# Patient Record
Sex: Female | Born: 1935
Health system: Southern US, Community
[De-identification: ages and names within clinical notes are randomized; demographics above are authoritative.]

## PROBLEM LIST (undated history)

## (undated) DIAGNOSIS — I8390 Asymptomatic varicose veins of unspecified lower extremity: Secondary | ICD-10-CM

## (undated) DIAGNOSIS — K449 Diaphragmatic hernia without obstruction or gangrene: Secondary | ICD-10-CM

## (undated) DIAGNOSIS — R0989 Other specified symptoms and signs involving the circulatory and respiratory systems: Secondary | ICD-10-CM

## (undated) DIAGNOSIS — I82812 Embolism and thrombosis of superficial veins of left lower extremities: Secondary | ICD-10-CM

## (undated) DIAGNOSIS — Z86718 Personal history of other venous thrombosis and embolism: Secondary | ICD-10-CM

## (undated) DIAGNOSIS — D689 Coagulation defect, unspecified: Secondary | ICD-10-CM

## (undated) DIAGNOSIS — R1319 Other dysphagia: Secondary | ICD-10-CM

## (undated) DIAGNOSIS — N185 Chronic kidney disease, stage 5: Secondary | ICD-10-CM

## (undated) DIAGNOSIS — E119 Type 2 diabetes mellitus without complications: Secondary | ICD-10-CM

## (undated) DIAGNOSIS — K5790 Diverticulosis of intestine, part unspecified, without perforation or abscess without bleeding: Secondary | ICD-10-CM

## (undated) DIAGNOSIS — IMO0002 Reserved for concepts with insufficient information to code with codable children: Secondary | ICD-10-CM

## (undated) DIAGNOSIS — M1990 Unspecified osteoarthritis, unspecified site: Secondary | ICD-10-CM

## (undated) DIAGNOSIS — J45909 Unspecified asthma, uncomplicated: Secondary | ICD-10-CM

## (undated) DIAGNOSIS — R55 Syncope and collapse: Secondary | ICD-10-CM

## (undated) DIAGNOSIS — R0600 Dyspnea, unspecified: Secondary | ICD-10-CM

## (undated) DIAGNOSIS — F419 Anxiety disorder, unspecified: Secondary | ICD-10-CM

## (undated) DIAGNOSIS — I10 Essential (primary) hypertension: Secondary | ICD-10-CM

## (undated) DIAGNOSIS — I83899 Varicose veins of unspecified lower extremities with other complications: Secondary | ICD-10-CM

## (undated) DIAGNOSIS — R51 Headache: Secondary | ICD-10-CM

## (undated) DIAGNOSIS — D649 Anemia, unspecified: Secondary | ICD-10-CM

## (undated) DIAGNOSIS — N189 Chronic kidney disease, unspecified: Secondary | ICD-10-CM

## (undated) DIAGNOSIS — M329 Systemic lupus erythematosus, unspecified: Secondary | ICD-10-CM

## (undated) DIAGNOSIS — E785 Hyperlipidemia, unspecified: Secondary | ICD-10-CM

## (undated) DIAGNOSIS — K219 Gastro-esophageal reflux disease without esophagitis: Secondary | ICD-10-CM

## (undated) DIAGNOSIS — H269 Unspecified cataract: Secondary | ICD-10-CM

## (undated) DIAGNOSIS — T7840XA Allergy, unspecified, initial encounter: Secondary | ICD-10-CM

## (undated) HISTORY — PX: CARPAL TUNNEL RELEASE: SHX101

## (undated) HISTORY — DX: Embolism and thrombosis of superficial veins of left lower extremity: I82.812

## (undated) HISTORY — PX: COLONOSCOPY: SHX174

## (undated) HISTORY — PX: UPPER GASTROINTESTINAL ENDOSCOPY: SHX188

## (undated) HISTORY — DX: Syncope and collapse: R55

## (undated) HISTORY — DX: Unspecified asthma, uncomplicated: J45.909

## (undated) HISTORY — DX: Gastro-esophageal reflux disease without esophagitis: K21.9

## (undated) HISTORY — DX: Varicose veins of unspecified lower extremity with other complications: I83.899

## (undated) HISTORY — DX: Unspecified cataract: H26.9

## (undated) HISTORY — DX: Essential (primary) hypertension: I10

## (undated) HISTORY — PX: SHOULDER OPEN ROTATOR CUFF REPAIR: SHX2407

## (undated) HISTORY — DX: Dyspnea, unspecified: R06.00

## (undated) HISTORY — PX: BREAST SURGERY: SHX581

## (undated) HISTORY — DX: Other specified symptoms and signs involving the circulatory and respiratory systems: R09.89

## (undated) HISTORY — DX: Chronic kidney disease, unspecified: N18.9

## (undated) HISTORY — DX: Diverticulosis of intestine, part unspecified, without perforation or abscess without bleeding: K57.90

## (undated) HISTORY — DX: Coagulation defect, unspecified: D68.9

## (undated) HISTORY — DX: Diaphragmatic hernia without obstruction or gangrene: K44.9

## (undated) HISTORY — PX: ABDOMINAL HYSTERECTOMY: SHX81

## (undated) HISTORY — DX: Other dysphagia: R13.19

## (undated) HISTORY — PX: BREAST EXCISIONAL BIOPSY: SUR124

## (undated) HISTORY — DX: Hyperlipidemia, unspecified: E78.5

## (undated) HISTORY — DX: Allergy, unspecified, initial encounter: T78.40XA

## (undated) HISTORY — DX: Personal history of other venous thrombosis and embolism: Z86.718

---

## 1997-10-14 ENCOUNTER — Ambulatory Visit (HOSPITAL_COMMUNITY): Admission: RE | Admit: 1997-10-14 | Discharge: 1997-10-14 | Payer: Self-pay | Admitting: Family Medicine

## 1997-11-02 ENCOUNTER — Ambulatory Visit (HOSPITAL_COMMUNITY): Admission: RE | Admit: 1997-11-02 | Discharge: 1997-11-02 | Payer: Self-pay | Admitting: Family Medicine

## 1997-12-30 ENCOUNTER — Ambulatory Visit (HOSPITAL_COMMUNITY): Admission: RE | Admit: 1997-12-30 | Discharge: 1997-12-30 | Payer: Self-pay | Admitting: Family Medicine

## 1998-06-24 ENCOUNTER — Ambulatory Visit (HOSPITAL_COMMUNITY): Admission: RE | Admit: 1998-06-24 | Discharge: 1998-06-24 | Payer: Self-pay | Admitting: Family Medicine

## 1998-12-22 ENCOUNTER — Ambulatory Visit (HOSPITAL_COMMUNITY): Admission: RE | Admit: 1998-12-22 | Discharge: 1998-12-22 | Payer: Self-pay | Admitting: Family Medicine

## 1999-05-27 ENCOUNTER — Ambulatory Visit (HOSPITAL_COMMUNITY): Admission: RE | Admit: 1999-05-27 | Discharge: 1999-05-27 | Payer: Self-pay | Admitting: Family Medicine

## 1999-05-27 ENCOUNTER — Encounter: Payer: Self-pay | Admitting: Family Medicine

## 2000-06-26 ENCOUNTER — Ambulatory Visit (HOSPITAL_COMMUNITY): Admission: RE | Admit: 2000-06-26 | Discharge: 2000-06-26 | Payer: Self-pay | Admitting: Family Medicine

## 2000-07-14 ENCOUNTER — Inpatient Hospital Stay (HOSPITAL_COMMUNITY): Admission: AD | Admit: 2000-07-14 | Discharge: 2000-07-14 | Payer: Self-pay | Admitting: *Deleted

## 2001-01-29 ENCOUNTER — Encounter: Payer: Self-pay | Admitting: Family Medicine

## 2001-01-29 ENCOUNTER — Ambulatory Visit (HOSPITAL_COMMUNITY): Admission: RE | Admit: 2001-01-29 | Discharge: 2001-01-29 | Payer: Self-pay | Admitting: Family Medicine

## 2001-08-08 ENCOUNTER — Encounter: Admission: RE | Admit: 2001-08-08 | Discharge: 2001-08-08 | Payer: Self-pay | Admitting: Family Medicine

## 2001-08-08 ENCOUNTER — Encounter: Payer: Self-pay | Admitting: Family Medicine

## 2001-11-23 ENCOUNTER — Emergency Department (HOSPITAL_COMMUNITY): Admission: EM | Admit: 2001-11-23 | Discharge: 2001-11-23 | Payer: Self-pay | Admitting: Emergency Medicine

## 2001-11-29 ENCOUNTER — Ambulatory Visit (HOSPITAL_COMMUNITY): Admission: RE | Admit: 2001-11-29 | Discharge: 2001-11-29 | Payer: Self-pay | Admitting: Family Medicine

## 2002-09-24 ENCOUNTER — Encounter: Admission: RE | Admit: 2002-09-24 | Discharge: 2002-09-24 | Payer: Self-pay | Admitting: Family Medicine

## 2002-09-24 ENCOUNTER — Encounter: Payer: Self-pay | Admitting: Family Medicine

## 2002-10-24 ENCOUNTER — Encounter: Payer: Self-pay | Admitting: Pulmonary Disease

## 2002-10-24 ENCOUNTER — Ambulatory Visit (HOSPITAL_COMMUNITY): Admission: RE | Admit: 2002-10-24 | Discharge: 2002-10-24 | Payer: Self-pay | Admitting: Pulmonary Disease

## 2002-12-12 ENCOUNTER — Encounter: Payer: Self-pay | Admitting: Pulmonary Disease

## 2002-12-12 ENCOUNTER — Ambulatory Visit (HOSPITAL_COMMUNITY): Admission: RE | Admit: 2002-12-12 | Discharge: 2002-12-12 | Payer: Self-pay | Admitting: Pulmonary Disease

## 2002-12-18 ENCOUNTER — Encounter: Payer: Self-pay | Admitting: Pulmonary Disease

## 2002-12-18 ENCOUNTER — Ambulatory Visit (HOSPITAL_COMMUNITY): Admission: RE | Admit: 2002-12-18 | Discharge: 2002-12-18 | Payer: Self-pay | Admitting: Pulmonary Disease

## 2003-03-20 ENCOUNTER — Ambulatory Visit (HOSPITAL_COMMUNITY): Admission: RE | Admit: 2003-03-20 | Discharge: 2003-03-20 | Payer: Self-pay | Admitting: *Deleted

## 2003-10-16 ENCOUNTER — Encounter: Admission: RE | Admit: 2003-10-16 | Discharge: 2003-10-16 | Payer: Self-pay | Admitting: Pulmonary Disease

## 2004-04-28 ENCOUNTER — Emergency Department (HOSPITAL_COMMUNITY): Admission: EM | Admit: 2004-04-28 | Discharge: 2004-04-28 | Payer: Self-pay | Admitting: Family Medicine

## 2004-07-05 ENCOUNTER — Ambulatory Visit (HOSPITAL_COMMUNITY): Admission: RE | Admit: 2004-07-05 | Discharge: 2004-07-05 | Payer: Self-pay | Admitting: Pulmonary Disease

## 2004-10-21 ENCOUNTER — Ambulatory Visit (HOSPITAL_COMMUNITY): Admission: RE | Admit: 2004-10-21 | Discharge: 2004-10-21 | Payer: Self-pay | Admitting: Pulmonary Disease

## 2004-10-27 ENCOUNTER — Encounter: Admission: RE | Admit: 2004-10-27 | Discharge: 2004-10-27 | Payer: Self-pay | Admitting: Pulmonary Disease

## 2005-10-10 ENCOUNTER — Ambulatory Visit (HOSPITAL_COMMUNITY): Admission: RE | Admit: 2005-10-10 | Discharge: 2005-10-10 | Payer: Self-pay | Admitting: Pulmonary Disease

## 2005-10-11 ENCOUNTER — Emergency Department (HOSPITAL_COMMUNITY): Admission: EM | Admit: 2005-10-11 | Discharge: 2005-10-11 | Payer: Self-pay | Admitting: Family Medicine

## 2005-10-24 ENCOUNTER — Ambulatory Visit (HOSPITAL_COMMUNITY): Admission: RE | Admit: 2005-10-24 | Discharge: 2005-10-24 | Payer: Self-pay | Admitting: Pulmonary Disease

## 2005-10-24 ENCOUNTER — Encounter: Payer: Self-pay | Admitting: Vascular Surgery

## 2005-11-03 ENCOUNTER — Encounter: Admission: RE | Admit: 2005-11-03 | Discharge: 2005-11-03 | Payer: Self-pay | Admitting: Pulmonary Disease

## 2006-10-15 ENCOUNTER — Encounter (INDEPENDENT_AMBULATORY_CARE_PROVIDER_SITE_OTHER): Payer: Self-pay | Admitting: Pulmonary Disease

## 2006-10-15 ENCOUNTER — Ambulatory Visit: Payer: Self-pay | Admitting: Vascular Surgery

## 2006-10-15 ENCOUNTER — Ambulatory Visit (HOSPITAL_COMMUNITY): Admission: RE | Admit: 2006-10-15 | Discharge: 2006-10-15 | Payer: Self-pay | Admitting: Pulmonary Disease

## 2006-12-17 ENCOUNTER — Encounter: Admission: RE | Admit: 2006-12-17 | Discharge: 2006-12-17 | Payer: Self-pay | Admitting: Pulmonary Disease

## 2007-06-13 ENCOUNTER — Encounter (INDEPENDENT_AMBULATORY_CARE_PROVIDER_SITE_OTHER): Payer: Self-pay | Admitting: Pulmonary Disease

## 2007-06-13 ENCOUNTER — Ambulatory Visit (HOSPITAL_COMMUNITY): Admission: RE | Admit: 2007-06-13 | Discharge: 2007-06-13 | Payer: Self-pay | Admitting: Pulmonary Disease

## 2007-09-02 ENCOUNTER — Emergency Department (HOSPITAL_COMMUNITY): Admission: EM | Admit: 2007-09-02 | Discharge: 2007-09-02 | Payer: Self-pay | Admitting: Emergency Medicine

## 2007-12-20 ENCOUNTER — Encounter: Admission: RE | Admit: 2007-12-20 | Discharge: 2007-12-20 | Payer: Self-pay | Admitting: Pulmonary Disease

## 2008-06-17 ENCOUNTER — Inpatient Hospital Stay (HOSPITAL_COMMUNITY): Admission: RE | Admit: 2008-06-17 | Discharge: 2008-06-18 | Payer: Self-pay | Admitting: Internal Medicine

## 2008-12-22 ENCOUNTER — Encounter: Admission: RE | Admit: 2008-12-22 | Discharge: 2008-12-22 | Payer: Self-pay | Admitting: Pulmonary Disease

## 2009-08-27 ENCOUNTER — Encounter: Admission: RE | Admit: 2009-08-27 | Discharge: 2009-08-27 | Payer: Self-pay | Admitting: Gastroenterology

## 2009-09-14 ENCOUNTER — Emergency Department (HOSPITAL_COMMUNITY): Admission: EM | Admit: 2009-09-14 | Discharge: 2009-09-14 | Payer: Self-pay | Admitting: Emergency Medicine

## 2009-09-15 ENCOUNTER — Ambulatory Visit: Payer: Self-pay | Admitting: Vascular Surgery

## 2009-09-15 ENCOUNTER — Encounter (INDEPENDENT_AMBULATORY_CARE_PROVIDER_SITE_OTHER): Payer: Self-pay | Admitting: Emergency Medicine

## 2009-09-15 ENCOUNTER — Observation Stay (HOSPITAL_COMMUNITY): Admission: EM | Admit: 2009-09-15 | Discharge: 2009-09-15 | Payer: Self-pay | Admitting: Emergency Medicine

## 2009-10-22 ENCOUNTER — Ambulatory Visit (HOSPITAL_COMMUNITY): Admission: RE | Admit: 2009-10-22 | Discharge: 2009-10-22 | Payer: Self-pay | Admitting: Pulmonary Disease

## 2009-11-22 ENCOUNTER — Encounter (INDEPENDENT_AMBULATORY_CARE_PROVIDER_SITE_OTHER): Payer: Self-pay | Admitting: Pulmonary Disease

## 2009-11-22 ENCOUNTER — Ambulatory Visit: Payer: Self-pay | Admitting: Vascular Surgery

## 2009-11-22 ENCOUNTER — Ambulatory Visit: Admission: RE | Admit: 2009-11-22 | Discharge: 2009-11-22 | Payer: Self-pay | Admitting: Pulmonary Disease

## 2009-12-28 ENCOUNTER — Inpatient Hospital Stay (HOSPITAL_COMMUNITY): Admission: RE | Admit: 2009-12-28 | Discharge: 2009-12-30 | Payer: Self-pay | Admitting: Orthopedic Surgery

## 2009-12-30 ENCOUNTER — Ambulatory Visit: Payer: Self-pay | Admitting: Vascular Surgery

## 2009-12-30 ENCOUNTER — Encounter (INDEPENDENT_AMBULATORY_CARE_PROVIDER_SITE_OTHER): Payer: Self-pay | Admitting: Family Medicine

## 2010-05-31 ENCOUNTER — Encounter
Admission: RE | Admit: 2010-05-31 | Discharge: 2010-05-31 | Payer: Self-pay | Source: Home / Self Care | Attending: Pulmonary Disease | Admitting: Pulmonary Disease

## 2010-06-05 ENCOUNTER — Encounter: Payer: Self-pay | Admitting: Pulmonary Disease

## 2010-07-29 LAB — CBC
HCT: 35.9 % — ABNORMAL LOW (ref 36.0–46.0)
Hemoglobin: 11.8 g/dL — ABNORMAL LOW (ref 12.0–15.0)
Platelets: 222 10*3/uL (ref 150–400)
RBC: 4.24 MIL/uL (ref 3.87–5.11)
RDW: 14.4 % (ref 11.5–15.5)

## 2010-07-29 LAB — PROTIME-INR: INR: 0.94 (ref 0.00–1.49)

## 2010-07-29 LAB — URINALYSIS, ROUTINE W REFLEX MICROSCOPIC
Bilirubin Urine: NEGATIVE
Glucose, UA: NEGATIVE mg/dL
Ketones, ur: NEGATIVE mg/dL
Specific Gravity, Urine: 1.009 (ref 1.005–1.030)
pH: 6 (ref 5.0–8.0)

## 2010-07-29 LAB — URINE CULTURE
Colony Count: NO GROWTH
Culture  Setup Time: 201108102108

## 2010-07-29 LAB — BASIC METABOLIC PANEL
BUN: 14 mg/dL (ref 6–23)
Chloride: 101 mEq/L (ref 96–112)

## 2010-07-29 LAB — DIFFERENTIAL
Lymphocytes Relative: 27 % (ref 12–46)
Neutro Abs: 3.4 10*3/uL (ref 1.7–7.7)

## 2010-07-29 LAB — SURGICAL PCR SCREEN: MRSA, PCR: NEGATIVE

## 2010-08-02 LAB — POCT I-STAT, CHEM 8
BUN: 17 mg/dL (ref 6–23)
Calcium, Ion: 1.24 mmol/L (ref 1.12–1.32)
Chloride: 104 mEq/L (ref 96–112)
Creatinine, Ser: 1.1 mg/dL (ref 0.4–1.2)
Glucose, Bld: 111 mg/dL — ABNORMAL HIGH (ref 70–99)
Sodium: 144 mEq/L (ref 135–145)

## 2010-08-29 LAB — CBC
HCT: 38.5 % (ref 36.0–46.0)
MCHC: 32.5 g/dL (ref 30.0–36.0)
Platelets: 204 10*3/uL (ref 150–400)
RBC: 4.35 MIL/uL (ref 3.87–5.11)
WBC: 5.1 10*3/uL (ref 4.0–10.5)

## 2010-08-29 LAB — BASIC METABOLIC PANEL: Chloride: 101 mEq/L (ref 96–112)

## 2010-09-27 NOTE — Op Note (Signed)
NAME:  [name], [name]              ACCOUNT NO.:  [id number]   MEDICAL RECORD NO.:  [id number]          PATIENT TYPE:  AMB   LOCATION:  SDS                          FACILITY:  [facility]   PHYSICIAN:  Anderson Malta, M.D.    DATE OF BIRTH:  06/03/1933   DATE OF PROCEDURE:  DATE OF DISCHARGE:                               OPERATIVE REPORT   PREOPERATIVE DIAGNOSES:  Right shoulder bursitis, rotator cuff tear,  synovitis.   POSTOPERATIVE DIAGNOSES:  Right shoulder bursitis, rotator cuff tear,  synovitis.   PROCEDURE:  Right shoulder diagnostic arthroscopy with limited  intraarticular debridement of rotator cuff tear, labral fraying, and  early synovitis within rotator cuff space and superior to the biceps  anchor with some debridement of some anterior labral degeneration and  subacromial decompression, mini open rotator cuff repair.   SURGEON:  Anderson Malta, M.D.   ASSISTANT:  None.   ANESTHESIA:  General endotracheal.   ESTIMATED BLOOD LOSS:  Minimal.   INDICATIONS:  Mr. [name] is a 77 year old female with persistent  right shoulder pain which has failed nonoperative management.  She  presents now for operative therapy of rotator cuff tear and bursitis  after explaining risks and benefits.   OPERATIVE FINDINGS:  1. Examination under anesthesia, range of motion, forward flexion 170,      external rotation 15 degrees, abduction 70, isolated glenohumeral      abduction 120.  2. Diagnostic operative arthroscopy.      a.     Frayed tissue around the anterior aspect of the biceps       anchor with stable biceps attachment to the glenoid.      b.     Early synovitis superior to the biceps anchor within rotator       interval.      c.     Intact biceps tendon.      d.     Cyst within superior aspect of the intrarticular       subscapularis tendon.      e.     Subacromial bursitis and AC inferior joint osteophytes and       type 2 acromion.      f.     A 2 cm x 1.5 cm rotator  cuff tear of the bleeding edges of       supraspinatus.   PROCEDURE IN DETAIL:  The patient was brought to the operative room  where general endotracheal anesthesia was induced.  Perioperative  antibiotics were administered.  A time-out was called.  The patient was  placed in beach chair position with head in the neutral position.  Right  shoulder was prescrubbed with alcohol and Betadine which allowed to air  dry, and then the entire arm with shoulder was prepped with DuraPrep  solution.  The topographic anatomy showed a dense line including  posterolateral, negative margins, and acromion as well as coracoid  process.  Charlie Pitter was used to cover the axilla.  A solution of saline and  epinephrine was injected in the subacromial space.  Posterior portal was  then created at  2-cm medial and inferior to the posterior margin of  acromion.  Diagnostic arthroscopy was performed.  The patient did have  some fraying and synovitis around the biceps anchor.  This was debrided  with the shaver and ArthroCare wand.  The glenohumeral articular surface  was intact, biceps anchor was intact.  The patient did have some cyst  within the superior aspect of the intraarticular subscapularis.  This  was decompressed after creation of an anterior portal.  Under direct  visualization, limited debridement was performed of the rotator cuff  tear, the supraspinatus as well as the labral degeneration and synovitis  within rotator cuff interval.  At this time, the scope was placed into  the subacromial space.  Anterior portal was then created in line with  the anticipated location of the incision for mini open rotator cuff  repair.  Bursectomy with subacromial decompression was performed with co-  planing of the clavicle.  Rotator cuff tear was again visualized.   At this time, following subacromial decompression, the instruments were  removed.  The anterior and posterior portals were closed using 3-0 nylon  suture.   Shoulder was reprepped with DuraPrep and an India was used to  cover the rest of the shoulder.  The incision was then made measuring  2.5 cm.  Deltoid was split, measured distance 3-cm from anterolateral  margin of the acromion.  Subacromial decompression was adequate to  manual palpation.  The rotator cuff tear was visualized.  The edges were  fashioned with #15 blade, footprint was fashioned with the curette.  The  watertight repair was then achieved on the rotator cuff tear using by  first closing the V with inverted 2-0 FiberWire suture, then using two  corkscrews and two PushLocks to obtain a watertight repair and pressed  the rotator cuff down to the footprint of the greater tuberosity.  Obtained good range of motion and found to have no grinding or crepitus.  At this time, the incision was thoroughly irrigated.  Deltoid split was  closed using 0 Vicryl suture followed by interrupted inverted 2-0 Vicryl  suture and running 3-0, 4-0 Prolene.  Bulky dressing was then applied to  the shoulder along with shoulder immobilizer.  The patient tolerated the  procedure well without immediate complications.  She was then extubated  and transferred to recovery room.      Anderson Malta, M.D.  Electronically Signed     GSD/MEDQ  D:  06/16/2008  T:  06/17/2008  Job:  [id number]

## 2010-09-30 NOTE — Op Note (Signed)
   NAME:  [name], [name]                        ACCOUNT NO.:  [id number]   MEDICAL RECORD NO.:  [id number]                   PATIENT TYPE:  AMB   LOCATION:  ENDO                                 [facility]ACILITY[facility]  [facility]   PHYSICIAN:  Waverly Ferrari, M.D.                 DATE OF BIRTH:  02-24-1934   DATE OF PROCEDURE:  DATE OF DISCHARGE:                                 OPERATIVE REPORT   PROCEDURE:  Colonoscopy.   INDICATIONS FOR PROCEDURE:  Question of colon polyp seen on barium enema.   ANESTHESIA:  Demerol 25, Versed 4 mg.   DESCRIPTION OF PROCEDURE:  With the patient mildly sedated in the left  lateral decubitus position subsequently rolled to her back to the her right  side and back again in a number of different positions with pressure applied  to the abdomen. The Olympus videoscopic colonoscope was inserted in the  rectum and passed under direct vision to the ascending colon just above the  ileocecal valve. The cecum could be partially visualized and it appeared  normal and after many attempts to try to actually get the endoscope to  advance into the cecum finally felt this was not going to be possible due to  adhesions and looping in the pelvis which could not be reduce. Therefore and  since the patient had already had a barium enema that showed this area to be  negative, I decided to withdraw the colonoscope taking circumferential views  of the remaining colonic mucosa stopping only in the rectum which appeared  normal on direct and showed hemorrhoids on retroflexed view. The endoscope  was straightened and withdraw. The patient's vital signs and pulse oximeter  remained stable. The patient tolerated the procedure well without apparent  complications.   FINDINGS:  At this point no polyps or filling defects were seen. There were  some diverticula seen in the sigmoid colon and distal colon which may have  been what we had seen on barium enema. There were no polyps or other  abnormalities noted on the exam as outlined above.   PLAN:  Possibly repeat examination in 5-10 years.                                               Waverly Ferrari, M.D.    GMO/MEDQ  D:  03/20/2003  T:  03/20/2003  Job:  [id number]   cc:   Leola Brazil., M.D.  [place]. [address] [place]  [place] [address]  Fax: [phone]

## 2010-09-30 NOTE — Op Note (Signed)
   NAME:  [name], [name]                        ACCOUNT NO.:  [id number]   MEDICAL RECORD NO.:  [id number]                   PATIENT TYPE:  AMB   LOCATION:  ENDO                                 [facility]ACILITY:  [facility]   PHYSICIAN:  Waverly Ferrari, M.D.                 DATE OF BIRTH:  1933-06-05   DATE OF PROCEDURE:  03/20/2003  DATE OF DISCHARGE:                                 OPERATIVE REPORT   PROCEDURE:  Upper endoscopy.   INDICATIONS:  Gastroesophageal reflux disease.   ANESTHESIA:  Demerol 50 mg, Versed 5 mg.   DESCRIPTION OF PROCEDURE:  With the patient mildly sedated in the left  lateral decubitus position, the Olympus videoscopic endoscope was inserted  in the mouth and passed under direct vision through the esophagus, which  appeared normal.  There was no evidence of Barrett's esophagus. We entered  into the stomach.  The  fundus, body, antrum, duodenal bulb, and second  portion of the duodenum all appeared normal.  From this point, the endoscope  was slowly withdrawn taking circumferential views of the duodenal mucosa  until the endoscope then pulled back into the stomach, placed in  retroflexion and viewed the stomach from below.  The endoscope was then  straightened, withdrawn, taking circumferential views of the remaining  gastric and esophageal mucosa.  The patient's vital signs and pulse oximetry  remained stable.  The patient tolerated the procedure well without apparent  complication.   FINDINGS:  Unremarkable examination.   PLAN:  Proceed to colonoscopy                                               Waverly Ferrari, M.D.    GMO/MEDQ  D:  03/20/2003  T:  03/20/2003  Job:  [id number]   cc:   Leola Brazil., M.D.  [place]. [address] [place]  [place] [address]  Fax: [phone]

## 2010-09-30 NOTE — Discharge Summary (Signed)
NAME:  CYNNAMON, KENTER              ACCOUNT NO.:  [id number]   MEDICAL RECORD NO.:  [id number]          PATIENT TYPE:  INP   LOCATION:  [id number]                         FACILITY:  [facility]   PHYSICIAN:  Anderson Malta, M.D.    DATE OF BIRTH:  Jul 13, 1933   DATE OF ADMISSION:  06/16/2008  DATE OF DISCHARGE:  06/18/2008                               DISCHARGE SUMMARY   ADMISSION DIAGNOSES:  1. Right shoulder bursitis, rotator cuff tear, and synovitis.  2. Asthma.  3. Hypertension.   DISCHARGE DIAGNOSES:  1. Right shoulder bursitis, rotator cuff tear, and synovitis.  2. Asthma.  3. Hypertension.   PROCEDURE:  On June 16, 2008, the patient underwent right shoulder  diagnostic arthroscopy with limited intra-articular debridement of  rotator cuff tear, labral fraying and early synovitis within rotator  cuff space and superior to the biceps anchor with some debridement of  anterior labral degeneration and subacromial decompression.  Many open  rotator cuff repair.  This was performed by Dr. Marlou Sa under general  anesthesia.   CONSULTATIONS:  None.   BRIEF HISTORY:  The patient is a 77 year old female with persistent  right shoulder pain failing nonoperative management.  She presents for a  rotator cuff repair via right shoulder arthroscopy.   BRIEF HOSPITAL COURSE:  The patient tolerated the procedure under  general anesthesia without complications.  Postoperatively, physical  therapy assisted in ambulation and gait training and she was independent  in ambulation.  Occupational therapy assisted with ADL and sling  education, as well as pendulum exercises, active range of motion of the  elbow, wrist, and hand, and active assisted range of motion, forward  flexion without limitations of the right upper extremity.  Sling was  removed for occupational therapy and ADL exercises but worn otherwise  full time.  Dressing changed on the second postoperative day prior to  discharge, and her wound  was without drainage.  Pain was initially  treated with IV analgesics, and she was gradually weaned to Vicodin for  her discomfort.  The patient was taking a regular diet prior to  discharge.  She was afebrile, and vital signs were stable.   PERTINENT LABORATORY VALUES ON ADMISSION:  CBC within normal limits.  Chemistry studies within normal limits.   PLAN:  The patient was discharged to her home.  Family will assist with  her care.  She was given a medication reconciliation form with  instructions to continue home medications as taken prior to admission  and given prescriptions for Vicodin 1 every 4-6 hours as needed for pain  and Robaxin 500 mg 1 every 8 hours as needed for spasm.  She will keep  her wound dry and clean, which is covered with Mepilex at the time of  discharge.  She will  wear her sling at all times with the exception of therapeutic exercises  2 hours per day.  The patient will follow up with Dr. Marlou Sa on June 22, 2008.  All questions encouraged and answered.   CONDITION ON DISCHARGE:  Stable.      Epimenio Foot, P.A.  Anderson Malta, M.D.  Electronically Signed    SMV/MEDQ  D:  09/03/2008  T:  09/03/2008  Job:  [id number]

## 2010-10-17 ENCOUNTER — Ambulatory Visit (HOSPITAL_COMMUNITY)
Admission: RE | Admit: 2010-10-17 | Discharge: 2010-10-17 | Disposition: A | Discharge status: Home / Self Care | Payer: Medicare Other | Source: Ambulatory Visit | Attending: Pulmonary Disease | Admitting: Pulmonary Disease

## 2010-10-17 ENCOUNTER — Other Ambulatory Visit: Payer: Self-pay | Admitting: Pulmonary Disease

## 2010-10-17 DIAGNOSIS — M519 Unspecified thoracic, thoracolumbar and lumbosacral intervertebral disc disorder: Secondary | ICD-10-CM | POA: Insufficient documentation

## 2010-10-17 DIAGNOSIS — M47817 Spondylosis without myelopathy or radiculopathy, lumbosacral region: Secondary | ICD-10-CM | POA: Insufficient documentation

## 2010-10-17 DIAGNOSIS — M159 Polyosteoarthritis, unspecified: Secondary | ICD-10-CM

## 2010-10-17 DIAGNOSIS — M545 Low back pain, unspecified: Secondary | ICD-10-CM | POA: Insufficient documentation

## 2010-10-18 ENCOUNTER — Ambulatory Visit (HOSPITAL_COMMUNITY): Payer: Medicare Other

## 2010-10-19 ENCOUNTER — Ambulatory Visit (HOSPITAL_COMMUNITY)
Admission: RE | Admit: 2010-10-19 | Discharge: 2010-10-19 | Disposition: A | Discharge status: Home / Self Care | Payer: Medicare Other | Source: Ambulatory Visit | Attending: Pulmonary Disease | Admitting: Pulmonary Disease

## 2010-10-19 DIAGNOSIS — M79609 Pain in unspecified limb: Secondary | ICD-10-CM | POA: Insufficient documentation

## 2010-10-19 DIAGNOSIS — M7989 Other specified soft tissue disorders: Secondary | ICD-10-CM | POA: Insufficient documentation

## 2010-10-19 DIAGNOSIS — I82890 Acute embolism and thrombosis of other specified veins: Secondary | ICD-10-CM | POA: Insufficient documentation

## 2010-12-05 ENCOUNTER — Encounter: Payer: Self-pay | Admitting: Vascular Surgery

## 2010-12-05 DIAGNOSIS — I83899 Varicose veins of unspecified lower extremities with other complications: Secondary | ICD-10-CM

## 2010-12-05 HISTORY — DX: Varicose veins of unspecified lower extremity with other complications: I83.899

## 2010-12-26 ENCOUNTER — Encounter: Payer: Self-pay | Admitting: Vascular Surgery

## 2011-01-02 ENCOUNTER — Ambulatory Visit (INDEPENDENT_AMBULATORY_CARE_PROVIDER_SITE_OTHER): Payer: Medicare Other | Admitting: Vascular Surgery

## 2011-01-02 ENCOUNTER — Encounter (INDEPENDENT_AMBULATORY_CARE_PROVIDER_SITE_OTHER): Payer: Medicare Other

## 2011-01-02 VITALS — BP 151/62 | HR 63 | Resp 20 | Ht 62.0 in | Wt 185.0 lb

## 2011-01-02 DIAGNOSIS — I83893 Varicose veins of bilateral lower extremities with other complications: Secondary | ICD-10-CM

## 2011-01-02 NOTE — Progress Notes (Deleted)
Subjective:     Patient ID: [name], female   DOB: Jan 15, 1934, 77 y.o.   MRN: [id number]  HPI   Review of Systems  Constitutional: Negative.   HENT: Negative.   Eyes: Negative.   Respiratory: Positive for shortness of breath.   Cardiovascular: Negative.   Gastrointestinal: Negative.   Genitourinary: Negative.   Musculoskeletal: Negative.   Skin: Negative.   Neurological: Negative.   Hematological: Negative.   Psychiatric/Behavioral: Negative.        Objective:   Physical Exam     Assessment:     ***    Plan:     ***

## 2011-01-02 NOTE — Progress Notes (Signed)
Subjective:     Patient ID: [name], female   DOB: [birth year], 77 y.o.   MRN: [id number]  HPI this 77 year old female was referred for venous evaluation. She has a history of thrombophlebitis in the right leg on 2 occasions. About one year ago apparently she did have what sounds like thrombophlebitis in her right calf and was treated with anticoagulants-heparin and Coumadin. She is currently not on Coumadin. She has a remote history of a DVT in one of her legs and was treated with anticoagulants. She does have swelling in both ankles as the day progresses and is not were last compression stockings nor elevate her leg. She has no history of stasis ulcers bleeding bulging varicosities.  Past Medical History  Diagnosis Date  . Asthma   . History of DVT (deep vein thrombosis)   . Allergy   . GERD (gastroesophageal reflux disease)   . Hyperlipidemia   . Hypertension     History  Substance Use Topics  . Smoking status: Never Smoker   . Smokeless tobacco: Not on file  . Alcohol Use: No    No family history on file.  Allergies  Allergen Reactions  . Aspirin   . Penicillins     Current outpatient prescriptions:albuterol (PROVENTIL) (2.5 MG/3ML) 0.083% nebulizer solution, Take 2.5 mg by nebulization every 6 (six) hours as needed. 1-2 puffs as needed , Disp: , Rfl: ;  clopidogrel (PLAVIX) 75 MG tablet, Take 75 mg by mouth daily.  , Disp: , Rfl: ;  famotidine (PEPCID) 40 MG tablet, Take 40 mg by mouth daily.  , Disp: , Rfl: ;  fenofibrate 160 MG tablet, Take 160 mg by mouth daily.  , Disp: , Rfl:  ipratropium (ATROVENT) 0.02 % nebulizer solution, Take 500 mcg by nebulization every 8 (eight) hours as needed. For spasm , Disp: , Rfl: ;  losartan-hydrochlorothiazide (HYZAAR) 100-25 MG per tablet, Take 1 tablet by mouth daily.  , Disp: , Rfl: ;  metoprolol (LOPRESSOR) 50 MG tablet, Take 50 mg by mouth 1 day or 1 dose.  , Disp: , Rfl: ;  montelukast (SINGULAIR) 10 MG tablet, Take 10 mg by mouth  1 day or 1 dose.  , Disp: , Rfl:  verapamil (COVERA HS) 240 MG (CO) 24 hr tablet, Take 240 mg by mouth at bedtime.  , Disp: , Rfl: ;  hydrochlorothiazide 25 MG tablet, Take 25 mg by mouth daily.  , Disp: , Rfl: ;  HYDROcodone-acetaminophen (NORCO) 10-325 MG per tablet, Take 1 tablet by mouth every 6 (six) hours as needed. One half to 2 tabs po q 6h prn pain , Disp: , Rfl:   Filed Vitals:   01/02/11 1449  Height: 5\' 2"  (1.575 m)  Weight: 185 lb (83.915 kg)    Body mass index is 33.84 kg/(m^2).         Review of Systems  Constitutional: Negative for fever, chills, activity change, appetite change, fatigue and unexpected weight change.  HENT: Negative for hearing loss, congestion, sore throat, trouble swallowing, neck pain and voice change.   Eyes: Negative for visual disturbance.  Respiratory: Positive for shortness of breath and wheezing. Negative for cough, chest tightness and stridor.   Cardiovascular: Negative for chest pain, palpitations and leg swelling.  Gastrointestinal: Negative for nausea, abdominal pain, diarrhea, constipation, blood in stool and abdominal distention.  Genitourinary: Negative for dysuria, urgency, frequency, hematuria and difficulty urinating.  Musculoskeletal: Negative for back pain, joint swelling and gait problem.  Skin: Negative for  color change, pallor, rash and wound.  Neurological: Negative for dizziness, seizures, syncope, facial asymmetry, speech difficulty, weakness, light-headedness, numbness and headaches.  Hematological: Negative for adenopathy. Does not bruise/bleed easily.  Psychiatric/Behavioral: Negative for confusion.       Objective:   Physical Exam  Constitutional: She is oriented to person, place, and time. She appears well-developed and well-nourished.  HENT:  Head: Normocephalic.  Nose: Nose normal.  Mouth/Throat: Oropharynx is clear and moist.  Eyes: EOM are normal. Pupils are equal, round, and reactive to light. No scleral  icterus.  Neck: Normal range of motion. Neck supple. No JVD present.  Cardiovascular: Normal rate, regular rhythm, normal heart sounds and intact distal pulses.   No murmur heard.      Both lower extremities have 1+ edema. She has 3+ femoral popliteal and dorsalis pedis pulses palpable bilaterally. She has multiple reticular and spider veins are carefully below the knees and ankle areas. There is an area of resolving thrombophlebitis in the right posterior calf just below the popliteal fossa. No bulging varicosities are noted in the thighs.  Pulmonary/Chest: Effort normal and breath sounds normal. No stridor. No respiratory distress. She has no wheezes. She has no rales.  Abdominal: Soft. She exhibits no distension and no mass. There is no tenderness. There is no rebound and no guarding.  Musculoskeletal: Normal range of motion. She exhibits no edema and no tenderness.  Lymphadenopathy:    She has no cervical adenopathy.  Neurological: She is alert and oriented to person, place, and time. Coordination normal.  Skin: Skin is warm and dry. No rash noted. No erythema.  Psychiatric: She has a normal mood and affect. Her behavior is normal.       Assessment:     Today he ordered a venous duplex exam of both lower extremities which are reviewed and interpreted. There is no DVT in either leg. There is evidence of resolving thrombophlebitis in the right posterior calf. There is some mild reflux in the left great saphenous system although the vein is quite small. The right great saphenous vein is free reflux. There is a Baker's cyst in the left popliteal fossa. The patient does have some mild diffuse venous disease and has a history of bilateral thrombophlebitis. I do not think she needs any active treatment for her venous disease at this time.    Plan:     Short-leg lower extremity stockings (20-30 mm). Elevate legs at night No further recommendations at this time

## 2011-01-10 NOTE — Procedures (Unsigned)
LOWER EXTREMITY VENOUS REFLUX EXAM  INDICATION:  Painful varicose veins with a history of thrombus.  EXAM:  Using color-flow imaging and pulse Doppler spectral analysis, the bilateral common femoral, femoral, popliteal, posterior tibial, great and small saphenous veins were evaluated.  There is no evidence suggesting deep venous insufficiency in the bilateral lower extremities.  The bilateral saphenofemoral junction is competent.  The left GSV is not competent with reflux at >500 milliseconds with the caliber as described below.  The right GSV appears competent, however, it feeds multiple varicosities.  The left proximal small saphenous vein cannot be visualized due to a large Baker's cyst.  The right small saphenous vein appears competent, however, there are noncompressible varicosities in the area.  GSV Diameter (used if found to be incompetent only)                                           Right    Left Proximal Greater Saphenous Vein           0.36 cm  0.46 cm Proximal-to-mid-thigh                     0.42 cm  0.52 cm Mid thigh                                 0.34 cm  0.20 cm Mid-distal thigh                          cm       cm Distal thigh                              0.32 cm  0.19 cm Knee                                      0.36 cm  0.19 cm  IMPRESSION: 1. Left great saphenous vein is not competent with reflux of >500     milliseconds. 2. The left great saphenous vein is not tortuous. 3. The deep venous system is competent. 4. The left small saphenous vein could not be visualized due to     Baker's cyst. 5. The right great saphenous vein appears competent with multiple     varicose branches. 6. The right small saphenous vein is competent with noncompressible     varicosities. 7. Superficial venous thrombus in the posterior calf varicosities of     the small saphenous vein.       ___________________________________________ Nelda Severe. Kellie Simmering,  M.D.  LT/MEDQ  D:  01/03/2011  T:  01/03/2011  Job:  [id number]

## 2011-02-15 ENCOUNTER — Encounter: Payer: Self-pay | Admitting: *Deleted

## 2011-02-15 ENCOUNTER — Ambulatory Visit (HOSPITAL_COMMUNITY)
Admission: RE | Admit: 2011-02-15 | Discharge: 2011-02-15 | Disposition: A | Discharge status: Home / Self Care | Payer: Medicare Other | Source: Ambulatory Visit | Attending: Pulmonary Disease | Admitting: Pulmonary Disease

## 2011-02-15 ENCOUNTER — Other Ambulatory Visit (HOSPITAL_COMMUNITY): Payer: Self-pay | Admitting: Pulmonary Disease

## 2011-02-15 DIAGNOSIS — R42 Dizziness and giddiness: Secondary | ICD-10-CM

## 2011-02-15 DIAGNOSIS — R9431 Abnormal electrocardiogram [ECG] [EKG]: Secondary | ICD-10-CM

## 2011-02-15 DIAGNOSIS — I6529 Occlusion and stenosis of unspecified carotid artery: Secondary | ICD-10-CM | POA: Insufficient documentation

## 2011-02-16 ENCOUNTER — Ambulatory Visit (HOSPITAL_COMMUNITY): Payer: Medicare Other | Attending: Pulmonary Disease

## 2011-02-16 DIAGNOSIS — R9431 Abnormal electrocardiogram [ECG] [EKG]: Secondary | ICD-10-CM | POA: Insufficient documentation

## 2011-02-16 DIAGNOSIS — I10 Essential (primary) hypertension: Secondary | ICD-10-CM | POA: Insufficient documentation

## 2011-02-17 ENCOUNTER — Encounter (HOSPITAL_COMMUNITY): Payer: Self-pay | Admitting: Pulmonary Disease

## 2011-05-02 ENCOUNTER — Other Ambulatory Visit: Payer: Self-pay | Admitting: Pulmonary Disease

## 2011-05-02 DIAGNOSIS — Z1231 Encounter for screening mammogram for malignant neoplasm of breast: Secondary | ICD-10-CM

## 2011-06-02 ENCOUNTER — Ambulatory Visit: Payer: Medicare Other

## 2011-07-04 ENCOUNTER — Ambulatory Visit
Admission: RE | Admit: 2011-07-04 | Discharge: 2011-07-04 | Disposition: A | Discharge status: Home / Self Care | Payer: Medicare Other | Source: Ambulatory Visit | Attending: Pulmonary Disease | Admitting: Pulmonary Disease

## 2011-07-04 DIAGNOSIS — Z1231 Encounter for screening mammogram for malignant neoplasm of breast: Secondary | ICD-10-CM

## 2012-07-05 ENCOUNTER — Other Ambulatory Visit: Payer: Self-pay | Admitting: Pulmonary Disease

## 2012-07-05 DIAGNOSIS — Z1231 Encounter for screening mammogram for malignant neoplasm of breast: Secondary | ICD-10-CM

## 2012-08-02 ENCOUNTER — Ambulatory Visit: Payer: Medicare Other

## 2012-08-05 ENCOUNTER — Ambulatory Visit
Admission: RE | Admit: 2012-08-05 | Discharge: 2012-08-05 | Disposition: A | Discharge status: Home / Self Care | Payer: Medicare Other | Source: Ambulatory Visit | Attending: Pulmonary Disease | Admitting: Pulmonary Disease

## 2012-08-05 DIAGNOSIS — Z1231 Encounter for screening mammogram for malignant neoplasm of breast: Secondary | ICD-10-CM

## 2012-09-08 ENCOUNTER — Observation Stay (HOSPITAL_COMMUNITY)
Admission: EM | Admit: 2012-09-08 | Discharge: 2012-09-09 | Disposition: A | Discharge status: Home / Self Care | Payer: Medicare Other | Attending: Internal Medicine | Admitting: Internal Medicine

## 2012-09-08 ENCOUNTER — Encounter (HOSPITAL_COMMUNITY): Payer: Self-pay | Admitting: *Deleted

## 2012-09-08 ENCOUNTER — Encounter (HOSPITAL_COMMUNITY): Payer: Self-pay

## 2012-09-08 ENCOUNTER — Emergency Department (HOSPITAL_COMMUNITY)
Admission: EM | Admit: 2012-09-08 | Discharge: 2012-09-08 | Disposition: A | Discharge status: Nursing Home (Includes ICF) | Payer: Medicare Other | Source: Home / Self Care | Attending: Family Medicine | Admitting: Family Medicine

## 2012-09-08 ENCOUNTER — Emergency Department (HOSPITAL_COMMUNITY): Payer: Medicare Other

## 2012-09-08 DIAGNOSIS — F329 Major depressive disorder, single episode, unspecified: Secondary | ICD-10-CM | POA: Diagnosis present

## 2012-09-08 DIAGNOSIS — R0789 Other chest pain: Secondary | ICD-10-CM

## 2012-09-08 DIAGNOSIS — J45909 Unspecified asthma, uncomplicated: Secondary | ICD-10-CM | POA: Diagnosis present

## 2012-09-08 DIAGNOSIS — K219 Gastro-esophageal reflux disease without esophagitis: Secondary | ICD-10-CM | POA: Diagnosis present

## 2012-09-08 DIAGNOSIS — Z86718 Personal history of other venous thrombosis and embolism: Secondary | ICD-10-CM | POA: Insufficient documentation

## 2012-09-08 DIAGNOSIS — I10 Essential (primary) hypertension: Secondary | ICD-10-CM

## 2012-09-08 DIAGNOSIS — Z7902 Long term (current) use of antithrombotics/antiplatelets: Secondary | ICD-10-CM | POA: Insufficient documentation

## 2012-09-08 DIAGNOSIS — E785 Hyperlipidemia, unspecified: Secondary | ICD-10-CM | POA: Diagnosis present

## 2012-09-08 DIAGNOSIS — F32A Depression, unspecified: Secondary | ICD-10-CM | POA: Diagnosis present

## 2012-09-08 DIAGNOSIS — F3289 Other specified depressive episodes: Secondary | ICD-10-CM | POA: Insufficient documentation

## 2012-09-08 DIAGNOSIS — R079 Chest pain, unspecified: Secondary | ICD-10-CM

## 2012-09-08 HISTORY — DX: Reserved for concepts with insufficient information to code with codable children: IMO0002

## 2012-09-08 HISTORY — DX: Unspecified osteoarthritis, unspecified site: M19.90

## 2012-09-08 HISTORY — DX: Systemic lupus erythematosus, unspecified: M32.9

## 2012-09-08 HISTORY — DX: Unspecified asthma, uncomplicated: J45.909

## 2012-09-08 HISTORY — DX: Essential (primary) hypertension: I10

## 2012-09-08 HISTORY — DX: Asymptomatic varicose veins of unspecified lower extremity: I83.90

## 2012-09-08 HISTORY — DX: Headache: R51

## 2012-09-08 LAB — CBC WITH DIFFERENTIAL/PLATELET
Eosinophils Relative: 4 % (ref 0–5)
HCT: 32.1 % — ABNORMAL LOW (ref 36.0–46.0)
Lymphocytes Relative: 34 % (ref 12–46)
Lymphs Abs: 1.4 10*3/uL (ref 0.7–4.0)
MCV: 81.5 fL (ref 78.0–100.0)
Monocytes Absolute: 0.3 10*3/uL (ref 0.1–1.0)
Neutro Abs: 2.3 10*3/uL (ref 1.7–7.7)
RBC: 3.94 MIL/uL (ref 3.87–5.11)
WBC: 4.2 10*3/uL (ref 4.0–10.5)

## 2012-09-08 LAB — POCT I-STAT TROPONIN I: Troponin i, poc: 0 ng/mL (ref 0.00–0.08)

## 2012-09-08 LAB — BASIC METABOLIC PANEL
CO2: 30 mEq/L (ref 19–32)
Calcium: 10 mg/dL (ref 8.4–10.5)
Chloride: 105 mEq/L (ref 96–112)
Creatinine, Ser: 1.33 mg/dL — ABNORMAL HIGH (ref 0.50–1.10)
Glucose, Bld: 105 mg/dL — ABNORMAL HIGH (ref 70–99)
Sodium: 142 mEq/L (ref 135–145)

## 2012-09-08 LAB — RETICULOCYTES: Retic Count, Absolute: 57.3 10*3/uL (ref 19.0–186.0)

## 2012-09-08 MED ORDER — SODIUM CHLORIDE 0.9 % IJ SOLN
3.0000 mL | Freq: Two times a day (BID) | INTRAMUSCULAR | Status: DC
  Administered 2012-09-09: 3 mL via INTRAVENOUS

## 2012-09-08 MED ORDER — MONTELUKAST SODIUM 10 MG PO TABS
10.0000 mg | ORAL_TABLET | Freq: Every morning | ORAL | Status: DC
  Administered 2012-09-09: 10 mg via ORAL
  Filled 2012-09-08: qty 1

## 2012-09-08 MED ORDER — SODIUM CHLORIDE 0.9 % IV SOLN: 250.0000 mL | INTRAVENOUS | Status: DC | PRN

## 2012-09-08 MED ORDER — LABETALOL HCL 5 MG/ML IV SOLN
20.0000 mg | Freq: Once | INTRAVENOUS | Status: AC
  Administered 2012-09-08: 20 mg via INTRAVENOUS
  Filled 2012-09-08: qty 4

## 2012-09-08 MED ORDER — ACETAMINOPHEN 325 MG PO TABS
650.0000 mg | ORAL_TABLET | Freq: Four times a day (QID) | ORAL | Status: DC | PRN
  Administered 2012-09-08 – 2012-09-09 (×2): 650 mg via ORAL
  Filled 2012-09-08: qty 2
  Filled 2012-09-08: qty 1

## 2012-09-08 MED ORDER — SODIUM CHLORIDE 0.9 % IJ SOLN
3.0000 mL | Freq: Two times a day (BID) | INTRAMUSCULAR | Status: DC
  Administered 2012-09-08: 3 mL via INTRAVENOUS

## 2012-09-08 MED ORDER — FAMOTIDINE 40 MG PO TABS
40.0000 mg | ORAL_TABLET | Freq: Two times a day (BID) | ORAL | Status: DC
  Administered 2012-09-09 (×2): 40 mg via ORAL
  Filled 2012-09-08 (×3): qty 1

## 2012-09-08 MED ORDER — ONDANSETRON HCL 4 MG/2ML IJ SOLN: 4.0000 mg | Freq: Four times a day (QID) | INTRAMUSCULAR | Status: DC | PRN

## 2012-09-08 MED ORDER — SODIUM CHLORIDE 0.9 % IV SOLN
Freq: Once | INTRAVENOUS | Status: AC
  Administered 2012-09-08: 15:00:00 via INTRAVENOUS

## 2012-09-08 MED ORDER — MORPHINE SULFATE 2 MG/ML IJ SOLN: 2.0000 mg | INTRAMUSCULAR | Status: DC | PRN

## 2012-09-08 MED ORDER — ALBUTEROL SULFATE HFA 108 (90 BASE) MCG/ACT IN AERS
2.0000 | INHALATION_SPRAY | Freq: Four times a day (QID) | RESPIRATORY_TRACT | Status: DC | PRN
  Filled 2012-09-08: qty 6.7

## 2012-09-08 MED ORDER — ONDANSETRON HCL 4 MG PO TABS: 4.0000 mg | ORAL_TABLET | Freq: Four times a day (QID) | ORAL | Status: DC | PRN

## 2012-09-08 MED ORDER — METOPROLOL TARTRATE 50 MG PO TABS
50.0000 mg | ORAL_TABLET | Freq: Every morning | ORAL | Status: DC
  Administered 2012-09-09: 50 mg via ORAL
  Filled 2012-09-08: qty 1

## 2012-09-08 MED ORDER — VITAMIN B-12 1000 MCG PO TABS
1000.0000 ug | ORAL_TABLET | Freq: Every day | ORAL | Status: DC
  Administered 2012-09-09: 1000 ug via ORAL
  Filled 2012-09-08: qty 1

## 2012-09-08 MED ORDER — DOCUSATE SODIUM 100 MG PO CAPS
100.0000 mg | ORAL_CAPSULE | Freq: Two times a day (BID) | ORAL | Status: DC
  Administered 2012-09-09 (×2): 100 mg via ORAL
  Filled 2012-09-08 (×3): qty 1

## 2012-09-08 MED ORDER — SODIUM CHLORIDE 0.9 % IJ SOLN: 3.0000 mL | INTRAMUSCULAR | Status: DC | PRN

## 2012-09-08 MED ORDER — ALBUTEROL SULFATE (5 MG/ML) 0.5% IN NEBU: 2.5000 mg | INHALATION_SOLUTION | RESPIRATORY_TRACT | Status: DC | PRN

## 2012-09-08 MED ORDER — LABETALOL HCL 5 MG/ML IV SOLN: 10.0000 mg | INTRAVENOUS | Status: DC | PRN

## 2012-09-08 MED ORDER — CLOPIDOGREL BISULFATE 75 MG PO TABS
75.0000 mg | ORAL_TABLET | Freq: Every day | ORAL | Status: DC
  Administered 2012-09-09: 75 mg via ORAL
  Filled 2012-09-08: qty 1

## 2012-09-08 MED ORDER — FENOFIBRATE 160 MG PO TABS
160.0000 mg | ORAL_TABLET | Freq: Every day | ORAL | Status: DC
Start: 2012-09-09 — End: 2012-09-09
  Administered 2012-09-09: 160 mg via ORAL
  Filled 2012-09-08: qty 1

## 2012-09-08 MED ORDER — HYDRALAZINE HCL 20 MG/ML IJ SOLN
10.0000 mg | Freq: Once | INTRAMUSCULAR | Status: AC
  Administered 2012-09-08: 10 mg via INTRAVENOUS
  Filled 2012-09-08: qty 1

## 2012-09-08 MED ORDER — VERAPAMIL HCL ER 240 MG PO TBCR
240.0000 mg | EXTENDED_RELEASE_TABLET | Freq: Every morning | ORAL | Status: DC
  Administered 2012-09-09: 240 mg via ORAL
  Filled 2012-09-08: qty 1

## 2012-09-08 MED ORDER — ENOXAPARIN SODIUM 40 MG/0.4ML ~~LOC~~ SOLN
40.0000 mg | SUBCUTANEOUS | Status: DC
  Administered 2012-09-09: 40 mg via SUBCUTANEOUS
  Filled 2012-09-08: qty 0.4

## 2012-09-08 NOTE — ED Notes (Signed)
Paged Dr. Truman Hayward to inform him of patient's elevated BP prior to patient being transferred upstairs. Orders received.

## 2012-09-08 NOTE — ED Notes (Signed)
Report called to [facility], ED Charge RN.

## 2012-09-08 NOTE — ED Notes (Signed)
[facility] notified of transfer.

## 2012-09-08 NOTE — ED Notes (Signed)
Pt to ED via [facility] from urgent care. Pt states that she started having chest pain 2 days ago, and this morning it became worse. Pt states that the pain is central chest and does not radiate.  Pt states that she felt better after a home neb treatment. Denies N/V. Alert and oriented. Skin warm and dry. Answers questions appropriately.

## 2012-09-08 NOTE — ED Notes (Signed)
Reports intermittent mid-upper chest tightness/cramping over past 2 days, usually associated with some SOB.  Felt slightly dizzy this morning.  This morning at church felt like her heart was racing, but did not have chest pain associated with it at that time.  Also feels her asthma has been flaring up over past couple weeks.  Sternal area tender to palpation.  Has taken Tyl for pain.

## 2012-09-08 NOTE — ED Provider Notes (Signed)
Medical screening examination/treatment/procedure(s) were conducted as a shared visit with non-physician practitioner(s) and myself.  I personally evaluated the patient during the encounter.  77yF with recurrent substernal chest pressure. Hx of GERD and asthma but not her typical symptoms of either. Hx of DVT, but my suspicion for PE is low. On plavix. Pt not greatest historian but seems to be because of this history. No diagnosed CAD, but hx hx of HTN and CAD. Pt not sure if ever had stress testing or catheterization. Her EKG and initial w/u with fairly unremarkable. HPI somewhat concerning and risk factors. Will admit for r/o.   EKG:  Rhythm: normal sinus Vent. rate 73 BPM PR interval 158 ms QRS duration 90 ms QT/QTc 398/438 ms ST segments: normal Comparison: none   Virgel Manifold, MD 09/08/12 1933

## 2012-09-08 NOTE — ED Provider Notes (Signed)
History     CSN: [id number]  Arrival date & time 09/08/12  1402   First MD Initiated Contact with Patient 09/08/12 1417      Chief Complaint  Patient presents with  . Chest Pain    (Consider location/radiation/quality/duration/timing/severity/associated sxs/prior treatment) Patient is a 79 y.o. female presenting with chest pain. The history is provided by the patient.  Chest Pain Pain location:  Substernal area Pain quality: tightness   Pain radiates to:  Does not radiate Pain radiates to the back: no   Pain severity:  Moderate Onset quality:  Sudden Duration: onset today around 1:30 while at church. Progression:  Waxing and waning Chronicity:  New Associated symptoms: heartburn, palpitations and shortness of breath   Associated symptoms: no lower extremity edema, no nausea and not vomiting   Risk factors: prior DVT/PE   Risk factors comment:  H/o asthma and gerd with medical management.   Past Medical History  Diagnosis Date  . Asthma   . History of DVT (deep vein thrombosis)   . Allergy   . GERD (gastroesophageal reflux disease)   . Hyperlipidemia   . Hypertension     Past Surgical History  Procedure Laterality Date  . Rotator cuff repair    . Abdominal hysterectomy    . Breast surgery    . Carpal tunnel release      No family history on file.  History  Substance Use Topics  . Smoking status: Never Smoker   . Smokeless tobacco: Not on file  . Alcohol Use: No    OB History   Grav Para Term Preterm Abortions TAB SAB Ect Mult Living                  Review of Systems  Constitutional: Negative.   HENT: Negative.   Respiratory: Positive for chest tightness and shortness of breath.   Cardiovascular: Positive for chest pain and palpitations. Negative for leg swelling.  Gastrointestinal: Positive for heartburn. Negative for nausea and vomiting.    Allergies  Aspirin and Penicillins  Home Medications   Current Outpatient Rx  Name  Route  Sig   Dispense  Refill  . albuterol (PROVENTIL) (2.5 MG/3ML) 0.083% nebulizer solution   Nebulization   Take 2.5 mg by nebulization every 6 (six) hours as needed. 1-2 puffs as needed          . clopidogrel (PLAVIX) 75 MG tablet   Oral   Take 75 mg by mouth daily.           . famotidine (PEPCID) 40 MG tablet   Oral   Take 40 mg by mouth daily.           . fenofibrate 160 MG tablet   Oral   Take 160 mg by mouth daily.           [name] ipratropium (ATROVENT) 0.02 % nebulizer solution   Nebulization   Take 500 mcg by nebulization every 8 (eight) hours as needed. For spasm          . losartan-hydrochlorothiazide (HYZAAR) 100-25 MG per tablet   Oral   Take 1 tablet by mouth daily.           . metoprolol (LOPRESSOR) 50 MG tablet   Oral   Take 50 mg by mouth 1 day or 1 dose.           . montelukast (SINGULAIR) 10 MG tablet   Oral   Take 10 mg by mouth 1 day or  1 dose.           . verapamil (COVERA HS) 240 MG (CO) 24 hr tablet   Oral   Take 240 mg by mouth at bedtime.           . hydrochlorothiazide 25 MG tablet   Oral   Take 25 mg by mouth daily.           [name] HYDROcodone-acetaminophen (NORCO) 10-325 MG per tablet   Oral   Take 1 tablet by mouth every 6 (six) hours as needed. One half to 2 tabs po q 6h prn pain            BP 185/61  Pulse 68  Temp(Src) 98.4 F (36.9 C) (Oral)  Resp 19  SpO2 98%  Physical Exam  Nursing note and vitals reviewed. Constitutional: She is oriented to person, place, and time. She appears well-developed and well-nourished.  HENT:  Head: Normocephalic.  Mouth/Throat: Oropharynx is clear and moist.  Neck: Normal range of motion. Neck supple.  Cardiovascular: Normal rate, regular rhythm, normal heart sounds and intact distal pulses.   Pulmonary/Chest: Effort normal and breath sounds normal. She exhibits tenderness.  Abdominal: Soft. Bowel sounds are normal.  Lymphadenopathy:    She has no cervical adenopathy.  Neurological: She  is alert and oriented to person, place, and time.  Skin: Skin is warm and dry.    ED Course  Procedures (including critical care time)  Labs Reviewed - No data to display No results found.   1. Atypical chest pain       MDM  Sent for eval of atypical chest pain, cramping /tightness off and on since fri. [id number].        Billy Fischer, MD 09/08/12 1444

## 2012-09-08 NOTE — ED Notes (Signed)
Called report to [facility], unit 3W.

## 2012-09-08 NOTE — ED Notes (Signed)
Report given to [facility]. 

## 2012-09-08 NOTE — H&P (Signed)
[facility] History and Physical  [name] [id number] DOB: Dec 24, 1933    PCP:   Leola Brazil, MD   Chief Complaint: chest pain and feeling fatigue.  HPI: [name] is an 79 y.o. female with hx of hyperlipidemia, hypertension, prior DVT on Plavix (she is allergic to ASA), asthma, GERD, presents to the ER feeling fatigue and started to the substernal chest pressure and palpitation for a few days.  She denied shortness of breath, nausea or vomiting, diaphoresis, fever, chills, exertional symptoms.  She appears depressed to me and as it turned out, she has been mourning the death of her daughter since 05/25/12 of an MI.  Evaluation in the ER included an EKG which showed NSR and no acute ST-T changes, negative initial troponin, and CXR with no acute processes.  Her WBC is normal, Hb is a little low at 10.2 G/DL, and cr is 1.33.  Hospitalist was asked to admit her for chest pain r/out.  Rewiew of Systems:  Constitutional: Negative for fever and chills. No significant weight loss or weight gain Eyes: Negative for eye pain, redness and discharge, diplopia, visual changes, or flashes of light. ENMT: Negative for ear pain, hoarseness, nasal congestion, sinus pressure and sore throat. No headaches; tinnitus, drooling, or problem swallowing. Cardiovascular: Negative for  diaphoresis, dyspnea and peripheral edema. ; No orthopnea, PND Respiratory: Negative for cough, hemoptysis, wheezing and stridor. No pleuritic chestpain. Gastrointestinal: Negative for nausea, vomiting, diarrhea, constipation, abdominal pain, melena, blood in stool, hematemesis, jaundice and rectal bleeding.    Genitourinary: Negative for frequency, dysuria, incontinence,flank pain and hematuria; Musculoskeletal: Negative for back pain and neck pain. Negative for swelling and trauma.;  Skin: . Negative for pruritus, rash, abrasions, bruising and skin lesion.; ulcerations Neuro: Negative for headache,  lightheadedness and neck stiffness. Negative for weakness, altered level of consciousness , altered mental status, extremity weakness, burning feet, involuntary movement, seizure and syncope.  Psych: negative for anxiety,  insomnia, tearfulness, panic attacks, hallucinations, paranoia, suicidal or homicidal ideation    Past Medical History  Diagnosis Date  . Asthma   . History of DVT (deep vein thrombosis)   . Allergy   . GERD (gastroesophageal reflux disease)   . Hyperlipidemia   . Hypertension     Past Surgical History  Procedure Laterality Date  . Rotator cuff repair    . Abdominal hysterectomy    . Breast surgery    . Carpal tunnel release      Medications:  HOME MEDS: Prior to Admission medications   Medication Sig Start Date End Date Taking? Authorizing Provider  albuterol (PROVENTIL HFA;VENTOLIN HFA) 108 (90 BASE) MCG/ACT inhaler Inhale 2 puffs into the lungs every 6 (six) hours as needed for wheezing. For wheezing   Yes Historical Provider, MD  albuterol (PROVENTIL) (2.5 MG/3ML) 0.083% nebulizer solution Take 2.5 mg by nebulization every 6 (six) hours as needed. For wheezing   Yes Historical Provider, MD  clopidogrel (PLAVIX) 75 MG tablet Take 75 mg by mouth daily.     Yes Historical Provider, MD  famotidine (PEPCID) 40 MG tablet Take 40 mg by mouth 2 (two) times daily.    Yes Historical Provider, MD  fenofibrate 160 MG tablet Take 160 mg by mouth daily.     Yes Historical Provider, MD  losartan-hydrochlorothiazide (HYZAAR) 100-25 MG per tablet Take 1 tablet by mouth at bedtime.    Yes Historical Provider, MD  metoprolol (LOPRESSOR) 50 MG tablet Take 50 mg by mouth every morning.  Yes Historical Provider, MD  montelukast (SINGULAIR) 10 MG tablet Take 10 mg by mouth every morning.    Yes Historical Provider, MD  verapamil (COVERA HS) 240 MG (CO) 24 hr tablet Take 240 mg by mouth every morning.    Yes Historical Provider, MD  vitamin B-12 (CYANOCOBALAMIN) 1000 MCG tablet  Take 1,000 mcg by mouth daily.   Yes Historical Provider, MD     Allergies:  Allergies  Allergen Reactions  . Aspirin Other (See Comments)    wheezing  . Penicillins Nausea And Vomiting and Other (See Comments)    Wheezing    Social History:   reports that she has never smoked. She does not have any smokeless tobacco history on file. She reports that she does not drink alcohol or use illicit drugs.  Family History: No family history on file.   Physical Exam: Filed Vitals:   09/08/12 1548  BP: 202/73  Pulse: 72  Temp: 97.9 F (36.6 C)  TempSrc: Oral  Resp: 14  SpO2: 100%   Blood pressure 202/73, pulse 72, temperature 97.9 F (36.6 C), temperature source Oral, resp. rate 14, SpO2 100.00%.  GEN:  Pleasant patient lying in the stretcher in no acute distress; cooperative with exam. PSYCH:  alert and oriented x4; does appear to be  depressed; affect is appropriate. HEENT: Mucous membranes pink and anicteric; PERRLA; EOM intact; no cervical lymphadenopathy nor thyromegaly or carotid bruit; no JVD; There were no stridor. Neck is very supple. Breasts:: Not examined CHEST WALL: slight tenderness over the costal sternal Jx. CHEST: Normal respiration, clear to auscultation bilaterally.  HEART: Regular rate and rhythm.  There are no murmur, rub, or gallops.   BACK: No kyphosis or scoliosis; no CVA tenderness ABDOMEN: soft and non-tender; no masses, no organomegaly, normal abdominal bowel sounds; no pannus; no intertriginous candida. There is no rebound and no distention. Rectal Exam: Not done EXTREMITIES: No bone or joint deformity; age-appropriate arthropathy of the hands and knees; no edema; no ulcerations.  There is no calf tenderness. Genitalia: not examined PULSES: 2+ and symmetric SKIN: Normal hydration no rash or ulceration CNS: Cranial nerves 2-12 grossly intact no focal lateralizing neurologic deficit.  Speech is fluent; uvula elevated with phonation, facial symmetry and  tongue midline. DTR are normal bilaterally, cerebella exam is intact, barbinski is negative and strengths are equaled bilaterally.  No sensory loss.   Labs on Admission:  Basic Metabolic Panel:  Recent Labs Lab 09/08/12 1640  NA 142  K 3.5  CL 105  CO2 30  GLUCOSE 105*  BUN 17  CREATININE 1.33*  CALCIUM 10.0   Liver Function Tests: No results found for this basename: AST, ALT, ALKPHOS, BILITOT, PROT, ALBUMIN,  in the last 168 hours No results found for this basename: LIPASE, AMYLASE,  in the last 168 hours No results found for this basename: AMMONIA,  in the last 168 hours CBC:  Recent Labs Lab 09/08/12 1640  WBC 4.2  NEUTROABS 2.3  HGB 10.9*  HCT 32.1*  MCV 81.5  PLT 193   Cardiac Enzymes: No results found for this basename: CKTOTAL, CKMB, CKMBINDEX, TROPONINI,  in the last 168 hours  CBG: No results found for this basename: GLUCAP,  in the last 168 hours   Radiological Exams on Admission: Dg Chest 2 View  09/08/2012  *RADIOLOGY REPORT*  Clinical Data: Chest pain.  Cough and congestion.  CHEST - 2 VIEW  Comparison: 12/22/2009  Findings: Chronic cardiomegaly noted without edema.  The lungs appear clear.  Lower thoracic spondylosis is observed.  No pleural effusion.  IMPRESSION:  1.  Cardiomegaly, without edema. 2.  Lower thoracic spondylosis.   Original Report Authenticated By: Van Clines, M.D.     EKG: Independently reviewed. NSR no acute ST-T changes.   Assessment/Plan Present on Admission:  . Chest pain, atypical . GERD (gastroesophageal reflux disease) . Depression . HTN (hypertension) . Hyperlipidemia . Asthma Anemia.  PLAN:  This nice patient presents to the ER with atypical chest pain.  She does have increased cardiac risk factors, and will be r/out.  Will obtain cardiac ECHO as well.  This could also be esophageal spasm or GERD, along with slight costochondritis as well.  She is depressed since the lost of her daughter, and I advised her to  follow up with her PCP, as antidepressant may be helpful.  She is allergic to ASA, so I will continue with her plavix.  I don't think this is ACS nor that she has any critical obtructive CAD.  She does have slight elevation of the Cr, so I will hold her ACE-I and diuretic temporarily.  For her anemia, will get anemia panel, and further work up can be done as outpatient.  She is stable, full code, and will be admitted to [facility] service.  Thank you for allowing me to partake in the care of this nice lady.  Other plans as per orders.  Code Status: FULL Haskel Khan, MD. [facility] Pager [phone] 7pm to 7am.  09/08/2012, 8:26 PM

## 2012-09-08 NOTE — ED Notes (Signed)
Pt placed on cardiac monitor (66 bpm) and O2 by [place] @ 2L/m (98% spO2)

## 2012-09-08 NOTE — ED Provider Notes (Signed)
History     CSN: [id number]  Arrival date & time 09/08/12  43   First MD Initiated Contact with Patient 09/08/12 1607      Chief Complaint  Patient presents with  . Chest Pain    (Consider location/radiation/quality/duration/timing/severity/associated sxs/prior treatment) HPI Comments: 79 year old female presents today with intermittent chest pain since Friday. When the chest pain comes it lasts about 10-15 minutes. It has come 3 times in the past 3 days. She was not seen until today because each time the chest pain has resolved. The pain came today at church and she decided that she should be seen for her. She was evaluated at an urgent care or she was sent here her more evaluation. It is not associated with diaphoresis or shortness of breath. She is having pain in her right arm. She describes the pain as a pressure. After the pain and pressure resolved she states she still feels a heaviness in her chest. No fevers, chills, abdominal pain.   The history is provided by the patient. No language interpreter was used.    Past Medical History  Diagnosis Date  . Asthma   . History of DVT (deep vein thrombosis)   . Allergy   . GERD (gastroesophageal reflux disease)   . Hyperlipidemia   . Hypertension     Past Surgical History  Procedure Laterality Date  . Rotator cuff repair    . Abdominal hysterectomy    . Breast surgery    . Carpal tunnel release      No family history on file.  History  Substance Use Topics  . Smoking status: Never Smoker   . Smokeless tobacco: Not on file  . Alcohol Use: No    OB History   Grav Para Term Preterm Abortions TAB SAB Ect Mult Living                  Review of Systems  Constitutional: Negative for fever and chills.  Respiratory: Positive for chest tightness. Negative for shortness of breath.   Cardiovascular: Positive for chest pain.  Gastrointestinal: Negative for abdominal pain.  All other systems reviewed and are  negative.    Allergies  Aspirin and Penicillins  Home Medications   Current Outpatient Rx  Name  Route  Sig  Dispense  Refill  . albuterol (PROVENTIL) (2.5 MG/3ML) 0.083% nebulizer solution   Nebulization   Take 2.5 mg by nebulization every 6 (six) hours as needed. 1-2 puffs as needed          . clopidogrel (PLAVIX) 75 MG tablet   Oral   Take 75 mg by mouth daily.           . famotidine (PEPCID) 40 MG tablet   Oral   Take 40 mg by mouth daily.           . fenofibrate 160 MG tablet   Oral   Take 160 mg by mouth daily.           . hydrochlorothiazide 25 MG tablet   Oral   Take 25 mg by mouth daily.           [name] HYDROcodone-acetaminophen (NORCO) 10-325 MG per tablet   Oral   Take 1 tablet by mouth every 6 (six) hours as needed. One half to 2 tabs po q 6h prn pain          . ipratropium (ATROVENT) 0.02 % nebulizer solution   Nebulization   Take 500 mcg  by nebulization every 8 (eight) hours as needed. For spasm          . losartan-hydrochlorothiazide (HYZAAR) 100-25 MG per tablet   Oral   Take 1 tablet by mouth daily.           . metoprolol (LOPRESSOR) 50 MG tablet   Oral   Take 50 mg by mouth 1 day or 1 dose.           . montelukast (SINGULAIR) 10 MG tablet   Oral   Take 10 mg by mouth 1 day or 1 dose.           . verapamil (COVERA HS) 240 MG (CO) 24 hr tablet   Oral   Take 240 mg by mouth at bedtime.             BP 202/73  Pulse 72  Temp(Src) 97.9 F (36.6 C) (Oral)  Resp 14  SpO2 100%  Physical Exam  Nursing note and vitals reviewed. Constitutional: She is oriented to person, place, and time. She appears well-developed and well-nourished. No distress.  HENT:  Head: Normocephalic and atraumatic.  Right Ear: External ear normal.  Left Ear: External ear normal.  Nose: Nose normal.  Mouth/Throat: Oropharynx is clear and moist.  Eyes: Conjunctivae are normal.  Neck: Normal range of motion.  Cardiovascular: Normal rate, regular  rhythm and normal heart sounds.   Pulmonary/Chest: Effort normal and breath sounds normal. No stridor. No respiratory distress. She has no wheezes. She has no rales. She exhibits no tenderness and no bony tenderness.  Abdominal: Soft. She exhibits no distension.  Musculoskeletal: Normal range of motion.  Neurological: She is alert and oriented to person, place, and time. She has normal strength.  Skin: Skin is warm and dry. She is not diaphoretic. No erythema.  Psychiatric: She has a normal mood and affect. Her behavior is normal.    ED Course  Procedures (including critical care time)  Labs Reviewed  CBC WITH DIFFERENTIAL - Abnormal; Notable for the following:    Hemoglobin 10.9 (*)    HCT 32.1 (*)    All other components within normal limits  BASIC METABOLIC PANEL - Abnormal; Notable for the following:    Glucose, Bld 105 (*)    Creatinine, Ser 1.33 (*)    GFR calc non Af Amer 38 (*)    GFR calc Af Amer 44 (*)    All other components within normal limits  RETICULOCYTES  VITAMIN B12  FOLATE  IRON AND TIBC  FERRITIN  OCCULT BLOOD X 1 CARD TO LAB, STOOL  POCT I-STAT TROPONIN I   Dg Chest 2 View  09/08/2012  *RADIOLOGY REPORT*  Clinical Data: Chest pain.  Cough and congestion.  CHEST - 2 VIEW  Comparison: 12/22/2009  Findings: Chronic cardiomegaly noted without edema.  The lungs appear clear.  Lower thoracic spondylosis is observed.  No pleural effusion.  IMPRESSION:  1.  Cardiomegaly, without edema. 2.  Lower thoracic spondylosis.   Original Report Authenticated By: Van Clines, M.D.      Date: 09/08/2012  Rate: 61  Rhythm: normal sinus rhythm  QRS Axis: normal  Intervals: normal  ST/T Wave abnormalities: nonspecific T wave changes  Conduction Disutrbances:none  Narrative Interpretation:   Old EKG Reviewed: none available    1. Chest pain   2. Asthma   3. Depression   4. GERD (gastroesophageal reflux disease)       MDM  Patient presents with symptoms of  typical chest pain  intermittently for 3 days. Labs generally negative for ACS. EKG shows nonspecific t wave changes. X-ray shows cardiomegaly without edema. History of DVT. Low suspicion for PE. On plavix. Case was discussed with Dr. Wilson Singer. Hospitalist team will to be consulted. Agree to admit for further workup. Discussed plan with patient and family.         Elwyn Lade, PA-C 09/08/12 2236

## 2012-09-09 ENCOUNTER — Encounter (HOSPITAL_COMMUNITY): Payer: Self-pay | Admitting: General Practice

## 2012-09-09 DIAGNOSIS — I10 Essential (primary) hypertension: Secondary | ICD-10-CM

## 2012-09-09 DIAGNOSIS — E785 Hyperlipidemia, unspecified: Secondary | ICD-10-CM

## 2012-09-09 LAB — FOLATE: Folate: 10.9 ng/mL

## 2012-09-09 LAB — LIPID PANEL
HDL: 47 mg/dL (ref >39)
Total CHOL/HDL Ratio: 3.6 RATIO
VLDL: 30 mg/dL (ref 0–40)

## 2012-09-09 LAB — IRON AND TIBC
Iron: 81 ug/dL (ref 42–135)
Saturation Ratios: 23 % (ref 20–55)
UIBC: 271 ug/dL (ref 125–400)

## 2012-09-09 LAB — TROPONIN I: Troponin I: <0.3 ng/mL (ref <0.30)

## 2012-09-09 LAB — TSH: TSH: 1.786 u[IU]/mL (ref 0.350–4.500)

## 2012-09-09 MED ORDER — PANTOPRAZOLE SODIUM 40 MG PO TBEC: 40.0000 mg | DELAYED_RELEASE_TABLET | Freq: Every day | ORAL | Status: DC

## 2012-09-09 MED ORDER — CITALOPRAM HYDROBROMIDE 20 MG PO TABS: 20.0000 mg | ORAL_TABLET | Freq: Every day | ORAL | Status: DC

## 2012-09-09 MED ORDER — FAMOTIDINE 40 MG PO TABS: 40.0000 mg | ORAL_TABLET | Freq: Every day | ORAL | Status: DC

## 2012-09-09 NOTE — Discharge Summary (Signed)
Physician Discharge Summary  [name] [id number] DOB: 1933-08-05 DOA: 09/08/2012  PCP: Leola Brazil, MD  Admit date: 09/08/2012 Discharge date: 09/09/2012  Time spent: >30 minutes  Recommendations for Outpatient Follow-up:  1. Basic metabolic panel to follow electrolytes and also kidney function. 2. Recheck blood pressure and adjust medication as needed  Discharge Diagnoses:  Principal Problem:   Chest pain, atypical Active Problems:   GERD (gastroesophageal reflux disease)   Depression   HTN (hypertension)   Hyperlipidemia   Asthma   Discharge Condition: Stable and improved. No CP; will follow with PCP in 2 weeks.  Diet recommendation: heart healthy diet  Filed Weights   09/08/12 2247  Weight: 83.779 kg (184 lb 11.2 oz)    History of present illness:  79 y.o. female with hx of hyperlipidemia, hypertension, prior DVT on Plavix (she is allergic to ASA), asthma, GERD, presents to the ER feeling fatigue and started to the substernal chest pressure and palpitation for a few days. She denied shortness of breath, nausea or vomiting, diaphoresis, fever, chills, exertional symptoms. She appears depressed to me and as it turned out, she has been mourning the death of her daughter since 25-May-2012 of an MI. Evaluation in the ER included an EKG which showed NSR and no acute ST-T changes, negative initial troponin, and CXR with no acute processes. Her WBC is normal, Hb is a little low at 10.2 G/DL, and cr is 1.33. Hospitalist was asked to admit her for chest pain r/out.   Hospital Course:  1-chest pain: Noncardiac in etiology. Most likely secondary to gastroesophageal reflux disease. -Cardiac enzymes negative x3, no acute abnormalities on EKG or telemetry; essentially normal 2-D echo. -Patient will be treated with with protonix and famotidine -will continue Plavix, beta blocker, ACE inhibitor and statins. -Followup with PCP in 2 weeks. -Advised to follow a  low-sodium diet.  2-depression: Patient is started on Celexa.  3-grade 1 diastolic dysfunction: Compensated. Will continue controlling blood pressure and instructed to follow a low-sodium diet.  4-hypertension: Continue current medication regimen. -advised to follow a low-sodium diet  -will continue receiving adjustment of her medication as needed by primary care doctor.  5-hyperlipidemia: Continue statins.  6-asthma: Stable. Currently no wheezing. Continue home medication regimen of montelukast and albuterol.  *Resume stream remains stable and the plan is to continue current medication regimen. Patient advised to arrange visit with PCP in 2 weeks for further medication adjustment and followup.  Procedures: 2-D echo: No wall motion abnormalities. vigorous systolic function, mild LVH and ejection fraction 65-70%.  Consultations:  none  Discharge Exam: Filed Vitals:   09/09/12 0035 09/09/12 0600 09/09/12 1018 09/09/12 1417  BP: 156/57 183/77 188/72 183/81  Pulse: 73 67 68 65  Temp:  98.3 F (36.8 C)  98.3 F (36.8 C)  TempSrc:  Oral  Oral  Resp:  18  18  Height:      Weight:      SpO2:  97%  96%    General: No acute distress. Denies chest pain, nausea, vomiting or any other acute complaints. Cardiovascular: S1 and S2, no rubs or gallops Respiratory: CTA bilaterally Abdomen: soft, NT, ND, positive BS Neuro: non focal  Discharge Instructions  Discharge Orders   Future Orders Complete By Expires     Diet - low sodium heart healthy  As directed     Discharge instructions  As directed     Comments:      Take medications as prescribed. Arrange followup with  PCP in 2 weeks. Follow a heart healthy diet. Keep yourself well hydrated.        Medication List    TAKE these medications       albuterol (2.5 MG/3ML) 0.083% nebulizer solution  Commonly known as:  PROVENTIL  Take 2.5 mg by nebulization every 6 (six) hours as needed. For wheezing     albuterol 108 (90  BASE) MCG/ACT inhaler  Commonly known as:  PROVENTIL HFA;VENTOLIN HFA  Inhale 2 puffs into the lungs every 6 (six) hours as needed for wheezing. For wheezing     citalopram 20 MG tablet  Commonly known as:  CELEXA  Take 1 tablet (20 mg total) by mouth daily.     clopidogrel 75 MG tablet  Commonly known as:  PLAVIX  Take 75 mg by mouth daily.     famotidine 40 MG tablet  Commonly known as:  PEPCID  Take 1 tablet (40 mg total) by mouth at bedtime.     fenofibrate 160 MG tablet  Take 160 mg by mouth daily.     losartan-hydrochlorothiazide 100-25 MG per tablet  Commonly known as:  HYZAAR  Take 1 tablet by mouth at bedtime.     metoprolol 50 MG tablet  Commonly known as:  LOPRESSOR  Take 50 mg by mouth every morning.     montelukast 10 MG tablet  Commonly known as:  SINGULAIR  Take 10 mg by mouth every morning.     pantoprazole 40 MG tablet  Commonly known as:  PROTONIX  Take 1 tablet (40 mg total) by mouth daily.     verapamil 240 MG (CO) 24 hr tablet  Commonly known as:  COVERA HS  Take 240 mg by mouth every morning.     vitamin B-12 1000 MCG tablet  Commonly known as:  CYANOCOBALAMIN  Take 1,000 mcg by mouth daily.           Follow-up Information   Follow up with [facility] Ms JR,GEORGE R, MD. Schedule an appointment as soon as possible for a visit in 2 weeks.   Contact information:   [place] [address] [phone]        The results of significant diagnostics from this hospitalization (including imaging, microbiology, ancillary and laboratory) are listed below for reference.    Significant Diagnostic Studies: Dg Chest 2 View  09/08/2012  *RADIOLOGY REPORT*  Clinical Data: Chest pain.  Cough and congestion.  CHEST - 2 VIEW  Comparison: 12/22/2009  Findings: Chronic cardiomegaly noted without edema.  The lungs appear clear.  Lower thoracic spondylosis is observed.  No pleural effusion.  IMPRESSION:  1.  Cardiomegaly, without edema. 2.   Lower thoracic spondylosis.   Original Report Authenticated By: Van Clines, M.D.     Labs: Basic Metabolic Panel:  Recent Labs Lab 09/08/12 1640  NA 142  K 3.5  CL 105  CO2 30  GLUCOSE 105*  BUN 17  CREATININE 1.33*  CALCIUM 10.0   CBC:  Recent Labs Lab 09/08/12 1640  WBC 4.2  NEUTROABS 2.3  HGB 10.9*  HCT 32.1*  MCV 81.5  PLT 193   Cardiac Enzymes:  Recent Labs Lab 09/09/12 0001 09/09/12 0600 09/09/12 1057  TROPONINI <0.30 <0.30 <0.30    Signed:  Indianna Boran  [facility] 09/09/2012, 2:34 PM

## 2012-09-09 NOTE — Progress Notes (Signed)
*  PRELIMINARY RESULTS* Echocardiogram 2D Echocardiogram has been performed.  [name] 09/09/2012, 9:53 AM

## 2012-09-09 NOTE — Progress Notes (Signed)
Utilization review completed.  

## 2012-09-12 NOTE — ED Provider Notes (Signed)
Medical screening examination/treatment/procedure(s) were conducted as a shared visit with non-physician practitioner(s) and myself.  I personally evaluated the patient during the encounter. Please see other note.  Virgel Manifold, MD 09/12/12 [phone]

## 2012-10-21 ENCOUNTER — Ambulatory Visit (INDEPENDENT_AMBULATORY_CARE_PROVIDER_SITE_OTHER): Payer: Medicare Other | Admitting: Internal Medicine

## 2012-10-21 ENCOUNTER — Encounter: Payer: Self-pay | Admitting: Internal Medicine

## 2012-10-21 VITALS — BP 146/66 | HR 62 | Temp 98.5°F | Ht 61.0 in | Wt 188.0 lb

## 2012-10-21 DIAGNOSIS — J45909 Unspecified asthma, uncomplicated: Secondary | ICD-10-CM

## 2012-10-21 DIAGNOSIS — D649 Anemia, unspecified: Secondary | ICD-10-CM

## 2012-10-21 DIAGNOSIS — Z801 Family history of malignant neoplasm of trachea, bronchus and lung: Secondary | ICD-10-CM | POA: Insufficient documentation

## 2012-10-21 DIAGNOSIS — Z800 Family history of malignant neoplasm of digestive organs: Secondary | ICD-10-CM | POA: Insufficient documentation

## 2012-10-21 DIAGNOSIS — D638 Anemia in other chronic diseases classified elsewhere: Secondary | ICD-10-CM | POA: Insufficient documentation

## 2012-10-21 DIAGNOSIS — N289 Disorder of kidney and ureter, unspecified: Secondary | ICD-10-CM | POA: Insufficient documentation

## 2012-10-21 DIAGNOSIS — Z803 Family history of malignant neoplasm of breast: Secondary | ICD-10-CM | POA: Insufficient documentation

## 2012-10-21 DIAGNOSIS — E785 Hyperlipidemia, unspecified: Secondary | ICD-10-CM

## 2012-10-21 DIAGNOSIS — I10 Essential (primary) hypertension: Secondary | ICD-10-CM

## 2012-10-21 MED ORDER — OLMESARTAN-AMLODIPINE-HCTZ 40-5-25 MG PO TABS: 1.0000 | ORAL_TABLET | Freq: Every day | ORAL | Status: DC

## 2012-10-21 NOTE — Patient Instructions (Addendum)
fot now stop the losartan hctz and the verapamil.  Take the  tribenzor  Once a day and continue the metoprolol.   Recheck ov in 1 months   It may take 4-6 weeks to get good effect from the medications.  Contact us if concerns.

## 2012-10-21 NOTE — Progress Notes (Addendum)
Chief Complaint  Patient presents with  . Establish Care    Says she has had trouble with her bp getting high.    HPI: Patient comes in today with her granddaughter  Redith Macia for problems with high blood pressure is a new patient transfer care. Previous primary care doctor Dr. Katherine Roan.  She has had hypertension for a number of years only records to review those in the electronic health record. She's currently on verapamil metoprolol losartan HCTZ and her blood pressures seemed to be mostly in the 160 occasionally 180 range as taken by  her employer medical family taking blood pressure with a sphygmomanometer.   Their concerns about her blood pressure control.  She was held overnight for one day admitted for atypical chest pain in April with a negative screening workup and an echocardiogram that just showed mild LVH early diastolic dysfunction with a normal ejection fraction. She has no history of documented heart attack or stroke.  She is unable to give a history of other medications tried that cause her problem or were ineffective. Although she states she was on a diuretic when she was much younger and that was the first medication. Was questioned why she was on metoprolol and verapamil when she was in the hospital. She has no documented history of arrhythmia but does state that she's had palpitations in the past.  He has a very strong family history of hypertension and cancer. Mom and father  And 9  Brothers had   HT  History of right lower extremity DVT a few years ago treated eventually with Coumadin for 4-6 months. Uncertain if this was a triggered DVT postoperatively or not.  She has had no recurrences. She has a problem with her back and back stiffness but gets around in his not attending any inflammatory at this time. Was told to only take Tylenol she takes tramadol a couple times a week at the most for her back and joints.   Asthma : Onset when she was a child or teenager  on medications  Uses  Inhaler every day has nebulizer also.    Last hosp in 19 80s .    On singulair advair once a day.   Used to be on cortisone inhaler. Is taking Advair about once a day Not seasonal at this time  .   Not as bad now. Doesn't remember the last pulmonary function tests. 3 brothers deceased   2 ht   No renal failure . Sister colon cancer     Sister with breast.   Stomach cancer .  Daughter  Esophageal cancer   Died from MI  06/06/2023  Age  65 MI.  Other daughter died from Asthma  Attack. .  7 children  5 living  Parent died  Father lung  And  Mom lung cancer spread.   ROS: See pertinent positives and negatives per HPI.Some snoring      Not all the time   . No current chest pain shortness of breath related to asthma no unusual bleeding vision seems okay has eyeglasses left ear bothers her at times but hearing has been normal as well as exam. History of esophagitis symptoms when she takes Advil Aleve-type products. Is allergic to aspirin. By report Had hysterectomy    Non cancer   Partial  Dr Marlou Sa  Checking left  Finger.    He is currently in a splint and may need surgery.  Past Medical History  Diagnosis Date  . Asthma     "  onset when young"   . History of DVT (deep vein thrombosis)     "RLE" (09/09/2012)  . Allergy   . GERD (gastroesophageal reflux disease)   . Hyperlipidemia   . Hypertension   . Varicose veins   . Shortness of breath     "when I get a cold" (09/09/2012)  . Lupus     "cured years ago" (09/09/2012)  . Headache(784.0)     "related to my high blood pressure" (09/09/2012)  . Arthritis     "shoulders" (09/09/2012)    Family History  Problem Relation Age of Onset  . Hypertension Mother   . Hypertension Father   . Cancer Sister     Colon and Breast Cancer  . Hypertension Brother   . Heart attack Daughter   . Lung cancer      both parents     History   Social History  . Marital Status: Single    Spouse Name: N/A    Number of Children: N/A  . Years of  Education: N/A   Social History Main Topics  . Smoking status: Never Smoker   . Smokeless tobacco: Never Used  . Alcohol Use: No  . Drug Use: No  . Sexually Active: No   Other Topics Concern  . None   Social History Narrative   2 people living in the home.  Grand daughter.   Up and down through the night      Had 7 children  2 deceased .   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    works 3 days per week currently .   Glasses dentures  Neg tad     Outpatient Encounter Prescriptions as of 10/21/2012  Medication Sig Dispense Refill  . ADVAIR DISKUS 250-50 MCG/DOSE AEPB       . albuterol (PROVENTIL HFA;VENTOLIN HFA) 108 (90 BASE) MCG/ACT inhaler Inhale 2 puffs into the lungs every 6 (six) hours as needed for wheezing. For wheezing      . albuterol (PROVENTIL) (2.5 MG/3ML) 0.083% nebulizer solution Take 2.5 mg by nebulization every 6 (six) hours as needed. For wheezing      . ALPRAZolam (XANAX) 0.25 MG tablet       . clopidogrel (PLAVIX) 75 MG tablet Take 75 mg by mouth daily.       . famotidine (PEPCID) 40 MG tablet Take 40 mg by mouth. In the evening      . fenofibrate 160 MG tablet Take 160 mg by mouth daily.       . metoprolol (LOPRESSOR) 50 MG tablet Take 50 mg by mouth every morning.       . montelukast (SINGULAIR) 10 MG tablet Take 10 mg by mouth every morning.       . pantoprazole (PROTONIX) 40 MG tablet Take 40 mg by mouth daily. In the morning      . traMADol (ULTRAM) 50 MG tablet       . vitamin B-12 (CYANOCOBALAMIN) 1000 MCG tablet Take 1,000 mcg by mouth daily.      . [DISCONTINUED] famotidine (PEPCID) 40 MG tablet Take 1 tablet (40 mg total) by mouth at bedtime.      . [DISCONTINUED] losartan-hydrochlorothiazide (HYZAAR) 100-25 MG per tablet Take 1 tablet by mouth at bedtime.       . [DISCONTINUED] pantoprazole (PROTONIX) 40 MG tablet Take 1 tablet (40 mg total) by mouth daily.  30 tablet  1  . [DISCONTINUED] verapamil (COVERA  HS) 240  MG (CO) 24 hr tablet Take 240 mg by mouth every morning.       . Olmesartan-Amlodipine-HCTZ (TRIBENZOR) 40-5-25 MG TABS Take 1 capsule by mouth daily.  42 tablet  0  . [DISCONTINUED] citalopram (CELEXA) 20 MG tablet Take 1 tablet (20 mg total) by mouth daily.  30 tablet  1   No facility-administered encounter medications on file as of 10/21/2012.  3 days per week.  With 31 years. Works  tues  wed thurs.   EXAM:  BP 146/66  Pulse 62  Temp(Src) 98.5 F (36.9 C) (Oral)  Ht 5\' 1"  (1.549 m)  Wt 188 lb (85.276 kg)  BMI 35.54 kg/m2  SpO2 96%  Body mass index is 35.54 kg/(m^2).  GENERAL: vitals reviewed and listed above, alert, oriented, appears well hydrated and in no acute distress she is wearing glasses and here with her granddaughter.  HEENT: atraumatic, conjunctiva  clear, no obvious abnormalities on inspection of external nose and ears OP : no lesion edema or exudate TMs intact no acute findings  NECK: no obvious masses on inspection palpation no bruits are heard  LUNGS: clear to auscultation bilaterally, no wheezes, rales or rhonchi, good air movement  CV: HRRR, no clubbing cyanosis ornl cap refill  has +1 edema legs right more than left but no acute findings feet have normal pulses and good perfusion  MS: moves all extremities without noticeable focal  abnormality is some stiffness and pain when getting up on the table lying down but otherwise nonfocal exam grossly left ring finger is in a splint  PSYCH: pleasant and cooperative, no obvious depression or anxiety cognition not tested.   `` Lab Results  Component Value Date   WBC 4.2 09/08/2012   HGB 10.9* 09/08/2012   HCT 32.1* 09/08/2012   PLT 193 09/08/2012   GLUCOSE 105* 09/08/2012   CHOL 171 09/09/2012   TRIG 148 09/09/2012   HDL 47 09/09/2012   LDLCALC 94 09/09/2012   NA 142 09/08/2012   K 3.5 09/08/2012   CL 105 09/08/2012   CREATININE 1.33* 09/08/2012   BUN 17 09/08/2012   CO2 30 09/08/2012   TSH 1.786 09/09/2012   INR 0.94  12/22/2009    ASSESSMENT AND PLAN:  Discussed the following assessment and plan:  HTN (hypertension)  Asthma, unspecified asthma severity, uncomplicated  Family hx of colon cancer - sister  Family hx of lung cancer - parents  Family hx-breast malignancy - daughter  Hyperlipidemia - On fenofibrate  Renal insufficiency creatinine 1.3  Anemia - Noted at hospitalization in April. No active bleeding a history  Is on 4 medications currently with apparent inadequate control of her hypertension systolic. But otherwise no obvious secondary cause by hx and context .   Fortunately echocardiogram only shows mild LVH and no other obvious complication.  Uncertain why she is on beta blocker and verapamil pulse is in the low normal range. We have a number of options to try but  concern about too many rapid changes. Will stop the verapamil and losartan hctz and try samples of  tribenzor to see if works Caution about potential side effects   we switched medications orthostasis dizziness et Ronney Asters.  Although this medicine is  a brand medicine would like to see if it works for blood pressure control.  Her asthma apparently is reasonably well controlled although she does use albuterol every day. Apparently not escalating uncertain pulmonary function. She is on Singulair and Advair control.  Uncertain if  the metoprolol is adding to her problem. Uncertain why she was put on metoprolol. Although she has no specific history of MI. But may have had a history of palpitations at some time. -Patient advised to return or notify health care team  ifnew concerns arise. After patient left reviewed labs that were available apparently she was anemic at her last hospitalization uncertain if this is ongoing or new. It could be from mild renal insufficiency consider other causes. We'll plan followup laboratory studies at her followup visit. HCM  ? Is due of td ? If ever had colonoscopy.   Patient Instructions  fot now  stop the losartan hctz and the verapamil.  Take the  tribenzor  Once a day and continue the metoprolol.   Recheck ov in 1 months   It may take 4-6 weeks to get good effect from the medications.  Contact us if concerns.    Standley Brooking. Raha Tennison M.D.  Patient states that her 2 daughters should have permission to discuss her medical conditions. BP taken by provider  Right 158/60 and left 160/60 sitting.

## 2012-11-25 ENCOUNTER — Ambulatory Visit (INDEPENDENT_AMBULATORY_CARE_PROVIDER_SITE_OTHER): Payer: Medicare Other | Admitting: Internal Medicine

## 2012-11-25 ENCOUNTER — Encounter: Payer: Self-pay | Admitting: Internal Medicine

## 2012-11-25 VITALS — BP 138/70 | HR 64 | Temp 98.5°F | Wt 183.0 lb

## 2012-11-25 DIAGNOSIS — I10 Essential (primary) hypertension: Secondary | ICD-10-CM

## 2012-11-25 DIAGNOSIS — N289 Disorder of kidney and ureter, unspecified: Secondary | ICD-10-CM

## 2012-11-25 DIAGNOSIS — D649 Anemia, unspecified: Secondary | ICD-10-CM

## 2012-11-25 DIAGNOSIS — M549 Dorsalgia, unspecified: Secondary | ICD-10-CM

## 2012-11-25 LAB — CBC WITH DIFFERENTIAL/PLATELET
Basophils Absolute: 0 10*3/uL (ref 0.0–0.1)
Eosinophils Absolute: 0.1 10*3/uL (ref 0.0–0.7)
Lymphocytes Relative: 43.8 % (ref 12.0–46.0)
MCHC: 32.8 g/dL (ref 30.0–36.0)
Monocytes Relative: 7.1 % (ref 3.0–12.0)
Neutrophils Relative %: 45.8 % (ref 43.0–77.0)
RDW: 13.9 % (ref 11.5–14.6)

## 2012-11-25 LAB — BASIC METABOLIC PANEL
CO2: 29 mEq/L (ref 19–32)
Calcium: 9.9 mg/dL (ref 8.4–10.5)
Creatinine, Ser: 1.4 mg/dL — ABNORMAL HIGH (ref 0.4–1.2)
GFR: 46.15 mL/min — ABNORMAL LOW (ref >60.00)
Glucose, Bld: 109 mg/dL — ABNORMAL HIGH (ref 70–99)
Sodium: 140 mEq/L (ref 135–145)

## 2012-11-25 MED ORDER — OLMESARTAN-AMLODIPINE-HCTZ 40-5-25 MG PO TABS: 1.0000 | ORAL_TABLET | Freq: Every day | ORAL | Status: DC

## 2012-11-25 NOTE — Patient Instructions (Signed)
continue same meds bp better and may still decrease with time.  If medication is too expensive ask for samples or we can  Change to individula components of medications  Will notify you  of labs when available.  [name] in  About 6- 8 weeks or as needed   bp today 138/70  142/70  146 /80

## 2012-11-25 NOTE — Progress Notes (Signed)
Chief Complaint  Patient presents with  . Follow-up    HPI:  Pt comes for fu of uncontrolled? Ht   Taking samples of tribenzor  or and continuing her other medicines no significant side effects. No unusual swelling chest pain shortness of breath or syncope she is having problems with her lower back right has had this for a while not really taking medicine for it Difficulty with her left ring finger. For quite a while  Dr Marlou Sa to sees her on  28th  .   Poss procedulre needed.  Left ring finger.   Bp still up some at home  But dont have readings    Last note in June reviewed .   Patient states that her 2 daughters should have permission to discuss her medical conditions.   BP taken by provider Right 158/60 and left 160/60 sitting. At last visit   ROS: See pertinent positives and negatives per HPI.no rashes no bleeding   Past Medical History  Diagnosis Date  . Asthma     " onset when young"   . History of DVT (deep vein thrombosis)     "RLE" (09/09/2012)  . Allergy   . GERD (gastroesophageal reflux disease)   . Hyperlipidemia   . Hypertension   . Varicose veins   . Shortness of breath     "when I get a cold" (09/09/2012)  . Lupus     "cured years ago" (09/09/2012)  . Headache(784.0)     "related to my high blood pressure" (09/09/2012)  . Arthritis     "shoulders" (09/09/2012)    Family History  Problem Relation Age of Onset  . Hypertension Mother   . Hypertension Father   . Cancer Sister     Colon and Breast Cancer  . Hypertension Brother   . Heart attack Daughter   . Lung cancer      both parents     History   Social History  . Marital Status: Single    Spouse Name: N/A    Number of Children: N/A  . Years of Education: N/A   Social History Main Topics  . Smoking status: Never Smoker   . Smokeless tobacco: Never Used  . Alcohol Use: No  . Drug Use: No  . Sexually Active: No   Other Topics Concern  . None   Social History Narrative   2 people living  in the home.  Grand daughter.   Up and down through the night      Had 7 children  2 deceased .   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    works 3 days per week currently .   Glasses dentures  Neg tad     Outpatient Encounter Prescriptions as of 11/25/2012  Medication Sig Dispense Refill  . ADVAIR DISKUS 250-50 MCG/DOSE AEPB       . albuterol (PROVENTIL HFA;VENTOLIN HFA) 108 (90 BASE) MCG/ACT inhaler Inhale 2 puffs into the lungs every 6 (six) hours as needed for wheezing. For wheezing      . albuterol (PROVENTIL) (2.5 MG/3ML) 0.083% nebulizer solution Take 2.5 mg by nebulization every 6 (six) hours as needed. For wheezing      . ALPRAZolam (XANAX) 0.25 MG tablet       . clopidogrel (PLAVIX) 75 MG tablet Take 75 mg by mouth daily.       . famotidine (PEPCID) 40 MG tablet Take 40 mg by mouth. In  the evening      . fenofibrate 160 MG tablet Take 160 mg by mouth daily.       . metoprolol (LOPRESSOR) 50 MG tablet Take 50 mg by mouth every morning.       . montelukast (SINGULAIR) 10 MG tablet Take 10 mg by mouth every morning.       . Olmesartan-Amlodipine-HCTZ (TRIBENZOR) 40-5-25 MG TABS Take 1 capsule by mouth daily.  30 tablet  3  . traMADol (ULTRAM) 50 MG tablet       . vitamin B-12 (CYANOCOBALAMIN) 1000 MCG tablet Take 1,000 mcg by mouth daily.      . [DISCONTINUED] Olmesartan-Amlodipine-HCTZ (TRIBENZOR) 40-5-25 MG TABS Take 1 capsule by mouth daily.  42 tablet  0  . [DISCONTINUED] pantoprazole (PROTONIX) 40 MG tablet Take 40 mg by mouth daily. In the morning       No facility-administered encounter medications on file as of 11/25/2012.    EXAM:  BP 138/70  Pulse 64  Temp(Src) 98.5 F (36.9 C) (Oral)  Wt 183 lb (83.008 kg)  BMI 34.6 kg/m2  SpO2 96%  Body mass index is 34.6 kg/(m^2).  GENERAL: vitals reviewed and listed above, alert, oriented, appears well hydrated and in no acute distress HEENT: atraumatic, conjunctiva  clear, no obvious  abnormalities on inspection of external nose and ears OP : no lesion edema or exudate  NECK: no obvious masses on inspection palpation  LUNGS: clear to auscultation bilaterally, no wheezes, rales or rhonchi, good air movement CV: HRRR, no clubbing cyanosis or  peripheral edema nl cap refill  MS: moves all extremities without noticeable focal  Abnormality left ring finger in a splint. Gait is mildly antalgic points to her right SI lower back area PSYCH: pleasant and cooperative, no obvious depression or anxiety Lab Results  Component Value Date   WBC 4.7 11/25/2012   HGB 11.2* 11/25/2012   HCT 34.1* 11/25/2012   PLT 215.0 11/25/2012   GLUCOSE 109* 11/25/2012   CHOL 171 09/09/2012   TRIG 148 09/09/2012   HDL 47 09/09/2012   LDLCALC 94 09/09/2012   NA 140 11/25/2012   K 3.4* 11/25/2012   CL 103 11/25/2012   CREATININE 1.4* 11/25/2012   BUN 22 11/25/2012   CO2 29 11/25/2012   TSH 1.786 09/09/2012   INR 0.94 12/22/2009    ASSESSMENT AND PLAN:  Discussed the following assessment and plan:  HTN (hypertension) - Improved albeit still up 1:30 [id number] to [id number] systolic in the office. Continue samples and/or prescription consider changing HCTZ to chlorthalidone - Plan: Basic metabolic panel, CBC with Differential  Anemia - Plan: Basic metabolic panel, CBC with Differential  Renal insufficiency creatinine 1.3 - Plan: Basic metabolic panel, CBC with Differential  Back pain - presumbe djd and flare  no alarm features othewise Labs today monitor followup in 6-8 weeks. Uncertain how much back pain on the right is contributing. Recheck anemia today. Samples given   Only have enough for 3-4 weeks but can call for more until stable -Patient advised to return or notify health care team  if symptoms worsen or persist or new concerns arise.  Patient Instructions  continue same meds bp better and may still decrease with time.  If medication is too expensive ask for samples or we can  Change to individula  components of medications  Will notify you  of labs when available.  [name] in  About 6- 8 weeks or as needed   bp today 138/70  142/70  [place] [name] M.D.

## 2012-12-05 DIAGNOSIS — M549 Dorsalgia, unspecified: Secondary | ICD-10-CM | POA: Insufficient documentation

## 2012-12-06 ENCOUNTER — Other Ambulatory Visit: Payer: Self-pay | Admitting: Family Medicine

## 2012-12-06 ENCOUNTER — Telehealth: Payer: Self-pay | Admitting: Internal Medicine

## 2012-12-06 DIAGNOSIS — E876 Hypokalemia: Secondary | ICD-10-CM

## 2012-12-06 NOTE — Telephone Encounter (Signed)
Pt is leaving at 4pm and returning your call your. Pls call

## 2012-12-17 ENCOUNTER — Other Ambulatory Visit: Payer: Self-pay | Admitting: Family Medicine

## 2012-12-17 MED ORDER — POTASSIUM CHLORIDE CRYS ER 10 MEQ PO TBCR: 20.0000 meq | EXTENDED_RELEASE_TABLET | Freq: Every day | ORAL | Status: DC

## 2012-12-17 NOTE — Telephone Encounter (Signed)
Returned phone call and spoke to the pt.  Informed her of lab results.  Have been trying to reach the pt for over 1 week.  She has recently had surgery.  Sent in new rx for potassium and she made future lab appt for 2 weeks.

## 2012-12-17 NOTE — Telephone Encounter (Signed)
Pt returning your call and will be at this number until 4pm. Pls call.

## 2013-01-01 ENCOUNTER — Other Ambulatory Visit (INDEPENDENT_AMBULATORY_CARE_PROVIDER_SITE_OTHER): Payer: Medicare Other

## 2013-01-01 DIAGNOSIS — E876 Hypokalemia: Secondary | ICD-10-CM

## 2013-01-01 LAB — MAGNESIUM: Magnesium: 1.8 mg/dL (ref 1.5–2.5)

## 2013-01-01 LAB — BASIC METABOLIC PANEL
CO2: 27 mEq/L (ref 19–32)
Chloride: 104 mEq/L (ref 96–112)
Potassium: 3.6 mEq/L (ref 3.5–5.1)
Sodium: 138 mEq/L (ref 135–145)

## 2013-01-20 ENCOUNTER — Encounter: Payer: Self-pay | Admitting: Internal Medicine

## 2013-01-20 ENCOUNTER — Ambulatory Visit (INDEPENDENT_AMBULATORY_CARE_PROVIDER_SITE_OTHER): Payer: Medicare Other | Admitting: Internal Medicine

## 2013-01-20 VITALS — BP 148/72 | HR 59 | Temp 98.2°F | Wt 185.0 lb

## 2013-01-20 DIAGNOSIS — F411 Generalized anxiety disorder: Secondary | ICD-10-CM | POA: Insufficient documentation

## 2013-01-20 DIAGNOSIS — N289 Disorder of kidney and ureter, unspecified: Secondary | ICD-10-CM

## 2013-01-20 DIAGNOSIS — D649 Anemia, unspecified: Secondary | ICD-10-CM

## 2013-01-20 DIAGNOSIS — R252 Cramp and spasm: Secondary | ICD-10-CM

## 2013-01-20 DIAGNOSIS — I10 Essential (primary) hypertension: Secondary | ICD-10-CM

## 2013-01-20 DIAGNOSIS — M549 Dorsalgia, unspecified: Secondary | ICD-10-CM

## 2013-01-20 DIAGNOSIS — R519 Headache, unspecified: Secondary | ICD-10-CM | POA: Insufficient documentation

## 2013-01-20 DIAGNOSIS — Z23 Encounter for immunization: Secondary | ICD-10-CM

## 2013-01-20 DIAGNOSIS — R51 Headache: Secondary | ICD-10-CM

## 2013-01-20 MED ORDER — ALPRAZOLAM 0.25 MG PO TABS: ORAL_TABLET | ORAL | Status: DC

## 2013-01-20 MED ORDER — OLMESARTAN-AMLODIPINE-HCTZ 40-5-25 MG PO TABS: 1.0000 | ORAL_TABLET | Freq: Every day | ORAL | Status: DC

## 2013-01-20 MED ORDER — FLUTICASONE-SALMETEROL 250-50 MCG/DOSE IN AEPB: 1.0000 | INHALATION_SPRAY | Freq: Two times a day (BID) | RESPIRATORY_TRACT | Status: DC

## 2013-01-20 NOTE — Progress Notes (Signed)
Chief Complaint  Patient presents with  . Follow-up    HPI: Patient comes in today for follow up of  multiple medical problems.   BP: Taking medication daily readings she has listed 166 one time 138/70 142/74 142/66 and 144/66 some days on Tylenol some after Xanax which helps it go down.  His been taking Xanax off and on for quite a while where she takes a half at night and sometimes a half twice a day when she gets anxious that she calls anxious in her upper chest. She states she doesn't take it every day one bottle usually lasts her 3 months but she is out of and asked for another prescription  Anemia noted April in hosp nl iron studies and  b12 no active bleeding. Due for colonoscopy?  Left back and leg pain  Worse in am had injections and legs.   A bit better.  Due for colonoscopy  ROS: See pertinent positives and negatives per HPI. Getting some leg cramps off and on down to her feet. More on the left side.  In the last 3-4 days she has had a headache that comes and goes and she takes Tylenol more on her left frontal area and over to the side without associated symptoms of photophobia nausea vomiting. Remote history of migraines might have some mild congestion but no fever or vision changes has had an eye check in the last month or 2.  Asked for refill the Advair her breathing is stable.  Past Medical History  Diagnosis Date  . Asthma     " onset when young"   . History of DVT (deep vein thrombosis)     "RLE" (09/09/2012)  . Allergy   . GERD (gastroesophageal reflux disease)   . Hyperlipidemia   . Hypertension   . Varicose veins   . Shortness of breath     "when I get a cold" (09/09/2012)  . Lupus     "cured years ago" (09/09/2012)  . Headache(784.0)     "related to my high blood pressure" (09/09/2012)  . Arthritis     "shoulders" (09/09/2012)    Family History  Problem Relation Age of Onset  . Hypertension Mother   . Hypertension Father   . Cancer Sister     Colon  and Breast Cancer  . Hypertension Brother   . Heart attack Daughter   . Lung cancer      both parents     History   Social History  . Marital Status: Single    Spouse Name: N/A    Number of Children: N/A  . Years of Education: N/A   Social History Main Topics  . Smoking status: Never Smoker   . Smokeless tobacco: Never Used  . Alcohol Use: No  . Drug Use: No  . Sexual Activity: No   Other Topics Concern  . None   Social History Narrative   2 people living in the home.  Grand daughter.   Up and down through the night      Had 7 children  2 deceased .   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    works 3 days per week currently .   Glasses dentures  Neg tad     Outpatient Encounter Prescriptions as of 01/20/2013  Medication Sig Dispense Refill  . albuterol (PROVENTIL HFA;VENTOLIN HFA) 108 (90 BASE) MCG/ACT inhaler Inhale 2 puffs into the lungs every 6 (six) hours  as needed for wheezing. For wheezing      . albuterol (PROVENTIL) (2.5 MG/3ML) 0.083% nebulizer solution Take 2.5 mg by nebulization every 6 (six) hours as needed. For wheezing      . ALPRAZolam (XANAX) 0.25 MG tablet Take half to one whole tablet bid prn  60 tablet  0  . clopidogrel (PLAVIX) 75 MG tablet Take 75 mg by mouth daily.       . famotidine (PEPCID) 40 MG tablet Take 40 mg by mouth. In the evening      . fenofibrate 160 MG tablet Take 160 mg by mouth daily.       . Fluticasone-Salmeterol (ADVAIR DISKUS) 250-50 MCG/DOSE AEPB Inhale 1 puff into the lungs 2 (two) times daily.  60 each  5  . metoprolol (LOPRESSOR) 50 MG tablet Take 50 mg by mouth every morning.       . montelukast (SINGULAIR) 10 MG tablet Take 10 mg by mouth every morning.       . Olmesartan-Amlodipine-HCTZ (TRIBENZOR) 40-5-25 MG TABS Take 1 capsule by mouth daily.  30 tablet  3  . potassium chloride (K-DUR,KLOR-CON) 10 MEQ tablet Take 2 tablets (20 mEq total) by mouth daily.  60 tablet  1  . vitamin B-12  (CYANOCOBALAMIN) 1000 MCG tablet Take 1,000 mcg by mouth daily.      . [DISCONTINUED] ADVAIR DISKUS 250-50 MCG/DOSE AEPB       . [DISCONTINUED] ALPRAZolam (XANAX) 0.25 MG tablet       . [DISCONTINUED] Olmesartan-Amlodipine-HCTZ (TRIBENZOR) 40-5-25 MG TABS Take 1 capsule by mouth daily.  30 tablet  3  . traMADol (ULTRAM) 50 MG tablet        No facility-administered encounter medications on file as of 01/20/2013.    EXAM:  BP 148/72  Pulse 59  Temp(Src) 98.2 F (36.8 C) (Oral)  Wt 185 lb (83.915 kg)  BMI 34.97 kg/m2  SpO2 98%  Body mass index is 34.97 kg/(m^2).  GENERAL: vitals reviewed and listed above, alert, oriented, appears well hydrated and in no acute distress  HEENT: atraumatic, conjunctiva  clear, no obvious abnormalities on inspection of external nose and ears TMs are clear no temporal lesions or tenderness. No frontal tenderness mild nasal congestion OP : no lesion edema or exudate   NECK: no obvious masses on inspection palpation Ruis are heard  LUNGS: clear to auscultation bilaterally, no wheezes, rales or rhonchi, good air movement  CV: HRRR, no clubbing cyanosis or +1 to +2 edema of her feet no lesions seen nl cap refill  MS: moves all extremities   PSYCH: pleasant and cooperative, no obvious depression or anxiety Lab Results  Component Value Date   WBC 4.7 11/25/2012   HGB 11.2* 11/25/2012   HCT 34.1* 11/25/2012   PLT 215.0 11/25/2012   GLUCOSE 111* 01/01/2013   CHOL 171 09/09/2012   TRIG 148 09/09/2012   HDL 47 09/09/2012   LDLCALC 94 09/09/2012   NA 138 01/01/2013   K 3.6 01/01/2013   CL 104 01/01/2013   CREATININE 1.4* 01/01/2013   BUN 28* 01/01/2013   CO2 27 01/01/2013   TSH 1.786 09/09/2012   INR 0.94 12/22/2009    ASSESSMENT AND PLAN:  Discussed the following assessment and plan:  HTN (hypertension) - Better below 150 seem to tolerate medication samples and Rx given continue at this time - Plan: Flu Vaccine QUAD 36+ mos PF IM (Fluarix), CBC with  Differential, Basic metabolic panel, C-reactive protein, Sedimentation rate, Immunofixation Electrophoresis, Serum  Need  for prophylactic vaccination and inoculation against influenza - Plan: Flu Vaccine QUAD 36+ mos PF IM (Fluarix), CBC with Differential, Basic metabolic panel, C-reactive protein, Sedimentation rate, Immunofixation Electrophoresis, Serum  Anemia - Possibly from renal insufficiency rule out other probably needs colonoscopy uncertain colon cancer screening - Plan: Flu Vaccine QUAD 36+ mos PF IM (Fluarix), CBC with Differential, Basic metabolic panel, C-reactive protein, Sedimentation rate, Immunofixation Electrophoresis, Serum  Back pain - Plan: Flu Vaccine QUAD 36+ mos PF IM (Fluarix), CBC with Differential, Basic metabolic panel, C-reactive protein, Sedimentation rate, Immunofixation Electrophoresis, Serum  Headache(784.0) - No alarm features except for new or onset remote history of migraines we'll follow check sedimentation rate CBC - Plan: Flu Vaccine QUAD 36+ mos PF IM (Fluarix), CBC with Differential, Basic metabolic panel, C-reactive protein, Sedimentation rate, Immunofixation Electrophoresis, Serum  Renal insufficiency creatinine 1.3 - Plan: Flu Vaccine QUAD 36+ mos PF IM (Fluarix), CBC with Differential, Basic metabolic panel, C-reactive protein, Sedimentation rate, Immunofixation Electrophoresis, Serum  Leg cramps - Question of medication adding to her potassium is low normal but still normal can add magnesium also.  Anxiety state, unspecified Anxiety using Xanax for a while caution dependency rebound anxiety refill 60 no refills minimize use. She has been off and on of this for a while I'm not sure what other options for treatment. Told her of risk-benefit of this medication especially in her age group. I.e. falling etc. she seems to tolerate it well.  She will check into the cost of his shingles vaccine and receive it if possible -Patient advised to return or notify  health care team  if symptoms worsen or persist or new concerns arise.  Patient Instructions   Get your  Colonoscopy .   Let us know if you need a referral.  Your anemia could be from a lot of reasons.  Sometimes  It can be from  Kidney problems  But consideration of  Hematology to see you to make sure you dont have any other  Causes of the anema.  Continue same medications   Including the potassium   Add a magnesium pill mag oxide  Once per day and seeif helps the cramps.   Repeat anemia check today .   Then may do a referral if needed to the hematologist.   [name] in 3 months or as needed    Mariann Laster K. Lekeya Rollings M.D.

## 2013-01-20 NOTE — Patient Instructions (Signed)
   Get your  Colonoscopy .   Let us know if you need a referral.  Your anemia could be from a lot of reasons.  Sometimes  It can be from  Kidney problems  But consideration of  Hematology to see you to make sure you dont have any other  Causes of the anema.  Continue same medications   Including the potassium   Add a magnesium pill mag oxide  Once per day and seeif helps the cramps.   Repeat anemia check today .   Then may do a referral if needed to the hematologist.   [name] in 3 months or as needed

## 2013-01-21 LAB — BASIC METABOLIC PANEL
BUN: 22 mg/dL (ref 6–23)
CO2: 29 mEq/L (ref 19–32)
Calcium: 9.5 mg/dL (ref 8.4–10.5)
Glucose, Bld: 119 mg/dL — ABNORMAL HIGH (ref 70–99)
Sodium: 141 mEq/L (ref 135–145)

## 2013-01-21 LAB — CBC WITH DIFFERENTIAL/PLATELET
Basophils Relative: 0.6 % (ref 0.0–3.0)
Eosinophils Relative: 3.1 % (ref 0.0–5.0)
HCT: 31.5 % — ABNORMAL LOW (ref 36.0–46.0)
Hemoglobin: 10.6 g/dL — ABNORMAL LOW (ref 12.0–15.0)
Lymphs Abs: 0.9 10*3/uL (ref 0.7–4.0)
Monocytes Relative: 6.8 % (ref 3.0–12.0)
Neutro Abs: 2.2 10*3/uL (ref 1.4–7.7)
Platelets: 207 10*3/uL (ref 150.0–400.0)
RBC: 3.69 Mil/uL — ABNORMAL LOW (ref 3.87–5.11)
WBC: 3.6 10*3/uL — ABNORMAL LOW (ref 4.5–10.5)

## 2013-01-21 NOTE — Addendum Note (Signed)
Addended by: Elmer Picker on: 01/21/2013 01:03 PM   Modules accepted: Orders

## 2013-01-23 LAB — IMMUNOFIXATION ELECTROPHORESIS
IgM, Serum: 73 mg/dL (ref 52–322)
Total Protein, Serum Electrophoresis: 6.4 g/dL (ref 6.0–8.3)

## 2013-01-28 ENCOUNTER — Encounter (HOSPITAL_COMMUNITY): Payer: Self-pay | Admitting: *Deleted

## 2013-01-28 ENCOUNTER — Emergency Department (HOSPITAL_COMMUNITY)
Admission: EM | Admit: 2013-01-28 | Discharge: 2013-01-29 | Disposition: A | Discharge status: Home / Self Care | Payer: No Typology Code available for payment source | Attending: Emergency Medicine | Admitting: Emergency Medicine

## 2013-01-28 DIAGNOSIS — S298XXA Other specified injuries of thorax, initial encounter: Secondary | ICD-10-CM | POA: Insufficient documentation

## 2013-01-28 DIAGNOSIS — M791 Myalgia, unspecified site: Secondary | ICD-10-CM

## 2013-01-28 DIAGNOSIS — Z8639 Personal history of other endocrine, nutritional and metabolic disease: Secondary | ICD-10-CM | POA: Insufficient documentation

## 2013-01-28 DIAGNOSIS — Z79899 Other long term (current) drug therapy: Secondary | ICD-10-CM | POA: Insufficient documentation

## 2013-01-28 DIAGNOSIS — Z86718 Personal history of other venous thrombosis and embolism: Secondary | ICD-10-CM | POA: Insufficient documentation

## 2013-01-28 DIAGNOSIS — Z862 Personal history of diseases of the blood and blood-forming organs and certain disorders involving the immune mechanism: Secondary | ICD-10-CM | POA: Insufficient documentation

## 2013-01-28 DIAGNOSIS — J45901 Unspecified asthma with (acute) exacerbation: Secondary | ICD-10-CM | POA: Insufficient documentation

## 2013-01-28 DIAGNOSIS — S0990XA Unspecified injury of head, initial encounter: Secondary | ICD-10-CM | POA: Insufficient documentation

## 2013-01-28 DIAGNOSIS — Z8739 Personal history of other diseases of the musculoskeletal system and connective tissue: Secondary | ICD-10-CM | POA: Insufficient documentation

## 2013-01-28 DIAGNOSIS — Y92410 Unspecified street and highway as the place of occurrence of the external cause: Secondary | ICD-10-CM | POA: Insufficient documentation

## 2013-01-28 DIAGNOSIS — Z880 Allergy status to penicillin: Secondary | ICD-10-CM | POA: Insufficient documentation

## 2013-01-28 DIAGNOSIS — Y9389 Activity, other specified: Secondary | ICD-10-CM | POA: Insufficient documentation

## 2013-01-28 DIAGNOSIS — K219 Gastro-esophageal reflux disease without esophagitis: Secondary | ICD-10-CM | POA: Insufficient documentation

## 2013-01-28 DIAGNOSIS — I10 Essential (primary) hypertension: Secondary | ICD-10-CM | POA: Insufficient documentation
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
## 2013-01-29 ENCOUNTER — Emergency Department (HOSPITAL_COMMUNITY): Payer: No Typology Code available for payment source

## 2013-01-29 MED ORDER — OXYCODONE-ACETAMINOPHEN 5-325 MG PO TABS: 1.0000 | ORAL_TABLET | Freq: Four times a day (QID) | ORAL | Status: DC | PRN

## 2013-01-29 MED ORDER — OXYCODONE-ACETAMINOPHEN 5-325 MG PO TABS
1.0000 | ORAL_TABLET | Freq: Once | ORAL | Status: AC
  Administered 2013-01-29: 1 via ORAL
  Filled 2013-01-29: qty 1

## 2013-01-29 MED ORDER — ACETAMINOPHEN 325 MG PO TABS
650.0000 mg | ORAL_TABLET | Freq: Once | ORAL | Status: AC
  Administered 2013-01-29: 650 mg via ORAL
  Filled 2013-01-29: qty 2
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
## 2013-01-31 ENCOUNTER — Telehealth: Payer: Self-pay | Admitting: Internal Medicine
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
## 2013-01-31 NOTE — Telephone Encounter (Signed)
Pt cannot come in for the 3:30 appt w/ Dr Sarajane Jews on Monday. Does it need to be Dr Sarajane Jews ? Pt states she can wait for Dr Regis Bill if you want her to. pls advise. Pt would like to know why you called her yesterday am also.

## 2013-02-03 ENCOUNTER — Ambulatory Visit: Payer: Medicare Other | Admitting: Family Medicine

## 2013-02-04 ENCOUNTER — Other Ambulatory Visit: Payer: Self-pay | Admitting: Family Medicine

## 2013-02-04 DIAGNOSIS — Z1211 Encounter for screening for malignant neoplasm of colon: Secondary | ICD-10-CM

## 2013-02-04 DIAGNOSIS — D649 Anemia, unspecified: Secondary | ICD-10-CM

## 2013-02-05 ENCOUNTER — Encounter: Payer: Self-pay | Admitting: Nurse Practitioner

## 2013-02-05 ENCOUNTER — Telehealth: Payer: Self-pay | Admitting: Internal Medicine

## 2013-02-05 NOTE — Telephone Encounter (Signed)
lvom for pt to return call in re to referral.  °

## 2013-02-06 ENCOUNTER — Telehealth: Payer: Self-pay | Admitting: Internal Medicine

## 2013-02-06 NOTE — Telephone Encounter (Signed)
S/w pt and gve np appt 10/20 @ 1:30 w/Dr. Juliann Mule Referring Dr. Regis Bill Dx- Anemia Welcome packet mailed.

## 2013-02-06 NOTE — Telephone Encounter (Signed)
Ok to wait and  See me  .

## 2013-02-06 NOTE — Telephone Encounter (Signed)
C/D 02/06/13 for appt. 10/20

## 2013-02-10 ENCOUNTER — Other Ambulatory Visit: Payer: Self-pay | Admitting: Internal Medicine

## 2013-02-11 ENCOUNTER — Other Ambulatory Visit: Payer: Self-pay | Admitting: Family Medicine

## 2013-02-11 ENCOUNTER — Ambulatory Visit (INDEPENDENT_AMBULATORY_CARE_PROVIDER_SITE_OTHER): Payer: Medicare Other | Admitting: Nurse Practitioner

## 2013-02-11 ENCOUNTER — Encounter: Payer: Self-pay | Admitting: Internal Medicine

## 2013-02-11 ENCOUNTER — Encounter: Payer: Self-pay | Admitting: Nurse Practitioner

## 2013-02-11 ENCOUNTER — Telehealth: Payer: Self-pay | Admitting: Gastroenterology

## 2013-02-11 ENCOUNTER — Telehealth: Payer: Self-pay | Admitting: Family Medicine

## 2013-02-11 VITALS — BP 170/70 | HR 65 | Ht 61.0 in | Wt 185.0 lb

## 2013-02-11 DIAGNOSIS — D649 Anemia, unspecified: Secondary | ICD-10-CM

## 2013-02-11 DIAGNOSIS — R1319 Other dysphagia: Secondary | ICD-10-CM

## 2013-02-11 DIAGNOSIS — Z800 Family history of malignant neoplasm of digestive organs: Secondary | ICD-10-CM

## 2013-02-11 DIAGNOSIS — Z1211 Encounter for screening for malignant neoplasm of colon: Secondary | ICD-10-CM

## 2013-02-11 DIAGNOSIS — R1310 Dysphagia, unspecified: Secondary | ICD-10-CM

## 2013-02-11 HISTORY — DX: Other dysphagia: R13.19

## 2013-02-11 HISTORY — DX: Encounter for screening for malignant neoplasm of colon: Z12.11

## 2013-02-11 MED ORDER — MONTELUKAST SODIUM 10 MG PO TABS: 10.0000 mg | ORAL_TABLET | Freq: Every morning | ORAL | Status: DC

## 2013-02-11 MED ORDER — MOVIPREP 100 G PO SOLR: ORAL | Status: DC

## 2013-02-11 MED ORDER — FENOFIBRATE 160 MG PO TABS: 160.0000 mg | ORAL_TABLET | Freq: Every day | ORAL | Status: DC

## 2013-02-11 NOTE — Telephone Encounter (Signed)
Ok to refill each for 6 months

## 2013-02-11 NOTE — Telephone Encounter (Signed)
Since she is not on this for stent and other active cardiac disease noted  And because she is allergic to asa  Can stop 5 days pre procedure.

## 2013-02-11 NOTE — Telephone Encounter (Signed)
Sent by e-scribe to the pharmacy.

## 2013-02-11 NOTE — Patient Instructions (Addendum)
You have been scheduled for a colonoscopy/endoscopy with propofol. Please follow written instructions given to you at your visit today.  Please pick up your prep kit at the pharmacy within the next 1-3 days. If you use inhalers (even only as needed), please bring them with you on the day of your procedure. Your physician has requested that you go to www.[email or web address].com and enter the access code given to you at your visit today. This web site gives a general overview about your procedure. However, you should still follow specific instructions given to you by our office regarding your preparation for the procedure.   We have sent the following medications to your pharmacy for you to pick up at your convenience: [email or web address]                                               We are excited to introduce MyChart, a new best-in-class service that provides you online access to important information in your electronic medical record. We want to make it easier for you to view your health information - all in one secure location - when and where you need it. We expect MyChart will enhance the quality of care and service we provide.  When you register for MyChart, you can:    View your test results.    Request appointments and receive appointment reminders via email.    Request medication renewals.    View your medical history, allergies, medications and immunizations.    Communicate with your physician's office through a password-protected site.    Conveniently print information such as your medication lists.  To find out if MyChart is right for you, please talk to a member of our clinical staff today. We will gladly answer your questions about this free health and wellness tool.  If you are age 77 or older and want a member of your family to have access to your record, you must provide written consent by completing a proxy form available at our office. Please speak to our clinical staff about guidelines  regarding accounts for patients younger than age 75.  As you activate your MyChart account and need any technical assistance, please call the MyChart technical support line at [phone]-CHART [phone]) or email your question to [email or web address]. If you email your question(s), please include your name, a return phone number and the best time to reach you.  If you have non-urgent health-related questions, you can send a message to our office through [place] at [email or web address]. If you have a medical emergency, call 911.  Thank you for using MyChart as your new health and wellness resource!   MyChart licensed from [employer],  1999-2010. Patents Pending.
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
## 2013-02-11 NOTE — Telephone Encounter (Signed)
02/11/2013    RE: [name] DOB: July 01, 1933 MRN: [id number]   Dear Dr. Regis Bill,    We have scheduled the above patient for an endoscopic procedure. Our records show that she is on anticoagulation therapy.   Please advise as to how long the patient may come off her therapy of Plavix prior to the procedure, which is scheduled for 03/19/2013.  Please fax back/ or route the completed form to Dottie/Majel Giel at [phone].   Sincerely,  Addisen Chappelle CMA-AAMA

## 2013-02-11 NOTE — Telephone Encounter (Signed)
Patient is requesting refills of both medications.  Looks like Dr. Katherine Roan was filling these medications.  She has not seen him since 02/17/2011 according to the chart.  Please advise.  Thanks!

## 2013-02-11 NOTE — Progress Notes (Signed)
Reviewed and agree.

## 2013-02-12 NOTE — Telephone Encounter (Signed)
Let pt know she is to hold Plavix 5 days prior to Procedure per Dr. Regis Bill.

## 2013-03-03 ENCOUNTER — Ambulatory Visit (HOSPITAL_BASED_OUTPATIENT_CLINIC_OR_DEPARTMENT_OTHER): Payer: Medicare Other | Admitting: Internal Medicine

## 2013-03-03 ENCOUNTER — Telehealth: Payer: Self-pay | Admitting: Internal Medicine

## 2013-03-03 ENCOUNTER — Ambulatory Visit: Payer: Medicare Other

## 2013-03-03 ENCOUNTER — Ambulatory Visit (HOSPITAL_BASED_OUTPATIENT_CLINIC_OR_DEPARTMENT_OTHER): Payer: Medicare Other | Admitting: Lab

## 2013-03-03 ENCOUNTER — Encounter: Payer: Self-pay | Admitting: Internal Medicine

## 2013-03-03 VITALS — BP 175/65 | HR 61 | Temp 97.8°F | Resp 20 | Ht 61.0 in | Wt 183.7 lb

## 2013-03-03 DIAGNOSIS — D649 Anemia, unspecified: Secondary | ICD-10-CM

## 2013-03-03 DIAGNOSIS — Z86718 Personal history of other venous thrombosis and embolism: Secondary | ICD-10-CM

## 2013-03-03 DIAGNOSIS — K219 Gastro-esophageal reflux disease without esophagitis: Secondary | ICD-10-CM

## 2013-03-03 DIAGNOSIS — N189 Chronic kidney disease, unspecified: Secondary | ICD-10-CM

## 2013-03-03 DIAGNOSIS — I10 Essential (primary) hypertension: Secondary | ICD-10-CM

## 2013-03-03 LAB — FERRITIN CHCC: Ferritin: 93 ng/mL (ref 9–269)

## 2013-03-03 LAB — CBC WITH DIFFERENTIAL/PLATELET
Basophils Absolute: 0 10*3/uL (ref 0.0–0.1)
Eosinophils Absolute: 0.1 10*3/uL (ref 0.0–0.5)
HCT: 35.5 % (ref 34.8–46.6)
HGB: 11.5 g/dL — ABNORMAL LOW (ref 11.6–15.9)
MCH: 27.8 pg (ref 25.1–34.0)
MONO#: 0.4 10*3/uL (ref 0.1–0.9)
NEUT#: 2.5 10*3/uL (ref 1.5–6.5)
NEUT%: 51.7 % (ref 38.4–76.8)
WBC: 4.7 10*3/uL (ref 3.9–10.3)
lymph#: 1.7 10*3/uL (ref 0.9–3.3)

## 2013-03-03 LAB — COMPREHENSIVE METABOLIC PANEL (CC13)
ALT: 9 U/L (ref 0–55)
AST: 24 U/L (ref 5–34)
Albumin: 3.8 g/dL (ref 3.5–5.0)
Alkaline Phosphatase: 36 U/L — ABNORMAL LOW (ref 40–150)
Anion Gap: 11 meq/L (ref 3–11)
BUN: 22.6 mg/dL (ref 7.0–26.0)
CO2: 28 meq/L (ref 22–29)
Calcium: 10.4 mg/dL (ref 8.4–10.4)
Chloride: 105 meq/L (ref 98–109)
Creatinine: 1.3 mg/dL — ABNORMAL HIGH (ref 0.6–1.1)
Glucose: 126 mg/dL (ref 70–140)
Potassium: 4 meq/L (ref 3.5–5.1)
Sodium: 144 meq/L (ref 136–145)
Total Bilirubin: 0.47 mg/dL (ref 0.20–1.20)
Total Protein: 7.4 g/dL (ref 6.4–8.3)

## 2013-03-03 LAB — IRON AND TIBC CHCC: Iron: 75 ug/dL (ref 41–142)

## 2013-03-03 NOTE — Patient Instructions (Signed)
Colonoscopy A colonoscopy is an exam to evaluate your entire colon. In this exam, your colon is cleansed. A long fiberoptic tube is inserted through your rectum and into your colon. The fiberoptic scope (endoscope) is a long bundle of enclosed and very flexible fibers. These fibers transmit light to the area examined and send images from that area to your caregiver. Discomfort is usually minimal. You may be given a drug to help you sleep (sedative) during or prior to the procedure. This exam helps to detect lumps (tumors), polyps, inflammation, and areas of bleeding. Your caregiver may also take a small piece of tissue (biopsy) that will be examined under a microscope. LET YOUR CAREGIVER KNOW ABOUT:   Allergies to food or medicine.  Medicines taken, including vitamins, herbs, eyedrops, over-the-counter medicines, and creams.  Use of steroids (by mouth or creams).  Previous problems with anesthetics or numbing medicines.  History of bleeding problems or blood clots.  Previous surgery.  Other health problems, including diabetes and kidney problems.  Possibility of pregnancy, if this applies. BEFORE THE PROCEDURE   A clear liquid diet may be required for 2 days before the exam.  Ask your caregiver about changing or stopping your regular medications.  Liquid injections (enemas) or laxatives may be required.  A large amount of electrolyte solution may be given to you to drink over a short period of time. This solution is used to clean out your colon.  You should be present 60 minutes prior to your procedure or as directed by your caregiver. AFTER THE PROCEDURE   If you received a sedative or pain relieving medication, you will need to arrange for someone to drive you home.  Occasionally, there is a little blood passed with the first bowel movement. Do not be concerned. FINDING OUT THE RESULTS OF YOUR TEST Not all test results are available during your visit. If your test results are  not back during the visit, make an appointment with your caregiver to find out the results. Do not assume everything is normal if you have not heard from your caregiver or the medical facility. It is important for you to follow up on all of your test results. HOME CARE INSTRUCTIONS   It is not unusual to pass moderate amounts of gas and experience mild abdominal cramping following the procedure. This is due to air being used to inflate your colon during the exam. Walking or a warm pack on your belly (abdomen) may help.  You may resume all normal meals and activities after sedatives and medicines have worn off.  Only take over-the-counter or prescription medicines for pain, discomfort, or fever as directed by your caregiver. Do not use aspirin or blood thinners if a biopsy was taken. Consult your caregiver for medicine usage if biopsies were taken. SEEK IMMEDIATE MEDICAL CARE IF:   You have a fever.  You pass large blood clots or fill a toilet with blood following the procedure. This may also occur 10 to 14 days following the procedure. This is more likely if a biopsy was taken.  You develop abdominal pain that keeps getting worse and cannot be relieved with medicine. Document Released: 04/28/2000 Document Revised: 07/24/2011 Document Reviewed: 12/12/2007 [facility] Patient Information 2014 [place]. Anemia, Frequently Asked Questions WHAT ARE THE SYMPTOMS OF ANEMIA?  Headache.  Difficulty thinking.  Fatigue.  Shortness of breath.  Weakness.  Rapid heartbeat. AT WHAT POINT ARE PEOPLE CONSIDERED ANEMIC?  This varies with gender and age.   Both hemoglobin (Hgb)  and hematocrit values are used to define anemia. These lab values are obtained from a complete blood count (CBC) test. This is performed at a caregiver's office.  The normal range of hemoglobin values for adult men is 14.0 g/dL to 17.4 g/dL. For nonpregnant women, values are 12.3 g/dL to 15.3 g/dL.  The World Health  Organization defines anemia as less than 12 g/dL for nonpregnant women and less than 13 g/dL for men.  For adult males, the average normal hematocrit is 46%, and the range is 40% to 52%.  For adult females, the average normal hematocrit is 41%, and the range is 35% to 47%.  Values that fall below the lower limits can be a sign of anemia and should have further checking (evaluation). GROUPS OF PEOPLE WHO ARE AT RISK FOR DEVELOPING ANEMIA INCLUDE:   Infants who are breastfed or taking a formula that is not fortified with iron.  Children going through a rapid growth spurt. The iron available can not keep up with the needs for a red cell mass which must grow with the child.  Women in childbearing years. They need iron because of blood loss during menstruation.  Pregnant women. The growing fetus creates a high demand for iron.  People with ongoing gastrointestinal blood loss are at risk of developing iron deficiency.  Individuals with leukemia or cancer who must receive chemotherapy or radiation to treat their disease. The drugs or radiation used to treat these diseases often decreases the bone marrow's ability to make cells of all classes. This includes red blood cells, white blood cells, and platelets.  Individuals with chronic inflammatory conditions such as rheumatoid arthritis or chronic infections.  The elderly. ARE SOME TYPES OF ANEMIA INHERITED?   Yes, some types of anemia are due to inherited or genetic defects.  Sickle cell anemia. This occurs most often in people of African, African American, and Mediterranean descent.  Thalassemia (or Cooley's anemia). This type is found in people of [place] and [place] descent. These types of anemia are common.  [place]. This is rare. CAN CERTAIN MEDICATIONS CAUSE A PERSON TO BECOME ANEMIC?  Yes. For example, drugs to fight cancer (chemotherapeutic agents) often cause anemia. These drugs can slow the bone marrow's ability to  make red blood cells. If there are not enough red blood cells, the body does not get enough oxygen. WHAT HEMATOCRIT LEVEL IS REQUIRED TO DONATE BLOOD?  The lower limit of an acceptable hematocrit for blood donors is 38%. If you have a low hematocrit value, you should schedule an appointment with your caregiver. ARE BLOOD TRANSFUSIONS COMMONLY USED TO CORRECT ANEMIA, AND ARE THEY DANGEROUS?  They are used to treat anemia as a last resort. Your caregiver will find the cause of the anemia and correct it if possible. Most blood transfusions are given because of excessive bleeding at the time of surgery, with trauma, or because of bone marrow suppression in patients with cancer or leukemia on chemotherapy. Blood transfusions are safer than ever before. We also know that blood transfusions affect the immune system and may increase certain risks. There is also a concern for human error. In 1/16,000 transfusions, a patient receives a transfusion of blood that is not matched with his or her blood type.  WHAT IS IRON DEFICIENCY ANEMIA AND CAN I CORRECT IT BY CHANGING MY DIET?  Iron is an essential part of hemoglobin. Without enough hemoglobin, anemia develops and the body does not get the right amount of oxygen. Iron deficiency anemia  develops after the body has had a low level of iron for a long time. This is either caused by blood loss, not taking in or absorbing enough iron, or increased demands for iron (like pregnancy or rapid growth).  Foods from animal origin such as beef, chicken, and pork, are good sources of iron. Be sure to have one of these foods at each meal. Vitamin C helps your body absorb iron. Foods rich in Vitamin C include citrus, bell pepper, strawberries, spinach and cantaloupe. In some cases, iron supplements may be needed in order to correct the iron deficiency. In the case of poor absorption, extra iron may have to be given directly into the vein through a needle (intravenously). I HAVE BEEN  DIAGNOSED WITH IRON DEFICIENCY ANEMIA AND MY CAREGIVER PRESCRIBED IRON SUPPLEMENTS. HOW LONG WILL IT TAKE FOR MY BLOOD TO BECOME NORMAL?  It depends on the degree of anemia at the beginning of treatment. Most people with mild to moderate iron deficiency, anemia will correct the anemia over a period of 2 to 3 months. But after the anemia is corrected, the iron stored by the body is still low. Caregivers often suggest an additional 6 months of oral iron therapy once the anemia has been reversed. This will help prevent the iron deficiency anemia from quickly happening again. Non-anemic adult males should take iron supplements only under the direction of a doctor, too much iron can cause liver damage.  MY HEMOGLOBIN IS 9 G/DL AND I AM SCHEDULED FOR SURGERY. SHOULD I POSTPONE THE SURGERY?  If you have Hgb of 9, you should discuss this with your caregiver right away. Many patients with similar hemoglobin levels have had surgery without problems. If minimal blood loss is expected for a minor procedure, no treatment may be necessary.  If a greater blood loss is expected for more extensive procedures, you should ask your caregiver about being treated with erythropoietin and iron. This is to accelerate the recovery of your hemoglobin to a normal level before surgery. An anemic patient who undergoes high-blood-loss surgery has a greater risk of surgical complications and need for a blood transfusion, which also carries some risk.  I HAVE BEEN TOLD THAT HEAVY MENSTRUAL PERIODS CAUSE ANEMIA. IS THERE ANYTHING I CAN DO TO PREVENT THE ANEMIA?  Anemia that results from heavy periods is usually due to iron deficiency. You can try to meet the increased demands for iron caused by the heavy monthly blood loss by increasing the intake of iron-rich foods. Iron supplements may be required. Discuss your concerns with your caregiver. WHAT CAUSES ANEMIA DURING PREGNANCY?  Pregnancy places major demands on the body. The mother must  meet the needs of both her body and her growing baby. The body needs enough iron and folate to make the right amount of red blood cells. To prevent anemia while pregnant, the mother should stay in close contact with her caregiver.  Be sure to eat a diet that has foods rich in iron and folate like liver and dark green leafy vegetables. Folate plays an important role in the normal development of a baby's spinal cord. Folate can help prevent serious disorders like spina bifida. If your diet does not provide adequate nutrients, you may want to talk with your caregiver about nutritional supplements.  WHAT IS THE RELATIONSHIP BETWEEN FIBROID TUMORS AND ANEMIA IN WOMEN?  The relationship is usually caused by the increased menstrual blood loss caused by fibroids. Good iron intake may be required to prevent iron deficiency anemia  from developing.  Document Released: 12/08/2003 Document Revised: 07/24/2011 Document Reviewed: 05/24/2010 [facility] Patient Information 2014 [place], [place]. Iron Deficiency Anemia There are many types of anemia. Iron deficiency anemia is the most common. Iron deficiency anemia is a decrease in the number of red blood cells caused by too little iron. Without enough iron, your body does not produce enough hemoglobin. Hemoglobin is a substance in red blood cells that carries oxygen to the body's tissues. Iron deficiency anemia may leave you tired and short of breath. CAUSES   Lack of iron in the diet.  This may be seen in infants and children, because there is little iron in milk.  This may be seen in adults who do not eat enough iron-rich foods.  This may be seen in pregnant or breastfeeding women who do not take iron supplements. There is a much higher need for iron intake at these times.  Poor absorption of iron, as seen with intestinal disorders.  Intestinal bleeding.  Heavy periods. SYMPTOMS  Mild anemia may not be noticeable. Symptoms may  include:  Fatigue.  Headache.  Pale skin.  Weakness.  Shortness of breath.  Dizziness.  Cold hands and feet.  Fast or irregular heartbeat. DIAGNOSIS  Diagnosis requires a thorough evaluation and physical exam by your caregiver.  Blood tests are generally used to confirm iron deficiency anemia.  Additional tests may be done to find the underlying cause of your anemia. These may include:  Testing for blood in the stool (fecal occult blood test).  A procedure to see inside the colon and rectum (colonoscopy).  A procedure to see inside the esophagus and stomach (endoscopy). TREATMENT   Correcting the cause of the iron deficiency is the first step.  Medicines, such as oral contraceptives, can make heavy menstrual flows lighter.  Antibiotics and other medicines can be used to treat peptic ulcers.  Surgery may be needed to remove a bleeding polyp, tumor, or fibroid.  Often, iron supplements (ferrous sulfate) are taken.  For the best iron absorption, take these supplements with an empty stomach.  You may need to take the supplements with food if you cannot tolerate them on an empty stomach. Vitamin C improves the absorption of iron. Your caregiver may recommend taking your iron tablets with a glass of orange juice or vitamin C supplement.  Milk and antacids should not be taken at the same time as iron supplements. They may interfere with the absorption of iron.  Iron supplements can cause constipation. A stool softener is often recommended.  Pregnant and breastfeeding women will need to take extra iron, because their normal diet usually will not provide the required amount.  Patients who cannot tolerate iron by mouth can take it through a vein (intravenously) or by an injection into the muscle. HOME CARE INSTRUCTIONS   Ask your dietitian for help with diet questions.  Take iron and vitamins as directed by your caregiver.  Eat a diet rich in iron. Eat liver, lean  beef, whole-grain bread, eggs, dried fruit, and dark green leafy vegetables. SEEK IMMEDIATE MEDICAL CARE IF:   You have a fainting episode. Do not drive yourself. Call your local emergency services (911 in [place] if no other help is available.  You have chest pain, nausea, or vomiting.  You develop severe or increased shortness of breath with activities.  You develop weakness or increased thirst.  You have a rapid heartbeat.  You develop unexplained sweating or become lightheaded when getting up from a chair or bed.  MAKE SURE YOU:   Understand these instructions.  Will watch your condition.  Will get help right away if you are not doing well or get worse. Document Released: 04/28/2000 Document Revised: 07/24/2011 Document Reviewed: 09/07/2009 [facility] Patient Information 2014 [place].

## 2013-03-03 NOTE — Telephone Encounter (Signed)
Gave pt appt for lab, chemo class and Md visit on December 2014

## 2013-03-03 NOTE — Progress Notes (Signed)
[place] Telephone:([phone]) [phone]   Fax:([phone]) [phone]  NEW PATIENT EVALUATION   Name: [name] Date: 03/03/2013 MRN: [id number] DOB: June 14, 1933  PCP: [name], MD   REFERRING PHYSICIAN: Panosh, Standley Brooking, MD  REASON FOR REFERRAL: Anemia not otherwise specified  HISTORY OF PRESENT ILLNESS:[name] is a 79 y.o. female who is has a history of gastric esophageal reflux disease and anemia presents for further evaluation and management. Today she is accompanied by her daughter [name].  He was last seen by nurse practitioner Angela Craze do to dysphagia with plans for a EGD and colonoscopy scheduled for 03/19/2013.  A primary care doctor ([name]) highway the patient for anemia. On 11/25/2012 her hemoglobin was 11.2, on 01/21/2013 it was 10.6.  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 She states that sometimes that'll sort of tired. She continues eat ice but does night ice as much as she did in the past. She lives with her granddaughter and [place]. She complains of intermittent shoulder pain but otherwise she reports doing pretty well. Of note she's had left shoulder pain which has been evaluated in the past.   PAST MEDICAL HISTORY:  has a past medical history of Asthma; History of DVT (deep vein thrombosis); Allergy; GERD (gastroesophageal reflux disease); Hyperlipidemia; Hypertension; Varicose veins; Lupus; Headache(784.0); and Arthritis.     PAST SURGICAL HISTORY: Past Surgical History  Procedure Laterality Date  . Abdominal hysterectomy      partial  . Breast surgery    . Carpal tunnel release Right 2000's  . Shoulder open rotator cuff repair Bilateral 2000's     CURRENT MEDICATIONS: has a current medication list which includes the following prescription(s): albuterol, albuterol, alprazolam, clopidogrel, famotidine, fenofibrate, fluticasone-salmeterol, metoprolol, montelukast,  olmesartan-amlodipine-hctz, potassium chloride, vitamin b-12, and moviprep.   ALLERGIES: Aspirin and Penicillins   SOCIAL HISTORY:  reports that she has never smoked. She has never used smokeless tobacco. She reports that she does not drink alcohol or use illicit drugs.   FAMILY HISTORY: family history includes Cancer in her sister; Heart attack in her daughter; Hypertension in her brother, father, and mother; Lung cancer in an other family member.   LABORATORY DATA:  CMP     Component Value Date/Time   NA 144 03/03/2013 1449   NA 141 01/21/2013 1258   K 4.0 03/03/2013 1449   K 3.7 01/21/2013 1258   CL 106 01/21/2013 1258   CO2 28 03/03/2013 1449   CO2 29 01/21/2013 1258   GLUCOSE 126 03/03/2013 1449   GLUCOSE 119* 01/21/2013 1258   BUN 22.6 03/03/2013 1449   BUN 22 01/21/2013 1258   CREATININE 1.3* 03/03/2013 1449   CREATININE 1.2 01/21/2013 1258   CALCIUM 10.4 03/03/2013 1449   CALCIUM 9.5 01/21/2013 1258   PROT 7.4 03/03/2013 1449   ALBUMIN 3.8 03/03/2013 1449   AST 24 03/03/2013 1449   ALT 9 03/03/2013 1449   ALKPHOS 36* 03/03/2013 1449   BILITOT 0.47 03/03/2013 1449   GFRNONAA 38* 09/08/2012 1640   GFRAA 44* 09/08/2012 1640   CBC    Component Value Date/Time   WBC 4.7 03/03/2013 1449   WBC 3.6* 01/21/2013 1258   RBC 4.14 03/03/2013 1449   RBC 3.69* 01/21/2013 1258   RBC 4.09 09/08/2012 2050   HGB 11.5* 03/03/2013 1449   HGB 10.6* 01/21/2013 1258   HCT 35.5 03/03/2013 1449   HCT 31.5* 01/21/2013 1258   PLT  238 03/03/2013 1449   PLT 207.0 01/21/2013 1258   MCV 85.8 03/03/2013 1449   MCV 85.5 01/21/2013 1258   MCH 27.8 03/03/2013 1449   MCH 27.7 09/08/2012 1640   MCHC 32.4 03/03/2013 1449   MCHC 33.6 01/21/2013 1258   RDW 13.6 03/03/2013 1449   RDW 14.2 01/21/2013 1258   LYMPHSABS 1.7 03/03/2013 1449   LYMPHSABS 0.9 01/21/2013 1258   MONOABS 0.4 03/03/2013 1449   MONOABS 0.2 01/21/2013 1258   EOSABS 0.1 03/03/2013 1449   EOSABS 0.1 01/21/2013 1258   BASOSABS 0.0 03/03/2013 1449    BASOSABS 0.0 01/21/2013 1258   Iron/TIBC/Ferritin    Component Value Date/Time   IRON 75 03/03/2013 1449   IRON 81 09/08/2012 2050   TIBC 375 03/03/2013 1449   TIBC 352 09/08/2012 2050   FERRITIN 93 03/03/2013 1449   FERRITIN 60 09/08/2012 2050   Results for [name] (MRN [id number]) as of 03/04/2013 08:34  Ref. Range 01/21/2013 13:03  IgG (Immunoglobin G), Serum Latest Range: [id number] mg/dL 797  IgA Latest Range: 69-380 mg/dL 221  IgM, Serum Latest Range: 52-322 mg/dL 73  Total Protein, Serum Electrophoresis Latest Range: 6.0-8.3 g/dL 6.4    Results for [name] (MRN [id number]) as of 03/04/2013 08:34  Ref. Range 01/21/2013 12:58  CRP Latest Range: 0.5-20.0 mg/dL 0.9   Results for [name] (MRN [id number]) as of 03/04/2013 08:34  Ref. Range 01/21/2013 12:58  Sed Rate Latest Range: 0-22 mm/hr 12   Results for [name] (MRN [id number]) as of 03/04/2013 08:34  Ref. Range 09/09/2012 06:00  TSH Latest Range: 0.350-4.500 uIU/mL 1.786    Results for [name] (MRN [id number]) as of 03/04/2013 08:34  Ref. Range 09/08/2012 20:50  Folate No range found 10.9   Results for [name] (MRN [id number]) as of 03/04/2013 08:34  Ref. Range 09/08/2012 20:50  Vitamin B-12 Latest Range: 211-911 pg/mL 1620 (H)   RADIOGRAPHY: No results found.   REVIEW OF SYSTEMS:  Constitutional: Denies fevers, chills or abnormal weight loss Eyes: Denies blurriness of vision Ears, nose, mouth, throat, and face: Denies mucositis or sore throat Respiratory: Denies cough, dyspnea or wheezes Cardiovascular: Denies palpitation, chest discomfort or lower extremity swelling Gastrointestinal:  Denies nausea, heartburn or change in bowel habits Skin: Denies abnormal skin rashes Lymphatics: Denies new lymphadenopathy or easy bruising Neurological:Denies numbness, tingling or new weaknesses Behavioral/Psych: Mood is stable, no new changes  All other systems were reviewed with  the patient and are negative.  PHYSICAL EXAM:  height is 5\' 1"  (1.549 m) and weight is 183 lb 11.2 oz (83.326 kg). Her oral temperature is 97.8 F (36.6 C). Her blood pressure is 175/65 and her pulse is 61. Her respiration is 20.    GENERAL:alert, no distress and comfortable; elderly female who appears her stated age SKIN: skin color, texture, turgor are normal, no rashes or significant lesions EYES: normal, Conjunctiva are pink and non-injected, sclera clear; arcus senilis  OROPHARYNX:no exudate, no erythema and lips, buccal mucosa, and tongue normal  NECK: supple, thyroid normal size, non-tender, without nodularity LYMPH:  no palpable lymphadenopathy in the cervical, axillary or inguinal LUNGS: clear to auscultation and percussion with normal breathing effort HEART: regular rate & rhythm and no murmurs and 1+ lower extremity edema with chronic venous changes ABDOMEN:abdomen soft, non-tender and normal bowel sounds Musculoskeletal:no cyanosis of digits and no clubbing  NEURO: alert & oriented x 3 with fluent speech, no focal motor/sensory deficits  IMPRESSION: [name] is a 79 y.o. female with a history of mild renal disease, GERD and history of DVT is here for further evaluation of her anemia.   PLAN: 1.  Anemia likely anemia of chronic disease/kidney disease. -- Her iron studies does not indicate iron deficiency anemia with normal ferritin and normal iron levels of 75. Her hemoglobin remains mildly decreased at 11.5 with a hematocrit of 35.5. Her hemoglobin ranged from 10.6-11.5 over the last several months. Her white blood cell count is normal with 4.7 and platelets are normal at 238. Her MCV for her complete blood count is 85.8 with a normal RDW. This is indication of likely anemia of chronic disease. Her blood smear revealed a normocytic anemia. --Her screening SPEP was within normal limits. Both her ESR and CRP was within normal limits. Her TSH screening was negative. Her folate  level in April of this year was within normal limits. She is on vitamin B12 with her last level of 1620 in April.  Her anemia is likely secondary to her moderate chronic renal disease. Given her hemoglobin is over 11, there is no indications for erythropoietin. We can continue to monitor closely and treat her chronic renal disease.  2. Chronic Renal Disease, mild. --Her creatinine is 1.3 today with the remaining of her CMP unremarkable. Her creatinine ranged from 1.2-1.4 all the last few months. Patient has been counseled to 4 nephrotoxins and to continue adequate hydration. Patient should continue with tight glycemic control and maintain a good blood pressure to decrease the chance further decline in renal function.   3. GERD/dysphagia. --Patient is scheduled for an EGD and a colonoscopy on 03/19/2013. With iron index and her ferritin  within normal limits, this is less likely to be resulting in her mild anemia. Of note she has a first degree relative with a history of colon cancer.  4. History of DVT. --Patient was informed that patients with a history of DVT or more prone to DVT recurrence. He denies any symptoms of swelling in the legs and she reports mobilization. He also denies any acute shortness of breath.  5. Hypertension -Continue low salt diet and anti-hypertensive medications per PCP.  6. Follow-up --She will require a repeat CBC and chemistries in 2 months.   All questions were answered. The patient knows to call the clinic with any problems, questions or concerns. We can certainly see the patient much sooner if necessary.  We provided a handout on symptoms of anemia and an after visit summary.   I spent 25 minutes counseling the patient face to face. The total time spent in the appointment was 45 minutes.    Angela Altice, MD 03/03/2013 4:55 PM

## 2013-03-03 NOTE — Progress Notes (Signed)
Checked in new pt with no financial concerns. °

## 2013-03-06 ENCOUNTER — Telehealth: Payer: Self-pay | Admitting: *Deleted

## 2013-03-06 NOTE — Telephone Encounter (Signed)
Pt left VM requesting results of her labwork.

## 2013-03-07 ENCOUNTER — Other Ambulatory Visit: Payer: Self-pay

## 2013-03-07 ENCOUNTER — Telehealth: Payer: Self-pay | Admitting: Internal Medicine

## 2013-03-11 ENCOUNTER — Telehealth: Payer: Self-pay | Admitting: Internal Medicine

## 2013-03-11 NOTE — Telephone Encounter (Signed)
s.w. pt and advised that 10.29.14 appt was in error per MD..per MD staff he wanted pt to come back in 2-30mths. done pt ok and aware

## 2013-03-12 ENCOUNTER — Ambulatory Visit: Payer: Medicare Other

## 2013-03-12 ENCOUNTER — Other Ambulatory Visit: Payer: Medicare Other | Admitting: Lab

## 2013-03-19 ENCOUNTER — Ambulatory Visit (AMBULATORY_SURGERY_CENTER): Payer: Medicare Other | Admitting: Internal Medicine

## 2013-03-19 ENCOUNTER — Encounter: Payer: Self-pay | Admitting: Internal Medicine

## 2013-03-19 VITALS — BP 152/74 | HR 56 | Temp 96.6°F | Resp 16 | Ht 61.0 in | Wt 185.0 lb

## 2013-03-19 DIAGNOSIS — R1310 Dysphagia, unspecified: Secondary | ICD-10-CM

## 2013-03-19 DIAGNOSIS — Z800 Family history of malignant neoplasm of digestive organs: Secondary | ICD-10-CM

## 2013-03-19 DIAGNOSIS — K222 Esophageal obstruction: Secondary | ICD-10-CM

## 2013-03-19 DIAGNOSIS — R1319 Other dysphagia: Secondary | ICD-10-CM

## 2013-03-19 DIAGNOSIS — D649 Anemia, unspecified: Secondary | ICD-10-CM

## 2013-03-19 DIAGNOSIS — D689 Coagulation defect, unspecified: Secondary | ICD-10-CM

## 2013-03-19 DIAGNOSIS — K219 Gastro-esophageal reflux disease without esophagitis: Secondary | ICD-10-CM

## 2013-03-19 MED ORDER — SODIUM CHLORIDE 0.9 % IV SOLN: 500.0000 mL | INTRAVENOUS | Status: DC

## 2013-03-19 NOTE — Progress Notes (Signed)
Lidocaine-40mg IV prior to Propofol InductionPropofol given over incremental dosages 

## 2013-03-19 NOTE — Progress Notes (Signed)
Called to room to assist during endoscopic procedure.  Patient ID and intended procedure confirmed with present staff. Received instructions for my participation in the procedure from the performing physician.  

## 2013-03-19 NOTE — Op Note (Signed)
[place]  [address]. [place], [id number]   ENDOSCOPY PROCEDURE REPORT  PATIENT: [name], [name]  MR#: [id number] BIRTHDATE: February 25, 1934 , 54  yrs. old GENDER: Female ENDOSCOPIST: Lafayette Dragon, MD REFERRED BY:  Burnis Medin, M.D. PROCEDURE DATE:  03/19/2013 PROCEDURE:  EGD, diagnostic and Savary dilation of esophagus ASA CLASS:     Class III INDICATIONS:  Dysphagia. MEDICATIONS: MAC sedation, administered by CRNA and propofol (Diprivan) 50mg  IV TOPICAL ANESTHETIC: Cetacaine Spray  DESCRIPTION OF PROCEDURE: After the risks benefits and alternatives of the procedure were thoroughly explained, informed consent was obtained.  The LB [id number] endoscope was introduced through the mouth and advanced to the second portion of the duodenum. Without limitations.  The instrument was slowly withdrawn as the mucosa was fully examined.      proximal and mid-esophageal mucosa was normal. There was a very mild esophageal stricture at the distal esophagus which allowed the endoscope to traverse it without resistance. There was no bleeding from the stricture. The stricture appeared benign. There was no significant hiatal hernia distal to the stricture Stomach: Gastric neoplasm was unremarkable with normal rugal folds rugal pattern. Gastric antrum and pyloric outlet. Retroflexion of the endoscope revealed normal fundus and cardia Duodenum: Duodenal bulb and descending duodenum was normal Guidewire was then placed into the antrum through the endoscope the endoscope was retracted and Savary dilators passed over the guidewire using 14 mm and 16 mm dilators. There was no blood on the dilator.[          The scope was then withdrawn from the patient and the procedure completed.  COMPLICATIONS: There were no complications. ENDOSCOPIC IMPRESSION: mild nonobstructing distal esophageal stricture. Status post passage of 14 and 16 mm dilators Dysphagia may be  related to combination of dysmotility as well as mild obstruction. If she is not improved with the dilatation I would suggest barium esophagram to assess the motility RECOMMENDATIONS: Anti-reflux regimen to be follow consider barium esophagram if no improvement after dilatation  REPEAT EXAM:no  eSigned:  Lafayette Dragon, MD 03/19/2013 3:17 PM   CC:  PATIENT NAME:  [name], [name] MR#: [id number]

## 2013-03-19 NOTE — Op Note (Signed)
[place]  [address]. [place], [id number]   COLONOSCOPY PROCEDURE REPORT  PATIENT: Hilery, Bussing  MR#: [id number] BIRTHDATE: 1934-04-15 , 11  yrs. old GENDER: Female ENDOSCOPIST: Lafayette Dragon, MD REFERRED [id number] Darnelle Going, M.D. PROCEDURE DATE:  03/19/2013 PROCEDURE:   Colonoscopy, screening First Screening Colonoscopy - Avg.  risk and is 50 yrs.  old or older - No.  Prior Negative Screening - Now for repeat screening. Other: See Comments  History of Adenoma - Now for follow-up colonoscopy & has been > or = to 3 yrs.  N/A  Polyps Removed Today? No.  Recommend repeat exam, <10 yrs? No. ASA CLASS:   Class III INDICATIONS:Patient's immediate family history of colon cancer and last colonoscopy 5 years ago elsewhere.  Sr.  with colon cancer. Anemia.  Hemoglobin 10.6.  Patient has been on Plavix until 5 days ago. MEDICATIONS: MAC sedation, administered by CRNA and propofol (Diprivan) 100mg  IV  DESCRIPTION OF PROCEDURE:   After the risks benefits and alternatives of the procedure were thoroughly explained, informed consent was obtained.  A digital rectal exam revealed no abnormalities of the rectum.   The LB [id number]  endoscope was introduced through the anus and advanced to the cecum, which was identified by both the appendix and ileocecal valve. No adverse events experienced.   The quality of the prep was good, using MoviPrep  The instrument was then slowly withdrawn as the colon was fully examined.      COLON FINDINGS: There was mild diverticulosis noted in the sigmoid colon with associated tortuosity and muscular hypertrophy. Retroflexed views revealed no abnormalities. The time to cecum=4 minutes 33 seconds.  Withdrawal time=6 minutes 00 seconds.  The scope was withdrawn and the procedure completed. COMPLICATIONS: There were no complications.  ENDOSCOPIC IMPRESSION: There was mild diverticulosis noted in the sigmoid colon nothing to  account for anemia  RECOMMENDATIONS: high fiber diet Follow stool Hemoccults If positive Hemoccults schedule small bowel capsule endoscopy May resume Plavix tomorrow   eSigned:  Lafayette Dragon, MD 03/19/2013 3:28 PM   cc:   PATIENT NAME:  Mariem, Crute MR#: [id number]

## 2013-03-19 NOTE — Patient Instructions (Signed)
YOU HAD AN ENDOSCOPIC PROCEDURE TODAY AT [facility]: Refer to the procedure report that was given to you for any specific questions about what was found during the examination.  If the procedure report does not answer your questions, please call your gastroenterologist to clarify.  If you requested that your care partner not be given the details of your procedure findings, then the procedure report has been included in a sealed envelope for you to review at your convenience later.  YOU SHOULD EXPECT: Some feelings of bloating in the abdomen. Passage of more gas than usual.  Walking can help get rid of the air that was put into your GI tract during the procedure and reduce the bloating. If you had a lower endoscopy (such as a colonoscopy or flexible sigmoidoscopy) you may notice spotting of blood in your stool or on the toilet paper. If you underwent a bowel prep for your procedure, then you may not have a normal bowel movement for a few days.  DIET:  Nothing to eat or drink until 4 pm. 4pm until 5 pm only clear liquids. 5 pm until morning only soft foods. Resume your regular diet in am.   ACTIVITY: Your care partner should take you home directly after the procedure.  You should plan to take it easy, moving slowly for the rest of the day.  You can resume normal activity the day after the procedure however you should NOT DRIVE or use heavy machinery for 24 hours (because of the sedation medicines used during the test).    SYMPTOMS TO REPORT IMMEDIATELY: A gastroenterologist can be reached at any hour.  During normal business hours, 8:30 AM to 5:00 PM Monday through Friday, call [phone].  After hours and on weekends, please call the GI answering service at [phone] who will take a message and have the physician on call contact you.   Following lower endoscopy (colonoscopy or flexible sigmoidoscopy):  Excessive amounts of blood in the stool  Significant tenderness or  worsening of abdominal pains  Swelling of the abdomen that is new, acute  Fever of 100F or higher  Following upper endoscopy (EGD)  Vomiting of blood or coffee ground material  New chest pain or pain under the shoulder blades  Painful or persistently difficult swallowing  New shortness of breath  Fever of 100F or higher  Black, tarry-looking stools  FOLLOW UP: If any biopsies were taken you will be contacted by phone or by letter within the next 1-3 weeks.  Call your gastroenterologist if you have not heard about the biopsies in 3 weeks.  Our staff will call the home number listed on your records the next business day following your procedure to check on you and address any questions or concerns that you may have at that time regarding the information given to you following your procedure. This is a courtesy call and so if there is no answer at the home number and we have not heard from you through the emergency physician on call, we will assume that you have returned to your regular daily activities without incident.  SIGNATURES/CONFIDENTIALITY: You and/or your care partner have signed paperwork which will be entered into your electronic medical record.  These signatures attest to the fact that that the information above on your After Visit Summary has been reviewed and is understood.  Full responsibility of the confidentiality of this discharge information lies with you and/or your care-partner.

## 2013-03-19 NOTE — Progress Notes (Signed)
Patient did not experience any of the following events: a burn prior to discharge; a fall within the facility; wrong site/side/patient/procedure/implant event; or a hospital transfer or hospital admission upon discharge from the facility. (G8907) Patient did not have preoperative order for IV antibiotic SSI prophylaxis. (G8918)  

## 2013-03-20 ENCOUNTER — Telehealth: Payer: Self-pay | Admitting: *Deleted

## 2013-03-20 NOTE — Telephone Encounter (Signed)
Left a message for patient to call me. (verifying she has stool cards per procedure from 03/19/13)

## 2013-03-20 NOTE — Telephone Encounter (Signed)
  Follow up Call-  Call back number 03/19/2013  Post procedure Call Back phone  # [phone]  Permission to leave phone message Yes     Patient questions:  Do you have a fever, pain , or abdominal swelling? no Pain Score  0 *  Have you tolerated food without any problems? yes  Have you been able to return to your normal activities? yes  Do you have any questions about your discharge instructions: Diet   no Medications  no Follow up visit  no  Do you have questions or concerns about your Care? no  Actions: * If pain score is 4 or above: No action needed, pain <4.

## 2013-03-21 ENCOUNTER — Other Ambulatory Visit: Payer: Self-pay | Admitting: *Deleted

## 2013-03-21 DIAGNOSIS — D649 Anemia, unspecified: Secondary | ICD-10-CM

## 2013-03-21 NOTE — Telephone Encounter (Signed)
Spoke with patient and she did not get stool cards. Mailed cards out for patient to complete and return to [place]. Lab in EPIC.

## 2013-03-28 ENCOUNTER — Other Ambulatory Visit: Payer: Self-pay | Admitting: Family Medicine

## 2013-03-31 ENCOUNTER — Other Ambulatory Visit: Payer: Self-pay | Admitting: Family Medicine

## 2013-03-31 MED ORDER — ALBUTEROL SULFATE HFA 108 (90 BASE) MCG/ACT IN AERS: 2.0000 | INHALATION_SPRAY | Freq: Four times a day (QID) | RESPIRATORY_TRACT | Status: DC | PRN

## 2013-04-14 ENCOUNTER — Other Ambulatory Visit: Payer: Self-pay | Admitting: Internal Medicine

## 2013-04-15 ENCOUNTER — Telehealth: Payer: Self-pay | Admitting: Family Medicine

## 2013-04-15 NOTE — Telephone Encounter (Signed)
Last seen and filled on 01/20/13 #60  Has a future appt on 05/13/13 Please advise.  Thanks!

## 2013-04-16 ENCOUNTER — Other Ambulatory Visit: Payer: Self-pay | Admitting: Family Medicine

## 2013-04-16 NOTE — Telephone Encounter (Signed)
It appears the sig was changed to tid on this medication   I dont think i ever wrote this rx for refill   . See my last note September   Please find out how this happened Was this refilled by another provider? ? Changed by a pharmacy?   Can rx 30 pills until seen. To use ocassionally   ALPRAZolam (XANAX) 0.25 MG tablet  Take half to one whole tablet bid prn  30 tablet  0 refills

## 2013-04-18 ENCOUNTER — Encounter: Payer: Self-pay | Admitting: *Deleted

## 2013-04-18 ENCOUNTER — Other Ambulatory Visit: Payer: Self-pay | Admitting: Family Medicine

## 2013-04-18 ENCOUNTER — Other Ambulatory Visit: Payer: Self-pay | Admitting: Internal Medicine

## 2013-04-18 MED ORDER — ALPRAZOLAM 0.25 MG PO TABS: ORAL_TABLET | ORAL | Status: DC

## 2013-04-18 NOTE — Telephone Encounter (Signed)
Spoke to the pt.  She did not know why the sig had been changed in the system.  She read me the directions on her bottle and they were what was originally prescribed by Dr. Regis Bill.  Called in rx to the pharmacy and deleted wrong medication off her list.

## 2013-04-19 NOTE — Telephone Encounter (Signed)
Ok to refill x 3 

## 2013-04-21 ENCOUNTER — Ambulatory Visit (INDEPENDENT_AMBULATORY_CARE_PROVIDER_SITE_OTHER): Payer: Medicare Other | Admitting: Internal Medicine

## 2013-04-21 ENCOUNTER — Encounter: Payer: Self-pay | Admitting: *Deleted

## 2013-04-21 ENCOUNTER — Encounter: Payer: Self-pay | Admitting: Internal Medicine

## 2013-04-21 VITALS — BP 160/60 | HR 75 | Temp 98.6°F | Wt 188.0 lb

## 2013-04-21 DIAGNOSIS — R252 Cramp and spasm: Secondary | ICD-10-CM

## 2013-04-21 DIAGNOSIS — Z634 Disappearance and death of family member: Secondary | ICD-10-CM

## 2013-04-21 DIAGNOSIS — F4321 Adjustment disorder with depressed mood: Secondary | ICD-10-CM

## 2013-04-21 DIAGNOSIS — I10 Essential (primary) hypertension: Secondary | ICD-10-CM

## 2013-04-21 DIAGNOSIS — N289 Disorder of kidney and ureter, unspecified: Secondary | ICD-10-CM

## 2013-04-21 DIAGNOSIS — F411 Generalized anxiety disorder: Secondary | ICD-10-CM

## 2013-04-21 DIAGNOSIS — Z733 Stress, not elsewhere classified: Secondary | ICD-10-CM

## 2013-04-21 HISTORY — DX: Disappearance and death of family member: Z63.4

## 2013-04-21 NOTE — Patient Instructions (Addendum)
BP is  Up today but could be from missing the metoprolol . Get back on this medication and  have BP readings  At least 3 x per week sent to [place] so we can decide if we need to adjust medication any more.  ( such as 40 10 25)   If ok then we can   FU visit with labs in  4 months or as needed . Lungs are clear  Today .     Lab Results  Component Value Date   WBC 4.7 03/03/2013   HGB 11.5* 03/03/2013   HCT 35.5 03/03/2013   PLT 238 03/03/2013   GLUCOSE 126 03/03/2013   CHOL 171 09/09/2012   TRIG 148 09/09/2012   HDL 47 09/09/2012   LDLCALC 94 09/09/2012   ALT 9 03/03/2013   AST 24 03/03/2013   NA 144 03/03/2013   K 4.0 03/03/2013   CL 106 01/21/2013   CREATININE 1.3* 03/03/2013   BUN 22.6 03/03/2013   CO2 28 03/03/2013   TSH 1.786 09/09/2012   INR 0.94 12/22/2009

## 2013-04-21 NOTE — Progress Notes (Addendum)
Chief Complaint  Patient presents with  . Follow-up    Complains of having cramps.    HPI: Patient comes in today for follow up of  multiple medical problems.   Since her last visit her brother has developed a GI cancer stomach versus pancreas and rest the hospital over the past week.  Metoprolol ; Out for 3 days . Blood pressure had been somewhat better.  Left hand still botherin her  Like burning . Has seen Dr. Maxie Better about this. Had surgery  anniv of her daughter's death last year H. 08-23-2055 esophageal  Cancer  And then had MI    [facility] strong history of cancer in her family.  Using Xanax only as needed when she gets anxiety. Is aware does not take it regularly. ROS: See pertinent positives and negatives per HPI. Still gets some leg cramps taking potassium. She has used a nebulizer recently and it has helped her lungs and her breathing. Has a followup visit with the cancer center in regarding to her anemia. No active bleeding fever falling.  Past Medical History  Diagnosis Date  . Asthma     " onset when young"   . History of DVT (deep vein thrombosis)     "RLE" (09/09/2012)  . Allergy   . GERD (gastroesophageal reflux disease)   . Hyperlipidemia   . Hypertension   . Varicose veins   . Lupus     "cured years ago" (09/09/2012)  . Headache(784.0)     "related to my high blood pressure" (09/09/2012)  . Arthritis     "shoulders" (09/09/2012)    Family History  Problem Relation Age of Onset  . Hypertension Mother   . Hypertension Father   . Cancer Sister     Colon and Breast Cancer  . Hypertension Brother   . Heart attack Daughter   . Lung cancer      both parents     History   Social History  . Marital Status: Single    Spouse Name: N/A    Number of Children: N/A  . Years of Education: N/A   Social History Main Topics  . Smoking status: Never Smoker   . Smokeless tobacco: Never Used  . Alcohol Use: No  . Drug Use: No  . Sexual Activity: No   Other Topics Concern   . None   Social History Narrative   2 people living in the home.  Grand daughter.   Up and down through the night      Had 7 children  2 deceased .   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    works 3 days per week currently .   Glasses dentures  Neg tad     Outpatient Encounter Prescriptions as of 04/21/2013  Medication Sig  . albuterol (PROVENTIL HFA;VENTOLIN HFA) 108 (90 BASE) MCG/ACT inhaler Inhale 2 puffs into the lungs every 6 (six) hours as needed for wheezing. For wheezing  . albuterol (PROVENTIL) (2.5 MG/3ML) 0.083% nebulizer solution Take 2.5 mg by nebulization every 6 (six) hours as needed. For wheezing  . ALPRAZolam (XANAX) 0.25 MG tablet Take half to one whole tablet bid prn  . clopidogrel (PLAVIX) 75 MG tablet Take 75 mg by mouth daily.   . famotidine (PEPCID) 40 MG tablet Take 40 mg by mouth. In the evening  . fenofibrate 160 MG tablet Take 1 tablet (160 mg total) by mouth daily.  . Fluticasone-Salmeterol (ADVAIR DISKUS)  250-50 MCG/DOSE AEPB Inhale 1 puff into the lungs 2 (two) times daily.  . metoprolol (LOPRESSOR) 50 MG tablet TAKE 1 TABLET ONCE DAILY.  . montelukast (SINGULAIR) 10 MG tablet Take 1 tablet (10 mg total) by mouth every morning.  . Olmesartan-Amlodipine-HCTZ (TRIBENZOR) 40-5-25 MG TABS Take 1 capsule by mouth daily.  . potassium chloride (K-DUR) 10 MEQ tablet   . potassium chloride (K-DUR,KLOR-CON) 10 MEQ tablet Take 2 tablets (20 mEq total) by mouth daily.  . vitamin B-12 (CYANOCOBALAMIN) 1000 MCG tablet Take 1,000 mcg by mouth daily.    EXAM:  BP 160/60  Pulse 75  Temp(Src) 98.6 F (37 C) (Oral)  Wt 188 lb (85.276 kg)  SpO2 99%  Body mass index is 35.54 kg/(m^2).  GENERAL: vitals reviewed and listed above, alert, oriented, appears well hydrated and in no acute distress normal speech and eye contact contact us sad at times regarding her daughter's death and her brother's illness of late. HEENT: atraumatic,  conjunctiva  clear, no obvious abnormalities on inspection of external nose and ears  NECK: no obvious masses on inspection palpation no adenopathy LUNGS: clear to auscultation bilaterally, no wheezes, rales or rhonchi, good air movement CV: HRRR, no clubbing cyanosis or  peripheral edema nl cap refill  Left hand good range of motion seemingly normal grip pulses are normal normal capillary refill   PSYCH: pleasant and cooperative,  mildly down and sad but normal speech and cognitively intact grossly. Lab Results  Component Value Date   WBC 4.7 03/03/2013   HGB 11.5* 03/03/2013   HCT 35.5 03/03/2013   PLT 238 03/03/2013   GLUCOSE 126 03/03/2013   CHOL 171 09/09/2012   TRIG 148 09/09/2012   HDL 47 09/09/2012   LDLCALC 94 09/09/2012   ALT 9 03/03/2013   AST 24 03/03/2013   NA 144 03/03/2013   K 4.0 03/03/2013   CL 106 01/21/2013   CREATININE 1.3* 03/03/2013   BUN 22.6 03/03/2013   CO2 28 03/03/2013   TSH 1.786 09/09/2012   INR 0.94 12/22/2009    ASSESSMENT AND PLAN:  Discussed the following assessment and plan:  HTN (hypertension) - Blood pressure is back up today possibly because she is out of metoprolol we'll restart K. readings 3 times a week send them in to make sure this is controlled   Renal insufficiency - Last labs seems stable  Leg cramps  Anxiety state, unspecified - Discussed risks benefits and limitations of medication  Bereavement due to life event - Anniversary daughter's death, brother new diagnosis of cancer Labs better BP is not.   Discussed possibility of any depression medications if she feels that she is stuck in her bereavement angry. However she appears to be functioning rather well. He to keep closer tabs on her blood pressure believe it is elevated that she is out of her medicine for few days. Will be due for laboratory tests when he returns. Would still have her followup with hematology but she can change the appointment time because of her family  condition. She is to continue on the potassium. -Patient advised to return or notify health care team  if symptoms worsen or persist or new concerns arise.  Patient Instructions   BP is  Up today but could be from missing the metoprolol . Get back on this medication and  have BP readings  At least 3 x per week sent to [place] so we can decide if we need to adjust medication any more.  ( such  as 40 10 25)   If ok then we can   FU visit with labs in  4 months or as needed . Lungs are clear  Today .     Lab Results  Component Value Date   WBC 4.7 03/03/2013   HGB 11.5* 03/03/2013   HCT 35.5 03/03/2013   PLT 238 03/03/2013   GLUCOSE 126 03/03/2013   CHOL 171 09/09/2012   TRIG 148 09/09/2012   HDL 47 09/09/2012   LDLCALC 94 09/09/2012   ALT 9 03/03/2013   AST 24 03/03/2013   NA 144 03/03/2013   K 4.0 03/03/2013   CL 106 01/21/2013   CREATININE 1.3* 03/03/2013   BUN 22.6 03/03/2013   CO2 28 03/03/2013   TSH 1.786 09/09/2012   INR 0.94 12/22/2009        Krystyna Cleckley K. Samiyah Stupka M.D.  Total visit 76mins > 50% spent counseling and coordinating care    Pre visit review using our clinic review tool, if applicable. No additional management support is needed unless otherwise documented below in the visit note.

## 2013-04-22 ENCOUNTER — Other Ambulatory Visit: Payer: Self-pay | Admitting: Internal Medicine

## 2013-04-23 ENCOUNTER — Telehealth: Payer: Self-pay | Admitting: Internal Medicine

## 2013-04-23 NOTE — Telephone Encounter (Signed)
Spoke with patient and mailed out new hemoccult cards.

## 2013-05-01 ENCOUNTER — Telehealth: Payer: Self-pay | Admitting: Internal Medicine

## 2013-05-01 NOTE — Telephone Encounter (Signed)
returned pt call and adn s.w. pt to r/s 12.19.14 appt per pt request...done

## 2013-05-02 ENCOUNTER — Other Ambulatory Visit: Payer: Medicare Other | Admitting: Lab

## 2013-05-02 ENCOUNTER — Ambulatory Visit: Payer: Medicare Other

## 2013-05-02 ENCOUNTER — Other Ambulatory Visit: Payer: Medicare Other

## 2013-05-13 ENCOUNTER — Ambulatory Visit: Payer: Medicare Other | Admitting: Internal Medicine

## 2013-05-19 ENCOUNTER — Telehealth: Payer: Self-pay | Admitting: *Deleted

## 2013-05-19 NOTE — Telephone Encounter (Signed)
Message copied by Hulan Saas on Mon May 19, 2013 11:22 AM ------      Message from: Hulan Saas      Created: Mon Apr 21, 2013  9:52 AM       Did patient send back hemoccult cards. (letter mailed) DB ------

## 2013-05-19 NOTE — Telephone Encounter (Signed)
Mailed patient a letter requesting she send in her hemoccult cards and spoke with patient about sending cards back. On 04/23/13, mailed out a new set of hemoccult cards to patient. She has not returned her cards.

## 2013-05-26 ENCOUNTER — Other Ambulatory Visit: Payer: Self-pay | Admitting: Internal Medicine

## 2013-05-28 ENCOUNTER — Telehealth: Payer: Self-pay | Admitting: Family Medicine

## 2013-05-28 NOTE — Telephone Encounter (Signed)
You scheduled this patient for April.  She should have also been scheduled for lab work before this visit.  Please call the patient and make that appointment.  Thanks!

## 2013-05-30 ENCOUNTER — Ambulatory Visit (HOSPITAL_BASED_OUTPATIENT_CLINIC_OR_DEPARTMENT_OTHER): Payer: Medicare Other | Admitting: Internal Medicine

## 2013-05-30 ENCOUNTER — Telehealth: Payer: Self-pay | Admitting: Internal Medicine

## 2013-05-30 ENCOUNTER — Other Ambulatory Visit (HOSPITAL_BASED_OUTPATIENT_CLINIC_OR_DEPARTMENT_OTHER): Payer: Medicare Other

## 2013-05-30 VITALS — BP 163/49 | HR 64 | Temp 98.5°F | Resp 18 | Ht 61.0 in | Wt 191.4 lb

## 2013-05-30 DIAGNOSIS — D649 Anemia, unspecified: Secondary | ICD-10-CM

## 2013-05-30 DIAGNOSIS — N189 Chronic kidney disease, unspecified: Secondary | ICD-10-CM

## 2013-05-30 DIAGNOSIS — I129 Hypertensive chronic kidney disease with stage 1 through stage 4 chronic kidney disease, or unspecified chronic kidney disease: Secondary | ICD-10-CM

## 2013-05-30 DIAGNOSIS — Z86718 Personal history of other venous thrombosis and embolism: Secondary | ICD-10-CM

## 2013-05-30 DIAGNOSIS — N289 Disorder of kidney and ureter, unspecified: Secondary | ICD-10-CM

## 2013-05-30 DIAGNOSIS — K219 Gastro-esophageal reflux disease without esophagitis: Secondary | ICD-10-CM

## 2013-05-30 LAB — COMPREHENSIVE METABOLIC PANEL (CC13)
ALBUMIN: 3.6 g/dL (ref 3.5–5.0)
ALT: 12 U/L (ref 0–55)
AST: 25 U/L (ref 5–34)
Alkaline Phosphatase: 41 U/L (ref 40–150)
Anion Gap: 10 mEq/L (ref 3–11)
BILIRUBIN TOTAL: 0.39 mg/dL (ref 0.20–1.20)
BUN: 21.4 mg/dL (ref 7.0–26.0)
CO2: 27 meq/L (ref 22–29)
Calcium: 9.9 mg/dL (ref 8.4–10.4)
Chloride: 105 mEq/L (ref 98–109)
Creatinine: 1.3 mg/dL — ABNORMAL HIGH (ref 0.6–1.1)
GLUCOSE: 132 mg/dL (ref 70–140)
POTASSIUM: 3.8 meq/L (ref 3.5–5.1)
SODIUM: 143 meq/L (ref 136–145)
TOTAL PROTEIN: 6.7 g/dL (ref 6.4–8.3)

## 2013-05-30 LAB — CBC WITH DIFFERENTIAL/PLATELET
BASO%: 1.2 % (ref 0.0–2.0)
Basophils Absolute: 0.1 10*3/uL (ref 0.0–0.1)
EOS ABS: 0.2 10*3/uL (ref 0.0–0.5)
EOS%: 3.3 % (ref 0.0–7.0)
HCT: 33.3 % — ABNORMAL LOW (ref 34.8–46.6)
HEMOGLOBIN: 10.8 g/dL — AB (ref 11.6–15.9)
LYMPH%: 38.1 % (ref 14.0–49.7)
MCH: 27.8 pg (ref 25.1–34.0)
MCHC: 32.5 g/dL (ref 31.5–36.0)
MCV: 85.6 fL (ref 79.5–101.0)
MONO#: 0.4 10*3/uL (ref 0.1–0.9)
MONO%: 8.3 % (ref 0.0–14.0)
NEUT%: 49.1 % (ref 38.4–76.8)
NEUTROS ABS: 2.4 10*3/uL (ref 1.5–6.5)
PLATELETS: 220 10*3/uL (ref 145–400)
RBC: 3.89 10*6/uL (ref 3.70–5.45)
RDW: 13.5 % (ref 11.2–14.5)
WBC: 4.8 10*3/uL (ref 3.9–10.3)
lymph#: 1.8 10*3/uL (ref 0.9–3.3)

## 2013-05-30 LAB — IRON AND TIBC CHCC
%SAT: 22 % (ref 21–57)
IRON: 77 ug/dL (ref 41–142)
TIBC: 355 ug/dL (ref 236–444)
UIBC: 278 ug/dL (ref 120–384)

## 2013-05-30 LAB — FERRITIN CHCC: Ferritin: 73 ng/ml (ref 9–269)

## 2013-05-30 MED ORDER — FERROUS SULFATE 325 (65 FE) MG PO TBEC: 325.0000 mg | DELAYED_RELEASE_TABLET | Freq: Every day | ORAL | Status: DC

## 2013-05-30 NOTE — Patient Instructions (Signed)
Iron tablets, capsules, extended-release tablets What is this medicine? IRON (AHY ern) replaces iron that is essential to healthy red blood cells. Iron is used to treat iron deficiency anemia. Anemia may cause problems like tiredness, shortness of breath, or slowed growth in children. Only take iron if your doctor has told you to. Do not treat yourself with iron if you are feeling tired. Most healthy people get enough iron in their diets, particularly if they eat cereals, meat, poultry, and fish. This medicine may be used for other purposes; ask your health care provider or pharmacist if you have questions. COMMON BRAND NAME(S): Bifera, Duofer, Feosol Complete, [employer], [place], [place], [place] , [place], Fero-Grad 500, Ferretts, Ferrimin , Ferro-Sequels, Hemocyte, Nephro-Fer, NovaFerrum, ProFe, Slow Fe, Slow Iron , Tandem What should I tell my health care provider before I take this medicine? They need to know if you have any of these conditions: -frequently drink alcohol -bowel disease -hemolytic anemia -iron overload (hemochromatosis, hemosiderosis) -liver disease -problems with swallowing -stomach ulcer or other stomach problems -an unusual or allergic reaction to iron, other medicines, foods, dyes, or preservatives -pregnant or trying to get pregnant -breast-feeding How should I use this medicine? Take this medicine by mouth with a glass of water or fruit juice. Follow the directions on the prescription label. Swallow whole. Do not crush or chew. Take this medicine in an upright or sitting position. Try to take any bedtime doses at least 10 minutes before lying down. You may take this medicine with food. Take your medicine at regular intervals. Do not take your medicine more often than directed. Do not stop taking except on your doctor's advice. Talk to your pediatrician regarding the use of this medicine in children. While this drug may be prescribed for selected conditions,  precautions do apply. Overdosage: If you think you have taken too much of this medicine contact a poison control center or emergency room at once. NOTE: This medicine is only for you. Do not share this medicine with others. What if I miss a dose? If you miss a dose, take it as soon as you can. If it is almost time for your next dose, take only that dose. Do not take double or extra doses. What may interact with this medicine? If you are taking this iron product, you should not take iron in any other medicine or dietary supplement. This medicine may also interact with the following medications: -alendronate -antacids -cefdinir -chloramphenicol -cholestyramine -deferoxamine -dimercaprol -etidronate -medicines for stomach ulcers or other stomach problems -pancreatic enzymes -quinolone antibiotics (examples: Cipro, Floxin, Levaquin, Tequin and others) -risedronate -tetracycline antibiotics (examples: doxycycline, tetracycline, minocycline, and others) -thyroid hormones This list may not describe all possible interactions. Give your health care provider a list of all the medicines, herbs, non-prescription drugs, or dietary supplements you use. Also tell them if you smoke, drink alcohol, or use illegal drugs. Some items may interact with your medicine. What should I watch for while using this medicine? Use iron supplements only as directed by your health care professional. Dennis Bast will need important blood work while you are taking this medicine. It may take 3 to 6 months of therapy to treat low iron levels. Pregnant women should follow the dose and length of iron treatment as directed by their doctors. Do not use iron longer than prescribed, and do not take a higher dose than recommended. Long-term use may cause excess iron to build-up in the body. Do not take iron with antacids. If you need  to take an antacid, take it 2 hours after a dose of iron. What side effects may I notice from receiving this  medicine? Side effects that you should report to your doctor or health care professional as soon as possible: -allergic reactions like skin rash, itching or hives, swelling of the face, lips, or tongue -blue lips, nails, or palms -dark colored stools (this may be due to the iron, but can indicate a more serious condition) -drowsiness -pain with or difficulty swallowing -pale or clammy skin -seizures -stomach pain -unusually weak or tired -vomiting -weak, fast, or irregular heartbeat Side effects that usually do not require medical attention (report to your doctor or health care professional if they continue or are bothersome): -constipation -indigestion -nausea or stomach upset This list may not describe all possible side effects. Call your doctor for medical advice about side effects. You may report side effects to FDA at 1-800-FDA-1088. Where should I keep my medicine? Keep out of the reach of children. Even small amounts of iron can be harmful to a child. Store at room temperature between 15 and 30 degrees C (59 and 86 degrees F). Keep container tightly closed. Throw away any unused medicine after the expiration date. NOTE: This sheet is a summary. It may not cover all possible information. If you have questions about this medicine, talk to your doctor, pharmacist, or health care provider.  2014, Elsevier/Gold Standard. (2007-09-17 17:03:41)

## 2013-05-30 NOTE — Progress Notes (Deleted)
[place], MD [place] [address]  DIAGNOSIS: Anemia  GERD (gastroesophageal reflux disease)  Renal insufficiency creatinine 1.3  No chief complaint on file.   CURRENT THERAPY:  INTERVAL HISTORY: [name] 80 y.o. female ***   MEDICAL HISTORY: Past Medical History  Diagnosis Date  . Asthma     " onset when young"   . History of DVT (deep vein thrombosis)     "RLE" (09/09/2012)  . Allergy   . GERD (gastroesophageal reflux disease)   . Hyperlipidemia   . Hypertension   . Varicose veins   . Lupus     "cured years ago" (09/09/2012)  . Headache(784.0)     "related to my high blood pressure" (09/09/2012)  . Arthritis     "shoulders" (09/09/2012)    INTERIM HISTORY: has Varicose veins of leg with complications; Chest pain, atypical; GERD (gastroesophageal reflux disease); Depression; HTN (hypertension); Hyperlipidemia; Asthma; Family hx of colon cancer; Family hx of lung cancer; Family hx-breast malignancy; Renal insufficiency creatinine 1.3; Anemia; Back pain; Headache(784.0); Anxiety state, unspecified; Leg cramps; Other dysphagia; Colon cancer screening; and Bereavement due to life event on her problem list.    ALLERGIES:  is allergic to aspirin and penicillins.  MEDICATIONS: has a current medication list which includes the following prescription(s): albuterol, albuterol, alprazolam, clopidogrel, famotidine, fenofibrate, fluticasone-salmeterol, metoprolol, montelukast, potassium chloride, potassium chloride, tribenzor, and vitamin b-12.  SURGICAL HISTORY:  Past Surgical History  Procedure Laterality Date  . Abdominal hysterectomy      partial  . Breast surgery    . Carpal tunnel release Right 2000's  . Shoulder open rotator cuff repair Bilateral 2000's    REVIEW OF SYSTEMS:   Constitutional: Denies fevers, chills or abnormal weight loss Eyes: Denies blurriness of vision Ears,  nose, mouth, throat, and face: Denies mucositis or sore throat Respiratory: Denies cough, dyspnea or wheezes Cardiovascular: Denies palpitation, chest discomfort or lower extremity swelling Gastrointestinal:  Denies nausea, heartburn or change in bowel habits Skin: Denies abnormal skin rashes Lymphatics: Denies new lymphadenopathy or easy bruising Neurological:Denies numbness, tingling or new weaknesses Behavioral/Psych: Mood is stable, no new changes  All other systems were reviewed with the patient and are negative.  PHYSICAL EXAMINATION: ECOG PERFORMANCE STATUS: {CHL ONC ECOG [id number]}  There were no vitals taken for this visit.  GENERAL:alert, no distress and comfortable SKIN: skin color, texture, turgor are normal, no rashes or significant lesions EYES: normal, Conjunctiva are pink and non-injected, sclera clear OROPHARYNX:no exudate, no erythema and lips, buccal mucosa, and tongue normal  NECK: supple, thyroid normal size, non-tender, without nodularity LYMPH:  no palpable lymphadenopathy in the cervical, axillary or supraclavicular LUNGS: clear to auscultation and percussion with normal breathing effort HEART: regular rate & rhythm and no murmurs and no lower extremity edema ABDOMEN:abdomen soft, non-tender and normal bowel sounds Musculoskeletal:no cyanosis of digits and no clubbing  NEURO: alert & oriented x 3 with fluent speech, no focal motor/sensory deficits   LABORATORY DATA: Results for orders placed in visit on 05/30/13 (from the past 48 hour(s))  CBC WITH DIFFERENTIAL     Status: Abnormal   Collection Time    05/30/13 10:23 AM      Result Value Range   WBC 4.8  3.9 - 10.3 10e3/uL   NEUT# 2.4  1.5 - 6.5 10e3/uL   HGB 10.8 (*) 11.6 - 15.9 g/dL   HCT 33.3 (*) 34.8 - 46.6 %   Platelets  220  145 - 400 10e3/uL   MCV 85.6  79.5 - 101.0 fL   MCH 27.8  25.1 - 34.0 pg   MCHC 32.5  31.5 - 36.0 g/dL   RBC 3.89  3.70 - 5.45 10e6/uL   RDW 13.5  11.2 - 14.5 %    lymph# 1.8  0.9 - 3.3 10e3/uL   MONO# 0.4  0.1 - 0.9 10e3/uL   Eosinophils Absolute 0.2  0.0 - 0.5 10e3/uL   Basophils Absolute 0.1  0.0 - 0.1 10e3/uL   NEUT% 49.1  38.4 - 76.8 %   LYMPH% 38.1  14.0 - 49.7 %   MONO% 8.3  0.0 - 14.0 %   EOS% 3.3  0.0 - 7.0 %   BASO% 1.2  0.0 - 2.0 %       Labs:  Lab Results  Component Value Date   WBC 4.8 05/30/2013   HGB 10.8* 05/30/2013   HCT 33.3* 05/30/2013   MCV 85.6 05/30/2013   PLT 220 05/30/2013   NEUTROABS 2.4 05/30/2013      Chemistry      Component Value Date/Time   NA 144 03/03/2013 1449   NA 141 01/21/2013 1258   K 4.0 03/03/2013 1449   K 3.7 01/21/2013 1258   CL 106 01/21/2013 1258   CO2 28 03/03/2013 1449   CO2 29 01/21/2013 1258   BUN 22.6 03/03/2013 1449   BUN 22 01/21/2013 1258   CREATININE 1.3* 03/03/2013 1449   CREATININE 1.2 01/21/2013 1258      Component Value Date/Time   CALCIUM 10.4 03/03/2013 1449   CALCIUM 9.5 01/21/2013 1258   ALKPHOS 36* 03/03/2013 1449   AST 24 03/03/2013 1449   ALT 9 03/03/2013 1449   BILITOT 0.47 03/03/2013 1449       Urine Studies No results found for this basename: UACOL, UAPR, USPG, UPH, UTP, UGL, UKET, UBIL, UHGB, UNIT, UROB, ULEU, UEPI, UWBC, URBC, UBAC, CAST, CRYS, UCOM, BILUA,  in the last 72 hours  Basic Metabolic Panel: No results found for this basename: NA, K, CL, CO2, GLUCOSE, BUN, CREATININE, CALCIUM, MG, PHOS,  in the last 168 hours GFR The CrCl is unknown because both a height and weight (above a minimum accepted value) are required for this calculation. Liver Function Tests: No results found for this basename: AST, ALT, ALKPHOS, BILITOT, PROT, ALBUMIN,  in the last 168 hours No results found for this basename: LIPASE, AMYLASE,  in the last 168 hours No results found for this basename: AMMONIA,  in the last 168 hours Coagulation profile No results found for this basename: INR, PROTIME,  in the last 168 hours  CBC:  Recent Labs Lab 05/30/13 1023  WBC 4.8  NEUTROABS 2.4   HGB 10.8*  HCT 33.3*  MCV 85.6  PLT 220   Cardiac Enzymes: No results found for this basename: CKTOTAL, CKMB, CKMBINDEX, TROPONINI,  in the last 168 hours BNP: No components found with this basename: POCBNP,  CBG: No results found for this basename: GLUCAP,  in the last 168 hours D-Dimer No results found for this basename: DDIMER,  in the last 72 hours Hgb A1c No results found for this basename: HGBA1C,  in the last 72 hours Lipid Profile No results found for this basename: CHOL, HDL, LDLCALC, TRIG, CHOLHDL, LDLDIRECT,  in the last 72 hours Thyroid function studies No results found for this basename: TSH, T4TOTAL, FREET3, T3FREE, THYROIDAB,  in the last 72 hours Anemia work up No results found for  this basename: VITAMINB12, FOLATE, FERRITIN, TIBC, IRON, RETICCTPCT,  in the last 72 hours Microbiology No results found for this or any previous visit (from the past 240 hour(s)).  Studies:  No results found.   RADIOGRAPHIC STUDIES: No results found.  ASSESSMENT: [name] 80 y.o. female with a history of Anemia  GERD (gastroesophageal reflux disease)  Renal insufficiency creatinine 1.3   PLAN:    All questions were answered. The patient knows to call the clinic with any problems, questions or concerns. We can certainly see the patient much sooner if necessary.  I spent {CHL ONC TIME VISIT - [id number] counseling the patient face to face. The total time spent in the appointment was {CHL ONC TIME VISIT - [id number].    Ardelia Wrede, MD 05/30/2013 10:50 AM

## 2013-05-30 NOTE — Telephone Encounter (Signed)
Talked to pt and gav her labs for February , april and July 2015 with MD visit

## 2013-06-01 NOTE — Progress Notes (Signed)
[place] OFFICE PROGRESS NOTE  Angela Dawson, MD [place] [address]  DIAGNOSIS: Anemia - Plan: ferrous sulfate 325 (65 FE) MG EC tablet, CBC with Differential, Ferritin, Iron and TIBC, CBC with Differential, Comprehensive metabolic panel (Cmet) - [facility], Ferritin, Iron and TIBC  GERD (gastroesophageal reflux disease)  Renal insufficiency creatinine 1.3  Chief Complaint  Patient presents with  . Anemia    CURRENT THERAPY: Ferrous sulfate 325 mg daily with orange juice.   INTERVAL HISTORY: [name] 80 y.o. female who is has a history of gastric esophageal reflux disease and anemia presents for further evaluation and management.  A primary care doctor (Dr. Regis Bill) referred the patient for anemia. On 11/25/2012 her hemoglobin was 11.2, on 01/21/2013 it was 10.6. She presents for follow-up with her daughter [name].   She was last seen by nurse practitioner Willia Craze due to dysphagia and scheduled for a EGD and colonoscopy on 03/19/2013 by Dr. Olevia Perches. She had the colonoscopy revealing mild diverticulosis and EGD revealing a mild distal esophageal stricture.    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 She states that sometimes that'll sort of tired. She continues eat ice but does not eat ice as much as she did in the past. She lives with her granddaughter and Sims. She complains of intermittent shoulder pain but otherwise she reports doing pretty well. Of note she's had left shoulder pain which has been evaluated in the past.   MEDICAL HISTORY: Past Medical History  Diagnosis Date  . Asthma     " onset when young"   . History of DVT (deep vein thrombosis)     "RLE" (09/09/2012)  . Allergy   . GERD (gastroesophageal reflux disease)   . Hyperlipidemia   . Hypertension   . Varicose veins   . Lupus     "cured years ago" (09/09/2012)  . Headache(784.0)     "related to my high blood  pressure" (09/09/2012)  . Arthritis     "shoulders" (09/09/2012)    INTERIM HISTORY: has Varicose veins of leg with complications; Chest pain, atypical; GERD (gastroesophageal reflux disease); Depression; HTN (hypertension); Hyperlipidemia; Asthma; Family hx of colon cancer; Family hx of lung cancer; Family hx-breast malignancy; Renal insufficiency creatinine 1.3; Anemia; Back pain; Headache(784.0); Anxiety state, unspecified; Leg cramps; Other dysphagia; Colon cancer screening; and Bereavement due to life event on her problem list.    ALLERGIES:  is allergic to aspirin and penicillins.  MEDICATIONS: has a current medication list which includes the following prescription(s): albuterol, albuterol, alprazolam, clopidogrel, famotidine, fenofibrate, fluticasone-salmeterol, metoprolol, montelukast, potassium chloride, potassium chloride, tribenzor, vitamin b-12, and ferrous sulfate.  SURGICAL HISTORY:  Past Surgical History  Procedure Laterality Date  . Abdominal hysterectomy      partial  . Breast surgery    . Carpal tunnel release Right 2000's  . Shoulder open rotator cuff repair Bilateral 2000's    REVIEW OF SYSTEMS:   Constitutional: Denies fevers, chills or abnormal weight loss Eyes: Denies blurriness of vision Ears, nose, mouth, throat, and face: Denies mucositis or sore throat Respiratory: Denies cough, dyspnea or wheezes Cardiovascular: Denies palpitation, chest discomfort or lower extremity swelling Gastrointestinal:  Denies nausea, heartburn or change in bowel habits Skin: Denies abnormal skin rashes Lymphatics: Denies new lymphadenopathy or easy bruising Neurological:Denies numbness, tingling or new weaknesses Behavioral/Psych: Mood is stable, no new changes  All other systems were reviewed with the patient and are negative.  PHYSICAL EXAMINATION: ECOG PERFORMANCE STATUS: 1 - Symptomatic but completely ambulatory  Blood pressure 163/49, pulse 64, temperature 98.5 F (36.9  C), temperature source Oral, resp. rate 18, height 5' 1"  (1.549 m), weight 191 lb 6.4 oz (86.818 kg).  GENERAL:alert, no distress and comfortable; elderly female who appears her stated age SKIN: skin color, texture, turgor are normal, no rashes or significant lesions EYES: normal, Conjunctiva are pink and non-injected, sclera clear OROPHARYNX:no exudate, no erythema and lips, buccal mucosa, and tongue normal  NECK: supple, thyroid normal size, non-tender, without nodularity LYMPH:  no palpable lymphadenopathy in the cervical, axillary or supraclavicular LUNGS: clear to auscultation with normal breathing effort HEART: regular rate & rhythm and no murmurs and 1+ lower extremity edema with chronic venous changes ABDOMEN:abdomen soft, non-tender and normal bowel sounds Musculoskeletal:no cyanosis of digits and no clubbing  NEURO: alert & oriented x 3 with fluent speech, no focal motor/sensory deficits   LABORATORY DATA: Results for orders placed in visit on 05/30/13 (from the past 48 hour(s))  CBC WITH DIFFERENTIAL     Status: Abnormal   Collection Time    05/30/13 10:23 AM      Result Value Range   WBC 4.8  3.9 - 10.3 10e3/uL   NEUT# 2.4  1.5 - 6.5 10e3/uL   HGB 10.8 (*) 11.6 - 15.9 g/dL   HCT 33.3 (*) 34.8 - 46.6 %   Platelets 220  145 - 400 10e3/uL   MCV 85.6  79.5 - 101.0 fL   MCH 27.8  25.1 - 34.0 pg   MCHC 32.5  31.5 - 36.0 g/dL   RBC 3.89  3.70 - 5.45 10e6/uL   RDW 13.5  11.2 - 14.5 %   lymph# 1.8  0.9 - 3.3 10e3/uL   MONO# 0.4  0.1 - 0.9 10e3/uL   Eosinophils Absolute 0.2  0.0 - 0.5 10e3/uL   Basophils Absolute 0.1  0.0 - 0.1 10e3/uL   NEUT% 49.1  38.4 - 76.8 %   LYMPH% 38.1  14.0 - 49.7 %   MONO% 8.3  0.0 - 14.0 %   EOS% 3.3  0.0 - 7.0 %   BASO% 1.2  0.0 - 2.0 %  IRON AND TIBC [facility]     Status: None   Collection Time    05/30/13 10:23 AM      Result Value Range   Iron 77  41 - 142 ug/dL   TIBC 355  236 - 444 ug/dL   UIBC 278  120 - 384 ug/dL   %SAT 22  21 - 57 %   FERRITIN [facility]     Status: None   Collection Time    05/30/13 10:23 AM      Result Value Range   Ferritin 73  9 - 269 ng/ml  COMPREHENSIVE METABOLIC PANEL (YY48)     Status: Abnormal   Collection Time    05/30/13 10:23 AM      Result Value Range   Sodium 143  136 - 145 mEq/L   Potassium 3.8  3.5 - 5.1 mEq/L   Chloride 105  98 - 109 mEq/L   CO2 27  22 - 29 mEq/L   Glucose 132  70 - 140 mg/dl   BUN 21.4  7.0 - 26.0 mg/dL   Creatinine 1.3 (*) 0.6 - 1.1 mg/dL   Total Bilirubin 0.39  0.20 - 1.20 mg/dL   Alkaline Phosphatase 41  40 - 150 U/L   AST 25  5 - 34 U/L  ALT 12  0 - 55 U/L   Total Protein 6.7  6.4 - 8.3 g/dL   Albumin 3.6  3.5 - 5.0 g/dL   Calcium 9.9  8.4 - 10.4 mg/dL   Anion Gap 10  3 - 11 mEq/L       Labs:  Lab Results  Component Value Date   WBC 4.8 05/30/2013   HGB 10.8* 05/30/2013   HCT 33.3* 05/30/2013   MCV 85.6 05/30/2013   PLT 220 05/30/2013   NEUTROABS 2.4 05/30/2013      Chemistry      Component Value Date/Time   NA 143 05/30/2013 1023   NA 141 01/21/2013 1258   K 3.8 05/30/2013 1023   K 3.7 01/21/2013 1258   CL 106 01/21/2013 1258   CO2 27 05/30/2013 1023   CO2 29 01/21/2013 1258   BUN 21.4 05/30/2013 1023   BUN 22 01/21/2013 1258   CREATININE 1.3* 05/30/2013 1023   CREATININE 1.2 01/21/2013 1258      Component Value Date/Time   CALCIUM 9.9 05/30/2013 1023   CALCIUM 9.5 01/21/2013 1258   ALKPHOS 41 05/30/2013 1023   AST 25 05/30/2013 1023   ALT 12 05/30/2013 1023   BILITOT 0.39 05/30/2013 1023      Basic Metabolic Panel:  Recent Labs Lab 05/30/13 1023  NA 143  K 3.8  CO2 27  GLUCOSE 132  BUN 21.4  CREATININE 1.3*  CALCIUM 9.9   GFR Estimated Creatinine Clearance: 36.3 ml/min (by C-G formula based on Cr of 1.3). Liver Function Tests:  Recent Labs Lab 05/30/13 1023  AST 25  ALT 12  ALKPHOS 41  BILITOT 0.39  PROT 6.7  ALBUMIN 3.6   CBC:  Recent Labs Lab 05/30/13 1023  WBC 4.8  NEUTROABS 2.4  HGB 10.8*  HCT 33.3*  MCV 85.6  PLT 220    Anemia work up  Recent Labs  05/30/13 1023  FERRITIN 73  TIBC 355  IRON 77   Microbiology No results found for this or any previous visit (from the past 240 hour(s)).  Studies:  No results found.   RADIOGRAPHIC STUDIES: No results found.  ASSESSMENT: [name] 80 y.o. female with a history of Anemia - Plan: ferrous sulfate 325 (65 FE) MG EC tablet, CBC with Differential, Ferritin, Iron and TIBC, CBC with Differential, Comprehensive metabolic panel (Cmet) - [facility], Ferritin, Iron and TIBC  GERD (gastroesophageal reflux disease)  Renal insufficiency creatinine 1.3   PLAN:  1. Anemia likely anemia of chronic disease/kidney disease.  -- Her iron studies does not indicate iron deficiency anemia with normal ferritin and normal iron levels of 77. Her hemoglobin remains mildly decreased at 10.8 down from 11.5 on last visit.  Her hemoglobin ranged from 10.6-11.5 over the last several months. Her white blood cell count is normal with 5.9 and platelets are normal at 220. Her MCV for her complete blood count is 85.8 with a normal RDW. This is indication of likely anemia of chronic disease. Her blood smear revealed a normocytic anemia. Given her ferritin is less than 100, we will give her a trial of ferrous sulfate 325 mg daily with breakfast.   --Her screening SPEP was within normal limits. Both her ESR and CRP was within normal limits. Her TSH screening was negative. Her folate level in April of this year was within normal limits. She is on vitamin B12 with her last level of 1620 in April. Her anemia is likely secondary to her moderate chronic  renal disease. If her hemoglobin is over 11, there is no indications for erythropoietin. We can continue to monitor closely and treat her chronic renal disease.   2. Chronic Renal Disease, mild.  --Her creatinine is 1.3 today with the remaining of her CMP unremarkable.Her creatinine clearance is 36.3 mL/min.  Her creatinine ranged from 1.2-1.4  all the last few months. Patient has been counseled to avoid nephrotoxins and to continue adequate hydration. Patient should continue with tight glycemic control and maintain a good blood pressure to decrease the chance further decline in renal function.   3. GERD/dysphagia.  --Patient had an EGD and a colonoscopy on 03/19/2013 by Dr. Olevia Perches. With iron index and her ferritin within normal limits, this is less likely to be resulting in her mild anemia. Of note she has a first degree relative with a history of colon cancer. It is as noted in HPI.   4. History of DVT.  --Patient was informed that patients with a history of DVT or more prone to DVT recurrence. He denies any symptoms of swelling in the legs and she reports mobilization. He also denies any acute shortness of breath.   5. Hypertension  -Continue low salt diet and anti-hypertensive medications per PCP.   6. Follow-up  --She will require a repeat CBC and chemistries in 6 weeks and follow up in 3 months.   All questions were answered. The patient knows to call the clinic with any problems, questions or concerns. We can certainly see the patient much sooner if necessary.  I spent 10 minutes counseling the patient face to face. The total time spent in the appointment was 15 minutes.    Thaniel Coluccio, MD 06/01/2013 8:49 AM

## 2013-06-04 ENCOUNTER — Telehealth: Payer: Self-pay | Admitting: Family Medicine

## 2013-06-04 NOTE — Telephone Encounter (Signed)
Sudie Grumbling, RN (Dr. Thornell Mule wife) called to inform us that her bp was goon in Dec.  Recently her BP has been much higher. 182/71 this morning.  She has gained 11 lbs and is complaining of edema.  Please advise.  Thanks!

## 2013-06-05 NOTE — Telephone Encounter (Signed)
Patient Information:  Caller Name: [place]  Phone: [phone]  Patient: [name], [name]  Gender: Female  DOB: 09/20/1933  Age: 80 Years  PCP: [name] (Family Practice)  Office Follow Up:  Does the office need to follow up with this patient?: Yes  Instructions For The Office: Next office visit is in April 2015. Employer Dr. Thornell Mule concerned about elevation. Patient cannot tell me blood pressure readings. Reviewed Epic.  PLEASE REVIEW.  [phone] WORK) (CELL [phone])  RN Note:  Next office visit is in April 2015. Employer Dr. Thornell Mule concerned about elevation. Patient cannot tell me blood pressure readings. Reviewed Epic.  PLEASE REVIEW.  [phone] WORK) (CELL [phone])  Symptoms  Reason For Call & Symptoms: Patient is calling in about elevated blood pressure. She states that Dr. Thornell Mule wife Juliann Pulse) called the office about her blood pressure but she has not heard anything-  Epic reviewed- note / " Sudie Grumbling, RN (Dr. Thornell Mule wife) called to inform us that her bp was goon in Dec.  Recently her BP has been much higher. 182/71 this morning.  She has gained 11 lbs and is complaining of edema.  Please advise.  Thanks!".  She states she is taking medication as directed but occasionally lightheaded. Ankles swollen but < less this morning.  Reviewed Health History In EMR: Yes  Reviewed Medications In EMR: Yes  Reviewed Allergies In EMR: Yes  Reviewed Surgeries / Procedures: Yes  Date of Onset of Symptoms: 06/04/2013  Guideline(s) Used:  High Blood Pressure  Disposition Per Guideline:   Discuss with PCP and Callback by Nurse Today  Reason For Disposition Reached:   Taking BP medications and feels is having side effects (e.g., impotence, cough, dizziness)  Advice Given:  Call Back If:  Headache, blurred vision, difficulty talking, or difficulty walking occurs  Chest pain or difficulty breathing occurs  You become worse.  RN Overrode Recommendation:  Document Patient  Next  office visit is in April 2015. Employer Dr. Thornell Mule concerned about elevation. Patient cannot tell me blood pressure readings. Reviewed Epic.  PLEASE REVIEW.  [phone] WORK) (CELL [phone])

## 2013-06-05 NOTE — Telephone Encounter (Signed)
Needs OV but I am out of office until feb 2nd  I can see  her that day  But if acute  Sudden changes and weight gain she should get OV with other provider before then .

## 2013-06-06 ENCOUNTER — Encounter: Payer: Self-pay | Admitting: Family Medicine

## 2013-06-06 ENCOUNTER — Ambulatory Visit (INDEPENDENT_AMBULATORY_CARE_PROVIDER_SITE_OTHER): Payer: Medicare Other | Admitting: Family Medicine

## 2013-06-06 VITALS — BP 164/76 | HR 76 | Temp 98.8°F | Ht 61.0 in | Wt 192.0 lb

## 2013-06-06 DIAGNOSIS — R609 Edema, unspecified: Secondary | ICD-10-CM

## 2013-06-06 DIAGNOSIS — R600 Localized edema: Secondary | ICD-10-CM

## 2013-06-06 DIAGNOSIS — I10 Essential (primary) hypertension: Secondary | ICD-10-CM

## 2013-06-06 MED ORDER — FUROSEMIDE 20 MG PO TABS: 20.0000 mg | ORAL_TABLET | Freq: Every day | ORAL | Status: DC

## 2013-06-06 NOTE — Progress Notes (Signed)
   Subjective:    Patient ID: [name], female    DOB: [birth year], 80 y.o.   MRN: [id number]  HPI Here for one week of mild swelling in both legs and both feet as well as mild increases in BP. Her BP has been in the 123456 systolic for the past several months. No HA or chest pain or SOB. Her weight is up by 4 lbs since her last visit in this office 6 weeks ago.  She has had recent labs showing stable renal function with BUN of 21.4 and creatinine of 1.3. She is anemic at Hgb of 10.8. Potassium is normal at 3.8 (she takes a total of 20 mEq daily).    Review of Systems  Constitutional: Positive for unexpected weight change. Negative for activity change, appetite change and fatigue.  Respiratory: Negative.   Cardiovascular: Positive for leg swelling. Negative for chest pain and palpitations.       Objective:   Physical Exam  Constitutional: She appears well-developed and well-nourished. No distress.  Neck: No thyromegaly present.  Cardiovascular: Normal rate, regular rhythm, normal heart sounds and intact distal pulses.   Pulmonary/Chest: Effort normal and breath sounds normal.  Musculoskeletal:  2+ edema to both feet and lower legs   Lymphadenopathy:    She has no cervical adenopathy.          Assessment & Plan:  Add Lasix 20 mg daily and follow up with Dr. Regis Bill in 2 weeks

## 2013-06-06 NOTE — Telephone Encounter (Signed)
Would you be willing to see the pt?  With the edema and weight gain, I think she should be seen sooner than 06/16/13.

## 2013-06-06 NOTE — Telephone Encounter (Signed)
Appt scheduled for 06/09/13 with Dr. Sarajane Jews.

## 2013-06-06 NOTE — Progress Notes (Signed)
Pre visit review using our clinic review tool, if applicable. No additional management support is needed unless otherwise documented below in the visit note. 

## 2013-06-06 NOTE — Telephone Encounter (Signed)
I would be happy to see her today or next week

## 2013-06-06 NOTE — Telephone Encounter (Signed)
Left message at below listed number for the pt to return my call.

## 2013-06-09 ENCOUNTER — Telehealth: Payer: Self-pay | Admitting: Internal Medicine

## 2013-06-09 ENCOUNTER — Ambulatory Visit: Payer: Medicare Other | Admitting: Family Medicine

## 2013-06-09 NOTE — Telephone Encounter (Signed)
Relevant patient education assigned to patient using Emmi. ° °

## 2013-06-20 ENCOUNTER — Ambulatory Visit (INDEPENDENT_AMBULATORY_CARE_PROVIDER_SITE_OTHER): Payer: Medicare Other | Admitting: Internal Medicine

## 2013-06-20 ENCOUNTER — Encounter: Payer: Self-pay | Admitting: Internal Medicine

## 2013-06-20 VITALS — BP 158/80 | Temp 98.0°F | Ht 61.0 in | Wt 188.0 lb

## 2013-06-20 DIAGNOSIS — F411 Generalized anxiety disorder: Secondary | ICD-10-CM

## 2013-06-20 DIAGNOSIS — I10 Essential (primary) hypertension: Secondary | ICD-10-CM

## 2013-06-20 DIAGNOSIS — R609 Edema, unspecified: Secondary | ICD-10-CM

## 2013-06-20 DIAGNOSIS — R600 Localized edema: Secondary | ICD-10-CM

## 2013-06-20 DIAGNOSIS — K219 Gastro-esophageal reflux disease without esophagitis: Secondary | ICD-10-CM

## 2013-06-20 DIAGNOSIS — R252 Cramp and spasm: Secondary | ICD-10-CM

## 2013-06-20 MED ORDER — PANTOPRAZOLE SODIUM 40 MG PO TBEC: 40.0000 mg | DELAYED_RELEASE_TABLET | Freq: Every day | ORAL | Status: DC

## 2013-06-20 MED ORDER — ALPRAZOLAM 0.25 MG PO TABS: ORAL_TABLET | ORAL | Status: DC

## 2013-06-20 MED ORDER — CLOPIDOGREL BISULFATE 75 MG PO TABS: 75.0000 mg | ORAL_TABLET | Freq: Every day | ORAL | Status: DC

## 2013-06-20 MED ORDER — METOPROLOL SUCCINATE ER 50 MG PO TB24: 50.0000 mg | ORAL_TABLET | Freq: Every day | ORAL | Status: DC

## 2013-06-20 NOTE — Patient Instructions (Addendum)
   Check potassium levels today  And for now stay on the furosemide Lasix pill once a day  We may have to adjust  potassium pills   stop the Pepcid famotidine and change to generic protonix  Can use the alprazolam as needed but not on a daily basis.  We'll refill the Plavix consider stopping if you're having bleeding.  Continue to have your blood pressure monitored like you're doing.  Followup visit  in  1 to 2 months or as needed  Change Lopressor to the extended release if not already extended release

## 2013-06-20 NOTE — Progress Notes (Signed)
Chief Complaint  Patient presents with  . Follow-up    HPI: Patient comes in today for follow up of  multiple medical problems.  Was seen last week because of increasing swelling and weight gain and placed on Lasix once a day. His help the swelling somewhat perhaps her blood pressure. Her blood pressure had been improved most recently until the swelling. She comes in with readings from [place] 146-140 over 60s someone 50s and 150 forcep occasional 138/62. 7/10 at goal   Needs a refill of the Plavix was put on this after she had recovery from a DVT and because she is allergic to aspirin was put on this by Dr. Katherine Roan but is due a refill.  She's under evaluation for her anemia supposed to do stool cards and take some iron. No active bleeding  The Pepcid given to her isn't that great for her reflux. Did better on something in the remote past.  Is taking blood pressure medicine try been sore every day as well as 20 mEq of potassium. In addition Lopresso she is unsure if this is immediate release or extended release.  She has one or 2 Xanax left uses it only as needed for anxiety.   She has problems with muscle cramps in her hands and legs this occurred before she went on the Lasix. No progression. r ROS: See pertinent positives and negatives per HPI.No fever syncope acute respiratory chest pain recently.  Past Medical History  Diagnosis Date  . Asthma     " onset when young"   . History of DVT (deep vein thrombosis)     "RLE" (09/09/2012) coumadine cant take asa so on plavix   . Allergy   . GERD (gastroesophageal reflux disease)   . Hyperlipidemia   . Hypertension   . Varicose veins   . Lupus     "cured years ago" (09/09/2012)  . Headache(784.0)     "related to my high blood pressure" (09/09/2012)  . Arthritis     "shoulders" (09/09/2012)    Family History  Problem Relation Age of Onset  . Hypertension Mother   . Hypertension Father   . Cancer Sister      Colon and Breast Cancer  . Hypertension Brother   . Heart attack Daughter   . Lung cancer      both parents     History   Social History  . Marital Status: Single    Spouse Name: N/A    Number of Children: N/A  . Years of Education: N/A   Social History Main Topics  . Smoking status: Never Smoker   . Smokeless tobacco: Never Used  . Alcohol Use: No  . Drug Use: No  . Sexual Activity: No   Other Topics Concern  . None   Social History Narrative   2 people living in the home.  Grand daughter.   Up and down through the night      Had 7 children  2 deceased . 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    works 3 days per week currently .   Glasses dentures  Neg tad     Outpatient Encounter Prescriptions as of 06/20/2013  Medication Sig  . albuterol (PROVENTIL HFA;VENTOLIN HFA) 108 (90 BASE) MCG/ACT inhaler Inhale 2 puffs into the lungs every 6 (six) hours as needed for wheezing. For wheezing  . albuterol (  PROVENTIL) (2.5 MG/3ML) 0.083% nebulizer solution Take 2.5 mg by nebulization every 6 (six) hours as needed. For wheezing  . ALPRAZolam (XANAX) 0.25 MG tablet Take half to one whole tablet bid prn  . clopidogrel (PLAVIX) 75 MG tablet Take 1 tablet (75 mg total) by mouth daily.  . fenofibrate 160 MG tablet Take 1 tablet (160 mg total) by mouth daily.  . ferrous sulfate 325 (65 FE) MG EC tablet Take 1 tablet (325 mg total) by mouth daily with breakfast.  . Fluticasone-Salmeterol (ADVAIR DISKUS) 250-50 MCG/DOSE AEPB Inhale 1 puff into the lungs 2 (two) times daily.  . furosemide (LASIX) 20 MG tablet Take 1 tablet (20 mg total) by mouth daily.  . montelukast (SINGULAIR) 10 MG tablet Take 1 tablet (10 mg total) by mouth every morning.  . potassium chloride (K-DUR,KLOR-CON) 10 MEQ tablet Take 2 tablets (20 mEq total) by mouth daily.  Jabier Gauss 40-5-25 MG TABS TAKE (1) CAPSULE DAILY.  . vitamin B-12  (CYANOCOBALAMIN) 1000 MCG tablet Take 1,000 mcg by mouth daily.  . [DISCONTINUED] ALPRAZolam (XANAX) 0.25 MG tablet Take half to one whole tablet bid prn  . [DISCONTINUED] clopidogrel (PLAVIX) 75 MG tablet Take 75 mg by mouth daily.   . [DISCONTINUED] metoprolol (LOPRESSOR) 50 MG tablet TAKE 1 TABLET ONCE DAILY.  . metoprolol succinate (TOPROL-XL) 50 MG 24 hr tablet Take 1 tablet (50 mg total) by mouth daily. Take with or immediately following a meal.  . pantoprazole (PROTONIX) 40 MG tablet Take 1 tablet (40 mg total) by mouth daily.  . [DISCONTINUED] famotidine (PEPCID) 40 MG tablet Take 40 mg by mouth. In the evening    EXAM:  BP 158/80  Temp(Src) 98 F (36.7 C) (Oral)  Ht 5\' 1"  (1.549 m)  Wt 188 lb (85.276 kg)  BMI 35.54 kg/m2  Body mass index is 35.54 kg/(m^2).  GENERAL: vitals reviewed and listed above, alert, oriented, appears well hydrated and in no acute distress HEENT: atraumatic, conjunctiva  clear, no obvious abnormalities on inspection of external nose and ears OP : no lesion edema or exudate  NECK: no obvious masses on inspection palpation No JVD LUNGS: clear to auscultation bilaterally, no wheezes, rales or rhonchi, good air movement CV: HRRR, no clubbing cyanosis  nl cap refill  1+ edema  MS: moves all extremities  oa chnages no fasciculations PSYCH: pleasant and cooperative, no obvious depression or anxiety tearfulat times thinking of lost loved ones not depressed   ASSESSMENT AND PLAN:  Discussed the following assessment and plan:  HTN (hypertension) - up and down tenuous control change to ER metoprolol - Plan: Basic metabolic panel, Magnesium  Leg cramps - predated the lasix - Plan: Basic metabolic panel, Magnesium  Leg edema - som eimpr cont lasix check K Mg  - Plan: Basic metabolic panel, Magnesium  Anxiety state, unspecified - ocass use of aprazolam  GERD (gastroesophageal reflux disease) - sub op response to pepcid change to protonox as trial  -Patient  advised to return or notify health care team  if symptoms worsen or persist or new concerns arise.  Patient Instructions   Check potassium levels today  And for now stay on the furosemide Lasix pill once a day  We may have to adjust  potassium pills   stop the Pepcid famotidine and change to generic protonix  Can use the alprazolam as needed but not on a daily basis.  We'll refill the Plavix consider stopping if you're having bleeding.  Continue to have your blood  pressure monitored like you're doing.  Followup visit  in  1 to 2 months or as needed  Change Lopressor to the extended release if not already extended release   Mariann Laster K. Leisha Trinkle M.D.  Pre visit review using our clinic review tool, if applicable. No additional management support is needed unless otherwise documented below in the visit note.

## 2013-06-23 LAB — BASIC METABOLIC PANEL
BUN: 26 mg/dL — ABNORMAL HIGH (ref 6–23)
CO2: 31 mEq/L (ref 19–32)
Calcium: 10.2 mg/dL (ref 8.4–10.5)
Chloride: 100 mEq/L (ref 96–112)
Creatinine, Ser: 1.4 mg/dL — ABNORMAL HIGH (ref 0.4–1.2)
GFR: 48.43 mL/min — AB (ref >60.00)
Glucose, Bld: 109 mg/dL — ABNORMAL HIGH (ref 70–99)
POTASSIUM: 4 meq/L (ref 3.5–5.1)
Sodium: 139 mEq/L (ref 135–145)

## 2013-06-23 LAB — MAGNESIUM: Magnesium: 2 mg/dL (ref 1.5–2.5)

## 2013-06-24 ENCOUNTER — Other Ambulatory Visit: Payer: Self-pay | Admitting: Internal Medicine

## 2013-07-08 ENCOUNTER — Telehealth: Payer: Self-pay

## 2013-07-08 NOTE — Telephone Encounter (Signed)
Dr. Thornell Mule' office called to advise that pt's blood pressure is still running high, as they have been monitoring it. Last evening it was 174/80

## 2013-07-09 NOTE — Telephone Encounter (Signed)
If she has changed over to the ER metoprolol for at least 2 weeks. If bp continues over 150 155  Change her tirbenzoar to 40/10 25 ( increase the amlodipine dosefrom 5 - 10  )  However if it makes her feel too drowsy then contact us . (Other optinos would be hydralazine . Consider seeing  cardiology or renal  for input. )  .

## 2013-07-09 NOTE — Telephone Encounter (Signed)
Left message on home phone for the pt to return my call. 

## 2013-07-11 ENCOUNTER — Other Ambulatory Visit: Payer: Self-pay | Admitting: Family Medicine

## 2013-07-11 ENCOUNTER — Other Ambulatory Visit (HOSPITAL_BASED_OUTPATIENT_CLINIC_OR_DEPARTMENT_OTHER): Payer: Medicare Other

## 2013-07-11 DIAGNOSIS — D649 Anemia, unspecified: Secondary | ICD-10-CM

## 2013-07-11 DIAGNOSIS — N189 Chronic kidney disease, unspecified: Secondary | ICD-10-CM

## 2013-07-11 LAB — CBC WITH DIFFERENTIAL/PLATELET
BASO%: 0.8 % (ref 0.0–2.0)
Basophils Absolute: 0 10*3/uL (ref 0.0–0.1)
EOS%: 4 % (ref 0.0–7.0)
Eosinophils Absolute: 0.2 10*3/uL (ref 0.0–0.5)
HEMATOCRIT: 36.8 % (ref 34.8–46.6)
HGB: 11.8 g/dL (ref 11.6–15.9)
LYMPH%: 40.2 % (ref 14.0–49.7)
MCH: 27.4 pg (ref 25.1–34.0)
MCHC: 32 g/dL (ref 31.5–36.0)
MCV: 85.5 fL (ref 79.5–101.0)
MONO#: 0.4 10*3/uL (ref 0.1–0.9)
MONO%: 7.3 % (ref 0.0–14.0)
NEUT#: 2.3 10*3/uL (ref 1.5–6.5)
NEUT%: 47.7 % (ref 38.4–76.8)
Platelets: 232 10*3/uL (ref 145–400)
RBC: 4.3 10*6/uL (ref 3.70–5.45)
RDW: 13.6 % (ref 11.2–14.5)
WBC: 4.8 10*3/uL (ref 3.9–10.3)
lymph#: 1.9 10*3/uL (ref 0.9–3.3)

## 2013-07-11 LAB — IRON AND TIBC CHCC
%SAT: 27 % (ref 21–57)
Iron: 103 ug/dL (ref 41–142)
TIBC: 379 ug/dL (ref 236–444)
UIBC: 276 ug/dL (ref 120–384)

## 2013-07-11 LAB — FERRITIN CHCC: Ferritin: 67 ng/ml (ref 9–269)

## 2013-07-11 MED ORDER — OLMESARTAN-AMLODIPINE-HCTZ 40-10-25 MG PO TABS: 1.0000 | ORAL_TABLET | Freq: Every day | ORAL | Status: DC

## 2013-07-11 NOTE — Telephone Encounter (Signed)
Spoke to the pt.  She has been taking metoprolol ER for 2 weeks.  She started it on the day that she was here.  Sent in new prescription of Tribenzor.  Pt will pick up at the pharmacy.

## 2013-07-22 ENCOUNTER — Other Ambulatory Visit: Payer: Self-pay

## 2013-07-22 DIAGNOSIS — Z1231 Encounter for screening mammogram for malignant neoplasm of breast: Secondary | ICD-10-CM

## 2013-08-07 ENCOUNTER — Other Ambulatory Visit: Payer: Self-pay | Admitting: Internal Medicine

## 2013-08-08 ENCOUNTER — Other Ambulatory Visit: Payer: Self-pay | Admitting: Family Medicine

## 2013-08-08 ENCOUNTER — Ambulatory Visit
Admission: RE | Admit: 2013-08-08 | Discharge: 2013-08-08 | Disposition: A | Discharge status: Home / Self Care | Payer: Medicare Other | Source: Ambulatory Visit

## 2013-08-08 DIAGNOSIS — I10 Essential (primary) hypertension: Secondary | ICD-10-CM

## 2013-08-08 DIAGNOSIS — Z1231 Encounter for screening mammogram for malignant neoplasm of breast: Secondary | ICD-10-CM

## 2013-08-11 ENCOUNTER — Other Ambulatory Visit (INDEPENDENT_AMBULATORY_CARE_PROVIDER_SITE_OTHER): Payer: Medicare Other

## 2013-08-11 DIAGNOSIS — I10 Essential (primary) hypertension: Secondary | ICD-10-CM

## 2013-08-11 LAB — BASIC METABOLIC PANEL
BUN: 29 mg/dL — ABNORMAL HIGH (ref 6–23)
CHLORIDE: 101 meq/L (ref 96–112)
CO2: 29 meq/L (ref 19–32)
CREATININE: 1.5 mg/dL — AB (ref 0.4–1.2)
Calcium: 9.7 mg/dL (ref 8.4–10.5)
GFR: 41.95 mL/min — ABNORMAL LOW (ref >60.00)
Glucose, Bld: 125 mg/dL — ABNORMAL HIGH (ref 70–99)
POTASSIUM: 3.5 meq/L (ref 3.5–5.1)
SODIUM: 139 meq/L (ref 135–145)

## 2013-08-18 ENCOUNTER — Ambulatory Visit (INDEPENDENT_AMBULATORY_CARE_PROVIDER_SITE_OTHER): Payer: Medicare Other | Admitting: Internal Medicine

## 2013-08-18 ENCOUNTER — Encounter: Payer: Self-pay | Admitting: Internal Medicine

## 2013-08-18 VITALS — BP 142/60 | HR 60 | Temp 98.5°F | Ht 61.0 in | Wt 190.0 lb

## 2013-08-18 DIAGNOSIS — F411 Generalized anxiety disorder: Secondary | ICD-10-CM

## 2013-08-18 DIAGNOSIS — I10 Essential (primary) hypertension: Secondary | ICD-10-CM

## 2013-08-18 DIAGNOSIS — Z634 Disappearance and death of family member: Secondary | ICD-10-CM

## 2013-08-18 DIAGNOSIS — N289 Disorder of kidney and ureter, unspecified: Secondary | ICD-10-CM

## 2013-08-18 DIAGNOSIS — D649 Anemia, unspecified: Secondary | ICD-10-CM

## 2013-08-18 MED ORDER — LORAZEPAM 0.5 MG PO TABS: 0.2500 mg | ORAL_TABLET | Freq: Three times a day (TID) | ORAL | Status: DC | PRN

## 2013-08-18 NOTE — Patient Instructions (Signed)
Blood pressure is much better . Lungs are clear today. Stay on same BP medication. Try stopping the xanax and changing to lorazapam .  Still caution with fogginess. consdier other medication if needed.  Condolences  On your brothers passing.   return office visit in 3-4 months or as needed . May do labs at the visit .

## 2013-08-18 NOTE — Progress Notes (Signed)
Chief Complaint  Patient presents with  . Follow-up    HPI: [name]  comes in today for follow up of  multiple medical problems.  BP hard to control  since last visit we have increased the amlo to 10 mg  No se noted. Has had multiple losses brother passed recenetly after illness and decline from dec  Now on xl metop no se noted  Readings per [name] et al 190 and 182 in feb  Breathing asthma"ok some sob no sig edema cahngees . Back joints still problematic  Anxiety is an issue and using xanax at times for this ?does she have to go to blood doctoro evaluating anemia  No bleeding ROS: See pertinent positives and negatives per HPI.no cp new sx   Past Medical History  Diagnosis Date  . Asthma     " onset when young"   . History of DVT (deep vein thrombosis)     "RLE" (09/09/2012) coumadine cant take asa so on plavix   . Allergy   . GERD (gastroesophageal reflux disease)   . Hyperlipidemia   . Hypertension   . Varicose veins   . Lupus     "cured years ago" (09/09/2012)  . Headache(784.0)     "related to my high blood pressure" (09/09/2012)  . Arthritis     "shoulders" (09/09/2012)    Family History  Problem Relation Age of Onset  . Hypertension Mother   . Hypertension Father   . Cancer Sister     Colon and Breast Cancer  . Hypertension Brother   . Heart attack Daughter   . Lung cancer      both parents     History   Social History  . Marital Status: Single    Spouse Name: N/A    Number of Children: N/A  . Years of Education: N/A   Social History Main Topics  . Smoking status: Never Smoker   . Smokeless tobacco: Never Used  . Alcohol Use: No  . Drug Use: No  . Sexual Activity: No   Other Topics Concern  . None   Social History Narrative   2 people living in the home.  Grand daughter.   Up and down through the night      Had 7 children  2 deceased . 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    works 3 days per week currently .   Glasses dentures  Neg tad     Outpatient Encounter Prescriptions as of 08/18/2013  Medication Sig  . albuterol (PROVENTIL HFA;VENTOLIN HFA) 108 (90 BASE) MCG/ACT inhaler Inhale 2 puffs into the lungs every 6 (six) hours as needed for wheezing. For wheezing  . albuterol (PROVENTIL) (2.5 MG/3ML) 0.083% nebulizer solution Take 2.5 mg by nebulization every 6 (six) hours as needed. For wheezing  . clopidogrel (PLAVIX) 75 MG tablet Take 1 tablet (75 mg total) by mouth daily.  . fenofibrate 160 MG tablet TAKE 1 TABLET ONCE DAILY.  . ferrous sulfate 325 (65 FE) MG EC tablet Take 1 tablet (325 mg total) by mouth daily with breakfast.  . Fluticasone-Salmeterol (ADVAIR DISKUS) 250-50 MCG/DOSE AEPB Inhale 1 puff into the lungs 2 (two) times daily.  . furosemide (LASIX) 20 MG tablet Take 1 tablet (20 mg total) by mouth daily.  . metoprolol succinate (TOPROL-XL) 50 MG 24 hr tablet Take 1 tablet (50 mg total)  by mouth daily. Take with or immediately following a meal.  . montelukast (SINGULAIR) 10 MG tablet TAKE 1 TABLET ONCE DAILY.  [name] Olmesartan-Amlodipine-HCTZ 40-10-25 MG TABS Take 1 tablet by mouth daily.  . pantoprazole (PROTONIX) 40 MG tablet Take 1 tablet (40 mg total) by mouth daily.  . potassium chloride (K-DUR,KLOR-CON) 10 MEQ tablet Take 2 tablets (20 mEq total) by mouth daily.  [name] PROAIR HFA 108 (90 BASE) MCG/ACT inhaler USE 2 PUFFS EVERY 6 HOURS AS NEEDED FOR WHEEZING.  . vitamin B-12 (CYANOCOBALAMIN) 1000 MCG tablet Take 1,000 mcg by mouth daily.  . [DISCONTINUED] ALPRAZolam (XANAX) 0.25 MG tablet Take half to one whole tablet bid prn  . LORazepam (ATIVAN) 0.5 MG tablet Take 0.5-1 tablets (0.25-0.5 mg total) by mouth every 8 (eight) hours as needed for anxiety or sleep.    EXAM:  BP 142/60  Pulse 60  Temp(Src) 98.5 F (36.9 C) (Oral)  Ht 5\' 1"  (1.549 m)  Wt 190 lb (86.183 kg)  BMI 35.92 kg/m2  SpO2 98%  Body mass index is  35.92 kg/(m^2). Repeated bp and in low 140 range today GENERAL: vitals reviewed and listed above, alert, oriented, appears well hydrated and in no acute distress HEENT: atraumatic, conjunctiva  clear, no obvious abnormalities on inspection of external nose and ears NECK: no obvious masses on inspection palpation  No jvd  LUNGS: clear to auscultation bilaterally, no wheezes, rales or rhonchi slightly hoarse  CV: HRRR, no clubbing cyanosis slight   peripheral edema nl cap refill  MS: moves all extremities without noticeable focal  abnormality PSYCH: pleasant and cooperative, no obvious depression or anxiety somewhat sad  Cognition appears intact  Lab Results  Component Value Date   WBC 4.9 08/22/2013   HGB 10.5* 08/22/2013   HCT 32.7* 08/22/2013   PLT 203 08/22/2013   GLUCOSE 144* 08/22/2013   CHOL 171 09/09/2012   TRIG 148 09/09/2012   HDL 47 09/09/2012   LDLCALC 94 09/09/2012   ALT <6 08/22/2013   AST 17 08/22/2013   NA 144 08/22/2013   K 3.8 08/22/2013   CL 101 08/11/2013   CREATININE 1.8* 08/22/2013   BUN 35.6* 08/22/2013   CO2 24 08/22/2013   TSH 1.786 09/09/2012   INR 0.94 12/22/2009    ASSESSMENT AND PLAN:  Discussed the following assessment and plan:  HTN (hypertension) - better today   acceptable   Renal insufficiency  Anxiety state, unspecified prob reactive  - change alpr tp loraz and follow   Death of family member - brother  Anemia - eval per heme poss form CKD Anemia cougl be chornic disease  Check creatinine and follow  -Patient advised to return or notify health care team  if symptoms worsen ,persist or new concerns arise.  Patient Instructions  Blood pressure is much better . Lungs are clear today. Stay on same BP medication. Try stopping the xanax and changing to lorazapam .  Still caution with fogginess. consdier other medication if needed.  Condolences  On your brothers passing.   return office visit in 3-4 months or as needed . May do labs at the visit .       [name]. Panosh M.D.  Pre visit review using our clinic review tool, if applicable. No additional management support is needed unless otherwise documented below in the visit note.

## 2013-08-22 ENCOUNTER — Other Ambulatory Visit (HOSPITAL_BASED_OUTPATIENT_CLINIC_OR_DEPARTMENT_OTHER): Payer: Medicare Other

## 2013-08-22 DIAGNOSIS — D649 Anemia, unspecified: Secondary | ICD-10-CM

## 2013-08-22 LAB — CBC WITH DIFFERENTIAL/PLATELET
BASO%: 0.6 % (ref 0.0–2.0)
BASOS ABS: 0 10*3/uL (ref 0.0–0.1)
EOS%: 2.8 % (ref 0.0–7.0)
Eosinophils Absolute: 0.1 10*3/uL (ref 0.0–0.5)
HEMATOCRIT: 32.7 % — AB (ref 34.8–46.6)
HEMOGLOBIN: 10.5 g/dL — AB (ref 11.6–15.9)
LYMPH%: 35.4 % (ref 14.0–49.7)
MCH: 27.4 pg (ref 25.1–34.0)
MCHC: 32 g/dL (ref 31.5–36.0)
MCV: 85.4 fL (ref 79.5–101.0)
MONO#: 0.5 10*3/uL (ref 0.1–0.9)
MONO%: 9.6 % (ref 0.0–14.0)
NEUT#: 2.5 10*3/uL (ref 1.5–6.5)
NEUT%: 51.6 % (ref 38.4–76.8)
PLATELETS: 203 10*3/uL (ref 145–400)
RBC: 3.83 10*6/uL (ref 3.70–5.45)
RDW: 13.9 % (ref 11.2–14.5)
WBC: 4.9 10*3/uL (ref 3.9–10.3)
lymph#: 1.7 10*3/uL (ref 0.9–3.3)

## 2013-08-22 LAB — COMPREHENSIVE METABOLIC PANEL (CC13)
ALBUMIN: 3.7 g/dL (ref 3.5–5.0)
ALK PHOS: 44 U/L (ref 40–150)
ALT: <6 U/L (ref 0–55)
ANION GAP: 12 meq/L — AB (ref 3–11)
AST: 17 U/L (ref 5–34)
BILIRUBIN TOTAL: 0.26 mg/dL (ref 0.20–1.20)
BUN: 35.6 mg/dL — AB (ref 7.0–26.0)
CO2: 24 mEq/L (ref 22–29)
Calcium: 9.9 mg/dL (ref 8.4–10.4)
Chloride: 107 mEq/L (ref 98–109)
Creatinine: 1.8 mg/dL — ABNORMAL HIGH (ref 0.6–1.1)
GLUCOSE: 144 mg/dL — AB (ref 70–140)
Potassium: 3.8 mEq/L (ref 3.5–5.1)
Sodium: 144 mEq/L (ref 136–145)
Total Protein: 6.8 g/dL (ref 6.4–8.3)

## 2013-08-22 LAB — IRON AND TIBC CHCC
%SAT: 18 % — ABNORMAL LOW (ref 21–57)
Iron: 62 ug/dL (ref 41–142)
TIBC: 348 ug/dL (ref 236–444)
UIBC: 286 ug/dL (ref 120–384)

## 2013-08-22 LAB — FERRITIN CHCC: FERRITIN: 75 ng/mL (ref 9–269)

## 2013-08-23 DIAGNOSIS — Z634 Disappearance and death of family member: Secondary | ICD-10-CM | POA: Insufficient documentation

## 2013-08-23 HISTORY — DX: Disappearance and death of family member: Z63.4

## 2013-08-23 NOTE — Assessment & Plan Note (Signed)
Would preferand change  lorazepam over alpraz for rebound reasons  Risk benefit discussed ok to use some at this time  . And fu .

## 2013-09-01 ENCOUNTER — Other Ambulatory Visit: Payer: Self-pay | Admitting: Internal Medicine

## 2013-09-02 NOTE — Telephone Encounter (Signed)
Ok to refill  X 6 months

## 2013-09-04 ENCOUNTER — Other Ambulatory Visit: Payer: Self-pay | Admitting: Family Medicine

## 2013-09-09 ENCOUNTER — Encounter: Payer: Self-pay | Admitting: Internal Medicine

## 2013-09-20 ENCOUNTER — Other Ambulatory Visit: Payer: Self-pay | Admitting: Internal Medicine

## 2013-09-29 ENCOUNTER — Telehealth: Payer: Self-pay | Admitting: Internal Medicine

## 2013-09-29 NOTE — Telephone Encounter (Signed)
Pt would like an appt tomorrow for foot pain ?gout. Can I use sda?

## 2013-09-29 NOTE — Telephone Encounter (Signed)
Left a message at the below listed number for the pt to return my call. 

## 2013-09-30 ENCOUNTER — Other Ambulatory Visit: Payer: Self-pay | Admitting: Orthopedic Surgery

## 2013-09-30 NOTE — Telephone Encounter (Signed)
Left a message for the pt to return my call.  

## 2013-09-30 NOTE — Telephone Encounter (Signed)
Pt returned your call. Pt is going out and will return about 4:30pm if you don't get her.

## 2013-10-02 NOTE — Telephone Encounter (Signed)
Left a message at the below listed number.  Unable to make contact with the pt.  Will now send a letter.

## 2013-10-09 ENCOUNTER — Telehealth: Payer: Self-pay | Admitting: Internal Medicine

## 2013-10-09 NOTE — Telephone Encounter (Signed)
Yes  please document what surgery etc.

## 2013-10-09 NOTE — Telephone Encounter (Signed)
Pt called she is having surgery on 10/21/13 and need to know if she can come off her plavix  On 10/17/13

## 2013-10-09 NOTE — Telephone Encounter (Signed)
Left a message at the below listed number for the pt to return my call. 

## 2013-10-10 NOTE — Telephone Encounter (Signed)
Spoke to the pt.  Informed her to stop ASA when instructed by surgeons.  She is having surgery on her hands but was unable to elaborate.

## 2013-10-15 ENCOUNTER — Encounter (HOSPITAL_BASED_OUTPATIENT_CLINIC_OR_DEPARTMENT_OTHER): Payer: Self-pay | Admitting: *Deleted

## 2013-10-15 NOTE — Progress Notes (Addendum)
Bring all medications and inhalers.Pt coming tomorrow for BMET , CBC and EKG - Dr. Al Corpus wants these labs and EKG even though she had labs last month and Ekg in Sept. Pt told to stop Plavix- takes for Blood clots in legs according to pt. - saw note in epic from Dr.

## 2013-10-16 ENCOUNTER — Encounter (HOSPITAL_BASED_OUTPATIENT_CLINIC_OR_DEPARTMENT_OTHER)
Admission: RE | Admit: 2013-10-16 | Discharge: 2013-10-16 | Disposition: A | Discharge status: Home / Self Care | Payer: Medicare Other | Source: Ambulatory Visit | Attending: Orthopedic Surgery | Admitting: Orthopedic Surgery

## 2013-10-16 ENCOUNTER — Other Ambulatory Visit: Payer: Self-pay

## 2013-10-16 DIAGNOSIS — Z01810 Encounter for preprocedural cardiovascular examination: Secondary | ICD-10-CM | POA: Insufficient documentation

## 2013-10-16 LAB — BASIC METABOLIC PANEL
BUN: 28 mg/dL — ABNORMAL HIGH (ref 6–23)
CO2: 28 mEq/L (ref 19–32)
Calcium: 10 mg/dL (ref 8.4–10.5)
Chloride: 106 mEq/L (ref 96–112)
Creatinine, Ser: 1.5 mg/dL — ABNORMAL HIGH (ref 0.50–1.10)
GFR, EST AFRICAN AMERICAN: 38 mL/min — AB (ref >90)
GFR, EST NON AFRICAN AMERICAN: 32 mL/min — AB (ref >90)
Glucose, Bld: 129 mg/dL — ABNORMAL HIGH (ref 70–99)
POTASSIUM: 3.9 meq/L (ref 3.7–5.3)
SODIUM: 145 meq/L (ref 137–147)

## 2013-10-16 LAB — CBC
HCT: 31.7 % — ABNORMAL LOW (ref 36.0–46.0)
HEMOGLOBIN: 10.2 g/dL — AB (ref 12.0–15.0)
MCH: 27.9 pg (ref 26.0–34.0)
MCHC: 32.2 g/dL (ref 30.0–36.0)
MCV: 86.8 fL (ref 78.0–100.0)
Platelets: 178 10*3/uL (ref 150–400)
RBC: 3.65 MIL/uL — AB (ref 3.87–5.11)
RDW: 13.6 % (ref 11.5–15.5)
WBC: 4.4 10*3/uL (ref 4.0–10.5)

## 2013-10-16 NOTE — Progress Notes (Signed)
ekg reviewed by Dr Ola Spurr,  Pt has extremely red eye rt, clear drainage,  Denies pain.  Spoke with Dr Ola Spurr pt to see eye doctor asap.  Pt will call if she has infection.

## 2013-10-17 NOTE — Progress Notes (Signed)
Pt called states saw Dr. Rosana Hoes @ Dr. Payton [facility] office. States did not have infection in eye just "broken blood vessel in eye treating with eye gtts". Dr Rosana Hoes wil recheck on Monday. Pt will bring note from opthamology DOS.

## 2013-10-21 ENCOUNTER — Encounter (HOSPITAL_BASED_OUTPATIENT_CLINIC_OR_DEPARTMENT_OTHER)
Admission: RE | Disposition: A | Discharge status: Home / Self Care | Payer: Self-pay | Source: Ambulatory Visit | Attending: Orthopedic Surgery

## 2013-10-21 ENCOUNTER — Encounter (HOSPITAL_BASED_OUTPATIENT_CLINIC_OR_DEPARTMENT_OTHER): Payer: Medicare Other | Admitting: Anesthesiology

## 2013-10-21 ENCOUNTER — Ambulatory Visit (HOSPITAL_BASED_OUTPATIENT_CLINIC_OR_DEPARTMENT_OTHER)
Admission: RE | Admit: 2013-10-21 | Discharge: 2013-10-21 | Disposition: A | Discharge status: Home / Self Care | Payer: Medicare Other | Source: Ambulatory Visit | Attending: Orthopedic Surgery | Admitting: Orthopedic Surgery

## 2013-10-21 ENCOUNTER — Ambulatory Visit (HOSPITAL_BASED_OUTPATIENT_CLINIC_OR_DEPARTMENT_OTHER): Payer: Medicare Other | Admitting: Anesthesiology

## 2013-10-21 ENCOUNTER — Encounter (HOSPITAL_BASED_OUTPATIENT_CLINIC_OR_DEPARTMENT_OTHER): Payer: Self-pay | Admitting: Orthopedic Surgery

## 2013-10-21 DIAGNOSIS — Z86718 Personal history of other venous thrombosis and embolism: Secondary | ICD-10-CM | POA: Insufficient documentation

## 2013-10-21 DIAGNOSIS — Z7902 Long term (current) use of antithrombotics/antiplatelets: Secondary | ICD-10-CM | POA: Insufficient documentation

## 2013-10-21 DIAGNOSIS — K219 Gastro-esophageal reflux disease without esophagitis: Secondary | ICD-10-CM | POA: Insufficient documentation

## 2013-10-21 DIAGNOSIS — F3289 Other specified depressive episodes: Secondary | ICD-10-CM | POA: Insufficient documentation

## 2013-10-21 DIAGNOSIS — I10 Essential (primary) hypertension: Secondary | ICD-10-CM | POA: Insufficient documentation

## 2013-10-21 DIAGNOSIS — G5600 Carpal tunnel syndrome, unspecified upper limb: Secondary | ICD-10-CM | POA: Insufficient documentation

## 2013-10-21 DIAGNOSIS — M65839 Other synovitis and tenosynovitis, unspecified forearm: Secondary | ICD-10-CM | POA: Insufficient documentation

## 2013-10-21 DIAGNOSIS — D649 Anemia, unspecified: Secondary | ICD-10-CM | POA: Insufficient documentation

## 2013-10-21 DIAGNOSIS — M65849 Other synovitis and tenosynovitis, unspecified hand: Secondary | ICD-10-CM

## 2013-10-21 DIAGNOSIS — J45909 Unspecified asthma, uncomplicated: Secondary | ICD-10-CM | POA: Insufficient documentation

## 2013-10-21 DIAGNOSIS — F329 Major depressive disorder, single episode, unspecified: Secondary | ICD-10-CM | POA: Insufficient documentation

## 2013-10-21 DIAGNOSIS — F411 Generalized anxiety disorder: Secondary | ICD-10-CM | POA: Insufficient documentation

## 2013-10-21 HISTORY — PX: CARPAL TUNNEL RELEASE: SHX101

## 2013-10-21 HISTORY — PX: TRIGGER FINGER RELEASE: SHX641

## 2013-10-21 HISTORY — DX: Anxiety disorder, unspecified: F41.9

## 2013-10-21 HISTORY — DX: Anemia, unspecified: D64.9

## 2013-10-21 SURGERY — CARPAL TUNNEL RELEASE
Anesthesia: General | Site: Hand | Laterality: Right

## 2013-10-21 MED ORDER — LACTATED RINGERS IV SOLN
INTRAVENOUS | Status: DC
  Administered 2013-10-21: 08:00:00 via INTRAVENOUS

## 2013-10-21 MED ORDER — LIDOCAINE HCL (CARDIAC) 20 MG/ML IV SOLN
INTRAVENOUS | Status: DC | PRN
  Administered 2013-10-21: 50 mg via INTRAVENOUS

## 2013-10-21 MED ORDER — BUPIVACAINE HCL (PF) 0.25 % IJ SOLN
INTRAMUSCULAR | Status: DC | PRN
  Administered 2013-10-21: 8 mL

## 2013-10-21 MED ORDER — FENTANYL CITRATE 0.05 MG/ML IJ SOLN
INTRAMUSCULAR | Status: AC
  Filled 2013-10-21: qty 4

## 2013-10-21 MED ORDER — FENTANYL CITRATE 0.05 MG/ML IJ SOLN: 50.0000 ug | INTRAMUSCULAR | Status: DC | PRN

## 2013-10-21 MED ORDER — FENTANYL CITRATE 0.05 MG/ML IJ SOLN
INTRAMUSCULAR | Status: DC | PRN
  Administered 2013-10-21: 100 ug via INTRAVENOUS

## 2013-10-21 MED ORDER — VANCOMYCIN HCL IN DEXTROSE 500-5 MG/100ML-% IV SOLN
INTRAVENOUS | Status: AC
  Filled 2013-10-21: qty 200

## 2013-10-21 MED ORDER — VANCOMYCIN HCL IN DEXTROSE 1-5 GM/200ML-% IV SOLN
1000.0000 mg | INTRAVENOUS | Status: AC
  Administered 2013-10-21: 1000 mg via INTRAVENOUS

## 2013-10-21 MED ORDER — CHLORHEXIDINE GLUCONATE 4 % EX LIQD: 60.0000 mL | Freq: Once | CUTANEOUS | Status: DC

## 2013-10-21 MED ORDER — HYDROCODONE-ACETAMINOPHEN 5-325 MG PO TABS: 1.0000 | ORAL_TABLET | Freq: Four times a day (QID) | ORAL | Status: DC | PRN

## 2013-10-21 MED ORDER — MIDAZOLAM HCL 2 MG/2ML IJ SOLN: 1.0000 mg | INTRAMUSCULAR | Status: DC | PRN

## 2013-10-21 MED ORDER — PROPOFOL 10 MG/ML IV BOLUS
INTRAVENOUS | Status: DC | PRN
  Administered 2013-10-21: 150 mg via INTRAVENOUS

## 2013-10-21 MED ORDER — CHLORHEXIDINE GLUCONATE 4 % EX LIQD
60.0000 mL | Freq: Once | CUTANEOUS | Status: DC
Start: 2013-10-21 — End: 2013-10-21

## 2013-10-21 MED ORDER — FENTANYL CITRATE 0.05 MG/ML IJ SOLN: 25.0000 ug | INTRAMUSCULAR | Status: DC | PRN

## 2013-10-21 MED ORDER — MIDAZOLAM HCL 2 MG/2ML IJ SOLN
INTRAMUSCULAR | Status: AC
  Filled 2013-10-21: qty 2

## 2013-10-21 SURGICAL SUPPLY — 50 items
ADH SKN CLS APL DERMABOND .7 (GAUZE/BANDAGES/DRESSINGS) ×4
BANDAGE COBAN STERILE 2 (GAUZE/BANDAGES/DRESSINGS) ×3 IMPLANT
BLADE MINI RND TIP GREEN BEAV (BLADE) ×3 IMPLANT
BLADE SURG 15 STRL LF DISP TIS (BLADE) ×2 IMPLANT
BLADE SURG 15 STRL SS (BLADE) ×3
BNDG CMPR 9X4 STRL LF SNTH (GAUZE/BANDAGES/DRESSINGS)
BNDG COHESIVE 3X5 TAN STRL LF (GAUZE/BANDAGES/DRESSINGS) ×6 IMPLANT
BNDG ESMARK 4X9 LF (GAUZE/BANDAGES/DRESSINGS) IMPLANT
BNDG GAUZE ELAST 4 BULKY (GAUZE/BANDAGES/DRESSINGS) ×6 IMPLANT
CHLORAPREP W/TINT 26ML (MISCELLANEOUS) ×4 IMPLANT
CORDS BIPOLAR (ELECTRODE) ×3 IMPLANT
COVER MAYO STAND STRL (DRAPES) ×6 IMPLANT
COVER TABLE BACK 60X90 (DRAPES) ×3 IMPLANT
CUFF TOURNIQUET SINGLE 18IN (TOURNIQUET CUFF) ×3 IMPLANT
DECANTER SPIKE VIAL GLASS SM (MISCELLANEOUS) IMPLANT
DERMABOND ADVANCED (GAUZE/BANDAGES/DRESSINGS) ×2
DERMABOND ADVANCED .7 DNX12 (GAUZE/BANDAGES/DRESSINGS) ×4 IMPLANT
DRAPE EXTREMITY T 121X128X90 (DRAPE) ×6 IMPLANT
DRAPE SURG 17X23 STRL (DRAPES) ×6 IMPLANT
DRSG PAD ABDOMINAL 8X10 ST (GAUZE/BANDAGES/DRESSINGS) ×6 IMPLANT
GAUZE SPONGE 4X4 12PLY STRL (GAUZE/BANDAGES/DRESSINGS) ×3 IMPLANT
GAUZE XEROFORM 1X8 LF (GAUZE/BANDAGES/DRESSINGS) ×3 IMPLANT
GLOVE BIO SURGEON STRL SZ7.5 (GLOVE) ×1 IMPLANT
GLOVE BIOGEL PI IND STRL 7.0 (GLOVE) IMPLANT
GLOVE BIOGEL PI IND STRL 8 (GLOVE) IMPLANT
GLOVE BIOGEL PI IND STRL 8.5 (GLOVE) ×2 IMPLANT
GLOVE BIOGEL PI INDICATOR 7.0 (GLOVE) ×1
GLOVE BIOGEL PI INDICATOR 8 (GLOVE) ×1
GLOVE BIOGEL PI INDICATOR 8.5 (GLOVE) ×1
GLOVE ECLIPSE 6.5 STRL STRAW (GLOVE) ×1 IMPLANT
GLOVE SURG ORTHO 8.0 STRL STRW (GLOVE) ×3 IMPLANT
GOWN STRL REUS W/ TWL LRG LVL3 (GOWN DISPOSABLE) ×2 IMPLANT
GOWN STRL REUS W/TWL LRG LVL3 (GOWN DISPOSABLE) ×3
GOWN STRL REUS W/TWL XL LVL3 (GOWN DISPOSABLE) ×4 IMPLANT
NEEDLE 27GAX1X1/2 (NEEDLE) ×3 IMPLANT
NS IRRIG 1000ML POUR BTL (IV SOLUTION) ×3 IMPLANT
PACK BASIN DAY SURGERY FS (CUSTOM PROCEDURE TRAY) ×3 IMPLANT
PAD ALCOHOL SWAB (MISCELLANEOUS) ×6 IMPLANT
PAD CAST 3X4 CTTN HI CHSV (CAST SUPPLIES) IMPLANT
PADDING CAST ABS 4INX4YD NS (CAST SUPPLIES) ×1
PADDING CAST ABS COTTON 4X4 ST (CAST SUPPLIES) ×2 IMPLANT
PADDING CAST COTTON 3X4 STRL (CAST SUPPLIES)
SPONGE GAUZE 4X4 12PLY STER LF (GAUZE/BANDAGES/DRESSINGS) ×1 IMPLANT
STOCKINETTE 4X48 STRL (DRAPES) ×6 IMPLANT
SUT VICRYL 4-0 PS2 18IN ABS (SUTURE) IMPLANT
SUT VICRYL RAPIDE 4/0 PS 2 (SUTURE) ×3 IMPLANT
SYR BULB 3OZ (MISCELLANEOUS) ×3 IMPLANT
SYR CONTROL 10ML LL (SYRINGE) ×3 IMPLANT
TOWEL OR 17X24 6PK STRL BLUE (TOWEL DISPOSABLE) ×6 IMPLANT
UNDERPAD 30X30 INCONTINENT (UNDERPADS AND DIAPERS) ×3 IMPLANT

## 2013-10-21 NOTE — Transfer of Care (Signed)
Immediate Anesthesia Transfer of Care Note  Patient: [name]  Procedure(s) Performed: Procedure(s): BILATERAL  CARPAL TUNNEL RELEASE (Bilateral) RELEASE TRIGGER FINGER/A-1 PULLEY RIGHT MIDDLE AND RIGHT RING (Right)  Patient Location: PACU  Anesthesia Type:General  Level of Consciousness: sedated  Airway & Oxygen Therapy: Patient Spontanous Breathing and Patient connected to face mask oxygen  Post-op Assessment: Report given to PACU RN and Post -op Vital signs reviewed and stable  Post vital signs: Reviewed and stable  Complications: No apparent anesthesia complications

## 2013-10-21 NOTE — Anesthesia Preprocedure Evaluation (Signed)
Anesthesia Evaluation  Patient identified by MRN, date of birth, ID band Patient awake    Reviewed: Allergy & Precautions, H&P , NPO status , Patient's Chart, lab work & pertinent test results, reviewed documented beta blocker date and time   Airway Mallampati: II TM Distance: >3 FB Neck ROM: Full    Dental no notable dental hx. (+) Upper Dentures, Lower Dentures, Dental Advisory Given   Pulmonary neg pulmonary ROS, asthma ,  breath sounds clear to auscultation  Pulmonary exam normal       Cardiovascular hypertension, Pt. on medications and Pt. on home beta blockers + Peripheral Vascular Disease Rhythm:Regular Rate:Normal     Neuro/Psych  Headaches, Anxiety Depression    GI/Hepatic negative GI ROS, Neg liver ROS, GERD-  Medicated and Controlled,  Endo/Other  negative endocrine ROS  Renal/GU Renal disease  negative genitourinary   Musculoskeletal   Abdominal   Peds  Hematology negative hematology ROS (+) anemia ,   Anesthesia Other Findings   Reproductive/Obstetrics negative OB ROS                           Anesthesia Physical Anesthesia Plan  ASA: III  Anesthesia Plan: General   Post-op Pain Management:    Induction: Intravenous  Airway Management Planned: LMA  Additional Equipment:   Intra-op Plan:   Post-operative Plan: Extubation in OR  Informed Consent: I have reviewed the patients History and Physical, chart, labs and discussed the procedure including the risks, benefits and alternatives for the proposed anesthesia with the patient or authorized representative who has indicated his/her understanding and acceptance.   Dental advisory given  Plan Discussed with: CRNA  Anesthesia Plan Comments:         Anesthesia Quick Evaluation

## 2013-10-21 NOTE — Op Note (Signed)
Dictation Number [id number]

## 2013-10-21 NOTE — Op Note (Signed)
NAME:  [name], [name] NO.:  [id number]  MEDICAL RECORD NO.:  [id number]  LOCATION:                                 FACILITY:  PHYSICIAN:  Daryll Brod, M.D.            DATE OF BIRTH:  DATE OF PROCEDURE:  10/21/2013 DATE OF DISCHARGE:                              OPERATIVE REPORT   PREOPERATIVE DIAGNOSIS:  Bilateral carpal tunnel syndrome with stenosing tenosynovitis, right middle and right ring finger.  POSTOPERATIVE DIAGNOSIS:  Bilateral carpal tunnel syndrome with stenosing tenosynovitis, right middle and right ring finger.  OPERATION: 1. Release median nerve, right wrist. 2. Release median, nerve left wrist. 3. Release A1 pulley, right middle. 4. Release A1 pulley, right ring finger.  SURGEON:  Daryll Brod, MD  ASSISTANT:  Leanora Cover, MD  ANESTHESIA:  General with local infiltration.  ANESTHESIOLOGIST:  Lorrene Reid, M.D.  HISTORY:  The patient is a 80 year old female with a history of bilateral carpal tunnel syndrome.  Nerve conduction is positive not responsive to conservative treatment.  She has developed triggering of her right middle and right ring finger.  She has elected to undergo bilateral release.  She is aware that there is no guarantee with the surgery, possibility of infection, recurrence of injury to arteries, nerves, tendons, incomplete relief of symptoms, and dystrophy.  In the preoperative area, the patient is seen, the extremity marked by both patient and surgeon.  Antibiotic given.  PROCEDURE IN DETAIL:  The patient was brought to the operating room, where a general anesthetic was carried out without difficulty under the direction of Dr. Al Corpus.  She was prepped using ChloraPrep, supine position with the right arm free first.  Prep was done with ChloraPrep. Forearm tourniquet was inflated to 250 mmHg after taking a time-out and a 3-minute dry time was allowed.  An oblique incision was made over the A1 pulley of the ring finger  carried down through subcutaneous tissue. Bleeders were electrocauterized with bipolar.  The A1 pulley was identified.  Retractor was placed protecting neurovascular bundles.  The A1 pulley was released on its radial aspect.  A small incision made centrally in A2.  The 2 tendons separated to divide any adhesions between the tenosynovial tissue.  No further triggering was noted.  The wound was irrigated.  A separate incision was then made on the middle finger carried down through subcutaneous tissue __________ metacarpophalangeal joint.  A retractor was placed protecting the radial and ulnar neurovascular bundles.  The A1 pulley was identified.  Two cysts were present on the flexor tendon sheath.  These were each excised.  The A1 pulley was then released on its radial aspect.  A small incision made centrally in A2.  A partial tenosynovectomy performed proximally with blunt dissection.  The finger placed through a full range of motion, no further triggering was noted.  The wound was irrigated.  A separate incision was then made in the right palm, carried down through subcutaneous tissue.  Bleeders again electrocauterized with bipolar.  Palmar fascia was split.  Superficial palmar arch identified. The flexor tendon to the ring and little finger identified.  To the ulnar side of the  median nerve, the carpal retinaculum was incised with sharp dissection.  Right angle and Sewall retractor were placed between the skin and forearm fascia.  The fascia was released for approximately a centimeter and half to two under direct vision.  Significant scarring was present about the nerve.  This was dissected free from the overlying retinaculum.  Compression was apparent.  No further lesions were identified.  The wound was irrigated.  Each of the wounds were then separately closed with a subcuticular 4-0 Vicryl Rapide sutures, burying the knot.  A local infiltration given with 0.25% Marcaine  without epinephrine, approximately 8 mL was used.  Dermabond was then placed after drying the area with alcohol.  This allowed healing of each of the wounds.  A sterile compressive dressing was applied with the fingers free.  On deflation of the tourniquet, all fingers immediately pinked. The drapes were then removed, it was then prepped and draped using ChloraPrep, supine position, left arm free.  A tourniquet placed on the forearm was inflated to 250 mmHg.  After taking a time-out confirming the patient and procedure, a longitudinal incision made in the left palm, carried down through subcutaneous tissue.  Bleeders were electrocauterized with bipolar.  Palmar fascia was split, superficial palmar arch identified.  The flexor tendon to the ring and little finger identified.  To the ulnar side of the median nerve, the carpal retinaculum was incised with sharp dissection.  Right angle and Sewall retractor were placed between skin and forearm fascia.  The fascia was released for approximately a centimeter and half proximal to the wrist crease under direct vision.  Canal was explored.  The motor branch noted to enter into muscle.  No further lesions were identified.  The wound was irrigated and closed with subcuticular 4-0 Vicryl Rapide sutures.  A local infiltration with 0.25% Marcaine without epinephrine was given, approximately 5 mL was used.  The wound was then Dermabond.  A nonadherent gauze placed.  A compressive dressing with the fingers free applied.  On deflation of the tourniquet, all fingers immediately pinked.  She was taken to the recovery room for observation in satisfactory condition.  She will be discharged home to return to the [facility] in 1 week on Vicodin.  She will remove the dressing tomorrow, place Band-Aids only.  She is allowed to get this wet.          ______________________________ Daryll Brod, M.D.     GK/MEDQ  D:  10/21/2013  T:  10/21/2013   Job:  [id number]

## 2013-10-21 NOTE — H&P (Signed)
[name] is a 80 year-old left-hand dominant female with a history of catching of her left ring finger. This has been treated and resolved .She recalls no history of injury. She complains of constant, extremely severe sharp, throbbing aching type pain to both hands now.  Activity makes it worse.  She has been taking Tylenol.  She has history of arthritis and gout.  No history of diabetes or thyroid problems. There is no family history of diabetes.She gas developed triggering of her right middle and right ring fingers and bilateral carpal tunnel syndrome. Her nerve conductions are positive.    ALLERGIES:   Aspirin, penicillin.  MEDICATIONS:    Metoprolol, verapamil, Montelukast, losartan, clopidogrel and famotidine.  SURGICAL HISTORY:    Bilateral rotator cuff repairs   FAMILY MEDICAL HISTORY:    Positive for high blood pressure, arthritis.  SOCIAL HISTORY:    She does not smoke or drink. She is divorced.   REVIEW OF SYSTEMS:     Positive for glasses, high blood pressure, blood clots, asthma, otherwise negative.  [name] is an 80 y.o. female.   Chief Complaint: Bilateral CTS and triggers RMF and RRF HPI: see above  Past Medical History  Diagnosis Date  . Asthma     " onset when young"   . History of DVT (deep vein thrombosis)     "RLE" (09/09/2012) coumadine cant take asa so on plavix   . Allergy   . GERD (gastroesophageal reflux disease)   . Hyperlipidemia   . Hypertension   . Varicose veins   . Lupus     "cured years ago" (09/09/2012)  . Headache(784.0)     "related to my high blood pressure" (09/09/2012)  . Arthritis     "shoulders" (09/09/2012)  . Anxiety   . Anemia     Past Surgical History  Procedure Laterality Date  . Abdominal hysterectomy      partial  . Breast surgery    . Carpal tunnel release Right 2000's  . Shoulder open rotator cuff repair Bilateral 2000's    Family History  Problem Relation Age of Onset  . Hypertension Mother   . Hypertension  Father   . Cancer Sister     Colon and Breast Cancer  . Hypertension Brother   . Heart attack Daughter   . Lung cancer      both parents    Social History:  reports that she has never smoked. She has never used smokeless tobacco. She reports that she does not drink alcohol or use illicit drugs.  Allergies:  Allergies  Allergen Reactions  . Aspirin Other (See Comments)    wheezing  . Penicillins Nausea And Vomiting and Other (See Comments)    Wheezing    No prescriptions prior to admission    No results found for this or any previous visit (from the past 48 hour(s)).  No results found.   Pertinent items are noted in HPI.  Height 5\' 1"  (1.549 m), weight 81.647 kg (180 lb).  General appearance: alert, cooperative and appears stated age Head: Normocephalic, without obvious abnormality Neck: no JVD Resp: clear to auscultation bilaterally Cardio: regular rate and rhythm, S1, S2 normal, no murmur, click, rub or gallop GI: soft, non-tender; bowel sounds normal; no masses,  no organomegaly Extremities: Trigger rmf and rrf Pulses: 2+ and symmetric Skin: Skin color, texture, turgor normal. No rashes or lesions Neurologic: Grossly normal Incision/Wound: na  Assessment/Plan She has elected to undergo bilateral carpal tunnel release along  with release of the A-1 pulley of the right ring and right middle finger. The pre, peri and post op course are discussed along with risks and complications.  She is aware there is no guarantee with surgery, possibility of infection, recurrence, injury to arteries, nerves and tendons, incomplete relief of symptoms and dystrophy.  This will be scheduled as an outpatient under regional anesthesia or general anesthesia as dictated by the anesthesia service.  [name] 10/21/2013, 5:33 AM

## 2013-10-21 NOTE — Anesthesia Procedure Notes (Signed)
Procedure Name: LMA Insertion Date/Time: 10/21/2013 8:45 AM Performed by: Lieutenant Diego Pre-anesthesia Checklist: Patient identified, Emergency Drugs available, Suction available and Patient being monitored Patient Re-evaluated:Patient Re-evaluated prior to inductionOxygen Delivery Method: Circle System Utilized Preoxygenation: Pre-oxygenation with 100% oxygen Intubation Type: IV induction Ventilation: Mask ventilation without difficulty LMA: LMA inserted LMA Size: 4.0 Number of attempts: 1 Airway Equipment and Method: bite block Placement Confirmation: positive ETCO2 and breath sounds checked- equal and bilateral Tube secured with: Tape Dental Injury: Teeth and Oropharynx as per pre-operative assessment

## 2013-10-21 NOTE — Discharge Instructions (Addendum)

## 2013-10-21 NOTE — Brief Op Note (Signed)
10/21/2013  9:51 AM  PATIENT:  [name]  80 y.o. female  PRE-OPERATIVE DIAGNOSIS:  BILATERAL CARPAL TUNNEL STENOSING TENOSYNOVITIS RIGHT RING RIGHT MIDDLE  POST-OPERATIVE DIAGNOSIS:  BILATERAL CARPAL TUNNEL STENOSING TENOSYNOVITIS  PROCEDURE:  Procedure(s): BILATERAL  CARPAL TUNNEL RELEASE (Bilateral) RELEASE TRIGGER FINGER/A-1 PULLEY RIGHT MIDDLE AND RIGHT RING (Right)  SURGEON:  Surgeon(s) and Role:    * [name], MD - Primary    * Tennis Must, MD  PHYSICIAN ASSISTANT:   ASSISTANTS: Richardo Priest, MD   ANESTHESIA:   local and general  EBL:  Total I/O In: 600 [I.V.:600] Out: -   BLOOD ADMINISTERED:none  DRAINS: none   LOCAL MEDICATIONS USED:  BUPIVICAINE   SPECIMEN:  No Specimen  DISPOSITION OF SPECIMEN:  N/A  COUNTS:  YES  TOURNIQUET:   Total Tourniquet Time Documented: Forearm (Right) - 34 minutes Total: Forearm (Right) - 34 minutes  area (Left) - 16 minutes Total: area (Left) - 16 minutes   DICTATION: .Other Dictation: Dictation Number [id number]  PLAN OF CARE: Discharge to home after PACU  PATIENT DISPOSITION:  PACU - hemodynamically stable.

## 2013-10-21 NOTE — Anesthesia Postprocedure Evaluation (Signed)
  Anesthesia Post-op Note  Patient: [name]  Procedure(s) Performed: Procedure(s): BILATERAL  CARPAL TUNNEL RELEASE (Bilateral) RELEASE TRIGGER FINGER/A-1 PULLEY RIGHT MIDDLE AND RIGHT RING (Right)  Patient Location: PACU  Anesthesia Type:General  Level of Consciousness: awake, alert  and oriented  Airway and Oxygen Therapy: Patient Spontanous Breathing  Post-op Pain: none  Post-op Assessment: Post-op Vital signs reviewed  Post-op Vital Signs: Reviewed  Last Vitals:  Filed Vitals:   10/21/13 1030  BP:   Pulse: 63  Temp:   Resp: 15    Complications: No apparent anesthesia complications

## 2013-10-22 ENCOUNTER — Encounter (HOSPITAL_BASED_OUTPATIENT_CLINIC_OR_DEPARTMENT_OTHER): Payer: Self-pay | Admitting: Orthopedic Surgery

## 2013-10-22 ENCOUNTER — Telehealth: Payer: Self-pay | Admitting: Internal Medicine

## 2013-10-22 NOTE — Telephone Encounter (Signed)
Per pt rqst r/s from 7/16 to 7/13 lab and MD

## 2013-11-04 ENCOUNTER — Other Ambulatory Visit: Payer: Self-pay | Admitting: Internal Medicine

## 2013-11-24 ENCOUNTER — Ambulatory Visit (HOSPITAL_BASED_OUTPATIENT_CLINIC_OR_DEPARTMENT_OTHER): Payer: Medicare Other | Admitting: Internal Medicine

## 2013-11-24 ENCOUNTER — Ambulatory Visit (HOSPITAL_BASED_OUTPATIENT_CLINIC_OR_DEPARTMENT_OTHER): Payer: Medicare Other

## 2013-11-24 ENCOUNTER — Telehealth: Payer: Self-pay | Admitting: Internal Medicine

## 2013-11-24 ENCOUNTER — Encounter: Payer: Self-pay | Admitting: Internal Medicine

## 2013-11-24 ENCOUNTER — Other Ambulatory Visit (HOSPITAL_BASED_OUTPATIENT_CLINIC_OR_DEPARTMENT_OTHER): Payer: Medicare Other

## 2013-11-24 ENCOUNTER — Other Ambulatory Visit: Payer: Self-pay | Admitting: Internal Medicine

## 2013-11-24 VITALS — BP 156/56 | HR 62 | Temp 98.8°F | Resp 19 | Ht 61.0 in | Wt 187.9 lb

## 2013-11-24 DIAGNOSIS — I129 Hypertensive chronic kidney disease with stage 1 through stage 4 chronic kidney disease, or unspecified chronic kidney disease: Secondary | ICD-10-CM

## 2013-11-24 DIAGNOSIS — R1310 Dysphagia, unspecified: Secondary | ICD-10-CM

## 2013-11-24 DIAGNOSIS — N189 Chronic kidney disease, unspecified: Secondary | ICD-10-CM

## 2013-11-24 DIAGNOSIS — D649 Anemia, unspecified: Secondary | ICD-10-CM

## 2013-11-24 DIAGNOSIS — K219 Gastro-esophageal reflux disease without esophagitis: Secondary | ICD-10-CM

## 2013-11-24 DIAGNOSIS — Z86718 Personal history of other venous thrombosis and embolism: Secondary | ICD-10-CM

## 2013-11-24 LAB — CBC WITH DIFFERENTIAL/PLATELET
BASO%: 0.2 % (ref 0.0–2.0)
Basophils Absolute: 0 10*3/uL (ref 0.0–0.1)
EOS%: 5 % (ref 0.0–7.0)
Eosinophils Absolute: 0.2 10*3/uL (ref 0.0–0.5)
HCT: 32 % — ABNORMAL LOW (ref 34.8–46.6)
HGB: 10.2 g/dL — ABNORMAL LOW (ref 11.6–15.9)
LYMPH%: 37.2 % (ref 14.0–49.7)
MCH: 27.6 pg (ref 25.1–34.0)
MCHC: 31.9 g/dL (ref 31.5–36.0)
MCV: 86.7 fL (ref 79.5–101.0)
MONO#: 0.3 10*3/uL (ref 0.1–0.9)
MONO%: 6.4 % (ref 0.0–14.0)
NEUT#: 2.2 10*3/uL (ref 1.5–6.5)
NEUT%: 51.2 % (ref 38.4–76.8)
Platelets: 195 10*3/uL (ref 145–400)
RBC: 3.69 10*6/uL — ABNORMAL LOW (ref 3.70–5.45)
RDW: 13.8 % (ref 11.2–14.5)
WBC: 4.2 10*3/uL (ref 3.9–10.3)
lymph#: 1.6 10*3/uL (ref 0.9–3.3)

## 2013-11-24 LAB — BASIC METABOLIC PANEL (CC13)
Anion Gap: 9 mEq/L (ref 3–11)
BUN: 31.9 mg/dL — ABNORMAL HIGH (ref 7.0–26.0)
CALCIUM: 9.9 mg/dL (ref 8.4–10.4)
CHLORIDE: 107 meq/L (ref 98–109)
CO2: 28 meq/L (ref 22–29)
Creatinine: 1.7 mg/dL — ABNORMAL HIGH (ref 0.6–1.1)
Glucose: 128 mg/dl (ref 70–140)
Potassium: 4 mEq/L (ref 3.5–5.1)
Sodium: 144 mEq/L (ref 136–145)

## 2013-11-24 LAB — IRON AND TIBC CHCC
%SAT: 19 % — ABNORMAL LOW (ref 21–57)
IRON: 64 ug/dL (ref 41–142)
TIBC: 344 ug/dL (ref 236–444)
UIBC: 280 ug/dL (ref 120–384)

## 2013-11-24 LAB — FERRITIN CHCC: FERRITIN: 93 ng/mL (ref 9–269)

## 2013-11-24 NOTE — Telephone Encounter (Signed)
gv adn rpitned aptp sched and avs for pt for OCT....sent pt to lab

## 2013-11-24 NOTE — Progress Notes (Signed)
[facility] OFFICE PROGRESS NOTE  [name], MD [place] [address]  DIAGNOSIS: Anemia, unspecified anemia type - Plan: CBC with Differential, Comprehensive metabolic panel (Cmet) - [facility], Ferritin, Iron and TIBC [facility]  Chief Complaint  Patient presents with  . Anemia    CURRENT THERAPY: Ferrous sulfate 325 mg daily with orange juice.   INTERVAL HISTORY: [name] 80 y.o. female who is has a history of gastric esophageal reflux disease and anemia presents for further evaluation and management.  A primary care doctor (Dr. Regis Bill) referred the patient for anemia. On 11/25/2012 her hemoglobin was 11.2, on 01/21/2013 it was 10.6. She presents for follow-up with her grand daughter [name].   She was last seen by nurse practitioner Willia Craze due to dysphagia and had an EGD and colonoscopy on 03/19/2013 by Dr. Olevia Perches. She had the colonoscopy revealing mild diverticulosis and EGD revealing a mild distal esophageal stricture.    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 She lives with her granddaughter.  She complains of intermittent shoulder pain but otherwise she reports doing pretty well. Of note she's had left shoulder pain which has been evaluated in the past. She had bilateral carpal tunnel syndrome  stenosing tenosynovitis s/p release of median nerve by Dr. Fredna Dow on 10/21/2013. She was last seen by Dr. Regis Bill on 08/18/2013 with changes to alpraz to lorazepam.   MEDICAL HISTORY: Past Medical History  Diagnosis Date  . Asthma     " onset when young"   . History of DVT (deep vein thrombosis)     "RLE" (09/09/2012) coumadine cant take asa so on plavix   . Allergy   . GERD (gastroesophageal reflux disease)   . Hyperlipidemia   . Hypertension   . Varicose veins   . Lupus     "cured years ago" (09/09/2012)  . Headache(784.0)     "related to my high blood pressure" (09/09/2012)  . Arthritis     "shoulders"  (09/09/2012)  . Anxiety   . Anemia     INTERIM HISTORY: has Varicose veins of leg with complications; Chest pain, atypical; GERD (gastroesophageal reflux disease); Depression; HTN (hypertension); Hyperlipidemia; Asthma; Family hx of colon cancer; Family hx of lung cancer; Family hx-breast malignancy; Renal insufficiency creatinine 1.3; Anemia; Back pain; Headache(784.0); Anxiety state, unspecified; Leg cramps; Other dysphagia; Colon cancer screening; Bereavement due to life event; Leg edema; and Death of family member on her problem list.    ALLERGIES:  is allergic to aspirin and penicillins.  MEDICATIONS: has a current medication list which includes the following prescription(s): albuterol, albuterol, clopidogrel, fenofibrate, ferrous sulfate, fluticasone-salmeterol, furosemide, hydrocodone-acetaminophen, lorazepam, metoprolol succinate, montelukast, pantoprazole, potassium chloride, proair hfa, tribenzor, and vitamin b-12.  SURGICAL HISTORY:  Past Surgical History  Procedure Laterality Date  . Abdominal hysterectomy      partial  . Breast surgery    . Carpal tunnel release Right 2000's  . Shoulder open rotator cuff repair Bilateral 2000's  . Carpal tunnel release Bilateral 10/21/2013    Procedure: BILATERAL  CARPAL TUNNEL RELEASE;  Surgeon: [name], MD;  Location: [facility];  Service: Orthopedics;  Laterality: Bilateral;  . Trigger finger release Right 10/21/2013    Procedure: RELEASE TRIGGER FINGER/A-1 PULLEY RIGHT MIDDLE AND RIGHT RING;  Surgeon: [name], MD;  Location: [facility];  Service: Orthopedics;  Laterality: Right;    REVIEW OF SYSTEMS:   Constitutional: Denies fevers, chills  or abnormal weight loss Eyes: Denies blurriness of vision Ears, nose, mouth, throat, and face: Denies mucositis or sore throat Respiratory: Denies cough, dyspnea or wheezes Cardiovascular: Denies palpitation, chest discomfort or lower extremity  swelling Gastrointestinal:  Denies nausea, heartburn or change in bowel habits Skin: Denies abnormal skin rashes Lymphatics: Denies new lymphadenopathy or easy bruising Neurological:Denies numbness, tingling or new weaknesses Behavioral/Psych: Mood is stable, no new changes  All other systems were reviewed with the patient and are negative.  PHYSICAL EXAMINATION: ECOG PERFORMANCE STATUS: 1 - Symptomatic but completely ambulatory  Blood pressure 156/56, pulse 62, temperature 98.8 F (37.1 C), temperature source Oral, resp. rate 19, height 5' 1" (1.549 m), weight 187 lb 14.4 oz (85.231 kg), SpO2 99.00%.  GENERAL:alert, no distress and comfortable; elderly female who appears her stated age SKIN: skin color, texture, turgor are normal, no rashes or significant lesions EYES: normal, Conjunctiva are pink and non-injected, sclera clear OROPHARYNX:no exudate, no erythema and lips, buccal mucosa, and tongue normal  NECK: supple, thyroid normal size, non-tender, without nodularity LYMPH:  no palpable lymphadenopathy in the cervical, axillary or supraclavicular LUNGS: clear to auscultation with normal breathing effort HEART: regular rate & rhythm and no murmurs and 1+ lower extremity edema with chronic venous changes ABDOMEN:abdomen soft, non-tender and normal bowel sounds Musculoskeletal:no cyanosis of digits and no clubbing  NEURO: alert & oriented x 3 with fluent speech, no focal motor/sensory deficits   LABORATORY DATA:  Labs:  Lab Results  Component Value Date   WBC 4.2 11/24/2013   HGB 10.2* 11/24/2013   HCT 32.0* 11/24/2013   MCV 86.7 11/24/2013   PLT 195 11/24/2013   NEUTROABS 2.2 11/24/2013      Chemistry      Component Value Date/Time   NA 145 10/16/2013 1130   NA 144 08/22/2013 1016   K 3.9 10/16/2013 1130   K 3.8 08/22/2013 1016   CL 106 10/16/2013 1130   CO2 28 10/16/2013 1130   CO2 24 08/22/2013 1016   BUN 28* 10/16/2013 1130   BUN 35.6* 08/22/2013 1016   CREATININE 1.50* 10/16/2013  1130   CREATININE 1.8* 08/22/2013 1016      Component Value Date/Time   CALCIUM 10.0 10/16/2013 1130   CALCIUM 9.9 08/22/2013 1016   ALKPHOS 44 08/22/2013 1016   AST 17 08/22/2013 1016   ALT <6 08/22/2013 1016   BILITOT 0.26 08/22/2013 1016      Basic Metabolic Panel: No results found for this basename: NA, K, CL, CO2, GLUCOSE, BUN, CREATININE, CALCIUM, MG, PHOS,  in the last 168 hours GFR Estimated Creatinine Clearance: 31.1 ml/min (by C-G formula based on Cr of 1.5). Liver Function Tests: No results found for this basename: AST, ALT, ALKPHOS, BILITOT, PROT, ALBUMIN,  in the last 168 hours CBC:  Recent Labs Lab 11/24/13 1349  WBC 4.2  NEUTROABS 2.2  HGB 10.2*  HCT 32.0*  MCV 86.7  PLT 195   Anemia work up No results found for this basename: VITAMINB12, FOLATE, FERRITIN, TIBC, IRON, RETICCTPCT,  in the last 72 hours Microbiology No results found for this or any previous visit (from the past 240 hour(s)).  Studies:  No results found.   RADIOGRAPHIC STUDIES: No results found.  ASSESSMENT: [name] 80 y.o. female with a history of Anemia, unspecified anemia type - Plan: CBC with Differential, Comprehensive metabolic panel (Cmet) - [facility], Ferritin, Iron and TIBC [facility]   PLAN:  1. Anemia likely anemia of chronic disease/kidney disease.  --  Her hemoglobin remains mildly decreased at 10.2 down from 10.8 down from 11.5 on last visit.  Her hemoglobin ranged from 10.6-11.5 over the last several months. Her white blood cell count is normal with 4.2 and platelets are normal at 2195. Her MCV for her complete blood count is 86.7 with a normal RDW. This is indication of likely anemia of chronic disease. Her blood smear revealed a normocytic anemia. We will await her repeat ferritin and iron studies.   --Her screening SPEP was within normal limits. Both her ESR and CRP was within normal limits. Her TSH screening was negative. Her folate level in April of this year was within normal  limits. She is on vitamin B12 with her last level of 1620 in April. Her anemia is likely secondary to her moderate chronic renal disease. If her hemoglobin is over 11, there is no indications for erythropoietin. We can continue to monitor closely and treat her chronic renal disease.   2. Chronic Renal Disease, mild.  --Her creatinine is 1.5 in 10/2013 with the remaining of her CMP unremarkable.Her creatinine clearance is 36.3 mL/min.  Her creatinine ranged from 1.2-1.5 all the last few months. Patient has been counseled to avoid nephrotoxins and to continue adequate hydration. Patient should continue with tight glycemic control and maintain a good blood pressure to decrease the chance further decline in renal function.   3. GERD/dysphagia.  --Patient had an EGD and a colonoscopy on 03/19/2013 by Dr. Olevia Perches. With iron index and her ferritin within normal limits, this is less likely to be resulting in her mild anemia. Of note she has a first degree relative with a history of colon cancer. It is as noted in HPI.   4. History of DVT.  --Patient was informed that patients with a history of DVT or more prone to DVT recurrence. He denies any symptoms of swelling in the legs and she reports mobilization. He also denies any acute shortness of breath.   5. Hypertension  -Continue low salt diet and anti-hypertensive medications per PCP.   6. Follow-up  --She will require a repeat CBC and chemistries in 6 weeks and follow up in 3 months.   All questions were answered. The patient knows to call the clinic with any problems, questions or concerns. We can certainly see the patient much sooner if necessary.  I spent 10 minutes counseling the patient face to face. The total time spent in the appointment was 15 minutes.    , , MD 11/24/2013 2:19 PM

## 2013-11-27 ENCOUNTER — Ambulatory Visit: Payer: Medicare Other

## 2013-11-27 ENCOUNTER — Other Ambulatory Visit: Payer: Medicare Other

## 2013-12-04 ENCOUNTER — Other Ambulatory Visit: Payer: Self-pay | Admitting: Internal Medicine

## 2013-12-05 ENCOUNTER — Other Ambulatory Visit: Payer: Self-pay | Admitting: Internal Medicine

## 2013-12-05 NOTE — Telephone Encounter (Signed)
Sent to the pharmacy by e-scribe.  BMP on 11/24/13.  Normal potassium.  Filled for 1 year.

## 2013-12-08 NOTE — Telephone Encounter (Signed)
Ok to refill x 6 months 

## 2013-12-08 NOTE — Telephone Encounter (Signed)
Sent to the pharmacy by e-scribe. 

## 2013-12-19 ENCOUNTER — Encounter: Payer: Self-pay | Admitting: Internal Medicine

## 2013-12-19 ENCOUNTER — Encounter: Payer: Medicare Other | Admitting: Internal Medicine

## 2013-12-19 DIAGNOSIS — D638 Anemia in other chronic diseases classified elsewhere: Secondary | ICD-10-CM | POA: Insufficient documentation

## 2013-12-19 NOTE — Progress Notes (Signed)
Document opened and reviewed for OV but appt  No show same day .

## 2013-12-24 ENCOUNTER — Telehealth: Payer: Self-pay | Admitting: Internal Medicine

## 2013-12-24 MED ORDER — LORAZEPAM 0.5 MG PO TABS: 0.2500 mg | ORAL_TABLET | Freq: Three times a day (TID) | ORAL | Status: DC | PRN

## 2013-12-24 NOTE — Telephone Encounter (Signed)
[facility], [address] Is requesting re-fill on LORazepam (ATIVAN) 0.5 MG tablet

## 2013-12-24 NOTE — Telephone Encounter (Signed)
Called to the pharmacy and left on voicemail. 

## 2013-12-24 NOTE — Telephone Encounter (Signed)
Last filled on 08/18/2013 #30 with 1 additional refill Last seen on 12/19/13 and has upcoming on 01/05/14 Please advise.  Thanks!

## 2013-12-24 NOTE — Telephone Encounter (Signed)
Call in #30 with no rf  

## 2014-01-05 ENCOUNTER — Encounter: Payer: Self-pay | Admitting: Internal Medicine

## 2014-01-05 ENCOUNTER — Ambulatory Visit (INDEPENDENT_AMBULATORY_CARE_PROVIDER_SITE_OTHER): Payer: Medicare Other | Admitting: Internal Medicine

## 2014-01-05 VITALS — BP 132/68 | HR 71 | Temp 98.8°F | Ht 61.0 in | Wt 181.0 lb

## 2014-01-05 DIAGNOSIS — F411 Generalized anxiety disorder: Secondary | ICD-10-CM

## 2014-01-05 DIAGNOSIS — M25572 Pain in left ankle and joints of left foot: Secondary | ICD-10-CM

## 2014-01-05 DIAGNOSIS — N289 Disorder of kidney and ureter, unspecified: Secondary | ICD-10-CM

## 2014-01-05 DIAGNOSIS — R002 Palpitations: Secondary | ICD-10-CM

## 2014-01-05 DIAGNOSIS — M25579 Pain in unspecified ankle and joints of unspecified foot: Secondary | ICD-10-CM

## 2014-01-05 DIAGNOSIS — I10 Essential (primary) hypertension: Secondary | ICD-10-CM

## 2014-01-05 HISTORY — DX: Palpitations: R00.2

## 2014-01-05 MED ORDER — ALBUTEROL SULFATE (2.5 MG/3ML) 0.083% IN NEBU: 2.5000 mg | INHALATION_SOLUTION | Freq: Four times a day (QID) | RESPIRATORY_TRACT | Status: DC | PRN

## 2014-01-05 MED ORDER — PREDNISONE 10 MG PO TABS: ORAL_TABLET | ORAL | Status: DC

## 2014-01-05 NOTE — Progress Notes (Signed)
Chief Complaint  Patient presents with  . Follow-up    HPI: [name] In for followup of a number of medical issues.  She did have her bilateral carpal tunnel surgery. It is still a bit sore. Ht: Taking medication readings have been fairly good at home. Anemia seeing the hematologist mild iron deficiency possibly secondary to chronic disease they want her to come back in the fall New problem having problems with foot pain that she feels may have been gout a while back that finally went away on its own. HoweverCame back about 2 weeks ago left ankle and foot pain At night  Hurst most  Time head ed b Covers  hurt when he touches it.   not that red.  Remote hx of gout. Tried  tylenol  has been having an occasional Skipped beat .  At times  not really says he was shortness of breath but has had some chest tightness a while back for which she used albuterol. Would like a refill on her nebulizer just in case.   ROS: See pertinent positives and negatives per HPI. No fever sycope new swelling has vv and leg cramps   Past Medical History  Diagnosis Date  . Asthma     " onset when young"   . History of DVT (deep vein thrombosis)     "RLE" (09/09/2012) coumadine cant take asa so on plavix   . Allergy   . GERD (gastroesophageal reflux disease)   . Hyperlipidemia   . Hypertension   . Varicose veins   . Lupus     "cured years ago" (09/09/2012)  . Headache(784.0)     "related to my high blood pressure" (09/09/2012)  . Arthritis     "shoulders" (09/09/2012)  . Anxiety   . Anemia     Family History  Problem Relation Age of Onset  . Hypertension Mother   . Hypertension Father   . Cancer Sister     Colon and Breast Cancer  . Hypertension Brother   . Heart attack Daughter   . Lung cancer      both parents     History   Social History  . Marital Status: Single    Spouse Name: N/A    Number of Children: N/A  . Years of Education: N/A   Social History Main Topics  . Smoking  status: Never Smoker   . Smokeless tobacco: Never Used  . Alcohol Use: No  . Drug Use: No  . Sexual Activity: No   Other Topics Concern  . None   Social History Narrative   2 people living in the home.  Grand daughter.   Up and down through the night      Had 7 children  2 deceased . 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    works 3 days per week currently .   Glasses dentures  Neg tad     Outpatient Encounter Prescriptions as of 01/05/2014  Medication Sig  . ADVAIR DISKUS 250-50 MCG/DOSE AEPB USE 1 PUFF TWICE DAILY.  [name] albuterol (PROVENTIL HFA;VENTOLIN HFA) 108 (90 BASE) MCG/ACT inhaler Inhale 2 puffs into the lungs every 6 (six) hours as needed for wheezing. For wheezing  . albuterol (PROVENTIL) (2.5 MG/3ML) 0.083% nebulizer solution Take 3 mLs (2.5 mg total) by nebulization every 6 (six) hours as needed. For wheezing  . clopidogrel (PLAVIX) 75  MG tablet Take 1 tablet (75 mg total) by mouth daily.  . fenofibrate 160 MG tablet TAKE 1 TABLET ONCE DAILY.  . ferrous sulfate 325 (65 FE) MG EC tablet Take 1 tablet (325 mg total) by mouth daily with breakfast.  . furosemide (LASIX) 20 MG tablet TAKE 1 TABLET EACH DAY.  . LORazepam (ATIVAN) 0.5 MG tablet Take 0.5-1 tablets (0.25-0.5 mg total) by mouth every 8 (eight) hours as needed for anxiety or sleep.  . metoprolol succinate (TOPROL-XL) 50 MG 24 hr tablet Take 1 tablet (50 mg total) by mouth daily. Take with or immediately following a meal.  . montelukast (SINGULAIR) 10 MG tablet TAKE 1 TABLET ONCE DAILY.  . pantoprazole (PROTONIX) 40 MG tablet TAKE 1 TABLET ONCE DAILY.  [name] potassium chloride (K-DUR) 10 MEQ tablet TAKE (2) TABLETS DAILY.  [name] PROAIR HFA 108 (90 BASE) MCG/ACT inhaler USE 2 PUFFS EVERY 6 HOURS AS NEEDED FOR WHEEZING.  Jabier Gauss 40-10-25 MG TABS TAKE 1 TABLET ONCE DAILY.  . vitamin B-12 (CYANOCOBALAMIN) 1000 MCG tablet Take 1,000 mcg by mouth  daily.  . [DISCONTINUED] albuterol (PROVENTIL) (2.5 MG/3ML) 0.083% nebulizer solution Take 2.5 mg by nebulization every 6 (six) hours as needed. For wheezing  . predniSONE (DELTASONE) 10 MG tablet Take pills per day,6,6,6,4,4,4,2,2,2,1,1,1  . [DISCONTINUED] HYDROcodone-acetaminophen (NORCO) 5-325 MG per tablet Take 1 tablet by mouth every 6 (six) hours as needed for moderate pain.    EXAM:  BP 132/68  Pulse 71  Temp(Src) 98.8 F (37.1 C) (Oral)  Ht 5\' 1"  (1.549 m)  Wt 181 lb (82.101 kg)  BMI 34.22 kg/m2  Body mass index is 34.22 kg/(m^2).  GENERAL: vitals reviewed and listed above, alert, oriented, appears well hydrated and in no acute distress HEENT: atraumatic, conjunctiva  clear, no obvious abnormalities on inspection of external nose and ears OP : no lesion edema or exudate  NECK: no obvious masses on inspection palpation  LUNGS: clear to auscultation bilaterally, no wheezes, rales or rhonchi, good air movement CV: HRRR, no clubbing cyanosis nl cap refill  MS: moves all extremities  Le left ankle 2+ swollen and bilateral VV no redness or warmth  Ir skin change  PSYCH: pleasant and cooperative, no obvious depression or anxiety Lab Results  Component Value Date   WBC 4.2 11/24/2013   HGB 10.2* 11/24/2013   HCT 32.0* 11/24/2013   PLT 195 11/24/2013   GLUCOSE 128 11/24/2013   CHOL 171 09/09/2012   TRIG 148 09/09/2012   HDL 47 09/09/2012   LDLCALC 94 09/09/2012   ALT <6 08/22/2013   AST 17 08/22/2013   NA 144 11/24/2013   K 4.0 11/24/2013   CL 106 10/16/2013   CREATININE 1.7* 11/24/2013   BUN 31.9* 11/24/2013   CO2 28 11/24/2013   TSH 1.786 09/09/2012   INR 0.94 12/22/2009    ASSESSMENT AND PLAN:  Discussed the following assessment and plan:  Essential hypertension - better  Renal insufficiency  - cr up to 1.7 over this year  from 1.3 baseline   Anxiety state, unspecified  Palpitations - sound anxiety but has many risk factors  check  tsh at next lab draw  and refer for  eval  Ankle pain, left - pt feels from gout empiric pred  woul not advise nsaids  - Plan: DG Ankle Complete Left Ok to refill albuterol with caution -Patient advised to return or notify health care team  if symptoms worsen ,persist or new concerns arise.  Patient Instructions  Get x ray of the left ankle   We can treat for gout .   With prednisone   To see if helps the pain.  Can get cardiology   To see you about the palpitations. . someone will call you about this appointment Also kidney function has slowly worsened over the last year.   This could be contributing also to your anemia.  Plan check kidney ultrasound at some point if not already done.  If progressing we may get the kidney doctor to see you for controlling her blood pressure is important and not taking any Advil Aleve-type products.  Leg cramps can be from a number of things including medications and varicose veins  Plan blood tests BMP uric acid  and urine test for urine protein creatinine in about 3-4 weeks.  Followup depending on results.    Standley [place]. Jaryah Aracena M.D.

## 2014-01-05 NOTE — Progress Notes (Signed)
Pre visit review using our clinic review tool, if applicable. No additional management support is needed unless otherwise documented below in the visit note. 

## 2014-01-05 NOTE — Patient Instructions (Addendum)
Get x ray of the left ankle   We can treat for gout .   With prednisone   To see if helps the pain.  Can get cardiology   To see you about the palpitations. . someone will call you about this appointment Also kidney function has slowly worsened over the last year.   This could be contributing also to your anemia.  Plan check kidney ultrasound at some point if not already done.  If progressing we may get the kidney doctor to see you for controlling her blood pressure is important and not taking any Advil Aleve-type products.  Leg cramps can be from a number of things including medications and varicose veins  Plan blood tests BMP uric acid  and urine test for urine protein creatinine in about 3-4 weeks.  Followup depending on results.

## 2014-01-06 ENCOUNTER — Ambulatory Visit (INDEPENDENT_AMBULATORY_CARE_PROVIDER_SITE_OTHER)
Admission: RE | Admit: 2014-01-06 | Discharge: 2014-01-06 | Disposition: A | Discharge status: Home / Self Care | Payer: Medicare Other | Source: Ambulatory Visit | Attending: Internal Medicine | Admitting: Internal Medicine

## 2014-01-06 DIAGNOSIS — M25579 Pain in unspecified ankle and joints of unspecified foot: Secondary | ICD-10-CM

## 2014-01-06 DIAGNOSIS — M25572 Pain in left ankle and joints of left foot: Secondary | ICD-10-CM

## 2014-01-21 ENCOUNTER — Other Ambulatory Visit: Payer: Self-pay | Admitting: Internal Medicine

## 2014-01-21 NOTE — Telephone Encounter (Signed)
Sent to the pharmacy by e-scribe. 

## 2014-01-22 ENCOUNTER — Other Ambulatory Visit: Payer: Self-pay | Admitting: Internal Medicine

## 2014-01-22 NOTE — Telephone Encounter (Signed)
Sent to the pharmacy by e-scribe. 

## 2014-01-26 ENCOUNTER — Other Ambulatory Visit (INDEPENDENT_AMBULATORY_CARE_PROVIDER_SITE_OTHER): Payer: Medicare Other

## 2014-01-26 DIAGNOSIS — M109 Gout, unspecified: Secondary | ICD-10-CM

## 2014-01-26 DIAGNOSIS — I10 Essential (primary) hypertension: Secondary | ICD-10-CM

## 2014-01-26 LAB — BASIC METABOLIC PANEL
BUN: 31 mg/dL — ABNORMAL HIGH (ref 6–23)
CALCIUM: 9.2 mg/dL (ref 8.4–10.5)
CO2: 27 meq/L (ref 19–32)
CREATININE: 1.6 mg/dL — AB (ref 0.4–1.2)
Chloride: 103 mEq/L (ref 96–112)
GFR: 40.38 mL/min — AB (ref >60.00)
GLUCOSE: 145 mg/dL — AB (ref 70–99)
Potassium: 3 mEq/L — ABNORMAL LOW (ref 3.5–5.1)
Sodium: 141 mEq/L (ref 135–145)

## 2014-01-26 LAB — URIC ACID: Uric Acid, Serum: 9.5 mg/dL — ABNORMAL HIGH (ref 2.4–7.0)

## 2014-01-27 LAB — PROTEIN / CREATININE RATIO, URINE: Creatinine, Urine: 21.7 mg/dL

## 2014-01-30 ENCOUNTER — Other Ambulatory Visit: Payer: Self-pay | Admitting: Family Medicine

## 2014-01-30 MED ORDER — POTASSIUM CHLORIDE CRYS ER 10 MEQ PO TBCR: EXTENDED_RELEASE_TABLET | ORAL | Status: DC

## 2014-02-02 ENCOUNTER — Other Ambulatory Visit: Payer: Self-pay | Admitting: Internal Medicine

## 2014-02-10 ENCOUNTER — Encounter: Payer: Self-pay | Admitting: Cardiovascular Disease

## 2014-02-10 ENCOUNTER — Ambulatory Visit (INDEPENDENT_AMBULATORY_CARE_PROVIDER_SITE_OTHER): Payer: Medicare Other | Admitting: Cardiovascular Disease

## 2014-02-10 VITALS — BP 160/80 | HR 67 | Ht 61.0 in | Wt 194.4 lb

## 2014-02-10 DIAGNOSIS — I10 Essential (primary) hypertension: Secondary | ICD-10-CM

## 2014-02-10 DIAGNOSIS — R609 Edema, unspecified: Secondary | ICD-10-CM

## 2014-02-10 DIAGNOSIS — R600 Localized edema: Secondary | ICD-10-CM

## 2014-02-10 MED ORDER — DOXAZOSIN MESYLATE 2 MG PO TABS: ORAL_TABLET | ORAL | Status: DC

## 2014-02-10 NOTE — Patient Instructions (Addendum)
  I recommend that you elevate your legs .   Go to [email or web address]      Your physician has recommended you make the following change in your medication:  START Cardura 2 mg once daily in the evening  Follow up with Dr. Regis Bill as needed

## 2014-02-10 NOTE — Progress Notes (Signed)
[name] Date of Birth  Jul 13, 1933       [facility] [address], [place], [place]  [address]   [place], [place]  [address] [place]   Fax  [phone]     Fax [phone]  Problem List: 1. Palpitations 2. Asthma 3. HTN   History of Present Illness:  [name] is a 80 yo who is referred for palpitations.  These seem to be associated with prednisone therapy ( which she was taking for arthritis in her feet)  She has not had any recently .  Has not had any episodes of palpitations.    She has rare episodes of DOE.  She relates the dyspnea with walking in her house.   She walks on the treadmill on occasion.  ? Occasional episodes of chest pain.   BP is a bit elevated.  Tries to avoid salt.     Current Outpatient Prescriptions on File Prior to Visit  Medication Sig Dispense Refill  . ADVAIR DISKUS 250-50 MCG/DOSE AEPB USE 1 PUFF TWICE DAILY.  60 each  5  . albuterol (PROVENTIL) (2.5 MG/3ML) 0.083% nebulizer solution Take 3 mLs (2.5 mg total) by nebulization every 6 (six) hours as needed. For wheezing  75 mL  1  . clopidogrel (PLAVIX) 75 MG tablet Take 1 tablet (75 mg total) by mouth daily.  30 tablet  5  . fenofibrate 160 MG tablet TAKE 1 TABLET ONCE DAILY.  90 tablet  0  . ferrous sulfate 325 (65 FE) MG EC tablet Take 1 tablet (325 mg total) by mouth daily with breakfast.  30 tablet  3  . ferrous sulfate 325 (65 FE) MG tablet TAKE ONE TABLET ONCE A DAY WITH BREAKFAST.  100 tablet  1  . furosemide (LASIX) 20 MG tablet TAKE 1 TABLET EACH DAY.  30 tablet  11  . LORazepam (ATIVAN) 0.5 MG tablet Take 0.5-1 tablets (0.25-0.5 mg total) by mouth every 8 (eight) hours as needed for anxiety or sleep.  30 tablet  0  . metoprolol succinate (TOPROL-XL) 50 MG 24 hr tablet TAKE 1 TABLET ONCE DAILY WITH FOOD.  30 tablet  2  . montelukast (SINGULAIR) 10 MG tablet TAKE 1 TABLET ONCE DAILY.  90 tablet   0  . pantoprazole (PROTONIX) 40 MG tablet TAKE 1 TABLET ONCE DAILY.  30 tablet  0  . potassium chloride (K-DUR) 10 MEQ tablet TAKE (2) TABLETS DAILY.  180 tablet  1  . potassium chloride (K-DUR,KLOR-CON) 10 MEQ tablet Take 4 tablets daily for 14 days.  56 tablet  0  . predniSONE (DELTASONE) 10 MG tablet Take pills per day,6,6,6,4,4,4,2,2,2,1,1,1  40 tablet  0  . TRIBENZOR 40-10-25 MG TABS TAKE 1 TABLET ONCE DAILY.  30 tablet  0  . vitamin B-12 (CYANOCOBALAMIN) 1000 MCG tablet Take 1,000 mcg by mouth daily.       No current facility-administered medications on file prior to visit.    Allergies  Allergen Reactions  . Aspirin Other (See Comments)    wheezing  . Penicillins Nausea And Vomiting and Other (See Comments)    Wheezing    Past Medical History  Diagnosis Date  . Asthma     " onset when young"   . History of DVT (deep vein thrombosis)     "RLE" (09/09/2012) coumadine cant take asa so on plavix   . Allergy   .  GERD (gastroesophageal reflux disease)   . Hyperlipidemia   . Hypertension   . Varicose veins   . Lupus     "cured years ago" (09/09/2012)  . Headache(784.0)     "related to my high blood pressure" (09/09/2012)  . Arthritis     "shoulders" (09/09/2012)  . Anxiety   . Anemia     Past Surgical History  Procedure Laterality Date  . Abdominal hysterectomy      partial  . Breast surgery    . Carpal tunnel release Right 2000's  . Shoulder open rotator cuff repair Bilateral 2000's  . Carpal tunnel release Bilateral 10/21/2013    Procedure: BILATERAL  CARPAL TUNNEL RELEASE;  Surgeon: [name], MD;  Location: [facility];  Service: Orthopedics;  Laterality: Bilateral;  . Trigger finger release Right 10/21/2013    Procedure: RELEASE TRIGGER FINGER/A-1 PULLEY RIGHT MIDDLE AND RIGHT RING;  Surgeon: [name], MD;  Location: [facility];  Service: Orthopedics;  Laterality: Right;    History  Smoking status  . Never Smoker   Smokeless  tobacco  . Never Used    History  Alcohol Use No    Family History  Problem Relation Age of Onset  . Hypertension Mother   . Hypertension Father   . Cancer Sister     Colon and Breast Cancer  . Hypertension Brother   . Heart attack Daughter   . Lung cancer      both parents     Reviw of Systems:  Reviewed in the HPI.  All other systems are negative.  Physical Exam: Blood pressure 160/80, pulse 67, height 5\' 1"  (1.549 m), weight 194 lb 6.4 oz (88.179 kg). Wt Readings from Last 3 Encounters:  02/10/14 194 lb 6.4 oz (88.179 kg)  01/05/14 181 lb (82.101 kg)  11/24/13 187 lb 14.4 oz (85.231 kg)     General: Well developed, well nourished, in no acute distress.  Head: Normocephalic, atraumatic, sclera non-icteric, mucus membranes are moist,   Neck: Supple. Carotids are 2 + without bruits. No JVD   Lungs: Clear   Heart: RR, soft systolic murmur  Abdomen: Soft, non-tender, non-distended with normal bowel sounds.  Msk:  Strength and tone are normal   Extremities: No clubbing or cyanosis. 1+ edema.  Distal pedal pulses are 2+ and equal    Neuro: CN II - XII intact.  Alert and oriented X 3.   Psych:  Normal   ECG: 10/16/13:  NSR, no St or T wave changes.   Assessment / Plan:

## 2014-02-10 NOTE — Assessment & Plan Note (Signed)
Her Bp is elevated today.  Will add Cardura 2 mg a day.  This can be titrated up to 8 mg a day if needed. Further evaluation and management per Dr. Regis Bill.

## 2014-02-10 NOTE — Assessment & Plan Note (Signed)
[name] is not having any further episodes of palpitations. I suspect these were due to her prednisone therapy. Her potassium level was noted to be low and it has already been replaced. He is in normal rhythm today. We will have her followup with Dr. Regis Bill.

## 2014-02-10 NOTE — Assessment & Plan Note (Signed)
She's on high-dose amlodipine. This is likely contributing to her leg edema. She has normal left ventricle systolic function by echo as of last year. Stressed to her that the important treatments for leg edema including leg elevation, compression hose, and ambulation. I have encouraged her to start a walking program to walk on a daily basis.  She has seen the vascular surgeons at [facility] and was not found to have any episodes of DVT.  She was previously on Coumadin.  For some reason the Coumadin was stopped and she was placed on Plavix. She's never had a stent. In my opinion, I do not think that she needs Plavix therapy if this was started in place of Coumadin.   There was no evidence of DVT by venous duplex in 2012 and she was not re-started on coumadin at that time.  I will leave the decision of whether or not to continue Plavix up to Dr. Regis Bill.

## 2014-02-12 ENCOUNTER — Other Ambulatory Visit (INDEPENDENT_AMBULATORY_CARE_PROVIDER_SITE_OTHER): Payer: Medicare Other

## 2014-02-12 DIAGNOSIS — I10 Essential (primary) hypertension: Secondary | ICD-10-CM

## 2014-02-12 LAB — BASIC METABOLIC PANEL
BUN: 27 mg/dL — AB (ref 6–23)
CALCIUM: 9.6 mg/dL (ref 8.4–10.5)
CO2: 31 mEq/L (ref 19–32)
Chloride: 103 mEq/L (ref 96–112)
Creatinine, Ser: 1.7 mg/dL — ABNORMAL HIGH (ref 0.4–1.2)
GFR: 37.12 mL/min — AB (ref >60.00)
Glucose, Bld: 144 mg/dL — ABNORMAL HIGH (ref 70–99)
Potassium: 4 mEq/L (ref 3.5–5.1)
Sodium: 140 mEq/L (ref 135–145)

## 2014-02-12 LAB — MAGNESIUM: Magnesium: 1.9 mg/dL (ref 1.5–2.5)

## 2014-02-16 ENCOUNTER — Telehealth: Payer: Self-pay | Admitting: Hematology

## 2014-02-16 ENCOUNTER — Ambulatory Visit (HOSPITAL_BASED_OUTPATIENT_CLINIC_OR_DEPARTMENT_OTHER): Payer: Medicare Other | Admitting: Hematology

## 2014-02-16 ENCOUNTER — Other Ambulatory Visit (HOSPITAL_BASED_OUTPATIENT_CLINIC_OR_DEPARTMENT_OTHER): Payer: Medicare Other

## 2014-02-16 ENCOUNTER — Encounter: Payer: Self-pay | Admitting: Hematology

## 2014-02-16 VITALS — BP 141/59 | HR 65 | Temp 98.4°F | Resp 18 | Ht 61.0 in | Wt 195.9 lb

## 2014-02-16 DIAGNOSIS — Z86718 Personal history of other venous thrombosis and embolism: Secondary | ICD-10-CM

## 2014-02-16 DIAGNOSIS — N183 Chronic kidney disease, stage 3 (moderate): Secondary | ICD-10-CM

## 2014-02-16 DIAGNOSIS — N189 Chronic kidney disease, unspecified: Secondary | ICD-10-CM

## 2014-02-16 DIAGNOSIS — D509 Iron deficiency anemia, unspecified: Secondary | ICD-10-CM

## 2014-02-16 DIAGNOSIS — D649 Anemia, unspecified: Secondary | ICD-10-CM

## 2014-02-16 DIAGNOSIS — D631 Anemia in chronic kidney disease: Secondary | ICD-10-CM

## 2014-02-16 LAB — CBC WITH DIFFERENTIAL/PLATELET
BASO%: 1.3 % (ref 0.0–2.0)
Basophils Absolute: 0.1 10*3/uL (ref 0.0–0.1)
EOS%: 2 % (ref 0.0–7.0)
Eosinophils Absolute: 0.1 10*3/uL (ref 0.0–0.5)
HCT: 32.6 % — ABNORMAL LOW (ref 34.8–46.6)
HGB: 10.5 g/dL — ABNORMAL LOW (ref 11.6–15.9)
LYMPH#: 1.7 10*3/uL (ref 0.9–3.3)
LYMPH%: 37.2 % (ref 14.0–49.7)
MCH: 27.9 pg (ref 25.1–34.0)
MCHC: 32.1 g/dL (ref 31.5–36.0)
MCV: 86.8 fL (ref 79.5–101.0)
MONO#: 0.5 10*3/uL (ref 0.1–0.9)
MONO%: 10.4 % (ref 0.0–14.0)
NEUT#: 2.3 10*3/uL (ref 1.5–6.5)
NEUT%: 49.1 % (ref 38.4–76.8)
Platelets: 230 10*3/uL (ref 145–400)
RBC: 3.76 10*6/uL (ref 3.70–5.45)
RDW: 13.6 % (ref 11.2–14.5)
WBC: 4.7 10*3/uL (ref 3.9–10.3)

## 2014-02-16 LAB — IRON AND TIBC CHCC
%SAT: 21 % (ref 21–57)
IRON: 66 ug/dL (ref 41–142)
TIBC: 320 ug/dL (ref 236–444)
UIBC: 254 ug/dL (ref 120–384)

## 2014-02-16 LAB — COMPREHENSIVE METABOLIC PANEL (CC13)
ALT: 13 U/L (ref 0–55)
AST: 18 U/L (ref 5–34)
Albumin: 3.6 g/dL (ref 3.5–5.0)
Alkaline Phosphatase: 46 U/L (ref 40–150)
Anion Gap: 9 mEq/L (ref 3–11)
BUN: 29.8 mg/dL — ABNORMAL HIGH (ref 7.0–26.0)
CALCIUM: 9.7 mg/dL (ref 8.4–10.4)
CHLORIDE: 104 meq/L (ref 98–109)
CO2: 29 mEq/L (ref 22–29)
CREATININE: 1.7 mg/dL — AB (ref 0.6–1.1)
Glucose: 113 mg/dl (ref 70–140)
Potassium: 4.1 mEq/L (ref 3.5–5.1)
Sodium: 141 mEq/L (ref 136–145)
Total Bilirubin: 0.25 mg/dL (ref 0.20–1.20)
Total Protein: 6.9 g/dL (ref 6.4–8.3)

## 2014-02-16 LAB — FERRITIN CHCC: Ferritin: 77 ng/ml (ref 9–269)

## 2014-02-16 MED ORDER — INFLUENZA VAC SPLIT QUAD 0.5 ML IM SUSY
0.5000 mL | PREFILLED_SYRINGE | Freq: Once | INTRAMUSCULAR | Status: DC
  Filled 2014-02-16: qty 0.5

## 2014-02-16 NOTE — Telephone Encounter (Signed)
gv and printed appt sched and avs for pt for Jan 2016 °

## 2014-02-16 NOTE — Progress Notes (Signed)
[facility] HEMATOLOGY OFFICE PROGRESS NOTE Date of Visit: 02/16/2014  [name], MD [place] [address]  DIAGNOSIS: Iron deficiency anemia - Plan: CBC with Differential, Ferritin, Iron and TIBC [facility], Influenza vac split quadrivalent PF (FLUARIX) injection 0.5 mL  Chief Complaint  Patient presents with  . Follow-up    CURRENT THERAPY: Ferrous sulfate 325 mg daily with orange juice.   INTERVAL HISTORY:  [name] 80 y.o. female who is has a history of gastric esophageal reflux disease and anemia presents for follow up. Dr. Regis Bill referred the patient for anemia. On 11/25/2012 her hemoglobin was 11.2, on 01/21/2013 it was 10.6. She presents for follow-up with her grand daughter [name]. Last visit was with Dr Juliann Mule on 11/24/2013.  She was last seen by nurse practitioner Willia Craze due to dysphagia and had an EGD and colonoscopy on 03/19/2013 by Dr. Olevia Perches. She had the colonoscopy revealing mild diverticulosis and EGD revealing a mild distal esophageal stricture.    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 She lives with her granddaughter.  She complains of intermittent shoulder pain but otherwise she reports doing pretty well. Of note she's had left shoulder pain which has been evaluated in the past. She had bilateral carpal tunnel syndrome  stenosing tenosynovitis s/p release of median nerve by Dr. Fredna Dow on 10/21/2013. She was last seen by Dr. Regis Bill on 08/18/2013 with changes to alpraz to lorazepam.   INTERVAL HISTORY: Overall patient feels well. She is following with a nephrologist for her renal insufficiency her creatinine is stable. Today it is 1.7. Her hemoglobin is showing an improvement to 10.5 g up from 10.2 g. She's taking oral B12 and oral iron. She likely has some physiological anemia based on her age 37. She denies any chest pain shortness of breath or bleeding symptoms.  MEDICAL  HISTORY:  Past Medical History  Diagnosis Date  . Asthma     " onset when young"   . History of DVT (deep vein thrombosis)     "RLE" (09/09/2012) coumadine cant take asa so on plavix   . Allergy   . GERD (gastroesophageal reflux disease)   . Hyperlipidemia   . Hypertension   . Varicose veins   . Lupus     "cured years ago" (09/09/2012)  . Headache(784.0)     "related to my high blood pressure" (09/09/2012)  . Arthritis     "shoulders" (09/09/2012)  . Anxiety   . Anemia     INTERIM HISTORY: has Varicose veins of leg with complications; Chest pain, atypical; GERD (gastroesophageal reflux disease); Depression; HTN (hypertension); Hyperlipidemia; Asthma; Family hx of colon cancer; Family hx of lung cancer; Family hx-breast malignancy; Renal insufficiency creatinine 1.3; Anemia; Back pain; Headache(784.0); Anxiety state, unspecified; Leg cramps; Other dysphagia; Colon cancer screening; Bereavement due to life event; Leg edema; Death of family member; Anemia of chronic disease; Palpitations; and Ankle pain on her problem list.    ALLERGIES:  is allergic to penicillins and aspirin.  MEDICATIONS: has a current medication list which includes the following prescription(s): advair diskus, albuterol, clopidogrel, doxazosin, fenofibrate, ferrous sulfate, furosemide, lorazepam, metoprolol succinate, montelukast, pantoprazole, potassium chloride, potassium chloride, tribenzor, and vitamin b-12, and the following Facility-Administered Medications: influenza vac split quadrivalent pf.  SURGICAL HISTORY:  Past Surgical History  Procedure Laterality Date  . Abdominal hysterectomy      partial  . Breast surgery    . Carpal tunnel release Right 2000's  .  Shoulder open rotator cuff repair Bilateral 2000's  . Carpal tunnel release Bilateral 10/21/2013    Procedure: BILATERAL  CARPAL TUNNEL RELEASE;  Surgeon: [name], MD;  Location: [facility];  Service: Orthopedics;  Laterality:  Bilateral;  . Trigger finger release Right 10/21/2013    Procedure: RELEASE TRIGGER FINGER/A-1 PULLEY RIGHT MIDDLE AND RIGHT RING;  Surgeon: [name], MD;  Location: [facility];  Service: Orthopedics;  Laterality: Right;    REVIEW OF SYSTEMS:   Constitutional: Denies fevers, chills or abnormal weight loss Eyes: Denies blurriness of vision Ears, nose, mouth, throat, and face: Denies mucositis or sore throat Respiratory: Denies cough, dyspnea or wheezes Cardiovascular: Denies palpitation, chest discomfort or lower extremity swelling Gastrointestinal:  Denies nausea, heartburn or change in bowel habits Skin: Denies abnormal skin rashes Lymphatics: Denies new lymphadenopathy or easy bruising Neurological:Denies numbness, tingling or new weaknesses Behavioral/Psych: Mood is stable, no new changes  All other systems were reviewed with the patient and are negative.  PHYSICAL EXAMINATION: ECOG PERFORMANCE STATUS: 1  Blood pressure 141/59, pulse 65, temperature 98.4 F (36.9 C), temperature source Oral, resp. rate 18, height _0  (1.549 m), weight 195 lb 14.4 oz (88.86 kg), SpO2 99.00%.  GENERAL:alert, no distress and comfortable; elderly female who appears her stated age SKIN: skin color, texture, turgor are normal, no rashes or significant lesions EYES: normal, Conjunctiva are pink and non-injected, sclera clear OROPHARYNX:no exudate, no erythema and lips, buccal mucosa, and tongue normal  NECK: supple, thyroid normal size, non-tender, without nodularity LYMPH:  no palpable lymphadenopathy in the cervical, axillary or supraclavicular LUNGS: clear to auscultation with normal breathing effort HEART: regular rate & rhythm and no murmurs and 1+ lower extremity edema with chronic venous changes ABDOMEN:abdomen soft, non-tender and normal bowel sounds Musculoskeletal:no cyanosis of digits and no clubbing  NEURO: alert & oriented x 3 with fluent speech, no focal motor/sensory  deficits   LABORATORY DATA:  Labs:  Lab Results  Component Value Date   WBC 4.7 02/16/2014   HGB 10.5* 02/16/2014   HCT 32.6* 02/16/2014   MCV 86.8 02/16/2014   PLT 230 02/16/2014   NEUTROABS 2.3 02/16/2014      Chemistry      Component Value Date/Time   NA 141 02/16/2014 1249   NA 140 02/12/2014 0933   K 4.1 02/16/2014 1249   K 4.0 02/12/2014 0933   CL 103 02/12/2014 0933   CO2 29 02/16/2014 1249   CO2 31 02/12/2014 0933   BUN 29.8* 02/16/2014 1249   BUN 27* 02/12/2014 0933   CREATININE 1.7* 02/16/2014 1249   CREATININE 1.7* 02/12/2014 0933      Component Value Date/Time   CALCIUM 9.7 02/16/2014 1249   CALCIUM 9.6 02/12/2014 0933   ALKPHOS 46 02/16/2014 1249   AST 18 02/16/2014 1249   ALT 13 02/16/2014 1249   BILITOT 0.25 02/16/2014 1249      Basic Metabolic Panel:  Recent Labs Lab 02/12/14 0933 02/16/14 1249  NA 140 141  K 4.0 4.1  CL 103  --   CO2 31 29  GLUCOSE 144* 113  BUN 27* 29.8*  CREATININE 1.7* 1.7*  CALCIUM 9.6 9.7  MG 1.9  --    Anemia work up  Recent Labs  02/16/14 1249  FERRITIN 77  TIBC 320  IRON 66     ASSESSMENT: [name] 80 y.o. female with a history of Anemia from CKD and possibly some Myelodysplasia.   PLAN:  1. Anemia likely anemia of chronic disease/kidney disease.  --  Her hemoglobin today is 10.5 grams up from 10.2 down from 10.8 down from 11.5 on last visit.  Her hemoglobin ranged from 10.6-11.5 over the last several months. Her white blood cell count is normal with 4.7 and platelets are normal at 230. Her MCV for her complete blood count is 86.8 with a normal RDW. This is indication of likely anemia of chronic disease. Her blood smear revealed a normocytic anemia. Her   --Her screening SPEP was within normal limits. Both her ESR and CRP was within normal limits. Her TSH screening was negative. Her folate level in April of this year was within normal limits. She is on vitamin B12 with her last level of 1620 in April. Her anemia is  likely secondary to her moderate chronic renal disease. If her hemoglobin is over 11, there is no indications for erythropoietin. We can continue to monitor closely and treat her chronic renal disease. Her iron profile is showing some improvement in serum iron and iron saturation.  2. Chronic Renal Disease, mild.  --Her creatinine is 1.7 in 10/2013 with the remaining of her CMP unremarkable. Her creatinine ranged from 1.2-1.5 all the last few months. Patient has been counseled to avoid nephrotoxins and to continue adequate hydration. Patient should continue with tight glycemic control and maintain a good blood pressure to decrease the chance further decline in renal function.   3. GERD/dysphagia.  --Patient had an EGD and a colonoscopy on 03/19/2013 by Dr. Olevia Perches. With iron index and her ferritin within normal limits, this is less likely to be resulting in her mild anemia. Of note she has a first degree relative with a history of colon cancer. It is as noted in HPI.   4. History of DVT.  --Patient was informed that patients with a history of DVT or more prone to DVT recurrence. He denies any symptoms of swelling in the legs and she reports mobilization. He also denies any acute shortness of breath.   5. Hypertension  -Continue low salt diet and anti-hypertensive medications per PCP.   6. Follow-up  --She will require a repeat CBC and chemistries in 3 months and a follow up at that time.  All questions were answered. The patient knows to call the clinic with any problems, questions or concerns. We can certainly see the patient much sooner if necessary.  I spent 15 minutes counseling the patient face to face. The total time spent in the appointment was 20 minutes.    Bernadene Bell, MD Medical Hematologist/Oncologist [facility] Pager: [phone] Office No: [phone]

## 2014-02-17 ENCOUNTER — Ambulatory Visit (INDEPENDENT_AMBULATORY_CARE_PROVIDER_SITE_OTHER): Payer: Medicare Other | Admitting: Family Medicine

## 2014-02-17 DIAGNOSIS — Z23 Encounter for immunization: Secondary | ICD-10-CM

## 2014-02-20 ENCOUNTER — Other Ambulatory Visit: Payer: Self-pay | Admitting: Internal Medicine

## 2014-02-20 NOTE — Telephone Encounter (Signed)
Ok to refill x 6 months 

## 2014-02-20 NOTE — Telephone Encounter (Signed)
Sent to the pharmacy by e-scribe. 

## 2014-03-04 ENCOUNTER — Telehealth: Payer: Self-pay | Admitting: Internal Medicine

## 2014-03-04 ENCOUNTER — Other Ambulatory Visit: Payer: Self-pay | Admitting: Internal Medicine

## 2014-03-04 NOTE — Telephone Encounter (Signed)
Ok refill x 1

## 2014-03-04 NOTE — Telephone Encounter (Signed)
[facility], [address]. Is requesting re-fill on LORazepam (ATIVAN) 0.5 MG tablet

## 2014-03-04 NOTE — Telephone Encounter (Signed)
Sent to the pharmacy by e-scribe.  Pt has upcoming appt on 04/06/14

## 2014-03-05 ENCOUNTER — Telehealth: Payer: Self-pay | Admitting: Family Medicine

## 2014-03-05 MED ORDER — LORAZEPAM 0.5 MG PO TABS: 0.2500 mg | ORAL_TABLET | Freq: Three times a day (TID) | ORAL | Status: DC | PRN

## 2014-03-05 NOTE — Telephone Encounter (Signed)
Called to the pharmacy and left on voicemail. 

## 2014-03-05 NOTE — Telephone Encounter (Signed)
See lab result from 02/12/14.  Was told to take 4 potassium for 2 wks then do this lab.  Should she continue 4 tabs daily?

## 2014-03-06 NOTE — Telephone Encounter (Signed)
She had lab 10 1 and 10 5?  Can go back down to 2 pills per day 20 meq  And then check BMP in 3-4 weeks if not done elsewhere.

## 2014-03-06 NOTE — Telephone Encounter (Signed)
Left message on machine for patient to return our call 

## 2014-03-09 ENCOUNTER — Other Ambulatory Visit: Payer: Self-pay | Admitting: Family Medicine

## 2014-03-09 DIAGNOSIS — E876 Hypokalemia: Secondary | ICD-10-CM

## 2014-03-09 NOTE — Telephone Encounter (Signed)
Patient notified to take 2 tablets per day.  She has made a future lab appt for 04/03/14 @ 9 AM for BMP

## 2014-03-16 ENCOUNTER — Encounter: Payer: Self-pay | Admitting: Hematology

## 2014-03-25 ENCOUNTER — Ambulatory Visit (HOSPITAL_COMMUNITY): Payer: Medicare Other | Attending: Internal Medicine | Admitting: Cardiology

## 2014-03-25 ENCOUNTER — Encounter: Payer: Self-pay | Admitting: Internal Medicine

## 2014-03-25 ENCOUNTER — Ambulatory Visit: Payer: Self-pay | Admitting: Family Medicine

## 2014-03-25 ENCOUNTER — Ambulatory Visit (INDEPENDENT_AMBULATORY_CARE_PROVIDER_SITE_OTHER): Payer: Medicare Other | Admitting: Internal Medicine

## 2014-03-25 ENCOUNTER — Telehealth: Payer: Self-pay | Admitting: Internal Medicine

## 2014-03-25 VITALS — BP 152/80 | Temp 98.4°F | Ht 61.0 in | Wt 192.5 lb

## 2014-03-25 DIAGNOSIS — M7989 Other specified soft tissue disorders: Secondary | ICD-10-CM | POA: Diagnosis present

## 2014-03-25 DIAGNOSIS — I10 Essential (primary) hypertension: Secondary | ICD-10-CM | POA: Diagnosis not present

## 2014-03-25 DIAGNOSIS — Z87891 Personal history of nicotine dependence: Secondary | ICD-10-CM | POA: Insufficient documentation

## 2014-03-25 DIAGNOSIS — N289 Disorder of kidney and ureter, unspecified: Secondary | ICD-10-CM

## 2014-03-25 DIAGNOSIS — I83893 Varicose veins of bilateral lower extremities with other complications: Secondary | ICD-10-CM

## 2014-03-25 DIAGNOSIS — M79605 Pain in left leg: Secondary | ICD-10-CM

## 2014-03-25 NOTE — Progress Notes (Addendum)
Left lower venous duplex performed   Results given to Dr.Panosh.

## 2014-03-25 NOTE — Assessment & Plan Note (Signed)
Hypertension is worse again unclear reason she is on multiple meds cardiology evaluations see below added an alpha-blocker I'm sure the 10 mg of amlodipine adds to some of her swelling but no alternatives presented at this time we'll increase her dose of alpha-blocker with caution to 4 mg at night and keep her appointment at the end of November.

## 2014-03-25 NOTE — Telephone Encounter (Signed)
Noted.  Pt is seeing Dr. Regis Bill

## 2014-03-25 NOTE — Telephone Encounter (Signed)
Patient Information:  Caller Name: [name]  Phone: [phone]  Patient: [name], [name]  Gender: Female  DOB: May 28, 1933  Age: 80 Years  PCP: [name] (Family Practice)  Office Follow Up:  Does the office need to follow up with this patient?: No  Instructions For The Office: N/A  RN Note:  Patient calling regarding increase swelling of left lower extremity, increase in SOB with walking, and B/P 162/70.  Symptoms  Reason For Call & Symptoms: blood pressure 162/70.   swelling in left leg  Reviewed Health History In EMR: Yes  Reviewed Medications In EMR: Yes  Reviewed Allergies In EMR: Yes  Reviewed Surgeries / Procedures: Yes  Date of Onset of Symptoms: 03/24/2014  Guideline(s) Used:  High Blood Pressure  Leg Swelling and Edema  Breathing Difficulty  Disposition Per Guideline:   Go to Office Now  Reason For Disposition Reached:   Mild difficulty breathing (e.g., minimal/no SOB at rest, SOB with walking, pulse < 100) of new onset or worse than normal  Advice Given:  Call Back If:  You become worse.  Patient Will Follow Care Advice:  YES  Appointment Scheduled:  03/25/2014 15:00:12 Appointment Scheduled Provider:  Alysia Penna [facility])

## 2014-03-25 NOTE — Progress Notes (Addendum)
Pre visit review using our clinic review tool, if applicable. No additional management support is needed unless otherwise documented below in the visit note.  Chief Complaint  Patient presents with  . Hypertension  . Swelling in both legs    HPI: Patient [name]  comes in today for SDA for  roblem evaluation. Has known HT leg edema remote hs of dvt adn recnet eval for anemia   And swelling and ht   Dr Santa Genera eval below  A little over a month ago  Sent in by can to day as emergent work in because of  3 weeks  Of increasing swelling left leg and back pof leg and   Back no different for pain  Pressure going up and down .  Also .  Some anxiety and some short winded.  also bp  Up to 162 152 range and concern  Dr Acie Fredrickson had added alpha blocker low dose about 1-2 months ago   No se noted  ROS: See pertinent positives and negatives per HPI. No cp othersise still anxious no falling no neuro sx  No numbness no bleeding using inhalers  No cough hemoptysis   She's been seen by hematology also in the recent past for her anemia. Possibly chronic disease had used Iron ton it made no difference caus of iron deficiency   Past Medical History  Diagnosis Date  . Asthma     " onset when young"   . History of DVT (deep vein thrombosis)     "RLE" (09/09/2012) coumadine cant take asa so on plavix   . Allergy   . GERD (gastroesophageal reflux disease)   . Hyperlipidemia   . Hypertension   . Varicose veins   . Lupus     "cured years ago" (09/09/2012)  . Headache(784.0)     "related to my high blood pressure" (09/09/2012)  . Arthritis     "shoulders" (09/09/2012)  . Anxiety   . Anemia     Family History  Problem Relation Age of Onset  . Hypertension Mother   . Hypertension Father   . Cancer Sister     Colon and Breast Cancer  . Hypertension Brother   . Heart attack Daughter   . Lung cancer      both parents     History   Social History  . Marital Status: Single    Spouse Name:  N/A    Number of Children: N/A  . Years of Education: N/A   Social History Main Topics  . Smoking status: Never Smoker   . Smokeless tobacco: Never Used  . Alcohol Use: No  . Drug Use: No  . Sexual Activity: No   Other Topics Concern  . None   Social History Narrative   2 people living in the home.  Grand daughter.   Up and down through the night      Had 7 children  2 deceased . 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    works 3 days per week currently .   Glasses dentures  Neg tad     Outpatient Encounter Prescriptions as of 03/25/2014  Medication Sig  . ADVAIR DISKUS 250-50 MCG/DOSE AEPB USE 1 PUFF TWICE DAILY.  [name] albuterol (PROVENTIL) (2.5 MG/3ML) 0.083% nebulizer solution Take 3 mLs (2.5 mg total) by nebulization every 6 (six) hours as needed. For wheezing  . clopidogrel (  PLAVIX) 75 MG tablet TAKE 1 TABLET ONCE DAILY.  [name] doxazosin (CARDURA) 2 MG tablet Daily in the evening  . fenofibrate 160 MG tablet TAKE 1 TABLET ONCE DAILY.  . ferrous sulfate 325 (65 FE) MG EC tablet Take 1 tablet (325 mg total) by mouth daily with breakfast.  . furosemide (LASIX) 20 MG tablet TAKE 1 TABLET EACH DAY.  . LORazepam (ATIVAN) 0.5 MG tablet Take 0.5-1 tablets (0.25-0.5 mg total) by mouth every 8 (eight) hours as needed for anxiety or sleep.  . metoprolol succinate (TOPROL-XL) 50 MG 24 hr tablet TAKE 1 TABLET ONCE DAILY WITH FOOD.  [name] montelukast (SINGULAIR) 10 MG tablet TAKE 1 TABLET ONCE DAILY.  . pantoprazole (PROTONIX) 40 MG tablet TAKE 1 TABLET ONCE DAILY.  [name] potassium chloride (K-DUR) 10 MEQ tablet TAKE (2) TABLETS DAILY.  [name] TRIBENZOR 40-10-25 MG TABS TAKE 1 TABLET ONCE DAILY.  . vitamin B-12 (CYANOCOBALAMIN) 1000 MCG tablet Take 1,000 mcg by mouth daily.    EXAM:  BP 152/80 mmHg  Temp(Src) 98.4 F (36.9 C) (Oral)  Ht 5\' 1"  (1.549 m)  Wt 192 lb 8 oz (87.317 kg)  BMI 36.39 kg/m2  Body mass index is  36.39 kg/(m^2). daughter dp alos here today after exam  GENERAL: vitals reviewed and listed above, alert, oriented, appears well hydrated and in no acute distress HEENT: atraumatic, conjunctiva  clear, no obvious abnormalities on inspection of external nose and ears OP : no lesion edema or exudate  NECK: no obvious masses on inspection palpation no jvd  LUNGS: clear to auscultation bilaterally, no wheezes, rales or rhonchi, good air movement CV: HRRR, no clubbing cyanosis  Lower extremities right lower extremity +1 edema left lower extremity +2 to +3 edema and much tighter than the right minimal tenderness in the posterior area no specific cords or ulcerations seen  MS: moves all extremities without noticeable focal  abnormalityotherwise PSYCH: pleasant and cooperative,  Cognition and memory seem intact  . ASSESSMENT AND PLAN:  Discussed the following assessment and plan:  Left leg swelling - acute on chronic check Doppler - Plan: Lower Extremity Venous Duplex Left  Essential hypertension - worsening again unclear reason multiple meds see note  Renal insufficiency creatinine 1.3  Varicose veins of leg with complications, bilateral Acute on chronic seems to be more asymmetrical in usual no specific redness. She also has osteoarthritis in her knee and and back. We'll get another repeat Doppler acutely of her left lower extremity. No obvious pulmonary decompensation she does have asthmatic symptoms on inhalers.  Hypertension is worse again unclear reason she is on multiple meds cardiology evaluations see below added an alpha-blocker I'm sure the 10 mg of amlodipine adds to some of her swelling but no alternatives presented at this time we'll increase her dose of alpha-blocker with caution to 4 mg at night and keep her appointment at the end of November. -Patient advised to return or notify health care team  if symptoms worsen ,persist or new concerns arise.  Patient Instructions  Will  repeat doppler test of left leg to be sure havent developed blood clot. Your lungs are good  On exam and oxygen content is normal .   Your blood pressure is up again . Uncertain if  The med cardiology gave you is helping  We may increase that med  Or change again.  i will contact  Dr Acie Fredrickson about this.  Also   In the meantime increase to 4 mg per day at night of  the cardura.   Caution to avoid low bp when standing up .    Standley Brooking. Panosh M.D. HTN (hypertension) - Assessment & Plan Note    Expand All Collapse All   Her Bp is elevated today. Will add Cardura 2 mg a day. This can be titrated up to 8 mg a day if needed. Further evaluation and management per Dr. Regis Bill.         Leg edema - Assessment & Plan Note    Expand All Collapse All   She's on high-dose amlodipine. This is likely contributing to her leg edema. She has normal left ventricle systolic function by echo as of last year. Stressed to her that the important treatments for leg edema including leg elevation, compression hose, and ambulation. I have encouraged her to start a walking program to walk on a daily basis.  She has seen the vascular surgeons at [facility] and was not found to have any episodes of DVT.  She was previously on Coumadin. For some reason the Coumadin was stopped and she was placed on Plavix. She's never had a stent. In my opinion, I do not think that she needs Plavix therapy if this was started in place of Coumadin. There was no evidence of DVT by venous duplex in 2012 and she was not re-started on coumadin at that time. I will leave the decision of whether or not to continue Plavix up to Dr. Regis Bill.          Verbal report from lab shows significant superficial thrombophlebitis below the knee in her left lower extremity. No DVT noted and nothing above the thigh. Discussed with daughter at this time elevate leg have to consider other options but I need the final report to review before making more  decisions.  Total visit 81mins > 50% spent counseling and coordinating care     Sometimes we use anticoagulants with extensive superficial disease.    Instructed by [facility] to call for doppler results. Called [facility] at [phone].  Was informed by Solmon Ice that the imaging has not been read by the physician.  WP notified.

## 2014-03-25 NOTE — Patient Instructions (Addendum)
Will repeat doppler test of left leg to be sure havent developed blood clot. Your lungs are good  On exam and oxygen content is normal .   Your blood pressure is up again . Uncertain if  The med cardiology gave you is helping  We may increase that med  Or change again.  i will contact  Dr Acie Fredrickson about this.  Also   In the meantime increase to 4 mg per day at night of the cardura.   Caution to avoid low bp when standing up .

## 2014-03-27 ENCOUNTER — Encounter: Payer: Self-pay | Admitting: Internal Medicine

## 2014-03-27 ENCOUNTER — Ambulatory Visit (INDEPENDENT_AMBULATORY_CARE_PROVIDER_SITE_OTHER): Payer: Medicare Other | Admitting: Internal Medicine

## 2014-03-27 VITALS — BP 142/78 | HR 71 | Temp 98.4°F | Ht 61.0 in | Wt 193.0 lb

## 2014-03-27 DIAGNOSIS — I82812 Embolism and thrombosis of superficial veins of left lower extremities: Secondary | ICD-10-CM

## 2014-03-27 DIAGNOSIS — I83893 Varicose veins of bilateral lower extremities with other complications: Secondary | ICD-10-CM

## 2014-03-27 DIAGNOSIS — Z86718 Personal history of other venous thrombosis and embolism: Secondary | ICD-10-CM | POA: Insufficient documentation

## 2014-03-27 DIAGNOSIS — I10 Essential (primary) hypertension: Secondary | ICD-10-CM | POA: Insufficient documentation

## 2014-03-27 HISTORY — DX: Embolism and thrombosis of superficial veins of left lower extremity: I82.812

## 2014-03-27 MED ORDER — ENOXAPARIN SODIUM 40 MG/0.4ML ~~LOC~~ SOLN: 40.0000 mg | SUBCUTANEOUS | Status: DC

## 2014-03-27 MED ORDER — DOXAZOSIN MESYLATE 2 MG PO TABS: 4.0000 mg | ORAL_TABLET | Freq: Every day | ORAL | Status: DC

## 2014-03-27 NOTE — Patient Instructions (Signed)
Stop the plavix and   We will begin low dose  Lovenox   elevated leg   Until pain is down .   [name] in 7   days or thereabout.    Enoxaparin, Home Use Enoxaparin (Lovenox) injection is a medication used to prevent clots from developing in your veins. Medications such as enoxaparin are called blood thinners or anticoagulants. If blood clots are untreated they could travel to your lungs. This is called a pulmonary embolus. A blood clot in your lungs can be fatal. Caregivers often use anticoagulants such as enoxaparin to prevent clots following surgery. It is also used along with aspirin when the heart is not getting enough blood. Continue the enoxaparin injections as directed by your caregiver. Your caregiver will use blood clotting test results to decide when you can safely stop using enoxaparin injections. If your caregiver prescribes any additional anticoagulant, you must take it exactly as directed. RISKS AND COMPLICATIONS  If you have received recent epidural anesthesia, spinal anesthesia, or a spinal tap while receiving anticoagulants, you are at risk for developing a blood clot in or around the spine. This condition could result in long-term or permanent paralysis.  Because anticoagulants thin your blood, severe bleeding may occur from any tissue or organ. Symptoms of the blood being too thin may include:  Bleeding from the nose or gums that does not stop quickly.  Unusual bruising or bruising easily.  Swelling or pain at an injection site.  A cut that does not stop bleeding within 10 minutes.  Continual nausea for more than 1 day or vomiting blood.  Coughing up blood.  Blood in the urine which may appear as pink, red, or brown urine.  Blood in bowel movements which may appear as red, dark or black stools.  Sudden weakness or numbness of the face, arm, or leg, especially on one side of the body.  Sudden confusion.  Trouble speaking (aphasia) or understanding.  Sudden trouble  seeing in one or both eyes.  Sudden trouble walking.  Dizziness.  Loss of balance or coordination.  Severe pain, such as a headache, joint pain, or back pain.  Fever.  Bruising around the injection sites may be expected.  Platelet drops, known as "thrombocytopenia," can occur with enoxaparin use. A condition called "heparin-induced thrombocytopenia" has been seen. If you have had this condition, you should tell your caregiver. Your caregiver may direct you to have blood tests to monitor this condition.  Do not use if you have allergies to the medication, heparin, or pork products.  Other side effects may include mild local reactions or irritation at the site of injection, pain, bruising, and redness of skin. HOME CARE INSTRUCTIONS You will be instructed by your caregiver how to give enoxaparin injections. 1. Before giving your medication you should make sure the injection is a clear and colorless or pale yellow solution. If your medication becomes discolored or has particles in the bottle, do not use and notify your caregiver. 2. When using the 30 and 40 mg pre-filled syringes, do not expel the air bubble from the syringe before the injection. This makes sure you use all the medication in the syringe. 3. The injections will be given subcutaneously. This means it is given into the fat over the belly (abdomen). It is given deep beneath the skin but not into the muscle. The shots should be injected around the abdominal wall. Change the sites of injection each time. The whole length of the needle should be introduced  into a skin fold held between the thumb and forefinger; the skin fold should be held throughout the injection. Do not rub the injection site after completion of the injection. This increases bruising. Enoxaparin injection pre-filled syringes and graduated pre-filled syringes are available with a system that shields the needle after injection. 4. Inject by pushing the plunger to the  bottom of the syringe. 5. Remove the syringe from the injection site keeping your finger on the plunger rod. Be careful not to stick yourself or others. 6. After injection and the syringe is empty, set off the safety system by firmly pushing the plunger rod. The protective sleeve will automatically cover the needle and you can hear a click. The click means your needle is safely covered. Do not try replacing the needle shield. 7. Get rid of the syringe in the nearest sharps container. 8. Keep your medication safely stored at room temperatures.  Due to the complications of anticoagulants, it is very important that you take your anticoagulant as directed by your caregiver. Anticoagulants need to be taken exactly as instructed. Be sure you understand all your anticoagulant instructions.  Changes in medicines, supplements, diet, and illness can affect your anticoagulation therapy. Be sure to inform your caregivers of any of these changes.  While on anticoagulants, you will need to have blood tests done routinely as directed by your caregivers.  Be careful not to cut yourself when using sharp objects.  Limit physical activities or sports that could result in a fall or cause injury.  It is extremely important that you tell all of your caregivers and dentist that you are taking an anticoagulant, especially if you are injured or plan to have any type of procedure or operation.  Follow up with your laboratory test and caregiver appointments as directed. It is very important to keep your appointments. Not keeping appointments could result in a chronic or permanent injury, pain, or disability. SEEK MEDICAL CARE IF:  You develop any rashes.  You have any worsening of the condition for which you are receiving anticoagulation therapy. SEEK IMMEDIATE MEDICAL CARE IF:  Bleeding from the nose or gums does not stop quickly.  You have unusual bruising or are bruising easily.  Swelling or pain occurs at an  injection site.  A cut does not stop bleeding within 10 minutes.  You have continual nausea for more than 1 day or are vomiting blood.  You are coughing up blood.  You have blood in the urine.  You have dark or black stools.  You have sudden weakness or numbness of the face, arm, or leg, especially on one side of the body.  You have sudden confusion.  You have trouble speaking (aphasia) or understanding.  You have sudden trouble seeing in one or both eyes.  You have sudden trouble walking.  You have dizziness.  You have a loss of balance or coordination.  You have severe pain, such as a headache, joint pain, or back pain.  You have a serious fall or head injury, even if you are not bleeding.  You have an oral temperature above 102 F (38.9 C), not controlled by medicine. ANY OF THESE SYMPTOMS MAY REPRESENT A SERIOUS PROBLEM THAT IS AN EMERGENCY. Do not wait to see if the symptoms will go away. Get medical help right away. Call your local emergency services (911 in [place] DO NOT drive yourself to the hospital. MAKE SURE YOU:  Understand these instructions.  Will watch your condition.  Will get help  right away if you are not doing well or get worse. Document Released: 03/02/2004 Document Revised: 07/24/2011 Document Reviewed: 05/01/2005 [facility] Patient Information 2015 [place], [place]. This information is not intended to replace advice given to you by your health care provider. Make sure you discuss any questions you have with your health care provider.

## 2014-03-27 NOTE — Progress Notes (Signed)
Pre visit review using our clinic review tool, if applicable. No additional management support is needed unless otherwise documented below in the visit note.  Chief Complaint  Patient presents with  . Follow-up    lerg pain swelling dopler    HPI: [name] 80 y.o. Comes in today as requested for a follow-up regarding her left leg swelling .  And hypertension.She had a Doppler venous ultrasound done 2 days ago preliminary superficial thrombosis final report available last evening.showing significant  Superficial venous thrombosis in the lesser saphenous mid and distal and 2 gastrocnemius veins otherwise noted DVT. She continues to have some pain has been putting her leg up pain is posterior and down to the medial ankle area. Breathing response to her inhaler no worsening fine at rest no increased cough  Past history reviewed: appears to have had a DVT in the right lower extremity and some superficial but review the chart does not show DVT in the left. She saw a vascular 1 2012 about her legs no intervention appropriate that time except for compression she saw cardiology a month or so ago before the tenderness in her left leg. She also has seen hematology in regard to anemia which is now felt to be anemia of chronic disease. At some point she was on Coumadin assumed for the DVT.this was in the right leg ROS: See pertinent positives and negatives per HPI.no history of recent bleeding  Active bleeding  Past Medical History  Diagnosis Date  . Asthma     " onset when young"   . History of DVT (deep vein thrombosis)     "RLE" (09/09/2012) coumadine cant take asa so on plavix   . Allergy   . GERD (gastroesophageal reflux disease)   . Hyperlipidemia   . Hypertension   . Varicose veins   . Lupus     "cured years ago" (09/09/2012)  . Headache(784.0)     "related to my high blood pressure" (09/09/2012)  . Arthritis     "shoulders" (09/09/2012)  . Anxiety   . Anemia     Family History    Problem Relation Age of Onset  . Hypertension Mother   . Hypertension Father   . Cancer Sister     Colon and Breast Cancer  . Hypertension Brother   . Heart attack Daughter   . Lung cancer      both parents     History   Social History  . Marital Status: Single    Spouse Name: N/A    Number of Children: N/A  . Years of Education: N/A   Social History Main Topics  . Smoking status: Never Smoker   . Smokeless tobacco: Never Used  . Alcohol Use: No  . Drug Use: No  . Sexual Activity: No   Other Topics Concern  . None   Social History Narrative   2 people living in the home.  Grand daughter.   Up and down through the night      Had 7 children  2 deceased . 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    works 3 days per week currently .   Glasses dentures  Neg tad     Outpatient Encounter Prescriptions as of 03/27/2014  Medication Sig  . ADVAIR DISKUS 250-50 MCG/DOSE AEPB USE 1 PUFF TWICE DAILY.  [name] albuterol (PROVENTIL) (2.5 MG/3ML) 0.083% nebulizer solution Take  3 mLs (2.5 mg total) by nebulization every 6 (six) hours as needed. For wheezing  . clopidogrel (PLAVIX) 75 MG tablet TAKE 1 TABLET ONCE DAILY.  [name] doxazosin (CARDURA) 2 MG tablet Take 2 tablets (4 mg total) by mouth at bedtime. Daily in the evening  . fenofibrate 160 MG tablet TAKE 1 TABLET ONCE DAILY.  . ferrous sulfate 325 (65 FE) MG EC tablet Take 1 tablet (325 mg total) by mouth daily with breakfast.  . furosemide (LASIX) 20 MG tablet TAKE 1 TABLET EACH DAY.  . LORazepam (ATIVAN) 0.5 MG tablet Take 0.5-1 tablets (0.25-0.5 mg total) by mouth every 8 (eight) hours as needed for anxiety or sleep.  . metoprolol succinate (TOPROL-XL) 50 MG 24 hr tablet TAKE 1 TABLET ONCE DAILY WITH FOOD.  [name] montelukast (SINGULAIR) 10 MG tablet TAKE 1 TABLET ONCE DAILY.  . pantoprazole (PROTONIX) 40 MG tablet TAKE 1 TABLET ONCE DAILY.  [name] potassium chloride  (K-DUR) 10 MEQ tablet TAKE (2) TABLETS DAILY.  [name] TRIBENZOR 40-10-25 MG TABS TAKE 1 TABLET ONCE DAILY.  . vitamin B-12 (CYANOCOBALAMIN) 1000 MCG tablet Take 1,000 mcg by mouth daily.  . [DISCONTINUED] doxazosin (CARDURA) 2 MG tablet Daily in the evening  . enoxaparin (LOVENOX) 40 MG/0.4ML injection Inject 0.4 mLs (40 mg total) into the skin daily.    EXAM:  BP 142/78 mmHg  Pulse 71  Temp(Src) 98.4 F (36.9 C) (Oral)  Ht 5\' 1"  (1.549 m)  Wt 193 lb (87.544 kg)  BMI 36.49 kg/m2  SpO2 97%  Body mass index is 36.49 kg/(m^2). Also here with her sister. GENERAL: vitals reviewed and listed above, alert, oriented, appears well hydrated and in no acute distressquiet respirations at rest ambulatory HEENT: atraumatic, conjunctiva  clear, no obvious abnormalities on inspection of external nose and ears  No JVD LUNGS: clear to auscultation bilaterally, no wheezes, rales or rhonchi, good air movement CV: HRRR, no clubbing cyanosis  nl cap refill F lower extremity tenderness and some cords in the posterior calf and medial leg edema about the same no streaking MS: moves all extremities  PSYCH: pleasant and cooperative, no obvious depression or anxiety Venous Doppler report shows no DVT but mid to distal lesser saphenous acute thrombosis with some chronicsubacute areas in the gastrocnemius ASSESSMENT AND PLAN:  Discussed the following assessment and plan:  Acute superficial venous thrombosis of left lower extremity - concern about hx and potential extensive on exam tenderness calf andlow dose lovenox 40 qd 2-4 weekselevetion and close  fu advised .  Varicose veins of leg with complications, bilateral  History of DVT (deep vein thrombosis) - right leg  in past treated with Coumadin dvt was in right leg  Concern about  Poss extension based on exam although cant tell from doppler how close to deep junction     Disc with pat sis and will have GD com back before 5 to show how injection done  AlsoKK who  is rn will hel p on some days she is involved .  No active bleeding and bp is coming down Chest is clear today and pulse ox  Good.  -Patient advised to return or notify health care team  if symptoms worsen ,persist or new concerns arise.  Patient Instructions  Stop the plavix and   We will begin low dose  Lovenox   elevated leg   Until pain is down .   [name] in 7   days or thereabout.    Enoxaparin, Home Use Enoxaparin (Lovenox)  injection is a medication used to prevent clots from developing in your veins. Medications such as enoxaparin are called blood thinners or anticoagulants. If blood clots are untreated they could travel to your lungs. This is called a pulmonary embolus. A blood clot in your lungs can be fatal. Caregivers often use anticoagulants such as enoxaparin to prevent clots following surgery. It is also used along with aspirin when the heart is not getting enough blood. Continue the enoxaparin injections as directed by your caregiver. Your caregiver will use blood clotting test results to decide when you can safely stop using enoxaparin injections. If your caregiver prescribes any additional anticoagulant, you must take it exactly as directed. RISKS AND COMPLICATIONS  If you have received recent epidural anesthesia, spinal anesthesia, or a spinal tap while receiving anticoagulants, you are at risk for developing a blood clot in or around the spine. This condition could result in long-term or permanent paralysis.  Because anticoagulants thin your blood, severe bleeding may occur from any tissue or organ. Symptoms of the blood being too thin may include:  Bleeding from the nose or gums that does not stop quickly.  Unusual bruising or bruising easily.  Swelling or pain at an injection site.  A cut that does not stop bleeding within 10 minutes.  Continual nausea for more than 1 day or vomiting blood.  Coughing up blood.  Blood in the urine which may appear as pink, red, or  brown urine.  Blood in bowel movements which may appear as red, dark or black stools.  Sudden weakness or numbness of the face, arm, or leg, especially on one side of the body.  Sudden confusion.  Trouble speaking (aphasia) or understanding.  Sudden trouble seeing in one or both eyes.  Sudden trouble walking.  Dizziness.  Loss of balance or coordination.  Severe pain, such as a headache, joint pain, or back pain.  Fever.  Bruising around the injection sites may be expected.  Platelet drops, known as "thrombocytopenia," can occur with enoxaparin use. A condition called "heparin-induced thrombocytopenia" has been seen. If you have had this condition, you should tell your caregiver. Your caregiver may direct you to have blood tests to monitor this condition.  Do not use if you have allergies to the medication, heparin, or pork products.  Other side effects may include mild local reactions or irritation at the site of injection, pain, bruising, and redness of skin. HOME CARE INSTRUCTIONS You will be instructed by your caregiver how to give enoxaparin injections. 1. Before giving your medication you should make sure the injection is a clear and colorless or pale yellow solution. If your medication becomes discolored or has particles in the bottle, do not use and notify your caregiver. 2. When using the 30 and 40 mg pre-filled syringes, do not expel the air bubble from the syringe before the injection. This makes sure you use all the medication in the syringe. 3. The injections will be given subcutaneously. This means it is given into the fat over the belly (abdomen). It is given deep beneath the skin but not into the muscle. The shots should be injected around the abdominal wall. Change the sites of injection each time. The whole length of the needle should be introduced into a skin fold held between the thumb and forefinger; the skin fold should be held throughout the injection. Do not  rub the injection site after completion of the injection. This increases bruising. Enoxaparin injection pre-filled syringes and graduated pre-filled syringes  are available with a system that shields the needle after injection. 4. Inject by pushing the plunger to the bottom of the syringe. 5. Remove the syringe from the injection site keeping your finger on the plunger rod. Be careful not to stick yourself or others. 6. After injection and the syringe is empty, set off the safety system by firmly pushing the plunger rod. The protective sleeve will automatically cover the needle and you can hear a click. The click means your needle is safely covered. Do not try replacing the needle shield. 7. Get rid of the syringe in the nearest sharps container. 8. Keep your medication safely stored at room temperatures.  Due to the complications of anticoagulants, it is very important that you take your anticoagulant as directed by your caregiver. Anticoagulants need to be taken exactly as instructed. Be sure you understand all your anticoagulant instructions.  Changes in medicines, supplements, diet, and illness can affect your anticoagulation therapy. Be sure to inform your caregivers of any of these changes.  While on anticoagulants, you will need to have blood tests done routinely as directed by your caregivers.  Be careful not to cut yourself when using sharp objects.  Limit physical activities or sports that could result in a fall or cause injury.  It is extremely important that you tell all of your caregivers and dentist that you are taking an anticoagulant, especially if you are injured or plan to have any type of procedure or operation.  Follow up with your laboratory test and caregiver appointments as directed. It is very important to keep your appointments. Not keeping appointments could result in a chronic or permanent injury, pain, or disability. SEEK MEDICAL CARE IF:  You develop any  rashes.  You have any worsening of the condition for which you are receiving anticoagulation therapy. SEEK IMMEDIATE MEDICAL CARE IF:  Bleeding from the nose or gums does not stop quickly.  You have unusual bruising or are bruising easily.  Swelling or pain occurs at an injection site.  A cut does not stop bleeding within 10 minutes.  You have continual nausea for more than 1 day or are vomiting blood.  You are coughing up blood.  You have blood in the urine.  You have dark or black stools.  You have sudden weakness or numbness of the face, arm, or leg, especially on one side of the body.  You have sudden confusion.  You have trouble speaking (aphasia) or understanding.  You have sudden trouble seeing in one or both eyes.  You have sudden trouble walking.  You have dizziness.  You have a loss of balance or coordination.  You have severe pain, such as a headache, joint pain, or back pain.  You have a serious fall or head injury, even if you are not bleeding.  You have an oral temperature above 102 F (38.9 C), not controlled by medicine. ANY OF THESE SYMPTOMS MAY REPRESENT A SERIOUS PROBLEM THAT IS AN EMERGENCY. Do not wait to see if the symptoms will go away. Get medical help right away. Call your local emergency services (911 in [place] DO NOT drive yourself to the hospital. MAKE SURE YOU:  Understand these instructions.  Will watch your condition.  Will get help right away if you are not doing well or get worse. Document Released: 03/02/2004 Document Revised: 07/24/2011 Document Reviewed: 05/01/2005 [facility] Patient Information 2015 [place], [place]. This information is not intended to replace advice given to you by your health care provider.  Make sure you discuss any questions you have with your health care provider.      Standley Brooking. Panosh M.D.  Lab Results  Component Value Date   WBC 4.7 02/16/2014   HGB 10.5* 02/16/2014   HCT 32.6* 02/16/2014   PLT 230  02/16/2014   GLUCOSE 113 02/16/2014   CHOL 171 09/09/2012   TRIG 148 09/09/2012   HDL 47 09/09/2012   LDLCALC 94 09/09/2012   ALT 13 02/16/2014   AST 18 02/16/2014   NA 141 02/16/2014   K 4.1 02/16/2014   CL 103 02/12/2014   CREATININE 1.7* 02/16/2014   BUN 29.8* 02/16/2014   CO2 29 02/16/2014   TSH 1.786 09/09/2012   INR 0.94 12/22/2009

## 2014-04-03 ENCOUNTER — Other Ambulatory Visit: Payer: Self-pay | Admitting: Family Medicine

## 2014-04-03 ENCOUNTER — Encounter: Payer: Self-pay | Admitting: Internal Medicine

## 2014-04-03 ENCOUNTER — Ambulatory Visit (INDEPENDENT_AMBULATORY_CARE_PROVIDER_SITE_OTHER): Payer: Medicare Other | Admitting: Internal Medicine

## 2014-04-03 ENCOUNTER — Other Ambulatory Visit: Payer: Medicare Other

## 2014-04-03 VITALS — BP 150/60 | HR 58 | Temp 98.2°F | Wt 190.0 lb

## 2014-04-03 DIAGNOSIS — Z23 Encounter for immunization: Secondary | ICD-10-CM

## 2014-04-03 DIAGNOSIS — I10 Essential (primary) hypertension: Secondary | ICD-10-CM

## 2014-04-03 DIAGNOSIS — E876 Hypokalemia: Secondary | ICD-10-CM

## 2014-04-03 DIAGNOSIS — I83893 Varicose veins of bilateral lower extremities with other complications: Secondary | ICD-10-CM

## 2014-04-03 DIAGNOSIS — N289 Disorder of kidney and ureter, unspecified: Secondary | ICD-10-CM

## 2014-04-03 DIAGNOSIS — I82812 Embolism and thrombosis of superficial veins of left lower extremities: Secondary | ICD-10-CM

## 2014-04-03 DIAGNOSIS — Z86718 Personal history of other venous thrombosis and embolism: Secondary | ICD-10-CM

## 2014-04-03 DIAGNOSIS — F411 Generalized anxiety disorder: Secondary | ICD-10-CM

## 2014-04-03 LAB — BASIC METABOLIC PANEL
BUN: 29 mg/dL — ABNORMAL HIGH (ref 6–23)
CHLORIDE: 100 meq/L (ref 96–112)
CO2: 31 mEq/L (ref 19–32)
Calcium: 10.3 mg/dL (ref 8.4–10.5)
Creatinine, Ser: 1.5 mg/dL — ABNORMAL HIGH (ref 0.4–1.2)
GFR: 42.19 mL/min — ABNORMAL LOW (ref >60.00)
GLUCOSE: 116 mg/dL — AB (ref 70–99)
POTASSIUM: 3.5 meq/L (ref 3.5–5.1)
SODIUM: 138 meq/L (ref 135–145)

## 2014-04-03 LAB — URIC ACID: URIC ACID, SERUM: 8.7 mg/dL — AB (ref 2.4–7.0)

## 2014-04-03 MED ORDER — LORAZEPAM 1 MG PO TABS: 1.0000 mg | ORAL_TABLET | Freq: Three times a day (TID) | ORAL | Status: DC | PRN

## 2014-04-03 MED ORDER — ENOXAPARIN SODIUM 40 MG/0.4ML ~~LOC~~ SOLN: 40.0000 mg | SUBCUTANEOUS | Status: DC

## 2014-04-03 NOTE — Patient Instructions (Addendum)
Increase   Dose of anxiety medication   Lorazepam and can take twice a day if needed for anxiety. Continue  lovenox  For now   Leg looks much better  Recheck in   Week of november 30 and we may end of getting a repeat doppler . At that time to decide on how long to stay on the blood thinner .  Compression stockings  .    To be fit  For this .   To wear every day and help the swelling . bp still up 150 range  We may need t consider other changes  Also .

## 2014-04-03 NOTE — Progress Notes (Signed)
Pre visit review using our clinic review tool, if applicable. No additional management support is needed unless otherwise documented below in the visit note.  Chief Complaint  Patient presents with  . Follow-up    svt ht , leg pain    HPI:  [name] 80 y.o. fu of extensive  superficial svt left le  On 1 week of low dose lovenox   Leg is much less painful and elevated   gd is giving her the injections   Ht still on 4 mg cardura at night  bp up and down.  Anxiety  Attacks at times   Out of breath . And palpitations  And then stop  The low dose ativan not as helpful as the previous med .  Describes as episodic sob and upper chest  Sx . No cough no hemoptysis   ROS: See pertinent positives and negatives per HPI. To travel t giving to beach   Past Medical History  Diagnosis Date  . Asthma     " onset when young"   . History of DVT (deep vein thrombosis)     "RLE" (09/09/2012) coumadine cant take asa so on plavix   . Allergy   . GERD (gastroesophageal reflux disease)   . Hyperlipidemia   . Hypertension   . Varicose veins   . Lupus     "cured years ago" (09/09/2012)  . Headache(784.0)     "related to my high blood pressure" (09/09/2012)  . Arthritis     "shoulders" (09/09/2012)  . Anxiety   . Anemia     Family History  Problem Relation Age of Onset  . Hypertension Mother   . Hypertension Father   . Cancer Sister     Colon and Breast Cancer  . Hypertension Brother   . Heart attack Daughter   . Lung cancer      both parents     History   Social History  . Marital Status: Single    Spouse Name: N/A    Number of Children: N/A  . Years of Education: N/A   Social History Main Topics  . Smoking status: Never Smoker   . Smokeless tobacco: Never Used  . Alcohol Use: No  . Drug Use: No  . Sexual Activity: No   Other Topics Concern  . None   Social History Narrative   2 people living in the home.  Grand daughter.   Up and down through the night      Had 7  children  2 deceased . 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    works 3 days per week currently .   Glasses dentures  Neg tad     Outpatient Encounter Prescriptions as of 04/03/2014  Medication Sig  . ADVAIR DISKUS 250-50 MCG/DOSE AEPB USE 1 PUFF TWICE DAILY.  [name] albuterol (PROVENTIL) (2.5 MG/3ML) 0.083% nebulizer solution Take 3 mLs (2.5 mg total) by nebulization every 6 (six) hours as needed. For wheezing  . clopidogrel (PLAVIX) 75 MG tablet TAKE 1 TABLET ONCE DAILY. Hold wile on lovenox  . doxazosin (CARDURA) 2 MG tablet Take 2 tablets (4 mg total) by mouth at bedtime. Daily in the evening  . enoxaparin (LOVENOX) 40 MG/0.4ML injection Inject 0.4 mLs (40 mg total) into the skin daily.  . fenofibrate 160 MG tablet TAKE 1 TABLET ONCE DAILY.  . ferrous sulfate 325 (65 FE)  MG EC tablet Take 1 tablet (325 mg total) by mouth daily with breakfast.  . furosemide (LASIX) 20 MG tablet TAKE 1 TABLET EACH DAY.  . metoprolol succinate (TOPROL-XL) 50 MG 24 hr tablet TAKE 1 TABLET ONCE DAILY WITH FOOD.  [name] montelukast (SINGULAIR) 10 MG tablet TAKE 1 TABLET ONCE DAILY.  . pantoprazole (PROTONIX) 40 MG tablet TAKE 1 TABLET ONCE DAILY.  [name] potassium chloride (K-DUR) 10 MEQ tablet TAKE (2) TABLETS DAILY.  [name] TRIBENZOR 40-10-25 MG TABS TAKE 1 TABLET ONCE DAILY.  . vitamin B-12 (CYANOCOBALAMIN) 1000 MCG tablet Take 1,000 mcg by mouth daily.  . [DISCONTINUED] enoxaparin (LOVENOX) 40 MG/0.4ML injection Inject 0.4 mLs (40 mg total) into the skin daily.  . [DISCONTINUED] LORazepam (ATIVAN) 0.5 MG tablet Take 0.5-1 tablets (0.25-0.5 mg total) by mouth every 8 (eight) hours as needed for anxiety or sleep.  [name] LORazepam (ATIVAN) 1 MG tablet Take 1 tablet (1 mg total) by mouth every 8 (eight) hours as needed for anxiety.    EXAM:  BP 150/60 mmHg  Pulse 58  Temp(Src) 98.2 F (36.8 C) (Oral)  Wt 190 lb (86.183 kg)  SpO2 98%  Body mass  index is 35.92 kg/(m^2).  GENERAL: vitals reviewed and listed above, alert, oriented, appears well hydrated and in no acute distress gait minimally antalgic  HEENT: atraumatic, conjunctiva  clear, no obvious abnormalities on inspection of external nose and ears NECK: no obvious masses on inspection palpation  LUNGS: clear to auscultation bilaterally, no wheezes, rales or rhonchi, good air movement CV: HRRR, no clubbing cyanosis   lle  Much less swollen  Less tenderness and firmness  No ulcerations or redness. PSYCH: pleasant and cooperative, no obvious depression or anxiety cognition intact here with daughter   ASSESSMENT AND PLAN:  Discussed the following assessment and plan:  Acute superficial venous thrombosis of left lower extremity - sx much improved continue daily use  and recheck nov 30 consideration of rescan   about how long to remain of lovenox . rx given for thigh high 20- 30 compressi  Varicose veins of leg with complications, bilateral  History of DVT (deep vein thrombosis)  Anxiety state - episode sob increase ativan dose as needed for now caution  Essential hypertension - problematic  on 4 mg cardura for 1 -2weeks  Low blood potassium - Plan: Basic metabolic panel  Renal insufficiency - Plan: Uric acid, Protein / Creatinine Ratio, Urine  Need for TD vaccine - Plan: Td vaccine greater than or equal to 7yo preservative free IM May not be ready to "work" ok to go to [employer] on med.  Pt eels sob issues are anxiety and not lung issues   Observe closely and evaluate further if atypical ( has some underlying hx of bronchospasm.  Neg cards opinion  Not typical of PE )  Pulse ox is good at this time.  -Patient advised to return or notify health care team  if symptoms worsen ,persist or new concerns arise.  Patient Instructions  Increase   Dose of anxiety medication   Lorazepam and can take twice a day if needed for anxiety. Continue  lovenox  For now   Leg looks  much better  Recheck in   Week of november 30 and we may end of getting a repeat doppler . At that time to decide on how long to stay on the blood thinner .  Compression stockings  .    To be fit  For this .  To wear every day and help the swelling . bp still up 150 range  We may need t consider other changes  Also .     Standley Brooking. Majel Giel M.D.

## 2014-04-04 ENCOUNTER — Other Ambulatory Visit: Payer: Self-pay | Admitting: Internal Medicine

## 2014-04-04 LAB — PROTEIN / CREATININE RATIO, URINE: CREATININE, URINE: 16.8 mg/dL

## 2014-04-06 ENCOUNTER — Ambulatory Visit: Payer: Medicare Other | Admitting: Internal Medicine

## 2014-04-06 NOTE — Telephone Encounter (Signed)
Should the pt return to regular dosing?

## 2014-04-06 NOTE — Telephone Encounter (Signed)
Pt following up on refill request potassium chloride (K-DUR) 10 MEQ tablet Pt is out of this med/ [facility]

## 2014-04-07 ENCOUNTER — Ambulatory Visit: Payer: Medicare Other | Admitting: Internal Medicine

## 2014-04-07 ENCOUNTER — Other Ambulatory Visit: Payer: Self-pay | Admitting: Internal Medicine

## 2014-04-07 ENCOUNTER — Telehealth: Payer: Self-pay | Admitting: Internal Medicine

## 2014-04-07 NOTE — Telephone Encounter (Signed)
We can increase the cardura to 6 mg at night  If bp not coming down  Below 150  In the next week on 4 mg  .

## 2014-04-07 NOTE — Telephone Encounter (Signed)
Ok to stay on 4 per day for another month.( 40 meq )   We can increase the cardura to 6 mg at night  If bp not coming down  In the next week .  better  In another week

## 2014-04-07 NOTE — Telephone Encounter (Signed)
[name] has been checking Ms Seratt bp and even with the change her bp has still be high. Bp still running around  150- 60 162-64

## 2014-04-08 MED ORDER — DOXAZOSIN MESYLATE 2 MG PO TABS: 6.0000 mg | ORAL_TABLET | Freq: Every day | ORAL | Status: DC

## 2014-04-08 NOTE — Telephone Encounter (Signed)
Pt notified to take 6 mg.  Has an appt on 04/13/14

## 2014-04-13 ENCOUNTER — Ambulatory Visit (HOSPITAL_COMMUNITY)
Admission: RE | Admit: 2014-04-13 | Discharge: 2014-04-13 | Disposition: A | Discharge status: Home / Self Care | Payer: Medicare Other | Source: Ambulatory Visit | Attending: Internal Medicine | Admitting: Internal Medicine

## 2014-04-13 ENCOUNTER — Ambulatory Visit (INDEPENDENT_AMBULATORY_CARE_PROVIDER_SITE_OTHER): Payer: Medicare Other | Admitting: Internal Medicine

## 2014-04-13 ENCOUNTER — Encounter: Payer: Self-pay | Admitting: Internal Medicine

## 2014-04-13 ENCOUNTER — Ambulatory Visit (HOSPITAL_COMMUNITY): Payer: Medicare Other

## 2014-04-13 ENCOUNTER — Other Ambulatory Visit (HOSPITAL_COMMUNITY): Payer: Self-pay | Admitting: Cardiology

## 2014-04-13 VITALS — BP 160/56 | HR 74 | Temp 98.0°F | Ht 61.0 in | Wt 198.3 lb

## 2014-04-13 DIAGNOSIS — R05 Cough: Secondary | ICD-10-CM | POA: Diagnosis not present

## 2014-04-13 DIAGNOSIS — R0602 Shortness of breath: Secondary | ICD-10-CM

## 2014-04-13 DIAGNOSIS — J45901 Unspecified asthma with (acute) exacerbation: Secondary | ICD-10-CM

## 2014-04-13 DIAGNOSIS — R7989 Other specified abnormal findings of blood chemistry: Secondary | ICD-10-CM

## 2014-04-13 DIAGNOSIS — E790 Hyperuricemia without signs of inflammatory arthritis and tophaceous disease: Secondary | ICD-10-CM

## 2014-04-13 DIAGNOSIS — M479 Spondylosis, unspecified: Secondary | ICD-10-CM | POA: Diagnosis not present

## 2014-04-13 DIAGNOSIS — I82812 Embolism and thrombosis of superficial veins of left lower extremities: Secondary | ICD-10-CM

## 2014-04-13 DIAGNOSIS — Z86718 Personal history of other venous thrombosis and embolism: Secondary | ICD-10-CM | POA: Insufficient documentation

## 2014-04-13 DIAGNOSIS — I10 Essential (primary) hypertension: Secondary | ICD-10-CM

## 2014-04-13 DIAGNOSIS — N289 Disorder of kidney and ureter, unspecified: Secondary | ICD-10-CM

## 2014-04-13 DIAGNOSIS — I809 Phlebitis and thrombophlebitis of unspecified site: Secondary | ICD-10-CM

## 2014-04-13 DIAGNOSIS — F411 Generalized anxiety disorder: Secondary | ICD-10-CM

## 2014-04-13 DIAGNOSIS — J45909 Unspecified asthma, uncomplicated: Secondary | ICD-10-CM | POA: Insufficient documentation

## 2014-04-13 HISTORY — DX: Hyperuricemia without signs of inflammatory arthritis and tophaceous disease: E79.0

## 2014-04-13 MED ORDER — PREDNISONE 20 MG PO TABS: ORAL_TABLET | ORAL | Status: DC

## 2014-04-13 MED ORDER — TECHNETIUM TO 99M ALBUMIN AGGREGATED
6.0000 | Freq: Once | INTRAVENOUS | Status: AC | PRN
  Administered 2014-04-13: 6 via INTRAVENOUS

## 2014-04-13 MED ORDER — TECHNETIUM TC 99M DIETHYLENETRIAME-PENTAACETIC ACID: 40.0000 | Freq: Once | INTRAVENOUS | Status: AC | PRN

## 2014-04-13 NOTE — Patient Instructions (Addendum)
Stay on lovenox until doppler completed. BP   Continue same for now.  Some better  Kidney function  About the same  We will get  Lung scan and  Pulmonary to see you also . To help with the shortness of breath issues. If x ray   Ok then we may call in 5 days ofd prednisone.  Oxygen level is good today .  Sty on your inhalers and can use the nebuloizer every 6 hours f needed  In the interim.

## 2014-04-13 NOTE — Progress Notes (Signed)
Pre visit review using our clinic review tool, if applicable. No additional management support is needed unless otherwise documented below in the visit note.  Chief Complaint  Patient presents with  . Follow-up    leg  bp has sob    HPI: [name] 80 y.o. with recent hx of  Sig superficial vt left leg and hx of dvt right leg on low dose lovenox   Here for fu as dicussed here with daughter and qd   Leg better  ordered compression stockings haven't gotten them yet .   also given ativan higher dose for anxiety sx  Some  Help   Having some sob    Has asthma and uses advair and prn albuterol neb about 1-2 x per day some help  Thinks she could be getting a cold  No fever   Ht never greatly controlled  Put on alpha blocker per cardiology and we have increase the dose  No se so far ROS: See pertinent positives and negatives per HPI.  Past Medical History  Diagnosis Date  . Asthma     " onset when young"   . History of DVT (deep vein thrombosis)     "RLE" (09/09/2012) coumadine cant take asa so on plavix   . Allergy   . GERD (gastroesophageal reflux disease)   . Hyperlipidemia   . Hypertension   . Varicose veins   . Lupus     "cured years ago" (09/09/2012)  . Headache(784.0)     "related to my high blood pressure" (09/09/2012)  . Arthritis     "shoulders" (09/09/2012)  . Anxiety   . Anemia     Family History  Problem Relation Age of Onset  . Hypertension Mother   . Hypertension Father   . Cancer Sister     Colon and Breast Cancer  . Hypertension Brother   . Heart attack Daughter   . Lung cancer      both parents     History   Social History  . Marital Status: Single    Spouse Name: N/A    Number of Children: N/A  . Years of Education: N/A   Social History Main Topics  . Smoking status: Never Smoker   . Smokeless tobacco: Never Used  . Alcohol Use: No  . Drug Use: No  . Sexual Activity: No   Other Topics Concern  . None   Social History Narrative   2 people living in the home.  Grand daughter.   Up and down through the night      Had 7 children  2 deceased . 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    works 3 days per week currently .   Glasses dentures  Neg tad     Outpatient Encounter Prescriptions as of 04/13/2014  Medication Sig  . ADVAIR DISKUS 250-50 MCG/DOSE AEPB USE 1 PUFF TWICE DAILY.  [name] albuterol (PROVENTIL) (2.5 MG/3ML) 0.083% nebulizer solution Take 3 mLs (2.5 mg total) by nebulization every 6 (six) hours as needed. For wheezing  . clopidogrel (PLAVIX) 75 MG tablet TAKE 1 TABLET ONCE DAILY. Hold wile on lovenox  . doxazosin (CARDURA) 2 MG tablet Take 3 tablets (6 mg total) by mouth at bedtime. Daily in the evening  . enoxaparin (LOVENOX) 40 MG/0.4ML injection Inject 0.4 mLs (40 mg total) into the skin daily.  . fenofibrate 160 MG  tablet TAKE 1 TABLET ONCE DAILY.  . ferrous sulfate 325 (65 FE) MG EC tablet Take 1 tablet (325 mg total) by mouth daily with breakfast.  . furosemide (LASIX) 20 MG tablet TAKE 1 TABLET EACH DAY.  . LORazepam (ATIVAN) 1 MG tablet Take 1 tablet (1 mg total) by mouth every 8 (eight) hours as needed for anxiety.  . metoprolol succinate (TOPROL-XL) 50 MG 24 hr tablet TAKE 1 TABLET ONCE DAILY WITH FOOD.  [name] montelukast (SINGULAIR) 10 MG tablet TAKE 1 TABLET ONCE DAILY.  . pantoprazole (PROTONIX) 40 MG tablet TAKE 1 TABLET ONCE DAILY.  [name] potassium chloride (K-DUR) 10 MEQ tablet Take 4 tablets (40 mEq total) by mouth daily. For one month  . TRIBENZOR 40-10-25 MG TABS TAKE 1 TABLET ONCE DAILY.  . vitamin B-12 (CYANOCOBALAMIN) 1000 MCG tablet Take 1,000 mcg by mouth daily.  . predniSONE (DELTASONE) 20 MG tablet Take 3 po qd for 2 days then 2 po qd for 3 days,or as directed    EXAM:  BP 160/56 mmHg  Pulse 74  Temp(Src) 98 F (36.7 C) (Oral)  Ht 5\' 1"  (1.549 m)  Wt 198 lb 4.8 oz (89.948 kg)  BMI 37.49 kg/m2  SpO2  97%  Body mass index is 37.49 kg/(m^2).  GENERAL: vitals reviewed and listed above, alert, oriented, appears well hydrated and in no acute distress ocass throat clearing  No dyspnea at rest  Points to upper mid chest when disc SOB  HEENT: atraumatic, conjunctiva  clear, no obvious abnormalities on inspection of external nose and ears OP : no lesion edema or exudate  NECK: no obvious masses on inspection palpation  LUNGS: clear to auscultation bilaterally, no wheezes, rales or rhonchi, good air movement CV: HRRR, no clubbing cyanosis or  peripheral edema nl cap refill  MS: moves all extremities without noticeable focal  abnormality PSYCH: pleasant and cooperative, no obvious depression or anxiety Wt Readings from Last 3 Encounters:  04/13/14 198 lb 4.8 oz (89.948 kg)  04/03/14 190 lb (86.183 kg)  03/27/14 193 lb (87.544 kg)   Lab Results  Component Value Date   WBC 4.7 02/16/2014   HGB 10.5* 02/16/2014   HCT 32.6* 02/16/2014   PLT 230 02/16/2014   GLUCOSE 116* 04/03/2014   CHOL 171 09/09/2012   TRIG 148 09/09/2012   HDL 47 09/09/2012   LDLCALC 94 09/09/2012   ALT 13 02/16/2014   AST 18 02/16/2014   NA 138 04/03/2014   K 3.5 04/03/2014   CL 100 04/03/2014   CREATININE 1.5* 04/03/2014   BUN 29* 04/03/2014   CO2 31 04/03/2014   TSH 1.786 09/09/2012   INR 0.94 12/22/2009  uric aCIDcid was elevated 8.5    ASSESSMENT AND PLAN:  Discussed the following assessment and plan:  Shortness of breath - poss from asthma other  get vq scan and cxray see pulm  add steroid if all neg  - Plan: DG Chest 2 View, NM Pulmonary Perf and Vent, Ambulatory referral to Pulmonology  Acute superficial venous thrombosis of left lower extremity - Plan: DG Chest 2 View, NM Pulmonary Perf and Vent, Ambulatory referral to Pulmonology, CANCELED: Lower Extremity Venous Duplex Bilateral  Asthma, chronic, unspecified asthma severity, with acute exacerbation - Plan: DG Chest 2 View, NM Pulmonary Perf and  Vent, Ambulatory referral to Pulmonology  History of DVT of lower extremity - Plan: DG Chest 2 View, NM Pulmonary Perf and Vent, CANCELED: Lower Extremity Venous Duplex Bilateral  Renal insufficiency creatinine 1.3 -  now 1.5    Essential hypertension  Anxiety state  Elevated uric acid in blood CONSDIER CHANGING TO  clorthalidone watching potassium  -Patient advised to return or notify health care team  if symptoms worsen ,persist or new concerns arise.  Patient Instructions  Stay on lovenox until doppler completed. BP   Continue same for now.  Some better  Kidney function  About the same  We will get  Lung scan and  Pulmonary to see you also . To help with the shortness of breath issues. If x ray   Ok then we may call in 5 days ofd prednisone.  Oxygen level is good today .  Sty on your inhalers and can use the nebuloizer every 6 hours f needed  In the interim.         Standley Brooking. Bambi Fehnel M.D. Of note after pt l;efe for review   Last tsh 2014 can repeat at next labs  Monitor her anemia also

## 2014-04-14 ENCOUNTER — Ambulatory Visit (HOSPITAL_COMMUNITY): Payer: Medicare Other | Attending: Cardiovascular Disease | Admitting: *Deleted

## 2014-04-14 DIAGNOSIS — I82819 Embolism and thrombosis of superficial veins of unspecified lower extremities: Secondary | ICD-10-CM | POA: Diagnosis not present

## 2014-04-14 DIAGNOSIS — I809 Phlebitis and thrombophlebitis of unspecified site: Secondary | ICD-10-CM

## 2014-04-14 NOTE — Progress Notes (Signed)
Left lower extremity venous duplex performed  

## 2014-04-17 ENCOUNTER — Other Ambulatory Visit: Payer: Self-pay | Admitting: Internal Medicine

## 2014-04-17 ENCOUNTER — Institutional Professional Consult (permissible substitution): Payer: Medicare Other | Admitting: Internal Medicine

## 2014-04-17 ENCOUNTER — Telehealth: Payer: Self-pay | Admitting: Internal Medicine

## 2014-04-17 NOTE — Telephone Encounter (Signed)
Sent to the pharmacy by e-scribe. 

## 2014-04-17 NOTE — Telephone Encounter (Signed)
Sent to the pharmacy by e-scribe.  See refill request

## 2014-04-17 NOTE — Telephone Encounter (Signed)
Pt request refill albuterol (PROVENTIL HFA;VENTOLIN HFA) 108 (90 BASE) MCG/ACT inhaler  Pt states she does not want to have to go all weekend w/out. [place]

## 2014-04-17 NOTE — Telephone Encounter (Signed)
Ok to refill x 3 monnths but thought we already did this.

## 2014-04-20 NOTE — Telephone Encounter (Signed)
Declined.  Filled on 04/08/14

## 2014-04-27 ENCOUNTER — Other Ambulatory Visit (INDEPENDENT_AMBULATORY_CARE_PROVIDER_SITE_OTHER): Payer: Medicare Other

## 2014-04-27 ENCOUNTER — Encounter: Payer: Self-pay | Admitting: Internal Medicine

## 2014-04-27 ENCOUNTER — Ambulatory Visit (INDEPENDENT_AMBULATORY_CARE_PROVIDER_SITE_OTHER): Payer: Medicare Other | Admitting: Internal Medicine

## 2014-04-27 VITALS — BP 120/56 | HR 65 | Ht 61.0 in | Wt 201.4 lb

## 2014-04-27 DIAGNOSIS — J4530 Mild persistent asthma, uncomplicated: Secondary | ICD-10-CM

## 2014-04-27 DIAGNOSIS — R0600 Dyspnea, unspecified: Secondary | ICD-10-CM | POA: Insufficient documentation

## 2014-04-27 HISTORY — DX: Dyspnea, unspecified: R06.00

## 2014-04-27 LAB — CBC WITH DIFFERENTIAL/PLATELET
BASOS ABS: 0 10*3/uL (ref 0.0–0.1)
BASOS PCT: 0.4 % (ref 0.0–3.0)
Eosinophils Absolute: 0.2 10*3/uL (ref 0.0–0.7)
Eosinophils Relative: 2.9 % (ref 0.0–5.0)
HEMATOCRIT: 32.7 % — AB (ref 36.0–46.0)
Hemoglobin: 10.4 g/dL — ABNORMAL LOW (ref 12.0–15.0)
LYMPHS ABS: 1.4 10*3/uL (ref 0.7–4.0)
Lymphocytes Relative: 25.1 % (ref 12.0–46.0)
MCHC: 31.7 g/dL (ref 30.0–36.0)
MCV: 87.1 fl (ref 78.0–100.0)
Monocytes Absolute: 0.3 10*3/uL (ref 0.1–1.0)
Monocytes Relative: 5.5 % (ref 3.0–12.0)
NEUTROS ABS: 3.6 10*3/uL (ref 1.4–7.7)
Neutrophils Relative %: 66.1 % (ref 43.0–77.0)
Platelets: 183 10*3/uL (ref 150.0–400.0)
RBC: 3.75 Mil/uL — AB (ref 3.87–5.11)
RDW: 14.4 % (ref 11.5–15.5)
WBC: 5.5 10*3/uL (ref 4.0–10.5)

## 2014-04-27 LAB — TSH: TSH: 1.99 u[IU]/mL (ref 0.35–4.50)

## 2014-04-27 LAB — BRAIN NATRIURETIC PEPTIDE: Pro B Natriuretic peptide (BNP): 62 pg/mL (ref 0.0–100.0)

## 2014-04-27 MED ORDER — FLUTICASONE FUROATE-VILANTEROL 100-25 MCG/INH IN AEPB: 1.0000 | INHALATION_SPRAY | Freq: Every morning | RESPIRATORY_TRACT | Status: DC

## 2014-04-27 MED ORDER — PREDNISONE 10 MG PO TABS: ORAL_TABLET | ORAL | Status: DC

## 2014-04-27 NOTE — Patient Instructions (Addendum)
Work on inhaler technique:  relax and gently blow all the way out then take a nice smooth deep breath back in, triggering the inhaler at same time you start breathing in.  Hold for up to 5 seconds if you can.  Rinse and gargle with water when done  BREO one click each am - take two puffs to be sure you got it all   Prednisone 10 mg take  4 each am x 2 days,   2 each am x 2 days,  1 each am x 2 days and stop   Protonix 40 mg Take 30-60 min before first meal of the day and take Pepcid 20 mg one at bedtime until return  GERD (REFLUX)  is an extremely common cause of respiratory symptoms just like yours , many times with no obvious heartburn at all.    It can be treated with medication, but also with lifestyle changes including avoidance of late meals, excessive alcohol, smoking cessation, and avoid fatty foods, chocolate, peppermint, colas, red wine, and acidic juices such as orange juice.  NO MINT OR MENTHOL PRODUCTS SO NO COUGH DROPS  USE SUGARLESS CANDY INSTEAD (Jolley ranchers or Stover's or Life Savers) or even ice chips will also do - the key is to swallow to prevent all throat clearing. NO OIL BASED VITAMINS - use powdered substitutes.    Please remember to go to the lab   department downstairs for your tests - we will call you with the results when they are available.

## 2014-04-27 NOTE — Progress Notes (Signed)
Subjective:    Patient ID: [name], female    DOB: 12/23/1933      MRN: [id number]  HPI  80 yobf with life long asthma on advair but not taking it consistently and on multiple forms of saba chronically referred by Dr Fabian Sharp to pulmonary clinic for sob on 04/27/14   04/27/2014 1st [facility] office visit/ [name]   Chief Complaint  Patient presents with  . Pulmonary Consult    Referred by Dr. Fabian Sharp. Pt c/o SOB with or without any exertion for the past 2 months. Sometimes gets SOB just walking from room to room at home.  She also c/o cough- prod with clear sputum after taking neb tx. She is using neb about twice daily.   back using advair at hs but still using saba each am in hfa and neb both up to total of 8x daily at most and never less than twice daily  Sleeping ok but does occ wake up around 4 and need another neb with some am cough p neb    No obvious day to day or daytime variabilty or assoc chronic cough or cp or chest tightness, subjective wheeze overt sinus or hb symptoms. No unusual exp hx or h/o childhood pna/ asthma or knowledge of premature birth.  Sleeping ok without nocturnal  or early am exacerbation  of respiratory  c/o's or need for noct saba. Also denies any obvious fluctuation of symptoms with weather or environmental changes or other aggravating or alleviating factors except as outlined above   Current Medications, Allergies, Complete Past Medical History, Past Surgical History, Family History, and Social History were reviewed in [employer] record.  ROS  The following are not active complaints unless bolded sore throat, dysphagia, dental problems, itching, sneezing,  nasal congestion or excess/ purulent secretions, ear ache,   fever, chills, sweats, unintended wt loss, pleuritic or exertional cp, hemoptysis,  orthopnea pnd or leg swelling, presyncope, palpitations, heartburn, abdominal pain, anorexia, nausea, vomiting, diarrhea  or  change in bowel or urinary habits, change in stools or urine, dysuria,hematuria,  rash, arthralgias, visual complaints, headache, numbness weakness or ataxia or problems with walking or coordination,  change in mood/affect or memory.        Review of Systems  Constitutional: Negative for fever, chills and unexpected weight change.  HENT: Positive for congestion. Negative for dental problem, ear pain, nosebleeds, postnasal drip, rhinorrhea, sinus pressure, sneezing, sore throat, trouble swallowing and voice change.   Eyes: Negative for visual disturbance.  Respiratory: Positive for cough and shortness of breath. Negative for choking.   Cardiovascular: Positive for leg swelling. Negative for chest pain.  Gastrointestinal: Negative for vomiting, abdominal pain and diarrhea.  Genitourinary: Negative for difficulty urinating.  Musculoskeletal: Positive for arthralgias.  Skin: Negative for rash.  Neurological: Negative for tremors, syncope and headaches.  Hematological: Does not bruise/bleed easily.       Objective:   Physical Exam  amb hoarse obese wf   Wt Readings from Last 3 Encounters:  04/27/14 201 lb 6.4 oz (91.354 kg)  04/13/14 198 lb 4.8 oz (89.948 kg)  04/03/14 190 lb (86.183 kg)    Vital signs reviewed  HEENT: nl dentition, turbinates, and orophanx. Nl external ear canals without cough reflex   NECK :  without JVD/Nodes/TM/ nl carotid upstrokes bilaterally   LUNGS: no acc muscle use, bilateral end exp wheeze/ pseudowheeze worse with fvc   CV:  RRR  no s3 or murmur or increase in  P2, no edema   ABD:  soft and nontender with nl excursion in the supine position. No bruits or organomegaly, bowel sounds nl  MS:  warm without deformities, calf tenderness, cyanosis or clubbing  SKIN: warm and dry without lesions    NEURO:  alert, approp, no deficits     cxr 04/13/14 No active cardiopulmonary disease. V/q same day matched defects only/ low prob    Chemistry        Component Value Date/Time   NA 138 04/03/2014 1028   NA 141 02/16/2014 1249   K 3.5 04/03/2014 1028   K 4.1 02/16/2014 1249   CL 100 04/03/2014 1028   CO2 31 04/03/2014 1028   CO2 29 02/16/2014 1249   BUN 29* 04/03/2014 1028   BUN 29.8* 02/16/2014 1249   CREATININE 1.5* 04/03/2014 1028   CREATININE 1.7* 02/16/2014 1249      Component Value Date/Time   CALCIUM 10.3 04/03/2014 1028   CALCIUM 9.7 02/16/2014 1249   ALKPHOS 46 02/16/2014 1249   AST 18 02/16/2014 1249   ALT 13 02/16/2014 1249   BILITOT 0.25 02/16/2014 1249      Recent Labs Lab 04/27/14 1129  HGB 10.4*  HCT 32.7*  WBC 5.5  PLT 183.0     Lab Results  Component Value Date   TSH 1.99 04/27/2014     Lab Results  Component Value Date   PROBNP 62.0 04/27/2014              Assessment & Plan:

## 2014-04-28 ENCOUNTER — Other Ambulatory Visit: Payer: Self-pay | Admitting: Internal Medicine

## 2014-04-28 DIAGNOSIS — R0600 Dyspnea, unspecified: Secondary | ICD-10-CM

## 2014-04-28 NOTE — Assessment & Plan Note (Signed)
See asthma ddx

## 2014-04-28 NOTE — Assessment & Plan Note (Addendum)
DDX of  difficult airways management all start with A and  include Adherence, Ace Inhibitors, Acid Reflux, Active Sinus Disease, Alpha 1 Antitripsin deficiency, Anxiety masquerading as Airways dz,  ABPA,  allergy(esp in young), Aspiration (esp in elderly), Adverse effects of DPI,  Active smokers, plus two Bs  = Bronchiectasis and Beta blocker use..and one C= CHF  Adherence is always the initial "prime suspect" and is a multilayered concern that requires a "trust but verify" approach in every patient - starting with knowing how to use medications, especially inhalers, correctly, keeping up with refills and understanding the fundamental difference between maintenance and prns vs those medications only taken for a very short course and then stopped and not refilled.  - not compliant with advair - Try breo each am x 4 weeks then regroup - The proper method of use, as well as anticipated side effects, of a metered-dose inhaler are discussed and demonstrated to the patient. Improved effectiveness after extensive coaching during this visit to a level of approximately  90% with DPI only  ? Acid (or non-acid) GERD > always difficult to exclude as up to 75% of pts in some series report no assoc GI/ Heartburn symptoms> rec max (24h)  acid suppression and diet restrictions/ reviewed and instructions given in writing.   ? Allergy > Prednisone 10 mg take  4 each am x 2 days,   2 each am x 2 days,  1 each am x 2 days and stop   ? Anxiety > usually dx of exclusion but higher on her list  ? A PE > very high risk with sup phlebitis noted and morbid obesity so low threshold to repeat v/q if symptoms change but no evidence of this now  ? chf > excluded by bnp <<100

## 2014-04-29 ENCOUNTER — Telehealth: Payer: Self-pay | Admitting: Hematology

## 2014-04-29 NOTE — Telephone Encounter (Signed)
, °

## 2014-05-04 ENCOUNTER — Other Ambulatory Visit (INDEPENDENT_AMBULATORY_CARE_PROVIDER_SITE_OTHER): Payer: Medicare Other

## 2014-05-04 ENCOUNTER — Other Ambulatory Visit: Payer: Self-pay | Admitting: Internal Medicine

## 2014-05-04 DIAGNOSIS — I10 Essential (primary) hypertension: Secondary | ICD-10-CM

## 2014-05-04 LAB — BASIC METABOLIC PANEL
BUN: 42 mg/dL — AB (ref 6–23)
CALCIUM: 9.2 mg/dL (ref 8.4–10.5)
CO2: 28 meq/L (ref 19–32)
Chloride: 100 mEq/L (ref 96–112)
Creatinine, Ser: 1.8 mg/dL — ABNORMAL HIGH (ref 0.4–1.2)
GFR: 36.12 mL/min — ABNORMAL LOW (ref >60.00)
GLUCOSE: 124 mg/dL — AB (ref 70–99)
Potassium: 4 mEq/L (ref 3.5–5.1)
Sodium: 137 mEq/L (ref 135–145)

## 2014-05-04 NOTE — Telephone Encounter (Signed)
Ok x 6 months 

## 2014-05-05 ENCOUNTER — Other Ambulatory Visit: Payer: Self-pay | Admitting: Internal Medicine

## 2014-05-05 NOTE — Telephone Encounter (Signed)
Sent to the pharmacy by e-scribe. 

## 2014-05-05 NOTE — Telephone Encounter (Signed)
Should patient continue current dose?

## 2014-05-11 ENCOUNTER — Ambulatory Visit: Payer: Medicare Other | Admitting: Internal Medicine

## 2014-05-11 ENCOUNTER — Ambulatory Visit (INDEPENDENT_AMBULATORY_CARE_PROVIDER_SITE_OTHER): Payer: Medicare Other | Admitting: Internal Medicine

## 2014-05-11 ENCOUNTER — Encounter: Payer: Self-pay | Admitting: Internal Medicine

## 2014-05-11 VITALS — BP 140/60 | Temp 98.6°F | Ht 61.0 in | Wt 196.3 lb

## 2014-05-11 DIAGNOSIS — I10 Essential (primary) hypertension: Secondary | ICD-10-CM

## 2014-05-11 DIAGNOSIS — D638 Anemia in other chronic diseases classified elsewhere: Secondary | ICD-10-CM

## 2014-05-11 DIAGNOSIS — I82812 Embolism and thrombosis of superficial veins of left lower extremities: Secondary | ICD-10-CM

## 2014-05-11 DIAGNOSIS — N289 Disorder of kidney and ureter, unspecified: Secondary | ICD-10-CM

## 2014-05-11 DIAGNOSIS — R0602 Shortness of breath: Secondary | ICD-10-CM

## 2014-05-11 DIAGNOSIS — R0600 Dyspnea, unspecified: Secondary | ICD-10-CM

## 2014-05-11 DIAGNOSIS — J45901 Unspecified asthma with (acute) exacerbation: Secondary | ICD-10-CM

## 2014-05-11 DIAGNOSIS — F411 Generalized anxiety disorder: Secondary | ICD-10-CM

## 2014-05-11 NOTE — Patient Instructions (Signed)
Blood pressures is better   So saty on med for now and potassium   No advil aleve  Ibuprofen and  naproxyn .    (This can negatively affect  Your kidney function.) Would use compression stockings and stay on plavix.  Check BMP  And hga1c   again in  1 month and depending on results plan [name].

## 2014-05-11 NOTE — Progress Notes (Signed)
Pre visit review using our clinic review tool, if applicable. No additional management support is needed unless otherwise documented below in the visit note.  Chief Complaint  Patient presents with  . Follow-up    svt, ht labs sob     HPI: [name] 80 y.o. with  Extensive superficial vt  On 3-4 weeks of  lovenox  Comes today for fu   HT up some now on 6 mg cardura at night  Anxiety  Med helps at higher ose  Had sob  Felt to be  Partly form anxiety  Also chornic asthma  Seen by dr Melvyn Novas  And advised change to breo and had rped course   Still has some breathing issues after prednisone . Now off the lovenox   Leg about the same   No increase pain  ROS: See pertinent positives and negatives per HPI.  Past Medical History  Diagnosis Date  . Asthma     " onset when young"   . History of DVT (deep vein thrombosis)     "RLE" (09/09/2012) coumadine cant take asa so on plavix   . Allergy   . GERD (gastroesophageal reflux disease)   . Hyperlipidemia   . Hypertension   . Varicose veins   . Lupus     "cured years ago" (09/09/2012)  . Headache(784.0)     "related to my high blood pressure" (09/09/2012)  . Arthritis     "shoulders" (09/09/2012)  . Anxiety   . Anemia     Family History  Problem Relation Age of Onset  . Hypertension Mother   . Hypertension Father   . Cancer Sister     Colon and Breast Cancer  . Hypertension Brother   . Heart attack Daughter   . Lung cancer      both parents     History   Social History  . Marital Status: Single    Spouse Name: N/A    Number of Children: N/A  . Years of Education: N/A   Social History Main Topics  . Smoking status: Never Smoker   . Smokeless tobacco: Never Used  . Alcohol Use: No  . Drug Use: No  . Sexual Activity: No   Other Topics Concern  . None   Social History Narrative   2 people living in the home.  Grand daughter.   Up and down through the night      Had 7 children  2 deceased . 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    works 3 days per week currently .   Glasses dentures  Neg tad     Outpatient Encounter Prescriptions as of 05/11/2014  Medication Sig  . ADVAIR DISKUS 250-50 MCG/DOSE AEPB USE 1 PUFF TWICE DAILY.  [name] albuterol (PROVENTIL) (2.5 MG/3ML) 0.083% nebulizer solution Take 3 mLs (2.5 mg total) by nebulization every 6 (six) hours as needed. For wheezing  . clopidogrel (PLAVIX) 75 MG tablet TAKE 1 TABLET ONCE DAILY. Hold wile on lovenox  . doxazosin (CARDURA) 2 MG tablet Take 3 tablets (6 mg total) by mouth at bedtime. Daily in the evening  . ferrous sulfate 325 (65 FE) MG EC tablet Take 1 tablet (325 mg total) by mouth daily with breakfast.  . Fluticasone Furoate-Vilanterol (BREO ELLIPTA) 100-25 MCG/INH AEPB Inhale 1 puff into the lungs every morning.  . furosemide (LASIX) 20 MG tablet TAKE  1 TABLET EACH DAY.  . LORazepam (ATIVAN) 1 MG tablet Take 1 tablet (1 mg total) by mouth every 8 (eight) hours as needed for anxiety.  . metoprolol succinate (TOPROL-XL) 50 MG 24 hr tablet TAKE 1 TABLET ONCE DAILY WITH FOOD.  [name] montelukast (SINGULAIR) 10 MG tablet TAKE 1 TABLET ONCE DAILY.  . pantoprazole (PROTONIX) 40 MG tablet TAKE 1 TABLET ONCE DAILY.  [name] PROAIR HFA 108 (90 BASE) MCG/ACT inhaler USE 2 PUFFS EVERY 6 HOURS AS NEEDED FOR WHEEZING.  Jabier Gauss 40-10-25 MG TABS TAKE 1 TABLET ONCE DAILY.  . vitamin B-12 (CYANOCOBALAMIN) 1000 MCG tablet Take 1,000 mcg by mouth daily.  . [DISCONTINUED] fenofibrate 160 MG tablet TAKE 1 TABLET ONCE DAILY.  . [DISCONTINUED] potassium chloride (K-DUR) 10 MEQ tablet Take 4 tablets (40 mEq total) by mouth daily. For one month  . [DISCONTINUED] predniSONE (DELTASONE) 10 MG tablet Take  4 each am x 2 days,   2 each am x 2 days,  1 each am x 2 days and stop    EXAM:  BP 140/60 mmHg  Temp(Src) 98.6 F (37 C) (Oral)  Ht 5\' 1"  (1.549 m)  Wt 196 lb 4.8 oz (89.041 kg)  BMI  37.11 kg/m2  Body mass index is 37.11 kg/(m^2).  GENERAL: vitals reviewed and listed above, alert, oriented, appears well hydrated and in no acute distress HEENT: atraumatic, conjunctiva  clear, no obvious abnormalities on inspection of external nose and ears OP : no lesion edema or exudate  NECK: no obvious masses on inspection palpation  LUNGS: clear to auscultation bilaterally, no wheezes, rales or rhonchi, good air movement CV: HRRR, no clubbing cyanosis or  peripheral edema nl cap refill  MS: moves all extremities without noticeable focal  abnormality PSYCH: pleasant and cooperative, no obvious depression or anxiety Lab Results  Component Value Date   WBC 5.5 04/27/2014   HGB 10.4* 04/27/2014   HCT 32.7* 04/27/2014   PLT 183.0 04/27/2014   GLUCOSE 124* 05/04/2014   CHOL 171 09/09/2012   TRIG 148 09/09/2012   HDL 47 09/09/2012   LDLCALC 94 09/09/2012   ALT 13 02/16/2014   AST 18 02/16/2014   NA 137 05/04/2014   K 4.0 05/04/2014   CL 100 05/04/2014   CREATININE 1.8* 05/04/2014   BUN 42* 05/04/2014   CO2 28 05/04/2014   TSH 1.99 04/27/2014   INR 0.94 12/22/2009   BP Readings from Last 3 Encounters:  05/11/14 140/60  04/27/14 120/56  04/13/14 160/56    .lastcr ASSESSMENT AND PLAN:  Discussed the following assessment and plan:  Essential hypertension -  better  on second read today   Renal insufficiency creatinine 1.3 - up this time to 1.8 follow up in next month  Asthma, chronic, unspecified asthma severity, with acute exacerbation  Anemia of chronic disease  Dyspnea  Shortness of breath  Acute superficial venous thrombosis of left lower extremity - improved   see last doppler   no acute findings   follow   back on plavix  consider other meds when travel and prolonged inactiivty can get advise from heme als  Anxiety state - taking loraz as needed  beneot more than risk at present Consider renal US  If creatinine   And issue  Follow  bp better  First  reading always up.  -Patient advised to return or notify health care team  if symptoms worsen ,persist or new concerns arise.  Patient Instructions  Blood pressures is better  So saty on med for now and potassium   No advil aleve  Ibuprofen and  naproxyn .    (This can negatively affect  Your kidney function.) Would use compression stockings and stay on plavix.  Check BMP  And hga1c   again in  1 month and depending on results plan [name].     Standley Brooking. Beatryce Colombo M.D.  Total visit 56mins > 50% spent counseling and coordinating care

## 2014-05-14 ENCOUNTER — Telehealth: Payer: Self-pay | Admitting: Internal Medicine

## 2014-05-14 MED ORDER — POTASSIUM CHLORIDE ER 10 MEQ PO TBCR: 40.0000 meq | EXTENDED_RELEASE_TABLET | Freq: Every day | ORAL | Status: DC

## 2014-05-14 MED ORDER — FENOFIBRATE 160 MG PO TABS: 160.0000 mg | ORAL_TABLET | Freq: Every day | ORAL | Status: DC

## 2014-05-14 NOTE — Telephone Encounter (Signed)
Pt request refill fenofibrate 160 MG tablet and  potassium chloride (K-DUR) 10 MEQ tablet Pt thought you were going to do this w/ the others  Floyd

## 2014-05-14 NOTE — Telephone Encounter (Signed)
Sent to the pharmacy by e-scribe. 

## 2014-05-18 ENCOUNTER — Ambulatory Visit: Payer: Medicare Other

## 2014-05-18 ENCOUNTER — Other Ambulatory Visit: Payer: Medicare Other

## 2014-05-20 ENCOUNTER — Other Ambulatory Visit: Payer: Self-pay | Admitting: Internal Medicine

## 2014-05-20 NOTE — Telephone Encounter (Signed)
Sent to the pharmacy by e-scribe. 

## 2014-05-25 ENCOUNTER — Encounter: Payer: Self-pay | Admitting: Hematology

## 2014-05-25 ENCOUNTER — Ambulatory Visit (HOSPITAL_BASED_OUTPATIENT_CLINIC_OR_DEPARTMENT_OTHER): Payer: Medicare Other | Admitting: Hematology

## 2014-05-25 ENCOUNTER — Telehealth: Payer: Self-pay | Admitting: Hematology

## 2014-05-25 ENCOUNTER — Other Ambulatory Visit (HOSPITAL_BASED_OUTPATIENT_CLINIC_OR_DEPARTMENT_OTHER): Payer: Medicare Other

## 2014-05-25 VITALS — BP 156/61 | HR 77 | Temp 98.1°F | Resp 18 | Ht 61.0 in | Wt 189.9 lb

## 2014-05-25 DIAGNOSIS — D509 Iron deficiency anemia, unspecified: Secondary | ICD-10-CM

## 2014-05-25 DIAGNOSIS — I10 Essential (primary) hypertension: Secondary | ICD-10-CM | POA: Diagnosis not present

## 2014-05-25 DIAGNOSIS — Z86718 Personal history of other venous thrombosis and embolism: Secondary | ICD-10-CM | POA: Diagnosis not present

## 2014-05-25 DIAGNOSIS — D649 Anemia, unspecified: Secondary | ICD-10-CM

## 2014-05-25 DIAGNOSIS — N183 Chronic kidney disease, stage 3 (moderate): Secondary | ICD-10-CM

## 2014-05-25 LAB — CBC WITH DIFFERENTIAL/PLATELET
BASO%: 0.9 % (ref 0.0–2.0)
Basophils Absolute: 0 10*3/uL (ref 0.0–0.1)
EOS%: 2.7 % (ref 0.0–7.0)
Eosinophils Absolute: 0.1 10*3/uL (ref 0.0–0.5)
HEMATOCRIT: 33.4 % — AB (ref 34.8–46.6)
HGB: 10.5 g/dL — ABNORMAL LOW (ref 11.6–15.9)
LYMPH%: 30.9 % (ref 14.0–49.7)
MCH: 27.6 pg (ref 25.1–34.0)
MCHC: 31.4 g/dL — ABNORMAL LOW (ref 31.5–36.0)
MCV: 87.9 fL (ref 79.5–101.0)
MONO#: 0.4 10*3/uL (ref 0.1–0.9)
MONO%: 9.8 % (ref 0.0–14.0)
NEUT%: 55.7 % (ref 38.4–76.8)
NEUTROS ABS: 2.5 10*3/uL (ref 1.5–6.5)
Platelets: 216 10*3/uL (ref 145–400)
RBC: 3.8 10*6/uL (ref 3.70–5.45)
RDW: 14.1 % (ref 11.2–14.5)
WBC: 4.5 10*3/uL (ref 3.9–10.3)
lymph#: 1.4 10*3/uL (ref 0.9–3.3)

## 2014-05-25 LAB — IRON AND TIBC CHCC
%SAT: 18 % — ABNORMAL LOW (ref 21–57)
IRON: 62 ug/dL (ref 41–142)
TIBC: 336 ug/dL (ref 236–444)
UIBC: 275 ug/dL (ref 120–384)

## 2014-05-25 LAB — FERRITIN CHCC: Ferritin: 75 ng/ml (ref 9–269)

## 2014-05-25 NOTE — Progress Notes (Signed)
[facility] PROGRESS NOTE Date of Visit: 02/16/2014  Angela Dawson, MD [place] [address]  DIAGNOSIS: Anemia, unspecified anemia type - Plan: CBC & Diff and Retic, Comprehensive metabolic panel (Cmet) - [facility], Ferritin, Iron and TIBC [facility]  CHIEF COMPLAIN: follow up anemia   CURRENT THERAPY: Ferrous sulfate 325 mg daily with orange juice and B12 once daily .   INTERVAL HISTORY: She returns for follow up. She feels well overall. She was last seen by Dr. Lona Kettle 3 month ago. She has mild fatigue, which is stable overall, and she is able to function well at home. No chest pain, palpitation, dyspnea or other new symptoms.  She denied any bleeding episodes including hematochezia, melana, hemoptysis, hematuria or epitaxis. No mucosal bleeding or easy bruising.    MEDICAL HISTORY:  Past Medical History  Diagnosis Date  . Asthma     " onset when young"   . History of DVT (deep vein thrombosis)     "RLE" (09/09/2012) coumadine cant take asa so on plavix   . Allergy   . GERD (gastroesophageal reflux disease)   . Hyperlipidemia   . Hypertension   . Varicose veins   . Lupus     "cured years ago" (09/09/2012)  . Headache(784.0)     "related to my high blood pressure" (09/09/2012)  . Arthritis     "shoulders" (09/09/2012)  . Anxiety   . Anemia      ALLERGIES:  is allergic to penicillins and aspirin.  MEDICATIONS:   Medication List       This list is accurate as of: 05/25/14 10:10 AM.  Always use your most recent med list.               ADVAIR DISKUS 250-50 MCG/DOSE Aepb  Generic drug:  Fluticasone-Salmeterol  USE 1 PUFF TWICE DAILY.     clopidogrel 75 MG tablet  Commonly known as:  PLAVIX  - TAKE 1 TABLET ONCE DAILY.  - Hold wile on lovenox     doxazosin 2 MG tablet  Commonly known as:  CARDURA  Take 3 tablets (6 mg total) by mouth at bedtime. Daily in the evening     fenofibrate 160 MG tablet  Take 1 tablet  (160 mg total) by mouth daily.     ferrous sulfate 325 (65 FE) MG EC tablet  Take 1 tablet (325 mg total) by mouth daily with breakfast.     Fluticasone Furoate-Vilanterol 100-25 MCG/INH Aepb  Commonly known as:  BREO ELLIPTA  Inhale 1 puff into the lungs every morning.     furosemide 20 MG tablet  Commonly known as:  LASIX  TAKE 1 TABLET EACH DAY.     LORazepam 1 MG tablet  Commonly known as:  ATIVAN  Take 1 tablet (1 mg total) by mouth every 8 (eight) hours as needed for anxiety.     metoprolol succinate 50 MG 24 hr tablet  Commonly known as:  TOPROL-XL  TAKE 1 TABLET ONCE DAILY WITH FOOD.     montelukast 10 MG tablet  Commonly known as:  SINGULAIR  TAKE 1 TABLET ONCE DAILY.     pantoprazole 40 MG tablet  Commonly known as:  PROTONIX  TAKE 1 TABLET ONCE DAILY.     potassium chloride 10 MEQ tablet  Commonly known as:  K-DUR  Take 4 tablets (40 mEq total) by mouth daily. For one month     PROAIR HFA 108 (90 BASE) MCG/ACT inhaler  Generic drug:  albuterol  USE 2 PUFFS EVERY 6 HOURS AS NEEDED FOR WHEEZING.     albuterol (2.5 MG/3ML) 0.083% nebulizer solution  Commonly known as:  PROVENTIL  USE 1 AMPULE EVERY 6 HOURS AS NEEDED FOR WHEEZE     TRIBENZOR 40-10-25 MG Tabs  Generic drug:  Olmesartan-Amlodipine-HCTZ  TAKE 1 TABLET ONCE DAILY.     vitamin B-12 1000 MCG tablet  Commonly known as:  CYANOCOBALAMIN  Take 1,000 mcg by mouth daily.        SURGICAL HISTORY:  Past Surgical History  Procedure Laterality Date  . Abdominal hysterectomy      partial  . Breast surgery    . Carpal tunnel release Right 2000's  . Shoulder open rotator cuff repair Bilateral 2000's  . Carpal tunnel release Bilateral 10/21/2013    Procedure: BILATERAL  CARPAL TUNNEL RELEASE;  Surgeon: [name], MD;  Location: [facility];  Service: Orthopedics;  Laterality: Bilateral;  . Trigger finger release Right 10/21/2013    Procedure: RELEASE TRIGGER FINGER/A-1 PULLEY RIGHT  MIDDLE AND RIGHT RING;  Surgeon: [name], MD;  Location: [facility];  Service: Orthopedics;  Laterality: Right;    REVIEW OF SYSTEMS:   Constitutional: Denies fevers, chills or abnormal weight loss Eyes: Denies blurriness of vision Ears, nose, mouth, throat, and face: Denies mucositis or sore throat Respiratory: Denies cough, dyspnea or wheezes Cardiovascular: Denies palpitation, chest discomfort or lower extremity swelling Gastrointestinal:  Denies nausea, heartburn or change in bowel habits Skin: Denies abnormal skin rashes Lymphatics: Denies new lymphadenopathy or easy bruising Neurological:Denies numbness, tingling or new weaknesses Behavioral/Psych: Mood is stable, no new changes  All other systems were reviewed with the patient and are negative.  PHYSICAL EXAMINATION: ECOG PERFORMANCE STATUS: 1  Blood pressure 156/61, pulse 77, temperature 98.1 F (36.7 C), temperature source Oral, resp. rate 18, height 5' 1"  (1.549 m), weight 189 lb 14.4 oz (86.138 kg), SpO2 99 %.  GENERAL:alert, no distress and comfortable; elderly female who appears her stated age SKIN: skin color, texture, turgor are normal, no rashes or significant lesions EYES: normal, Conjunctiva are pink and non-injected, sclera clear OROPHARYNX:no exudate, no erythema and lips, buccal mucosa, and tongue normal  NECK: supple, thyroid normal size, non-tender, without nodularity LYMPH:  no palpable lymphadenopathy in the cervical, axillary or supraclavicular LUNGS: clear to auscultation with normal breathing effort HEART: regular rate & rhythm and no murmurs and 1+ lower extremity edema with chronic venous changes ABDOMEN:abdomen soft, non-tender and normal bowel sounds Musculoskeletal:no cyanosis of digits and no clubbing  NEURO: alert & oriented x 3 with fluent speech, no focal motor/sensory deficits EXT: (+) b/l leg pitting edema up to knee    LABORATORY DATA:  Labs:  CBC Latest Ref Rng  05/25/2014 04/27/2014 02/16/2014  WBC 3.9 - 10.3 10e3/uL 4.5 5.5 4.7  Hemoglobin 11.6 - 15.9 g/dL 10.5(L) 10.4(L) 10.5(L)  Hematocrit 34.8 - 46.6 % 33.4(L) 32.7(L) 32.6(L)  Platelets 145 - 400 10e3/uL 216 183.0 230    CMP Latest Ref Rng 05/04/2014 04/03/2014 02/16/2014  Glucose 70 - 99 mg/dL 124(H) 116(H) 113  BUN 6 - 23 mg/dL 42(H) 29(H) 29.8(H)  Creatinine 0.4 - 1.2 mg/dL 1.8(H) 1.5(H) 1.7(H)  Sodium 135 - 145 mEq/L 137 138 141  Potassium 3.5 - 5.1 mEq/L 4.0 3.5 4.1  Chloride 96 - 112 mEq/L 100 100 -  CO2 19 - 32 mEq/L 28 31 29   Calcium 8.4 - 10.5 mg/dL 9.2 10.3 9.7  Total Protein 6.4 - 8.3 g/dL - - 6.9  Total Bilirubin 0.20 - 1.20 mg/dL - - 0.25  Alkaline Phos 40 - 150 U/L - - 46  AST 5 - 34 U/L - - 18  ALT 0 - 55 U/L - - 13      ASSESSMENT: [name] 81 y.o. female with a history of Anemia from CKD and possibly some Myelodysplasia.   PLAN:  1. Anemia likely anemia of chronic disease/kidney disease.  --Her initial   iron study, ferritin,screening SPEP was within normal limits. Both her ESR and CRP was within normal limits. Her TSH screening was negative. Her folate level in April of this year was within normal limits. She is on vitamin B12 with her last level of 1620 in April. Her anemia is likely secondary to her moderate chronic renal disease. -Her anemia is mild, she is asymptomatic, I did not recommend Aranesp at this point. If her hemoglobin drops below 10 or if she develops anemia related symptoms, we would consider Aranesp injection. -Follow-up CBC regularly.  2. Chronic Renal Disease, stage 3 -Stable with creatinine around 1.5-1.8. Patient has been counseled to avoid nephrotoxins and to continue adequate hydration. Patient should continue with tight glycemic control and maintain a good blood pressure to decrease the chance further decline in renal function.   3. GERD/dysphagia.  --Patient had an EGD and a colonoscopy on 03/19/2013 by Dr. Olevia Perches. With iron index and  her ferritin within normal limits, this is less likely to be resulting in her mild anemia. Of note she has a first degree relative with a history of colon cancer. It is as noted in HPI.   4. History of DVT.  --Patient was informed that patients with a history of DVT or more prone to DVT recurrence. she denies any symptoms of swelling in the legs and she reports mobilization. She also denies any acute shortness of breath.   5. Hypertension  -Continue low salt diet and anti-hypertensive medications per PCP.   6. Follow-up  --She will require a repeat CBC and chemistries in 3 months and a follow up at that time.  All questions were answered. The patient knows to call the clinic with any problems, questions or concerns. We can certainly see the patient much sooner if necessary.  I spent 15 minutes counseling the patient face to face. The total time spent in the appointment was 20 minutes.  Truitt Merle  05/25/2014

## 2014-05-25 NOTE — Telephone Encounter (Signed)
gv and printed appt sched and avs fo rpt for July °

## 2014-06-01 ENCOUNTER — Ambulatory Visit: Payer: Medicare Other | Admitting: Internal Medicine

## 2014-06-05 ENCOUNTER — Encounter (HOSPITAL_COMMUNITY): Payer: Self-pay

## 2014-06-05 ENCOUNTER — Emergency Department (HOSPITAL_COMMUNITY)
Admission: EM | Admit: 2014-06-05 | Discharge: 2014-06-05 | Disposition: A | Discharge status: Home / Self Care | Payer: Medicare Other | Attending: Emergency Medicine | Admitting: Emergency Medicine

## 2014-06-05 DIAGNOSIS — F419 Anxiety disorder, unspecified: Secondary | ICD-10-CM | POA: Diagnosis not present

## 2014-06-05 DIAGNOSIS — Y9289 Other specified places as the place of occurrence of the external cause: Secondary | ICD-10-CM | POA: Insufficient documentation

## 2014-06-05 DIAGNOSIS — Z880 Allergy status to penicillin: Secondary | ICD-10-CM | POA: Insufficient documentation

## 2014-06-05 DIAGNOSIS — Z8739 Personal history of other diseases of the musculoskeletal system and connective tissue: Secondary | ICD-10-CM | POA: Insufficient documentation

## 2014-06-05 DIAGNOSIS — J45909 Unspecified asthma, uncomplicated: Secondary | ICD-10-CM | POA: Insufficient documentation

## 2014-06-05 DIAGNOSIS — Z7951 Long term (current) use of inhaled steroids: Secondary | ICD-10-CM | POA: Insufficient documentation

## 2014-06-05 DIAGNOSIS — Z7902 Long term (current) use of antithrombotics/antiplatelets: Secondary | ICD-10-CM | POA: Insufficient documentation

## 2014-06-05 DIAGNOSIS — E785 Hyperlipidemia, unspecified: Secondary | ICD-10-CM | POA: Insufficient documentation

## 2014-06-05 DIAGNOSIS — Z79899 Other long term (current) drug therapy: Secondary | ICD-10-CM | POA: Diagnosis not present

## 2014-06-05 DIAGNOSIS — Y998 Other external cause status: Secondary | ICD-10-CM | POA: Insufficient documentation

## 2014-06-05 DIAGNOSIS — I10 Essential (primary) hypertension: Secondary | ICD-10-CM | POA: Diagnosis not present

## 2014-06-05 DIAGNOSIS — W260XXA Contact with knife, initial encounter: Secondary | ICD-10-CM | POA: Insufficient documentation

## 2014-06-05 DIAGNOSIS — S61011A Laceration without foreign body of right thumb without damage to nail, initial encounter: Secondary | ICD-10-CM | POA: Insufficient documentation

## 2014-06-05 DIAGNOSIS — Z86718 Personal history of other venous thrombosis and embolism: Secondary | ICD-10-CM | POA: Insufficient documentation

## 2014-06-05 DIAGNOSIS — K219 Gastro-esophageal reflux disease without esophagitis: Secondary | ICD-10-CM | POA: Diagnosis not present

## 2014-06-05 DIAGNOSIS — D649 Anemia, unspecified: Secondary | ICD-10-CM | POA: Insufficient documentation

## 2014-06-05 DIAGNOSIS — Y9389 Activity, other specified: Secondary | ICD-10-CM | POA: Insufficient documentation

## 2014-06-05 MED ORDER — OXYCODONE-ACETAMINOPHEN 5-325 MG PO TABS
1.0000 | ORAL_TABLET | Freq: Once | ORAL | Status: AC
  Administered 2014-06-05: 1 via ORAL
  Filled 2014-06-05: qty 1

## 2014-06-05 MED ORDER — LIDOCAINE HCL (PF) 1 % IJ SOLN
5.0000 mL | Freq: Once | INTRAMUSCULAR | Status: AC
  Administered 2014-06-05: 5 mL via INTRADERMAL
  Filled 2014-06-05: qty 5

## 2014-06-05 NOTE — Discharge Instructions (Signed)
Follow-up with PCP in 1 week for suture removal. Return to the ED for new concerns.

## 2014-06-05 NOTE — ED Provider Notes (Signed)
CSN: [id number]     Arrival date & time 06/05/14  1824 History   First MD Initiated Contact with Patient 06/05/14 1827     Chief Complaint  Patient presents with  . Extremity Laceration     (Consider location/radiation/quality/duration/timing/severity/associated sxs/prior Treatment) The history is provided by the patient and medical records.    This is a 81 year old female with past medical history significant for hyperlipidemia, hypertension, GERD, arthritis, presenting to the ED for right thumb laceration. Patient states she was trying to open a can of beans with a knife when it slipped and cut her thumb.  Bleeding has been well controlled but has started developing a hematoma at the base of her thumb. Patient is on daily plavix. Denies numbness/paresthesias of affected finger. Tetanus UTD (was given 2 months ago).    Past Medical History  Diagnosis Date  . Asthma     " onset when young"   . History of DVT (deep vein thrombosis)     "RLE" (09/09/2012) coumadine cant take asa so on plavix   . Allergy   . GERD (gastroesophageal reflux disease)   . Hyperlipidemia   . Hypertension   . Varicose veins   . Lupus     "cured years ago" (09/09/2012)  . Headache(784.0)     "related to my high blood pressure" (09/09/2012)  . Arthritis     "shoulders" (09/09/2012)  . Anxiety   . Anemia    Past Surgical History  Procedure Laterality Date  . Abdominal hysterectomy      partial  . Breast surgery    . Carpal tunnel release Right 2000's  . Shoulder open rotator cuff repair Bilateral 2000's  . Carpal tunnel release Bilateral 10/21/2013    Procedure: BILATERAL  CARPAL TUNNEL RELEASE;  Surgeon: [name], MD;  Location: [facility];  Service: Orthopedics;  Laterality: Bilateral;  . Trigger finger release Right 10/21/2013    Procedure: RELEASE TRIGGER FINGER/A-1 PULLEY RIGHT MIDDLE AND RIGHT RING;  Surgeon: [name], MD;  Location: [facility];  Service:  Orthopedics;  Laterality: Right;   Family History  Problem Relation Age of Onset  . Hypertension Mother   . Hypertension Father   . Cancer Sister     Colon and Breast Cancer  . Hypertension Brother   . Heart attack Daughter   . Lung cancer      both parents    History  Substance Use Topics  . Smoking status: Never Smoker   . Smokeless tobacco: Never Used  . Alcohol Use: No   OB History    Gravida Para Term Preterm AB TAB SAB Ectopic Multiple Living   7 7              Obstetric Comments   Lost 2 children soon after birth     Review of Systems  Skin: Positive for wound.  All other systems reviewed and are negative.     Allergies  Penicillins and Aspirin  Home Medications   Prior to Admission medications   Medication Sig Start Date End Date Taking? Authorizing Provider  ADVAIR DISKUS 250-50 MCG/DOSE AEPB USE 1 PUFF TWICE DAILY.    Burnis Medin, MD  albuterol (PROVENTIL) (2.5 MG/3ML) 0.083% nebulizer solution USE 1 AMPULE EVERY 6 HOURS AS NEEDED FOR WHEEZE 05/20/14   Burnis Medin, MD  clopidogrel (PLAVIX) 75 MG tablet TAKE 1 TABLET ONCE DAILY. Hold wile on lovenox 03/27/14   Burnis Medin, MD  doxazosin (CARDURA) 2 MG tablet Take 3 tablets (6 mg total) by mouth at bedtime. Daily in the evening 04/08/14   Burnis Medin, MD  fenofibrate 160 MG tablet Take 1 tablet (160 mg total) by mouth daily. 05/14/14   Burnis Medin, MD  ferrous sulfate 325 (65 FE) MG EC tablet Take 1 tablet (325 mg total) by mouth daily with breakfast. 05/30/13   Concha Norway, MD  Fluticasone Furoate-Vilanterol (BREO ELLIPTA) 100-25 MCG/INH AEPB Inhale 1 puff into the lungs every morning. 04/27/14   Tanda Rockers, MD  furosemide (LASIX) 20 MG tablet TAKE 1 TABLET EACH DAY.    Burnis Medin, MD  LORazepam (ATIVAN) 1 MG tablet Take 1 tablet (1 mg total) by mouth every 8 (eight) hours as needed for anxiety. 04/03/14   Burnis Medin, MD  metoprolol succinate (TOPROL-XL) 50 MG 24 hr tablet TAKE 1  TABLET ONCE DAILY WITH FOOD. 04/17/14   Burnis Medin, MD  montelukast (SINGULAIR) 10 MG tablet TAKE 1 TABLET ONCE DAILY. 05/05/14   Burnis Medin, MD  pantoprazole (PROTONIX) 40 MG tablet TAKE 1 TABLET ONCE DAILY. 05/05/14   Burnis Medin, MD  potassium chloride (K-DUR) 10 MEQ tablet Take 4 tablets (40 mEq total) by mouth daily. For one month 05/14/14   Burnis Medin, MD  PROAIR HFA 108 (90 BASE) MCG/ACT inhaler USE 2 PUFFS EVERY 6 HOURS AS NEEDED FOR WHEEZING. 04/17/14   Burnis Medin, MD  TRIBENZOR 40-10-25 MG TABS TAKE 1 TABLET ONCE DAILY. 05/05/14   Burnis Medin, MD  vitamin B-12 (CYANOCOBALAMIN) 1000 MCG tablet Take 1,000 mcg by mouth daily.    Historical Provider, MD   BP 149/64 mmHg  Pulse 57  Temp(Src) 98.3 F (36.8 C) (Oral)  Resp 16  SpO2 100%   Physical Exam  Constitutional: She is oriented to person, place, and time. She appears well-developed and well-nourished. No distress.  HENT:  Head: Normocephalic and atraumatic.  Mouth/Throat: Oropharynx is clear and moist.  Eyes: Conjunctivae and EOM are normal. Pupils are equal, round, and reactive to light.  Neck: Normal range of motion. Neck supple.  Cardiovascular: Normal rate, regular rhythm and normal heart sounds.   Pulmonary/Chest: Effort normal and breath sounds normal. No respiratory distress. She has no wheezes.  Abdominal: Soft. Bowel sounds are normal.  Musculoskeletal: Normal range of motion.       Right hand: She exhibits laceration and swelling.       Hands: 1 cm laceration to dorsal aspect of base of right thumb, hematoma noted, wound with continued bleeding but mild at this time; no FB noted; hand remains NVI  Neurological: She is alert and oriented to person, place, and time.  Skin: Skin is warm and dry. She is not diaphoretic.  Psychiatric: She has a normal mood and affect.  Nursing note and vitals reviewed.   ED Course  Procedures (including critical care time)  LACERATION REPAIR Performed by:  Larene Pickett Authorized by: Larene Pickett Consent: Verbal consent obtained. Risks and benefits: risks, benefits and alternatives were discussed Consent given by: patient Patient identity confirmed: provided demographic data Prepped and Draped in normal sterile fashion Wound explored  Laceration Location: base of right thumb  Laceration Length: 1cm  No Foreign Bodies seen or palpated  Anesthesia: local infiltration  Local anesthetic: lidocaine 1% without epinephrine  Anesthetic total: 3 ml  Irrigation method: syringe Amount of cleaning: standard  Skin closure: 4-0 prolene  Number of  sutures: 2  Technique: simple interrupted  Patient tolerance: Patient tolerated the procedure well with no immediate complications.  Labs Review Labs Reviewed - No data to display  Imaging Review No results found.   EKG Interpretation None      MDM   Final diagnoses:  Thumb laceration, right, initial encounter   81 year old female with once a meter laceration to base of right thumb while trying to open a can with a knife. She is on Plavix, blood continues oozing from wound. Tetanus is up-to-date. Laceration repaired as above, patient tolerated well. Pressure dressing applied. Patient follow-up with PCP in one week for suture removal.  Larene Pickett, PA-C 06/05/14 1928  Virgel Manifold, MD 06/05/14 [phone]

## 2014-06-05 NOTE — ED Notes (Signed)
Pt states she was warming some food, and nicked hand with a knife. Pt states she takes Plavix. Small lac to R thumb nuckle. Large hematoma forming at base of finger.

## 2014-06-08 ENCOUNTER — Ambulatory Visit (INDEPENDENT_AMBULATORY_CARE_PROVIDER_SITE_OTHER): Payer: Medicare Other | Admitting: Internal Medicine

## 2014-06-08 ENCOUNTER — Encounter: Payer: Self-pay | Admitting: Internal Medicine

## 2014-06-08 VITALS — BP 138/88 | HR 81 | Ht 61.5 in | Wt 192.0 lb

## 2014-06-08 DIAGNOSIS — J4520 Mild intermittent asthma, uncomplicated: Secondary | ICD-10-CM

## 2014-06-08 DIAGNOSIS — R0600 Dyspnea, unspecified: Secondary | ICD-10-CM | POA: Diagnosis not present

## 2014-06-08 LAB — PULMONARY FUNCTION TEST
DL/VA % PRED: 119 %
DL/VA: 5.35 ml/min/mmHg/L
DLCO unc % pred: 73 %
DLCO unc: 15.27 ml/min/mmHg
FEF 25-75 Post: 0.63 L/sec
FEF 25-75 Pre: 0.69 L/sec
FEF2575-%Change-Post: -8 %
FEF2575-%Pred-Post: 52 %
FEF2575-%Pred-Pre: 57 %
FEV1-%Change-Post: -2 %
FEV1-%Pred-Post: 71 %
FEV1-%Pred-Pre: 73 %
FEV1-Post: 1 L
FEV1-Pre: 1.02 L
FEV1FVC-%Change-Post: -1 %
FEV1FVC-%Pred-Pre: 95 %
FEV6-%Change-Post: -1 %
FEV6-%PRED-POST: 81 %
FEV6-%Pred-Pre: 81 %
FEV6-POST: 1.39 L
FEV6-Pre: 1.41 L
FEV6FVC-%Change-Post: 0 %
FEV6FVC-%PRED-PRE: 105 %
FEV6FVC-%Pred-Post: 105 %
FVC-%Change-Post: 0 %
FVC-%Pred-Post: 77 %
FVC-%Pred-Pre: 77 %
FVC-PRE: 1.41 L
FVC-Post: 1.4 L
POST FEV6/FVC RATIO: 100 %
PRE FEV1/FVC RATIO: 72 %
Post FEV1/FVC ratio: 71 %
Pre FEV6/FVC Ratio: 100 %

## 2014-06-08 NOTE — Patient Instructions (Addendum)
Advair 250 one puff first thing in am and then again about 12 hours later   Only use your albuterol (proair)  as a rescue medication to be used if you can't catch your breath by resting or doing a relaxed purse lip breathing pattern.  - The less you use it, the better it will work when you need it. - Ok to use up to 2 puffs  every 4 hours if you must but call for immediate appointment if use goes up over your usual need - Don't leave home without it !!  (think of it like the spare tire for your car)   Work on inhaler technique:  relax and gently blow all the way out then take a nice smooth deep breath back in, triggering the inhaler at same time you start breathing in.  Hold for up to 5 seconds if you can.  Rinse and gargle with water when done   If not satisfied return asap with all medications in hand but don't take your inhalers that am

## 2014-06-08 NOTE — Progress Notes (Signed)
Subjective:    Patient ID: [name], female    DOB: November 12, 1933      MRN: [id number]    Brief patient profile:  89 yobf with life long asthma on advair but not taking it consistently and on multiple forms of saba chronically referred by Dr Regis Bill to pulmonary clinic for sob on 04/27/14    History of Present Illness  04/27/2014 1st [facility] office visit/ [facility]   Chief Complaint  Patient presents with  . Pulmonary Consult    Referred by Dr. Regis Bill. Pt c/o SOB with or without any exertion for the past 2 months. Sometimes gets SOB just walking from room to room at home.  She also c/o cough- prod with clear sputum after taking neb tx. She is using neb about twice daily.  back using advair at hs but still using saba each am in hfa and neb both up to total of 8x daily at most and never less than twice daily  Sleeping ok but does occ wake up around 4 and need another neb with some am cough p neb   rec Work on inhaler technique BREO one click each am - take two puffs to be sure you got it all  Prednisone 10 mg take  4 each am x 2 days,   2 each am x 2 days,  1 each am x 2 days and stop  Protonix 40 mg Take 30-60 min before first meal of the day and take Pepcid 20 mg one at bedtime until return GERD  Diet      06/08/2014 f/u ov/Wert re: ? Asthma (pfts nl today after am advair and saba)  Chief Complaint  Patient presents with  . Follow-up    PFT done today. Pt states that her breathing is slightly better since the last visit. No new co's today. She has used proair about 2 x per day.   describes variable doe not directly proportional to activity and could not really tell me what activies can/ can't do on best vs worst days "it just depends" ? Not sure but thinks prednisone helped but not doing as well since stopped it.  hfa very poor and not clear she understood it was prn with goal of eliminated dependence but clearly not achieving this on breo aor advair and couldn't even be sure  prednisone reduced her use.  Not having as many noct spells of sob as baseline  / attributes to elevating hob / not clear she's using gerd rx as rec Very easily confused with details of care     No obvious patters in day to day or daytime variabilty or assoc chronic cough or cp or chest tightness, subjective wheeze overt sinus or hb symptoms. No unusual exp hx or h/o childhood pna/ asthma or knowledge of premature birth.  Sleeping ok without nocturnal  or early am exacerbation  of respiratory  c/o's or need for noct saba. Also denies any obvious fluctuation of symptoms with weather or environmental changes or other aggravating or alleviating factors except as outlined above   Current Medications, Allergies, Complete Past Medical History, Past Surgical History, Family History, and Social History were reviewed in [facility] record.  ROS  The following are not active complaints unless bolded sore throat, dysphagia, dental problems, itching, sneezing,  nasal congestion or excess/ purulent secretions, ear ache,   fever, chills, sweats, unintended wt loss, pleuritic or exertional cp, hemoptysis,  orthopnea pnd or leg swelling, presyncope, palpitations,  heartburn, abdominal pain, anorexia, nausea, vomiting, diarrhea  or change in bowel or urinary habits, change in stools or urine, dysuria,hematuria,  rash, arthralgias, visual complaints, headache, numbness weakness or ataxia or problems with walking or coordination,  change in mood/affect or memory.            Objective:   Physical Exam  amb hoarse obese wf  Very depressed affect   Wt Readings from Last 3 Encounters:  04/27/14 201 lb 6.4 oz (91.354 kg)  04/13/14 198 lb 4.8 oz (89.948 kg)  04/03/14 190 lb (86.183 kg)    Vital signs reviewed  HEENT: nl dentition, turbinates, and orophanx. Nl external ear canals without cough reflex   NECK :  without JVD/Nodes/TM/ nl carotid upstrokes bilaterally   LUNGS: no acc  muscle use - lungs clear bilaterally   CV:  RRR  no s3 or murmur or increase in P2, no edema   ABD:  soft and nontender with nl excursion in the supine position. No bruits or organomegaly, bowel sounds nl  MS:  warm without deformities, calf tenderness, cyanosis or clubbing  SKIN: warm and dry without lesions    NEURO:  alert, approp, no deficits     cxr 04/13/14 No active cardiopulmonary disease. V/q same day matched defects only/ low prob    Chemistry      Component Value Date/Time   NA 138 04/03/2014 1028   NA 141 02/16/2014 1249   K 3.5 04/03/2014 1028   K 4.1 02/16/2014 1249   CL 100 04/03/2014 1028   CO2 31 04/03/2014 1028   CO2 29 02/16/2014 1249   BUN 29* 04/03/2014 1028   BUN 29.8* 02/16/2014 1249   CREATININE 1.5* 04/03/2014 1028   CREATININE 1.7* 02/16/2014 1249      Component Value Date/Time   CALCIUM 10.3 04/03/2014 1028   CALCIUM 9.7 02/16/2014 1249   ALKPHOS 46 02/16/2014 1249   AST 18 02/16/2014 1249   ALT 13 02/16/2014 1249   BILITOT 0.25 02/16/2014 1249      Recent Labs Lab 04/27/14 1129  HGB 10.4*  HCT 32.7*  WBC 5.5  PLT 183.0     Lab Results  Component Value Date   TSH 1.99 04/27/2014     Lab Results  Component Value Date   PROBNP 62.0 04/27/2014              Assessment & Plan:

## 2014-06-08 NOTE — Progress Notes (Signed)
PFT done today. 

## 2014-06-08 NOTE — Assessment & Plan Note (Signed)
pfts wnl 06/08/2014 p am advair and saba   The proper method of use, as well as anticipated side effects, of a metered-dose inhaler are discussed and demonstrated to the patient. Improved effectiveness after extensive coaching during this visit to a level of approximately  25% at best and unable to verify she knows how to use dpi either.  Carries dx of asthma and clearly using more saba than intended but I had a great deal of difficulty communicating this to her today and frankly she did not appear at all interested in learning a better approach to treat her symptoms so not clear we can do more here in this clinic than what's been done so far in primary care    Each maintenance medication was reviewed in detail including most importantly the difference between maintenance and as needed and under what circumstances the prns are to be used.  Please see instructions for details which were reviewed in writing and the patient given a copy.    If not satisfied offered to see her back early in am prior to am meds but she'll need to bring all active meds back with her first to verify exactly what she is using.

## 2014-06-11 ENCOUNTER — Other Ambulatory Visit (INDEPENDENT_AMBULATORY_CARE_PROVIDER_SITE_OTHER): Payer: Medicare Other

## 2014-06-11 DIAGNOSIS — R739 Hyperglycemia, unspecified: Secondary | ICD-10-CM

## 2014-06-11 DIAGNOSIS — Z79899 Other long term (current) drug therapy: Secondary | ICD-10-CM | POA: Diagnosis not present

## 2014-06-11 DIAGNOSIS — I10 Essential (primary) hypertension: Secondary | ICD-10-CM

## 2014-06-11 LAB — BASIC METABOLIC PANEL
BUN: 28 mg/dL — ABNORMAL HIGH (ref 6–23)
CALCIUM: 10.1 mg/dL (ref 8.4–10.5)
CO2: 28 mEq/L (ref 19–32)
CREATININE: 1.61 mg/dL — AB (ref 0.40–1.20)
Chloride: 96 mEq/L (ref 96–112)
GFR: 39.76 mL/min — ABNORMAL LOW (ref >60.00)
GLUCOSE: 146 mg/dL — AB (ref 70–99)
Potassium: 4.1 mEq/L (ref 3.5–5.1)
Sodium: 132 mEq/L — ABNORMAL LOW (ref 135–145)

## 2014-06-11 LAB — HEMOGLOBIN A1C: HEMOGLOBIN A1C: 6.8 % — AB (ref 4.6–6.5)

## 2014-06-15 ENCOUNTER — Ambulatory Visit (INDEPENDENT_AMBULATORY_CARE_PROVIDER_SITE_OTHER): Payer: Medicare Other | Admitting: Internal Medicine

## 2014-06-15 ENCOUNTER — Encounter: Payer: Self-pay | Admitting: Internal Medicine

## 2014-06-15 VITALS — BP 154/50 | Temp 99.0°F | Ht 61.5 in | Wt 183.5 lb

## 2014-06-15 DIAGNOSIS — Z4802 Encounter for removal of sutures: Secondary | ICD-10-CM | POA: Diagnosis not present

## 2014-06-15 DIAGNOSIS — R7303 Prediabetes: Secondary | ICD-10-CM

## 2014-06-15 DIAGNOSIS — R0602 Shortness of breath: Secondary | ICD-10-CM | POA: Diagnosis not present

## 2014-06-15 DIAGNOSIS — N289 Disorder of kidney and ureter, unspecified: Secondary | ICD-10-CM

## 2014-06-15 DIAGNOSIS — R7309 Other abnormal glucose: Secondary | ICD-10-CM | POA: Diagnosis not present

## 2014-06-15 DIAGNOSIS — I10 Essential (primary) hypertension: Secondary | ICD-10-CM | POA: Diagnosis not present

## 2014-06-15 DIAGNOSIS — M545 Low back pain, unspecified: Secondary | ICD-10-CM | POA: Insufficient documentation

## 2014-06-15 MED ORDER — DICLOFENAC SODIUM 1 % TD GEL: 4.0000 g | Freq: Four times a day (QID) | TRANSDERMAL | Status: DC

## 2014-06-15 NOTE — Patient Instructions (Addendum)
Antibiotic ointment and local care the swelling should go down with time  Get back with [place] if  Redness or fever.  wil reviewe record about the  shortness of breath but could be  Conditioning asthma  Poss heart issues. You sugar is now early diabetes. Will do a referral to diabetes nutrition.  Avoid  Sweets sugars , breads biscuits  For now.   Will rx voltaren gel for the pain is helps .   Diabetes Mellitus and Food It is important for you to manage your blood sugar (glucose) level. Your blood glucose level can be greatly affected by what you eat. Eating healthier foods in the appropriate amounts throughout the day at about the same time each day will help you control your blood glucose level. It can also help slow or prevent worsening of your diabetes mellitus. Healthy eating may even help you improve the level of your blood pressure and reach or maintain a healthy weight.  HOW CAN FOOD AFFECT ME? Carbohydrates Carbohydrates affect your blood glucose level more than any other type of food. Your dietitian will help you determine how many carbohydrates to eat at each meal and teach you how to count carbohydrates. Counting carbohydrates is important to keep your blood glucose at a healthy level, especially if you are using insulin or taking certain medicines for diabetes mellitus. Alcohol Alcohol can cause sudden decreases in blood glucose (hypoglycemia), especially if you use insulin or take certain medicines for diabetes mellitus. Hypoglycemia can be a life-threatening condition. Symptoms of hypoglycemia (sleepiness, dizziness, and disorientation) are similar to symptoms of having too much alcohol.  If your health care provider has given you approval to drink alcohol, do so in moderation and use the following guidelines:  Women should not have more than one drink per day, and men should not have more than two drinks per day. One drink is equal to:  12 oz of beer.  5 oz of wine.  1 oz of hard  liquor.  Do not drink on an empty stomach.  Keep yourself hydrated. Have water, diet soda, or unsweetened iced tea.  Regular soda, juice, and other mixers might contain a lot of carbohydrates and should be counted. WHAT FOODS ARE NOT RECOMMENDED? As you make food choices, it is important to remember that all foods are not the same. Some foods have fewer nutrients per serving than other foods, even though they might have the same number of calories or carbohydrates. It is difficult to get your body what it needs when you eat foods with fewer nutrients. Examples of foods that you should avoid that are high in calories and carbohydrates but low in nutrients include:  Trans fats (most processed foods list trans fats on the Nutrition Facts label).  Regular soda.  Juice.  Candy.  Sweets, such as cake, pie, doughnuts, and cookies.  Fried foods. WHAT FOODS CAN I EAT? Have nutrient-rich foods, which will nourish your body and keep you healthy. The food you should eat also will depend on several factors, including:  The calories you need.  The medicines you take.  Your weight.  Your blood glucose level.  Your blood pressure level.  Your cholesterol level. You also should eat a variety of foods, including:  Protein, such as meat, poultry, fish, tofu, nuts, and seeds (lean animal proteins are best).  Fruits.  Vegetables.  Dairy products, such as milk, cheese, and yogurt (low fat is best).  Breads, grains, pasta, cereal, rice, and beans.  Fats  such as olive oil, trans fat-free margarine, canola oil, avocado, and olives. DOES EVERYONE WITH DIABETES MELLITUS HAVE THE SAME MEAL PLAN? Because every person with diabetes mellitus is different, there is not one meal plan that works for everyone. It is very important that you meet with a dietitian who will help you create a meal plan that is just right for you. Document Released: 01/26/2005 Document Revised: 05/06/2013 Document Reviewed:  03/28/2013 [facility] Patient Information 2015 [place], [place]. This information is not intended to replace advice given to you by your health care provider. Make sure you discuss any questions you have with your health care provider.

## 2014-06-15 NOTE — Progress Notes (Signed)
Pre visit review using our clinic review tool, if applicable. No additional management support is needed unless otherwise documented below in the visit note.  Chief Complaint  Patient presents with  . Follow-up    HPI: [name] 81 y.o. comesin for fu from ed sp laceration hand .  And other issues  she was trying to open a can of beans with a knife when it slipped and cut her thumb 2 sutores 4 0 prolene no bleeding now but bled a lot at initial no redness or warmth but swollen   Also   Left back pain used tyelnol and voltaren gel in past .  Fu labs renal function and bg  ?still unclear why she is so SOB has seen cards and pulm working dx  underlyung asthma ? Other is anemia also  No bleeding now  ROS: See pertinent positives and negatives per HPI. No cough no syncope   Past Medical History  Diagnosis Date  . Asthma     " onset when young"   . History of DVT (deep vein thrombosis)     "RLE" (09/09/2012) coumadine cant take asa so on plavix   . Allergy   . GERD (gastroesophageal reflux disease)   . Hyperlipidemia   . Hypertension   . Varicose veins   . Lupus     "cured years ago" (09/09/2012)  . Headache(784.0)     "related to my high blood pressure" (09/09/2012)  . Arthritis     "shoulders" (09/09/2012)  . Anxiety   . Anemia     Family History  Problem Relation Age of Onset  . Hypertension Mother   . Hypertension Father   . Cancer Sister     Colon and Breast Cancer  . Hypertension Brother   . Heart attack Daughter   . Lung cancer      both parents     History   Social History  . Marital Status: Single    Spouse Name: N/A    Number of Children: N/A  . Years of Education: N/A   Social History Main Topics  . Smoking status: Never Smoker   . Smokeless tobacco: Never Used  . Alcohol Use: No  . Drug Use: No  . Sexual Activity: No   Other Topics Concern  . None   Social History Narrative   2 people living in the home.  Grand daughter.   Up and down  through the night      Had 7 children  2 deceased . 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    works 3 days per week currently .   Glasses dentures  Neg tad     Outpatient Encounter Prescriptions as of 06/15/2014  Medication Sig  . acetaminophen (TYLENOL) 500 MG tablet Take 1,000 mg by mouth every 6 (six) hours as needed for moderate pain.  [name] ADVAIR DISKUS 250-50 MCG/DOSE AEPB USE 1 PUFF TWICE DAILY.  [name] albuterol (PROVENTIL) (2.5 MG/3ML) 0.083% nebulizer solution USE 1 AMPULE EVERY 6 HOURS AS NEEDED FOR WHEEZE  . clopidogrel (PLAVIX) 75 MG tablet TAKE 1 TABLET ONCE DAILY. Hold wile on lovenox  . doxazosin (CARDURA) 2 MG tablet Take 3 tablets (6 mg total) by mouth at bedtime. Daily in the evening  . fenofibrate 160 MG tablet Take 1 tablet (160 mg total) by mouth daily.  . ferrous sulfate 325 (65 FE) MG  EC tablet Take 1 tablet (325 mg total) by mouth daily with breakfast.  . furosemide (LASIX) 20 MG tablet TAKE 1 TABLET EACH DAY.  [name] guaiFENesin (MUCINEX) 600 MG 12 hr tablet Take 600 mg by mouth 2 (two) times daily as needed for cough or to loosen phlegm.  [name] LORazepam (ATIVAN) 1 MG tablet Take 1 tablet (1 mg total) by mouth every 8 (eight) hours as needed for anxiety.  . metoprolol succinate (TOPROL-XL) 50 MG 24 hr tablet TAKE 1 TABLET ONCE DAILY WITH FOOD.  [name] montelukast (SINGULAIR) 10 MG tablet TAKE 1 TABLET ONCE DAILY.  . pantoprazole (PROTONIX) 40 MG tablet TAKE 1 TABLET ONCE DAILY.  [name] potassium chloride (K-DUR) 10 MEQ tablet Take 4 tablets (40 mEq total) by mouth daily. For one month  . PROAIR HFA 108 (90 BASE) MCG/ACT inhaler USE 2 PUFFS EVERY 6 HOURS AS NEEDED FOR WHEEZING.  Jabier Gauss 40-10-25 MG TABS TAKE 1 TABLET ONCE DAILY.  . vitamin B-12 (CYANOCOBALAMIN) 1000 MCG tablet Take 1,000 mcg by mouth daily.  . diclofenac sodium (VOLTAREN) 1 % GEL Apply 4 g topically 4 (four) times daily.    EXAM:  BP  154/50 mmHg  Temp(Src) 99 F (37.2 C) (Oral)  Ht 5' 1.5" (1.562 m)  Wt 183 lb 8 oz (83.235 kg)  BMI 34.11 kg/m2  Body mass index is 34.11 kg/(m^2).  GENERAL: vitals reviewed and listed above, alert, oriented, appears well hydrated and in no acute distress HEENT: atraumatic, conjunctiva  clear, no obvious abnormalities on inspection of external nose and ears  NECK: no obvious masses on inspection palpation  CV: HRRR, no clubbing cyanosisnl cap refill  MS: moves all extremities  Right nad with 2 sutures removed after soaking scab   Localized swelling likd a fluid collectino under neath the wound but nl rom finger and thumb  No redness warmth.  PSYCH: pleasant and cooperative, no obvious depression or anxiety BP Readings from Last 3 Encounters:  06/15/14 154/50  06/08/14 138/88  06/05/14 141/72   Wt Readings from Last 3 Encounters:  06/15/14 183 lb 8 oz (83.235 kg)  06/08/14 192 lb (87.091 kg)  05/25/14 189 lb 14.4 oz (86.138 kg)   Lab Results  Component Value Date   WBC 4.5 05/25/2014   HGB 10.5* 05/25/2014   HCT 33.4* 05/25/2014   PLT 216 05/25/2014   GLUCOSE 146* 06/11/2014   CHOL 171 09/09/2012   TRIG 148 09/09/2012   HDL 47 09/09/2012   LDLCALC 94 09/09/2012   ALT 13 02/16/2014   AST 18 02/16/2014   NA 132* 06/11/2014   K 4.1 06/11/2014   CL 96 06/11/2014   CREATININE 1.61* 06/11/2014   BUN 28* 06/11/2014   CO2 28 06/11/2014   TSH 1.99 04/27/2014   INR 0.94 12/22/2009   HGBA1C 6.8* 06/11/2014      ASSESSMENT AND PLAN:  Discussed the following assessment and plan:  Pre-diabetes vs early DM  - reviewed diet  refer for dietary counseling the fu  - Plan: Amb ref to Medical Nutrition Therapy-MNT  Visit for suture removal done elsewhere  Essential hypertension - Plan: Amb ref to Medical Nutrition Therapy-MNT  Shortness of breath - on going poss multifactorial   Renal insufficiency creatinine 1.3  Left low back pain, with sciatica presence unspecified -  refill voltaren  has been used in past  -Patient advised to return or notify health care team  if symptoms worsen ,persist or new concerns arise.  Patient Instructions  Antibiotic  ointment and local care the swelling should go down with time  Get back with [place] if  Redness or fever.  wil reviewe record about the  shortness of breath but could be  Conditioning asthma  Poss heart issues. You sugar is now early diabetes. Will do a referral to diabetes nutrition.  Avoid  Sweets sugars , breads biscuits  For now.   Will rx voltaren gel for the pain is helps .   Diabetes Mellitus and Food It is important for you to manage your blood sugar (glucose) level. Your blood glucose level can be greatly affected by what you eat. Eating healthier foods in the appropriate amounts throughout the day at about the same time each day will help you control your blood glucose level. It can also help slow or prevent worsening of your diabetes mellitus. Healthy eating may even help you improve the level of your blood pressure and reach or maintain a healthy weight.  HOW CAN FOOD AFFECT ME? Carbohydrates Carbohydrates affect your blood glucose level more than any other type of food. Your dietitian will help you determine how many carbohydrates to eat at each meal and teach you how to count carbohydrates. Counting carbohydrates is important to keep your blood glucose at a healthy level, especially if you are using insulin or taking certain medicines for diabetes mellitus. Alcohol Alcohol can cause sudden decreases in blood glucose (hypoglycemia), especially if you use insulin or take certain medicines for diabetes mellitus. Hypoglycemia can be a life-threatening condition. Symptoms of hypoglycemia (sleepiness, dizziness, and disorientation) are similar to symptoms of having too much alcohol.  If your health care provider has given you approval to drink alcohol, do so in moderation and use the following guidelines:  Women  should not have more than one drink per day, and men should not have more than two drinks per day. One drink is equal to:  12 oz of beer.  5 oz of wine.  1 oz of hard liquor.  Do not drink on an empty stomach.  Keep yourself hydrated. Have water, diet soda, or unsweetened iced tea.  Regular soda, juice, and other mixers might contain a lot of carbohydrates and should be counted. WHAT FOODS ARE NOT RECOMMENDED? As you make food choices, it is important to remember that all foods are not the same. Some foods have fewer nutrients per serving than other foods, even though they might have the same number of calories or carbohydrates. It is difficult to get your body what it needs when you eat foods with fewer nutrients. Examples of foods that you should avoid that are high in calories and carbohydrates but low in nutrients include:  Trans fats (most processed foods list trans fats on the Nutrition Facts label).  Regular soda.  Juice.  Candy.  Sweets, such as cake, pie, doughnuts, and cookies.  Fried foods. WHAT FOODS CAN I EAT? Have nutrient-rich foods, which will nourish your body and keep you healthy. The food you should eat also will depend on several factors, including:  The calories you need.  The medicines you take.  Your weight.  Your blood glucose level.  Your blood pressure level.  Your cholesterol level. You also should eat a variety of foods, including:  Protein, such as meat, poultry, fish, tofu, nuts, and seeds (lean animal proteins are best).  Fruits.  Vegetables.  Dairy products, such as milk, cheese, and yogurt (low fat is best).  Breads, grains, pasta, cereal, rice, and beans.  Fats such  as olive oil, trans fat-free margarine, canola oil, avocado, and olives. DOES EVERYONE WITH DIABETES MELLITUS HAVE THE SAME MEAL PLAN? Because every person with diabetes mellitus is different, there is not one meal plan that works for everyone. It is very important  that you meet with a dietitian who will help you create a meal plan that is just right for you. Document Released: 01/26/2005 Document Revised: 05/06/2013 Document Reviewed: 03/28/2013 [facility] Patient Information 2015 [place], [place]. This information is not intended to replace advice given to you by your health care provider. Make sure you discuss any questions you have with your health care provider.      Standley Brooking. Panosh M.D.

## 2014-06-16 ENCOUNTER — Telehealth: Payer: Self-pay | Admitting: Internal Medicine

## 2014-06-16 NOTE — Telephone Encounter (Signed)
emmi emailed °

## 2014-06-18 ENCOUNTER — Other Ambulatory Visit: Payer: Self-pay

## 2014-06-18 NOTE — Telephone Encounter (Signed)
Rx request for Lorazepam 1 mg tablet-Take 1 tablet every 8 hours as needed for anxiety #24  Pharm:  [facility].

## 2014-06-22 ENCOUNTER — Telehealth: Payer: Self-pay | Admitting: Family Medicine

## 2014-06-22 DIAGNOSIS — E099 Drug or chemical induced diabetes mellitus without complications: Secondary | ICD-10-CM

## 2014-06-22 MED ORDER — LORAZEPAM 1 MG PO TABS: 1.0000 mg | ORAL_TABLET | Freq: Three times a day (TID) | ORAL | Status: DC | PRN

## 2014-06-22 NOTE — Telephone Encounter (Signed)
Melissa from nutrition is calling to say that medicare will not pay for counseling unless she has a dx code of diabetes.  Nutritionist has reviewed the lab results and thinks she qualifies as diabetic.  Okay to change dx code?

## 2014-06-22 NOTE — Telephone Encounter (Signed)
Called to the pharmacy and left on machine. 

## 2014-06-22 NOTE — Telephone Encounter (Signed)
Ok to change to new dx DM. Based on lab  Lab Results  Component Value Date   HGBA1C 6.8* 06/11/2014

## 2014-06-22 NOTE — Telephone Encounter (Signed)
Ok to refill x 1  

## 2014-06-23 NOTE — Telephone Encounter (Signed)
New referral placed in the system with diagnosis of diabetes.

## 2014-06-26 ENCOUNTER — Encounter: Payer: Self-pay | Admitting: Internal Medicine

## 2014-06-29 ENCOUNTER — Encounter: Payer: Self-pay | Admitting: Internal Medicine

## 2014-06-29 ENCOUNTER — Ambulatory Visit (INDEPENDENT_AMBULATORY_CARE_PROVIDER_SITE_OTHER): Payer: Medicare Other | Admitting: Internal Medicine

## 2014-06-29 VITALS — BP 154/60 | HR 75 | Temp 98.3°F | Ht 61.5 in | Wt 182.2 lb

## 2014-06-29 DIAGNOSIS — N289 Disorder of kidney and ureter, unspecified: Secondary | ICD-10-CM | POA: Diagnosis not present

## 2014-06-29 DIAGNOSIS — I10 Essential (primary) hypertension: Secondary | ICD-10-CM | POA: Diagnosis not present

## 2014-06-29 DIAGNOSIS — R7309 Other abnormal glucose: Secondary | ICD-10-CM | POA: Diagnosis not present

## 2014-06-29 DIAGNOSIS — H905 Unspecified sensorineural hearing loss: Secondary | ICD-10-CM

## 2014-06-29 DIAGNOSIS — R7303 Prediabetes: Secondary | ICD-10-CM

## 2014-06-29 DIAGNOSIS — R42 Dizziness and giddiness: Secondary | ICD-10-CM

## 2014-06-29 DIAGNOSIS — I1A0 Resistant hypertension: Secondary | ICD-10-CM | POA: Insufficient documentation

## 2014-06-29 DIAGNOSIS — H9190 Unspecified hearing loss, unspecified ear: Secondary | ICD-10-CM | POA: Insufficient documentation

## 2014-06-29 LAB — GLUCOSE, POCT (MANUAL RESULT ENTRY): POC Glucose: 123 mg/dl — AB (ref 70–99)

## 2014-06-29 NOTE — Progress Notes (Signed)
Pre visit review using our clinic review tool, if applicable. No additional management support is needed unless otherwise documented below in the visit note.   Chief Complaint  Patient presents with  . Dizziness    HPI: Patient [name]  comes in today for SDA for  new problem evaluation.Here with daughters . C/O   Dizzy and ears get stopped  Off an on but lasted longer today ,? if postional  Not syncope but says the word lightheaded  Prev comes and goes was worse  today .  daughter with her  Onset  When in kitchen after shower and when got up  Had gone into room and then started . Feeling like off balance .   Feels like hearing stopped up but not tinnitus or roaring . Novision other assoc sx but had some nausea today Got  these  About  1 week or so .  No fever.  Hs of similar in the remote past  rx for infection and got better  (Dr Thornell Mule)  No falling trauma head feels tight  ROS: See pertinent positives and negatives per HPI.denies racing heart and sob is better  took one lorazepam this weekend when was anxious.,  No vomiting diarrhea fever chills   Past Medical History  Diagnosis Date  . Asthma     " onset when young"   . History of DVT (deep vein thrombosis)     "RLE" (09/09/2012) coumadine cant take asa so on plavix   . Allergy   . GERD (gastroesophageal reflux disease)   . Hyperlipidemia   . Hypertension   . Varicose veins   . Lupus     "cured years ago" (09/09/2012)  . Headache(784.0)     "related to my high blood pressure" (09/09/2012)  . Arthritis     "shoulders" (09/09/2012)  . Anxiety   . Anemia     Family History  Problem Relation Age of Onset  . Hypertension Mother   . Hypertension Father   . Cancer Sister     Colon and Breast Cancer  . Hypertension Brother   . Heart attack Daughter   . Lung cancer      both parents     History   Social History  . Marital Status: Single    Spouse Name: N/A  . Number of Children: N/A  . Years of Education: N/A    Social History Main Topics  . Smoking status: Never Smoker   . Smokeless tobacco: Never Used  . Alcohol Use: No  . Drug Use: No  . Sexual Activity: No   Other Topics Concern  . None   Social History Narrative   2 people living in the home.  Grand daughter.   Up and down through the night      Had 7 children  2 deceased . 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    works 3 days per week currently .   Glasses dentures  Neg tad     Outpatient Encounter Prescriptions as of 06/29/2014  Medication Sig  . acetaminophen (TYLENOL) 500 MG tablet Take 1,000 mg by mouth every 6 (six) hours as needed for moderate pain.  [name] ADVAIR DISKUS 250-50 MCG/DOSE AEPB USE 1 PUFF TWICE DAILY.  [name] albuterol (PROVENTIL) (2.5 MG/3ML) 0.083% nebulizer solution USE 1 AMPULE EVERY 6 HOURS AS NEEDED FOR WHEEZE  . clopidogrel (PLAVIX) 75  MG tablet TAKE 1 TABLET ONCE DAILY. Hold wile on lovenox  . diclofenac sodium (VOLTAREN) 1 % GEL Apply 4 g topically 4 (four) times daily.  [name] doxazosin (CARDURA) 2 MG tablet Take 3 tablets (6 mg total) by mouth at bedtime. Daily in the evening  . fenofibrate 160 MG tablet Take 1 tablet (160 mg total) by mouth daily.  . ferrous sulfate 325 (65 FE) MG EC tablet Take 1 tablet (325 mg total) by mouth daily with breakfast.  . furosemide (LASIX) 20 MG tablet TAKE 1 TABLET EACH DAY.  [name] guaiFENesin (MUCINEX) 600 MG 12 hr tablet Take 600 mg by mouth 2 (two) times daily as needed for cough or to loosen phlegm.  [name] LORazepam (ATIVAN) 1 MG tablet Take 1 tablet (1 mg total) by mouth every 8 (eight) hours as needed for anxiety.  . metoprolol succinate (TOPROL-XL) 50 MG 24 hr tablet TAKE 1 TABLET ONCE DAILY WITH FOOD.  [name] montelukast (SINGULAIR) 10 MG tablet TAKE 1 TABLET ONCE DAILY.  . pantoprazole (PROTONIX) 40 MG tablet TAKE 1 TABLET ONCE DAILY.  [name] potassium chloride (K-DUR) 10 MEQ tablet Take 4 tablets (40 mEq  total) by mouth daily. For one month  . PROAIR HFA 108 (90 BASE) MCG/ACT inhaler USE 2 PUFFS EVERY 6 HOURS AS NEEDED FOR WHEEZING.  Jabier Gauss 40-10-25 MG TABS TAKE 1 TABLET ONCE DAILY.  . vitamin B-12 (CYANOCOBALAMIN) 1000 MCG tablet Take 1,000 mcg by mouth daily.    EXAM:  BP 154/60 mmHg  Pulse 75  Temp(Src) 98.3 F (36.8 C) (Oral)  Ht 5' 1.5" (1.562 m)  Wt 182 lb 3.2 oz (82.645 kg)  BMI 33.87 kg/m2  SpO2 98%  Body mass index is 33.87 kg/(m^2).  GENERAL: vitals reviewed and listed above, alert, oriented, appears well hydrated and in no acute distress gets up from cahir independent but cautious gait cautious btu stead  Not wide based  Non focal exam  HEENT: atraumatic, conjunctiva  clear, no obvious abnormalities on inspection of external nose and ears  Mild congestion no face tenderness   tms left nl right nl bony lm ? Clear fluid inferiorly OP : no lesion edema or exudate tongue midline  eoms intact  NECK: no obvious masses on inspection palpation  LUNGS: clear to auscultation bilaterally, no wheezes, rales or rhonchi, good air movement CV: HRRR, no clubbing cyanosisnl cap refill  MS: moves all extremities without noticeable focal  Abnormality DJD   Changes  NEURO: oriented x 3 CN 3-12 appear intact. Hearing not tested  No focal muscle weakness or atrophy.  Grossly non focal. No tremor or abnormal movement. PSYCH: pleasant and cooperative,  Hesitant speech normal  154/62 bp no orthostatic changes  ASSESSMENT AND PLAN:  Discussed the following assessment and plan:  Vertigo - Plan: Ambulatory referral to ENT, POC Glucose (CBG)  Perceived hearing changes - Plan: Ambulatory referral to ENT  Pre-diabetes vs early DM   Renal insufficiency creatinine 1.3  Resistant hypertension - consideration of rearranging meds  of causing sx  Uncertain cause possvestibular  With hx of hearing changes  . No cv sx toady nor palpitations and althgouth she is on much medication is not orthostatic   Caution with the alpha blocker that was rxed initally  By cardiology  Consideration of changing off   To other med ? Hydralazine instead?  She has anxiety but doesn't really seem lik anxiety attack .  -Patient advised to return or notify health care team  if symptoms  worsen ,persist or new concerns arise.  Patient Instructions  Begin claritin 10 mg  Generic  every day  You blood pressure is in the low 152  And doesnt change much  When you stand  this acts like a vertigo   Type of dizziness also .   Doesn't seem like  From  blood sugar .  Send in ent referral to assess  Because of the hearing and vertigo feeling. At rthe same time     Vertigo Vertigo means you feel like you or your surroundings are moving when they are not. Vertigo can be dangerous if it occurs when you are at work, driving, or performing difficult activities.  CAUSES  Vertigo occurs when there is a conflict of signals sent to your brain from the visual and sensory systems in your body. There are many different causes of vertigo, including:  Infections, especially in the inner ear.  A bad reaction to a drug or misuse of alcohol and medicines.  Withdrawal from drugs or alcohol.  Rapidly changing positions, such as lying down or rolling over in bed.  A migraine headache.  Decreased blood flow to the brain.  Increased pressure in the brain from a head injury, infection, tumor, or bleeding. SYMPTOMS  You may feel as though the world is spinning around or you are falling to the ground. Because your balance is upset, vertigo can cause nausea and vomiting. You may have involuntary eye movements (nystagmus). DIAGNOSIS  Vertigo is usually diagnosed by physical exam. If the cause of your vertigo is unknown, your caregiver may perform imaging tests, such as an MRI scan (magnetic resonance imaging). TREATMENT  Most cases of vertigo resolve on their own, without treatment. Depending on the cause, your caregiver may prescribe  certain medicines. If your vertigo is related to body position issues, your caregiver may recommend movements or procedures to correct the problem. In rare cases, if your vertigo is caused by certain inner ear problems, you may need surgery. HOME CARE INSTRUCTIONS   Follow your caregiver's instructions.  Avoid driving.  Avoid operating heavy machinery.  Avoid performing any tasks that would be dangerous to you or others during a vertigo episode.  Tell your caregiver if you notice that certain medicines seem to be causing your vertigo. Some of the medicines used to treat vertigo episodes can actually make them worse in some people. SEEK IMMEDIATE MEDICAL CARE IF:   Your medicines do not relieve your vertigo or are making it worse.  You develop problems with talking, walking, weakness, or using your arms, hands, or legs.  You develop severe headaches.  Your nausea or vomiting continues or gets worse.  You develop visual changes.  A family member notices behavioral changes.  Your condition gets worse. MAKE SURE YOU:  Understand these instructions.  Will watch your condition.  Will get help right away if you are not doing well or get worse. Document Released: 02/08/2005 Document Revised: 07/24/2011 Document Reviewed: 11/17/2010 [facility] Patient Information 2015 [place], [place]. This information is not intended to replace advice given to you by your health care provider. Make sure you discuss any questions you have with your health care provider.      Standley Brooking. Panosh M.D.

## 2014-06-29 NOTE — Patient Instructions (Addendum)
Begin claritin 10 mg  Generic  every day  You blood pressure is in the low 152  And doesnt change much  When you stand  this acts like a vertigo   Type of dizziness also .   Doesn't seem like  From  blood sugar .  Send in ent referral to assess  Because of the hearing and vertigo feeling. At rthe same time     Vertigo Vertigo means you feel like you or your surroundings are moving when they are not. Vertigo can be dangerous if it occurs when you are at work, driving, or performing difficult activities.  CAUSES  Vertigo occurs when there is a conflict of signals sent to your brain from the visual and sensory systems in your body. There are many different causes of vertigo, including:  Infections, especially in the inner ear.  A bad reaction to a drug or misuse of alcohol and medicines.  Withdrawal from drugs or alcohol.  Rapidly changing positions, such as lying down or rolling over in bed.  A migraine headache.  Decreased blood flow to the brain.  Increased pressure in the brain from a head injury, infection, tumor, or bleeding. SYMPTOMS  You may feel as though the world is spinning around or you are falling to the ground. Because your balance is upset, vertigo can cause nausea and vomiting. You may have involuntary eye movements (nystagmus). DIAGNOSIS  Vertigo is usually diagnosed by physical exam. If the cause of your vertigo is unknown, your caregiver may perform imaging tests, such as an MRI scan (magnetic resonance imaging). TREATMENT  Most cases of vertigo resolve on their own, without treatment. Depending on the cause, your caregiver may prescribe certain medicines. If your vertigo is related to body position issues, your caregiver may recommend movements or procedures to correct the problem. In rare cases, if your vertigo is caused by certain inner ear problems, you may need surgery. HOME CARE INSTRUCTIONS   Follow your caregiver's instructions.  Avoid driving.  Avoid  operating heavy machinery.  Avoid performing any tasks that would be dangerous to you or others during a vertigo episode.  Tell your caregiver if you notice that certain medicines seem to be causing your vertigo. Some of the medicines used to treat vertigo episodes can actually make them worse in some people. SEEK IMMEDIATE MEDICAL CARE IF:   Your medicines do not relieve your vertigo or are making it worse.  You develop problems with talking, walking, weakness, or using your arms, hands, or legs.  You develop severe headaches.  Your nausea or vomiting continues or gets worse.  You develop visual changes.  A family member notices behavioral changes.  Your condition gets worse. MAKE SURE YOU:  Understand these instructions.  Will watch your condition.  Will get help right away if you are not doing well or get worse. Document Released: 02/08/2005 Document Revised: 07/24/2011 Document Reviewed: 11/17/2010 [facility] Patient Information 2015 [place], [place]. This information is not intended to replace advice given to you by your health care provider. Make sure you discuss any questions you have with your health care provider.

## 2014-07-10 ENCOUNTER — Encounter: Payer: Self-pay | Admitting: Dietician

## 2014-07-10 ENCOUNTER — Encounter: Payer: Medicare Other | Attending: Internal Medicine | Admitting: Dietician

## 2014-07-10 VITALS — Ht 61.0 in | Wt 185.0 lb

## 2014-07-10 DIAGNOSIS — E119 Type 2 diabetes mellitus without complications: Secondary | ICD-10-CM | POA: Insufficient documentation

## 2014-07-10 DIAGNOSIS — Z713 Dietary counseling and surveillance: Secondary | ICD-10-CM | POA: Diagnosis not present

## 2014-07-10 DIAGNOSIS — I10 Essential (primary) hypertension: Secondary | ICD-10-CM | POA: Insufficient documentation

## 2014-07-10 NOTE — Progress Notes (Signed)
  Medical Nutrition Therapy:  Appt start time: 1030 end time:  1130.  Assessment:  Primary concerns today: Patient is here with her daughter.  She is newly diagnosed with Diabetes with HgbA1C of 6.8%.  Patient wants to know how to eat better to control her blood sugar.  Labs noted with BUN 28, Creat 1.61.  Hx also includes HTN.  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  Patient lives with her granddaughter.  Patient does her own shopping and cooking.  Preferred Learning Style:   No preference indicated   Learning Readiness:   Ready  MEDICATIONS: see list   DIETARY INTAKE: Patient states that she does not use salt.  Used to skip breakfast. Skips lunch at times. Rare dessert  24-hr recall:  B (9-10AM):  1-2 eggs and 2 biscuits or 1-2 slices of toast, coffee with swt and low and powdered creamer, occasional OJ Snk ( AM):   L (2PM): Sometimes skips, stew or other chicken, vegetables and bread Snk ( PM): rare D ( PM): pintos, green beans, chicken, roll or biscuit Snk ( PM):  Beverages: coffee, regular coke, juice(occasionally), water  Usual physical activity: walks at the [employer] 3 times per week but not recently because of the weather.  Likes to exercise outside.  Has silver sneaker membership but does not go.  Estimated energy needs: 1200 calories 135 g carbohydrates 75 g protein 40 g fat  Progress Towards Goal(s):  In progress.   Nutritional Diagnosis:  NB-1.1 Food and nutrition-related knowledge deficit As related to balance of carbohydrate, protein, and fat.  As evidenced by diet hx..    Intervention:  Nutrition counseling and diabetes education initiated. Discussed Carb Counting by food group as method of portion control, reading food labels, and benefits of increased activity  Bake, broil, or grill rather than fry. Get the leanest meat and remove the skin from the chicken. Be thoughtful about portion size. Aim for 2-3  Carbohydrate choices per meal Eat more non starchy vegetables. Walk for 30 minutes every day as able.  Try the silver sneaker program at the [facility]. Remember to eat regularly.  Breakfast, lunch and dinner.  Teaching Method Utilized:  Visual Auditory  Handouts given during visit include:  My Plate placemat  Meal plan card  Snack list  Barriers to learning/adherence to lifestyle change: ability to understand  Demonstrated degree of understanding via:  Teach Back   Monitoring/Evaluation:  Dietary intake, exercise, and body weight in 1 month(s).

## 2014-07-10 NOTE — Patient Instructions (Signed)
Bake, broil, or grill rather than fry. Get the leanest meat and remove the skin from the chicken. Be thoughtful about portion size. Aim for 2-3 Carbohydrate choices per meal Eat more non starchy vegetables. Walk for 30 minutes every day as able.  Try the silver sneaker program at the [facility]. Remember to eat regularly.  Breakfast, lunch and dinner.

## 2014-07-12 ENCOUNTER — Emergency Department (HOSPITAL_COMMUNITY)
Admission: EM | Admit: 2014-07-12 | Discharge: 2014-07-12 | Disposition: A | Discharge status: Home / Self Care | Payer: Medicare Other | Source: Home / Self Care | Attending: Emergency Medicine | Admitting: Emergency Medicine

## 2014-07-12 ENCOUNTER — Encounter (HOSPITAL_COMMUNITY): Payer: Self-pay | Admitting: Emergency Medicine

## 2014-07-12 ENCOUNTER — Emergency Department (INDEPENDENT_AMBULATORY_CARE_PROVIDER_SITE_OTHER): Payer: Medicare Other

## 2014-07-12 DIAGNOSIS — R112 Nausea with vomiting, unspecified: Secondary | ICD-10-CM

## 2014-07-12 DIAGNOSIS — J069 Acute upper respiratory infection, unspecified: Secondary | ICD-10-CM

## 2014-07-12 DIAGNOSIS — R109 Unspecified abdominal pain: Secondary | ICD-10-CM | POA: Diagnosis not present

## 2014-07-12 LAB — POCT URINALYSIS DIP (DEVICE)
BILIRUBIN URINE: NEGATIVE
Glucose, UA: NEGATIVE mg/dL
Ketones, ur: NEGATIVE mg/dL
LEUKOCYTES UA: NEGATIVE
NITRITE: NEGATIVE
PH: 5 (ref 5.0–8.0)
PROTEIN: NEGATIVE mg/dL
Urobilinogen, UA: 0.2 mg/dL (ref 0.0–1.0)

## 2014-07-12 MED ORDER — ONDANSETRON HCL 4 MG PO TABS: 4.0000 mg | ORAL_TABLET | Freq: Three times a day (TID) | ORAL | Status: DC | PRN

## 2014-07-12 MED ORDER — ONDANSETRON 4 MG PO TBDP
ORAL_TABLET | ORAL | Status: AC
  Filled 2014-07-12: qty 1

## 2014-07-12 MED ORDER — ONDANSETRON 4 MG PO TBDP
4.0000 mg | ORAL_TABLET | Freq: Once | ORAL | Status: AC
  Administered 2014-07-12: 4 mg via ORAL

## 2014-07-12 MED ORDER — BENZONATATE 100 MG PO CAPS: 100.0000 mg | ORAL_CAPSULE | Freq: Three times a day (TID) | ORAL | Status: DC | PRN

## 2014-07-12 NOTE — ED Notes (Signed)
Patient transported to X-ray 

## 2014-07-12 NOTE — ED Provider Notes (Signed)
CSN: [id number]     Arrival date & time 07/12/14  1759 History   First MD Initiated Contact with Patient 07/12/14 1916     Chief Complaint  Patient presents with  . Emesis   (Consider location/radiation/quality/duration/timing/severity/associated sxs/prior Treatment) HPI Comments: Patient reports nausea, vomiting, nasal congestion and cough since 07/09/2014. No diarrhea. Normal BMs. No fever. No GU symptoms.  S/P remote partial hysterectomy. No abdominal pain.   Patient is a 81 y.o. female presenting with vomiting. The history is provided by the patient.  Emesis Severity:  Moderate Duration:  4 days Timing:  Intermittent Associated symptoms: no abdominal pain, no chills, no diarrhea, no headaches, no myalgias and no sore throat     Past Medical History  Diagnosis Date  . Asthma     " onset when young"   . History of DVT (deep vein thrombosis)     "RLE" (09/09/2012) coumadine cant take asa so on plavix   . Allergy   . GERD (gastroesophageal reflux disease)   . Hyperlipidemia   . Hypertension   . Varicose veins   . Lupus     "cured years ago" (09/09/2012)  . Headache(784.0)     "related to my high blood pressure" (09/09/2012)  . Arthritis     "shoulders" (09/09/2012)  . Anxiety   . Anemia    Past Surgical History  Procedure Laterality Date  . Abdominal hysterectomy      partial  . Breast surgery    . Carpal tunnel release Right 2000's  . Shoulder open rotator cuff repair Bilateral 2000's  . Carpal tunnel release Bilateral 10/21/2013    Procedure: BILATERAL  CARPAL TUNNEL RELEASE;  Surgeon: [name], MD;  Location: [place];  Service: Orthopedics;  Laterality: Bilateral;  . Trigger finger release Right 10/21/2013    Procedure: RELEASE TRIGGER FINGER/A-1 PULLEY RIGHT MIDDLE AND RIGHT RING;  Surgeon: [name], MD;  Location: [place];  Service: Orthopedics;  Laterality: Right;   Family History  Problem Relation Age of Onset  .  Hypertension Mother   . Hypertension Father   . Cancer Sister     Colon and Breast Cancer  . Hypertension Brother   . Heart attack Daughter   . Lung cancer      both parents    History  Substance Use Topics  . Smoking status: Never Smoker   . Smokeless tobacco: Never Used  . Alcohol Use: No   OB History    Gravida Para Term Preterm AB TAB SAB Ectopic Multiple Living   7 7              Obstetric Comments   Lost 2 children soon after birth     Review of Systems  Constitutional: Positive for appetite change. Negative for fever, chills and fatigue.  HENT: Positive for congestion. Negative for postnasal drip, rhinorrhea and sore throat.   Eyes: Negative.   Respiratory: Positive for cough and wheezing. Negative for chest tightness and shortness of breath.   Cardiovascular: Negative.  Negative for chest pain, palpitations and leg swelling.  Gastrointestinal: Positive for nausea and vomiting. Negative for abdominal pain, diarrhea, constipation and blood in stool.  Genitourinary: Negative for dysuria, urgency, frequency, hematuria, flank pain, vaginal bleeding, vaginal discharge, difficulty urinating, vaginal pain and pelvic pain.  Musculoskeletal: Negative for myalgias and back pain.  Skin: Negative.   Neurological: Negative for dizziness, syncope, weakness, light-headedness and headaches.    Allergies  Penicillins and Aspirin  Home Medications   Prior to Admission medications   Medication Sig Start Date End Date Taking? Authorizing Provider  acetaminophen (TYLENOL) 500 MG tablet Take 1,000 mg by mouth every 6 (six) hours as needed for moderate pain.    Historical Provider, MD  ADVAIR DISKUS 250-50 MCG/DOSE AEPB USE 1 PUFF TWICE DAILY.    Burnis Medin, MD  albuterol (PROVENTIL) (2.5 MG/3ML) 0.083% nebulizer solution USE 1 AMPULE EVERY 6 HOURS AS NEEDED FOR WHEEZE 05/20/14   Burnis Medin, MD  benzonatate (TESSALON) 100 MG capsule Take 1 capsule (100 mg total) by mouth 3 (three)  times daily as needed for cough. 07/12/14   Audelia Hives Presson, PA  clopidogrel (PLAVIX) 75 MG tablet TAKE 1 TABLET ONCE DAILY. Hold wile on lovenox 03/27/14   Burnis Medin, MD  diclofenac sodium (VOLTAREN) 1 % GEL Apply 4 g topically 4 (four) times daily. 06/15/14   Burnis Medin, MD  doxazosin (CARDURA) 2 MG tablet Take 3 tablets (6 mg total) by mouth at bedtime. Daily in the evening 04/08/14   Burnis Medin, MD  fenofibrate 160 MG tablet Take 1 tablet (160 mg total) by mouth daily. 05/14/14   Burnis Medin, MD  ferrous sulfate 325 (65 FE) MG EC tablet Take 1 tablet (325 mg total) by mouth daily with breakfast. 05/30/13   Concha Norway, MD  furosemide (LASIX) 20 MG tablet TAKE 1 TABLET EACH DAY.    Burnis Medin, MD  guaiFENesin (MUCINEX) 600 MG 12 hr tablet Take 600 mg by mouth 2 (two) times daily as needed for cough or to loosen phlegm.    Historical Provider, MD  LORazepam (ATIVAN) 1 MG tablet Take 1 tablet (1 mg total) by mouth every 8 (eight) hours as needed for anxiety. 06/22/14   Burnis Medin, MD  metoprolol succinate (TOPROL-XL) 50 MG 24 hr tablet TAKE 1 TABLET ONCE DAILY WITH FOOD. 04/17/14   Burnis Medin, MD  montelukast (SINGULAIR) 10 MG tablet TAKE 1 TABLET ONCE DAILY. 05/05/14   Burnis Medin, MD  ondansetron (ZOFRAN) 4 MG tablet Take 1 tablet (4 mg total) by mouth every 8 (eight) hours as needed for nausea or vomiting. 07/12/14   Audelia Hives Presson, PA  pantoprazole (PROTONIX) 40 MG tablet TAKE 1 TABLET ONCE DAILY. 05/05/14   Burnis Medin, MD  potassium chloride (K-DUR) 10 MEQ tablet Take 4 tablets (40 mEq total) by mouth daily. For one month 05/14/14   Burnis Medin, MD  PROAIR HFA 108 (90 BASE) MCG/ACT inhaler USE 2 PUFFS EVERY 6 HOURS AS NEEDED FOR WHEEZING. 04/17/14   Burnis Medin, MD  TRIBENZOR 40-10-25 MG TABS TAKE 1 TABLET ONCE DAILY. 05/05/14   Burnis Medin, MD  vitamin B-12 (CYANOCOBALAMIN) 1000 MCG tablet Take 1,000 mcg by mouth daily.    Historical Provider,  MD   BP 160/64 mmHg  Pulse 65  Temp(Src) 98.2 F (36.8 C) (Oral)  Resp 20  SpO2 97% Physical Exam  Constitutional: She is oriented to person, place, and time. She appears well-developed and well-nourished. No distress.  HENT:  Head: Normocephalic and atraumatic.  Mouth/Throat: Oropharynx is clear and moist.  Eyes: Conjunctivae are normal. No scleral icterus.  Neck: Normal range of motion. Neck supple.  Cardiovascular: Normal rate, regular rhythm and normal heart sounds.   Pulmonary/Chest: Effort normal and breath sounds normal. No respiratory distress. She has no wheezes.  Abdominal: Normal appearance. She exhibits no distension and no  mass. Bowel sounds are decreased. There is no tenderness. There is no rigidity, no rebound, no guarding and no CVA tenderness.  Musculoskeletal: Normal range of motion.  Lymphadenopathy:    She has no cervical adenopathy.  Neurological: She is alert and oriented to person, place, and time.  Skin: Skin is warm and dry.  Psychiatric: She has a normal mood and affect. Her behavior is normal.  Nursing note and vitals reviewed.   ED Course  Procedures (including critical care time) Labs Review Labs Reviewed  POCT URINALYSIS DIP (DEVICE) - Abnormal; Notable for the following:    Hgb urine dipstick TRACE (*)    All other components within normal limits  URINE CULTURE    Imaging Review Dg Abd Acute W/chest  07/12/2014   CLINICAL DATA:  Abdominal pain for 2 days with nausea and vomiting  EXAM: ACUTE ABDOMEN SERIES (ABDOMEN 2 VIEW & CHEST 1 VIEW)  COMPARISON:  04/13/2014  FINDINGS: Cardiac shadow remains enlarged. The lungs are clear bilaterally. Scattered large and small bowel gas is noted. No free intraperitoneal air is seen. No acute bony abnormality is noted. No abnormal mass or abnormal calcifications are seen. Degenerative changes of the lumbar spine are noted.  IMPRESSION: No acute abnormality is seen.   Electronically Signed   By: Inez Catalina M.D.    On: 07/12/2014 20:03     MDM   1. URI (upper respiratory infection)   2. Non-intractable vomiting with nausea, vomiting of unspecified type   Exam unremarkable. Vital signs normal with exception of mildly elevated blood pressure. UA unremarkable Chest and abdominal films also without acute abnormality.  ECG: NSR at 63 bpm. Normal intervals. No ectopy. Normal ST/waves. The films of your chest and abdomen were normal. Your urine studies were also normal. Please use tessalon as directed for cough and zofran as directed for nausea and vomiting. Please call your primary care doctor tomorrow to arrange a follow up appointment. If symptoms become suddenly worse or severe, please seek immediate re-evaluation at your nearest emergency room.    Lutricia Feil, [place] 07/12/14 2038

## 2014-07-12 NOTE — Discharge Instructions (Signed)
The films of your chest and abdomen were normal. Your urine studies were also normal. Please use tessalon as directed for cough and zofran as directed for nausea and vomiting. Please call your primary care doctor tomorrow to arrange a follow up appointment. If symptoms become suddenly worse or severe, please seek immediate re-evaluation at your nearest emergency room.  Nausea and Vomiting Nausea is a sick feeling that often comes before throwing up (vomiting). Vomiting is a reflex where stomach contents come out of your mouth. Vomiting can cause severe loss of body fluids (dehydration). Children and elderly adults can become dehydrated quickly, especially if they also have diarrhea. Nausea and vomiting are symptoms of a condition or disease. It is important to find the cause of your symptoms. CAUSES   Direct irritation of the stomach lining. This irritation can result from increased acid production (gastroesophageal reflux disease), infection, food poisoning, taking certain medicines (such as nonsteroidal anti-inflammatory drugs), alcohol use, or tobacco use.  Signals from the brain.These signals could be caused by a headache, heat exposure, an inner ear disturbance, increased pressure in the brain from injury, infection, a tumor, or a concussion, pain, emotional stimulus, or metabolic problems.  An obstruction in the gastrointestinal tract (bowel obstruction).  Illnesses such as diabetes, hepatitis, gallbladder problems, appendicitis, kidney problems, cancer, sepsis, atypical symptoms of a heart attack, or eating disorders.  Medical treatments such as chemotherapy and radiation.  Receiving medicine that makes you sleep (general anesthetic) during surgery. DIAGNOSIS Your caregiver may ask for tests to be done if the problems do not improve after a few days. Tests may also be done if symptoms are severe or if the reason for the nausea and vomiting is not clear. Tests may include:  Urine  tests.  Blood tests.  Stool tests.  Cultures (to look for evidence of infection).  X-rays or other imaging studies. Test results can help your caregiver make decisions about treatment or the need for additional tests. TREATMENT You need to stay well hydrated. Drink frequently but in small amounts.You may wish to drink water, sports drinks, clear broth, or eat frozen ice pops or gelatin dessert to help stay hydrated.When you eat, eating slowly may help prevent nausea.There are also some antinausea medicines that may help prevent nausea. HOME CARE INSTRUCTIONS   Take all medicine as directed by your caregiver.  If you do not have an appetite, do not force yourself to eat. However, you must continue to drink fluids.  If you have an appetite, eat a normal diet unless your caregiver tells you differently.  Eat a variety of complex carbohydrates (rice, wheat, potatoes, bread), lean meats, yogurt, fruits, and vegetables.  Avoid high-fat foods because they are more difficult to digest.  Drink enough water and fluids to keep your urine clear or pale yellow.  If you are dehydrated, ask your caregiver for specific rehydration instructions. Signs of dehydration may include:  Severe thirst.  Dry lips and mouth.  Dizziness.  Dark urine.  Decreasing urine frequency and amount.  Confusion.  Rapid breathing or pulse. SEEK IMMEDIATE MEDICAL CARE IF:   You have blood or brown flecks (like coffee grounds) in your vomit.  You have black or bloody stools.  You have a severe headache or stiff neck.  You are confused.  You have severe abdominal pain.  You have chest pain or trouble breathing.  You do not urinate at least once every 8 hours.  You develop cold or clammy skin.  You continue to vomit  for longer than 24 to 48 hours.  You have a fever. MAKE SURE YOU:   Understand these instructions.  Will watch your condition.  Will get help right away if you are not doing  well or get worse. Document Released: 05/01/2005 Document Revised: 07/24/2011 Document Reviewed: 09/28/2010 [facility] Patient Information 2015 [place], [place]. This information is not intended to replace advice given to you by your health care provider. Make sure you discuss any questions you have with your health care provider.  Upper Respiratory Infection, Adult An upper respiratory infection (URI) is also sometimes known as the common cold. The upper respiratory tract includes the nose, sinuses, throat, trachea, and bronchi. Bronchi are the airways leading to the lungs. Most people improve within 1 week, but symptoms can last up to 2 weeks. A residual cough may last even longer.  CAUSES Many different viruses can infect the tissues lining the upper respiratory tract. The tissues become irritated and inflamed and often become very moist. Mucus production is also common. A cold is contagious. You can easily spread the virus to others by oral contact. This includes kissing, sharing a glass, coughing, or sneezing. Touching your mouth or nose and then touching a surface, which is then touched by another person, can also spread the virus. SYMPTOMS  Symptoms typically develop 1 to 3 days after you come in contact with a cold virus. Symptoms vary from person to person. They may include:  Runny nose.  Sneezing.  Nasal congestion.  Sinus irritation.  Sore throat.  Loss of voice (laryngitis).  Cough.  Fatigue.  Muscle aches.  Loss of appetite.  Headache.  Low-grade fever. DIAGNOSIS  You might diagnose your own cold based on familiar symptoms, since most people get a cold 2 to 3 times a year. Your caregiver can confirm this based on your exam. Most importantly, your caregiver can check that your symptoms are not due to another disease such as strep throat, sinusitis, pneumonia, asthma, or epiglottitis. Blood tests, throat tests, and X-rays are not necessary to diagnose a common cold, but  they may sometimes be helpful in excluding other more serious diseases. Your caregiver will decide if any further tests are required. RISKS AND COMPLICATIONS  You may be at risk for a more severe case of the common cold if you smoke cigarettes, have chronic heart disease (such as heart failure) or lung disease (such as asthma), or if you have a weakened immune system. The very young and very old are also at risk for more serious infections. Bacterial sinusitis, middle ear infections, and bacterial pneumonia can complicate the common cold. The common cold can worsen asthma and chronic obstructive pulmonary disease (COPD). Sometimes, these complications can require emergency medical care and may be life-threatening. PREVENTION  The best way to protect against getting a cold is to practice good hygiene. Avoid oral or hand contact with people with cold symptoms. Wash your hands often if contact occurs. There is no clear evidence that vitamin C, vitamin E, echinacea, or exercise reduces the chance of developing a cold. However, it is always recommended to get plenty of rest and practice good nutrition. TREATMENT  Treatment is directed at relieving symptoms. There is no cure. Antibiotics are not effective, because the infection is caused by a virus, not by bacteria. Treatment may include:  Increased fluid intake. Sports drinks offer valuable electrolytes, sugars, and fluids.  Breathing heated mist or steam (vaporizer or shower).  Eating chicken soup or other clear broths, and maintaining good  nutrition.  Getting plenty of rest.  Using gargles or lozenges for comfort.  Controlling fevers with ibuprofen or acetaminophen as directed by your caregiver.  Increasing usage of your inhaler if you have asthma. Zinc gel and zinc lozenges, taken in the first 24 hours of the common cold, can shorten the duration and lessen the severity of symptoms. Pain medicines may help with fever, muscle aches, and throat  pain. A variety of non-prescription medicines are available to treat congestion and runny nose. Your caregiver can make recommendations and may suggest nasal or lung inhalers for other symptoms.  HOME CARE INSTRUCTIONS   Only take over-the-counter or prescription medicines for pain, discomfort, or fever as directed by your caregiver.  Use a warm mist humidifier or inhale steam from a shower to increase air moisture. This may keep secretions moist and make it easier to breathe.  Drink enough water and fluids to keep your urine clear or pale yellow.  Rest as needed.  Return to work when your temperature has returned to normal or as your caregiver advises. You may need to stay home longer to avoid infecting others. You can also use a face mask and careful hand washing to prevent spread of the virus. SEEK MEDICAL CARE IF:   After the first few days, you feel you are getting worse rather than better.  You need your caregiver's advice about medicines to control symptoms.  You develop chills, worsening shortness of breath, or brown or red sputum. These may be signs of pneumonia.  You develop yellow or brown nasal discharge or pain in the face, especially when you bend forward. These may be signs of sinusitis.  You develop a fever, swollen neck glands, pain with swallowing, or white areas in the back of your throat. These may be signs of strep throat. SEEK IMMEDIATE MEDICAL CARE IF:   You have a fever.  You develop severe or persistent headache, ear pain, sinus pain, or chest pain.  You develop wheezing, a prolonged cough, cough up blood, or have a change in your usual mucus (if you have chronic lung disease).  You develop sore muscles or a stiff neck. Document Released: 10/25/2000 Document Revised: 07/24/2011 Document Reviewed: 08/06/2013 [facility] Patient Information 2015 [place], [place]. This information is not intended to replace advice given to you by your health care provider. Make sure  you discuss any questions you have with your health care provider.

## 2014-07-12 NOTE — ED Notes (Signed)
Vomiting started Friday evening.  No vomiting during the day yesterday, but last night vomiting started again.  Today has chest congestion and has been vomiting.  No diarrhea.

## 2014-07-13 ENCOUNTER — Ambulatory Visit (INDEPENDENT_AMBULATORY_CARE_PROVIDER_SITE_OTHER): Payer: Medicare Other | Admitting: Internal Medicine

## 2014-07-13 ENCOUNTER — Ambulatory Visit (HOSPITAL_COMMUNITY): Payer: Medicare Other | Attending: Cardiovascular Disease | Admitting: Cardiology

## 2014-07-13 ENCOUNTER — Encounter: Payer: Self-pay | Admitting: Internal Medicine

## 2014-07-13 ENCOUNTER — Other Ambulatory Visit (HOSPITAL_COMMUNITY): Payer: Self-pay | Admitting: Cardiology

## 2014-07-13 ENCOUNTER — Other Ambulatory Visit: Payer: Self-pay | Admitting: Internal Medicine

## 2014-07-13 VITALS — BP 152/50 | HR 66 | Temp 98.2°F | Ht 61.0 in | Wt 184.1 lb

## 2014-07-13 DIAGNOSIS — F411 Generalized anxiety disorder: Secondary | ICD-10-CM

## 2014-07-13 DIAGNOSIS — Z8672 Personal history of thrombophlebitis: Secondary | ICD-10-CM

## 2014-07-13 DIAGNOSIS — J988 Other specified respiratory disorders: Secondary | ICD-10-CM | POA: Diagnosis not present

## 2014-07-13 DIAGNOSIS — I809 Phlebitis and thrombophlebitis of unspecified site: Secondary | ICD-10-CM | POA: Insufficient documentation

## 2014-07-13 DIAGNOSIS — M79605 Pain in left leg: Secondary | ICD-10-CM

## 2014-07-13 DIAGNOSIS — J22 Unspecified acute lower respiratory infection: Secondary | ICD-10-CM | POA: Insufficient documentation

## 2014-07-13 MED ORDER — LORAZEPAM 1 MG PO TABS: 1.0000 mg | ORAL_TABLET | Freq: Three times a day (TID) | ORAL | Status: DC | PRN

## 2014-07-13 NOTE — Progress Notes (Signed)
Pre visit review using our clinic review tool, if applicable. No additional management support is needed unless otherwise documented below in the visit note.  Chief Complaint  Patient presents with  . Leg Pain    Left leg behind the knee and extends down to her foot.    HPI: Patient [name]  comes in today for SDA for  new problem evaluation. Here with oldest daughter today .   See last visit a few weeks agof ro dizziness    Seen dr Thornell Mule and not felt top be vertigo or ear apparatus related She was seen in ed last pm     For  Uri and abd pain vomiting  Neg eval and felt to be viral and to do sx rx and observe  Had plan x ray  No fever ? waht to do for cough didn't take the tessalon perles currently  Now has increasing  left leg pain  Over the past weeks . Comes in today  Describes stabbing pain and points to upper calf and down to foot   No fever falling   Asks for refill ativan as needed for anxiety  Not taking every day   ROS: See pertinent positives and negatives per HPI. No fver cough no change in sob  No syncope   Past Medical History  Diagnosis Date  . Asthma     " onset when young"   . History of DVT (deep vein thrombosis)     "RLE" (09/09/2012) coumadine cant take asa so on plavix   . Allergy   . GERD (gastroesophageal reflux disease)   . Hyperlipidemia   . Hypertension   . Varicose veins   . Lupus     "cured years ago" (09/09/2012)  . Headache(784.0)     "related to my high blood pressure" (09/09/2012)  . Arthritis     "shoulders" (09/09/2012)  . Anxiety   . Anemia     Family History  Problem Relation Age of Onset  . Hypertension Mother   . Hypertension Father   . Cancer Sister     Colon and Breast Cancer  . Hypertension Brother   . Heart attack Daughter   . Lung cancer      both parents     History   Social History  . Marital Status: Single    Spouse Name: N/A  . Number of Children: N/A  . Years of Education: N/A   Social History Main  Topics  . Smoking status: Never Smoker   . Smokeless tobacco: Never Used  . Alcohol Use: No  . Drug Use: No  . Sexual Activity: No   Other Topics Concern  . None   Social History Narrative   2 people living in the home.  Grand daughter.   Up and down through the night      Had 7 children  2 deceased . 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    works 3 days per week currently .   Glasses dentures  Neg tad     Outpatient Encounter Prescriptions as of 07/13/2014  Medication Sig  . acetaminophen (TYLENOL) 500 MG tablet Take 1,000 mg by mouth every 6 (six) hours as needed for moderate pain.  [name] ADVAIR DISKUS 250-50 MCG/DOSE AEPB USE 1 PUFF TWICE DAILY.  [name] albuterol (PROVENTIL) (2.5 MG/3ML) 0.083% nebulizer solution USE 1 AMPULE EVERY 6 HOURS  AS NEEDED FOR WHEEZE  . clopidogrel (PLAVIX) 75 MG tablet TAKE 1 TABLET ONCE DAILY. Hold wile on lovenox  . diclofenac sodium (VOLTAREN) 1 % GEL Apply 4 g topically 4 (four) times daily.  [name] doxazosin (CARDURA) 2 MG tablet Take 3 tablets (6 mg total) by mouth at bedtime. Daily in the evening  . fenofibrate 160 MG tablet Take 1 tablet (160 mg total) by mouth daily.  . ferrous sulfate 325 (65 FE) MG EC tablet Take 1 tablet (325 mg total) by mouth daily with breakfast.  . furosemide (LASIX) 20 MG tablet TAKE 1 TABLET EACH DAY.  [name] guaiFENesin (MUCINEX) 600 MG 12 hr tablet Take 600 mg by mouth 2 (two) times daily as needed for cough or to loosen phlegm.  [name] LORazepam (ATIVAN) 1 MG tablet Take 1 tablet (1 mg total) by mouth every 8 (eight) hours as needed for anxiety.  . metoprolol succinate (TOPROL-XL) 50 MG 24 hr tablet TAKE 1 TABLET ONCE DAILY WITH FOOD.  [name] montelukast (SINGULAIR) 10 MG tablet TAKE 1 TABLET ONCE DAILY.  [name] ondansetron (ZOFRAN) 4 MG tablet Take 1 tablet (4 mg total) by mouth every 8 (eight) hours as needed for nausea or vomiting.  . pantoprazole (PROTONIX) 40 MG  tablet TAKE 1 TABLET ONCE DAILY.  [name] potassium chloride (K-DUR) 10 MEQ tablet Take 4 tablets (40 mEq total) by mouth daily. For one month  . TRIBENZOR 40-10-25 MG TABS TAKE 1 TABLET ONCE DAILY.  . vitamin B-12 (CYANOCOBALAMIN) 1000 MCG tablet Take 1,000 mcg by mouth daily.  . [DISCONTINUED] LORazepam (ATIVAN) 1 MG tablet Take 1 tablet (1 mg total) by mouth every 8 (eight) hours as needed for anxiety.  . [DISCONTINUED] PROAIR HFA 108 (90 BASE) MCG/ACT inhaler USE 2 PUFFS EVERY 6 HOURS AS NEEDED FOR WHEEZING.  . benzonatate (TESSALON) 100 MG capsule Take 1 capsule (100 mg total) by mouth 3 (three) times daily as needed for cough. (Patient not taking: Reported on 07/13/2014)    EXAM:  BP 152/50 mmHg  Pulse 66  Temp(Src) 98.2 F (36.8 C) (Oral)  Ht 5\' 1"  (1.549 m)  Wt 184 lb 1.6 oz (83.507 kg)  BMI 34.80 kg/m2  SpO2 98%  Body mass index is 34.8 kg/(m^2).  GENERAL: vitals reviewed and listed above, alert, oriented, appears well hydrated and in no acute distress mild congestion ocass cough non toxic  Some right eye watering  Unlabored respirations  HEENT: atraumatic, conjunctiva  clear, no obvious abnormalities on inspection of external nose and ears OP : no lesion edema or exudate  NECK: no obvious masses on inspection palpation  LUNGS: clear to auscultation bilaterally, no wheezes, rales or rhonchi,  Mild bronchila cough CV: HRRR, no clubbing cyanosis or   Legs 1+ edema r 2+ left  No redness  Sock removed some palpable vv in calf non tender  No knee effusion noted Nl  cap refill  MS: moves all extremities  Independent gait   pleasant and cooperative, a bit anxious   ASSESSMENT AND PLAN:  Discussed the following assessment and plan:  Leg pain, left - Plan: CANCELED: Lower Extremity Venous Duplex Left  Hx of phlebitis - Plan: CANCELED: Lower Extremity Venous Duplex Left  Acute respiratory infection - with cough and gi seen in ed [facility] last pm acute x ray neg acute changes   Anxiety state  - ativan consider adding low dose zoloft if continued need  caution withbenzos   -Patient advised to return or notify health care team  if symptoms worsen ,persist or new concerns arise.  Patient Instructions  Tests on left leg  For venous blood clots . Pain could be from  a number or causes and may need to get specialist to  see you again . It appears you have a chest and  Head cold .  Continue liquids as tolerated.   Do not take the lorazepam   a regular basis    If  Needing cause of anxiety we may  Try again a medication such as sertraline that can help with chronic anxiety in low doses  .  [name] in  About  weeks depending on how  You are doing        Standley Brooking. Frankee Gritz M.D.

## 2014-07-13 NOTE — Progress Notes (Signed)
Left lower venous duplex performed   Results given to Dr.Panosh.

## 2014-07-13 NOTE — Telephone Encounter (Signed)
Sent to the pharmacy by e-scribe. 

## 2014-07-13 NOTE — Patient Instructions (Signed)
Tests on left leg  For venous blood clots . Pain could be from  a number or causes and may need to get specialist to  see you again . It appears you have a chest and  Head cold .  Continue liquids as tolerated.   Do not take the lorazepam   a regular basis    If  Needing cause of anxiety we may  Try again a medication such as sertraline that can help with chronic anxiety in low doses  .  [name] in  About  weeks depending on how  You are doing

## 2014-07-14 ENCOUNTER — Telehealth: Payer: Self-pay | Admitting: Family Medicine

## 2014-07-14 ENCOUNTER — Other Ambulatory Visit: Payer: Self-pay | Admitting: Internal Medicine

## 2014-07-14 DIAGNOSIS — M25561 Pain in right knee: Secondary | ICD-10-CM

## 2014-07-14 DIAGNOSIS — M79605 Pain in left leg: Secondary | ICD-10-CM

## 2014-07-14 DIAGNOSIS — M79604 Pain in right leg: Secondary | ICD-10-CM

## 2014-07-14 DIAGNOSIS — M25562 Pain in left knee: Secondary | ICD-10-CM

## 2014-07-14 LAB — URINE CULTURE
Colony Count: 75000
Special Requests: NORMAL

## 2014-07-14 NOTE — Telephone Encounter (Signed)
Preliminary report is negative for clotting .  As usual I dont see a final report . In the med record  Lets get her appt  With ortho about leg pain And knee pain ( im not sure who she has seen before)  Is this arthritis or Other cause of pain ?  WP

## 2014-07-15 ENCOUNTER — Telehealth (HOSPITAL_COMMUNITY): Payer: Self-pay | Admitting: Cardiology

## 2014-07-15 NOTE — Telephone Encounter (Signed)
LMOM for the pt to return my call.

## 2014-07-15 NOTE — Telephone Encounter (Signed)
Preliminary results of venous duplex negative for DVT---final interpretation to follow.

## 2014-07-16 NOTE — Telephone Encounter (Signed)
Pt notified of preliminary results.  Order for referral placed in the system.

## 2014-07-17 NOTE — Telephone Encounter (Signed)
Sent to the pharmacy by e-scribe. 

## 2014-07-17 NOTE — Telephone Encounter (Signed)
Ok to refill x 6 months 

## 2014-07-20 DIAGNOSIS — M1712 Unilateral primary osteoarthritis, left knee: Secondary | ICD-10-CM | POA: Diagnosis not present

## 2014-07-20 DIAGNOSIS — M4726 Other spondylosis with radiculopathy, lumbar region: Secondary | ICD-10-CM | POA: Diagnosis not present

## 2014-07-27 DIAGNOSIS — M1712 Unilateral primary osteoarthritis, left knee: Secondary | ICD-10-CM | POA: Diagnosis not present

## 2014-07-31 ENCOUNTER — Other Ambulatory Visit: Payer: Self-pay

## 2014-07-31 DIAGNOSIS — Z1231 Encounter for screening mammogram for malignant neoplasm of breast: Secondary | ICD-10-CM

## 2014-08-04 ENCOUNTER — Encounter: Payer: Self-pay | Admitting: Internal Medicine

## 2014-08-04 ENCOUNTER — Ambulatory Visit (INDEPENDENT_AMBULATORY_CARE_PROVIDER_SITE_OTHER): Payer: Medicare Other | Admitting: Internal Medicine

## 2014-08-04 VITALS — BP 134/70 | Temp 98.2°F | Ht 61.0 in | Wt 177.1 lb

## 2014-08-04 DIAGNOSIS — Z79899 Other long term (current) drug therapy: Secondary | ICD-10-CM | POA: Diagnosis not present

## 2014-08-04 DIAGNOSIS — N289 Disorder of kidney and ureter, unspecified: Secondary | ICD-10-CM | POA: Diagnosis not present

## 2014-08-04 DIAGNOSIS — R7309 Other abnormal glucose: Secondary | ICD-10-CM | POA: Diagnosis not present

## 2014-08-04 DIAGNOSIS — I10 Essential (primary) hypertension: Secondary | ICD-10-CM | POA: Diagnosis not present

## 2014-08-04 DIAGNOSIS — M545 Low back pain: Secondary | ICD-10-CM | POA: Diagnosis not present

## 2014-08-04 DIAGNOSIS — R7303 Prediabetes: Secondary | ICD-10-CM

## 2014-08-04 LAB — GLUCOSE, POCT (MANUAL RESULT ENTRY): POC GLUCOSE: 137 mg/dL — AB (ref 70–99)

## 2014-08-04 NOTE — Progress Notes (Signed)
Pre visit review using our clinic review tool, if applicable. No additional management support is needed unless otherwise documented below in the visit note.  Chief Complaint  Patient presents with  . Follow-up    HPI: [name]  comes in today for follow up of  multiple medical problems.  Has seen ortho since last visit and felt ot have leg pain from back spinal disease pred helpd some but help leg hurts to the bone . ? Supposed to go back maybe  Dx lumbar radicular  With stenosis  Breathing better taking inhalers. Working with nutritionist to help bg to go back  Some weight lsoo had 2 eggs this  Am would like sugar checked ,.  BP was lower at home recently.  Using ativan as needed  Concern that a zoloft could cause se also ROS: See pertinent positives and negatives per HPI. No cough some alelrgy   Past Medical History  Diagnosis Date  . Asthma     " onset when young"   . History of DVT (deep vein thrombosis)     "RLE" (09/09/2012) coumadine cant take asa so on plavix   . Allergy   . GERD (gastroesophageal reflux disease)   . Hyperlipidemia   . Hypertension   . Varicose veins   . Lupus     "cured years ago" (09/09/2012)  . Headache(784.0)     "related to my high blood pressure" (09/09/2012)  . Arthritis     "shoulders" (09/09/2012)  . Anxiety   . Anemia     Family History  Problem Relation Age of Onset  . Hypertension Mother   . Hypertension Father   . Cancer Sister     Colon and Breast Cancer  . Hypertension Brother   . Heart attack Daughter   . Lung cancer      both parents     History   Social History  . Marital Status: Single    Spouse Name: N/A  . Number of Children: N/A  . Years of Education: N/A   Social History Main Topics  . Smoking status: Never Smoker   . Smokeless tobacco: Never Used  . Alcohol Use: No  . Drug Use: No  . Sexual Activity: No   Other Topics Concern  . None   Social History Narrative   2 people living in the home.   Grand daughter.   Up and down through the night      Had 7 children  2 deceased . 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    works 3 days per week currently .   Glasses dentures  Neg tad     Outpatient Encounter Prescriptions as of 08/04/2014  Medication Sig  . acetaminophen (TYLENOL) 500 MG tablet Take 1,000 mg by mouth every 6 (six) hours as needed for moderate pain.  [name] ADVAIR DISKUS 250-50 MCG/DOSE AEPB USE 1 PUFF TWICE DAILY.  [name] albuterol (PROVENTIL) (2.5 MG/3ML) 0.083% nebulizer solution USE 1 AMPULE EVERY 6 HOURS AS NEEDED FOR WHEEZE  . benzonatate (TESSALON) 100 MG capsule Take 1 capsule (100 mg total) by mouth 3 (three) times daily as needed for cough.  . clopidogrel (PLAVIX) 75 MG tablet TAKE 1 TABLET ONCE DAILY. Hold wile on lovenox  . diclofenac sodium (VOLTAREN) 1 % GEL Apply 4 g topically 4 (four) times daily.  [name] doxazosin (CARDURA) 2 MG tablet TAKE 3  TABLETS AT BEDTIME.  . fenofibrate 160 MG tablet Take 1 tablet (160 mg total) by mouth daily.  . ferrous sulfate 325 (65 FE) MG EC tablet Take 1 tablet (325 mg total) by mouth daily with breakfast.  . furosemide (LASIX) 20 MG tablet TAKE 1 TABLET EACH DAY.  [name] guaiFENesin (MUCINEX) 600 MG 12 hr tablet Take 600 mg by mouth 2 (two) times daily as needed for cough or to loosen phlegm.  [name] LORazepam (ATIVAN) 1 MG tablet Take 1 tablet (1 mg total) by mouth every 8 (eight) hours as needed for anxiety.  . metoprolol succinate (TOPROL-XL) 50 MG 24 hr tablet TAKE 1 TABLET ONCE DAILY WITH FOOD.  [name] montelukast (SINGULAIR) 10 MG tablet TAKE 1 TABLET ONCE DAILY.  [name] ondansetron (ZOFRAN) 4 MG tablet Take 1 tablet (4 mg total) by mouth every 8 (eight) hours as needed for nausea or vomiting.  . pantoprazole (PROTONIX) 40 MG tablet TAKE 1 TABLET ONCE DAILY.  [name] potassium chloride (K-DUR) 10 MEQ tablet Take 4 tablets (40 mEq total) by mouth daily. For one month  . PROAIR HFA  108 (90 BASE) MCG/ACT inhaler USE 2 PUFFS EVERY 6 HOURS AS NEEDED FOR WHEEZING.  Jabier Gauss 40-10-25 MG TABS TAKE 1 TABLET ONCE DAILY.  . vitamin B-12 (CYANOCOBALAMIN) 1000 MCG tablet Take 1,000 mcg by mouth daily.    EXAM:  BP 134/70 mmHg  Temp(Src) 98.2 F (36.8 C) (Oral)  Ht 5\' 1"  (1.549 m)  Wt 177 lb 1.6 oz (80.332 kg)  BMI 33.48 kg/m2  Body mass index is 33.48 kg/(m^2).  GENERAL: vitals reviewed and listed above, alert, oriented, appears well hydrated and in no acute distress HEENT: atraumatic, conjunctiva  clear, no obvious abnormalities on inspection of external nose and ears NECK: no obvious masses on inspection palpation  LUNGS: clear to auscultation bilaterally, no wheezes, rales or rhonchi, good air movement CV: HRRR, no clubbing cyanosis or  Legs edema left more than right  Compression knee socks  nl cap refill  MS: moves all extremities without noticeable PSYCH: pleasant and cooperative,  More relaxed today  Lab Results  Component Value Date   WBC 4.5 05/25/2014   HGB 10.5* 05/25/2014   HCT 33.4* 05/25/2014   PLT 216 05/25/2014   GLUCOSE 146* 06/11/2014   CHOL 171 09/09/2012   TRIG 148 09/09/2012   HDL 47 09/09/2012   LDLCALC 94 09/09/2012   ALT 13 02/16/2014   AST 18 02/16/2014   NA 132* 06/11/2014   K 4.1 06/11/2014   CL 96 06/11/2014   CREATININE 1.61* 06/11/2014   BUN 28* 06/11/2014   CO2 28 06/11/2014   TSH 1.99 04/27/2014   INR 0.94 12/22/2009   HGBA1C 6.8* 06/11/2014   BP Readings from Last 3 Encounters:  08/04/14 134/70  07/13/14 152/50  07/12/14 160/64   Wt Readings from Last 3 Encounters:  08/04/14 177 lb 1.6 oz (80.332 kg)  07/13/14 184 lb 1.6 oz (83.507 kg)  07/10/14 185 lb (83.915 kg)    134/80 ASSESSMENT AND PLAN:  Discussed the following assessment and plan:  Left low back pain, with sciatica presence unspecified - radiculopathy with stenosis see dr Lynann Bologna note. advise they manage meds not a candidate for nsaids  Pre-diabetes  vs early DM  - losing weight and better bg follow up labs as planned  - Plan: POC Glucose (CBG)  Essential hypertension - best on repeat  readings  poss pain better   Medication management  Renal insufficiency creatinine 1.3 -  last 1.7   Sob   Doing better  With inhalers.  Anxiety  Seems some better not interested in  Ssri at this time  Cautioned that should not take regular pt daughter aware  Total visit 42mins > 50% spent counseling and coordinating care     -Patient advised to return or notify health care team  if symptoms worsen ,persist or new concerns arise.  Patient Instructions   BP is much better   Keep up with the healthy diet . Follow up with dr Baker Janus office about the back  the back and leg pain seems to be mostly from your back pinched nerve and spine arthritis . I would like them to help with the pain control .   Wt Readings from Last 3 Encounters:  08/04/14 177 lb 1.6 oz (80.332 kg)  07/13/14 184 lb 1.6 oz (83.507 kg)  07/10/14 185 lb (83.915 kg)    Plan recheck labs and Ov in about 3 months   Lab pre visit  hga1c bmp lipid  Please  Avoid taking lorazepam every day  This can cause risk  Of  Falling and mental fogginess    Standley Brooking. Jahan Friedlander M.D.

## 2014-08-04 NOTE — Patient Instructions (Addendum)
BP is much better   Keep up with the healthy diet . Follow up with dr Baker Janus office about the back  the back and leg pain seems to be mostly from your back pinched nerve and spine arthritis . I would like them to help with the pain control .   Wt Readings from Last 3 Encounters:  08/04/14 177 lb 1.6 oz (80.332 kg)  07/13/14 184 lb 1.6 oz (83.507 kg)  07/10/14 185 lb (83.915 kg)    Plan recheck labs and Ov in about 3 months   Lab pre visit  hga1c bmp lipid  Please  Avoid taking lorazepam every day  This can cause risk  Of  Falling and mental fogginess

## 2014-08-07 ENCOUNTER — Ambulatory Visit
Admission: RE | Admit: 2014-08-07 | Discharge: 2014-08-07 | Disposition: A | Discharge status: Home / Self Care | Payer: Medicare Other | Source: Ambulatory Visit

## 2014-08-07 DIAGNOSIS — Z1231 Encounter for screening mammogram for malignant neoplasm of breast: Secondary | ICD-10-CM

## 2014-08-14 ENCOUNTER — Other Ambulatory Visit: Payer: Self-pay | Admitting: Internal Medicine

## 2014-08-14 ENCOUNTER — Encounter: Payer: Medicare Other | Attending: Internal Medicine | Admitting: Dietician

## 2014-08-14 VITALS — Wt 176.0 lb

## 2014-08-14 DIAGNOSIS — Z713 Dietary counseling and surveillance: Secondary | ICD-10-CM | POA: Insufficient documentation

## 2014-08-14 DIAGNOSIS — I10 Essential (primary) hypertension: Secondary | ICD-10-CM | POA: Diagnosis not present

## 2014-08-14 DIAGNOSIS — E119 Type 2 diabetes mellitus without complications: Secondary | ICD-10-CM | POA: Insufficient documentation

## 2014-08-14 DIAGNOSIS — R7303 Prediabetes: Secondary | ICD-10-CM

## 2014-08-14 NOTE — Telephone Encounter (Signed)
Called to the pharmacy and left on machine. 

## 2014-08-14 NOTE — Telephone Encounter (Signed)
Ok to refill x 1  

## 2014-08-14 NOTE — Patient Instructions (Signed)
Keep up the good work with the changes that you have made! Bake, broil, or grill rather than fry. Get the leanest meat and remove the skin from the chicken. Be thoughtful about portion size. Aim for 2-3 Carbohydrate choices per meal Eat more non starchy vegetables. Walk for 30 minutes every day as able.  Try the silver sneaker program at the [facility]. Remember to eat regularly.  Breakfast, lunch and dinner.  Breakfast ideas: 1/2 cup oatmeal, splenda, 1 slice toast with 1 tsp butter, boiled egg or peanut butter, 1 orange 1 slice of toast with 1 tsp butter, 1 egg made with onions and tomatoes, 1 piece of fruit  Lunch ideas:   1 cup Low sodium vegetable soup, 6 crackers, 1/2 [place] sandwich Banana sandwich (1/2 banana 1 T peanut butter, 2 slices wheat bread) [place] sandwich with tomato on wheat bread with 1 tablespoon lite mayo, fruit 1/2 cup cottage cheese, 1 cup melon, 6 crackers  Dinner ideas: Skinless chicken or lean meat the size of a deck of card, 1 baked sweet potato with 1 tsp margarine, green beans. Pinto beans, brown rice, greens, fresh fruit

## 2014-08-14 NOTE — Progress Notes (Signed)
Medical Nutrition Therapy:  Appt start time: 11:00 end time:  1130.  Assessment:  07/10/14 Primary concerns today: Patient is here with her daughter.  She is newly diagnosed with Diabetes with HgbA1C of 6.8%.  Patient wants to know how to eat better to control her blood sugar.  Labs noted with BUN 28, Creat 1.61.  Hx also includes HTN.  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  Patient lives with her granddaughter.  Patient does her own shopping and cooking.  08/14/14: Patient is here with her daughter.  She has lost from 185 lbs to 176 lbs in one month.  She complains that she is hungry.  Patient has reduced her sugar soda and juice intake, biscuits less often, drinking more water and having more regular meals.  She is walking more frequently until her back and legs started to hurt.  Patient verbalized need to call about participating in the silver sneaker program.  MEDICATIONS: see list   DIETARY INTAKE: Patient states that she does not use salt.  Used to skip breakfast. Skips lunch at times. Rare dessert  24-hr recall:  B (9-10AM):  Oatmeal, deli [place] or egg 1/2 slice of [employer] toast or occasional 1/2 biscuit, 1/3 cup OJ Snk ( AM):   L (2PM): cornflakes with milk and applesauce or [place] sandwich Snk ( PM): rare D ( PM): pintos, green beans, skinless chicken, 1/2 biscuit or 1/2 slice of [employer] bread Snk ( PM):  Beverages: water, rare regular soda 4-10 oz, less juice,  Usual physical activity: walks at the [employer] 3 times per week but not recently because of the weather.  Likes to exercise outside.  Has silver sneaker membership but does not go.  Estimated energy needs: 1200 calories 135 g carbohydrates 75 g protein 40 g fat  Progress Towards Goal(s):  In progress.   Nutritional Diagnosis:  NB-1.1 Food and nutrition-related knowledge deficit As related to balance of carbohydrate, protein, and fat.  As evidenced by diet hx..     Intervention:  Nutrition counseling and further diabetes education.    Keep up the good work with the changes that you have made! Bake, broil, or grill rather than fry. Get the leanest meat and remove the skin from the chicken. Be thoughtful about portion size. Aim for 2-3 Carbohydrate choices per meal Eat more non starchy vegetables. Walk for 30 minutes every day as able.  Try the silver sneaker program at the [facility]. Remember to eat regularly.  Breakfast, lunch and dinner.  Breakfast ideas: 1/2 cup oatmeal, splenda, 1 slice toast with 1 tsp butter, boiled egg or peanut butter, 1 orange 1 slice of toast with 1 tsp butter, 1 egg made with onions and tomatoes, 1 piece of fruit  Lunch ideas:   1 cup Low sodium vegetable soup, 6 crackers, 1/2 [place] sandwich Banana sandwich (1/2 banana 1 T peanut butter, 2 slices wheat bread) [place] sandwich with tomato on wheat bread with 1 tablespoon lite mayo, fruit 1/2 cup cottage cheese, 1 cup melon, 6 crackers  Dinner ideas: Skinless chicken or lean meat the size of a deck of card, 1 baked sweet potato with 1 tsp margarine, green beans. Pinto beans, brown rice, greens, fresh fruit  Teaching Method Utilized:  Visual Auditory  Handouts given during visit include:  My Plate placemat  Meal plan card  Snack list  Barriers to learning/adherence to lifestyle change: ability to understand  Demonstrated degree of understanding  via:  Teach Back   Monitoring/Evaluation:  Dietary intake, exercise, and body weight in 6 weeks.

## 2014-08-21 DIAGNOSIS — H04203 Unspecified epiphora, bilateral lacrimal glands: Secondary | ICD-10-CM | POA: Diagnosis not present

## 2014-08-21 DIAGNOSIS — H40023 Open angle with borderline findings, high risk, bilateral: Secondary | ICD-10-CM | POA: Diagnosis not present

## 2014-08-25 ENCOUNTER — Other Ambulatory Visit: Payer: Self-pay | Admitting: Internal Medicine

## 2014-08-25 NOTE — Telephone Encounter (Signed)
Denied.  Filled by Dr. Boyce Medici office

## 2014-08-28 ENCOUNTER — Other Ambulatory Visit: Payer: Self-pay | Admitting: Internal Medicine

## 2014-08-28 ENCOUNTER — Other Ambulatory Visit: Payer: Self-pay | Admitting: *Deleted

## 2014-08-28 NOTE — Telephone Encounter (Signed)
Sent to the pharmacy by e-scribe. 

## 2014-08-28 NOTE — Telephone Encounter (Signed)
Opened in error

## 2014-09-04 ENCOUNTER — Ambulatory Visit (INDEPENDENT_AMBULATORY_CARE_PROVIDER_SITE_OTHER): Payer: Medicare Other | Admitting: Internal Medicine

## 2014-09-04 ENCOUNTER — Encounter: Payer: Self-pay | Admitting: Internal Medicine

## 2014-09-04 VITALS — BP 154/60 | HR 68 | Temp 98.4°F | Ht 61.0 in | Wt 175.0 lb

## 2014-09-04 DIAGNOSIS — J988 Other specified respiratory disorders: Secondary | ICD-10-CM

## 2014-09-04 DIAGNOSIS — J45901 Unspecified asthma with (acute) exacerbation: Secondary | ICD-10-CM

## 2014-09-04 DIAGNOSIS — J22 Unspecified acute lower respiratory infection: Secondary | ICD-10-CM

## 2014-09-04 DIAGNOSIS — R7309 Other abnormal glucose: Secondary | ICD-10-CM | POA: Diagnosis not present

## 2014-09-04 DIAGNOSIS — R7303 Prediabetes: Secondary | ICD-10-CM

## 2014-09-04 MED ORDER — HYDROCODONE-HOMATROPINE 5-1.5 MG/5ML PO SYRP: ORAL_SOLUTION | ORAL | Status: DC

## 2014-09-04 MED ORDER — IPRATROPIUM-ALBUTEROL 0.5-2.5 (3) MG/3ML IN SOLN
3.0000 mL | Freq: Once | RESPIRATORY_TRACT | Status: AC
  Administered 2014-09-04: 3 mL via RESPIRATORY_TRACT

## 2014-09-04 MED ORDER — METHYLPREDNISOLONE ACETATE 80 MG/ML IJ SUSP
120.0000 mg | Freq: Once | INTRAMUSCULAR | Status: AC
  Administered 2014-09-04: 120 mg via INTRAMUSCULAR

## 2014-09-04 MED ORDER — AZITHROMYCIN 250 MG PO TABS: 250.0000 mg | ORAL_TABLET | ORAL | Status: DC

## 2014-09-04 NOTE — Progress Notes (Signed)
Pre visit review using our clinic review tool, if applicable. No additional management support is needed unless otherwise documented below in the visit note.  Chief Complaint  Patient presents with  . Cough    HPI: Patient [name]  comes in today for SDA for  new problem evaluation. Here with daughter  On her reg inhaler and has had a 8 days of uri and chest congestion  With cough sweats at tnigh t>/ if changing doesn't know if has had fever  Had yellow phlegm and then stopped with chest tightness  Used albuterol neb bid w/temp some help .     None this am . Cough is bad   No one else sick  Has some sneezing at times  No itching  ROS: See pertinent positives and negatives per HPI. No hemoptysis new rash  New edema  pnd  Past Medical History  Diagnosis Date  . Asthma     " onset when young"   . History of DVT (deep vein thrombosis)     "RLE" (09/09/2012) coumadine cant take asa so on plavix   . Allergy   . GERD (gastroesophageal reflux disease)   . Hyperlipidemia   . Hypertension   . Varicose veins   . Lupus     "cured years ago" (09/09/2012)  . Headache(784.0)     "related to my high blood pressure" (09/09/2012)  . Arthritis     "shoulders" (09/09/2012)  . Anxiety   . Anemia     Family History  Problem Relation Age of Onset  . Hypertension Mother   . Hypertension Father   . Cancer Sister     Colon and Breast Cancer  . Hypertension Brother   . Heart attack Daughter   . Lung cancer      both parents     History   Social History  . Marital Status: Single    Spouse Name: N/A  . Number of Children: N/A  . Years of Education: N/A   Social History Main Topics  . Smoking status: Never Smoker   . Smokeless tobacco: Never Used  . Alcohol Use: No  . Drug Use: No  . Sexual Activity: No   Other Topics Concern  . None   Social History Narrative   2 people living in the home.  Grand daughter.   Up and down through the night      Had 7 children  2 deceased  . 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    works 3 days per week currently .   Glasses dentures  Neg tad     Outpatient Encounter Prescriptions as of 09/04/2014  Medication Sig  . acetaminophen (TYLENOL) 500 MG tablet Take 1,000 mg by mouth every 6 (six) hours as needed for moderate pain.  [name] ADVAIR DISKUS 250-50 MCG/DOSE AEPB USE 1 PUFF TWICE DAILY.  [name] albuterol (PROVENTIL) (2.5 MG/3ML) 0.083% nebulizer solution USE 1 AMPULE EVERY 6 HOURS AS NEEDED FOR WHEEZE  . benzonatate (TESSALON) 100 MG capsule Take 1 capsule (100 mg total) by mouth 3 (three) times daily as needed for cough.  . clopidogrel (PLAVIX) 75 MG tablet TAKE 1 TABLET ONCE DAILY. Hold wile on lovenox  . diclofenac sodium (VOLTAREN) 1 % GEL Apply 4 g topically 4 (four) times daily.  [name] doxazosin (CARDURA) 2 MG tablet TAKE 3 TABLETS AT BEDTIME.  . fenofibrate 160  MG tablet Take 1 tablet (160 mg total) by mouth daily.  . ferrous sulfate 325 (65 FE) MG EC tablet Take 1 tablet (325 mg total) by mouth daily with breakfast.  . ferrous sulfate 325 (65 FE) MG tablet TAKE ONE TABLET ONCE A DAY WITH BREAKFAST.  . furosemide (LASIX) 20 MG tablet TAKE 1 TABLET EACH DAY.  [name] guaiFENesin (MUCINEX) 600 MG 12 hr tablet Take 600 mg by mouth 2 (two) times daily as needed for cough or to loosen phlegm.  [name] LORazepam (ATIVAN) 1 MG tablet TAKE 1 TABLET EVERY 8 HOURS AS NEEDED FOR ANXIETY.  . metoprolol succinate (TOPROL-XL) 50 MG 24 hr tablet TAKE 1 TABLET ONCE DAILY WITH FOOD.  [name] montelukast (SINGULAIR) 10 MG tablet TAKE 1 TABLET ONCE DAILY.  [name] ondansetron (ZOFRAN) 4 MG tablet Take 1 tablet (4 mg total) by mouth every 8 (eight) hours as needed for nausea or vomiting.  . pantoprazole (PROTONIX) 40 MG tablet TAKE 1 TABLET ONCE DAILY.  [name] potassium chloride (K-DUR) 10 MEQ tablet Take 4 tablets (40 mEq total) by mouth daily. For one month  . PROAIR HFA 108 (90 BASE) MCG/ACT  inhaler USE 2 PUFFS EVERY 6 HOURS AS NEEDED FOR WHEEZING.  Jabier Gauss 40-10-25 MG TABS TAKE 1 TABLET ONCE DAILY.  . vitamin B-12 (CYANOCOBALAMIN) 1000 MCG tablet Take 1,000 mcg by mouth daily.  [name] azithromycin (ZITHROMAX Z-PAK) 250 MG tablet Take 1 tablet (250 mg total) by mouth as directed. Take 2 po first day, then 1 po qd  . HYDROcodone-homatropine (HYCODAN) 5-1.5 MG/5ML syrup 1 tsp at night or every 4-6 hours if needed for cough  . [EXPIRED] ipratropium-albuterol (DUONEB) 0.5-2.5 (3) MG/3ML nebulizer solution 3 mL   . [EXPIRED] methylPREDNISolone acetate (DEPO-MEDROL) injection 120 mg     EXAM:  BP 154/60 mmHg  Pulse 68  Temp(Src) 98.4 F (36.9 C) (Oral)  Ht 5\' 1"  (1.549 m)  Wt 175 lb (79.379 kg)  BMI 33.08 kg/m2  SpO2 98%  Body mass index is 33.08 kg/(m^2). WDWN in NAD  quiet respirations; mildly congested  somewhat hoarse. Non toxic . But uncomfortable  With deep wheezy cough bronchial sounding  HEENT: Normocephalic ;atraumatic , Eyes;  PERRL, EOMs  Full, lids and conjunctiva clear weepy ,,Ears: no deformities, canals nl, TM landmarks normal, Nose: no deformity or discharge but congested;face non  tender Mouth : OP clear without lesion or edema . Neck: Supple without adenopathy or masses or bruits Chest:   bs dec  Exp wheezing throughout. After neb inc bs  Looser wheeze ? If rhonchi  no rales  CV:  S1-S2 no gallops or murmurs peripheral perfusion is normal  Skin :nl perfusion and no acute rashes  PSYCH: pleasant and cooperative, no obvious depression or anxiety Neb given and observed reassessed after neb ASSESSMENT AND PLAN:  Discussed the following assessment and plan:  Pre-diabetes vs early DM  - warned sgteroid elevation of bgtemporoary  Asthmatic bronchitis with acute exacerbation - underlying rad  on advair  - Plan: ipratropium-albuterol (DUONEB) 0.5-2.5 (3) MG/3ML nebulizer solution 3 mL, methylPREDNISolone acetate (DEPO-MEDROL) injection 120 mg  Acute respiratory  infection clos observation  Advised if worsening over weekend  Or as needed   -Patient advised to return or notify health care team  if symptoms worsen ,persist or new concerns arise.  Patient Instructions  Can use nebulizer or albuterol inhaler every 6 hours if needed for wheezing  antibiotic and  Steroid  depomedrol 120 treatment to calm down the  Wheezing   Expect improvement in the next 3 days    If worsening fever  shortness of breath seek urgent care over weekend.  Steroids can increase blood sugars temporarily    Mariann Laster K. Jaidyn Kuhl M.D.

## 2014-09-04 NOTE — Patient Instructions (Addendum)
Can use nebulizer or albuterol inhaler every 6 hours if needed for wheezing  antibiotic and  Steroid  depomedrol 120 treatment to calm down the  Wheezing   Expect improvement in the next 3 days    If worsening fever  shortness of breath seek urgent care over weekend.  Steroids can increase blood sugars temporarily

## 2014-09-09 ENCOUNTER — Other Ambulatory Visit: Payer: Self-pay | Admitting: Internal Medicine

## 2014-09-09 NOTE — Telephone Encounter (Signed)
Sent to the pharmacy by e-scribe. 

## 2014-09-14 ENCOUNTER — Other Ambulatory Visit: Payer: Self-pay | Admitting: Internal Medicine

## 2014-09-14 ENCOUNTER — Ambulatory Visit: Payer: Medicare Other | Admitting: Internal Medicine

## 2014-09-14 ENCOUNTER — Other Ambulatory Visit (INDEPENDENT_AMBULATORY_CARE_PROVIDER_SITE_OTHER): Payer: Medicare Other

## 2014-09-14 DIAGNOSIS — R739 Hyperglycemia, unspecified: Secondary | ICD-10-CM

## 2014-09-14 DIAGNOSIS — I10 Essential (primary) hypertension: Secondary | ICD-10-CM | POA: Diagnosis not present

## 2014-09-14 DIAGNOSIS — E785 Hyperlipidemia, unspecified: Secondary | ICD-10-CM

## 2014-09-14 LAB — LIPID PANEL
CHOLESTEROL: 133 mg/dL (ref 0–200)
HDL: 46.8 mg/dL (ref >39.00)
LDL CALC: 69 mg/dL (ref 0–99)
NonHDL: 86.2
TRIGLYCERIDES: 84 mg/dL (ref 0.0–149.0)
Total CHOL/HDL Ratio: 3
VLDL: 16.8 mg/dL (ref 0.0–40.0)

## 2014-09-14 LAB — BASIC METABOLIC PANEL
BUN: 28 mg/dL — AB (ref 6–23)
CALCIUM: 9.8 mg/dL (ref 8.4–10.5)
CHLORIDE: 100 meq/L (ref 96–112)
CO2: 29 meq/L (ref 19–32)
CREATININE: 1.6 mg/dL — AB (ref 0.40–1.20)
GFR: 40.02 mL/min — AB (ref >60.00)
Glucose, Bld: 119 mg/dL — ABNORMAL HIGH (ref 70–99)
POTASSIUM: 3.8 meq/L (ref 3.5–5.1)
Sodium: 137 mEq/L (ref 135–145)

## 2014-09-14 LAB — HEMOGLOBIN A1C: Hgb A1c MFr Bld: 6.6 % — ABNORMAL HIGH (ref 4.6–6.5)

## 2014-09-14 NOTE — Telephone Encounter (Signed)
Ok to refill x 3 months   BP Readings from Last 3 Encounters:  09/04/14 154/60  08/04/14 134/70  07/13/14 152/50

## 2014-09-15 NOTE — Telephone Encounter (Signed)
Sent to the pharmacy by e-scribe. 

## 2014-09-17 ENCOUNTER — Encounter (HOSPITAL_COMMUNITY): Payer: Self-pay | Admitting: *Deleted

## 2014-09-17 ENCOUNTER — Emergency Department (HOSPITAL_COMMUNITY)
Admission: EM | Admit: 2014-09-17 | Discharge: 2014-09-17 | Disposition: A | Discharge status: Home / Self Care | Payer: Medicare Other | Source: Home / Self Care | Attending: Family Medicine | Admitting: Family Medicine

## 2014-09-17 DIAGNOSIS — E785 Hyperlipidemia, unspecified: Secondary | ICD-10-CM | POA: Diagnosis not present

## 2014-09-17 DIAGNOSIS — J45901 Unspecified asthma with (acute) exacerbation: Secondary | ICD-10-CM

## 2014-09-17 HISTORY — DX: Type 2 diabetes mellitus without complications: E11.9

## 2014-09-17 LAB — GLUCOSE, CAPILLARY: Glucose-Capillary: 108 mg/dL — ABNORMAL HIGH (ref 70–99)

## 2014-09-17 MED ORDER — ALBUTEROL SULFATE (2.5 MG/3ML) 0.083% IN NEBU
INHALATION_SOLUTION | RESPIRATORY_TRACT | Status: AC
  Filled 2014-09-17: qty 6

## 2014-09-17 MED ORDER — ALBUTEROL SULFATE (2.5 MG/3ML) 0.083% IN NEBU
5.0000 mg | INHALATION_SOLUTION | Freq: Once | RESPIRATORY_TRACT | Status: AC
  Administered 2014-09-17: 5 mg via RESPIRATORY_TRACT

## 2014-09-17 MED ORDER — IPRATROPIUM BROMIDE 0.02 % IN SOLN
0.5000 mg | Freq: Once | RESPIRATORY_TRACT | Status: AC
  Administered 2014-09-17: 0.5 mg via RESPIRATORY_TRACT

## 2014-09-17 MED ORDER — METHYLPREDNISOLONE SODIUM SUCC 125 MG IJ SOLR
125.0000 mg | Freq: Once | INTRAMUSCULAR | Status: AC
  Administered 2014-09-17: 125 mg via INTRAMUSCULAR

## 2014-09-17 MED ORDER — METHYLPREDNISOLONE SODIUM SUCC 125 MG IJ SOLR
INTRAMUSCULAR | Status: AC
  Filled 2014-09-17: qty 2

## 2014-09-17 MED ORDER — IPRATROPIUM BROMIDE 0.02 % IN SOLN
RESPIRATORY_TRACT | Status: AC
  Filled 2014-09-17: qty 2.5

## 2014-09-17 MED ORDER — PREDNISONE 50 MG PO TABS: ORAL_TABLET | ORAL | Status: DC

## 2014-09-17 NOTE — ED Notes (Signed)
C/O cough x 3 wks; has seen PCP twice.  Finished Z-pak last week with slight improvement.  C/O soreness due to coughing.  Has been taking albuterol MDI & nebs regularly, along with Mucinex.  Denies any fevers.

## 2014-09-17 NOTE — ED Provider Notes (Signed)
CSN: [id number]     Arrival date & time 09/17/14  1937 History   First MD Initiated Contact with Patient 09/17/14 1959     Chief Complaint  Patient presents with  . Cough   (Consider location/radiation/quality/duration/timing/severity/associated sxs/prior Treatment) Patient is a 81 y.o. female presenting with cough. The history is provided by the patient and a relative.  Cough Cough characteristics:  Non-productive and dry Severity:  Mild Onset quality:  Gradual Duration:  3 weeks Progression:  Unchanged Chronicity:  Chronic Smoker: no   Context comment:  Given z-pack without relief. Relieved by:  None tried Worsened by:  Nothing tried Ineffective treatments:  None tried Associated symptoms: rhinorrhea, shortness of breath and wheezing   Associated symptoms: no chest pain and no fever     Past Medical History  Diagnosis Date  . Asthma     " onset when young"   . History of DVT (deep vein thrombosis)     "RLE" (09/09/2012) coumadine cant take asa so on plavix   . Allergy   . GERD (gastroesophageal reflux disease)   . Hyperlipidemia   . Hypertension   . Varicose veins   . Lupus     "cured years ago" (09/09/2012)  . Headache(784.0)     "related to my high blood pressure" (09/09/2012)  . Arthritis     "shoulders" (09/09/2012)  . Anxiety   . Anemia   . Diabetes mellitus without complication     "Borderline" per pt; being monitored   Past Surgical History  Procedure Laterality Date  . Abdominal hysterectomy      partial  . Breast surgery    . Carpal tunnel release Right 2000's  . Shoulder open rotator cuff repair Bilateral 2000's  . Carpal tunnel release Bilateral 10/21/2013    Procedure: BILATERAL  CARPAL TUNNEL RELEASE;  Surgeon: [name], MD;  Location: [facility];  Service: Orthopedics;  Laterality: Bilateral;  . Trigger finger release Right 10/21/2013    Procedure: RELEASE TRIGGER FINGER/A-1 PULLEY RIGHT MIDDLE AND RIGHT RING;  Surgeon: [name],  MD;  Location: [facility];  Service: Orthopedics;  Laterality: Right;   Family History  Problem Relation Age of Onset  . Hypertension Mother   . Hypertension Father   . Cancer Sister     Colon and Breast Cancer  . Hypertension Brother   . Heart attack Daughter   . Lung cancer      both parents    History  Substance Use Topics  . Smoking status: Never Smoker   . Smokeless tobacco: Never Used  . Alcohol Use: No   OB History    Gravida Para Term Preterm AB TAB SAB Ectopic Multiple Living   7 7              Obstetric Comments   Lost 2 children soon after birth     Review of Systems  Constitutional: Negative.  Negative for fever.  HENT: Positive for congestion, postnasal drip and rhinorrhea.   Respiratory: Positive for cough, shortness of breath and wheezing.   Cardiovascular: Negative for chest pain, palpitations and leg swelling.  Gastrointestinal: Negative.     Allergies  Penicillins and Aspirin  Home Medications   Prior to Admission medications   Medication Sig Start Date End Date Taking? Authorizing Provider  ADVAIR DISKUS 250-50 MCG/DOSE AEPB USE 1 PUFF TWICE DAILY.   Yes Burnis Medin, MD  albuterol (PROVENTIL) (2.5 MG/3ML) 0.083% nebulizer solution USE 1 AMPULE  EVERY 6 HOURS AS NEEDED FOR WHEEZE 09/09/14  Yes Burnis Medin, MD  clopidogrel (PLAVIX) 75 MG tablet TAKE 1 TABLET ONCE DAILY. Hold wile on lovenox 03/27/14  Yes Burnis Medin, MD  doxazosin (CARDURA) 2 MG tablet TAKE 3 TABLETS AT BEDTIME. 09/15/14  Yes Burnis Medin, MD  ferrous sulfate 325 (65 FE) MG EC tablet Take 1 tablet (325 mg total) by mouth daily with breakfast. 05/30/13  Yes Concha Norway, MD  furosemide (LASIX) 20 MG tablet TAKE 1 TABLET EACH DAY.   Yes Burnis Medin, MD  guaiFENesin (MUCINEX) 600 MG 12 hr tablet Take 600 mg by mouth 2 (two) times daily as needed for cough or to loosen phlegm.   Yes Historical Provider, MD  HYDROcodone-homatropine [facility]) 5-1.5 MG/5ML syrup 1 tsp  at night or every 4-6 hours if needed for cough 09/04/14  Yes Burnis Medin, MD  LORazepam (ATIVAN) 1 MG tablet TAKE 1 TABLET EVERY 8 HOURS AS NEEDED FOR ANXIETY. 08/14/14  Yes Burnis Medin, MD  metoprolol succinate (TOPROL-XL) 50 MG 24 hr tablet TAKE 1 TABLET ONCE DAILY WITH FOOD. 04/17/14  Yes Burnis Medin, MD  pantoprazole (PROTONIX) 40 MG tablet TAKE 1 TABLET ONCE DAILY. 05/05/14  Yes Burnis Medin, MD  potassium chloride (K-DUR) 10 MEQ tablet Take 4 tablets (40 mEq total) by mouth daily. For one month 05/14/14  Yes Burnis Medin, MD  PROAIR HFA 108 (90 BASE) MCG/ACT inhaler USE 2 PUFFS EVERY 6 HOURS AS NEEDED FOR WHEEZING. 07/13/14  Yes Burnis Medin, MD  TRIBENZOR 40-10-25 MG TABS TAKE 1 TABLET ONCE DAILY. 05/05/14  Yes Burnis Medin, MD  vitamin B-12 (CYANOCOBALAMIN) 1000 MCG tablet Take 1,000 mcg by mouth daily.   Yes Historical Provider, MD  acetaminophen (TYLENOL) 500 MG tablet Take 1,000 mg by mouth every 6 (six) hours as needed for moderate pain.    Historical Provider, MD  azithromycin (ZITHROMAX Z-PAK) 250 MG tablet Take 1 tablet (250 mg total) by mouth as directed. Take 2 po first day, then 1 po qd 09/04/14   Burnis Medin, MD  benzonatate (TESSALON) 100 MG capsule Take 1 capsule (100 mg total) by mouth 3 (three) times daily as needed for cough. 07/12/14   Audelia Hives Presson, PA  diclofenac sodium (VOLTAREN) 1 % GEL Apply 4 g topically 4 (four) times daily. 06/15/14   Burnis Medin, MD  fenofibrate 160 MG tablet Take 1 tablet (160 mg total) by mouth daily. 05/14/14   Burnis Medin, MD  ferrous sulfate 325 (65 FE) MG tablet TAKE ONE TABLET ONCE A DAY WITH BREAKFAST. 08/28/14   Burnis Medin, MD  montelukast (SINGULAIR) 10 MG tablet TAKE 1 TABLET ONCE DAILY. 05/05/14   Burnis Medin, MD  ondansetron (ZOFRAN) 4 MG tablet Take 1 tablet (4 mg total) by mouth every 8 (eight) hours as needed for nausea or vomiting. 07/12/14   Lutricia Feil, PA  predniSONE (DELTASONE) 50 MG  tablet 1 tab daily for 2 days then 1/2 tab daily for 2 days. Start on sat. 09/19/14   Billy Fischer, MD   BP 166/64 mmHg  Pulse 67  Temp(Src) 98.1 F (36.7 C) (Oral)  Resp 20  SpO2 98% Physical Exam  Constitutional: She is oriented to person, place, and time. She appears well-developed and well-nourished. No distress.  HENT:  Right Ear: External ear normal.  Left Ear: External ear normal.  Mouth/Throat: Oropharynx is clear  and moist.  Neck: Normal range of motion. Neck supple.  Cardiovascular: Normal rate, regular rhythm, normal heart sounds and intact distal pulses.   Pulmonary/Chest: She has wheezes. She exhibits no tenderness.  Musculoskeletal: She exhibits no edema.  Lymphadenopathy:    She has no cervical adenopathy.  Neurological: She is alert and oriented to person, place, and time.  Skin: Skin is warm and dry.  Nursing note and vitals reviewed.   ED Course  Procedures (including critical care time) Labs Review Labs Reviewed  GLUCOSE, CAPILLARY - Abnormal; Notable for the following:    Glucose-Capillary 108 (*)    All other components within normal limits    Imaging Review No results found.   MDM   1. Allergic asthma with acute exacerbation    Sx improved and lungs clear after neb and inj.     Billy Fischer, MD 09/17/14 [phone]

## 2014-09-18 DIAGNOSIS — M5442 Lumbago with sciatica, left side: Secondary | ICD-10-CM | POA: Diagnosis not present

## 2014-09-25 ENCOUNTER — Ambulatory Visit: Payer: Medicare Other | Admitting: Dietician

## 2014-10-05 ENCOUNTER — Other Ambulatory Visit: Payer: Self-pay | Admitting: Internal Medicine

## 2014-10-05 DIAGNOSIS — H04201 Unspecified epiphora, right lacrimal gland: Secondary | ICD-10-CM | POA: Diagnosis not present

## 2014-10-05 DIAGNOSIS — H04203 Unspecified epiphora, bilateral lacrimal glands: Secondary | ICD-10-CM | POA: Diagnosis not present

## 2014-10-05 DIAGNOSIS — H04202 Unspecified epiphora, left lacrimal gland: Secondary | ICD-10-CM | POA: Diagnosis not present

## 2014-10-05 NOTE — Telephone Encounter (Signed)
Call in #30 with no rf  

## 2014-10-06 NOTE — Telephone Encounter (Signed)
Called to the pharmacy and left on machine. 

## 2014-10-09 ENCOUNTER — Other Ambulatory Visit: Payer: Self-pay | Admitting: Internal Medicine

## 2014-10-11 NOTE — Telephone Encounter (Signed)
This was refilled per dr Sarajane Jews as i was out of the office this week.

## 2014-10-13 NOTE — Telephone Encounter (Signed)
Denied.  Called in on 10/06/14 for #24.

## 2014-10-14 ENCOUNTER — Telehealth: Payer: Self-pay

## 2014-10-14 ENCOUNTER — Other Ambulatory Visit: Payer: Self-pay | Admitting: Internal Medicine

## 2014-10-14 NOTE — Telephone Encounter (Signed)
Called to the pharmacy on 10/06/14.

## 2014-10-14 NOTE — Telephone Encounter (Signed)
[place], [place] - [address]: LORazepam (ATIVAN) 1 MG tablet

## 2014-10-15 NOTE — Telephone Encounter (Signed)
Ok x 5  Refill for both

## 2014-10-15 NOTE — Telephone Encounter (Signed)
Sent to the pharmacy by e-scribe. 

## 2014-10-30 ENCOUNTER — Other Ambulatory Visit: Payer: Self-pay | Admitting: Internal Medicine

## 2014-10-30 NOTE — Telephone Encounter (Signed)
Sent to the pharmacy by e-scribe. 

## 2014-11-02 ENCOUNTER — Ambulatory Visit (INDEPENDENT_AMBULATORY_CARE_PROVIDER_SITE_OTHER): Payer: Medicare Other | Admitting: Internal Medicine

## 2014-11-02 ENCOUNTER — Encounter: Payer: Self-pay | Admitting: Internal Medicine

## 2014-11-02 VITALS — BP 148/60 | Temp 98.2°F | Ht 61.0 in | Wt 171.1 lb

## 2014-11-02 DIAGNOSIS — N289 Disorder of kidney and ureter, unspecified: Secondary | ICD-10-CM

## 2014-11-02 DIAGNOSIS — R7309 Other abnormal glucose: Secondary | ICD-10-CM

## 2014-11-02 DIAGNOSIS — D638 Anemia in other chronic diseases classified elsewhere: Secondary | ICD-10-CM

## 2014-11-02 DIAGNOSIS — I10 Essential (primary) hypertension: Secondary | ICD-10-CM

## 2014-11-02 DIAGNOSIS — R7303 Prediabetes: Secondary | ICD-10-CM

## 2014-11-02 DIAGNOSIS — F411 Generalized anxiety disorder: Secondary | ICD-10-CM

## 2014-11-02 LAB — BASIC METABOLIC PANEL
BUN: 36 mg/dL — ABNORMAL HIGH (ref 6–23)
CHLORIDE: 96 meq/L (ref 96–112)
CO2: 33 mEq/L — ABNORMAL HIGH (ref 19–32)
Calcium: 10.2 mg/dL (ref 8.4–10.5)
Creatinine, Ser: 1.45 mg/dL — ABNORMAL HIGH (ref 0.40–1.20)
GFR: 44.82 mL/min — ABNORMAL LOW (ref >60.00)
Glucose, Bld: 116 mg/dL — ABNORMAL HIGH (ref 70–99)
POTASSIUM: 4.4 meq/L (ref 3.5–5.1)
Sodium: 135 mEq/L (ref 135–145)

## 2014-11-02 NOTE — Progress Notes (Signed)
Pre visit review using our clinic review tool, if applicable. No additional management support is needed unless otherwise documented below in the visit note.  Chief Complaint  Patient presents with  . Follow-up    HPI: [name] 81 y.o.  comes in for chronic disease/ medication management  Here with family member  Bp:   Up and down .  Resp: to ed in may   Given predshot of  Steroid and pred  .   ibhaler helsp . s loy better now Anxiety:  took ativan during illness  Now 4 months .  Anemia : TAKING IRON PILLS   Seeing hematology  Back radiation :  Had injection and helped a good bit  Steroid  ROS: See pertinent positives and negatives per HPI. No syncop stocking are hot this summer  Past Medical History  Diagnosis Date  . Asthma     " onset when young"   . History of DVT (deep vein thrombosis)     "RLE" (09/09/2012) coumadine cant take asa so on plavix   . Allergy   . GERD (gastroesophageal reflux disease)   . Hyperlipidemia   . Hypertension   . Varicose veins   . Lupus     "cured years ago" (09/09/2012)  . Headache(784.0)     "related to my high blood pressure" (09/09/2012)  . Arthritis     "shoulders" (09/09/2012)  . Anxiety   . Anemia   . Diabetes mellitus without complication     "Borderline" per pt; being monitored    Family History  Problem Relation Age of Onset  . Hypertension Mother   . Hypertension Father   . Cancer Sister     Colon and Breast Cancer  . Hypertension Brother   . Heart attack Daughter   . Lung cancer      both parents     History   Social History  . Marital Status: Single    Spouse Name: N/A  . Number of Children: N/A  . Years of Education: N/A   Social History Main Topics  . Smoking status: Never Smoker   . Smokeless tobacco: Never Used  . Alcohol Use: No  . Drug Use: No  . Sexual Activity: Not on file   Other Topics Concern  . None   Social History Narrative   2 people living in the home.  Grand daughter.   Up and  down through the night      Had 7 children  2 deceased . 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    works 3 days per week currently .   Glasses dentures  Neg tad     Outpatient Prescriptions Prior to Visit  Medication Sig Dispense Refill  . acetaminophen (TYLENOL) 500 MG tablet Take 1,000 mg by mouth every 6 (six) hours as needed for moderate pain.    [name] ADVAIR DISKUS 250-50 MCG/DOSE AEPB USE 1 PUFF TWICE DAILY. 60 each 5  . albuterol (PROVENTIL) (2.5 MG/3ML) 0.083% nebulizer solution USE 1 AMPULE EVERY 6 HOURS AS NEEDED FOR WHEEZE 75 mL 1  . clopidogrel (PLAVIX) 75 MG tablet TAKE 1 TABLET ONCE DAILY. 30 tablet 4  . diclofenac sodium (VOLTAREN) 1 % GEL Apply 4 g topically 4 (four) times daily. 100 g 0  . doxazosin (CARDURA) 2 MG tablet TAKE 3 TABLETS AT BEDTIME. 90 tablet 2  . fenofibrate 160 MG  tablet Take 1 tablet (160 mg total) by mouth daily. 90 tablet 1  . ferrous sulfate 325 (65 FE) MG EC tablet Take 1 tablet (325 mg total) by mouth daily with breakfast. 30 tablet 3  . ferrous sulfate 325 (65 FE) MG tablet TAKE ONE TABLET ONCE A DAY WITH BREAKFAST. 100 tablet 0  . furosemide (LASIX) 20 MG tablet TAKE 1 TABLET EACH DAY. 30 tablet 11  . guaiFENesin (MUCINEX) 600 MG 12 hr tablet Take 600 mg by mouth 2 (two) times daily as needed for cough or to loosen phlegm.    [name] LORazepam (ATIVAN) 1 MG tablet TAKE 1 TABLET EVERY 8 HOURS AS NEEDED FOR ANXIETY. 24 tablet 0  . metoprolol succinate (TOPROL-XL) 50 MG 24 hr tablet TAKE 1 TABLET ONCE DAILY WITH FOOD. 30 tablet 4  . montelukast (SINGULAIR) 10 MG tablet TAKE 1 TABLET ONCE DAILY. 90 tablet 1  . ondansetron (ZOFRAN) 4 MG tablet Take 1 tablet (4 mg total) by mouth every 8 (eight) hours as needed for nausea or vomiting. 12 tablet 0  . pantoprazole (PROTONIX) 40 MG tablet TAKE 1 TABLET ONCE DAILY. 30 tablet 5  . potassium chloride (K-DUR) 10 MEQ tablet Take 4 tablets (40 mEq  total) by mouth daily. For one month 120 tablet 5  . PROAIR HFA 108 (90 BASE) MCG/ACT inhaler USE 2 PUFFS EVERY 6 HOURS AS NEEDED FOR WHEEZING. 8.5 g 2  . TRIBENZOR 40-10-25 MG TABS TAKE 1 TABLET ONCE DAILY. 30 tablet 5  . vitamin B-12 (CYANOCOBALAMIN) 1000 MCG tablet Take 1,000 mcg by mouth daily.    [name] azithromycin (ZITHROMAX Z-PAK) 250 MG tablet Take 1 tablet (250 mg total) by mouth as directed. Take 2 po first day, then 1 po qd 6 each 0  . benzonatate (TESSALON) 100 MG capsule Take 1 capsule (100 mg total) by mouth 3 (three) times daily as needed for cough. 21 capsule 0  . HYDROcodone-homatropine (HYCODAN) 5-1.5 MG/5ML syrup 1 tsp at night or every 4-6 hours if needed for cough 180 mL 0  . predniSONE (DELTASONE) 50 MG tablet 1 tab daily for 2 days then 1/2 tab daily for 2 days. Start on sat. 3 tablet 0   No facility-administered medications prior to visit.     EXAM:  BP 148/60 mmHg  Temp(Src) 98.2 F (36.8 C) (Oral)  Ht 5\' 1"  (1.549 m)  Wt 171 lb 1.6 oz (77.61 kg)  BMI 32.35 kg/m2  Body mass index is 32.35 kg/(m^2).  GENERAL: vitals reviewed and listed above, alert, oriented, appears well hydrated and in no acute distress HEENT: atraumatic, conjunctiva  clear, no obvious abnormalities on inspection of external nose and ears   NECK: no obvious masses on inspection palpation  LUNGS: clear to auscultation bilaterally, no wheezes, rales or rhonchi, good air movement CV: HRRR, no clubbing cyanosis or  peripheral edema nl cap refill  MS: moves all extremities without noticeable focal  Abnormality waling a lot better less pain  PSYCH: pleasant and cooperative, no obvious depression or anxiety BP Readings from Last 3 Encounters:  11/02/14 148/60  09/17/14 166/64  09/04/14 154/60   Wt Readings from Last 3 Encounters:  11/02/14 171 lb 1.6 oz (77.61 kg)  09/04/14 175 lb (79.379 kg)  08/14/14 176 lb (79.833 kg)   Lab Results  Component Value Date   WBC 4.5 05/25/2014   HGB 10.5*  05/25/2014   HCT 33.4* 05/25/2014   PLT 216 05/25/2014   GLUCOSE 116* 11/02/2014   CHOL  133 09/14/2014   TRIG 84.0 09/14/2014   HDL 46.80 09/14/2014   LDLCALC 69 09/14/2014   ALT 13 02/16/2014   AST 18 02/16/2014   NA 135 11/02/2014   K 4.4 11/02/2014   CL 96 11/02/2014   CREATININE 1.45* 11/02/2014   BUN 36* 11/02/2014   CO2 33* 11/02/2014   TSH 1.99 04/27/2014   INR 0.94 12/22/2009   HGBA1C 6.6* 09/14/2014   Wt Readings from Last 3 Encounters:  11/02/14 171 lb 1.6 oz (77.61 kg)  09/04/14 175 lb (79.379 kg)  08/14/14 176 lb (79.833 kg)   BP Readings from Last 3 Encounters:  11/02/14 148/60  09/17/14 166/64  09/04/14 154/60     ASSESSMENT AND PLAN:  Discussed the following assessment and plan:  Essential hypertension - hard to control not optimal but better after resting - Plan: Basic metabolic panel  early DM  - a1c now 6   much better   Renal insufficiency creatinine 1.3 - cr up to 1.6  ? bp control very hard to control at this time .   if not improving consider [place] orther renal opinion - Plan: Basic metabolic panel  Anemia of chronic disease  Anxiety state - better now  occ use of benzo -Patient advised to return or notify health care team  if symptoms worsen ,persist or new concerns arise.  Lab today ate at about 730 this am.   Patient Instructions  Glad you are doing .  Better .  Goal bp below 150 best. Repeat bp readings was better today .  Will notify you  of labs when available.  Will  Plan fu depenidng on labs   [place] K. Kadin Canipe M.D.

## 2014-11-02 NOTE — Patient Instructions (Signed)
Glad you are doing .  Better .  Goal bp below 150 best. Repeat bp readings was better today .  Will notify you  of labs when available.  Will  Plan fu depenidng on labs

## 2014-11-03 ENCOUNTER — Telehealth: Payer: Self-pay | Admitting: Family Medicine

## 2014-11-03 ENCOUNTER — Other Ambulatory Visit: Payer: Self-pay | Admitting: Family Medicine

## 2014-11-03 DIAGNOSIS — N289 Disorder of kidney and ureter, unspecified: Secondary | ICD-10-CM

## 2014-11-03 DIAGNOSIS — R7303 Prediabetes: Secondary | ICD-10-CM

## 2014-11-03 NOTE — Telephone Encounter (Signed)
Please schedule for rov for 4 months and lab work (Bmp and hga1c) before her visit.  I have placed the lab orders.

## 2014-11-05 NOTE — Telephone Encounter (Signed)
lmom for pt to callback and sch °

## 2014-11-06 NOTE — Telephone Encounter (Signed)
Pt has been sch

## 2014-11-09 ENCOUNTER — Other Ambulatory Visit: Payer: Self-pay

## 2014-11-11 ENCOUNTER — Other Ambulatory Visit: Payer: Self-pay | Admitting: Internal Medicine

## 2014-11-11 ENCOUNTER — Other Ambulatory Visit: Payer: Self-pay | Admitting: Family Medicine

## 2014-11-11 NOTE — Telephone Encounter (Signed)
Sent to the pharmacy by e-scribe. 

## 2014-11-12 ENCOUNTER — Other Ambulatory Visit: Payer: Self-pay | Admitting: *Deleted

## 2014-11-12 NOTE — Telephone Encounter (Signed)
This patient has never been seen. Could the rx be for another patient?

## 2014-11-13 ENCOUNTER — Other Ambulatory Visit: Payer: Self-pay | Admitting: Internal Medicine

## 2014-11-13 MED ORDER — LORAZEPAM 1 MG PO TABS: ORAL_TABLET | ORAL | Status: DC

## 2014-11-13 NOTE — Telephone Encounter (Signed)
Called to the pharmacy and left on machine. 

## 2014-11-13 NOTE — Telephone Encounter (Signed)
Sent to the pharmacy by e-scribe.  Last filled 06/2014

## 2014-11-13 NOTE — Telephone Encounter (Signed)
Ok to refill x 1  

## 2014-11-18 ENCOUNTER — Other Ambulatory Visit: Payer: Self-pay | Admitting: Internal Medicine

## 2014-11-19 NOTE — Telephone Encounter (Signed)
Sent to the pharmacy by e-scribe. 

## 2014-11-23 ENCOUNTER — Telehealth: Payer: Self-pay | Admitting: Hematology

## 2014-11-23 ENCOUNTER — Ambulatory Visit (HOSPITAL_BASED_OUTPATIENT_CLINIC_OR_DEPARTMENT_OTHER): Payer: Medicare Other | Admitting: Hematology

## 2014-11-23 ENCOUNTER — Other Ambulatory Visit (HOSPITAL_BASED_OUTPATIENT_CLINIC_OR_DEPARTMENT_OTHER): Payer: Medicare Other

## 2014-11-23 ENCOUNTER — Encounter: Payer: Self-pay | Admitting: Hematology

## 2014-11-23 VITALS — BP 146/47 | HR 65 | Temp 98.8°F | Resp 18 | Ht 61.0 in | Wt 173.4 lb

## 2014-11-23 DIAGNOSIS — Z86718 Personal history of other venous thrombosis and embolism: Secondary | ICD-10-CM | POA: Diagnosis not present

## 2014-11-23 DIAGNOSIS — D631 Anemia in chronic kidney disease: Secondary | ICD-10-CM | POA: Diagnosis not present

## 2014-11-23 DIAGNOSIS — N183 Chronic kidney disease, stage 3 (moderate): Secondary | ICD-10-CM | POA: Diagnosis not present

## 2014-11-23 DIAGNOSIS — J4520 Mild intermittent asthma, uncomplicated: Secondary | ICD-10-CM

## 2014-11-23 DIAGNOSIS — D649 Anemia, unspecified: Secondary | ICD-10-CM

## 2014-11-23 LAB — CBC & DIFF AND RETIC
BASO%: 0.6 % (ref 0.0–2.0)
Basophils Absolute: 0 10*3/uL (ref 0.0–0.1)
EOS%: 2.2 % (ref 0.0–7.0)
Eosinophils Absolute: 0.1 10*3/uL (ref 0.0–0.5)
HCT: 29.4 % — ABNORMAL LOW (ref 34.8–46.6)
HGB: 9.9 g/dL — ABNORMAL LOW (ref 11.6–15.9)
IMMATURE RETIC FRACT: 0.3 % — AB (ref 1.60–10.00)
LYMPH%: 42.9 % (ref 14.0–49.7)
MCH: 28.4 pg (ref 25.1–34.0)
MCHC: 33.7 g/dL (ref 31.5–36.0)
MCV: 84.2 fL (ref 79.5–101.0)
MONO#: 0.3 10*3/uL (ref 0.1–0.9)
MONO%: 8.3 % (ref 0.0–14.0)
NEUT#: 1.5 10*3/uL (ref 1.5–6.5)
NEUT%: 46 % (ref 38.4–76.8)
PLATELETS: 229 10*3/uL (ref 145–400)
RBC: 3.49 10*6/uL — AB (ref 3.70–5.45)
RDW: 13.4 % (ref 11.2–14.5)
RETIC %: 1.09 % (ref 0.70–2.10)
RETIC CT ABS: 38.04 10*3/uL (ref 33.70–90.70)
WBC: 3.2 10*3/uL — ABNORMAL LOW (ref 3.9–10.3)
lymph#: 1.4 10*3/uL (ref 0.9–3.3)

## 2014-11-23 LAB — COMPREHENSIVE METABOLIC PANEL (CC13)
ALK PHOS: 35 U/L — AB (ref 40–150)
ALT: 14 U/L (ref 0–55)
ANION GAP: 9 meq/L (ref 3–11)
AST: 23 U/L (ref 5–34)
Albumin: 3.6 g/dL (ref 3.5–5.0)
BUN: 29.4 mg/dL — AB (ref 7.0–26.0)
CHLORIDE: 95 meq/L — AB (ref 98–109)
CO2: 28 meq/L (ref 22–29)
CREATININE: 1.4 mg/dL — AB (ref 0.6–1.1)
Calcium: 9.4 mg/dL (ref 8.4–10.4)
EGFR: 41 mL/min/{1.73_m2} — ABNORMAL LOW (ref >90)
GLUCOSE: 123 mg/dL (ref 70–140)
Potassium: 4.2 mEq/L (ref 3.5–5.1)
SODIUM: 131 meq/L — AB (ref 136–145)
Total Bilirubin: 0.32 mg/dL (ref 0.20–1.20)
Total Protein: 6.2 g/dL — ABNORMAL LOW (ref 6.4–8.3)

## 2014-11-23 LAB — IRON AND TIBC CHCC
%SAT: 18 % — AB (ref 21–57)
Iron: 60 ug/dL (ref 41–142)
TIBC: 325 ug/dL (ref 236–444)
UIBC: 265 ug/dL (ref 120–384)

## 2014-11-23 LAB — FERRITIN CHCC: Ferritin: 112 ng/ml (ref 9–269)

## 2014-11-23 NOTE — Progress Notes (Signed)
[facility] HEMATOLOGY OFFICE PROGRESS NOTE Date of Visit: 02/16/2014  Angela Dawson, MD [place] [id number]  DIAGNOSIS: No diagnosis found.  CHIEF COMPLAIN: follow up anemia   CURRENT THERAPY: Ferrous sulfate 325 mg daily with orange juice and B12 once daily .   INTERVAL HISTORY: She returns for follow up. She complains about leg cramps, she is on iron and potasium supplement, not on calcium. She also reports worsening fatigue lately, no chest pain or other new complains. 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    MEDICAL HISTORY:  Past Medical History  Diagnosis Date  . Asthma     " onset when young"   . History of DVT (deep vein thrombosis)     "RLE" (09/09/2012) coumadine cant take asa so on plavix   . Allergy   . GERD (gastroesophageal reflux disease)   . Hyperlipidemia   . Hypertension   . Varicose veins   . Lupus     "cured years ago" (09/09/2012)  . Headache(784.0)     "related to my high blood pressure" (09/09/2012)  . Arthritis     "shoulders" (09/09/2012)  . Anxiety   . Anemia   . Diabetes mellitus without complication     "Borderline" per pt; being monitored     ALLERGIES:  is allergic to penicillins and aspirin.  MEDICATIONS:   Medication List       This list is accurate as of: 11/23/14 10:18 AM.  Always use your most recent med list.               acetaminophen 500 MG tablet  Commonly known as:  TYLENOL  Take 1,000 mg by mouth every 6 (six) hours as needed for moderate pain.     ADVAIR DISKUS 250-50 MCG/DOSE Aepb  Generic drug:  Fluticasone-Salmeterol  USE 1 PUFF TWICE DAILY.     albuterol (2.5 MG/3ML) 0.083% nebulizer solution  Commonly known as:  PROVENTIL  USE 1 AMPULE EVERY 6 HOURS AS NEEDED FOR WHEEZE     PROAIR HFA 108 (90 BASE) MCG/ACT inhaler  Generic drug:  albuterol  USE 2 PUFFS EVERY 6 HOURS AS NEEDED FOR WHEEZING.     clopidogrel 75 MG tablet  Commonly known as:  PLAVIX  TAKE 1 TABLET ONCE DAILY.     doxazosin 2 MG tablet  Commonly known as:  CARDURA  TAKE 3 TABLETS AT BEDTIME.     fenofibrate 160 MG tablet  TAKE 1 TABLET ONCE DAILY.     ferrous sulfate 325 (65 FE) MG EC tablet  Take 1 tablet (325 mg total) by mouth daily with breakfast.     furosemide 20 MG tablet  Commonly known as:  LASIX  TAKE 1 TABLET EACH DAY.     guaiFENesin 600 MG 12 hr tablet  Commonly known as:  MUCINEX  Take 600 mg by mouth 2 (two) times daily as needed for cough or to loosen phlegm.     LORazepam 1 MG tablet  Commonly known as:  ATIVAN  TAKE 1 TABLET EVERY 8 HOURS AS NEEDED FOR ANXIETY.     metoprolol succinate 50 MG 24 hr tablet  Commonly known as:  TOPROL-XL  TAKE 1 TABLET ONCE DAILY WITH FOOD.     montelukast 10 MG tablet  Commonly known as:  SINGULAIR  TAKE 1 TABLET ONCE DAILY.     ondansetron 4 MG  tablet  Commonly known as:  ZOFRAN  Take 1 tablet (4 mg total) by mouth every 8 (eight) hours as needed for nausea or vomiting.     pantoprazole 40 MG tablet  Commonly known as:  PROTONIX  TAKE 1 TABLET ONCE DAILY.     potassium chloride 10 MEQ tablet  Commonly known as:  K-DUR  Take 4 tablets (40 mEq total) by mouth daily. For one month     TRIBENZOR 40-10-25 MG Tabs  Generic drug:  Olmesartan-Amlodipine-HCTZ  TAKE 1 TABLET ONCE DAILY.     vitamin B-12 1000 MCG tablet  Commonly known as:  CYANOCOBALAMIN  Take 1,000 mcg by mouth daily.     VOLTAREN 1 % Gel  Generic drug:  diclofenac sodium  APPLY 4GM TO AFFECTED AREA(S) 4 TIMES A DAY.        SURGICAL HISTORY:  Past Surgical History  Procedure Laterality Date  . Abdominal hysterectomy      partial  . Breast surgery    . Carpal tunnel release Right 2000's  . Shoulder open rotator cuff repair Bilateral 2000's  . Carpal tunnel release Bilateral 10/21/2013    Procedure: BILATERAL  CARPAL TUNNEL RELEASE;  Surgeon: [name], MD;  Location:  [facility];  Service: Orthopedics;  Laterality: Bilateral;  . Trigger finger release Right 10/21/2013    Procedure: RELEASE TRIGGER FINGER/A-1 PULLEY RIGHT MIDDLE AND RIGHT RING;  Surgeon: [name], MD;  Location: [facility];  Service: Orthopedics;  Laterality: Right;    REVIEW OF SYSTEMS:   Constitutional: Denies fevers, chills or abnormal weight loss Eyes: Denies blurriness of vision Ears, nose, mouth, throat, and face: Denies mucositis or sore throat Respiratory: Denies cough, dyspnea or wheezes Cardiovascular: Denies palpitation, chest discomfort or lower extremity swelling Gastrointestinal:  Denies nausea, heartburn or change in bowel habits Skin: Denies abnormal skin rashes Lymphatics: Denies new lymphadenopathy or easy bruising Neurological:Denies numbness, tingling or new weaknesses Behavioral/Psych: Mood is stable, no new changes  All other systems were reviewed with the patient and are negative.  PHYSICAL EXAMINATION: ECOG PERFORMANCE STATUS: 1  Blood pressure 146/47, pulse 65, temperature 98.8 F (37.1 C), temperature source Oral, resp. rate 18, height 5' 1"  (1.549 m), weight 173 lb 6.4 oz (78.654 kg), SpO2 100 %.  GENERAL:alert, no distress and comfortable; elderly female who appears her stated age SKIN: skin color, texture, turgor are normal, no rashes or significant lesions EYES: normal, Conjunctiva are pink and non-injected, sclera clear OROPHARYNX:no exudate, no erythema and lips, buccal mucosa, and tongue normal  NECK: supple, thyroid normal size, non-tender, without nodularity LYMPH:  no palpable lymphadenopathy in the cervical, axillary or supraclavicular LUNGS: clear to auscultation with normal breathing effort HEART: regular rate & rhythm and no murmurs and 1+ lower extremity edema with chronic venous changes ABDOMEN:abdomen soft, non-tender and normal bowel sounds Musculoskeletal:no cyanosis of digits and no clubbing  NEURO:  alert & oriented x 3 with fluent speech, no focal motor/sensory deficits EXT: (+) b/l leg pitting edema up to knee    LABORATORY DATA:  Labs:  CBC Latest Ref Rng 11/23/2014 05/25/2014 04/27/2014  WBC 3.9 - 10.3 10e3/uL 3.2(L) 4.5 5.5  Hemoglobin 11.6 - 15.9 g/dL 9.9(L) 10.5(L) 10.4(L)  Hematocrit 34.8 - 46.6 % 29.4(L) 33.4(L) 32.7(L)  Platelets 145 - 400 10e3/uL 229 216 183.0    CMP Latest Ref Rng 11/02/2014 09/14/2014 06/11/2014  Glucose 70 - 99 mg/dL 116(H) 119(H) 146(H)  BUN 6 - 23 mg/dL 36(H) 28(H)  28(H)  Creatinine 0.40 - 1.20 mg/dL 1.45(H) 1.60(H) 1.61(H)  Sodium 135 - 145 mEq/L 135 137 132(L)  Potassium 3.5 - 5.1 mEq/L 4.4 3.8 4.1  Chloride 96 - 112 mEq/L 96 100 96  CO2 19 - 32 mEq/L 33(H) 29 28  Calcium 8.4 - 10.5 mg/dL 10.2 9.8 10.1  Total Protein 6.4 - 8.3 g/dL - - -  Total Bilirubin 0.20 - 1.20 mg/dL - - -  Alkaline Phos 40 - 150 U/L - - -  AST 5 - 34 U/L - - -  ALT 0 - 55 U/L - - -      ASSESSMENT: [name] 81 y.o. female with a history of Anemia from CKD and possibly some Myelodysplasia.   PLAN:  1. Anemia likely anemia of chronic disease/kidney disease.  --Her initial   iron study, ferritin,screening SPEP was within normal limits. Both her ESR and CRP was within normal limits. Her TSH screening was negative. Her folate level in April of this year was within normal limits. She is on vitamin B12 with her last level of 1620 in April. Her anemia is likely secondary to her moderate chronic renal disease. - Her hemoglobin is 9.9, she is slightly more symptomatic with fatigue, but still able to function very well. We discussed the benefit on the risks of Epo injection, especially the risk of thrombosis, including heart attack and stroke. She had a history of DVT in the past. I would consider starting Aranesp injection if her hemoglobin persistently below 10. -I have repeated her ferritin and iron level today, if low, I would consider IV Feraheme. She will continue oral  iron pill. -Follow-up CBC regularly.  2. Chronic Renal Disease, stage 3 -Stable with creatinine around 1.5-1.8. Patient has been counseled to avoid nephrotoxins and to continue adequate hydration. Patient should continue with tight glycemic control and maintain a good blood pressure to decrease the chance further decline in renal function.   3. GERD/dysphagia.  --Patient had an EGD and a colonoscopy on 03/19/2013 by Dr. Olevia Perches. With iron index and her ferritin within normal limits, this is less likely to be resulting in her mild anemia. Of note she has a first degree relative with a history of colon cancer. It is as noted in HPI.   4. History of DVT.  --Patient was informed that patients with a history of DVT or more prone to DVT recurrence. she denies any symptoms of swelling in the legs and she reports mobilization. She also denies any acute shortness of breath.   5. Hypertension  -Continue low salt diet and anti-hypertensive medications per PCP.   6. Follow-up  -Return to clinic in 3 months with repeated CBC and CMP -I'll call her with ferritin and iron study results, to see if she needs IV Feraheme  All questions were answered. The patient knows to call the clinic with any problems, questions or concerns. We can certainly see the patient much sooner if necessary.  I spent 15 minutes counseling the patient face to face. The total time spent in the appointment was 20 minutes.  Truitt Merle  11/23/2014

## 2014-11-23 NOTE — Telephone Encounter (Signed)
per pof to sch pt appt-gave pt copy of avs-sent Dr Burr Medico email to avs to put order in for mamma-will call & sch once reply and call [pt witha ppt time & date

## 2014-11-24 ENCOUNTER — Other Ambulatory Visit: Payer: Self-pay | Admitting: Internal Medicine

## 2014-11-24 NOTE — Telephone Encounter (Signed)
Sent to the pharmacy by e-scribe. 

## 2014-11-30 ENCOUNTER — Other Ambulatory Visit: Payer: Self-pay | Admitting: Internal Medicine

## 2014-11-30 NOTE — Telephone Encounter (Signed)
Sent to the pharmacy by e-scribe. 

## 2014-12-07 ENCOUNTER — Telehealth: Payer: Self-pay | Admitting: Hematology

## 2014-12-07 NOTE — Telephone Encounter (Signed)
I called her and left a message regarding her iron study from 7/11. Her iron level and ferritin are normal, no need for iv Feraheme, I recommend her to continue oral iron pill.  Truitt Merle  12/07/2014

## 2014-12-11 ENCOUNTER — Other Ambulatory Visit: Payer: Self-pay | Admitting: Internal Medicine

## 2014-12-11 NOTE — Telephone Encounter (Signed)
Sent to the pharmacy by e-scribe. 

## 2014-12-14 ENCOUNTER — Other Ambulatory Visit: Payer: Self-pay | Admitting: Family Medicine

## 2014-12-15 ENCOUNTER — Other Ambulatory Visit: Payer: Self-pay | Admitting: *Deleted

## 2014-12-15 MED ORDER — FERROUS SULFATE 325 (65 FE) MG PO TBEC: 325.0000 mg | DELAYED_RELEASE_TABLET | Freq: Every day | ORAL | Status: DC

## 2014-12-25 DIAGNOSIS — H04203 Unspecified epiphora, bilateral lacrimal glands: Secondary | ICD-10-CM | POA: Diagnosis not present

## 2014-12-25 DIAGNOSIS — H01003 Unspecified blepharitis right eye, unspecified eyelid: Secondary | ICD-10-CM | POA: Diagnosis not present

## 2014-12-29 ENCOUNTER — Other Ambulatory Visit: Payer: Self-pay | Admitting: Internal Medicine

## 2014-12-29 NOTE — Telephone Encounter (Signed)
Can refill x 1 

## 2014-12-30 NOTE — Telephone Encounter (Signed)
Called to the pharmacy and left on machine. 

## 2015-01-08 DIAGNOSIS — M5442 Lumbago with sciatica, left side: Secondary | ICD-10-CM | POA: Diagnosis not present

## 2015-01-11 ENCOUNTER — Encounter: Payer: Medicare Other | Attending: Internal Medicine | Admitting: Dietician

## 2015-01-11 ENCOUNTER — Encounter: Payer: Self-pay | Admitting: Dietician

## 2015-01-11 VITALS — Wt 179.0 lb

## 2015-01-11 DIAGNOSIS — Z6833 Body mass index (BMI) 33.0-33.9, adult: Secondary | ICD-10-CM | POA: Diagnosis not present

## 2015-01-11 DIAGNOSIS — E669 Obesity, unspecified: Secondary | ICD-10-CM

## 2015-01-11 DIAGNOSIS — Z713 Dietary counseling and surveillance: Secondary | ICD-10-CM | POA: Insufficient documentation

## 2015-01-11 DIAGNOSIS — E118 Type 2 diabetes mellitus with unspecified complications: Secondary | ICD-10-CM | POA: Insufficient documentation

## 2015-01-11 NOTE — Progress Notes (Signed)
Medical Nutrition Therapy:  Appt start time: [id number] end time:  1215.  Assessment:  07/10/14 Primary concerns today: Patient is here with her daughter.  She is newly diagnosed with Diabetes with HgbA1C of 6.8%.  Patient wants to know how to eat better to control her blood sugar.  Labs noted with BUN 28, Creat 1.61.  Hx also includes HTN.  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  Patient lives with her granddaughter.  Patient does her own shopping and cooking.  08/14/14: Patient is here with her daughter.  She has lost from 185 lbs to 176 lbs in one month.  She complains that she is hungry.  Patient has reduced her sugar soda and juice intake, biscuits less often, drinking more water and having more regular meals.  She is walking more frequently until her back and legs started to hurt.  Patient verbalized need to call about participating in the silver sneaker program.  01/11/15: Patient is here today with her daughter.  She states that she needs to get back on track.  She has had 2 family reunions this summer and reports increasing her biscuit intake.  Weight has increased to 179 lbs.  HgbA1C 09/14/14:  6.6%.  She has not been able to walk as much due to back pain.  MEDICATIONS: see list   DIETARY INTAKE: Patient states that she does not use salt.  Used to skip breakfast. Skips lunch at times. Rare dessert  24-hr recall:  B (9-10AM):  Cornflakes and milk Snk ( AM):   L (2PM): banana mayo or peanut butter sandwich  Snk ( PM): rare D ( PM): pintos, green beans, skinless chicken or pork chop, biscuit or cornbread Snk ( PM):  Beverages: water, coffee with creamer and splenda, occasional regular soda  Usual physical activity: walks at the [employer] 3 times per week but not recently because of the weather.  Likes to exercise outside.  Has silver sneaker membership but does not go.  Estimated energy needs: 1200 calories 135 g carbohydrates 75 g  protein 40 g fat  Progress Towards Goal(s):  In progress.   Nutritional Diagnosis:  NB-1.1 Food and nutrition-related knowledge deficit As related to balance of carbohydrate, protein, and fat.  As evidenced by diet hx..    Intervention:  Nutrition counseling and further diabetes education.  Patient reports that previous written materials have gotten lost in daughters moving.  She asked about the Glucerna shake.  Provided coupon and samples.  Wheat bread rather than biscuits more often. Bake, broil, or grill rather than fry. Get the leanest meat and remove the skin from the chicken. Be thoughtful about portion size. Aim for 2-3 Carbohydrate choices per meal Eat more non starchy vegetables. Walk for 30 minutes every day as able.  Try the silver sneaker program at the [facility].  OR armchair exercises. Remember to eat regularly.  Breakfast, lunch and dinner. Choose Gucerna or Boost Glucose Control as an option for a snack or breakfast if you are unable to eat regular food.   Breakfast ideas: 1/2 cup oatmeal, splenda, 1 slice toast with 1 tsp butter, boiled egg or peanut butter, 1 orange 1 slice of toast with 1 tsp butter, 1 egg made with onions and tomatoes, 1 piece of fruit  Lunch ideas:   1 cup Low sodium vegetable soup, 6 crackers, 1/2 [place] sandwich Banana sandwich (1/2 banana 1 T peanut butter, 2 slices wheat bread) [place] sandwich with  tomato on wheat bread with 1 tablespoon lite mayo, fruit 1/2 cup cottage cheese, 1 cup melon, 6 crackers  Dinner ideas: Skinless chicken or lean meat the size of a deck of card, 1 baked sweet potato with 1 tsp margarine, green beans. Pinto beans, brown rice, greens, fresh fruit  Teaching Method Utilized:  Visual Auditory  Handouts given during visit include:  My Plate  Meal plan card  Breakfast ideas  Barriers to learning/adherence to lifestyle change: ability to understand  Demonstrated degree of understanding via:  Teach Back    Monitoring/Evaluation:  Dietary intake, exercise, and body weight in 2 months.  Patient wishes a follow up to keep her on track.

## 2015-01-11 NOTE — Patient Instructions (Signed)
Wheat bread rather than biscuits more often. Bake, broil, or grill rather than fry. Get the leanest meat and remove the skin from the chicken. Be thoughtful about portion size. Aim for 2-3 Carbohydrate choices per meal Eat more non starchy vegetables. Walk for 30 minutes every day as able.  Try the silver sneaker program at the [facility].  OR armchair exercises. Remember to eat regularly.  Breakfast, lunch and dinner. Choose Gucerna or Boost Glucose Control as an option for a snack or breakfast if you are unable to eat regular food.

## 2015-01-31 ENCOUNTER — Other Ambulatory Visit: Payer: Self-pay | Admitting: Internal Medicine

## 2015-02-01 DIAGNOSIS — M545 Low back pain: Secondary | ICD-10-CM | POA: Diagnosis not present

## 2015-02-01 DIAGNOSIS — M47816 Spondylosis without myelopathy or radiculopathy, lumbar region: Secondary | ICD-10-CM | POA: Diagnosis not present

## 2015-02-12 ENCOUNTER — Other Ambulatory Visit: Payer: Self-pay | Admitting: Internal Medicine

## 2015-02-22 ENCOUNTER — Telehealth: Payer: Self-pay | Admitting: Hematology

## 2015-02-22 ENCOUNTER — Ambulatory Visit (HOSPITAL_BASED_OUTPATIENT_CLINIC_OR_DEPARTMENT_OTHER): Payer: Medicare Other | Admitting: Hematology

## 2015-02-22 ENCOUNTER — Encounter: Payer: Self-pay | Admitting: Hematology

## 2015-02-22 ENCOUNTER — Other Ambulatory Visit (HOSPITAL_BASED_OUTPATIENT_CLINIC_OR_DEPARTMENT_OTHER): Payer: Medicare Other

## 2015-02-22 VITALS — BP 143/55 | HR 64 | Temp 98.0°F | Resp 18 | Ht 61.0 in | Wt 175.9 lb

## 2015-02-22 DIAGNOSIS — D631 Anemia in chronic kidney disease: Secondary | ICD-10-CM | POA: Diagnosis not present

## 2015-02-22 DIAGNOSIS — Z86718 Personal history of other venous thrombosis and embolism: Secondary | ICD-10-CM | POA: Diagnosis not present

## 2015-02-22 DIAGNOSIS — D649 Anemia, unspecified: Secondary | ICD-10-CM

## 2015-02-22 DIAGNOSIS — D638 Anemia in other chronic diseases classified elsewhere: Secondary | ICD-10-CM

## 2015-02-22 DIAGNOSIS — N183 Chronic kidney disease, stage 3 (moderate): Secondary | ICD-10-CM | POA: Diagnosis not present

## 2015-02-22 LAB — COMPREHENSIVE METABOLIC PANEL (CC13)
ALT: 13 U/L (ref 0–55)
AST: 22 U/L (ref 5–34)
Albumin: 3.9 g/dL (ref 3.5–5.0)
Alkaline Phosphatase: 29 U/L — ABNORMAL LOW (ref 40–150)
Anion Gap: 9 mEq/L (ref 3–11)
BILIRUBIN TOTAL: 0.46 mg/dL (ref 0.20–1.20)
BUN: 25.4 mg/dL (ref 7.0–26.0)
CHLORIDE: 99 meq/L (ref 98–109)
CO2: 28 meq/L (ref 22–29)
Calcium: 9.7 mg/dL (ref 8.4–10.4)
Creatinine: 1.5 mg/dL — ABNORMAL HIGH (ref 0.6–1.1)
EGFR: 38 mL/min/{1.73_m2} — ABNORMAL LOW (ref >90)
GLUCOSE: 121 mg/dL (ref 70–140)
Potassium: 3.7 mEq/L (ref 3.5–5.1)
Sodium: 136 mEq/L (ref 136–145)
TOTAL PROTEIN: 6.8 g/dL (ref 6.4–8.3)

## 2015-02-22 LAB — IRON AND TIBC CHCC
%SAT: 26 % (ref 21–57)
Iron: 93 ug/dL (ref 41–142)
TIBC: 359 ug/dL (ref 236–444)
UIBC: 266 ug/dL (ref 120–384)

## 2015-02-22 LAB — CBC & DIFF AND RETIC
BASO%: 0.3 % (ref 0.0–2.0)
Basophils Absolute: 0 10e3/uL (ref 0.0–0.1)
EOS%: 2.2 % (ref 0.0–7.0)
Eosinophils Absolute: 0.1 10e3/uL (ref 0.0–0.5)
HCT: 32.2 % — ABNORMAL LOW (ref 34.8–46.6)
HGB: 10.5 g/dL — ABNORMAL LOW (ref 11.6–15.9)
Immature Retic Fract: 3 % (ref 1.60–10.00)
LYMPH%: 35.2 % (ref 14.0–49.7)
MCH: 28.3 pg (ref 25.1–34.0)
MCHC: 32.6 g/dL (ref 31.5–36.0)
MCV: 86.8 fL (ref 79.5–101.0)
MONO#: 0.3 10e3/uL (ref 0.1–0.9)
MONO%: 6.8 % (ref 0.0–14.0)
NEUT#: 2.1 10e3/uL (ref 1.5–6.5)
NEUT%: 55.5 % (ref 38.4–76.8)
Platelets: 197 10e3/uL (ref 145–400)
RBC: 3.71 10e6/uL (ref 3.70–5.45)
RDW: 13.1 % (ref 11.2–14.5)
Retic %: 1.53 % (ref 0.70–2.10)
Retic Ct Abs: 56.76 10e3/uL (ref 33.70–90.70)
WBC: 3.7 10e3/uL — ABNORMAL LOW (ref 3.9–10.3)
lymph#: 1.3 10e3/uL (ref 0.9–3.3)

## 2015-02-22 LAB — FERRITIN CHCC: Ferritin: 104 ng/ml (ref 9–269)

## 2015-02-22 NOTE — Telephone Encounter (Signed)
Gave patient avs report and appointments for April 2017.  °

## 2015-02-22 NOTE — Progress Notes (Signed)
[facility] HEMATOLOGY OFFICE PROGRESS NOTE Date of Visit: 02/16/2014  Angela Dawson, MD [place] [address]  DIAGNOSIS: Anemia of chronic disease  CHIEF COMPLAIN: follow up anemia   CURRENT THERAPY: Ferrous sulfate 325 mg daily with orange juice and B12 once daily .   INTERVAL HISTORY: [name] returns for follow up. She is accompanied by her daughter to the clinic today. She is clinically doing very well. Her main complaints still leg and hand cramps. Her mild fatigue is stable, she is able tolerate protein activity without much difficulty. She lives independently, still quite active. She denies any chest pain, dyspnea on exertion, or other new symptoms. She denies any sign of bleeding.   MEDICAL HISTORY:  Past Medical History  Diagnosis Date  . Asthma     " onset when young"   . History of DVT (deep vein thrombosis)     "RLE" (09/09/2012) coumadine cant take asa so on plavix   . Allergy   . GERD (gastroesophageal reflux disease)   . Hyperlipidemia   . Hypertension   . Varicose veins   . Lupus ([place])     "cured years ago" (09/09/2012)  . Headache(784.0)     "related to my high blood pressure" (09/09/2012)  . Arthritis     "shoulders" (09/09/2012)  . Anxiety   . Anemia   . Diabetes mellitus without complication ([place])     "Borderline" per pt; being monitored     ALLERGIES:  is allergic to penicillins and aspirin.  MEDICATIONS:   Medication List       This list is accurate as of: 02/22/15  4:41 PM.  Always use your most recent med list.               acetaminophen 500 MG tablet  Commonly known as:  TYLENOL  Take 1,000 mg by mouth every 6 (six) hours as needed for moderate pain.     ADVAIR DISKUS 250-50 MCG/DOSE Aepb  Generic drug:  Fluticasone-Salmeterol  USE 1 PUFF TWICE DAILY.     albuterol (2.5 MG/3ML) 0.083% nebulizer solution  Commonly known as:  PROVENTIL  USE 1 AMPULE EVERY 6 HOURS AS NEEDED FOR WHEEZE      PROAIR HFA 108 (90 BASE) MCG/ACT inhaler  Generic drug:  albuterol  USE 2 PUFFS EVERY 6 HOURS AS NEEDED FOR WHEEZING.     clopidogrel 75 MG tablet  Commonly known as:  PLAVIX  TAKE 1 TABLET ONCE DAILY.     diclofenac sodium 1 % Gel  Commonly known as:  VOLTAREN  APPLY 4GM TO AFFECTED AREA(S) 4 TIMES A DAY.     doxazosin 2 MG tablet  Commonly known as:  CARDURA  TAKE 3 TABLETS AT BEDTIME.     fenofibrate 160 MG tablet  TAKE 1 TABLET ONCE DAILY.     ferrous sulfate 325 (65 FE) MG EC tablet  Take 1 tablet (325 mg total) by mouth daily with breakfast.     furosemide 20 MG tablet  Commonly known as:  LASIX  TAKE 1 TABLET EACH DAY.     guaiFENesin 600 MG 12 hr tablet  Commonly known as:  MUCINEX  Take 600 mg by mouth 2 (two) times daily as needed for cough or to loosen phlegm.     HYDROcodone-acetaminophen 5-325 MG tablet  Commonly known as:  NORCO/VICODIN     LORazepam 1 MG tablet  Commonly known as:  ATIVAN  TAKE 1 TABLET EVERY 8 HOURS  AS NEEDED FOR ANXIETY.     metoprolol succinate 50 MG 24 hr tablet  Commonly known as:  TOPROL-XL  TAKE 1 TABLET ONCE DAILY WITH FOOD.     montelukast 10 MG tablet  Commonly known as:  SINGULAIR  TAKE 1 TABLET ONCE DAILY.     ondansetron 4 MG tablet  Commonly known as:  ZOFRAN  Take 1 tablet (4 mg total) by mouth every 8 (eight) hours as needed for nausea or vomiting.     pantoprazole 40 MG tablet  Commonly known as:  PROTONIX  TAKE 1 TABLET ONCE DAILY.     PAZEO 0.7 % Soln  Generic drug:  Olopatadine HCl     potassium chloride 10 MEQ tablet  Commonly known as:  K-DUR  TAKE 4 TABLETS DAILY.     RESTASIS 0.05 % ophthalmic emulsion  Generic drug:  cycloSPORINE     TRIBENZOR 40-10-25 MG Tabs  Generic drug:  Olmesartan-Amlodipine-HCTZ  TAKE 1 TABLET ONCE DAILY.     vitamin B-12 1000 MCG tablet  Commonly known as:  CYANOCOBALAMIN  Take 1,000 mcg by mouth daily.        SURGICAL HISTORY:  Past Surgical History   Procedure Laterality Date  . Abdominal hysterectomy      partial  . Breast surgery    . Carpal tunnel release Right 2000's  . Shoulder open rotator cuff repair Bilateral 2000's  . Carpal tunnel release Bilateral 10/21/2013    Procedure: BILATERAL  CARPAL TUNNEL RELEASE;  Surgeon: [name], MD;  Location: [facility];  Service: Orthopedics;  Laterality: Bilateral;  . Trigger finger release Right 10/21/2013    Procedure: RELEASE TRIGGER FINGER/A-1 PULLEY RIGHT MIDDLE AND RIGHT RING;  Surgeon: [name], MD;  Location: [facility];  Service: Orthopedics;  Laterality: Right;    REVIEW OF SYSTEMS:   Constitutional: Denies fevers, chills or abnormal weight loss Eyes: Denies blurriness of vision Ears, nose, mouth, throat, and face: Denies mucositis or sore throat Respiratory: Denies cough, dyspnea or wheezes Cardiovascular: Denies palpitation, chest discomfort or lower extremity swelling Gastrointestinal:  Denies nausea, heartburn or change in bowel habits Skin: Denies abnormal skin rashes Lymphatics: Denies new lymphadenopathy or easy bruising Neurological:Denies numbness, tingling or new weaknesses Behavioral/Psych: Mood is stable, no new changes  All other systems were reviewed with the patient and are negative.  PHYSICAL EXAMINATION: ECOG PERFORMANCE STATUS: 1  Blood pressure 143/55, pulse 64, temperature 98 F (36.7 C), temperature source Oral, resp. rate 18, height 5' 1"  (1.549 m), weight 175 lb 14.4 oz (79.788 kg), SpO2 100 %.  GENERAL:alert, no distress and comfortable; elderly female who appears her stated age SKIN: skin color, texture, turgor are normal, no rashes or significant lesions EYES: normal, Conjunctiva are pink and non-injected, sclera clear OROPHARYNX:no exudate, no erythema and lips, buccal mucosa, and tongue normal  NECK: supple, thyroid normal size, non-tender, without nodularity LYMPH:  no palpable lymphadenopathy in the cervical,  axillary or supraclavicular LUNGS: clear to auscultation with normal breathing effort HEART: regular rate & rhythm and no murmurs and 1+ lower extremity edema with chronic venous changes ABDOMEN:abdomen soft, non-tender and normal bowel sounds Musculoskeletal:no cyanosis of digits and no clubbing  NEURO: alert & oriented x 3 with fluent speech, no focal motor/sensory deficits EXT: (+) b/l leg pitting edema up to knee    LABORATORY DATA:  Labs:  CBC Latest Ref Rng 02/22/2015 11/23/2014 05/25/2014  WBC 3.9 - 10.3 10e3/uL 3.7(L) 3.2(L) 4.5  Hemoglobin  11.6 - 15.9 g/dL 10.5(L) 9.9(L) 10.5(L)  Hematocrit 34.8 - 46.6 % 32.2(L) 29.4(L) 33.4(L)  Platelets 145 - 400 10e3/uL 197 229 216    CMP Latest Ref Rng 02/22/2015 11/23/2014 11/02/2014  Glucose 70 - 140 mg/dl 121 123 116(H)  BUN 7.0 - 26.0 mg/dL 25.4 29.4(H) 36(H)  Creatinine 0.6 - 1.1 mg/dL 1.5(H) 1.4(H) 1.45(H)  Sodium 136 - 145 mEq/L 136 131(L) 135  Potassium 3.5 - 5.1 mEq/L 3.7 4.2 4.4  Chloride 96 - 112 mEq/L - - 96  CO2 22 - 29 mEq/L 28 28 33(H)  Calcium 8.4 - 10.4 mg/dL 9.7 9.4 10.2  Total Protein 6.4 - 8.3 g/dL 6.8 6.2(L) -  Total Bilirubin 0.20 - 1.20 mg/dL 0.46 0.32 -  Alkaline Phos 40 - 150 U/L 29(L) 35(L) -  AST 5 - 34 U/L 22 23 -  ALT 0 - 55 U/L 13 14 -   Results for [name], [name] (MRN [id number]) as of 02/22/2015 16:44  Ref. Range 11/23/2014 09:20 02/22/2015 10:13  Iron Latest Ref Range: 41-142 ug/dL 60 93  UIBC Latest Ref Range: 120-384 ug/dL 265 266  TIBC Latest Ref Range: 236-444 ug/dL 325 359  %SAT Latest Ref Range: 21-57 % 18 (L) 26  Ferritin Latest Ref Range: 9-269 ng/ml 112 104     ASSESSMENT: [name] 81 y.o. female with a history of Anemia from CKD and possibly some Myelodysplasia.   PLAN:  1. Anemia likely anemia of chronic disease/kidney disease.  --Her initial   iron study, ferritin,screening SPEP was within normal limits. Both her ESR and CRP was within normal limits. Her TSH screening was  negative. Her folate level in April of this year was within normal limits. She is on vitamin B12 with her last level of 1620 in April. Her anemia is likely secondary to her moderate chronic renal disease. - Her hemoglobin is 10.5 today. she is not symptomatic, and functions well.. We discussed the benefit on the risks of Epo injection, especially the risk of thrombosis, including heart attack and stroke. She had a history of DVT in the past. I would consider starting Aranesp injection if her hemoglobin persistently below 10. -Her repeated ferritin and iron level today are adequate. She will continue oral iron pill. -Follow-up CBC. Given her anemia has been very stable over the past few years, I'll see her back every 6 months.  2. Chronic Renal Disease, stage 3 -Stable with creatinine around 1.5-1.8. Patient has been counseled to avoid nephrotoxins and to continue adequate hydration. Patient should continue with tight glycemic control and maintain a good blood pressure to decrease the chance further decline in renal function.   3. GERD/dysphagia.  --Patient had an EGD and a colonoscopy on 03/19/2013 by Dr. Olevia Perches. With iron index and her ferritin within normal limits, this is less likely to be resulting in her mild anemia.   4. History of DVT.  --Patient was informed that patients with a history of DVT or more prone to DVT recurrence. she denies any symptoms of swelling in the legs and she reports mobilization. She also denies any acute shortness of breath.   5. Hypertension  -Continue low salt diet and anti-hypertensive medications per PCP.   6. Follow-up  -Return to clinic in 6 months with repeated CBC and CMP   All questions were answered. The patient knows to call the clinic with any problems, questions or concerns. We can certainly see the patient much sooner if necessary.  I spent 15 minutes  counseling the patient face to face. The total time spent in the appointment was 20  minutes.  Truitt Merle  02/22/2015

## 2015-02-23 ENCOUNTER — Telehealth: Payer: Self-pay | Admitting: Internal Medicine

## 2015-02-23 NOTE — Telephone Encounter (Signed)
Ok refill x 1  #24

## 2015-02-23 NOTE — Telephone Encounter (Signed)
[name], [address]  Requesting refill of LORazepam (ATIVAN) 1 MG tablet

## 2015-02-24 MED ORDER — LORAZEPAM 1 MG PO TABS: ORAL_TABLET | ORAL | Status: DC

## 2015-02-24 NOTE — Telephone Encounter (Signed)
Called to the pharmacy and left on machine. 

## 2015-03-01 ENCOUNTER — Ambulatory Visit (INDEPENDENT_AMBULATORY_CARE_PROVIDER_SITE_OTHER): Payer: Medicare Other

## 2015-03-01 ENCOUNTER — Other Ambulatory Visit: Payer: Self-pay | Admitting: Internal Medicine

## 2015-03-01 ENCOUNTER — Other Ambulatory Visit (INDEPENDENT_AMBULATORY_CARE_PROVIDER_SITE_OTHER): Payer: Medicare Other

## 2015-03-01 DIAGNOSIS — N289 Disorder of kidney and ureter, unspecified: Secondary | ICD-10-CM

## 2015-03-01 DIAGNOSIS — R7303 Prediabetes: Secondary | ICD-10-CM

## 2015-03-01 DIAGNOSIS — Z23 Encounter for immunization: Secondary | ICD-10-CM | POA: Diagnosis not present

## 2015-03-01 LAB — BASIC METABOLIC PANEL
BUN: 25 mg/dL — AB (ref 6–23)
CALCIUM: 9.7 mg/dL (ref 8.4–10.5)
CO2: 30 meq/L (ref 19–32)
CREATININE: 1.42 mg/dL — AB (ref 0.40–1.20)
Chloride: 95 mEq/L — ABNORMAL LOW (ref 96–112)
GFR: 45.88 mL/min — ABNORMAL LOW (ref >60.00)
GLUCOSE: 112 mg/dL — AB (ref 70–99)
Potassium: 3.9 mEq/L (ref 3.5–5.1)
Sodium: 134 mEq/L — ABNORMAL LOW (ref 135–145)

## 2015-03-01 LAB — HEMOGLOBIN A1C: Hgb A1c MFr Bld: 5.9 % (ref 4.6–6.5)

## 2015-03-02 NOTE — Telephone Encounter (Signed)
Ok x 2 sent in

## 2015-03-08 ENCOUNTER — Ambulatory Visit (INDEPENDENT_AMBULATORY_CARE_PROVIDER_SITE_OTHER): Payer: Medicare Other | Admitting: Internal Medicine

## 2015-03-08 ENCOUNTER — Encounter: Payer: Self-pay | Admitting: Internal Medicine

## 2015-03-08 VITALS — BP 140/54 | HR 67 | Temp 97.9°F | Ht 61.0 in | Wt 178.3 lb

## 2015-03-08 DIAGNOSIS — Z79899 Other long term (current) drug therapy: Secondary | ICD-10-CM

## 2015-03-08 DIAGNOSIS — D638 Anemia in other chronic diseases classified elsewhere: Secondary | ICD-10-CM

## 2015-03-08 DIAGNOSIS — I10 Essential (primary) hypertension: Secondary | ICD-10-CM | POA: Diagnosis not present

## 2015-03-08 DIAGNOSIS — J45901 Unspecified asthma with (acute) exacerbation: Secondary | ICD-10-CM | POA: Diagnosis not present

## 2015-03-08 DIAGNOSIS — N289 Disorder of kidney and ureter, unspecified: Secondary | ICD-10-CM

## 2015-03-08 DIAGNOSIS — R7301 Impaired fasting glucose: Secondary | ICD-10-CM

## 2015-03-08 MED ORDER — AZITHROMYCIN 250 MG PO TABS: ORAL_TABLET | ORAL | Status: DC

## 2015-03-08 MED ORDER — PREDNISONE 20 MG PO TABS
ORAL_TABLET | ORAL | Status: DC
Start: 2015-03-08 — End: 2015-04-30

## 2015-03-08 MED ORDER — DOXAZOSIN MESYLATE 2 MG PO TABS: ORAL_TABLET | ORAL | Status: DC

## 2015-03-08 NOTE — Progress Notes (Signed)
Chief Complaint  Patient presents with  . Follow-up    HPI: [name] 81 y.o. comes in for Chronic disease management  Here with family member   BP   150 /70  Hematologist  Needs refill med  Has check cold  On inhalers  hoarse  Some thick yellow phlegm recently  No fever  Ongoing sine a week  .   No other meds   Had back check  Dr Marlou Sa  Medication  rx at this time mod Lumbar stenosis and  Facet arthritis   Using lorazepam about 2 x per week .  Has been to nutritionist about he elevatetd bg   ROS: See pertinent positives and negatives per HPI. See a boveno syncope hemoptysis   Past Medical History  Diagnosis Date  . Asthma     " onset when young"   . History of DVT (deep vein thrombosis)     "RLE" (09/09/2012) coumadine cant take asa so on plavix   . Allergy   . GERD (gastroesophageal reflux disease)   . Hyperlipidemia   . Hypertension   . Varicose veins   . Lupus ([place])     "cured years ago" (09/09/2012)  . Headache(784.0)     "related to my high blood pressure" (09/09/2012)  . Arthritis     "shoulders" (09/09/2012)  . Anxiety   . Anemia   . Diabetes mellitus without complication ([place])     "Borderline" per pt; being monitored    Family History  Problem Relation Age of Onset  . Hypertension Mother   . Hypertension Father   . Cancer Sister     Colon and Breast Cancer  . Hypertension Brother   . Heart attack Daughter   . Lung cancer      both parents     Social History   Social History  . Marital Status: Single    Spouse Name: N/A  . Number of Children: N/A  . Years of Education: N/A   Social History Main Topics  . Smoking status: Never Smoker   . Smokeless tobacco: Never Used  . Alcohol Use: No  . Drug Use: No  . Sexual Activity: Not Asked   Other Topics Concern  . None   Social History Narrative   2 people living in the home.  Grand daughter.   Up and down through the night      Had 7 children  2 deceased . 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    works 3 days per week currently .   Glasses dentures  Neg tad     Outpatient Prescriptions Prior to Visit  Medication Sig Dispense Refill  . acetaminophen (TYLENOL) 500 MG tablet Take 1,000 mg by mouth every 6 (six) hours as needed for moderate pain.    [name] ADVAIR DISKUS 250-50 MCG/DOSE AEPB USE 1 PUFF TWICE DAILY. 60 each 5  . albuterol (PROVENTIL) (2.5 MG/3ML) 0.083% nebulizer solution USE 1 AMPULE EVERY 6 HOURS AS NEEDED FOR WHEEZE 75 mL 1  . clopidogrel (PLAVIX) 75 MG tablet TAKE 1 TABLET ONCE DAILY. 30 tablet 4  . diclofenac sodium (VOLTAREN) 1 % GEL APPLY 4GM TO AFFECTED AREA(S) 4 TIMES A DAY. 100 g 0  . doxazosin (CARDURA) 2 MG tablet TAKE 3 TABLETS AT BEDTIME. 90 tablet 2  . fenofibrate 160 MG tablet TAKE 1 TABLET ONCE DAILY. 90 tablet  0  . ferrous sulfate 325 (65 FE) MG EC tablet Take 1 tablet (325 mg total) by mouth daily with breakfast. 30 tablet 3  . furosemide (LASIX) 20 MG tablet TAKE 1 TABLET EACH DAY. 30 tablet 3  . guaiFENesin (MUCINEX) 600 MG 12 hr tablet Take 600 mg by mouth 2 (two) times daily as needed for cough or to loosen phlegm.    [name] HYDROcodone-acetaminophen (NORCO/VICODIN) 5-325 MG tablet     . LORazepam (ATIVAN) 1 MG tablet TAKE 1 TABLET EVERY 8 HOURS AS NEEDED FOR ANXIETY. 24 tablet 0  . metoprolol succinate (TOPROL-XL) 50 MG 24 hr tablet TAKE 1 TABLET ONCE DAILY WITH FOOD. 30 tablet 4  . montelukast (SINGULAIR) 10 MG tablet TAKE 1 TABLET ONCE DAILY. 90 tablet 1  . ondansetron (ZOFRAN) 4 MG tablet Take 1 tablet (4 mg total) by mouth every 8 (eight) hours as needed for nausea or vomiting. 12 tablet 0  . pantoprazole (PROTONIX) 40 MG tablet TAKE 1 TABLET ONCE DAILY. 30 tablet 5  . PAZEO 0.7 % SOLN     . potassium chloride (K-DUR) 10 MEQ tablet TAKE 4 TABLETS DAILY. 120 tablet 3  . PROAIR HFA 108 (90 BASE) MCG/ACT inhaler USE 2 PUFFS EVERY 6 HOURS AS NEEDED FOR WHEEZING. 8.5  g 1  . RESTASIS 0.05 % ophthalmic emulsion     . TRIBENZOR 40-10-25 MG TABS TAKE 1 TABLET ONCE DAILY. 30 tablet 5  . vitamin B-12 (CYANOCOBALAMIN) 1000 MCG tablet Take 1,000 mcg by mouth daily.     No facility-administered medications prior to visit.     EXAM:  BP 140/54 mmHg  Pulse 67  Temp(Src) 97.9 F (36.6 C) (Oral)  Ht 5\' 1"  (1.549 m)  Wt 178 lb 4.8 oz (80.876 kg)  BMI 33.71 kg/m2  SpO2 98%  Body mass index is 33.71 kg/(m^2).  GENERAL: vitals reviewed and listed above, alert, oriented, appears well hydrated and in no acute distress hoarse with runny nose and cough   HEENT: atraumatic, conjunctiva  clear, no obvious abnormalities on inspection of external nose and ears tms nl nares congstionOP : no lesion edema or exudate  NECK: no obvious masses on inspection palpation  LUNGS:  De bs some wheezing   No retractions  CV: HRRR, no clubbing cyanosis or   nl cap refill  MS: moves all extremities without noticeable focal  abnormality PSYCH: pleasant and cooperative, no obvious depression or anxiety Lab Results  Component Value Date   WBC 3.7* 02/22/2015   HGB 10.5* 02/22/2015   HCT 32.2* 02/22/2015   PLT 197 02/22/2015   GLUCOSE 112* 03/01/2015   CHOL 133 09/14/2014   TRIG 84.0 09/14/2014   HDL 46.80 09/14/2014   LDLCALC 69 09/14/2014   ALT 13 02/22/2015   AST 22 02/22/2015   NA 134* 03/01/2015   K 3.9 03/01/2015   CL 95* 03/01/2015   CREATININE 1.42* 03/01/2015   BUN 25* 03/01/2015   CO2 30 03/01/2015   TSH 1.99 04/27/2014   INR 0.94 12/22/2009   HGBA1C 5.9 03/01/2015   BP Readings from Last 3 Encounters:  03/08/15 140/54  02/22/15 143/55  11/23/14 146/47    ASSESSMENT AND PLAN:  Discussed the following assessment and plan:  Asthmatic bronchitis with acute exacerbation - posss seondary abact infection  empiric rx cont inhalers  expectant managment  Renal insufficiency creatinine 1.3  Essential hypertension - accetable control at present considering   prev  uncontrolled .    Anemia of chronic disease  Fasting hyperglycemia - better controlled a1c 5.9  Medication management  Asthma, chronic, unspecified asthma severity, with acute exacerbation  Risk benefit of medication discussed. Pat aware of pred effects  -Patient advised to return or notify health care team  if symptoms worsen ,persist or new concerns arise.  Patient Instructions  Take antibiotic and prednisone    Fu i f    persistent or progressive   BP acceptable readings .     [name] in 6 months or as needed      Standley Brooking. Panosh M.D.

## 2015-03-08 NOTE — Patient Instructions (Addendum)
Take antibiotic and prednisone    Fu i f    persistent or progressive   BP acceptable readings .     [name] in 6 months or as needed

## 2015-03-08 NOTE — Addendum Note (Signed)
Addended by: Miles Costain T on: 03/08/2015 05:15 PM   Modules accepted: Orders

## 2015-03-09 ENCOUNTER — Other Ambulatory Visit: Payer: Self-pay | Admitting: Internal Medicine

## 2015-03-15 ENCOUNTER — Ambulatory Visit: Payer: Self-pay | Admitting: Dietician

## 2015-03-29 DIAGNOSIS — H01003 Unspecified blepharitis right eye, unspecified eyelid: Secondary | ICD-10-CM | POA: Diagnosis not present

## 2015-03-29 DIAGNOSIS — H04123 Dry eye syndrome of bilateral lacrimal glands: Secondary | ICD-10-CM | POA: Diagnosis not present

## 2015-03-29 DIAGNOSIS — H04203 Unspecified epiphora, bilateral lacrimal glands: Secondary | ICD-10-CM | POA: Diagnosis not present

## 2015-03-30 ENCOUNTER — Other Ambulatory Visit: Payer: Self-pay | Admitting: Internal Medicine

## 2015-04-01 ENCOUNTER — Other Ambulatory Visit: Payer: Self-pay | Admitting: Internal Medicine

## 2015-04-01 NOTE — Telephone Encounter (Signed)
Sent to the pharmacy by e-scribe.  Has upcoming appt 08/2015

## 2015-04-02 NOTE — Telephone Encounter (Signed)
Sent to the pharmacy by e-scribe. 

## 2015-04-06 ENCOUNTER — Other Ambulatory Visit: Payer: Self-pay | Admitting: Internal Medicine

## 2015-04-07 ENCOUNTER — Other Ambulatory Visit: Payer: Self-pay | Admitting: Internal Medicine

## 2015-04-07 ENCOUNTER — Telehealth: Payer: Self-pay | Admitting: Internal Medicine

## 2015-04-07 NOTE — Telephone Encounter (Signed)
Pt request refill of the following: LORazepam (ATIVAN) 1 MG tablet   albuterol (PROVENTIL) (2.5 MG/3ML) 0.083% nebulizer solution   Pt said pharmacy told her they did not received this rx       Phamacy:  [facility]

## 2015-04-07 NOTE — Telephone Encounter (Signed)
Duplicate Request.

## 2015-04-11 NOTE — Telephone Encounter (Signed)
Ok refill each x 1

## 2015-04-12 ENCOUNTER — Other Ambulatory Visit: Payer: Self-pay | Admitting: Internal Medicine

## 2015-04-12 MED ORDER — ALBUTEROL SULFATE HFA 108 (90 BASE) MCG/ACT IN AERS: INHALATION_SPRAY | RESPIRATORY_TRACT | Status: DC

## 2015-04-12 NOTE — Telephone Encounter (Signed)
Albuterol sent to the pharmacy by e-scribe.  Lorazepam called and left on machine.

## 2015-04-14 NOTE — Telephone Encounter (Signed)
Sent to the pharmacy by e-scribe. 

## 2015-04-23 ENCOUNTER — Telehealth: Payer: Self-pay | Admitting: Internal Medicine

## 2015-04-23 NOTE — Telephone Encounter (Signed)
Called the pt and transferred her to triage.

## 2015-04-23 NOTE — Telephone Encounter (Signed)
Pt to go to ED.  See team health note.

## 2015-04-23 NOTE — Telephone Encounter (Signed)
Spoke to the pt.  She stated the call was disconnected.  Transferred again to [facility].

## 2015-04-23 NOTE — Telephone Encounter (Signed)
[name] called concerned about the pain she's experiencing in her left leg. In addition to this her left foot is swollen. She's wondering if she can be worked in on Monday. Please call the pt.   Pt's ph# [phone] Thank you.

## 2015-04-23 NOTE — Telephone Encounter (Signed)
[facility] - Client [place] [facility]  Patient Name: [name]  DOB: 05/29/1933    Initial Comment Caller states left leg is hurting and weak. Swollen. No numbness   Nurse Assessment  Nurse: Wynetta Emery, RN, Baker Janus Date/Time Eilene Ghazi Time): 04/23/2015 4:31:05 PM  Confirm and document reason for call. If symptomatic, describe symptoms. ---[name] is having problems with left leg swollen from knee to ankle and it is hurting and so is the right leg. leg is worse wants appt  Has the patient traveled out of the country within the last 30 days? ---No  Does the patient have any new or worsening symptoms? ---Yes  Will a triage be completed? ---Yes  Related visit to physician within the last 2 weeks? ---No  Does the PT have any chronic conditions? (i.e. diabetes, asthma, etc.) ---No  Is this a behavioral health or substance abuse call? ---No     Guidelines    Guideline Title Affirmed Question Affirmed Notes  Leg Swelling and Edema [1] Thigh or calf pain AND [2] only 1 side AND [3] present > 1 hour    Final Disposition User   See Physician within 4 Hours (or PCP triage) Wynetta Emery, RN, Baker Janus    Comments  NOTE; Nurse advised her to call her daughter to take her to ED -- once there tell them that we are sending her in for pain in lower leg with swelling   Referrals  [facility] - ED   Disagree/Comply: Comply

## 2015-04-23 NOTE — Telephone Encounter (Signed)
Noted  i agree with plan advice

## 2015-04-26 NOTE — Telephone Encounter (Signed)
Pt did not visit one of our hospitals.  Tried to reach her to see if she has received care.  Left a message on home number for a return call.

## 2015-04-27 NOTE — Telephone Encounter (Signed)
Patient did not go to ED but she made an appt for Fri 12/16 at 10:00 w/Dr. Yong Channel.

## 2015-04-27 NOTE — Telephone Encounter (Signed)
Left a message for a return call.

## 2015-04-27 NOTE — Telephone Encounter (Signed)
Discussed with WP.  Will attempt to reach the pt to find out why she did not go to the ED and get more clarification on the problem.

## 2015-04-27 NOTE — Telephone Encounter (Signed)
Not clear why I am being involved here. PCP is Dr. Regis Bill.

## 2015-04-27 NOTE — Telephone Encounter (Signed)
Pt has appt with you Friday.  Dr. Regis Bill is out of the office for the rest of the week starting tomorrow.

## 2015-04-27 NOTE — Telephone Encounter (Signed)
[name] is to discuss this with Dr. Regis Bill before she leaves today. I am happy to see patient on Friday but I have no reference point before this visit to manage patient.

## 2015-04-27 NOTE — Telephone Encounter (Signed)
Will inform Dr. Yong Channel that pt did not go to ED as directed.  Pt has hx of leg pain.

## 2015-04-28 NOTE — Telephone Encounter (Signed)
Left a message for a return call.

## 2015-04-30 ENCOUNTER — Other Ambulatory Visit: Payer: Self-pay | Admitting: Internal Medicine

## 2015-04-30 ENCOUNTER — Ambulatory Visit (HOSPITAL_COMMUNITY)
Admission: RE | Admit: 2015-04-30 | Discharge: 2015-04-30 | Disposition: A | Discharge status: Home / Self Care | Payer: Medicare Other | Source: Ambulatory Visit | Attending: Family Medicine | Admitting: Family Medicine

## 2015-04-30 ENCOUNTER — Encounter: Payer: Self-pay | Admitting: Family Medicine

## 2015-04-30 ENCOUNTER — Ambulatory Visit (INDEPENDENT_AMBULATORY_CARE_PROVIDER_SITE_OTHER): Payer: Medicare Other | Admitting: Family Medicine

## 2015-04-30 VITALS — BP 150/64 | HR 74 | Temp 98.6°F | Wt 183.0 lb

## 2015-04-30 DIAGNOSIS — M79662 Pain in left lower leg: Secondary | ICD-10-CM

## 2015-04-30 DIAGNOSIS — Z86718 Personal history of other venous thrombosis and embolism: Secondary | ICD-10-CM

## 2015-04-30 DIAGNOSIS — I5033 Acute on chronic diastolic (congestive) heart failure: Secondary | ICD-10-CM

## 2015-04-30 DIAGNOSIS — I10 Essential (primary) hypertension: Secondary | ICD-10-CM | POA: Insufficient documentation

## 2015-04-30 DIAGNOSIS — M79661 Pain in right lower leg: Secondary | ICD-10-CM

## 2015-04-30 DIAGNOSIS — E785 Hyperlipidemia, unspecified: Secondary | ICD-10-CM | POA: Insufficient documentation

## 2015-04-30 DIAGNOSIS — E119 Type 2 diabetes mellitus without complications: Secondary | ICD-10-CM | POA: Insufficient documentation

## 2015-04-30 NOTE — Progress Notes (Signed)
Garret Reddish, MD  Subjective:  [name] is a 81 y.o. year old very pleasant female patient who presents for/with See problem oriented charting ROS- She always sleeps on 4 pillows with no recent increase. No acute shortness of breath. No chest pain.   Past Medical History-  Patient Active Problem List   Diagnosis Date Noted  . Acute respiratory infection 07/13/2014  . Perceived hearing changes 06/29/2014  . Vertigo 06/29/2014  . Resistant hypertension 06/29/2014  . Lumbago 06/15/2014  . Pre-diabetes vs early DM  06/15/2014  . Dyspnea 04/27/2014  . Asthma, chronic 04/13/2014  . Elevated uric acid in blood 04/13/2014  . Acute superficial venous thrombosis of left lower extremity 03/27/2014  . Essential hypertension 03/27/2014  . History of DVT (deep vein thrombosis)   . Palpitations 01/05/2014  . Ankle pain 01/05/2014  . Anemia of chronic disease 01-11-14  . Death of family member 09/15/2013  . Leg edema 06/20/2013  . Bereavement due to life event 04/21/2013  . Other dysphagia 02/11/2013  . Colon cancer screening 02/11/2013  . Headache(784.0) 01/20/2013  . Anxiety state 01/20/2013  . Leg cramps 01/20/2013  . Back pain 12/05/2012  . Family hx of colon cancer 10/21/2012  . Family hx of lung cancer 10/21/2012  . Family hx-breast malignancy 10/21/2012  . Renal insufficiency creatinine 1.3 10/21/2012  . Anemia 10/21/2012  . Chest pain, atypical 09/08/2012  . GERD (gastroesophageal reflux disease) 09/08/2012  . Depression 09/08/2012  . HTN (hypertension) 09/08/2012  . Hyperlipidemia 09/08/2012  . Asthma 09/08/2012  . Varicose veins of leg with complications [id number]    Medications- reviewed and updated Current Outpatient Prescriptions  Medication Sig Dispense Refill  . clopidogrel (PLAVIX) 75 MG tablet TAKE 1 TABLET ONCE DAILY. 30 tablet 5  . diclofenac sodium (VOLTAREN) 1 % GEL APPLY 4GM TO AFFECTED AREA(S) 4 TIMES A DAY. 100 g 0  . doxazosin (CARDURA) 2 MG  tablet TAKE 3 TABLETS AT BEDTIME. 90 tablet 5  . fenofibrate 160 MG tablet TAKE 1 TABLET ONCE DAILY. 90 tablet 0  . ferrous sulfate 325 (65 FE) MG EC tablet Take 1 tablet (325 mg total) by mouth daily with breakfast. 30 tablet 3  . furosemide (LASIX) 20 MG tablet TAKE 1 TABLET ONCE DAILY. 30 tablet 5  . metoprolol succinate (TOPROL-XL) 50 MG 24 hr tablet TAKE 1 TABLET ONCE DAILY WITH FOOD. 30 tablet 4  . montelukast (SINGULAIR) 10 MG tablet TAKE 1 TABLET ONCE DAILY. 90 tablet 1  . pantoprazole (PROTONIX) 40 MG tablet TAKE 1 TABLET ONCE DAILY. 30 tablet 5  . PAZEO 0.7 % SOLN     . potassium chloride (K-DUR) 10 MEQ tablet TAKE 4 TABLETS DAILY. 120 tablet 5  . RESTASIS 0.05 % ophthalmic emulsion     . TRIBENZOR 40-10-25 MG TABS TAKE 1 TABLET ONCE DAILY. 30 tablet 5  . vitamin B-12 (CYANOCOBALAMIN) 1000 MCG tablet Take 1,000 mcg by mouth daily.    [name] acetaminophen (TYLENOL) 500 MG tablet Take 1,000 mg by mouth every 6 (six) hours as needed for moderate pain. Reported on 04/30/2015    . ADVAIR DISKUS 250-50 MCG/DOSE AEPB USE 1 PUFF TWICE DAILY. (Patient not taking: Reported on 04/30/2015) 60 each 5  . albuterol (PROAIR HFA) 108 (90 BASE) MCG/ACT inhaler USE 2 PUFFS EVERY 6 HOURS AS NEEDED FOR WHEEZING. (Patient not taking: Reported on 04/30/2015) 8.5 g 0  . albuterol (PROVENTIL) (2.5 MG/3ML) 0.083% nebulizer solution USE 1 AMPULE EVERY 6 HOURS AS NEEDED FOR  WHEEZE (Patient not taking: Reported on 04/30/2015) 75 mL 1  . guaiFENesin (MUCINEX) 600 MG 12 hr tablet Take 600 mg by mouth 2 (two) times daily as needed for cough or to loosen phlegm. Reported on 04/30/2015    . HYDROcodone-acetaminophen (NORCO/VICODIN) 5-325 MG tablet Reported on 04/30/2015    . LORazepam (ATIVAN) 1 MG tablet TAKE 1 TABLET EVERY 8 HOURS AS NEEDED FOR ANXIETY. (Patient not taking: Reported on 04/30/2015) 24 tablet 0  . ondansetron (ZOFRAN) 4 MG tablet Take 1 tablet (4 mg total) by mouth every 8 (eight) hours as needed for nausea  or vomiting. (Patient not taking: Reported on 04/30/2015) 12 tablet 0   No current facility-administered medications for this visit.    Objective: BP 150/64 mmHg  Pulse 74  Temp(Src) 98.6 F (37 C)  Wt 183 lb (83.008 kg) Gen: NAD, resting comfortably CV: RRR no murmurs rubs or gallops Lungs: CTAB no crackles, wheeze, rhonchi Abdomen: soft/nontender/nondistended/normal bowel sounds. No rebound or guarding.  Ext: 1+ edema 10 cm below tibial plateau measures 39 cm L , 40 cm R.  Pain in bilateral calves L >R Skin: warm, dry Neuro: grossly normal, moves all extremities  Assessment/Plan:  Bilateral calf pain L >R in patient with DVT history Suspicious for: Acute on chronic diastolic CHF S:For 1 week has noted increased swelling in both calves. Left calf has been particularly painful rated as moderate aching. Has not increased in intensity over last week since it started. Swelling not improving. Compliant with lasix. Weight noted up 5 lbs form last visit.  A/P: rule out DVT with bilateral venous duplex. Dr. Regis Bill overview states under DVT history ""RLE" (09/09/2012) coumadine cant take asa so on plavix ". Patient is on plavix and appears indication is prior DVT. This will be continued. If no DVT- I am suspicious that this may be from acute on chronic diastolic CHF considering chronic lasix and still with 5 lb weight gain as well as edema. Plan would be 5 days of lasix BID and reassess if not improved or worsens.   Per orders VAS [place] LOWER EXTREMITY VENOUS (DVT)  Emergent Return precautions advised.

## 2015-04-30 NOTE — Patient Instructions (Signed)
Due to your history of blood clots, I want to rule out blood clot in the legs as source of swelling and pain.   Do not leave today until you have an appointment for the ultrasound  If ultrasound does not show clot, I want you to take lasix in the morning and then repeat in 6 hours for the next 5 days. If symptoms do not improve, may return to see [place]

## 2015-04-30 NOTE — Telephone Encounter (Signed)
Sent to the pharmacy by e-scribe for 6 months.  Pt has upcoming appt on 09/06/15

## 2015-05-01 ENCOUNTER — Other Ambulatory Visit: Payer: Self-pay | Admitting: Internal Medicine

## 2015-05-03 NOTE — Telephone Encounter (Signed)
Sent to the pharmacy by e-scribe. 

## 2015-05-07 DIAGNOSIS — M5416 Radiculopathy, lumbar region: Secondary | ICD-10-CM | POA: Diagnosis not present

## 2015-05-07 DIAGNOSIS — M5115 Intervertebral disc disorders with radiculopathy, thoracolumbar region: Secondary | ICD-10-CM | POA: Diagnosis not present

## 2015-05-07 DIAGNOSIS — M47816 Spondylosis without myelopathy or radiculopathy, lumbar region: Secondary | ICD-10-CM | POA: Diagnosis not present

## 2015-05-10 ENCOUNTER — Other Ambulatory Visit: Payer: Self-pay | Admitting: Internal Medicine

## 2015-05-12 NOTE — Telephone Encounter (Signed)
Sent to the pharmacy by e-scribe. 

## 2015-06-01 ENCOUNTER — Other Ambulatory Visit: Payer: Self-pay | Admitting: Family Medicine

## 2015-06-02 ENCOUNTER — Other Ambulatory Visit: Payer: Self-pay | Admitting: Internal Medicine

## 2015-06-03 MED ORDER — LORAZEPAM 1 MG PO TABS: ORAL_TABLET | ORAL | Status: DC

## 2015-06-03 NOTE — Telephone Encounter (Signed)
May refill x1 

## 2015-06-03 NOTE — Telephone Encounter (Signed)
Called to the pharmacy and left on machine. 

## 2015-06-03 NOTE — Telephone Encounter (Signed)
Ok x 1

## 2015-06-07 ENCOUNTER — Other Ambulatory Visit: Payer: Self-pay | Admitting: Internal Medicine

## 2015-06-11 DIAGNOSIS — H04203 Unspecified epiphora, bilateral lacrimal glands: Secondary | ICD-10-CM | POA: Diagnosis not present

## 2015-06-11 DIAGNOSIS — H40023 Open angle with borderline findings, high risk, bilateral: Secondary | ICD-10-CM | POA: Diagnosis not present

## 2015-06-11 DIAGNOSIS — H25013 Cortical age-related cataract, bilateral: Secondary | ICD-10-CM | POA: Diagnosis not present

## 2015-06-11 DIAGNOSIS — H2513 Age-related nuclear cataract, bilateral: Secondary | ICD-10-CM | POA: Diagnosis not present

## 2015-06-28 ENCOUNTER — Telehealth: Payer: Self-pay | Admitting: Internal Medicine

## 2015-06-28 NOTE — Telephone Encounter (Signed)
FYI

## 2015-06-28 NOTE — Telephone Encounter (Signed)
Pt has scheduled an appt on 06/29/15

## 2015-06-28 NOTE — Telephone Encounter (Signed)
[facility] - Client [place] [facility] Patient Name: MYSTIC RODD DOB: [birth year] Initial Comment Caller states she is having a cough- hoarse voice- Nurse Assessment Nurse: Markus Daft, RN, [place] Date/Time (Eastern Time): 06/28/2015 11:14:17 AM Confirm and document reason for call. If symptomatic, describe symptoms. You must click the next button to save text entered. ---Caller states she is having dry cough with hoarseness worse on Saturday night. She has had scratchy throat - no pain, cough started Friday. Tickle in her throat makes her cough. Taking Mucinex. Also started with wheezing yesterday. Nauseated. No fever. Has the patient traveled out of the country within the last 30 days? ---No Does the patient have any new or worsening symptoms? ---Yes Will a triage be completed? ---Yes Related visit to physician within the last 2 weeks? ---No Does the PT have any chronic conditions? (i.e. diabetes, asthma, etc.) ---Yes List chronic conditions. ---Asthma, h/o bronchitis 2016, HTN - tribenzor (olmesartan, amlodipine, HCTZ) and metoprolol Is this a behavioral health or substance abuse call? ---No Guidelines Guideline Title Affirmed Question Affirmed Notes Asthma Attack Sinus pain (around cheekbone or eye) Hoarseness Mild hoarseness (all triage questions negative) Final Disposition [place], RN, [place] Comments Pt last used her nebulizer at 10 am. NO wheezing now, and very seldom with coughing. c/o sinus pain. Appt made with Dr. Regis Bill tomorrow at 9:45 am. Referrals REFERRED TO PCP OFFICE Disagree/Comply: Comply Disagree/Comply: Comply

## 2015-06-29 ENCOUNTER — Encounter: Payer: Self-pay | Admitting: Internal Medicine

## 2015-06-29 ENCOUNTER — Ambulatory Visit (INDEPENDENT_AMBULATORY_CARE_PROVIDER_SITE_OTHER): Payer: Medicare Other | Admitting: Internal Medicine

## 2015-06-29 VITALS — BP 146/60 | HR 63 | Temp 98.4°F | Ht 61.0 in | Wt 187.0 lb

## 2015-06-29 DIAGNOSIS — J45901 Unspecified asthma with (acute) exacerbation: Secondary | ICD-10-CM

## 2015-06-29 DIAGNOSIS — J018 Other acute sinusitis: Secondary | ICD-10-CM | POA: Diagnosis not present

## 2015-06-29 DIAGNOSIS — J069 Acute upper respiratory infection, unspecified: Secondary | ICD-10-CM

## 2015-06-29 MED ORDER — METHYLPREDNISOLONE ACETATE 80 MG/ML IJ SUSP
120.0000 mg | Freq: Once | INTRAMUSCULAR | Status: AC
Start: 2015-06-29 — End: 2015-06-29
  Administered 2015-06-29: 120 mg via INTRAMUSCULAR

## 2015-06-29 MED ORDER — DOXYCYCLINE HYCLATE 100 MG PO TABS: 100.0000 mg | ORAL_TABLET | Freq: Two times a day (BID) | ORAL | Status: DC

## 2015-06-29 NOTE — Progress Notes (Signed)
Pre visit review using our clinic review tool, if applicable. No additional management support is needed unless otherwise documented below in the visit note.  Chief Complaint  Patient presents with  . Cough  . Nasal Congestion  . Headache  . Hoarse    HPI: Patient [name]  comes in today for SDA for  new problem evaluation. Sent in by team health for 3 -4days of scratchy throat and cough  Using neb  But sinus pain . Left face    No fever  Had sick to stomach from mucous  But no diarrhea. Using neb   Helps some  Hoarse and cough   ROS: See pertinent positives and negatives per HPI. Sore lump under lr breast bra line  No pleurisy  Hemoptysis  Fever chilles  Past Medical History  Diagnosis Date  . Asthma     " onset when young"   . History of DVT (deep vein thrombosis)     "RLE" (09/09/2012) coumadine cant take asa so on plavix   . Allergy   . GERD (gastroesophageal reflux disease)   . Hyperlipidemia   . Hypertension   . Varicose veins   . Lupus ([place])     "cured years ago" (09/09/2012)  . Headache(784.0)     "related to my high blood pressure" (09/09/2012)  . Arthritis     "shoulders" (09/09/2012)  . Anxiety   . Anemia   . Diabetes mellitus without complication ([place])     "Borderline" per pt; being monitored    Family History  Problem Relation Age of Onset  . Hypertension Mother   . Hypertension Father   . Cancer Sister     Colon and Breast Cancer  . Hypertension Brother   . Heart attack Daughter   . Lung cancer      both parents     Social History   Social History  . Marital Status: Single    Spouse Name: N/A  . Number of Children: N/A  . Years of Education: N/A   Social History Main Topics  . Smoking status: Never Smoker   . Smokeless tobacco: Never Used  . Alcohol Use: No  . Drug Use: No  . Sexual Activity: Not Asked   Other Topics Concern  . None   Social History Narrative   2 people living in the home.  Grand daughter.   Up and down  through the night      Had 7 children  2 deceased . 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    works 3 days per week currently .   Glasses dentures  Neg tad     Outpatient Prescriptions Prior to Visit  Medication Sig Dispense Refill  . acetaminophen (TYLENOL) 500 MG tablet Take 1,000 mg by mouth every 6 (six) hours as needed for moderate pain. Reported on 04/30/2015    . ADVAIR DISKUS 250-50 MCG/DOSE AEPB USE 1 PUFF TWICE DAILY. 60 each 0  . albuterol (PROVENTIL) (2.5 MG/3ML) 0.083% nebulizer solution USE 1 AMPULE EVERY 6 HOURS AS NEEDED FOR WHEEZE 75 mL 1  . clopidogrel (PLAVIX) 75 MG tablet TAKE 1 TABLET ONCE DAILY. 30 tablet 5  . diclofenac sodium (VOLTAREN) 1 % GEL APPLY 4GM TO AFFECTED AREA(S) 4 TIMES A DAY. 100 g 1  . doxazosin (CARDURA) 2 MG tablet TAKE 3 TABLETS AT BEDTIME. 90 tablet 5  . fenofibrate  160 MG tablet TAKE 1 TABLET ONCE DAILY. 90 tablet 1  . ferrous sulfate 325 (65 FE) MG EC tablet Take 1 tablet (325 mg total) by mouth daily with breakfast. 30 tablet 3  . furosemide (LASIX) 20 MG tablet TAKE 1 TABLET ONCE DAILY. 30 tablet 5  . guaiFENesin (MUCINEX) 600 MG 12 hr tablet Take 600 mg by mouth 2 (two) times daily as needed for cough or to loosen phlegm. Reported on 04/30/2015    . LORazepam (ATIVAN) 1 MG tablet TAKE 1 TABLET EVERY 8 HOURS AS NEEDED FOR ANXIETY. 24 tablet 0  . metoprolol succinate (TOPROL-XL) 50 MG 24 hr tablet TAKE 1 TABLET ONCE DAILY WITH FOOD. 30 tablet 4  . montelukast (SINGULAIR) 10 MG tablet TAKE 1 TABLET ONCE DAILY. 90 tablet 1  . ondansetron (ZOFRAN) 4 MG tablet Take 1 tablet (4 mg total) by mouth every 8 (eight) hours as needed for nausea or vomiting. 12 tablet 0  . pantoprazole (PROTONIX) 40 MG tablet TAKE 1 TABLET ONCE DAILY. 30 tablet 5  . PAZEO 0.7 % SOLN     . potassium chloride (K-DUR) 10 MEQ tablet TAKE 4 TABLETS DAILY. 120 tablet 5  . PROAIR HFA 108 (90 Base)  MCG/ACT inhaler USE 2 PUFFS EVERY 6 HOURS AS NEEDED FOR WHEEZING. 8.5 g 0  . RESTASIS 0.05 % ophthalmic emulsion     . TRIBENZOR 40-10-25 MG TABS TAKE 1 TABLET ONCE DAILY. 30 tablet 5  . vitamin B-12 (CYANOCOBALAMIN) 1000 MCG tablet Take 1,000 mcg by mouth daily.    [name] HYDROcodone-acetaminophen (NORCO/VICODIN) 5-325 MG tablet Reported on 06/29/2015     No facility-administered medications prior to visit.     EXAM:  BP 146/60 mmHg  Pulse 63  Temp(Src) 98.4 F (36.9 C) (Oral)  Ht 5\' 1"  (1.549 m)  Wt 187 lb (84.823 kg)  BMI 35.35 kg/m2  SpO2 98%  Body mass index is 35.35 kg/(m^2). WDWN in NAD  quiet respirations; very congested  Nares  somewhat hoarse. Non toxic . HEENT: Normocephalic ;atraumatic , Eyes;  Eye lids seem puffy  No redness eyeball ,,Ears: no deformities, canals nl, TM landmarks normal, Nose: no deformity or mucoid  but congested;face left maxilla frontal area  tender Mouth : OP clear without lesion or edema . Neck: Supple without adenopathy or masses or bruits Chest:  Clear to A without wheezes rales or rhonchi but seems tight  Air movement  CV:  S1-S2 no gallops or murmurs peripheral perfusion is normal Skin :nl perfusion and no acute rashes  Sore a lump right abd tiny Perry seed like area  ? If breast related under lbra line    ASSESSMENT AND PLAN:  Discussed the following assessment and plan:  Other acute sinusitis - Plan: methylPREDNISolone acetate (DEPO-MEDROL) injection 120 mg  Asthma, chronic, unspecified asthma severity, with acute exacerbation - Plan: methylPREDNISolone acetate (DEPO-MEDROL) injection 120 mg  URI, acute Looks very uncomfortable  And facial  Sx   Sinusitis    Antibiotic  Cause of severity of sx  And  Steroid for the asthmatic component  If cannot tolerate med  Contact us    Expectant management.  consider x ray  other -Patient advised to return or notify health care team  if symptoms worsen ,persist or new concerns arise.  Patient  Instructions  Antibiotic for  Left sinusitis  Take  Something in stomach  Increase fluids as tolerated    .  Continue nebulizer . Steroid shot today   To  help with wheezing .  Expect  Improvement in the next  3-4 days  Ut cough can remain for another 10 days   Fu if not improving as expected or fever  Etc.  Can try  afrin nose spray also for 3 days for sinus congestion.  Saline nose spray also    Sinusitis, Adult Sinusitis is redness, soreness, and inflammation of the paranasal sinuses. Paranasal sinuses are air pockets within the bones of your face. They are located beneath your eyes, in the middle of your forehead, and above your eyes. In healthy paranasal sinuses, mucus is able to drain out, and air is able to circulate through them by way of your nose. However, when your paranasal sinuses are inflamed, mucus and air can become trapped. This can allow bacteria and other germs to grow and cause infection. Sinusitis can develop quickly and last only a short time (acute) or continue over a long period (chronic). Sinusitis that lasts for more than 12 weeks is considered chronic. CAUSES Causes of sinusitis include:  Allergies.  Structural abnormalities, such as displacement of the cartilage that separates your nostrils (deviated septum), which can decrease the air flow through your nose and sinuses and affect sinus drainage.  Functional abnormalities, such as when the small hairs (cilia) that line your sinuses and help remove mucus do not work properly or are not present. SIGNS AND SYMPTOMS Symptoms of acute and chronic sinusitis are the same. The primary symptoms are pain and pressure around the affected sinuses. Other symptoms include:  Upper toothache.  Earache.  Headache.  Bad breath.  Decreased sense of smell and taste.  A cough, which worsens when you are lying flat.  Fatigue.  Fever.  Thick drainage from your nose, which often is green and may contain pus  (purulent).  Swelling and warmth over the affected sinuses. DIAGNOSIS Your health care provider will perform a physical exam. During your exam, your health care provider may perform any of the following to help determine if you have acute sinusitis or chronic sinusitis:  Look in your nose for signs of abnormal growths in your nostrils (nasal polyps).  Tap over the affected sinus to check for signs of infection.  View the inside of your sinuses using an imaging device that has a light attached (endoscope). If your health care provider suspects that you have chronic sinusitis, one or more of the following tests may be recommended:  Allergy tests.  Nasal culture. A sample of mucus is taken from your nose, sent to a lab, and screened for bacteria.  Nasal cytology. A sample of mucus is taken from your nose and examined by your health care provider to determine if your sinusitis is related to an allergy. TREATMENT Most cases of acute sinusitis are related to a viral infection and will resolve on their own within 10 days. Sometimes, medicines are prescribed to help relieve symptoms of both acute and chronic sinusitis. These may include pain medicines, decongestants, nasal steroid sprays, or saline sprays. However, for sinusitis related to a bacterial infection, your health care provider will prescribe antibiotic medicines. These are medicines that will help kill the bacteria causing the infection. Rarely, sinusitis is caused by a fungal infection. In these cases, your health care provider will prescribe antifungal medicine. For some cases of chronic sinusitis, surgery is needed. Generally, these are cases in which sinusitis recurs more than 3 times per year, despite other treatments. HOME CARE INSTRUCTIONS  Drink plenty of water. Water helps thin the  mucus so your sinuses can drain more easily.  Use a humidifier.  Inhale steam 3-4 times a day (for example, sit in the bathroom with the shower  running).  Apply a warm, moist washcloth to your face 3-4 times a day, or as directed by your health care provider.  Use saline nasal sprays to help moisten and clean your sinuses.  Take medicines only as directed by your health care provider.  If you were prescribed either an antibiotic or antifungal medicine, finish it all even if you start to feel better. SEEK IMMEDIATE MEDICAL CARE IF:  You have increasing pain or severe headaches.  You have nausea, vomiting, or drowsiness.  You have swelling around your face.  You have vision problems.  You have a stiff neck.  You have difficulty breathing.   This information is not intended to replace advice given to you by your health care provider. Make sure you discuss any questions you have with your health care provider.   Document Released: 05/01/2005 Document Revised: 05/22/2014 Document Reviewed: 05/16/2011 Elsevier Interactive Patient Education 2016 [place] K. Gabriele Zwilling M.D.  ---Caller states she is having dry cough with hoarseness worse on Saturday night. She has had scratchy throat - no pain, cough started Friday. Tickle in her throat makes her cough. Taking Mucinex. Also started with wheezing yesterday. Nauseated. No fever. Has the patient traveled out of the country within the last 30 days? ---No Does the patient have any new or worsening symptoms? ---Yes Will a triage be completed? ---Yes Related visit to physician within the last 2 weeks? ---No Does the PT have any chronic conditions? (i.e. diabetes, asthma, etc.) ---Yes List chronic conditions. ---Asthma, h/o bronchitis 2016, HTN - tribenzor (olmesartan, amlodipine, HCTZ) and metoprolol Is this a behavioral health or substance abuse call? ---No Guidelines Guideline Title Affirmed Question Affirmed Notes Asthma Attack Sinus pain (around cheekbone or eye) Hoarseness Mild hoarseness (all triage questions negative) Final Disposition [place], RN, [place] Comments Pt last used her nebulizer at 10 am. NO wheezing now, and very seldom with coughing. c/o sinus pain. Appt made with Dr. Regis Bill tomorrow at 9:45 am. Referrals REFERRED TO PCP OFFICE Disagree/Comply: Comply Disagree/Comply: Comply

## 2015-06-29 NOTE — Patient Instructions (Signed)
Antibiotic for  Left sinusitis  Take  Something in stomach  Increase fluids as tolerated    .  Continue nebulizer . Steroid shot today   To help with wheezing .  Expect  Improvement in the next  3-4 days  Ut cough can remain for another 10 days   Fu if not improving as expected or fever  Etc.  Can try  afrin nose spray also for 3 days for sinus congestion.  Saline nose spray also    Sinusitis, Adult Sinusitis is redness, soreness, and inflammation of the paranasal sinuses. Paranasal sinuses are air pockets within the bones of your face. They are located beneath your eyes, in the middle of your forehead, and above your eyes. In healthy paranasal sinuses, mucus is able to drain out, and air is able to circulate through them by way of your nose. However, when your paranasal sinuses are inflamed, mucus and air can become trapped. This can allow bacteria and other germs to grow and cause infection. Sinusitis can develop quickly and last only a short time (acute) or continue over a long period (chronic). Sinusitis that lasts for more than 12 weeks is considered chronic. CAUSES Causes of sinusitis include:  Allergies.  Structural abnormalities, such as displacement of the cartilage that separates your nostrils (deviated septum), which can decrease the air flow through your nose and sinuses and affect sinus drainage.  Functional abnormalities, such as when the small hairs (cilia) that line your sinuses and help remove mucus do not work properly or are not present. SIGNS AND SYMPTOMS Symptoms of acute and chronic sinusitis are the same. The primary symptoms are pain and pressure around the affected sinuses. Other symptoms include:  Upper toothache.  Earache.  Headache.  Bad breath.  Decreased sense of smell and taste.  A cough, which worsens when you are lying flat.  Fatigue.  Fever.  Thick drainage from your nose, which often is green and may contain pus (purulent).  Swelling and  warmth over the affected sinuses. DIAGNOSIS Your health care provider will perform a physical exam. During your exam, your health care provider may perform any of the following to help determine if you have acute sinusitis or chronic sinusitis:  Look in your nose for signs of abnormal growths in your nostrils (nasal polyps).  Tap over the affected sinus to check for signs of infection.  View the inside of your sinuses using an imaging device that has a light attached (endoscope). If your health care provider suspects that you have chronic sinusitis, one or more of the following tests may be recommended:  Allergy tests.  Nasal culture. A sample of mucus is taken from your nose, sent to a lab, and screened for bacteria.  Nasal cytology. A sample of mucus is taken from your nose and examined by your health care provider to determine if your sinusitis is related to an allergy. TREATMENT Most cases of acute sinusitis are related to a viral infection and will resolve on their own within 10 days. Sometimes, medicines are prescribed to help relieve symptoms of both acute and chronic sinusitis. These may include pain medicines, decongestants, nasal steroid sprays, or saline sprays. However, for sinusitis related to a bacterial infection, your health care provider will prescribe antibiotic medicines. These are medicines that will help kill the bacteria causing the infection. Rarely, sinusitis is caused by a fungal infection. In these cases, your health care provider will prescribe antifungal medicine. For some cases of chronic sinusitis, surgery is  needed. Generally, these are cases in which sinusitis recurs more than 3 times per year, despite other treatments. HOME CARE INSTRUCTIONS  Drink plenty of water. Water helps thin the mucus so your sinuses can drain more easily.  Use a humidifier.  Inhale steam 3-4 times a day (for example, sit in the bathroom with the shower running).  Apply a warm,  moist washcloth to your face 3-4 times a day, or as directed by your health care provider.  Use saline nasal sprays to help moisten and clean your sinuses.  Take medicines only as directed by your health care provider.  If you were prescribed either an antibiotic or antifungal medicine, finish it all even if you start to feel better. SEEK IMMEDIATE MEDICAL CARE IF:  You have increasing pain or severe headaches.  You have nausea, vomiting, or drowsiness.  You have swelling around your face.  You have vision problems.  You have a stiff neck.  You have difficulty breathing.   This information is not intended to replace advice given to you by your health care provider. Make sure you discuss any questions you have with your health care provider.   Document Released: 05/01/2005 Document Revised: 05/22/2014 Document Reviewed: 05/16/2011 Elsevier Interactive Patient Education [employer].

## 2015-07-02 ENCOUNTER — Other Ambulatory Visit: Payer: Self-pay

## 2015-07-02 DIAGNOSIS — Z1231 Encounter for screening mammogram for malignant neoplasm of breast: Secondary | ICD-10-CM

## 2015-07-06 ENCOUNTER — Other Ambulatory Visit: Payer: Self-pay | Admitting: Internal Medicine

## 2015-07-06 NOTE — Telephone Encounter (Signed)
Sent to the pharmacy by e-scribe. 

## 2015-07-18 ENCOUNTER — Other Ambulatory Visit: Payer: Self-pay | Admitting: Internal Medicine

## 2015-07-19 NOTE — Telephone Encounter (Signed)
Ok x 1

## 2015-07-19 NOTE — Telephone Encounter (Signed)
Left a message on machine

## 2015-07-26 DIAGNOSIS — M47816 Spondylosis without myelopathy or radiculopathy, lumbar region: Secondary | ICD-10-CM | POA: Diagnosis not present

## 2015-08-03 ENCOUNTER — Other Ambulatory Visit: Payer: Self-pay | Admitting: Internal Medicine

## 2015-08-03 NOTE — Telephone Encounter (Signed)
Ok x 2  

## 2015-08-04 NOTE — Telephone Encounter (Signed)
Sent to the pharmacy by e-scribe. 

## 2015-08-09 ENCOUNTER — Ambulatory Visit
Admission: RE | Admit: 2015-08-09 | Discharge: 2015-08-09 | Disposition: A | Discharge status: Home / Self Care | Payer: Medicare Other | Source: Ambulatory Visit

## 2015-08-09 DIAGNOSIS — Z1231 Encounter for screening mammogram for malignant neoplasm of breast: Secondary | ICD-10-CM

## 2015-08-16 ENCOUNTER — Other Ambulatory Visit (HOSPITAL_BASED_OUTPATIENT_CLINIC_OR_DEPARTMENT_OTHER): Payer: Medicare Other

## 2015-08-16 DIAGNOSIS — D649 Anemia, unspecified: Secondary | ICD-10-CM

## 2015-08-16 LAB — COMPREHENSIVE METABOLIC PANEL
ALBUMIN: 3.7 g/dL (ref 3.5–5.0)
ALK PHOS: 30 U/L — AB (ref 40–150)
ALT: 19 U/L (ref 0–55)
ANION GAP: 9 meq/L (ref 3–11)
AST: 28 U/L (ref 5–34)
BILIRUBIN TOTAL: 0.36 mg/dL (ref 0.20–1.20)
BUN: 37 mg/dL — ABNORMAL HIGH (ref 7.0–26.0)
CALCIUM: 9.7 mg/dL (ref 8.4–10.4)
CO2: 28 mEq/L (ref 22–29)
Chloride: 103 mEq/L (ref 98–109)
Creatinine: 1.6 mg/dL — ABNORMAL HIGH (ref 0.6–1.1)
EGFR: 36 mL/min/{1.73_m2} — AB (ref >90)
GLUCOSE: 118 mg/dL (ref 70–140)
Potassium: 3.7 mEq/L (ref 3.5–5.1)
SODIUM: 139 meq/L (ref 136–145)
TOTAL PROTEIN: 6.8 g/dL (ref 6.4–8.3)

## 2015-08-16 LAB — FERRITIN: Ferritin: 105 ng/ml (ref 9–269)

## 2015-08-16 LAB — CBC & DIFF AND RETIC
BASO%: 0.2 % (ref 0.0–2.0)
BASOS ABS: 0 10*3/uL (ref 0.0–0.1)
EOS ABS: 0.5 10*3/uL (ref 0.0–0.5)
EOS%: 10 % — AB (ref 0.0–7.0)
HEMATOCRIT: 31.3 % — AB (ref 34.8–46.6)
HEMOGLOBIN: 10.4 g/dL — AB (ref 11.6–15.9)
Immature Retic Fract: 1.7 % (ref 1.60–10.00)
LYMPH%: 35.2 % (ref 14.0–49.7)
MCH: 28.2 pg (ref 25.1–34.0)
MCHC: 33.2 g/dL (ref 31.5–36.0)
MCV: 84.8 fL (ref 79.5–101.0)
MONO#: 0.4 10*3/uL (ref 0.1–0.9)
MONO%: 6.7 % (ref 0.0–14.0)
NEUT#: 2.6 10*3/uL (ref 1.5–6.5)
NEUT%: 47.9 % (ref 38.4–76.8)
PLATELETS: 194 10*3/uL (ref 145–400)
RBC: 3.69 10*6/uL — ABNORMAL LOW (ref 3.70–5.45)
RDW: 13.8 % (ref 11.2–14.5)
Retic %: 1.61 % (ref 0.70–2.10)
Retic Ct Abs: 59.41 10*3/uL (ref 33.70–90.70)
WBC: 5.4 10*3/uL (ref 3.9–10.3)
lymph#: 1.9 10*3/uL (ref 0.9–3.3)

## 2015-08-16 LAB — IRON AND TIBC
%SAT: 21 % (ref 21–57)
IRON: 76 ug/dL (ref 41–142)
TIBC: 358 ug/dL (ref 236–444)
UIBC: 281 ug/dL (ref 120–384)

## 2015-08-23 ENCOUNTER — Ambulatory Visit (HOSPITAL_BASED_OUTPATIENT_CLINIC_OR_DEPARTMENT_OTHER): Payer: Medicare Other | Admitting: Hematology

## 2015-08-23 ENCOUNTER — Encounter: Payer: Self-pay | Admitting: Hematology

## 2015-08-23 ENCOUNTER — Telehealth: Payer: Self-pay | Admitting: Hematology

## 2015-08-23 VITALS — BP 159/52 | HR 69 | Temp 98.0°F | Resp 18 | Wt 185.3 lb

## 2015-08-23 DIAGNOSIS — Z86718 Personal history of other venous thrombosis and embolism: Secondary | ICD-10-CM

## 2015-08-23 DIAGNOSIS — D638 Anemia in other chronic diseases classified elsewhere: Secondary | ICD-10-CM | POA: Diagnosis not present

## 2015-08-23 DIAGNOSIS — N183 Chronic kidney disease, stage 3 unspecified: Secondary | ICD-10-CM | POA: Insufficient documentation

## 2015-08-23 DIAGNOSIS — I10 Essential (primary) hypertension: Secondary | ICD-10-CM

## 2015-08-23 NOTE — Telephone Encounter (Signed)
Gave pt appt for jan & avs

## 2015-08-23 NOTE — Progress Notes (Signed)
[facility] PROGRESS NOTE Date of Visit: 02/16/2014  Angela Dawson, MD [place] [address]  DIAGNOSIS: CKD (chronic kidney disease) stage 3, GFR 30-59 ml/min  Anemia of chronic disease  Essential hypertension  History of DVT (deep vein thrombosis)  CHIEF COMPLAIN: follow up anemia   CURRENT THERAPY: Ferrous sulfate 325 mg daily with orange juice and B12 once daily .   INTERVAL HISTORY: [name] returns for follow up. She is accompanied by her granddaughter to the clinic today. Doing well overall, her main complaint is fatigue, which is mild to moderate, she is able to function well at home, she remains to be physically active. She denies any dyspnea, chest pain, or other symptoms.  MEDICAL HISTORY:  Past Medical History  Diagnosis Date  . Asthma     " onset when young"   . History of DVT (deep vein thrombosis)     "RLE" (09/09/2012) coumadine cant take asa so on plavix   . Allergy   . GERD (gastroesophageal reflux disease)   . Hyperlipidemia   . Hypertension   . Varicose veins   . Lupus ([place])     "cured years ago" (09/09/2012)  . Headache(784.0)     "related to my high blood pressure" (09/09/2012)  . Arthritis     "shoulders" (09/09/2012)  . Anxiety   . Anemia   . Diabetes mellitus without complication ([place])     "Borderline" per pt; being monitored     ALLERGIES:  is allergic to penicillins and aspirin.  MEDICATIONS:   Medication List       This list is accurate as of: 08/23/15 11:31 AM.  Always use your most recent med list.               acetaminophen 500 MG tablet  Commonly known as:  TYLENOL  Take 1,000 mg by mouth every 6 (six) hours as needed for moderate pain. Reported on 04/30/2015     ADVAIR DISKUS 250-50 MCG/DOSE Aepb  Generic drug:  Fluticasone-Salmeterol  USE 1 PUFF TWICE DAILY.     albuterol (2.5 MG/3ML) 0.083% nebulizer solution  Commonly known as:  PROVENTIL  USE 1 AMPULE  EVERY 6 HOURS AS NEEDED FOR WHEEZE     PROAIR HFA 108 (90 Base) MCG/ACT inhaler  Generic drug:  albuterol  USE 2 PUFFS EVERY 6 HOURS AS NEEDED FOR WHEEZING.     clopidogrel 75 MG tablet  Commonly known as:  PLAVIX  TAKE 1 TABLET ONCE DAILY.     diclofenac sodium 1 % Gel  Commonly known as:  VOLTAREN  APPLY 4GM TO AFFECTED AREA(S) 4 TIMES A DAY.     doxazosin 2 MG tablet  Commonly known as:  CARDURA  TAKE 3 TABLETS AT BEDTIME.     fenofibrate 160 MG tablet  TAKE 1 TABLET ONCE DAILY.     ferrous sulfate 325 (65 FE) MG EC tablet  Take 1 tablet (325 mg total) by mouth daily with breakfast.     furosemide 20 MG tablet  Commonly known as:  LASIX  TAKE 1 TABLET ONCE DAILY.     guaiFENesin 600 MG 12 hr tablet  Commonly known as:  MUCINEX  Take 600 mg by mouth 2 (two) times daily as needed for cough or to loosen phlegm. Reported on 04/30/2015     HYDROcodone-acetaminophen 5-325 MG tablet  Commonly known as:  NORCO/VICODIN  Reported on 06/29/2015     LORazepam 1 MG tablet  Commonly known as:  ATIVAN  TAKE 1 TABLET EVERY 8 HOURS AS NEEDED FOR ANXIETY.     metoprolol succinate 50 MG 24 hr tablet  Commonly known as:  TOPROL-XL  TAKE 1 TABLET ONCE DAILY WITH FOOD.     montelukast 10 MG tablet  Commonly known as:  SINGULAIR  TAKE 1 TABLET ONCE DAILY.     ondansetron 4 MG tablet  Commonly known as:  ZOFRAN  Take 1 tablet (4 mg total) by mouth every 8 (eight) hours as needed for nausea or vomiting.     pantoprazole 40 MG tablet  Commonly known as:  PROTONIX  TAKE 1 TABLET ONCE DAILY.     PAZEO 0.7 % Soln  Generic drug:  Olopatadine HCl     potassium chloride 10 MEQ tablet  Commonly known as:  K-DUR  TAKE 4 TABLETS DAILY.     RESTASIS 0.05 % ophthalmic emulsion  Generic drug:  cycloSPORINE     TRIBENZOR 40-10-25 MG Tabs  Generic drug:  Olmesartan-Amlodipine-HCTZ  TAKE 1 TABLET ONCE DAILY.     vitamin B-12 1000 MCG tablet  Commonly known as:  CYANOCOBALAMIN  Take  1,000 mcg by mouth daily.        SURGICAL HISTORY:  Past Surgical History  Procedure Laterality Date  . Abdominal hysterectomy      partial  . Breast surgery    . Carpal tunnel release Right 2000's  . Shoulder open rotator cuff repair Bilateral 2000's  . Carpal tunnel release Bilateral 10/21/2013    Procedure: BILATERAL  CARPAL TUNNEL RELEASE;  Surgeon: [name], MD;  Location: [facility];  Service: Orthopedics;  Laterality: Bilateral;  . Trigger finger release Right 10/21/2013    Procedure: RELEASE TRIGGER FINGER/A-1 PULLEY RIGHT MIDDLE AND RIGHT RING;  Surgeon: [name], MD;  Location: [facility];  Service: Orthopedics;  Laterality: Right;    REVIEW OF SYSTEMS:   Constitutional: Denies fevers, chills or abnormal weight loss Eyes: Denies blurriness of vision Ears, nose, mouth, throat, and face: Denies mucositis or sore throat Respiratory: Denies cough, dyspnea or wheezes Cardiovascular: Denies palpitation, chest discomfort or lower extremity swelling Gastrointestinal:  Denies nausea, heartburn or change in bowel habits Skin: Denies abnormal skin rashes Lymphatics: Denies new lymphadenopathy or easy bruising Neurological:Denies numbness, tingling or new weaknesses Behavioral/Psych: Mood is stable, no new changes  All other systems were reviewed with the patient and are negative.  PHYSICAL EXAMINATION: ECOG PERFORMANCE STATUS: 1  Blood pressure 159/52, pulse 69, temperature 98 F (36.7 C), temperature source Oral, resp. rate 18, weight 185 lb 4.8 oz (84.052 kg), SpO2 99 %.  GENERAL:alert, no distress and comfortable; elderly female who appears her stated age SKIN: skin color, texture, turgor are normal, no rashes or significant lesions EYES: normal, Conjunctiva are pink and non-injected, sclera clear OROPHARYNX:no exudate, no erythema and lips, buccal mucosa, and tongue normal  NECK: supple, thyroid normal size, non-tender, without  nodularity LYMPH:  no palpable lymphadenopathy in the cervical, axillary or supraclavicular LUNGS: clear to auscultation with normal breathing effort HEART: regular rate & rhythm and no murmurs and 1+ lower extremity edema with chronic venous changes ABDOMEN:abdomen soft, non-tender and normal bowel sounds Musculoskeletal:no cyanosis of digits and no clubbing  NEURO: alert & oriented x 3 with fluent speech, no focal motor/sensory deficits EXT: (+) b/l leg pitting edema up to knee    LABORATORY DATA:  Labs:  CBC Latest Ref Rng 08/16/2015 02/22/2015 11/23/2014  WBC 3.9 -  10.3 10e3/uL 5.4 3.7(L) 3.2(L)  Hemoglobin 11.6 - 15.9 g/dL 10.4(L) 10.5(L) 9.9(L)  Hematocrit 34.8 - 46.6 % 31.3(L) 32.2(L) 29.4(L)  Platelets 145 - 400 10e3/uL 194 197 229    CMP Latest Ref Rng 08/16/2015 03/01/2015 02/22/2015  Glucose 70 - 140 mg/dl 118 112(H) 121  BUN 7.0 - 26.0 mg/dL 37.0(H) 25(H) 25.4  Creatinine 0.6 - 1.1 mg/dL 1.6(H) 1.42(H) 1.5(H)  Sodium 136 - 145 mEq/L 139 134(L) 136  Potassium 3.5 - 5.1 mEq/L 3.7 3.9 3.7  Chloride 96 - 112 mEq/L - 95(L) -  CO2 22 - 29 mEq/L 28 30 28   Calcium 8.4 - 10.4 mg/dL 9.7 9.7 9.7  Total Protein 6.4 - 8.3 g/dL 6.8 - 6.8  Total Bilirubin 0.20 - 1.20 mg/dL 0.36 - 0.46  Alkaline Phos 40 - 150 U/L 30(L) - 29(L)  AST 5 - 34 U/L 28 - 22  ALT 0 - 55 U/L 19 - 13   Results for [name], [name] (MRN [id number]) as of 08/23/2015 11:01  Ref. Range 11/23/2014 09:20 02/22/2015 10:13 08/16/2015 11:35  Iron Latest Ref Range: 41-142 ug/dL 60 93 76  UIBC Latest Ref Range: 120-384 ug/dL 265 266 281  TIBC Latest Ref Range: 236-444 ug/dL 325 359 358  %SAT Latest Ref Range: 21-57 % 18 (L) 26 21  Ferritin Latest Ref Range: 9-269 ng/ml 112 104 105     ASSESSMENT: [name] 82 y.o. female with a history of Anemia from CKD    PLAN:  1. Anemia likely anemia of chronic disease/kidney disease.  --Her initial   iron study, ferritin,screening SPEP was within normal limits. Both her  ESR and CRP was within normal limits. Her TSH screening was negative. Her folate level in April of this year was within normal limits. She is on vitamin B12 with her last level of 1620 in April. Her anemia is likely secondary to her moderate chronic renal disease. - Her hemoglobin is 10.4 today. she is not symptomatic, and functions well.. We discussed the benefit on the risks of Epo injection, especially the risk of thrombosis, including heart attack and stroke. She had a history of DVT in the past. I would consider starting Aranesp injection if her hemoglobin persistently below 10. -Her repeated ferritin and iron level today are adequate. She will continue oral iron pill. -Follow-up CBC.   2. Chronic Renal Disease, stage 3 -Stable with creatinine around 1.5-1.8. Patient has been counseled to avoid nephrotoxins and to continue adequate hydration. Patient should continue with tight glycemic control and maintain a good blood pressure to decrease the chance further decline in renal function.   3. GERD/dysphagia.  --Patient had an EGD and a colonoscopy on 03/19/2013 by Dr. Olevia Perches. With iron index and her ferritin within normal limits, this is less likely to be resulting in her mild anemia.   4. History of DVT.  --Patient was informed that patients with a history of DVT or more prone to DVT recurrence. she denies any symptoms of swelling in the legs and she reports mobilization. She also denies any acute shortness of breath.   5. Hypertension  -Continue low salt diet and anti-hypertensive medications per PCP.   6. Follow-up  -Return to clinic in 9 months with repeated CBC and CMP, she is going to see her PCP Dr. Regis Bill late this month and follow with her every 6 months    All questions were answered. The patient knows to call the clinic with any problems, questions or concerns. We can  certainly see the patient much sooner if necessary.  I spent 15 minutes counseling the patient face to face. The  total time spent in the appointment was 20 minutes.  Truitt Merle  08/23/2015

## 2015-09-02 ENCOUNTER — Other Ambulatory Visit: Payer: Self-pay | Admitting: Internal Medicine

## 2015-09-03 NOTE — Telephone Encounter (Signed)
Lorazepam called to the pharmacy and left on machine.

## 2015-09-03 NOTE — Telephone Encounter (Signed)
Ok to refill meto[prolol for 6 months   Lorazepam x 1

## 2015-09-06 ENCOUNTER — Ambulatory Visit: Payer: Medicare Other | Admitting: Internal Medicine

## 2015-09-12 NOTE — Progress Notes (Signed)
Pre visit review using our clinic review tool, if applicable. No additional management support is needed unless otherwise documented below in the visit note.  Chief Complaint  Patient presents with  . Follow-up    HPI: [name] 82 y.o.  Chronic disease management   Anemia felt to be anemia of CD with renal insufficieny   Swelling  More than usualluy like in past  For a week.  No excess salt  .   breathign this week    Taking med.   Ocass lorazepam use     facet bracn ablation  Per dr Marlou Sa Dr Ernestina Patches seemsed not to  to help much  Asks fopr refill med cardura  ROS: See pertinent positives and negatives per HPI. No  bleeding  Past Medical History  Diagnosis Date  . Asthma     " onset when young"   . History of DVT (deep vein thrombosis)     "RLE" (09/09/2012) coumadine cant take asa so on plavix   . Allergy   . GERD (gastroesophageal reflux disease)   . Hyperlipidemia   . Hypertension   . Varicose veins   . Lupus ([place])     "cured years ago" (09/09/2012)  . Headache(784.0)     "related to my high blood pressure" (09/09/2012)  . Arthritis     "shoulders" (09/09/2012)  . Anxiety   . Anemia   . Diabetes mellitus without complication ([place])     "Borderline" per pt; being monitored    Family History  Problem Relation Age of Onset  . Hypertension Mother   . Hypertension Father   . Cancer Sister     Colon and Breast Cancer  . Hypertension Brother   . Heart attack Daughter   . Lung cancer      both parents     Social History   Social History  . Marital Status: Single    Spouse Name: N/A  . Number of Children: N/A  . Years of Education: N/A   Social History Main Topics  . Smoking status: Never Smoker   . Smokeless tobacco: Never Used  . Alcohol Use: No  . Drug Use: No  . Sexual Activity: Not Asked   Other Topics Concern  . None   Social History Narrative   2 people living in the home.  Grand daughter.   Up and down through the night      Had 7  children  2 deceased . 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    works 3 days per week currently .   Glasses dentures  Neg tad     Outpatient Prescriptions Prior to Visit  Medication Sig Dispense Refill  . acetaminophen (TYLENOL) 500 MG tablet Take 1,000 mg by mouth every 6 (six) hours as needed for moderate pain. Reported on 04/30/2015    . ADVAIR DISKUS 250-50 MCG/DOSE AEPB USE 1 PUFF TWICE DAILY. 60 each 2  . albuterol (PROVENTIL) (2.5 MG/3ML) 0.083% nebulizer solution USE 1 AMPULE EVERY 6 HOURS AS NEEDED FOR WHEEZE 75 mL 1  . fenofibrate 160 MG tablet TAKE 1 TABLET ONCE DAILY. 90 tablet 1  . ferrous sulfate 325 (65 FE) MG EC tablet Take 1 tablet (325 mg total) by mouth daily with breakfast. 30 tablet 3  . guaiFENesin (MUCINEX) 600 MG 12 hr tablet Take 600 mg by mouth 2 (two) times daily as needed  for cough or to loosen phlegm. Reported on 04/30/2015    . HYDROcodone-acetaminophen (NORCO/VICODIN) 5-325 MG tablet Reported on 06/29/2015    . metoprolol succinate (TOPROL-XL) 50 MG 24 hr tablet TAKE 1 TABLET ONCE DAILY WITH FOOD. 30 tablet 5  . montelukast (SINGULAIR) 10 MG tablet TAKE 1 TABLET ONCE DAILY. 90 tablet 1  . ondansetron (ZOFRAN) 4 MG tablet Take 1 tablet (4 mg total) by mouth every 8 (eight) hours as needed for nausea or vomiting. 12 tablet 0  . pantoprazole (PROTONIX) 40 MG tablet TAKE 1 TABLET ONCE DAILY. 30 tablet 5  . PAZEO 0.7 % SOLN     . PROAIR HFA 108 (90 Base) MCG/ACT inhaler USE 2 PUFFS EVERY 6 HOURS AS NEEDED FOR WHEEZING. 8.5 g 1  . RESTASIS 0.05 % ophthalmic emulsion     . TRIBENZOR 40-10-25 MG TABS TAKE 1 TABLET ONCE DAILY. 30 tablet 5  . vitamin B-12 (CYANOCOBALAMIN) 1000 MCG tablet Take 1,000 mcg by mouth daily.    . clopidogrel (PLAVIX) 75 MG tablet TAKE 1 TABLET ONCE DAILY. 30 tablet 5  . diclofenac sodium (VOLTAREN) 1 % GEL APPLY 4GM TO AFFECTED AREA(S) 4 TIMES A DAY. 100 g 1  .  doxazosin (CARDURA) 2 MG tablet TAKE 3 TABLETS AT BEDTIME. 90 tablet 5  . furosemide (LASIX) 20 MG tablet TAKE 1 TABLET ONCE DAILY. 30 tablet 5  . LORazepam (ATIVAN) 1 MG tablet TAKE 1 TABLET EVERY 8 HOURS AS NEEDED FOR ANXIETY. 24 tablet 0  . potassium chloride (K-DUR) 10 MEQ tablet TAKE 4 TABLETS DAILY. 120 tablet 5   No facility-administered medications prior to visit.     EXAM:  BP 156/60 mmHg  Pulse 61  Temp(Src) 98.2 F (36.8 C) (Oral)  Ht 5' 1"  (1.549 m)  Wt 187 lb 4.8 oz (84.959 kg)  BMI 35.41 kg/m2  SpO2 98%  Body mass index is 35.41 kg/(m^2).  GENERAL: vitals reviewed and listed above, alert, oriented, appears well hydrated and in no acute distress HEENT: atraumatic, conjunctiva  clear, no obvious abnormalities on inspection of external nose and ears OP : no lesion edema or exudate  NECK: no obvious masses on inspection palpation  LUNGS: clear to auscultation bilaterally, no wheezes, rales or rhonchi, good air movement CV: HRRR, no clubbing cyanosis 1+ peripheral edema nl cap refill  MS: moves all extremities without noticeable focal  abnormality PSYCH: pleasant and cooperative, no obvious depression or anxiety  BP Readings from Last 3 Encounters:  09/13/15 156/60  08/23/15 159/52  06/29/15 146/60    ASSESSMENT AND PLAN:  Discussed the following assessment and plan:  Essential hypertension  Renal insufficiency creatinine 1.3  Leg swelling  Medication management  Left low back pain, with sciatica presence unspecified  -Patient advised to return or notify health care team  if symptoms worsen ,persist or new concerns arise.  Patient Instructions  Increase the lasix to  One twice a day      Plan labs in 3-4 weeks  Potassium  Creatnine. I will put in the orders .  mway get renal nephrology to see you  Consider change in bp meds  To something like  Coreg  Instead of the cardura .  But this med can ocassionally make th asthma worse.  Sounds like you anemia  may be from  decrease  lidney function.      Standley Brooking. Norris Brumbach M.D.  See consults  PLAN:  1. Anemia likely anemia of chronic disease/kidney disease.  --Her initial  iron study, ferritin,screening SPEP was within normal limits. Both her ESR and CRP was within normal limits. Her TSH screening was negative. Her folate level in April of this year was within normal limits. She is on vitamin B12 with her last level of 1620 in April. Her anemia is likely secondary to her moderate chronic renal disease. - Her hemoglobin is 10.4 today. she is not symptomatic, and functions well.. We discussed the benefit on the risks of Epo injection, especially the risk of thrombosis, including heart attack and stroke. She had a history of DVT in the past. I would consider starting Aranesp injection if her hemoglobin persistently below 10. -Her repeated ferritin and iron level today are adequate. She will continue oral iron pill. -Follow-up CBC.   2. Chronic Renal Disease, stage 3 -Stable with creatinine around 1.5-1.8. Patient has been counseled to avoid nephrotoxins and to continue adequate hydration. Patient should continue with tight glycemic control and maintain a good blood pressure to decrease the chance further decline in renal function.   3. GERD/dysphagia.  --Patient had an EGD and a colonoscopy on 03/19/2013 by Dr. Olevia Perches. With iron index and her ferritin within normal limits, this is less likely to be resulting in her mild anemia.   4. History of DVT.  --Patient was informed that patients with a history of DVT or more prone to DVT recurrence. she denies any symptoms of swelling in the legs and she reports mobilization. She also denies any acute shortness of breath.  5. Hypertension  -Continue low salt diet and anti-hypertensive medications per PCP.   6. Follow-up  -Return to clinic in 9 months with repeated CBC and CMP, she is going to see her PCP Dr. Regis Bill late this month and follow with  her every 6 months    All questions were answered. The patient knows to call the clinic with any problems, questions or concerns. We can certainly see the patient much sooner if necessary.  I spent 15 minutes counseling the patient face to face. The total time spent in the appointment was 20 minutes.

## 2015-09-13 ENCOUNTER — Encounter: Payer: Self-pay | Admitting: Internal Medicine

## 2015-09-13 ENCOUNTER — Ambulatory Visit (INDEPENDENT_AMBULATORY_CARE_PROVIDER_SITE_OTHER): Payer: Medicare Other | Admitting: Internal Medicine

## 2015-09-13 ENCOUNTER — Other Ambulatory Visit: Payer: Self-pay | Admitting: Internal Medicine

## 2015-09-13 VITALS — BP 156/60 | HR 61 | Temp 98.2°F | Ht 61.0 in | Wt 187.3 lb

## 2015-09-13 DIAGNOSIS — M545 Low back pain: Secondary | ICD-10-CM

## 2015-09-13 DIAGNOSIS — I10 Essential (primary) hypertension: Secondary | ICD-10-CM | POA: Diagnosis not present

## 2015-09-13 DIAGNOSIS — Z79899 Other long term (current) drug therapy: Secondary | ICD-10-CM | POA: Diagnosis not present

## 2015-09-13 DIAGNOSIS — M7989 Other specified soft tissue disorders: Secondary | ICD-10-CM

## 2015-09-13 DIAGNOSIS — N289 Disorder of kidney and ureter, unspecified: Secondary | ICD-10-CM | POA: Diagnosis not present

## 2015-09-13 MED ORDER — DOXAZOSIN MESYLATE 2 MG PO TABS: ORAL_TABLET | ORAL | Status: DC

## 2015-09-13 MED ORDER — FUROSEMIDE 20 MG PO TABS: 20.0000 mg | ORAL_TABLET | Freq: Two times a day (BID) | ORAL | Status: DC

## 2015-09-13 NOTE — Patient Instructions (Signed)
Increase the lasix to  One twice a day      Plan labs in 3-4 weeks  Potassium  Creatnine. I will put in the orders .  mway get renal nephrology to see you  Consider change in bp meds  To something like  Coreg  Instead of the cardura .  But this med can ocassionally make th asthma worse.  Sounds like you anemia may be from  decrease  lidney function.

## 2015-09-14 NOTE — Telephone Encounter (Signed)
Sent to the pharmacy on 09/13/15 for 6 months while in ov

## 2015-09-23 ENCOUNTER — Other Ambulatory Visit: Payer: Self-pay | Admitting: Internal Medicine

## 2015-09-23 NOTE — Telephone Encounter (Signed)
Sent to the pharmacy by e-scribe. 

## 2015-09-27 DIAGNOSIS — M545 Low back pain: Secondary | ICD-10-CM | POA: Diagnosis not present

## 2015-09-27 DIAGNOSIS — M25562 Pain in left knee: Secondary | ICD-10-CM | POA: Diagnosis not present

## 2015-09-27 DIAGNOSIS — M25561 Pain in right knee: Secondary | ICD-10-CM | POA: Diagnosis not present

## 2015-10-01 DIAGNOSIS — M545 Low back pain: Secondary | ICD-10-CM | POA: Diagnosis not present

## 2015-10-04 ENCOUNTER — Other Ambulatory Visit (INDEPENDENT_AMBULATORY_CARE_PROVIDER_SITE_OTHER): Payer: Medicare Other

## 2015-10-04 ENCOUNTER — Other Ambulatory Visit: Payer: Self-pay | Admitting: Internal Medicine

## 2015-10-04 DIAGNOSIS — I10 Essential (primary) hypertension: Secondary | ICD-10-CM

## 2015-10-04 LAB — BASIC METABOLIC PANEL
BUN: 20 mg/dL (ref 6–23)
CHLORIDE: 95 meq/L — AB (ref 96–112)
CO2: 28 meq/L (ref 19–32)
Calcium: 9.4 mg/dL (ref 8.4–10.5)
Creatinine, Ser: 1.21 mg/dL — ABNORMAL HIGH (ref 0.40–1.20)
GFR: 55.1 mL/min — AB (ref >60.00)
GLUCOSE: 102 mg/dL — AB (ref 70–99)
POTASSIUM: 4.1 meq/L (ref 3.5–5.1)
SODIUM: 129 meq/L — AB (ref 135–145)

## 2015-10-04 NOTE — Telephone Encounter (Signed)
Sent to the pharmacy by e-scribe. 

## 2015-10-05 DIAGNOSIS — M545 Low back pain: Secondary | ICD-10-CM | POA: Diagnosis not present

## 2015-10-06 ENCOUNTER — Other Ambulatory Visit: Payer: Self-pay | Admitting: Internal Medicine

## 2015-10-14 ENCOUNTER — Other Ambulatory Visit: Payer: Self-pay | Admitting: Family Medicine

## 2015-10-14 NOTE — Telephone Encounter (Signed)
Refill request for Lorazepam 1 mg and send to [facility].

## 2015-10-15 DIAGNOSIS — M545 Low back pain: Secondary | ICD-10-CM | POA: Diagnosis not present

## 2015-10-15 MED ORDER — LORAZEPAM 1 MG PO TABS: ORAL_TABLET | ORAL | Status: DC

## 2015-10-15 NOTE — Telephone Encounter (Signed)
Ok to refill x 1  

## 2015-10-15 NOTE — Telephone Encounter (Signed)
Pt is requesting a refill. Last filled on 09/03/15 #24. Last OV 09/13/15. Okay to refill?

## 2015-10-15 NOTE — Telephone Encounter (Signed)
CALLED IN TO [facility].

## 2015-10-22 DIAGNOSIS — M545 Low back pain: Secondary | ICD-10-CM | POA: Diagnosis not present

## 2015-10-27 ENCOUNTER — Other Ambulatory Visit: Payer: Self-pay | Admitting: Internal Medicine

## 2015-10-27 NOTE — Telephone Encounter (Signed)
Sent to the pharmacy by e-scribe. 

## 2015-11-03 ENCOUNTER — Other Ambulatory Visit: Payer: Self-pay | Admitting: Internal Medicine

## 2015-11-11 ENCOUNTER — Other Ambulatory Visit: Payer: Self-pay | Admitting: Internal Medicine

## 2015-11-11 NOTE — Telephone Encounter (Signed)
Sent to the pharmacy by e-scribe. 

## 2015-11-26 DIAGNOSIS — M5442 Lumbago with sciatica, left side: Secondary | ICD-10-CM | POA: Diagnosis not present

## 2015-11-29 ENCOUNTER — Other Ambulatory Visit: Payer: Self-pay | Admitting: Orthopedic Surgery

## 2015-11-29 DIAGNOSIS — M545 Low back pain: Secondary | ICD-10-CM

## 2015-12-09 NOTE — Progress Notes (Signed)
Pre visit review using our clinic review tool, if applicable. No additional management support is needed unless otherwise documented below in the visit note.  Chief Complaint  Patient presents with  . Nasal Congestion  . Headache  . Cough    HPI: [name] 82 y.o.  sda appt with relative daugh 10 days ago onset .  mucinex      Onset  likea  Sinus  problens  No fever continue and down inchest . And head paoin  Persisting  Still some head pain .   Musous"Wont come out."  Nebulizer helps some and th4en not. Wheezing tight .    thick yellow  Feels wheezy  Usually better after a week or so but this is not.   Says ativan not strong enough  For anxiety at times .  Having back leg issuer  rx specialsit  Mostly left leg sx  ROS: See pertinent positives and negatives per HPI.  Past Medical History:  Diagnosis Date  . Allergy   . Anemia   . Anxiety   . Arthritis    "shoulders" (09/09/2012)  . Asthma    " onset when young"   . Diabetes mellitus without complication ([place])    "Borderline" per pt; being monitored  . GERD (gastroesophageal reflux disease)   . Headache(784.0)    "related to my high blood pressure" (09/09/2012)  . History of DVT (deep vein thrombosis)    "RLE" (09/09/2012) coumadine cant take asa so on plavix   . Hyperlipidemia   . Hypertension   . Lupus ([place])    "cured years ago" (09/09/2012)  . Varicose veins     Family History  Problem Relation Age of Onset  . Hypertension Mother   . Hypertension Father   . Cancer Sister     Colon and Breast Cancer  . Hypertension Brother   . Heart attack Daughter   . Lung cancer      both parents     Social History   Social History  . Marital status: Single    Spouse name: N/A  . Number of children: N/A  . Years of education: N/A   Social History Main Topics  . Smoking status: Never Smoker  . Smokeless tobacco: Never Used  . Alcohol use No  . Drug use: No  . Sexual activity: Not Asked   Other Topics Concern   . None   Social History Narrative   2 people living in the home.  Grand daughter.   Up and down through the night      Had 7 children  2 deceased . 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    works 3 days per week currently .   Glasses dentures  Neg tad     Outpatient Medications Prior to Visit  Medication Sig Dispense Refill  . acetaminophen (TYLENOL) 500 MG tablet Take 1,000 mg by mouth every 6 (six) hours as needed for moderate pain. Reported on 04/30/2015    . ADVAIR DISKUS 250-50 MCG/DOSE AEPB USE 1 PUFF TWICE DAILY. 60 each 2  . albuterol (PROVENTIL) (2.5 MG/3ML) 0.083% nebulizer solution USE 1 AMPULE EVERY 6 HOURS AS NEEDED FOR WHEEZE 75 mL 1  . clopidogrel (PLAVIX) 75 MG tablet TAKE 1 TABLET ONCE DAILY. 30 tablet 5  . diclofenac sodium (VOLTAREN) 1 % GEL APPLY 4GM TO AFFECTED AREA(S) 4 TIMES A DAY. 100  g 1  . doxazosin (CARDURA) 2 MG tablet TAKE 3 TABLETS AT BEDTIME. 90 tablet 5  . fenofibrate 160 MG tablet TAKE 1 TABLET ONCE DAILY. 90 tablet 0  . ferrous sulfate 325 (65 FE) MG EC tablet Take 1 tablet (325 mg total) by mouth daily with breakfast. 30 tablet 3  . furosemide (LASIX) 20 MG tablet Take 1 tablet (20 mg total) by mouth 2 (two) times daily. 60 tablet 3  . guaiFENesin (MUCINEX) 600 MG 12 hr tablet Take 600 mg by mouth 2 (two) times daily as needed for cough or to loosen phlegm. Reported on 04/30/2015    . HYDROcodone-acetaminophen (NORCO/VICODIN) 5-325 MG tablet Reported on 06/29/2015    . LORazepam (ATIVAN) 1 MG tablet TAKE 1 TABLET EVERY 8 HOURS AS NEEDED FOR ANXIETY. 24 tablet 1  . metoprolol succinate (TOPROL-XL) 50 MG 24 hr tablet TAKE 1 TABLET ONCE DAILY WITH FOOD. 30 tablet 5  . montelukast (SINGULAIR) 10 MG tablet TAKE 1 TABLET ONCE DAILY. 90 tablet 3  . pantoprazole (PROTONIX) 40 MG tablet TAKE 1 TABLET ONCE DAILY. 30 tablet 1  . potassium chloride (K-DUR) 10 MEQ tablet TAKE 4 TABLETS  DAILY. 120 tablet 2  . PROAIR HFA 108 (90 Base) MCG/ACT inhaler USE 2 PUFFS EVERY 6 HOURS AS NEEDED FOR WHEEZING. 8.5 g 1  . RESTASIS 0.05 % ophthalmic emulsion     . TRIBENZOR 40-10-25 MG TABS TAKE 1 TABLET ONCE DAILY. 30 tablet 1  . vitamin B-12 (CYANOCOBALAMIN) 1000 MCG tablet Take 1,000 mcg by mouth daily.    . ondansetron (ZOFRAN) 4 MG tablet Take 1 tablet (4 mg total) by mouth every 8 (eight) hours as needed for nausea or vomiting. 12 tablet 0  . PAZEO 0.7 % SOLN      No facility-administered medications prior to visit.      EXAM:  BP (!) 142/62 (BP Location: Right Arm, Patient Position: Sitting, Cuff Size: Normal)   Pulse 69   Temp 98.1 F (36.7 C) (Oral)   Ht 5\' 1"  (1.549 m)   Wt 183 lb 6.4 oz (83.2 kg)   SpO2 98%   BMI 34.65 kg/m   Body mass index is 34.65 kg/m.  GENERAL: vitals reviewed and listed above, alert, oriented, appears well hydrated and in no acute distress very congested and coughing   HEENT: atraumatic, conjunctiva  clear, no obvious abnormalities on inspection of external nose and ears tms clear frontal tenderness  OP : no lesion edema or exudate  NECK: no obvious masses on inspection palpation  LUNGS:  bs = end expiratory wheezing   No rales   CV: HRRR, no clubbing cyanosis 1-  2+  peripheral edema nl cap refill  MS: moves all extremities without noticeable focal  Abnormality left leg tender ( not new)  PSYCH: pleasant and cooperative,  Cognition seems intact  Wt Readings from Last 3 Encounters:  12/10/15 183 lb 6.4 oz (83.2 kg)  09/13/15 187 lb 4.8 oz (85 kg)  08/23/15 185 lb 4.8 oz (84.1 kg)   BP Readings from Last 3 Encounters:  12/10/15 (!) 142/62  09/13/15 (!) 156/60  08/23/15 (!) 159/52      ASSESSMENT AND PLAN:  Discussed the following assessment and plan:  Asthmatic bronchitis with acute exacerbation  Acute sinusitis with symptoms greater than 10 days  Renal insufficiency creatinine 1.3  Anxiety state - sertraline in past ? not  agree with her ? if ativan not helping would not go to other benzios looka  t controllers  Asthma mild internittent   Hx of chf sx  Echo mild lvh 2014 -Patient advised to return or notify health care team  if symptoms worsen ,persist or new concerns arise.  Patient Instructions  Take antibiotic and prednisone  For asthmatic bornchitis with infection. Can use the nebulizer  Every 6 hours until better. Expect improvement in the next 3 days and I fworse seek fu with medical team .  Advise anxiety controller med if the ativan not helpniog and having to use more it is dangerous to take these medication if you are on a narcotic pain pill also .  consdier trying low dose celexa   In the future   Plan fu to discuss more and poss begin after  Better from this respiratlry issue .     Standley Brooking. Wyatt Thorstenson M.D.

## 2015-12-10 ENCOUNTER — Ambulatory Visit (INDEPENDENT_AMBULATORY_CARE_PROVIDER_SITE_OTHER): Payer: Medicare Other | Admitting: Internal Medicine

## 2015-12-10 ENCOUNTER — Encounter: Payer: Self-pay | Admitting: Internal Medicine

## 2015-12-10 VITALS — BP 142/62 | HR 69 | Temp 98.1°F | Ht 61.0 in | Wt 183.4 lb

## 2015-12-10 DIAGNOSIS — N289 Disorder of kidney and ureter, unspecified: Secondary | ICD-10-CM | POA: Diagnosis not present

## 2015-12-10 DIAGNOSIS — F411 Generalized anxiety disorder: Secondary | ICD-10-CM

## 2015-12-10 DIAGNOSIS — J019 Acute sinusitis, unspecified: Secondary | ICD-10-CM

## 2015-12-10 DIAGNOSIS — J45901 Unspecified asthma with (acute) exacerbation: Secondary | ICD-10-CM

## 2015-12-10 MED ORDER — DOXYCYCLINE HYCLATE 100 MG PO TABS: 100.0000 mg | ORAL_TABLET | Freq: Two times a day (BID) | ORAL | 0 refills | Status: DC

## 2015-12-10 MED ORDER — CITALOPRAM HYDROBROMIDE 10 MG PO TABS: 10.0000 mg | ORAL_TABLET | Freq: Every day | ORAL | 1 refills | Status: DC

## 2015-12-10 MED ORDER — PREDNISONE 20 MG PO TABS: ORAL_TABLET | ORAL | 0 refills | Status: DC

## 2015-12-10 NOTE — Patient Instructions (Signed)
Take antibiotic and prednisone  For asthmatic bornchitis with infection. Can use the nebulizer  Every 6 hours until better. Expect improvement in the next 3 days and I fworse seek fu with medical team .  Advise anxiety controller med if the ativan not helpniog and having to use more it is dangerous to take these medication if you are on a narcotic pain pill also .  consdier trying low dose celexa   In the future   Plan fu to discuss more and poss begin after  Better from this respiratlry issue .

## 2015-12-13 ENCOUNTER — Ambulatory Visit
Admission: RE | Admit: 2015-12-13 | Discharge: 2015-12-13 | Disposition: A | Discharge status: Home / Self Care | Payer: Medicare Other | Source: Ambulatory Visit | Attending: Orthopedic Surgery | Admitting: Orthopedic Surgery

## 2015-12-13 DIAGNOSIS — M5136 Other intervertebral disc degeneration, lumbar region: Secondary | ICD-10-CM | POA: Diagnosis not present

## 2015-12-13 DIAGNOSIS — M545 Low back pain: Secondary | ICD-10-CM

## 2015-12-17 ENCOUNTER — Ambulatory Visit: Payer: Medicare Other | Admitting: Internal Medicine

## 2015-12-17 DIAGNOSIS — M5442 Lumbago with sciatica, left side: Secondary | ICD-10-CM | POA: Diagnosis not present

## 2015-12-24 ENCOUNTER — Ambulatory Visit: Payer: Medicare Other | Admitting: Internal Medicine

## 2015-12-27 ENCOUNTER — Other Ambulatory Visit: Payer: Self-pay | Admitting: Internal Medicine

## 2015-12-27 ENCOUNTER — Encounter: Payer: Self-pay | Admitting: Internal Medicine

## 2015-12-27 ENCOUNTER — Ambulatory Visit (INDEPENDENT_AMBULATORY_CARE_PROVIDER_SITE_OTHER): Payer: Medicare Other | Admitting: Internal Medicine

## 2015-12-27 VITALS — BP 138/62 | HR 66 | Temp 98.3°F | Ht 61.0 in | Wt 185.8 lb

## 2015-12-27 DIAGNOSIS — I10 Essential (primary) hypertension: Secondary | ICD-10-CM

## 2015-12-27 DIAGNOSIS — J45901 Unspecified asthma with (acute) exacerbation: Secondary | ICD-10-CM | POA: Diagnosis not present

## 2015-12-27 DIAGNOSIS — D638 Anemia in other chronic diseases classified elsewhere: Secondary | ICD-10-CM

## 2015-12-27 DIAGNOSIS — Z79899 Other long term (current) drug therapy: Secondary | ICD-10-CM | POA: Diagnosis not present

## 2015-12-27 DIAGNOSIS — N289 Disorder of kidney and ureter, unspecified: Secondary | ICD-10-CM

## 2015-12-27 DIAGNOSIS — R7301 Impaired fasting glucose: Secondary | ICD-10-CM

## 2015-12-27 DIAGNOSIS — R0600 Dyspnea, unspecified: Secondary | ICD-10-CM | POA: Diagnosis not present

## 2015-12-27 DIAGNOSIS — F411 Generalized anxiety disorder: Secondary | ICD-10-CM

## 2015-12-27 LAB — CBC WITH DIFFERENTIAL/PLATELET
BASOS ABS: 0 10*3/uL (ref 0.0–0.1)
Basophils Relative: 0.3 % (ref 0.0–3.0)
EOS ABS: 0.1 10*3/uL (ref 0.0–0.7)
Eosinophils Relative: 1.6 % (ref 0.0–5.0)
HEMATOCRIT: 31.9 % — AB (ref 36.0–46.0)
Hemoglobin: 10.6 g/dL — ABNORMAL LOW (ref 12.0–15.0)
LYMPHS PCT: 26.4 % (ref 12.0–46.0)
Lymphs Abs: 1 10*3/uL (ref 0.7–4.0)
MCHC: 33.4 g/dL (ref 30.0–36.0)
MCV: 85.3 fl (ref 78.0–100.0)
MONOS PCT: 8.7 % (ref 3.0–12.0)
Monocytes Absolute: 0.3 10*3/uL (ref 0.1–1.0)
NEUTROS ABS: 2.5 10*3/uL (ref 1.4–7.7)
Neutrophils Relative %: 63 % (ref 43.0–77.0)
PLATELETS: 180 10*3/uL (ref 150.0–400.0)
RBC: 3.74 Mil/uL — AB (ref 3.87–5.11)
RDW: 13.8 % (ref 11.5–15.5)
WBC: 3.9 10*3/uL — AB (ref 4.0–10.5)

## 2015-12-27 LAB — TSH: TSH: 2.29 u[IU]/mL (ref 0.35–4.50)

## 2015-12-27 LAB — BASIC METABOLIC PANEL
BUN: 29 mg/dL — AB (ref 6–23)
CO2: 28 meq/L (ref 19–32)
CREATININE: 1.56 mg/dL — AB (ref 0.40–1.20)
Calcium: 9.8 mg/dL (ref 8.4–10.5)
Chloride: 101 mEq/L (ref 96–112)
GFR: 41.07 mL/min — ABNORMAL LOW (ref >60.00)
GLUCOSE: 111 mg/dL — AB (ref 70–99)
Potassium: 3.9 mEq/L (ref 3.5–5.1)
Sodium: 138 mEq/L (ref 135–145)

## 2015-12-27 LAB — HEMOGLOBIN A1C: HEMOGLOBIN A1C: 6.3 % (ref 4.6–6.5)

## 2015-12-27 LAB — C-REACTIVE PROTEIN: CRP: 0.7 mg/dL (ref 0.5–20.0)

## 2015-12-27 LAB — SEDIMENTATION RATE: Sed Rate: 11 mm/hr (ref 0–30)

## 2015-12-27 MED ORDER — METHYLPREDNISOLONE ACETATE 80 MG/ML IJ SUSP
120.0000 mg | Freq: Once | INTRAMUSCULAR | Status: AC
  Administered 2015-12-27: 120 mg via INTRAMUSCULAR

## 2015-12-27 NOTE — Patient Instructions (Addendum)
Will notify you  of labs when available.   Cortisone shot today  For tight chest  .   Begin  celexa medication daily and then see what the bp is doing  And if we can decrease your medication     bp today on repeat was 140/62   Urine screen today also   [name] in 4-8 weeks regarding medication

## 2015-12-27 NOTE — Progress Notes (Signed)
Pre visit review using our clinic review tool, if applicable. No additional management support is needed unless otherwise documented below in the visit note.  Chief Complaint  Patient presents with  . Follow-up    HPI: [name] 82 y.o.  Comes in today for fu [name]  comes in today for follow up of  multiple medical problems.    Since last time  resp some better but still tight and coughing  At times  No fever   usin albuterol thinks maty be "needs a shot" pred helps some and the joint pains    bp   Ok some up . ?can she go off some of it.?   Anxiety didt start the celexa yet   Takes benzo 1/2 pill at a time  Unless more anxious .   No bleeding   ROS: See pertinent positives and negatives per HPI. Left leg ;pain back and swelling as before  Past Medical History:  Diagnosis Date  . Allergy   . Anemia   . Anxiety   . Arthritis    "shoulders" (09/09/2012)  . Asthma    " onset when young"   . Diabetes mellitus without complication ([place])    "Borderline" per pt; being monitored  . GERD (gastroesophageal reflux disease)   . Headache(784.0)    "related to my high blood pressure" (09/09/2012)  . History of DVT (deep vein thrombosis)    "RLE" (09/09/2012) coumadine cant take asa so on plavix   . Hyperlipidemia   . Hypertension   . Lupus ([place])    "cured years ago" (09/09/2012)  . Varicose veins     Family History  Problem Relation Age of Onset  . Hypertension Mother   . Hypertension Father   . Cancer Sister     Colon and Breast Cancer  . Hypertension Brother   . Heart attack Daughter   . Lung cancer      both parents     Social History   Social History  . Marital status: Single    Spouse name: N/A  . Number of children: N/A  . Years of education: N/A   Social History Main Topics  . Smoking status: Never Smoker  . Smokeless tobacco: Never Used  . Alcohol use No  . Drug use: No  . Sexual activity: Not Asked   Other Topics Concern  . None    Social History Narrative   2 people living in the home.  Grand daughter.   Up and down through the night      Had 7 children  2 deceased . 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    works 3 days per week currently .   Glasses dentures  Neg tad     Outpatient Medications Prior to Visit  Medication Sig Dispense Refill  . acetaminophen (TYLENOL) 500 MG tablet Take 1,000 mg by mouth every 6 (six) hours as needed for moderate pain. Reported on 04/30/2015    . ADVAIR DISKUS 250-50 MCG/DOSE AEPB USE 1 PUFF TWICE DAILY. 60 each 2  . albuterol (PROVENTIL) (2.5 MG/3ML) 0.083% nebulizer solution USE 1 AMPULE EVERY 6 HOURS AS NEEDED FOR WHEEZE 75 mL 1  . citalopram (CELEXA) 10 MG tablet Take 1 tablet (10 mg total) by mouth daily. For anxiety panic 30 tablet 1  . clopidogrel (PLAVIX) 75 MG tablet TAKE 1 TABLET ONCE DAILY.  30 tablet 5  . diclofenac sodium (VOLTAREN) 1 % GEL APPLY 4GM TO AFFECTED AREA(S) 4 TIMES A DAY. 100 g 1  . doxazosin (CARDURA) 2 MG tablet TAKE 3 TABLETS AT BEDTIME. 90 tablet 5  . fenofibrate 160 MG tablet TAKE 1 TABLET ONCE DAILY. 90 tablet 0  . ferrous sulfate 325 (65 FE) MG EC tablet Take 1 tablet (325 mg total) by mouth daily with breakfast. 30 tablet 3  . furosemide (LASIX) 20 MG tablet Take 1 tablet (20 mg total) by mouth 2 (two) times daily. 60 tablet 3  . guaiFENesin (MUCINEX) 600 MG 12 hr tablet Take 600 mg by mouth 2 (two) times daily as needed for cough or to loosen phlegm. Reported on 04/30/2015    . HYDROcodone-acetaminophen (NORCO/VICODIN) 5-325 MG tablet Reported on 06/29/2015    . LORazepam (ATIVAN) 1 MG tablet TAKE 1 TABLET EVERY 8 HOURS AS NEEDED FOR ANXIETY. 24 tablet 1  . metoprolol succinate (TOPROL-XL) 50 MG 24 hr tablet TAKE 1 TABLET ONCE DAILY WITH FOOD. 30 tablet 5  . montelukast (SINGULAIR) 10 MG tablet TAKE 1 TABLET ONCE DAILY. 90 tablet 3  . pantoprazole (PROTONIX) 40 MG  tablet TAKE 1 TABLET ONCE DAILY. 30 tablet 1  . potassium chloride (K-DUR) 10 MEQ tablet TAKE 4 TABLETS DAILY. 120 tablet 2  . PROAIR HFA 108 (90 Base) MCG/ACT inhaler USE 2 PUFFS EVERY 6 HOURS AS NEEDED FOR WHEEZING. 8.5 g 1  . RESTASIS 0.05 % ophthalmic emulsion     . TRIBENZOR 40-10-25 MG TABS TAKE 1 TABLET ONCE DAILY. 30 tablet 1  . vitamin B-12 (CYANOCOBALAMIN) 1000 MCG tablet Take 1,000 mcg by mouth daily.    [name] doxycycline (VIBRA-TABS) 100 MG tablet Take 1 tablet (100 mg total) by mouth 2 (two) times daily. 14 tablet 0  . predniSONE (DELTASONE) 20 MG tablet Take 3 po qd for 2 days then 2 po qd for 3 days,or as directed 12 tablet 0   No facility-administered medications prior to visit.      EXAM:  BP 138/62   Pulse 66   Temp 98.3 F (36.8 C) (Oral)   Ht 5\' 1"  (1.549 m)   Wt 185 lb 12.8 oz (84.3 kg)   SpO2 98%   BMI 35.11 kg/m   Body mass index is 35.11 kg/m.  GENERAL: vitals reviewed and listed above, alert, oriented, appears well hydrated and in no acute distress mildly hoarse  No stridor   HEENT: atraumatic, conjunctiva  clear, no obvious abnormalities on inspection of external nose and ears OP : no lesion edema or exudate  NECK: no obvious masses on inspection palpation  LUNGS:  Dec air movement   Exp ? Wheeze no rales or rhonchi   CV: HRRR, no clubbing cyanosis 1+  peripheral edema nl cap refill  MS: moves all extremities without noticeable focal  Abnormality stiffness  PSYCH: pleasant and cooperative,  Wt Readings from Last 3 Encounters:  12/27/15 185 lb 12.8 oz (84.3 kg)  12/10/15 183 lb 6.4 oz (83.2 kg)  09/13/15 187 lb 4.8 oz (85 kg)   BP Readings from Last 3 Encounters:  12/27/15 138/62  12/10/15 (!) 142/62  09/13/15 (!) 156/60   Lab Results  Component Value Date   WBC 5.4 08/16/2015   HGB 10.4 (L) 08/16/2015   HCT 31.3 (L) 08/16/2015   PLT 194 08/16/2015   GLUCOSE 102 (H) 10/04/2015   CHOL 133 09/14/2014   TRIG 84.0 09/14/2014   HDL 46.80  09/14/2014  LDLCALC 69 09/14/2014   ALT 19 08/16/2015   AST 28 08/16/2015   NA 129 (L) 10/04/2015   K 4.1 10/04/2015   CL 95 (L) 10/04/2015   CREATININE 1.21 (H) 10/04/2015   BUN 20 10/04/2015   CO2 28 10/04/2015   TSH 1.99 04/27/2014   INR 0.94 12/22/2009   HGBA1C 5.9 03/01/2015    ASSESSMENT AND PLAN:  Discussed the following assessment and plan:  Dyspnea - Plan: CBC with Differential/Platelet, Basic metabolic panel, Hemoglobin A1c, C-reactive protein, Sedimentation rate, TSH, methylPREDNISolone acetate (DEPO-MEDROL) injection 120 mg  Medication management - Plan: CBC with Differential/Platelet, Basic metabolic panel, Hemoglobin A1c, C-reactive protein, Sedimentation rate, TSH  Essential hypertension - Plan: CBC with Differential/Platelet, Basic metabolic panel, Hemoglobin A1c, C-reactive protein, Sedimentation rate, TSH  Renal insufficiency creatinine 1.3 - Plan: CBC with Differential/Platelet, Basic metabolic panel, Hemoglobin A1c, C-reactive protein, Sedimentation rate, TSH  Fasting hyperglycemia - Plan: CBC with Differential/Platelet, Basic metabolic panel, Hemoglobin A1c, C-reactive protein, Sedimentation rate, TSH  Anemia of chronic disease - Plan: CBC with Differential/Platelet, Basic metabolic panel, Hemoglobin A1c, C-reactive protein, Sedimentation rate, TSH  Asthma, chronic, unspecified asthma severity, with acute exacerbation - depromedrol today - Plan: CBC with Differential/Platelet, Basic metabolic panel, Hemoglobin A1c, C-reactive protein, Sedimentation rate, TSH, methylPREDNISolone acetate (DEPO-MEDROL) injection 120 mg  Anxiety state - hast started the celexa yet  - Plan: CBC with Differential/Platelet, Basic metabolic panel, Hemoglobin A1c, C-reactive protein, Sedimentation rate, TSH Total visit 77mins > 50% spent counseling and coordinating care as indicated in above note and in instructions to patient .     -Patient advised to return or notify health care  team  if symptoms worsen ,persist or new concerns arise.  Patient Instructions  Will notify you  of labs when available.   Cortisone shot today  For tight chest  .   Begin  celexa medication daily and then see what the bp is doing  And if we can decrease your medication     bp today on repeat was 140/62   Urine screen today also   [name] in 4-8 weeks regarding medication

## 2016-01-04 ENCOUNTER — Other Ambulatory Visit: Payer: Self-pay | Admitting: Internal Medicine

## 2016-01-05 ENCOUNTER — Other Ambulatory Visit: Payer: Self-pay | Admitting: Internal Medicine

## 2016-01-05 NOTE — Telephone Encounter (Signed)
Sent to the pharmacy by e-scribe for 4 months per [facility].

## 2016-01-06 ENCOUNTER — Other Ambulatory Visit: Payer: Self-pay | Admitting: Internal Medicine

## 2016-01-06 NOTE — Telephone Encounter (Signed)
I sent in refills.

## 2016-01-07 ENCOUNTER — Telehealth: Payer: Self-pay

## 2016-01-07 DIAGNOSIS — H01003 Unspecified blepharitis right eye, unspecified eyelid: Secondary | ICD-10-CM | POA: Diagnosis not present

## 2016-01-07 DIAGNOSIS — H04123 Dry eye syndrome of bilateral lacrimal glands: Secondary | ICD-10-CM | POA: Diagnosis not present

## 2016-01-07 NOTE — Telephone Encounter (Signed)
Ok to refill x 1  

## 2016-01-07 NOTE — Telephone Encounter (Signed)
Spoke to patient. Gave lab results and instructions. Patient verbalized understanding. Patient c/o lightheadedness, and N&V x2 days. No diarrhea. Advised would notify Dr. Regis Bill. Patient agreed.

## 2016-01-07 NOTE — Telephone Encounter (Signed)
Called patient to give lab results. No answer.  Will try later.

## 2016-01-07 NOTE — Telephone Encounter (Signed)
Called to the pharmacy and left on machine. 

## 2016-01-07 NOTE — Telephone Encounter (Signed)
-----   Message from Burnis Medin, MD sent at 01/05/2016  5:38 PM EDT ----- Blood work still mildly anemia  Thyroid normal  Renal function   A little worse but within same  Range   [name] in   As planned  In about 6-8 weeks  Plan repeat bmp in 4-6 months or as needed

## 2016-01-07 NOTE — Telephone Encounter (Signed)
Called patient.  No answer.

## 2016-01-07 NOTE — Telephone Encounter (Signed)
Advise clinical triage  For the    NV   And or ov when appropriate.

## 2016-01-10 NOTE — Telephone Encounter (Signed)
Left a message for a return call.

## 2016-01-11 NOTE — Telephone Encounter (Signed)
Left a message for a return call.

## 2016-01-12 NOTE — Telephone Encounter (Signed)
Left a message for a return call.

## 2016-01-18 ENCOUNTER — Encounter: Payer: Self-pay | Admitting: Family Medicine

## 2016-01-18 NOTE — Telephone Encounter (Signed)
Left a message for a return call.  Have made multiple attempts to reach the pt.  Will now close the note. 

## 2016-02-03 DIAGNOSIS — M545 Low back pain: Secondary | ICD-10-CM | POA: Diagnosis not present

## 2016-02-03 DIAGNOSIS — M4316 Spondylolisthesis, lumbar region: Secondary | ICD-10-CM | POA: Diagnosis not present

## 2016-02-03 DIAGNOSIS — M5415 Radiculopathy, thoracolumbar region: Secondary | ICD-10-CM | POA: Diagnosis not present

## 2016-02-08 ENCOUNTER — Ambulatory Visit: Payer: Medicare Other | Admitting: Internal Medicine

## 2016-02-11 ENCOUNTER — Ambulatory Visit: Payer: Medicare Other | Admitting: Internal Medicine

## 2016-02-13 ENCOUNTER — Other Ambulatory Visit: Payer: Self-pay | Admitting: Internal Medicine

## 2016-02-18 DIAGNOSIS — H40023 Open angle with borderline findings, high risk, bilateral: Secondary | ICD-10-CM | POA: Diagnosis not present

## 2016-02-18 DIAGNOSIS — H04203 Unspecified epiphora, bilateral lacrimal glands: Secondary | ICD-10-CM | POA: Diagnosis not present

## 2016-02-18 DIAGNOSIS — H04123 Dry eye syndrome of bilateral lacrimal glands: Secondary | ICD-10-CM | POA: Diagnosis not present

## 2016-02-21 ENCOUNTER — Encounter: Payer: Self-pay | Admitting: Internal Medicine

## 2016-02-21 ENCOUNTER — Ambulatory Visit (INDEPENDENT_AMBULATORY_CARE_PROVIDER_SITE_OTHER): Payer: Medicare Other | Admitting: Internal Medicine

## 2016-02-21 VITALS — BP 148/70 | HR 63 | Temp 98.6°F | Ht 61.0 in | Wt 179.2 lb

## 2016-02-21 DIAGNOSIS — T887XXA Unspecified adverse effect of drug or medicament, initial encounter: Secondary | ICD-10-CM

## 2016-02-21 DIAGNOSIS — I10 Essential (primary) hypertension: Secondary | ICD-10-CM | POA: Diagnosis not present

## 2016-02-21 DIAGNOSIS — Z79899 Other long term (current) drug therapy: Secondary | ICD-10-CM | POA: Diagnosis not present

## 2016-02-21 DIAGNOSIS — Z23 Encounter for immunization: Secondary | ICD-10-CM

## 2016-02-21 DIAGNOSIS — F411 Generalized anxiety disorder: Secondary | ICD-10-CM | POA: Diagnosis not present

## 2016-02-21 DIAGNOSIS — T50905A Adverse effect of unspecified drugs, medicaments and biological substances, initial encounter: Secondary | ICD-10-CM

## 2016-02-21 DIAGNOSIS — N289 Disorder of kidney and ureter, unspecified: Secondary | ICD-10-CM

## 2016-02-21 MED ORDER — CITALOPRAM HYDROBROMIDE 10 MG PO TABS: 15.0000 mg | ORAL_TABLET | Freq: Every day | ORAL | 1 refills | Status: DC

## 2016-02-21 NOTE — Progress Notes (Signed)
Chief Complaint  Patient presents with  . Follow-up    HPI: [name] 82 y.o.  Comes in for [name]  comes in today for follow up of  multiple medical problems.  Since last visit we  Added celexa for control of anxiety at 10 mg  Says helps some in day ut in evening feels anxious.    Breathing ok if not getting a cold infection. Still would  ike to get off or dec bp meds . Can she take magnesium for leg cramps ?  Swelling ok  Topical nsaid helping pain but   Getting itch   With it  ? Locally and may be coming allergic to it.   Seeing hematology  ? Other  Would really like to get off of some bp medication( some rx per cards)  ROS: See pertinent positives and negatives per HPI. No fever hemoptysis falling  Here with family member today   Past Medical History:  Diagnosis Date  . Allergy   . Anemia   . Anxiety   . Arthritis    "shoulders" (09/09/2012)  . Asthma    " onset when young"   . Diabetes mellitus without complication ([place])    "Borderline" per pt; being monitored  . GERD (gastroesophageal reflux disease)   . Headache(784.0)    "related to my high blood pressure" (09/09/2012)  . History of DVT (deep vein thrombosis)    "RLE" (09/09/2012) coumadine cant take asa so on plavix   . Hyperlipidemia   . Hypertension   . Lupus    "cured years ago" (09/09/2012)  . Varicose veins     Family History  Problem Relation Age of Onset  . Hypertension Mother   . Hypertension Father   . Cancer Sister     Colon and Breast Cancer  . Hypertension Brother   . Heart attack Daughter   . Lung cancer      both parents     Social History   Social History  . Marital status: Single    Spouse name: N/A  . Number of children: N/A  . Years of education: N/A   Social History Main Topics  . Smoking status: Never Smoker  . Smokeless tobacco: Never Used  . Alcohol use No  . Drug use: No  . Sexual activity: Not Asked   Other Topics Concern  . None   Social History  Narrative   2 people living in the home.  Grand daughter.   Up and down through the night      Had 7 children  2 deceased . 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    works 3 days per week currently .   Glasses dentures  Neg tad     Outpatient Medications Prior to Visit  Medication Sig Dispense Refill  . acetaminophen (TYLENOL) 500 MG tablet Take 1,000 mg by mouth every 6 (six) hours as needed for moderate pain. Reported on 04/30/2015    . ADVAIR DISKUS 250-50 MCG/DOSE AEPB USE 1 PUFF TWICE DAILY. 60 each 3  . albuterol (PROVENTIL) (2.5 MG/3ML) 0.083% nebulizer solution USE 1 AMPULE EVERY 6 HOURS AS NEEDED FOR WHEEZE 75 mL 1  . clopidogrel (PLAVIX) 75 MG tablet TAKE 1 TABLET ONCE DAILY. 30 tablet 5  . diclofenac sodium (VOLTAREN) 1 % GEL APPLY 4GM TO AFFECTED AREA(S) 4 TIMES A DAY. 100 g  1  . doxazosin (CARDURA) 2 MG tablet TAKE 3 TABLETS AT BEDTIME. 90 tablet 5  . ferrous sulfate 325 (65 FE) MG EC tablet Take 1 tablet (325 mg total) by mouth daily with breakfast. 30 tablet 3  . furosemide (LASIX) 20 MG tablet Take 1 tablet (20 mg total) by mouth 2 (two) times daily. 60 tablet 3  . guaiFENesin (MUCINEX) 600 MG 12 hr tablet Take 600 mg by mouth 2 (two) times daily as needed for cough or to loosen phlegm. Reported on 04/30/2015    . HYDROcodone-acetaminophen (NORCO/VICODIN) 5-325 MG tablet Reported on 06/29/2015    . metoprolol succinate (TOPROL-XL) 50 MG 24 hr tablet TAKE 1 TABLET ONCE DAILY WITH FOOD. 30 tablet 5  . montelukast (SINGULAIR) 10 MG tablet TAKE 1 TABLET ONCE DAILY. 90 tablet 3  . pantoprazole (PROTONIX) 40 MG tablet TAKE 1 TABLET ONCE DAILY. 30 tablet 1  . potassium chloride (K-DUR) 10 MEQ tablet TAKE 4 TABLETS DAILY. 120 tablet 3  . PROAIR HFA 108 (90 Base) MCG/ACT inhaler USE 2 PUFFS EVERY 6 HOURS AS NEEDED FOR WHEEZING. 8.5 g 2  . RESTASIS 0.05 % ophthalmic emulsion     . TRIBENZOR 40-10-25 MG TABS  TAKE 1 TABLET ONCE DAILY. 30 tablet 1  . vitamin B-12 (CYANOCOBALAMIN) 1000 MCG tablet Take 1,000 mcg by mouth daily.    . citalopram (CELEXA) 10 MG tablet Take 1 tablet (10 mg total) by mouth daily. For anxiety panic 30 tablet 1  . fenofibrate 160 MG tablet TAKE 1 TABLET ONCE DAILY. 90 tablet 0  . LORazepam (ATIVAN) 1 MG tablet TAKE 1 TABLET EVERY 8 HOURS AS NEEDED FOR ANXIETY. 24 tablet 0   No facility-administered medications prior to visit.      EXAM:  BP (!) 148/70 (BP Location: Left Arm, Patient Position: Sitting, Cuff Size: Normal)   Pulse 63   Temp 98.6 F (37 C) (Oral)   Ht 5\' 1"  (1.549 m)   Wt 179 lb 3.2 oz (81.3 kg)   SpO2 99%   BMI 33.86 kg/m   Body mass index is 33.86 kg/m.  GENERAL: vitals reviewed and listed above, alert, oriented, appears well hydrated and in no acute distress min hhoarse  No cough  HEENT: atraumatic, conjunctiva  clear, no obvious abnormalities on inspection of external nose and ears  NECK: no obvious masses on inspection palpation  LUNGS: clear to auscultation bilaterally, no wheezes, rales or rhonchi,  CV: HRRR, no clubbing cyanosis or 2+ peripheral edema nl cap refill  MS: moves all extremities without noticeable focal  abnormality PSYCH: pleasant and cooperative, no obvious depression seems less anxious  anxiety Lab Results  Component Value Date   WBC 3.9 (L) 12/27/2015   HGB 10.6 (L) 12/27/2015   HCT 31.9 (L) 12/27/2015   PLT 180.0 12/27/2015   GLUCOSE 111 (H) 12/27/2015   CHOL 133 09/14/2014   TRIG 84.0 09/14/2014   HDL 46.80 09/14/2014   LDLCALC 69 09/14/2014   ALT 19 08/16/2015   AST 28 08/16/2015   NA 138 12/27/2015   K 3.9 12/27/2015   CL 101 12/27/2015   CREATININE 1.56 (H) 12/27/2015   BUN 29 (H) 12/27/2015   CO2 28 12/27/2015   TSH 2.29 12/27/2015   INR 0.94 12/22/2009   HGBA1C 6.3 12/27/2015   Wt Readings from Last 3 Encounters:  02/21/16 179 lb 3.2 oz (81.3 kg)  12/27/15 185 lb 12.8 oz (84.3 kg)  12/10/15 183  lb 6.4 oz (83.2 kg)  ASSESSMENT AND PLAN:  Discussed the following assessment and plan:  Anxiety state - some response try inc to 15 mg   plan bmp in 2 month or as needed fu 2 months  Need for prophylactic vaccination and inoculation against influenza - Plan: Flu vaccine HIGH DOSE PF (Fluzone High dose)  Essential hypertension  Medication management  Renal insufficiency creatinine 1.3 1.5 - consdieration of renal evalconsult to look for optimization of meds etc. will reveiw record  Need for vaccination with 13-polyvalent pneumococcal conjugate vaccine - Plan: Pneumococcal conjugate vaccine 13-valent  Adverse effect of drug, initial encounter - ? local itch with topical voltaren that helps her pain  but seems to be related to local itching . consider trial solution Review record  About   bp renal etc consideration of nephrology opinion   Management  -Patient advised to return or notify health care team  if symptoms worsen ,persist or new concerns arise. prevnar today  Patient Instructions  Increase the citalopram to 15 mg a day or 1-1/2 pills. Prevnar 13 pneumonia shots today. Not sure if there are other other alternatives to the topical pain medicine but will look into that. Your blood pressures acceptable could be better may come down with better anxiety treatment.  Would like you to come back in another 6-8 weeks or 2 months. Contact us in the meantime if things aren't working out. Your lungs are clear today. Okay to take a low-dose magnesium over-the-counter to see if it helps her leg cramps. We should check a chemistry panel I and a1c and magnesium before next visit    Wanda K. Panosh M.D. BP Readings from Last 3 Encounters:  02/21/16 (!) 148/70  12/27/15 138/62  12/10/15 (!) 142/62  revewed  or record  gfr   isa bout the same for the last 3 years .  No recent  imaging of kidneys( ok ct 2004)  and no  Urine eval for years.  Will plan to add urine protein creatinine  ratio with Urinalysis  At  Her lab in a month  And  Check renal ultrasound.

## 2016-02-21 NOTE — Progress Notes (Signed)
Pre visit review using our clinic review tool, if applicable. No additional management support is needed unless otherwise documented below in the visit note. 

## 2016-02-21 NOTE — Patient Instructions (Addendum)
Increase the citalopram to 15 mg a day or 1-1/2 pills. Prevnar 13 pneumonia shots today. Not sure if there are other other alternatives to the topical pain medicine but will look into that. Your blood pressures acceptable could be better may come down with better anxiety treatment.  Would like you to come back in another 6-8 weeks or 2 months. Contact us in the meantime if things aren't working out. Your lungs are clear today. Okay to take a low-dose magnesium over-the-counter to see if it helps her leg cramps. We should check a chemistry panel I and a1c and magnesium before next visit

## 2016-02-23 ENCOUNTER — Other Ambulatory Visit: Payer: Self-pay | Admitting: Family Medicine

## 2016-02-23 DIAGNOSIS — R252 Cramp and spasm: Secondary | ICD-10-CM

## 2016-02-23 DIAGNOSIS — O24919 Unspecified diabetes mellitus in pregnancy, unspecified trimester: Secondary | ICD-10-CM

## 2016-02-23 DIAGNOSIS — I10 Essential (primary) hypertension: Secondary | ICD-10-CM

## 2016-02-23 DIAGNOSIS — N289 Disorder of kidney and ureter, unspecified: Secondary | ICD-10-CM

## 2016-02-23 DIAGNOSIS — E785 Hyperlipidemia, unspecified: Secondary | ICD-10-CM

## 2016-02-23 DIAGNOSIS — R7303 Prediabetes: Secondary | ICD-10-CM

## 2016-02-23 NOTE — Telephone Encounter (Signed)
Lorazepam called to the pharmacy.  Fenofibrate sent electronically.  Lab orders placed.

## 2016-02-23 NOTE — Telephone Encounter (Signed)
Ok to refill ativan x 1 and fenfibrate   X 6 months   Misty pleas add on  Lipid panel to her next labs because ot dont for a year  thanks

## 2016-02-24 ENCOUNTER — Other Ambulatory Visit: Payer: Self-pay | Admitting: Internal Medicine

## 2016-02-28 ENCOUNTER — Telehealth: Payer: Self-pay | Admitting: Family Medicine

## 2016-02-28 NOTE — Telephone Encounter (Signed)
Angela Medin, MD sent to [name] [place] please add on urine microalb creatinine ratio and UA to next months labs dx renal CKD .  Also inform pt and please schedule Renal ultrasound to look at kidney size  As we talked about looking at kidney function status.  Ask her if she wants to try solution of topical diclofenac instead of gel To see if she still itches. But helps her arthritis pain .  Thanks

## 2016-02-29 ENCOUNTER — Other Ambulatory Visit: Payer: Self-pay | Admitting: Family Medicine

## 2016-02-29 DIAGNOSIS — R7989 Other specified abnormal findings of blood chemistry: Secondary | ICD-10-CM

## 2016-02-29 DIAGNOSIS — N289 Disorder of kidney and ureter, unspecified: Secondary | ICD-10-CM

## 2016-02-29 NOTE — Telephone Encounter (Signed)
Orders for lab and ultrasound placed in the system.  Left a message for the pt to return my call.

## 2016-02-29 NOTE — Telephone Encounter (Signed)
Sent to the pharmacy by e-scribe for 3 months. Pt has follow up scheduled 05/05/16

## 2016-03-02 NOTE — Telephone Encounter (Signed)
Left a message for a return call.

## 2016-03-02 NOTE — Telephone Encounter (Signed)
Misty pt returning your call would like to have a call back this morning if possible.

## 2016-03-07 ENCOUNTER — Other Ambulatory Visit: Payer: Self-pay | Admitting: Internal Medicine

## 2016-03-08 NOTE — Telephone Encounter (Signed)
Left a message for a return call.

## 2016-03-08 NOTE — Telephone Encounter (Signed)
Sent to the pharmacy by e-scribe for 3 months.

## 2016-03-09 NOTE — Telephone Encounter (Signed)
Left a message for a return call.  Have tried multiple times to reach the pt.  Will now close the note.

## 2016-03-13 NOTE — Telephone Encounter (Signed)
Pt is return misty call °

## 2016-03-15 ENCOUNTER — Other Ambulatory Visit: Payer: Self-pay | Admitting: Internal Medicine

## 2016-03-16 ENCOUNTER — Telehealth: Payer: Self-pay | Admitting: Internal Medicine

## 2016-03-16 NOTE — Telephone Encounter (Signed)
Pt request refill  doxazosin (CARDURA) 2 MG tablet  Pt does want to try the topical diclofenac diclofenac sodium (VOLTAREN)  [place] aware Rojelio Brenner will return call in the am if possible.

## 2016-03-17 NOTE — Telephone Encounter (Signed)
Sent to the pharmacy by e-scribe. 

## 2016-03-20 ENCOUNTER — Ambulatory Visit
Admission: RE | Admit: 2016-03-20 | Discharge: 2016-03-20 | Disposition: A | Discharge status: Home / Self Care | Payer: Medicare Other | Source: Ambulatory Visit | Attending: Internal Medicine | Admitting: Internal Medicine

## 2016-03-20 DIAGNOSIS — N289 Disorder of kidney and ureter, unspecified: Secondary | ICD-10-CM

## 2016-03-20 DIAGNOSIS — R7989 Other specified abnormal findings of blood chemistry: Secondary | ICD-10-CM

## 2016-03-20 DIAGNOSIS — N189 Chronic kidney disease, unspecified: Secondary | ICD-10-CM | POA: Diagnosis not present

## 2016-03-27 ENCOUNTER — Ambulatory Visit (INDEPENDENT_AMBULATORY_CARE_PROVIDER_SITE_OTHER): Payer: Medicare Other | Admitting: Internal Medicine

## 2016-03-27 ENCOUNTER — Telehealth: Payer: Self-pay | Admitting: Internal Medicine

## 2016-03-27 ENCOUNTER — Other Ambulatory Visit: Payer: Self-pay | Admitting: Internal Medicine

## 2016-03-27 ENCOUNTER — Encounter: Payer: Self-pay | Admitting: Internal Medicine

## 2016-03-27 ENCOUNTER — Other Ambulatory Visit: Payer: Self-pay | Admitting: Family Medicine

## 2016-03-27 VITALS — BP 168/52 | HR 76 | Temp 98.4°F | Ht 61.0 in | Wt 185.4 lb

## 2016-03-27 DIAGNOSIS — Z880 Allergy status to penicillin: Secondary | ICD-10-CM

## 2016-03-27 DIAGNOSIS — N289 Disorder of kidney and ureter, unspecified: Secondary | ICD-10-CM

## 2016-03-27 DIAGNOSIS — J45901 Unspecified asthma with (acute) exacerbation: Secondary | ICD-10-CM

## 2016-03-27 DIAGNOSIS — J019 Acute sinusitis, unspecified: Secondary | ICD-10-CM | POA: Diagnosis not present

## 2016-03-27 DIAGNOSIS — N281 Cyst of kidney, acquired: Secondary | ICD-10-CM

## 2016-03-27 MED ORDER — AZITHROMYCIN 250 MG PO TABS: ORAL_TABLET | ORAL | 0 refills | Status: DC

## 2016-03-27 MED ORDER — METHYLPREDNISOLONE ACETATE 80 MG/ML IJ SUSP
80.0000 mg | Freq: Once | INTRAMUSCULAR | Status: AC
  Administered 2016-03-27: 80 mg via INTRAMUSCULAR

## 2016-03-27 MED ORDER — IPRATROPIUM-ALBUTEROL 0.5-2.5 (3) MG/3ML IN SOLN
3.0000 mL | Freq: Once | RESPIRATORY_TRACT | Status: AC
  Administered 2016-03-27: 3 mL via RESPIRATORY_TRACT

## 2016-03-27 NOTE — Patient Instructions (Addendum)
Year wheezing may be aggravated by a sinus infection. We'll try antibiotic and cortisone  Injection again. If you are not improving with may get your long doctor to see you.   Will be contracted about  referral opinion to nephrology .    I fnot betting better may need you to see your pulmonary specialist.

## 2016-03-27 NOTE — Telephone Encounter (Signed)
Sent to the pharmacy by e-scribe for 6 months per [facility].  Pt seen today in [place].

## 2016-03-27 NOTE — Telephone Encounter (Signed)
Error/ltd ° °

## 2016-03-27 NOTE — Progress Notes (Signed)
Chief Complaint  Patient presents with  . Cough    x1WK. Productive.    HPI: [name] 82 y.o. comes in today for increasing cough and yellow phlegm over the last week with some sinus pressure and discomfort. No associated fever but some shortness of breath. She is to use albuterol with some nebulizer last 20 4 AM because she was short of breath. She also would like a refill of her Lasix. No hemoptysis. She was last treated with Depo-Medrol doxycycline 3 months ago for prolonged acute exacerbation of her respiratory wheezing. ROS: See pertinent positives and negatives per HPI. No cp  Had [place] for renal insufficiency   Past Medical History:  Diagnosis Date  . Allergy   . Anemia   . Anxiety   . Arthritis    "shoulders" (09/09/2012)  . Asthma    " onset when young"   . Diabetes mellitus without complication ([place])    "Borderline" per pt; being monitored  . GERD (gastroesophageal reflux disease)   . Headache(784.0)    "related to my high blood pressure" (09/09/2012)  . History of DVT (deep vein thrombosis)    "RLE" (09/09/2012) coumadine cant take asa so on plavix   . Hyperlipidemia   . Hypertension   . Lupus    "cured years ago" (09/09/2012)  . Varicose veins     Family History  Problem Relation Age of Onset  . Hypertension Mother   . Hypertension Father   . Cancer Sister     Colon and Breast Cancer  . Hypertension Brother   . Heart attack Daughter   . Lung cancer      both parents     Social History   Social History  . Marital status: Single    Spouse name: N/A  . Number of children: N/A  . Years of education: N/A   Social History Main Topics  . Smoking status: Never Smoker  . Smokeless tobacco: Never Used  . Alcohol use No  . Drug use: No  . Sexual activity: Not Asked   Other Topics Concern  . None   Social History Narrative   2 people living in the home.  Grand daughter.   Up and down through the night      Had 7 children  2 deceased . 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    works 3 days per week currently .   Glasses dentures  Neg tad     Outpatient Medications Prior to Visit  Medication Sig Dispense Refill  . acetaminophen (TYLENOL) 500 MG tablet Take 1,000 mg by mouth every 6 (six) hours as needed for moderate pain. Reported on 04/30/2015    . ADVAIR DISKUS 250-50 MCG/DOSE AEPB USE 1 PUFF TWICE DAILY. 60 each 3  . albuterol (PROVENTIL) (2.5 MG/3ML) 0.083% nebulizer solution USE 1 AMPULE EVERY 6 HOURS AS NEEDED FOR WHEEZE 75 mL 1  . citalopram (CELEXA) 10 MG tablet Take 1.5 tablets (15 mg total) by mouth daily. For anxiety panic 45 tablet 1  . clopidogrel (PLAVIX) 75 MG tablet TAKE 1 TABLET ONCE DAILY. 30 tablet 5  . diclofenac sodium (VOLTAREN) 1 % GEL APPLY 4GM TO AFFECTED AREA(S) 4 TIMES A DAY. 100 g 1  . doxazosin (CARDURA) 2 MG tablet TAKE 3 TABLETS AT BEDTIME. 90 tablet 5  . fenofibrate 160 MG tablet TAKE 1 TABLET ONCE DAILY.  90 tablet 1  . ferrous sulfate 325 (65 FE) MG EC tablet Take 1 tablet (325 mg total) by mouth daily with breakfast. 30 tablet 3  . furosemide (LASIX) 20 MG tablet Take 1 tablet (20 mg total) by mouth 2 (two) times daily. 60 tablet 3  . guaiFENesin (MUCINEX) 600 MG 12 hr tablet Take 600 mg by mouth 2 (two) times daily as needed for cough or to loosen phlegm. Reported on 04/30/2015    . LORazepam (ATIVAN) 1 MG tablet TAKE 1 TABLET EVERY 8 HOURS AS NEEDED FOR ANXIETY. 24 tablet 0  . metoprolol succinate (TOPROL-XL) 50 MG 24 hr tablet TAKE 1 TABLET ONCE DAILY WITH FOOD. 30 tablet 2  . montelukast (SINGULAIR) 10 MG tablet TAKE 1 TABLET ONCE DAILY. 90 tablet 3  . pantoprazole (PROTONIX) 40 MG tablet TAKE 1 TABLET ONCE DAILY. 30 tablet 2  . potassium chloride (K-DUR) 10 MEQ tablet TAKE 4 TABLETS DAILY. 120 tablet 3  . PROAIR HFA 108 (90 Base) MCG/ACT inhaler USE 2 PUFFS EVERY 6 HOURS AS NEEDED FOR WHEEZING. 8.5 g 2  . RESTASIS  0.05 % ophthalmic emulsion     . TRIBENZOR 40-10-25 MG TABS TAKE 1 TABLET ONCE DAILY. 30 tablet 2  . vitamin B-12 (CYANOCOBALAMIN) 1000 MCG tablet Take 1,000 mcg by mouth daily.    [name] HYDROcodone-acetaminophen (NORCO/VICODIN) 5-325 MG tablet Reported on 06/29/2015     No facility-administered medications prior to visit.      EXAM:  BP (!) 168/52 (BP Location: Right Arm, Patient Position: Sitting, Cuff Size: Normal)   Pulse 76   Temp 98.4 F (36.9 C) (Oral)   Ht 5\' 1"  (1.549 m)   Wt 185 lb 6.4 oz (84.1 kg)   SpO2 99%   BMI 35.03 kg/m   Body mass index is 35.03 kg/m.  GENERAL: vitals reviewed and listed above, alert, oriented, appears well hydrated and in no acute distress she is mildly hoarse nontoxic at rest unlabored slightly breathless. HEENT: atraumatic, conjunctiva  clear, no obvious abnormalities on inspection of external nose and ears TMs are clear face is mildly tender left more than right. OP : no lesion edema or exudate  NECK: no obvious masses on inspection palpation  LUNGS: Decreased breath sounds bilaterally with expiratory wheezing. No rales are heard. After nebulizer of DuoNeb increased breath sounds in 10 you mild wheezing. Better air movement. Cough is looser.CV: HRRR, no clubbing cyanosis 1+ peripheral edema nl cap refill  MS: moves all extremities without noticeable focal  abnormality PSYCH: pleasant and cooperative, no obvious depression or anxiety  ASSESSMENT AND PLAN:  Discussed the following assessment and plan:  Asthma, chronic, unspecified asthma severity, with acute exacerbation - Plan: methylPREDNISolone acetate (DEPO-MEDROL) injection 80 mg, ipratropium-albuterol (DUONEB) 0.5-2.5 (3) MG/3ML nebulizer solution 3 mL  Acute sinusitis, recurrence not specified, unspecified location - Plan: methylPREDNISolone acetate (DEPO-MEDROL) injection 80 mg, ipratropium-albuterol (DUONEB) 0.5-2.5 (3) MG/3ML nebulizer solution 3 mL  H/O allergy to penicillin  Renal  insufficiency creatinine 1.3  Renal cyst Acute wheezing with history of same acute exacerbation possibly infection triggered she is allergic to penicillin thus limiting options last was on Doxy will give azithromycin plus Depo-Medrol today expectant management. In the office it appears that she got some relief with nebulizer today and her exam improved. We'll proceed with a referral to nephrology in regard to renal insufficiency and her ultrasound which we reviewed today. -Patient advised to return or notify health care team  if symptoms worsen ,persist or new  concerns arise. Lab Results  Component Value Date   WBC 3.9 (L) 12/27/2015   HGB 10.6 (L) 12/27/2015   HCT 31.9 (L) 12/27/2015   PLT 180.0 12/27/2015   GLUCOSE 111 (H) 12/27/2015   CHOL 133 09/14/2014   TRIG 84.0 09/14/2014   HDL 46.80 09/14/2014   LDLCALC 69 09/14/2014   ALT 19 08/16/2015   AST 28 08/16/2015   NA 138 12/27/2015   K 3.9 12/27/2015   CL 101 12/27/2015   CREATININE 1.56 (H) 12/27/2015   BUN 29 (H) 12/27/2015   CO2 28 12/27/2015   TSH 2.29 12/27/2015   INR 0.94 12/22/2009   HGBA1C 6.3 12/27/2015   Refill potassium   Patient Instructions  Year wheezing may be aggravated by a sinus infection. We'll try antibiotic and cortisone  Injection again. If you are not improving with may get your long doctor to see you.   Will be contracted about  referral opinion to nephrology .    I fnot betting better may need you to see your pulmonary specialist.     Standley Brooking. Panosh M.D.

## 2016-03-27 NOTE — Progress Notes (Signed)
Pre visit review using our clinic review tool, if applicable. No additional management support is needed unless otherwise documented below in the visit note. 

## 2016-04-14 DIAGNOSIS — Z8739 Personal history of other diseases of the musculoskeletal system and connective tissue: Secondary | ICD-10-CM | POA: Diagnosis not present

## 2016-04-14 DIAGNOSIS — I129 Hypertensive chronic kidney disease with stage 1 through stage 4 chronic kidney disease, or unspecified chronic kidney disease: Secondary | ICD-10-CM | POA: Diagnosis not present

## 2016-04-14 DIAGNOSIS — D631 Anemia in chronic kidney disease: Secondary | ICD-10-CM | POA: Diagnosis not present

## 2016-04-14 DIAGNOSIS — N183 Chronic kidney disease, stage 3 (moderate): Secondary | ICD-10-CM | POA: Diagnosis not present

## 2016-04-20 ENCOUNTER — Other Ambulatory Visit: Payer: Self-pay | Admitting: Internal Medicine

## 2016-04-24 ENCOUNTER — Other Ambulatory Visit: Payer: Medicare Other

## 2016-04-24 NOTE — Telephone Encounter (Signed)
Sent to the pharmacy by e-scribe for 4 months.  Pt seen for anxiety on 02/21/2016.

## 2016-04-25 ENCOUNTER — Other Ambulatory Visit (INDEPENDENT_AMBULATORY_CARE_PROVIDER_SITE_OTHER): Payer: Medicare Other

## 2016-04-25 DIAGNOSIS — R252 Cramp and spasm: Secondary | ICD-10-CM | POA: Diagnosis not present

## 2016-04-25 DIAGNOSIS — N289 Disorder of kidney and ureter, unspecified: Secondary | ICD-10-CM

## 2016-04-25 DIAGNOSIS — R7303 Prediabetes: Secondary | ICD-10-CM

## 2016-04-25 DIAGNOSIS — E785 Hyperlipidemia, unspecified: Secondary | ICD-10-CM | POA: Diagnosis not present

## 2016-04-25 DIAGNOSIS — I10 Essential (primary) hypertension: Secondary | ICD-10-CM | POA: Diagnosis not present

## 2016-04-25 LAB — BASIC METABOLIC PANEL
BUN: 25 mg/dL — AB (ref 6–23)
CHLORIDE: 99 meq/L (ref 96–112)
CO2: 29 mEq/L (ref 19–32)
Calcium: 9.9 mg/dL (ref 8.4–10.5)
Creatinine, Ser: 1.34 mg/dL — ABNORMAL HIGH (ref 0.40–1.20)
GFR: 48.91 mL/min — AB (ref >60.00)
Glucose, Bld: 132 mg/dL — ABNORMAL HIGH (ref 70–99)
POTASSIUM: 3.8 meq/L (ref 3.5–5.1)
SODIUM: 137 meq/L (ref 135–145)

## 2016-04-25 LAB — URINALYSIS, ROUTINE W REFLEX MICROSCOPIC
BILIRUBIN URINE: NEGATIVE
KETONES UR: NEGATIVE
Leukocytes, UA: NEGATIVE
NITRITE: NEGATIVE
Specific Gravity, Urine: <=1.005 — AB (ref 1.000–1.030)
TOTAL PROTEIN, URINE-UPE24: NEGATIVE
Urine Glucose: NEGATIVE
Urobilinogen, UA: 0.2 (ref 0.0–1.0)
pH: 5 (ref 5.0–8.0)

## 2016-04-25 LAB — MICROALBUMIN / CREATININE URINE RATIO
CREATININE, U: 35.8 mg/dL
MICROALB/CREAT RATIO: 29.9 mg/g (ref 0.0–30.0)
Microalb, Ur: 10.7 mg/dL — ABNORMAL HIGH (ref 0.0–1.9)

## 2016-04-25 LAB — HEMOGLOBIN A1C: HEMOGLOBIN A1C: 6 % (ref 4.6–6.5)

## 2016-04-25 LAB — LIPID PANEL
CHOLESTEROL: 182 mg/dL (ref 0–200)
HDL: 69.7 mg/dL (ref >39.00)
LDL Cholesterol: 96 mg/dL (ref 0–99)
NONHDL: 112.61
TRIGLYCERIDES: 83 mg/dL (ref 0.0–149.0)
Total CHOL/HDL Ratio: 3
VLDL: 16.6 mg/dL (ref 0.0–40.0)

## 2016-04-25 LAB — MAGNESIUM: Magnesium: 1.8 mg/dL (ref 1.5–2.5)

## 2016-04-28 ENCOUNTER — Other Ambulatory Visit: Payer: Self-pay | Admitting: Internal Medicine

## 2016-05-02 NOTE — Telephone Encounter (Signed)
Sent to the pharmacy by e-scribe for 6 months.  Recent lab work on 04/25/16.

## 2016-05-04 ENCOUNTER — Other Ambulatory Visit: Payer: Self-pay | Admitting: Internal Medicine

## 2016-05-05 ENCOUNTER — Ambulatory Visit (INDEPENDENT_AMBULATORY_CARE_PROVIDER_SITE_OTHER): Payer: Medicare Other | Admitting: Internal Medicine

## 2016-05-05 ENCOUNTER — Encounter: Payer: Self-pay | Admitting: Internal Medicine

## 2016-05-05 VITALS — BP 148/60 | Temp 98.2°F | Ht 61.0 in | Wt 182.0 lb

## 2016-05-05 DIAGNOSIS — I10 Essential (primary) hypertension: Secondary | ICD-10-CM

## 2016-05-05 DIAGNOSIS — N183 Chronic kidney disease, stage 3 unspecified: Secondary | ICD-10-CM

## 2016-05-05 DIAGNOSIS — R7303 Prediabetes: Secondary | ICD-10-CM

## 2016-05-05 DIAGNOSIS — R7301 Impaired fasting glucose: Secondary | ICD-10-CM

## 2016-05-05 DIAGNOSIS — N289 Disorder of kidney and ureter, unspecified: Secondary | ICD-10-CM | POA: Diagnosis not present

## 2016-05-05 DIAGNOSIS — F411 Generalized anxiety disorder: Secondary | ICD-10-CM

## 2016-05-05 MED ORDER — ALBUTEROL SULFATE HFA 108 (90 BASE) MCG/ACT IN AERS: INHALATION_SPRAY | RESPIRATORY_TRACT | 4 refills | Status: DC

## 2016-05-05 NOTE — Patient Instructions (Signed)
Consider seeing allergist if   Flonase antihistamine and Singulair .   Not belong allergy  Nasal symptoms . Let us know if you want to  Proceed /..  Your blood    Sugar is stable today   Repeat bp  148/60.  Can try 10 mg per day  Citalopram.. Instead of 15 mg to see if less drowsy .   While send  In refill albuterol.  As we discussed .   Get [place] the    Information about BP medication and see if we need to change formulation . Labs are stable and  Good today.

## 2016-05-05 NOTE — Progress Notes (Signed)
Chief Complaint  Patient presents with  . Follow-up    HPI: [name] 82 y.o.  Fu [name]  comes in today for follow up of  multiple medical problems.   RESP: "ashtma"Needs a refill on her albuterol metered-dose inhaler.  Keeps having what she calls allergy symptoms which his eyes itching irritated nose drainage comes and goes. She is taking Flonase and antihistamine and Singulair. Still has symptoms coming and going in her eye doctor said take allergy medicine. No pets new exposures. Symptoms are better today.  Anxiety  Citalopram and  lorazepam as needed 15 mg of citalopram seems to help some but makes her drowsy and tired. In the pills are hard to touch.  CKD has seen a nephrologist who felt that things were stable and going to continue same medicine and see her in 6-12 months.  Anemain related follow-up hematology.  ht blood pressure readings at home per [facility] this past week 154/60-160/64 158/68. ROS: See pertinent positives and negatives per HPI. No fever hemoptysis increased breathing issues today. Asks if medication can cause hair thinning. Has some hair thinning on top of her scalp without a rash.  Past Medical History:  Diagnosis Date  . Allergy   . Anemia   . Anxiety   . Arthritis    "shoulders" (09/09/2012)  . Asthma    " onset when young"   . Diabetes mellitus without complication ([place])    "Borderline" per pt; being monitored  . GERD (gastroesophageal reflux disease)   . Headache(784.0)    "related to my high blood pressure" (09/09/2012)  . History of DVT (deep vein thrombosis)    "RLE" (09/09/2012) coumadine cant take asa so on plavix   . Hyperlipidemia   . Hypertension   . Lupus    "cured years ago" (09/09/2012)  . Varicose veins     Family History  Problem Relation Age of Onset  . Hypertension Mother   . Hypertension Father   . Cancer Sister     Colon and Breast Cancer  . Hypertension Brother   . Heart attack Daughter   . Lung cancer       both parents     Social History   Social History  . Marital status: Single    Spouse name: N/A  . Number of children: N/A  . Years of education: N/A   Social History Main Topics  . Smoking status: Never Smoker  . Smokeless tobacco: Never Used  . Alcohol use No  . Drug use: No  . Sexual activity: Not Asked   Other Topics Concern  . None   Social History Narrative   2 people living in the home.  Grand daughter.   Up and down through the night      Had 7 children  2 deceased . 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    works 3 days per week currently .   Glasses dentures  Neg tad     Outpatient Medications Prior to Visit  Medication Sig Dispense Refill  . acetaminophen (TYLENOL) 500 MG tablet Take 1,000 mg by mouth every 6 (six) hours as needed for moderate pain. Reported on 04/30/2015    . ADVAIR DISKUS 250-50 MCG/DOSE AEPB USE 1 PUFF TWICE DAILY. 60 each 3  . albuterol (PROVENTIL) (2.5 MG/3ML) 0.083% nebulizer solution USE 1 AMPULE EVERY 6 HOURS AS NEEDED  FOR WHEEZE 75 mL 1  . azithromycin (ZITHROMAX Z-PAK) 250 MG tablet Take 2 po first day, then 1 po qd 6 tablet 0  . citalopram (CELEXA) 10 MG tablet TAKE 1&1/2 TABLETS DAILY FOR ANXIETY/PANIC. 45 tablet 3  . clopidogrel (PLAVIX) 75 MG tablet TAKE 1 TABLET ONCE DAILY. 30 tablet 5  . diclofenac sodium (VOLTAREN) 1 % GEL APPLY 4GM TO AFFECTED AREA(S) 4 TIMES A DAY. 100 g 1  . doxazosin (CARDURA) 2 MG tablet TAKE 3 TABLETS AT BEDTIME. 90 tablet 5  . fenofibrate 160 MG tablet TAKE 1 TABLET ONCE DAILY. 90 tablet 1  . ferrous sulfate 325 (65 FE) MG EC tablet Take 1 tablet (325 mg total) by mouth daily with breakfast. 30 tablet 3  . furosemide (LASIX) 20 MG tablet TAKE 1 TABLET TWICE DAILY. 60 tablet 5  . guaiFENesin (MUCINEX) 600 MG 12 hr tablet Take 600 mg by mouth 2 (two) times daily as needed for cough or to loosen phlegm. Reported on 04/30/2015     . HYDROcodone-acetaminophen (NORCO/VICODIN) 5-325 MG tablet Reported on 06/29/2015    . LORazepam (ATIVAN) 1 MG tablet TAKE 1 TABLET EVERY 8 HOURS AS NEEDED FOR ANXIETY. 24 tablet 0  . metoprolol succinate (TOPROL-XL) 50 MG 24 hr tablet TAKE 1 TABLET ONCE DAILY WITH FOOD. 30 tablet 2  . montelukast (SINGULAIR) 10 MG tablet TAKE 1 TABLET ONCE DAILY. 90 tablet 3  . pantoprazole (PROTONIX) 40 MG tablet TAKE 1 TABLET ONCE DAILY. 30 tablet 2  . potassium chloride (K-DUR) 10 MEQ tablet TAKE 4 TABLETS DAILY. 120 tablet 3  . RESTASIS 0.05 % ophthalmic emulsion     . TRIBENZOR 40-10-25 MG TABS TAKE 1 TABLET ONCE DAILY. 30 tablet 2  . vitamin B-12 (CYANOCOBALAMIN) 1000 MCG tablet Take 1,000 mcg by mouth daily.    [name] PROAIR HFA 108 (90 Base) MCG/ACT inhaler USE 2 PUFFS EVERY 6 HOURS AS NEEDED FOR WHEEZING. 8.5 g 0   No facility-administered medications prior to visit.      EXAM:  BP (!) 148/60 (BP Location: Right Arm, Patient Position: Sitting)   Temp 98.2 F (36.8 C) (Oral)   Ht 5\' 1"  (1.549 m)   Wt 182 lb (82.6 kg)   BMI 34.39 kg/m   Body mass index is 34.39 kg/m.  GENERAL: vitals reviewed and listed above, alert, oriented, appears well hydrated and in no acute distress HEENT: atraumatic, conjunctiva  clear, no obvious abnormalities on inspection of external nose and ears mild nasal congestion boggy turbinates left greater than right face nontender. OP : no lesion edema or exudate  NECK: no obvious masses on inspection palpation  LUNGS: clear to auscultation bilaterally, no wheezes, rales or rhonchi, g CV: HRRR, no clubbing cyanosis 1+ peripheral edema nl cap refill  MS: moves all extremities without noticeable focal  abnormality arthritis changes gait independent. PSYCH: pleasant and cooperative, no obvious depression or anxiety Lab work reviewed. Lab Results  Component Value Date   WBC 3.9 (L) 12/27/2015   HGB 10.6 (L) 12/27/2015   HCT 31.9 (L) 12/27/2015   PLT 180.0 12/27/2015    GLUCOSE 132 (H) 04/25/2016   CHOL 182 04/25/2016   TRIG 83.0 04/25/2016   HDL 69.70 04/25/2016   LDLCALC 96 04/25/2016   ALT 19 08/16/2015   AST 28 08/16/2015   NA 137 04/25/2016   K 3.8 04/25/2016   CL 99 04/25/2016   CREATININE 1.34 (H) 04/25/2016   BUN 25 (H) 04/25/2016   CO2 29 04/25/2016  TSH 2.29 12/27/2015   INR 0.94 12/22/2009   HGBA1C 6.0 04/25/2016   MICROALBUR 10.7 (H) 04/25/2016    BP Readings from Last 3 Encounters:  05/05/16 (!) 148/60  03/27/16 (!) 168/52  02/21/16 (!) 148/70   Wt Readings from Last 3 Encounters:  05/05/16 182 lb (82.6 kg)  03/27/16 185 lb 6.4 oz (84.1 kg)  02/21/16 179 lb 3.2 oz (81.3 kg)      ASSESSMENT AND PLAN:  Discussed the following assessment and plan:  Essential hypertension - Has been difficult to control up and down reviewed by nephrology get this information from insurance about formulary.  Anxiety state - Reviewed medication can try decreasing to 10 mg a citalopram to avoid side effects.  Renal insufficiency creatinine 1.3  Pre-diabetes vs early DM   CKD (chronic kidney disease) stage 3, GFR 30-59 ml/min - Nephrology consult stable.  Fasting hyperglycemia - Hemoglobin A1c is 6.0 no proteinuria. Blood work stable  a1c in [place] range  Total visit 73mins > 50% spent counseling and coordinating care as indicated in above note and in instructions to patient .  -Patient advised to return or notify health care team  if symptoms worsen ,persist or new concerns arise.  Patient Instructions  Consider seeing allergist if   Flonase antihistamine and Singulair .   Not belong allergy  Nasal symptoms . Let us know if you want to  Proceed /..  Your blood    Sugar is stable today   Repeat bp  148/60.  Can try 10 mg per day  Citalopram.. Instead of 15 mg to see if less drowsy .   While send  In refill albuterol.  As we discussed .   Get [place] the    Information about BP medication and see if we need to change formulation  . Labs are stable and  Good today.       Standley Brooking. Lakyn Mantione M.D.

## 2016-05-05 NOTE — Progress Notes (Signed)
Pre visit review using our clinic review tool, if applicable. No additional management support is needed unless otherwise documented below in the visit note. 

## 2016-05-18 ENCOUNTER — Other Ambulatory Visit: Payer: Self-pay | Admitting: Family Medicine

## 2016-05-19 ENCOUNTER — Other Ambulatory Visit: Payer: Self-pay | Admitting: Internal Medicine

## 2016-05-19 ENCOUNTER — Telehealth: Payer: Self-pay

## 2016-05-19 MED ORDER — DICLOFENAC SODIUM 1 % TD GEL: TRANSDERMAL | 1 refills | Status: DC

## 2016-05-19 NOTE — Telephone Encounter (Signed)
Received PA request from [facility] for Diclofenac sodium 1% gel. PA submitted & approved. Form faxed back to pharmacy.

## 2016-05-19 NOTE — Telephone Encounter (Signed)
Sent to the pharmacy by e-scribe.  Last bmp on 04/25/16 and potassium normal.

## 2016-05-23 ENCOUNTER — Other Ambulatory Visit: Payer: Self-pay | Admitting: Internal Medicine

## 2016-05-24 ENCOUNTER — Ambulatory Visit: Payer: Medicare Other | Admitting: Hematology

## 2016-05-24 ENCOUNTER — Other Ambulatory Visit: Payer: Medicare Other

## 2016-05-24 NOTE — Telephone Encounter (Signed)
Sent to the pharmacy by e-scribe for 5 months.  Pt has upcoming follow up 09/2016.

## 2016-05-26 ENCOUNTER — Encounter (HOSPITAL_COMMUNITY): Payer: Self-pay | Admitting: Emergency Medicine

## 2016-05-26 ENCOUNTER — Ambulatory Visit (HOSPITAL_COMMUNITY)
Admission: EM | Admit: 2016-05-26 | Discharge: 2016-05-26 | Disposition: A | Discharge status: Home / Self Care | Payer: Medicare Other | Attending: Emergency Medicine | Admitting: Emergency Medicine

## 2016-05-26 ENCOUNTER — Telehealth: Payer: Self-pay | Admitting: Internal Medicine

## 2016-05-26 DIAGNOSIS — J069 Acute upper respiratory infection, unspecified: Secondary | ICD-10-CM | POA: Diagnosis not present

## 2016-05-26 DIAGNOSIS — J45901 Unspecified asthma with (acute) exacerbation: Secondary | ICD-10-CM | POA: Diagnosis not present

## 2016-05-26 MED ORDER — AEROCHAMBER PLUS MISC: 2 refills | Status: DC

## 2016-05-26 MED ORDER — METHYLPREDNISOLONE ACETATE 80 MG/ML IJ SUSP
INTRAMUSCULAR | Status: AC
  Filled 2016-05-26: qty 1

## 2016-05-26 MED ORDER — IPRATROPIUM-ALBUTEROL 0.5-2.5 (3) MG/3ML IN SOLN
RESPIRATORY_TRACT | Status: AC
  Filled 2016-05-26: qty 3

## 2016-05-26 MED ORDER — PREDNISONE 20 MG PO TABS: 40.0000 mg | ORAL_TABLET | Freq: Every day | ORAL | 0 refills | Status: AC

## 2016-05-26 MED ORDER — METHYLPREDNISOLONE ACETATE 80 MG/ML IJ SUSP
80.0000 mg | Freq: Once | INTRAMUSCULAR | Status: AC
  Administered 2016-05-26: 80 mg via INTRAMUSCULAR

## 2016-05-26 MED ORDER — IPRATROPIUM-ALBUTEROL 0.5-2.5 (3) MG/3ML IN SOLN
3.0000 mL | Freq: Once | RESPIRATORY_TRACT | Status: AC
  Administered 2016-05-26: 3 mL via RESPIRATORY_TRACT

## 2016-05-26 NOTE — ED Triage Notes (Addendum)
Here for cold sx onset 4 days associated w/prod cough, wheezing, fever blister on upper lip  Hx of asthma   Denies fevers, chills  Taking OTC Mucinex w/no relief, and using her nebulizer.   A&O x4... NAD

## 2016-05-26 NOTE — ED Provider Notes (Signed)
HPI  SUBJECTIVE:  [name] is a 83 y.o. female who presents with yellow nasal congestion, rhinorrhea, postnasal drip, chest tightness, cough productive of yellowish sputum, sinus and nasal congestion for the past 2-3 days. She reports sinus pressure. She tried a nebulizer treatment, Flonase and Advair which helped her symptoms. She does not take the Flonase to use Advair on a regular basis. No aggravating factors. No fevers, nausea, vomiting, chest pain, shortness of breath, posttussive emesis. No sore throat. No upper dental pain, facial swelling. No antipyretic in the past 6-8 hours, no antibiotics in the past month. She has a past medical history of right lower extremity DVT on Plavix, asthma, hypertension, lupus, GERD, chronic kidney disease. She has a history of prediabetes.  She was seen by her primary care physician will 12/22 had her albuterol inhaler refilled. O2 sats on 03/27/2016 99%. Thought to have an asthma exacerbation, treated with Depo-Medrol 80 mg and a DuoNeb. PCP: [name], MD   Past Medical History:  Diagnosis Date  . Allergy   . Anemia   . Anxiety   . Arthritis    "shoulders" (09/09/2012)  . Asthma    " onset when young"   . Diabetes mellitus without complication ([place])    "Borderline" per pt; being monitored  . GERD (gastroesophageal reflux disease)   . Headache(784.0)    "related to my high blood pressure" (09/09/2012)  . History of DVT (deep vein thrombosis)    "RLE" (09/09/2012) coumadine cant take asa so on plavix   . Hyperlipidemia   . Hypertension   . Lupus    "cured years ago" (09/09/2012)  . Varicose veins     Past Surgical History:  Procedure Laterality Date  . ABDOMINAL HYSTERECTOMY     partial  . BREAST SURGERY    . CARPAL TUNNEL RELEASE Right 2000's  . CARPAL TUNNEL RELEASE Bilateral 10/21/2013   Procedure: BILATERAL  CARPAL TUNNEL RELEASE;  Surgeon: [name], MD;  Location: [place];  Service: Orthopedics;   Laterality: Bilateral;  . SHOULDER OPEN ROTATOR CUFF REPAIR Bilateral 2000's  . TRIGGER FINGER RELEASE Right 10/21/2013   Procedure: RELEASE TRIGGER FINGER/A-1 PULLEY RIGHT MIDDLE AND RIGHT RING;  Surgeon: [name], MD;  Location: [place];  Service: Orthopedics;  Laterality: Right;    Family History  Problem Relation Age of Onset  . Hypertension Mother   . Hypertension Father   . Cancer Sister     Colon and Breast Cancer  . Hypertension Brother   . Heart attack Daughter   . Lung cancer      both parents     Social History  Substance Use Topics  . Smoking status: Never Smoker  . Smokeless tobacco: Never Used  . Alcohol use No    No current facility-administered medications for this encounter.   Current Outpatient Prescriptions:  .  ADVAIR DISKUS 250-50 MCG/DOSE AEPB, USE 1 PUFF TWICE DAILY., Disp: 60 each, Rfl: 3 .  albuterol (PROAIR HFA) 108 (90 Base) MCG/ACT inhaler, USE 2 PUFFS EVERY 6 HOURS AS NEEDED FOR WHEEZING., Disp: 8.5 g, Rfl: 4 .  albuterol (PROVENTIL) (2.5 MG/3ML) 0.083% nebulizer solution, USE 1 AMPULE EVERY 6 HOURS AS NEEDED FOR WHEEZE, Disp: 75 mL, Rfl: 1 .  citalopram (CELEXA) 10 MG tablet, TAKE 1&1/2 TABLETS DAILY FOR ANXIETY/PANIC., Disp: 45 tablet, Rfl: 3 .  clopidogrel (PLAVIX) 75 MG tablet, TAKE 1 TABLET ONCE DAILY., Disp: 30 tablet, Rfl: 5 .  diclofenac sodium (  VOLTAREN) 1 % GEL, APPLY 4GM TO AFFECTED AREA(S) 4 TIMES A DAY., Disp: 100 g, Rfl: 1 .  doxazosin (CARDURA) 2 MG tablet, TAKE 3 TABLETS AT BEDTIME., Disp: 90 tablet, Rfl: 5 .  fenofibrate 160 MG tablet, TAKE 1 TABLET ONCE DAILY., Disp: 90 tablet, Rfl: 1 .  ferrous sulfate 325 (65 FE) MG EC tablet, Take 1 tablet (325 mg total) by mouth daily with breakfast., Disp: 30 tablet, Rfl: 3 .  furosemide (LASIX) 20 MG tablet, TAKE 1 TABLET TWICE DAILY., Disp: 60 tablet, Rfl: 5 .  guaiFENesin (MUCINEX) 600 MG 12 hr tablet, Take 600 mg by mouth 2 (two) times daily as needed for cough or to  loosen phlegm. Reported on 04/30/2015, Disp: , Rfl:  .  LORazepam (ATIVAN) 1 MG tablet, TAKE 1 TABLET EVERY 8 HOURS AS NEEDED FOR ANXIETY., Disp: 24 tablet, Rfl: 0 .  metoprolol succinate (TOPROL-XL) 50 MG 24 hr tablet, TAKE 1 TABLET ONCE DAILY WITH FOOD., Disp: 30 tablet, Rfl: 4 .  montelukast (SINGULAIR) 10 MG tablet, TAKE 1 TABLET ONCE DAILY., Disp: 90 tablet, Rfl: 3 .  pantoprazole (PROTONIX) 40 MG tablet, TAKE 1 TABLET ONCE DAILY., Disp: 30 tablet, Rfl: 4 .  potassium chloride (K-DUR) 10 MEQ tablet, TAKE 4 TABLETS DAILY., Disp: 120 tablet, Rfl: 4 .  RESTASIS 0.05 % ophthalmic emulsion, , Disp: , Rfl:  .  TRIBENZOR 40-10-25 MG TABS, TAKE 1 TABLET ONCE DAILY., Disp: 30 tablet, Rfl: 4 .  vitamin B-12 (CYANOCOBALAMIN) 1000 MCG tablet, Take 1,000 mcg by mouth daily., Disp: , Rfl:  .  acetaminophen (TYLENOL) 500 MG tablet, Take 1,000 mg by mouth every 6 (six) hours as needed for moderate pain. Reported on 04/30/2015, Disp: , Rfl:  .  predniSONE (DELTASONE) 20 MG tablet, Take 2 tablets (40 mg total) by mouth daily with breakfast., Disp: 10 tablet, Rfl: 0 .  Spacer/Aero-Holding Chambers (AEROCHAMBER PLUS) inhaler, Use as instructed, Disp: 1 each, Rfl: 2  Allergies  Allergen Reactions  . Penicillins Nausea And Vomiting and Other (See Comments)    Wheezing  . Aspirin Other (See Comments)    wheezing     ROS  As noted in HPI.   Physical Exam  BP 180/56 (BP Location: Left Arm)   Pulse 80   Temp 99.8 F (37.7 C) (Oral)   Resp 17   SpO2 95%    BP Readings from Last 3 Encounters:  05/26/16 180/56  05/05/16 (!) 148/60  03/27/16 (!) 168/52    Constitutional: Well developed, well nourished, no acute distress Eyes:  EOMI, conjunctiva normal bilaterally HENT: Normocephalic, atraumatic,mucus membranes moist. Clear nasal congestion, red, swollen turbinates, mild maxillary sinus tenderness. Positive cobblestoning, postnasal drip. Respiratory: Normal inspiratory effort, fair air movement,  wheezing throughout all lung fields. No rales, rhonchi Cardiovascular: Normal rate regular rhythm no murmurs rubs or gallops  GI: nondistended skin: No rash, skin intact Musculoskeletal: no deformities. Calves Symmetric, 1-2+ edema bilateral lower extremities, patient states this is not new. Neurologic: Alert & oriented x 3, no focal neuro deficits Psychiatric: Speech and behavior appropriate   ED Course   Medications  methylPREDNISolone acetate (DEPO-MEDROL) injection 80 mg (80 mg Intramuscular Given 05/26/16 2052)  ipratropium-albuterol (DUONEB) 0.5-2.5 (3) MG/3ML nebulizer solution 3 mL (3 mLs Nebulization Given 05/26/16 2055)    No orders of the defined types were placed in this encounter.   No results found for this or any previous visit (from the past 24 hour(s)). No results found.  ED Clinical Impression  Upper respiratory tract infection, unspecified type  Mild asthma with exacerbation, unspecified whether persistent   ED Assessment/Plan  previous records reviewed. As noted in history of present illness.  Patient has diffuse wheezing throughout all lung fields., presentation c/w upper respiratory infection  triggering her asthma, and a mild maxillary sinusitis. Giving her a DuoNeb and Depo-Medrol 80 mg IM and she has tolerated this well previously and she is satting 95% on room air today, which is decreased from her previous oxygen saturation 99%. Deferring XR as she has no focal lung findings. Will reevaluate.   On reevaluation, patient states she feels significantly better. She has loud wheezing throughout all lung fields, no rales, rhonchi. Repeat oxygen saturation 100%. Plan to send home with regular bronchodilators 2 puffs from her albuterol or nebulizer treatment every 4-6 hours as needed for coughing, wheezing, shortness of breath, she will start using her Flonase, and Advair on aalbuter regular basis and start some saline nasal irrigation. rx'ing spacer for her to  use with her albuterol inhaler. We'll send her home on prednisone 40 mg for 5 days, she may start this tomorrow. She will follow-up with her primary care physician in 2-3 days for reevaluation. Withholding antibiotics at this point in time, thinking this is most likely viral.  Discussed  MDM, plan and followup with patient. Discussed sn/sx that should prompt return to the ED. Patient agrees with plan.   Meds ordered this encounter  Medications  . methylPREDNISolone acetate (DEPO-MEDROL) injection 80 mg  . ipratropium-albuterol (DUONEB) 0.5-2.5 (3) MG/3ML nebulizer solution 3 mL  . predniSONE (DELTASONE) 20 MG tablet    Sig: Take 2 tablets (40 mg total) by mouth daily with breakfast.    Dispense:  10 tablet    Refill:  0  . Spacer/Aero-Holding Chambers (AEROCHAMBER PLUS) inhaler    Sig: Use as instructed    Dispense:  1 each    Refill:  2    *This clinic note was created using Lobbyist. Therefore, there may be occasional mistakes despite careful proofreading.  ?   Melynda Ripple, MD 05/26/16 2123

## 2016-05-26 NOTE — Discharge Instructions (Signed)
Take the medication as written.  Use her Flonase and Advair on a regular basis. Use the albuterol inhaler with the spacer that I am prescribing to you. This will deliver more medicine to your lungs. Use a neti pot or the NeilMed sinus rinse as often as you want to to reduce nasal congestion. Follow the directions on the box.   Go to www.goodrx.com to look up your medications. This will give you a list of where you can find your prescriptions at the most affordable prices.

## 2016-05-26 NOTE — Telephone Encounter (Signed)
° ° °  Pt asked if you would give her a call Misty. I tried to fine out the reason she would not give it to me.

## 2016-05-27 ENCOUNTER — Ambulatory Visit: Payer: Medicare Other | Admitting: Family

## 2016-05-27 ENCOUNTER — Ambulatory Visit: Payer: Medicare Other | Admitting: Family Medicine

## 2016-05-29 ENCOUNTER — Other Ambulatory Visit: Payer: Medicare Other

## 2016-05-29 ENCOUNTER — Ambulatory Visit: Payer: Medicare Other | Admitting: Hematology

## 2016-05-29 NOTE — Telephone Encounter (Signed)
Spoke to the pt and she was seen at an [facility].  Pt states she was given an injection and prednisone.  Advised pt to come in and be seen if not getting better.

## 2016-05-29 NOTE — Telephone Encounter (Signed)
Spoke to the pt on 05/26/16.  Pt stated she was sick and congested.  Due to history, WP advised [facility].  Transferred to scheduling.

## 2016-06-08 ENCOUNTER — Telehealth: Payer: Self-pay

## 2016-06-08 NOTE — Telephone Encounter (Signed)
Received PA request from insurance for Tribenzor 40-10-25 tablets. PA submitted & is pending. Key: [id number]

## 2016-06-12 NOTE — Telephone Encounter (Signed)
PA denied. Formulary alternatives: Amlodipine-valsartan-hydrochlorothiazide Candesartan-hydrochlorothiazide Combion of angiotensin receptor blocker ( candesartan, eprosartan, irbesartan, losartan, telmisartan, valsartan, olmesartan, Edarbi), calcium channel blocker (amlodipine, felodipine ER, nifedipine ER), and hydrochlorothiazide as separate products. Edarbyclor. Irbesartan-hydrochlorothiazide, losartan-hydrochlorothiazide, olmesartan-amlodipine-hydrochlorothiazide, olmesartan-hydrochlorothiazide, telmisartan-hydrochlorothiazide, valsartan-hydrochlorothiazide.

## 2016-06-16 NOTE — Progress Notes (Signed)
[facility] PROGRESS NOTE Date of Visit: 02/16/2014  Angela Dawson, MD [place] [address]  DIAGNOSIS: Anemia of chronic disease - Plan: CBC with Differential  CKD (chronic kidney disease) stage 3, GFR 30-59 ml/min - Plan: Comprehensive metabolic panel  History of DVT (deep vein thrombosis)  Essential hypertension  CHIEF COMPLAIN: follow up anemia   CURRENT THERAPY: Ferrous sulfate 325 mg daily with orange juice and B12 once daily .   INTERVAL HISTORY: [name] returns for follow up. She is doing well overall. She admits she gets a little tired but her energy level is alright - some days it is worse than others. 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 She continues to drive. She is taking an iron pill and Vitamin B supplement. She tries to stay active.  MEDICAL HISTORY:  Past Medical History:  Diagnosis Date  . Allergy   . Anemia   . Anxiety   . Arthritis    "shoulders" (09/09/2012)  . Asthma    " onset when young"   . Diabetes mellitus without complication ([place])    "Borderline" per pt; being monitored  . GERD (gastroesophageal reflux disease)   . Headache(784.0)    "related to my high blood pressure" (09/09/2012)  . History of DVT (deep vein thrombosis)    "RLE" (09/09/2012) coumadine cant take asa so on plavix   . Hyperlipidemia   . Hypertension   . Lupus    "cured years ago" (09/09/2012)  . Varicose veins      ALLERGIES:  is allergic to penicillins and aspirin.  MEDICATIONS: Allergies as of 06/19/2016      Reactions   Penicillins Nausea And Vomiting, Other (See Comments)   Wheezing   Aspirin Other (See Comments)   wheezing      Medication List       Accurate as of 06/19/16  2:18 PM. Always use your most recent med list.          acetaminophen 500 MG tablet Commonly known as:  TYLENOL Take 1,000 mg by mouth every 6 (six) hours as needed for moderate pain.  Reported on 04/30/2015   ADVAIR DISKUS 250-50 MCG/DOSE Aepb Generic drug:  Fluticasone-Salmeterol USE 1 PUFF TWICE DAILY.   AEROCHAMBER PLUS inhaler Use as instructed   albuterol (2.5 MG/3ML) 0.083% nebulizer solution Commonly known as:  PROVENTIL USE 1 AMPULE EVERY 6 HOURS AS NEEDED FOR WHEEZE   albuterol 108 (90 Base) MCG/ACT inhaler Commonly known as:  PROAIR HFA USE 2 PUFFS EVERY 6 HOURS AS NEEDED FOR WHEEZING.   citalopram 10 MG tablet Commonly known as:  CELEXA TAKE 1&1/2 TABLETS DAILY FOR ANXIETY/PANIC.   clopidogrel 75 MG tablet Commonly known as:  PLAVIX TAKE 1 TABLET ONCE DAILY.   diclofenac sodium 1 % Gel Commonly known as:  VOLTAREN APPLY 4GM TO AFFECTED AREA(S) 4 TIMES A DAY.   doxazosin 2 MG tablet Commonly known as:  CARDURA TAKE 3 TABLETS AT BEDTIME.   fenofibrate 160 MG tablet TAKE 1 TABLET ONCE DAILY.   ferrous sulfate 325 (65 FE) MG EC tablet Take 1 tablet (325 mg total) by mouth daily with breakfast.   furosemide 20 MG tablet Commonly known as:  LASIX TAKE 1 TABLET TWICE DAILY.   guaiFENesin 600 MG 12 hr tablet Commonly known as:  MUCINEX Take 600 mg by mouth 2 (two) times daily as needed for cough or to loosen phlegm. Reported on 04/30/2015  LORazepam 1 MG tablet Commonly known as:  ATIVAN TAKE 1 TABLET EVERY 8 HOURS AS NEEDED FOR ANXIETY.   metoprolol succinate 50 MG 24 hr tablet Commonly known as:  TOPROL-XL TAKE 1 TABLET ONCE DAILY WITH FOOD.   montelukast 10 MG tablet Commonly known as:  SINGULAIR TAKE 1 TABLET ONCE DAILY.   pantoprazole 40 MG tablet Commonly known as:  PROTONIX TAKE 1 TABLET ONCE DAILY.   potassium chloride 10 MEQ tablet Commonly known as:  K-DUR TAKE 4 TABLETS DAILY.   RESTASIS 0.05 % ophthalmic emulsion Generic drug:  cycloSPORINE   TRIBENZOR 40-10-25 MG Tabs Generic drug:  Olmesartan-Amlodipine-HCTZ TAKE 1 TABLET ONCE DAILY.   vitamin B-12 1000 MCG tablet Commonly known as:  CYANOCOBALAMIN Take  1,000 mcg by mouth daily.       SURGICAL HISTORY:  Past Surgical History:  Procedure Laterality Date  . ABDOMINAL HYSTERECTOMY     partial  . BREAST SURGERY    . CARPAL TUNNEL RELEASE Right 2000's  . CARPAL TUNNEL RELEASE Bilateral 10/21/2013   Procedure: BILATERAL  CARPAL TUNNEL RELEASE;  Surgeon: [name], MD;  Location: [facility];  Service: Orthopedics;  Laterality: Bilateral;  . SHOULDER OPEN ROTATOR CUFF REPAIR Bilateral 2000's  . TRIGGER FINGER RELEASE Right 10/21/2013   Procedure: RELEASE TRIGGER FINGER/A-1 PULLEY RIGHT MIDDLE AND RIGHT RING;  Surgeon: [name], MD;  Location: [facility];  Service: Orthopedics;  Laterality: Right;    REVIEW OF SYSTEMS:   Constitutional: Denies fevers, chills or abnormal weight loss (+) mild fatigue Eyes: Denies blurriness of vision Ears, nose, mouth, throat, and face: Denies mucositis or sore throat Respiratory: Denies cough, dyspnea or wheezes Cardiovascular: Denies palpitation, chest discomfort or lower extremity swelling Gastrointestinal:  Denies nausea, heartburn or change in bowel habits Skin: Denies abnormal skin rashes Lymphatics: Denies new lymphadenopathy or easy bruising Neurological:Denies numbness, tingling or new weaknesses Behavioral/Psych: Mood is stable, no new changes  All other systems were reviewed with the patient and are negative.  PHYSICAL EXAMINATION: ECOG PERFORMANCE STATUS: 1  Blood pressure (!) 156/63, pulse 60, temperature 98.6 F (37 C), temperature source Oral, resp. rate 18, height _0  (1.549 m), weight 183 lb 9.6 oz (83.3 kg), SpO2 98 %.  GENERAL:alert, no distress and comfortable; elderly female who appears her stated age SKIN: skin color, texture, turgor are normal, no rashes or significant lesions EYES: normal, Conjunctiva are pink and non-injected, sclera clear OROPHARYNX:no exudate, no erythema and lips, buccal mucosa, and tongue normal  NECK: supple, thyroid  normal size, non-tender, without nodularity LYMPH:  no palpable lymphadenopathy in the cervical, axillary or supraclavicular LUNGS: clear to auscultation with normal breathing effort HEART: regular rate & rhythm and no murmurs and 1+ lower extremity edema with chronic venous changes ABDOMEN:abdomen soft, non-tender and normal bowel sounds Musculoskeletal:no cyanosis of digits and no clubbing  NEURO: alert & oriented x 3 with fluent speech, no focal motor/sensory deficits EXT: (+) b/l leg pitting edema up to knee   LABORATORY DATA:  Labs:  CBC Latest Ref Rng & Units 06/19/2016 12/27/2015 08/16/2015  WBC 3.9 - 10.3 10e3/uL 4.4 3.9(L) 5.4  Hemoglobin 11.6 - 15.9 g/dL 10.6(L) 10.6(L) 10.4(L)  Hematocrit 34.8 - 46.6 % 32.9(L) 31.9(L) 31.3(L)  Platelets 145 - 400 10e3/uL 194 180.0 194    CMP Latest Ref Rng & Units 06/19/2016 04/25/2016 12/27/2015  Glucose 70 - 140 mg/dl 105 132(H) 111(H)  BUN 7.0 - 26.0 mg/dL 26.6(H) 25(H) 29(H)  Creatinine 0.6 -  1.1 mg/dL 1.4(H) 1.34(H) 1.56(H)  Sodium 136 - 145 mEq/L 136 137 138  Potassium 3.5 - 5.1 mEq/L 3.8 3.8 3.9  Chloride 96 - 112 mEq/L - 99 101  CO2 22 - 29 mEq/L 31(H) 29 28  Calcium 8.4 - 10.4 mg/dL 9.8 9.9 9.8  Total Protein 6.4 - 8.3 g/dL 6.6 - -  Total Bilirubin 0.20 - 1.20 mg/dL 0.32 - -  Alkaline Phos 40 - 150 U/L 37(L) - -  AST 5 - 34 U/L 22 - -  ALT 0 - 55 U/L 14 - -   Results for [name], [name] (MRN [id number])   Ref. Range 11/23/2014 09:20 02/22/2015 10:13 08/16/2015 11:35  Iron Latest Ref Range: 41 - 142 ug/dL 60 93 76  UIBC Latest Ref Range: 120 - 384 ug/dL 265 266 281  TIBC Latest Ref Range: 236 - 444 ug/dL 325 359 358  %SAT Latest Ref Range: 21 - 57 % 18 (L) 26 21  Ferritin Latest Ref Range: 9 - 269 ng/ml 112 104 105    ASSESSMENT: [name] 83 y.o. female with a history of Anemia from CKD    PLAN:  1. Anemia likely anemia of chronic disease/kidney disease.  --Her initial  iron study, ferritin,screening SPEP was within  normal limits. Both her ESR and CRP was within normal limits. Her TSH screening was negative. Her folate level in April of this year was within normal limits. She is on vitamin B12 with her last level of 1620 in April. Her anemia is likely secondary to her moderate chronic renal disease. - Her hemoglobin is 10.4 today. she is not symptomatic, and functions well.. We discussed the benefit on the risks of Epo injection, especially the risk of thrombosis, including heart attack and stroke. She had a history of DVT in the past. I would consider starting Aranesp injection if her hemoglobin persistently below 10. -Her repeated ferritin and iron level has been normal. She will continue oral iron pill. -Follow-up CBC.   2. Chronic Renal Disease, stage 3 -Stable with creatinine around 1.3-1.6. Patient has been counseled to avoid nephrotoxins and to continue adequate hydration. Patient should continue with tight glycemic control and maintain a good blood pressure to decrease the chance further decline in renal function.   3. GERD/dysphagia.  --Patient had an EGD and a colonoscopy on 03/19/2013 by Dr. Olevia Perches. With iron index and her ferritin within normal limits, this is less likely to be resulting in her mild anemia.   4. History of DVT.  --Patient was informed that patients with a history of DVT or more prone to DVT recurrence. she denies any symptoms of swelling in the legs and she reports mobilization. She also denies any acute shortness of breath.   5. Hypertension  -Continue low salt diet and anti-hypertensive medications per PCP.   6. Follow-up  -Return to clinic in 1 year with repeated CBC and CMP, she is going to see her PCP Dr. Regis Bill in May 2018 and follows with her regularly    All questions were answered. The patient knows to call the clinic with any problems, questions or concerns. We can certainly see the patient much sooner if necessary.  I spent 15 minutes counseling the patient face to  face. The total time spent in the appointment was 20 minutes.  This document serves as a record of services personally performed by Truitt Merle, MD. It was created on her behalf by Arlyce Harman, a trained medical scribe. The creation of  this record is based on the scribe's personal observations and the provider's statements to them. This document has been checked and approved by the attending provider.  Truitt Merle  06/19/2016

## 2016-06-19 ENCOUNTER — Ambulatory Visit (HOSPITAL_BASED_OUTPATIENT_CLINIC_OR_DEPARTMENT_OTHER): Payer: Medicare Other | Admitting: Hematology

## 2016-06-19 ENCOUNTER — Telehealth: Payer: Self-pay | Admitting: Hematology

## 2016-06-19 ENCOUNTER — Other Ambulatory Visit: Payer: Self-pay | Admitting: Hematology

## 2016-06-19 ENCOUNTER — Other Ambulatory Visit (HOSPITAL_BASED_OUTPATIENT_CLINIC_OR_DEPARTMENT_OTHER): Payer: Medicare Other

## 2016-06-19 ENCOUNTER — Encounter: Payer: Self-pay | Admitting: Hematology

## 2016-06-19 VITALS — BP 156/63 | HR 60 | Temp 98.6°F | Resp 18 | Ht 61.0 in | Wt 183.6 lb

## 2016-06-19 DIAGNOSIS — N183 Chronic kidney disease, stage 3 unspecified: Secondary | ICD-10-CM

## 2016-06-19 DIAGNOSIS — I10 Essential (primary) hypertension: Secondary | ICD-10-CM | POA: Diagnosis not present

## 2016-06-19 DIAGNOSIS — D631 Anemia in chronic kidney disease: Secondary | ICD-10-CM

## 2016-06-19 DIAGNOSIS — K219 Gastro-esophageal reflux disease without esophagitis: Secondary | ICD-10-CM

## 2016-06-19 DIAGNOSIS — D638 Anemia in other chronic diseases classified elsewhere: Secondary | ICD-10-CM

## 2016-06-19 DIAGNOSIS — Z86718 Personal history of other venous thrombosis and embolism: Secondary | ICD-10-CM

## 2016-06-19 DIAGNOSIS — R1310 Dysphagia, unspecified: Secondary | ICD-10-CM

## 2016-06-19 LAB — CBC WITH DIFFERENTIAL/PLATELET
BASO%: 0.2 % (ref 0.0–2.0)
BASOS ABS: 0 10*3/uL (ref 0.0–0.1)
EOS ABS: 0.1 10*3/uL (ref 0.0–0.5)
EOS%: 1.6 % (ref 0.0–7.0)
HEMATOCRIT: 32.9 % — AB (ref 34.8–46.6)
HEMOGLOBIN: 10.6 g/dL — AB (ref 11.6–15.9)
LYMPH#: 0.9 10*3/uL (ref 0.9–3.3)
LYMPH%: 20.4 % (ref 14.0–49.7)
MCH: 27.9 pg (ref 25.1–34.0)
MCHC: 32.2 g/dL (ref 31.5–36.0)
MCV: 86.6 fL (ref 79.5–101.0)
MONO#: 0.4 10*3/uL (ref 0.1–0.9)
MONO%: 7.9 % (ref 0.0–14.0)
NEUT#: 3.1 10*3/uL (ref 1.5–6.5)
NEUT%: 69.9 % (ref 38.4–76.8)
Platelets: 194 10*3/uL (ref 145–400)
RBC: 3.8 10*6/uL (ref 3.70–5.45)
RDW: 13.6 % (ref 11.2–14.5)
WBC: 4.4 10*3/uL (ref 3.9–10.3)

## 2016-06-19 LAB — COMPREHENSIVE METABOLIC PANEL
ALK PHOS: 37 U/L — AB (ref 40–150)
ALT: 14 U/L (ref 0–55)
AST: 22 U/L (ref 5–34)
Albumin: 3.5 g/dL (ref 3.5–5.0)
Anion Gap: 9 mEq/L (ref 3–11)
BILIRUBIN TOTAL: 0.32 mg/dL (ref 0.20–1.20)
BUN: 26.6 mg/dL — AB (ref 7.0–26.0)
CALCIUM: 9.8 mg/dL (ref 8.4–10.4)
CO2: 31 mEq/L — ABNORMAL HIGH (ref 22–29)
Chloride: 96 mEq/L — ABNORMAL LOW (ref 98–109)
Creatinine: 1.4 mg/dL — ABNORMAL HIGH (ref 0.6–1.1)
EGFR: 43 mL/min/{1.73_m2} — ABNORMAL LOW (ref >90)
Glucose: 105 mg/dl (ref 70–140)
POTASSIUM: 3.8 meq/L (ref 3.5–5.1)
Sodium: 136 mEq/L (ref 136–145)
Total Protein: 6.6 g/dL (ref 6.4–8.3)

## 2016-06-19 NOTE — Telephone Encounter (Signed)
Appointments scheduled per 06/19/16 los. Patient was given a copy of the AVS report and appointment schedule per 06/19/16 los.

## 2016-06-19 NOTE — Telephone Encounter (Addendum)
I am not certain I understand   Generic tribenzoar  Is omestartan amlodipine and hctz   Ok to change to the generic verson of the same med  To get approved   olmesartan-amlodipine-hydrochlorothiazide, Let me know if that doesn't work

## 2016-06-20 MED ORDER — OLMESARTAN-AMLODIPINE-HCTZ 40-10-25 MG PO TABS: 1.0000 | ORAL_TABLET | Freq: Every day | ORAL | 3 refills | Status: DC

## 2016-06-20 NOTE — Addendum Note (Signed)
Addended by: Miles Costain T on: 06/20/2016 11:20 AM   Modules accepted: Orders

## 2016-06-20 NOTE — Telephone Encounter (Signed)
Generic medication sent to the pharmacy.

## 2016-06-21 ENCOUNTER — Other Ambulatory Visit: Payer: Self-pay | Admitting: Internal Medicine

## 2016-06-21 NOTE — Telephone Encounter (Signed)
Ok to refill x 1  

## 2016-06-22 ENCOUNTER — Encounter: Payer: Self-pay | Admitting: Internal Medicine

## 2016-06-22 NOTE — Telephone Encounter (Signed)
Called to the pharmacy and left on machine. 

## 2016-07-18 ENCOUNTER — Other Ambulatory Visit: Payer: Self-pay | Admitting: Internal Medicine

## 2016-07-18 DIAGNOSIS — Z1231 Encounter for screening mammogram for malignant neoplasm of breast: Secondary | ICD-10-CM

## 2016-07-26 ENCOUNTER — Telehealth (INDEPENDENT_AMBULATORY_CARE_PROVIDER_SITE_OTHER): Payer: Self-pay | Admitting: Physical Medicine and Rehabilitation

## 2016-07-27 NOTE — Telephone Encounter (Signed)
Message sent through [facility] to Dr. [name] asking if patient can hold Plavix for 7 days before the injection.

## 2016-07-27 NOTE — Telephone Encounter (Signed)
If last injection more than 50% relief then ok to repeat, approval of plavix and insurance needed

## 2016-07-31 NOTE — Telephone Encounter (Signed)
Scheduled for 08/08/16 at 1500.

## 2016-07-31 NOTE — Telephone Encounter (Signed)
Received ok from Dr. Regis Bill for patient to hold Plavix x7 days. Called patient and left a message for her to call back to schedule.

## 2016-08-04 ENCOUNTER — Other Ambulatory Visit: Payer: Self-pay | Admitting: Internal Medicine

## 2016-08-08 ENCOUNTER — Encounter (INDEPENDENT_AMBULATORY_CARE_PROVIDER_SITE_OTHER): Payer: Self-pay | Admitting: Physical Medicine and Rehabilitation

## 2016-08-10 ENCOUNTER — Encounter (INDEPENDENT_AMBULATORY_CARE_PROVIDER_SITE_OTHER): Payer: Self-pay | Admitting: Physical Medicine and Rehabilitation

## 2016-08-10 ENCOUNTER — Ambulatory Visit (INDEPENDENT_AMBULATORY_CARE_PROVIDER_SITE_OTHER): Payer: Self-pay

## 2016-08-10 ENCOUNTER — Ambulatory Visit (INDEPENDENT_AMBULATORY_CARE_PROVIDER_SITE_OTHER): Payer: Medicare Other | Admitting: Physical Medicine and Rehabilitation

## 2016-08-10 ENCOUNTER — Telehealth (INDEPENDENT_AMBULATORY_CARE_PROVIDER_SITE_OTHER): Payer: Self-pay | Admitting: Physical Medicine and Rehabilitation

## 2016-08-10 VITALS — BP 155/64 | HR 72 | Temp 99.0°F

## 2016-08-10 DIAGNOSIS — M5416 Radiculopathy, lumbar region: Secondary | ICD-10-CM | POA: Diagnosis not present

## 2016-08-10 MED ORDER — LIDOCAINE HCL (PF) 1 % IJ SOLN
0.3300 mL | Freq: Once | INTRAMUSCULAR | Status: AC
  Administered 2016-08-10: 0.3 mL

## 2016-08-10 MED ORDER — METHYLPREDNISOLONE ACETATE 80 MG/ML IJ SUSP
80.0000 mg | Freq: Once | INTRAMUSCULAR | Status: AC
  Administered 2016-08-10: 80 mg

## 2016-08-10 NOTE — Telephone Encounter (Signed)
-----   Message from Burnis Medin, MD sent at 07/28/2016 12:59 PM EDT ----- Regarding: RE: Plavix Yes WP ----- Message ----- From: Sherre Scarlet, RT Sent: 07/27/2016   9:08 AM To: Burnis Medin, MD Subject: Plavix                                         Patient is needing to schedule an ESI with Dr. Ernestina Patches at [facility]. She will need to hold Plavix for 7 days prior to the injection. Will this be ok?

## 2016-08-10 NOTE — Patient Instructions (Signed)

## 2016-08-10 NOTE — Progress Notes (Signed)
LACOYA WILBANKS - 83 y.o. female MRN [id number]  Date of birth: 1933-12-04  Office Visit Note: Visit Date: 08/10/2016 PCP: [name], MD Referred by: Burnis Medin, MD  Subjective: Chief Complaint  Patient presents with  . Lower Back - Pain   HPI: Mrs. Nierman is very pleasant 83 year old female with worsening chronic low back pain worsening for around 1 month. Worse on left side. Pain and numbness radiating down left leg to foot.  Pain into right thigh as well. She comes in today for planned repeat of epidural injection at L5-S1. Last injection seemed to help quite a bit. MRI evidence of mostly facet arthropathy with some listhesis but no central canal stenosis.  Stopped taking Coumadin 08/03/16    ROS Otherwise per HPI.  Assessment & Plan: Visit Diagnoses:  1. Lumbar radiculopathy     Plan: Findings:  Left L5-S1 interlaminar epidural steroid injection.    Meds & Orders:  Meds ordered this encounter  Medications  . lidocaine (PF) (XYLOCAINE) 1 % injection 0.3 mL  . methylPREDNISolone acetate (DEPO-MEDROL) injection 80 mg    Orders Placed This Encounter  Procedures  . XR C-ARM NO REPORT  . Epidural Steroid injection    Follow-up: Return if symptoms worsen or fail to improve.   Procedures: No procedures performed  Lumbar Epidural Steroid Injection - Interlaminar Approach with Fluoroscopic Guidance  Patient: CAMI DELAWDER      Date of Birth: 17-Dec-1933 MRN: [id number] PCP: [name], MD      Visit Date: 08/10/2016   Universal Protocol:    Date/Time: 03/30/187:11 AM  Consent Given By: the patient  Position: PRONE  Additional Comments: Vital signs were monitored before and after the procedure. Patient was prepped and draped in the usual sterile fashion. The correct patient, procedure, and site was verified.   Injection Procedure Details:  Procedure Site One Meds Administered:  Meds ordered this encounter  Medications  . lidocaine  (PF) (XYLOCAINE) 1 % injection 0.3 mL  . methylPREDNISolone acetate (DEPO-MEDROL) injection 80 mg     Laterality: Left  Location/Site:  L4-L5  Needle size: 20 G  Needle type: Tuohy  Needle Placement: Paramedian epidural  Findings:  -Contrast Used: 1 mL iohexol 180 mg iodine/mL   -Comments: Excellent flow of contrast into the epidural space.  Procedure Details: Using a paramedian approach from the side mentioned above, the region overlying the inferior lamina was localized under fluoroscopic visualization and the soft tissues overlying this structure were infiltrated with 4 ml. of 1% Lidocaine without Epinephrine. The Tuohy needle was inserted into the epidural space using a paramedian approach.   The epidural space was localized using loss of resistance along with lateral and bi-planar fluoroscopic views.  After negative aspirate for air, blood, and CSF, a 2 ml. volume of Isovue-250 was injected into the epidural space and the flow of contrast was observed. Radiographs were obtained for documentation purposes.    The injectate was administered into the level noted above.   Additional Comments:  The patient tolerated the procedure well Dressing: Band-Aid    Post-procedure details: Patient was observed during the procedure. Post-procedure instructions were reviewed.  Patient left the clinic in stable condition.    Clinical History: No specialty comments available.  She reports that she has never smoked. She has never used smokeless tobacco.   Recent Labs  12/27/15 1202 04/25/16 1028  HGBA1C 6.3 6.0    Objective:  VS:  HT:    WT:   BMI:  BP:(!) 155/64  HR:72bpm  TEMP:99 F (37.2 C)(Oral)  RESP:98 % Physical Exam  Musculoskeletal:  Patient ambulates without aid she is slow to rise from a seated position. She has good distal strength of the lower extremities. She has no hip pain with rotation.    Ortho Exam Imaging: Xr C-arm No Report  Result Date:  08/10/2016 Please see Notes or Procedures tab for imaging impression.   Past Medical/Family/Surgical/Social History: Medications & Allergies reviewed per EMR Patient Active Problem List   Diagnosis Date Noted  . Renal cyst 03/27/2016  . CKD (chronic kidney disease) stage 3, GFR 30-59 ml/min 08/23/2015  . Acute respiratory infection 07/13/2014  . Perceived hearing changes 06/29/2014  . Vertigo 06/29/2014  . Resistant hypertension 06/29/2014  . Lumbago 06/15/2014  . Pre-diabetes vs early DM  06/15/2014  . Dyspnea 04/27/2014  . Asthma, chronic 04/13/2014  . Elevated uric acid in blood 04/13/2014  . Acute superficial venous thrombosis of left lower extremity 03/27/2014  . Essential hypertension 03/27/2014  . History of DVT (deep vein thrombosis)   . Palpitations 01/05/2014  . Ankle pain 01/05/2014  . Anemia of chronic disease 26-Dec-2013  . Death of family member 2013/08/30  . Leg edema 06/20/2013  . Bereavement due to life event 04/21/2013  . Other dysphagia 02/11/2013  . Colon cancer screening 02/11/2013  . Headache(784.0) 01/20/2013  . Anxiety state 01/20/2013  . Leg cramps 01/20/2013  . Back pain 12/05/2012  . Family hx of colon cancer 10/21/2012  . Family hx of lung cancer 10/21/2012  . Family hx-breast malignancy 10/21/2012  . Renal insufficiency creatinine 1.3 10/21/2012  . Anemia, chronic disease 10/21/2012  . Chest pain, atypical 09/08/2012  . GERD (gastroesophageal reflux disease) 09/08/2012  . Depression 09/08/2012  . HTN (hypertension) 09/08/2012  . Hyperlipidemia 09/08/2012  . Asthma 09/08/2012  . Varicose veins of leg with complications 59/56/3875   Past Medical History:  Diagnosis Date  . Allergy   . Anemia   . Anxiety   . Arthritis    "shoulders" (09/09/2012)  . Asthma    " onset when young"   . Diabetes mellitus without complication ([place])    "Borderline" per pt; being monitored  . GERD (gastroesophageal reflux disease)   . Headache(784.0)     "related to my high blood pressure" (09/09/2012)  . History of DVT (deep vein thrombosis)    "RLE" (09/09/2012) coumadine cant take asa so on plavix   . Hyperlipidemia   . Hypertension   . Lupus    "cured years ago" (09/09/2012)  . Varicose veins    Family History  Problem Relation Age of Onset  . Hypertension Mother   . Hypertension Father   . Cancer Sister     Colon and Breast Cancer  . Hypertension Brother   . Heart attack Daughter   . Lung cancer      both parents    Past Surgical History:  Procedure Laterality Date  . ABDOMINAL HYSTERECTOMY     partial  . BREAST SURGERY    . CARPAL TUNNEL RELEASE Right 2000's  . CARPAL TUNNEL RELEASE Bilateral 10/21/2013   Procedure: BILATERAL  CARPAL TUNNEL RELEASE;  Surgeon: [name], MD;  Location: [facility];  Service: Orthopedics;  Laterality: Bilateral;  . SHOULDER OPEN ROTATOR CUFF REPAIR Bilateral 2000's  . TRIGGER FINGER RELEASE Right 10/21/2013   Procedure: RELEASE TRIGGER FINGER/A-1 PULLEY RIGHT MIDDLE AND RIGHT RING;  Surgeon: [name], MD;  Location: [facility];  Service: Orthopedics;  Laterality: Right;   Social History   Occupational History  . Not on file.   Social History Main Topics  . Smoking status: Never Smoker  . Smokeless tobacco: Never Used  . Alcohol use No  . Drug use: No  . Sexual activity: Not on file

## 2016-08-11 ENCOUNTER — Ambulatory Visit
Admission: RE | Admit: 2016-08-11 | Discharge: 2016-08-11 | Disposition: A | Discharge status: Home / Self Care | Payer: Medicare Other | Source: Ambulatory Visit | Attending: Internal Medicine | Admitting: Internal Medicine

## 2016-08-11 DIAGNOSIS — Z1231 Encounter for screening mammogram for malignant neoplasm of breast: Secondary | ICD-10-CM | POA: Diagnosis not present

## 2016-08-11 NOTE — Procedures (Signed)
Lumbar Epidural Steroid Injection - Interlaminar Approach with Fluoroscopic Guidance  Patient: [name]      Date of Birth: December 09, 1933 MRN: [id number] PCP: [name], MD      Visit Date: 08/10/2016   Universal Protocol:    Date/Time: 03/30/187:11 AM  Consent Given By: the patient  Position: PRONE  Additional Comments: Vital signs were monitored before and after the procedure. Patient was prepped and draped in the usual sterile fashion. The correct patient, procedure, and site was verified.   Injection Procedure Details:  Procedure Site One Meds Administered:  Meds ordered this encounter  Medications  . lidocaine (PF) (XYLOCAINE) 1 % injection 0.3 mL  . methylPREDNISolone acetate (DEPO-MEDROL) injection 80 mg     Laterality: Left  Location/Site:  L4-L5  Needle size: 20 G  Needle type: Tuohy  Needle Placement: Paramedian epidural  Findings:  -Contrast Used: 1 mL iohexol 180 mg iodine/mL   -Comments: Excellent flow of contrast into the epidural space.  Procedure Details: Using a paramedian approach from the side mentioned above, the region overlying the inferior lamina was localized under fluoroscopic visualization and the soft tissues overlying this structure were infiltrated with 4 ml. of 1% Lidocaine without Epinephrine. The Tuohy needle was inserted into the epidural space using a paramedian approach.   The epidural space was localized using loss of resistance along with lateral and bi-planar fluoroscopic views.  After negative aspirate for air, blood, and CSF, a 2 ml. volume of Isovue-250 was injected into the epidural space and the flow of contrast was observed. Radiographs were obtained for documentation purposes.    The injectate was administered into the level noted above.   Additional Comments:  The patient tolerated the procedure well Dressing: Band-Aid    Post-procedure details: Patient was observed during the  procedure. Post-procedure instructions were reviewed.  Patient left the clinic in stable condition.

## 2016-08-18 DIAGNOSIS — H2512 Age-related nuclear cataract, left eye: Secondary | ICD-10-CM | POA: Diagnosis not present

## 2016-08-18 DIAGNOSIS — H2513 Age-related nuclear cataract, bilateral: Secondary | ICD-10-CM | POA: Diagnosis not present

## 2016-08-18 DIAGNOSIS — H40023 Open angle with borderline findings, high risk, bilateral: Secondary | ICD-10-CM | POA: Diagnosis not present

## 2016-08-18 DIAGNOSIS — H25013 Cortical age-related cataract, bilateral: Secondary | ICD-10-CM | POA: Diagnosis not present

## 2016-08-18 DIAGNOSIS — H35373 Puckering of macula, bilateral: Secondary | ICD-10-CM | POA: Diagnosis not present

## 2016-08-22 ENCOUNTER — Other Ambulatory Visit: Payer: Self-pay | Admitting: Internal Medicine

## 2016-08-24 ENCOUNTER — Other Ambulatory Visit: Payer: Self-pay

## 2016-08-24 MED ORDER — LORAZEPAM 1 MG PO TABS: ORAL_TABLET | ORAL | 1 refills | Status: DC

## 2016-08-24 NOTE — Telephone Encounter (Signed)
Ok to refill lorazepam x 1

## 2016-08-24 NOTE — Telephone Encounter (Signed)
Rx called in as directed.   

## 2016-09-14 ENCOUNTER — Other Ambulatory Visit: Payer: Self-pay | Admitting: Internal Medicine

## 2016-09-20 ENCOUNTER — Other Ambulatory Visit: Payer: Self-pay | Admitting: Internal Medicine

## 2016-09-22 ENCOUNTER — Other Ambulatory Visit: Payer: Self-pay | Admitting: Internal Medicine

## 2016-09-26 ENCOUNTER — Other Ambulatory Visit: Payer: Self-pay | Admitting: Internal Medicine

## 2016-09-30 ENCOUNTER — Other Ambulatory Visit: Payer: Self-pay | Admitting: Internal Medicine

## 2016-10-01 ENCOUNTER — Ambulatory Visit (HOSPITAL_COMMUNITY)
Admission: EM | Admit: 2016-10-01 | Discharge: 2016-10-01 | Disposition: A | Discharge status: Home / Self Care | Payer: Medicare Other | Attending: Internal Medicine | Admitting: Internal Medicine

## 2016-10-01 ENCOUNTER — Encounter (HOSPITAL_COMMUNITY): Payer: Self-pay | Admitting: *Deleted

## 2016-10-01 DIAGNOSIS — L03119 Cellulitis of unspecified part of limb: Secondary | ICD-10-CM

## 2016-10-01 MED ORDER — DOXYCYCLINE HYCLATE 100 MG PO CAPS: 100.0000 mg | ORAL_CAPSULE | Freq: Two times a day (BID) | ORAL | 0 refills | Status: DC

## 2016-10-01 NOTE — Discharge Instructions (Signed)
Treating you for cellulitis tonight, I have prescribed a medicine called doxycycline, take one tablet twice a day. I also recommend wearing compression stockings to help reduce the swelling, or alternatively wrapped your legs with a product called Coban. I would like you to schedule an appointment with your primary care provider in one week for reevaluation to ensure you are recovering well.

## 2016-10-01 NOTE — ED Provider Notes (Signed)
CSN: [id number]     Arrival date & time 10/01/16  1657 History   First MD Initiated Contact with Patient 10/01/16 1830     Chief Complaint  Patient presents with  . Leg Pain   (Consider location/radiation/quality/duration/timing/severity/associated sxs/prior Treatment) 83 year old female with a past medical history of diabetes, reflux, DVT, along with hypertension presents to clinic with a 4 day history of bilateral leg swelling, and pain, along with warmth and intense pain to the right leg. She denies any fever or chills, but has said she is having a loss of appetite, and hasn't felt well. She is on an "fluid pill" prescribed by her primary care provider, however it has not appeared to help over the last few days. She is also prescribed compression stockings as well, but has not been using them.   The history is provided by the patient.  Leg Pain  Associated symptoms: fatigue   Associated symptoms: no fever     Past Medical History:  Diagnosis Date  . Allergy   . Anemia   . Anxiety   . Arthritis    "shoulders" (09/09/2012)  . Asthma    " onset when young"   . Diabetes mellitus without complication ([place])    "Borderline" per pt; being monitored  . GERD (gastroesophageal reflux disease)   . Headache(784.0)    "related to my high blood pressure" (09/09/2012)  . History of DVT (deep vein thrombosis)    "RLE" (09/09/2012) coumadine cant take asa so on plavix   . Hyperlipidemia   . Hypertension   . Lupus    "cured years ago" (09/09/2012)  . Varicose veins    Past Surgical History:  Procedure Laterality Date  . ABDOMINAL HYSTERECTOMY     partial  . BREAST EXCISIONAL BIOPSY Right   . BREAST SURGERY    . CARPAL TUNNEL RELEASE Right 2000's  . CARPAL TUNNEL RELEASE Bilateral 10/21/2013   Procedure: BILATERAL  CARPAL TUNNEL RELEASE;  Surgeon: [name], MD;  Location: [facility];  Service: Orthopedics;  Laterality: Bilateral;  . SHOULDER OPEN ROTATOR CUFF REPAIR  Bilateral 2000's  . TRIGGER FINGER RELEASE Right 10/21/2013   Procedure: RELEASE TRIGGER FINGER/A-1 PULLEY RIGHT MIDDLE AND RIGHT RING;  Surgeon: [name], MD;  Location: [facility];  Service: Orthopedics;  Laterality: Right;   Family History  Problem Relation Age of Onset  . Hypertension Mother   . Breast cancer Mother   . Hypertension Father   . Cancer Sister        Colon and Breast Cancer  . Breast cancer Sister   . Hypertension Brother   . Heart attack Daughter   . Lung cancer Unknown        both parents    Social History  Substance Use Topics  . Smoking status: Never Smoker  . Smokeless tobacco: Never Used  . Alcohol use No   OB History    Gravida Para Term Preterm AB Living   7 7           SAB TAB Ectopic Multiple Live Births                  Obstetric Comments   Lost 2 children soon after birth     Review of Systems  Constitutional: Positive for appetite change and fatigue. Negative for chills and fever.  HENT: Negative.   Respiratory: Negative.   Cardiovascular: Negative.   Gastrointestinal: Negative.   Musculoskeletal: Negative.   Skin:  Negative.   Neurological: Negative.     Allergies  Penicillins and Aspirin  Home Medications   Prior to Admission medications   Medication Sig Start Date End Date Taking? Authorizing Provider  ADVAIR DISKUS 250-50 MCG/DOSE AEPB USE 1 PUFF TWICE DAILY. 09/26/16  Yes Panosh, Standley Brooking, MD  albuterol (PROAIR HFA) 108 (90 Base) MCG/ACT inhaler USE 2 PUFFS EVERY 6 HOURS AS NEEDED FOR WHEEZING. 05/05/16  Yes Panosh, Standley Brooking, MD  albuterol (PROVENTIL) (2.5 MG/3ML) 0.083% nebulizer solution USE 1 AMPULE EVERY 6 HOURS AS NEEDED FOR WHEEZE 09/20/16  Yes Panosh, Standley Brooking, MD  citalopram (CELEXA) 10 MG tablet TAKE 1&1/2 TABLETS DAILY FOR ANXIETY/PANIC. 04/24/16  Yes Panosh, Standley Brooking, MD  clopidogrel (PLAVIX) 75 MG tablet TAKE 1 TABLET ONCE DAILY. 05/02/16  Yes Panosh, Standley Brooking, MD  diclofenac sodium (VOLTAREN) 1 % GEL  APPLY 4GM TO AFFECTED AREA(S) 4 TIMES A DAY. 05/19/16  Yes Panosh, Standley Brooking, MD  doxazosin (CARDURA) 2 MG tablet TAKE 3 TABLETS AT BEDTIME. 09/14/16  Yes Panosh, Standley Brooking, MD  fenofibrate 160 MG tablet TAKE 1 TABLET ONCE DAILY. 08/22/16  Yes Panosh, Standley Brooking, MD  ferrous sulfate 325 (65 FE) MG EC tablet Take 1 tablet (325 mg total) by mouth daily with breakfast. 12/15/14  Yes Panosh, Standley Brooking, MD  furosemide (LASIX) 20 MG tablet TAKE 1 TABLET TWICE DAILY. 03/27/16  Yes Panosh, Standley Brooking, MD  metoprolol succinate (TOPROL-XL) 50 MG 24 hr tablet TAKE 1 TABLET ONCE DAILY WITH FOOD. 05/24/16  Yes Panosh, Standley Brooking, MD  montelukast (SINGULAIR) 10 MG tablet TAKE 1 TABLET ONCE DAILY. 11/03/15  Yes Panosh, Standley Brooking, MD  Olmesartan-Amlodipine-HCTZ 40-10-25 MG TABS Take 1 tablet by mouth daily. 06/20/16  Yes Panosh, Standley Brooking, MD  pantoprazole (PROTONIX) 40 MG tablet TAKE 1 TABLET ONCE DAILY. 05/24/16  Yes Panosh, Standley Brooking, MD  potassium chloride (K-DUR) 10 MEQ tablet TAKE 4 TABLETS DAILY. 05/19/16  Yes Panosh, Standley Brooking, MD  RESTASIS 0.05 % ophthalmic emulsion  01/01/15  Yes [provider]  Spacer/Aero-Holding Chambers (AEROCHAMBER PLUS) inhaler Use as instructed 05/26/16  Yes Melynda Ripple, MD  vitamin B-12 (CYANOCOBALAMIN) 1000 MCG tablet Take 1,000 mcg by mouth daily.   Yes [provider]  acetaminophen (TYLENOL) 500 MG tablet Take 1,000 mg by mouth every 6 (six) hours as needed for moderate pain. Reported on 04/30/2015    [provider]  doxycycline (VIBRAMYCIN) 100 MG capsule Take 1 capsule (100 mg total) by mouth 2 (two) times daily. 10/01/16   Barnet Glasgow, NP   Meds Ordered and Administered this Visit  Medications - No data to display  BP (!) 165/47   Pulse 66   Temp 98.9 F (37.2 C) (Oral)   Resp 18   SpO2 99%  No data found.   Physical Exam  Constitutional: She appears well-developed and well-nourished.  HENT:  Head: Normocephalic.  Right Ear: External ear normal.  Left Ear:  External ear normal.  Eyes: Conjunctivae are normal.  Neck: Normal range of motion.  Cardiovascular: Normal rate and regular rhythm.   Pulmonary/Chest: Effort normal and breath sounds normal.  Musculoskeletal: She exhibits edema.  Bilateral notable +3 edema both legs, right leg erythemic, and notable warmth to touch.  Neurological: She is alert.  Skin: Skin is warm. Capillary refill takes less than 2 seconds. There is erythema.  Nursing note and vitals reviewed.   Urgent Care Course     Procedures (including critical care time)  Labs  Review Labs Reviewed - No data to display  Imaging Review No results found.     MDM   1. Cellulitis of lower extremity, unspecified laterality    Treating for cellulitis, recommend compression stockings or wrapping with [place]. Recommend patient follow up with PCP in one week for recheck.    Barnet Glasgow, NP 10/01/16 1851

## 2016-10-01 NOTE — ED Triage Notes (Signed)
C/O right distal lower leg pain 4 days ago, which improved, then started returning and getting worse.  Redness and swelling noted to area.  Denies fevers.

## 2016-10-02 ENCOUNTER — Telehealth: Payer: Self-pay | Admitting: Internal Medicine

## 2016-10-02 NOTE — Telephone Encounter (Signed)
Pt need new Rx for ADVAIR DISKUS 250-50  Pharm:  [facility].

## 2016-10-03 ENCOUNTER — Other Ambulatory Visit: Payer: Self-pay | Admitting: Internal Medicine

## 2016-10-03 ENCOUNTER — Other Ambulatory Visit: Payer: Self-pay | Admitting: Emergency Medicine

## 2016-10-03 MED ORDER — FLUTICASONE-SALMETEROL 250-50 MCG/DOSE IN AEPB: INHALATION_SPRAY | RESPIRATORY_TRACT | 0 refills | Status: DC

## 2016-10-03 NOTE — Telephone Encounter (Signed)
Medication has been sent to the pharmacy. 

## 2016-10-06 ENCOUNTER — Ambulatory Visit (INDEPENDENT_AMBULATORY_CARE_PROVIDER_SITE_OTHER): Payer: Medicare Other | Admitting: Internal Medicine

## 2016-10-06 VITALS — BP 140/70 | HR 69 | Temp 98.2°F | Ht 61.0 in | Wt 183.2 lb

## 2016-10-06 DIAGNOSIS — D638 Anemia in other chronic diseases classified elsewhere: Secondary | ICD-10-CM

## 2016-10-06 DIAGNOSIS — N183 Chronic kidney disease, stage 3 unspecified: Secondary | ICD-10-CM

## 2016-10-06 DIAGNOSIS — J45909 Unspecified asthma, uncomplicated: Secondary | ICD-10-CM | POA: Diagnosis not present

## 2016-10-06 DIAGNOSIS — R7301 Impaired fasting glucose: Secondary | ICD-10-CM | POA: Diagnosis not present

## 2016-10-06 DIAGNOSIS — R609 Edema, unspecified: Secondary | ICD-10-CM | POA: Diagnosis not present

## 2016-10-06 DIAGNOSIS — N289 Disorder of kidney and ureter, unspecified: Secondary | ICD-10-CM

## 2016-10-06 DIAGNOSIS — I10 Essential (primary) hypertension: Secondary | ICD-10-CM

## 2016-10-06 LAB — BASIC METABOLIC PANEL
BUN: 33 mg/dL — ABNORMAL HIGH (ref 6–23)
CALCIUM: 9.7 mg/dL (ref 8.4–10.5)
CHLORIDE: 101 meq/L (ref 96–112)
CO2: 32 mEq/L (ref 19–32)
CREATININE: 1.66 mg/dL — AB (ref 0.40–1.20)
GFR: 38.16 mL/min — ABNORMAL LOW (ref >60.00)
GLUCOSE: 107 mg/dL — AB (ref 70–99)
Potassium: 3.7 mEq/L (ref 3.5–5.1)
Sodium: 140 mEq/L (ref 135–145)

## 2016-10-06 LAB — CBC WITH DIFFERENTIAL/PLATELET
BASOS ABS: 0 10*3/uL (ref 0.0–0.1)
Basophils Relative: 0.8 % (ref 0.0–3.0)
EOS PCT: 2.2 % (ref 0.0–5.0)
Eosinophils Absolute: 0.1 10*3/uL (ref 0.0–0.7)
HEMATOCRIT: 31 % — AB (ref 36.0–46.0)
HEMOGLOBIN: 10.3 g/dL — AB (ref 12.0–15.0)
LYMPHS PCT: 29.4 % (ref 12.0–46.0)
Lymphs Abs: 1.2 10*3/uL (ref 0.7–4.0)
MCHC: 33.2 g/dL (ref 30.0–36.0)
MCV: 87.1 fl (ref 78.0–100.0)
MONOS PCT: 7.9 % (ref 3.0–12.0)
Monocytes Absolute: 0.3 10*3/uL (ref 0.1–1.0)
Neutro Abs: 2.5 10*3/uL (ref 1.4–7.7)
Neutrophils Relative %: 59.7 % (ref 43.0–77.0)
Platelets: 240 10*3/uL (ref 150.0–400.0)
RBC: 3.56 Mil/uL — AB (ref 3.87–5.11)
RDW: 13.2 % (ref 11.5–15.5)
WBC: 4.2 10*3/uL (ref 4.0–10.5)

## 2016-10-06 LAB — HEMOGLOBIN A1C: Hgb A1c MFr Bld: 6.2 % (ref 4.6–6.5)

## 2016-10-06 MED ORDER — FUROSEMIDE 20 MG PO TABS: 20.0000 mg | ORAL_TABLET | Freq: Two times a day (BID) | ORAL | 5 refills | Status: DC

## 2016-10-06 NOTE — Progress Notes (Signed)
Chief Complaint  Patient presents with  . Follow-up    HPI: [name] 83 y.o. come in for Chronic disease management   anemia of CD sees heme   And of hg drops considering  rx   Back pain with radiculopathy   Ed visit for cellulitis  4 days ago bilateral? Redness and pina getting better mostly r more than left   Swelling compression stocking and bid lasix 20  ? If getting better   resp stable on inhalers   Bp not check recnetly at home  Last reaml dr Hollie Salk December  adfvise change lopressor to carvedilol if not controlled .  NO recent falling   ROS: See pertinent positives and negatives per HPI.  Past Medical History:  Diagnosis Date  . Allergy   . Anemia   . Anxiety   . Arthritis    "shoulders" (09/09/2012)  . Asthma    " onset when young"   . Diabetes mellitus without complication ([place])    "Borderline" per pt; being monitored  . GERD (gastroesophageal reflux disease)   . Headache(784.0)    "related to my high blood pressure" (09/09/2012)  . History of DVT (deep vein thrombosis)    "RLE" (09/09/2012) coumadine cant take asa so on plavix   . Hyperlipidemia   . Hypertension   . Lupus    "cured years ago" (09/09/2012)  . Varicose veins     Family History  Problem Relation Age of Onset  . Hypertension Mother   . Breast cancer Mother   . Hypertension Father   . Cancer Sister        Colon and Breast Cancer  . Breast cancer Sister   . Hypertension Brother   . Heart attack Daughter   . Lung cancer Unknown        both parents     Social History   Social History  . Marital status: Single    Spouse name: N/A  . Number of children: N/A  . Years of education: N/A   Social History Main Topics  . Smoking status: Never Smoker  . Smokeless tobacco: Never Used  . Alcohol use No  . Drug use: No  . Sexual activity: Not on file   Other Topics Concern  . Not on file   Social History Narrative   2 people living in the home.  Grand daughter.   Up and down  through the night      Had 7 children  2 deceased . 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    works 3 days per week currently .   Glasses dentures  Neg tad     Outpatient Medications Prior to Visit  Medication Sig Dispense Refill  . acetaminophen (TYLENOL) 500 MG tablet Take 1,000 mg by mouth every 6 (six) hours as needed for moderate pain. Reported on 04/30/2015    . albuterol (PROAIR HFA) 108 (90 Base) MCG/ACT inhaler USE 2 PUFFS EVERY 6 HOURS AS NEEDED FOR WHEEZING. 8.5 g 4  . albuterol (PROVENTIL) (2.5 MG/3ML) 0.083% nebulizer solution USE 1 AMPULE EVERY 6 HOURS AS NEEDED FOR WHEEZE 75 mL 1  . citalopram (CELEXA) 10 MG tablet TAKE 1&1/2 TABLETS DAILY FOR ANXIETY/PANIC. 45 tablet 3  . clopidogrel (PLAVIX) 75 MG tablet TAKE 1 TABLET ONCE DAILY. 30 tablet 5  . diclofenac sodium (VOLTAREN) 1 % GEL APPLY 4GM TO AFFECTED  AREA(S) 4 TIMES A DAY. 100 g 1  . doxazosin (CARDURA) 2 MG tablet TAKE 3 TABLETS AT BEDTIME. 90 tablet 2  . doxycycline (VIBRAMYCIN) 100 MG capsule Take 1 capsule (100 mg total) by mouth 2 (two) times daily. 20 capsule 0  . fenofibrate 160 MG tablet TAKE 1 TABLET ONCE DAILY. 90 tablet 0  . ferrous sulfate 325 (65 FE) MG EC tablet Take 1 tablet (325 mg total) by mouth daily with breakfast. 30 tablet 3  . Fluticasone-Salmeterol (ADVAIR DISKUS) 250-50 MCG/DOSE AEPB USE 1 PUFF TWICE DAILY. 60 each 0  . metoprolol succinate (TOPROL-XL) 50 MG 24 hr tablet TAKE 1 TABLET ONCE DAILY WITH FOOD. 30 tablet 4  . montelukast (SINGULAIR) 10 MG tablet TAKE 1 TABLET ONCE DAILY. 90 tablet 3  . Olmesartan-Amlodipine-HCTZ 40-10-25 MG TABS Take 1 tablet by mouth daily. 30 tablet 3  . pantoprazole (PROTONIX) 40 MG tablet TAKE 1 TABLET ONCE DAILY. 30 tablet 4  . potassium chloride (K-DUR) 10 MEQ tablet TAKE 4 TABLETS DAILY. 120 tablet 4  . RESTASIS 0.05 % ophthalmic emulsion     . Spacer/Aero-Holding Chambers  (AEROCHAMBER PLUS) inhaler Use as instructed 1 each 2  . vitamin B-12 (CYANOCOBALAMIN) 1000 MCG tablet Take 1,000 mcg by mouth daily.    . furosemide (LASIX) 20 MG tablet TAKE 1 TABLET TWICE DAILY. 60 tablet 5   No facility-administered medications prior to visit.      EXAM:  BP 140/70 (BP Location: Right Arm, Patient Position: Sitting, Cuff Size: Normal)   Pulse 69   Temp 98.2 F (36.8 C) (Oral)   Ht 5\' 1"  (1.549 m)   Wt 183 lb 3.2 oz (83.1 kg)   BMI 34.62 kg/m   Body mass index is 34.62 kg/m. Recheck bp large cuff  138/70 right  GENERAL: vitals reviewed and listed above, alert, oriented, appears well hydrated and in no acute distress mild hoarseness no cough  HEENT: atraumatic, conjunctiva  clear, no obvious abnormalities on inspection of external nose and earse  NECK: no obvious masses on inspection palpation  LUNGS: clear to auscultation bilaterally, no wheezes, rales or rhonchi, good air movement CV: HRRR, no clubbing cyanosis 2-3 peripheral edema nl cap refill  MS: moves all extremities without noticeable focal  Abnormality  rle msome wramth and fading redness  Right no ulcer   Or lesion  PSYCH: pleasant and cooperative, no obvious depression or anxiety Lab Results  Component Value Date   WBC 4.2 10/06/2016   HGB 10.3 (L) 10/06/2016   HCT 31.0 (L) 10/06/2016   PLT 240.0 10/06/2016   GLUCOSE 107 (H) 10/06/2016   CHOL 182 04/25/2016   TRIG 83.0 04/25/2016   HDL 69.70 04/25/2016   LDLCALC 96 04/25/2016   ALT 14 06/19/2016   AST 22 06/19/2016   NA 140 10/06/2016   K 3.7 10/06/2016   CL 101 10/06/2016   CREATININE 1.66 (H) 10/06/2016   BUN 33 (H) 10/06/2016   CO2 32 10/06/2016   TSH 2.29 12/27/2015   INR 0.94 12/22/2009   HGBA1C 6.2 10/06/2016   MICROALBUR 10.7 (H) 04/25/2016   BP Readings from Last 3 Encounters:  10/06/16 140/70  10/01/16 (!) 165/47  08/10/16 (!) 155/64   Wt Readings from Last 3 Encounters:  10/06/16 183 lb 3.2 oz (83.1 kg)  06/19/16 183  lb 9.6 oz (83.3 kg)  05/05/16 182 lb (82.6 kg)    ASSESSMENT AND PLAN:  Discussed the following assessment and plan:  Essential hypertension - Plan: CBC  with Differential/Platelet, Basic metabolic panel, Hemoglobin A1c  Renal insufficiency creatinine 1.3 - Plan: CBC with Differential/Platelet, Basic metabolic panel, Hemoglobin A1c  Chronic asthma without complication, unspecified asthma severity, unspecified whether persistent  CKD (chronic kidney disease) stage 3, GFR 30-59 ml/min  Fasting hyperglycemia - Plan: CBC with Differential/Platelet, Basic metabolic panel, Hemoglobin A1c  Edema, unspecified type - Plan: CBC with Differential/Platelet, Basic metabolic panel, Hemoglobin A1c  Anemia, chronic disease - Plan: CBC with Differential/Platelet, Basic metabolic panel, Hemoglobin A1c   Follow-up treatment for cellulitis on baseline edema changes. Apparently getting better but may need to optimize her diuretic status will try 40 mg a.m. 20 p.m. for 5 days to see if it makes a difference in her edema. She states that it does make her diurese. Continue her compression stockings. Lab work today follow-up in 5-6 months or as needed Expectant management. -Patient advised to return or notify health care team  if  new concerns arise.  Patient Instructions  Blood work today to include an anemia check and kidney function. I will send a copy of these labs to your specialists. Check your blood pressure occasionally to make sure it is at goal. Repeat blood pressure in the office today after resting was 138/70.  Your kidney doctor advised we switch the metoprolol to carvedilol her blood pressure wasn't controlled.  For 5 days take double dose of Lasix in the morning which is 40 mg 2 pills at a time. See if that helps decrease swelling in your legs.  If it helps and then reoccurs when you both go back to your regular dose contact us for advice.  Plan [name] in 5- 6 months or as needed       Mariann Laster K. Raad Clayson M.D.

## 2016-10-06 NOTE — Patient Instructions (Addendum)
Blood work today to include an anemia check and kidney function. I will send a copy of these labs to your specialists. Check your blood pressure occasionally to make sure it is at goal. Repeat blood pressure in the office today after resting was 138/70.  Your kidney doctor advised we switch the metoprolol to carvedilol her blood pressure wasn't controlled.  For 5 days take double dose of Lasix in the morning which is 40 mg 2 pills at a time. See if that helps decrease swelling in your legs.  If it helps and then reoccurs when you both go back to your regular dose contact us for advice.  Plan [name] in 5- 6 months or as needed

## 2016-10-08 ENCOUNTER — Other Ambulatory Visit: Payer: Self-pay | Admitting: Internal Medicine

## 2016-10-10 NOTE — Telephone Encounter (Signed)
Sent to the pharmacy by e-scribe for e-scribe.

## 2016-10-12 ENCOUNTER — Other Ambulatory Visit: Payer: Self-pay | Admitting: Internal Medicine

## 2016-10-16 ENCOUNTER — Other Ambulatory Visit: Payer: Self-pay | Admitting: Emergency Medicine

## 2016-10-16 DIAGNOSIS — N289 Disorder of kidney and ureter, unspecified: Secondary | ICD-10-CM

## 2016-10-23 ENCOUNTER — Other Ambulatory Visit: Payer: Self-pay | Admitting: Internal Medicine

## 2016-10-25 ENCOUNTER — Other Ambulatory Visit: Payer: Self-pay | Admitting: Internal Medicine

## 2016-10-28 ENCOUNTER — Other Ambulatory Visit: Payer: Self-pay | Admitting: Internal Medicine

## 2016-11-01 DIAGNOSIS — H2512 Age-related nuclear cataract, left eye: Secondary | ICD-10-CM | POA: Diagnosis not present

## 2016-11-01 DIAGNOSIS — H25812 Combined forms of age-related cataract, left eye: Secondary | ICD-10-CM | POA: Diagnosis not present

## 2016-11-03 ENCOUNTER — Other Ambulatory Visit: Payer: Self-pay | Admitting: Internal Medicine

## 2016-11-07 DIAGNOSIS — H25011 Cortical age-related cataract, right eye: Secondary | ICD-10-CM | POA: Diagnosis not present

## 2016-11-07 DIAGNOSIS — H2511 Age-related nuclear cataract, right eye: Secondary | ICD-10-CM | POA: Diagnosis not present

## 2016-11-08 ENCOUNTER — Telehealth: Payer: Self-pay | Admitting: Internal Medicine

## 2016-11-08 NOTE — Telephone Encounter (Signed)
° ° °  Pt would like a call back concerning a med that she is taking. She her left leg and ankle is swollen .   [phone]

## 2016-11-09 NOTE — Progress Notes (Signed)
Chief Complaint  Patient presents with  . Foot Swelling    swelling started monday     HPI: [name] 83 y.o. sda  cocner about  Pain and  Ankle swelling on the left more than right   Had pill for  Back issues  Bid .  Per ortho  ? The name  Last week she was walking better and now for 4 days ankles are swollen and tender around the area  Without fever fall or new injury .  No  Other change in meds  ? Hx of gout or now.  Remote hs of ankle swelling pain  And had x ray 2016 Not recnetly  Supposed to have repeat  Lab kidney funcntion  ROS: See pertinent positives and negatives per HPI. No cp sob today    Past Medical History:  Diagnosis Date  . Allergy   . Anemia   . Anxiety   . Arthritis    "shoulders" (09/09/2012)  . Asthma    " onset when young"   . Diabetes mellitus without complication ([place])    "Borderline" per pt; being monitored  . GERD (gastroesophageal reflux disease)   . Headache(784.0)    "related to my high blood pressure" (09/09/2012)  . History of DVT (deep vein thrombosis)    "RLE" (09/09/2012) coumadine cant take asa so on plavix   . Hyperlipidemia   . Hypertension   . Lupus    "cured years ago" (09/09/2012)  . Varicose veins     Family History  Problem Relation Age of Onset  . Hypertension Mother   . Breast cancer Mother   . Hypertension Father   . Cancer Sister        Colon and Breast Cancer  . Breast cancer Sister   . Hypertension Brother   . Heart attack Daughter   . Lung cancer Unknown        both parents     Social History   Social History  . Marital status: Single    Spouse name: N/A  . Number of children: N/A  . Years of education: N/A   Social History Main Topics  . Smoking status: Never Smoker  . Smokeless tobacco: Never Used  . Alcohol use No  . Drug use: No  . Sexual activity: Not Asked   Other Topics Concern  . None   Social History Narrative   2 people living in the home.  Grand daughter.   Up and down through  the night      Had 7 children  2 deceased . 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    works 3 days per week currently .   Glasses dentures  Neg tad     Outpatient Medications Prior to Visit  Medication Sig Dispense Refill  . acetaminophen (TYLENOL) 500 MG tablet Take 1,000 mg by mouth every 6 (six) hours as needed for moderate pain. Reported on 04/30/2015    . ADVAIR DISKUS 250-50 MCG/DOSE AEPB USE 1 PUFF TWICE DAILY. 60 each 5  . albuterol (PROAIR HFA) 108 (90 Base) MCG/ACT inhaler USE 2 PUFFS EVERY 6 HOURS AS NEEDED FOR WHEEZING. 8.5 g 4  . albuterol (PROVENTIL) (2.5 MG/3ML) 0.083% nebulizer solution USE 1 AMPULE EVERY 6 HOURS AS NEEDED FOR WHEEZE 75 mL 1  . citalopram (CELEXA) 10 MG tablet TAKE 1&1/2 TABLETS DAILY FOR ANXIETY/PANIC. 45 tablet  0  . clopidogrel (PLAVIX) 75 MG tablet TAKE 1 TABLET ONCE DAILY. 30 tablet 5  . diclofenac sodium (VOLTAREN) 1 % GEL APPLY 4GM TO AFFECTED AREA(S) 4 TIMES A DAY. 100 g 1  . doxazosin (CARDURA) 2 MG tablet TAKE 3 TABLETS AT BEDTIME. 90 tablet 2  . fenofibrate 160 MG tablet TAKE 1 TABLET ONCE DAILY. 90 tablet 0  . ferrous sulfate 325 (65 FE) MG EC tablet Take 1 tablet (325 mg total) by mouth daily with breakfast. 30 tablet 3  . furosemide (LASIX) 20 MG tablet Take 1 tablet (20 mg total) by mouth 2 (two) times daily. 65 tablet 5  . metoprolol succinate (TOPROL-XL) 50 MG 24 hr tablet TAKE 1 TABLET ONCE DAILY WITH FOOD. 30 tablet 3  . montelukast (SINGULAIR) 10 MG tablet TAKE 1 TABLET ONCE DAILY. 90 tablet 0  . Olmesartan-Amlodipine-HCTZ 40-10-25 MG TABS TAKE 1 TABLET ONCE DAILY. 30 tablet 3  . pantoprazole (PROTONIX) 40 MG tablet TAKE 1 TABLET ONCE DAILY. 30 tablet 3  . potassium chloride (K-DUR) 10 MEQ tablet TAKE 4 TABLETS DAILY. 120 tablet 0  . RESTASIS 0.05 % ophthalmic emulsion     . Spacer/Aero-Holding Chambers (AEROCHAMBER PLUS) inhaler Use as instructed 1 each 2  .  vitamin B-12 (CYANOCOBALAMIN) 1000 MCG tablet Take 1,000 mcg by mouth daily.    [name] doxycycline (VIBRAMYCIN) 100 MG capsule Take 1 capsule (100 mg total) by mouth 2 (two) times daily. 20 capsule 0   No facility-administered medications prior to visit.      EXAM:  BP (!) 160/70 (BP Location: Left Arm, Cuff Size: Normal)   Pulse 63   Temp 98.6 F (37 C) (Oral)   Wt 184 lb 3.2 oz (83.6 kg)   BMI 34.80 kg/m   Body mass index is 34.8 kg/m.  GENERAL: vitals reviewed and listed above, alert, oriented, appears well hydrated and in no acute distress HEENT: atraumatic, conjunctiva  clear, no obvious abnormalities on inspection of external nose and ears  NECK: no obvious masses on inspection palpation  LUNGS: clear to auscultation bilaterally, no wheezes, rales or rhonchi, CV: HRRR, no clubbing cyanosis      Legs 2+   with compression stockings  Nl cap   refill  MS: moves all extremities  Bilateral ankle swelling but right more than left and tenderness around medial and lateral ankle   No redness or warmth  No  Bruising  PSYCH: pleasant and cooperative, no obvious depression or anxiety   Wt Readings from Last 3 Encounters:  11/10/16 184 lb 3.2 oz (83.6 kg)  10/06/16 183 lb 3.2 oz (83.1 kg)  06/19/16 183 lb 9.6 oz (83.3 kg)     ASSESSMENT AND PLAN:  Discussed the following assessment and plan:  Arthralgia of ankle, unspecified laterality - Plan: Uric Acid, Basic metabolic panel  Edema, unspecified type  Essential hypertension - Plan: Uric Acid, Basic metabolic panel  Renal insufficiency creatinine 1.3 - Plan: Uric Acid, Basic metabolic panel  CKD (chronic kidney disease) stage 3, GFR 30-59 ml/min Suspect arthritic plus edema     Intensify lasix double dose for a week and add 5 day s pred   colchicine and NSAID at this time may be to deleterious  For this situation  Close fu advised   consider seeing her ortho for the ankle pain per se  -Patient advised to return or notify health  care team  if symptoms worsen ,persist or new concerns arise.  Patient Instructions  This may  be ankle arthritis or strain in addition to fluid retention. Your x-ray in 2015 just showed some swelling in the area. We are checking your chemistry and kidney function today at follow-up. We can try a different anti-inflammatory prednisone  for your ankle and also Increase the lasix to 40 mg twice a day for  A week .  To see if helps the swelling .    If not imporved n a week may get the orthopedist  You see to see  You for the ankle pain .   [name] in  3-4 weeks or as needed .  continue compression stockings          Standley Brooking. Kyira Volkert M.D.

## 2016-11-09 NOTE — Telephone Encounter (Signed)
Pt has an appointment with dr Regis Bill tomorrow at 11:00am ...advise patient to keep leg elevated until then

## 2016-11-10 ENCOUNTER — Ambulatory Visit (INDEPENDENT_AMBULATORY_CARE_PROVIDER_SITE_OTHER): Payer: Medicare Other | Admitting: Internal Medicine

## 2016-11-10 ENCOUNTER — Encounter: Payer: Self-pay | Admitting: Internal Medicine

## 2016-11-10 VITALS — BP 160/70 | HR 63 | Temp 98.6°F | Wt 184.2 lb

## 2016-11-10 DIAGNOSIS — N289 Disorder of kidney and ureter, unspecified: Secondary | ICD-10-CM | POA: Diagnosis not present

## 2016-11-10 DIAGNOSIS — N183 Chronic kidney disease, stage 3 unspecified: Secondary | ICD-10-CM

## 2016-11-10 DIAGNOSIS — R609 Edema, unspecified: Secondary | ICD-10-CM | POA: Diagnosis not present

## 2016-11-10 DIAGNOSIS — M25579 Pain in unspecified ankle and joints of unspecified foot: Secondary | ICD-10-CM | POA: Diagnosis not present

## 2016-11-10 DIAGNOSIS — I10 Essential (primary) hypertension: Secondary | ICD-10-CM | POA: Diagnosis not present

## 2016-11-10 LAB — BASIC METABOLIC PANEL
BUN: 27 mg/dL — ABNORMAL HIGH (ref 6–23)
CHLORIDE: 100 meq/L (ref 96–112)
CO2: 32 mEq/L (ref 19–32)
Calcium: 9.7 mg/dL (ref 8.4–10.5)
Creatinine, Ser: 1.63 mg/dL — ABNORMAL HIGH (ref 0.40–1.20)
GFR: 38.96 mL/min — AB (ref >60.00)
Glucose, Bld: 110 mg/dL — ABNORMAL HIGH (ref 70–99)
POTASSIUM: 3.9 meq/L (ref 3.5–5.1)
SODIUM: 139 meq/L (ref 135–145)

## 2016-11-10 LAB — URIC ACID: URIC ACID, SERUM: 9.4 mg/dL — AB (ref 2.4–7.0)

## 2016-11-10 MED ORDER — PREDNISONE 20 MG PO TABS: 20.0000 mg | ORAL_TABLET | Freq: Two times a day (BID) | ORAL | 0 refills | Status: DC

## 2016-11-10 NOTE — Patient Instructions (Addendum)
This may be ankle arthritis or strain in addition to fluid retention. Your x-ray in 2015 just showed some swelling in the area. We are checking your chemistry and kidney function today at follow-up. We can try a different anti-inflammatory prednisone  for your ankle and also Increase the lasix to 40 mg twice a day for  A week .  To see if helps the swelling .    If not imporved n a week may get the orthopedist  You see to see  You for the ankle pain .   [name] in  3-4 weeks or as needed .  continue compression stockings

## 2016-11-17 ENCOUNTER — Other Ambulatory Visit: Payer: Medicare Other

## 2016-11-22 DIAGNOSIS — H25811 Combined forms of age-related cataract, right eye: Secondary | ICD-10-CM | POA: Diagnosis not present

## 2016-11-22 DIAGNOSIS — H2511 Age-related nuclear cataract, right eye: Secondary | ICD-10-CM | POA: Diagnosis not present

## 2016-11-24 ENCOUNTER — Other Ambulatory Visit: Payer: Self-pay | Admitting: Internal Medicine

## 2016-11-24 NOTE — Telephone Encounter (Signed)
Ok to refill x 6 months 

## 2016-12-02 ENCOUNTER — Other Ambulatory Visit: Payer: Self-pay | Admitting: Internal Medicine

## 2016-12-13 ENCOUNTER — Other Ambulatory Visit: Payer: Self-pay | Admitting: Internal Medicine

## 2016-12-25 ENCOUNTER — Ambulatory Visit (INDEPENDENT_AMBULATORY_CARE_PROVIDER_SITE_OTHER): Payer: Medicare Other | Admitting: Family Medicine

## 2016-12-25 ENCOUNTER — Encounter: Payer: Self-pay | Admitting: Family Medicine

## 2016-12-25 VITALS — BP 148/78 | HR 60 | Temp 98.9°F | Wt 181.0 lb

## 2016-12-25 DIAGNOSIS — J45901 Unspecified asthma with (acute) exacerbation: Secondary | ICD-10-CM

## 2016-12-25 MED ORDER — METHYLPREDNISOLONE ACETATE 40 MG/ML IJ SUSP
40.0000 mg | Freq: Once | INTRAMUSCULAR | Status: AC
  Administered 2016-12-25: 40 mg via INTRAMUSCULAR

## 2016-12-25 MED ORDER — PREDNISONE 20 MG PO TABS: ORAL_TABLET | ORAL | 0 refills | Status: DC

## 2016-12-25 MED ORDER — DOXYCYCLINE HYCLATE 100 MG PO TABS: 100.0000 mg | ORAL_TABLET | Freq: Two times a day (BID) | ORAL | 0 refills | Status: DC

## 2016-12-25 NOTE — Progress Notes (Signed)
Patient ID: [name], female   DOB: 05-29-1933, 83 y.o.   MRN: [id number]  PCP: Burnis Medin, MD  Subjective:  [name] is a 83 y.o. year old very pleasant female patient who presents with symptoms including nasal congestion and cough that is productive of yellow sputum. -started: greater than one week ago, symptoms are not improving -previous treatments: Mucinex has provided limited benefit. Albuterol nebulizer today has provided benefit -sick contacts/travel/risks: denies flu exposure. No recent sick contact exposure. -Hx of: allergies Similar prior episodes have been reported and patient was treated with Depo-Medrol and oral prednisone with great benefit.  ROS-denies fever, SOB, NVD, tooth pain, hemoptysis  Pertinent Past Medical History- Asthma  Medications- reviewed  Current Outpatient Prescriptions  Medication Sig Dispense Refill  . acetaminophen (TYLENOL) 500 MG tablet Take 1,000 mg by mouth every 6 (six) hours as needed for moderate pain. Reported on 04/30/2015    . ADVAIR DISKUS 250-50 MCG/DOSE AEPB USE 1 PUFF TWICE DAILY. 60 each 5  . albuterol (PROAIR HFA) 108 (90 Base) MCG/ACT inhaler USE 2 PUFFS EVERY 6 HOURS AS NEEDED FOR WHEEZING. 8.5 g 4  . albuterol (PROVENTIL) (2.5 MG/3ML) 0.083% nebulizer solution USE 1 AMPULE EVERY 6 HOURS AS NEEDED FOR WHEEZE 75 mL 1  . citalopram (CELEXA) 10 MG tablet TAKE 1&1/2 TABLETS DAILY FOR ANXIETY/PANIC. 45 tablet 0  . clopidogrel (PLAVIX) 75 MG tablet TAKE 1 TABLET ONCE DAILY. 30 tablet 5  . diclofenac sodium (VOLTAREN) 1 % GEL APPLY 4GM TO AFFECTED AREA(S) 4 TIMES A DAY. 100 g 1  . doxazosin (CARDURA) 2 MG tablet TAKE 3 TABLETS AT BEDTIME. 90 tablet 0  . fenofibrate 160 MG tablet TAKE 1 TABLET ONCE DAILY. 90 tablet 1  . ferrous sulfate 325 (65 FE) MG EC tablet Take 1 tablet (325 mg total) by mouth daily with breakfast. 30 tablet 3  . furosemide (LASIX) 20 MG tablet Take 1 tablet (20 mg total) by mouth 2 (two) times daily. 65  tablet 5  . metoprolol succinate (TOPROL-XL) 50 MG 24 hr tablet TAKE 1 TABLET ONCE DAILY WITH FOOD. 30 tablet 3  . montelukast (SINGULAIR) 10 MG tablet TAKE 1 TABLET ONCE DAILY. 90 tablet 0  . Olmesartan-Amlodipine-HCTZ 40-10-25 MG TABS TAKE 1 TABLET ONCE DAILY. 30 tablet 3  . pantoprazole (PROTONIX) 40 MG tablet TAKE 1 TABLET ONCE DAILY. 30 tablet 3  . potassium chloride (K-DUR) 10 MEQ tablet TAKE 4 TABLETS DAILY. 120 tablet 0  . predniSONE (DELTASONE) 20 MG tablet Take 1 tablet (20 mg total) by mouth 2 (two) times daily with a meal. 10 tablet 0  . RESTASIS 0.05 % ophthalmic emulsion     . Spacer/Aero-Holding Chambers (AEROCHAMBER PLUS) inhaler Use as instructed 1 each 2  . vitamin B-12 (CYANOCOBALAMIN) 1000 MCG tablet Take 1,000 mcg by mouth daily.     No current facility-administered medications for this visit.    Past Medical History:  Diagnosis Date  . Allergy   . Anemia   . Anxiety   . Arthritis    "shoulders" (09/09/2012)  . Asthma    " onset when young"   . Diabetes mellitus without complication ([facility])    "Borderline" per pt; being monitored  . GERD (gastroesophageal reflux disease)   . Headache(784.0)    "related to my high blood pressure" (09/09/2012)  . History of DVT (deep vein thrombosis)    "RLE" (09/09/2012) coumadine cant take asa so on plavix   . Hyperlipidemia   .  Hypertension   . Lupus    "cured years ago" (09/09/2012)  . Varicose veins      Social History   Social History  . Marital status: Single    Spouse name: N/A  . Number of children: N/A  . Years of education: N/A   Occupational History  . Not on file.   Social History Main Topics  . Smoking status: Never Smoker  . Smokeless tobacco: Never Used  . Alcohol use No  . Drug use: No  . Sexual activity: Not on file   Other Topics Concern  . Not on file   Social History Narrative   2 people living in the home.  Grand daughter.   Up and down through the night      Had 7 children  2 deceased .  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    works 3 days per week currently .   Glasses dentures  Neg tad     Past Surgical History:  Procedure Laterality Date  . ABDOMINAL HYSTERECTOMY     partial  . BREAST EXCISIONAL BIOPSY Right   . BREAST SURGERY    . CARPAL TUNNEL RELEASE Right 2000's  . CARPAL TUNNEL RELEASE Bilateral 10/21/2013   Procedure: BILATERAL  CARPAL TUNNEL RELEASE;  Surgeon: [name], MD;  Location: [facility];  Service: Orthopedics;  Laterality: Bilateral;  . SHOULDER OPEN ROTATOR CUFF REPAIR Bilateral 2000's  . TRIGGER FINGER RELEASE Right 10/21/2013   Procedure: RELEASE TRIGGER FINGER/A-1 PULLEY RIGHT MIDDLE AND RIGHT RING;  Surgeon: [name], MD;  Location: [facility];  Service: Orthopedics;  Laterality: Right;    Family History  Problem Relation Age of Onset  . Hypertension Mother   . Breast cancer Mother   . Hypertension Father   . Cancer Sister        Colon and Breast Cancer  . Breast cancer Sister   . Hypertension Brother   . Heart attack Daughter   . Lung cancer Unknown        both parents     Allergies  Allergen Reactions  . Penicillins Nausea And Vomiting and Other (See Comments)    Wheezing  . Aspirin Other (See Comments)    Wheezing Acetaminophen is OK    Current Outpatient Prescriptions on File Prior to Visit  Medication Sig Dispense Refill  . acetaminophen (TYLENOL) 500 MG tablet Take 1,000 mg by mouth every 6 (six) hours as needed for moderate pain. Reported on 04/30/2015    . ADVAIR DISKUS 250-50 MCG/DOSE AEPB USE 1 PUFF TWICE DAILY. 60 each 5  . albuterol (PROAIR HFA) 108 (90 Base) MCG/ACT inhaler USE 2 PUFFS EVERY 6 HOURS AS NEEDED FOR WHEEZING. 8.5 g 4  . albuterol (PROVENTIL) (2.5 MG/3ML) 0.083% nebulizer solution USE 1 AMPULE EVERY 6 HOURS AS NEEDED FOR WHEEZE 75 mL 1  . citalopram (CELEXA) 10 MG tablet TAKE 1&1/2 TABLETS  DAILY FOR ANXIETY/PANIC. 45 tablet 0  . clopidogrel (PLAVIX) 75 MG tablet TAKE 1 TABLET ONCE DAILY. 30 tablet 5  . diclofenac sodium (VOLTAREN) 1 % GEL APPLY 4GM TO AFFECTED AREA(S) 4 TIMES A DAY. 100 g 1  . doxazosin (CARDURA) 2 MG tablet TAKE 3 TABLETS AT BEDTIME. 90 tablet 0  . fenofibrate 160 MG tablet TAKE 1 TABLET ONCE DAILY. 90 tablet 1  . ferrous sulfate 325 (65 FE) MG  EC tablet Take 1 tablet (325 mg total) by mouth daily with breakfast. 30 tablet 3  . furosemide (LASIX) 20 MG tablet Take 1 tablet (20 mg total) by mouth 2 (two) times daily. 65 tablet 5  . metoprolol succinate (TOPROL-XL) 50 MG 24 hr tablet TAKE 1 TABLET ONCE DAILY WITH FOOD. 30 tablet 3  . montelukast (SINGULAIR) 10 MG tablet TAKE 1 TABLET ONCE DAILY. 90 tablet 0  . Olmesartan-Amlodipine-HCTZ 40-10-25 MG TABS TAKE 1 TABLET ONCE DAILY. 30 tablet 3  . pantoprazole (PROTONIX) 40 MG tablet TAKE 1 TABLET ONCE DAILY. 30 tablet 3  . potassium chloride (K-DUR) 10 MEQ tablet TAKE 4 TABLETS DAILY. 120 tablet 0  . RESTASIS 0.05 % ophthalmic emulsion     . Spacer/Aero-Holding Chambers (AEROCHAMBER PLUS) inhaler Use as instructed 1 each 2  . vitamin B-12 (CYANOCOBALAMIN) 1000 MCG tablet Take 1,000 mcg by mouth daily.    . predniSONE (DELTASONE) 20 MG tablet Take 1 tablet (20 mg total) by mouth 2 (two) times daily with a meal. (Patient not taking: Reported on 12/25/2016) 10 tablet 0   No current facility-administered medications on file prior to visit.     BP (!) 148/78 (BP Location: Left Arm, Patient Position: Sitting, Cuff Size: Normal)   Pulse 60   Temp 98.9 F (37.2 C) (Oral)   Wt 181 lb (82.1 kg)   SpO2 98%   BMI 34.20 kg/m     Objective: Gen: NAD, resting comfortably HEENT: Turbinates erythematous, TM normal, pharynx with no tonsilar exudate, erythema, or edema, no sinus tenderness. Rhinitis present with clear drainage CV: RRR no murmurs rubs or gallops Lungs: CTAB no crackles or rhonchi. Bilaterally mild wheezing  present Abdomen: soft/nontender/nondistended/normal bowel sounds. No rebound or guarding.  Ext: no edema Skin: warm, dry, no rash Neuro: grossly normal, moves all extremities  Assessment/Plan: 1. Asthma, chronic, unspecified asthma severity, with acute exacerbation Wheezing with prior history of similar symptoms that were improved with steroids noted; PCN allergy present and renal insufficiency limits antibiotic options. Pulse oximeter 98% with RR of 15 noted. With prior history, we discussed options for treatment and agreed to proceed with Depo-Medrol 40 mg in office and burst of oral prednisone for 5 days for symptoms.  With symptoms lasting greater than one week and not improving, will provide doxycycline today as symptoms may have triggered by infection. She agreed to close follow up if symptoms do not improve with treatment. - predniSONE (DELTASONE) 20 MG tablet; Take two tablets once daily with food  Dispense: 10 tablet; Refill: 0 - doxycycline (VIBRA-TABS) 100 MG tablet; Take 1 tablet (100 mg total) by mouth 2 (two) times daily.  Dispense: 20 tablet; Refill: 0 - methylPREDNISolone acetate (DEPO-MEDROL) injection 40 mg; Inject 1 mL (40 mg total) into the muscle once.  Finally, we reviewed reasons to return to care including if symptoms worsen or persist or new concerns arise- once again particularly shortness of breath or fever.    Laurita Quint, FNP

## 2016-12-25 NOTE — Patient Instructions (Addendum)
Please take medication as directed with food and follow up in 2 to 3 days with your provider or lung doctor  if symptoms do not improve with treatment, worsen, or you develop shortness of breath or fever. Your symptoms of upper respiratory such as nasal congestion may have triggered symptoms. We will treat with an injection and I have provided prednisone for you to take at home with an antibiotic.    Asthma, Adult Asthma is a condition of the lungs in which the airways tighten and narrow. Asthma can make it hard to breathe. Asthma cannot be cured, but medicine and lifestyle changes can help control it. Asthma may be started (triggered) by:  Animal skin flakes (dander).  Dust.  Cockroaches.  Pollen.  Mold.  Smoke.  Cleaning products.  Hair sprays or aerosol sprays.  Paint fumes or strong smells.  Cold air, weather changes, and winds.  Crying or laughing hard.  Stress.  Certain medicines or drugs.  Foods, such as dried fruit, potato chips, and sparkling grape juice.  Infections or conditions (colds, flu).  Exercise.  Certain medical conditions or diseases.  Exercise or tiring activities.  Follow these instructions at home:  Take medicine as told by your doctor.  Use a peak flow meter as told by your doctor. A peak flow meter is a tool that measures how well the lungs are working.  Record and keep track of the peak flow meter's readings.  Understand and use the asthma action plan. An asthma action plan is a written plan for taking care of your asthma and treating your attacks.  To help prevent asthma attacks: ? Do not smoke. Stay away from secondhand smoke. ? Change your heating and air conditioning filter often. ? Limit your use of fireplaces and wood stoves. ? Get rid of pests (such as roaches and mice) and their droppings. ? Throw away plants if you see mold on them. ? Clean your floors. Dust regularly. Use cleaning products that do not smell. ? Have  someone vacuum when you are not home. Use a vacuum cleaner with a HEPA filter if possible. ? Replace carpet with wood, tile, or vinyl flooring. Carpet can trap animal skin flakes and dust. ? Use allergy-proof pillows, mattress covers, and box spring covers. ? Wash bed sheets and blankets every week in hot water and dry them in a dryer. ? Use blankets that are made of polyester or cotton. ? Clean bathrooms and kitchens with bleach. If possible, have someone repaint the walls in these rooms with mold-resistant paint. Keep out of the rooms that are being cleaned and painted. ? Wash hands often. Contact a doctor if:  You have make a whistling sound when breaking (wheeze), have shortness of breath, or have a cough even if taking medicine to prevent attacks.  The colored mucus you cough up (sputum) is thicker than usual.  The colored mucus you cough up changes from clear or white to yellow, green, gray, or bloody.  You have problems from the medicine you are taking such as: ? A rash. ? Itching. ? Swelling. ? Trouble breathing.  You need reliever medicines more than 2-3 times a week.  Your peak flow measurement is still at 50-79% of your personal best after following the action plan for 1 hour.  You have a fever. Get help right away if:  You seem to be worse and are not responding to medicine during an asthma attack.  You are short of breath even at rest.  You get short of breath when doing very little activity.  You have trouble eating, drinking, or talking.  You have chest pain.  You have a fast heartbeat.  Your lips or fingernails start to turn blue.  You are light-headed, dizzy, or faint.  Your peak flow is less than 50% of your personal best. This information is not intended to replace advice given to you by your health care provider. Make sure you discuss any questions you have with your health care provider. Document Released: 10/18/2007 Document Revised: 10/07/2015  Document Reviewed: 11/28/2012 Elsevier Interactive Patient Education  2017 [employer].

## 2016-12-27 ENCOUNTER — Other Ambulatory Visit: Payer: Self-pay | Admitting: Internal Medicine

## 2017-01-01 ENCOUNTER — Other Ambulatory Visit: Payer: Self-pay | Admitting: Internal Medicine

## 2017-01-04 ENCOUNTER — Encounter (HOSPITAL_COMMUNITY): Payer: Self-pay | Admitting: Nurse Practitioner

## 2017-01-04 ENCOUNTER — Ambulatory Visit (INDEPENDENT_AMBULATORY_CARE_PROVIDER_SITE_OTHER): Payer: Self-pay | Admitting: Orthopedic Surgery

## 2017-01-04 ENCOUNTER — Ambulatory Visit (HOSPITAL_COMMUNITY)
Admission: EM | Admit: 2017-01-04 | Discharge: 2017-01-04 | Disposition: A | Discharge status: Home / Self Care | Payer: Medicare Other | Attending: Emergency Medicine | Admitting: Emergency Medicine

## 2017-01-04 DIAGNOSIS — M5432 Sciatica, left side: Secondary | ICD-10-CM | POA: Diagnosis not present

## 2017-01-04 DIAGNOSIS — J45901 Unspecified asthma with (acute) exacerbation: Secondary | ICD-10-CM

## 2017-01-04 MED ORDER — LIDOCAINE 5 % EX PTCH: 1.0000 | MEDICATED_PATCH | CUTANEOUS | 0 refills | Status: DC

## 2017-01-04 MED ORDER — TRAMADOL HCL 50 MG PO TABS: ORAL_TABLET | ORAL | 0 refills | Status: DC

## 2017-01-04 MED ORDER — PREDNISONE 10 MG (21) PO TBPK: ORAL_TABLET | ORAL | 0 refills | Status: DC

## 2017-01-04 NOTE — ED Provider Notes (Signed)
[facility]   [id number] 01/04/17 Arrival Time: 9211  ASSESSMENT & PLAN:  1. Sciatica of left side   2. Asthma, chronic, unspecified asthma severity, with acute exacerbation     Meds ordered this encounter  Medications  . predniSONE (STERAPRED UNI-PAK 21 TAB) 10 MG (21) TBPK tablet    Sig: Take 6-5-4-3-2-1 po qd    Dispense:  21 tablet    Refill:  0    Order Specific Question:   Supervising Provider    Answer:   Melynda Ripple [[id number]]  . traMADol (ULTRAM) 50 MG tablet    Sig: Take one to two po q 6 hours prn pain    Dispense:  24 tablet    Refill:  0    Order Specific Question:   Supervising Provider    Answer:   Melynda Ripple [[id number]]    Reviewed expectations re: course of current medical issues. Questions answered. Outlined signs and symptoms indicating need for more acute intervention. Patient verbalized understanding. After Visit Summary given.   SUBJECTIVE:  ANAYA BOVEE is a 83 y.o. female who presents with complaint of Left back pain radiating down her left buttock to her left knee.  She is having moderate pain.  ROS: As per HPI.   OBJECTIVE:  Vitals:   01/04/17 1744  BP: (!) 156/68  Pulse: 67  Resp: 16  Temp: 98.6 F (37 C)  TempSrc: Oral  SpO2: 98%     General appearance: alert; no distress Eyes: PERRLA; EOMI; conjunctiva normal Lungs: clear to auscultation bilaterally Heart: regular rate and rhythm Abdomen: soft, non-tender; bowel sounds normal; no masses or organomegaly; no guarding or rebound tenderness Back: no CVA tenderness TTP left lumbar paraspinous muscles and left sciatic notch. Neurologic: normal gait; normal symmetric reflexes Psychological: alert and cooperative; normal mood and affect  Past Medical History:  Diagnosis Date  . Allergy   . Anemia   . Anxiety   . Arthritis    "shoulders" (09/09/2012)  . Asthma    " onset when young"   . Diabetes mellitus without complication ([place])    "Borderline" per pt;  being monitored  . GERD (gastroesophageal reflux disease)   . Headache(784.0)    "related to my high blood pressure" (09/09/2012)  . History of DVT (deep vein thrombosis)    "RLE" (09/09/2012) coumadine cant take asa so on plavix   . Hyperlipidemia   . Hypertension   . Lupus    "cured years ago" (09/09/2012)  . Varicose veins      has a past medical history of Allergy; Anemia; Anxiety; Arthritis; Asthma; Diabetes mellitus without complication ([place]); GERD (gastroesophageal reflux disease); Headache(784.0); History of DVT (deep vein thrombosis); Hyperlipidemia; Hypertension; Lupus; and Varicose veins.    Labs Reviewed - No data to display  Imaging: No results found.  Allergies  Allergen Reactions  . Penicillins Nausea And Vomiting and Other (See Comments)    Wheezing  . Aspirin Other (See Comments)    Wheezing Acetaminophen is OK    Family History  Problem Relation Age of Onset  . Hypertension Mother   . Breast cancer Mother   . Hypertension Father   . Cancer Sister        Colon and Breast Cancer  . Breast cancer Sister   . Hypertension Brother   . Heart attack Daughter   . Lung cancer Unknown        both parents    Past Surgical History:  Procedure Laterality Date  .  ABDOMINAL HYSTERECTOMY     partial  . BREAST EXCISIONAL BIOPSY Right   . BREAST SURGERY    . CARPAL TUNNEL RELEASE Right 2000's  . CARPAL TUNNEL RELEASE Bilateral 10/21/2013   Procedure: BILATERAL  CARPAL TUNNEL RELEASE;  Surgeon: [name], MD;  Location: [facility];  Service: Orthopedics;  Laterality: Bilateral;  . SHOULDER OPEN ROTATOR CUFF REPAIR Bilateral 2000's  . TRIGGER FINGER RELEASE Right 10/21/2013   Procedure: RELEASE TRIGGER FINGER/A-1 PULLEY RIGHT MIDDLE AND RIGHT RING;  Surgeon: [name], MD;  Location: [facility];  Service: Orthopedics;  Laterality: Right;         Lysbeth Penner, [place] 01/04/17 1810

## 2017-01-04 NOTE — ED Triage Notes (Signed)
Pt presents with c/o lower back pain. The pain began this week and has been a sharp pain in her left hip radiating down to her left leg. shes had a past history of back pain that has been well controlled since she had pain injections last year. She denies recent heavy lifting or exertion

## 2017-01-09 NOTE — Telephone Encounter (Signed)
Pt need new Rx for potassium chloride and albuterol (PROAIR HFA)    Pharm:  [facility]    Pt state that she is out.

## 2017-01-11 ENCOUNTER — Other Ambulatory Visit: Payer: Self-pay | Admitting: Internal Medicine

## 2017-01-19 ENCOUNTER — Ambulatory Visit (INDEPENDENT_AMBULATORY_CARE_PROVIDER_SITE_OTHER): Payer: Medicare Other | Admitting: Orthopedic Surgery

## 2017-01-19 ENCOUNTER — Encounter (INDEPENDENT_AMBULATORY_CARE_PROVIDER_SITE_OTHER): Payer: Self-pay | Admitting: Orthopedic Surgery

## 2017-01-19 DIAGNOSIS — M5416 Radiculopathy, lumbar region: Secondary | ICD-10-CM

## 2017-01-19 MED ORDER — ACETAMINOPHEN-CODEINE #3 300-30 MG PO TABS: 1.0000 | ORAL_TABLET | Freq: Two times a day (BID) | ORAL | 0 refills | Status: DC | PRN

## 2017-01-19 NOTE — Progress Notes (Signed)
Office Visit Note   Patient: [name]           Date of Birth: 05-23-1933           MRN: [id number] Visit Date: 01/19/2017 Requested by: Burnis Medin, MD [place], [place] [address] PCP: Burnis Medin, MD  Subjective: Chief Complaint  Patient presents with  . Left Leg - Pain    HPI: [name] is a patient with left leg low back and ankle pain.  Reports pain ongoing for a while.  Since been worse over the last few weeks.  Patient describes some swelling.  Pain radiates up the leg.  It is painful for her to weight-bear.  She denies any frank groin pain or knee pain.  She takes [place] for the symptoms.  She uses no assistive devices for walking.  It is hard for her to sleep.              ROS: All systems reviewed are negative as they relate to the chief complaint within the history of present illness.  Patient denies  fevers or chills.   Assessment & Plan: Visit Diagnoses:  1. Radiculopathy, lumbar region     Plan: Impression is radiculopathy left lower extremity with no weakness.  Plan is to try Tylenol No. 3 short-term for pain relief until she can get an injection with Dr. Hervey Ard.  This is helped her in the past.  She wants to avoid surgery if at all possible.  Follow-Up Instructions: No Follow-up on file.   Orders:  Orders Placed This Encounter  Procedures  . Ambulatory referral to Physical Medicine Rehab   Meds ordered this encounter  Medications  . acetaminophen-codeine (TYLENOL #3) 300-30 MG tablet    Sig: Take 1 tablet by mouth every 12 (twelve) hours as needed for moderate pain.    Dispense:  30 tablet    Refill:  0      Procedures: No procedures performed   Clinical Data: No additional findings.  Objective: Vital Signs: There were no vitals taken for this visit.  Physical Exam:   Constitutional: Patient appears well-developed HEENT:  Head: Normocephalic Eyes:EOM are normal Neck: Normal range of motion Cardiovascular:  Normal rate Pulmonary/chest: Effort normal Neurologic: Patient is alert Skin: Skin is warm Psychiatric: Patient has normal mood and affect    Ortho Exam: Orthopedic exam demonstrates palpable pedal pulses good ankle dorsiflexion and plantarflexion strength no paresthesias L1-S1 bilaterally.  This is on the right-hand side.  On the left-hand side the patient does have some L5 paresthesias.  No groin pain with internal/external rotation of the leg and no knee effusion.  Specialty Comments:  No specialty comments available.  Imaging: No results found.   PMFS History: Patient Active Problem List   Diagnosis Date Noted  . Renal cyst 03/27/2016  . CKD (chronic kidney disease) stage 3, GFR 30-59 ml/min 08/23/2015  . Acute respiratory infection 07/13/2014  . Perceived hearing changes 06/29/2014  . Vertigo 06/29/2014  . Resistant hypertension 06/29/2014  . Lumbago 06/15/2014  . Pre-diabetes vs early DM  06/15/2014  . Dyspnea 04/27/2014  . Asthma, chronic 04/13/2014  . Elevated uric acid in blood 04/13/2014  . Acute superficial venous thrombosis of left lower extremity 03/27/2014  . Essential hypertension 03/27/2014  . History of DVT (deep vein thrombosis)   . Palpitations 01/05/2014  . Ankle pain 01/05/2014  . Anemia of chronic disease 01/05/2014  . Death of family member 09-09-2013  . Leg  edema 06/20/2013  . Bereavement due to life event 04/21/2013  . Other dysphagia 02/11/2013  . Colon cancer screening 02/11/2013  . Headache(784.0) 01/20/2013  . Anxiety state 01/20/2013  . Leg cramps 01/20/2013  . Back pain 12/05/2012  . Family hx of colon cancer 10/21/2012  . Family hx of lung cancer 10/21/2012  . Family hx-breast malignancy 10/21/2012  . Renal insufficiency creatinine 1.3 10/21/2012  . Anemia, chronic disease 10/21/2012  . Chest pain, atypical 09/08/2012  . GERD (gastroesophageal reflux disease) 09/08/2012  . Depression 09/08/2012  . HTN (hypertension) 09/08/2012  .  Hyperlipidemia 09/08/2012  . Asthma 09/08/2012  . Varicose veins of leg with complications 34/74/2595   Past Medical History:  Diagnosis Date  . Allergy   . Anemia   . Anxiety   . Arthritis    "shoulders" (09/09/2012)  . Asthma    " onset when young"   . Diabetes mellitus without complication ([place])    "Borderline" per pt; being monitored  . GERD (gastroesophageal reflux disease)   . Headache(784.0)    "related to my high blood pressure" (09/09/2012)  . History of DVT (deep vein thrombosis)    "RLE" (09/09/2012) coumadine cant take asa so on plavix   . Hyperlipidemia   . Hypertension   . Lupus    "cured years ago" (09/09/2012)  . Varicose veins     Family History  Problem Relation Age of Onset  . Hypertension Mother   . Breast cancer Mother   . Hypertension Father   . Cancer Sister        Colon and Breast Cancer  . Breast cancer Sister   . Hypertension Brother   . Heart attack Daughter   . Lung cancer Unknown        both parents     Past Surgical History:  Procedure Laterality Date  . ABDOMINAL HYSTERECTOMY     partial  . BREAST EXCISIONAL BIOPSY Right   . BREAST SURGERY    . CARPAL TUNNEL RELEASE Right 2000's  . CARPAL TUNNEL RELEASE Bilateral 10/21/2013   Procedure: BILATERAL  CARPAL TUNNEL RELEASE;  Surgeon: [name], MD;  Location: [facility];  Service: Orthopedics;  Laterality: Bilateral;  . SHOULDER OPEN ROTATOR CUFF REPAIR Bilateral 2000's  . TRIGGER FINGER RELEASE Right 10/21/2013   Procedure: RELEASE TRIGGER FINGER/A-1 PULLEY RIGHT MIDDLE AND RIGHT RING;  Surgeon: [name], MD;  Location: [facility];  Service: Orthopedics;  Laterality: Right;   Social History   Occupational History  . Not on file.   Social History Main Topics  . Smoking status: Never Smoker  . Smokeless tobacco: Never Used  . Alcohol use No  . Drug use: No  . Sexual activity: Not on file

## 2017-01-22 ENCOUNTER — Telehealth (INDEPENDENT_AMBULATORY_CARE_PROVIDER_SITE_OTHER): Payer: Self-pay | Admitting: Orthopedic Surgery

## 2017-01-22 NOTE — Telephone Encounter (Signed)
Please advise. Thanks.  

## 2017-01-22 NOTE — Telephone Encounter (Signed)
Patient called asking for a refill on hydrocodone. She was given a RX for tylenol and she wanted to hydrocodone instead. CB # [phone]

## 2017-01-22 NOTE — Telephone Encounter (Signed)
Ok for norco 1 po q d prn # 40

## 2017-01-23 MED ORDER — HYDROCODONE-ACETAMINOPHEN 5-325 MG PO TABS: ORAL_TABLET | ORAL | 0 refills | Status: DC

## 2017-01-23 NOTE — Telephone Encounter (Signed)
IC s/w patient and advised her would be able to pick this up at front desk in the morning.

## 2017-01-23 NOTE — Addendum Note (Signed)
Addended byLaurann Montana on: 01/23/2017 11:54 AM   Modules accepted: Orders

## 2017-01-24 ENCOUNTER — Other Ambulatory Visit: Payer: Self-pay | Admitting: Internal Medicine

## 2017-02-02 ENCOUNTER — Encounter: Payer: Self-pay | Admitting: Internal Medicine

## 2017-02-07 ENCOUNTER — Ambulatory Visit (INDEPENDENT_AMBULATORY_CARE_PROVIDER_SITE_OTHER): Payer: Medicare Other

## 2017-02-07 ENCOUNTER — Ambulatory Visit (INDEPENDENT_AMBULATORY_CARE_PROVIDER_SITE_OTHER): Payer: Medicare Other | Admitting: Physical Medicine and Rehabilitation

## 2017-02-07 ENCOUNTER — Encounter (INDEPENDENT_AMBULATORY_CARE_PROVIDER_SITE_OTHER): Payer: Self-pay | Admitting: Physical Medicine and Rehabilitation

## 2017-02-07 ENCOUNTER — Other Ambulatory Visit: Payer: Self-pay | Admitting: Internal Medicine

## 2017-02-07 VITALS — BP 156/69 | HR 62

## 2017-02-07 DIAGNOSIS — M5416 Radiculopathy, lumbar region: Secondary | ICD-10-CM | POA: Diagnosis not present

## 2017-02-07 MED ORDER — BETAMETHASONE SOD PHOS & ACET 6 (3-3) MG/ML IJ SUSP
12.0000 mg | Freq: Once | INTRAMUSCULAR | Status: AC
  Administered 2017-02-07: 12 mg

## 2017-02-07 MED ORDER — LIDOCAINE HCL (PF) 1 % IJ SOLN
2.0000 mL | Freq: Once | INTRAMUSCULAR | Status: AC
  Administered 2017-02-07: 2 mL

## 2017-02-07 NOTE — Progress Notes (Deleted)
Pain across back. Worse on left side and worse the past few weeks. Radiates down right leg to foot. Difficulty sleeping due to pain. Off plavix 7 days takes in am

## 2017-02-07 NOTE — Patient Instructions (Signed)

## 2017-02-11 NOTE — Procedures (Signed)
[name] is reveals an 83 year old female followed by Dr. Marlou Sa in our office for her orthopedic care. I've seen her on numerous occasions over the years off and on for more low back and radicular type pain at times. She does get pain down the foot at times worse with standing and walking. She has difficulty sleeping. She's been worsening over the last few weeks without any relief with current medications are rest. She has a history of MRI showing lumbar spondylolisthesis of L4 on L5 with severe facet arthropathy as well as lateral recess narrowing at L5 on S1 with degenerative disc height loss. Doesn't have any degree of real focal central stenosis. She's done well with epidural injections and we'll repeat this today. Would consider is always facet joint blocks but she's just done well with intermittent intralaminar epidural steroid injections.The injection  will be diagnostic and hopefully therapeutic. The patient has failed conservative care including time, medications and activity modification.  Lumbar Epidural Steroid Injection - Interlaminar Approach with Fluoroscopic Guidance  Patient: [name]      Date of Birth: 09-02-1933 MRN: [id number] PCP: Burnis Medin, MD      Visit Date: 02/07/2017   Universal Protocol:     Consent Given By: the patient  Position: PRONE  Additional Comments: Vital signs were monitored before and after the procedure. Patient was prepped and draped in the usual sterile fashion. The correct patient, procedure, and site was verified.   Injection Procedure Details:  Procedure Site One Meds Administered:  Meds ordered this encounter  Medications  . lidocaine (PF) (XYLOCAINE) 1 % injection 2 mL  . betamethasone acetate-betamethasone sodium phosphate (CELESTONE) injection 12 mg     Laterality: Left  Location/Site:  L5-S1  Needle size: 20 G  Needle type: Tuohy  Needle Placement: Paramedian epidural  Findings:  -Contrast Used: 1 mL iohexol  180 mg iodine/mL   -Comments: Excellent flow of contrast into the epidural space.  Procedure Details: Using a paramedian approach from the side mentioned above, the region overlying the inferior lamina was localized under fluoroscopic visualization and the soft tissues overlying this structure were infiltrated with 4 ml. of 1% Lidocaine without Epinephrine. The Tuohy needle was inserted into the epidural space using a paramedian approach.   The epidural space was localized using loss of resistance along with lateral and bi-planar fluoroscopic views.  After negative aspirate for air, blood, and CSF, a 2 ml. volume of Isovue-250 was injected into the epidural space and the flow of contrast was observed. Radiographs were obtained for documentation purposes.    The injectate was administered into the level noted above.   Additional Comments:  The patient tolerated the procedure well Dressing: Band-Aid    Post-procedure details: Patient was observed during the procedure. Post-procedure instructions were reviewed.  Patient left the clinic in stable condition.

## 2017-02-12 ENCOUNTER — Other Ambulatory Visit: Payer: Self-pay | Admitting: Internal Medicine

## 2017-02-12 NOTE — Telephone Encounter (Signed)
Medication filled to pharmacy as requested.   

## 2017-02-19 ENCOUNTER — Other Ambulatory Visit: Payer: Self-pay | Admitting: Internal Medicine

## 2017-02-20 NOTE — Telephone Encounter (Signed)
Celexa 10mg  last filled 12/31/16 #45 x 0 refills.  Upcoming appt 03/09/17  Please advise Dr Regis Bill, thanks.

## 2017-02-24 ENCOUNTER — Other Ambulatory Visit: Payer: Self-pay | Admitting: Internal Medicine

## 2017-03-07 ENCOUNTER — Other Ambulatory Visit: Payer: Self-pay | Admitting: Internal Medicine

## 2017-03-07 NOTE — Progress Notes (Deleted)
No chief complaint on file.   HPI: [name] 83 y.o. come in for Chronic disease management  ROS: See pertinent positives and negatives per HPI.  Past Medical History:  Diagnosis Date  . Allergy   . Anemia   . Anxiety   . Arthritis    "shoulders" (09/09/2012)  . Asthma    " onset when young"   . Diabetes mellitus without complication ([place])    "Borderline" per pt; being monitored  . GERD (gastroesophageal reflux disease)   . Headache(784.0)    "related to my high blood pressure" (09/09/2012)  . History of DVT (deep vein thrombosis)    "RLE" (09/09/2012) coumadine cant take asa so on plavix   . Hyperlipidemia   . Hypertension   . Lupus    "cured years ago" (09/09/2012)  . Varicose veins     Family History  Problem Relation Age of Onset  . Hypertension Mother   . Breast cancer Mother   . Hypertension Father   . Cancer Sister        Colon and Breast Cancer  . Breast cancer Sister   . Hypertension Brother   . Heart attack Daughter   . Lung cancer Unknown        both parents     Social History   Social History  . Marital status: Single    Spouse name: N/A  . Number of children: N/A  . Years of education: N/A   Social History Main Topics  . Smoking status: Never Smoker  . Smokeless tobacco: Never Used  . Alcohol use No  . Drug use: No  . Sexual activity: Not on file   Other Topics Concern  . Not on file   Social History Narrative   2 people living in the home.  Grand daughter.   Up and down through the night      Had 7 children  2 deceased . 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    works 3 days per week currently .   Glasses dentures  Neg tad     Outpatient Medications Prior to Visit  Medication Sig Dispense Refill  . acetaminophen (TYLENOL) 500 MG tablet Take 1,000 mg by mouth every 6 (six) hours as needed for moderate pain. Reported on 04/30/2015    .  acetaminophen-codeine (TYLENOL #3) 300-30 MG tablet Take 1 tablet by mouth every 12 (twelve) hours as needed for moderate pain. 30 tablet 0  . ADVAIR DISKUS 250-50 MCG/DOSE AEPB USE 1 PUFF TWICE DAILY. 60 each 5  . albuterol (PROVENTIL) (2.5 MG/3ML) 0.083% nebulizer solution USE 1 AMPULE EVERY 6 HOURS AS NEEDED FOR WHEEZE 75 mL 0  . citalopram (CELEXA) 10 MG tablet TAKE 1&1/2 TABLETS DAILY FOR ANXIETY/PANIC. 45 tablet 0  . clopidogrel (PLAVIX) 75 MG tablet TAKE 1 TABLET ONCE DAILY. 30 tablet 5  . diclofenac sodium (VOLTAREN) 1 % GEL APPLY 4GM TO AFFECTED AREA(S) 4 TIMES A DAY. 100 g 1  . doxazosin (CARDURA) 2 MG tablet TAKE 3 TABLETS AT BEDTIME. 90 tablet 0  . doxycycline (VIBRA-TABS) 100 MG tablet Take 1 tablet (100 mg total) by mouth 2 (two) times daily. 20 tablet 0  . fenofibrate 160 MG tablet TAKE 1 TABLET ONCE DAILY. 90 tablet 1  . ferrous sulfate 325 (65 FE) MG EC tablet Take 1 tablet (325 mg total) by mouth daily with breakfast.  30 tablet 3  . furosemide (LASIX) 20 MG tablet Take 1 tablet (20 mg total) by mouth 2 (two) times daily. 65 tablet 5  . HYDROcodone-acetaminophen (NORCO/VICODIN) 5-325 MG tablet 1 po q d prn pain 40 tablet 0  . lidocaine (LIDODERM) 5 % Place 1 patch onto the skin daily. Remove & Discard patch within 12 hours or as directed by MD 30 patch 0  . metoprolol succinate (TOPROL-XL) 50 MG 24 hr tablet TAKE 1 TABLET ONCE DAILY WITH FOOD. 30 tablet 0  . montelukast (SINGULAIR) 10 MG tablet TAKE 1 TABLET ONCE DAILY. 90 tablet 0  . Olmesartan-Amlodipine-HCTZ 40-10-25 MG TABS TAKE 1 TABLET ONCE DAILY. 30 tablet 5  . pantoprazole (PROTONIX) 40 MG tablet TAKE 1 TABLET ONCE DAILY. 30 tablet 0  . potassium chloride (K-DUR) 10 MEQ tablet TAKE 4 TABLETS DAILY. 120 tablet 0  . predniSONE (DELTASONE) 20 MG tablet Take two tablets once daily with food 10 tablet 0  . predniSONE (STERAPRED UNI-PAK 21 TAB) 10 MG (21) TBPK tablet Take 6-5-4-3-2-1 po qd 21 tablet 0  . PROAIR HFA 108 (90  Base) MCG/ACT inhaler USE 2 PUFFS EVERY 6 HOURS AS NEEDED FOR WHEEZING. 8.5 g 2  . RESTASIS 0.05 % ophthalmic emulsion     . Spacer/Aero-Holding Chambers (AEROCHAMBER PLUS) inhaler Use as instructed 1 each 2  . traMADol (ULTRAM) 50 MG tablet Take one to two po q 6 hours prn pain 24 tablet 0  . vitamin B-12 (CYANOCOBALAMIN) 1000 MCG tablet Take 1,000 mcg by mouth daily.     No facility-administered medications prior to visit.      EXAM:  There were no vitals taken for this visit.  There is no height or weight on file to calculate BMI.  GENERAL: vitals reviewed and listed above, alert, oriented, appears well hydrated and in no acute distress HEENT: atraumatic, conjunctiva  clear, no obvious abnormalities on inspection of external nose and ears OP : no lesion edema or exudate  NECK: no obvious masses on inspection palpation  LUNGS: clear to auscultation bilaterally, no wheezes, rales or rhonchi, good air movement CV: HRRR, no clubbing cyanosis or  peripheral edema nl cap refill  MS: moves all extremities without noticeable focal  abnormality PSYCH: pleasant and cooperative, no obvious depression or anxiety Lab Results  Component Value Date   WBC 4.2 10/06/2016   HGB 10.3 (L) 10/06/2016   HCT 31.0 (L) 10/06/2016   PLT 240.0 10/06/2016   GLUCOSE 110 (H) 11/10/2016   CHOL 182 04/25/2016   TRIG 83.0 04/25/2016   HDL 69.70 04/25/2016   LDLCALC 96 04/25/2016   ALT 14 06/19/2016   AST 22 06/19/2016   NA 139 11/10/2016   K 3.9 11/10/2016   CL 100 11/10/2016   CREATININE 1.63 (H) 11/10/2016   BUN 27 (H) 11/10/2016   CO2 32 11/10/2016   TSH 2.29 12/27/2015   INR 0.94 12/22/2009   HGBA1C 6.2 10/06/2016   MICROALBUR 10.7 (H) 04/25/2016   BP Readings from Last 3 Encounters:  02/07/17 (!) 156/69  01/04/17 (!) 156/68  12/25/16 (!) 148/78    ASSESSMENT AND PLAN:  Discussed the following assessment and plan:  No diagnosis found.  -Patient advised to return or notify health care  team  if  new concerns arise.  There are no Patient Instructions on file for this visit.   Standley Brooking. Panosh M.D.

## 2017-03-09 ENCOUNTER — Ambulatory Visit: Payer: Medicare Other | Admitting: Internal Medicine

## 2017-03-09 NOTE — Progress Notes (Signed)
Chief Complaint  Patient presents with  . Follow-up    pt having stomach issues - burning in stomach after eating. Pt states that sometimes the burning is so bad that she cannot finish eating. Pt notes feeling full    HPI: [name] 83 y.o. come in for Chronic disease management but particularly battling abdominal symptoms. She is running out of Protonix but also notes that she is having postprandial epigastric burning and also lower midline chest and feels like food is getting caught in her high epigastrium at times and can only eat a few bites of food.  No vomiting. No major changes in bowel or UTI symptoms. She will be traveling to [facility] and been using her nebulizer with some success in the mornings.  Remote history of esophageal stricture with dilatation under Dr. Ricky Stabs care a number of years ago.   This will be her first time traveling on a plane asks about increasing the anxiety medicine from 15 mg to the next dose 20 mg. ROS: See pertinent positives and negatives per HPI.  No fever urinary infection affection symptoms or unintended weight loss.  Past Medical History:  Diagnosis Date  . Allergy   . Anemia   . Anxiety   . Arthritis    "shoulders" (09/09/2012)  . Asthma    " onset when young"   . Diabetes mellitus without complication ([place])    "Borderline" per pt; being monitored  . GERD (gastroesophageal reflux disease)   . Headache(784.0)    "related to my high blood pressure" (09/09/2012)  . History of DVT (deep vein thrombosis)    "RLE" (09/09/2012) coumadine cant take asa so on plavix   . Hyperlipidemia   . Hypertension   . Lupus    "cured years ago" (09/09/2012)  . Varicose veins     Family History  Problem Relation Age of Onset  . Hypertension Mother   . Breast cancer Mother   . Hypertension Father   . Cancer Sister        Colon and Breast Cancer  . Breast cancer Sister   . Hypertension Brother   . Heart attack Daughter   . Lung cancer  Unknown        both parents     Social History   Social History  . Marital status: Single    Spouse name: N/A  . Number of children: N/A  . Years of education: N/A   Social History Main Topics  . Smoking status: Never Smoker  . Smokeless tobacco: Never Used  . Alcohol use No  . Drug use: No  . Sexual activity: Not Asked   Other Topics Concern  . None   Social History Narrative   2 people living in the home.  Grand daughter.   Up and down through the night      Had 7 children  2 deceased . 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    works 3 days per week currently .   Glasses dentures  Neg tad     Outpatient Medications Prior to Visit  Medication Sig Dispense Refill  . acetaminophen (TYLENOL) 500 MG tablet Take 1,000 mg by mouth every 6 (six) hours as needed for moderate pain. Reported on 04/30/2015    . acetaminophen-codeine (TYLENOL #3) 300-30 MG tablet Take 1 tablet by mouth every 12 (twelve) hours as needed for moderate pain.  30 tablet 0  . ADVAIR DISKUS 250-50 MCG/DOSE AEPB USE 1 PUFF TWICE DAILY. 60 each 5  . albuterol (PROVENTIL) (2.5 MG/3ML) 0.083% nebulizer solution USE 1 AMPULE EVERY 6 HOURS AS NEEDED FOR WHEEZE 75 mL 0  . clopidogrel (PLAVIX) 75 MG tablet TAKE 1 TABLET ONCE DAILY. 30 tablet 5  . diclofenac sodium (VOLTAREN) 1 % GEL APPLY 4GM TO AFFECTED AREA(S) 4 TIMES A DAY. 100 g 1  . doxazosin (CARDURA) 2 MG tablet TAKE 3 TABLETS AT BEDTIME. 90 tablet 0  . fenofibrate 160 MG tablet TAKE 1 TABLET ONCE DAILY. 90 tablet 1  . ferrous sulfate 325 (65 FE) MG EC tablet Take 1 tablet (325 mg total) by mouth daily with breakfast. 30 tablet 3  . furosemide (LASIX) 20 MG tablet Take 1 tablet (20 mg total) by mouth 2 (two) times daily. 65 tablet 5  . HYDROcodone-acetaminophen (NORCO/VICODIN) 5-325 MG tablet 1 po q d prn pain 40 tablet 0  . lidocaine (LIDODERM) 5 % Place 1 patch onto the skin  daily. Remove & Discard patch within 12 hours or as directed by MD 30 patch 0  . metoprolol succinate (TOPROL-XL) 50 MG 24 hr tablet TAKE 1 TABLET ONCE DAILY WITH FOOD. 30 tablet 0  . montelukast (SINGULAIR) 10 MG tablet TAKE 1 TABLET ONCE DAILY. 90 tablet 0  . Olmesartan-Amlodipine-HCTZ 40-10-25 MG TABS TAKE 1 TABLET ONCE DAILY. 30 tablet 5  . potassium chloride (K-DUR) 10 MEQ tablet TAKE 4 TABLETS DAILY. 120 tablet 0  . PROAIR HFA 108 (90 Base) MCG/ACT inhaler USE 2 PUFFS EVERY 6 HOURS AS NEEDED FOR WHEEZING. 8.5 g 2  . RESTASIS 0.05 % ophthalmic emulsion     . Spacer/Aero-Holding Chambers (AEROCHAMBER PLUS) inhaler Use as instructed 1 each 2  . traMADol (ULTRAM) 50 MG tablet Take one to two po q 6 hours prn pain 24 tablet 0  . vitamin B-12 (CYANOCOBALAMIN) 1000 MCG tablet Take 1,000 mcg by mouth daily.    . citalopram (CELEXA) 10 MG tablet TAKE 1&1/2 TABLETS DAILY FOR ANXIETY/PANIC. 45 tablet 0  . pantoprazole (PROTONIX) 40 MG tablet TAKE 1 TABLET ONCE DAILY. 30 tablet 0  . predniSONE (STERAPRED UNI-PAK 21 TAB) 10 MG (21) TBPK tablet Take 6-5-4-3-2-1 po qd 21 tablet 0  . doxycycline (VIBRA-TABS) 100 MG tablet Take 1 tablet (100 mg total) by mouth 2 (two) times daily. (Patient not taking: Reported on 03/12/2017) 20 tablet 0  . predniSONE (DELTASONE) 20 MG tablet Take two tablets once daily with food (Patient not taking: Reported on 03/12/2017) 10 tablet 0   No facility-administered medications prior to visit.      EXAM:  BP (!) 142/76 (BP Location: Left Arm, Patient Position: Sitting, Cuff Size: Normal)   Pulse 96   Temp 98 F (36.7 C) (Oral)   Ht 5' (1.524 m)   Wt 181 lb 9.6 oz (82.4 kg)   BMI 35.47 kg/m   Body mass index is 35.47 kg/m.  GENERAL: vitals reviewed and listed above, alert, oriented, appears well hydrated and in no acute distress HEENT: atraumatic, conjunctiva  clear, no obvious abnormalities on inspection of external nose and ears  NECK: no obvious masses on  inspection palpation  LUNGS: clear to auscultation bilaterally, no wheezes, rales or rhonchi, good air movement Abdomen:  Sof,t normal bowel sounds without hepatosplenomegaly, no guarding rebound or masses no CVA tenderness points to the high epigastric area as the area of concern. CV: HRRR, no clubbing cyanosis 1+ chronic peripheral edema  nl cap refill  MS: moves all extremities without noticeable focal  abnormality PSYCH: pleasant and cooperative, no obvious depression or anxiety Lab Results  Component Value Date   WBC 4.2 10/06/2016   HGB 10.3 (L) 10/06/2016   HCT 31.0 (L) 10/06/2016   PLT 240.0 10/06/2016   GLUCOSE 110 (H) 11/10/2016   CHOL 182 04/25/2016   TRIG 83.0 04/25/2016   HDL 69.70 04/25/2016   LDLCALC 96 04/25/2016   ALT 14 06/19/2016   AST 22 06/19/2016   NA 139 11/10/2016   K 3.9 11/10/2016   CL 100 11/10/2016   CREATININE 1.63 (H) 11/10/2016   BUN 27 (H) 11/10/2016   CO2 32 11/10/2016   TSH 2.29 12/27/2015   INR 0.94 12/22/2009   HGBA1C 6.2 10/06/2016   MICROALBUR 10.7 (H) 04/25/2016   BP Readings from Last 3 Encounters:  03/12/17 (!) 142/76  02/07/17 (!) 156/69  01/04/17 (!) 156/68    ASSESSMENT AND PLAN:  Discussed the following assessment and plan:  epigastric postprandial abdominal pain - burning  - Plan: Ambulatory referral to Gastroenterology  Essential hypertension - Plan: CBC with Differential/Platelet, Basic metabolic panel, Hemoglobin A1c  Medication management - Plan: CBC with Differential/Platelet, Basic metabolic panel, Hemoglobin A1c  Renal insufficiency creatinine 1.3 - Plan: CBC with Differential/Platelet, Basic metabolic panel, Hemoglobin A1c  Anemia of chronic disease - Plan: CBC with Differential/Platelet, Basic metabolic panel, Hemoglobin A1c  Abdominal pain, unspecified abdominal location - Plan: CBC with Differential/Platelet, Basic metabolic panel, Hemoglobin A1c, Ambulatory referral to Gastroenterology  Need for influenza  vaccination - Plan: Flu vaccine HIGH DOSE PF (Fluzone High dose)  Gastroesophageal reflux disease, esophagitis presence not specified - Plan: CBC with Differential/Platelet, Basic metabolic panel, Hemoglobin A1c, Ambulatory referral to Gastroenterology  History of esophageal stricture - Plan: Ambulatory referral to Gastroenterology  Early satiety  Anxiety state - Increase citalopram to 20 mg a day typical dosing sent to pharmacy. Hx of stricture  esophageal although her symptoms are a bit lower possibly at the GE junction.  No other alarm symptoms such as weight loss and vomiting however she is on daily PPI.  At this point would be best to get GI consult for further evaluation question endoscopy question upper GI or other imaging. We will increase Protonix to twice daily for now contact us if worsening in the meantime.    Prev patient of dr Olevia Perches   Plan preventive visit CPX in 3-4 months or as appropriate. -Patient advised to return or notify health care team  if  new concerns arise.  Patient Instructions  Increase the Protonix to twice a day temporarily to see if it helps your burning stomach. Will refer to gastroenterology for further evaluation since Dr. Maurene Capes is retired you will see a new physician.  I will send in citalopram 20 mg to take 1 a day as an increased dose for the antianxiety.  Checking blood work today blood sugar kidney function anemia check for stability. You should be able to travel with your nebulizer machine but need to contact the airlines about specifics.  Make sure you take your medicine and pills with water to avoid them irritating her esophagus.  Plan follow-up visit depending on how you were doing   CPX in 3-4 months       Tyyne Cliett K. Ardith Lewman M.D.

## 2017-03-12 ENCOUNTER — Ambulatory Visit (INDEPENDENT_AMBULATORY_CARE_PROVIDER_SITE_OTHER): Payer: Medicare Other | Admitting: Internal Medicine

## 2017-03-12 ENCOUNTER — Encounter: Payer: Self-pay | Admitting: Internal Medicine

## 2017-03-12 ENCOUNTER — Ambulatory Visit: Payer: Medicare Other | Admitting: Internal Medicine

## 2017-03-12 VITALS — BP 142/76 | HR 96 | Temp 98.0°F | Ht 60.0 in | Wt 181.6 lb

## 2017-03-12 DIAGNOSIS — Z79899 Other long term (current) drug therapy: Secondary | ICD-10-CM | POA: Diagnosis not present

## 2017-03-12 DIAGNOSIS — K219 Gastro-esophageal reflux disease without esophagitis: Secondary | ICD-10-CM

## 2017-03-12 DIAGNOSIS — I10 Essential (primary) hypertension: Secondary | ICD-10-CM

## 2017-03-12 DIAGNOSIS — F411 Generalized anxiety disorder: Secondary | ICD-10-CM | POA: Diagnosis not present

## 2017-03-12 DIAGNOSIS — M329 Systemic lupus erythematosus, unspecified: Secondary | ICD-10-CM | POA: Insufficient documentation

## 2017-03-12 DIAGNOSIS — R109 Unspecified abdominal pain: Secondary | ICD-10-CM

## 2017-03-12 DIAGNOSIS — L930 Discoid lupus erythematosus: Secondary | ICD-10-CM

## 2017-03-12 DIAGNOSIS — N289 Disorder of kidney and ureter, unspecified: Secondary | ICD-10-CM | POA: Diagnosis not present

## 2017-03-12 DIAGNOSIS — R1084 Generalized abdominal pain: Secondary | ICD-10-CM

## 2017-03-12 DIAGNOSIS — D638 Anemia in other chronic diseases classified elsewhere: Secondary | ICD-10-CM | POA: Diagnosis not present

## 2017-03-12 DIAGNOSIS — Z23 Encounter for immunization: Secondary | ICD-10-CM | POA: Diagnosis not present

## 2017-03-12 DIAGNOSIS — R6881 Early satiety: Secondary | ICD-10-CM

## 2017-03-12 DIAGNOSIS — Z8719 Personal history of other diseases of the digestive system: Secondary | ICD-10-CM

## 2017-03-12 LAB — CBC WITH DIFFERENTIAL/PLATELET
Basophils Absolute: 0 10*3/uL (ref 0.0–0.1)
Basophils Relative: 0.9 % (ref 0.0–3.0)
EOS ABS: 0.1 10*3/uL (ref 0.0–0.7)
Eosinophils Relative: 3 % (ref 0.0–5.0)
HEMATOCRIT: 29.4 % — AB (ref 36.0–46.0)
Hemoglobin: 9.7 g/dL — ABNORMAL LOW (ref 12.0–15.0)
LYMPHS ABS: 1.2 10*3/uL (ref 0.7–4.0)
Lymphocytes Relative: 32.7 % (ref 12.0–46.0)
MCHC: 32.8 g/dL (ref 30.0–36.0)
MCV: 87.8 fl (ref 78.0–100.0)
Monocytes Absolute: 0.3 10*3/uL (ref 0.1–1.0)
Monocytes Relative: 9 % (ref 3.0–12.0)
NEUTROS ABS: 1.9 10*3/uL (ref 1.4–7.7)
NEUTROS PCT: 54.4 % (ref 43.0–77.0)
PLATELETS: 235 10*3/uL (ref 150.0–400.0)
RBC: 3.35 Mil/uL — ABNORMAL LOW (ref 3.87–5.11)
RDW: 13.5 % (ref 11.5–15.5)
WBC: 3.6 10*3/uL — ABNORMAL LOW (ref 4.0–10.5)

## 2017-03-12 LAB — BASIC METABOLIC PANEL
BUN: 26 mg/dL — ABNORMAL HIGH (ref 6–23)
CHLORIDE: 99 meq/L (ref 96–112)
CO2: 30 mEq/L (ref 19–32)
Calcium: 9.6 mg/dL (ref 8.4–10.5)
Creatinine, Ser: 1.51 mg/dL — ABNORMAL HIGH (ref 0.40–1.20)
GFR: 42.52 mL/min — ABNORMAL LOW (ref >60.00)
Glucose, Bld: 109 mg/dL — ABNORMAL HIGH (ref 70–99)
POTASSIUM: 4.1 meq/L (ref 3.5–5.1)
SODIUM: 138 meq/L (ref 135–145)

## 2017-03-12 LAB — HEMOGLOBIN A1C: HEMOGLOBIN A1C: 6 % (ref 4.6–6.5)

## 2017-03-12 MED ORDER — CITALOPRAM HYDROBROMIDE 20 MG PO TABS: 20.0000 mg | ORAL_TABLET | Freq: Every day | ORAL | 5 refills | Status: DC

## 2017-03-12 MED ORDER — PANTOPRAZOLE SODIUM 40 MG PO TBEC: 40.0000 mg | DELAYED_RELEASE_TABLET | Freq: Two times a day (BID) | ORAL | 1 refills | Status: DC

## 2017-03-12 NOTE — Patient Instructions (Addendum)
Increase the Protonix to twice a day temporarily to see if it helps your burning stomach. Will refer to gastroenterology for further evaluation since Dr. Maurene Capes is retired you will see a new physician.  I will send in citalopram 20 mg to take 1 a day as an increased dose for the antianxiety.  Checking blood work today blood sugar kidney function anemia check for stability. You should be able to travel with your nebulizer machine but need to contact the airlines about specifics.  Make sure you take your medicine and pills with water to avoid them irritating her esophagus.  Plan follow-up visit depending on how you were doing   CPX in 3-4 months

## 2017-03-15 ENCOUNTER — Other Ambulatory Visit: Payer: Self-pay | Admitting: Internal Medicine

## 2017-03-30 DIAGNOSIS — H04123 Dry eye syndrome of bilateral lacrimal glands: Secondary | ICD-10-CM | POA: Diagnosis not present

## 2017-03-30 DIAGNOSIS — H40023 Open angle with borderline findings, high risk, bilateral: Secondary | ICD-10-CM | POA: Diagnosis not present

## 2017-04-09 ENCOUNTER — Other Ambulatory Visit: Payer: Self-pay | Admitting: Internal Medicine

## 2017-04-12 ENCOUNTER — Encounter: Payer: Self-pay | Admitting: Gastroenterology

## 2017-04-17 ENCOUNTER — Ambulatory Visit: Payer: Medicare Other | Admitting: Gastroenterology

## 2017-04-17 ENCOUNTER — Encounter: Payer: Self-pay | Admitting: Gastroenterology

## 2017-04-17 VITALS — BP 158/70 | HR 60 | Ht 60.0 in | Wt 171.4 lb

## 2017-04-17 DIAGNOSIS — R634 Abnormal weight loss: Secondary | ICD-10-CM | POA: Diagnosis not present

## 2017-04-17 DIAGNOSIS — R110 Nausea: Secondary | ICD-10-CM | POA: Diagnosis not present

## 2017-04-17 DIAGNOSIS — G8929 Other chronic pain: Secondary | ICD-10-CM

## 2017-04-17 DIAGNOSIS — R1013 Epigastric pain: Secondary | ICD-10-CM

## 2017-04-17 NOTE — Progress Notes (Signed)
[facility] Consult Note:  History: [name] 04/17/2017  Referring physician: Burnis Medin, MD  Reason for consult/chief complaint: Abdominal Pain (x 2 months, epigastric pain when she eats, nausea and loss of appetite)   Subjective  HPI:  This is an 83 year old woman referred by primary care noted above for recent onset upper abdominal pain.  It is been going on for between 2 and 3 months.  She has a dull, gnawing and sometimes burning epigastric pain that is nonradiating with associated nausea but no vomiting.  There is occasional regurgitation.  Her appetite is decreased.  She had not noticed weight loss, and records indicate that her weight was stable at office visits with her primary care provider up until late October.  Weight is down 10 pounds in that visit to today, but they are on different scales. She has occasional constipation without rectal bleeding.  [place] had an upper endoscopy with Dr. Maurene Capes for dysphagia in November 2014.  A distal stricture was dilated, exam was otherwise unremarkable.  ROS:  Review of Systems  Constitutional: Negative for appetite change and unexpected weight change.  HENT: Negative for mouth sores and voice change.   Eyes: Negative for pain and redness.  Respiratory: Negative for cough and shortness of breath.   Cardiovascular: Negative for chest pain and palpitations.  Genitourinary: Negative for dysuria and hematuria.  Musculoskeletal: Positive for arthralgias. Negative for myalgias.  Skin: Negative for pallor and rash.  Neurological: Negative for weakness and headaches.  Hematological: Negative for adenopathy.    Wt stable until 10/29 PCP visit, then down 10# now (not same scale) Past Medical History: Past Medical History:  Diagnosis Date  . Allergy   . Anemia   . Anxiety   . Arthritis    "shoulders" (09/09/2012)  . Asthma    " onset when young"   . Diabetes mellitus without complication ([place])    "Borderline" per pt; being monitored  . GERD (gastroesophageal reflux disease)   . Headache(784.0)    "related to my high blood pressure" (09/09/2012)  . History of DVT (deep vein thrombosis)    "RLE" (09/09/2012) coumadine cant take asa so on plavix   . Hyperlipidemia   . Hypertension   . Lupus    "cured years ago" (09/09/2012)  . Varicose veins    It sounds that she may have been on Coumadin at some point for DVT, then was switched to Plavix.  I reviewed the last 2 primary care office notes.   Past Surgical History: Past Surgical History:  Procedure Laterality Date  . ABDOMINAL HYSTERECTOMY     partial  . BREAST EXCISIONAL BIOPSY Right   . BREAST SURGERY    . CARPAL TUNNEL RELEASE Right 2000's  . CARPAL TUNNEL RELEASE Bilateral 10/21/2013   Procedure: BILATERAL  CARPAL TUNNEL RELEASE;  Surgeon: [name], MD;  Location: [facility];  Service: Orthopedics;  Laterality: Bilateral;  . SHOULDER OPEN ROTATOR CUFF REPAIR Bilateral 2000's  . TRIGGER FINGER RELEASE Right 10/21/2013   Procedure: RELEASE TRIGGER FINGER/A-1 PULLEY RIGHT MIDDLE AND RIGHT RING;  Surgeon: [name], MD;  Location: [facility];  Service: Orthopedics;  Laterality: Right;     Family History: Family History  Problem Relation Age of Onset  . Hypertension Mother   . Breast cancer Mother   . Hypertension Father   . Cancer Sister        Colon and Breast Cancer  . Breast cancer Sister   .  Hypertension Brother   . Heart attack Daughter   . Lung cancer Unknown        both parents     Social History: Social History   Socioeconomic History  . Marital status: Single    Spouse name: None  . Number of children: 6  . Years of education: None  . Highest education level: None  Social Needs  . Financial resource strain: None  . Food insecurity - worry: None  . Food insecurity - inability: None  . Transportation needs - medical: None  . Transportation needs - non-medical: None    Occupational History  . None  Tobacco Use  . Smoking status: Never Smoker  . Smokeless tobacco: Never Used  Substance and Sexual Activity  . Alcohol use: No  . Drug use: No  . Sexual activity: None  Other Topics Concern  . None  Social History Narrative   2 people living in the home.  Grand daughter.   Up and down through the night      Had 7 children  2 deceased . 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    works 3 days per week currently .   Glasses dentures  Neg tad     Allergies: Allergies  Allergen Reactions  . Penicillins Nausea And Vomiting and Other (See Comments)    Wheezing  . Aspirin Other (See Comments)    Wheezing Acetaminophen is OK    Outpatient Meds: Current Outpatient Medications  Medication Sig Dispense Refill  . acetaminophen (TYLENOL) 500 MG tablet Take 1,000 mg by mouth every 6 (six) hours as needed for moderate pain. Reported on 04/30/2015    . ADVAIR DISKUS 250-50 MCG/DOSE AEPB USE 1 PUFF TWICE DAILY. 60 each 5  . albuterol (PROVENTIL) (2.5 MG/3ML) 0.083% nebulizer solution USE 1 AMPULE EVERY 6 HOURS AS NEEDED FOR WHEEZE 75 mL 0  . citalopram (CELEXA) 20 MG tablet Take 1 tablet (20 mg total) by mouth daily. 30 tablet 5  . clopidogrel (PLAVIX) 75 MG tablet TAKE 1 TABLET ONCE DAILY. 30 tablet 5  . diclofenac sodium (VOLTAREN) 1 % GEL APPLY 4GM TO AFFECTED AREA(S) 4 TIMES A DAY. 100 g 1  . doxazosin (CARDURA) 2 MG tablet TAKE 3 TABLETS AT BEDTIME. 90 tablet 1  . fenofibrate 160 MG tablet TAKE 1 TABLET ONCE DAILY. 90 tablet 1  . ferrous sulfate 325 (65 FE) MG EC tablet Take 1 tablet (325 mg total) by mouth daily with breakfast. 30 tablet 3  . furosemide (LASIX) 20 MG tablet TAKE 2 TABLETS IN THE MORNING AND 1 TAB IN EVENING X5DAYS, THEN GO BACK TO 1 TAB 2XDAY. 60 tablet 0  . lidocaine (LIDODERM) 5 % Place 1 patch onto the skin daily. Remove & Discard patch within 12 hours or as  directed by MD 30 patch 0  . metoprolol succinate (TOPROL-XL) 50 MG 24 hr tablet TAKE 1 TABLET ONCE DAILY WITH FOOD. 30 tablet 0  . montelukast (SINGULAIR) 10 MG tablet TAKE 1 TABLET ONCE DAILY. 90 tablet 0  . Olmesartan-Amlodipine-HCTZ 40-10-25 MG TABS TAKE 1 TABLET ONCE DAILY. 30 tablet 5  . pantoprazole (PROTONIX) 40 MG tablet Take 1 tablet (40 mg total) by mouth 2 (two) times daily before a meal. 60 tablet 1  . potassium chloride (K-DUR) 10 MEQ tablet TAKE 4 TABLETS DAILY. 120 tablet 0  . PROAIR HFA 108 (90 Base)  MCG/ACT inhaler USE 2 PUFFS EVERY 6 HOURS AS NEEDED FOR WHEEZING. 8.5 g 2  . RESTASIS 0.05 % ophthalmic emulsion     . Spacer/Aero-Holding Chambers (AEROCHAMBER PLUS) inhaler Use as instructed 1 each 2  . traMADol (ULTRAM) 50 MG tablet Take one to two po q 6 hours prn pain 24 tablet 0  . vitamin B-12 (CYANOCOBALAMIN) 1000 MCG tablet Take 1,000 mcg by mouth daily.     No current facility-administered medications for this visit.       ___________________________________________________________________ Objective   Exam:  BP (!) 158/70   Pulse 60   Ht 5' (1.524 m)   Wt 171 lb 6.4 oz (77.7 kg)   BMI 33.47 kg/m    General: this is a(n) elderly woman who is not acutely ill appearing.  She has a somewhat decreased affect, her daughter is present for the entire encounter.  Eyes: sclera anicteric, no redness  ENT: oral mucosa moist without lesions, no cervical or supraclavicular lymphadenopathy, good dentition  CV: RRR without murmur, S1/S2, no JVD, 3+ peripheral edema  Resp: clear to auscultation bilaterally, normal RR and effort noted  GI: soft, moderate epigastric tenderness, with active bowel sounds. No guarding or palpable organomegaly noted.  There is a large rectus diastasis.  Patient felt quite nauseated for several minutes after palpating her abdomen.  Skin; warm and dry, no rash or jaundice noted  Neuro: awake, alert and oriented x 3. Normal gross motor  function and fluent speech  Labs:  CBC Latest Ref Rng & Units 03/12/2017 10/06/2016 06/19/2016  WBC 4.0 - 10.5 K/uL 3.6(L) 4.2 4.4  Hemoglobin 12.0 - 15.0 g/dL 9.7(L) 10.3(L) 10.6(L)  Hematocrit 36.0 - 46.0 % 29.4(L) 31.0(L) 32.9(L)  Platelets 150.0 - 400.0 K/uL 235.0 240.0 194   CMP Latest Ref Rng & Units 03/12/2017 11/10/2016 10/06/2016  Glucose 70 - 99 mg/dL 109(H) 110(H) 107(H)  BUN 6 - 23 mg/dL 26(H) 27(H) 33(H)  Creatinine 0.40 - 1.20 mg/dL 1.51(H) 1.63(H) 1.66(H)  Sodium 135 - 145 mEq/L 138 139 140  Potassium 3.5 - 5.1 mEq/L 4.1 3.9 3.7  Chloride 96 - 112 mEq/L 99 100 101  CO2 19 - 32 mEq/L 30 32 32  Calcium 8.4 - 10.5 mg/dL 9.6 9.7 9.7  Total Protein 6.4 - 8.3 g/dL - - -  Total Bilirubin 0.20 - 1.20 mg/dL - - -  Alkaline Phos 40 - 150 U/L - - -  AST 5 - 34 U/L - - -  ALT 0 - 55 U/L - - -   Last echocardiogram from 2014 shows normal EF, grade 1 diastolic dysfunction  Radiologic Studies:  No recent abdominal imaging  Assessment: Encounter Diagnoses  Name Primary?  . Abdominal pain, chronic, epigastric Yes  . Nausea without vomiting   . Abnormal loss of weight     Petra Kuba of the symptoms is unclear, but this is a definite change for her, she is fairly tender on exam and this weight loss could be accurate.  Must rule out H. pylori gastritis, peptic ulcer, malignancy, obstruction  Plan:  Upper endoscopy next week.  She will be off Plavix 5 days prior, I think the risk of DVT while off antiplatelet therapy during that time is low.  The benefits and risks of the planned procedure were described in detail with the patient or (when appropriate) their health care proxy.  Risks were outlined as including, but not limited to, bleeding, infection, perforation, adverse medication reaction leading to cardiac or pulmonary decompensation, or  pancreatitis (if ERCP).  The limitation of incomplete mucosal visualization was also discussed.  No guarantees or warranties were given.  Thank  you for the courtesy of this consult.  Please call me with any questions or concerns.  Nelida Meuse III  CC: Panosh, Standley Brooking, MD

## 2017-04-17 NOTE — Patient Instructions (Signed)
If you are age 81 or older, your body mass index should be between 23-30. Your Body mass index is 33.47 kg/m. If this is out of the aforementioned range listed, please consider follow up with your Primary Care Provider.  If you are age 54 or younger, your body mass index should be between 19-25. Your Body mass index is 33.47 kg/m. If this is out of the aformentioned range listed, please consider follow up with your Primary Care Provider.   You have been scheduled for an endoscopy. Please follow written instructions given to you at your visit today. If you use inhalers (even only as needed), please bring them with you on the day of your procedure. Your physician has requested that you go to www.startemmi.com and enter the access code given to you at your visit today. This web site gives a general overview about your procedure. However, you should still follow specific instructions given to you by our office regarding your preparation for the procedure.  Thank you for choosing [facility]  Dr Wilfrid Lund III

## 2017-04-19 ENCOUNTER — Other Ambulatory Visit: Payer: Self-pay | Admitting: Internal Medicine

## 2017-04-26 ENCOUNTER — Other Ambulatory Visit: Payer: Self-pay

## 2017-04-26 ENCOUNTER — Encounter: Payer: Self-pay | Admitting: Gastroenterology

## 2017-04-26 ENCOUNTER — Ambulatory Visit (AMBULATORY_SURGERY_CENTER): Payer: Medicare Other | Admitting: Gastroenterology

## 2017-04-26 ENCOUNTER — Telehealth: Payer: Self-pay | Admitting: Gastroenterology

## 2017-04-26 VITALS — BP 140/54 | HR 56 | Temp 98.7°F | Resp 12 | Ht 60.0 in | Wt 171.0 lb

## 2017-04-26 DIAGNOSIS — K2950 Unspecified chronic gastritis without bleeding: Secondary | ICD-10-CM | POA: Diagnosis not present

## 2017-04-26 DIAGNOSIS — R1013 Epigastric pain: Secondary | ICD-10-CM | POA: Diagnosis present

## 2017-04-26 DIAGNOSIS — R1084 Generalized abdominal pain: Secondary | ICD-10-CM

## 2017-04-26 DIAGNOSIS — R634 Abnormal weight loss: Secondary | ICD-10-CM | POA: Diagnosis not present

## 2017-04-26 DIAGNOSIS — K2970 Gastritis, unspecified, without bleeding: Secondary | ICD-10-CM | POA: Diagnosis not present

## 2017-04-26 MED ORDER — SODIUM CHLORIDE 0.9 % IV SOLN: 500.0000 mL | INTRAVENOUS | Status: DC

## 2017-04-26 NOTE — Progress Notes (Signed)
Report given to PACU, vss 

## 2017-04-26 NOTE — Telephone Encounter (Signed)
Spoke to patient and her daughter. Instructions have been given.

## 2017-04-26 NOTE — Progress Notes (Signed)
Pt's states no medical or surgical changes since previsit or office visit. 

## 2017-04-26 NOTE — Patient Instructions (Signed)
YOU HAD AN ENDOSCOPIC PROCEDURE TODAY AT [facility]:   Refer to the procedure report that was given to you for any specific questions about what was found during the examination.  If the procedure report does not answer your questions, please call your gastroenterologist to clarify.  If you requested that your care partner not be given the details of your procedure findings, then the procedure report has been included in a sealed envelope for you to review at your convenience later.  YOU SHOULD EXPECT: Some feelings of bloating in the abdomen. Passage of more gas than usual.  Walking can help get rid of the air that was put into your GI tract during the procedure and reduce the bloating. If you had a lower endoscopy (such as a colonoscopy or flexible sigmoidoscopy) you may notice spotting of blood in your stool or on the toilet paper. If you underwent a bowel prep for your procedure, you may not have a normal bowel movement for a few days.  Please Note:  You might notice some irritation and congestion in your nose or some drainage.  This is from the oxygen used during your procedure.  There is no need for concern and it should clear up in a day or so.  SYMPTOMS TO REPORT IMMEDIATELY:     Following upper endoscopy (EGD)  Vomiting of blood or coffee ground material  New chest pain or pain under the shoulder blades  Painful or persistently difficult swallowing  New shortness of breath  Fever of 100F or higher  Black, tarry-looking stools  For urgent or emergent issues, a gastroenterologist can be reached at any hour by calling [phone].   DIET:  We do recommend a small meal at first, but then you may proceed to your regular diet.  Drink plenty of fluids but you should avoid alcoholic beverages for 24 hours.  ACTIVITY:  You should plan to take it easy for the rest of today and you should NOT DRIVE or use heavy machinery until tomorrow (because of the sedation medicines  used during the test).    FOLLOW UP: Our staff will call the number listed on your records the next business day following your procedure to check on you and address any questions or concerns that you may have regarding the information given to you following your procedure. If we do not reach you, we will leave a message.  However, if you are feeling well and you are not experiencing any problems, there is no need to return our call.  We will assume that you have returned to your regular daily activities without incident.  If any biopsies were taken you will be contacted by phone or by letter within the next 1-3 weeks.  Please call us at [phone] if you have not heard about the biopsies in 3 weeks.    SIGNATURES/CONFIDENTIALITY: You and/or your care partner have signed paperwork which will be entered into your electronic medical record.  These signatures attest to the fact that that the information above on your After Visit Summary has been reviewed and is understood.  Full responsibility of the confidentiality of this discharge information lies with you and/or your care-partner.   Resume medications. Office will schedule CT SCAN and contact you. Contrast given.

## 2017-04-26 NOTE — Telephone Encounter (Signed)
Please schedule CT scan abdomen and pelvis with oral contrast only (due to elevated creatinine) for Dx: generalized abdominal pain and weight loss.  Patient should have been given bottles of oral contrast at her EGD appointment today.

## 2017-04-26 NOTE — Telephone Encounter (Signed)
Pt has been scheduled for 05-03-2017 @ 230 pm. Left a voicemail to return call. Instruction sheet mailed to pts home address on file.

## 2017-04-26 NOTE — Progress Notes (Signed)
Called to room to assist during endoscopic procedure.  Patient ID and intended procedure confirmed with present staff. Received instructions for my participation in the procedure from the performing physician.  

## 2017-04-26 NOTE — Op Note (Signed)
[name] Patient Name: [name] Procedure Date: 04/26/2017 7:44 AM MRN: [id number] Endoscopist: [place]. Loletha Carrow , MD Age: 83 Referring MD:  Date of Birth: February 24, 1934 Gender: Female Account #: [id number] Procedure:                Upper GI endoscopy Indications:              Epigastric abdominal pain, Weight loss Medicines:                Monitored Anesthesia Care Procedure:                Pre-Anesthesia Assessment:                           - Prior to the procedure, a History and Physical                            was performed, and patient medications and                            allergies were reviewed. The patient's tolerance of                            previous anesthesia was also reviewed. The risks                            and benefits of the procedure and the sedation                            options and risks were discussed with the patient.                            All questions were answered, and informed consent                            was obtained. Prior Anticoagulants: The patient has                            taken Plavix (clopidogrel), last dose was 5 days                            prior to procedure. ASA Grade Assessment: II - A                            patient with mild systemic disease. After reviewing                            the risks and benefits, the patient was deemed in                            satisfactory condition to undergo the procedure.                           After obtaining informed consent, the endoscope was  passed under direct vision. Throughout the                            procedure, the patient's blood pressure, pulse, and                            oxygen saturations were monitored continuously. The                            Endoscope was introduced through the mouth, and                            advanced to the second part of duodenum. The upper                            GI  endoscopy was accomplished without difficulty.                            The patient tolerated the procedure well. Scope In: Scope Out: Findings:                 The esophagus was normal.                           Patchy mild inflammation characterized by                            congestion (edema) and erythema was found in the                            entire examined stomach. Biopsies were taken with a                            cold forceps for histology ([facility] protocol).                           The cardia and gastric fundus were normal on                            retroflexion.                           The examined duodenum was normal. Complications:            No immediate complications. Estimated Blood Loss:     Estimated blood loss was minimal. Impression:               - Normal esophagus.                           - Gastritis. Biopsied.                           - Normal examined duodenum. Recommendation:           - Patient has a contact number available for  emergencies. The signs and symptoms of potential                            delayed complications were discussed with the                            patient. Return to normal activities tomorrow.                            Written discharge instructions were provided to the                            patient.                           - Resume previous diet.                           - Resume Plavix (clopidogrel) at prior dose today.                           - Await pathology results.                           - Perform CT scan (computed tomography) of the                            abdomen and pelvis with oral contrast only at                            appointment to be scheduled. Henry L. Loletha Carrow, MD 04/26/2017 7:58:50 AM This report has been signed electronically.

## 2017-04-27 ENCOUNTER — Other Ambulatory Visit: Payer: Self-pay | Admitting: Internal Medicine

## 2017-04-27 ENCOUNTER — Telehealth: Payer: Self-pay | Admitting: *Deleted

## 2017-04-27 NOTE — Telephone Encounter (Signed)
  Follow up Call-  Call back number 04/26/2017  Post procedure Call Back phone  # [phone] hm  Permission to leave phone message Yes  Some recent data might be hidden     Patient questions:  Do you have a fever, pain , or abdominal swelling? No. Pain Score  0 *  Have you tolerated food without any problems? Yes.    Have you been able to return to your normal activities? Yes.    Do you have any questions about your discharge instructions: Diet   No. Medications  No. Follow up visit  No.  Do you have questions or concerns about your Care? No.  Actions: * If pain score is 4 or above: No action needed, pain <4.

## 2017-04-27 NOTE — Telephone Encounter (Signed)
Left message on f/u call 

## 2017-05-03 ENCOUNTER — Ambulatory Visit (INDEPENDENT_AMBULATORY_CARE_PROVIDER_SITE_OTHER)
Admission: RE | Admit: 2017-05-03 | Discharge: 2017-05-03 | Disposition: A | Discharge status: Home / Self Care | Payer: Medicare Other | Source: Ambulatory Visit | Attending: Gastroenterology | Admitting: Gastroenterology

## 2017-05-03 DIAGNOSIS — R1084 Generalized abdominal pain: Secondary | ICD-10-CM

## 2017-05-03 DIAGNOSIS — N281 Cyst of kidney, acquired: Secondary | ICD-10-CM | POA: Diagnosis not present

## 2017-05-03 DIAGNOSIS — R634 Abnormal weight loss: Secondary | ICD-10-CM

## 2017-05-03 DIAGNOSIS — K5730 Diverticulosis of large intestine without perforation or abscess without bleeding: Secondary | ICD-10-CM | POA: Diagnosis not present

## 2017-05-10 ENCOUNTER — Other Ambulatory Visit: Payer: Self-pay | Admitting: Internal Medicine

## 2017-05-14 ENCOUNTER — Other Ambulatory Visit: Payer: Self-pay | Admitting: Internal Medicine

## 2017-05-16 ENCOUNTER — Other Ambulatory Visit: Payer: Self-pay | Admitting: Internal Medicine

## 2017-05-18 NOTE — Telephone Encounter (Signed)
Last filled 09/02/16, #24 x 1 rf by Dorothyann Peng, NP Last seen 03/12/17 Please advise Dr Regis Bill, thanks.

## 2017-05-21 ENCOUNTER — Other Ambulatory Visit: Payer: Self-pay | Admitting: Internal Medicine

## 2017-05-22 NOTE — Telephone Encounter (Signed)
Ok to refill x 1 no refills

## 2017-05-22 NOTE — Telephone Encounter (Signed)
Rx phoned in.  Nothing further needed.

## 2017-05-23 NOTE — Telephone Encounter (Signed)
Need to know what dose of Pantoprazole she is on currently. Dose was increased to BID last visit d/t abdominal pain. Pt has since been seen by GI but there is no note if the dose was dropped back down to Daily or kept as BID.   LM for patient to get clarification of dose.

## 2017-05-25 NOTE — Telephone Encounter (Signed)
Lm for patient for clarification of dose

## 2017-05-28 NOTE — Telephone Encounter (Signed)
Copied from [facility] #[id number]. Topic: Quick Communication - See Telephone Encounter >> May 28, 2017  9:21 AM Robina Ade, Leone Payor wrote: Earnest Bailey from [facility] called and need refill on patient Pantoprazole medication. Patient is taking 40 mg 2X a day.

## 2017-05-29 NOTE — Telephone Encounter (Signed)
Medication refilled. Nothing further needed  

## 2017-05-31 ENCOUNTER — Other Ambulatory Visit: Payer: Self-pay | Admitting: Internal Medicine

## 2017-06-01 ENCOUNTER — Telehealth: Payer: Self-pay | Admitting: Hematology

## 2017-06-01 NOTE — Telephone Encounter (Signed)
Lvm advising appt chgd from 2/4 to 2/28 @ 10.15.

## 2017-06-12 ENCOUNTER — Ambulatory Visit: Payer: Medicare Other | Admitting: Gastroenterology

## 2017-06-15 ENCOUNTER — Other Ambulatory Visit: Payer: Self-pay | Admitting: Internal Medicine

## 2017-06-16 ENCOUNTER — Other Ambulatory Visit: Payer: Self-pay | Admitting: Internal Medicine

## 2017-06-18 ENCOUNTER — Other Ambulatory Visit: Payer: Medicare Other

## 2017-06-18 ENCOUNTER — Ambulatory Visit: Payer: Medicare Other | Admitting: Hematology

## 2017-06-22 ENCOUNTER — Ambulatory Visit: Payer: Medicare Other | Admitting: Internal Medicine

## 2017-07-06 ENCOUNTER — Encounter: Payer: Self-pay | Admitting: Internal Medicine

## 2017-07-06 DIAGNOSIS — H5310 Unspecified subjective visual disturbances: Secondary | ICD-10-CM | POA: Diagnosis not present

## 2017-07-06 DIAGNOSIS — H3563 Retinal hemorrhage, bilateral: Secondary | ICD-10-CM | POA: Diagnosis not present

## 2017-07-06 DIAGNOSIS — H40023 Open angle with borderline findings, high risk, bilateral: Secondary | ICD-10-CM | POA: Diagnosis not present

## 2017-07-06 DIAGNOSIS — H35033 Hypertensive retinopathy, bilateral: Secondary | ICD-10-CM | POA: Diagnosis not present

## 2017-07-06 NOTE — Progress Notes (Signed)
Chief Complaint  Patient presents with  . Follow-up    No new complaints. Pt states that she has had good days and bad days. Low grade temp today -- little head congestion today. Pt is concerned of two small knots on her R wrist.     HPI: [name] 84 y.o. comes in today for   Chronic disease management Bp has been uips some  Eye dr  Seen last week told to come and check and notes would be sent to [place]. Has appt t w dr Hollie Salk this week. Feels tired all tine but no new sob Cp swelling  Ms  Right hand for the last month  Pain and now nodules  At wrist and base of thumb and hard to open jars sqeeze  ?  What can she take  No fall   Health Maintenance  Topic Date Due  . FOOT EXAM  01/16/1944  . OPHTHALMOLOGY EXAM  01/16/1944  . DEXA SCAN  01/16/2001  . HEMOGLOBIN A1C  09/10/2017  . TETANUS/TDAP  04/03/2024  . INFLUENZA VACCINE  Completed  . PNA vac Low Risk Adult  Completed    ROS:  No bleeding  GEN/ HEENT: No fever, significant weight changes sweats headaches vision problems hearing changes, CV/ PULM; No chest pain  New shortness of breath cough, syncope,edema  change in exercise tolerance. GI /GU: No adominal pain, vomiting, change in bowel habits. No blood in the stool. No significant GU symptoms. SKIN/HEME: ,no acute skin rashes suspicious lesions or bleeding. No lymphadenopathy, , masses.  NEURO/ PSYCH:  No neurologic signs such as weakness numbness. No  New depression anxiety. IMM/ Allergy: No unusual infections.  Allergy .   REST of 12 system review negative except as per HPI   Past Medical History:  Diagnosis Date  . Allergy   . Anemia   . Anxiety   . Arthritis    "shoulders" (09/09/2012)  . Asthma    " onset when young"   . Diabetes mellitus without complication ([place])    "Borderline" per pt; being monitored.  no meds per pt  . GERD (gastroesophageal reflux disease)   . Headache(784.0)    "related to my high blood pressure" (09/09/2012)  . History of DVT  (deep vein thrombosis)    "RLE" (09/09/2012) coumadine cant take asa so on plavix   . Hyperlipidemia   . Hypertension   . Lupus    "cured years ago" (09/09/2012)  . Varicose veins     Family History  Problem Relation Age of Onset  . Hypertension Mother   . Breast cancer Mother   . Hypertension Father   . Cancer Sister        Colon and Breast Cancer  . Breast cancer Sister   . Colon cancer Sister        ? 49' s dx - died in 29's  . Hypertension Brother   . Rectal cancer Brother   . Stomach cancer Brother   . Heart attack Daughter   . Esophageal cancer Daughter   . Lung cancer Unknown        both parents     Social History   Socioeconomic History  . Marital status: Single    Spouse name: None  . Number of children: 6  . Years of education: None  . Highest education level: None  Social Needs  . Financial resource strain: None  . Food insecurity - worry: None  . Food insecurity - inability: None  .  Transportation needs - medical: None  . Transportation needs - non-medical: None  Occupational History  . None  Tobacco Use  . Smoking status: Never Smoker  . Smokeless tobacco: Never Used  Substance and Sexual Activity  . Alcohol use: No  . Drug use: No  . Sexual activity: None  Other Topics Concern  . None  Social History Narrative   2 people living in the home.  Grand daughter.   Up and down through the night      Had 7 children  2 deceased . 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    works 3 days per week currently .   Glasses dentures  Neg tad     Outpatient Encounter Medications as of 07/09/2017  Medication Sig  . acetaminophen (TYLENOL) 500 MG tablet Take 1,000 mg by mouth every 6 (six) hours as needed for moderate pain. Reported on 04/30/2015  . ADVAIR DISKUS 250-50 MCG/DOSE AEPB USE 1 PUFF TWICE DAILY.  [name] albuterol (PROVENTIL) (2.5 MG/3ML) 0.083% nebulizer solution USE 1 AMPULE  EVERY 6 HOURS AS NEEDED FOR WHEEZE  . citalopram (CELEXA) 20 MG tablet Take 1 tablet (20 mg total) by mouth daily.  . clopidogrel (PLAVIX) 75 MG tablet TAKE 1 TABLET ONCE DAILY.  [name] diclofenac sodium (VOLTAREN) 1 % GEL APPLY 4GM TO AFFECTED AREA(S) 4 TIMES A DAY.  [name] doxazosin (CARDURA) 2 MG tablet TAKE 3 TABLETS AT BEDTIME.  . fenofibrate 160 MG tablet TAKE 1 TABLET ONCE DAILY.  . ferrous sulfate 325 (65 FE) MG EC tablet Take 1 tablet (325 mg total) by mouth daily with breakfast.  . furosemide (LASIX) 20 MG tablet Take 1 tablet (20 mg total) by mouth 2 (two) times daily.  [name] lidocaine (LIDODERM) 5 % Place 1 patch onto the skin daily. Remove & Discard patch within 12 hours or as directed by MD  . LORazepam (ATIVAN) 1 MG tablet TAKE 1 TABLET EVERY 8 HOURS AS NEEDED FOR ANXIETY.  . metoprolol succinate (TOPROL-XL) 50 MG 24 hr tablet TAKE 1 TABLET ONCE DAILY WITH FOOD.  [name] montelukast (SINGULAIR) 10 MG tablet TAKE 1 TABLET ONCE DAILY.  [name] Olmesartan-Amlodipine-HCTZ 40-10-25 MG TABS TAKE 1 TABLET ONCE DAILY.  . pantoprazole (PROTONIX) 40 MG tablet TAKE (1) TABLET TWICE A DAY BEFORE MEALS.  [name] potassium chloride (K-DUR) 10 MEQ tablet TAKE 4 TABLETS DAILY.  [name] PROAIR HFA 108 (90 Base) MCG/ACT inhaler USE 2 PUFFS EVERY 6 HOURS AS NEEDED FOR WHEEZING.  . RESTASIS 0.05 % ophthalmic emulsion   . Spacer/Aero-Holding Chambers (AEROCHAMBER PLUS) inhaler Use as instructed  . vitamin B-12 (CYANOCOBALAMIN) 1000 MCG tablet Take 1,000 mcg by mouth daily.  . [DISCONTINUED] traMADol (ULTRAM) 50 MG tablet Take one to two po q 6 hours prn pain (Patient not taking: Reported on 07/09/2017)   No facility-administered encounter medications on file as of 07/09/2017.     EXAM:  BP (!) 160/70 (BP Location: Right Arm, Patient Position: Sitting, Cuff Size: Large)   Pulse 63   Temp 99 F (37.2 C) (Oral)   Wt 169 lb 9.6 oz (76.9 kg)   BMI 33.12 kg/m   Body mass index is 33.12 kg/m.  Physical Exam: Vital signs  reviewed ONG:EXBM is a well-developed well-nourished alert cooperative   who appears stated age in no acute distress.  Able to get on table   Antalgic gait  HEENT: normocephalic atraumatic ,  NECK: supple without masses, thyromegaly or bruits. CHEST/PULM:  Clear to auscultation and percussion breath sounds equal no wheeze , rales or rhonchi. CV: PMI is nondisplaced, S1 S2 no gallops, murmurs, rubs. ABDOMEN: Bowel sounds normal  Mild tender ruq no rebound no masses  no hepato splenomegal no CVA tenderness.   Extremtities:  No clubbing cyanosis o copmpression  Stockings  Right hand with  Swelling at  Wrist with painfulnodules  Pinpoint x 2 and thumb  NEURO:  Oriented x3, cranial nerves 3-12 appear to be intact, no obvious focal weakness,gait within nantalgic but independent  SKIN: No acute rashes normal turgor, color, no bruising or petechiae. PSYCH: Oriented, good eye contact, no obvious depression anxiety, cognition and judgment appear normal. LN: no cervical aadenopathy   Lab Results  Component Value Date   WBC 3.9 (L) 07/09/2017   HGB 9.7 (L) 07/09/2017   HCT 28.4 (L) 07/09/2017   PLT 209.0 07/09/2017   GLUCOSE 94 07/09/2017   CHOL 170 07/09/2017   TRIG 118.0 07/09/2017   HDL 57.40 07/09/2017   LDLCALC 89 07/09/2017   ALT 12 07/09/2017   AST 25 07/09/2017   NA 139 07/09/2017   K 4.0 07/09/2017   CL 101 07/09/2017   CREATININE 1.85 (H) 07/09/2017   BUN 36 (H) 07/09/2017   CO2 30 07/09/2017   TSH 1.31 07/09/2017   INR 0.94 12/22/2009   HGBA1C 5.9 07/09/2017   MICROALBUR 10.7 (H) 04/25/2016    ASSESSMENT AND PLAN:  Discussed the following assessment and plan:  Essential hypertension - Plan: CBC with Differential/Platelet, Basic metabolic panel, Hemoglobin A1c, Hepatic function panel, Lipid panel, TSH, T4, free, Uric Acid  Medication management - Plan: CBC with Differential/Platelet, Basic metabolic panel, Hemoglobin A1c, Hepatic function panel, Lipid panel, TSH, T4, free,  Uric Acid  Renal insufficiency creatinine 1.3 - Plan: CBC with Differential/Platelet, Basic metabolic panel, Hemoglobin A1c, Hepatic function panel, Lipid panel, TSH, T4, free, Uric Acid, VITAMIN D 25 Hydroxy (Vit-D Deficiency, Fractures)  Anemia of chronic disease - Plan: CBC with Differential/Platelet, Basic metabolic panel, Hemoglobin A1c, Hepatic function panel, Lipid panel, TSH, T4, free, Uric Acid  CKD (chronic kidney disease) stage 3, GFR 30-59 ml/min (HCC) - Plan: CBC with Differential/Platelet, Basic metabolic panel, Hemoglobin A1c, Hepatic function panel, Lipid panel, TSH, T4, free, Uric Acid, VITAMIN D 25 Hydroxy (Vit-D Deficiency, Fractures)  Fasting hyperglycemia - Plan: CBC with Differential/Platelet, Basic metabolic panel, Hemoglobin A1c, Hepatic function panel, Lipid panel, TSH, T4, free, Uric Acid  Asthma, chronic, unspecified asthma severity, with acute exacerbation  Swelling of hand joint, right - Plan: CBC with Differential/Platelet, Basic metabolic panel, Hemoglobin A1c, Hepatic function panel, Lipid panel, TSH, T4, free, Uric Acid, Ambulatory referral to Hand Surgery, Cyclic citrul peptide antibody, IgG  Other fatigue  Vitamin D deficiency - Plan: VITAMIN D 25 Hydroxy (Vit-D Deficiency, Fractures) Basically her hypertension is now back out of control she has right hand dysfunction new possibly arthritic overuse but uncertain cause of the nodules.  We will get a uric acid.   We are Avoiding anti-inflammatories NSAIDs because of her renal disease. She states she feels tired but could have many reasons including a number of medicine she is on.  I do not see any acute findings on her exam today except as above. We will be waiting for the note not available to me today about concerns from the ophthalmologist to be seen by the PCP. May need help from nephrology for better med management of her hypertension.  Could also consist her can consider seeing in the hypertension  clinic. She was placed on a number of these meds by cardiology such as the Minipress.  Uncertain how effective it is and if it is giving her side effects. We will do lab monitoring today and then make a plan for follow-up and medication changes. Total visit 40   Review and arrangement of plan smins > 50% spent counseling and coordinating care as indicated in above note and in instructions to patient .  Will look for the  Eye notes when they arrive     Patient Care Team: Venus Gilles, Standley Brooking, MD as PCP - General (Internal Medicine) Marlou Sa, Tonna Corner, MD (Orthopedic Surgery) Truitt Merle, MD as Consulting Physician (Hematology) Tanda Rockers, MD as Consulting Physician (Pulmonary Disease) Madelon Lips, MD as Consulting Physician (Nephrology) Monna Fam, MD as Consulting Physician (Ophthalmology)  Patient Instructions   I want  You to see  The  orthopedist or hand ortho  About the right hand nodules and pain.  Dfr Kuzma   Some of your meds can make your tired   But so can anemia  And other  Problems.  I dont have the eye doctors   Notes to read yet but will look ointo that and hae someone communicate with you  About  A plan .   Your BP is hight today  We may need  Dr Hollie Salk to help [place] with  A different plan for blood pressure control .   Plan fU visit after blood work back   And  Record review .  And plan for  Medication      Standley Brooking. Olanda Boughner M.D.

## 2017-07-09 ENCOUNTER — Encounter: Payer: Self-pay | Admitting: Internal Medicine

## 2017-07-09 ENCOUNTER — Ambulatory Visit (INDEPENDENT_AMBULATORY_CARE_PROVIDER_SITE_OTHER): Payer: Medicare Other | Admitting: Internal Medicine

## 2017-07-09 VITALS — BP 160/70 | HR 63 | Temp 99.0°F | Wt 169.6 lb

## 2017-07-09 DIAGNOSIS — R5383 Other fatigue: Secondary | ICD-10-CM

## 2017-07-09 DIAGNOSIS — M25441 Effusion, right hand: Secondary | ICD-10-CM | POA: Diagnosis not present

## 2017-07-09 DIAGNOSIS — N289 Disorder of kidney and ureter, unspecified: Secondary | ICD-10-CM

## 2017-07-09 DIAGNOSIS — N183 Chronic kidney disease, stage 3 unspecified: Secondary | ICD-10-CM

## 2017-07-09 DIAGNOSIS — Z79899 Other long term (current) drug therapy: Secondary | ICD-10-CM | POA: Diagnosis not present

## 2017-07-09 DIAGNOSIS — E559 Vitamin D deficiency, unspecified: Secondary | ICD-10-CM

## 2017-07-09 DIAGNOSIS — D638 Anemia in other chronic diseases classified elsewhere: Secondary | ICD-10-CM | POA: Diagnosis not present

## 2017-07-09 DIAGNOSIS — I10 Essential (primary) hypertension: Secondary | ICD-10-CM | POA: Diagnosis not present

## 2017-07-09 DIAGNOSIS — R7301 Impaired fasting glucose: Secondary | ICD-10-CM | POA: Diagnosis not present

## 2017-07-09 DIAGNOSIS — J45901 Unspecified asthma with (acute) exacerbation: Secondary | ICD-10-CM | POA: Diagnosis not present

## 2017-07-09 NOTE — Patient Instructions (Addendum)
  I want  You to see  The  orthopedist or hand ortho  About the right hand nodules and pain.  Dfr Kuzma   Some of your meds can make your tired   But so can anemia  And other  Problems.  I dont have the eye doctors   Notes to read yet but will look ointo that and hae someone communicate with you  About  A plan .   Your BP is hight today  We may need  Dr Hollie Salk to help [place] with  A different plan for blood pressure control .   Plan fU visit after blood work back   And  Record review .  And plan for  Medication

## 2017-07-10 ENCOUNTER — Telehealth: Payer: Self-pay

## 2017-07-10 ENCOUNTER — Other Ambulatory Visit: Payer: Self-pay | Admitting: Internal Medicine

## 2017-07-10 LAB — CBC WITH DIFFERENTIAL/PLATELET
BASOS PCT: 1.6 % (ref 0.0–3.0)
Basophils Absolute: 0.1 10*3/uL (ref 0.0–0.1)
EOS ABS: 0.1 10*3/uL (ref 0.0–0.7)
Eosinophils Relative: 1.4 % (ref 0.0–5.0)
HCT: 28.4 % — ABNORMAL LOW (ref 36.0–46.0)
Hemoglobin: 9.7 g/dL — ABNORMAL LOW (ref 12.0–15.0)
LYMPHS ABS: 1.3 10*3/uL (ref 0.7–4.0)
Lymphocytes Relative: 32.5 % (ref 12.0–46.0)
MCHC: 34.2 g/dL (ref 30.0–36.0)
MCV: 85.6 fl (ref 78.0–100.0)
MONO ABS: 0.4 10*3/uL (ref 0.1–1.0)
Monocytes Relative: 9.1 % (ref 3.0–12.0)
NEUTROS ABS: 2.2 10*3/uL (ref 1.4–7.7)
Neutrophils Relative %: 55.4 % (ref 43.0–77.0)
Platelets: 209 10*3/uL (ref 150.0–400.0)
RBC: 3.32 Mil/uL — ABNORMAL LOW (ref 3.87–5.11)
RDW: 13.5 % (ref 11.5–15.5)
WBC: 3.9 10*3/uL — ABNORMAL LOW (ref 4.0–10.5)

## 2017-07-10 LAB — BASIC METABOLIC PANEL
BUN: 36 mg/dL — ABNORMAL HIGH (ref 6–23)
CO2: 30 mEq/L (ref 19–32)
Calcium: 9.6 mg/dL (ref 8.4–10.5)
Chloride: 101 mEq/L (ref 96–112)
Creatinine, Ser: 1.85 mg/dL — ABNORMAL HIGH (ref 0.40–1.20)
GFR: 33.61 mL/min — AB (ref >60.00)
Glucose, Bld: 94 mg/dL (ref 70–99)
POTASSIUM: 4 meq/L (ref 3.5–5.1)
SODIUM: 139 meq/L (ref 135–145)

## 2017-07-10 LAB — HEMOGLOBIN A1C: HEMOGLOBIN A1C: 5.9 % (ref 4.6–6.5)

## 2017-07-10 LAB — LIPID PANEL
Cholesterol: 170 mg/dL (ref 0–200)
HDL: 57.4 mg/dL (ref >39.00)
LDL Cholesterol: 89 mg/dL (ref 0–99)
NONHDL: 112.39
TRIGLYCERIDES: 118 mg/dL (ref 0.0–149.0)
Total CHOL/HDL Ratio: 3
VLDL: 23.6 mg/dL (ref 0.0–40.0)

## 2017-07-10 LAB — T4, FREE: Free T4: 0.74 ng/dL (ref 0.60–1.60)

## 2017-07-10 LAB — URIC ACID: Uric Acid, Serum: 7.6 mg/dL — ABNORMAL HIGH (ref 2.4–7.0)

## 2017-07-10 LAB — CYCLIC CITRUL PEPTIDE ANTIBODY, IGG: Cyclic Citrullin Peptide Ab: <16 UNITS

## 2017-07-10 LAB — HEPATIC FUNCTION PANEL
ALBUMIN: 3.7 g/dL (ref 3.5–5.2)
ALK PHOS: 35 U/L — AB (ref 39–117)
ALT: 12 U/L (ref 0–35)
AST: 25 U/L (ref 0–37)
Bilirubin, Direct: 0.1 mg/dL (ref 0.0–0.3)
Total Bilirubin: 0.3 mg/dL (ref 0.2–1.2)
Total Protein: 6.2 g/dL (ref 6.0–8.3)

## 2017-07-10 LAB — TSH: TSH: 1.31 u[IU]/mL (ref 0.35–4.50)

## 2017-07-10 LAB — VITAMIN D 25 HYDROXY (VIT D DEFICIENCY, FRACTURES): VITD: 32.77 ng/mL (ref 30.00–100.00)

## 2017-07-10 NOTE — Telephone Encounter (Signed)
Called patient to inform them of their appointment day. Patient called and asked to cancel for tomorrow, but her appointment is not until Thursday 28th. Left a detailed message of this. And stated int the message to call back if she still wish to cancel this appointment.per 2/26 in basket

## 2017-07-11 ENCOUNTER — Other Ambulatory Visit: Payer: Self-pay | Admitting: Internal Medicine

## 2017-07-11 DIAGNOSIS — M329 Systemic lupus erythematosus, unspecified: Secondary | ICD-10-CM | POA: Diagnosis not present

## 2017-07-11 DIAGNOSIS — N183 Chronic kidney disease, stage 3 (moderate): Secondary | ICD-10-CM | POA: Diagnosis not present

## 2017-07-11 DIAGNOSIS — I129 Hypertensive chronic kidney disease with stage 1 through stage 4 chronic kidney disease, or unspecified chronic kidney disease: Secondary | ICD-10-CM | POA: Diagnosis not present

## 2017-07-12 ENCOUNTER — Encounter: Payer: Medicare Other | Admitting: Hematology

## 2017-07-12 ENCOUNTER — Other Ambulatory Visit: Payer: Medicare Other

## 2017-07-12 ENCOUNTER — Other Ambulatory Visit: Payer: Self-pay | Admitting: Internal Medicine

## 2017-07-12 NOTE — Progress Notes (Signed)
No show  This encounter was created in error - please disregard.

## 2017-07-13 ENCOUNTER — Telehealth: Payer: Self-pay | Admitting: Hematology

## 2017-07-13 NOTE — Telephone Encounter (Signed)
Left message for patient to call and reschedule her missed appointment from 2/28 per sch msg 2/28

## 2017-07-20 DIAGNOSIS — M1811 Unilateral primary osteoarthritis of first carpometacarpal joint, right hand: Secondary | ICD-10-CM | POA: Diagnosis not present

## 2017-07-20 DIAGNOSIS — M79641 Pain in right hand: Secondary | ICD-10-CM | POA: Diagnosis not present

## 2017-07-20 DIAGNOSIS — M65332 Trigger finger, left middle finger: Secondary | ICD-10-CM | POA: Diagnosis not present

## 2017-07-20 DIAGNOSIS — M79642 Pain in left hand: Secondary | ICD-10-CM | POA: Diagnosis not present

## 2017-07-23 ENCOUNTER — Ambulatory Visit: Payer: Medicare Other | Admitting: Gastroenterology

## 2017-07-25 ENCOUNTER — Other Ambulatory Visit: Payer: Self-pay | Admitting: Internal Medicine

## 2017-07-25 ENCOUNTER — Telehealth: Payer: Self-pay | Admitting: *Deleted

## 2017-07-25 NOTE — Telephone Encounter (Signed)
DICLOFENAC GEL 1% is approved through 05/14/2018.

## 2017-07-25 NOTE — Telephone Encounter (Signed)
PA for diclofenac 1% gel initiated via CoverMyMeds, key [id number]. Awaiting determination.

## 2017-07-27 ENCOUNTER — Other Ambulatory Visit: Payer: Self-pay | Admitting: Internal Medicine

## 2017-07-27 DIAGNOSIS — M79644 Pain in right finger(s): Secondary | ICD-10-CM | POA: Diagnosis not present

## 2017-07-27 DIAGNOSIS — N183 Chronic kidney disease, stage 3 (moderate): Secondary | ICD-10-CM | POA: Diagnosis not present

## 2017-07-31 DIAGNOSIS — R0989 Other specified symptoms and signs involving the circulatory and respiratory systems: Secondary | ICD-10-CM | POA: Diagnosis not present

## 2017-07-31 DIAGNOSIS — I129 Hypertensive chronic kidney disease with stage 1 through stage 4 chronic kidney disease, or unspecified chronic kidney disease: Secondary | ICD-10-CM | POA: Diagnosis not present

## 2017-07-31 DIAGNOSIS — N183 Chronic kidney disease, stage 3 (moderate): Secondary | ICD-10-CM | POA: Diagnosis not present

## 2017-08-03 DIAGNOSIS — H04123 Dry eye syndrome of bilateral lacrimal glands: Secondary | ICD-10-CM | POA: Diagnosis not present

## 2017-08-03 DIAGNOSIS — H5310 Unspecified subjective visual disturbances: Secondary | ICD-10-CM | POA: Diagnosis not present

## 2017-08-03 DIAGNOSIS — H40023 Open angle with borderline findings, high risk, bilateral: Secondary | ICD-10-CM | POA: Diagnosis not present

## 2017-08-07 NOTE — Progress Notes (Signed)
Chief Complaint  Patient presents with  . Leg Swelling  . Medication Management  . Hypertension    HPI: [name] 84 y.o. come in for   Fu   Many issues   Most recently  Has seen dr Dorna Mai  In feb and  Noted to have edema and poss fluid overload and adjusted  bp meds   Adding lasizx 80 qd and stopping hctc and changes  metoprolol to carvedilol  But  Left leg still smore swollen than right and some pain jumping at times .  alos  Sound in neck to get checked   BP  To go back in June .   resp   yestserdaybreathing not as good  Feels like cold and  rx not as helpful but no fever .   Noted to have  Ht retinopathy recently   anxiety pill not working that well .  ROS: See pertinent positives and negatives per HPI. No bleeding falling   New cough fever   Past Medical History:  Diagnosis Date  . Allergy   . Anemia   . Anxiety   . Arthritis    "shoulders" (09/09/2012)  . Asthma    " onset when young"   . Diabetes mellitus without complication ([place])    "Borderline" per pt; being monitored.  no meds per pt  . GERD (gastroesophageal reflux disease)   . Headache(784.0)    "related to my high blood pressure" (09/09/2012)  . History of DVT (deep vein thrombosis)    "RLE" (09/09/2012) coumadine cant take asa so on plavix   . Hyperlipidemia   . Hypertension   . Lupus    "cured years ago" (09/09/2012)  . Varicose veins     Family History  Problem Relation Age of Onset  . Hypertension Mother   . Breast cancer Mother   . Hypertension Father   . Cancer Sister        Colon and Breast Cancer  . Breast cancer Sister   . Colon cancer Sister        ? 1' s dx - died in 31's  . Hypertension Brother   . Rectal cancer Brother   . Stomach cancer Brother   . Heart attack Daughter   . Esophageal cancer Daughter   . Lung cancer Unknown        both parents     Social History   Socioeconomic History  . Marital status: Single    Spouse name: Not on file  . Number of children:  6  . Years of education: Not on file  . Highest education level: Not on file  Occupational History  . Not on file  Social Needs  . Financial resource strain: Not on file  . Food insecurity:    Worry: Not on file    Inability: Not on file  . Transportation needs:    Medical: Not on file    Non-medical: Not on file  Tobacco Use  . Smoking status: Never Smoker  . Smokeless tobacco: Never Used  Substance and Sexual Activity  . Alcohol use: No  . Drug use: No  . Sexual activity: Not on file  Lifestyle  . Physical activity:    Days per week: Not on file    Minutes per session: Not on file  . Stress: Not on file  Relationships  . Social connections:    Talks on phone: Not on file    Gets together: Not on file  Attends religious service: Not on file    Active member of club or organization: Not on file    Attends meetings of clubs or organizations: Not on file    Relationship status: Not on file  Other Topics Concern  . Not on file  Social History Narrative   2 people living in the home.  Grand daughter.   Up and down through the night      Had 7 children  2 deceased . 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    works 3 days per week currently .   Glasses dentures  Neg tad     Outpatient Medications Prior to Visit  Medication Sig Dispense Refill  . acetaminophen (TYLENOL) 500 MG tablet Take 1,000 mg by mouth every 6 (six) hours as needed for moderate pain. Reported on 04/30/2015    . ADVAIR DISKUS 250-50 MCG/DOSE AEPB USE 1 PUFF TWICE DAILY. 60 each 5  . albuterol (PROVENTIL) (2.5 MG/3ML) 0.083% nebulizer solution USE 1 AMPULE EVERY 6 HOURS AS NEEDED FOR WHEEZE 75 mL 0  . citalopram (CELEXA) 20 MG tablet Take 1 tablet (20 mg total) by mouth daily. 30 tablet 5  . clopidogrel (PLAVIX) 75 MG tablet TAKE 1 TABLET ONCE DAILY. 30 tablet 3  . diclofenac sodium (VOLTAREN) 1 % GEL APPLY 4GM TO AFFECTED  AREA(S) 4 TIMES A DAY. 100 g 0  . doxazosin (CARDURA) 2 MG tablet TAKE 3 TABLETS AT BEDTIME. 90 tablet 0  . fenofibrate 160 MG tablet TAKE 1 TABLET ONCE DAILY. 90 tablet 0  . ferrous sulfate 325 (65 FE) MG EC tablet Take 1 tablet (325 mg total) by mouth daily with breakfast. 30 tablet 3  . furosemide (LASIX) 20 MG tablet Take 1 tablet (20 mg total) by mouth 2 (two) times daily. (Patient taking differently: Take 80 mg by mouth daily. ) 60 tablet 2  . lidocaine (LIDODERM) 5 % Place 1 patch onto the skin daily. Remove & Discard patch within 12 hours or as directed by MD 30 patch 0  . LORazepam (ATIVAN) 1 MG tablet TAKE 1 TABLET EVERY 8 HOURS AS NEEDED FOR ANXIETY. 24 tablet 0  . montelukast (SINGULAIR) 10 MG tablet TAKE 1 TABLET ONCE DAILY. 90 tablet 0  . Olmesartan-Amlodipine-HCTZ 40-10-25 MG TABS TAKE 1 TABLET ONCE DAILY. 30 tablet 5  . pantoprazole (PROTONIX) 40 MG tablet TAKE (1) TABLET TWICE A DAY BEFORE MEALS. 60 tablet 2  . potassium chloride (K-DUR) 10 MEQ tablet TAKE 4 TABLETS DAILY. 120 tablet 2  . PROAIR HFA 108 (90 Base) MCG/ACT inhaler USE 2 PUFFS EVERY 6 HOURS AS NEEDED FOR WHEEZING. 8.5 g 0  . RESTASIS 0.05 % ophthalmic emulsion     . Spacer/Aero-Holding Chambers (AEROCHAMBER PLUS) inhaler Use as instructed 1 each 2  . vitamin B-12 (CYANOCOBALAMIN) 1000 MCG tablet Take 1,000 mcg by mouth daily.    . metoprolol succinate (TOPROL-XL) 50 MG 24 hr tablet TAKE 1 TABLET ONCE DAILY WITH FOOD. (Patient not taking: Reported on 08/08/2017) 30 tablet 2   No facility-administered medications prior to visit.      EXAM:  BP (!) 148/60 (BP Location: Left Arm, Patient Position: Sitting, Cuff Size: Normal)   Pulse (!) 54   Temp 97.8 F (36.6 C) (Oral)   Wt 175 lb (79.4 kg)   SpO2 97%   BMI 34.18 kg/m   Body mass index is  34.18 kg/m.  GENERAL: vitals reviewed and listed above, alert, oriented, appears well hydrated and in no acute distress HEENT: atraumatic, conjunctiva  clear, no  obvious abnormalities on inspection of external nose and ears mild hoarseness  Not breathless   NECK: no obvious masses on inspection palpation   Short faint bruit  ? Radiated ?    LUNGS: clear to auscultation bilaterally, no wheezes, rales or rhonchi, but dec air movement  bilaterally CV: HRRR, no clubbing cyanosis r leg 2+  Left leg 3+   peripheral edema nl cap refill  MS: moves all extremities left leg compression with large edema  To mid calf mild tenderness   But no cords redness or focal tendereness PSYCH: pleasant and cooperative, no obvious depression or anxiety Lab Results  Component Value Date   WBC 3.7 (L) 08/08/2017   HGB 9.3 (L) 08/08/2017   HCT 27.6 (L) 08/08/2017   PLT 169.0 08/08/2017   GLUCOSE 107 (H) 08/08/2017   CHOL 170 07/09/2017   TRIG 118.0 07/09/2017   HDL 57.40 07/09/2017   LDLCALC 89 07/09/2017   ALT 12 07/09/2017   AST 25 07/09/2017   NA 133 (L) 08/08/2017   K 4.0 08/08/2017   CL 96 08/08/2017   CREATININE 1.86 (H) 08/08/2017   BUN 36 (H) 08/08/2017   CO2 30 08/08/2017   TSH 1.31 07/09/2017   INR 0.94 12/22/2009   HGBA1C 5.9 07/09/2017   MICROALBUR 10.7 (H) 04/25/2016   BP Readings from Last 3 Encounters:  08/08/17 (!) 148/60  07/09/17 (!) 160/70  04/26/17 (!) 140/54   Wt Readings from Last 3 Encounters:  08/08/17 175 lb (79.4 kg)  07/09/17 169 lb 9.6 oz (76.9 kg)  04/26/17 171 lb (77.6 kg)    ASSESSMENT AND PLAN:  Discussed the following assessment and plan:  Pain and swelling of left lower leg - Plan: DOPPLER VENOUS LEGS BILATERAL, Basic metabolic panel, CBC with Differential/Platelet, Brain natriuretic peptide, VAS [place] LOWER EXTREMITY VENOUS (DVT)  Essential hypertension - Plan: Basic metabolic panel, CBC with Differential/Platelet, Brain natriuretic peptide  Renal insufficiency creatinine 1.3 - Plan: Basic metabolic panel, CBC with Differential/Platelet, Brain natriuretic peptide  Edema, unspecified type - Plan: DOPPLER VENOUS LEGS  BILATERAL, DG Chest 2 View, Basic metabolic panel, CBC with Differential/Platelet, Brain natriuretic peptide  Dyspnea, unspecified type - Plan: DG Chest 2 View, Basic metabolic panel, CBC with Differential/Platelet, Brain natriuretic peptide  History of DVT of lower extremity - Plan: DOPPLER VENOUS LEGS BILATERAL, VAS [place] LOWER EXTREMITY VENOUS (DVT)  Hypertensive retinopathy, unspecified laterality  Chronic kidney disease, stage III (moderate) (HCC)  Bilateral carotid bruits - Plan: VAS US CAROTID R/o dvt  Edema could be cw fluid retention   Hx of dvt   prob cause of  asymmetry anot obstruction  By hx.  Checking c xray   Also if appropriate plan  Inc diuretic for a week to see if  Edema further subsides.    Can get non urgent doppler or carotids but dont think any relations to her current sx issues.  Plan fu after acute problems addressed for the other issues .   Concern about fluid   Causing sx .    -Patient advised to return or notify health care team  if  new concerns arise.  Patient Instructions  Getting   Leg   Doppler to make sure no clot in the left leg  And chest x ray .    May need  To take  Lasix twice  a day for  A week if we need to get fluid off.   Also will plan.    Doppler artery .    Of the carotid in the neck  To check for  Clogging of the artery  s  But  mayu not be important at this time  Someone will contact you about that test in future.    Plan FU visit and med evaluation in a  month or earlier  As needed .     Standley Brooking. Dulcie Gammon M.D.

## 2017-08-08 ENCOUNTER — Encounter: Payer: Self-pay | Admitting: Internal Medicine

## 2017-08-08 ENCOUNTER — Ambulatory Visit (INDEPENDENT_AMBULATORY_CARE_PROVIDER_SITE_OTHER): Payer: Medicare Other | Admitting: Internal Medicine

## 2017-08-08 ENCOUNTER — Ambulatory Visit (HOSPITAL_COMMUNITY)
Admission: RE | Admit: 2017-08-08 | Discharge: 2017-08-08 | Disposition: A | Discharge status: Home / Self Care | Payer: Medicare Other | Source: Ambulatory Visit | Attending: Internal Medicine | Admitting: Internal Medicine

## 2017-08-08 ENCOUNTER — Ambulatory Visit (INDEPENDENT_AMBULATORY_CARE_PROVIDER_SITE_OTHER)
Admission: RE | Admit: 2017-08-08 | Discharge: 2017-08-08 | Disposition: A | Discharge status: Home / Self Care | Payer: Medicare Other | Source: Ambulatory Visit | Attending: Internal Medicine | Admitting: Internal Medicine

## 2017-08-08 VITALS — BP 148/60 | HR 54 | Temp 97.8°F | Wt 175.0 lb

## 2017-08-08 DIAGNOSIS — H35039 Hypertensive retinopathy, unspecified eye: Secondary | ICD-10-CM

## 2017-08-08 DIAGNOSIS — M79662 Pain in left lower leg: Secondary | ICD-10-CM | POA: Diagnosis not present

## 2017-08-08 DIAGNOSIS — M7989 Other specified soft tissue disorders: Secondary | ICD-10-CM | POA: Diagnosis not present

## 2017-08-08 DIAGNOSIS — Z86718 Personal history of other venous thrombosis and embolism: Secondary | ICD-10-CM | POA: Diagnosis not present

## 2017-08-08 DIAGNOSIS — I10 Essential (primary) hypertension: Secondary | ICD-10-CM | POA: Diagnosis not present

## 2017-08-08 DIAGNOSIS — N183 Chronic kidney disease, stage 3 unspecified: Secondary | ICD-10-CM

## 2017-08-08 DIAGNOSIS — N289 Disorder of kidney and ureter, unspecified: Secondary | ICD-10-CM

## 2017-08-08 DIAGNOSIS — R0989 Other specified symptoms and signs involving the circulatory and respiratory systems: Secondary | ICD-10-CM

## 2017-08-08 DIAGNOSIS — R0600 Dyspnea, unspecified: Secondary | ICD-10-CM

## 2017-08-08 DIAGNOSIS — R609 Edema, unspecified: Secondary | ICD-10-CM

## 2017-08-08 LAB — BASIC METABOLIC PANEL
BUN: 36 mg/dL — ABNORMAL HIGH (ref 6–23)
CHLORIDE: 96 meq/L (ref 96–112)
CO2: 30 meq/L (ref 19–32)
Calcium: 9 mg/dL (ref 8.4–10.5)
Creatinine, Ser: 1.86 mg/dL — ABNORMAL HIGH (ref 0.40–1.20)
GFR: 33.39 mL/min — ABNORMAL LOW (ref >60.00)
Glucose, Bld: 107 mg/dL — ABNORMAL HIGH (ref 70–99)
POTASSIUM: 4 meq/L (ref 3.5–5.1)
SODIUM: 133 meq/L — AB (ref 135–145)

## 2017-08-08 LAB — CBC WITH DIFFERENTIAL/PLATELET
BASOS PCT: 0.7 % (ref 0.0–3.0)
Basophils Absolute: 0 10*3/uL (ref 0.0–0.1)
Eosinophils Absolute: 0.1 10*3/uL (ref 0.0–0.7)
Eosinophils Relative: 3.2 % (ref 0.0–5.0)
HEMATOCRIT: 27.6 % — AB (ref 36.0–46.0)
HEMOGLOBIN: 9.3 g/dL — AB (ref 12.0–15.0)
LYMPHS PCT: 35.5 % (ref 12.0–46.0)
Lymphs Abs: 1.3 10*3/uL (ref 0.7–4.0)
MCHC: 33.8 g/dL (ref 30.0–36.0)
MCV: 86.5 fl (ref 78.0–100.0)
MONOS PCT: 6.9 % (ref 3.0–12.0)
Monocytes Absolute: 0.3 10*3/uL (ref 0.1–1.0)
NEUTROS ABS: 2 10*3/uL (ref 1.4–7.7)
Neutrophils Relative %: 53.7 % (ref 43.0–77.0)
PLATELETS: 169 10*3/uL (ref 150.0–400.0)
RBC: 3.19 Mil/uL — ABNORMAL LOW (ref 3.87–5.11)
RDW: 13.5 % (ref 11.5–15.5)
WBC: 3.7 10*3/uL — ABNORMAL LOW (ref 4.0–10.5)

## 2017-08-08 LAB — BRAIN NATRIURETIC PEPTIDE: Pro B Natriuretic peptide (BNP): 197 pg/mL — ABNORMAL HIGH (ref 0.0–100.0)

## 2017-08-08 NOTE — Patient Instructions (Addendum)
Getting   Leg   Doppler to make sure no clot in the left leg  And chest x ray .    May need  To take  Lasix twice a day for  A week if we need to get fluid off.   Also will plan.    Doppler artery .    Of the carotid in the neck  To check for  Clogging of the artery  s  But  mayu not be important at this time  Someone will contact you about that test in future.    Plan FU visit and med evaluation in a  month or earlier  As needed .

## 2017-08-08 NOTE — Progress Notes (Signed)
LLE venous duplex prelim: negative for DVT. Landry Mellow, RDMS, RVT Called results to [facility]

## 2017-08-09 ENCOUNTER — Telehealth: Payer: Self-pay | Admitting: Internal Medicine

## 2017-08-09 NOTE — Telephone Encounter (Signed)
Copied from [place] [id number]. Topic: Inquiry >> Aug 09, 2017  1:42 PM Margot Ables wrote: Reason for CRM: pt daughter said that someone called last night with lab results. Please call her back. The primary contact number is [phone]. Can call either of the other 2 numbers if she doesn't answer (the other numbers are her daughter and granddaughter)

## 2017-08-10 ENCOUNTER — Other Ambulatory Visit: Payer: Self-pay | Admitting: Internal Medicine

## 2017-08-10 NOTE — Telephone Encounter (Signed)
Spoke with patient, she states that her daughter was just returning my call but got the PEC the second time she called. Results were given and patient was scheduled for OV with Dr Regis Bill.  Pt requested that her appt be rescheduled to a morning appt so that there are no issues with transportation.  appt scheduled for 08/24/17 at 10:30a Nothing further needed.

## 2017-08-20 DIAGNOSIS — M65332 Trigger finger, left middle finger: Secondary | ICD-10-CM | POA: Diagnosis not present

## 2017-08-20 DIAGNOSIS — M1811 Unilateral primary osteoarthritis of first carpometacarpal joint, right hand: Secondary | ICD-10-CM | POA: Diagnosis not present

## 2017-08-22 ENCOUNTER — Other Ambulatory Visit: Payer: Self-pay | Admitting: Internal Medicine

## 2017-08-24 ENCOUNTER — Encounter: Payer: Self-pay | Admitting: Internal Medicine

## 2017-08-24 ENCOUNTER — Ambulatory Visit (INDEPENDENT_AMBULATORY_CARE_PROVIDER_SITE_OTHER): Payer: Medicare Other | Admitting: Internal Medicine

## 2017-08-24 VITALS — BP 132/60 | HR 53 | Temp 98.3°F | Wt 169.8 lb

## 2017-08-24 DIAGNOSIS — M79642 Pain in left hand: Secondary | ICD-10-CM | POA: Diagnosis not present

## 2017-08-24 DIAGNOSIS — R609 Edema, unspecified: Secondary | ICD-10-CM | POA: Diagnosis not present

## 2017-08-24 DIAGNOSIS — I10 Essential (primary) hypertension: Secondary | ICD-10-CM

## 2017-08-24 DIAGNOSIS — N289 Disorder of kidney and ureter, unspecified: Secondary | ICD-10-CM | POA: Diagnosis not present

## 2017-08-24 DIAGNOSIS — D638 Anemia in other chronic diseases classified elsewhere: Secondary | ICD-10-CM

## 2017-08-24 DIAGNOSIS — M79641 Pain in right hand: Secondary | ICD-10-CM | POA: Diagnosis not present

## 2017-08-24 DIAGNOSIS — Z79899 Other long term (current) drug therapy: Secondary | ICD-10-CM

## 2017-08-24 DIAGNOSIS — I1310 Hypertensive heart and chronic kidney disease without heart failure, with stage 1 through stage 4 chronic kidney disease, or unspecified chronic kidney disease: Secondary | ICD-10-CM | POA: Diagnosis not present

## 2017-08-24 LAB — BASIC METABOLIC PANEL
BUN: 45 mg/dL — ABNORMAL HIGH (ref 6–23)
CHLORIDE: 102 meq/L (ref 96–112)
CO2: 34 meq/L — AB (ref 19–32)
Calcium: 9.2 mg/dL (ref 8.4–10.5)
Creatinine, Ser: 2.2 mg/dL — ABNORMAL HIGH (ref 0.40–1.20)
GFR: 27.51 mL/min — ABNORMAL LOW (ref >60.00)
GLUCOSE: 97 mg/dL (ref 70–99)
POTASSIUM: 4.4 meq/L (ref 3.5–5.1)
Sodium: 141 mEq/L (ref 135–145)

## 2017-08-24 LAB — CBC WITH DIFFERENTIAL/PLATELET
BASOS PCT: 0.9 % (ref 0.0–3.0)
Basophils Absolute: 0 10*3/uL (ref 0.0–0.1)
EOS PCT: 3.6 % (ref 0.0–5.0)
Eosinophils Absolute: 0.1 10*3/uL (ref 0.0–0.7)
HCT: 29.5 % — ABNORMAL LOW (ref 36.0–46.0)
HEMOGLOBIN: 9.7 g/dL — AB (ref 12.0–15.0)
LYMPHS ABS: 1.3 10*3/uL (ref 0.7–4.0)
Lymphocytes Relative: 36.9 % (ref 12.0–46.0)
MCHC: 33 g/dL (ref 30.0–36.0)
MCV: 87.5 fl (ref 78.0–100.0)
Monocytes Absolute: 0.4 10*3/uL (ref 0.1–1.0)
Monocytes Relative: 10.7 % (ref 3.0–12.0)
NEUTROS ABS: 1.6 10*3/uL (ref 1.4–7.7)
NEUTROS PCT: 47.9 % (ref 43.0–77.0)
PLATELETS: 198 10*3/uL (ref 150.0–400.0)
RBC: 3.37 Mil/uL — ABNORMAL LOW (ref 3.87–5.11)
RDW: 13.3 % (ref 11.5–15.5)
WBC: 3.4 10*3/uL — ABNORMAL LOW (ref 4.0–10.5)

## 2017-08-24 LAB — SEDIMENTATION RATE: SED RATE: 13 mm/h (ref 0–30)

## 2017-08-24 LAB — C-REACTIVE PROTEIN: CRP: 0.2 mg/dL — ABNORMAL LOW (ref 0.5–20.0)

## 2017-08-24 NOTE — Telephone Encounter (Signed)
Pt seen today Please advise Dr Regis Bill, thanks.

## 2017-08-24 NOTE — Patient Instructions (Addendum)
Will arrange  Echocardiogram.  If you have increase of the swelling   Go back on  Twice a day lasix .   After your trip we may consider more evaluation for your arthritis   [name] in a month

## 2017-08-24 NOTE — Progress Notes (Signed)
Chief Complaint  Patient presents with  . Leg Swelling    Pt states that her left leg swelling has improved some but is still having quite a bit at times. Pain at times with increased swelling and some stiffness.     HPI: [name] 84 y.o. come in for Chronic disease management   Fu swelling and    Legs  Neg dvt   And given   Increase diuretic  For a week  80 bid and seems to help some still has  Radiating leg nerve pain from back the same    No falling .  No bleeding not sure why  Going back to hematology  r wrist  In spint to fy and had injec in trigger finger?  Some  Hand wrist difficulties   Breathing  Using  Neb to day  allergies some falr .   To go n cruise 5 d to [place]  from [place] no flying ? Ok to go?  No fever  Syncope.   Saw dr Hollie Salk last month and   No changes    ROS: See pertinent positives and negatives per HPI.  Past Medical History:  Diagnosis Date  . Allergy   . Anemia   . Anxiety   . Arthritis    "shoulders" (09/09/2012)  . Asthma    " onset when young"   . Diabetes mellitus without complication ([place])    "Borderline" per pt; being monitored.  no meds per pt  . GERD (gastroesophageal reflux disease)   . Headache(784.0)    "related to my high blood pressure" (09/09/2012)  . History of DVT (deep vein thrombosis)    "RLE" (09/09/2012) coumadine cant take asa so on plavix   . Hyperlipidemia   . Hypertension   . Lupus ([place])    "cured years ago" (09/09/2012)  . Varicose veins     Family History  Problem Relation Age of Onset  . Hypertension Mother   . Breast cancer Mother   . Hypertension Father   . Cancer Sister        Colon and Breast Cancer  . Breast cancer Sister   . Colon cancer Sister        ? 37' s dx - died in 93's  . Hypertension Brother   . Rectal cancer Brother   . Stomach cancer Brother   . Heart attack Daughter   . Esophageal cancer Daughter   . Lung cancer Unknown        both parents     Social History    Socioeconomic History  . Marital status: Single    Spouse name: Not on file  . Number of children: 6  . Years of education: Not on file  . Highest education level: Not on file  Occupational History  . Not on file  Social Needs  . Financial resource strain: Not on file  . Food insecurity:    Worry: Not on file    Inability: Not on file  . Transportation needs:    Medical: Not on file    Non-medical: Not on file  Tobacco Use  . Smoking status: Never Smoker  . Smokeless tobacco: Never Used  Substance and Sexual Activity  . Alcohol use: No  . Drug use: No  . Sexual activity: Not on file  Lifestyle  . Physical activity:    Days per week: Not on file    Minutes per session: Not on file  . Stress: Not on  file  Relationships  . Social connections:    Talks on phone: Not on file    Gets together: Not on file    Attends religious service: Not on file    Active member of club or organization: Not on file    Attends meetings of clubs or organizations: Not on file    Relationship status: Not on file  Other Topics Concern  . Not on file  Social History Narrative   2 people living in the home.  Grand daughter.   Up and down through the night      Had 7 children  2 deceased . 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    works 3 days per week currently .   Glasses dentures  Neg tad     Allergies as of 08/24/2017      Reactions   Penicillins Nausea And Vomiting, Other (See Comments)   Wheezing   Aspirin Other (See Comments)   Wheezing Acetaminophen is OK      Medication List        Accurate as of 08/24/17  6:08 PM. Always use your most recent med list.          acetaminophen 500 MG tablet Commonly known as:  TYLENOL Take 1,000 mg by mouth every 6 (six) hours as needed for moderate pain. Reported on 04/30/2015   ADVAIR DISKUS 250-50 MCG/DOSE Aepb Generic drug:  Fluticasone-Salmeterol USE 1  PUFF TWICE DAILY.   AEROCHAMBER PLUS inhaler Use as instructed   amLODipine-olmesartan 10-40 MG tablet Commonly known as:  AZOR Take 1 tablet by mouth daily.   carvedilol 12.5 MG tablet Commonly known as:  COREG Take 12.5 mg by mouth 2 (two) times daily with a meal.   citalopram 20 MG tablet Commonly known as:  CELEXA Take 1 tablet (20 mg total) by mouth daily.   clopidogrel 75 MG tablet Commonly known as:  PLAVIX TAKE 1 TABLET ONCE DAILY.   diclofenac sodium 1 % Gel Commonly known as:  VOLTAREN APPLY 4GM TO AFFECTED AREA(S) 4 TIMES A DAY.   doxazosin 2 MG tablet Commonly known as:  CARDURA TAKE 3 TABLETS AT BEDTIME.   fenofibrate 160 MG tablet TAKE 1 TABLET ONCE DAILY.   ferrous sulfate 325 (65 FE) MG EC tablet Take 1 tablet (325 mg total) by mouth daily with breakfast.   furosemide 80 MG tablet Commonly known as:  LASIX Take 80 mg by mouth daily.   lidocaine 5 % Commonly known as:  LIDODERM Place 1 patch onto the skin daily. Remove & Discard patch within 12 hours or as directed by MD   LORazepam 1 MG tablet Commonly known as:  ATIVAN TAKE 1 TABLET EVERY 8 HOURS AS NEEDED FOR ANXIETY.   metoprolol succinate 50 MG 24 hr tablet Commonly known as:  TOPROL-XL TAKE 1 TABLET ONCE DAILY WITH FOOD.   montelukast 10 MG tablet Commonly known as:  SINGULAIR TAKE 1 TABLET ONCE DAILY.   pantoprazole 40 MG tablet Commonly known as:  PROTONIX TAKE (1) TABLET TWICE A DAY BEFORE MEALS.   potassium chloride 10 MEQ tablet Commonly known as:  K-DUR TAKE 4 TABLETS DAILY.   PROAIR HFA 108 (90 Base) MCG/ACT inhaler Generic drug:  albuterol USE 2 PUFFS EVERY 6 HOURS AS NEEDED FOR WHEEZING.   albuterol (2.5 MG/3ML) 0.083% nebulizer solution Commonly known as:  PROVENTIL USE 1 AMPULE EVERY  6 HOURS AS NEEDED FOR WHEEZE   RESTASIS 0.05 % ophthalmic emulsion Generic drug:  cycloSPORINE   vitamin B-12 1000 MCG tablet Commonly known as:  CYANOCOBALAMIN Take 1,000 mcg by  mouth daily.         EXAM:  BP 132/60 (BP Location: Right Arm, Patient Position: Sitting, Cuff Size: Normal)   Pulse (!) 53   Temp 98.3 F (36.8 C) (Oral)   Wt 169 lb 12.8 oz (77 kg)   BMI 33.16 kg/m   Body mass index is 33.16 kg/m. Repeat bp 140/60 and 144/60  GENERAL: vitals reviewed and listed above, alert, oriented, appears well hydrated and in no acute distress HEENT: atraumatic, conjunctiva  clear, no obvious abnormalities on inspection of external nose and ears OP : no lesion edema or exudate  NECK: no obvious masses on inspection palpation  LUNGS: clear to auscultation bilaterally, no wheezes, rales or rhonchi, good air movement CV: HRRR, no clubbing cyanosis improved    peripheral edema l more than right  nl cap refill  MS: moves all extremities right wrist in point some swelling left wrist   PSYCH: pleasant and cooperative, no obvious depression or anxiety  BP Readings from Last 3 Encounters:  08/24/17 132/60  08/08/17 (!) 148/60  07/09/17 (!) 160/70   Wt Readings from Last 3 Encounters:  08/24/17 169 lb 12.8 oz (77 kg)  08/08/17 175 lb (79.4 kg)  07/09/17 169 lb 9.6 oz (76.9 kg)    x ray showed chronic vasc congestion   No acute findings   ASSESSMENT AND PLAN:  Discussed the following assessment and plan:  Edema, unspecified type - Plan: ECHOCARDIOGRAM COMPLETE  Renal insufficiency creatinine - orig 1.3 incresing to 1.5 dec gfr  - Plan: Basic metabolic panel, CBC with Differential/Platelet, ANA, Sedimentation rate, C-reactive protein  Anemia, chronic disease - Plan: Basic metabolic panel, CBC with Differential/Platelet, ANA, Sedimentation rate, C-reactive protein  Pain in both hands - Plan: ANA, Sedimentation rate, C-reactive protein  Essential hypertension - Plan: ECHOCARDIOGRAM COMPLETE  Hypertensive heart disease with hypertensive chronic kidney disease, unspecified CKD stage, unspecified whether heart failure present - Plan: ECHOCARDIOGRAM  COMPLETE  Medication management Some of problem may have been ffluid overload    Clinically goo response to med but  Needs close fu  See mild levation of bnp and  X ray   In setting of renal insufficieny .   Suggest further  cv eval  Echo and may want to get Cards input depending  ....  Will send info to the hematologist   Also  To see if you would get help being seen this year  For any treatments .   Ok to go on trip pending issues  -Patient advised to return or notify health care team  if  new concerns arise.  Patient Instructions  Will arrange  Echocardiogram.  If you have increase of the swelling   Go back on  Twice a day lasix .   After your trip we may consider more evaluation for your arthritis   [name] in a month     Kayliee Atienza K. Zilda No M.D.

## 2017-08-27 ENCOUNTER — Other Ambulatory Visit: Payer: Self-pay | Admitting: Internal Medicine

## 2017-08-27 ENCOUNTER — Ambulatory Visit: Payer: Medicare Other | Admitting: Internal Medicine

## 2017-08-27 NOTE — Telephone Encounter (Signed)
Pharmacy is calling to check on the status of this. Please advise.  [place], [place] [address]  Phone: [phone] Fax: [phone]

## 2017-08-28 LAB — ANA: ANA: NEGATIVE

## 2017-08-28 NOTE — Telephone Encounter (Signed)
DR. Regis Bill please advise.

## 2017-08-28 NOTE — Telephone Encounter (Signed)
Sent in

## 2017-08-29 ENCOUNTER — Other Ambulatory Visit: Payer: Self-pay | Admitting: Internal Medicine

## 2017-08-29 DIAGNOSIS — Z1231 Encounter for screening mammogram for malignant neoplasm of breast: Secondary | ICD-10-CM

## 2017-08-30 NOTE — Telephone Encounter (Signed)
Pt notified via mychart

## 2017-08-31 ENCOUNTER — Other Ambulatory Visit (HOSPITAL_COMMUNITY): Payer: Medicare Other

## 2017-08-31 ENCOUNTER — Ambulatory Visit
Admission: RE | Admit: 2017-08-31 | Discharge: 2017-08-31 | Disposition: A | Discharge status: Home / Self Care | Payer: Medicare Other | Source: Ambulatory Visit | Attending: Internal Medicine | Admitting: Internal Medicine

## 2017-08-31 ENCOUNTER — Other Ambulatory Visit: Payer: Self-pay | Admitting: Internal Medicine

## 2017-08-31 DIAGNOSIS — Z1231 Encounter for screening mammogram for malignant neoplasm of breast: Secondary | ICD-10-CM

## 2017-08-31 DIAGNOSIS — I6523 Occlusion and stenosis of bilateral carotid arteries: Secondary | ICD-10-CM

## 2017-08-31 DIAGNOSIS — R0989 Other specified symptoms and signs involving the circulatory and respiratory systems: Secondary | ICD-10-CM

## 2017-09-07 ENCOUNTER — Telehealth: Payer: Self-pay | Admitting: Internal Medicine

## 2017-09-07 ENCOUNTER — Other Ambulatory Visit: Payer: Self-pay | Admitting: Internal Medicine

## 2017-09-07 ENCOUNTER — Ambulatory Visit (HOSPITAL_COMMUNITY)
Admission: RE | Admit: 2017-09-07 | Discharge: 2017-09-07 | Disposition: A | Discharge status: Home / Self Care | Payer: Medicare Other | Source: Ambulatory Visit | Attending: Internal Medicine | Admitting: Internal Medicine

## 2017-09-07 DIAGNOSIS — I10 Essential (primary) hypertension: Secondary | ICD-10-CM | POA: Diagnosis not present

## 2017-09-07 DIAGNOSIS — R7989 Other specified abnormal findings of blood chemistry: Secondary | ICD-10-CM

## 2017-09-07 DIAGNOSIS — R0989 Other specified symptoms and signs involving the circulatory and respiratory systems: Secondary | ICD-10-CM

## 2017-09-07 DIAGNOSIS — E785 Hyperlipidemia, unspecified: Secondary | ICD-10-CM | POA: Insufficient documentation

## 2017-09-07 DIAGNOSIS — R42 Dizziness and giddiness: Secondary | ICD-10-CM | POA: Insufficient documentation

## 2017-09-07 DIAGNOSIS — E119 Type 2 diabetes mellitus without complications: Secondary | ICD-10-CM | POA: Diagnosis not present

## 2017-09-07 DIAGNOSIS — R51 Headache: Secondary | ICD-10-CM | POA: Insufficient documentation

## 2017-09-07 DIAGNOSIS — I6523 Occlusion and stenosis of bilateral carotid arteries: Secondary | ICD-10-CM

## 2017-09-07 NOTE — Telephone Encounter (Signed)
Mammogram   Is normal  No concern  . You should have been notified  By the [facility] center  Of results or get a letter .

## 2017-09-07 NOTE — Telephone Encounter (Signed)
LM for results

## 2017-09-07 NOTE — Telephone Encounter (Signed)
Pt is requesting Mammogram results from 08/31/17.  Please advise Dr Regis Bill, thanks.

## 2017-09-10 ENCOUNTER — Other Ambulatory Visit: Payer: Self-pay

## 2017-09-10 ENCOUNTER — Ambulatory Visit: Payer: Medicare Other | Admitting: Gastroenterology

## 2017-09-12 ENCOUNTER — Other Ambulatory Visit: Payer: Self-pay | Admitting: Internal Medicine

## 2017-09-12 ENCOUNTER — Other Ambulatory Visit (INDEPENDENT_AMBULATORY_CARE_PROVIDER_SITE_OTHER): Payer: Medicare Other

## 2017-09-12 DIAGNOSIS — R7989 Other specified abnormal findings of blood chemistry: Secondary | ICD-10-CM

## 2017-09-12 LAB — BASIC METABOLIC PANEL
BUN: 46 mg/dL — AB (ref 6–23)
CHLORIDE: 100 meq/L (ref 96–112)
CO2: 31 meq/L (ref 19–32)
CREATININE: 2.23 mg/dL — AB (ref 0.40–1.20)
Calcium: 9.5 mg/dL (ref 8.4–10.5)
GFR: 27.08 mL/min — ABNORMAL LOW (ref >60.00)
Glucose, Bld: 119 mg/dL — ABNORMAL HIGH (ref 70–99)
Potassium: 4 mEq/L (ref 3.5–5.1)
Sodium: 140 mEq/L (ref 135–145)

## 2017-09-14 ENCOUNTER — Ambulatory Visit (HOSPITAL_COMMUNITY): Payer: Medicare Other | Attending: Internal Medicine

## 2017-09-14 ENCOUNTER — Other Ambulatory Visit: Payer: Self-pay | Admitting: Internal Medicine

## 2017-09-14 ENCOUNTER — Other Ambulatory Visit: Payer: Self-pay

## 2017-09-14 DIAGNOSIS — I1310 Hypertensive heart and chronic kidney disease without heart failure, with stage 1 through stage 4 chronic kidney disease, or unspecified chronic kidney disease: Secondary | ICD-10-CM | POA: Diagnosis not present

## 2017-09-14 DIAGNOSIS — I10 Essential (primary) hypertension: Secondary | ICD-10-CM

## 2017-09-14 DIAGNOSIS — R609 Edema, unspecified: Secondary | ICD-10-CM | POA: Diagnosis not present

## 2017-09-14 DIAGNOSIS — E785 Hyperlipidemia, unspecified: Secondary | ICD-10-CM | POA: Insufficient documentation

## 2017-09-14 DIAGNOSIS — N189 Chronic kidney disease, unspecified: Secondary | ICD-10-CM | POA: Diagnosis not present

## 2017-09-14 DIAGNOSIS — I253 Aneurysm of heart: Secondary | ICD-10-CM | POA: Diagnosis not present

## 2017-09-14 DIAGNOSIS — E1122 Type 2 diabetes mellitus with diabetic chronic kidney disease: Secondary | ICD-10-CM | POA: Diagnosis not present

## 2017-09-18 ENCOUNTER — Other Ambulatory Visit: Payer: Self-pay | Admitting: Internal Medicine

## 2017-09-18 NOTE — Telephone Encounter (Signed)
Left detailed message - pt aware that I am sending results to mychart as well.  Advised to call back if any questions regarding results.

## 2017-09-21 DIAGNOSIS — M1811 Unilateral primary osteoarthritis of first carpometacarpal joint, right hand: Secondary | ICD-10-CM | POA: Diagnosis not present

## 2017-09-21 DIAGNOSIS — M65332 Trigger finger, left middle finger: Secondary | ICD-10-CM | POA: Diagnosis not present

## 2017-09-27 ENCOUNTER — Emergency Department (HOSPITAL_COMMUNITY)
Admission: EM | Admit: 2017-09-27 | Discharge: 2017-09-27 | Disposition: A | Discharge status: Home / Self Care | Payer: No Typology Code available for payment source | Attending: Emergency Medicine | Admitting: Emergency Medicine

## 2017-09-27 ENCOUNTER — Encounter (HOSPITAL_COMMUNITY): Payer: Self-pay | Admitting: Nurse Practitioner

## 2017-09-27 ENCOUNTER — Emergency Department (HOSPITAL_COMMUNITY): Payer: No Typology Code available for payment source

## 2017-09-27 ENCOUNTER — Other Ambulatory Visit: Payer: Self-pay

## 2017-09-27 DIAGNOSIS — N183 Chronic kidney disease, stage 3 (moderate): Secondary | ICD-10-CM | POA: Diagnosis not present

## 2017-09-27 DIAGNOSIS — I129 Hypertensive chronic kidney disease with stage 1 through stage 4 chronic kidney disease, or unspecified chronic kidney disease: Secondary | ICD-10-CM | POA: Diagnosis not present

## 2017-09-27 DIAGNOSIS — E119 Type 2 diabetes mellitus without complications: Secondary | ICD-10-CM | POA: Diagnosis not present

## 2017-09-27 DIAGNOSIS — Z79899 Other long term (current) drug therapy: Secondary | ICD-10-CM | POA: Insufficient documentation

## 2017-09-27 DIAGNOSIS — R42 Dizziness and giddiness: Secondary | ICD-10-CM | POA: Diagnosis not present

## 2017-09-27 DIAGNOSIS — Z041 Encounter for examination and observation following transport accident: Secondary | ICD-10-CM | POA: Diagnosis not present

## 2017-09-27 DIAGNOSIS — R55 Syncope and collapse: Secondary | ICD-10-CM | POA: Insufficient documentation

## 2017-09-27 DIAGNOSIS — M542 Cervicalgia: Secondary | ICD-10-CM | POA: Diagnosis not present

## 2017-09-27 DIAGNOSIS — J45909 Unspecified asthma, uncomplicated: Secondary | ICD-10-CM | POA: Diagnosis not present

## 2017-09-27 DIAGNOSIS — M791 Myalgia, unspecified site: Secondary | ICD-10-CM | POA: Diagnosis not present

## 2017-09-27 LAB — BASIC METABOLIC PANEL
ANION GAP: 13 (ref 5–15)
BUN: 41 mg/dL — AB (ref 6–20)
CHLORIDE: 98 mmol/L — AB (ref 101–111)
CO2: 27 mmol/L (ref 22–32)
Calcium: 9.7 mg/dL (ref 8.9–10.3)
Creatinine, Ser: 1.88 mg/dL — ABNORMAL HIGH (ref 0.44–1.00)
GFR calc Af Amer: 28 mL/min — ABNORMAL LOW (ref >60)
GFR calc non Af Amer: 24 mL/min — ABNORMAL LOW (ref >60)
Glucose, Bld: 104 mg/dL — ABNORMAL HIGH (ref 65–99)
POTASSIUM: 3.7 mmol/L (ref 3.5–5.1)
SODIUM: 138 mmol/L (ref 135–145)

## 2017-09-27 LAB — URINALYSIS, ROUTINE W REFLEX MICROSCOPIC
BACTERIA UA: NONE SEEN
Bilirubin Urine: NEGATIVE
GLUCOSE, UA: NEGATIVE mg/dL
Ketones, ur: NEGATIVE mg/dL
LEUKOCYTES UA: NEGATIVE
Nitrite: NEGATIVE
PH: 7 (ref 5.0–8.0)
Protein, ur: 100 mg/dL — AB
SPECIFIC GRAVITY, URINE: 1.004 — AB (ref 1.005–1.030)

## 2017-09-27 LAB — CBC WITH DIFFERENTIAL/PLATELET
BASOS ABS: 0 10*3/uL (ref 0.0–0.1)
Basophils Relative: 1 %
EOS PCT: 3 %
Eosinophils Absolute: 0.1 10*3/uL (ref 0.0–0.7)
HEMATOCRIT: 33.4 % — AB (ref 36.0–46.0)
HEMOGLOBIN: 10.7 g/dL — AB (ref 12.0–15.0)
LYMPHS ABS: 1.4 10*3/uL (ref 0.7–4.0)
LYMPHS PCT: 36 %
MCH: 28.1 pg (ref 26.0–34.0)
MCHC: 32 g/dL (ref 30.0–36.0)
MCV: 87.7 fL (ref 78.0–100.0)
Monocytes Absolute: 0.2 10*3/uL (ref 0.1–1.0)
Monocytes Relative: 5 %
NEUTROS ABS: 2.1 10*3/uL (ref 1.7–7.7)
Neutrophils Relative %: 55 %
PLATELETS: 224 10*3/uL (ref 150–400)
RBC: 3.81 MIL/uL — AB (ref 3.87–5.11)
RDW: 13.1 % (ref 11.5–15.5)
WBC: 3.9 10*3/uL — AB (ref 4.0–10.5)

## 2017-09-27 NOTE — Progress Notes (Signed)
Chief Complaint  Patient presents with  . Follow-up    HPI: [name] 84 y.o. come in for Chronic disease management  Fu    bp swelling  Etc  'however yesterday  Seen in ED   After episode of passing out  When driving   See ed note  Members ome dizziness and trying to get over [place]?  And then remembers  Police tapping on window  Of her care  Taken to ed by ems. Neg head neck imaging and labs and ekg bu bp was elevated .   Since then  Feels ok physically .  No remembered  pre syncope sx otherwise but can get heart racing or palpitation at times .  Last med given was per dr  Hollie Salk carvedilol   ROS: See pertinent positives and negatives per HPI. No bleeding  Left leg feels stiff at times  No change in back pain or swelling otherwise   Did go to  Trip to [place]   Recent .  No signifcant hx   Past Medical History:  Diagnosis Date  . Allergy   . Anemia   . Anxiety   . Arthritis    "shoulders" (09/09/2012)  . Asthma    " onset when young"   . Diabetes mellitus without complication ([place])    "Borderline" per pt; being monitored.  no meds per pt  . GERD (gastroesophageal reflux disease)   . Headache(784.0)    "related to my high blood pressure" (09/09/2012)  . History of DVT (deep vein thrombosis)    "RLE" (09/09/2012) coumadine cant take asa so on plavix   . Hyperlipidemia   . Hypertension   . Lupus ([place])    "cured years ago" (09/09/2012)  . Varicose veins     Family History  Problem Relation Age of Onset  . Hypertension Mother   . Breast cancer Mother   . Hypertension Father   . Cancer Sister        Colon and Breast Cancer  . Breast cancer Sister   . Colon cancer Sister        ? 13' s dx - died in 22's  . Hypertension Brother   . Rectal cancer Brother   . Stomach cancer Brother   . Breast cancer Brother   . Heart attack Daughter   . Esophageal cancer Daughter   . Lung cancer Unknown        both parents   . Breast cancer Paternal Aunt     Social History    Socioeconomic History  . Marital status: Single    Spouse name: Not on file  . Number of children: 6  . Years of education: Not on file  . Highest education level: Not on file  Occupational History  . Not on file  Social Needs  . Financial resource strain: Not on file  . Food insecurity:    Worry: Not on file    Inability: Not on file  . Transportation needs:    Medical: Not on file    Non-medical: Not on file  Tobacco Use  . Smoking status: Never Smoker  . Smokeless tobacco: Never Used  Substance and Sexual Activity  . Alcohol use: No  . Drug use: No  . Sexual activity: Not on file  Lifestyle  . Physical activity:    Days per week: Not on file    Minutes per session: Not on file  . Stress: Not on file  Relationships  .  Social connections:    Talks on phone: Not on file    Gets together: Not on file    Attends religious service: Not on file    Active member of club or organization: Not on file    Attends meetings of clubs or organizations: Not on file    Relationship status: Not on file  Other Topics Concern  . Not on file  Social History Narrative   2 people living in the home.  Grand daughter.   Up and down through the night      Had 7 children  2 deceased . 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    works 3 days per week currently .   Glasses dentures  Neg tad     Outpatient Medications Prior to Visit  Medication Sig Dispense Refill  . acetaminophen (TYLENOL) 500 MG tablet Take 1,000 mg by mouth every 6 (six) hours as needed for moderate pain. Reported on 04/30/2015    . ADVAIR DISKUS 250-50 MCG/DOSE AEPB USE 1 PUFF TWICE DAILY. 60 each 5  . albuterol (PROVENTIL) (2.5 MG/3ML) 0.083% nebulizer solution USE 1 AMPULE EVERY 6 HOURS AS NEEDED FOR WHEEZE 75 mL 0  . amLODipine-olmesartan (AZOR) 10-40 MG tablet Take 1 tablet by mouth daily.    . carvedilol (COREG) 12.5 MG tablet Take 12.5  mg by mouth 2 (two) times daily with a meal.    . citalopram (CELEXA) 20 MG tablet Take 1 tablet (20 mg total) by mouth daily. (Patient taking differently: Take 20 mg by mouth daily as needed (depression). ) 30 tablet 5  . clopidogrel (PLAVIX) 75 MG tablet TAKE 1 TABLET ONCE DAILY. 30 tablet 3  . diclofenac sodium (VOLTAREN) 1 % GEL APPLY 4GM TO AFFECTED AREA(S) 4 TIMES A DAY. (Patient taking differently: APPLY 4GM TO AFFECTED AREA(S) 4 TIMES A DAY FOR PAIN) 100 g 0  . doxazosin (CARDURA) 2 MG tablet TAKE 3 TABLETS AT BEDTIME. 90 tablet 1  . fenofibrate 160 MG tablet TAKE 1 TABLET ONCE DAILY. 90 tablet 0  . ferrous sulfate 325 (65 FE) MG EC tablet Take 1 tablet (325 mg total) by mouth daily with breakfast. 30 tablet 3  . fluorometholone (FML) 0.1 % ophthalmic suspension Place 1 drop into both eyes 2 (two) times daily.     . furosemide (LASIX) 80 MG tablet Take 80 mg by mouth daily.    [name] lidocaine (LIDODERM) 5 % Place 1 patch onto the skin daily. Remove & Discard patch within 12 hours or as directed by MD (Patient taking differently: Place 1 patch onto the skin daily as needed (pain). Remove & Discard patch within 12 hours or as directed by MD) 30 patch 0  . LORazepam (ATIVAN) 1 MG tablet Take 1 tablet (1 mg total) by mouth every 8 (eight) hours as needed for anxiety. Avoid regular use 24 tablet 0  . montelukast (SINGULAIR) 10 MG tablet TAKE 1 TABLET ONCE DAILY. 90 tablet 0  . pantoprazole (PROTONIX) 40 MG tablet TAKE (1) TABLET TWICE A DAY BEFORE MEALS. 60 tablet 0  . potassium chloride (K-DUR) 10 MEQ tablet TAKE 4 TABLETS DAILY. 120 tablet 1  . PROAIR HFA 108 (90 Base) MCG/ACT inhaler USE 2 PUFFS EVERY 6 HOURS AS NEEDED FOR WHEEZING. 8.5 g 0  . RESTASIS 0.05 % ophthalmic emulsion Place 1 drop into both eyes 2 (two) times daily.     [name]  Spacer/Aero-Holding Chambers (AEROCHAMBER PLUS) inhaler Use as instructed 1 each 2  . vitamin B-12 (CYANOCOBALAMIN) 1000 MCG tablet Take 1,000 mcg by mouth daily.      . metoprolol succinate (TOPROL-XL) 50 MG 24 hr tablet TAKE 1 TABLET ONCE DAILY WITH FOOD. (Patient not taking: Reported on 09/27/2017) 30 tablet 2   No facility-administered medications prior to visit.      EXAM:  BP (!) 142/60 (BP Location: Left Arm)   Pulse 60   Temp 97.9 F (36.6 C) (Oral)   Ht 5' (1.524 m)   Wt 166 lb 9.6 oz (75.6 kg)   SpO2 96%   BMI 32.54 kg/m   Body mass index is 32.54 kg/m.  GENERAL: vitals reviewed and listed above, alert, oriented, appears well hydrated and in no acute distress ambulatory with right wrist in support  HEENT: atraumatic, conjunctiva  Clear r eye tears some, no obvious abnormalities on inspection of external nose and ears  NECK: no obvious masses on inspection palpation  LUNGS: clear to auscultation bilaterally, no wheezes, rales or rhonchi,  CV: HRRR, no clubbing cyanosis og m   Stable  perripheral edema left more than right nl cap refill  Abdomen:  Sof,t normal bowel sounds without hepatosplenomegaly, no guarding rebound or masses no CVA tenderness ambulatory  Neuro frossly stable  Non focal PSYCH: pleasant and cooperative, no obvious depression or anxiety Lab Results  Component Value Date   WBC 3.9 (L) 09/27/2017   HGB 10.7 (L) 09/27/2017   HCT 33.4 (L) 09/27/2017   PLT 224 09/27/2017   GLUCOSE 104 (H) 09/27/2017   CHOL 170 07/09/2017   TRIG 118.0 07/09/2017   HDL 57.40 07/09/2017   LDLCALC 89 07/09/2017   ALT 12 07/09/2017   AST 25 07/09/2017   NA 138 09/27/2017   K 3.7 09/27/2017   CL 98 (L) 09/27/2017   CREATININE 1.88 (H) 09/27/2017   BUN 41 (H) 09/27/2017   CO2 27 09/27/2017   TSH 1.31 07/09/2017   INR 0.94 12/22/2009   HGBA1C 5.9 07/09/2017   MICROALBUR 10.7 (H) 04/25/2016   BP Readings from Last 3 Encounters:  09/28/17 (!) 142/60  09/27/17 (!) 188/57  08/24/17 132/60    Study Conclusions  - Left ventricle: The cavity size was normal. There was moderate   concentric hypertrophy. Systolic function was  normal. The   estimated ejection fraction was in the range of 60% to 65%. Wall   motion was normal; there were no regional wall motion   abnormalities. Left ventricular diastolic function parameters   were normal. - Aortic valve: Moderate focal calcification involving the right   coronary cusp. - Mitral valve: Mild focal calcification of the anterior leaflet   (medial segment(s)). There was trivial regurgitation. - Left atrium: The atrium was mildly dilated. - Atrial septum: There was a small atrial septal aneurysm,   predominantly within the right atrial cavity. - Tricuspid valve: There was mild regurgitation. - Pulmonary arteries: PA peak pressure: 45 mm Hg (S).  Impressions:  - The right ventricular systolic pressure was increased consistent   with moderate pulmonary hypertension. ASSESSMENT AND PLAN:  Discussed the following assessment and plan:  Syncope, unspecified syncope type - Plan: Ambulatory referral to Cardiology, Ambulatory referral to Pulmonology  Hypertensive heart disease with hypertensive chronic kidney disease, unspecified CKD stage, unspecified whether heart failure present - Plan: Ambulatory referral to Cardiology  Pulmonary hypertension ([place]) pap 45 on echo - Plan: Ambulatory referral to Cardiology, Ambulatory referral to Pulmonology  Medication  management  Asthma, chronic, unspecified asthma severity, with acute exacerbation - Plan: Ambulatory referral to Pulmonology  Anemia of chronic disease Plan referral  To cardiology and pulmonary   For syncope unexpected and PAH?   By echo   She had some brady and ? If need to dec the  carvediol but doubt that is the main issue   -Patient advised to return or notify health care team  if  new concerns arise.  Patient Instructions  Plan referral to cardiology   .    And pulmonary  For the  Passing out  As discussed .   If getting  Low bp or continuing seek care immediately   consideration of   Changes in  medication Stay hydrated .    [name] after  2 mos    Syncope Syncope is when you temporarily lose consciousness. Syncope may also be called fainting or passing out. It is caused by a sudden decrease in blood flow to the brain. Even though most causes of syncope are not dangerous, syncope can be a sign of a serious medical problem. Signs that you may be about to faint include:  Feeling dizzy or light-headed.  Feeling nauseous.  Seeing all white or all black in your field of vision.  Having cold, clammy skin.  If you fainted, get medical help right away.Call your local emergency services (911 in the [place] Do not drive yourself to the hospital. Follow these instructions at home: Pay attention to any changes in your symptoms. Take these actions to help with your condition:  Have someone stay with you until you feel stable.  Do not drive, use machinery, or play sports until your health care provider says it is okay.  Keep all follow-up visits as told by your health care provider. This is important.  If you start to feel like you might faint, lie down right away and raise (elevate) your feet above the level of your heart. Breathe deeply and steadily. Wait until all of the symptoms have passed.  Drink enough fluid to keep your urine clear or pale yellow.  If you are taking blood pressure or heart medicine, get up slowly and take several minutes to sit and then stand. This can reduce dizziness.  Take over-the-counter and prescription medicines only as told by your health care provider.  Get help right away if:  You have a severe headache.  You have unusual pain in your chest, abdomen, or back.  You are bleeding from your mouth or rectum, or you have black or tarry stool.  You have a very fast or irregular heartbeat (palpitations).  You have pain with breathing.  You faint once or repeatedly.  You have a seizure.  You are confused.  You have trouble walking.  You have  severe weakness.  You have vision problems. These symptoms may represent a serious problem that is an emergency. Do not wait to see if your symptoms will go away. Get medical help right away. Call your local emergency services (911 in the [place] Do not drive yourself to the hospital. This information is not intended to replace advice given to you by your health care provider. Make sure you discuss any questions you have with your health care provider. Document Released: 05/01/2005 Document Revised: 10/07/2015 Document Reviewed: 01/13/2015 Elsevier Interactive Patient Education  2018 [place]. Panosh M.D.

## 2017-09-27 NOTE — Discharge Instructions (Addendum)
Please read instructions below. Apply ice to your areas of pain for 20 minutes at a time. You can take tylenol every 4-6 hours as needed for pain. Attend your appointment with your primary care provider to follow up on your visit today and recheck your blood pressure. Return to the ER for chest pain, severe headache, vision changes, if new numbness or tingling in your arms or legs, inability to urinate, inability to hold your bowels, or weakness in your extremities.

## 2017-09-27 NOTE — ED Notes (Addendum)
Pt ambulated without assistance. Pt denies dizziness but endorses episodes of "lightheadedness." Pt ambulated to restroom. Pt requesting to sit up on the side of the bed. Pts family remains at bedside.
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
## 2017-09-27 NOTE — ED Notes (Addendum)
Patient transported to CT 
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
## 2017-09-28 ENCOUNTER — Ambulatory Visit (INDEPENDENT_AMBULATORY_CARE_PROVIDER_SITE_OTHER): Payer: Medicare Other | Admitting: Internal Medicine

## 2017-09-28 ENCOUNTER — Encounter: Payer: Self-pay | Admitting: Internal Medicine

## 2017-09-28 VITALS — BP 142/60 | HR 60 | Temp 97.9°F | Ht 60.0 in | Wt 166.6 lb

## 2017-09-28 DIAGNOSIS — Z79899 Other long term (current) drug therapy: Secondary | ICD-10-CM

## 2017-09-28 DIAGNOSIS — I1310 Hypertensive heart and chronic kidney disease without heart failure, with stage 1 through stage 4 chronic kidney disease, or unspecified chronic kidney disease: Secondary | ICD-10-CM

## 2017-09-28 DIAGNOSIS — I272 Pulmonary hypertension, unspecified: Secondary | ICD-10-CM

## 2017-09-28 DIAGNOSIS — D638 Anemia in other chronic diseases classified elsewhere: Secondary | ICD-10-CM

## 2017-09-28 DIAGNOSIS — J45901 Unspecified asthma with (acute) exacerbation: Secondary | ICD-10-CM

## 2017-09-28 DIAGNOSIS — R55 Syncope and collapse: Secondary | ICD-10-CM | POA: Diagnosis not present

## 2017-09-28 NOTE — Patient Instructions (Signed)
Plan referral to cardiology   .    And pulmonary  For the  Passing out  As discussed .   If getting  Low bp or continuing seek care immediately   consideration of   Changes in medication Stay hydrated .    [name] after  2 mos    Syncope Syncope is when you temporarily lose consciousness. Syncope may also be called fainting or passing out. It is caused by a sudden decrease in blood flow to the brain. Even though most causes of syncope are not dangerous, syncope can be a sign of a serious medical problem. Signs that you may be about to faint include:  Feeling dizzy or light-headed.  Feeling nauseous.  Seeing all white or all black in your field of vision.  Having cold, clammy skin.  If you fainted, get medical help right away.Call your local emergency services (911 in the [place] Do not drive yourself to the hospital. Follow these instructions at home: Pay attention to any changes in your symptoms. Take these actions to help with your condition:  Have someone stay with you until you feel stable.  Do not drive, use machinery, or play sports until your health care provider says it is okay.  Keep all follow-up visits as told by your health care provider. This is important.  If you start to feel like you might faint, lie down right away and raise (elevate) your feet above the level of your heart. Breathe deeply and steadily. Wait until all of the symptoms have passed.  Drink enough fluid to keep your urine clear or pale yellow.  If you are taking blood pressure or heart medicine, get up slowly and take several minutes to sit and then stand. This can reduce dizziness.  Take over-the-counter and prescription medicines only as told by your health care provider.  Get help right away if:  You have a severe headache.  You have unusual pain in your chest, abdomen, or back.  You are bleeding from your mouth or rectum, or you have black or tarry stool.  You have a very fast or irregular  heartbeat (palpitations).  You have pain with breathing.  You faint once or repeatedly.  You have a seizure.  You are confused.  You have trouble walking.  You have severe weakness.  You have vision problems. These symptoms may represent a serious problem that is an emergency. Do not wait to see if your symptoms will go away. Get medical help right away. Call your local emergency services (911 in the [place] Do not drive yourself to the hospital. This information is not intended to replace advice given to you by your health care provider. Make sure you discuss any questions you have with your health care provider. Document Released: 05/01/2005 Document Revised: 10/07/2015 Document Reviewed: 01/13/2015 Elsevier Interactive Patient Education  [employer].

## 2017-10-05 ENCOUNTER — Ambulatory Visit: Payer: Medicare Other | Admitting: Pulmonary Disease

## 2017-10-05 ENCOUNTER — Encounter: Payer: Self-pay | Admitting: Pulmonary Disease

## 2017-10-05 VITALS — BP 110/58 | HR 56 | Ht 60.0 in | Wt 167.0 lb

## 2017-10-05 DIAGNOSIS — I2729 Other secondary pulmonary hypertension: Secondary | ICD-10-CM | POA: Diagnosis not present

## 2017-10-05 DIAGNOSIS — J45909 Unspecified asthma, uncomplicated: Secondary | ICD-10-CM

## 2017-10-05 DIAGNOSIS — R0600 Dyspnea, unspecified: Secondary | ICD-10-CM

## 2017-10-05 DIAGNOSIS — R400 Somnolence: Secondary | ICD-10-CM

## 2017-10-05 DIAGNOSIS — Z86718 Personal history of other venous thrombosis and embolism: Secondary | ICD-10-CM | POA: Diagnosis not present

## 2017-10-05 NOTE — Patient Instructions (Signed)
Pulmonary hypertension: This term means that the blood pressure in your lungs is elevated, I worry that that may be due to your asthma.  I also worry about the possibility of chronic blood clots or obstructive sleep apnea so we are going to investigate both of those. We will arrange for home sleep study We will arrange for a test called a VQ scan to look to see if the blood clots you have experienced in the past may have gone to your lungs  Asthma: Continue Advair as you are doing For now continue albuterol as you are doing We will check a lung function test  Daytime sleepiness: Because you have this as well as pulmonary hypertension we are going to check a home sleep study to see if you have a condition called obstructive sleep apnea.  Follow-up with me in 2 to 4 weeks or sooner if needed

## 2017-10-05 NOTE — Addendum Note (Signed)
Addended by: Dolores Lory on: 10/05/2017 10:59 AM   Modules accepted: Orders
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
## 2017-10-15 ENCOUNTER — Ambulatory Visit (HOSPITAL_COMMUNITY)
Admission: RE | Admit: 2017-10-15 | Discharge: 2017-10-15 | Disposition: A | Discharge status: Home / Self Care | Payer: Medicare Other | Source: Ambulatory Visit | Attending: Pulmonary Disease | Admitting: Pulmonary Disease

## 2017-10-15 DIAGNOSIS — R0602 Shortness of breath: Secondary | ICD-10-CM | POA: Diagnosis not present

## 2017-10-15 DIAGNOSIS — I2729 Other secondary pulmonary hypertension: Secondary | ICD-10-CM

## 2017-10-15 DIAGNOSIS — I517 Cardiomegaly: Secondary | ICD-10-CM | POA: Diagnosis not present

## 2017-10-15 DIAGNOSIS — R42 Dizziness and giddiness: Secondary | ICD-10-CM | POA: Diagnosis not present

## 2017-10-15 MED ORDER — TECHNETIUM TO 99M ALBUMIN AGGREGATED
5.0000 | Freq: Once | INTRAVENOUS | Status: AC | PRN
  Administered 2017-10-15: 5 via INTRAVENOUS

## 2017-10-15 MED ORDER — TECHNETIUM TC 99M DIETHYLENETRIAME-PENTAACETIC ACID
31.0000 | Freq: Once | INTRAVENOUS | Status: AC | PRN
  Administered 2017-10-15: 31 via INTRAVENOUS

## 2017-10-16 ENCOUNTER — Telehealth: Payer: Self-pay | Admitting: Pulmonary Disease

## 2017-10-16 NOTE — Telephone Encounter (Signed)
Notes recorded by Juanito Doom, MD on 10/15/2017 at 1:55 PM EDT A, Please let the patient know this was OK Thanks, B ---------------------- Spoke with pt. She is aware of her VQ scan results. Nothing further was needed.

## 2017-10-18 ENCOUNTER — Other Ambulatory Visit: Payer: Self-pay | Admitting: Internal Medicine

## 2017-10-23 ENCOUNTER — Other Ambulatory Visit: Payer: Self-pay | Admitting: Internal Medicine

## 2017-10-26 ENCOUNTER — Telehealth: Payer: Self-pay | Admitting: *Deleted

## 2017-10-26 ENCOUNTER — Telehealth: Payer: Self-pay | Admitting: Internal Medicine

## 2017-10-26 DIAGNOSIS — Z0279 Encounter for issue of other medical certificate: Secondary | ICD-10-CM
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
## 2017-10-26 NOTE — Telephone Encounter (Signed)
Copied from [place] [id number]. Topic: Inquiry >> Oct 25, 2017  5:06 PM Neva Seat wrote: Pt has papers from [facility] [employer] that need to be filled out by Dr. Regis Bill. Please let know when she can come by.

## 2017-10-29 ENCOUNTER — Other Ambulatory Visit: Payer: Self-pay | Admitting: Internal Medicine

## 2017-10-29 NOTE — Telephone Encounter (Signed)
Duplicate, please see previous note.

## 2017-10-30 NOTE — Telephone Encounter (Signed)
Form is in Dr Regis Bill red folder to be filled out  Will also send to Sunday Spillers to follow up

## 2017-10-31 ENCOUNTER — Encounter: Payer: Self-pay | Admitting: Gastroenterology

## 2017-10-31 ENCOUNTER — Ambulatory Visit: Payer: Medicare Other | Admitting: Gastroenterology

## 2017-10-31 VITALS — BP 158/60 | HR 60 | Ht 60.0 in | Wt 170.0 lb

## 2017-10-31 DIAGNOSIS — R1013 Epigastric pain: Secondary | ICD-10-CM

## 2017-10-31 DIAGNOSIS — M6208 Separation of muscle (nontraumatic), other site: Secondary | ICD-10-CM | POA: Diagnosis not present

## 2017-10-31 DIAGNOSIS — G8929 Other chronic pain: Secondary | ICD-10-CM

## 2017-10-31 DIAGNOSIS — D638 Anemia in other chronic diseases classified elsewhere: Secondary | ICD-10-CM

## 2017-10-31 NOTE — Patient Instructions (Addendum)
Stop iron tablet for the next 2 weeks.  I will send my note to Dr. Regis Bill and Dr. Hollie Salk (your kidney doctor)  Try Miralax powder, one half to one whole capful in a glass of any liquid once daily.  If you are age 82 or older, your body mass index should be between 23-30. Your Body mass index is 33.2 kg/m. If this is out of the aforementioned range listed, please consider follow up with your Primary Care Provider.  If you are age 32 or younger, your body mass index should be between 19-25. Your Body mass index is 33.2 kg/m. If this is out of the aformentioned range listed, please consider follow up with your Primary Care Provider.   It was a pleasure to meet you today!  Dr. Loletha Carrow

## 2017-10-31 NOTE — Progress Notes (Signed)
[facility] Progress Note  Chief Complaint: Epigastric pain  Subjective  History:  This is a pleasant 84 year old woman last seen in clinic December 2018 for upper abdominal pain.  She had a large rectus diastasis at that time that was tender on palpation. Upper endoscopy on 04/26/2017 revealed mild patchy gastric erythema, biopsy negative for H. Pylori.  Tony is generally feeling better since her last visit with me.  Her appetite is come back, her weight stabilized.  She is troubled by constipation, and needs to take suppositories to maintain regularity.  She has apparently been doing this for some time but had not sure that the last visit.  She denies nausea or dysphagia. I also asked her more about her history of anemia.  She does have a nephrologist, believe she saw him a couple of months ago and has another appointment coming up in the next month or 2.  I also discovered that she had previously seen Dr. Burr Medico  from hematology as recently as February 2018 for iron deficiency anemia, maintained on oral iron.  ROS: Cardiovascular:  no chest pain Respiratory: no dyspnea Anxiety The patient's Past Medical, Family and Social History were reviewed and are on file in the EMR.  Objective:  Med list reviewed  Current Outpatient Medications:  .  acetaminophen (TYLENOL) 500 MG tablet, Take 1,000 mg by mouth every 6 (six) hours as needed for moderate pain. Reported on 04/30/2015, Disp: , Rfl:  .  ADVAIR DISKUS 250-50 MCG/DOSE AEPB, USE 1 PUFF TWICE DAILY., Disp: 60 each, Rfl: 5 .  albuterol (PROVENTIL) (2.5 MG/3ML) 0.083% nebulizer solution, USE 1 AMPULE EVERY 6 HOURS AS NEEDED FOR WHEEZE, Disp: 75 mL, Rfl: 0 .  amLODipine-olmesartan (AZOR) 10-40 MG tablet, Take 1 tablet by mouth daily., Disp: , Rfl:  .  carvedilol (COREG) 12.5 MG tablet, Take 12.5 mg by mouth 2 (two) times daily with a meal., Disp: , Rfl:  .  citalopram (CELEXA) 20 MG tablet, Take 1 tablet (20 mg total) by mouth  daily. (Patient taking differently: Take 20 mg by mouth daily as needed (depression). ), Disp: 30 tablet, Rfl: 5 .  clopidogrel (PLAVIX) 75 MG tablet, TAKE 1 TABLET ONCE DAILY., Disp: 30 tablet, Rfl: 2 .  diclofenac sodium (VOLTAREN) 1 % GEL, APPLY 4GM TO AFFECTED AREA(S) 4 TIMES A DAY. (Patient taking differently: APPLY 4GM TO AFFECTED AREA(S) 4 TIMES A DAY FOR PAIN), Disp: 100 g, Rfl: 0 .  doxazosin (CARDURA) 2 MG tablet, TAKE 3 TABLETS AT BEDTIME., Disp: 90 tablet, Rfl: 1 .  fenofibrate 160 MG tablet, TAKE 1 TABLET ONCE DAILY., Disp: 90 tablet, Rfl: 0 .  ferrous sulfate 325 (65 FE) MG EC tablet, Take 1 tablet (325 mg total) by mouth daily with breakfast., Disp: 30 tablet, Rfl: 3 .  fluorometholone (FML) 0.1 % ophthalmic suspension, Place 1 drop into both eyes 2 (two) times daily. , Disp: , Rfl:  .  furosemide (LASIX) 80 MG tablet, Take 80 mg by mouth daily., Disp: , Rfl:  .  lidocaine (LIDODERM) 5 %, Place 1 patch onto the skin daily. Remove & Discard patch within 12 hours or as directed by MD (Patient taking differently: Place 1 patch onto the skin daily as needed (pain). Remove & Discard patch within 12 hours or as directed by MD), Disp: 30 patch, Rfl: 0 .  LORazepam (ATIVAN) 1 MG tablet, Take 1 tablet (1 mg total) by mouth every 8 (eight) hours as needed for anxiety. Avoid regular  use, Disp: 24 tablet, Rfl: 0 .  montelukast (SINGULAIR) 10 MG tablet, TAKE 1 TABLET ONCE DAILY., Disp: 90 tablet, Rfl: 0 .  pantoprazole (PROTONIX) 40 MG tablet, TAKE (1) TABLET TWICE A DAY BEFORE MEALS., Disp: 60 tablet, Rfl: 0 .  potassium chloride (K-DUR) 10 MEQ tablet, TAKE 4 TABLETS DAILY., Disp: 120 tablet, Rfl: 1 .  PROAIR HFA 108 (90 Base) MCG/ACT inhaler, USE 2 PUFFS EVERY 6 HOURS AS NEEDED FOR WHEEZING., Disp: 8.5 g, Rfl: 0 .  RESTASIS 0.05 % ophthalmic emulsion, Place 1 drop into both eyes 2 (two) times daily. , Disp: , Rfl:  .  Spacer/Aero-Holding Chambers (AEROCHAMBER PLUS) inhaler, Use as instructed,  Disp: 1 each, Rfl: 2 .  vitamin B-12 (CYANOCOBALAMIN) 1000 MCG tablet, Take 1,000 mcg by mouth daily., Disp: , Rfl:    Vital signs in last 24 hrs: Vitals:   10/31/17 1414  BP: (!) 158/60  Pulse: 60    Physical Exam  Well-appearing, accompanied by granddaughter  HEENT: sclera anicteric, oral mucosa moist without lesions  Neck: supple, no thyromegaly, JVD or lymphadenopathy  Cardiac: RRR without murmurs, S1S2 heard, no peripheral edema  Pulm: clear to auscultation bilaterally, normal RR and effort noted  Abdomen: soft, + epigastric tenderness over her rectus diastasis, with active bowel sounds. No guarding or palpable hepatosplenomegaly.  Skin; warm and dry, no jaundice or rash  Recent Labs:  CBC Latest Ref Rng & Units 09/27/2017 08/24/2017 08/08/2017  WBC 4.0 - 10.5 K/uL 3.9(L) 3.4(L) 3.7(L)  Hemoglobin 12.0 - 15.0 g/dL 10.7(L) 9.7(L) 9.3(L)  Hematocrit 36.0 - 46.0 % 33.4(L) 29.5(L) 27.6(L)  Platelets 150 - 400 K/uL 224 198.0 169.0   CMP Latest Ref Rng & Units 09/27/2017 09/12/2017 08/24/2017  Glucose 65 - 99 mg/dL 104(H) 119(H) 97  BUN 6 - 20 mg/dL 41(H) 46(H) 45(H)  Creatinine 0.44 - 1.00 mg/dL 1.88(H) 2.23(H) 2.20(H)  Sodium 135 - 145 mmol/L 138 140 141  Potassium 3.5 - 5.1 mmol/L 3.7 4.0 4.4  Chloride 101 - 111 mmol/L 98(L) 100 102  CO2 22 - 32 mmol/L 27 31 34(H)  Calcium 8.9 - 10.3 mg/dL 9.7 9.5 9.2  Total Protein 6.0 - 8.3 g/dL - - -  Total Bilirubin 0.2 - 1.2 mg/dL - - -  Alkaline Phos 39 - 117 U/L - - -  AST 0 - 37 U/L - - -  ALT 0 - 35 U/L - - -   Weight stable since last OV in Dec  Radiologic studies:  CT abdomen and pelvis 05/03/2017: Mild cardiomegaly, small hiatal hernia incompletely distended gallbladder, no gallstones seen.  Normal pancreas.  No thickening, distention or inflammation of the colon or small bowel.  Sigmoid diverticulosis noted. Incidental renal cysts.  @ASSESSMENTPLANBEGIN @ Assessment: Encounter Diagnoses  Name Primary?  . Abdominal  pain, chronic, epigastric Yes  . Rectus diastasis   . Anemia of chronic disease     At least part of her epigastric discomfort seems to be from the rectus diastasis.  She also is troubled by constipation that may be contributing as well.  To that end, I think she should be off oral iron for the next 2 weeks to see if there is any improvement in bowel habits and/or abdominal pain.  Really, it does not seem that once daily iron therapy has been very helpful to improve her anemia.  She is not able to tolerate more frequent dosing due to the GI upset.  It seems to me it needs reevaluation by hematology and  consideration of IV iron therapy and then perhaps Epogen by hematology or nephrology once her iron stores are replete.  She will take 1/2-1 whole capful a day of MiraLAX to relieve constipation, stop the iron temporarily as noted above, and I will copy my notes to primary care, hematology, and fax a copy to Dr. Hollie Salk at [facility] as well.  Dorsal come to see me as needed.   Total time 20 minutes, over half spent face-to-face with patient in counseling and coordination of care.   Nelida Meuse III

## 2017-11-01 ENCOUNTER — Telehealth: Payer: Self-pay

## 2017-11-01 NOTE — Telephone Encounter (Signed)
Per DR Loletha Carrow' request records form office visit 11-01-2017 have been faxed to Dr Hollie Salk at [facility].

## 2017-11-02 ENCOUNTER — Ambulatory Visit: Payer: Medicare Other | Admitting: Gastroenterology

## 2017-11-05 DIAGNOSIS — R55 Syncope and collapse: Secondary | ICD-10-CM | POA: Diagnosis not present

## 2017-11-05 DIAGNOSIS — Z86718 Personal history of other venous thrombosis and embolism: Secondary | ICD-10-CM | POA: Diagnosis not present

## 2017-11-05 DIAGNOSIS — N183 Chronic kidney disease, stage 3 (moderate): Secondary | ICD-10-CM | POA: Diagnosis not present

## 2017-11-05 DIAGNOSIS — D631 Anemia in chronic kidney disease: Secondary | ICD-10-CM | POA: Diagnosis not present

## 2017-11-05 DIAGNOSIS — I129 Hypertensive chronic kidney disease with stage 1 through stage 4 chronic kidney disease, or unspecified chronic kidney disease: Secondary | ICD-10-CM | POA: Diagnosis not present

## 2017-11-06 ENCOUNTER — Other Ambulatory Visit: Payer: Self-pay | Admitting: Nephrology

## 2017-11-06 DIAGNOSIS — D631 Anemia in chronic kidney disease: Secondary | ICD-10-CM

## 2017-11-06 DIAGNOSIS — R0683 Snoring: Secondary | ICD-10-CM | POA: Diagnosis not present

## 2017-11-06 DIAGNOSIS — R55 Syncope and collapse: Secondary | ICD-10-CM

## 2017-11-06 DIAGNOSIS — N183 Chronic kidney disease, stage 3 (moderate): Principal | ICD-10-CM

## 2017-11-06 DIAGNOSIS — Z86718 Personal history of other venous thrombosis and embolism: Secondary | ICD-10-CM

## 2017-11-06 DIAGNOSIS — N1831 Chronic kidney disease, stage 3a: Secondary | ICD-10-CM

## 2017-11-06 DIAGNOSIS — I129 Hypertensive chronic kidney disease with stage 1 through stage 4 chronic kidney disease, or unspecified chronic kidney disease: Secondary | ICD-10-CM

## 2017-11-06 DIAGNOSIS — N189 Chronic kidney disease, unspecified: Secondary | ICD-10-CM

## 2017-11-07 ENCOUNTER — Other Ambulatory Visit: Payer: Self-pay | Admitting: *Deleted

## 2017-11-07 ENCOUNTER — Other Ambulatory Visit: Payer: Self-pay | Admitting: Nephrology

## 2017-11-07 DIAGNOSIS — R400 Somnolence: Secondary | ICD-10-CM

## 2017-11-07 DIAGNOSIS — N281 Cyst of kidney, acquired: Secondary | ICD-10-CM | POA: Diagnosis not present

## 2017-11-07 DIAGNOSIS — R0683 Snoring: Secondary | ICD-10-CM | POA: Diagnosis not present

## 2017-11-07 DIAGNOSIS — D631 Anemia in chronic kidney disease: Secondary | ICD-10-CM

## 2017-11-07 DIAGNOSIS — R55 Syncope and collapse: Secondary | ICD-10-CM

## 2017-11-07 DIAGNOSIS — N189 Chronic kidney disease, unspecified: Secondary | ICD-10-CM

## 2017-11-07 DIAGNOSIS — N1831 Chronic kidney disease, stage 3a: Secondary | ICD-10-CM

## 2017-11-07 DIAGNOSIS — I150 Renovascular hypertension: Secondary | ICD-10-CM | POA: Diagnosis not present

## 2017-11-07 DIAGNOSIS — I129 Hypertensive chronic kidney disease with stage 1 through stage 4 chronic kidney disease, or unspecified chronic kidney disease: Secondary | ICD-10-CM

## 2017-11-07 DIAGNOSIS — Z86718 Personal history of other venous thrombosis and embolism: Secondary | ICD-10-CM

## 2017-11-07 DIAGNOSIS — N183 Chronic kidney disease, stage 3 (moderate): Principal | ICD-10-CM

## 2017-11-07 NOTE — Telephone Encounter (Signed)
Have received it  And will fill out  within the next week  Or so

## 2017-11-07 NOTE — Telephone Encounter (Signed)
Pt called to follow up on dmv papers. Please advise

## 2017-11-08 ENCOUNTER — Other Ambulatory Visit: Payer: Self-pay | Admitting: Internal Medicine

## 2017-11-12 ENCOUNTER — Other Ambulatory Visit: Payer: Self-pay | Admitting: Internal Medicine

## 2017-11-12 ENCOUNTER — Telehealth: Payer: Self-pay | Admitting: Pulmonary Disease

## 2017-11-12 ENCOUNTER — Telehealth: Payer: Self-pay | Admitting: Pharmacy Technician

## 2017-11-12 NOTE — Telephone Encounter (Signed)
Called and spoke to patient. Let her know that we don't have any results in but will notify her when we do. Patient verbalized understanding. Nothing further is needed at this time.

## 2017-11-13 ENCOUNTER — Other Ambulatory Visit: Payer: Self-pay | Admitting: Internal Medicine

## 2017-11-13 NOTE — Telephone Encounter (Signed)
Can you refill this?

## 2017-11-14 NOTE — Telephone Encounter (Signed)
Message duplicated. Nothing further needed.

## 2017-11-14 NOTE — Telephone Encounter (Signed)
This is a duplicate, see previous phone note.

## 2017-11-14 NOTE — Telephone Encounter (Signed)
The provider has the forms, will ask about this.

## 2017-11-14 NOTE — Telephone Encounter (Signed)
Form is done but she needs to fill put her part before sending in    apologize for tardiness of forms but was out of office for some days . I have advised no driving for 6 months OR  until  Cardiology   oks driving and evaluation complete  Earlier .

## 2017-11-14 NOTE — Telephone Encounter (Signed)
Copied from [place] [id number] [id number]. Topic: General - Other >> Nov 14, 2017 11:17 AM Yvette Rack wrote: Reason for CRM: Pt asking for the status of the [facility] paperwork. Pt requests a return call. Cb# [phone]

## 2017-11-16 NOTE — Telephone Encounter (Signed)
Rx/forms faxed. Fax confirmation received.  

## 2017-11-16 NOTE — Telephone Encounter (Signed)
Pt notified of results/instructions and verbalized understanding. She will come to office today and sign her portion and we will fax forms for her. There are a few additional signatures needed from Dr. Regis Bill, then forms will be filed at front desk for pick up.

## 2017-11-19 ENCOUNTER — Encounter: Payer: Self-pay | Admitting: Physician Assistant

## 2017-11-19 ENCOUNTER — Ambulatory Visit: Payer: Medicare Other | Admitting: Physician Assistant

## 2017-11-19 VITALS — BP 166/62 | HR 52 | Ht 60.0 in | Wt 169.0 lb

## 2017-11-19 DIAGNOSIS — R55 Syncope and collapse: Secondary | ICD-10-CM

## 2017-11-19 DIAGNOSIS — R001 Bradycardia, unspecified: Secondary | ICD-10-CM

## 2017-11-19 DIAGNOSIS — I272 Pulmonary hypertension, unspecified: Secondary | ICD-10-CM

## 2017-11-19 DIAGNOSIS — N184 Chronic kidney disease, stage 4 (severe): Secondary | ICD-10-CM | POA: Diagnosis not present

## 2017-11-19 DIAGNOSIS — I10 Essential (primary) hypertension: Secondary | ICD-10-CM

## 2017-11-19 NOTE — Patient Instructions (Signed)
Medication Instructions:  Your physician recommends that you continue on your current medications as directed. Please refer to the Current Medication list given to you today.   Labwork: NONE ORDERED TODAY  Testing/Procedures: Your physician has recommended that you wear an event monitor. Event monitors are medical devices that record the heart's electrical activity. Doctors most often [place] these monitors to diagnose arrhythmias. Arrhythmias are problems with the speed or rhythm of the heartbeat. The monitor is a small, portable device. You can wear one while you do your normal daily activities. This is usually used to diagnose what is causing palpitations/syncope (passing out).    Follow-Up: DR. Acie Fredrickson IN ABOUT 8 WEEKS;   Any Other Special Instructions Will Be Listed Below (If Applicable).     If you need a refill on your cardiac medications before your next appointment, please call your pharmacy.
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
## 2017-11-21 ENCOUNTER — Telehealth: Payer: Self-pay | Admitting: Pulmonary Disease

## 2017-11-21 NOTE — Telephone Encounter (Signed)
Called and spoke to pt.  Pt is requesting HST results from 11/07/17.  BQ please advise on results. Thanks

## 2017-11-22 NOTE — Telephone Encounter (Signed)
Called and spoke with patient, advised patient that it could take a few weeks to get these results back. Patient verbalized understanding. Will call patient once we have results. Nothing further needed.

## 2017-11-22 NOTE — Telephone Encounter (Signed)
I also eagerly await these results.  Not complete yet.

## 2017-11-23 ENCOUNTER — Ambulatory Visit
Admission: RE | Admit: 2017-11-23 | Discharge: 2017-11-23 | Disposition: A | Discharge status: Home / Self Care | Payer: Medicare Other | Source: Ambulatory Visit | Attending: Nephrology | Admitting: Nephrology

## 2017-11-23 DIAGNOSIS — I150 Renovascular hypertension: Secondary | ICD-10-CM | POA: Diagnosis not present

## 2017-11-23 DIAGNOSIS — N189 Chronic kidney disease, unspecified: Secondary | ICD-10-CM

## 2017-11-23 DIAGNOSIS — N1831 Chronic kidney disease, stage 3a: Secondary | ICD-10-CM

## 2017-11-23 DIAGNOSIS — N183 Chronic kidney disease, stage 3 (moderate): Principal | ICD-10-CM

## 2017-11-23 DIAGNOSIS — D631 Anemia in chronic kidney disease: Secondary | ICD-10-CM

## 2017-11-23 DIAGNOSIS — R55 Syncope and collapse: Secondary | ICD-10-CM

## 2017-11-23 DIAGNOSIS — I129 Hypertensive chronic kidney disease with stage 1 through stage 4 chronic kidney disease, or unspecified chronic kidney disease: Secondary | ICD-10-CM

## 2017-11-23 DIAGNOSIS — Z86718 Personal history of other venous thrombosis and embolism: Secondary | ICD-10-CM

## 2017-11-23 DIAGNOSIS — N281 Cyst of kidney, acquired: Secondary | ICD-10-CM | POA: Diagnosis not present

## 2017-11-26 ENCOUNTER — Ambulatory Visit (INDEPENDENT_AMBULATORY_CARE_PROVIDER_SITE_OTHER): Payer: Medicare Other | Admitting: Pulmonary Disease

## 2017-11-26 ENCOUNTER — Encounter: Payer: Self-pay | Admitting: Pulmonary Disease

## 2017-11-26 ENCOUNTER — Ambulatory Visit: Payer: Medicare Other | Admitting: Internal Medicine

## 2017-11-26 ENCOUNTER — Ambulatory Visit (INDEPENDENT_AMBULATORY_CARE_PROVIDER_SITE_OTHER): Payer: Medicare Other | Admitting: *Deleted

## 2017-11-26 VITALS — BP 160/70 | HR 58 | Ht 61.5 in | Wt 168.0 lb

## 2017-11-26 DIAGNOSIS — R0600 Dyspnea, unspecified: Secondary | ICD-10-CM | POA: Diagnosis not present

## 2017-11-26 DIAGNOSIS — I2729 Other secondary pulmonary hypertension: Secondary | ICD-10-CM

## 2017-11-26 DIAGNOSIS — J45909 Unspecified asthma, uncomplicated: Secondary | ICD-10-CM

## 2017-11-26 DIAGNOSIS — Z86718 Personal history of other venous thrombosis and embolism: Secondary | ICD-10-CM

## 2017-11-26 DIAGNOSIS — I272 Pulmonary hypertension, unspecified: Secondary | ICD-10-CM

## 2017-11-26 LAB — PULMONARY FUNCTION TEST
DL/VA % pred: 93 %
DL/VA: 4.17 ml/min/mmHg/L
DLCO COR % PRED: 64 %
DLCO COR: 13.55 ml/min/mmHg
DLCO UNC % PRED: 58 %
DLCO UNC: 12.27 ml/min/mmHg
FEF 25-75 Post: 0.66 L/sec
FEF 25-75 Pre: 0.46 L/sec
FEF2575-%Change-Post: 44 %
FEF2575-%Pred-Post: 61 %
FEF2575-%Pred-Pre: 42 %
FEV1-%CHANGE-POST: 11 %
FEV1-%PRED-POST: 73 %
FEV1-%Pred-Pre: 66 %
FEV1-POST: 0.95 L
FEV1-Pre: 0.85 L
FEV1FVC-%Change-Post: 7 %
FEV1FVC-%Pred-Pre: 86 %
FEV6-%Change-Post: 4 %
FEV6-%PRED-POST: 85 %
FEV6-%PRED-PRE: 82 %
FEV6-Post: 1.36 L
FEV6-Pre: 1.31 L
FEV6FVC-%CHANGE-POST: 0 %
FEV6FVC-%PRED-POST: 105 %
FEV6FVC-%PRED-PRE: 104 %
FVC-%Change-Post: 3 %
FVC-%PRED-PRE: 78 %
FVC-%Pred-Post: 80 %
FVC-POST: 1.36 L
FVC-PRE: 1.31 L
POST FEV6/FVC RATIO: 100 %
PRE FEV6/FVC RATIO: 100 %
Post FEV1/FVC ratio: 70 %
Pre FEV1/FVC ratio: 65 %

## 2017-11-26 NOTE — Progress Notes (Signed)
PFT done today. 

## 2017-11-26 NOTE — Progress Notes (Signed)
SIX MIN WALK 11/26/2017  Medications BP medication taken at 8:00 this morning  Supplimental Oxygen during Test? (L/min) No  Laps 5  Baseline BP (sitting) 120/82  Baseline Heartrate 60  Baseline Dyspnea (Borg Scale) 0  Baseline Fatigue (Borg Scale) 0  Baseline SPO2 98  BP (sitting) 138/80  Heartrate 55  Dyspnea (Borg Scale) 6  Fatigue (Borg Scale) 7  SPO2 100  BP (sitting) 134/78  Heartrate 52  SPO2 99  Stopped or Paused before Six Minutes No  Tech Comments: patient completed all laps with no complaints.

## 2017-11-26 NOTE — Patient Instructions (Signed)
Daytime sleepiness: I am waiting the results of your sleep study, I will call you when I hear report  Moderate persistent asthma: Today's lung function test confirmed that you have airflow obstruction from asthma Keep taking Advair twice a day as you are doing Use albuterol every 4-6 hours as needed for chest tightness wheezing or shortness of breath If you are having to use your albuterol more frequently because of shortness of breath or other respiratory symptoms these call us to let us know so that we can evaluate you here in our clinic Get a flu shot in the fall Stay active Practice good hand hygiene  Pulmonary hypertension: This term means that you have high blood pressure in the lungs.  At this time it seems that this is due to your asthma and there is nothing different for [place] to do.  If you report new leg swelling or worsening shortness of breath please let us know  We will see you back in 3 months or sooner if needed

## 2017-11-26 NOTE — Progress Notes (Signed)
Synopsis: Referred in May 2019 for Pulmonary Hypertension; she has a history of DVT  Subjective:   PATIENT ID: [name] GENDER: female DOB: 11/30/1933, MRN: [id number]   HPI  Chief Complaint  Patient presents with  . Follow-up    PFT today   [name] reports that she is feeling better.  She says that she uses albuterol about once a day, she is typically done this for many years.  It helps relieve the sensation of chest tightness and shortness of breath.  It definitely helps her breathe better.  She continues to take Advair twice a day.  She had her lung function test today.  She is here today to go over the results of her lung function test and all the other testing she has had recently.   Past Medical History:  Diagnosis Date  . Allergy   . Anemia   . Anxiety   . Arthritis    "shoulders" (09/09/2012)  . Asthma 09/08/2012  . Diabetes mellitus without complication ([place])    "Borderline" per pt; being monitored.  no meds per pt  . Dyspnea 04/27/2014  . GERD (gastroesophageal reflux disease)   . Headache(784.0)    "related to my high blood pressure" (09/09/2012)  . History of DVT (deep vein thrombosis)    "RLE" (09/09/2012) coumadine cant take asa so on plavix   . HTN (hypertension) 09/08/2012  . Hyperlipidemia   . Lupus ([place])    "cured years ago" (09/09/2012)  . Other dysphagia 02/11/2013  . Varicose veins   . Varicose veins of leg with complications 2/48/2500      Review of Systems  Constitutional: Positive for malaise/fatigue. Negative for chills, fever and weight loss.  HENT: Negative for congestion, nosebleeds, sinus pain and sore throat.   Eyes: Negative for photophobia, pain and discharge.  Respiratory: Positive for cough, shortness of breath and wheezing. Negative for hemoptysis and sputum production.   Cardiovascular: Negative for chest pain, palpitations, orthopnea and leg swelling.  Gastrointestinal: Negative for abdominal pain, constipation, diarrhea,  nausea and vomiting.  Genitourinary: Negative for dysuria, frequency, hematuria and urgency.  Musculoskeletal: Negative for back pain, joint pain, myalgias and neck pain.  Skin: Negative for itching and rash.  Neurological: Negative for tingling, tremors, sensory change, speech change, focal weakness, seizures, weakness and headaches.  Psychiatric/Behavioral: Negative for memory loss, substance abuse and suicidal ideas. The patient is not nervous/anxious.       Objective:  Physical Exam   Vitals:   11/26/17 1110  BP: (!) 160/70  Pulse: (!) 58  SpO2: 96%  Weight: 168 lb (76.2 kg)  Height: 5' 1.5" (1.562 m)   RA  Gen: well appearing HENT: OP clear, TM's clear, neck supple PULM: CTA B, normal percussion CV: RRR, no mgr, trace edema GI: BS+, soft, nontender Derm: no cyanosis or rash Psyche: normal mood and affect    CBC    Component Value Date/Time   WBC 3.9 (L) 09/27/2017 1727   RBC 3.81 (L) 09/27/2017 1727   HGB 10.7 (L) 09/27/2017 1727   HGB 10.6 (L) 06/19/2016 1301   HCT 33.4 (L) 09/27/2017 1727   HCT 32.9 (L) 06/19/2016 1301   PLT 224 09/27/2017 1727   PLT 194 06/19/2016 1301   MCV 87.7 09/27/2017 1727   MCV 86.6 06/19/2016 1301   MCH 28.1 09/27/2017 1727   MCHC 32.0 09/27/2017 1727   RDW 13.1 09/27/2017 1727   RDW 13.6 06/19/2016 1301   LYMPHSABS 1.4 09/27/2017 1727  LYMPHSABS 0.9 06/19/2016 1301   MONOABS 0.2 09/27/2017 1727   MONOABS 0.4 06/19/2016 1301   EOSABS 0.1 09/27/2017 1727   EOSABS 0.1 06/19/2016 1301   BASOSABS 0.0 09/27/2017 1727   BASOSABS 0.0 06/19/2016 1301     Chest imaging: March 2019 chest x-ray images independently reviewed showing cardiomegaly, normal pulmonary parenchyma June 2019 VQ scan low probability for pulmonary embolism  PFT: July 2019 pulmonary function testing ratio 65%, FEV1 improved from 0.5 L 66% predicted to 0.95 L 73% predicted postbronchodilator, small airway obstruction noted, total lung capacity 3.1 L 65%  predicted, ERV 0.09, DLCO 12.3 mL 58% predicted  Labs: February 2019 TSH within normal limits  Path:  Echo: 09/2017 Echo: LVEF 60-65%, LAE noted, RVSP 45 mmHg  Heart Catheterization:       Assessment & Plan:   Pulmonary hypertension, low resistance (HCC) - Plan: 6 minute walk  Dyspnea, unspecified type  Other secondary pulmonary hypertension (HCC)  Chronic asthma without complication, unspecified asthma severity, unspecified whether persistent  History of DVT (deep vein thrombosis)  Discussion: This has been a stable interval for [name].  She has not had any exacerbations of her asthma.  Further, VQ scanning did not show evidence of chronic thromboembolic pulmonary hypertension.  I am still awaiting the results of her sleep study.  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  Plan: Daytime sleepiness: I am waiting the results of your sleep study, I will call you when I hear report  Moderate persistent asthma: Today's lung function test confirmed that you have airflow obstruction from asthma Keep taking Advair twice a day as you are doing Use albuterol every 4-6 hours as needed for chest tightness wheezing or shortness of breath If you are having to use your albuterol more frequently because of shortness of breath or other respiratory symptoms these call us to let us know so that we can evaluate you here in our clinic Get a flu shot in the fall Stay active Practice good hand hygiene  Pulmonary hypertension: This term means that you have high blood pressure in the lungs.  At this time it seems that this is due to your asthma and there is nothing different for [place] to do.  If you report new leg swelling or worsening shortness of breath please let us know  We will see you back in 3 months or  sooner if needed  > 50% of this 25 min visit spent with the patient.   Current Outpatient Medications:  .  acetaminophen (TYLENOL) 500 MG tablet, Take 1,000 mg by mouth every 6 (six) hours as needed for moderate pain. Reported on 04/30/2015, Disp: , Rfl:  .  albuterol (PROVENTIL) (2.5 MG/3ML) 0.083% nebulizer solution, USE 1 AMPULE EVERY 6 HOURS AS NEEDED FOR WHEEZE, Disp: 75 mL, Rfl: 0 .  amLODipine-olmesartan (AZOR) 10-40 MG tablet, Take 1 tablet by mouth daily., Disp: , Rfl:  .  carvedilol (COREG) 12.5 MG tablet, Take 12.5 mg by mouth 2 (two) times daily with a meal., Disp: , Rfl:  .  citalopram (CELEXA) 20 MG tablet, Take 1 tablet (20 mg total) by mouth daily., Disp: 30 tablet, Rfl: 5 .  clopidogrel (PLAVIX) 75 MG tablet, TAKE 1 TABLET ONCE DAILY., Disp: 30 tablet, Rfl: 2 .  diclofenac sodium (VOLTAREN) 1 % GEL, APPLY 4GM TO AFFECTED AREA(S) 4  TIMES A DAY., Disp: 100 g, Rfl: 0 .  doxazosin (CARDURA) 2 MG tablet, TAKE 3 TABLETS AT BEDTIME., Disp: 90 tablet, Rfl: 0 .  fenofibrate 160 MG tablet, TAKE 1 TABLET ONCE DAILY., Disp: 90 tablet, Rfl: 0 .  ferrous sulfate 325 (65 FE) MG EC tablet, Take 1 tablet (325 mg total) by mouth daily with breakfast., Disp: 30 tablet, Rfl: 3 .  fluorometholone (FML) 0.1 % ophthalmic suspension, Place 1 drop into both eyes 2 (two) times daily. , Disp: , Rfl:  .  Fluticasone-Salmeterol (ADVAIR) 250-50 MCG/DOSE AEPB, USE 1 PUFF TWICE DAILY., Disp: 60 each, Rfl: 3 .  furosemide (LASIX) 80 MG tablet, Take 80 mg by mouth daily., Disp: , Rfl:  .  lidocaine (LIDODERM) 5 %, Place 1 patch onto the skin daily. Remove & Discard patch within 12 hours or as directed by MD (Patient taking differently: Place 1 patch onto the skin daily as needed (pain). Remove & Discard patch within 12 hours or as directed by MD), Disp: 30 patch, Rfl: 0 .  LORazepam (ATIVAN) 1 MG tablet, Take 1 tablet (1 mg total) by mouth every 8 (eight) hours as needed for anxiety. Avoid regular use, Disp: 24  tablet, Rfl: 0 .  montelukast (SINGULAIR) 10 MG tablet, TAKE 1 TABLET ONCE DAILY., Disp: 90 tablet, Rfl: 0 .  pantoprazole (PROTONIX) 40 MG tablet, TAKE (1) TABLET TWICE A DAY BEFORE MEALS., Disp: 60 tablet, Rfl: 0 .  potassium chloride (K-DUR,KLOR-CON) 10 MEQ tablet, Take 10 mEq by mouth 4 (four) times daily., Disp: , Rfl:  .  PROAIR HFA 108 (90 Base) MCG/ACT inhaler, USE 2 PUFFS EVERY 6 HOURS AS NEEDED FOR WHEEZING., Disp: 8.5 g, Rfl: 0 .  RESTASIS 0.05 % ophthalmic emulsion, Place 1 drop into both eyes 2 (two) times daily. , Disp: , Rfl:  .  Spacer/Aero-Holding Chambers (AEROCHAMBER PLUS) inhaler, Use as instructed, Disp: 1 each, Rfl: 2 .  vitamin B-12 (CYANOCOBALAMIN) 1000 MCG tablet, Take 1,000 mcg by mouth daily., Disp: , Rfl:

## 2017-11-27 ENCOUNTER — Ambulatory Visit (INDEPENDENT_AMBULATORY_CARE_PROVIDER_SITE_OTHER): Payer: Medicare Other

## 2017-11-27 DIAGNOSIS — R55 Syncope and collapse: Secondary | ICD-10-CM | POA: Diagnosis not present

## 2017-11-27 NOTE — Telephone Encounter (Signed)
Patient received a letter in the mail that her claim was denied because medical forms were not received. The letter was dated for July 5, same day as forms were faxed. I called [facility] and they confirmed that they did receive the paperwork and her case is under review. The review process can take up to 6 weeks.   Pt notified of the above and verbalized understanding. Paperwork filed at front desk for patient to pick up at her appointment on Friday. Copies sent for scanning.

## 2017-11-30 ENCOUNTER — Encounter: Payer: Self-pay | Admitting: Internal Medicine

## 2017-11-30 ENCOUNTER — Ambulatory Visit (INDEPENDENT_AMBULATORY_CARE_PROVIDER_SITE_OTHER): Payer: Medicare Other | Admitting: Internal Medicine

## 2017-11-30 VITALS — BP 157/64 | HR 53 | Temp 98.5°F | Wt 169.0 lb

## 2017-11-30 DIAGNOSIS — R7301 Impaired fasting glucose: Secondary | ICD-10-CM | POA: Diagnosis not present

## 2017-11-30 DIAGNOSIS — I1310 Hypertensive heart and chronic kidney disease without heart failure, with stage 1 through stage 4 chronic kidney disease, or unspecified chronic kidney disease: Secondary | ICD-10-CM | POA: Diagnosis not present

## 2017-11-30 DIAGNOSIS — Z79899 Other long term (current) drug therapy: Secondary | ICD-10-CM

## 2017-11-30 DIAGNOSIS — J45909 Unspecified asthma, uncomplicated: Secondary | ICD-10-CM | POA: Diagnosis not present

## 2017-11-30 DIAGNOSIS — D638 Anemia in other chronic diseases classified elsewhere: Secondary | ICD-10-CM | POA: Diagnosis not present

## 2017-11-30 DIAGNOSIS — R55 Syncope and collapse: Secondary | ICD-10-CM

## 2017-11-30 DIAGNOSIS — I10 Essential (primary) hypertension: Secondary | ICD-10-CM | POA: Diagnosis not present
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
## 2017-11-30 NOTE — Patient Instructions (Addendum)
Try adding albuterol before bed    And if progressing   Contact the pulmonary  department   Plan asking dr Hollie Salk  About help with BP medications. ( and cardiology )  Also the anemia may be anemia of chronic disease related to kidney.  Plan  FU [name] in 2-3  Months o ras needed.   No driving for full 6 months  From event and until cleared .  But consider other optinos

## 2017-12-02 ENCOUNTER — Encounter: Payer: Self-pay | Admitting: Internal Medicine

## 2017-12-03 DIAGNOSIS — N183 Chronic kidney disease, stage 3 (moderate): Secondary | ICD-10-CM | POA: Diagnosis not present

## 2017-12-03 DIAGNOSIS — R55 Syncope and collapse: Secondary | ICD-10-CM | POA: Diagnosis not present

## 2017-12-03 DIAGNOSIS — I129 Hypertensive chronic kidney disease with stage 1 through stage 4 chronic kidney disease, or unspecified chronic kidney disease: Secondary | ICD-10-CM | POA: Diagnosis not present

## 2017-12-03 LAB — BASIC METABOLIC PANEL
BUN: 47 — AB (ref 4–21)
CREATININE: 2.1 — AB (ref <=1.1)
Glucose: 115
SODIUM: 138 (ref 137–147)

## 2017-12-07 ENCOUNTER — Encounter: Payer: Self-pay | Admitting: Internal Medicine

## 2017-12-07 ENCOUNTER — Ambulatory Visit (INDEPENDENT_AMBULATORY_CARE_PROVIDER_SITE_OTHER): Payer: Medicare Other | Admitting: Internal Medicine

## 2017-12-07 VITALS — BP 172/62 | HR 58 | Temp 98.2°F | Wt 166.7 lb

## 2017-12-07 DIAGNOSIS — I10 Essential (primary) hypertension: Secondary | ICD-10-CM

## 2017-12-07 DIAGNOSIS — Z79899 Other long term (current) drug therapy: Secondary | ICD-10-CM | POA: Diagnosis not present

## 2017-12-07 DIAGNOSIS — N183 Chronic kidney disease, stage 3 unspecified: Secondary | ICD-10-CM
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
## 2017-12-07 NOTE — Patient Instructions (Signed)
Will write letter as discussed  I agree that  OT evaluation  not necessary at this time and the problems defined.   Hydralazine   seems like a good idea to transition to .      Some medication can cause  Orthostatic changes  And some can lower pulse .

## 2017-12-13 ENCOUNTER — Other Ambulatory Visit: Payer: Self-pay | Admitting: Internal Medicine

## 2017-12-17 ENCOUNTER — Telehealth: Payer: Self-pay

## 2017-12-17 NOTE — Telephone Encounter (Signed)
I agree with continued monitoring.  No further intervention recommended at this time.  Nelva Bush, MD [facility] Pager: [phone]

## 2017-12-17 NOTE — Telephone Encounter (Signed)
Left Message for return call for abnormal monitor strip.

## 2017-12-17 NOTE — Telephone Encounter (Signed)
Pt had a run of VT (5 beats) around 3:30 am. Pt states she was sleeping and had no symptoms. Per Dr Rayann Heman, no new orders and let Dr Burt Knack know of the incident.

## 2017-12-25 ENCOUNTER — Other Ambulatory Visit: Payer: Self-pay | Admitting: Internal Medicine

## 2017-12-27 ENCOUNTER — Encounter: Payer: Self-pay | Admitting: Cardiovascular Disease

## 2018-01-01 ENCOUNTER — Encounter: Payer: Self-pay | Admitting: Internal Medicine

## 2018-01-09 DIAGNOSIS — M109 Gout, unspecified: Secondary | ICD-10-CM | POA: Diagnosis not present

## 2018-01-09 DIAGNOSIS — N183 Chronic kidney disease, stage 3 (moderate): Secondary | ICD-10-CM | POA: Diagnosis not present

## 2018-01-09 DIAGNOSIS — M329 Systemic lupus erythematosus, unspecified: Secondary | ICD-10-CM | POA: Diagnosis not present

## 2018-01-09 DIAGNOSIS — D631 Anemia in chronic kidney disease: Secondary | ICD-10-CM | POA: Diagnosis not present

## 2018-01-09 DIAGNOSIS — I129 Hypertensive chronic kidney disease with stage 1 through stage 4 chronic kidney disease, or unspecified chronic kidney disease: Secondary | ICD-10-CM | POA: Diagnosis not present

## 2018-01-21 ENCOUNTER — Encounter: Payer: Self-pay | Admitting: Cardiovascular Disease

## 2018-01-21 ENCOUNTER — Ambulatory Visit: Payer: Medicare Other | Admitting: Cardiovascular Disease

## 2018-01-21 VITALS — BP 160/70 | HR 57 | Ht 61.5 in | Wt 172.4 lb

## 2018-01-21 DIAGNOSIS — I10 Essential (primary) hypertension: Secondary | ICD-10-CM

## 2018-01-21 DIAGNOSIS — R55 Syncope and collapse: Secondary | ICD-10-CM

## 2018-01-21 HISTORY — DX: Syncope and collapse: R55

## 2018-01-21 MED ORDER — HYDRALAZINE HCL 25 MG PO TABS: 25.0000 mg | ORAL_TABLET | Freq: Three times a day (TID) | ORAL | 3 refills | Status: DC

## 2018-01-21 NOTE — Patient Instructions (Signed)
Medication Instructions:  Your physician has recommended you make the following change in your medication:   INCREASE Hydralazine to 25 mg 3 times per day   Labwork: None Ordered   Testing/Procedures: None Ordered   Follow-Up: Your physician wants you to follow-up in: 6 months with Richardson Dopp, PA.  You will receive a reminder letter in the mail two months in advance. If you don't receive a letter, please call our office to schedule the follow-up appointment.   If you need a refill on your cardiac medications before your next appointment, please call your pharmacy.   Thank you for choosing [facility]! Christen Bame, RN [phone]

## 2018-01-21 NOTE — Progress Notes (Signed)
Cardiology    Patient ID: [name] MRN: [id number]; DOB: Nov 17, 1933    Primary Care Provider: Burnis Medin, MD Primary Cardiologist: Mertie Moores, MD  Primary Electrophysiologist:  None   Problem list 1.  Hypertension 2.  Hyperlipidemia 3.  Prior DVT 4.  Lupus 5.  Chronic kidney disease 6.  Syncope 7.  Leg edema   Chief Complaint:  Syncope   Patient Profile:   [name] is a 84 y.o. female with history of hypertension and hyperlipidemia.  She was recently seen by Richardson Dopp, PA for an episode of syncope.  Seen in the past for an episode of leg swelling in 2015.  History of Present Illness:   [name] is seen for further evaluation following her episode of syncope in July.  During that time, her metoprolol had just been changed to carvedilol.   Echocardiogram revealed normal left ventricular systolic function.  She has moderate pulmonary hypertension with an estimated PA pressure of 45. V Q scan was low risk for pulmonary embolus. She had a 30 day monitor that revealed normal sinus rhythm and sinus bradycardia.  She had one 5 beat run of ventricular ectopy which was not significant.  No further episodes of syncope  BP is still elevated.   Still eats some salty foods.    Past Medical History:  Diagnosis Date  . Acute superficial venous thrombosis of left lower extremity 03/27/2014   concern about hx and potential extensive on exam tenderness calf and low dose lovenox 40 qd 2-4 weekselevetion and close  fu advised .   [name] Allergy   . Anemia   . Anxiety   . Arthritis    "shoulders" (09/09/2012)  . Asthma 09/08/2012  . Diabetes mellitus without complication ([place])    "Borderline" per pt; being monitored.  no meds per pt  . Dyspnea 04/27/2014  . GERD (gastroesophageal reflux disease)   . Headache(784.0)    "related to my high blood pressure" (09/09/2012)  . History of DVT (deep vein thrombosis)    "RLE" (09/09/2012) coumadine cant take asa so on  plavix   . HTN (hypertension) 09/08/2012  . Hyperlipidemia   . Lupus ([place])    "cured years ago" (09/09/2012)  . Other dysphagia 02/11/2013  . Varicose veins   . Varicose veins of leg with complications 7/98/9211    Past Surgical History:  Procedure Laterality Date  . ABDOMINAL HYSTERECTOMY     partial  . BREAST EXCISIONAL BIOPSY Right   . BREAST SURGERY    . CARPAL TUNNEL RELEASE Right 2000's  . CARPAL TUNNEL RELEASE Bilateral 10/21/2013   Procedure: BILATERAL  CARPAL TUNNEL RELEASE;  Surgeon: [name], MD;  Location: [place];  Service: Orthopedics;  Laterality: Bilateral;  . SHOULDER OPEN ROTATOR CUFF REPAIR Bilateral 2000's  . TRIGGER FINGER RELEASE Right 10/21/2013   Procedure: RELEASE TRIGGER FINGER/A-1 PULLEY RIGHT MIDDLE AND RIGHT RING;  Surgeon: [name], MD;  Location: [place];  Service: Orthopedics;  Laterality: Right;     Medications Prior to Admission: Prior to Admission medications   Medication Sig Start Date End Date Taking? Authorizing Provider  acetaminophen (TYLENOL) 500 MG tablet Take 1,000 mg by mouth every 6 (six) hours as needed for moderate pain. Reported on 04/30/2015   Yes [provider]  albuterol (PROVENTIL) (2.5 MG/3ML) 0.083% nebulizer solution USE 1 AMPULE EVERY 6 HOURS AS NEEDED FOR WHEEZE 10/23/17  Yes Panosh, Standley Brooking, MD  amLODipine-olmesartan (AZOR) 10-40  MG tablet Take 1 tablet by mouth daily.   Yes Madelon Lips, MD  carvedilol (COREG) 12.5 MG tablet Take 6.25 mg by mouth daily. 1/2 tablet daily   Yes Madelon Lips, MD  citalopram (CELEXA) 20 MG tablet TAKE 1 TABLET ONCE DAILY. 12/14/17  Yes Panosh, Standley Brooking, MD  clopidogrel (PLAVIX) 75 MG tablet TAKE 1 TABLET ONCE DAILY. 10/19/17  Yes Panosh, Standley Brooking, MD  diclofenac sodium (VOLTAREN) 1 % GEL APPLY 4GM TO AFFECTED AREA(S) 4 TIMES A DAY. 07/13/17  Yes Panosh, Standley Brooking, MD  doxazosin (CARDURA) 2 MG tablet TAKE 3 TABLETS AT BEDTIME. 12/13/17  Yes Panosh, Standley Brooking, MD  fenofibrate 160 MG tablet TAKE 1 TABLET ONCE DAILY. 12/13/17  Yes Panosh, Standley Brooking, MD  ferrous sulfate 325 (65 FE) MG EC tablet Take 1 tablet (325 mg total) by mouth daily with breakfast. 12/15/14  Yes Panosh, Standley Brooking, MD  fluorometholone (FML) 0.1 % ophthalmic suspension Place 1 drop into both eyes 2 (two) times daily.  09/13/17  Yes [provider]  Fluticasone-Salmeterol (ADVAIR) 250-50 MCG/DOSE AEPB USE 1 PUFF TWICE DAILY. 11/09/17  Yes Panosh, Standley Brooking, MD  furosemide (LASIX) 80 MG tablet Take 80 mg by mouth daily.   Yes Madelon Lips, MD  hydrALAZINE (APRESOLINE) 25 MG tablet Take 1 tablet by mouth daily. 12/03/17  Yes [provider]  lidocaine (LIDODERM) 5 % Place 1 patch onto the skin daily. Remove & Discard patch within 12 hours or as directed by MD Patient taking differently: Place 1 patch onto the skin daily as needed (pain). Remove & Discard patch within 12 hours or as directed by MD 01/04/17  Yes [place], Orson Ape, FNP  LORazepam (ATIVAN) 1 MG tablet Take 1 tablet (1 mg total) by mouth every 8 (eight) hours as needed for anxiety. Avoid regular use 08/28/17  Yes Panosh, Standley Brooking, MD  montelukast (SINGULAIR) 10 MG tablet TAKE 1 TABLET ONCE DAILY. 10/31/17  Yes Panosh, Standley Brooking, MD  pantoprazole (PROTONIX) 40 MG tablet TAKE (1) TABLET TWICE A DAY BEFORE MEALS. 10/23/17  Yes Panosh, Standley Brooking, MD  potassium chloride (K-DUR,KLOR-CON) 10 MEQ tablet Take 10 mEq by mouth 4 (four) times daily.   Yes [provider]  PROAIR HFA 108 (90 Base) MCG/ACT inhaler USE 2 PUFFS EVERY 6 HOURS AS NEEDED FOR WHEEZING. 12/13/17  Yes Panosh, Standley Brooking, MD  RESTASIS 0.05 % ophthalmic emulsion Place 1 drop into both eyes 2 (two) times daily.  01/01/15  Yes [provider]  Spacer/Aero-Holding Chambers (AEROCHAMBER PLUS) inhaler Use as instructed 05/26/16  Yes Melynda Ripple, MD  vitamin B-12 (CYANOCOBALAMIN) 1000 MCG tablet Take 1,000 mcg by mouth daily.   Yes [provider]       Allergies:    Allergies  Allergen Reactions  . Penicillins Nausea And Vomiting and Other (See Comments)    Has patient had a PCN reaction causing immediate rash, facial/tongue/throat swelling, SOB or lightheadedness with hypotension: Y Has patient had a PCN reaction causing severe rash involving mucus membranes or skin necrosis: Y Has patient had a PCN reaction that required hospitalization: N Has patient had a PCN reaction occurring within the last 10 years: N If all of the above answers are "NO", then may proceed with Cephalosporin use.   . Aspirin Other (See Comments)    Wheezing Acetaminophen is OK    Social History:   Social History   Socioeconomic History  . Marital status: Single    Spouse name:  Not on file  . Number of children: 6  . Years of education: Not on file  . Highest education level: Not on file  Occupational History  . Not on file  Social Needs  . Financial resource strain: Not on file  . Food insecurity:    Worry: Not on file    Inability: Not on file  . Transportation needs:    Medical: Not on file    Non-medical: Not on file  Tobacco Use  . Smoking status: Never Smoker  . Smokeless tobacco: Never Used  Substance and Sexual Activity  . Alcohol use: No  . Drug use: No  . Sexual activity: Not on file  Lifestyle  . Physical activity:    Days per week: Not on file    Minutes per session: Not on file  . Stress: Not on file  Relationships  . Social connections:    Talks on phone: Not on file    Gets together: Not on file    Attends religious service: Not on file    Active member of club or organization: Not on file    Attends meetings of clubs or organizations: Not on file    Relationship status: Not on file  . Intimate partner violence:    Fear of current or ex partner: Not on file    Emotionally abused: Not on file    Physically abused: Not on file    Forced sexual activity: Not on file  Other Topics Concern  . Not on file  Social  History Narrative   2 people living in the home.  Grand daughter.   Up and down through the night      Had 7 children  2 deceased . 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    works 3 days per week currently .   Glasses dentures  Neg tad     Family History:   The patient's family history includes Breast cancer in her brother, mother, paternal aunt, and sister; Cancer in her sister; Colon cancer in her sister; Esophageal cancer in her daughter; Heart attack in her daughter; Hypertension in her brother, father, and mother; Lung cancer in her unknown relative; Rectal cancer in her brother; Stomach cancer in her brother.    ROS:  Please see the history of present illness.  All other ROS reviewed and negative.     Physical Exam/Data:   Vitals:   01/21/18 0904  BP: (!) 160/70  Pulse: (!) 57  SpO2: 99%  Weight: 172 lb 6.4 oz (78.2 kg)  Height: 5' 1.5" (1.562 m)   @IOBRIEF @ [employer]   01/21/18 0904  Weight: 172 lb 6.4 oz (78.2 kg)   Body mass index is 32.05 kg/m.  General:   Elderly female ,  Chronically ill e HEENT: normal Lymph: no adenopathy Neck: no JVD Endocrine:  No thryomegaly Vascular: No carotid bruits; FA pulses 2+ bilaterally without bruits  Cardiac:  normal S1, S2; RRR; no murmur  Lungs:  clear to auscultation bilaterally, no wheezing, rhonchi or rales  Abd: soft, nontender, no hepatomegaly  Ext: no edema Musculoskeletal:  No deformities, BUE and BLE strength normal and equal Skin: warm and dry  Neuro:  CNs 2-12 intact, no focal abnormalities noted Psych:  Normal affect    EKG:    Relevant CV Studies:   Laboratory Data:  ChemistryNo results for input(s): NA, K,  CL, CO2, GLUCOSE, BUN, CREATININE, CALCIUM, GFRNONAA, GFRAA, ANIONGAP in the last 168 hours.  No results for input(s): PROT, ALBUMIN, AST, ALT, ALKPHOS, BILITOT in the last 168 hours. HematologyNo results for input(s):  WBC, RBC, HGB, HCT, MCV, MCH, MCHC, RDW, PLT in the last 168 hours. Cardiac EnzymesNo results for input(s): TROPONINI in the last 168 hours. No results for input(s): TROPIPOC in the last 168 hours.  BNPNo results for input(s): BNP, PROBNP in the last 168 hours.  DDimer No results for input(s): DDIMER in the last 168 hours.  Radiology/Studies:  No results found.  Assessment and Plan:   1.  Syncope:   Had a syncopal episode in May .   Should not drive until Nov.    33-IDH event monitor did not reveal any abnormalities that would cause syncope.  She had normal sinus rhythm and sinus bradycardia.  She had one nonsignificant 5 beat run of nonsustained ventricular tachycardia.    2.  Hypertension: She is on numerous medications.  She has stage III chronic kidney disease.  We will increase the hydralazine to 25 mg 3 times a day.  I advised her to watch out for hidden salts.  I have advised her not to eat out at restaurants.      Signed, Mertie Moores, MD  01/21/2018 9:20 AM

## 2018-01-23 DIAGNOSIS — I129 Hypertensive chronic kidney disease with stage 1 through stage 4 chronic kidney disease, or unspecified chronic kidney disease: Secondary | ICD-10-CM | POA: Diagnosis not present

## 2018-01-25 ENCOUNTER — Other Ambulatory Visit: Payer: Self-pay | Admitting: Internal Medicine

## 2018-01-30 NOTE — Progress Notes (Signed)
Chief Complaint  Patient presents with  . Medication Management    HPI: [name] 84 y.o. come in for Chronic disease management  No Has had recent evaluation for syncope loss of consciousness it was felt to be most likely from medication carvedilol and bradycardia. Since that time her carvedilol has been reduced to minimal 6.25 once a day with the hope of discontinuing altogether.  She is still taking Cardura 3 pills at night 6 mg. She has had a cardiovascular evaluation including a monitoring with no significant arrhythmias or bradycardia. She has had no recurrence of dizziness syncope. Respiratory stable does need refill of Advair and albuterol.  Has been seeing Dr. Hollie Salk she is now on maternity leave was given hydralazine to start in the interim clinician advised increasing to 100 mg 3 times daily which the pharmacist questioned at such a high dose to begin with.  Since that time she has been taking 25 3 times daily without side effects.  She states that her blood pressure readings at home are more in the 150 range but does not have the readings to hand in today. She needs a letter for [facility] to reinstitute her license when her 73-month.  Is up.  Discussed fenofibrate versus statin medicine.  She has been on medication for a while no side effects. Left leg still has soreness.  Felt to be possibly from pinched nerve plus her swelling remote history of DVT. ROS: See pertinent positives and negatives per HPI.  Past Medical History:  Diagnosis Date  . Acute superficial venous thrombosis of left lower extremity 03/27/2014   concern about hx and potential extensive on exam tenderness calf and low dose lovenox 40 qd 2-4 weekselevetion and close  fu advised .   [name] Allergy   . Anemia   . Anxiety   . Arthritis    "shoulders" (09/09/2012)  . Asthma 09/08/2012  . Diabetes mellitus without complication ([place])    "Borderline" per pt; being monitored.  no meds per pt  . Dyspnea 04/27/2014    . GERD (gastroesophageal reflux disease)   . Headache(784.0)    "related to my high blood pressure" (09/09/2012)  . History of DVT (deep vein thrombosis)    "RLE" (09/09/2012) coumadine cant take asa so on plavix   . HTN (hypertension) 09/08/2012  . Hyperlipidemia   . Lupus ([place])    "cured years ago" (09/09/2012)  . Other dysphagia 02/11/2013  . Varicose veins   . Varicose veins of leg with complications 3/97/6734    Family History  Problem Relation Age of Onset  . Hypertension Mother   . Breast cancer Mother   . Hypertension Father   . Cancer Sister        Colon and Breast Cancer  . Breast cancer Sister   . Colon cancer Sister        ? 31' s dx - died in 88's  . Hypertension Brother   . Rectal cancer Brother   . Stomach cancer Brother   . Breast cancer Brother   . Heart attack Daughter   . Esophageal cancer Daughter   . Lung cancer Unknown        both parents   . Breast cancer Paternal Aunt     Social History   Socioeconomic History  . Marital status: Single    Spouse name: Not on file  . Number of children: 6  . Years of education: Not on file  . Highest education level: Not on  file  Occupational History  . Not on file  Social Needs  . Financial resource strain: Not on file  . Food insecurity:    Worry: Not on file    Inability: Not on file  . Transportation needs:    Medical: Not on file    Non-medical: Not on file  Tobacco Use  . Smoking status: Never Smoker  . Smokeless tobacco: Never Used  Substance and Sexual Activity  . Alcohol use: No  . Drug use: No  . Sexual activity: Not on file  Lifestyle  . Physical activity:    Days per week: Not on file    Minutes per session: Not on file  . Stress: Not on file  Relationships  . Social connections:    Talks on phone: Not on file    Gets together: Not on file    Attends religious service: Not on file    Active member of club or organization: Not on file    Attends meetings of clubs or organizations:  Not on file    Relationship status: Not on file  Other Topics Concern  . Not on file  Social History Narrative   2 people living in the home.  Grand daughter.   Up and down through the night      Had 7 children  2 deceased . 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    works 3 days per week currently .   Glasses dentures  Neg tad     Outpatient Medications Prior to Visit  Medication Sig Dispense Refill  . acetaminophen (TYLENOL) 500 MG tablet Take 1,000 mg by mouth every 6 (six) hours as needed for moderate pain. Reported on 04/30/2015    . albuterol (PROVENTIL) (2.5 MG/3ML) 0.083% nebulizer solution USE 1 AMPULE EVERY 6 HOURS AS NEEDED FOR WHEEZE 75 mL 0  . amLODipine-olmesartan (AZOR) 10-40 MG tablet Take 1 tablet by mouth daily.    . carvedilol (COREG) 12.5 MG tablet Take 6.25 mg by mouth daily. 1/2 tablet daily    . citalopram (CELEXA) 20 MG tablet TAKE 1 TABLET ONCE DAILY. 30 tablet 5  . clopidogrel (PLAVIX) 75 MG tablet TAKE 1 TABLET ONCE DAILY. 30 tablet 2  . diclofenac sodium (VOLTAREN) 1 % GEL APPLY 4GM TO AFFECTED AREA(S) 4 TIMES A DAY. 100 g 0  . doxazosin (CARDURA) 2 MG tablet TAKE 3 TABLETS AT BEDTIME. 90 tablet 1  . fenofibrate 160 MG tablet TAKE 1 TABLET ONCE DAILY. 90 tablet 1  . ferrous sulfate 325 (65 FE) MG EC tablet Take 1 tablet (325 mg total) by mouth daily with breakfast. 30 tablet 3  . fluorometholone (FML) 0.1 % ophthalmic suspension Place 1 drop into both eyes 2 (two) times daily.     . furosemide (LASIX) 80 MG tablet Take 80 mg by mouth daily.    . hydrALAZINE (APRESOLINE) 25 MG tablet Take 1 tablet (25 mg total) by mouth 3 (three) times daily. 270 tablet 3  . lidocaine (LIDODERM) 5 % Place 1 patch onto the skin daily. Remove & Discard patch within 12 hours or as directed by MD (Patient taking differently: Place 1 patch onto the skin daily as needed (pain). Remove & Discard patch within  12 hours or as directed by MD) 30 patch 0  . LORazepam (ATIVAN) 1 MG tablet Take 1 tablet (1 mg total) by mouth every 8 (  eight) hours as needed for anxiety. Avoid regular use 24 tablet 0  . montelukast (SINGULAIR) 10 MG tablet TAKE 1 TABLET ONCE DAILY. 90 tablet 0  . pantoprazole (PROTONIX) 40 MG tablet TAKE (1) TABLET TWICE A DAY BEFORE MEALS. 60 tablet 0  . potassium chloride (K-DUR,KLOR-CON) 10 MEQ tablet Take 10 mEq by mouth 4 (four) times daily.    . RESTASIS 0.05 % ophthalmic emulsion Place 1 drop into both eyes 2 (two) times daily.     [name] Spacer/Aero-Holding Chambers (AEROCHAMBER PLUS) inhaler Use as instructed 1 each 2  . vitamin B-12 (CYANOCOBALAMIN) 1000 MCG tablet Take 1,000 mcg by mouth daily.    . Fluticasone-Salmeterol (ADVAIR) 250-50 MCG/DOSE AEPB USE 1 PUFF TWICE DAILY. 60 each 3  . PROAIR HFA 108 (90 Base) MCG/ACT inhaler USE 2 PUFFS EVERY 6 HOURS AS NEEDED FOR WHEEZING. 8.5 g 1  . potassium chloride (K-DUR) 10 MEQ tablet TAKE 4 TABLETS DAILY. (Patient not taking: Reported on 01/31/2018) 120 tablet 0   No facility-administered medications prior to visit.      EXAM:  BP (!) 150/60   Pulse (!) 53   Temp 97.7 F (36.5 C) (Oral)   Wt 171 lb 3.2 oz (77.7 kg)   BMI 31.82 kg/m   Body mass index is 31.82 kg/m.  GENERAL: vitals reviewed and listed above, alert, oriented, appears well hydrated and in no acute distress looks well today repeat blood pressure more elevated of note she states is higher in office than at home. HEENT: atraumatic, conjunctiva  clear, no obvious abnormalities on inspection of external nose and ears NECK: no obvious masses on inspection palpation  LUNGS: clear to auscultation bilaterally, no wheezes, rales or rhonchi,  CV: HRRR, no clubbing cyanosis no gallops or murmurs chronic left lower extremity edema more than right but at baseline.   MS: moves all extremities ambulatory without assistance. PSYCH: pleasant and cooperative, no obvious depression or  anxiety Lab Results  Component Value Date   WBC 3.9 (L) 09/27/2017   HGB 10.7 (L) 09/27/2017   HCT 33.4 (L) 09/27/2017   PLT 224 09/27/2017   GLUCOSE 104 (H) 09/27/2017   CHOL 170 07/09/2017   TRIG 118.0 07/09/2017   HDL 57.40 07/09/2017   LDLCALC 89 07/09/2017   ALT 12 07/09/2017   AST 25 07/09/2017   NA 138 12/03/2017   K 3.7 09/27/2017   CL 98 (L) 09/27/2017   CREATININE 2.1 (A) 12/03/2017   BUN 47 (A) 12/03/2017   CO2 27 09/27/2017   TSH 1.31 07/09/2017   INR 0.94 12/22/2009   HGBA1C 5.9 07/09/2017   MICROALBUR 10.7 (H) 04/25/2016   BP Readings from Last 3 Encounters:  01/31/18 (!) 150/60  01/21/18 (!) 160/70  12/07/17 (!) 172/62   Wt Readings from Last 3 Encounters:  01/31/18 171 lb 3.2 oz (77.7 kg)  01/21/18 172 lb 6.4 oz (78.2 kg)  12/07/17 166 lb 11.2 oz (75.6 kg)    ASSESSMENT AND PLAN:  Discussed the following assessment and plan:  Hypertensive heart disease with hypertensive chronic kidney disease, unspecified CKD stage, unspecified whether heart failure present  Need for influenza vaccination - Plan: Flu vaccine HIGH DOSE PF (Fluzone High dose)  CKD (chronic kidney disease) stage 3, GFR 30-59 ml/min (HCC)  Medication management  Hyperlipidemia, unspecified hyperlipidemia type Left leg  Pinched nerver an hx of dvt not active  resp needs refill inhalers stable  cv bp better at home  Below 150 range  No syncope dizziness  Dr Dorna Mai on maternity but  Advised ot increase hydralazinet o 100 tid but pharmacy advised that was to aggressive at one time so taking 25 tid nose  Still on hs cardura and dec carvvediolo to just 1.2 12.5 qd  Plan to eventually go off.  Card monitoring was neg  And advised cleared to go back to driving from [place] end of November ( 6 mos since doing well and no dv condition of concernt to Him)  Updated letter requested today from patient and son to  Get to [place] has sent to review about her cholesterol medicines.   Advising that by guidelines would be better to be on a statin then a fenofibrate.     As per updated prevention guidelines  shje has beenon on fenfibrate for years  ( perdating my care with her)  Numbers loiok pretty good  Will get input from cards  Changing med at this t ime  Flu vaccine today  rov in 3-4 month unless needed to come in earlier  -Patient advised to return or notify health care team  if  new concerns arise. Total visit 35 mins > 50% spent counseling and coordinating care as indicated in above note and in instructions to patient .      Patient Instructions   Glad you are doing better . Need to define BP goal which is usually below 140/90  As tolerated.   I suggest  Increase hydralazine slowly   Ex  50 25 25  For   A week  Then 50 25 50  For a week  Then 50 50 50  For a week  And  Monitor bp readings .   If you have not side effrets you can increase dose  Quicker   100 mg 3 x per day is a goal dose if needed  Would go slowly. If all else is well.   Will get input from Dr Acie Fredrickson if he thinks best to change fenfibrate which you have taken for years  And change to a statin medication  Your last  Check was very good.    Lab Results  Component Value Date   CHOL 170 07/09/2017   HDL 57.40 07/09/2017   LDLCALC 89 07/09/2017   TRIG 118.0 07/09/2017   CHOLHDL 3 07/09/2017    Will do a letter  As discussed for dmv about cardiology clearance  Etc   And contact you to pick up.      Standley Brooking. Tamaiya Bump M.D.  1.  Syncope:   Had a syncopal episode in May .   Should not drive until Nov.    13-YQM event monitor did not reveal any abnormalities that would cause syncope.  She had normal sinus rhythm and sinus bradycardia.  She had one nonsignificant 5 beat run of nonsustained ventricular tachycardia.    2.  Hypertension: She is on numerous medications.  She has stage III chronic kidney disease.  We will increase the hydralazine to 25 mg 3 times a day.  I advised her to watch out  for hidden salts.  I have advised her not to eat out at restaurants.      Signed, Mertie Moores, MD  01/21/2018 9:20 AM

## 2018-01-31 ENCOUNTER — Encounter: Payer: Self-pay | Admitting: Internal Medicine

## 2018-01-31 ENCOUNTER — Ambulatory Visit (INDEPENDENT_AMBULATORY_CARE_PROVIDER_SITE_OTHER): Payer: Medicare Other | Admitting: Internal Medicine

## 2018-01-31 VITALS — BP 150/60 | HR 53 | Temp 97.7°F | Wt 171.2 lb

## 2018-01-31 DIAGNOSIS — E785 Hyperlipidemia, unspecified: Secondary | ICD-10-CM | POA: Diagnosis not present

## 2018-01-31 DIAGNOSIS — N183 Chronic kidney disease, stage 3 unspecified: Secondary | ICD-10-CM

## 2018-01-31 DIAGNOSIS — Z79899 Other long term (current) drug therapy: Secondary | ICD-10-CM

## 2018-01-31 DIAGNOSIS — I1310 Hypertensive heart and chronic kidney disease without heart failure, with stage 1 through stage 4 chronic kidney disease, or unspecified chronic kidney disease: Secondary | ICD-10-CM | POA: Diagnosis not present

## 2018-01-31 DIAGNOSIS — Z23 Encounter for immunization: Secondary | ICD-10-CM | POA: Diagnosis not present

## 2018-01-31 MED ORDER — FLUTICASONE-SALMETEROL 250-50 MCG/DOSE IN AEPB: INHALATION_SPRAY | RESPIRATORY_TRACT | 6 refills | Status: DC

## 2018-01-31 MED ORDER — ALBUTEROL SULFATE HFA 108 (90 BASE) MCG/ACT IN AERS: INHALATION_SPRAY | RESPIRATORY_TRACT | 1 refills | Status: DC

## 2018-01-31 NOTE — Patient Instructions (Signed)
Glad you are doing better . Need to define BP goal which is usually below 140/90  As tolerated.   I suggest  Increase hydralazine slowly   Ex  50 25 25  For   A week  Then 50 25 50  For a week  Then 50 50 50  For a week  And  Monitor bp readings .   If you have not side effrets you can increase dose  Quicker   100 mg 3 x per day is a goal dose if needed  Would go slowly. If all else is well.   Will get input from Dr Acie Fredrickson if he thinks best to change fenfibrate which you have taken for years  And change to a statin medication  Your last  Check was very good.    Lab Results  Component Value Date   CHOL 170 07/09/2017   HDL 57.40 07/09/2017   LDLCALC 89 07/09/2017   TRIG 118.0 07/09/2017   CHOLHDL 3 07/09/2017    Will do a letter  As discussed for dmv about cardiology clearance  Etc   And contact you to pick up.

## 2018-02-04 ENCOUNTER — Ambulatory Visit: Payer: Medicare Other | Admitting: Internal Medicine

## 2018-02-06 ENCOUNTER — Telehealth: Payer: Self-pay | Admitting: *Deleted

## 2018-02-06 NOTE — Telephone Encounter (Signed)
-----   Message from Burnis Medin, MD sent at 02/05/2018  1:09 PM EDT ----- Regarding: lipid meds  pn thanks  For your input. Vernell Back Inform patient that touched base with her cardiologist and advise that  She  stop the fenofibrate and begin atorvastatin 20 mg per day disp 90 refill x 1  Check lipid  Panel in 3 months Thanks [facility] ----- Message ----- From: Thayer Headings, MD Sent: 02/01/2018   8:52 AM EDT To: Burnis Medin, MD  She has   minimal carotid disease and DM. I think treatment with Atorvastatin 20 mg might get her LDL < 70.  Would check lipids in 3 months after the med change  I do not see any elevated Triglyceride levels - perhaps they were higher in the past.  I would agree that she may not need the fenofibrate.    Phil    ----- Message ----- From: Burnis Medin, MD Sent: 01/31/2018  12:30 PM EDT To: Thayer Headings, MD  FYI thanks for your note . What is your advice about stopping the fenfibrate and adding a statin medication instead .   Her numbers are pretty good at this time  Thanks  [facility]

## 2018-02-07 MED ORDER — ATORVASTATIN CALCIUM 20 MG PO TABS: 20.0000 mg | ORAL_TABLET | Freq: Every day | ORAL | 1 refills | Status: DC

## 2018-02-07 NOTE — Telephone Encounter (Signed)
Pt aware of rec's Rx sent to [facility] scheduled 05/13/18 for lab visit to recheck lipid panel at 1030a  Nothing further needed.

## 2018-02-11 ENCOUNTER — Other Ambulatory Visit: Payer: Self-pay | Admitting: Internal Medicine

## 2018-02-13 ENCOUNTER — Telehealth: Payer: Self-pay | Admitting: Internal Medicine

## 2018-02-13 DIAGNOSIS — I129 Hypertensive chronic kidney disease with stage 1 through stage 4 chronic kidney disease, or unspecified chronic kidney disease: Secondary | ICD-10-CM | POA: Diagnosis not present

## 2018-02-13 DIAGNOSIS — D631 Anemia in chronic kidney disease: Secondary | ICD-10-CM | POA: Diagnosis not present

## 2018-02-13 DIAGNOSIS — N189 Chronic kidney disease, unspecified: Secondary | ICD-10-CM | POA: Diagnosis not present

## 2018-02-13 DIAGNOSIS — N2581 Secondary hyperparathyroidism of renal origin: Secondary | ICD-10-CM | POA: Diagnosis not present

## 2018-02-13 DIAGNOSIS — M329 Systemic lupus erythematosus, unspecified: Secondary | ICD-10-CM | POA: Diagnosis not present

## 2018-02-13 DIAGNOSIS — N183 Chronic kidney disease, stage 3 (moderate): Secondary | ICD-10-CM | POA: Diagnosis not present

## 2018-02-13 NOTE — Telephone Encounter (Signed)
Copied from [place] [id number]. Topic: Quick Communication - See Telephone Encounter >> Feb 13, 2018 11:19 AM Burchel, Abbi R wrote: CRM for notification. See Telephone encounter for: 02/13/18.  Per Juanell Fairly, Dr Hollie Salk at [facility]-  Please note that Dr Hollie Salk did not increase to  hydrALAZINE (APRESOLINE) 100 MG tablet TID. Please see note 01/09/18.  Pt is currently taking 50mg  TID of hydrALAZINE (APRESOLINE).   Increased Lasix to: 80 in AM and 40 in afternoon due to persistent lower extremity edema.   Velva Harman: [phone]

## 2018-02-13 NOTE — Telephone Encounter (Signed)
Information has been updated in Epic.

## 2018-02-22 ENCOUNTER — Encounter: Payer: Self-pay | Admitting: Internal Medicine

## 2018-02-22 DIAGNOSIS — H40023 Open angle with borderline findings, high risk, bilateral: Secondary | ICD-10-CM | POA: Diagnosis not present

## 2018-02-22 DIAGNOSIS — H35033 Hypertensive retinopathy, bilateral: Secondary | ICD-10-CM | POA: Diagnosis not present

## 2018-02-22 DIAGNOSIS — Z961 Presence of intraocular lens: Secondary | ICD-10-CM | POA: Diagnosis not present

## 2018-02-22 DIAGNOSIS — H04123 Dry eye syndrome of bilateral lacrimal glands: Secondary | ICD-10-CM | POA: Diagnosis not present

## 2018-02-27 ENCOUNTER — Other Ambulatory Visit: Payer: Self-pay | Admitting: Internal Medicine

## 2018-03-07 ENCOUNTER — Other Ambulatory Visit: Payer: Self-pay | Admitting: Internal Medicine

## 2018-03-14 DIAGNOSIS — N183 Chronic kidney disease, stage 3 (moderate): Secondary | ICD-10-CM | POA: Diagnosis not present

## 2018-03-14 DIAGNOSIS — I129 Hypertensive chronic kidney disease with stage 1 through stage 4 chronic kidney disease, or unspecified chronic kidney disease: Secondary | ICD-10-CM | POA: Diagnosis not present

## 2018-03-19 ENCOUNTER — Other Ambulatory Visit: Payer: Self-pay | Admitting: Internal Medicine

## 2018-03-25 ENCOUNTER — Encounter: Payer: Self-pay | Admitting: Internal Medicine

## 2018-03-25 ENCOUNTER — Ambulatory Visit (INDEPENDENT_AMBULATORY_CARE_PROVIDER_SITE_OTHER): Payer: Medicare Other | Admitting: Internal Medicine

## 2018-03-25 VITALS — BP 122/54 | HR 62 | Temp 98.0°F | Wt 179.4 lb

## 2018-03-25 DIAGNOSIS — I10 Essential (primary) hypertension: Secondary | ICD-10-CM

## 2018-03-25 DIAGNOSIS — N289 Disorder of kidney and ureter, unspecified: Secondary | ICD-10-CM | POA: Diagnosis not present

## 2018-03-25 DIAGNOSIS — E785 Hyperlipidemia, unspecified: Secondary | ICD-10-CM

## 2018-03-25 DIAGNOSIS — I1310 Hypertensive heart and chronic kidney disease without heart failure, with stage 1 through stage 4 chronic kidney disease, or unspecified chronic kidney disease: Secondary | ICD-10-CM

## 2018-03-25 DIAGNOSIS — R7303 Prediabetes: Secondary | ICD-10-CM | POA: Diagnosis not present

## 2018-03-25 DIAGNOSIS — D638 Anemia in other chronic diseases classified elsewhere: Secondary | ICD-10-CM

## 2018-03-25 LAB — CBC WITH DIFFERENTIAL/PLATELET
BASOS PCT: 0.9 % (ref 0.0–3.0)
Basophils Absolute: 0 10*3/uL (ref 0.0–0.1)
EOS ABS: 0.1 10*3/uL (ref 0.0–0.7)
Eosinophils Relative: 2.8 % (ref 0.0–5.0)
HCT: 28.4 % — ABNORMAL LOW (ref 36.0–46.0)
HEMOGLOBIN: 9.5 g/dL — AB (ref 12.0–15.0)
Lymphocytes Relative: 24 % (ref 12.0–46.0)
Lymphs Abs: 1 10*3/uL (ref 0.7–4.0)
MCHC: 33.5 g/dL (ref 30.0–36.0)
MCV: 86.9 fl (ref 78.0–100.0)
MONO ABS: 0.4 10*3/uL (ref 0.1–1.0)
Monocytes Relative: 9.3 % (ref 3.0–12.0)
Neutro Abs: 2.5 10*3/uL (ref 1.4–7.7)
Neutrophils Relative %: 63 % (ref 43.0–77.0)
PLATELETS: 186 10*3/uL (ref 150.0–400.0)
RBC: 3.27 Mil/uL — ABNORMAL LOW (ref 3.87–5.11)
RDW: 13.3 % (ref 11.5–15.5)
WBC: 4 10*3/uL (ref 4.0–10.5)

## 2018-03-25 LAB — BASIC METABOLIC PANEL
BUN: 39 mg/dL — AB (ref 6–23)
CHLORIDE: 97 meq/L (ref 96–112)
CO2: 29 mEq/L (ref 19–32)
CREATININE: 2.16 mg/dL — AB (ref 0.40–1.20)
Calcium: 9.5 mg/dL (ref 8.4–10.5)
GFR: 28.06 mL/min — ABNORMAL LOW (ref >60.00)
Glucose, Bld: 116 mg/dL — ABNORMAL HIGH (ref 70–99)
Potassium: 4.1 mEq/L (ref 3.5–5.1)
Sodium: 135 mEq/L (ref 135–145)

## 2018-03-25 LAB — LIPID PANEL
CHOLESTEROL: 177 mg/dL (ref 0–200)
HDL: 73.6 mg/dL (ref >39.00)
LDL CALC: 89 mg/dL (ref 0–99)
NonHDL: 103.42
TRIGLYCERIDES: 72 mg/dL (ref 0.0–149.0)
Total CHOL/HDL Ratio: 2
VLDL: 14.4 mg/dL (ref 0.0–40.0)

## 2018-03-25 LAB — HEPATIC FUNCTION PANEL
ALT: 6 U/L (ref 0–35)
AST: 18 U/L (ref 0–37)
Albumin: 4 g/dL (ref 3.5–5.2)
Alkaline Phosphatase: 37 U/L — ABNORMAL LOW (ref 39–117)
BILIRUBIN TOTAL: 0.5 mg/dL (ref 0.2–1.2)
Bilirubin, Direct: 0.1 mg/dL (ref 0.0–0.3)
Total Protein: 6.3 g/dL (ref 6.0–8.3)

## 2018-03-25 LAB — HEMOGLOBIN A1C: HEMOGLOBIN A1C: 5.8 % (ref 4.6–6.5)

## 2018-03-25 NOTE — Patient Instructions (Addendum)
Your blood pressure is excellent today    Guess the hydralazine seems to be helping you the most.  And no need to increase dosing at this time   Will notify you  of labs when available.   Will write letter for [facility]  For reinstating licensing .   If bp is too low   Below 110 and feeling  lightheaded contact the team for  Advice  possible to decrease  Medications.    [name] depending on labs and how doing  Or 4 months .

## 2018-03-25 NOTE — Telephone Encounter (Signed)
Patient came in for an office visit 03/25/18

## 2018-03-25 NOTE — Progress Notes (Signed)
Chief Complaint  Patient presents with  . Follow-up  . Medication Management  . Hypertension    HPI: [name] 84 y.o. come in for Chronic disease management    Here with family member  Fu  For  Med conditions and letter for restoring  driving privileges    Changed    Lipid med   Dc fenfibrate and change ot lipitor     bp med reviewed  Azore, low dose carvedilol, cardura  And  Hydralazine tid most days .  lasix 80 am 40 pm with 40 kcl per day    resp stable recently  Swelling no worsening  changes   ROS: See pertinent positives and negatives per HPI. No cp sob syncope memory issues or cognitive changes  Gest ocass anxiety palpitations and uses  Ativan sparingly   Past Medical History:  Diagnosis Date  . Acute superficial venous thrombosis of left lower extremity 03/27/2014   concern about hx and potential extensive on exam tenderness calf and low dose lovenox 40 qd 2-4 weekselevetion and close  fu advised .   [name] Allergy   . Anemia   . Anxiety   . Arthritis    "shoulders" (09/09/2012)  . Asthma 09/08/2012  . Carotid bruit    [place] 2018  low risk 1 - 39%  . Diabetes mellitus without complication ([place])    "Borderline" per pt; being monitored.  no meds per pt  . Dyspnea 04/27/2014  . GERD (gastroesophageal reflux disease)   . Headache(784.0)    "related to my high blood pressure" (09/09/2012)  . History of DVT (deep vein thrombosis)    "RLE" (09/09/2012) coumadine cant take asa so on plavix   . HTN (hypertension) 09/08/2012  . Hyperlipidemia   . Lupus ([place])    "cured years ago" (09/09/2012)  . Other dysphagia 02/11/2013  . Syncope and collapse 01/21/2018  . Varicose veins   . Varicose veins of leg with complications 3/87/5643    Family History  Problem Relation Age of Onset  . Hypertension Mother   . Breast cancer Mother   . Hypertension Father   . Cancer Sister        Colon and Breast Cancer  . Breast cancer Sister   . Colon cancer Sister        ? 73' s dx -  died in 48's  . Hypertension Brother   . Rectal cancer Brother   . Stomach cancer Brother   . Breast cancer Brother   . Heart attack Daughter   . Esophageal cancer Daughter   . Lung cancer Unknown        both parents   . Breast cancer Paternal Aunt     Social History   Socioeconomic History  . Marital status: Single    Spouse name: Not on file  . Number of children: 6  . Years of education: Not on file  . Highest education level: Not on file  Occupational History  . Not on file  Social Needs  . Financial resource strain: Not on file  . Food insecurity:    Worry: Not on file    Inability: Not on file  . Transportation needs:    Medical: Not on file    Non-medical: Not on file  Tobacco Use  . Smoking status: Never Smoker  . Smokeless tobacco: Never Used  Substance and Sexual Activity  . Alcohol use: No  . Drug use: No  . Sexual activity: Not on file  Lifestyle  . Physical activity:    Days per week: Not on file    Minutes per session: Not on file  . Stress: Not on file  Relationships  . Social connections:    Talks on phone: Not on file    Gets together: Not on file    Attends religious service: Not on file    Active member of club or organization: Not on file    Attends meetings of clubs or organizations: Not on file    Relationship status: Not on file  Other Topics Concern  . Not on file  Social History Narrative   2 people living in the home.  Grand daughter.   Up and down through the night      Had 7 children  2 deceased . 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    works 3 days per week currently .   Glasses dentures  Neg tad     Outpatient Medications Prior to Visit  Medication Sig Dispense Refill  . acetaminophen (TYLENOL) 500 MG tablet Take 1,000 mg by mouth every 6 (six) hours as needed for moderate pain. Reported on 04/30/2015    . albuterol (PROAIR HFA) 108 (90 Base)  MCG/ACT inhaler USE 2 PUFFS EVERY 6 HOURS AS NEEDED FOR WHEEZING. 8.5 g 1  . albuterol (PROVENTIL) (2.5 MG/3ML) 0.083% nebulizer solution USE 1 AMPULE EVERY 6 HOURS AS NEEDED FOR WHEEZE 75 mL 0  . amLODipine-olmesartan (AZOR) 10-40 MG tablet Take 1 tablet by mouth daily.    [name] atorvastatin (LIPITOR) 20 MG tablet Take 1 tablet (20 mg total) by mouth daily. 90 tablet 1  . carvedilol (COREG) 12.5 MG tablet Take 6.25 mg by mouth daily. 1/2 tablet daily    . citalopram (CELEXA) 20 MG tablet TAKE 1 TABLET ONCE DAILY. 30 tablet 5  . clopidogrel (PLAVIX) 75 MG tablet TAKE 1 TABLET ONCE DAILY. 30 tablet 0  . diclofenac sodium (VOLTAREN) 1 % GEL APPLY 4GM TO AFFECTED AREA(S) 4 TIMES A DAY. 100 g 0  . doxazosin (CARDURA) 2 MG tablet TAKE 3 TABLETS AT BEDTIME. 90 tablet 0  . fenofibrate 160 MG tablet TAKE 1 TABLET ONCE DAILY. 90 tablet 1  . ferrous sulfate 325 (65 FE) MG EC tablet Take 1 tablet (325 mg total) by mouth daily with breakfast. 30 tablet 3  . fluorometholone (FML) 0.1 % ophthalmic suspension Place 1 drop into both eyes 2 (two) times daily.     . Fluticasone-Salmeterol (ADVAIR) 250-50 MCG/DOSE AEPB USE 1 PUFF TWICE DAILY. 60 each 6  . furosemide (LASIX) 80 MG tablet Take 80 mg in the morning and 40 mg in the evening due to persistent lower extremity edema.    . hydrALAZINE (APRESOLINE) 50 MG tablet Take 50 mg by mouth 3 (three) times daily.    [name] lidocaine (LIDODERM) 5 % Place 1 patch onto the skin daily. Remove & Discard patch within 12 hours or as directed by MD (Patient taking differently: Place 1 patch onto the skin daily as needed (pain). Remove & Discard patch within 12 hours or as directed by MD) 30 patch 0  . LORazepam (ATIVAN) 1 MG tablet Take 1 tablet (1 mg total) by mouth every 8 (eight) hours as needed for anxiety. Avoid regular use 24 tablet 0  . montelukast (SINGULAIR) 10 MG tablet TAKE 1 TABLET ONCE DAILY. 90 tablet 0  . pantoprazole (  PROTONIX) 40 MG tablet TAKE (1) TABLET TWICE A DAY  BEFORE MEALS. 60 tablet 0  . potassium chloride (K-DUR,KLOR-CON) 10 MEQ tablet Take 10 mEq by mouth 4 (four) times daily.    . RESTASIS 0.05 % ophthalmic emulsion Place 1 drop into both eyes 2 (two) times daily.     [name] Spacer/Aero-Holding Chambers (AEROCHAMBER PLUS) inhaler Use as instructed 1 each 2  . vitamin B-12 (CYANOCOBALAMIN) 1000 MCG tablet Take 1,000 mcg by mouth daily.    . potassium chloride (K-DUR) 10 MEQ tablet TAKE 4 TABLETS DAILY. 120 tablet 0   No facility-administered medications prior to visit.      EXAM:  BP (!) 122/54 (BP Location: Left Arm, Patient Position: Sitting, Cuff Size: Large)   Pulse 62   Temp 98 F (36.7 C) (Oral)   Wt 179 lb 6.4 oz (81.4 kg)   SpO2 97%   BMI 33.35 kg/m   Body mass index is 33.35 kg/m.  GENERAL: vitals reviewed and listed above, alert, oriented, appears well hydrated and in no acute distress HEENT: atraumatic, conjunctiva  clear, no obvious abnormalities on inspection of external nose and ears  NECK: no obvious masses on inspection palpation  LUNGS: clear to auscultation bilaterally, no wheezes, rales or rhonchi, CV: HRRR, no clubbing cyanosisleft ankle 1+2 edema  right  Minimal  In compression stockings nl cap refill  MS: moves all extremities without noticeable focal  Abnormality djd ambulatory  PSYCH: pleasant and cooperative, no obvious depression or anxiety Lab Results  Component Value Date   WBC 4.0 03/25/2018   HGB 9.5 (L) 03/25/2018   HCT 28.4 (L) 03/25/2018   PLT 186.0 03/25/2018   GLUCOSE 116 (H) 03/25/2018   CHOL 177 03/25/2018   TRIG 72.0 03/25/2018   HDL 73.60 03/25/2018   LDLCALC 89 03/25/2018   ALT 6 03/25/2018   AST 18 03/25/2018   NA 135 03/25/2018   K 4.1 03/25/2018   CL 97 03/25/2018   CREATININE 2.16 (H) 03/25/2018   BUN 39 (H) 03/25/2018   CO2 29 03/25/2018   TSH 1.31 07/09/2017   INR 0.94 12/22/2009   HGBA1C 5.8 03/25/2018   MICROALBUR 10.7 (H) 04/25/2016   BP Readings from Last 3 Encounters:    03/25/18 (!) 122/54  01/31/18 (!) 150/60  01/21/18 (!) 160/70   Wt Readings from Last 3 Encounters:  03/25/18 179 lb 6.4 oz (81.4 kg)  01/31/18 171 lb 3.2 oz (77.7 kg)  01/21/18 172 lb 6.4 oz (78.2 kg)     ASSESSMENT AND PLAN:  Discussed the following assessment and plan:  Essential hypertension - improved control  see text  response to hyudralazine.  - Plan: Basic metabolic panel, CBC with Differential/Platelet, Hemoglobin A1c, Hepatic function panel, Lipid panel  Renal insufficiency creatinine 2 - Plan: Basic metabolic panel, CBC with Differential/Platelet, Hemoglobin A1c, Hepatic function panel, Lipid panel  Hyperlipidemia, unspecified hyperlipidemia type - Plan: Basic metabolic panel, CBC with Differential/Platelet, Hemoglobin A1c, Hepatic function panel, Lipid panel  Hypertensive heart disease with hypertensive chronic kidney disease, unspecified CKD stage, unspecified whether heart failure present - Plan: Basic metabolic panel, CBC with Differential/Platelet, Hemoglobin A1c, Hepatic function panel, Lipid panel  Pre-diabetes vs early DM   Anemia, chronic disease Taking 50 tid hydralazine.. the most recent change and seems to be working the best . ?   Would not inc to 100 as planned because of response .  And want to avoid hypotension Change to atorva from fenofibrate for vascular risk   Reduction.  Will write letter to [facility]  As planned . Ok to drive  As needed . -Patient advised to return or notify health care team  if  new concerns arise. In interim   Patient Instructions  Your blood pressure is excellent today    Guess the hydralazine seems to be helping you the most.  And no need to increase dosing at this time   Will notify you  of labs when available.   Will write letter for [facility]  For reinstating licensing .   If bp is too low   Below 110 and feeling  lightheaded contact the team for  Advice  possible to decrease  Medications.    [name] depending on labs and how doing   Or 4 months . Standley Brooking. Marthe Dant M.D.

## 2018-03-29 ENCOUNTER — Encounter: Payer: Self-pay | Admitting: Internal Medicine

## 2018-03-29 ENCOUNTER — Telehealth: Payer: Self-pay | Admitting: *Deleted

## 2018-03-29 NOTE — Telephone Encounter (Signed)
Will send to Dr Regis Bill

## 2018-03-29 NOTE — Telephone Encounter (Signed)
Copied from [place] [id number]. Topic: Quick Communication - See Telephone Encounter >> Mar 29, 2018  3:12 PM Hewitt Shorts wrote: Pt is calling to state the letter that  the [facility]  is requesting that the medical review from today needs to be faxed  Monday  to [phone]   Customer number [id number]  Best number  [phone]

## 2018-03-29 NOTE — Telephone Encounter (Signed)
Letter printed   Please send

## 2018-03-29 NOTE — Telephone Encounter (Signed)
See patient email. Letter being faxed Nothing further needed.

## 2018-04-01 ENCOUNTER — Other Ambulatory Visit: Payer: Self-pay | Admitting: Internal Medicine

## 2018-04-19 NOTE — Telephone Encounter (Signed)
Pt states the DMV never received the form and would like the letter to be refaxed.

## 2018-04-20 ENCOUNTER — Other Ambulatory Visit: Payer: Self-pay | Admitting: Internal Medicine

## 2018-04-22 ENCOUNTER — Telehealth: Payer: Self-pay

## 2018-04-22 NOTE — Patient Instructions (Addendum)
Glad you are doing well will  Get an updated letter for you .   Since BP is up today and again   Increase hydralazine to 100 mg - 50 - 50  After  1-2 weeks  Can increaese to 100mg  - 50 - 100 mg per day  IF  If bp   140 and above.  Will refill lorazepam  Use sparingly with caution.   Your kidney function was off last time  Keep appt with dr Hollie Salk.

## 2018-04-22 NOTE — Telephone Encounter (Signed)
I have called and LM for call back  From what I am understanding the patient is needing an OV and a letter dated AFTER 03/31/18 not the letter that was previously written 03/25/18. The letter written 03/25/18 cannot be used bc it is dated before 03/31/18.   Patient will need to be worked in asap.   Please advise Dr Regis Bill, thanks.

## 2018-04-22 NOTE — Telephone Encounter (Signed)
See 04/22/18 TE There is a problem with the letter date  Will close this encounter

## 2018-04-22 NOTE — Telephone Encounter (Signed)
Spoke with patient - Pt states that when she was seen 03/25/18 it was too soon and appt should have been done after 03/31/18. Pt was still considered as being under observation by DMV on 11/11. Letter and OV will not stand for the DMV. Pt needs another letter and OV. Observation period ended on 03/31/18.     Per Dr Regis Bill , can schedule 04/23/18 at 3:30 -- arrive at 3:15.   Nothing further needed.

## 2018-04-22 NOTE — Progress Notes (Signed)
Chief Complaint  Patient presents with  . Follow-up    Here for [facility] follow - needs letter for release. previous letter written does not count as it was written during her observation period which ended on 03/31/18    HPI: [name] 84 y.o. come in forfu  dmv  Letter   Since last visit 3 weeks ago  No recurrance of sx of concern    Needs refill  loraz in case rare use.  Leg still swelling  Off fenofibrate   Now on atorva  No syncope cp falling   On hydralazine 50 tid  bp seems to be running  About 150  No inc sob  ROS: See pertinent positives and negatives per HPI.  Past Medical History:  Diagnosis Date  . Acute superficial venous thrombosis of left lower extremity 03/27/2014   concern about hx and potential extensive on exam tenderness calf and low dose lovenox 40 qd 2-4 weekselevetion and close  fu advised .   [name] Allergy   . Anemia   . Anxiety   . Arthritis    "shoulders" (09/09/2012)  . Asthma 09/08/2012  . Carotid bruit    [place] 2018  low risk 1 - 39%  . Diabetes mellitus without complication ([place])    "Borderline" per pt; being monitored.  no meds per pt  . Dyspnea 04/27/2014  . GERD (gastroesophageal reflux disease)   . Headache(784.0)    "related to my high blood pressure" (09/09/2012)  . History of DVT (deep vein thrombosis)    "RLE" (09/09/2012) coumadine cant take asa so on plavix   . HTN (hypertension) 09/08/2012  . Hyperlipidemia   . Lupus ([place])    "cured years ago" (09/09/2012)  . Other dysphagia 02/11/2013  . Syncope and collapse 01/21/2018  . Varicose veins   . Varicose veins of leg with complications 7/78/2423    Family History  Problem Relation Age of Onset  . Hypertension Mother   . Breast cancer Mother   . Hypertension Father   . Cancer Sister        Colon and Breast Cancer  . Breast cancer Sister   . Colon cancer Sister        ? 49' s dx - died in 2's  . Hypertension Brother   . Rectal cancer Brother   . Stomach cancer Brother   .  Breast cancer Brother   . Heart attack Daughter   . Esophageal cancer Daughter   . Lung cancer Unknown        both parents   . Breast cancer Paternal Aunt     Social History   Socioeconomic History  . Marital status: Single    Spouse name: Not on file  . Number of children: 6  . Years of education: Not on file  . Highest education level: Not on file  Occupational History  . Not on file  Social Needs  . Financial resource strain: Not on file  . Food insecurity:    Worry: Not on file    Inability: Not on file  . Transportation needs:    Medical: Not on file    Non-medical: Not on file  Tobacco Use  . Smoking status: Never Smoker  . Smokeless tobacco: Never Used  Substance and Sexual Activity  . Alcohol use: No  . Drug use: No  . Sexual activity: Not on file  Lifestyle  . Physical activity:    Days per week: Not on file  Minutes per session: Not on file  . Stress: Not on file  Relationships  . Social connections:    Talks on phone: Not on file    Gets together: Not on file    Attends religious service: Not on file    Active member of club or organization: Not on file    Attends meetings of clubs or organizations: Not on file    Relationship status: Not on file  Other Topics Concern  . Not on file  Social History Narrative   2 people living in the home.  Grand daughter.   Up and down through the night      Had 7 children  2 deceased . 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    works 3 days per week currently .   Glasses dentures  Neg tad     Allergies as of 04/23/2018      Reactions   Penicillins Nausea And Vomiting, Other (See Comments)   Has patient had a PCN reaction causing immediate rash, facial/tongue/throat swelling, SOB or lightheadedness with hypotension: Y Has patient had a PCN reaction causing severe rash involving mucus membranes or skin necrosis: Y Has patient had a  PCN reaction that required hospitalization: N Has patient had a PCN reaction occurring within the last 10 years: N If all of the above answers are "NO", then may proceed with Cephalosporin use.   Aspirin Other (See Comments)   Wheezing Acetaminophen is OK      Medication List        Accurate as of 04/23/18  7:04 PM. Always use your most recent med list.          acetaminophen 500 MG tablet Commonly known as:  TYLENOL Take 1,000 mg by mouth every 6 (six) hours as needed for moderate pain. Reported on 04/30/2015   AEROCHAMBER PLUS inhaler Use as instructed   albuterol (2.5 MG/3ML) 0.083% nebulizer solution Commonly known as:  PROVENTIL USE 1 AMPULE EVERY 6 HOURS AS NEEDED FOR WHEEZE   albuterol 108 (90 Base) MCG/ACT inhaler Commonly known as:  PROVENTIL HFA;VENTOLIN HFA USE 2 PUFFS EVERY 6 HOURS AS NEEDED FOR WHEEZING.   amLODipine-olmesartan 10-40 MG tablet Commonly known as:  AZOR Take 1 tablet by mouth daily.   atorvastatin 20 MG tablet Commonly known as:  LIPITOR Take 1 tablet (20 mg total) by mouth daily.   carvedilol 12.5 MG tablet Commonly known as:  COREG Take 6.25 mg by mouth daily. 1/2 tablet daily   citalopram 20 MG tablet Commonly known as:  CELEXA TAKE 1 TABLET ONCE DAILY.   clopidogrel 75 MG tablet Commonly known as:  PLAVIX TAKE 1 TABLET ONCE DAILY.   diclofenac sodium 1 % Gel Commonly known as:  VOLTAREN APPLY 4GM TO AFFECTED AREA(S) 4 TIMES A DAY.   doxazosin 2 MG tablet Commonly known as:  CARDURA TAKE 3 TABLETS AT BEDTIME.   ferrous sulfate 325 (65 FE) MG EC tablet Take 1 tablet (325 mg total) by mouth daily with breakfast.   fluorometholone 0.1 % ophthalmic suspension Commonly known as:  FML Place 1 drop into both eyes 2 (two) times daily.   Fluticasone-Salmeterol 250-50 MCG/DOSE Aepb Commonly known as:  ADVAIR USE 1 PUFF TWICE DAILY.   furosemide 80 MG tablet Commonly known as:  LASIX Take 80 mg in the morning and 40 mg in the  evening due to persistent  lower extremity edema.   hydrALAZINE 50 MG tablet Commonly known as:  APRESOLINE Take 50 mg by mouth 3 (three) times daily.   lidocaine 5 % Commonly known as:  LIDODERM Place 1 patch onto the skin daily. Remove & Discard patch within 12 hours or as directed by MD   LORazepam 1 MG tablet Commonly known as:  ATIVAN Take 1 tablet (1 mg total) by mouth every 8 (eight) hours as needed for anxiety. Avoid regular use   montelukast 10 MG tablet Commonly known as:  SINGULAIR TAKE 1 TABLET ONCE DAILY.   pantoprazole 40 MG tablet Commonly known as:  PROTONIX TAKE (1) TABLET TWICE A DAY BEFORE MEALS.   potassium chloride 10 MEQ tablet Commonly known as:  K-DUR,KLOR-CON Take 10 mEq by mouth 4 (four) times daily.   RESTASIS 0.05 % ophthalmic emulsion Generic drug:  cycloSPORINE Place 1 drop into both eyes 2 (two) times daily.   vitamin B-12 1000 MCG tablet Commonly known as:  CYANOCOBALAMIN Take 1,000 mcg by mouth daily.         EXAM:  BP (!) 152/52 (BP Location: Left Arm, Patient Position: Sitting, Cuff Size: Normal)   Pulse 63   Wt 179 lb 6.4 oz (81.4 kg)   BMI 33.35 kg/m   Body mass index is 33.35 kg/m.  GENERAL: vitals reviewed and listed above, alert, oriented, appears well hydrated and in no acute distress HEENT: atraumatic, conjunctiva  clear, no obvious abnormalities on inspection of external nose and ears NECK: no obvious masses on inspection palpation  LUNGS: clear to auscultation bilaterally, no wheezes, rales or rhonchi,  CV: HRRR, no clubbing cyanosis 2 + edema peripheral edema nl cap refill  MS: moves all extremities without noticeable focal  abnormality PSYCH: pleasant and cooperative, no obvious depression or anxiety  BP Readings from Last 3 Encounters:  04/23/18 (!) 152/52  03/25/18 (!) 122/54  01/31/18 (!) 150/60   Wt Readings from Last 3 Encounters:  04/23/18 179 lb 6.4 oz (81.4 kg)  03/25/18 179 lb 6.4 oz (81.4 kg)    01/31/18 171 lb 3.2 oz (77.7 kg)     ASSESSMENT AND PLAN:  Discussed the following assessment and plan:  Essential hypertension  Medication management  CKD (chronic kidney disease) stage 3, GFR 30-59 ml/min (HCC)  Renal insufficiency creatinine 2  Edema, unspecified type  History of syncope - no recurrence  Updated letter   Given  And to inc hydralazine  Slowly for bp control   She is to see dr Ponciano Ort in January  -Patient advised to return or notify health care team  if  new concerns arise.  Patient Instructions  Glad you are doing well will  Get an updated letter for you .   Since BP is up today and again   Increase hydralazine to 100 mg - 50 - 50  After  1-2 weeks  Can increaese to 100mg  - 50 - 100 mg per day  IF  If bp   140 and above.  Will refill lorazepam  Use sparingly with caution.   Your kidney function was off last time  Keep appt with dr Hollie Salk.         Standley Brooking. Panosh M.D.

## 2018-04-22 NOTE — Telephone Encounter (Signed)
Copied from [place] [phone]. Topic: General - Other >> Apr 19, 2018  5:09 PM Yvette Rack wrote: Reason for CRM: Patient stated she was advised by [facility] that the letter is no good because it had to be dated November 17,2019 or after. Patients requests a call back. Cb# [phone]

## 2018-04-22 NOTE — Telephone Encounter (Signed)
Pt was seen on 03/25/2018 Letter was typed on 03/29/2018 Not understanding why letter needed to be post dated to 03/31/2018??  LM at number listed below for a call back

## 2018-04-23 ENCOUNTER — Encounter: Payer: Self-pay | Admitting: Internal Medicine

## 2018-04-23 ENCOUNTER — Other Ambulatory Visit: Payer: Self-pay | Admitting: Internal Medicine

## 2018-04-23 ENCOUNTER — Ambulatory Visit (INDEPENDENT_AMBULATORY_CARE_PROVIDER_SITE_OTHER): Payer: Medicare Other | Admitting: Internal Medicine

## 2018-04-23 VITALS — BP 152/52 | HR 63 | Wt 179.4 lb

## 2018-04-23 DIAGNOSIS — I10 Essential (primary) hypertension: Secondary | ICD-10-CM | POA: Diagnosis not present

## 2018-04-23 DIAGNOSIS — Z79899 Other long term (current) drug therapy: Secondary | ICD-10-CM | POA: Diagnosis not present

## 2018-04-23 DIAGNOSIS — Z87898 Personal history of other specified conditions: Secondary | ICD-10-CM

## 2018-04-23 DIAGNOSIS — N289 Disorder of kidney and ureter, unspecified: Secondary | ICD-10-CM | POA: Diagnosis not present

## 2018-04-23 DIAGNOSIS — N183 Chronic kidney disease, stage 3 unspecified: Secondary | ICD-10-CM

## 2018-04-23 DIAGNOSIS — R609 Edema, unspecified: Secondary | ICD-10-CM | POA: Diagnosis not present

## 2018-04-23 MED ORDER — LORAZEPAM 1 MG PO TABS: 1.0000 mg | ORAL_TABLET | Freq: Three times a day (TID) | ORAL | 0 refills | Status: DC | PRN

## 2018-04-23 NOTE — Telephone Encounter (Signed)
Done at  [facility] today

## 2018-04-23 NOTE — Telephone Encounter (Signed)
Pt seen today, please advise if okay to refill.

## 2018-04-24 ENCOUNTER — Telehealth: Payer: Self-pay | Admitting: *Deleted

## 2018-04-24 ENCOUNTER — Ambulatory Visit (INDEPENDENT_AMBULATORY_CARE_PROVIDER_SITE_OTHER): Payer: Self-pay | Admitting: Family Medicine

## 2018-04-24 NOTE — Telephone Encounter (Signed)
Copied from [place] [phone]. Topic: General - Other >> Apr 24, 2018  9:05 AM Burchel, Abbi R wrote: Reason for CRM:   Pt son states that the letter recently mailed re: pt's driver's license needs to have the following corrections made and be re faxed to [phone],   Please remove Ms Gating's name and phone number in both places it is listed and replace it with:  "ATTN: DMV Medical Review Board"  Please replace the phrase "re-instituted " with "reinstated"

## 2018-04-24 NOTE — Telephone Encounter (Signed)
Letter updated as requested, stamped and faxed back.  Pt aware via detailed voicemail that this has been taken care of.  Nothing further needed.

## 2018-04-26 NOTE — Telephone Encounter (Signed)
Patient is requesting a copy of letter sent to her personal address, if possible.

## 2018-05-02 ENCOUNTER — Ambulatory Visit: Payer: Medicare Other | Admitting: Internal Medicine

## 2018-05-04 ENCOUNTER — Other Ambulatory Visit: Payer: Self-pay | Admitting: Internal Medicine

## 2018-05-13 ENCOUNTER — Ambulatory Visit (INDEPENDENT_AMBULATORY_CARE_PROVIDER_SITE_OTHER): Payer: Medicare Other | Admitting: Internal Medicine

## 2018-05-13 ENCOUNTER — Ambulatory Visit (INDEPENDENT_AMBULATORY_CARE_PROVIDER_SITE_OTHER): Payer: Medicare Other

## 2018-05-13 ENCOUNTER — Other Ambulatory Visit: Payer: Medicare Other

## 2018-05-13 VITALS — BP 160/84 | HR 65 | Temp 98.1°F | Wt 181.4 lb

## 2018-05-13 DIAGNOSIS — I1310 Hypertensive heart and chronic kidney disease without heart failure, with stage 1 through stage 4 chronic kidney disease, or unspecified chronic kidney disease: Secondary | ICD-10-CM | POA: Diagnosis not present

## 2018-05-13 DIAGNOSIS — J45901 Unspecified asthma with (acute) exacerbation: Secondary | ICD-10-CM

## 2018-05-13 DIAGNOSIS — N183 Chronic kidney disease, stage 3 unspecified: Secondary | ICD-10-CM

## 2018-05-13 DIAGNOSIS — J45909 Unspecified asthma, uncomplicated: Secondary | ICD-10-CM

## 2018-05-13 DIAGNOSIS — R0989 Other specified symptoms and signs involving the circulatory and respiratory systems: Secondary | ICD-10-CM | POA: Diagnosis not present

## 2018-05-13 DIAGNOSIS — R600 Localized edema: Secondary | ICD-10-CM | POA: Diagnosis not present

## 2018-05-13 MED ORDER — AZITHROMYCIN 250 MG PO TABS: 250.0000 mg | ORAL_TABLET | ORAL | 0 refills | Status: DC

## 2018-05-13 MED ORDER — METHYLPREDNISOLONE ACETATE 40 MG/ML IJ SUSP
40.0000 mg | Freq: Once | INTRAMUSCULAR | Status: AC
  Administered 2018-05-13: 40 mg via INTRAMUSCULAR

## 2018-05-13 MED ORDER — IPRATROPIUM-ALBUTEROL 0.5-2.5 (3) MG/3ML IN SOLN
3.0000 mL | Freq: Once | RESPIRATORY_TRACT | Status: AC
  Administered 2018-05-13: 3 mL via RESPIRATORY_TRACT

## 2018-05-13 MED ORDER — METHYLPREDNISOLONE ACETATE 80 MG/ML IJ SUSP
80.0000 mg | Freq: Once | INTRAMUSCULAR | Status: AC
  Administered 2018-05-13: 80 mg via INTRAMUSCULAR

## 2018-05-13 NOTE — Progress Notes (Signed)
Chief Complaint  Patient presents with  . chest congestion    x 1 week. productive cough light green color. Pt has been having chills     HPI: [name] 84 y.o. come in for   Cough  As above No fever but some chills   No cp new  But   She has underlying asthmatic sx and  pulm HT  Followed pulmonary  For her dyspnea   Last c xray 6 2019   Fever   Using neb  But not today   no. Hemoptysis  ROS: See pertinent positives and negatives per HPI. No vomiting diarrhea no syncope dizziness  feet that chonric edema left left more than right is  Worse  decin am but inc in pm   bp has been good at home  In the 130 range   Past Medical History:  Diagnosis Date  . Acute superficial venous thrombosis of left lower extremity 03/27/2014   concern about hx and potential extensive on exam tenderness calf and low dose lovenox 40 qd 2-4 weekselevetion and close  fu advised .   [name] Allergy   . Anemia   . Anxiety   . Arthritis    "shoulders" (09/09/2012)  . Asthma 09/08/2012  . Carotid bruit    [place] 2018  low risk 1 - 39%  . Diabetes mellitus without complication ([place])    "Borderline" per pt; being monitored.  no meds per pt  . Dyspnea 04/27/2014  . GERD (gastroesophageal reflux disease)   . Headache(784.0)    "related to my high blood pressure" (09/09/2012)  . History of DVT (deep vein thrombosis)    "RLE" (09/09/2012) coumadine cant take asa so on plavix   . HTN (hypertension) 09/08/2012  . Hyperlipidemia   . Lupus ([place])    "cured years ago" (09/09/2012)  . Other dysphagia 02/11/2013  . Syncope and collapse 01/21/2018  . Varicose veins   . Varicose veins of leg with complications 9/51/8841    Family History  Problem Relation Age of Onset  . Hypertension Mother   . Breast cancer Mother   . Hypertension Father   . Cancer Sister        Colon and Breast Cancer  . Breast cancer Sister   . Colon cancer Sister        ? 64' s dx - died in 25's  . Hypertension Brother   . Rectal cancer  Brother   . Stomach cancer Brother   . Breast cancer Brother   . Heart attack Daughter   . Esophageal cancer Daughter   . Lung cancer Unknown        both parents   . Breast cancer Paternal Aunt     Social History   Socioeconomic History  . Marital status: Single    Spouse name: Not on file  . Number of children: 6  . Years of education: Not on file  . Highest education level: Not on file  Occupational History  . Not on file  Social Needs  . Financial resource strain: Not on file  . Food insecurity:    Worry: Not on file    Inability: Not on file  . Transportation needs:    Medical: Not on file    Non-medical: Not on file  Tobacco Use  . Smoking status: Never Smoker  . Smokeless tobacco: Never Used  Substance and Sexual Activity  . Alcohol use: No  . Drug use: No  . Sexual activity:  Not on file  Lifestyle  . Physical activity:    Days per week: Not on file    Minutes per session: Not on file  . Stress: Not on file  Relationships  . Social connections:    Talks on phone: Not on file    Gets together: Not on file    Attends religious service: Not on file    Active member of club or organization: Not on file    Attends meetings of clubs or organizations: Not on file    Relationship status: Not on file  Other Topics Concern  . Not on file  Social History Narrative   2 people living in the home.  Grand daughter.   Up and down through the night      Had 7 children  2 deceased . 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    works 3 days per week currently .   Glasses dentures  Neg tad     Outpatient Medications Prior to Visit  Medication Sig Dispense Refill  . acetaminophen (TYLENOL) 500 MG tablet Take 1,000 mg by mouth every 6 (six) hours as needed for moderate pain. Reported on 04/30/2015    . albuterol (PROAIR HFA) 108 (90 Base) MCG/ACT inhaler USE 2 PUFFS EVERY 6 HOURS AS NEEDED FOR  WHEEZING. 8.5 g 1  . albuterol (PROVENTIL) (2.5 MG/3ML) 0.083% nebulizer solution USE 1 AMPULE EVERY 6 HOURS AS NEEDED FOR WHEEZE 75 mL 0  . amLODipine-olmesartan (AZOR) 10-40 MG tablet Take 1 tablet by mouth daily.    [name] atorvastatin (LIPITOR) 20 MG tablet Take 1 tablet (20 mg total) by mouth daily. 90 tablet 1  . carvedilol (COREG) 12.5 MG tablet Take 6.25 mg by mouth daily. 1/2 tablet daily    . citalopram (CELEXA) 20 MG tablet TAKE 1 TABLET ONCE DAILY. 30 tablet 5  . clopidogrel (PLAVIX) 75 MG tablet TAKE 1 TABLET ONCE DAILY. 30 tablet 2  . diclofenac sodium (VOLTAREN) 1 % GEL APPLY 4GM TO AFFECTED AREA(S) 4 TIMES A DAY. 100 g 0  . ferrous sulfate 325 (65 FE) MG EC tablet Take 1 tablet (325 mg total) by mouth daily with breakfast. 30 tablet 3  . fluorometholone (FML) 0.1 % ophthalmic suspension Place 1 drop into both eyes 2 (two) times daily.     . Fluticasone-Salmeterol (ADVAIR) 250-50 MCG/DOSE AEPB USE 1 PUFF TWICE DAILY. 60 each 6  . furosemide (LASIX) 80 MG tablet Take 80 mg in the morning and 40 mg in the evening due to persistent lower extremity edema.    . hydrALAZINE (APRESOLINE) 50 MG tablet Take 50 mg by mouth 3 (three) times daily.    [name] lidocaine (LIDODERM) 5 % Place 1 patch onto the skin daily. Remove & Discard patch within 12 hours or as directed by MD (Patient taking differently: Place 1 patch onto the skin daily as needed (pain). Remove & Discard patch within 12 hours or as directed by MD) 30 patch 0  . LORazepam (ATIVAN) 1 MG tablet Take 1 tablet (1 mg total) by mouth every 8 (eight) hours as needed for anxiety. Avoid regular use 24 tablet 0  . montelukast (SINGULAIR) 10 MG tablet TAKE 1 TABLET ONCE DAILY. 90 tablet 1  . pantoprazole (PROTONIX) 40 MG tablet TAKE (1) TABLET TWICE A DAY BEFORE MEALS. 60 tablet 0  . potassium chloride (K-DUR) 10 MEQ tablet TAKE 4  TABLETS DAILY. 120 tablet 0  . potassium chloride (K-DUR,KLOR-CON) 10 MEQ tablet Take 10 mEq by mouth 4 (four) times  daily.    . RESTASIS 0.05 % ophthalmic emulsion Place 1 drop into both eyes 2 (two) times daily.     [name] Spacer/Aero-Holding Chambers (AEROCHAMBER PLUS) inhaler Use as instructed 1 each 2  . vitamin B-12 (CYANOCOBALAMIN) 1000 MCG tablet Take 1,000 mcg by mouth daily.    [name] doxazosin (CARDURA) 2 MG tablet TAKE 3 TABLETS AT BEDTIME. 90 tablet 0   No facility-administered medications prior to visit.      EXAM:  BP (!) 160/84 (BP Location: Left Arm, Patient Position: Sitting, Cuff Size: Normal)   Pulse 65   Temp 98.1 F (36.7 C) (Oral)   Wt 181 lb 6.4 oz (82.3 kg)   SpO2 95%   BMI 33.72 kg/m   Body mass index is 33.72 kg/m.  GENERAL: vitals reviewed and listed above, alert, oriented, appears well hydrated and in no acute distress non toxic  ocass cough HEENT: atraumatic, conjunctiva  clear, no obvious abnormalities on inspection of external nose and ears OP : no lesion edema or exudate  Tm clear NECK: no obvious masses on inspection palpation  LUNGS: dec air movement    ocass wheezing   no rales  After duoneb  Inc bs and no rales  CV: HRRR, no clubbing cyanosis  nl cap refill  lle 2-3+ edema  Right  1-2 +  Has compression stocking  MS: moves all extremities without noticeable focal  abnormality PSYCH: pleasant and cooperative, no obvious depression or anxiety Lab Results  Component Value Date   WBC 4.0 03/25/2018   HGB 9.5 (L) 03/25/2018   HCT 28.4 (L) 03/25/2018   PLT 186.0 03/25/2018   GLUCOSE 116 (H) 03/25/2018   CHOL 177 03/25/2018   TRIG 72.0 03/25/2018   HDL 73.60 03/25/2018   LDLCALC 89 03/25/2018   ALT 6 03/25/2018   AST 18 03/25/2018   NA 135 03/25/2018   K 4.1 03/25/2018   CL 97 03/25/2018   CREATININE 2.16 (H) 03/25/2018   BUN 39 (H) 03/25/2018   CO2 29 03/25/2018   TSH 1.31 07/09/2017   INR 0.94 12/22/2009   HGBA1C 5.8 03/25/2018   MICROALBUR 10.7 (H) 04/25/2016   BP Readings from Last 3 Encounters:  05/13/18 (!) 160/84  04/23/18 (!) 152/52  03/25/18 (!)  122/54    ASSESSMENT AND PLAN:  Discussed the following assessment and plan:  Exacerbation of asthma, unspecified asthma severity, unspecified whether persistent - Plan: DG Chest 2 View  Chronic asthma without complication, unspecified asthma severity, unspecified whether persistent - Plan: DG Chest 2 View, ipratropium-albuterol (DUONEB) 0.5-2.5 (3) MG/3ML nebulizer solution 3 mL, methylPREDNISolone acetate (DEPO-MEDROL) injection 80 mg, methylPREDNISolone acetate (DEPO-MEDROL) injection 40 mg  Leg edema  CKD (chronic kidney disease) stage 3, GFR 30-59 ml/min (HCC)  Erroneous encounter - disregard  Hypertensive heart disease with hypertensive chronic kidney disease, unspecified CKD stage, unspecified whether heart failure present   Hx of same remote hx of dvt left  ? Has seen vascular in past      Has renal insuff and  pulm ht on echo but I dont think cough sob from cv decpensation  Try  Inc lasex 80 bid for a week to assess.  High risk    Secondary infection  depomedrol and antibiotic and cont nebs   Infection triggered  -Patient advised to return or notify health care team  if  new  concerns arise.  Patient Instructions   Asthma flare  Aggravated most likely by infection.   depomedrol  Today   Use nebulizer   Every 6 hours as needed.  Antibiotic   If not improving see dr Lake Bells or his team  For next step   Swelling     Related to the past   Blood clot and then fluid retention .  ?   Try  Taking Lasix 80 mg twice a day for a week  .    Standley Brooking. Panosh M.D.

## 2018-05-13 NOTE — Patient Instructions (Addendum)
  Asthma flare  Aggravated most likely by infection.   depomedrol  Today   Use nebulizer   Every 6 hours as needed.  Antibiotic   If not improving see dr Lake Bells or his team  For next step   Swelling     Related to the past   Blood clot and then fluid retention .  ?   Try  Taking Lasix 80 mg twice a day for a week  .

## 2018-05-16 ENCOUNTER — Other Ambulatory Visit: Payer: Self-pay | Admitting: Internal Medicine

## 2018-05-16 NOTE — Telephone Encounter (Signed)
Letter printed and mailed.  Nothing further needed.

## 2018-05-19 ENCOUNTER — Encounter: Payer: Self-pay | Admitting: Internal Medicine

## 2018-05-29 DIAGNOSIS — I739 Peripheral vascular disease, unspecified: Secondary | ICD-10-CM | POA: Diagnosis not present

## 2018-05-29 DIAGNOSIS — I129 Hypertensive chronic kidney disease with stage 1 through stage 4 chronic kidney disease, or unspecified chronic kidney disease: Secondary | ICD-10-CM | POA: Diagnosis not present

## 2018-05-29 DIAGNOSIS — N183 Chronic kidney disease, stage 3 (moderate): Secondary | ICD-10-CM | POA: Diagnosis not present

## 2018-05-29 DIAGNOSIS — J45909 Unspecified asthma, uncomplicated: Secondary | ICD-10-CM | POA: Diagnosis not present

## 2018-05-29 DIAGNOSIS — D631 Anemia in chronic kidney disease: Secondary | ICD-10-CM | POA: Diagnosis not present

## 2018-05-29 LAB — BASIC METABOLIC PANEL
BUN: 20 (ref 4–21)
Creatinine: 1.9 — AB (ref 0.5–1.1)
Glucose: 127
Potassium: 4.2 (ref 3.4–5.3)
Sodium: 136 — AB (ref 137–147)

## 2018-06-05 ENCOUNTER — Other Ambulatory Visit: Payer: Self-pay | Admitting: Internal Medicine

## 2018-06-06 ENCOUNTER — Other Ambulatory Visit: Payer: Self-pay | Admitting: Internal Medicine

## 2018-06-17 ENCOUNTER — Other Ambulatory Visit: Payer: Self-pay | Admitting: Internal Medicine

## 2018-07-10 ENCOUNTER — Other Ambulatory Visit: Payer: Self-pay | Admitting: Internal Medicine

## 2018-07-12 ENCOUNTER — Other Ambulatory Visit: Payer: Self-pay | Admitting: Internal Medicine

## 2018-07-15 NOTE — Telephone Encounter (Signed)
Last filled:12/14/17 Last OV:05/13/18 Upcoming appt:07/22/2018

## 2018-07-18 ENCOUNTER — Other Ambulatory Visit: Payer: Self-pay | Admitting: Internal Medicine

## 2018-07-22 ENCOUNTER — Ambulatory Visit: Payer: Medicare Other | Admitting: Internal Medicine

## 2018-07-22 NOTE — Progress Notes (Deleted)
No chief complaint on file.   HPI: [name] 85 y.o. come in for Chronic disease management   Last  Check fall 19  atorva  Given 9 26  Last check [place]  BP  Edema  Pulm ht  pulm status  Last a1c was good  ROS: See pertinent positives and negatives per HPI.  Past Medical History:  Diagnosis Date  . Acute superficial venous thrombosis of left lower extremity 03/27/2014   concern about hx and potential extensive on exam tenderness calf and low dose lovenox 40 qd 2-4 weekselevetion and close  fu advised .   [name] Allergy   . Anemia   . Anxiety   . Arthritis    "shoulders" (09/09/2012)  . Asthma 09/08/2012  . Carotid bruit    [place] 2018  low risk 1 - 39%  . Diabetes mellitus without complication ([place])    "Borderline" per pt; being monitored.  no meds per pt  . Dyspnea 04/27/2014  . GERD (gastroesophageal reflux disease)   . Headache(784.0)    "related to my high blood pressure" (09/09/2012)  . History of DVT (deep vein thrombosis)    "RLE" (09/09/2012) coumadine cant take asa so on plavix   . HTN (hypertension) 09/08/2012  . Hyperlipidemia   . Lupus ([place])    "cured years ago" (09/09/2012)  . Other dysphagia 02/11/2013  . Syncope and collapse 01/21/2018  . Varicose veins   . Varicose veins of leg with complications 9/37/9024    Family History  Problem Relation Age of Onset  . Hypertension Mother   . Breast cancer Mother   . Hypertension Father   . Cancer Sister        Colon and Breast Cancer  . Breast cancer Sister   . Colon cancer Sister        ? 29' s dx - died in 55's  . Hypertension Brother   . Rectal cancer Brother   . Stomach cancer Brother   . Breast cancer Brother   . Heart attack Daughter   . Esophageal cancer Daughter   . Lung cancer Unknown        both parents   . Breast cancer Paternal Aunt     Social History   Socioeconomic History  . Marital status: Single    Spouse name: Not on file  . Number of children: 6  . Years of education:  Not on file  . Highest education level: Not on file  Occupational History  . Not on file  Social Needs  . Financial resource strain: Not on file  . Food insecurity:    Worry: Not on file    Inability: Not on file  . Transportation needs:    Medical: Not on file    Non-medical: Not on file  Tobacco Use  . Smoking status: Never Smoker  . Smokeless tobacco: Never Used  Substance and Sexual Activity  . Alcohol use: No  . Drug use: No  . Sexual activity: Not on file  Lifestyle  . Physical activity:    Days per week: Not on file    Minutes per session: Not on file  . Stress: Not on file  Relationships  . Social connections:    Talks on phone: Not on file    Gets together: Not on file    Attends religious service: Not on file    Active member of club or organization: Not on file    Attends meetings of clubs or  organizations: Not on file    Relationship status: Not on file  Other Topics Concern  . Not on file  Social History Narrative   2 people living in the home.  Grand daughter.   Up and down through the night      Had 7 children  2 deceased . 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    works 3 days per week currently .   Glasses dentures  Neg tad     Outpatient Medications Prior to Visit  Medication Sig Dispense Refill  . acetaminophen (TYLENOL) 500 MG tablet Take 1,000 mg by mouth every 6 (six) hours as needed for moderate pain. Reported on 04/30/2015    . albuterol (PROAIR HFA) 108 (90 Base) MCG/ACT inhaler USE 2 PUFFS EVERY 6 HOURS AS NEEDED FOR WHEEZING. 8.5 g 2  . albuterol (PROVENTIL) (2.5 MG/3ML) 0.083% nebulizer solution USE 1 AMPULE EVERY 6 HOURS AS NEEDED FOR WHEEZE 75 mL 0  . amLODipine-olmesartan (AZOR) 10-40 MG tablet Take 1 tablet by mouth daily.    [name] atorvastatin (LIPITOR) 20 MG tablet Take 1 tablet (20 mg total) by mouth daily. 90 tablet 1  . azithromycin (ZITHROMAX Z-PAK) 250 MG  tablet Take 1 tablet (250 mg total) by mouth as directed. Take 2 po first day, then 1 po qd 6 each 0  . carvedilol (COREG) 12.5 MG tablet Take 6.25 mg by mouth daily. 1/2 tablet daily    . citalopram (CELEXA) 20 MG tablet TAKE 1 TABLET ONCE DAILY. 90 tablet 1  . clopidogrel (PLAVIX) 75 MG tablet TAKE 1 TABLET ONCE DAILY. 30 tablet 2  . diclofenac sodium (VOLTAREN) 1 % GEL APPLY 4GM TO AFFECTED AREA(S) 4 TIMES A DAY. 100 g 0  . doxazosin (CARDURA) 2 MG tablet TAKE 3 TABLETS AT BEDTIME. 90 tablet 0  . ferrous sulfate 325 (65 FE) MG EC tablet Take 1 tablet (325 mg total) by mouth daily with breakfast. 30 tablet 3  . fluorometholone (FML) 0.1 % ophthalmic suspension Place 1 drop into both eyes 2 (two) times daily.     . Fluticasone-Salmeterol (ADVAIR) 250-50 MCG/DOSE AEPB USE 1 PUFF TWICE DAILY. 60 each 6  . furosemide (LASIX) 80 MG tablet Take 80 mg in the morning and 40 mg in the evening due to persistent lower extremity edema.    . hydrALAZINE (APRESOLINE) 50 MG tablet Take 50 mg by mouth 3 (three) times daily.    [name] lidocaine (LIDODERM) 5 % Place 1 patch onto the skin daily. Remove & Discard patch within 12 hours or as directed by MD (Patient taking differently: Place 1 patch onto the skin daily as needed (pain). Remove & Discard patch within 12 hours or as directed by MD) 30 patch 0  . LORazepam (ATIVAN) 1 MG tablet Take 1 tablet (1 mg total) by mouth every 8 (eight) hours as needed for anxiety. Avoid regular use 24 tablet 0  . montelukast (SINGULAIR) 10 MG tablet TAKE 1 TABLET ONCE DAILY. 90 tablet 1  . pantoprazole (PROTONIX) 40 MG tablet TAKE (1) TABLET TWICE A DAY BEFORE MEALS. 180 tablet 0  . potassium chloride (K-DUR) 10 MEQ tablet TAKE 4 TABLETS DAILY. 120 tablet 0  . potassium chloride (K-DUR,KLOR-CON) 10 MEQ tablet Take 10 mEq by mouth 4 (four) times daily.    . RESTASIS 0.05 % ophthalmic emulsion Place 1 drop into both eyes 2 (two) times  daily.     . Spacer/Aero-Holding Chambers  (AEROCHAMBER PLUS) inhaler Use as instructed 1 each 2  . vitamin B-12 (CYANOCOBALAMIN) 1000 MCG tablet Take 1,000 mcg by mouth daily.     No facility-administered medications prior to visit.      EXAM:  There were no vitals taken for this visit.  There is no height or weight on file to calculate BMI.  GENERAL: vitals reviewed and listed above, alert, oriented, appears well hydrated and in no acute distress HEENT: atraumatic, conjunctiva  clear, no obvious abnormalities on inspection of external nose and ears OP : no lesion edema or exudate  NECK: no obvious masses on inspection palpation  LUNGS: clear to auscultation bilaterally, no wheezes, rales or rhonchi, good air movement CV: HRRR, no clubbing cyanosis or  peripheral edema nl cap refill  MS: moves all extremities without noticeable focal  abnormality PSYCH: pleasant and cooperative, no obvious depression or anxiety Lab Results  Component Value Date   WBC 4.0 03/25/2018   HGB 9.5 (L) 03/25/2018   HCT 28.4 (L) 03/25/2018   PLT 186.0 03/25/2018   GLUCOSE 116 (H) 03/25/2018   CHOL 177 03/25/2018   TRIG 72.0 03/25/2018   HDL 73.60 03/25/2018   LDLCALC 89 03/25/2018   ALT 6 03/25/2018   AST 18 03/25/2018   NA 135 03/25/2018   K 4.1 03/25/2018   CL 97 03/25/2018   CREATININE 2.16 (H) 03/25/2018   BUN 39 (H) 03/25/2018   CO2 29 03/25/2018   TSH 1.31 07/09/2017   INR 0.94 12/22/2009   HGBA1C 5.8 03/25/2018   MICROALBUR 10.7 (H) 04/25/2016   BP Readings from Last 3 Encounters:  05/13/18 (!) 160/84  04/23/18 (!) 152/52  03/25/18 (!) 122/54   Wt Readings from Last 3 Encounters:  05/13/18 181 lb 6.4 oz (82.3 kg)  04/23/18 179 lb 6.4 oz (81.4 kg)  03/25/18 179 lb 6.4 oz (81.4 kg)     ASSESSMENT AND PLAN:  Discussed the following assessment and plan:  Medication management  Renal insufficiency creatinine 1.3  Essential hypertension  Hypertensive heart disease with hypertensive chronic kidney disease,  unspecified CKD stage, unspecified whether heart failure present  Hyperlipidemia, unspecified hyperlipidemia type  Anemia, chronic disease  Pre-diabetes vs early DM  bg renal anemia ? -Patient advised to return or notify health care team  if  new concerns arise.  There are no Patient Instructions on file for this visit.   Standley Brooking. Raea Magallon M.D.

## 2018-08-02 ENCOUNTER — Ambulatory Visit (HOSPITAL_COMMUNITY)
Admission: EM | Admit: 2018-08-02 | Discharge: 2018-08-02 | Disposition: A | Discharge status: Home / Self Care | Payer: Medicare Other | Attending: Internal Medicine | Admitting: Internal Medicine

## 2018-08-02 ENCOUNTER — Other Ambulatory Visit: Payer: Self-pay

## 2018-08-02 DIAGNOSIS — J4521 Mild intermittent asthma with (acute) exacerbation: Secondary | ICD-10-CM | POA: Diagnosis not present

## 2018-08-02 MED ORDER — METHYLPREDNISOLONE SODIUM SUCC 125 MG IJ SOLR
INTRAMUSCULAR | Status: AC
  Filled 2018-08-02: qty 2

## 2018-08-02 MED ORDER — PREDNISONE 10 MG (21) PO TBPK: ORAL_TABLET | Freq: Every day | ORAL | 0 refills | Status: DC

## 2018-08-02 MED ORDER — METHYLPREDNISOLONE SODIUM SUCC 125 MG IJ SOLR
125.0000 mg | Freq: Once | INTRAMUSCULAR | Status: AC
  Administered 2018-08-02: 125 mg via INTRAMUSCULAR

## 2018-08-02 MED ORDER — AZITHROMYCIN 250 MG PO TABS: 250.0000 mg | ORAL_TABLET | Freq: Every day | ORAL | 0 refills | Status: DC

## 2018-08-02 NOTE — ED Triage Notes (Signed)
Pt has history of asthma and since Monday has had a cough with nasal drainage. No fevers nor chills. Pt says sometimes she has sob when her her asthma flare up.

## 2018-08-02 NOTE — ED Provider Notes (Signed)
[place]    CSN: [id number] Arrival date & time: 08/02/18  1916     History   Chief Complaint Chief Complaint  Patient presents with  . Cough  . Nasal Congestion    HPI [name] is a 85 y.o. female.   Patient with past medical history of asthma, hypertension, chronic kidney disease and metabolic syndrome presents to urgent care complaining of cough and a tickle in her throat.  She reports that her cough keeps her up at night and that occasionally she has a fit of coughing that makes her short of breath.  She denies dyspnea at this time.  She also denies orthopnea, swelling in her feet or paroxysmal nocturnal dyspnea.     Past Medical History:  Diagnosis Date  . Acute superficial venous thrombosis of left lower extremity 03/27/2014   concern about hx and potential extensive on exam tenderness calf and low dose lovenox 40 qd 2-4 weekselevetion and close  fu advised .   [name] Allergy   . Anemia   . Anxiety   . Arthritis    "shoulders" (09/09/2012)  . Asthma 09/08/2012  . Carotid bruit    [place] 2018  low risk 1 - 39%  . Diabetes mellitus without complication ([place])    "Borderline" per pt; being monitored.  no meds per pt  . Dyspnea 04/27/2014  . GERD (gastroesophageal reflux disease)   . Headache(784.0)    "related to my high blood pressure" (09/09/2012)  . History of DVT (deep vein thrombosis)    "RLE" (09/09/2012) coumadine cant take asa so on plavix   . HTN (hypertension) 09/08/2012  . Hyperlipidemia   . Lupus ([place])    "cured years ago" (09/09/2012)  . Other dysphagia 02/11/2013  . Syncope and collapse 01/21/2018  . Varicose veins   . Varicose veins of leg with complications 8/58/8502    Patient Active Problem List   Diagnosis Date Noted  . Hypertensive heart disease with hypertensive chronic kidney disease 11/30/2017  . Fasting hyperglycemia 11/30/2017  . Renal cyst 03/27/2016  . CKD (chronic kidney disease) stage 3, GFR 30-59 ml/min (HCC) 08/23/2015   . Perceived hearing changes 06/29/2014  . Resistant hypertension 06/29/2014  . Lumbago 06/15/2014  . Pre-diabetes vs early DM  06/15/2014  . Dyspnea 04/27/2014  . Asthma, chronic 04/13/2014  . Elevated uric acid in blood 04/13/2014  . Essential hypertension 03/27/2014  . History of DVT (deep vein thrombosis)   . Palpitations 01/05/2014  . Anemia of chronic disease 12/27/2013  . Death of family member 2013/08/31  . Leg edema 06/20/2013  . Bereavement due to life event 04/21/2013  . Other dysphagia 02/11/2013  . Colon cancer screening 02/11/2013  . Anxiety state 01/20/2013  . Leg cramps 01/20/2013  . Back pain 12/05/2012  . Family hx of colon cancer 10/21/2012  . Family hx of lung cancer 10/21/2012  . Family hx-breast malignancy 10/21/2012  . Renal insufficiency creatinine 1.3 10/21/2012  . Anemia, chronic disease 10/21/2012  . GERD (gastroesophageal reflux disease) 09/08/2012  . HTN (hypertension) 09/08/2012  . Hyperlipidemia 09/08/2012  . Asthma 09/08/2012  . Varicose veins of leg with complications 77/41/2878    Past Surgical History:  Procedure Laterality Date  . ABDOMINAL HYSTERECTOMY     partial  . BREAST EXCISIONAL BIOPSY Right   . BREAST SURGERY    . CARPAL TUNNEL RELEASE Right 2000's  . CARPAL TUNNEL RELEASE Bilateral 10/21/2013   Procedure: BILATERAL  CARPAL TUNNEL RELEASE;  Surgeon: Dominica Severin  Beola Cord, MD;  Location: [facility];  Service: Orthopedics;  Laterality: Bilateral;  . SHOULDER OPEN ROTATOR CUFF REPAIR Bilateral 2000's  . TRIGGER FINGER RELEASE Right 10/21/2013   Procedure: RELEASE TRIGGER FINGER/A-1 PULLEY RIGHT MIDDLE AND RIGHT RING;  Surgeon: [name], MD;  Location: [facility];  Service: Orthopedics;  Laterality: Right;    OB History    Gravida  7   Para  7   Term      Preterm      AB      Living        SAB      TAB      Ectopic      Multiple      Live Births           Obstetric Comments  Lost 2  children soon after birth         Home Medications    Prior to Admission medications   Medication Sig Start Date End Date Taking? Authorizing Provider  acetaminophen (TYLENOL) 500 MG tablet Take 1,000 mg by mouth every 6 (six) hours as needed for moderate pain. Reported on 04/30/2015    [provider]  albuterol (PROAIR HFA) 108 (90 Base) MCG/ACT inhaler USE 2 PUFFS EVERY 6 HOURS AS NEEDED FOR WHEEZING. 06/06/18   Panosh, Standley Brooking, MD  albuterol (PROVENTIL) (2.5 MG/3ML) 0.083% nebulizer solution USE 1 AMPULE EVERY 6 HOURS AS NEEDED FOR WHEEZE 10/23/17   Panosh, Standley Brooking, MD  amLODipine-olmesartan (AZOR) 10-40 MG tablet Take 1 tablet by mouth daily.    Madelon Lips, MD  atorvastatin (LIPITOR) 20 MG tablet Take 1 tablet (20 mg total) by mouth daily. 02/07/18   Panosh, Standley Brooking, MD  azithromycin (ZITHROMAX) 250 MG tablet Take 1 tablet (250 mg total) by mouth daily. Take first 2 tablets together, then 1 every day until finished. 08/02/18   Harrie Foreman, MD  carvedilol (COREG) 12.5 MG tablet Take 6.25 mg by mouth daily. 1/2 tablet daily    Madelon Lips, MD  citalopram (CELEXA) 20 MG tablet TAKE 1 TABLET ONCE DAILY. 07/15/18   Panosh, Standley Brooking, MD  clopidogrel (PLAVIX) 75 MG tablet TAKE 1 TABLET ONCE DAILY. 04/23/18   Panosh, Standley Brooking, MD  diclofenac sodium (VOLTAREN) 1 % GEL APPLY 4GM TO AFFECTED AREA(S) 4 TIMES A DAY. 07/13/17   Panosh, Standley Brooking, MD  doxazosin (CARDURA) 2 MG tablet TAKE 3 TABLETS AT BEDTIME. 07/19/18   Panosh, Standley Brooking, MD  ferrous sulfate 325 (65 FE) MG EC tablet Take 1 tablet (325 mg total) by mouth daily with breakfast. 12/15/14   Panosh, Standley Brooking, MD  fluorometholone (FML) 0.1 % ophthalmic suspension Place 1 drop into both eyes 2 (two) times daily.  09/13/17   [provider]  Fluticasone-Salmeterol (ADVAIR) 250-50 MCG/DOSE AEPB USE 1 PUFF TWICE DAILY. 01/31/18   Panosh, Standley Brooking, MD  furosemide (LASIX) 80 MG tablet Take 80 mg in the morning and 40 mg in the evening  due to persistent lower extremity edema.    Madelon Lips, MD  hydrALAZINE (APRESOLINE) 50 MG tablet Take 50 mg by mouth 3 (three) times daily.    Madelon Lips, MD  lidocaine (LIDODERM) 5 % Place 1 patch onto the skin daily. Remove & Discard patch within 12 hours or as directed by MD Patient taking differently: Place 1 patch onto the skin daily as needed (pain). Remove & Discard patch within 12 hours or as directed by MD  01/04/17   Lysbeth Penner, FNP  LORazepam (ATIVAN) 1 MG tablet Take 1 tablet (1 mg total) by mouth every 8 (eight) hours as needed for anxiety. Avoid regular use 04/23/18   Panosh, Standley Brooking, MD  montelukast (SINGULAIR) 10 MG tablet TAKE 1 TABLET ONCE DAILY. 04/23/18   Panosh, Standley Brooking, MD  pantoprazole (PROTONIX) 40 MG tablet TAKE (1) TABLET TWICE A DAY BEFORE MEALS. 06/06/18   Panosh, Standley Brooking, MD  potassium chloride (K-DUR) 10 MEQ tablet TAKE 4 TABLETS DAILY. 07/10/18   Panosh, Standley Brooking, MD  potassium chloride (K-DUR,KLOR-CON) 10 MEQ tablet Take 10 mEq by mouth 4 (four) times daily.    [provider]  predniSONE (STERAPRED UNI-PAK 21 TAB) 10 MG (21) TBPK tablet Take by mouth daily. Take 6 tabs by mouth daily  for 2 days, then 5 tabs for 2 days, then 4 tabs for 2 days, and so on until done 08/02/18   Harrie Foreman, MD  RESTASIS 0.05 % ophthalmic emulsion Place 1 drop into both eyes 2 (two) times daily.  01/01/15   [provider]  Spacer/Aero-Holding Chambers (AEROCHAMBER PLUS) inhaler Use as instructed 05/26/16   Melynda Ripple, MD  vitamin B-12 (CYANOCOBALAMIN) 1000 MCG tablet Take 1,000 mcg by mouth daily.    [provider]    Family History Family History  Problem Relation Age of Onset  . Hypertension Mother   . Breast cancer Mother   . Hypertension Father   . Cancer Sister        Colon and Breast Cancer  . Breast cancer Sister   . Colon cancer Sister        ? 56' s dx - died in 54's  . Hypertension Brother   . Rectal cancer  Brother   . Stomach cancer Brother   . Breast cancer Brother   . Heart attack Daughter   . Esophageal cancer Daughter   . Lung cancer Unknown        both parents   . Breast cancer Paternal Aunt     Social History Social History   Tobacco Use  . Smoking status: Never Smoker  . Smokeless tobacco: Never Used  Substance Use Topics  . Alcohol use: No  . Drug use: No     Allergies   Penicillins and Aspirin   Review of Systems Review of Systems  Constitutional: Negative for chills and fever.  HENT: Negative for sore throat and tinnitus.   Eyes: Negative for redness.  Respiratory: Positive for cough and wheezing (+/- (subjective)). Negative for shortness of breath.   Cardiovascular: Negative for chest pain and palpitations.  Gastrointestinal: Negative for abdominal pain, diarrhea, nausea and vomiting.  Genitourinary: Negative for dysuria, frequency and urgency.  Musculoskeletal: Negative for myalgias.  Skin: Negative for rash.       No lesions  Neurological: Negative for weakness.  Hematological: Does not bruise/bleed easily.  Psychiatric/Behavioral: Negative for suicidal ideas.     Physical Exam Triage Vital Signs ED Triage Vitals  Enc Vitals Group     BP 08/02/18 1937 (!) 148/70     Pulse Rate 08/02/18 1937 67     Resp 08/02/18 1937 16     Temp 08/02/18 1937 99.3 F (37.4 C)     Temp Source 08/02/18 1937 Oral     SpO2 08/02/18 1937 98 %     Weight --      Height --      Head Circumference --  Peak Flow --      Pain Score 08/02/18 1936 0     Pain Loc --      Pain Edu? --      Excl. in [place]? --    No data found.  Updated Vital Signs BP (!) 148/70 (BP Location: Right Arm)   Pulse 67   Temp 99.3 F (37.4 C) (Oral)   Resp 16   SpO2 98%   Visual Acuity Right Eye Distance:   Left Eye Distance:   Bilateral Distance:    Right Eye Near:   Left Eye Near:    Bilateral Near:     Physical Exam Vitals signs and nursing note reviewed.  Constitutional:       General: She is not in acute distress.    Appearance: She is well-developed.  HENT:     Head: Normocephalic and atraumatic.  Eyes:     General: No scleral icterus.    Conjunctiva/sclera: Conjunctivae normal.     Pupils: Pupils are equal, round, and reactive to light.  Neck:     Musculoskeletal: Normal range of motion and neck supple.     Thyroid: No thyromegaly.     Vascular: No JVD.     Trachea: No tracheal deviation.  Cardiovascular:     Rate and Rhythm: Normal rate and regular rhythm.     Heart sounds: Normal heart sounds. No murmur. No friction rub. No gallop.   Pulmonary:     Effort: Pulmonary effort is normal.     Breath sounds: Normal breath sounds.  Abdominal:     General: Bowel sounds are normal. There is no distension.     Palpations: Abdomen is soft.     Tenderness: There is no abdominal tenderness.  Musculoskeletal: Normal range of motion.  Lymphadenopathy:     Cervical: No cervical adenopathy.  Skin:    General: Skin is warm and dry.  Neurological:     Mental Status: She is alert and oriented to person, place, and time.     Cranial Nerves: No cranial nerve deficit.  Psychiatric:        Behavior: Behavior normal.        Thought Content: Thought content normal.        Judgment: Judgment normal.      UC Treatments / Results  Labs (all labs ordered are listed, but only abnormal results are displayed) Labs Reviewed - No data to display  EKG None  Radiology No results found.  Procedures Procedures (including critical care time)  Medications Ordered in UC Medications  methylPREDNISolone sodium succinate (SOLU-MEDROL) 125 mg/2 mL injection 125 mg (125 mg Intramuscular Given 08/02/18 2010)    Initial Impression / Assessment and Plan / UC Course  I have reviewed the triage vital signs and the nursing notes.  Pertinent labs & imaging results that were available during my care of the patient were reviewed by me and considered in my medical decision  making (see chart for details).     Cough consistent with mild intermittent asthma exacerbation secondary to seasonal allergies.  The patient takes Zyrtec at home but usually responds well to a steroid taper.  Also prescribe azithromycin due to scratchiness in her throat which could represent atypical infection also flaring asthma.  Final Clinical Impressions(s) / UC Diagnoses   Final diagnoses:  Mild intermittent asthma with acute exacerbation   Discharge Instructions   None    ED Prescriptions    Medication Sig Dispense Auth. Provider   azithromycin [facility])  250 MG tablet Take 1 tablet (250 mg total) by mouth daily. Take first 2 tablets together, then 1 every day until finished. 6 tablet Harrie Foreman, MD   predniSONE (STERAPRED UNI-PAK 21 TAB) 10 MG (21) TBPK tablet Take by mouth daily. Take 6 tabs by mouth daily  for 2 days, then 5 tabs for 2 days, then 4 tabs for 2 days, and so on until done 42 tablet Harrie Foreman, MD     Controlled Substance Prescriptions [place] Controlled Substance Registry consulted? Not Applicable   Harrie Foreman, MD 08/02/18 2017

## 2018-08-06 ENCOUNTER — Other Ambulatory Visit: Payer: Self-pay | Admitting: Internal Medicine

## 2018-08-07 DIAGNOSIS — J45909 Unspecified asthma, uncomplicated: Secondary | ICD-10-CM | POA: Diagnosis not present

## 2018-08-07 DIAGNOSIS — I129 Hypertensive chronic kidney disease with stage 1 through stage 4 chronic kidney disease, or unspecified chronic kidney disease: Secondary | ICD-10-CM | POA: Diagnosis not present

## 2018-08-07 DIAGNOSIS — N184 Chronic kidney disease, stage 4 (severe): Secondary | ICD-10-CM | POA: Diagnosis not present

## 2018-08-09 ENCOUNTER — Other Ambulatory Visit: Payer: Self-pay | Admitting: Internal Medicine

## 2018-08-20 ENCOUNTER — Other Ambulatory Visit: Payer: Self-pay | Admitting: Internal Medicine

## 2018-08-23 ENCOUNTER — Other Ambulatory Visit: Payer: Self-pay | Admitting: Internal Medicine

## 2018-09-05 DIAGNOSIS — N183 Chronic kidney disease, stage 3 unspecified: Secondary | ICD-10-CM

## 2018-09-05 DIAGNOSIS — I1310 Hypertensive heart and chronic kidney disease without heart failure, with stage 1 through stage 4 chronic kidney disease, or unspecified chronic kidney disease: Secondary | ICD-10-CM

## 2018-09-05 DIAGNOSIS — N289 Disorder of kidney and ureter, unspecified: Secondary | ICD-10-CM

## 2018-09-05 DIAGNOSIS — R7303 Prediabetes: Secondary | ICD-10-CM

## 2018-09-05 DIAGNOSIS — D638 Anemia in other chronic diseases classified elsewhere: Secondary | ICD-10-CM

## 2018-09-06 ENCOUNTER — Other Ambulatory Visit: Payer: Self-pay | Admitting: Internal Medicine

## 2018-09-06 NOTE — Telephone Encounter (Signed)
I have put in orders for lab work and she can  Come in if no sx and screening for lab.   Then we can do a video visit with results   ( get [place] a weight and Bp reading  For the visit )  thanks

## 2018-09-14 ENCOUNTER — Other Ambulatory Visit: Payer: Self-pay | Admitting: Internal Medicine

## 2018-09-21 ENCOUNTER — Other Ambulatory Visit: Payer: Self-pay | Admitting: Internal Medicine

## 2018-09-23 NOTE — Telephone Encounter (Signed)
Please advise 

## 2018-09-24 NOTE — Telephone Encounter (Signed)
Sent in electronically .  

## 2018-10-02 DIAGNOSIS — J45909 Unspecified asthma, uncomplicated: Secondary | ICD-10-CM | POA: Diagnosis not present

## 2018-10-02 DIAGNOSIS — N184 Chronic kidney disease, stage 4 (severe): Secondary | ICD-10-CM | POA: Diagnosis not present

## 2018-10-02 DIAGNOSIS — I129 Hypertensive chronic kidney disease with stage 1 through stage 4 chronic kidney disease, or unspecified chronic kidney disease: Secondary | ICD-10-CM | POA: Diagnosis not present

## 2018-10-02 LAB — BASIC METABOLIC PANEL
BUN: 32 — AB (ref 4–21)
Creatinine: 1.9 — AB (ref 0.5–1.1)
Glucose: 128
Potassium: 4.2 (ref 3.4–5.3)
Sodium: 128 — AB (ref 137–147)

## 2018-10-03 ENCOUNTER — Encounter: Payer: Self-pay | Admitting: Internal Medicine

## 2018-10-08 ENCOUNTER — Other Ambulatory Visit: Payer: Self-pay | Admitting: Internal Medicine

## 2018-10-09 ENCOUNTER — Other Ambulatory Visit: Payer: Self-pay | Admitting: Internal Medicine

## 2018-10-09 DIAGNOSIS — Z9289 Personal history of other medical treatment: Secondary | ICD-10-CM

## 2018-10-10 ENCOUNTER — Encounter: Payer: Self-pay | Admitting: Internal Medicine

## 2018-10-17 DIAGNOSIS — N184 Chronic kidney disease, stage 4 (severe): Secondary | ICD-10-CM | POA: Diagnosis not present

## 2018-10-24 ENCOUNTER — Other Ambulatory Visit: Payer: Self-pay | Admitting: Internal Medicine

## 2018-10-30 ENCOUNTER — Other Ambulatory Visit: Payer: Self-pay

## 2018-10-30 ENCOUNTER — Ambulatory Visit (HOSPITAL_COMMUNITY)
Admission: EM | Admit: 2018-10-30 | Discharge: 2018-10-30 | Disposition: A | Discharge status: Home / Self Care | Payer: Medicare Other | Attending: Internal Medicine | Admitting: Internal Medicine

## 2018-10-30 ENCOUNTER — Encounter (HOSPITAL_COMMUNITY): Payer: Self-pay

## 2018-10-30 DIAGNOSIS — K521 Toxic gastroenteritis and colitis: Secondary | ICD-10-CM | POA: Diagnosis not present

## 2018-10-30 MED ORDER — ONDANSETRON 4 MG PO TBDP: 4.0000 mg | ORAL_TABLET | Freq: Three times a day (TID) | ORAL | 0 refills | Status: DC | PRN

## 2018-10-30 MED ORDER — LACTINEX PO CHEW: 1.0000 | CHEWABLE_TABLET | Freq: Three times a day (TID) | ORAL | 0 refills | Status: DC

## 2018-10-30 NOTE — ED Provider Notes (Signed)
[place]    CSN: [id number] Arrival date & time: 10/30/18  1417      History   Chief Complaint Chief Complaint  Patient presents with  . Nausea  . Vomiting  . Diarrhea    HPI [name] is a 85 y.o. female with a history of diabetes mellitus type 2, asthma comes to urgent care with complaints of nausea, vomiting and some abdominal bloating as well as diarrhea of 3 days duration.  Patient symptoms started after she ate some bologna.  Vomiting is nonbloody or nonbilious.  Patient has nausea associated with vomiting.  No known relieving factors.  She has had several bouts of diarrhea which is not melanotic or bloody.  Patient has had 3 small bowel movements today.  She has some abdominal bloating but no pain.  She was able to keep some oral intake down today.  No shortness of breath, generalized weakness, fever or chills.  Cough or sputum production.  HPI  Past Medical History:  Diagnosis Date  . Acute superficial venous thrombosis of left lower extremity 03/27/2014   concern about hx and potential extensive on exam tenderness calf and low dose lovenox 40 qd 2-4 weekselevetion and close  fu advised .   [name] Allergy   . Anemia   . Anxiety   . Arthritis    "shoulders" (09/09/2012)  . Asthma 09/08/2012  . Carotid bruit    [place] 2018  low risk 1 - 39%  . Diabetes mellitus without complication ([place])    "Borderline" per pt; being monitored.  no meds per pt  . Dyspnea 04/27/2014  . GERD (gastroesophageal reflux disease)   . Headache(784.0)    "related to my high blood pressure" (09/09/2012)  . History of DVT (deep vein thrombosis)    "RLE" (09/09/2012) coumadine cant take asa so on plavix   . HTN (hypertension) 09/08/2012  . Hyperlipidemia   . Lupus ([employer])    "cured years ago" (09/09/2012)  . Other dysphagia 02/11/2013  . Syncope and collapse 01/21/2018  . Varicose veins   . Varicose veins of leg with complications 2/67/1245    Patient Active Problem List   Diagnosis  Date Noted  . Hypertensive heart disease with hypertensive chronic kidney disease 11/30/2017  . Fasting hyperglycemia 11/30/2017  . Renal cyst 03/27/2016  . CKD (chronic kidney disease) stage 3, GFR 30-59 ml/min (HCC) 08/23/2015  . Perceived hearing changes 06/29/2014  . Resistant hypertension 06/29/2014  . Lumbago 06/15/2014  . Pre-diabetes vs early DM  06/15/2014  . Dyspnea 04/27/2014  . Asthma, chronic 04/13/2014  . Elevated uric acid in blood 04/13/2014  . Essential hypertension 03/27/2014  . History of DVT (deep vein thrombosis)   . Palpitations 01/05/2014  . Anemia of chronic disease 18-Jan-2014  . Death of family member 2013-09-22  . Leg edema 06/20/2013  . Bereavement due to life event 04/21/2013  . Other dysphagia 02/11/2013  . Colon cancer screening 02/11/2013  . Anxiety state 01/20/2013  . Leg cramps 01/20/2013  . Back pain 12/05/2012  . Family hx of colon cancer 10/21/2012  . Family hx of lung cancer 10/21/2012  . Family hx-breast malignancy 10/21/2012  . Renal insufficiency creatinine 1.3 10/21/2012  . Anemia, chronic disease 10/21/2012  . GERD (gastroesophageal reflux disease) 09/08/2012  . HTN (hypertension) 09/08/2012  . Hyperlipidemia 09/08/2012  . Asthma 09/08/2012  . Varicose veins of leg with complications 80/99/8338    Past Surgical History:  Procedure Laterality Date  . ABDOMINAL HYSTERECTOMY  partial  . BREAST EXCISIONAL BIOPSY Right   . BREAST SURGERY    . CARPAL TUNNEL RELEASE Right 2000's  . CARPAL TUNNEL RELEASE Bilateral 10/21/2013   Procedure: BILATERAL  CARPAL TUNNEL RELEASE;  Surgeon: [name], MD;  Location: [facility];  Service: Orthopedics;  Laterality: Bilateral;  . SHOULDER OPEN ROTATOR CUFF REPAIR Bilateral 2000's  . TRIGGER FINGER RELEASE Right 10/21/2013   Procedure: RELEASE TRIGGER FINGER/A-1 PULLEY RIGHT MIDDLE AND RIGHT RING;  Surgeon: [name], MD;  Location: [facility];  Service:  Orthopedics;  Laterality: Right;    OB History    Gravida  7   Para  7   Term      Preterm      AB      Living        SAB      TAB      Ectopic      Multiple      Live Births           Obstetric Comments  Lost 2 children soon after birth         Home Medications    Prior to Admission medications   Medication Sig Start Date End Date Taking? Authorizing Provider  acetaminophen (TYLENOL) 500 MG tablet Take 1,000 mg by mouth every 6 (six) hours as needed for moderate pain. Reported on 04/30/2015    [provider]  albuterol (PROAIR HFA) 108 (90 Base) MCG/ACT inhaler USE 2 PUFFS EVERY 6 HOURS AS NEEDED FOR WHEEZING. 10/28/18   Panosh, Standley Brooking, MD  albuterol (PROVENTIL) (2.5 MG/3ML) 0.083% nebulizer solution USE 1 AMPULE EVERY 6 HOURS AS NEEDED FOR WHEEZE 08/21/18   Panosh, Standley Brooking, MD  amLODipine-olmesartan (AZOR) 10-40 MG tablet Take 1 tablet by mouth daily.    Madelon Lips, MD  atorvastatin (LIPITOR) 20 MG tablet TAKE 1 TABLET ONCE DAILY. 08/26/18   Panosh, Standley Brooking, MD  carvedilol (COREG) 12.5 MG tablet Take 6.25 mg by mouth daily. 1/2 tablet daily    Madelon Lips, MD  citalopram (CELEXA) 20 MG tablet TAKE 1 TABLET ONCE DAILY. 07/15/18   Panosh, Standley Brooking, MD  clopidogrel (PLAVIX) 75 MG tablet TAKE 1 TABLET ONCE DAILY. 10/09/18   Panosh, Standley Brooking, MD  diclofenac sodium (VOLTAREN) 1 % GEL APPLY 4GM TO AFFECTED AREA(S) 4 TIMES A DAY. 07/13/17   Panosh, Standley Brooking, MD  doxazosin (CARDURA) 2 MG tablet TAKE 3 TABLETS AT BEDTIME. 10/28/18   Panosh, Standley Brooking, MD  ferrous sulfate 325 (65 FE) MG EC tablet Take 1 tablet (325 mg total) by mouth daily with breakfast. 12/15/14   Panosh, Standley Brooking, MD  fluorometholone (FML) 0.1 % ophthalmic suspension Place 1 drop into both eyes 2 (two) times daily.  09/13/17   [provider]  Fluticasone-Salmeterol (ADVAIR) 250-50 MCG/DOSE AEPB USE 1 PUFF TWICE DAILY. 01/31/18   Panosh, Standley Brooking, MD  furosemide (LASIX) 80 MG tablet Take 80 mg  in the morning and 40 mg in the evening due to persistent lower extremity edema.    Madelon Lips, MD  hydrALAZINE (APRESOLINE) 50 MG tablet Take 50 mg by mouth 3 (three) times daily.    Madelon Lips, MD  lactobacillus acidophilus & bulgar (LACTINEX) chewable tablet Chew 1 tablet by mouth 3 (three) times daily with meals. 10/30/18   Maysel Mccolm, Myrene Galas, MD  lidocaine (LIDODERM) 5 % Place 1 patch onto the skin daily. Remove & Discard patch within 12 hours or as  directed by MD Patient taking differently: Place 1 patch onto the skin daily as needed (pain). Remove & Discard patch within 12 hours or as directed by MD 01/04/17   Lysbeth Penner, FNP  LORazepam (ATIVAN) 1 MG tablet TAKE 1 TABLET EVERY 8 HOURS AS NEEDED FOR ANXIETY. AVOID REGULAR USE. 09/24/18   Panosh, Standley Brooking, MD  montelukast (SINGULAIR) 10 MG tablet TAKE 1 TABLET ONCE DAILY. 04/23/18   Panosh, Standley Brooking, MD  ondansetron (ZOFRAN ODT) 4 MG disintegrating tablet Take 1 tablet (4 mg total) by mouth every 8 (eight) hours as needed for nausea or vomiting. 10/30/18   Meris Reede, Myrene Galas, MD  pantoprazole (PROTONIX) 40 MG tablet TAKE (1) TABLET TWICE A DAY BEFORE MEALS. 10/09/18   Panosh, Standley Brooking, MD  potassium chloride (K-DUR) 10 MEQ tablet TAKE 4 TABLETS DAILY. 10/09/18   Panosh, Standley Brooking, MD  potassium chloride (K-DUR,KLOR-CON) 10 MEQ tablet Take 10 mEq by mouth 4 (four) times daily.    [provider]  predniSONE (STERAPRED UNI-PAK 21 TAB) 10 MG (21) TBPK tablet Take by mouth daily. Take 6 tabs by mouth daily  for 2 days, then 5 tabs for 2 days, then 4 tabs for 2 days, and so on until done 08/02/18   Harrie Foreman, MD  RESTASIS 0.05 % ophthalmic emulsion Place 1 drop into both eyes 2 (two) times daily.  01/01/15   [provider]  Spacer/Aero-Holding Chambers (AEROCHAMBER PLUS) inhaler Use as instructed 05/26/16   Melynda Ripple, MD  vitamin B-12 (CYANOCOBALAMIN) 1000 MCG tablet Take 1,000 mcg by mouth daily.    [provider]    Family History Family History  Problem Relation Age of Onset  . Hypertension Mother   . Breast cancer Mother   . Hypertension Father   . Cancer Sister        Colon and Breast Cancer  . Breast cancer Sister   . Colon cancer Sister        ? 63' s dx - died in 53's  . Hypertension Brother   . Rectal cancer Brother   . Stomach cancer Brother   . Breast cancer Brother   . Heart attack Daughter   . Esophageal cancer Daughter   . Lung cancer Other        both parents   . Breast cancer Paternal Aunt     Social History Social History   Tobacco Use  . Smoking status: Never Smoker  . Smokeless tobacco: Never Used  Substance Use Topics  . Alcohol use: No  . Drug use: No     Allergies   Penicillins and Aspirin   Review of Systems Review of Systems  Constitutional: Negative for activity change, appetite change, fatigue and fever.  HENT: Negative.   Respiratory: Negative.   Gastrointestinal: Positive for abdominal distention, diarrhea, nausea and vomiting. Negative for abdominal pain and constipation.  Genitourinary: Negative for dysuria, frequency and urgency.  Musculoskeletal: Negative.   Neurological: Negative.   Psychiatric/Behavioral: Negative for agitation, confusion and decreased concentration.     Physical Exam Triage Vital Signs ED Triage Vitals  Enc Vitals Group     BP 10/30/18 1442 (!) 161/63     Pulse Rate 10/30/18 1442 65     Resp 10/30/18 1442 16     Temp 10/30/18 1442 98.3 F (36.8 C)     Temp Source 10/30/18 1442 Oral     SpO2 10/30/18 1442 97 %     Weight --  Height --      Head Circumference --      Peak Flow --      Pain Score 10/30/18 1443 2     Pain Loc --      Pain Edu? --      Excl. in [place]? --    No data found.  Updated Vital Signs BP (!) 161/63 (BP Location: Left Arm)   Pulse 65   Temp 98.3 F (36.8 C) (Oral)   Resp 16   SpO2 97%   Visual Acuity Right Eye Distance:   Left Eye Distance:   Bilateral  Distance:    Right Eye Near:   Left Eye Near:    Bilateral Near:     Physical Exam Constitutional:      General: She is not in acute distress.    Appearance: Normal appearance. She is ill-appearing. She is not toxic-appearing.  HENT:     Nose: Nose normal.     Mouth/Throat:     Mouth: Mucous membranes are moist.     Pharynx: Oropharynx is clear.  Eyes:     Conjunctiva/sclera: Conjunctivae normal.  Cardiovascular:     Rate and Rhythm: Normal rate and regular rhythm.     Pulses: Normal pulses.     Heart sounds: Normal heart sounds.  Pulmonary:     Effort: Pulmonary effort is normal. No respiratory distress.     Breath sounds: Normal breath sounds. No stridor. No wheezing, rhonchi or rales.  Chest:     Chest wall: No tenderness.  Abdominal:     General: Bowel sounds are normal. There is no distension.     Palpations: Abdomen is soft. There is no mass.     Tenderness: There is no abdominal tenderness. There is no guarding or rebound.     Hernia: No hernia is present.  Musculoskeletal: Normal range of motion.  Skin:    General: Skin is warm and dry.     Capillary Refill: Capillary refill takes less than 2 seconds.     Coloration: Skin is not jaundiced.     Findings: No bruising or lesion.  Neurological:     Mental Status: She is alert.      UC Treatments / Results  Labs (all labs ordered are listed, but only abnormal results are displayed) Labs Reviewed - No data to display  EKG None  Radiology No results found.  Procedures Procedures (including critical care time)  Medications Ordered in UC Medications - No data to display  Initial Impression / Assessment and Plan / UC Course  I have reviewed the triage vital signs and the nursing notes.  Pertinent labs & imaging results that were available during my care of the patient were reviewed by me and considered in my medical decision making (see chart for details).     1.  Acute gastroenteritis secondary to  food poisoning likely oxygen neck: Probiotics Encourage oral fluid intake Zofran as needed for nausea/vomiting Continue Protonix If no improvement, patient will need reevaluation with possible lab draws at that time.  2.  Diabetes mellitus type 2: Controlled  Final Clinical Impressions(s) / UC Diagnoses   Final diagnoses:  Gastroenteritis due to food toxin   Discharge Instructions   None    ED Prescriptions    Medication Sig Dispense Auth. Provider   lactobacillus acidophilus & bulgar (LACTINEX) chewable tablet Chew 1 tablet by mouth 3 (three) times daily with meals. 30 tablet Takuya Lariccia, Myrene Galas, MD   ondansetron (ZOFRAN ODT)  4 MG disintegrating tablet Take 1 tablet (4 mg total) by mouth every 8 (eight) hours as needed for nausea or vomiting. 20 tablet Danyla Wattley, Myrene Galas, MD     Controlled Substance Prescriptions [place] Controlled Substance Registry consulted? No   Chase Picket, MD 10/30/18 [phone]

## 2018-10-30 NOTE — ED Triage Notes (Signed)
Pt presents with nausea, vomiting, and diarrhea since Sunday from unknown source.

## 2018-11-02 ENCOUNTER — Encounter (HOSPITAL_COMMUNITY): Payer: Self-pay | Admitting: Emergency Medicine

## 2018-11-02 ENCOUNTER — Emergency Department (HOSPITAL_COMMUNITY): Payer: Medicare Other

## 2018-11-02 ENCOUNTER — Observation Stay (HOSPITAL_COMMUNITY)
Admission: EM | Admit: 2018-11-02 | Discharge: 2018-11-04 | Disposition: A | Discharge status: Home / Self Care | Payer: Medicare Other | Attending: Emergency Medicine | Admitting: Emergency Medicine

## 2018-11-02 DIAGNOSIS — I129 Hypertensive chronic kidney disease with stage 1 through stage 4 chronic kidney disease, or unspecified chronic kidney disease: Secondary | ICD-10-CM | POA: Diagnosis not present

## 2018-11-02 DIAGNOSIS — Z7902 Long term (current) use of antithrombotics/antiplatelets: Secondary | ICD-10-CM | POA: Insufficient documentation

## 2018-11-02 DIAGNOSIS — R112 Nausea with vomiting, unspecified: Principal | ICD-10-CM

## 2018-11-02 DIAGNOSIS — R778 Other specified abnormalities of plasma proteins: Secondary | ICD-10-CM

## 2018-11-02 DIAGNOSIS — Z79899 Other long term (current) drug therapy: Secondary | ICD-10-CM | POA: Diagnosis not present

## 2018-11-02 DIAGNOSIS — R1013 Epigastric pain: Secondary | ICD-10-CM | POA: Diagnosis not present

## 2018-11-02 DIAGNOSIS — R110 Nausea: Secondary | ICD-10-CM

## 2018-11-02 DIAGNOSIS — R7989 Other specified abnormal findings of blood chemistry: Secondary | ICD-10-CM

## 2018-11-02 DIAGNOSIS — J45909 Unspecified asthma, uncomplicated: Secondary | ICD-10-CM | POA: Diagnosis present

## 2018-11-02 DIAGNOSIS — E1122 Type 2 diabetes mellitus with diabetic chronic kidney disease: Secondary | ICD-10-CM | POA: Insufficient documentation

## 2018-11-02 DIAGNOSIS — N183 Chronic kidney disease, stage 3 unspecified: Secondary | ICD-10-CM | POA: Diagnosis present

## 2018-11-02 DIAGNOSIS — R1110 Vomiting, unspecified: Secondary | ICD-10-CM | POA: Diagnosis not present

## 2018-11-02 DIAGNOSIS — Z20828 Contact with and (suspected) exposure to other viral communicable diseases: Secondary | ICD-10-CM | POA: Insufficient documentation

## 2018-11-02 DIAGNOSIS — E871 Hypo-osmolality and hyponatremia: Secondary | ICD-10-CM

## 2018-11-02 LAB — COMPREHENSIVE METABOLIC PANEL
ALT: 7 U/L (ref 0–44)
AST: 18 U/L (ref 15–41)
Albumin: 3.3 g/dL — ABNORMAL LOW (ref 3.5–5.0)
Alkaline Phosphatase: 40 U/L (ref 38–126)
Anion gap: 12 (ref 5–15)
BUN: 22 mg/dL (ref 8–23)
CO2: 23 mmol/L (ref 22–32)
Calcium: 8.8 mg/dL — ABNORMAL LOW (ref 8.9–10.3)
Chloride: 90 mmol/L — ABNORMAL LOW (ref 98–111)
Creatinine, Ser: 1.73 mg/dL — ABNORMAL HIGH (ref 0.44–1.00)
GFR calc Af Amer: 31 mL/min — ABNORMAL LOW (ref >60)
GFR calc non Af Amer: 27 mL/min — ABNORMAL LOW (ref >60)
Glucose, Bld: 116 mg/dL — ABNORMAL HIGH (ref 70–99)
Potassium: 4.1 mmol/L (ref 3.5–5.1)
Sodium: 125 mmol/L — ABNORMAL LOW (ref 135–145)
Total Bilirubin: 0.5 mg/dL (ref 0.3–1.2)
Total Protein: 5.9 g/dL — ABNORMAL LOW (ref 6.5–8.1)

## 2018-11-02 LAB — CBC
HCT: 28 % — ABNORMAL LOW (ref 36.0–46.0)
Hemoglobin: 9.1 g/dL — ABNORMAL LOW (ref 12.0–15.0)
MCH: 28.6 pg (ref 26.0–34.0)
MCHC: 32.5 g/dL (ref 30.0–36.0)
MCV: 88.1 fL (ref 80.0–100.0)
Platelets: 201 10*3/uL (ref 150–400)
RBC: 3.18 MIL/uL — ABNORMAL LOW (ref 3.87–5.11)
RDW: 11.9 % (ref 11.5–15.5)
WBC: 4 10*3/uL (ref 4.0–10.5)
nRBC: 0 % (ref 0.0–0.2)

## 2018-11-02 LAB — URINALYSIS, ROUTINE W REFLEX MICROSCOPIC
Bacteria, UA: NONE SEEN
Bilirubin Urine: NEGATIVE
Glucose, UA: NEGATIVE mg/dL
Ketones, ur: NEGATIVE mg/dL
Nitrite: NEGATIVE
Protein, ur: 30 mg/dL — AB
Specific Gravity, Urine: 1.003 — ABNORMAL LOW (ref 1.005–1.030)
pH: 7 (ref 5.0–8.0)

## 2018-11-02 LAB — LIPASE, BLOOD: Lipase: 36 U/L (ref 11–51)

## 2018-11-02 LAB — TROPONIN I: Troponin I: 0.09 ng/mL (ref <0.03)

## 2018-11-02 MED ORDER — SODIUM CHLORIDE 0.9% FLUSH
3.0000 mL | Freq: Once | INTRAVENOUS | Status: AC
  Administered 2018-11-02: 3 mL via INTRAVENOUS

## 2018-11-02 MED ORDER — ONDANSETRON HCL 4 MG/2ML IJ SOLN
4.0000 mg | Freq: Once | INTRAMUSCULAR | Status: AC
  Administered 2018-11-02: 4 mg via INTRAVENOUS
  Filled 2018-11-02: qty 2

## 2018-11-02 MED ORDER — IOHEXOL 300 MG/ML  SOLN
75.0000 mL | Freq: Once | INTRAMUSCULAR | Status: AC | PRN
  Administered 2018-11-02: 75 mL via INTRAVENOUS

## 2018-11-02 NOTE — ED Provider Notes (Signed)
[facility] EMERGENCY DEPARTMENT Provider Note   CSN: [id number] Arrival date & time: 11/02/18  2033     History   Chief Complaint Chief Complaint  Patient presents with  . Emesis    HPI [name] is a 85 y.o. female.     Patient with history of diabetes, HTN, high cholesterol (followed by Dr. Acie Fredrickson), GERD, pulmonary hypertension, lupus, history of DVT, chronic kidney disease -- presents to the emergency department with intermittent nausea, vomiting, and diarrhea.  Patient was seen at urgent care on 6/17 with development of abdominal pain, vomiting, diarrhea after eating bologna.  She states that her symptoms then improved and she was feeling much better this morning.  Today she had bullion soup and symptoms started again.  She did not have any vomiting but states severe nausea and upper abdominal pain.  She denies any diarrhea today.  No fevers, chest pain, shortness of breath or cough.  No urinary symptoms.  She feels generally weak.  Onset of symptoms acute.  Course is constant.     Past Medical History:  Diagnosis Date  . Acute superficial venous thrombosis of left lower extremity 03/27/2014   concern about hx and potential extensive on exam tenderness calf and low dose lovenox 40 qd 2-4 weekselevetion and close  fu advised .   [name] Allergy   . Anemia   . Anxiety   . Arthritis    "shoulders" (09/09/2012)  . Asthma 09/08/2012  . Carotid bruit    [place] 2018  low risk 1 - 39%  . Diabetes mellitus without complication ([place])    "Borderline" per pt; being monitored.  no meds per pt  . Dyspnea 04/27/2014  . GERD (gastroesophageal reflux disease)   . Headache(784.0)    "related to my high blood pressure" (09/09/2012)  . History of DVT (deep vein thrombosis)    "RLE" (09/09/2012) coumadine cant take asa so on plavix   . HTN (hypertension) 09/08/2012  . Hyperlipidemia   . Lupus ([place])    "cured years ago" (09/09/2012)  . Other dysphagia 02/11/2013  . Syncope  and collapse 01/21/2018  . Varicose veins   . Varicose veins of leg with complications 3/53/6144    Patient Active Problem List   Diagnosis Date Noted  . Hypertensive heart disease with hypertensive chronic kidney disease 11/30/2017  . Fasting hyperglycemia 11/30/2017  . Renal cyst 03/27/2016  . CKD (chronic kidney disease) stage 3, GFR 30-59 ml/min (HCC) 08/23/2015  . Perceived hearing changes 06/29/2014  . Resistant hypertension 06/29/2014  . Lumbago 06/15/2014  . Pre-diabetes vs early DM  06/15/2014  . Dyspnea 04/27/2014  . Asthma, chronic 04/13/2014  . Elevated uric acid in blood 04/13/2014  . Essential hypertension 03/27/2014  . History of DVT (deep vein thrombosis)   . Palpitations 01/05/2014  . Anemia of chronic disease Dec 23, 2013  . Death of family member 2013/08/27  . Leg edema 06/20/2013  . Bereavement due to life event 04/21/2013  . Other dysphagia 02/11/2013  . Colon cancer screening 02/11/2013  . Anxiety state 01/20/2013  . Leg cramps 01/20/2013  . Back pain 12/05/2012  . Family hx of colon cancer 10/21/2012  . Family hx of lung cancer 10/21/2012  . Family hx-breast malignancy 10/21/2012  . Renal insufficiency creatinine 1.3 10/21/2012  . Anemia, chronic disease 10/21/2012  . GERD (gastroesophageal reflux disease) 09/08/2012  . HTN (hypertension) 09/08/2012  . Hyperlipidemia 09/08/2012  . Asthma 09/08/2012  . Varicose veins of leg with complications 31/54/0086  Past Surgical History:  Procedure Laterality Date  . ABDOMINAL HYSTERECTOMY     partial  . BREAST EXCISIONAL BIOPSY Right   . BREAST SURGERY    . CARPAL TUNNEL RELEASE Right 2000's  . CARPAL TUNNEL RELEASE Bilateral 10/21/2013   Procedure: BILATERAL  CARPAL TUNNEL RELEASE;  Surgeon: [name], MD;  Location: [place];  Service: Orthopedics;  Laterality: Bilateral;  . SHOULDER OPEN ROTATOR CUFF REPAIR Bilateral 2000's  . TRIGGER FINGER RELEASE Right 10/21/2013   Procedure:  RELEASE TRIGGER FINGER/A-1 PULLEY RIGHT MIDDLE AND RIGHT RING;  Surgeon: [name], MD;  Location: [place];  Service: Orthopedics;  Laterality: Right;     OB History    Gravida  7   Para  7   Term      Preterm      AB      Living        SAB      TAB      Ectopic      Multiple      Live Births           Obstetric Comments  Lost 2 children soon after birth         Home Medications    Prior to Admission medications   Medication Sig Start Date End Date Taking? Authorizing Provider  acetaminophen (TYLENOL) 500 MG tablet Take 1,000 mg by mouth every 6 (six) hours as needed for moderate pain. Reported on 04/30/2015    [provider]  albuterol (PROAIR HFA) 108 (90 Base) MCG/ACT inhaler USE 2 PUFFS EVERY 6 HOURS AS NEEDED FOR WHEEZING. 10/28/18   Panosh, Standley Brooking, MD  albuterol (PROVENTIL) (2.5 MG/3ML) 0.083% nebulizer solution USE 1 AMPULE EVERY 6 HOURS AS NEEDED FOR WHEEZE 08/21/18   Panosh, Standley Brooking, MD  amLODipine-olmesartan (AZOR) 10-40 MG tablet Take 1 tablet by mouth daily.    Madelon Lips, MD  atorvastatin (LIPITOR) 20 MG tablet TAKE 1 TABLET ONCE DAILY. 08/26/18   Panosh, Standley Brooking, MD  carvedilol (COREG) 12.5 MG tablet Take 6.25 mg by mouth daily. 1/2 tablet daily    Madelon Lips, MD  citalopram (CELEXA) 20 MG tablet TAKE 1 TABLET ONCE DAILY. 07/15/18   Panosh, Standley Brooking, MD  clopidogrel (PLAVIX) 75 MG tablet TAKE 1 TABLET ONCE DAILY. 10/09/18   Panosh, Standley Brooking, MD  diclofenac sodium (VOLTAREN) 1 % GEL APPLY 4GM TO AFFECTED AREA(S) 4 TIMES A DAY. 07/13/17   Panosh, Standley Brooking, MD  doxazosin (CARDURA) 2 MG tablet TAKE 3 TABLETS AT BEDTIME. 10/28/18   Panosh, Standley Brooking, MD  ferrous sulfate 325 (65 FE) MG EC tablet Take 1 tablet (325 mg total) by mouth daily with breakfast. 12/15/14   Panosh, Standley Brooking, MD  fluorometholone (FML) 0.1 % ophthalmic suspension Place 1 drop into both eyes 2 (two) times daily.  09/13/17   [provider]   Fluticasone-Salmeterol (ADVAIR) 250-50 MCG/DOSE AEPB USE 1 PUFF TWICE DAILY. 01/31/18   Panosh, Standley Brooking, MD  furosemide (LASIX) 80 MG tablet Take 80 mg in the morning and 40 mg in the evening due to persistent lower extremity edema.    Madelon Lips, MD  hydrALAZINE (APRESOLINE) 50 MG tablet Take 50 mg by mouth 3 (three) times daily.    Madelon Lips, MD  lactobacillus acidophilus & bulgar (LACTINEX) chewable tablet Chew 1 tablet by mouth 3 (three) times daily with meals. 10/30/18   LampteyMyrene Galas, MD  lidocaine (LIDODERM) 5 %  Place 1 patch onto the skin daily. Remove & Discard patch within 12 hours or as directed by MD Patient taking differently: Place 1 patch onto the skin daily as needed (pain). Remove & Discard patch within 12 hours or as directed by MD 01/04/17   Lysbeth Penner, FNP  LORazepam (ATIVAN) 1 MG tablet TAKE 1 TABLET EVERY 8 HOURS AS NEEDED FOR ANXIETY. AVOID REGULAR USE. 09/24/18   Panosh, Standley Brooking, MD  montelukast (SINGULAIR) 10 MG tablet TAKE 1 TABLET ONCE DAILY. 04/23/18   Panosh, Standley Brooking, MD  ondansetron (ZOFRAN ODT) 4 MG disintegrating tablet Take 1 tablet (4 mg total) by mouth every 8 (eight) hours as needed for nausea or vomiting. 10/30/18   Lamptey, Myrene Galas, MD  pantoprazole (PROTONIX) 40 MG tablet TAKE (1) TABLET TWICE A DAY BEFORE MEALS. 10/09/18   Panosh, Standley Brooking, MD  potassium chloride (K-DUR) 10 MEQ tablet TAKE 4 TABLETS DAILY. 10/09/18   Panosh, Standley Brooking, MD  potassium chloride (K-DUR,KLOR-CON) 10 MEQ tablet Take 10 mEq by mouth 4 (four) times daily.    [provider]  predniSONE (STERAPRED UNI-PAK 21 TAB) 10 MG (21) TBPK tablet Take by mouth daily. Take 6 tabs by mouth daily  for 2 days, then 5 tabs for 2 days, then 4 tabs for 2 days, and so on until done 08/02/18   Harrie Foreman, MD  RESTASIS 0.05 % ophthalmic emulsion Place 1 drop into both eyes 2 (two) times daily.  01/01/15   [provider]  Spacer/Aero-Holding Chambers (AEROCHAMBER PLUS)  inhaler Use as instructed 05/26/16   Melynda Ripple, MD  vitamin B-12 (CYANOCOBALAMIN) 1000 MCG tablet Take 1,000 mcg by mouth daily.    [provider]    Family History Family History  Problem Relation Age of Onset  . Hypertension Mother   . Breast cancer Mother   . Hypertension Father   . Cancer Sister        Colon and Breast Cancer  . Breast cancer Sister   . Colon cancer Sister        ? 44' s dx - died in 60's  . Hypertension Brother   . Rectal cancer Brother   . Stomach cancer Brother   . Breast cancer Brother   . Heart attack Daughter   . Esophageal cancer Daughter   . Lung cancer Other        both parents   . Breast cancer Paternal Aunt     Social History Social History   Tobacco Use  . Smoking status: Never Smoker  . Smokeless tobacco: Never Used  Substance Use Topics  . Alcohol use: No  . Drug use: No     Allergies   Penicillins and Aspirin   Review of Systems Review of Systems  Constitutional: Negative for fever.  HENT: Negative for rhinorrhea and sore throat.   Eyes: Negative for redness.  Respiratory: Negative for cough.   Cardiovascular: Negative for chest pain.  Gastrointestinal: Positive for abdominal pain, diarrhea, nausea and vomiting.  Genitourinary: Negative for dysuria.  Musculoskeletal: Negative for myalgias.  Skin: Negative for rash.  Neurological: Negative for headaches.     Physical Exam Updated Vital Signs BP (!) 172/52 (BP Location: Right Arm)   Pulse 74   Temp 98.7 F (37.1 C) (Oral)   Resp 16   Ht 5\' 1"  (1.549 m)   Wt 82 kg   SpO2 98%   BMI 34.16 kg/m   Physical Exam Vitals signs and nursing  note reviewed.  Constitutional:      Appearance: She is well-developed.  HENT:     Head: Normocephalic and atraumatic.  Eyes:     General:        Right eye: No discharge.        Left eye: No discharge.     Conjunctiva/sclera: Conjunctivae normal.  Neck:     Musculoskeletal: Normal range of motion and neck  supple.  Cardiovascular:     Rate and Rhythm: Normal rate and regular rhythm.     Heart sounds: Normal heart sounds.  Pulmonary:     Effort: Pulmonary effort is normal.     Breath sounds: Normal breath sounds.  Abdominal:     Palpations: Abdomen is soft.     Tenderness: There is abdominal tenderness in the right upper quadrant, epigastric area and left upper quadrant. There is no guarding or rebound.     Comments: Mild to moderate epigastric pain.  Skin:    General: Skin is warm and dry.  Neurological:     Mental Status: She is alert.      ED Treatments / Results  Labs (all labs ordered are listed, but only abnormal results are displayed) Labs Reviewed  COMPREHENSIVE METABOLIC PANEL - Abnormal; Notable for the following components:      Result Value   Sodium 125 (*)    Chloride 90 (*)    Glucose, Bld 116 (*)    Creatinine, Ser 1.73 (*)    Calcium 8.8 (*)    Total Protein 5.9 (*)    Albumin 3.3 (*)    GFR calc non Af Amer 27 (*)    GFR calc Af Amer 31 (*)    All other components within normal limits  CBC - Abnormal; Notable for the following components:   RBC 3.18 (*)    Hemoglobin 9.1 (*)    HCT 28.0 (*)    All other components within normal limits  URINALYSIS, ROUTINE W REFLEX MICROSCOPIC - Abnormal; Notable for the following components:   Color, Urine COLORLESS (*)    Specific Gravity, Urine 1.003 (*)    Hgb urine dipstick SMALL (*)    Protein, ur 30 (*)    Leukocytes,Ua TRACE (*)    All other components within normal limits  TROPONIN I - Abnormal; Notable for the following components:   Troponin I 0.09 (*)    All other components within normal limits  URINE CULTURE  SARS CORONAVIRUS 2 (HOSPITAL ORDER, [place] [facility])  LIPASE, BLOOD    EKG EKG Interpretation  Date/Time:  Saturday November 02 2018 21:23:28 EDT Ventricular Rate:  74 PR Interval:    QRS Duration: 94 QT Interval:  401 QTC Calculation: 445 R Axis:   94 Text  Interpretation:  Sinus rhythm Right axis deviation Confirmed by Ripley Fraise [phone]) on 11/02/2018 11:31:24 PM   Radiology Ct Abdomen Pelvis W Contrast  Result Date: 11/02/2018 CLINICAL DATA:  Epigastric pain and vomiting. EXAM: CT ABDOMEN AND PELVIS WITH CONTRAST TECHNIQUE: Multidetector CT imaging of the abdomen and pelvis was performed using the standard protocol following bolus administration of intravenous contrast. CONTRAST:  76mL OMNIPAQUE IOHEXOL 300 MG/ML  SOLN COMPARISON:  CT 05/03/2017 FINDINGS: Lower chest: Mild nonspecific bilateral subpleural reticulation. Multi chamber cardiomegaly. Hepatobiliary: No focal liver abnormality is seen. No gallstones, gallbladder wall thickening, or biliary dilatation. Pancreas: No ductal dilatation or inflammation. Spleen: Normal in size without focal abnormality. Adrenals/Urinary Tract: No adrenal nodule. No hydronephrosis. Mild left perinephric  edema. Simple cyst in the upper left kidney measures 3.7 cm. Additional small low-density lesions in both kidneys are too small to accurately characterize. Urinary bladder is physiologically distended. No definite bladder wall thickening. Stomach/Bowel: Small hiatal hernia. Stomach is nondistended. No bowel obstruction or wall thickening. No bowel inflammation. Appendix not confidently visualized, no evidence of appendicitis. Colonic diverticulosis without diverticulitis. Moderate diffuse colonic stool burden. No colonic wall thickening. Vascular/Lymphatic: Aorto bi-iliac atherosclerosis. No aneurysm. Small left central mesenteric lymph nodes with hazy mesentery chronic and unchanged from prior. No suspicious adenopathy in the abdomen or pelvis. Reproductive: Post hysterectomy. Ovaries are quiescent. Other: No free air, free fluid, or intra-abdominal fluid collection. Musculoskeletal: Multilevel degenerative change throughout the lumbar spine. Pubic symphyseal degenerative change. There are no acute or suspicious  osseous abnormalities. IMPRESSION: 1. Mild left perinephric edema, can be seen with urinary tract infection. 2. Colonic diverticulosis without diverticulitis. Small hiatal hernia. 3.  Aortic Atherosclerosis (ICD10-I70.0). Electronically Signed   By: Keith Rake M.D.   On: 11/02/2018 23:32    Procedures Procedures (including critical care time)  Medications Ordered in ED Medications  sodium chloride flush (NS) 0.9 % injection 3 mL (3 mLs Intravenous Given 11/02/18 2114)  ondansetron (ZOFRAN) injection 4 mg (4 mg Intravenous Given 11/02/18 2217)  iohexol (OMNIPAQUE) 300 MG/ML solution 75 mL (75 mLs Intravenous Contrast Given 11/02/18 2301)  sodium chloride 0.9 % bolus 500 mL (500 mLs Intravenous New Bag/Given 11/03/18 0049)  ondansetron (ZOFRAN) injection 4 mg (4 mg Intravenous Given 11/03/18 0049)     Initial Impression / Assessment and Plan / ED Course  I have reviewed the triage vital signs and the nursing notes.  Pertinent labs & imaging results that were available during my care of the patient were reviewed by me and considered in my medical decision making (see chart for details).        Patient seen and examined. Work-up initiated. Will obtain labs and imaging of the abdomen.   Vital signs reviewed and are as follows: BP (!) 151/70   Pulse 75   Temp 98.7 F (37.1 C) (Oral)   Resp 16   Ht 5\' 1"  (1.549 m)   Wt 82 kg   SpO2 97%   BMI 34.16 kg/m   12:35 AM CT images personally reviewed and interpreted.    Pt continues to be very nauseous. Her troponin is elevated to 0.09. This will need to be trended. She does not look well enough to go home even if her second marker was flat.   Patient discussed with and seen by Dr. Christy Gentles.  Will ask hospitalist for admit, to trend troponins, treat symptoms, r/o atypical ACS.   1:04 AM Updated granddaughter by telephone. They will call in AM for update.   Signout to [employer] awaiting callback for medicine admission.    Final  Clinical Impressions(s) / ED Diagnoses   Final diagnoses:  Epigastric pain  Elevated troponin  Non-intractable vomiting with nausea, unspecified vomiting type   Admit.   ED Discharge Orders    None       Carlisle Cater, Hershal Coria 11/03/18 2022    Ripley Fraise, MD 11/04/18 [phone]

## 2018-11-02 NOTE — ED Triage Notes (Signed)
Pt reports n/v/d x 6 days. Pt was seen at UC earlier in the week but has begun vomiting again in last 24 hours.  Increased weakness.

## 2018-11-03 ENCOUNTER — Other Ambulatory Visit: Payer: Self-pay

## 2018-11-03 DIAGNOSIS — R112 Nausea with vomiting, unspecified: Secondary | ICD-10-CM | POA: Diagnosis not present

## 2018-11-03 DIAGNOSIS — R110 Nausea: Secondary | ICD-10-CM

## 2018-11-03 DIAGNOSIS — N183 Chronic kidney disease, stage 3 (moderate): Secondary | ICD-10-CM | POA: Diagnosis not present

## 2018-11-03 DIAGNOSIS — R7989 Other specified abnormal findings of blood chemistry: Secondary | ICD-10-CM | POA: Diagnosis not present

## 2018-11-03 DIAGNOSIS — E871 Hypo-osmolality and hyponatremia: Secondary | ICD-10-CM

## 2018-11-03 DIAGNOSIS — R778 Other specified abnormalities of plasma proteins: Secondary | ICD-10-CM

## 2018-11-03 DIAGNOSIS — J45909 Unspecified asthma, uncomplicated: Secondary | ICD-10-CM | POA: Diagnosis not present

## 2018-11-03 LAB — GLUCOSE, CAPILLARY
Glucose-Capillary: 110 mg/dL — ABNORMAL HIGH (ref 70–99)
Glucose-Capillary: 107 mg/dL — ABNORMAL HIGH (ref 70–99)
Glucose-Capillary: 105 mg/dL — ABNORMAL HIGH (ref 70–99)
Glucose-Capillary: 103 mg/dL — ABNORMAL HIGH (ref 70–99)

## 2018-11-03 LAB — BASIC METABOLIC PANEL
Anion gap: 10 (ref 5–15)
BUN: 17 mg/dL (ref 8–23)
CO2: 24 mmol/L (ref 22–32)
Calcium: 8.8 mg/dL — ABNORMAL LOW (ref 8.9–10.3)
Chloride: 97 mmol/L — ABNORMAL LOW (ref 98–111)
Creatinine, Ser: 1.56 mg/dL — ABNORMAL HIGH (ref 0.44–1.00)
GFR calc Af Amer: 35 mL/min — ABNORMAL LOW (ref >60)
GFR calc non Af Amer: 31 mL/min — ABNORMAL LOW (ref >60)
Glucose, Bld: 124 mg/dL — ABNORMAL HIGH (ref 70–99)
Potassium: 3.8 mmol/L (ref 3.5–5.1)
Sodium: 131 mmol/L — ABNORMAL LOW (ref 135–145)

## 2018-11-03 LAB — OSMOLALITY: Osmolality: 279 mOsm/kg (ref 275–295)

## 2018-11-03 LAB — SARS CORONAVIRUS 2 BY RT PCR (HOSPITAL ORDER, PERFORMED IN ~~LOC~~ HOSPITAL LAB): SARS Coronavirus 2: NEGATIVE

## 2018-11-03 LAB — TROPONIN I
Troponin I: 0.11 ng/mL (ref <0.03)
Troponin I: 0.13 ng/mL (ref <0.03)
Troponin I: 0.13 ng/mL (ref <0.03)

## 2018-11-03 MED ORDER — INSULIN ASPART 100 UNIT/ML ~~LOC~~ SOLN: 0.0000 [IU] | Freq: Three times a day (TID) | SUBCUTANEOUS | Status: DC

## 2018-11-03 MED ORDER — ACETAMINOPHEN 650 MG RE SUPP: 650.0000 mg | Freq: Four times a day (QID) | RECTAL | Status: DC | PRN

## 2018-11-03 MED ORDER — HYDRALAZINE HCL 20 MG/ML IJ SOLN
5.0000 mg | INTRAMUSCULAR | Status: DC | PRN
  Administered 2018-11-03: 5 mg via INTRAVENOUS
  Filled 2018-11-03: qty 1

## 2018-11-03 MED ORDER — ALBUTEROL SULFATE (2.5 MG/3ML) 0.083% IN NEBU: 2.5000 mg | INHALATION_SOLUTION | Freq: Four times a day (QID) | RESPIRATORY_TRACT | Status: DC | PRN

## 2018-11-03 MED ORDER — ONDANSETRON HCL 4 MG/2ML IJ SOLN
4.0000 mg | Freq: Once | INTRAMUSCULAR | Status: AC
  Administered 2018-11-03: 4 mg via INTRAVENOUS
  Filled 2018-11-03: qty 2

## 2018-11-03 MED ORDER — ACETAMINOPHEN 325 MG PO TABS
650.0000 mg | ORAL_TABLET | Freq: Four times a day (QID) | ORAL | Status: DC | PRN
  Administered 2018-11-03 (×2): 650 mg via ORAL
  Filled 2018-11-03 (×2): qty 2

## 2018-11-03 MED ORDER — ALUM & MAG HYDROXIDE-SIMETH 200-200-20 MG/5ML PO SUSP: 30.0000 mL | Freq: Four times a day (QID) | ORAL | Status: DC | PRN

## 2018-11-03 MED ORDER — SODIUM CHLORIDE 0.9 % IV SOLN
INTRAVENOUS | Status: AC
  Administered 2018-11-03: 21:00:00 via INTRAVENOUS

## 2018-11-03 MED ORDER — ONDANSETRON HCL 4 MG/2ML IJ SOLN: 4.0000 mg | Freq: Four times a day (QID) | INTRAMUSCULAR | Status: DC | PRN

## 2018-11-03 MED ORDER — HEPARIN SODIUM (PORCINE) 5000 UNIT/ML IJ SOLN
5000.0000 [IU] | Freq: Three times a day (TID) | INTRAMUSCULAR | Status: DC
  Administered 2018-11-03 – 2018-11-04 (×3): 5000 [IU] via SUBCUTANEOUS
  Filled 2018-11-03 (×3): qty 1

## 2018-11-03 MED ORDER — PANTOPRAZOLE SODIUM 40 MG PO TBEC
40.0000 mg | DELAYED_RELEASE_TABLET | Freq: Every day | ORAL | Status: DC
  Administered 2018-11-03 – 2018-11-04 (×2): 40 mg via ORAL
  Filled 2018-11-03 (×2): qty 1

## 2018-11-03 MED ORDER — SODIUM CHLORIDE 0.9 % IV BOLUS
500.0000 mL | Freq: Once | INTRAVENOUS | Status: AC
  Administered 2018-11-03: 500 mL via INTRAVENOUS

## 2018-11-03 MED ORDER — SODIUM CHLORIDE 0.9 % IV SOLN
INTRAVENOUS | Status: DC
  Administered 2018-11-03: 04:00:00 via INTRAVENOUS

## 2018-11-03 NOTE — ED Provider Notes (Signed)
Patient seen/examined in the Emergency Department in conjunction with Advanced Practice Provider [place] Patient reports nausea/vomiting and abdominal pain Exam : awake/alert, appears uncomfortable, mild epigastric tenderness Plan: plan for admission due to persistent symptoms, pt is also dehydrated would benefit from rehydration    Ripley Fraise, MD 11/03/18 0031

## 2018-11-03 NOTE — ED Notes (Signed)
ED TO INPATIENT HANDOFF REPORT  ED Nurse Name and Phone #: [phone]  S Name/Age/Gender [name] 85 y.o. female Room/Bed: 025C/025C  Code Status   Code Status: Prior  Home/SNF/Other Home Patient oriented to: self, place, time and situation Is this baseline? Yes   Triage Complete: Triage complete  Chief Complaint Nausea, Weakness  Triage Note Pt reports n/v/d x 6 days. Pt was seen at UC earlier in the week but has begun vomiting again in last 24 hours.  Increased weakness.    Allergies Allergies  Allergen Reactions  . Penicillins Nausea And Vomiting and Other (See Comments)    Has patient had a PCN reaction causing immediate rash, facial/tongue/throat swelling, SOB or lightheadedness with hypotension: Y Has patient had a PCN reaction causing severe rash involving mucus membranes or skin necrosis: Y Has patient had a PCN reaction that required hospitalization: N Has patient had a PCN reaction occurring within the last 10 years: N If all of the above answers are "NO", then may proceed with Cephalosporin use.   . Aspirin Other (See Comments)    Wheezing Acetaminophen is OK    Level of Care/Admitting Diagnosis ED Disposition    ED Disposition Condition Comment   Admit  Hospital Area: [facility] [[id number]]  Level of Care: Telemetry Cardiac [103]  I expect the patient will be discharged within 24 hours: Yes  LOW acuity---Tx typically complete <24 hrs---ACUTE conditions typically can be evaluated <24 hours---LABS likely to return to acceptable levels <24 hours---IS near functional baseline---EXPECTED to return to current living arrangement---NOT newly hypoxic: Meets criteria for 5C-Observation unit  Covid Evaluation: Person Under Investigation (PUI)  Isolation Risk Level: Low Risk/Droplet (Less than 4L [place] supplementation)  Diagnosis: Vomiting [193790]  Admitting Physician: Shela Leff [[id number]]  Attending Physician: Shela Leff  [[id number]]  PT Class (Do Not Modify): Observation [104]  PT Acc Code (Do Not Modify): Observation [10022]       B Medical/Surgery History Past Medical History:  Diagnosis Date  . Acute superficial venous thrombosis of left lower extremity 03/27/2014   concern about hx and potential extensive on exam tenderness calf and low dose lovenox 40 qd 2-4 weekselevetion and close  fu advised .   [name] Allergy   . Anemia   . Anxiety   . Arthritis    "shoulders" (09/09/2012)  . Asthma 09/08/2012  . Carotid bruit    [place] 2018  low risk 1 - 39%  . Diabetes mellitus without complication ([place])    "Borderline" per pt; being monitored.  no meds per pt  . Dyspnea 04/27/2014  . GERD (gastroesophageal reflux disease)   . Headache(784.0)    "related to my high blood pressure" (09/09/2012)  . History of DVT (deep vein thrombosis)    "RLE" (09/09/2012) coumadine cant take asa so on plavix   . HTN (hypertension) 09/08/2012  . Hyperlipidemia   . Lupus ([place])    "cured years ago" (09/09/2012)  . Other dysphagia 02/11/2013  . Syncope and collapse 01/21/2018  . Varicose veins   . Varicose veins of leg with complications 6/83/4196   Past Surgical History:  Procedure Laterality Date  . ABDOMINAL HYSTERECTOMY     partial  . BREAST EXCISIONAL BIOPSY Right   . BREAST SURGERY    . CARPAL TUNNEL RELEASE Right 2000's  . CARPAL TUNNEL RELEASE Bilateral 10/21/2013   Procedure: BILATERAL  CARPAL TUNNEL RELEASE;  Surgeon: [name], MD;  Location: [facility];  Service: Orthopedics;  Laterality: Bilateral;  . SHOULDER OPEN ROTATOR CUFF REPAIR Bilateral 2000's  . TRIGGER FINGER RELEASE Right 10/21/2013   Procedure: RELEASE TRIGGER FINGER/A-1 PULLEY RIGHT MIDDLE AND RIGHT RING;  Surgeon: [name], MD;  Location: [facility];  Service: Orthopedics;  Laterality: Right;     A IV Location/Drains/Wounds Patient Lines/Drains/Airways Status   Active Line/Drains/Airways    Name:   Placement date:    Placement time:   Site:   Days:   Peripheral IV 11/02/18 Right Forearm   11/02/18    2113    Forearm   1          Intake/Output Last 24 hours No intake or output data in the 24 hours ending 11/03/18 0129  Labs/Imaging Results for orders placed or performed during the hospital encounter of 11/02/18 (from the past 48 hour(s))  Urinalysis, Routine w reflex microscopic     Status: Abnormal   Collection Time: 11/02/18  8:41 PM  Result Value Ref Range   Color, Urine COLORLESS (A) YELLOW   APPearance CLEAR CLEAR   Specific Gravity, Urine 1.003 (L) 1.005 - 1.030   pH 7.0 5.0 - 8.0   Glucose, UA NEGATIVE NEGATIVE mg/dL   Hgb urine dipstick SMALL (A) NEGATIVE   Bilirubin Urine NEGATIVE NEGATIVE   Ketones, ur NEGATIVE NEGATIVE mg/dL   Protein, ur 30 (A) NEGATIVE mg/dL   Nitrite NEGATIVE NEGATIVE   Leukocytes,Ua TRACE (A) NEGATIVE   RBC / HPF 0-5 0 - 5 RBC/hpf   WBC, UA 0-5 0 - 5 WBC/hpf   Bacteria, UA NONE SEEN NONE SEEN    Comment: Performed at [facility], [address], [place], [place] [address]  Lipase, blood     Status: None   Collection Time: 11/02/18  8:48 PM  Result Value Ref Range   Lipase 36 11 - 51 U/L    Comment: Performed at [facility] [address], [place], [place] [address]  Comprehensive metabolic panel     Status: Abnormal   Collection Time: 11/02/18  8:48 PM  Result Value Ref Range   Sodium 125 (L) 135 - 145 mmol/L   Potassium 4.1 3.5 - 5.1 mmol/L   Chloride 90 (L) 98 - 111 mmol/L   CO2 23 22 - 32 mmol/L   Glucose, Bld 116 (H) 70 - 99 mg/dL   BUN 22 8 - 23 mg/dL   Creatinine, Ser 1.73 (H) 0.44 - 1.00 mg/dL   Calcium 8.8 (L) 8.9 - 10.3 mg/dL   Total Protein 5.9 (L) 6.5 - 8.1 g/dL   Albumin 3.3 (L) 3.5 - 5.0 g/dL   AST 18 15 - 41 U/L   ALT 7 0 - 44 U/L   Alkaline Phosphatase 40 38 - 126 U/L   Total Bilirubin 0.5 0.3 - 1.2 mg/dL   GFR calc non Af Amer 27 (L) >60 mL/min   GFR calc Af Amer 31 (L) >60 mL/min   Anion gap 12 5 - 15     Comment: Performed at [facility], [place] [address] [place], [place] [address]  CBC     Status: Abnormal   Collection Time: 11/02/18  8:48 PM  Result Value Ref Range   WBC 4.0 4.0 - 10.5 K/uL   RBC 3.18 (L) 3.87 - 5.11 MIL/uL   Hemoglobin 9.1 (L) 12.0 - 15.0 g/dL   HCT 28.0 (L) 36.0 - 46.0 %   MCV 88.1 80.0 - 100.0 fL   MCH 28.6  26.0 - 34.0 pg   MCHC 32.5 30.0 - 36.0 g/dL   RDW 11.9 11.5 - 15.5 %   Platelets 201 150 - 400 K/uL   nRBC 0.0 0.0 - 0.2 %    Comment: Performed at [facility], [place] [address], [place], [place] [address]  Troponin I - ONCE - STAT     Status: Abnormal   Collection Time: 11/02/18  8:48 PM  Result Value Ref Range   Troponin I 0.09 (HH) <0.03 ng/mL    Comment: CRITICAL RESULT CALLED TO, READ BACK BY AND VERIFIED WITH: Pearlie Lafosse B,RN 11/02/18 2158 WAYK Performed at [facility], [place] [address], [place],  [address]    Ct Abdomen Pelvis W Contrast  Result Date: 11/02/2018 CLINICAL DATA:  Epigastric pain and vomiting. EXAM: CT ABDOMEN AND PELVIS WITH CONTRAST TECHNIQUE: Multidetector CT imaging of the abdomen and pelvis was performed using the standard protocol following bolus administration of intravenous contrast. CONTRAST:  53mL OMNIPAQUE IOHEXOL 300 MG/ML  SOLN COMPARISON:  CT 05/03/2017 FINDINGS: Lower chest: Mild nonspecific bilateral subpleural reticulation. Multi chamber cardiomegaly. Hepatobiliary: No focal liver abnormality is seen. No gallstones, gallbladder wall thickening, or biliary dilatation. Pancreas: No ductal dilatation or inflammation. Spleen: Normal in size without focal abnormality. Adrenals/Urinary Tract: No adrenal nodule. No hydronephrosis. Mild left perinephric edema. Simple cyst in the upper left kidney measures 3.7 cm. Additional small low-density lesions in both kidneys are too small to accurately characterize. Urinary bladder is physiologically distended. No definite bladder wall thickening. Stomach/Bowel: Small hiatal  hernia. Stomach is nondistended. No bowel obstruction or wall thickening. No bowel inflammation. Appendix not confidently visualized, no evidence of appendicitis. Colonic diverticulosis without diverticulitis. Moderate diffuse colonic stool burden. No colonic wall thickening. Vascular/Lymphatic: Aorto bi-iliac atherosclerosis. No aneurysm. Small left central mesenteric lymph nodes with hazy mesentery chronic and unchanged from prior. No suspicious adenopathy in the abdomen or pelvis. Reproductive: Post hysterectomy. Ovaries are quiescent. Other: No free air, free fluid, or intra-abdominal fluid collection. Musculoskeletal: Multilevel degenerative change throughout the lumbar spine. Pubic symphyseal degenerative change. There are no acute or suspicious osseous abnormalities. IMPRESSION: 1. Mild left perinephric edema, can be seen with urinary tract infection. 2. Colonic diverticulosis without diverticulitis. Small hiatal hernia. 3.  Aortic Atherosclerosis (ICD10-I70.0). Electronically Signed   By: Keith Rake M.D.   On: 11/02/2018 23:32    Pending Labs Unresulted Labs (From admission, onward)    Start     Ordered   11/03/18 0029  SARS Coronavirus 2 (CEPHEID - Performed in [facility] hospital lab), Hosp Order  (Asymptomatic Patients Labs)  Once,   STAT    Question:  Rule Out  Answer:  Yes   11/03/18 0028   11/03/18 0028  Urine Culture  Add-on,   AD     11/03/18 0027          Vitals/Pain Today's Vitals   11/02/18 2145 11/02/18 2200 11/02/18 2215 11/03/18 0049  BP: (!) 173/59 (!) 189/67 (!) 182/51 (!) 183/154  Pulse: 68 73 69 83  Resp: 17 18 19 17   Temp:      TempSrc:      SpO2: 98% 98% 96% 92%  Weight:      Height:      PainSc:        Isolation Precautions No active isolations  Medications Medications  sodium chloride flush (NS) 0.9 % injection 3 mL (3 mLs Intravenous Given 11/02/18 2114)  ondansetron (ZOFRAN) injection 4 mg (4 mg Intravenous Given  11/02/18 2217)  iohexol  (OMNIPAQUE) 300 MG/ML solution 75 mL (75 mLs Intravenous Contrast Given 11/02/18 2301)  sodium chloride 0.9 % bolus 500 mL (500 mLs Intravenous New Bag/Given 11/03/18 0049)  ondansetron (ZOFRAN) injection 4 mg (4 mg Intravenous Given 11/03/18 0049)    Mobility walks     Focused Assessments UTI, Dehydration    R Recommendations: See Admitting Provider Note  Report given to:   Additional Notes: N/A

## 2018-11-03 NOTE — H&P (Signed)
History and Physical    [name] GMW:[id number] DOB: [birth year] DOA: 11/02/2018  PCP: Burnis Medin, MD Patient coming from: Home  Chief Complaint: Nausea  HPI: [name] is a 85 y.o. female with medical history significant of anemia, anxiety, arthritis, asthma, type 2 diabetes, GERD, DVT, hypertension, hyperlipidemia, lupus, CKD stage III-IV, and conditions listed below presenting to the hospital for evaluation of nausea.  Patient states a week ago she had a bologna sandwich and started having vomiting and diarrhea soon after.  She then went to an urgent care center 5 days ago and was given a medicine for nausea.  States her diarrhea resolved a few days ago and nausea improved.  Today after eating soup she started feeling nauseous again but did not vomit.  She was later able to eat applesauce and keep it down.  Reports having some epigastric discomfort but no abdominal pain otherwise.  States she has acid reflux for which she takes a medicine.  Denies any fevers or chills.  Denies any chest pain at rest or with exertion.  Denies any dysuria.  ED Course: Afebrile, not tachycardic.  Blood pressure elevated with systolic in the 440H to 474Q.  No leukocytosis.  Hemoglobin 9.1, was 9.5 seven months ago.  Sodium 125, chloride 90.  Blood glucose 116.  BUN 22.  Creatinine 1.7, at baseline.  Lipase and LFTs normal.  UA not suggestive of infection.  Urine culture pending.  COVID-19 rapid test pending.  Troponin 0.09.  EKG not suggestive of ACS.  CT showing colonic diverticulosis without diverticulitis.  Patient received a 500 cc fluid bolus and Zofran in the ED.  Review of Systems:  All systems reviewed and apart from history of presenting illness, are negative.  Past Medical History:  Diagnosis Date  . Acute superficial venous thrombosis of left lower extremity 03/27/2014   concern about hx and potential extensive on exam tenderness calf and low dose lovenox 40 qd 2-4 weekselevetion and  close  fu advised .   [name] Allergy   . Anemia   . Anxiety   . Arthritis    "shoulders" (09/09/2012)  . Asthma 09/08/2012  . Carotid bruit    [place] 2018  low risk 1 - 39%  . Diabetes mellitus without complication ([place])    "Borderline" per pt; being monitored.  no meds per pt  . Dyspnea 04/27/2014  . GERD (gastroesophageal reflux disease)   . Headache(784.0)    "related to my high blood pressure" (09/09/2012)  . History of DVT (deep vein thrombosis)    "RLE" (09/09/2012) coumadine cant take asa so on plavix   . HTN (hypertension) 09/08/2012  . Hyperlipidemia   . Lupus ([place])    "cured years ago" (09/09/2012)  . Other dysphagia 02/11/2013  . Syncope and collapse 01/21/2018  . Varicose veins   . Varicose veins of leg with complications 5/95/6387    Past Surgical History:  Procedure Laterality Date  . ABDOMINAL HYSTERECTOMY     partial  . BREAST EXCISIONAL BIOPSY Right   . BREAST SURGERY    . CARPAL TUNNEL RELEASE Right 2000's  . CARPAL TUNNEL RELEASE Bilateral 10/21/2013   Procedure: BILATERAL  CARPAL TUNNEL RELEASE;  Surgeon: [name], MD;  Location: [place];  Service: Orthopedics;  Laterality: Bilateral;  . SHOULDER OPEN ROTATOR CUFF REPAIR Bilateral 2000's  . TRIGGER FINGER RELEASE Right 10/21/2013   Procedure: RELEASE TRIGGER FINGER/A-1 PULLEY RIGHT MIDDLE AND RIGHT RING;  Surgeon: [name],  MD;  Location: [place];  Service: Orthopedics;  Laterality: Right;     reports that she has never smoked. She has never used smokeless tobacco. She reports that she does not drink alcohol or use drugs.  Allergies  Allergen Reactions  . Penicillins Nausea And Vomiting and Other (See Comments)    Has patient had a PCN reaction causing immediate rash, facial/tongue/throat swelling, SOB or lightheadedness with hypotension: Y Has patient had a PCN reaction causing severe rash involving mucus membranes or skin necrosis: Y Has patient had a PCN reaction that  required hospitalization: N Has patient had a PCN reaction occurring within the last 10 years: N If all of the above answers are "NO", then may proceed with Cephalosporin use.   . Aspirin Other (See Comments)    Wheezing Acetaminophen is OK    Family History  Problem Relation Age of Onset  . Hypertension Mother   . Breast cancer Mother   . Hypertension Father   . Cancer Sister        Colon and Breast Cancer  . Breast cancer Sister   . Colon cancer Sister        ? 25' s dx - died in 45's  . Hypertension Brother   . Rectal cancer Brother   . Stomach cancer Brother   . Breast cancer Brother   . Heart attack Daughter   . Esophageal cancer Daughter   . Lung cancer Other        both parents   . Breast cancer Paternal Aunt     Prior to Admission medications   Medication Sig Start Date End Date Taking? Authorizing Provider  acetaminophen (TYLENOL) 500 MG tablet Take 1,000 mg by mouth every 6 (six) hours as needed for moderate pain. Reported on 04/30/2015    [provider]  albuterol (PROAIR HFA) 108 (90 Base) MCG/ACT inhaler USE 2 PUFFS EVERY 6 HOURS AS NEEDED FOR WHEEZING. 10/28/18   Panosh, Standley Brooking, MD  albuterol (PROVENTIL) (2.5 MG/3ML) 0.083% nebulizer solution USE 1 AMPULE EVERY 6 HOURS AS NEEDED FOR WHEEZE 08/21/18   Panosh, Standley Brooking, MD  amLODipine-olmesartan (AZOR) 10-40 MG tablet Take 1 tablet by mouth daily.    Madelon Lips, MD  atorvastatin (LIPITOR) 20 MG tablet TAKE 1 TABLET ONCE DAILY. 08/26/18   Panosh, Standley Brooking, MD  carvedilol (COREG) 12.5 MG tablet Take 6.25 mg by mouth daily. 1/2 tablet daily    Madelon Lips, MD  citalopram (CELEXA) 20 MG tablet TAKE 1 TABLET ONCE DAILY. 07/15/18   Panosh, Standley Brooking, MD  clopidogrel (PLAVIX) 75 MG tablet TAKE 1 TABLET ONCE DAILY. 10/09/18   Panosh, Standley Brooking, MD  diclofenac sodium (VOLTAREN) 1 % GEL APPLY 4GM TO AFFECTED AREA(S) 4 TIMES A DAY. 07/13/17   Panosh, Standley Brooking, MD  doxazosin (CARDURA) 2 MG tablet TAKE 3 TABLETS AT  BEDTIME. 10/28/18   Panosh, Standley Brooking, MD  ferrous sulfate 325 (65 FE) MG EC tablet Take 1 tablet (325 mg total) by mouth daily with breakfast. 12/15/14   Panosh, Standley Brooking, MD  fluorometholone (FML) 0.1 % ophthalmic suspension Place 1 drop into both eyes 2 (two) times daily.  09/13/17   [provider]  Fluticasone-Salmeterol (ADVAIR) 250-50 MCG/DOSE AEPB USE 1 PUFF TWICE DAILY. 01/31/18   Panosh, Standley Brooking, MD  furosemide (LASIX) 80 MG tablet Take 80 mg in the morning and 40 mg in the evening due to persistent lower extremity edema.    Madelon Lips, MD  hydrALAZINE (APRESOLINE) 50 MG tablet Take 50 mg by mouth 3 (three) times daily.    Madelon Lips, MD  lactobacillus acidophilus & bulgar (LACTINEX) chewable tablet Chew 1 tablet by mouth 3 (three) times daily with meals. 10/30/18   Lamptey, Myrene Galas, MD  lidocaine (LIDODERM) 5 % Place 1 patch onto the skin daily. Remove & Discard patch within 12 hours or as directed by MD Patient taking differently: Place 1 patch onto the skin daily as needed (pain). Remove & Discard patch within 12 hours or as directed by MD 01/04/17   Lysbeth Penner, FNP  LORazepam (ATIVAN) 1 MG tablet TAKE 1 TABLET EVERY 8 HOURS AS NEEDED FOR ANXIETY. AVOID REGULAR USE. 09/24/18   Panosh, Standley Brooking, MD  montelukast (SINGULAIR) 10 MG tablet TAKE 1 TABLET ONCE DAILY. 04/23/18   Panosh, Standley Brooking, MD  ondansetron (ZOFRAN ODT) 4 MG disintegrating tablet Take 1 tablet (4 mg total) by mouth every 8 (eight) hours as needed for nausea or vomiting. 10/30/18   Lamptey, Myrene Galas, MD  pantoprazole (PROTONIX) 40 MG tablet TAKE (1) TABLET TWICE A DAY BEFORE MEALS. 10/09/18   Panosh, Standley Brooking, MD  potassium chloride (K-DUR) 10 MEQ tablet TAKE 4 TABLETS DAILY. 10/09/18   Panosh, Standley Brooking, MD  potassium chloride (K-DUR,KLOR-CON) 10 MEQ tablet Take 10 mEq by mouth 4 (four) times daily.    [provider]  predniSONE (STERAPRED UNI-PAK 21 TAB) 10 MG (21) TBPK tablet Take by mouth daily. Take 6  tabs by mouth daily  for 2 days, then 5 tabs for 2 days, then 4 tabs for 2 days, and so on until done 08/02/18   Harrie Foreman, MD  RESTASIS 0.05 % ophthalmic emulsion Place 1 drop into both eyes 2 (two) times daily.  01/01/15   [provider]  Spacer/Aero-Holding Chambers (AEROCHAMBER PLUS) inhaler Use as instructed 05/26/16   Melynda Ripple, MD  vitamin B-12 (CYANOCOBALAMIN) 1000 MCG tablet Take 1,000 mcg by mouth daily.    [provider]    Physical Exam: Vitals:   11/03/18 0221 11/03/18 0331 11/03/18 0456 11/03/18 0457  BP: (!) 190/61 (!) 192/65 (!) 158/58   Pulse: 86 81 75   Resp: 19 18    Temp:  99.1 F (37.3 C)    TempSrc:  Oral    SpO2: 90% 97%    Weight:    82.2 kg  Height:        Physical Exam  Constitutional: She is oriented to person, place, and time. She appears well-developed and well-nourished. No distress.  HENT:  Head: Normocephalic.  Slightly dry mucous membranes  Eyes: Right eye exhibits no discharge. Left eye exhibits no discharge.  Neck: Neck supple.  Cardiovascular: Normal rate, regular rhythm and intact distal pulses.  Pulmonary/Chest: Effort normal and breath sounds normal. No respiratory distress. She has no wheezes. She has no rales.  Abdominal: Soft. Bowel sounds are normal. She exhibits no distension. There is abdominal tenderness. There is no rebound and no guarding.  Epigastrium mildly tender to deep palpation  Musculoskeletal:        General: Edema present.     Comments: +3 pitting edema of bilateral lower extremities (chronic per patient)  Neurological: She is alert and oriented to person, place, and time.  Skin: Skin is warm and dry. She is not diaphoretic.     Labs on Admission: I have personally reviewed following labs and imaging studies  CBC: Recent Labs  Lab 11/02/18 2048  WBC  4.0  HGB 9.1*  HCT 28.0*  MCV 88.1  PLT 349   Basic Metabolic Panel: Recent Labs  Lab 11/02/18 2048  NA 125*  K 4.1  CL 90*   CO2 23  GLUCOSE 116*  BUN 22  CREATININE 1.73*  CALCIUM 8.8*   GFR: Estimated Creatinine Clearance: 24.4 mL/min (A) (by C-G formula based on SCr of 1.73 mg/dL (H)). Liver Function Tests: Recent Labs  Lab 11/02/18 2048  AST 18  ALT 7  ALKPHOS 40  BILITOT 0.5  PROT 5.9*  ALBUMIN 3.3*   Recent Labs  Lab 11/02/18 2048  LIPASE 36   No results for input(s): AMMONIA in the last 168 hours. Coagulation Profile: No results for input(s): INR, PROTIME in the last 168 hours. Cardiac Enzymes: Recent Labs  Lab 11/02/18 2048 11/03/18 0209  TROPONINI 0.09* 0.11*   BNP (last 3 results) No results for input(s): PROBNP in the last 8760 hours. HbA1C: No results for input(s): HGBA1C in the last 72 hours. CBG: No results for input(s): GLUCAP in the last 168 hours. Lipid Profile: No results for input(s): CHOL, HDL, LDLCALC, TRIG, CHOLHDL, LDLDIRECT in the last 72 hours. Thyroid Function Tests: No results for input(s): TSH, T4TOTAL, FREET4, T3FREE, THYROIDAB in the last 72 hours. Anemia Panel: No results for input(s): VITAMINB12, FOLATE, FERRITIN, TIBC, IRON, RETICCTPCT in the last 72 hours. Urine analysis:    Component Value Date/Time   COLORURINE COLORLESS (A) 11/02/2018 2041   APPEARANCEUR CLEAR 11/02/2018 2041   LABSPEC 1.003 (L) 11/02/2018 2041   PHURINE 7.0 11/02/2018 2041   GLUCOSEU NEGATIVE 11/02/2018 2041   GLUCOSEU NEGATIVE 04/25/2016 1028   HGBUR SMALL (A) 11/02/2018 2041   BILIRUBINUR NEGATIVE 11/02/2018 2041   [place] 11/02/2018 2041   PROTEINUR 30 (A) 11/02/2018 2041   UROBILINOGEN 0.2 04/25/2016 1028   NITRITE NEGATIVE 11/02/2018 2041   LEUKOCYTESUR TRACE (A) 11/02/2018 2041    Radiological Exams on Admission: Ct Abdomen Pelvis W Contrast  Result Date: 11/02/2018 CLINICAL DATA:  Epigastric pain and vomiting. EXAM: CT ABDOMEN AND PELVIS WITH CONTRAST TECHNIQUE: Multidetector CT imaging of the abdomen and pelvis was performed using the standard  protocol following bolus administration of intravenous contrast. CONTRAST:  73mL OMNIPAQUE IOHEXOL 300 MG/ML  SOLN COMPARISON:  CT 05/03/2017 FINDINGS: Lower chest: Mild nonspecific bilateral subpleural reticulation. Multi chamber cardiomegaly. Hepatobiliary: No focal liver abnormality is seen. No gallstones, gallbladder wall thickening, or biliary dilatation. Pancreas: No ductal dilatation or inflammation. Spleen: Normal in size without focal abnormality. Adrenals/Urinary Tract: No adrenal nodule. No hydronephrosis. Mild left perinephric edema. Simple cyst in the upper left kidney measures 3.7 cm. Additional small low-density lesions in both kidneys are too small to accurately characterize. Urinary bladder is physiologically distended. No definite bladder wall thickening. Stomach/Bowel: Small hiatal hernia. Stomach is nondistended. No bowel obstruction or wall thickening. No bowel inflammation. Appendix not confidently visualized, no evidence of appendicitis. Colonic diverticulosis without diverticulitis. Moderate diffuse colonic stool burden. No colonic wall thickening. Vascular/Lymphatic: Aorto bi-iliac atherosclerosis. No aneurysm. Small left central mesenteric lymph nodes with hazy mesentery chronic and unchanged from prior. No suspicious adenopathy in the abdomen or pelvis. Reproductive: Post hysterectomy. Ovaries are quiescent. Other: No free air, free fluid, or intra-abdominal fluid collection. Musculoskeletal: Multilevel degenerative change throughout the lumbar spine. Pubic symphyseal degenerative change. There are no acute or suspicious osseous abnormalities. IMPRESSION: 1. Mild left perinephric edema, can be seen with urinary tract infection. 2. Colonic diverticulosis without diverticulitis. Small hiatal hernia. 3.  Aortic Atherosclerosis (ICD10-I70.0).  Electronically Signed   By: Keith Rake M.D.   On: 11/02/2018 23:32    EKG: Independently reviewed.  Sinus rhythm (heart rate  74).  Assessment/Plan Principal Problem:   Nausea Active Problems:   Asthma   CKD (chronic kidney disease) stage 3, GFR 30-59 ml/min (HCC)   Elevated troponin   Hyponatremia   Nausea Recently had symptoms consistent with viral gastroenteritis.  Currently only feeling nauseous but not vomiting.  Does endorse GERD symptoms.  Has mild epigastric tenderness on deep palpation on exam.  No guarding or rigidity.  Afebrile and no leukocytosis.  Lipase and LFTs normal.  Abdominal CT negative for acute finding.  Nausea likely due to GERD versus dyspepsia. -Zofran PRN -Protonix -GI cocktail PRN -Clear liquid diet at this time; advance as tolerated  Elevated troponin Troponin 0.09 >0.11 in the setting of CKD stage III-IV.  Although nausea could be an atypical presentation of ACS, EKG not suggestive of ACS.  Patient denies any chest pain and appears comfortable on exam.  Although she has risk factors, no documented history of CAD. -Cardiac monitoring -Continue to trend troponin -Obtain echocardiogram and cardiology consultation if significant troponin elevation or change in clinical status  Moderate hyponatremia Sodium 125.  Likely secondary to decreased p.o. intake and recent vomiting.  -IV fluid hydration -BMP every 4 hours -Check serum osmolarity  Hypertension Blood pressure elevated with systolic in the 919T to 660A. -Hydralazine PRN  CKD stage III-IV -Creatinine 1.7, at baseline.  Well-controlled type 2 diabetes Last A1c 5.8 in November 2019. -Repeat A1c.  Sliding scale insulin sensitive and CBG checks.  Asthma -Stable.  No bronchospasm.  Albuterol PRN.  DVT prophylaxis: Subcutaneous heparin Code Status: Patient wishes to be full code. Family Communication: No family available. Disposition Plan: Anticipate discharge after clinical improvement. Consults called: None Admission status: It is my clinical opinion that referral for OBSERVATION is reasonable and necessary in this  patient based on the above information provided. The aforementioned taken together are felt to place the patient at high risk for further clinical deterioration. However it is anticipated that the patient may be medically stable for discharge from the hospital within 24 to 48 hours.  The medical decision making on this patient was of high complexity and the patient is at high risk for clinical deterioration, therefore this is a level 3 visit.  Shela Leff MD [facility] Pager [phone]  If 7PM-7AM, please contact night-coverage [email or web address] Password [facility]  11/03/2018, 6:14 AM

## 2018-11-03 NOTE — Care Management Obs Status (Signed)
[facility] NOTIFICATION   Patient Details  Name: [name] MRN: [id number] Date of Birth: 02-27-1934   Medicare Observation Status Notification Given:  Yes    Claudie Leach, RN 11/03/2018, 3:46 PM

## 2018-11-03 NOTE — Plan of Care (Addendum)
This 85 year old female comes from home with a history of CKD stage III to stage IV anxiety type 2 diabetes hypertension hyperlipidemia has had some vomiting and diarrhea a week ago prior to admission from after each eating a sandwich from outside.  This had been resolved and now she started again with nausea after eating soup yesterday at home.  She lives with her granddaughter at home.  Work-up shows coag negative sodium 125 creatinine 1.7 UA with some proteinuria urine culture pending CT of the abdomen shows mild left perinephric edema which can be seen with UTI.  Her temp is 99.1.  Her white count is normal.  She has not had anything to eat or drink since admission.  I have ordered clear liquid diet and will advance as tolerated.  She needs to continue IV fluids for hydration as well as for hyponatremia.  She needs to be monitored overnight for hyponatremia and persistent nausea inability to eat and keep food down.   continue IV fluids.  Obtain PT consult.  TROPONIN trending up no symptoms check ekg

## 2018-11-03 NOTE — Evaluation (Addendum)
Physical Therapy Evaluation Patient Details Name: [name] MRN: [id number] DOB: 06-Aug-1933 Today's Date: 11/03/2018   History of Present Illness  Patient is a 85 y/o female who presents with N/V. Chest CT-colonic diverticulosis. Also noted to have hyponatremia. PMH includes lupus, HTN, HLD, DVT, DM.  Clinical Impression  Patient presents with generalized weakness and deconditioning s/p above. Pt lives with granddaughter and is independent PTA. Reports her granddaughter will provide support at home. Today, pt tolerated transfers and gait training with Min guard for balance/safety. Fatigues easily as pt reports she has not been as active the last few days due to feeling bad. Encouraged walking with nursing while in the hospital to improve overall strength and mobility. Will plan for stair training tomorrow as pt lives in 2nd floor apt. Will follow acutely to maximize independence and mobility prior to return home.    Follow Up Recommendations No PT follow up;Supervision - Intermittent    Equipment Recommendations  None recommended by PT    Recommendations for Other Services       Precautions / Restrictions Precautions Precautions: Fall Precaution Comments: Per MD, verbal clearance to work with pt regardless of elevated troponins. EKG unremarkable, pt asymptomatic (Dr. Rodena Piety) Restrictions Weight Bearing Restrictions: No      Mobility  Bed Mobility Overal bed mobility: Modified Independent             General bed mobility comments: HOB elevated, use of rail to get to EOB  Transfers Overall transfer level: Needs assistance Equipment used: None Transfers: Sit to/from Stand Sit to Stand: Min guard         General transfer comment: Min guard for safety. Stood from [employer], from toilet x1; transferred to chair post ambulation.  Ambulation/Gait Ambulation/Gait assistance: Min guard Gait Distance (Feet): 300 Feet Assistive device: IV Pole;None Gait  Pattern/deviations: Step-through pattern;Decreased stride length Gait velocity: decreased   General Gait Details: Slow, steady gait initially needing IV pole for support progressing to no support. VSS throughout. Fatigues.  Stairs            Wheelchair Mobility    Modified Rankin (Stroke Patients Only)       Balance Overall balance assessment: Mild deficits observed, not formally tested                                           Pertinent Vitals/Pain Pain Assessment: No/denies pain    Home Living Family/patient expects to be discharged to:: Private residence Living Arrangements: Other (Comment)(granddaughter) Available Help at Discharge: Family;Available 24 hours/day Type of Home: Apartment Home Access: Stairs to enter   Entrance Stairs-Number of Steps: 1 flight Home Layout: One level Home Equipment: None      Prior Function Level of Independence: Independent         Comments: Drives. Recently stopped working caring for children.     Hand Dominance        Extremity/Trunk Assessment   Upper Extremity Assessment Upper Extremity Assessment: Defer to OT evaluation    Lower Extremity Assessment Lower Extremity Assessment: Generalized weakness(however strength functional)    Cervical / Trunk Assessment Cervical / Trunk Assessment: Kyphotic  Communication   Communication: No difficulties  Cognition Arousal/Alertness: Awake/alert Behavior During Therapy: WFL for tasks assessed/performed Overall Cognitive Status: Within Functional Limits for tasks assessed  General Comments      Exercises     Assessment/Plan    PT Assessment Patient needs continued PT services  PT Problem List Decreased strength;Decreased mobility;Decreased activity tolerance       PT Treatment Interventions Therapeutic activities;Gait training;Stair training;Functional mobility training;Patient/family  education    PT Goals (Current goals can be found in the Care Plan section)  Acute Rehab PT Goals Patient Stated Goal: to be able to eat and feel better PT Goal Formulation: With patient Time For Goal Achievement: 11/17/18 Potential to Achieve Goals: Good    Frequency Min 3X/week   Barriers to discharge Inaccessible home environment 1 flight to get to 2nd floor apt    Co-evaluation               AM-PAC PT "6 Clicks" Mobility  Outcome Measure Help needed turning from your back to your side while in a flat bed without using bedrails?: A Little Help needed moving from lying on your back to sitting on the side of a flat bed without using bedrails?: A Little Help needed moving to and from a bed to a chair (including a wheelchair)?: None Help needed standing up from a chair using your arms (e.g., wheelchair or bedside chair)?: None Help needed to walk in hospital room?: A Little Help needed climbing 3-5 steps with a railing? : A Little 6 Click Score: 20    End of Session Equipment Utilized During Treatment: Gait belt Activity Tolerance: Patient tolerated treatment well Patient left: in chair;with call bell/phone within reach Nurse Communication: Mobility status PT Visit Diagnosis: Unsteadiness on feet (R26.81);Muscle weakness (generalized) (M62.81)    Time: 5621-3086 PT Time Calculation (min) (ACUTE ONLY): 20 min   Charges:   PT Evaluation $PT Eval Low Complexity: 1 Low          Wray Kearns, PT, DPT Acute Rehabilitation Services Pager [phone] Office [phone]      Marguarite Arbour A Sabra Heck 11/03/2018, 2:39 PM

## 2018-11-03 NOTE — Progress Notes (Signed)
CRITICAL VALUE ALERT  Critical Value:  Troponin 0.13  Date & Time Notied:  11/03/2018 at 1217  Provider Notified: Rodena Piety, MD   Orders Received/Actions taken: continue to monitor patient

## 2018-11-03 NOTE — ED Notes (Signed)
[name]- [phone] would like an update

## 2018-11-04 ENCOUNTER — Telehealth: Payer: Self-pay | Admitting: *Deleted

## 2018-11-04 ENCOUNTER — Telehealth: Payer: Self-pay

## 2018-11-04 DIAGNOSIS — R110 Nausea: Secondary | ICD-10-CM | POA: Diagnosis not present

## 2018-11-04 LAB — BASIC METABOLIC PANEL
Anion gap: 7 (ref 5–15)
BUN: 18 mg/dL (ref 8–23)
CO2: 26 mmol/L (ref 22–32)
Calcium: 8.7 mg/dL — ABNORMAL LOW (ref 8.9–10.3)
Chloride: 101 mmol/L (ref 98–111)
Creatinine, Ser: 1.67 mg/dL — ABNORMAL HIGH (ref 0.44–1.00)
GFR calc Af Amer: 33 mL/min — ABNORMAL LOW (ref >60)
GFR calc non Af Amer: 28 mL/min — ABNORMAL LOW (ref >60)
Glucose, Bld: 102 mg/dL — ABNORMAL HIGH (ref 70–99)
Potassium: 3.7 mmol/L (ref 3.5–5.1)
Sodium: 134 mmol/L — ABNORMAL LOW (ref 135–145)

## 2018-11-04 LAB — URINE CULTURE: Culture: <10000 — AB

## 2018-11-04 LAB — HEMOGLOBIN A1C
Hgb A1c MFr Bld: 5.7 % — ABNORMAL HIGH (ref 4.8–5.6)
Mean Plasma Glucose: 117 mg/dL

## 2018-11-04 LAB — GLUCOSE, CAPILLARY
Glucose-Capillary: 102 mg/dL — ABNORMAL HIGH (ref 70–99)
Glucose-Capillary: 127 mg/dL — ABNORMAL HIGH (ref 70–99)

## 2018-11-04 LAB — TROPONIN I: Troponin I: 0.12 ng/mL (ref <0.03)

## 2018-11-04 MED ORDER — POTASSIUM CHLORIDE ER 10 MEQ PO TBCR: 10.0000 meq | EXTENDED_RELEASE_TABLET | Freq: Every day | ORAL | 0 refills | Status: DC

## 2018-11-04 MED ORDER — FUROSEMIDE 80 MG PO TABS: 40.0000 mg | ORAL_TABLET | Freq: Every day | ORAL | 1 refills | Status: DC

## 2018-11-04 MED ORDER — HYDRALAZINE HCL 50 MG PO TABS: 25.0000 mg | ORAL_TABLET | Freq: Two times a day (BID) | ORAL | 1 refills | Status: DC

## 2018-11-04 NOTE — Telephone Encounter (Signed)
Transition Care Management Follow-up Telephone Call   Date discharged? 11/04/2018  How have you been since you were released from the hospital? " Better"  Do you understand why you were in the hospital? yes   Do you understand the discharge instructions? yes   Where were you discharged to? Home    Items Reviewed:  Medications reviewed: yes  Allergies reviewed: yes  Dietary changes reviewed: yes Heart Healthy and low sodium   Referrals reviewed: N/A    Functional Questionnaire:   Activities of Daily Living (ADLs):   She states they are independent in the following: ambulation, bathing and hygiene, feeding, continence, grooming, toileting and dressing States they require assistance with the following: N/A    Any transportation issues/concerns?: no   Any patient concerns? no   Confirmed importance and date/time of follow-up visits scheduled yes  Provider Appointment booked with  Dr. Regis Bill 11/06/2018 at 3:00 PM (virtually)  Confirmed with patient if condition begins to worsen call PCP or go to the ER.  Patient was given the office number and encouraged to call back with question or concerns.  : yes

## 2018-11-04 NOTE — Discharge Instructions (Signed)

## 2018-11-04 NOTE — Telephone Encounter (Signed)

## 2018-11-04 NOTE — Discharge Summary (Signed)
Physician Discharge Summary  [name] PPI:[id number] DOB: 05-07-1934 DOA: 11/02/2018  PCP: Burnis Medin, MD  Admit date: 11/02/2018 Discharge date: 11/04/2018  Admitted From: Home Disposition: Home Recommendations for Outpatient Follow-up:  1. Follow up with PCP in 1-2 weeks 2. Please obtain BMP/CBC in one week 3. Follow-up with Dr. Acie Fredrickson  Home Health none Equipment/Devices: None  Discharge Condition: Stable and improved CODE STATUS: Full code Diet recommendation cardiac Brief/Interim Summary:85 y.o. female with medical history significant of anemia, anxiety, arthritis, asthma, type 2 diabetes, GERD, DVT, hypertension, hyperlipidemia, lupus, CKD stage III-IV, and conditions listed below presenting to the hospital for evaluation of nausea.  Patient states a week ago she had a bologna sandwich and started having vomiting and diarrhea soon after.  She then went to an urgent care center 5 days ago and was given a medicine for nausea.  States her diarrhea resolved a few days ago and nausea improved.  Today after eating soup she started feeling nauseous again but did not vomit.  She was later able to eat applesauce and keep it down.  Reports having some epigastric discomfort but no abdominal pain otherwise.  States she has acid reflux for which she takes a medicine.  Denies any fevers or chills.  Denies any chest pain at rest or with exertion.  Denies any dysuria.  ED Course: Afebrile, not tachycardic.  Blood pressure elevated with systolic in the 063K to 160F.  No leukocytosis.  Hemoglobin 9.1, was 9.5 seven months ago.  Sodium 125, chloride 90.  Blood glucose 116.  BUN 22.  Creatinine 1.7, at baseline.  Lipase and LFTs normal.  UA not suggestive of infection.  Urine culture pending.  COVID-19 rapid test pending.  Troponin 0.09.  EKG not suggestive of ACS.  CT showing colonic diverticulosis without diverticulitis.  Patient received a 500 cc fluid bolus and Zofran in the ED.  Discharge  Diagnoses:  Principal Problem:   Nausea Active Problems:   Asthma   CKD (chronic kidney disease) stage 3, GFR 30-59 ml/min (HCC)   Elevated troponin   Hyponatremia  Nausea-patient was admitted to telemetry floor.  Her nausea improved within 24 hours with symptomatic treatment.  She was treated with IV fluids and Zofran.  Was able to tolerate a diet prior to discharge.  Her labs including lipase LFTs and white count were normal.  Abdominal CT showed no acute findings.  Elevated troponin her troponin was elevated at 0.09 on admission and up to 0.13 at the time of discharge.  This was thought to be secondary to her CKD.  She did not have any symptoms of chest pain shortness of breath.  Discussed with cardiology Dr. Marlou Porch will plan discharge home today and follow-up with them as an outpatient.  Continue all her cardiac medications prior to admission.  Moderate hyponatremia Sodium 125.  Likely secondary to decreased p.o. intake and recent vomiting.   This improved with normal saline sodium at 134 on the day of discharge.  Hypertension continue antihypertensives.  I have decreased the dose of hydralazine 25 mg twice a day.  CKD stage III renal function stable creatinine 1.67 on discharge. -Creatinine 1.7, at baseline.  Well-controlled type 2 diabetes Last A1c 5.8 in November 2019.  Asthma -Stable.  No bronchospasm.  Albuterol PRN.  Estimated body mass index is 34.8 kg/m as calculated from the following:   Height as of this encounter: 5\' 1"  (1.549 m).   Weight as of this encounter: 83.6 kg.  Discharge Instructions  Discharge Instructions    Call MD for:  difficulty breathing, headache or visual disturbances   Complete by: As directed    Call MD for:  persistant dizziness or light-headedness   Complete by: As directed    Call MD for:  persistant nausea and vomiting   Complete by: As directed    Diet - low sodium heart healthy   Complete by: As directed    Increase activity  slowly   Complete by: As directed      Allergies as of 11/04/2018      Reactions   Penicillins Nausea And Vomiting, Other (See Comments)   Has patient had a PCN reaction causing immediate rash, facial/tongue/throat swelling, SOB or lightheadedness with hypotension: Y Has patient had a PCN reaction causing severe rash involving mucus membranes or skin necrosis: Y Has patient had a PCN reaction that required hospitalization: N Has patient had a PCN reaction occurring within the last 10 years: N If all of the above answers are "NO", then may proceed with Cephalosporin use.   Aspirin Other (See Comments)   Wheezing Acetaminophen is OK      Medication List    STOP taking these medications   potassium chloride 10 MEQ tablet Commonly known as: K-DUR   predniSONE 10 MG (21) Tbpk tablet Commonly known as: STERAPRED UNI-PAK 21 TAB     TAKE these medications   acetaminophen 500 MG tablet Commonly known as: TYLENOL Take 1,000 mg by mouth every 6 (six) hours as needed for moderate pain. Reported on 04/30/2015   AeroChamber Plus inhaler Use as instructed   albuterol (2.5 MG/3ML) 0.083% nebulizer solution Commonly known as: PROVENTIL USE 1 AMPULE EVERY 6 HOURS AS NEEDED FOR WHEEZE   albuterol 108 (90 Base) MCG/ACT inhaler Commonly known as: ProAir HFA USE 2 PUFFS EVERY 6 HOURS AS NEEDED FOR WHEEZING.   amLODipine-olmesartan 10-40 MG tablet Commonly known as: AZOR Take 1 tablet by mouth daily.   atorvastatin 20 MG tablet Commonly known as: LIPITOR TAKE 1 TABLET ONCE DAILY.   carvedilol 12.5 MG tablet Commonly known as: COREG Take 6.25 mg by mouth daily. 1/2 tablet daily   citalopram 20 MG tablet Commonly known as: CELEXA TAKE 1 TABLET ONCE DAILY.   clopidogrel 75 MG tablet Commonly known as: PLAVIX TAKE 1 TABLET ONCE DAILY.   diclofenac sodium 1 % Gel Commonly known as: VOLTAREN APPLY 4GM TO AFFECTED AREA(S) 4 TIMES A DAY.   doxazosin 2 MG tablet Commonly known as:  CARDURA TAKE 3 TABLETS AT BEDTIME.   ferrous sulfate 325 (65 FE) MG EC tablet Take 1 tablet (325 mg total) by mouth daily with breakfast.   fluorometholone 0.1 % ophthalmic suspension Commonly known as: FML Place 1 drop into both eyes 2 (two) times daily.   Fluticasone-Salmeterol 250-50 MCG/DOSE Aepb Commonly known as: ADVAIR USE 1 PUFF TWICE DAILY.   furosemide 80 MG tablet Commonly known as: LASIX Take 0.5 tablets (40 mg total) by mouth daily. Take 80 mg in the morning and 40 mg in the evening due to persistent lower extremity edema. What changed:   how much to take  how to take this  when to take this   hydrALAZINE 50 MG tablet Commonly known as: APRESOLINE Take 0.5 tablets (25 mg total) by mouth 2 (two) times a day. What changed:   how much to take  when to take this   lactobacillus acidophilus & bulgar chewable tablet Chew 1 tablet by mouth 3 (three) times daily with  meals.   lidocaine 5 % Commonly known as: Lidoderm Place 1 patch onto the skin daily. Remove & Discard patch within 12 hours or as directed by MD What changed:   when to take this  reasons to take this   LORazepam 1 MG tablet Commonly known as: ATIVAN TAKE 1 TABLET EVERY 8 HOURS AS NEEDED FOR ANXIETY. AVOID REGULAR USE.   montelukast 10 MG tablet Commonly known as: SINGULAIR TAKE 1 TABLET ONCE DAILY.   ondansetron 4 MG disintegrating tablet Commonly known as: Zofran ODT Take 1 tablet (4 mg total) by mouth every 8 (eight) hours as needed for nausea or vomiting.   pantoprazole 40 MG tablet Commonly known as: PROTONIX TAKE (1) TABLET TWICE A DAY BEFORE MEALS.   potassium chloride 10 MEQ tablet Commonly known as: K-DUR Take 1 tablet (10 mEq total) by mouth daily. What changed: how much to take   Restasis 0.05 % ophthalmic emulsion Generic drug: cycloSPORINE Place 1 drop into both eyes 2 (two) times daily.   vitamin B-12 1000 MCG tablet Commonly known as: CYANOCOBALAMIN Take 1,000  mcg by mouth daily.      Follow-up Information    Nahser, Wonda Cheng, MD Follow up.   Specialty: Cardiology Contact information: [address] [place] [address] [phone]        Burnis Medin, MD Follow up.   Specialties: Internal Medicine, Pediatrics Contact information: [address] [place] [address] [phone]          Allergies  Allergen Reactions  . Penicillins Nausea And Vomiting and Other (See Comments)    Has patient had a PCN reaction causing immediate rash, facial/tongue/throat swelling, SOB or lightheadedness with hypotension: Y Has patient had a PCN reaction causing severe rash involving mucus membranes or skin necrosis: Y Has patient had a PCN reaction that required hospitalization: N Has patient had a PCN reaction occurring within the last 10 years: N If all of the above answers are "NO", then may proceed with Cephalosporin use.   . Aspirin Other (See Comments)    Wheezing Acetaminophen is OK    Consultations: None  Procedures/Studies: Ct Abdomen Pelvis W Contrast  Result Date: 11/02/2018 CLINICAL DATA:  Epigastric pain and vomiting. EXAM: CT ABDOMEN AND PELVIS WITH CONTRAST TECHNIQUE: Multidetector CT imaging of the abdomen and pelvis was performed using the standard protocol following bolus administration of intravenous contrast. CONTRAST:  37mL OMNIPAQUE IOHEXOL 300 MG/ML  SOLN COMPARISON:  CT 05/03/2017 FINDINGS: Lower chest: Mild nonspecific bilateral subpleural reticulation. Multi chamber cardiomegaly. Hepatobiliary: No focal liver abnormality is seen. No gallstones, gallbladder wall thickening, or biliary dilatation. Pancreas: No ductal dilatation or inflammation. Spleen: Normal in size without focal abnormality. Adrenals/Urinary Tract: No adrenal nodule. No hydronephrosis. Mild left perinephric edema. Simple cyst in the upper left kidney measures 3.7 cm. Additional small low-density lesions in both kidneys are  too small to accurately characterize. Urinary bladder is physiologically distended. No definite bladder wall thickening. Stomach/Bowel: Small hiatal hernia. Stomach is nondistended. No bowel obstruction or wall thickening. No bowel inflammation. Appendix not confidently visualized, no evidence of appendicitis. Colonic diverticulosis without diverticulitis. Moderate diffuse colonic stool burden. No colonic wall thickening. Vascular/Lymphatic: Aorto bi-iliac atherosclerosis. No aneurysm. Small left central mesenteric lymph nodes with hazy mesentery chronic and unchanged from prior. No suspicious adenopathy in the abdomen or pelvis. Reproductive: Post hysterectomy. Ovaries are quiescent. Other: No free air, free fluid, or intra-abdominal fluid collection. Musculoskeletal: Multilevel degenerative change throughout the lumbar spine.  Pubic symphyseal degenerative change. There are no acute or suspicious osseous abnormalities. IMPRESSION: 1. Mild left perinephric edema, can be seen with urinary tract infection. 2. Colonic diverticulosis without diverticulitis. Small hiatal hernia. 3.  Aortic Atherosclerosis (ICD10-I70.0). Electronically Signed   By: Keith Rake M.D.   On: 11/02/2018 23:32    (Echo, Carotid, EGD, Colonoscopy, ERCP)    Subjective: Patient resting in bed anxious to go home feels better able to tolerate diet no nausea no chest pain no vomiting.  Discharge Exam: Vitals:   11/03/18 2119 11/04/18 0404  BP: (!) 172/62 (!) 138/58  Pulse: 67 67  Resp:    Temp: 98.3 F (36.8 C) 98.8 F (37.1 C)  SpO2: 96% 97%   Vitals:   11/03/18 0457 11/03/18 1437 11/03/18 2119 11/04/18 0404  BP:  (!) 157/56 (!) 172/62 (!) 138/58  Pulse:  78 67 67  Resp:      Temp:  98.2 F (36.8 C) 98.3 F (36.8 C) 98.8 F (37.1 C)  TempSrc:  Oral Oral Oral  SpO2:  96% 96% 97%  Weight: 82.2 kg   83.6 kg  Height:        General: Pt is alert, awake, not in acute distress Cardiovascular: RRR, S1/S2 +, no rubs,  no gallops Respiratory: CTA bilaterally, no wheezing, no rhonchi Abdominal: Soft, NT, ND, bowel sounds + Extremities: no edema, no cyanosis    The results of significant diagnostics from this hospitalization (including imaging, microbiology, ancillary and laboratory) are listed below for reference.     Microbiology: Recent Results (from the past 240 hour(s))  Urine Culture     Status: Abnormal   Collection Time: 11/03/18 12:28 AM   Specimen: Urine, Catheterized  Result Value Ref Range Status   Specimen Description URINE, CATHETERIZED  Final   Special Requests NONE  Final   Culture (A)  Final    <10,000 COLONIES/mL INSIGNIFICANT GROWTH Performed at [facility], [place] [address] [place], [place] [address]    Report Status 11/04/2018 FINAL  Final  SARS Coronavirus 2 (CEPHEID - Performed in [place] hospital lab), Hosp Order     Status: None   Collection Time: 11/03/18 12:50 AM   Specimen: Nasopharyngeal Swab  Result Value Ref Range Status   SARS Coronavirus 2 NEGATIVE NEGATIVE Final    Comment: (NOTE) If result is NEGATIVE SARS-CoV-2 target nucleic acids are NOT DETECTED. The SARS-CoV-2 RNA is generally detectable in upper and lower  respiratory specimens during the acute phase of infection. The lowest  concentration of SARS-CoV-2 viral copies this assay can detect is 250  copies / mL. A negative result does not preclude SARS-CoV-2 infection  and should not be used as the sole basis for treatment or other  patient management decisions.  A negative result may occur with  improper specimen collection / handling, submission of specimen other  than nasopharyngeal swab, presence of viral mutation(s) within the  areas targeted by this assay, and inadequate number of viral copies  (<250 copies / mL). A negative result must be combined with clinical  observations, patient history, and epidemiological information. If result is POSITIVE SARS-CoV-2 target nucleic acids are  DETECTED. The SARS-CoV-2 RNA is generally detectable in upper and lower  respiratory specimens dur ing the acute phase of infection.  Positive  results are indicative of active infection with SARS-CoV-2.  Clinical  correlation with patient history and other diagnostic information is  necessary to determine patient infection status.  Positive results do  not  rule out bacterial infection or co-infection with other viruses. If result is PRESUMPTIVE POSTIVE SARS-CoV-2 nucleic acids MAY BE PRESENT.   A presumptive positive result was obtained on the submitted specimen  and confirmed on repeat testing.  While 2019 novel coronavirus  (SARS-CoV-2) nucleic acids may be present in the submitted sample  additional confirmatory testing may be necessary for epidemiological  and / or clinical management purposes  to differentiate between  SARS-CoV-2 and other Sarbecovirus currently known to infect humans.  If clinically indicated additional testing with an alternate test  methodology [phone]) is advised. The SARS-CoV-2 RNA is generally  detectable in upper and lower respiratory sp ecimens during the acute  phase of infection. The expected result is Negative. Fact Sheet for Patients:  StrictlyIdeas.no Fact Sheet for Healthcare Providers: BankingDealers.co.za This test is not yet approved or cleared by the [place] FDA and has been authorized for detection and/or diagnosis of SARS-CoV-2 by FDA under an Emergency Use Authorization (EUA).  This EUA will remain in effect (meaning this test can be used) for the duration of the COVID-19 declaration under Section 564(b)(1) of the Act, 21 U.S.C. section 360bbb-3(b)(1), unless the authorization is terminated or revoked sooner. Performed at [facility], [place] [address], [place], [place] [address]      Labs: BNP (last 3 results) No results for input(s): BNP in the last 8760 hours. Basic  Metabolic Panel: Recent Labs  Lab 11/02/18 2048 11/03/18 1048 11/04/18 0519  NA 125* 131* 134*  K 4.1 3.8 3.7  CL 90* 97* 101  CO2 23 24 26   GLUCOSE 116* 124* 102*  BUN 22 17 18   CREATININE 1.73* 1.56* 1.67*  CALCIUM 8.8* 8.8* 8.7*   Liver Function Tests: Recent Labs  Lab 11/02/18 2048  AST 18  ALT 7  ALKPHOS 40  BILITOT 0.5  PROT 5.9*  ALBUMIN 3.3*   Recent Labs  Lab 11/02/18 2048  LIPASE 36   No results for input(s): AMMONIA in the last 168 hours. CBC: Recent Labs  Lab 11/02/18 2048  WBC 4.0  HGB 9.1*  HCT 28.0*  MCV 88.1  PLT 201   Cardiac Enzymes: Recent Labs  Lab 11/02/18 2048 11/03/18 0209 11/03/18 1048 11/03/18 1252  TROPONINI 0.09* 0.11* 0.13* 0.13*   BNP: Invalid input(s): POCBNP CBG: Recent Labs  Lab 11/03/18 0800 11/03/18 1153 11/03/18 1651 11/03/18 2123 11/04/18 0835  GLUCAP 110* 107* 105* 103* 102*   D-Dimer No results for input(s): DDIMER in the last 72 hours. Hgb A1c Recent Labs    11/03/18 1048  HGBA1C 5.7*   Lipid Profile No results for input(s): CHOL, HDL, LDLCALC, TRIG, CHOLHDL, LDLDIRECT in the last 72 hours. Thyroid function studies No results for input(s): TSH, T4TOTAL, T3FREE, THYROIDAB in the last 72 hours.  Invalid input(s): FREET3 Anemia work up No results for input(s): VITAMINB12, FOLATE, FERRITIN, TIBC, IRON, RETICCTPCT in the last 72 hours. Urinalysis    Component Value Date/Time   COLORURINE COLORLESS (A) 11/02/2018 2041   APPEARANCEUR CLEAR 11/02/2018 2041   LABSPEC 1.003 (L) 11/02/2018 2041   PHURINE 7.0 11/02/2018 2041   GLUCOSEU NEGATIVE 11/02/2018 2041   GLUCOSEU NEGATIVE 04/25/2016 1028   HGBUR SMALL (A) 11/02/2018 2041   BILIRUBINUR NEGATIVE 11/02/2018 2041   [place] 11/02/2018 2041   PROTEINUR 30 (A) 11/02/2018 2041   UROBILINOGEN 0.2 04/25/2016 1028   NITRITE NEGATIVE 11/02/2018 2041   LEUKOCYTESUR TRACE (A) 11/02/2018 2041   Sepsis Labs Invalid input(s): PROCALCITONIN,   WBC,  [place] Microbiology Recent Results (from the past 240 hour(s))  Urine Culture     Status: Abnormal   Collection Time: 11/03/18 12:28 AM   Specimen: Urine, Catheterized  Result Value Ref Range Status   Specimen Description URINE, CATHETERIZED  Final   Special Requests NONE  Final   Culture (A)  Final    <10,000 COLONIES/mL INSIGNIFICANT GROWTH Performed at [facility], [address] [place], [place] [address]    Report Status 11/04/2018 FINAL  Final  SARS Coronavirus 2 (CEPHEID - Performed in [place] hospital lab), Hosp Order     Status: None   Collection Time: 11/03/18 12:50 AM   Specimen: Nasopharyngeal Swab  Result Value Ref Range Status   SARS Coronavirus 2 NEGATIVE NEGATIVE Final    Comment: (NOTE) If result is NEGATIVE SARS-CoV-2 target nucleic acids are NOT DETECTED. The SARS-CoV-2 RNA is generally detectable in upper and lower  respiratory specimens during the acute phase of infection. The lowest  concentration of SARS-CoV-2 viral copies this assay can detect is 250  copies / mL. A negative result does not preclude SARS-CoV-2 infection  and should not be used as the sole basis for treatment or other  patient management decisions.  A negative result may occur with  improper specimen collection / handling, submission of specimen other  than nasopharyngeal swab, presence of viral mutation(s) within the  areas targeted by this assay, and inadequate number of viral copies  (<250 copies / mL). A negative result must be combined with clinical  observations, patient history, and epidemiological information. If result is POSITIVE SARS-CoV-2 target nucleic acids are DETECTED. The SARS-CoV-2 RNA is generally detectable in upper and lower  respiratory specimens dur ing the acute phase of infection.  Positive  results are indicative of active infection with SARS-CoV-2.  Clinical  correlation with patient history and other diagnostic information is  necessary  to determine patient infection status.  Positive results do  not rule out bacterial infection or co-infection with other viruses. If result is PRESUMPTIVE POSTIVE SARS-CoV-2 nucleic acids MAY BE PRESENT.   A presumptive positive result was obtained on the submitted specimen  and confirmed on repeat testing.  While 2019 novel coronavirus  (SARS-CoV-2) nucleic acids may be present in the submitted sample  additional confirmatory testing may be necessary for epidemiological  and / or clinical management purposes  to differentiate between  SARS-CoV-2 and other Sarbecovirus currently known to infect humans.  If clinically indicated additional testing with an alternate test  methodology [phone]) is advised. The SARS-CoV-2 RNA is generally  detectable in upper and lower respiratory sp ecimens during the acute  phase of infection. The expected result is Negative. Fact Sheet for Patients:  StrictlyIdeas.no Fact Sheet for Healthcare Providers: BankingDealers.co.za This test is not yet approved or cleared by the [place] FDA and has been authorized for detection and/or diagnosis of SARS-CoV-2 by FDA under an Emergency Use Authorization (EUA).  This EUA will remain in effect (meaning this test can be used) for the duration of the COVID-19 declaration under Section 564(b)(1) of the Act, 21 U.S.C. section 360bbb-3(b)(1), unless the authorization is terminated or revoked sooner. Performed at [facility], [place] [address] [place], [place] [address]      Time coordinating discharge:  34 minutes  SIGNED:   Georgette Shell, MD  [facility] 11/04/2018, 10:09 AM Pager   If 7PM-7AM, please contact night-coverage [email or web address] Password [email or web address]

## 2018-11-05 ENCOUNTER — Ambulatory Visit (INDEPENDENT_AMBULATORY_CARE_PROVIDER_SITE_OTHER): Payer: Medicare Other | Admitting: Cardiovascular Disease

## 2018-11-05 ENCOUNTER — Telehealth: Payer: Self-pay | Admitting: Cardiovascular Disease

## 2018-11-05 ENCOUNTER — Encounter: Payer: Self-pay | Admitting: Cardiovascular Disease

## 2018-11-05 ENCOUNTER — Other Ambulatory Visit: Payer: Self-pay

## 2018-11-05 VITALS — BP 156/62 | HR 67 | Ht 61.0 in | Wt 180.4 lb

## 2018-11-05 DIAGNOSIS — R778 Other specified abnormalities of plasma proteins: Secondary | ICD-10-CM

## 2018-11-05 DIAGNOSIS — I272 Pulmonary hypertension, unspecified: Secondary | ICD-10-CM

## 2018-11-05 DIAGNOSIS — R7989 Other specified abnormal findings of blood chemistry: Secondary | ICD-10-CM | POA: Diagnosis not present

## 2018-11-05 DIAGNOSIS — I10 Essential (primary) hypertension: Secondary | ICD-10-CM

## 2018-11-05 NOTE — Patient Instructions (Signed)
Medication Instructions:  Your provider recommends that you continue on your current medications as directed. Please refer to the Current Medication list given to you today.    Testing/Procedures: Your provider has requested that you have a lexiscan myoview. For further information please visit [email or web address]. Please follow instruction sheet, as given.  Your provider has requested that you have an echocardiogram. Echocardiography is a painless test that uses sound waves to create images of your heart. It provides your doctor with information about the size and shape of your heart and how well your heart's chambers and valves are working. This procedure takes approximately one hour. There are no restrictions for this procedure.  Follow-Up: Your provider recommends that you schedule a follow-up appointment in 3 months with Richardson Dopp, PA.

## 2018-11-05 NOTE — Progress Notes (Signed)
Cardiology    Patient ID: EVERLINE Lopez MRN: [id number]; DOB: [birth year]    Primary Care Provider: Burnis Medin, MD Primary Cardiologist: Mertie Moores, MD  Primary Electrophysiologist:  None   Problem list 1.  Hypertension 2.  Hyperlipidemia 3.  Prior DVT 4.  Lupus 5.  Chronic kidney disease 6.  Syncope 7.  Leg edema   Chief Complaint:  Syncope   Patient Profile:   [name] is a 85 y.o. female with history of hypertension and hyperlipidemia.  She was recently seen by Richardson Dopp, PA for an episode of syncope.  Seen in the past for an episode of leg swelling in 2015.      [name] is seen for further evaluation following her episode of syncope in July.  During that time, her metoprolol had just been changed to carvedilol.   Echocardiogram revealed normal left ventricular systolic function.  She has moderate pulmonary hypertension with an estimated PA pressure of 45. V Q scan was low risk for pulmonary embolus. She had a 30 day monitor that revealed normal sinus rhythm and sinus bradycardia.  She had one 5 beat run of ventricular ectopy which was not significant.  No further episodes of syncope  BP is still elevated.   Still eats some salty foods.   July, 23, 2020 :  [name] is seen for follow up visit BP is still mildy elevated.  Was hospitalized this past weekend.   Has vomitting and diarrhea.   These have resolved.  Troponin was 0.12   Still eating a very salty diet  Legs remains swollen .   Denies any chest pain .  Breathing is better.  Last echo was 5/19 which revealed normal LV function. Moderate pulmonary HTN - PA pressure of 45 .    Past Medical History:  Diagnosis Date  . Acute superficial venous thrombosis of left lower extremity 03/27/2014   concern about hx and potential extensive on exam tenderness calf and low dose lovenox 40 qd 2-4 weekselevetion and close  fu advised .   [name] Allergy   . Anemia   . Anxiety   . Arthritis    "shoulders" (09/09/2012)  . Asthma 09/08/2012  . Carotid bruit    [place] 2018  low risk 1 - 39%  . Diabetes mellitus without complication ([place])    "Borderline" per pt; being monitored.  no meds per pt  . Dyspnea 04/27/2014  . GERD (gastroesophageal reflux disease)   . Headache(784.0)    "related to my high blood pressure" (09/09/2012)  . History of DVT (deep vein thrombosis)    "RLE" (09/09/2012) coumadine cant take asa so on plavix   . HTN (hypertension) 09/08/2012  . Hyperlipidemia   . Lupus ([place])    "cured years ago" (09/09/2012)  . Other dysphagia 02/11/2013  . Syncope and collapse 01/21/2018  . Varicose veins   . Varicose veins of leg with complications 08/05/5571    Past Surgical History:  Procedure Laterality Date  . ABDOMINAL HYSTERECTOMY     partial  . BREAST EXCISIONAL BIOPSY Right   . BREAST SURGERY    . CARPAL TUNNEL RELEASE Right 2000's  . CARPAL TUNNEL RELEASE Bilateral 10/21/2013   Procedure: BILATERAL  CARPAL TUNNEL RELEASE;  Surgeon: [name], MD;  Location: [facility];  Service: Orthopedics;  Laterality: Bilateral;  . SHOULDER OPEN ROTATOR CUFF REPAIR Bilateral 2000's  . TRIGGER FINGER RELEASE Right 10/21/2013   Procedure: RELEASE TRIGGER FINGER/A-1 PULLEY RIGHT MIDDLE AND  RIGHT RING;  Surgeon: [name], MD;  Location: [facility];  Service: Orthopedics;  Laterality: Right;     Medications Prior to Admission: Prior to Admission medications   Medication Sig Start Date End Date Taking? Authorizing Provider  acetaminophen (TYLENOL) 500 MG tablet Take 1,000 mg by mouth every 6 (six) hours as needed for moderate pain. Reported on 04/30/2015   Yes [provider]  albuterol (PROVENTIL) (2.5 MG/3ML) 0.083% nebulizer solution USE 1 AMPULE EVERY 6 HOURS AS NEEDED FOR WHEEZE 10/23/17  Yes Panosh, Standley Brooking, MD  amLODipine-olmesartan (AZOR) 10-40 MG tablet Take 1 tablet by mouth daily.   Yes Madelon Lips, MD  carvedilol (COREG) 12.5 MG  tablet Take 6.25 mg by mouth daily. 1/2 tablet daily   Yes Madelon Lips, MD  citalopram (CELEXA) 20 MG tablet TAKE 1 TABLET ONCE DAILY. 12/14/17  Yes Panosh, Standley Brooking, MD  clopidogrel (PLAVIX) 75 MG tablet TAKE 1 TABLET ONCE DAILY. 10/19/17  Yes Panosh, Standley Brooking, MD  diclofenac sodium (VOLTAREN) 1 % GEL APPLY 4GM TO AFFECTED AREA(S) 4 TIMES A DAY. 07/13/17  Yes Panosh, Standley Brooking, MD  doxazosin (CARDURA) 2 MG tablet TAKE 3 TABLETS AT BEDTIME. 12/13/17  Yes Panosh, Standley Brooking, MD  fenofibrate 160 MG tablet TAKE 1 TABLET ONCE DAILY. 12/13/17  Yes Panosh, Standley Brooking, MD  ferrous sulfate 325 (65 FE) MG EC tablet Take 1 tablet (325 mg total) by mouth daily with breakfast. 12/15/14  Yes Panosh, Standley Brooking, MD  fluorometholone (FML) 0.1 % ophthalmic suspension Place 1 drop into both eyes 2 (two) times daily.  09/13/17  Yes [provider]  Fluticasone-Salmeterol (ADVAIR) 250-50 MCG/DOSE AEPB USE 1 PUFF TWICE DAILY. 11/09/17  Yes Panosh, Standley Brooking, MD  furosemide (LASIX) 80 MG tablet Take 80 mg by mouth daily.   Yes Madelon Lips, MD  hydrALAZINE (APRESOLINE) 25 MG tablet Take 1 tablet by mouth daily. 12/03/17  Yes [provider]  lidocaine (LIDODERM) 5 % Place 1 patch onto the skin daily. Remove & Discard patch within 12 hours or as directed by MD Patient taking differently: Place 1 patch onto the skin daily as needed (pain). Remove & Discard patch within 12 hours or as directed by MD 01/04/17  Yes Oxford, Orson Ape, FNP  LORazepam (ATIVAN) 1 MG tablet Take 1 tablet (1 mg total) by mouth every 8 (eight) hours as needed for anxiety. Avoid regular use 08/28/17  Yes Panosh, Standley Brooking, MD  montelukast (SINGULAIR) 10 MG tablet TAKE 1 TABLET ONCE DAILY. 10/31/17  Yes Panosh, Standley Brooking, MD  pantoprazole (PROTONIX) 40 MG tablet TAKE (1) TABLET TWICE A DAY BEFORE MEALS. 10/23/17  Yes Panosh, Standley Brooking, MD  potassium chloride (K-DUR,KLOR-CON) 10 MEQ tablet Take 10 mEq by mouth 4 (four) times daily.   Yes [provider]   PROAIR HFA 108 (90 Base) MCG/ACT inhaler USE 2 PUFFS EVERY 6 HOURS AS NEEDED FOR WHEEZING. 12/13/17  Yes Panosh, Standley Brooking, MD  RESTASIS 0.05 % ophthalmic emulsion Place 1 drop into both eyes 2 (two) times daily.  01/01/15  Yes [provider]  Spacer/Aero-Holding Chambers (AEROCHAMBER PLUS) inhaler Use as instructed 05/26/16  Yes Melynda Ripple, MD  vitamin B-12 (CYANOCOBALAMIN) 1000 MCG tablet Take 1,000 mcg by mouth daily.   Yes [provider]     Allergies:    Allergies  Allergen Reactions  . Penicillins Nausea And Vomiting and Other (See Comments)    Has patient had a PCN reaction causing  immediate rash, facial/tongue/throat swelling, SOB or lightheadedness with hypotension: Y Has patient had a PCN reaction causing severe rash involving mucus membranes or skin necrosis: Y Has patient had a PCN reaction that required hospitalization: N Has patient had a PCN reaction occurring within the last 10 years: N If all of the above answers are "NO", then may proceed with Cephalosporin use.   . Aspirin Other (See Comments)    Wheezing Acetaminophen is OK    Social History:   Social History   Socioeconomic History  . Marital status: Single    Spouse name: Not on file  . Number of children: 6  . Years of education: Not on file  . Highest education level: Not on file  Occupational History  . Not on file  Social Needs  . Financial resource strain: Not on file  . Food insecurity    Worry: Not on file    Inability: Not on file  . Transportation needs    Medical: Not on file    Non-medical: Not on file  Tobacco Use  . Smoking status: Never Smoker  . Smokeless tobacco: Never Used  Substance and Sexual Activity  . Alcohol use: No  . Drug use: No  . Sexual activity: Not on file  Lifestyle  . Physical activity    Days per week: Not on file    Minutes per session: Not on file  . Stress: Not on file  Relationships  . Social Herbalist on phone: Not on  file    Gets together: Not on file    Attends religious service: Not on file    Active member of club or organization: Not on file    Attends meetings of clubs or organizations: Not on file    Relationship status: Not on file  . Intimate partner violence    Fear of current or ex partner: Not on file    Emotionally abused: Not on file    Physically abused: Not on file    Forced sexual activity: Not on file  Other Topics Concern  . Not on file  Social History Narrative   2 people living in the home.  Grand daughter.   Up and down through the night      Had 7 children  2 deceased . 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    works 3 days per week currently .   Glasses dentures  Neg tad     Family History:   The patient's family history includes Breast cancer in her brother, mother, paternal aunt, and sister; Cancer in her sister; Colon cancer in her sister; Esophageal cancer in her daughter; Heart attack in her daughter; Hypertension in her brother, father, and mother; Lung cancer in an other family member; Rectal cancer in her brother; Stomach cancer in her brother.    ROS:  Please see the history of present illness.  All other ROS reviewed and negative.     Physical Exam/Data:   Vitals:   11/05/18 1036  BP: (!) 156/62  Pulse: 67  SpO2: 98%  Weight: 180 lb 6.4 oz (81.8 kg)  Height: 5\' 1"  (1.549 m)   @IOBRIEF @ [employer]   11/05/18 1036  Weight: 180 lb 6.4 oz (81.8 kg)   Physical Exam: Blood pressure (!) 156/62, pulse 67, height 5\' 1"  (1.549 m), weight 180 lb 6.4 oz (81.8 kg),  SpO2 98 %.  GEN:  Well nourished, well developed in no acute distress HEENT: Normal NECK: No JVD; No carotid bruits LYMPHATICS: No lymphadenopathy CARDIAC: RRR , no murmurs, rubs, gallops RESPIRATORY:  Clear to auscultation without rales, wheezing or rhonchi  ABDOMEN: Soft, non-tender, non-distended MUSCULOSKELETAL:   1-2 + pitting edema SKIN: Warm and dry NEUROLOGIC:  Alert and oriented x 3    EKG:    Relevant CV Studies:   Laboratory Data:  Chemistry Recent Labs  Lab 11/03/18 1048 11/04/18 0519  NA 131* 134*  K 3.8 3.7  CL 97* 101  CO2 24 26  GLUCOSE 124* 102*  BUN 17 18  CREATININE 1.56* 1.67*  CALCIUM 8.8* 8.7*  GFRNONAA 31* 28*  GFRAA 35* 33*  ANIONGAP 10 7    Recent Labs  Lab 11/02/18 2048  PROT 5.9*  ALBUMIN 3.3*  AST 18  ALT 7  ALKPHOS 40  BILITOT 0.5   Hematology Recent Labs  Lab 11/02/18 2048  WBC 4.0  RBC 3.18*  HGB 9.1*  HCT 28.0*  MCV 88.1  MCH 28.6  MCHC 32.5  RDW 11.9  PLT 201   Cardiac Enzymes Recent Labs  Lab 11/03/18 1048 11/03/18 1252 11/04/18 0820  TROPONINI 0.13* 0.13* 0.12*   No results for input(s): TROPIPOC in the last 168 hours.  BNPNo results for input(s): BNP, PROBNP in the last 168 hours.  DDimer No results for input(s): DDIMER in the last 168 hours.  Radiology/Studies:  No results found.  Assessment and Plan:   1.  Troponin elevation:   Likely was demand ischemia.   Symptoms were atypical but not normal.   Will get a lexiscan myoview       2.  Hypertension:  Still eating lots of salty foods  She and daughter are unclear about her actual med doses.  They will call back later today   3.   Pulmonary HTN:  Repeat echo,  Advised salt restriction .      Signed, Mertie Moores, MD  11/05/2018 10:44 AM

## 2018-11-05 NOTE — Telephone Encounter (Signed)
  Daughter was calling as requested to give information

## 2018-11-06 ENCOUNTER — Ambulatory Visit (INDEPENDENT_AMBULATORY_CARE_PROVIDER_SITE_OTHER): Payer: Medicare Other | Admitting: Internal Medicine

## 2018-11-06 ENCOUNTER — Encounter: Payer: Self-pay | Admitting: Internal Medicine

## 2018-11-06 DIAGNOSIS — N289 Disorder of kidney and ureter, unspecified: Secondary | ICD-10-CM | POA: Diagnosis not present

## 2018-11-06 DIAGNOSIS — R110 Nausea: Secondary | ICD-10-CM

## 2018-11-06 DIAGNOSIS — E871 Hypo-osmolality and hyponatremia: Secondary | ICD-10-CM

## 2018-11-06 DIAGNOSIS — N183 Chronic kidney disease, stage 3 unspecified: Secondary | ICD-10-CM

## 2018-11-06 DIAGNOSIS — D638 Anemia in other chronic diseases classified elsewhere: Secondary | ICD-10-CM | POA: Diagnosis not present

## 2018-11-06 DIAGNOSIS — Z09 Encounter for follow-up examination after completed treatment for conditions other than malignant neoplasm: Secondary | ICD-10-CM

## 2018-11-06 DIAGNOSIS — R7301 Impaired fasting glucose: Secondary | ICD-10-CM

## 2018-11-06 MED ORDER — MONTELUKAST SODIUM 10 MG PO TABS: 10.0000 mg | ORAL_TABLET | Freq: Every day | ORAL | 1 refills | Status: DC

## 2018-11-06 MED ORDER — FLUTICASONE-SALMETEROL 250-50 MCG/DOSE IN AEPB: INHALATION_SPRAY | RESPIRATORY_TRACT | 6 refills | Status: DC

## 2018-11-06 NOTE — Progress Notes (Signed)
Virtual Visit via Video Note  I connected with@ on 11/06/18 at  3:00 PM EDT by a video enabled telemedicine application and verified that I am speaking with the correct person using two identifiers. Location patient: home Location provider:work  office Persons participating in the virtual visit: patient, provider  grandaughter   [place] national recommendations  regarding COVID 19 pandemic   video visit is advised over in office visit for this patient.  Patient aware  of the limitations of evaluation and management by telemedicine and  availability of in person appointments. and agreed to proceed.   HPI: [name] presents for video visit  recent hospt for illness nausea vomiting   Low sodium  She describes  Onset nausea and vomiting after eating  And then to UC and then hospital after 2-3 days for weakness and vomiting   No resp   Sx had neg covid screen .   Her bro passed with covid19  And other had cabg around the same time  .   She was observe d and noted to have  Slight elevated troponin  Felt nonspecific   And her meds were dec to lasix 40 1.2 and hydralazine 1/50 bid and potassium 10 bid   On discharge    Has fu July with cardiology  For tresting  And has fu with  Nephrology soon.  ROS: See pertinent positives and negatives per HPI. No current cp sob syncope  Lots of stress and anxiety  Needs refill of   advair and singular  No uti but ct showed some perinephric edema  Neg U cx  Neg bleeding  Past Medical History:  Diagnosis Date  . Acute superficial venous thrombosis of left lower extremity 03/27/2014   concern about hx and potential extensive on exam tenderness calf and low dose lovenox 40 qd 2-4 weekselevetion and close  fu advised .   [name] Allergy   . Anemia   . Anxiety   . Arthritis    "shoulders" (09/09/2012)  . Asthma 09/08/2012  . Carotid bruit    [place] 2018  low risk 1 - 39%  . Diabetes mellitus without complication ([place])    "Borderline" per pt; being monitored.  no  meds per pt  . Dyspnea 04/27/2014  . GERD (gastroesophageal reflux disease)   . Headache(784.0)    "related to my high blood pressure" (09/09/2012)  . History of DVT (deep vein thrombosis)    "RLE" (09/09/2012) coumadine cant take asa so on plavix   . HTN (hypertension) 09/08/2012  . Hyperlipidemia   . Lupus ([place])    "cured years ago" (09/09/2012)  . Other dysphagia 02/11/2013  . Syncope and collapse 01/21/2018  . Varicose veins   . Varicose veins of leg with complications 3/81/8299    Past Surgical History:  Procedure Laterality Date  . ABDOMINAL HYSTERECTOMY     partial  . BREAST EXCISIONAL BIOPSY Right   . BREAST SURGERY    . CARPAL TUNNEL RELEASE Right 2000's  . CARPAL TUNNEL RELEASE Bilateral 10/21/2013   Procedure: BILATERAL  CARPAL TUNNEL RELEASE;  Surgeon: [name], MD;  Location: [facility];  Service: Orthopedics;  Laterality: Bilateral;  . SHOULDER OPEN ROTATOR CUFF REPAIR Bilateral 2000's  . TRIGGER FINGER RELEASE Right 10/21/2013   Procedure: RELEASE TRIGGER FINGER/A-1 PULLEY RIGHT MIDDLE AND RIGHT RING;  Surgeon: [name], MD;  Location: [facility];  Service: Orthopedics;  Laterality: Right;    Family History  Problem Relation  Age of Onset  . Hypertension Mother   . Breast cancer Mother   . Hypertension Father   . Cancer Sister        Colon and Breast Cancer  . Breast cancer Sister   . Colon cancer Sister        ? 36' s dx - died in 62's  . Hypertension Brother   . Rectal cancer Brother   . Stomach cancer Brother   . Breast cancer Brother   . Heart attack Daughter   . Esophageal cancer Daughter   . Lung cancer Other        both parents   . Breast cancer Paternal Aunt     Social History   Tobacco Use  . Smoking status: Never Smoker  . Smokeless tobacco: Never Used  Substance Use Topics  . Alcohol use: No  . Drug use: No      Current Outpatient Medications:  .  acetaminophen (TYLENOL) 500 MG tablet, Take 1,000 mg  by mouth every 6 (six) hours as needed for moderate pain. Reported on 04/30/2015, Disp: , Rfl:  .  albuterol (PROAIR HFA) 108 (90 Base) MCG/ACT inhaler, USE 2 PUFFS EVERY 6 HOURS AS NEEDED FOR WHEEZING., Disp: 8.5 g, Rfl: 0 .  albuterol (PROVENTIL) (2.5 MG/3ML) 0.083% nebulizer solution, USE 1 AMPULE EVERY 6 HOURS AS NEEDED FOR WHEEZE, Disp: 75 mL, Rfl: 0 .  amLODipine-olmesartan (AZOR) 10-40 MG tablet, Take 1 tablet by mouth daily., Disp: , Rfl:  .  atorvastatin (LIPITOR) 20 MG tablet, TAKE 1 TABLET ONCE DAILY., Disp: 90 tablet, Rfl: 0 .  carvedilol (COREG) 6.25 MG tablet, Take 6.25 mg by mouth daily., Disp: , Rfl:  .  citalopram (CELEXA) 20 MG tablet, TAKE 1 TABLET ONCE DAILY., Disp: 90 tablet, Rfl: 1 .  clopidogrel (PLAVIX) 75 MG tablet, TAKE 1 TABLET ONCE DAILY., Disp: 30 tablet, Rfl: 0 .  diclofenac sodium (VOLTAREN) 1 % GEL, APPLY 4GM TO AFFECTED AREA(S) 4 TIMES A DAY., Disp: 100 g, Rfl: 0 .  doxazosin (CARDURA) 2 MG tablet, TAKE 3 TABLETS AT BEDTIME., Disp: 90 tablet, Rfl: 0 .  ferrous sulfate 325 (65 FE) MG EC tablet, Take 1 tablet (325 mg total) by mouth daily with breakfast., Disp: 30 tablet, Rfl: 3 .  fluorometholone (FML) 0.1 % ophthalmic suspension, Place 1 drop into both eyes 2 (two) times daily. , Disp: , Rfl:  .  Fluticasone-Salmeterol (ADVAIR) 250-50 MCG/DOSE AEPB, USE 1 PUFF TWICE DAILY., Disp: 60 each, Rfl: 6 .  furosemide (LASIX) 40 MG tablet, Take 120 mg by mouth 2 (two) times daily., Disp: , Rfl:  .  hydrALAZINE (APRESOLINE) 100 MG tablet, Take 100 mg by mouth 3 (three) times daily., Disp: , Rfl:  .  lactobacillus acidophilus & bulgar (LACTINEX) chewable tablet, Chew 1 tablet by mouth 3 (three) times daily with meals., Disp: 30 tablet, Rfl: 0 .  lidocaine (LIDODERM) 5 %, Place 1 patch onto the skin daily. Remove & Discard patch within 12 hours or as directed by MD, Disp: 30 patch, Rfl: 0 .  LORazepam (ATIVAN) 1 MG tablet, TAKE 1 TABLET EVERY 8 HOURS AS NEEDED FOR ANXIETY. AVOID  REGULAR USE., Disp: 24 tablet, Rfl: 0 .  montelukast (SINGULAIR) 10 MG tablet, Take 1 tablet (10 mg total) by mouth daily., Disp: 90 tablet, Rfl: 1 .  ondansetron (ZOFRAN ODT) 4 MG disintegrating tablet, Take 1 tablet (4 mg total) by mouth every 8 (eight) hours as needed for nausea or vomiting., Disp: 20  tablet, Rfl: 0 .  pantoprazole (PROTONIX) 40 MG tablet, TAKE (1) TABLET TWICE A DAY BEFORE MEALS., Disp: 180 tablet, Rfl: 0 .  potassium chloride (K-DUR) 10 MEQ tablet, Take 1 tablet (10 mEq total) by mouth daily., Disp: 30 tablet, Rfl: 0 .  RESTASIS 0.05 % ophthalmic emulsion, Place 1 drop into both eyes 2 (two) times daily. , Disp: , Rfl:  .  Spacer/Aero-Holding Chambers (AEROCHAMBER PLUS) inhaler, Use as instructed, Disp: 1 each, Rfl: 2 .  vitamin B-12 (CYANOCOBALAMIN) 1000 MCG tablet, Take 1,000 mcg by mouth daily., Disp: , Rfl:   EXAM: BP Readings from Last 3 Encounters:  11/05/18 (!) 156/62  11/04/18 (!) 138/58  10/30/18 (!) 161/63   Wt Readings from Last 3 Encounters:  11/05/18 180 lb 6.4 oz (81.8 kg)  11/04/18 184 lb 3.2 oz (83.6 kg)  05/13/18 181 lb 6.4 oz (82.3 kg)     VITALS per patient if applicable:  GENERAL: alert, oriented, appears well and in no acute distress looks alert nl speech and nad   HEENT: atraumatic, conjunttiva clear, no obvious abnormalities on inspection of external nose and ears NECK: normal movements of the head and neck LUNGS: on inspection no signs of respiratory distress, breathing rate appears normal, no obvious gross SOB, gasping or wheezing CV: no obvious cyanosis MS: moves all visible extremities without noticeable abnormality  PSYCH/NEURO: pleasant and cooperative, no obvious depression or anxiety, speech and thought processing  intact Lab Results  Component Value Date   WBC 4.0 11/02/2018   HGB 9.1 (L) 11/02/2018   HCT 28.0 (L) 11/02/2018   PLT 201 11/02/2018   GLUCOSE 102 (H) 11/04/2018   CHOL 177 03/25/2018   TRIG 72.0 03/25/2018    HDL 73.60 03/25/2018   LDLCALC 89 03/25/2018   ALT 7 11/02/2018   AST 18 11/02/2018   NA 134 (L) 11/04/2018   K 3.7 11/04/2018   CL 101 11/04/2018   CREATININE 1.67 (H) 11/04/2018   BUN 18 11/04/2018   CO2 26 11/04/2018   TSH 1.31 07/09/2017   INR 0.94 12/22/2009   HGBA1C 5.7 (H) 11/03/2018   MICROALBUR 10.7 (H) 04/25/2016   Wt Readings from Last 3 Encounters:  11/05/18 180 lb 6.4 oz (81.8 kg)  11/04/18 184 lb 3.2 oz (83.6 kg)  05/13/18 181 lb 6.4 oz (82.3 kg)    ASSESSMENT AND PLAN:  Discussed the following assessment and plan:    ICD-10-CM   1. Hyponatremia  A83.4 Basic metabolic panel    CBC with Differential/Platelet    Magnesium  2. Hospital discharge follow-up  H96 Basic metabolic panel    CBC with Differential/Platelet    Magnesium  3. Renal insufficiency creatinine 1.3  Q22.9 Basic metabolic panel    CBC with Differential/Platelet    Magnesium  4. Nausea  N98.9 Basic metabolic panel    CBC with Differential/Platelet    Magnesium  5. Anemia, chronic disease  Q11.9 Basic metabolic panel    CBC with Differential/Platelet    Magnesium  6. CKD (chronic kidney disease) stage 3, GFR 30-59 ml/min (HCC)  N18.3   7. Fasting hyperglycemia  R73.01    Suspect acute Gi illness that   Triggered secondary  Illness  And hyponatremia and  Sx of such .  Aggressive meds were decreased  Adjusted in dosing at this time Counseled.  blood sugars are good    Condolences on losses  .   Would plan lab next week to ensure    Hyponatremia not return  However if  Dr Hollie Salk does  Cbc and bmp then we can not repeat    pulmo status appears stable  Refill Advair and singular  Plan fu     In 3-4 weeks depending on status and labs and specialty care .   Expectant management and discussion of plan and treatment with opportunity to ask questions and all were answered. The patient agreed with the plan and demonstrated an understanding of the instructions.   Advised to call back or seek an  in-person evaluation if worsening  or having  further concerns . In interim   I provided 48 minutes of non-face-to-face time during this encounter.  [name], MD   following is hosp scans and dc information IMPRESSION:  Ct  1. Mild left perinephric edema, can be seen with urinary tract infection. 2. Colonic diverticulosis without diverticulitis. Small hiatal hernia. 3.  Aortic Atherosclerosis (ICD10-I70.0).   Electronically Signed   By: Keith Rake M.D.   On: 11/02/2018 23:32  Admit date: 11/02/2018 Discharge date: 11/04/2018  Admitted From: Home Disposition: Home Recommendations for Outpatient Follow-up:  1. Follow up with PCP in 1-2 weeks 2. Please obtain BMP/CBC in one week 3. Follow-up with Dr. Acie Fredrickson  Home Health none Equipment/Devices: None  Discharge Condition: Stable and improved CODE STATUS: Full code Diet recommendation cardiac Brief/Interim Summary:84 y.o.femalewith medical history significant ofanemia, anxiety, arthritis, asthma, type 2 diabetes, GERD, DVT, hypertension, hyperlipidemia, lupus, CKDstage III-IV, and conditions listed below presenting to the hospital for evaluation ofnausea.Patient states a week ago she had a bologna sandwich and started having vomiting and diarrhea soon after. She then went to an urgent care center 5 days ago and was given a medicine for nausea. States her diarrhea resolved a few days ago and nausea improved. Today after eating soup she started feeling nauseous again but did not vomit. She was later able to eat applesauce and keep it down. Reports having some epigastric discomfort but no abdominal pain otherwise. States she has acid reflux for which she takes a medicine. Denies any fevers or chills. Denies any chest pain at rest or with exertion. Denies any dysuria.  ED Course:Afebrile, not tachycardic.Blood pressure elevated with systolic in the 045W to 098J. No leukocytosis. Hemoglobin 9.1, was 9.5  sevenmonths ago. Sodium 125, chloride 90. Blood glucose 116. BUN 22. Creatinine 1.7, at baseline. Lipase and LFTs normal. UA not suggestive of infection. Urine culture pending. COVID-19 rapid test pending. Troponin 0.09. EKG not suggestive of ACS. CT showing colonic diverticulosis without diverticulitis. Patient received a 500 cc fluid bolus and Zofran in the ED.  Discharge Diagnoses:  Principal Problem:   Nausea Active Problems:   Asthma   CKD (chronic kidney disease) stage 3, GFR 30-59 ml/min (HCC)   Elevated troponin   Hyponatremia  Nausea-patient was admitted to telemetry floor.  Her nausea improved within 24 hours with symptomatic treatment.  She was treated with IV fluids and Zofran.  Was able to tolerate a diet prior to discharge.  Her labs including lipase LFTs and white count were normal.  Abdominal CT showed no acute findings.  Elevated troponin her troponin was elevated at 0.09 on admission and up to 0.13 at the time of discharge.  This was thought to be secondary to her CKD.  She did not have any symptoms of chest pain shortness of breath.  Discussed with cardiology Dr. Marlou Porch will plan discharge home today and follow-up with them as an outpatient.  Continue all her cardiac medications prior to  admission.  Moderate hyponatremia Sodium 125. Likely secondary to decreased p.o. intake and recent vomiting.  This improved with normal saline sodium at 134 on the day of discharge.  Hypertension continue antihypertensives.  I have decreased the dose of hydralazine 25 mg twice a day.  CKD stage III renal function stable creatinine 1.67 on discharge. -Creatinine 1.7, at baseline.  Well-controlled type 2 diabetes Last A1c 5.8 in November 2019.  Asthma -Stable. No bronchospasm. Albuterol PRN.  Estimated body mass index is 34.8 kg/m as calculated from the following:   Height as of this encounter: 5\' 1"  (1.549 m).   Weight as of this encounter: 83.6 kg.

## 2018-11-07 DIAGNOSIS — D631 Anemia in chronic kidney disease: Secondary | ICD-10-CM | POA: Diagnosis not present

## 2018-11-07 DIAGNOSIS — N184 Chronic kidney disease, stage 4 (severe): Secondary | ICD-10-CM | POA: Diagnosis not present

## 2018-11-07 DIAGNOSIS — I129 Hypertensive chronic kidney disease with stage 1 through stage 4 chronic kidney disease, or unspecified chronic kidney disease: Secondary | ICD-10-CM | POA: Diagnosis not present

## 2018-11-07 DIAGNOSIS — E871 Hypo-osmolality and hyponatremia: Secondary | ICD-10-CM | POA: Diagnosis not present

## 2018-11-09 ENCOUNTER — Other Ambulatory Visit: Payer: Self-pay | Admitting: Internal Medicine

## 2018-11-12 ENCOUNTER — Other Ambulatory Visit (INDEPENDENT_AMBULATORY_CARE_PROVIDER_SITE_OTHER): Payer: Medicare Other

## 2018-11-12 ENCOUNTER — Other Ambulatory Visit: Payer: Self-pay

## 2018-11-12 DIAGNOSIS — D638 Anemia in other chronic diseases classified elsewhere: Secondary | ICD-10-CM

## 2018-11-12 DIAGNOSIS — E871 Hypo-osmolality and hyponatremia: Secondary | ICD-10-CM | POA: Diagnosis not present

## 2018-11-12 DIAGNOSIS — N289 Disorder of kidney and ureter, unspecified: Secondary | ICD-10-CM | POA: Diagnosis not present

## 2018-11-12 DIAGNOSIS — R110 Nausea: Secondary | ICD-10-CM

## 2018-11-12 DIAGNOSIS — Z09 Encounter for follow-up examination after completed treatment for conditions other than malignant neoplasm: Secondary | ICD-10-CM

## 2018-11-12 LAB — MAGNESIUM: Magnesium: 2.1 mg/dL (ref 1.5–2.5)

## 2018-11-12 LAB — BASIC METABOLIC PANEL
BUN: 20 mg/dL (ref 6–23)
CO2: 28 mEq/L (ref 19–32)
Calcium: 8.9 mg/dL (ref 8.4–10.5)
Chloride: 93 mEq/L — ABNORMAL LOW (ref 96–112)
Creatinine, Ser: 1.56 mg/dL — ABNORMAL HIGH (ref 0.40–1.20)
GFR: 38.37 mL/min — ABNORMAL LOW (ref >60.00)
Glucose, Bld: 110 mg/dL — ABNORMAL HIGH (ref 70–99)
Potassium: 4 mEq/L (ref 3.5–5.1)
Sodium: 130 mEq/L — ABNORMAL LOW (ref 135–145)

## 2018-11-12 LAB — CBC WITH DIFFERENTIAL/PLATELET
Basophils Absolute: 0 10*3/uL (ref 0.0–0.1)
Basophils Relative: 0.7 % (ref 0.0–3.0)
Eosinophils Absolute: 0.1 10*3/uL (ref 0.0–0.7)
Eosinophils Relative: 1.4 % (ref 0.0–5.0)
HCT: 29.4 % — ABNORMAL LOW (ref 36.0–46.0)
Hemoglobin: 9.7 g/dL — ABNORMAL LOW (ref 12.0–15.0)
Lymphocytes Relative: 25 % (ref 12.0–46.0)
Lymphs Abs: 1 10*3/uL (ref 0.7–4.0)
MCHC: 33.1 g/dL (ref 30.0–36.0)
MCV: 87.7 fl (ref 78.0–100.0)
Monocytes Absolute: 0.4 10*3/uL (ref 0.1–1.0)
Monocytes Relative: 9.2 % (ref 3.0–12.0)
Neutro Abs: 2.6 10*3/uL (ref 1.4–7.7)
Neutrophils Relative %: 63.7 % (ref 43.0–77.0)
Platelets: 221 10*3/uL (ref 150.0–400.0)
RBC: 3.35 Mil/uL — ABNORMAL LOW (ref 3.87–5.11)
RDW: 13 % (ref 11.5–15.5)
WBC: 4.1 10*3/uL (ref 4.0–10.5)

## 2018-11-14 ENCOUNTER — Other Ambulatory Visit: Payer: Self-pay

## 2018-11-14 ENCOUNTER — Ambulatory Visit (HOSPITAL_COMMUNITY): Payer: Medicare Other | Attending: Cardiovascular Disease

## 2018-11-14 ENCOUNTER — Ambulatory Visit (HOSPITAL_BASED_OUTPATIENT_CLINIC_OR_DEPARTMENT_OTHER): Payer: Medicare Other

## 2018-11-14 DIAGNOSIS — R7989 Other specified abnormal findings of blood chemistry: Secondary | ICD-10-CM

## 2018-11-14 DIAGNOSIS — I272 Pulmonary hypertension, unspecified: Secondary | ICD-10-CM | POA: Diagnosis not present

## 2018-11-14 DIAGNOSIS — R778 Other specified abnormalities of plasma proteins: Secondary | ICD-10-CM

## 2018-11-14 DIAGNOSIS — I2510 Atherosclerotic heart disease of native coronary artery without angina pectoris: Secondary | ICD-10-CM | POA: Diagnosis not present

## 2018-11-14 LAB — MYOCARDIAL PERFUSION IMAGING
LV dias vol: 82 mL (ref 46–106)
LV sys vol: 22 mL
Peak HR: 86 {beats}/min
Rest HR: 66 {beats}/min
SDS: 2
SRS: 0
SSS: 2
TID: 0.92

## 2018-11-14 LAB — ECHOCARDIOGRAM COMPLETE
Height: 61 in
Weight: 2880 oz

## 2018-11-14 MED ORDER — TECHNETIUM TC 99M TETROFOSMIN IV KIT
10.7000 | PACK | Freq: Once | INTRAVENOUS | Status: AC | PRN
  Administered 2018-11-14: 10.7 via INTRAVENOUS
  Filled 2018-11-14: qty 11

## 2018-11-14 MED ORDER — TECHNETIUM TC 99M TETROFOSMIN IV KIT
31.5000 | PACK | Freq: Once | INTRAVENOUS | Status: AC | PRN
  Administered 2018-11-14: 31.5 via INTRAVENOUS
  Filled 2018-11-14: qty 32

## 2018-11-14 MED ORDER — REGADENOSON 0.4 MG/5ML IV SOLN
0.4000 mg | Freq: Once | INTRAVENOUS | Status: AC
  Administered 2018-11-14: 0.4 mg via INTRAVENOUS

## 2018-11-22 DIAGNOSIS — E871 Hypo-osmolality and hyponatremia: Secondary | ICD-10-CM | POA: Diagnosis not present

## 2018-11-22 DIAGNOSIS — N184 Chronic kidney disease, stage 4 (severe): Secondary | ICD-10-CM | POA: Diagnosis not present

## 2018-11-25 ENCOUNTER — Other Ambulatory Visit: Payer: Self-pay

## 2018-11-25 ENCOUNTER — Ambulatory Visit
Admission: RE | Admit: 2018-11-25 | Discharge: 2018-11-25 | Disposition: A | Discharge status: Home / Self Care | Payer: Medicare Other | Source: Ambulatory Visit | Attending: Internal Medicine | Admitting: Internal Medicine

## 2018-11-25 DIAGNOSIS — Z1231 Encounter for screening mammogram for malignant neoplasm of breast: Secondary | ICD-10-CM | POA: Diagnosis not present

## 2018-11-25 DIAGNOSIS — Z9289 Personal history of other medical treatment: Secondary | ICD-10-CM

## 2018-11-25 DIAGNOSIS — N184 Chronic kidney disease, stage 4 (severe): Secondary | ICD-10-CM | POA: Diagnosis not present

## 2018-11-26 ENCOUNTER — Other Ambulatory Visit: Payer: Self-pay | Admitting: Internal Medicine

## 2018-11-26 ENCOUNTER — Other Ambulatory Visit: Payer: Self-pay

## 2018-11-28 ENCOUNTER — Other Ambulatory Visit: Payer: Self-pay | Admitting: Internal Medicine

## 2018-11-28 DIAGNOSIS — I129 Hypertensive chronic kidney disease with stage 1 through stage 4 chronic kidney disease, or unspecified chronic kidney disease: Secondary | ICD-10-CM | POA: Diagnosis not present

## 2018-11-28 DIAGNOSIS — M109 Gout, unspecified: Secondary | ICD-10-CM | POA: Diagnosis not present

## 2018-11-28 DIAGNOSIS — E871 Hypo-osmolality and hyponatremia: Secondary | ICD-10-CM | POA: Diagnosis not present

## 2018-11-28 DIAGNOSIS — D631 Anemia in chronic kidney disease: Secondary | ICD-10-CM | POA: Diagnosis not present

## 2018-11-28 DIAGNOSIS — N184 Chronic kidney disease, stage 4 (severe): Secondary | ICD-10-CM | POA: Diagnosis not present

## 2018-11-29 ENCOUNTER — Other Ambulatory Visit: Payer: Self-pay | Admitting: Internal Medicine

## 2018-11-29 ENCOUNTER — Other Ambulatory Visit: Payer: Self-pay

## 2018-11-29 NOTE — Telephone Encounter (Signed)
Last visit 11/06/2018

## 2018-11-29 NOTE — Patient Outreach (Signed)
[facility]) Care Management  11/29/2018  Davonne Baby [facility] 11-Sep-1933 [id number]   Medication Adherence call to Mrs. [place] Compliant Voice message left with a call back number. Mrs. Battey is showing past due on Amlodepine/Olmesartan 10/40 mg under [place].   [place] Management Direct Dial [phone]  Fax [phone] [email or web address]

## 2018-11-29 NOTE — Telephone Encounter (Signed)
Sent in electronically .  

## 2018-12-05 ENCOUNTER — Other Ambulatory Visit: Payer: Self-pay | Admitting: Internal Medicine

## 2018-12-06 ENCOUNTER — Other Ambulatory Visit: Payer: Self-pay | Admitting: Internal Medicine

## 2018-12-19 ENCOUNTER — Other Ambulatory Visit: Payer: Self-pay

## 2018-12-19 NOTE — Patient Outreach (Signed)
[facility]) Care Management  12/19/2018  Orlena Othman [facility] [birth year] [id number]   Medication Adherence call to Mrs. [place] Compliant Voice message left with a call back number. Mrs. Varon is showing past due on Amlodipine/Olmesartan10/40 mg under [place].   [place] Management Direct Dial [phone]  Fax [phone] [email or web address]

## 2018-12-23 ENCOUNTER — Other Ambulatory Visit: Payer: Self-pay

## 2018-12-23 DIAGNOSIS — I83899 Varicose veins of unspecified lower extremities with other complications: Secondary | ICD-10-CM

## 2018-12-23 DIAGNOSIS — I872 Venous insufficiency (chronic) (peripheral): Secondary | ICD-10-CM

## 2018-12-24 ENCOUNTER — Telehealth (HOSPITAL_COMMUNITY): Payer: Self-pay | Admitting: Rehabilitation

## 2018-12-24 NOTE — Telephone Encounter (Signed)

## 2018-12-25 ENCOUNTER — Other Ambulatory Visit: Payer: Self-pay

## 2018-12-25 ENCOUNTER — Ambulatory Visit (INDEPENDENT_AMBULATORY_CARE_PROVIDER_SITE_OTHER): Payer: Medicare Other | Admitting: Vascular Surgery

## 2018-12-25 ENCOUNTER — Ambulatory Visit (HOSPITAL_COMMUNITY)
Admission: RE | Admit: 2018-12-25 | Discharge: 2018-12-25 | Disposition: A | Discharge status: Home / Self Care | Payer: Medicare Other | Source: Ambulatory Visit | Attending: Family | Admitting: Family

## 2018-12-25 ENCOUNTER — Encounter: Payer: Self-pay | Admitting: Vascular Surgery

## 2018-12-25 VITALS — BP 113/58 | HR 70 | Temp 98.3°F | Resp 18 | Ht 60.5 in | Wt 180.0 lb

## 2018-12-25 DIAGNOSIS — I83813 Varicose veins of bilateral lower extremities with pain: Secondary | ICD-10-CM

## 2018-12-25 DIAGNOSIS — I83899 Varicose veins of unspecified lower extremities with other complications: Secondary | ICD-10-CM | POA: Diagnosis not present

## 2018-12-25 DIAGNOSIS — I872 Venous insufficiency (chronic) (peripheral): Secondary | ICD-10-CM | POA: Diagnosis not present

## 2018-12-25 NOTE — Progress Notes (Signed)
Referring Physician: Dr Hollie Salk  Patient name: [name] MRN: [id number] DOB: [birth year] Sex: female  REASON FOR CONSULT: Chronic bilateral leg swelling  HPI: [name] is a 85 y.o. female, who has had leg swelling for the last several years.  She states usually her symptoms are controlled with diuretic.  However they have gotten worse recently.  She has had a fairly extensive work-up involving nephrology as well as an echocardiogram.  These did not really explain her lower extremity leg swelling symptoms.  She has worn some knee-high compression stockings in the past.  She states these helped a little bit but did not completely alleviate her symptoms.  She does not really notice a huge difference when she elevates her legs although she does not do this very often.  She has no prior history of DVT.  She denies family history of varicose veins.  Other medical problems include anemia, asthma, borderline diabetes, hypertension, hyperlipidemia all of which are currently stable.  She is on aspirin Plavix and Lasix.  She does state that her left leg has more aches and pains and swelling than the right.  Past Medical History:  Diagnosis Date  . Acute superficial venous thrombosis of left lower extremity 03/27/2014   concern about hx and potential extensive on exam tenderness calf and low dose lovenox 40 qd 2-4 weekselevetion and close  fu advised .   [name] Allergy   . Anemia   . Anxiety   . Arthritis    "shoulders" (09/09/2012)  . Asthma 09/08/2012  . Carotid bruit    [place] 2018  low risk 1 - 39%  . Diabetes mellitus without complication ([place])    "Borderline" per pt; being monitored.  no meds per pt  . Dyspnea 04/27/2014  . GERD (gastroesophageal reflux disease)   . Headache(784.0)    "related to my high blood pressure" (09/09/2012)  . History of DVT (deep vein thrombosis)    "RLE" (09/09/2012) coumadine cant take asa so on plavix   . HTN (hypertension) 09/08/2012  . Hyperlipidemia   .  Lupus ([place])    "cured years ago" (09/09/2012)  . Other dysphagia 02/11/2013  . Syncope and collapse 01/21/2018  . Varicose veins   . Varicose veins of leg with complications [id number]   Past Surgical History:  Procedure Laterality Date  . ABDOMINAL HYSTERECTOMY     partial  . BREAST EXCISIONAL BIOPSY Right   . BREAST SURGERY    . CARPAL TUNNEL RELEASE Right 2000's  . CARPAL TUNNEL RELEASE Bilateral 10/21/2013   Procedure: BILATERAL  CARPAL TUNNEL RELEASE;  Surgeon: [name], MD;  Location: [facility];  Service: Orthopedics;  Laterality: Bilateral;  . SHOULDER OPEN ROTATOR CUFF REPAIR Bilateral 2000's  . TRIGGER FINGER RELEASE Right 10/21/2013   Procedure: RELEASE TRIGGER FINGER/A-1 PULLEY RIGHT MIDDLE AND RIGHT RING;  Surgeon: [name], MD;  Location: [facility];  Service: Orthopedics;  Laterality: Right;    Family History  Problem Relation Age of Onset  . Hypertension Mother   . Breast cancer Mother   . Hypertension Father   . Cancer Sister        Colon and Breast Cancer  . Breast cancer Sister   . Colon cancer Sister        ? 7' s dx - died in 44's  . Hypertension Brother   . Rectal cancer Brother   . Stomach cancer Brother   . Breast cancer Brother   .  Heart attack Daughter   . Esophageal cancer Daughter   . Lung cancer Other        both parents   . Breast cancer Paternal Aunt     SOCIAL HISTORY: Social History   Socioeconomic History  . Marital status: Single    Spouse name: Not on file  . Number of children: 6  . Years of education: Not on file  . Highest education level: Not on file  Occupational History  . Not on file  Social Needs  . Financial resource strain: Not on file  . Food insecurity    Worry: Not on file    Inability: Not on file  . Transportation needs    Medical: Not on file    Non-medical: Not on file  Tobacco Use  . Smoking status: Never Smoker  . Smokeless tobacco: Never Used  Substance and Sexual  Activity  . Alcohol use: No  . Drug use: No  . Sexual activity: Not on file  Lifestyle  . Physical activity    Days per week: Not on file    Minutes per session: Not on file  . Stress: Not on file  Relationships  . Social Herbalist on phone: Not on file    Gets together: Not on file    Attends religious service: Not on file    Active member of club or organization: Not on file    Attends meetings of clubs or organizations: Not on file    Relationship status: Not on file  . Intimate partner violence    Fear of current or ex partner: Not on file    Emotionally abused: Not on file    Physically abused: Not on file    Forced sexual activity: Not on file  Other Topics Concern  . Not on file  Social History Narrative   2 people living in the home.  Grand daughter.   Up and down through the night      Had 7 children  2 deceased . 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    works 3 days per week currently .   Glasses dentures  Neg tad     Allergies  Allergen Reactions  . Penicillins Nausea And Vomiting and Other (See Comments)    Has patient had a PCN reaction causing immediate rash, facial/tongue/throat swelling, SOB or lightheadedness with hypotension: Y Has patient had a PCN reaction causing severe rash involving mucus membranes or skin necrosis: Y Has patient had a PCN reaction that required hospitalization: N Has patient had a PCN reaction occurring within the last 10 years: N If all of the above answers are "NO", then may proceed with Cephalosporin use.   . Aspirin Other (See Comments)    Wheezing Acetaminophen is OK    Current Outpatient Medications  Medication Sig Dispense Refill  . acetaminophen (TYLENOL) 500 MG tablet Take 1,000 mg by mouth every 6 (six) hours as needed for moderate pain. Reported on 04/30/2015    . albuterol (PROVENTIL) (2.5 MG/3ML) 0.083% nebulizer solution  USE 1 AMPULE EVERY 6 HOURS AS NEEDED FOR WHEEZE 75 mL 0  . albuterol (VENTOLIN HFA) 108 (90 Base) MCG/ACT inhaler USE 2 PUFFS EVERY 6 HOURS AS NEEDED FOR WHEEZING. 8.5 g 0  . amLODipine-olmesartan (AZOR) 10-40 MG tablet Take 1 tablet by mouth daily.    [name] atorvastatin (LIPITOR) 20  MG tablet TAKE 1 TABLET ONCE DAILY. 90 tablet 0  . carvedilol (COREG) 6.25 MG tablet Take 6.25 mg by mouth daily.    . citalopram (CELEXA) 20 MG tablet TAKE 1 TABLET ONCE DAILY. 30 tablet 0  . clopidogrel (PLAVIX) 75 MG tablet TAKE 1 TABLET ONCE DAILY. 30 tablet 0  . diclofenac sodium (VOLTAREN) 1 % GEL APPLY 4GM TO AFFECTED AREA(S) 4 TIMES A DAY. 100 g 0  . doxazosin (CARDURA) 2 MG tablet TAKE 3 TABLETS AT BEDTIME. 90 tablet 0  . ferrous sulfate 325 (65 FE) MG EC tablet Take 1 tablet (325 mg total) by mouth daily with breakfast. 30 tablet 3  . fluorometholone (FML) 0.1 % ophthalmic suspension Place 1 drop into both eyes 2 (two) times daily.     . Fluticasone-Salmeterol (ADVAIR) 250-50 MCG/DOSE AEPB USE 1 PUFF TWICE DAILY. 60 each 6  . furosemide (LASIX) 40 MG tablet Take 120 mg by mouth 2 (two) times daily.    . hydrALAZINE (APRESOLINE) 100 MG tablet Take 100 mg by mouth 3 (three) times daily.    [name] lactobacillus acidophilus & bulgar (LACTINEX) chewable tablet Chew 1 tablet by mouth 3 (three) times daily with meals. 30 tablet 0  . lidocaine (LIDODERM) 5 % Place 1 patch onto the skin daily. Remove & Discard patch within 12 hours or as directed by MD 30 patch 0  . LORazepam (ATIVAN) 1 MG tablet TAKE 1 TABLET EVERY 8 HOURS AS NEEDED FOR ANXIETY. AVOID REGULAR USE. 24 tablet 0  . montelukast (SINGULAIR) 10 MG tablet Take 1 tablet (10 mg total) by mouth daily. 90 tablet 1  . ondansetron (ZOFRAN ODT) 4 MG disintegrating tablet Take 1 tablet (4 mg total) by mouth every 8 (eight) hours as needed for nausea or vomiting. 20 tablet 0  . pantoprazole (PROTONIX) 40 MG tablet TAKE (1) TABLET TWICE A DAY BEFORE MEALS. 180 tablet 0  .  potassium chloride (K-DUR) 10 MEQ tablet TAKE 4 TABLETS DAILY. 120 tablet 0  . RESTASIS 0.05 % ophthalmic emulsion Place 1 drop into both eyes 2 (two) times daily.     [name] Spacer/Aero-Holding Chambers (AEROCHAMBER PLUS) inhaler Use as instructed 1 each 2  . vitamin B-12 (CYANOCOBALAMIN) 1000 MCG tablet Take 1,000 mcg by mouth daily.     No current facility-administered medications for this visit.     ROS:   General:  No weight loss, Fever, chills  HEENT: No recent headaches, no nasal bleeding, no visual changes, no sore throat  Neurologic: No dizziness, blackouts, seizures. No recent symptoms of stroke or mini- stroke. No recent episodes of slurred speech, or temporary blindness.  Cardiac: No recent episodes of chest pain/pressure, no shortness of breath at rest.  No shortness of breath with exertion.  Denies history of atrial fibrillation or irregular heartbeat  Vascular: No history of rest pain in feet.  No history of claudication.  No history of non-healing ulcer, No history of DVT   Pulmonary: No home oxygen, no productive cough, no hemoptysis,  No asthma or wheezing  Musculoskeletal:  [X]  Arthritis, [ ]  Low back pain,  [X]  Joint pain  Hematologic:No history of hypercoagulable state.  No history of easy bleeding.  No history of anemia  Gastrointestinal: No hematochezia or melena,  No gastroesophageal reflux, no trouble swallowing  Urinary: [ ]  chronic Kidney disease, [ ]  on HD - [ ]  MWF or [ ]  TTHS, [ ]  Burning with urination, [ ]  Frequent urination, [ ]  Difficulty urinating;  Skin: No rashes  Psychological: No history of anxiety,  No history of depression   Physical Examination  Vitals:   12/25/18 1300  BP: (!) 113/58  Pulse: 70  Resp: 18  Temp: 98.3 F (36.8 C)  TempSrc: Temporal  SpO2: 100%  Weight: 180 lb (81.6 kg)  Height: 5' 0.5" (1.537 m)    Body mass index is 34.58 kg/m.  General:  Alert and oriented, no acute distress HEENT: Normal Cardiac: Regular  Rate and Rhythm  Skin: No rash Extremity Pulses:  2+ radial, brachial, femoral, dorsalis pedis pulses bilaterally Musculoskeletal: No deformity trace pretibial and pedal bilaterally edema  Neurologic: Upper and lower extremity motor 5/5 and symmetric  DATA:  Recent serum creatinine November 12 2018  1.6  Echocardiogram November 18, 2018 normal essentially  Patient had a venous duplex today for reflux.  Left greater saphenous vein had diffuse reflux.  Vein diameter was 4 to 5 mm.  No reflux was noted in the right greater saphenous vein.   ASSESSMENT: Symptomatic varicose veins with pain and swelling no reflux noted on the right side.  However she does have reflux on the left which is her more symptomatic leg.   PLAN: Patient was given a prescription today for thigh-high and lower extremity compression stockings 20 to 30 mmHg.  She will follow-up in 3 months time to see if she gets symptomatic relief from this.  Otherwise we will consider laser ablation of the left greater saphenous vein.  If she continues to have symptoms on the right leg I would consider repeating the duplex on the right side to see if she has reflux on that side as well.   Ruta Hinds, MD [facility] Office: [phone] Pager: [phone]

## 2018-12-27 ENCOUNTER — Other Ambulatory Visit: Payer: Self-pay | Admitting: Internal Medicine

## 2019-01-03 ENCOUNTER — Other Ambulatory Visit: Payer: Self-pay | Admitting: Internal Medicine

## 2019-01-07 NOTE — Progress Notes (Signed)
Chief Complaint  Patient presents with  . Follow-up    medication followup   . Neck Pain    Pt states that her neck has been cramping and that sometimes she has numbeness in her face from it     HPI: [name] 85 y.o. come in for Chronic disease management     Left  Post neck sx feels like  spasm and to tmj area sometimes numb>  Not related to activity assoc sx    No trauma   HT checking readings.  bp dec on standing but no falling syncope.  Has fu cards   And renal.  On high dose lasix   Not weighting legs swell r more than left  Has seen  Vascular poss use of  intervention but not felt from heart of kidneys  No bleeding    2 brothers passed from [place]  Last on august 5th  Asks about  If need colon  Pos fam hx no obv bleeding  Last was ?2015  Pulmonary stable   May need new dmv form before end of year in December   ROS: See pertinent positives and negatives per HPI. To get eye exam in near future   Past Medical History:  Diagnosis Date  . Acute superficial venous thrombosis of left lower extremity 03/27/2014   concern about hx and potential extensive on exam tenderness calf and low dose lovenox 40 qd 2-4 weekselevetion and close  fu advised .   [name] Allergy   . Anemia   . Anxiety   . Arthritis    "shoulders" (09/09/2012)  . Asthma 09/08/2012  . Carotid bruit    [place] 2018  low risk 1 - 39%  . Diabetes mellitus without complication ([place])    "Borderline" per pt; being monitored.  no meds per pt  . Dyspnea 04/27/2014  . GERD (gastroesophageal reflux disease)   . Headache(784.0)    "related to my high blood pressure" (09/09/2012)  . History of DVT (deep vein thrombosis)    "RLE" (09/09/2012) coumadine cant take asa so on plavix   . HTN (hypertension) 09/08/2012  . Hyperlipidemia   . Lupus ([place])    "cured years ago" (09/09/2012)  . Other dysphagia 02/11/2013  . Syncope and collapse 01/21/2018  . Varicose veins   . Varicose veins of leg with complications  [id number]    Family History  Problem Relation Age of Onset  . Hypertension Mother   . Breast cancer Mother   . Hypertension Father   . Cancer Sister        Colon and Breast Cancer  . Breast cancer Sister   . Colon cancer Sister        ? 6' s dx - died in 34's  . Hypertension Brother   . Rectal cancer Brother   . Stomach cancer Brother   . Breast cancer Brother   . Heart attack Daughter   . Esophageal cancer Daughter   . Lung cancer Other        both parents   . Breast cancer Paternal Aunt     Social History   Socioeconomic History  . Marital status: Single    Spouse name: Not on file  . Number of children: 6  . Years of education: Not on file  . Highest education level: Not on file  Occupational History  . Not on file  Social Needs  . Financial resource strain: Not on file  . Food insecurity  Worry: Not on file    Inability: Not on file  . Transportation needs    Medical: Not on file    Non-medical: Not on file  Tobacco Use  . Smoking status: Never Smoker  . Smokeless tobacco: Never Used  Substance and Sexual Activity  . Alcohol use: No  . Drug use: No  . Sexual activity: Not on file  Lifestyle  . Physical activity    Days per week: Not on file    Minutes per session: Not on file  . Stress: Not on file  Relationships  . Social Herbalist on phone: Not on file    Gets together: Not on file    Attends religious service: Not on file    Active member of club or organization: Not on file    Attends meetings of clubs or organizations: Not on file    Relationship status: Not on file  Other Topics Concern  . Not on file  Social History Narrative   2 people living in the home.  Grand daughter.   Up and down through the night      Had 7 children  2 deceased . 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    works 3 days per week currently .   Glasses dentures  Neg tad      Outpatient Medications Prior to Visit  Medication Sig Dispense Refill  . acetaminophen (TYLENOL) 500 MG tablet Take 1,000 mg by mouth every 6 (six) hours as needed for moderate pain. Reported on 04/30/2015    . albuterol (PROVENTIL) (2.5 MG/3ML) 0.083% nebulizer solution USE 1 AMPULE EVERY 6 HOURS AS NEEDED FOR WHEEZE 75 mL 0  . albuterol (VENTOLIN HFA) 108 (90 Base) MCG/ACT inhaler USE 2 PUFFS EVERY 6 HOURS AS NEEDED FOR WHEEZING. 8.5 g 0  . amLODipine-olmesartan (AZOR) 10-40 MG tablet Take 1 tablet by mouth daily.    [name] atorvastatin (LIPITOR) 20 MG tablet TAKE 1 TABLET ONCE DAILY. 90 tablet 0  . carvedilol (COREG) 6.25 MG tablet Take 6.25 mg by mouth daily.    . citalopram (CELEXA) 20 MG tablet TAKE 1 TABLET ONCE DAILY. 30 tablet 0  . clopidogrel (PLAVIX) 75 MG tablet TAKE 1 TABLET ONCE DAILY. 30 tablet 0  . diclofenac sodium (VOLTAREN) 1 % GEL APPLY 4GM TO AFFECTED AREA(S) 4 TIMES A DAY. 100 g 0  . doxazosin (CARDURA) 2 MG tablet TAKE 3 TABLETS AT BEDTIME. 90 tablet 0  . ferrous sulfate 325 (65 FE) MG EC tablet Take 1 tablet (325 mg total) by mouth daily with breakfast. 30 tablet 3  . fluorometholone (FML) 0.1 % ophthalmic suspension Place 1 drop into both eyes 2 (two) times daily.     . Fluticasone-Salmeterol (ADVAIR) 250-50 MCG/DOSE AEPB USE 1 PUFF TWICE DAILY. 60 each 6  . furosemide (LASIX) 40 MG tablet Take 120 mg by mouth 2 (two) times daily.    . hydrALAZINE (APRESOLINE) 100 MG tablet Take 100 mg by mouth 3 (three) times daily.    [name] lactobacillus acidophilus & bulgar (LACTINEX) chewable tablet Chew 1 tablet by mouth 3 (three) times daily with meals. 30 tablet 0  . lidocaine (LIDODERM) 5 % Place 1 patch onto the skin daily. Remove & Discard patch within 12 hours or as directed by MD 30 patch 0  . LORazepam (ATIVAN) 1 MG tablet TAKE 1 TABLET EVERY 8  HOURS AS NEEDED FOR ANXIETY. AVOID REGULAR USE. 24 tablet 0  . montelukast (SINGULAIR) 10 MG tablet Take 1 tablet (10 mg total) by mouth  daily. 90 tablet 1  . ondansetron (ZOFRAN ODT) 4 MG disintegrating tablet Take 1 tablet (4 mg total) by mouth every 8 (eight) hours as needed for nausea or vomiting. 20 tablet 0  . pantoprazole (PROTONIX) 40 MG tablet TAKE (1) TABLET TWICE A DAY BEFORE MEALS. 180 tablet 0  . potassium chloride (K-DUR) 10 MEQ tablet TAKE 4 TABLETS DAILY. 120 tablet 0  . RESTASIS 0.05 % ophthalmic emulsion Place 1 drop into both eyes 2 (two) times daily.     [name] Spacer/Aero-Holding Chambers (AEROCHAMBER PLUS) inhaler Use as instructed 1 each 2  . vitamin B-12 (CYANOCOBALAMIN) 1000 MCG tablet Take 1,000 mcg by mouth daily.     No facility-administered medications prior to visit.      EXAM:  BP 134/60 (BP Location: Left Arm)   Pulse 89   Temp 98.7 F (37.1 C) (Temporal)   Wt 175 lb 9.6 oz (79.7 kg)   SpO2 95%   BMI 33.73 kg/m   Body mass index is 33.73 kg/m.  GENERAL: vitals reviewed and listed above, alert, oriented, appears well hydrated and in no acute distress HEENT: atraumatic, conjunctiva  clear, no obvious abnormalities on inspection of external nose and ears masked   NECK: no obvious masses on inspection palpation   Tight left trap no mass  tmj is cleare and no focal neuro sx  LUNGS: clear to auscultation bilaterally, no wheezes, rales or rhonchi, good air movement CV: HRRR, no clubbing cyanosis or  Legs  32=3 + chronic  Edema  Bilateral  MS: moves all extremities without noticeable focal  abnormality PSYCH: pleasant and cooperative, no obvious depression or anxiety Lab Results  Component Value Date   WBC 4.1 11/12/2018   HGB 9.7 (L) 11/12/2018   HCT 29.4 (L) 11/12/2018   PLT 221.0 11/12/2018   GLUCOSE 110 (H) 11/12/2018   CHOL 177 03/25/2018   TRIG 72.0 03/25/2018   HDL 73.60 03/25/2018   LDLCALC 89 03/25/2018   ALT 7 11/02/2018   AST 18 11/02/2018   NA 130 (L) 11/12/2018   K 4.0 11/12/2018   CL 93 (L) 11/12/2018   CREATININE 1.56 (H) 11/12/2018   BUN 20 11/12/2018   CO2 28  11/12/2018   TSH 1.31 07/09/2017   INR 0.94 12/22/2009   HGBA1C 5.7 (H) 11/03/2018   MICROALBUR 10.7 (H) 04/25/2016   BP Readings from Last 3 Encounters:  01/08/19 134/60  12/25/18 (!) 113/58  11/05/18 (!) 156/62   Wt Readings from Last 3 Encounters:  01/08/19 175 lb 9.6 oz (79.7 kg)  12/25/18 180 lb (81.6 kg)  11/14/18 180 lb (81.6 kg)    ASSESSMENT AND PLAN:  Discussed the following assessment and plan:  Hypertensive heart disease with hypertensive chronic kidney disease, unspecified CKD stage, unspecified whether heart failure present - Plan: Basic metabolic panel, CBC with Differential/Platelet, Magnesium  Medication management - Plan: Basic metabolic panel, CBC with Differential/Platelet, Magnesium  Renal insufficiency creatinine 1.3 - Plan: Basic metabolic panel, CBC with Differential/Platelet, Magnesium  Anemia of chronic disease - Plan: Basic metabolic panel, CBC with Differential/Platelet, Magnesium  Neck pain - Plan: Basic metabolic panel, CBC with Differential/Platelet, Magnesium  Not sure cause of neck tightness suspect MS cause  And no other alarm sx  riskj of pt is more than benefit so self care topical for now and follow  Labs today  Will send info to specialist.  On a very high dose of furosemide   Weight every day   Suggest  Cards and renal be involved with managing her lasix dose  Over time  Advise she do dialy weights and notify team if big jump in weight  Will have license form .  But she is aware and cautious of circumstance safer driving  rov 3-4 mos -Patient advised to return or notify health care team  if  new concerns arise.  Patient Instructions   think  Your neck sx may be from muscle spasm and pos sibley arthritis. Suggest  Topical  Therapy  Stretching  otc     Patches voltaren maybe if needed .  consdier physical therapy but not in the covid shut down  If getting worse let us know .   Ask GI about  Whether need colonscopy usually stop at  your age but you have a family hx of  Cancer .    Do daily weights and record  And if have  increase weight over days  4+ pounds contact the medical team in case it is fluid retention.   Plan Follow  Up depending on  Lab results and how doing  Or with dmv form  .    Standley Brooking. Kashari Chalmers M.D.

## 2019-01-08 ENCOUNTER — Other Ambulatory Visit: Payer: Self-pay

## 2019-01-08 ENCOUNTER — Encounter: Payer: Self-pay | Admitting: Internal Medicine

## 2019-01-08 ENCOUNTER — Ambulatory Visit (INDEPENDENT_AMBULATORY_CARE_PROVIDER_SITE_OTHER): Payer: Medicare Other | Admitting: Internal Medicine

## 2019-01-08 VITALS — BP 134/60 | HR 89 | Temp 98.7°F | Wt 175.6 lb

## 2019-01-08 DIAGNOSIS — I1310 Hypertensive heart and chronic kidney disease without heart failure, with stage 1 through stage 4 chronic kidney disease, or unspecified chronic kidney disease: Secondary | ICD-10-CM | POA: Diagnosis not present

## 2019-01-08 DIAGNOSIS — M542 Cervicalgia: Secondary | ICD-10-CM

## 2019-01-08 DIAGNOSIS — Z79899 Other long term (current) drug therapy: Secondary | ICD-10-CM | POA: Diagnosis not present

## 2019-01-08 DIAGNOSIS — D638 Anemia in other chronic diseases classified elsewhere: Secondary | ICD-10-CM

## 2019-01-08 DIAGNOSIS — N289 Disorder of kidney and ureter, unspecified: Secondary | ICD-10-CM

## 2019-01-08 NOTE — Patient Instructions (Signed)
think  Your neck sx may be from muscle spasm and pos sibley arthritis. Suggest  Topical  Therapy  Stretching  otc     Patches voltaren maybe if needed .  consdier physical therapy but not in the covid shut down  If getting worse let us know .   Ask GI about  Whether need colonscopy usually stop at your age but you have a family hx of  Cancer .    Do daily weights and record  And if have  increase weight over days  4+ pounds contact the medical team in case it is fluid retention.   Plan Follow  Up depending on  Lab results and how doing  Or with dmv form  .

## 2019-01-09 LAB — BASIC METABOLIC PANEL
BUN: 54 mg/dL — ABNORMAL HIGH (ref 6–23)
CO2: 28 mEq/L (ref 19–32)
Calcium: 9.4 mg/dL (ref 8.4–10.5)
Chloride: 103 mEq/L (ref 96–112)
Creatinine, Ser: 3.06 mg/dL — ABNORMAL HIGH (ref 0.40–1.20)
GFR: 17.63 mL/min — ABNORMAL LOW (ref >60.00)
Glucose, Bld: 107 mg/dL — ABNORMAL HIGH (ref 70–99)
Potassium: 4 mEq/L (ref 3.5–5.1)
Sodium: 143 mEq/L (ref 135–145)

## 2019-01-09 LAB — CBC WITH DIFFERENTIAL/PLATELET
Basophils Absolute: 0.1 10*3/uL (ref 0.0–0.1)
Basophils Relative: 1 % (ref 0.0–3.0)
Eosinophils Absolute: 0.1 10*3/uL (ref 0.0–0.7)
Eosinophils Relative: 1.9 % (ref 0.0–5.0)
HCT: 31.3 % — ABNORMAL LOW (ref 36.0–46.0)
Hemoglobin: 10.3 g/dL — ABNORMAL LOW (ref 12.0–15.0)
Lymphocytes Relative: 27.4 % (ref 12.0–46.0)
Lymphs Abs: 1.5 10*3/uL (ref 0.7–4.0)
MCHC: 32.9 g/dL (ref 30.0–36.0)
MCV: 87.5 fl (ref 78.0–100.0)
Monocytes Absolute: 0.4 10*3/uL (ref 0.1–1.0)
Monocytes Relative: 6.5 % (ref 3.0–12.0)
Neutro Abs: 3.5 10*3/uL (ref 1.4–7.7)
Neutrophils Relative %: 63.2 % (ref 43.0–77.0)
Platelets: 178 10*3/uL (ref 150.0–400.0)
RBC: 3.58 Mil/uL — ABNORMAL LOW (ref 3.87–5.11)
RDW: 13.3 % (ref 11.5–15.5)
WBC: 5.6 10*3/uL (ref 4.0–10.5)

## 2019-01-09 LAB — MAGNESIUM: Magnesium: 2.3 mg/dL (ref 1.5–2.5)

## 2019-01-16 ENCOUNTER — Other Ambulatory Visit: Payer: Self-pay

## 2019-01-16 DIAGNOSIS — N289 Disorder of kidney and ureter, unspecified: Secondary | ICD-10-CM

## 2019-01-17 ENCOUNTER — Other Ambulatory Visit: Payer: Self-pay

## 2019-01-17 ENCOUNTER — Other Ambulatory Visit (INDEPENDENT_AMBULATORY_CARE_PROVIDER_SITE_OTHER): Payer: Medicare Other

## 2019-01-17 DIAGNOSIS — N289 Disorder of kidney and ureter, unspecified: Secondary | ICD-10-CM

## 2019-01-17 DIAGNOSIS — Z23 Encounter for immunization: Secondary | ICD-10-CM

## 2019-01-17 DIAGNOSIS — D638 Anemia in other chronic diseases classified elsewhere: Secondary | ICD-10-CM | POA: Diagnosis not present

## 2019-01-17 DIAGNOSIS — R7303 Prediabetes: Secondary | ICD-10-CM | POA: Diagnosis not present

## 2019-01-17 DIAGNOSIS — N183 Chronic kidney disease, stage 3 unspecified: Secondary | ICD-10-CM

## 2019-01-17 DIAGNOSIS — I1310 Hypertensive heart and chronic kidney disease without heart failure, with stage 1 through stage 4 chronic kidney disease, or unspecified chronic kidney disease: Secondary | ICD-10-CM

## 2019-01-17 LAB — BASIC METABOLIC PANEL
BUN: 52 mg/dL — ABNORMAL HIGH (ref 6–23)
CO2: 30 mEq/L (ref 19–32)
Calcium: 9 mg/dL (ref 8.4–10.5)
Chloride: 102 mEq/L (ref 96–112)
Creatinine, Ser: 2.97 mg/dL — ABNORMAL HIGH (ref 0.40–1.20)
GFR: 18.24 mL/min — ABNORMAL LOW (ref >60.00)
Glucose, Bld: 130 mg/dL — ABNORMAL HIGH (ref 70–99)
Potassium: 4.2 mEq/L (ref 3.5–5.1)
Sodium: 141 mEq/L (ref 135–145)

## 2019-01-17 LAB — CBC WITH DIFFERENTIAL/PLATELET
Basophils Absolute: 0 10*3/uL (ref 0.0–0.1)
Basophils Relative: 0.6 % (ref 0.0–3.0)
Eosinophils Absolute: 0.1 10*3/uL (ref 0.0–0.7)
Eosinophils Relative: 2.1 % (ref 0.0–5.0)
HCT: 28.6 % — ABNORMAL LOW (ref 36.0–46.0)
Hemoglobin: 9.4 g/dL — ABNORMAL LOW (ref 12.0–15.0)
Lymphocytes Relative: 31.7 % (ref 12.0–46.0)
Lymphs Abs: 1.5 10*3/uL (ref 0.7–4.0)
MCHC: 32.8 g/dL (ref 30.0–36.0)
MCV: 87.3 fl (ref 78.0–100.0)
Monocytes Absolute: 0.3 10*3/uL (ref 0.1–1.0)
Monocytes Relative: 6.5 % (ref 3.0–12.0)
Neutro Abs: 2.8 10*3/uL (ref 1.4–7.7)
Neutrophils Relative %: 59.1 % (ref 43.0–77.0)
Platelets: 175 10*3/uL (ref 150.0–400.0)
RBC: 3.28 Mil/uL — ABNORMAL LOW (ref 3.87–5.11)
RDW: 13.3 % (ref 11.5–15.5)
WBC: 4.8 10*3/uL (ref 4.0–10.5)

## 2019-01-17 LAB — HEMOGLOBIN A1C: Hgb A1c MFr Bld: 5.8 % (ref 4.6–6.5)

## 2019-01-23 ENCOUNTER — Other Ambulatory Visit: Payer: Self-pay | Admitting: Internal Medicine

## 2019-01-27 ENCOUNTER — Other Ambulatory Visit: Payer: Self-pay | Admitting: Internal Medicine

## 2019-01-27 DIAGNOSIS — D631 Anemia in chronic kidney disease: Secondary | ICD-10-CM | POA: Diagnosis not present

## 2019-01-27 DIAGNOSIS — I129 Hypertensive chronic kidney disease with stage 1 through stage 4 chronic kidney disease, or unspecified chronic kidney disease: Secondary | ICD-10-CM | POA: Diagnosis not present

## 2019-01-27 DIAGNOSIS — N184 Chronic kidney disease, stage 4 (severe): Secondary | ICD-10-CM | POA: Diagnosis not present

## 2019-01-27 DIAGNOSIS — N189 Chronic kidney disease, unspecified: Secondary | ICD-10-CM | POA: Diagnosis not present

## 2019-01-27 DIAGNOSIS — E871 Hypo-osmolality and hyponatremia: Secondary | ICD-10-CM | POA: Diagnosis not present

## 2019-01-27 DIAGNOSIS — I739 Peripheral vascular disease, unspecified: Secondary | ICD-10-CM | POA: Diagnosis not present

## 2019-01-31 ENCOUNTER — Other Ambulatory Visit: Payer: Self-pay | Admitting: Nephrology

## 2019-01-31 DIAGNOSIS — H35033 Hypertensive retinopathy, bilateral: Secondary | ICD-10-CM | POA: Diagnosis not present

## 2019-01-31 DIAGNOSIS — H35373 Puckering of macula, bilateral: Secondary | ICD-10-CM | POA: Diagnosis not present

## 2019-01-31 DIAGNOSIS — H524 Presbyopia: Secondary | ICD-10-CM | POA: Diagnosis not present

## 2019-01-31 DIAGNOSIS — H1131 Conjunctival hemorrhage, right eye: Secondary | ICD-10-CM | POA: Diagnosis not present

## 2019-01-31 DIAGNOSIS — H40023 Open angle with borderline findings, high risk, bilateral: Secondary | ICD-10-CM | POA: Diagnosis not present

## 2019-01-31 DIAGNOSIS — N184 Chronic kidney disease, stage 4 (severe): Secondary | ICD-10-CM

## 2019-02-03 ENCOUNTER — Other Ambulatory Visit: Payer: Self-pay | Admitting: Nephrology

## 2019-02-03 DIAGNOSIS — N184 Chronic kidney disease, stage 4 (severe): Secondary | ICD-10-CM
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
## 2019-02-04 ENCOUNTER — Encounter: Payer: Self-pay | Admitting: Physician Assistant

## 2019-02-04 ENCOUNTER — Other Ambulatory Visit: Payer: Self-pay

## 2019-02-04 ENCOUNTER — Ambulatory Visit (INDEPENDENT_AMBULATORY_CARE_PROVIDER_SITE_OTHER): Payer: Medicare Other | Admitting: Physician Assistant

## 2019-02-04 VITALS — BP 130/46 | HR 68 | Ht 61.0 in | Wt 174.0 lb

## 2019-02-04 DIAGNOSIS — I10 Essential (primary) hypertension: Secondary | ICD-10-CM

## 2019-02-04 DIAGNOSIS — I5032 Chronic diastolic (congestive) heart failure: Secondary | ICD-10-CM | POA: Diagnosis not present

## 2019-02-04 DIAGNOSIS — I272 Pulmonary hypertension, unspecified: Secondary | ICD-10-CM

## 2019-02-04 DIAGNOSIS — N184 Chronic kidney disease, stage 4 (severe): Secondary | ICD-10-CM

## 2019-02-04 NOTE — Patient Instructions (Signed)
Medication Instructions:   Your physician recommends that you continue on your current medications as directed. Please refer to the Current Medication list given to you today.  If you need a refill on your cardiac medications before your next appointment, please call your pharmacy.   Lab work:  None ordered today  Testing/Procedures:  None ordered today  Follow-Up: At [facility], you and your health needs are our priority.  As part of our continuing mission to provide you with exceptional heart care, we have created designated Provider Care Teams.  These Care Teams include your primary Cardiologist (physician) and Advanced Practice Providers (APPs -  Physician Assistants and Nurse Practitioners) who all work together to provide you with the care you need, when you need it. You will need a follow up appointment in:  6 months.  Please call our office 2 months in advance to schedule this appointment.  You may see Mertie Moores, MD or one of the following Advanced Practice Providers on your designated Care Team: Richardson Dopp, PA-C [place], [place] . Daune Perch, NP

## 2019-02-07 ENCOUNTER — Ambulatory Visit
Admission: RE | Admit: 2019-02-07 | Discharge: 2019-02-07 | Disposition: A | Discharge status: Home / Self Care | Payer: Medicare Other | Source: Ambulatory Visit | Attending: Nephrology | Admitting: Nephrology

## 2019-02-07 ENCOUNTER — Other Ambulatory Visit: Payer: Self-pay | Admitting: Internal Medicine

## 2019-02-07 DIAGNOSIS — N184 Chronic kidney disease, stage 4 (severe): Secondary | ICD-10-CM | POA: Diagnosis not present

## 2019-02-12 ENCOUNTER — Other Ambulatory Visit: Payer: Self-pay

## 2019-02-12 ENCOUNTER — Ambulatory Visit (INDEPENDENT_AMBULATORY_CARE_PROVIDER_SITE_OTHER): Payer: Medicare Other | Admitting: Orthopedic Surgery

## 2019-02-12 ENCOUNTER — Encounter: Payer: Self-pay | Admitting: Orthopedic Surgery

## 2019-02-12 ENCOUNTER — Ambulatory Visit: Payer: Self-pay

## 2019-02-12 DIAGNOSIS — M79605 Pain in left leg: Secondary | ICD-10-CM

## 2019-02-12 MED ORDER — PREDNISONE 5 MG (21) PO TBPK: ORAL_TABLET | ORAL | 0 refills | Status: DC

## 2019-02-12 MED ORDER — HYDROCODONE-ACETAMINOPHEN 5-325 MG PO TABS: 1.0000 | ORAL_TABLET | Freq: Two times a day (BID) | ORAL | 0 refills | Status: DC | PRN

## 2019-02-12 NOTE — Progress Notes (Signed)
Office Visit Note   Patient: [name]           Date of Birth: [birth year]           MRN: [id number] Visit Date: 02/12/2019 Requested by: Burnis Medin, MD [place],  [place] [address] PCP: Burnis Medin, MD  Subjective: Chief Complaint  Patient presents with  . Left Leg - Pain    HPI: [name] is an 85 year old patient with left leg and back pain.'s been going on for a month.  Reports radicular pain radiating down into the left leg.  MRI scan in 2017 showed L4-5 disc bulge as well as L5-S1 disc degeneration and facet degeneration.  No relief from Tylenol.  Patient does wake from sleep at night.  She does report mildly decreased walking endurance.  No prior history of back surgery.              ROS: All systems reviewed are negative as they relate to the chief complaint within the history of present illness.  Patient denies  fevers or chills.   Assessment & Plan: Visit Diagnoses:  1. Pain in left leg     Plan: Impression is left leg and back pain.  Plan Medrol Dosepak plus Norco No. 25 1 to 2/day.  Refer to Dr. Ernestina Patches for L spine ESI.  Patient is on Plavix.  Follow-up with me as needed.  Follow-Up Instructions: No follow-ups on file.   Orders:  Orders Placed This Encounter  Procedures  . XR Lumbar Spine 2-3 Views  . Ambulatory referral to Physical Medicine Rehab   Meds ordered this encounter  Medications  . HYDROcodone-acetaminophen (NORCO/VICODIN) 5-325 MG tablet    Sig: Take 1 tablet by mouth every 12 (twelve) hours as needed for moderate pain.    Dispense:  14 tablet    Refill:  0  . predniSONE (STERAPRED UNI-PAK 21 TAB) 5 MG (21) TBPK tablet    Sig: Take dosepack as directed    Dispense:  21 tablet    Refill:  0      Procedures: No procedures performed   Clinical Data: No additional findings.  Objective: Vital Signs: There were no vitals taken for this visit.  Physical Exam:   Constitutional: Patient appears well-developed  HEENT:  Head: Normocephalic Eyes:EOM are normal Neck: Normal range of motion Cardiovascular: Normal rate Pulmonary/chest: Effort normal Neurologic: Patient is alert Skin: Skin is warm Psychiatric: Patient has normal mood and affect    Ortho Exam: Ortho exam demonstrates left ankle swelling and mild arthritis.  She does have pretty reasonable ankle dorsiflexion plantarflexion quad hamstring strength bilaterally with no definite paresthesias.  Does have pain with forward lateral bending.  Not much of a groin pain with internal X rotation of the leg.  No other masses lymphadenopathy or skin changes noted in that back region.  Specialty Comments:  No specialty comments available.  Imaging: Xr Lumbar Spine 2-3 Views  Result Date: 02/12/2019 AP lateral lumbar spine reviewed.  L5-S1 degenerative arthritis is present with the space narrowing and significant facet arthritis.  No acute compression fracture.  Visualized hips appear to be mildly arthritic.    PMFS History: Patient Active Problem List   Diagnosis Date Noted  . Nausea 11/03/2018  . Elevated troponin 11/03/2018  . Hyponatremia 11/03/2018  . Hypertensive heart disease with hypertensive chronic kidney disease 11/30/2017  . Fasting hyperglycemia 11/30/2017  . Renal cyst 03/27/2016  . CKD (chronic kidney disease) stage 3, GFR  30-59 ml/min ([place]) 08/23/2015  . Perceived hearing changes 06/29/2014  . Resistant hypertension 06/29/2014  . Lumbago 06/15/2014  . Pre-diabetes vs early DM  06/15/2014  . Dyspnea 04/27/2014  . Asthma, chronic 04/13/2014  . Elevated uric acid in blood 04/13/2014  . Essential hypertension 03/27/2014  . History of DVT (deep vein thrombosis)   . Palpitations 01/05/2014  . Anemia of chronic disease Jan 16, 2014  . Death of family member 09/20/13  . Leg edema 06/20/2013  . Bereavement due to life event 04/21/2013  . Other dysphagia 02/11/2013  . Colon cancer screening 02/11/2013  . Anxiety state  01/20/2013  . Leg cramps 01/20/2013  . Back pain 12/05/2012  . Family hx of colon cancer 10/21/2012  . Family hx of lung cancer 10/21/2012  . Family hx-breast malignancy 10/21/2012  . Renal insufficiency creatinine 1.3 10/21/2012  . Anemia, chronic disease 10/21/2012  . GERD (gastroesophageal reflux disease) 09/08/2012  . HTN (hypertension) 09/08/2012  . Hyperlipidemia 09/08/2012  . Asthma 09/08/2012  . Varicose veins of leg with complications Q000111Q   Past Medical History:  Diagnosis Date  . Acute superficial venous thrombosis of left lower extremity 03/27/2014   concern about hx and potential extensive on exam tenderness calf and low dose lovenox 40 qd 2-4 weekselevetion and close  fu advised .   [name] Allergy   . Anemia   . Anxiety   . Arthritis    "shoulders" (09/09/2012)  . Asthma 09/08/2012  . Carotid bruit    [place] 2018  low risk 1 - 39%  . Diabetes mellitus without complication ([place])    "Borderline" per pt; being monitored.  no meds per pt  . Dyspnea 04/27/2014  . GERD (gastroesophageal reflux disease)   . Headache(784.0)    "related to my high blood pressure" (09/09/2012)  . History of DVT (deep vein thrombosis)    "RLE" (09/09/2012) coumadine cant take asa so on plavix   . HTN (hypertension) 09/08/2012  . Hyperlipidemia   . Lupus ([place])    "cured years ago" (09/09/2012)  . Other dysphagia 02/11/2013  . Syncope and collapse 01/21/2018  . Varicose veins   . Varicose veins of leg with complications [id number]    Family History  Problem Relation Age of Onset  . Hypertension Mother   . Breast cancer Mother   . Hypertension Father   . Cancer Sister        Colon and Breast Cancer  . Breast cancer Sister   . Colon cancer Sister        ? 29' s dx - died in 25's  . Hypertension Brother   . Rectal cancer Brother   . Stomach cancer Brother   . Breast cancer Brother   . Heart attack Daughter   . Esophageal cancer Daughter   . Lung cancer Other        both parents   .  Breast cancer Paternal Aunt     Past Surgical History:  Procedure Laterality Date  . ABDOMINAL HYSTERECTOMY     partial  . BREAST EXCISIONAL BIOPSY Right   . BREAST SURGERY    . CARPAL TUNNEL RELEASE Right 2000's  . CARPAL TUNNEL RELEASE Bilateral 10/21/2013   Procedure: BILATERAL  CARPAL TUNNEL RELEASE;  Surgeon: [name], MD;  Location: [facility];  Service: Orthopedics;  Laterality: Bilateral;  . SHOULDER OPEN ROTATOR CUFF REPAIR Bilateral 2000's  . TRIGGER FINGER RELEASE Right 10/21/2013   Procedure: RELEASE TRIGGER FINGER/A-1 PULLEY RIGHT MIDDLE AND RIGHT  RING;  Surgeon: [name], MD;  Location: [facility];  Service: Orthopedics;  Laterality: Right;   Social History   Occupational History  . Not on file  Tobacco Use  . Smoking status: Never Smoker  . Smokeless tobacco: Never Used  Substance and Sexual Activity  . Alcohol use: No  . Drug use: No  . Sexual activity: Not on file

## 2019-02-18 ENCOUNTER — Other Ambulatory Visit: Payer: Self-pay | Admitting: Internal Medicine

## 2019-02-28 ENCOUNTER — Other Ambulatory Visit: Payer: Self-pay | Admitting: Internal Medicine

## 2019-03-05 DIAGNOSIS — M109 Gout, unspecified: Secondary | ICD-10-CM | POA: Diagnosis not present

## 2019-03-05 DIAGNOSIS — Z8739 Personal history of other diseases of the musculoskeletal system and connective tissue: Secondary | ICD-10-CM | POA: Diagnosis not present

## 2019-03-05 DIAGNOSIS — E871 Hypo-osmolality and hyponatremia: Secondary | ICD-10-CM | POA: Diagnosis not present

## 2019-03-05 DIAGNOSIS — N184 Chronic kidney disease, stage 4 (severe): Secondary | ICD-10-CM | POA: Diagnosis not present

## 2019-03-05 DIAGNOSIS — I129 Hypertensive chronic kidney disease with stage 1 through stage 4 chronic kidney disease, or unspecified chronic kidney disease: Secondary | ICD-10-CM | POA: Diagnosis not present

## 2019-03-11 ENCOUNTER — Other Ambulatory Visit: Payer: Self-pay | Admitting: Internal Medicine

## 2019-03-18 ENCOUNTER — Other Ambulatory Visit: Payer: Self-pay | Admitting: Internal Medicine

## 2019-03-22 ENCOUNTER — Other Ambulatory Visit: Payer: Self-pay | Admitting: Internal Medicine

## 2019-03-30 ENCOUNTER — Other Ambulatory Visit: Payer: Self-pay | Admitting: Internal Medicine

## 2019-04-02 ENCOUNTER — Ambulatory Visit: Payer: Medicare Other | Admitting: Vascular Surgery

## 2019-04-03 ENCOUNTER — Encounter: Payer: Self-pay | Admitting: Family

## 2019-04-09 ENCOUNTER — Other Ambulatory Visit: Payer: Self-pay

## 2019-04-13 ENCOUNTER — Other Ambulatory Visit: Payer: Self-pay | Admitting: Internal Medicine

## 2019-04-14 NOTE — Telephone Encounter (Signed)
Sent in electronically .  

## 2019-04-14 NOTE — Telephone Encounter (Signed)
Last ov:01/08/2019 Last filled:11/29/2018

## 2019-04-16 DIAGNOSIS — N184 Chronic kidney disease, stage 4 (severe): Secondary | ICD-10-CM | POA: Diagnosis not present

## 2019-04-16 DIAGNOSIS — M109 Gout, unspecified: Secondary | ICD-10-CM | POA: Diagnosis not present

## 2019-04-16 DIAGNOSIS — I739 Peripheral vascular disease, unspecified: Secondary | ICD-10-CM | POA: Diagnosis not present

## 2019-04-16 DIAGNOSIS — I129 Hypertensive chronic kidney disease with stage 1 through stage 4 chronic kidney disease, or unspecified chronic kidney disease: Secondary | ICD-10-CM | POA: Diagnosis not present

## 2019-04-23 ENCOUNTER — Ambulatory Visit: Payer: Medicare Other | Admitting: Vascular Surgery

## 2019-04-23 ENCOUNTER — Other Ambulatory Visit: Payer: Self-pay

## 2019-04-23 ENCOUNTER — Encounter: Payer: Self-pay | Admitting: Vascular Surgery

## 2019-04-23 VITALS — BP 137/66 | HR 59 | Temp 97.9°F | Resp 14 | Ht 61.0 in | Wt 170.0 lb

## 2019-04-23 DIAGNOSIS — I83812 Varicose veins of left lower extremities with pain: Secondary | ICD-10-CM

## 2019-04-23 NOTE — Progress Notes (Signed)
Patient is an 85 year old female who returns for follow-up today.  She was last seen August 2020.  At that time she was complaining of leg swelling.  She also had some occasional pain in her left calf and front of her left leg.  Duplex ultrasound at that time showed reflux in the left greater saphenous vein.  She was given a prescription for lower extremity compression stockings.  She has been wearing compression stockings fairly reliably since that time.  She states that they helped a little bit but she still also still has aching and pain and swelling in the left leg.  She does occasionally have some swelling in the right leg but states the left leg is worse.  Review of systems: She has shortness of breath with exertion.  She does not have chest pain.  Physical exam:  Vitals:   04/23/19 1137  BP: 137/66  Pulse: (!) 59  Resp: 14  Temp: 97.9 F (36.6 C)  TempSrc: Temporal  Weight: 170 lb (77.1 kg)  Height: 5\' 1"  (1.549 m)    Extremities: 1+ dorsalis pedis pulses bilaterally no obvious significant surface varicosities  Data: I reviewed the patient's prior duplex exam which showed diffuse reflux in the left greater saphenous vein.  I examined the patient at the bedside today with the SonoSite which shows a diffusely uniform 4 mm left greater saphenous vein with no significant tortuosity.  Assessment: Symptomatic varicose veins left leg.  Patient is going to discuss with her family further whether or not she wishes to consider laser ablation for improvement of her left leg pain and swelling symptoms or continue with conservative measures with compression therapy alone.  I did discuss with the patient and her daughter today that this is a nuisance type problem and is not a limb threatening situation.  They understand.  I also discussed with him the risk benefits possible complications of procedure details of laser ablation including not limited to DVT risk postoperative pain versus benefits of  improved symptoms of swelling in left leg left leg pain.  We did also discussed that the vein procedure may not completely alleviate all of her aches and pains in her left leg.  Some of this may be due to degenerative arthritis.  Plan: Patient will follow-up on an as-needed basis if she wishes to pursue left leg laser ablation in the near future.  Ruta Hinds, MD [facility] Office: [phone]

## 2019-05-01 ENCOUNTER — Other Ambulatory Visit: Payer: Self-pay | Admitting: Internal Medicine

## 2019-05-11 ENCOUNTER — Other Ambulatory Visit: Payer: Self-pay | Admitting: Internal Medicine

## 2019-05-12 ENCOUNTER — Telehealth: Payer: Self-pay

## 2019-05-12 NOTE — Telephone Encounter (Signed)
Copied from [place] [id number]. Topic: General - Other >> May 12, 2019  3:02 PM Leward Quan A wrote: Reason for CRM: Patient granddaughter [name] called to say that patient would like to be seen in the office physically but next available was not till 06/04/19 and she would like something before that date please. Please call back at Ph# [phone]

## 2019-05-12 NOTE — Telephone Encounter (Signed)
Pt has been scheduled.  °

## 2019-05-13 ENCOUNTER — Ambulatory Visit: Payer: Medicare Other | Admitting: Internal Medicine

## 2019-05-15 ENCOUNTER — Ambulatory Visit: Payer: Medicare Other | Attending: Internal Medicine

## 2019-05-15 DIAGNOSIS — Z20822 Contact with and (suspected) exposure to covid-19: Secondary | ICD-10-CM

## 2019-05-17 LAB — NOVEL CORONAVIRUS, NAA: SARS-CoV-2, NAA: NOT DETECTED

## 2019-05-21 ENCOUNTER — Encounter: Payer: Self-pay | Admitting: Internal Medicine

## 2019-05-21 ENCOUNTER — Other Ambulatory Visit: Payer: Self-pay

## 2019-05-21 ENCOUNTER — Ambulatory Visit (INDEPENDENT_AMBULATORY_CARE_PROVIDER_SITE_OTHER): Payer: Medicare Other | Admitting: Internal Medicine

## 2019-05-21 VITALS — BP 138/70 | HR 65 | Temp 97.6°F | Ht 61.0 in | Wt 177.2 lb

## 2019-05-21 DIAGNOSIS — D638 Anemia in other chronic diseases classified elsewhere: Secondary | ICD-10-CM | POA: Diagnosis not present

## 2019-05-21 DIAGNOSIS — N289 Disorder of kidney and ureter, unspecified: Secondary | ICD-10-CM | POA: Diagnosis not present

## 2019-05-21 DIAGNOSIS — J45909 Unspecified asthma, uncomplicated: Secondary | ICD-10-CM

## 2019-05-21 DIAGNOSIS — R7303 Prediabetes: Secondary | ICD-10-CM

## 2019-05-21 DIAGNOSIS — Z79899 Other long term (current) drug therapy: Secondary | ICD-10-CM

## 2019-05-21 DIAGNOSIS — N184 Chronic kidney disease, stage 4 (severe): Secondary | ICD-10-CM

## 2019-05-21 DIAGNOSIS — F411 Generalized anxiety disorder: Secondary | ICD-10-CM

## 2019-05-21 LAB — BASIC METABOLIC PANEL
BUN: 49 mg/dL — ABNORMAL HIGH (ref 6–23)
CO2: 29 mEq/L (ref 19–32)
Calcium: 9.3 mg/dL (ref 8.4–10.5)
Chloride: 102 mEq/L (ref 96–112)
Creatinine, Ser: 2.57 mg/dL — ABNORMAL HIGH (ref 0.40–1.20)
GFR: 21.54 mL/min — ABNORMAL LOW (ref >60.00)
Glucose, Bld: 122 mg/dL — ABNORMAL HIGH (ref 70–99)
Potassium: 4 mEq/L (ref 3.5–5.1)
Sodium: 141 mEq/L (ref 135–145)

## 2019-05-21 LAB — IBC + FERRITIN
Ferritin: 93.5 ng/mL (ref 10.0–291.0)
Iron: 78 ug/dL (ref 42–145)
Saturation Ratios: 33.2 % (ref 20.0–50.0)
Transferrin: 168 mg/dL — ABNORMAL LOW (ref 212.0–360.0)

## 2019-05-21 LAB — CBC WITH DIFFERENTIAL/PLATELET
Basophils Absolute: 0 10*3/uL (ref 0.0–0.1)
Basophils Relative: 0.7 % (ref 0.0–3.0)
Eosinophils Absolute: 0.1 10*3/uL (ref 0.0–0.7)
Eosinophils Relative: 3.4 % (ref 0.0–5.0)
HCT: 30.2 % — ABNORMAL LOW (ref 36.0–46.0)
Hemoglobin: 9.9 g/dL — ABNORMAL LOW (ref 12.0–15.0)
Lymphocytes Relative: 25.6 % (ref 12.0–46.0)
Lymphs Abs: 1.1 10*3/uL (ref 0.7–4.0)
MCHC: 32.9 g/dL (ref 30.0–36.0)
MCV: 86.6 fl (ref 78.0–100.0)
Monocytes Absolute: 0.3 10*3/uL (ref 0.1–1.0)
Monocytes Relative: 8.1 % (ref 3.0–12.0)
Neutro Abs: 2.6 10*3/uL (ref 1.4–7.7)
Neutrophils Relative %: 62.2 % (ref 43.0–77.0)
Platelets: 177 10*3/uL (ref 150.0–400.0)
RBC: 3.48 Mil/uL — ABNORMAL LOW (ref 3.87–5.11)
RDW: 13.6 % (ref 11.5–15.5)
WBC: 4.1 10*3/uL (ref 4.0–10.5)

## 2019-05-21 LAB — HEMOGLOBIN A1C: Hgb A1c MFr Bld: 5.9 % (ref 4.6–6.5)

## 2019-05-21 MED ORDER — ALBUTEROL SULFATE HFA 108 (90 BASE) MCG/ACT IN AERS: INHALATION_SPRAY | RESPIRATORY_TRACT | 3 refills | Status: DC

## 2019-05-21 MED ORDER — LORAZEPAM 1 MG PO TABS: ORAL_TABLET | ORAL | 0 refills | Status: DC

## 2019-05-21 NOTE — Patient Instructions (Signed)
Glad you are doing ok  Plan to send  Results to your specialists   Will refill lorazepam  And albuterol.   Get the covid 19 vaccine when available.    Plan rov in 4-6 months depending on how doing etc.

## 2019-05-21 NOTE — Progress Notes (Signed)
This visit occurred during the SARS-CoV-2 public health emergency.  Safety protocols were in place, including screening questions prior to the visit, additional usage of staff PPE, and extensive cleaning of exam room while observing appropriate contact time as indicated for disinfecting solutions.    Chief Complaint  Patient presents with  . Annual Exam    Pt states she is here for her physical and labs no other concerns     HPI: [name] 86 y.o. come in for Chronic disease management  Here with    Family member    Had recent  copvid screen   For travel but decided not to  No exposures    resp : Breathing ok   rx bid.     A;buterol  Needs refill  pulm  Provider moving to hospital   .  But ding  Ok on advair asks refill  Anxiety on meds and ocasss ativan use but  Not very much  Staying at home most of the time with covid shut down   Renal disease Last lab per dr Hollie Salk? A few month ago  Has fu next month .   Hydralazine down to 50 tid  But no lasix 120 bid  Had episode of low sodium  But now better    BG  Pre diabetesto follow   Up again  Had been stable   Left leg swelling  Some venous insufficiency    May go for procedure  ROS: See pertinent positives and negatives per HPI. No falling bleeding  Change vision hearing Lost family members in past summer fall  Past Medical History:  Diagnosis Date  . Acute superficial venous thrombosis of left lower extremity 03/27/2014   concern about hx and potential extensive on exam tenderness calf and low dose lovenox 40 qd 2-4 weekselevetion and close  fu advised .   [name] Allergy   . Anemia   . Anxiety   . Arthritis    "shoulders" (09/09/2012)  . Asthma 09/08/2012  . Carotid bruit    [place] 2018  low risk 1 - 39%  . Diabetes mellitus without complication ([place])    "Borderline" per pt; being monitored.  no meds per pt  . Dyspnea 04/27/2014  . GERD (gastroesophageal reflux disease)   . Headache(784.0)    "related to my high blood  pressure" (09/09/2012)  . History of DVT (deep vein thrombosis)    "RLE" (09/09/2012) coumadine cant take asa so on plavix   . HTN (hypertension) 09/08/2012  . Hyperlipidemia   . Lupus ([place])    "cured years ago" (09/09/2012)  . Other dysphagia 02/11/2013  . Syncope and collapse 01/21/2018  . Varicose veins   . Varicose veins of leg with complications [id number]    Family History  Problem Relation Age of Onset  . Hypertension Mother   . Breast cancer Mother   . Hypertension Father   . Cancer Sister        Colon and Breast Cancer  . Breast cancer Sister   . Colon cancer Sister        ? 39' s dx - died in 28's  . Hypertension Brother   . Rectal cancer Brother   . Stomach cancer Brother   . Breast cancer Brother   . Heart attack Daughter   . Esophageal cancer Daughter   . Lung cancer Other        both parents   . Breast cancer Paternal Aunt  Outpatient Medications Prior to Visit  Medication Sig Dispense Refill  . acetaminophen (TYLENOL) 500 MG tablet Take 1,000 mg by mouth every 6 (six) hours as needed for moderate pain. Reported on 04/30/2015    . albuterol (PROVENTIL) (2.5 MG/3ML) 0.083% nebulizer solution USE 1 AMPULE EVERY 6 HOURS AS NEEDED FOR WHEEZE 75 mL 0  . amLODipine-olmesartan (AZOR) 10-40 MG tablet Take 1 tablet by mouth daily.    [name] atorvastatin (LIPITOR) 20 MG tablet TAKE 1 TABLET ONCE DAILY. 90 tablet 0  . carvedilol (COREG) 6.25 MG tablet Take 6.25 mg by mouth daily.    . citalopram (CELEXA) 20 MG tablet TAKE 1 TABLET ONCE DAILY. 30 tablet 0  . clopidogrel (PLAVIX) 75 MG tablet TAKE 1 TABLET ONCE DAILY. 30 tablet 0  . diclofenac sodium (VOLTAREN) 1 % GEL APPLY 4GM TO AFFECTED AREA(S) 4 TIMES A DAY. 100 g 0  . doxazosin (CARDURA) 2 MG tablet TAKE 3 TABLETS AT BEDTIME. 90 tablet 0  . ferrous sulfate 325 (65 FE) MG EC tablet Take 1 tablet (325 mg total) by mouth daily with breakfast. 30 tablet 3  . fluorometholone (FML) 0.1 % ophthalmic suspension Place 1 drop  into both eyes 2 (two) times daily.     . Fluticasone-Salmeterol (ADVAIR) 250-50 MCG/DOSE AEPB USE 1 PUFF TWICE DAILY. 60 each 6  . furosemide (LASIX) 40 MG tablet Take 80 mg by mouth 3 (three) times daily.     . hydrALAZINE (APRESOLINE) 100 MG tablet Take 50 mg by mouth 3 (three) times daily.     [name] HYDROcodone-acetaminophen (NORCO/VICODIN) 5-325 MG tablet Take 1 tablet by mouth every 12 (twelve) hours as needed for moderate pain. 14 tablet 0  . lactobacillus acidophilus & bulgar (LACTINEX) chewable tablet Chew 1 tablet by mouth 3 (three) times daily with meals. 30 tablet 0  . lidocaine (LIDODERM) 5 % Place 1 patch onto the skin daily. Remove & Discard patch within 12 hours or as directed by MD 30 patch 0  . montelukast (SINGULAIR) 10 MG tablet TAKE 1 TABLET ONCE DAILY. 90 tablet 0  . ondansetron (ZOFRAN ODT) 4 MG disintegrating tablet Take 1 tablet (4 mg total) by mouth every 8 (eight) hours as needed for nausea or vomiting. 20 tablet 0  . pantoprazole (PROTONIX) 40 MG tablet TAKE (1) TABLET TWICE A DAY BEFORE MEALS. 180 tablet 0  . potassium chloride (KLOR-CON) 10 MEQ tablet TAKE 4 TABLETS DAILY. 120 tablet 0  . predniSONE (STERAPRED UNI-PAK 21 TAB) 5 MG (21) TBPK tablet Take dosepack as directed 21 tablet 0  . RESTASIS 0.05 % ophthalmic emulsion Place 1 drop into both eyes 2 (two) times daily.     [name] Spacer/Aero-Holding Chambers (AEROCHAMBER PLUS) inhaler Use as instructed 1 each 2  . vitamin B-12 (CYANOCOBALAMIN) 1000 MCG tablet Take 1,000 mcg by mouth daily.    [name] albuterol (VENTOLIN HFA) 108 (90 Base) MCG/ACT inhaler USE 2 PUFFS EVERY 6 HOURS AS NEEDED FOR WHEEZING. 8.5 g 0  . LORazepam (ATIVAN) 1 MG tablet TAKE 1 TABLET EVERY 8 HOURS AS NEEDED FOR ANXIETY. AVOID REGULAR USE. 24 tablet 0   No facility-administered medications prior to visit.     EXAM:  BP 138/70 (BP Location: Right Arm, Patient Position: Sitting, Cuff Size: Normal)   Pulse 65   Temp 97.6 F (36.4 C) (Temporal)   Ht 5'  1" (1.549 m)   Wt 177 lb 3.2 oz (80.4 kg)   SpO2 95%   BMI 33.48 kg/m  Body mass index is 33.48 kg/m.  GENERAL: vitals reviewed and listed above, alert, oriented, appears well hydrated and in no acute distress HEENT: atraumatic, conjunctiva  clear, no obvious abnormalities on inspection of external nose and ears OP : masked  NECK: no obvious masses on inspection palpation  LUNGS: clear to auscultation bilaterally, no wheezes, rales or rhonchi,  No resp distress CV: HRRR, no clubbing cyanosis o legs left leg 23+ 3dema and right  2 +  peripheral edema nl cap refill  MS: moves all extremities without noticeable focal  Abnormality  Ambulation independent and stead but  Careful  Skin no acute bruising  PSYCH: pleasant and cooperative, no obvious depression or anxiety Lab Results  Component Value Date   WBC 4.1 05/21/2019   HGB 9.9 (L) 05/21/2019   HCT 30.2 (L) 05/21/2019   PLT 177.0 05/21/2019   GLUCOSE 122 (H) 05/21/2019   CHOL 177 03/25/2018   TRIG 72.0 03/25/2018   HDL 73.60 03/25/2018   LDLCALC 89 03/25/2018   ALT 7 11/02/2018   AST 18 11/02/2018   NA 141 05/21/2019   K 4.0 05/21/2019   CL 102 05/21/2019   CREATININE 2.57 (H) 05/21/2019   BUN 49 (H) 05/21/2019   CO2 29 05/21/2019   TSH 1.31 07/09/2017   INR 0.94 12/22/2009   HGBA1C 5.9 05/21/2019   MICROALBUR 10.7 (H) 04/25/2016   BP Readings from Last 3 Encounters:  05/21/19 138/70  04/23/19 137/66  02/04/19 (!) 130/46   Wt Readings from Last 3 Encounters:  05/21/19 177 lb 3.2 oz (80.4 kg)  04/23/19 170 lb (77.1 kg)  02/04/19 174 lb (78.9 kg)    ASSESSMENT AND PLAN:  Discussed the following assessment and plan:  Chronic asthma without complication, unspecified asthma severity, unspecified whether persistent - Plan: Basic metabolic panel, CBC with Differential, Hemoglobin A1c, IBC + Ferritin [place]  Medication management - Plan: Basic metabolic panel, CBC with Differential, Hemoglobin A1c, IBC + Ferritin  [place]  Anemia of chronic disease - Plan: Basic metabolic panel, CBC with Differential, Hemoglobin A1c, IBC + Ferritin [place]  Anemia, chronic disease - Plan: Basic metabolic panel, CBC with Differential, Hemoglobin A1c, IBC + Ferritin [place]  Pre-diabetes - Plan: Basic metabolic panel, CBC with Differential, Hemoglobin A1c, IBC + Ferritin [place]  Renal insufficiency creatinine 1.3 - Plan: Basic metabolic panel, CBC with Differential, Hemoglobin A1c, IBC + Ferritin [place]  Chronic kidney disease (CKD), stage IV (severe) (HCC)  Anxiety state - stable on medication   at this time  refill rescue med .  Options to do lab moniotring today since in office anyway and send results to neph and  Heme   resp seem stable and refill meds  On high dose lasix   And lower dose hydralazine cause of hx of low bp at hight er doses  anxiety stable and limited use of benzo  Good family support  Get cofid vaccine when possible  -Patient advised to return or notify health care team  if  new concerns arise.  Patient Instructions  Glad you are doing ok  Plan to send  Results to your specialists   Will refill lorazepam  And albuterol.   Get the covid 19 vaccine when available.    Plan rov in 4-6 months depending on how doing etc.     Standley Brooking. Ayren Zumbro M.D.

## 2019-05-23 NOTE — Progress Notes (Signed)
Blood sugar is good  Kidney function about the same or slightly better   sodium level is fine  andemia slightly better  witll forward to  you specialists  for their review and input

## 2019-06-02 ENCOUNTER — Telehealth: Payer: Self-pay

## 2019-06-02 ENCOUNTER — Other Ambulatory Visit: Payer: Self-pay | Admitting: Internal Medicine

## 2019-06-02 NOTE — Telephone Encounter (Signed)
dmv form placed in red folder to be filled out

## 2019-06-03 ENCOUNTER — Other Ambulatory Visit: Payer: Self-pay | Admitting: Internal Medicine

## 2019-06-03 NOTE — Telephone Encounter (Signed)
Completed  the best possibble .  they may need  Eye doc to do the eye part.

## 2019-06-03 NOTE — Telephone Encounter (Signed)
This has been faxed back.  

## 2019-06-20 DIAGNOSIS — N184 Chronic kidney disease, stage 4 (severe): Secondary | ICD-10-CM | POA: Diagnosis not present

## 2019-06-20 DIAGNOSIS — I129 Hypertensive chronic kidney disease with stage 1 through stage 4 chronic kidney disease, or unspecified chronic kidney disease: Secondary | ICD-10-CM | POA: Diagnosis not present

## 2019-06-20 DIAGNOSIS — N2581 Secondary hyperparathyroidism of renal origin: Secondary | ICD-10-CM | POA: Diagnosis not present

## 2019-06-20 DIAGNOSIS — D631 Anemia in chronic kidney disease: Secondary | ICD-10-CM | POA: Diagnosis not present

## 2019-06-25 ENCOUNTER — Other Ambulatory Visit: Payer: Self-pay | Admitting: Internal Medicine

## 2019-07-01 ENCOUNTER — Other Ambulatory Visit: Payer: Self-pay | Admitting: Internal Medicine

## 2019-07-21 ENCOUNTER — Other Ambulatory Visit: Payer: Self-pay | Admitting: Internal Medicine

## 2019-07-25 ENCOUNTER — Other Ambulatory Visit: Payer: Self-pay | Admitting: Internal Medicine

## 2019-08-01 ENCOUNTER — Other Ambulatory Visit: Payer: Self-pay | Admitting: Internal Medicine

## 2019-08-05 ENCOUNTER — Encounter: Payer: Self-pay | Admitting: Cardiovascular Disease

## 2019-08-05 NOTE — Progress Notes (Signed)
Cardiology    Patient ID: [name] MRN: [id number]; DOB: 10/12/1933    Primary Care Provider: Burnis Medin, MD Primary Cardiologist: Mertie Moores, MD  Primary Electrophysiologist:  None   Problem list 1.  Hypertension 2.  Hyperlipidemia 3.  Prior DVT 4.  Lupus 5.  Chronic kidney disease 6.  Syncope 7.  Leg edema   Chief Complaint:  Syncope   Patient Profile:   [name] is a 86 y.o. female with history of hypertension and hyperlipidemia.  She was recently seen by Richardson Dopp, PA for an episode of syncope.  Seen in the past for an episode of leg swelling in 2015.      [name] is seen for further evaluation following her episode of syncope in July.  During that time, her metoprolol had just been changed to carvedilol.   Echocardiogram revealed normal left ventricular systolic function.  She has moderate pulmonary hypertension with an estimated PA pressure of 45. V Q scan was low risk for pulmonary embolus. She had a 30 day monitor that revealed normal sinus rhythm and sinus bradycardia.  She had one 5 beat run of ventricular ectopy which was not significant.  No further episodes of syncope  BP is still elevated.   Still eats some salty foods.   July, 23, 2020 :  [name] is seen for follow up visit BP is still mildy elevated.  Was hospitalized this past weekend.   Has vomitting and diarrhea.   These have resolved.  Troponin was 0.12   Still eating a very salty diet  Legs remains swollen .   Denies any chest pain .  Breathing is better.  Last echo was 5/19 which revealed normal LV function. Moderate pulmonary HTN - PA pressure of 45 .   August 06, 2019:  [name] is seen today for follow-up visit.  She has a history of hypertension.  She has a history of moderate pulmonary hypertension with an estimated PA pressure of 45. In the past she has been eating a very high salt diet.  She is doing well.  She is not had any further episodes of syncope  or presyncope.  She is not had any episodes of chest pain or shortness of breath.  She is been trying to avoid eating excessive salt.  Her blood pressure and heart rate are well controlled in the office visit today.  Past Medical History:  Diagnosis Date  . Acute superficial venous thrombosis of left lower extremity 03/27/2014   concern about hx and potential extensive on exam tenderness calf and low dose lovenox 40 qd 2-4 weekselevetion and close  fu advised .   [name] Allergy   . Anemia   . Anxiety   . Arthritis    "shoulders" (09/09/2012)  . Asthma 09/08/2012  . Carotid bruit    [place] 2018  low risk 1 - 39%  . Diabetes mellitus without complication ([place])    "Borderline" per pt; being monitored.  no meds per pt  . Dyspnea 04/27/2014  . GERD (gastroesophageal reflux disease)   . Headache(784.0)    "related to my high blood pressure" (09/09/2012)  . History of DVT (deep vein thrombosis)    "RLE" (09/09/2012) coumadine cant take asa so on plavix   . HTN (hypertension) 09/08/2012  . Hyperlipidemia   . Lupus ([place])    "cured years ago" (09/09/2012)  . Other dysphagia 02/11/2013  . Syncope and collapse 01/21/2018  . Varicose veins   . Varicose veins  of leg with complications 10/17/5407    Past Surgical History:  Procedure Laterality Date  . ABDOMINAL HYSTERECTOMY     partial  . BREAST EXCISIONAL BIOPSY Right   . BREAST SURGERY    . CARPAL TUNNEL RELEASE Right 2000's  . CARPAL TUNNEL RELEASE Bilateral 10/21/2013   Procedure: BILATERAL  CARPAL TUNNEL RELEASE;  Surgeon: [name], MD;  Location: [facility];  Service: Orthopedics;  Laterality: Bilateral;  . SHOULDER OPEN ROTATOR CUFF REPAIR Bilateral 2000's  . TRIGGER FINGER RELEASE Right 10/21/2013   Procedure: RELEASE TRIGGER FINGER/A-1 PULLEY RIGHT MIDDLE AND RIGHT RING;  Surgeon: [name], MD;  Location: [facility];  Service: Orthopedics;  Laterality: Right;     Medications Prior to Admission: Prior to  Admission medications   Medication Sig Start Date End Date Taking? Authorizing Provider  acetaminophen (TYLENOL) 500 MG tablet Take 1,000 mg by mouth every 6 (six) hours as needed for moderate pain. Reported on 04/30/2015   Yes [provider]  albuterol (PROVENTIL) (2.5 MG/3ML) 0.083% nebulizer solution USE 1 AMPULE EVERY 6 HOURS AS NEEDED FOR WHEEZE 10/23/17  Yes Panosh, Standley Brooking, MD  amLODipine-olmesartan (AZOR) 10-40 MG tablet Take 1 tablet by mouth daily.   Yes Madelon Lips, MD  carvedilol (COREG) 12.5 MG tablet Take 6.25 mg by mouth daily. 1/2 tablet daily   Yes Madelon Lips, MD  citalopram (CELEXA) 20 MG tablet TAKE 1 TABLET ONCE DAILY. 12/14/17  Yes Panosh, Standley Brooking, MD  clopidogrel (PLAVIX) 75 MG tablet TAKE 1 TABLET ONCE DAILY. 10/19/17  Yes Panosh, Standley Brooking, MD  diclofenac sodium (VOLTAREN) 1 % GEL APPLY 4GM TO AFFECTED AREA(S) 4 TIMES A DAY. 07/13/17  Yes Panosh, Standley Brooking, MD  doxazosin (CARDURA) 2 MG tablet TAKE 3 TABLETS AT BEDTIME. 12/13/17  Yes Panosh, Standley Brooking, MD  fenofibrate 160 MG tablet TAKE 1 TABLET ONCE DAILY. 12/13/17  Yes Panosh, Standley Brooking, MD  ferrous sulfate 325 (65 FE) MG EC tablet Take 1 tablet (325 mg total) by mouth daily with breakfast. 12/15/14  Yes Panosh, Standley Brooking, MD  fluorometholone (FML) 0.1 % ophthalmic suspension Place 1 drop into both eyes 2 (two) times daily.  09/13/17  Yes [provider]  Fluticasone-Salmeterol (ADVAIR) 250-50 MCG/DOSE AEPB USE 1 PUFF TWICE DAILY. 11/09/17  Yes Panosh, Standley Brooking, MD  furosemide (LASIX) 80 MG tablet Take 80 mg by mouth daily.   Yes Madelon Lips, MD  hydrALAZINE (APRESOLINE) 25 MG tablet Take 1 tablet by mouth daily. 12/03/17  Yes [provider]  lidocaine (LIDODERM) 5 % Place 1 patch onto the skin daily. Remove & Discard patch within 12 hours or as directed by MD Patient taking differently: Place 1 patch onto the skin daily as needed (pain). Remove & Discard patch within 12 hours or as directed by MD 01/04/17   Yes [place], Orson Ape, FNP  LORazepam (ATIVAN) 1 MG tablet Take 1 tablet (1 mg total) by mouth every 8 (eight) hours as needed for anxiety. Avoid regular use 08/28/17  Yes Panosh, Standley Brooking, MD  montelukast (SINGULAIR) 10 MG tablet TAKE 1 TABLET ONCE DAILY. 10/31/17  Yes Panosh, Standley Brooking, MD  pantoprazole (PROTONIX) 40 MG tablet TAKE (1) TABLET TWICE A DAY BEFORE MEALS. 10/23/17  Yes Panosh, Standley Brooking, MD  potassium chloride (K-DUR,KLOR-CON) 10 MEQ tablet Take 10 mEq by mouth 4 (four) times daily.   Yes [provider]  PROAIR HFA 108 [phone] Base) MCG/ACT inhaler USE 2 PUFFS  EVERY 6 HOURS AS NEEDED FOR WHEEZING. 12/13/17  Yes Panosh, Standley Brooking, MD  RESTASIS 0.05 % ophthalmic emulsion Place 1 drop into both eyes 2 (two) times daily.  01/01/15  Yes [provider]  Spacer/Aero-Holding Chambers (AEROCHAMBER PLUS) inhaler Use as instructed 05/26/16  Yes Melynda Ripple, MD  vitamin B-12 (CYANOCOBALAMIN) 1000 MCG tablet Take 1,000 mcg by mouth daily.   Yes [provider]     Allergies:    Allergies  Allergen Reactions  . Penicillins Nausea And Vomiting and Other (See Comments)    Has patient had a PCN reaction causing immediate rash, facial/tongue/throat swelling, SOB or lightheadedness with hypotension: Y Has patient had a PCN reaction causing severe rash involving mucus membranes or skin necrosis: Y Has patient had a PCN reaction that required hospitalization: N Has patient had a PCN reaction occurring within the last 10 years: N If all of the above answers are "NO", then may proceed with Cephalosporin use.   . Aspirin Other (See Comments)    Wheezing Acetaminophen is OK    Social History:   Social History   Socioeconomic History  . Marital status: Single    Spouse name: Not on file  . Number of children: 6  . Years of education: Not on file  . Highest education level: Not on file  Occupational History  . Not on file  Tobacco Use  . Smoking status: Never Smoker  .  Smokeless tobacco: Never Used  Substance and Sexual Activity  . Alcohol use: No  . Drug use: No  . Sexual activity: Not on file  Other Topics Concern  . Not on file  Social History Narrative   2 people living in the home.  Grand daughter.   Up and down through the night      Had 7 children  2 deceased . 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    works 3 days per week currently .   Glasses dentures  Neg tad    Social Determinants of Health   Financial Resource Strain:   . Difficulty of Paying Living Expenses:   Food Insecurity:   . Worried About Charity fundraiser in the Last Year:   . Arboriculturist in the Last Year:   Transportation Needs:   . Film/video editor (Medical):   [name] Lack of Transportation (Non-Medical):   Physical Activity:   . Days of Exercise per Week:   . Minutes of Exercise per Session:   Stress:   . Feeling of Stress :   Social Connections:   . Frequency of Communication with Friends and Family:   . Frequency of Social Gatherings with Friends and Family:   . Attends Religious Services:   . Active Member of Clubs or Organizations:   . Attends Archivist Meetings:   [name] Marital Status:   Intimate Partner Violence:   . Fear of Current or Ex-Partner:   . Emotionally Abused:   [name] Physically Abused:   . Sexually Abused:     Family History:   The patient's family history includes Breast cancer in her brother, mother, paternal aunt, and sister; Cancer in her sister; Colon cancer in her sister; Esophageal cancer in her daughter; Heart attack in her daughter; Hypertension in her brother, father, and mother; Lung cancer in an other family member; Rectal cancer in her brother; Stomach cancer in  her brother.    ROS:  Please see the history of present illness.  All other ROS reviewed and negative.     Physical Exam/Data:   Physical Exam: Blood pressure 122/60, pulse 74,  height 5\' 1"  (1.549 m), weight 172 lb 8 oz (78.2 kg), SpO2 96 %.  GEN:   eldely female,   NAD ,  Slightly frail  HEENT: Normal NECK: No JVD; No carotid bruits LYMPHATICS: No lymphadenopathy CARDIAC: RRR , no murmurs, rubs, gallops RESPIRATORY:  Clear to auscultation without rales, wheezing or rhonchi  ABDOMEN: Soft, non-tender, non-distended MUSCULOSKELETAL:  1+ leg edema   SKIN: Warm and dry NEUROLOGIC:  Alert and oriented x 3     EKG:    August 06, 2019: Normal sinus rhythm at 73.  No ST or T wave changes.  There is artifact affecting the chest leads.  Relevant CV Studies:   Laboratory Data:  Chemistry No results for input(s): NA, K, CL, CO2, GLUCOSE, BUN, CREATININE, CALCIUM, GFRNONAA, GFRAA, ANIONGAP in the last 168 hours.  No results for input(s): PROT, ALBUMIN, AST, ALT, ALKPHOS, BILITOT in the last 168 hours. Hematology No results for input(s): WBC, RBC, HGB, HCT, MCV, MCH, MCHC, RDW, PLT in the last 168 hours. Cardiac Enzymes No results for input(s): TROPONINI in the last 168 hours. No results for input(s): TROPIPOC in the last 168 hours.  BNPNo results for input(s): BNP, PROBNP in the last 168 hours.  DDimer No results for input(s): DDIMER in the last 168 hours.  Radiology/Studies:  No results found.  Assessment and Plan:   1.   Chronic diastolic CHF :  Mild leg edema.   Continue with current meds.  On lasix 80 bid .  She achieves a good diuresis with the Lasix.  We will continue with current medications.  2.  Hypertension: Blood pressure seems to be well controlled.  In an effort to try to improve her leg edema, will reduce the amlodipine to 5 mg a day.  She will continue to watch her blood pressure.  We may need to increase the amlodipine back up to 10 mg if her blood pressure goes up.  3.   Pulmonary HTN: She has moderate pulmonary hypertension.  We will continue treatment of her diastolic CHF.   4. Leg edema :  Has chronic diastolic congestive heart failure  and also has pulmonary hypertension.  She is also on amlodipine 10 mg a day which will tend to worsen leg edema.  5.  CKD :  Managed by primary    Signed, Mertie Moores, MD  08/06/2019 3:08 PM

## 2019-08-06 ENCOUNTER — Other Ambulatory Visit: Payer: Self-pay

## 2019-08-06 ENCOUNTER — Ambulatory Visit: Payer: Medicare Other | Admitting: Cardiovascular Disease

## 2019-08-06 ENCOUNTER — Encounter: Payer: Self-pay | Admitting: Cardiovascular Disease

## 2019-08-06 VITALS — BP 122/60 | HR 74 | Ht 61.0 in | Wt 172.5 lb

## 2019-08-06 DIAGNOSIS — I1310 Hypertensive heart and chronic kidney disease without heart failure, with stage 1 through stage 4 chronic kidney disease, or unspecified chronic kidney disease: Secondary | ICD-10-CM | POA: Diagnosis not present

## 2019-08-06 MED ORDER — AMLODIPINE BESYLATE 5 MG PO TABS: 5.0000 mg | ORAL_TABLET | Freq: Every day | ORAL | 3 refills | Status: DC

## 2019-08-06 NOTE — Patient Instructions (Signed)
Medication Instructions:  Your physician has recommended you make the following change in your medication:  1.) decrease amlodipine to 5 mg daily (see if this helps with leg swelling)  *If you need a refill on your cardiac medications before your next appointment, please call your pharmacy*   Lab Work: none If you have labs (blood work) drawn today and your tests are completely normal, you will receive your results only by: [name] MyChart Message (if you have MyChart) OR . A paper copy in the mail If you have any lab test that is abnormal or we need to change your treatment, we will call you to review the results.   Testing/Procedures: none   Follow-Up: At [facility], you and your health needs are our priority.  As part of our continuing mission to provide you with exceptional heart care, we have created designated Provider Care Teams.  These Care Teams include your primary Cardiologist (physician) and Advanced Practice Providers (APPs -  Physician Assistants and Nurse Practitioners) who all work together to provide you with the care you need, when you need it. Your next appointment:   6 month(s)  The format for your next appointment:   In Person  Provider:   You may see one of the following Advanced Practice Providers on your designated Care Team:    Richardson Dopp, PA-C  Robbie Lis, [place]     Other Instructions

## 2019-08-14 ENCOUNTER — Other Ambulatory Visit: Payer: Self-pay | Admitting: Internal Medicine

## 2019-08-20 DIAGNOSIS — I129 Hypertensive chronic kidney disease with stage 1 through stage 4 chronic kidney disease, or unspecified chronic kidney disease: Secondary | ICD-10-CM | POA: Diagnosis not present

## 2019-08-20 DIAGNOSIS — N2581 Secondary hyperparathyroidism of renal origin: Secondary | ICD-10-CM | POA: Diagnosis not present

## 2019-08-20 DIAGNOSIS — N184 Chronic kidney disease, stage 4 (severe): Secondary | ICD-10-CM | POA: Diagnosis not present

## 2019-08-20 DIAGNOSIS — N189 Chronic kidney disease, unspecified: Secondary | ICD-10-CM | POA: Diagnosis not present

## 2019-08-20 DIAGNOSIS — D631 Anemia in chronic kidney disease: Secondary | ICD-10-CM | POA: Diagnosis not present

## 2019-08-26 ENCOUNTER — Other Ambulatory Visit: Payer: Self-pay | Admitting: Internal Medicine

## 2019-08-27 ENCOUNTER — Ambulatory Visit: Payer: Medicare Other | Admitting: Physician Assistant

## 2019-09-10 ENCOUNTER — Encounter: Payer: Self-pay | Admitting: Orthopedic Surgery

## 2019-09-11 ENCOUNTER — Other Ambulatory Visit: Payer: Self-pay | Admitting: Internal Medicine

## 2019-09-12 NOTE — Telephone Encounter (Signed)
Last OV 05/21/2019, [name] scheduled on 09/19/2019  Last filled 05/21/2019, # 24 with 0 refills

## 2019-09-18 NOTE — Progress Notes (Signed)
Chief Complaint  Patient presents with  . Follow-up    Doing okay    HPI: [name] 86 y.o. comes in today for Chronic disease management med evlauation  .Since last visit.  No major changes    Asthma  Seems stable ojn advair and bid albuterol and singlulair  Her pulm specialist  No longer doing reg op  Assigned to covid team.  ? If need new practitioner .  BP cv Seems stable  bp good   Mobility no falling no need ro help  Not driving much but has abillty  No symcope  Edema no change option to do vein procedure  MSK>  Sees dr Garret Reddish has trigger finger  Middle right  Anxiety  slepp not sure citalopram helps much   Taking  Lora sosomtimes to help sleep  Not every day 1/2   Had covid vaccine and family immunized  hasnt seen hematologist   For a wheile and not sure needs to   Health Maintenance  Topic Date Due  . FOOT EXAM  Never done  . OPHTHALMOLOGY EXAM  Never done  . COVID-19 Vaccine (1) Never done  . DEXA SCAN  Never done  . HEMOGLOBIN A1C  11/18/2019  . INFLUENZA VACCINE  12/14/2019  . TETANUS/TDAP  04/03/2024  . PNA vac Low Risk Adult  Completed    ROS:  GEN/ HEENT: No fever, significant weight changes sweats headaches vision problems hearing changes, CV/ PULM; No chest pain shortness of breath cough, syncope,  change in exercise tolerance. GI /GU: No adominal pain, vomiting, change in bowel habits. No blood in the stool. No significant GU symptoms. SKIN/HEME: ,no acute skin rashes suspicious lesions or bleeding. No lymphadenopathy, nodules, masses.  NEURO/ PSYCH:  No neurologic signs such as weakness numbness. Nonew  depression anxiety. IMM/ Allergy: No unusual infections.  Allergy .   REST of 12 system review negative except as per HPI   Past Medical History:  Diagnosis Date  . Acute superficial venous thrombosis of left lower extremity 03/27/2014   concern about hx and potential extensive on exam tenderness calf and low dose lovenox 40 qd 2-4  weekselevetion and close  fu advised .   [name] Allergy   . Anemia   . Anxiety   . Arthritis    "shoulders" (09/09/2012)  . Asthma 09/08/2012  . Carotid bruit    [place] 2018  low risk 1 - 39%  . Diabetes mellitus without complication ([place])    "Borderline" per pt; being monitored.  no meds per pt  . Dyspnea 04/27/2014  . GERD (gastroesophageal reflux disease)   . Headache(784.0)    "related to my high blood pressure" (09/09/2012)  . History of DVT (deep vein thrombosis)    "RLE" (09/09/2012) coumadine cant take asa so on plavix   . HTN (hypertension) 09/08/2012  . Hyperlipidemia   . Lupus ([place])    "cured years ago" (09/09/2012)  . Other dysphagia 02/11/2013  . Syncope and collapse 01/21/2018  . Varicose veins   . Varicose veins of leg with complications 01/11/9406    Family History  Problem Relation Age of Onset  . Hypertension Mother   . Breast cancer Mother   . Hypertension Father   . Cancer Sister        Colon and Breast Cancer  . Breast cancer Sister   . Colon cancer Sister        ? 49' s dx - died in 91's  . Hypertension Brother   .  Rectal cancer Brother   . Stomach cancer Brother   . Breast cancer Brother   . Heart attack Daughter   . Esophageal cancer Daughter   . Lung cancer Other        both parents   . Breast cancer Paternal Aunt     Social History   Socioeconomic History  . Marital status: Single    Spouse name: Not on file  . Number of children: 6  . Years of education: Not on file  . Highest education level: Not on file  Occupational History  . Not on file  Tobacco Use  . Smoking status: Never Smoker  . Smokeless tobacco: Never Used  Substance and Sexual Activity  . Alcohol use: No  . Drug use: No  . Sexual activity: Not on file  Other Topics Concern  . Not on file  Social History Narrative   2 people living in the home.  Grand daughter.   Up and down through the night      Had 7 children  2 deceased . 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    works 3 days per week currently .   Glasses dentures  Neg tad    Social Determinants of Health   Financial Resource Strain:   . Difficulty of Paying Living Expenses:   Food Insecurity:   . Worried About Charity fundraiser in the Last Year:   . Arboriculturist in the Last Year:   Transportation Needs:   . Film/video editor (Medical):   [name] Lack of Transportation (Non-Medical):   Physical Activity:   . Days of Exercise per Week:   . Minutes of Exercise per Session:   Stress:   . Feeling of Stress :   Social Connections:   . Frequency of Communication with Friends and Family:   . Frequency of Social Gatherings with Friends and Family:   . Attends Religious Services:   . Active Member of Clubs or Organizations:   . Attends Archivist Meetings:   [name] Marital Status:     Outpatient Encounter Medications as of 09/19/2019  Medication Sig  . acetaminophen (TYLENOL) 500 MG tablet Take 1,000 mg by mouth every 6 (six) hours as needed for moderate pain. Reported on 04/30/2015  . albuterol (PROVENTIL) (2.5 MG/3ML) 0.083% nebulizer solution USE 1 AMPULE EVERY 6 HOURS AS NEEDED FOR WHEEZE  . albuterol (VENTOLIN HFA) 108 (90 Base) MCG/ACT inhaler USE 2 PUFFS EVERY 6 HOURS AS NEEDED FOR WHEEZING.  [name] amLODipine (NORVASC) 5 MG tablet Take 1 tablet (5 mg total) by mouth daily.  [name] atorvastatin (LIPITOR) 20 MG tablet TAKE 1 TABLET ONCE DAILY.  . carvedilol (COREG) 6.25 MG tablet Take 6.25 mg by mouth daily.  . citalopram (CELEXA) 20 MG tablet Take 0.5 tablets (10 mg total) by mouth daily. Change in dose  . clopidogrel (PLAVIX) 75 MG tablet TAKE 1 TABLET ONCE DAILY.  [name] diclofenac sodium (VOLTAREN) 1 % GEL APPLY 4GM TO AFFECTED AREA(S) 4 TIMES A DAY.  [name] doxazosin (CARDURA) 2 MG tablet TAKE 3 TABLETS AT BEDTIME.  . ferrous sulfate 325 (65 FE) MG EC tablet Take 1 tablet (325 mg total) by mouth daily with breakfast.  .  fluorometholone (FML) 0.1 % ophthalmic suspension Place 1 drop into both eyes 2 (two) times daily.   . Fluticasone-Salmeterol (ADVAIR) 250-50 MCG/DOSE AEPB USE  1 PUFF TWICE DAILY.  . furosemide (LASIX) 40 MG tablet Take 80 mg by mouth 3 (three) times daily.   . hydrALAZINE (APRESOLINE) 50 MG tablet Take 50 mg by mouth 3 (three) times daily.  [name] HYDROcodone-acetaminophen (NORCO/VICODIN) 5-325 MG tablet Take 1 tablet by mouth every 12 (twelve) hours as needed for moderate pain.  [name] lactobacillus acidophilus & bulgar (LACTINEX) chewable tablet Chew 1 tablet by mouth 3 (three) times daily with meals.  . lidocaine (LIDODERM) 5 % Place 1 patch onto the skin daily. Remove & Discard patch within 12 hours or as directed by MD  . LORazepam (ATIVAN) 1 MG tablet TAKE 1 TABLET EVERY 8 HOURS AS NEEDED FOR ANXIETY. AVOID REGULAR USE.  . montelukast (SINGULAIR) 10 MG tablet TAKE 1 TABLET ONCE DAILY.  [name] ondansetron (ZOFRAN ODT) 4 MG disintegrating tablet Take 1 tablet (4 mg total) by mouth every 8 (eight) hours as needed for nausea or vomiting.  . pantoprazole (PROTONIX) 40 MG tablet TAKE (1) TABLET TWICE A DAY BEFORE MEALS.  [name] potassium chloride (KLOR-CON) 10 MEQ tablet TAKE 4 TABLETS DAILY.  [name] RESTASIS 0.05 % ophthalmic emulsion Place 1 drop into both eyes 2 (two) times daily.   [name] Spacer/Aero-Holding Chambers (AEROCHAMBER PLUS) inhaler Use as instructed  . vitamin B-12 (CYANOCOBALAMIN) 1000 MCG tablet Take 1,000 mcg by mouth daily.  . [DISCONTINUED] citalopram (CELEXA) 20 MG tablet TAKE 1 TABLET ONCE DAILY.  . [DISCONTINUED] hydrALAZINE (APRESOLINE) 100 MG tablet Take 50 mg by mouth 3 (three) times daily.    No facility-administered encounter medications on file as of 09/19/2019.    EXAM:  BP 138/68   Pulse 62   Temp 97.7 F (36.5 C) (Temporal)   Ht 5\' 1"  (1.549 m)   Wt 173 lb 9.6 oz (78.7 kg)   SpO2 98%   BMI 32.80 kg/m   Body mass index is 32.8 kg/m.  Physical Exam: Vital signs reviewed VOH:YWVP  is a well-developed well-nourished alert cooperative   who appears stated age in no acute distress.  HEENT: normocephalic atraumatic ,,., . Mouth: clear OP,masked NECK: supple without masses, thyromegaly or bruits. CHEST/PULM:  Clear to auscultation and percussion breath sounds equal no wheeze , rales or rhonchi. No chest wall deformities or tenderness. CV: PMI is nondisplaced, S1 S2 no gallops, murmurs, rubs. Peripheral pulses are full without delay.No JVD .  ABDOMEN: Bowel sounds normal nontender  No guard or rebound, no hepato splenomegal no CVA tenderness.  Mild tender ruq no masses  Extremtities:  No clubbing cyanosis in compression , no acute joint swelling or redness no focal atrophy r r middle trigger finger  NEURO:  Oriented x3, cranial nerves 3-12 appear to be intact, no obvious focal weakness,gait within normal limits steady able to get up on exam talebe and down without help  SKIN: No acute rashes normal turgor, color, no bruising or petechiae. PSYCH: Oriented, good eye contact, no obvious depression anxiety, cognition and judgment appear normal. LN: no cervical adenopathy No noted deficits in memory, attention, and speech.   Lab Results  Component Value Date   WBC 4.1 05/21/2019   HGB 9.9 (L) 05/21/2019   HCT 30.2 (L) 05/21/2019   PLT 177.0 05/21/2019   GLUCOSE 122 (H) 05/21/2019   CHOL 177 03/25/2018   TRIG 72.0 03/25/2018   HDL 73.60 03/25/2018   LDLCALC 89 03/25/2018   ALT 7 11/02/2018   AST 18 11/02/2018   NA 141 05/21/2019   K 4.0 05/21/2019   CL 102  05/21/2019   CREATININE 2.57 (H) 05/21/2019   BUN 49 (H) 05/21/2019   CO2 29 05/21/2019   TSH 1.31 07/09/2017   INR 0.94 12/22/2009   HGBA1C 5.9 05/21/2019   MICROALBUR 10.7 (H) 04/25/2016   Cereal this am  Cheerios .     ASSESSMENT AND PLAN:  Discussed the following assessment and plan:  Medication management - Plan: Basic metabolic panel, CBC with Differential/Platelet, Hemoglobin A1c, Lipid panel, Hepatic  function panel, TSH  Anemia of chronic disease - Plan: Basic metabolic panel, CBC with Differential/Platelet, Hemoglobin A1c, Lipid panel, Hepatic function panel, TSH  Chronic kidney disease (CKD), stage IV (severe) (HCC) - Plan: Basic metabolic panel, CBC with Differential/Platelet, Hemoglobin A1c, Lipid panel, Hepatic function panel, TSH  Fasting hyperglycemia - Plan: Basic metabolic panel, CBC with Differential/Platelet, Hemoglobin A1c, Lipid panel, Hepatic function panel, TSH  Essential hypertension - Plan: Basic metabolic panel, CBC with Differential/Platelet, Hemoglobin A1c, Lipid panel, Hepatic function panel, TSH  Asthma, chronic, unspecified asthma severity, with acute exacerbation - Plan: Ambulatory referral to Pulmonology  Pulmonary hypertension ([place]) - Plan: Ambulatory referral to Pulmonologyhyperglycemia but no dm  Based on current situation pulm status appears to currently be stable on meds   Work on  New pulm because of her complex conditions  Says not sure citalopram helps" anything" and not taking that reg for now dec to 10 mg per day and call for 10 mg tabs   If needed refill  I suspect helping some   As she is doing fairly well.  Limiting benzos  Patient Care Team: Khrystal Jeanmarie, Standley Brooking, MD as PCP - General (Internal Medicine) Nahser, Wonda Cheng, MD as PCP - Cardiology (Cardiology) Marlou Sa Tonna Corner, MD (Orthopedic Surgery) Truitt Merle, MD as Consulting Physician (Hematology) Madelon Lips, MD as Consulting Physician (Nephrology) Monna Fam, MD as Consulting Physician (Ophthalmology)  Patient Instructions  Will notify you  of labs when available.  Will share results with  Dr Hollie Salk and other team .   Try decrease the citalopram to 10 mg per day works best if taken every day . For anxiety suppression.   Will review  And try to help getting you a new pulmonary specialist  But no changes at this time .    Standley Brooking. Sheretta Grumbine M.D.

## 2019-09-19 ENCOUNTER — Ambulatory Visit (INDEPENDENT_AMBULATORY_CARE_PROVIDER_SITE_OTHER): Payer: Medicare Other | Admitting: Internal Medicine

## 2019-09-19 ENCOUNTER — Other Ambulatory Visit: Payer: Self-pay

## 2019-09-19 ENCOUNTER — Encounter: Payer: Self-pay | Admitting: Internal Medicine

## 2019-09-19 VITALS — BP 138/68 | HR 62 | Temp 97.7°F | Ht 61.0 in | Wt 173.6 lb

## 2019-09-19 DIAGNOSIS — N184 Chronic kidney disease, stage 4 (severe): Secondary | ICD-10-CM | POA: Diagnosis not present

## 2019-09-19 DIAGNOSIS — D638 Anemia in other chronic diseases classified elsewhere: Secondary | ICD-10-CM | POA: Diagnosis not present

## 2019-09-19 DIAGNOSIS — I10 Essential (primary) hypertension: Secondary | ICD-10-CM | POA: Diagnosis not present

## 2019-09-19 DIAGNOSIS — R7301 Impaired fasting glucose: Secondary | ICD-10-CM | POA: Diagnosis not present

## 2019-09-19 DIAGNOSIS — Z79899 Other long term (current) drug therapy: Secondary | ICD-10-CM

## 2019-09-19 DIAGNOSIS — I272 Pulmonary hypertension, unspecified: Secondary | ICD-10-CM

## 2019-09-19 DIAGNOSIS — J45901 Unspecified asthma with (acute) exacerbation: Secondary | ICD-10-CM

## 2019-09-19 LAB — LIPID PANEL
Cholesterol: 174 mg/dL (ref 0–200)
HDL: 57.5 mg/dL (ref >39.00)
LDL Cholesterol: 97 mg/dL (ref 0–99)
NonHDL: 116.98
Total CHOL/HDL Ratio: 3
Triglycerides: 100 mg/dL (ref 0.0–149.0)
VLDL: 20 mg/dL (ref 0.0–40.0)

## 2019-09-19 LAB — BASIC METABOLIC PANEL
BUN: 44 mg/dL — ABNORMAL HIGH (ref 6–23)
CO2: 33 mEq/L — ABNORMAL HIGH (ref 19–32)
Calcium: 9.4 mg/dL (ref 8.4–10.5)
Chloride: 100 mEq/L (ref 96–112)
Creatinine, Ser: 2.34 mg/dL — ABNORMAL HIGH (ref 0.40–1.20)
GFR: 23.98 mL/min — ABNORMAL LOW (ref >60.00)
Glucose, Bld: 93 mg/dL (ref 70–99)
Potassium: 4.1 mEq/L (ref 3.5–5.1)
Sodium: 140 mEq/L (ref 135–145)

## 2019-09-19 LAB — CBC WITH DIFFERENTIAL/PLATELET
Basophils Absolute: 0 10*3/uL (ref 0.0–0.1)
Basophils Relative: 0.8 % (ref 0.0–3.0)
Eosinophils Absolute: 0.1 10*3/uL (ref 0.0–0.7)
Eosinophils Relative: 1.8 % (ref 0.0–5.0)
HCT: 30.1 % — ABNORMAL LOW (ref 36.0–46.0)
Hemoglobin: 9.8 g/dL — ABNORMAL LOW (ref 12.0–15.0)
Lymphocytes Relative: 21.8 % (ref 12.0–46.0)
Lymphs Abs: 1 10*3/uL (ref 0.7–4.0)
MCHC: 32.7 g/dL (ref 30.0–36.0)
MCV: 87.5 fl (ref 78.0–100.0)
Monocytes Absolute: 0.3 10*3/uL (ref 0.1–1.0)
Monocytes Relative: 7.3 % (ref 3.0–12.0)
Neutro Abs: 3.2 10*3/uL (ref 1.4–7.7)
Neutrophils Relative %: 68.3 % (ref 43.0–77.0)
Platelets: 182 10*3/uL (ref 150.0–400.0)
RBC: 3.44 Mil/uL — ABNORMAL LOW (ref 3.87–5.11)
RDW: 13.3 % (ref 11.5–15.5)
WBC: 4.7 10*3/uL (ref 4.0–10.5)

## 2019-09-19 LAB — HEPATIC FUNCTION PANEL
ALT: 6 U/L (ref 0–35)
AST: 16 U/L (ref 0–37)
Albumin: 4 g/dL (ref 3.5–5.2)
Alkaline Phosphatase: 51 U/L (ref 39–117)
Bilirubin, Direct: 0.1 mg/dL (ref 0.0–0.3)
Total Bilirubin: 0.4 mg/dL (ref 0.2–1.2)
Total Protein: 6.8 g/dL (ref 6.0–8.3)

## 2019-09-19 LAB — HEMOGLOBIN A1C: Hgb A1c MFr Bld: 5.8 % (ref 4.6–6.5)

## 2019-09-19 LAB — TSH: TSH: 2.34 u[IU]/mL (ref 0.35–4.50)

## 2019-09-19 NOTE — Patient Instructions (Signed)
Will notify you  of labs when available.  Will share results with  Dr Hollie Salk and other team .   Try decrease the citalopram to 10 mg per day works best if taken every day . For anxiety suppression.   Will review  And try to help getting you a new pulmonary specialist  But no changes at this time .

## 2019-09-22 ENCOUNTER — Other Ambulatory Visit: Payer: Self-pay

## 2019-09-22 ENCOUNTER — Ambulatory Visit: Payer: Medicare Other | Admitting: Surgical

## 2019-09-22 ENCOUNTER — Encounter: Payer: Self-pay | Admitting: Surgical

## 2019-09-22 DIAGNOSIS — M65331 Trigger finger, right middle finger: Secondary | ICD-10-CM

## 2019-09-22 DIAGNOSIS — M79605 Pain in left leg: Secondary | ICD-10-CM

## 2019-09-22 DIAGNOSIS — M5410 Radiculopathy, site unspecified: Secondary | ICD-10-CM | POA: Diagnosis not present

## 2019-09-22 MED ORDER — ACETAMINOPHEN-CODEINE #3 300-30 MG PO TABS: 1.0000 | ORAL_TABLET | Freq: Every day | ORAL | 0 refills | Status: DC | PRN

## 2019-09-22 MED ORDER — PREDNISONE 10 MG (21) PO TBPK
ORAL_TABLET | ORAL | 0 refills | Status: DC
Start: 2019-09-22 — End: 2019-10-20

## 2019-09-22 NOTE — Progress Notes (Signed)
Office Visit Note   Patient: [name]           Date of Birth: [birth year]           MRN: [id number] Visit Date: 09/22/2019 Requested by: Burnis Medin, MD [place],  [place] [address] PCP: Burnis Medin, MD  Subjective: Chief Complaint  Patient presents with  . Right Hand - Pain  . Left Leg - Pain    HPI: [name] is a 86 y.o. female who presents to the office complaining of right hand middle finger pain.  Patient complains of several months of her middle finger locking in a flexed position.  She states that she has had to reduce the lock middle finger with the other hand at times.  She is left-hand dominant.  She does have a history of 2 previous injections in the same finger for trigger finger.  She denies any surgery on this finger.  Patient also reports low back pain with left leg pain that travels down the lateral aspect of her left leg.  She notes occasional numbness and tingling in the leg.  She has some subjective weakness in the left leg.  She denies any red flag symptoms such as bowel/bladder incontinence or saddle anesthesia.  She has no history of back surgery.  She does have a history of ESI's based on her MRI scan of her lumbar spine in July 2017.  These ESI's have provided good relief in the past.  This feels similar to the past symptoms she is experienced prior to receiving ESI's.  She is on Plavix.  She is taking Tylenol with some relief.              ROS:  All systems reviewed are negative as they relate to the chief complaint within the history of present illness.  Patient denies fevers or chills.  Assessment & Plan: Visit Diagnoses:  1. Trigger finger, right middle finger   2. Pain in left leg   3. Radicular syndrome of left leg     Plan: Patient is a 86 year old female who presents complaining of right middle finger pain and locking as well as low back pain with radicular left leg pain.  Impression is right middle finger  trigger finger.  She has had 2 previous injections in the right middle finger for trigger finger.  Due to this, with increased chance of tendon attenuation and rupture, no injection will be done today.  Discussed options available to patient.  She may either live with the pain and symptoms and try some anti-inflammatory medication topically or consider surgery.  Patient will try topical Voltaren gel.  Follow-up in 4 weeks for clinical recheck.  Regarding her low back pain and left leg radicular symptoms.  These feel similar to the previous symptoms she experienced prior to receiving her last ESI's.  Is causing her significant distress.  She has no frank weakness on exam.  She is on Plavix.  Plan to refer patient to Dr. Ernestina Patches for L spine ESI's.  In the meantime prescribed short course of Tylenol 3 as well as a steroid Dosepak to help with symptom.  Follow-Up Instructions: No follow-ups on file.   Orders:  Orders Placed This Encounter  Procedures  . Ambulatory referral to Physical Medicine Rehab   Meds ordered this encounter  Medications  . acetaminophen-codeine (TYLENOL #3) 300-30 MG tablet    Sig: Take 1 tablet by mouth daily as needed for moderate  pain.    Dispense:  20 tablet    Refill:  0  . predniSONE (STERAPRED UNI-PAK 21 TAB) 10 MG (21) TBPK tablet    Sig: Take medication as directed on package    Dispense:  21 tablet    Refill:  0      Procedures: No procedures performed   Clinical Data: No additional findings.  Objective: Vital Signs: There were no vitals taken for this visit.  Physical Exam:  Constitutional: Patient appears well-developed HEENT:  Head: Normocephalic Eyes:EOM are normal Neck: Normal range of motion Cardiovascular: Normal rate Pulmonary/chest: Effort normal Neurologic: Patient is alert Skin: Skin is warm Psychiatric: Patient has normal mood and affect  Ortho Exam:  Tenderness to palpation over the A1 pulley of the right middle finger.  No  tenderness to palpation over the other A1 pulleys of the finger.  A full range of motion of the right middle finger with pain elicited at the base of the right middle finger.  No pain with wrist flexion or extension.  No significant tenderness to palpation over the first compartment, anatomic snuffbox, first CMC joint, scaphoid tubercle.  5/5 motor strength of the bilateral hip flexors, quadriceps, hamstring, dorsiflexion, plantarflexion.  Sensation intact but diminished throughout all dermatomes of the left lower extremity compared to the right lower extremity.  Tender to palpation throughout the axial lumbar spine and the left-sided paraspinal musculature.  Specialty Comments:  No specialty comments available.  Imaging: No results found.   PMFS History: Patient Active Problem List   Diagnosis Date Noted  . Nausea 11/03/2018  . Elevated troponin 11/03/2018  . Hyponatremia 11/03/2018  . Hypertensive heart disease with hypertensive chronic kidney disease 11/30/2017  . Fasting hyperglycemia 11/30/2017  . Renal cyst 03/27/2016  . CKD (chronic kidney disease) stage 3, GFR 30-59 ml/min (HCC) 08/23/2015  . Perceived hearing changes 06/29/2014  . Resistant hypertension 06/29/2014  . Lumbago 06/15/2014  . Pre-diabetes vs early DM  06/15/2014  . Dyspnea 04/27/2014  . Asthma, chronic 04/13/2014  . Elevated uric acid in blood 04/13/2014  . Essential hypertension 03/27/2014  . History of DVT (deep vein thrombosis)   . Palpitations 01/05/2014  . Anemia of chronic disease January 14, 2014  . Death of family member 09-18-13  . Leg edema 06/20/2013  . Bereavement due to life event 04/21/2013  . Other dysphagia 02/11/2013  . Colon cancer screening 02/11/2013  . Anxiety state 01/20/2013  . Leg cramps 01/20/2013  . Back pain 12/05/2012  . Family hx of colon cancer 10/21/2012  . Family hx of lung cancer 10/21/2012  . Family hx-breast malignancy 10/21/2012  . Renal insufficiency creatinine 1.3  10/21/2012  . Anemia, chronic disease 10/21/2012  . GERD (gastroesophageal reflux disease) 09/08/2012  . HTN (hypertension) 09/08/2012  . Hyperlipidemia 09/08/2012  . Asthma 09/08/2012  . Varicose veins of leg with complications 85/88/5027   Past Medical History:  Diagnosis Date  . Acute superficial venous thrombosis of left lower extremity 03/27/2014   concern about hx and potential extensive on exam tenderness calf and low dose lovenox 40 qd 2-4 weekselevetion and close  fu advised .   [name] Allergy   . Anemia   . Anxiety   . Arthritis    "shoulders" (09/09/2012)  . Asthma 09/08/2012  . Carotid bruit    [place] 2018  low risk 1 - 39%  . Diabetes mellitus without complication ([place])    "Borderline" per pt; being monitored.  no meds per pt  . Dyspnea 04/27/2014  .  GERD (gastroesophageal reflux disease)   . Headache(784.0)    "related to my high blood pressure" (09/09/2012)  . History of DVT (deep vein thrombosis)    "RLE" (09/09/2012) coumadine cant take asa so on plavix   . HTN (hypertension) 09/08/2012  . Hyperlipidemia   . Lupus ([place])    "cured years ago" (09/09/2012)  . Other dysphagia 02/11/2013  . Syncope and collapse 01/21/2018  . Varicose veins   . Varicose veins of leg with complications 0/01/3817    Family History  Problem Relation Age of Onset  . Hypertension Mother   . Breast cancer Mother   . Hypertension Father   . Cancer Sister        Colon and Breast Cancer  . Breast cancer Sister   . Colon cancer Sister        ? 76' s dx - died in 27's  . Hypertension Brother   . Rectal cancer Brother   . Stomach cancer Brother   . Breast cancer Brother   . Heart attack Daughter   . Esophageal cancer Daughter   . Lung cancer Other        both parents   . Breast cancer Paternal Aunt     Past Surgical History:  Procedure Laterality Date  . ABDOMINAL HYSTERECTOMY     partial  . BREAST EXCISIONAL BIOPSY Right   . BREAST SURGERY    . CARPAL TUNNEL RELEASE Right 2000's  .  CARPAL TUNNEL RELEASE Bilateral 10/21/2013   Procedure: BILATERAL  CARPAL TUNNEL RELEASE;  Surgeon: [name], MD;  Location: [facility];  Service: Orthopedics;  Laterality: Bilateral;  . SHOULDER OPEN ROTATOR CUFF REPAIR Bilateral 2000's  . TRIGGER FINGER RELEASE Right 10/21/2013   Procedure: RELEASE TRIGGER FINGER/A-1 PULLEY RIGHT MIDDLE AND RIGHT RING;  Surgeon: [name], MD;  Location: [facility];  Service: Orthopedics;  Laterality: Right;   Social History   Occupational History  . Not on file  Tobacco Use  . Smoking status: Never Smoker  . Smokeless tobacco: Never Used  Substance and Sexual Activity  . Alcohol use: No  . Drug use: No  . Sexual activity: Not on file

## 2019-09-23 ENCOUNTER — Telehealth: Payer: Self-pay | Admitting: *Deleted

## 2019-09-23 NOTE — Telephone Encounter (Signed)
I think should be ok  But also sending to cardiologist for confirmation approval

## 2019-09-23 NOTE — Progress Notes (Signed)
Creatinine still in the 2 range  , cholesterol and liver is good , anemia about the same   blood sugar and thyroid are good . ( no diabetes)

## 2019-09-24 NOTE — Telephone Encounter (Signed)
Pt is scheduled for 10/22/19, pt granddaughter agreed and understood to hold BT for pt 7 days prior to appt. Pt will have granddaughter to drive her to appt.

## 2019-09-24 NOTE — Telephone Encounter (Signed)
OK to hold Plavix for 7 days prior to surgery

## 2019-09-29 ENCOUNTER — Other Ambulatory Visit: Payer: Self-pay | Admitting: Internal Medicine

## 2019-10-01 ENCOUNTER — Other Ambulatory Visit: Payer: Self-pay

## 2019-10-01 MED ORDER — CITALOPRAM HYDROBROMIDE 20 MG PO TABS: 10.0000 mg | ORAL_TABLET | Freq: Every day | ORAL | 0 refills | Status: DC

## 2019-10-15 ENCOUNTER — Telehealth: Payer: Self-pay | Admitting: Internal Medicine

## 2019-10-15 NOTE — Progress Notes (Signed)
  Chronic Care Management   Outreach Note  10/15/2019 Name: Roda Lauture MRN: [id number] DOB: 11/07/1933  Referred by: Burnis Medin, MD Reason for referral : No chief complaint on file.   An unsuccessful telephone outreach was attempted today. The patient was referred to the pharmacist for assistance with care management and care coordination.   Follow Up Plan:   Prathima Ghanta Upstream Scheduler

## 2019-10-20 ENCOUNTER — Encounter: Payer: Self-pay | Admitting: Emergency Medicine

## 2019-10-20 ENCOUNTER — Ambulatory Visit: Payer: Medicare Other | Admitting: Emergency Medicine

## 2019-10-20 ENCOUNTER — Other Ambulatory Visit: Payer: Self-pay

## 2019-10-20 DIAGNOSIS — J45909 Unspecified asthma, uncomplicated: Secondary | ICD-10-CM

## 2019-10-20 MED ORDER — FLUTICASONE PROPIONATE 50 MCG/ACT NA SUSP
2.0000 | Freq: Every day | NASAL | 2 refills | Status: DC
Start: 2019-10-20 — End: 2022-12-08

## 2019-10-20 MED ORDER — LORATADINE 10 MG PO TABS
10.0000 mg | ORAL_TABLET | Freq: Every day | ORAL | 11 refills | Status: DC
Start: 2019-10-20 — End: 2021-05-17

## 2019-10-20 MED ORDER — BREZTRI AEROSPHERE 160-9-4.8 MCG/ACT IN AERO
2.0000 | INHALATION_SPRAY | Freq: Two times a day (BID) | RESPIRATORY_TRACT | 0 refills | Status: DC
Start: 2019-10-20 — End: 2019-11-10

## 2019-10-20 NOTE — Patient Instructions (Addendum)
Temporarily stop Advair We will start Breztri 2 puffs twice a day.  Rinse and gargle after use this medication. You can continue to use your albuterol, either 1 nebulizer treatment or 2 puffs, up to every 4 hours if needed for shortness of breath, chest tightness, wheezing. Please start fluticasone 2 sprays each nostril once daily. Please start loratadine (Claritin) 10 mg once daily until next visit. Continue your Singulair 10 mg each evening Continue pantoprazole 40 mg twice a day.  Take this medication 1 hour around Follow with Dr Lamonte Sakai in 1 month or next available to review your status on the new medications.

## 2019-10-20 NOTE — Progress Notes (Signed)
Subjective:    Patient ID: [name], female    DOB: Jun 27, 1933, 86 y.o.   MRN: [id number]  HPI 86 year old woman, never smoker with history of diabetes, right lower extremity DVT, hypertension, question SLE (?  Resolved), allergic rhinitis, followed previously in our office for moderate persistent asthma and secondary pulmonary hypertension. She had childhood asthma, has been on medication much of her life. She used to be on daily prednisone many years ago. She flares and requires pred occasionally - not in the last year.   She has been noticing intermittent wheeze, sometimes associated with exertion but not always. Can be worse in the am. Infrequent cough. She is using albuterol most mornings and evenings. Uses albuterol HFA most days 1-2x a day.    Echocardiogram 11/14/2018 reviewed by me, shows normal LV function with diastolic dysfunction, normal RV function with moderately elevated RVSP  VQ scan 10/15/2017 low probability for PE  Pulmonary function testing 11/26/2017 reviewed by me, shows moderate severe obstruction with a borderline bronchodilator response, coexisting restriction on volumes and a decreased diffusion capacity that corrects to the normal range when adjusted for alveolar volume  Home sleep study 6-25/19 showed an AHI of 0.8/h  Review of Systems As per Hpi  Past Medical History:  Diagnosis Date  . Acute superficial venous thrombosis of left lower extremity 03/27/2014   concern about hx and potential extensive on exam tenderness calf and low dose lovenox 40 qd 2-4 weekselevetion and close  fu advised .   [name] Allergy   . Anemia   . Anxiety   . Arthritis    "shoulders" (09/09/2012)  . Asthma 09/08/2012  . Carotid bruit    [place] 2018  low risk 1 - 39%  . Diabetes mellitus without complication ([place])    "Borderline" per pt; being monitored.  no meds per pt  . Dyspnea 04/27/2014  . GERD (gastroesophageal reflux disease)   . Headache(784.0)    "related to my high blood  pressure" (09/09/2012)  . History of DVT (deep vein thrombosis)    "RLE" (09/09/2012) coumadine cant take asa so on plavix   . HTN (hypertension) 09/08/2012  . Hyperlipidemia   . Lupus ([place])    "cured years ago" (09/09/2012)  . Other dysphagia 02/11/2013  . Syncope and collapse 01/21/2018  . Varicose veins   . Varicose veins of leg with complications 08/30/4079     Family History  Problem Relation Age of Onset  . Hypertension Mother   . Breast cancer Mother   . Hypertension Father   . Cancer Sister        Colon and Breast Cancer  . Breast cancer Sister   . Colon cancer Sister        ? 21' s dx - died in 5's  . Hypertension Brother   . Rectal cancer Brother   . Stomach cancer Brother   . Breast cancer Brother   . Heart attack Daughter   . Esophageal cancer Daughter   . Lung cancer Other        both parents   . Breast cancer Paternal Aunt      Social History   Socioeconomic History  . Marital status: Single    Spouse name: Not on file  . Number of children: 6  . Years of education: Not on file  . Highest education level: Not on file  Occupational History  . Not on file  Tobacco Use  . Smoking status: Never Smoker  . Smokeless  tobacco: Never Used  Substance and Sexual Activity  . Alcohol use: No  . Drug use: No  . Sexual activity: Not on file  Other Topics Concern  . Not on file  Social History Narrative   2 people living in the home.  Grand daughter.   Up and down through the night      Had 7 children  2 deceased . 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    works 3 days per week currently .   Glasses dentures  Neg tad    Social Determinants of Health   Financial Resource Strain:   . Difficulty of Paying Living Expenses:   Food Insecurity:   . Worried About Charity fundraiser in the Last Year:   . Arboriculturist in the Last Year:   Transportation Needs:   . Film/video editor  (Medical):   [name] Lack of Transportation (Non-Medical):   Physical Activity:   . Days of Exercise per Week:   . Minutes of Exercise per Session:   Stress:   . Feeling of Stress :   Social Connections:   . Frequency of Communication with Friends and Family:   . Frequency of Social Gatherings with Friends and Family:   . Attends Religious Services:   . Active Member of Clubs or Organizations:   . Attends Archivist Meetings:   [name] Marital Status:   Intimate Partner Violence:   . Fear of Current or Ex-Partner:   . Emotionally Abused:   [name] Physically Abused:   . Sexually Abused:      Allergies  Allergen Reactions  . Penicillins Nausea And Vomiting and Other (See Comments)    Has patient had a PCN reaction causing immediate rash, facial/tongue/throat swelling, SOB or lightheadedness with hypotension: Y Has patient had a PCN reaction causing severe rash involving mucus membranes or skin necrosis: Y Has patient had a PCN reaction that required hospitalization: N Has patient had a PCN reaction occurring within the last 10 years: N If all of the above answers are "NO", then may proceed with Cephalosporin use.   . Aspirin Other (See Comments)    Wheezing Acetaminophen is OK     Outpatient Medications Prior to Visit  Medication Sig Dispense Refill  . acetaminophen (TYLENOL) 500 MG tablet Take 1,000 mg by mouth every 6 (six) hours as needed for moderate pain. Reported on 04/30/2015    . albuterol (PROVENTIL) (2.5 MG/3ML) 0.083% nebulizer solution USE 1 AMPULE EVERY 6 HOURS AS NEEDED FOR WHEEZE 75 mL 0  . albuterol (VENTOLIN HFA) 108 (90 Base) MCG/ACT inhaler USE 2 PUFFS EVERY 6 HOURS AS NEEDED FOR WHEEZING. 8.5 g 3  . amLODipine (NORVASC) 5 MG tablet Take 1 tablet (5 mg total) by mouth daily. 90 tablet 3  . atorvastatin (LIPITOR) 20 MG tablet TAKE 1 TABLET ONCE DAILY. 90 tablet 0  . carvedilol (COREG) 6.25 MG tablet Take 6.25 mg by mouth daily.    . citalopram (CELEXA) 20 MG tablet  Take 0.5 tablets (10 mg total) by mouth daily. Change in dose 15 tablet 0  . clopidogrel (PLAVIX) 75 MG tablet TAKE 1 TABLET ONCE DAILY. 30 tablet 0  . diclofenac sodium (VOLTAREN) 1 % GEL APPLY 4GM TO AFFECTED AREA(S) 4 TIMES A DAY. 100 g 0  . doxazosin (CARDURA) 2 MG tablet TAKE 3 TABLETS AT BEDTIME. 90 tablet  0  . ferrous sulfate 325 (65 FE) MG EC tablet Take 1 tablet (325 mg total) by mouth daily with breakfast. 30 tablet 3  . fluorometholone (FML) 0.1 % ophthalmic suspension Place 1 drop into both eyes 2 (two) times daily.     . Fluticasone-Salmeterol (ADVAIR) 250-50 MCG/DOSE AEPB USE 1 PUFF TWICE DAILY. 60 each 6  . furosemide (LASIX) 40 MG tablet Take 80 mg by mouth 3 (three) times daily.     . hydrALAZINE (APRESOLINE) 50 MG tablet Take 50 mg by mouth 3 (three) times daily.    [name] HYDROcodone-acetaminophen (NORCO/VICODIN) 5-325 MG tablet Take 1 tablet by mouth every 12 (twelve) hours as needed for moderate pain. 14 tablet 0  . lactobacillus acidophilus & bulgar (LACTINEX) chewable tablet Chew 1 tablet by mouth 3 (three) times daily with meals. 30 tablet 0  . lidocaine (LIDODERM) 5 % Place 1 patch onto the skin daily. Remove & Discard patch within 12 hours or as directed by MD 30 patch 0  . LORazepam (ATIVAN) 1 MG tablet TAKE 1 TABLET EVERY 8 HOURS AS NEEDED FOR ANXIETY. AVOID REGULAR USE. 24 tablet 0  . montelukast (SINGULAIR) 10 MG tablet TAKE 1 TABLET ONCE DAILY. 90 tablet 0  . ondansetron (ZOFRAN ODT) 4 MG disintegrating tablet Take 1 tablet (4 mg total) by mouth every 8 (eight) hours as needed for nausea or vomiting. 20 tablet 0  . pantoprazole (PROTONIX) 40 MG tablet TAKE (1) TABLET TWICE A DAY BEFORE MEALS. 180 tablet 3  . potassium chloride (KLOR-CON) 10 MEQ tablet TAKE 4 TABLETS DAILY. 120 tablet 0  . RESTASIS 0.05 % ophthalmic emulsion Place 1 drop into both eyes 2 (two) times daily.     . vitamin B-12 (CYANOCOBALAMIN) 1000 MCG tablet Take 1,000 mcg by mouth daily.    [name]  acetaminophen-codeine (TYLENOL #3) 300-30 MG tablet Take 1 tablet by mouth daily as needed for moderate pain. (Patient not taking: Reported on 10/20/2019) 20 tablet 0  . Spacer/Aero-Holding Chambers (AEROCHAMBER PLUS) inhaler Use as instructed (Patient not taking: Reported on 10/20/2019) 1 each 2  . predniSONE (STERAPRED UNI-PAK 21 TAB) 10 MG (21) TBPK tablet Take medication as directed on package 21 tablet 0   No facility-administered medications prior to visit.        Objective:   Physical Exam Vitals:   10/20/19 1156  BP: 124/60  Pulse: 71  Temp: 98.4 F (36.9 C)  TempSrc: Oral  SpO2: 100%  Weight: 171 lb 12.8 oz (77.9 kg)  Height: 5\' 1"  (1.549 m)   Gen: Pleasant, overweight, in no distress,  normal affect  ENT: No lesions,  mouth clear,  oropharynx clear, no postnasal drip  Neck: No JVD, exp stridor noted  Lungs: No use of accessory muscles, small breaths, some referred UA noise, no crackles or wheezing on normal respiration  Cardiovascular: RRR, heart sounds normal, no murmur or gallops, no peripheral edema  Musculoskeletal: No deformities, no cyanosis or clubbing  Neuro: alert, awake, non focal  Skin: Warm, no lesions or rash       Assessment & Plan:  Asthma, chronic Persistent symptoms, frequent albuterol use.  Based on exam some of her wheezing and symptoms may be upper airway in nature.  She probably benefit from getting off a powdered inhaler.  She also benefit from treatment of exacerbating factors including allergic rhinitis which she is having.  We will add to her allergy therapy as below.  I will change her Advair to [employer].  Continue  GERD therapy.  Temporarily stop Advair We will start Breztri 2 puffs twice a day.  Rinse and gargle after use this medication. You can continue to use your albuterol, either 1 nebulizer treatment or 2 puffs, up to every 4 hours if needed for shortness of breath, chest tightness, wheezing. Please start fluticasone 2 sprays each  nostril once daily. Please start loratadine (Claritin) 10 mg once daily until next visit. Continue your Singulair 10 mg each evening Continue pantoprazole 40 mg twice a day.  Take this medication 1 hour around Follow with Dr Lamonte Sakai in 1 month or next available to review your status on the new medications.   Baltazar Apo, MD, PhD 10/20/2019, 12:31 PM [facility] [phone] or if no answer [phone]

## 2019-10-20 NOTE — Assessment & Plan Note (Signed)
Persistent symptoms, frequent albuterol use.  Based on exam some of her wheezing and symptoms may be upper airway in nature.  She probably benefit from getting off a powdered inhaler.  She also benefit from treatment of exacerbating factors including allergic rhinitis which she is having.  We will add to her allergy therapy as below.  I will change her Advair to [employer].  Continue GERD therapy.  Temporarily stop Advair We will start Breztri 2 puffs twice a day.  Rinse and gargle after use this medication. You can continue to use your albuterol, either 1 nebulizer treatment or 2 puffs, up to every 4 hours if needed for shortness of breath, chest tightness, wheezing. Please start fluticasone 2 sprays each nostril once daily. Please start loratadine (Claritin) 10 mg once daily until next visit. Continue your Singulair 10 mg each evening Continue pantoprazole 40 mg twice a day.  Take this medication 1 hour around Follow with Dr Lamonte Sakai in 1 month or next available to review your status on the new medications.

## 2019-10-20 NOTE — Addendum Note (Signed)
Addended by: Gavin Potters R on: 10/20/2019 12:50 PM   Modules accepted: Orders

## 2019-10-22 ENCOUNTER — Other Ambulatory Visit: Payer: Self-pay

## 2019-10-22 ENCOUNTER — Ambulatory Visit: Payer: Medicare Other | Admitting: Physical Medicine and Rehabilitation

## 2019-10-22 ENCOUNTER — Ambulatory Visit: Payer: Medicare Other | Admitting: Orthopedic Surgery

## 2019-10-22 ENCOUNTER — Encounter: Payer: Self-pay | Admitting: Physical Medicine and Rehabilitation

## 2019-10-22 ENCOUNTER — Ambulatory Visit: Payer: Self-pay

## 2019-10-22 VITALS — BP 140/73 | HR 82

## 2019-10-22 DIAGNOSIS — M5416 Radiculopathy, lumbar region: Secondary | ICD-10-CM | POA: Diagnosis not present

## 2019-10-22 MED ORDER — METHYLPREDNISOLONE ACETATE 80 MG/ML IJ SUSP
40.0000 mg | Freq: Once | INTRAMUSCULAR | Status: AC
  Administered 2019-10-22: 40 mg

## 2019-10-22 NOTE — Progress Notes (Signed)
Pt states pain in the left side of the lower back that radiates into the left leg all the way down. Pt states pain started years ago and has gotten worse in the past few months. Pt states walking and standing makes pain worse. Tylenol helps with pain.   .Numeric Pain Rating Scale and Functional Assessment Average Pain 9   In the last MONTH (on 0-10 scale) has pain interfered with the following?  1. General activity like being  able to carry out your everyday physical activities such as walking, climbing stairs, carrying groceries, or moving a chair?  Rating(9)   +Driver, +BT(plavix stopped 10/13/19) , -Dye Allergies.

## 2019-10-27 ENCOUNTER — Other Ambulatory Visit: Payer: Self-pay | Admitting: Internal Medicine

## 2019-10-27 DIAGNOSIS — Z1231 Encounter for screening mammogram for malignant neoplasm of breast: Secondary | ICD-10-CM

## 2019-10-28 NOTE — Procedures (Signed)
Lumbar Epidural Steroid Injection - Interlaminar Approach with Fluoroscopic Guidance  Patient: [name]      Date of Birth: Jan 24, 1934 MRN: [id number] PCP: Burnis Medin, MD      Visit Date: 10/22/2019   Universal Protocol:     Consent Given By: the patient  Position: PRONE  Additional Comments: Vital signs were monitored before and after the procedure. Patient was prepped and draped in the usual sterile fashion. The correct patient, procedure, and site was verified.   Injection Procedure Details:  Procedure Site One Meds Administered:  Meds ordered this encounter  Medications  . methylPREDNISolone acetate (DEPO-MEDROL) injection 40 mg     Laterality: Left  Location/Site:  L5-S1  Needle size: 20 G  Needle type: Tuohy  Needle Placement: Paramedian epidural  Findings:   -Comments: Excellent flow of contrast into the epidural space.  Procedure Details: Using a paramedian approach from the side mentioned above, the region overlying the inferior lamina was localized under fluoroscopic visualization and the soft tissues overlying this structure were infiltrated with 4 ml. of 1% Lidocaine without Epinephrine. The Tuohy needle was inserted into the epidural space using a paramedian approach.   The epidural space was localized using loss of resistance along with lateral and bi-planar fluoroscopic views.  After negative aspirate for air, blood, and CSF, a 2 ml. volume of Isovue-250 was injected into the epidural space and the flow of contrast was observed. Radiographs were obtained for documentation purposes.    The injectate was administered into the level noted above.   Additional Comments:  The patient tolerated the procedure well Dressing: 2 x 2 sterile gauze and Band-Aid    Post-procedure details: Patient was observed during the procedure. Post-procedure instructions were reviewed.  Patient left the clinic in stable condition.

## 2019-10-28 NOTE — Progress Notes (Signed)
[name] [place] - 86 y.o. female MRN [id number]  Date of birth: 04/11/1934  Office Visit Note: Visit Date: 10/22/2019 PCP: Burnis Medin, MD Referred by: Burnis Medin, MD  Subjective: Chief Complaint  Patient presents with  . Lower Back - Pain  . Left Leg - Pain   HPI:  [name] is a 86 y.o. female who comes in today for planned Left L5-S1 Lumbar epidural steroid injection with fluoroscopic guidance.  The patient has failed conservative care including home exercise, medications, time and activity modification.  This injection will be diagnostic and hopefully therapeutic.  Please see requesting physician notes for further details and justification.   ROS Otherwise per HPI.  Assessment & Plan: Visit Diagnoses:  1. Lumbar radiculopathy     Plan: No additional findings.   Meds & Orders:  Meds ordered this encounter  Medications  . methylPREDNISolone acetate (DEPO-MEDROL) injection 40 mg    Orders Placed This Encounter  Procedures  . XR C-ARM NO REPORT  . Epidural Steroid injection    Follow-up: Return if symptoms worsen or fail to improve.   Procedures: No procedures performed  Lumbar Epidural Steroid Injection - Interlaminar Approach with Fluoroscopic Guidance  Patient: [name]      Date of Birth: 11-25-1933 MRN: [id number] PCP: Burnis Medin, MD      Visit Date: 10/22/2019   Universal Protocol:     Consent Given By: the patient  Position: PRONE  Additional Comments: Vital signs were monitored before and after the procedure. Patient was prepped and draped in the usual sterile fashion. The correct patient, procedure, and site was verified.   Injection Procedure Details:  Procedure Site One Meds Administered:  Meds ordered this encounter  Medications  . methylPREDNISolone acetate (DEPO-MEDROL) injection 40 mg     Laterality: Left  Location/Site:  L5-S1  Needle size: 20 G  Needle type: Tuohy  Needle Placement:  Paramedian epidural  Findings:   -Comments: Excellent flow of contrast into the epidural space.  Procedure Details: Using a paramedian approach from the side mentioned above, the region overlying the inferior lamina was localized under fluoroscopic visualization and the soft tissues overlying this structure were infiltrated with 4 ml. of 1% Lidocaine without Epinephrine. The Tuohy needle was inserted into the epidural space using a paramedian approach.   The epidural space was localized using loss of resistance along with lateral and bi-planar fluoroscopic views.  After negative aspirate for air, blood, and CSF, a 2 ml. volume of Isovue-250 was injected into the epidural space and the flow of contrast was observed. Radiographs were obtained for documentation purposes.    The injectate was administered into the level noted above.   Additional Comments:  The patient tolerated the procedure well Dressing: 2 x 2 sterile gauze and Band-Aid    Post-procedure details: Patient was observed during the procedure. Post-procedure instructions were reviewed.  Patient left the clinic in stable condition.    Clinical History: MRI LUMBAR SPINE WITHOUT CONTRAST  TECHNIQUE: Multiplanar, multisequence MR imaging of the lumbar spine was performed. No intravenous contrast was administered.  COMPARISON:  07/14/2011  FINDINGS: Segmentation:  5 lumbar type vertebral bodies  Alignment: No scoliosis. Degenerative anterolisthesis at L4-5 of 6 mm.  Vertebrae: No fracture or primary bone lesion. Chronic discogenic marrow changes at L5-S1.  Conus medullaris: Extends to the L1 level and appears normal.  Paraspinal and other soft tissues: Simple cysts of the left kidney measuring 3 cm. Enlarged from 2  cm on the study of 2013.  Disc levels:  Mild noncompressive disc bulges at L3-4 and above. No disc herniation, stenosis or neural compression.  L4-5: Advanced bilateral facet arthropathy  with anterolisthesis of 6 mm. Bulging of the disc. Mild narrowing of the lateral recesses and intervertebral foramina but without visible neural compression.  L5-S1: Chronic disc degeneration with loss of disc height, endplate osteophytes and bulging of the disc. Discogenic changes of the L5 and S1 endplates. Facet degeneration bilaterally. Mild narrowing of the lateral recesses and foramina but without visible neural compression.  Since the previous study, the degenerative changes at L5-S1 have worsened somewhat.  IMPRESSION: L4-5: Chronic bilateral facet arthropathy allowing 6 mm of anterolisthesis. Bulging of the disc. Mild narrowing of the lateral recesses without visible neural compression. The findings could relate to back pain.  L5-S1: Chronic disc degeneration with loss of disc height, endplate osteophytes and bulging of the disc. Bilateral facet degeneration. Mild narrowing of the lateral recesses and foramina. The findings could contribute to back pain. There would be some potential for L5 nerve root irritation in the foramina. Degenerative changes at this level have worsened since 2013.   Electronically Signed   By: Nelson Chimes M.D.   On: 12/13/2015 14:20     Objective:  VS:  HT:    WT:   BMI:     BP:140/73  HR:82bpm  TEMP: ( )  RESP:  Physical Exam   Imaging: No results found.

## 2019-10-29 ENCOUNTER — Telehealth: Payer: Self-pay | Admitting: Internal Medicine

## 2019-10-29 NOTE — Progress Notes (Signed)
  Chronic Care Management   Outreach Note  10/29/2019 Name: [name] MRN: [id number] DOB: April 13, 1934  Referred by: Burnis Medin, MD Reason for referral : No chief complaint on file.   A second unsuccessful telephone outreach was attempted today. The patient was referred to pharmacist for assistance with care management and care coordination.  Follow Up Plan:   [place]

## 2019-10-31 ENCOUNTER — Other Ambulatory Visit: Payer: Self-pay | Admitting: Internal Medicine

## 2019-11-05 ENCOUNTER — Telehealth: Payer: Self-pay | Admitting: Internal Medicine

## 2019-11-05 ENCOUNTER — Ambulatory Visit: Payer: Medicare Other | Admitting: Orthopedic Surgery

## 2019-11-05 DIAGNOSIS — M5410 Radiculopathy, site unspecified: Secondary | ICD-10-CM | POA: Diagnosis not present

## 2019-11-05 MED ORDER — HYDROCODONE-ACETAMINOPHEN 5-325 MG PO TABS: 1.0000 | ORAL_TABLET | Freq: Two times a day (BID) | ORAL | 0 refills | Status: DC | PRN

## 2019-11-05 NOTE — Progress Notes (Signed)
  Chronic Care Management   Note  11/05/2019 Name: [name] MRN: [id number] DOB: [birth year]  [name] is a 86 y.o. year old female who is a primary care patient of Panosh, Standley Brooking, MD. I reached out to [facility] by phone today in response to a referral sent by [name]'s PCP, Panosh, Standley Brooking, MD.   [name] was given information about Chronic Care Management services today including:  1. CCM service includes personalized support from designated clinical staff supervised by her physician, including individualized plan of care and coordination with other care providers 2. 24/7 contact phone numbers for assistance for urgent and routine care needs. 3. Service will only be billed when office clinical staff spend 20 minutes or more in a month to coordinate care. 4. Only one practitioner may furnish and bill the service in a calendar month. 5. The patient may stop CCM services at any time (effective at the end of the month) by phone call to the office staff.   Patient agreed to services and verbal consent obtained.   Follow up plan:   [place]

## 2019-11-07 ENCOUNTER — Other Ambulatory Visit: Payer: Self-pay | Admitting: Internal Medicine

## 2019-11-08 ENCOUNTER — Encounter: Payer: Self-pay | Admitting: Orthopedic Surgery

## 2019-11-08 NOTE — Progress Notes (Signed)
Office Visit Note   Patient: [name]           Date of Birth: 1933-12-29           MRN: [id number] Visit Date: 11/05/2019 Requested by: Burnis Medin, MD [place],  [place] [address] PCP: Burnis Medin, MD  Subjective: Chief Complaint  Patient presents with  . Lower Back - Follow-up    HPI: [name] is a 86 y.o. female who presents to the office complaining of radicular leg pain.  Patient notes pain in her left leg that travels from her low back down the posterior aspect of her left lower extremity into the bottom of her left foot.  She had ESI with Dr. Ernestina Patches that was done on 10/22/2019 that provided excellent relief for 1.5 weeks but now she is starting to have more pain again.  She denies any right side symptoms.  Denies any red flag symptoms such as bowel/bladder incontinence.  She denies any groin pain.  She is taking Tylenol for pain.  She takes Plavix..                ROS:  All systems reviewed are negative as they relate to the chief complaint within the history of present illness.  Patient denies fevers or chills.  Assessment & Plan: Visit Diagnoses:  1. Radicular syndrome of left leg     Plan: Patient is an 86 year old female presents complaining of left leg radicular pain.  She had ESI by Dr. Ernestina Patches done earlier this month that provided great relief for 1.5 weeks but now pain is returned.  She has very mild weakness on exam.  No red flag symptoms associated with back pathology.  Prescribed Norco to take twice a day as needed and if pain does not improve recommended that she follow-up with Dr. Ernestina Patches again for another [facility] later this year.  She agreed with plan.  Follow-up as needed.  In general Chamaine has significant back pathology which is barely manageable with epidural steroid injections but she does not want surgery which is understandable.  She does take pain medicine but one prescription does tend to last her for many months.   Cautioned her against becoming addicted to the pain medicine.  Follow-up as needed  Follow-Up Instructions: No follow-ups on file.   Orders:  No orders of the defined types were placed in this encounter.  Meds ordered this encounter  Medications  . HYDROcodone-acetaminophen (NORCO/VICODIN) 5-325 MG tablet    Sig: Take 1 tablet by mouth every 12 (twelve) hours as needed for moderate pain.    Dispense:  30 tablet    Refill:  0      Procedures: No procedures performed   Clinical Data: No additional findings.  Objective: Vital Signs: There were no vitals taken for this visit.  Physical Exam:  Constitutional: Patient appears well-developed HEENT:  Head: Normocephalic Eyes:EOM are normal Neck: Normal range of motion Cardiovascular: Normal rate Pulmonary/chest: Effort normal Neurologic: Patient is alert Skin: Skin is warm Psychiatric: Patient has normal mood and affect  Ortho Exam:  Ortho exam demonstrates tenderness to palpation throughout the axial lumbar spine with a positive straight leg raise on the left leg.  Negative straight leg raise in the right leg.  No pain with internal rotation/external rotation of the left hip.  5/5 motor strength of the bilateral hip flexors, quadriceps, plantarflexion.  Mild weakness of the hamstring and dorsiflexion of the left leg compared with  the contralateral side.  Decreased sensation of the left lower extremity.  Specialty Comments:  No specialty comments available.  Imaging: No results found.   PMFS History: Patient Active Problem List   Diagnosis Date Noted  . Nausea 11/03/2018  . Elevated troponin 11/03/2018  . Hyponatremia 11/03/2018  . Hypertensive heart disease with hypertensive chronic kidney disease 11/30/2017  . Fasting hyperglycemia 11/30/2017  . Renal cyst 03/27/2016  . CKD (chronic kidney disease) stage 3, GFR 30-59 ml/min (HCC) 08/23/2015  . Perceived hearing changes 06/29/2014  . Resistant hypertension  06/29/2014  . Lumbago 06/15/2014  . Pre-diabetes vs early DM  06/15/2014  . Dyspnea 04/27/2014  . Asthma, chronic 04/13/2014  . Elevated uric acid in blood 04/13/2014  . Essential hypertension 03/27/2014  . History of DVT (deep vein thrombosis)   . Palpitations 01/05/2014  . Anemia of chronic disease 01/06/2014  . Death of family member 09/10/13  . Leg edema 06/20/2013  . Bereavement due to life event 04/21/2013  . Other dysphagia 02/11/2013  . Colon cancer screening 02/11/2013  . Anxiety state 01/20/2013  . Leg cramps 01/20/2013  . Back pain 12/05/2012  . Family hx of colon cancer 10/21/2012  . Family hx of lung cancer 10/21/2012  . Family hx-breast malignancy 10/21/2012  . Renal insufficiency creatinine 1.3 10/21/2012  . Anemia, chronic disease 10/21/2012  . GERD (gastroesophageal reflux disease) 09/08/2012  . HTN (hypertension) 09/08/2012  . Hyperlipidemia 09/08/2012  . Asthma 09/08/2012  . Varicose veins of leg with complications 76/54/6503   Past Medical History:  Diagnosis Date  . Acute superficial venous thrombosis of left lower extremity 03/27/2014   concern about hx and potential extensive on exam tenderness calf and low dose lovenox 40 qd 2-4 weekselevetion and close  fu advised .   [name] Allergy   . Anemia   . Anxiety   . Arthritis    "shoulders" (09/09/2012)  . Asthma 09/08/2012  . Carotid bruit    [place] 2018  low risk 1 - 39%  . Diabetes mellitus without complication ([place])    "Borderline" per pt; being monitored.  no meds per pt  . Dyspnea 04/27/2014  . GERD (gastroesophageal reflux disease)   . Headache(784.0)    "related to my high blood pressure" (09/09/2012)  . History of DVT (deep vein thrombosis)    "RLE" (09/09/2012) coumadine cant take asa so on plavix   . HTN (hypertension) 09/08/2012  . Hyperlipidemia   . Lupus ([place])    "cured years ago" (09/09/2012)  . Other dysphagia 02/11/2013  . Syncope and collapse 01/21/2018  . Varicose veins   . Varicose veins  of leg with complications 5/46/5681    Family History  Problem Relation Age of Onset  . Hypertension Mother   . Breast cancer Mother   . Hypertension Father   . Cancer Sister        Colon and Breast Cancer  . Breast cancer Sister   . Colon cancer Sister        ? 50' s dx - died in 40's  . Hypertension Brother   . Rectal cancer Brother   . Stomach cancer Brother   . Breast cancer Brother   . Heart attack Daughter   . Esophageal cancer Daughter   . Lung cancer Other        both parents   . Breast cancer Paternal Aunt     Past Surgical History:  Procedure Laterality Date  . ABDOMINAL HYSTERECTOMY     partial  .  BREAST EXCISIONAL BIOPSY Right   . BREAST SURGERY    . CARPAL TUNNEL RELEASE Right 2000's  . CARPAL TUNNEL RELEASE Bilateral 10/21/2013   Procedure: BILATERAL  CARPAL TUNNEL RELEASE;  Surgeon: [name], MD;  Location: [facility];  Service: Orthopedics;  Laterality: Bilateral;  . SHOULDER OPEN ROTATOR CUFF REPAIR Bilateral 2000's  . TRIGGER FINGER RELEASE Right 10/21/2013   Procedure: RELEASE TRIGGER FINGER/A-1 PULLEY RIGHT MIDDLE AND RIGHT RING;  Surgeon: [name], MD;  Location: [facility];  Service: Orthopedics;  Laterality: Right;   Social History   Occupational History  . Not on file  Tobacco Use  . Smoking status: Never Smoker  . Smokeless tobacco: Never Used  Vaping Use  . Vaping Use: Never used  Substance and Sexual Activity  . Alcohol use: No  . Drug use: No  . Sexual activity: Not on file

## 2019-11-10 ENCOUNTER — Telehealth: Payer: Self-pay | Admitting: Emergency Medicine

## 2019-11-10 MED ORDER — BREZTRI AEROSPHERE 160-9-4.8 MCG/ACT IN AERO: 2.0000 | INHALATION_SPRAY | Freq: Two times a day (BID) | RESPIRATORY_TRACT | 5 refills | Status: DC

## 2019-11-10 NOTE — Telephone Encounter (Signed)
Please let her know that we have to conserve the samples to use them for new starts, etc. It sounds like the breztri cost is about as low as she can get.

## 2019-11-10 NOTE — Telephone Encounter (Signed)
Spoke with [facility] and advised her that I could send in the [place] to the pharmacy. She agreed and nothing further is needed.

## 2019-11-10 NOTE — Telephone Encounter (Signed)
Patient requesting prescription for [facility]. Sample provided on 10/20/2019. Cost to patient is $9.20. Please advise.

## 2019-11-12 ENCOUNTER — Other Ambulatory Visit: Payer: Self-pay | Admitting: Internal Medicine

## 2019-11-13 ENCOUNTER — Other Ambulatory Visit: Payer: Self-pay | Admitting: Internal Medicine

## 2019-11-13 DIAGNOSIS — I129 Hypertensive chronic kidney disease with stage 1 through stage 4 chronic kidney disease, or unspecified chronic kidney disease: Secondary | ICD-10-CM | POA: Diagnosis not present

## 2019-11-13 DIAGNOSIS — N184 Chronic kidney disease, stage 4 (severe): Secondary | ICD-10-CM | POA: Diagnosis not present

## 2019-11-13 DIAGNOSIS — N2581 Secondary hyperparathyroidism of renal origin: Secondary | ICD-10-CM | POA: Diagnosis not present

## 2019-11-13 DIAGNOSIS — N189 Chronic kidney disease, unspecified: Secondary | ICD-10-CM | POA: Diagnosis not present

## 2019-11-13 DIAGNOSIS — D631 Anemia in chronic kidney disease: Secondary | ICD-10-CM | POA: Diagnosis not present

## 2019-11-13 DIAGNOSIS — Z8739 Personal history of other diseases of the musculoskeletal system and connective tissue: Secondary | ICD-10-CM | POA: Diagnosis not present

## 2019-11-13 LAB — COMPREHENSIVE METABOLIC PANEL
Albumin: 3.8 (ref 3.5–5.0)
Calcium: 9.5 (ref 8.7–10.7)
GFR calc Af Amer: 20
GFR calc non Af Amer: 18

## 2019-11-13 LAB — CBC AND DIFFERENTIAL
HCT: 29 — AB (ref 36–46)
Hemoglobin: 9.5 — AB (ref 12.0–16.0)
Neutrophils Absolute: 5
Platelets: 210 (ref 150–399)
WBC: 6.9

## 2019-11-13 LAB — IRON,TIBC AND FERRITIN PANEL
Ferritin: 139
Iron: 27

## 2019-11-13 LAB — BASIC METABOLIC PANEL
BUN: 42 — AB (ref 4–21)
CO2: 26 — AB (ref 13–22)
Chloride: 103 (ref 99–108)
Creatinine: 2.5 — AB (ref <=1.1)
Glucose: 121
Potassium: 3.9 (ref 3.4–5.3)
Sodium: 141 (ref 137–147)

## 2019-11-13 LAB — CBC: RBC: 3.38 — AB (ref 3.87–5.11)

## 2019-11-24 ENCOUNTER — Ambulatory Visit: Payer: Medicare Other | Admitting: Emergency Medicine

## 2019-11-24 ENCOUNTER — Encounter: Payer: Self-pay | Admitting: Emergency Medicine

## 2019-11-24 ENCOUNTER — Other Ambulatory Visit: Payer: Self-pay

## 2019-11-24 DIAGNOSIS — J45909 Unspecified asthma, uncomplicated: Secondary | ICD-10-CM | POA: Diagnosis not present

## 2019-11-24 DIAGNOSIS — J310 Chronic rhinitis: Secondary | ICD-10-CM

## 2019-11-24 MED ORDER — AZELASTINE-FLUTICASONE 137-50 MCG/ACT NA SUSP: 1.0000 | Freq: Two times a day (BID) | NASAL | 6 refills | Status: DC

## 2019-11-24 MED ORDER — ALBUTEROL SULFATE (2.5 MG/3ML) 0.083% IN NEBU: INHALATION_SOLUTION | RESPIRATORY_TRACT | 6 refills | Status: DC

## 2019-11-24 NOTE — Assessment & Plan Note (Signed)
She has breakthrough symptoms even on Protonix twice daily.  Unclear whether there is anything to add.  Discussed GERD precautions and diet with her.  May need to discuss with gastroenterology going forward if persistent

## 2019-11-24 NOTE — Assessment & Plan Note (Signed)
Did not really change with the addition of loratadine and fluticasone nasal spray.  I will change the fluticasone to Dymista to see if she gets more benefit.  Continue the Singulair as ordered.

## 2019-11-24 NOTE — Progress Notes (Signed)
Subjective:    Patient ID: [name], female    DOB: 1933-10-07, 86 y.o.   MRN: [id number]  HPI 86 year old woman, never smoker with history of diabetes, right lower extremity DVT, hypertension, question SLE (?  Resolved), allergic rhinitis, followed previously in our office for moderate persistent asthma and secondary pulmonary hypertension. She had childhood asthma, has been on medication much of her life. She used to be on daily prednisone many years ago. She flares and requires pred occasionally - not in the last year.   She has been noticing intermittent wheeze, sometimes associated with exertion but not always. Can be worse in the am. Infrequent cough. She is using albuterol most mornings and evenings. Uses albuterol HFA most days 1-2x a day.    Echocardiogram 11/14/2018 reviewed by me, shows normal LV function with diastolic dysfunction, normal RV function with moderately elevated RVSP  VQ scan 10/15/2017 low probability for PE  Pulmonary function testing 11/26/2017 reviewed by me, shows moderate severe obstruction with a borderline bronchodilator response, coexisting restriction on volumes and a decreased diffusion capacity that corrects to the normal range when adjusted for alveolar volume  Home sleep study 6-25/19 showed an AHI of 0.8/h  [name] 11/24/19 --85 year old never smoker with diabetes, history of right lower extremity DVT, hypertension, questionable lupus.  Has been followed in our office for moderate persistent asthma and secondary pulmonary hypertension.  PFT 2019 with moderately severe obstruction as above.  I saw her about 1 month ago and she was having persistent shortness of breath, upper airway noise.  I changed her Advair to [facility] in order to avoid powdered formulation.  Added fluticasone nasal spray and loratadine, continue Singulair and pantoprazole.  She returns today reporting that her breathing is a bit better. Still some SOB w stairs but not needing any albuterol  at those times.  She is still having hoarseness, globus sensation and a lot of throat clearing. She has albuterol, uses a neb most mornings, and HFA in the evening. She does still have occasional breakthrough GERD sx.,    Review of Systems As per Hpi     Objective:   Physical Exam Vitals:   11/24/19 1155  BP: 130/62  Pulse: 72  Temp: 98.9 F (37.2 C)  TempSrc: Oral  SpO2: 100%  Weight: 174 lb (78.9 kg)  Height: 5\' 1"  (1.549 m)   Gen: Pleasant, overweight, in no distress,  normal affect  ENT: No lesions,  mouth clear,  oropharynx clear, no postnasal drip  Neck: No JVD, less UA noise today, still some throat clearing  Lungs: No use of accessory muscles, no crackles or wheezing on normal respiration  Cardiovascular: RRR, heart sounds normal, no murmur or gallops, no peripheral edema  Musculoskeletal: No deformities, no cyanosis or clubbing  Neuro: alert, awake, non focal  Skin: Warm, no lesions or rash       Assessment & Plan:  Asthma, chronic She has both asthma and also upper airway symptoms impacted by GERD and allergic rhinitis.  Both are only marginally controlled on good regimen.  Will try to adjust as able.  She benefited from the change to [place] and we will continue.  Discussed decreasing her albuterol frequency with her today.  GERD (gastroesophageal reflux disease) She has breakthrough symptoms even on Protonix twice daily.  Unclear whether there is anything to add.  Discussed GERD precautions and diet with her.  May need to discuss with gastroenterology going forward if persistent  Chronic rhinitis Did not really  change with the addition of loratadine and fluticasone nasal spray.  I will change the fluticasone to Dymista to see if she gets more benefit.  Continue the Singulair as ordered.  Baltazar Apo, MD, PhD 11/24/2019, 12:21 PM [facility] [phone] or if no answer [phone]

## 2019-11-24 NOTE — Patient Instructions (Addendum)
We will plan to continue Breztri 2 puffs twice a day.  Rinse and gargle after using. Keep albuterol available to use either 1 nebulizer treatment or 2 puffs if you need it for shortness of breath, chest tightness, wheezing. Please continue Singulair 10 mg each evening as you have been taking it. Please continue pantoprazole (Protonix) as you have been using it. Stop fluticasone nasal spray.  We will start Dymista 1 spray each nostril twice a day. Continue loratadine 10 mg once daily. Follow with Dr. Lamonte Sakai in 6 months or sooner if you have any problems.

## 2019-11-24 NOTE — Assessment & Plan Note (Signed)
She has both asthma and also upper airway symptoms impacted by GERD and allergic rhinitis.  Both are only marginally controlled on good regimen.  Will try to adjust as able.  She benefited from the change to [place] and we will continue.  Discussed decreasing her albuterol frequency with her today.

## 2019-11-28 ENCOUNTER — Ambulatory Visit
Admission: RE | Admit: 2019-11-28 | Discharge: 2019-11-28 | Disposition: A | Discharge status: Home / Self Care | Payer: Medicare Other | Source: Ambulatory Visit | Attending: Internal Medicine | Admitting: Internal Medicine

## 2019-11-28 ENCOUNTER — Other Ambulatory Visit: Payer: Self-pay

## 2019-11-28 DIAGNOSIS — Z1231 Encounter for screening mammogram for malignant neoplasm of breast: Secondary | ICD-10-CM | POA: Diagnosis not present

## 2019-12-03 ENCOUNTER — Other Ambulatory Visit: Payer: Self-pay | Admitting: Internal Medicine

## 2019-12-04 ENCOUNTER — Other Ambulatory Visit: Payer: Self-pay | Admitting: Internal Medicine

## 2019-12-05 ENCOUNTER — Other Ambulatory Visit: Payer: Self-pay

## 2019-12-05 MED ORDER — CITALOPRAM HYDROBROMIDE 10 MG PO TABS
10.0000 mg | ORAL_TABLET | Freq: Every day | ORAL | 0 refills | Status: DC
Start: 2019-12-05 — End: 2020-01-14

## 2019-12-10 ENCOUNTER — Other Ambulatory Visit: Payer: Self-pay | Admitting: Internal Medicine

## 2019-12-12 ENCOUNTER — Other Ambulatory Visit: Payer: Self-pay | Admitting: Internal Medicine

## 2019-12-15 ENCOUNTER — Other Ambulatory Visit: Payer: Self-pay | Admitting: Internal Medicine

## 2019-12-26 ENCOUNTER — Other Ambulatory Visit: Payer: Self-pay

## 2019-12-26 DIAGNOSIS — N184 Chronic kidney disease, stage 4 (severe): Secondary | ICD-10-CM

## 2019-12-26 DIAGNOSIS — I10 Essential (primary) hypertension: Secondary | ICD-10-CM

## 2019-12-30 ENCOUNTER — Telehealth: Payer: Self-pay

## 2019-12-30 NOTE — Progress Notes (Signed)
Chronic Care Management Pharmacy Assistant   Name: [name]  MRN: [id number] DOB: 09-06-1933  Reason for Encounter: Medication Review/Initial question for initial visit with Clinical Pharmacist.   PCP : Burnis Medin, MD  Allergies:   Allergies  Allergen Reactions  . Penicillins Nausea And Vomiting and Other (See Comments)    Has patient had a PCN reaction causing immediate rash, facial/tongue/throat swelling, SOB or lightheadedness with hypotension: Y Has patient had a PCN reaction causing severe rash involving mucus membranes or skin necrosis: Y Has patient had a PCN reaction that required hospitalization: N Has patient had a PCN reaction occurring within the last 10 years: N If all of the above answers are "NO", then may proceed with Cephalosporin use.   . Aspirin Other (See Comments)    Wheezing Acetaminophen is OK    Medications: Outpatient Encounter Medications as of 12/30/2019  Medication Sig  . clopidogrel (PLAVIX) 75 MG tablet TAKE 1 TABLET ONCE DAILY.  [name] acetaminophen (TYLENOL) 500 MG tablet Take 1,000 mg by mouth every 6 (six) hours as needed for moderate pain. Reported on 04/30/2015  . acetaminophen-codeine (TYLENOL #3) 300-30 MG tablet Take 1 tablet by mouth daily as needed for moderate pain.  [name] albuterol (PROVENTIL) (2.5 MG/3ML) 0.083% nebulizer solution USE 1 AMPULE EVERY 6 HOURS AS NEEDED FOR WHEEZE  . albuterol (VENTOLIN HFA) 108 (90 Base) MCG/ACT inhaler USE 2 PUFFS EVERY 6 HOURS AS NEEDED FOR WHEEZING.  [name] amLODipine (NORVASC) 5 MG tablet Take 1 tablet (5 mg total) by mouth daily.  [name] atorvastatin (LIPITOR) 20 MG tablet TAKE 1 TABLET ONCE DAILY.  [name] Azelastine-Fluticasone (DYMISTA) 137-50 MCG/ACT SUSP Place 1 spray into the nose in the morning and at bedtime.  . Budeson-Glycopyrrol-Formoterol (BREZTRI AEROSPHERE) 160-9-4.8 MCG/ACT AERO Inhale 2 puffs into the lungs in the morning and at bedtime.  . carvedilol (COREG) 6.25 MG tablet Take 6.25 mg by  mouth daily.  . citalopram (CELEXA) 10 MG tablet Take 1 tablet (10 mg total) by mouth daily.  . diclofenac sodium (VOLTAREN) 1 % GEL APPLY 4GM TO AFFECTED AREA(S) 4 TIMES A DAY.  [name] doxazosin (CARDURA) 2 MG tablet TAKE 3 TABLETS AT BEDTIME.  . ferrous sulfate 325 (65 FE) MG EC tablet Take 1 tablet (325 mg total) by mouth daily with breakfast.  . fluorometholone (FML) 0.1 % ophthalmic suspension Place 1 drop into both eyes 2 (two) times daily.   . fluticasone (FLONASE) 50 MCG/ACT nasal spray Place 2 sprays into both nostrils daily.  . Fluticasone-Salmeterol (ADVAIR) 250-50 MCG/DOSE AEPB USE 1 PUFF TWICE DAILY.  . furosemide (LASIX) 40 MG tablet Take 80 mg by mouth 3 (three) times daily.   . hydrALAZINE (APRESOLINE) 50 MG tablet Take 50 mg by mouth 3 (three) times daily.  [name] HYDROcodone-acetaminophen (NORCO/VICODIN) 5-325 MG tablet Take 1 tablet by mouth every 12 (twelve) hours as needed for moderate pain.  [name] lactobacillus acidophilus & bulgar (LACTINEX) chewable tablet Chew 1 tablet by mouth 3 (three) times daily with meals.  . lidocaine (LIDODERM) 5 % Place 1 patch onto the skin daily. Remove & Discard patch within 12 hours or as directed by MD  . loratadine (CLARITIN) 10 MG tablet Take 1 tablet (10 mg total) by mouth daily.  [name] LORazepam (ATIVAN) 1 MG tablet TAKE 1 TABLET EVERY 8 HOURS AS NEEDED FOR ANXIETY. AVOID REGULAR USE.  . montelukast (SINGULAIR) 10 MG tablet TAKE 1 TABLET ONCE DAILY.  [name] ondansetron (ZOFRAN ODT) 4 MG disintegrating tablet  Take 1 tablet (4 mg total) by mouth every 8 (eight) hours as needed for nausea or vomiting.  . pantoprazole (PROTONIX) 40 MG tablet TAKE (1) TABLET TWICE A DAY BEFORE MEALS.  [name] potassium chloride (KLOR-CON) 10 MEQ tablet TAKE 4 TABLETS DAILY.  [name] RESTASIS 0.05 % ophthalmic emulsion Place 1 drop into both eyes 2 (two) times daily.   [name] Spacer/Aero-Holding Chambers (AEROCHAMBER PLUS) inhaler Use as instructed  . vitamin B-12 (CYANOCOBALAMIN) 1000 MCG tablet  Take 1,000 mcg by mouth daily.   No facility-administered encounter medications on file as of 12/30/2019.    Current Diagnosis: Patient Active Problem List   Diagnosis Date Noted  . Chronic rhinitis 11/24/2019  . Nausea 11/03/2018  . Elevated troponin 11/03/2018  . Hyponatremia 11/03/2018  . Hypertensive heart disease with hypertensive chronic kidney disease 11/30/2017  . Fasting hyperglycemia 11/30/2017  . Renal cyst 03/27/2016  . CKD (chronic kidney disease) stage 3, GFR 30-59 ml/min (HCC) 08/23/2015  . Perceived hearing changes 06/29/2014  . Resistant hypertension 06/29/2014  . Lumbago 06/15/2014  . Pre-diabetes vs early DM  06/15/2014  . Dyspnea 04/27/2014  . Asthma, chronic 04/13/2014  . Elevated uric acid in blood 04/13/2014  . Essential hypertension 03/27/2014  . History of DVT (deep vein thrombosis)   . Palpitations 01/05/2014  . Anemia of chronic disease 01/14/2014  . Death of family member 09/18/2013  . Leg edema 06/20/2013  . Bereavement due to life event 04/21/2013  . Other dysphagia 02/11/2013  . Colon cancer screening 02/11/2013  . Anxiety state 01/20/2013  . Leg cramps 01/20/2013  . Back pain 12/05/2012  . Family hx of colon cancer 10/21/2012  . Family hx of lung cancer 10/21/2012  . Family hx-breast malignancy 10/21/2012  . Renal insufficiency creatinine 1.3 10/21/2012  . Anemia, chronic disease 10/21/2012  . GERD (gastroesophageal reflux disease) 09/08/2012  . HTN (hypertension) 09/08/2012  . Hyperlipidemia 09/08/2012  . Varicose veins of leg with complications 01/75/1025    Goals Addressed   None     Follow-Up:  Pharmacist Review   Have you seen any other providers since your last visit? no Any changes in your medications or health? no Any side effects from any medications? no Do you have an symptoms or problems not managed by your medications? no Any concerns about your health right now? Yes  Patient complains of her legs swelling ,left leg  is worst  then right leg.Patient stated she has pain and discomfort in her legs due to the swelling.   Has your provider asked that you check blood pressure, blood sugar, or follow special diet at home? Yes  Patient reported checking her blood pressure on 12/29/2019 blood pressure was 140/58 at 2:00 PM. Do you get any type of exercise on a regular basis? No  Patient stated  she has not exercise due to leg swelling and bad nerve pain  in her back. Can you think of a goal you would like to reach for your health?   Patient stated to have 100% of good over all health  Do you have any problems getting your medications? no Is there anything that you would like to discuss during the appointment?    Patient stated she would like to discuss leg swelling and anxiety  Please bring medications and supplements to appointment  Patient verbalized understanding   [place] Pharmacist Assistant [phone]

## 2019-12-31 ENCOUNTER — Ambulatory Visit: Payer: Medicare Other

## 2019-12-31 ENCOUNTER — Other Ambulatory Visit: Payer: Self-pay

## 2019-12-31 DIAGNOSIS — I10 Essential (primary) hypertension: Secondary | ICD-10-CM

## 2019-12-31 DIAGNOSIS — J45909 Unspecified asthma, uncomplicated: Secondary | ICD-10-CM

## 2019-12-31 DIAGNOSIS — I1A0 Resistant hypertension: Secondary | ICD-10-CM

## 2019-12-31 NOTE — Chronic Care Management (AMB) (Signed)
Chronic Care Management Pharmacy  Name: [name]  MRN: [id number] DOB: 1933/11/06   Chief Complaint/ HPI  [place],  86 y.o. , female presents for their Initial CCM visit with the clinical pharmacist In office.  PCP : Burnis Medin, MD Patient Care Team: Burnis Medin, MD as PCP - General (Internal Medicine) Nahser, Wonda Cheng, MD as PCP - Cardiology (Cardiology) Marlou Sa Tonna Corner, MD (Orthopedic Surgery) Truitt Merle, MD as Consulting Physician (Hematology) Madelon Lips, MD as Consulting Physician (Nephrology) Monna Fam, MD as Consulting Physician (Ophthalmology) Germaine Pomfret, [facility] as Pharmacist (Pharmacist)  Their chronic conditions include: Hypertension, Hyperlipidemia, GERD, Asthma, Chronic Kidney Disease and Anxiety   Office Visits: 09/19/19: Patient presented to Dr. Regis Bill for follow-up. Citalopram decreased to 10 mg daily. Referral placed to pulmonology.   Consult Visit: 11/24/19: Patient presented to Dr. Lamonte Sakai (Pulmonology) for follow-up. No benefit to rhinitis with addition of loratadine and Flonase. Patient started on Dymista.  11/05/19: Patient presented to Dr. Marlou Sa (Ortho) for follow-up. Hydrocodone-APAP refilled.  10/20/19: Patient referred to Dr. Lamonte Sakai (Pulmonology) for asthma. Patient started on loratidine, flonase for rhinitis. Patient changed to Breztri 2 puff twice daily.  09/22/19: Patient presented to Gloriann Loan, PA-C for trigger finger. Patient prescribed Tynelol #3 and prednisone.   Allergies  Allergen Reactions  . Penicillins Nausea And Vomiting and Other (See Comments)    Has patient had a PCN reaction causing immediate rash, facial/tongue/throat swelling, SOB or lightheadedness with hypotension: Y Has patient had a PCN reaction causing severe rash involving mucus membranes or skin necrosis: Y Has patient had a PCN reaction that required hospitalization: N Has patient had a PCN reaction occurring within the last 10  years: N If all of the above answers are "NO", then may proceed with Cephalosporin use.   . Aspirin Other (See Comments)    Wheezing Acetaminophen is OK    Medications: Outpatient Encounter Medications as of 12/31/2019  Medication Sig  . acetaminophen (TYLENOL) 500 MG tablet Take 1,000 mg by mouth every 6 (six) hours as needed for moderate pain. Reported on 04/30/2015  . albuterol (PROVENTIL) (2.5 MG/3ML) 0.083% nebulizer solution USE 1 AMPULE EVERY 6 HOURS AS NEEDED FOR WHEEZE  . albuterol (VENTOLIN HFA) 108 (90 Base) MCG/ACT inhaler USE 2 PUFFS EVERY 6 HOURS AS NEEDED FOR WHEEZING.  [name] amLODipine (NORVASC) 5 MG tablet Take 1 tablet (5 mg total) by mouth daily.  [name] atorvastatin (LIPITOR) 20 MG tablet TAKE 1 TABLET ONCE DAILY.  [name] Azelastine-Fluticasone (DYMISTA) 137-50 MCG/ACT SUSP Place 1 spray into the nose in the morning and at bedtime.  . Budeson-Glycopyrrol-Formoterol (BREZTRI AEROSPHERE) 160-9-4.8 MCG/ACT AERO Inhale 2 puffs into the lungs in the morning and at bedtime.  . carvedilol (COREG) 6.25 MG tablet Take 6.25 mg by mouth daily.  . citalopram (CELEXA) 10 MG tablet Take 1 tablet (10 mg total) by mouth daily.  . clopidogrel (PLAVIX) 75 MG tablet TAKE 1 TABLET ONCE DAILY.  [name] doxazosin (CARDURA) 2 MG tablet TAKE 3 TABLETS AT BEDTIME.  . ferrous sulfate 325 (65 FE) MG EC tablet Take 1 tablet (325 mg total) by mouth daily with breakfast.  . fluorometholone (FML) 0.1 % ophthalmic suspension Place 1 drop into both eyes 2 (two) times daily.   . furosemide (LASIX) 40 MG tablet Take 80 mg by mouth 3 (three) times daily.   . hydrALAZINE (APRESOLINE) 50 MG tablet Take 50 mg by mouth 3 (three) times daily.  [name] HYDROcodone-acetaminophen (NORCO/VICODIN) 5-325 MG  tablet Take 1 tablet by mouth every 12 (twelve) hours as needed for moderate pain.  [name] lidocaine (LIDODERM) 5 % Place 1 patch onto the skin daily. Remove & Discard patch within 12 hours or as directed by MD  . loratadine (CLARITIN) 10 MG  tablet Take 1 tablet (10 mg total) by mouth daily. (Patient taking differently: Take 10 mg by mouth daily as needed. )  . montelukast (SINGULAIR) 10 MG tablet TAKE 1 TABLET ONCE DAILY.  . pantoprazole (PROTONIX) 40 MG tablet TAKE (1) TABLET TWICE A DAY BEFORE MEALS.  [name] potassium chloride (KLOR-CON) 10 MEQ tablet TAKE 4 TABLETS DAILY.  [name] RESTASIS 0.05 % ophthalmic emulsion Place 1 drop into both eyes 2 (two) times daily.   . vitamin B-12 (CYANOCOBALAMIN) 1000 MCG tablet Take 1,000 mcg by mouth daily.  . Vitamin D, Cholecalciferol, 25 MCG (1000 UT) TABS Take 1,000 Units by mouth daily.  [name] acetaminophen-codeine (TYLENOL #3) 300-30 MG tablet Take 1 tablet by mouth daily as needed for moderate pain. (Patient not taking: Reported on 01/01/2020)  . diclofenac sodium (VOLTAREN) 1 % GEL APPLY 4GM TO AFFECTED AREA(S) 4 TIMES A DAY. (Patient not taking: Reported on 12/31/2019)  . fluticasone (FLONASE) 50 MCG/ACT nasal spray Place 2 sprays into both nostrils daily. (Patient not taking: Reported on 12/31/2019)  . Fluticasone-Salmeterol (ADVAIR) 250-50 MCG/DOSE AEPB USE 1 PUFF TWICE DAILY. (Patient not taking: Reported on 12/31/2019)  . lactobacillus acidophilus & bulgar (LACTINEX) chewable tablet Chew 1 tablet by mouth 3 (three) times daily with meals. (Patient not taking: Reported on 12/31/2019)  . LORazepam (ATIVAN) 1 MG tablet TAKE 1 TABLET EVERY 8 HOURS AS NEEDED FOR ANXIETY. AVOID REGULAR USE. (Patient not taking: Reported on 12/31/2019)  . ondansetron (ZOFRAN ODT) 4 MG disintegrating tablet Take 1 tablet (4 mg total) by mouth every 8 (eight) hours as needed for nausea or vomiting. (Patient not taking: Reported on 12/31/2019)  . Spacer/Aero-Holding Chambers (AEROCHAMBER PLUS) inhaler Use as instructed   No facility-administered encounter medications on file as of 12/31/2019.   Current Diagnosis/Assessment:  SDOH Interventions     Most Recent Value  SDOH Interventions  Financial Strain Interventions  Intervention Not Indicated  Transportation Interventions Intervention Not Indicated      Goals Addressed            This Visit's Progress   . Chronic Care Management       CARE PLAN ENTRY (see longitudinal plan of care for additional care plan information)  Current Barriers:  . Chronic Disease Management support, education, and care coordination needs related to Hypertension, Hyperlipidemia, GERD, Asthma, Chronic Kidney Disease and Anxiety    Hypertension BP Readings from Last 3 Encounters:  11/24/19 130/62  10/22/19 140/73  10/20/19 124/60   . Pharmacist Clinical Goal(s): o Over the next 90 days, patient will work with PharmD and providers to achieve BP goal <140/90 . Current regimen:  . Amlodipine 5 mg daily  . Carvedilol 6.25 mg twice daily   . Doxazosin 2 mg 3 tablets nightly  . Furosemide 40 mg 2 tablets three times daily  . Hydralazine 50 mg three times daily  . Interventions: o Discussed low salt diet and exercising as tolerated extensively . Patient self care activities - Over the next 90 days, patient will: o Check blood pressure weekly, document, and provide at future appointments o Ensure daily salt intake < 2300 mg/day  Hyperlipidemia Lab Results  Component Value Date/Time   LDLCALC 97 09/19/2019 11:45 AM   .  Pharmacist Clinical Goal(s): o Over the next 90 days, patient will work with PharmD and providers to maintain LDL goal < 100 . Current regimen:  o Atorvastatin 20 mg daily  . Interventions: o Discussed low cholesterol diet and exercising as tolerated extensively  Asthma . Pharmacist Clinical Goal(s) o Over the next 90 days, patient will work with PharmD and providers to maintain stable breathing and prevent acute worsening  . Current regimen:  . Proventil 0.083% nebulizer solution . Ventolin HFA 2 puff q6hr PRN  . Breztri 2 puffs twice daily  . Montelukast 10 mg daily  . Interventions: o Counseled on proper inhaler technique  Medication  management . Pharmacist Clinical Goal(s): o Over the next 90 days, patient will work with PharmD and providers to maintain optimal medication adherence . Current pharmacy: [facility] . Interventions o Comprehensive medication review performed. o Continue current medication management strategy . Patient self care activities - Over the next 90 days, patient will: o Take medications as prescribed o Report any questions or concerns to PharmD and/or provider(s)       Asthma   Last spirometry score: FEV1 66% (11/26/17)  Eosinophil count:   Lab Results  Component Value Date/Time   EOSPCT 1.8 09/19/2019 11:45 AM   EOSPCT 1.6 06/19/2016 01:01 PM  %                               Eos (Absolute):  Lab Results  Component Value Date/Time   EOSABS 0.1 09/19/2019 11:45 AM   EOSABS 0.1 06/19/2016 01:01 PM    Tobacco Status:  Social History   Tobacco Use  Smoking Status Never Smoker  Smokeless Tobacco Never Used    Patient has failed these meds in past: Advair  Patient is currently controlled on the following medications:  . Proventil 0.083% nebulizer solution . Ventolin HFA 2 puff q6hr PRN  . Breztri 2 puffs twice daily  . Montelukast 10 mg daily   Using maintenance inhaler regularly? Yes Frequency of rescue inhaler use:  multiple times per day    We discussed:  proper inhaler technique  Plan  Continue current medications  Hypertension   BP goal is:  <140/90  Office blood pressures are  BP Readings from Last 3 Encounters:  11/24/19 130/62  10/22/19 140/73  10/20/19 124/60   Kidney Function Lab Results  Component Value Date/Time   CREATININE 2.34 (H) 09/19/2019 11:45 AM   CREATININE 2.57 (H) 05/21/2019 11:33 AM   CREATININE 1.4 (H) 06/19/2016 01:01 PM   CREATININE 1.6 (H) 08/16/2015 11:36 AM   GFR 23.98 (L) 09/19/2019 11:45 AM   GFRNONAA 28 (L) 11/04/2018 05:19 AM   GFRAA 33 (L) 11/04/2018 05:19 AM   K 4.1 09/19/2019 11:45 AM   K 4.0 05/21/2019 11:33  AM   K 3.8 06/19/2016 01:01 PM   K 3.7 08/16/2015 11:36 AM   Patient checks BP at home several times per month Patient home BP readings are ranging: 140/59,   Patient has failed these meds in the past: n/a Patient is currently controlled on the following medications:  . Amlodipine 5 mg daily  . Carvedilol 6.25 mg BID  . Doxazosin 2 mg 3 tabs QHS  . Furosemide 40 mg 2 tabs TID  . Hydralazine 50 mg TID   We discussed diet and exercise extensively  Plan  Continue current medications   Hyperlipidemia   LDL goal < 100  Lipid Panel  Component Value Date/Time   CHOL 174 09/19/2019 1145   TRIG 100.0 09/19/2019 1145   HDL 57.50 09/19/2019 1145   LDLCALC 97 09/19/2019 1145    Hepatic Function Latest Ref Rng & Units 09/19/2019 11/02/2018 03/25/2018  Total Protein 6.0 - 8.3 g/dL 6.8 5.9(L) 6.3  Albumin 3.5 - 5.2 g/dL 4.0 3.3(L) 4.0  AST 0 - 37 U/L 16 18 18   ALT 0 - 35 U/L 6 7 6   Alk Phosphatase 39 - 117 U/L 51 40 37(L)  Total Bilirubin 0.2 - 1.2 mg/dL 0.4 0.5 0.5  Bilirubin, Direct 0.0 - 0.3 mg/dL 0.1 - 0.1     The ASCVD Risk score Mikey Bussing DC Jr., et al., 2013) failed to calculate for the following reasons:   The 2013 ASCVD risk score is only valid for ages 82 to 15   Patient has failed these meds in past: Fenofibrate  Patient is currently controlled on the following medications:  . Atorvastatin 20 mg daily   We discussed:  diet and exercise extensively  Plan  Continue current medications  Anxiety   PHQ9 Score:  PHQ9 SCORE ONLY 05/21/2019 03/25/2018 10/06/2016  PHQ-9 Total Score 0 0 0   GAD7 Score: No flowsheet data found.  Patient has failed these meds in past: n/a Patient is currently controlled on the following medications:  . Citalopram 10 mg daily  . Lorazepam 1 mg q8hr PRN   We discussed:  Mood symptoms well controlled since decreased dose of citalopram.   Plan  Continue current medications   Allergic Rhinitis   Patient has failed these meds in  past: n/a Patient is currently controlled on the following medications:  [name] Dymista 1 spray twice daily  . Loratidine 10 mg daily    We discussed:  n/a  Plan  Continue current medications  Chronic Pain   Patient has failed these meds in past: n/a Patient is currently controlled on the following medications:  . Acetaminophen 500 mg 2 tabs q6hr PRN  . Acetaminophen-Codeine 300-30 mg 1 tab daily PRN ( made her sick)  . Hydrocodone-Acetaminophen 5-325 q12hr PRN  . Lidocaine 5% patch daily  . Voltaren 1% gel   We discussed:  Advised not to exceed 3000 mg acetaminophen daily   Plan  Continue current medications  GERD   Patient has failed these meds in past: n/a Patient is currently controlled on the following medications:  . Pantoprazole 40 mg twice daily   We discussed:  Still has breakthrough symptoms on occasion despite PPI twice daily. Discussed trigger avoidance.   Plan  Continue current medications  History of DVT    DVT RLE 2014   Patient has failed these meds in past: Aspirin (Wheezing)  Patient is currently controlled on the following medications:  . Clopidogrel 75 mg daily   We discussed:  Denies unusual bruising/bleeding   Plan  Continue current medications  Misc / OTC   . Ferrous Sulfate 325 mg daily  . Fluorometholone 0.1% ophth . Lactinex 1 chew TID  . Ondansetron ODT 4 mg q8hr PRN  . Potassium 10 meQ 4 tabs daily  . Restasis ophthalmic 1 drop BID  . Vitamin B12 1000 mcg daily  . Vitamin D 1000 units daily   Plan  Continue current medications  Medication Management   Pt uses [facility] for all medications Uses pill box? Yes  Plan  Continue current medication management strategy  Follow up: 12 month phone visit  [facility]  at [place]

## 2020-01-01 NOTE — Patient Instructions (Addendum)
Visit Information It was great speaking with you today!  Please let me know if you have any questions about our visit.  Goals Addressed            This Visit's Progress   . Chronic Care Management       CARE PLAN ENTRY (see longitudinal plan of care for additional care plan information)  Current Barriers:  . Chronic Disease Management support, education, and care coordination needs related to Hypertension, Hyperlipidemia, GERD, Asthma, Chronic Kidney Disease and Anxiety    Hypertension BP Readings from Last 3 Encounters:  11/24/19 130/62  10/22/19 140/73  10/20/19 124/60   . Pharmacist Clinical Goal(s): o Over the next 90 days, patient will work with PharmD and providers to achieve BP goal <140/90 . Current regimen:  . Amlodipine 5 mg daily  . Carvedilol 6.25 mg twice daily   . Doxazosin 2 mg 3 tablets nightly  . Furosemide 40 mg 2 tablets three times daily  . Hydralazine 50 mg three times daily  . Interventions: o Discussed low salt diet and exercising as tolerated extensively . Patient self care activities - Over the next 90 days, patient will: o Check blood pressure weekly, document, and provide at future appointments o Ensure daily salt intake < 2300 mg/day  Hyperlipidemia Lab Results  Component Value Date/Time   LDLCALC 97 09/19/2019 11:45 AM   . Pharmacist Clinical Goal(s): o Over the next 90 days, patient will work with PharmD and providers to maintain LDL goal < 100 . Current regimen:  o Atorvastatin 20 mg daily  . Interventions: o Discussed low cholesterol diet and exercising as tolerated extensively  Asthma . Pharmacist Clinical Goal(s) o Over the next 90 days, patient will work with PharmD and providers to maintain stable breathing and prevent acute worsening  . Current regimen:  . Proventil 0.083% nebulizer solution . Ventolin HFA 2 puff q6hr PRN  . Breztri 2 puffs twice daily  . Montelukast 10 mg daily  . Interventions: o Counseled on proper  inhaler technique  Medication management . Pharmacist Clinical Goal(s): o Over the next 90 days, patient will work with PharmD and providers to maintain optimal medication adherence . Current pharmacy: [facility] . Interventions o Comprehensive medication review performed. o Continue current medication management strategy . Patient self care activities - Over the next 90 days, patient will: o Take medications as prescribed o Report any questions or concerns to PharmD and/or provider(s)       Ms. Boeh was given information about Chronic Care Management services today including:  1. CCM service includes personalized support from designated clinical staff supervised by her physician, including individualized plan of care and coordination with other care providers 2. 24/7 contact phone numbers for assistance for urgent and routine care needs. 3. Standard insurance, coinsurance, copays and deductibles apply for chronic care management only during months in which we provide at least 20 minutes of these services. Most insurances cover these services at 100%, however patients may be responsible for any copay, coinsurance and/or deductible if applicable. This service may help you avoid the need for more expensive face-to-face services. 4. Only one practitioner may furnish and bill the service in a calendar month. 5. The patient may stop CCM services at any time (effective at the end of the month) by phone call to the office staff.  Patient agreed to services and verbal consent obtained.    The patient verbalized understanding of instructions provided today and agreed to receive a  mailed copy of patient instruction and/or educational materials. Face to Face appointment with pharmacist scheduled for:  12/28/20 at 2:00 PM  [facility] [place]  Asthma, Adult  Asthma is a long-term (chronic) condition in which the  airways get tight and narrow. The airways are the breathing passages that lead from the nose and mouth down into the lungs. A person with asthma will have times when symptoms get worse. These are called asthma attacks. They can cause coughing, whistling sounds when you breathe (wheezing), shortness of breath, and chest pain. They can make it hard to breathe. There is no cure for asthma, but medicines and lifestyle changes can help control it. There are many things that can bring on an asthma attack or make asthma symptoms worse (triggers). Common triggers include:  Mold.  Dust.  Cigarette smoke.  Cockroaches.  Things that can cause allergy symptoms (allergens). These include animal skin flakes (dander) and pollen from trees or grass.  Things that pollute the air. These may include household cleaners, wood smoke, smog, or chemical odors.  Cold air, weather changes, and wind.  Crying or laughing hard.  Stress.  Certain medicines or drugs.  Certain foods such as dried fruit, potato chips, and grape juice.  Infections, such as a cold or the flu.  Certain medical conditions or diseases.  Exercise or tiring activities. Asthma may be treated with medicines and by staying away from the things that cause asthma attacks. Types of medicines may include:  Controller medicines. These help prevent asthma symptoms. They are usually taken every day.  Fast-acting reliever or rescue medicines. These quickly relieve asthma symptoms. They are used as needed and provide short-term relief.  Allergy medicines if your attacks are brought on by allergens.  Medicines to help control the body's defense (immune) system. Follow these instructions at home: Avoiding triggers in your home  Change your heating and air conditioning filter often.  Limit your use of fireplaces and wood stoves.  Get rid of pests (such as roaches and mice) and their droppings.  Throw away plants if you see mold on  them.  Clean your floors. Dust regularly. Use cleaning products that do not smell.  Have someone vacuum when you are not home. Use a vacuum cleaner with a HEPA filter if possible.  Replace carpet with wood, tile, or vinyl flooring. Carpet can trap animal skin flakes and dust.  Use allergy-proof pillows, mattress covers, and box spring covers.  Wash bed sheets and blankets every week in hot water. Dry them in a dryer.  Keep your bedroom free of any triggers.  Avoid pets and keep windows closed when things that cause allergy symptoms are in the air.  Use blankets that are made of polyester or cotton.  Clean bathrooms and kitchens with bleach. If possible, have someone repaint the walls in these rooms with mold-resistant paint. Keep out of the rooms that are being cleaned and painted.  Wash your hands often with soap and water. If soap and water are not available, use hand sanitizer.  Do not allow anyone to smoke in your home. General instructions  Take over-the-counter and prescription medicines only as told by your doctor. ? Talk with your doctor if you have questions about how or when to take your medicines. ? Make note if you need to use your medicines more often than usual.  Do not use any products that contain nicotine or tobacco, such as cigarettes and e-cigarettes. If  you need help quitting, ask your doctor.  Stay away from secondhand smoke.  Avoid doing things outdoors when allergen counts are high and when air quality is low.  Wear a ski mask when doing outdoor activities in the winter. The mask should cover your nose and mouth. Exercise indoors on cold days if you can.  Warm up before you exercise. Take time to cool down after exercise.  Use a peak flow meter as told by your doctor. A peak flow meter is a tool that measures how well the lungs are working.  Keep track of the peak flow meter's readings. Write them down.  Follow your asthma action plan. This is a  written plan for taking care of your asthma and treating your attacks.  Make sure you get all the shots (vaccines) that your doctor recommends. Ask your doctor about a flu shot and a pneumonia shot.  Keep all follow-up visits as told by your doctor. This is important. Contact a doctor if:  You have wheezing, shortness of breath, or a cough even while taking medicine to prevent attacks.  The mucus you cough up (sputum) is thicker than usual.  The mucus you cough up changes from clear or white to yellow, green, gray, or bloody.  You have problems from the medicine you are taking, such as: ? A rash. ? Itching. ? Swelling. ? Trouble breathing.  You need reliever medicines more than 2-3 times a week.  Your peak flow reading is still at 50-79% of your personal best after following the action plan for 1 hour.  You have a fever. Get help right away if:  You seem to be worse and are not responding to medicine during an asthma attack.  You are short of breath even at rest.  You get short of breath when doing very little activity.  You have trouble eating, drinking, or talking.  You have chest pain or tightness.  You have a fast heartbeat.  Your lips or fingernails start to turn blue.  You are light-headed or dizzy, or you faint.  Your peak flow is less than 50% of your personal best.  You feel too tired to breathe normally. Summary  Asthma is a long-term (chronic) condition in which the airways get tight and narrow. An asthma attack can make it hard to breathe.  Asthma cannot be cured, but medicines and lifestyle changes can help control it.  Make sure you understand how to avoid triggers and how and when to use your medicines. This information is not intended to replace advice given to you by your health care provider. Make sure you discuss any questions you have with your health care provider. Document Revised: 07/04/2018 Document Reviewed: 06/05/2016 Elsevier Patient  Education  2020 [employer].

## 2020-01-02 ENCOUNTER — Other Ambulatory Visit: Payer: Self-pay

## 2020-01-02 DIAGNOSIS — H43813 Vitreous degeneration, bilateral: Secondary | ICD-10-CM | POA: Diagnosis not present

## 2020-01-02 DIAGNOSIS — H35033 Hypertensive retinopathy, bilateral: Secondary | ICD-10-CM | POA: Diagnosis not present

## 2020-01-02 DIAGNOSIS — H40023 Open angle with borderline findings, high risk, bilateral: Secondary | ICD-10-CM | POA: Diagnosis not present

## 2020-01-02 DIAGNOSIS — H35373 Puckering of macula, bilateral: Secondary | ICD-10-CM | POA: Diagnosis not present

## 2020-01-02 NOTE — Telephone Encounter (Signed)
Pls advise.  

## 2020-01-05 ENCOUNTER — Other Ambulatory Visit: Payer: Self-pay | Admitting: Optometry

## 2020-01-05 DIAGNOSIS — H4710 Unspecified papilledema: Secondary | ICD-10-CM

## 2020-01-14 ENCOUNTER — Other Ambulatory Visit: Payer: Self-pay | Admitting: Internal Medicine

## 2020-01-14 ENCOUNTER — Encounter (HOSPITAL_COMMUNITY): Payer: Self-pay

## 2020-01-14 ENCOUNTER — Ambulatory Visit (HOSPITAL_COMMUNITY)
Admission: EM | Admit: 2020-01-14 | Discharge: 2020-01-14 | Disposition: A | Discharge status: Home / Self Care | Payer: Medicare Other | Attending: Family Medicine | Admitting: Family Medicine

## 2020-01-14 ENCOUNTER — Other Ambulatory Visit: Payer: Self-pay

## 2020-01-14 DIAGNOSIS — Z1152 Encounter for screening for COVID-19: Secondary | ICD-10-CM

## 2020-01-14 DIAGNOSIS — Z20822 Contact with and (suspected) exposure to covid-19: Secondary | ICD-10-CM | POA: Insufficient documentation

## 2020-01-14 NOTE — ED Triage Notes (Signed)
Pt is here wanting a COVID test, pt is denying ALL sxs today.

## 2020-01-15 LAB — SARS CORONAVIRUS 2 (TAT 6-24 HRS): SARS Coronavirus 2: NEGATIVE

## 2020-01-22 ENCOUNTER — Ambulatory Visit (HOSPITAL_COMMUNITY)
Admission: EM | Admit: 2020-01-22 | Discharge: 2020-01-22 | Disposition: A | Discharge status: Home / Self Care | Payer: Medicare Other | Attending: Emergency Medicine | Admitting: Emergency Medicine

## 2020-01-22 ENCOUNTER — Other Ambulatory Visit: Payer: Self-pay

## 2020-01-22 DIAGNOSIS — Z20822 Contact with and (suspected) exposure to covid-19: Secondary | ICD-10-CM

## 2020-01-22 NOTE — ED Triage Notes (Signed)
Pt presents to have covid test done. Denies any symptoms. Reports she has someone in her home that is positive.

## 2020-01-22 NOTE — Discharge Instructions (Signed)

## 2020-01-23 ENCOUNTER — Telehealth: Payer: Self-pay | Admitting: Internal Medicine

## 2020-01-23 LAB — SARS CORONAVIRUS 2 (TAT 6-24 HRS): SARS Coronavirus 2: POSITIVE — AB

## 2020-01-23 NOTE — Telephone Encounter (Signed)
Called patient and her granddaughter stated that patient is having trouble breathing and coughing up mucous, sounds very nasal and hoarse, and sounds like she has a head cold. Having diarrhea and might be from drinking milk. Taking mucinex DM and not working.  Granddaughter is wanting to know if she needs anything else as she is going to the pharmacy and they are all sick.  Please advise.

## 2020-01-23 NOTE — Telephone Encounter (Signed)
Pt granddaughter call and stated her grandmother was tested positive for covid  And want to know can dr.Pansoh call her something in and want a call back.

## 2020-01-23 NOTE — Telephone Encounter (Signed)
Talked with gd and  I will refer to  Infusion clinic for [facility]  rx advised as she is  Very high risk( 8)  even though vaccinated

## 2020-01-24 ENCOUNTER — Telehealth (HOSPITAL_COMMUNITY): Payer: Self-pay | Admitting: Adult Health

## 2020-01-24 NOTE — Telephone Encounter (Signed)
Called and LMOM regarding monoclonal antibody treatment for COVID 19 given to those who are at risk for complications and/or hospitalization of the virus.  Patient meets criteria based on: age, chronic lung disease, HTN, CHF, diabetes, and bmi greater than 60.    Call back number given: [phone]  My chart message: sent Text: sent to granddaughter phone  Wilber Bihari, NP

## 2020-01-24 NOTE — Telephone Encounter (Signed)
Patient Granddaughter sent this message to you this morning.  Patient was tested for Covid Thursday 9/9, and positive results received 01/23/20.  Hello I am messaging in regard to my grandmother [name]. She tested positive for COVID. We wanted to know was there any medicine that she needed to take or what are her next steps after finding out she tested positive .. if you could give [place] a call we would greatly appreciate it!   Message routed to Dr Lamonte Sakai to advise

## 2020-01-25 ENCOUNTER — Other Ambulatory Visit (HOSPITAL_COMMUNITY): Payer: Self-pay | Admitting: Nurse Practitioner

## 2020-01-25 DIAGNOSIS — U071 COVID-19: Secondary | ICD-10-CM

## 2020-01-25 DIAGNOSIS — I10 Essential (primary) hypertension: Secondary | ICD-10-CM

## 2020-01-25 NOTE — Progress Notes (Signed)
I connected by phone with [name] on 01/25/2020 at 9:50 AM to discuss the potential use of a new treatment for mild to moderate COVID-19 viral infection in non-hospitalized patients.  This patient is a 86 y.o. female that meets the FDA criteria for Emergency Use Authorization of COVID monoclonal antibody casirivimab/imdevimab.  Has a (+) direct SARS-CoV-2 viral test result (In Epic)   Has mild or moderate COVID-19   Is NOT hospitalized due to COVID-19  Is within 10 days of symptom onset (01/19/20)   Has at least one of the high risk factor(s) for progression to severe COVID-19 and/or hospitalization as defined in EUA.  Specific high risk criteria : Older age (>/= 84 yo), BMI > 25 and Cardiovascular disease or hypertension   I have spoken and communicated the following to the patient or parent/caregiver regarding COVID monoclonal antibody treatment:  1. FDA has authorized the emergency use for the treatment of mild to moderate COVID-19 in adults and pediatric patients with positive results of direct SARS-CoV-2 viral testing who are 65 years of age and older weighing at least 40 kg, and who are at high risk for progressing to severe COVID-19 and/or hospitalization.  2. The significant known and potential risks and benefits of COVID monoclonal antibody, and the extent to which such potential risks and benefits are unknown.  3. Information on available alternative treatments and the risks and benefits of those alternatives, including clinical trials.  4. Patients treated with COVID monoclonal antibody should continue to self-isolate and use infection control measures (e.g., wear mask, isolate, social distance, avoid sharing personal items, clean and disinfect "high touch" surfaces, and frequent handwashing) according to CDC guidelines.   5. The patient or parent/caregiver has the option to accept or refuse COVID monoclonal antibody treatment.  After reviewing this information with  the patient, The patient agreed to proceed with receiving casirivimab\imdevimab infusion and will be provided a copy of the Fact sheet prior to receiving the infusion. [name] 01/25/2020 9:50 AM

## 2020-01-26 ENCOUNTER — Ambulatory Visit (HOSPITAL_COMMUNITY)
Admission: RE | Admit: 2020-01-26 | Discharge: 2020-01-26 | Disposition: A | Discharge status: Home / Self Care | Payer: Medicare Other | Source: Ambulatory Visit | Attending: Pulmonary Disease | Admitting: Pulmonary Disease

## 2020-01-26 DIAGNOSIS — U071 COVID-19: Secondary | ICD-10-CM | POA: Diagnosis present

## 2020-01-26 DIAGNOSIS — Z23 Encounter for immunization: Secondary | ICD-10-CM | POA: Insufficient documentation

## 2020-01-26 DIAGNOSIS — I10 Essential (primary) hypertension: Secondary | ICD-10-CM | POA: Diagnosis not present

## 2020-01-26 MED ORDER — METHYLPREDNISOLONE SODIUM SUCC 125 MG IJ SOLR: 125.0000 mg | Freq: Once | INTRAMUSCULAR | Status: DC | PRN

## 2020-01-26 MED ORDER — ALBUTEROL SULFATE HFA 108 (90 BASE) MCG/ACT IN AERS: 2.0000 | INHALATION_SPRAY | Freq: Once | RESPIRATORY_TRACT | Status: DC | PRN

## 2020-01-26 MED ORDER — FAMOTIDINE IN NACL 20-0.9 MG/50ML-% IV SOLN: 20.0000 mg | Freq: Once | INTRAVENOUS | Status: DC | PRN

## 2020-01-26 MED ORDER — SODIUM CHLORIDE 0.9 % IV SOLN: INTRAVENOUS | Status: DC | PRN

## 2020-01-26 MED ORDER — EPINEPHRINE 0.3 MG/0.3ML IJ SOAJ: 0.3000 mg | Freq: Once | INTRAMUSCULAR | Status: DC | PRN

## 2020-01-26 MED ORDER — SODIUM CHLORIDE 0.9 % IV SOLN
1200.0000 mg | Freq: Once | INTRAVENOUS | Status: AC
  Administered 2020-01-26: 1200 mg via INTRAVENOUS
  Filled 2020-01-26: qty 10

## 2020-01-26 MED ORDER — DIPHENHYDRAMINE HCL 50 MG/ML IJ SOLN: 50.0000 mg | Freq: Once | INTRAMUSCULAR | Status: DC | PRN

## 2020-01-26 NOTE — Progress Notes (Signed)
  Diagnosis: COVID-19  Physician:Dr Joya Gaskins  Procedure: [facility] Med: casirivimab\imdevimab infusion - Provided patient with casirivimab\imdevimab fact sheet for patients, parents and caregivers prior to infusion.  Complications: No immediate complications noted.  Discharge: Discharged home   Robert Lee, [place] 01/26/2020

## 2020-01-26 NOTE — Discharge Instructions (Signed)

## 2020-01-26 NOTE — Telephone Encounter (Signed)
Please let them know that depending on the timing of onset of symptoms and when she tested positive she may be a candidate for monoclonal antibody treatment. If they are interested in will you please discuss with Tammy Parrett to see if she is a candidate??

## 2020-01-28 NOTE — Telephone Encounter (Signed)
I would like to refer this request to dr BYrum  Pulmonary  Since  just had MCABY infusion  May need a virtual visit or such to decide

## 2020-01-29 ENCOUNTER — Other Ambulatory Visit: Payer: Self-pay | Admitting: Emergency Medicine

## 2020-01-29 ENCOUNTER — Other Ambulatory Visit: Payer: Self-pay | Admitting: Internal Medicine

## 2020-01-29 ENCOUNTER — Encounter (HOSPITAL_BASED_OUTPATIENT_CLINIC_OR_DEPARTMENT_OTHER): Payer: Medicare Other | Admitting: Internal Medicine

## 2020-01-29 NOTE — Telephone Encounter (Signed)
I think we should treat her with a prednisone taper for flaring upper airway irritation and possible asthma >> Take 40mg  daily for 3 days, then 30mg  daily for 3 days, then 20mg  daily for 3 days, then 10mg  daily for 3 days, then stop

## 2020-01-30 ENCOUNTER — Telehealth: Payer: Self-pay | Admitting: Emergency Medicine

## 2020-01-30 MED ORDER — PREDNISONE 10 MG PO TABS: ORAL_TABLET | ORAL | 0 refills | Status: DC

## 2020-01-30 NOTE — Telephone Encounter (Signed)
Last OV 11/05/2019  Last filled 09/12/2019, # 24 with 0 refills

## 2020-01-30 NOTE — Telephone Encounter (Signed)
Prednisone prescription has been sent to pharmacy for pt. Nothing further needed.

## 2020-01-30 NOTE — Telephone Encounter (Signed)
pred was sent to pharm 4:45 Pt aware

## 2020-01-30 NOTE — Telephone Encounter (Signed)
Patient cell phone is [phone].

## 2020-02-10 ENCOUNTER — Telehealth: Payer: Self-pay | Admitting: Emergency Medicine

## 2020-02-10 MED ORDER — PREDNISONE 10 MG PO TABS: ORAL_TABLET | ORAL | 0 refills | Status: DC

## 2020-02-10 MED ORDER — AZITHROMYCIN 250 MG PO TABS: ORAL_TABLET | ORAL | 0 refills | Status: DC

## 2020-02-10 NOTE — Telephone Encounter (Signed)
Spoke to patient's granddaughter, [name](DPR),. [name] stated that patient was dx with covid on 01/23/2020. Since being dx she has had lingering symptoms.  Patient is experiencing productive cough with yellow mucus, wheezing and increased sob with exertion.  patient was prescribed prednisone on 01/30/2020. Sx improved with prednisone but have since worsened.  Patient also developed nausea and diarrhea yesterday. These symptoms are new.  [name] feels that patient is having an asthma flare and is requesting Rx for prednisone and antibiotic. Patient is taking mucinex, albuterol solution and Breztri BID.  Dr. Lamonte Sakai, please advise. Thanks

## 2020-02-10 NOTE — Telephone Encounter (Signed)
Please let them know that much of what she is experiencing is likely related to [place].  OK to treaty her with pred taper >> Take 40mg  daily for 3 days, then 30mg  daily for 3 days, then 20mg  daily for 3 days, then 10mg  daily for 3 days, then stop Also give her azithro >> Z pack.  Have them call us to let us know how she responds to the above

## 2020-02-10 NOTE — Telephone Encounter (Signed)
Lm for patient's granddaughter, [name](DPR)

## 2020-02-10 NOTE — Telephone Encounter (Signed)
[name] returning missed call.Can be reached at [phone]

## 2020-02-10 NOTE — Telephone Encounter (Signed)
Spoke with granddaughter [name], aware of recs.  rx's sent to preferred pharmacy.  Nothing further needed at this time- will close encounter.

## 2020-02-13 ENCOUNTER — Telehealth: Payer: Self-pay | Admitting: Emergency Medicine

## 2020-02-13 NOTE — Telephone Encounter (Signed)
Called and spoke with patient's granddaughter [name]. She stated that she had sent the message last week but they have received medication and were advised to call back once she finishes the medication. She appreciated the call.   Nothing further needed at time of call.

## 2020-02-16 ENCOUNTER — Telehealth: Payer: Self-pay

## 2020-02-16 NOTE — Progress Notes (Signed)
  I have attempted without success to contact this patient by phone three times to do her Hypertension Disease [place] call. I left a Voice message for patient to return my call.  [place] Pharmacist Assistant [phone]

## 2020-02-19 ENCOUNTER — Other Ambulatory Visit: Payer: Self-pay | Admitting: Internal Medicine

## 2020-02-19 NOTE — Telephone Encounter (Signed)
Pt is over due for an appointment.   Can you call pt to schedule follow up with PCP?

## 2020-02-20 NOTE — Telephone Encounter (Signed)
Was not able to contact by phone. Sent a MyChart message to have the patient contact the office to schedule an appointment.

## 2020-02-23 ENCOUNTER — Encounter (HOSPITAL_BASED_OUTPATIENT_CLINIC_OR_DEPARTMENT_OTHER): Payer: Medicare Other | Attending: Internal Medicine | Admitting: Internal Medicine

## 2020-02-23 DIAGNOSIS — I890 Lymphedema, not elsewhere classified: Secondary | ICD-10-CM | POA: Insufficient documentation

## 2020-02-23 DIAGNOSIS — Z86718 Personal history of other venous thrombosis and embolism: Secondary | ICD-10-CM | POA: Insufficient documentation

## 2020-02-23 DIAGNOSIS — I87303 Chronic venous hypertension (idiopathic) without complications of bilateral lower extremity: Secondary | ICD-10-CM | POA: Diagnosis not present

## 2020-02-23 DIAGNOSIS — E1122 Type 2 diabetes mellitus with diabetic chronic kidney disease: Secondary | ICD-10-CM | POA: Diagnosis not present

## 2020-02-23 DIAGNOSIS — I130 Hypertensive heart and chronic kidney disease with heart failure and stage 1 through stage 4 chronic kidney disease, or unspecified chronic kidney disease: Secondary | ICD-10-CM | POA: Diagnosis not present

## 2020-02-23 DIAGNOSIS — N184 Chronic kidney disease, stage 4 (severe): Secondary | ICD-10-CM | POA: Diagnosis not present

## 2020-02-23 DIAGNOSIS — Z880 Allergy status to penicillin: Secondary | ICD-10-CM | POA: Insufficient documentation

## 2020-02-23 DIAGNOSIS — I872 Venous insufficiency (chronic) (peripheral): Secondary | ICD-10-CM | POA: Diagnosis not present

## 2020-02-24 NOTE — Progress Notes (Signed)
[name] ([id number]) , Visit Report for 02/23/2020 Chief Complaint Document Details Patient Name: Date of Service: [name] RIS M. 02/23/2020 9:00 A M Medical Record Number: [id number] Patient Account Number: [id number] Date of Birth/Sex: Treating RN: 11-13-1933 (86 y.o. Female) Levan Hurst Primary Care Provider: [name] Other Clinician: Referring Provider: Treating Provider/Extender: Linton Ham UPTO Tretha Sciara BETH Weeks in Treatment: 0 Information Obtained from: Patient Chief Complaint 02/23/2020; patient is here for review of swelling and soreness in her legs however she has no open wounds. Electronic Signature(s) Signed: 02/23/2020 5:03:49 PM By: Linton Ham MD Entered By: Linton Ham on 02/23/2020 10:23:59 -------------------------------------------------------------------------------- HPI Details Patient Name: Date of Service: [name], [name] RIS M. 02/23/2020 9:00 A M Medical Record Number: [id number] Patient Account Number: [id number] Date of Birth/Sex: Treating RN: 11-04-1933 (86 y.o. Female) Levan Hurst Primary Care Provider: [name] Other Clinician: Referring Provider: Treating Provider/Extender: Linton Ham UPTO Tretha Sciara BETH Weeks in Treatment: 0 History of Present Illness HPI Description: ADMISSION 02/23/2020 This is an 86 year old woman who I think was referred from her nephrologist for review of lower extremity swelling. She has a history of a DVT and saw Dr. Oneida Alar at the end of 2020. Her venous reflux study showed left greater saphenous vein reflux. At the time she also had a chronic thrombus in one of the gastrocnemius veins but no evidence of a DVT On the right side she did not show any reflux and no evidence of superficial or deep vein thrombosis. She is . supposed to be wearing compression stockings although she came in today with what looks like support stockings. She does not have an open wound Past medical history  includes hypertension, stage IV chronic kidney disease, asthma, she is listed as a prediabetic, history of a DVT varicose veins, Covid, , lumbar radiculopathy, diastolic heart failure, ABIs were not calculated today Electronic Signature(s) Signed: 02/23/2020 5:03:49 PM By: Linton Ham MD Entered By: Linton Ham on 02/23/2020 10:26:47 -------------------------------------------------------------------------------- Physical Exam Details Patient Name: Date of Service: [name], [name] RIS M. 02/23/2020 9:00 A M Medical Record Number: [id number] Patient Account Number: [id number] Date of Birth/Sex: Treating RN: 06-16-1933 (86 y.o. Female) Levan Hurst Primary Care Provider: [name] Other Clinician: Referring Provider: Treating Provider/Extender: Linton Ham UPTO Tretha Sciara BETH Weeks in Treatment: 0 Constitutional Patient is hypertensive.. Pulse regular and within target range for patient.[name] Respirations regular, non-labored and within target range.. Temperature is normal and within the target range for the patient.[name] Appears in no distress. Respiratory work of breathing is normal. Bilateral breath sounds are clear and equal in all lobes with no wheezes, rales or rhonchi.. Cardiovascular Heart rhythm and rate regular, without murmur or gallop. No evidence of CHF. Pedal pulses were palpable.. 3+ nonpitting edema involving her dorsal feet extending to her knees. It does not go above her knees.. Neurological Monofilament testing was normal. Psychiatric appears at normal baseline. Notes Wound exam; the patient does not have an open wound on her bilateral lower extremity Electronic Signature(s) Signed: 02/23/2020 5:03:49 PM By: Linton Ham MD Entered By: Linton Ham on 02/23/2020 10:27:57 -------------------------------------------------------------------------------- Physician Orders Details Patient Name: Date of Service: [name], [name] RIS M. 02/23/2020 9:00 A  M Medical Record Number: [id number] Patient Account Number: [id number] Date of Birth/Sex: Treating RN: December 13, 1933 (86 y.o. Female) Levan Hurst Primary Care Provider: [name] Other Clinician: Referring Provider: Treating Provider/Extender: Linton Ham UPTO Tretha Sciara BETH Weeks in Treatment: 0 Verbal / Phone Orders: No Diagnosis Coding Discharge From [facility] Services Discharge from  [place] - no open wounds Skin Barriers/Peri-Wound Care Moisturizing lotion - both legs daily Edema Control Avoid standing for long periods of time Elevate legs to the level of the heart or above for 30 minutes daily and/or when sitting, a frequency of: - throughout the day Support Garment 20-30 mm/Hg pressure to: - both legs daily, apply first thing in the morning, remove at night before bed Electronic Signature(s) Signed: 02/23/2020 5:03:49 PM By: Linton Ham MD Signed: 02/23/2020 5:10:40 PM By: Levan Hurst RN, BSN Entered By: Levan Hurst on 02/23/2020 10:17:52 -------------------------------------------------------------------------------- Problem List Details Patient Name: Date of Service: [name], [name] RIS M. 02/23/2020 9:00 A M Medical Record Number: [id number] Patient Account Number: [id number] Date of Birth/Sex: Treating RN: Sep 13, 1933 (86 y.o. Female) Levan Hurst Primary Care Provider: [name] Other Clinician: Referring Provider: Treating Provider/Extender: Linton Ham UPTO Tretha Sciara BETH Weeks in Treatment: 0 Active Problems ICD-10 Encounter Code Description Active Date MDM Diagnosis I89.0 Lymphedema, not elsewhere classified 02/23/2020 No Yes I87.303 Chronic venous hypertension (idiopathic) without complications of bilateral 02/23/2020 No Yes lower extremity Inactive Problems Resolved Problems Electronic Signature(s) Signed: 02/23/2020 5:03:49 PM By: Linton Ham MD Entered By: Linton Ham on 02/23/2020  10:23:09 -------------------------------------------------------------------------------- Progress Note Details Patient Name: Date of Service: [name], [name] RIS M. 02/23/2020 9:00 A M Medical Record Number: [id number] Patient Account Number: [id number] Date of Birth/Sex: Treating RN: 04/23/1934 (86 y.o. Female) Levan Hurst Primary Care Provider: [name] Other Clinician: Referring Provider: Treating Provider/Extender: Linton Ham UPTO Tretha Sciara BETH Weeks in Treatment: 0 Subjective Chief Complaint Information obtained from Patient 02/23/2020; patient is here for review of swelling and soreness in her legs however she has no open wounds. History of Present Illness (HPI) ADMISSION 02/23/2020 This is an 86 year old woman who I think was referred from her nephrologist for review of lower extremity swelling. She has a history of a DVT and saw Dr. Oneida Alar at the end of 2020. Her venous reflux study showed left greater saphenous vein reflux. At the time she also had a chronic thrombus in one of the gastrocnemius veins but no evidence of a DVT On the right side she did not show any reflux and no evidence of superficial or deep vein thrombosis. She is . supposed to be wearing compression stockings although she came in today with what looks like support stockings. She does not have an open wound Past medical history includes hypertension, stage IV chronic kidney disease, asthma, she is listed as a prediabetic, history of a DVT varicose veins, Covid, , lumbar radiculopathy, diastolic heart failure, ABIs were not calculated today Patient History Information obtained from Patient. Allergies penicillin Family History Cancer - Mother,Father,Siblings, Hypertension - Mother,Father,Siblings, Lung Disease - Mother,Father,Siblings, No family history of Diabetes, Heart Disease, Hereditary Spherocytosis, Kidney Disease, Seizures, Stroke, Thyroid Problems, Tuberculosis. Social History Never  smoker, Marital Status - Single, Alcohol Use - Never, Drug Use - No History, Caffeine Use - Rarely. Medical History Eyes Denies history of Cataracts, Glaucoma, Optic Neuritis Ear/Nose/Mouth/Throat Denies history of Chronic sinus problems/congestion, Middle ear problems Hematologic/Lymphatic Denies history of Anemia, Hemophilia, Human Immunodeficiency Virus, Lymphedema, Sickle Cell Disease Respiratory Patient has history of Asthma Denies history of Aspiration, Chronic Obstructive Pulmonary Disease (COPD), Pneumothorax, Sleep Apnea, Tuberculosis Cardiovascular Patient has history of Hypertension Denies history of Angina, Arrhythmia, Congestive Heart Failure, Coronary Artery Disease, Deep Vein Thrombosis, Hypotension, Myocardial Infarction, Peripheral Arterial Disease, Peripheral Venous Disease, Phlebitis, Vasculitis Gastrointestinal Denies history of Cirrhosis , Colitis, Crohnoos, Hepatitis A, Hepatitis B, Hepatitis  C Endocrine Patient has history of Type II Diabetes - 4 Denies history of Type I Diabetes Genitourinary Denies history of End Stage Renal Disease Immunological Denies history of Lupus Erythematosus, Raynaudoos, Scleroderma Integumentary (Skin) Denies history of History of Burn Musculoskeletal Denies history of Gout, Rheumatoid Arthritis, Osteoarthritis, Osteomyelitis Neurologic Denies history of Dementia, Neuropathy, Quadriplegia, Paraplegia, Seizure Disorder Oncologic Denies history of Received Chemotherapy, Received Radiation Psychiatric Denies history of Anorexia/bulimia, Confinement Anxiety Patient is treated with Controlled Diet. Blood sugar is not tested. Review of Systems (ROS) Constitutional Symptoms (General Health) Denies complaints or symptoms of Fatigue, Fever, Chills, Marked Weight Change. Eyes Denies complaints or symptoms of Dry Eyes, Vision Changes, Glasses / Contacts. Ear/Nose/Mouth/Throat Denies complaints or symptoms of Chronic sinus problems or  rhinitis. Respiratory Denies complaints or symptoms of Chronic or frequent coughs, Shortness of Breath. Cardiovascular Denies complaints or symptoms of Chest pain. Gastrointestinal Denies complaints or symptoms of Frequent diarrhea, Nausea, Vomiting. Endocrine Denies complaints or symptoms of Heat/cold intolerance. Genitourinary Denies complaints or symptoms of Frequent urination. Integumentary (Skin) Complains or has symptoms of Wounds. Musculoskeletal Denies complaints or symptoms of Muscle Pain, Muscle Weakness. Neurologic Denies complaints or symptoms of Numbness/parasthesias. Psychiatric Denies complaints or symptoms of Claustrophobia, Suicidal. Objective Constitutional Patient is hypertensive.. Pulse regular and within target range for patient.[name] Respirations regular, non-labored and within target range.. Temperature is normal and within the target range for the patient.[name] Appears in no distress. Vitals Time Taken: 9:27 AM, Height: 61 in, Source: Stated, Weight: 170 lbs, Source: Stated, BMI: 32.1, Temperature: 98.7 F, Pulse: 85 bpm, Respiratory Rate: 18 breaths/min, Blood Pressure: 175/70 mmHg. Respiratory work of breathing is normal. Bilateral breath sounds are clear and equal in all lobes with no wheezes, rales or rhonchi.. Cardiovascular Heart rhythm and rate regular, without murmur or gallop. No evidence of CHF. Pedal pulses were palpable.. 3+ nonpitting edema involving her dorsal feet extending to her knees. It does not go above her knees.. Neurological Monofilament testing was normal. Psychiatric appears at normal baseline. General Notes: Wound exam; the patient does not have an open wound on her bilateral lower extremity Assessment Active Problems ICD-10 Lymphedema, not elsewhere classified Chronic venous hypertension (idiopathic) without complications of bilateral lower extremity Plan Discharge From [facility] Services: Discharge from [place] - no  open wounds Skin Barriers/Peri-Wound Care: Moisturizing lotion - both legs daily Edema Control: Avoid standing for long periods of time Elevate legs to the level of the heart or above for 30 minutes daily and/or when sitting, a frequency of: - throughout the day Support Garment 20-30 mm/Hg pressure to: - both legs daily, apply first thing in the morning, remove at night before bed 1. This patient swelling looks like lymphedema to me. I am not able to get a history out of this woman who set her edema been there for a week even when I pointed out that she was evaluated for this late last year by Dr. Oneida Alar there was no more history coming. 2. She has been prescribed medical grade compression before although she is not worn them. 3. I have given her her measurements for her legs and the phone number to order medical grade compression stockings 20/30 below-knee from [facility] in [place] 4. I have explained to her that she is certainly at risk for skin breakdown. She does not need to be followed here until this actually happens. 5. I have also told her to keep her legs elevated when she is sitting. The patient does not  need to be followed here. I spent 35 minutes in review of her past medical history face-to-face evaluation and preparation of this record Electronic Signature(s) Signed: 02/23/2020 5:03:49 PM By: Linton Ham MD Entered By: Linton Ham on 02/23/2020 10:30:53 -------------------------------------------------------------------------------- HxROS Details Patient Name: Date of Service: [name], [name] RIS M. 02/23/2020 9:00 A M Medical Record Number: [id number] Patient Account Number: [id number] Date of Birth/Sex: Treating RN: 1933-11-05 (86 y.o. Female) Carlene Coria Primary Care Provider: [name] Other Clinician: Referring Provider: Treating Provider/Extender: Linton Ham UPTO Tretha Sciara BETH Weeks in Treatment: 0 Information Obtained From Patient Constitutional  Symptoms (General Health) Complaints and Symptoms: Negative for: Fatigue; Fever; Chills; Marked Weight Change Eyes Complaints and Symptoms: Negative for: Dry Eyes; Vision Changes; Glasses / Contacts Medical History: Negative for: Cataracts; Glaucoma; Optic Neuritis Ear/Nose/Mouth/Throat Complaints and Symptoms: Negative for: Chronic sinus problems or rhinitis Medical History: Negative for: Chronic sinus problems/congestion; Middle ear problems Respiratory Complaints and Symptoms: Negative for: Chronic or frequent coughs; Shortness of Breath Medical History: Positive for: Asthma Negative for: Aspiration; Chronic Obstructive Pulmonary Disease (COPD); Pneumothorax; Sleep Apnea; Tuberculosis Cardiovascular Complaints and Symptoms: Negative for: Chest pain Medical History: Positive for: Hypertension Negative for: Angina; Arrhythmia; Congestive Heart Failure; Coronary Artery Disease; Deep Vein Thrombosis; Hypotension; Myocardial Infarction; Peripheral Arterial Disease; Peripheral Venous Disease; Phlebitis; Vasculitis Gastrointestinal Complaints and Symptoms: Negative for: Frequent diarrhea; Nausea; Vomiting Medical History: Negative for: Cirrhosis ; Colitis; Crohns; Hepatitis A; Hepatitis B; Hepatitis C Endocrine Complaints and Symptoms: Negative for: Heat/cold intolerance Medical History: Positive for: Type II Diabetes - 4 Negative for: Type I Diabetes Time with diabetes: 4 years Treated with: Diet Blood sugar tested every day: No Genitourinary Complaints and Symptoms: Negative for: Frequent urination Medical History: Negative for: End Stage Renal Disease Integumentary (Skin) Complaints and Symptoms: Positive for: Wounds Medical History: Negative for: History of Burn Musculoskeletal Complaints and Symptoms: Negative for: Muscle Pain; Muscle Weakness Medical History: Negative for: Gout; Rheumatoid Arthritis; Osteoarthritis; Osteomyelitis Neurologic Complaints and  Symptoms: Negative for: Numbness/parasthesias Medical History: Negative for: Dementia; Neuropathy; Quadriplegia; Paraplegia; Seizure Disorder Psychiatric Complaints and Symptoms: Negative for: Claustrophobia; Suicidal Medical History: Negative for: Anorexia/bulimia; Confinement Anxiety Hematologic/Lymphatic Medical History: Negative for: Anemia; Hemophilia; Human Immunodeficiency Virus; Lymphedema; Sickle Cell Disease Immunological Medical History: Negative for: Lupus Erythematosus; Raynauds; Scleroderma Oncologic Medical History: Negative for: Received Chemotherapy; Received Radiation Immunizations Pneumococcal Vaccine: Received Pneumococcal Vaccination: No Implantable Devices None Family and Social History Cancer: Yes - Mother,Father,Siblings; Diabetes: No; Heart Disease: No; Hereditary Spherocytosis: No; Hypertension: Yes - Mother,Father,Siblings; Kidney Disease: No; Lung Disease: Yes - Mother,Father,Siblings; Seizures: No; Stroke: No; Thyroid Problems: No; Tuberculosis: No; Never smoker; Marital Status - Single; Alcohol Use: Never; Drug Use: No History; Caffeine Use: Rarely; Financial Concerns: No; Food, Clothing or Shelter Needs: No; Support System Lacking: No; Transportation Concerns: No Electronic Signature(s) Signed: 02/23/2020 5:03:49 PM By: Linton Ham MD Signed: 02/24/2020 5:49:00 PM By: Carlene Coria RN Entered By: Carlene Coria on 02/23/2020 09:40:33 -------------------------------------------------------------------------------- SuperBill Details Patient Name: Date of Service: [name], [name] RIS M. 02/23/2020 Medical Record Number: [id number] Patient Account Number: [id number] Date of Birth/Sex: Treating RN: 1934/04/11 (86 y.o. Female) Levan Hurst Primary Care Provider: [name] Other Clinician: Referring Provider: Treating Provider/Extender: Linton Ham UPTO Tretha Sciara BETH Weeks in Treatment: 0 Diagnosis Coding ICD-10 Codes Code  Description I89.0 Lymphedema, not elsewhere classified I87.303 Chronic venous hypertension (idiopathic) without complications of bilateral lower extremity Facility Procedures CPT4 Code: [id number] Description: 99213 - WOUND CARE VISIT-LEV 3 EST PT Modifier: Quantity: 1 Physician Procedures :  CPT4 Code Description Modifier [id number] WC PHYS LEVEL 3 NEW PT ICD-10 Diagnosis Description I89.0 Lymphedema, not elsewhere classified I87.303 Chronic venous hypertension (idiopathic) without complications of bilateral lower extremity Quantity: 1 Electronic Signature(s) Signed: 02/23/2020 5:03:49 PM By: Linton Ham MD Entered By: Linton Ham on 02/23/2020 10:31:21

## 2020-02-24 NOTE — Progress Notes (Signed)
[name] ([id number]) , Visit Report for 02/23/2020 Allergy List Details Patient Name: Date of Service: [name], DO RIS M. 02/23/2020 9:00 A M Medical Record Number: [id number] Patient Account Number: [id number] Date of Birth/Sex: Treating RN: 22-Jul-1933 (86 y.o. Female) Yevonne Pax Primary Care Kendrew Paci: Berniece Andreas Other Clinician: Referring Sanjuanita Condrey: Treating Lizbeth Feijoo/Extender: Baltazar Najjar [facility] [place] B[place] Weeks in Treatment: 0 Allergies Active Allergies penicillin Allergy Notes Electronic Signature(s) Signed: 02/24/2020 5:49:00 PM By: Yevonne Pax RN Entered By: Yevonne Pax on 02/23/2020 09:29:18 -------------------------------------------------------------------------------- Arrival Information Details Patient Name: Date of Service: Angela Sheen, DO RIS M. 02/23/2020 9:00 A M Medical Record Number: [id number] Patient Account Number: [id number] Date of Birth/Sex: Treating RN: 03-11-1934 (86 y.o. Female) Yevonne Pax Primary Care Eugenia Eldredge: Berniece Andreas Other Clinician: Referring Carol Theys: Treating Anja Neuzil/Extender: Baltazar Najjar [facility] BETH Weeks in Treatment: 0 Visit Information Patient Arrived: Ambulatory Arrival Time: 09:23 Accompanied By: daughter Transfer Assistance: None Patient Identification Verified: Yes Secondary Verification Process Completed: Yes Patient Has Alerts: Yes Patient Alerts: Patient on Blood Thinner Electronic Signature(s) Signed: 02/24/2020 5:49:00 PM By: Yevonne Pax RN Entered By: Yevonne Pax on 02/23/2020 09:27:05 -------------------------------------------------------------------------------- Clinic Level of Care Assessment Details Patient Name: Date of Service: [name], DO RIS M. 02/23/2020 9:00 A M Medical Record Number: [id number] Patient Account Number: [id number] Date of Birth/Sex: Treating RN: 05-Sep-1933 (86 y.o. Female) Zandra Abts Primary Care Darius Fillingim: Berniece Andreas Other Clinician: Referring  Sumiko Ceasar: Treating Senai Kingsley/Extender: Baltazar Najjar [facility] [place] BETH Weeks in Treatment: 0 Clinic Level of Care Assessment Items TOOL 2 Quantity Score X- 1 0 Use when only an EandM is performed on the INITIAL visit ASSESSMENTS - Nursing Assessment / Reassessment X- 1 20 General Physical Exam (combine w/ comprehensive assessment (listed just below) when performed on new pt. evals) X- 1 25 Comprehensive Assessment (HX, ROS, Risk Assessments, Wounds Hx, etc.) ASSESSMENTS - Wound and Skin A ssessment / Reassessment []  - 0 Simple Wound Assessment / Reassessment - one wound []  - 0 Complex Wound Assessment / Reassessment - multiple wounds []  - 0 Dermatologic / Skin Assessment (not related to wound area) ASSESSMENTS - Ostomy and/or Continence Assessment and Care []  - 0 Incontinence Assessment and Management []  - 0 Ostomy Care Assessment and Management (repouching, etc.) PROCESS - Coordination of Care X - Simple Patient / Family Education for ongoing care 1 15 []  - 0 Complex (extensive) Patient / Family Education for ongoing care X- 1 10 Staff obtains Chiropractor, Records, T Results / Process Orders est []  - 0 Staff telephones HHA, Nursing Homes / Clarify orders / etc []  - 0 Routine Transfer to another Facility (non-emergent condition) []  - 0 Routine Hospital Admission (non-emergent condition) X- 1 15 New Admissions / Manufacturing engineer / Ordering NPWT Apligraf, etc. , []  - 0 Emergency Hospital Admission (emergent condition) X- 1 10 Simple Discharge Coordination []  - 0 Complex (extensive) Discharge Coordination PROCESS - Special Needs []  - 0 Pediatric / Minor Patient Management []  - 0 Isolation Patient Management []  - 0 Hearing / Language / Visual special needs []  - 0 Assessment of Community assistance (transportation, D/C planning, etc.) []  - 0 Additional assistance / Altered mentation []  - 0 Support Surface(s) Assessment (bed, cushion, seat,  etc.) INTERVENTIONS - Wound Cleansing / Measurement []  - 0 Wound Imaging (photographs - any number of wounds) []  - 0 Wound Tracing (instead of photographs) []  - 0 Simple Wound Measurement - one wound []  - 0 Complex Wound Measurement - multiple wounds []  - 0 Simple Wound Cleansing -  one wound []  - 0 Complex Wound Cleansing - multiple wounds INTERVENTIONS - Wound Dressings []  - 0 Small Wound Dressing one or multiple wounds []  - 0 Medium Wound Dressing one or multiple wounds []  - 0 Large Wound Dressing one or multiple wounds []  - 0 Application of Medications - injection INTERVENTIONS - Miscellaneous []  - 0 External ear exam []  - 0 Specimen Collection (cultures, biopsies, blood, body fluids, etc.) []  - 0 Specimen(s) / Culture(s) sent or taken to Lab for analysis []  - 0 Patient Transfer (multiple staff / Michiel Sites Lift / Similar devices) []  - 0 Simple Staple / Suture removal (25 or less) []  - 0 Complex Staple / Suture removal (26 or more) []  - 0 Hypo / Hyperglycemic Management (close monitor of Blood Glucose) []  - 0 Ankle / Brachial Index (ABI) - do not check if billed separately Has the patient been seen at the hospital within the last three years: Yes Total Score: 95 Level Of Care: New/Established - Level 3 Electronic Signature(s) Signed: 02/23/2020 5:10:40 PM By: Zandra Abts RN, BSN Entered By: Zandra Abts on 02/23/2020 10:20:04 -------------------------------------------------------------------------------- Lower Extremity Assessment Details Patient Name: Date of Service: [name], DO RIS M. 02/23/2020 9:00 A M Medical Record Number: [id number] Patient Account Number: [id number] Date of Birth/Sex: Treating RN: [birth year] (86 y.o. Female) Yevonne Pax Primary Care Audryana Hockenberry: Berniece Andreas Other Clinician: Referring Nysha Koplin: Treating Johara Lodwick/Extender: Baltazar Najjar [facility] [place] BETH Weeks in Treatment: 0 Edema Assessment Assessed: [Left: No] [Right:  No] [Left: Edema] [Right: :] Calf Left: Right: Point of Measurement: 43 cm From Medial Instep 39.5 cm 39 cm Ankle Left: Right: Point of Measurement: 10 cm From Medial Instep 30 cm 29 cm Electronic Signature(s) Signed: 02/24/2020 5:49:00 PM By: Yevonne Pax RN Entered By: Yevonne Pax on 02/23/2020 09:47:03 -------------------------------------------------------------------------------- Patient/Caregiver Education Details Patient Name: Date of Service: Angela Sheen DO RIS M. 10/11/2021andnbsp9:00 A M Medical Record Number: [id number] Patient Account Number: [id number] Date of Birth/Gender: Treating RN: [birth year] (86 y.o. Female) Zandra Abts Primary Care Physician: Berniece Andreas Other Clinician: Referring Physician: Treating Physician/Extender: Baltazar Najjar [facility] [place] BETH Weeks in Treatment: 0 Education Assessment Education Provided To: Patient Education Topics Provided Venous: Handouts: Controlling Swelling with Compression Stockings Methods: Explain/Verbal, Printed Responses: State content correctly Wound/Skin Impairment: Methods: Explain/Verbal Responses: State content correctly Electronic Signature(s) Signed: 02/23/2020 5:10:40 PM By: Zandra Abts RN, BSN Entered By: Zandra Abts on 02/23/2020 10:16:28 -------------------------------------------------------------------------------- Vitals Details Patient Name: Date of Service: Angela Sheen, DO RIS M. 02/23/2020 9:00 A M Medical Record Number: [id number] Patient Account Number: [id number] Date of Birth/Sex: Treating RN: 12-Mar-1934 (86 y.o. Female) Yevonne Pax Primary Care Adalin Vanderploeg: Berniece Andreas Other Clinician: Referring Korinne Greenstein: Treating Catha Ontko/Extender: Baltazar Najjar [facility] [place] BETH Weeks in Treatment: 0 Vital Signs Time Taken: 09:27 Temperature (F): 98.7 Height (in): 61 Pulse (bpm): 85 Source: Stated Respiratory Rate (breaths/min): 18 Weight (lbs): 170 Blood Pressure (mmHg):  175/70 Source: Stated Reference Range: 80 - 120 mg / dl Body Mass Index (BMI): 32.1 Electronic Signature(s) Signed: 02/24/2020 5:49:00 PM By: Yevonne Pax RN Entered By: Yevonne Pax on 02/23/2020 53:66:44

## 2020-02-24 NOTE — Progress Notes (Signed)
ANUHEA GASSNER ([id number]) , Visit Report for 02/23/2020 Abuse/Suicide Risk Screen Details Patient Name: Date of Service: [place], DO RIS M. 02/23/2020 9:00 A M Medical Record Number: [id number] Patient Account Number: [id number] Date of Birth/Sex: Treating RN: 1933/06/05 (86 y.o. Female) Carlene Coria Primary Care Shaden Higley: [name] Other Clinician: Referring Ishaaq Penna: Treating Kairen Hallinan/Extender: Linton Ham UPTO Tretha Sciara BETH Weeks in Treatment: 0 Abuse/Suicide Risk Screen Items Answer ABUSE RISK SCREEN: Has anyone close to you tried to hurt or harm you recentlyo No Do you feel uncomfortable with anyone in your familyo No Has anyone forced you do things that you didnt want to doo No Electronic Signature(s) Signed: 02/24/2020 5:49:00 PM By: Carlene Coria RN Entered By: Carlene Coria on 02/23/2020 09:40:44 -------------------------------------------------------------------------------- Activities of Daily Living Details Patient Name: Date of Service: [place], DO RIS M. 02/23/2020 9:00 A M Medical Record Number: [id number] Patient Account Number: [id number] Date of Birth/Sex: Treating RN: 1933-07-07 (86 y.o. Female) Carlene Coria Primary Care Gwendola Hornaday: [name] Other Clinician: Referring Nuala Chiles: Treating Gerell Fortson/Extender: Linton Ham UPTO Tretha Sciara BETH Weeks in Treatment: 0 Activities of Daily Living Items Answer Activities of Daily Living (Please select one for each item) Drive Automobile Completely Able T Medications ake Completely Able Use T elephone Completely Able Care for Appearance Completely Able Use T oilet Completely Able Bath / Shower Completely Able Dress Self Completely Able Feed Self Completely Able Walk Completely Able Get In / Out Bed Completely Able Housework Completely Able Prepare Meals Completely [place] Completely Able Shop for Self Completely Able Electronic Signature(s) Signed: 02/24/2020 5:49:00 PM By: Carlene Coria  RN Entered By: Carlene Coria on 02/23/2020 09:41:12 -------------------------------------------------------------------------------- Education Screening Details Patient Name: Date of Service: Altha Harm, DO RIS M. 02/23/2020 9:00 A M Medical Record Number: [id number] Patient Account Number: [id number] Date of Birth/Sex: Treating RN: [birth year] (86 y.o. Female) Carlene Coria Primary Care Avaiyah Strubel: [name] Other Clinician: Referring Zeb Rawl: Treating Banesa Tristan/Extender: Linton Ham UPTO Tretha Sciara BETH Weeks in Treatment: 0 Learning Preferences/Education Level/Primary Language Learning Preference: Explanation Highest Education Level: High School Preferred Language: English Cognitive Barrier Language Barrier: No Translator Needed: No Memory Deficit: No Emotional Barrier: No Cultural/Religious Beliefs Affecting Medical Care: No Physical Barrier Impaired Vision: No Impaired Hearing: No Decreased Hand dexterity: No Knowledge/Comprehension Knowledge Level: Medium Comprehension Level: High Ability to understand written instructions: High Ability to understand verbal instructions: High Motivation Anxiety Level: Anxious Cooperation: Cooperative Education Importance: Acknowledges Need Interest in Health Problems: Asks Questions Perception: Coherent Willingness to Engage in Self-Management High Activities: Readiness to Engage in Self-Management High Activities: Electronic Signature(s) Signed: 02/24/2020 5:49:00 PM By: Carlene Coria RN Entered By: Carlene Coria on 02/23/2020 09:42:30 -------------------------------------------------------------------------------- Fall Risk Assessment Details Patient Name: Date of Service: [place], DO RIS M. 02/23/2020 9:00 A M Medical Record Number: [id number] Patient Account Number: [id number] Date of Birth/Sex: Treating RN: [birth year] (86 y.o. Female) Carlene Coria Primary Care Naziah Weckerly: [name] Other Clinician: Referring  Gila Lauf: Treating Jamare Vanatta/Extender: Linton Ham UPTO Tretha Sciara BETH Weeks in Treatment: 0 Fall Risk Assessment Items Have you had 2 or more falls in the last 12 monthso 0 No Have you had any fall that resulted in injury in the last 12 monthso 0 No FALLS RISK SCREEN History of falling - immediate or within 3 months 0 No Secondary diagnosis (Do you have 2 or more medical diagnoseso) 0 No Ambulatory aid None/bed rest/wheelchair/nurse 0 No Crutches/cane/walker 0 No Furniture 0 No Intravenous therapy Access/Saline/Heparin Lock 0 No Gait/Transferring Normal/ bed rest/ wheelchair 0 No Weak (  short steps with or without shuffle, stooped but able to lift head while walking, may seek 0 No support from furniture) Impaired (short steps with shuffle, may have difficulty arising from chair, head down, impaired 0 No balance) Mental Status Oriented to own ability 0 No Electronic Signature(s) Signed: 02/24/2020 5:49:00 PM By: Carlene Coria RN Entered By: Carlene Coria on 02/23/2020 09:42:52 -------------------------------------------------------------------------------- Foot Assessment Details Patient Name: Date of Service: Altha Harm, DO RIS M. 02/23/2020 9:00 A M Medical Record Number: [id number] Patient Account Number: [id number] Date of Birth/Sex: Treating RN: 1933/10/14 (86 y.o. Female) Carlene Coria Primary Care Natalina Wieting: [name] Other Clinician: Referring Denzel Etienne: Treating Korianna Washer/Extender: Linton Ham UPTO Tretha Sciara BETH Weeks in Treatment: 0 Foot Assessment Items Site Locations + = Sensation present, - = Sensation absent, C = Callus, U = Ulcer R = Redness, W = Warmth, M = Maceration, PU = Pre-ulcerative lesion F = Fissure, S = Swelling, D = Dryness Assessment Right: Left: Other Deformity: No No Prior Foot Ulcer: No No Prior Amputation: No No Charcot Joint: No No Ambulatory Status: Ambulatory Without Help Gait: Steady Electronic Signature(s) Signed: 02/24/2020  5:49:00 PM By: Carlene Coria RN Entered By: Carlene Coria on 02/23/2020 09:46:19 -------------------------------------------------------------------------------- Nutrition Risk Screening Details Patient Name: Date of Service: Raffel, DO RIS M. 02/23/2020 9:00 A M Medical Record Number: [id number] Patient Account Number: [id number] Date of Birth/Sex: Treating RN: April 19, 1934 (86 y.o. Female) Carlene Coria Primary Care Bryston Colocho: [name] Other Clinician: Referring Betti Goodenow: Treating Mallissa Lorenzen/Extender: Linton Ham UPTO Tretha Sciara BETH Weeks in Treatment: 0 Height (in): 61 Weight (lbs): 170 Body Mass Index (BMI): 32.1 Nutrition Risk Screening Items Score Screening NUTRITION RISK SCREEN: I have an illness or condition that made me change the kind and/or amount of food I eat 0 No I eat fewer than two meals per day 0 No I eat few fruits and vegetables, or milk products 0 No I have three or more drinks of beer, liquor or wine almost every day 0 No I have tooth or mouth problems that make it hard for me to eat 0 No I don't always have enough money to buy the food I need 0 No I eat alone most of the time 0 No I take three or more different prescribed or over-the-counter drugs a day 1 Yes Without wanting to, I have lost or gained 10 pounds in the last six months 0 No I am not always physically able to shop, cook and/or feed myself 0 No Nutrition Protocols Good Risk Protocol 0 No interventions needed Moderate Risk Protocol High Risk Proctocol Risk Level: Good Risk Score: 1 Electronic Signature(s) Signed: 02/24/2020 5:49:00 PM By: Carlene Coria RN Entered By: Carlene Coria on 02/23/2020 09:43:18

## 2020-02-27 DIAGNOSIS — N2581 Secondary hyperparathyroidism of renal origin: Secondary | ICD-10-CM | POA: Diagnosis not present

## 2020-02-27 DIAGNOSIS — N189 Chronic kidney disease, unspecified: Secondary | ICD-10-CM | POA: Diagnosis not present

## 2020-02-27 DIAGNOSIS — I8393 Asymptomatic varicose veins of bilateral lower extremities: Secondary | ICD-10-CM | POA: Diagnosis not present

## 2020-02-27 DIAGNOSIS — N184 Chronic kidney disease, stage 4 (severe): Secondary | ICD-10-CM | POA: Diagnosis not present

## 2020-02-27 DIAGNOSIS — D631 Anemia in chronic kidney disease: Secondary | ICD-10-CM | POA: Diagnosis not present

## 2020-02-27 DIAGNOSIS — I129 Hypertensive chronic kidney disease with stage 1 through stage 4 chronic kidney disease, or unspecified chronic kidney disease: Secondary | ICD-10-CM | POA: Diagnosis not present

## 2020-02-27 NOTE — Telephone Encounter (Signed)
Dr. Regis Bill verbally okayed the pharmacy to refill   potassium chloride (KLOR-CON) 10 MEQ tablet -- For 90 days  montelukast (SINGULAIR) 10 MG tablet -- For 6 months   Nothing further needed

## 2020-03-01 ENCOUNTER — Other Ambulatory Visit: Payer: Self-pay | Admitting: Internal Medicine

## 2020-03-03 NOTE — Telephone Encounter (Signed)
Called pt lmom for pt to call the office to get an appointment scheduled.

## 2020-03-09 NOTE — Progress Notes (Signed)
Chief Complaint  Patient presents with  . Medication Management    needs med refills, and labs per patient. wants to dicuss pnuemonia vaccine    HPI: [name] 86 y.o. come in for Chronic disease management  Doing ok  Got over covid  Aft [facility] infusion  Antibiotic and prednisone   Ok now. Edema a bit better after dec dose of amlodipine  Cards  And to stay on cardura  Needs refill of dec dose of citalopram  For anxiety stable. Would like sugar checked.  Followed dr Hollie Salk Has ongoing anemia   Heme  ? Whether  Infusion would be risky vs  Helpful   Who is to follow ?  ROS: See pertinent positives and negatives per HPI.  Past Medical History:  Diagnosis Date  . Acute superficial venous thrombosis of left lower extremity 03/27/2014   concern about hx and potential extensive on exam tenderness calf and low dose lovenox 40 qd 2-4 weekselevetion and close  fu advised .   [name] Allergy   . Anemia   . Anxiety   . Arthritis    "shoulders" (09/09/2012)  . Asthma 09/08/2012  . Carotid bruit    [place] 2018  low risk 1 - 39%  . Diabetes mellitus without complication ([place])    "Borderline" per pt; being monitored.  no meds per pt  . Dyspnea 04/27/2014  . GERD (gastroesophageal reflux disease)   . Headache(784.0)    "related to my high blood pressure" (09/09/2012)  . History of DVT (deep vein thrombosis)    "RLE" (09/09/2012) coumadine cant take asa so on plavix   . HTN (hypertension) 09/08/2012  . Hyperlipidemia   . Lupus ([place])    "cured years ago" (09/09/2012)  . Other dysphagia 02/11/2013  . Syncope and collapse 01/21/2018  . Varicose veins   . Varicose veins of leg with complications 7/51/0258    Family History  Problem Relation Age of Onset  . Hypertension Mother   . Breast cancer Mother   . Hypertension Father   . Cancer Sister        Colon and Breast Cancer  . Breast cancer Sister   . Colon cancer Sister        ? 4' s dx - died in 62's  . Hypertension Brother   . Rectal  cancer Brother   . Stomach cancer Brother   . Breast cancer Brother   . Heart attack Daughter   . Esophageal cancer Daughter   . Lung cancer Other        both parents   . Breast cancer Paternal Aunt     Social History   Socioeconomic History  . Marital status: Single    Spouse name: Not on file  . Number of children: 6  . Years of education: Not on file  . Highest education level: Not on file  Occupational History  . Not on file  Tobacco Use  . Smoking status: Never Smoker  . Smokeless tobacco: Never Used  Vaping Use  . Vaping Use: Never used  Substance and Sexual Activity  . Alcohol use: No  . Drug use: No  . Sexual activity: Not Currently    Birth control/protection: Surgical  Other Topics Concern  . Not on file  Social History Narrative   2 people living in the home.  Grand daughter.   Up and down through the night      Had 7 children  2 deceased . 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    works 3 days per week currently .   Glasses dentures  Neg tad    Social Determinants of Health   Financial Resource Strain: Low Risk   . Difficulty of Paying Living Expenses: Not hard at all  Food Insecurity:   . Worried About Charity fundraiser in the Last Year: Not on file  . Ran Out of Food in the Last Year: Not on file  Transportation Needs: No Transportation Needs  . Lack of Transportation (Medical): No  . Lack of Transportation (Non-Medical): No  Physical Activity:   . Days of Exercise per Week: Not on file  . Minutes of Exercise per Session: Not on file  Stress:   . Feeling of Stress : Not on file  Social Connections:   . Frequency of Communication with Friends and Family: Not on file  . Frequency of Social Gatherings with Friends and Family: Not on file  . Attends Religious Services: Not on file  . Active Member of Clubs or Organizations: Not on file  . Attends Archivist  Meetings: Not on file  . Marital Status: Not on file    Outpatient Medications Prior to Visit  Medication Sig Dispense Refill  . acetaminophen (TYLENOL) 500 MG tablet Take 1,000 mg by mouth every 6 (six) hours as needed for moderate pain. Reported on 04/30/2015    . albuterol (PROVENTIL) (2.5 MG/3ML) 0.083% nebulizer solution USE 1 AMPULE EVERY 6 HOURS AS NEEDED FOR WHEEZE 75 mL 6  . albuterol (VENTOLIN HFA) 108 (90 Base) MCG/ACT inhaler USE 2 PUFFS EVERY 6 HOURS AS NEEDED FOR WHEEZING. 8.5 g 0  . amLODipine (NORVASC) 5 MG tablet Take 1 tablet (5 mg total) by mouth daily. 90 tablet 3  . atorvastatin (LIPITOR) 20 MG tablet TAKE 1 TABLET ONCE DAILY. 90 tablet 1  . Azelastine-Fluticasone (DYMISTA) 137-50 MCG/ACT SUSP Place 1 spray into the nose in the morning and at bedtime. 23 g 6  . Budeson-Glycopyrrol-Formoterol (BREZTRI AEROSPHERE) 160-9-4.8 MCG/ACT AERO Inhale 2 puffs into the lungs in the morning and at bedtime. 5.9 g 5  . carvedilol (COREG) 6.25 MG tablet Take 6.25 mg by mouth daily.    . clopidogrel (PLAVIX) 75 MG tablet TAKE 1 TABLET ONCE DAILY. 30 tablet 2  . diclofenac sodium (VOLTAREN) 1 % GEL APPLY 4GM TO AFFECTED AREA(S) 4 TIMES A DAY. 100 g 0  . doxazosin (CARDURA) 2 MG tablet TAKE 3 TABLETS AT BEDTIME. 90 tablet 0  . ferrous sulfate 325 (65 FE) MG EC tablet Take 1 tablet (325 mg total) by mouth daily with breakfast. 30 tablet 3  . fluorometholone (FML) 0.1 % ophthalmic suspension Place 1 drop into both eyes 2 (two) times daily.     . fluticasone (FLONASE) 50 MCG/ACT nasal spray Place 2 sprays into both nostrils daily. 16 g 2  . Fluticasone-Salmeterol (ADVAIR) 250-50 MCG/DOSE AEPB USE 1 PUFF TWICE DAILY. 60 each 6  . furosemide (LASIX) 40 MG tablet Take 80 mg by mouth 3 (three) times daily.     . hydrALAZINE (APRESOLINE) 50 MG tablet Take 50 mg by mouth 3 (three) times daily.    [name] HYDROcodone-acetaminophen (NORCO/VICODIN) 5-325 MG tablet Take 1 tablet by mouth every 12 (twelve)  hours as needed for moderate pain. 30 tablet 0  . lactobacillus acidophilus & bulgar (LACTINEX) chewable tablet Chew 1 tablet by mouth  3 (three) times daily with meals. 30 tablet 0  . lidocaine (LIDODERM) 5 % Place 1 patch onto the skin daily. Remove & Discard patch within 12 hours or as directed by MD 30 patch 0  . loratadine (CLARITIN) 10 MG tablet Take 1 tablet (10 mg total) by mouth daily. (Patient taking differently: Take 10 mg by mouth daily as needed. ) 30 tablet 11  . LORazepam (ATIVAN) 1 MG tablet TAKE 1 TABLET EVERY 8 HOURS AS NEEDED FOR ANXIETY. AVOID REGULAR USE. 24 tablet 0  . montelukast (SINGULAIR) 10 MG tablet TAKE 1 TABLET ONCE DAILY. 90 tablet 0  . ondansetron (ZOFRAN ODT) 4 MG disintegrating tablet Take 1 tablet (4 mg total) by mouth every 8 (eight) hours as needed for nausea or vomiting. 20 tablet 0  . pantoprazole (PROTONIX) 40 MG tablet TAKE (1) TABLET TWICE A DAY BEFORE MEALS. 180 tablet 3  . potassium chloride (KLOR-CON) 10 MEQ tablet TAKE 4 TABLETS DAILY. 120 tablet 0  . RESTASIS 0.05 % ophthalmic emulsion Place 1 drop into both eyes 2 (two) times daily.     [name] Spacer/Aero-Holding Chambers (AEROCHAMBER PLUS) inhaler Use as instructed 1 each 2  . vitamin B-12 (CYANOCOBALAMIN) 1000 MCG tablet Take 1,000 mcg by mouth daily.    . Vitamin D, Cholecalciferol, 25 MCG (1000 UT) TABS Take 1,000 Units by mouth daily.    [name] azithromycin (ZITHROMAX) 250 MG tablet Take 2 tablets today, then 1 daily until gone. 6 tablet 0  . citalopram (CELEXA) 10 MG tablet TAKE 1 TABLET ONCE DAILY. 30 tablet 0  . predniSONE (DELTASONE) 10 MG tablet 40mg X3 days, 30mg  X3 days, 20mg  X3 days, 10mg X3 days, then stop. 30 tablet 0  . acetaminophen-codeine (TYLENOL #3) 300-30 MG tablet Take 1 tablet by mouth daily as needed for moderate pain. (Patient not taking: Reported on 03/10/2020) 20 tablet 0   No facility-administered medications prior to visit.     EXAM:  BP (!) 152/78 (BP Location: Right Arm,  Patient Position: Sitting, Cuff Size: Normal)   Pulse 60   Temp 98.1 F (36.7 C) (Oral)   Ht 5\' 1"  (1.549 m)   Wt 174 lb (78.9 kg)   SpO2 99%   BMI 32.88 kg/m   Body mass index is 32.88 kg/m.  GENERAL: vitals reviewed and listed above, alert, oriented, appears well hydrated and in no acute distress HEENT: atraumatic, conjunctiva  clear, no obvious abnormalities on inspection of external nose and ears OP :masked  NECK: no obvious masses on inspection palpation  LUNGS: clear to auscultation bilaterally, no wheezes, rales or rhonchi, good air movement CV: HRRR, no clubbing cyanosis or 2+ edema compression stockings  nl cap refill  MS: moves all extremities without noticeable focal  Abnormality mildly antalgic gait   Skin no bruising or acute changes  PSYCH: pleasant and cooperative, no obvious depression or anxiety Lab Results  Component Value Date   WBC 4.8 03/10/2020   HGB 9.4 (L) 03/10/2020   HCT 28.7 (L) 03/10/2020   PLT 269 03/10/2020   GLUCOSE 112 (H) 03/10/2020   CHOL 174 09/19/2019   TRIG 100.0 09/19/2019   HDL 57.50 09/19/2019   LDLCALC 97 09/19/2019   ALT 6 09/19/2019   AST 16 09/19/2019   NA 143 03/10/2020   K 4.2 03/10/2020   CL 104 03/10/2020   CREATININE 2.31 (H) 03/10/2020   BUN 33 (H) 03/10/2020   CO2 25 03/10/2020   TSH 2.34 09/19/2019   INR 0.94 12/22/2009  HGBA1C 5.8 (H) 03/10/2020   MICROALBUR 10.7 (H) 04/25/2016   BP Readings from Last 3 Encounters:  03/10/20 (!) 152/78  01/26/20 128/70  11/24/19 130/62   Lab Results  Component Value Date   VITAMINB12 1,620 (H) 09/08/2012    ASSESSMENT AND PLAN:  Discussed the following assessment and plan:  Medication management - Plan: BASIC METABOLIC PANEL WITH GFR, CBC with Differential/Platelet, Hemoglobin A1c, Hemoglobin A1c, CBC with Differential/Platelet, BASIC METABOLIC PANEL WITH GFR, CANCELED: IBC + Ferritin, CANCELED: IBC + Ferritin  Chronic kidney disease (CKD), stage IV (severe) ([place]) -  Plan: BASIC METABOLIC PANEL WITH GFR, CBC with Differential/Platelet, CBC with Differential/Platelet, BASIC METABOLIC PANEL WITH GFR, CANCELED: IBC + Ferritin, CANCELED: IBC + Ferritin  Essential hypertension - Plan: BASIC METABOLIC PANEL WITH GFR, CBC with Differential/Platelet, CBC with Differential/Platelet, BASIC METABOLIC PANEL WITH GFR, CANCELED: IBC + Ferritin, CANCELED: IBC + Ferritin  Fasting hyperglycemia - Plan: BASIC METABOLIC PANEL WITH GFR, CBC with Differential/Platelet, Hemoglobin A1c, Hemoglobin A1c, CBC with Differential/Platelet, BASIC METABOLIC PANEL WITH GFR, CANCELED: IBC + Ferritin, CANCELED: IBC + Ferritin  Anemia, chronic disease - Plan: BASIC METABOLIC PANEL WITH GFR, CBC with Differential/Platelet, CBC with Differential/Platelet, BASIC METABOLIC PANEL WITH GFR, Iron, TIBC and Ferritin Panel, Iron, TIBC and Ferritin Panel, CANCELED: IBC + Ferritin, CANCELED: IBC + Ferritin  Anxiety state  Need for 23-polyvalent pneumococcal polysaccharide vaccine - Plan: Pneumococcal polysaccharide vaccine 23-valent greater than or equal to 2yo subcutaneous/IM  History of 2019 novel coronavirus disease (COVID-19) - hsa done well  so far   Asked for another pneumovax 23   Disc not usually indicated but  Should be safe and may help not tested this way  Looks well today   And no flare of pulmonary  At this time (-Patient advised to return or notify health care team  if  new concerns arise.  Patient Instructions  Getting another pneumovax 23 . Since you had covid and antibody infusion would wait on a booster  For 6 months from the disease unless newer   Updated information  Available.   Will refill citalopram 10 mg .  Will get results to dr Hollie Salk about the blood count depending  We may need to get you back to  Hematology  For help with the anemia.       Standley Brooking. Ernestina Joe M.D.

## 2020-03-10 ENCOUNTER — Encounter: Payer: Self-pay | Admitting: Internal Medicine

## 2020-03-10 ENCOUNTER — Other Ambulatory Visit: Payer: Self-pay

## 2020-03-10 ENCOUNTER — Ambulatory Visit (INDEPENDENT_AMBULATORY_CARE_PROVIDER_SITE_OTHER): Payer: Medicare Other | Admitting: Internal Medicine

## 2020-03-10 VITALS — BP 152/78 | HR 60 | Temp 98.1°F | Ht 61.0 in | Wt 174.0 lb

## 2020-03-10 DIAGNOSIS — R7301 Impaired fasting glucose: Secondary | ICD-10-CM

## 2020-03-10 DIAGNOSIS — F411 Generalized anxiety disorder: Secondary | ICD-10-CM

## 2020-03-10 DIAGNOSIS — Z23 Encounter for immunization: Secondary | ICD-10-CM

## 2020-03-10 DIAGNOSIS — I10 Essential (primary) hypertension: Secondary | ICD-10-CM

## 2020-03-10 DIAGNOSIS — D638 Anemia in other chronic diseases classified elsewhere: Secondary | ICD-10-CM | POA: Diagnosis not present

## 2020-03-10 DIAGNOSIS — Z79899 Other long term (current) drug therapy: Secondary | ICD-10-CM | POA: Diagnosis not present

## 2020-03-10 DIAGNOSIS — Z8616 Personal history of COVID-19: Secondary | ICD-10-CM

## 2020-03-10 DIAGNOSIS — N184 Chronic kidney disease, stage 4 (severe): Secondary | ICD-10-CM | POA: Diagnosis not present

## 2020-03-10 MED ORDER — CITALOPRAM HYDROBROMIDE 10 MG PO TABS
10.0000 mg | ORAL_TABLET | Freq: Every day | ORAL | 1 refills | Status: DC
Start: 2020-03-10 — End: 2020-09-21

## 2020-03-10 NOTE — Patient Instructions (Signed)
Getting another pneumovax 23 . Since you had covid and antibody infusion would wait on a booster  For 6 months from the disease unless newer   Updated information  Available.   Will refill citalopram 10 mg .  Will get results to dr Hollie Salk about the blood count depending  We may need to get you back to  Hematology  For help with the anemia.

## 2020-03-11 LAB — BASIC METABOLIC PANEL WITH GFR
BUN/Creatinine Ratio: 14 (calc) (ref 6–22)
BUN: 33 mg/dL — ABNORMAL HIGH (ref 7–25)
CO2: 25 mmol/L (ref 20–32)
Calcium: 9.6 mg/dL (ref 8.6–10.4)
Chloride: 104 mmol/L (ref 98–110)
Creat: 2.31 mg/dL — ABNORMAL HIGH (ref 0.60–0.88)
GFR, Est African American: 22 mL/min/{1.73_m2} — ABNORMAL LOW (ref >=60)
GFR, Est Non African American: 19 mL/min/{1.73_m2} — ABNORMAL LOW (ref >=60)
Glucose, Bld: 112 mg/dL — ABNORMAL HIGH (ref 65–99)
Potassium: 4.2 mmol/L (ref 3.5–5.3)
Sodium: 143 mmol/L (ref 135–146)

## 2020-03-11 LAB — CBC WITH DIFFERENTIAL/PLATELET
Absolute Monocytes: 456 cells/uL (ref 200–950)
Basophils Absolute: 29 cells/uL (ref 0–200)
Basophils Relative: 0.6 %
Eosinophils Absolute: 101 cells/uL (ref 15–500)
Eosinophils Relative: 2.1 %
HCT: 28.7 % — ABNORMAL LOW (ref 35.0–45.0)
Hemoglobin: 9.4 g/dL — ABNORMAL LOW (ref 11.7–15.5)
Lymphs Abs: 1162 cells/uL (ref 850–3900)
MCH: 29.2 pg (ref 27.0–33.0)
MCHC: 32.8 g/dL (ref 32.0–36.0)
MCV: 89.1 fL (ref 80.0–100.0)
MPV: 10.9 fL (ref 7.5–12.5)
Monocytes Relative: 9.5 %
Neutro Abs: 3053 cells/uL (ref 1500–7800)
Neutrophils Relative %: 63.6 %
Platelets: 269 10*3/uL (ref 140–400)
RBC: 3.22 10*6/uL — ABNORMAL LOW (ref 3.80–5.10)
RDW: 13.8 % (ref 11.0–15.0)
Total Lymphocyte: 24.2 %
WBC: 4.8 10*3/uL (ref 3.8–10.8)

## 2020-03-11 LAB — IRON,TIBC AND FERRITIN PANEL
%SAT: 30 % (calc) (ref 16–45)
Ferritin: 116 ng/mL (ref 16–288)
Iron: 77 ug/dL (ref 45–160)
TIBC: 257 mcg/dL (calc) (ref 250–450)

## 2020-03-11 LAB — HEMOGLOBIN A1C
Hgb A1c MFr Bld: 5.8 % of total Hgb — ABNORMAL HIGH (ref <5.7)
Mean Plasma Glucose: 120 (calc)
eAG (mmol/L): 6.6 (calc)

## 2020-03-12 ENCOUNTER — Encounter: Payer: Self-pay | Admitting: Internal Medicine

## 2020-03-12 NOTE — Progress Notes (Signed)
Blood result stable ,blood sugar   no diabetes  Anemia the same but nl iron levels .  Poss from the  decrease kidney function  Will send results to  dr Hollie Salk and  hematology

## 2020-03-29 ENCOUNTER — Other Ambulatory Visit: Payer: Self-pay | Admitting: Internal Medicine

## 2020-03-31 ENCOUNTER — Other Ambulatory Visit: Payer: Self-pay | Admitting: Emergency Medicine

## 2020-05-09 ENCOUNTER — Encounter: Payer: Self-pay | Admitting: Orthopedic Surgery

## 2020-05-10 ENCOUNTER — Telehealth: Payer: Self-pay

## 2020-05-10 NOTE — Telephone Encounter (Signed)
I called and left a message on [facility] voice mail (the patient's contact): appointment has been made with [facility] on 05/13/20 at 2:30. Advised her to call us back if she needs to change this or has any concerns/questions.

## 2020-05-13 ENCOUNTER — Ambulatory Visit (INDEPENDENT_AMBULATORY_CARE_PROVIDER_SITE_OTHER): Payer: Medicare Other | Admitting: Surgical

## 2020-05-13 ENCOUNTER — Other Ambulatory Visit: Payer: Self-pay

## 2020-05-13 DIAGNOSIS — M5410 Radiculopathy, site unspecified: Secondary | ICD-10-CM

## 2020-05-14 DIAGNOSIS — H4710 Unspecified papilledema: Secondary | ICD-10-CM | POA: Diagnosis not present

## 2020-05-14 DIAGNOSIS — H40023 Open angle with borderline findings, high risk, bilateral: Secondary | ICD-10-CM | POA: Diagnosis not present

## 2020-05-14 DIAGNOSIS — H2000 Unspecified acute and subacute iridocyclitis: Secondary | ICD-10-CM | POA: Diagnosis not present

## 2020-05-15 ENCOUNTER — Encounter: Payer: Self-pay | Admitting: Surgical

## 2020-05-15 MED ORDER — HYDROCODONE-ACETAMINOPHEN 5-325 MG PO TABS
1.0000 | ORAL_TABLET | Freq: Every day | ORAL | 0 refills | Status: DC | PRN
Start: 2020-05-15 — End: 2021-04-25

## 2020-05-15 NOTE — Progress Notes (Signed)
Office Visit Note   Patient: [name]           Date of Birth: Apr 24, 1934           MRN: [id number] Visit Date: 05/13/2020 Requested by: Burnis Medin, MD [place],  [place] [id number] PCP: Burnis Medin, MD  Subjective: Chief Complaint  Patient presents with  . Lower Back - Pain    HPI: [name] is a 87 y.o. female who presents to the office complaining of low back pain with left leg pain.  Patient describes pain over the last 1 to 2 months that came on without injury.  She has a history of similar pain that was treated successfully with epidural steroid injections in the past.  Patient describes left-sided low back pain with radicular pain down her lateral/posterior left leg to her foot.  She notes subjective weakness of the left leg.  This pain wakes her up at night.  Denies any numbness/tingling.  Makes it difficult to walk at times due to her pain.  Denies any bowel/bladder incontinence or saddle anesthesia.  Denies any groin pain.  She does take Plavix.  She had ESI by Dr. Ernestina Patches on 10/22/2019 that provided 100% relief at that time and she states that she was doing well in regards to her pain until the last month or 2.  She had left paramedian epidural steroid injection at L5-S1 by Dr. Ernestina Patches..                ROS: All systems reviewed are negative as they relate to the chief complaint within the history of present illness.  Patient denies fevers or chills.  Assessment & Plan: Visit Diagnoses:  1. Radicular syndrome of left leg     Plan: Patient is an 87 year old female who presents complaining of left lumbar back pain and left leg radiculopathy.  She states that this feels exactly the same as the previous pain she had earlier this year.  She has history of chronic bilateral facet arthropathy at L4-L5 with disc bulging as well as chronic disc degeneration with narrowing of the lateral recesses and foramina at L5-S1.  Last MRI detailing these  changes was in July 2017.  She had epidural steroid injection by Dr. Ernestina Patches that provided on percent relief for several weeks and then fairly good relief up until 1 to 2 months ago.  His note states that he did a left paramedian epidural steroid injection at L5-S1.  She is on Plavix.  Referred to Dr. Ernestina Patches for repeat ESI.  Follow-up as needed.  Follow-Up Instructions: No follow-ups on file.   Orders:  Orders Placed This Encounter  Procedures  . Ambulatory referral to Physical Medicine Rehab   No orders of the defined types were placed in this encounter.     Procedures: No procedures performed   Clinical Data: No additional findings.  Objective: Vital Signs: There were no vitals taken for this visit.  Physical Exam:  Constitutional: Patient appears well-developed HEENT:  Head: Normocephalic Eyes:EOM are normal Neck: Normal range of motion Cardiovascular: Normal rate Pulmonary/chest: Effort normal Neurologic: Patient is alert Skin: Skin is warm Psychiatric: Patient has normal mood and affect  Ortho Exam: Ortho exam demonstrates low back with tenderness throughout the axial lumbar spine and left-sided paraspinal musculature.  5/5 motor strength of the bilateral hip flexors, quadricep, hamstring, dorsiflexion, plantarflexion.  Decreased sensation through medial and lateral calf and dorsal foot of left lower extremity but normal  sensation throughout the rest of the left leg compared to the contralateral side.  No clonus bilaterally.  No pain with hip range of motion.  Negative Stinchfield exam.  Specialty Comments:  No specialty comments available.  Imaging: No results found.   PMFS History: Patient Active Problem List   Diagnosis Date Noted  . Chronic rhinitis 11/24/2019  . Nausea 11/03/2018  . Elevated troponin 11/03/2018  . Hyponatremia 11/03/2018  . Hypertensive heart disease with hypertensive chronic kidney disease 11/30/2017  . Fasting hyperglycemia 11/30/2017   . Renal cyst 03/27/2016  . CKD (chronic kidney disease) stage 3, GFR 30-59 ml/min ([facility]) 08/23/2015  . Perceived hearing changes 06/29/2014  . Resistant hypertension 06/29/2014  . Lumbago 06/15/2014  . Pre-diabetes vs early DM  06/15/2014  . Dyspnea 04/27/2014  . Asthma, chronic 04/13/2014  . Elevated uric acid in blood 04/13/2014  . Essential hypertension 03/27/2014  . History of DVT (deep vein thrombosis)   . Palpitations 01/05/2014  . Anemia of chronic disease January 04, 2014  . Death of family member 2013/09/08  . Leg edema 06/20/2013  . Bereavement due to life event 04/21/2013  . Other dysphagia 02/11/2013  . Colon cancer screening 02/11/2013  . Anxiety state 01/20/2013  . Leg cramps 01/20/2013  . Back pain 12/05/2012  . Family hx of colon cancer 10/21/2012  . Family hx of lung cancer 10/21/2012  . Family hx-breast malignancy 10/21/2012  . Renal insufficiency creatinine 1.3 10/21/2012  . Anemia, chronic disease 10/21/2012  . GERD (gastroesophageal reflux disease) 09/08/2012  . HTN (hypertension) 09/08/2012  . Hyperlipidemia 09/08/2012  . Varicose veins of leg with complications 05/03/7587   Past Medical History:  Diagnosis Date  . Acute superficial venous thrombosis of left lower extremity 03/27/2014   concern about hx and potential extensive on exam tenderness calf and low dose lovenox 40 qd 2-4 weekselevetion and close  fu advised .   [name] Allergy   . Anemia   . Anxiety   . Arthritis    "shoulders" (09/09/2012)  . Asthma 09/08/2012  . Carotid bruit    [place] 2018  low risk 1 - 39%  . Diabetes mellitus without complication ([place])    "Borderline" per pt; being monitored.  no meds per pt  . Dyspnea 04/27/2014  . GERD (gastroesophageal reflux disease)   . Headache(784.0)    "related to my high blood pressure" (09/09/2012)  . History of DVT (deep vein thrombosis)    "RLE" (09/09/2012) coumadine cant take asa so on plavix   . HTN (hypertension) 09/08/2012  . Hyperlipidemia   .  Lupus ([place])    "cured years ago" (09/09/2012)  . Other dysphagia 02/11/2013  . Syncope and collapse 01/21/2018  . Varicose veins   . Varicose veins of leg with complications 08/06/4980    Family History  Problem Relation Age of Onset  . Hypertension Mother   . Breast cancer Mother   . Hypertension Father   . Cancer Sister        Colon and Breast Cancer  . Breast cancer Sister   . Colon cancer Sister        ? 57' s dx - died in 41's  . Hypertension Brother   . Rectal cancer Brother   . Stomach cancer Brother   . Breast cancer Brother   . Heart attack Daughter   . Esophageal cancer Daughter   . Lung cancer Other        both parents   . Breast cancer Paternal Aunt  Past Surgical History:  Procedure Laterality Date  . ABDOMINAL HYSTERECTOMY     partial  . BREAST EXCISIONAL BIOPSY Right   . BREAST SURGERY    . CARPAL TUNNEL RELEASE Right 2000's  . CARPAL TUNNEL RELEASE Bilateral 10/21/2013   Procedure: BILATERAL  CARPAL TUNNEL RELEASE;  Surgeon: [name], MD;  Location: [place] [facility];  Service: Orthopedics;  Laterality: Bilateral;  . SHOULDER OPEN ROTATOR CUFF REPAIR Bilateral 2000's  . TRIGGER FINGER RELEASE Right 10/21/2013   Procedure: RELEASE TRIGGER FINGER/A-1 PULLEY RIGHT MIDDLE AND RIGHT RING;  Surgeon: [name], MD;  Location: [place];  Service: Orthopedics;  Laterality: Right;   Social History   Occupational History  . Not on file  Tobacco Use  . Smoking status: Never Smoker  . Smokeless tobacco: Never Used  Vaping Use  . Vaping Use: Never used  Substance and Sexual Activity  . Alcohol use: No  . Drug use: No  . Sexual activity: Not Currently    Birth control/protection: Surgical

## 2020-05-17 ENCOUNTER — Telehealth: Payer: Self-pay | Admitting: Internal Medicine

## 2020-05-17 NOTE — Telephone Encounter (Signed)
Left message for patient to call back and schedule Medicare Annual Wellness Visit (AWV) either virtually or in office.   Last AWV no information please schedule at anytime with [facility] Nurse Health Advisor 1 or 2   This should be a 45 minute visit. 

## 2020-05-21 ENCOUNTER — Telehealth: Payer: Self-pay | Admitting: Physical Medicine and Rehabilitation

## 2020-05-21 NOTE — Telephone Encounter (Signed)
Patient's daughter [name] returned call to make an appointment for patient. The number to contact [name] is [phone]

## 2020-05-24 ENCOUNTER — Telehealth: Payer: Self-pay

## 2020-05-24 NOTE — Telephone Encounter (Signed)
Needing to sch pt for inj. Pt would need to be off her BT for seven days before inj. Would that be Okay?

## 2020-05-24 NOTE — Telephone Encounter (Signed)
Yes okay to stop 7 days before procedure

## 2020-05-24 NOTE — Telephone Encounter (Signed)
Pending for Okay to stop BT for seven days request.

## 2020-05-25 ENCOUNTER — Telehealth: Payer: Self-pay | Admitting: Orthopedic Surgery

## 2020-05-25 NOTE — Telephone Encounter (Signed)
[name] with [facility] called stating the pt was cleared to be off her blood thinners 7 days prior to surgery.   [name] CB# [phone]

## 2020-05-25 NOTE — Telephone Encounter (Signed)
See below

## 2020-05-25 NOTE — Telephone Encounter (Signed)
Spoke with the patients grand-daughter and informed her of the message below.  She stated the pt needed this info for an injection she will receive by Dr Ernestina Patches.  I called Dr Romona Curls office at [phone] and informed Mia of the approval below.

## 2020-05-25 NOTE — Telephone Encounter (Signed)
Good to know, thanks!  I think this is more in reference to the ESIs I referred her for just FYI, no planned surgery on our end

## 2020-05-26 NOTE — Telephone Encounter (Signed)
Joann with [facility] called stating the pt was cleared to be off her blood thinners 7 days prior to surgery.    Joann CB# [phone]

## 2020-05-26 NOTE — Telephone Encounter (Signed)
Called pt and sch 2/2, also informing grandaugther the stop pt BT on 1/26.

## 2020-06-01 ENCOUNTER — Other Ambulatory Visit: Payer: Self-pay | Admitting: Internal Medicine

## 2020-06-01 NOTE — Telephone Encounter (Signed)
Patient has an appointment scheduled on 06/02/2020

## 2020-06-02 ENCOUNTER — Ambulatory Visit: Payer: Medicare Other | Admitting: Internal Medicine

## 2020-06-10 DIAGNOSIS — H4710 Unspecified papilledema: Secondary | ICD-10-CM | POA: Diagnosis not present

## 2020-06-10 DIAGNOSIS — H40023 Open angle with borderline findings, high risk, bilateral: Secondary | ICD-10-CM | POA: Diagnosis not present

## 2020-06-10 DIAGNOSIS — H2000 Unspecified acute and subacute iridocyclitis: Secondary | ICD-10-CM | POA: Diagnosis not present

## 2020-06-10 LAB — HM DIABETES EYE EXAM

## 2020-06-15 ENCOUNTER — Other Ambulatory Visit: Payer: Self-pay | Admitting: Emergency Medicine

## 2020-06-16 ENCOUNTER — Ambulatory Visit (INDEPENDENT_AMBULATORY_CARE_PROVIDER_SITE_OTHER): Payer: Medicare Other | Admitting: Physical Medicine and Rehabilitation

## 2020-06-16 ENCOUNTER — Encounter: Payer: Self-pay | Admitting: Physical Medicine and Rehabilitation

## 2020-06-16 ENCOUNTER — Ambulatory Visit: Payer: Self-pay

## 2020-06-16 ENCOUNTER — Other Ambulatory Visit: Payer: Self-pay

## 2020-06-16 VITALS — BP 163/65 | HR 65

## 2020-06-16 DIAGNOSIS — M5416 Radiculopathy, lumbar region: Secondary | ICD-10-CM | POA: Diagnosis not present

## 2020-06-16 MED ORDER — BETAMETHASONE SOD PHOS & ACET 6 (3-3) MG/ML IJ SUSP
12.0000 mg | Freq: Once | INTRAMUSCULAR | Status: AC
  Administered 2020-06-16: 12 mg

## 2020-06-16 NOTE — Patient Instructions (Signed)

## 2020-06-16 NOTE — Progress Notes (Signed)
Pt state lower back pain that travels down her right leg and foot. Pt state walking makes the pai worse. Pt state she take pain meds and use heating pads to help ease the pain. Pt has hx of inj on 10/22/19 Pt state it worked for two months.  Numeric Pain Rating Scale and Functional Assessment Average Pain 10   In the last MONTH (on 0-10 scale) has pain interfered with the following?  1. General activity like being  able to carry out your everyday physical activities such as walking, climbing stairs, carrying groceries, or moving a chair?  Rating(10)   +Driver, +BT Pt state she been off her BT for seven days., -Dye Allergies.

## 2020-06-21 ENCOUNTER — Telehealth: Payer: Self-pay | Admitting: Pharmacist

## 2020-06-21 NOTE — Procedures (Signed)
Lumbar Epidural Steroid Injection - Interlaminar Approach with Fluoroscopic Guidance  Patient: [name]      Date of Birth: 1934/05/14 MRN: [id number] PCP: Burnis Medin, MD      Visit Date: 06/16/2020   Universal Protocol:     Consent Given By: the patient  Position: PRONE  Additional Comments: Vital signs were monitored before and after the procedure. Patient was prepped and draped in the usual sterile fashion. The correct patient, procedure, and site was verified.   Injection Procedure Details:   Procedure diagnoses: Lumbar radiculopathy [M54.16]   Meds Administered:  Meds ordered this encounter  Medications  . betamethasone acetate-betamethasone sodium phosphate (CELESTONE) injection 12 mg     Laterality: Left  Location/Site:  L5-S1  Needle: 3.5 in., 20 ga. Tuohy  Needle Placement: Paramedian epidural  Findings:   -Comments: Excellent flow of contrast into the epidural space.  Initial flow of contrast on counter oblique seem to be more of a dural flow.  There was no aspiration of spinal fluid.  On AP view this showed spread in the lateral recess even from a left-sided injection to the right.  Repositioning the needle with good end feel of loss of resistance and good flow of contrast epidural but still with spread of contrast in the right lateral recess  Procedure Details: Using a paramedian approach from the side mentioned above, the region overlying the inferior lamina was localized under fluoroscopic visualization and the soft tissues overlying this structure were infiltrated with 4 ml. of 1% Lidocaine without Epinephrine. The Tuohy needle was inserted into the epidural space using a paramedian approach.   The epidural space was localized using loss of resistance along with counter oblique bi-planar fluoroscopic views.  After negative aspirate for air, blood, and CSF, a 2 ml. volume of Isovue-250 was injected into the epidural space and the flow of contrast  was observed. Radiographs were obtained for documentation purposes.    The injectate was administered into the level noted above.   Additional Comments:  The patient tolerated the procedure well Dressing: 2 x 2 sterile gauze and Band-Aid    Post-procedure details: Patient was observed during the procedure. Post-procedure instructions were reviewed.  Patient left the clinic in stable condition.

## 2020-06-21 NOTE — Progress Notes (Signed)
[name] - 87 y.o. female MRN [id number]  Date of birth: Dec 12, 1933  Office Visit Note: Visit Date: 06/16/2020 PCP: Burnis Medin, MD Referred by: Burnis Medin, MD  Subjective: Chief Complaint  Patient presents with  . Lower Back - Pain  . Right Foot - Pain  . Right Leg - Pain   HPI:  [name] is a 87 y.o. female who comes in today at the request of Dr. Anderson Malta for planned Left L5-S1 Lumbar epidural steroid injection with fluoroscopic guidance.  The patient has failed conservative care including home exercise, medications, time and activity modification.  This injection will be diagnostic and hopefully therapeutic.  Please see requesting physician notes for further details and justification.   ROS Otherwise per HPI.  Assessment & Plan: Visit Diagnoses:    ICD-10-CM   1. Lumbar radiculopathy  M54.16 XR C-ARM NO REPORT    Epidural Steroid injection    betamethasone acetate-betamethasone sodium phosphate (CELESTONE) injection 12 mg    Plan: No additional findings.   Meds & Orders:  Meds ordered this encounter  Medications  . betamethasone acetate-betamethasone sodium phosphate (CELESTONE) injection 12 mg    Orders Placed This Encounter  Procedures  . XR C-ARM NO REPORT  . Epidural Steroid injection    Follow-up: Return if symptoms worsen or fail to improve.   Procedures: No procedures performed  Lumbar Epidural Steroid Injection - Interlaminar Approach with Fluoroscopic Guidance  Patient: [name]      Date of Birth: 06-Nov-1933 MRN: [id number] PCP: Burnis Medin, MD      Visit Date: 06/16/2020   Universal Protocol:     Consent Given By: the patient  Position: PRONE  Additional Comments: Vital signs were monitored before and after the procedure. Patient was prepped and draped in the usual sterile fashion. The correct patient, procedure, and site was verified.   Injection Procedure Details:   Procedure diagnoses:  Lumbar radiculopathy [M54.16]   Meds Administered:  Meds ordered this encounter  Medications  . betamethasone acetate-betamethasone sodium phosphate (CELESTONE) injection 12 mg     Laterality: Left  Location/Site:  L5-S1  Needle: 3.5 in., 20 ga. Tuohy  Needle Placement: Paramedian epidural  Findings:   -Comments: Excellent flow of contrast into the epidural space.  Initial flow of contrast on counter oblique seem to be more of a dural flow.  There was no aspiration of spinal fluid.  On AP view this showed spread in the lateral recess even from a left-sided injection to the right.  Repositioning the needle with good end feel of loss of resistance and good flow of contrast epidural but still with spread of contrast in the right lateral recess  Procedure Details: Using a paramedian approach from the side mentioned above, the region overlying the inferior lamina was localized under fluoroscopic visualization and the soft tissues overlying this structure were infiltrated with 4 ml. of 1% Lidocaine without Epinephrine. The Tuohy needle was inserted into the epidural space using a paramedian approach.   The epidural space was localized using loss of resistance along with counter oblique bi-planar fluoroscopic views.  After negative aspirate for air, blood, and CSF, a 2 ml. volume of Isovue-250 was injected into the epidural space and the flow of contrast was observed. Radiographs were obtained for documentation purposes.    The injectate was administered into the level noted above.   Additional Comments:  The patient tolerated the procedure well Dressing: 2 x 2 sterile  gauze and Band-Aid    Post-procedure details: Patient was observed during the procedure. Post-procedure instructions were reviewed.  Patient left the clinic in stable condition.    Clinical History: MRI LUMBAR SPINE WITHOUT CONTRAST  TECHNIQUE: Multiplanar, multisequence MR imaging of the lumbar spine  was performed. No intravenous contrast was administered.  COMPARISON:  07/14/2011  FINDINGS: Segmentation:  5 lumbar type vertebral bodies  Alignment: No scoliosis. Degenerative anterolisthesis at L4-5 of 6 mm.  Vertebrae: No fracture or primary bone lesion. Chronic discogenic marrow changes at L5-S1.  Conus medullaris: Extends to the L1 level and appears normal.  Paraspinal and other soft tissues: Simple cysts of the left kidney measuring 3 cm. Enlarged from 2 cm on the study of 2013.  Disc levels:  Mild noncompressive disc bulges at L3-4 and above. No disc herniation, stenosis or neural compression.  L4-5: Advanced bilateral facet arthropathy with anterolisthesis of 6 mm. Bulging of the disc. Mild narrowing of the lateral recesses and intervertebral foramina but without visible neural compression.  L5-S1: Chronic disc degeneration with loss of disc height, endplate osteophytes and bulging of the disc. Discogenic changes of the L5 and S1 endplates. Facet degeneration bilaterally. Mild narrowing of the lateral recesses and foramina but without visible neural compression.  Since the previous study, the degenerative changes at L5-S1 have worsened somewhat.  IMPRESSION: L4-5: Chronic bilateral facet arthropathy allowing 6 mm of anterolisthesis. Bulging of the disc. Mild narrowing of the lateral recesses without visible neural compression. The findings could relate to back pain.  L5-S1: Chronic disc degeneration with loss of disc height, endplate osteophytes and bulging of the disc. Bilateral facet degeneration. Mild narrowing of the lateral recesses and foramina. The findings could contribute to back pain. There would be some potential for L5 nerve root irritation in the foramina. Degenerative changes at this level have worsened since 2013.   Electronically Signed   By: Nelson Chimes M.D.   On: 12/13/2015 14:20     Objective:  VS:  HT:    WT:    BMI:     BP:(!) 163/65  HR:65bpm  TEMP: ( )  RESP:  Physical Exam Vitals and nursing note reviewed.  Constitutional:      General: She is not in acute distress.    Appearance: Normal appearance. She is not ill-appearing.  HENT:     Head: Normocephalic and atraumatic.     Right Ear: External ear normal.     Left Ear: External ear normal.  Eyes:     Extraocular Movements: Extraocular movements intact.  Cardiovascular:     Rate and Rhythm: Normal rate.     Pulses: Normal pulses.  Pulmonary:     Effort: Pulmonary effort is normal. No respiratory distress.  Abdominal:     General: There is no distension.     Palpations: Abdomen is soft.  Musculoskeletal:        General: Tenderness present.     Cervical back: Neck supple.     Right lower leg: No edema.     Left lower leg: No edema.     Comments: Patient has good distal strength with no pain over the greater trochanters.  No clonus or focal weakness.  Skin:    Findings: No erythema, lesion or rash.  Neurological:     General: No focal deficit present.     Mental Status: She is alert and oriented to person, place, and time.     Sensory: No sensory deficit.     Motor:  No weakness or abnormal muscle tone.     Coordination: Coordination normal.  Psychiatric:        Mood and Affect: Mood normal.        Behavior: Behavior normal.      Imaging: No results found.

## 2020-06-21 NOTE — Chronic Care Management (AMB) (Signed)
Chronic Care Management Pharmacy Assistant   Name: [name]  MRN: [id number] DOB: 11-22-1933  Reason for Encounter: General Adherence call  PCP : Burnis Medin, MD  Allergies:   Allergies  Allergen Reactions  . Penicillins Nausea And Vomiting and Other (See Comments)    Has patient had a PCN reaction causing immediate rash, facial/tongue/throat swelling, SOB or lightheadedness with hypotension: Y Has patient had a PCN reaction causing severe rash involving mucus membranes or skin necrosis: Y Has patient had a PCN reaction that required hospitalization: N Has patient had a PCN reaction occurring within the last 10 years: N If all of the above answers are "NO", then may proceed with Cephalosporin use.   . Aspirin Other (See Comments)    Wheezing Acetaminophen is OK    Medications: Outpatient Encounter Medications as of 06/21/2020  Medication Sig  . albuterol (PROVENTIL) (2.5 MG/3ML) 0.083% nebulizer solution USE 1 AMPULE EVERY 6 HOURS AS NEEDED FOR WHEEZE  . albuterol (VENTOLIN HFA) 108 (90 Base) MCG/ACT inhaler USE 2 PUFFS EVERY 6 HOURS AS NEEDED FOR WHEEZING.  [name] amLODipine (NORVASC) 5 MG tablet Take 1 tablet (5 mg total) by mouth daily.  [name] atorvastatin (LIPITOR) 20 MG tablet TAKE 1 TABLET ONCE DAILY.  [name] Azelastine-Fluticasone (DYMISTA) 137-50 MCG/ACT SUSP Place 1 spray into the nose in the morning and at bedtime.  [name] BREZTRI AEROSPHERE 160-9-4.8 MCG/ACT AERO INHALE 2 PUFFS INTO THE LUNGS TWICE DAILY, IN THE MORNING & AT BEDTIME.  . carvedilol (COREG) 6.25 MG tablet Take 6.25 mg by mouth daily.  . citalopram (CELEXA) 10 MG tablet Take 1 tablet (10 mg total) by mouth daily.  . clopidogrel (PLAVIX) 75 MG tablet TAKE 1 TABLET ONCE DAILY.  [name] diclofenac sodium (VOLTAREN) 1 % GEL APPLY 4GM TO AFFECTED AREA(S) 4 TIMES A DAY.  [name] doxazosin (CARDURA) 2 MG tablet TAKE 3 TABLETS AT BEDTIME.  . ferrous sulfate 325 (65 FE) MG EC tablet Take 1 tablet (325 mg total) by mouth daily  with breakfast.  . fluorometholone (FML) 0.1 % ophthalmic suspension Place 1 drop into both eyes 2 (two) times daily.   . fluticasone (FLONASE) 50 MCG/ACT nasal spray Place 2 sprays into both nostrils daily.  . Fluticasone-Salmeterol (ADVAIR) 250-50 MCG/DOSE AEPB USE 1 PUFF TWICE DAILY.  . furosemide (LASIX) 40 MG tablet Take 80 mg by mouth 3 (three) times daily.   . hydrALAZINE (APRESOLINE) 50 MG tablet Take 50 mg by mouth 3 (three) times daily.  [name] HYDROcodone-acetaminophen (NORCO/VICODIN) 5-325 MG tablet Take 1 tablet by mouth daily as needed for moderate pain.  [name] lactobacillus acidophilus & bulgar (LACTINEX) chewable tablet Chew 1 tablet by mouth 3 (three) times daily with meals.  . lidocaine (LIDODERM) 5 % Place 1 patch onto the skin daily. Remove & Discard patch within 12 hours or as directed by MD  . loratadine (CLARITIN) 10 MG tablet Take 1 tablet (10 mg total) by mouth daily. (Patient taking differently: Take 10 mg by mouth daily as needed.)  . LORazepam (ATIVAN) 1 MG tablet TAKE 1 TABLET EVERY 8 HOURS AS NEEDED FOR ANXIETY. AVOID REGULAR USE.  . montelukast (SINGULAIR) 10 MG tablet TAKE 1 TABLET ONCE DAILY.  [name] ondansetron (ZOFRAN ODT) 4 MG disintegrating tablet Take 1 tablet (4 mg total) by mouth every 8 (eight) hours as needed for nausea or vomiting.  . pantoprazole (PROTONIX) 40 MG tablet TAKE (1) TABLET TWICE A DAY BEFORE MEALS.  [name] potassium chloride (KLOR-CON) 10 MEQ tablet  TAKE 4 TABLETS DAILY.  [name] RESTASIS 0.05 % ophthalmic emulsion Place 1 drop into both eyes 2 (two) times daily.   [name] Spacer/Aero-Holding Chambers (AEROCHAMBER PLUS) inhaler Use as instructed  . vitamin B-12 (CYANOCOBALAMIN) 1000 MCG tablet Take 1,000 mcg by mouth daily.  . Vitamin D, Cholecalciferol, 25 MCG (1000 UT) TABS Take 1,000 Units by mouth daily.   No facility-administered encounter medications on file as of 06/21/2020.    Current Diagnosis: Patient Active Problem List   Diagnosis Date Noted  . Chronic  rhinitis 11/24/2019  . Nausea 11/03/2018  . Elevated troponin 11/03/2018  . Hyponatremia 11/03/2018  . Hypertensive heart disease with hypertensive chronic kidney disease 11/30/2017  . Fasting hyperglycemia 11/30/2017  . Renal cyst 03/27/2016  . CKD (chronic kidney disease) stage 3, GFR 30-59 ml/min (HCC) 08/23/2015  . Perceived hearing changes 06/29/2014  . Resistant hypertension 06/29/2014  . Lumbago 06/15/2014  . Pre-diabetes vs early DM  06/15/2014  . Dyspnea 04/27/2014  . Asthma, chronic 04/13/2014  . Elevated uric acid in blood 04/13/2014  . Essential hypertension 03/27/2014  . History of DVT (deep vein thrombosis)   . Palpitations 01/05/2014  . Anemia of chronic disease 2013-12-25  . Death of family member 08-29-2013  . Leg edema 06/20/2013  . Bereavement due to life event 04/21/2013  . Other dysphagia 02/11/2013  . Colon cancer screening 02/11/2013  . Anxiety state 01/20/2013  . Leg cramps 01/20/2013  . Back pain 12/05/2012  . Family hx of colon cancer 10/21/2012  . Family hx of lung cancer 10/21/2012  . Family hx-breast malignancy 10/21/2012  . Renal insufficiency creatinine 1.3 10/21/2012  . Anemia, chronic disease 10/21/2012  . GERD (gastroesophageal reflux disease) 09/08/2012  . HTN (hypertension) 09/08/2012  . Hyperlipidemia 09/08/2012  . Varicose veins of leg with complications [id number]    Goals Addressed   None     Follow-Up:  Pharmacist Review and Scheduled Follow-Up With Clinical Pharmacist   I spoke with the patient and discussed medication adherence with the patient, no issues at this time with current medication. She states that she has been feeling better for the past month. She also stated that she got her booster COVID shot in December 2021. She stated that she tested positive back in September, and she had some breathing issues that are much better now. She has a follow-up appointment with her PCP next month. A new CPP appointment was made for  April 2022.  She denies ED visits since his last CPP follow-up. The patient denies any side effects with his medication. Also, denies any problems with his current pharmacy.  Maia Breslow, [place] Assistant [phone]

## 2020-06-29 IMAGING — CT CT ABDOMEN AND PELVIS WITH CONTRAST
2 of 5 series · 16 of 46 positions shown, 18 images · IV contrast (APPLIED)
Comparison: CT 05/03/2017

CLINICAL DATA: Epigastric pain and vomiting.

EXAM:
CT ABDOMEN AND PELVIS WITH CONTRAST
TECHNIQUE: Multidetector CT imaging of the abdomen and pelvis was performed
using the standard protocol following bolus administration of
intravenous contrast.
CONTRAST:  75mL OMNIPAQUE IOHEXOL 300 MG/ML  SOLN

[Series 3: abdomen 5.0 · axial · 0.82mm/px · z∈[+843,+1203]mm · 13 of 84 slices shown, 15 images]
[im 6/84  soft-tissue]
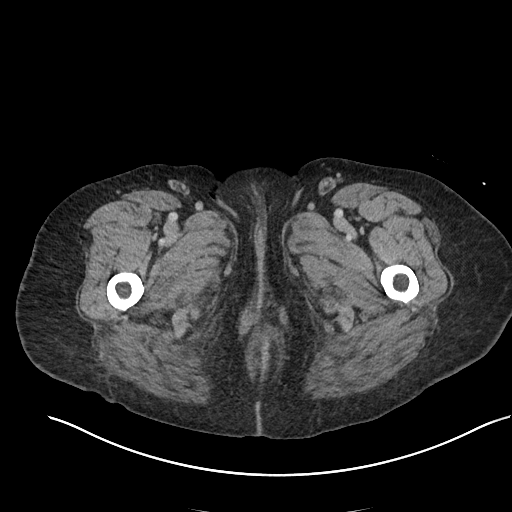
[im 6/84  bone]
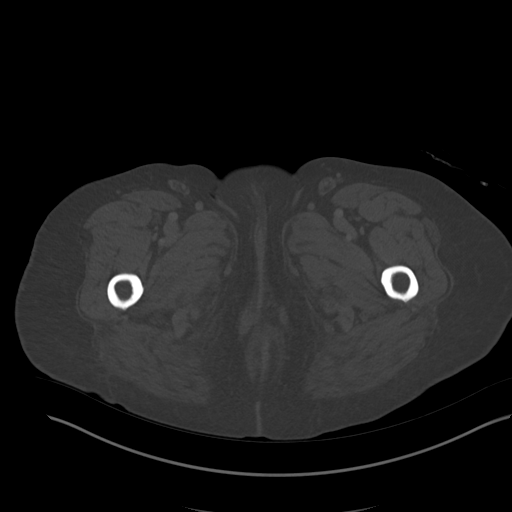
[im 12/84  soft-tissue]
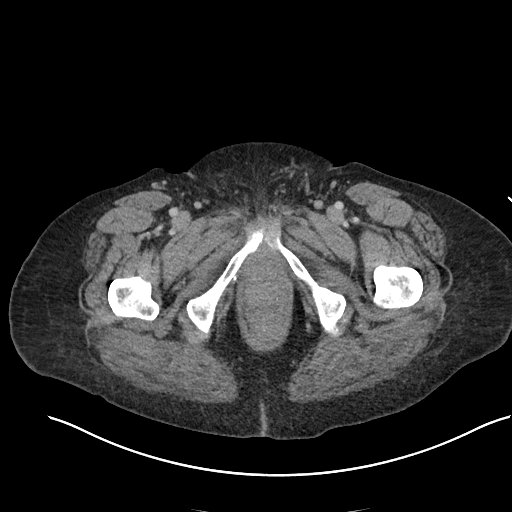
[im 18/84  soft-tissue]
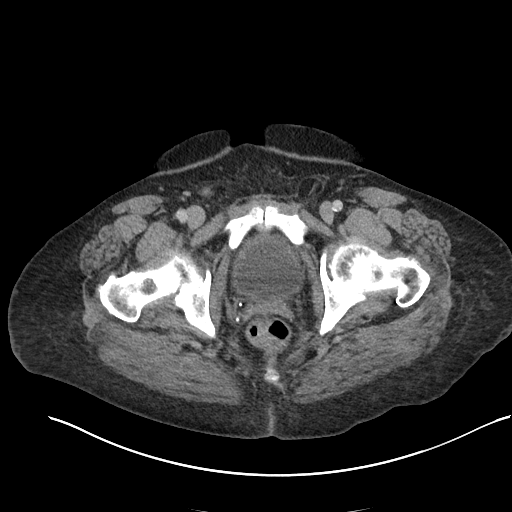
[im 24/84  soft-tissue]
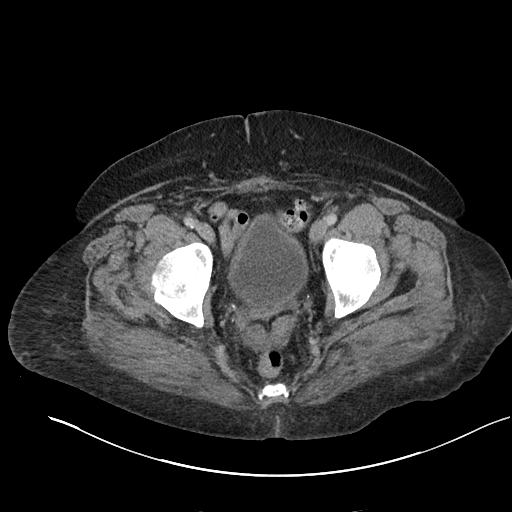
[im 30/84  soft-tissue]
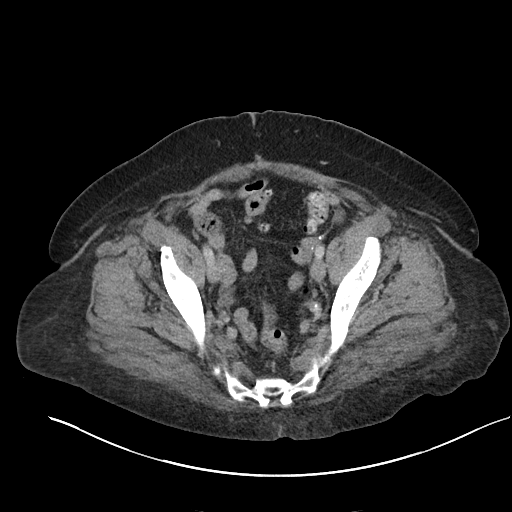
[im 36/84  soft-tissue]
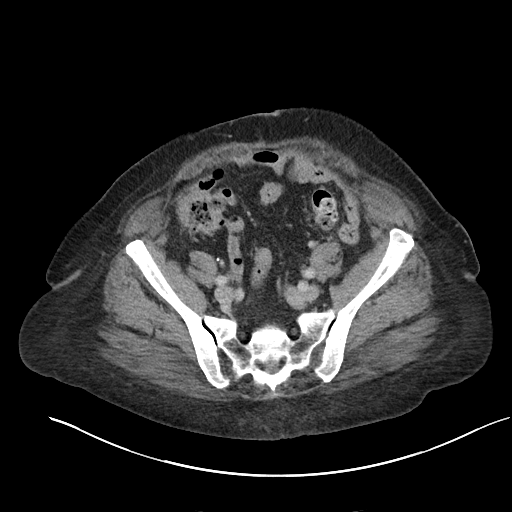
[im 42/84  soft-tissue]
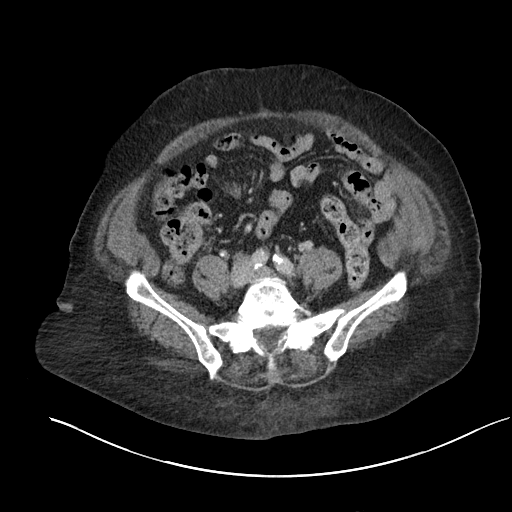
[im 48/84  soft-tissue]
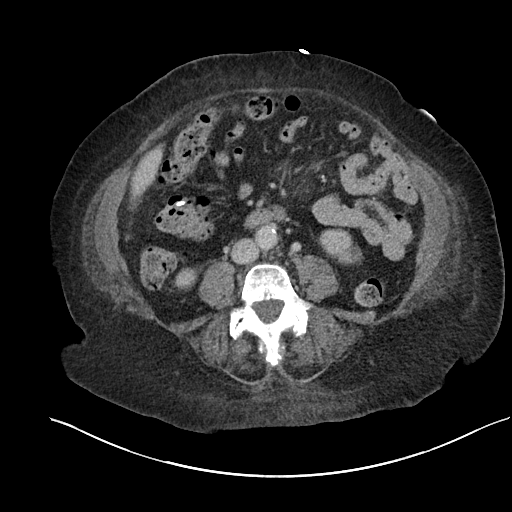
[im 54/84  soft-tissue]
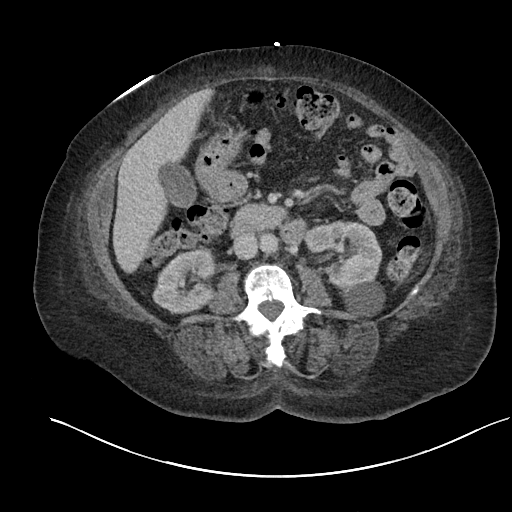
[im 54/84  bone]
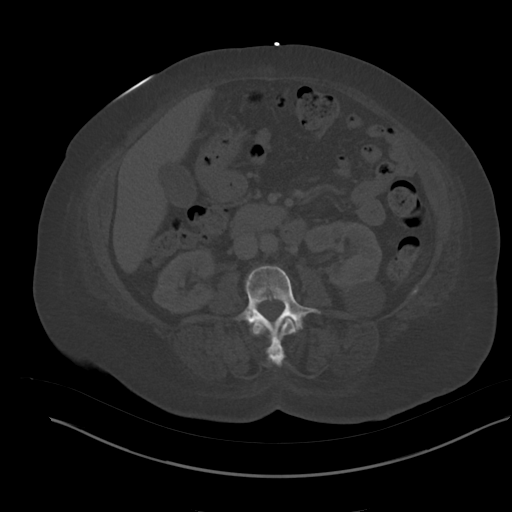
[im 60/84  soft-tissue]
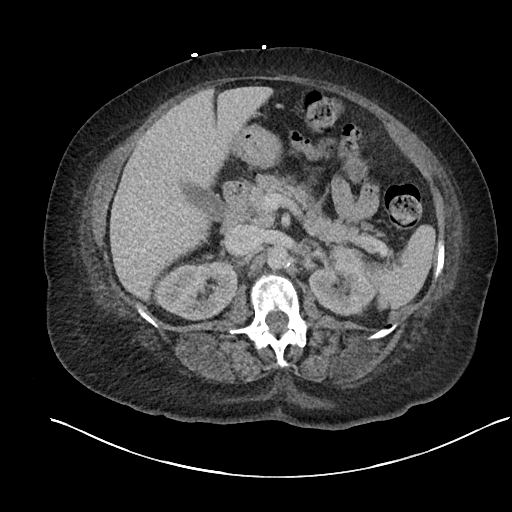
[im 66/84  soft-tissue]
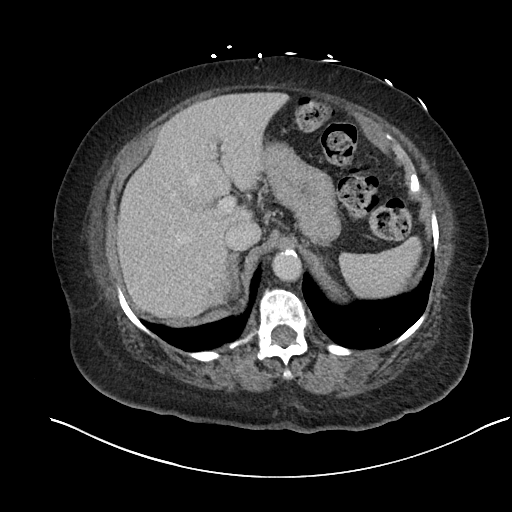
[im 72/84  soft-tissue]
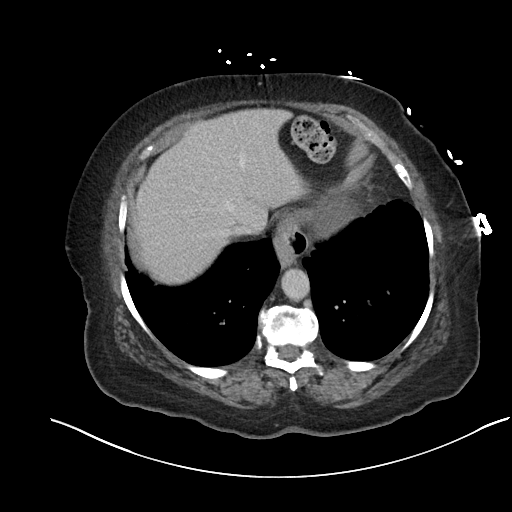
[im 78/84  soft-tissue]
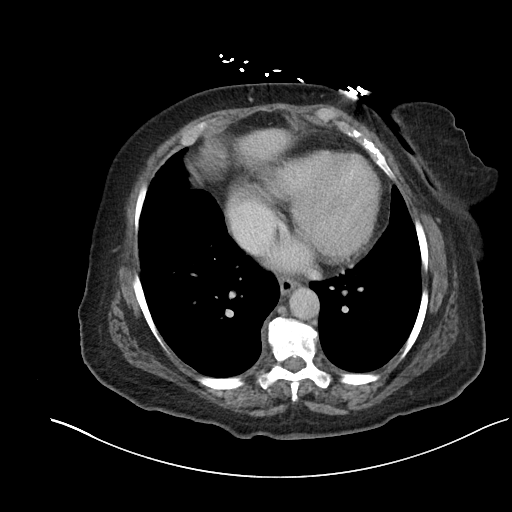

[Series 6: abdomen 3.0 mpr cor · coronal · 0.75mm/px · 3 of 105 slices shown]
[im 35/105  soft-tissue]
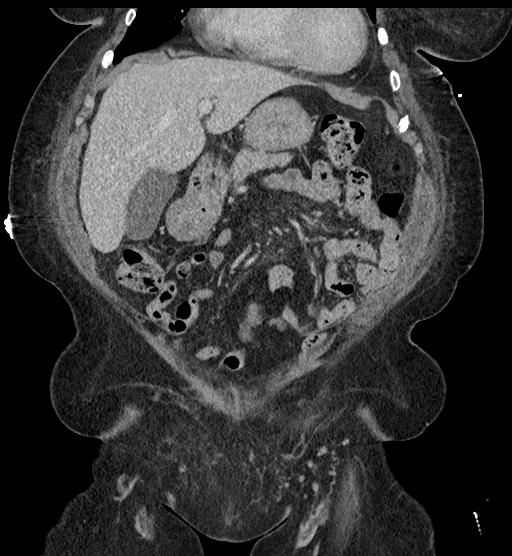
[im 47/105  soft-tissue]
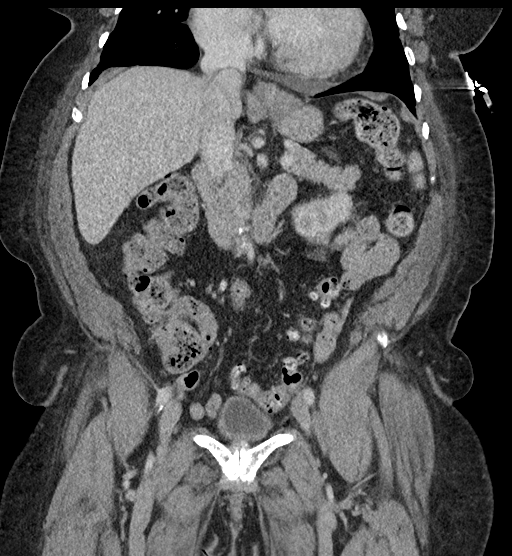
[im 58/105  soft-tissue]
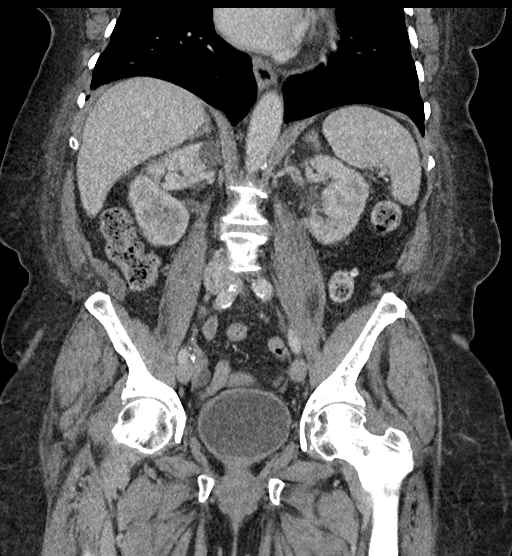

[16 of 46 positions shown; findings below may reference images not displayed]

FINDINGS: Lower chest: Mild nonspecific bilateral subpleural reticulation.
Multi chamber cardiomegaly.

Hepatobiliary: No focal liver abnormality is seen. No gallstones,
gallbladder wall thickening, or biliary dilatation.

Pancreas: No ductal dilatation or inflammation.

Spleen: Normal in size without focal abnormality.

Adrenals/Urinary Tract: No adrenal nodule. No hydronephrosis. Mild
left perinephric edema. Simple cyst in the upper left kidney
measures 3.7 cm. Additional small low-density lesions in both
kidneys are too small to accurately characterize. Urinary bladder is
physiologically distended. No definite bladder wall thickening.

Stomach/Bowel: Small hiatal hernia. Stomach is nondistended. No
bowel obstruction or wall thickening. No bowel inflammation.
Appendix not confidently visualized, no evidence of appendicitis.
Colonic diverticulosis without diverticulitis. Moderate diffuse
colonic stool burden. No colonic wall thickening.

Vascular/Lymphatic: Aorto bi-iliac atherosclerosis. No aneurysm.
Small left central mesenteric lymph nodes with hazy mesentery
chronic and unchanged from prior. No suspicious adenopathy in the
abdomen or pelvis.

Reproductive: Post hysterectomy. Ovaries are quiescent.

Other: No free air, free fluid, or intra-abdominal fluid collection.

Musculoskeletal: Multilevel degenerative change throughout the
lumbar spine. Pubic symphyseal degenerative change. There are no
acute or suspicious osseous abnormalities.
IMPRESSION: 1. Mild left perinephric edema, can be seen with urinary tract
infection.
2. Colonic diverticulosis without diverticulitis. Small hiatal
hernia.
3.  Aortic Atherosclerosis ([id number]).

## 2020-07-01 ENCOUNTER — Other Ambulatory Visit: Payer: Self-pay | Admitting: Internal Medicine

## 2020-07-01 DIAGNOSIS — N189 Chronic kidney disease, unspecified: Secondary | ICD-10-CM | POA: Diagnosis not present

## 2020-07-01 DIAGNOSIS — I129 Hypertensive chronic kidney disease with stage 1 through stage 4 chronic kidney disease, or unspecified chronic kidney disease: Secondary | ICD-10-CM | POA: Diagnosis not present

## 2020-07-01 DIAGNOSIS — D631 Anemia in chronic kidney disease: Secondary | ICD-10-CM | POA: Diagnosis not present

## 2020-07-01 DIAGNOSIS — N2581 Secondary hyperparathyroidism of renal origin: Secondary | ICD-10-CM | POA: Diagnosis not present

## 2020-07-01 DIAGNOSIS — N390 Urinary tract infection, site not specified: Secondary | ICD-10-CM | POA: Diagnosis not present

## 2020-07-01 DIAGNOSIS — N184 Chronic kidney disease, stage 4 (severe): Secondary | ICD-10-CM | POA: Diagnosis not present

## 2020-07-01 DIAGNOSIS — I8393 Asymptomatic varicose veins of bilateral lower extremities: Secondary | ICD-10-CM | POA: Diagnosis not present

## 2020-07-01 LAB — CBC: RBC: 3.58 — AB (ref 3.87–5.11)

## 2020-07-01 LAB — CBC AND DIFFERENTIAL
HCT: 31 — AB (ref 36–46)
Hemoglobin: 10 — AB (ref 12.0–16.0)
Platelets: 178 (ref 150–399)
WBC: 4.7

## 2020-07-01 LAB — BASIC METABOLIC PANEL
CO2: 27 — AB (ref 13–22)
Chloride: 105 (ref 99–108)
Creatinine: 2.3 — AB (ref <=1.1)
Glucose: 126
Potassium: 4.1 (ref 3.4–5.3)
Sodium: 143 (ref 137–147)

## 2020-07-01 LAB — IRON,TIBC AND FERRITIN PANEL
Ferritin: 130
Iron: 70
TIBC: 211
UIBC: 141

## 2020-07-01 LAB — COMPREHENSIVE METABOLIC PANEL
GFR calc Af Amer: 22
GFR calc non Af Amer: 19

## 2020-07-20 NOTE — Progress Notes (Signed)
Chief Complaint  Patient presents with  . Chronic Kidney Disease  . Gastroesophageal Reflux    Started 2 weeks ago. Having some stomach upset. Taking the Protonix bid as directed.     HPI: [name] 87 y.o. come in for Chronic disease management    See last visit she has been seen   For radicular  Issues   esi    And dr Lorayne Marek ckd   On bid  protonox  Seems to help reflux.  May have been the iron?  Leg edema stable   On compression stockings  No change in meds   Wants to make sure BG is  In range .    No unusual bleeding   Iron som gi se   resp  Stable at this time   New providers   Inhalers  nebulizetrs help.  bp has been pretty controlled  By rpeort  Dec amlodipine  To avoid swelling   Form for  Handicapped  Tag renewal.   Doesn't  drive that much  Short and knowledgeable distances . ROS: See pertinent positives and negatives per HPI.  Past Medical History:  Diagnosis Date  . Acute superficial venous thrombosis of left lower extremity 03/27/2014   concern about hx and potential extensive on exam tenderness calf and low dose lovenox 40 qd 2-4 weekselevetion and close  fu advised .   [name] Allergy   . Anemia   . Anxiety   . Arthritis    "shoulders" (09/09/2012)  . Asthma 09/08/2012  . Carotid bruit    [place] 2018  low risk 1 - 39%  . Diabetes mellitus without complication ([place])    "Borderline" per pt; being monitored.  no meds per pt  . Dyspnea 04/27/2014  . GERD (gastroesophageal reflux disease)   . Headache(784.0)    "related to my high blood pressure" (09/09/2012)  . History of DVT (deep vein thrombosis)    "RLE" (09/09/2012) coumadine cant take asa so on plavix   . HTN (hypertension) 09/08/2012  . Hyperlipidemia   . Lupus ([place])    "cured years ago" (09/09/2012)  . Other dysphagia 02/11/2013  . Syncope and collapse 01/21/2018  . Varicose veins   . Varicose veins of leg with complications [id number]    Family History  Problem Relation Age of Onset  .  Hypertension Mother   . Breast cancer Mother   . Hypertension Father   . Cancer Sister        Colon and Breast Cancer  . Breast cancer Sister   . Colon cancer Sister        ? 5' s dx - died in 16's  . Hypertension Brother   . Rectal cancer Brother   . Stomach cancer Brother   . Breast cancer Brother   . Heart attack Daughter   . Esophageal cancer Daughter   . Lung cancer Other        both parents   . Breast cancer Paternal Aunt     Social History   Socioeconomic History  . Marital status: Single    Spouse name: Not on file  . Number of children: 6  . Years of education: Not on file  . Highest education level: Not on file  Occupational History  . Not on file  Tobacco Use  . Smoking status: Never Smoker  . Smokeless tobacco: Never Used  Vaping Use  . Vaping Use: Never used  Substance and Sexual Activity  . Alcohol use: No  .  Drug use: No  . Sexual activity: Not Currently    Birth control/protection: Surgical  Other Topics Concern  . Not on file  Social History Narrative   2 people living in the home.  Grand daughter.   Up and down through the night      Had 7 children  2 deceased . 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    works 3 days per week currently .   Glasses dentures  Neg tad    Social Determinants of Health   Financial Resource Strain: Low Risk   . Difficulty of Paying Living Expenses: Not hard at all  Food Insecurity: Not on file  Transportation Needs: No Transportation Needs  . Lack of Transportation (Medical): No  . Lack of Transportation (Non-Medical): No  Physical Activity: Not on file  Stress: Not on file  Social Connections: Not on file    Outpatient Medications Prior to Visit  Medication Sig Dispense Refill  . albuterol (PROVENTIL) (2.5 MG/3ML) 0.083% nebulizer solution USE 1 AMPULE EVERY 6 HOURS AS NEEDED FOR WHEEZE 75 mL 6  . albuterol (VENTOLIN HFA) 108  (90 Base) MCG/ACT inhaler USE 2 PUFFS EVERY 6 HOURS AS NEEDED FOR WHEEZING. 8.5 g 3  . amLODipine (NORVASC) 5 MG tablet Take 1 tablet (5 mg total) by mouth daily. 90 tablet 3  . atorvastatin (LIPITOR) 20 MG tablet TAKE 1 TABLET ONCE DAILY. 90 tablet 1  . Azelastine-Fluticasone (DYMISTA) 137-50 MCG/ACT SUSP Place 1 spray into the nose in the morning and at bedtime. 23 g 6  . BREZTRI AEROSPHERE 160-9-4.8 MCG/ACT AERO INHALE 2 PUFFS INTO THE LUNGS TWICE DAILY, IN THE MORNING & AT BEDTIME. 10.7 g 1  . carvedilol (COREG) 6.25 MG tablet Take 6.25 mg by mouth daily.    . citalopram (CELEXA) 10 MG tablet Take 1 tablet (10 mg total) by mouth daily. 90 tablet 1  . diclofenac sodium (VOLTAREN) 1 % GEL APPLY 4GM TO AFFECTED AREA(S) 4 TIMES A DAY. 100 g 0  . ferrous sulfate 325 (65 FE) MG EC tablet Take 1 tablet (325 mg total) by mouth daily with breakfast. 30 tablet 3  . fluorometholone (FML) 0.1 % ophthalmic suspension Place 1 drop into both eyes 2 (two) times daily.     . fluticasone (FLONASE) 50 MCG/ACT nasal spray Place 2 sprays into both nostrils daily. 16 g 2  . Fluticasone-Salmeterol (ADVAIR) 250-50 MCG/DOSE AEPB USE 1 PUFF TWICE DAILY. 60 each 6  . furosemide (LASIX) 40 MG tablet Take 80 mg by mouth 3 (three) times daily.     . hydrALAZINE (APRESOLINE) 50 MG tablet Take 50 mg by mouth 3 (three) times daily.    [name] HYDROcodone-acetaminophen (NORCO/VICODIN) 5-325 MG tablet Take 1 tablet by mouth daily as needed for moderate pain. 15 tablet 0  . lactobacillus acidophilus & bulgar (LACTINEX) chewable tablet Chew 1 tablet by mouth 3 (three) times daily with meals. 30 tablet 0  . lidocaine (LIDODERM) 5 % Place 1 patch onto the skin daily. Remove & Discard patch within 12 hours or as directed by MD 30 patch 0  . loratadine (CLARITIN) 10 MG tablet Take 1 tablet (10 mg total) by mouth daily. (Patient taking differently: Take 10 mg by mouth daily as needed.) 30 tablet 11  . LORazepam (ATIVAN) 1 MG tablet TAKE 1  TABLET EVERY 8 HOURS AS NEEDED FOR ANXIETY. AVOID REGULAR USE.  24 tablet 0  . montelukast (SINGULAIR) 10 MG tablet TAKE 1 TABLET ONCE DAILY. 90 tablet 0  . ondansetron (ZOFRAN ODT) 4 MG disintegrating tablet Take 1 tablet (4 mg total) by mouth every 8 (eight) hours as needed for nausea or vomiting. 20 tablet 0  . pantoprazole (PROTONIX) 40 MG tablet TAKE (1) TABLET TWICE A DAY BEFORE MEALS. 180 tablet 3  . potassium chloride (KLOR-CON) 10 MEQ tablet TAKE 4 TABLETS DAILY. 120 tablet 1  . RESTASIS 0.05 % ophthalmic emulsion Place 1 drop into both eyes 2 (two) times daily.     [name] Spacer/Aero-Holding Chambers (AEROCHAMBER PLUS) inhaler Use as instructed 1 each 2  . vitamin B-12 (CYANOCOBALAMIN) 1000 MCG tablet Take 1,000 mcg by mouth daily.    . Vitamin D, Cholecalciferol, 25 MCG (1000 UT) TABS Take 1,000 Units by mouth daily.    . clopidogrel (PLAVIX) 75 MG tablet TAKE 1 TABLET ONCE DAILY. 30 tablet 3  . doxazosin (CARDURA) 2 MG tablet TAKE 3 TABLETS AT BEDTIME. 90 tablet 3   No facility-administered medications prior to visit.     EXAM:  BP (!) 142/62   Pulse 69   Temp 98.4 F (36.9 C) (Oral)   Resp 14   Ht '5\' 1"'$  (1.549 m)   Wt 171 lb (77.6 kg)   SpO2 98%   BMI 32.31 kg/m   Body mass index is 32.31 kg/m.  GENERAL: vitals reviewed and listed above, alert, oriented, appears well hydrated and in no acute distress HEENT: atraumatic, conjunctiva  clear, no obvious abnormalities on inspection of external nose and ears OP : masked NECK: no obvious masses on inspection palpation  LUNGS: clear to auscultation bilaterally, no wheezes, rales or rhonchi, good air movement CV: HRRR, no clubbing cyanosis or    Legs in stockings  2+ edemal  MS: moves all extremities without noticeable focal  Abnormality ambulatory without assistance  PSYCH: pleasant and cooperative, no obvious depression or anxiety Lab Results  Component Value Date   WBC 4.7 07/01/2020   HGB 10.0 (A) 07/01/2020   HCT 31 (A)  07/01/2020   PLT 178 07/01/2020   GLUCOSE 111 (H) 07/21/2020   CHOL 198 07/21/2020   TRIG 132.0 07/21/2020   HDL 66.40 07/21/2020   LDLCALC 106 (H) 07/21/2020   ALT 6 07/21/2020   AST 16 07/21/2020   NA 143 07/21/2020   K 3.8 07/21/2020   CL 103 07/21/2020   CREATININE 2.45 (H) 07/21/2020   BUN 42 (H) 07/21/2020   CO2 28 07/21/2020   TSH 2.34 09/19/2019   INR 0.94 12/22/2009   HGBA1C 6.0 07/21/2020   MICROALBUR 10.7 (H) 04/25/2016   BP Readings from Last 3 Encounters:  07/21/20 (!) 142/62  06/16/20 (!) 163/65  03/10/20 (!) 152/78   machine error  For poct a1c   Cancel order and  Do venipuncture   Results  ASSESSMENT AND PLAN:  Discussed the following assessment and plan:  Medication management - Plan: Hemoglobin A1c, IBC + Ferritin, Lipid panel, Comprehensive metabolic panel, Comprehensive metabolic panel, Lipid panel, Hemoglobin A1c  Fasting hyperglycemia - Plan: Hemoglobin A1c, IBC + Ferritin, Lipid panel, Comprehensive metabolic panel, Comprehensive metabolic panel, Lipid panel, Hemoglobin A1c, CANCELED: POCT HgB A1C  Anemia, chronic disease - Plan: Hemoglobin A1c, IBC + Ferritin, Lipid panel, Comprehensive metabolic panel, Comprehensive metabolic panel, Lipid panel, Hemoglobin A1c  Essential hypertension - Plan: Hemoglobin A1c, IBC + Ferritin, Lipid panel, Comprehensive metabolic panel, Comprehensive metabolic panel, Lipid panel, Hemoglobin A1c  Chronic  kidney disease (CKD), stage IV (severe) (HCC) - Plan: Hemoglobin A1c, IBC + Ferritin, Lipid panel, Comprehensive metabolic panel, Comprehensive metabolic panel, Lipid panel, Hemoglobin A1c  Gastroesophageal reflux disease, unspecified whether esophagitis present - ? iron se   doing better on bid ppi?  Will notify you  of labs when available.   Fu 4-6 mos   Handicap[pped form completed and signed .   -Patient advised to return or notify health care team  if  new concerns arise.  Patient Instructions  No change in  medications at this time .  Will notify you  of labs when available.   Plan  rov in 6 months  Or as needed       Mariann Laster K. Zykee Avakian M.D.

## 2020-07-21 ENCOUNTER — Other Ambulatory Visit: Payer: Self-pay

## 2020-07-21 ENCOUNTER — Other Ambulatory Visit: Payer: Self-pay | Admitting: Internal Medicine

## 2020-07-21 ENCOUNTER — Ambulatory Visit (INDEPENDENT_AMBULATORY_CARE_PROVIDER_SITE_OTHER): Payer: Medicare Other | Admitting: Internal Medicine

## 2020-07-21 ENCOUNTER — Encounter: Payer: Self-pay | Admitting: Internal Medicine

## 2020-07-21 VITALS — BP 142/62 | HR 69 | Temp 98.4°F | Resp 14 | Ht 61.0 in | Wt 171.0 lb

## 2020-07-21 DIAGNOSIS — N184 Chronic kidney disease, stage 4 (severe): Secondary | ICD-10-CM

## 2020-07-21 DIAGNOSIS — Z79899 Other long term (current) drug therapy: Secondary | ICD-10-CM | POA: Diagnosis not present

## 2020-07-21 DIAGNOSIS — I10 Essential (primary) hypertension: Secondary | ICD-10-CM | POA: Diagnosis not present

## 2020-07-21 DIAGNOSIS — D638 Anemia in other chronic diseases classified elsewhere: Secondary | ICD-10-CM

## 2020-07-21 DIAGNOSIS — K219 Gastro-esophageal reflux disease without esophagitis: Secondary | ICD-10-CM

## 2020-07-21 DIAGNOSIS — R7301 Impaired fasting glucose: Secondary | ICD-10-CM | POA: Diagnosis not present

## 2020-07-21 LAB — COMPREHENSIVE METABOLIC PANEL
ALT: 6 U/L (ref 0–35)
AST: 16 U/L (ref 0–37)
Albumin: 3.8 g/dL (ref 3.5–5.2)
Alkaline Phosphatase: 46 U/L (ref 39–117)
BUN: 42 mg/dL — ABNORMAL HIGH (ref 6–23)
CO2: 28 mEq/L (ref 19–32)
Calcium: 9.6 mg/dL (ref 8.4–10.5)
Chloride: 103 mEq/L (ref 96–112)
Creatinine, Ser: 2.45 mg/dL — ABNORMAL HIGH (ref 0.40–1.20)
GFR: 17.65 mL/min — ABNORMAL LOW (ref >60.00)
Glucose, Bld: 111 mg/dL — ABNORMAL HIGH (ref 70–99)
Potassium: 3.8 mEq/L (ref 3.5–5.1)
Sodium: 143 mEq/L (ref 135–145)
Total Bilirubin: 0.6 mg/dL (ref 0.2–1.2)
Total Protein: 6.6 g/dL (ref 6.0–8.3)

## 2020-07-21 LAB — HEMOGLOBIN A1C: Hgb A1c MFr Bld: 6 % (ref 4.6–6.5)

## 2020-07-21 LAB — LIPID PANEL
Cholesterol: 198 mg/dL (ref 0–200)
HDL: 66.4 mg/dL (ref >39.00)
LDL Cholesterol: 106 mg/dL — ABNORMAL HIGH (ref 0–99)
NonHDL: 132.09
Total CHOL/HDL Ratio: 3
Triglycerides: 132 mg/dL (ref 0.0–149.0)
VLDL: 26.4 mg/dL (ref 0.0–40.0)

## 2020-07-21 LAB — IBC + FERRITIN
Ferritin: 87.8 ng/mL (ref 10.0–291.0)
Iron: 105 ug/dL (ref 42–145)
Saturation Ratios: 43.9 % (ref 20.0–50.0)
Transferrin: 171 mg/dL — ABNORMAL LOW (ref 212.0–360.0)

## 2020-07-21 NOTE — Patient Instructions (Addendum)
No change in medications at this time .  Will notify you  of labs when available.   Plan  rov in 6 months  Or as needed

## 2020-07-21 NOTE — Progress Notes (Signed)
A1c is 6  slight up  but still acceptable and Not in  diabetes range .  Iron levels ok  this time  Creatinine 2.45 about the same range in past year

## 2020-07-22 ENCOUNTER — Encounter: Payer: Self-pay | Admitting: Internal Medicine

## 2020-07-22 DIAGNOSIS — H2000 Unspecified acute and subacute iridocyclitis: Secondary | ICD-10-CM | POA: Diagnosis not present

## 2020-07-22 DIAGNOSIS — H4710 Unspecified papilledema: Secondary | ICD-10-CM | POA: Diagnosis not present

## 2020-07-22 DIAGNOSIS — H04123 Dry eye syndrome of bilateral lacrimal glands: Secondary | ICD-10-CM | POA: Diagnosis not present

## 2020-08-11 ENCOUNTER — Other Ambulatory Visit: Payer: Self-pay | Admitting: Internal Medicine

## 2020-08-11 ENCOUNTER — Other Ambulatory Visit: Payer: Self-pay | Admitting: Emergency Medicine

## 2020-08-24 ENCOUNTER — Ambulatory Visit: Payer: Medicare Other

## 2020-08-31 ENCOUNTER — Other Ambulatory Visit: Payer: Self-pay | Admitting: Internal Medicine

## 2020-09-01 ENCOUNTER — Other Ambulatory Visit: Payer: Self-pay | Admitting: Internal Medicine

## 2020-09-06 ENCOUNTER — Telehealth: Payer: Self-pay | Admitting: Pharmacist

## 2020-09-06 NOTE — Chronic Care Management (AMB) (Signed)
I left the patient a message about her upcoming appointment on 09/08/2020 @ 10:00 am with the clinical pharmacist. She was asked to please have all medication on hand to review with the pharmacist.   [name]) [name], [place] [phone]

## 2020-09-07 ENCOUNTER — Ambulatory Visit: Payer: Medicare Other

## 2020-09-07 NOTE — Progress Notes (Deleted)
Chronic Care Management Pharmacy Note  09/07/2020 Name:  [name] MRN:  [id number] DOB:  [birth year]  Subjective: [name] is an 87 y.o. year old female who is a primary patient of Panosh, Standley Brooking, MD.  The CCM team was consulted for assistance with disease management and care coordination needs.    Engaged with patient face to face for follow up visit in response to provider referral for pharmacy case management and/or care coordination services.   Consent to Services:  The patient was given information about Chronic Care Management services, agreed to services, and gave verbal consent prior to initiation of services.  Please see initial visit note for detailed documentation.   Patient Care Team: Panosh, Standley Brooking, MD as PCP - General (Internal Medicine) Nahser, Wonda Cheng, MD as PCP - Cardiology (Cardiology) Marlou Sa Tonna Corner, MD (Orthopedic Surgery) Truitt Merle, MD as Consulting Physician (Hematology) Madelon Lips, MD as Consulting Physician (Nephrology) Monna Fam, MD as Consulting Physician (Ophthalmology) Germaine Pomfret, [facility] as Pharmacist (Pharmacist)  Recent office visits: 07/21/20 [name], MD: Patient presented for chronic conditions follow up. No medication changes.  03/10/20 [name], MD: Patient presented for chronic conditions follow up. Administered Pneumovax 23.  Recent consult visits: 06/16/20 Vonzella Nipple, MD (phys med & rehab): Patient presented for Lumbar radiculopathy. Steroid injection administered.  05/14/20 Marshall Cork (ophthalmology): Unable to access notes.  05/13/20 Gloriann Loan, PA (ortho): Patient presented for back pain. Referral placed for physical medicine & rehab.   Hospital visits: None in previous 6 months  Objective:  Lab Results  Component Value Date   CREATININE 2.45 (H) 07/21/2020   BUN 42 (H) 07/21/2020   GFR 17.65 (L) 07/21/2020   GFRNONAA 19 07/01/2020   GFRAA 22 07/01/2020   NA 143  07/21/2020   K 3.8 07/21/2020   CALCIUM 9.6 07/21/2020   CO2 28 07/21/2020   GLUCOSE 111 (H) 07/21/2020    Lab Results  Component Value Date/Time   HGBA1C 6.0 07/21/2020 12:30 PM   HGBA1C 5.8 (H) 03/10/2020 11:49 AM   GFR 17.65 (L) 07/21/2020 12:30 PM   GFR 23.98 (L) 09/19/2019 11:45 AM   MICROALBUR 10.7 (H) 04/25/2016 10:28 AM    Last diabetic Eye exam:  Lab Results  Component Value Date/Time   HMDIABEYEEXA Retinopathy (A) 06/10/2020 12:00 AM    Last diabetic Foot exam: No results found for: HMDIABFOOTEX   Lab Results  Component Value Date   CHOL 198 07/21/2020   HDL 66.40 07/21/2020   LDLCALC 106 (H) 07/21/2020   TRIG 132.0 07/21/2020   CHOLHDL 3 07/21/2020    Hepatic Function Latest Ref Rng & Units 07/21/2020 11/13/2019 09/19/2019  Total Protein 6.0 - 8.3 g/dL 6.6 - 6.8  Albumin 3.5 - 5.2 g/dL 3.8 3.8 4.0  AST 0 - 37 U/L 16 - 16  ALT 0 - 35 U/L 6 - 6  Alk Phosphatase 39 - 117 U/L 46 - 51  Total Bilirubin 0.2 - 1.2 mg/dL 0.6 - 0.4  Bilirubin, Direct 0.0 - 0.3 mg/dL - - 0.1    Lab Results  Component Value Date/Time   TSH 2.34 09/19/2019 11:45 AM   TSH 1.31 07/09/2017 03:57 PM   FREET4 0.74 07/09/2017 03:57 PM    CBC Latest Ref Rng & Units 07/01/2020 03/10/2020 11/13/2019  WBC - 4.7 4.8 6.9  Hemoglobin 12.0 - 16.0 10.0(A) 9.4(L) 9.5(A)  Hematocrit 36 - 46 31(A) 28.7(L) 29(A)  Platelets 150 - 399 178 269 210  Lab Results  Component Value Date/Time   VD25OH 32.77 07/09/2017 03:57 PM    Clinical ASCVD: {YES/NO:21197} The ASCVD Risk score Mikey Bussing DC Jr., et al., 2013) failed to calculate for the following reasons:   The 2013 ASCVD risk score is only valid for ages 70 to 39    Depression screen PHQ 2/9 07/21/2020 05/21/2019 03/25/2018  Decreased Interest 0 0 0  Down, Depressed, Hopeless 0 0 0  PHQ - 2 Score 0 0 0  Altered sleeping 0 - -  Tired, decreased energy 1 - -  Change in appetite 0 - -  Feeling bad or failure about yourself  0 - -  Trouble concentrating 0  - -  Moving slowly or fidgety/restless 0 - -  Suicidal thoughts 0 - -  PHQ-9 Score 1 - -  Difficult doing work/chores Not difficult at all - -  Some recent data might be hidden     ***Other: (CHADS2VASc if Afib, MMRC or CAT for COPD, ACT, DEXA)  Social History   Tobacco Use  Smoking Status Never Smoker  Smokeless Tobacco Never Used   BP Readings from Last 3 Encounters:  07/21/20 (!) 142/62  06/16/20 (!) 163/65  03/10/20 (!) 152/78   Pulse Readings from Last 3 Encounters:  07/21/20 69  06/16/20 65  03/10/20 60   Wt Readings from Last 3 Encounters:  07/21/20 171 lb (77.6 kg)  03/10/20 174 lb (78.9 kg)  11/24/19 174 lb (78.9 kg)   BMI Readings from Last 3 Encounters:  07/21/20 32.31 kg/m  03/10/20 32.88 kg/m  11/24/19 32.88 kg/m    Assessment/Interventions: Review of patient past medical history, allergies, medications, health status, including review of consultants reports, laboratory and other test data, was performed as part of comprehensive evaluation and provision of chronic care management services.   SDOH:  (Social Determinants of Health) assessments and interventions performed: {yes/no:20286}  SDOH Screenings   Alcohol Screen: Not on file  Depression (PHQ2-9): Low Risk   . PHQ-2 Score: 1  Financial Resource Strain: Low Risk   . Difficulty of Paying Living Expenses: Not hard at all  Food Insecurity: Not on file  Housing: Not on file  Physical Activity: Not on file  Social Connections: Not on file  Stress: Not on file  Tobacco Use: Low Risk   . Smoking Tobacco Use: Never Smoker  . Smokeless Tobacco Use: Never Used  Transportation Needs: No Transportation Needs  . Lack of Transportation (Medical): No  . Lack of Transportation (Non-Medical): No    CCM Care Plan  Allergies  Allergen Reactions  . Penicillins Nausea And Vomiting and Other (See Comments)    Has patient had a PCN reaction causing immediate rash, facial/tongue/throat swelling, SOB or  lightheadedness with hypotension: Y Has patient had a PCN reaction causing severe rash involving mucus membranes or skin necrosis: Y Has patient had a PCN reaction that required hospitalization: N Has patient had a PCN reaction occurring within the last 10 years: N If all of the above answers are "NO", then may proceed with Cephalosporin use.   . Aspirin Other (See Comments)    Wheezing Acetaminophen is OK    Medications Reviewed Today    Reviewed by Burnis Medin, MD (Physician) on 07/22/20 at 1206  Med List Status: <None>  Medication Order Taking? Sig Documenting Provider Last Dose Status Informant  albuterol (PROVENTIL) (2.5 MG/3ML) 0.083% nebulizer solution [id number] Yes USE 1 AMPULE EVERY 6 HOURS AS NEEDED FOR WHEEZE Byrum, Rose Fillers, MD Taking  Active   albuterol (VENTOLIN HFA) 108 (90 Base) MCG/ACT inhaler [id number] Yes USE 2 PUFFS EVERY 6 HOURS AS NEEDED FOR WHEEZING. Collene Gobble, MD Taking Active   amLODipine (NORVASC) 5 MG tablet [id number] Yes Take 1 tablet (5 mg total) by mouth daily. Nahser, Wonda Cheng, MD Taking Active   atorvastatin (LIPITOR) 20 MG tablet [id number] Yes TAKE 1 TABLET ONCE DAILY. Panosh, Standley Brooking, MD Taking Active   Azelastine-Fluticasone [facility]) 137-50 MCG/ACT Elizbeth Squires [id number] Yes Place 1 spray into the nose in the morning and at bedtime. Collene Gobble, MD Taking Active   BREZTRI AEROSPHERE 160-9-4.8 MCG/ACT Hollie Salk [id number] Yes INHALE 2 PUFFS INTO THE LUNGS TWICE DAILY, IN THE MORNING & AT BEDTIME. Collene Gobble, MD Taking Active   carvedilol (COREG) 6.25 MG tablet [id number] Yes Take 6.25 mg by mouth daily. [provider] Taking Active   citalopram (CELEXA) 10 MG tablet [id number] Yes Take 1 tablet (10 mg total) by mouth daily. Panosh, Standley Brooking, MD Taking Active   clopidogrel (PLAVIX) 75 MG tablet [id number]  TAKE 1 TABLET ONCE DAILY. Panosh, Standley Brooking, MD  Active   diclofenac sodium (VOLTAREN) 1 % GEL [id number] Yes APPLY 4GM TO AFFECTED AREA(S) 4 TIMES A  DAY. Panosh, Standley Brooking, MD Taking Active Child  doxazosin (CARDURA) 2 MG tablet [id number]  TAKE 3 TABLETS AT BEDTIME. Panosh, Standley Brooking, MD  Active   ferrous sulfate 325 (65 FE) MG EC tablet [id number] Yes Take 1 tablet (325 mg total) by mouth daily with breakfast. Panosh, Standley Brooking, MD Taking Active Child  fluorometholone (FML) 0.1 % ophthalmic suspension [id number] Yes Place 1 drop into both eyes 2 (two) times daily.  [provider] Taking Active Child  fluticasone (FLONASE) 50 MCG/ACT nasal spray [id number] Yes Place 2 sprays into both nostrils daily. Collene Gobble, MD Taking Active   Fluticasone-Salmeterol (ADVAIR) 250-50 MCG/DOSE AEPB [id number] Yes USE 1 PUFF TWICE DAILY. Panosh, Standley Brooking, MD Taking Active   furosemide (LASIX) 40 MG tablet [id number] Yes Take 80 mg by mouth 3 (three) times daily.  [provider] Taking Active   hydrALAZINE (APRESOLINE) 50 MG tablet [id number] Yes Take 50 mg by mouth 3 (three) times daily. [provider] Taking Active   HYDROcodone-acetaminophen (NORCO/VICODIN) 5-325 MG tablet [id number] Yes Take 1 tablet by mouth daily as needed for moderate pain. Magnant, Gerrianne Scale, PA-C Taking Active   lactobacillus acidophilus & bulgar (LACTINEX) chewable tablet [id number] Yes Chew 1 tablet by mouth 3 (three) times daily with meals. Chase Picket, MD Taking Active Child  lidocaine (LIDODERM) 5 % [id number] Yes Place 1 patch onto the skin daily. Remove & Discard patch within 12 hours or as directed by MD Lysbeth Penner, FNP Taking Active Child  loratadine (CLARITIN) 10 MG tablet [id number] Yes Take 1 tablet (10 mg total) by mouth daily.  Patient taking differently: Take 10 mg by mouth daily as needed.   Collene Gobble, MD Taking Active   LORazepam (ATIVAN) 1 MG tablet [id number] Yes TAKE 1 TABLET EVERY 8 HOURS AS NEEDED FOR ANXIETY. AVOID REGULAR USE. Panosh, Standley Brooking, MD Taking Active   montelukast (SINGULAIR) 10 MG tablet [id number] Yes TAKE 1 TABLET ONCE  DAILY. Panosh, Standley Brooking, MD Taking Active   ondansetron (ZOFRAN ODT) 4 MG disintegrating tablet [id number] Yes Take 1 tablet (4 mg total) by mouth every 8 (eight) hours as needed for nausea or vomiting. Chase Picket, MD Taking Active Child  pantoprazole (PROTONIX) 40 MG tablet  [id number] Yes TAKE (1) TABLET TWICE A DAY BEFORE MEALS. Panosh, Standley Brooking, MD Taking Active   potassium chloride (KLOR-CON) 10 MEQ tablet [id number] Yes TAKE 4 TABLETS DAILY. Panosh, Standley Brooking, MD Taking Active   RESTASIS 0.05 % ophthalmic emulsion [id number] Yes Place 1 drop into both eyes 2 (two) times daily.  [provider] Taking Active Child           Med Note Gershon Cull Nov 04, 2018 10:27 AM)    Spacer/Aero-Holding Chambers (AEROCHAMBER PLUS) inhaler [id number] Yes Use as instructed Melynda Ripple, MD Taking Active Child  vitamin B-12 (CYANOCOBALAMIN) 1000 MCG tablet [id number] Yes Take 1,000 mcg by mouth daily. [provider] Taking Active Child  Vitamin D, Cholecalciferol, 25 MCG (1000 UT) TABS [id number] Yes Take 1,000 Units by mouth daily. [provider] Taking Active           Patient Active Problem List   Diagnosis Date Noted  . Chronic rhinitis 11/24/2019  . Nausea 11/03/2018  . Elevated troponin 11/03/2018  . Hyponatremia 11/03/2018  . Hypertensive heart disease with hypertensive chronic kidney disease 11/30/2017  . Fasting hyperglycemia 11/30/2017  . Renal cyst 03/27/2016  . CKD (chronic kidney disease) stage 3, GFR 30-59 ml/min (HCC) 08/23/2015  . Perceived hearing changes 06/29/2014  . Resistant hypertension 06/29/2014  . Lumbago 06/15/2014  . Pre-diabetes vs early DM  06/15/2014  . Dyspnea 04/27/2014  . Asthma, chronic 04/13/2014  . Elevated uric acid in blood 04/13/2014  . Essential hypertension 03/27/2014  . History of DVT (deep vein thrombosis)   . Palpitations 01/05/2014  . Anemia of chronic disease 2014/01/03  . Death of family member Sep 07, 2013   . Leg edema 06/20/2013  . Bereavement due to life event 04/21/2013  . Other dysphagia 02/11/2013  . Colon cancer screening 02/11/2013  . Anxiety state 01/20/2013  . Leg cramps 01/20/2013  . Back pain 12/05/2012  . Family hx of colon cancer 10/21/2012  . Family hx of lung cancer 10/21/2012  . Family hx-breast malignancy 10/21/2012  . Renal insufficiency creatinine 1.3 10/21/2012  . Anemia, chronic disease 10/21/2012  . GERD (gastroesophageal reflux disease) 09/08/2012  . HTN (hypertension) 09/08/2012  . Hyperlipidemia 09/08/2012  . Varicose veins of leg with complications 60/63/0160    Immunization History  Administered Date(s) Administered  . Fluad Quad(high Dose 65+) 01/17/2019, 02/16/2020  . Influenza, High Dose Seasonal PF 02/17/2014, 03/01/2015, 02/21/2016, 03/12/2017, 01/31/2018  . Influenza,inj,Quad PF,6+ Mos 01/20/2013  . Moderna Sars-Covid-2 Vaccination 04/29/2020  . PFIZER(Purple Top)SARS-COV-2 Vaccination 06/07/2019, 06/28/2019  . Pneumococcal Conjugate-13 02/21/2016  . Pneumococcal Polysaccharide-23 03/10/2020  . Pneumococcal-Unspecified 01/13/2009  . Td 04/03/2014  . Zoster Recombinat (Shingrix) 02/12/2019, 04/18/2019    Conditions to be addressed/monitored:  Hypertension, Hyperlipidemia, Coronary Artery Disease, GERD, Asthma, Anxiety and Anemia  There are no care plans that you recently modified to display for this patient.   Current Barriers:  . {pharmacybarriers:24917}  Pharmacist Clinical Goal(s):  [name] Patient will {PHARMACYGOALCHOICES:24921} through collaboration with PharmD and provider.   Interventions: . 1:1 collaboration with Panosh, Standley Brooking, MD regarding development and update of comprehensive plan of care as evidenced by provider attestation and co-signature . Inter-disciplinary care team collaboration (see longitudinal plan of care) . Comprehensive medication review performed; medication list updated in electronic medical record  BP Readings  from Last 3 Encounters:  07/21/20 (!) 142/62  06/16/20 (!) 163/65  03/10/20 (!) 152/78     Hypertension (BP goal <140/90) -Uncontrolled -Current treatment:  Amlodipine  5 mg daily   Carvedilol 6.25 mg twice daily  Doxazosin 2 mg 3 tabs at bedtime  Furosemide 40 mg 2 tabs three times daily  Hydralazine 50 mg three times daily -Medications previously tried: ***  -Current home readings: *** -Current dietary habits: *** -Current exercise habits: *** -{ACTIONS;DENIES/REPORTS:21021675::"Denies"} hypotensive/hypertensive symptoms -Educated on {CCM BP Counseling:25124} -Counseled to monitor BP at home ***, document, and provide log at future appointments -{CCMPHARMDINTERVENTION:25122}  Lab Results  Component Value Date   CHOL 198 07/21/2020   HDL 66.40 07/21/2020   LDLCALC 106 (H) 07/21/2020   TRIG 132.0 07/21/2020   CHOLHDL 3 07/21/2020    Hyperlipidemia: (LDL goal < 100) -Uncontrolled -Current treatment:  Atorvastatin 20 mg daily  -Medications previously tried: fenofibrate  -Current dietary patterns: *** -Current exercise habits: *** -Educated on {CCM HLD Counseling:25126} -{CCMPHARMDINTERVENTION:25122}  Asthma (Goal: control symptoms) -{US controlled/uncontrolled:25276} -Current treatment   Proventil 0.083% nebulizer solution  Ventolin HFA 2 puff q6hr PRN   Breztri 2 puffs twice daily   Montelukast 10 mg daily  -Medications previously tried: Advair -Pulmonary function testing: 66% (11/26/17) -Patient {Actions; denies-reports:120008} consistent use of maintenance inhaler -Frequency of rescue inhaler use: *** -Counseled on {CCMINHALERCOUNSELING:25121} -{CCMPHARMDINTERVENTION:25122}  Anxiety (Goal: ***) -{US controlled/uncontrolled:25276} -Current treatment:  Citalopram 10 mg daily   Lorazepam 1 mg 1 tablet every 8 hours as needed -Medications previously tried/failed: *** -PHQ9: *** -GAD7: *** -Connected with *** for mental health support -Educated  on {CCM mental health counseling:25127} -{CCMPHARMDINTERVENTION:25122}  Allergic rhinitis (Goal: ***) -{US controlled/uncontrolled:25276} -Current treatment   Dymista 1 spray twice daily   Loratidine 10 mg daily -Medications previously tried: ***  -{CCMPHARMDINTERVENTION:25122}  GERD (Goal: ***) -{US controlled/uncontrolled:25276} -Current treatment   Pantoprazole 40 mg twice daily  -Medications previously tried: ***  -{CCMPHARMDINTERVENTION:25122}  -still having breakthrough symptoms?  Chronic pain (Goal: ***) -{US controlled/uncontrolled:25276} -Current treatment   Acetaminophen 500 mg 2 tabs every 6 hours as needed   Acetaminophen-Codeine 300-30 mg 1 tab daily as needed ( made her sick)   Hydrocodone-Acetaminophen 5-325 every 12 hours as needed  Lidocaine 5% patch daily   Voltaren 1% gel  -Medications previously tried: ***  -{CCMPHARMDINTERVENTION:25122}   History of DVT (Goal: ***) -{US controlled/uncontrolled:25276} -Current treatment   Clopidogrel 75 mg daily  -Medications previously tried: Aspirin (Wheezing)  -{CCMPHARMDINTERVENTION:25122}  Anemia (Goal: ***) -{US controlled/uncontrolled:25276} -Current treatment   Ferrous Sulfate 325 mg daily  -Medications previously tried: ***  -{CCMPHARMDINTERVENTION:25122}   Health Maintenance -Vaccine gaps: none -Current therapy:   Lactinex 1 chew three times daily  Ondansetron ODT 4 mg every 8 hours as needed   Potassium 10 meQ 4 tabs daily   Restasis ophthalmic 1 drop twice daily   Vitamin B12 1000 mcg daily   Vitamin D 1000 units daily  -Educated on {ccm supplement counseling:25128} -{CCM Patient satisfied:25129} -{CCMPHARMDINTERVENTION:25122}   Patient Goals/Self-Care Activities . Patient will:  - {pharmacypatientgoals:24919}  Follow Up Plan: {CM FOLLOW UP AJGO:11572}   Medication Assistance: {MEDASSISTANCEINFO:25044}  Patient's preferred pharmacy is:  [place], [place] [address] Phone: [phone] Fax: [phone]  [id number] [place] [id number]: [phone]: [phone] Uses pill box? {Yes or If no, why not?:20788} Pt endorses ***% compliance  We discussed: {Pharmacy options:24294} [facility] Up  Patient Decision:  {FOLLOWUP:24991}  Plan: {CM FOLLOW UP RKYB:53391}  [name], PharmD [place] Pharmacist [place] at [place] [phone]

## 2020-09-09 ENCOUNTER — Telehealth: Payer: Self-pay | Admitting: Internal Medicine

## 2020-09-09 NOTE — Telephone Encounter (Signed)
Tried calling patient to  schedule Medicare Annual Wellness Visit (AWV) either virtually or in office.  No answer could not leave message  AWV-I per [place] 05/15/09  please schedule at anytime with [facility] Nurse Health Advisor 1 or 2   This should be a 45 minute visit.

## 2020-09-19 ENCOUNTER — Other Ambulatory Visit: Payer: Self-pay | Admitting: Internal Medicine
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
## 2020-09-22 ENCOUNTER — Ambulatory Visit: Payer: Medicare Other | Admitting: Physician Assistant

## 2020-09-22 ENCOUNTER — Other Ambulatory Visit: Payer: Self-pay

## 2020-09-22 ENCOUNTER — Encounter: Payer: Self-pay | Admitting: Physician Assistant

## 2020-09-22 VITALS — BP 162/70 | HR 58 | Ht 61.0 in | Wt 177.4 lb

## 2020-09-22 DIAGNOSIS — I5033 Acute on chronic diastolic (congestive) heart failure: Secondary | ICD-10-CM | POA: Diagnosis not present

## 2020-09-22 DIAGNOSIS — I5032 Chronic diastolic (congestive) heart failure: Secondary | ICD-10-CM

## 2020-09-22 DIAGNOSIS — I1310 Hypertensive heart and chronic kidney disease without heart failure, with stage 1 through stage 4 chronic kidney disease, or unspecified chronic kidney disease: Secondary | ICD-10-CM

## 2020-09-22 DIAGNOSIS — I272 Pulmonary hypertension, unspecified: Secondary | ICD-10-CM

## 2020-09-22 DIAGNOSIS — E782 Mixed hyperlipidemia: Secondary | ICD-10-CM

## 2020-09-22 MED ORDER — HYDRALAZINE HCL 50 MG PO TABS: 75.0000 mg | ORAL_TABLET | Freq: Three times a day (TID) | ORAL | 3 refills | Status: DC

## 2020-09-22 NOTE — Patient Instructions (Addendum)
  Medication Instructions:  Your physician has recommended you make the following change in your medication:  1. INCREASE LASIX TO 80 MG TWICE DAILY FOR 2-3 DAYS, THEN DECREASE BACK TO 80 MG IN THE AM AND 40 MG IN THE PM.  2. INCREASE HYDRALAZINE TO 75 MG TWICE DAILY.  *If you need a refill on your cardiac medications before your next appointment, please call your pharmacy*   Lab Work: NONE If you have labs (blood work) drawn today and your tests are completely normal, you will receive your results only by: [name] MyChart Message (if you have MyChart) OR . A paper copy in the mail If you have any lab test that is abnormal or we need to change your treatment, we will call you to review the results.   Testing/Procedures: NONE   Follow-Up: At [facility], you and your health needs are our priority.  As part of our continuing mission to provide you with exceptional heart care, we have created designated Provider Care Teams.  These Care Teams include your primary Cardiologist (physician) and Advanced Practice Providers (APPs -  Physician Assistants and Nurse Practitioners) who all work together to provide you with the care you need, when you need it.  We recommend signing up for the patient portal called "MyChart".  Sign up information is provided on this After Visit Summary.  MyChart is used to connect with patients for Virtual Visits (Telemedicine).  Patients are able to view lab/test results, encounter notes, upcoming appointments, etc.  Non-urgent messages can be sent to your provider as well.   To learn more about what you can do with MyChart, go to [email or web address].    Your next appointment:   6 month(s)  The format for your next appointment:   In Person  Provider:   You may see Mertie Moores, MD or one of the following Advanced Practice Providers on your designated Care Team:    Richardson Dopp, PA-C  [place], [place]   Please check your blood pressure 1 timeper day and  call if blood pressure is greater than 130/80

## 2020-09-25 ENCOUNTER — Other Ambulatory Visit: Payer: Self-pay | Admitting: Emergency Medicine

## 2020-09-28 ENCOUNTER — Other Ambulatory Visit: Payer: Self-pay | Admitting: Internal Medicine

## 2020-10-01 ENCOUNTER — Other Ambulatory Visit: Payer: Self-pay | Admitting: Internal Medicine

## 2020-10-01 ENCOUNTER — Other Ambulatory Visit: Payer: Self-pay | Admitting: Cardiovascular Disease

## 2020-10-04 ENCOUNTER — Other Ambulatory Visit: Payer: Self-pay

## 2020-10-04 MED ORDER — HYDRALAZINE HCL 50 MG PO TABS: 75.0000 mg | ORAL_TABLET | Freq: Three times a day (TID) | ORAL | 3 refills | Status: DC

## 2020-10-04 NOTE — Telephone Encounter (Signed)
Pt's medication was sent to pt's pharmacy as requested. Confirmation received.  °

## 2020-10-12 ENCOUNTER — Other Ambulatory Visit: Payer: Self-pay | Admitting: Emergency Medicine

## 2020-10-14 ENCOUNTER — Telehealth: Payer: Self-pay | Admitting: Pharmacist

## 2020-10-14 NOTE — Chronic Care Management (AMB) (Signed)
Chronic Care Management Pharmacy Assistant   Name: [name]  MRN: [id number] DOB: [birth year]   Reason for Encounter: Disease State   Conditions to be addressed/monitored: Asthma  Recent office visits:  None  Recent consult visits:  o 05.11.2022 Liliane Shi, PA-C Cardiology patient present for follow-upon hypertension and CHF. Medication changes-Hydralazine (APRESOLINE) 50 mg increased to 75 mg daily. Medication that was discontinued was Fluticasone-Salmeterol (ADVAIR) 250-50 MCG/DOSE AEPB, Fluticasone-Salmeterol (ADVAIR) 250-50 MCG/DOSE AEPB, and Ondansetron (ZOFRAN ODT) 4 MG.  Hospital visits:  None in previous 6 months  Medications: Outpatient Encounter Medications as of 10/14/2020  Medication Sig  . albuterol (PROVENTIL) (2.5 MG/3ML) 0.083% nebulizer solution USE 1 AMPULE EVERY 6 HOURS AS NEEDED FOR WHEEZE  . albuterol (VENTOLIN HFA) 108 (90 Base) MCG/ACT inhaler USE 2 PUFFS EVERY 6 HOURS AS NEEDED FOR WHEEZING.  [name] amLODipine (NORVASC) 5 MG tablet TAKE 1 TABLET BY MOUTH DAILY.  [name] atorvastatin (LIPITOR) 20 MG tablet TAKE 1 TABLET ONCE DAILY.  [name] Azelastine-Fluticasone (DYMISTA) 137-50 MCG/ACT SUSP Place 1 spray into the nose in the morning and at bedtime. (Patient not taking: Reported on 09/22/2020)  . BREZTRI AEROSPHERE 160-9-4.8 MCG/ACT AERO INHALE 2 PUFFS INTO THE LUNGS TWICE DAILY, IN THE MORNING & AT BEDTIME.  . carvedilol (COREG) 6.25 MG tablet Take 6.25 mg by mouth daily.  . citalopram (CELEXA) 10 MG tablet TAKE 1 TABLET ONCE DAILY.  [name] clopidogrel (PLAVIX) 75 MG tablet TAKE 1 TABLET ONCE DAILY.  [name] diclofenac sodium (VOLTAREN) 1 % GEL APPLY 4GM TO AFFECTED AREA(S) 4 TIMES A DAY.  [name] doxazosin (CARDURA) 2 MG tablet TAKE 3 TABLETS AT BEDTIME.  . ferrous sulfate 325 (65 FE) MG EC tablet Take 1 tablet (325 mg total) by mouth daily with breakfast.  . fluorometholone (FML) 0.1 % ophthalmic suspension Place 1 drop into both eyes 2 (two) times daily.   . fluticasone  (FLONASE) 50 MCG/ACT nasal spray Place 2 sprays into both nostrils daily.  . furosemide (LASIX) 40 MG tablet Take by mouth. Take 80 mg twice for 2-3 days, then decrease to 80 mg in the am and 40 mg in the pm  . hydrALAZINE (APRESOLINE) 50 MG tablet Take 1.5 tablets (75 mg total) by mouth 3 (three) times daily.  [name] HYDROcodone-acetaminophen (NORCO/VICODIN) 5-325 MG tablet Take 1 tablet by mouth daily as needed for moderate pain.  [name] lidocaine (LIDODERM) 5 % Place 1 patch onto the skin daily. Remove & Discard patch within 12 hours or as directed by MD  . loratadine (CLARITIN) 10 MG tablet Take 1 tablet (10 mg total) by mouth daily. (Patient taking differently: Take 10 mg by mouth daily as needed.)  . LORazepam (ATIVAN) 1 MG tablet TAKE 1 TABLET EVERY 8 HOURS AS NEEDED FOR ANXIETY. AVOID REGULAR USE.  . montelukast (SINGULAIR) 10 MG tablet TAKE 1 TABLET ONCE DAILY.  . pantoprazole (PROTONIX) 40 MG tablet TAKE (1) TABLET TWICE A DAY BEFORE MEALS.  [name] potassium chloride (KLOR-CON) 10 MEQ tablet TAKE FOUR TABLETS BY MOUTH DAILY.  [name] RESTASIS 0.05 % ophthalmic emulsion Place 1 drop into both eyes 2 (two) times daily.   [name] Spacer/Aero-Holding Chambers (AEROCHAMBER PLUS) inhaler Use as instructed  . vitamin B-12 (CYANOCOBALAMIN) 1000 MCG tablet Take 1,000 mcg by mouth daily.  . Vitamin D, Cholecalciferol, 25 MCG (1000 UT) TABS Take 1,000 Units by mouth daily.   No facility-administered encounter medications on file as of 10/14/2020.   . Current Asthma regimen:  o Proventil 0.083%  nebulizer solution o Ventolin HFA 2 puff q6hr PRN  o Breztri 2 puffs twice daily  o Montelukast 10 mg daily . Any recent hospitalizations or ED visits since last visit with CPP? No . Reports Asthma symptoms, including Increased shortness of breath  and Symptoms worse at night . What recent interventions/DTPs have been made by any provider to improve breathing since last visit: . Have you had exacerbation/flare-up since last visit?  Yes . What do you do when you are short of breath?  Rescue medication  Respiratory Devices/Equipment . Do you have a nebulizer? Yes . Do you use a Peak Flow Meter? No . Do you use a maintenance inhaler? Yes . How often do you forget to use your daily inhaler? None . Do you use a rescue inhaler? Yes . How often do you use your rescue inhaler?  1-2x per week . Do you use a spacer with your inhaler? Yes  Adherence Review: . Does the patient have >5 day gap between last estimated fill date for maintenance inhaler medications? No I spoke with the patient about medication adherence. She stated that she has been doing OK. She states that spring and summer are difficult times for her asthma due to the pollen count. She continues to take her medication as prescribed. She has last seen in cardiology on 09/22/2020 and her Hydralazine 50 mg was increased to 75 mg daily. She states that she is not experiencing any side effects from her current medication. No other changes to her current medications. She stated that she will have a follow-up appointment with pulmonary in the next couple of months. She denies any emergency department visits or urgent care visits since her last CPP or PCP appointment. She denies any problems with her current pharmacy.  Star Rating Drugs: Medication Dispensed Quantity Pharmacy  Atorvastatin 20 mg 02.17.2022 [place] [facility] [name], [place] Pharmacist Assistant [phone]

## 2020-10-18 DIAGNOSIS — H4710 Unspecified papilledema: Secondary | ICD-10-CM | POA: Diagnosis not present

## 2020-10-18 DIAGNOSIS — H2000 Unspecified acute and subacute iridocyclitis: Secondary | ICD-10-CM | POA: Diagnosis not present

## 2020-10-27 ENCOUNTER — Other Ambulatory Visit: Payer: Self-pay | Admitting: Internal Medicine

## 2020-10-27 DIAGNOSIS — Z1231 Encounter for screening mammogram for malignant neoplasm of breast: Secondary | ICD-10-CM

## 2020-10-28 ENCOUNTER — Telehealth: Payer: Self-pay | Admitting: Internal Medicine

## 2020-10-28 NOTE — Telephone Encounter (Signed)
Left message for patient to call back and schedule Medicare Annual Wellness Visit (AWV) either virtually or in office.   AWV-I per [place] 05/15/09 please schedule at anytime with [facility] Nurse Health Advisor 1 or 2   This should be a 45 minute visit. 

## 2020-11-03 ENCOUNTER — Other Ambulatory Visit: Payer: Self-pay | Admitting: Internal Medicine

## 2020-11-04 DIAGNOSIS — N390 Urinary tract infection, site not specified: Secondary | ICD-10-CM | POA: Diagnosis not present

## 2020-11-04 DIAGNOSIS — I8393 Asymptomatic varicose veins of bilateral lower extremities: Secondary | ICD-10-CM | POA: Diagnosis not present

## 2020-11-04 DIAGNOSIS — N184 Chronic kidney disease, stage 4 (severe): Secondary | ICD-10-CM | POA: Diagnosis not present

## 2020-11-04 DIAGNOSIS — N189 Chronic kidney disease, unspecified: Secondary | ICD-10-CM | POA: Diagnosis not present

## 2020-11-04 DIAGNOSIS — D631 Anemia in chronic kidney disease: Secondary | ICD-10-CM | POA: Diagnosis not present

## 2020-11-04 DIAGNOSIS — I129 Hypertensive chronic kidney disease with stage 1 through stage 4 chronic kidney disease, or unspecified chronic kidney disease: Secondary | ICD-10-CM | POA: Diagnosis not present

## 2020-11-04 DIAGNOSIS — I739 Peripheral vascular disease, unspecified: Secondary | ICD-10-CM | POA: Diagnosis not present

## 2020-11-04 DIAGNOSIS — N2581 Secondary hyperparathyroidism of renal origin: Secondary | ICD-10-CM | POA: Diagnosis not present

## 2020-11-04 LAB — COMPREHENSIVE METABOLIC PANEL
Albumin: 3.8 (ref 3.5–5.0)
Calcium: 8.8 (ref 8.7–10.7)
GFR calc Af Amer: 19

## 2020-11-04 LAB — BASIC METABOLIC PANEL
BUN: 43 — AB (ref 4–21)
CO2: 27 — AB (ref 13–22)
Chloride: 106 (ref 99–108)
Creatinine: 2.4 — AB (ref 0.5–1.1)
Glucose: 108
Potassium: 3.7 (ref 3.4–5.3)
Sodium: 142 (ref 137–147)

## 2020-11-04 LAB — FECAL OCCULT BLOOD, GUAIAC: Fecal Occult Blood: NEGATIVE

## 2020-11-04 LAB — MICROALBUMIN, URINE: Microalb, Ur: 103.8

## 2020-11-10 ENCOUNTER — Other Ambulatory Visit: Payer: Self-pay | Admitting: Internal Medicine

## 2020-11-16 ENCOUNTER — Other Ambulatory Visit: Payer: Self-pay | Admitting: Internal Medicine

## 2020-11-18 ENCOUNTER — Encounter: Payer: Self-pay | Admitting: Internal Medicine

## 2020-11-29 ENCOUNTER — Telehealth: Payer: Self-pay | Admitting: Pharmacist

## 2020-11-29 NOTE — Chronic Care Management (AMB) (Signed)
    Chronic Care Management Pharmacy Assistant   Name: Tiler Forgy  MRN: [id number] DOB: 1934/01/14  11/29/2020- Patient called to remind of appointment with 11/30/2020 at 11:30 am with [name], CPP  No answer, left message of appointment date, time and type of appointment (either telephone or in person). Left message to have all medications, supplements, blood pressure and/or blood sugar logs available during appointment and to return call if need to reschedule.  SIG:  Pattricia Boss, [place] Pharmacist Assistant [phone]

## 2020-11-30 ENCOUNTER — Other Ambulatory Visit: Payer: Self-pay

## 2020-11-30 ENCOUNTER — Ambulatory Visit (INDEPENDENT_AMBULATORY_CARE_PROVIDER_SITE_OTHER): Payer: Medicare Other | Admitting: Pharmacist

## 2020-11-30 DIAGNOSIS — J45909 Unspecified asthma, uncomplicated: Secondary | ICD-10-CM

## 2020-11-30 DIAGNOSIS — I10 Essential (primary) hypertension: Secondary | ICD-10-CM | POA: Diagnosis not present

## 2020-11-30 DIAGNOSIS — N184 Chronic kidney disease, stage 4 (severe): Secondary | ICD-10-CM

## 2020-11-30 NOTE — Progress Notes (Signed)
Chronic Care Management Pharmacy Note  12/15/2020 Name:  Shivonne Schwartzman MRN:  [id number] DOB:  1933-06-25  Summary: BP not at goal < 140/90 per office readings  Recommendations/Changes made from today's visit: -Recommended checking BP at home twice weekly -Recommended switching carvedilol 6.25 mg daily to metoprolol succinate 25 mg daily due to asthma and current SOB   Plan: Follow up BP assessment in 3-4 weeks   Subjective: Baker Kogler is an 87 y.o. year old female who is a primary patient of Panosh, Standley Brooking, MD.  The CCM team was consulted for assistance with disease management and care coordination needs.    Engaged with patient face to face for follow up visit in response to provider referral for pharmacy case management and/or care coordination services.   Consent to Services:  The patient was given information about Chronic Care Management services, agreed to services, and gave verbal consent prior to initiation of services.  Please see initial visit note for detailed documentation.   Patient Care Team: Panosh, Standley Brooking, MD as PCP - General (Internal Medicine) Nahser, Wonda Cheng, MD as PCP - Cardiology (Cardiology) Marlou Sa Tonna Corner, MD (Orthopedic Surgery) Truitt Merle, MD as Consulting Physician (Hematology) Madelon Lips, MD as Consulting Physician (Nephrology) Monna Fam, MD as Consulting Physician (Ophthalmology) Viona Gilmore, [facility] as Pharmacist (Pharmacist)  Recent office visits: 07/22/31 [name], MD: Patient presented for CKD and GERD follow up.   Recent consult visits: 09/22/20 Sharmon Revere (Cardiology): patient presented for follow-up on hypertension and CHF. Increased hydralazine to 75 mg daily. Increased furosemide to 80 mg for 2-3 days. Removed Advair and Zofran from medication list.   07/22/20 Chirstopher Waver (ophthalmology): Unable to access notes.  07/01/20 Madelon Lips (nephrology): Unable to access notes.  06/16/20  Vonzella Nipple, MD (Physical med & rehab): Patient presented for steroid injection.     Hospital visits: None in previous 6 months   Objective:  Lab Results  Component Value Date   CREATININE 2.4 (A) 11/04/2020   BUN 43 (A) 11/04/2020   GFR 17.65 (L) 07/21/2020   GFRNONAA 19 07/01/2020   GFRAA 19 11/04/2020   NA 142 11/04/2020   K 3.7 11/04/2020   CALCIUM 8.8 11/04/2020   CO2 27 (A) 11/04/2020   GLUCOSE 111 (H) 07/21/2020    Lab Results  Component Value Date/Time   HGBA1C 6.0 07/21/2020 12:30 PM   HGBA1C 5.8 (H) 03/10/2020 11:49 AM   GFR 17.65 (L) 07/21/2020 12:30 PM   GFR 23.98 (L) 09/19/2019 11:45 AM   MICROALBUR 103.8 11/04/2020 12:00 AM   MICROALBUR 10.7 (H) 04/25/2016 10:28 AM    Last diabetic Eye exam:  Lab Results  Component Value Date/Time   HMDIABEYEEXA Retinopathy (A) 06/10/2020 12:00 AM    Last diabetic Foot exam: No results found for: HMDIABFOOTEX   Lab Results  Component Value Date   CHOL 198 07/21/2020   HDL 66.40 07/21/2020   LDLCALC 106 (H) 07/21/2020   TRIG 132.0 07/21/2020   CHOLHDL 3 07/21/2020    Hepatic Function Latest Ref Rng & Units 11/04/2020 07/21/2020 11/13/2019  Total Protein 6.0 - 8.3 g/dL - 6.6 -  Albumin 3.5 - 5.0 3.8 3.8 3.8  AST 0 - 37 U/L - 16 -  ALT 0 - 35 U/L - 6 -  Alk Phosphatase 39 - 117 U/L - 46 -  Total Bilirubin 0.2 - 1.2 mg/dL - 0.6 -  Bilirubin, Direct 0.0 - 0.3 mg/dL - - -  Lab Results  Component Value Date/Time   TSH 2.34 09/19/2019 11:45 AM   TSH 1.31 07/09/2017 03:57 PM   FREET4 0.74 07/09/2017 03:57 PM    CBC Latest Ref Rng & Units 07/01/2020 03/10/2020 11/13/2019  WBC - 4.7 4.8 6.9  Hemoglobin 12.0 - 16.0 10.0(A) 9.4(L) 9.5(A)  Hematocrit 36 - 46 31(A) 28.7(L) 29(A)  Platelets 150 - 399 178 269 210    Lab Results  Component Value Date/Time   VD25OH 32.77 07/09/2017 03:57 PM    Clinical ASCVD: No  The ASCVD Risk score Mikey Bussing DC Jr., et al., 2013) failed to calculate for the following reasons:    The 2013 ASCVD risk score is only valid for ages 26 to 73    Depression screen PHQ 2/9 07/21/2020 05/21/2019 03/25/2018  Decreased Interest 0 0 0  Down, Depressed, Hopeless 0 0 0  PHQ - 2 Score 0 0 0  Altered sleeping 0 - -  Tired, decreased energy 1 - -  Change in appetite 0 - -  Feeling bad or failure about yourself  0 - -  Trouble concentrating 0 - -  Moving slowly or fidgety/restless 0 - -  Suicidal thoughts 0 - -  PHQ-9 Score 1 - -  Difficult doing work/chores Not difficult at all - -  Some recent data might be hidden     Social History   Tobacco Use  Smoking Status Never  Smokeless Tobacco Never   BP Readings from Last 3 Encounters:  09/22/20 (!) 162/70  07/21/20 (!) 142/62  06/16/20 (!) 163/65   Pulse Readings from Last 3 Encounters:  09/22/20 (!) 58  07/21/20 69  06/16/20 65   Wt Readings from Last 3 Encounters:  09/22/20 177 lb 6.4 oz (80.5 kg)  07/21/20 171 lb (77.6 kg)  03/10/20 174 lb (78.9 kg)   BMI Readings from Last 3 Encounters:  09/22/20 33.52 kg/m  07/21/20 32.31 kg/m  03/10/20 32.88 kg/m    Assessment/Interventions: Review of patient past medical history, allergies, medications, health status, including review of consultants reports, laboratory and other test data, was performed as part of comprehensive evaluation and provision of chronic care management services.   SDOH:  (Social Determinants of Health) assessments and interventions performed: No  SDOH Screenings   Alcohol Screen: Not on file  Depression (PHQ2-9): Low Risk    PHQ-2 Score: 1  Financial Resource Strain: Low Risk    Difficulty of Paying Living Expenses: Not hard at all  Food Insecurity: Not on file  Housing: Not on file  Physical Activity: Not on file  Social Connections: Not on file  Stress: Not on file  Tobacco Use: Low Risk    Smoking Tobacco Use: Never   Smokeless Tobacco Use: Never  Transportation Needs: No Transportation Needs   Lack of Transportation (Medical):  No   Lack of Transportation (Non-Medical): No    CCM Care Plan  Allergies  Allergen Reactions   Penicillins Nausea And Vomiting and Other (See Comments)    Has patient had a PCN reaction causing immediate rash, facial/tongue/throat swelling, SOB or lightheadedness with hypotension: Y Has patient had a PCN reaction causing severe rash involving mucus membranes or skin necrosis: Y Has patient had a PCN reaction that required hospitalization: N Has patient had a PCN reaction occurring within the last 10 years: N If all of the above answers are "NO", then may proceed with Cephalosporin use.    Aspirin Other (See Comments)    Wheezing Acetaminophen is OK  Medications Reviewed Today     Reviewed by Jacinta Shoe, [place] (Certified Medical Assistant) on 09/22/20 at [place] List Status: <None>   Medication Order Taking? Sig Documenting Provider Last Dose Status Informant  albuterol (PROVENTIL) (2.5 MG/3ML) 0.083% nebulizer solution [id number]  USE 1 AMPULE EVERY 6 HOURS AS NEEDED FOR WHEEZE Byrum, Rose Fillers, MD  Active   albuterol (VENTOLIN HFA) 108 (90 Base) MCG/ACT inhaler [id number]  USE 2 PUFFS EVERY 6 HOURS AS NEEDED FOR WHEEZING. Collene Gobble, MD  Active   amLODipine (NORVASC) 5 MG tablet [id number]  Take 1 tablet (5 mg total) by mouth daily. Nahser, Wonda Cheng, MD  Active   atorvastatin (LIPITOR) 20 MG tablet [id number]  TAKE 1 TABLET ONCE DAILY. Panosh, Standley Brooking, MD  Active   Azelastine-Fluticasone [facility]) 137-50 MCG/ACT SUSP [id number] No Place 1 spray into the nose in the morning and at bedtime.  Patient not taking: Reported on 09/22/2020   Collene Gobble, MD Not Taking Active   BREZTRI AEROSPHERE 160-9-4.8 MCG/ACT AERO [id number]  INHALE 2 PUFFS INTO THE LUNGS TWICE DAILY, IN THE MORNING & AT BEDTIME. Collene Gobble, MD  Active   carvedilol (COREG) 6.25 MG tablet [id number]  Take 6.25 mg by mouth daily. [provider]  Active   citalopram (CELEXA) 10 MG tablet [id number]   TAKE 1 TABLET ONCE DAILY. Panosh, Standley Brooking, MD  Active   clopidogrel (PLAVIX) 75 MG tablet [id number]  TAKE 1 TABLET ONCE DAILY. Panosh, Standley Brooking, MD  Active   diclofenac sodium (VOLTAREN) 1 % GEL [id number]  APPLY 4GM TO AFFECTED AREA(S) 4 TIMES A DAY. Panosh, Standley Brooking, MD  Active Child  doxazosin (CARDURA) 2 MG tablet [id number]  TAKE 3 TABLETS AT BEDTIME. Panosh, Standley Brooking, MD  Active   ferrous sulfate 325 (65 FE) MG EC tablet [id number]  Take 1 tablet (325 mg total) by mouth daily with breakfast. Panosh, Standley Brooking, MD  Active Child  fluorometholone (FML) 0.1 % ophthalmic suspension [id number]  Place 1 drop into both eyes 2 (two) times daily.  [provider]  Active Child  fluticasone (FLONASE) 50 MCG/ACT nasal spray [id number]  Place 2 sprays into both nostrils daily. Collene Gobble, MD  Active   furosemide (LASIX) 40 MG tablet [id number]  Take by mouth. Take 80 mg twice for 2-3 days, then decrease to 80 mg in the am and 40 mg in the pm [provider]  Active   hydrALAZINE (APRESOLINE) 50 MG tablet [id number] Yes Take 1.5 tablets (75 mg total) by mouth 3 (three) times daily. Richardson Dopp T, PA-C  Active   HYDROcodone-acetaminophen (NORCO/VICODIN) 5-325 MG tablet [id number]  Take 1 tablet by mouth daily as needed for moderate pain. Magnant, Gerrianne Scale, PA-C  Active   lidocaine (LIDODERM) 5 % [id number]  Place 1 patch onto the skin daily. Remove & Discard patch within 12 hours or as directed by MD Lysbeth Penner, FNP  Active Child  loratadine (CLARITIN) 10 MG tablet [id number]  Take 1 tablet (10 mg total) by mouth daily.  Patient taking differently: Take 10 mg by mouth daily as needed.   Collene Gobble, MD  Active   LORazepam (ATIVAN) 1 MG tablet [id number]  TAKE 1 TABLET EVERY 8 HOURS AS NEEDED FOR ANXIETY. AVOID REGULAR USE. Panosh, Standley Brooking, MD  Active   montelukast (SINGULAIR) 10 MG tablet [id number]  TAKE 1 TABLET ONCE DAILY. Panosh, Standley Brooking, MD  Active   pantoprazole (PROTONIX) 40 MG  tablet [id number]  TAKE (1) TABLET TWICE A DAY BEFORE MEALS. Panosh, Standley Brooking, MD  Active   potassium chloride (KLOR-CON) 10 MEQ tablet [id number]  TAKE FOUR TABLETS BY MOUTH DAILY. Panosh, Standley Brooking, MD  Active   RESTASIS 0.05 % ophthalmic emulsion [id number]  Place 1 drop into both eyes 2 (two) times daily.  [provider]  Active Child           Med Note Gershon Cull Nov 04, 2018 10:27 AM)    Spacer/Aero-Holding Chambers (AEROCHAMBER PLUS) inhaler [id number]  Use as instructed Melynda Ripple, MD  Active Child  vitamin B-12 (CYANOCOBALAMIN) 1000 MCG tablet [id number]  Take 1,000 mcg by mouth daily. [provider]  Active Child  Vitamin D, Cholecalciferol, 25 MCG (1000 UT) TABS [id number]  Take 1,000 Units by mouth daily. [provider]  Active             Patient Active Problem List   Diagnosis Date Noted   Chronic rhinitis 11/24/2019   Nausea 11/03/2018   Elevated troponin 11/03/2018   Hyponatremia 11/03/2018   Hypertensive heart disease with hypertensive chronic kidney disease 11/30/2017   Fasting hyperglycemia 11/30/2017   Renal cyst 03/27/2016   CKD (chronic kidney disease) stage 3, GFR 30-59 ml/min (HCC) 08/23/2015   Perceived hearing changes 06/29/2014   Resistant hypertension 06/29/2014   Lumbago 06/15/2014   Pre-diabetes vs early DM  06/15/2014   Dyspnea 04/27/2014   Asthma, chronic 04/13/2014   Elevated uric acid in blood 04/13/2014   Essential hypertension 03/27/2014   History of DVT (deep vein thrombosis)    Palpitations 01/05/2014   Anemia of chronic disease December 31, 2013   Death of family member 04-Sep-2013   Leg edema 06/20/2013   Bereavement due to life event 04/21/2013   Other dysphagia 02/11/2013   Colon cancer screening 02/11/2013   Anxiety state 01/20/2013   Leg cramps 01/20/2013   Back pain 12/05/2012   Family hx of colon cancer 10/21/2012   Family hx of lung cancer 10/21/2012   Family hx-breast malignancy 10/21/2012    Renal insufficiency creatinine 1.3 10/21/2012   Anemia, chronic disease 10/21/2012   GERD (gastroesophageal reflux disease) 09/08/2012   HTN (hypertension) 09/08/2012   Hyperlipidemia 09/08/2012   Varicose veins of leg with complications 74/94/4967    Immunization History  Administered Date(s) Administered   Fluad Quad(high Dose 65+) 01/17/2019, 02/16/2020   Influenza, High Dose Seasonal PF 02/17/2014, 03/01/2015, 02/21/2016, 03/12/2017, 01/31/2018   Influenza,inj,Quad PF,6+ Mos 01/20/2013   Moderna Sars-Covid-2 Vaccination 04/29/2020   PFIZER(Purple Top)SARS-COV-2 Vaccination 06/07/2019, 06/28/2019   Pneumococcal Conjugate-13 02/21/2016   Pneumococcal Polysaccharide-23 03/10/2020   Pneumococcal-Unspecified 01/13/2009   Td 04/03/2014   Zoster Recombinat (Shingrix) 02/12/2019, 04/18/2019    Conditions to be addressed/monitored:  Hypertension, Hyperlipidemia, GERD, Asthma, Chronic Kidney Disease, Anxiety, Allergic Rhinitis, and Pre-diabetes  Care Plan : [place]  Updates made by Viona Gilmore, [place] since 12/15/2020 12:00 AM     Problem: Problem: Hypertension, Hyperlipidemia, GERD, Asthma, Chronic Kidney Disease, Anxiety, Allergic Rhinitis, and Pre-diabetes      Long-Range Goal: Patient-Specific Goal   Start Date: 11/30/2020  Expected End Date: 11/30/2021  This Visit's Progress: On track  Priority: High  Note:   Current Barriers:  Unable to independently monitor therapeutic efficacy Unable to achieve control of blood pressure   Pharmacist Clinical Goal(s):  Patient will achieve adherence to monitoring guidelines and medication adherence to achieve therapeutic efficacy achieve control of blood pressure as  evidenced by home readings and office readings  through collaboration with PharmD and provider.   Interventions: 1:1 collaboration with Panosh, Standley Brooking, MD regarding development and update of comprehensive plan of care as evidenced by provider attestation  and co-signature Inter-disciplinary care team collaboration (see longitudinal plan of care) Comprehensive medication review performed; medication list updated in electronic medical record  Hypertension  (Status:Goal on track: NO.)   Med Management Intervention:  Recommended medication changes  (BP goal <140/90) -Uncontrolled -Current treatment: Amlodipine 5 mg 1 tablet daily -in AM Doxazosin 2 mg 3 tablets at bedtime - in PM Carvedilol 6.25 mg daily - in AM Hydralazine 50 mg 1.5 tablets three times daily Furosemide 40 mg 2 tablets in AM and 1 tablet in PM -Medications previously tried: unknown  -Current home readings: up to 148-156/56-67 (in AM and PM) - about the same -Current dietary habits: puts a little bit of salt in food - used when cooking  -Current exercise habits: limited -Denies hypotensive/hypertensive symptoms -Educated on BP goals and benefits of medications for prevention of heart attack, stroke and kidney damage; Exercise goal of 150 minutes per week; Importance of home blood pressure monitoring; Proper BP monitoring technique; -Counseled to monitor BP at home twice a week, document, and provide log at future appointments -Counseled on diet and exercise extensively Recommended to continue current medication Recommended switching carvedilol to metoprolol due to asthma.  Hyperlipidemia: (LDL goal < 100) -Not ideally controlled -Current treatment: Atorvastatin 20 mg 1 tablet daily -Medications previously tried: none  -Current dietary patterns: did not discuss -Current exercise habits: limited with SOB -Educated on Cholesterol goals;  -Recommended to continue current medication  Diabetes (A1c goal <6.5%) -Controlled -Current medications: No medications -Medications previously tried: none  -Current home glucose readings fasting glucose: does not need to check post prandial glucose: does not need to check -Denies hypoglycemic/hyperglycemic symptoms -Current  meal patterns:  breakfast: did not discuss  lunch: did not discuss   dinner: did not discuss  snacks: did not discuss  drinks: did not discuss  -Current exercise: limited due to SOB -Educated on A1c and blood sugar goals; Carbohydrate counting and/or plate method -Counseled to check feet daily and get yearly eye exams -Counseled on diet and exercise extensively  Asthma (Goal: control symptoms) -Uncontrolled -Current treatment  Breztri 160-9-4.8 mcg/act 2 puffs twice daily Albuterol HFA as needed Albuterol nebulizer as needed Montelukast 10 mg 1 tablet daily -Medications previously tried: n/a  -Pulmonary function testing: FEV1 66% (11/26/17) -Exacerbations requiring treatment in last 6 months: none -Patient reports consistent use of maintenance inhaler -Frequency of rescue inhaler use: more frequently; every morning -Counseled on Proper inhaler technique; Benefits of consistent maintenance inhaler use When to use rescue inhaler -Recommended to continue current medication  Depression/Anxiety (Goal: minimize symptoms) -Not ideally controlled -Current treatment: Citalopram 10 mg 1 tablet daily Lorazepam 1 mg 1 tablet every 8 hours as needed -Medications previously tried/failed: none -PHQ9: 1 -GAD7: 3 -Educated on Benefits of medication for symptom control -Recommended to continue current medication Counseled on long term risks of taking benzodiazepines and limiting use.  Allergic rhinitis (Goal: minimize symptoms) -Controlled -Current treatment  Loratadine 10 mg 1 tablet daily as needed Fluticasone 50 mcg/act 2 sprays in nostrils daily -Medications previously tried: Dymista  -Recommended to continue current medication  GERD (Goal: minimize symptoms) -Controlled -Current treatment  Pantoprazole 40 mg 1 tablet twice daily before meals -Medications previously tried: none  -Recommended to continue current medication  History of DVT (Goal: prevent  blood  clots) -Controlled -Current treatment  Clopidogrel 75 mg 1 tablet daily -Medications previously tried: none  - Reassess the need at follow up.   Health Maintenance -Vaccine gaps: COVID booster -Current therapy:  Potassium chloride 10 mEq 4 tablets daily  Diclofenac gel 1% as needed Ferrous sulfate 325 mg 1 tablet daily with breakfast Vitamin B12 1000 mcg daily Vitamin D 1000 units daily -Educated on Cost vs benefit of each product must be carefully weighed by individual consumer -Patient is satisfied with current therapy and denies issues -Recommended to continue current medication  Patient Goals/Self-Care Activities Patient will:  - take medications as prescribed check blood pressure twice weekly, document, and provide at future appointments target a minimum of 150 minutes of moderate intensity exercise weekly  Follow Up Plan: Face to Face appointment with care management team member scheduled for:       Medication Assistance: None required.  Patient affirms current coverage meets needs.  Compliance/Adherence/Medication fill history: Care Gaps: DEXA, foot exam, COVID booster  Star-Rating Drugs: Atorvastatin 20 mg - last filled 11/17/20 for 90 ds at [facility]  Patient's preferred pharmacy is:  [place], [place] [address] Phone: [phone] Fax: [phone]  [id number] [place] [id number]: [phone]: [phone] Uses pill box? Yes Pt endorses 95% compliance  We discussed: Benefits of medication synchronization, packaging and delivery as well as enhanced pharmacist oversight with [employer]. Patient decided to: Continue current medication management strategy  Care Plan and Follow Up Patient Decision:  Patient agrees to Care Plan and  Follow-up.  Plan: Face to Face appointment with care management team member scheduled for: 4 months  MJeni Salles PharmD, [place]aybrookPharmacist [phone]

## 2020-12-06 NOTE — Progress Notes (Deleted)
Subjective:   [name] is a 87 y.o. female who presents for Medicare Annual (Subsequent) preventive examination.  Review of Systems    N/a       Objective:    There were no vitals filed for this visit. There is no height or weight on file to calculate BMI.  Advanced Directives 11/03/2018 06/19/2016 08/23/2015 02/22/2015 07/10/2014 06/05/2014 05/25/2014  Does Patient Have a Medical Advance Directive? No No No No No No No  Would patient like information on creating a medical advance directive? No - Patient declined - - Yes - Scientist, clinical (histocompatibility and immunogenetics) given Yes - Scientist, clinical (histocompatibility and immunogenetics) given No - patient declined information Yes Higher education careers adviser given    Current Medications (verified) Outpatient Encounter Medications as of 12/06/2020  Medication Sig   albuterol (PROVENTIL) (2.5 MG/3ML) 0.083% nebulizer solution USE 1 AMPULE EVERY 6 HOURS AS NEEDED FOR WHEEZE   albuterol (VENTOLIN HFA) 108 (90 Base) MCG/ACT inhaler USE 2 PUFFS EVERY 6 HOURS AS NEEDED FOR WHEEZING.   amLODipine (NORVASC) 5 MG tablet TAKE 1 TABLET BY MOUTH DAILY.   atorvastatin (LIPITOR) 20 MG tablet TAKE 1 TABLET ONCE DAILY.   Azelastine-Fluticasone (DYMISTA) 137-50 MCG/ACT SUSP Place 1 spray into the nose in the morning and at bedtime. (Patient not taking: Reported on 09/22/2020)   BREZTRI AEROSPHERE 160-9-4.8 MCG/ACT AERO INHALE 2 PUFFS INTO THE LUNGS TWICE DAILY, IN THE MORNING & AT BEDTIME.   carvedilol (COREG) 6.25 MG tablet Take 6.25 mg by mouth daily.   citalopram (CELEXA) 10 MG tablet TAKE 1 TABLET ONCE DAILY.   clopidogrel (PLAVIX) 75 MG tablet TAKE 1 TABLET ONCE DAILY.   doxazosin (CARDURA) 2 MG tablet TAKE 3 TABLETS AT BEDTIME.   ferrous sulfate 325 (65 FE) MG EC tablet Take 1 tablet (325 mg total) by mouth daily with breakfast.   fluorometholone (FML) 0.1 % ophthalmic suspension Place 1 drop into both eyes 2 (two) times daily.    fluticasone (FLONASE) 50 MCG/ACT nasal spray Place 2 sprays into both  nostrils daily.   furosemide (LASIX) 40 MG tablet Take by mouth. Take 80 mg twice for 2-3 days, then decrease to 80 mg in the am and 40 mg in the pm   hydrALAZINE (APRESOLINE) 50 MG tablet Take 1.5 tablets (75 mg total) by mouth 3 (three) times daily.   HYDROcodone-acetaminophen (NORCO/VICODIN) 5-325 MG tablet Take 1 tablet by mouth daily as needed for moderate pain. (Patient not taking: Reported on 11/30/2020)   loratadine (CLARITIN) 10 MG tablet Take 1 tablet (10 mg total) by mouth daily. (Patient taking differently: Take 10 mg by mouth daily as needed.)   LORazepam (ATIVAN) 1 MG tablet TAKE 1 TABLET EVERY 8 HOURS AS NEEDED FOR ANXIETY. AVOID REGULAR USE. (Patient not taking: Reported on 11/30/2020)   montelukast (SINGULAIR) 10 MG tablet TAKE 1 TABLET ONCE DAILY.   pantoprazole (PROTONIX) 40 MG tablet TAKE (1) TABLET TWICE A DAY BEFORE MEALS.   potassium chloride (KLOR-CON) 10 MEQ tablet TAKE FOUR TABLETS BY MOUTH DAILY.   prednisoLONE acetate (PRED FORTE) 1 % ophthalmic suspension 1 drop 2 (two) times daily.   Spacer/Aero-Holding Chambers (AEROCHAMBER PLUS) inhaler Use as instructed   vitamin B-12 (CYANOCOBALAMIN) 1000 MCG tablet Take 1,000 mcg by mouth daily.   Vitamin D, Cholecalciferol, 25 MCG (1000 UT) TABS Take 1,000 Units by mouth daily.   No facility-administered encounter medications on file as of 12/06/2020.    Allergies (verified) Penicillins and Aspirin   History: Past Medical History:  Diagnosis  Date   Acute superficial venous thrombosis of left lower extremity 03/27/2014   concern about hx and potential extensive on exam tenderness calf and low dose lovenox 40 qd 2-4 weekselevetion and close  fu advised .    Allergy    Anemia    Anxiety    Arthritis    "shoulders" (09/09/2012)   Asthma 09/08/2012   Carotid bruit    [place] 2018  low risk 1 - 39%   Diabetes mellitus without complication ([place])    "Borderline" per pt; being monitored.  no meds per pt   Dyspnea 04/27/2014   GERD  (gastroesophageal reflux disease)    Headache(784.0)    "related to my high blood pressure" (09/09/2012)   History of DVT (deep vein thrombosis)    "RLE" (09/09/2012) coumadine cant take asa so on plavix    HTN (hypertension) 09/08/2012   Hyperlipidemia    Lupus ([place])    "cured years ago" (09/09/2012)   Other dysphagia 02/11/2013   Syncope and collapse 01/21/2018   Varicose veins    Varicose veins of leg with complications [id number]   Past Surgical History:  Procedure Laterality Date   ABDOMINAL HYSTERECTOMY     partial   BREAST EXCISIONAL BIOPSY Right    BREAST SURGERY     CARPAL TUNNEL RELEASE Right 2000's   CARPAL TUNNEL RELEASE Bilateral 10/21/2013   Procedure: BILATERAL  CARPAL TUNNEL RELEASE;  Surgeon: [name], MD;  Location: [facility];  Service: Orthopedics;  Laterality: Bilateral;   SHOULDER OPEN ROTATOR CUFF REPAIR Bilateral 2000's   TRIGGER FINGER RELEASE Right 10/21/2013   Procedure: RELEASE TRIGGER FINGER/A-1 PULLEY RIGHT MIDDLE AND RIGHT RING;  Surgeon: [name], MD;  Location: [facility];  Service: Orthopedics;  Laterality: Right;   Family History  Problem Relation Age of Onset   Hypertension Mother    Breast cancer Mother    Hypertension Father    Cancer Sister        Colon and Breast Cancer   Breast cancer Sister    Colon cancer Sister        ? 33' s dx - died in 45's   Hypertension Brother    Rectal cancer Brother    Stomach cancer Brother    Breast cancer Brother    Heart attack Daughter    Esophageal cancer Daughter    Lung cancer Other        both parents    Breast cancer Paternal Aunt    Social History   Socioeconomic History   Marital status: Single    Spouse name: Not on file   Number of children: 6   Years of education: Not on file   Highest education level: Not on file  Occupational History   Not on file  Tobacco Use   Smoking status: Never   Smokeless tobacco: Never  Vaping Use   Vaping Use: Never used   Substance and Sexual Activity   Alcohol use: No   Drug use: No   Sexual activity: Not Currently    Birth control/protection: Surgical  Other Topics Concern   Not on file  Social History Narrative   2 people living in the home.  Grand daughter.   Up and down through the night      Had 7 children  2 deceased . 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  works 3 days per week currently .   Glasses dentures  Neg tad    Social Determinants of Health   Financial Resource Strain: Low Risk    Difficulty of Paying Living Expenses: Not hard at all  Food Insecurity: Not on file  Transportation Needs: No Transportation Needs   Lack of Transportation (Medical): No   Lack of Transportation (Non-Medical): No  Physical Activity: Not on file  Stress: Not on file  Social Connections: Not on file    Tobacco Counseling Counseling given: Not Answered   Clinical Intake:                 Diabetic?***         Activities of Daily Living In your present state of health, do you have any difficulty performing the following activities: 03/10/2020  Hearing? N  Vision? N  Difficulty concentrating or making decisions? N  Walking or climbing stairs? N  Dressing or bathing? N  Doing errands, shopping? N  Some recent data might be hidden    Patient Care Team: Panosh, Standley Brooking, MD as PCP - General (Internal Medicine) Nahser, Wonda Cheng, MD as PCP - Cardiology (Cardiology) Marlou Sa, Tonna Corner, MD (Orthopedic Surgery) Truitt Merle, MD as Consulting Physician (Hematology) Madelon Lips, MD as Consulting Physician (Nephrology) Monna Fam, MD as Consulting Physician (Ophthalmology) Germaine Pomfret, [facility] as Pharmacist (Pharmacist)  Indicate any recent Medical Services you may have received from other than [facility] providers in the past year (date may be approximate).     Assessment:   This is a routine  wellness examination for Laquandra.  Hearing/Vision screen No results found.  Dietary issues and exercise activities discussed:     Goals Addressed   None    Depression Screen PHQ 2/9 Scores 07/21/2020 05/21/2019 03/25/2018 10/06/2016 07/10/2014 11/24/2013  PHQ - 2 Score 0 0 0 0 1 0  PHQ- 9 Score 1 - - - - -    Fall Risk Fall Risk  03/10/2020 03/25/2018 10/06/2016 07/10/2014 11/24/2013  Falls in the past year? 0 0 No No No  Number falls in past yr: 0 0 - - -  Injury with Fall? 0 0 - - -    FALL RISK PREVENTION PERTAINING TO THE HOME:  Any stairs in or around the home? {YES/NO:21197} If so, are there any without handrails? {YES/NO:21197} Home free of loose throw rugs in walkways, pet beds, electrical cords, etc? {YES/NO:21197} Adequate lighting in your home to reduce risk of falls? {YES/NO:21197}  ASSISTIVE DEVICES UTILIZED TO PREVENT FALLS:  Life alert? {YES/NO:21197} Use of a cane, walker or w/c? {YES/NO:21197} Grab bars in the bathroom? {YES/NO:21197} Shower chair or bench in shower? {YES/NO:21197} Elevated toilet seat or a handicapped toilet? {YES/NO:21197}  TIMED UP AND GO:  Was the test performed? {YES/NO:21197}.  Length of time to ambulate 10 feet: *** sec.   {Appearance of YN:[id number]  Cognitive Function:        Immunizations Immunization History  Administered Date(s) Administered   Fluad Quad(high Dose 65+) 01/17/2019, 02/16/2020   Influenza, High Dose Seasonal PF 02/17/2014, 03/01/2015, 02/21/2016, 03/12/2017, 01/31/2018   Influenza,inj,Quad PF,6+ Mos 01/20/2013   Moderna Sars-Covid-2 Vaccination 04/29/2020   PFIZER(Purple Top)SARS-COV-2 Vaccination 06/07/2019, 06/28/2019   Pneumococcal Conjugate-13 02/21/2016   Pneumococcal Polysaccharide-23 03/10/2020   Pneumococcal-Unspecified 01/13/2009   Td 04/03/2014   Zoster Recombinat (Shingrix) 02/12/2019, 04/18/2019    {TDAP status:2101805}  {Flu Vaccine status:2101806}  {Pneumococcal vaccine  status:2101807}  {Covid-19 vaccine status:2101808}  Qualifies for Shingles Vaccine? {YES/NO:21197}  Zostavax completed {YES/NO:21197}  {Shingrix Completed?:2101804}  Screening Tests Health Maintenance  Topic Date Due   FOOT EXAM  Never done   DEXA SCAN  Never done   COVID-19 Vaccine (4 - Booster) 07/28/2020   INFLUENZA VACCINE  12/13/2020   HEMOGLOBIN A1C  01/21/2021   OPHTHALMOLOGY EXAM  06/10/2021   TETANUS/TDAP  04/03/2024   PNA vac Low Risk Adult  Completed   Zoster Vaccines- Shingrix  Completed   HPV VACCINES  Aged Out    Health Maintenance  Health Maintenance Due  Topic Date Due   FOOT EXAM  Never done   DEXA SCAN  Never done   COVID-19 Vaccine (4 - Booster) 07/28/2020    {Colorectal cancer screening:2101809}  {Mammogram status:21018020}  {Bone Density status:21018021}  Lung Cancer Screening: (Low Dose CT Chest recommended if Age 58-80 years, 30 pack-year currently smoking OR have quit w/in 15years.) {DOES NOT does:27190::"does not"} qualify.   Lung Cancer Screening Referral: ***  Additional Screening:  Hepatitis C Screening: {DOES NOT does:27190::"does not"} qualify; Completed ***  Vision Screening: Recommended annual ophthalmology exams for early detection of glaucoma and other disorders of the eye. Is the patient up to date with their annual eye exam?  {YES/NO:21197} Who is the provider or what is the name of the office in which the patient attends annual eye exams? *** If pt is not established with a provider, would they like to be referred to a provider to establish care? {YES/NO:21197}.   Dental Screening: Recommended annual dental exams for proper oral hygiene  Community Resource Referral / Chronic Care Management: CRR required this visit?  {YES/NO:21197}  CCM required this visit?  {YES/NO:21197}     Plan:     I have personally reviewed and noted the following in the patient's chart:   Medical and social history Use of alcohol, tobacco or  illicit drugs  Current medications and supplements including opioid prescriptions.  Functional ability and status Nutritional status Physical activity Advanced directives List of other physicians Hospitalizations, surgeries, and ER visits in previous 12 months Vitals Screenings to include cognitive, depression, and falls Referrals and appointments  In addition, I have reviewed and discussed with patient certain preventive protocols, quality metrics, and best practice recommendations. A written personalized care plan for preventive services as well as general preventive health recommendations were provided to patient.     Randel Pigg, LPN   [id number]   Nurse Notes: ***

## 2020-12-15 ENCOUNTER — Other Ambulatory Visit: Payer: Self-pay | Admitting: Emergency Medicine

## 2020-12-21 ENCOUNTER — Other Ambulatory Visit: Payer: Self-pay

## 2020-12-21 ENCOUNTER — Ambulatory Visit
Admission: RE | Admit: 2020-12-21 | Discharge: 2020-12-21 | Disposition: A | Discharge status: Home / Self Care | Payer: Medicare Other | Source: Ambulatory Visit | Attending: Internal Medicine | Admitting: Internal Medicine

## 2020-12-21 DIAGNOSIS — Z1231 Encounter for screening mammogram for malignant neoplasm of breast: Secondary | ICD-10-CM | POA: Diagnosis not present

## 2020-12-27 ENCOUNTER — Telehealth: Payer: Self-pay | Admitting: Internal Medicine

## 2020-12-27 ENCOUNTER — Ambulatory Visit: Payer: Medicare Other

## 2020-12-27 NOTE — Telephone Encounter (Signed)
Left message asking pt to call  I gave both office number and my jabber number [phone]   Please r/s awv appointment laura will be out of office

## 2020-12-27 NOTE — Telephone Encounter (Signed)
Tried calling patient again.

## 2020-12-28 ENCOUNTER — Ambulatory Visit: Payer: Medicare Other

## 2021-01-03 ENCOUNTER — Other Ambulatory Visit: Payer: Self-pay | Admitting: Internal Medicine

## 2021-01-07 ENCOUNTER — Telehealth: Payer: Self-pay | Admitting: Internal Medicine

## 2021-01-07 NOTE — Telephone Encounter (Signed)
Left message for patient to call back and schedule Medicare Annual Wellness Visit (AWV) either virtually or in office. Left  my jabber number [phone] ° ° °AWV-I per [place] 05/15/09 °please schedule at anytime with [facility] Nurse Health Advisor 1 or 2 ° ° °This should be a 45 minute visit.  °

## 2021-01-12 ENCOUNTER — Ambulatory Visit: Payer: Medicare Other | Admitting: Emergency Medicine

## 2021-01-12 ENCOUNTER — Other Ambulatory Visit: Payer: Self-pay

## 2021-01-12 ENCOUNTER — Encounter: Payer: Self-pay | Admitting: Emergency Medicine

## 2021-01-12 DIAGNOSIS — K219 Gastro-esophageal reflux disease without esophagitis: Secondary | ICD-10-CM | POA: Diagnosis not present

## 2021-01-12 DIAGNOSIS — J310 Chronic rhinitis: Secondary | ICD-10-CM

## 2021-01-12 DIAGNOSIS — J45909 Unspecified asthma, uncomplicated: Secondary | ICD-10-CM

## 2021-01-12 MED ORDER — AZELASTINE-FLUTICASONE 137-50 MCG/ACT NA SUSP: 1.0000 | Freq: Two times a day (BID) | NASAL | 6 refills | Status: DC

## 2021-01-12 NOTE — Assessment & Plan Note (Signed)
Continue your Dymista nasal spray, 2 sprays each nostril twice a day.  We will refill this for you today.

## 2021-01-12 NOTE — Progress Notes (Signed)
   Subjective:    Patient ID: [name], female    DOB: 03-May-1934, 87 y.o.   MRN: [id number]  HPI  [name] 11/24/19 --87 year old never smoker with diabetes, history of right lower extremity DVT, hypertension, questionable lupus.  Has been followed in our office for moderate persistent asthma and secondary pulmonary hypertension.  PFT 2019 with moderately severe obstruction as above.  I saw her about 1 month ago and she was having persistent shortness of breath, upper airway noise.  I changed her Advair to [facility] in order to avoid powdered formulation.  Added fluticasone nasal spray and loratadine, continue Singulair and pantoprazole.  She returns today reporting that her breathing is a bit better. Still some SOB w stairs but not needing any albuterol at those times.  She is still having hoarseness, globus sensation and a lot of throat clearing. She has albuterol, uses a neb most mornings, and HFA in the evening. She does still have occasional breakthrough GERD sx.,   [name] 01/12/21 --follow-up visit for 87 year old woman with history of diabetes, lower extremity DVT, hypertension, question SLE, moderate persistent asthma with secondary pulmonary hypertension.  She has allergic rhinitis and GERD that do exacerbate cough and her asthma.  Currently managed on Breztri.  She has albuterol, uses nebs about once a week. Uses HFA about daily.  On Protonix twice daily, Singulair, Dymista started last time, ran out 1 week ago, does miss it.  Loratadine as needed, not recently.  She was treated with Pred, unsure whether it was in the last month.  She is able to walk on flat ground, sometimes SOB with longer distances and stairs.   Review of Systems As per Hpi     Objective:   Physical Exam Vitals:   01/12/21 1146  BP: (!) 156/68  Pulse: 66  Temp: (!) 97.5 F (36.4 C)  TempSrc: Oral  SpO2: 98%  Weight: 161 lb 12.8 oz (73.4 kg)  Height: 5' (1.524 m)   Gen: Pleasant, overweight, in no distress,   normal affect  ENT: No lesions,  mouth clear,  oropharynx clear, no postnasal drip  Neck: No JVD, stronger voice.  No real upper airway noise (improved)  Lungs: No use of accessory muscles, no crackles or wheezing on normal respiration  Cardiovascular: RRR, heart sounds normal, no murmur or gallops, no peripheral edema  Musculoskeletal: No deformities, no cyanosis or clubbing  Neuro: alert, awake, non focal  Skin: Warm, no lesions or rash       Assessment & Plan:  Asthma, chronic With associated upper airway instability.  Both are better.  She does intermittently use albuterol especially with exertion.  Does believe she is benefiting from her respiratory.  I will plan to continue.  Please continue Breztri 2 puffs twice a day.  Rinse and gargle after using. Keep your albuterol available to use either 1 nebulizer treatment or 2 puffs up to every 4 hours if needed for shortness of breath, chest tightness, wheezing. Continue your Singulair 10 mg each evening. Follow with Dr. Lamonte Sakai in 12 months or sooner if you have any problems.   GERD (gastroesophageal reflux disease) Please continue your Protonix 40 mg twice a day.  Take this medication 1 hour around food.  Chronic rhinitis Continue your Dymista nasal spray, 2 sprays each nostril twice a day.  We will refill this for you today.  Baltazar Apo, MD, PhD 01/12/2021, 12:09 PM [facility] [phone] or if no answer [phone]

## 2021-01-12 NOTE — Patient Instructions (Signed)
Please continue Breztri 2 puffs twice a day.  Rinse and gargle after using. Keep your albuterol available to use either 1 nebulizer treatment or 2 puffs up to every 4 hours if needed for shortness of breath, chest tightness, wheezing. Continue your Singulair 10 mg each evening. Continue your Dymista nasal spray, 2 sprays each nostril twice a day.  We will refill this for you today. Please continue your Protonix 40 mg twice a day.  Take this medication 1 hour around food. Follow with Dr. Lamonte Sakai in 12 months or sooner if you have any problems.

## 2021-01-12 NOTE — Assessment & Plan Note (Signed)
With associated upper airway instability.  Both are better.  She does intermittently use albuterol especially with exertion.  Does believe she is benefiting from her respiratory.  I will plan to continue.  Please continue Breztri 2 puffs twice a day.  Rinse and gargle after using. Keep your albuterol available to use either 1 nebulizer treatment or 2 puffs up to every 4 hours if needed for shortness of breath, chest tightness, wheezing. Continue your Singulair 10 mg each evening. Follow with Dr. Lamonte Sakai in 12 months or sooner if you have any problems.

## 2021-01-12 NOTE — Addendum Note (Signed)
Addended by: Gavin Potters R on: 01/12/2021 12:33 PM   Modules accepted: Orders

## 2021-01-12 NOTE — Assessment & Plan Note (Signed)
Please continue your Protonix 40 mg twice a day.  Take this medication 1 hour around food.

## 2021-01-14 DIAGNOSIS — J4541 Moderate persistent asthma with (acute) exacerbation: Secondary | ICD-10-CM | POA: Diagnosis not present

## 2021-01-24 ENCOUNTER — Ambulatory Visit: Payer: Medicare Other | Admitting: Internal Medicine

## 2021-01-24 DIAGNOSIS — H4710 Unspecified papilledema: Secondary | ICD-10-CM | POA: Diagnosis not present

## 2021-01-24 DIAGNOSIS — H2000 Unspecified acute and subacute iridocyclitis: Secondary | ICD-10-CM | POA: Diagnosis not present

## 2021-01-24 DIAGNOSIS — H1131 Conjunctival hemorrhage, right eye: Secondary | ICD-10-CM | POA: Diagnosis not present

## 2021-01-24 NOTE — Progress Notes (Deleted)
No chief complaint on file.   HPI: [name] 87 y.o. come in for Chronic disease management   HBP Renal insufficiency RESP asthma and PHT singuliar  brextri Rhinitis  dymista  Anxiety  Atorva Plavix  PPI ROS: See pertinent positives and negatives per HPI.  Past Medical History:  Diagnosis Date   Acute superficial venous thrombosis of left lower extremity 03/27/2014   concern about hx and potential extensive on exam tenderness calf and low dose lovenox 40 qd 2-4 weekselevetion and close  fu advised .    Allergy    Anemia    Anxiety    Arthritis    "shoulders" (09/09/2012)   Asthma 09/08/2012   Carotid bruit    [place] 2018  low risk 1 - 39%   Diabetes mellitus without complication ([place])    "Borderline" per pt; being monitored.  no meds per pt   Dyspnea 04/27/2014   GERD (gastroesophageal reflux disease)    Headache(784.0)    "related to my high blood pressure" (09/09/2012)   History of DVT (deep vein thrombosis)    "RLE" (09/09/2012) coumadine cant take asa so on plavix    HTN (hypertension) 09/08/2012   Hyperlipidemia    Lupus ([place])    "cured years ago" (09/09/2012)   Other dysphagia 02/11/2013   Syncope and collapse 01/21/2018   Varicose veins    Varicose veins of leg with complications [id number]    Family History  Problem Relation Age of Onset   Hypertension Mother    Breast cancer Mother    Hypertension Father    Cancer Sister        Colon and Breast Cancer   Breast cancer Sister    Colon cancer Sister        ? 37' s dx - died in 76's   Hypertension Brother    Rectal cancer Brother    Stomach cancer Brother    Breast cancer Brother    Heart attack Daughter    Esophageal cancer Daughter    Lung cancer Other        both parents    Breast cancer Paternal Aunt     Social History   Socioeconomic History   Marital status: Single    Spouse name: Not on file   Number of children: 6   Years of education: Not on file   Highest education level: Not on  file  Occupational History   Not on file  Tobacco Use   Smoking status: Never   Smokeless tobacco: Never  Vaping Use   Vaping Use: Never used  Substance and Sexual Activity   Alcohol use: No   Drug use: No   Sexual activity: Not Currently    Birth control/protection: Surgical  Other Topics Concern   Not on file  Social History Narrative   2 people living in the home.  Grand daughter.   Up and down through the night      Had 7 children  2 deceased . 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    works 3 days per week currently .   Glasses dentures  Neg tad    Social Determinants of Health   Financial Resource Strain: Not on file  Food Insecurity: Not on file  Transportation Needs: Not on file  Physical Activity: Not on file  Stress: Not on file  Social Connections: Not on file  Outpatient Medications Prior to Visit  Medication Sig Dispense Refill   albuterol (PROVENTIL) (2.5 MG/3ML) 0.083% nebulizer solution USE 1 AMPULE EVERY 6 HOURS AS NEEDED FOR WHEEZE 75 mL 1   albuterol (VENTOLIN HFA) 108 (90 Base) MCG/ACT inhaler USE 2 PUFFS EVERY 6 HOURS AS NEEDED FOR WHEEZING. 8.5 g 3   amLODipine (NORVASC) 5 MG tablet TAKE 1 TABLET BY MOUTH DAILY. 90 tablet 3   atorvastatin (LIPITOR) 20 MG tablet TAKE 1 TABLET ONCE DAILY. 90 tablet 1   Azelastine-Fluticasone (DYMISTA) 137-50 MCG/ACT SUSP Place 1 spray into the nose in the morning and at bedtime. 23 g 6   BREZTRI AEROSPHERE 160-9-4.8 MCG/ACT AERO INHALE 2 PUFFS INTO THE LUNGS TWICE DAILY, IN THE MORNING & AT BEDTIME. 10.7 g 1   carvedilol (COREG) 6.25 MG tablet Take 6.25 mg by mouth daily.     citalopram (CELEXA) 10 MG tablet TAKE 1 TABLET ONCE DAILY. 90 tablet 1   clopidogrel (PLAVIX) 75 MG tablet TAKE 1 TABLET ONCE DAILY. 30 tablet 1   doxazosin (CARDURA) 2 MG tablet TAKE THREE TABLETS BY MOUTH AT BEDTIME 270 tablet 0   ferrous sulfate 325 (65 FE) MG EC  tablet Take 1 tablet (325 mg total) by mouth daily with breakfast. 30 tablet 3   fluorometholone (FML) 0.1 % ophthalmic suspension Place 1 drop into both eyes 2 (two) times daily.      fluticasone (FLONASE) 50 MCG/ACT nasal spray Place 2 sprays into both nostrils daily. (Patient not taking: Reported on 01/12/2021) 16 g 2   furosemide (LASIX) 40 MG tablet Take by mouth. Take 80 mg twice for 2-3 days, then decrease to 80 mg in the am and 40 mg in the pm     hydrALAZINE (APRESOLINE) 50 MG tablet Take 1.5 tablets (75 mg total) by mouth 3 (three) times daily. 405 tablet 3   HYDROcodone-acetaminophen (NORCO/VICODIN) 5-325 MG tablet Take 1 tablet by mouth daily as needed for moderate pain. 15 tablet 0   loratadine (CLARITIN) 10 MG tablet Take 1 tablet (10 mg total) by mouth daily. (Patient taking differently: Take 10 mg by mouth daily as needed.) 30 tablet 11   LORazepam (ATIVAN) 1 MG tablet TAKE 1 TABLET EVERY 8 HOURS AS NEEDED FOR ANXIETY. AVOID REGULAR USE. 24 tablet 0   montelukast (SINGULAIR) 10 MG tablet TAKE 1 TABLET ONCE DAILY. 90 tablet 1   pantoprazole (PROTONIX) 40 MG tablet TAKE (1) TABLET TWICE A DAY BEFORE MEALS. 180 tablet 1   potassium chloride (KLOR-CON) 10 MEQ tablet TAKE FOUR TABLETS BY MOUTH DAILY. 120 tablet 3   prednisoLONE acetate (PRED FORTE) 1 % ophthalmic suspension 1 drop 2 (two) times daily.     Spacer/Aero-Holding Chambers (AEROCHAMBER PLUS) inhaler Use as instructed 1 each 2   vitamin B-12 (CYANOCOBALAMIN) 1000 MCG tablet Take 1,000 mcg by mouth daily.     Vitamin D, Cholecalciferol, 25 MCG (1000 UT) TABS Take 1,000 Units by mouth daily.     No facility-administered medications prior to visit.     EXAM:  There were no vitals taken for this visit.  There is no height or weight on file to calculate BMI.  GENERAL: vitals reviewed and listed above, alert, oriented, appears well hydrated and in no acute distress HEENT: atraumatic, conjunctiva  clear, no obvious  abnormalities on inspection of external nose and ears OP : no lesion edema or exudate  NECK: no obvious masses on inspection palpation  LUNGS: clear to auscultation bilaterally, no wheezes, rales or  rhonchi, good air movement CV: HRRR, no clubbing cyanosis or  peripheral edema nl cap refill  MS: moves all extremities without noticeable focal  abnormality PSYCH: pleasant and cooperative, no obvious depression or anxiety Lab Results  Component Value Date   WBC 4.7 07/01/2020   HGB 10.0 (A) 07/01/2020   HCT 31 (A) 07/01/2020   PLT 178 07/01/2020   GLUCOSE 111 (H) 07/21/2020   CHOL 198 07/21/2020   TRIG 132.0 07/21/2020   HDL 66.40 07/21/2020   LDLCALC 106 (H) 07/21/2020   ALT 6 07/21/2020   AST 16 07/21/2020   NA 142 11/04/2020   K 3.7 11/04/2020   CL 106 11/04/2020   CREATININE 2.4 (A) 11/04/2020   BUN 43 (A) 11/04/2020   CO2 27 (A) 11/04/2020   TSH 2.34 09/19/2019   INR 0.94 12/22/2009   HGBA1C 6.0 07/21/2020   MICROALBUR 103.8 11/04/2020   BP Readings from Last 3 Encounters:  01/12/21 (!) 156/68  09/22/20 (!) 162/70  07/21/20 (!) 142/62    ASSESSMENT AND PLAN:  Discussed the following assessment and plan:  No diagnosis found. ? Flu vaccine? -Patient advised to return or notify health care team  if  new concerns arise.  There are no Patient Instructions on file for this visit.   Standley Brooking. Alper Guilmette M.D.

## 2021-01-27 ENCOUNTER — Ambulatory Visit (INDEPENDENT_AMBULATORY_CARE_PROVIDER_SITE_OTHER): Payer: Medicare Other | Admitting: Internal Medicine

## 2021-01-27 ENCOUNTER — Encounter: Payer: Self-pay | Admitting: Internal Medicine

## 2021-01-27 ENCOUNTER — Other Ambulatory Visit: Payer: Self-pay

## 2021-01-27 VITALS — BP 170/60 | HR 56 | Temp 98.2°F | Ht 60.0 in | Wt 162.4 lb

## 2021-01-27 DIAGNOSIS — M67432 Ganglion, left wrist: Secondary | ICD-10-CM

## 2021-01-27 DIAGNOSIS — D638 Anemia in other chronic diseases classified elsewhere: Secondary | ICD-10-CM | POA: Diagnosis not present

## 2021-01-27 DIAGNOSIS — M25511 Pain in right shoulder: Secondary | ICD-10-CM

## 2021-01-27 DIAGNOSIS — D619 Aplastic anemia, unspecified: Secondary | ICD-10-CM | POA: Diagnosis not present

## 2021-01-27 DIAGNOSIS — Z23 Encounter for immunization: Secondary | ICD-10-CM

## 2021-01-27 DIAGNOSIS — D649 Anemia, unspecified: Secondary | ICD-10-CM

## 2021-01-27 DIAGNOSIS — Z79899 Other long term (current) drug therapy: Secondary | ICD-10-CM | POA: Diagnosis not present

## 2021-01-27 DIAGNOSIS — R7301 Impaired fasting glucose: Secondary | ICD-10-CM

## 2021-01-27 DIAGNOSIS — N184 Chronic kidney disease, stage 4 (severe): Secondary | ICD-10-CM

## 2021-01-27 LAB — IBC PANEL
Iron: 69 ug/dL (ref 42–145)
Saturation Ratios: 31.4 % (ref 20.0–50.0)
TIBC: 219.8 ug/dL — ABNORMAL LOW (ref 250.0–450.0)
Transferrin: 157 mg/dL — ABNORMAL LOW (ref 212.0–360.0)

## 2021-01-27 LAB — CBC WITH DIFFERENTIAL/PLATELET
Basophils Absolute: 0 10*3/uL (ref 0.0–0.1)
Basophils Relative: 0.6 % (ref 0.0–3.0)
Eosinophils Absolute: 0.1 10*3/uL (ref 0.0–0.7)
Eosinophils Relative: 1.5 % (ref 0.0–5.0)
HCT: 27.9 % — ABNORMAL LOW (ref 36.0–46.0)
Hemoglobin: 9.1 g/dL — ABNORMAL LOW (ref 12.0–15.0)
Lymphocytes Relative: 26.2 % (ref 12.0–46.0)
Lymphs Abs: 1.2 10*3/uL (ref 0.7–4.0)
MCHC: 32.6 g/dL (ref 30.0–36.0)
MCV: 88.9 fl (ref 78.0–100.0)
Monocytes Absolute: 0.3 10*3/uL (ref 0.1–1.0)
Monocytes Relative: 7.4 % (ref 3.0–12.0)
Neutro Abs: 2.8 10*3/uL (ref 1.4–7.7)
Neutrophils Relative %: 64.3 % (ref 43.0–77.0)
Platelets: 170 10*3/uL (ref 150.0–400.0)
RBC: 3.13 Mil/uL — ABNORMAL LOW (ref 3.87–5.11)
RDW: 13 % (ref 11.5–15.5)
WBC: 4.4 10*3/uL (ref 4.0–10.5)

## 2021-01-27 LAB — HEMOGLOBIN A1C: Hgb A1c MFr Bld: 5.7 % (ref 4.6–6.5)

## 2021-01-27 LAB — BASIC METABOLIC PANEL
BUN: 40 mg/dL — ABNORMAL HIGH (ref 6–23)
CO2: 27 mEq/L (ref 19–32)
Calcium: 9.3 mg/dL (ref 8.4–10.5)
Chloride: 105 mEq/L (ref 96–112)
Creatinine, Ser: 2.64 mg/dL — ABNORMAL HIGH (ref 0.40–1.20)
GFR: 16.08 mL/min — ABNORMAL LOW (ref >60.00)
Glucose, Bld: 108 mg/dL — ABNORMAL HIGH (ref 70–99)
Potassium: 3.6 mEq/L (ref 3.5–5.1)
Sodium: 143 mEq/L (ref 135–145)

## 2021-01-27 MED ORDER — DICLOFENAC SODIUM 1 % EX GEL: 4.0000 g | Freq: Four times a day (QID) | CUTANEOUS | 6 refills | Status: DC

## 2021-01-27 NOTE — Progress Notes (Signed)
Chief Complaint  Patient presents with   Follow-up    HPI: [name] 87 y.o. come in for Chronic disease management  Respiratory no change has seen pulmonary Needs follow-up blood work for her anemia and her blood sugar She now has a new problem a lump on the dorsal area of her left wrist she is left-handed it is somewhat tender but no numbness it is been there for weeks. Right shoulder is bothersome with arm is hard to lift now no injury. Blood pressures been up some cardiology has adjusted her hydralazine up to 75 mg dosing Flu shot. ROS: See pertinent positives and negatives per HPI.  Past Medical History:  Diagnosis Date   Acute superficial venous thrombosis of left lower extremity 03/27/2014   concern about hx and potential extensive on exam tenderness calf and low dose lovenox 40 qd 2-4 weekselevetion and close  fu advised .    Allergy    Anemia    Anxiety    Arthritis    "shoulders" (09/09/2012)   Asthma 09/08/2012   Carotid bruit    [place] 2018  low risk 1 - 39%   Diabetes mellitus without complication ([place])    "Borderline" per pt; being monitored.  no meds per pt   Dyspnea 04/27/2014   GERD (gastroesophageal reflux disease)    Headache(784.0)    "related to my high blood pressure" (09/09/2012)   History of DVT (deep vein thrombosis)    "RLE" (09/09/2012) coumadine cant take asa so on plavix    HTN (hypertension) 09/08/2012   Hyperlipidemia    Lupus ([place])    "cured years ago" (09/09/2012)   Other dysphagia 02/11/2013   Syncope and collapse 01/21/2018   Varicose veins    Varicose veins of leg with complications [id number]    Family History  Problem Relation Age of Onset   Hypertension Mother    Breast cancer Mother    Hypertension Father    Cancer Sister        Colon and Breast Cancer   Breast cancer Sister    Colon cancer Sister        ? 65' s dx - died in 68's   Hypertension Brother    Rectal cancer Brother    Stomach cancer Brother    Breast cancer  Brother    Heart attack Daughter    Esophageal cancer Daughter    Lung cancer Other        both parents    Breast cancer Paternal Aunt     Social History   Socioeconomic History   Marital status: Single    Spouse name: Not on file   Number of children: 6   Years of education: Not on file   Highest education level: Not on file  Occupational History   Not on file  Tobacco Use   Smoking status: Never   Smokeless tobacco: Never  Vaping Use   Vaping Use: Never used  Substance and Sexual Activity   Alcohol use: No   Drug use: No   Sexual activity: Not Currently    Birth control/protection: Surgical  Other Topics Concern   Not on file  Social History Narrative   2 people living in the home.  Grand daughter.   Up and down through the night      Had 7 children  2 deceased . 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    works 3 days per week currently .   Glasses dentures  Neg tad    Social Determinants of Health   Financial Resource Strain: Not on file  Food Insecurity: Not on file  Transportation Needs: Not on file  Physical Activity: Not on file  Stress: Not on file  Social Connections: Not on file    Outpatient Medications Prior to Visit  Medication Sig Dispense Refill   albuterol (PROVENTIL) (2.5 MG/3ML) 0.083% nebulizer solution USE 1 AMPULE EVERY 6 HOURS AS NEEDED FOR WHEEZE 75 mL 1   albuterol (VENTOLIN HFA) 108 (90 Base) MCG/ACT inhaler USE 2 PUFFS EVERY 6 HOURS AS NEEDED FOR WHEEZING. 8.5 g 3   amLODipine (NORVASC) 5 MG tablet TAKE 1 TABLET BY MOUTH DAILY. 90 tablet 3   atorvastatin (LIPITOR) 20 MG tablet TAKE 1 TABLET ONCE DAILY. 90 tablet 1   Azelastine-Fluticasone (DYMISTA) 137-50 MCG/ACT SUSP Place 1 spray into the nose in the morning and at bedtime. 23 g 6   BREZTRI AEROSPHERE 160-9-4.8 MCG/ACT AERO INHALE 2 PUFFS INTO THE LUNGS TWICE DAILY, IN THE MORNING & AT BEDTIME. 10.7 g 1    carvedilol (COREG) 6.25 MG tablet Take 6.25 mg by mouth daily.     citalopram (CELEXA) 10 MG tablet TAKE 1 TABLET ONCE DAILY. 90 tablet 1   clopidogrel (PLAVIX) 75 MG tablet TAKE 1 TABLET ONCE DAILY. 30 tablet 1   doxazosin (CARDURA) 2 MG tablet TAKE THREE TABLETS BY MOUTH AT BEDTIME 270 tablet 0   ferrous sulfate 325 (65 FE) MG EC tablet Take 1 tablet (325 mg total) by mouth daily with breakfast. 30 tablet 3   fluorometholone (FML) 0.1 % ophthalmic suspension Place 1 drop into both eyes 2 (two) times daily.      fluticasone (FLONASE) 50 MCG/ACT nasal spray Place 2 sprays into both nostrils daily. 16 g 2   furosemide (LASIX) 40 MG tablet Take by mouth. Take 80 mg twice for 2-3 days, then decrease to 80 mg in the am and 40 mg in the pm     hydrALAZINE (APRESOLINE) 50 MG tablet Take 1.5 tablets (75 mg total) by mouth 3 (three) times daily. 405 tablet 3   HYDROcodone-acetaminophen (NORCO/VICODIN) 5-325 MG tablet Take 1 tablet by mouth daily as needed for moderate pain. 15 tablet 0   loratadine (CLARITIN) 10 MG tablet Take 1 tablet (10 mg total) by mouth daily. (Patient taking differently: Take 10 mg by mouth daily as needed.) 30 tablet 11   LORazepam (ATIVAN) 1 MG tablet TAKE 1 TABLET EVERY 8 HOURS AS NEEDED FOR ANXIETY. AVOID REGULAR USE. 24 tablet 0   montelukast (SINGULAIR) 10 MG tablet TAKE 1 TABLET ONCE DAILY. 90 tablet 1   pantoprazole (PROTONIX) 40 MG tablet TAKE (1) TABLET TWICE A DAY BEFORE MEALS. 180 tablet 1   potassium chloride (KLOR-CON) 10 MEQ tablet TAKE FOUR TABLETS BY MOUTH DAILY. 120 tablet 3   prednisoLONE acetate (PRED FORTE) 1 % ophthalmic suspension 1 drop 2 (two) times daily.     Spacer/Aero-Holding Chambers (AEROCHAMBER PLUS) inhaler Use as instructed 1 each 2   vitamin B-12 (CYANOCOBALAMIN) 1000 MCG tablet Take 1,000 mcg by mouth daily.     Vitamin D, Cholecalciferol, 25 MCG (1000 UT) TABS Take 1,000 Units by mouth daily.     No facility-administered medications prior to  visit.     EXAM:  BP (!) 170/60 (BP Location: Left Arm, Patient Position: Sitting, Cuff Size: Normal)   Pulse [name])  56   Temp 98.2 F (36.8 C) (Oral)   Ht 5' (1.524 m)   Wt 162 lb 6.4 oz (73.7 kg)   SpO2 97%   BMI 31.72 kg/m   Body mass index is 31.72 kg/m.  GENERAL: vitals reviewed and listed above, alert, oriented, appears well hydrated and in no acute distress HEENT: atraumatic, conjunctiva  clear, no obvious abnormalities on inspection of external nose and ears OP : Masked NECK: no obvious masses on inspection palpation  LUNGS: clear to auscultation bilaterally, no wheezes, rales or rhonchi,  CV: HRRR, no clubbing cyanosis or 2-3+ edema stable compression stockings MS: moves all extremities right shoulder tender anterior Tatar cuff no deformity range of motion decreased by pain 180 degrees.  No swelling left wrist with firm cystic 1-1/2 cm nodule over the radial gutter not pulsating no redness range of motion of hands and fingers appears to be normal. PSYCH: pleasant and cooperative,   Skin no obvious bruising or petechiae Lab Results  Component Value Date   WBC 4.4 01/27/2021   HGB 9.1 (L) 01/27/2021   HCT 27.9 (L) 01/27/2021   PLT 170.0 01/27/2021   GLUCOSE 108 (H) 01/27/2021   CHOL 198 07/21/2020   TRIG 132.0 07/21/2020   HDL 66.40 07/21/2020   LDLCALC 106 (H) 07/21/2020   ALT 6 07/21/2020   AST 16 07/21/2020   NA 143 01/27/2021   K 3.6 01/27/2021   CL 105 01/27/2021   CREATININE 2.64 (H) 01/27/2021   BUN 40 (H) 01/27/2021   CO2 27 01/27/2021   TSH 2.34 09/19/2019   INR 0.94 12/22/2009   HGBA1C 5.7 01/27/2021   MICROALBUR 103.8 11/04/2020   BP Readings from Last 3 Encounters:  01/27/21 (!) 170/60  01/12/21 (!) 156/68  09/22/20 (!) 162/70    ASSESSMENT AND PLAN:  Discussed the following assessment and plan:  Anemia, chronic disease - Plan: CBC with Differential/Platelet, Basic metabolic panel, Hemoglobin A1c, IBC Panel([facility]), IBC Panel([facility]),  Hemoglobin 123456, Basic metabolic panel, CBC with Differential/Platelet  Right shoulder pain, unspecified chronicity  Fasting hyperglycemia - Plan: CBC with Differential/Platelet, Basic metabolic panel, Hemoglobin A1c, IBC Panel([facility]), IBC Panel([facility]), Hemoglobin 123456, Basic metabolic panel, CBC with Differential/Platelet  Ganglion cyst of volar aspect of left wrist - prob  with sx is left handed   Chronic kidney disease (CKD), stage IV (severe) ([facility]) - Plan: CBC with Differential/Platelet, Basic metabolic panel, Hemoglobin A1c, IBC Panel([facility]), IBC Panel([facility]), Hemoglobin 123456, Basic metabolic panel, CBC with Differential/Platelet  Medication management - Plan: CBC with Differential/Platelet, Basic metabolic panel, Hemoglobin A1c, IBC Panel([facility]), IBC Panel([facility]), Hemoglobin 123456, Basic metabolic panel, CBC with Differential/Platelet  Need for influenza vaccination - Plan: Flu Vaccine QUAD High Dose(Fluad) We will send in topical Voltaren even over-the-counter may be helpful I would have her see her orthopedic team Dr. Randel Pigg group for her shoulder may be a candidate for imaging question injection left wrist probably is a ganglion but is symptomatic and if continues with time would have that also assessed. Lab today blood pressure manipulation per cardiology. Respiratory seems to be stable. Follow-up 4 to 6 months depending on lab as typical. -Patient advised to return or notify health care team  if  new concerns arise.  Patient Instructions  Good to see you today .  Right shoulder  arthritis bursitis   can use topical  antiinflammatories   but  I want you to see  Dr Marlou Sa and his ortho group.   Bum may be a ganglion cyst  that  if continues and causes pain or numbness should be seen by ortho hand surgeon team .    Lab today .  Then plan fu4- 6 mos or thereabouts.    [name]. [name] M.D.

## 2021-01-27 NOTE — Patient Instructions (Addendum)
Good to see you today .  Right shoulder  arthritis bursitis   can use topical  antiinflammatories   but  I want you to see  Dr Marlou Sa and his ortho group.   Bum may be a ganglion cyst  that if continues and causes pain or numbness should be seen by ortho hand surgeon team .    Lab today .  Then plan fu4- 6 mos or thereabouts.

## 2021-01-30 NOTE — Progress Notes (Signed)
Anemia is worse  ( hg down to 9.1)  creatinine up slightly 2.64    blood sugar is excellent  ( a1c is 5.7)   forwarding info to  nephrology .Dr Hollie Salk.  I want you to see hematology again  the anemia is probably related to the kidney function and sometimes adding a medication will help.  Mykal please do a new referral to hematology.  Dx anemia

## 2021-01-31 NOTE — Addendum Note (Signed)
Addended by: Nilda Riggs on: 01/31/2021 03:52 PM   Modules accepted: Orders

## 2021-02-01 ENCOUNTER — Other Ambulatory Visit: Payer: Self-pay | Admitting: Internal Medicine

## 2021-02-03 ENCOUNTER — Other Ambulatory Visit: Payer: Self-pay | Admitting: Internal Medicine

## 2021-02-08 DIAGNOSIS — H3581 Retinal edema: Secondary | ICD-10-CM | POA: Diagnosis not present

## 2021-02-08 DIAGNOSIS — H44113 Panuveitis, bilateral: Secondary | ICD-10-CM | POA: Diagnosis not present

## 2021-02-08 DIAGNOSIS — H35033 Hypertensive retinopathy, bilateral: Secondary | ICD-10-CM | POA: Diagnosis not present

## 2021-02-10 ENCOUNTER — Telehealth: Payer: Self-pay | Admitting: Hematology

## 2021-02-10 NOTE — Telephone Encounter (Signed)
Scheduled appt per 9/19 referral. Pt's granddaughter, [name], is aware.

## 2021-02-12 ENCOUNTER — Other Ambulatory Visit: Payer: Self-pay | Admitting: Emergency Medicine

## 2021-02-15 DIAGNOSIS — H44113 Panuveitis, bilateral: Secondary | ICD-10-CM | POA: Diagnosis not present

## 2021-02-18 ENCOUNTER — Other Ambulatory Visit: Payer: Self-pay | Admitting: Internal Medicine

## 2021-02-22 MED ORDER — BREZTRI AEROSPHERE 160-9-4.8 MCG/ACT IN AERO: INHALATION_SPRAY | RESPIRATORY_TRACT | 6 refills | Status: DC

## 2021-02-23 ENCOUNTER — Telehealth: Payer: Self-pay

## 2021-02-23 ENCOUNTER — Telehealth: Payer: Self-pay | Admitting: Pharmacist

## 2021-02-23 NOTE — Telephone Encounter (Signed)
Unable to LVM or reach patient. Will try again later.

## 2021-02-23 NOTE — Chronic Care Management (AMB) (Signed)
Chronic Care Management Pharmacy Assistant   Name: [name]  MRN: [id number] DOB: July 28, 1933   Reason for Encounter: Disease State/ General Assessment Call.    Conditions to be addressed/monitored: HTN and Asthma.   Recent office visits:  01/27/21 [name] MD (PCP) - seen for anemia and other chronic conditions. Referral placed for hematology/oncology. Patient started on diclofenac sodium 4g 4 times daily. Follow up in 4-6 months depending on results.  Recent consult visits:  02/08/21 Dwana Melena MD [facility]) - seen for initial consult for panuveitis of both eyes, hypertensive retinopathy and retinal edema. No medication changes or follow up noted.   01/12/21 Baltazar Apo MD (Pulmonary Disease) - seen for chronic ashtma without complication. referral placed for DME. No medication changes. Follow up in 12 months.  Hospital visits:  None in previous 6 months  Medications: Outpatient Encounter Medications as of 02/23/2021  Medication Sig   albuterol (PROVENTIL) (2.5 MG/3ML) 0.083% nebulizer solution USE 1 AMPULE EVERY 6 HOURS AS NEEDED FOR WHEEZE   albuterol (VENTOLIN HFA) 108 (90 Base) MCG/ACT inhaler USE 2 PUFFS EVERY 6 HOURS AS NEEDED FOR WHEEZING.   amLODipine (NORVASC) 5 MG tablet TAKE 1 TABLET BY MOUTH DAILY.   atorvastatin (LIPITOR) 20 MG tablet TAKE 1 TABLET ONCE DAILY.   Azelastine-Fluticasone (DYMISTA) 137-50 MCG/ACT SUSP Place 1 spray into the nose in the morning and at bedtime.   Budeson-Glycopyrrol-Formoterol (BREZTRI AEROSPHERE) 160-9-4.8 MCG/ACT AERO INHALE 2 PUFFS INTO THE LUNGS TWICE DAILY, IN THE MORNING & AT BEDTIME.   carvedilol (COREG) 6.25 MG tablet Take 6.25 mg by mouth daily.   citalopram (CELEXA) 10 MG tablet TAKE 1 TABLET ONCE DAILY.   clopidogrel (PLAVIX) 75 MG tablet TAKE 1 TABLET ONCE DAILY.   diclofenac Sodium (VOLTAREN) 1 % GEL Apply 4 g topically 4 (four) times daily.   doxazosin (CARDURA) 2 MG tablet TAKE THREE TABLETS BY MOUTH AT  BEDTIME   ferrous sulfate 325 (65 FE) MG EC tablet Take 1 tablet (325 mg total) by mouth daily with breakfast.   fluorometholone (FML) 0.1 % ophthalmic suspension Place 1 drop into both eyes 2 (two) times daily.    fluticasone (FLONASE) 50 MCG/ACT nasal spray Place 2 sprays into both nostrils daily.   furosemide (LASIX) 40 MG tablet Take by mouth. Take 80 mg twice for 2-3 days, then decrease to 80 mg in the am and 40 mg in the pm   hydrALAZINE (APRESOLINE) 50 MG tablet Take 1.5 tablets (75 mg total) by mouth 3 (three) times daily.   HYDROcodone-acetaminophen (NORCO/VICODIN) 5-325 MG tablet Take 1 tablet by mouth daily as needed for moderate pain.   loratadine (CLARITIN) 10 MG tablet Take 1 tablet (10 mg total) by mouth daily. (Patient taking differently: Take 10 mg by mouth daily as needed.)   LORazepam (ATIVAN) 1 MG tablet TAKE 1 TABLET EVERY 8 HOURS AS NEEDED FOR ANXIETY. AVOID REGULAR USE.   montelukast (SINGULAIR) 10 MG tablet TAKE 1 TABLET ONCE DAILY.   pantoprazole (PROTONIX) 40 MG tablet Take 1 tablet (40 mg total) by mouth 2 (two) times daily.   potassium chloride (KLOR-CON) 10 MEQ tablet TAKE FOUR TABLETS BY MOUTH DAILY.   prednisoLONE acetate (PRED FORTE) 1 % ophthalmic suspension 1 drop 2 (two) times daily.   Spacer/Aero-Holding Chambers (AEROCHAMBER PLUS) inhaler Use as instructed   vitamin B-12 (CYANOCOBALAMIN) 1000 MCG tablet Take 1,000 mcg by mouth daily.   Vitamin D, Cholecalciferol, 25 MCG (1000 UT) TABS Take 1,000 Units by  mouth daily.   No facility-administered encounter medications on file as of 02/23/2021.   Fill history: albuterol sulfate HFA 90 mcg/actuation aerosol inhaler 02/09/2021 25   atorvastatin 20 mg tablet 11/17/2020 90   azelastine-fluticasone 137 mcg-50 mcg/spray nasal spray 02/18/2021 30   Breztri Aerosphere 160 mcg-7mg-4.8mcg/actuation HFA aerosol inhaler 02/22/2021 30   carvedilol 6.25 mg tablet 01/05/2021 90   citalopram 10 mg tablet 02/09/2021 90    clopidogrel 75 mg tablet 02/09/2021 30   diclofenac 1 % topical gel 01/27/2021 7   doxazosin 2 mg tablet 02/01/2021 30   furosemide 40 mg tablet 02/09/2021 90   montelukast 10 mg tablet 02/04/2021 90   pantoprazole 40 mg tablet,delayed release 02/18/2021 90   potassium chloride ER 10 mEq tablet,extended release 01/05/2021 30   amlodipine 5 mg tablet 01/05/2021 90   azathioprine 50 mg tablet 02/08/2021 60   hydralazine 50 mg tablet 02/09/2021 30   prednisolone acetate 1 % eye drops,suspension 02/12/2021 13   Reviewed chart prior to disease state call. Spoke with patient regarding BP  Recent Office Vitals: BP Readings from Last 3 Encounters:  01/27/21 (!) 170/60  01/12/21 (!) 156/68  09/22/20 (!) 162/70   Pulse Readings from Last 3 Encounters:  01/27/21 (!) 56  01/12/21 66  09/22/20 (!) 58    Wt Readings from Last 3 Encounters:  01/27/21 162 lb 6.4 oz (73.7 kg)  01/12/21 161 lb 12.8 oz (73.4 kg)  09/22/20 177 lb 6.4 oz (80.5 kg)     Kidney Function Lab Results  Component Value Date/Time   CREATININE 2.64 (H) 01/27/2021 11:57 AM   CREATININE 2.4 (A) 11/04/2020 12:00 AM   CREATININE 2.45 (H) 07/21/2020 12:30 PM   CREATININE 2.31 (H) 03/10/2020 11:49 AM   CREATININE 1.4 (H) 06/19/2016 01:01 PM   CREATININE 1.6 (H) 08/16/2015 11:36 AM   GFR 16.08 (L) 01/27/2021 11:57 AM   GFRNONAA 19 07/01/2020 12:00 AM   GFRNONAA 19 (L) 03/10/2020 11:49 AM   GFRAA 19 11/04/2020 12:00 AM   GFRAA 22 (L) 03/10/2020 11:49 AM    BMP Latest Ref Rng & Units 01/27/2021 11/04/2020 07/21/2020  Glucose 70 - 99 mg/dL 108(H) - 111(H)  BUN 6 - 23 mg/dL 40(H) 43(A) 42(H)  Creatinine 0.40 - 1.20 mg/dL 2.64(H) 2.4(A) 2.45(H)  BUN/Creat Ratio 6 - 22 (calc) - - -  Sodium 135 - 145 mEq/L 143 142 143  Potassium 3.5 - 5.1 mEq/L 3.6 3.7 3.8  Chloride 96 - 112 mEq/L 105 106 103  CO2 19 - 32 mEq/L 27 27(A) 28  Calcium 8.4 - 10.5 mg/dL 9.3 8.8 9.6    Current antihypertensive regimen:   Amlodipine '5mg'$  - take 1 tablet by mouth daily.  Carvedilol 6.'25mg'$  - take 1 tablet by mouth daily. Hydralazine '50mg'$  - take 1.5 tablets by mouth 3 times daily. Doxazosin 2 mg 3 tablets at bedtime - in PM Furosemide 40 mg 2 tablets in AM and 1 tablet in PM How often are you checking your Blood Pressure? infrequently Current home BP readings: none to report. Yesterday it was 167/48 at the oncologists office. What recent interventions/DTPs have been made by any provider to improve Blood Pressure control since last CPP Visit: None. Any recent hospitalizations or ED visits since last visit with CPP? No  Adherence Review: Is the patient currently on ACE/ARB medication? No Does the patient have >5 day gap between last estimated fill dates? No   Current Asthma regimen:  Breztri 160-9-4.8 mcg/act 2 puffs twice daily Albuterol  HFA as needed Albuterol nebulizer as needed Montelukast 10 mg 1 tablet daily No flowsheet data found. Any recent hospitalizations or ED visits since last visit with CPP? No Denies Asthma symptoms unless she has to go up stairs. What recent interventions/DTPs have been made by any provider to improve breathing since last visit: Have you had exacerbation/flare-up since last visit? No What do you do when you are short of breath?  Rescue medication and Rest  Respiratory Devices/Equipment Do you have a nebulizer? Yes Do you use a Peak Flow Meter? No Do you use a maintenance inhaler? Yes How often do you forget to use your daily inhaler? Never.  Do you use a rescue inhaler? Yes How often do you use your rescue inhaler?  prn Do you use a spacer with your inhaler? No  Adherence Review: Does the patient have >5 day gap between last estimated fill date for maintenance inhaler medications? No  Notes: Spoke with patient and reviewed all medications as prescribed and patient reports as taking all medications and no issues at this time. Patients blood pressure cuff is broken  currently. She plans on getting a new cuff soon. Patient had no numbers to report. Patient will get a new cuff and start keeping a log. Patient tends to have breakfast and will have cereal or oatmeal. Patient eats lunch at home. Patient does not really eat red meat and she does not add salt to her food. Patient drinks plenty of water. Patient stated her asthma has been a lot better since the breztri. Patient walks and does stuff around her house for activity. Patients granddaughter lives with her and patient stated to call her daughter for her rescheduled appointment with [name] in February. Patient thanked me for my call.   Care Gaps:  AWV - no show on 12/27/20. To be rescheduled. Last PCP BP: 170/60 P: 56 Foot exam - never done Dexa scan - never done Covid-19 vaccine booster 5 - overdue since 01/10/21  Star Rating Drugs:  Atorvastatin '20mg'$  - last filled on 11/17/20 90DS at [facility] Pharmacist Assistant [phone]

## 2021-02-24 ENCOUNTER — Inpatient Hospital Stay: Payer: Medicare Other | Attending: Hematology | Admitting: Hematology

## 2021-02-24 ENCOUNTER — Other Ambulatory Visit: Payer: Self-pay

## 2021-02-24 VITALS — BP 167/48 | HR 68 | Temp 98.7°F | Resp 18 | Ht 60.0 in | Wt 161.4 lb

## 2021-02-24 DIAGNOSIS — N183 Chronic kidney disease, stage 3 unspecified: Secondary | ICD-10-CM

## 2021-02-24 DIAGNOSIS — D631 Anemia in chronic kidney disease: Secondary | ICD-10-CM

## 2021-02-24 DIAGNOSIS — D638 Anemia in other chronic diseases classified elsewhere: Secondary | ICD-10-CM

## 2021-02-24 DIAGNOSIS — R634 Abnormal weight loss: Secondary | ICD-10-CM

## 2021-02-24 DIAGNOSIS — K219 Gastro-esophageal reflux disease without esophagitis: Secondary | ICD-10-CM

## 2021-02-24 DIAGNOSIS — Z800 Family history of malignant neoplasm of digestive organs: Secondary | ICD-10-CM | POA: Diagnosis not present

## 2021-02-24 DIAGNOSIS — R1013 Epigastric pain: Secondary | ICD-10-CM

## 2021-02-24 DIAGNOSIS — Z803 Family history of malignant neoplasm of breast: Secondary | ICD-10-CM | POA: Diagnosis not present

## 2021-02-24 DIAGNOSIS — Z86718 Personal history of other venous thrombosis and embolism: Secondary | ICD-10-CM | POA: Diagnosis not present

## 2021-02-24 DIAGNOSIS — R109 Unspecified abdominal pain: Secondary | ICD-10-CM

## 2021-02-24 DIAGNOSIS — Z801 Family history of malignant neoplasm of trachea, bronchus and lung: Secondary | ICD-10-CM | POA: Diagnosis not present

## 2021-02-24 DIAGNOSIS — Z808 Family history of malignant neoplasm of other organs or systems: Secondary | ICD-10-CM | POA: Diagnosis not present

## 2021-02-24 DIAGNOSIS — Z8249 Family history of ischemic heart disease and other diseases of the circulatory system: Secondary | ICD-10-CM | POA: Diagnosis not present

## 2021-02-24 DIAGNOSIS — I129 Hypertensive chronic kidney disease with stage 1 through stage 4 chronic kidney disease, or unspecified chronic kidney disease: Secondary | ICD-10-CM

## 2021-02-24 NOTE — Progress Notes (Addendum)
[name]   [phone] Fax:([phone]) [place] consult Note   Patient Care Team: Panosh, Standley Brooking, MD as PCP - General (Internal Medicine) Nahser, Wonda Cheng, MD as PCP - Cardiology (Cardiology) Marlou Sa, Tonna Corner, MD (Orthopedic Surgery) Truitt Merle, MD as Consulting Physician (Hematology) Madelon Lips, MD as Consulting Physician (Nephrology) Monna Fam, MD as Consulting Physician (Ophthalmology) Viona Gilmore, [facility] as Pharmacist (Pharmacist) 02/24/2021  CHIEF COMPLAINTS/PURPOSE OF CONSULTATION:  Anemia of Chronic Kidney Disease  ASSESSMENT & PLAN:  [name] is a 87 y.o.  female with a history of CKD  1. Anemia, likely anemia of chronic disease secondary to CKD.  -I last saw her in 06/2016, at which time her hgb was up to 10.4 and her ferritin and iron levels were normal. -Since her last visit, her hgb has been trending down, now in the 9 range. -she is taking oral iron and vit B12. -no high suspicion for MDS  - We discussed the benefit on the risks of Epo injection, especially the risk of thrombosis, including heart attack and stroke. She had a history of DVT in the past. I would consider starting Aranesp injection if her hemoglobin persistently below 9. -We will schedule Aranesp injection in 4 weeks if Hg is in low 9 or below 9   2. Chronic Renal Disease, stage 3 -her creatinine has been in the 2 range over the last year, most recently 2.55 on 02/08/21.   3. GERD/dysphagia, Gastric pain -her most recent endoscopy from 04/2017 with Dr. Loletha Carrow showed only gastritis. -given her recent flare of abdominal/gastric pain, I will order CT and refer her back to Dr. Loletha Carrow.   4. History of DVT.  --Patient was informed that patients with a history of DVT or more prone to DVT recurrence. she denies any symptoms of swelling in the legs and she reports mobilization. She also denies any acute shortness of breath.   5. Hypertension   -Continue low salt diet and anti-hypertensive medications per PCP.    6. Weight loss and abdominal pain -on going for a few month -She has mild epigastric tenderness on exam -Stool OB was negative in June 2022 -We will obtain CT abdomen pelvis without contrast to rule out malignancy given her advanced age and symptom -We will also refer her back to GI Dr. Simona Huh to see if she needs upper endoscopy  PLAN: -CT AP to be done in next few weeks -referral back to Dr. Loletha Carrow -lab, f/u, and aranesp injection in 4 weeks   HISTORY OF PRESENTING ILLNESS:  [name] 87 y.o. female is here because of anemia of chronic disease. She was last seen by me on 06/19/16 and subsequently lost to follow up. She presents to the clinic accompanied by her granddaughter, whom she lives with. She reports she is doing generally well overall, but she notes sciatic pain and received steroid shots.  She also reports some occasional stomach pain; she put her hand over the center of her abdomen. She denies constipation or blood in the stool. She notes the pain mostly occurs prior to eating, so she has eaten less and subsequently lost weight. She reports she gets tired a lot and notes her energy level has dropped. Her granddaughter notes she continues to care for herself (cook, clean, drive, etc.), but she does doze off and goes out less often.   REVIEW OF SYSTEMS:   Constitutional: Denies fevers, chills or abnormal night sweats Eyes: Denies blurriness  of vision, double vision or watery eyes Ears, nose, mouth, throat, and face: Denies mucositis or sore throat Respiratory: Denies cough, dyspnea or wheezes Cardiovascular: Denies palpitation, chest discomfort or lower extremity swelling Gastrointestinal:  Denies nausea, heartburn or change in bowel habits Skin: Denies abnormal skin rashes Lymphatics: Denies new lymphadenopathy or easy bruising Neurological:Denies numbness, tingling or new  weaknesses Behavioral/Psych: Mood is stable, no new changes   All other systems were reviewed with the patient and are negative.   MEDICAL HISTORY:  Past Medical History:  Diagnosis Date   Acute superficial venous thrombosis of left lower extremity 03/27/2014   concern about hx and potential extensive on exam tenderness calf and low dose lovenox 40 qd 2-4 weekselevetion and close  fu advised .    Allergy    Anemia    Anxiety    Arthritis    "shoulders" (09/09/2012)   Asthma 09/08/2012   Carotid bruit    [place] 2018  low risk 1 - 39%   Diabetes mellitus without complication ([place])    "Borderline" per pt; being monitored.  no meds per pt   Dyspnea 04/27/2014   GERD (gastroesophageal reflux disease)    Headache(784.0)    "related to my high blood pressure" (09/09/2012)   History of DVT (deep vein thrombosis)    "RLE" (09/09/2012) coumadine cant take asa so on plavix    HTN (hypertension) 09/08/2012   Hyperlipidemia    Lupus ([place])    "cured years ago" (09/09/2012)   Other dysphagia 02/11/2013   Syncope and collapse 01/21/2018   Varicose veins    Varicose veins of leg with complications [id number]    SURGICAL HISTORY: Past Surgical History:  Procedure Laterality Date   ABDOMINAL HYSTERECTOMY     partial   BREAST EXCISIONAL BIOPSY Right    BREAST SURGERY     CARPAL TUNNEL RELEASE Right 2000's   CARPAL TUNNEL RELEASE Bilateral 10/21/2013   Procedure: BILATERAL  CARPAL TUNNEL RELEASE;  Surgeon: [name], MD;  Location: [facility];  Service: Orthopedics;  Laterality: Bilateral;   SHOULDER OPEN ROTATOR CUFF REPAIR Bilateral 2000's   TRIGGER FINGER RELEASE Right 10/21/2013   Procedure: RELEASE TRIGGER FINGER/A-1 PULLEY RIGHT MIDDLE AND RIGHT RING;  Surgeon: [name], MD;  Location: [facility];  Service: Orthopedics;  Laterality: Right;    SOCIAL HISTORY: Social History   Socioeconomic History   Marital status: Single    Spouse name: Not on file    Number of children: 6   Years of education: Not on file   Highest education level: Not on file  Occupational History   Not on file  Tobacco Use   Smoking status: Never   Smokeless tobacco: Never  Vaping Use   Vaping Use: Never used  Substance and Sexual Activity   Alcohol use: No   Drug use: No   Sexual activity: Not Currently    Birth control/protection: Surgical  Other Topics Concern   Not on file  Social History Narrative   2 people living in the home.  Grand daughter.   Up and down through the night      Had 7 children  2 deceased . 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    works 3 days per week currently .   Glasses dentures  Neg tad    Social Determinants of Radio broadcast assistant  Strain: Not on file  Food Insecurity: Not on file  Transportation Needs: Not on file  Physical Activity: Not on file  Stress: Not on file  Social Connections: Not on file  Intimate Partner Violence: Not on file    FAMILY HISTORY: Family History  Problem Relation Age of Onset   Hypertension Mother    Breast cancer Mother    Hypertension Father    Cancer Sister        Colon and Breast Cancer   Breast cancer Sister    Colon cancer Sister        ? 53' s dx - died in 51's   Hypertension Brother    Rectal cancer Brother    Stomach cancer Brother    Breast cancer Brother    Heart attack Daughter    Esophageal cancer Daughter    Lung cancer Other        both parents    Breast cancer Paternal Aunt     ALLERGIES:  is allergic to penicillins and aspirin.  MEDICATIONS:  Current Outpatient Medications  Medication Sig Dispense Refill   albuterol (PROVENTIL) (2.5 MG/3ML) 0.083% nebulizer solution USE 1 AMPULE EVERY 6 HOURS AS NEEDED FOR WHEEZE 75 mL 1   albuterol (VENTOLIN HFA) 108 (90 Base) MCG/ACT inhaler USE 2 PUFFS EVERY 6 HOURS AS NEEDED FOR WHEEZING. 8.5 g 3   amLODipine (NORVASC) 5 MG tablet  TAKE 1 TABLET BY MOUTH DAILY. 90 tablet 3   atorvastatin (LIPITOR) 20 MG tablet TAKE 1 TABLET ONCE DAILY. 90 tablet 1   Azelastine-Fluticasone (DYMISTA) 137-50 MCG/ACT SUSP Place 1 spray into the nose in the morning and at bedtime. 23 g 6   Budeson-Glycopyrrol-Formoterol (BREZTRI AEROSPHERE) 160-9-4.8 MCG/ACT AERO INHALE 2 PUFFS INTO THE LUNGS TWICE DAILY, IN THE MORNING & AT BEDTIME. 10.7 g 6   carvedilol (COREG) 6.25 MG tablet Take 6.25 mg by mouth daily.     citalopram (CELEXA) 10 MG tablet TAKE 1 TABLET ONCE DAILY. 90 tablet 1   clopidogrel (PLAVIX) 75 MG tablet TAKE 1 TABLET ONCE DAILY. 30 tablet 1   diclofenac Sodium (VOLTAREN) 1 % GEL Apply 4 g topically 4 (four) times daily. 100 g 6   doxazosin (CARDURA) 2 MG tablet TAKE THREE TABLETS BY MOUTH AT BEDTIME 270 tablet 1   ferrous sulfate 325 (65 FE) MG EC tablet Take 1 tablet (325 mg total) by mouth daily with breakfast. 30 tablet 3   fluorometholone (FML) 0.1 % ophthalmic suspension Place 1 drop into both eyes 2 (two) times daily.      fluticasone (FLONASE) 50 MCG/ACT nasal spray Place 2 sprays into both nostrils daily. 16 g 2   furosemide (LASIX) 40 MG tablet Take by mouth. Take 80 mg twice for 2-3 days, then decrease to 80 mg in the am and 40 mg in the pm     hydrALAZINE (APRESOLINE) 50 MG tablet Take 1.5 tablets (75 mg total) by mouth 3 (three) times daily. 405 tablet 3   HYDROcodone-acetaminophen (NORCO/VICODIN) 5-325 MG tablet Take 1 tablet by mouth daily as needed for moderate pain. 15 tablet 0   loratadine (CLARITIN) 10 MG tablet Take 1 tablet (10 mg total) by mouth daily. (Patient taking differently: Take 10 mg by mouth daily as needed.) 30 tablet 11   LORazepam (ATIVAN) 1 MG tablet TAKE 1 TABLET EVERY 8 HOURS AS NEEDED FOR ANXIETY. AVOID REGULAR USE. 24 tablet 0   montelukast (SINGULAIR) 10 MG tablet TAKE 1 TABLET ONCE DAILY. 90 tablet  1   pantoprazole (PROTONIX) 40 MG tablet Take 1 tablet (40 mg total) by mouth 2 (two) times daily.  180 tablet 1   potassium chloride (KLOR-CON) 10 MEQ tablet TAKE FOUR TABLETS BY MOUTH DAILY. 120 tablet 3   prednisoLONE acetate (PRED FORTE) 1 % ophthalmic suspension 1 drop 2 (two) times daily.     Spacer/Aero-Holding Chambers (AEROCHAMBER PLUS) inhaler Use as instructed 1 each 2   vitamin B-12 (CYANOCOBALAMIN) 1000 MCG tablet Take 1,000 mcg by mouth daily.     Vitamin D, Cholecalciferol, 25 MCG (1000 UT) TABS Take 1,000 Units by mouth daily.     No current facility-administered medications for this visit.    PHYSICAL EXAMINATION: ECOG PERFORMANCE STATUS: 1 - Symptomatic but completely ambulatory  Vitals:   02/24/21 1354  BP: (!) 167/48  Pulse: 68  Resp: 18  Temp: 98.7 F (37.1 C)  SpO2: 99%   Filed Weights   02/24/21 1354  Weight: 161 lb 6.4 oz (73.2 kg)    GENERAL:alert, no distress and comfortable SKIN: skin color, texture, turgor are normal, no rashes or significant lesions EYES: normal, conjunctiva are pink and non-injected, sclera clear OROPHARYNX:no exudate, no erythema and lips, buccal mucosa, and tongue normal  NECK: supple, thyroid normal size, non-tender, without nodularity LYMPH:  no palpable lymphadenopathy in the cervical, axillary or inguinal LUNGS: clear to auscultation and percussion with normal breathing effort HEART: regular rate & rhythm, (+) murmurs present, (+) lower extremity edema ABDOMEN:abdomen soft, and normal bowel sounds, (+) tenderness to stomach area Musculoskeletal:no cyanosis of digits and no clubbing  PSYCH: alert & oriented x 3 with fluent speech NEURO: no focal motor/sensory deficits  LABORATORY DATA:  I have reviewed the data as listed CBC Latest Ref Rng & Units 01/27/2021 07/01/2020 03/10/2020  WBC 4.0 - 10.5 K/uL 4.4 4.7 4.8  Hemoglobin 12.0 - 15.0 g/dL 9.1(L) 10.0(A) 9.4(L)  Hematocrit 36.0 - 46.0 % 27.9(L) 31(A) 28.7(L)  Platelets 150.0 - 400.0 K/uL 170.0 178 269    CMP Latest Ref Rng & Units 01/27/2021 11/04/2020 07/21/2020   Glucose 70 - 99 mg/dL 108(H) - 111(H)  BUN 6 - 23 mg/dL 40(H) 43(A) 42(H)  Creatinine 0.40 - 1.20 mg/dL 2.64(H) 2.4(A) 2.45(H)  Sodium 135 - 145 mEq/L 143 142 143  Potassium 3.5 - 5.1 mEq/L 3.6 3.7 3.8  Chloride 96 - 112 mEq/L 105 106 103  CO2 19 - 32 mEq/L 27 27(A) 28  Calcium 8.4 - 10.5 mg/dL 9.3 8.8 9.6  Total Protein 6.0 - 8.3 g/dL - - 6.6  Total Bilirubin 0.2 - 1.2 mg/dL - - 0.6  Alkaline Phos 39 - 117 U/L - - 46  AST 0 - 37 U/L - - 16  ALT 0 - 35 U/L - - 6     RADIOGRAPHIC STUDIES: I have personally reviewed the radiological images as listed and agreed with the findings in the report. No results found.  No orders of the defined types were placed in this encounter.   All questions were answered. The patient knows to call the clinic with any problems, questions or concerns. The total time spent in the appointment was 45 minutes.     Truitt Merle, MD 02/24/2021 1:51 PM  I, Wilburn Mylar, am acting as scribe for Truitt Merle, MD.   I have reviewed the above documentation for accuracy and completeness, and I agree with the above.

## 2021-02-25 ENCOUNTER — Encounter: Payer: Self-pay | Admitting: Hematology

## 2021-02-28 ENCOUNTER — Telehealth: Payer: Self-pay

## 2021-02-28 ENCOUNTER — Telehealth: Payer: Self-pay | Admitting: Gastroenterology

## 2021-02-28 ENCOUNTER — Other Ambulatory Visit: Payer: Self-pay

## 2021-02-28 NOTE — Telephone Encounter (Signed)
This nurse left a message for patient's granddaughter related to appointment date and time for labs and CT Scant.  Request a return call to facility to verify that date and time is acceptable.  No further concerns at this time.

## 2021-02-28 NOTE — Telephone Encounter (Signed)
Called to offer an appointment with Dr Buddy Duty  by Dr Burr Medico. No answer. Left a voicemail to call back.

## 2021-03-02 DIAGNOSIS — N2581 Secondary hyperparathyroidism of renal origin: Secondary | ICD-10-CM | POA: Diagnosis not present

## 2021-03-02 DIAGNOSIS — D631 Anemia in chronic kidney disease: Secondary | ICD-10-CM | POA: Diagnosis not present

## 2021-03-02 DIAGNOSIS — I129 Hypertensive chronic kidney disease with stage 1 through stage 4 chronic kidney disease, or unspecified chronic kidney disease: Secondary | ICD-10-CM | POA: Diagnosis not present

## 2021-03-02 DIAGNOSIS — N184 Chronic kidney disease, stage 4 (severe): Secondary | ICD-10-CM | POA: Diagnosis not present

## 2021-03-02 DIAGNOSIS — R1013 Epigastric pain: Secondary | ICD-10-CM | POA: Diagnosis not present

## 2021-03-02 DIAGNOSIS — I739 Peripheral vascular disease, unspecified: Secondary | ICD-10-CM | POA: Diagnosis not present

## 2021-03-02 LAB — IRON,TIBC AND FERRITIN PANEL
Ferritin: 143
Iron: 55

## 2021-03-02 LAB — MICROALBUMIN, URINE: Microalb, Ur: 119

## 2021-03-09 ENCOUNTER — Inpatient Hospital Stay: Payer: Medicare Other

## 2021-03-09 ENCOUNTER — Ambulatory Visit (HOSPITAL_COMMUNITY): Payer: Medicare Other

## 2021-03-11 ENCOUNTER — Telehealth: Payer: Self-pay | Admitting: Internal Medicine

## 2021-03-11 ENCOUNTER — Other Ambulatory Visit: Payer: Self-pay | Admitting: Internal Medicine

## 2021-03-11 NOTE — Telephone Encounter (Signed)
Left message for patient to call back and schedule Medicare Annual Wellness Visit (AWV) either virtually or in office. Left  my jabber number [phone] ° ° °AWV-I per [place] 05/15/09 °please schedule at anytime with [facility] Nurse Health Advisor 1 or 2 ° ° °This should be a 45 minute visit.  °

## 2021-03-16 ENCOUNTER — Inpatient Hospital Stay: Payer: Medicare Other | Attending: Hematology

## 2021-03-16 ENCOUNTER — Other Ambulatory Visit: Payer: Self-pay

## 2021-03-16 ENCOUNTER — Encounter: Payer: Self-pay | Admitting: Internal Medicine

## 2021-03-16 ENCOUNTER — Ambulatory Visit (HOSPITAL_COMMUNITY)
Admission: RE | Admit: 2021-03-16 | Discharge: 2021-03-16 | Disposition: A | Discharge status: Home / Self Care | Payer: Medicare Other | Source: Ambulatory Visit | Attending: Hematology | Admitting: Hematology

## 2021-03-16 DIAGNOSIS — K5730 Diverticulosis of large intestine without perforation or abscess without bleeding: Secondary | ICD-10-CM | POA: Diagnosis not present

## 2021-03-16 DIAGNOSIS — D638 Anemia in other chronic diseases classified elsewhere: Secondary | ICD-10-CM

## 2021-03-16 DIAGNOSIS — K449 Diaphragmatic hernia without obstruction or gangrene: Secondary | ICD-10-CM | POA: Diagnosis not present

## 2021-03-16 DIAGNOSIS — D631 Anemia in chronic kidney disease: Secondary | ICD-10-CM | POA: Insufficient documentation

## 2021-03-16 DIAGNOSIS — K219 Gastro-esophageal reflux disease without esophagitis: Secondary | ICD-10-CM | POA: Insufficient documentation

## 2021-03-16 DIAGNOSIS — Z886 Allergy status to analgesic agent status: Secondary | ICD-10-CM | POA: Insufficient documentation

## 2021-03-16 DIAGNOSIS — Z880 Allergy status to penicillin: Secondary | ICD-10-CM | POA: Insufficient documentation

## 2021-03-16 DIAGNOSIS — Z7902 Long term (current) use of antithrombotics/antiplatelets: Secondary | ICD-10-CM | POA: Insufficient documentation

## 2021-03-16 DIAGNOSIS — Z86718 Personal history of other venous thrombosis and embolism: Secondary | ICD-10-CM | POA: Insufficient documentation

## 2021-03-16 DIAGNOSIS — N183 Chronic kidney disease, stage 3 unspecified: Secondary | ICD-10-CM | POA: Insufficient documentation

## 2021-03-16 DIAGNOSIS — I700 Atherosclerosis of aorta: Secondary | ICD-10-CM | POA: Diagnosis not present

## 2021-03-16 DIAGNOSIS — R1013 Epigastric pain: Secondary | ICD-10-CM | POA: Insufficient documentation

## 2021-03-16 DIAGNOSIS — R1310 Dysphagia, unspecified: Secondary | ICD-10-CM | POA: Insufficient documentation

## 2021-03-16 DIAGNOSIS — I129 Hypertensive chronic kidney disease with stage 1 through stage 4 chronic kidney disease, or unspecified chronic kidney disease: Secondary | ICD-10-CM | POA: Insufficient documentation

## 2021-03-16 DIAGNOSIS — J45909 Unspecified asthma, uncomplicated: Secondary | ICD-10-CM | POA: Insufficient documentation

## 2021-03-16 DIAGNOSIS — R109 Unspecified abdominal pain: Secondary | ICD-10-CM | POA: Insufficient documentation

## 2021-03-16 DIAGNOSIS — Z8719 Personal history of other diseases of the digestive system: Secondary | ICD-10-CM | POA: Insufficient documentation

## 2021-03-16 DIAGNOSIS — D649 Anemia, unspecified: Secondary | ICD-10-CM | POA: Diagnosis not present

## 2021-03-16 DIAGNOSIS — R634 Abnormal weight loss: Secondary | ICD-10-CM | POA: Insufficient documentation

## 2021-03-16 LAB — COMPREHENSIVE METABOLIC PANEL
ALT: 7 U/L (ref 0–44)
AST: 18 U/L (ref 15–41)
Albumin: 3.7 g/dL (ref 3.5–5.0)
Alkaline Phosphatase: 44 U/L (ref 38–126)
Anion gap: 13 (ref 5–15)
BUN: 41 mg/dL — ABNORMAL HIGH (ref 8–23)
CO2: 25 mmol/L (ref 22–32)
Calcium: 9.3 mg/dL (ref 8.9–10.3)
Chloride: 105 mmol/L (ref 98–111)
Creatinine, Ser: 2.26 mg/dL — ABNORMAL HIGH (ref 0.44–1.00)
GFR, Estimated: 21 mL/min — ABNORMAL LOW (ref >60)
Glucose, Bld: 116 mg/dL — ABNORMAL HIGH (ref 70–99)
Potassium: 3.9 mmol/L (ref 3.5–5.1)
Sodium: 143 mmol/L (ref 135–145)
Total Bilirubin: 0.4 mg/dL (ref 0.3–1.2)
Total Protein: 6.9 g/dL (ref 6.5–8.1)

## 2021-03-16 LAB — FERRITIN: Ferritin: 124 ng/mL (ref 11–307)

## 2021-03-16 LAB — CBC WITH DIFFERENTIAL/PLATELET
Abs Immature Granulocytes: 0.03 10*3/uL (ref 0.00–0.07)
Basophils Absolute: 0 10*3/uL (ref 0.0–0.1)
Basophils Relative: 1 %
Eosinophils Absolute: 0.1 10*3/uL (ref 0.0–0.5)
Eosinophils Relative: 2 %
HCT: 30.2 % — ABNORMAL LOW (ref 36.0–46.0)
Hemoglobin: 9.6 g/dL — ABNORMAL LOW (ref 12.0–15.0)
Immature Granulocytes: 1 %
Lymphocytes Relative: 31 %
Lymphs Abs: 1.3 10*3/uL (ref 0.7–4.0)
MCH: 28.5 pg (ref 26.0–34.0)
MCHC: 31.8 g/dL (ref 30.0–36.0)
MCV: 89.6 fL (ref 80.0–100.0)
Monocytes Absolute: 0.3 10*3/uL (ref 0.1–1.0)
Monocytes Relative: 7 %
Neutro Abs: 2.6 10*3/uL (ref 1.7–7.7)
Neutrophils Relative %: 58 %
Platelets: 203 10*3/uL (ref 150–400)
RBC: 3.37 MIL/uL — ABNORMAL LOW (ref 3.87–5.11)
RDW: 12.8 % (ref 11.5–15.5)
WBC: 4.3 10*3/uL (ref 4.0–10.5)
nRBC: 0 % (ref 0.0–0.2)

## 2021-03-21 ENCOUNTER — Ambulatory Visit: Payer: Medicare Other | Admitting: Cardiovascular Disease

## 2021-03-21 ENCOUNTER — Encounter: Payer: Self-pay | Admitting: Cardiovascular Disease

## 2021-03-21 ENCOUNTER — Other Ambulatory Visit: Payer: Self-pay

## 2021-03-21 VITALS — BP 162/62 | HR 71 | Ht 60.0 in | Wt 158.4 lb

## 2021-03-21 DIAGNOSIS — I10 Essential (primary) hypertension: Secondary | ICD-10-CM | POA: Diagnosis not present

## 2021-03-21 DIAGNOSIS — I130 Hypertensive heart and chronic kidney disease with heart failure and stage 1 through stage 4 chronic kidney disease, or unspecified chronic kidney disease: Secondary | ICD-10-CM

## 2021-03-21 NOTE — Progress Notes (Signed)
Cardiology    Patient ID: [name] MRN: [id number]; DOB: 07/13/1933    Primary Care Provider: Burnis Medin, MD Primary Cardiologist: Mertie Moores, MD  Primary Electrophysiologist:  None   Problem list 1.  Hypertension 2.  Hyperlipidemia 3.  Prior DVT 4.  Lupus 5.  Chronic kidney disease 6.  Syncope 7.  Leg edema   Chief Complaint:  Syncope   Patient Profile:   [name] is a 87 y.o. female with history of hypertension and hyperlipidemia.  She was recently seen by Angela Dopp, PA for an episode of syncope.  Seen in the past for an episode of leg swelling in 2015.      [name] is seen for further evaluation following her episode of syncope in July.  During that time, her metoprolol had just been changed to carvedilol.   Echocardiogram revealed normal left ventricular systolic function.  She has moderate pulmonary hypertension with an estimated PA pressure of 45. V Q scan was low risk for pulmonary embolus. She had a 30 day monitor that revealed normal sinus rhythm and sinus bradycardia.  She had one 5 beat run of ventricular ectopy which was not significant.  No further episodes of syncope  BP is still elevated.   Still eats some salty foods.   July, 23, 2020 :  [name] is seen for follow up visit BP is still mildy elevated.  Was hospitalized this past weekend.   Has vomitting and diarrhea.   These have resolved.  Troponin was 0.12   Still eating a very salty diet  Legs remains swollen .   Denies any chest pain .  Breathing is better.  Last echo was 5/19 which revealed normal LV function. Moderate pulmonary HTN - PA pressure of 45 .   August 06, 2019:  [name] is seen today for follow-up visit.  She has a history of hypertension.  She has a history of moderate pulmonary hypertension with an estimated PA pressure of 45. In the past she has been eating a very high salt diet.  She is doing well.  She is not had any further episodes of syncope  or presyncope.  She is not had any episodes of chest pain or shortness of breath.  She is been trying to avoid eating excessive salt.  Her blood pressure and heart rate are well controlled in the office visit today.  March 21, 2021: [name] is seen today for follow-up of her hypertension, chronic diastolic congestive heart failure, moderate pulmonary hypertension with an estimated PA pressure of 45 mm of broccoli.  She has been eating a very high salt diet in the past. She days she is eating better now. No CP or dyspnea  Her leg swelling has improved.  Has not taken her BP meds this am.   Past Medical History:  Diagnosis Date   Acute superficial venous thrombosis of left lower extremity 03/27/2014   concern about hx and potential extensive on exam tenderness calf and low dose lovenox 40 qd 2-4 weekselevetion and close  fu advised .    Allergy    Anemia    Anxiety    Arthritis    "shoulders" (09/09/2012)   Asthma 09/08/2012   Carotid bruit    [place] 2018  low risk 1 - 39%   Diabetes mellitus without complication ([place])    "Borderline" per pt; being monitored.  no meds per pt   Dyspnea 04/27/2014   GERD (gastroesophageal reflux disease)    Headache(784.0)    "  related to my high blood pressure" (09/09/2012)   History of DVT (deep vein thrombosis)    "RLE" (09/09/2012) coumadine cant take asa so on plavix    HTN (hypertension) 09/08/2012   Hyperlipidemia    Lupus ([place])    "cured years ago" (09/09/2012)   Other dysphagia 02/11/2013   Syncope and collapse 01/21/2018   Varicose veins    Varicose veins of leg with complications 8/33/8250    Past Surgical History:  Procedure Laterality Date   ABDOMINAL HYSTERECTOMY     partial   BREAST EXCISIONAL BIOPSY Right    BREAST SURGERY     CARPAL TUNNEL RELEASE Right 2000's   CARPAL TUNNEL RELEASE Bilateral 10/21/2013   Procedure: BILATERAL  CARPAL TUNNEL RELEASE;  Surgeon: [name], MD;  Location: [facility];  Service: Orthopedics;   Laterality: Bilateral;   SHOULDER OPEN ROTATOR CUFF REPAIR Bilateral 2000's   TRIGGER FINGER RELEASE Right 10/21/2013   Procedure: RELEASE TRIGGER FINGER/A-1 PULLEY RIGHT MIDDLE AND RIGHT RING;  Surgeon: [name], MD;  Location: [facility];  Service: Orthopedics;  Laterality: Right;     Medications Prior to Admission: Prior to Admission medications   Medication Sig Start Date End Date Taking? Authorizing Provider  acetaminophen (TYLENOL) 500 MG tablet Take 1,000 mg by mouth every 6 (six) hours as needed for moderate pain. Reported on 04/30/2015   Yes [provider]  albuterol (PROVENTIL) (2.5 MG/3ML) 0.083% nebulizer solution USE 1 AMPULE EVERY 6 HOURS AS NEEDED FOR WHEEZE 10/23/17  Yes Panosh, Standley Brooking, MD  amLODipine-olmesartan (AZOR) 10-40 MG tablet Take 1 tablet by mouth daily.   Yes Madelon Lips, MD  carvedilol (COREG) 12.5 MG tablet Take 6.25 mg by mouth daily. 1/2 tablet daily   Yes Madelon Lips, MD  citalopram (CELEXA) 20 MG tablet TAKE 1 TABLET ONCE DAILY. 12/14/17  Yes Panosh, Standley Brooking, MD  clopidogrel (PLAVIX) 75 MG tablet TAKE 1 TABLET ONCE DAILY. 10/19/17  Yes Panosh, Standley Brooking, MD  diclofenac sodium (VOLTAREN) 1 % GEL APPLY 4GM TO AFFECTED AREA(S) 4 TIMES A DAY. 07/13/17  Yes Panosh, Standley Brooking, MD  doxazosin (CARDURA) 2 MG tablet TAKE 3 TABLETS AT BEDTIME. 12/13/17  Yes Panosh, Standley Brooking, MD  fenofibrate 160 MG tablet TAKE 1 TABLET ONCE DAILY. 12/13/17  Yes Panosh, Standley Brooking, MD  ferrous sulfate 325 (65 FE) MG EC tablet Take 1 tablet (325 mg total) by mouth daily with breakfast. 12/15/14  Yes Panosh, Standley Brooking, MD  fluorometholone (FML) 0.1 % ophthalmic suspension Place 1 drop into both eyes 2 (two) times daily.  09/13/17  Yes [provider]  Fluticasone-Salmeterol (ADVAIR) 250-50 MCG/DOSE AEPB USE 1 PUFF TWICE DAILY. 11/09/17  Yes Panosh, Standley Brooking, MD  furosemide (LASIX) 80 MG tablet Take 80 mg by mouth daily.   Yes Madelon Lips, MD  hydrALAZINE (APRESOLINE)  25 MG tablet Take 1 tablet by mouth daily. 12/03/17  Yes [provider]  lidocaine (LIDODERM) 5 % Place 1 patch onto the skin daily. Remove & Discard patch within 12 hours or as directed by MD Patient taking differently: Place 1 patch onto the skin daily as needed (pain). Remove & Discard patch within 12 hours or as directed by MD 01/04/17  Yes Oxford, Orson Ape, FNP  LORazepam (ATIVAN) 1 MG tablet Take 1 tablet (1 mg total) by mouth every 8 (eight) hours as needed for anxiety. Avoid regular use 08/28/17  Yes Panosh, Standley Brooking, MD  montelukast (SINGULAIR) 10  MG tablet TAKE 1 TABLET ONCE DAILY. 10/31/17  Yes Panosh, Standley Brooking, MD  pantoprazole (PROTONIX) 40 MG tablet TAKE (1) TABLET TWICE A DAY BEFORE MEALS. 10/23/17  Yes Panosh, Standley Brooking, MD  potassium chloride (K-DUR,KLOR-CON) 10 MEQ tablet Take 10 mEq by mouth 4 (four) times daily.   Yes [provider]  PROAIR HFA 108 (90 Base) MCG/ACT inhaler USE 2 PUFFS EVERY 6 HOURS AS NEEDED FOR WHEEZING. 12/13/17  Yes Panosh, Standley Brooking, MD  RESTASIS 0.05 % ophthalmic emulsion Place 1 drop into both eyes 2 (two) times daily.  01/01/15  Yes [provider]  Spacer/Aero-Holding Chambers (AEROCHAMBER PLUS) inhaler Use as instructed 05/26/16  Yes Melynda Ripple, MD  vitamin B-12 (CYANOCOBALAMIN) 1000 MCG tablet Take 1,000 mcg by mouth daily.   Yes [provider]     Allergies:    Allergies  Allergen Reactions   Penicillins Nausea And Vomiting and Other (See Comments)    Has patient had a PCN reaction causing immediate rash, facial/tongue/throat swelling, SOB or lightheadedness with hypotension: Y Has patient had a PCN reaction causing severe rash involving mucus membranes or skin necrosis: Y Has patient had a PCN reaction that required hospitalization: N Has patient had a PCN reaction occurring within the last 10 years: N If all of the above answers are "NO", then may proceed with Cephalosporin use.    Aspirin Other (See Comments)     Wheezing Acetaminophen is OK    Social History:   Social History   Socioeconomic History   Marital status: Single    Spouse name: Not on file   Number of children: 6   Years of education: Not on file   Highest education level: Not on file  Occupational History   Not on file  Tobacco Use   Smoking status: Never   Smokeless tobacco: Never  Vaping Use   Vaping Use: Never used  Substance and Sexual Activity   Alcohol use: No   Drug use: No   Sexual activity: Not Currently    Birth control/protection: Surgical  Other Topics Concern   Not on file  Social History Narrative   2 people living in the home.  Grand daughter.   Up and down through the night      Had 7 children  2 deceased . 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    works 3 days per week currently .   Glasses dentures  Neg tad    Social Determinants of Health   Financial Resource Strain: Not on file  Food Insecurity: Not on file  Transportation Needs: Not on file  Physical Activity: Not on file  Stress: Not on file  Social Connections: Not on file  Intimate Partner Violence: Not on file    Family History:   The patient's family history includes Breast cancer in her brother, mother, paternal aunt, and sister; Cancer in her sister; Colon cancer in her sister; Esophageal cancer in her daughter; Heart attack in her daughter; Hypertension in her brother, father, and mother; Lung cancer in an other family member; Rectal cancer in her brother; Stomach cancer in her brother.    ROS:  Please see the history of present illness.  All other ROS reviewed and negative.     Physical Exam/Data:   Physical Exam: Pulse 71, height 5' (1.524 m), weight 158 lb 6.4 oz (71.8 kg), SpO2 96 %.  GEN:  elderly, frail female,  NAD  HEENT: Normal NECK: No JVD; No carotid bruits LYMPHATICS: No lymphadenopathy CARDIAC: RRR  , no murmur  RESPIRATORY:   Clear to auscultation without rales, wheezing or rhonchi  ABDOMEN: Soft, non-tender, non-distended MUSCULOSKELETAL:  1+ bilateral edema No deformity  SKIN: Warm and dry NEUROLOGIC:  Alert and oriented x 3     EKG:       Relevant CV Studies:   Laboratory Data:  Chemistry Recent Labs  Lab 03/16/21 0847  NA 143  K 3.9  CL 105  CO2 25  GLUCOSE 116*  BUN 41*  CREATININE 2.26*  CALCIUM 9.3  GFRNONAA 21*  ANIONGAP 13    Recent Labs  Lab 03/16/21 0847  PROT 6.9  ALBUMIN 3.7  AST 18  ALT 7  ALKPHOS 44  BILITOT 0.4   Hematology Recent Labs  Lab 03/16/21 0847  WBC 4.3  RBC 3.37*  HGB 9.6*  HCT 30.2*  MCV 89.6  MCH 28.5  MCHC 31.8  RDW 12.8  PLT 203   Cardiac Enzymes No results for input(s): TROPONINI in the last 168 hours. No results for input(s): TROPIPOC in the last 168 hours.  BNPNo results for input(s): BNP, PROBNP in the last 168 hours.  DDimer No results for input(s): DDIMER in the last 168 hours.  Radiology/Studies:  No results found.  Assessment and Plan:   1.   Chronic diastolic CHF :   Symptoms are stable   2.  Hypertension:   she is better with her salt now. BP is elevated this am because she has not taken her meds.  Will continue current meds.    3.   Pulmonary HTN:  breathing is stable   4. Leg edema :  stable    5.  CKD :  followed by her primary MD     Signed, Mertie Moores, MD  03/21/2021 11:27 AM

## 2021-03-21 NOTE — Patient Instructions (Signed)
Medication Instructions:  Your physician recommends that you continue on your current medications as directed. Please refer to the Current Medication list given to you today.  *If you need a refill on your cardiac medications before your next appointment, please call your pharmacy*   Lab Work: NONE If you have labs (blood work) drawn today and your tests are completely normal, you will receive your results only by: [place] (if you have MyChart) OR A paper copy in the mail If you have any lab test that is abnormal or we need to change your treatment, we will call you to review the results.   Testing/Procedures: NONE   Follow-Up: At [facility], you and your health needs are our priority.  As part of our continuing mission to provide you with exceptional heart care, we have created designated Provider Care Teams.  These Care Teams include your primary Cardiologist (physician) and Advanced Practice Providers (APPs -  Physician Assistants and Nurse Practitioners) who all work together to provide you with the care you need, when you need it.   Your next appointment:   1 year(s)  The format for your next appointment:   In Person  Provider:   Robbie Lis, PA-C  or Richardson Dopp, PA-C  :1}    Other Instructions NONE

## 2021-03-22 ENCOUNTER — Other Ambulatory Visit: Payer: Self-pay | Admitting: Internal Medicine

## 2021-03-22 ENCOUNTER — Telehealth: Payer: Self-pay | Admitting: Pharmacist

## 2021-03-22 NOTE — Chronic Care Management (AMB) (Signed)
Chronic Care Management Pharmacy Assistant   Name: [name]  MRN: [id number] DOB: 02/28/1934  Reason for Encounter: General Adherence Call.    Primary concerns for visit include: Medication Adherence for Atorvastatin.  Recent office visits:  None.  Recent consult visits:  03/21/21 Angela Moores MD (Cardiology) - seen for Hypertensive heart disease with congestive heart failure and chronic kidney disease, unspecified CKD stage and unspecified heart failure type. No medication changes. Follow up in 1 year.   Hospital visits:  None in previous 6 months  Medications: Outpatient Encounter Medications as of 03/22/2021  Medication Sig   albuterol (PROVENTIL) (2.5 MG/3ML) 0.083% nebulizer solution USE 1 AMPULE EVERY 6 HOURS AS NEEDED FOR WHEEZE   albuterol (VENTOLIN HFA) 108 (90 Base) MCG/ACT inhaler USE 2 PUFFS EVERY 6 HOURS AS NEEDED FOR WHEEZING.   amLODipine (NORVASC) 5 MG tablet TAKE 1 TABLET BY MOUTH DAILY.   atorvastatin (LIPITOR) 20 MG tablet TAKE 1 TABLET ONCE DAILY.   Azelastine-Fluticasone (DYMISTA) 137-50 MCG/ACT SUSP Place 1 spray into the nose in the morning and at bedtime.   Budeson-Glycopyrrol-Formoterol (BREZTRI AEROSPHERE) 160-9-4.8 MCG/ACT AERO INHALE 2 PUFFS INTO THE LUNGS TWICE DAILY, IN THE MORNING & AT BEDTIME.   carvedilol (COREG) 6.25 MG tablet Take 6.25 mg by mouth daily.   citalopram (CELEXA) 10 MG tablet TAKE 1 TABLET ONCE DAILY.   clopidogrel (PLAVIX) 75 MG tablet TAKE 1 TABLET ONCE DAILY.   diclofenac Sodium (VOLTAREN) 1 % GEL Apply 4 g topically 4 (four) times daily.   doxazosin (CARDURA) 2 MG tablet TAKE THREE TABLETS BY MOUTH AT BEDTIME   ferrous sulfate 325 (65 FE) MG EC tablet Take 1 tablet (325 mg total) by mouth daily with breakfast.   fluorometholone (FML) 0.1 % ophthalmic suspension Place 1 drop into both eyes 2 (two) times daily.    fluticasone (FLONASE) 50 MCG/ACT nasal spray Place 2 sprays into both nostrils daily.   furosemide (LASIX)  40 MG tablet Take by mouth. Take 80 mg twice for 2-3 days, then decrease to 80 mg in the am and 40 mg in the pm   hydrALAZINE (APRESOLINE) 50 MG tablet Take 1.5 tablets (75 mg total) by mouth 3 (three) times daily.   HYDROcodone-acetaminophen (NORCO/VICODIN) 5-325 MG tablet Take 1 tablet by mouth daily as needed for moderate pain. (Patient not taking: Reported on 03/21/2021)   loratadine (CLARITIN) 10 MG tablet Take 1 tablet (10 mg total) by mouth daily.   LORazepam (ATIVAN) 1 MG tablet TAKE 1 TABLET EVERY 8 HOURS AS NEEDED FOR ANXIETY. AVOID REGULAR USE. (Patient not taking: Reported on 03/21/2021)   montelukast (SINGULAIR) 10 MG tablet TAKE 1 TABLET ONCE DAILY.   pantoprazole (PROTONIX) 40 MG tablet Take 1 tablet (40 mg total) by mouth 2 (two) times daily.   potassium chloride (KLOR-CON) 10 MEQ tablet TAKE FOUR TABLETS BY MOUTH DAILY.   prednisoLONE acetate (PRED FORTE) 1 % ophthalmic suspension 1 drop 2 (two) times daily.   Spacer/Aero-Holding Chambers (AEROCHAMBER PLUS) inhaler Use as instructed   vitamin B-12 (CYANOCOBALAMIN) 1000 MCG tablet Take 1,000 mcg by mouth daily.   Vitamin D, Cholecalciferol, 25 MCG (1000 UT) TABS Take 1,000 Units by mouth daily.   No facility-administered encounter medications on file as of 03/22/2021.   Fill History: albuterol sulfate HFA 90 mcg/actuation aerosol inhaler 02/09/2021 25   atorvastatin 20 mg tablet 11/17/2020 90   azelastine-fluticasone 137 mcg-50 mcg/spray nasal spray 02/18/2021 30   Breztri Aerosphere 160 mcg-58mcg-4.8mcg/actuation HFA aerosol inhaler  02/22/2021 30   carvedilol 6.25 mg tablet 01/05/2021 90   citalopram 10 mg tablet 02/09/2021 90   clopidogrel 75 mg tablet 02/09/2021 30   doxazosin 2 mg tablet 03/11/2021 30   furosemide 40 mg tablet 02/09/2021 90   montelukast 10 mg tablet 02/04/2021 90   pantoprazole 40 mg tablet,delayed release 02/18/2021 90   potassium chloride ER 10 mEq tablet,extended release 03/02/2021 30    amlodipine 5 mg tablet 01/05/2021 90   azathioprine 50 mg tablet 02/08/2021 60   hydralazine 50 mg tablet 03/11/2021 30   prednisolone acetate 1 % eye drops,suspension 02/12/2021 13   Notes: Spoke with [name] at [facility] who verified patients last fill date for Atorvastatin. Patient stated she just ran out due to having some atorvastatin left over last time she filled it that she completed before taking her last supply in July. Patient stated she was going to call the pharmacy when we hung up to refill her Atorvastatin.   Care Gaps:  AWV - no show on 12/27/20. To be rescheduled. Last PCP BP: 170/60 P: 56 Foot exam - never done Dexa scan - never done Covid-19 vaccine booster 5 - overdue since 01/10/21    Star Rating Drugs:  Atorvastatin 20mg  - last filled on 11/17/20 90DS at [facility] Pharmacist Assistant [phone]

## 2021-03-24 ENCOUNTER — Inpatient Hospital Stay: Payer: Medicare Other | Admitting: Hematology

## 2021-03-24 ENCOUNTER — Inpatient Hospital Stay: Payer: Medicare Other

## 2021-03-24 ENCOUNTER — Encounter: Payer: Self-pay | Admitting: Hematology

## 2021-03-24 ENCOUNTER — Other Ambulatory Visit: Payer: Self-pay

## 2021-03-24 VITALS — BP 161/53 | HR 65 | Temp 97.9°F | Resp 18 | Ht 60.0 in | Wt 158.6 lb

## 2021-03-24 DIAGNOSIS — K5730 Diverticulosis of large intestine without perforation or abscess without bleeding: Secondary | ICD-10-CM | POA: Diagnosis not present

## 2021-03-24 DIAGNOSIS — R1310 Dysphagia, unspecified: Secondary | ICD-10-CM | POA: Diagnosis not present

## 2021-03-24 DIAGNOSIS — R109 Unspecified abdominal pain: Secondary | ICD-10-CM | POA: Diagnosis not present

## 2021-03-24 DIAGNOSIS — Z886 Allergy status to analgesic agent status: Secondary | ICD-10-CM | POA: Diagnosis not present

## 2021-03-24 DIAGNOSIS — D631 Anemia in chronic kidney disease: Secondary | ICD-10-CM | POA: Diagnosis not present

## 2021-03-24 DIAGNOSIS — R634 Abnormal weight loss: Secondary | ICD-10-CM | POA: Diagnosis not present

## 2021-03-24 DIAGNOSIS — Z8719 Personal history of other diseases of the digestive system: Secondary | ICD-10-CM | POA: Diagnosis not present

## 2021-03-24 DIAGNOSIS — Z86718 Personal history of other venous thrombosis and embolism: Secondary | ICD-10-CM | POA: Diagnosis not present

## 2021-03-24 DIAGNOSIS — D638 Anemia in other chronic diseases classified elsewhere: Secondary | ICD-10-CM

## 2021-03-24 DIAGNOSIS — N183 Chronic kidney disease, stage 3 unspecified: Secondary | ICD-10-CM

## 2021-03-24 DIAGNOSIS — J45909 Unspecified asthma, uncomplicated: Secondary | ICD-10-CM | POA: Diagnosis not present

## 2021-03-24 DIAGNOSIS — Z7902 Long term (current) use of antithrombotics/antiplatelets: Secondary | ICD-10-CM | POA: Diagnosis not present

## 2021-03-24 DIAGNOSIS — I10 Essential (primary) hypertension: Secondary | ICD-10-CM

## 2021-03-24 DIAGNOSIS — K219 Gastro-esophageal reflux disease without esophagitis: Secondary | ICD-10-CM

## 2021-03-24 DIAGNOSIS — Z880 Allergy status to penicillin: Secondary | ICD-10-CM | POA: Diagnosis not present

## 2021-03-24 DIAGNOSIS — I129 Hypertensive chronic kidney disease with stage 1 through stage 4 chronic kidney disease, or unspecified chronic kidney disease: Secondary | ICD-10-CM | POA: Diagnosis not present

## 2021-03-24 LAB — CBC WITH DIFFERENTIAL/PLATELET
Abs Immature Granulocytes: 0.02 10*3/uL (ref 0.00–0.07)
Basophils Absolute: 0 10*3/uL (ref 0.0–0.1)
Basophils Relative: 1 %
Eosinophils Absolute: 0.1 10*3/uL (ref 0.0–0.5)
Eosinophils Relative: 3 %
HCT: 27.9 % — ABNORMAL LOW (ref 36.0–46.0)
Hemoglobin: 8.9 g/dL — ABNORMAL LOW (ref 12.0–15.0)
Immature Granulocytes: 1 %
Lymphocytes Relative: 23 %
Lymphs Abs: 1 10*3/uL (ref 0.7–4.0)
MCH: 28.8 pg (ref 26.0–34.0)
MCHC: 31.9 g/dL (ref 30.0–36.0)
MCV: 90.3 fL (ref 80.0–100.0)
Monocytes Absolute: 0.3 10*3/uL (ref 0.1–1.0)
Monocytes Relative: 8 %
Neutro Abs: 2.7 10*3/uL (ref 1.7–7.7)
Neutrophils Relative %: 64 %
Platelets: 191 10*3/uL (ref 150–400)
RBC: 3.09 MIL/uL — ABNORMAL LOW (ref 3.87–5.11)
RDW: 13.3 % (ref 11.5–15.5)
WBC: 4.2 10*3/uL (ref 4.0–10.5)
nRBC: 0 % (ref 0.0–0.2)

## 2021-03-24 LAB — COMPREHENSIVE METABOLIC PANEL
ALT: 6 U/L (ref 0–44)
AST: 15 U/L (ref 15–41)
Albumin: 3.4 g/dL — ABNORMAL LOW (ref 3.5–5.0)
Alkaline Phosphatase: 41 U/L (ref 38–126)
Anion gap: 10 (ref 5–15)
BUN: 31 mg/dL — ABNORMAL HIGH (ref 8–23)
CO2: 26 mmol/L (ref 22–32)
Calcium: 9 mg/dL (ref 8.9–10.3)
Chloride: 106 mmol/L (ref 98–111)
Creatinine, Ser: 2.35 mg/dL — ABNORMAL HIGH (ref 0.44–1.00)
GFR, Estimated: 20 mL/min — ABNORMAL LOW (ref >60)
Glucose, Bld: 105 mg/dL — ABNORMAL HIGH (ref 70–99)
Potassium: 4.3 mmol/L (ref 3.5–5.1)
Sodium: 142 mmol/L (ref 135–145)
Total Bilirubin: 0.5 mg/dL (ref 0.3–1.2)
Total Protein: 6.2 g/dL — ABNORMAL LOW (ref 6.5–8.1)

## 2021-03-24 MED ORDER — DARBEPOETIN ALFA 100 MCG/0.5ML IJ SOSY
100.0000 ug | PREFILLED_SYRINGE | Freq: Once | INTRAMUSCULAR | Status: AC
  Administered 2021-03-24: 100 ug via SUBCUTANEOUS
  Filled 2021-03-24: qty 0.5

## 2021-03-24 NOTE — Progress Notes (Signed)
Per Dr. Burr Medico, okay to proceed with Aranesp injection with blood pressure of 161/53.

## 2021-03-24 NOTE — Patient Instructions (Signed)
Darbepoetin Alfa injection ?What is this medication? ?DARBEPOETIN ALFA (dar be POE e tin  AL fa) helps your body make more red blood cells. It is used to treat anemia caused by chronic kidney failure and chemotherapy. ?This medicine may be used for other purposes; ask your health care provider or pharmacist if you have questions. ?COMMON BRAND NAME(S): Aranesp ?What should I tell my care team before I take this medication? ?They need to know if you have any of these conditions: ?blood clotting disorders or history of blood clots ?cancer patient not on chemotherapy ?cystic fibrosis ?heart disease, such as angina, heart failure, or a history of a heart attack ?hemoglobin level of 12 g/dL or greater ?high blood pressure ?low levels of folate, iron, or vitamin B12 ?seizures ?an unusual or allergic reaction to darbepoetin, erythropoietin, albumin, hamster proteins, latex, other medicines, foods, dyes, or preservatives ?pregnant or trying to get pregnant ?breast-feeding ?How should I use this medication? ?This medicine is for injection into a vein or under the skin. It is usually given by a health care professional in a hospital or clinic setting. ?If you get this medicine at home, you will be taught how to prepare and give this medicine. Use exactly as directed. Take your medicine at regular intervals. Do not take your medicine more often than directed. ?It is important that you put your used needles and syringes in a special sharps container. Do not put them in a trash can. If you do not have a sharps container, call your pharmacist or healthcare provider to get one. ?A special MedGuide will be given to you by the pharmacist with each prescription and refill. Be sure to read this information carefully each time. ?Talk to your pediatrician regarding the use of this medicine in children. While this medicine may be used in children as young as 1 month of age for selected conditions, precautions do apply. ?Overdosage: If  you think you have taken too much of this medicine contact a poison control center or emergency room at once. ?NOTE: This medicine is only for you. Do not share this medicine with others. ?What if I miss a dose? ?If you miss a dose, take it as soon as you can. If it is almost time for your next dose, take only that dose. Do not take double or extra doses. ?What may interact with this medication? ?Do not take this medicine with any of the following medications: ?epoetin alfa ?This list may not describe all possible interactions. Give your health care provider a list of all the medicines, herbs, non-prescription drugs, or dietary supplements you use. Also tell them if you smoke, drink alcohol, or use illegal drugs. Some items may interact with your medicine. ?What should I watch for while using this medication? ?Your condition will be monitored carefully while you are receiving this medicine. ?You may need blood work done while you are taking this medicine. ?This medicine may cause a decrease in vitamin B6. You should make sure that you get enough vitamin B6 while you are taking this medicine. Discuss the foods you eat and the vitamins you take with your health care professional. ?What side effects may I notice from receiving this medication? ?Side effects that you should report to your doctor or health care professional as soon as possible: ?allergic reactions like skin rash, itching or hives, swelling of the face, lips, or tongue ?breathing problems ?changes in vision ?chest pain ?confusion, trouble speaking or understanding ?feeling faint or lightheaded, falls ?high blood   pressure ?muscle aches or pains ?pain, swelling, warmth in the leg ?rapid weight gain ?severe headaches ?sudden numbness or weakness of the face, arm or leg ?trouble walking, dizziness, loss of balance or coordination ?seizures (convulsions) ?swelling of the ankles, feet, hands ?unusually weak or tired ?Side effects that usually do not require  medical attention (report to your doctor or health care professional if they continue or are bothersome): ?diarrhea ?fever, chills (flu-like symptoms) ?headaches ?nausea, vomiting ?redness, stinging, or swelling at site where injected ?This list may not describe all possible side effects. Call your doctor for medical advice about side effects. You may report side effects to FDA at 1-800-FDA-1088. ?Where should I keep my medication? ?Keep out of the reach of children. ?Store in a refrigerator between 2 and 8 degrees C (36 and 46 degrees F). Do not freeze. Do not shake. Throw away any unused portion if using a single-dose vial. Throw away any unused medicine after the expiration date. ?NOTE: This sheet is a summary. It may not cover all possible information. If you have questions about this medicine, talk to your doctor, pharmacist, or health care provider. ?? 2022 Elsevier/Gold Standard (2017-05-21 00:00:00) ? ?

## 2021-03-24 NOTE — Progress Notes (Signed)
[name]   [phone] [phone]   Clinic Follow up Note   Patient Care Team: Panosh, Standley Brooking, MD as PCP - General (Internal Medicine) Nahser, Wonda Cheng, MD as PCP - Cardiology (Cardiology) Marlou Sa, Tonna Corner, MD (Orthopedic Surgery) Truitt Merle, MD as Consulting Physician (Hematology) Madelon Lips, MD as Consulting Physician (Nephrology) Monna Fam, MD as Consulting Physician (Ophthalmology) Viona Gilmore, [facility] as Pharmacist (Pharmacist)  Date of Service:  03/24/2021  CHIEF COMPLAINT: f/u of anemia of CKD  CURRENT THERAPY:  Aranesp injections, starting 03/24/21  ASSESSMENT & PLAN:  [name] is a 87 y.o. female with   1. Anemia of chronic disease secondary to CKD.  -I last saw her in 06/2016, at which time her hgb was up to 10.4 and her ferritin and iron levels were normal. -Since her last visit, her hgb has been trending down, now in the 9 range. Repeated iron studies were all normal  -she is taking oral iron and vit B12. -no high suspicion for MDS  -Her anemia is likely secondary to CKD  - We discussed the benefit on the risks of Epo injection, especially the risk of thrombosis, including heart attack and stroke. She had a history of DVT in the past. I would consider starting Aranesp injection if her hemoglobin persistently below 9. -labs reviewed, hg 8.9 today (03/24/21). We will plan to start Aranesp today at 121mcg every 2 weeks.   2. Chronic Renal Disease, stage 3 -her creatinine has been in the 2 range over the last year. -Cr 3.25 today (03/24/21)   3. GERD/dysphagia, Gastric pain, weight loss -her most recent endoscopy from 04/2017 with Dr. Loletha Carrow showed only gastritis. -She has mild epigastric tenderness on exam -Stool OB was negative in June 2022 -CT AP 03/16/21 showed only colonic diverticulosis. -I referred her back to Dr. Loletha Carrow, but she did not answer when they reached out to her.   4. History of DVT.   -Patient was informed that patients with a history of DVT or more prone to DVT recurrence. she denies any symptoms of swelling in the legs and she reports mobilization. She also denies any acute shortness of breath.   5. Hypertension  -Continue low salt diet and anti-hypertensive medications per PCP.    PLAN: -proceed with aranesp today at 160mcg every 2 weeks  -referral back to Dr. Loletha Carrow -lab and aranesp injection in 2 and 4 weeks -f/u in 4 weeks   No problem-specific Assessment & Plan notes found for this encounter.   INTERVAL HISTORY:  [name] is here for a follow up of anemia. She was last seen by me on 02/24/21 in consultation. She presents to the clinic accompanied by her granddaughter. She reports doing well aside from being tired a lot. She states "I get real tired a lot." I asked how much energy she feels she have, such as out of 100%, and she just repeated this.    All other systems were reviewed with the patient and are negative.  MEDICAL HISTORY:  Past Medical History:  Diagnosis Date   Acute superficial venous thrombosis of left lower extremity 03/27/2014   concern about hx and potential extensive on exam tenderness calf and low dose lovenox 40 qd 2-4 weekselevetion and close  fu advised .    Allergy    Anemia    Anxiety    Arthritis    "shoulders" (09/09/2012)   Asthma 09/08/2012   Carotid bruit    [place]  2018  low risk 1 - 39%   Diabetes mellitus without complication ([place])    "Borderline" per pt; being monitored.  no meds per pt   Dyspnea 04/27/2014   GERD (gastroesophageal reflux disease)    Headache(784.0)    "related to my high blood pressure" (09/09/2012)   History of DVT (deep vein thrombosis)    "RLE" (09/09/2012) coumadine cant take asa so on plavix    HTN (hypertension) 09/08/2012   Hyperlipidemia    Lupus ([place])    "cured years ago" (09/09/2012)   Other dysphagia 02/11/2013   Syncope and collapse 01/21/2018   Varicose veins    Varicose veins of  leg with complications 0/93/2671    SURGICAL HISTORY: Past Surgical History:  Procedure Laterality Date   ABDOMINAL HYSTERECTOMY     partial   BREAST EXCISIONAL BIOPSY Right    BREAST SURGERY     CARPAL TUNNEL RELEASE Right 2000's   CARPAL TUNNEL RELEASE Bilateral 10/21/2013   Procedure: BILATERAL  CARPAL TUNNEL RELEASE;  Surgeon: [name], MD;  Location: [place];  Service: Orthopedics;  Laterality: Bilateral;   SHOULDER OPEN ROTATOR CUFF REPAIR Bilateral 2000's   TRIGGER FINGER RELEASE Right 10/21/2013   Procedure: RELEASE TRIGGER FINGER/A-1 PULLEY RIGHT MIDDLE AND RIGHT RING;  Surgeon: [name], MD;  Location: [place];  Service: Orthopedics;  Laterality: Right;    I have reviewed the social history and family history with the patient and they are unchanged from previous note.  ALLERGIES:  is allergic to penicillins and aspirin.  MEDICATIONS:  Current Outpatient Medications  Medication Sig Dispense Refill   albuterol (PROVENTIL) (2.5 MG/3ML) 0.083% nebulizer solution USE 1 AMPULE EVERY 6 HOURS AS NEEDED FOR WHEEZE 75 mL 1   albuterol (VENTOLIN HFA) 108 (90 Base) MCG/ACT inhaler USE 2 PUFFS EVERY 6 HOURS AS NEEDED FOR WHEEZING. 8.5 g 3   amLODipine (NORVASC) 5 MG tablet TAKE 1 TABLET BY MOUTH DAILY. 90 tablet 3   atorvastatin (LIPITOR) 20 MG tablet TAKE ONE TABLET BY MOUTH DAILY. 90 tablet 1   Azelastine-Fluticasone (DYMISTA) 137-50 MCG/ACT SUSP Place 1 spray into the nose in the morning and at bedtime. 23 g 6   Budeson-Glycopyrrol-Formoterol (BREZTRI AEROSPHERE) 160-9-4.8 MCG/ACT AERO INHALE 2 PUFFS INTO THE LUNGS TWICE DAILY, IN THE MORNING & AT BEDTIME. 10.7 g 6   carvedilol (COREG) 6.25 MG tablet Take 6.25 mg by mouth daily.     citalopram (CELEXA) 10 MG tablet TAKE 1 TABLET ONCE DAILY. 90 tablet 1   clopidogrel (PLAVIX) 75 MG tablet TAKE 1 TABLET ONCE DAILY. 30 tablet 1   diclofenac Sodium (VOLTAREN) 1 % GEL Apply 4 g topically 4 (four)  times daily. 100 g 6   doxazosin (CARDURA) 2 MG tablet TAKE THREE TABLETS BY MOUTH AT BEDTIME 270 tablet 1   ferrous sulfate 325 (65 FE) MG EC tablet Take 1 tablet (325 mg total) by mouth daily with breakfast. 30 tablet 3   fluorometholone (FML) 0.1 % ophthalmic suspension Place 1 drop into both eyes 2 (two) times daily.      fluticasone (FLONASE) 50 MCG/ACT nasal spray Place 2 sprays into both nostrils daily. 16 g 2   furosemide (LASIX) 40 MG tablet Take by mouth. Take 80 mg twice for 2-3 days, then decrease to 80 mg in the am and 40 mg in the pm     hydrALAZINE (APRESOLINE) 50 MG tablet Take 1.5 tablets (75 mg total) by mouth 3 (three) times  daily. 405 tablet 3   HYDROcodone-acetaminophen (NORCO/VICODIN) 5-325 MG tablet Take 1 tablet by mouth daily as needed for moderate pain. (Patient not taking: Reported on 03/21/2021) 15 tablet 0   loratadine (CLARITIN) 10 MG tablet Take 1 tablet (10 mg total) by mouth daily. 30 tablet 11   LORazepam (ATIVAN) 1 MG tablet TAKE 1 TABLET EVERY 8 HOURS AS NEEDED FOR ANXIETY. AVOID REGULAR USE. (Patient not taking: Reported on 03/21/2021) 24 tablet 0   montelukast (SINGULAIR) 10 MG tablet TAKE 1 TABLET ONCE DAILY. 90 tablet 1   pantoprazole (PROTONIX) 40 MG tablet Take 1 tablet (40 mg total) by mouth 2 (two) times daily. 180 tablet 1   potassium chloride (KLOR-CON) 10 MEQ tablet TAKE FOUR TABLETS BY MOUTH DAILY. 120 tablet 3   prednisoLONE acetate (PRED FORTE) 1 % ophthalmic suspension 1 drop 2 (two) times daily.     Spacer/Aero-Holding Chambers (AEROCHAMBER PLUS) inhaler Use as instructed 1 each 2   vitamin B-12 (CYANOCOBALAMIN) 1000 MCG tablet Take 1,000 mcg by mouth daily.     Vitamin D, Cholecalciferol, 25 MCG (1000 UT) TABS Take 1,000 Units by mouth daily.     No current facility-administered medications for this visit.    PHYSICAL EXAMINATION: ECOG PERFORMANCE STATUS: 1 - Symptomatic but completely ambulatory  Vitals:   03/24/21 1341  BP: (!) 161/53   Pulse: 65  Resp: 18  Temp: 97.9 F (36.6 C)  SpO2: 99%   Wt Readings from Last 3 Encounters:  03/24/21 158 lb 9.6 oz (71.9 kg)  03/21/21 158 lb 6.4 oz (71.8 kg)  02/24/21 161 lb 6.4 oz (73.2 kg)     GENERAL:alert, no distress and comfortable SKIN: skin color normal, no rashes or significant lesions EYES: normal, Conjunctiva are pink and non-injected, sclera clear  NEURO: alert & oriented x 3 with fluent speech  LABORATORY DATA:  I have reviewed the data as listed CBC Latest Ref Rng & Units 03/24/2021 03/16/2021 01/27/2021  WBC 4.0 - 10.5 K/uL 4.2 4.3 4.4  Hemoglobin 12.0 - 15.0 g/dL 8.9(L) 9.6(L) 9.1(L)  Hematocrit 36.0 - 46.0 % 27.9(L) 30.2(L) 27.9(L)  Platelets 150 - 400 K/uL 191 203 170.0     CMP Latest Ref Rng & Units 03/24/2021 03/16/2021 01/27/2021  Glucose 70 - 99 mg/dL 105(H) 116(H) 108(H)  BUN 8 - 23 mg/dL 31(H) 41(H) 40(H)  Creatinine 0.44 - 1.00 mg/dL 2.35(H) 2.26(H) 2.64(H)  Sodium 135 - 145 mmol/L 142 143 143  Potassium 3.5 - 5.1 mmol/L 4.3 3.9 3.6  Chloride 98 - 111 mmol/L 106 105 105  CO2 22 - 32 mmol/L 26 25 27   Calcium 8.9 - 10.3 mg/dL 9.0 9.3 9.3  Total Protein 6.5 - 8.1 g/dL 6.2(L) 6.9 -  Total Bilirubin 0.3 - 1.2 mg/dL 0.5 0.4 -  Alkaline Phos 38 - 126 U/L 41 44 -  AST 15 - 41 U/L 15 18 -  ALT 0 - 44 U/L 6 7 -      RADIOGRAPHIC STUDIES: I have personally reviewed the radiological images as listed and agreed with the findings in the report. No results found.    No orders of the defined types were placed in this encounter.  All questions were answered. The patient knows to call the clinic with any problems, questions or concerns. No barriers to learning was detected. The total time spent in the appointment was 30 minutes.     Truitt Merle, MD 03/24/2021   I, Wilburn Mylar, am acting as scribe for Truitt Merle,  MD.   I have reviewed the above documentation for accuracy and completeness, and I agree with the above.

## 2021-03-29 DIAGNOSIS — H35033 Hypertensive retinopathy, bilateral: Secondary | ICD-10-CM | POA: Diagnosis not present

## 2021-03-29 DIAGNOSIS — H44113 Panuveitis, bilateral: Secondary | ICD-10-CM | POA: Diagnosis not present

## 2021-03-29 DIAGNOSIS — Z79899 Other long term (current) drug therapy: Secondary | ICD-10-CM | POA: Diagnosis not present

## 2021-04-05 ENCOUNTER — Encounter: Payer: Self-pay | Admitting: Gastroenterology

## 2021-04-05 ENCOUNTER — Telehealth: Payer: Self-pay | Admitting: Hematology

## 2021-04-05 ENCOUNTER — Telehealth: Payer: Self-pay | Admitting: Internal Medicine

## 2021-04-05 NOTE — Telephone Encounter (Signed)
Scheduled per sch msg. Called and left msg  

## 2021-04-05 NOTE — Telephone Encounter (Signed)
Tried calling patient to schedule Medicare Annual Wellness Visit (AWV) either virtually or in office.   No answer   AWV-I per [place] 05/15/09  please schedule at anytime with [facility] Nurse Health Advisor 1 or 2   This should be a 45 minute visit.  

## 2021-04-08 ENCOUNTER — Inpatient Hospital Stay: Payer: Medicare Other

## 2021-04-08 ENCOUNTER — Other Ambulatory Visit: Payer: Self-pay

## 2021-04-08 VITALS — BP 154/60 | HR 61 | Temp 98.4°F | Resp 17

## 2021-04-08 DIAGNOSIS — K219 Gastro-esophageal reflux disease without esophagitis: Secondary | ICD-10-CM | POA: Diagnosis not present

## 2021-04-08 DIAGNOSIS — D638 Anemia in other chronic diseases classified elsewhere: Secondary | ICD-10-CM

## 2021-04-08 DIAGNOSIS — N183 Chronic kidney disease, stage 3 unspecified: Secondary | ICD-10-CM

## 2021-04-08 DIAGNOSIS — I129 Hypertensive chronic kidney disease with stage 1 through stage 4 chronic kidney disease, or unspecified chronic kidney disease: Secondary | ICD-10-CM | POA: Diagnosis not present

## 2021-04-08 DIAGNOSIS — D631 Anemia in chronic kidney disease: Secondary | ICD-10-CM | POA: Diagnosis not present

## 2021-04-08 DIAGNOSIS — Z86718 Personal history of other venous thrombosis and embolism: Secondary | ICD-10-CM | POA: Diagnosis not present

## 2021-04-08 DIAGNOSIS — J45909 Unspecified asthma, uncomplicated: Secondary | ICD-10-CM | POA: Diagnosis not present

## 2021-04-08 DIAGNOSIS — Z8719 Personal history of other diseases of the digestive system: Secondary | ICD-10-CM | POA: Diagnosis not present

## 2021-04-08 DIAGNOSIS — R634 Abnormal weight loss: Secondary | ICD-10-CM | POA: Diagnosis not present

## 2021-04-08 DIAGNOSIS — R1310 Dysphagia, unspecified: Secondary | ICD-10-CM | POA: Diagnosis not present

## 2021-04-08 DIAGNOSIS — K5730 Diverticulosis of large intestine without perforation or abscess without bleeding: Secondary | ICD-10-CM | POA: Diagnosis not present

## 2021-04-08 DIAGNOSIS — Z7902 Long term (current) use of antithrombotics/antiplatelets: Secondary | ICD-10-CM | POA: Diagnosis not present

## 2021-04-08 DIAGNOSIS — Z880 Allergy status to penicillin: Secondary | ICD-10-CM | POA: Diagnosis not present

## 2021-04-08 DIAGNOSIS — Z886 Allergy status to analgesic agent status: Secondary | ICD-10-CM | POA: Diagnosis not present

## 2021-04-08 DIAGNOSIS — R109 Unspecified abdominal pain: Secondary | ICD-10-CM | POA: Diagnosis not present

## 2021-04-08 LAB — COMPREHENSIVE METABOLIC PANEL
ALT: 8 U/L (ref 0–44)
AST: 19 U/L (ref 15–41)
Albumin: 3.4 g/dL — ABNORMAL LOW (ref 3.5–5.0)
Alkaline Phosphatase: 47 U/L (ref 38–126)
Anion gap: 12 (ref 5–15)
BUN: 30 mg/dL — ABNORMAL HIGH (ref 8–23)
CO2: 25 mmol/L (ref 22–32)
Calcium: 8.9 mg/dL (ref 8.9–10.3)
Chloride: 105 mmol/L (ref 98–111)
Creatinine, Ser: 2.29 mg/dL — ABNORMAL HIGH (ref 0.44–1.00)
GFR, Estimated: 20 mL/min — ABNORMAL LOW (ref >60)
Glucose, Bld: 106 mg/dL — ABNORMAL HIGH (ref 70–99)
Potassium: 4.2 mmol/L (ref 3.5–5.1)
Sodium: 142 mmol/L (ref 135–145)
Total Bilirubin: 0.5 mg/dL (ref 0.3–1.2)
Total Protein: 6.5 g/dL (ref 6.5–8.1)

## 2021-04-08 LAB — CBC WITH DIFFERENTIAL/PLATELET
Abs Immature Granulocytes: 0.03 10*3/uL (ref 0.00–0.07)
Basophils Absolute: 0 10*3/uL (ref 0.0–0.1)
Basophils Relative: 0 %
Eosinophils Absolute: 0.2 10*3/uL (ref 0.0–0.5)
Eosinophils Relative: 3 %
HCT: 31.6 % — ABNORMAL LOW (ref 36.0–46.0)
Hemoglobin: 9.8 g/dL — ABNORMAL LOW (ref 12.0–15.0)
Immature Granulocytes: 1 %
Lymphocytes Relative: 29 %
Lymphs Abs: 1.4 10*3/uL (ref 0.7–4.0)
MCH: 28.7 pg (ref 26.0–34.0)
MCHC: 31 g/dL (ref 30.0–36.0)
MCV: 92.4 fL (ref 80.0–100.0)
Monocytes Absolute: 0.3 10*3/uL (ref 0.1–1.0)
Monocytes Relative: 7 %
Neutro Abs: 2.8 10*3/uL (ref 1.7–7.7)
Neutrophils Relative %: 60 %
Platelets: 186 10*3/uL (ref 150–400)
RBC: 3.42 MIL/uL — ABNORMAL LOW (ref 3.87–5.11)
RDW: 14.7 % (ref 11.5–15.5)
WBC: 4.7 10*3/uL (ref 4.0–10.5)
nRBC: 0 % (ref 0.0–0.2)

## 2021-04-08 MED ORDER — DARBEPOETIN ALFA 100 MCG/0.5ML IJ SOSY
100.0000 ug | PREFILLED_SYRINGE | Freq: Once | INTRAMUSCULAR | Status: AC
  Administered 2021-04-08: 100 ug via SUBCUTANEOUS
  Filled 2021-04-08: qty 0.5

## 2021-04-18 ENCOUNTER — Other Ambulatory Visit: Payer: Self-pay | Admitting: Internal Medicine

## 2021-04-19 ENCOUNTER — Telehealth: Payer: Self-pay | Admitting: Pharmacist

## 2021-04-19 NOTE — Chronic Care Management (AMB) (Signed)
Chronic Care Management Pharmacy Assistant   Name: [name]  MRN: [id number] DOB: [birth year]  Reason for Encounter: Disease State / Hypertension Assessment Call   Conditions to be addressed/monitored: HTN  Recent office visits:  None  Recent consult visits:  03/29/2021 Dwana Melena MD (opthamology) - Patient was seen for retina evaluation. Discontinued Imuran 50mg . No follow up noted.  03/24/2021 Truitt Merle MD (oncology) - Patient was seen for anemia of chronic disease. No medication changes. Follow up in 4 weeks.  Hospital visits:  None  Medications: Outpatient Encounter Medications as of 04/19/2021  Medication Sig   albuterol (PROVENTIL) (2.5 MG/3ML) 0.083% nebulizer solution USE 1 AMPULE EVERY 6 HOURS AS NEEDED FOR WHEEZE   albuterol (VENTOLIN HFA) 108 (90 Base) MCG/ACT inhaler USE 2 PUFFS EVERY 6 HOURS AS NEEDED FOR WHEEZING.   amLODipine (NORVASC) 5 MG tablet TAKE 1 TABLET BY MOUTH DAILY.   atorvastatin (LIPITOR) 20 MG tablet TAKE ONE TABLET BY MOUTH DAILY.   Azelastine-Fluticasone (DYMISTA) 137-50 MCG/ACT SUSP Place 1 spray into the nose in the morning and at bedtime.   Budeson-Glycopyrrol-Formoterol (BREZTRI AEROSPHERE) 160-9-4.8 MCG/ACT AERO INHALE 2 PUFFS INTO THE LUNGS TWICE DAILY, IN THE MORNING & AT BEDTIME.   carvedilol (COREG) 6.25 MG tablet Take 6.25 mg by mouth daily.   citalopram (CELEXA) 10 MG tablet TAKE 1 TABLET ONCE DAILY.   clopidogrel (PLAVIX) 75 MG tablet TAKE 1 TABLET ONCE DAILY.   diclofenac Sodium (VOLTAREN) 1 % GEL Apply 4 g topically 4 (four) times daily.   doxazosin (CARDURA) 2 MG tablet TAKE THREE TABLETS BY MOUTH AT BEDTIME   ferrous sulfate 325 (65 FE) MG EC tablet Take 1 tablet (325 mg total) by mouth daily with breakfast.   fluorometholone (FML) 0.1 % ophthalmic suspension Place 1 drop into both eyes 2 (two) times daily.    fluticasone (FLONASE) 50 MCG/ACT nasal spray Place 2 sprays into both nostrils daily.   furosemide (LASIX) 40 MG  tablet Take by mouth. Take 80 mg twice for 2-3 days, then decrease to 80 mg in the am and 40 mg in the pm   hydrALAZINE (APRESOLINE) 50 MG tablet Take 1.5 tablets (75 mg total) by mouth 3 (three) times daily.   HYDROcodone-acetaminophen (NORCO/VICODIN) 5-325 MG tablet Take 1 tablet by mouth daily as needed for moderate pain. (Patient not taking: Reported on 03/21/2021)   loratadine (CLARITIN) 10 MG tablet Take 1 tablet (10 mg total) by mouth daily.   LORazepam (ATIVAN) 1 MG tablet TAKE 1 TABLET EVERY 8 HOURS AS NEEDED FOR ANXIETY. AVOID REGULAR USE. (Patient not taking: Reported on 03/21/2021)   montelukast (SINGULAIR) 10 MG tablet TAKE 1 TABLET ONCE DAILY.   pantoprazole (PROTONIX) 40 MG tablet Take 1 tablet (40 mg total) by mouth 2 (two) times daily.   potassium chloride (KLOR-CON) 10 MEQ tablet TAKE FOUR TABLETS BY MOUTH DAILY.   prednisoLONE acetate (PRED FORTE) 1 % ophthalmic suspension 1 drop 2 (two) times daily.   Spacer/Aero-Holding Chambers (AEROCHAMBER PLUS) inhaler Use as instructed   vitamin B-12 (CYANOCOBALAMIN) 1000 MCG tablet Take 1,000 mcg by mouth daily.   Vitamin D, Cholecalciferol, 25 MCG (1000 UT) TABS Take 1,000 Units by mouth daily.   No facility-administered encounter medications on file as of 04/19/2021.  Fill History: albuterol sulfate HFA 90 mcg/actuation aerosol inhaler 04/09/2021 25   atorvastatin 20 mg tablet 03/22/2021 90   Breztri Aerosphere 160 mcg-28mcg-4.8mcg/actuation HFA aerosol inhaler 04/11/2021 30   carvedilol 6.25 mg tablet 04/11/2021 90  citalopram 10 mg tablet 02/09/2021 90   clopidogrel 75 mg tablet 03/14/2021 30   doxazosin 2 mg tablet 03/11/2021 30   furosemide 40 mg tablet 02/09/2021 90   montelukast 10 mg tablet 02/04/2021 90   pantoprazole 40 mg tablet,delayed release 02/18/2021 90   potassium chloride ER 10 mEq tablet,extended release 04/09/2021 30   amlodipine 5 mg tablet 04/09/2021 90   azathioprine 50 mg tablet 04/09/2021 60    hydralazine 50 mg tablet 04/11/2021 30   Reviewed chart prior to disease state call. Spoke with patient regarding BP  Recent Office Vitals: BP Readings from Last 3 Encounters:  04/08/21 (!) 154/60  03/24/21 (!) 161/53  03/21/21 (!) 162/62   Pulse Readings from Last 3 Encounters:  04/08/21 61  03/24/21 65  03/21/21 71    Wt Readings from Last 3 Encounters:  03/24/21 158 lb 9.6 oz (71.9 kg)  03/21/21 158 lb 6.4 oz (71.8 kg)  02/24/21 161 lb 6.4 oz (73.2 kg)     Kidney Function Lab Results  Component Value Date/Time   CREATININE 2.29 (H) 04/08/2021 01:53 PM   CREATININE 2.35 (H) 03/24/2021 01:18 PM   CREATININE 2.31 (H) 03/10/2020 11:49 AM   CREATININE 1.4 (H) 06/19/2016 01:01 PM   CREATININE 1.6 (H) 08/16/2015 11:36 AM   GFR 16.08 (L) 01/27/2021 11:57 AM   GFRNONAA 20 (L) 04/08/2021 01:53 PM   GFRNONAA 19 (L) 03/10/2020 11:49 AM   GFRAA 19 11/04/2020 12:00 AM   GFRAA 22 (L) 03/10/2020 11:49 AM    BMP Latest Ref Rng & Units 04/08/2021 03/24/2021 03/16/2021  Glucose 70 - 99 mg/dL 106(H) 105(H) 116(H)  BUN 8 - 23 mg/dL 30(H) 31(H) 41(H)  Creatinine 0.44 - 1.00 mg/dL 2.29(H) 2.35(H) 2.26(H)  BUN/Creat Ratio 6 - 22 (calc) - - -  Sodium 135 - 145 mmol/L 142 142 143  Potassium 3.5 - 5.1 mmol/L 4.2 4.3 3.9  Chloride 98 - 111 mmol/L 105 106 105  CO2 22 - 32 mmol/L 25 26 25   Calcium 8.9 - 10.3 mg/dL 8.9 9.0 9.3    Current antihypertensive regimen:  Amlodipine 5 mg daily Carvedilol 6.25 mg daily Doxazosin 2 mg 3 tablets daily   How often are you checking your Blood Pressure?   Current home BP readings:   What recent interventions/DTPs have been made by any provider to improve Blood Pressure control since last CPP Visit: None  Any recent hospitalizations or ED visits since last visit with CPP? No  What diet changes have been made to improve Blood Pressure Control?    What exercise is being done to improve your Blood Pressure Control?    Adherence Review: Is  the patient currently on ACE/ARB medication? No Does the patient have >5 day gap between last estimated fill dates? No  Unable to reach after several attempts.  Care Gaps: AWV - no show on 12/27/20. To be rescheduled. Last BP - 161/53 on 03/24/2021 Foot exam - never done Dexa scan - never done Covid-19 vaccine - overdue   Star Rating Drugs: Atorvastatin 20mg  - last filled on 03/22/2021 90DS at [facility] Pharmacist Assistant [phone]

## 2021-04-20 ENCOUNTER — Telehealth: Payer: Self-pay | Admitting: Internal Medicine

## 2021-04-20 NOTE — Telephone Encounter (Signed)
Left message for patient to call back and schedule Medicare Annual Wellness Visit (AWV) either virtually or in office. Left  my jabber number [phone] ° ° °AWV-I per [place] 05/15/09 °please schedule at anytime with [facility] Nurse Health Advisor 1 or 2 ° ° °This should be a 45 minute visit.  °

## 2021-04-20 NOTE — Telephone Encounter (Signed)
[name] called because patient told them that she was taking two of the citalopram (CELEXA) 10 MG tablet per day instead of the original directions of one. Pharmacy states she ran out faster and they want doctors clarification on whether it is okay for her to continue two a day (they will need a new prescription sent) or if she should go back to original instructions of one a day (they will still need refill sent in)  I did let them know that Dr.Panosh was out until next Thursday   Please send to  [facility], [place] Phone:  [phone]  Fax:  [phone]      Good callback number is [phone]     Please advise

## 2021-04-21 MED ORDER — CITALOPRAM HYDROBROMIDE 20 MG PO TABS: 20.0000 mg | ORAL_TABLET | Freq: Every day | ORAL | 0 refills | Status: DC

## 2021-04-21 NOTE — Telephone Encounter (Signed)
Rx sent 

## 2021-04-21 NOTE — Telephone Encounter (Signed)
Please change the RX to Celexa 20 mg to take one tablet per day. Call in #[phone] with no rf

## 2021-04-25 ENCOUNTER — Other Ambulatory Visit: Payer: Self-pay

## 2021-04-25 ENCOUNTER — Inpatient Hospital Stay: Payer: Medicare Other | Attending: Hematology | Admitting: Hematology

## 2021-04-25 ENCOUNTER — Inpatient Hospital Stay: Payer: Medicare Other

## 2021-04-25 VITALS — BP 148/59 | HR 62 | Temp 98.0°F | Resp 18 | Ht 60.0 in | Wt 156.3 lb

## 2021-04-25 DIAGNOSIS — K5730 Diverticulosis of large intestine without perforation or abscess without bleeding: Secondary | ICD-10-CM | POA: Insufficient documentation

## 2021-04-25 DIAGNOSIS — M3214 Glomerular disease in systemic lupus erythematosus: Secondary | ICD-10-CM | POA: Diagnosis not present

## 2021-04-25 DIAGNOSIS — D631 Anemia in chronic kidney disease: Secondary | ICD-10-CM | POA: Diagnosis not present

## 2021-04-25 DIAGNOSIS — E1122 Type 2 diabetes mellitus with diabetic chronic kidney disease: Secondary | ICD-10-CM | POA: Insufficient documentation

## 2021-04-25 DIAGNOSIS — N183 Chronic kidney disease, stage 3 unspecified: Secondary | ICD-10-CM

## 2021-04-25 DIAGNOSIS — Z86718 Personal history of other venous thrombosis and embolism: Secondary | ICD-10-CM | POA: Diagnosis not present

## 2021-04-25 DIAGNOSIS — D638 Anemia in other chronic diseases classified elsewhere: Secondary | ICD-10-CM

## 2021-04-25 DIAGNOSIS — Z880 Allergy status to penicillin: Secondary | ICD-10-CM | POA: Diagnosis not present

## 2021-04-25 DIAGNOSIS — Z7902 Long term (current) use of antithrombotics/antiplatelets: Secondary | ICD-10-CM | POA: Insufficient documentation

## 2021-04-25 DIAGNOSIS — R1310 Dysphagia, unspecified: Secondary | ICD-10-CM | POA: Insufficient documentation

## 2021-04-25 DIAGNOSIS — Z79899 Other long term (current) drug therapy: Secondary | ICD-10-CM | POA: Diagnosis not present

## 2021-04-25 DIAGNOSIS — Z886 Allergy status to analgesic agent status: Secondary | ICD-10-CM | POA: Diagnosis not present

## 2021-04-25 DIAGNOSIS — I129 Hypertensive chronic kidney disease with stage 1 through stage 4 chronic kidney disease, or unspecified chronic kidney disease: Secondary | ICD-10-CM | POA: Diagnosis not present

## 2021-04-25 DIAGNOSIS — K219 Gastro-esophageal reflux disease without esophagitis: Secondary | ICD-10-CM | POA: Insufficient documentation

## 2021-04-25 DIAGNOSIS — R634 Abnormal weight loss: Secondary | ICD-10-CM | POA: Insufficient documentation

## 2021-04-25 DIAGNOSIS — Z8719 Personal history of other diseases of the digestive system: Secondary | ICD-10-CM | POA: Diagnosis not present

## 2021-04-25 LAB — COMPREHENSIVE METABOLIC PANEL
ALT: 6 U/L (ref 0–44)
AST: 17 U/L (ref 15–41)
Albumin: 3.4 g/dL — ABNORMAL LOW (ref 3.5–5.0)
Alkaline Phosphatase: 40 U/L (ref 38–126)
Anion gap: 10 (ref 5–15)
BUN: 31 mg/dL — ABNORMAL HIGH (ref 8–23)
CO2: 24 mmol/L (ref 22–32)
Calcium: 8.8 mg/dL — ABNORMAL LOW (ref 8.9–10.3)
Chloride: 107 mmol/L (ref 98–111)
Creatinine, Ser: 2.37 mg/dL — ABNORMAL HIGH (ref 0.44–1.00)
GFR, Estimated: 20 mL/min — ABNORMAL LOW (ref >60)
Glucose, Bld: 109 mg/dL — ABNORMAL HIGH (ref 70–99)
Potassium: 4 mmol/L (ref 3.5–5.1)
Sodium: 141 mmol/L (ref 135–145)
Total Bilirubin: 0.6 mg/dL (ref 0.3–1.2)
Total Protein: 6.3 g/dL — ABNORMAL LOW (ref 6.5–8.1)

## 2021-04-25 LAB — CBC WITH DIFFERENTIAL/PLATELET
Abs Immature Granulocytes: 0.03 10*3/uL (ref 0.00–0.07)
Basophils Absolute: 0 10*3/uL (ref 0.0–0.1)
Basophils Relative: 1 %
Eosinophils Absolute: 0.1 10*3/uL (ref 0.0–0.5)
Eosinophils Relative: 5 %
HCT: 33.6 % — ABNORMAL LOW (ref 36.0–46.0)
Hemoglobin: 10.8 g/dL — ABNORMAL LOW (ref 12.0–15.0)
Immature Granulocytes: 1 %
Lymphocytes Relative: 28 %
Lymphs Abs: 0.8 10*3/uL (ref 0.7–4.0)
MCH: 29.3 pg (ref 26.0–34.0)
MCHC: 32.1 g/dL (ref 30.0–36.0)
MCV: 91.1 fL (ref 80.0–100.0)
Monocytes Absolute: 0.3 10*3/uL (ref 0.1–1.0)
Monocytes Relative: 9 %
Neutro Abs: 1.7 10*3/uL (ref 1.7–7.7)
Neutrophils Relative %: 56 %
Platelets: 190 10*3/uL (ref 150–400)
RBC: 3.69 MIL/uL — ABNORMAL LOW (ref 3.87–5.11)
RDW: 14.3 % (ref 11.5–15.5)
WBC: 3 10*3/uL — ABNORMAL LOW (ref 4.0–10.5)
nRBC: 0 % (ref 0.0–0.2)

## 2021-04-25 MED ORDER — DARBEPOETIN ALFA 100 MCG/0.5ML IJ SOSY
100.0000 ug | PREFILLED_SYRINGE | Freq: Once | INTRAMUSCULAR | Status: AC
  Administered 2021-04-25: 100 ug via SUBCUTANEOUS
  Filled 2021-04-25: qty 0.5

## 2021-04-25 NOTE — Progress Notes (Signed)
[facility]   [phone] [phone]   Clinic Follow up Note   Patient Care Team: Panosh, Standley Brooking, MD as PCP - General (Internal Medicine) Nahser, Wonda Cheng, MD as PCP - Cardiology (Cardiology) Marlou Sa, Tonna Corner, MD (Orthopedic Surgery) Truitt Merle, MD as Consulting Physician (Hematology) Madelon Lips, MD as Consulting Physician (Nephrology) Monna Fam, MD as Consulting Physician (Ophthalmology) Viona Gilmore, [facility] as Pharmacist (Pharmacist)  Date of Service:  04/25/2021  CHIEF COMPLAINT: f/u of anemia of CKD  CURRENT THERAPY:  Aranesp injections, 17mcg q2weeks, starting 03/24/21, changed to q3weeks 04/25/21  ASSESSMENT & PLAN:  [name] is a 87 y.o. female with   1. Anemia of chronic disease secondary to CKD.  -I last saw her in 06/2016, at which time her hgb was up to 10.4 and her ferritin and iron levels were normal. -Since her last visit, her hgb has been trending down, now in the 9 range. Repeated iron studies were all normal  -she is taking oral iron and vit B12. -no high suspicion for MDS  -Her anemia is likely secondary to CKD  -We discussed the benefit on the risks of Epo injection, especially the risk of thrombosis, including heart attack and stroke. She had a history of DVT in the past. I started her on Aranesp q2weeks injections on 03/24/21. -labs reviewed, her hgb is up to 10.8 today,will proceed injection today. Given her good response, we will spread out her injections to every 3 weeks.   2. Chronic Renal Disease, stage 3 -her creatinine has been in the 2 range over the last year. -Cr stable at 2.37 today (04/25/21)   3. GERD/dysphagia, Gastric pain, weight loss -her most recent endoscopy from 04/2017 with Dr. Loletha Carrow showed only gastritis. -She has mild epigastric tenderness on exam -Stool OB was negative in June 2022 -CT AP 03/16/21 showed only colonic diverticulosis. -I previously referred her back to  Dr. Loletha Carrow, but she did not answer when they reached out to her.   4. H/o DVT -Patient was informed that patients with a history of DVT or more prone to DVT recurrence. she denies any symptoms of swelling in the legs and she reports mobilization. She also denies any acute shortness of breath.   5. Hypertension  -Continue low salt diet and anti-hypertensive medications per PCP.      PLAN: -proceed with aranesp today at 13mcg and change to every 3 weeks  -lab and aranesp injection every 3 weeks x4 -f/u in 12 weeks   No problem-specific Assessment & Plan notes found for this encounter.   INTERVAL HISTORY:  [name] is here for a follow up of anemia. She was last seen by me on 03/24/21. She presents to the clinic accompanied by her daughter.   All other systems were reviewed with the patient and are negative.  MEDICAL HISTORY:  Past Medical History:  Diagnosis Date   Acute superficial venous thrombosis of left lower extremity 03/27/2014   concern about hx and potential extensive on exam tenderness calf and low dose lovenox 40 qd 2-4 weekselevetion and close  fu advised .    Allergy    Anemia    Anxiety    Arthritis    "shoulders" (09/09/2012)   Asthma 09/08/2012   Carotid bruit    [place] 2018  low risk 1 - 39%   Diabetes mellitus without complication ([place])    "Borderline" per pt; being monitored.  no meds per pt   Dyspnea  04/27/2014   GERD (gastroesophageal reflux disease)    Headache(784.0)    "related to my high blood pressure" (09/09/2012)   History of DVT (deep vein thrombosis)    "RLE" (09/09/2012) coumadine cant take asa so on plavix    HTN (hypertension) 09/08/2012   Hyperlipidemia    Lupus ([place])    "cured years ago" (09/09/2012)   Other dysphagia 02/11/2013   Syncope and collapse 01/21/2018   Varicose veins    Varicose veins of leg with complications 0/93/8182    SURGICAL HISTORY: Past Surgical History:  Procedure Laterality Date   ABDOMINAL HYSTERECTOMY      partial   BREAST EXCISIONAL BIOPSY Right    BREAST SURGERY     CARPAL TUNNEL RELEASE Right 2000's   CARPAL TUNNEL RELEASE Bilateral 10/21/2013   Procedure: BILATERAL  CARPAL TUNNEL RELEASE;  Surgeon: [name], MD;  Location: [facility];  Service: Orthopedics;  Laterality: Bilateral;   SHOULDER OPEN ROTATOR CUFF REPAIR Bilateral 2000's   TRIGGER FINGER RELEASE Right 10/21/2013   Procedure: RELEASE TRIGGER FINGER/A-1 PULLEY RIGHT MIDDLE AND RIGHT RING;  Surgeon: [name], MD;  Location: [facility];  Service: Orthopedics;  Laterality: Right;    I have reviewed the social history and family history with the patient and they are unchanged from previous note.  ALLERGIES:  is allergic to penicillins and aspirin.  MEDICATIONS:  Current Outpatient Medications  Medication Sig Dispense Refill   albuterol (PROVENTIL) (2.5 MG/3ML) 0.083% nebulizer solution USE 1 AMPULE EVERY 6 HOURS AS NEEDED FOR WHEEZE 75 mL 1   albuterol (VENTOLIN HFA) 108 (90 Base) MCG/ACT inhaler USE 2 PUFFS EVERY 6 HOURS AS NEEDED FOR WHEEZING. 8.5 g 3   amLODipine (NORVASC) 5 MG tablet TAKE 1 TABLET BY MOUTH DAILY. 90 tablet 3   atorvastatin (LIPITOR) 20 MG tablet TAKE ONE TABLET BY MOUTH DAILY. 90 tablet 1   Azelastine-Fluticasone (DYMISTA) 137-50 MCG/ACT SUSP Place 1 spray into the nose in the morning and at bedtime. 23 g 6   Budeson-Glycopyrrol-Formoterol (BREZTRI AEROSPHERE) 160-9-4.8 MCG/ACT AERO INHALE 2 PUFFS INTO THE LUNGS TWICE DAILY, IN THE MORNING & AT BEDTIME. 10.7 g 6   carvedilol (COREG) 6.25 MG tablet Take 6.25 mg by mouth daily.     citalopram (CELEXA) 20 MG tablet Take 1 tablet (20 mg total) by mouth daily. 30 tablet 0   clopidogrel (PLAVIX) 75 MG tablet TAKE 1 TABLET ONCE DAILY. 30 tablet 1   diclofenac Sodium (VOLTAREN) 1 % GEL Apply 4 g topically 4 (four) times daily. 100 g 6   doxazosin (CARDURA) 2 MG tablet TAKE THREE TABLETS BY MOUTH AT BEDTIME 270 tablet 1   ferrous  sulfate 325 (65 FE) MG EC tablet Take 1 tablet (325 mg total) by mouth daily with breakfast. 30 tablet 3   fluorometholone (FML) 0.1 % ophthalmic suspension Place 1 drop into both eyes 2 (two) times daily.      fluticasone (FLONASE) 50 MCG/ACT nasal spray Place 2 sprays into both nostrils daily. 16 g 2   furosemide (LASIX) 40 MG tablet Take by mouth. Take 80 mg twice for 2-3 days, then decrease to 80 mg in the am and 40 mg in the pm     hydrALAZINE (APRESOLINE) 50 MG tablet Take 1.5 tablets (75 mg total) by mouth 3 (three) times daily. 405 tablet 3   loratadine (CLARITIN) 10 MG tablet Take 1 tablet (10 mg total) by mouth daily. 30 tablet 11   montelukast (  SINGULAIR) 10 MG tablet TAKE 1 TABLET ONCE DAILY. 90 tablet 1   pantoprazole (PROTONIX) 40 MG tablet Take 1 tablet (40 mg total) by mouth 2 (two) times daily. 180 tablet 1   potassium chloride (KLOR-CON) 10 MEQ tablet TAKE FOUR TABLETS BY MOUTH DAILY. 120 tablet 3   prednisoLONE acetate (PRED FORTE) 1 % ophthalmic suspension 1 drop 2 (two) times daily.     Spacer/Aero-Holding Chambers (AEROCHAMBER PLUS) inhaler Use as instructed 1 each 2   vitamin B-12 (CYANOCOBALAMIN) 1000 MCG tablet Take 1,000 mcg by mouth daily.     Vitamin D, Cholecalciferol, 25 MCG (1000 UT) TABS Take 1,000 Units by mouth daily.     No current facility-administered medications for this visit.    PHYSICAL EXAMINATION: ECOG PERFORMANCE STATUS: 1 - Symptomatic but completely ambulatory  Vitals:   04/25/21 1357  BP: (!) 148/59  Pulse: 62  Resp: 18  Temp: 98 F (36.7 C)  SpO2: 98%   Wt Readings from Last 3 Encounters:  04/25/21 156 lb 4.8 oz (70.9 kg)  03/24/21 158 lb 9.6 oz (71.9 kg)  03/21/21 158 lb 6.4 oz (71.8 kg)     GENERAL:alert, no distress and comfortable SKIN: skin color normal, no rashes or significant lesions EYES: normal, Conjunctiva are pink and non-injected, sclera clear  NEURO: alert & oriented x 3 with fluent speech  LABORATORY DATA:  I have  reviewed the data as listed CBC Latest Ref Rng & Units 04/25/2021 04/08/2021 03/24/2021  WBC 4.0 - 10.5 K/uL 3.0(L) 4.7 4.2  Hemoglobin 12.0 - 15.0 g/dL 10.8(L) 9.8(L) 8.9(L)  Hematocrit 36.0 - 46.0 % 33.6(L) 31.6(L) 27.9(L)  Platelets 150 - 400 K/uL 190 186 191     CMP Latest Ref Rng & Units 04/25/2021 04/08/2021 03/24/2021  Glucose 70 - 99 mg/dL 109(H) 106(H) 105(H)  BUN 8 - 23 mg/dL 31(H) 30(H) 31(H)  Creatinine 0.44 - 1.00 mg/dL 2.37(H) 2.29(H) 2.35(H)  Sodium 135 - 145 mmol/L 141 142 142  Potassium 3.5 - 5.1 mmol/L 4.0 4.2 4.3  Chloride 98 - 111 mmol/L 107 105 106  CO2 22 - 32 mmol/L 24 25 26   Calcium 8.9 - 10.3 mg/dL 8.8(L) 8.9 9.0  Total Protein 6.5 - 8.1 g/dL 6.3(L) 6.5 6.2(L)  Total Bilirubin 0.3 - 1.2 mg/dL 0.6 0.5 0.5  Alkaline Phos 38 - 126 U/L 40 47 41  AST 15 - 41 U/L 17 19 15   ALT 0 - 44 U/L 6 8 6       RADIOGRAPHIC STUDIES: I have personally reviewed the radiological images as listed and agreed with the findings in the report. No results found.    No orders of the defined types were placed in this encounter.  All questions were answered. The patient knows to call the clinic with any problems, questions or concerns. No barriers to learning was detected. The total time spent in the appointment was 20 minutes.     Truitt Merle, MD 04/25/2021   I, Wilburn Mylar, am acting as scribe for Truitt Merle, MD.   I have reviewed the above documentation for accuracy and completeness, and I agree with the above.

## 2021-04-26 ENCOUNTER — Telehealth: Payer: Self-pay | Admitting: Hematology

## 2021-04-26 NOTE — Telephone Encounter (Signed)
Left message with follow-up appointments per 12/12 los.

## 2021-05-17 ENCOUNTER — Other Ambulatory Visit: Payer: Self-pay | Admitting: Internal Medicine

## 2021-05-17 ENCOUNTER — Ambulatory Visit: Payer: Medicare Other | Admitting: Gastroenterology

## 2021-05-17 ENCOUNTER — Inpatient Hospital Stay: Payer: Medicare Other | Attending: Hematology

## 2021-05-17 ENCOUNTER — Encounter: Payer: Self-pay | Admitting: Gastroenterology

## 2021-05-17 ENCOUNTER — Inpatient Hospital Stay: Payer: Medicare Other

## 2021-05-17 ENCOUNTER — Other Ambulatory Visit: Payer: Self-pay

## 2021-05-17 ENCOUNTER — Telehealth: Payer: Self-pay

## 2021-05-17 VITALS — BP 144/60 | HR 60 | Ht 59.0 in | Wt 153.2 lb

## 2021-05-17 DIAGNOSIS — N183 Chronic kidney disease, stage 3 unspecified: Secondary | ICD-10-CM | POA: Diagnosis not present

## 2021-05-17 DIAGNOSIS — R634 Abnormal weight loss: Secondary | ICD-10-CM | POA: Insufficient documentation

## 2021-05-17 DIAGNOSIS — K219 Gastro-esophageal reflux disease without esophagitis: Secondary | ICD-10-CM | POA: Diagnosis not present

## 2021-05-17 DIAGNOSIS — D631 Anemia in chronic kidney disease: Secondary | ICD-10-CM | POA: Insufficient documentation

## 2021-05-17 DIAGNOSIS — R1013 Epigastric pain: Secondary | ICD-10-CM | POA: Diagnosis not present

## 2021-05-17 DIAGNOSIS — D638 Anemia in other chronic diseases classified elsewhere: Secondary | ICD-10-CM

## 2021-05-17 DIAGNOSIS — R109 Unspecified abdominal pain: Secondary | ICD-10-CM | POA: Diagnosis not present

## 2021-05-17 DIAGNOSIS — I129 Hypertensive chronic kidney disease with stage 1 through stage 4 chronic kidney disease, or unspecified chronic kidney disease: Secondary | ICD-10-CM | POA: Diagnosis not present

## 2021-05-17 DIAGNOSIS — Z79899 Other long term (current) drug therapy: Secondary | ICD-10-CM | POA: Insufficient documentation

## 2021-05-17 DIAGNOSIS — Z7902 Long term (current) use of antithrombotics/antiplatelets: Secondary | ICD-10-CM

## 2021-05-17 DIAGNOSIS — R1310 Dysphagia, unspecified: Secondary | ICD-10-CM | POA: Insufficient documentation

## 2021-05-17 LAB — CBC WITH DIFFERENTIAL/PLATELET
Abs Immature Granulocytes: 0.01 10*3/uL (ref 0.00–0.07)
Basophils Absolute: 0 10*3/uL (ref 0.0–0.1)
Basophils Relative: 1 %
Eosinophils Absolute: 0.1 10*3/uL (ref 0.0–0.5)
Eosinophils Relative: 4 %
HCT: 37.2 % (ref 36.0–46.0)
Hemoglobin: 11.6 g/dL — ABNORMAL LOW (ref 12.0–15.0)
Immature Granulocytes: 0 %
Lymphocytes Relative: 30 %
Lymphs Abs: 1 10*3/uL (ref 0.7–4.0)
MCH: 28.5 pg (ref 26.0–34.0)
MCHC: 31.2 g/dL (ref 30.0–36.0)
MCV: 91.4 fL (ref 80.0–100.0)
Monocytes Absolute: 0.3 10*3/uL (ref 0.1–1.0)
Monocytes Relative: 8 %
Neutro Abs: 1.8 10*3/uL (ref 1.7–7.7)
Neutrophils Relative %: 57 %
Platelets: 149 10*3/uL — ABNORMAL LOW (ref 150–400)
RBC: 4.07 MIL/uL (ref 3.87–5.11)
RDW: 13.7 % (ref 11.5–15.5)
WBC: 3.2 10*3/uL — ABNORMAL LOW (ref 4.0–10.5)
nRBC: 0 % (ref 0.0–0.2)

## 2021-05-17 LAB — COMPREHENSIVE METABOLIC PANEL
ALT: 6 U/L (ref 0–44)
AST: 18 U/L (ref 15–41)
Albumin: 3.6 g/dL (ref 3.5–5.0)
Alkaline Phosphatase: 38 U/L (ref 38–126)
Anion gap: 9 (ref 5–15)
BUN: 41 mg/dL — ABNORMAL HIGH (ref 8–23)
CO2: 26 mmol/L (ref 22–32)
Calcium: 9 mg/dL (ref 8.9–10.3)
Chloride: 105 mmol/L (ref 98–111)
Creatinine, Ser: 2.3 mg/dL — ABNORMAL HIGH (ref 0.44–1.00)
GFR, Estimated: 20 mL/min — ABNORMAL LOW (ref >60)
Glucose, Bld: 130 mg/dL — ABNORMAL HIGH (ref 70–99)
Potassium: 3.5 mmol/L (ref 3.5–5.1)
Sodium: 140 mmol/L (ref 135–145)
Total Bilirubin: 0.5 mg/dL (ref 0.3–1.2)
Total Protein: 6.3 g/dL — ABNORMAL LOW (ref 6.5–8.1)

## 2021-05-17 NOTE — Telephone Encounter (Signed)
[facility] Pre-operative Risk Assessment     Request for surgical clearance:     Endoscopy Procedure  What type of surgery is being performed?     EGD  When is this surgery scheduled?     05-27-2021  What type of clearance is required ?   Pharmacy  Are there any medications that need to be held prior to surgery and how long? Yes, Plavix 5 days   Practice name and name of physician performing surgery?      [facility]  What is your office phone and fax number?      Phone- [phone]  [phone]  Anesthesia type (None, local, MAC, general) ?       MAC

## 2021-05-17 NOTE — Progress Notes (Addendum)
[facility] Consult Note:  History: [name] 05/17/2021  Referring provider: Burnis Medin, MD  Reason for consult/chief complaint: Abdominal Pain (Epigastric/upper abd pains and burning) and Bloated (Get full in epigastric area when eating)   Subjective  HPI: Referred by Dr. Burr Lopez for epigastric pain and dysphagia.  She has been seeing patient for anemia of chronic kidney disease, treating with iron and Aranesp. I saw her in 2018 and 2019 for abdominal pain and tenderness from a large rectus diastasis.   EGD with mild patchy gastric erythema, biopsies negative for H. pylori.  CTAP done at that time as well.  Dr. Burr Lopez ordered another CT in the last few months.  [name] describes symptoms similar to when I saw her in the past, with upper abdominal pain and tenderness to palpation, describes it as a burning discomfort and has early satiety.  She felt she had lost some weight, and looks like she is down 5 pounds from encounter 2 months ago. Denies dysphagia or odynophagia.  Bowel habits generally regular, no rectal bleeding.   ROS:  Review of Systems  Constitutional:  Negative for appetite change and unexpected weight change.  HENT:  Negative for mouth sores and voice change.   Eyes:  Negative for pain and redness.  Respiratory:  Negative for cough and shortness of breath.   Cardiovascular:  Negative for chest pain and palpitations.  Genitourinary:  Negative for dysuria and hematuria.  Musculoskeletal:  Positive for back pain. Negative for arthralgias and myalgias.  Skin:  Negative for pallor and rash.  Neurological:  Negative for weakness and headaches.  Hematological:  Negative for adenopathy.   Mostly troubled by back pain last several months.  Past Medical History: Past Medical History:  Diagnosis Date   Acute superficial venous thrombosis of left lower extremity 03/27/2014   concern about hx and potential extensive on exam tenderness calf and  low dose lovenox 40 qd 2-4 weekselevetion and close  fu advised .    Allergy    Anemia    Anxiety    Arthritis    "shoulders" (09/09/2012)   Asthma 09/08/2012   Carotid bruit    [place] 2018  low risk 1 - 39%   Chronic kidney disease    Diabetes mellitus without complication ([place])    "Borderline" per pt; being monitored.  no meds per pt   Diverticulosis    Dyspnea 04/27/2014   GERD (gastroesophageal reflux disease)    Headache(784.0)    "related to my high blood pressure" (09/09/2012)   Hiatal hernia    History of DVT (deep vein thrombosis)    "RLE" (09/09/2012) coumadine cant take asa so on plavix    HTN (hypertension) 09/08/2012   Hyperlipidemia    Lupus ([place])    "cured years ago" (09/09/2012)   Other dysphagia 02/11/2013   Syncope and collapse 01/21/2018   Varicose veins    Varicose veins of leg with complications 38/18/2993   Most recent hematology office note reviewed.  Most recent pulmonary office note from 01/12/2021 reviewed, history of asthma/obstructive lung disease with significant dyspnea.  Last cardiology note form 03/21/21 reviewed. (Can't take coumadin for Hx RLE DVT 2014 - so on plavix   Past Surgical History: Past Surgical History:  Procedure Laterality Date   ABDOMINAL HYSTERECTOMY     partial   BREAST EXCISIONAL BIOPSY Right    BREAST SURGERY     CARPAL TUNNEL RELEASE Right 2000's   CARPAL TUNNEL RELEASE Bilateral 10/21/2013  Procedure: BILATERAL  CARPAL TUNNEL RELEASE;  Surgeon: [name], MD;  Location: [facility];  Service: Orthopedics;  Laterality: Bilateral;   SHOULDER OPEN ROTATOR CUFF REPAIR Bilateral 2000's   TRIGGER FINGER RELEASE Right 10/21/2013   Procedure: RELEASE TRIGGER FINGER/A-1 PULLEY RIGHT MIDDLE AND RIGHT RING;  Surgeon: [name], MD;  Location: [facility];  Service: Orthopedics;  Laterality: Right;     Family History: Family History  Problem Relation Age of Onset   Hypertension Mother    Lung  cancer Mother    Hypertension Father    Lung cancer Father    Breast cancer Sister    Colon cancer Sister        ? 35' s dx - died in 81's   Breast cancer Sister    Hypertension Brother    Rectal cancer Brother    Stomach cancer Brother    Breast cancer Brother    Heart attack Daughter    Esophageal cancer Daughter    Hypertension Daughter    Diabetes Daughter    Breast cancer Paternal Aunt    Lung cancer Other        both parents    Pancreatic cancer Neg Hx     Social History: Social History   Socioeconomic History   Marital status: Single    Spouse name: Not on file   Number of children: 6   Years of education: Not on file   Highest education level: Not on file  Occupational History   Occupation: retired  Tobacco Use   Smoking status: Never   Smokeless tobacco: Never  Vaping Use   Vaping Use: Never used  Substance and Sexual Activity   Alcohol use: No   Drug use: No   Sexual activity: Not Currently    Birth control/protection: Surgical  Other Topics Concern   Not on file  Social History Narrative   2 people living in the home.  Grand daughter.   Up and down through the night      Had 7 children  2 deceased . 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    works 3 days per week currently .   Glasses dentures  Neg tad    Social Determinants of Health   Financial Resource Strain: Not on file  Food Insecurity: Not on file  Transportation Needs: Not on file  Physical Activity: Not on file  Stress: Not on file  Social Connections: Not on file    Allergies: Allergies  Allergen Reactions   Penicillins Nausea And Vomiting and Other (See Comments)    Has patient had a PCN reaction causing immediate rash, facial/tongue/throat swelling, SOB or lightheadedness with hypotension: Y Has patient had a PCN reaction causing severe rash involving mucus membranes or skin necrosis: Y Has patient  had a PCN reaction that required hospitalization: N Has patient had a PCN reaction occurring within the last 10 years: N If all of the above answers are "NO", then may proceed with Cephalosporin use.    Aspirin Other (See Comments)    Wheezing Acetaminophen is OK    Outpatient Meds: Current Outpatient Medications  Medication Sig Dispense Refill   albuterol (PROVENTIL) (2.5 MG/3ML) 0.083% nebulizer solution USE 1 AMPULE EVERY 6 HOURS AS NEEDED FOR WHEEZE 75 mL 1   albuterol (VENTOLIN HFA) 108 (90 Base) MCG/ACT inhaler USE 2 PUFFS EVERY 6  HOURS AS NEEDED FOR WHEEZING. 8.5 g 3   amLODipine (NORVASC) 5 MG tablet TAKE 1 TABLET BY MOUTH DAILY. 90 tablet 3   atorvastatin (LIPITOR) 20 MG tablet TAKE ONE TABLET BY MOUTH DAILY. 90 tablet 1   azaTHIOprine (IMURAN) 50 MG tablet Take 25 mg by mouth daily.     Azelastine-Fluticasone (DYMISTA) 137-50 MCG/ACT SUSP Place 1 spray into the nose in the morning and at bedtime. 23 g 6   B Complex Vitamins (VITAMIN B COMPLEX) TABS Take 1 tablet by mouth daily.     Budeson-Glycopyrrol-Formoterol (BREZTRI AEROSPHERE) 160-9-4.8 MCG/ACT AERO INHALE 2 PUFFS INTO THE LUNGS TWICE DAILY, IN THE MORNING & AT BEDTIME. 10.7 g 6   carvedilol (COREG) 6.25 MG tablet Take 6.25 mg by mouth daily.     citalopram (CELEXA) 20 MG tablet Take 1 tablet (20 mg total) by mouth daily. 30 tablet 0   clopidogrel (PLAVIX) 75 MG tablet TAKE 1 TABLET ONCE DAILY. 30 tablet 1   diclofenac Sodium (VOLTAREN) 1 % GEL Apply 4 g topically 4 (four) times daily. 100 g 6   doxazosin (CARDURA) 2 MG tablet TAKE THREE TABLETS BY MOUTH AT BEDTIME 270 tablet 1   ferrous sulfate 325 (65 FE) MG EC tablet Take 1 tablet (325 mg total) by mouth daily with breakfast. 30 tablet 3   fluorometholone (FML) 0.1 % ophthalmic suspension Place 1 drop into both eyes 2 (two) times daily.      fluticasone (FLONASE) 50 MCG/ACT nasal spray Place 2 sprays into both nostrils daily. 16 g 2   furosemide (LASIX) 40 MG tablet  Take by mouth. Take 80 mg twice for 2-3 days, then decrease to 80 mg in the am and 40 mg in the pm     hydrALAZINE (APRESOLINE) 50 MG tablet Take 1.5 tablets (75 mg total) by mouth 3 (three) times daily. 405 tablet 3   montelukast (SINGULAIR) 10 MG tablet TAKE 1 TABLET ONCE DAILY. 90 tablet 1   pantoprazole (PROTONIX) 40 MG tablet Take 1 tablet (40 mg total) by mouth 2 (two) times daily. 180 tablet 1   potassium chloride (KLOR-CON) 10 MEQ tablet TAKE FOUR TABLETS BY MOUTH DAILY. 120 tablet 3   prednisoLONE acetate (PRED FORTE) 1 % ophthalmic suspension 1 drop 2 (two) times daily.     Spacer/Aero-Holding Chambers (AEROCHAMBER PLUS) inhaler Use as instructed 1 each 2   vitamin B-12 (CYANOCOBALAMIN) 1000 MCG tablet Take 1,000 mcg by mouth daily.     Vitamin D, Cholecalciferol, 25 MCG (1000 UT) TABS Take 1,000 Units by mouth daily.     No current facility-administered medications for this visit.      ___________________________________________________________________ Objective   Exam:  BP (!) 144/60 (BP Location: Left Arm, Patient Position: Sitting, Cuff Size: Normal)    Pulse 60    Ht 4\' 11"  (1.499 m) Comment: height measured without shoes   Wt 153 lb 4 oz (69.5 kg)    BMI 30.95 kg/m  Wt Readings from Last 3 Encounters:  05/17/21 153 lb 4 oz (69.5 kg)  04/25/21 156 lb 4.8 oz (70.9 kg)  03/24/21 158 lb 9.6 oz (71.9 kg)    General: Well-appearing, pleasant and conversational.  Antalgic gait, gets on exam table with minimal assistance. Eyes: sclera anicteric, no redness ENT: oral mucosa moist without lesions, no cervical or supraclavicular lymphadenopathy CV: RRR without murmur, S1/S2, no JVD, marked bilateral peripheral edema Resp: clear to auscultation bilaterally, normal RR and effort noted GI: soft, large rectus diastasis before  that is tender to light palpation of abdominal wall, with active bowel sounds. No guarding or palpable organomegaly noted. Skin; warm and dry, no rash or  jaundice noted Neuro: awake, alert and oriented x 3. Normal gross motor function and fluent speech  Labs:  CBC Latest Ref Rng & Units 04/25/2021 04/08/2021 03/24/2021  WBC 4.0 - 10.5 K/uL 3.0(L) 4.7 4.2  Hemoglobin 12.0 - 15.0 g/dL 10.8(L) 9.8(L) 8.9(L)  Hematocrit 36.0 - 46.0 % 33.6(L) 31.6(L) 27.9(L)  Platelets 150 - 400 K/uL 190 186 191     Radiologic Studies:  CLINICAL DATA:  Abdominal pain.  Low hemoglobin.   EXAM: CT ABDOMEN AND PELVIS WITHOUT CONTRAST   TECHNIQUE: Multidetector CT imaging of the abdomen and pelvis was performed following the standard protocol without IV contrast.   COMPARISON:  11/02/2018   FINDINGS: Lower chest: No acute findings.   Hepatobiliary: No mass visualized on this unenhanced exam. Gallbladder is unremarkable. No evidence of biliary ductal dilatation.   Pancreas: No mass or inflammatory process visualized on this unenhanced exam.   Spleen:  Within normal limits in size.   Adrenals/Urinary tract: No evidence of urolithiasis or hydronephrosis. A few small fluid attenuation cysts are again noted. Unremarkable unopacified urinary bladder.   Stomach/Bowel: Stable small hiatal hernia. No evidence of obstruction, inflammatory process, or abnormal fluid collections. Diverticulosis is seen mainly involving the sigmoid colon, however there is no evidence of diverticulitis.   Vascular/Lymphatic: No pathologically enlarged lymph nodes identified. No evidence of abdominal aortic aneurysm. Aortic atherosclerotic calcification noted.   Reproductive: Prior hysterectomy noted. Adnexal regions are unremarkable in appearance.   Other: None. No evidence of retroperitoneal hemorrhage or hemoperitoneum.   Musculoskeletal:  No suspicious bone lesions identified.   IMPRESSION: No evidence of urolithiasis, hydronephrosis, or other acute findings.   Colonic diverticulosis, without radiographic evidence of diverticulitis.   Stable small hiatal  hernia.     Electronically Signed   By: Marlaine Hind M.D.   On: 03/16/2021 15:01 ________________________________  1. The left ventricle has normal systolic function with an ejection  fraction of 60-65%. The cavity size was mildly dilated. There is mildly  increased left ventricular wall thickness. Left ventricular diastolic  Doppler parameters are consistent with  impaired relaxation. Elevated mean left atrial pressure.   2. The right ventricle has normal systolic function. The cavity was  normal. Right ventricular systolic pressure is moderately elevated with an  estimated pressure of 47.9 mmHg.   3. Left atrial size was moderately dilated.   4. The mitral valve is abnormal. Mild calcification of the mitral valve  leaflet.   5. The aortic valve is tricuspid. Mild calcification of the aortic valve.  No stenosis of the aortic valve.   6. Normal LV systolic function; mild LVH; mild LVE; mild diastolic  dysfunction with elevated LV filling pressure; moderate LAE; estimated  RVSP moderately elevated    Assessment: Encounter Diagnoses  Name Primary?   Epigastric pain Yes   Weight loss    Long term (current) use of antithrombotics/antiplatelets     It is difficult to tell from talking with her if these are the same symptoms occurring since her work-up in 2018 and if so whether they have been consistent or intermittent, though sounds more like the latter.  It may very well be related to her large rectus diastasis as before, though she does have early satiety and modest recent weight loss, so I recommended some additional testing to be sure there is no harmful cause.  We discussed an upper endoscopy, she was agreeable after discussion of procedure and risks.  The benefits and risks of the planned procedure were described in detail with the patient or (when appropriate) their health care proxy.  Risks were outlined as including, but not limited to, bleeding, infection, perforation, adverse  medication reaction leading to cardiac or pulmonary decompensation, pancreatitis (if ERCP).  The limitation of incomplete mucosal visualization was also discussed.  No guarantees or warranties were given.  Patient at increased risk for cardiopulmonary complications of procedure due to medical comorbidities.  (Asthma, pulmonary hypertension)  She wanted to discuss it with her granddaughter, so we got her on the phone and I explained everything. All questions were answered and they were agreeable to proceed.  Maesyn needs to be off Plavix 5 days before procedure.  Her PCP prescribes that, so we will send a note to them.  I expect they will be agreeable since it has been so many years since the DVT.  Thank you for the courtesy of this consult.  Please call me with any questions or concerns.  Nelida Meuse III  CC: Referring provider noted above

## 2021-05-17 NOTE — Patient Instructions (Signed)
If you are age 86 or older, your body mass index should be between 23-30. Your Body mass index is 30.95 kg/m. If this is out of the aforementioned range listed, please consider follow up with your Primary Care Provider.  If you are age 41 or younger, your body mass index should be between 19-25. Your Body mass index is 30.95 kg/m. If this is out of the aformentioned range listed, please consider follow up with your Primary Care Provider.   ________________________________________________________  The [facility] providers would like to encourage you to use [facility] to communicate with providers for non-urgent requests or questions.  Due to long hold times on the telephone, sending your provider a message by [facility] may be a faster and more efficient way to get a response.  Please allow 48 business hours for a response.  Please remember that this is for non-urgent requests.  _______________________________________________________  [name] have been scheduled for an endoscopy. Please follow written instructions given to you at your visit today. If you use inhalers (even only as needed), please bring them with you on the day of your procedure.  You will be contacted by our office prior to your procedure for directions on holding your Plavix.  If you do not hear from our office 1 week prior to your scheduled procedure, please call [phone] to discuss.    It was a pleasure to see you today!  Thank you for trusting me with your gastrointestinal care!

## 2021-05-17 NOTE — Progress Notes (Signed)
Pt's Hgb is 11.6 today therefore she does not meet the parameters to receive an Aranesp injection.

## 2021-05-18 NOTE — Telephone Encounter (Signed)
Patient is not on plavix due to cardiac reasons. Plavix is prescribed by PCP Dr. Regis Bill, will defer the decision to hold plavix to her PCP. Also note, unclear why she is on plavix. Oldest record that showed she was on plavix is from 2012, but she likely has been on plavix even prior to that. Will remove from cardiac clearance pool

## 2021-05-19 NOTE — Telephone Encounter (Signed)
Pharmacy called to get refill on potassium chloride (KLOR-CON) 10 MEQ tablet     Please send to   [facility] [place] Phone:  [phone]  Fax:  [phone]         Please advise

## 2021-05-19 NOTE — Telephone Encounter (Signed)
Please advise on the request to hold patients Plavix prior to scheduled colonoscopy. Thank you.

## 2021-05-20 NOTE — Telephone Encounter (Signed)
Urgent response required. Patient will have to cancel scheduled procedures if no response today.  Thank you,  Kayvan Hoefling,CMA

## 2021-05-20 NOTE — Telephone Encounter (Signed)
Several attempts to reach the patient have been made to reach this patient and give instructions to hold Plavix 5 days prior to the procedure on 05-27-2021.

## 2021-05-20 NOTE — Telephone Encounter (Signed)
Left message to return call on cell number provided in Epic. I also called the 2 other numbers provided, no answer and no voice mail boxes set up.

## 2021-05-20 NOTE — Telephone Encounter (Signed)
OK to stop the   Plavix  ( I am not in office today )

## 2021-05-23 NOTE — Telephone Encounter (Signed)
No contact has been made with this patient.

## 2021-05-23 NOTE — Telephone Encounter (Signed)
Called all 3 numbers on file. Cannot make contact with this patient. The alternate contact is the same number listed on file for the cell phone number.

## 2021-05-24 NOTE — Telephone Encounter (Signed)
Please mail the patient a letter with these instructions, and ask her to call the office and confirm receipt of those instructions.  If the patient shows up for her upper endoscopy and has not held her Plavix 5 days before, we will cancel the procedure that day.  HD

## 2021-05-24 NOTE — Telephone Encounter (Signed)
I have not been able to reach this patient or her granddaughter

## 2021-05-24 NOTE — Telephone Encounter (Signed)
Letter mailed

## 2021-05-25 NOTE — Telephone Encounter (Signed)
Patient has moved her procedure date to 06-15-2021. I have still not spoken to this patient to clarify the instructions for holding the Plavix

## 2021-05-27 ENCOUNTER — Encounter: Payer: Medicare Other | Admitting: Gastroenterology

## 2021-06-03 ENCOUNTER — Other Ambulatory Visit: Payer: Self-pay | Admitting: Internal Medicine

## 2021-06-03 ENCOUNTER — Other Ambulatory Visit: Payer: Self-pay | Admitting: Family Medicine

## 2021-06-03 DIAGNOSIS — Z79899 Other long term (current) drug therapy: Secondary | ICD-10-CM | POA: Diagnosis not present

## 2021-06-03 DIAGNOSIS — H44113 Panuveitis, bilateral: Secondary | ICD-10-CM | POA: Diagnosis not present

## 2021-06-03 NOTE — Telephone Encounter (Signed)
Spoke with patient she is aware of holding Plavix for 5 days prior to her procedure. Patient voiced understanding.

## 2021-06-06 ENCOUNTER — Inpatient Hospital Stay: Payer: Medicare Other

## 2021-06-06 ENCOUNTER — Other Ambulatory Visit: Payer: Self-pay

## 2021-06-06 VITALS — BP 174/60 | HR 76 | Temp 98.6°F | Resp 18

## 2021-06-06 DIAGNOSIS — D638 Anemia in other chronic diseases classified elsewhere: Secondary | ICD-10-CM

## 2021-06-06 DIAGNOSIS — D631 Anemia in chronic kidney disease: Secondary | ICD-10-CM | POA: Diagnosis not present

## 2021-06-06 DIAGNOSIS — N183 Chronic kidney disease, stage 3 unspecified: Secondary | ICD-10-CM | POA: Diagnosis not present

## 2021-06-06 DIAGNOSIS — R1310 Dysphagia, unspecified: Secondary | ICD-10-CM | POA: Diagnosis not present

## 2021-06-06 DIAGNOSIS — I129 Hypertensive chronic kidney disease with stage 1 through stage 4 chronic kidney disease, or unspecified chronic kidney disease: Secondary | ICD-10-CM | POA: Diagnosis not present

## 2021-06-06 DIAGNOSIS — Z79899 Other long term (current) drug therapy: Secondary | ICD-10-CM | POA: Diagnosis not present

## 2021-06-06 DIAGNOSIS — K219 Gastro-esophageal reflux disease without esophagitis: Secondary | ICD-10-CM | POA: Diagnosis not present

## 2021-06-06 DIAGNOSIS — R109 Unspecified abdominal pain: Secondary | ICD-10-CM | POA: Diagnosis not present

## 2021-06-06 DIAGNOSIS — R634 Abnormal weight loss: Secondary | ICD-10-CM | POA: Diagnosis not present

## 2021-06-06 LAB — CBC WITH DIFFERENTIAL/PLATELET
Abs Immature Granulocytes: 0.01 10*3/uL (ref 0.00–0.07)
Basophils Absolute: 0 10*3/uL (ref 0.0–0.1)
Basophils Relative: 1 %
Eosinophils Absolute: 0.1 10*3/uL (ref 0.0–0.5)
Eosinophils Relative: 3 %
HCT: 33.7 % — ABNORMAL LOW (ref 36.0–46.0)
Hemoglobin: 10.6 g/dL — ABNORMAL LOW (ref 12.0–15.0)
Immature Granulocytes: 0 %
Lymphocytes Relative: 28 %
Lymphs Abs: 0.9 10*3/uL (ref 0.7–4.0)
MCH: 28.5 pg (ref 26.0–34.0)
MCHC: 31.5 g/dL (ref 30.0–36.0)
MCV: 90.6 fL (ref 80.0–100.0)
Monocytes Absolute: 0.3 10*3/uL (ref 0.1–1.0)
Monocytes Relative: 9 %
Neutro Abs: 2 10*3/uL (ref 1.7–7.7)
Neutrophils Relative %: 59 %
Platelets: 160 10*3/uL (ref 150–400)
RBC: 3.72 MIL/uL — ABNORMAL LOW (ref 3.87–5.11)
RDW: 13.7 % (ref 11.5–15.5)
WBC: 3.3 10*3/uL — ABNORMAL LOW (ref 4.0–10.5)
nRBC: 0 % (ref 0.0–0.2)

## 2021-06-06 LAB — COMPREHENSIVE METABOLIC PANEL
ALT: 5 U/L (ref 0–44)
AST: 17 U/L (ref 15–41)
Albumin: 3.6 g/dL (ref 3.5–5.0)
Alkaline Phosphatase: 33 U/L — ABNORMAL LOW (ref 38–126)
Anion gap: 7 (ref 5–15)
BUN: 38 mg/dL — ABNORMAL HIGH (ref 8–23)
CO2: 31 mmol/L (ref 22–32)
Calcium: 9.3 mg/dL (ref 8.9–10.3)
Chloride: 105 mmol/L (ref 98–111)
Creatinine, Ser: 2.28 mg/dL — ABNORMAL HIGH (ref 0.44–1.00)
GFR, Estimated: 21 mL/min — ABNORMAL LOW (ref >60)
Glucose, Bld: 98 mg/dL (ref 70–99)
Potassium: 3.5 mmol/L (ref 3.5–5.1)
Sodium: 143 mmol/L (ref 135–145)
Total Bilirubin: 0.4 mg/dL (ref 0.3–1.2)
Total Protein: 6 g/dL — ABNORMAL LOW (ref 6.5–8.1)

## 2021-06-06 MED ORDER — DARBEPOETIN ALFA 100 MCG/0.5ML IJ SOSY
100.0000 ug | PREFILLED_SYRINGE | Freq: Once | INTRAMUSCULAR | Status: AC
  Administered 2021-06-06: 100 ug via SUBCUTANEOUS
  Filled 2021-06-06: qty 0.5

## 2021-06-06 NOTE — Patient Instructions (Signed)
Darbepoetin Alfa injection ?What is this medication? ?DARBEPOETIN ALFA (dar be POE e tin  AL fa) helps your body make more red blood cells. It is used to treat anemia caused by chronic kidney failure and chemotherapy. ?This medicine may be used for other purposes; ask your health care provider or pharmacist if you have questions. ?COMMON BRAND NAME(S): Aranesp ?What should I tell my care team before I take this medication? ?They need to know if you have any of these conditions: ?blood clotting disorders or history of blood clots ?cancer patient not on chemotherapy ?cystic fibrosis ?heart disease, such as angina, heart failure, or a history of a heart attack ?hemoglobin level of 12 g/dL or greater ?high blood pressure ?low levels of folate, iron, or vitamin B12 ?seizures ?an unusual or allergic reaction to darbepoetin, erythropoietin, albumin, hamster proteins, latex, other medicines, foods, dyes, or preservatives ?pregnant or trying to get pregnant ?breast-feeding ?How should I use this medication? ?This medicine is for injection into a vein or under the skin. It is usually given by a health care professional in a hospital or clinic setting. ?If you get this medicine at home, you will be taught how to prepare and give this medicine. Use exactly as directed. Take your medicine at regular intervals. Do not take your medicine more often than directed. ?It is important that you put your used needles and syringes in a special sharps container. Do not put them in a trash can. If you do not have a sharps container, call your pharmacist or healthcare provider to get one. ?A special MedGuide will be given to you by the pharmacist with each prescription and refill. Be sure to read this information carefully each time. ?Talk to your pediatrician regarding the use of this medicine in children. While this medicine may be used in children as young as 1 month of age for selected conditions, precautions do apply. ?Overdosage: If  you think you have taken too much of this medicine contact a poison control center or emergency room at once. ?NOTE: This medicine is only for you. Do not share this medicine with others. ?What if I miss a dose? ?If you miss a dose, take it as soon as you can. If it is almost time for your next dose, take only that dose. Do not take double or extra doses. ?What may interact with this medication? ?Do not take this medicine with any of the following medications: ?epoetin alfa ?This list may not describe all possible interactions. Give your health care provider a list of all the medicines, herbs, non-prescription drugs, or dietary supplements you use. Also tell them if you smoke, drink alcohol, or use illegal drugs. Some items may interact with your medicine. ?What should I watch for while using this medication? ?Your condition will be monitored carefully while you are receiving this medicine. ?You may need blood work done while you are taking this medicine. ?This medicine may cause a decrease in vitamin B6. You should make sure that you get enough vitamin B6 while you are taking this medicine. Discuss the foods you eat and the vitamins you take with your health care professional. ?What side effects may I notice from receiving this medication? ?Side effects that you should report to your doctor or health care professional as soon as possible: ?allergic reactions like skin rash, itching or hives, swelling of the face, lips, or tongue ?breathing problems ?changes in vision ?chest pain ?confusion, trouble speaking or understanding ?feeling faint or lightheaded, falls ?high blood   pressure ?muscle aches or pains ?pain, swelling, warmth in the leg ?rapid weight gain ?severe headaches ?sudden numbness or weakness of the face, arm or leg ?trouble walking, dizziness, loss of balance or coordination ?seizures (convulsions) ?swelling of the ankles, feet, hands ?unusually weak or tired ?Side effects that usually do not require  medical attention (report to your doctor or health care professional if they continue or are bothersome): ?diarrhea ?fever, chills (flu-like symptoms) ?headaches ?nausea, vomiting ?redness, stinging, or swelling at site where injected ?This list may not describe all possible side effects. Call your doctor for medical advice about side effects. You may report side effects to FDA at 1-800-FDA-1088. ?Where should I keep my medication? ?Keep out of the reach of children. ?Store in a refrigerator between 2 and 8 degrees C (36 and 46 degrees F). Do not freeze. Do not shake. Throw away any unused portion if using a single-dose vial. Throw away any unused medicine after the expiration date. ?NOTE: This sheet is a summary. It may not cover all possible information. If you have questions about this medicine, talk to your doctor, pharmacist, or health care provider. ?? 2022 Elsevier/Gold Standard (2017-05-21 00:00:00) ? ?

## 2021-06-06 NOTE — Progress Notes (Signed)
Pt is here for Aranesp. Blood pressure is at 174/60, and asymptomatic. Pt has taken her blood pressure medication at 1330 today as well. Dr. Burr Medico is aware and gave the okay to proceed with injection.

## 2021-06-07 DIAGNOSIS — H35033 Hypertensive retinopathy, bilateral: Secondary | ICD-10-CM | POA: Diagnosis not present

## 2021-06-07 DIAGNOSIS — Z79899 Other long term (current) drug therapy: Secondary | ICD-10-CM | POA: Diagnosis not present

## 2021-06-07 DIAGNOSIS — H44113 Panuveitis, bilateral: Secondary | ICD-10-CM | POA: Diagnosis not present

## 2021-06-14 ENCOUNTER — Encounter: Payer: Self-pay | Admitting: Certified Registered Nurse Anesthetist

## 2021-06-15 ENCOUNTER — Encounter: Payer: Self-pay | Admitting: Gastroenterology

## 2021-06-15 ENCOUNTER — Ambulatory Visit (AMBULATORY_SURGERY_CENTER): Payer: Medicare Other | Admitting: Gastroenterology

## 2021-06-15 VITALS — BP 164/48 | HR 46 | Temp 98.0°F | Resp 18 | Ht 59.0 in | Wt 153.0 lb

## 2021-06-15 DIAGNOSIS — R6881 Early satiety: Secondary | ICD-10-CM | POA: Diagnosis not present

## 2021-06-15 DIAGNOSIS — R634 Abnormal weight loss: Secondary | ICD-10-CM | POA: Diagnosis not present

## 2021-06-15 DIAGNOSIS — K222 Esophageal obstruction: Secondary | ICD-10-CM | POA: Diagnosis not present

## 2021-06-15 DIAGNOSIS — R1013 Epigastric pain: Secondary | ICD-10-CM

## 2021-06-15 MED ORDER — SODIUM CHLORIDE 0.9 % IV SOLN: 500.0000 mL | INTRAVENOUS | Status: DC

## 2021-06-15 NOTE — Progress Notes (Signed)
Report given to PACU, vss 

## 2021-06-15 NOTE — Op Note (Signed)
[facility] Patient Name: Tyliyah Waits Procedure Date: 06/15/2021 10:13 AM MRN: [id number] Endoscopist: Sherilyn Cooter L. Myrtie Neither , MD Age: 88 Referring MD:  Date of Birth: March 28, 1934 Gender: Female Account #: [id number] Procedure:                Upper GI endoscopy Indications:              Epigastric abdominal pain, Early satiety, Weight                            loss                           no source on [place] CTAP Nov '22                           years of upper abdominal pain from rectus                            diastasis, other syptoms more recent Medicines:                Monitored Anesthesia Care Procedure:                Pre-Anesthesia Assessment:                           - Prior to the procedure, a History and Physical                            was performed, and patient medications and                            allergies were reviewed. The patient's tolerance of                            previous anesthesia was also reviewed. The risks                            and benefits of the procedure and the sedation                            options and risks were discussed with the patient.                            All questions were answered, and informed consent                            was obtained. Prior Anticoagulants: The patient has                            taken Plavix (clopidogrel), last dose was 5 days                            prior to procedure. ASA Grade Assessment: II - A  patient with mild systemic disease. After reviewing                            the risks and benefits, the patient was deemed in                            satisfactory condition to undergo the procedure.                           After obtaining informed consent, the endoscope was                            passed under direct vision. Throughout the                            procedure, the patient's blood pressure, pulse, and                            oxygen  saturations were monitored continuously. The                            Endoscope was introduced through the mouth, and                            advanced to the second part of duodenum. The upper                            GI endoscopy was accomplished without difficulty.                            The patient tolerated the procedure well. Scope In: Scope Out: Findings:                 The larynx was normal.                           A 1-2 cm sliding hiatal hernia was present.                           A mild Schatzki ring was found in the lower third                            of the esophagus.                           Diffuse mildly erythematous and cobblestoned mucosa                            was found in the gastric fundus and in the gastric                            body. Multiple biopsies were obtained on the  greater curvature of the gastric body, on the                            lesser curvature of the gastric body and in the                            gastric antrum with cold forceps for histology.                            (gastric Bx in past neg for H pylori)                           The exam of the stomach was otherwise normal,                            including on retroflexion. The stomach distended                            well with air.                           Mucosal changes characterized by melanosis were                            found in the entire duodenum. Complications:            No immediate complications. Estimated Blood Loss:     Estimated blood loss was minimal. Impression:               - Normal larynx.                           - 1 cm hiatal hernia.                           - Mild Schatzki ring.                           - Erythematous mucosa in the gastric fundus and                            gastric body.                           - Mucosal changes in the duodenum.                           - Multiple biopsies  were obtained on the greater                            curvature of the gastric body, on the lesser                            curvature of the gastric body and in the gastric  antrum.                           Gastric mucosal findings of doubtful clinical                            significance. Recommendation:           - Patient has a contact number available for                            emergencies. The signs and symptoms of potential                            delayed complications were discussed with the                            patient. Return to normal activities tomorrow.                            Written discharge instructions were provided to the                            patient.                           - Resume previous diet.                           - Resume Plavix (clopidogrel) at prior dose                            tomorrow.                           - Await pathology results. Khayman Kirsch L. Myrtie Neither, MD 06/15/2021 10:41:14 AM This report has been signed electronically.

## 2021-06-15 NOTE — Progress Notes (Signed)
1015 Robinul 0.1 mg IV given due large amount of secretions upon assessment.  MD made aware, vss 

## 2021-06-15 NOTE — Progress Notes (Signed)
DT - VS  Last dose of PLAVIX was 06/08/21.  Sign hanging on IV pole.     Pt has inhaler with label at the bedside.

## 2021-06-15 NOTE — Progress Notes (Signed)
No changes to clinical history since GI office visit on 05/17/21.  The patient is appropriate for an endoscopic procedure in the ambulatory setting.

## 2021-06-15 NOTE — Patient Instructions (Signed)
Thank you for allowing [place] to care for you today! Biopsy results will be available in approximately 1-2 weeks. Resume previous diet and medications today, except PLAVIX, which you may re-start tomorrow 06/16/21. Return to normal daily activities tomorrow, 06/16/21.  YOU HAD AN ENDOSCOPIC PROCEDURE TODAY AT [facility]:   Refer to the procedure report that was given to you for any specific questions about what was found during the examination.  If the procedure report does not answer your questions, please call your gastroenterologist to clarify.  If you requested that your care partner not be given the details of your procedure findings, then the procedure report has been included in a sealed envelope for you to review at your convenience later.  YOU SHOULD EXPECT: Some feelings of bloating in the abdomen. Passage of more gas than usual.  Walking can help get rid of the air that was put into your GI tract during the procedure and reduce the bloating. If you had a lower endoscopy (such as a colonoscopy or flexible sigmoidoscopy) you may notice spotting of blood in your stool or on the toilet paper. If you underwent a bowel prep for your procedure, you may not have a normal bowel movement for a few days.  Please Note:  You might notice some irritation and congestion in your nose or some drainage.  This is from the oxygen used during your procedure.  There is no need for concern and it should clear up in a day or so.  SYMPTOMS TO REPORT IMMEDIATELY:    Following upper endoscopy (EGD)  Vomiting of blood or coffee ground material  New chest pain or pain under the shoulder blades  Painful or persistently difficult swallowing  New shortness of breath  Fever of 100F or higher  Black, tarry-looking stools  For urgent or emergent issues, a gastroenterologist can be reached at any hour by calling [phone]. Do not use MyChart messaging for urgent concerns.    DIET:  We do recommend  a small meal at first, but then you may proceed to your regular diet.  Drink plenty of fluids but you should avoid alcoholic beverages for 24 hours.  ACTIVITY:  You should plan to take it easy for the rest of today and you should NOT DRIVE or use heavy machinery until tomorrow (because of the sedation medicines used during the test).    FOLLOW UP: Our staff will call the number listed on your records 48-72 hours following your procedure to check on you and address any questions or concerns that you may have regarding the information given to you following your procedure. If we do not reach you, we will leave a message.  We will attempt to reach you two times.  During this call, we will ask if you have developed any symptoms of COVID 19. If you develop any symptoms (ie: fever, flu-like symptoms, shortness of breath, cough etc.) before then, please call [phone].  If you test positive for Covid 19 in the 2 weeks post procedure, please call and report this information to [place].    If any biopsies were taken you will be contacted by phone or by letter within the next 1-3 weeks.  Please call us at [phone] if you have not heard about the biopsies in 3 weeks.    SIGNATURES/CONFIDENTIALITY: You and/or your care partner have signed paperwork which will be entered into your electronic medical record.  These signatures attest to the fact that that the information  above on your After Visit Summary has been reviewed and is understood.  Full responsibility of the confidentiality of this discharge information lies with you and/or your care-partner.

## 2021-06-15 NOTE — Progress Notes (Signed)
Called to room to assist during endoscopic procedure.  Patient ID and intended procedure confirmed with present staff. Received instructions for my participation in the procedure from the performing physician.  

## 2021-06-17 ENCOUNTER — Telehealth: Payer: Self-pay

## 2021-06-17 NOTE — Telephone Encounter (Signed)
°  Follow up Call-  Call back number 06/15/2021  Post procedure Call Back phone  # [phone] Harold Barban - Granddaughter's cell  Permission to leave phone message Yes  Some recent data might be hidden     Patient questions:  Do you have a fever, pain , or abdominal swelling? No. Pain Score  0 *  Have you tolerated food without any problems? Yes.    Have you been able to return to your normal activities? Yes.    Do you have any questions about your discharge instructions: Diet   No. Medications  No. Follow up visit  No.  Do you have questions or concerns about your Care? No.  Actions: * If pain score is 4 or above: No action needed, pain <4.

## 2021-06-20 ENCOUNTER — Encounter: Payer: Self-pay | Admitting: Gastroenterology

## 2021-06-27 ENCOUNTER — Other Ambulatory Visit: Payer: Self-pay

## 2021-06-27 ENCOUNTER — Inpatient Hospital Stay: Payer: Medicare Other

## 2021-06-27 ENCOUNTER — Inpatient Hospital Stay: Payer: Medicare Other | Attending: Hematology

## 2021-06-27 DIAGNOSIS — D638 Anemia in other chronic diseases classified elsewhere: Secondary | ICD-10-CM

## 2021-06-27 LAB — CBC WITH DIFFERENTIAL (CANCER CENTER ONLY)
Abs Immature Granulocytes: 0.02 10*3/uL (ref 0.00–0.07)
Basophils Absolute: 0 10*3/uL (ref 0.0–0.1)
Basophils Relative: 1 %
Eosinophils Absolute: 0.1 10*3/uL (ref 0.0–0.5)
Eosinophils Relative: 3 %
HCT: 34.9 % — ABNORMAL LOW (ref 36.0–46.0)
Hemoglobin: 11.6 g/dL — ABNORMAL LOW (ref 12.0–15.0)
Immature Granulocytes: 1 %
Lymphocytes Relative: 30 %
Lymphs Abs: 1.1 10*3/uL (ref 0.7–4.0)
MCH: 29.5 pg (ref 26.0–34.0)
MCHC: 33.2 g/dL (ref 30.0–36.0)
MCV: 88.8 fL (ref 80.0–100.0)
Monocytes Absolute: 0.3 10*3/uL (ref 0.1–1.0)
Monocytes Relative: 9 %
Neutro Abs: 2 10*3/uL (ref 1.7–7.7)
Neutrophils Relative %: 56 %
Platelet Count: 162 10*3/uL (ref 150–400)
RBC: 3.93 MIL/uL (ref 3.87–5.11)
RDW: 15 % (ref 11.5–15.5)
WBC Count: 3.5 10*3/uL — ABNORMAL LOW (ref 4.0–10.5)
nRBC: 0 % (ref 0.0–0.2)

## 2021-06-27 LAB — FERRITIN: Ferritin: 70 ng/mL (ref 11–307)

## 2021-06-27 NOTE — Progress Notes (Signed)
Patient lab values for HGB were at 11.6 which are above the hold values and patient was informed and injection was held

## 2021-06-29 ENCOUNTER — Ambulatory Visit (INDEPENDENT_AMBULATORY_CARE_PROVIDER_SITE_OTHER): Payer: Medicare Other | Admitting: Internal Medicine

## 2021-06-29 ENCOUNTER — Encounter: Payer: Self-pay | Admitting: Internal Medicine

## 2021-06-29 ENCOUNTER — Other Ambulatory Visit (INDEPENDENT_AMBULATORY_CARE_PROVIDER_SITE_OTHER): Payer: Medicare Other

## 2021-06-29 VITALS — BP 160/60 | HR 57 | Temp 98.8°F | Ht 59.0 in | Wt 150.2 lb

## 2021-06-29 DIAGNOSIS — D638 Anemia in other chronic diseases classified elsewhere: Secondary | ICD-10-CM | POA: Diagnosis not present

## 2021-06-29 DIAGNOSIS — Z79899 Other long term (current) drug therapy: Secondary | ICD-10-CM | POA: Diagnosis not present

## 2021-06-29 DIAGNOSIS — N184 Chronic kidney disease, stage 4 (severe): Secondary | ICD-10-CM

## 2021-06-29 DIAGNOSIS — R7301 Impaired fasting glucose: Secondary | ICD-10-CM

## 2021-06-29 DIAGNOSIS — F411 Generalized anxiety disorder: Secondary | ICD-10-CM

## 2021-06-29 DIAGNOSIS — R0789 Other chest pain: Secondary | ICD-10-CM | POA: Diagnosis not present

## 2021-06-29 DIAGNOSIS — I130 Hypertensive heart and chronic kidney disease with heart failure and stage 1 through stage 4 chronic kidney disease, or unspecified chronic kidney disease: Secondary | ICD-10-CM | POA: Diagnosis not present

## 2021-06-29 DIAGNOSIS — I10 Essential (primary) hypertension: Secondary | ICD-10-CM | POA: Diagnosis not present

## 2021-06-29 LAB — TSH: TSH: 1.6 u[IU]/mL (ref 0.35–5.50)

## 2021-06-29 LAB — T4, FREE: Free T4: 1.01 ng/dL (ref 0.60–1.60)

## 2021-06-29 LAB — HEMOGLOBIN A1C: Hgb A1c MFr Bld: 5.7 % (ref 4.6–6.5)

## 2021-06-29 MED ORDER — LORAZEPAM 0.5 MG PO TABS: 0.5000 mg | ORAL_TABLET | Freq: Two times a day (BID) | ORAL | 0 refills | Status: DC | PRN

## 2021-06-29 NOTE — Progress Notes (Signed)
Chief Complaint  Patient presents with   Follow-up    HPI: [name] 88 y.o. come in for Chronic disease management here with baby daughter. Multiple medical conditions and multiple specialists.  Pulmonary sees Dr. Malvin Johns inhalers and medication seems to be controlling it with no flares recently Anemia of chronic disease is getting infusions again per hematology. Anxiety is taking citalopram 20 mg a day does not think it is controlling anxiety she will get a most daily jitteriness and feels like her heart is beating with anxiety.  Not a full panic attack.  No chest pain shortness of breath otherwise. Renal sees nephrology upcoming appointment medicine may have been changed Cardiology may have change medication to maintain on Cardura.  Overall she is on Cardura hydralazine amlodipine and carvedilol once a day.  No recent blood pressure readings at home needs a new monitor.  Leg edema no significant change Musculoskeletal of course has back and knee a number of musculoskeletal but hand has a new pain in the right chest as for breast exam feels like she got this after the injection.  Tender to touch worries about cancer family history.  May be due for mammogram she states. Needs follow-up of blood sugar to make sure that is not out of range. GI had an endoscopy with biopsy had some slight narrowing of her esophagus did not have H. pylori infection. 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  ROS: See pertinent positives and negatives per HPI.  Past Medical History:  Diagnosis Date   Acute superficial venous thrombosis of left lower extremity 03/27/2014   concern about hx and potential extensive on exam tenderness calf and low dose lovenox 40 qd 2-4 weekselevetion and close  fu advised .    Allergy    Anemia    Anxiety    Arthritis    "shoulders" (09/09/2012)   Asthma 09/08/2012   Carotid bruit    [place] 2018  low risk 1 - 39%   Cataract    bil cateracts removed    Chronic kidney disease    Clotting disorder (HCC)    blood clots in legs   Diabetes mellitus without complication ([place])    "Borderline" per pt; being monitored.  no meds per pt   Diverticulosis    Dyspnea 04/27/2014   GERD (gastroesophageal reflux disease)    Headache(784.0)    "related to my high blood pressure" (09/09/2012)   Hiatal hernia    History of DVT (deep vein thrombosis)    "RLE" (09/09/2012) coumadine cant take asa so on plavix    HTN (hypertension) 09/08/2012   Hyperlipidemia    Lupus ([place])    "cured years ago" (09/09/2012)   Other dysphagia 02/11/2013   Syncope and collapse 01/21/2018   Varicose veins    Varicose veins of leg with complications 09/38/1829    Family History  Problem Relation Age of Onset   Hypertension Mother    Lung cancer Mother    Hypertension Father    Lung cancer Father    Breast cancer Sister    Colon cancer Sister        ? 22' s dx - died in 33's   Breast cancer Sister    Hypertension Brother    Rectal cancer Brother    Stomach cancer Brother    Breast cancer Brother    Heart attack Daughter    Esophageal cancer Daughter    Hypertension Daughter    Diabetes Daughter  Breast cancer Paternal Aunt    Lung cancer Other        both parents    Pancreatic cancer Neg Hx     Social History   Socioeconomic History   Marital status: Single    Spouse name: Not on file   Number of children: 6   Years of education: Not on file   Highest education level: Not on file  Occupational History   Occupation: retired  Tobacco Use   Smoking status: Never   Smokeless tobacco: Never  Vaping Use   Vaping Use: Never used  Substance and Sexual Activity   Alcohol use: No   Drug use: No   Sexual activity: Not Currently    Birth control/protection: Surgical  Other Topics Concern   Not on file  Social History Narrative   2 people living in the home.  Grand daughter.   Up and down through the night      Had 7 children  2 deceased . 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    works 3 days per week currently .   Glasses dentures  Neg tad    Social Determinants of Health   Financial Resource Strain: Not on file  Food Insecurity: Not on file  Transportation Needs: Not on file  Physical Activity: Not on file  Stress: Not on file  Social Connections: Not on file    Outpatient Medications Prior to Visit  Medication Sig Dispense Refill   albuterol (PROVENTIL) (2.5 MG/3ML) 0.083% nebulizer solution USE 1 AMPULE EVERY 6 HOURS AS NEEDED FOR WHEEZE 75 mL 1   albuterol (VENTOLIN HFA) 108 (90 Base) MCG/ACT inhaler USE 2 PUFFS EVERY 6 HOURS AS NEEDED FOR WHEEZING. 8.5 g 3   amLODipine (NORVASC) 5 MG tablet TAKE 1 TABLET BY MOUTH DAILY. 90 tablet 3   atorvastatin (LIPITOR) 20 MG tablet TAKE ONE TABLET BY MOUTH DAILY. 90 tablet 1   azaTHIOprine (IMURAN) 50 MG tablet Take 25 mg by mouth daily.     Azelastine-Fluticasone (DYMISTA) 137-50 MCG/ACT SUSP Place 1 spray into the nose in the morning and at bedtime. 23 g 6   B Complex Vitamins (VITAMIN B COMPLEX) TABS Take 1 tablet by mouth daily.     Budeson-Glycopyrrol-Formoterol (BREZTRI AEROSPHERE) 160-9-4.8 MCG/ACT AERO INHALE 2 PUFFS INTO THE LUNGS TWICE DAILY, IN THE MORNING & AT BEDTIME. 10.7 g 6   carvedilol (COREG) 6.25 MG tablet Take 6.25 mg by mouth daily.     citalopram (CELEXA) 20 MG tablet Take 1 tablet (20 mg total) by mouth daily. 30 tablet 0   clopidogrel (PLAVIX) 75 MG tablet TAKE ONE TABLET BY MOUTH ONCE DAILY. 30 tablet 1   diclofenac Sodium (VOLTAREN) 1 % GEL Apply 4 g topically 4 (four) times daily. 100 g 6   doxazosin (CARDURA) 2 MG tablet TAKE THREE TABLETS BY MOUTH AT BEDTIME 270 tablet 1   ferrous sulfate 325 (65 FE) MG EC tablet Take 1 tablet (325 mg total) by mouth daily with breakfast. 30 tablet 3   fluorometholone (FML) 0.1 % ophthalmic suspension Place 1 drop into both eyes 2 (two) times  daily.      fluticasone (FLONASE) 50 MCG/ACT nasal spray Place 2 sprays into both nostrils daily. 16 g 2   furosemide (LASIX) 40 MG tablet Take by mouth. Take 80 mg twice for 2-3 days, then decrease to 80 mg in  the am and 40 mg in the pm     hydrALAZINE (APRESOLINE) 50 MG tablet Take 1.5 tablets (75 mg total) by mouth 3 (three) times daily. 405 tablet 3   montelukast (SINGULAIR) 10 MG tablet TAKE 1 TABLET ONCE DAILY. 90 tablet 1   pantoprazole (PROTONIX) 40 MG tablet Take 1 tablet (40 mg total) by mouth 2 (two) times daily. 180 tablet 1   potassium chloride (KLOR-CON) 10 MEQ tablet TAKE FOUR TABLETS BY MOUTH DAILY. 120 tablet 3   prednisoLONE acetate (PRED FORTE) 1 % ophthalmic suspension 1 drop 2 (two) times daily.     Spacer/Aero-Holding Chambers (AEROCHAMBER PLUS) inhaler Use as instructed 1 each 2   vitamin B-12 (CYANOCOBALAMIN) 1000 MCG tablet Take 1,000 mcg by mouth daily.     Vitamin D, Cholecalciferol, 25 MCG (1000 UT) TABS Take 1,000 Units by mouth daily.     No facility-administered medications prior to visit.     EXAM:  BP (!) 160/60 (BP Location: Left Arm)    Pulse (!) 57    Temp 98.8 F (37.1 C) (Oral)    Ht 4\' 11"  (1.499 m)    Wt 150 lb 3.2 oz (68.1 kg)    SpO2 98%    BMI 30.34 kg/m   Body mass index is 30.34 kg/m.  GENERAL: vitals reviewed and listed above, alert, oriented, appears well hydrated and in no acute distress HEENT: atraumatic, conjunctiva mildly muddy clear, no obvious abnormalities on inspection of external nose and ears OP : Masked NECK: no obvious masses on inspection palpation  Breast exam sitting no nodules or discharge area of concern which may be tenderness appears to be chest wall right upper rib cage but no masses noted negative squeeze test.  No skin changes. LUNGS: clear to auscultation bilaterally, no wheezes, rales or rhonchi, good air movement CV: HRRR, no clubbing cyanosis edema +2 appears to be stable in compression stockings. MS: moves all  extremities without noticeable focal  abnormality PSYCH: pleasant and cooperative, no obvious depression or anxiety Lab Results  Component Value Date   WBC 3.5 (L) 06/27/2021   HGB 11.6 (L) 06/27/2021   HCT 34.9 (L) 06/27/2021   PLT 162 06/27/2021   GLUCOSE 98 06/06/2021   CHOL 198 07/21/2020   TRIG 132.0 07/21/2020   HDL 66.40 07/21/2020   LDLCALC 106 (H) 07/21/2020   ALT 5 06/06/2021   AST 17 06/06/2021   NA 143 06/06/2021   K 3.5 06/06/2021   CL 105 06/06/2021   CREATININE 2.28 (H) 06/06/2021   BUN 38 (H) 06/06/2021   CO2 31 06/06/2021   TSH 2.34 09/19/2019   INR 0.94 12/22/2009   HGBA1C 5.7 01/27/2021   MICROALBUR 119 03/02/2021   BP Readings from Last 3 Encounters:  06/29/21 (!) 160/60  06/15/21 (!) 164/48  06/06/21 (!) 174/60    ASSESSMENT AND PLAN:  Discussed the following assessment and plan:  Anxiety state - Plan: TSH, T4, free, Lipid panel, Hemoglobin Z6X, Basic metabolic panel  Medication management - Plan: TSH, T4, free, Lipid panel, Hemoglobin W9U, Basic metabolic panel  Fasting hyperglycemia - Plan: TSH, T4, free, Lipid panel, Hemoglobin E4V, Basic metabolic panel  Right-sided chest wall pain  Essential hypertension - Plan: TSH, T4, free, Lipid panel, Hemoglobin W0J, Basic metabolic panel  Chronic kidney disease (CKD), stage IV (severe) (HCC) - Plan: TSH, T4, free, Lipid panel, Hemoglobin W1X, Basic metabolic panel  Anemia, chronic disease  Hypertensive heart disease with congestive heart failure and chronic kidney disease,  unspecified CKD stage, unspecified heart failure type ([place]) Discussion about jitteriness anxiety symptoms consideration of under lying arrhythmia but doubt on as she states it feels like anxiety to her.  Check thyroid tests today updated lipids blood sugar nonfasting continue citalopram at this time we will add a small amount of a benzo diazepam with reluctance but perhaps we will control her symptoms today as opposed to changing  the SSRI. Other options would be hydroxyzine which may cause drowsiness and other side effects or adding BuSpar which may be too high of a serotonin boost. Uncertain blood pressure control will opine to cardiology and nephrology about blood pressure medicines and control. Plan follow-up in 2 to 3 months or as indicated by status or lab. -Patient advised to return or notify health care team  if  new concerns arise. Record review's individual problem assessment medicationcounseling plan 40 minutes Patient Instructions  Good to see you today . Stay on the citalopram  and we can add as needed  anxiety medication with caution.  Will opine to cardiology and  nephrology about the BP meds and control . Glad the asthma is stable .   Le [place] know how  medication doing   and rov in 2-3 months    Izell Labat K. Lennell Shanks M.D.

## 2021-06-29 NOTE — Patient Instructions (Signed)
Good to see you today . Stay on the citalopram  and we can add as needed  anxiety medication with caution.  Will opine to cardiology and  nephrology about the BP meds and control . Glad the asthma is stable .   [place] know how  medication doing   and rov in 2-3 months

## 2021-06-30 ENCOUNTER — Other Ambulatory Visit: Payer: Medicare Other

## 2021-06-30 ENCOUNTER — Telehealth: Payer: Self-pay | Admitting: Pharmacist

## 2021-06-30 LAB — BASIC METABOLIC PANEL
BUN: 50 mg/dL — ABNORMAL HIGH (ref 6–23)
CO2: 25 mEq/L (ref 19–32)
Calcium: 9.4 mg/dL (ref 8.4–10.5)
Chloride: 106 mEq/L (ref 96–112)
Creatinine, Ser: 2.27 mg/dL — ABNORMAL HIGH (ref 0.40–1.20)
GFR: 19.22 mL/min — ABNORMAL LOW (ref >60.00)
Glucose, Bld: 120 mg/dL — ABNORMAL HIGH (ref 70–99)
Potassium: 3.6 mEq/L (ref 3.5–5.1)
Sodium: 141 mEq/L (ref 135–145)

## 2021-06-30 LAB — LIPID PANEL
Cholesterol: 231 mg/dL — ABNORMAL HIGH (ref 0–200)
HDL: 78.9 mg/dL (ref >39.00)
LDL Cholesterol: 135 mg/dL — ABNORMAL HIGH (ref 0–99)
NonHDL: 151.84
Total CHOL/HDL Ratio: 3
Triglycerides: 82 mg/dL (ref 0.0–149.0)
VLDL: 16.4 mg/dL (ref 0.0–40.0)

## 2021-06-30 NOTE — Chronic Care Management (AMB) (Signed)
° ° °  Chronic Care Management Pharmacy Assistant   Name: [name]  MRN: [id number] DOB: 02/01/1934  07/04/2021 APPOINTMENT REMINDER   Called [name], No answer, left message with patients granddaughter [name] of appointment on 07/04/2021 at 1:00 via office visit with [name], Pharm D. Notified to have all medications, supplements, blood pressure and/or blood sugar logs available during appointment and to return call if need to reschedule.  Care Gaps: AWV - message sent to Ramond Craver Last BP - 160/60 on 06/29/2021 Last A1C - 5.7 on 06/29/2021  Star Rating Drug: Atorvastatin 20mg  - last filled on 03/22/2021 90DS at [facility] verified with Ronalee Belts  Any gaps in medications fill history? Yes  [place] Pharmacist Assistant [phone]

## 2021-07-04 ENCOUNTER — Ambulatory Visit (INDEPENDENT_AMBULATORY_CARE_PROVIDER_SITE_OTHER): Payer: Medicare Other | Admitting: Pharmacist

## 2021-07-04 VITALS — BP 160/78

## 2021-07-04 DIAGNOSIS — J45909 Unspecified asthma, uncomplicated: Secondary | ICD-10-CM

## 2021-07-04 DIAGNOSIS — I130 Hypertensive heart and chronic kidney disease with heart failure and stage 1 through stage 4 chronic kidney disease, or unspecified chronic kidney disease: Secondary | ICD-10-CM

## 2021-07-04 NOTE — Progress Notes (Signed)
Chronic Care Management Pharmacy Note  07/06/2021 Name:  [name] MRN:  [id number] DOB:  06-Apr-1934  Summary: BP not at goal < 140/90 per office readings Pt reports anxiety is not improving  Recommendations/Changes made from today's visit: -Recommended checking BP at home twice weekly -Recommended switching carvedilol 6.25 mg daily to metoprolol succinate 25 mg daily due to asthma and current SOB -Applied for PAP for Breztri and patient was approved -Recommend switching citalopram to sertraline -Recommended bringing BP cuff to next office visit  Plan: Follow up BP assessment in 4 weeks  Subjective: [name] is an 88 y.o. year old female who is a primary patient of Panosh, Standley Brooking, MD.  The CCM team was consulted for assistance with disease management and care coordination needs.    Engaged with patient face to face for follow up visit in response to provider referral for pharmacy case management and/or care coordination services.   Consent to Services:  The patient was given information about Chronic Care Management services, agreed to services, and gave verbal consent prior to initiation of services.  Please see initial visit note for detailed documentation.   Patient Care Team: Panosh, Standley Brooking, MD as PCP - General (Internal Medicine) Nahser, Wonda Cheng, MD as PCP - Cardiology (Cardiology) Angela Sa Tonna Corner, MD (Orthopedic Surgery) Angela Merle, MD as Consulting Physician (Hematology) Angela Lips, MD as Consulting Physician (Nephrology) Angela Fam, MD as Consulting Physician (Ophthalmology) [name], [facility] as Pharmacist (Pharmacist)  Recent office visits: 06/29/21 Angela Ace, MD: Patient presented for anxiety follow up. BP elevated in office. Prescribed lorazepam 0.5 mg BID as needed.  01/27/21 Angela Ace MD (PCP) - seen for anemia and other chronic conditions. Referral placed for hematology/oncology. Patient started on diclofenac sodium 4g  4 times daily. Follow up in 4-6 months depending on results.  Recent consult visits: 06/07/21 Angela Melena, MD (ophthalmology): Patient presented for follow up eye exam. Prescribed azathioprine 50 mg 1/2 tablet daily.  06/06/21 Cancer center: Patient presented for Aranesp injection.  05/17/21 Wilfrid Lund, MD (gastroenterology): Patient presented for abdominal pain. Plan for endoscopy.  04/25/21 Angela Merle, MD (oncology): Patient presented for anemia follow up and Aranesp infusion. Plan to spread her injections to every 2 weeks.  03/29/2021 Angela Melena MD (opthamology) - Patient was seen for retina evaluation. Discontinued Imuran 3m. No follow up noted.   03/24/2021 YTruitt MerleMD (oncology) - Patient was seen for anemia of chronic disease. No medication changes. Follow up in 4 weeks.  03/21/21 PMertie MooresMD (Cardiology) - seen for Hypertensive heart disease with congestive heart failure and chronic kidney disease, unspecified CKD stage and unspecified heart failure type. No medication changes. Follow up in 1 year.   02/24/2021 YTruitt MerleMD (oncology) - Patient was seen for anemia of chronic disease. Plan for Aranesp in 4 weeks.  02/08/21 RDwana MelenaMD ([facility] - seen for initial consult for panuveitis of both eyes, hypertensive retinopathy and retinal edema. No medication changes or follow up noted.    01/12/21 RBaltazar ApoMD (Pulmonary Disease) - seen for chronic ashtma without complication. referral placed for DME. No medication changes. Follow up in 12 months.  07/01/20 EMadelon Lopez(nephrology): Unable to access notes.  Hospital visits: None in previous 6 months   Objective:  Lab Results  Component Value Date   CREATININE 2.27 (H) 06/29/2021   BUN 50 (H) 06/29/2021   GFR 19.22 (L) 06/29/2021   GFRNONAA 21 (L) 06/06/2021   GFRAA  19 11/04/2020   NA 141 06/29/2021   K 3.6 06/29/2021   CALCIUM 9.4 06/29/2021   CO2 25 06/29/2021   GLUCOSE 120 (H) 06/29/2021    Lab Results   Component Value Date/Time   HGBA1C 5.7 06/29/2021 02:15 PM   HGBA1C 5.7 01/27/2021 11:57 AM   GFR 19.22 (L) 06/29/2021 02:15 PM   GFR 16.08 (L) 01/27/2021 11:57 AM   MICROALBUR 119 03/02/2021 12:00 AM   MICROALBUR 103.8 11/04/2020 12:00 AM    Last diabetic Eye exam:  Lab Results  Component Value Date/Time   HMDIABEYEEXA Retinopathy (A) 06/10/2020 12:00 AM    Last diabetic Foot exam: No results found for: HMDIABFOOTEX   Lab Results  Component Value Date   CHOL 231 (H) 06/29/2021   HDL 78.90 06/29/2021   LDLCALC 135 (H) 06/29/2021   TRIG 82.0 06/29/2021   CHOLHDL 3 06/29/2021    Hepatic Function Latest Ref Rng & Units 06/06/2021 05/17/2021 04/25/2021  Total Protein 6.5 - 8.1 g/dL 6.0(L) 6.3(L) 6.3(L)  Albumin 3.5 - 5.0 g/dL 3.6 3.6 3.4(L)  AST 15 - 41 U/L 17 18 17   ALT 0 - 44 U/L 5 6 6   Alk Phosphatase 38 - 126 U/L 33(L) 38 40  Total Bilirubin 0.3 - 1.2 mg/dL 0.4 0.5 0.6  Bilirubin, Direct 0.0 - 0.3 mg/dL - - -    Lab Results  Component Value Date/Time   TSH 1.60 06/29/2021 02:15 PM   TSH 2.34 09/19/2019 11:45 AM   FREET4 1.01 06/29/2021 02:15 PM   FREET4 0.74 07/09/2017 03:57 PM    CBC Latest Ref Rng & Units 06/27/2021 06/06/2021 05/17/2021  WBC 4.0 - 10.5 K/uL 3.5(L) 3.3(L) 3.2(L)  Hemoglobin 12.0 - 15.0 g/dL 11.6(L) 10.6(L) 11.6(L)  Hematocrit 36.0 - 46.0 % 34.9(L) 33.7(L) 37.2  Platelets 150 - 400 K/uL 162 160 149(L)    Lab Results  Component Value Date/Time   VD25OH 32.77 07/09/2017 03:57 PM    Clinical ASCVD: No  The ASCVD Risk score (Arnett DK, et al., 2019) failed to calculate for the following reasons:   The 2019 ASCVD risk score is only valid for ages 43 to 59    Depression screen PHQ 2/9 07/21/2020 05/21/2019 03/25/2018  Decreased Interest 0 0 0  Down, Depressed, Hopeless 0 0 0  PHQ - 2 Score 0 0 0  Altered sleeping 0 - -  Tired, decreased energy 1 - -  Change in appetite 0 - -  Feeling bad or failure about yourself  0 - -  Trouble concentrating 0 - -   Moving slowly or fidgety/restless 0 - -  Suicidal thoughts 0 - -  PHQ-9 Score 1 - -  Difficult doing work/chores Not difficult at all - -  Some recent data might be hidden     Social History   Tobacco Use  Smoking Status Never  Smokeless Tobacco Never   BP Readings from Last 3 Encounters:  06/29/21 (!) 160/60  06/15/21 (!) 164/48  06/06/21 (!) 174/60   Pulse Readings from Last 3 Encounters:  06/29/21 (!) 57  06/15/21 (!) 46  06/06/21 76   Wt Readings from Last 3 Encounters:  06/29/21 150 lb 3.2 oz (68.1 kg)  06/15/21 153 lb (69.4 kg)  05/17/21 153 lb 4 oz (69.5 kg)   BMI Readings from Last 3 Encounters:  06/29/21 30.34 kg/m  06/15/21 30.90 kg/m  05/17/21 30.95 kg/m    Assessment/Interventions: Review of patient past medical history, allergies, medications, health status, including review of  consultants reports, laboratory and other test data, was performed as part of comprehensive evaluation and provision of chronic care management services.   SDOH:  (Social Determinants of Health) assessments and interventions performed: No  SDOH Screenings   Alcohol Screen: Not on file  Depression (PHQ2-9): Low Risk    PHQ-2 Score: 1  Financial Resource Strain: Not on file  Food Insecurity: Not on file  Housing: Not on file  Physical Activity: Not on file  Social Connections: Not on file  Stress: Not on file  Tobacco Use: Low Risk    Smoking Tobacco Use: Never   Smokeless Tobacco Use: Never   Passive Exposure: Not on file  Transportation Needs: Not on file    CCM Care Plan  Allergies  Allergen Reactions   Penicillins Nausea And Vomiting and Other (See Comments)    Has patient had a PCN reaction causing immediate rash, facial/tongue/throat swelling, SOB or lightheadedness with hypotension: Y Has patient had a PCN reaction causing severe rash involving mucus membranes or skin necrosis: Y Has patient had a PCN reaction that required hospitalization: N Has patient  had a PCN reaction occurring within the last 10 years: N If all of the above answers are "NO", then may proceed with Cephalosporin use.    Aspirin Other (See Comments)    Wheezing Acetaminophen is OK    Medications Reviewed Today     Reviewed by Burnis Medin, MD (Physician) on 06/29/21 at [place] List Status: <None>   Medication Order Taking? Sig Documenting Provider Last Dose Status Informant  albuterol (PROVENTIL) (2.5 MG/3ML) 0.083% nebulizer solution [id number] Yes USE 1 AMPULE EVERY 6 HOURS AS NEEDED FOR WHEEZE Byrum, Rose Fillers, MD Taking Active   albuterol (VENTOLIN HFA) 108 (90 Base) MCG/ACT inhaler [id number] Yes USE 2 PUFFS EVERY 6 HOURS AS NEEDED FOR WHEEZING. Collene Gobble, MD Taking Active   amLODipine (NORVASC) 5 MG tablet [id number] Yes TAKE 1 TABLET BY MOUTH DAILY. Nahser, Wonda Cheng, MD Taking Active   atorvastatin (LIPITOR) 20 MG tablet [id number] Yes TAKE ONE TABLET BY MOUTH DAILY. Panosh, Standley Brooking, MD Taking Active   azaTHIOprine (IMURAN) 50 MG tablet [id number] Yes Take 25 mg by mouth daily. [provider] Taking Active   Azelastine-Fluticasone [facility]) 137-50 MCG/ACT SUSP [id number] Yes Place 1 spray into the nose in the morning and at bedtime. Collene Gobble, MD Taking Active   B Complex Vitamins (VITAMIN B COMPLEX) TABS [id number] Yes Take 1 tablet by mouth daily. [provider] Taking Active   Budeson-Glycopyrrol-Formoterol (BREZTRI AEROSPHERE) 160-9-4.8 MCG/ACT AERO [id number] Yes INHALE 2 PUFFS INTO THE LUNGS TWICE DAILY, IN THE MORNING & AT BEDTIME. Collene Gobble, MD Taking Active   carvedilol (COREG) 6.25 MG tablet [id number] Yes Take 6.25 mg by mouth daily. [provider] Taking Active   citalopram (CELEXA) 20 MG tablet [id number] Yes Take 1 tablet (20 mg total) by mouth daily. Panosh, Standley Brooking, MD Taking Active   clopidogrel (PLAVIX) 75 MG tablet [id number] Yes TAKE ONE TABLET BY MOUTH ONCE DAILY. Panosh, Standley Brooking, MD Taking Active    diclofenac Sodium (VOLTAREN) 1 % GEL [id number] Yes Apply 4 g topically 4 (four) times daily. Panosh, Standley Brooking, MD Taking Active   doxazosin (CARDURA) 2 MG tablet [id number] Yes TAKE THREE TABLETS BY MOUTH AT BEDTIME Panosh, Standley Brooking, MD Taking Active   ferrous sulfate 325 (65 FE) MG EC tablet [id number] Yes Take 1 tablet (325 mg total) by mouth daily with breakfast. Panosh,  Standley Brooking, MD Taking Active Child  fluorometholone (FML) 0.1 % ophthalmic suspension [id number] Yes Place 1 drop into both eyes 2 (two) times daily.  [provider] Taking Active Child  fluticasone (FLONASE) 50 MCG/ACT nasal spray [id number] Yes Place 2 sprays into both nostrils daily. Collene Gobble, MD Taking Active   furosemide (LASIX) 40 MG tablet [id number] Yes Take by mouth. Take 80 mg twice for 2-3 days, then decrease to 80 mg in the am and 40 mg in the pm [provider] Taking Active   hydrALAZINE (APRESOLINE) 50 MG tablet [id number] Yes Take 1.5 tablets (75 mg total) by mouth 3 (three) times daily. Nahser, Wonda Cheng, MD Taking Active   LORazepam (ATIVAN) 0.5 MG tablet [id number] Yes Take 1 tablet (0.5 mg total) by mouth 2 (two) times daily as needed for anxiety. Panosh, Standley Brooking, MD  Active   montelukast (SINGULAIR) 10 MG tablet [id number] Yes TAKE 1 TABLET ONCE DAILY. Eulas Post, MD Taking Active   pantoprazole (PROTONIX) 40 MG tablet [id number] Yes Take 1 tablet (40 mg total) by mouth 2 (two) times daily. Panosh, Standley Brooking, MD Taking Active   potassium chloride (KLOR-CON) 10 MEQ tablet [id number] Yes TAKE FOUR TABLETS BY MOUTH DAILY. Panosh, Standley Brooking, MD Taking Active   prednisoLONE acetate (PRED FORTE) 1 % ophthalmic suspension [id number] Yes 1 drop 2 (two) times daily. [provider] Taking Active   Spacer/Aero-Holding Chambers (AEROCHAMBER PLUS) inhaler [id number] Yes Use as instructed Melynda Ripple, MD Taking Active Child  vitamin B-12 (CYANOCOBALAMIN) 1000 MCG tablet [id number] Yes Take 1,000 mcg  by mouth daily. [provider] Taking Active Child  Vitamin D, Cholecalciferol, 25 MCG (1000 UT) TABS [id number] Yes Take 1,000 Units by mouth daily. [provider] Taking Active             Patient Active Problem List   Diagnosis Date Noted   Chronic rhinitis 11/24/2019   Nausea 11/03/2018   Elevated troponin 11/03/2018   Hyponatremia 11/03/2018   Hypertensive heart disease with hypertensive chronic kidney disease 11/30/2017   Fasting hyperglycemia 11/30/2017   Renal cyst 03/27/2016   CKD (chronic kidney disease) stage 3, GFR 30-59 ml/min (HCC) 08/23/2015   Perceived hearing changes 06/29/2014   Resistant hypertension 06/29/2014   Lumbago 06/15/2014   Pre-diabetes vs early DM  06/15/2014   Dyspnea 04/27/2014   Asthma, chronic 04/13/2014   Elevated uric acid in blood 04/13/2014   Essential hypertension 03/27/2014   History of DVT (deep vein thrombosis)    Palpitations 01/05/2014   Anemia of chronic disease 12-20-13   Death of family member 2013-08-24   Leg edema 06/20/2013   Bereavement due to life event 04/21/2013   Other dysphagia 02/11/2013   Colon cancer screening 02/11/2013   Anxiety state 01/20/2013   Leg cramps 01/20/2013   Back pain 12/05/2012   Family hx of colon cancer 10/21/2012   Family hx of lung cancer 10/21/2012   Family hx-breast malignancy 10/21/2012   Renal insufficiency creatinine 1.3 10/21/2012   Anemia, chronic disease 10/21/2012   GERD (gastroesophageal reflux disease) 09/08/2012   HTN (hypertension) 09/08/2012   Hyperlipidemia 09/08/2012   Varicose veins of leg with complications 01/77/9390    Immunization History  Administered Date(s) Administered   Fluad Quad(high Dose 65+) 01/17/2019, 02/16/2020, 01/27/2021   Influenza, High Dose Seasonal PF 02/17/2014, 03/01/2015, 02/21/2016, 03/12/2017, 01/31/2018   Influenza,inj,Quad PF,6+ Mos 01/20/2013   Moderna Sars-Covid-2 Vaccination 04/29/2020   PFIZER(Purple Top)SARS-COV-2  Vaccination 06/07/2019, 06/28/2019, 09/10/2020  Pneumococcal Conjugate-13 02/21/2016   Pneumococcal Polysaccharide-23 03/10/2020   Pneumococcal-Unspecified 01/13/2009   Td 04/03/2014   Unspecified SARS-COV-2 Vaccination 04/01/2021   Zoster Recombinat (Shingrix) 02/12/2019, 04/18/2019   Patient reports she doesn't feel like the citalopram is helping anymore. She hasn't tried anything else as far as anxiety goes. She doesn't report any issues with sleep other than following an abnormal sleep schedule as she stays up pretty late. She still feels jittery some and is trying not to use the as needed medication every day.  Conditions to be addressed/monitored:  Hypertension, Hyperlipidemia, GERD, Asthma, Chronic Kidney Disease, Anxiety, Allergic Rhinitis, and Pre-diabetes  Conditions addressed this visit: Anxiety, hypertension, asthma  Care Plan : CCM Pharmacy Care Plan  Updates made by [name], [place] since 07/06/2021 12:00 AM     Problem: Problem: Hypertension, Hyperlipidemia, GERD, Asthma, Chronic Kidney Disease, Anxiety, Allergic Rhinitis, and Pre-diabetes      Long-Range Goal: Patient-Specific Goal   Start Date: 11/30/2020  Expected End Date: 11/30/2021  Recent Progress: On track  Priority: High  Note:   Current Barriers:  Unable to independently monitor therapeutic efficacy Unable to achieve control of blood pressure   Pharmacist Clinical Goal(s):  Patient will achieve adherence to monitoring guidelines and medication adherence to achieve therapeutic efficacy achieve control of blood pressure as evidenced by home readings and office readings  through collaboration with PharmD and provider.   Interventions: 1:1 collaboration with Panosh, Standley Brooking, MD regarding development and update of comprehensive plan of care as evidenced by provider attestation and co-signature Inter-disciplinary care team collaboration (see longitudinal plan of care) Comprehensive medication review  performed; medication list updated in electronic medical record  Hypertension (BP goal <140/90) -Uncontrolled -Current treatment: Amlodipine 5 mg 1 tablet daily - in AM - Appropriate, Query effective, Safe, Accessible Doxazosin 2 mg 3 tablets at bedtime - in PM - Appropriate, Query effective, Query Safe, Accessible Carvedilol 6.25 mg daily - in AM - Query Appropriate, Query effective, Query Safe, Accessible Hydralazine 50 mg 1.5 tablets three times daily - Appropriate, Query effective, Safe, Accessible Furosemide 40 mg 2 tablets in AM and 1 tablet in PM - Appropriate, Query effective, Safe, Accessible -Medications previously tried: unknown  -Current home readings: 180s (in AM and PM) - pain is pretty constant; might need a new cuff, arm cuff -Current dietary habits: puts a little bit of salt in food - used when cooking  -Current exercise habits: limited -Denies hypotensive/hypertensive symptoms -Educated on BP goals and benefits of medications for prevention of heart attack, stroke and kidney damage; Exercise goal of 150 minutes per week; Importance of home blood pressure monitoring; Proper BP monitoring technique; -Counseled to monitor BP at home twice a week, document, and provide log at future appointments -Counseled on diet and exercise extensively Recommended to continue current medication Recommended switching carvedilol to metoprolol due to asthma.  Hyperlipidemia: (LDL goal < 100) -Uncontrolled -Current treatment: Atorvastatin 20 mg 1 tablet daily - Appropriate, Query effective, Safe, Accessible -Medications previously tried: none  -Current dietary patterns: did not discuss -Current exercise habits: limited with SOB -Educated on Cholesterol goals;  -Recommended to continue current medication  Diabetes (A1c goal <6.5%) -Controlled -Current medications: No medications -Medications previously tried: none  -Current home glucose readings fasting glucose: does not need to  check post prandial glucose: does not need to check -Denies hypoglycemic/hyperglycemic symptoms -Current meal patterns:  breakfast: did not discuss  lunch: did not discuss   dinner: did not discuss  snacks: did not  discuss  drinks: did not discuss  -Current exercise: limited due to SOB -Educated on A1c and blood sugar goals; Carbohydrate counting and/or plate method -Counseled to check feet daily and get yearly eye exams -Counseled on diet and exercise extensively  Asthma (Goal: control symptoms) -Uncontrolled -Current treatment  Breztri 160-9-4.8 mcg/act 2 puffs twice daily - Appropriate, Effective, Safe, Accessible Albuterol HFA as needed - Appropriate, Effective, Safe, Accessible Albuterol nebulizer as needed - Appropriate, Effective, Safe, Accessible Montelukast 10 mg 1 tablet daily - Appropriate, Effective, Safe, Accessible -Medications previously tried: n/a  -Pulmonary function testing: FEV1 66% (11/26/17) -Exacerbations requiring treatment in last 6 months: none -Patient reports consistent use of maintenance inhaler -Frequency of rescue inhaler use: more frequently; every morning -Counseled on Proper inhaler technique; Benefits of consistent maintenance inhaler use When to use rescue inhaler -Recommended to continue current medication Assessed patient finances. Applied for [employer] patient assistance and was approved.  Depression/Anxiety (Goal: minimize symptoms) -Not ideally controlled -Current treatment: Citalopram 20 mg 1 tablet daily - Appropriate, Query effective, Safe, Accessible Lorazepam 1 mg 1 tablet every 8 hours as needed - Appropriate, Effective, Query Safe, Accessible -Medications previously tried/failed: none -PHQ9: 1 -GAD7: 3 -Educated on Benefits of medication for symptom control -Recommended to continue current medication Counseled on long term risks of taking benzodiazepines and limiting use. Recommended switching citalopram to alternative  SSRI.  Allergic rhinitis (Goal: minimize symptoms) -Controlled -Current treatment  Loratadine 10 mg 1 tablet daily as needed - Appropriate, Effective, Safe, Accessible Fluticasone 50 mcg/act 2 sprays in nostrils daily - Appropriate, Effective, Safe, Accessible -Medications previously tried: Dymista  -Recommended to continue current medication  GERD (Goal: minimize symptoms) -Controlled -Current treatment  Pantoprazole 40 mg 1 tablet twice daily before meals - Appropriate, Effective, Query Safe, Accessible -Medications previously tried: none  -Recommended to continue current medication  History of DVT (Goal: prevent blood clots) -Controlled -Current treatment  Clopidogrel 75 mg 1 tablet daily - Query Appropriate, Effective, Safe, Accessible -Medications previously tried: none  - Reassess the need at follow up.   Health Maintenance -Vaccine gaps: COVID booster -Current therapy:  Potassium chloride 10 mEq 4 tablets daily  Diclofenac gel 1% as needed Ferrous sulfate 325 mg 1 tablet daily with breakfast Vitamin B12 1000 mcg daily Vitamin D 1000 units daily -Educated on Cost vs benefit of each product must be carefully weighed by individual consumer -Patient is satisfied with current therapy and denies issues -Recommended to continue current medication  Patient Goals/Self-Care Activities Patient will:  - take medications as prescribed check blood pressure twice weekly, document, and provide at future appointments target a minimum of 150 minutes of moderate intensity exercise weekly  Follow Up Plan: Face to Face appointment with care management team member scheduled for:  6 months     Medication Assistance: None required.  Patient affirms current coverage meets needs.  Compliance/Adherence/Medication fill history: Care Gaps: DEXA, foot exam, COVID booster Last BP - 160/60 on 06/29/2021 Last A1C - 5.7 on 06/29/2021  Star-Rating Drugs: Atorvastatin 31m - [facility]  [id number]: [phone]: [phone]  Uses pill box? Yes Pt endorses 95% compliance  We discussed: Benefits of medication synchronization, packaging and delivery as well as enhanced pharmacist oversight with [employer]. Patient decided to: Continue current medication management strategy  Care Plan and Follow Up Patient Decision:  Patient agrees to Care Plan and Follow-up.  Plan: The care management team will reach out to the patient again over the next 30 days.  [name], PharmD, [place] Pharmacist [place] at [place]

## 2021-07-06 DIAGNOSIS — J45909 Unspecified asthma, uncomplicated: Secondary | ICD-10-CM | POA: Diagnosis not present

## 2021-07-06 DIAGNOSIS — N184 Chronic kidney disease, stage 4 (severe): Secondary | ICD-10-CM | POA: Diagnosis not present

## 2021-07-06 DIAGNOSIS — N2581 Secondary hyperparathyroidism of renal origin: Secondary | ICD-10-CM | POA: Diagnosis not present

## 2021-07-06 DIAGNOSIS — Z86718 Personal history of other venous thrombosis and embolism: Secondary | ICD-10-CM | POA: Diagnosis not present

## 2021-07-06 DIAGNOSIS — R1013 Epigastric pain: Secondary | ICD-10-CM | POA: Diagnosis not present

## 2021-07-06 DIAGNOSIS — I129 Hypertensive chronic kidney disease with stage 1 through stage 4 chronic kidney disease, or unspecified chronic kidney disease: Secondary | ICD-10-CM | POA: Diagnosis not present

## 2021-07-06 NOTE — Patient Instructions (Signed)
Hi [place] and [place],  It was great to see you again! I will call you on Wednesday to go through your medications.  Please reach out to me if you have any questions or need anything!  Best, Maddie  [name], PharmD, [facility] [place]   Visit Information   Goals Addressed   None    Patient Care Plan: CCM Pharmacy Care Plan     Problem Identified: Problem: Hypertension, Hyperlipidemia, GERD, Asthma, Chronic Kidney Disease, Anxiety, Allergic Rhinitis, and Pre-diabetes      Long-Range Goal: Patient-Specific Goal   Start Date: 11/30/2020  Expected End Date: 11/30/2021  Recent Progress: On track  Priority: High  Note:   Current Barriers:  Unable to independently monitor therapeutic efficacy Unable to achieve control of blood pressure   Pharmacist Clinical Goal(s):  Patient will achieve adherence to monitoring guidelines and medication adherence to achieve therapeutic efficacy achieve control of blood pressure as evidenced by home readings and office readings  through collaboration with PharmD and provider.   Interventions: 1:1 collaboration with Panosh, Standley Brooking, MD regarding development and update of comprehensive plan of care as evidenced by provider attestation and co-signature Inter-disciplinary care team collaboration (see longitudinal plan of care) Comprehensive medication review performed; medication list updated in electronic medical record  Hypertension (BP goal <140/90) -Uncontrolled -Current treatment: Amlodipine 5 mg 1 tablet daily - in AM - Appropriate, Query effective, Safe, Accessible Doxazosin 2 mg 3 tablets at bedtime - in PM - Appropriate, Query effective, Query Safe, Accessible Carvedilol 6.25 mg daily - in AM - Query Appropriate, Query effective, Query Safe, Accessible Hydralazine 50 mg 1.5 tablets three times daily - Appropriate, Query effective, Safe, Accessible Furosemide 40 mg 2 tablets in  AM and 1 tablet in PM - Appropriate, Query effective, Safe, Accessible -Medications previously tried: unknown  -Current home readings: 180s (in AM and PM) - pain is pretty constant; might need a new cuff, arm cuff -Current dietary habits: puts a little bit of salt in food - used when cooking  -Current exercise habits: limited -Denies hypotensive/hypertensive symptoms -Educated on BP goals and benefits of medications for prevention of heart attack, stroke and kidney damage; Exercise goal of 150 minutes per week; Importance of home blood pressure monitoring; Proper BP monitoring technique; -Counseled to monitor BP at home twice a week, document, and provide log at future appointments -Counseled on diet and exercise extensively Recommended to continue current medication Recommended switching carvedilol to metoprolol due to asthma.  Hyperlipidemia: (LDL goal < 100) -Uncontrolled -Current treatment: Atorvastatin 20 mg 1 tablet daily - Appropriate, Query effective, Safe, Accessible -Medications previously tried: none  -Current dietary patterns: did not discuss -Current exercise habits: limited with SOB -Educated on Cholesterol goals;  -Recommended to continue current medication  Diabetes (A1c goal <6.5%) -Controlled -Current medications: No medications -Medications previously tried: none  -Current home glucose readings fasting glucose: does not need to check post prandial glucose: does not need to check -Denies hypoglycemic/hyperglycemic symptoms -Current meal patterns:  breakfast: did not discuss  lunch: did not discuss   dinner: did not discuss  snacks: did not discuss  drinks: did not discuss  -Current exercise: limited due to SOB -Educated on A1c and blood sugar goals; Carbohydrate counting and/or plate method -Counseled to check feet daily and get yearly eye exams -Counseled on diet and exercise extensively  Asthma (Goal: control symptoms) -Uncontrolled -Current  treatment  Breztri 160-9-4.8 mcg/act 2 puffs twice daily - Appropriate, Effective, Safe,  Accessible Albuterol HFA as needed - Appropriate, Effective, Safe, Accessible Albuterol nebulizer as needed - Appropriate, Effective, Safe, Accessible Montelukast 10 mg 1 tablet daily - Appropriate, Effective, Safe, Accessible -Medications previously tried: n/a  -Pulmonary function testing: FEV1 66% (11/26/17) -Exacerbations requiring treatment in last 6 months: none -Patient reports consistent use of maintenance inhaler -Frequency of rescue inhaler use: more frequently; every morning -Counseled on Proper inhaler technique; Benefits of consistent maintenance inhaler use When to use rescue inhaler -Recommended to continue current medication Assessed patient finances. Applied for [employer] patient assistance and was approved.  Depression/Anxiety (Goal: minimize symptoms) -Not ideally controlled -Current treatment: Citalopram 20 mg 1 tablet daily - Appropriate, Query effective, Safe, Accessible Lorazepam 1 mg 1 tablet every 8 hours as needed - Appropriate, Effective, Query Safe, Accessible -Medications previously tried/failed: none -PHQ9: 1 -GAD7: 3 -Educated on Benefits of medication for symptom control -Recommended to continue current medication Counseled on long term risks of taking benzodiazepines and limiting use. Recommended switching citalopram to alternative SSRI.  Allergic rhinitis (Goal: minimize symptoms) -Controlled -Current treatment  Loratadine 10 mg 1 tablet daily as needed - Appropriate, Effective, Safe, Accessible Fluticasone 50 mcg/act 2 sprays in nostrils daily - Appropriate, Effective, Safe, Accessible -Medications previously tried: Dymista  -Recommended to continue current medication  GERD (Goal: minimize symptoms) -Controlled -Current treatment  Pantoprazole 40 mg 1 tablet twice daily before meals - Appropriate, Effective, Query Safe, Accessible -Medications previously  tried: none  -Recommended to continue current medication  History of DVT (Goal: prevent blood clots) -Controlled -Current treatment  Clopidogrel 75 mg 1 tablet daily - Query Appropriate, Effective, Safe, Accessible -Medications previously tried: none  - Reassess the need at follow up.   Health Maintenance -Vaccine gaps: COVID booster -Current therapy:  Potassium chloride 10 mEq 4 tablets daily  Diclofenac gel 1% as needed Ferrous sulfate 325 mg 1 tablet daily with breakfast Vitamin B12 1000 mcg daily Vitamin D 1000 units daily -Educated on Cost vs benefit of each product must be carefully weighed by individual consumer -Patient is satisfied with current therapy and denies issues -Recommended to continue current medication  Patient Goals/Self-Care Activities Patient will:  - take medications as prescribed check blood pressure twice weekly, document, and provide at future appointments target a minimum of 150 minutes of moderate intensity exercise weekly  Follow Up Plan: Face to Face appointment with care management team member scheduled for:  6 months       Patient verbalizes understanding of instructions and care plan provided today and agrees to view in [place]. Active MyChart status confirmed with patient.   The pharmacy team will reach out to the patient again over the next 30 days.   Viona Gilmore, [facility]

## 2021-07-08 ENCOUNTER — Other Ambulatory Visit: Payer: Self-pay

## 2021-07-08 MED ORDER — BREZTRI AEROSPHERE 160-9-4.8 MCG/ACT IN AERO: INHALATION_SPRAY | RESPIRATORY_TRACT | 11 refills | Status: DC

## 2021-07-10 ENCOUNTER — Other Ambulatory Visit: Payer: Self-pay | Admitting: Internal Medicine

## 2021-07-10 NOTE — Progress Notes (Signed)
Blood sugar  stable good control , kidney function about the same , Thyroid normal range  . Cholesterol  slightly up stom from last year . No other change advised . Forwarding info to  dr Hollie Salk and Dr Acie Fredrickson

## 2021-07-11 ENCOUNTER — Telehealth: Payer: Self-pay | Admitting: Internal Medicine

## 2021-07-11 NOTE — Telephone Encounter (Signed)
Left message for patient to call back and schedule Medicare Annual Wellness Visit (AWV) either virtually or in office. Left  my jabber number [phone] ° ° °AWV-I per [place] 05/15/09 °please schedule at anytime with [facility] Nurse Health Advisor 1 or 2 ° ° °This should be a 45 minute visit.  °

## 2021-07-12 DIAGNOSIS — J45909 Unspecified asthma, uncomplicated: Secondary | ICD-10-CM | POA: Diagnosis not present

## 2021-07-12 DIAGNOSIS — I130 Hypertensive heart and chronic kidney disease with heart failure and stage 1 through stage 4 chronic kidney disease, or unspecified chronic kidney disease: Secondary | ICD-10-CM | POA: Diagnosis not present

## 2021-07-14 ENCOUNTER — Encounter: Payer: Self-pay | Admitting: Internal Medicine

## 2021-07-18 ENCOUNTER — Inpatient Hospital Stay: Payer: Medicare Other | Attending: Hematology

## 2021-07-18 ENCOUNTER — Other Ambulatory Visit: Payer: Self-pay

## 2021-07-18 ENCOUNTER — Inpatient Hospital Stay: Payer: Medicare Other | Admitting: Hematology

## 2021-07-18 ENCOUNTER — Encounter: Payer: Self-pay | Admitting: Hematology

## 2021-07-18 ENCOUNTER — Inpatient Hospital Stay: Payer: Medicare Other

## 2021-07-18 VITALS — BP 150/62

## 2021-07-18 VITALS — BP 160/57 | HR 54 | Temp 98.6°F | Resp 18 | Ht 59.0 in | Wt 149.3 lb

## 2021-07-18 DIAGNOSIS — K449 Diaphragmatic hernia without obstruction or gangrene: Secondary | ICD-10-CM | POA: Insufficient documentation

## 2021-07-18 DIAGNOSIS — I129 Hypertensive chronic kidney disease with stage 1 through stage 4 chronic kidney disease, or unspecified chronic kidney disease: Secondary | ICD-10-CM | POA: Insufficient documentation

## 2021-07-18 DIAGNOSIS — Z86718 Personal history of other venous thrombosis and embolism: Secondary | ICD-10-CM | POA: Insufficient documentation

## 2021-07-18 DIAGNOSIS — Z79631 Long term (current) use of antimetabolite agent: Secondary | ICD-10-CM | POA: Diagnosis not present

## 2021-07-18 DIAGNOSIS — F419 Anxiety disorder, unspecified: Secondary | ICD-10-CM | POA: Diagnosis not present

## 2021-07-18 DIAGNOSIS — D631 Anemia in chronic kidney disease: Secondary | ICD-10-CM | POA: Diagnosis not present

## 2021-07-18 DIAGNOSIS — R109 Unspecified abdominal pain: Secondary | ICD-10-CM | POA: Insufficient documentation

## 2021-07-18 DIAGNOSIS — R634 Abnormal weight loss: Secondary | ICD-10-CM | POA: Insufficient documentation

## 2021-07-18 DIAGNOSIS — J45909 Unspecified asthma, uncomplicated: Secondary | ICD-10-CM | POA: Insufficient documentation

## 2021-07-18 DIAGNOSIS — Z886 Allergy status to analgesic agent status: Secondary | ICD-10-CM | POA: Diagnosis not present

## 2021-07-18 DIAGNOSIS — Z79899 Other long term (current) drug therapy: Secondary | ICD-10-CM | POA: Insufficient documentation

## 2021-07-18 DIAGNOSIS — Z79624 Long term (current) use of inhibitors of nucleotide synthesis: Secondary | ICD-10-CM | POA: Diagnosis not present

## 2021-07-18 DIAGNOSIS — K219 Gastro-esophageal reflux disease without esophagitis: Secondary | ICD-10-CM | POA: Insufficient documentation

## 2021-07-18 DIAGNOSIS — K5730 Diverticulosis of large intestine without perforation or abscess without bleeding: Secondary | ICD-10-CM | POA: Diagnosis not present

## 2021-07-18 DIAGNOSIS — R1310 Dysphagia, unspecified: Secondary | ICD-10-CM | POA: Diagnosis not present

## 2021-07-18 DIAGNOSIS — D638 Anemia in other chronic diseases classified elsewhere: Secondary | ICD-10-CM

## 2021-07-18 DIAGNOSIS — Z880 Allergy status to penicillin: Secondary | ICD-10-CM | POA: Insufficient documentation

## 2021-07-18 DIAGNOSIS — N184 Chronic kidney disease, stage 4 (severe): Secondary | ICD-10-CM | POA: Insufficient documentation

## 2021-07-18 LAB — CBC WITH DIFFERENTIAL (CANCER CENTER ONLY)
Abs Immature Granulocytes: 0.02 10*3/uL (ref 0.00–0.07)
Basophils Absolute: 0 10*3/uL (ref 0.0–0.1)
Basophils Relative: 1 %
Eosinophils Absolute: 0.1 10*3/uL (ref 0.0–0.5)
Eosinophils Relative: 3 %
HCT: 32.2 % — ABNORMAL LOW (ref 36.0–46.0)
Hemoglobin: 10.6 g/dL — ABNORMAL LOW (ref 12.0–15.0)
Immature Granulocytes: 1 %
Lymphocytes Relative: 34 %
Lymphs Abs: 1.3 10*3/uL (ref 0.7–4.0)
MCH: 28.9 pg (ref 26.0–34.0)
MCHC: 32.9 g/dL (ref 30.0–36.0)
MCV: 87.7 fL (ref 80.0–100.0)
Monocytes Absolute: 0.4 10*3/uL (ref 0.1–1.0)
Monocytes Relative: 9 %
Neutro Abs: 2 10*3/uL (ref 1.7–7.7)
Neutrophils Relative %: 52 %
Platelet Count: 165 10*3/uL (ref 150–400)
RBC: 3.67 MIL/uL — ABNORMAL LOW (ref 3.87–5.11)
RDW: 14.8 % (ref 11.5–15.5)
WBC Count: 3.7 10*3/uL — ABNORMAL LOW (ref 4.0–10.5)
nRBC: 0 % (ref 0.0–0.2)

## 2021-07-18 MED ORDER — DARBEPOETIN ALFA 100 MCG/0.5ML IJ SOSY
100.0000 ug | PREFILLED_SYRINGE | Freq: Once | INTRAMUSCULAR | Status: AC
  Administered 2021-07-18: 100 ug via SUBCUTANEOUS
  Filled 2021-07-18: qty 0.5

## 2021-07-18 NOTE — Progress Notes (Signed)
[facility]   [phone] [phone]   Clinic Follow up Note   Patient Care Team: Panosh, Standley Brooking, MD as PCP - General (Internal Medicine) Nahser, Wonda Cheng, MD as PCP - Cardiology (Cardiology) Marlou Sa, Tonna Corner, MD (Orthopedic Surgery) Truitt Merle, MD as Consulting Physician (Hematology) Madelon Lips, MD as Consulting Physician (Nephrology) Monna Fam, MD as Consulting Physician (Ophthalmology) Viona Lopez, [facility] as Pharmacist (Pharmacist)  Date of Service:  07/18/2021  CHIEF COMPLAINT: f/u of anemia of CKD  CURRENT THERAPY:  Aranesp injections, 152mcg q2weeks, starting 03/24/21, will change to q6weeks starting 07/18/21, hold if Hg>=11.0  ASSESSMENT & PLAN:  [name] is a 88 y.o. female with   1. Anemia of chronic disease secondary to CKD.  -I previously saw her in 06/2016, at which time her hgb was up to 10.4 and her ferritin and iron levels were normal. Since that time, her hgb has been trending down, now in the 9 range. Repeated iron studies were all normal. No high suspicion for MDS  -she is taking oral iron and vit B12. -Her anemia is likely secondary to CKD  -We discussed the benefit on the risks of Epo injection, especially the risk of thrombosis, including heart attack and stroke. She had a history of DVT in the past. I started her on Aranesp q2weeks injections on 03/24/21. -labs reviewed, her hgb is up to 10.6 today, will proceed injection today. Given her good response, we will spread out her injections to every 6 weeks.   2. Chronic Renal Disease, stage IV -her creatinine has been in the 2 range lately  -most recent Cr stable at 2.27 on 06/29/21 with GFR 21    3. GERD/dysphagia, Gastric pain, weight loss -Stool OB was negative in June 2022 -CT AP 03/16/21 showed only colonic diverticulosis. -she had EGD on 06/15/21 by Dr. Loletha Carrow, showed a 1 cm hiatal hernia. Pathology from stomach was negative.    4. Hypertension   -Continue low salt diet and anti-hypertensive medications per PCP.      PLAN: -proceed with aranesp today at 177mcg and change to every 6 weeks  -lab and aranesp injection every 6 weeks x4 -f/u in 24 weeks   No problem-specific Assessment & Plan notes found for this encounter.   INTERVAL HISTORY:  [name] is here for a follow up of anemia. She was last seen by me on 04/25/21. She presents to the clinic accompanied by her daughter. She reports doing well overall. She denies any issues from the aranesp injections.   All other systems were reviewed with the patient and are negative.  MEDICAL HISTORY:  Past Medical History:  Diagnosis Date   Acute superficial venous thrombosis of left lower extremity 03/27/2014   concern about hx and potential extensive on exam tenderness calf and low dose lovenox 40 qd 2-4 weekselevetion and close  fu advised .    Allergy    Anemia    Anxiety    Arthritis    "shoulders" (09/09/2012)   Asthma 09/08/2012   Carotid bruit    [place] 2018  low risk 1 - 39%   Cataract    bil cateracts removed   Chronic kidney disease    Clotting disorder (HCC)    blood clots in legs   Diabetes mellitus without complication ([place])    "Borderline" per pt; being monitored.  no meds per pt   Diverticulosis    Dyspnea 04/27/2014   GERD (gastroesophageal reflux disease)  Headache(784.0)    "related to my high blood pressure" (09/09/2012)   Hiatal hernia    History of DVT (deep vein thrombosis)    "RLE" (09/09/2012) coumadine cant take asa so on plavix    HTN (hypertension) 09/08/2012   Hyperlipidemia    Lupus ([place])    "cured years ago" (09/09/2012)   Other dysphagia 02/11/2013   Syncope and collapse 01/21/2018   Varicose veins    Varicose veins of leg with complications 54/00/8676    SURGICAL HISTORY: Past Surgical History:  Procedure Laterality Date   ABDOMINAL HYSTERECTOMY     partial   BREAST EXCISIONAL BIOPSY Right    BREAST SURGERY     CARPAL  TUNNEL RELEASE Right 2000's   CARPAL TUNNEL RELEASE Bilateral 10/21/2013   Procedure: BILATERAL  CARPAL TUNNEL RELEASE;  Surgeon: [name], MD;  Location: [facility];  Service: Orthopedics;  Laterality: Bilateral;   COLONOSCOPY     SHOULDER OPEN ROTATOR CUFF REPAIR Bilateral 2000's   TRIGGER FINGER RELEASE Right 10/21/2013   Procedure: RELEASE TRIGGER FINGER/A-1 PULLEY RIGHT MIDDLE AND RIGHT RING;  Surgeon: [name], MD;  Location: [facility];  Service: Orthopedics;  Laterality: Right;   UPPER GASTROINTESTINAL ENDOSCOPY      I have reviewed the social history and family history with the patient and they are unchanged from previous note.  ALLERGIES:  is allergic to penicillins and aspirin.  MEDICATIONS:  Current Outpatient Medications  Medication Sig Dispense Refill   albuterol (PROVENTIL) (2.5 MG/3ML) 0.083% nebulizer solution USE 1 AMPULE EVERY 6 HOURS AS NEEDED FOR WHEEZE 75 mL 1   albuterol (VENTOLIN HFA) 108 (90 Base) MCG/ACT inhaler USE 2 PUFFS EVERY 6 HOURS AS NEEDED FOR WHEEZING. 8.5 g 3   amLODipine (NORVASC) 5 MG tablet TAKE 1 TABLET BY MOUTH DAILY. 90 tablet 3   atorvastatin (LIPITOR) 20 MG tablet TAKE ONE TABLET BY MOUTH DAILY. 90 tablet 1   azaTHIOprine (IMURAN) 50 MG tablet Take 25 mg by mouth daily.     Azelastine-Fluticasone (DYMISTA) 137-50 MCG/ACT SUSP Place 1 spray into the nose in the morning and at bedtime. 23 g 6   B Complex Vitamins (VITAMIN B COMPLEX) TABS Take 1 tablet by mouth daily.     Budeson-Glycopyrrol-Formoterol (BREZTRI AEROSPHERE) 160-9-4.8 MCG/ACT AERO INHALE 2 PUFFS INTO THE LUNGS TWICE DAILY, IN THE MORNING & AT BEDTIME. 10.7 g 11   carvedilol (COREG) 6.25 MG tablet Take 6.25 mg by mouth daily.     citalopram (CELEXA) 20 MG tablet Take 1 tablet (20 mg total) by mouth daily. 90 tablet 0   clopidogrel (PLAVIX) 75 MG tablet TAKE ONE TABLET BY MOUTH ONCE DAILY. 30 tablet 1   diclofenac Sodium (VOLTAREN) 1 % GEL Apply 4  g topically 4 (four) times daily. 100 g 6   doxazosin (CARDURA) 2 MG tablet TAKE THREE TABLETS BY MOUTH AT BEDTIME 270 tablet 1   ferrous sulfate 325 (65 FE) MG EC tablet Take 1 tablet (325 mg total) by mouth daily with breakfast. 30 tablet 3   fluorometholone (FML) 0.1 % ophthalmic suspension Place 1 drop into both eyes 2 (two) times daily.      fluticasone (FLONASE) 50 MCG/ACT nasal spray Place 2 sprays into both nostrils daily. 16 g 2   furosemide (LASIX) 40 MG tablet Take by mouth. Take 80 mg twice for 2-3 days, then decrease to 80 mg in the am and 40 mg in the pm     hydrALAZINE (  APRESOLINE) 50 MG tablet Take 1.5 tablets (75 mg total) by mouth 3 (three) times daily. 405 tablet 3   LORazepam (ATIVAN) 0.5 MG tablet Take 1 tablet (0.5 mg total) by mouth 2 (two) times daily as needed for anxiety. 30 tablet 0   montelukast (SINGULAIR) 10 MG tablet TAKE 1 TABLET ONCE DAILY. 90 tablet 1   pantoprazole (PROTONIX) 40 MG tablet Take 1 tablet (40 mg total) by mouth 2 (two) times daily. 180 tablet 1   potassium chloride (KLOR-CON) 10 MEQ tablet TAKE FOUR TABLETS BY MOUTH DAILY. 120 tablet 3   prednisoLONE acetate (PRED FORTE) 1 % ophthalmic suspension 1 drop 2 (two) times daily.     Spacer/Aero-Holding Chambers (AEROCHAMBER PLUS) inhaler Use as instructed 1 each 2   vitamin B-12 (CYANOCOBALAMIN) 1000 MCG tablet Take 1,000 mcg by mouth daily.     Vitamin D, Cholecalciferol, 25 MCG (1000 UT) TABS Take 1,000 Units by mouth daily.     No current facility-administered medications for this visit.    PHYSICAL EXAMINATION: ECOG PERFORMANCE STATUS: 1 - Symptomatic but completely ambulatory  Vitals:   07/18/21 1051  BP: (!) 160/57  Pulse: (!) 54  Resp: 18  Temp: 98.6 F (37 C)  SpO2: 100%   Wt Readings from Last 3 Encounters:  07/18/21 149 lb 4.8 oz (67.7 kg)  06/29/21 150 lb 3.2 oz (68.1 kg)  06/15/21 153 lb (69.4 kg)     GENERAL:alert, no distress and comfortable SKIN: skin color normal, no  rashes or significant lesions EYES: normal, Conjunctiva are pink and non-injected, sclera clear  NEURO: alert & oriented x 3 with fluent speech  LABORATORY DATA:  I have reviewed the data as listed CBC Latest Ref Rng & Units 07/18/2021 06/27/2021 06/06/2021  WBC 4.0 - 10.5 K/uL 3.7(L) 3.5(L) 3.3(L)  Hemoglobin 12.0 - 15.0 g/dL 10.6(L) 11.6(L) 10.6(L)  Hematocrit 36.0 - 46.0 % 32.2(L) 34.9(L) 33.7(L)  Platelets 150 - 400 K/uL 165 162 160     CMP Latest Ref Rng & Units 06/29/2021 06/06/2021 05/17/2021  Glucose 70 - 99 mg/dL 120(H) 98 130(H)  BUN 6 - 23 mg/dL 50(H) 38(H) 41(H)  Creatinine 0.40 - 1.20 mg/dL 2.27(H) 2.28(H) 2.30(H)  Sodium 135 - 145 mEq/L 141 143 140  Potassium 3.5 - 5.1 mEq/L 3.6 3.5 3.5  Chloride 96 - 112 mEq/L 106 105 105  CO2 19 - 32 mEq/L 25 31 26   Calcium 8.4 - 10.5 mg/dL 9.4 9.3 9.0  Total Protein 6.5 - 8.1 g/dL - 6.0(L) 6.3(L)  Total Bilirubin 0.3 - 1.2 mg/dL - 0.4 0.5  Alkaline Phos 38 - 126 U/L - 33(L) 38  AST 15 - 41 U/L - 17 18  ALT 0 - 44 U/L - 5 6      RADIOGRAPHIC STUDIES: I have personally reviewed the radiological images as listed and agreed with the findings in the report. No results found.    No orders of the defined types were placed in this encounter.  All questions were answered. The patient knows to call the clinic with any problems, questions or concerns. No barriers to learning was detected. The total time spent in the appointment was 20 minutes.     Truitt Merle, MD 07/18/2021   I, Wilburn Mylar, am acting as scribe for Truitt Merle, MD.   I have reviewed the above documentation for accuracy and completeness, and I agree with the above.

## 2021-07-18 NOTE — Patient Instructions (Signed)
Darbepoetin Alfa injection ?What is this medication? ?DARBEPOETIN ALFA (dar be POE e tin  AL fa) helps your body make more red blood cells. It is used to treat anemia caused by chronic kidney failure and chemotherapy. ?This medicine may be used for other purposes; ask your health care provider or pharmacist if you have questions. ?COMMON BRAND NAME(S): Aranesp ?What should I tell my care team before I take this medication? ?They need to know if you have any of these conditions: ?blood clotting disorders or history of blood clots ?cancer patient not on chemotherapy ?cystic fibrosis ?heart disease, such as angina, heart failure, or a history of a heart attack ?hemoglobin level of 12 g/dL or greater ?high blood pressure ?low levels of folate, iron, or vitamin B12 ?seizures ?an unusual or allergic reaction to darbepoetin, erythropoietin, albumin, hamster proteins, latex, other medicines, foods, dyes, or preservatives ?pregnant or trying to get pregnant ?breast-feeding ?How should I use this medication? ?This medicine is for injection into a vein or under the skin. It is usually given by a health care professional in a hospital or clinic setting. ?If you get this medicine at home, you will be taught how to prepare and give this medicine. Use exactly as directed. Take your medicine at regular intervals. Do not take your medicine more often than directed. ?It is important that you put your used needles and syringes in a special sharps container. Do not put them in a trash can. If you do not have a sharps container, call your pharmacist or healthcare provider to get one. ?A special MedGuide will be given to you by the pharmacist with each prescription and refill. Be sure to read this information carefully each time. ?Talk to your pediatrician regarding the use of this medicine in children. While this medicine may be used in children as young as 1 month of age for selected conditions, precautions do apply. ?Overdosage: If  you think you have taken too much of this medicine contact a poison control center or emergency room at once. ?NOTE: This medicine is only for you. Do not share this medicine with others. ?What if I miss a dose? ?If you miss a dose, take it as soon as you can. If it is almost time for your next dose, take only that dose. Do not take double or extra doses. ?What may interact with this medication? ?Do not take this medicine with any of the following medications: ?epoetin alfa ?This list may not describe all possible interactions. Give your health care provider a list of all the medicines, herbs, non-prescription drugs, or dietary supplements you use. Also tell them if you smoke, drink alcohol, or use illegal drugs. Some items may interact with your medicine. ?What should I watch for while using this medication? ?Your condition will be monitored carefully while you are receiving this medicine. ?You may need blood work done while you are taking this medicine. ?This medicine may cause a decrease in vitamin B6. You should make sure that you get enough vitamin B6 while you are taking this medicine. Discuss the foods you eat and the vitamins you take with your health care professional. ?What side effects may I notice from receiving this medication? ?Side effects that you should report to your doctor or health care professional as soon as possible: ?allergic reactions like skin rash, itching or hives, swelling of the face, lips, or tongue ?breathing problems ?changes in vision ?chest pain ?confusion, trouble speaking or understanding ?feeling faint or lightheaded, falls ?high blood   pressure ?muscle aches or pains ?pain, swelling, warmth in the leg ?rapid weight gain ?severe headaches ?sudden numbness or weakness of the face, arm or leg ?trouble walking, dizziness, loss of balance or coordination ?seizures (convulsions) ?swelling of the ankles, feet, hands ?unusually weak or tired ?Side effects that usually do not require  medical attention (report to your doctor or health care professional if they continue or are bothersome): ?diarrhea ?fever, chills (flu-like symptoms) ?headaches ?nausea, vomiting ?redness, stinging, or swelling at site where injected ?This list may not describe all possible side effects. Call your doctor for medical advice about side effects. You may report side effects to FDA at 1-800-FDA-1088. ?Where should I keep my medication? ?Keep out of the reach of children. ?Store in a refrigerator between 2 and 8 degrees C (36 and 46 degrees F). Do not freeze. Do not shake. Throw away any unused portion if using a single-dose vial. Throw away any unused medicine after the expiration date. ?NOTE: This sheet is a summary. It may not cover all possible information. If you have questions about this medicine, talk to your doctor, pharmacist, or health care provider. ?? 2022 Elsevier/Gold Standard (2017-05-21 00:00:00) ? ?

## 2021-08-02 NOTE — Telephone Encounter (Signed)
Received Mychart notification that pt still has not read message regarding cholesterol labs and Dr Elmarie Shiley recommendations, although pt has since accessed her MyChart acct. Called pt at all numbers on file and got no answer. Left message with granddaughter [name] (on DPR) to please return call to office or send myChart message so that we can update her medication and schedule labs.  ?

## 2021-08-03 ENCOUNTER — Telehealth: Payer: Self-pay | Admitting: Pharmacist

## 2021-08-03 NOTE — Chronic Care Management (AMB) (Signed)
? ? ?Chronic Care Management ?Pharmacy Assistant  ? ?Name: [name]  MRN: [id number] DOB: [birth year] ? ?Reason for Encounter: Disease State / Hypertension Assessment Call ?  ?Conditions to be addressed/monitored: ?HTN ? ?Recent office visits:  ?None ? ?Recent consult visits:  ?07/18/2021 Truitt Merle MD (oncology) - Patient was seen for Anemia of chronic disease. No medication changes. No follow up noted. ? ?Hospital visits:  ?None ? ?Medications: ?Outpatient Encounter Medications as of 08/03/2021  ?Medication Sig  ? albuterol (PROVENTIL) (2.5 MG/3ML) 0.083% nebulizer solution USE 1 AMPULE EVERY 6 HOURS AS NEEDED FOR WHEEZE  ? albuterol (VENTOLIN HFA) 108 (90 Base) MCG/ACT inhaler USE 2 PUFFS EVERY 6 HOURS AS NEEDED FOR WHEEZING.  ? amLODipine (NORVASC) 5 MG tablet TAKE 1 TABLET BY MOUTH DAILY.  ? atorvastatin (LIPITOR) 20 MG tablet TAKE ONE TABLET BY MOUTH DAILY.  ? azaTHIOprine (IMURAN) 50 MG tablet Take 25 mg by mouth daily.  ? Azelastine-Fluticasone (DYMISTA) 137-50 MCG/ACT SUSP Place 1 spray into the nose in the morning and at bedtime.  ? B Complex Vitamins (VITAMIN B COMPLEX) TABS Take 1 tablet by mouth daily.  ? Budeson-Glycopyrrol-Formoterol (BREZTRI AEROSPHERE) 160-9-4.8 MCG/ACT AERO INHALE 2 PUFFS INTO THE LUNGS TWICE DAILY, IN THE MORNING & AT BEDTIME.  ? carvedilol (COREG) 6.25 MG tablet Take 6.25 mg by mouth daily.  ? citalopram (CELEXA) 20 MG tablet Take 1 tablet (20 mg total) by mouth daily.  ? clopidogrel (PLAVIX) 75 MG tablet TAKE ONE TABLET BY MOUTH ONCE DAILY.  ? diclofenac Sodium (VOLTAREN) 1 % GEL Apply 4 g topically 4 (four) times daily.  ? doxazosin (CARDURA) 2 MG tablet TAKE THREE TABLETS BY MOUTH AT BEDTIME  ? ferrous sulfate 325 (65 FE) MG EC tablet Take 1 tablet (325 mg total) by mouth daily with breakfast.  ? fluorometholone (FML) 0.1 % ophthalmic suspension Place 1 drop into both eyes 2 (two) times daily.   ? fluticasone (FLONASE) 50 MCG/ACT nasal spray Place 2 sprays into both  nostrils daily.  ? furosemide (LASIX) 40 MG tablet Take by mouth. Take 80 mg twice for 2-3 days, then decrease to 80 mg in the am and 40 mg in the pm  ? hydrALAZINE (APRESOLINE) 50 MG tablet Take 1.5 tablets (75 mg total) by mouth 3 (three) times daily.  ? LORazepam (ATIVAN) 0.5 MG tablet Take 1 tablet (0.5 mg total) by mouth 2 (two) times daily as needed for anxiety.  ? montelukast (SINGULAIR) 10 MG tablet TAKE 1 TABLET ONCE DAILY.  ? pantoprazole (PROTONIX) 40 MG tablet Take 1 tablet (40 mg total) by mouth 2 (two) times daily.  ? potassium chloride (KLOR-CON) 10 MEQ tablet TAKE FOUR TABLETS BY MOUTH DAILY.  ? prednisoLONE acetate (PRED FORTE) 1 % ophthalmic suspension 1 drop 2 (two) times daily.  ? Spacer/Aero-Holding Chambers (AEROCHAMBER PLUS) inhaler Use as instructed  ? vitamin B-12 (CYANOCOBALAMIN) 1000 MCG tablet Take 1,000 mcg by mouth daily.  ? Vitamin D, Cholecalciferol, 25 MCG (1000 UT) TABS Take 1,000 Units by mouth daily.  ? ?No facility-administered encounter medications on file as of 08/03/2021.  ?Fill History: ?albuterol sulfate 2.5 mg/3 mL (0.083 %) solution for nebulization 05/13/2021 6  ? ?atorvastatin 20 mg tablet 07/07/2021 90  ? ?azelastine-fluticasone 137 mcg-50 mcg/spray nasal spray 05/26/2021 30  ? ?Breztri Aerosphere 160 mcg-84mcg-4.8mcg/actuation HFA aerosol inhaler 06/17/2021 30  ? ?carvedilol 6.25 mg tablet 06/29/2021 90  ? ?citalopram 20 mg tablet 07/11/2021 30  ? ?clopidogrel 75 mg tablet 06/08/2021 30  ? ?  doxazosin 2 mg tablet 06/17/2021 30  ? ?furosemide 40 mg tablet 05/17/2021 90  ? ?lorazepam 0.5 mg tablet 06/29/2021 30  ? ?montelukast 10 mg tablet 05/17/2021 90  ? ?pantoprazole 40 mg tablet,delayed release 05/20/2021 90  ? ?potassium chloride ER 10 mEq tablet,extended release 07/07/2021 30  ? ?amlodipine 5 mg tablet 07/07/2021 90  ? ?azathioprine 50 mg tablet 06/03/2021 60  ? ?hydralazine 50 mg tablet 06/17/2021 30  ? ?Reviewed chart prior to disease state call. Spoke with patient  regarding BP ? ?Recent Office Vitals: ?BP Readings from Last 3 Encounters:  ?07/18/21 (!) 150/62  ?07/18/21 (!) 160/57  ?07/04/21 (!) 160/78  ? ?Pulse Readings from Last 3 Encounters:  ?07/18/21 (!) 54  ?06/29/21 (!) 57  ?06/15/21 (!) 46  ?  ?Wt Readings from Last 3 Encounters:  ?07/18/21 149 lb 4.8 oz (67.7 kg)  ?06/29/21 150 lb 3.2 oz (68.1 kg)  ?06/15/21 153 lb (69.4 kg)  ?  ? ?Kidney Function ?Lab Results  ?Component Value Date/Time  ? CREATININE 2.27 (H) 06/29/2021 02:15 PM  ? CREATININE 2.28 (H) 06/06/2021 02:10 PM  ? CREATININE 2.31 (H) 03/10/2020 11:49 AM  ? CREATININE 1.4 (H) 06/19/2016 01:01 PM  ? CREATININE 1.6 (H) 08/16/2015 11:36 AM  ? GFR 19.22 (L) 06/29/2021 02:15 PM  ? GFRNONAA 21 (L) 06/06/2021 02:10 PM  ? GFRNONAA 19 (L) 03/10/2020 11:49 AM  ? GFRAA 19 11/04/2020 12:00 AM  ? GFRAA 22 (L) 03/10/2020 11:49 AM  ? ? ? ?  Latest Ref Rng & Units 06/29/2021  ?  2:15 PM 06/06/2021  ?  2:10 PM 05/17/2021  ?  1:00 PM  ?BMP  ?Glucose 70 - 99 mg/dL 120   98   130    ?BUN 6 - 23 mg/dL 50   38   41    ?Creatinine 0.40 - 1.20 mg/dL 2.27   2.28   2.30    ?Sodium 135 - 145 mEq/L 141   143   140    ?Potassium 3.5 - 5.1 mEq/L 3.6   3.5   3.5    ?Chloride 96 - 112 mEq/L 106   105   105    ?CO2 19 - 32 mEq/L 25   31   26     ?Calcium 8.4 - 10.5 mg/dL 9.4   9.3   9.0    ? ? ?Current antihypertensive regimen:  ?Amlodipine 5 mg 1 tablet daily  ?Doxazosin 2 mg 3 tablets at bedtime ?Carvedilol 6.25 mg daily ?Hydralazine 50 mg 1.5 tablets three times daily ? ?How often are you checking your Blood Pressure?  ? ?Current home BP readings:  ? ?What recent interventions/DTPs have been made by any provider to improve Blood Pressure control since last CPP Visit: No recent interventions ? ?Any recent hospitalizations or ED visits since last visit with CPP? No recent hospital visits.  ? ?What diet changes have been made to improve Blood Pressure Control?  ?Patient follows ?Breakfast - patient will have ?Lunch - patient will have ?Dinner -  patient will have ? ?What exercise is being done to improve your Blood Pressure Control?  ? ? ?Adherence Review: ?Is the patient currently on ACE/ARB medication? No ?Does the patient have >5 day gap between last estimated fill dates? No ? ?Unable to reach patients granddaughter after multiple attempts.  ? ?Care Gaps: ?AWV - message sent to Ramond Craver ?Last BP - 160/60 on 06/29/2021 ?Last A1C - 5.7 on 06/29/2021 ?  ?Star Rating Drug: ?Atorvastatin 20mg  -  last filled on 07/07/2021 90DS at [facility]  ? ?[name] CMA  ?Clinical Pharmacist Assistant ?[phone] ? ?

## 2021-08-18 ENCOUNTER — Telehealth: Payer: Self-pay

## 2021-08-18 ENCOUNTER — Telehealth: Payer: Self-pay | Admitting: Internal Medicine

## 2021-08-18 NOTE — Telephone Encounter (Signed)
Received Mychart notification of unread message to patient regarding follow-up of lab work and recommendation of Dr Acie Fredrickson to switch from Lipitor to [employer]. Attempted to call patient again. Her primary number is actually her granddaughter's number. Granddaughter sent last reply via Mychart on 3/21 stating that she would check with patient to see what she would like to do, but still not heard anything back and no current appt scheduled. Left message on voicemail to return call to office or reply via MyChart with what she would like to do regarding this issue. ?

## 2021-08-18 NOTE — Telephone Encounter (Signed)
Left message for patient to call back and schedule Medicare Annual Wellness Visit (AWV) either virtually or in office. Left  my jabber number [phone] ° ° °AWV-I per [place] 05/15/09 °please schedule at anytime with [facility] Nurse Health Advisor 1 or 2 ° ° °This should be a 45 minute visit.  °

## 2021-08-19 ENCOUNTER — Other Ambulatory Visit: Payer: Self-pay | Admitting: Internal Medicine

## 2021-08-22 ENCOUNTER — Other Ambulatory Visit: Payer: Self-pay | Admitting: Family Medicine

## 2021-08-22 ENCOUNTER — Other Ambulatory Visit: Payer: Self-pay | Admitting: Internal Medicine

## 2021-08-22 MED ORDER — ROSUVASTATIN CALCIUM 20 MG PO TABS: 20.0000 mg | ORAL_TABLET | Freq: Every day | ORAL | 3 refills | Status: DC

## 2021-08-22 NOTE — Addendum Note (Signed)
Addended by: Ma Hillock on: 08/22/2021 05:29 PM ? ? Modules accepted: Orders ? ?

## 2021-08-23 DIAGNOSIS — H40023 Open angle with borderline findings, high risk, bilateral: Secondary | ICD-10-CM | POA: Diagnosis not present

## 2021-08-23 DIAGNOSIS — H524 Presbyopia: Secondary | ICD-10-CM | POA: Diagnosis not present

## 2021-08-23 DIAGNOSIS — H4710 Unspecified papilledema: Secondary | ICD-10-CM | POA: Diagnosis not present

## 2021-08-23 DIAGNOSIS — H04123 Dry eye syndrome of bilateral lacrimal glands: Secondary | ICD-10-CM | POA: Diagnosis not present

## 2021-08-26 ENCOUNTER — Telehealth: Payer: Self-pay | Admitting: Pharmacist

## 2021-08-26 NOTE — Chronic Care Management (AMB) (Signed)
? ? ?Chronic Care Management ?Pharmacy Assistant  ? ?Name: [name]  MRN: [id number] DOB: 1933/09/09 ? ?Reason for Encounter: Disease State / Hypertension Assessment Call ?  ?Conditions to be addressed/monitored: ?HTN ? ?Recent office visits:  ?None ? ?Recent consult visits:  ?None ? ?Hospital visits:  ?None ? ?Medications: ?Outpatient Encounter Medications as of 08/26/2021  ?Medication Sig  ? albuterol (PROVENTIL) (2.5 MG/3ML) 0.083% nebulizer solution USE 1 AMPULE EVERY 6 HOURS AS NEEDED FOR WHEEZE  ? albuterol (VENTOLIN HFA) 108 (90 Base) MCG/ACT inhaler USE 2 PUFFS EVERY 6 HOURS AS NEEDED FOR WHEEZING.  ? amLODipine (NORVASC) 5 MG tablet TAKE 1 TABLET BY MOUTH DAILY.  ? azaTHIOprine (IMURAN) 50 MG tablet Take 25 mg by mouth daily.  ? Azelastine-Fluticasone (DYMISTA) 137-50 MCG/ACT SUSP Place 1 spray into the nose in the morning and at bedtime.  ? B Complex Vitamins (VITAMIN B COMPLEX) TABS Take 1 tablet by mouth daily.  ? Budeson-Glycopyrrol-Formoterol (BREZTRI AEROSPHERE) 160-9-4.8 MCG/ACT AERO INHALE 2 PUFFS INTO THE LUNGS TWICE DAILY, IN THE MORNING & AT BEDTIME.  ? carvedilol (COREG) 6.25 MG tablet Take 6.25 mg by mouth daily.  ? citalopram (CELEXA) 20 MG tablet Take 1 tablet (20 mg total) by mouth daily.  ? clopidogrel (PLAVIX) 75 MG tablet TAKE ONE TABLET BY MOUTH ONCE DAILY.  ? diclofenac Sodium (VOLTAREN) 1 % GEL Apply 4 g topically 4 (four) times daily.  ? doxazosin (CARDURA) 2 MG tablet Take 3 tablets (6 mg total) by mouth at bedtime.  ? ferrous sulfate 325 (65 FE) MG EC tablet Take 1 tablet (325 mg total) by mouth daily with breakfast.  ? fluorometholone (FML) 0.1 % ophthalmic suspension Place 1 drop into both eyes 2 (two) times daily.   ? fluticasone (FLONASE) 50 MCG/ACT nasal spray Place 2 sprays into both nostrils daily.  ? furosemide (LASIX) 40 MG tablet Take by mouth. Take 80 mg twice for 2-3 days, then decrease to 80 mg in the am and 40 mg in the pm  ? hydrALAZINE (APRESOLINE) 50 MG  tablet Take 1.5 tablets (75 mg total) by mouth 3 (three) times daily.  ? LORazepam (ATIVAN) 0.5 MG tablet Take 1 tablet (0.5 mg total) by mouth 2 (two) times daily as needed for anxiety.  ? montelukast (SINGULAIR) 10 MG tablet TAKE ONE TABLET BY MOUTH ONCE DAILY  ? pantoprazole (PROTONIX) 40 MG tablet Take 1 tablet (40 mg total) by mouth 2 (two) times daily.  ? potassium chloride (KLOR-CON) 10 MEQ tablet TAKE FOUR TABLETS BY MOUTH DAILY.  ? prednisoLONE acetate (PRED FORTE) 1 % ophthalmic suspension 1 drop 2 (two) times daily.  ? rosuvastatin (CRESTOR) 20 MG tablet Take 1 tablet (20 mg total) by mouth daily.  ? Spacer/Aero-Holding Chambers (AEROCHAMBER PLUS) inhaler Use as instructed  ? vitamin B-12 (CYANOCOBALAMIN) 1000 MCG tablet Take 1,000 mcg by mouth daily.  ? Vitamin D, Cholecalciferol, 25 MCG (1000 UT) TABS Take 1,000 Units by mouth daily.  ? ?No facility-administered encounter medications on file as of 08/26/2021.  ?Fill History: ?albuterol sulfate HFA 90 mcg/actuation aerosol inhaler 07/20/2021 25  ? ?atorvastatin 20 mg tablet 07/07/2021 90  ? ?azelastine-fluticasone 137 mcg-50 mcg/spray nasal spray 05/26/2021 30  ? ?Breztri Aerosphere 160 mcg-8mcg-4.8mcg/actuation HFA aerosol inhaler 06/17/2021 30  ? ?carvedilol 6.25 mg tablet 06/29/2021 90  ? ?citalopram 20 mg tablet 07/11/2021 30  ? ?clopidogrel 75 mg tablet 07/20/2021 30  ? ?doxazosin 2 mg tablet 07/20/2021 30  ? ?furosemide 40 mg tablet 05/17/2021 90  ? ?  lorazepam 0.5 mg tablet 06/29/2021 30  ? ?montelukast 10 mg tablet 05/17/2021 90  ? ?pantoprazole 40 mg tablet,delayed release 05/20/2021 90  ? ?potassium chloride ER 10 mEq tablet,extended release 07/07/2021 30  ? ?amlodipine 5 mg tablet 07/07/2021 90  ? ?azathioprine 50 mg tablet 08/02/2021 60  ? ?hydralazine 50 mg tablet 08/22/2021 30  ? ?Reviewed chart prior to disease state call. Spoke with patient regarding BP ? ?Recent Office Vitals: ?BP Readings from Last 3 Encounters:  ?07/18/21 (!) 150/62   ?07/18/21 (!) 160/57  ?07/04/21 (!) 160/78  ? ?Pulse Readings from Last 3 Encounters:  ?07/18/21 (!) 54  ?06/29/21 (!) 57  ?06/15/21 (!) 46  ?  ?Wt Readings from Last 3 Encounters:  ?07/18/21 149 lb 4.8 oz (67.7 kg)  ?06/29/21 150 lb 3.2 oz (68.1 kg)  ?06/15/21 153 lb (69.4 kg)  ?  ? ?Kidney Function ?Lab Results  ?Component Value Date/Time  ? CREATININE 2.27 (H) 06/29/2021 02:15 PM  ? CREATININE 2.28 (H) 06/06/2021 02:10 PM  ? CREATININE 2.31 (H) 03/10/2020 11:49 AM  ? CREATININE 1.4 (H) 06/19/2016 01:01 PM  ? CREATININE 1.6 (H) 08/16/2015 11:36 AM  ? GFR 19.22 (L) 06/29/2021 02:15 PM  ? GFRNONAA 21 (L) 06/06/2021 02:10 PM  ? GFRNONAA 19 (L) 03/10/2020 11:49 AM  ? GFRAA 19 11/04/2020 12:00 AM  ? GFRAA 22 (L) 03/10/2020 11:49 AM  ? ? ? ?  Latest Ref Rng & Units 06/29/2021  ?  2:15 PM 06/06/2021  ?  2:10 PM 05/17/2021  ?  1:00 PM  ?BMP  ?Glucose 70 - 99 mg/dL 120   98   130    ?BUN 6 - 23 mg/dL 50   38   41    ?Creatinine 0.40 - 1.20 mg/dL 2.27   2.28   2.30    ?Sodium 135 - 145 mEq/L 141   143   140    ?Potassium 3.5 - 5.1 mEq/L 3.6   3.5   3.5    ?Chloride 96 - 112 mEq/L 106   105   105    ?CO2 19 - 32 mEq/L 25   31   26     ?Calcium 8.4 - 10.5 mg/dL 9.4   9.3   9.0    ? ?Current antihypertensive regimen:  ?Amlodipine 5 mg 1 tablet daily  ?Doxazosin 2 mg 3 tablets at bedtime ?Carvedilol 6.25 mg daily ?Hydralazine 50 mg 1.5 tablets three times daily ? ?How often are you checking your Blood Pressure?  ? ?Current home BP readings:  ? ?What recent interventions/DTPs have been made by any provider to improve Blood Pressure control since last CPP Visit: No recent interventions noted. ? ?Any recent hospitalizations or ED visits since last visit with CPP? No recent hospital visits.  ? ?What diet changes have been made to improve Blood Pressure Control?  ?Patient follows ?Breakfast - patient will have ?Lunch - patient will have ?Dinner - patient will have ? ?What exercise is being done to improve your Blood Pressure Control?   ? ? ?Adherence Review: ?Is the patient currently on ACE/ARB medication? No ?Does the patient have >5 day gap between last estimated fill dates? No ? ?Unable to reach patients granddaughter [name] after several attempts.  ?  ?Care Gaps: ?AWV - message sent to [name] ?Last BP - 160/60 on 06/29/2021 ?Last A1C - 5.7 on 06/29/2021 ?  ?Star Rating Drug: ?Atorvastatin 20mg  - last filled on 07/07/2021 90DS at [facility]  ?  ?[name]  CMA  ?Clinical Pharmacist Assistant ?[phone] ? ? ?

## 2021-08-29 ENCOUNTER — Inpatient Hospital Stay: Payer: Medicare Other | Attending: Hematology

## 2021-08-29 ENCOUNTER — Other Ambulatory Visit: Payer: Self-pay

## 2021-08-29 ENCOUNTER — Inpatient Hospital Stay: Payer: Medicare Other

## 2021-08-29 VITALS — BP 172/77 | HR 56 | Temp 98.3°F | Resp 17

## 2021-08-29 DIAGNOSIS — Z79899 Other long term (current) drug therapy: Secondary | ICD-10-CM | POA: Insufficient documentation

## 2021-08-29 DIAGNOSIS — R109 Unspecified abdominal pain: Secondary | ICD-10-CM | POA: Insufficient documentation

## 2021-08-29 DIAGNOSIS — N184 Chronic kidney disease, stage 4 (severe): Secondary | ICD-10-CM | POA: Insufficient documentation

## 2021-08-29 DIAGNOSIS — D631 Anemia in chronic kidney disease: Secondary | ICD-10-CM | POA: Diagnosis not present

## 2021-08-29 DIAGNOSIS — R634 Abnormal weight loss: Secondary | ICD-10-CM | POA: Diagnosis not present

## 2021-08-29 DIAGNOSIS — I129 Hypertensive chronic kidney disease with stage 1 through stage 4 chronic kidney disease, or unspecified chronic kidney disease: Secondary | ICD-10-CM | POA: Diagnosis not present

## 2021-08-29 DIAGNOSIS — K219 Gastro-esophageal reflux disease without esophagitis: Secondary | ICD-10-CM | POA: Diagnosis not present

## 2021-08-29 DIAGNOSIS — D638 Anemia in other chronic diseases classified elsewhere: Secondary | ICD-10-CM

## 2021-08-29 LAB — CBC WITH DIFFERENTIAL (CANCER CENTER ONLY)
Abs Immature Granulocytes: 0.02 10*3/uL (ref 0.00–0.07)
Basophils Absolute: 0 10*3/uL (ref 0.0–0.1)
Basophils Relative: 1 %
Eosinophils Absolute: 0.2 10*3/uL (ref 0.0–0.5)
Eosinophils Relative: 4 %
HCT: 30.9 % — ABNORMAL LOW (ref 36.0–46.0)
Hemoglobin: 9.8 g/dL — ABNORMAL LOW (ref 12.0–15.0)
Immature Granulocytes: 1 %
Lymphocytes Relative: 36 %
Lymphs Abs: 1.6 10*3/uL (ref 0.7–4.0)
MCH: 28.8 pg (ref 26.0–34.0)
MCHC: 31.7 g/dL (ref 30.0–36.0)
MCV: 90.9 fL (ref 80.0–100.0)
Monocytes Absolute: 0.4 10*3/uL (ref 0.1–1.0)
Monocytes Relative: 9 %
Neutro Abs: 2.1 10*3/uL (ref 1.7–7.7)
Neutrophils Relative %: 49 %
Platelet Count: 165 10*3/uL (ref 150–400)
RBC: 3.4 MIL/uL — ABNORMAL LOW (ref 3.87–5.11)
RDW: 14.5 % (ref 11.5–15.5)
WBC Count: 4.3 10*3/uL (ref 4.0–10.5)
nRBC: 0 % (ref 0.0–0.2)

## 2021-08-29 MED ORDER — DARBEPOETIN ALFA 100 MCG/0.5ML IJ SOSY
100.0000 ug | PREFILLED_SYRINGE | Freq: Once | INTRAMUSCULAR | Status: AC
  Administered 2021-08-29: 100 ug via SUBCUTANEOUS
  Filled 2021-08-29: qty 0.5

## 2021-08-29 NOTE — Patient Instructions (Signed)
Darbepoetin Alfa injection ?What is this medication? ?DARBEPOETIN ALFA (dar be POE e tin  AL fa) helps your body make more red blood cells. It is used to treat anemia caused by chronic kidney failure and chemotherapy. ?This medicine may be used for other purposes; ask your health care provider or pharmacist if you have questions. ?COMMON BRAND NAME(S): Aranesp ?What should I tell my care team before I take this medication? ?They need to know if you have any of these conditions: ?blood clotting disorders or history of blood clots ?cancer patient not on chemotherapy ?cystic fibrosis ?heart disease, such as angina, heart failure, or a history of a heart attack ?hemoglobin level of 12 g/dL or greater ?high blood pressure ?low levels of folate, iron, or vitamin B12 ?seizures ?an unusual or allergic reaction to darbepoetin, erythropoietin, albumin, hamster proteins, latex, other medicines, foods, dyes, or preservatives ?pregnant or trying to get pregnant ?breast-feeding ?How should I use this medication? ?This medicine is for injection into a vein or under the skin. It is usually given by a health care professional in a hospital or clinic setting. ?If you get this medicine at home, you will be taught how to prepare and give this medicine. Use exactly as directed. Take your medicine at regular intervals. Do not take your medicine more often than directed. ?It is important that you put your used needles and syringes in a special sharps container. Do not put them in a trash can. If you do not have a sharps container, call your pharmacist or healthcare provider to get one. ?A special MedGuide will be given to you by the pharmacist with each prescription and refill. Be sure to read this information carefully each time. ?Talk to your pediatrician regarding the use of this medicine in children. While this medicine may be used in children as young as 1 month of age for selected conditions, precautions do apply. ?Overdosage: If  you think you have taken too much of this medicine contact a poison control center or emergency room at once. ?NOTE: This medicine is only for you. Do not share this medicine with others. ?What if I miss a dose? ?If you miss a dose, take it as soon as you can. If it is almost time for your next dose, take only that dose. Do not take double or extra doses. ?What may interact with this medication? ?Do not take this medicine with any of the following medications: ?epoetin alfa ?This list may not describe all possible interactions. Give your health care provider a list of all the medicines, herbs, non-prescription drugs, or dietary supplements you use. Also tell them if you smoke, drink alcohol, or use illegal drugs. Some items may interact with your medicine. ?What should I watch for while using this medication? ?Your condition will be monitored carefully while you are receiving this medicine. ?You may need blood work done while you are taking this medicine. ?This medicine may cause a decrease in vitamin B6. You should make sure that you get enough vitamin B6 while you are taking this medicine. Discuss the foods you eat and the vitamins you take with your health care professional. ?What side effects may I notice from receiving this medication? ?Side effects that you should report to your doctor or health care professional as soon as possible: ?allergic reactions like skin rash, itching or hives, swelling of the face, lips, or tongue ?breathing problems ?changes in vision ?chest pain ?confusion, trouble speaking or understanding ?feeling faint or lightheaded, falls ?high blood   pressure ?muscle aches or pains ?pain, swelling, warmth in the leg ?rapid weight gain ?severe headaches ?sudden numbness or weakness of the face, arm or leg ?trouble walking, dizziness, loss of balance or coordination ?seizures (convulsions) ?swelling of the ankles, feet, hands ?unusually weak or tired ?Side effects that usually do not require  medical attention (report to your doctor or health care professional if they continue or are bothersome): ?diarrhea ?fever, chills (flu-like symptoms) ?headaches ?nausea, vomiting ?redness, stinging, or swelling at site where injected ?This list may not describe all possible side effects. Call your doctor for medical advice about side effects. You may report side effects to FDA at 1-800-FDA-1088. ?Where should I keep my medication? ?Keep out of the reach of children and pets. ?Store in a refrigerator. Do not freeze. Do not shake. Throw away any unused portion if using a single-dose vial. Multi-dose vials can be kept in the refrigerator for up to 21 days after the initial dose. Throw away unused medicine. ?To get rid of medications that are no longer needed or have expired: ?Take the medication to a medication take-back program. Check with your pharmacy or law enforcement to find a location. ?If you cannot return the medication, ask your pharmacist or care team how to get rid of the medication safely. ?NOTE: This sheet is a summary. It may not cover all possible information. If you have questions about this medicine, talk to your doctor, pharmacist, or health care provider. ?? 2023 Elsevier/Gold Standard (2021-05-02 00:00:00) ? ?

## 2021-08-29 NOTE — Progress Notes (Signed)
Patients vital signs  for B/p were 177/77 ?

## 2021-09-09 ENCOUNTER — Emergency Department (HOSPITAL_COMMUNITY): Payer: Medicare Other

## 2021-09-09 ENCOUNTER — Ambulatory Visit (HOSPITAL_COMMUNITY)
Admission: EM | Admit: 2021-09-09 | Discharge: 2021-09-09 | Disposition: A | Discharge status: Home / Self Care | Payer: Medicare Other | Attending: Internal Medicine | Admitting: Internal Medicine

## 2021-09-09 ENCOUNTER — Other Ambulatory Visit: Payer: Self-pay

## 2021-09-09 ENCOUNTER — Emergency Department (HOSPITAL_COMMUNITY)
Admission: EM | Admit: 2021-09-09 | Discharge: 2021-09-10 | Disposition: A | Discharge status: Home / Self Care | Payer: Medicare Other | Attending: Emergency Medicine | Admitting: Emergency Medicine

## 2021-09-09 ENCOUNTER — Encounter (HOSPITAL_COMMUNITY): Payer: Self-pay | Admitting: *Deleted

## 2021-09-09 DIAGNOSIS — R7989 Other specified abnormal findings of blood chemistry: Secondary | ICD-10-CM | POA: Diagnosis not present

## 2021-09-09 DIAGNOSIS — I129 Hypertensive chronic kidney disease with stage 1 through stage 4 chronic kidney disease, or unspecified chronic kidney disease: Secondary | ICD-10-CM | POA: Diagnosis not present

## 2021-09-09 DIAGNOSIS — R634 Abnormal weight loss: Secondary | ICD-10-CM | POA: Diagnosis not present

## 2021-09-09 DIAGNOSIS — D631 Anemia in chronic kidney disease: Secondary | ICD-10-CM | POA: Diagnosis not present

## 2021-09-09 DIAGNOSIS — K219 Gastro-esophageal reflux disease without esophagitis: Secondary | ICD-10-CM | POA: Diagnosis not present

## 2021-09-09 DIAGNOSIS — R519 Headache, unspecified: Secondary | ICD-10-CM | POA: Diagnosis not present

## 2021-09-09 DIAGNOSIS — N184 Chronic kidney disease, stage 4 (severe): Secondary | ICD-10-CM | POA: Diagnosis not present

## 2021-09-09 DIAGNOSIS — I160 Hypertensive urgency: Secondary | ICD-10-CM

## 2021-09-09 DIAGNOSIS — R42 Dizziness and giddiness: Secondary | ICD-10-CM | POA: Diagnosis not present

## 2021-09-09 DIAGNOSIS — Z79899 Other long term (current) drug therapy: Secondary | ICD-10-CM | POA: Insufficient documentation

## 2021-09-09 DIAGNOSIS — I10 Essential (primary) hypertension: Secondary | ICD-10-CM | POA: Diagnosis not present

## 2021-09-09 DIAGNOSIS — R531 Weakness: Secondary | ICD-10-CM | POA: Diagnosis not present

## 2021-09-09 DIAGNOSIS — Z7902 Long term (current) use of antithrombotics/antiplatelets: Secondary | ICD-10-CM | POA: Insufficient documentation

## 2021-09-09 DIAGNOSIS — R109 Unspecified abdominal pain: Secondary | ICD-10-CM | POA: Diagnosis not present

## 2021-09-09 LAB — BASIC METABOLIC PANEL
Anion gap: 5 (ref 5–15)
BUN: 32 mg/dL — ABNORMAL HIGH (ref 8–23)
CO2: 25 mmol/L (ref 22–32)
Calcium: 8.8 mg/dL — ABNORMAL LOW (ref 8.9–10.3)
Chloride: 108 mmol/L (ref 98–111)
Creatinine, Ser: 2.38 mg/dL — ABNORMAL HIGH (ref 0.44–1.00)
GFR, Estimated: 20 mL/min — ABNORMAL LOW (ref >60)
Glucose, Bld: 113 mg/dL — ABNORMAL HIGH (ref 70–99)
Potassium: 3.6 mmol/L (ref 3.5–5.1)
Sodium: 138 mmol/L (ref 135–145)

## 2021-09-09 LAB — CBC WITH DIFFERENTIAL/PLATELET
Abs Immature Granulocytes: 0.03 10*3/uL (ref 0.00–0.07)
Basophils Absolute: 0 10*3/uL (ref 0.0–0.1)
Basophils Relative: 1 %
Eosinophils Absolute: 0.1 10*3/uL (ref 0.0–0.5)
Eosinophils Relative: 1 %
HCT: 34.3 % — ABNORMAL LOW (ref 36.0–46.0)
Hemoglobin: 10.5 g/dL — ABNORMAL LOW (ref 12.0–15.0)
Immature Granulocytes: 1 %
Lymphocytes Relative: 32 %
Lymphs Abs: 1.2 10*3/uL (ref 0.7–4.0)
MCH: 29.2 pg (ref 26.0–34.0)
MCHC: 30.6 g/dL (ref 30.0–36.0)
MCV: 95.3 fL (ref 80.0–100.0)
Monocytes Absolute: 0.4 10*3/uL (ref 0.1–1.0)
Monocytes Relative: 10 %
Neutro Abs: 2 10*3/uL (ref 1.7–7.7)
Neutrophils Relative %: 55 %
Platelets: 167 10*3/uL (ref 150–400)
RBC: 3.6 MIL/uL — ABNORMAL LOW (ref 3.87–5.11)
RDW: 15.9 % — ABNORMAL HIGH (ref 11.5–15.5)
WBC: 3.7 10*3/uL — ABNORMAL LOW (ref 4.0–10.5)
nRBC: 0 % (ref 0.0–0.2)

## 2021-09-09 LAB — TROPONIN I (HIGH SENSITIVITY): Troponin I (High Sensitivity): 98 ng/L — ABNORMAL HIGH (ref <18)

## 2021-09-09 NOTE — ED Triage Notes (Signed)
The pt is c/o a headache and dizziness since 1200n today  she has not taken anyhting for the headache  hx of dizziness ?

## 2021-09-09 NOTE — ED Provider Triage Note (Signed)
Emergency Medicine Provider Triage Evaluation Note ? ?[name] , a 88 y.o. female  was evaluated in triage.  Pt complains of headache.  Patient states headache started around noon today.  It was a gradual onset and is gradually worsened.  Headache is in bilateral temporal regions.  She has associated dizziness that it started right after she noticed a headache.  She does not remember having a fall or hitting her head recently.  She is on Plavix and no other blood thinners.  She was seen by urgent care today who sent her to the emergency department because she was found to be very hypertensive.  Patient denies any chest pain or shortness of breath.  She has no nausea or vomiting.  No visual disturbance, speech changes, numbness, or weakness.  Denies any gait abnormalities or balance issues. ? ?Review of Systems  ?Positive:  ?Negative:  ? ?Physical Exam  ?BP (!) 217/63 (BP Location: Left Arm)   Pulse (!) 55   Temp 98.6 ?F (37 ?C) (Oral)   Resp 15   Ht 4\' 11"  (1.499 m)   Wt 67.7 kg   SpO2 98%   BMI 30.15 kg/m?  ?Gen:   Awake, no distress   ?Resp:  Normal effort  ?MSK:   Moves extremities without difficulty  ?Other:  Alert and oriented x3.  Pupils are equal round and reactive.  EOMs are intact.  No facial droop noted on exam.  She is moving all extremities without difficulty.  Strength seems 5 out of 5 in all extremities. ?Jaw claudication or temporal artery tenderness to palpation. ? ?Medical Decision Making  ?Medically screening exam initiated at 6:34 PM.  Appropriate orders placed.  [name] was informed that the remainder of the evaluation will be completed by another provider, this initial triage assessment does not replace that evaluation, and the importance of remaining in the ED until their evaluation is complete. ? ? ?  ?Angela Birchwood, PA-C ?09/09/21 1835 ? ?

## 2021-09-09 NOTE — Discharge Instructions (Signed)
Patient is being discharged from the Urgent Care and sent to the Emergency Department via car . Per Dr.Banister, patient is in need of higher level of care due to hypertensive concerns. Patient is aware and verbalizes understanding of plan of care.  ?Vitals:  ? 09/09/21 1750  ?BP: (!) 199/62  ?Pulse: (!) 59  ?Resp: 18  ?Temp: 98.1 ?F (36.7 ?C)  ?SpO2: 96%  ?  ?

## 2021-09-09 NOTE — ED Provider Notes (Signed)
Pt seen in triage--dizziness, swelling, h/a. BP elevated. We asked her to proceed to the ER for urgent evaluation ?  ?Barrett Henle, MD ?09/09/21 1754 ? ?

## 2021-09-10 LAB — TROPONIN I (HIGH SENSITIVITY): Troponin I (High Sensitivity): 120 ng/L (ref <18)

## 2021-09-10 MED ORDER — CLONIDINE HCL 0.2 MG PO TABS
0.2000 mg | ORAL_TABLET | Freq: Once | ORAL | Status: DC
Start: 2021-09-10 — End: 2021-09-10

## 2021-09-10 MED ORDER — CLONIDINE HCL 0.2 MG PO TABS
0.2000 mg | ORAL_TABLET | Freq: Once | ORAL | Status: AC
  Administered 2021-09-10: 0.2 mg via ORAL
  Filled 2021-09-10: qty 1

## 2021-09-10 MED ORDER — CLONIDINE HCL 0.1 MG PO TABS: ORAL_TABLET | ORAL | 0 refills | Status: DC

## 2021-09-10 NOTE — Discharge Instructions (Addendum)
Begin taking clonidine as prescribed as needed for blood pressure is elevated over 979 systolic. ? ?Keep a record of your blood pressures at home and if you need to take the above medication and take this with you to your next doctor's appointment.  Ideally this should be within the next 1 to 2 weeks. ? ?Return to the emergency department if symptoms significantly worsen or change. ?

## 2021-09-10 NOTE — ED Notes (Signed)
ED provider informed of pt's troponin 120 ?

## 2021-09-10 NOTE — ED Provider Notes (Signed)
?[place] ?Provider Note ? ? ?CSN: [id number] ?Arrival date & time: 09/09/21  1759 ? ?  ? ?History ? ?Chief Complaint  ?Patient presents with  ? Headache  ? Dizziness  ? ? ?[name] is a 88 y.o. female. ? ?Patient is an 88 year old female with past medical history of hypertension, hyperlipidemia, chronic renal insufficiency.  Patient presenting today with complaints of headache, weakness, and elevated blood pressure.  She denies any injury or trauma.  She denies any visual disturbances.  Patient was seen in urgent care, then sent here because her blood pressure was significantly elevated.  She denies any extremity weakness or numbness. ? ?The history is provided by the patient.  ? ?  ? ?Home Medications ?Prior to Admission medications   ?Medication Sig Start Date End Date Taking? Authorizing Provider  ?acetaminophen (TYLENOL) 500 MG tablet Take 500 mg by mouth in the morning and at bedtime.   Yes [provider]  ?albuterol (PROVENTIL) (2.5 MG/3ML) 0.083% nebulizer solution USE 1 AMPULE EVERY 6 HOURS AS NEEDED FOR WHEEZE ?Patient taking differently: Take 2.5 mg by nebulization every 6 (six) hours as needed for shortness of breath or wheezing. 12/20/20  Yes Collene Gobble, MD  ?albuterol (VENTOLIN HFA) 108 (90 Base) MCG/ACT inhaler USE 2 PUFFS EVERY 6 HOURS AS NEEDED FOR WHEEZING. ?Patient taking differently: Inhale 2 puffs into the lungs every 6 (six) hours as needed for shortness of breath or wheezing. 09/26/20  Yes Collene Gobble, MD  ?amLODipine (NORVASC) 5 MG tablet TAKE 1 TABLET BY MOUTH DAILY. ?Patient taking differently: Take 5 mg by mouth every evening. 10/01/20  Yes Nahser, Wonda Cheng, MD  ?azaTHIOprine (IMURAN) 50 MG tablet Take 25 mg by mouth at bedtime. 04/09/21  Yes [provider]  ?B Complex Vitamins (VITAMIN B COMPLEX) TABS Take 1 tablet by mouth daily.   Yes [provider]  ?Budeson-Glycopyrrol-Formoterol (BREZTRI AEROSPHERE)  160-9-4.8 MCG/ACT AERO INHALE 2 PUFFS INTO THE LUNGS TWICE DAILY, IN THE MORNING & AT BEDTIME. ?Patient taking differently: Inhale 2 puffs into the lungs in the morning and at bedtime. 07/08/21  Yes Collene Gobble, MD  ?carvedilol (COREG) 6.25 MG tablet Take 6.25 mg by mouth daily.   Yes [provider]  ?cetirizine (ZYRTEC) 10 MG tablet Take 10 mg by mouth daily.   Yes [provider]  ?citalopram (CELEXA) 20 MG tablet Take 1 tablet (20 mg total) by mouth daily. ?Patient taking differently: Take 20 mg by mouth every evening. 07/11/21  Yes Panosh, Standley Brooking, MD  ?diclofenac Sodium (VOLTAREN) 1 % GEL Apply 4 g topically 4 (four) times daily. ?Patient taking differently: Apply 4 g topically in the morning and at bedtime. 01/27/21  Yes Panosh, Standley Brooking, MD  ?doxazosin (CARDURA) 2 MG tablet Take 3 tablets (6 mg total) by mouth at bedtime. 08/22/21  Yes Panosh, Standley Brooking, MD  ?ferrous sulfate 325 (65 FE) MG EC tablet Take 1 tablet (325 mg total) by mouth daily with breakfast. 12/15/14  Yes Panosh, Standley Brooking, MD  ?fluticasone (FLONASE) 50 MCG/ACT nasal spray Place 2 sprays into both nostrils daily. 10/20/19  Yes Collene Gobble, MD  ?furosemide (LASIX) 40 MG tablet Take 40-80 mg by mouth See admin instructions. 80 mg in the am and 40 mg in the pm   Yes [provider]  ?hydrALAZINE (APRESOLINE) 50 MG tablet Take 1.5 tablets (75 mg total) by mouth 3 (three) times daily. 10/04/20  Yes Nahser, Arnette Norris  J, MD  ?LORazepam (ATIVAN) 0.5 MG tablet Take 1 tablet (0.5 mg total) by mouth 2 (two) times daily as needed for anxiety. 06/29/21  Yes Panosh, Standley Brooking, MD  ?montelukast (SINGULAIR) 10 MG tablet TAKE ONE TABLET BY MOUTH ONCE DAILY ?Patient taking differently: Take 10 mg by mouth at bedtime. 08/23/21  Yes Panosh, Standley Brooking, MD  ?pantoprazole (PROTONIX) 40 MG tablet Take 1 tablet (40 mg total) by mouth 2 (two) times daily. 08/23/21  Yes Panosh, Standley Brooking, MD  ?potassium chloride (KLOR-CON) 10 MEQ tablet TAKE FOUR TABLETS BY  MOUTH DAILY. ?Patient taking differently: Take 20 mEq by mouth 2 (two) times daily. 05/19/21  Yes Panosh, Standley Brooking, MD  ?rosuvastatin (CRESTOR) 20 MG tablet Take 1 tablet (20 mg total) by mouth daily. 08/22/21  Yes Nahser, Wonda Cheng, MD  ?vitamin B-12 (CYANOCOBALAMIN) 1000 MCG tablet Take 1,000 mcg by mouth daily.   Yes [provider]  ?Vitamin D, Cholecalciferol, 25 MCG (1000 UT) TABS Take 1,000 Units by mouth daily.   Yes [provider]  ?Azelastine-Fluticasone (DYMISTA) 137-50 MCG/ACT SUSP Place 1 spray into the nose in the morning and at bedtime. ?Patient not taking: Reported on 09/10/2021 01/12/21   Collene Gobble, MD  ?clopidogrel (PLAVIX) 75 MG tablet TAKE ONE TABLET BY MOUTH ONCE DAILY. ?Patient taking differently: Take 75 mg by mouth every evening. 08/23/21   Panosh, Standley Brooking, MD  ?Spacer/Aero-Holding Chambers (AEROCHAMBER PLUS) inhaler Use as instructed 05/26/16   Melynda Ripple, MD  ?   ? ?Allergies    ?Penicillins and Aspirin   ? ?Review of Systems   ?Review of Systems  ?All other systems reviewed and are negative. ? ?Physical Exam ?Updated Vital Signs ?BP (!) 124/53   Pulse (!) 50   Temp 98.2 ?F (36.8 ?C) (Oral)   Resp 13   Ht 4\' 11"  (1.499 m)   Wt 67.7 kg   SpO2 94%   BMI 30.15 kg/m?  ?Physical Exam ?Vitals and nursing note reviewed.  ?Constitutional:   ?   General: She is not in acute distress. ?   Appearance: She is well-developed. She is not diaphoretic.  ?HENT:  ?   Head: Normocephalic and atraumatic.  ?Cardiovascular:  ?   Rate and Rhythm: Normal rate and regular rhythm.  ?   Heart sounds: No murmur heard. ?  No friction rub. No gallop.  ?Pulmonary:  ?   Effort: Pulmonary effort is normal. No respiratory distress.  ?   Breath sounds: Normal breath sounds. No wheezing.  ?Abdominal:  ?   General: Bowel sounds are normal. There is no distension.  ?   Palpations: Abdomen is soft.  ?   Tenderness: There is no abdominal tenderness.  ?Musculoskeletal:     ?   General: Normal range of  motion.  ?   Cervical back: Normal range of motion and neck supple.  ?Skin: ?   General: Skin is warm and dry.  ?Neurological:  ?   General: No focal deficit present.  ?   Mental Status: She is alert and oriented to person, place, and time.  ?   Cranial Nerves: No cranial nerve deficit.  ? ? ?ED Results / Procedures / Treatments   ?Labs ?(all labs ordered are listed, but only abnormal results are displayed) ?Labs Reviewed  ?BASIC METABOLIC PANEL - Abnormal; Notable for the following components:  ?    Result Value  ? Glucose, Bld 113 (*)   ? BUN 32 (*)   ?  Creatinine, Ser 2.38 (*)   ? Calcium 8.8 (*)   ? GFR, Estimated 20 (*)   ? All other components within normal limits  ?CBC WITH DIFFERENTIAL/PLATELET - Abnormal; Notable for the following components:  ? WBC 3.7 (*)   ? RBC 3.60 (*)   ? Hemoglobin 10.5 (*)   ? HCT 34.3 (*)   ? RDW 15.9 (*)   ? All other components within normal limits  ?TROPONIN I (HIGH SENSITIVITY) - Abnormal; Notable for the following components:  ? Troponin I (High Sensitivity) 98 (*)   ? All other components within normal limits  ?TROPONIN I (HIGH SENSITIVITY) - Abnormal; Notable for the following components:  ? Troponin I (High Sensitivity) 120 (*)   ? All other components within normal limits  ? ? ?EKG ?None ? ?Radiology ?CT Head Wo Contrast ? ?Result Date: 09/09/2021 ?CLINICAL DATA:  Headache, new or worsening (Age >= 50y) EXAM: CT HEAD WITHOUT CONTRAST TECHNIQUE: Contiguous axial images were obtained from the base of the skull through the vertex without intravenous contrast. RADIATION DOSE REDUCTION: This exam was performed according to the departmental dose-optimization program which includes automated exposure control, adjustment of the mA and/or kV according to patient size and/or use of iterative reconstruction technique. COMPARISON:  09/27/2017 FINDINGS: Brain: Pain Is normal for age. No intracranial hemorrhage, mass effect, or midline shift. No hydrocephalus. The basilar cisterns are  patent. No evidence of territorial infarct or acute ischemia. No extra-axial or intracranial fluid collection. Vascular: Atherosclerosis of skullbase vasculature without hyperdense vessel or abnormal calcifica

## 2021-09-10 NOTE — ED Notes (Signed)
E-signature pad unavailable at time of pt discharge. This RN discussed discharge materials with pt and answered all pt questions. Pt stated understanding of discharge material. ? ?

## 2021-09-11 ENCOUNTER — Other Ambulatory Visit: Payer: Self-pay

## 2021-09-11 DIAGNOSIS — R1011 Right upper quadrant pain: Secondary | ICD-10-CM | POA: Diagnosis not present

## 2021-09-11 DIAGNOSIS — Z7951 Long term (current) use of inhaled steroids: Secondary | ICD-10-CM | POA: Diagnosis not present

## 2021-09-11 DIAGNOSIS — Z7901 Long term (current) use of anticoagulants: Secondary | ICD-10-CM | POA: Insufficient documentation

## 2021-09-11 DIAGNOSIS — R112 Nausea with vomiting, unspecified: Secondary | ICD-10-CM | POA: Insufficient documentation

## 2021-09-11 DIAGNOSIS — I700 Atherosclerosis of aorta: Secondary | ICD-10-CM | POA: Diagnosis not present

## 2021-09-11 DIAGNOSIS — R1010 Upper abdominal pain, unspecified: Secondary | ICD-10-CM | POA: Diagnosis not present

## 2021-09-11 DIAGNOSIS — E119 Type 2 diabetes mellitus without complications: Secondary | ICD-10-CM | POA: Diagnosis not present

## 2021-09-11 DIAGNOSIS — I10 Essential (primary) hypertension: Secondary | ICD-10-CM | POA: Insufficient documentation

## 2021-09-11 DIAGNOSIS — Z79899 Other long term (current) drug therapy: Secondary | ICD-10-CM | POA: Insufficient documentation

## 2021-09-11 DIAGNOSIS — R1013 Epigastric pain: Secondary | ICD-10-CM | POA: Diagnosis not present

## 2021-09-11 DIAGNOSIS — J45909 Unspecified asthma, uncomplicated: Secondary | ICD-10-CM | POA: Diagnosis not present

## 2021-09-11 DIAGNOSIS — R109 Unspecified abdominal pain: Secondary | ICD-10-CM | POA: Diagnosis not present

## 2021-09-12 ENCOUNTER — Emergency Department (HOSPITAL_COMMUNITY): Payer: Medicare Other

## 2021-09-12 ENCOUNTER — Emergency Department (HOSPITAL_COMMUNITY)
Admission: EM | Admit: 2021-09-12 | Discharge: 2021-09-12 | Disposition: A | Discharge status: Home / Self Care | Payer: Medicare Other | Attending: Emergency Medicine | Admitting: Emergency Medicine

## 2021-09-12 ENCOUNTER — Other Ambulatory Visit: Payer: Self-pay

## 2021-09-12 ENCOUNTER — Encounter (HOSPITAL_COMMUNITY): Payer: Self-pay | Admitting: Emergency Medicine

## 2021-09-12 DIAGNOSIS — R109 Unspecified abdominal pain: Secondary | ICD-10-CM | POA: Diagnosis not present

## 2021-09-12 DIAGNOSIS — R112 Nausea with vomiting, unspecified: Secondary | ICD-10-CM

## 2021-09-12 DIAGNOSIS — R1011 Right upper quadrant pain: Secondary | ICD-10-CM | POA: Diagnosis not present

## 2021-09-12 DIAGNOSIS — R1010 Upper abdominal pain, unspecified: Secondary | ICD-10-CM

## 2021-09-12 DIAGNOSIS — I700 Atherosclerosis of aorta: Secondary | ICD-10-CM | POA: Diagnosis not present

## 2021-09-12 LAB — CBC WITH DIFFERENTIAL/PLATELET
Abs Immature Granulocytes: 0.03 10*3/uL (ref 0.00–0.07)
Basophils Absolute: 0 10*3/uL (ref 0.0–0.1)
Basophils Relative: 0 %
Eosinophils Absolute: 0.1 10*3/uL (ref 0.0–0.5)
Eosinophils Relative: 2 %
HCT: 40.7 % (ref 36.0–46.0)
Hemoglobin: 12.5 g/dL (ref 12.0–15.0)
Immature Granulocytes: 1 %
Lymphocytes Relative: 3 %
Lymphs Abs: 0.2 10*3/uL — ABNORMAL LOW (ref 0.7–4.0)
MCH: 29.3 pg (ref 26.0–34.0)
MCHC: 30.7 g/dL (ref 30.0–36.0)
MCV: 95.3 fL (ref 80.0–100.0)
Monocytes Absolute: 0.2 10*3/uL (ref 0.1–1.0)
Monocytes Relative: 4 %
Neutro Abs: 4.3 10*3/uL (ref 1.7–7.7)
Neutrophils Relative %: 90 %
Platelets: 161 10*3/uL (ref 150–400)
RBC: 4.27 MIL/uL (ref 3.87–5.11)
RDW: 15.7 % — ABNORMAL HIGH (ref 11.5–15.5)
WBC: 4.7 10*3/uL (ref 4.0–10.5)
nRBC: 0 % (ref 0.0–0.2)

## 2021-09-12 LAB — COMPREHENSIVE METABOLIC PANEL
ALT: 7 U/L (ref 0–44)
AST: 21 U/L (ref 15–41)
Albumin: 3.5 g/dL (ref 3.5–5.0)
Alkaline Phosphatase: 34 U/L — ABNORMAL LOW (ref 38–126)
Anion gap: 11 (ref 5–15)
BUN: 35 mg/dL — ABNORMAL HIGH (ref 8–23)
CO2: 23 mmol/L (ref 22–32)
Calcium: 9.1 mg/dL (ref 8.9–10.3)
Chloride: 104 mmol/L (ref 98–111)
Creatinine, Ser: 2.34 mg/dL — ABNORMAL HIGH (ref 0.44–1.00)
GFR, Estimated: 20 mL/min — ABNORMAL LOW (ref >60)
Glucose, Bld: 128 mg/dL — ABNORMAL HIGH (ref 70–99)
Potassium: 4 mmol/L (ref 3.5–5.1)
Sodium: 138 mmol/L (ref 135–145)
Total Bilirubin: 0.7 mg/dL (ref 0.3–1.2)
Total Protein: 6.3 g/dL — ABNORMAL LOW (ref 6.5–8.1)

## 2021-09-12 LAB — URINALYSIS, ROUTINE W REFLEX MICROSCOPIC
Bilirubin Urine: NEGATIVE
Glucose, UA: NEGATIVE mg/dL
Ketones, ur: NEGATIVE mg/dL
Nitrite: NEGATIVE
Protein, ur: 100 mg/dL — AB
Specific Gravity, Urine: 1.013 (ref 1.005–1.030)
pH: 5 (ref 5.0–8.0)

## 2021-09-12 LAB — LIPASE, BLOOD: Lipase: 30 U/L (ref 11–51)

## 2021-09-12 MED ORDER — ONDANSETRON HCL 4 MG/2ML IJ SOLN
4.0000 mg | Freq: Once | INTRAMUSCULAR | Status: AC
  Administered 2021-09-12: 4 mg via INTRAVENOUS
  Filled 2021-09-12: qty 2

## 2021-09-12 MED ORDER — ONDANSETRON 4 MG PO TBDP: 4.0000 mg | ORAL_TABLET | Freq: Once | ORAL | Status: DC

## 2021-09-12 MED ORDER — FENTANYL CITRATE PF 50 MCG/ML IJ SOSY
50.0000 ug | PREFILLED_SYRINGE | Freq: Once | INTRAMUSCULAR | Status: AC
  Administered 2021-09-12: 50 ug via INTRAVENOUS
  Filled 2021-09-12: qty 1

## 2021-09-12 MED ORDER — ONDANSETRON 8 MG PO TBDP: ORAL_TABLET | ORAL | 0 refills | Status: DC

## 2021-09-12 MED ORDER — LACTATED RINGERS IV BOLUS
1000.0000 mL | Freq: Once | INTRAVENOUS | Status: AC
  Administered 2021-09-12: 1000 mL via INTRAVENOUS

## 2021-09-12 NOTE — Discharge Instructions (Signed)
Return if symptoms are getting worse. °

## 2021-09-12 NOTE — ED Provider Notes (Signed)
?[facility] ?Provider Note ? ? ?CSN: [id number] ?Arrival date & time: 09/11/21  2347 ? ?  ? ?History ? ?Chief Complaint  ?Patient presents with  ? Abdominal Pain  ? ? ?[name] is a 88 y.o. female. ? ?The history is provided by the patient.  ?Abdominal Pain ?She has history of hypertension, diabetes, hyperlipidemia, asthma, lupus and comes in complaining of upper abdominal pain and vomiting which started this evening.  She denies fever or chills or sweats.  She thought she might be constipated and took a suppository but did not get any relief. ?  ?Home Medications ?Prior to Admission medications   ?Medication Sig Start Date End Date Taking? Authorizing Provider  ?acetaminophen (TYLENOL) 500 MG tablet Take 500 mg by mouth in the morning and at bedtime.    [provider]  ?albuterol (PROVENTIL) (2.5 MG/3ML) 0.083% nebulizer solution USE 1 AMPULE EVERY 6 HOURS AS NEEDED FOR WHEEZE ?Patient taking differently: Take 2.5 mg by nebulization every 6 (six) hours as needed for shortness of breath or wheezing. 12/20/20   Collene Gobble, MD  ?albuterol (VENTOLIN HFA) 108 (90 Base) MCG/ACT inhaler USE 2 PUFFS EVERY 6 HOURS AS NEEDED FOR WHEEZING. ?Patient taking differently: Inhale 2 puffs into the lungs every 6 (six) hours as needed for shortness of breath or wheezing. 09/26/20   Collene Gobble, MD  ?amLODipine (NORVASC) 5 MG tablet TAKE 1 TABLET BY MOUTH DAILY. ?Patient taking differently: Take 5 mg by mouth every evening. 10/01/20   Nahser, Wonda Cheng, MD  ?azaTHIOprine (IMURAN) 50 MG tablet Take 25 mg by mouth at bedtime. 04/09/21   [provider]  ?Azelastine-Fluticasone (DYMISTA) 137-50 MCG/ACT SUSP Place 1 spray into the nose in the morning and at bedtime. ?Patient not taking: Reported on 09/10/2021 01/12/21   Collene Gobble, MD  ?B Complex Vitamins (VITAMIN B COMPLEX) TABS Take 1 tablet by mouth daily.    [provider]   ?Budeson-Glycopyrrol-Formoterol (BREZTRI AEROSPHERE) 160-9-4.8 MCG/ACT AERO INHALE 2 PUFFS INTO THE LUNGS TWICE DAILY, IN THE MORNING & AT BEDTIME. ?Patient taking differently: Inhale 2 puffs into the lungs in the morning and at bedtime. 07/08/21   Collene Gobble, MD  ?carvedilol (COREG) 6.25 MG tablet Take 6.25 mg by mouth daily.    [provider]  ?cetirizine (ZYRTEC) 10 MG tablet Take 10 mg by mouth daily.    [provider]  ?citalopram (CELEXA) 20 MG tablet Take 1 tablet (20 mg total) by mouth daily. ?Patient taking differently: Take 20 mg by mouth every evening. 07/11/21   Panosh, Standley Brooking, MD  ?cloNIDine (CATAPRES) 0.1 MG tablet Take 1 tablet every 12 hours as needed for blood pressures greater than 409 systolic 12/23/89   Veryl Speak, MD  ?clopidogrel (PLAVIX) 75 MG tablet TAKE ONE TABLET BY MOUTH ONCE DAILY. ?Patient taking differently: Take 75 mg by mouth every evening. 08/23/21   Panosh, Standley Brooking, MD  ?diclofenac Sodium (VOLTAREN) 1 % GEL Apply 4 g topically 4 (four) times daily. ?Patient taking differently: Apply 4 g topically in the morning and at bedtime. 01/27/21   Panosh, Standley Brooking, MD  ?doxazosin (CARDURA) 2 MG tablet Take 3 tablets (6 mg total) by mouth at bedtime. 08/22/21   Panosh, Standley Brooking, MD  ?ferrous sulfate 325 (65 FE) MG EC tablet Take 1 tablet (325 mg total) by mouth daily with breakfast. 12/15/14   Panosh, Standley Brooking, MD  ?fluticasone (FLONASE) 50 MCG/ACT nasal spray Place  2 sprays into both nostrils daily. 10/20/19   Collene Gobble, MD  ?furosemide (LASIX) 40 MG tablet Take 40-80 mg by mouth See admin instructions. 80 mg in the am and 40 mg in the pm    [provider]  ?hydrALAZINE (APRESOLINE) 50 MG tablet Take 1.5 tablets (75 mg total) by mouth 3 (three) times daily. 10/04/20   Nahser, Wonda Cheng, MD  ?LORazepam (ATIVAN) 0.5 MG tablet Take 1 tablet (0.5 mg total) by mouth 2 (two) times daily as needed for anxiety. 06/29/21   Panosh, Standley Brooking, MD  ?montelukast (SINGULAIR) 10  MG tablet TAKE ONE TABLET BY MOUTH ONCE DAILY ?Patient taking differently: Take 10 mg by mouth at bedtime. 08/23/21   Panosh, Standley Brooking, MD  ?pantoprazole (PROTONIX) 40 MG tablet Take 1 tablet (40 mg total) by mouth 2 (two) times daily. 08/23/21   Panosh, Standley Brooking, MD  ?potassium chloride (KLOR-CON) 10 MEQ tablet TAKE FOUR TABLETS BY MOUTH DAILY. ?Patient taking differently: Take 20 mEq by mouth 2 (two) times daily. 05/19/21   Panosh, Standley Brooking, MD  ?rosuvastatin (CRESTOR) 20 MG tablet Take 1 tablet (20 mg total) by mouth daily. 08/22/21   Nahser, Wonda Cheng, MD  ?Spacer/Aero-Holding Chambers (AEROCHAMBER PLUS) inhaler Use as instructed 05/26/16   Melynda Ripple, MD  ?vitamin B-12 (CYANOCOBALAMIN) 1000 MCG tablet Take 1,000 mcg by mouth daily.    [provider]  ?Vitamin D, Cholecalciferol, 25 MCG (1000 UT) TABS Take 1,000 Units by mouth daily.    [provider]  ?   ? ?Allergies    ?Penicillins and Aspirin   ? ?Review of Systems   ?Review of Systems  ?Gastrointestinal:  Positive for abdominal pain.  ?All other systems reviewed and are negative. ? ?Physical Exam ?Updated Vital Signs ?BP (!) 173/60   Pulse 66   Temp 100.2 ?F (37.9 ?C) (Oral)   Resp 16   Ht 4\' 11"  (1.499 m)   Wt 67.6 kg   SpO2 98%   BMI 30.09 kg/m?  ?Physical Exam ?Vitals and nursing note reviewed.  ?88 year old female, resting comfortably and in no acute distress. Vital signs are significant for elevated blood pressure. Oxygen saturation is 98%, which is normal. ?Head is normocephalic and atraumatic. PERRLA, EOMI. Oropharynx is clear. ?Neck is nontender and supple without adenopathy or JVD. ?Back is nontender and there is no CVA tenderness. ?Lungs are clear without rales, wheezes, or rhonchi. ?Chest is nontender. ?Heart has regular rate and rhythm without murmur. ?Abdomen is soft, flat, with moderate epigastric and right upper quadrant tenderness with +/- Murphy sign.  There is no rebound or guarding. ?Extremities have no cyanosis or  edema, full range of motion is present. ?Skin is warm and dry without rash. ?Neurologic: Mental status is normal, cranial nerves are intact, moves all extremities equally. ? ?ED Results / Procedures / Treatments   ?Labs ?(all labs ordered are listed, but only abnormal results are displayed) ?Labs Reviewed  ?COMPREHENSIVE METABOLIC PANEL - Abnormal; Notable for the following components:  ?    Result Value  ? Glucose, Bld 128 (*)   ? BUN 35 (*)   ? Creatinine, Ser 2.34 (*)   ? Total Protein 6.3 (*)   ? Alkaline Phosphatase 34 (*)   ? GFR, Estimated 20 (*)   ? All other components within normal limits  ?CBC WITH DIFFERENTIAL/PLATELET - Abnormal; Notable for the following components:  ? RDW 15.7 (*)   ? Lymphs Abs 0.2 (*)   ?  All other components within normal limits  ?LIPASE, BLOOD  ?URINALYSIS, ROUTINE W REFLEX MICROSCOPIC  ? ? ?EKG ?None ? ?Radiology ?CT ABDOMEN PELVIS WO CONTRAST ? ?Result Date: 09/12/2021 ?CLINICAL DATA:  Acute abdominal pain EXAM: CT ABDOMEN AND PELVIS WITHOUT CONTRAST TECHNIQUE: Multidetector CT imaging of the abdomen and pelvis was performed following the standard protocol without IV contrast. RADIATION DOSE REDUCTION: This exam was performed according to the departmental dose-optimization program which includes automated exposure control, adjustment of the mA and/or kV according to patient size and/or use of iterative reconstruction technique. COMPARISON:  05/16/2020 FINDINGS: Lower chest: No acute abnormality. Hepatobiliary: Liver is within normal limits. Gallbladder is well distended. Some dependent density is noted which may be related to gallbladder sludge. No discrete calcified stones are seen. Pancreas: Unremarkable. No pancreatic ductal dilatation or surrounding inflammatory changes. Spleen: Normal in size without focal abnormality. Adrenals/Urinary Tract: Adrenal glands are within normal limits. Kidneys show no renal calculi or obstructive changes. A 3.5 cm cyst is noted in the left  kidney stable from the prior study. No further follow-up is recommended. Bladder is partially distended. Stomach/Bowel: Diverticular change of the colon is noted without evidence of diverticulitis. No obstructive changes are seen. The appen

## 2021-09-12 NOTE — ED Triage Notes (Signed)
Pt c/o abd pain with n/v/d; denies urinary symptoms   ?

## 2021-09-12 NOTE — ED Notes (Signed)
Ultrasound at bedside

## 2021-09-12 NOTE — ED Provider Triage Note (Signed)
Emergency Medicine Provider Triage Evaluation Note ? [name] , a 88 y.o. female  was evaluated in triage.  Pt complains of abdominal pain today. Waxing/waning, generalized abdomen, worse in the center, associated nausea & vomiting. Took a suppository to see if this would help without relief. No blood noted in emesis/stool. ? ?Review of Systems  ?Positive: Abdominal pain, n/v ?Negative: Chest pain, dyspnea, melena, dysuria ? ?Physical Exam  ?BP (!) 175/66 (BP Location: Right Arm)   Pulse 74   Temp 100.2 ?F (37.9 ?C) (Oral)   Resp 16   Ht 4\' 11"  (1.499 m)   Wt 67.6 kg   SpO2 98%   BMI 30.09 kg/m?  ?Gen:   Awake, no distress   ?Resp:  Normal effort  ?MSK:   Moves extremities without difficulty  ?Other:  Generalized abdominal tenderness increased to epigastrium & periumbilical area ? ?Medical Decision Making  ?Medically screening exam initiated at 12:28 AM.  Appropriate orders placed.  [name] was informed that the remainder of the evaluation will be completed by another provider, this initial triage assessment does not replace that evaluation, and the importance of remaining in the ED until their evaluation is complete. ? ?Abdominal pain.  ?Last creatinine > 2.0 consistently therefore CT A/P wo contrast ordered.  ?  ?Amaryllis Dyke, PA-C ?09/12/21 0030 ? ?

## 2021-09-13 ENCOUNTER — Telehealth: Payer: Self-pay | Admitting: Internal Medicine

## 2021-09-13 DIAGNOSIS — H3581 Retinal edema: Secondary | ICD-10-CM | POA: Diagnosis not present

## 2021-09-13 DIAGNOSIS — Z79899 Other long term (current) drug therapy: Secondary | ICD-10-CM | POA: Diagnosis not present

## 2021-09-13 DIAGNOSIS — H44113 Panuveitis, bilateral: Secondary | ICD-10-CM | POA: Diagnosis not present

## 2021-09-13 DIAGNOSIS — H35033 Hypertensive retinopathy, bilateral: Secondary | ICD-10-CM | POA: Diagnosis not present

## 2021-09-13 NOTE — Telephone Encounter (Signed)
Left message for patient to call back and schedule Medicare Annual Wellness Visit (AWV) either virtually or in office. Left  my jabber number [phone] ° ° °AWV-I per [place] 05/15/09 °please schedule at anytime with [facility] Nurse Health Advisor 1 or 2 ° ° °This should be a 45 minute visit.  °

## 2021-09-25 NOTE — Progress Notes (Signed)
? ?Chief Complaint  ?Patient presents with  ? Follow-up  ? ? ?HPI: ?Gurleen Larrivee 88 y.o. come in for Chronic disease management  last visit  9 22  ?Has had  ed visits for abd pain  and bp elevation  in April May   ; rx for panuveitis  per opthal ? ?Gi feels better   some cramping.  Eating  back baked  chicken . [name] Fruit punch and goat milk .  ? ?Blood pressure evaluation in the ED was given medication and then clonidine to use if blood pressures 180 and above.  Uncertain cause was noted that she had elevated troponin went up and down.  No current chest pain shortness of breath above baseline no increased swelling. ?No syncope ? ?At last visit is addressed anxiety where she gets jitteriness in her chest and told her in addition to her citalopram she can take some lorazepam she notes that it helps her quite a bit when she feels extra anxious no other side effects reported.  Is not taking it very frequently    Now taking  as needed  about 3 days per week.  ? ?ROS: See pertinent positives and negatives per HPI. ? ?Past Medical History:  ?Diagnosis Date  ? Acute superficial venous thrombosis of left lower extremity 03/27/2014  ? concern about hx and potential extensive on exam tenderness calf and low dose lovenox 40 qd 2-4 weekselevetion and close  fu advised .   ? Allergy   ? Anemia   ? Anxiety   ? Arthritis   ? "shoulders" (09/09/2012)  ? Asthma 09/08/2012  ? Carotid bruit   ? [place] 2018  low risk 1 - 39%  ? Cataract   ? bil cateracts removed  ? Chronic kidney disease   ? Clotting disorder ([place])   ? blood clots in legs  ? Diabetes mellitus without complication ([place])   ? "Borderline" per pt; being monitored.  no meds per pt  ? Diverticulosis   ? Dyspnea 04/27/2014  ? GERD (gastroesophageal reflux disease)   ? Headache(784.0)   ? "related to my high blood pressure" (09/09/2012)  ? Hiatal hernia   ? History of DVT (deep vein thrombosis)   ? "RLE" (09/09/2012) coumadine cant take asa so on plavix   ? HTN (hypertension)  09/08/2012  ? Hyperlipidemia   ? Lupus ([place])   ? "cured years ago" (09/09/2012)  ? Other dysphagia 02/11/2013  ? Syncope and collapse 01/21/2018  ? Varicose veins   ? Varicose veins of leg with complications 26/83/4196  ? ? ?Family History  ?Problem Relation Age of Onset  ? Hypertension Mother   ? Lung cancer Mother   ? Hypertension Father   ? Lung cancer Father   ? Breast cancer Sister   ? Colon cancer Sister   ?     ? 44' s dx - died in 89's  ? Breast cancer Sister   ? Hypertension Brother   ? Rectal cancer Brother   ? Stomach cancer Brother   ? Breast cancer Brother   ? Heart attack Daughter   ? Esophageal cancer Daughter   ? Hypertension Daughter   ? Diabetes Daughter   ? Breast cancer Paternal Aunt   ? Lung cancer Other   ?     both parents   ? Pancreatic cancer Neg Hx   ? ? ?Social History  ? ?Socioeconomic History  ? Marital status: Single  ?  Spouse name: Not on file  ?  Number of children: 6  ? Years of education: Not on file  ? Highest education level: Not on file  ?Occupational History  ? Occupation: retired  ?Tobacco Use  ? Smoking status: Never  ? Smokeless tobacco: Never  ?Vaping Use  ? Vaping Use: Never used  ?Substance and Sexual Activity  ? Alcohol use: No  ? Drug use: No  ? Sexual activity: Not Currently  ?  Birth control/protection: Surgical  ?Other Topics Concern  ? Not on file  ?Social History Narrative  ? 2 people living in the home.  Grand daughter.  ? Up and down through the night  ?   ? Had 7 children  2 deceased . Bereaved parent died last year in 40s  ? 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   ? works 3 days per week currently .  ? Glasses dentures  Neg tad   ? ?Social Determinants of Health  ? ?Financial Resource Strain: Not on file  ?Food Insecurity: Not on file  ?Transportation Needs: Not on file  ?Physical Activity: Not on file  ?Stress: Not on file  ?Social Connections: Not on file  ? ? ?Outpatient Medications Prior to Visit  ?Medication Sig  Dispense Refill  ? acetaminophen (TYLENOL) 500 MG tablet Take 500 mg by mouth in the morning and at bedtime.    ? albuterol (PROVENTIL) (2.5 MG/3ML) 0.083% nebulizer solution USE 1 AMPULE EVERY 6 HOURS AS NEEDED FOR WHEEZE (Patient taking differently: Take 2.5 mg by nebulization every 6 (six) hours as needed for shortness of breath or wheezing.) 75 mL 1  ? albuterol (VENTOLIN HFA) 108 (90 Base) MCG/ACT inhaler USE 2 PUFFS EVERY 6 HOURS AS NEEDED FOR WHEEZING. 8.5 g 3  ? amLODipine (NORVASC) 5 MG tablet TAKE 1 TABLET BY MOUTH DAILY. (Patient taking differently: Take 5 mg by mouth every evening.) 90 tablet 3  ? azaTHIOprine (IMURAN) 50 MG tablet Take 25 mg by mouth at bedtime.    ? Azelastine-Fluticasone (DYMISTA) 137-50 MCG/ACT SUSP Place 1 spray into the nose in the morning and at bedtime. 23 g 6  ? B Complex Vitamins (VITAMIN B COMPLEX) TABS Take 1 tablet by mouth daily.    ? Budeson-Glycopyrrol-Formoterol (BREZTRI AEROSPHERE) 160-9-4.8 MCG/ACT AERO INHALE 2 PUFFS INTO THE LUNGS TWICE DAILY, IN THE MORNING & AT BEDTIME. (Patient taking differently: Inhale 2 puffs into the lungs in the morning and at bedtime.) 10.7 g 11  ? carvedilol (COREG) 6.25 MG tablet Take 6.25 mg by mouth daily.    ? cetirizine (ZYRTEC) 10 MG tablet Take 10 mg by mouth daily.    ? cloNIDine (CATAPRES) 0.1 MG tablet Take 1 tablet every 12 hours as needed for blood pressures greater than 973 systolic (Patient taking differently: Take 0.1 mg by mouth daily.) 15 tablet 0  ? clopidogrel (PLAVIX) 75 MG tablet TAKE ONE TABLET BY MOUTH ONCE DAILY. (Patient taking differently: Take 75 mg by mouth every evening.) 30 tablet 1  ? diclofenac Sodium (VOLTAREN) 1 % GEL Apply 4 g topically 4 (four) times daily. (Patient taking differently: Apply 4 g topically 2 (two) times daily as needed (painful areas).) 100 g 6  ? doxazosin (CARDURA) 2 MG tablet Take 3 tablets (6 mg total) by mouth at bedtime. 270 tablet 1  ? ferrous sulfate 325 (65 FE) MG EC tablet Take 1  tablet (325 mg total) by mouth daily with breakfast. 30 tablet 3  ? fluticasone (FLONASE) 50  MCG/ACT nasal spray Place 2 sprays into both nostrils daily. (Patient taking differently: Place 1 spray into both nostrils daily as needed for allergies.) 16 g 2  ? furosemide (LASIX) 40 MG tablet Take 40-80 mg by mouth See admin instructions. 80 mg in the am and 40 mg in the pm    ? hydrALAZINE (APRESOLINE) 50 MG tablet Take 1.5 tablets (75 mg total) by mouth 3 (three) times daily. 405 tablet 3  ? montelukast (SINGULAIR) 10 MG tablet TAKE ONE TABLET BY MOUTH ONCE DAILY (Patient taking differently: Take 10 mg by mouth at bedtime.) 90 tablet 1  ? ondansetron (ZOFRAN-ODT) 8 MG disintegrating tablet 8mg  ODT q4 hours prn nausea 20 tablet 0  ? pantoprazole (PROTONIX) 40 MG tablet Take 1 tablet (40 mg total) by mouth 2 (two) times daily. 180 tablet 1  ? potassium chloride (KLOR-CON) 10 MEQ tablet TAKE FOUR TABLETS BY MOUTH DAILY. (Patient taking differently: Take 20 mEq by mouth 2 (two) times daily.) 120 tablet 3  ? rosuvastatin (CRESTOR) 20 MG tablet Take 1 tablet (20 mg total) by mouth daily. (Patient taking differently: Take 20 mg by mouth at bedtime.) 90 tablet 3  ? Spacer/Aero-Holding Chambers (AEROCHAMBER PLUS) inhaler Use as instructed 1 each 2  ? vitamin B-12 (CYANOCOBALAMIN) 1000 MCG tablet Take 1,000 mcg by mouth daily.    ? Vitamin D, Cholecalciferol, 25 MCG (1000 UT) TABS Take 1,000 Units by mouth daily.    ? citalopram (CELEXA) 20 MG tablet Take 1 tablet (20 mg total) by mouth daily. (Patient taking differently: Take 20 mg by mouth every evening.) 90 tablet 0  ? LORazepam (ATIVAN) 0.5 MG tablet Take 1 tablet (0.5 mg total) by mouth 2 (two) times daily as needed for anxiety. 30 tablet 0  ? ?No facility-administered medications prior to visit.  ? ? ? ?EXAM: ? ?BP 110/60 (BP Location: Left Arm, Patient Position: Sitting, Cuff Size: Normal)   Pulse 61   Temp 98.6 ?F (37 ?C) (Oral)   Ht 4\' 11"  (1.499 m)   Wt 148 lb  (67.1 kg)   SpO2 99%   BMI 29.89 kg/m?  ? ?Body mass index is 29.89 kg/m?. ? ?GENERAL: vitals reviewed and listed above, alert, oriented, appears well hydrated and in no acute distress ?HEENT: atraumatic,  no obvious

## 2021-09-26 ENCOUNTER — Other Ambulatory Visit: Payer: Self-pay | Admitting: Emergency Medicine

## 2021-09-26 ENCOUNTER — Ambulatory Visit (INDEPENDENT_AMBULATORY_CARE_PROVIDER_SITE_OTHER): Payer: Medicare Other | Admitting: Internal Medicine

## 2021-09-26 ENCOUNTER — Other Ambulatory Visit: Payer: Self-pay | Admitting: Cardiovascular Disease

## 2021-09-26 ENCOUNTER — Encounter: Payer: Self-pay | Admitting: Internal Medicine

## 2021-09-26 VITALS — BP 110/60 | HR 61 | Temp 98.6°F | Ht 59.0 in | Wt 148.0 lb

## 2021-09-26 DIAGNOSIS — F411 Generalized anxiety disorder: Secondary | ICD-10-CM | POA: Diagnosis not present

## 2021-09-26 DIAGNOSIS — I130 Hypertensive heart and chronic kidney disease with heart failure and stage 1 through stage 4 chronic kidney disease, or unspecified chronic kidney disease: Secondary | ICD-10-CM | POA: Diagnosis not present

## 2021-09-26 DIAGNOSIS — N183 Chronic kidney disease, stage 3 unspecified: Secondary | ICD-10-CM

## 2021-09-26 DIAGNOSIS — R778 Other specified abnormalities of plasma proteins: Secondary | ICD-10-CM

## 2021-09-26 DIAGNOSIS — Z79899 Other long term (current) drug therapy: Secondary | ICD-10-CM

## 2021-09-26 MED ORDER — LORAZEPAM 0.5 MG PO TABS
0.5000 mg | ORAL_TABLET | Freq: Two times a day (BID) | ORAL | 0 refills | Status: DC | PRN
Start: 2021-09-26 — End: 2022-01-25

## 2021-09-26 MED ORDER — CITALOPRAM HYDROBROMIDE 20 MG PO TABS: 20.0000 mg | ORAL_TABLET | Freq: Every evening | ORAL | 1 refills | Status: DC

## 2021-09-26 NOTE — Patient Instructions (Signed)
Good to see  you today . ?Will make sure cardiology has shared information.  ? ?Kidney function ios stable. ?Will refill lorazepam  and can use as needed since it seems to be  quite helpful.    in addition to citalopram . ? ?Plan Follow up in about 4 months  ?

## 2021-09-26 NOTE — Assessment & Plan Note (Signed)
Continue citalopram and cautious use of lorazepam as needed see text. ?

## 2021-10-11 ENCOUNTER — Inpatient Hospital Stay: Payer: Medicare Other | Attending: Hematology

## 2021-10-11 ENCOUNTER — Inpatient Hospital Stay: Payer: Medicare Other

## 2021-10-11 ENCOUNTER — Other Ambulatory Visit: Payer: Self-pay

## 2021-10-11 VITALS — BP 158/42 | HR 62 | Temp 98.6°F

## 2021-10-11 DIAGNOSIS — D638 Anemia in other chronic diseases classified elsewhere: Secondary | ICD-10-CM

## 2021-10-11 DIAGNOSIS — N184 Chronic kidney disease, stage 4 (severe): Secondary | ICD-10-CM | POA: Insufficient documentation

## 2021-10-11 DIAGNOSIS — Z79899 Other long term (current) drug therapy: Secondary | ICD-10-CM | POA: Insufficient documentation

## 2021-10-11 DIAGNOSIS — R634 Abnormal weight loss: Secondary | ICD-10-CM | POA: Insufficient documentation

## 2021-10-11 DIAGNOSIS — R1310 Dysphagia, unspecified: Secondary | ICD-10-CM | POA: Diagnosis not present

## 2021-10-11 DIAGNOSIS — K219 Gastro-esophageal reflux disease without esophagitis: Secondary | ICD-10-CM | POA: Insufficient documentation

## 2021-10-11 DIAGNOSIS — R109 Unspecified abdominal pain: Secondary | ICD-10-CM | POA: Diagnosis not present

## 2021-10-11 DIAGNOSIS — I129 Hypertensive chronic kidney disease with stage 1 through stage 4 chronic kidney disease, or unspecified chronic kidney disease: Secondary | ICD-10-CM | POA: Diagnosis not present

## 2021-10-11 DIAGNOSIS — D631 Anemia in chronic kidney disease: Secondary | ICD-10-CM | POA: Insufficient documentation

## 2021-10-11 LAB — CBC WITH DIFFERENTIAL (CANCER CENTER ONLY)
Abs Immature Granulocytes: 0.02 10*3/uL (ref 0.00–0.07)
Basophils Absolute: 0 10*3/uL (ref 0.0–0.1)
Basophils Relative: 0 %
Eosinophils Absolute: 0.1 10*3/uL (ref 0.0–0.5)
Eosinophils Relative: 2 %
HCT: 29.9 % — ABNORMAL LOW (ref 36.0–46.0)
Hemoglobin: 9.6 g/dL — ABNORMAL LOW (ref 12.0–15.0)
Immature Granulocytes: 0 %
Lymphocytes Relative: 25 %
Lymphs Abs: 1.2 10*3/uL (ref 0.7–4.0)
MCH: 29.6 pg (ref 26.0–34.0)
MCHC: 32.1 g/dL (ref 30.0–36.0)
MCV: 92.3 fL (ref 80.0–100.0)
Monocytes Absolute: 0.4 10*3/uL (ref 0.1–1.0)
Monocytes Relative: 8 %
Neutro Abs: 3 10*3/uL (ref 1.7–7.7)
Neutrophils Relative %: 65 %
Platelet Count: 140 10*3/uL — ABNORMAL LOW (ref 150–400)
RBC: 3.24 MIL/uL — ABNORMAL LOW (ref 3.87–5.11)
RDW: 13.8 % (ref 11.5–15.5)
WBC Count: 4.7 10*3/uL (ref 4.0–10.5)
nRBC: 0 % (ref 0.0–0.2)

## 2021-10-11 MED ORDER — DARBEPOETIN ALFA 100 MCG/0.5ML IJ SOSY
100.0000 ug | PREFILLED_SYRINGE | Freq: Once | INTRAMUSCULAR | Status: AC
  Administered 2021-10-11: 100 ug via SUBCUTANEOUS
  Filled 2021-10-11: qty 0.5

## 2021-10-11 NOTE — Patient Instructions (Signed)
Darbepoetin Alfa injection ?What is this medication? ?DARBEPOETIN ALFA (dar be POE e tin  AL fa) helps your body make more red blood cells. It is used to treat anemia caused by chronic kidney failure and chemotherapy. ?This medicine may be used for other purposes; ask your health care provider or pharmacist if you have questions. ?COMMON BRAND NAME(S): Aranesp ?What should I tell my care team before I take this medication? ?They need to know if you have any of these conditions: ?blood clotting disorders or history of blood clots ?cancer patient not on chemotherapy ?cystic fibrosis ?heart disease, such as angina, heart failure, or a history of a heart attack ?hemoglobin level of 12 g/dL or greater ?high blood pressure ?low levels of folate, iron, or vitamin B12 ?seizures ?an unusual or allergic reaction to darbepoetin, erythropoietin, albumin, hamster proteins, latex, other medicines, foods, dyes, or preservatives ?pregnant or trying to get pregnant ?breast-feeding ?How should I use this medication? ?This medicine is for injection into a vein or under the skin. It is usually given by a health care professional in a hospital or clinic setting. ?If you get this medicine at home, you will be taught how to prepare and give this medicine. Use exactly as directed. Take your medicine at regular intervals. Do not take your medicine more often than directed. ?It is important that you put your used needles and syringes in a special sharps container. Do not put them in a trash can. If you do not have a sharps container, call your pharmacist or healthcare provider to get one. ?A special MedGuide will be given to you by the pharmacist with each prescription and refill. Be sure to read this information carefully each time. ?Talk to your pediatrician regarding the use of this medicine in children. While this medicine may be used in children as young as 1 month of age for selected conditions, precautions do apply. ?Overdosage: If  you think you have taken too much of this medicine contact a poison control center or emergency room at once. ?NOTE: This medicine is only for you. Do not share this medicine with others. ?What if I miss a dose? ?If you miss a dose, take it as soon as you can. If it is almost time for your next dose, take only that dose. Do not take double or extra doses. ?What may interact with this medication? ?Do not take this medicine with any of the following medications: ?epoetin alfa ?This list may not describe all possible interactions. Give your health care provider a list of all the medicines, herbs, non-prescription drugs, or dietary supplements you use. Also tell them if you smoke, drink alcohol, or use illegal drugs. Some items may interact with your medicine. ?What should I watch for while using this medication? ?Your condition will be monitored carefully while you are receiving this medicine. ?You may need blood work done while you are taking this medicine. ?This medicine may cause a decrease in vitamin B6. You should make sure that you get enough vitamin B6 while you are taking this medicine. Discuss the foods you eat and the vitamins you take with your health care professional. ?What side effects may I notice from receiving this medication? ?Side effects that you should report to your doctor or health care professional as soon as possible: ?allergic reactions like skin rash, itching or hives, swelling of the face, lips, or tongue ?breathing problems ?changes in vision ?chest pain ?confusion, trouble speaking or understanding ?feeling faint or lightheaded, falls ?high blood   pressure ?muscle aches or pains ?pain, swelling, warmth in the leg ?rapid weight gain ?severe headaches ?sudden numbness or weakness of the face, arm or leg ?trouble walking, dizziness, loss of balance or coordination ?seizures (convulsions) ?swelling of the ankles, feet, hands ?unusually weak or tired ?Side effects that usually do not require  medical attention (report to your doctor or health care professional if they continue or are bothersome): ?diarrhea ?fever, chills (flu-like symptoms) ?headaches ?nausea, vomiting ?redness, stinging, or swelling at site where injected ?This list may not describe all possible side effects. Call your doctor for medical advice about side effects. You may report side effects to FDA at 1-800-FDA-1088. ?Where should I keep my medication? ?Keep out of the reach of children and pets. ?Store in a refrigerator. Do not freeze. Do not shake. Throw away any unused portion if using a single-dose vial. Multi-dose vials can be kept in the refrigerator for up to 21 days after the initial dose. Throw away unused medicine. ?To get rid of medications that are no longer needed or have expired: ?Take the medication to a medication take-back program. Check with your pharmacy or law enforcement to find a location. ?If you cannot return the medication, ask your pharmacist or care team how to get rid of the medication safely. ?NOTE: This sheet is a summary. It may not cover all possible information. If you have questions about this medicine, talk to your doctor, pharmacist, or health care provider. ?? 2023 Elsevier/Gold Standard (2021-05-02 00:00:00) ? ?

## 2021-10-12 ENCOUNTER — Other Ambulatory Visit: Payer: Self-pay | Admitting: Emergency Medicine

## 2021-10-18 ENCOUNTER — Ambulatory Visit: Payer: Medicare Other | Admitting: Emergency Medicine

## 2021-10-18 ENCOUNTER — Telehealth: Payer: Self-pay | Admitting: Pharmacist

## 2021-10-18 ENCOUNTER — Encounter: Payer: Self-pay | Admitting: Emergency Medicine

## 2021-10-18 DIAGNOSIS — J310 Chronic rhinitis: Secondary | ICD-10-CM

## 2021-10-18 DIAGNOSIS — K219 Gastro-esophageal reflux disease without esophagitis: Secondary | ICD-10-CM

## 2021-10-18 DIAGNOSIS — R053 Chronic cough: Secondary | ICD-10-CM | POA: Insufficient documentation

## 2021-10-18 DIAGNOSIS — J45909 Unspecified asthma, uncomplicated: Secondary | ICD-10-CM | POA: Diagnosis not present

## 2021-10-18 MED ORDER — ALBUTEROL SULFATE (2.5 MG/3ML) 0.083% IN NEBU: 2.5000 mg | INHALATION_SOLUTION | Freq: Four times a day (QID) | RESPIRATORY_TRACT | 6 refills | Status: DC | PRN

## 2021-10-18 NOTE — Assessment & Plan Note (Signed)
Continue pantoprazole (Protonix) 40 mg twice a day.

## 2021-10-18 NOTE — Patient Instructions (Addendum)
Please continue Breztri 2 puffs twice a day.  Rinse and gargle after using. Keep albuterol available to use 2 puffs or 1 nebulizer treatment up to every 4 hours if needed for shortness of breath, chest tightness, wheezing.  Continue Singulair 10 mg each evening. Continue loratadine 10 mg daily Continue Dymista 2 sprays each nostril twice a day. Continue pantoprazole (Protonix) 40 mg twice a day. Follow with Dr. Lamonte Sakai in 12 months or sooner if you have any problems.

## 2021-10-18 NOTE — Addendum Note (Signed)
Addended by: Gavin Potters R on: 10/18/2021 10:37 AM   Modules accepted: Orders

## 2021-10-18 NOTE — Assessment & Plan Note (Signed)
Continue loratadine 10 mg daily Continue Dymista 2 sprays each nostril twice a day.

## 2021-10-18 NOTE — Progress Notes (Signed)
Subjective:    Patient ID: [name], female    DOB: 1933-07-13, 88 y.o.   MRN: [id number]  HPI  [name] 01/12/21 --follow-up visit for 88 year old woman with history of diabetes, lower extremity DVT, hypertension, question SLE, moderate persistent asthma with secondary pulmonary hypertension.  She has allergic rhinitis and GERD that do exacerbate cough and her asthma.  Currently managed on Breztri.  She has albuterol, uses nebs about once a week. Uses HFA about daily.  On Protonix twice daily, Singulair, Dymista started last time, ran out 1 week ago, does miss it.  Loratadine as needed, not recently.  She was treated with Pred, unsure whether it was in the last month.  She is able to walk on flat ground, sometimes SOB with longer distances and stairs.    [name] 10/18/21 --[name] is 89.  She has a history of diabetes, lower extremity DVT, hypertension, question SLE, GERD, allergic rhinitis.  We have followed her for moderate persistent asthma, chronic cough and upper airway instability, secondary pulmonary hypertension.   She was in the emergency department in April for poorly controlled hypertension. She has been managed on Breztri, uses albuterol a few times a week. Does get benefit from both.  She hears some wheeze, has some increased sinus drainage, HA especially when it is humid or rainy. She has occasional globus sensation and cough. Has intermittent hoarseness. She has not required any pred since last time. She is up to date  on flu and covid vaccines.   On Protonix twice daily, Singulair, Dymista, loratadine.   Review of Systems As per Hpi     Objective:   Physical Exam Vitals:   10/18/21 0941  BP: 128/68  Pulse: (!) 58  Temp: 98 F (36.7 C)  TempSrc: Oral  SpO2: 99%  Weight: 151 lb 6.4 oz (68.7 kg)  Height: 5' (1.524 m)   Gen: Pleasant, overweight, in no distress,  normal affect  ENT: No lesions,  mouth clear,  oropharynx clear, no postnasal drip  Neck: No JVD,  stronger voice.  No stridor or UA noise  Lungs: No use of accessory muscles, no crackles or wheezing on normal respiration  Cardiovascular: RRR, heart sounds normal, no murmur or gallops, no peripheral edema  Musculoskeletal: No deformities, no cyanosis or clubbing  Neuro: alert, awake, non focal  Skin: Warm, no lesions or rash       Assessment & Plan:  Asthma, chronic Doing quite well.  Intermittently using albuterol.  No flares since last visit in August 2022.  Her COVID-19 and flu vaccine are up-to-date and she plans to continue to get on the appropriate schedule.  Did advise her to do so this fall.  Plan to continue same regimen.  She will call if she has any flaring symptoms.  Please continue Breztri 2 puffs twice a day.  Rinse and gargle after using. Keep albuterol available to use 2 puffs or 1 nebulizer treatment up to every 4 hours if needed for shortness of breath, chest tightness, wheezing.  Continue Singulair 10 mg each evening. Follow with Dr. Lamonte Sakai in 12 months or sooner if you have any problems.   Chronic rhinitis Continue loratadine 10 mg daily Continue Dymista 2 sprays each nostril twice a day.  GERD (gastroesophageal reflux disease) Continue pantoprazole (Protonix) 40 mg twice a day.  Chronic cough Continue to manage GERD, chronic rhinitis.  She does have intermittent cough but is fairly well controlled.  Angela Apo, MD, PhD 10/18/2021, 10:03 AM [facility] [phone] or if no answer [phone]

## 2021-10-18 NOTE — Assessment & Plan Note (Signed)
Doing quite well.  Intermittently using albuterol.  No flares since last visit in August 2022.  Her COVID-19 and flu vaccine are up-to-date and she plans to continue to get on the appropriate schedule.  Did advise her to do so this fall.  Plan to continue same regimen.  She will call if she has any flaring symptoms.  Please continue Breztri 2 puffs twice a day.  Rinse and gargle after using. Keep albuterol available to use 2 puffs or 1 nebulizer treatment up to every 4 hours if needed for shortness of breath, chest tightness, wheezing.  Continue Singulair 10 mg each evening. Follow with Dr. Lamonte Sakai in 12 months or sooner if you have any problems.

## 2021-10-18 NOTE — Assessment & Plan Note (Signed)
Continue to manage GERD, chronic rhinitis.  She does have intermittent cough but is fairly well controlled.

## 2021-10-18 NOTE — Chronic Care Management (AMB) (Signed)
Chronic Care Management Pharmacy Assistant   Name: [name]  MRN: [id number] DOB: [birth year]  Reason for Encounter: Disease State / Hypertension Assessment Call   Conditions to be addressed/monitored: HLD  Recent office visits:  09/26/2021 [name] MD - Patient was seen for anxiety state and additional issues. No medication changes. Follow up in 4 months.  Recent consult visits:  10/18/2021 Baltazar Apo MD (pulmonary) - Patient was seen for Chronic asthma without complication, unspecified asthma severity, unspecified whether persistent and additional issues. Albuterol 2.5 mg nebulization every 6 hours as needed. No follow up noted.   10/11/2021  Hillery Jacks LPN - Patient was seen at [facility] for anemia of chronic disease. Medication Administered Darbepoetin.  09/13/2021 Dwana Melena MD (eye center) - Patient was seen for follow up exam. No medication changes. No follow up noted.   Hospital visits:  Patient was seen at [facility] ED on 09/12/2021(16 hours) due to Upper abdominal pain.   New?Medications Started at [facility] Discharge:?? Ondansetron 8 mg every 4 hours as needed Medication Changes at Hospital Discharge: No medication changes Medications Discontinued at Hospital Discharge: No medications discontinues Medications that remain the same after Hospital Discharge:??  -All other medications will remain the same.     Patient was seen at [facility] ED on 09/09/2021 (12 hours)  due to Hypertensive urgency. Discharge date was 09/10/2021.   New?Medications Started at [facility] Discharge:?? Clonidine 0.1 mg Take 1 tablet every 12 hours as needed for blood pressures greater than 294 systolic. Medication Changes at Hospital Discharge: No medication changes Medications Discontinued at Hospital Discharge: No medications discontinued Medications that remain the same after Hospital Discharge:??  -All other medications will remain the  same.    Patient was seen at [facility] on 09/09/2021 (16 min) due to dizziness, swelling, h/a. BP elevated.    New?Medications Started at [facility] Discharge:?? No medications started Medication Changes at Hospital Discharge: No medication changes Medications Discontinued at Hospital Discharge: No medications discontinued Medications that remain the same after Hospital Discharge:??  -All other medications will remain the same.   Medications: Outpatient Encounter Medications as of 10/18/2021  Medication Sig   acetaminophen (TYLENOL) 500 MG tablet Take 500 mg by mouth in the morning and at bedtime.   albuterol (PROVENTIL) (2.5 MG/3ML) 0.083% nebulizer solution Take 3 mLs (2.5 mg total) by nebulization every 6 (six) hours as needed for shortness of breath or wheezing.   albuterol (VENTOLIN HFA) 108 (90 Base) MCG/ACT inhaler USE 2 PUFFS EVERY 6 HOURS AS NEEDED FOR WHEEZING.   amLODipine (NORVASC) 5 MG tablet Take 1 tablet (5 mg total) by mouth daily.   azaTHIOprine (IMURAN) 50 MG tablet Take 25 mg by mouth at bedtime.   Azelastine-Fluticasone (DYMISTA) 137-50 MCG/ACT SUSP Place 1 spray into the nose in the morning and at bedtime.   B Complex Vitamins (VITAMIN B COMPLEX) TABS Take 1 tablet by mouth daily.   Budeson-Glycopyrrol-Formoterol (BREZTRI AEROSPHERE) 160-9-4.8 MCG/ACT AERO INHALE 2 PUFFS INTO THE LUNGS TWICE DAILY, IN THE MORNING & AT BEDTIME. (Patient taking differently: Inhale 2 puffs into the lungs in the morning and at bedtime.)   carvedilol (COREG) 6.25 MG tablet Take 6.25 mg by mouth daily.   cetirizine (ZYRTEC) 10 MG tablet Take 10 mg by mouth daily.   citalopram (CELEXA) 20 MG tablet Take 1 tablet (20 mg total) by mouth every evening.   cloNIDine (CATAPRES) 0.1 MG tablet Take 1 tablet every 12  hours as needed for blood pressures greater than 364 systolic (Patient taking differently: Take 0.1 mg by mouth daily.)   clopidogrel (PLAVIX) 75 MG tablet TAKE ONE TABLET BY  MOUTH ONCE DAILY. (Patient taking differently: Take 75 mg by mouth every evening.)   diclofenac Sodium (VOLTAREN) 1 % GEL Apply 4 g topically 4 (four) times daily. (Patient taking differently: Apply 4 g topically 2 (two) times daily as needed (painful areas).)   doxazosin (CARDURA) 2 MG tablet Take 3 tablets (6 mg total) by mouth at bedtime.   ferrous sulfate 325 (65 FE) MG EC tablet Take 1 tablet (325 mg total) by mouth daily with breakfast.   fluticasone (FLONASE) 50 MCG/ACT nasal spray Place 2 sprays into both nostrils daily. (Patient taking differently: Place 1 spray into both nostrils daily as needed for allergies.)   furosemide (LASIX) 40 MG tablet Take 40-80 mg by mouth See admin instructions. 80 mg in the am and 40 mg in the pm   hydrALAZINE (APRESOLINE) 50 MG tablet Take 1.5 tablets (75 mg total) by mouth 3 (three) times daily.   LORazepam (ATIVAN) 0.5 MG tablet Take 1 tablet (0.5 mg total) by mouth 2 (two) times daily as needed for anxiety.   montelukast (SINGULAIR) 10 MG tablet TAKE ONE TABLET BY MOUTH ONCE DAILY (Patient not taking: Reported on 10/18/2021)   ondansetron (ZOFRAN-ODT) 8 MG disintegrating tablet 8mg  ODT q4 hours prn nausea   pantoprazole (PROTONIX) 40 MG tablet Take 1 tablet (40 mg total) by mouth 2 (two) times daily.   potassium chloride (KLOR-CON) 10 MEQ tablet TAKE FOUR TABLETS BY MOUTH DAILY. (Patient taking differently: Take 20 mEq by mouth 2 (two) times daily.)   rosuvastatin (CRESTOR) 20 MG tablet Take 1 tablet (20 mg total) by mouth daily. (Patient taking differently: Take 20 mg by mouth at bedtime.)   Spacer/Aero-Holding Chambers (AEROCHAMBER PLUS) inhaler Use as instructed   vitamin B-12 (CYANOCOBALAMIN) 1000 MCG tablet Take 1,000 mcg by mouth daily.   Vitamin D, Cholecalciferol, 25 MCG (1000 UT) TABS Take 1,000 Units by mouth daily.   No facility-administered encounter medications on file as of 10/18/2021.  Fill History: albuterol sulfate HFA 90 mcg/actuation  aerosol inhaler 09/26/2021 25   atorvastatin 20 mg tablet 07/07/2021 90   azelastine-fluticasone 137 mcg-50 mcg/spray nasal spray 09/30/2021 30   Breztri Aerosphere 160 mcg-61mcg-4.8mcg/actuation HFA aerosol inhaler 06/17/2021 30   carvedilol 6.25 mg tablet 09/26/2021 90   citalopram 20 mg tablet 09/26/2021 90   clopidogrel 75 mg tablet 09/21/2021 30   doxazosin 2 mg tablet 09/21/2021 30   furosemide 40 mg tablet 05/17/2021 90   lorazepam 0.5 mg tablet 09/26/2021 30   montelukast 10 mg tablet 08/23/2021 90   ondansetron 8 mg disintegrating tablet 09/12/2021 8   pantoprazole 40 mg tablet,delayed release 08/23/2021 90   potassium chloride ER 10 mEq tablet,extended release 10/05/2021 30   rosuvastatin 20 mg tablet 08/22/2021 90   amlodipine 5 mg tablet 09/27/2021 90   azathioprine 50 mg tablet 09/26/2021 60   clonidine HCl 0.1 mg tablet 09/10/2021 7   cyclosporine (Restasis) 0.05% dropperette 10/13/2021 90   hydralazine 50 mg tablet 09/21/2021 30   Reviewed chart prior to disease state call. Spoke with patient regarding BP  Recent Office Vitals: BP Readings from Last 3 Encounters:  10/18/21 128/68  10/11/21 (!) 158/42  09/26/21 110/60   Pulse Readings from Last 3 Encounters:  10/18/21 (!) 58  10/11/21 62  09/26/21 61    Wt Readings from  Last 3 Encounters:  10/18/21 151 lb 6.4 oz (68.7 kg)  09/26/21 148 lb (67.1 kg)  09/12/21 149 lb (67.6 kg)     Kidney Function Lab Results  Component Value Date/Time   CREATININE 2.34 (H) 09/12/2021 12:27 AM   CREATININE 2.38 (H) 09/09/2021 06:39 PM   CREATININE 2.31 (H) 03/10/2020 11:49 AM   CREATININE 1.4 (H) 06/19/2016 01:01 PM   CREATININE 1.6 (H) 08/16/2015 11:36 AM   GFR 19.22 (L) 06/29/2021 02:15 PM   GFRNONAA 20 (L) 09/12/2021 12:27 AM   GFRNONAA 19 (L) 03/10/2020 11:49 AM   GFRAA 19 11/04/2020 12:00 AM   GFRAA 22 (L) 03/10/2020 11:49 AM       Latest Ref Rng & Units 09/12/2021   12:27 AM 09/09/2021    6:39  PM 06/29/2021    2:15 PM  BMP  Glucose 70 - 99 mg/dL 128   113   120    BUN 8 - 23 mg/dL 35   32   50    Creatinine 0.44 - 1.00 mg/dL 2.34   2.38   2.27    Sodium 135 - 145 mmol/L 138   138   141    Potassium 3.5 - 5.1 mmol/L 4.0   3.6   3.6    Chloride 98 - 111 mmol/L 104   108   106    CO2 22 - 32 mmol/L 23   25   25     Calcium 8.9 - 10.3 mg/dL 9.1   8.8   9.4      Current antihypertensive regimen:  Amlodipine 5 mg daily Doxazosin 2 mg 3 tablets at bedtime Carvedilol 6.25 mg daily Hydralazine 50 mg 1.5 tablets three times daily Furosemide 40 mg 2 tablets in AM and 1 tablet in PM   How often are you checking your Blood Pressure? Patients granddaughter states she check her blood pressure twice daily  Current home BP readings: Patients granddaughter states her blood pressures have recently been in good range, she doesn't recall any actual readings.   What recent interventions/DTPs have been made by any provider to improve Blood Pressure control since last CPP Visit: No recent interventions  Any recent hospitalizations or ED visits since last visit with CPP? Yes, see notes above. On 09/09/21 and 09/12/21  What diet changes have been made to improve Blood Pressure Control?  Patient follows no specific diet, lower salt Breakfast - patient will have boiled egg,and toast or oatmeal Lunch - patient will have a variety Dinner - patient will have a meal that contains a meat and vegetables Snack - patient will have occasional snack of fruit or crackers  What exercise is being done to improve your Blood Pressure Control?  Patients granddaughter states will go for a walk with a friend, she will do housework and their apartment is on the upper level so she does have to climb stairs.   Adherence Review: Is the patient currently on ACE/ARB medication? No Does the patient have >5 day gap between last estimated fill dates? No  Care Gaps: AWV - previous message to Ramond Craver Last BP -  128/68 on 10/18/2021 Last HGA1C - 5.7 on 06/29/2021  Star Rating Drugs: Rosuvastatin 20 mg - last filled 08/22/2021 90 DS at [facility] Pharmacist Assistant [phone]

## 2021-10-19 ENCOUNTER — Other Ambulatory Visit: Payer: Self-pay | Admitting: Cardiovascular Disease

## 2021-10-21 ENCOUNTER — Telehealth: Payer: Self-pay | Admitting: Internal Medicine

## 2021-10-21 NOTE — Telephone Encounter (Signed)
Left message for patient to call back and schedule Medicare Annual Wellness Visit (AWV) either virtually or in office. Left  my jabber number [phone] ° ° °AWV-I per [place] 05/15/09 °please schedule at anytime with [facility] Nurse Health Advisor 1 or 2 ° ° °This should be a 45 minute visit.  °

## 2021-10-25 ENCOUNTER — Other Ambulatory Visit: Payer: Self-pay | Admitting: Internal Medicine

## 2021-10-27 ENCOUNTER — Other Ambulatory Visit: Payer: Self-pay | Admitting: Internal Medicine

## 2021-11-01 ENCOUNTER — Other Ambulatory Visit: Payer: Self-pay | Admitting: Internal Medicine

## 2021-11-09 DIAGNOSIS — N184 Chronic kidney disease, stage 4 (severe): Secondary | ICD-10-CM | POA: Diagnosis not present

## 2021-11-09 DIAGNOSIS — N189 Chronic kidney disease, unspecified: Secondary | ICD-10-CM | POA: Diagnosis not present

## 2021-11-09 DIAGNOSIS — D631 Anemia in chronic kidney disease: Secondary | ICD-10-CM | POA: Diagnosis not present

## 2021-11-09 DIAGNOSIS — I129 Hypertensive chronic kidney disease with stage 1 through stage 4 chronic kidney disease, or unspecified chronic kidney disease: Secondary | ICD-10-CM | POA: Diagnosis not present

## 2021-11-09 DIAGNOSIS — Z86718 Personal history of other venous thrombosis and embolism: Secondary | ICD-10-CM | POA: Diagnosis not present

## 2021-11-09 DIAGNOSIS — N2581 Secondary hyperparathyroidism of renal origin: Secondary | ICD-10-CM | POA: Diagnosis not present

## 2021-11-16 LAB — BASIC METABOLIC PANEL
BUN: 41 — AB (ref 4–21)
Chloride: 103 (ref 99–108)
Creatinine: 2.8 — AB (ref 0.5–1.1)
Glucose: 110
Potassium: 3.7 mEq/L (ref 3.5–5.1)
Sodium: 138 (ref 137–147)

## 2021-11-16 LAB — IRON,TIBC AND FERRITIN PANEL
Ferritin: 172
Iron: 70
TIBC: 188
UIBC: 118

## 2021-11-16 LAB — CBC AND DIFFERENTIAL
HCT: 32 — AB (ref 36–46)
Hemoglobin: 10.8 — AB (ref 12.0–16.0)
Neutrophils Absolute: 1.9
Platelets: 177 10*3/uL (ref 150–400)
WBC: 3.2

## 2021-11-16 LAB — COMPREHENSIVE METABOLIC PANEL
Albumin: 3.9 (ref 3.5–5.0)
Calcium: 9.3 (ref 8.7–10.7)
eGFR: 16

## 2021-11-16 LAB — PROTEIN / CREATININE RATIO, URINE
Albumin, U: 320.5
Creatinine, Urine: 25.9

## 2021-11-16 LAB — CBC: RBC: 3.74 — AB (ref 3.87–5.11)

## 2021-11-18 ENCOUNTER — Encounter: Payer: Self-pay | Admitting: Internal Medicine

## 2021-11-21 ENCOUNTER — Other Ambulatory Visit: Payer: Self-pay | Admitting: Hematology

## 2021-11-21 ENCOUNTER — Inpatient Hospital Stay: Payer: Medicare Other

## 2021-11-21 ENCOUNTER — Other Ambulatory Visit: Payer: Self-pay

## 2021-11-21 ENCOUNTER — Inpatient Hospital Stay: Payer: Medicare Other | Attending: Hematology

## 2021-11-21 ENCOUNTER — Other Ambulatory Visit: Payer: Medicare Other

## 2021-11-21 VITALS — BP 135/46 | HR 58 | Temp 98.6°F | Resp 18

## 2021-11-21 DIAGNOSIS — I129 Hypertensive chronic kidney disease with stage 1 through stage 4 chronic kidney disease, or unspecified chronic kidney disease: Secondary | ICD-10-CM | POA: Diagnosis not present

## 2021-11-21 DIAGNOSIS — K219 Gastro-esophageal reflux disease without esophagitis: Secondary | ICD-10-CM | POA: Insufficient documentation

## 2021-11-21 DIAGNOSIS — D631 Anemia in chronic kidney disease: Secondary | ICD-10-CM | POA: Insufficient documentation

## 2021-11-21 DIAGNOSIS — D638 Anemia in other chronic diseases classified elsewhere: Secondary | ICD-10-CM

## 2021-11-21 DIAGNOSIS — Z79899 Other long term (current) drug therapy: Secondary | ICD-10-CM | POA: Insufficient documentation

## 2021-11-21 DIAGNOSIS — N184 Chronic kidney disease, stage 4 (severe): Secondary | ICD-10-CM | POA: Insufficient documentation

## 2021-11-21 LAB — CBC WITH DIFFERENTIAL (CANCER CENTER ONLY)
Abs Immature Granulocytes: 0.01 10*3/uL (ref 0.00–0.07)
Basophils Absolute: 0 10*3/uL (ref 0.0–0.1)
Basophils Relative: 1 %
Eosinophils Absolute: 0.1 10*3/uL (ref 0.0–0.5)
Eosinophils Relative: 3 %
HCT: 31.4 % — ABNORMAL LOW (ref 36.0–46.0)
Hemoglobin: 10.3 g/dL — ABNORMAL LOW (ref 12.0–15.0)
Immature Granulocytes: 0 %
Lymphocytes Relative: 30 %
Lymphs Abs: 1.1 10*3/uL (ref 0.7–4.0)
MCH: 29.7 pg (ref 26.0–34.0)
MCHC: 32.8 g/dL (ref 30.0–36.0)
MCV: 90.5 fL (ref 80.0–100.0)
Monocytes Absolute: 0.3 10*3/uL (ref 0.1–1.0)
Monocytes Relative: 8 %
Neutro Abs: 2.1 10*3/uL (ref 1.7–7.7)
Neutrophils Relative %: 58 %
Platelet Count: 164 10*3/uL (ref 150–400)
RBC: 3.47 MIL/uL — ABNORMAL LOW (ref 3.87–5.11)
RDW: 13.8 % (ref 11.5–15.5)
WBC Count: 3.6 10*3/uL — ABNORMAL LOW (ref 4.0–10.5)
nRBC: 0 % (ref 0.0–0.2)

## 2021-11-21 MED ORDER — DARBEPOETIN ALFA 100 MCG/0.5ML IJ SOSY
100.0000 ug | PREFILLED_SYRINGE | Freq: Once | INTRAMUSCULAR | Status: AC
  Administered 2021-11-21: 100 ug via SUBCUTANEOUS
  Filled 2021-11-21: qty 0.5

## 2021-11-21 NOTE — Patient Instructions (Signed)
Darbepoetin Alfa (Aranesp)injection What is this medication? DARBEPOETIN ALFA (dar be POE e tin  AL fa) helps your body make more red blood cells. It is used to treat anemia caused by chronic kidney failure and chemotherapy. This medicine may be used for other purposes; ask your health care provider or pharmacist if you have questions. COMMON BRAND NAME(S): Aranesp What should I tell my care team before I take this medication? They need to know if you have any of these conditions: blood clotting disorders or history of blood clots cancer patient not on chemotherapy cystic fibrosis heart disease, such as angina, heart failure, or a history of a heart attack hemoglobin level of 12 g/dL or greater high blood pressure low levels of folate, iron, or vitamin B12 seizures an unusual or allergic reaction to darbepoetin, erythropoietin, albumin, hamster proteins, latex, other medicines, foods, dyes, or preservatives pregnant or trying to get pregnant breast-feeding How should I use this medication? This medicine is for injection into a vein or under the skin. It is usually given by a health care professional in a hospital or clinic setting. If you get this medicine at home, you will be taught how to prepare and give this medicine. Use exactly as directed. Take your medicine at regular intervals. Do not take your medicine more often than directed. It is important that you put your used needles and syringes in a special sharps container. Do not put them in a trash can. If you do not have a sharps container, call your pharmacist or healthcare provider to get one. A special MedGuide will be given to you by the pharmacist with each prescription and refill. Be sure to read this information carefully each time. Talk to your pediatrician regarding the use of this medicine in children. While this medicine may be used in children as young as 56 month of age for selected conditions, precautions do  apply. Overdosage: If you think you have taken too much of this medicine contact a poison control center or emergency room at once. NOTE: This medicine is only for you. Do not share this medicine with others. What if I miss a dose? If you miss a dose, take it as soon as you can. If it is almost time for your next dose, take only that dose. Do not take double or extra doses. What may interact with this medication? Do not take this medicine with any of the following medications: epoetin alfa This list may not describe all possible interactions. Give your health care provider a list of all the medicines, herbs, non-prescription drugs, or dietary supplements you use. Also tell them if you smoke, drink alcohol, or use illegal drugs. Some items may interact with your medicine. What should I watch for while using this medication? Your condition will be monitored carefully while you are receiving this medicine. You may need blood work done while you are taking this medicine. This medicine may cause a decrease in vitamin B6. You should make sure that you get enough vitamin B6 while you are taking this medicine. Discuss the foods you eat and the vitamins you take with your health care professional. What side effects may I notice from receiving this medication? Side effects that you should report to your doctor or health care professional as soon as possible: allergic reactions like skin rash, itching or hives, swelling of the face, lips, or tongue breathing problems changes in vision chest pain confusion, trouble speaking or understanding feeling faint or lightheaded, falls high blood  pressure muscle aches or pains pain, swelling, warmth in the leg rapid weight gain severe headaches sudden numbness or weakness of the face, arm or leg trouble walking, dizziness, loss of balance or coordination seizures (convulsions) swelling of the ankles, feet, hands unusually weak or tired Side effects that  usually do not require medical attention (report to your doctor or health care professional if they continue or are bothersome): diarrhea fever, chills (flu-like symptoms) headaches nausea, vomiting redness, stinging, or swelling at site where injected This list may not describe all possible side effects. Call your doctor for medical advice about side effects. You may report side effects to FDA at 1-800-FDA-1088. Where should I keep my medication? Keep out of the reach of children and pets. Store in a refrigerator. Do not freeze. Do not shake. Throw away any unused portion if using a single-dose vial. Multi-dose vials can be kept in the refrigerator for up to 21 days after the initial dose. Throw away unused medicine. To get rid of medications that are no longer needed or have expired: Take the medication to a medication take-back program. Check with your pharmacy or law enforcement to find a location. If you cannot return the medication, ask your pharmacist or care team how to get rid of the medication safely. NOTE: This sheet is a summary. It may not cover all possible information. If you have questions about this medicine, talk to your doctor, pharmacist, or health care provider.  2023 Elsevier/Gold Standard (2021-05-02 00:00:00)

## 2021-11-22 ENCOUNTER — Other Ambulatory Visit: Payer: Medicare Other

## 2021-11-22 DIAGNOSIS — Z79899 Other long term (current) drug therapy: Secondary | ICD-10-CM

## 2021-11-23 LAB — LIPID PANEL
Chol/HDL Ratio: 2.2 ratio (ref 0.0–4.4)
Cholesterol, Total: 174 mg/dL (ref 100–199)
HDL: 79 mg/dL (ref >39)
LDL Chol Calc (NIH): 79 mg/dL (ref 0–99)
Triglycerides: 87 mg/dL (ref 0–149)
VLDL Cholesterol Cal: 16 mg/dL (ref 5–40)

## 2021-11-23 LAB — ALT: ALT: 5 IU/L (ref 0–32)

## 2021-11-24 ENCOUNTER — Telehealth: Payer: Self-pay | Admitting: Internal Medicine

## 2021-11-24 NOTE — Telephone Encounter (Signed)
Left message for patient to call back and schedule Medicare Annual Wellness Visit (AWV) either virtually or in office. Left  my jabber number [phone]   ; AWV-I per [place] please schedule at anytime with [facility] Nurse Health Advisor 1 or 2   This should be a 45 minute visit.

## 2021-11-28 ENCOUNTER — Other Ambulatory Visit: Payer: Self-pay | Admitting: Internal Medicine

## 2021-11-28 DIAGNOSIS — Z1231 Encounter for screening mammogram for malignant neoplasm of breast: Secondary | ICD-10-CM

## 2021-12-05 ENCOUNTER — Encounter: Payer: Self-pay | Admitting: Cardiovascular Disease

## 2021-12-05 NOTE — Progress Notes (Unsigned)
Cardiology    Patient ID: [name] MRN: [id number]; DOB: 1934-02-04    Primary Care Provider: Burnis Medin, MD Primary Cardiologist: Mertie Moores, MD  Primary Electrophysiologist:  None   Problem list 1.  Hypertension 2.  Hyperlipidemia 3.  Prior DVT 4.  Lupus 5.  Chronic kidney disease 6.  Syncope 7.  Leg edema   Chief Complaint:  Syncope   Patient Profile:   [name] is a 88 y.o. female with history of hypertension and hyperlipidemia.  She was recently seen by Richardson Dopp, PA for an episode of syncope.  Seen in the past for an episode of leg swelling in 2015.      [name] is seen for further evaluation following her episode of syncope in July.  During that time, her metoprolol had just been changed to carvedilol.   Echocardiogram revealed normal left ventricular systolic function.  She has moderate pulmonary hypertension with an estimated PA pressure of 45. V Q scan was low risk for pulmonary embolus. She had a 30 day monitor that revealed normal sinus rhythm and sinus bradycardia.  She had one 5 beat run of ventricular ectopy which was not significant.  No further episodes of syncope  BP is still elevated.   Still eats some salty foods.   July, 23, 2020 :  [name] is seen for follow up visit BP is still mildy elevated.  Was hospitalized this past weekend.   Has vomitting and diarrhea.   These have resolved.  Troponin was 0.12   Still eating a very salty diet  Legs remains swollen .   Denies any chest pain .  Breathing is better.  Last echo was 5/19 which revealed normal LV function. Moderate pulmonary HTN - PA pressure of 45 .   August 06, 2019:  [name] is seen today for follow-up visit.  She has a history of hypertension.  She has a history of moderate pulmonary hypertension with an estimated PA pressure of 45. In the past she has been eating a very high salt diet.  She is doing well.  She is not had any further episodes of syncope  or presyncope.  She is not had any episodes of chest pain or shortness of breath.  She is been trying to avoid eating excessive salt.  Her blood pressure and heart rate are well controlled in the office visit today.  March 21, 2021: [name] is seen today for follow-up of her hypertension, chronic diastolic congestive heart failure, moderate pulmonary hypertension with an estimated PA pressure of 45 mm of mercury.  She has been eating a very high salt diet in the past. She days she is eating better now. No CP or dyspnea  Her leg swelling has improved.  Has not taken her BP meds this am.   December 06, 2021 Seen with daugher , [name] is seen for HTN, chronic diastolic chf, moderate pulmonary HTN She has had some heart racing symptoms   Overall breathing is ok  Has occasional ches pain ( when she lies back ) Has palpitations ( HR faster than normal )  Possibly associated with 3 recent deaths in her family .     Past Medical History:  Diagnosis Date   Acute superficial venous thrombosis of left lower extremity 03/27/2014   concern about hx and potential extensive on exam tenderness calf and low dose lovenox 40 qd 2-4 weekselevetion and close  fu advised .    Allergy    Anemia  Anxiety    Arthritis    "shoulders" (09/09/2012)   Asthma 09/08/2012   Carotid bruit    [place] 2018  low risk 1 - 39%   Cataract    bil cateracts removed   Chronic kidney disease    Clotting disorder ([place])    blood clots in legs   Diabetes mellitus without complication ([place])    "Borderline" per pt; being monitored.  no meds per pt   Diverticulosis    Dyspnea 04/27/2014   GERD (gastroesophageal reflux disease)    Headache(784.0)    "related to my high blood pressure" (09/09/2012)   Hiatal hernia    History of DVT (deep vein thrombosis)    "RLE" (09/09/2012) coumadine cant take asa so on plavix    HTN (hypertension) 09/08/2012   Hyperlipidemia    Lupus ([place])    "cured years ago" (09/09/2012)   Other  dysphagia 02/11/2013   Syncope and collapse 01/21/2018   Varicose veins    Varicose veins of leg with complications 49/20/1007    Past Surgical History:  Procedure Laterality Date   ABDOMINAL HYSTERECTOMY     partial   BREAST EXCISIONAL BIOPSY Right    BREAST SURGERY     CARPAL TUNNEL RELEASE Right 2000's   CARPAL TUNNEL RELEASE Bilateral 10/21/2013   Procedure: BILATERAL  CARPAL TUNNEL RELEASE;  Surgeon: [name], MD;  Location: [facility];  Service: Orthopedics;  Laterality: Bilateral;   COLONOSCOPY     SHOULDER OPEN ROTATOR CUFF REPAIR Bilateral 2000's   TRIGGER FINGER RELEASE Right 10/21/2013   Procedure: RELEASE TRIGGER FINGER/A-1 PULLEY RIGHT MIDDLE AND RIGHT RING;  Surgeon: [name], MD;  Location: [facility];  Service: Orthopedics;  Laterality: Right;   UPPER GASTROINTESTINAL ENDOSCOPY       Medications Prior to Admission: Prior to Admission medications   Medication Sig Start Date End Date Taking? Authorizing Provider  acetaminophen (TYLENOL) 500 MG tablet Take 1,000 mg by mouth every 6 (six) hours as needed for moderate pain. Reported on 04/30/2015   Yes [provider]  albuterol (PROVENTIL) (2.5 MG/3ML) 0.083% nebulizer solution USE 1 AMPULE EVERY 6 HOURS AS NEEDED FOR WHEEZE 10/23/17  Yes Panosh, Standley Brooking, MD  amLODipine-olmesartan (AZOR) 10-40 MG tablet Take 1 tablet by mouth daily.   Yes Madelon Lips, MD  carvedilol (COREG) 12.5 MG tablet Take 6.25 mg by mouth daily. 1/2 tablet daily   Yes Madelon Lips, MD  citalopram (CELEXA) 20 MG tablet TAKE 1 TABLET ONCE DAILY. 12/14/17  Yes Panosh, Standley Brooking, MD  clopidogrel (PLAVIX) 75 MG tablet TAKE 1 TABLET ONCE DAILY. 10/19/17  Yes Panosh, Standley Brooking, MD  diclofenac sodium (VOLTAREN) 1 % GEL APPLY 4GM TO AFFECTED AREA(S) 4 TIMES A DAY. 07/13/17  Yes Panosh, Standley Brooking, MD  doxazosin (CARDURA) 2 MG tablet TAKE 3 TABLETS AT BEDTIME. 12/13/17  Yes Panosh, Standley Brooking, MD  fenofibrate 160 MG tablet  TAKE 1 TABLET ONCE DAILY. 12/13/17  Yes Panosh, Standley Brooking, MD  ferrous sulfate 325 (65 FE) MG EC tablet Take 1 tablet (325 mg total) by mouth daily with breakfast. 12/15/14  Yes Panosh, Standley Brooking, MD  fluorometholone (FML) 0.1 % ophthalmic suspension Place 1 drop into both eyes 2 (two) times daily.  09/13/17  Yes [provider]  Fluticasone-Salmeterol (ADVAIR) 250-50 MCG/DOSE AEPB USE 1 PUFF TWICE DAILY. 11/09/17  Yes Panosh, Standley Brooking, MD  furosemide (LASIX) 80 MG tablet Take 80 mg by mouth daily.  Yes Madelon Lips, MD  hydrALAZINE (APRESOLINE) 25 MG tablet Take 1 tablet by mouth daily. 12/03/17  Yes [provider]  lidocaine (LIDODERM) 5 % Place 1 patch onto the skin daily. Remove & Discard patch within 12 hours or as directed by MD Patient taking differently: Place 1 patch onto the skin daily as needed (pain). Remove & Discard patch within 12 hours or as directed by MD 01/04/17  Yes [place], Orson Ape, FNP  LORazepam (ATIVAN) 1 MG tablet Take 1 tablet (1 mg total) by mouth every 8 (eight) hours as needed for anxiety. Avoid regular use 08/28/17  Yes Panosh, Standley Brooking, MD  montelukast (SINGULAIR) 10 MG tablet TAKE 1 TABLET ONCE DAILY. 10/31/17  Yes Panosh, Standley Brooking, MD  pantoprazole (PROTONIX) 40 MG tablet TAKE (1) TABLET TWICE A DAY BEFORE MEALS. 10/23/17  Yes Panosh, Standley Brooking, MD  potassium chloride (K-DUR,KLOR-CON) 10 MEQ tablet Take 10 mEq by mouth 4 (four) times daily.   Yes [provider]  PROAIR HFA 108 (90 Base) MCG/ACT inhaler USE 2 PUFFS EVERY 6 HOURS AS NEEDED FOR WHEEZING. 12/13/17  Yes Panosh, Standley Brooking, MD  RESTASIS 0.05 % ophthalmic emulsion Place 1 drop into both eyes 2 (two) times daily.  01/01/15  Yes [provider]  Spacer/Aero-Holding Chambers (AEROCHAMBER PLUS) inhaler Use as instructed 05/26/16  Yes Melynda Ripple, MD  vitamin B-12 (CYANOCOBALAMIN) 1000 MCG tablet Take 1,000 mcg by mouth daily.   Yes [provider]     Allergies:    Allergies   Allergen Reactions   Penicillins Nausea And Vomiting and Other (See Comments)    Has patient had a PCN reaction causing immediate rash, facial/tongue/throat swelling, SOB or lightheadedness with hypotension: Y Has patient had a PCN reaction causing severe rash involving mucus membranes or skin necrosis: Y Has patient had a PCN reaction that required hospitalization: N Has patient had a PCN reaction occurring within the last 10 years: N If all of the above answers are "NO", then may proceed with Cephalosporin use.    Aspirin Other (See Comments)    Wheezing Acetaminophen is OK    Social History:   Social History   Socioeconomic History   Marital status: Single    Spouse name: Not on file   Number of children: 6   Years of education: Not on file   Highest education level: Not on file  Occupational History   Occupation: retired  Tobacco Use   Smoking status: Never   Smokeless tobacco: Never  Vaping Use   Vaping Use: Never used  Substance and Sexual Activity   Alcohol use: No   Drug use: No   Sexual activity: Not Currently    Birth control/protection: Surgical  Other Topics Concern   Not on file  Social History Narrative   2 people living in the home.  Grand daughter.   Up and down through the night      Had 7 children  2 deceased . 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    works 3 days per week currently .   Glasses dentures  Neg tad    Social Determinants of Health   Financial Resource Strain: Low Risk  (01/01/2020)   Overall Financial Resource Strain (CARDIA)    Difficulty of Paying Living Expenses: Not hard at all  Food Insecurity: Not on file  Transportation Needs: No Transportation Needs (01/01/2020)  PRAPARE - Hydrologist (Medical): No    Lack of Transportation (Non-Medical): No  Physical Activity: Not on file  Stress: Not on file  Social Connections:  Not on file  Intimate Partner Violence: Not on file    Family History:   The patient's family history includes Breast cancer in her brother, paternal aunt, sister, and sister; Colon cancer in her sister; Diabetes in her daughter; Esophageal cancer in her daughter; Heart attack in her daughter; Hypertension in her brother, daughter, father, and mother; Lung cancer in her father, mother, and another family member; Rectal cancer in her brother; Stomach cancer in her brother. There is no history of Pancreatic cancer.    ROS:  Please see the history of present illness.  All other ROS reviewed and negative.     Physical Exam/Data:   Physical Exam: Blood pressure (!) 130/52, pulse 60, height 5' (1.524 m), weight 146 lb 6.4 oz (66.4 kg), SpO2 96 %.  GEN:  elderly frail female,  NAD  HEENT: Normal NECK: No JVD;  bilat carotid bruits ( left > right )  LYMPHATICS: No lymphadenopathy CARDIAC: RRR , soft systolic murmur  RESPIRATORY:  Clear to auscultation without rales, wheezing or rhonchi  ABDOMEN: Soft, non-tender, non-distended MUSCULOSKELETAL:  2-3+  pitting edema, compression hose on ; No deformity  SKIN: Warm and dry NEUROLOGIC:  Alert and oriented x 3     EKG:       Relevant CV Studies:   Laboratory Data:  Chemistry No results for input(s): "NA", "K", "CL", "CO2", "GLUCOSE", "BUN", "CREATININE", "CALCIUM", "GFRNONAA", "GFRAA", "ANIONGAP" in the last 168 hours.   No results for input(s): "PROT", "ALBUMIN", "AST", "ALT", "ALKPHOS", "BILITOT" in the last 168 hours.  Hematology No results for input(s): "WBC", "RBC", "HGB", "HCT", "MCV", "MCH", "MCHC", "RDW", "PLT" in the last 168 hours.  Cardiac Enzymes No results for input(s): "TROPONINI" in the last 168 hours. No results for input(s): "TROPIPOC" in the last 168 hours.  BNPNo results for input(s): "BNP", "PROBNP" in the last 168 hours.  DDimer No results for input(s): "DDIMER" in the last 168 hours.  Radiology/Studies:  No  results found.  Assessment and Plan:   1.   Chronic diastolic CHF :  conti lasix , coreg 6. 25 BID . She overall appears to be fairly stable     2.  Hypertension:    . Cont clonidine, coreg, lasix , cardura    3.   Pulmonary HTN:   multifactorial.  Has chronic diastolic chf,  CKD   4. Leg edema :      5.  CKD :   creatinine is 2.8     Signed, Mertie Moores, MD  12/06/2021 11:58 AM

## 2021-12-06 ENCOUNTER — Encounter: Payer: Self-pay | Admitting: Cardiovascular Disease

## 2021-12-06 ENCOUNTER — Ambulatory Visit: Payer: Medicare Other | Admitting: Cardiovascular Disease

## 2021-12-06 VITALS — BP 130/52 | HR 60 | Ht 60.0 in | Wt 146.4 lb

## 2021-12-06 DIAGNOSIS — R0989 Other specified symptoms and signs involving the circulatory and respiratory systems: Secondary | ICD-10-CM | POA: Diagnosis not present

## 2021-12-06 DIAGNOSIS — N183 Chronic kidney disease, stage 3 unspecified: Secondary | ICD-10-CM | POA: Diagnosis not present

## 2021-12-06 DIAGNOSIS — I272 Pulmonary hypertension, unspecified: Secondary | ICD-10-CM | POA: Diagnosis not present

## 2021-12-06 DIAGNOSIS — I10 Essential (primary) hypertension: Secondary | ICD-10-CM

## 2021-12-06 NOTE — Patient Instructions (Signed)
Medication Instructions:  Your physician recommends that you continue on your current medications as directed. Please refer to the Current Medication list given to you today.  *If you need a refill on your cardiac medications before your next appointment, please call your pharmacy*   Testing/Procedures: Your physician has requested that you have a carotid duplex. This test is an ultrasound of the carotid arteries in your neck. It looks at blood flow through these arteries that supply the brain with blood. Allow one hour for this exam. There are no restrictions or special instructions.    Follow-Up: At [facility], you and your health needs are our priority.  As part of our continuing mission to provide you with exceptional heart care, we have created designated Provider Care Teams.  These Care Teams include your primary Cardiologist (physician) and Advanced Practice Providers (APPs -  Physician Assistants and Nurse Practitioners) who all work together to provide you with the care you need, when you need it.  We recommend signing up for the patient portal called "MyChart".  Sign up information is provided on this After Visit Summary.  MyChart is used to connect with patients for Virtual Visits (Telemedicine).  Patients are able to view lab/test results, encounter notes, upcoming appointments, etc.  Non-urgent messages can be sent to your provider as well.   To learn more about what you can do with MyChart, go to [email or web address].    Your next appointment:   6 month(s)  The format for your next appointment:   In Person  Provider:   Richardson Dopp, PA-C     {   Other Instructions

## 2021-12-16 ENCOUNTER — Other Ambulatory Visit: Payer: Self-pay | Admitting: *Deleted

## 2021-12-16 NOTE — Patient Outreach (Signed)
[facility]) Care Management  12/16/2021  [name] 11/21/1933 [id number]    Care Management   Outreach Note  12/16/2021 Name: [name] MRN: [id number] DOB: Feb 16, 1934  Unsuccessful Outreach #1  Follow Up Plan:  The care management team will reach out to the patient again over the next 7 days.   Raina Mina, RN Care Management Coordinator [place] Office [phone]

## 2021-12-21 ENCOUNTER — Telehealth: Payer: Self-pay | Admitting: Hematology

## 2021-12-21 ENCOUNTER — Ambulatory Visit (HOSPITAL_COMMUNITY)
Admission: RE | Admit: 2021-12-21 | Discharge: 2021-12-21 | Disposition: A | Discharge status: Home / Self Care | Payer: Medicare Other | Source: Ambulatory Visit | Attending: Cardiovascular Disease | Admitting: Cardiovascular Disease

## 2021-12-21 DIAGNOSIS — R0989 Other specified symptoms and signs involving the circulatory and respiratory systems: Secondary | ICD-10-CM

## 2021-12-21 NOTE — Telephone Encounter (Signed)
Unable to leave message with rescheduled upcoming appointment due to provider's PAL. Mailed calendar. 

## 2021-12-22 ENCOUNTER — Ambulatory Visit: Payer: Medicare Other

## 2021-12-27 ENCOUNTER — Telehealth: Payer: Self-pay | Admitting: Internal Medicine

## 2021-12-27 NOTE — Telephone Encounter (Signed)
Left message for patient to call back and schedule Medicare Annual Wellness Visit (AWV) either virtually or in office. Left  my jabber number [phone] ° ° °AWV-I per [place] 05/15/09 °please schedule at anytime with [facility] Nurse Health Advisor 1 or 2 ° ° °This should be a 45 minute visit.  °

## 2021-12-29 ENCOUNTER — Encounter: Payer: Self-pay | Admitting: Cardiovascular Disease

## 2022-01-02 ENCOUNTER — Other Ambulatory Visit: Payer: Medicare Other

## 2022-01-02 ENCOUNTER — Ambulatory Visit: Payer: Medicare Other | Admitting: Hematology

## 2022-01-02 ENCOUNTER — Ambulatory Visit: Payer: Medicare Other

## 2022-01-03 ENCOUNTER — Inpatient Hospital Stay: Payer: Medicare Other | Admitting: Hematology

## 2022-01-03 ENCOUNTER — Inpatient Hospital Stay: Payer: Medicare Other

## 2022-01-03 DIAGNOSIS — D638 Anemia in other chronic diseases classified elsewhere: Secondary | ICD-10-CM

## 2022-01-11 DIAGNOSIS — H2000 Unspecified acute and subacute iridocyclitis: Secondary | ICD-10-CM | POA: Diagnosis not present

## 2022-01-11 DIAGNOSIS — H1131 Conjunctival hemorrhage, right eye: Secondary | ICD-10-CM | POA: Diagnosis not present

## 2022-01-16 NOTE — Progress Notes (Deleted)
[place]   Telephone:(336) [phone] Fax:(336) [phone]   Clinic Follow up Note   Patient Care Team: Panosh, Standley Brooking, MD as PCP - General (Internal Medicine) Nahser, Wonda Cheng, MD as PCP - Cardiology (Cardiology) Marlou Sa, Tonna Corner, MD (Orthopedic Surgery) Truitt Merle, MD as Consulting Physician (Hematology) Madelon Lips, MD as Consulting Physician (Nephrology) Monna Fam, MD as Consulting Physician (Ophthalmology) Viona Gilmore, [facility] as Pharmacist (Pharmacist) Tobi Bastos, RN as [facility] 01/16/2022  CHIEF COMPLAINT: Follow up anemia of chronic disease/CKD  CURRENT THERAPY: Aranesp injections, 132mcg q2weeks, starting 03/24/21, change to q6weeks starting 07/18/21, hold if Hg>=11.0  INTERVAL HISTORY: Ms. Stiner returns for follow up as scheduled. Last seen by Dr. Burr Medico 07/18/21. She continues lab/aranesp q6 weeks.    REVIEW OF SYSTEMS:   Constitutional: Denies fevers, chills or abnormal weight loss Eyes: Denies blurriness of vision Ears, nose, mouth, throat, and face: Denies mucositis or sore throat Respiratory: Denies cough, dyspnea or wheezes Cardiovascular: Denies palpitation, chest discomfort or lower extremity swelling Gastrointestinal:  Denies nausea, heartburn or change in bowel habits Skin: Denies abnormal skin rashes Lymphatics: Denies new lymphadenopathy or easy bruising Neurological:Denies numbness, tingling or new weaknesses Behavioral/Psych: Mood is stable, no new changes  All other systems were reviewed with the patient and are negative.  MEDICAL HISTORY:  Past Medical History:  Diagnosis Date   Acute superficial venous thrombosis of left lower extremity 03/27/2014   concern about hx and potential extensive on exam tenderness calf and low dose lovenox 40 qd 2-4 weekselevetion and close  fu advised .    Allergy    Anemia    Anxiety    Arthritis    "shoulders" (09/09/2012)   Asthma 09/08/2012   Carotid  bruit    [place] 2018  low risk 1 - 39%   Cataract    bil cateracts removed   Chronic kidney disease    Clotting disorder (H[facility])    blood clots in legs   Diabetes mellitus without complication ([place])    "Borderline" per pt; being monitored.  no meds per pt   Diverticulosis    Dyspnea 04/27/2014   GERD (gastroesophageal reflux disease)    Headache(784.0)    "related to my high blood pressure" (09/09/2012)   Hiatal hernia    History of DVT (deep vein thrombosis)    "RLE" (09/09/2012) coumadine cant take asa so on plavix    HTN (hypertension) 09/08/2012   Hyperlipidemia    Lupus ([place])    "cured years ago" (09/09/2012)   Other dysphagia 02/11/2013   Syncope and collapse 01/21/2018   Varicose veins    Varicose veins of leg with complications 24/01/7352    SURGICAL HISTORY: Past Surgical History:  Procedure Laterality Date   ABDOMINAL HYSTERECTOMY     partial   BREAST EXCISIONAL BIOPSY Right    BREAST SURGERY     CARPAL TUNNEL RELEASE Right 2000's   CARPAL TUNNEL RELEASE Bilateral 10/21/2013   Procedure: BILATERAL  CARPAL TUNNEL RELEASE;  Surgeon: [name], MD;  Location: [facility];  Service: Orthopedics;  Laterality: Bilateral;   COLONOSCOPY     SHOULDER OPEN ROTATOR CUFF REPAIR Bilateral 2000's   TRIGGER FINGER RELEASE Right 10/21/2013   Procedure: RELEASE TRIGGER FINGER/A-1 PULLEY RIGHT MIDDLE AND RIGHT RING;  Surgeon: [name], MD;  Location: [facility];  Service: Orthopedics;  Laterality: Right;   UPPER GASTROINTESTINAL ENDOSCOPY      I have reviewed the  social history and family history with the patient and they are unchanged from previous note.  ALLERGIES:  is allergic to penicillins and aspirin.  MEDICATIONS:  Current Outpatient Medications  Medication Sig Dispense Refill   acetaminophen (TYLENOL) 500 MG tablet Take 500 mg by mouth in the morning and at bedtime.     albuterol (PROVENTIL) (2.5 MG/3ML) 0.083% nebulizer solution Take 3  mLs (2.5 mg total) by nebulization every 6 (six) hours as needed for shortness of breath or wheezing. 360 mL 6   albuterol (VENTOLIN HFA) 108 (90 Base) MCG/ACT inhaler USE 2 PUFFS EVERY 6 HOURS AS NEEDED FOR WHEEZING. 8.5 g 3   amLODipine (NORVASC) 5 MG tablet Take 1 tablet (5 mg total) by mouth daily. 90 tablet 1   azaTHIOprine (IMURAN) 50 MG tablet Take 25 mg by mouth at bedtime.     Azelastine-Fluticasone (DYMISTA) 137-50 MCG/ACT SUSP Place 1 spray into the nose in the morning and at bedtime. 23 g 6   B Complex Vitamins (VITAMIN B COMPLEX) TABS Take 1 tablet by mouth daily.     Budeson-Glycopyrrol-Formoterol (BREZTRI AEROSPHERE) 160-9-4.8 MCG/ACT AERO INHALE 2 PUFFS INTO THE LUNGS TWICE DAILY, IN THE MORNING & AT BEDTIME. (Patient taking differently: Inhale 2 puffs into the lungs in the morning and at bedtime.) 10.7 g 11   carvedilol (COREG) 6.25 MG tablet Take 6.25 mg by mouth daily.     cetirizine (ZYRTEC) 10 MG tablet Take 10 mg by mouth daily.     citalopram (CELEXA) 20 MG tablet Take 1 tablet (20 mg total) by mouth every evening. 90 tablet 1   cloNIDine (CATAPRES) 0.1 MG tablet Take 1 tablet every 12 hours as needed for blood pressures greater than 397 systolic (Patient taking differently: Take 0.1 mg by mouth daily.) 15 tablet 0   clopidogrel (PLAVIX) 75 MG tablet TAKE ONE TABLET BY MOUTH ONCE DAILY. 30 tablet 1   diclofenac Sodium (VOLTAREN) 1 % GEL Apply 4 g topically 4 (four) times daily. (Patient taking differently: Apply 4 g topically 2 (two) times daily as needed (painful areas).) 100 g 6   doxazosin (CARDURA) 2 MG tablet Take 3 tablets (6 mg total) by mouth at bedtime. 270 tablet 1   ferrous sulfate 325 (65 FE) MG EC tablet Take 1 tablet (325 mg total) by mouth daily with breakfast. 30 tablet 3   fluticasone (FLONASE) 50 MCG/ACT nasal spray Place 2 sprays into both nostrils daily. (Patient taking differently: Place 1 spray into both nostrils daily as needed for allergies.) 16 g 2    furosemide (LASIX) 40 MG tablet Take 40-80 mg by mouth See admin instructions. 80 mg in the am and 40 mg in the pm     hydrALAZINE (APRESOLINE) 50 MG tablet Take 1.5 tablets (75 mg total) by mouth 3 (three) times daily. 405 tablet 1   LORazepam (ATIVAN) 0.5 MG tablet Take 1 tablet (0.5 mg total) by mouth 2 (two) times daily as needed for anxiety. 30 tablet 0   montelukast (SINGULAIR) 10 MG tablet TAKE ONE TABLET BY MOUTH ONCE DAILY 90 tablet 1   ondansetron (ZOFRAN-ODT) 8 MG disintegrating tablet 8mg  ODT q4 hours prn nausea 20 tablet 0   pantoprazole (PROTONIX) 40 MG tablet Take 1 tablet (40 mg total) by mouth 2 (two) times daily. 180 tablet 1   potassium chloride (KLOR-CON) 10 MEQ tablet TAKE FOUR TABLETS BY MOUTH DAILY. 120 tablet 3   prednisoLONE acetate (PRED FORTE) 1 % ophthalmic suspension Place 1 drop into  the right eye 4 (four) times daily.     RESTASIS 0.05 % ophthalmic emulsion 1 drop 2 (two) times daily.     rosuvastatin (CRESTOR) 20 MG tablet Take 1 tablet (20 mg total) by mouth daily. (Patient taking differently: Take 20 mg by mouth at bedtime.) 90 tablet 3   Spacer/Aero-Holding Chambers (AEROCHAMBER PLUS) inhaler Use as instructed 1 each 2   vitamin B-12 (CYANOCOBALAMIN) 1000 MCG tablet Take 1,000 mcg by mouth daily.     Vitamin D, Cholecalciferol, 25 MCG (1000 UT) TABS Take 1,000 Units by mouth daily.     No current facility-administered medications for this visit.    PHYSICAL EXAMINATION: ECOG PERFORMANCE STATUS: {CHL ONC ECOG PS:[id number]}  There were no vitals filed for this visit. There were no vitals filed for this visit.  GENERAL:alert, no distress and comfortable SKIN: skin color, texture, turgor are normal, no rashes or significant lesions EYES: normal, Conjunctiva are pink and non-injected, sclera clear OROPHARYNX:no exudate, no erythema and lips, buccal mucosa, and tongue normal  NECK: supple, thyroid normal size, non-tender, without nodularity LYMPH:  no  palpable lymphadenopathy in the cervical, axillary or inguinal LUNGS: clear to auscultation and percussion with normal breathing effort HEART: regular rate & rhythm and no murmurs and no lower extremity edema ABDOMEN:abdomen soft, non-tender and normal bowel sounds Musculoskeletal:no cyanosis of digits and no clubbing  NEURO: alert & oriented x 3 with fluent speech, no focal motor/sensory deficits  LABORATORY DATA:  I have reviewed the data as listed    Latest Ref Rng & Units 11/21/2021   11:23 AM 11/16/2021   12:00 AM 10/11/2021   11:26 AM  CBC  WBC 4.0 - 10.5 K/uL 3.6  3.2     4.7   Hemoglobin 12.0 - 15.0 g/dL 10.3  10.8     9.6   Hematocrit 36.0 - 46.0 % 31.4  32     29.9   Platelets 150 - 400 K/uL 164  177     140      This result is from an external source.        Latest Ref Rng & Units 11/22/2021   10:13 AM 11/16/2021   12:00 AM 09/12/2021   12:27 AM  CMP  Glucose 70 - 99 mg/dL   128   BUN 4 - 21  41     35   Creatinine 0.5 - 1.1  2.8     2.34   Sodium 137 - 147  138     138   Potassium 3.5 - 5.1 mEq/L  3.7     4.0   Chloride 99 - 108  103     104   CO2 22 - 32 mmol/L   23   Calcium 8.7 - 10.7  9.3     9.1   Total Protein 6.5 - 8.1 g/dL   6.3   Total Bilirubin 0.3 - 1.2 mg/dL   0.7   Alkaline Phos 38 - 126 U/L   34   AST 15 - 41 U/L   21   ALT 0 - 32 IU/L 5   7      This result is from an external source.      RADIOGRAPHIC STUDIES: I have personally reviewed the radiological images as listed and agreed with the findings in the report. No results found.   ASSESSMENT & PLAN:  No problem-specific Assessment & Plan notes found for this encounter.   No orders of the defined types were  placed in this encounter.  All questions were answered. The patient knows to call the clinic with any problems, questions or concerns. No barriers to learning was detected. I spent {CHL ONC TIME VISIT - [id number]} counseling the patient face to face. The total time spent in the  appointment was {CHL ONC TIME VISIT - [id number]} and more than 50% was on counseling and review of test results     Alla Feeling, NP 01/16/22

## 2022-01-17 ENCOUNTER — Inpatient Hospital Stay: Payer: Medicare Other

## 2022-01-17 ENCOUNTER — Inpatient Hospital Stay: Payer: Medicare Other | Admitting: Nurse Practitioner

## 2022-01-17 NOTE — Progress Notes (Signed)
[place]   [phone] [phone]   Clinic Follow up Note   Patient Care Team: Panosh, Standley Brooking, MD as PCP - General (Internal Medicine) Nahser, Wonda Cheng, MD as PCP - Cardiology (Cardiology) Marlou Sa, Tonna Corner, MD (Orthopedic Surgery) Truitt Merle, MD as Consulting Physician (Hematology) Madelon Lips, MD as Consulting Physician (Nephrology) Monna Fam, MD as Consulting Physician (Ophthalmology) Viona Gilmore, [facility] as Pharmacist (Pharmacist) Tobi Bastos, RN as [place] [facility] Feliz Beam, MD as Referring Physician (Ophthalmology) Collene Gobble, MD as Consulting Physician (Pulmonary Disease) 01/19/2022  CHIEF COMPLAINT: Follow-up anemia of chronic disease  CURRENT THERAPY: Aranesp injections, 157mcg q2weeks, starting 03/24/21, will change to q6weeks starting 07/18/21, hold if Hg>=11.0  *will change to q4 weeks from 01/19/22 due to worsening anemia  INTERVAL HISTORY: [name] returns for follow-up as scheduled, here with her daughter.  Last seen by Dr. Burr Medico 07/18/2021 and Aranesp changed to q6 weeks (last given 11/21/2021).  She was hospitalized earlier this year for GI illness.  She continues follow-up with cardiology, PCP, and care team.  She denies changes in her health, feeling well in general except left sciatic pain flare.  Energy is adequate.  Denies bleeding, cough, chest pain, dyspnea.  Leg edema slightly worse, wearing compression stockings.  All other systems were reviewed with the patient and are negative.  MEDICAL HISTORY:  Past Medical History:  Diagnosis Date   Acute superficial venous thrombosis of left lower extremity 03/27/2014   concern about hx and potential extensive on exam tenderness calf and low dose lovenox 40 qd 2-4 weekselevetion and close  fu advised .    Allergy    Anemia    Anxiety    Arthritis    "shoulders" (09/09/2012)   Asthma 09/08/2012   Carotid bruit    [place] 2018  low  risk 1 - 39%   Cataract    bil cateracts removed   Chronic kidney disease    Clotting disorder (HCC)    blood clots in legs   Diabetes mellitus without complication ([place])    "Borderline" per pt; being monitored.  no meds per pt   Diverticulosis    Dyspnea 04/27/2014   GERD (gastroesophageal reflux disease)    Headache(784.0)    "related to my high blood pressure" (09/09/2012)   Hiatal hernia    History of DVT (deep vein thrombosis)    "RLE" (09/09/2012) coumadine cant take asa so on plavix    HTN (hypertension) 09/08/2012   Hyperlipidemia    Lupus ([place])    "cured years ago" (09/09/2012)   Other dysphagia 02/11/2013   Syncope and collapse 01/21/2018   Varicose veins    Varicose veins of leg with complications 16/96/7893    SURGICAL HISTORY: Past Surgical History:  Procedure Laterality Date   ABDOMINAL HYSTERECTOMY     partial   BREAST EXCISIONAL BIOPSY Right    BREAST SURGERY     CARPAL TUNNEL RELEASE Right 2000's   CARPAL TUNNEL RELEASE Bilateral 10/21/2013   Procedure: BILATERAL  CARPAL TUNNEL RELEASE;  Surgeon: [name], MD;  Location: [facility];  Service: Orthopedics;  Laterality: Bilateral;   COLONOSCOPY     SHOULDER OPEN ROTATOR CUFF REPAIR Bilateral 2000's   TRIGGER FINGER RELEASE Right 10/21/2013   Procedure: RELEASE TRIGGER FINGER/A-1 PULLEY RIGHT MIDDLE AND RIGHT RING;  Surgeon: [name], MD;  Location: [facility];  Service: Orthopedics;  Laterality: Right;   UPPER GASTROINTESTINAL  ENDOSCOPY      I have reviewed the social history and family history with the patient and they are unchanged from previous note.  ALLERGIES:  is allergic to penicillins and aspirin.  MEDICATIONS:  Current Outpatient Medications  Medication Sig Dispense Refill   acetaminophen (TYLENOL) 500 MG tablet Take 500 mg by mouth in the morning and at bedtime.     albuterol (PROVENTIL) (2.5 MG/3ML) 0.083% nebulizer solution Take 3 mLs (2.5 mg total) by  nebulization every 6 (six) hours as needed for shortness of breath or wheezing. 360 mL 6   albuterol (VENTOLIN HFA) 108 (90 Base) MCG/ACT inhaler USE 2 PUFFS EVERY 6 HOURS AS NEEDED FOR WHEEZING. 8.5 g 3   amLODipine (NORVASC) 5 MG tablet Take 1 tablet (5 mg total) by mouth daily. 90 tablet 1   azaTHIOprine (IMURAN) 50 MG tablet Take 25 mg by mouth at bedtime.     Azelastine-Fluticasone (DYMISTA) 137-50 MCG/ACT SUSP Place 1 spray into the nose in the morning and at bedtime. 23 g 6   B Complex Vitamins (VITAMIN B COMPLEX) TABS Take 1 tablet by mouth daily.     Budeson-Glycopyrrol-Formoterol (BREZTRI AEROSPHERE) 160-9-4.8 MCG/ACT AERO INHALE 2 PUFFS INTO THE LUNGS TWICE DAILY, IN THE MORNING & AT BEDTIME. (Patient taking differently: Inhale 2 puffs into the lungs in the morning and at bedtime.) 10.7 g 11   carvedilol (COREG) 6.25 MG tablet Take 6.25 mg by mouth daily.     cetirizine (ZYRTEC) 10 MG tablet Take 10 mg by mouth daily.     citalopram (CELEXA) 20 MG tablet Take 1 tablet (20 mg total) by mouth every evening. 90 tablet 1   cloNIDine (CATAPRES) 0.1 MG tablet Take 1 tablet every 12 hours as needed for blood pressures greater than 270 systolic (Patient taking differently: Take 0.1 mg by mouth daily.) 15 tablet 0   clopidogrel (PLAVIX) 75 MG tablet TAKE ONE TABLET BY MOUTH ONCE DAILY. 30 tablet 1   diclofenac Sodium (VOLTAREN) 1 % GEL Apply 4 g topically 4 (four) times daily. (Patient taking differently: Apply 4 g topically 2 (two) times daily as needed (painful areas).) 100 g 6   doxazosin (CARDURA) 2 MG tablet Take 3 tablets (6 mg total) by mouth at bedtime. 270 tablet 1   ferrous sulfate 325 (65 FE) MG EC tablet Take 1 tablet (325 mg total) by mouth daily with breakfast. 30 tablet 3   fluticasone (FLONASE) 50 MCG/ACT nasal spray Place 2 sprays into both nostrils daily. (Patient taking differently: Place 1 spray into both nostrils daily as needed for allergies.) 16 g 2   furosemide (LASIX) 40 MG  tablet Take 40-80 mg by mouth See admin instructions. 80 mg in the am and 40 mg in the pm     hydrALAZINE (APRESOLINE) 50 MG tablet Take 1.5 tablets (75 mg total) by mouth 3 (three) times daily. 405 tablet 1   LORazepam (ATIVAN) 0.5 MG tablet Take 1 tablet (0.5 mg total) by mouth 2 (two) times daily as needed for anxiety. 30 tablet 0   montelukast (SINGULAIR) 10 MG tablet TAKE ONE TABLET BY MOUTH ONCE DAILY 90 tablet 1   ondansetron (ZOFRAN-ODT) 8 MG disintegrating tablet 8mg  ODT q4 hours prn nausea 20 tablet 0   pantoprazole (PROTONIX) 40 MG tablet Take 1 tablet (40 mg total) by mouth 2 (two) times daily. 180 tablet 1   potassium chloride (KLOR-CON) 10 MEQ tablet TAKE FOUR TABLETS BY MOUTH DAILY. 120 tablet 3   prednisoLONE acetate (  PRED FORTE) 1 % ophthalmic suspension Place 1 drop into the right eye 4 (four) times daily.     RESTASIS 0.05 % ophthalmic emulsion 1 drop 2 (two) times daily.     rosuvastatin (CRESTOR) 20 MG tablet Take 1 tablet (20 mg total) by mouth daily. (Patient taking differently: Take 20 mg by mouth at bedtime.) 90 tablet 3   Spacer/Aero-Holding Chambers (AEROCHAMBER PLUS) inhaler Use as instructed 1 each 2   vitamin B-12 (CYANOCOBALAMIN) 1000 MCG tablet Take 1,000 mcg by mouth daily.     Vitamin D, Cholecalciferol, 25 MCG (1000 UT) TABS Take 1,000 Units by mouth daily.     No current facility-administered medications for this visit.    PHYSICAL EXAMINATION: ECOG PERFORMANCE STATUS: 1 - Symptomatic but completely ambulatory  Vitals:   01/19/22 1138  BP: (!) 173/58  Pulse: 64  Resp: 15  Temp: 98.2 F (36.8 C)  SpO2: 99%   Filed Weights   01/19/22 1138  Weight: 147 lb (66.7 kg)    GENERAL:alert, no distress and comfortable SKIN: no rash  EYES: sclera clear LUNGS: clear with normal breathing effort HEART: regular rate & rhythm, moderate bilateral lower extremity edema NEURO: alert & oriented x 3 with fluent speech, no focal motor/sensory  deficits  LABORATORY DATA:  I have reviewed the data as listed    Latest Ref Rng & Units 01/19/2022   11:15 AM 11/21/2021   11:23 AM 11/16/2021   12:00 AM  CBC  WBC 4.0 - 10.5 K/uL 3.3  3.6  3.2      Hemoglobin 12.0 - 15.0 g/dL 8.9  10.3  10.8      Hematocrit 36.0 - 46.0 % 28.4  31.4  32      Platelets 150 - 400 K/uL 161  164  177         This result is from an external source.        Latest Ref Rng & Units 11/22/2021   10:13 AM 11/16/2021   12:00 AM 09/12/2021   12:27 AM  CMP  Glucose 70 - 99 mg/dL   128   BUN 4 - 21  41     35   Creatinine 0.5 - 1.1  2.8     2.34   Sodium 137 - 147  138     138   Potassium 3.5 - 5.1 mEq/L  3.7     4.0   Chloride 99 - 108  103     104   CO2 22 - 32 mmol/L   23   Calcium 8.7 - 10.7  9.3     9.1   Total Protein 6.5 - 8.1 g/dL   6.3   Total Bilirubin 0.3 - 1.2 mg/dL   0.7   Alkaline Phos 38 - 126 U/L   34   AST 15 - 41 U/L   21   ALT 0 - 32 IU/L 5   7      This result is from an external source.      RADIOGRAPHIC STUDIES: I have personally reviewed the radiological images as listed and agreed with the findings in the report. No results found.   ASSESSMENT & PLAN: [name] is a 88 y.o. female with    1. Anemia of chronic disease secondary to CKD.  -Previously seen 06/2016, at which time her hgb was up to 10.4 and her ferritin and iron levels were normal. Since then her hemoglobin trended down to 9 range. Repeated  iron studies were all normal. No high suspicion for MDS  -she is taking oral iron and vit B12. -Her anemia is likely secondary to CKD  -She began Aranesp injections 03/24/2021, initially q2 weeks, changed to q6 weeks in 07/2021 due to good response  -[name] appears stable.  She responded well to Aranesp and injections changed to q6 weeks with stable hemoglobin until today, Hgb down to 8.9.  -I recommend to increase injections to monthly for now, patient agrees  -Lab monthly with injections, hold for hgb >  100 -Follow-up in 4 months or sooner if needed   2. Chronic Renal Disease, stage IV -SCr > 2 in the past year   3. Hypertension, pulmonary HTN, chronic diastolic CHF, history of DVT -Continue low salt diet and anti-hypertensive medications per PCP and cardiology Dr. Acie Fredrickson.     PLAN: -Labs reviewed, Hgb 8.9 -Proceed with Aranesp today and change to q4 weeks -Lab monthly with inj, hold for hgb >11 -F/up in 4 months   All questions were answered. The patient knows to call the clinic with any problems, questions or concerns. No barriers to learning were detected.     Alla Feeling, NP 01/19/22

## 2022-01-18 ENCOUNTER — Other Ambulatory Visit: Payer: Self-pay | Admitting: Emergency Medicine

## 2022-01-18 ENCOUNTER — Other Ambulatory Visit: Payer: Self-pay

## 2022-01-18 DIAGNOSIS — D638 Anemia in other chronic diseases classified elsewhere: Secondary | ICD-10-CM

## 2022-01-19 ENCOUNTER — Inpatient Hospital Stay: Payer: Medicare Other

## 2022-01-19 ENCOUNTER — Inpatient Hospital Stay: Payer: Medicare Other | Attending: Hematology

## 2022-01-19 ENCOUNTER — Inpatient Hospital Stay (HOSPITAL_BASED_OUTPATIENT_CLINIC_OR_DEPARTMENT_OTHER): Payer: Medicare Other | Admitting: Nurse Practitioner

## 2022-01-19 ENCOUNTER — Other Ambulatory Visit: Payer: Self-pay

## 2022-01-19 ENCOUNTER — Encounter: Payer: Self-pay | Admitting: Nurse Practitioner

## 2022-01-19 VITALS — BP 146/50

## 2022-01-19 VITALS — BP 173/58 | HR 64 | Temp 98.2°F | Resp 15 | Ht 60.0 in | Wt 147.0 lb

## 2022-01-19 DIAGNOSIS — D638 Anemia in other chronic diseases classified elsewhere: Secondary | ICD-10-CM

## 2022-01-19 DIAGNOSIS — I5032 Chronic diastolic (congestive) heart failure: Secondary | ICD-10-CM | POA: Insufficient documentation

## 2022-01-19 DIAGNOSIS — J45909 Unspecified asthma, uncomplicated: Secondary | ICD-10-CM | POA: Diagnosis not present

## 2022-01-19 DIAGNOSIS — Z86718 Personal history of other venous thrombosis and embolism: Secondary | ICD-10-CM | POA: Diagnosis not present

## 2022-01-19 DIAGNOSIS — Z886 Allergy status to analgesic agent status: Secondary | ICD-10-CM | POA: Insufficient documentation

## 2022-01-19 DIAGNOSIS — K219 Gastro-esophageal reflux disease without esophagitis: Secondary | ICD-10-CM | POA: Insufficient documentation

## 2022-01-19 DIAGNOSIS — Z880 Allergy status to penicillin: Secondary | ICD-10-CM | POA: Diagnosis not present

## 2022-01-19 DIAGNOSIS — R600 Localized edema: Secondary | ICD-10-CM | POA: Diagnosis not present

## 2022-01-19 DIAGNOSIS — N183 Chronic kidney disease, stage 3 unspecified: Secondary | ICD-10-CM | POA: Insufficient documentation

## 2022-01-19 DIAGNOSIS — Z7902 Long term (current) use of antithrombotics/antiplatelets: Secondary | ICD-10-CM | POA: Diagnosis not present

## 2022-01-19 DIAGNOSIS — F419 Anxiety disorder, unspecified: Secondary | ICD-10-CM | POA: Diagnosis not present

## 2022-01-19 DIAGNOSIS — Z79899 Other long term (current) drug therapy: Secondary | ICD-10-CM | POA: Diagnosis not present

## 2022-01-19 DIAGNOSIS — I272 Pulmonary hypertension, unspecified: Secondary | ICD-10-CM | POA: Diagnosis not present

## 2022-01-19 DIAGNOSIS — D631 Anemia in chronic kidney disease: Secondary | ICD-10-CM | POA: Insufficient documentation

## 2022-01-19 DIAGNOSIS — I130 Hypertensive heart and chronic kidney disease with heart failure and stage 1 through stage 4 chronic kidney disease, or unspecified chronic kidney disease: Secondary | ICD-10-CM | POA: Diagnosis not present

## 2022-01-19 LAB — CBC WITH DIFFERENTIAL/PLATELET
Abs Immature Granulocytes: 0.01 10*3/uL (ref 0.00–0.07)
Basophils Absolute: 0 10*3/uL (ref 0.0–0.1)
Basophils Relative: 1 %
Eosinophils Absolute: 0.1 10*3/uL (ref 0.0–0.5)
Eosinophils Relative: 3 %
HCT: 28.4 % — ABNORMAL LOW (ref 36.0–46.0)
Hemoglobin: 8.9 g/dL — ABNORMAL LOW (ref 12.0–15.0)
Immature Granulocytes: 0 %
Lymphocytes Relative: 37 %
Lymphs Abs: 1.2 10*3/uL (ref 0.7–4.0)
MCH: 28.2 pg (ref 26.0–34.0)
MCHC: 31.3 g/dL (ref 30.0–36.0)
MCV: 89.9 fL (ref 80.0–100.0)
Monocytes Absolute: 0.3 10*3/uL (ref 0.1–1.0)
Monocytes Relative: 9 %
Neutro Abs: 1.7 10*3/uL (ref 1.7–7.7)
Neutrophils Relative %: 50 %
Platelets: 161 10*3/uL (ref 150–400)
RBC: 3.16 MIL/uL — ABNORMAL LOW (ref 3.87–5.11)
RDW: 14 % (ref 11.5–15.5)
WBC: 3.3 10*3/uL — ABNORMAL LOW (ref 4.0–10.5)
nRBC: 0 % (ref 0.0–0.2)

## 2022-01-19 LAB — FERRITIN: Ferritin: 139 ng/mL (ref 11–307)

## 2022-01-19 MED ORDER — DARBEPOETIN ALFA 100 MCG/0.5ML IJ SOSY
100.0000 ug | PREFILLED_SYRINGE | Freq: Once | INTRAMUSCULAR | Status: AC
  Administered 2022-01-19: 100 ug via SUBCUTANEOUS
  Filled 2022-01-19: qty 0.5

## 2022-01-19 NOTE — Patient Instructions (Signed)

## 2022-01-24 ENCOUNTER — Telehealth: Payer: Medicare Other

## 2022-01-25 ENCOUNTER — Ambulatory Visit (INDEPENDENT_AMBULATORY_CARE_PROVIDER_SITE_OTHER): Payer: Medicare Other | Admitting: Internal Medicine

## 2022-01-25 ENCOUNTER — Encounter: Payer: Self-pay | Admitting: Internal Medicine

## 2022-01-25 VITALS — BP 170/64 | HR 54 | Temp 98.0°F | Wt 144.4 lb

## 2022-01-25 DIAGNOSIS — F411 Generalized anxiety disorder: Secondary | ICD-10-CM

## 2022-01-25 DIAGNOSIS — I10 Essential (primary) hypertension: Secondary | ICD-10-CM | POA: Diagnosis not present

## 2022-01-25 DIAGNOSIS — I130 Hypertensive heart and chronic kidney disease with heart failure and stage 1 through stage 4 chronic kidney disease, or unspecified chronic kidney disease: Secondary | ICD-10-CM

## 2022-01-25 DIAGNOSIS — Z23 Encounter for immunization: Secondary | ICD-10-CM

## 2022-01-25 DIAGNOSIS — M545 Low back pain, unspecified: Secondary | ICD-10-CM

## 2022-01-25 DIAGNOSIS — R7301 Impaired fasting glucose: Secondary | ICD-10-CM

## 2022-01-25 DIAGNOSIS — N183 Chronic kidney disease, stage 3 unspecified: Secondary | ICD-10-CM | POA: Diagnosis not present

## 2022-01-25 DIAGNOSIS — D638 Anemia in other chronic diseases classified elsewhere: Secondary | ICD-10-CM | POA: Diagnosis not present

## 2022-01-25 DIAGNOSIS — I1A0 Resistant hypertension: Secondary | ICD-10-CM

## 2022-01-25 DIAGNOSIS — Z79899 Other long term (current) drug therapy: Secondary | ICD-10-CM

## 2022-01-25 LAB — POCT GLYCOSYLATED HEMOGLOBIN (HGB A1C): Hemoglobin A1C: 5.4 % (ref 4.0–5.6)

## 2022-01-25 MED ORDER — LORAZEPAM 0.5 MG PO TABS
0.5000 mg | ORAL_TABLET | Freq: Two times a day (BID) | ORAL | 0 refills | Status: DC | PRN
Start: 2022-01-25 — End: 2022-05-18

## 2022-01-25 NOTE — Progress Notes (Signed)
Chief Complaint  Patient presents with   Follow-up   Hip Pain    Pt c/o of left hip pain. Thinks it is from arthritis .     HPI: [name] 88 y.o. come in for Chronic disease management  4 mos check  Left hip back hurting  hx of sciatica  has seen dr Deans office in past  . No fall or weakness  . [facility]?  Given as needed clonidine Has multiple specialties  PCP: Lorazepam only ocass use ,  citalopram   Singulair Plavix , potassium  Protonix   taking  for heart burn  reflux   Pulm Dr Lamonte Sakai inhalers  stable  no flares  Cards Dr Acie Fredrickson ha adjusted  bp meds  hx of low and then high bp  had carotid  US done because of bruits and being followed.  Renal: Sees Dr Hollie Salk  last cr 2.8?  Potassium 3.7  Heme:  for   was 8.9 last week  to inc injec arescept  goal 11   ROS: See pertinent positives and negatives per HPI.  Past Medical History:  Diagnosis Date   Acute superficial venous thrombosis of left lower extremity 03/27/2014   concern about hx and potential extensive on exam tenderness calf and low dose lovenox 40 qd 2-4 weekselevetion and close  fu advised .    Allergy    Anemia    Anxiety    Arthritis    "shoulders" (09/09/2012)   Asthma 09/08/2012   Carotid bruit    [place] 2018  low risk 1 - 39%   Cataract    bil cateracts removed   Chronic kidney disease    Clotting disorder (HCC)    blood clots in legs   Diabetes mellitus without complication ([place])    "Borderline" per pt; being monitored.  no meds per pt   Diverticulosis    Dyspnea 04/27/2014   GERD (gastroesophageal reflux disease)    Headache(784.0)    "related to my high blood pressure" (09/09/2012)   Hiatal hernia    History of DVT (deep vein thrombosis)    "RLE" (09/09/2012) coumadine cant take asa so on plavix    HTN (hypertension) 09/08/2012   Hyperlipidemia    Lupus ([place])    "cured years ago" (09/09/2012)   Other dysphagia 02/11/2013   Syncope and collapse 01/21/2018    Varicose veins    Varicose veins of leg with complications 81/27/5170    Family History  Problem Relation Age of Onset   Hypertension Mother    Lung cancer Mother    Hypertension Father    Lung cancer Father    Breast cancer Sister    Colon cancer Sister        ? 51' s dx - died in 28's   Breast cancer Sister    Hypertension Brother    Rectal cancer Brother    Stomach cancer Brother    Breast cancer Brother    Heart attack Daughter    Esophageal cancer Daughter    Hypertension Daughter    Diabetes Daughter    Breast cancer Paternal Aunt    Lung cancer Other        both parents    Pancreatic cancer Neg Hx     Social History   Socioeconomic History   Marital status: Single    Spouse name: Not on file   Number of children: 6   Years of  education: Not on file   Highest education level: Not on file  Occupational History   Occupation: retired  Tobacco Use   Smoking status: Never   Smokeless tobacco: Never  Vaping Use   Vaping Use: Never used  Substance and Sexual Activity   Alcohol use: No   Drug use: No   Sexual activity: Not Currently    Birth control/protection: Surgical  Other Topics Concern   Not on file  Social History Narrative   2 people living in the home.  Grand daughter.   Up and down through the night      Had 7 children  2 deceased . 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    works 3 days per week currently .   Glasses dentures  Neg tad    Social Determinants of Health   Financial Resource Strain: Low Risk  (01/01/2020)   Overall Financial Resource Strain (CARDIA)    Difficulty of Paying Living Expenses: Not hard at all  Food Insecurity: Not on file  Transportation Needs: No Transportation Needs (01/01/2020)   PRAPARE - Hydrologist (Medical): No    Lack of Transportation (Non-Medical): No  Physical Activity: Not on file  Stress: Not on  file  Social Connections: Not on file    Outpatient Medications Prior to Visit  Medication Sig Dispense Refill   acetaminophen (TYLENOL) 500 MG tablet Take 500 mg by mouth in the morning and at bedtime.     albuterol (PROVENTIL) (2.5 MG/3ML) 0.083% nebulizer solution Take 3 mLs (2.5 mg total) by nebulization every 6 (six) hours as needed for shortness of breath or wheezing. 360 mL 6   albuterol (VENTOLIN HFA) 108 (90 Base) MCG/ACT inhaler USE 2 PUFFS EVERY 6 HOURS AS NEEDED FOR WHEEZING. 8.5 g 3   amLODipine (NORVASC) 5 MG tablet Take 1 tablet (5 mg total) by mouth daily. 90 tablet 1   azaTHIOprine (IMURAN) 50 MG tablet Take 25 mg by mouth at bedtime.     Azelastine-Fluticasone 137-50 MCG/ACT SUSP PLACE ONE SPRAY IN EACH NOSTRIL TWICE DAILY (IN THE MORNING AND AT BEDTIME) 23 g 6   Budeson-Glycopyrrol-Formoterol (BREZTRI AEROSPHERE) 160-9-4.8 MCG/ACT AERO INHALE 2 PUFFS INTO THE LUNGS TWICE DAILY, IN THE MORNING & AT BEDTIME. (Patient taking differently: Inhale 2 puffs into the lungs in the morning and at bedtime.) 10.7 g 11   carvedilol (COREG) 6.25 MG tablet Take 6.25 mg by mouth daily.     cetirizine (ZYRTEC) 10 MG tablet Take 10 mg by mouth daily.     citalopram (CELEXA) 20 MG tablet Take 1 tablet (20 mg total) by mouth every evening. 90 tablet 1   cloNIDine (CATAPRES) 0.1 MG tablet Take 1 tablet every 12 hours as needed for blood pressures greater than 378 systolic (Patient taking differently: Take 0.1 mg by mouth daily.) 15 tablet 0   clopidogrel (PLAVIX) 75 MG tablet TAKE ONE TABLET BY MOUTH ONCE DAILY. 30 tablet 1   diclofenac Sodium (VOLTAREN) 1 % GEL Apply 4 g topically 4 (four) times daily. (Patient taking differently: Apply 4 g topically 2 (two) times daily as needed (painful areas).) 100 g 6   doxazosin (CARDURA) 2 MG tablet Take 3 tablets (6 mg total) by mouth at bedtime. 270 tablet 1   ferrous sulfate 325 (65 FE) MG EC tablet Take 1 tablet (325 mg total) by mouth daily  with  breakfast. 30 tablet 3   furosemide (LASIX) 40 MG tablet Take 40-80 mg by mouth See admin instructions. 80 mg in the am and 40 mg in the pm     hydrALAZINE (APRESOLINE) 50 MG tablet Take 1.5 tablets (75 mg total) by mouth 3 (three) times daily. 405 tablet 1   montelukast (SINGULAIR) 10 MG tablet TAKE ONE TABLET BY MOUTH ONCE DAILY 90 tablet 1   pantoprazole (PROTONIX) 40 MG tablet Take 1 tablet (40 mg total) by mouth 2 (two) times daily. 180 tablet 1   potassium chloride (KLOR-CON) 10 MEQ tablet TAKE FOUR TABLETS BY MOUTH DAILY. 120 tablet 3   prednisoLONE acetate (PRED FORTE) 1 % ophthalmic suspension Place 1 drop into the right eye 4 (four) times daily.     RESTASIS 0.05 % ophthalmic emulsion 1 drop 2 (two) times daily.     rosuvastatin (CRESTOR) 20 MG tablet Take 1 tablet (20 mg total) by mouth daily. (Patient taking differently: Take 20 mg by mouth at bedtime.) 90 tablet 3   Spacer/Aero-Holding Chambers (AEROCHAMBER PLUS) inhaler Use as instructed 1 each 2   vitamin B-12 (CYANOCOBALAMIN) 1000 MCG tablet Take 1,000 mcg by mouth daily.     Vitamin D, Cholecalciferol, 25 MCG (1000 UT) TABS Take 1,000 Units by mouth daily.     LORazepam (ATIVAN) 0.5 MG tablet Take 1 tablet (0.5 mg total) by mouth 2 (two) times daily as needed for anxiety. 30 tablet 0   fluticasone (FLONASE) 50 MCG/ACT nasal spray Place 2 sprays into both nostrils daily. (Patient not taking: Reported on 01/25/2022) 16 g 2   ondansetron (ZOFRAN-ODT) 8 MG disintegrating tablet 8mg  ODT q4 hours prn nausea (Patient not taking: Reported on 01/25/2022) 20 tablet 0   B Complex Vitamins (VITAMIN B COMPLEX) TABS Take 1 tablet by mouth daily. (Patient not taking: Reported on 01/25/2022)     No facility-administered medications prior to visit.     EXAM:  BP (!) 170/64 (BP Location: Left Arm)   Pulse (!) 54   Temp 98 F (36.7 C) (Oral)   Wt 144 lb 6.4 oz (65.5 kg)   SpO2 98%   BMI 28.20 kg/m   Body mass index is 28.2  kg/m.  GENERAL: vitals reviewed and listed above, alert, oriented, appears well hydrated and in no acute distress HEENT: atraumatic, conjunctiva  clear, no obvious abnormalities on inspection of external nose and ears masked  NECK: no obvious masses on inspection palpation  LUNGS: clear to auscultation bilaterally, no wheezes, rales or rhonchi, good air movement CV: HRRR, no clubbing cyanosis or compression stockings nl cap refill  Area of discomfort llback si area  to left iliac area  independent gait  MS: moves all extremities without noticeable acute focal  abnormality PSYCH: pleasant and cooperative, no obvious depression or anxiety nl speech  memory  Lab Results  Component Value Date   WBC 3.3 (L) 01/19/2022   HGB 8.9 (L) 01/19/2022   HCT 28.4 (L) 01/19/2022   PLT 161 01/19/2022   GLUCOSE 128 (H) 09/12/2021   CHOL 174 11/22/2021   TRIG 87 11/22/2021   HDL 79 11/22/2021   LDLCALC 79 11/22/2021   ALT 5 11/22/2021   AST 21 09/12/2021   NA 138 11/16/2021   K 3.7 11/16/2021   CL 103 11/16/2021   CREATININE 2.8 (A) 11/16/2021   BUN 41 (A) 11/16/2021   CO2 23 09/12/2021   TSH 1.60 06/29/2021   INR 0.94 12/22/2009   HGBA1C 5.4  01/25/2022   MICROALBUR 119 03/02/2021   BP Readings from Last 3 Encounters:  01/25/22 (!) 170/64  01/19/22 (!) 146/50  01/19/22 (!) 173/58  See carotid studies less than 50 %  right   ASSESSMENT AND PLAN:  Discussed the following assessment and plan:  Medication management  Anxiety state - stable rare [place] lorazepam  Left low back pain, unspecified chronicity, unspecified whether sciatica present  Stage 3 chronic kidney disease, unspecified whether stage 3a or 3b CKD (HCC)  Fasting hyperglycemia - a1c better  5.4  - Plan: POC HgB A1c  Anemia, chronic disease - hg down to 8 range  rstrted aranesp  Influenza vaccine needed - Plan: Flu Vaccine QUAD High Dose(Fluad)  Resistant hypertension  Hypertensive heart disease with congestive heart  failure and chronic kidney disease, unspecified CKD stage, unspecified heart failure type ([place])  -Patient advised to return or notify health care team  if  new concerns arise.in interim  Patient Instructions  Will refill the lorazepam to use as needed. The pain seems more to be back  ( or hip) try the local patches  and if  persistent or progressive see Dr Forbes Cellar office  next step.  Bp up today  see if can get some readings and will send info to dr Kristopher Oppenheim  to see if wants to adjust med dosing again. .   Get a  monitor   bp to get some readings   Advise getting  RSV vaccine in October  to decreases risk forgetting RSV infection. Blood sugar .  Hg a1c is 5.4 today  better   now normal        Mariann Laster K. Jen Eppinger M.D.

## 2022-01-25 NOTE — Patient Instructions (Addendum)
Will refill the lorazepam to use as needed. The pain seems more to be back  ( or hip) try the local patches  and if  persistent or progressive see Dr Forbes Cellar office  next step.  Bp up today  see if can get some readings and will send info to dr Kristopher Oppenheim  to see if wants to adjust med dosing again. .   Get a  monitor   bp to get some readings   Advise getting  RSV vaccine in October  to decreases risk forgetting RSV infection. Blood sugar .  Hg a1c is 5.4 today  better   now normal

## 2022-01-25 NOTE — Assessment & Plan Note (Signed)
Readings up today could be from pain has been adjusted recently We will send information to Dr. Acie Fredrickson on often. Advise she get a updated monitor at home.  Send in some readings.

## 2022-01-26 ENCOUNTER — Other Ambulatory Visit: Payer: Self-pay

## 2022-01-26 ENCOUNTER — Telehealth: Payer: Self-pay | Admitting: Hematology

## 2022-01-26 MED ORDER — BREZTRI AEROSPHERE 160-9-4.8 MCG/ACT IN AERO: INHALATION_SPRAY | RESPIRATORY_TRACT | 11 refills | Status: DC

## 2022-01-26 NOTE — Telephone Encounter (Signed)
Left message with follow-up appointments per 9/7 los. 

## 2022-01-27 ENCOUNTER — Telehealth: Payer: Self-pay

## 2022-01-27 NOTE — Telephone Encounter (Signed)
-----   Message from Thayer Headings, MD sent at 01/26/2022  7:03 PM EDT ----- Her BP was normal when I saw her several months ago  Will get her in for an office visit in the next several weeks  to reassess  Phil    ----- Message ----- From: Burnis Medin, MD Sent: 01/25/2022   6:37 PM EDT To: Madelon Lips, MD; Thayer Headings, MD; #  ? Advice to patient about antihypertensive dosing, thanks

## 2022-01-27 NOTE — Telephone Encounter (Signed)
Left detailed message for patient asking that she call our office or send a MyChart message so that we can schedule an appointment to reassess her BP and titrate meds if needed. Awaiting call back.

## 2022-01-30 ENCOUNTER — Telehealth: Payer: Self-pay | Admitting: Internal Medicine

## 2022-01-30 NOTE — Telephone Encounter (Signed)
Left message for patient to call back and schedule Medicare Annual Wellness Visit (AWV) either virtually or in office. Left  my jabber number [phone] ° ° °AWV-I per [place] 05/15/09 °please schedule at anytime with [facility] Nurse Health Advisor 1 or 2 ° ° °This should be a 45 minute visit.  °

## 2022-02-01 ENCOUNTER — Other Ambulatory Visit: Payer: Self-pay | Admitting: Internal Medicine

## 2022-02-07 DIAGNOSIS — H35033 Hypertensive retinopathy, bilateral: Secondary | ICD-10-CM | POA: Diagnosis not present

## 2022-02-07 DIAGNOSIS — H44113 Panuveitis, bilateral: Secondary | ICD-10-CM | POA: Diagnosis not present

## 2022-02-07 DIAGNOSIS — Z79899 Other long term (current) drug therapy: Secondary | ICD-10-CM | POA: Diagnosis not present

## 2022-02-07 DIAGNOSIS — H3581 Retinal edema: Secondary | ICD-10-CM | POA: Diagnosis not present

## 2022-02-15 ENCOUNTER — Ambulatory Visit
Admission: RE | Admit: 2022-02-15 | Discharge: 2022-02-15 | Disposition: A | Discharge status: Home / Self Care | Payer: Medicare Other | Source: Ambulatory Visit | Attending: Internal Medicine | Admitting: Internal Medicine

## 2022-02-15 ENCOUNTER — Telehealth: Payer: Self-pay | Admitting: *Deleted

## 2022-02-15 DIAGNOSIS — Z1231 Encounter for screening mammogram for malignant neoplasm of breast: Secondary | ICD-10-CM | POA: Diagnosis not present

## 2022-02-15 NOTE — Patient Outreach (Signed)
  Care Coordination   02/15/2022 Name: [name] MRN: [id number] DOB: April 03, 1934   Care Coordination Outreach Attempts:  A second unsuccessful outreach was attempted today to offer the patient with information about available care coordination services as a benefit of their health plan.     Follow Up Plan:  Additional outreach attempts will be made to offer the patient care coordination information and services.   Encounter Outcome:  No Answer  Care Coordination Interventions Activated:  No   Care Coordination Interventions:  No, not indicated    Raina Mina, RN Care Management Coordinator [place] Office [phone]

## 2022-02-16 ENCOUNTER — Other Ambulatory Visit: Payer: Self-pay | Admitting: Internal Medicine

## 2022-02-20 ENCOUNTER — Other Ambulatory Visit: Payer: Self-pay

## 2022-02-20 ENCOUNTER — Inpatient Hospital Stay: Payer: Medicare Other

## 2022-02-20 ENCOUNTER — Inpatient Hospital Stay: Payer: Medicare Other | Attending: Hematology

## 2022-02-20 VITALS — BP 168/50 | HR 62 | Temp 98.6°F | Resp 16

## 2022-02-20 DIAGNOSIS — I129 Hypertensive chronic kidney disease with stage 1 through stage 4 chronic kidney disease, or unspecified chronic kidney disease: Secondary | ICD-10-CM | POA: Insufficient documentation

## 2022-02-20 DIAGNOSIS — D638 Anemia in other chronic diseases classified elsewhere: Secondary | ICD-10-CM

## 2022-02-20 DIAGNOSIS — Z79899 Other long term (current) drug therapy: Secondary | ICD-10-CM | POA: Diagnosis not present

## 2022-02-20 DIAGNOSIS — N183 Chronic kidney disease, stage 3 unspecified: Secondary | ICD-10-CM | POA: Insufficient documentation

## 2022-02-20 DIAGNOSIS — D631 Anemia in chronic kidney disease: Secondary | ICD-10-CM | POA: Diagnosis not present

## 2022-02-20 LAB — CBC WITH DIFFERENTIAL (CANCER CENTER ONLY)
Abs Immature Granulocytes: 0.01 10*3/uL (ref 0.00–0.07)
Basophils Absolute: 0 10*3/uL (ref 0.0–0.1)
Basophils Relative: 0 %
Eosinophils Absolute: 0.1 10*3/uL (ref 0.0–0.5)
Eosinophils Relative: 2 %
HCT: 30.6 % — ABNORMAL LOW (ref 36.0–46.0)
Hemoglobin: 10.1 g/dL — ABNORMAL LOW (ref 12.0–15.0)
Immature Granulocytes: 0 %
Lymphocytes Relative: 28 %
Lymphs Abs: 1.1 10*3/uL (ref 0.7–4.0)
MCH: 30.3 pg (ref 26.0–34.0)
MCHC: 33 g/dL (ref 30.0–36.0)
MCV: 91.9 fL (ref 80.0–100.0)
Monocytes Absolute: 0.3 10*3/uL (ref 0.1–1.0)
Monocytes Relative: 8 %
Neutro Abs: 2.3 10*3/uL (ref 1.7–7.7)
Neutrophils Relative %: 62 %
Platelet Count: 160 10*3/uL (ref 150–400)
RBC: 3.33 MIL/uL — ABNORMAL LOW (ref 3.87–5.11)
RDW: 14.2 % (ref 11.5–15.5)
WBC Count: 3.8 10*3/uL — ABNORMAL LOW (ref 4.0–10.5)
nRBC: 0 % (ref 0.0–0.2)

## 2022-02-20 MED ORDER — DARBEPOETIN ALFA 100 MCG/0.5ML IJ SOSY
100.0000 ug | PREFILLED_SYRINGE | Freq: Once | INTRAMUSCULAR | Status: AC
  Administered 2022-02-20: 100 ug via SUBCUTANEOUS
  Filled 2022-02-20: qty 0.5

## 2022-02-20 NOTE — Patient Instructions (Signed)

## 2022-02-22 ENCOUNTER — Telehealth: Payer: Self-pay | Admitting: *Deleted

## 2022-02-22 NOTE — Patient Outreach (Signed)
  Care Coordination   02/22/2022 Name: [name] MRN: [id number] DOB: Jun 24, 1933   Care Coordination Outreach Attempts:  A third unsuccessful outreach was attempted today to offer the patient with information about available care coordination services as a benefit of their health plan.   Follow Up Plan:  No further outreach attempts will be made at this time. We have been unable to contact the patient to offer or enroll patient in care coordination services  Encounter Outcome:  No Answer  Care Coordination Interventions Activated:  No   Care Coordination Interventions:  No, not indicated    Raina Mina, RN Care Management Coordinator [place] Office [phone]

## 2022-02-23 DIAGNOSIS — N2581 Secondary hyperparathyroidism of renal origin: Secondary | ICD-10-CM | POA: Diagnosis not present

## 2022-02-23 DIAGNOSIS — N189 Chronic kidney disease, unspecified: Secondary | ICD-10-CM | POA: Diagnosis not present

## 2022-02-23 DIAGNOSIS — I129 Hypertensive chronic kidney disease with stage 1 through stage 4 chronic kidney disease, or unspecified chronic kidney disease: Secondary | ICD-10-CM | POA: Diagnosis not present

## 2022-02-23 DIAGNOSIS — D631 Anemia in chronic kidney disease: Secondary | ICD-10-CM | POA: Diagnosis not present

## 2022-02-23 DIAGNOSIS — N184 Chronic kidney disease, stage 4 (severe): Secondary | ICD-10-CM | POA: Diagnosis not present

## 2022-03-06 ENCOUNTER — Other Ambulatory Visit: Payer: Self-pay | Admitting: Cardiovascular Disease

## 2022-03-21 ENCOUNTER — Telehealth: Payer: Self-pay | Admitting: Internal Medicine

## 2022-03-21 ENCOUNTER — Other Ambulatory Visit: Payer: Self-pay | Admitting: Internal Medicine

## 2022-03-21 NOTE — Telephone Encounter (Signed)
Left message for patient to call back and schedule Medicare Annual Wellness Visit (AWV) either virtually or in office. Left  my jabber number [phone]  AWV-I per [place] 05/15/09  please schedule with Nurse Health Adviser   45 min for awv-i and in office appointments 30 min for awv-s  phone/virtual appointments  

## 2022-03-23 ENCOUNTER — Other Ambulatory Visit: Payer: Self-pay

## 2022-03-23 ENCOUNTER — Inpatient Hospital Stay: Payer: Medicare Other

## 2022-03-23 ENCOUNTER — Inpatient Hospital Stay: Payer: Medicare Other | Attending: Hematology

## 2022-03-23 VITALS — BP 152/55 | HR 62 | Temp 99.0°F | Resp 16

## 2022-03-23 DIAGNOSIS — D631 Anemia in chronic kidney disease: Secondary | ICD-10-CM | POA: Diagnosis not present

## 2022-03-23 DIAGNOSIS — Z79899 Other long term (current) drug therapy: Secondary | ICD-10-CM | POA: Insufficient documentation

## 2022-03-23 DIAGNOSIS — N183 Chronic kidney disease, stage 3 unspecified: Secondary | ICD-10-CM | POA: Diagnosis not present

## 2022-03-23 DIAGNOSIS — D638 Anemia in other chronic diseases classified elsewhere: Secondary | ICD-10-CM

## 2022-03-23 DIAGNOSIS — I129 Hypertensive chronic kidney disease with stage 1 through stage 4 chronic kidney disease, or unspecified chronic kidney disease: Secondary | ICD-10-CM | POA: Diagnosis not present

## 2022-03-23 LAB — CBC WITH DIFFERENTIAL (CANCER CENTER ONLY)
Abs Immature Granulocytes: 0.02 10*3/uL (ref 0.00–0.07)
Basophils Absolute: 0 10*3/uL (ref 0.0–0.1)
Basophils Relative: 1 %
Eosinophils Absolute: 0.2 10*3/uL (ref 0.0–0.5)
Eosinophils Relative: 5 %
HCT: 30.9 % — ABNORMAL LOW (ref 36.0–46.0)
Hemoglobin: 9.9 g/dL — ABNORMAL LOW (ref 12.0–15.0)
Immature Granulocytes: 1 %
Lymphocytes Relative: 26 %
Lymphs Abs: 0.8 10*3/uL (ref 0.7–4.0)
MCH: 29.9 pg (ref 26.0–34.0)
MCHC: 32 g/dL (ref 30.0–36.0)
MCV: 93.4 fL (ref 80.0–100.0)
Monocytes Absolute: 0.3 10*3/uL (ref 0.1–1.0)
Monocytes Relative: 9 %
Neutro Abs: 1.9 10*3/uL (ref 1.7–7.7)
Neutrophils Relative %: 58 %
Platelet Count: 186 10*3/uL (ref 150–400)
RBC: 3.31 MIL/uL — ABNORMAL LOW (ref 3.87–5.11)
RDW: 13.6 % (ref 11.5–15.5)
WBC Count: 3.2 10*3/uL — ABNORMAL LOW (ref 4.0–10.5)
nRBC: 0 % (ref 0.0–0.2)

## 2022-03-23 MED ORDER — DARBEPOETIN ALFA 100 MCG/0.5ML IJ SOSY
100.0000 ug | PREFILLED_SYRINGE | Freq: Once | INTRAMUSCULAR | Status: AC
  Administered 2022-03-23: 100 ug via SUBCUTANEOUS
  Filled 2022-03-23: qty 0.5

## 2022-03-23 NOTE — Patient Instructions (Signed)

## 2022-03-28 ENCOUNTER — Telehealth: Payer: Self-pay | Admitting: Pharmacist

## 2022-03-28 NOTE — Chronic Care Management (AMB) (Signed)
    Chronic Care Management Pharmacy Assistant   Name: [name]  MRN: [id number] DOB: Oct 26, 1933  03/29/2022 APPOINTMENT REMINDER  Called [name], No answer,unable to leave message of appointment on 03/29/2022 at 11:00 via telephone visit with [name], Pharm D.  Spoke with granddaughter, she is aware of appointment however, states to call her grandmother on her home phone.  Care Gaps: AWV - 03/28/22 message to [name] Last BP - 170/64 on 01/25/2022 Last HGA1C - 5.4 on 01/25/2022 AWV - never done Foot exam - never done Dexa scan - never done Covid  - overdue Eye exam - overdue  Star Rating Drug: Rosuvastatin 20 mg - last filled 03/03/2022 90 DS at [facility]   Any gaps in medications fill history? No  [name] [facility]  Catering manager [phone]

## 2022-03-29 ENCOUNTER — Telehealth: Payer: Self-pay | Admitting: Pharmacist

## 2022-03-29 ENCOUNTER — Telehealth: Payer: Medicare Other

## 2022-03-29 NOTE — Progress Notes (Deleted)
Chronic Care Management Pharmacy Note  03/29/2022 Name:  [name] MRN:  [id number] DOB:  February 12, 1934  Summary: BP not at goal < 140/90 per office readings Pt reports anxiety is not improving  Recommendations/Changes made from today's visit: -Recommended checking BP at home twice weekly -Recommended switching carvedilol 6.25 mg daily to metoprolol succinate 25 mg daily due to asthma and current SOB -Applied for PAP for Breztri and patient was approved -Recommend switching citalopram to sertraline -Recommended bringing BP cuff to next office visit  Plan: Follow up BP assessment in 4 weeks  Subjective: [name] is an 88 y.o. year old female who is a primary patient of Panosh, Standley Brooking, MD.  The CCM team was consulted for assistance with disease management and care coordination needs.    Engaged with patient face to face for follow up visit in response to provider referral for pharmacy case management and/or care coordination services.   Consent to Services:  The patient was given information about Chronic Care Management services, agreed to services, and gave verbal consent prior to initiation of services.  Please see initial visit note for detailed documentation.   Patient Care Team: Panosh, Standley Brooking, MD as PCP - General (Internal Medicine) Nahser, Wonda Cheng, MD as PCP - Cardiology (Cardiology) Marlou Sa Tonna Corner, MD (Orthopedic Surgery) Truitt Merle, MD as Consulting Physician (Hematology) Madelon Lips, MD as Consulting Physician (Nephrology) Monna Fam, MD as Consulting Physician (Ophthalmology) Viona Gilmore, [facility] as Pharmacist (Pharmacist) Tobi Bastos, RN as [facility] Feliz Beam, MD as Referring Physician (Ophthalmology) Collene Gobble, MD as Consulting Physician (Pulmonary Disease)  Recent office visits: 01/25/22 [name], MD: Patient presented for anxiety follow up. BP elevated in office.   09/26/2021  [name] MD - Patient was seen for anxiety state and additional issues. No medication changes. Follow up in 4 months.   Recent consult visits: 03/23/22 Cancer center: Patient presented for Aranesp injection.  02/07/22 Dwana Melena, MD (ophthalmology): Patient presented for follow up eye exam. Continue with azathioprine 50 mg 1/2 tablet daily.  01/19/22 Cira Rue, NP (hem/onc): Patient presented for anemia follow up.  Continue monthly injections and follow up in 4 months.  12/06/21 Mertie Moores, MD (cardiology): Patient presented for HTN follow up. Follow up in 6 months.  11/09/21 Madelon Lips, MD (nephrology): Patient presented for CKD follow up. Unable to access notes.  10/18/2021 Baltazar Apo MD (pulmonary) - Patient was seen for Chronic asthma without complication, unspecified asthma severity, unspecified whether persistent and additional issues. Albuterol 2.5 mg nebulization every 6 hours as needed. No follow up noted.    10/11/2021  Hillery Jacks LPN - Patient was seen at [facility] for anemia of chronic disease. Medication Administered Darbepoetin.  Hospital visits: None in previous 6 months   Objective:  Lab Results  Component Value Date   CREATININE 2.8 (A) 11/16/2021   BUN 41 (A) 11/16/2021   GFR 19.22 (L) 06/29/2021   GFRNONAA 20 (L) 09/12/2021   GFRAA 19 11/04/2020   NA 138 11/16/2021   K 3.7 11/16/2021   CALCIUM 9.3 11/16/2021   CO2 23 09/12/2021   GLUCOSE 128 (H) 09/12/2021    Lab Results  Component Value Date/Time   HGBA1C 5.4 01/25/2022 11:56 AM   HGBA1C 5.7 06/29/2021 02:15 PM   HGBA1C 5.7 01/27/2021 11:57 AM   GFR 19.22 (L) 06/29/2021 02:15 PM   GFR 16.08 (L) 01/27/2021 11:57 AM   MICROALBUR 119  03/02/2021 12:00 AM   MICROALBUR 103.8 11/04/2020 12:00 AM    Last diabetic Eye exam:  Lab Results  Component Value Date/Time   HMDIABEYEEXA Retinopathy (A) 06/10/2020 12:00 AM    Last diabetic Foot exam: No results found for: "HMDIABFOOTEX"    Lab Results  Component Value Date   CHOL 174 11/22/2021   HDL 79 11/22/2021   LDLCALC 79 11/22/2021   TRIG 87 11/22/2021   CHOLHDL 2.2 11/22/2021       Latest Ref Rng & Units 11/22/2021   10:13 AM 11/16/2021   12:00 AM 09/12/2021   12:27 AM  Hepatic Function  Total Protein 6.5 - 8.1 g/dL   6.3   Albumin 3.5 - 5.0  3.9     3.5   AST 15 - 41 U/L   21   ALT 0 - 32 IU/L 5   7   Alk Phosphatase 38 - 126 U/L   34   Total Bilirubin 0.3 - 1.2 mg/dL   0.7      This result is from an external source.    Lab Results  Component Value Date/Time   TSH 1.60 06/29/2021 02:15 PM   TSH 2.34 09/19/2019 11:45 AM   FREET4 1.01 06/29/2021 02:15 PM   FREET4 0.74 07/09/2017 03:57 PM       Latest Ref Rng & Units 03/23/2022    2:32 PM 02/20/2022    2:11 PM 01/19/2022   11:15 AM  CBC  WBC 4.0 - 10.5 K/uL 3.2  3.8  3.3   Hemoglobin 12.0 - 15.0 g/dL 9.9  10.1  8.9   Hematocrit 36.0 - 46.0 % 30.9  30.6  28.4   Platelets 150 - 400 K/uL 186  160  161     Lab Results  Component Value Date/Time   VD25OH 32.77 07/09/2017 03:57 PM    Clinical ASCVD: No  The ASCVD Risk score (Arnett DK, et al., 2019) failed to calculate for the following reasons:   The 2019 ASCVD risk score is only valid for ages 78 to 42       09/26/2021   10:42 AM 07/21/2020   11:13 AM 05/21/2019   10:45 AM  Depression screen PHQ 2/9  Decreased Interest 1 0 0  Down, Depressed, Hopeless 0 0 0  PHQ - 2 Score 1 0 0  Altered sleeping 0 0   Tired, decreased energy 0 1   Change in appetite 0 0   Feeling bad or failure about yourself  0 0   Trouble concentrating 0 0   Moving slowly or fidgety/restless 0 0   Suicidal thoughts 0 0   PHQ-9 Score 1 1   Difficult doing work/chores Not difficult at all Not difficult at all      Social History   Tobacco Use  Smoking Status Never  Smokeless Tobacco Never   BP Readings from Last 3 Encounters:  03/23/22 (!) 152/55  02/20/22 (!) 168/50  01/25/22 (!) 170/64   Pulse Readings from  Last 3 Encounters:  03/23/22 62  02/20/22 62  01/25/22 (!) 54   Wt Readings from Last 3 Encounters:  01/25/22 144 lb 6.4 oz (65.5 kg)  01/19/22 147 lb (66.7 kg)  12/06/21 146 lb 6.4 oz (66.4 kg)   BMI Readings from Last 3 Encounters:  01/25/22 28.20 kg/m  01/19/22 28.71 kg/m  12/06/21 28.59 kg/m    Assessment/Interventions: Review of patient past medical history, allergies, medications, health status, including review of consultants reports, laboratory and other  test data, was performed as part of comprehensive evaluation and provision of chronic care management services.   SDOH:  (Social Determinants of Health) assessments and interventions performed: Yes *** SDOH Interventions    Flowsheet Row Office Visit from 07/21/2020 in [place] [facility] from 12/31/2019 in [place] at [place]  SDOH Interventions    Transportation Interventions -- Intervention Not Indicated  Depression Interventions/Treatment  PHQ2-9 Score <4 Follow-up Not Indicated --  Financial Strain Interventions -- Intervention Not Indicated      SDOH Screenings   Transportation Needs: No Transportation Needs (01/01/2020)  Depression (PHQ2-9): Low Risk  (09/26/2021)  Financial Resource Strain: Low Risk  (01/01/2020)  Tobacco Use: Low Risk  (02/15/2022)    CCM Care Plan  Allergies  Allergen Reactions   Penicillins Nausea And Vomiting and Other (See Comments)    Has patient had a PCN reaction causing immediate rash, facial/tongue/throat swelling, SOB or lightheadedness with hypotension: Y Has patient had a PCN reaction causing severe rash involving mucus membranes or skin necrosis: Y Has patient had a PCN reaction that required hospitalization: N Has patient had a PCN reaction occurring within the last 10 years: N If all of the above answers are "NO", then may proceed with Cephalosporin use.    Aspirin Other (See Comments)    Wheezing Acetaminophen is OK     Medications Reviewed Today     Reviewed by Burnis Medin, MD (Physician) on 01/25/22 at 1351  Med List Status: <None>   Medication Order Taking? Sig Documenting Provider Last Dose Status Informant  acetaminophen (TYLENOL) 500 MG tablet [id number] Yes Take 500 mg by mouth in the morning and at bedtime. [provider] Taking Active Self  albuterol (PROVENTIL) (2.5 MG/3ML) 0.083% nebulizer solution [id number] Yes Take 3 mLs (2.5 mg total) by nebulization every 6 (six) hours as needed for shortness of breath or wheezing. Collene Gobble, MD Taking Active   albuterol (VENTOLIN HFA) 108 (90 Base) MCG/ACT inhaler [id number] Yes USE 2 PUFFS EVERY 6 HOURS AS NEEDED FOR WHEEZING. Collene Gobble, MD Taking Active   amLODipine (NORVASC) 5 MG tablet [id number] Yes Take 1 tablet (5 mg total) by mouth daily. Nahser, Wonda Cheng, MD Taking Active   azaTHIOprine (IMURAN) 50 MG tablet [id number] Yes Take 25 mg by mouth at bedtime. [provider] Taking Active Self  Azelastine-Fluticasone 137-50 MCG/ACT SUSP [id number] Yes PLACE ONE SPRAY IN EACH NOSTRIL TWICE DAILY (IN THE MORNING AND AT BEDTIME) Collene Gobble, MD Taking Active   Budeson-Glycopyrrol-Formoterol (BREZTRI AEROSPHERE) 160-9-4.8 MCG/ACT AERO [id number] Yes INHALE 2 PUFFS INTO THE LUNGS TWICE DAILY, IN THE MORNING & AT BEDTIME.  Patient taking differently: Inhale 2 puffs into the lungs in the morning and at bedtime.   Collene Gobble, MD Taking Active Self  carvedilol (COREG) 6.25 MG tablet [id number] Yes Take 6.25 mg by mouth daily. [provider] Taking Active Self  cetirizine (ZYRTEC) 10 MG tablet [id number] Yes Take 10 mg by mouth daily. [provider] Taking Active Self  citalopram (CELEXA) 20 MG tablet [id number] Yes Take 1 tablet (20 mg total) by mouth every evening. Panosh, Standley Brooking, MD Taking Active   cloNIDine (CATAPRES) 0.1 MG tablet [id number] Yes Take 1 tablet every 12 hours as needed for blood pressures  greater than 468 systolic  Patient taking differently: Take 0.1 mg by mouth daily.   Veryl Speak, MD Taking Active Self  clopidogrel (PLAVIX) 75 MG tablet [id number] Yes TAKE ONE TABLET  BY MOUTH ONCE DAILY. Panosh, Standley Brooking, MD Taking Active   diclofenac Sodium (VOLTAREN) 1 % GEL [id number] Yes Apply 4 g topically 4 (four) times daily.  Patient taking differently: Apply 4 g topically 2 (two) times daily as needed (painful areas).   Panosh, Standley Brooking, MD Taking Active Self  doxazosin (CARDURA) 2 MG tablet [id number] Yes Take 3 tablets (6 mg total) by mouth at bedtime. Panosh, Standley Brooking, MD Taking Active Self  ferrous sulfate 325 (65 FE) MG EC tablet [id number] Yes Take 1 tablet (325 mg total) by mouth daily with breakfast. Panosh, Standley Brooking, MD Taking Active Child, Self  fluticasone (FLONASE) 50 MCG/ACT nasal spray [id number] No Place 2 sprays into both nostrils daily.  Patient not taking: Reported on 01/25/2022   Collene Gobble, MD Not Taking Active Self  furosemide (LASIX) 40 MG tablet [id number] Yes Take 40-80 mg by mouth See admin instructions. 80 mg in the am and 40 mg in the pm [provider] Taking Active Self  hydrALAZINE (APRESOLINE) 50 MG tablet [id number] Yes Take 1.5 tablets (75 mg total) by mouth 3 (three) times daily. Nahser, Wonda Cheng, MD Taking Active   LORazepam (ATIVAN) 0.5 MG tablet [id number] Yes Take 1 tablet (0.5 mg total) by mouth 2 (two) times daily as needed for anxiety. Panosh, Standley Brooking, MD Taking Active   montelukast (SINGULAIR) 10 MG tablet [id number] Yes TAKE ONE TABLET BY MOUTH ONCE DAILY Panosh, Standley Brooking, MD Taking Active Self  ondansetron (ZOFRAN-ODT) 8 MG disintegrating tablet [id number] No 52m ODT q4 hours prn nausea  Patient not taking: Reported on 92/35/3614  GDelora Fuel MD Not Taking Active   pantoprazole (PROTONIX) 40 MG tablet [id number] Take 1 tablet (40 mg total) by mouth 2 (two) times daily. Panosh, WStandley Brooking MD Taking Active Self  potassium chloride  (KLOR-CON) 10 MEQ tablet [id number] TAKE FOUR TABLETS BY MOUTH DAILY. Panosh, WStandley Brooking MD Taking Active   prednisoLONE acetate (PRED FORTE) 1 % ophthalmic suspension [id number] Place 1 drop into the right eye 4 (four) times daily. [provider] Taking Active   RESTASIS 0.05 % ophthalmic emulsion [id number] 1 drop 2 (two) times daily. [provider] Taking Active   rosuvastatin (CRESTOR) 20 MG tablet [id number] Take 1 tablet (20 mg total) by mouth daily.  Patient taking differently: Take 20 mg by mouth at bedtime.   Nahser, PWonda Cheng MD Taking Active Self  Spacer/Aero-Holding Chambers (AEROCHAMBER PLUS) inhaler [id number] Use as instructed MMelynda Ripple MD Taking Active Child, Self  vitamin B-12 (CYANOCOBALAMIN) 1000 MCG tablet [id number] Take 1,000 mcg by mouth daily. [provider] Taking Active Child, Self  Vitamin D, Cholecalciferol, 25 MCG (1000 UT) TABS [id number] Take 1,000 Units by mouth daily. [provider] Taking Active Self            Patient Active Problem List   Diagnosis Date Noted   Pulmonary hypertension, unspecified (HQulin 12/06/2021   Chronic cough 10/18/2021   Chronic rhinitis 11/24/2019   Nausea 11/03/2018   Elevated troponin 11/03/2018   Hyponatremia 11/03/2018   Hypertensive heart disease with hypertensive chronic kidney disease 11/30/2017   Fasting hyperglycemia 11/30/2017   Renal cyst 03/27/2016   CKD (chronic kidney disease) stage 3, GFR 30-59 ml/min (HCC) 08/23/2015   Perceived hearing changes 06/29/2014   Resistant hypertension 06/29/2014   Lumbago 06/15/2014   Pre-diabetes vs early DM  06/15/2014   Dyspnea 04/27/2014   Asthma, chronic 04/13/2014  Elevated uric acid in blood 04/13/2014   Essential hypertension 03/27/2014   History of DVT (deep vein thrombosis)    Palpitations 01/05/2014   Anemia of chronic disease 12/27/2013   Death of family member Aug 31, 2013   Leg edema 06/20/2013    Bereavement due to life event 04/21/2013   Other dysphagia 02/11/2013   Colon cancer screening 02/11/2013   Anxiety state 01/20/2013   Leg cramps 01/20/2013   Back pain 12/05/2012   Family hx of colon cancer 10/21/2012   Family hx of lung cancer 10/21/2012   Family hx-breast malignancy 10/21/2012   Renal insufficiency creatinine 1.3 10/21/2012   Anemia, chronic disease 10/21/2012   GERD (gastroesophageal reflux disease) 09/08/2012   HTN (hypertension) 09/08/2012   Hyperlipidemia 09/08/2012   Varicose veins of leg with complications 31/59/4585    Immunization History  Administered Date(s) Administered   Fluad Quad(high Dose 65+) 01/17/2019, 02/16/2020, 01/27/2021, 01/25/2022   Influenza, High Dose Seasonal PF 02/17/2014, 03/01/2015, 02/21/2016, 03/12/2017, 01/31/2018   Influenza,inj,Quad PF,6+ Mos 01/20/2013   Moderna Sars-Covid-2 Vaccination 04/29/2020   PFIZER(Purple Top)SARS-COV-2 Vaccination 06/07/2019, 06/28/2019, 09/10/2020   Pneumococcal Conjugate-13 02/21/2016   Pneumococcal Polysaccharide-23 03/10/2020   Pneumococcal-Unspecified 01/13/2009   Td 04/03/2014   Unspecified SARS-COV-2 Vaccination 04/01/2021   Zoster Recombinat (Shingrix) 02/12/2019, 04/18/2019   Patient reports she doesn't feel like the citalopram is helping anymore. She hasn't tried anything else as far as anxiety goes. She doesn't report any issues with sleep other than following an abnormal sleep schedule as she stays up pretty late. She still feels jittery some and is trying not to use the as needed medication every day.  -switch carvedilol to bisoprolol for asthma?  -switch rosuvastatin back to atorvastatin? Max is 10 daily with her CrCl  -checking BP?  -Recommended switching carvedilol 6.25 mg daily to metoprolol succinate 25 mg daily due to asthma and current SOB ?  Conditions to be addressed/monitored:  Hypertension, Hyperlipidemia, GERD, Asthma, Chronic Kidney Disease, Anxiety, Allergic Rhinitis,  and Pre-diabetes  Conditions addressed this visit: Anxiety, hypertension, asthma  There are no care plans that you recently modified to display for this patient.   Medication Assistance: None required.  Patient affirms current coverage meets needs.  Compliance/Adherence/Medication fill history: Care Gaps: DEXA, foot exam, COVID booster, eye exam, AWV Last BP - 170/64 on 01/25/2022 Last HGA1C - 5.4 on 01/25/2022  Star-Rating Drugs: Rosuvastatin 20 mg - last filled 03/03/2022 90 DS at [facility]   Patient's preferred pharmacy is:  [place], [place] [address] Phone: [phone] Fax: [phone]   Uses pill box? Yes Pt endorses 95% compliance  We discussed: Benefits of medication synchronization, packaging and delivery as well as enhanced pharmacist oversight with [employer]. Patient decided to: Continue current medication management strategy  Care Plan and Follow Up Patient Decision:  Patient agrees to Care Plan and Follow-up.  Plan: The care management team will reach out to the patient again over the next 30 days.  [name], PharmD, [place] Pharmacist [place] [facility] [place]

## 2022-03-29 NOTE — Telephone Encounter (Signed)
  Chronic Care Management   Outreach Note  03/29/2022 Name: [name] MRN: [id number] DOB: 25-Sep-1933  Referred by: Burnis Medin, MD  Patient had a phone appointment scheduled with clinical pharmacist today.  An unsuccessful telephone outreach was attempted today. The patient was referred to the pharmacist for assistance with care management and care coordination.   If possible, a message was left to return call to: [phone] or to [facility] at [facility]: [place], PharmD, [place] Pharmacist [place] [facility] [place]

## 2022-04-04 ENCOUNTER — Telehealth: Payer: Self-pay | Admitting: Pharmacist

## 2022-04-04 NOTE — Chronic Care Management (AMB) (Signed)
    Chronic Care Management Pharmacy Assistant   Name: [name]  MRN: [id number] DOB: Jun 19, 1933  Reason for Encounter: Reschedule appointment   Rescheduled with patient, appointment with [name] Clinical Pharmacist for 04/14/22.   [place] Pharmacist Assistant [phone]

## 2022-04-05 NOTE — Telephone Encounter (Signed)
Contact patient to ask question regarding to the Rx request. Left a voicemail to call us back.

## 2022-04-05 NOTE — Telephone Encounter (Signed)
This appears to be a 88 years old request and not sure what tjhois means . Please advise  as seems  mpt meeded?

## 2022-04-08 ENCOUNTER — Other Ambulatory Visit: Payer: Self-pay | Admitting: Internal Medicine

## 2022-04-12 ENCOUNTER — Other Ambulatory Visit: Payer: Self-pay | Admitting: Cardiovascular Disease

## 2022-04-12 NOTE — Telephone Encounter (Signed)
[facility] patient last picked up prescription for Montelukast 10mg  on 11/14/21.   Patient last picked up prescription for Potassium on 03/23/22.

## 2022-04-14 ENCOUNTER — Ambulatory Visit (INDEPENDENT_AMBULATORY_CARE_PROVIDER_SITE_OTHER): Payer: Medicare Other | Admitting: Pharmacist

## 2022-04-14 DIAGNOSIS — J45909 Unspecified asthma, uncomplicated: Secondary | ICD-10-CM

## 2022-04-14 DIAGNOSIS — I10 Essential (primary) hypertension: Secondary | ICD-10-CM

## 2022-04-14 NOTE — Patient Instructions (Signed)
Hi [name],  It was great to get to catch up again! Keep on checking your blood pressures at home like we talked about after your medications and bring it to your next appointment to make sure it's pretty close to the office readings.  Please reach out to me if you have any questions or need anything before I reach back out!  Best, Maddie  [name], PharmD, [facility]   Visit Information   Goals Addressed   None    Patient Care Plan: [facility]CM Pharmacy Care Plan     Problem Identified: Problem: Hypertension, Hyperlipidemia, GERD, Asthma, Chronic Kidney Disease, Anxiety, Allergic Rhinitis, and Pre-diabetes      Long-Range Goal: Patient-Specific Goal   Start Date: 11/30/2020  Expected End Date: 11/30/2021  Recent Progress: On track  Priority: High  Note:   Current Barriers:  Unable to independently monitor therapeutic efficacy Unable to achieve control of blood pressure  Suboptimal therapeutic regimen for blood pressure  Pharmacist Clinical Goal(s):  Patient will achieve adherence to monitoring guidelines and medication adherence to achieve therapeutic efficacy achieve control of blood pressure as evidenced by home readings and office readings  through collaboration with PharmD and provider.   Interventions: 1:1 collaboration with Panosh, Standley Brooking, MD regarding development and update of comprehensive plan of care as evidenced by provider attestation and co-signature Inter-disciplinary care team collaboration (see longitudinal plan of care) Comprehensive medication review performed; medication list updated in electronic medical record  Hypertension (BP goal <140/90) -Uncontrolled -Current treatment: Amlodipine 5 mg 1 tablet daily - in AM - Appropriate, Query effective, Safe, Accessible Doxazosin 2 mg 3 tablets at bedtime - in PM - Query Appropriate, Query effective, Query Safe, Accessible Carvedilol 6.25 mg daily  - in AM - Query Appropriate, Query effective, Query Safe, Accessible Hydralazine 50 mg 1.5 tablets three times daily - Appropriate, Query effective, Safe, Accessible Furosemide 40 mg 2 tablets in AM and 1 tablet in PM - Appropriate, Query effective, Safe, Accessible -Medications previously tried: unknown  -Current home readings: 160/65 (average of the last 3), 148/57, 173/70; patient just got a new BP cuff -Current dietary habits: puts a little bit of salt in food - used when cooking  -Current exercise habits: limited -Denies hypotensive/hypertensive symptoms -Educated on BP goals and benefits of medications for prevention of heart attack, stroke and kidney damage; Exercise goal of 150 minutes per week; Importance of home blood pressure monitoring; Proper BP monitoring technique; -Counseled to monitor BP at home twice a week, document, and provide log at future appointments -Counseled on diet and exercise extensively Recommended to continue current medication Recommended switching carvedilol to metoprolol or bisoprolol due to asthma.  Hyperlipidemia: (LDL goal < 70) -Uncontrolled -Current treatment: Rosvuastatin 20 mg 1 tablet daily - Appropriate, Effective, Query Safe, Accessible -Medications previously tried: none  -Current dietary patterns: did not discuss -Current exercise habits: limited with SOB -Educated on Cholesterol goals;  -Recommended switching rosuvastatin to atorvastatin 40 mg given her kidney function.  Diabetes (A1c goal <6.5%) -Controlled -Current medications: No medications -Medications previously tried: none  -Current home glucose readings fasting glucose: does not need to check post prandial glucose: does not need to check -Denies hypoglycemic/hyperglycemic symptoms -Current meal patterns:  breakfast: did not discuss  lunch: did not discuss   dinner: did not discuss  snacks: did not discuss  drinks: did not discuss  -Current exercise: limited due to  SOB -Educated on A1c and blood sugar goals; Carbohydrate counting  and/or plate method -Counseled to check feet daily and get yearly eye exams -Counseled on diet and exercise extensively  Asthma (Goal: control symptoms) -Uncontrolled -Current treatment  Breztri 160-9-4.8 mcg/act 2 puffs twice daily - Appropriate, Effective, Safe, Accessible Albuterol HFA as needed - Appropriate, Effective, Safe, Accessible Albuterol nebulizer as needed - Appropriate, Effective, Safe, Accessible Montelukast 10 mg 1 tablet daily - Appropriate, Effective, Safe, Accessible -Medications previously tried: n/a  -Pulmonary function testing: FEV1 66% (11/26/17) -Exacerbations requiring treatment in last 6 months: none -Patient reports consistent use of maintenance inhaler -Frequency of rescue inhaler use: more frequently; every morning -Counseled on Proper inhaler technique; Benefits of consistent maintenance inhaler use When to use rescue inhaler -Recommended to continue current medication Assessed patient finances. Applied for [employer] patient assistance and was approved.  Depression/Anxiety (Goal: minimize symptoms) -Not ideally controlled -Current treatment: Citalopram 20 mg 1 tablet daily - Appropriate, Query effective, Safe, Accessible Lorazepam 1 mg 1 tablet every 8 hours as needed - Appropriate, Effective, Query Safe, Accessible -Medications previously tried/failed: none -PHQ9: 1 -GAD7: 3 -Educated on Benefits of medication for symptom control -Recommended to continue current medication Counseled on long term risks of taking benzodiazepines and limiting use. Recommended switching citalopram to alternative SSRI.  Allergic rhinitis (Goal: minimize symptoms) -Controlled -Current treatment  Loratadine 10 mg 1 tablet daily as needed - Appropriate, Effective, Safe, Accessible Fluticasone 50 mcg/act 2 sprays in nostrils daily - Appropriate, Effective, Safe, Accessible -Medications previously tried:  Dymista  -Recommended to continue current medication  GERD (Goal: minimize symptoms) -Controlled -Current treatment  Pantoprazole 40 mg 1 tablet twice daily before meals - Appropriate, Effective, Query Safe, Accessible -Medications previously tried: none  -Recommended to continue current medication  History of DVT (Goal: prevent blood clots) -Controlled -Current treatment  Clopidogrel 75 mg 1 tablet daily - Query Appropriate, Effective, Safe, Accessible -Medications previously tried: none  - Reassess the need at follow up.   Health Maintenance -Vaccine gaps: COVID booster -Current therapy:  Potassium chloride 10 mEq 4 tablets daily  Diclofenac gel 1% as needed Ferrous sulfate 325 mg 1 tablet daily with breakfast Vitamin B12 1000 mcg daily Vitamin D 1000 units daily -Educated on Cost vs benefit of each product must be carefully weighed by individual consumer -Patient is satisfied with current therapy and denies issues -Recommended to continue current medication  Patient Goals/Self-Care Activities Patient will:  - take medications as prescribed check blood pressure twice weekly, document, and provide at future appointments target a minimum of 150 minutes of moderate intensity exercise weekly  Follow Up Plan: The care management team will reach out to the patient again over the next 21 days.         Patient verbalizes understanding of instructions and care plan provided today and agrees to view in [place]. Active MyChart status and patient understanding of how to access instructions and care plan via MyChart confirmed with patient.    The pharmacy team will reach out to the patient again over the next 14 days.   Viona Gilmore, [facility]

## 2022-04-14 NOTE — Progress Notes (Signed)
Chronic Care Management Pharmacy Note  04/14/2022 Name:  [name] MRN:  [id number] DOB:  02-08-1934  Summary: BP not at goal < 140/90 per home or office readings Pt inquired about stopping any medications  Recommendations/Changes made from today's visit: -Recommended bringing BP cuff to next office visit -Recommended switching carvedilol 6.25 mg daily to metoprolol succinate 25 mg daily due to asthma and current SOB -Consider stopping doxazosin to maximize on beta blocker and decrease pill burden -Recommend switching rosuvastatin to atorvastatin 40 mg due to kidney function   Plan: Follow up in 1 month with PCP for BP med adjustment   Subjective: [name] is an 88 y.o. year old female who is a primary patient of Panosh, Standley Brooking, MD.  The CCM team was consulted for assistance with disease management and care coordination needs.    Engaged with patient face to face for follow up visit in response to provider referral for pharmacy case management and/or care coordination services.   Consent to Services:  The patient was given information about Chronic Care Management services, agreed to services, and gave verbal consent prior to initiation of services.  Please see initial visit note for detailed documentation.   Patient Care Team: Panosh, Standley Brooking, MD as PCP - General (Internal Medicine) Nahser, Wonda Cheng, MD as PCP - Cardiology (Cardiology) Marlou Sa Tonna Corner, MD (Orthopedic Surgery) Truitt Merle, MD as Consulting Physician (Hematology) Madelon Lips, MD as Consulting Physician (Nephrology) Monna Fam, MD as Consulting Physician (Ophthalmology) Viona Gilmore, [facility] as Pharmacist (Pharmacist) Tobi Bastos, RN as [facility] Feliz Beam, MD as Referring Physician (Ophthalmology) Collene Gobble, MD as Consulting Physician (Pulmonary Disease)  Recent office visits: 01/25/22 Angela Ace, MD: Patient presented for anxiety  follow up. BP elevated in office.   09/26/2021 Angela Ace MD - Patient was seen for anxiety state and additional issues. No medication changes. Follow up in 4 months.   Recent consult visits: 03/23/22 Cancer center: Patient presented for Aranesp injection.  02/07/22 Dwana Melena, MD (ophthalmology): Patient presented for follow up eye exam. Continue with azathioprine 50 mg 1/2 tablet daily.  01/19/22 Cira Rue, NP (hem/onc): Patient presented for anemia follow up.  Continue monthly injections and follow up in 4 months.  12/06/21 Mertie Moores, MD (cardiology): Patient presented for HTN follow up. Follow up in 6 months.  11/09/21 Madelon Lips, MD (nephrology): Patient presented for CKD follow up. Unable to access notes.  10/18/2021 Baltazar Apo MD (pulmonary) - Patient was seen for Chronic asthma without complication, unspecified asthma severity, unspecified whether persistent and additional issues. Albuterol 2.5 mg nebulization every 6 hours as needed. No follow up noted.    10/11/2021  Hillery Jacks LPN - Patient was seen at [facility] for anemia of chronic disease. Medication Administered Darbepoetin.  Hospital visits: None in previous 6 months   Objective:  Lab Results  Component Value Date   CREATININE 2.8 (A) 11/16/2021   BUN 41 (A) 11/16/2021   GFR 19.22 (L) 06/29/2021   GFRNONAA 20 (L) 09/12/2021   GFRAA 19 11/04/2020   NA 138 11/16/2021   K 3.7 11/16/2021   CALCIUM 9.3 11/16/2021   CO2 23 09/12/2021   GLUCOSE 128 (H) 09/12/2021    Lab Results  Component Value Date/Time   HGBA1C 5.4 01/25/2022 11:56 AM   HGBA1C 5.7 06/29/2021 02:15 PM   HGBA1C 5.7 01/27/2021 11:57 AM   GFR 19.22 (L) 06/29/2021 02:15 PM  GFR 16.08 (L) 01/27/2021 11:57 AM   MICROALBUR 119 03/02/2021 12:00 AM   MICROALBUR 103.8 11/04/2020 12:00 AM    Last diabetic Eye exam:  Lab Results  Component Value Date/Time   HMDIABEYEEXA Retinopathy (A) 06/10/2020 12:00 AM    Last diabetic  Foot exam: No results found for: "HMDIABFOOTEX"   Lab Results  Component Value Date   CHOL 174 11/22/2021   HDL 79 11/22/2021   LDLCALC 79 11/22/2021   TRIG 87 11/22/2021   CHOLHDL 2.2 11/22/2021       Latest Ref Rng & Units 11/22/2021   10:13 AM 11/16/2021   12:00 AM 09/12/2021   12:27 AM  Hepatic Function  Total Protein 6.5 - 8.1 g/dL   6.3   Albumin 3.5 - 5.0  3.9     3.5   AST 15 - 41 U/L   21   ALT 0 - 32 IU/L 5   7   Alk Phosphatase 38 - 126 U/L   34   Total Bilirubin 0.3 - 1.2 mg/dL   0.7      This result is from an external source.    Lab Results  Component Value Date/Time   TSH 1.60 06/29/2021 02:15 PM   TSH 2.34 09/19/2019 11:45 AM   FREET4 1.01 06/29/2021 02:15 PM   FREET4 0.74 07/09/2017 03:57 PM       Latest Ref Rng & Units 03/23/2022    2:32 PM 02/20/2022    2:11 PM 01/19/2022   11:15 AM  CBC  WBC 4.0 - 10.5 K/uL 3.2  3.8  3.3   Hemoglobin 12.0 - 15.0 g/dL 9.9  10.1  8.9   Hematocrit 36.0 - 46.0 % 30.9  30.6  28.4   Platelets 150 - 400 K/uL 186  160  161     Lab Results  Component Value Date/Time   VD25OH 32.77 07/09/2017 03:57 PM    Clinical ASCVD: No  The ASCVD Risk score (Arnett DK, et al., 2019) failed to calculate for the following reasons:   The 2019 ASCVD risk score is only valid for ages 86 to 80       09/26/2021   10:42 AM 07/21/2020   11:13 AM 05/21/2019   10:45 AM  Depression screen PHQ 2/9  Decreased Interest 1 0 0  Down, Depressed, Hopeless 0 0 0  PHQ - 2 Score 1 0 0  Altered sleeping 0 0   Tired, decreased energy 0 1   Change in appetite 0 0   Feeling bad or failure about yourself  0 0   Trouble concentrating 0 0   Moving slowly or fidgety/restless 0 0   Suicidal thoughts 0 0   PHQ-9 Score 1 1   Difficult doing work/chores Not difficult at all Not difficult at all      Social History   Tobacco Use  Smoking Status Never  Smokeless Tobacco Never   BP Readings from Last 3 Encounters:  03/23/22 (!) 152/55  02/20/22 (!)  168/50  01/25/22 (!) 170/64   Pulse Readings from Last 3 Encounters:  03/23/22 62  02/20/22 62  01/25/22 (!) 54   Wt Readings from Last 3 Encounters:  01/25/22 144 lb 6.4 oz (65.5 kg)  01/19/22 147 lb (66.7 kg)  12/06/21 146 lb 6.4 oz (66.4 kg)   BMI Readings from Last 3 Encounters:  01/25/22 28.20 kg/m  01/19/22 28.71 kg/m  12/06/21 28.59 kg/m    Assessment/Interventions: Review of patient past medical history, allergies, medications,  health status, including review of consultants reports, laboratory and other test data, was performed as part of comprehensive evaluation and provision of chronic care management services.   SDOH:  (Social Determinants of Health) assessments and interventions performed: Yes  SDOH Interventions    Flowsheet Row Chronic Care Management from 04/14/2022 in [place] [facility] [place] from 07/21/2020 in [place] [facility] [place] [facility] from 12/31/2019 in [name] at [place]  SDOH Interventions     Transportation Interventions -- -- Intervention Not Indicated  Depression Interventions/Treatment  -- PHQ2-9 Score <4 Follow-up Not Indicated --  Financial Strain Interventions Intervention Not Indicated -- Intervention Not Indicated      SDOH Screenings   Transportation Needs: No Transportation Needs (01/01/2020)  Depression (PHQ2-9): Low Risk  (09/26/2021)  Financial Resource Strain: Low Risk  (04/14/2022)  Tobacco Use: Low Risk  (02/15/2022)    CCM Care Plan  Allergies  Allergen Reactions   Penicillins Nausea And Vomiting and Other (See Comments)    Has patient had a PCN reaction causing immediate rash, facial/tongue/throat swelling, SOB or lightheadedness with hypotension: Y Has patient had a PCN reaction causing severe rash involving mucus membranes or skin necrosis: Y Has patient had a PCN reaction that required hospitalization: N Has patient had a PCN reaction occurring within the last 10  years: N If all of the above answers are "NO", then may proceed with Cephalosporin use.    Aspirin Other (See Comments)    Wheezing Acetaminophen is OK    Medications Reviewed Today     Reviewed by Burnis Medin, MD (Physician) on 01/25/22 at 1351  Med List Status: <None>   Medication Order Taking? Sig Documenting Provider Last Dose Status Informant  acetaminophen (TYLENOL) 500 MG tablet [id number] Yes Take 500 mg by mouth in the morning and at bedtime. [provider] Taking Active Self  albuterol (PROVENTIL) (2.5 MG/3ML) 0.083% nebulizer solution [id number] Yes Take 3 mLs (2.5 mg total) by nebulization every 6 (six) hours as needed for shortness of breath or wheezing. Collene Gobble, MD Taking Active   albuterol (VENTOLIN HFA) 108 (90 Base) MCG/ACT inhaler [id number] Yes USE 2 PUFFS EVERY 6 HOURS AS NEEDED FOR WHEEZING. Collene Gobble, MD Taking Active   amLODipine (NORVASC) 5 MG tablet [id number] Yes Take 1 tablet (5 mg total) by mouth daily. Nahser, Wonda Cheng, MD Taking Active   azaTHIOprine (IMURAN) 50 MG tablet [id number] Yes Take 25 mg by mouth at bedtime. [provider] Taking Active Self  Azelastine-Fluticasone 137-50 MCG/ACT SUSP [id number] Yes PLACE ONE SPRAY IN EACH NOSTRIL TWICE DAILY (IN THE MORNING AND AT BEDTIME) Collene Gobble, MD Taking Active   Budeson-Glycopyrrol-Formoterol (BREZTRI AEROSPHERE) 160-9-4.8 MCG/ACT AERO [id number] Yes INHALE 2 PUFFS INTO THE LUNGS TWICE DAILY, IN THE MORNING & AT BEDTIME.  Patient taking differently: Inhale 2 puffs into the lungs in the morning and at bedtime.   Collene Gobble, MD Taking Active Self  carvedilol (COREG) 6.25 MG tablet [id number] Yes Take 6.25 mg by mouth daily. [provider] Taking Active Self  cetirizine (ZYRTEC) 10 MG tablet [id number] Yes Take 10 mg by mouth daily. [provider] Taking Active Self  citalopram (CELEXA) 20 MG tablet [id number] Yes Take 1 tablet (20 mg total) by mouth every  evening. Panosh, Standley Brooking, MD Taking Active   cloNIDine (CATAPRES) 0.1 MG tablet [id number] Yes Take 1 tablet every 12 hours as needed for blood pressures greater than 863 systolic  Patient taking  differently: Take 0.1 mg by mouth daily.   Veryl Speak, MD Taking Active Self  clopidogrel (PLAVIX) 75 MG tablet [id number] Yes TAKE ONE TABLET BY MOUTH ONCE DAILY. Panosh, Standley Brooking, MD Taking Active   diclofenac Sodium (VOLTAREN) 1 % GEL [id number] Yes Apply 4 g topically 4 (four) times daily.  Patient taking differently: Apply 4 g topically 2 (two) times daily as needed (painful areas).   Panosh, Standley Brooking, MD Taking Active Self  doxazosin (CARDURA) 2 MG tablet [id number] Yes Take 3 tablets (6 mg total) by mouth at bedtime. Panosh, Standley Brooking, MD Taking Active Self  ferrous sulfate 325 (65 FE) MG EC tablet [id number] Yes Take 1 tablet (325 mg total) by mouth daily with breakfast. Panosh, Standley Brooking, MD Taking Active Child, Self  fluticasone (FLONASE) 50 MCG/ACT nasal spray [id number] No Place 2 sprays into both nostrils daily.  Patient not taking: Reported on 01/25/2022   Collene Gobble, MD Not Taking Active Self  furosemide (LASIX) 40 MG tablet [id number] Yes Take 40-80 mg by mouth See admin instructions. 80 mg in the am and 40 mg in the pm [provider] Taking Active Self  hydrALAZINE (APRESOLINE) 50 MG tablet [id number] Yes Take 1.5 tablets (75 mg total) by mouth 3 (three) times daily. Nahser, Wonda Cheng, MD Taking Active   LORazepam (ATIVAN) 0.5 MG tablet [id number] Yes Take 1 tablet (0.5 mg total) by mouth 2 (two) times daily as needed for anxiety. Panosh, Standley Brooking, MD Taking Active   montelukast (SINGULAIR) 10 MG tablet [id number] Yes TAKE ONE TABLET BY MOUTH ONCE DAILY Panosh, Standley Brooking, MD Taking Active Self  ondansetron (ZOFRAN-ODT) 8 MG disintegrating tablet [id number] No 90m ODT q4 hours prn nausea  Patient not taking: Reported on 98/67/6720  GDelora Fuel MD Not Taking Active   pantoprazole (PROTONIX)  40 MG tablet [id number] Take 1 tablet (40 mg total) by mouth 2 (two) times daily. Panosh, WStandley Brooking MD Taking Active Self  potassium chloride (KLOR-CON) 10 MEQ tablet [id number] TAKE FOUR TABLETS BY MOUTH DAILY. Panosh, WStandley Brooking MD Taking Active   prednisoLONE acetate (PRED FORTE) 1 % ophthalmic suspension [id number] Place 1 drop into the right eye 4 (four) times daily. [provider] Taking Active   RESTASIS 0.05 % ophthalmic emulsion [id number] 1 drop 2 (two) times daily. [provider] Taking Active   rosuvastatin (CRESTOR) 20 MG tablet [id number] Take 1 tablet (20 mg total) by mouth daily.  Patient taking differently: Take 20 mg by mouth at bedtime.   Nahser, PWonda Cheng MD Taking Active Self  Spacer/Aero-Holding Chambers (AEROCHAMBER PLUS) inhaler [id number] Use as instructed MMelynda Ripple MD Taking Active Child, Self  vitamin B-12 (CYANOCOBALAMIN) 1000 MCG tablet [id number] Take 1,000 mcg by mouth daily. [provider] Taking Active Child, Self  Vitamin D, Cholecalciferol, 25 MCG (1000 UT) TABS [id number] Take 1,000 Units by mouth daily. [provider] Taking Active Self            Patient Active Problem List   Diagnosis Date Noted   Pulmonary hypertension, unspecified ([place] 12/06/2021   Chronic cough 10/18/2021   Chronic rhinitis 11/24/2019   Nausea 11/03/2018   Elevated troponin 11/03/2018   Hyponatremia 11/03/2018   Hypertensive heart disease with hypertensive chronic kidney disease 11/30/2017   Fasting hyperglycemia 11/30/2017   Renal cyst 03/27/2016   CKD (chronic kidney disease) stage 3, GFR 30-59 ml/min (HCC) 08/23/2015   Perceived hearing changes 06/29/2014  Resistant hypertension 06/29/2014   Lumbago 06/15/2014   Pre-diabetes vs early DM  06/15/2014   Dyspnea 04/27/2014   Asthma, chronic 04/13/2014   Elevated uric acid in blood 04/13/2014   Essential hypertension 03/27/2014   History of DVT (deep  vein thrombosis)    Palpitations 01/05/2014   Anemia of chronic disease 2014-01-06   Death of family member 2013/09/10   Leg edema 06/20/2013   Bereavement due to life event 04/21/2013   Other dysphagia 02/11/2013   Colon cancer screening 02/11/2013   Anxiety state 01/20/2013   Leg cramps 01/20/2013   Back pain 12/05/2012   Family hx of colon cancer 10/21/2012   Family hx of lung cancer 10/21/2012   Family hx-breast malignancy 10/21/2012   Renal insufficiency creatinine 1.3 10/21/2012   Anemia, chronic disease 10/21/2012   GERD (gastroesophageal reflux disease) 09/08/2012   HTN (hypertension) 09/08/2012   Hyperlipidemia 09/08/2012   Varicose veins of leg with complications 25/95/6387    Immunization History  Administered Date(s) Administered   Fluad Quad(high Dose 65+) 01/17/2019, 02/16/2020, 01/27/2021, 01/25/2022   Influenza, High Dose Seasonal PF 02/17/2014, 03/01/2015, 02/21/2016, 03/12/2017, 01/31/2018   Influenza,inj,Quad PF,6+ Mos 01/20/2013   Moderna Sars-Covid-2 Vaccination 04/29/2020   PFIZER(Purple Top)SARS-COV-2 Vaccination 06/07/2019, 06/28/2019, 09/10/2020   Pneumococcal Conjugate-13 02/21/2016   Pneumococcal Polysaccharide-23 03/10/2020   Pneumococcal-Unspecified 01/13/2009   Td 04/03/2014   Unspecified SARS-COV-2 Vaccination 04/01/2021   Zoster Recombinat (Shingrix) 02/12/2019, 04/18/2019   Patient just got a BP cuff last week and she thought she used it already but could not find the readings. She thinks her granddaughter moved her BP readings and she couldn't find them.   Patient has been checking BP in the morning and at night sometimes before and after her medications. Patient will bring her new cuff to her appt on 11th to make sure it's accurate.   One of her providers recommended the clonidine to take only if her BP is over 180 and she has only taken it 4 times since getting it. She does get lightheaded with higher readings when she gets in the 170s.  She  is still only taking the carvedilol once a day.    Patient reports she is taking albuterol more often since the weather has gotten colder and uses it almost 3-4 times a week. She uses both the nebulizer and the inhaler.  Conditions to be addressed/monitored:  Hypertension, Hyperlipidemia, GERD, Asthma, Chronic Kidney Disease, Anxiety, Allergic Rhinitis, and Pre-diabetes  Conditions addressed this visit: Hypertension, asthma  Care Plan : CCM Pharmacy Care Plan  Updates made by Viona Gilmore, [place] since 04/14/2022 12:00 AM     Problem: Problem: Hypertension, Hyperlipidemia, GERD, Asthma, Chronic Kidney Disease, Anxiety, Allergic Rhinitis, and Pre-diabetes      Long-Range Goal: Patient-Specific Goal   Start Date: 11/30/2020  Expected End Date: 11/30/2021  Recent Progress: On track  Priority: High  Note:   Current Barriers:  Unable to independently monitor therapeutic efficacy Unable to achieve control of blood pressure  Suboptimal therapeutic regimen for blood pressure  Pharmacist Clinical Goal(s):  Patient will achieve adherence to monitoring guidelines and medication adherence to achieve therapeutic efficacy achieve control of blood pressure as evidenced by home readings and office readings  through collaboration with PharmD and provider.   Interventions: 1:1 collaboration with Panosh, Standley Brooking, MD regarding development and update of comprehensive plan of care as evidenced by provider attestation and co-signature Inter-disciplinary care team collaboration (see longitudinal plan of care) Comprehensive medication review performed; medication  list updated in electronic medical record  Hypertension (BP goal <140/90) -Uncontrolled -Current treatment: Amlodipine 5 mg 1 tablet daily - in AM - Appropriate, Query effective, Safe, Accessible Doxazosin 2 mg 3 tablets at bedtime - in PM - Query Appropriate, Query effective, Query Safe, Accessible Carvedilol 6.25 mg daily - in AM - Query  Appropriate, Query effective, Query Safe, Accessible Hydralazine 50 mg 1.5 tablets three times daily - Appropriate, Query effective, Safe, Accessible Furosemide 40 mg 2 tablets in AM and 1 tablet in PM - Appropriate, Query effective, Safe, Accessible -Medications previously tried: unknown  -Current home readings: 160/65 (average of the last 3), 148/57, 173/70; patient just got a new BP cuff -Current dietary habits: puts a little bit of salt in food - used when cooking  -Current exercise habits: limited -Denies hypotensive/hypertensive symptoms -Educated on BP goals and benefits of medications for prevention of heart attack, stroke and kidney damage; Exercise goal of 150 minutes per week; Importance of home blood pressure monitoring; Proper BP monitoring technique; -Counseled to monitor BP at home twice a week, document, and provide log at future appointments -Counseled on diet and exercise extensively Recommended to continue current medication Recommended switching carvedilol to metoprolol or bisoprolol due to asthma.  Hyperlipidemia: (LDL goal < 70) -Uncontrolled -Current treatment: Rosvuastatin 20 mg 1 tablet daily - Appropriate, Effective, Query Safe, Accessible -Medications previously tried: none  -Current dietary patterns: did not discuss -Current exercise habits: limited with SOB -Educated on Cholesterol goals;  -Recommended switching rosuvastatin to atorvastatin 40 mg given her kidney function.  Diabetes (A1c goal <6.5%) -Controlled -Current medications: No medications -Medications previously tried: none  -Current home glucose readings fasting glucose: does not need to check post prandial glucose: does not need to check -Denies hypoglycemic/hyperglycemic symptoms -Current meal patterns:  breakfast: did not discuss  lunch: did not discuss   dinner: did not discuss  snacks: did not discuss  drinks: did not discuss  -Current exercise: limited due to SOB -Educated on A1c  and blood sugar goals; Carbohydrate counting and/or plate method -Counseled to check feet daily and get yearly eye exams -Counseled on diet and exercise extensively  Asthma (Goal: control symptoms) -Uncontrolled -Current treatment  Breztri 160-9-4.8 mcg/act 2 puffs twice daily - Appropriate, Effective, Safe, Accessible Albuterol HFA as needed - Appropriate, Effective, Safe, Accessible Albuterol nebulizer as needed - Appropriate, Effective, Safe, Accessible Montelukast 10 mg 1 tablet daily - Appropriate, Effective, Safe, Accessible -Medications previously tried: n/a  -Pulmonary function testing: FEV1 66% (11/26/17) -Exacerbations requiring treatment in last 6 months: none -Patient reports consistent use of maintenance inhaler -Frequency of rescue inhaler use: more frequently; every morning -Counseled on Proper inhaler technique; Benefits of consistent maintenance inhaler use When to use rescue inhaler -Recommended to continue current medication Assessed patient finances. Applied for [employer] patient assistance and was approved.  Depression/Anxiety (Goal: minimize symptoms) -Not ideally controlled -Current treatment: Citalopram 20 mg 1 tablet daily - Appropriate, Query effective, Safe, Accessible Lorazepam 1 mg 1 tablet every 8 hours as needed - Appropriate, Effective, Query Safe, Accessible -Medications previously tried/failed: none -PHQ9: 1 -GAD7: 3 -Educated on Benefits of medication for symptom control -Recommended to continue current medication Counseled on long term risks of taking benzodiazepines and limiting use. Recommended switching citalopram to alternative SSRI.  Allergic rhinitis (Goal: minimize symptoms) -Controlled -Current treatment  Loratadine 10 mg 1 tablet daily as needed - Appropriate, Effective, Safe, Accessible Fluticasone 50 mcg/act 2 sprays in nostrils daily - Appropriate, Effective, Safe, Accessible -Medications previously tried:  Dymista  -Recommended to  continue current medication  GERD (Goal: minimize symptoms) -Controlled -Current treatment  Pantoprazole 40 mg 1 tablet twice daily before meals - Appropriate, Effective, Query Safe, Accessible -Medications previously tried: none  -Recommended to continue current medication  History of DVT (Goal: prevent blood clots) -Controlled -Current treatment  Clopidogrel 75 mg 1 tablet daily - Query Appropriate, Effective, Safe, Accessible -Medications previously tried: none  - Reassess the need at follow up.   Health Maintenance -Vaccine gaps: COVID booster -Current therapy:  Potassium chloride 10 mEq 4 tablets daily  Diclofenac gel 1% as needed Ferrous sulfate 325 mg 1 tablet daily with breakfast Vitamin B12 1000 mcg daily Vitamin D 1000 units daily -Educated on Cost vs benefit of each product must be carefully weighed by individual consumer -Patient is satisfied with current therapy and denies issues -Recommended to continue current medication  Patient Goals/Self-Care Activities Patient will:  - take medications as prescribed check blood pressure twice weekly, document, and provide at future appointments target a minimum of 150 minutes of moderate intensity exercise weekly  Follow Up Plan: The care management team will reach out to the patient again over the next 21 days.        Medication Assistance: None required.  Patient affirms current coverage meets needs.  Compliance/Adherence/Medication fill history: Care Gaps: DEXA, foot exam, COVID booster, eye exam, AWV Last BP - 170/64 on 01/25/2022 Last HGA1C - 5.4 on 01/25/2022  Star-Rating Drugs: Rosuvastatin 20 mg - last filled 03/03/2022 90 DS at [facility]   Patient's preferred pharmacy is:  [place], [place] [address] Phone: [phone] Fax: [phone]   Uses pill box? Yes Pt endorses 95% compliance  We discussed:  Benefits of medication synchronization, packaging and delivery as well as enhanced pharmacist oversight with [employer]. Patient decided to: Continue current medication management strategy  Care Plan and Follow Up Patient Decision:  Patient agrees to Care Plan and Follow-up.  Plan: The care management team will reach out to the patient again over the next 14 days.  [name], PharmD, [place] Pharmacist [place] [facility] [place]

## 2022-04-17 ENCOUNTER — Telehealth: Payer: Self-pay | Admitting: Cardiovascular Disease

## 2022-04-17 DIAGNOSIS — I130 Hypertensive heart and chronic kidney disease with heart failure and stage 1 through stage 4 chronic kidney disease, or unspecified chronic kidney disease: Secondary | ICD-10-CM

## 2022-04-17 DIAGNOSIS — Z79899 Other long term (current) drug therapy: Secondary | ICD-10-CM

## 2022-04-17 DIAGNOSIS — E782 Mixed hyperlipidemia: Secondary | ICD-10-CM

## 2022-04-17 NOTE — Telephone Encounter (Signed)
Reached out to patient, no answer, left message asking that she call back to review needed medication changes. Rosuvastatin has been d/c'd in chart. Awaiting to hear from patient, then will order labs and Lipitor.

## 2022-04-17 NOTE — Telephone Encounter (Signed)
-----   Message from Thayer Headings, MD sent at 04/14/2022  4:26 PM EST ----- Regarding: FW: Cholesterol medication question Hi [name],  Maddie ( pharm D) has recommended changing the rosuvastatin to Atorvastatin 40 mg ( better since she has some CKD)   Will you DC rosuva Start Atorva 40 mg a day  Check lipids , ALT in 3 months   Thanks  PN  ----- Message ----- From: Viona Gilmore, [facility] Sent: 04/14/2022   2:23 PM EST To: Thayer Headings, MD Subject: RE: Cholesterol medication question            Would you be able to send that in for her? Her PCP is out on vacation and I am unable to send in prescriptions. I will call her back though to make her aware of the change if so! Thanks! ----- Message ----- From: Thayer Headings, MD Sent: 04/14/2022   2:07 PM EST To: Viona Gilmore, RPH Subject: RE: Cholesterol medication question            Hi Maddie,  I'll go along with your suggestion   Thank you   Phil  ----- Message ----- From: Viona Gilmore, [facility] Sent: 04/14/2022   1:56 PM EST To: Thayer Headings, MD Subject: Cholesterol medication question                Hi,  I just spoke with [name] to discuss her medications and saw that you had switched her to rosuvastatin earlier this year because of her elevated LDL. However, rosuvastatin is not the safest option for her given her kidney function as it is mostly metabolized in the kidneys. What do you think about switching her back to the atorvastatin and just opting for a higher dose than she was on before? She was previously on the 20 mg dose so it could be increased to 40 mg. The atorvastatin is the safest statin for the kidneys.  Let me know what you think! Best, Maddie

## 2022-04-20 ENCOUNTER — Telehealth: Payer: Self-pay | Admitting: Internal Medicine

## 2022-04-20 NOTE — Telephone Encounter (Signed)
Left message for patient to call back and schedule Medicare Annual Wellness Visit (AWV) either virtually or in office. Left  my jabber number [phone]  Awvi 05/15/09 per [facility]  please schedule with Nurse Health Adviser   45 min for awv-i and in office appointments 30 min for awv-s  phone/virtual appointments  

## 2022-04-21 MED ORDER — ATORVASTATIN CALCIUM 40 MG PO TABS
40.0000 mg | ORAL_TABLET | Freq: Every day | ORAL | 3 refills | Status: DC
Start: 2022-04-21 — End: 2023-08-13

## 2022-04-21 NOTE — Telephone Encounter (Signed)
Called and spoke with patient and daughter. She verbalized understanding to stop Rosuvastatin and start Atorvastatin 40mg  daily d/t kidney function. Labs entered and scheduled for 07/21/22

## 2022-04-24 ENCOUNTER — Inpatient Hospital Stay: Payer: Medicare Other

## 2022-04-24 ENCOUNTER — Inpatient Hospital Stay: Payer: Medicare Other | Attending: Hematology

## 2022-04-24 ENCOUNTER — Other Ambulatory Visit: Payer: Self-pay

## 2022-04-24 VITALS — BP 160/56 | HR 60 | Temp 98.5°F | Resp 18

## 2022-04-24 DIAGNOSIS — Z86718 Personal history of other venous thrombosis and embolism: Secondary | ICD-10-CM | POA: Diagnosis not present

## 2022-04-24 DIAGNOSIS — I130 Hypertensive heart and chronic kidney disease with heart failure and stage 1 through stage 4 chronic kidney disease, or unspecified chronic kidney disease: Secondary | ICD-10-CM | POA: Insufficient documentation

## 2022-04-24 DIAGNOSIS — Z79899 Other long term (current) drug therapy: Secondary | ICD-10-CM | POA: Diagnosis not present

## 2022-04-24 DIAGNOSIS — I5032 Chronic diastolic (congestive) heart failure: Secondary | ICD-10-CM | POA: Insufficient documentation

## 2022-04-24 DIAGNOSIS — D631 Anemia in chronic kidney disease: Secondary | ICD-10-CM | POA: Diagnosis not present

## 2022-04-24 DIAGNOSIS — D638 Anemia in other chronic diseases classified elsewhere: Secondary | ICD-10-CM

## 2022-04-24 DIAGNOSIS — N184 Chronic kidney disease, stage 4 (severe): Secondary | ICD-10-CM | POA: Diagnosis not present

## 2022-04-24 LAB — CBC WITH DIFFERENTIAL (CANCER CENTER ONLY)
Abs Immature Granulocytes: 0 10*3/uL (ref 0.00–0.07)
Basophils Absolute: 0 10*3/uL (ref 0.0–0.1)
Basophils Relative: 1 %
Eosinophils Absolute: 0.1 10*3/uL (ref 0.0–0.5)
Eosinophils Relative: 4 %
HCT: 30.7 % — ABNORMAL LOW (ref 36.0–46.0)
Hemoglobin: 10 g/dL — ABNORMAL LOW (ref 12.0–15.0)
Immature Granulocytes: 0 %
Lymphocytes Relative: 36 %
Lymphs Abs: 1.2 10*3/uL (ref 0.7–4.0)
MCH: 29.9 pg (ref 26.0–34.0)
MCHC: 32.6 g/dL (ref 30.0–36.0)
MCV: 91.6 fL (ref 80.0–100.0)
Monocytes Absolute: 0.3 10*3/uL (ref 0.1–1.0)
Monocytes Relative: 10 %
Neutro Abs: 1.7 10*3/uL (ref 1.7–7.7)
Neutrophils Relative %: 49 %
Platelet Count: 189 10*3/uL (ref 150–400)
RBC: 3.35 MIL/uL — ABNORMAL LOW (ref 3.87–5.11)
RDW: 13.5 % (ref 11.5–15.5)
WBC Count: 3.4 10*3/uL — ABNORMAL LOW (ref 4.0–10.5)
nRBC: 0 % (ref 0.0–0.2)

## 2022-04-24 MED ORDER — DARBEPOETIN ALFA 100 MCG/0.5ML IJ SOSY
100.0000 ug | PREFILLED_SYRINGE | Freq: Once | INTRAMUSCULAR | Status: AC
  Administered 2022-04-24: 100 ug via SUBCUTANEOUS
  Filled 2022-04-24: qty 0.5

## 2022-04-24 NOTE — Patient Instructions (Signed)

## 2022-04-24 NOTE — Progress Notes (Signed)
Per Cira Rue NP, okay to proceed with Aranesp with blood pressure of 160/58 and asymptomatic.

## 2022-04-27 ENCOUNTER — Encounter: Payer: Self-pay | Admitting: Family Medicine

## 2022-04-27 ENCOUNTER — Telehealth (INDEPENDENT_AMBULATORY_CARE_PROVIDER_SITE_OTHER): Payer: Medicare Other | Admitting: Family Medicine

## 2022-04-27 VITALS — Ht 60.0 in | Wt 144.4 lb

## 2022-04-27 DIAGNOSIS — Z0000 Encounter for general adult medical examination without abnormal findings: Secondary | ICD-10-CM | POA: Diagnosis not present

## 2022-04-27 DIAGNOSIS — E2839 Other primary ovarian failure: Secondary | ICD-10-CM

## 2022-04-27 NOTE — Progress Notes (Signed)
PATIENT CHECK-IN and HEALTH RISK ASSESSMENT QUESTIONNAIRE:  -completed by phone/video for upcoming Medicare Preventive Visit  Pre-Visit Check-in: 1)Vitals (height, wt, BP, etc) - record in vitals section for visit on day of visit 2)Review and Update Medications, Allergies PMH, Surgeries, Social history in Epic 3)Hospitalizations in the last year with date/reason? Yes, Headaches and blood pressure  4)Review and Update Care Team (patient's specialists) in Epic 5) Complete PHQ9 in Epic  6) Complete Fall Screening in Epic 7)Review all Health Maintenance Due and order under PCP if not done.  8)Medicare Wellness Questionnaire: Answer theses question about your habits: Do you drink alcohol? No If yes, how many drinks do you have a day?N/A Have you ever smoked?No Quit date if applicable? N/A  How many packs a day do/did you smoke? N/A Do you use smokeless tobacco?No Do you use an illicit drugs?No Do you exercises? No IF so, what type and how many days/minutes per week?N/A Are you sexually active? No Number of partners?N/A Typical breakfast- Oatmeal, Grits and Boiled eggs Typical lunch- Salad, soup Typical dinner- Salad, Ribs  Beverages:  drinks lots of water, Coffee, Juice  Answer theses question about you: Can you perform most household chores?Yes Do you find it hard to follow a conversation in a noisy room?No Do you often ask people to speak up or repeat themselves?No Do you feel that you have a problem with memory?No Do you balance your checkbook and or bank acounts?Yes Do you feel safe at home?Yes Last dentist visit? 2 years  Do you need assistance with any of the following: Please note if so   Driving? No  Feeding yourself? No  Getting from bed to chair? No  Getting to the toilet? No  Bathing or showering? No  Dressing yourself? No  Managing money?No  Climbing a flight of stairs Yes  Preparing meals? No  Do you have Advanced Directives in place (Living Will, Healthcare  Power or Attorney)? Yes   Last eye Exam and location? 6 months   Do you currently use prescribed or non-prescribed narcotic or opioid pain medications? No  Do you have a history or close family history of breast, ovarian, tubal or peritoneal cancer or a family member with BRCA (breast cancer susceptibility 1 and 2) gene mutations? Yes  Nurse/Assistant Credentials/time stamp:   ----------------------------------------------------------------------------------------------------------------------------------------------------------------------------------------------------------------------   MEDICARE ANNUAL PREVENTIVE VISIT WITH PROVIDER: (Welcome to [employer], initial annual wellness or annual wellness exam)  Virtual Visit via Video Note  I connected with Zooey  on 04/27/22 by a video enabled telemedicine application and verified that I am speaking with the correct person using two identifiers.  Location patient: home Location provider:work or home office Persons participating in the virtual visit: patient, provider, family member  Concerns and/or follow up today: none other than knee has been bothering her. No reported falls or injury that she is aware of. Denies popping or catching. Reports pain and some swelling without redness or warmth. Usually pretty active around the house, but since knee pain the last week has been less active.    See HM section in Epic for other details of completed HM.    ROS: negative for report of fevers, unintentional weight loss, vision changes, vision loss, hearing loss or change, chest pain, sob, hemoptysis, melena, hematochezia, hematuria, genital discharge or lesions, falls, bleeding or bruising, loc, thoughts of suicide or self harm, memory loss  Patient-completed extensive health risk assessment - reviewed and discussed with the patient: See Health Risk Assessment completed with patient prior to  the visit either above or in recent phone note.  This was reviewed in detailed with the patient today and appropriate recommendations, orders and referrals were placed as needed per Summary below and patient instructions.   Review of Medical History: -PMH, [place], Family History and current specialty and care providers reviewed and updated and listed below   Patient Care Team: Panosh, Standley Brooking, MD as PCP - General (Internal Medicine) Nahser, Wonda Cheng, MD as PCP - Cardiology (Cardiology) Marlou Sa, Tonna Corner, MD (Orthopedic Surgery) Truitt Merle, MD as Consulting Physician (Hematology) Madelon Lips, MD as Consulting Physician (Nephrology) Monna Fam, MD as Consulting Physician (Ophthalmology) Viona Gilmore, [facility] as Pharmacist (Pharmacist) Tobi Bastos, RN as [facility] Feliz Beam, MD as Referring Physician (Ophthalmology) Collene Gobble, MD as Consulting Physician (Pulmonary Disease)   Past Medical History:  Diagnosis Date   Acute superficial venous thrombosis of left lower extremity 03/27/2014   concern about hx and potential extensive on exam tenderness calf and low dose lovenox 40 qd 2-4 weekselevetion and close  fu advised .    Allergy    Anemia    Anxiety    Arthritis    "shoulders" (09/09/2012)   Asthma 09/08/2012   Carotid bruit    [place] 2018  low risk 1 - 39%   Cataract    bil cateracts removed   Chronic kidney disease    Clotting disorder (HCC)    blood clots in legs   Diabetes mellitus without complication ([place])    "Borderline" per pt; being monitored.  no meds per pt   Diverticulosis    Dyspnea 04/27/2014   GERD (gastroesophageal reflux disease)    Headache(784.0)    "related to my high blood pressure" (09/09/2012)   Hiatal hernia    History of DVT (deep vein thrombosis)    "RLE" (09/09/2012) coumadine cant take asa so on plavix    HTN (hypertension) 09/08/2012   Hyperlipidemia    Lupus ([place])    "cured years ago" (09/09/2012)   Other dysphagia 02/11/2013   Syncope and  collapse 01/21/2018   Varicose veins    Varicose veins of leg with complications 28/00/3491    Past Surgical History:  Procedure Laterality Date   ABDOMINAL HYSTERECTOMY     partial   BREAST EXCISIONAL BIOPSY Right    BREAST SURGERY     CARPAL TUNNEL RELEASE Right 2000's   CARPAL TUNNEL RELEASE Bilateral 10/21/2013   Procedure: BILATERAL  CARPAL TUNNEL RELEASE;  Surgeon: [name], MD;  Location: [facility];  Service: Orthopedics;  Laterality: Bilateral;   COLONOSCOPY     SHOULDER OPEN ROTATOR CUFF REPAIR Bilateral 2000's   TRIGGER FINGER RELEASE Right 10/21/2013   Procedure: RELEASE TRIGGER FINGER/A-1 PULLEY RIGHT MIDDLE AND RIGHT RING;  Surgeon: [name], MD;  Location: [facility];  Service: Orthopedics;  Laterality: Right;   UPPER GASTROINTESTINAL ENDOSCOPY      Social History   Socioeconomic History   Marital status: Single    Spouse name: Not on file   Number of children: 6   Years of education: Not on file   Highest education level: Not on file  Occupational History   Occupation: retired  Tobacco Use   Smoking status: Never   Smokeless tobacco: Never  Vaping Use   Vaping Use: Never used  Substance and Sexual Activity   Alcohol use: No   Drug use: No   Sexual activity: Not Currently  Birth control/protection: Surgical  Other Topics Concern   Not on file  Social History Narrative   2 people living in the home.  Grand daughter.   Up and down through the night      Had 7 children  2 deceased . 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    works 3 days per week currently .   Glasses dentures  Neg tad    Social Determinants of Health   Financial Resource Strain: Low Risk  (04/14/2022)   Overall Financial Resource Strain (CARDIA)    Difficulty of Paying Living Expenses: Not very hard  Food Insecurity: Not on file  Transportation Needs: No  Transportation Needs (01/01/2020)   PRAPARE - Hydrologist (Medical): No    Lack of Transportation (Non-Medical): No  Physical Activity: Not on file  Stress: Not on file  Social Connections: Not on file  Intimate Partner Violence: Not on file    Family History  Problem Relation Age of Onset   Hypertension Mother    Lung cancer Mother    Hypertension Father    Lung cancer Father    Breast cancer Sister    Colon cancer Sister        ? 44' s dx - died in 80's   Breast cancer Sister    Hypertension Brother    Rectal cancer Brother    Stomach cancer Brother    Breast cancer Brother    Heart attack Daughter    Esophageal cancer Daughter    Hypertension Daughter    Diabetes Daughter    Breast cancer Paternal Aunt    Lung cancer Other        both parents    Pancreatic cancer Neg Hx     Current Outpatient Medications on File Prior to Visit  Medication Sig Dispense Refill   acetaminophen (TYLENOL) 500 MG tablet Take 500 mg by mouth in the morning and at bedtime.     albuterol (PROVENTIL) (2.5 MG/3ML) 0.083% nebulizer solution Take 3 mLs (2.5 mg total) by nebulization every 6 (six) hours as needed for shortness of breath or wheezing. 360 mL 6   albuterol (VENTOLIN HFA) 108 (90 Base) MCG/ACT inhaler USE 2 PUFFS EVERY 6 HOURS AS NEEDED FOR WHEEZING. 8.5 g 3   amLODipine (NORVASC) 5 MG tablet Take 1 tablet (5 mg total) by mouth daily. 90 tablet 2   atorvastatin (LIPITOR) 40 MG tablet Take 1 tablet (40 mg total) by mouth daily. 90 tablet 3   azaTHIOprine (IMURAN) 50 MG tablet Take 25 mg by mouth at bedtime.     Azelastine-Fluticasone 137-50 MCG/ACT SUSP PLACE ONE SPRAY IN EACH NOSTRIL TWICE DAILY (IN THE MORNING AND AT BEDTIME) 23 g 6   Budeson-Glycopyrrol-Formoterol (BREZTRI AEROSPHERE) 160-9-4.8 MCG/ACT AERO INHALE 2 PUFFS INTO THE LUNGS TWICE DAILY, IN THE MORNING & AT BEDTIME. 10.7 g 11   carvedilol (COREG) 6.25 MG tablet Take 6.25 mg by mouth daily.      cetirizine (ZYRTEC) 10 MG tablet Take 10 mg by mouth daily.     citalopram (CELEXA) 20 MG tablet Take 1 tablet (20 mg total) by mouth every evening. 90 tablet 1   cloNIDine (CATAPRES) 0.1 MG tablet Take 1 tablet every 12 hours as needed for blood pressures greater than 491 systolic (Patient taking differently: Take 0.1 mg by mouth daily.) 15 tablet 0  clopidogrel (PLAVIX) 75 MG tablet TAKE ONE TABLET BY MOUTH ONCE DAILY. 30 tablet 1   diclofenac Sodium (VOLTAREN) 1 % GEL Apply 4 g topically 4 (four) times daily. (Patient taking differently: Apply 4 g topically 2 (two) times daily as needed (painful areas).) 100 g 6   doxazosin (CARDURA) 2 MG tablet Take 3 tablets (6 mg total) by mouth at bedtime. 270 tablet 1   ferrous sulfate 325 (65 FE) MG EC tablet Take 1 tablet (325 mg total) by mouth daily with breakfast. 30 tablet 3   fluticasone (FLONASE) 50 MCG/ACT nasal spray Place 2 sprays into both nostrils daily. 16 g 2   furosemide (LASIX) 40 MG tablet Take 40-80 mg by mouth See admin instructions. 80 mg in the am and 40 mg in the pm     hydrALAZINE (APRESOLINE) 50 MG tablet Take 1&1/2 tablets (75 mg total) by mouth 3 (three) times daily. 405 tablet 1   LORazepam (ATIVAN) 0.5 MG tablet Take 1 tablet (0.5 mg total) by mouth 2 (two) times daily as needed for anxiety. 30 tablet 0   montelukast (SINGULAIR) 10 MG tablet TAKE ONE TABLET BY MOUTH ONCE DAILY 90 tablet 0   ondansetron (ZOFRAN-ODT) 8 MG disintegrating tablet 69m ODT q4 hours prn nausea 20 tablet 0   pantoprazole (PROTONIX) 40 MG tablet Take 1 tablet (40 mg total) by mouth 2 (two) times daily. 180 tablet 1   potassium chloride (KLOR-CON) 10 MEQ tablet TAKE FOUR TABLETS BY MOUTH DAILY. 120 tablet 3   prednisoLONE acetate (PRED FORTE) 1 % ophthalmic suspension Place 1 drop into the right eye 4 (four) times daily.     RESTASIS 0.05 % ophthalmic emulsion 1 drop 2 (two) times daily.     Spacer/Aero-Holding Chambers (AEROCHAMBER PLUS) inhaler Use as  instructed 1 each 2   vitamin B-12 (CYANOCOBALAMIN) 1000 MCG tablet Take 1,000 mcg by mouth daily.     Vitamin D, Cholecalciferol, 25 MCG (1000 UT) TABS Take 1,000 Units by mouth daily.     No current facility-administered medications on file prior to visit.    Allergies  Allergen Reactions   Penicillins Nausea And Vomiting and Other (See Comments)    Has patient had a PCN reaction causing immediate rash, facial/tongue/throat swelling, SOB or lightheadedness with hypotension: Y Has patient had a PCN reaction causing severe rash involving mucus membranes or skin necrosis: Y Has patient had a PCN reaction that required hospitalization: N Has patient had a PCN reaction occurring within the last 10 years: N If all of the above answers are "NO", then may proceed with Cephalosporin use.    Aspirin Other (See Comments)    Wheezing Acetaminophen is OK       Physical Exam There were no vitals filed for this visit. Estimated body mass index is 28.2 kg/m as calculated from the following:   Height as of this encounter: 5' (1.524 m).   Weight as of this encounter: 144 lb 6.4 oz (65.5 kg).  EKG (optional): deferred due to virtual visit  GENERAL: alert, oriented, appears well and in no acute distress; visual acuity grossly intact, full vision exam deferred due to pandemic and/or virtual encounter  HEENT: atraumatic, conjunttiva clear, no obvious abnormalities on inspection of external nose and ears  NECK: normal movements of the head and neck  LUNGS: on inspection no signs of respiratory distress, breathing rate appears normal, no obvious gross SOB, gasping or wheezing  CV: no obvious cyanosis  PSYCH/NEURO: pleasant and cooperative, no  obvious depression or anxiety, speech and thought processing grossly intact, Cognitive function grossly intact  Flowsheet Row Video Visit from 04/27/2022 in [facility]  PHQ-9 Total Score 1           04/27/2022    4:02 PM  09/26/2021   10:42 AM 07/21/2020   11:13 AM 05/21/2019   10:45 AM 03/25/2018   10:34 AM  Depression screen PHQ 2/9  Decreased Interest 0 1 0 0 0  Down, Depressed, Hopeless 0 0 0 0 0  PHQ - 2 Score 0 1 0 0 0  Altered sleeping 0 0 0    Tired, decreased energy 1 0 1    Change in appetite 0 0 0    Feeling bad or failure about yourself  0 0 0    Trouble concentrating 0 0 0    Moving slowly or fidgety/restless 0 0 0    Suicidal thoughts 0 0 0    PHQ-9 Score _0 Difficult doing work/chores Not difficult at all Not difficult at all Not difficult at all         06/29/2021   11:33 AM 09/09/2021    6:16 PM 09/12/2021   12:26 AM 09/26/2021   10:42 AM 04/27/2022    4:04 PM  Fall Risk  Falls in the past year? 0   0 0  Was there an injury with Fall? 0   0 0  Fall Risk Category Calculator 0   0 0  Fall Risk Category Low   Low Low  Patient Fall Risk Level _1   Patient at Risk for Falls Due to No Fall Risks   No Fall Risks No Fall Risks  Fall risk Follow up Falls evaluation completed   Falls evaluation completed Falls evaluation completed     SUMMARY AND PLAN:  Medicare annual wellness visit, subsequent  Estrogen deficiency - Plan: DG Bone Density   Discussed applicable health maintenance/preventive health measures and advised and referred or ordered per patient preferences:  Health Maintenance  Topic Date Due   FOOT EXAM  Never done, she plans to do with PCP at follow up   DEXA SCAN  Never done, she is requesting that I order this as a nurse came to her house at one point and told her she needed to get this. Discussed implications, what would happen if abnormal. She and caregiver want to order this, placed orders. Advised if abnormal would need to do visit with PCP to address. They agree. Advised diet, exercise and vit D/cal options for strong bones as well.    OPHTHALMOLOGY EXAM  06/10/2021, she reports she see optho on a  regular basis. Advised to request diabetic eye exam.    COVID-19 Vaccine (6 - 2023-24 season) 01/13/2022   HEMOGLOBIN A1C  07/26/2022   Medicare Annual Wellness (AWV)  04/28/2023   DTaP/Tdap/Td (2 - Tdap) 04/03/2024   Pneumonia Vaccine 70+ Years old  Completed   INFLUENZA VACCINE  Completed   Zoster Vaccines- Shingrix  Completed   HPV VACCINES  Aged Out    Education and counseling on the following was provided based on the above review of health and a plan/checklist for the patient, along with additional information discussed, was provided for the patient in the patient instructions :   -Advised and counseled on a whole foods based healthy diet and regular exercise: discussed a heart healthy  whole foods based diet at length. A summary of a healthy diet was provided in the Patient Instructions. - Recommended regular exercise and discussed options within the community. First though, she will need inperson evaluation of the knee. Discussed options for evaluation with PCP office or with ortho urgent care. Caregiver agrees to take her for eval. Advised ice, elevation, tylenol in interim if needed.  -Advised yearly dental visits at minimum and regular eye exams   Follow up: see patient instructions     Patient Instructions  I really enjoyed getting to talk with you today! I am available on Tuesdays and Thursdays for virtual visits if you have any questions or concerns, or if I can be of any further assistance.   CHECKLIST FROM ANNUAL WELLNESS VISIT:  -Follow up (please call to schedule if not scheduled after visit):  -Inperson visit with your Primary Doctor office or urgent care to evaluate the knee - call in the morning to schedule -yearly for annual wellness visit with primary care office  Here is a list of your preventive care/health maintenance measures and the plan for each if any are due:  Health Maintenance  Topic Date Due   Medicare Annual Wellness (AWV)  Done today, due in 1  year   FOOT EXAM  Can due with next inperson visit with your doctor   DEXA SCAN  Ordered per your wishes. Please contact your doctor office if not called about this appointment in the next 1 week. If abnormal, please schedule appointment with your doctor to discuss.    OPHTHALMOLOGY EXAM  06/10/2021, please schedule diabetic eye exam with your eye doctor if not done.    COVID-19 Vaccine (6 - 2023-24 season) You can get this vaccine at the pharamcy.    HEMOGLOBIN A1C  07/26/2022   DTaP/Tdap/Td (2 - Tdap) 04/03/2024   Pneumonia Vaccine 43+ Years old  Completed   INFLUENZA VACCINE  Completed   Zoster Vaccines- Shingrix  Completed   HPV VACCINES  Aged Out    -See a dentist at least yearly  -Get your eyes checked and then per your eye specialist's recommendations  -Other issues addressed today: -Please schedule appointment with your doctor or urgent care to evaluate the knee  -I have included below further information regarding a healthy whole foods based diet, physical activity guidelines for adults, stress management and opportunities for social connections. I hope you find this information useful.   -----------------------------------------------------------------------------------------------------------------------------------------------------------------------------------------------------------------------------------------------------------  NUTRITION: -eat real food: lots of colorful vegetables (half the plate) and fruits -5-7 servings of vegetables and fruits per day (fresh or steamed is best), exp. 2 servings of vegetables with lunch and dinner and 2 servings of fruit per day. Berries and greens such as kale and collards are great choices.  -consume on a regular basis: whole grains (make sure first ingredient on label contains the word "whole"), fresh fruits, fish, nuts, seeds, healthy oils (such as olive oil, avocado oil, grape seed oil) -may eat small amounts of dairy and lean  meat on occasion, but avoid processed meats such as ham, bacon, lunch meat, etc. -drink water -try to avoid fast food and pre-packaged foods, processed meat -most experts advise limiting sodium to < 2363m per day, should limit further is any chronic conditions such as high blood pressure, heart disease, diabetes, etc. The American Heart Association advised that < 15055mis is ideal -try to avoid foods that contain any ingredients with names you do not recognize  -try to avoid sugar/sweets (except for  the natural sugar that occurs in fresh fruit) -try to avoid sweet drinks -try to avoid white rice, white bread, pasta (unless whole grain), white or yellow potatoes  EXERCISE GUIDELINES FOR ADULTS: -if you wish to increase your physical activity, do so gradually and with the approval of your doctor -STOP and seek medical care immediately if you have any chest pain, chest discomfort or trouble breathing when starting or increasing exercise  -move and stretch your body, legs, feet and arms when sitting for long periods -Physical activity guidelines for optimal health in adults: -least 150 minutes per week of aerobic exercise (can talk, but not sing) once approved by your doctor, 20-30 minutes of sustained activity or two 10 minute episodes of sustained activity every day.  -resistance training at least 2 days per week if approved by your doctor -balance exercises 3+ days per week:   Stand somewhere where you have something sturdy to hold onto if you lose balance.    1) lift up on toes, start with 5x per day and work up to 20x   2) stand and lift on leg straight out to the side so that foot is a few inches of the floor, start with 5x each side and work up to 20x each side   3) stand on one foot, start with 5 seconds each side and work up to 20 seconds on each side  If you need ideas or help with getting more active:  -Silver sneakers https://tools.silversneakers.com  -Walk with a  Doc: http://stephens-thompson.biz/  -try to include resistance (weight lifting/strength building) and balance exercises twice per week: or the following link for ideas: ChessContest.fr  UpdateClothing.com.cy  STRESS MANAGEMENT: -can try meditating, or just sitting quietly with deep breathing while intentionally relaxing all parts of your body for 5 minutes daily -if you need further help with stress, anxiety or depression please follow up with your primary doctor or contact the wonderful folks at [facility]: [place]: -options in [place] if you wish to engage in more social and exercise related activities:  -Silver sneakers https://tools.silversneakers.com  -Walk with a Doc: http://stephens-thompson.biz/  -Check out the [place] 50+ section on the [place] [employer] (hiking clubs, book clubs, cards and games, chess, exercise classes, aquatic classes and much more) - see the website for details: https://www.-[place].gov/departments/parks-recreation/active-adults50  -YouTube has lots of exercise videos for different ages and abilities as well  -[place] (a variety of indoor and outdoor inperson activities for adults). 2760233054. [address]  -Virtual Online Classes (a variety of topics): see seniorplanet.org or call [phone]  -consider volunteering at a school, hospice center, church, senior center or elsewhere           Lucretia Kern, DO

## 2022-04-27 NOTE — Patient Instructions (Addendum)
I really enjoyed getting to talk with you today! I am available on Tuesdays and Thursdays for virtual visits if you have any questions or concerns, or if I can be of any further assistance.   CHECKLIST FROM ANNUAL WELLNESS VISIT:  -Follow up (please call to schedule if not scheduled after visit):  -Inperson visit with your Primary Doctor office or urgent care to evaluate the knee - call in the morning to schedule -yearly for annual wellness visit with primary care office  Here is a list of your preventive care/health maintenance measures and the plan for each if any are due:  Health Maintenance  Topic Date Due   Medicare Annual Wellness (AWV)  Done today, due in 1 year   FOOT EXAM  Can due with next inperson visit with your doctor   DEXA SCAN  Ordered per your wishes. Please contact your doctor office if not called about this appointment in the next 1 week. If abnormal, please schedule appointment with your doctor to discuss.    OPHTHALMOLOGY EXAM  06/10/2021, please schedule diabetic eye exam with your eye doctor if not done.    COVID-19 Vaccine (6 - 2023-24 season) You can get this vaccine at the pharamcy.    HEMOGLOBIN A1C  07/26/2022   DTaP/Tdap/Td (2 - Tdap) 04/03/2024   Pneumonia Vaccine 61+ Years old  Completed   INFLUENZA VACCINE  Completed   Zoster Vaccines- Shingrix  Completed   HPV VACCINES  Aged Out    -See a dentist at least yearly  -Get your eyes checked and then per your eye specialist's recommendations  -Other issues addressed today: -Please schedule appointment with your doctor or urgent care to evaluate the knee  -I have included below further information regarding a healthy whole foods based diet, physical activity guidelines for adults, stress management and opportunities for social connections. I hope you find this information useful.      NUTRITION: -eat real food: lots of colorful vegetables (half the plate) and fruits -5-7 servings of vegetables and fruits per day (fresh or steamed is best), exp. 2 servings of vegetables with lunch and dinner and 2 servings of fruit per day. Berries and greens such as kale and collards are great choices.  -consume on a regular basis: whole grains (make sure first ingredient on label contains the word "whole"), fresh fruits, fish, nuts, seeds, healthy oils (such as olive oil, avocado oil, grape seed oil) -may eat small amounts of dairy and lean meat on occasion, but avoid processed meats such as ham, bacon, lunch meat, etc. -drink water -try to avoid fast food and pre-packaged foods, processed meat -most experts advise limiting sodium to < 2300mg  per day, should limit further is any chronic conditions such as high blood pressure, heart disease, diabetes, etc. The American Heart Association advised that < 1500mg  is is ideal -try to avoid foods that contain any ingredients with names you do not recognize  -try to avoid sugar/sweets (except for the natural sugar that occurs in fresh fruit) -try to avoid sweet drinks -try to avoid white rice, white bread, pasta (unless whole grain), white or yellow potatoes  EXERCISE GUIDELINES FOR ADULTS: -if you wish to increase your physical activity, do so gradually and with the approval of your doctor -STOP and seek medical care immediately if you have any chest pain, chest discomfort or trouble breathing when starting or increasing exercise  -move and stretch your body, legs, feet and arms when sitting for long periods -Physical activity guidelines  for optimal health in adults: -least 150 minutes per week of  aerobic exercise (can talk, but not sing) once approved by your doctor, 20-30 minutes of sustained activity or two 10 minute episodes of sustained activity every day.  -resistance training at least 2 days per week if approved by your doctor -balance exercises 3+ days per week:   Stand somewhere where you have something sturdy to hold onto if you lose balance.    1) lift up on toes, start with 5x per day and work up to 20x   2) stand and lift on leg straight out to the side so that foot is a few inches of the floor, start with 5x each side and work up to 20x each side   3) stand on one foot, start with 5 seconds each side and work up to 20 seconds on each side  If you need ideas or help with getting more active:  -Silver sneakers https://tools.silversneakers.com  -Walk with a Doc: http://stephens-thompson.biz/  -try to include resistance (weight lifting/strength building) and balance exercises twice per week: or the following link for ideas: ChessContest.fr  UpdateClothing.com.cy  STRESS MANAGEMENT: -can try meditating, or just sitting quietly with deep breathing while intentionally relaxing all parts of your body for 5 minutes daily -if you need further help with stress, anxiety or depression please follow up with your primary doctor or contact the wonderful folks at [place]: [place]: -options in [place] if you wish to engage in more social and exercise related activities:  -Silver sneakers https://tools.silversneakers.com  -Walk with a Doc: http://stephens-thompson.biz/  -Check out the [place] 50+ section on the [place] [employer] (hiking clubs, book clubs, cards and games, chess, exercise classes, aquatic classes and much more) - see the website for  details: https://www.[place].gov/departments/parks-recreation/active-adults50  -YouTube has lots of exercise videos for different ages and abilities as well  -[employer] (a variety of indoor and outdoor inperson activities for adults). 7853147969. [address]  -Virtual Online Classes (a variety of topics): see seniorplanet.org or call 641-027-0344  -consider volunteering at a school, hospice center, church, senior center or elsewhere

## 2022-05-02 ENCOUNTER — Ambulatory Visit (INDEPENDENT_AMBULATORY_CARE_PROVIDER_SITE_OTHER): Payer: Medicare Other

## 2022-05-02 ENCOUNTER — Encounter (HOSPITAL_COMMUNITY): Payer: Self-pay | Admitting: *Deleted

## 2022-05-02 ENCOUNTER — Ambulatory Visit (HOSPITAL_COMMUNITY)
Admission: EM | Admit: 2022-05-02 | Discharge: 2022-05-02 | Disposition: A | Discharge status: Home / Self Care | Payer: Medicare Other | Attending: Physician Assistant | Admitting: Physician Assistant

## 2022-05-02 DIAGNOSIS — G8929 Other chronic pain: Secondary | ICD-10-CM | POA: Diagnosis not present

## 2022-05-02 DIAGNOSIS — M25561 Pain in right knee: Secondary | ICD-10-CM | POA: Diagnosis not present

## 2022-05-02 DIAGNOSIS — M1711 Unilateral primary osteoarthritis, right knee: Secondary | ICD-10-CM | POA: Diagnosis not present

## 2022-05-02 MED ORDER — DICLOFENAC SODIUM 1 % EX GEL
4.0000 g | Freq: Two times a day (BID) | CUTANEOUS | 1 refills | Status: DC | PRN
Start: 2022-05-02 — End: 2023-09-22

## 2022-05-02 NOTE — ED Triage Notes (Signed)
Pts daughter states pt has right knee pain for months but it has got worse lately. She is taking tylenol.

## 2022-05-02 NOTE — Discharge Instructions (Addendum)
   Advised to rub on the prescription strength arthritis cream twice daily to the areas of the knee that hurts to give additional relief.  Advised follow-up PCP or return to urgent care as needed.

## 2022-05-02 NOTE — ED Provider Notes (Signed)
[facility]    CSN: [id number] Arrival date & time: 05/02/22  0160      History   Chief Complaint Chief Complaint  Patient presents with   Knee Pain    HPI [name] is a 88 y.o. female.   57-year-old female presents with right knee pain.  Patient indicates for the past several weeks that she has been having increasing right knee pain and discomfort with mild swelling.  Patient indicates that the knee hurts worse when she stands up on it and tries to walk.  Patient indicates she has been taking Tylenol without relief.  She also indicates she has been using some OTC pain relief cream which tends to not give much benefit.  Patient indicates that she has not hit the knee, following on the knee, or traumatized the knee.  Patient indicates she is concerned because it continues to give her considerable discomfort when she tries to bear weight on the knee.  Patient is not able to tolerate nonsteroidals because it causes her to wheeze.   Knee Pain   Past Medical History:  Diagnosis Date   Acute superficial venous thrombosis of left lower extremity 03/27/2014   concern about hx and potential extensive on exam tenderness calf and low dose lovenox 40 qd 2-4 weekselevetion and close  fu advised .    Allergy    Anemia    Anxiety    Arthritis    "shoulders" (09/09/2012)   Asthma 09/08/2012   Carotid bruit    [place] 2018  low risk 1 - 39%   Cataract    bil cateracts removed   Chronic kidney disease    Clotting disorder (HCC)    blood clots in legs   Diabetes mellitus without complication ([place])    "Borderline" per pt; being monitored.  no meds per pt   Diverticulosis    Dyspnea 04/27/2014   GERD (gastroesophageal reflux disease)    Headache(784.0)    "related to my high blood pressure" (09/09/2012)   Hiatal hernia    History of DVT (deep vein thrombosis)    "RLE" (09/09/2012) coumadine cant take asa so on plavix    HTN (hypertension) 09/08/2012   Hyperlipidemia     Lupus ([place])    "cured years ago" (09/09/2012)   Other dysphagia 02/11/2013   Syncope and collapse 01/21/2018   Varicose veins    Varicose veins of leg with complications 10/93/2355    Patient Active Problem List   Diagnosis Date Noted   Pulmonary hypertension, unspecified ([place]) 12/06/2021   Chronic cough 10/18/2021   Chronic rhinitis 11/24/2019   Nausea 11/03/2018   Elevated troponin 11/03/2018   Hyponatremia 11/03/2018   Hypertensive heart disease with hypertensive chronic kidney disease 11/30/2017   Fasting hyperglycemia 11/30/2017   Renal cyst 03/27/2016   CKD (chronic kidney disease) stage 3, GFR 30-59 ml/min (HCC) 08/23/2015   Perceived hearing changes 06/29/2014   Resistant hypertension 06/29/2014   Lumbago 06/15/2014   Pre-diabetes vs early DM  06/15/2014   Dyspnea 04/27/2014   Asthma, chronic 04/13/2014   Elevated uric acid in blood 04/13/2014   Essential hypertension 03/27/2014   History of DVT (deep vein thrombosis)    Palpitations 01/05/2014   Anemia of chronic disease December 24, 2013   Death of family member 08/28/13   Leg edema 06/20/2013   Bereavement due to life event 04/21/2013   Other dysphagia 02/11/2013   Colon cancer screening 02/11/2013   Anxiety state 01/20/2013   Leg cramps 01/20/2013  Back pain 12/05/2012   Family hx of colon cancer 10/21/2012   Family hx of lung cancer 10/21/2012   Family hx-breast malignancy 10/21/2012   Renal insufficiency creatinine 1.3 10/21/2012   Anemia, chronic disease 10/21/2012   GERD (gastroesophageal reflux disease) 09/08/2012   HTN (hypertension) 09/08/2012   Hyperlipidemia 09/08/2012   Varicose veins of leg with complications 83/15/1761    Past Surgical History:  Procedure Laterality Date   ABDOMINAL HYSTERECTOMY     partial   BREAST EXCISIONAL BIOPSY Right    BREAST SURGERY     CARPAL TUNNEL RELEASE Right 2000's   CARPAL TUNNEL RELEASE Bilateral 10/21/2013   Procedure: BILATERAL  CARPAL TUNNEL RELEASE;   Surgeon: [name], MD;  Location: [facility];  Service: Orthopedics;  Laterality: Bilateral;   COLONOSCOPY     SHOULDER OPEN ROTATOR CUFF REPAIR Bilateral 2000's   TRIGGER FINGER RELEASE Right 10/21/2013   Procedure: RELEASE TRIGGER FINGER/A-1 PULLEY RIGHT MIDDLE AND RIGHT RING;  Surgeon: [name], MD;  Location: [facility];  Service: Orthopedics;  Laterality: Right;   UPPER GASTROINTESTINAL ENDOSCOPY      OB History     Gravida  7   Para  7   Term      Preterm      AB      Living         SAB      IAB      Ectopic      Multiple      Live Births           Obstetric Comments  Lost 2 children soon after birth          Home Medications    Prior to Admission medications   Medication Sig Start Date End Date Taking? Authorizing Provider  acetaminophen (TYLENOL) 500 MG tablet Take 500 mg by mouth in the morning and at bedtime.   Yes [provider]  albuterol (PROVENTIL) (2.5 MG/3ML) 0.083% nebulizer solution Take 3 mLs (2.5 mg total) by nebulization every 6 (six) hours as needed for shortness of breath or wheezing. 10/18/21  Yes Collene Gobble, MD  albuterol (VENTOLIN HFA) 108 (90 Base) MCG/ACT inhaler USE 2 PUFFS EVERY 6 HOURS AS NEEDED FOR WHEEZING. 09/26/21  Yes Collene Gobble, MD  amLODipine (NORVASC) 5 MG tablet Take 1 tablet (5 mg total) by mouth daily. 03/07/22  Yes Nahser, Wonda Cheng, MD  atorvastatin (LIPITOR) 40 MG tablet Take 1 tablet (40 mg total) by mouth daily. 04/21/22  Yes Nahser, Wonda Cheng, MD  azaTHIOprine (IMURAN) 50 MG tablet Take 25 mg by mouth at bedtime. 04/09/21  Yes [provider]  Azelastine-Fluticasone 137-50 MCG/ACT SUSP PLACE ONE SPRAY IN EACH NOSTRIL TWICE DAILY (IN THE MORNING AND AT BEDTIME) 01/19/22  Yes Byrum, Rose Fillers, MD  Budeson-Glycopyrrol-Formoterol (BREZTRI AEROSPHERE) 160-9-4.8 MCG/ACT AERO INHALE 2 PUFFS INTO THE LUNGS TWICE DAILY, IN THE MORNING & AT BEDTIME. 01/26/22  Yes  Byrum, Rose Fillers, MD  carvedilol (COREG) 6.25 MG tablet Take 6.25 mg by mouth daily.   Yes [provider]  cetirizine (ZYRTEC) 10 MG tablet Take 10 mg by mouth daily.   Yes [provider]  cloNIDine (CATAPRES) 0.1 MG tablet Take 1 tablet every 12 hours as needed for blood pressures greater than 607 systolic Patient taking differently: Take 0.1 mg by mouth daily. 09/10/21  Yes Veryl Speak, MD  clopidogrel (PLAVIX) 75 MG tablet TAKE ONE TABLET BY MOUTH ONCE  DAILY. 02/03/22  Yes Panosh, Standley Brooking, MD  doxazosin (CARDURA) 2 MG tablet Take 3 tablets (6 mg total) by mouth at bedtime. 02/20/22  Yes Panosh, Standley Brooking, MD  ferrous sulfate 325 (65 FE) MG EC tablet Take 1 tablet (325 mg total) by mouth daily with breakfast. 12/15/14  Yes Panosh, Standley Brooking, MD  fluticasone (FLONASE) 50 MCG/ACT nasal spray Place 2 sprays into both nostrils daily. 10/20/19  Yes Collene Gobble, MD  furosemide (LASIX) 40 MG tablet Take 40-80 mg by mouth See admin instructions. 80 mg in the am and 40 mg in the pm   Yes [provider]  hydrALAZINE (APRESOLINE) 50 MG tablet Take 1&1/2 tablets (75 mg total) by mouth 3 (three) times daily. 04/12/22  Yes Nahser, Wonda Cheng, MD  LORazepam (ATIVAN) 0.5 MG tablet Take 1 tablet (0.5 mg total) by mouth 2 (two) times daily as needed for anxiety. 01/25/22  Yes Panosh, Standley Brooking, MD  montelukast (SINGULAIR) 10 MG tablet TAKE ONE TABLET BY MOUTH ONCE DAILY 04/11/22  Yes Panosh, Standley Brooking, MD  ondansetron (ZOFRAN-ODT) 8 MG disintegrating tablet 8mg  ODT q4 hours prn nausea 4/0/34  Yes Delora Fuel, MD  pantoprazole (PROTONIX) 40 MG tablet Take 1 tablet (40 mg total) by mouth 2 (two) times daily. 02/20/22  Yes Panosh, Standley Brooking, MD  potassium chloride (KLOR-CON) 10 MEQ tablet TAKE FOUR TABLETS BY MOUTH DAILY. 03/22/22  Yes Panosh, Standley Brooking, MD  prednisoLONE acetate (PRED FORTE) 1 % ophthalmic suspension Place 1 drop into the right eye 4 (four) times daily. 10/21/21  Yes [provider]   RESTASIS 0.05 % ophthalmic emulsion 1 drop 2 (two) times daily. 10/13/21  Yes [provider]  Spacer/Aero-Holding Chambers (AEROCHAMBER PLUS) inhaler Use as instructed 05/26/16  Yes Melynda Ripple, MD  vitamin B-12 (CYANOCOBALAMIN) 1000 MCG tablet Take 1,000 mcg by mouth daily.   Yes [provider]  Vitamin D, Cholecalciferol, 25 MCG (1000 UT) TABS Take 1,000 Units by mouth daily.   Yes [provider]  citalopram (CELEXA) 20 MG tablet Take 1 tablet (20 mg total) by mouth every evening. 09/26/21   Panosh, Standley Brooking, MD  diclofenac Sodium (VOLTAREN) 1 % GEL Apply 4 g topically 2 (two) times daily as needed (painful areas). 05/02/22   Nyoka Lint, PA-C    Family History Family History  Problem Relation Age of Onset   Hypertension Mother    Lung cancer Mother    Hypertension Father    Lung cancer Father    Breast cancer Sister    Colon cancer Sister        ? 12' s dx - died in 37's   Breast cancer Sister    Hypertension Brother    Rectal cancer Brother    Stomach cancer Brother    Breast cancer Brother    Heart attack Daughter    Esophageal cancer Daughter    Hypertension Daughter    Diabetes Daughter    Breast cancer Paternal Aunt    Lung cancer Other        both parents    Pancreatic cancer Neg Hx     Social History Social History   Tobacco Use   Smoking status: Never   Smokeless tobacco: Never  Vaping Use   Vaping Use: Never used  Substance Use Topics   Alcohol use: No   Drug use: No     Allergies   Penicillins and Aspirin   Review of Systems Review of Systems  Musculoskeletal:  Positive for joint swelling (right knee).     Physical Exam Triage Vital Signs ED Triage Vitals  Enc Vitals Group     BP 05/02/22 1251 (!) 191/61     Pulse Rate 05/02/22 1251 (!) 55     Resp 05/02/22 1251 18     Temp 05/02/22 1251 97.8 F (36.6 C)     Temp Source 05/02/22 1251 Oral     SpO2 05/02/22 1251 97 %     Weight --      Height --       Head Circumference --      Peak Flow --      Pain Score 05/02/22 1247 10     Pain Loc --      Pain Edu? --      Excl. in [place]? --    No data found.  Updated Vital Signs BP (!) 191/61 (BP Location: Left Arm)   Pulse (!) 55   Temp 97.8 F (36.6 C) (Oral)   Resp 18   SpO2 97%   Visual Acuity Right Eye Distance:   Left Eye Distance:   Bilateral Distance:    Right Eye Near:   Left Eye Near:    Bilateral Near:     Physical Exam Constitutional:      Appearance: Normal appearance.  Musculoskeletal:       Legs:     Comments: E: 2+ swelling present across the right knee without any unusual redness.  Range of motion is limited with pain on flexion extension, stability is intact with negative drawers and negative Lachman's.  There is no crepitus with range of motion.  Mild pain on varus valgus stressing.  Neurological:     Mental Status: She is alert.      UC Treatments / Results  Labs (all labs ordered are listed, but only abnormal results are displayed) Labs Reviewed - No data to display  EKG   Radiology DG Knee AP/LAT W/[place] Right  Result Date: 05/02/2022 CLINICAL DATA:  Right knee pain for months, worse lately. No known injury. EXAM: RIGHT KNEE 3 VIEWS COMPARISON:  None Available. FINDINGS: Mild medial joint space narrowing. Mild spur formation involving all 3 joint compartments. The lateral view is somewhat oblique with no gross effusion seen. Medial and lateral meniscus calcifications. No fracture or dislocation. IMPRESSION: 1. Mild tricompartmental degenerative changes. 2. Chondrocalcinosis. Electronically Signed   By: Claudie Revering M.D.   On: 05/02/2022 13:40    Procedures Procedures (including critical care time)  Medications Ordered in UC Medications - No data to display  Initial Impression / Assessment and Plan / UC Course  I have reviewed the triage vital signs and the nursing notes.  Pertinent labs & imaging results that were available during my care of  the patient were reviewed by me and considered in my medical decision making (see chart for details).    Plan: 1.  The chronic pain of the right knee will be treated with the following: A.  Advised to continue taking arthritis Tylenol to help relieve pain. B.  Advised to use Voltaren cream and apply to the area twice daily to help reduce pain. 2.  The arthritis of the right knee will be treated with the following: A.  Advised to use a knee sleeve to help give added support when up and standing. 3.  Advised follow-up PCP or return to urgent care if symptoms fail to improve.  (Consider orthopedic consult) Final Clinical Impressions(s) / UC  Diagnoses   Final diagnoses:  Chronic pain of right knee  Arthritis of right knee     Discharge Instructions        Advised to rub on the prescription strength arthritis cream twice daily to the areas of the knee that hurts to give additional relief.  Advised follow-up PCP or return to urgent care as needed.     ED Prescriptions     Medication Sig Dispense Auth. Provider   diclofenac Sodium (VOLTAREN) 1 % GEL Apply 4 g topically 2 (two) times daily as needed (painful areas). 50 g Nyoka Lint, PA-C      PDMP not reviewed this encounter.   Nyoka Lint, PA-C 05/02/22 1346

## 2022-05-03 ENCOUNTER — Ambulatory Visit (HOSPITAL_COMMUNITY): Payer: Medicare Other

## 2022-05-12 DIAGNOSIS — M79605 Pain in left leg: Secondary | ICD-10-CM | POA: Diagnosis not present

## 2022-05-12 DIAGNOSIS — M79604 Pain in right leg: Secondary | ICD-10-CM | POA: Diagnosis not present

## 2022-05-12 DIAGNOSIS — M25561 Pain in right knee: Secondary | ICD-10-CM | POA: Diagnosis not present

## 2022-05-12 DIAGNOSIS — M79661 Pain in right lower leg: Secondary | ICD-10-CM | POA: Diagnosis not present

## 2022-05-14 DIAGNOSIS — I10 Essential (primary) hypertension: Secondary | ICD-10-CM

## 2022-05-14 DIAGNOSIS — J45909 Unspecified asthma, uncomplicated: Secondary | ICD-10-CM

## 2022-05-16 ENCOUNTER — Ambulatory Visit (HOSPITAL_COMMUNITY)
Admission: RE | Admit: 2022-05-16 | Discharge: 2022-05-16 | Disposition: A | Discharge status: Home / Self Care | Payer: Medicare Other | Source: Ambulatory Visit | Attending: Internal Medicine | Admitting: Internal Medicine

## 2022-05-16 ENCOUNTER — Other Ambulatory Visit: Payer: Self-pay | Admitting: Internal Medicine

## 2022-05-16 ENCOUNTER — Other Ambulatory Visit (HOSPITAL_COMMUNITY): Payer: Self-pay | Admitting: Medical

## 2022-05-16 DIAGNOSIS — R52 Pain, unspecified: Secondary | ICD-10-CM | POA: Diagnosis not present

## 2022-05-16 DIAGNOSIS — M25461 Effusion, right knee: Secondary | ICD-10-CM | POA: Diagnosis not present

## 2022-05-16 DIAGNOSIS — M25561 Pain in right knee: Secondary | ICD-10-CM | POA: Diagnosis not present

## 2022-05-18 ENCOUNTER — Other Ambulatory Visit: Payer: Self-pay

## 2022-05-18 ENCOUNTER — Other Ambulatory Visit: Payer: Self-pay | Admitting: Internal Medicine

## 2022-05-18 MED ORDER — CITALOPRAM HYDROBROMIDE 20 MG PO TABS: 20.0000 mg | ORAL_TABLET | Freq: Every evening | ORAL | 0 refills | Status: DC

## 2022-05-18 NOTE — Telephone Encounter (Signed)
Last refill-01/25/22-30 tabs, 0 refill Last VV with Dr. Kim-04/27/22  No future OV scheduled.

## 2022-05-23 ENCOUNTER — Encounter: Payer: Self-pay | Admitting: Internal Medicine

## 2022-05-24 ENCOUNTER — Ambulatory Visit: Payer: Medicare Other | Admitting: Internal Medicine

## 2022-05-25 ENCOUNTER — Inpatient Hospital Stay: Payer: Medicare Other

## 2022-05-25 ENCOUNTER — Inpatient Hospital Stay: Payer: Medicare Other | Admitting: Hematology

## 2022-05-26 ENCOUNTER — Telehealth: Payer: Self-pay | Admitting: Internal Medicine

## 2022-05-26 NOTE — Telephone Encounter (Signed)
Family relocating to a 1st floor apartment. Apartment office is requesting a letter saying the patient would be better off not having to climb steps (trying to avoid additional moving fees for apartment transfer based on patient's condition).

## 2022-06-05 ENCOUNTER — Other Ambulatory Visit: Payer: Self-pay

## 2022-06-05 DIAGNOSIS — D638 Anemia in other chronic diseases classified elsewhere: Secondary | ICD-10-CM

## 2022-06-05 NOTE — Progress Notes (Deleted)
[place]   Telephone:(336) [phone] Fax:(336) [phone]   Clinic Follow up Note   Patient Care Team: Panosh, Standley Brooking, MD as PCP - General (Internal Medicine) Nahser, Wonda Cheng, MD as PCP - Cardiology (Cardiology) Marlou Sa, Tonna Corner, MD (Orthopedic Surgery) Truitt Merle, MD as Consulting Physician (Hematology) Madelon Lips, MD as Consulting Physician (Nephrology) Monna Fam, MD as Consulting Physician (Ophthalmology) Viona Gilmore, [facility] (Inactive) as Pharmacist (Pharmacist) Tobi Bastos, RN as [employer] Feliz Beam, MD as Referring Physician (Ophthalmology) Collene Gobble, MD as Consulting Physician (Pulmonary Disease)  Date of Service:  06/05/2022  CHIEF COMPLAINT: f/u of anemia of chronic disease   CURRENT THERAPY: Aranesp injections, 187mg q2weeks, starting 03/24/21, will change to q6weeks starting 07/18/21, hold if Hg>=11.0   *will change to q4 weeks from 01/19/22 due to worsening anemia   ASSESSMENT: *** Angela Elfersis a 89y.o. female with   No problem-specific Assessment & Plan notes found for this encounter.  ***   PLAN: {Everything Dr. FBurr Medicotalks to pt about, including reviewing scans and labs. } -{proceed with ***} -{lab with/without flush and f/u when?}   SUMMARY OF ONCOLOGIC HISTORY: Oncology History   No history exists.     INTERVAL HISTORY: *** [name] here for a follow up of  anemia of chronic disease She was last seen by  NP Lacie on 0/11/2021 She presents to the clinic    All other systems were reviewed with the patient and are negative.  MEDICAL HISTORY:  Past Medical History:  Diagnosis Date   Acute superficial venous thrombosis of left lower extremity 03/27/2014   concern about hx and potential extensive on exam tenderness calf and low dose lovenox 40 qd 2-4 weekselevetion and close  fu advised .    Allergy    Anemia    Anxiety    Arthritis    "shoulders"  (09/09/2012)   Asthma 09/08/2012   Carotid bruit    [id number]  low risk 1 - 39%   Cataract    bil cateracts removed   Chronic kidney disease    Clotting disorder (HCC)    blood clots in legs   Diabetes mellitus without complication ([facility]    "Borderline" per pt; being monitored.  no meds per pt   Diverticulosis    Dyspnea 04/27/2014   GERD (gastroesophageal reflux disease)    Headache(784.0)    "related to my high blood pressure" (09/09/2012)   Hiatal hernia    History of DVT (deep vein thrombosis)    "RLE" (09/09/2012) coumadine cant take asa so on plavix    HTN (hypertension) 09/08/2012   Hyperlipidemia    Lupus ([place]    "cured years ago" (09/09/2012)   Other dysphagia 02/11/2013   Syncope and collapse 01/21/2018   Varicose veins    Varicose veins of leg with complications [id number]   SURGICAL HISTORY: Past Surgical History:  Procedure Laterality Date   ABDOMINAL HYSTERECTOMY     partial   BREAST EXCISIONAL BIOPSY Right    BREAST SURGERY     CARPAL TUNNEL RELEASE Right 2000's   CARPAL TUNNEL RELEASE Bilateral 10/21/2013   Procedure: BILATERAL  CARPAL TUNNEL RELEASE;  Surgeon: GWynonia Sours MD;  [place]  Service: Orthopedics;  Laterality: Bilateral;   COLONOSCOPY     SHOULDER OPEN ROTATOR CUFF REPAIR Bilateral 2000's   TRIGGER FINGER RELEASE Right 10/21/2013   Procedure: RELEASE TRIGGER FINGER/A-1  PULLEY RIGHT MIDDLE AND RIGHT RING;  Surgeon: [name], MD;  Location: [place];  Service: Orthopedics;  Laterality: Right;   UPPER GASTROINTESTINAL ENDOSCOPY      I have reviewed the social history and family history with the patient and they are unchanged from previous note.  ALLERGIES:  is allergic to penicillins and aspirin.  MEDICATIONS:  Current Outpatient Medications  Medication Sig Dispense Refill   acetaminophen (TYLENOL) 500 MG tablet Take 500 mg by mouth in the morning and at bedtime.     albuterol (PROVENTIL)  (2.5 MG/3ML) 0.083% nebulizer solution Take 3 mLs (2.5 mg total) by nebulization every 6 (six) hours as needed for shortness of breath or wheezing. 360 mL 6   albuterol (VENTOLIN HFA) 108 (90 Base) MCG/ACT inhaler USE 2 PUFFS EVERY 6 HOURS AS NEEDED FOR WHEEZING. 8.5 g 3   amLODipine (NORVASC) 5 MG tablet Take 1 tablet (5 mg total) by mouth daily. 90 tablet 2   atorvastatin (LIPITOR) 40 MG tablet Take 1 tablet (40 mg total) by mouth daily. 90 tablet 3   azaTHIOprine (IMURAN) 50 MG tablet Take 25 mg by mouth at bedtime.     Azelastine-Fluticasone 137-50 MCG/ACT SUSP PLACE ONE SPRAY IN EACH NOSTRIL TWICE DAILY (IN THE MORNING AND AT BEDTIME) 23 g 6   Budeson-Glycopyrrol-Formoterol (BREZTRI AEROSPHERE) 160-9-4.8 MCG/ACT AERO INHALE 2 PUFFS INTO THE LUNGS TWICE DAILY, IN THE MORNING & AT BEDTIME. 10.7 g 11   carvedilol (COREG) 6.25 MG tablet Take 6.25 mg by mouth daily.     cetirizine (ZYRTEC) 10 MG tablet Take 10 mg by mouth daily.     citalopram (CELEXA) 20 MG tablet Take 1 tablet (20 mg total) by mouth every evening. 90 tablet 0   cloNIDine (CATAPRES) 0.1 MG tablet Take 1 tablet every 12 hours as needed for blood pressures greater than 99991111 systolic (Patient taking differently: Take 0.1 mg by mouth daily.) 15 tablet 0   clopidogrel (PLAVIX) 75 MG tablet TAKE ONE TABLET BY MOUTH ONCE DAILY. 30 tablet 1   diclofenac Sodium (VOLTAREN) 1 % GEL Apply 4 g topically 2 (two) times daily as needed (painful areas). 50 g 1   doxazosin (CARDURA) 2 MG tablet Take 3 tablets (6 mg total) by mouth at bedtime. 270 tablet 1   ferrous sulfate 325 (65 FE) MG EC tablet Take 1 tablet (325 mg total) by mouth daily with breakfast. 30 tablet 3   fluticasone (FLONASE) 50 MCG/ACT nasal spray Place 2 sprays into both nostrils daily. 16 g 2   furosemide (LASIX) 40 MG tablet Take 40-80 mg by mouth See admin instructions. 80 mg in the am and 40 mg in the pm     hydrALAZINE (APRESOLINE) 50 MG tablet Take 1&1/2 tablets (75 mg total)  by mouth 3 (three) times daily. 405 tablet 1   LORazepam (ATIVAN) 0.5 MG tablet TAKE ONE TABLET BY MOUTH TWICE DAILY AS NEEDED FOR ANXIETY 30 tablet 0   montelukast (SINGULAIR) 10 MG tablet TAKE ONE TABLET BY MOUTH ONCE DAILY 90 tablet 0   ondansetron (ZOFRAN-ODT) 8 MG disintegrating tablet 12m ODT q4 hours prn nausea 20 tablet 0   pantoprazole (PROTONIX) 40 MG tablet Take 1 tablet (40 mg total) by mouth 2 (two) times daily. 180 tablet 1   potassium chloride (KLOR-CON) 10 MEQ tablet TAKE FOUR TABLETS BY MOUTH DAILY. 120 tablet 3   prednisoLONE acetate (PRED FORTE) 1 % ophthalmic suspension Place 1 drop into the right eye 4 (  four) times daily.     RESTASIS 0.05 % ophthalmic emulsion 1 drop 2 (two) times daily.     Spacer/Aero-Holding Chambers (AEROCHAMBER PLUS) inhaler Use as instructed 1 each 2   vitamin B-12 (CYANOCOBALAMIN) 1000 MCG tablet Take 1,000 mcg by mouth daily.     Vitamin D, Cholecalciferol, 25 MCG (1000 UT) TABS Take 1,000 Units by mouth daily.     No current facility-administered medications for this visit.    PHYSICAL EXAMINATION: ECOG PERFORMANCE STATUS: {CHL ONC ECOG PS:[id number]}  There were no vitals filed for this visit. Wt Readings from Last 3 Encounters:  04/27/22 144 lb 6.4 oz (65.5 kg)  01/25/22 144 lb 6.4 oz (65.5 kg)  01/19/22 147 lb (66.7 kg)    {Only keep what was examined. If exam not performed, can use .CEXAM } GENERAL:alert, no distress and comfortable SKIN: skin color, texture, turgor are normal, no rashes or significant lesions EYES: normal, Conjunctiva are pink and non-injected, sclera clear {OROPHARYNX:no exudate, no erythema and lips, buccal mucosa, and tongue normal}  NECK: supple, thyroid normal size, non-tender, without nodularity LYMPH:  no palpable lymphadenopathy in the cervical, axillary {or inguinal} LUNGS: clear to auscultation and percussion with normal breathing effort HEART: regular rate & rhythm and no murmurs and no lower extremity  edema ABDOMEN:abdomen soft, non-tender and normal bowel sounds Musculoskeletal:no cyanosis of digits and no clubbing  NEURO: alert & oriented x 3 with fluent speech, no focal motor/sensory deficits  LABORATORY DATA:  I have reviewed the data as listed    Latest Ref Rng & Units 04/24/2022   11:19 AM 03/23/2022    2:32 PM 02/20/2022    2:11 PM  CBC  WBC 4.0 - 10.5 K/uL 3.4  3.2  3.8   Hemoglobin 12.0 - 15.0 g/dL 10.0  9.9  10.1   Hematocrit 36.0 - 46.0 % 30.7  30.9  30.6   Platelets 150 - 400 K/uL 189  186  160         Latest Ref Rng & Units 11/22/2021   10:13 AM 11/16/2021   12:00 AM 09/12/2021   12:27 AM  CMP  Glucose 70 - 99 mg/dL   128   BUN 4 - 21  41     35   Creatinine 0.5 - 1.1  2.8     2.34   Sodium 137 - 147  138     138   Potassium 3.5 - 5.1 mEq/L  3.7     4.0   Chloride 99 - 108  103     104   CO2 22 - 32 mmol/L   23   Calcium 8.7 - 10.7  9.3     9.1   Total Protein 6.5 - 8.1 g/dL   6.3   Total Bilirubin 0.3 - 1.2 mg/dL   0.7   Alkaline Phos 38 - 126 U/L   34   AST 15 - 41 U/L   21   ALT 0 - 32 IU/L 5   7      This result is from an external source.      RADIOGRAPHIC STUDIES: I have personally reviewed the radiological images as listed and agreed with the findings in the report. No results found.    No orders of the defined types were placed in this encounter.  All questions were answered. The patient knows to call the clinic with any problems, questions or concerns. No barriers to learning was detected. The total time spent in the  appointment was {CHL ONC TIME VISIT - [id number].     Baldemar Friday, CMA 06/05/2022   I, Audry Riles, CMA, am acting as scribe for Truitt Merle, MD.   {Add scribe attestation statement}

## 2022-06-06 ENCOUNTER — Inpatient Hospital Stay: Payer: Medicare Other

## 2022-06-06 ENCOUNTER — Inpatient Hospital Stay: Payer: Medicare Other | Attending: Hematology | Admitting: Hematology

## 2022-06-06 DIAGNOSIS — Z90710 Acquired absence of both cervix and uterus: Secondary | ICD-10-CM | POA: Insufficient documentation

## 2022-06-06 DIAGNOSIS — Z886 Allergy status to analgesic agent status: Secondary | ICD-10-CM | POA: Insufficient documentation

## 2022-06-06 DIAGNOSIS — I129 Hypertensive chronic kidney disease with stage 1 through stage 4 chronic kidney disease, or unspecified chronic kidney disease: Secondary | ICD-10-CM | POA: Insufficient documentation

## 2022-06-06 DIAGNOSIS — E785 Hyperlipidemia, unspecified: Secondary | ICD-10-CM | POA: Insufficient documentation

## 2022-06-06 DIAGNOSIS — Z79899 Other long term (current) drug therapy: Secondary | ICD-10-CM | POA: Insufficient documentation

## 2022-06-06 DIAGNOSIS — Z7902 Long term (current) use of antithrombotics/antiplatelets: Secondary | ICD-10-CM | POA: Insufficient documentation

## 2022-06-06 DIAGNOSIS — Z880 Allergy status to penicillin: Secondary | ICD-10-CM | POA: Insufficient documentation

## 2022-06-06 DIAGNOSIS — N184 Chronic kidney disease, stage 4 (severe): Secondary | ICD-10-CM | POA: Insufficient documentation

## 2022-06-06 DIAGNOSIS — D631 Anemia in chronic kidney disease: Secondary | ICD-10-CM | POA: Insufficient documentation

## 2022-06-06 DIAGNOSIS — F419 Anxiety disorder, unspecified: Secondary | ICD-10-CM | POA: Insufficient documentation

## 2022-06-06 DIAGNOSIS — M7989 Other specified soft tissue disorders: Secondary | ICD-10-CM | POA: Insufficient documentation

## 2022-06-06 DIAGNOSIS — J45909 Unspecified asthma, uncomplicated: Secondary | ICD-10-CM | POA: Insufficient documentation

## 2022-06-06 DIAGNOSIS — Z86718 Personal history of other venous thrombosis and embolism: Secondary | ICD-10-CM | POA: Insufficient documentation

## 2022-06-06 NOTE — Assessment & Plan Note (Deleted)
secondary to CKD.  -I previously saw her in 06/2016, at which time her hgb was up to 10.4 and her ferritin and iron levels were normal. Since that time, her hgb has been trending down, now in the 9 range. Repeated iron studies were all normal. No high suspicion for MDS  -she is taking oral iron and vit B12. -Her anemia is likely secondary to CKD  -We discussed the benefit on the risks of Epo injection, especially the risk of thrombosis, including heart attack and stroke. She had a history of DVT in the past. I started her on Aranesp q2weeks injections on 03/24/21. Dose and frequency has been adjust to keep her Hg around 9-11

## 2022-06-08 ENCOUNTER — Encounter: Payer: Self-pay | Admitting: Hematology

## 2022-06-08 ENCOUNTER — Other Ambulatory Visit: Payer: Self-pay

## 2022-06-08 ENCOUNTER — Inpatient Hospital Stay: Payer: Medicare Other

## 2022-06-08 ENCOUNTER — Inpatient Hospital Stay (HOSPITAL_BASED_OUTPATIENT_CLINIC_OR_DEPARTMENT_OTHER): Payer: Medicare Other | Admitting: Hematology

## 2022-06-08 VITALS — HR 96 | Temp 97.5°F | Resp 18 | Ht 60.0 in | Wt 159.2 lb

## 2022-06-08 VITALS — BP 166/58 | HR 71 | Temp 98.0°F | Resp 18

## 2022-06-08 DIAGNOSIS — E785 Hyperlipidemia, unspecified: Secondary | ICD-10-CM | POA: Diagnosis not present

## 2022-06-08 DIAGNOSIS — Z86718 Personal history of other venous thrombosis and embolism: Secondary | ICD-10-CM | POA: Diagnosis not present

## 2022-06-08 DIAGNOSIS — D638 Anemia in other chronic diseases classified elsewhere: Secondary | ICD-10-CM

## 2022-06-08 DIAGNOSIS — Z79899 Other long term (current) drug therapy: Secondary | ICD-10-CM | POA: Diagnosis not present

## 2022-06-08 DIAGNOSIS — I129 Hypertensive chronic kidney disease with stage 1 through stage 4 chronic kidney disease, or unspecified chronic kidney disease: Secondary | ICD-10-CM | POA: Diagnosis not present

## 2022-06-08 DIAGNOSIS — N184 Chronic kidney disease, stage 4 (severe): Secondary | ICD-10-CM | POA: Diagnosis not present

## 2022-06-08 DIAGNOSIS — Z880 Allergy status to penicillin: Secondary | ICD-10-CM | POA: Diagnosis not present

## 2022-06-08 DIAGNOSIS — Z90710 Acquired absence of both cervix and uterus: Secondary | ICD-10-CM | POA: Diagnosis not present

## 2022-06-08 DIAGNOSIS — M7989 Other specified soft tissue disorders: Secondary | ICD-10-CM | POA: Diagnosis not present

## 2022-06-08 DIAGNOSIS — J45909 Unspecified asthma, uncomplicated: Secondary | ICD-10-CM | POA: Diagnosis not present

## 2022-06-08 DIAGNOSIS — Z886 Allergy status to analgesic agent status: Secondary | ICD-10-CM | POA: Diagnosis not present

## 2022-06-08 DIAGNOSIS — Z7902 Long term (current) use of antithrombotics/antiplatelets: Secondary | ICD-10-CM | POA: Diagnosis not present

## 2022-06-08 DIAGNOSIS — F419 Anxiety disorder, unspecified: Secondary | ICD-10-CM | POA: Diagnosis not present

## 2022-06-08 DIAGNOSIS — D631 Anemia in chronic kidney disease: Secondary | ICD-10-CM | POA: Diagnosis not present

## 2022-06-08 LAB — CBC WITH DIFFERENTIAL (CANCER CENTER ONLY)
Abs Immature Granulocytes: 0.02 10*3/uL (ref 0.00–0.07)
Basophils Absolute: 0 10*3/uL (ref 0.0–0.1)
Basophils Relative: 1 %
Eosinophils Absolute: 0.2 10*3/uL (ref 0.0–0.5)
Eosinophils Relative: 5 %
HCT: 30.7 % — ABNORMAL LOW (ref 36.0–46.0)
Hemoglobin: 10 g/dL — ABNORMAL LOW (ref 12.0–15.0)
Immature Granulocytes: 1 %
Lymphocytes Relative: 34 %
Lymphs Abs: 1.2 10*3/uL (ref 0.7–4.0)
MCH: 29.7 pg (ref 26.0–34.0)
MCHC: 32.6 g/dL (ref 30.0–36.0)
MCV: 91.1 fL (ref 80.0–100.0)
Monocytes Absolute: 0.3 10*3/uL (ref 0.1–1.0)
Monocytes Relative: 10 %
Neutro Abs: 1.8 10*3/uL (ref 1.7–7.7)
Neutrophils Relative %: 49 %
Platelet Count: 141 10*3/uL — ABNORMAL LOW (ref 150–400)
RBC: 3.37 MIL/uL — ABNORMAL LOW (ref 3.87–5.11)
RDW: 15.2 % (ref 11.5–15.5)
WBC Count: 3.5 10*3/uL — ABNORMAL LOW (ref 4.0–10.5)
nRBC: 0 % (ref 0.0–0.2)

## 2022-06-08 MED ORDER — DARBEPOETIN ALFA 100 MCG/0.5ML IJ SOSY
100.0000 ug | PREFILLED_SYRINGE | Freq: Once | INTRAMUSCULAR | Status: AC
  Administered 2022-06-08: 100 ug via SUBCUTANEOUS
  Filled 2022-06-08: qty 0.5

## 2022-06-08 NOTE — Patient Instructions (Signed)

## 2022-06-08 NOTE — Progress Notes (Signed)
[place]   Telephone:(336) [phone] Fax:(336) [phone]   Clinic Follow up Note   Patient Care Team: Panosh, Standley Brooking, MD as PCP - General (Internal Medicine) Nahser, Wonda Cheng, MD as PCP - Cardiology (Cardiology) Marlou Sa, Tonna Corner, MD (Orthopedic Surgery) Truitt Merle, MD as Consulting Physician (Hematology) Madelon Lips, MD as Consulting Physician (Nephrology) Monna Fam, MD as Consulting Physician (Ophthalmology) Viona Gilmore, [facility] (Inactive) as Pharmacist (Pharmacist) Tobi Bastos, RN as [employer] Feliz Beam, MD as Referring Physician (Ophthalmology) Collene Gobble, MD as Consulting Physician (Pulmonary Disease)  Date of Service:  06/08/2022  CHIEF COMPLAINT: f/u of anemia of chronic disease   CURRENT THERAPY:  Aranesp injections, 157mcg q2weeks, starting 03/24/21, will change to q6weeks starting 07/18/21, hold if Hg>=11.0    ASSESSMENT:  [name] is a 89 y.o. female with   1. Anemia of chronic disease secondary to CKD.  -I previously saw her in 06/2016, at which time her hgb was up to 10.4 and her ferritin and iron levels were normal. Since that time, her hgb has been trending down, now in the 9 range. Repeated iron studies were all normal. No high suspicion for MDS  -she is taking oral iron and vit B12. -Her anemia is likely secondary to CKD  -We discussed the benefit on the risks of Epo injection, especially the risk of thrombosis, including heart attack and stroke. She had a history of DVT in the past. I started her on Aranesp q2weeks injections on 03/24/21. -She is clinically stable, hemoglobin has been around 10-10.5, tolerating Aranesp injection well.  Will continue.   2. Chronic Renal Disease, stage IV -Follow-up with the nephrologist     PLAN: - lab reviewed hg 10.0, will proceed Aranesp injection today and continue every months if Hg<11 - f/u in 6 months    INTERVAL HISTORY:  [name] is here for a follow up of  anemia of chronic disease She was last seen by NP Lacie on 01/19/2022 She presents to the clinic accompanied by family member. Pt states she is doing good. Pt reports she been having problems with her knee and had to remove fluid. Pt RT leg is  swollen. Pt reports that her energy level is low. Pt requesting a medical note for ambulatory. Pt states she ambulates with a walker.      All other systems were reviewed with the patient and are negative.  MEDICAL HISTORY:  Past Medical History:  Diagnosis Date   Acute superficial venous thrombosis of left lower extremity 03/27/2014   concern about hx and potential extensive on exam tenderness calf and low dose lovenox 40 qd 2-4 weekselevetion and close  fu advised .    Allergy    Anemia    Anxiety    Arthritis    "shoulders" (09/09/2012)   Asthma 09/08/2012   Carotid bruit    [place] 2018  low risk 1 - 39%   Cataract    bil cateracts removed   Chronic kidney disease    Clotting disorder (HCC)    blood clots in legs   Diabetes mellitus without complication ([place])    "Borderline" per pt; being monitored.  no meds per pt   Diverticulosis    Dyspnea 04/27/2014   GERD (gastroesophageal reflux disease)    Headache(784.0)    "related to my high blood pressure" (09/09/2012)   Hiatal hernia    History of DVT (deep vein thrombosis)    "  RLE" (09/09/2012) coumadine cant take asa so on plavix    HTN (hypertension) 09/08/2012   Hyperlipidemia    Lupus ([place])    "cured years ago" (09/09/2012)   Other dysphagia 02/11/2013   Syncope and collapse 01/21/2018   Varicose veins    Varicose veins of leg with complications 62/07/5595    SURGICAL HISTORY: Past Surgical History:  Procedure Laterality Date   ABDOMINAL HYSTERECTOMY     partial   BREAST EXCISIONAL BIOPSY Right    BREAST SURGERY     CARPAL TUNNEL RELEASE Right 2000's   CARPAL TUNNEL RELEASE Bilateral 10/21/2013   Procedure: BILATERAL  CARPAL TUNNEL RELEASE;   Surgeon: [name], MD;  Location: [place];  Service: Orthopedics;  Laterality: Bilateral;   COLONOSCOPY     SHOULDER OPEN ROTATOR CUFF REPAIR Bilateral 2000's   TRIGGER FINGER RELEASE Right 10/21/2013   Procedure: RELEASE TRIGGER FINGER/A-1 PULLEY RIGHT MIDDLE AND RIGHT RING;  Surgeon: [name], MD;  Location: [place];  Service: Orthopedics;  Laterality: Right;   UPPER GASTROINTESTINAL ENDOSCOPY      I have reviewed the social history and family history with the patient and they are unchanged from previous note.  ALLERGIES:  is allergic to penicillins and aspirin.  MEDICATIONS:  Current Outpatient Medications  Medication Sig Dispense Refill   acetaminophen (TYLENOL) 500 MG tablet Take 500 mg by mouth in the morning and at bedtime.     albuterol (PROVENTIL) (2.5 MG/3ML) 0.083% nebulizer solution Take 3 mLs (2.5 mg total) by nebulization every 6 (six) hours as needed for shortness of breath or wheezing. 360 mL 6   albuterol (VENTOLIN HFA) 108 (90 Base) MCG/ACT inhaler USE 2 PUFFS EVERY 6 HOURS AS NEEDED FOR WHEEZING. 8.5 g 3   amLODipine (NORVASC) 5 MG tablet Take 1 tablet (5 mg total) by mouth daily. 90 tablet 2   atorvastatin (LIPITOR) 40 MG tablet Take 1 tablet (40 mg total) by mouth daily. 90 tablet 3   azaTHIOprine (IMURAN) 50 MG tablet Take 25 mg by mouth at bedtime.     Azelastine-Fluticasone 137-50 MCG/ACT SUSP PLACE ONE SPRAY IN EACH NOSTRIL TWICE DAILY (IN THE MORNING AND AT BEDTIME) 23 g 6   Budeson-Glycopyrrol-Formoterol (BREZTRI AEROSPHERE) 160-9-4.8 MCG/ACT AERO INHALE 2 PUFFS INTO THE LUNGS TWICE DAILY, IN THE MORNING & AT BEDTIME. 10.7 g 11   carvedilol (COREG) 6.25 MG tablet Take 6.25 mg by mouth daily.     cetirizine (ZYRTEC) 10 MG tablet Take 10 mg by mouth daily.     citalopram (CELEXA) 20 MG tablet Take 1 tablet (20 mg total) by mouth every evening. 90 tablet 0   cloNIDine (CATAPRES) 0.1 MG tablet Take 1 tablet every 12 hours as  needed for blood pressures greater than 416 systolic (Patient taking differently: Take 0.1 mg by mouth daily.) 15 tablet 0   clopidogrel (PLAVIX) 75 MG tablet TAKE ONE TABLET BY MOUTH ONCE DAILY. 30 tablet 1   diclofenac Sodium (VOLTAREN) 1 % GEL Apply 4 g topically 2 (two) times daily as needed (painful areas). 50 g 1   doxazosin (CARDURA) 2 MG tablet Take 3 tablets (6 mg total) by mouth at bedtime. 270 tablet 1   ferrous sulfate 325 (65 FE) MG EC tablet Take 1 tablet (325 mg total) by mouth daily with breakfast. 30 tablet 3   fluticasone (FLONASE) 50 MCG/ACT nasal spray Place 2 sprays into both nostrils daily. 16 g 2   furosemide (LASIX) 40 MG  tablet Take 40-80 mg by mouth See admin instructions. 80 mg in the am and 40 mg in the pm     hydrALAZINE (APRESOLINE) 50 MG tablet Take 1&1/2 tablets (75 mg total) by mouth 3 (three) times daily. 405 tablet 1   LORazepam (ATIVAN) 0.5 MG tablet TAKE ONE TABLET BY MOUTH TWICE DAILY AS NEEDED FOR ANXIETY 30 tablet 0   montelukast (SINGULAIR) 10 MG tablet TAKE ONE TABLET BY MOUTH ONCE DAILY 90 tablet 0   ondansetron (ZOFRAN-ODT) 8 MG disintegrating tablet 8mg  ODT q4 hours prn nausea 20 tablet 0   pantoprazole (PROTONIX) 40 MG tablet Take 1 tablet (40 mg total) by mouth 2 (two) times daily. 180 tablet 1   potassium chloride (KLOR-CON) 10 MEQ tablet TAKE FOUR TABLETS BY MOUTH DAILY. 120 tablet 3   prednisoLONE acetate (PRED FORTE) 1 % ophthalmic suspension Place 1 drop into the right eye 4 (four) times daily.     RESTASIS 0.05 % ophthalmic emulsion 1 drop 2 (two) times daily.     Spacer/Aero-Holding Chambers (AEROCHAMBER PLUS) inhaler Use as instructed 1 each 2   vitamin B-12 (CYANOCOBALAMIN) 1000 MCG tablet Take 1,000 mcg by mouth daily.     Vitamin D, Cholecalciferol, 25 MCG (1000 UT) TABS Take 1,000 Units by mouth daily.     No current facility-administered medications for this visit.    PHYSICAL EXAMINATION: ECOG PERFORMANCE STATUS: 2 - Symptomatic,  <50% confined to bed  Vitals:   06/08/22 1405  Pulse: 96  Resp: 18  Temp: (!) 97.5 F (36.4 C)  SpO2: 98%   Wt Readings from Last 3 Encounters:  06/08/22 159 lb 3.2 oz (72.2 kg)  04/27/22 144 lb 6.4 oz (65.5 kg)  01/25/22 144 lb 6.4 oz (65.5 kg)    HEART: regular rate & rhythm and no murmurs and (+)lower extremity edema  LABORATORY DATA:  I have reviewed the data as listed    Latest Ref Rng & Units 06/08/2022   12:42 PM 04/24/2022   11:19 AM 03/23/2022    2:32 PM  CBC  WBC 4.0 - 10.5 K/uL 3.5  3.4  3.2   Hemoglobin 12.0 - 15.0 g/dL 10.0  10.0  9.9   Hematocrit 36.0 - 46.0 % 30.7  30.7  30.9   Platelets 150 - 400 K/uL 141  189  186         Latest Ref Rng & Units 11/22/2021   10:13 AM 11/16/2021   12:00 AM 09/12/2021   12:27 AM  CMP  Glucose 70 - 99 mg/dL   128   BUN 4 - 21  41     35   Creatinine 0.5 - 1.1  2.8     2.34   Sodium 137 - 147  138     138   Potassium 3.5 - 5.1 mEq/L  3.7     4.0   Chloride 99 - 108  103     104   CO2 22 - 32 mmol/L   23   Calcium 8.7 - 10.7  9.3     9.1   Total Protein 6.5 - 8.1 g/dL   6.3   Total Bilirubin 0.3 - 1.2 mg/dL   0.7   Alkaline Phos 38 - 126 U/L   34   AST 15 - 41 U/L   21   ALT 0 - 32 IU/L 5   7      This result is from an external source.  RADIOGRAPHIC STUDIES: I have personally reviewed the radiological images as listed and agreed with the findings in the report. No results found.    No orders of the defined types were placed in this encounter.  All questions were answered. The patient knows to call the clinic with any problems, questions or concerns. No barriers to learning was detected. The total time spent in the appointment was 15 minutes.     Truitt Merle, MD 06/08/2022   Felicity Coyer, CMA, am acting as scribe for Truitt Merle, MD.   I have reviewed the above documentation for accuracy and completeness, and I agree with the above.

## 2022-06-08 NOTE — Progress Notes (Signed)
Ok to give Aranesp injection with elevated blood pressure per Dr. Burr Medico.

## 2022-06-15 ENCOUNTER — Telehealth: Payer: Self-pay | Admitting: Internal Medicine

## 2022-06-15 NOTE — Telephone Encounter (Signed)
Called and left detailed message for patient to call office back in regards to her Judithann Sauger and her wanting refills and asked for what pharmacy she would like it sent into.

## 2022-06-20 DIAGNOSIS — H44113 Panuveitis, bilateral: Secondary | ICD-10-CM | POA: Diagnosis not present

## 2022-06-20 DIAGNOSIS — H3581 Retinal edema: Secondary | ICD-10-CM | POA: Diagnosis not present

## 2022-06-20 DIAGNOSIS — Z79899 Other long term (current) drug therapy: Secondary | ICD-10-CM | POA: Diagnosis not present

## 2022-06-20 DIAGNOSIS — H35033 Hypertensive retinopathy, bilateral: Secondary | ICD-10-CM | POA: Diagnosis not present

## 2022-06-22 ENCOUNTER — Encounter: Payer: Self-pay | Admitting: Cardiovascular Disease

## 2022-06-22 DIAGNOSIS — I129 Hypertensive chronic kidney disease with stage 1 through stage 4 chronic kidney disease, or unspecified chronic kidney disease: Secondary | ICD-10-CM | POA: Diagnosis not present

## 2022-06-22 DIAGNOSIS — N2581 Secondary hyperparathyroidism of renal origin: Secondary | ICD-10-CM | POA: Diagnosis not present

## 2022-06-22 DIAGNOSIS — N184 Chronic kidney disease, stage 4 (severe): Secondary | ICD-10-CM | POA: Diagnosis not present

## 2022-06-22 DIAGNOSIS — D631 Anemia in chronic kidney disease: Secondary | ICD-10-CM | POA: Diagnosis not present

## 2022-06-22 MED ORDER — BREZTRI AEROSPHERE 160-9-4.8 MCG/ACT IN AERO: INHALATION_SPRAY | RESPIRATORY_TRACT | 4 refills | Status: DC

## 2022-06-22 NOTE — Telephone Encounter (Signed)
Yancey Flemings granddaughter states patient's pharmacy is [facility], [place] phone number is [phone].

## 2022-06-22 NOTE — Progress Notes (Signed)
Cardiology    Patient ID: [name] MRN: [id number]; DOB: 04-10-1934    Primary Care Provider: Burnis Medin, MD Primary Cardiologist: Mertie Moores, MD  Primary Electrophysiologist:  None   Problem list 1.  Hypertension 2.  Hyperlipidemia 3.  Prior DVT 4.  Lupus 5.  Chronic kidney disease 6.  Syncope 7.  Leg edema   Chief Complaint:  Syncope   Patient Profile:   [name] is a 89 y.o. female with history of hypertension and hyperlipidemia.  She was recently seen by Richardson Dopp, PA for an episode of syncope.  Seen in the past for an episode of leg swelling in 2015.      [name] is seen for further evaluation following her episode of syncope in July.  During that time, her metoprolol had just been changed to carvedilol.   Echocardiogram revealed normal left ventricular systolic function.  She has moderate pulmonary hypertension with an estimated PA pressure of 45. V Q scan was low risk for pulmonary embolus. She had a 30 day monitor that revealed normal sinus rhythm and sinus bradycardia.  She had one 5 beat run of ventricular ectopy which was not significant.  No further episodes of syncope  BP is still elevated.   Still eats some salty foods.   July, 23, 2020 :  [name] is seen for follow up visit BP is still mildy elevated.  Was hospitalized this past weekend.   Has vomitting and diarrhea.   These have resolved.  Troponin was 0.12   Still eating a very salty diet  Legs remains swollen .   Denies any chest pain .  Breathing is better.  Last echo was 5/19 which revealed normal LV function. Moderate pulmonary HTN - PA pressure of 45 .   August 06, 2019:  [name] is seen today for follow-up visit.  She has a history of hypertension.  She has a history of moderate pulmonary hypertension with an estimated PA pressure of 45. In the past she has been eating a very high salt diet.  She is doing well.  She is not had any further episodes of syncope  or presyncope.  She is not had any episodes of chest pain or shortness of breath.  She is been trying to avoid eating excessive salt.  Her blood pressure and heart rate are well controlled in the office visit today.  March 21, 2021: [name] is seen today for follow-up of her hypertension, chronic diastolic congestive heart failure, moderate pulmonary hypertension with an estimated PA pressure of 45 mm of mercury.  She has been eating a very high salt diet in the past. She days she is eating better now. No CP or dyspnea  Her leg swelling has improved.  Has not taken her BP meds this am.   December 06, 2021 Seen with daugher , [name] is seen for HTN, chronic diastolic chf, moderate pulmonary HTN She has had some heart racing symptoms   Overall breathing is ok  Has occasional ches pain ( when she lies back ) Has palpitations ( HR faster than normal )  Possibly associated with 3 recent deaths in her family .    Feb. 9, 2024 [name] is seen for follow up of her HTN, diastolic CHF, moderate pulmonary HTN  Has had some swelling in her legs .  Orthopedic did venous duplex - negative for PE  Limits her salty foods,   gets take out food on occasion .   Gave  instructions about ordering  the [facility] Doctor  Recommended leg elevation   Creatinine was 2.8 in July 2023   She would like to see the doctors in the advanced CHF clinic        Past Medical History:  Diagnosis Date   Acute superficial venous thrombosis of left lower extremity 03/27/2014   concern about hx and potential extensive on exam tenderness calf and low dose lovenox 40 qd 2-4 weekselevetion and close  fu advised .    Allergy    Anemia    Anxiety    Arthritis    "shoulders" (09/09/2012)   Asthma 09/08/2012   Carotid bruit    [place] 2018  low risk 1 - 39%   Cataract    bil cateracts removed   Chronic kidney disease    Clotting disorder (HCC)    blood clots in legs   Diabetes mellitus without complication ([place])     "Borderline" per pt; being monitored.  no meds per pt   Diverticulosis    Dyspnea 04/27/2014   GERD (gastroesophageal reflux disease)    Headache(784.0)    "related to my high blood pressure" (09/09/2012)   Hiatal hernia    History of DVT (deep vein thrombosis)    "RLE" (09/09/2012) coumadine cant take asa so on plavix    HTN (hypertension) 09/08/2012   Hyperlipidemia    Lupus ([place])    "cured years ago" (09/09/2012)   Other dysphagia 02/11/2013   Syncope and collapse 01/21/2018   Varicose veins    Varicose veins of leg with complications [id number]    Past Surgical History:  Procedure Laterality Date   ABDOMINAL HYSTERECTOMY     partial   BREAST EXCISIONAL BIOPSY Right    BREAST SURGERY     CARPAL TUNNEL RELEASE Right 2000's   CARPAL TUNNEL RELEASE Bilateral 10/21/2013   Procedure: BILATERAL  CARPAL TUNNEL RELEASE;  Surgeon: [name], MD;  Location: [facility];  Service: Orthopedics;  Laterality: Bilateral;   COLONOSCOPY     SHOULDER OPEN ROTATOR CUFF REPAIR Bilateral 2000's   TRIGGER FINGER RELEASE Right 10/21/2013   Procedure: RELEASE TRIGGER FINGER/A-1 PULLEY RIGHT MIDDLE AND RIGHT RING;  Surgeon: [name], MD;  Location: [facility];  Service: Orthopedics;  Laterality: Right;   UPPER GASTROINTESTINAL ENDOSCOPY       Medications Prior to Admission: Prior to Admission medications   Medication Sig Start Date End Date Taking? Authorizing Provider  acetaminophen (TYLENOL) 500 MG tablet Take 1,000 mg by mouth every 6 (six) hours as needed for moderate pain. Reported on 04/30/2015   Yes [provider]  albuterol (PROVENTIL) (2.5 MG/3ML) 0.083% nebulizer solution USE 1 AMPULE EVERY 6 HOURS AS NEEDED FOR WHEEZE 10/23/17  Yes Panosh, Standley Brooking, MD  amLODipine-olmesartan (AZOR) 10-40 MG tablet Take 1 tablet by mouth daily.   Yes Madelon Lips, MD  carvedilol (COREG) 12.5 MG tablet Take 6.25 mg by mouth daily. 1/2 tablet daily   Yes Madelon Lips, MD  citalopram (CELEXA) 20 MG tablet TAKE 1 TABLET ONCE DAILY. 12/14/17  Yes Panosh, Standley Brooking, MD  clopidogrel (PLAVIX) 75 MG tablet TAKE 1 TABLET ONCE DAILY. 10/19/17  Yes Panosh, Standley Brooking, MD  diclofenac sodium (VOLTAREN) 1 % GEL APPLY 4GM TO AFFECTED AREA(S) 4 TIMES A DAY. 07/13/17  Yes Panosh, Standley Brooking, MD  doxazosin (CARDURA) 2 MG tablet TAKE 3 TABLETS AT BEDTIME. 12/13/17  Yes Panosh, Standley Brooking, MD  fenofibrate 160 MG tablet TAKE  1 TABLET ONCE DAILY. 12/13/17  Yes Panosh, Standley Brooking, MD  ferrous sulfate 325 (65 FE) MG EC tablet Take 1 tablet (325 mg total) by mouth daily with breakfast. 12/15/14  Yes Panosh, Standley Brooking, MD  fluorometholone (FML) 0.1 % ophthalmic suspension Place 1 drop into both eyes 2 (two) times daily.  09/13/17  Yes [provider]  Fluticasone-Salmeterol (ADVAIR) 250-50 MCG/DOSE AEPB USE 1 PUFF TWICE DAILY. 11/09/17  Yes Panosh, Standley Brooking, MD  furosemide (LASIX) 80 MG tablet Take 80 mg by mouth daily.   Yes Madelon Lips, MD  hydrALAZINE (APRESOLINE) 25 MG tablet Take 1 tablet by mouth daily. 12/03/17  Yes [provider]  lidocaine (LIDODERM) 5 % Place 1 patch onto the skin daily. Remove & Discard patch within 12 hours or as directed by MD Patient taking differently: Place 1 patch onto the skin daily as needed (pain). Remove & Discard patch within 12 hours or as directed by MD 01/04/17  Yes [place], Orson Ape, FNP  LORazepam (ATIVAN) 1 MG tablet Take 1 tablet (1 mg total) by mouth every 8 (eight) hours as needed for anxiety. Avoid regular use 08/28/17  Yes Panosh, Standley Brooking, MD  montelukast (SINGULAIR) 10 MG tablet TAKE 1 TABLET ONCE DAILY. 10/31/17  Yes Panosh, Standley Brooking, MD  pantoprazole (PROTONIX) 40 MG tablet TAKE (1) TABLET TWICE A DAY BEFORE MEALS. 10/23/17  Yes Panosh, Standley Brooking, MD  potassium chloride (K-DUR,KLOR-CON) 10 MEQ tablet Take 10 mEq by mouth 4 (four) times daily.   Yes [provider]  PROAIR HFA 108 (90 Base) MCG/ACT inhaler USE 2 PUFFS EVERY 6 HOURS  AS NEEDED FOR WHEEZING. 12/13/17  Yes Panosh, Standley Brooking, MD  RESTASIS 0.05 % ophthalmic emulsion Place 1 drop into both eyes 2 (two) times daily.  01/01/15  Yes [provider]  Spacer/Aero-Holding Chambers (AEROCHAMBER PLUS) inhaler Use as instructed 05/26/16  Yes Melynda Ripple, MD  vitamin B-12 (CYANOCOBALAMIN) 1000 MCG tablet Take 1,000 mcg by mouth daily.   Yes [provider]     Allergies:    Allergies  Allergen Reactions   Penicillins Nausea And Vomiting and Other (See Comments)    Has patient had a PCN reaction causing immediate rash, facial/tongue/throat swelling, SOB or lightheadedness with hypotension: Y Has patient had a PCN reaction causing severe rash involving mucus membranes or skin necrosis: Y Has patient had a PCN reaction that required hospitalization: N Has patient had a PCN reaction occurring within the last 10 years: N If all of the above answers are "NO", then may proceed with Cephalosporin use.    Aspirin Other (See Comments)    Wheezing Acetaminophen is OK    Social History:   Social History   Socioeconomic History   Marital status: Single    Spouse name: Not on file   Number of children: 6   Years of education: Not on file   Highest education level: Not on file  Occupational History   Occupation: retired  Tobacco Use   Smoking status: Never   Smokeless tobacco: Never  Vaping Use   Vaping Use: Never used  Substance and Sexual Activity   Alcohol use: No   Drug use: No   Sexual activity: Not Currently    Birth control/protection: Surgical  Other Topics Concern   Not on file  Social History Narrative   2 people living in the home.  Grand daughter.   Up and down through the night      Had 7  children  2 deceased . 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    works 3 days per week currently .   Glasses dentures  Neg tad    Social Determinants of Health    Financial Resource Strain: Low Risk  (04/14/2022)   Overall Financial Resource Strain (CARDIA)    Difficulty of Paying Living Expenses: Not very hard  Food Insecurity: Not on file  Transportation Needs: No Transportation Needs (01/01/2020)   PRAPARE - Hydrologist (Medical): No    Lack of Transportation (Non-Medical): No  Physical Activity: Not on file  Stress: Not on file  Social Connections: Not on file  Intimate Partner Violence: Not on file    Family History:   The patient's family history includes Breast cancer in her brother, paternal aunt, sister, and sister; Colon cancer in her sister; Diabetes in her daughter; Esophageal cancer in her daughter; Heart attack in her daughter; Hypertension in her brother, daughter, father, and mother; Lung cancer in her father, mother, and another family member; Rectal cancer in her brother; Stomach cancer in her brother. There is no history of Pancreatic cancer.    ROS:  Please see the history of present illness.  All other ROS reviewed and negative.     Physical Exam/Data:    Physical Exam: Blood pressure (!) 155/60, pulse (!) 57, height 5' (1.524 m), weight 157 lb 9.6 oz (71.5 kg), SpO2 99 %.  HYPERTENSION CONTROL Vitals:   06/23/22 1429 06/23/22 1453  BP: (!) 166/60 (!) 155/60    The patient's blood pressure is elevated above target today.  In order to address the patient's elevated BP: Blood pressure will be monitored at home to determine if medication changes need to be made.       GEN: elderly frail  female,   in no acute distress HEENT: Normal NECK: No JVD; No carotid bruits LYMPHATICS: No lymphadenopathy CARDIAC: RRR , no murmurs, rubs, gallops RESPIRATORY:  Clear to auscultation without rales, wheezing or rhonchi  ABDOMEN: Soft, non-tender, non-distended MUSCULOSKELETAL:  2-3 + pitting edema  SKIN: Warm and dry NEUROLOGIC:  Alert and oriented x 3     EKG:       Relevant CV  Studies:   Laboratory Data:  Chemistry No results for input(s): "NA", "K", "CL", "CO2", "GLUCOSE", "BUN", "CREATININE", "CALCIUM", "GFRNONAA", "GFRAA", "ANIONGAP" in the last 168 hours.   No results for input(s): "PROT", "ALBUMIN", "AST", "ALT", "ALKPHOS", "BILITOT" in the last 168 hours.  Hematology No results for input(s): "WBC", "RBC", "HGB", "HCT", "MCV", "MCH", "MCHC", "RDW", "PLT" in the last 168 hours.  Cardiac Enzymes No results for input(s): "TROPONINI" in the last 168 hours. No results for input(s): "TROPIPOC" in the last 168 hours.  BNPNo results for input(s): "BNP", "PROBNP" in the last 168 hours.  DDimer No results for input(s): "DDIMER" in the last 168 hours.  Radiology/Studies:  No results found.  Assessment and Plan:   1.   Chronic diastolic CHF :  has significant pulmonary HTN and leg edema.   Has CKD , creatinine was last 2.8.  At this point I would like to refer her to the advanced heart failure clinic to see if they can find a better solution for her hypertension and acute on chronic diastolic congestive heart failure.  I advised her to work on getting rid of extra salt.  I given her  instructions to use the lounge doctor leg rest for leg elevation.  Continue current medications.    2.  Hypertension:    .  Blood pressure is mildly elevated.  Will refer her to the advanced heart failure clinic for their opinion on better blood pressure control and heart failure control.   3.   Pulmonary HTN:       4. Leg edema :      5.  CKD :        Signed, Mertie Moores, MD  06/23/2022 3:03 PM

## 2022-06-22 NOTE — Telephone Encounter (Signed)
Called and spoke with [facility] She is aware that the RX for [name] has been sent to her pharmacy.  Nothing further needed at time of call.

## 2022-06-23 ENCOUNTER — Encounter: Payer: Self-pay | Admitting: Cardiovascular Disease

## 2022-06-23 ENCOUNTER — Ambulatory Visit: Payer: Medicare Other | Attending: Cardiovascular Disease | Admitting: Cardiovascular Disease

## 2022-06-23 VITALS — BP 155/60 | HR 57 | Ht 60.0 in | Wt 157.6 lb

## 2022-06-23 DIAGNOSIS — I272 Pulmonary hypertension, unspecified: Secondary | ICD-10-CM | POA: Diagnosis not present

## 2022-06-23 DIAGNOSIS — I10 Essential (primary) hypertension: Secondary | ICD-10-CM

## 2022-06-23 NOTE — Patient Instructions (Addendum)
Medication Instructions:  Your physician recommends that you continue on your current medications as directed. Please refer to the Current Medication list given to you today.  *If you need a refill on your cardiac medications before your next appointment, please call your pharmacy*   Lab Work: NONE If you have labs (blood work) drawn today and your tests are completely normal, you will receive your results only by: [place] (if you have MyChart) OR A paper copy in the mail If you have any lab test that is abnormal or we need to change your treatment, we will call you to review the results.   Testing/Procedures: Ambulatory Referral to Heart Failure Clinic   Follow-Up: At [facility], you and your health needs are our priority.  As part of our continuing mission to provide you with exceptional heart care, we have created designated Provider Care Teams.  These Care Teams include your primary Cardiologist (physician) and Advanced Practice Providers (APPs -  Physician Assistants and Nurse Practitioners) who all work together to provide you with the care you need, when you need it.  We recommend signing up for the patient portal called "MyChart".  Sign up information is provided on this After Visit Summary.  MyChart is used to connect with patients for Virtual Visits (Telemedicine).  Patients are able to view lab/test results, encounter notes, upcoming appointments, etc.  Non-urgent messages can be sent to your provider as well.   To learn more about what you can do with MyChart, go to [email or web address].    Your next appointment:   6 month(s)  Provider:   Mertie Moores, MD        For your  leg edema you  should do  the following  1. Leg elevation - I recommend the Lounge Dr. Leg rest.  See below for details  2. Salt restriction  -  Use potassium chloride instead of regular salt as a salt substitute. 3. Walk regularly 4. Compression hose - [facility] [employer] store   5. Weight loss    Available on [email or web address] Or  Go to [email or web address]

## 2022-06-26 ENCOUNTER — Ambulatory Visit: Payer: Medicare Other | Admitting: Internal Medicine

## 2022-06-29 ENCOUNTER — Encounter: Payer: Self-pay | Admitting: Internal Medicine

## 2022-06-29 ENCOUNTER — Ambulatory Visit (INDEPENDENT_AMBULATORY_CARE_PROVIDER_SITE_OTHER): Payer: Medicare Other | Admitting: Internal Medicine

## 2022-06-29 ENCOUNTER — Telehealth: Payer: Self-pay

## 2022-06-29 VITALS — BP 170/66 | HR 65 | Temp 97.6°F | Ht 60.0 in | Wt 158.2 lb

## 2022-06-29 DIAGNOSIS — Z0000 Encounter for general adult medical examination without abnormal findings: Secondary | ICD-10-CM | POA: Diagnosis not present

## 2022-06-29 DIAGNOSIS — I10 Essential (primary) hypertension: Secondary | ICD-10-CM | POA: Diagnosis not present

## 2022-06-29 DIAGNOSIS — I130 Hypertensive heart and chronic kidney disease with heart failure and stage 1 through stage 4 chronic kidney disease, or unspecified chronic kidney disease: Secondary | ICD-10-CM | POA: Diagnosis not present

## 2022-06-29 DIAGNOSIS — Z79899 Other long term (current) drug therapy: Secondary | ICD-10-CM

## 2022-06-29 DIAGNOSIS — D649 Anemia, unspecified: Secondary | ICD-10-CM

## 2022-06-29 DIAGNOSIS — R7301 Impaired fasting glucose: Secondary | ICD-10-CM

## 2022-06-29 DIAGNOSIS — N184 Chronic kidney disease, stage 4 (severe): Secondary | ICD-10-CM | POA: Diagnosis not present

## 2022-06-29 NOTE — Progress Notes (Signed)
Chief Complaint  Patient presents with   Annual Exam    HPI: Patient  [name]  89 y.o. comes in today for Preventive Health Care visit  CV :referred to CHF clinicn for help with medication management . BP  not at goal .  Ortho  knees   injection and fluid .  Neg clot  r knee  and prednisone  walker sometime Dr Hollie Salk wants to do  updated labs.  And send to here Occasional use of ativan about 3 per month  has had multiple deaths in family in past year.  Respiratory stable no recent asthma sob flare Aanemia  getting "shots"  no bleeding     Health Maintenance  Topic Date Due   FOOT EXAM  Never done   DEXA SCAN  Never done   OPHTHALMOLOGY EXAM  06/10/2021   COVID-19 Vaccine (7 - 2023-24 season) 08/08/2022   HEMOGLOBIN A1C  12/28/2022   Medicare Annual Wellness (AWV)  04/28/2023   DTaP/Tdap/Td (2 - Tdap) 04/03/2024   Pneumonia Vaccine 46+ Years old  Completed   INFLUENZA VACCINE  Completed   Zoster Vaccines- Shingrix  Completed   HPV VACCINES  Aged Out   Health Maintenance Review LIFESTYLE:  Exercise:   ambulatory  has cane no fall  Tobacco/ETS: n Alcohol: n Sugar beverages:n Drug use: no   ROS:  GEN/ HEENT: No fever, significant weight changes sweats headaches vision changes  hearing changes, GI /GU: No adominal pain, vomiting, change in bowel habits. No blood in the stool.  SKIN/HEME: ,no acute skin rashes suspicious lesions or bleeding. No lymphadenopathy, nodules, masses.  NEURO/ PSYCH:  No neurologic signs such as weakness numbness.  IMM/ Allergy: No unusual infections.  Allergy .   REST of 12 system review negative except as per HPI   Past Medical History:  Diagnosis Date   Acute superficial venous thrombosis of left lower extremity 03/27/2014   concern about hx and potential extensive on exam tenderness calf and low dose lovenox 40 qd 2-4 weekselevetion and close  fu advised .    Allergy    Anemia    Anxiety    Arthritis    "shoulders"  (09/09/2012)   Asthma 09/08/2012   Carotid bruit    [place] 2018  low risk 1 - 39%   Cataract    bil cateracts removed   Chronic kidney disease    Clotting disorder (HCC)    blood clots in legs   Diabetes mellitus without complication ([place])    "Borderline" per pt; being monitored.  no meds per pt   Diverticulosis    Dyspnea 04/27/2014   GERD (gastroesophageal reflux disease)    Headache(784.0)    "related to my high blood pressure" (09/09/2012)   Hiatal hernia    History of DVT (deep vein thrombosis)    "RLE" (09/09/2012) coumadine cant take asa so on plavix    HTN (hypertension) 09/08/2012   Hyperlipidemia    Lupus ([place])    "cured years ago" (09/09/2012)   Other dysphagia 02/11/2013   Syncope and collapse 01/21/2018   Varicose veins    Varicose veins of leg with complications [id number]    Past Surgical History:  Procedure Laterality Date   ABDOMINAL HYSTERECTOMY     partial   BREAST EXCISIONAL BIOPSY Right    BREAST SURGERY     CARPAL TUNNEL RELEASE Right 2000's   CARPAL TUNNEL RELEASE Bilateral 10/21/2013   Procedure: BILATERAL  CARPAL TUNNEL RELEASE;  Surgeon: Dominica Severin  Beola Cord, MD;  Location: [facility];  Service: Orthopedics;  Laterality: Bilateral;   COLONOSCOPY     SHOULDER OPEN ROTATOR CUFF REPAIR Bilateral 2000's   TRIGGER FINGER RELEASE Right 10/21/2013   Procedure: RELEASE TRIGGER FINGER/A-1 PULLEY RIGHT MIDDLE AND RIGHT RING;  Surgeon: [name], MD;  Location: [facility];  Service: Orthopedics;  Laterality: Right;   UPPER GASTROINTESTINAL ENDOSCOPY      Family History  Problem Relation Age of Onset   Hypertension Mother    Lung cancer Mother    Hypertension Father    Lung cancer Father    Breast cancer Sister    Colon cancer Sister        ? 76' s dx - died in 49's   Breast cancer Sister    Hypertension Brother    Rectal cancer Brother    Stomach cancer Brother    Breast cancer Brother    Heart attack Daughter    Esophageal  cancer Daughter    Hypertension Daughter    Diabetes Daughter    Breast cancer Paternal Aunt    Lung cancer Other        both parents    Pancreatic cancer Neg Hx     Social History   Socioeconomic History   Marital status: Single    Spouse name: Not on file   Number of children: 6   Years of education: Not on file   Highest education level: Not on file  Occupational History   Occupation: retired  Tobacco Use   Smoking status: Never   Smokeless tobacco: Never  Vaping Use   Vaping Use: Never used  Substance and Sexual Activity   Alcohol use: No   Drug use: No   Sexual activity: Not Currently    Birth control/protection: Surgical  Other Topics Concern   Not on file  Social History Narrative   2 people living in the home.  Grand daughter.   Up and down through the night      Had 7 children  2 deceased . 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    works 3 days per week currently .   Glasses dentures  Neg tad    Social Determinants of Health   Financial Resource Strain: Low Risk  (04/14/2022)   Overall Financial Resource Strain (CARDIA)    Difficulty of Paying Living Expenses: Not very hard  Food Insecurity: Not on file  Transportation Needs: No Transportation Needs (01/01/2020)   PRAPARE - Hydrologist (Medical): No    Lack of Transportation (Non-Medical): No  Physical Activity: Not on file  Stress: Not on file  Social Connections: Not on file    Outpatient Medications Prior to Visit  Medication Sig Dispense Refill   acetaminophen (TYLENOL) 500 MG tablet Take 500 mg by mouth in the morning and at bedtime.     albuterol (PROVENTIL) (2.5 MG/3ML) 0.083% nebulizer solution Take 3 mLs (2.5 mg total) by nebulization every 6 (six) hours as needed for shortness of breath or wheezing. 360 mL 6   albuterol (VENTOLIN HFA) 108 (90 Base) MCG/ACT inhaler USE 2 PUFFS EVERY 6  HOURS AS NEEDED FOR WHEEZING. 8.5 g 3   amLODipine (NORVASC) 5 MG tablet Take 1 tablet (5 mg total) by mouth daily. 90 tablet 2   atorvastatin (LIPITOR) 40 MG tablet Take 1  tablet (40 mg total) by mouth daily. 90 tablet 3   azaTHIOprine (IMURAN) 50 MG tablet Take 25 mg by mouth at bedtime.     Azelastine-Fluticasone 137-50 MCG/ACT SUSP PLACE ONE SPRAY IN EACH NOSTRIL TWICE DAILY (IN THE MORNING AND AT BEDTIME) 23 g 6   Budeson-Glycopyrrol-Formoterol (BREZTRI AEROSPHERE) 160-9-4.8 MCG/ACT AERO INHALE 2 PUFFS INTO THE LUNGS TWICE DAILY, IN THE MORNING & AT BEDTIME. 10.7 g 4   carvedilol (COREG) 6.25 MG tablet Take 6.25 mg by mouth daily.     cetirizine (ZYRTEC) 10 MG tablet Take 10 mg by mouth daily.     citalopram (CELEXA) 20 MG tablet Take 1 tablet (20 mg total) by mouth every evening. 90 tablet 0   cloNIDine (CATAPRES) 0.1 MG tablet Take 1 tablet every 12 hours as needed for blood pressures greater than 99991111 systolic (Patient taking differently: Take 0.1 mg by mouth daily.) 15 tablet 0   clopidogrel (PLAVIX) 75 MG tablet TAKE ONE TABLET BY MOUTH ONCE DAILY. 30 tablet 1   diclofenac Sodium (VOLTAREN) 1 % GEL Apply 4 g topically 2 (two) times daily as needed (painful areas). 50 g 1   doxazosin (CARDURA) 2 MG tablet Take 3 tablets (6 mg total) by mouth at bedtime. 270 tablet 1   ferrous sulfate 325 (65 FE) MG EC tablet Take 1 tablet (325 mg total) by mouth daily with breakfast. 30 tablet 3   furosemide (LASIX) 40 MG tablet Take 40-80 mg by mouth See admin instructions. 80 mg in the am and 40 mg in the pm     hydrALAZINE (APRESOLINE) 50 MG tablet Take 1&1/2 tablets (75 mg total) by mouth 3 (three) times daily. 405 tablet 1   LORazepam (ATIVAN) 0.5 MG tablet TAKE ONE TABLET BY MOUTH TWICE DAILY AS NEEDED FOR ANXIETY 30 tablet 0   montelukast (SINGULAIR) 10 MG tablet TAKE ONE TABLET BY MOUTH ONCE DAILY 90 tablet 0   ondansetron (ZOFRAN-ODT) 8 MG disintegrating tablet 75m ODT q4 hours prn nausea 20 tablet  0   pantoprazole (PROTONIX) 40 MG tablet Take 1 tablet (40 mg total) by mouth 2 (two) times daily. 180 tablet 1   potassium chloride (KLOR-CON) 10 MEQ tablet TAKE FOUR TABLETS BY MOUTH DAILY. 120 tablet 3   RESTASIS 0.05 % ophthalmic emulsion 1 drop 2 (two) times daily.     Spacer/Aero-Holding Chambers (AEROCHAMBER PLUS) inhaler Use as instructed 1 each 2   vitamin B-12 (CYANOCOBALAMIN) 1000 MCG tablet Take 1,000 mcg by mouth daily.     Vitamin D, Cholecalciferol, 25 MCG (1000 UT) TABS Take 1,000 Units by mouth daily.     fluticasone (FLONASE) 50 MCG/ACT nasal spray Place 2 sprays into both nostrils daily. 16 g 2   No facility-administered medications prior to visit.     EXAM:  BP (!) 170/66 (BP Location: Right Arm, Patient Position: Sitting, Cuff Size: Large)   Pulse 65   Temp 97.6 F (36.4 C) (Oral)   Ht 5' (1.524 m)   Wt 158 lb 3.2 oz (71.8 kg)   SpO2 97%   BMI 30.90 kg/m   Body mass index is 30.9 kg/m. Wt Readings from Last 3 Encounters:  06/29/22 158 lb 3.2 oz (71.8 kg)  06/23/22 157 lb 9.6 oz (71.5 kg)  06/08/22 159 lb 3.2 oz (72.2 kg)    Physical Exam: Vital signs reviewed [id number] a well-developed well-nourished alert cooperative    who appearsr stated age in no acute distress.  HEENT: normocephalic atraumatic , Eyes:  PERRL EOM's full, conjunctiva muddy  drops Nares: paten,t no deformity discharge or tenderness., Ears: no deformity EAC's clear TMs with normal landmarks. Mouth: masked  NECK: supple without masses, thyromegaly or bruits. CHEST/PULM:  Clear to auscultation and percussion breath sounds equal no wheeze , rales or rhonchi. No chest wall deformities or tenderness. Breast: normal by inspection . No dimpling, discharge, masses, tenderness or discharge . CV: PMI is nondisplaced, S1 S2 no gallops, murmurs, rubs. Peripheral pulses are present  ABDOMEN: Bowel sounds normal nontender  No guard or rebound, no hepato splenomegal no CVA tenderness.  No  hernia. Extremtities:  No clubbing cyanosis  in compression stockings an  not removed  2+  edema, no acute joint swelling  djd changes  NEURO:  Oriented x3, cranial nerves 3-12 appear to be intact, no obvious focal weakness,gait within normal limits no abnormal reflexes or asymmetrical SKIN: No acute rashes normal turgor, color, no bruising or petechiae. PSYCH: Oriented, good eye contact, no obvious depression anxiety, cognition and judgment appear normal. LN: no cervical axillary inguinal adenopathy  Lab Results  Component Value Date   WBC 3.5 (L) 06/08/2022   HGB 10.0 (L) 06/08/2022   HCT 30.7 (L) 06/08/2022   PLT 141 (L) 06/08/2022   GLUCOSE 104 (H) 06/29/2022   CHOL 192 06/29/2022   TRIG 87.0 06/29/2022   HDL 85.80 06/29/2022   LDLCALC 89 06/29/2022   ALT 10 06/29/2022   AST 21 06/29/2022   NA 140 06/29/2022   K 4.3 06/29/2022   CL 106 06/29/2022   CREATININE 2.94 (H) 06/29/2022   BUN 49 (H) 06/29/2022   CO2 25 06/29/2022   TSH 2.85 06/29/2022   INR 0.94 12/22/2009   HGBA1C 5.3 06/29/2022   MICROALBUR 119 03/02/2021    BP Readings from Last 3 Encounters:  06/29/22 (!) 170/66  06/23/22 (!) 155/60  06/08/22 (!) 166/58    Lab plan  updated reviewed with patient   ASSESSMENT AND PLAN:  Discussed the following assessment and plan:    ICD-10-CM   1. Well adult exam  Z00.00     2. Chronic kidney disease (CKD), stage IV (severe) (HCC)  [id number] Basic metabolic panel    Hemoglobin A1c    Hepatic function panel    TSH    Lipid panel    3. Essential hypertension  [id number] Basic metabolic panel    Hemoglobin A1c    Hepatic function panel    TSH    Lipid panel   not controlled  multiple meds    4. Medication management  [id number] Basic metabolic panel    Hemoglobin A1c    Hepatic function panel    TSH    Lipid panel    5. Fasting hyperglycemia  [id number] Basic metabolic panel    Hemoglobin A1c    Hepatic function panel    TSH    Lipid panel    6. Anemia, unspecified  type  0000000 Basic metabolic panel    Hemoglobin A1c    Hepatic function panel    TSH    Lipid panel   felt anemia of chornic disease  ckd    7. Hypertensive heart disease with congestive heart failure and chronic kidney disease, unspecified CKD stage, unspecified heart failure type (HCC)  [id number] Basic metabolic panel    Hemoglobin A1c    Hepatic function panel    TSH    Lipid panel   w pulm ht    Update lab panel today Cv meds as per  cards renal  Disc fu  consider doing daily weights  Fu depending  on labs 4-6 months   Return for depending on results 4-6 months.  Patient Care Team: Lakeidra Reliford, Standley Brooking, MD as PCP - General (Internal Medicine) Nahser, Wonda Cheng, MD as PCP - Cardiology (Cardiology) Marlou Sa Tonna Corner, MD (Orthopedic Surgery) Truitt Merle, MD as Consulting Physician (Hematology) Madelon Lips, MD as Consulting Physician (Nephrology) Monna Fam, MD as Consulting Physician (Ophthalmology) Viona Gilmore, [facility] (Inactive) as Pharmacist (Pharmacist) Tobi Bastos, RN as [facility] Feliz Beam, MD as Referring Physician (Ophthalmology) Collene Gobble, MD as Consulting Physician (Pulmonary Disease) Patient Instructions  Good to see you today.  Continue to avoid  excess sodium  Will send results of lab to your medical team including DR Hollie Salk   Agree with seeing if heart failure clinic can help with optimizing including medication.  Standley Brooking. Matther Labell M.D.

## 2022-06-29 NOTE — Progress Notes (Unsigned)
Care Management & Coordination Services Pharmacy Team  Reason for Encounter: Hypertension  Contacted patient to discuss hypertension disease state. {US HC Outreach:[id number]}   Current antihypertensive regimen:  Amlodipine 5 mg daily Doxazosin 2 mg 3 tablets at bedtime Carvedilol 6.25 mg daily Hydralazine 50 mg 1.5 tablets three times daily Furosemide 40 mg 2 tablets in AM and 1 tablet in PM  Patient verbally confirms she is taking the above medications as directed. Yes  How often are you checking your Blood Pressure? Patient is checking twice daily  she checks her blood pressure at various times  Current home BP readings:  DATE:             BP               PULSE   Wrist or arm cuff: Arm cuff  OTC medications including pseudoephedrine or NSAIDs? Patients daughter denies  Any readings above 180/100? {yes/no:20286} If yes any symptoms of hypertensive emergency? {hypertensive emergency symptoms:25354}  What recent interventions/DTPs have been made by any provider to improve Blood Pressure control since last CPP Visit: No recent interventions  Any recent hospitalizations or ED visits since last visit with CPP? Patient was seen at [facility] on 05/02/2022 (1 hour) due to chronic pain of right knee and arthritis of right knee.  What diet changes have been made to improve Blood Pressure Control?  Patient follows no specific diet, lower salt Breakfast - patient will have boiled egg,and toast or oatmeal Lunch - patient will have a variety Dinner - patient will have a meal that contains a meat and vegetables Snack - patient will have occasional snack of fruit or crackers Caffeine intake -  Salt intake - patient uses very little salt on her food  What exercise is being done to improve your Blood Pressure Control?  Patients granddaughter states will go for a walk with a friend, she will do housework and their apartment is on the upper level so she does have to climb  stairs.   Adherence Review: Is the patient currently on ACE/ARB medication? No Does the patient have >5 day gap between last estimated fill dates? No  Care Gaps: AWV - completed 04/27/2022 Next appointment - scheduled 10/23/2022 Foot exam - never done Dexa scan - never done Eye exam - overdue, last done 06/10/2020 Covid - overdue   Star Rating Drug: Rosuvastatin 40 mg - last filled 04/21/2022 90 DS at [facility]   Chart Updates: Recent office visits:  04/27/2022 Colin Benton DO - Patient was seen for Medicare annual wellness visit and an additional concern. No medication changes.  Recent consult visits:  06/23/2022 Mertie Moores MD(cardiology) - Patient was seen for Pulmonary hypertension, unspecified and hypertension. Discontinued prednisolone drops.   06/20/2022 Dwana Melena MD (eye center) - Patient was seen for follow up for panuveitis of both eyes. No medication changes.   06/08/2022 Truitt Merle MD (oncology) - Patient was seen for Anemia of chronic disease. No medication changes.   05/16/2022 Rod Can MD (ortho) - Patient was seen for pain of right knee joint. No additional chart notes.   05/12/2022 Nuala Alpha PA (ortho) - Patient was seen for pain in lower limb and right knee joint. No additional chart notes.   Hospital visits:  Patient was seen at [facility] on 05/02/2022 (1 hour) due to chronic pain of right knee and arthritis of right knee.    New?Medications Started at [facility] Discharge:?? Voltarten Gel 1% Medication Changes  at Hospital Discharge: No medication changes Medications Discontinued at Hospital Discharge: No medications discontinued Medications that remain the same after Hospital Discharge:??  -All other medications will remain the same.    Medications: Outpatient Encounter Medications as of 06/29/2022  Medication Sig   acetaminophen (TYLENOL) 500 MG tablet Take 500 mg by mouth in the morning and at bedtime.   albuterol (PROVENTIL)  (2.5 MG/3ML) 0.083% nebulizer solution Take 3 mLs (2.5 mg total) by nebulization every 6 (six) hours as needed for shortness of breath or wheezing.   albuterol (VENTOLIN HFA) 108 (90 Base) MCG/ACT inhaler USE 2 PUFFS EVERY 6 HOURS AS NEEDED FOR WHEEZING.   amLODipine (NORVASC) 5 MG tablet Take 1 tablet (5 mg total) by mouth daily.   atorvastatin (LIPITOR) 40 MG tablet Take 1 tablet (40 mg total) by mouth daily.   azaTHIOprine (IMURAN) 50 MG tablet Take 25 mg by mouth at bedtime.   Azelastine-Fluticasone 137-50 MCG/ACT SUSP PLACE ONE SPRAY IN EACH NOSTRIL TWICE DAILY (IN THE MORNING AND AT BEDTIME)   Budeson-Glycopyrrol-Formoterol (BREZTRI AEROSPHERE) 160-9-4.8 MCG/ACT AERO INHALE 2 PUFFS INTO THE LUNGS TWICE DAILY, IN THE MORNING & AT BEDTIME.   carvedilol (COREG) 6.25 MG tablet Take 6.25 mg by mouth daily.   cetirizine (ZYRTEC) 10 MG tablet Take 10 mg by mouth daily.   citalopram (CELEXA) 20 MG tablet Take 1 tablet (20 mg total) by mouth every evening.   cloNIDine (CATAPRES) 0.1 MG tablet Take 1 tablet every 12 hours as needed for blood pressures greater than 99991111 systolic (Patient taking differently: Take 0.1 mg by mouth daily.)   clopidogrel (PLAVIX) 75 MG tablet TAKE ONE TABLET BY MOUTH ONCE DAILY.   diclofenac Sodium (VOLTAREN) 1 % GEL Apply 4 g topically 2 (two) times daily as needed (painful areas).   doxazosin (CARDURA) 2 MG tablet Take 3 tablets (6 mg total) by mouth at bedtime.   ferrous sulfate 325 (65 FE) MG EC tablet Take 1 tablet (325 mg total) by mouth daily with breakfast.   fluticasone (FLONASE) 50 MCG/ACT nasal spray Place 2 sprays into both nostrils daily.   furosemide (LASIX) 40 MG tablet Take 40-80 mg by mouth See admin instructions. 80 mg in the am and 40 mg in the pm   hydrALAZINE (APRESOLINE) 50 MG tablet Take 1&1/2 tablets (75 mg total) by mouth 3 (three) times daily.   LORazepam (ATIVAN) 0.5 MG tablet TAKE ONE TABLET BY MOUTH TWICE DAILY AS NEEDED FOR ANXIETY   montelukast  (SINGULAIR) 10 MG tablet TAKE ONE TABLET BY MOUTH ONCE DAILY   ondansetron (ZOFRAN-ODT) 8 MG disintegrating tablet 66m ODT q4 hours prn nausea   pantoprazole (PROTONIX) 40 MG tablet Take 1 tablet (40 mg total) by mouth 2 (two) times daily.   potassium chloride (KLOR-CON) 10 MEQ tablet TAKE FOUR TABLETS BY MOUTH DAILY.   RESTASIS 0.05 % ophthalmic emulsion 1 drop 2 (two) times daily.   Spacer/Aero-Holding Chambers (AEROCHAMBER PLUS) inhaler Use as instructed   vitamin B-12 (CYANOCOBALAMIN) 1000 MCG tablet Take 1,000 mcg by mouth daily.   Vitamin D, Cholecalciferol, 25 MCG (1000 UT) TABS Take 1,000 Units by mouth daily.   No facility-administered encounter medications on file as of 06/29/2022.  Fill History:   Dispensed Days Supply Quantity Provider Pharmacy  albuterol sulfate HFA 90 mcg/actuation aerosol inhaler 06/01/2022 25 8.5 g      Dispensed Days Supply Quantity Provider Pharmacy  amlodipine 5 mg tablet 06/02/2022 90 90 each      Dispensed Days Supply  Quantity Provider Pharmacy  atorvastatin 40 mg tablet 04/21/2022 90 90 each      Dispensed Days Supply Quantity Provider Pharmacy  azathioprine 50 mg tablet 06/20/2022 60 30 each      Dispensed Days Supply Quantity Provider Pharmacy  azelastine 137 mcg-fluticasone 50 mcg/spray nasal spray 06/02/2022 30 23 g      Dispensed Days Supply Quantity Provider Pharmacy  BREZTRI AEROSPHERE  160-9-4.8 MCG/ACT AERO 06/22/2022 30 10.7 g      Dispensed Days Supply Quantity Provider Pharmacy  carvedilol 6.25 mg tablet 06/01/2022 90 90 each      Dispensed Days Supply Quantity Provider Pharmacy  citalopram 20 mg tablet 05/18/2022 90 90 each      Dispensed Days Supply Quantity Provider Pharmacy  clonidine HCl 0.1 mg tablet 09/10/2021 7 15 each      Dispensed Days Supply Quantity Provider Pharmacy  clopidogrel 75 mg tablet 06/17/2022 30 30 each      Dispensed Days Supply Quantity Provider Pharmacy  doxazosin 2 mg tablet 06/17/2022 30 90 each       Dispensed Days Supply Quantity Provider Pharmacy  FLUTICASONE PROP 50 MCG SPRAY 10/20/2019 30 16 each      Dispensed Days Supply Quantity Provider Pharmacy  furosemide 40 mg tablet 06/01/2022 90 270 each      Dispensed Days Supply Quantity Provider Pharmacy  hydralazine 50 mg tablet 06/17/2022 30 135 each      Dispensed Days Supply Quantity Provider Pharmacy  lorazepam 0.5 mg tablet 05/18/2022 15 30 each      Dispensed Days Supply Quantity Provider Pharmacy  montelukast 10 mg tablet 04/11/2022 90 90 each      Dispensed Days Supply Quantity Provider Pharmacy  ondansetron 8 mg disintegrating tablet 09/12/2021 8 20 each      Dispensed Days Supply Quantity Provider Pharmacy  pantoprazole 40 mg tablet,delayed release 05/16/2022 90 180 each      Dispensed Days Supply Quantity Provider Pharmacy  potassium chloride ER 10 mEq tablet,extended release 05/16/2022 30 120 each Panosh, [name],      Recent Office Vitals: BP Readings from Last 3 Encounters:  06/23/22 (!) 155/60  06/08/22 (!) 166/58  05/02/22 (!) 191/61   Pulse Readings from Last 3 Encounters:  06/23/22 (!) 57  06/08/22 96  06/08/22 71    Wt Readings from Last 3 Encounters:  06/23/22 157 lb 9.6 oz (71.5 kg)  06/08/22 159 lb 3.2 oz (72.2 kg)  04/27/22 144 lb 6.4 oz (65.5 kg)     Kidney Function Lab Results  Component Value Date/Time   CREATININE 2.8 (A) 11/16/2021 12:00 AM   CREATININE 2.34 (H) 09/12/2021 12:27 AM   CREATININE 2.38 (H) 09/09/2021 06:39 PM   CREATININE 2.31 (H) 03/10/2020 11:49 AM   CREATININE 1.4 (H) 06/19/2016 01:01 PM   CREATININE 1.6 (H) 08/16/2015 11:36 AM   GFR 19.22 (L) 06/29/2021 02:15 PM   GFRNONAA 20 (L) 09/12/2021 12:27 AM   GFRNONAA 19 (L) 03/10/2020 11:49 AM   GFRAA 19 11/04/2020 12:00 AM   GFRAA 22 (L) 03/10/2020 11:49 AM       Latest Ref Rng & Units 11/16/2021   12:00 AM 09/12/2021   12:27 AM 09/09/2021    6:39 PM  BMP  Glucose 70 - 99 mg/dL  128  113   BUN 4 - 21 41      35  32   Creatinine 0.5 - 1.1 2.8     2.34  2.38   Sodium 137 - 147 138  138  138   Potassium 3.5 - 5.1 mEq/L 3.7     4.0  3.6   Chloride 99 - 108 103     104  108   CO2 22 - 32 mmol/L  23  25   Calcium 8.7 - 10.7 9.3     9.1  8.8      This result is from an external source.   [facility] Pharmacist Assistant [phone]

## 2022-06-29 NOTE — Patient Instructions (Signed)
Good to see you today.  Continue to avoid  excess sodium  Will send results of lab to your medical team including DR Hollie Salk   Agree with seeing if heart failure clinic can help with optimizing including medication.

## 2022-06-30 ENCOUNTER — Telehealth: Payer: Self-pay

## 2022-06-30 LAB — HEPATIC FUNCTION PANEL
ALT: 10 U/L (ref 0–35)
AST: 21 U/L (ref 0–37)
Albumin: 3.6 g/dL (ref 3.5–5.2)
Alkaline Phosphatase: 44 U/L (ref 39–117)
Bilirubin, Direct: 0.1 mg/dL (ref 0.0–0.3)
Total Bilirubin: 0.5 mg/dL (ref 0.2–1.2)
Total Protein: 6.1 g/dL (ref 6.0–8.3)

## 2022-06-30 LAB — LIPID PANEL
Cholesterol: 192 mg/dL (ref 0–200)
HDL: 85.8 mg/dL (ref >39.00)
LDL Cholesterol: 89 mg/dL (ref 0–99)
NonHDL: 106.06
Total CHOL/HDL Ratio: 2
Triglycerides: 87 mg/dL (ref 0.0–149.0)
VLDL: 17.4 mg/dL (ref 0.0–40.0)

## 2022-06-30 LAB — BASIC METABOLIC PANEL
BUN: 49 mg/dL — ABNORMAL HIGH (ref 6–23)
CO2: 25 mEq/L (ref 19–32)
Calcium: 9.2 mg/dL (ref 8.4–10.5)
Chloride: 106 mEq/L (ref 96–112)
Creatinine, Ser: 2.94 mg/dL — ABNORMAL HIGH (ref 0.40–1.20)
GFR: 13.99 mL/min — CL (ref >60.00)
Glucose, Bld: 104 mg/dL — ABNORMAL HIGH (ref 70–99)
Potassium: 4.3 mEq/L (ref 3.5–5.1)
Sodium: 140 mEq/L (ref 135–145)

## 2022-06-30 LAB — TSH: TSH: 2.85 u[IU]/mL (ref 0.35–5.50)

## 2022-06-30 LAB — HEMOGLOBIN A1C: Hgb A1c MFr Bld: 5.3 % (ref 4.6–6.5)

## 2022-06-30 NOTE — Telephone Encounter (Addendum)
CRITICAL VALUE STICKER  CRITICAL VALUE:  GFR of 13.99  RECEIVER (on-site recipient of call):  DATE & TIME NOTIFIED:   MESSENGER (representative from lab): Saa from [facility]  MD NOTIFIED: Dr. Sarajane Jews  TIME OF NOTIFICATION:  RESPONSE:

## 2022-06-30 NOTE — Telephone Encounter (Signed)
She has chronic kidney disease so this can wait for Dr. Regis Bill to look at

## 2022-07-03 NOTE — Progress Notes (Signed)
Kidney function  some worse  creatinine 2.94  blood sugar in control  Liver cholesterol thyroid  all ok.  Will forward info to Dr Hollie Salk  will need  closer follow up of kidney function ad per her advice  Fu with me in 4 months

## 2022-07-10 ENCOUNTER — Inpatient Hospital Stay: Payer: Medicare Other | Attending: Hematology

## 2022-07-10 ENCOUNTER — Other Ambulatory Visit: Payer: Self-pay

## 2022-07-10 ENCOUNTER — Other Ambulatory Visit: Payer: Self-pay | Admitting: Hematology

## 2022-07-10 ENCOUNTER — Inpatient Hospital Stay: Payer: Medicare Other

## 2022-07-10 VITALS — BP 179/56 | HR 56 | Temp 98.4°F | Resp 18

## 2022-07-10 DIAGNOSIS — D631 Anemia in chronic kidney disease: Secondary | ICD-10-CM | POA: Insufficient documentation

## 2022-07-10 DIAGNOSIS — N184 Chronic kidney disease, stage 4 (severe): Secondary | ICD-10-CM | POA: Diagnosis not present

## 2022-07-10 DIAGNOSIS — D638 Anemia in other chronic diseases classified elsewhere: Secondary | ICD-10-CM

## 2022-07-10 DIAGNOSIS — I129 Hypertensive chronic kidney disease with stage 1 through stage 4 chronic kidney disease, or unspecified chronic kidney disease: Secondary | ICD-10-CM | POA: Diagnosis not present

## 2022-07-10 DIAGNOSIS — Z79899 Other long term (current) drug therapy: Secondary | ICD-10-CM | POA: Diagnosis not present

## 2022-07-10 LAB — CBC WITH DIFFERENTIAL (CANCER CENTER ONLY)
Abs Immature Granulocytes: 0.01 10*3/uL (ref 0.00–0.07)
Basophils Absolute: 0 10*3/uL (ref 0.0–0.1)
Basophils Relative: 1 %
Eosinophils Absolute: 0.1 10*3/uL (ref 0.0–0.5)
Eosinophils Relative: 2 %
HCT: 31.9 % — ABNORMAL LOW (ref 36.0–46.0)
Hemoglobin: 10.4 g/dL — ABNORMAL LOW (ref 12.0–15.0)
Immature Granulocytes: 0 %
Lymphocytes Relative: 30 %
Lymphs Abs: 0.9 10*3/uL (ref 0.7–4.0)
MCH: 29.8 pg (ref 26.0–34.0)
MCHC: 32.6 g/dL (ref 30.0–36.0)
MCV: 91.4 fL (ref 80.0–100.0)
Monocytes Absolute: 0.3 10*3/uL (ref 0.1–1.0)
Monocytes Relative: 10 %
Neutro Abs: 1.8 10*3/uL (ref 1.7–7.7)
Neutrophils Relative %: 57 %
Platelet Count: 165 10*3/uL (ref 150–400)
RBC: 3.49 MIL/uL — ABNORMAL LOW (ref 3.87–5.11)
RDW: 14.6 % (ref 11.5–15.5)
WBC Count: 3.1 10*3/uL — ABNORMAL LOW (ref 4.0–10.5)
nRBC: 0 % (ref 0.0–0.2)

## 2022-07-10 MED ORDER — DARBEPOETIN ALFA 100 MCG/0.5ML IJ SOSY
100.0000 ug | PREFILLED_SYRINGE | Freq: Once | INTRAMUSCULAR | Status: AC
  Administered 2022-07-10: 100 ug via SUBCUTANEOUS
  Filled 2022-07-10: qty 0.5

## 2022-07-10 NOTE — Patient Instructions (Signed)

## 2022-07-21 ENCOUNTER — Ambulatory Visit: Payer: Medicare Other | Attending: Cardiovascular Disease

## 2022-07-21 DIAGNOSIS — Z79899 Other long term (current) drug therapy: Secondary | ICD-10-CM

## 2022-07-21 DIAGNOSIS — E782 Mixed hyperlipidemia: Secondary | ICD-10-CM

## 2022-07-22 LAB — LIPID PANEL
Chol/HDL Ratio: 2.1 ratio (ref 0.0–4.4)
Cholesterol, Total: 190 mg/dL (ref 100–199)
HDL: 92 mg/dL (ref >39)
LDL Chol Calc (NIH): 81 mg/dL (ref 0–99)
Triglycerides: 97 mg/dL (ref 0–149)
VLDL Cholesterol Cal: 17 mg/dL (ref 5–40)

## 2022-07-22 LAB — ALT: ALT: 9 IU/L (ref 0–32)

## 2022-08-03 ENCOUNTER — Ambulatory Visit
Admission: EM | Admit: 2022-08-03 | Discharge: 2022-08-03 | Disposition: A | Discharge status: Home / Self Care | Payer: Medicare Other | Attending: Internal Medicine | Admitting: Internal Medicine

## 2022-08-03 ENCOUNTER — Ambulatory Visit (INDEPENDENT_AMBULATORY_CARE_PROVIDER_SITE_OTHER): Payer: Medicare Other

## 2022-08-03 DIAGNOSIS — Z1152 Encounter for screening for COVID-19: Secondary | ICD-10-CM | POA: Diagnosis not present

## 2022-08-03 DIAGNOSIS — R0602 Shortness of breath: Secondary | ICD-10-CM

## 2022-08-03 DIAGNOSIS — R600 Localized edema: Secondary | ICD-10-CM

## 2022-08-03 DIAGNOSIS — R059 Cough, unspecified: Secondary | ICD-10-CM | POA: Diagnosis not present

## 2022-08-03 DIAGNOSIS — J4541 Moderate persistent asthma with (acute) exacerbation: Secondary | ICD-10-CM | POA: Diagnosis not present

## 2022-08-03 DIAGNOSIS — I517 Cardiomegaly: Secondary | ICD-10-CM | POA: Insufficient documentation

## 2022-08-03 MED ORDER — ALBUTEROL SULFATE (2.5 MG/3ML) 0.083% IN NEBU: 2.5000 mg | INHALATION_SOLUTION | Freq: Four times a day (QID) | RESPIRATORY_TRACT | 6 refills | Status: DC | PRN

## 2022-08-03 MED ORDER — IPRATROPIUM-ALBUTEROL 0.5-2.5 (3) MG/3ML IN SOLN
3.0000 mL | Freq: Once | RESPIRATORY_TRACT | Status: AC
  Administered 2022-08-03: 3 mL via RESPIRATORY_TRACT

## 2022-08-03 MED ORDER — DEXAMETHASONE SODIUM PHOSPHATE 10 MG/ML IJ SOLN
5.0000 mg | Freq: Once | INTRAMUSCULAR | Status: AC
  Administered 2022-08-03: 5 mg via INTRAMUSCULAR

## 2022-08-03 MED ORDER — METHYLPREDNISOLONE SODIUM SUCC 125 MG IJ SOLR: 60.0000 mg | Freq: Once | INTRAMUSCULAR | Status: DC

## 2022-08-03 MED ORDER — ALBUTEROL SULFATE (2.5 MG/3ML) 0.083% IN NEBU
2.5000 mg | INHALATION_SOLUTION | Freq: Once | RESPIRATORY_TRACT | Status: AC
  Administered 2022-08-03: 2.5 mg via RESPIRATORY_TRACT

## 2022-08-03 NOTE — Discharge Instructions (Addendum)
Your chest x-ray shows stable findings.  This is very reassuring.  I have swabbed your nose for COVID today.  I will call you if this is positive.  Continue using your nebulizer treatments at home.  I gave you a shot of steroid in the clinic which will help to reduce inflammation and wheeze over the next couple of days in your body.  If your symptoms do not improve in the next 24 to 48 hours, or if you develop any new symptoms, I would like for you to go to the nearest emergency department for reevaluation.  Please schedule an appointment with your primary care provider for follow-up in the next 3 to 4 days to make sure that your lung sounds have improved.  I hope you feel better!

## 2022-08-03 NOTE — ED Triage Notes (Signed)
Pt reports asthma episode three days ago that was improved after neb tx at home. Overnight, started having SOB again, took neb 0100 with minimal improvement. States she has worsening SOB with exertion and laying flat. Denies CP. Significant pitting edema noted to BLEs. States this is normal, not worsening over last few days. Compression stockings in place. Daughter at bedside.

## 2022-08-03 NOTE — ED Provider Notes (Addendum)
[facility]    CSN: [id number] Arrival date & time: 08/03/22  1119      History   Chief Complaint Chief Complaint  Patient presents with   Shortness of Breath    HPI [name] is a 89 y.o. female.   Patient presents to urgent care with her daughter who contributes to the history for evaluation of cough, wheezing, shortness of breath, and generalized fatigue for the last 3 days.  Patient has a history of asthma and has been using her nebulizer machine at home to help with symptoms which initially helped, however is no longer helping with cough and shortness of breath.  She last used her albuterol nebulizer this morning at around 2:00 in the morning and states that this did not help with her symptoms at all.  She is experiencing new orthopnea with shortness of breath with laying flat.  She has bilateral lower extremity edema at baseline and wears compression stockings to help with this.  Denies recent change in leg swelling bilaterally.  No bilateral calf pain, numbness or tingling distally to the lower extremities, or fever/chills.  Denies recent antibiotic or steroid use.  She has never been a smoker and denies drug use.  Currently short of breath but denies chest pain, heart palpitations, nausea, vomiting, body aches, sore throat, headache, ear pain, and dizziness.  No vision changes or new confusion.  No recent falls or urinary symptoms.  Has not been using any other over-the-counter medications before coming to urgent care.   Shortness of Breath   Past Medical History:  Diagnosis Date   Acute superficial venous thrombosis of left lower extremity 03/27/2014   concern about hx and potential extensive on exam tenderness calf and low dose lovenox 40 qd 2-4 weekselevetion and close  fu advised .    Allergy    Anemia    Anxiety    Arthritis    "shoulders" (09/09/2012)   Asthma 09/08/2012   Carotid bruit    [place] 2018  low risk 1 - 39%   Cataract    bil cateracts  removed   Chronic kidney disease    Clotting disorder (HCC)    blood clots in legs   Diabetes mellitus without complication ([place])    "Borderline" per pt; being monitored.  no meds per pt   Diverticulosis    Dyspnea 04/27/2014   GERD (gastroesophageal reflux disease)    Headache(784.0)    "related to my high blood pressure" (09/09/2012)   Hiatal hernia    History of DVT (deep vein thrombosis)    "RLE" (09/09/2012) coumadine cant take asa so on plavix    HTN (hypertension) 09/08/2012   Hyperlipidemia    Lupus ([place])    "cured years ago" (09/09/2012)   Other dysphagia 02/11/2013   Syncope and collapse 01/21/2018   Varicose veins    Varicose veins of leg with complications [id number]    Patient Active Problem List   Diagnosis Date Noted   Pulmonary hypertension, unspecified ([place]) 12/06/2021   Chronic cough 10/18/2021   Chronic rhinitis 11/24/2019   Nausea 11/03/2018   Elevated troponin 11/03/2018   Hyponatremia 11/03/2018   Hypertensive heart disease with hypertensive chronic kidney disease 11/30/2017   Fasting hyperglycemia 11/30/2017   Renal cyst 03/27/2016   CKD (chronic kidney disease) stage 3, GFR 30-59 ml/min (HCC) 08/23/2015   Perceived hearing changes 06/29/2014   Resistant hypertension 06/29/2014   Lumbago 06/15/2014   Pre-diabetes vs early DM  06/15/2014   Dyspnea  04/27/2014   Asthma, chronic 04/13/2014   Elevated uric acid in blood 04/13/2014   Essential hypertension 03/27/2014   History of DVT (deep vein thrombosis)    Palpitations 01/05/2014   Anemia of chronic disease December 31, 2013   Death of family member 09/04/2013   Leg edema 06/20/2013   Bereavement due to life event 04/21/2013   Other dysphagia 02/11/2013   Colon cancer screening 02/11/2013   Anxiety state 01/20/2013   Leg cramps 01/20/2013   Back pain 12/05/2012   Family hx of colon cancer 10/21/2012   Family hx of lung cancer 10/21/2012   Family hx-breast malignancy 10/21/2012   Renal insufficiency  creatinine 1.3 10/21/2012   Anemia, chronic disease 10/21/2012   GERD (gastroesophageal reflux disease) 09/08/2012   HTN (hypertension) 09/08/2012   Hyperlipidemia 09/08/2012   Varicose veins of leg with complications [id number]    Past Surgical History:  Procedure Laterality Date   ABDOMINAL HYSTERECTOMY     partial   BREAST EXCISIONAL BIOPSY Right    BREAST SURGERY     CARPAL TUNNEL RELEASE Right 2000's   CARPAL TUNNEL RELEASE Bilateral 10/21/2013   Procedure: BILATERAL  CARPAL TUNNEL RELEASE;  Surgeon: [name], MD;  Location: [place];  Service: Orthopedics;  Laterality: Bilateral;   COLONOSCOPY     SHOULDER OPEN ROTATOR CUFF REPAIR Bilateral 2000's   TRIGGER FINGER RELEASE Right 10/21/2013   Procedure: RELEASE TRIGGER FINGER/A-1 PULLEY RIGHT MIDDLE AND RIGHT RING;  Surgeon: [name], MD;  Location: [place];  Service: Orthopedics;  Laterality: Right;   UPPER GASTROINTESTINAL ENDOSCOPY      OB History     Gravida  7   Para  7   Term      Preterm      AB      Living         SAB      IAB      Ectopic      Multiple      Live Births           Obstetric Comments  Lost 2 children soon after birth          Home Medications    Prior to Admission medications   Medication Sig Start Date End Date Taking? Authorizing Provider  acetaminophen (TYLENOL) 500 MG tablet Take 500 mg by mouth in the morning and at bedtime.   Yes [provider]  albuterol (VENTOLIN HFA) 108 (90 Base) MCG/ACT inhaler USE 2 PUFFS EVERY 6 HOURS AS NEEDED FOR WHEEZING. 09/26/21  Yes Collene Gobble, MD  amLODipine (NORVASC) 5 MG tablet Take 1 tablet (5 mg total) by mouth daily. 03/07/22  Yes Nahser, Wonda Cheng, MD  atorvastatin (LIPITOR) 40 MG tablet Take 1 tablet (40 mg total) by mouth daily. 04/21/22  Yes Nahser, Wonda Cheng, MD  Azelastine-Fluticasone 137-50 MCG/ACT SUSP PLACE ONE SPRAY IN EACH NOSTRIL TWICE DAILY (IN THE MORNING AND AT  BEDTIME) 01/19/22  Yes Byrum, Rose Fillers, MD  Budeson-Glycopyrrol-Formoterol (BREZTRI AEROSPHERE) 160-9-4.8 MCG/ACT AERO INHALE 2 PUFFS INTO THE LUNGS TWICE DAILY, IN THE MORNING & AT BEDTIME. 06/22/22  Yes Byrum, Rose Fillers, MD  carvedilol (COREG) 6.25 MG tablet Take 6.25 mg by mouth daily.   Yes [provider]  cetirizine (ZYRTEC) 10 MG tablet Take 10 mg by mouth daily.   Yes [provider]  citalopram (CELEXA) 20 MG tablet Take 1 tablet (20 mg total) by mouth every evening. 05/18/22  Yes Panosh,  Standley Brooking, MD  clopidogrel (PLAVIX) 75 MG tablet TAKE ONE TABLET BY MOUTH ONCE DAILY. 05/16/22  Yes Panosh, Standley Brooking, MD  diclofenac Sodium (VOLTAREN) 1 % GEL Apply 4 g topically 2 (two) times daily as needed (painful areas). 05/02/22  Yes Nyoka Lint, PA-C  doxazosin (CARDURA) 2 MG tablet Take 3 tablets (6 mg total) by mouth at bedtime. 02/20/22  Yes Panosh, Standley Brooking, MD  ferrous sulfate 325 (65 FE) MG EC tablet Take 1 tablet (325 mg total) by mouth daily with breakfast. 12/15/14  Yes Panosh, Standley Brooking, MD  fluticasone (FLONASE) 50 MCG/ACT nasal spray Place 2 sprays into both nostrils daily. 10/20/19  Yes Collene Gobble, MD  furosemide (LASIX) 40 MG tablet Take 40-80 mg by mouth See admin instructions. 80 mg in the am and 40 mg in the pm   Yes [provider]  hydrALAZINE (APRESOLINE) 50 MG tablet Take 1&1/2 tablets (75 mg total) by mouth 3 (three) times daily. 04/12/22  Yes Nahser, Wonda Cheng, MD  LORazepam (ATIVAN) 0.5 MG tablet TAKE ONE TABLET BY MOUTH TWICE DAILY AS NEEDED FOR ANXIETY 05/18/22  Yes Dutch Quint B, FNP  montelukast (SINGULAIR) 10 MG tablet TAKE ONE TABLET BY MOUTH ONCE DAILY 04/11/22  Yes Panosh, Standley Brooking, MD  ondansetron (ZOFRAN-ODT) 8 MG disintegrating tablet 8mg  ODT q4 hours prn nausea 0000000  Yes Delora Fuel, MD  pantoprazole (PROTONIX) 40 MG tablet Take 1 tablet (40 mg total) by mouth 2 (two) times daily. 02/20/22  Yes Panosh, Standley Brooking, MD  potassium chloride (KLOR-CON) 10 MEQ  tablet TAKE FOUR TABLETS BY MOUTH DAILY. 03/22/22  Yes Panosh, Standley Brooking, MD  RESTASIS 0.05 % ophthalmic emulsion 1 drop 2 (two) times daily. 10/13/21  Yes [provider]  Spacer/Aero-Holding Chambers (AEROCHAMBER PLUS) inhaler Use as instructed 05/26/16  Yes Melynda Ripple, MD  vitamin B-12 (CYANOCOBALAMIN) 1000 MCG tablet Take 1,000 mcg by mouth daily.   Yes [provider]  Vitamin D, Cholecalciferol, 25 MCG (1000 UT) TABS Take 1,000 Units by mouth daily.   Yes [provider]  albuterol (PROVENTIL) (2.5 MG/3ML) 0.083% nebulizer solution Take 3 mLs (2.5 mg total) by nebulization every 6 (six) hours as needed for shortness of breath or wheezing. 08/03/22   Talbot Grumbling, FNP  azaTHIOprine (IMURAN) 50 MG tablet Take 25 mg by mouth at bedtime. 04/09/21   [provider]  cloNIDine (CATAPRES) 0.1 MG tablet Take 1 tablet every 12 hours as needed for blood pressures greater than 99991111 systolic Patient taking differently: Take 0.1 mg by mouth daily. 09/10/21   Veryl Speak, MD    Family History Family History  Problem Relation Age of Onset   Hypertension Mother    Lung cancer Mother    Hypertension Father    Lung cancer Father    Breast cancer Sister    Colon cancer Sister        ? 80' s dx - died in 77's   Breast cancer Sister    Hypertension Brother    Rectal cancer Brother    Stomach cancer Brother    Breast cancer Brother    Heart attack Daughter    Esophageal cancer Daughter    Hypertension Daughter    Diabetes Daughter    Breast cancer Paternal Aunt    Lung cancer Other        both parents    Pancreatic cancer Neg Hx     Social History Social History   Tobacco Use  Smoking status: Never   Smokeless tobacco: Never  Vaping Use   Vaping Use: Never used  Substance Use Topics   Alcohol use: No   Drug use: No     Allergies   Penicillins and Aspirin   Review of Systems Review of Systems  Respiratory:  Positive for shortness of  breath.   Per HPI   Physical Exam Triage Vital Signs ED Triage Vitals  Enc Vitals Group     BP 08/03/22 1150 (!) 156/69     Pulse Rate 08/03/22 1150 68     Resp 08/03/22 1150 20     Temp 08/03/22 1150 98 F (36.7 C)     Temp Source 08/03/22 1150 Oral     SpO2 08/03/22 1150 94 %     Weight --      Height --      Head Circumference --      Peak Flow --      Pain Score 08/03/22 1157 0     Pain Loc --      Pain Edu? --      Excl. in [place]? --    No data found.  Updated Vital Signs BP (!) 156/69 (BP Location: Left Arm)   Pulse 68   Temp 98 F (36.7 C) (Oral)   Resp 20   SpO2 94%   Visual Acuity Right Eye Distance:   Left Eye Distance:   Bilateral Distance:    Right Eye Near:   Left Eye Near:    Bilateral Near:     Physical Exam Vitals and nursing note reviewed.  Constitutional:      Appearance: She is not ill-appearing or toxic-appearing.  HENT:     Head: Normocephalic and atraumatic.     Right Ear: Hearing and external ear normal.     Left Ear: Hearing and external ear normal.     Nose: Nose normal.     Mouth/Throat:     Lips: Pink.  Eyes:     General: Lids are normal. Vision grossly intact. Gaze aligned appropriately.     Extraocular Movements: Extraocular movements intact.     Conjunctiva/sclera: Conjunctivae normal.  Cardiovascular:     Rate and Rhythm: Normal rate and regular rhythm.     Pulses:          Radial pulses are 2+ on the right side and 2+ on the left side.       Dorsalis pedis pulses are 1+ on the right side and 1+ on the left side.     Heart sounds: Normal heart sounds, S1 normal and S2 normal.  Pulmonary:     Effort: Pulmonary effort is normal. No respiratory distress.     Breath sounds: Normal air entry. Examination of the right-lower field reveals rales. Examination of the left-lower field reveals rales. Wheezing, rhonchi and rales present.     Comments: Audible wheezing without stethoscope with increased work of breathing and prolonged  expiration appreciated to exam.  Patient is able to speak in full sentences without difficulty.  Diffuse rhonchi with inspiratory and expiratory wheezing heard to all lung fields bilaterally.  Faint fine crackles heard to the bilateral lower lung fields. Musculoskeletal:     Cervical back: Neck supple.     Right lower leg: 1+ Edema (baseline) present.     Left lower leg: 1+ Edema (baseline) present.  Skin:    General: Skin is warm and dry.     Capillary Refill: Capillary refill takes less than 2  seconds.     Findings: No rash.  Neurological:     General: No focal deficit present.     Mental Status: She is alert and oriented to person, place, and time. Mental status is at baseline.     Cranial Nerves: No dysarthria or facial asymmetry.  Psychiatric:        Mood and Affect: Mood normal.        Speech: Speech normal.        Behavior: Behavior normal.        Thought Content: Thought content normal.        Judgment: Judgment normal.      UC Treatments / Results  Labs (all labs ordered are listed, but only abnormal results are displayed) Labs Reviewed  SARS CORONAVIRUS 2 (TAT 6-24 HRS)    EKG   Radiology DG Chest 2 View  Result Date: 08/03/2022 CLINICAL DATA:  Cough, shortness of breath EXAM: CHEST - 2 VIEW COMPARISON:  05/13/2018 FINDINGS: Rotated to the RIGHT. Enlargement of cardiac silhouette. Prominent mediastinum likely accentuated by rotation. Chronic bronchitic and interstitial changes, stable. No definite pulmonary infiltrate, pleural effusion, or pneumothorax. Degenerative changes thoracic spine. IMPRESSION: Mild chronic bronchitic and interstitial changes without acute abnormalities. Enlargement of cardiac silhouette. Electronically Signed   By: Lavonia Dana M.D.   On: 08/03/2022 12:59    Procedures Procedures (including critical care time)  Medications Ordered in UC Medications  ipratropium-albuterol (DUONEB) 0.5-2.5 (3) MG/3ML nebulizer solution 3 mL (3 mLs  Nebulization Given 08/03/22 1223)  dexamethasone (DECADRON) injection 5 mg (5 mg Intramuscular Given 08/03/22 1233)  albuterol (PROVENTIL) (2.5 MG/3ML) 0.083% nebulizer solution 2.5 mg (2.5 mg Nebulization Given 08/03/22 1311)    Initial Impression / Assessment and Plan / UC Course  I have reviewed the triage vital signs and the nursing notes.  Pertinent labs & imaging results that were available during my care of the patient were reviewed by me and considered in my medical decision making (see chart for details).   1. Moderate persistent asthma with acute exacerbation, shortness of breath, edema of both lower legs Presentation consistent with moderate persistent asthma exacerbation.  Concern for possible CHF exacerbation, however this is unlikely as chest x-ray shows no acute findings and is negative for pleural effusion, pneumonia, and acute changes.  Decadron 5 mg IM given.  Most recent BMP reviewed.  GFR is 14, creatinine is 2.94.  Comfortable giving 5 mg Decadron as patient needs this to reduce inflammation, however also taking reduced kidney function into consideration.  Will defer oral prednisone prescription for home due to reduced kidney function.  Dexamethasone will continue to work in the body over the next 24-48 hours. Patient given DuoNeb in clinic with improvement in breath sounds and shortness of breath. Given another breathing treatment, albuterol this time, and reports even more improvement in shortness of breath. Lung sounds improved further. She is clinically non-toxic in appearance and with hemodynamically stable vital signs. Edema to the bilateral lower extremities is currently to her baseline per patient.  Would like for her to continue using compression socks and elevating her legs to reduce the swelling.  No redness, weeping, or neurovascular compromise to bilateral lower extremities.  Advised patient to return to urgent care or please go to the nearest emergency department if  symptoms fail to improve in the next 12 to 24 hours with above interventions.   COVID-19 testing is pending and will come back in the next 12 to 24 hours.  Will call  patient if this is positive.  She is a candidate for molnupiravir if this is positive.  Discussed physical exam and available lab work findings in clinic with patient.  Counseled patient regarding appropriate use of medications and potential side effects for all medications recommended or prescribed today. Discussed red flag signs and symptoms of worsening condition,when to call the PCP office, return to urgent care, and when to seek higher level of care in the emergency department. Patient verbalizes understanding and agreement with plan. All questions answered. Patient discharged in stable condition.   Final Clinical Impressions(s) / UC Diagnoses   Final diagnoses:  Moderate persistent asthma with acute exacerbation  Shortness of breath  Edema of both lower legs     Discharge Instructions      Your chest x-ray shows stable findings.  This is very reassuring.  I have swabbed your nose for COVID today.  I will call you if this is positive.  Continue using your nebulizer treatments at home.  I gave you a shot of steroid in the clinic which will help to reduce inflammation and wheeze over the next couple of days in your body.  If your symptoms do not improve in the next 24 to 48 hours, or if you develop any new symptoms, I would like for you to go to the nearest emergency department for reevaluation.  Please schedule an appointment with your primary care provider for follow-up in the next 3 to 4 days to make sure that your lung sounds have improved.  I hope you feel better!     ED Prescriptions     Medication Sig Dispense Auth. Provider   albuterol (PROVENTIL) (2.5 MG/3ML) 0.083% nebulizer solution Take 3 mLs (2.5 mg total) by nebulization every 6 (six) hours as needed for shortness of breath or wheezing. 360 mL  Talbot Grumbling, FNP      PDMP not reviewed this encounter.   Talbot Grumbling, FNP 08/03/22 [place], FNP 08/03/22 [phone]

## 2022-08-04 LAB — SARS CORONAVIRUS 2 (TAT 6-24 HRS): SARS Coronavirus 2: NEGATIVE

## 2022-08-06 ENCOUNTER — Other Ambulatory Visit: Payer: Self-pay | Admitting: Emergency Medicine

## 2022-08-06 ENCOUNTER — Other Ambulatory Visit: Payer: Self-pay | Admitting: Internal Medicine

## 2022-08-07 ENCOUNTER — Other Ambulatory Visit: Payer: Self-pay | Admitting: Internal Medicine

## 2022-08-07 ENCOUNTER — Other Ambulatory Visit: Payer: Self-pay | Admitting: Family

## 2022-08-07 ENCOUNTER — Encounter: Payer: Self-pay | Admitting: Internal Medicine

## 2022-08-07 ENCOUNTER — Inpatient Hospital Stay: Payer: Medicare Other

## 2022-08-07 ENCOUNTER — Inpatient Hospital Stay: Payer: Medicare Other | Attending: Hematology

## 2022-08-07 ENCOUNTER — Encounter: Payer: Self-pay | Admitting: Emergency Medicine

## 2022-08-07 MED ORDER — PREDNISONE 20 MG PO TABS: 20.0000 mg | ORAL_TABLET | Freq: Every day | ORAL | 0 refills | Status: DC

## 2022-08-07 NOTE — Telephone Encounter (Signed)
[name] granddaughter is checking on message sent. [place] phone number is [phone].

## 2022-08-08 ENCOUNTER — Other Ambulatory Visit: Payer: Self-pay

## 2022-08-08 MED ORDER — PREDNISONE 10 MG PO TABS: ORAL_TABLET | ORAL | 0 refills | Status: AC

## 2022-08-08 NOTE — Telephone Encounter (Signed)
Please give her a prednisone taper > Take 40mg  daily for 3 days, then 30mg  daily for 3 days, then 20mg  daily for 3 days, then 10mg  daily for 3 days, then stop  She probably needs to be seen in our office by APP in the next 1 to 2 weeks to ensure she is improving thank you

## 2022-08-22 ENCOUNTER — Other Ambulatory Visit: Payer: Self-pay | Admitting: Internal Medicine

## 2022-08-29 DIAGNOSIS — H40023 Open angle with borderline findings, high risk, bilateral: Secondary | ICD-10-CM | POA: Diagnosis not present

## 2022-08-29 DIAGNOSIS — H4710 Unspecified papilledema: Secondary | ICD-10-CM | POA: Diagnosis not present

## 2022-08-29 DIAGNOSIS — H04123 Dry eye syndrome of bilateral lacrimal glands: Secondary | ICD-10-CM | POA: Diagnosis not present

## 2022-08-29 DIAGNOSIS — H44113 Panuveitis, bilateral: Secondary | ICD-10-CM | POA: Diagnosis not present

## 2022-09-01 ENCOUNTER — Other Ambulatory Visit: Payer: Self-pay | Admitting: Family

## 2022-09-04 ENCOUNTER — Inpatient Hospital Stay: Payer: Medicare Other | Attending: Hematology

## 2022-09-04 ENCOUNTER — Other Ambulatory Visit: Payer: Self-pay

## 2022-09-04 ENCOUNTER — Inpatient Hospital Stay: Payer: Medicare Other

## 2022-09-04 VITALS — BP 149/69 | HR 61 | Resp 18

## 2022-09-04 DIAGNOSIS — N183 Chronic kidney disease, stage 3 unspecified: Secondary | ICD-10-CM | POA: Insufficient documentation

## 2022-09-04 DIAGNOSIS — D631 Anemia in chronic kidney disease: Secondary | ICD-10-CM | POA: Diagnosis not present

## 2022-09-04 DIAGNOSIS — D638 Anemia in other chronic diseases classified elsewhere: Secondary | ICD-10-CM

## 2022-09-04 DIAGNOSIS — I129 Hypertensive chronic kidney disease with stage 1 through stage 4 chronic kidney disease, or unspecified chronic kidney disease: Secondary | ICD-10-CM | POA: Insufficient documentation

## 2022-09-04 DIAGNOSIS — Z79899 Other long term (current) drug therapy: Secondary | ICD-10-CM | POA: Insufficient documentation

## 2022-09-04 LAB — CBC WITH DIFFERENTIAL (CANCER CENTER ONLY)
Abs Immature Granulocytes: 0.05 10*3/uL (ref 0.00–0.07)
Basophils Absolute: 0 10*3/uL (ref 0.0–0.1)
Basophils Relative: 0 %
Eosinophils Absolute: 0.1 10*3/uL (ref 0.0–0.5)
Eosinophils Relative: 1 %
HCT: 29.6 % — ABNORMAL LOW (ref 36.0–46.0)
Hemoglobin: 9.6 g/dL — ABNORMAL LOW (ref 12.0–15.0)
Immature Granulocytes: 1 %
Lymphocytes Relative: 24 %
Lymphs Abs: 1.3 10*3/uL (ref 0.7–4.0)
MCH: 30.1 pg (ref 26.0–34.0)
MCHC: 32.4 g/dL (ref 30.0–36.0)
MCV: 92.8 fL (ref 80.0–100.0)
Monocytes Absolute: 0.4 10*3/uL (ref 0.1–1.0)
Monocytes Relative: 7 %
Neutro Abs: 3.5 10*3/uL (ref 1.7–7.7)
Neutrophils Relative %: 67 %
Platelet Count: 143 10*3/uL — ABNORMAL LOW (ref 150–400)
RBC: 3.19 MIL/uL — ABNORMAL LOW (ref 3.87–5.11)
RDW: 14.1 % (ref 11.5–15.5)
WBC Count: 5.2 10*3/uL (ref 4.0–10.5)
nRBC: 0 % (ref 0.0–0.2)

## 2022-09-04 LAB — FERRITIN: Ferritin: 180 ng/mL (ref 11–307)

## 2022-09-04 MED ORDER — DARBEPOETIN ALFA 100 MCG/0.5ML IJ SOSY
100.0000 ug | PREFILLED_SYRINGE | Freq: Once | INTRAMUSCULAR | Status: AC
  Administered 2022-09-04: 100 ug via SUBCUTANEOUS
  Filled 2022-09-04: qty 0.5

## 2022-09-05 DIAGNOSIS — H44113 Panuveitis, bilateral: Secondary | ICD-10-CM | POA: Diagnosis not present

## 2022-09-12 ENCOUNTER — Other Ambulatory Visit: Payer: Self-pay | Admitting: Internal Medicine

## 2022-09-13 DIAGNOSIS — I471 Supraventricular tachycardia, unspecified: Secondary | ICD-10-CM | POA: Diagnosis not present

## 2022-09-13 DIAGNOSIS — D631 Anemia in chronic kidney disease: Secondary | ICD-10-CM | POA: Diagnosis not present

## 2022-09-13 DIAGNOSIS — J45909 Unspecified asthma, uncomplicated: Secondary | ICD-10-CM | POA: Diagnosis not present

## 2022-09-13 DIAGNOSIS — I120 Hypertensive chronic kidney disease with stage 5 chronic kidney disease or end stage renal disease: Secondary | ICD-10-CM | POA: Diagnosis not present

## 2022-09-13 DIAGNOSIS — N2581 Secondary hyperparathyroidism of renal origin: Secondary | ICD-10-CM | POA: Diagnosis not present

## 2022-09-13 DIAGNOSIS — N185 Chronic kidney disease, stage 5: Secondary | ICD-10-CM | POA: Diagnosis not present

## 2022-09-13 LAB — BASIC METABOLIC PANEL
BUN: 56 — AB (ref 4–21)
CO2: 22 (ref 13–22)
Chloride: 110 — AB (ref 99–108)
Creatinine: 3.1 — AB (ref 0.5–1.1)
Glucose: 105
Potassium: 4.5 mEq/L (ref 3.5–5.1)
Sodium: 142 (ref 137–147)

## 2022-09-13 LAB — LAB REPORT - SCANNED: EGFR: 14

## 2022-09-13 LAB — COMPREHENSIVE METABOLIC PANEL
Albumin: 3.1 — AB (ref 3.5–5.0)
Calcium: 8.2 — AB (ref 8.7–10.7)

## 2022-09-15 ENCOUNTER — Other Ambulatory Visit: Payer: Self-pay

## 2022-09-17 MED ORDER — LORAZEPAM 0.5 MG PO TABS: 0.5000 mg | ORAL_TABLET | Freq: Two times a day (BID) | ORAL | 0 refills | Status: DC | PRN

## 2022-09-18 ENCOUNTER — Encounter: Payer: Self-pay | Admitting: Internal Medicine

## 2022-09-21 ENCOUNTER — Ambulatory Visit
Admission: EM | Admit: 2022-09-21 | Discharge: 2022-09-21 | Disposition: A | Discharge status: Home / Self Care | Payer: Medicare Other | Attending: Internal Medicine | Admitting: Internal Medicine

## 2022-09-21 DIAGNOSIS — J4521 Mild intermittent asthma with (acute) exacerbation: Secondary | ICD-10-CM | POA: Diagnosis not present

## 2022-09-21 MED ORDER — IPRATROPIUM-ALBUTEROL 0.5-2.5 (3) MG/3ML IN SOLN
3.0000 mL | Freq: Once | RESPIRATORY_TRACT | Status: AC
  Administered 2022-09-21: 3 mL via RESPIRATORY_TRACT

## 2022-09-21 MED ORDER — METHYLPREDNISOLONE ACETATE 80 MG/ML IJ SUSP
80.0000 mg | Freq: Once | INTRAMUSCULAR | Status: AC
  Administered 2022-09-21: 80 mg via INTRAMUSCULAR

## 2022-09-21 MED ORDER — PREDNISONE 20 MG PO TABS: 20.0000 mg | ORAL_TABLET | Freq: Every day | ORAL | 0 refills | Status: AC

## 2022-09-21 NOTE — Discharge Instructions (Signed)
You were given a steroid shot today in urgent care for asthma exacerbation.  I have also prescribed you prednisone pills which you will start taking tomorrow.  Follow-up with any further concerns or persistent symptoms.

## 2022-09-21 NOTE — ED Triage Notes (Signed)
Pt states cough and wheezing states she has asthma. States she took a neb treatment at home today.

## 2022-09-21 NOTE — ED Provider Notes (Signed)
[facility]    CSN: [id number] Arrival date & time: 09/21/22  1332      History   Chief Complaint Chief Complaint  Patient presents with   Cough    HPI [name] is a 89 y.o. female.   Patient presents today due to concerns of asthma.  Patient reports that she has had cough, sneezing, wheezing for about 4 to 5 days.  Reports the cough is productive at times.  Denies nasal congestion, runny nose, sore throat, fever.  Denies any known sick contacts.  Patient has used her albuterol nebulizer treatment earlier this morning with some improvement in symptoms.  Patient reports that she typically gets a steroid shot and then takes steroid pills starting the next day which typically resolves her symptoms.  She had similar symptoms back in March and it resolved with steroid therapy.  Blood pressure slightly elevated with recheck being improved.  She reports that she does check her blood pressure at home and is typically 130 systolic.  Has been taking her blood pressure medication as prescribed.   Cough   Past Medical History:  Diagnosis Date   Acute superficial venous thrombosis of left lower extremity 03/27/2014   concern about hx and potential extensive on exam tenderness calf and low dose lovenox 40 qd 2-4 weekselevetion and close  fu advised .    Allergy    Anemia    Anxiety    Arthritis    "shoulders" (09/09/2012)   Asthma 09/08/2012   Carotid bruit    [place] 2018  low risk 1 - 39%   Cataract    bil cateracts removed   Chronic kidney disease    Clotting disorder (HCC)    blood clots in legs   Diabetes mellitus without complication (HCC)    "Borderline" per pt; being monitored.  no meds per pt   Diverticulosis    Dyspnea 04/27/2014   GERD (gastroesophageal reflux disease)    Headache(784.0)    "related to my high blood pressure" (09/09/2012)   Hiatal hernia    History of DVT (deep vein thrombosis)    "RLE" (09/09/2012) coumadine cant take asa so on plavix     HTN (hypertension) 09/08/2012   Hyperlipidemia    Lupus (HCC)    "cured years ago" (09/09/2012)   Other dysphagia 02/11/2013   Syncope and collapse 01/21/2018   Varicose veins    Varicose veins of leg with complications 12/05/2010    Patient Active Problem List   Diagnosis Date Noted   Pulmonary hypertension, unspecified (HCC) 12/06/2021   Chronic cough 10/18/2021   Chronic rhinitis 11/24/2019   Nausea 11/03/2018   Elevated troponin 11/03/2018   Hyponatremia 11/03/2018   Hypertensive heart disease with hypertensive chronic kidney disease 11/30/2017   Fasting hyperglycemia 11/30/2017   Renal cyst 03/27/2016   CKD (chronic kidney disease) stage 3, GFR 30-59 ml/min (HCC) 08/23/2015   Perceived hearing changes 06/29/2014   Resistant hypertension 06/29/2014   Lumbago 06/15/2014   Pre-diabetes vs early DM  06/15/2014   Dyspnea 04/27/2014   Asthma, chronic 04/13/2014   Elevated uric acid in blood 04/13/2014   Essential hypertension 03/27/2014   History of DVT (deep vein thrombosis)    Palpitations 01/05/2014   Anemia of chronic disease 01-13-2014   Death of family member 09-17-2013   Leg edema 06/20/2013   Bereavement due to life event 04/21/2013   Other dysphagia 02/11/2013   Colon cancer screening 02/11/2013   Anxiety state 01/20/2013   Leg  cramps 01/20/2013   Back pain 12/05/2012   Family hx of colon cancer 10/21/2012   Family hx of lung cancer 10/21/2012   Family hx-breast malignancy 10/21/2012   Renal insufficiency creatinine 1.3 10/21/2012   Anemia, chronic disease 10/21/2012   GERD (gastroesophageal reflux disease) 09/08/2012   HTN (hypertension) 09/08/2012   Hyperlipidemia 09/08/2012   Varicose veins of leg with complications 12/05/2010    Past Surgical History:  Procedure Laterality Date   ABDOMINAL HYSTERECTOMY     partial   BREAST EXCISIONAL BIOPSY Right    BREAST SURGERY     CARPAL TUNNEL RELEASE Right 2000's   CARPAL TUNNEL RELEASE Bilateral  10/21/2013   Procedure: BILATERAL  CARPAL TUNNEL RELEASE;  Surgeon: Nicki Reaper, MD;  Location: [facility];  Service: Orthopedics;  Laterality: Bilateral;   COLONOSCOPY     SHOULDER OPEN ROTATOR CUFF REPAIR Bilateral 2000's   TRIGGER FINGER RELEASE Right 10/21/2013   Procedure: RELEASE TRIGGER FINGER/A-1 PULLEY RIGHT MIDDLE AND RIGHT RING;  Surgeon: Nicki Reaper, MD;  Location: [facility];  Service: Orthopedics;  Laterality: Right;   UPPER GASTROINTESTINAL ENDOSCOPY      OB History     Gravida  7   Para  7   Term      Preterm      AB      Living         SAB      IAB      Ectopic      Multiple      Live Births           Obstetric Comments  Lost 2 children soon after birth          Home Medications    Prior to Admission medications   Medication Sig Start Date End Date Taking? Authorizing Provider  predniSONE (DELTASONE) 20 MG tablet Take 1 tablet (20 mg total) by mouth daily for 5 days. 09/21/22 09/26/22 Yes Deagen Krass, Acie Fredrickson, FNP  acetaminophen (TYLENOL) 500 MG tablet Take 500 mg by mouth in the morning and at bedtime.    [provider]  albuterol (PROVENTIL) (2.5 MG/3ML) 0.083% nebulizer solution Take 3 mLs (2.5 mg total) by nebulization every 6 (six) hours as needed for shortness of breath or wheezing. 08/03/22   Carlisle Beers, FNP  albuterol (VENTOLIN HFA) 108 (90 Base) MCG/ACT inhaler USE 2 PUFFS EVERY 6 HOURS AS NEEDED FOR WHEEZING. 08/07/22   Leslye Peer, MD  amLODipine (NORVASC) 5 MG tablet Take 1 tablet (5 mg total) by mouth daily. 03/07/22   Nahser, Deloris Ping, MD  atorvastatin (LIPITOR) 40 MG tablet Take 1 tablet (40 mg total) by mouth daily. 04/21/22   Nahser, Deloris Ping, MD  azaTHIOprine (IMURAN) 50 MG tablet Take 25 mg by mouth at bedtime. 04/09/21   [provider]  Azelastine-Fluticasone 137-50 MCG/ACT SUSP PLACE ONE SPRAY IN EACH NOSTRIL TWICE DAILY (IN THE MORNING AND AT BEDTIME) 01/19/22   Byrum,  Les Pou, MD  Budeson-Glycopyrrol-Formoterol (BREZTRI AEROSPHERE) 160-9-4.8 MCG/ACT AERO INHALE 2 PUFFS INTO THE LUNGS TWICE DAILY, IN THE MORNING & AT BEDTIME. 06/22/22   Byrum, Les Pou, MD  carvedilol (COREG) 6.25 MG tablet Take 6.25 mg by mouth daily.    [provider]  cetirizine (ZYRTEC) 10 MG tablet Take 10 mg by mouth daily.    [provider]  citalopram (CELEXA) 20 MG tablet Take 1 tablet (20 mg total) by mouth every evening. 09/12/22  Panosh, Neta Mends, MD  cloNIDine (CATAPRES) 0.1 MG tablet Take 1 tablet every 12 hours as needed for blood pressures greater than 180 systolic Patient taking differently: Take 0.1 mg by mouth daily. 09/10/21   Geoffery Lyons, MD  clopidogrel (PLAVIX) 75 MG tablet TAKE ONE TABLET BY MOUTH ONCE DAILY. 08/07/22   Panosh, Neta Mends, MD  diclofenac Sodium (VOLTAREN) 1 % GEL Apply 4 g topically 2 (two) times daily as needed (painful areas). 05/02/22   Ellsworth Lennox, PA-C  doxazosin (CARDURA) 2 MG tablet TAKE THREE TABLETS BY MOUTH AT BEDTIME 08/22/22   Panosh, Neta Mends, MD  ferrous sulfate 325 (65 FE) MG EC tablet Take 1 tablet (325 mg total) by mouth daily with breakfast. 12/15/14   Panosh, Neta Mends, MD  fluticasone (FLONASE) 50 MCG/ACT nasal spray Place 2 sprays into both nostrils daily. 10/20/19   Leslye Peer, MD  furosemide (LASIX) 40 MG tablet Take 40-80 mg by mouth See admin instructions. 80 mg in the am and 40 mg in the pm    [provider]  hydrALAZINE (APRESOLINE) 50 MG tablet Take 1&1/2 tablets (75 mg total) by mouth 3 (three) times daily. 04/12/22   Nahser, Deloris Ping, MD  LORazepam (ATIVAN) 0.5 MG tablet Take 1 tablet (0.5 mg total) by mouth 2 (two) times daily as needed. for anxiety 09/17/22   Panosh, Neta Mends, MD  montelukast (SINGULAIR) 10 MG tablet TAKE ONE TABLET BY MOUTH ONCE DAILY 08/06/22   Worthy Rancher B, FNP  ondansetron (ZOFRAN-ODT) 8 MG disintegrating tablet 8mg  ODT q4 hours prn nausea 09/12/21   Dione Booze, MD  pantoprazole  (PROTONIX) 40 MG tablet TAKE ONE TABLET BY MOUTH TWICE DAILY 08/22/22   Panosh, Neta Mends, MD  potassium chloride (KLOR-CON) 10 MEQ tablet TAKE FOUR TABLETS BY MOUTH DAILY. 03/22/22   Panosh, Neta Mends, MD  RESTASIS 0.05 % ophthalmic emulsion 1 drop 2 (two) times daily. 10/13/21   [provider]  Spacer/Aero-Holding Chambers (AEROCHAMBER PLUS) inhaler Use as instructed 05/26/16   Domenick Gong, MD  vitamin B-12 (CYANOCOBALAMIN) 1000 MCG tablet Take 1,000 mcg by mouth daily.    [provider]  Vitamin D, Cholecalciferol, 25 MCG (1000 UT) TABS Take 1,000 Units by mouth daily.    [provider]    Family History Family History  Problem Relation Age of Onset   Hypertension Mother    Lung cancer Mother    Hypertension Father    Lung cancer Father    Breast cancer Sister    Colon cancer Sister        ? 10' s dx - died in 67's   Breast cancer Sister    Hypertension Brother    Rectal cancer Brother    Stomach cancer Brother    Breast cancer Brother    Heart attack Daughter    Esophageal cancer Daughter    Hypertension Daughter    Diabetes Daughter    Breast cancer Paternal Aunt    Lung cancer Other        both parents    Pancreatic cancer Neg Hx     Social History Social History   Tobacco Use   Smoking status: Never   Smokeless tobacco: Never  Vaping Use   Vaping Use: Never used  Substance Use Topics   Alcohol use: No   Drug use: No     Allergies   Penicillins and Aspirin   Review of Systems Review of Systems Per HPI  Physical Exam Triage  Vital Signs ED Triage Vitals  Enc Vitals Group     BP 09/21/22 1447 (!) 174/66     Pulse Rate 09/21/22 1447 67     Resp 09/21/22 1447 16     Temp 09/21/22 1447 98.3 F (36.8 C)     Temp Source 09/21/22 1447 Oral     SpO2 09/21/22 1447 94 %     Weight --      Height --      Head Circumference --      Peak Flow --      Pain Score 09/21/22 1448 0     Pain Loc --      Pain Edu? --      Excl. in GC?  --    No data found.  Updated Vital Signs BP (!) 148/75   Pulse 67   Temp 98.3 F (36.8 C) (Oral)   Resp 16   SpO2 96%   Visual Acuity Right Eye Distance:   Left Eye Distance:   Bilateral Distance:    Right Eye Near:   Left Eye Near:    Bilateral Near:     Physical Exam Constitutional:      General: She is not in acute distress.    Appearance: Normal appearance. She is not toxic-appearing or diaphoretic.  HENT:     Head: Normocephalic and atraumatic.     Right Ear: Tympanic membrane and ear canal normal.     Left Ear: Tympanic membrane and ear canal normal.     Nose: Congestion present.     Mouth/Throat:     Mouth: Mucous membranes are moist.     Pharynx: No posterior oropharyngeal erythema.  Eyes:     Extraocular Movements: Extraocular movements intact.     Conjunctiva/sclera: Conjunctivae normal.     Pupils: Pupils are equal, round, and reactive to light.  Cardiovascular:     Rate and Rhythm: Normal rate and regular rhythm.     Pulses: Normal pulses.     Heart sounds: Normal heart sounds.  Pulmonary:     Effort: Pulmonary effort is normal. No respiratory distress.     Breath sounds: No stridor. Wheezing present. No rhonchi or rales.     Comments: Expiratory wheezes noted bilaterally to auscultation. Abdominal:     General: Abdomen is flat. Bowel sounds are normal.     Palpations: Abdomen is soft.  Musculoskeletal:        General: Normal range of motion.     Cervical back: Normal range of motion.  Skin:    General: Skin is warm and dry.  Neurological:     General: No focal deficit present.     Mental Status: She is alert and oriented to person, place, and time. Mental status is at baseline.  Psychiatric:        Mood and Affect: Mood normal.        Behavior: Behavior normal.      UC Treatments / Results  Labs (all labs ordered are listed, but only abnormal results are displayed) Labs Reviewed - No data to display  EKG   Radiology No results  found.  Procedures Procedures (including critical care time)  Medications Ordered in UC Medications  ipratropium-albuterol (DUONEB) 0.5-2.5 (3) MG/3ML nebulizer solution 3 mL (3 mLs Nebulization Given 09/21/22 1459)  methylPREDNISolone acetate (DEPO-MEDROL) injection 80 mg (80 mg Intramuscular Given 09/21/22 1525)    Initial Impression / Assessment and Plan / UC Course  I have reviewed the triage vital signs  and the nursing notes.  Pertinent labs & imaging results that were available during my care of the patient were reviewed by me and considered in my medical decision making (see chart for details).     Patient presenting with asthma exacerbation.  It is very mild and oxygen is normal with no tachypnea so do think that emergent evaluation is necessary.  DuoNeb administered with improvement in patient's symptoms and patient setting that she felt better.  Will treat with IM steroid today in urgent care and prednisone pills that was sent to the pharmacy that she will start taking tomorrow.  No obvious contraindications to steroid therapy noted in patient's history and benefits definitely outweigh risks.  Blood pressure recheck was improved but encouraged patient to monitor blood pressure closely at home.  Advised patient of strict return and ER precautions.  Patient verbalized understanding and was agreeable with plan. Final Clinical Impressions(s) / UC Diagnoses   Final diagnoses:  Mild intermittent asthma with acute exacerbation     Discharge Instructions      You were given a steroid shot today in urgent care for asthma exacerbation.  I have also prescribed you prednisone pills which you will start taking tomorrow.  Follow-up with any further concerns or persistent symptoms.     ED Prescriptions     Medication Sig Dispense Auth. Provider   predniSONE (DELTASONE) 20 MG tablet Take 1 tablet (20 mg total) by mouth daily for 5 days. 5 tablet [place], Acie Fredrickson, [place]      PDMP not  reviewed this encounter.   Gustavus Bryant, [place] 09/21/22 [phone]

## 2022-10-02 ENCOUNTER — Inpatient Hospital Stay: Payer: Medicare Other

## 2022-10-04 ENCOUNTER — Telehealth: Payer: Self-pay | Admitting: Hematology

## 2022-10-04 NOTE — Telephone Encounter (Signed)
Contacted patient to scheduled appointments. Left message with appointment details and a call back number if patient had any questions or could not accommodate the time we provided.   

## 2022-10-06 ENCOUNTER — Inpatient Hospital Stay: Payer: Medicare Other | Attending: Hematology

## 2022-10-06 ENCOUNTER — Inpatient Hospital Stay: Payer: Medicare Other

## 2022-10-11 NOTE — Telephone Encounter (Signed)
Saw dr Holland Falling in May  cr 3.11

## 2022-10-19 ENCOUNTER — Other Ambulatory Visit: Payer: Self-pay | Admitting: Internal Medicine

## 2022-10-19 DIAGNOSIS — H44113 Panuveitis, bilateral: Secondary | ICD-10-CM | POA: Diagnosis not present

## 2022-10-19 DIAGNOSIS — H1132 Conjunctival hemorrhage, left eye: Secondary | ICD-10-CM | POA: Diagnosis not present

## 2022-10-19 NOTE — Progress Notes (Signed)
Care Management & Coordination Services Pharmacy Note  10/19/2022 Name:  [name] MRN:  [id number] DOB:  Oct 02, 1933  Summary: BP not at goal <140/90, not checking at home Several asthma exacerbations resulting in ER trips over the last 6 months  Recommendations/Changes made from today's visit: -Counseled to check BP once to twice daily and keep a log. If consistently elevated at next PCP appt (6/18), need to consider increasing amlodipine or hydralazine -Counseled to seek medical advice from PCP office or cardio for any SBP >160 that is not resolving at home with rest over the course of 15-30 min -Counseled to schedule f/u with her existing pulmonary care doctor to discuss new asthma therapies, such as airsupra or potential of trying trelegy instead of breztri, newer injectable therapies  Follow up plan: BP/Asthma call in 1 and 2 months Pharmacist visit in 3 months   Subjective: [name] is an 89 y.o. year old female who is a primary patient of Panosh, Neta Mends, MD.  The care coordination team was consulted for assistance with disease management and care coordination needs.    Engaged with patient by telephone for follow up visit.  Recent office visits: 06/29/22 Angela Andreas, MD - For AWV. No medication changes  04/27/2022 Angela Basque DO - Patient was seen for Medicare annual wellness visit and an additional concern. No medication changes.   Recent consult visits: 09/04/22 [name] - For anemia of chronic disease, Arenesp administered  06/23/2022 Angela Miss MD(cardiology) - Patient was seen for Pulmonary hypertension, unspecified and hypertension. Discontinued prednisolone drops.    06/20/2022 Angela Bon MD (eye center) - Patient was seen for follow up for panuveitis of both eyes. No medication changes.    06/08/2022 Angela Mood MD (oncology) - Patient was seen for Anemia of chronic disease. No medication changes.    05/16/2022 Angela Frederic MD (ortho) -  Patient was seen for pain of right knee joint. No additional chart notes.    05/12/2022 Angela Salts PA (ortho) - Patient was seen for pain in lower limb and right knee joint. No additional chart notes.   04/17/22 [name] (Cardio) - Phone call -  stop crestor and start Lipitor 40mg  due to renal function  Hospital visits: 10/21/22 Moderate persistent asthma exacerbation - START Prednisone and Azithromycin  09/21/22 [name] - For mild intermitten asthma with acute exacerbation, LOS 51 minutes. Received ipratropium neb and steroid injection. START Prednisone 20mg   08/03/22 [facility] - For moderate persistent asthma with acute exacerbation, LOS 1 hour. Steroid injection given and neb provided  05/02/22 [facility] - For chronic pain of right knee, LOS 1 hour, START Voltaren gel   Objective:  Lab Results  Component Value Date   CREATININE 3.1 (A) 09/13/2022   BUN 56 (A) 09/13/2022   GFR 13.99 (LL) 06/29/2022   EGFR 14.0 09/13/2022   GFRNONAA 20 (L) 09/12/2021   GFRAA 19 11/04/2020   NA 142 09/13/2022   K 4.5 09/13/2022   CALCIUM 8.2 (A) 09/13/2022   CO2 22 09/13/2022   GLUCOSE 104 (H) 06/29/2022    Lab Results  Component Value Date/Time   HGBA1C 5.3 06/29/2022 01:03 PM   HGBA1C 5.4 01/25/2022 11:56 AM   HGBA1C 5.7 06/29/2021 02:15 PM   GFR 13.99 (LL) 06/29/2022 01:03 PM   GFR 19.22 (L) 06/29/2021 02:15 PM   MICROALBUR 119 03/02/2021 12:00 AM   MICROALBUR 103.8 11/04/2020 12:00 AM  Last diabetic Eye exam:  Lab Results  Component Value Date/Time   HMDIABEYEEXA Retinopathy (A) 06/10/2020 12:00 AM    Last diabetic Foot exam: No results found for: "HMDIABFOOTEX"   Lab Results  Component Value Date   CHOL 190 07/21/2022   HDL 92 07/21/2022   LDLCALC 81 07/21/2022   TRIG 97 07/21/2022   CHOLHDL 2.1 07/21/2022       Latest Ref Rng & Units 09/13/2022   12:00 AM 07/21/2022   11:19 AM  06/29/2022    1:03 PM  Hepatic Function  Total Protein 6.0 - 8.3 g/dL   6.1   Albumin 3.5 - 5.0 3.1      3.6   AST 0 - 37 U/L   21   ALT 0 - 32 IU/L  9  10   Alk Phosphatase 39 - 117 U/L   44   Total Bilirubin 0.2 - 1.2 mg/dL   0.5   Bilirubin, Direct 0.0 - 0.3 mg/dL   0.1      This result is from an external source.    Lab Results  Component Value Date/Time   TSH 2.85 06/29/2022 01:03 PM   TSH 1.60 06/29/2021 02:15 PM   FREET4 1.01 06/29/2021 02:15 PM   FREET4 0.74 07/09/2017 03:57 PM       Latest Ref Rng & Units 09/04/2022   12:41 PM 07/10/2022    1:05 PM 06/08/2022   12:42 PM  CBC  WBC 4.0 - 10.5 K/uL 5.2  3.1  3.5   Hemoglobin 12.0 - 15.0 g/dL 9.6  95.6  21.3   Hematocrit 36.0 - 46.0 % 29.6  31.9  30.7   Platelets 150 - 400 K/uL 143  165  141     Lab Results  Component Value Date/Time   VD25OH 32.77 07/09/2017 03:57 PM   VITAMINB12 1,620 (H) 09/08/2012 08:50 PM    Clinical ASCVD: No  The ASCVD Risk score (Arnett DK, et al., 2019) failed to calculate for the following reasons:   The 2019 ASCVD risk score is only valid for ages 73 to 3       04/27/2022    4:02 PM 09/26/2021   10:42 AM 07/21/2020   11:13 AM  Depression screen PHQ 2/9  Decreased Interest 0 1 0  Down, Depressed, Hopeless 0 0 0  PHQ - 2 Score 0 1 0  Altered sleeping 0 0 0  Tired, decreased energy 1 0 1  Change in appetite 0 0 0  Feeling bad or failure about yourself  0 0 0  Trouble concentrating 0 0 0  Moving slowly or fidgety/restless 0 0 0  Suicidal thoughts 0 0 0  PHQ-9 Score 1 1 1   Difficult doing work/chores Not difficult at all Not difficult at all Not difficult at all     Social History   Tobacco Use  Smoking Status Never  Smokeless Tobacco Never   BP Readings from Last 3 Encounters:  09/21/22 (!) 148/75  09/04/22 (!) 149/69  08/03/22 (!) 156/69   Pulse Readings from Last 3 Encounters:  09/21/22 67  09/04/22 61  08/03/22 68   Wt Readings from Last 3 Encounters:  06/29/22  158 lb 3.2 oz (71.8 kg)  06/23/22 157 lb 9.6 oz (71.5 kg)  06/08/22 159 lb 3.2 oz (72.2 kg)   BMI Readings from Last 3 Encounters:  06/29/22 30.90 kg/m  06/23/22 30.78 kg/m  06/08/22 31.09 kg/m    Allergies  Allergen Reactions   Penicillins Nausea  And Vomiting and Other (See Comments)    Has patient had a PCN reaction causing immediate rash, facial/tongue/throat swelling, SOB or lightheadedness with hypotension: Y Has patient had a PCN reaction causing severe rash involving mucus membranes or skin necrosis: Y Has patient had a PCN reaction that required hospitalization: N Has patient had a PCN reaction occurring within the last 10 years: N If all of the above answers are "NO", then may proceed with Cephalosporin use.    Aspirin Other (See Comments)    Wheezing Acetaminophen is OK    Medications Reviewed Today     Reviewed by Ephraim Hamburger, RN (Registered Nurse) on 09/21/22 at 1448  Med List Status: <None>   Medication Order Taking? Sig Documenting Provider Last Dose Status Informant  acetaminophen (TYLENOL) 500 MG tablet [id number]  Take 500 mg by mouth in the morning and at bedtime. [provider]  Active Self  albuterol (PROVENTIL) (2.5 MG/3ML) 0.083% nebulizer solution [id number]  Take 3 mLs (2.5 mg total) by nebulization every 6 (six) hours as needed for shortness of breath or wheezing. Carlisle Beers, FNP  Active   albuterol (VENTOLIN HFA) 108 (90 Base) MCG/ACT inhaler [id number]  USE 2 PUFFS EVERY 6 HOURS AS NEEDED FOR WHEEZING. Leslye Peer, MD  Active   amLODipine (NORVASC) 5 MG tablet [id number]  Take 1 tablet (5 mg total) by mouth daily. Nahser, Deloris Ping, MD  Active   atorvastatin (LIPITOR) 40 MG tablet [id number]  Take 1 tablet (40 mg total) by mouth daily. Nahser, Deloris Ping, MD  Active   azaTHIOprine (IMURAN) 50 MG tablet [id number]  Take 25 mg by mouth at bedtime. [provider]  Active Self  Azelastine-Fluticasone 137-50 MCG/ACT Santina Evans  [id number]  PLACE ONE SPRAY IN EACH NOSTRIL TWICE DAILY (IN THE MORNING AND AT BEDTIME) Leslye Peer, MD  Active   Budeson-Glycopyrrol-Formoterol (BREZTRI AEROSPHERE) 160-9-4.8 MCG/ACT AERO [id number]  INHALE 2 PUFFS INTO THE LUNGS TWICE DAILY, IN THE MORNING & AT BEDTIME. Leslye Peer, MD  Active   carvedilol (COREG) 6.25 MG tablet [id number]  Take 6.25 mg by mouth daily. [provider]  Active Self  cetirizine (ZYRTEC) 10 MG tablet [id number]  Take 10 mg by mouth daily. [provider]  Active Self  citalopram (CELEXA) 20 MG tablet [id number]  Take 1 tablet (20 mg total) by mouth every evening. Panosh, Neta Mends, MD  Active   cloNIDine (CATAPRES) 0.1 MG tablet [id number]  Take 1 tablet every 12 hours as needed for blood pressures greater than 180 systolic  Patient taking differently: Take 0.1 mg by mouth daily.   Geoffery Lyons, MD  Active Self  clopidogrel (PLAVIX) 75 MG tablet [id number]  TAKE ONE TABLET BY MOUTH ONCE DAILY. Panosh, Neta Mends, MD  Active   diclofenac Sodium (VOLTAREN) 1 % GEL [id number]  Apply 4 g topically 2 (two) times daily as needed (painful areas). Ellsworth Lennox, PA-C  Active   doxazosin (CARDURA) 2 MG tablet [id number]  TAKE THREE TABLETS BY MOUTH AT BEDTIME Panosh, Neta Mends, MD  Active   ferrous sulfate 325 (65 FE) MG EC tablet [id number]  Take 1 tablet (325 mg total) by mouth daily with breakfast. Panosh, Neta Mends, MD  Active Child, Self  fluticasone (FLONASE) 50 MCG/ACT nasal spray [id number]  Place 2 sprays into both nostrils daily. Leslye Peer, MD  Active Self  furosemide (LASIX) 40 MG tablet [id number]  Take 40-80 mg by mouth See admin instructions. 80 mg in the am  and 40 mg in the pm [provider]  Active Self  hydrALAZINE (APRESOLINE) 50 MG tablet [id number]  Take 1&1/2 tablets (75 mg total) by mouth 3 (three) times daily. Nahser, Deloris Ping, MD  Active   LORazepam (ATIVAN) 0.5 MG tablet [id number]  Take 1 tablet (0.5 mg total) by mouth 2 (two) times  daily as needed. for anxiety Panosh, Neta Mends, MD  Active   montelukast (SINGULAIR) 10 MG tablet [id number]  TAKE ONE TABLET BY MOUTH ONCE DAILY Worthy Rancher B, FNP  Active   ondansetron (ZOFRAN-ODT) 8 MG disintegrating tablet [id number]  8mg  ODT q4 hours prn nausea Dione Booze, MD  Active   pantoprazole (PROTONIX) 40 MG tablet [id number]  TAKE ONE TABLET BY MOUTH TWICE DAILY Panosh, Neta Mends, MD  Active   potassium chloride (KLOR-CON) 10 MEQ tablet [id number]  TAKE FOUR TABLETS BY MOUTH DAILY. Panosh, Neta Mends, MD  Active   predniSONE (DELTASONE) 20 MG tablet [id number]  Take 1 tablet (20 mg total) by mouth daily with breakfast. Worthy Rancher B, FNP  Active   RESTASIS 0.05 % ophthalmic emulsion [id number]  1 drop 2 (two) times daily. [provider]  Active   Spacer/Aero-Holding Chambers (AEROCHAMBER PLUS) inhaler [id number]  Use as instructed Domenick Gong, MD  Active Child, Self  vitamin B-12 (CYANOCOBALAMIN) 1000 MCG tablet [id number]  Take 1,000 mcg by mouth daily. [provider]  Active Child, Self  Vitamin D, Cholecalciferol, 25 MCG (1000 UT) TABS [id number]  Take 1,000 Units by mouth daily. [provider]  Active Self            SDOH:  (Social Determinants of Health) assessments and interventions performed: Yes SDOH Interventions    Flowsheet Row Chronic Care Management from 04/14/2022 in [facility] [place] Office Visit from 07/21/2020 in [facility] [place] Chronic Care Management from 12/31/2019 in [facility] [place]  SDOH Interventions     Transportation Interventions -- -- Intervention Not Indicated  Depression Interventions/Treatment  -- PHQ2-9 Score <4 Follow-up Not Indicated --  Financial Strain Interventions Intervention Not Indicated -- Intervention Not Indicated       Medication Assistance: None required.  Patient affirms current coverage meets needs.  Medication  Access: Name and location of current pharmacy:  [facility] [place], [place] - [address] [place] [address] Phone: [phone] Fax: [phone]  Within the past 30 days, how often has patient missed a dose of medication? None Is a pillbox or other method used to improve adherence? Yes  Factors that may affect medication adherence? no barriers identified Are meds synced by current pharmacy? No  Are meds delivered by current pharmacy? No  Does patient experience delays in picking up medications due to transportation concerns? No   Compliance/Adherence/Medication fill history: Care Gaps: Foot Exam - never done Eye Exam - last 06/10/20 COVID Booster  - last 06/13/22 DEXA Scan - never done  Star-Rating Drugs: Atorvastatin 40mg  PDC 100%   Assessment/Plan Hypertension (BP goal <140/90) -Uncontrolled -Current treatment: Hydralazine 50mg  1 and 1/2 tabs TID Appropriate, Effective, Safe, Accessible Lasix 40mg  2 in am and 1 in pm Appropriate, Effective, Safe, Accessible Doxazosin 2mg  3 tabs at bedtime Appropriate, Effective, Safe, Accessible Carvedilol 6.25mg  1 daily Appropriate, Effective, Safe, Accessible Amlodipine 5mg  1 daily Appropriate, Effective, Safe, Accessible -Medications previously tried: Metoprolol, Olmesartan, Tribenzor, Verapamil -Current home readings: has a BP machine, had not been  using regularly, 156/66 during appt today -Current dietary habits: mindful of salt intake -Current exercise habits: Not discussed -Denies hypotensive/hypertensive symptoms -Educated on BP goals and benefits of medications for prevention of heart attack, stroke and kidney damage; Daily salt intake goal < 2300 mg; Exercise goal of 150 minutes per week; Importance of home blood pressure monitoring; Proper BP monitoring technique; -Counseled to monitor BP at home once to twice daily, document, and provide log at future  appointments -Recommended to continue current medication -Consider increasing amlodipine to 10mg  for better BP control, managed by cardio, sees PCP on 10/31/22  Asthma (Goal: control symptoms and prevent exacerbations) -Uncontrolled -Current treatment  Albuterol 0.083% neb 1 vial q 6 h prn Twice daily for last 2 weeks Albuterol HFA 2 puffs q 6 h prn Appropriate, Effective, Safe, Accessible Max dose daily for last 2 weeks Breztri 2 puffs BID Appropriate, Effective, Safe, Accessible Zyrtec 10mg  1 qd Appropriate, Effective, Safe, Accessible Dymista 1 spray each nostril BID Appropriate, Effective, Safe, Accessible Montelukast 10mg  1 qd Appropriate, Effective, Safe, Accessible -Medications previously tried: Advair  -Exacerbations requiring treatment in last 6 months: 4+ -Patient reports consistent use of maintenance inhaler -Frequency of rescue inhaler use: 4x/day -Counseled on Proper inhaler technique; Benefits of consistent maintenance inhaler use When to use rescue inhaler Differences between maintenance and rescue inhalers -Recommended closer f/u with pulmonary care doctor, consideration: Airsupra for exacerbation, consider switching to trelegy 200 to see if this provides better control  Sherrill Raring Clinical Pharmacist [phone]

## 2022-10-20 ENCOUNTER — Telehealth: Payer: Self-pay

## 2022-10-20 NOTE — Progress Notes (Signed)
Care Management & Coordination Services Pharmacy Team  Reason for Encounter: Appointment Reminder  Contacted patient to confirm telephone appointment with Delano Metz, PharmD on 10/23/2022 at 3:30. Unsuccessful outreach. Left voicemail for patient to return call.  Care Gaps: AWV - completed 04/27/2022 Last BP - 170/66 on 06/29/2022 Last A1C - 5.3 on 06/29/2022 Last eye exam - 06/10/2020 Last foot exam - never done Dexa scan - never done Covid - overdue   Star Rating Drug: Atorvastatin 40 mg - last filled 07/19/2022 90 DS at [facility] [place] CMA  Clinical Pharmacist Assistant [phone]

## 2022-10-21 ENCOUNTER — Ambulatory Visit (HOSPITAL_COMMUNITY)
Admission: EM | Admit: 2022-10-21 | Discharge: 2022-10-21 | Disposition: A | Discharge status: Home / Self Care | Payer: Medicare Other | Attending: Emergency Medicine | Admitting: Emergency Medicine

## 2022-10-21 ENCOUNTER — Other Ambulatory Visit: Payer: Self-pay

## 2022-10-21 ENCOUNTER — Encounter (HOSPITAL_COMMUNITY): Payer: Self-pay | Admitting: *Deleted

## 2022-10-21 DIAGNOSIS — Z1152 Encounter for screening for COVID-19: Secondary | ICD-10-CM | POA: Insufficient documentation

## 2022-10-21 DIAGNOSIS — J4541 Moderate persistent asthma with (acute) exacerbation: Secondary | ICD-10-CM | POA: Insufficient documentation

## 2022-10-21 MED ORDER — AZITHROMYCIN 250 MG PO TABS: 250.0000 mg | ORAL_TABLET | Freq: Every day | ORAL | 0 refills | Status: DC

## 2022-10-21 MED ORDER — METHYLPREDNISOLONE ACETATE 80 MG/ML IJ SUSP
INTRAMUSCULAR | Status: AC
  Filled 2022-10-21: qty 1

## 2022-10-21 MED ORDER — PREDNISONE 50 MG PO TABS: 50.0000 mg | ORAL_TABLET | Freq: Every day | ORAL | 0 refills | Status: AC

## 2022-10-21 MED ORDER — METHYLPREDNISOLONE ACETATE 80 MG/ML IJ SUSP
80.0000 mg | Freq: Once | INTRAMUSCULAR | Status: AC
  Administered 2022-10-21: 80 mg via INTRAMUSCULAR

## 2022-10-21 MED ORDER — IPRATROPIUM-ALBUTEROL 0.5-2.5 (3) MG/3ML IN SOLN
RESPIRATORY_TRACT | Status: AC
  Filled 2022-10-21: qty 3

## 2022-10-21 MED ORDER — IPRATROPIUM-ALBUTEROL 0.5-2.5 (3) MG/3ML IN SOLN
3.0000 mL | Freq: Once | RESPIRATORY_TRACT | Status: AC
  Administered 2022-10-21: 3 mL via RESPIRATORY_TRACT

## 2022-10-21 NOTE — ED Provider Notes (Signed)
[facility]    CSN: [id number] Arrival date & time: 10/21/22  1712      History   Chief Complaint Chief Complaint  Patient presents with   Asthma    HPI [name] is a 89 y.o. female.   Patient presents for evaluation of a nonproductive cough, shortness of breath at rest and wheezing beginning 1 day ago.  Tolerating food and liquids.  No known sick contacts.  Has attempted use of nebulizers and others with no improvement.  Denies fever chills body aches, ear pain, sore throat, chest pain or tightness.  History of asthma.  Past Medical History:  Diagnosis Date   Acute superficial venous thrombosis of left lower extremity 03/27/2014   concern about hx and potential extensive on exam tenderness calf and low dose lovenox 40 qd 2-4 weekselevetion and close  fu advised .    Allergy    Anemia    Anxiety    Arthritis    "shoulders" (09/09/2012)   Asthma 09/08/2012   Carotid bruit    [place] 2018  low risk 1 - 39%   Cataract    bil cateracts removed   Chronic kidney disease    Clotting disorder (HCC)    blood clots in legs   Diabetes mellitus without complication (HCC)    "Borderline" per pt; being monitored.  no meds per pt   Diverticulosis    Dyspnea 04/27/2014   GERD (gastroesophageal reflux disease)    Headache(784.0)    "related to my high blood pressure" (09/09/2012)   Hiatal hernia    History of DVT (deep vein thrombosis)    "RLE" (09/09/2012) coumadine cant take asa so on plavix    HTN (hypertension) 09/08/2012   Hyperlipidemia    Lupus (HCC)    "cured years ago" (09/09/2012)   Other dysphagia 02/11/2013   Syncope and collapse 01/21/2018   Varicose veins    Varicose veins of leg with complications 12/05/2010    Patient Active Problem List   Diagnosis Date Noted   Pulmonary hypertension, unspecified (HCC) 12/06/2021   Chronic cough 10/18/2021   Chronic rhinitis 11/24/2019   Nausea 11/03/2018   Elevated troponin 11/03/2018   Hyponatremia  11/03/2018   Hypertensive heart disease with hypertensive chronic kidney disease 11/30/2017   Fasting hyperglycemia 11/30/2017   Renal cyst 03/27/2016   CKD (chronic kidney disease) stage 3, GFR 30-59 ml/min (HCC) 08/23/2015   Perceived hearing changes 06/29/2014   Resistant hypertension 06/29/2014   Lumbago 06/15/2014   Pre-diabetes vs early DM  06/15/2014   Dyspnea 04/27/2014   Asthma, chronic 04/13/2014   Elevated uric acid in blood 04/13/2014   Essential hypertension 03/27/2014   History of DVT (deep vein thrombosis)    Palpitations 01/05/2014   Anemia of chronic disease Jan 11, 2014   Death of family member 09-15-13   Leg edema 06/20/2013   Bereavement due to life event 04/21/2013   Other dysphagia 02/11/2013   Colon cancer screening 02/11/2013   Anxiety state 01/20/2013   Leg cramps 01/20/2013   Back pain 12/05/2012   Family hx of colon cancer 10/21/2012   Family hx of lung cancer 10/21/2012   Family hx-breast malignancy 10/21/2012   Renal insufficiency creatinine 1.3 10/21/2012   Anemia, chronic disease 10/21/2012   GERD (gastroesophageal reflux disease) 09/08/2012   HTN (hypertension) 09/08/2012   Hyperlipidemia 09/08/2012   Varicose veins of leg with complications 12/05/2010    Past Surgical History:  Procedure Laterality Date   ABDOMINAL HYSTERECTOMY  partial   BREAST EXCISIONAL BIOPSY Right    BREAST SURGERY     CARPAL TUNNEL RELEASE Right 2000's   CARPAL TUNNEL RELEASE Bilateral 10/21/2013   Procedure: BILATERAL  CARPAL TUNNEL RELEASE;  Surgeon: Nicki Reaper, MD;  Location: [facility];  Service: Orthopedics;  Laterality: Bilateral;   COLONOSCOPY     SHOULDER OPEN ROTATOR CUFF REPAIR Bilateral 2000's   TRIGGER FINGER RELEASE Right 10/21/2013   Procedure: RELEASE TRIGGER FINGER/A-1 PULLEY RIGHT MIDDLE AND RIGHT RING;  Surgeon: Nicki Reaper, MD;  Location: [facility];  Service: Orthopedics;  Laterality: Right;   UPPER  GASTROINTESTINAL ENDOSCOPY      OB History     Gravida  7   Para  7   Term      Preterm      AB      Living         SAB      IAB      Ectopic      Multiple      Live Births           Obstetric Comments  Lost 2 children soon after birth          Home Medications    Prior to Admission medications   Medication Sig Start Date End Date Taking? Authorizing Provider  acetaminophen (TYLENOL) 500 MG tablet Take 500 mg by mouth in the morning and at bedtime.   Yes [provider]  albuterol (PROVENTIL) (2.5 MG/3ML) 0.083% nebulizer solution Take 3 mLs (2.5 mg total) by nebulization every 6 (six) hours as needed for shortness of breath or wheezing. 08/03/22  Yes Carlisle Beers, FNP  albuterol (VENTOLIN HFA) 108 (90 Base) MCG/ACT inhaler USE 2 PUFFS EVERY 6 HOURS AS NEEDED FOR WHEEZING. 08/07/22  Yes Byrum, Les Pou, MD  amLODipine (NORVASC) 5 MG tablet Take 1 tablet (5 mg total) by mouth daily. 03/07/22  Yes Nahser, Deloris Ping, MD  atorvastatin (LIPITOR) 40 MG tablet Take 1 tablet (40 mg total) by mouth daily. 04/21/22  Yes Nahser, Deloris Ping, MD  azaTHIOprine (IMURAN) 50 MG tablet Take 25 mg by mouth at bedtime. 04/09/21  Yes [provider]  Azelastine-Fluticasone 137-50 MCG/ACT SUSP PLACE ONE SPRAY IN EACH NOSTRIL TWICE DAILY (IN THE MORNING AND AT BEDTIME) 01/19/22  Yes Byrum, Les Pou, MD  Budeson-Glycopyrrol-Formoterol (BREZTRI AEROSPHERE) 160-9-4.8 MCG/ACT AERO INHALE 2 PUFFS INTO THE LUNGS TWICE DAILY, IN THE MORNING & AT BEDTIME. 06/22/22  Yes Byrum, Les Pou, MD  carvedilol (COREG) 6.25 MG tablet Take 6.25 mg by mouth daily.   Yes [provider]  cetirizine (ZYRTEC) 10 MG tablet Take 10 mg by mouth daily.   Yes [provider]  citalopram (CELEXA) 20 MG tablet Take 1 tablet (20 mg total) by mouth every evening. 09/12/22  Yes Panosh, Neta Mends, MD  cloNIDine (CATAPRES) 0.1 MG tablet Take 1 tablet every 12 hours as needed for blood  pressures greater than 180 systolic Patient taking differently: Take 0.1 mg by mouth daily. 09/10/21  Yes Delo, Riley Lam, MD  clopidogrel (PLAVIX) 75 MG tablet TAKE ONE TABLET BY MOUTH ONCE DAILY. 10/19/22  Yes Panosh, Neta Mends, MD  diclofenac Sodium (VOLTAREN) 1 % GEL Apply 4 g topically 2 (two) times daily as needed (painful areas). 05/02/22  Yes Ellsworth Lennox, PA-C  doxazosin (CARDURA) 2 MG tablet TAKE THREE TABLETS BY MOUTH AT BEDTIME 08/22/22  Yes Panosh, Neta Mends, MD  ferrous sulfate 325 (65  FE) MG EC tablet Take 1 tablet (325 mg total) by mouth daily with breakfast. 12/15/14  Yes Panosh, Neta Mends, MD  fluticasone (FLONASE) 50 MCG/ACT nasal spray Place 2 sprays into both nostrils daily. 10/20/19  Yes Leslye Peer, MD  furosemide (LASIX) 40 MG tablet Take 40-80 mg by mouth See admin instructions. 80 mg in the am and 40 mg in the pm   Yes [provider]  hydrALAZINE (APRESOLINE) 50 MG tablet Take 1&1/2 tablets (75 mg total) by mouth 3 (three) times daily. 04/12/22  Yes Nahser, Deloris Ping, MD  LORazepam (ATIVAN) 0.5 MG tablet Take 1 tablet (0.5 mg total) by mouth 2 (two) times daily as needed. for anxiety 09/17/22  Yes Panosh, Neta Mends, MD  montelukast (SINGULAIR) 10 MG tablet TAKE ONE TABLET BY MOUTH ONCE DAILY 08/06/22  Yes Worthy Rancher B, FNP  ondansetron (ZOFRAN-ODT) 8 MG disintegrating tablet 8mg  ODT q4 hours prn nausea 09/12/21  Yes Dione Booze, MD  pantoprazole (PROTONIX) 40 MG tablet TAKE ONE TABLET BY MOUTH TWICE DAILY 08/22/22  Yes Panosh, Neta Mends, MD  potassium chloride (KLOR-CON) 10 MEQ tablet TAKE FOUR TABLETS BY MOUTH DAILY. 10/19/22  Yes Panosh, Neta Mends, MD  RESTASIS 0.05 % ophthalmic emulsion 1 drop 2 (two) times daily. 10/13/21  Yes [provider]  Spacer/Aero-Holding Chambers (AEROCHAMBER PLUS) inhaler Use as instructed 05/26/16  Yes Domenick Gong, MD  vitamin B-12 (CYANOCOBALAMIN) 1000 MCG tablet Take 1,000 mcg by mouth daily.   Yes [provider]  Vitamin D,  Cholecalciferol, 25 MCG (1000 UT) TABS Take 1,000 Units by mouth daily.   Yes [provider]    Family History Family History  Problem Relation Age of Onset   Hypertension Mother    Lung cancer Mother    Hypertension Father    Lung cancer Father    Breast cancer Sister    Colon cancer Sister        ? 39' s dx - died in 50's   Breast cancer Sister    Hypertension Brother    Rectal cancer Brother    Stomach cancer Brother    Breast cancer Brother    Heart attack Daughter    Esophageal cancer Daughter    Hypertension Daughter    Diabetes Daughter    Breast cancer Paternal Aunt    Lung cancer Other        both parents    Pancreatic cancer Neg Hx     Social History Social History   Tobacco Use   Smoking status: Never   Smokeless tobacco: Never  Vaping Use   Vaping Use: Never used  Substance Use Topics   Alcohol use: No   Drug use: No     Allergies   Penicillins and Aspirin   Review of Systems Review of Systems   Physical Exam Triage Vital Signs ED Triage Vitals [10/21/22 1731]  Enc Vitals Group     BP (!) 191/68     Pulse Rate 90     Resp (!) 22     Temp (!) 100.5 F (38.1 C)     Temp src      SpO2 93 %     Weight      Height      Head Circumference      Peak Flow      Pain Score 8     Pain Loc      Pain Edu?      Excl. in GC?  No data found.  Updated Vital Signs BP (!) 191/68   Pulse 90   Temp (!) 100.5 F (38.1 C)   Resp (!) 22   SpO2 93%   Visual Acuity Right Eye Distance:   Left Eye Distance:   Bilateral Distance:    Right Eye Near:   Left Eye Near:    Bilateral Near:     Physical Exam Constitutional:      Appearance: Normal appearance.  Eyes:     Extraocular Movements: Extraocular movements intact.  Cardiovascular:     Rate and Rhythm: Normal rate and regular rhythm.     Pulses: Normal pulses.     Heart sounds: Normal heart sounds.  Pulmonary:     Breath sounds: Wheezing present.     Comments: Labored  breathing  Neurological:     Mental Status: She is alert and oriented to person, place, and time. Mental status is at baseline.      UC Treatments / Results  Labs (all labs ordered are listed, but only abnormal results are displayed) Labs Reviewed - No data to display  EKG   Radiology No results found.  Procedures Procedures (including critical care time)  Medications Ordered in UC Medications - No data to display  Initial Impression / Assessment and Plan / UC Course  I have reviewed the triage vital signs and the nursing notes.  Pertinent labs & imaging results that were available during my care of the patient were reviewed by me and considered in my medical decision making (see chart for details).  Moderate Persistent asthma with acute exacerbation   Triage fever of 100.5, no signs of viral infection, wheezing is auditory and breathing is labored but she is in no signs of distress, O2 saturation greater than 90%, DuoNeb and methylprednisolone injection given in office, on reevaluation symptoms have begun to improve, prescribed prednisone for outpatient use and given azithromycin to provide coverage for bacteria as fever is present on exam, COVID swab completed, if positive will prescribe antiviral, may use additional over-the-counter medications as needed with follow-up with PCP or pulmonologist if symptoms continue to persist Final Clinical Impressions(s) / UC Diagnoses   Final diagnoses:  None   Discharge Instructions   None    ED Prescriptions   None    PDMP not reviewed this encounter.   Valinda Hoar, [place] 10/21/22 [phone]

## 2022-10-21 NOTE — Discharge Instructions (Addendum)
Today you are being treated for inflammation to your upper airways  Since you have a fever we will check for COVID today, test pending for up to 24 hours, you will be notified of positive test results only, if positive you will receive antiviral treatment which will help minimize any symptoms  Given an injection of steroids here in the office and a breathing treatment  Use of azithromycin every morning as directed to provide coverage for bacteria and ensure there are no germs that are aiding to your symptoms  Begin use of prednisone every morning with food for 5 days to help reduce inflammation   Continue use of inhalers as directed  In addition:  Maintaining adequate hydration may help to thin secretions and soothe the respiratory mucosa   Warm Liquids- Ingestion of warm liquids may have a soothing effect on the respiratory mucosa, increase the flow of nasal mucus, and loosen respiratory secretions, making them easier to remove  May try honey (2.5 to 5 mL [0.5 to 1 teaspoon]) can be given straight or diluted in liquid (juice). Corn syrup may be substituted if honey is not available.

## 2022-10-21 NOTE — ED Triage Notes (Signed)
Pt reports  SHOB ,wheezing and asthma . Pt has been using home meds with out relief.

## 2022-10-22 LAB — SARS CORONAVIRUS 2 (TAT 6-24 HRS): SARS Coronavirus 2: NEGATIVE

## 2022-10-23 ENCOUNTER — Ambulatory Visit: Payer: Medicare Other

## 2022-10-23 ENCOUNTER — Other Ambulatory Visit: Payer: Self-pay | Admitting: Cardiovascular Disease

## 2022-10-24 DIAGNOSIS — Z79899 Other long term (current) drug therapy: Secondary | ICD-10-CM | POA: Diagnosis not present

## 2022-10-24 DIAGNOSIS — H44113 Panuveitis, bilateral: Secondary | ICD-10-CM | POA: Diagnosis not present

## 2022-10-24 DIAGNOSIS — H3581 Retinal edema: Secondary | ICD-10-CM | POA: Diagnosis not present

## 2022-10-24 DIAGNOSIS — H35033 Hypertensive retinopathy, bilateral: Secondary | ICD-10-CM | POA: Diagnosis not present

## 2022-10-30 ENCOUNTER — Inpatient Hospital Stay: Payer: Medicare Other | Attending: Hematology

## 2022-10-30 ENCOUNTER — Other Ambulatory Visit: Payer: Self-pay

## 2022-10-30 ENCOUNTER — Inpatient Hospital Stay (HOSPITAL_COMMUNITY)
Admission: EM | Admit: 2022-10-30 | Discharge: 2022-11-06 | DRG: 291 | Disposition: A | Discharge status: Home / Self Care | Payer: Medicare Other | Attending: Internal Medicine | Admitting: Internal Medicine

## 2022-10-30 ENCOUNTER — Inpatient Hospital Stay: Payer: Medicare Other

## 2022-10-30 VITALS — BP 164/61 | HR 85 | Temp 98.5°F | Resp 24

## 2022-10-30 DIAGNOSIS — D638 Anemia in other chronic diseases classified elsewhere: Secondary | ICD-10-CM | POA: Diagnosis not present

## 2022-10-30 DIAGNOSIS — F411 Generalized anxiety disorder: Secondary | ICD-10-CM | POA: Diagnosis present

## 2022-10-30 DIAGNOSIS — N184 Chronic kidney disease, stage 4 (severe): Secondary | ICD-10-CM | POA: Diagnosis present

## 2022-10-30 DIAGNOSIS — T380X5A Adverse effect of glucocorticoids and synthetic analogues, initial encounter: Secondary | ICD-10-CM | POA: Diagnosis not present

## 2022-10-30 DIAGNOSIS — R0602 Shortness of breath: Secondary | ICD-10-CM | POA: Diagnosis not present

## 2022-10-30 DIAGNOSIS — M329 Systemic lupus erythematosus, unspecified: Secondary | ICD-10-CM | POA: Diagnosis present

## 2022-10-30 DIAGNOSIS — N179 Acute kidney failure, unspecified: Secondary | ICD-10-CM | POA: Diagnosis not present

## 2022-10-30 DIAGNOSIS — R609 Edema, unspecified: Secondary | ICD-10-CM | POA: Diagnosis not present

## 2022-10-30 DIAGNOSIS — Z800 Family history of malignant neoplasm of digestive organs: Secondary | ICD-10-CM

## 2022-10-30 DIAGNOSIS — E669 Obesity, unspecified: Secondary | ICD-10-CM | POA: Diagnosis present

## 2022-10-30 DIAGNOSIS — K219 Gastro-esophageal reflux disease without esophagitis: Secondary | ICD-10-CM | POA: Diagnosis present

## 2022-10-30 DIAGNOSIS — B348 Other viral infections of unspecified site: Secondary | ICD-10-CM | POA: Diagnosis not present

## 2022-10-30 DIAGNOSIS — Z8249 Family history of ischemic heart disease and other diseases of the circulatory system: Secondary | ICD-10-CM | POA: Diagnosis not present

## 2022-10-30 DIAGNOSIS — Z6830 Body mass index (BMI) 30.0-30.9, adult: Secondary | ICD-10-CM

## 2022-10-30 DIAGNOSIS — D649 Anemia, unspecified: Secondary | ICD-10-CM | POA: Diagnosis not present

## 2022-10-30 DIAGNOSIS — D631 Anemia in chronic kidney disease: Secondary | ICD-10-CM | POA: Diagnosis not present

## 2022-10-30 DIAGNOSIS — I10 Essential (primary) hypertension: Secondary | ICD-10-CM

## 2022-10-30 DIAGNOSIS — M7122 Synovial cyst of popliteal space [Baker], left knee: Secondary | ICD-10-CM | POA: Diagnosis present

## 2022-10-30 DIAGNOSIS — I2721 Secondary pulmonary arterial hypertension: Secondary | ICD-10-CM | POA: Diagnosis present

## 2022-10-30 DIAGNOSIS — E785 Hyperlipidemia, unspecified: Secondary | ICD-10-CM | POA: Diagnosis not present

## 2022-10-30 DIAGNOSIS — I5033 Acute on chronic diastolic (congestive) heart failure: Secondary | ICD-10-CM | POA: Diagnosis present

## 2022-10-30 DIAGNOSIS — F419 Anxiety disorder, unspecified: Secondary | ICD-10-CM | POA: Diagnosis present

## 2022-10-30 DIAGNOSIS — E872 Acidosis, unspecified: Secondary | ICD-10-CM | POA: Diagnosis not present

## 2022-10-30 DIAGNOSIS — M7989 Other specified soft tissue disorders: Secondary | ICD-10-CM | POA: Diagnosis not present

## 2022-10-30 DIAGNOSIS — I5030 Unspecified diastolic (congestive) heart failure: Secondary | ICD-10-CM | POA: Diagnosis present

## 2022-10-30 DIAGNOSIS — I130 Hypertensive heart and chronic kidney disease with heart failure and stage 1 through stage 4 chronic kidney disease, or unspecified chronic kidney disease: Principal | ICD-10-CM | POA: Diagnosis present

## 2022-10-30 DIAGNOSIS — I129 Hypertensive chronic kidney disease with stage 1 through stage 4 chronic kidney disease, or unspecified chronic kidney disease: Secondary | ICD-10-CM | POA: Diagnosis not present

## 2022-10-30 DIAGNOSIS — J440 Chronic obstructive pulmonary disease with acute lower respiratory infection: Secondary | ICD-10-CM | POA: Diagnosis present

## 2022-10-30 DIAGNOSIS — J4540 Moderate persistent asthma, uncomplicated: Secondary | ICD-10-CM | POA: Diagnosis not present

## 2022-10-30 DIAGNOSIS — E1122 Type 2 diabetes mellitus with diabetic chronic kidney disease: Secondary | ICD-10-CM | POA: Diagnosis present

## 2022-10-30 DIAGNOSIS — Z880 Allergy status to penicillin: Secondary | ICD-10-CM | POA: Diagnosis not present

## 2022-10-30 DIAGNOSIS — Z803 Family history of malignant neoplasm of breast: Secondary | ICD-10-CM

## 2022-10-30 DIAGNOSIS — N185 Chronic kidney disease, stage 5: Secondary | ICD-10-CM | POA: Diagnosis present

## 2022-10-30 DIAGNOSIS — Z79899 Other long term (current) drug therapy: Secondary | ICD-10-CM | POA: Diagnosis not present

## 2022-10-30 DIAGNOSIS — E782 Mixed hyperlipidemia: Secondary | ICD-10-CM | POA: Diagnosis not present

## 2022-10-30 DIAGNOSIS — J4541 Moderate persistent asthma with (acute) exacerbation: Secondary | ICD-10-CM | POA: Diagnosis not present

## 2022-10-30 DIAGNOSIS — J122 Parainfluenza virus pneumonia: Secondary | ICD-10-CM | POA: Diagnosis present

## 2022-10-30 DIAGNOSIS — Z1152 Encounter for screening for COVID-19: Secondary | ICD-10-CM | POA: Diagnosis not present

## 2022-10-30 DIAGNOSIS — Z7902 Long term (current) use of antithrombotics/antiplatelets: Secondary | ICD-10-CM

## 2022-10-30 DIAGNOSIS — I160 Hypertensive urgency: Secondary | ICD-10-CM | POA: Diagnosis present

## 2022-10-30 DIAGNOSIS — N183 Chronic kidney disease, stage 3 unspecified: Secondary | ICD-10-CM

## 2022-10-30 DIAGNOSIS — Z86718 Personal history of other venous thrombosis and embolism: Secondary | ICD-10-CM

## 2022-10-30 DIAGNOSIS — Z801 Family history of malignant neoplasm of trachea, bronchus and lung: Secondary | ICD-10-CM

## 2022-10-30 DIAGNOSIS — J189 Pneumonia, unspecified organism: Secondary | ICD-10-CM | POA: Diagnosis present

## 2022-10-30 DIAGNOSIS — Z886 Allergy status to analgesic agent status: Secondary | ICD-10-CM

## 2022-10-30 DIAGNOSIS — Z7951 Long term (current) use of inhaled steroids: Secondary | ICD-10-CM

## 2022-10-30 DIAGNOSIS — M19011 Primary osteoarthritis, right shoulder: Secondary | ICD-10-CM | POA: Diagnosis present

## 2022-10-30 DIAGNOSIS — M19012 Primary osteoarthritis, left shoulder: Secondary | ICD-10-CM | POA: Diagnosis present

## 2022-10-30 DIAGNOSIS — Z833 Family history of diabetes mellitus: Secondary | ICD-10-CM

## 2022-10-30 LAB — CBC WITH DIFFERENTIAL (CANCER CENTER ONLY)
Abs Immature Granulocytes: 0.2 10*3/uL — ABNORMAL HIGH (ref 0.00–0.07)
Basophils Absolute: 0 10*3/uL (ref 0.0–0.1)
Basophils Relative: 0 %
Eosinophils Absolute: 0.2 10*3/uL (ref 0.0–0.5)
Eosinophils Relative: 4 %
HCT: 28 % — ABNORMAL LOW (ref 36.0–46.0)
Hemoglobin: 8.7 g/dL — ABNORMAL LOW (ref 12.0–15.0)
Immature Granulocytes: 4 %
Lymphocytes Relative: 17 %
Lymphs Abs: 1 10*3/uL (ref 0.7–4.0)
MCH: 29.1 pg (ref 26.0–34.0)
MCHC: 31.1 g/dL (ref 30.0–36.0)
MCV: 93.6 fL (ref 80.0–100.0)
Monocytes Absolute: 0.4 10*3/uL (ref 0.1–1.0)
Monocytes Relative: 7 %
Neutro Abs: 3.9 10*3/uL (ref 1.7–7.7)
Neutrophils Relative %: 68 %
Platelet Count: 181 10*3/uL (ref 150–400)
RBC: 2.99 MIL/uL — ABNORMAL LOW (ref 3.87–5.11)
RDW: 15.3 % (ref 11.5–15.5)
WBC Count: 5.7 10*3/uL (ref 4.0–10.5)
nRBC: 0 % (ref 0.0–0.2)

## 2022-10-30 MED ORDER — DARBEPOETIN ALFA 100 MCG/0.5ML IJ SOSY
100.0000 ug | PREFILLED_SYRINGE | Freq: Once | INTRAMUSCULAR | Status: AC
  Administered 2022-10-30: 100 ug via SUBCUTANEOUS
  Filled 2022-10-30: qty 0.5

## 2022-10-30 NOTE — Progress Notes (Deleted)
No chief complaint on file.   HPI: [name] 89 y.o. come in for Chronic disease management  Had astham eexac  June 8  Eye dx panuveitis  with retinal edema  put on cepcept   Dr Sherryll Burger Renal cr 3.27 ROS: See pertinent positives and negatives per HPI.  Past Medical History:  Diagnosis Date   Acute superficial venous thrombosis of left lower extremity 03/27/2014   concern about hx and potential extensive on exam tenderness calf and low dose lovenox 40 qd 2-4 weekselevetion and close  fu advised .    Allergy    Anemia    Anxiety    Arthritis    "shoulders" (09/09/2012)   Asthma 09/08/2012   Carotid bruit    [place] 2018  low risk 1 - 39%   Cataract    bil cateracts removed   Chronic kidney disease    Clotting disorder (HCC)    blood clots in legs   Diabetes mellitus without complication (HCC)    "Borderline" per pt; being monitored.  no meds per pt   Diverticulosis    Dyspnea 04/27/2014   GERD (gastroesophageal reflux disease)    Headache(784.0)    "related to my high blood pressure" (09/09/2012)   Hiatal hernia    History of DVT (deep vein thrombosis)    "RLE" (09/09/2012) coumadine cant take asa so on plavix    HTN (hypertension) 09/08/2012   Hyperlipidemia    Lupus (HCC)    "cured years ago" (09/09/2012)   Other dysphagia 02/11/2013   Syncope and collapse 01/21/2018   Varicose veins    Varicose veins of leg with complications 12/05/2010    Family History  Problem Relation Age of Onset   Hypertension Mother    Lung cancer Mother    Hypertension Father    Lung cancer Father    Breast cancer Sister    Colon cancer Sister        ? 42' s dx - died in 66's   Breast cancer Sister    Hypertension Brother    Rectal cancer Brother    Stomach cancer Brother    Breast cancer Brother    Heart attack Daughter    Esophageal cancer Daughter    Hypertension Daughter    Diabetes Daughter    Breast cancer Paternal Aunt    Lung cancer Other        both parents     Pancreatic cancer Neg Hx     Social History   Socioeconomic History   Marital status: Single    Spouse name: Not on file   Number of children: 6   Years of education: Not on file   Highest education level: Not on file  Occupational History   Occupation: retired  Tobacco Use   Smoking status: Never   Smokeless tobacco: Never  Vaping Use   Vaping Use: Never used  Substance and Sexual Activity   Alcohol use: No   Drug use: No   Sexual activity: Not Currently    Birth control/protection: Surgical  Other Topics Concern   Not on file  Social History Narrative   2 people living in the home.  Grand daughter.   Up and down through the night      Had 7 children  2 deceased . 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    works 3 days per week  currently .   Glasses dentures  Neg tad    Social Determinants of Health   Financial Resource Strain: Low Risk  (04/14/2022)   Overall Financial Resource Strain (CARDIA)    Difficulty of Paying Living Expenses: Not very hard  Food Insecurity: Not on file  Transportation Needs: No Transportation Needs (01/01/2020)   PRAPARE - Administrator, Civil Service (Medical): No    Lack of Transportation (Non-Medical): No  Physical Activity: Not on file  Stress: Not on file  Social Connections: Not on file    Outpatient Medications Prior to Visit  Medication Sig Dispense Refill   acetaminophen (TYLENOL) 500 MG tablet Take 500 mg by mouth in the morning and at bedtime.     albuterol (PROVENTIL) (2.5 MG/3ML) 0.083% nebulizer solution Take 3 mLs (2.5 mg total) by nebulization every 6 (six) hours as needed for shortness of breath or wheezing. 360 mL 6   albuterol (VENTOLIN HFA) 108 (90 Base) MCG/ACT inhaler USE 2 PUFFS EVERY 6 HOURS AS NEEDED FOR WHEEZING. 8.5 g 3   amLODipine (NORVASC) 5 MG tablet Take 1 tablet (5 mg total) by mouth daily. 90 tablet 2   atorvastatin  (LIPITOR) 40 MG tablet Take 1 tablet (40 mg total) by mouth daily. 90 tablet 3   azaTHIOprine (IMURAN) 50 MG tablet Take 25 mg by mouth at bedtime.     Azelastine-Fluticasone 137-50 MCG/ACT SUSP PLACE ONE SPRAY IN EACH NOSTRIL TWICE DAILY (IN THE MORNING AND AT BEDTIME) 23 g 6   azithromycin (ZITHROMAX) 250 MG tablet Take 1 tablet (250 mg total) by mouth daily. Take first 2 tablets together, then 1 every day until finished. 6 tablet 0   Budeson-Glycopyrrol-Formoterol (BREZTRI AEROSPHERE) 160-9-4.8 MCG/ACT AERO INHALE 2 PUFFS INTO THE LUNGS TWICE DAILY, IN THE MORNING & AT BEDTIME. 10.7 g 4   carvedilol (COREG) 6.25 MG tablet Take 6.25 mg by mouth daily.     cetirizine (ZYRTEC) 10 MG tablet Take 10 mg by mouth daily.     citalopram (CELEXA) 20 MG tablet Take 1 tablet (20 mg total) by mouth every evening. 90 tablet 0   cloNIDine (CATAPRES) 0.1 MG tablet Take 1 tablet every 12 hours as needed for blood pressures greater than 180 systolic (Patient taking differently: Take 0.1 mg by mouth daily.) 15 tablet 0   clopidogrel (PLAVIX) 75 MG tablet TAKE ONE TABLET BY MOUTH ONCE DAILY. 30 tablet 1   diclofenac Sodium (VOLTAREN) 1 % GEL Apply 4 g topically 2 (two) times daily as needed (painful areas). 50 g 1   doxazosin (CARDURA) 2 MG tablet TAKE THREE TABLETS BY MOUTH AT BEDTIME 270 tablet 1   ferrous sulfate 325 (65 FE) MG EC tablet Take 1 tablet (325 mg total) by mouth daily with breakfast. 30 tablet 3   fluticasone (FLONASE) 50 MCG/ACT nasal spray Place 2 sprays into both nostrils daily. 16 g 2   furosemide (LASIX) 40 MG tablet Take 40-80 mg by mouth See admin instructions. 80 mg in the am and 40 mg in the pm     hydrALAZINE (APRESOLINE) 50 MG tablet Take 1&1/2 tablets (75 mg total) by mouth 3 (three) times daily. 405 tablet 0   LORazepam (ATIVAN) 0.5 MG tablet Take 1 tablet (0.5 mg total) by mouth 2 (two) times daily as needed. for anxiety 30 tablet 0   montelukast (SINGULAIR) 10 MG tablet TAKE ONE TABLET  BY MOUTH ONCE DAILY 90 tablet 0   ondansetron (ZOFRAN-ODT) 8 MG disintegrating tablet  8mg  ODT q4 hours prn nausea 20 tablet 0   pantoprazole (PROTONIX) 40 MG tablet TAKE ONE TABLET BY MOUTH TWICE DAILY 180 tablet 1   potassium chloride (KLOR-CON) 10 MEQ tablet TAKE FOUR TABLETS BY MOUTH DAILY. 120 tablet 1   RESTASIS 0.05 % ophthalmic emulsion 1 drop 2 (two) times daily.     Spacer/Aero-Holding Chambers (AEROCHAMBER PLUS) inhaler Use as instructed 1 each 2   vitamin B-12 (CYANOCOBALAMIN) 1000 MCG tablet Take 1,000 mcg by mouth daily.     Vitamin D, Cholecalciferol, 25 MCG (1000 UT) TABS Take 1,000 Units by mouth daily.     No facility-administered medications prior to visit.     EXAM:  There were no vitals taken for this visit.  There is no height or weight on file to calculate BMI.  GENERAL: vitals reviewed and listed above, alert, oriented, appears well hydrated and in no acute distress HEENT: atraumatic, conjunctiva  clear, no obvious abnormalities on inspection of external nose and ears OP : no lesion edema or exudate  NECK: no obvious masses on inspection palpation  LUNGS: clear to auscultation bilaterally, no wheezes, rales or rhonchi, good air movement CV: HRRR, no clubbing cyanosis or  peripheral edema nl cap refill  MS: moves all extremities without noticeable focal  abnormality PSYCH: pleasant and cooperative, no obvious depression or anxiety Lab Results  Component Value Date   WBC 5.7 10/30/2022   HGB 8.7 (L) 10/30/2022   HCT 28.0 (L) 10/30/2022   PLT 181 10/30/2022   GLUCOSE 104 (H) 06/29/2022   CHOL 190 07/21/2022   TRIG 97 07/21/2022   HDL 92 07/21/2022   LDLCALC 81 07/21/2022   ALT 9 07/21/2022   AST 21 06/29/2022   NA 142 09/13/2022   K 4.5 09/13/2022   CL 110 (A) 09/13/2022   CREATININE 3.1 (A) 09/13/2022   BUN 56 (A) 09/13/2022   CO2 22 09/13/2022   TSH 2.85 06/29/2022   INR 0.94 12/22/2009   HGBA1C 5.3 06/29/2022   MICROALBUR 119 03/02/2021   BP  Readings from Last 3 Encounters:  10/30/22 (!) 164/61  10/21/22 (!) 191/68  09/21/22 (!) 148/75    ASSESSMENT AND PLAN:  Discussed the following assessment and plan:  No diagnosis found.  -Patient advised to return or notify health care team  if  new concerns arise.  There are no Patient Instructions on file for this visit.   Neta Mends. Greyson Peavy M.D.

## 2022-10-30 NOTE — Patient Instructions (Signed)

## 2022-10-31 ENCOUNTER — Encounter (HOSPITAL_COMMUNITY): Payer: Self-pay

## 2022-10-31 ENCOUNTER — Other Ambulatory Visit: Payer: Self-pay

## 2022-10-31 ENCOUNTER — Inpatient Hospital Stay (HOSPITAL_COMMUNITY): Payer: Medicare Other

## 2022-10-31 ENCOUNTER — Emergency Department (HOSPITAL_COMMUNITY): Payer: Medicare Other

## 2022-10-31 ENCOUNTER — Telehealth: Payer: Self-pay

## 2022-10-31 ENCOUNTER — Ambulatory Visit: Payer: Medicare Other | Admitting: Internal Medicine

## 2022-10-31 DIAGNOSIS — J440 Chronic obstructive pulmonary disease with acute lower respiratory infection: Secondary | ICD-10-CM | POA: Diagnosis present

## 2022-10-31 DIAGNOSIS — E872 Acidosis, unspecified: Secondary | ICD-10-CM | POA: Diagnosis not present

## 2022-10-31 DIAGNOSIS — T380X5A Adverse effect of glucocorticoids and synthetic analogues, initial encounter: Secondary | ICD-10-CM | POA: Diagnosis not present

## 2022-10-31 DIAGNOSIS — J4541 Moderate persistent asthma with (acute) exacerbation: Secondary | ICD-10-CM

## 2022-10-31 DIAGNOSIS — E669 Obesity, unspecified: Secondary | ICD-10-CM | POA: Diagnosis present

## 2022-10-31 DIAGNOSIS — Z880 Allergy status to penicillin: Secondary | ICD-10-CM | POA: Diagnosis not present

## 2022-10-31 DIAGNOSIS — I5033 Acute on chronic diastolic (congestive) heart failure: Secondary | ICD-10-CM | POA: Diagnosis present

## 2022-10-31 DIAGNOSIS — M329 Systemic lupus erythematosus, unspecified: Secondary | ICD-10-CM | POA: Diagnosis present

## 2022-10-31 DIAGNOSIS — M7989 Other specified soft tissue disorders: Secondary | ICD-10-CM | POA: Diagnosis not present

## 2022-10-31 DIAGNOSIS — Z1152 Encounter for screening for COVID-19: Secondary | ICD-10-CM | POA: Diagnosis not present

## 2022-10-31 DIAGNOSIS — N179 Acute kidney failure, unspecified: Secondary | ICD-10-CM | POA: Diagnosis not present

## 2022-10-31 DIAGNOSIS — I5030 Unspecified diastolic (congestive) heart failure: Secondary | ICD-10-CM | POA: Diagnosis not present

## 2022-10-31 DIAGNOSIS — F419 Anxiety disorder, unspecified: Secondary | ICD-10-CM | POA: Diagnosis present

## 2022-10-31 DIAGNOSIS — I129 Hypertensive chronic kidney disease with stage 1 through stage 4 chronic kidney disease, or unspecified chronic kidney disease: Secondary | ICD-10-CM | POA: Diagnosis not present

## 2022-10-31 DIAGNOSIS — B348 Other viral infections of unspecified site: Secondary | ICD-10-CM | POA: Diagnosis not present

## 2022-10-31 DIAGNOSIS — N184 Chronic kidney disease, stage 4 (severe): Secondary | ICD-10-CM | POA: Diagnosis present

## 2022-10-31 DIAGNOSIS — D638 Anemia in other chronic diseases classified elsewhere: Secondary | ICD-10-CM

## 2022-10-31 DIAGNOSIS — R609 Edema, unspecified: Secondary | ICD-10-CM | POA: Diagnosis not present

## 2022-10-31 DIAGNOSIS — J189 Pneumonia, unspecified organism: Secondary | ICD-10-CM

## 2022-10-31 DIAGNOSIS — I160 Hypertensive urgency: Secondary | ICD-10-CM | POA: Diagnosis present

## 2022-10-31 DIAGNOSIS — E782 Mixed hyperlipidemia: Secondary | ICD-10-CM

## 2022-10-31 DIAGNOSIS — Z86718 Personal history of other venous thrombosis and embolism: Secondary | ICD-10-CM | POA: Diagnosis not present

## 2022-10-31 DIAGNOSIS — E1122 Type 2 diabetes mellitus with diabetic chronic kidney disease: Secondary | ICD-10-CM | POA: Diagnosis present

## 2022-10-31 DIAGNOSIS — I130 Hypertensive heart and chronic kidney disease with heart failure and stage 1 through stage 4 chronic kidney disease, or unspecified chronic kidney disease: Secondary | ICD-10-CM | POA: Diagnosis not present

## 2022-10-31 DIAGNOSIS — J4540 Moderate persistent asthma, uncomplicated: Secondary | ICD-10-CM | POA: Diagnosis present

## 2022-10-31 DIAGNOSIS — Z8249 Family history of ischemic heart disease and other diseases of the circulatory system: Secondary | ICD-10-CM | POA: Diagnosis not present

## 2022-10-31 DIAGNOSIS — I10 Essential (primary) hypertension: Secondary | ICD-10-CM | POA: Diagnosis not present

## 2022-10-31 DIAGNOSIS — D631 Anemia in chronic kidney disease: Secondary | ICD-10-CM | POA: Diagnosis present

## 2022-10-31 DIAGNOSIS — Z79899 Other long term (current) drug therapy: Secondary | ICD-10-CM | POA: Diagnosis not present

## 2022-10-31 DIAGNOSIS — N185 Chronic kidney disease, stage 5: Secondary | ICD-10-CM | POA: Diagnosis present

## 2022-10-31 DIAGNOSIS — Z6830 Body mass index (BMI) 30.0-30.9, adult: Secondary | ICD-10-CM | POA: Diagnosis not present

## 2022-10-31 DIAGNOSIS — E785 Hyperlipidemia, unspecified: Secondary | ICD-10-CM | POA: Diagnosis present

## 2022-10-31 DIAGNOSIS — D649 Anemia, unspecified: Secondary | ICD-10-CM | POA: Diagnosis not present

## 2022-10-31 DIAGNOSIS — I2721 Secondary pulmonary arterial hypertension: Secondary | ICD-10-CM | POA: Diagnosis present

## 2022-10-31 DIAGNOSIS — R0602 Shortness of breath: Secondary | ICD-10-CM | POA: Diagnosis present

## 2022-10-31 DIAGNOSIS — J122 Parainfluenza virus pneumonia: Secondary | ICD-10-CM | POA: Diagnosis not present

## 2022-10-31 DIAGNOSIS — F411 Generalized anxiety disorder: Secondary | ICD-10-CM

## 2022-10-31 HISTORY — DX: Pneumonia, unspecified organism: J18.9

## 2022-10-31 LAB — COMPREHENSIVE METABOLIC PANEL
ALT: 9 U/L (ref 0–44)
ALT: 8 U/L (ref 0–44)
AST: 16 U/L (ref 15–41)
AST: 15 U/L (ref 15–41)
Albumin: 2.5 g/dL — ABNORMAL LOW (ref 3.5–5.0)
Albumin: 2.3 g/dL — ABNORMAL LOW (ref 3.5–5.0)
Alkaline Phosphatase: 45 U/L (ref 38–126)
Alkaline Phosphatase: 39 U/L (ref 38–126)
Anion gap: 11 (ref 5–15)
Anion gap: 12 (ref 5–15)
BUN: 58 mg/dL — ABNORMAL HIGH (ref 8–23)
BUN: 56 mg/dL — ABNORMAL HIGH (ref 8–23)
CO2: 20 mmol/L — ABNORMAL LOW (ref 22–32)
CO2: 19 mmol/L — ABNORMAL LOW (ref 22–32)
Calcium: 7.9 mg/dL — ABNORMAL LOW (ref 8.9–10.3)
Calcium: 7.7 mg/dL — ABNORMAL LOW (ref 8.9–10.3)
Chloride: 108 mmol/L (ref 98–111)
Chloride: 108 mmol/L (ref 98–111)
Creatinine, Ser: 3.02 mg/dL — ABNORMAL HIGH (ref 0.44–1.00)
Creatinine, Ser: 2.99 mg/dL — ABNORMAL HIGH (ref 0.44–1.00)
GFR, Estimated: 15 mL/min — ABNORMAL LOW (ref >60)
GFR, Estimated: 15 mL/min — ABNORMAL LOW (ref >60)
Glucose, Bld: 127 mg/dL — ABNORMAL HIGH (ref 70–99)
Glucose, Bld: 123 mg/dL — ABNORMAL HIGH (ref 70–99)
Potassium: 4.4 mmol/L (ref 3.5–5.1)
Potassium: 4.4 mmol/L (ref 3.5–5.1)
Sodium: 139 mmol/L (ref 135–145)
Sodium: 139 mmol/L (ref 135–145)
Total Bilirubin: 0.6 mg/dL (ref 0.3–1.2)
Total Bilirubin: 0.5 mg/dL (ref 0.3–1.2)
Total Protein: 5.5 g/dL — ABNORMAL LOW (ref 6.5–8.1)
Total Protein: 5 g/dL — ABNORMAL LOW (ref 6.5–8.1)

## 2022-10-31 LAB — CBC WITH DIFFERENTIAL/PLATELET
Abs Immature Granulocytes: 0.16 10*3/uL — ABNORMAL HIGH (ref 0.00–0.07)
Abs Immature Granulocytes: 0.18 10*3/uL — ABNORMAL HIGH (ref 0.00–0.07)
Basophils Absolute: 0 10*3/uL (ref 0.0–0.1)
Basophils Absolute: 0 10*3/uL (ref 0.0–0.1)
Basophils Relative: 0 %
Basophils Relative: 0 %
Eosinophils Absolute: 0.2 10*3/uL (ref 0.0–0.5)
Eosinophils Absolute: 0.1 10*3/uL (ref 0.0–0.5)
Eosinophils Relative: 1 %
Eosinophils Relative: 1 %
HCT: 30.5 % — ABNORMAL LOW (ref 36.0–46.0)
HCT: 26.8 % — ABNORMAL LOW (ref 36.0–46.0)
Hemoglobin: 9.2 g/dL — ABNORMAL LOW (ref 12.0–15.0)
Hemoglobin: 8.3 g/dL — ABNORMAL LOW (ref 12.0–15.0)
Immature Granulocytes: 1 %
Immature Granulocytes: 2 %
Lymphocytes Relative: 7 %
Lymphocytes Relative: 7 %
Lymphs Abs: 0.8 10*3/uL (ref 0.7–4.0)
Lymphs Abs: 0.8 10*3/uL (ref 0.7–4.0)
MCH: 29.9 pg (ref 26.0–34.0)
MCH: 29.2 pg (ref 26.0–34.0)
MCHC: 30.2 g/dL (ref 30.0–36.0)
MCHC: 31 g/dL (ref 30.0–36.0)
MCV: 99 fL (ref 80.0–100.0)
MCV: 94.4 fL (ref 80.0–100.0)
Monocytes Absolute: 0.7 10*3/uL (ref 0.1–1.0)
Monocytes Absolute: 0.6 10*3/uL (ref 0.1–1.0)
Monocytes Relative: 6 %
Monocytes Relative: 6 %
Neutro Abs: 10 10*3/uL — ABNORMAL HIGH (ref 1.7–7.7)
Neutro Abs: 9.7 10*3/uL — ABNORMAL HIGH (ref 1.7–7.7)
Neutrophils Relative %: 85 %
Neutrophils Relative %: 84 %
Platelets: 169 10*3/uL (ref 150–400)
Platelets: 172 10*3/uL (ref 150–400)
RBC: 3.08 MIL/uL — ABNORMAL LOW (ref 3.87–5.11)
RBC: 2.84 MIL/uL — ABNORMAL LOW (ref 3.87–5.11)
RDW: 15.2 % (ref 11.5–15.5)
RDW: 15.2 % (ref 11.5–15.5)
WBC: 11.9 10*3/uL — ABNORMAL HIGH (ref 4.0–10.5)
WBC: 11.4 10*3/uL — ABNORMAL HIGH (ref 4.0–10.5)
nRBC: 0 % (ref 0.0–0.2)
nRBC: 0 % (ref 0.0–0.2)

## 2022-10-31 LAB — URINALYSIS, ROUTINE W REFLEX MICROSCOPIC
Bilirubin Urine: NEGATIVE
Glucose, UA: NEGATIVE mg/dL
Ketones, ur: NEGATIVE mg/dL
Leukocytes,Ua: NEGATIVE
Nitrite: NEGATIVE
Protein, ur: 100 mg/dL — AB
Specific Gravity, Urine: 1.008 (ref 1.005–1.030)
pH: 5 (ref 5.0–8.0)

## 2022-10-31 LAB — ECHOCARDIOGRAM COMPLETE
AV Mean grad: 6 mmHg
AV Peak grad: 9.9 mmHg
Ao pk vel: 1.57 m/s
Area-P 1/2: 4.12 cm2
S' Lateral: 3.5 cm
Weight: 2512 oz

## 2022-10-31 LAB — LACTIC ACID, PLASMA
Lactic Acid, Venous: 0.6 mmol/L (ref 0.5–1.9)
Lactic Acid, Venous: 0.8 mmol/L (ref 0.5–1.9)

## 2022-10-31 LAB — BRAIN NATRIURETIC PEPTIDE: B Natriuretic Peptide: 400.3 pg/mL — ABNORMAL HIGH (ref 0.0–100.0)

## 2022-10-31 LAB — MAGNESIUM: Magnesium: 1.4 mg/dL — ABNORMAL LOW (ref 1.7–2.4)

## 2022-10-31 LAB — RESP PANEL BY RT-PCR (RSV, FLU A&B, COVID)  RVPGX2
Influenza A by PCR: NEGATIVE
Influenza B by PCR: NEGATIVE
Resp Syncytial Virus by PCR: NEGATIVE
SARS Coronavirus 2 by RT PCR: NEGATIVE

## 2022-10-31 LAB — PHOSPHORUS: Phosphorus: 3.8 mg/dL (ref 2.5–4.6)

## 2022-10-31 LAB — APTT: aPTT: 27 seconds (ref 24–36)

## 2022-10-31 LAB — PROCALCITONIN: Procalcitonin: 0.19 ng/mL

## 2022-10-31 LAB — PROTIME-INR
INR: 1 (ref 0.8–1.2)
Prothrombin Time: 13.7 seconds (ref 11.4–15.2)

## 2022-10-31 LAB — MRSA NEXT GEN BY PCR, NASAL: MRSA by PCR Next Gen: NOT DETECTED

## 2022-10-31 LAB — STREP PNEUMONIAE URINARY ANTIGEN: Strep Pneumo Urinary Antigen: NEGATIVE

## 2022-10-31 MED ORDER — ALBUTEROL SULFATE (2.5 MG/3ML) 0.083% IN NEBU: 2.5000 mg | INHALATION_SOLUTION | RESPIRATORY_TRACT | Status: DC | PRN

## 2022-10-31 MED ORDER — GUAIFENESIN ER 600 MG PO TB12
600.0000 mg | ORAL_TABLET | Freq: Two times a day (BID) | ORAL | Status: DC
  Administered 2022-10-31 – 2022-11-06 (×13): 600 mg via ORAL
  Filled 2022-10-31 (×13): qty 1

## 2022-10-31 MED ORDER — ACETAMINOPHEN 500 MG PO TABS
1000.0000 mg | ORAL_TABLET | Freq: Once | ORAL | Status: AC
  Administered 2022-10-31: 1000 mg via ORAL
  Filled 2022-10-31: qty 2

## 2022-10-31 MED ORDER — IPRATROPIUM-ALBUTEROL 0.5-2.5 (3) MG/3ML IN SOLN
3.0000 mL | Freq: Two times a day (BID) | RESPIRATORY_TRACT | Status: DC
  Administered 2022-10-31 – 2022-11-01 (×3): 3 mL via RESPIRATORY_TRACT
  Filled 2022-10-31 (×3): qty 3

## 2022-10-31 MED ORDER — CITALOPRAM HYDROBROMIDE 20 MG PO TABS
20.0000 mg | ORAL_TABLET | Freq: Every evening | ORAL | Status: DC
  Administered 2022-10-31 – 2022-11-05 (×6): 20 mg via ORAL
  Filled 2022-10-31 (×6): qty 1

## 2022-10-31 MED ORDER — ACETAMINOPHEN 650 MG RE SUPP: 650.0000 mg | Freq: Four times a day (QID) | RECTAL | Status: DC | PRN

## 2022-10-31 MED ORDER — CARVEDILOL 6.25 MG PO TABS
6.2500 mg | ORAL_TABLET | Freq: Every day | ORAL | Status: DC
  Administered 2022-10-31 – 2022-11-02 (×3): 6.25 mg via ORAL
  Filled 2022-10-31 (×3): qty 1

## 2022-10-31 MED ORDER — MELATONIN 3 MG PO TABS
3.0000 mg | ORAL_TABLET | Freq: Every evening | ORAL | Status: DC | PRN
  Administered 2022-11-05: 3 mg via ORAL
  Filled 2022-10-31: qty 1

## 2022-10-31 MED ORDER — ONDANSETRON HCL 4 MG/2ML IJ SOLN: 4.0000 mg | Freq: Four times a day (QID) | INTRAMUSCULAR | Status: DC | PRN

## 2022-10-31 MED ORDER — LORAZEPAM 0.5 MG PO TABS: 0.5000 mg | ORAL_TABLET | Freq: Two times a day (BID) | ORAL | Status: DC | PRN

## 2022-10-31 MED ORDER — VANCOMYCIN HCL 1750 MG/350ML IV SOLN
1750.0000 mg | Freq: Once | INTRAVENOUS | Status: AC
  Administered 2022-10-31: 1750 mg via INTRAVENOUS
  Filled 2022-10-31: qty 350

## 2022-10-31 MED ORDER — IPRATROPIUM-ALBUTEROL 0.5-2.5 (3) MG/3ML IN SOLN
3.0000 mL | Freq: Once | RESPIRATORY_TRACT | Status: AC
  Administered 2022-10-31: 3 mL via RESPIRATORY_TRACT
  Filled 2022-10-31: qty 3

## 2022-10-31 MED ORDER — DOXAZOSIN MESYLATE 4 MG PO TABS
6.0000 mg | ORAL_TABLET | Freq: Every day | ORAL | Status: DC
  Administered 2022-11-01 – 2022-11-04 (×4): 6 mg via ORAL
  Filled 2022-10-31 (×5): qty 1

## 2022-10-31 MED ORDER — HEPARIN SODIUM (PORCINE) 5000 UNIT/ML IJ SOLN
5000.0000 [IU] | Freq: Three times a day (TID) | INTRAMUSCULAR | Status: DC
  Administered 2022-10-31 – 2022-11-06 (×18): 5000 [IU] via SUBCUTANEOUS
  Filled 2022-10-31 (×18): qty 1

## 2022-10-31 MED ORDER — ACETAMINOPHEN 325 MG PO TABS
650.0000 mg | ORAL_TABLET | Freq: Four times a day (QID) | ORAL | Status: DC | PRN
  Administered 2022-11-02 – 2022-11-06 (×6): 650 mg via ORAL
  Filled 2022-10-31 (×6): qty 2

## 2022-10-31 MED ORDER — SODIUM CHLORIDE 0.9% FLUSH
3.0000 mL | Freq: Two times a day (BID) | INTRAVENOUS | Status: DC
  Administered 2022-10-31 – 2022-11-06 (×11): 3 mL via INTRAVENOUS

## 2022-10-31 MED ORDER — AMLODIPINE BESYLATE 5 MG PO TABS
5.0000 mg | ORAL_TABLET | Freq: Every day | ORAL | Status: DC
  Administered 2022-10-31 – 2022-11-04 (×5): 5 mg via ORAL
  Filled 2022-10-31 (×5): qty 1

## 2022-10-31 MED ORDER — HYDRALAZINE HCL 20 MG/ML IJ SOLN
10.0000 mg | INTRAMUSCULAR | Status: DC | PRN
  Administered 2022-11-04 – 2022-11-06 (×2): 10 mg via INTRAVENOUS
  Filled 2022-10-31 (×2): qty 1

## 2022-10-31 MED ORDER — MAGNESIUM SULFATE 2 GM/50ML IV SOLN
2.0000 g | Freq: Once | INTRAVENOUS | Status: AC
  Administered 2022-10-31: 2 g via INTRAVENOUS
  Filled 2022-10-31: qty 50

## 2022-10-31 MED ORDER — LACTATED RINGERS IV BOLUS (SEPSIS)
250.0000 mL | Freq: Once | INTRAVENOUS | Status: AC
  Administered 2022-10-31: 250 mL via INTRAVENOUS

## 2022-10-31 MED ORDER — CLOPIDOGREL BISULFATE 75 MG PO TABS
75.0000 mg | ORAL_TABLET | Freq: Every morning | ORAL | Status: DC
  Administered 2022-11-01 – 2022-11-06 (×6): 75 mg via ORAL
  Filled 2022-10-31 (×6): qty 1

## 2022-10-31 MED ORDER — PREDNISONE 20 MG PO TABS
40.0000 mg | ORAL_TABLET | Freq: Every day | ORAL | Status: DC
  Administered 2022-11-01 – 2022-11-04 (×4): 40 mg via ORAL
  Filled 2022-10-31 (×5): qty 2

## 2022-10-31 MED ORDER — BUDESONIDE 0.5 MG/2ML IN SUSP
0.5000 mg | Freq: Two times a day (BID) | RESPIRATORY_TRACT | Status: DC
  Administered 2022-10-31 – 2022-11-06 (×12): 0.5 mg via RESPIRATORY_TRACT
  Filled 2022-10-31 (×12): qty 2

## 2022-10-31 MED ORDER — SODIUM CHLORIDE 0.9 % IV SOLN
2.0000 g | Freq: Once | INTRAVENOUS | Status: AC
  Administered 2022-10-31: 2 g via INTRAVENOUS
  Filled 2022-10-31: qty 10

## 2022-10-31 MED ORDER — MONTELUKAST SODIUM 10 MG PO TABS
10.0000 mg | ORAL_TABLET | Freq: Every day | ORAL | Status: DC
  Administered 2022-10-31 – 2022-11-06 (×7): 10 mg via ORAL
  Filled 2022-10-31 (×7): qty 1

## 2022-10-31 MED ORDER — LACTATED RINGERS IV SOLN: INTRAVENOUS | Status: DC

## 2022-10-31 MED ORDER — SODIUM CHLORIDE 0.9 % IV SOLN
1.0000 g | Freq: Three times a day (TID) | INTRAVENOUS | Status: DC
  Administered 2022-10-31 – 2022-11-01 (×3): 1 g via INTRAVENOUS
  Filled 2022-10-31 (×7): qty 5

## 2022-10-31 MED ORDER — ALBUTEROL SULFATE HFA 108 (90 BASE) MCG/ACT IN AERS
2.0000 | INHALATION_SPRAY | RESPIRATORY_TRACT | Status: DC | PRN
  Administered 2022-10-31: 2 via RESPIRATORY_TRACT
  Filled 2022-10-31: qty 6.7

## 2022-10-31 MED ORDER — METRONIDAZOLE 500 MG/100ML IV SOLN
500.0000 mg | Freq: Once | INTRAVENOUS | Status: AC
  Administered 2022-10-31: 500 mg via INTRAVENOUS
  Filled 2022-10-31: qty 100

## 2022-10-31 MED ORDER — PANTOPRAZOLE SODIUM 40 MG PO TBEC
40.0000 mg | DELAYED_RELEASE_TABLET | Freq: Two times a day (BID) | ORAL | Status: DC
  Administered 2022-10-31 – 2022-11-06 (×12): 40 mg via ORAL
  Filled 2022-10-31 (×12): qty 1

## 2022-10-31 MED ORDER — HYDRALAZINE HCL 50 MG PO TABS
75.0000 mg | ORAL_TABLET | Freq: Three times a day (TID) | ORAL | Status: DC
  Administered 2022-10-31 – 2022-11-04 (×12): 75 mg via ORAL
  Filled 2022-10-31 (×12): qty 1

## 2022-10-31 MED ORDER — ARFORMOTEROL TARTRATE 15 MCG/2ML IN NEBU
15.0000 ug | INHALATION_SOLUTION | Freq: Two times a day (BID) | RESPIRATORY_TRACT | Status: DC
  Administered 2022-10-31 – 2022-11-06 (×12): 15 ug via RESPIRATORY_TRACT
  Filled 2022-10-31 (×12): qty 2

## 2022-10-31 MED ORDER — FUROSEMIDE 10 MG/ML IJ SOLN
40.0000 mg | Freq: Two times a day (BID) | INTRAMUSCULAR | Status: DC
  Administered 2022-10-31 – 2022-11-01 (×2): 40 mg via INTRAVENOUS
  Filled 2022-10-31 (×2): qty 4

## 2022-10-31 MED ORDER — VANCOMYCIN HCL IN DEXTROSE 1-5 GM/200ML-% IV SOLN: 1000.0000 mg | Freq: Once | INTRAVENOUS | Status: DC

## 2022-10-31 MED ORDER — ATORVASTATIN CALCIUM 40 MG PO TABS
40.0000 mg | ORAL_TABLET | Freq: Every day | ORAL | Status: DC
  Administered 2022-10-31 – 2022-11-06 (×7): 40 mg via ORAL
  Filled 2022-10-31 (×7): qty 1

## 2022-10-31 NOTE — ED Provider Notes (Signed)
[facility]-EMERGENCY DEPT [facility] Emergency Department Provider Note MRN:  [id number]  Arrival date & time: 10/31/22     Chief Complaint   Shortness of Breath   History of Present Illness   [name] is a 89 y.o. year-old female presents to the ED with chief complaint of SOB and cough x 1 week.  Was seen at [facility] on 6/8 and rx'd Azithro and prednisone after suspected asthma exacerbation.  States that she has had persistent and worsening symptoms.  Hx of kidney disease and keeps swelling on her legs.  Doesn't wear home O2.  No measured fever at home, but 100.1 in triage.  BIB family.  History provided by patient.   Review of Systems  Pertinent positive and negative review of systems noted in HPI.    Physical Exam   Vitals:   10/31/22 0245 10/31/22 0300  BP: (!) 162/67 (!) 173/60  Pulse: 89 88  Resp: (!) 25 19  Temp:  99.7 F (37.6 C)  SpO2: 96% 94%    CONSTITUTIONAL:  unwell-appearing, NAD NEURO:  Alert and oriented x 3, CN 3-12 grossly intact EYES:  eyes equal and reactive ENT/NECK:  Supple, no stridor  CARDIO:  tachycardic, regular rhythm, appears well-perfused  PULM:  No respiratory distress, but very mild increase WOB with coarse lung sounds GI/GU:  non-distended,  MSK/SPINE:  No gross deformities, no edema, moves all extremities  SKIN:  no rash, atraumatic   *Additional and/or pertinent findings included in MDM below  Diagnostic and Interventional Summary    EKG Interpretation  Date/Time:    Ventricular Rate:    PR Interval:    QRS Duration:   QT Interval:    QTC Calculation:   R Axis:     Text Interpretation:         Labs Reviewed  CBC WITH DIFFERENTIAL/PLATELET - Abnormal; Notable for the following components:      Result Value   WBC 11.9 (*)    RBC 3.08 (*)    Hemoglobin 9.2 (*)    HCT 30.5 (*)    Neutro Abs 10.0 (*)    Abs Immature Granulocytes 0.16 (*)    All other components within normal limits  COMPREHENSIVE METABOLIC PANEL -  Abnormal; Notable for the following components:   CO2 20 (*)    Glucose, Bld 127 (*)    BUN 58 (*)    Creatinine, Ser 3.02 (*)    Calcium 7.9 (*)    Total Protein 5.5 (*)    Albumin 2.5 (*)    GFR, Estimated 15 (*)    All other components within normal limits  URINALYSIS, ROUTINE W REFLEX MICROSCOPIC - Abnormal; Notable for the following components:   Color, Urine STRAW (*)    Hgb urine dipstick SMALL (*)    Protein, ur 100 (*)    Bacteria, UA RARE (*)    All other components within normal limits  BRAIN NATRIURETIC PEPTIDE - Abnormal; Notable for the following components:   B Natriuretic Peptide 400.3 (*)    All other components within normal limits  RESP PANEL BY RT-PCR (RSV, FLU A&B, COVID)  RVPGX2  CULTURE, BLOOD (SINGLE)  MRSA NEXT GEN BY PCR, NASAL  LACTIC ACID, PLASMA  PROTIME-INR  APTT  LACTIC ACID, PLASMA  CBC WITH DIFFERENTIAL/PLATELET  COMPREHENSIVE METABOLIC PANEL  MAGNESIUM  PHOSPHORUS  PROCALCITONIN    DG Chest 2 View  Final Result      Medications  albuterol (VENTOLIN HFA) 108 (90 Base) MCG/ACT inhaler  2 puff (2 puffs Inhalation Given 10/31/22 0059)  lactated ringers infusion ( Intravenous New Bag/Given 10/31/22 0202)  vancomycin (VANCOREADY) IVPB 1750 mg/350 mL (has no administration in time range)  aztreonam (AZACTAM) 1 g in sodium chloride 0.9 % 100 mL IVPB (has no administration in time range)  acetaminophen (TYLENOL) tablet 650 mg (has no administration in time range)    Or  acetaminophen (TYLENOL) suppository 650 mg (has no administration in time range)  melatonin tablet 3 mg (has no administration in time range)  ondansetron (ZOFRAN) injection 4 mg (has no administration in time range)  lactated ringers bolus 250 mL (0 mLs Intravenous Stopped 10/31/22 0315)  aztreonam (AZACTAM) 2 g in sodium chloride 0.9 % 100 mL IVPB (0 g Intravenous Stopped 10/31/22 0249)  metroNIDAZOLE (FLAGYL) IVPB 500 mg (500 mg Intravenous New Bag/Given 10/31/22 0251)   acetaminophen (TYLENOL) tablet 1,000 mg (1,000 mg Oral Given 10/31/22 0205)  ipratropium-albuterol (DUONEB) 0.5-2.5 (3) MG/3ML nebulizer solution 3 mL (3 mLs Nebulization Given 10/31/22 0207)     Procedures  /  Critical Care .Critical Care  Performed by: Roxy Horseman, PA-C Authorized by: Roxy Horseman, PA-C   Critical care provider statement:    Critical care time (minutes):  37   Critical care was necessary to treat or prevent imminent or life-threatening deterioration of the following conditions:  Sepsis   Critical care was time spent personally by me on the following activities:  Development of treatment plan with patient or surrogate, discussions with consultants, evaluation of patient's response to treatment, examination of patient, ordering and review of laboratory studies, ordering and review of radiographic studies, ordering and performing treatments and interventions, pulse oximetry, re-evaluation of patient's condition and review of old charts   ED Course and Medical Decision Making  I have reviewed the triage vital signs, the nursing notes, and pertinent available records from the EMR.  Social Determinants Affecting Complexity of Care: Patient has no clinically significant social determinants affecting this chief complaint..   ED Course:    Medical Decision Making Patient here with cough and fever.  CXR notable for multifocal pneumonia.  Labs are pending.  Will need admission.  Amount and/or Complexity of Data Reviewed Labs: ordered.    Details: Lactic is 0.6, will hold off on weight based fluids in setting of CKD and peripheral edema Leukocytosis to 11.9, likely 2/2 to pneumonia Cr 3.08, about baseline Radiology: ordered and independent interpretation performed.    Details: Right sided opacities, will treat for pneumonia.  Risk OTC drugs. Prescription drug management. Decision regarding hospitalization.     Consultants: I consulted with Hospitalist, Dr.  Arlean Hopping, who is appreciated for admitting.   Treatment and Plan: Patient's exam and diagnostic results are concerning for pneumonia failing outpatient therapy.  Feel that patient will need admission to the hospital for further treatment and evaluation.    Final Clinical Impressions(s) / ED Diagnoses     ICD-10-CM   1. Community acquired pneumonia of right lung, unspecified part of lung  J18.9       ED Discharge Orders     None         Discharge Instructions Discussed with and Provided to Patient:   Discharge Instructions   None      Roxy Horseman, PA-C 10/31/22 0353    Mesner, Barbara Cower, MD 10/31/22 [phone]

## 2022-10-31 NOTE — ED Notes (Addendum)
ED TO INPATIENT HANDOFF REPORT  ED Nurse Name and Phone #:  Marcello Moores [phone]  S Name/Age/Gender [name] 89 y.o. female Room/Bed: 027C/027C  Code Status   Code Status: Full Code  Home/SNF/Other Home Patient oriented to: self, place, time, and situation Is this baseline? Yes   Triage Complete: Triage complete  Chief Complaint CAP (community acquired pneumonia) [J18.9]  Triage Note Pt arrived from home via SOB.  Pt recently treated with abx and steroids for resp infection. Pt comes in today sob, resp labored. Wheezing bilaterally crackles noted in LLL.    Allergies Allergies  Allergen Reactions   Penicillins Nausea And Vomiting and Other (See Comments)    Has patient had a PCN reaction causing immediate rash, facial/tongue/throat swelling, SOB or lightheadedness with hypotension: Y Has patient had a PCN reaction causing severe rash involving mucus membranes or skin necrosis: Y Has patient had a PCN reaction that required hospitalization: N Has patient had a PCN reaction occurring within the last 10 years: N If all of the above answers are "NO", then may proceed with Cephalosporin use.    Aspirin Other (See Comments)    Wheezing Acetaminophen is OK    Level of Care/Admitting Diagnosis ED Disposition     ED Disposition  Admit   Condition  --   Comment  Hospital Area: [facility] [[id number]]  Level of Care: Telemetry Medical [[id number]]  May admit patient to [facility] or [facility] if equivalent level of care is available:: No  Covid Evaluation: Asymptomatic - no recent exposure (last 10 days) testing not required  Diagnosis: CAP (community acquired pneumonia) [454098]  Admitting Physician: Angie Fava [[id number]]  Attending Physician: Angie Fava [[id number]]  Certification:: I certify this patient will need inpatient services for at least 2 midnights  Estimated Length of Stay: 2          B Medical/Surgery History Past Medical  History:  Diagnosis Date   Acute superficial venous thrombosis of left lower extremity 03/27/2014   concern about hx and potential extensive on exam tenderness calf and low dose lovenox 40 qd 2-4 weekselevetion and close  fu advised .    Allergy    Anemia    Anxiety    Arthritis    "shoulders" (09/09/2012)   Asthma 09/08/2012   Carotid bruit    [place] 2018  low risk 1 - 39%   Cataract    bil cateracts removed   Chronic kidney disease    Clotting disorder (HCC)    blood clots in legs   Diabetes mellitus without complication (HCC)    "Borderline" per pt; being monitored.  no meds per pt   Diverticulosis    Dyspnea 04/27/2014   GERD (gastroesophageal reflux disease)    Headache(784.0)    "related to my high blood pressure" (09/09/2012)   Hiatal hernia    History of DVT (deep vein thrombosis)    "RLE" (09/09/2012) coumadine cant take asa so on plavix    HTN (hypertension) 09/08/2012   Hyperlipidemia    Lupus (HCC)    "cured years ago" (09/09/2012)   Other dysphagia 02/11/2013   Syncope and collapse 01/21/2018   Varicose veins    Varicose veins of leg with complications 12/05/2010   Past Surgical History:  Procedure Laterality Date   ABDOMINAL HYSTERECTOMY     partial   BREAST EXCISIONAL BIOPSY Right    BREAST SURGERY     CARPAL TUNNEL RELEASE Right 2000's   CARPAL TUNNEL  RELEASE Bilateral 10/21/2013   Procedure: BILATERAL  CARPAL TUNNEL RELEASE;  Surgeon: Nicki Reaper, MD;  Location: [facility];  Service: Orthopedics;  Laterality: Bilateral;   COLONOSCOPY     SHOULDER OPEN ROTATOR CUFF REPAIR Bilateral 2000's   TRIGGER FINGER RELEASE Right 10/21/2013   Procedure: RELEASE TRIGGER FINGER/A-1 PULLEY RIGHT MIDDLE AND RIGHT RING;  Surgeon: Nicki Reaper, MD;  Location: [facility];  Service: Orthopedics;  Laterality: Right;   UPPER GASTROINTESTINAL ENDOSCOPY       A IV Location/Drains/Wounds Patient Lines/Drains/Airways Status     Active  Line/Drains/Airways     Name Placement date Placement time Site Days   Peripheral IV 10/31/22 20 G Right Antecubital 10/31/22  0137  Antecubital  less than 1   Peripheral IV 10/31/22 20 G Left Antecubital 10/31/22  0155  Antecubital  less than 1            Intake/Output Last 24 hours  Intake/Output Summary (Last 24 hours) at 10/31/2022 0409 Last data filed at 10/31/2022 0354 Gross per 24 hour  Intake 447.5 ml  Output --  Net 447.5 ml    Labs/Imaging Results for orders placed or performed during the hospital encounter of 10/30/22 (from the past 48 hour(s))  CBC with Differential     Status: Abnormal   Collection Time: 10/31/22  1:49 AM  Result Value Ref Range   WBC 11.9 (H) 4.0 - 10.5 K/uL   RBC 3.08 (L) 3.87 - 5.11 MIL/uL   Hemoglobin 9.2 (L) 12.0 - 15.0 g/dL   HCT 69.6 (L) 29.5 - 28.4 %   MCV 99.0 80.0 - 100.0 fL   MCH 29.9 26.0 - 34.0 pg   MCHC 30.2 30.0 - 36.0 g/dL   RDW 13.2 44.0 - 10.2 %   Platelets 169 150 - 400 K/uL   nRBC 0.0 0.0 - 0.2 %   Neutrophils Relative % 85 %   Neutro Abs 10.0 (H) 1.7 - 7.7 K/uL   Lymphocytes Relative 7 %   Lymphs Abs 0.8 0.7 - 4.0 K/uL   Monocytes Relative 6 %   Monocytes Absolute 0.7 0.1 - 1.0 K/uL   Eosinophils Relative 1 %   Eosinophils Absolute 0.2 0.0 - 0.5 K/uL   Basophils Relative 0 %   Basophils Absolute 0.0 0.0 - 0.1 K/uL   Immature Granulocytes 1 %   Abs Immature Granulocytes 0.16 (H) 0.00 - 0.07 K/uL    Comment: Performed at [facility], [address], [place], [place] [address]  Comprehensive metabolic panel     Status: Abnormal   Collection Time: 10/31/22  1:49 AM  Result Value Ref Range   Sodium 139 135 - 145 mmol/L   Potassium 4.4 3.5 - 5.1 mmol/L   Chloride 108 98 - 111 mmol/L   CO2 20 (L) 22 - 32 mmol/L   Glucose, Bld 127 (H) 70 - 99 mg/dL    Comment: Glucose reference range applies only to samples taken after fasting for at least 8 hours.   BUN 58 (H) 8 - 23 mg/dL   Creatinine, Ser 6.44 (H) 0.44 -  1.00 mg/dL   Calcium 7.9 (L) 8.9 - 10.3 mg/dL   Total Protein 5.5 (L) 6.5 - 8.1 g/dL   Albumin 2.5 (L) 3.5 - 5.0 g/dL   AST 16 15 - 41 U/L   ALT 9 0 - 44 U/L   Alkaline Phosphatase 45 38 - 126 U/L   Total Bilirubin 0.6 0.3 -  1.2 mg/dL   GFR, Estimated 15 (L) >60 mL/min    Comment: (NOTE) Calculated using the CKD-EPI Creatinine Equation (2021)    Anion gap 11 5 - 15    Comment: Performed at [facility], [address], [place], [place] [address]  Lactic acid, plasma     Status: None   Collection Time: 10/31/22  1:49 AM  Result Value Ref Range   Lactic Acid, Venous 0.6 0.5 - 1.9 mmol/L    Comment: Performed at [facility], [address], [place], [place] [address]  Protime-INR     Status: None   Collection Time: 10/31/22  1:49 AM  Result Value Ref Range   Prothrombin Time 13.7 11.4 - 15.2 seconds   INR 1.0 0.8 - 1.2    Comment: (NOTE) INR goal varies based on device and disease states. Performed at [facility], [address] [place], [place] [address]   APTT     Status: None   Collection Time: 10/31/22  1:49 AM  Result Value Ref Range   aPTT 27 24 - 36 seconds    Comment: Performed at [facility], [address], [place], [place] [address]  Brain natriuretic peptide     Status: Abnormal   Collection Time: 10/31/22  1:49 AM  Result Value Ref Range   B Natriuretic Peptide 400.3 (H) 0.0 - 100.0 pg/mL    Comment: Performed at [facility], [address], [place], [place] [address]  Urinalysis, Routine w reflex microscopic -Urine, Clean Catch     Status: Abnormal   Collection Time: 10/31/22  2:09 AM  Result Value Ref Range   Color, Urine STRAW (A) YELLOW   APPearance CLEAR CLEAR   Specific Gravity, Urine 1.008 1.005 - 1.030   pH 5.0 5.0 - 8.0   Glucose, UA NEGATIVE NEGATIVE mg/dL   Hgb urine dipstick SMALL (A) NEGATIVE   Bilirubin Urine NEGATIVE NEGATIVE   Ketones, ur NEGATIVE NEGATIVE mg/dL   Protein, ur 102 (A) NEGATIVE mg/dL    Nitrite NEGATIVE NEGATIVE   Leukocytes,Ua NEGATIVE NEGATIVE   RBC / HPF 0-5 0 - 5 RBC/hpf   WBC, UA 0-5 0 - 5 WBC/hpf   Bacteria, UA RARE (A) NONE SEEN   Squamous Epithelial / HPF 0-5 0 - 5 /HPF    Comment: Performed at [facility], [address], [place], [place] [address]  Resp panel by RT-PCR (RSV, Flu A&B, Covid) Urine, Clean Catch     Status: None   Collection Time: 10/31/22  2:09 AM   Specimen: Urine, Clean Catch; Nasal Swab  Result Value Ref Range   SARS Coronavirus 2 by RT PCR NEGATIVE NEGATIVE   Influenza A by PCR NEGATIVE NEGATIVE   Influenza B by PCR NEGATIVE NEGATIVE    Comment: (NOTE) The Xpert Xpress SARS-CoV-2/FLU/RSV plus assay is intended as an aid in the diagnosis of influenza from Nasopharyngeal swab specimens and should not be used as a sole basis for treatment. Nasal washings and aspirates are unacceptable for Xpert Xpress SARS-CoV-2/FLU/RSV testing.  Fact Sheet for Patients: BloggerCourse.com  Fact Sheet for Healthcare Providers: SeriousBroker.it  This test is not yet approved or cleared by the [place] FDA and has been authorized for detection and/or diagnosis of SARS-CoV-2 by FDA under an Emergency Use Authorization (EUA). This EUA will remain in effect (meaning this test can be used) for the duration of the COVID-19 declaration under Section 564(b)(1) of the Act,  21 U.S.C. section 360bbb-3(b)(1), unless the authorization is terminated or revoked.     Resp Syncytial Virus by PCR NEGATIVE NEGATIVE    Comment: (NOTE) Fact Sheet for Patients: BloggerCourse.com  Fact Sheet for Healthcare Providers: SeriousBroker.it  This test is not yet approved or cleared by the [place] FDA and has been authorized for detection and/or diagnosis of SARS-CoV-2 by FDA under an Emergency Use Authorization (EUA). This EUA will remain in effect (meaning  this test can be used) for the duration of the COVID-19 declaration under Section 564(b)(1) of the Act, 21 U.S.C. section 360bbb-3(b)(1), unless the authorization is terminated or revoked.  Performed at [facility], [address] [place], [place] [address]    *Note: Due to a large number of results and/or encounters for the requested time period, some results have not been displayed. A complete set of results can be found in Results Review.   DG Chest 2 View  Result Date: 10/31/2022 CLINICAL DATA:  Shortness of breath EXAM: CHEST - 2 VIEW COMPARISON:  08/03/2022 FINDINGS: Stable enlargement of the cardiomediastinal silhouette. Aortic atherosclerotic calcification. Chronic bronchitic changes and hyperinflation. Patchy airspace opacities in the right mid lung and right lower lung suspicious for multifocal pneumonia. No pleural effusion or pneumothorax. IMPRESSION: Patchy opacities in the right lung suspicious for multifocal pneumonia. Follow-up in 6-8 weeks is recommended to ensure resolution. Electronically Signed   By: Minerva Fester M.D.   On: 10/31/2022 01:26    Pending Labs Unresulted Labs (From admission, onward)     Start     Ordered   10/31/22 0500  CBC with Differential/Platelet  Tomorrow morning,   R        10/31/22 0351   10/31/22 0500  Comprehensive metabolic panel  Tomorrow morning,   R        10/31/22 0351   10/31/22 0500  Magnesium  Tomorrow morning,   R        10/31/22 0351   10/31/22 0500  Phosphorus  Tomorrow morning,   R        10/31/22 0351   10/31/22 0352  Procalcitonin  Add-on,   AD       References:    Procalcitonin Lower Respiratory Tract Infection AND Sepsis Procalcitonin Algorithm   10/31/22 0351   10/31/22 0249  MRSA Next Gen by PCR, Nasal  (MRSA Screening)  Once,   URGENT        10/31/22 0248   10/31/22 0139  Lactic acid, plasma  (Undifferentiated presentation (screening labs and basic nursing orders))  Now then every 2 hours,   R (with STAT  occurrences)      10/31/22 0139   10/31/22 0138  Culture, blood (single)  (Undifferentiated -> Now sepsis confirmed (treatment and sepsis specific nursing orders))  ONCE - STAT,   STAT        10/31/22 0138            Vitals/Pain Today's Vitals   10/31/22 0137 10/31/22 0245 10/31/22 0251 10/31/22 0300  BP: (!) 189/74 (!) 162/67  (!) 173/60  Pulse: (!) 103 89  88  Resp: (!) 24 (!) 25  19  Temp:    99.7 F (37.6 C)  TempSrc:    Oral  SpO2: 97% 96%  94%  Weight: 71.2 kg     PainSc:   Asleep     Isolation Precautions No active isolations  Medications Medications  albuterol (VENTOLIN HFA) 108 (90 Base) MCG/ACT inhaler 2 puff (2 puffs  Inhalation Given 10/31/22 0059)  lactated ringers infusion ( Intravenous Rate/Dose Change 10/31/22 0355)  vancomycin (VANCOREADY) IVPB 1750 mg/350 mL (has no administration in time range)  aztreonam (AZACTAM) 1 g in sodium chloride 0.9 % 100 mL IVPB (has no administration in time range)  acetaminophen (TYLENOL) tablet 650 mg (has no administration in time range)    Or  acetaminophen (TYLENOL) suppository 650 mg (has no administration in time range)  melatonin tablet 3 mg (has no administration in time range)  ondansetron (ZOFRAN) injection 4 mg (has no administration in time range)  lactated ringers bolus 250 mL (0 mLs Intravenous Stopped 10/31/22 0315)  aztreonam (AZACTAM) 2 g in sodium chloride 0.9 % 100 mL IVPB (0 g Intravenous Stopped 10/31/22 0249)  metroNIDAZOLE (FLAGYL) IVPB 500 mg (0 mg Intravenous Stopped 10/31/22 0354)  acetaminophen (TYLENOL) tablet 1,000 mg (1,000 mg Oral Given 10/31/22 0205)  ipratropium-albuterol (DUONEB) 0.5-2.5 (3) MG/3ML nebulizer solution 3 mL (3 mLs Nebulization Given 10/31/22 0207)    Mobility walks with device     Focused Assessments     R Recommendations: See Admitting Provider Note  Report given to:   Additional Notes:  Can stand to use [facility] w/ assist. Walks with cane. Was told by pharmacy to hold  vancomycin until MRSA result come back.

## 2022-10-31 NOTE — ED Triage Notes (Signed)
Pt arrived from home via SOB.  Pt recently treated with abx and steroids for resp infection. Pt comes in today sob, resp labored. Wheezing bilaterally crackles noted in LLL.

## 2022-10-31 NOTE — H&P (Signed)
History and Physical    Patient: [name] ZOX:[id number] DOB: November 06, 1933 DOA: 10/30/2022 DOS: the patient was seen and examined on 10/31/2022 PCP: [name], MD  Patient coming from: Home  Chief Complaint:  Chief Complaint  Patient presents with   Shortness of Breath   HPI: [name] is a 89 y.o. female with medical history significant of hypertension, hyperlipidemia, asthma, CKD stage IV, DM type II, DVT, CKD, history of lupus, GERD who presents with complaints of shortness of breath over the last 2 weeks.  She reports having a productive cough with increased wheezing.  She had been seen in urgent care on 6/8 and diagnosed with moderate persistent asthma exacerbation to be given Solu-Medrol 80 mg IM and sent home with a steroid taper and azithromycin. She reported intermittently feeling better and then worse at times.  She denies any recent sick contacts, fever, sore throat, chest pain, nausea, vomiting, or diarrhea symptoms.  She denies getting choked up with eating.  She has had increased lower extremity swelling and reports that she normally sleeps propped up.  Denies getting choked up with eating.  She is followed in the outpatient setting by Dr. [name] of nephrology and is not on dialysis as her kidney function has been stable.   Upon admission to the emergency department patient was noted to have temperature of 100.1 F with heart rate 81-1 03, respirations 15-25, blood pressure elevated to 201/70,, and O2 saturations currently maintained on room air.  Labs significant for WBC 11.9, hemoglobin 9.2->8.3, BUN 58, creatinine 3.02, calcium 7.9, BNP 400.3, lactic acid 0.6, and procalcitonin 0.19.  Chest x-ray was noted patchy opacities in the right lung suspicious for multifocal pneumonia.  Influenza, COVID-19, and RSV screening were negative.  Blood cultures have been obtained.  Patient had been given acetaminophen 1000 mg p.o., DuoNeb breathing treatment, 250 mL of lactated  Ringer's, aztreonam, metronidazole, and vancomycin.  Review of Systems: As mentioned in the history of present illness. All other systems reviewed and are negative. Past Medical History:  Diagnosis Date   Acute superficial venous thrombosis of left lower extremity 03/27/2014   concern about hx and potential extensive on exam tenderness calf and low dose lovenox 40 qd 2-4 weekselevetion and close  fu advised .    Allergy    Anemia    Anxiety    Arthritis    "shoulders" (09/09/2012)   Asthma 09/08/2012   Carotid bruit    [place] 2018  low risk 1 - 39%   Cataract    bil cateracts removed   Chronic kidney disease    Clotting disorder (HCC)    blood clots in legs   Diabetes mellitus without complication (HCC)    "Borderline" per pt; being monitored.  no meds per pt   Diverticulosis    Dyspnea 04/27/2014   GERD (gastroesophageal reflux disease)    Headache(784.0)    "related to my high blood pressure" (09/09/2012)   Hiatal hernia    History of DVT (deep vein thrombosis)    "RLE" (09/09/2012) coumadine cant take asa so on plavix    HTN (hypertension) 09/08/2012   Hyperlipidemia    Lupus (HCC)    "cured years ago" (09/09/2012)   Other dysphagia 02/11/2013   Syncope and collapse 01/21/2018   Varicose veins    Varicose veins of leg with complications 12/05/2010   Past Surgical History:  Procedure Laterality Date   ABDOMINAL HYSTERECTOMY     partial   BREAST EXCISIONAL BIOPSY  Right    BREAST SURGERY     CARPAL TUNNEL RELEASE Right 2000's   CARPAL TUNNEL RELEASE Bilateral 10/21/2013   Procedure: BILATERAL  CARPAL TUNNEL RELEASE;  Surgeon: Nicki Reaper, MD;  Location: [facility];  Service: Orthopedics;  Laterality: Bilateral;   COLONOSCOPY     SHOULDER OPEN ROTATOR CUFF REPAIR Bilateral 2000's   TRIGGER FINGER RELEASE Right 10/21/2013   Procedure: RELEASE TRIGGER FINGER/A-1 PULLEY RIGHT MIDDLE AND RIGHT RING;  Surgeon: Nicki Reaper, MD;  Location: [facility];  Service: Orthopedics;  Laterality: Right;   UPPER GASTROINTESTINAL ENDOSCOPY     Social History:  reports that she has never smoked. She has never used smokeless tobacco. She reports that she does not drink alcohol and does not use drugs.  Allergies  Allergen Reactions   Penicillins Nausea And Vomiting and Other (See Comments)    Has patient had a PCN reaction causing immediate rash, facial/tongue/throat swelling, SOB or lightheadedness with hypotension: Y Has patient had a PCN reaction causing severe rash involving mucus membranes or skin necrosis: Y Has patient had a PCN reaction that required hospitalization: N Has patient had a PCN reaction occurring within the last 10 years: N If all of the above answers are "NO", then may proceed with Cephalosporin use.    Aspirin Other (See Comments)    Wheezing Acetaminophen is OK    Family History  Problem Relation Age of Onset   Hypertension Mother    Lung cancer Mother    Hypertension Father    Lung cancer Father    Breast cancer Sister    Colon cancer Sister        ? 97' s dx - died in 68's   Breast cancer Sister    Hypertension Brother    Rectal cancer Brother    Stomach cancer Brother    Breast cancer Brother    Heart attack Daughter    Esophageal cancer Daughter    Hypertension Daughter    Diabetes Daughter    Breast cancer Paternal Aunt    Lung cancer Other        both parents    Pancreatic cancer Neg Hx     Prior to Admission medications   Medication Sig Start Date End Date Taking? Authorizing Provider  acetaminophen (TYLENOL) 500 MG tablet Take 500 mg by mouth in the morning and at bedtime.    [provider]  albuterol (PROVENTIL) (2.5 MG/3ML) 0.083% nebulizer solution Take 3 mLs (2.5 mg total) by nebulization every 6 (six) hours as needed for shortness of breath or wheezing. 08/03/22   Carlisle Beers, FNP  albuterol (VENTOLIN HFA) 108 (90 Base) MCG/ACT inhaler USE 2 PUFFS EVERY 6 HOURS AS  NEEDED FOR WHEEZING. 08/07/22   Leslye Peer, MD  amLODipine (NORVASC) 5 MG tablet Take 1 tablet (5 mg total) by mouth daily. 03/07/22   Nahser, Deloris Ping, MD  atorvastatin (LIPITOR) 40 MG tablet Take 1 tablet (40 mg total) by mouth daily. 04/21/22   Nahser, Deloris Ping, MD  azaTHIOprine (IMURAN) 50 MG tablet Take 25 mg by mouth at bedtime. 04/09/21   [provider]  Azelastine-Fluticasone 137-50 MCG/ACT SUSP PLACE ONE SPRAY IN EACH NOSTRIL TWICE DAILY (IN THE MORNING AND AT BEDTIME) 01/19/22   Byrum, Les Pou, MD  azithromycin (ZITHROMAX) 250 MG tablet Take 1 tablet (250 mg total) by mouth daily. Take first 2 tablets together, then 1 every day until finished. 10/21/22   White,  Elita Boone, NP  Budeson-Glycopyrrol-Formoterol (BREZTRI AEROSPHERE) 160-9-4.8 MCG/ACT AERO INHALE 2 PUFFS INTO THE LUNGS TWICE DAILY, IN THE MORNING & AT BEDTIME. 06/22/22   Byrum, Les Pou, MD  carvedilol (COREG) 6.25 MG tablet Take 6.25 mg by mouth daily.    [provider]  cetirizine (ZYRTEC) 10 MG tablet Take 10 mg by mouth daily.    [provider]  citalopram (CELEXA) 20 MG tablet Take 1 tablet (20 mg total) by mouth every evening. 09/12/22   Panosh, Neta Mends, MD  cloNIDine (CATAPRES) 0.1 MG tablet Take 1 tablet every 12 hours as needed for blood pressures greater than 180 systolic Patient taking differently: Take 0.1 mg by mouth daily. 09/10/21   Geoffery Lyons, MD  clopidogrel (PLAVIX) 75 MG tablet TAKE ONE TABLET BY MOUTH ONCE DAILY. 10/19/22   Panosh, Neta Mends, MD  diclofenac Sodium (VOLTAREN) 1 % GEL Apply 4 g topically 2 (two) times daily as needed (painful areas). 05/02/22   Ellsworth Lennox, PA-C  doxazosin (CARDURA) 2 MG tablet TAKE THREE TABLETS BY MOUTH AT BEDTIME 08/22/22   Panosh, Neta Mends, MD  ferrous sulfate 325 (65 FE) MG EC tablet Take 1 tablet (325 mg total) by mouth daily with breakfast. 12/15/14   Panosh, Neta Mends, MD  fluticasone (FLONASE) 50 MCG/ACT nasal spray Place 2 sprays into both nostrils  daily. 10/20/19   Leslye Peer, MD  furosemide (LASIX) 40 MG tablet Take 40-80 mg by mouth See admin instructions. 80 mg in the am and 40 mg in the pm    [provider]  hydrALAZINE (APRESOLINE) 50 MG tablet Take 1&1/2 tablets (75 mg total) by mouth 3 (three) times daily. 10/24/22   Nahser, Deloris Ping, MD  LORazepam (ATIVAN) 0.5 MG tablet Take 1 tablet (0.5 mg total) by mouth 2 (two) times daily as needed. for anxiety 09/17/22   Panosh, Neta Mends, MD  montelukast (SINGULAIR) 10 MG tablet TAKE ONE TABLET BY MOUTH ONCE DAILY 08/06/22   Worthy Rancher B, FNP  ondansetron (ZOFRAN-ODT) 8 MG disintegrating tablet 8mg  ODT q4 hours prn nausea 09/12/21   Dione Booze, MD  pantoprazole (PROTONIX) 40 MG tablet TAKE ONE TABLET BY MOUTH TWICE DAILY 08/22/22   Panosh, Neta Mends, MD  potassium chloride (KLOR-CON) 10 MEQ tablet TAKE FOUR TABLETS BY MOUTH DAILY. 10/19/22   Panosh, Neta Mends, MD  RESTASIS 0.05 % ophthalmic emulsion 1 drop 2 (two) times daily. 10/13/21   [provider]  Spacer/Aero-Holding Chambers (AEROCHAMBER PLUS) inhaler Use as instructed 05/26/16   Domenick Gong, MD  vitamin B-12 (CYANOCOBALAMIN) 1000 MCG tablet Take 1,000 mcg by mouth daily.    [provider]  Vitamin D, Cholecalciferol, 25 MCG (1000 UT) TABS Take 1,000 Units by mouth daily.    [provider]    Physical Exam: Vitals:   10/31/22 0245 10/31/22 0300 10/31/22 0430 10/31/22 0522  BP: (!) 162/67 (!) 173/60 (!) 166/59 (!) 165/68  Pulse: 89 88 85 81  Resp: (!) 25 19 19 15   Temp:  99.7 F (37.6 C)  99.1 F (37.3 C)  TempSrc:  Oral  Oral  SpO2: 96% 94% 97% 100%  Weight:        Constitutional: Elderly female currently in no acute distress. Eyes: PERRL, lids and conjunctivae normal ENMT: Mucous membranes are moist. Posterior pharynx clear of any exudate or lesions. Neck: normal, supple, JVD present. Respiratory: Mildly tachypneic with rhonchi appreciated in the right lung field expiratory wheezes.   Patient currently maintaining O2  saturations on room air. Cardiovascular: Regular rate and rhythm, no murmurs / rubs / gallops.  At least +2 pitting bilateral lower extremity edema. Abdomen: no tenderness, no masses palpated. No hepatosplenomegaly. Bowel sounds positive.  Musculoskeletal: no clubbing / cyanosis. No joint deformity upper and lower extremities. Good ROM, no contractures. Normal muscle tone.  Skin: no rashes, lesions, ulcers. No induration Neurologic: CN 2-12 grossly intact.  Strength 5/5 in all 4.  Psychiatric: Normal judgment and insight. Alert and oriented x 3. Normal mood.   Data Reviewed:  EKG reveals sinus tachycardia 101 bpm.  Reviewed labs, imaging, and pertinent records as noted above in the HPI.  Assessment and Plan:  Community-acquired pneumonia Acute.  Patient presented with complaints of cough and shortness of breath.  Initially was noted to be tachycardic and tachypnea meeting SIRS criteria with low-grade fever of 100.1 F.  Chest x-ray noted multifocal infiltrates on the right lung concerning for multifocal pneumonia.  Procalcitonin 0.19 and lactic acid was within normal limits on 2 separate readings..  Patient had initially been started on empiric antibiotics of vancomycin, aztreonam, and metronidazole. MRSA screen was negative.  Initially treated for concern for possible community-acquired pneumonia.  Question if symptoms may be more so related to diastolic congestive heart failure exacerbation. -Admit to a telemetry bed -Nasal cannula oxygen as needed to maintain O2 saturation greater than 92% -Incentive spirometry and flutter valve -Follow-up blood cultures -Continue empiric antibiotics of vancomycin and aztreonam -Mucinex  Diastolic congestive heart failure Acute on chronic.  On physical exam patient with at least 2+ pitting bilateral lower extremity edema.  BNP was noted to be elevated at 400.3.  Last echocardiogram from 2020 noted EF to be 60 to 65% with  impaired relaxation. -Strict I&O's and daily weights -Stop IV fluids -Check echocardiogram -Lasix 40 mg IV twice daily  Hypertensive urgency Blood pressures initially elevated up to 201/70. -Continue home medication regimen of amlodipine, Coreg, and hydralazine -Hydralazine IV as needed  Moderate persistent asthma Acute on chronic.  Patient noted to have wheezing on physical exam. -Schedule DuoNeb breathing treatments with albuterol nebs as needed -Brovana and budesonide nebs twice daily -Prednisone -Continue Singulair  Anemia of chronic disease Hemoglobin noted to be 9.2 ->8.3. Patient did not report any complaints of bleeding.  She had been given IV fluids while in the ED which may have led to drop in blood counts.  Vital signs are otherwise noted to be stable. -Recheck CBC in a.m.  Hypomagnesemia Acute.  Magnesium 1.4. -Give 2 g of magnesium sulfate IV -Continue to monitor and replace as needed  Chronic kidney disease stage IV Creatinine 3.02-> 2.99 which appears around patient's baseline.  Patient follows with Dr. [name] in outpatient setting -Continue to monitor with IV diuresis -Consider consulting nephrology if needed   Hyperlipidemia -Continue atorvastatin  Anxiety -Continue citalopram and Ativan as needed  Obesity BMI 30.66 kg/m DVT prophylaxis: Heparin Advance Care Planning:   Code Status: Full Code   Consults:   Family Communication: Daughter updated at bedside  Severity of Illness: The appropriate patient status for this patient is INPATIENT. Inpatient status is judged to be reasonable and necessary in order to provide the required intensity of service to ensure the patient's safety. The patient's presenting symptoms, physical exam findings, and initial radiographic and laboratory data in the context of their chronic comorbidities is felt to place them at high risk for further clinical deterioration. Furthermore, it is not anticipated that the patient will  be medically stable for  discharge from the hospital within 2 midnights of admission.   * I certify that at the point of admission it is my clinical judgment that the patient will require inpatient hospital care spanning beyond 2 midnights from the point of admission due to high intensity of service, high risk for further deterioration and high frequency of surveillance required.*  Author: Clydie Braun, MD 10/31/2022 7:27 AM  For on call review [email or web address].

## 2022-10-31 NOTE — Progress Notes (Signed)
  Carryover admission to the Day Admitter.  I discussed this case with the EDP, Ivar Drape, PA.  Per these discussions:   This is a 89 year old female with stage IV CKD, who is being admitted with sepsis due to right-sided multifocal pneumonia after presenting with approximately 10 days of progressive shortness of breath, cough.  She had presented with similar complaints to urgent care approximately 10 days ago, at which time she was felt to possess an asthma exacerbation.  She was started on azithromycin and prednisone, but notes no improvement in her respiratory status in the interval with these interventions, prompting her to present to the emergency department this evening for further evaluation management thereof.  Vital signs in the ED this evening were notable for temperature max 100.1, heart rates in the low 100s, maintaining O2 sats in the mid to high 90s on room air.  Labs notable for mild leukocytosis.  Chest x-ray reported to show evidence of multifocal right-sided infiltrates.  She has a reported history of allergies to penicillin, prompting EDP to consult pharmacy for recommendations regarding antibiotic selection.  Subsequently, the patient was started on IV vancomycin and aztreonam.  I have placed an order for inpatient admission to med/tele for further evaluation management of the above.  I have placed some additional preliminary admit orders via the adult multi-morbid admission order set. I have also ordered procalcitonin level, incentive spirometry, flutter valve, and have ordered morning labs in the form of CMP, CBC, serum magnesium and phosphorus levels.    Newton Pigg, DO Hospitalist

## 2022-10-31 NOTE — Sepsis Progress Note (Signed)
Elink monitoring for the code sepsis protocol.  

## 2022-10-31 NOTE — Telephone Encounter (Signed)
Spoke to [employer], pt's granddaughter(on DPR). Inform we will cancel pt appt today as she is admitted in hospital and to give [place] a call to reschedule appt when they are ready.

## 2022-10-31 NOTE — Progress Notes (Addendum)
Pharmacy Antibiotic Note  [name] is a 89 y.o. female admitted on 10/30/2022 with SOB with concerns for sepsis. She was previously treated with aztreonam and steroids for respiratory infection. Pharmacy has been consulted for aztreonam and vancomycin dosing. WBC 5.7, temp 100.1, sCr unknown, and CXR showing patchy opacities in the right lung.  Plan: Aztreonam 2g IV x1  Vancomycin 1750 mg IV x1 Follow up BMP for continued dosing of antibiotics Monitor renal function, fever curve, WBC, and signs of improvement for antibiotic de-escalation and LOT  Weight: 71.2 kg (157 lb)  Temp (24hrs), Avg:99.3 F (37.4 C), Min:98.5 F (36.9 C), Max:100.1 F (37.8 C)  Recent Labs  Lab 10/30/22 1235  WBC 5.7    CrCl cannot be calculated (Patient's most recent lab result is older than the maximum 21 days allowed.).    Allergies  Allergen Reactions   Penicillins Nausea And Vomiting and Other (See Comments)    Has patient had a PCN reaction causing immediate rash, facial/tongue/throat swelling, SOB or lightheadedness with hypotension: Y Has patient had a PCN reaction causing severe rash involving mucus membranes or skin necrosis: Y Has patient had a PCN reaction that required hospitalization: N Has patient had a PCN reaction occurring within the last 10 years: N If all of the above answers are "NO", then may proceed with Cephalosporin use.    Aspirin Other (See Comments)    Wheezing Acetaminophen is OK    Antimicrobials this admission: Aztreonam 6/18 >>  Vancomycin 6/18 >>   Microbiology results: 6/18 BCx:  6/18 Resp panel:  Thank you for allowing pharmacy to be a part of this patient's care.  Arabella Merles, PharmD. [facility] PGY-1 10/31/2022 1:55 AM   ADDENDUM -sCr returned at 3.02 (bl~2.8-3) giving a CrCl of 11.8 ml/min. Discussed with the PA about use of vancomycin in this patient and agreed to get a MRSA swab prior to dose and assess if vanc is needed for the  patient.  Plan: Aztreonam 1g IV every 8 hours F/u MRSA swab for additional maintenance doses of vancomycin  Thanks,  Arabella Merles, PharmD. [facility] PGY-1 10/31/2022 2:51 AM

## 2022-11-01 DIAGNOSIS — I5033 Acute on chronic diastolic (congestive) heart failure: Secondary | ICD-10-CM | POA: Diagnosis not present

## 2022-11-01 DIAGNOSIS — N184 Chronic kidney disease, stage 4 (severe): Secondary | ICD-10-CM | POA: Diagnosis not present

## 2022-11-01 DIAGNOSIS — J189 Pneumonia, unspecified organism: Secondary | ICD-10-CM

## 2022-11-01 LAB — CBC
HCT: 26.1 % — ABNORMAL LOW (ref 36.0–46.0)
Hemoglobin: 8.3 g/dL — ABNORMAL LOW (ref 12.0–15.0)
MCH: 30 pg (ref 26.0–34.0)
MCHC: 31.8 g/dL (ref 30.0–36.0)
MCV: 94.2 fL (ref 80.0–100.0)
Platelets: 177 10*3/uL (ref 150–400)
RBC: 2.77 MIL/uL — ABNORMAL LOW (ref 3.87–5.11)
RDW: 15.2 % (ref 11.5–15.5)
WBC: 11.6 10*3/uL — ABNORMAL HIGH (ref 4.0–10.5)
nRBC: 0 % (ref 0.0–0.2)

## 2022-11-01 LAB — RESPIRATORY PANEL BY PCR

## 2022-11-01 LAB — BASIC METABOLIC PANEL
Anion gap: 10 (ref 5–15)
BUN: 54 mg/dL — ABNORMAL HIGH (ref 8–23)
CO2: 19 mmol/L — ABNORMAL LOW (ref 22–32)
Calcium: 7.7 mg/dL — ABNORMAL LOW (ref 8.9–10.3)
Chloride: 107 mmol/L (ref 98–111)
Creatinine, Ser: 2.95 mg/dL — ABNORMAL HIGH (ref 0.44–1.00)
GFR, Estimated: 15 mL/min — ABNORMAL LOW (ref >60)
Glucose, Bld: 136 mg/dL — ABNORMAL HIGH (ref 70–99)
Potassium: 4.5 mmol/L (ref 3.5–5.1)
Sodium: 136 mmol/L (ref 135–145)

## 2022-11-01 LAB — TYPE AND SCREEN
ABO/RH(D): B POS
Antibody Screen: NEGATIVE

## 2022-11-01 LAB — MAGNESIUM: Magnesium: 1.8 mg/dL (ref 1.7–2.4)

## 2022-11-01 MED ORDER — FUROSEMIDE 10 MG/ML IJ SOLN
40.0000 mg | Freq: Once | INTRAMUSCULAR | Status: AC
  Administered 2022-11-01: 40 mg via INTRAVENOUS
  Filled 2022-11-01: qty 4

## 2022-11-01 MED ORDER — FUROSEMIDE 10 MG/ML IJ SOLN
80.0000 mg | Freq: Two times a day (BID) | INTRAMUSCULAR | Status: DC
  Administered 2022-11-01 – 2022-11-02 (×3): 80 mg via INTRAVENOUS
  Filled 2022-11-01 (×5): qty 8

## 2022-11-01 MED ORDER — LEVOFLOXACIN 500 MG PO TABS
500.0000 mg | ORAL_TABLET | ORAL | Status: AC
  Administered 2022-11-01 – 2022-11-05 (×3): 500 mg via ORAL
  Filled 2022-11-01 (×5): qty 1

## 2022-11-01 NOTE — Evaluation (Signed)
Occupational Therapy Evaluation/Discharge Patient Details Name: [name] MRN: [id number] DOB: 09/19/1933 Today's Date: 11/01/2022   History of Present Illness Pt is a 89 y.o. F who presents 10/30/2022 with complaints of shortness of breath over 2 weeks. Chest x-ray showing multifocal PNA. COVID-19 RSV and influenza PCR negative. Significant PMH: HTN, HLD, asthma, CKD stage IV, DM2, CKD, lupus, GERD.   Clinical Impression   PTA, pt's granddaughter lives with her, typically Modified Independent with ADLs (occasional assist for compression stocking mgmt), IADLs, driving and mobility with cane use outside of the home. Pt presents now fairly close to baseline for ADLs though limited by BLE swelling requiring up to Min A for LB dressing tasks today. Anticipate as BLE edema improves and PNA clears that pt will quickly return to full PLOF without need for OT follow up. Pt also has family to assist at DC as needed. Provided energy conservation education with handout to maximize carryover. Encouraged continued OOB activity during admission and upon return home. Please reconsult if needs change acutely.      Recommendations for follow up therapy are one component of a multi-disciplinary discharge planning process, led by the attending physician.  Recommendations may be updated based on patient status, additional functional criteria and insurance authorization.   Assistance Recommended at Discharge PRN  Patient can return home with the following Assistance with cooking/housework    Functional Status Assessment  Patient has not had a recent decline in their functional status  Equipment Recommendations  None recommended by OT    Recommendations for Other Services       Precautions / Restrictions Precautions Precautions: Fall Precaution Comments: low fall risk Restrictions Weight Bearing Restrictions: No      Mobility Bed Mobility               General bed mobility comments:  received after ambulating with PT    Transfers Overall transfer level: Modified independent Equipment used: None               General transfer comment: able to stand at bedside without cane      Balance Overall balance assessment: Needs assistance Sitting-balance support: No upper extremity supported, Feet supported Sitting balance-Leahy Scale: Good     Standing balance support: No upper extremity supported, During functional activity Standing balance-Leahy Scale: Fair Standing balance comment: Fair+; able to reach overhead and behind back without UE support or LOB                           ADL either performed or assessed with clinical judgement   ADL Overall ADL's : At baseline                                       General ADL Comments: able to mobilize with cane without LOB, mildly winded. Educated on energy conservation strategies (provided handout), continued activity at home to combat PNA, use of shower chair initially and tub transfer assist if needed at home. Some assist provided for socks due to BLE swelling (worse than normal per pt)     Vision Ability to See in Adequate Light: 0 Adequate Patient Visual Report: No change from baseline Vision Assessment?: No apparent visual deficits     Perception     Praxis      Pertinent Vitals/Pain Pain Assessment Pain Assessment: No/denies pain  Hand Dominance Right   Extremity/Trunk Assessment Upper Extremity Assessment Upper Extremity Assessment: Overall WFL for tasks assessed;LUE deficits/detail LUE Deficits / Details: minor limitations in L shoulder flexion due to reported IV discomfort; functional and able to reach overhead   Lower Extremity Assessment Lower Extremity Assessment: Defer to PT evaluation   Cervical / Trunk Assessment Cervical / Trunk Assessment: Normal   Communication Communication Communication: No difficulties   Cognition Arousal/Alertness:  Awake/alert Behavior During Therapy: WFL for tasks assessed/performed Overall Cognitive Status: Within Functional Limits for tasks assessed                                       General Comments  Family at bedside. received at end of PT eval w/ SpO2 WFL on RA post walk    Exercises     Shoulder Instructions      Home Living Family/patient expects to be discharged to:: Private residence Living Arrangements: Children Available Help at Discharge: Family;Available 24 hours/day (granddaughter works as Clinical biochemist, out for the summer) Type of Home: International aid/development worker Shower/Tub: Chief Strategy Officer: Standard     Home Equipment: Gilmer Mor - single point;Shower seat          Prior Functioning/Environment Prior Level of Function : Independent/Modified Independent;Driving             Mobility Comments: uses cane primarily for mobility outside of the home ADLs Comments: drives, grocery shops, goes to church. 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 and sometimes able to manage        OT Problem List: Cardiopulmonary status limiting activity      OT Treatment/Interventions:      OT Goals(Current goals can be found in the care plan section) Acute Rehab OT Goals Patient Stated Goal: clear up PNA OT Goal Formulation: All assessment and education complete, DC therapy  OT Frequency:      Co-evaluation              AM-PAC OT "6 Clicks" Daily Activity     Outcome Measure Help from another person eating meals?: None Help from another person taking care of personal grooming?: None Help from another person toileting, which includes using toliet, bedpan, or urinal?: A Little Help from another person bathing (including washing, rinsing, drying)?: A Little Help from another person to put on and taking off regular upper body clothing?: None Help from another person to put on and taking off regular lower body clothing?: A  Little 6 Click Score: 21   End of Session    Activity Tolerance: Patient tolerated treatment well Patient left: in bed;with call bell/phone within reach;with family/visitor present;Other (comment) (wanted to sit EOB for a few min)  OT Visit Diagnosis: Muscle weakness (generalized) (M62.81)                Time: 0981-1914 OT Time Calculation (min): 17 min Charges:  OT General Charges $OT Visit: 1 Visit OT Evaluation $OT Eval Low Complexity: 1 Low  Bradd Canary, OTR/L Acute Rehab Services Office: [phone]   Lorre Munroe 11/01/2022, 2:40 PM

## 2022-11-01 NOTE — Progress Notes (Signed)
[facility] PROGRESS NOTE   Hayla Prigge VWU:[id number] DOB: [birth year] DOA: 10/30/2022  PCP: [name], MD  Brief History/Interval Summary: 89 y.o. female with medical history significant of essential hypertension, hyperlipidemia, asthma, CKD stage IV, DM type II, DVT, CKD, history of lupus, GERD who presented with complaints of shortness of breath over 2 weeks.  Concern was for multifocal pneumonia.  She was hospitalized for further management.  Consultants: None  Procedures: None    Subjective/Interval History: Patient mentioned that she is feeling slightly better today compared to yesterday though she is still short of breath.  Still has a cough with yellowish expectoration.  Denies any chest pain. Lower extremity swelling is worse than usual.  Denies any recent medication changes.  Has been taking her medications without missing any doses.     Assessment/Plan:  Community-acquired pneumonia Chest x-ray showed multifocal pneumonia. Procalcitonin 0.19.  Lactic acid level was normal. COVID-19 RSV and influenza PCR negative. Patient with allergy to penicillin. Started on aztreonam and vancomycin. MRSA PCR is negative.  WBC is better. Will check respiratory viral panel. Should be able to safely de-escalate.  Will change to levofloxacin.  Acute diastolic CHF Patient with 2+ pitting edema bilateral lower extremities.  BNP was noted to be elevated.  Echocardiogram shows normal LVEF. Patient started on IV furosemide which will be continued.  Will increase the dose since her baseline creatinine is high to begin with.  Strict ins and outs and daily weights.  Hypertensive urgency Improved.  Continue with current hypertensive regimen.  Moderate persistent asthma Continue with inhalers.  Thought to have asthma exacerbation on admission.  Noted to be on prednisone which will be continued for now.  Anemia of chronic disease Hemoglobin is low but stable.  No  evidence of overt bleeding.  Continue to monitor.  Hypomagnesemia Was repleted.  Check labs again tomorrow.  CKD stage IV Followed by Dr. [name]. Renal function seems to be close to baseline.  Monitor closely while she is getting diuretics.  Monitor urine output.  Avoid nephrotoxic agents.  Hyperlipidemia Continue statin.  Anxiety Continue with home medications.  Obesity Estimated body mass index is 30.66 kg/m as calculated from the following:   Height as of 06/29/22: 5' (1.524 m).   Weight as of this encounter: 71.2 kg.  DVT Prophylaxis: Subcutaneous heparin Code Status: Full code Family Communication: Discussed with patient and her granddaughter who was at the bedside Disposition Plan: PT and OT evaluation.  Hopefully return home when improved  Status is: Inpatient Remains inpatient appropriate because: Acute CHF, community-acquired pneumonia      Medications: Scheduled:  amLODipine  5 mg Oral Daily   arformoterol  15 mcg Nebulization BID   atorvastatin  40 mg Oral Daily   budesonide (PULMICORT) nebulizer solution  0.5 mg Nebulization BID   carvedilol  6.25 mg Oral Daily   citalopram  20 mg Oral QPM   clopidogrel  75 mg Oral q AM   doxazosin  6 mg Oral QHS   furosemide  40 mg Intravenous BID   guaiFENesin  600 mg Oral BID   heparin  5,000 Units Subcutaneous Q8H   hydrALAZINE  75 mg Oral TID   ipratropium-albuterol  3 mL Nebulization BID   montelukast  10 mg Oral Daily   pantoprazole  40 mg Oral BID AC   predniSONE  40 mg Oral Q breakfast   sodium chloride flush  3 mL Intravenous Q12H   Continuous:  aztreonam 1 g (11/01/22  77)   UJW:[id number] **OR** acetaminophen, albuterol, hydrALAZINE, LORazepam, melatonin, ondansetron (ZOFRAN) IV  Antibiotics: Anti-infectives (From admission, onward)    Start     Dose/Rate Route Frequency Ordered Stop   10/31/22 1000  aztreonam (AZACTAM) 1 g in sodium chloride 0.9 % 100 mL IVPB        1 g 200 mL/hr over 30  Minutes Intravenous Every 8 hours 10/31/22 0255     10/31/22 0200  vancomycin (VANCOREADY) IVPB 1750 mg/350 mL        1,750 mg 175 mL/hr over 120 Minutes Intravenous  Once 10/31/22 0149 10/31/22 0710   10/31/22 0145  aztreonam (AZACTAM) 2 g in sodium chloride 0.9 % 100 mL IVPB        2 g 200 mL/hr over 30 Minutes Intravenous  Once 10/31/22 0138 10/31/22 0249   10/31/22 0145  metroNIDAZOLE (FLAGYL) IVPB 500 mg        500 mg 100 mL/hr over 60 Minutes Intravenous  Once 10/31/22 0138 10/31/22 0354   10/31/22 0145  vancomycin (VANCOCIN) IVPB 1000 mg/200 mL premix  Status:  Discontinued        1,000 mg 200 mL/hr over 60 Minutes Intravenous  Once 10/31/22 0138 10/31/22 0149       Objective:  Vital Signs  Vitals:   11/01/22 0736 11/01/22 0748 11/01/22 0911 11/01/22 0914  BP: (!) 174/64  (!) 180/66 (!) 172/56  Pulse: 80 82 78 78  Resp: 15 17    Temp: 98.2 F (36.8 C)     TempSrc:      SpO2: 97% 98%    Weight:        Intake/Output Summary (Last 24 hours) at 11/01/2022 1020 Last data filed at 11/01/2022 0600 Gross per 24 hour  Intake 1110 ml  Output 350 ml  Net 760 ml   Filed Weights   10/31/22 0137  Weight: 71.2 kg    General appearance: Awake alert.  In no distress Resp: Tachypneic at rest.  No use of accessory muscles.  Coarse breath sounds bilaterally with crackles at the bases.  No wheezing or rhonchi. Cardio: S1-S2 is normal regular.  No S3-S4.  No rubs murmurs or bruit GI: Abdomen is soft.  Nontender nondistended.  Bowel sounds are present normal.  No masses organomegaly Extremities: 2+ edema bilateral lower extremities. Neurologic: Alert and oriented x3.  No focal neurological deficits.    Lab Results:  Data Reviewed: I have personally reviewed following labs and reports of the imaging studies  CBC: Recent Labs  Lab 10/30/22 1235 10/31/22 0149 10/31/22 0427 11/01/22 0305  WBC 5.7 11.9* 11.4* 11.6*  NEUTROABS 3.9 10.0* 9.7*  --   HGB 8.7* 9.2* 8.3* 8.3*   HCT 28.0* 30.5* 26.8* 26.1*  MCV 93.6 99.0 94.4 94.2  PLT 181 169 172 177    Basic Metabolic Panel: Recent Labs  Lab 10/31/22 0149 10/31/22 0427 11/01/22 0305  NA 139 139 136  K 4.4 4.4 4.5  CL 108 108 107  CO2 20* 19* 19*  GLUCOSE 127* 123* 136*  BUN 58* 56* 54*  CREATININE 3.02* 2.99* 2.95*  CALCIUM 7.9* 7.7* 7.7*  MG  --  1.4* 1.8  PHOS  --  3.8  --     GFR: Estimated Creatinine Clearance: 12.1 mL/min (A) (by C-G formula based on SCr of 2.95 mg/dL (H)).  Liver Function Tests: Recent Labs  Lab 10/31/22 0149 10/31/22 0427  AST 16 15  ALT 9 8  ALKPHOS 45 39  BILITOT 0.6  0.5  PROT 5.5* 5.0*  ALBUMIN 2.5* 2.3*     Coagulation Profile: Recent Labs  Lab 10/31/22 0149  INR 1.0     Recent Results (from the past 240 hour(s))  Culture, blood (single)     Status: None (Preliminary result)   Collection Time: 10/31/22  1:47 AM   Specimen: BLOOD  Result Value Ref Range Status   Specimen Description BLOOD RIGHT ANTECUBITAL  Final   Special Requests   Final    BOTTLES DRAWN AEROBIC AND ANAEROBIC Blood Culture results may not be optimal due to an excessive volume of blood received in culture bottles   Culture   Final    NO GROWTH 1 DAY Performed at [facility], [address], [place], [place] [address]    Report Status PENDING  Incomplete  Resp panel by RT-PCR (RSV, Flu A&B, Covid) Urine, Clean Catch     Status: None   Collection Time: 10/31/22  2:09 AM   Specimen: Urine, Clean Catch; Nasal Swab  Result Value Ref Range Status   SARS Coronavirus 2 by RT PCR NEGATIVE NEGATIVE Final   Influenza A by PCR NEGATIVE NEGATIVE Final   Influenza B by PCR NEGATIVE NEGATIVE Final    Comment: (NOTE) The Xpert Xpress SARS-CoV-2/FLU/RSV plus assay is intended as an aid in the diagnosis of influenza from Nasopharyngeal swab specimens and should not be used as a sole basis for treatment. Nasal washings and aspirates are unacceptable for Xpert Xpress  SARS-CoV-2/FLU/RSV testing.  Fact Sheet for Patients: BloggerCourse.com  Fact Sheet for Healthcare Providers: SeriousBroker.it  This test is not yet approved or cleared by the [place] FDA and has been authorized for detection and/or diagnosis of SARS-CoV-2 by FDA under an Emergency Use Authorization (EUA). This EUA will remain in effect (meaning this test can be used) for the duration of the COVID-19 declaration under Section 564(b)(1) of the Act, 21 U.S.C. section 360bbb-3(b)(1), unless the authorization is terminated or revoked.     Resp Syncytial Virus by PCR NEGATIVE NEGATIVE Final    Comment: (NOTE) Fact Sheet for Patients: BloggerCourse.com  Fact Sheet for Healthcare Providers: SeriousBroker.it  This test is not yet approved or cleared by the [place] FDA and has been authorized for detection and/or diagnosis of SARS-CoV-2 by FDA under an Emergency Use Authorization (EUA). This EUA will remain in effect (meaning this test can be used) for the duration of the COVID-19 declaration under Section 564(b)(1) of the Act, 21 U.S.C. section 360bbb-3(b)(1), unless the authorization is terminated or revoked.  Performed at [facility], [address] [place], [place] [address]   MRSA Next Gen by PCR, Nasal     Status: None   Collection Time: 10/31/22  2:49 AM   Specimen: Nasal Mucosa; Nasal Swab  Result Value Ref Range Status   MRSA by PCR Next Gen NOT DETECTED NOT DETECTED Final    Comment: (NOTE) The GeneXpert MRSA Assay (FDA approved for NASAL specimens only), is one component of a comprehensive MRSA colonization surveillance program. It is not intended to diagnose MRSA infection nor to guide or monitor treatment for MRSA infections. Test performance is not FDA approved in patients less than 5 years old. Performed at [facility], [address], [place], [place] [address]       Radiology Studies: ECHOCARDIOGRAM COMPLETE  Result Date: 10/31/2022    ECHOCARDIOGRAM REPORT   Patient Name:   [name] [facility] Date of Exam: 10/31/2022 Medical Rec #:  [id number]          Height:       60.0 in Accession #:    [id number]         Weight:       157.0 lb Date of Birth:  1933-08-14           BSA:          1.684 m Patient Age:    88 years           BP:           186/88 mmHg Patient Gender: F                  HR:           81 bpm. Exam Location:  Inpatient Procedure: 2D Echo, Cardiac Doppler and Color Doppler Indications:    CHF  History:        Patient has prior history of Echocardiogram examinations, most                 recent 11/14/2018. Risk Factors:Hypertension.  Sonographer:    Darlys Gales Referring Phys: [id number] RONDELL A SMITH IMPRESSIONS  1. Left ventricular ejection fraction, by estimation, is 60 to 65%. The left ventricle has normal function. The left ventricle has no regional wall motion abnormalities. There is moderate concentric left ventricular hypertrophy. Left ventricular diastolic parameters are consistent with Grade II diastolic dysfunction (pseudonormalization). Elevated left atrial pressure.  2. Right ventricular systolic function is normal. The right ventricular size is mildly enlarged. There is moderately elevated pulmonary artery systolic pressure. The estimated right ventricular systolic pressure is 49.0 mmHg.  3. Left atrial size was severely dilated.  4. The mitral valve is normal in structure. Mild mitral valve regurgitation.  5. The aortic valve is tricuspid. There is mild calcification of the aortic valve. There is mild thickening of the aortic valve. Aortic valve regurgitation is not visualized. Aortic valve sclerosis is present, with no evidence of aortic valve stenosis.  6. The inferior vena cava is dilated in size with >50% respiratory variability, suggesting right atrial pressure of 8 mmHg.  7. Cannot exclude a small PFO.  Comparison(s): No significant change from prior study. Prior images reviewed side by side. FINDINGS  Left Ventricle: Left ventricular ejection fraction, by estimation, is 60 to 65%. The left ventricle has normal function. The left ventricle has no regional wall motion abnormalities. The left ventricular internal cavity size was normal in size. There is  moderate concentric left ventricular hypertrophy. Left ventricular diastolic parameters are consistent with Grade II diastolic dysfunction (pseudonormalization). Elevated left atrial pressure. Right Ventricle: The right ventricular size is mildly enlarged. No increase in right ventricular wall thickness. Right ventricular systolic function is normal. There is moderately elevated pulmonary artery systolic pressure. The tricuspid regurgitant velocity is 3.20 m/s, and with an assumed right atrial pressure of 8 mmHg, the estimated right ventricular systolic pressure is 49.0 mmHg. Left Atrium: Left atrial size was severely dilated. Right Atrium: Right atrial size was normal in size. Pericardium: There is no evidence of pericardial effusion. Mitral Valve: The mitral valve is normal in structure. Mild mitral annular calcification. Mild mitral valve regurgitation, with centrally-directed jet. Tricuspid Valve: The tricuspid valve is normal in structure. Tricuspid valve regurgitation is mild. Aortic Valve: The aortic valve is tricuspid. There is mild calcification of the aortic valve. There is mild thickening of the aortic valve. Aortic valve regurgitation is not visualized. Aortic valve sclerosis is present, with no  evidence of aortic valve stenosis. Aortic valve mean gradient measures 6.0 mmHg. Aortic valve peak gradient measures 9.9 mmHg. Pulmonic Valve: The pulmonic valve was grossly normal. Pulmonic valve regurgitation is not visualized. No evidence of pulmonic stenosis. Aorta: The aortic root and ascending aorta are structurally normal, with no evidence of dilitation.  Venous: The inferior vena cava is dilated in size with greater than 50% respiratory variability, suggesting right atrial pressure of 8 mmHg. IAS/Shunts: The interatrial septum is aneurysmal. There is right bowing of the interatrial septum, suggestive of elevated left atrial pressure. Cannot exclude a small PFO.  LEFT VENTRICLE PLAX 2D LVIDd:         4.80 cm Diastology LVIDs:         3.50 cm LV e' medial:    6.42 cm/s LV PW:         1.60 cm LV E/e' medial:  18.2 LV IVS:        1.30 cm LV e' lateral:   6.85 cm/s                        LV E/e' lateral: 17.1  RIGHT VENTRICLE             IVC RV S prime:     12.30 cm/s  IVC diam: 2.70 cm TAPSE (M-mode): 3.2 cm LEFT ATRIUM             Index        RIGHT ATRIUM           Index LA Vol (A2C):   74.6 ml 44.30 ml/m  RA Area:     13.90 cm LA Vol (A4C):   60.9 ml 36.16 ml/m  RA Volume:   29.90 ml  17.75 ml/m LA Biplane Vol: 72.4 ml 42.99 ml/m  AORTIC VALVE AV Vmax:           157.00 cm/s AV Vmean:          114.000 cm/s AV VTI:            0.349 m AV Peak Grad:      9.9 mmHg AV Mean Grad:      6.0 mmHg LVOT Vmax:         112.00 cm/s LVOT Vmean:        75.600 cm/s LVOT VTI:          0.262 m LVOT/AV VTI ratio: 0.75  AORTA Ao Root diam: 2.70 cm Ao Asc diam:  2.30 cm MITRAL VALVE                TRICUSPID VALVE MV Area (PHT): 4.12 cm     TR Peak grad:   41.0 mmHg MV Decel Time: 184 msec     TR Vmax:        320.00 cm/s MV E velocity: 117.00 cm/s MV A velocity: 114.00 cm/s  SHUNTS MV E/A ratio:  1.03         Systemic VTI: 0.26 m Rachelle Hora Croitoru MD Electronically signed by Thurmon Fair MD Signature Date/Time: 10/31/2022/2:45:06 PM    Final    DG Chest 2 View  Result Date: 10/31/2022 CLINICAL DATA:  Shortness of breath EXAM: CHEST - 2 VIEW COMPARISON:  08/03/2022 FINDINGS: Stable enlargement of the cardiomediastinal silhouette. Aortic atherosclerotic calcification. Chronic bronchitic changes and hyperinflation. Patchy airspace opacities in the right mid lung and right lower lung  suspicious for multifocal pneumonia. No pleural effusion or pneumothorax. IMPRESSION: Patchy opacities in the right lung  suspicious for multifocal pneumonia. Follow-up in 6-8 weeks is recommended to ensure resolution. Electronically Signed   By: Minerva Fester M.D.   On: 10/31/2022 01:26       LOS: 1 day   Kabria Hetzer  [facility] Pager on [email or web address]  11/01/2022, 10:20 AM

## 2022-11-01 NOTE — Evaluation (Signed)
Physical Therapy Evaluation Patient Details Name: [name] MRN: [id number] DOB: [birth year] Today's Date: 11/01/2022  History of Present Illness  Pt is a 89 y.o. F who presents 10/30/2022 with complaints of shortness of breath over 2 weeks. Chest x-ray showing multifocal PNA. COVID-19 RSV and influenza PCR negative. Significant PMH: HTN, HLD, asthma, CKD stage IV, DM2, CKD, lupus, GERD.   Clinical Impression  PTA pt reports independence with functional mobility and ADL's, lives at home with granddaughter who is home for the summer from teaching. Today, pt with supervision for OOB mobility using SPC to amb 100'. Pt reports they feel they are just about at their baseline, with just slight reduction in gait speed. Pt educated on energy conservation techniques and progressive exercise tolerance. All acute PT needs for this patient have been met at this time. Thank you for the consult.     Recommendations for follow up therapy are one component of a multi-disciplinary discharge planning process, led by the attending physician.  Recommendations may be updated based on patient status, additional functional criteria and insurance authorization.  Follow Up Recommendations       Assistance Recommended at Discharge PRN  Patient can return home with the following  Assist for transportation;Assistance with cooking/housework;A little help with walking and/or transfers    Equipment Recommendations None recommended by PT  Recommendations for Other Services       Functional Status Assessment       Precautions / Restrictions Precautions Precautions: Fall Precaution Comments: low fall risk Restrictions Weight Bearing Restrictions: No      Mobility  Bed Mobility Overal bed mobility: Modified Independent             General bed mobility comments: increased time    Transfers Overall transfer level: Modified independent Equipment used: None               General transfer  comment: stands with BUE push off, grabs cane when standing    Ambulation/Gait Ambulation/Gait assistance: Supervision Gait Distance (Feet): 100 Feet Assistive device: Straight cane Gait Pattern/deviations: Step-through pattern, Decreased stride length, Decreased step length - right, Decreased step length - left, Narrow base of support Gait velocity: reduced Gait velocity interpretation: <1.31 ft/sec, indicative of household ambulator   General Gait Details: uses SPC in RUE  Stairs            Wheelchair Mobility    Modified Rankin (Stroke Patients Only)       Balance Overall balance assessment: Needs assistance Sitting-balance support: No upper extremity supported, Feet supported Sitting balance-Leahy Scale: Good     Standing balance support: Single extremity supported, During functional activity Standing balance-Leahy Scale: Fair Standing balance comment: Can turn torso in static standing to scan the room behind her and see her family, no LOB                             Pertinent Vitals/Pain Pain Assessment Pain Assessment: No/denies pain    Home Living Family/patient expects to be discharged to:: Private residence Living Arrangements: Children Available Help at Discharge: Family;Available 24 hours/day Type of Home: House           Home Equipment: Gilmer Mor - single point;Shower seat      Prior Function Prior Level of Function : Independent/Modified Independent;Driving             Mobility Comments: uses cane primarily for mobility outside of the home ADLs Comments: drives,  grocery shops, goes to church. 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 and sometimes able to manage     Hand Dominance   Dominant Hand: Right    Extremity/Trunk Assessment   Upper Extremity Assessment Upper Extremity Assessment: Overall WFL for tasks assessed LUE Deficits / Details: minor limitations in L shoulder flexion due to reported IV discomfort;  functional and able to reach overhead    Lower Extremity Assessment Lower Extremity Assessment: Overall WFL for tasks assessed;RLE deficits/detail;LLE deficits/detail RLE Deficits / Details: Increased edema LLE Deficits / Details: Increased edema    Cervical / Trunk Assessment Cervical / Trunk Assessment: Normal  Communication   Communication: No difficulties  Cognition Arousal/Alertness: Awake/alert Behavior During Therapy: WFL for tasks assessed/performed Overall Cognitive Status: Within Functional Limits for tasks assessed                                          General Comments General comments (skin integrity, edema, etc.): O2 sats assessed post amb, 96%, no reports of SOB throughout session. Socks changed due to indentation in BLE. Pt left EOB with OT entering room to start session.    Exercises     Assessment/Plan    PT Assessment Patient does not need any further PT services  PT Problem List         PT Treatment Interventions      PT Goals (Current goals can be found in the Care Plan section)  Acute Rehab PT Goals Patient Stated Goal: ready to go home PT Goal Formulation: All assessment and education complete, DC therapy    Frequency       Co-evaluation               AM-PAC PT "6 Clicks" Mobility  Outcome Measure Help needed turning from your back to your side while in a flat bed without using bedrails?: None Help needed moving from lying on your back to sitting on the side of a flat bed without using bedrails?: None Help needed moving to and from a bed to a chair (including a wheelchair)?: None Help needed standing up from a chair using your arms (e.g., wheelchair or bedside chair)?: None Help needed to walk in hospital room?: A Little Help needed climbing 3-5 steps with a railing? : A Little 6 Click Score: 22    End of Session Equipment Utilized During Treatment: Gait belt Activity Tolerance: Patient tolerated treatment  well Patient left: in bed;with call bell/phone within reach;with family/visitor present Nurse Communication: Mobility status PT Visit Diagnosis: Other (comment) (decrease cardiopulmonary endurance)    Time: 1358-1410 PT Time Calculation (min) (ACUTE ONLY): 12 min   Charges:   PT Evaluation $PT Eval Low Complexity: 1 Low          Hendricks Milo, SPT  Acute Rehabilitation Services   Hendricks Milo 11/01/2022, 3:09 PM

## 2022-11-02 ENCOUNTER — Inpatient Hospital Stay (HOSPITAL_COMMUNITY): Payer: Medicare Other

## 2022-11-02 DIAGNOSIS — M7989 Other specified soft tissue disorders: Secondary | ICD-10-CM | POA: Diagnosis not present

## 2022-11-02 DIAGNOSIS — J189 Pneumonia, unspecified organism: Secondary | ICD-10-CM | POA: Diagnosis not present

## 2022-11-02 DIAGNOSIS — I5033 Acute on chronic diastolic (congestive) heart failure: Secondary | ICD-10-CM | POA: Diagnosis not present

## 2022-11-02 DIAGNOSIS — N184 Chronic kidney disease, stage 4 (severe): Secondary | ICD-10-CM | POA: Diagnosis not present

## 2022-11-02 LAB — CBC
HCT: 25.3 % — ABNORMAL LOW (ref 36.0–46.0)
Hemoglobin: 7.9 g/dL — ABNORMAL LOW (ref 12.0–15.0)
MCH: 29.5 pg (ref 26.0–34.0)
MCHC: 31.2 g/dL (ref 30.0–36.0)
MCV: 94.4 fL (ref 80.0–100.0)
Platelets: 178 10*3/uL (ref 150–400)
RBC: 2.68 MIL/uL — ABNORMAL LOW (ref 3.87–5.11)
RDW: 15.2 % (ref 11.5–15.5)
WBC: 10.9 10*3/uL — ABNORMAL HIGH (ref 4.0–10.5)
nRBC: 0 % (ref 0.0–0.2)

## 2022-11-02 LAB — BASIC METABOLIC PANEL
Anion gap: 11 (ref 5–15)
BUN: 53 mg/dL — ABNORMAL HIGH (ref 8–23)
CO2: 19 mmol/L — ABNORMAL LOW (ref 22–32)
Calcium: 7.9 mg/dL — ABNORMAL LOW (ref 8.9–10.3)
Chloride: 107 mmol/L (ref 98–111)
Creatinine, Ser: 3.2 mg/dL — ABNORMAL HIGH (ref 0.44–1.00)
GFR, Estimated: 14 mL/min — ABNORMAL LOW (ref >60)
Glucose, Bld: 143 mg/dL — ABNORMAL HIGH (ref 70–99)
Potassium: 4.4 mmol/L (ref 3.5–5.1)
Sodium: 137 mmol/L (ref 135–145)

## 2022-11-02 MED ORDER — CARVEDILOL 6.25 MG PO TABS
6.2500 mg | ORAL_TABLET | Freq: Two times a day (BID) | ORAL | Status: DC
  Administered 2022-11-02 – 2022-11-04 (×5): 6.25 mg via ORAL
  Filled 2022-11-02 (×5): qty 1

## 2022-11-02 MED ORDER — ALUM & MAG HYDROXIDE-SIMETH 200-200-20 MG/5ML PO SUSP
15.0000 mL | ORAL | Status: DC | PRN
  Administered 2022-11-02: 15 mL via ORAL
  Filled 2022-11-02: qty 30

## 2022-11-02 NOTE — Progress Notes (Signed)
Transition of Care [facility]) - Inpatient Brief Assessment   Patient Details  Name: [name] MRN: [id number] Date of Birth: 10-Sep-1933  Transition [facility]) CM/SW Contact:    Janae Bridgeman, RN Phone Number: 11/02/2022, 12:32 PM   Clinical Narrative: Patient was admitted for Community Acquired Pneumonia - patient lives with the granddaughter and is currently on RA.  Patient was evaluated by PT/OT and no needs for [facility] nor DME requested.  No TOC needs at this time.   Transition of Care Asessment: Insurance and Status: (P) Insurance coverage has been reviewed Patient has primary care physician: (P) Yes Home environment has been reviewed: (P) Yes Prior level of function:: (P) LIves with granddaughter in the home Prior/Current Home Services: (P) No current home services Social Determinants of Health Reivew: (P) SDOH reviewed no interventions necessary Readmission risk has been reviewed: (P) Yes Transition of care needs: (P) no transition of care needs at this time

## 2022-11-02 NOTE — Plan of Care (Signed)
Patient is resting in bed with family at bedside. No pain or distress is noted or verbalized by patient. Rhonchi was auscultated in lungs. All meds were given and tolerated well. Suction is at bedside for patient to suction sputum. Safety precautions were educated to patient. Plan of care is ongoing. Problem: Education: Goal: Knowledge of General Education information will improve Description: Including pain rating scale, medication(s)/side effects and non-pharmacologic comfort measures Outcome: Progressing   Problem: Health Behavior/Discharge Planning: Goal: Ability to manage health-related needs will improve Outcome: Progressing   Problem: Clinical Measurements: Goal: Ability to maintain clinical measurements within normal limits will improve Outcome: Progressing Goal: Will remain free from infection Outcome: Progressing Goal: Diagnostic test results will improve Outcome: Progressing Goal: Respiratory complications will improve Outcome: Progressing Goal: Cardiovascular complication will be avoided Outcome: Progressing   Problem: Activity: Goal: Risk for activity intolerance will decrease Outcome: Progressing   Problem: Nutrition: Goal: Adequate nutrition will be maintained Outcome: Progressing   Problem: Coping: Goal: Level of anxiety will decrease Outcome: Progressing   Problem: Elimination: Goal: Will not experience complications related to bowel motility Outcome: Progressing Goal: Will not experience complications related to urinary retention Outcome: Progressing   Problem: Pain Managment: Goal: General experience of comfort will improve Outcome: Progressing   Problem: Safety: Goal: Ability to remain free from injury will improve Outcome: Progressing   Problem: Skin Integrity: Goal: Risk for impaired skin integrity will decrease Outcome: Progressing   Problem: Activity: Goal: Ability to tolerate increased activity will improve Outcome: Progressing    Problem: Clinical Measurements: Goal: Ability to maintain a body temperature in the normal range will improve Outcome: Progressing   Problem: Respiratory: Goal: Ability to maintain adequate ventilation will improve Outcome: Progressing Goal: Ability to maintain a clear airway will improve Outcome: Progressing

## 2022-11-02 NOTE — Consult Note (Signed)
   [facility] Inpatient Consult   11/02/2022  Vincentia Daversa [facility] 04/22/1934 [id number]  [facility] [[facility]]  Accountable Care Organization [ACO] Patient: [facility]  Primary Care Provider:  [name], MD with [facility] is listed to provide the transition of care follow up.  *Patient is on Droplet Precaution  Patient screened for hospitalization with noted extreme high risk score for unplanned readmission risk and  to assess for potential [facility]  [[facility]] Care Management service needs for post hospital transition for community care coordination.  Review of patient's electronic medical record reveals patient has care coordination encounters with noted with [facility].   Plan:  Continue to follow progress and disposition to assess for post hospital community care coordination/management needs.  Referral request for community care coordination: following for needs for readmission prevention measures.  Of note, [facility] Care Management/Population Health does not replace or interfere with any arrangements made by the Inpatient Transition of Care team.  For questions contact:   Charlesetta Shanks, RN BSN CCM [facility]  [phone] business mobile phone Toll free office [phone]  *Concierge Line  [phone] Fax number: [phone] [email or web address]

## 2022-11-02 NOTE — Progress Notes (Signed)
Patient accidentally poured out and flushed urine down the toilet. Patient educated and reminded to keep urine in hat so that nurses and nursing assistant can measure. Patient understands. Plan of care ongoing.   Lawana Pai, RN

## 2022-11-02 NOTE — Plan of Care (Signed)
Patient [name], disoriented to time.  VSS throughout shift.  Diminished lungs, IS encouraged.  All meds given on time as ordered.  Oral suction set up to aid in productive cough.  Pt standby assist to bathroom.  BBLE 2+ edema, legs elevated.  POC maintained, will continue to monitor.  Problem: Education: Goal: Knowledge of General Education information will improve Description: Including pain rating scale, medication(s)/side effects and non-pharmacologic comfort measures Outcome: Progressing   Problem: Health Behavior/Discharge Planning: Goal: Ability to manage health-related needs will improve Outcome: Progressing   Problem: Clinical Measurements: Goal: Ability to maintain clinical measurements within normal limits will improve Outcome: Progressing Goal: Will remain free from infection Outcome: Progressing Goal: Diagnostic test results will improve Outcome: Progressing Goal: Respiratory complications will improve Outcome: Progressing Goal: Cardiovascular complication will be avoided Outcome: Progressing   Problem: Activity: Goal: Risk for activity intolerance will decrease Outcome: Progressing   Problem: Nutrition: Goal: Adequate nutrition will be maintained Outcome: Progressing   Problem: Coping: Goal: Level of anxiety will decrease Outcome: Progressing   Problem: Elimination: Goal: Will not experience complications related to bowel motility Outcome: Progressing Goal: Will not experience complications related to urinary retention Outcome: Progressing   Problem: Pain Managment: Goal: General experience of comfort will improve Outcome: Progressing   Problem: Safety: Goal: Ability to remain free from injury will improve Outcome: Progressing   Problem: Skin Integrity: Goal: Risk for impaired skin integrity will decrease Outcome: Progressing   Problem: Activity: Goal: Ability to tolerate increased activity will improve Outcome: Progressing   Problem: Clinical  Measurements: Goal: Ability to maintain a body temperature in the normal range will improve Outcome: Progressing   Problem: Respiratory: Goal: Ability to maintain adequate ventilation will improve Outcome: Progressing Goal: Ability to maintain a clear airway will improve Outcome: Progressing

## 2022-11-02 NOTE — Consult Note (Addendum)
Reason for Consult:AKI Referring Physician: Osvaldo Shipper, MD  Assessment/Plan: #AKI on CKD IV Creatinine today is 3.2 recent baseline seems to be around 2.8-3.0 since July of last year to this year.  Likely pre-renal. Has been receiving significant diuresis for LE swelling. Respiratory status improved and patient remains on room air. Not too off from her baselin - okay to continue diuresis for work of breathing and follow closely. Home lasix is 80 AM and 40 PM.  -IV Lasix 80 BID -CTM  #Lower extremity edema  Diastolic HF  Moderate PAH Echocardiogram with normal ejection fraction of 60 to 65%, G2DD, left atrium dilation, moderate elevation in PA systolic pressure. Has been increased to lasix 80 BID by primary. Per chart review does have history of venous insufficiency when seen by Cardiology which could be contributing as well. Last visit 06/23/22 they recommended leg elevation, salt restriction, walk regularly, compression hose, weight loss and she also had 2-3+ pitting edema at that time. DVT u/s obtain with prelim results that are negative. Amlodipine could be contributing as well. Does have mild work of breathing on room air. -Continue lasix as above -ACE wrap LE to help with compression  #HTN Elevated to 160s-180s. On amlodipine, coreg, hydralazine TID. Possibly in setting of steroid use/illness.  -increase coreg from 6.25 daily to BID  #Multifocal pneumonia  Parainflueza On levaquin q48hrs. Prednisone course for possible asthma component.  -primary managing  #Anemia Hgb 7.9 today baseline around 9-10. Receives aranesp outpatient (6/17) and follows with hematology -Monitor    HPI: [name] is an 89 y.o. female   Hospital course: Admitted on 6/18 for shortness of breath.  Found to have multifocal pneumonia on chest x-ray.  BNP was elevated to 400.3, was started on aztreonam, metronidazole and vancomycin. Echocardiogram with normal ejection fraction of 60 to 65%, G2DD,  left atrium dilation.  Also had hypertensive urgency with blood pressures elevated up to SBP's of 200s.  Has been having persistent LE swelling and was given lasix 40 on 6/18, lasix 120 mg on 6/19 and 80 BID scheduled today.  Creatinine on admission was 3.02. Recent baseline in 2023 and 2024 around 2.8-2.9. Creatinine today is 3.20.  Chemistry and CBC: Creatinine  Date/Time Value Ref Range Status  09/13/2022 12:00 AM 3.1 (A) 0.5 - 1.1 Final  11/16/2021 12:00 AM 2.8 (A) 0.5 - 1.1 Final  11/04/2020 12:00 AM 2.4 (A) 0.5 - 1.1 Final  07/01/2020 12:00 AM 2.3 (A) 0.5 - 1.1 Final  11/13/2019 12:00 AM 2.5 (A) 0.5 - 1.1 Final  10/02/2018 12:00 AM 1.9 (A) 0.5 - 1.1 Final  05/29/2018 12:00 AM 1.9 (A) 0.5 - 1.1 Final  12/03/2017 12:00 AM 2.1 (A) 0.5 - 1.1 Final  06/19/2016 01:01 PM 1.4 (H) 0.6 - 1.1 mg/dL Final  81/19/1478 29:56 AM 1.6 (H) 0.6 - 1.1 mg/dL Final  21/30/8657 84:69 AM 1.5 (H) 0.6 - 1.1 mg/dL Final  62/95/2841 32:44 AM 1.4 (H) 0.6 - 1.1 mg/dL Final  05/17/7251 66:44 PM 1.7 (H) 0.6 - 1.1 mg/dL Final  03/47/4259 56:38 PM 1.7 (H) 0.6 - 1.1 mg/dL Final  75/64/3329 51:88 AM 1.8 (H) 0.6 - 1.1 mg/dL Final  41/66/0630 16:01 AM 1.3 (H) 0.6 - 1.1 mg/dL Final  09/32/3557 32:20 PM 1.3 (H) 0.6 - 1.1 mg/dL Final   Creat  Date/Time Value Ref Range Status  03/10/2020 11:49 AM 2.31 (H) 0.60 - 0.88 mg/dL Final    Comment:    For patients >74 years of age, the reference  limit for Creatinine is approximately 13% higher for people identified as African-American. .    Creatinine, Ser  Date/Time Value Ref Range Status  11/02/2022 03:31 AM 3.20 (H) 0.44 - 1.00 mg/dL Final  16/02/9603 54:09 AM 2.95 (H) 0.44 - 1.00 mg/dL Final  81/19/1478 29:56 AM 2.99 (H) 0.44 - 1.00 mg/dL Final  21/30/8657 84:69 AM 3.02 (H) 0.44 - 1.00 mg/dL Final  62/95/2841 32:44 PM 2.94 (H) 0.40 - 1.20 mg/dL Final  05/17/7251 66:44 AM 2.34 (H) 0.44 - 1.00 mg/dL Final  03/47/4259 56:38 PM 2.38 (H) 0.44 - 1.00 mg/dL Final   75/64/3329 51:88 PM 2.27 (H) 0.40 - 1.20 mg/dL Final  41/66/0630 16:01 PM 2.28 (H) 0.44 - 1.00 mg/dL Final  09/32/3557 32:20 PM 2.30 (H) 0.44 - 1.00 mg/dL Final  25/42/7062 37:62 PM 2.37 (H) 0.44 - 1.00 mg/dL Final  83/15/1761 60:73 PM 2.29 (H) 0.44 - 1.00 mg/dL Final  71/10/2692 85:46 PM 2.35 (H) 0.44 - 1.00 mg/dL Final  27/07/5007 38:18 AM 2.26 (H) 0.44 - 1.00 mg/dL Final  29/93/7169 67:89 AM 2.64 (H) 0.40 - 1.20 mg/dL Final  38/02/1750 02:58 PM 2.45 (H) 0.40 - 1.20 mg/dL Final  52/77/8242 35:36 AM 2.34 (H) 0.40 - 1.20 mg/dL Final  14/43/1540 08:67 AM 2.57 (H) 0.40 - 1.20 mg/dL Final  61/95/0932 67:12 PM 2.97 (H) 0.40 - 1.20 mg/dL Final  45/80/9983 38:25 PM 3.06 (H) 0.40 - 1.20 mg/dL Final  05/39/7673 41:93 AM 1.56 (H) 0.40 - 1.20 mg/dL Final  79/06/4095 35:32 AM 1.67 (H) 0.44 - 1.00 mg/dL Final  99/24/2683 41:96 AM 1.56 (H) 0.44 - 1.00 mg/dL Final  22/29/7989 21:19 PM 1.73 (H) 0.44 - 1.00 mg/dL Final  41/74/0814 48:18 AM 2.16 (H) 0.40 - 1.20 mg/dL Final  56/31/4970 26:37 PM 1.88 (H) 0.44 - 1.00 mg/dL Final  85/88/5027 74:12 PM 2.23 (H) 0.40 - 1.20 mg/dL Final  87/86/7672 09:47 AM 2.20 (H) 0.40 - 1.20 mg/dL Final  09/62/8366 29:47 PM 1.86 (H) 0.40 - 1.20 mg/dL Final  65/46/5035 46:56 PM 1.85 (H) 0.40 - 1.20 mg/dL Final  81/27/5170 01:74 AM 1.51 (H) 0.40 - 1.20 mg/dL Final  94/49/6759 16:38 AM 1.63 (H) 0.40 - 1.20 mg/dL Final  46/65/9935 70:17 AM 1.66 (H) 0.40 - 1.20 mg/dL Final  79/39/0300 92:33 AM 1.34 (H) 0.40 - 1.20 mg/dL Final  00/76/2263 33:54 PM 1.56 (H) 0.40 - 1.20 mg/dL Final  56/25/6389 37:34 AM 1.21 (H) 0.40 - 1.20 mg/dL Final  28/76/8115 72:62 AM 1.42 (H) 0.40 - 1.20 mg/dL Final  03/55/9741 63:84 AM 1.45 (H) 0.40 - 1.20 mg/dL Final  53/64/6803 21:22 AM 1.60 (H) 0.40 - 1.20 mg/dL Final  48/25/0037 04:88 AM 1.61 (H) 0.40 - 1.20 mg/dL Final  89/16/9450 38:88 AM 1.8 (H) 0.4 - 1.2 mg/dL Final  28/00/3491 79:15 AM 1.5 (H) 0.4 - 1.2 mg/dL Final  05/69/7948 01:65 AM 1.7 (H)  0.4 - 1.2 mg/dL Final  53/74/8270 78:67 AM 1.6 (H) 0.4 - 1.2 mg/dL Final  54/49/2010 07:12 AM 1.50 (H) 0.50 - 1.10 mg/dL Final  19/75/8832 54:98 AM 1.5 (H) 0.4 - 1.2 mg/dL Final  26/41/5830 94:07 PM 1.4 (H) 0.4 - 1.2 mg/dL Final  68/12/8108 31:59 PM 1.2 0.4 - 1.2 mg/dL Final  45/85/9292 44:62 AM 1.4 (H) 0.4 - 1.2 mg/dL Final  86/38/1771 16:57 PM 1.4 (H) 0.4 - 1.2 mg/dL Final  90/38/3338 32:91 PM 1.33 (H) 0.50 - 1.10 mg/dL Final  91/66/0600 45:99 AM 1.38 (H) 0.4 - 1.2 mg/dL Final   Recent  Labs  Lab 10/31/22 0149 10/31/22 0427 11/01/22 0305 11/02/22 0331  NA 139 139 136 137  K 4.4 4.4 4.5 4.4  CL 108 108 107 107  CO2 20* 19* 19* 19*  GLUCOSE 127* 123* 136* 143*  BUN 58* 56* 54* 53*  CREATININE 3.02* 2.99* 2.95* 3.20*  CALCIUM 7.9* 7.7* 7.7* 7.9*  PHOS  --  3.8  --   --    Recent Labs  Lab 10/30/22 1235 10/31/22 0149 10/31/22 0427 11/01/22 0305 11/02/22 0331  WBC 5.7 11.9* 11.4* 11.6* 10.9*  NEUTROABS 3.9 10.0* 9.7*  --   --   HGB 8.7* 9.2* 8.3* 8.3* 7.9*  HCT 28.0* 30.5* 26.8* 26.1* 25.3*  MCV 93.6 99.0 94.4 94.2 94.4  PLT 181 169 172 177 178   Liver Function Tests: Recent Labs  Lab 10/31/22 0149 10/31/22 0427  AST 16 15  ALT 9 8  ALKPHOS 45 39  BILITOT 0.6 0.5  PROT 5.5* 5.0*  ALBUMIN 2.5* 2.3*   No results for input(s): "LIPASE", "AMYLASE" in the last 168 hours. No results for input(s): "AMMONIA" in the last 168 hours. Cardiac Enzymes: No results for input(s): "CKTOTAL", "CKMB", "CKMBINDEX", "TROPONINI" in the last 168 hours. Iron Studies: No results for input(s): "IRON", "TIBC", "TRANSFERRIN", "FERRITIN" in the last 72 hours. PT/INR: @LABRCNTIP (inr:5)  Xrays/Other Studies: ) Results for orders placed or performed during the hospital encounter of 10/30/22 (from the past 48 hour(s))  Type and screen [facility]     Status: None   Collection Time: 11/01/22  3:04 AM  Result Value Ref Range   ABO/RH(D) B POS    Antibody Screen NEG     Sample Expiration      11/04/2022,2359 Performed at [facility], [address], [place], [place] [address]   CBC     Status: Abnormal   Collection Time: 11/01/22  3:05 AM  Result Value Ref Range   WBC 11.6 (H) 4.0 - 10.5 K/uL   RBC 2.77 (L) 3.87 - 5.11 MIL/uL   Hemoglobin 8.3 (L) 12.0 - 15.0 g/dL   HCT 13.0 (L) 86.5 - 78.4 %   MCV 94.2 80.0 - 100.0 fL   MCH 30.0 26.0 - 34.0 pg   MCHC 31.8 30.0 - 36.0 g/dL   RDW 69.6 29.5 - 28.4 %   Platelets 177 150 - 400 K/uL   nRBC 0.0 0.0 - 0.2 %    Comment: Performed at [facility], [address] [place], [place] [address]  Basic metabolic panel     Status: Abnormal   Collection Time: 11/01/22  3:05 AM  Result Value Ref Range   Sodium 136 135 - 145 mmol/L   Potassium 4.5 3.5 - 5.1 mmol/L   Chloride 107 98 - 111 mmol/L   CO2 19 (L) 22 - 32 mmol/L   Glucose, Bld 136 (H) 70 - 99 mg/dL    Comment: Glucose reference range applies only to samples taken after fasting for at least 8 hours.   BUN 54 (H) 8 - 23 mg/dL   Creatinine, Ser 0.10 (H) 0.44 - 1.00 mg/dL   Calcium 7.7 (L) 8.9 - 10.3 mg/dL   GFR, Estimated 15 (L) >60 mL/min    Comment: (NOTE) Calculated using the CKD-EPI Creatinine Equation (2021)    Anion gap 10 5 - 15    Comment: Performed at [facility], [address] [place], [place] [address]  Magnesium     Status: None  Collection Time: 11/01/22  3:05 AM  Result Value Ref Range   Magnesium 1.8 1.7 - 2.4 mg/dL    Comment: Performed at [facility], [address] [place], [place] [address]  Respiratory (~20 pathogens) panel by PCR     Status: Abnormal   Collection Time: 11/01/22  2:27 PM   Specimen: Nasopharyngeal Swab; Respiratory  Result Value Ref Range   Adenovirus NOT DETECTED NOT DETECTED   Coronavirus 229E NOT DETECTED NOT DETECTED    Comment: (NOTE) The Coronavirus on the Respiratory Panel, DOES NOT test for the novel  Coronavirus (2019 nCoV)    Coronavirus HKU1 NOT DETECTED NOT DETECTED    Coronavirus NL63 NOT DETECTED NOT DETECTED   Coronavirus OC43 NOT DETECTED NOT DETECTED   Metapneumovirus NOT DETECTED NOT DETECTED   Rhinovirus / Enterovirus NOT DETECTED NOT DETECTED   Influenza A NOT DETECTED NOT DETECTED   Influenza B NOT DETECTED NOT DETECTED   Parainfluenza Virus 1 NOT DETECTED NOT DETECTED   Parainfluenza Virus 2 NOT DETECTED NOT DETECTED   Parainfluenza Virus 3 DETECTED (A) NOT DETECTED   Parainfluenza Virus 4 NOT DETECTED NOT DETECTED   Respiratory Syncytial Virus NOT DETECTED NOT DETECTED   Bordetella pertussis NOT DETECTED NOT DETECTED   Bordetella Parapertussis NOT DETECTED NOT DETECTED   Chlamydophila pneumoniae NOT DETECTED NOT DETECTED   Mycoplasma pneumoniae NOT DETECTED NOT DETECTED    Comment: Performed at [facility], [address], [place], [place] [address]  CBC     Status: Abnormal   Collection Time: 11/02/22  3:31 AM  Result Value Ref Range   WBC 10.9 (H) 4.0 - 10.5 K/uL   RBC 2.68 (L) 3.87 - 5.11 MIL/uL   Hemoglobin 7.9 (L) 12.0 - 15.0 g/dL   HCT 52.8 (L) 41.3 - 24.4 %   MCV 94.4 80.0 - 100.0 fL   MCH 29.5 26.0 - 34.0 pg   MCHC 31.2 30.0 - 36.0 g/dL   RDW 01.0 27.2 - 53.6 %   Platelets 178 150 - 400 K/uL   nRBC 0.0 0.0 - 0.2 %    Comment: Performed at [facility], [address], [place], [place] [address]  Basic metabolic panel     Status: Abnormal   Collection Time: 11/02/22  3:31 AM  Result Value Ref Range   Sodium 137 135 - 145 mmol/L   Potassium 4.4 3.5 - 5.1 mmol/L   Chloride 107 98 - 111 mmol/L   CO2 19 (L) 22 - 32 mmol/L   Glucose, Bld 143 (H) 70 - 99 mg/dL    Comment: Glucose reference range applies only to samples taken after fasting for at least 8 hours.   BUN 53 (H) 8 - 23 mg/dL   Creatinine, Ser 4.74 (H) 0.44 - 1.00 mg/dL   Calcium 7.9 (L) 8.9 - 10.3 mg/dL   GFR, Estimated 14 (L) >60 mL/min    Comment: (NOTE) Calculated using the CKD-EPI Creatinine Equation (2021)    Anion gap 11 5 - 15    Comment:  Performed at [facility], [address] [place], [place] [address]   *Note: Due to a large number of results and/or encounters for the requested time period, some results have not been displayed. A complete set of results can be found in Results Review.   DG CHEST PORT 1 VIEW  Result Date: 11/02/2022 CLINICAL DATA:  Pneumonia EXAM: PORTABLE CHEST 1 VIEW COMPARISON:  10/31/2022 x-ray FINDINGS: Persistent consolidative opacity  in the right upper lobe. Hyperinflation with chronic interstitial changes. No pneumothorax, effusion or edema. Enlarged cardiopericardial silhouette. IMPRESSION: Persistent right upper lobe opacity.  Recommend continued follow-up. Electronically Signed   By: Karen Kays M.D.   On: 11/02/2022 12:41   ECHOCARDIOGRAM COMPLETE  Result Date: 10/31/2022    ECHOCARDIOGRAM REPORT   Patient Name:   [name] Date of Exam: 10/31/2022 Medical Rec #:  [id number]          Height:       60.0 in Accession #:    [id number]         Weight:       157.0 lb Date of Birth:  [birth year]           BSA:          1.684 m Patient Age:    88 years           BP:           186/88 mmHg Patient Gender: F                  HR:           81 bpm. Exam Location:  Inpatient Procedure: 2D Echo, Cardiac Doppler and Color Doppler Indications:    CHF  History:        Patient has prior history of Echocardiogram examinations, most                 recent 11/14/2018. Risk Factors:Hypertension.  Sonographer:    Darlys Gales Referring Phys: [id number] RONDELL A SMITH IMPRESSIONS  1. Left ventricular ejection fraction, by estimation, is 60 to 65%. The left ventricle has normal function. The left ventricle has no regional wall motion abnormalities. There is moderate concentric left ventricular hypertrophy. Left ventricular diastolic parameters are consistent with Grade II diastolic dysfunction (pseudonormalization). Elevated left atrial pressure.  2. Right ventricular systolic function is normal. The right ventricular size  is mildly enlarged. There is moderately elevated pulmonary artery systolic pressure. The estimated right ventricular systolic pressure is 49.0 mmHg.  3. Left atrial size was severely dilated.  4. The mitral valve is normal in structure. Mild mitral valve regurgitation.  5. The aortic valve is tricuspid. There is mild calcification of the aortic valve. There is mild thickening of the aortic valve. Aortic valve regurgitation is not visualized. Aortic valve sclerosis is present, with no evidence of aortic valve stenosis.  6. The inferior vena cava is dilated in size with >50% respiratory variability, suggesting right atrial pressure of 8 mmHg.  7. Cannot exclude a small PFO. Comparison(s): No significant change from prior study. Prior images reviewed side by side. FINDINGS  Left Ventricle: Left ventricular ejection fraction, by estimation, is 60 to 65%. The left ventricle has normal function. The left ventricle has no regional wall motion abnormalities. The left ventricular internal cavity size was normal in size. There is  moderate concentric left ventricular hypertrophy. Left ventricular diastolic parameters are consistent with Grade II diastolic dysfunction (pseudonormalization). Elevated left atrial pressure. Right Ventricle: The right ventricular size is mildly enlarged. No increase in right ventricular wall thickness. Right ventricular systolic function is normal. There is moderately elevated pulmonary artery systolic pressure. The tricuspid regurgitant velocity is 3.20 m/s, and with an assumed right atrial pressure of 8 mmHg, the estimated right ventricular systolic pressure is 49.0 mmHg. Left Atrium: Left atrial size was severely dilated. Right Atrium: Right atrial size was normal in size. Pericardium: There is no evidence of  pericardial effusion. Mitral Valve: The mitral valve is normal in structure. Mild mitral annular calcification. Mild mitral valve regurgitation, with centrally-directed jet. Tricuspid  Valve: The tricuspid valve is normal in structure. Tricuspid valve regurgitation is mild. Aortic Valve: The aortic valve is tricuspid. There is mild calcification of the aortic valve. There is mild thickening of the aortic valve. Aortic valve regurgitation is not visualized. Aortic valve sclerosis is present, with no evidence of aortic valve stenosis. Aortic valve mean gradient measures 6.0 mmHg. Aortic valve peak gradient measures 9.9 mmHg. Pulmonic Valve: The pulmonic valve was grossly normal. Pulmonic valve regurgitation is not visualized. No evidence of pulmonic stenosis. Aorta: The aortic root and ascending aorta are structurally normal, with no evidence of dilitation. Venous: The inferior vena cava is dilated in size with greater than 50% respiratory variability, suggesting right atrial pressure of 8 mmHg. IAS/Shunts: The interatrial septum is aneurysmal. There is right bowing of the interatrial septum, suggestive of elevated left atrial pressure. Cannot exclude a small PFO.  LEFT VENTRICLE PLAX 2D LVIDd:         4.80 cm Diastology LVIDs:         3.50 cm LV e' medial:    6.42 cm/s LV PW:         1.60 cm LV E/e' medial:  18.2 LV IVS:        1.30 cm LV e' lateral:   6.85 cm/s                        LV E/e' lateral: 17.1  RIGHT VENTRICLE             IVC RV S prime:     12.30 cm/s  IVC diam: 2.70 cm TAPSE (M-mode): 3.2 cm LEFT ATRIUM             Index        RIGHT ATRIUM           Index LA Vol (A2C):   74.6 ml 44.30 ml/m  RA Area:     13.90 cm LA Vol (A4C):   60.9 ml 36.16 ml/m  RA Volume:   29.90 ml  17.75 ml/m LA Biplane Vol: 72.4 ml 42.99 ml/m  AORTIC VALVE AV Vmax:           157.00 cm/s AV Vmean:          114.000 cm/s AV VTI:            0.349 m AV Peak Grad:      9.9 mmHg AV Mean Grad:      6.0 mmHg LVOT Vmax:         112.00 cm/s LVOT Vmean:        75.600 cm/s LVOT VTI:          0.262 m LVOT/AV VTI ratio: 0.75  AORTA Ao Root diam: 2.70 cm Ao Asc diam:  2.30 cm MITRAL VALVE                TRICUSPID VALVE  MV Area (PHT): 4.12 cm     TR Peak grad:   41.0 mmHg MV Decel Time: 184 msec     TR Vmax:        320.00 cm/s MV E velocity: 117.00 cm/s MV A velocity: 114.00 cm/s  SHUNTS MV E/A ratio:  1.03         Systemic VTI: 0.26 m Rachelle Hora Croitoru MD Electronically signed by Thurmon Fair MD Signature Date/Time: 10/31/2022/2:45:06 PM  Final     PMH:   Past Medical History:  Diagnosis Date   Acute superficial venous thrombosis of left lower extremity 03/27/2014   concern about hx and potential extensive on exam tenderness calf and low dose lovenox 40 qd 2-4 weekselevetion and close  fu advised .    Allergy    Anemia    Anxiety    Arthritis    "shoulders" (09/09/2012)   Asthma 09/08/2012   Carotid bruit    [place] 2018  low risk 1 - 39%   Cataract    bil cateracts removed   Chronic kidney disease    Clotting disorder (HCC)    blood clots in legs   Diabetes mellitus without complication (HCC)    "Borderline" per pt; being monitored.  no meds per pt   Diverticulosis    Dyspnea 04/27/2014   GERD (gastroesophageal reflux disease)    Headache(784.0)    "related to my high blood pressure" (09/09/2012)   Hiatal hernia    History of DVT (deep vein thrombosis)    "RLE" (09/09/2012) coumadine cant take asa so on plavix    HTN (hypertension) 09/08/2012   Hyperlipidemia    Lupus (HCC)    "cured years ago" (09/09/2012)   Other dysphagia 02/11/2013   Syncope and collapse 01/21/2018   Varicose veins    Varicose veins of leg with complications 12/05/2010    PSH:   Past Surgical History:  Procedure Laterality Date   ABDOMINAL HYSTERECTOMY     partial   BREAST EXCISIONAL BIOPSY Right    BREAST SURGERY     CARPAL TUNNEL RELEASE Right 2000's   CARPAL TUNNEL RELEASE Bilateral 10/21/2013   Procedure: BILATERAL  CARPAL TUNNEL RELEASE;  Surgeon: Nicki Reaper, MD;  Location: [facility];  Service: Orthopedics;  Laterality: Bilateral;   COLONOSCOPY     SHOULDER OPEN ROTATOR CUFF REPAIR Bilateral  2000's   TRIGGER FINGER RELEASE Right 10/21/2013   Procedure: RELEASE TRIGGER FINGER/A-1 PULLEY RIGHT MIDDLE AND RIGHT RING;  Surgeon: Nicki Reaper, MD;  Location: [facility];  Service: Orthopedics;  Laterality: Right;   UPPER GASTROINTESTINAL ENDOSCOPY      Allergies:  Allergies  Allergen Reactions   Penicillins Nausea And Vomiting and Other (See Comments)    Has patient had a PCN reaction causing immediate rash, facial/tongue/throat swelling, SOB or lightheadedness with hypotension: Y Has patient had a PCN reaction causing severe rash involving mucus membranes or skin necrosis: Y Has patient had a PCN reaction that required hospitalization: N Has patient had a PCN reaction occurring within the last 10 years: N If all of the above answers are "NO", then may proceed with Cephalosporin use.    Aspirin Other (See Comments)    Wheezing Acetaminophen is OK     Medications:   Prior to Admission medications   Medication Sig Start Date End Date Taking? Authorizing Provider  acetaminophen (TYLENOL) 500 MG tablet Take 500 mg by mouth in the morning and at bedtime.   Yes [provider]  albuterol (PROVENTIL) (2.5 MG/3ML) 0.083% nebulizer solution Take 3 mLs (2.5 mg total) by nebulization every 6 (six) hours as needed for shortness of breath or wheezing. Patient taking differently: Take 2.5 mg by nebulization See admin instructions. Nebulize 2.5 mg and inhale into the lungs in the morning and at bedtime- may use an additional 2 times a day as needed for shortness of breath or wheezing 08/03/22  Yes Carlisle Beers, FNP  albuterol (  VENTOLIN HFA) 108 (90 Base) MCG/ACT inhaler USE 2 PUFFS EVERY 6 HOURS AS NEEDED FOR WHEEZING. Patient taking differently: Inhale 2 puffs into the lungs every 6 (six) hours as needed for wheezing or shortness of breath. 08/07/22  Yes Byrum, Les Pou, MD  amLODipine (NORVASC) 5 MG tablet Take 1 tablet (5 mg total) by mouth daily. 03/07/22  Yes  Nahser, Deloris Ping, MD  atorvastatin (LIPITOR) 40 MG tablet Take 1 tablet (40 mg total) by mouth daily. 04/21/22  Yes Nahser, Deloris Ping, MD  atropine 1 % ophthalmic solution Place 1 drop into both eyes 2 (two) times daily.   Yes [provider]  Azelastine-Fluticasone 137-50 MCG/ACT SUSP PLACE ONE SPRAY IN EACH NOSTRIL TWICE DAILY (IN THE MORNING AND AT BEDTIME) Patient taking differently: Place 1 spray into both nostrils in the morning and at bedtime. 01/19/22  Yes Leslye Peer, MD  Budeson-Glycopyrrol-Formoterol (BREZTRI AEROSPHERE) 160-9-4.8 MCG/ACT AERO INHALE 2 PUFFS INTO THE LUNGS TWICE DAILY, IN THE MORNING & AT BEDTIME. Patient taking differently: Inhale 2 puffs into the lungs in the morning and at bedtime. 06/22/22  Yes Leslye Peer, MD  carvedilol (COREG) 6.25 MG tablet Take 6.25 mg by mouth in the morning.   Yes [provider]  cetirizine (ZYRTEC) 10 MG tablet Take 10 mg by mouth daily as needed for allergies or rhinitis.   Yes [provider]  citalopram (CELEXA) 20 MG tablet Take 1 tablet (20 mg total) by mouth every evening. 09/12/22  Yes Panosh, Neta Mends, MD  clopidogrel (PLAVIX) 75 MG tablet TAKE ONE TABLET BY MOUTH ONCE DAILY. Patient taking differently: Take 75 mg by mouth in the morning. 10/19/22  Yes Panosh, Neta Mends, MD  diclofenac Sodium (VOLTAREN) 1 % GEL Apply 4 g topically 2 (two) times daily as needed (painful areas). 05/02/22  Yes Ellsworth Lennox, PA-C  doxazosin (CARDURA) 2 MG tablet TAKE THREE TABLETS BY MOUTH AT BEDTIME 08/22/22  Yes Panosh, Neta Mends, MD  ferrous sulfate 325 (65 FE) MG EC tablet Take 1 tablet (325 mg total) by mouth daily with breakfast. 12/15/14  Yes Panosh, Neta Mends, MD  fluticasone (FLONASE) 50 MCG/ACT nasal spray Place 2 sprays into both nostrils daily. Patient taking differently: Place 2 sprays into both nostrils daily as needed for allergies or rhinitis. 10/20/19  Yes Leslye Peer, MD  furosemide (LASIX) 40 MG tablet Take 40-80 mg by  mouth See admin instructions. Take 80 mg by mouth in the morning and 40 mg in the early evening   Yes [provider]  hydrALAZINE (APRESOLINE) 50 MG tablet Take 1&1/2 tablets (75 mg total) by mouth 3 (three) times daily. Patient taking differently: Take 75 mg by mouth 3 (three) times daily. 10/24/22  Yes Nahser, Deloris Ping, MD  LORazepam (ATIVAN) 0.5 MG tablet Take 1 tablet (0.5 mg total) by mouth 2 (two) times daily as needed. for anxiety 09/17/22  Yes Panosh, Neta Mends, MD  montelukast (SINGULAIR) 10 MG tablet TAKE ONE TABLET BY MOUTH ONCE DAILY Patient taking differently: Take 10 mg by mouth at bedtime. 08/06/22  Yes Worthy Rancher B, FNP  ondansetron (ZOFRAN-ODT) 8 MG disintegrating tablet 8mg  ODT q4 hours prn nausea Patient taking differently: Take 8 mg by mouth every 4 (four) hours as needed for nausea (DISSOLVE ORALLY). 09/12/21  Yes Dione Booze, MD  pantoprazole (PROTONIX) 40 MG tablet TAKE ONE TABLET BY MOUTH TWICE DAILY Patient taking differently: Take 40 mg by mouth 2 (two) times daily before a meal.  08/22/22  Yes Panosh, Neta Mends, MD  potassium chloride (KLOR-CON) 10 MEQ tablet TAKE FOUR TABLETS BY MOUTH DAILY. Patient taking differently: Take 20 mEq by mouth 2 (two) times daily. 10/19/22  Yes Panosh, Neta Mends, MD  RESTASIS 0.05 % ophthalmic emulsion Place 1 drop into both eyes 2 (two) times daily. 10/13/21  Yes [provider]  vitamin B-12 (CYANOCOBALAMIN) 1000 MCG tablet Take 1,000 mcg by mouth daily.   Yes [provider]  Vitamin D, Cholecalciferol, 25 MCG (1000 UT) TABS Take 1,000 Units by mouth daily.   Yes [provider]  azithromycin (ZITHROMAX) 250 MG tablet Take 1 tablet (250 mg total) by mouth daily. Take first 2 tablets together, then 1 every day until finished. Patient not taking: Reported on 10/31/2022 10/21/22   Valinda Hoar, NP  cloNIDine (CATAPRES) 0.1 MG tablet Take 1 tablet every 12 hours as needed for blood pressures greater than 180  systolic Patient not taking: Reported on 10/31/2022 09/10/21   Geoffery Lyons, MD  Spacer/Aero-Holding Chambers (AEROCHAMBER PLUS) inhaler Use as instructed 05/26/16   Domenick Gong, MD    Discontinued Meds:   Medications Discontinued During This Encounter  Medication Reason   vancomycin (VANCOCIN) IVPB 1000 mg/200 mL premix    albuterol (VENTOLIN HFA) 108 (90 Base) MCG/ACT inhaler 2 puff    lactated ringers infusion    azaTHIOprine (IMURAN) 50 MG tablet Patient Preference   furosemide (LASIX) injection 40 mg    aztreonam (AZACTAM) 1 g in sodium chloride 0.9 % 100 mL IVPB    ipratropium-albuterol (DUONEB) 0.5-2.5 (3) MG/3ML nebulizer solution 3 mL     Social History:  reports that she has never smoked. She has never used smokeless tobacco. She reports that she does not drink alcohol and does not use drugs.  Family History:   Family History  Problem Relation Age of Onset   Hypertension Mother    Lung cancer Mother    Hypertension Father    Lung cancer Father    Breast cancer Sister    Colon cancer Sister        ? 29' s dx - died in 60's   Breast cancer Sister    Hypertension Brother    Rectal cancer Brother    Stomach cancer Brother    Breast cancer Brother    Heart attack Daughter    Esophageal cancer Daughter    Hypertension Daughter    Diabetes Daughter    Breast cancer Paternal Aunt    Lung cancer Other        both parents    Pancreatic cancer Neg Hx     Blood pressure (!) 170/61, pulse 76, temperature 98 F (36.7 C), temperature source Oral, resp. rate 16, weight 71.2 kg, SpO2 98 %.  General:NAD, awake, alert, responsive to questions Head: Normocephalic atraumatic CV: Regular rate and rhythm no murmurs rubs or gallops Respiratory: mild increased work of breathing on RA (no retractions but taking some deeper breaths), diminished breath sounds in anterior lung fields, speaking full sentences, productive cough Abdomen: Soft, non-tender Extremities: Moves upper and  lower extremities freely, 2+ pitting LE edema to knees b/l  Levin Erp, MD 11/02/2022, 1:06 PM      Seen and examined independently.  Agree with note and exam as documented above by resident physician Dr. Laroy Apple and as noted here.  [name] is an 89 year old female with a history of CKD stage IV, DM type 2, and HTN followed by Dr. [name] in our office  who presented to the hospital with shortness of breath.  She was found to have hypertensive urgency with BP in the 200's systolic.  She was also found to have multifocal pneumonia and parainfluenza.  TTE demonstrated diastolic dysfunction. She has been on lasix 80 mg IV BID most recently.  She could hear herself wheezing earlier and this has gotten better.  Her granddaughter stays with her and helps with her medications and joined [place] by phone.  Several of her children are at bedside.    General elderly female in bed in no acute distress HEENT normocephalic atraumatic extraocular movements intact sclera anicteric Neck supple trachea midline Lungs clear but reduced to auscultation bilaterally; increased work of breathing with exertion and unlabored at rest Heart S1S2 no rub Abdomen soft nontender nondistended Extremities 2+ edema lower extremities Psych normal mood and affect Neuro - alert and oriented x 3 provides history and follows commands   # Hypertensive urgency - Will increase Coreg to twice daily dosing - Continue other regimen   # Community acquired pneumonia/Parainfluenza  - Therapies per primary team  # CKD stage IV - Baseline creatinine is 3 - Follows with Dr. [name] at [facility]  # Heart failure with preserved EF  - Continue Lasix twice daily dosing - assess for timing of transition to oral regimen.  She reports home regimen is lasix 80 mg in the AM and 40 mg in the afternoon - Wrap legs to optimize diuresis   # Pulmonary artery hypertension - Diuresis as above  # Anemia CKD - recently rec'd ESA  outpatient on 10/30/22 - hem/onc is dosing   Would continue inpatient monitoring  Thank you for the consult.  Please do not hesitate to contact me with any questions regarding our patient.    Estanislado Emms, MD 11/02/2022 8:25 PM

## 2022-11-02 NOTE — Progress Notes (Signed)
[facility] PROGRESS NOTE   Leinaala Buckridge ZOX:[id number] DOB: 06-Sep-1933 DOA: 10/30/2022  PCP: [name], MD  Brief History/Interval Summary: 89 y.o. female with medical history significant of essential hypertension, hyperlipidemia, asthma, CKD stage IV, DM type II, DVT, CKD, history of lupus, GERD who presented with complaints of shortness of breath over 2 weeks.  Concern was for multifocal pneumonia.  She was hospitalized for further management.  Consultants: None  Procedures: None    Subjective/Interval History: Continues to have a cough and some wheezing.  Overall she is better compared to admission.  However is still not back to baseline.  Denies any nausea vomiting.     Assessment/Plan:  Community-acquired pneumonia/parainfluenza infection Chest x-ray showed multifocal pneumonia. Procalcitonin 0.19.  Lactic acid level was normal. COVID-19 RSV and influenza PCR negative. Respiratory viral panel positive for parainfluenza. Patient with allergy to penicillin.  Patient was initially started on vancomycin and aztreonam.  MRSA PCR was negative.  WBC was better.  Changed over to levofloxacin. Respiratory status is stable.  Will repeat chest x-ray.  Acute diastolic CHF Patient with 2+ pitting edema bilateral lower extremities.  BNP was noted to be elevated.  Echocardiogram shows normal LVEF. Patient was started on IV furosemide.  Not much improvement noted in lower extremity edema.  Dose of Lasix was increased yesterday.  Creatinine noted to be higher today. Mood ordered lower extremity Doppler studies to rule out DVT. Strict ins and outs and daily weights.  Hypertensive urgency Blood pressure is still poorly controlled.  However will not be too aggressive considering her advanced age and her renal dysfunction.  She is currently on amlodipine carvedilol doxazosin hydralazine.  Steroids could be contributing to elevated blood pressures.  Moderate persistent  asthma Continue with inhalers.  Thought to have asthma exacerbation on admission.  Noted to be on prednisone which will be continued for now.  Will start tapering soon.    Anemia of chronic disease Hemoglobin is low but stable.  No evidence for overt bleeding.  Continue to monitor.  Hypomagnesemia Was repleted.    CKD stage IV Followed by Dr. [name]. Renal function seems to be close to baseline though slight worsening is noted this morning.  Back in 2022 in 2023 she is to have a creatinine of 2.3 or so.  Seems to have worsened in the last 6 months.   Will request nephrology to assist with management considering rising creatinine and persistent lower extremity edema.  Hyperlipidemia Continue statin.  Anxiety Continue with home medications.  Obesity Estimated body mass index is 30.66 kg/m as calculated from the following:   Height as of 06/29/22: 5' (1.524 m).   Weight as of this encounter: 71.2 kg.  DVT Prophylaxis: Subcutaneous heparin Code Status: Full code Family Communication: Discussed with patient and her granddaughter who was at the bedside Disposition Plan: Home when medically stable.  Status is: Inpatient Remains inpatient appropriate because: Acute CHF, community-acquired pneumonia      Medications: Scheduled:  amLODipine  5 mg Oral Daily   arformoterol  15 mcg Nebulization BID   atorvastatin  40 mg Oral Daily   budesonide (PULMICORT) nebulizer solution  0.5 mg Nebulization BID   carvedilol  6.25 mg Oral Daily   citalopram  20 mg Oral QPM   clopidogrel  75 mg Oral q AM   doxazosin  6 mg Oral QHS   furosemide  80 mg Intravenous BID   guaiFENesin  600 mg Oral BID   heparin  5,000  Units Subcutaneous Q8H   hydrALAZINE  75 mg Oral TID   levofloxacin  500 mg Oral Q48H   montelukast  10 mg Oral Daily   pantoprazole  40 mg Oral BID AC   predniSONE  40 mg Oral Q breakfast   sodium chloride flush  3 mL Intravenous Q12H   Continuous:   IHK:[id number] **OR**  acetaminophen, albuterol, hydrALAZINE, LORazepam, melatonin, ondansetron (ZOFRAN) IV  Antibiotics: Anti-infectives (From admission, onward)    Start     Dose/Rate Route Frequency Ordered Stop   11/01/22 1145  levofloxacin (LEVAQUIN) tablet 500 mg        500 mg Oral Every 48 hours 11/01/22 1045 11/07/22 1129   10/31/22 1000  aztreonam (AZACTAM) 1 g in sodium chloride 0.9 % 100 mL IVPB  Status:  Discontinued        1 g 200 mL/hr over 30 Minutes Intravenous Every 8 hours 10/31/22 0255 11/01/22 1029   10/31/22 0200  vancomycin (VANCOREADY) IVPB 1750 mg/350 mL        1,750 mg 175 mL/hr over 120 Minutes Intravenous  Once 10/31/22 0149 10/31/22 0710   10/31/22 0145  aztreonam (AZACTAM) 2 g in sodium chloride 0.9 % 100 mL IVPB        2 g 200 mL/hr over 30 Minutes Intravenous  Once 10/31/22 0138 10/31/22 0249   10/31/22 0145  metroNIDAZOLE (FLAGYL) IVPB 500 mg        500 mg 100 mL/hr over 60 Minutes Intravenous  Once 10/31/22 0138 10/31/22 0354   10/31/22 0145  vancomycin (VANCOCIN) IVPB 1000 mg/200 mL premix  Status:  Discontinued        1,000 mg 200 mL/hr over 60 Minutes Intravenous  Once 10/31/22 0138 10/31/22 0149       Objective:  Vital Signs  Vitals:   11/01/22 1928 11/01/22 1937 11/02/22 0455 11/02/22 0736  BP:  (!) 171/68 (!) 161/61 (!) 170/61  Pulse:  69 72 76  Resp:  16 14 16   Temp:  98.9 F (37.2 C) 98.2 F (36.8 C) 98 F (36.7 C)  TempSrc:   Oral Oral  SpO2: 98% 100% 99% 98%  Weight:        Intake/Output Summary (Last 24 hours) at 11/02/2022 0940 Last data filed at 11/02/2022 0500 Gross per 24 hour  Intake --  Output 600 ml  Net -600 ml    Filed Weights   10/31/22 0137  Weight: 71.2 kg    General appearance: Awake alert.  In no distress Resp: Tachypneic at rest.  No use of accessory muscles.  Coarse breath sounds bilaterally with crackles at the bases.  No wheezing or rhonchi. Cardio: S1-S2 is normal regular.  No S3-S4.  No rubs murmurs or bruit GI:  Abdomen is soft.  Nontender nondistended.  Bowel sounds are present normal.  No masses organomegaly Extremities: 2+ edema bilateral lower extremities. Neurologic: Alert and oriented x3.  No focal neurological deficits.    Lab Results:  Data Reviewed: I have personally reviewed following labs and reports of the imaging studies  CBC: Recent Labs  Lab 10/30/22 1235 10/31/22 0149 10/31/22 0427 11/01/22 0305 11/02/22 0331  WBC 5.7 11.9* 11.4* 11.6* 10.9*  NEUTROABS 3.9 10.0* 9.7*  --   --   HGB 8.7* 9.2* 8.3* 8.3* 7.9*  HCT 28.0* 30.5* 26.8* 26.1* 25.3*  MCV 93.6 99.0 94.4 94.2 94.4  PLT 181 169 172 177 178     Basic Metabolic Panel: Recent Labs  Lab 10/31/22 0149 10/31/22 0427  11/01/22 0305 11/02/22 0331  NA 139 139 136 137  K 4.4 4.4 4.5 4.4  CL 108 108 107 107  CO2 20* 19* 19* 19*  GLUCOSE 127* 123* 136* 143*  BUN 58* 56* 54* 53*  CREATININE 3.02* 2.99* 2.95* 3.20*  CALCIUM 7.9* 7.7* 7.7* 7.9*  MG  --  1.4* 1.8  --   PHOS  --  3.8  --   --      GFR: Estimated Creatinine Clearance: 11.1 mL/min (A) (by C-G formula based on SCr of 3.2 mg/dL (H)).  Liver Function Tests: Recent Labs  Lab 10/31/22 0149 10/31/22 0427  AST 16 15  ALT 9 8  ALKPHOS 45 39  BILITOT 0.6 0.5  PROT 5.5* 5.0*  ALBUMIN 2.5* 2.3*      Coagulation Profile: Recent Labs  Lab 10/31/22 0149  INR 1.0      Recent Results (from the past 240 hour(s))  Culture, blood (single)     Status: None (Preliminary result)   Collection Time: 10/31/22  1:47 AM   Specimen: BLOOD  Result Value Ref Range Status   Specimen Description BLOOD RIGHT ANTECUBITAL  Final   Special Requests   Final    BOTTLES DRAWN AEROBIC AND ANAEROBIC Blood Culture results may not be optimal due to an excessive volume of blood received in culture bottles   Culture   Final    NO GROWTH 2 DAYS Performed at [facility], [address], [place], [place] [address]    Report Status PENDING  Incomplete  Resp panel  by RT-PCR (RSV, Flu A&B, Covid) Urine, Clean Catch     Status: None   Collection Time: 10/31/22  2:09 AM   Specimen: Urine, Clean Catch; Nasal Swab  Result Value Ref Range Status   SARS Coronavirus 2 by RT PCR NEGATIVE NEGATIVE Final   Influenza A by PCR NEGATIVE NEGATIVE Final   Influenza B by PCR NEGATIVE NEGATIVE Final    Comment: (NOTE) The Xpert Xpress SARS-CoV-2/FLU/RSV plus assay is intended as an aid in the diagnosis of influenza from Nasopharyngeal swab specimens and should not be used as a sole basis for treatment. Nasal washings and aspirates are unacceptable for Xpert Xpress SARS-CoV-2/FLU/RSV testing.  Fact Sheet for Patients: BloggerCourse.com  Fact Sheet for Healthcare Providers: SeriousBroker.it  This test is not yet approved or cleared by the [place] FDA and has been authorized for detection and/or diagnosis of SARS-CoV-2 by FDA under an Emergency Use Authorization (EUA). This EUA will remain in effect (meaning this test can be used) for the duration of the COVID-19 declaration under Section 564(b)(1) of the Act, 21 U.S.C. section 360bbb-3(b)(1), unless the authorization is terminated or revoked.     Resp Syncytial Virus by PCR NEGATIVE NEGATIVE Final    Comment: (NOTE) Fact Sheet for Patients: BloggerCourse.com  Fact Sheet for Healthcare Providers: SeriousBroker.it  This test is not yet approved or cleared by the [place] FDA and has been authorized for detection and/or diagnosis of SARS-CoV-2 by FDA under an Emergency Use Authorization (EUA). This EUA will remain in effect (meaning this test can be used) for the duration of the COVID-19 declaration under Section 564(b)(1) of the Act, 21 U.S.C. section 360bbb-3(b)(1), unless the authorization is terminated or revoked.  Performed at [facility], [address], [place], [place] [address]    MRSA Next Gen by PCR, Nasal     Status: None   Collection Time: 10/31/22  2:49 AM   Specimen:  Nasal Mucosa; Nasal Swab  Result Value Ref Range Status   MRSA by PCR Next Gen NOT DETECTED NOT DETECTED Final    Comment: (NOTE) The GeneXpert MRSA Assay (FDA approved for NASAL specimens only), is one component of a comprehensive MRSA colonization surveillance program. It is not intended to diagnose MRSA infection nor to guide or monitor treatment for MRSA infections. Test performance is not FDA approved in patients less than 11 years old. Performed at [facility], [address], [place], [place] [address]   Respiratory (~20 pathogens) panel by PCR     Status: Abnormal   Collection Time: 11/01/22  2:27 PM   Specimen: Nasopharyngeal Swab; Respiratory  Result Value Ref Range Status   Adenovirus NOT DETECTED NOT DETECTED Final   Coronavirus 229E NOT DETECTED NOT DETECTED Final    Comment: (NOTE) The Coronavirus on the Respiratory Panel, DOES NOT test for the novel  Coronavirus (2019 nCoV)    Coronavirus HKU1 NOT DETECTED NOT DETECTED Final   Coronavirus NL63 NOT DETECTED NOT DETECTED Final   Coronavirus OC43 NOT DETECTED NOT DETECTED Final   Metapneumovirus NOT DETECTED NOT DETECTED Final   Rhinovirus / Enterovirus NOT DETECTED NOT DETECTED Final   Influenza A NOT DETECTED NOT DETECTED Final   Influenza B NOT DETECTED NOT DETECTED Final   Parainfluenza Virus 1 NOT DETECTED NOT DETECTED Final   Parainfluenza Virus 2 NOT DETECTED NOT DETECTED Final   Parainfluenza Virus 3 DETECTED (A) NOT DETECTED Final   Parainfluenza Virus 4 NOT DETECTED NOT DETECTED Final   Respiratory Syncytial Virus NOT DETECTED NOT DETECTED Final   Bordetella pertussis NOT DETECTED NOT DETECTED Final   Bordetella Parapertussis NOT DETECTED NOT DETECTED Final   Chlamydophila pneumoniae NOT DETECTED NOT DETECTED Final   Mycoplasma pneumoniae NOT DETECTED NOT DETECTED Final    Comment: Performed at [facility], [address] [place], [place] [address]      Radiology Studies: ECHOCARDIOGRAM COMPLETE  Result Date: 10/31/2022    ECHOCARDIOGRAM REPORT   Patient Name:   BILLIE-JO RIDGLEY [facility] Date of Exam: 10/31/2022 Medical Rec #:  [id number]          Height:       60.0 in Accession #:    [id number]         Weight:       157.0 lb Date of Birth:  May 11, 1934           BSA:          1.684 m Patient Age:    88 years           BP:           186/88 mmHg Patient Gender: F                  HR:           81 bpm. Exam Location:  Inpatient Procedure: 2D Echo, Cardiac Doppler and Color Doppler Indications:    CHF  History:        Patient has prior history of Echocardiogram examinations, most                 recent 11/14/2018. Risk Factors:Hypertension.  Sonographer:    Darlys Gales Referring Phys: [id number] RONDELL A SMITH IMPRESSIONS  1. Left ventricular ejection fraction, by estimation, is 60 to 65%. The left ventricle has normal function. The left ventricle has no regional wall motion abnormalities. There is moderate concentric left ventricular hypertrophy. Left  ventricular diastolic parameters are consistent with Grade II diastolic dysfunction (pseudonormalization). Elevated left atrial pressure.  2. Right ventricular systolic function is normal. The right ventricular size is mildly enlarged. There is moderately elevated pulmonary artery systolic pressure. The estimated right ventricular systolic pressure is 49.0 mmHg.  3. Left atrial size was severely dilated.  4. The mitral valve is normal in structure. Mild mitral valve regurgitation.  5. The aortic valve is tricuspid. There is mild calcification of the aortic valve. There is mild thickening of the aortic valve. Aortic valve regurgitation is not visualized. Aortic valve sclerosis is present, with no evidence of aortic valve stenosis.  6. The inferior vena cava is dilated in size with >50% respiratory variability, suggesting right atrial pressure of 8 mmHg.  7. Cannot  exclude a small PFO. Comparison(s): No significant change from prior study. Prior images reviewed side by side. FINDINGS  Left Ventricle: Left ventricular ejection fraction, by estimation, is 60 to 65%. The left ventricle has normal function. The left ventricle has no regional wall motion abnormalities. The left ventricular internal cavity size was normal in size. There is  moderate concentric left ventricular hypertrophy. Left ventricular diastolic parameters are consistent with Grade II diastolic dysfunction (pseudonormalization). Elevated left atrial pressure. Right Ventricle: The right ventricular size is mildly enlarged. No increase in right ventricular wall thickness. Right ventricular systolic function is normal. There is moderately elevated pulmonary artery systolic pressure. The tricuspid regurgitant velocity is 3.20 m/s, and with an assumed right atrial pressure of 8 mmHg, the estimated right ventricular systolic pressure is 49.0 mmHg. Left Atrium: Left atrial size was severely dilated. Right Atrium: Right atrial size was normal in size. Pericardium: There is no evidence of pericardial effusion. Mitral Valve: The mitral valve is normal in structure. Mild mitral annular calcification. Mild mitral valve regurgitation, with centrally-directed jet. Tricuspid Valve: The tricuspid valve is normal in structure. Tricuspid valve regurgitation is mild. Aortic Valve: The aortic valve is tricuspid. There is mild calcification of the aortic valve. There is mild thickening of the aortic valve. Aortic valve regurgitation is not visualized. Aortic valve sclerosis is present, with no evidence of aortic valve stenosis. Aortic valve mean gradient measures 6.0 mmHg. Aortic valve peak gradient measures 9.9 mmHg. Pulmonic Valve: The pulmonic valve was grossly normal. Pulmonic valve regurgitation is not visualized. No evidence of pulmonic stenosis. Aorta: The aortic root and ascending aorta are structurally normal, with no  evidence of dilitation. Venous: The inferior vena cava is dilated in size with greater than 50% respiratory variability, suggesting right atrial pressure of 8 mmHg. IAS/Shunts: The interatrial septum is aneurysmal. There is right bowing of the interatrial septum, suggestive of elevated left atrial pressure. Cannot exclude a small PFO.  LEFT VENTRICLE PLAX 2D LVIDd:         4.80 cm Diastology LVIDs:         3.50 cm LV e' medial:    6.42 cm/s LV PW:         1.60 cm LV E/e' medial:  18.2 LV IVS:        1.30 cm LV e' lateral:   6.85 cm/s                        LV E/e' lateral: 17.1  RIGHT VENTRICLE             IVC RV S prime:     12.30 cm/s  IVC diam: 2.70 cm TAPSE (M-mode): 3.2 cm LEFT ATRIUM  Index        RIGHT ATRIUM           Index LA Vol (A2C):   74.6 ml 44.30 ml/m  RA Area:     13.90 cm LA Vol (A4C):   60.9 ml 36.16 ml/m  RA Volume:   29.90 ml  17.75 ml/m LA Biplane Vol: 72.4 ml 42.99 ml/m  AORTIC VALVE AV Vmax:           157.00 cm/s AV Vmean:          114.000 cm/s AV VTI:            0.349 m AV Peak Grad:      9.9 mmHg AV Mean Grad:      6.0 mmHg LVOT Vmax:         112.00 cm/s LVOT Vmean:        75.600 cm/s LVOT VTI:          0.262 m LVOT/AV VTI ratio: 0.75  AORTA Ao Root diam: 2.70 cm Ao Asc diam:  2.30 cm MITRAL VALVE                TRICUSPID VALVE MV Area (PHT): 4.12 cm     TR Peak grad:   41.0 mmHg MV Decel Time: 184 msec     TR Vmax:        320.00 cm/s MV E velocity: 117.00 cm/s MV A velocity: 114.00 cm/s  SHUNTS MV E/A ratio:  1.03         Systemic VTI: 0.26 m Rachelle Hora Croitoru MD Electronically signed by Thurmon Fair MD Signature Date/Time: 10/31/2022/2:45:06 PM    Final        LOS: 2 days   Osvaldo Shipper  [facility] Pager on [email or web address]  11/02/2022, 9:40 AM

## 2022-11-02 NOTE — Progress Notes (Signed)
Bilateral lower extremity venous duplex has been completed. Preliminary results can be found in CV Proc through chart review.   11/02/22 2:01 PM Olen Cordial RVT

## 2022-11-03 DIAGNOSIS — N179 Acute kidney failure, unspecified: Secondary | ICD-10-CM

## 2022-11-03 DIAGNOSIS — N184 Chronic kidney disease, stage 4 (severe): Secondary | ICD-10-CM | POA: Diagnosis not present

## 2022-11-03 DIAGNOSIS — J189 Pneumonia, unspecified organism: Secondary | ICD-10-CM | POA: Diagnosis not present

## 2022-11-03 DIAGNOSIS — B348 Other viral infections of unspecified site: Secondary | ICD-10-CM

## 2022-11-03 DIAGNOSIS — I5033 Acute on chronic diastolic (congestive) heart failure: Secondary | ICD-10-CM | POA: Diagnosis not present

## 2022-11-03 LAB — BASIC METABOLIC PANEL
Anion gap: 10 (ref 5–15)
BUN: 59 mg/dL — ABNORMAL HIGH (ref 8–23)
CO2: 21 mmol/L — ABNORMAL LOW (ref 22–32)
Calcium: 8.1 mg/dL — ABNORMAL LOW (ref 8.9–10.3)
Chloride: 106 mmol/L (ref 98–111)
Creatinine, Ser: 3.56 mg/dL — ABNORMAL HIGH (ref 0.44–1.00)
GFR, Estimated: 12 mL/min — ABNORMAL LOW (ref >60)
Glucose, Bld: 116 mg/dL — ABNORMAL HIGH (ref 70–99)
Potassium: 4.4 mmol/L (ref 3.5–5.1)
Sodium: 137 mmol/L (ref 135–145)

## 2022-11-03 LAB — IRON AND TIBC
Iron: 47 ug/dL (ref 28–170)
Saturation Ratios: 34 % — ABNORMAL HIGH (ref 10.4–31.8)
TIBC: 139 ug/dL — ABNORMAL LOW (ref 250–450)
UIBC: 92 ug/dL

## 2022-11-03 LAB — RETICULOCYTES
Immature Retic Fract: 21.1 % — ABNORMAL HIGH (ref 2.3–15.9)
RBC.: 2.72 MIL/uL — ABNORMAL LOW (ref 3.87–5.11)
Retic Count, Absolute: 37.5 10*3/uL (ref 19.0–186.0)
Retic Ct Pct: 1.4 % (ref 0.4–3.1)

## 2022-11-03 LAB — FOLATE: Folate: 5.1 ng/mL — ABNORMAL LOW (ref >5.9)

## 2022-11-03 LAB — FERRITIN: Ferritin: 248 ng/mL (ref 11–307)

## 2022-11-03 LAB — VITAMIN B12: Vitamin B-12: 1306 pg/mL — ABNORMAL HIGH (ref 180–914)

## 2022-11-03 LAB — CULTURE, BLOOD (SINGLE)

## 2022-11-03 MED ORDER — FUROSEMIDE 40 MG PO TABS
80.0000 mg | ORAL_TABLET | Freq: Every morning | ORAL | Status: DC
  Administered 2022-11-04 – 2022-11-06 (×3): 80 mg via ORAL
  Filled 2022-11-03 (×3): qty 2

## 2022-11-03 MED ORDER — DARBEPOETIN ALFA 60 MCG/0.3ML IJ SOSY
60.0000 ug | PREFILLED_SYRINGE | Freq: Once | INTRAMUSCULAR | Status: AC
  Administered 2022-11-03: 60 ug via SUBCUTANEOUS
  Filled 2022-11-03: qty 0.3

## 2022-11-03 MED ORDER — SODIUM CHLORIDE 0.9 % IV SOLN
750.0000 mg | Freq: Once | INTRAVENOUS | Status: AC
  Administered 2022-11-03: 750 mg via INTRAVENOUS
  Filled 2022-11-03: qty 15

## 2022-11-03 MED ORDER — ALBUMIN HUMAN 25 % IV SOLN
25.0000 g | Freq: Four times a day (QID) | INTRAVENOUS | Status: AC
  Administered 2022-11-03 (×3): 25 g via INTRAVENOUS
  Filled 2022-11-03 (×3): qty 100

## 2022-11-03 MED ORDER — FUROSEMIDE 40 MG PO TABS
40.0000 mg | ORAL_TABLET | Freq: Every evening | ORAL | Status: DC
  Administered 2022-11-04 – 2022-11-05 (×2): 40 mg via ORAL
  Filled 2022-11-03 (×2): qty 1

## 2022-11-03 MED ORDER — FOLIC ACID 1 MG PO TABS
1.0000 mg | ORAL_TABLET | Freq: Every day | ORAL | Status: DC
  Administered 2022-11-03 – 2022-11-06 (×4): 1 mg via ORAL
  Filled 2022-11-03 (×4): qty 1

## 2022-11-03 NOTE — Progress Notes (Signed)
[facility] Progress Note  Name: [name] MRN: [id number] DOB: 02-04-1934  Chief Complaint: AKI  Subjective: UOP 960 although some unmeasured output. Breathing is much improved-continues to be on room air.   Hospital course: Admitted on 6/18 for shortness of breath.  Found to have multifocal pneumonia on chest x-ray.  BNP was elevated to 400.3, was started on aztreonam, metronidazole and vancomycin. Echocardiogram with normal ejection fraction of 60 to 65%, G2DD, left atrium dilation.  Also had hypertensive urgency with blood pressures elevated up to SBP's of 200s.   Has been having persistent LE swelling and was given lasix 40 on 6/18, lasix 120 mg on 6/19 and 80 BID scheduled today.   Creatinine on admission was 3.02. Recent baseline in 2023 and 2024 around 2.8-3.0. Creatinine today is 3.56.  Review of systems:  Denies SOB, abdominal pain, vomiting   Intake/Output Summary (Last 24 hours) at 11/03/2022 0812 Last data filed at 11/02/2022 1839 Gross per 24 hour  Intake 720 ml  Output 50 ml  Net 670 ml    Vitals:  Vitals:   11/02/22 2032 11/03/22 0536 11/03/22 0744 11/03/22 0746  BP: (!) 168/60 (!) 162/65    Pulse: 67 79    Resp: 18 18    Temp: 98.1 F (36.7 C) 99 F (37.2 C)    TempSrc:      SpO2: 100% 94% 98% 98%  Weight:         Physical Exam:   General: NAD, awake, alert, responsive to questions Head: Normocephalic atraumatic CV: Regular rate and rhythm no murmurs rubs or gallops Respiratory: chest rises symmetrically,  no increased work of breathing on RA Abdomen: Soft, non-tender, non-distended, normoactive bowel sounds  Extremities: Moves upper and lower extremities freely, edema improved from yesterday, 2+ pitting to knees b/l  Medications reviewed   Labs:     Latest Ref Rng & Units 11/03/2022    5:44 AM 11/02/2022    3:31 AM 11/01/2022    3:05 AM  BMP  Glucose 70 - 99 mg/dL 409  811  914   BUN 8 - 23 mg/dL 59  53  54   Creatinine  0.44 - 1.00 mg/dL 7.82  9.56  2.13   Sodium 135 - 145 mmol/L 137  137  136   Potassium 3.5 - 5.1 mmol/L 4.4  4.4  4.5   Chloride 98 - 111 mmol/L 106  107  107   CO2 22 - 32 mmol/L 21  19  19    Calcium 8.9 - 10.3 mg/dL 8.1  7.9  7.7      Assessment/Plan:  #AKI on CKD IV Creatinine today is worsening at 3.56 recent baseline seems to be around 2.8-3.0 since July of last year to this year.  Likely pre-renal. Has been receiving significant diuresis for LE swelling. Respiratory status improved and patient remains on room air. Not too off from her baseline. At home takes lasix 80 AM and 40 PM.  -Switch to home lasix to 80 AM, 40 PM PO dose -Follows with Dr. [name] at [facility] -albumin 25 g x 1 -CTM   #Lower extremity edema  Diastolic HF  Moderate PAH Echocardiogram with normal ejection fraction of 60 to 65%, G2DD, left atrium dilation, moderate elevation in PA systolic pressure. Per chart review does have history of venous insufficiency when seen by Cardiology which could be contributing as well. Last visit 06/23/22 they recommended leg elevation, salt restriction, walk regularly, compression hose, weight loss and she  also had 2-3+ pitting edema at that time. DVT u/s negative. Amlodipine could be contributing as well. Improved after ACE wrap -Continue home lasix as above -ACE wrap LE to help with compression   #HTN Elevated to 150s-160s. On amlodipine, coreg, hydralazine TID. Possibly in setting of steroid use/illness.  - coreg increased from 6.25 daily to BID   #Multifocal pneumonia  Parainflueza On levaquin q48hrs. Prednisone course for possible asthma component.  -primary managing   #Anemia Hgb 7.9 yesterday baseline around 9-10. Folate low on labs. Receives aranesp outpatient (6/17) and follows with hematology -Monitor    Levin Erp, MD 11/03/2022 8:12 AM

## 2022-11-03 NOTE — Plan of Care (Signed)
Patient is resting in bed with family at bedside. No distress is noted or verbalized. Pain has been addressed and managed. Ace wraps have been reapplied to legs for swelling. Patient and family has been educated to not flush down urine for strict output of urine. Patient and family understands. Plan of care is ongoing.  Problem: Education: Goal: Knowledge of General Education information will improve Description: Including pain rating scale, medication(s)/side effects and non-pharmacologic comfort measures Outcome: Progressing   Problem: Health Behavior/Discharge Planning: Goal: Ability to manage health-related needs will improve Outcome: Progressing   Problem: Clinical Measurements: Goal: Ability to maintain clinical measurements within normal limits will improve Outcome: Progressing Goal: Will remain free from infection Outcome: Progressing Goal: Diagnostic test results will improve Outcome: Progressing Goal: Respiratory complications will improve Outcome: Progressing Goal: Cardiovascular complication will be avoided Outcome: Progressing   Problem: Activity: Goal: Risk for activity intolerance will decrease Outcome: Progressing   Problem: Nutrition: Goal: Adequate nutrition will be maintained Outcome: Progressing   Problem: Coping: Goal: Level of anxiety will decrease Outcome: Progressing   Problem: Elimination: Goal: Will not experience complications related to bowel motility Outcome: Progressing Goal: Will not experience complications related to urinary retention Outcome: Progressing   Problem: Pain Managment: Goal: General experience of comfort will improve Outcome: Progressing   Problem: Safety: Goal: Ability to remain free from injury will improve Outcome: Progressing   Problem: Skin Integrity: Goal: Risk for impaired skin integrity will decrease Outcome: Progressing   Problem: Activity: Goal: Ability to tolerate increased activity will improve Outcome:  Progressing   Problem: Clinical Measurements: Goal: Ability to maintain a body temperature in the normal range will improve Outcome: Progressing   Problem: Respiratory: Goal: Ability to maintain adequate ventilation will improve Outcome: Progressing Goal: Ability to maintain a clear airway will improve Outcome: Progressing

## 2022-11-03 NOTE — Progress Notes (Signed)
Patient has begun ferric infusion.Vitals are stable. Patient is talking with family. It was started late due to patient care and to ultimately be able to properly and safely monitor patient as per order and for high quality and competent care.  Lawana Pai, RN

## 2022-11-03 NOTE — Progress Notes (Signed)
[facility] PROGRESS NOTE   [name] WUJ:[id number] DOB: Apr 24, 1934 DOA: 10/30/2022  PCP: [name], MD  Brief History/Interval Summary: 89 y.o. female with medical history significant of essential hypertension, hyperlipidemia, asthma, CKD stage IV, DM type II, DVT, CKD, history of lupus, GERD who presented with complaints of shortness of breath over 2 weeks.  Concern was for multifocal pneumonia.  She was hospitalized for further management.  Consultants: None  Procedures: None    Subjective/Interval History: Patient mentions that her shortness of breath is improving.  Denies any chest pain.  Still has swelling in her legs.  No nausea vomiting.     Assessment/Plan:  Community-acquired pneumonia/parainfluenza infection Chest x-ray showed multifocal pneumonia. Procalcitonin 0.19.  Lactic acid level was normal. COVID-19 RSV and influenza PCR negative. Respiratory viral panel positive for parainfluenza. Patient with allergy to penicillin.  Patient was initially started on vancomycin and aztreonam.  MRSA PCR was negative.  WBC was better.  Changed over to levofloxacin. Respiratory status is slowly improving.  Chest x-ray did not show any new findings.  Will need a repeat chest x-ray in 4 to 6 weeks to make sure that the right-sided consolidation has resolved.  Acute diastolic CHF Patient with 2+ pitting edema bilateral lower extremities.  BNP was noted to be elevated.  Echocardiogram shows normal LVEF. Patient was started on IV furosemide.  Not much improvement noted in lower extremity edema.   Dose of Lasix was increased.  Creatinine is climbing.  Nephrology was consulted yesterday. No DVT noted on lower extremity Doppler studies.  Cystic structure identified in the left popliteal fossa.  Likely Baker's cyst. Continue ins and outs and daily weights.    Acute on CKD stage IV Followed by Dr. [name]. Back in 2022 in 2023 she is to have a creatinine of 2.3 or so.   Seems to have worsened in the last 6 months.   Creatinine has been trending upwards over the last 48 hours.  Nephrology is following.  Monitor urine output.  Avoid nephrotoxic agents.  Hypertensive urgency Blood pressure remains elevated.  However will not be too aggressive considering her advanced age and her renal dysfunction.   She is currently on amlodipine carvedilol doxazosin hydralazine.   Steroids could be contributing to elevated blood pressures.  Moderate persistent asthma Continue with inhalers.  Thought to have asthma exacerbation on admission.  Noted to be on prednisone which will be continued for now.  Will start tapering from tomorrow.  Anemia of chronic disease Slight drop in hemoglobin is noted.  No evidence of overt bleeding.  Anemia panel shows ferritin 248, iron 47, TIBC 139, folate 5.1 B12 1306.  Will start folic acid supplements.  Recheck labs tomorrow.  Hypomagnesemia Was repleted.    Hyperlipidemia Continue statin.  Anxiety Continue with home medications.  Obesity Estimated body mass index is 30.66 kg/m as calculated from the following:   Height as of 06/29/22: 5' (1.524 m).   Weight as of this encounter: 71.2 kg.  DVT Prophylaxis: Subcutaneous heparin Code Status: Full code Family Communication: Discussed with patient.  No family at bedside Disposition Plan: Home when medically stable.  Seen by PT and OT.  No needs identified.  Status is: Inpatient Remains inpatient appropriate because: Acute CHF, community-acquired pneumonia      Medications: Scheduled:  amLODipine  5 mg Oral Daily   arformoterol  15 mcg Nebulization BID   atorvastatin  40 mg Oral Daily   budesonide (PULMICORT) nebulizer solution  0.5  mg Nebulization BID   carvedilol  6.25 mg Oral BID WC   citalopram  20 mg Oral QPM   clopidogrel  75 mg Oral q AM   doxazosin  6 mg Oral QHS   folic acid  1 mg Oral Daily   [START ON 11/04/2022] furosemide  40 mg Oral QPM   [START ON  11/04/2022] furosemide  80 mg Oral q morning   guaiFENesin  600 mg Oral BID   heparin  5,000 Units Subcutaneous Q8H   hydrALAZINE  75 mg Oral TID   levofloxacin  500 mg Oral Q48H   montelukast  10 mg Oral Daily   pantoprazole  40 mg Oral BID AC   predniSONE  40 mg Oral Q breakfast   sodium chloride flush  3 mL Intravenous Q12H   Continuous:  albumin human 25 g (11/03/22 0826)    NGE:[id number] **OR** acetaminophen, albuterol, alum & mag hydroxide-simeth, hydrALAZINE, LORazepam, melatonin, ondansetron (ZOFRAN) IV  Antibiotics: Anti-infectives (From admission, onward)    Start     Dose/Rate Route Frequency Ordered Stop   11/01/22 1145  levofloxacin (LEVAQUIN) tablet 500 mg        500 mg Oral Every 48 hours 11/01/22 1045 11/07/22 1129   10/31/22 1000  aztreonam (AZACTAM) 1 g in sodium chloride 0.9 % 100 mL IVPB  Status:  Discontinued        1 g 200 mL/hr over 30 Minutes Intravenous Every 8 hours 10/31/22 0255 11/01/22 1029   10/31/22 0200  vancomycin (VANCOREADY) IVPB 1750 mg/350 mL        1,750 mg 175 mL/hr over 120 Minutes Intravenous  Once 10/31/22 0149 10/31/22 0710   10/31/22 0145  aztreonam (AZACTAM) 2 g in sodium chloride 0.9 % 100 mL IVPB        2 g 200 mL/hr over 30 Minutes Intravenous  Once 10/31/22 0138 10/31/22 0249   10/31/22 0145  metroNIDAZOLE (FLAGYL) IVPB 500 mg        500 mg 100 mL/hr over 60 Minutes Intravenous  Once 10/31/22 0138 10/31/22 0354   10/31/22 0145  vancomycin (VANCOCIN) IVPB 1000 mg/200 mL premix  Status:  Discontinued        1,000 mg 200 mL/hr over 60 Minutes Intravenous  Once 10/31/22 0138 10/31/22 0149       Objective:  Vital Signs  Vitals:   11/03/22 0536 11/03/22 0744 11/03/22 0746 11/03/22 0827  BP: (!) 162/65   (!) 175/71  Pulse: 79   71  Resp: 18     Temp: 99 F (37.2 C)   (!) 97.5 F (36.4 C)  TempSrc:      SpO2: 94% 98% 98% 95%  Weight:        Intake/Output Summary (Last 24 hours) at 11/03/2022 0928 Last data filed at  11/02/2022 1839 Gross per 24 hour  Intake 720 ml  Output 50 ml  Net 670 ml    Filed Weights   10/31/22 0137  Weight: 71.2 kg    General appearance: Awake alert.  In no distress Resp: Normal effort at rest today.  Improved air entry bilaterally.  No wheezing appreciated today. Cardio: S1-S2 is normal regular.  No S3-S4.  No rubs murmurs or bruit GI: Abdomen is soft.  Nontender nondistended.  Bowel sounds are present normal.  No masses organomegaly Extremities: Legs are covered with wraps.  Still has edema. Neurologic: Alert and oriented x3.  No focal neurological deficits.      Lab Results:  Data Reviewed:  I have personally reviewed following labs and reports of the imaging studies  CBC: Recent Labs  Lab 10/30/22 1235 10/31/22 0149 10/31/22 0427 11/01/22 0305 11/02/22 0331  WBC 5.7 11.9* 11.4* 11.6* 10.9*  NEUTROABS 3.9 10.0* 9.7*  --   --   HGB 8.7* 9.2* 8.3* 8.3* 7.9*  HCT 28.0* 30.5* 26.8* 26.1* 25.3*  MCV 93.6 99.0 94.4 94.2 94.4  PLT 181 169 172 177 178     Basic Metabolic Panel: Recent Labs  Lab 10/31/22 0149 10/31/22 0427 11/01/22 0305 11/02/22 0331 11/03/22 0544  NA 139 139 136 137 137  K 4.4 4.4 4.5 4.4 4.4  CL 108 108 107 107 106  CO2 20* 19* 19* 19* 21*  GLUCOSE 127* 123* 136* 143* 116*  BUN 58* 56* 54* 53* 59*  CREATININE 3.02* 2.99* 2.95* 3.20* 3.56*  CALCIUM 7.9* 7.7* 7.7* 7.9* 8.1*  MG  --  1.4* 1.8  --   --   PHOS  --  3.8  --   --   --      GFR: Estimated Creatinine Clearance: 10 mL/min (A) (by C-G formula based on SCr of 3.56 mg/dL (H)).  Liver Function Tests: Recent Labs  Lab 10/31/22 0149 10/31/22 0427  AST 16 15  ALT 9 8  ALKPHOS 45 39  BILITOT 0.6 0.5  PROT 5.5* 5.0*  ALBUMIN 2.5* 2.3*      Coagulation Profile: Recent Labs  Lab 10/31/22 0149  INR 1.0      Recent Results (from the past 240 hour(s))  Culture, blood (single)     Status: None (Preliminary result)   Collection Time: 10/31/22  1:47 AM    Specimen: BLOOD  Result Value Ref Range Status   Specimen Description BLOOD RIGHT ANTECUBITAL  Final   Special Requests   Final    BOTTLES DRAWN AEROBIC AND ANAEROBIC Blood Culture results may not be optimal due to an excessive volume of blood received in culture bottles   Culture   Final    NO GROWTH 3 DAYS Performed at [facility], [address], [place], [place] [address]    Report Status PENDING  Incomplete  Resp panel by RT-PCR (RSV, Flu A&B, Covid) Urine, Clean Catch     Status: None   Collection Time: 10/31/22  2:09 AM   Specimen: Urine, Clean Catch; Nasal Swab  Result Value Ref Range Status   SARS Coronavirus 2 by RT PCR NEGATIVE NEGATIVE Final   Influenza A by PCR NEGATIVE NEGATIVE Final   Influenza B by PCR NEGATIVE NEGATIVE Final    Comment: (NOTE) The Xpert Xpress SARS-CoV-2/FLU/RSV plus assay is intended as an aid in the diagnosis of influenza from Nasopharyngeal swab specimens and should not be used as a sole basis for treatment. Nasal washings and aspirates are unacceptable for Xpert Xpress SARS-CoV-2/FLU/RSV testing.  Fact Sheet for Patients: BloggerCourse.com  Fact Sheet for Healthcare Providers: SeriousBroker.it  This test is not yet approved or cleared by the [place] FDA and has been authorized for detection and/or diagnosis of SARS-CoV-2 by FDA under an Emergency Use Authorization (EUA). This EUA will remain in effect (meaning this test can be used) for the duration of the COVID-19 declaration under Section 564(b)(1) of the Act, 21 U.S.C. section 360bbb-3(b)(1), unless the authorization is terminated or revoked.     Resp Syncytial Virus by PCR NEGATIVE NEGATIVE Final    Comment: (NOTE) Fact Sheet for Patients: BloggerCourse.com  Fact Sheet for Healthcare Providers: SeriousBroker.it  This test  is not yet approved or cleared by the [place] and has been authorized for detection and/or diagnosis of SARS-CoV-2 by FDA under an Emergency Use Authorization (EUA). This EUA will remain in effect (meaning this test can be used) for the duration of the COVID-19 declaration under Section 564(b)(1) of the Act, 21 U.S.C. section 360bbb-3(b)(1), unless the authorization is terminated or revoked.  Performed at [facility], [address] [place], [place] [address]   MRSA Next Gen by PCR, Nasal     Status: None   Collection Time: 10/31/22  2:49 AM   Specimen: Nasal Mucosa; Nasal Swab  Result Value Ref Range Status   MRSA by PCR Next Gen NOT DETECTED NOT DETECTED Final    Comment: (NOTE) The GeneXpert MRSA Assay (FDA approved for NASAL specimens only), is one component of a comprehensive MRSA colonization surveillance program. It is not intended to diagnose MRSA infection nor to guide or monitor treatment for MRSA infections. Test performance is not FDA approved in patients less than 3 years old. Performed at [facility], [address] [place], [place] [address]   Respiratory (~20 pathogens) panel by PCR     Status: Abnormal   Collection Time: 11/01/22  2:27 PM   Specimen: Nasopharyngeal Swab; Respiratory  Result Value Ref Range Status   Adenovirus NOT DETECTED NOT DETECTED Final   Coronavirus 229E NOT DETECTED NOT DETECTED Final    Comment: (NOTE) The Coronavirus on the Respiratory Panel, DOES NOT test for the novel  Coronavirus (2019 nCoV)    Coronavirus HKU1 NOT DETECTED NOT DETECTED Final   Coronavirus NL63 NOT DETECTED NOT DETECTED Final   Coronavirus OC43 NOT DETECTED NOT DETECTED Final   Metapneumovirus NOT DETECTED NOT DETECTED Final   Rhinovirus / Enterovirus NOT DETECTED NOT DETECTED Final   Influenza A NOT DETECTED NOT DETECTED Final   Influenza B NOT DETECTED NOT DETECTED Final   Parainfluenza Virus 1 NOT DETECTED NOT DETECTED Final   Parainfluenza Virus 2 NOT DETECTED NOT DETECTED  Final   Parainfluenza Virus 3 DETECTED (A) NOT DETECTED Final   Parainfluenza Virus 4 NOT DETECTED NOT DETECTED Final   Respiratory Syncytial Virus NOT DETECTED NOT DETECTED Final   Bordetella pertussis NOT DETECTED NOT DETECTED Final   Bordetella Parapertussis NOT DETECTED NOT DETECTED Final   Chlamydophila pneumoniae NOT DETECTED NOT DETECTED Final   Mycoplasma pneumoniae NOT DETECTED NOT DETECTED Final    Comment: Performed at [facility], [address] [place], [place] [address]      Radiology Studies: VAS [place] LOWER EXTREMITY VENOUS (DVT)  Result Date: 11/02/2022  Lower Venous DVT Study Patient Name:  ENYA BUREAU [facility]  Date of Exam:   11/02/2022 Medical Rec #: [id number]           Accession #:    [id number] Date of Birth: 1934-03-07            Patient Gender: F Patient Age:   60 years Exam Location:  [facility] Procedure:      VAS [place] LOWER EXTREMITY VENOUS (DVT) Referring Phys: Osvaldo Shipper --------------------------------------------------------------------------------  Indications: Swelling.  Risk Factors: None identified. Limitations: Poor ultrasound/tissue interface. Comparison Study: No prior studies. Performing Technologist: Chanda Busing RVT  Examination Guidelines: A complete evaluation includes B-mode imaging, spectral Doppler, color Doppler, and power Doppler as needed of all accessible portions of each vessel. Bilateral testing is considered an integral part of a complete examination. Limited examinations for reoccurring indications  may be performed as noted. The reflux portion of the exam is performed with the patient in reverse Trendelenburg.  +---------+---------------+---------+-----------+----------+--------------+ RIGHT    CompressibilityPhasicitySpontaneityPropertiesThrombus Aging +---------+---------------+---------+-----------+----------+--------------+ CFV      Full           Yes      Yes                                  +---------+---------------+---------+-----------+----------+--------------+ SFJ      Full                                                        +---------+---------------+---------+-----------+----------+--------------+ FV Prox  Full                                                        +---------+---------------+---------+-----------+----------+--------------+ FV Mid   Full                                                        +---------+---------------+---------+-----------+----------+--------------+ FV DistalFull                                                        +---------+---------------+---------+-----------+----------+--------------+ PFV      Full                                                        +---------+---------------+---------+-----------+----------+--------------+ POP      Full           Yes      Yes                                 +---------+---------------+---------+-----------+----------+--------------+ PTV      Full                                                        +---------+---------------+---------+-----------+----------+--------------+ PERO     Full                                                        +---------+---------------+---------+-----------+----------+--------------+   +---------+---------------+---------+-----------+----------+-------------------+ LEFT     CompressibilityPhasicitySpontaneityPropertiesThrombus Aging      +---------+---------------+---------+-----------+----------+-------------------+ CFV      Full  Yes      Yes                                      +---------+---------------+---------+-----------+----------+-------------------+ SFJ      Full                                                             +---------+---------------+---------+-----------+----------+-------------------+ FV Prox  Full                                                              +---------+---------------+---------+-----------+----------+-------------------+ FV Mid                  Yes      Yes                                      +---------+---------------+---------+-----------+----------+-------------------+ FV Distal               Yes      Yes                                      +---------+---------------+---------+-----------+----------+-------------------+ PFV      Full                                                             +---------+---------------+---------+-----------+----------+-------------------+ POP      Full           Yes      Yes                                      +---------+---------------+---------+-----------+----------+-------------------+ PTV      Full                                                             +---------+---------------+---------+-----------+----------+-------------------+ PERO                                                  Not well visualized +---------+---------------+---------+-----------+----------+-------------------+     Summary: RIGHT: - There is no evidence of deep vein thrombosis in the lower extremity.  - No cystic structure found in the popliteal fossa.  LEFT: - There is no evidence of deep vein thrombosis in the lower extremity.  However, portions of this examination were limited- see technologist comments above.  - A cystic structure is found in the popliteal fossa.  *See table(s) above for measurements and observations. Electronically signed by Sherald Hess MD on 11/02/2022 at 2:56:59 PM.    Final    DG CHEST PORT 1 VIEW  Result Date: 11/02/2022 CLINICAL DATA:  Pneumonia EXAM: PORTABLE CHEST 1 VIEW COMPARISON:  10/31/2022 x-ray FINDINGS: Persistent consolidative opacity in the right upper lobe. Hyperinflation with chronic interstitial changes. No pneumothorax, effusion or edema. Enlarged cardiopericardial silhouette. IMPRESSION: Persistent right upper lobe opacity.  Recommend  continued follow-up. Electronically Signed   By: Karen Kays M.D.   On: 11/02/2022 12:41       LOS: 3 days   Heriberto Stmartin Rito Ehrlich  [facility] Pager on [email or web address]  11/03/2022, 9:28 AM

## 2022-11-03 NOTE — Care Management Important Message (Signed)
Important Message  Patient Details  Name: [name] MRN: [id number] Date of Birth: [birth year]   Medicare Important Message Given:  Yes     [name] Stefan Church 11/03/2022, 3:07 PM

## 2022-11-03 NOTE — Progress Notes (Signed)
Patient has completed iron infusion. Vitals are stable and patient is sitting in bed talking with daughter who is at bedside. Patient states that she feels fine and is A/O X4. No distress or abnormalities noted or verbalized. Plan of care is ongoing.   Lawana Pai, RN

## 2022-11-04 DIAGNOSIS — N184 Chronic kidney disease, stage 4 (severe): Secondary | ICD-10-CM | POA: Diagnosis not present

## 2022-11-04 DIAGNOSIS — B348 Other viral infections of unspecified site: Secondary | ICD-10-CM | POA: Diagnosis not present

## 2022-11-04 DIAGNOSIS — I5033 Acute on chronic diastolic (congestive) heart failure: Secondary | ICD-10-CM | POA: Diagnosis not present

## 2022-11-04 LAB — CBC
HCT: 25.4 % — ABNORMAL LOW (ref 36.0–46.0)
Hemoglobin: 7.7 g/dL — ABNORMAL LOW (ref 12.0–15.0)
MCH: 28.7 pg (ref 26.0–34.0)
MCHC: 30.3 g/dL (ref 30.0–36.0)
MCV: 94.8 fL (ref 80.0–100.0)
Platelets: 215 10*3/uL (ref 150–400)
RBC: 2.68 MIL/uL — ABNORMAL LOW (ref 3.87–5.11)
RDW: 15.5 % (ref 11.5–15.5)
WBC: 7.6 10*3/uL (ref 4.0–10.5)
nRBC: 0.5 % — ABNORMAL HIGH (ref 0.0–0.2)

## 2022-11-04 LAB — BASIC METABOLIC PANEL
Anion gap: 15 (ref 5–15)
BUN: 58 mg/dL — ABNORMAL HIGH (ref 8–23)
CO2: 19 mmol/L — ABNORMAL LOW (ref 22–32)
Calcium: 8.4 mg/dL — ABNORMAL LOW (ref 8.9–10.3)
Chloride: 106 mmol/L (ref 98–111)
Creatinine, Ser: 3.53 mg/dL — ABNORMAL HIGH (ref 0.44–1.00)
GFR, Estimated: 12 mL/min — ABNORMAL LOW (ref >60)
Glucose, Bld: 99 mg/dL (ref 70–99)
Potassium: 4.3 mmol/L (ref 3.5–5.1)
Sodium: 140 mmol/L (ref 135–145)

## 2022-11-04 LAB — CULTURE, BLOOD (SINGLE): Culture: NO GROWTH

## 2022-11-04 MED ORDER — SODIUM BICARBONATE 650 MG PO TABS
650.0000 mg | ORAL_TABLET | Freq: Two times a day (BID) | ORAL | Status: DC
  Administered 2022-11-04 – 2022-11-06 (×5): 650 mg via ORAL
  Filled 2022-11-04 (×5): qty 1

## 2022-11-04 MED ORDER — PREDNISONE 20 MG PO TABS
20.0000 mg | ORAL_TABLET | Freq: Every day | ORAL | Status: DC
  Administered 2022-11-05: 20 mg via ORAL
  Filled 2022-11-04: qty 1

## 2022-11-04 MED ORDER — HYDRALAZINE HCL 50 MG PO TABS
100.0000 mg | ORAL_TABLET | Freq: Three times a day (TID) | ORAL | Status: DC
  Administered 2022-11-04 – 2022-11-06 (×6): 100 mg via ORAL
  Filled 2022-11-04 (×6): qty 2

## 2022-11-04 NOTE — Progress Notes (Addendum)
[facility] NEPHROLOGY PROGRESS NOTE  Assessment/ Plan: Pt is a 89 y.o. yo female with history of hypertension, CKD stage IV, CHF admitted with shortness of breath due to pneumonia.  # Acute kidney injury on CKD stage IV: Baseline creatinine level seems to be around 2.8-3.  AKI presumably hemodynamically change with the use of diuretics, infection.  She received IV Lasix in the beginning which was switched to oral yesterday.  The creatinine now stable.  She has no signs or symptoms of uremia.    # Committee acquired pneumonia/parainfluenza infection: On levofloxacin per primary team.  Chest x-ray with no fluid overload.  # Acute diastolic CHF: Initially treated with IV diuretic which was switched to oral Lasix at home dose.  Continue salt and fluid restriction and close weight monitoring.  # Hypertensive urgency: Blood pressure still elevated.  She is currently on amlodipine, carvedilol, doxazosin, furosemide and hydralazine 75 mg 3 times daily.  She is requiring intermittent IV hydralazine.  I am going to increase hydralazine to 100 mg 3 times daily.  Monitor BP.  # Anemia due to CKD and iron deficiency: Ordered IV iron and Aranesp on 6/21.  Monitor lab.  # Metabolic acidosis: Add sodium bicarbonate.  Nothing much to add from renal perspective except as outlined above.  I will sign off, please call us back with question.  Follow-up with Dr. [name] after discharge from the hospital.   Subjective: Seen and examined at bedside.  No event overnight.  Denies nausea, vomiting, chest pain or shortness of breath.  Urine output is not recorded.  Her granddaughter was present at the bedside.  Objective Vital signs in last 24 hours: Vitals:   11/04/22 0500 11/04/22 0803 11/04/22 0830 11/04/22 0938  BP: (!) 179/63 (!) 188/69  (!) 162/66  Pulse: 83 72  74  Resp: 17 17    Temp: 98.4 F (36.9 C) 98.1 F (36.7 C)    TempSrc: Oral Oral    SpO2: 97% 96% 98%   Weight:       Weight  change:   Intake/Output Summary (Last 24 hours) at 11/04/2022 0942 Last data filed at 11/04/2022 0742 Gross per 24 hour  Intake 361.07 ml  Output 100 ml  Net 261.07 ml       Labs: RENAL PANEL Recent Labs  Lab 10/31/22 0149 10/31/22 0427 11/01/22 0305 11/02/22 0331 11/03/22 0544 11/04/22 0549  NA 139 139 136 137 137 140  K 4.4 4.4 4.5 4.4 4.4 4.3  CL 108 108 107 107 106 106  CO2 20* 19* 19* 19* 21* 19*  GLUCOSE 127* 123* 136* 143* 116* 99  BUN 58* 56* 54* 53* 59* 58*  CREATININE 3.02* 2.99* 2.95* 3.20* 3.56* 3.53*  CALCIUM 7.9* 7.7* 7.7* 7.9* 8.1* 8.4*  MG  --  1.4* 1.8  --   --   --   PHOS  --  3.8  --   --   --   --   ALBUMIN 2.5* 2.3*  --   --   --   --     Liver Function Tests: Recent Labs  Lab 10/31/22 0149 10/31/22 0427  AST 16 15  ALT 9 8  ALKPHOS 45 39  BILITOT 0.6 0.5  PROT 5.5* 5.0*  ALBUMIN 2.5* 2.3*   No results for input(s): "LIPASE", "AMYLASE" in the last 168 hours. No results for input(s): "AMMONIA" in the last 168 hours. CBC: Recent Labs    11/16/21 0000 11/21/21 1123 01/19/22 1115 02/20/22 1411  09/04/22 1241 10/30/22 1235 10/31/22 0149 10/31/22 0427 11/01/22 0305 11/02/22 0331 11/03/22 0544 11/04/22 0549  HGB 10.8*   < > 8.9*   < > 9.6*   < > 9.2* 8.3* 8.3* 7.9*  --  7.7*  MCV  --    < > 89.9   < > 92.8   < > 99.0 94.4 94.2 94.4  --  94.8  VITAMINB12  --   --   --   --   --   --   --   --   --   --  1,306*  --   FOLATE  --   --   --   --   --   --   --   --   --   --  5.1*  --   FERRITIN 172  --  139  --  180  --   --   --   --   --  248  --   TIBC 188  --   --   --   --   --   --   --   --   --  139*  --   IRON 70  --   --   --   --   --   --   --   --   --  47  --   RETICCTPCT  --   --   --   --   --   --   --   --   --   --  1.4  --    < > = values in this interval not displayed.    Cardiac Enzymes: No results for input(s): "CKTOTAL", "CKMB", "CKMBINDEX", "TROPONINI" in the last 168 hours. CBG: No results for input(s):  "GLUCAP" in the last 168 hours.  Iron Studies:  Recent Labs    11/03/22 0544  IRON 47  TIBC 139*  FERRITIN 248   Studies/Results: VAS [place] LOWER EXTREMITY VENOUS (DVT)  Result Date: 11/02/2022  Lower Venous DVT Study Patient Name:  KATHALENE SPORER [facility]  Date of Exam:   11/02/2022 Medical Rec #: [id number]           Accession #:    [id number] Date of Birth: 10-Apr-1934            Patient Gender: F Patient Age:   59 years Exam Location:  [facility] Procedure:      VAS [place] LOWER EXTREMITY VENOUS (DVT) Referring Phys: Osvaldo Shipper --------------------------------------------------------------------------------  Indications: Swelling.  Risk Factors: None identified. Limitations: Poor ultrasound/tissue interface. Comparison Study: No prior studies. Performing Technologist: Chanda Busing RVT  Examination Guidelines: A complete evaluation includes B-mode imaging, spectral Doppler, color Doppler, and power Doppler as needed of all accessible portions of each vessel. Bilateral testing is considered an integral part of a complete examination. Limited examinations for reoccurring indications may be performed as noted. The reflux portion of the exam is performed with the patient in reverse Trendelenburg.  +---------+---------------+---------+-----------+----------+--------------+ RIGHT    CompressibilityPhasicitySpontaneityPropertiesThrombus Aging +---------+---------------+---------+-----------+----------+--------------+ CFV      Full           Yes      Yes                                 +---------+---------------+---------+-----------+----------+--------------+ SFJ      Full                                                        +---------+---------------+---------+-----------+----------+--------------+  FV Prox  Full                                                        +---------+---------------+---------+-----------+----------+--------------+ FV Mid   Full                                                         +---------+---------------+---------+-----------+----------+--------------+ FV DistalFull                                                        +---------+---------------+---------+-----------+----------+--------------+ PFV      Full                                                        +---------+---------------+---------+-----------+----------+--------------+ POP      Full           Yes      Yes                                 +---------+---------------+---------+-----------+----------+--------------+ PTV      Full                                                        +---------+---------------+---------+-----------+----------+--------------+ PERO     Full                                                        +---------+---------------+---------+-----------+----------+--------------+   +---------+---------------+---------+-----------+----------+-------------------+ LEFT     CompressibilityPhasicitySpontaneityPropertiesThrombus Aging      +---------+---------------+---------+-----------+----------+-------------------+ CFV      Full           Yes      Yes                                      +---------+---------------+---------+-----------+----------+-------------------+ SFJ      Full                                                             +---------+---------------+---------+-----------+----------+-------------------+ FV Prox  Full                                                             +---------+---------------+---------+-----------+----------+-------------------+  FV Mid                  Yes      Yes                                      +---------+---------------+---------+-----------+----------+-------------------+ FV Distal               Yes      Yes                                      +---------+---------------+---------+-----------+----------+-------------------+ PFV      Full                                                              +---------+---------------+---------+-----------+----------+-------------------+ POP      Full           Yes      Yes                                      +---------+---------------+---------+-----------+----------+-------------------+ PTV      Full                                                             +---------+---------------+---------+-----------+----------+-------------------+ PERO                                                  Not well visualized +---------+---------------+---------+-----------+----------+-------------------+     Summary: RIGHT: - There is no evidence of deep vein thrombosis in the lower extremity.  - No cystic structure found in the popliteal fossa.  LEFT: - There is no evidence of deep vein thrombosis in the lower extremity. However, portions of this examination were limited- see technologist comments above.  - A cystic structure is found in the popliteal fossa.  *See table(s) above for measurements and observations. Electronically signed by Sherald Hess MD on 11/02/2022 at 2:56:59 PM.    Final    DG CHEST PORT 1 VIEW  Result Date: 11/02/2022 CLINICAL DATA:  Pneumonia EXAM: PORTABLE CHEST 1 VIEW COMPARISON:  10/31/2022 x-ray FINDINGS: Persistent consolidative opacity in the right upper lobe. Hyperinflation with chronic interstitial changes. No pneumothorax, effusion or edema. Enlarged cardiopericardial silhouette. IMPRESSION: Persistent right upper lobe opacity.  Recommend continued follow-up. Electronically Signed   By: Karen Kays M.D.   On: 11/02/2022 12:41    Medications: Infusions:   Scheduled Medications:  amLODipine  5 mg Oral Daily   arformoterol  15 mcg Nebulization BID   atorvastatin  40 mg Oral Daily   budesonide (PULMICORT) nebulizer solution  0.5 mg Nebulization BID   carvedilol  6.25 mg Oral BID WC   citalopram  20 mg Oral QPM  clopidogrel  75 mg Oral q AM    doxazosin  6 mg Oral QHS   folic acid  1 mg Oral Daily   furosemide  40 mg Oral QPM   furosemide  80 mg Oral q morning   guaiFENesin  600 mg Oral BID   heparin  5,000 Units Subcutaneous Q8H   hydrALAZINE  75 mg Oral TID   levofloxacin  500 mg Oral Q48H   montelukast  10 mg Oral Daily   pantoprazole  40 mg Oral BID AC   predniSONE  40 mg Oral Q breakfast   sodium chloride flush  3 mL Intravenous Q12H    have reviewed scheduled and prn medications.  Physical Exam: General:NAD, comfortable Heart:RRR, s1s2 nl Lungs:clear b/l, no crackle Abdomen:soft, Non-tender, non-distended Extremities:No peripheral edema Neurology: Alert awake and following commands.  Luwana Butrick Prasad Ariba Lehnen 11/04/2022,9:42 AM  LOS: 4 days

## 2022-11-04 NOTE — Plan of Care (Addendum)
Patient is resting in bed with family at bedside. Patient BP was high this morning and was given hydralazine. BP was reduced. Patient complained of headache and was given tylenol; patient reports a reduction in pain. She is stable and free of distress. No breathing complications. Patient asked if ACE wraps could be removed until she goes back to sleep. Night nurse was notified. Plan of care is ongoing.   Problem: Education: Goal: Knowledge of General Education information will improve Description: Including pain rating scale, medication(s)/side effects and non-pharmacologic comfort measures Outcome: Progressing   Problem: Health Behavior/Discharge Planning: Goal: Ability to manage health-related needs will improve Outcome: Progressing   Problem: Clinical Measurements: Goal: Ability to maintain clinical measurements within normal limits will improve Outcome: Progressing Goal: Will remain free from infection Outcome: Progressing Goal: Diagnostic test results will improve Outcome: Progressing Goal: Respiratory complications will improve Outcome: Progressing Goal: Cardiovascular complication will be avoided Outcome: Progressing   Problem: Activity: Goal: Risk for activity intolerance will decrease Outcome: Progressing   Problem: Nutrition: Goal: Adequate nutrition will be maintained Outcome: Progressing   Problem: Coping: Goal: Level of anxiety will decrease Outcome: Progressing   Problem: Elimination: Goal: Will not experience complications related to bowel motility Outcome: Progressing Goal: Will not experience complications related to urinary retention Outcome: Progressing   Problem: Pain Managment: Goal: General experience of comfort will improve Outcome: Progressing   Problem: Safety: Goal: Ability to remain free from injury will improve Outcome: Progressing   Problem: Skin Integrity: Goal: Risk for impaired skin integrity will decrease Outcome: Progressing    Problem: Activity: Goal: Ability to tolerate increased activity will improve Outcome: Progressing   Problem: Clinical Measurements: Goal: Ability to maintain a body temperature in the normal range will improve Outcome: Progressing   Problem: Respiratory: Goal: Ability to maintain adequate ventilation will improve Outcome: Progressing Goal: Ability to maintain a clear airway will improve Outcome: Progressing

## 2022-11-04 NOTE — Progress Notes (Signed)
[facility] PROGRESS NOTE   [name] QMV:[id number] DOB: July 01, 1933 DOA: 10/30/2022  PCP: [name], MD  Brief History/Interval Summary: 89 y.o. female with medical history significant of essential hypertension, hyperlipidemia, asthma, CKD stage IV, DM type II, DVT, CKD, history of lupus, GERD who presented with complaints of shortness of breath over 2 weeks.  Concern was for multifocal pneumonia.  She was hospitalized for further management.  Consultants: None  Procedures: None    Subjective/Interval History: Patient mentioned that she had an episode overnight where she got short of breath and was wheezing.  They had to give her nebulizer treatment.  Seems to have helped.  Feels better this morning.  Has been urinating.  No other complaints offered.  Granddaughter is at the bedside.   Assessment/Plan:  Community-acquired pneumonia/parainfluenza infection Chest x-ray showed multifocal pneumonia. Procalcitonin 0.19.  Lactic acid level was normal. COVID-19 RSV and influenza PCR negative. Respiratory viral panel positive for parainfluenza. Patient with allergy to penicillin.  Patient was initially started on vancomycin and aztreonam.  MRSA PCR was negative.  WBC was better.  Changed over to levofloxacin. Repeat chest x-ray did not show any new findings.  Will need a repeat chest x-ray in 4 to 6 weeks to make sure that the right-sided consolidation has resolved. Seems to be slowly improving.  Episode overnight resolved with nebulizer treatment.  She does have inhalers and nebulizer treatments at home.  Will monitor for additional day to make sure she does not get worse.  Acute diastolic CHF Patient with 2+ pitting edema bilateral lower extremities.  BNP was noted to be elevated.  Echocardiogram shows normal LVEF. Patient was started on IV furosemide.  Not much improvement noted in lower extremity edema.   Dose of Lasix was increased.  Creatinine is climbing.   Nephrology was consulted yesterday. No DVT noted on lower extremity Doppler studies.  Cystic structure identified in the left popliteal fossa.  Likely Baker's cyst. Amlodipine could also be contributing to her edema. Continue ins and outs and daily weights. Seems to be stable.  Diuretics were changed to oral by nephrology.  Acute on CKD stage IV Followed by Dr. [name]. Back in 2022 and 2023 she was noted to have a creatinine of 2.3 or so.  Seems to have worsened in the last 6 months.   Due to upward trending creatinine nephrology was consulted.  They have changed her diuretics to oral.  She was given albumin.  Stability in renal function noted today.  Monitor urine output.  Avoid nephrotoxic agents.  Recheck labs tomorrow.  Hypertensive urgency Blood pressure remains poorly controlled. She has noted to be on amlodipine, carvedilol, doxazosin, furosemide, hydralazine.  Dose of hydralazine was increased.  Steroids likely contributing.  Will decrease the dose of her steroids today.    Moderate persistent asthma Continue with inhalers.  Thought to have asthma exacerbation on admission.  Noted to be on prednisone which will be continued for now.  Start tapering.  Patient has inhalers and nebulizers at home.  Anemia of chronic disease No evidence for overt bleeding.   Anemia panel shows ferritin 248, iron 47, TIBC 139, folate 5.1 B12 1306.   Patient started on folic acid supplements.   Hemoglobin is low but stable.  Hypomagnesemia Was repleted.    Hyperlipidemia Continue statin.  Anxiety Continue with home medications.  Obesity Estimated body mass index is 30.66 kg/m as calculated from the following:   Height as of 06/29/22: 5' (1.524 m).  Weight as of this encounter: 71.2 kg.  DVT Prophylaxis: Subcutaneous heparin Code Status: Full code Family Communication: Discussed with patient.  No family at bedside Disposition Plan: Home when medically stable.  Seen by PT and OT.  No needs  identified.  Status is: Inpatient Remains inpatient appropriate because: Acute CHF, community-acquired pneumonia      Medications: Scheduled:  amLODipine  5 mg Oral Daily   arformoterol  15 mcg Nebulization BID   atorvastatin  40 mg Oral Daily   budesonide (PULMICORT) nebulizer solution  0.5 mg Nebulization BID   carvedilol  6.25 mg Oral BID WC   citalopram  20 mg Oral QPM   clopidogrel  75 mg Oral q AM   doxazosin  6 mg Oral QHS   folic acid  1 mg Oral Daily   furosemide  40 mg Oral QPM   furosemide  80 mg Oral q morning   guaiFENesin  600 mg Oral BID   heparin  5,000 Units Subcutaneous Q8H   hydrALAZINE  100 mg Oral TID   levofloxacin  500 mg Oral Q48H   montelukast  10 mg Oral Daily   pantoprazole  40 mg Oral BID AC   predniSONE  40 mg Oral Q breakfast   sodium bicarbonate  650 mg Oral BID   sodium chloride flush  3 mL Intravenous Q12H   Continuous:    ZOX:[id number] **OR** acetaminophen, albuterol, alum & mag hydroxide-simeth, hydrALAZINE, LORazepam, melatonin, ondansetron (ZOFRAN) IV  Antibiotics: Anti-infectives (From admission, onward)    Start     Dose/Rate Route Frequency Ordered Stop   11/01/22 1145  levofloxacin (LEVAQUIN) tablet 500 mg        500 mg Oral Every 48 hours 11/01/22 1045 11/07/22 1129   10/31/22 1000  aztreonam (AZACTAM) 1 g in sodium chloride 0.9 % 100 mL IVPB  Status:  Discontinued        1 g 200 mL/hr over 30 Minutes Intravenous Every 8 hours 10/31/22 0255 11/01/22 1029   10/31/22 0200  vancomycin (VANCOREADY) IVPB 1750 mg/350 mL        1,750 mg 175 mL/hr over 120 Minutes Intravenous  Once 10/31/22 0149 10/31/22 0710   10/31/22 0145  aztreonam (AZACTAM) 2 g in sodium chloride 0.9 % 100 mL IVPB        2 g 200 mL/hr over 30 Minutes Intravenous  Once 10/31/22 0138 10/31/22 0249   10/31/22 0145  metroNIDAZOLE (FLAGYL) IVPB 500 mg        500 mg 100 mL/hr over 60 Minutes Intravenous  Once 10/31/22 0138 10/31/22 0354   10/31/22 0145   vancomycin (VANCOCIN) IVPB 1000 mg/200 mL premix  Status:  Discontinued        1,000 mg 200 mL/hr over 60 Minutes Intravenous  Once 10/31/22 0138 10/31/22 0149       Objective:  Vital Signs  Vitals:   11/04/22 0500 11/04/22 0803 11/04/22 0830 11/04/22 0938  BP: (!) 179/63 (!) 188/69  (!) 162/66  Pulse: 83 72  74  Resp: 17 17    Temp: 98.4 F (36.9 C) 98.1 F (36.7 C)    TempSrc: Oral Oral    SpO2: 97% 96% 98%   Weight:        Intake/Output Summary (Last 24 hours) at 11/04/2022 1048 Last data filed at 11/04/2022 0742 Gross per 24 hour  Intake 311.07 ml  Output 100 ml  Net 211.07 ml    Filed Weights   10/31/22 0137  Weight: 71.2 kg  General appearance: Awake alert.  In no distress Resp: Coarse breath sounds.  No wheezing appreciated.  Normal effort at rest. Cardio: S1-S2 is normal regular.  No S3-S4.  No rubs murmurs or bruit GI: Abdomen is soft.  Nontender nondistended.  Bowel sounds are present normal.  No masses organomegaly Extremities: Legs are covered in wraps. Neurologic: Alert and oriented x3.  No focal neurological deficits.     Lab Results:  Data Reviewed: I have personally reviewed following labs and reports of the imaging studies  CBC: Recent Labs  Lab 10/30/22 1235 10/31/22 0149 10/31/22 0427 11/01/22 0305 11/02/22 0331 11/04/22 0549  WBC 5.7 11.9* 11.4* 11.6* 10.9* 7.6  NEUTROABS 3.9 10.0* 9.7*  --   --   --   HGB 8.7* 9.2* 8.3* 8.3* 7.9* 7.7*  HCT 28.0* 30.5* 26.8* 26.1* 25.3* 25.4*  MCV 93.6 99.0 94.4 94.2 94.4 94.8  PLT 181 169 172 177 178 215     Basic Metabolic Panel: Recent Labs  Lab 10/31/22 0427 11/01/22 0305 11/02/22 0331 11/03/22 0544 11/04/22 0549  NA 139 136 137 137 140  K 4.4 4.5 4.4 4.4 4.3  CL 108 107 107 106 106  CO2 19* 19* 19* 21* 19*  GLUCOSE 123* 136* 143* 116* 99  BUN 56* 54* 53* 59* 58*  CREATININE 2.99* 2.95* 3.20* 3.56* 3.53*  CALCIUM 7.7* 7.7* 7.9* 8.1* 8.4*  MG 1.4* 1.8  --   --   --   PHOS 3.8   --   --   --   --      GFR: Estimated Creatinine Clearance: 10.1 mL/min (A) (by C-G formula based on SCr of 3.53 mg/dL (H)).  Liver Function Tests: Recent Labs  Lab 10/31/22 0149 10/31/22 0427  AST 16 15  ALT 9 8  ALKPHOS 45 39  BILITOT 0.6 0.5  PROT 5.5* 5.0*  ALBUMIN 2.5* 2.3*      Coagulation Profile: Recent Labs  Lab 10/31/22 0149  INR 1.0      Recent Results (from the past 240 hour(s))  Culture, blood (single)     Status: None (Preliminary result)   Collection Time: 10/31/22  1:47 AM   Specimen: BLOOD  Result Value Ref Range Status   Specimen Description BLOOD RIGHT ANTECUBITAL  Final   Special Requests   Final    BOTTLES DRAWN AEROBIC AND ANAEROBIC Blood Culture results may not be optimal due to an excessive volume of blood received in culture bottles   Culture   Final    NO GROWTH 4 DAYS Performed at [facility], [address], [place], [place] [address]    Report Status PENDING  Incomplete  Resp panel by RT-PCR (RSV, Flu A&B, Covid) Urine, Clean Catch     Status: None   Collection Time: 10/31/22  2:09 AM   Specimen: Urine, Clean Catch; Nasal Swab  Result Value Ref Range Status   SARS Coronavirus 2 by RT PCR NEGATIVE NEGATIVE Final   Influenza A by PCR NEGATIVE NEGATIVE Final   Influenza B by PCR NEGATIVE NEGATIVE Final    Comment: (NOTE) The Xpert Xpress SARS-CoV-2/FLU/RSV plus assay is intended as an aid in the diagnosis of influenza from Nasopharyngeal swab specimens and should not be used as a sole basis for treatment. Nasal washings and aspirates are unacceptable for Xpert Xpress SARS-CoV-2/FLU/RSV testing.  Fact Sheet for Patients: BloggerCourse.com  Fact Sheet for Healthcare Providers: SeriousBroker.it  This test is not yet approved or cleared by the [place]  FDA and has been authorized for detection and/or diagnosis of SARS-CoV-2 by FDA under an Emergency Use Authorization  (EUA). This EUA will remain in effect (meaning this test can be used) for the duration of the COVID-19 declaration under Section 564(b)(1) of the Act, 21 U.S.C. section 360bbb-3(b)(1), unless the authorization is terminated or revoked.     Resp Syncytial Virus by PCR NEGATIVE NEGATIVE Final    Comment: (NOTE) Fact Sheet for Patients: BloggerCourse.com  Fact Sheet for Healthcare Providers: SeriousBroker.it  This test is not yet approved or cleared by the [place] FDA and has been authorized for detection and/or diagnosis of SARS-CoV-2 by FDA under an Emergency Use Authorization (EUA). This EUA will remain in effect (meaning this test can be used) for the duration of the COVID-19 declaration under Section 564(b)(1) of the Act, 21 U.S.C. section 360bbb-3(b)(1), unless the authorization is terminated or revoked.  Performed at [facility], [address] [place], [place] [address]   MRSA Next Gen by PCR, Nasal     Status: None   Collection Time: 10/31/22  2:49 AM   Specimen: Nasal Mucosa; Nasal Swab  Result Value Ref Range Status   MRSA by PCR Next Gen NOT DETECTED NOT DETECTED Final    Comment: (NOTE) The GeneXpert MRSA Assay (FDA approved for NASAL specimens only), is one component of a comprehensive MRSA colonization surveillance program. It is not intended to diagnose MRSA infection nor to guide or monitor treatment for MRSA infections. Test performance is not FDA approved in patients less than 33 years old. Performed at [facility], [address] [place], [place] [address]   Respiratory (~20 pathogens) panel by PCR     Status: Abnormal   Collection Time: 11/01/22  2:27 PM   Specimen: Nasopharyngeal Swab; Respiratory  Result Value Ref Range Status   Adenovirus NOT DETECTED NOT DETECTED Final   Coronavirus 229E NOT DETECTED NOT DETECTED Final    Comment: (NOTE) The Coronavirus on the Respiratory Panel,  DOES NOT test for the novel  Coronavirus (2019 nCoV)    Coronavirus HKU1 NOT DETECTED NOT DETECTED Final   Coronavirus NL63 NOT DETECTED NOT DETECTED Final   Coronavirus OC43 NOT DETECTED NOT DETECTED Final   Metapneumovirus NOT DETECTED NOT DETECTED Final   Rhinovirus / Enterovirus NOT DETECTED NOT DETECTED Final   Influenza A NOT DETECTED NOT DETECTED Final   Influenza B NOT DETECTED NOT DETECTED Final   Parainfluenza Virus 1 NOT DETECTED NOT DETECTED Final   Parainfluenza Virus 2 NOT DETECTED NOT DETECTED Final   Parainfluenza Virus 3 DETECTED (A) NOT DETECTED Final   Parainfluenza Virus 4 NOT DETECTED NOT DETECTED Final   Respiratory Syncytial Virus NOT DETECTED NOT DETECTED Final   Bordetella pertussis NOT DETECTED NOT DETECTED Final   Bordetella Parapertussis NOT DETECTED NOT DETECTED Final   Chlamydophila pneumoniae NOT DETECTED NOT DETECTED Final   Mycoplasma pneumoniae NOT DETECTED NOT DETECTED Final    Comment: Performed at [facility], [address] [place], [place] [address]      Radiology Studies: VAS [place] LOWER EXTREMITY VENOUS (DVT)  Result Date: 11/02/2022  Lower Venous DVT Study Patient Name:  JORDIN DAMBROSIO [facility]  Date of Exam:   11/02/2022 Medical Rec #: [id number]           Accession #:    [id number] Date of Birth: 14-Aug-1933            Patient Gender: F Patient Age:   57  years Exam Location:  [facility] Procedure:      VAS [place] LOWER EXTREMITY VENOUS (DVT) Referring Phys: Osvaldo Shipper --------------------------------------------------------------------------------  Indications: Swelling.  Risk Factors: None identified. Limitations: Poor ultrasound/tissue interface. Comparison Study: No prior studies. Performing Technologist: Chanda Busing RVT  Examination Guidelines: A complete evaluation includes B-mode imaging, spectral Doppler, color Doppler, and power Doppler as needed of all accessible portions of each vessel. Bilateral testing is considered an integral  part of a complete examination. Limited examinations for reoccurring indications may be performed as noted. The reflux portion of the exam is performed with the patient in reverse Trendelenburg.  +---------+---------------+---------+-----------+----------+--------------+ RIGHT    CompressibilityPhasicitySpontaneityPropertiesThrombus Aging +---------+---------------+---------+-----------+----------+--------------+ CFV      Full           Yes      Yes                                 +---------+---------------+---------+-----------+----------+--------------+ SFJ      Full                                                        +---------+---------------+---------+-----------+----------+--------------+ FV Prox  Full                                                        +---------+---------------+---------+-----------+----------+--------------+ FV Mid   Full                                                        +---------+---------------+---------+-----------+----------+--------------+ FV DistalFull                                                        +---------+---------------+---------+-----------+----------+--------------+ PFV      Full                                                        +---------+---------------+---------+-----------+----------+--------------+ POP      Full           Yes      Yes                                 +---------+---------------+---------+-----------+----------+--------------+ PTV      Full                                                        +---------+---------------+---------+-----------+----------+--------------+ PERO  Full                                                        +---------+---------------+---------+-----------+----------+--------------+   +---------+---------------+---------+-----------+----------+-------------------+ LEFT     CompressibilityPhasicitySpontaneityPropertiesThrombus Aging       +---------+---------------+---------+-----------+----------+-------------------+ CFV      Full           Yes      Yes                                      +---------+---------------+---------+-----------+----------+-------------------+ SFJ      Full                                                             +---------+---------------+---------+-----------+----------+-------------------+ FV Prox  Full                                                             +---------+---------------+---------+-----------+----------+-------------------+ FV Mid                  Yes      Yes                                      +---------+---------------+---------+-----------+----------+-------------------+ FV Distal               Yes      Yes                                      +---------+---------------+---------+-----------+----------+-------------------+ PFV      Full                                                             +---------+---------------+---------+-----------+----------+-------------------+ POP      Full           Yes      Yes                                      +---------+---------------+---------+-----------+----------+-------------------+ PTV      Full                                                             +---------+---------------+---------+-----------+----------+-------------------+ PERO  Not well visualized +---------+---------------+---------+-----------+----------+-------------------+     Summary: RIGHT: - There is no evidence of deep vein thrombosis in the lower extremity.  - No cystic structure found in the popliteal fossa.  LEFT: - There is no evidence of deep vein thrombosis in the lower extremity. However, portions of this examination were limited- see technologist comments above.  - A cystic structure is found in the popliteal fossa.  *See table(s) above for measurements and  observations. Electronically signed by Sherald Hess MD on 11/02/2022 at 2:56:59 PM.    Final        LOS: 4 days   Osvaldo Shipper  [facility] Pager on [email or web address]  11/04/2022, 10:48 AM

## 2022-11-05 DIAGNOSIS — I10 Essential (primary) hypertension: Secondary | ICD-10-CM

## 2022-11-05 DIAGNOSIS — N184 Chronic kidney disease, stage 4 (severe): Secondary | ICD-10-CM | POA: Diagnosis not present

## 2022-11-05 DIAGNOSIS — B348 Other viral infections of unspecified site: Secondary | ICD-10-CM | POA: Diagnosis not present

## 2022-11-05 LAB — CBC
HCT: 26.1 % — ABNORMAL LOW (ref 36.0–46.0)
Hemoglobin: 7.9 g/dL — ABNORMAL LOW (ref 12.0–15.0)
MCH: 28.5 pg (ref 26.0–34.0)
MCHC: 30.3 g/dL (ref 30.0–36.0)
MCV: 94.2 fL (ref 80.0–100.0)
Platelets: 225 10*3/uL (ref 150–400)
RBC: 2.77 MIL/uL — ABNORMAL LOW (ref 3.87–5.11)
RDW: 15.4 % (ref 11.5–15.5)
WBC: 7.7 10*3/uL (ref 4.0–10.5)
nRBC: 1.4 % — ABNORMAL HIGH (ref 0.0–0.2)

## 2022-11-05 LAB — BASIC METABOLIC PANEL
Anion gap: 10 (ref 5–15)
BUN: 55 mg/dL — ABNORMAL HIGH (ref 8–23)
CO2: 21 mmol/L — ABNORMAL LOW (ref 22–32)
Calcium: 8.2 mg/dL — ABNORMAL LOW (ref 8.9–10.3)
Chloride: 108 mmol/L (ref 98–111)
Creatinine, Ser: 3.5 mg/dL — ABNORMAL HIGH (ref 0.44–1.00)
GFR, Estimated: 12 mL/min — ABNORMAL LOW (ref >60)
Glucose, Bld: 108 mg/dL — ABNORMAL HIGH (ref 70–99)
Potassium: 4.2 mmol/L (ref 3.5–5.1)
Sodium: 139 mmol/L (ref 135–145)

## 2022-11-05 LAB — CULTURE, BLOOD (SINGLE)

## 2022-11-05 MED ORDER — CARVEDILOL 12.5 MG PO TABS
12.5000 mg | ORAL_TABLET | Freq: Two times a day (BID) | ORAL | Status: DC
  Administered 2022-11-05 – 2022-11-06 (×3): 12.5 mg via ORAL
  Filled 2022-11-05 (×3): qty 1

## 2022-11-05 MED ORDER — AMLODIPINE BESYLATE 10 MG PO TABS
10.0000 mg | ORAL_TABLET | Freq: Every day | ORAL | Status: DC
  Administered 2022-11-05 – 2022-11-06 (×2): 10 mg via ORAL
  Filled 2022-11-05 (×2): qty 1

## 2022-11-05 MED ORDER — DOXAZOSIN MESYLATE 4 MG PO TABS
4.0000 mg | ORAL_TABLET | Freq: Two times a day (BID) | ORAL | Status: DC
  Administered 2022-11-05 – 2022-11-06 (×3): 4 mg via ORAL
  Filled 2022-11-05 (×3): qty 1

## 2022-11-05 NOTE — Progress Notes (Signed)
[facility] PROGRESS NOTE   [name] WGN:[id number] DOB: 02-May-1934 DOA: 10/30/2022  PCP: Angela Headings, MD  Brief History/Interval Summary: 89 y.o. female with medical history significant of essential hypertension, hyperlipidemia, asthma, CKD stage IV, DM type II, DVT, CKD, history of lupus, GERD who presented with complaints of shortness of breath over 2 weeks.  Concern was for multifocal pneumonia.  She was hospitalized for further management.  Consultants: None  Procedures: None    Subjective/Interval History: Patient mentioned that for the most part she is feeling well.  Shortness of breath is improved.  Complains of headache this morning.  Blood pressure noted to be quite elevated.     Assessment/Plan:  Community-acquired pneumonia/parainfluenza infection Chest x-ray showed multifocal pneumonia. Procalcitonin 0.19.  Lactic acid level was normal. COVID-19 RSV and influenza PCR negative. Respiratory viral panel positive for parainfluenza. Patient with allergy to penicillin.  Patient was initially started on vancomycin and aztreonam.  MRSA PCR was negative.  WBC was better.  Changed over to levofloxacin. Repeat chest x-ray did not show any new findings.  Will need a repeat chest x-ray in 4 to 6 weeks to make sure that the right-sided consolidation has resolved. Respiratory status has improved.  Seems to be close to baseline.    Acute diastolic CHF Patient with 2+ pitting edema bilateral lower extremities.  BNP was noted to be elevated.  Echocardiogram shows normal LVEF. Patient was given IV furosemide.  Increase in creatinine noted.  Seen by nephrology.  Wraps were applied to lower extremities.  Edema appears to have improved. Back to oral diuretics. No DVT noted on lower extremity Doppler studies.  Cystic structure identified in the left popliteal fossa.  Likely Baker's cyst. Amlodipine could also be contributing to her edema.  Acute on CKD stage  IV Followed by Dr. [name]. Back in 2022 and 2023 she was noted to have a creatinine of 2.3 or so.  Seems to have worsened in the last 6 months.   Due to upward trending creatinine nephrology was consulted.  They have changed her diuretics to oral.  She was given albumin.   Renal function is stable.  Nephrology has signed off.  Outpatient follow-up with Dr. [name].    Hypertensive urgency Over the past 24 to 48 hours of blood pressures have been poorly controlled for unclear reason.  Clonidine as mentioned on her home medication list but patient mentions that she does not take this on a regular basis. Dose of amlodipine and carvedilol have been increased today.  Dose of hydralazine was increased yesterday.  Will change doxazosin to twice a day.  Discussed with nursing staff.  Blood pressure to be rechecked. Steroids could also be contributing and have been discontinued.    Moderate persistent asthma Continue with inhalers.  Thought to have asthma exacerbation on admission.  Completed several days of steroids which was stopped due to significantly high blood pressures. Patient has inhalers and nebulizer treatments at home.  Anemia of chronic disease No evidence for overt bleeding.   Anemia panel shows ferritin 248, iron 47, TIBC 139, folate 5.1 B12 1306.   Patient started on folic acid supplements.   Hemoglobin is low but stable.  Hypomagnesemia Was repleted.    Hyperlipidemia Continue statin.  Anxiety Continue with home medications.  Obesity Estimated body mass index is 30.66 kg/m as calculated from the following:   Height as of 06/29/22: 5' (1.524 m).   Weight as of this encounter: 71.2 kg.  DVT Prophylaxis:  Subcutaneous heparin Code Status: Full code Family Communication: Discussed with patient.  No family at bedside Disposition Plan: Home when medically stable.  Seen by PT and OT.  No needs identified.  Status is: Inpatient Remains inpatient appropriate because: Acute CHF,  community-acquired pneumonia      Medications: Scheduled:  amLODipine  10 mg Oral Daily   arformoterol  15 mcg Nebulization BID   atorvastatin  40 mg Oral Daily   budesonide (PULMICORT) nebulizer solution  0.5 mg Nebulization BID   carvedilol  12.5 mg Oral BID WC   citalopram  20 mg Oral QPM   clopidogrel  75 mg Oral q AM   doxazosin  4 mg Oral Q12H   folic acid  1 mg Oral Daily   furosemide  40 mg Oral QPM   furosemide  80 mg Oral q morning   guaiFENesin  600 mg Oral BID   heparin  5,000 Units Subcutaneous Q8H   hydrALAZINE  100 mg Oral TID   levofloxacin  500 mg Oral Q48H   montelukast  10 mg Oral Daily   pantoprazole  40 mg Oral BID AC   sodium bicarbonate  650 mg Oral BID   sodium chloride flush  3 mL Intravenous Q12H   Continuous:    [id number] **OR** acetaminophen, albuterol, alum & mag hydroxide-simeth, hydrALAZINE, LORazepam, melatonin, ondansetron (ZOFRAN) IV  Antibiotics: Anti-infectives (From admission, onward)    Start     Dose/Rate Route Frequency Ordered Stop   11/01/22 1145  levofloxacin (LEVAQUIN) tablet 500 mg        500 mg Oral Every 48 hours 11/01/22 1045 11/07/22 1129   10/31/22 1000  aztreonam (AZACTAM) 1 g in sodium chloride 0.9 % 100 mL IVPB  Status:  Discontinued        1 g 200 mL/hr over 30 Minutes Intravenous Every 8 hours 10/31/22 0255 11/01/22 1029   10/31/22 0200  vancomycin (VANCOREADY) IVPB 1750 mg/350 mL        1,750 mg 175 mL/hr over 120 Minutes Intravenous  Once 10/31/22 0149 10/31/22 0710   10/31/22 0145  aztreonam (AZACTAM) 2 g in sodium chloride 0.9 % 100 mL IVPB        2 g 200 mL/hr over 30 Minutes Intravenous  Once 10/31/22 0138 10/31/22 0249   10/31/22 0145  metroNIDAZOLE (FLAGYL) IVPB 500 mg        500 mg 100 mL/hr over 60 Minutes Intravenous  Once 10/31/22 0138 10/31/22 0354   10/31/22 0145  vancomycin (VANCOCIN) IVPB 1000 mg/200 mL premix  Status:  Discontinued        1,000 mg 200 mL/hr over 60 Minutes Intravenous   Once 10/31/22 0138 10/31/22 0149       Objective:  Vital Signs  Vitals:   11/05/22 0722 11/05/22 0724 11/05/22 0748 11/05/22 0815  BP:   (!) 191/68 (!) 203/69  Pulse:   69 69  Resp:   17   Temp:   98.3 F (36.8 C) 98.3 F (36.8 C)  TempSrc:   Oral Oral  SpO2: 99% 99% 98% 98%  Weight:       No intake or output data in the 24 hours ending 11/05/22 1104  Filed Weights   10/31/22 0137  Weight: 71.2 kg    General appearance: Awake alert.  In no distress Resp: Clear to auscultation bilaterally.  Normal effort Cardio: S1-S2 is normal regular.  No S3-S4.  No rubs murmurs or bruit GI: Abdomen is soft.  Nontender nondistended.  Bowel sounds are present normal.  No masses organomegaly Extremities: Lower extremity edema has improved. Neurologic: Alert and oriented x3.  No focal neurological deficits.       Lab Results:  Data Reviewed: I have personally reviewed following labs and reports of the imaging studies  CBC: Recent Labs  Lab 10/30/22 1235 10/30/22 1235 10/31/22 0149 10/31/22 0427 11/01/22 0305 11/02/22 0331 11/04/22 0549 11/05/22 0539  WBC 5.7   < > 11.9* 11.4* 11.6* 10.9* 7.6 7.7  NEUTROABS 3.9  --  10.0* 9.7*  --   --   --   --   HGB 8.7*   < > 9.2* 8.3* 8.3* 7.9* 7.7* 7.9*  HCT 28.0*  --  30.5* 26.8* 26.1* 25.3* 25.4* 26.1*  MCV 93.6  --  99.0 94.4 94.2 94.4 94.8 94.2  PLT 181   < > 169 172 177 178 215 225   < > = values in this interval not displayed.     Basic Metabolic Panel: Recent Labs  Lab 10/31/22 0427 11/01/22 0305 11/02/22 0331 11/03/22 0544 11/04/22 0549 11/05/22 0539  NA 139 136 137 137 140 139  K 4.4 4.5 4.4 4.4 4.3 4.2  CL 108 107 107 106 106 108  CO2 19* 19* 19* 21* 19* 21*  GLUCOSE 123* 136* 143* 116* 99 108*  BUN 56* 54* 53* 59* 58* 55*  CREATININE 2.99* 2.95* 3.20* 3.56* 3.53* 3.50*  CALCIUM 7.7* 7.7* 7.9* 8.1* 8.4* 8.2*  MG 1.4* 1.8  --   --   --   --   PHOS 3.8  --   --   --   --   --      GFR: Estimated  Creatinine Clearance: 10.2 mL/min (A) (by C-G formula based on SCr of 3.5 mg/dL (H)).  Liver Function Tests: Recent Labs  Lab 10/31/22 0149 10/31/22 0427  AST 16 15  ALT 9 8  ALKPHOS 45 39  BILITOT 0.6 0.5  PROT 5.5* 5.0*  ALBUMIN 2.5* 2.3*      Coagulation Profile: Recent Labs  Lab 10/31/22 0149  INR 1.0      Recent Results (from the past 240 hour(s))  Culture, blood (single)     Status: None   Collection Time: 10/31/22  1:47 AM   Specimen: BLOOD  Result Value Ref Range Status   Specimen Description BLOOD RIGHT ANTECUBITAL  Final   Special Requests   Final    BOTTLES DRAWN AEROBIC AND ANAEROBIC Blood Culture results may not be optimal due to an excessive volume of blood received in culture bottles   Culture   Final    NO GROWTH 5 DAYS Performed at [facility], [address], [place], [place] [address]    Report Status 11/05/2022 FINAL  Final  Resp panel by RT-PCR (RSV, Flu A&B, Covid) Urine, Clean Catch     Status: None   Collection Time: 10/31/22  2:09 AM   Specimen: Urine, Clean Catch; Nasal Swab  Result Value Ref Range Status   SARS Coronavirus 2 by RT PCR NEGATIVE NEGATIVE Final   Influenza A by PCR NEGATIVE NEGATIVE Final   Influenza B by PCR NEGATIVE NEGATIVE Final    Comment: (NOTE) The Xpert Xpress SARS-CoV-2/FLU/RSV plus assay is intended as an aid in the diagnosis of influenza from Nasopharyngeal swab specimens and should not be used as a sole basis for treatment. Nasal washings and aspirates are unacceptable for Xpert Xpress SARS-CoV-2/FLU/RSV testing.  Fact Sheet for Patients: BloggerCourse.com  Fact  Sheet for Healthcare Providers: SeriousBroker.it  This test is not yet approved or cleared by the [place] and has been authorized for detection and/or diagnosis of SARS-CoV-2 by FDA under an Emergency Use Authorization (EUA). This EUA will remain in effect (meaning this test can  be used) for the duration of the COVID-19 declaration under Section 564(b)(1) of the Act, 21 U.S.C. section 360bbb-3(b)(1), unless the authorization is terminated or revoked.     Resp Syncytial Virus by PCR NEGATIVE NEGATIVE Final    Comment: (NOTE) Fact Sheet for Patients: BloggerCourse.com  Fact Sheet for Healthcare Providers: SeriousBroker.it  This test is not yet approved or cleared by the [place] FDA and has been authorized for detection and/or diagnosis of SARS-CoV-2 by FDA under an Emergency Use Authorization (EUA). This EUA will remain in effect (meaning this test can be used) for the duration of the COVID-19 declaration under Section 564(b)(1) of the Act, 21 U.S.C. section 360bbb-3(b)(1), unless the authorization is terminated or revoked.  Performed at [facility], [address] [place], [place] [address]   MRSA Next Gen by PCR, Nasal     Status: None   Collection Time: 10/31/22  2:49 AM   Specimen: Nasal Mucosa; Nasal Swab  Result Value Ref Range Status   MRSA by PCR Next Gen NOT DETECTED NOT DETECTED Final    Comment: (NOTE) The GeneXpert MRSA Assay (FDA approved for NASAL specimens only), is one component of a comprehensive MRSA colonization surveillance program. It is not intended to diagnose MRSA infection nor to guide or monitor treatment for MRSA infections. Test performance is not FDA approved in patients less than 63 years old. Performed at [facility], [address], [place], [place] [address]   Respiratory (~20 pathogens) panel by PCR     Status: Abnormal   Collection Time: 11/01/22  2:27 PM   Specimen: Nasopharyngeal Swab; Respiratory  Result Value Ref Range Status   Adenovirus NOT DETECTED NOT DETECTED Final   Coronavirus 229E NOT DETECTED NOT DETECTED Final    Comment: (NOTE) The Coronavirus on the Respiratory Panel, DOES NOT test for the novel  Coronavirus (2019 nCoV)     Coronavirus HKU1 NOT DETECTED NOT DETECTED Final   Coronavirus NL63 NOT DETECTED NOT DETECTED Final   Coronavirus OC43 NOT DETECTED NOT DETECTED Final   Metapneumovirus NOT DETECTED NOT DETECTED Final   Rhinovirus / Enterovirus NOT DETECTED NOT DETECTED Final   Influenza A NOT DETECTED NOT DETECTED Final   Influenza B NOT DETECTED NOT DETECTED Final   Parainfluenza Virus 1 NOT DETECTED NOT DETECTED Final   Parainfluenza Virus 2 NOT DETECTED NOT DETECTED Final   Parainfluenza Virus 3 DETECTED (A) NOT DETECTED Final   Parainfluenza Virus 4 NOT DETECTED NOT DETECTED Final   Respiratory Syncytial Virus NOT DETECTED NOT DETECTED Final   Bordetella pertussis NOT DETECTED NOT DETECTED Final   Bordetella Parapertussis NOT DETECTED NOT DETECTED Final   Chlamydophila pneumoniae NOT DETECTED NOT DETECTED Final   Mycoplasma pneumoniae NOT DETECTED NOT DETECTED Final    Comment: Performed at [facility], [address], [place], [place] [address]      Radiology Studies: No results found.     LOS: 5 days   Kailany Dinunzio [employer] on [email or web address]  11/05/2022, 11:04 AM

## 2022-11-06 ENCOUNTER — Other Ambulatory Visit (HOSPITAL_COMMUNITY): Payer: Self-pay

## 2022-11-06 ENCOUNTER — Encounter: Payer: Self-pay | Admitting: Hematology

## 2022-11-06 DIAGNOSIS — I10 Essential (primary) hypertension: Secondary | ICD-10-CM | POA: Diagnosis not present

## 2022-11-06 MED ORDER — HYDRALAZINE HCL 100 MG PO TABS
100.0000 mg | ORAL_TABLET | Freq: Three times a day (TID) | ORAL | 1 refills | Status: DC
  Filled 2022-11-06: qty 90, 30d supply, fill #0

## 2022-11-06 MED ORDER — POTASSIUM CHLORIDE CRYS ER 10 MEQ PO TBCR
10.0000 meq | EXTENDED_RELEASE_TABLET | Freq: Every day | ORAL | 1 refills | Status: DC
  Filled 2022-11-06: qty 30, 30d supply, fill #0

## 2022-11-06 MED ORDER — FOLIC ACID 1 MG PO TABS
1.0000 mg | ORAL_TABLET | Freq: Every day | ORAL | 1 refills | Status: DC
  Filled 2022-11-06: qty 30, 30d supply, fill #0

## 2022-11-06 MED ORDER — DOXAZOSIN MESYLATE 4 MG PO TABS
4.0000 mg | ORAL_TABLET | Freq: Two times a day (BID) | ORAL | 1 refills | Status: DC
  Filled 2022-11-06: qty 60, 30d supply, fill #0

## 2022-11-06 MED ORDER — SODIUM BICARBONATE 650 MG PO TABS
650.0000 mg | ORAL_TABLET | Freq: Two times a day (BID) | ORAL | 1 refills | Status: DC
  Filled 2022-11-06: qty 60, 30d supply, fill #0

## 2022-11-06 MED ORDER — CARVEDILOL 12.5 MG PO TABS
12.5000 mg | ORAL_TABLET | Freq: Two times a day (BID) | ORAL | 1 refills | Status: DC
  Filled 2022-11-06: qty 60, 30d supply, fill #0

## 2022-11-06 MED ORDER — AMLODIPINE BESYLATE 10 MG PO TABS
10.0000 mg | ORAL_TABLET | Freq: Every day | ORAL | 1 refills | Status: AC
  Filled 2022-11-06: qty 30, 30d supply, fill #0

## 2022-11-06 MED ORDER — GUAIFENESIN ER 600 MG PO TB12
600.0000 mg | ORAL_TABLET | Freq: Two times a day (BID) | ORAL | 0 refills | Status: AC
  Filled 2022-11-06: qty 14, 7d supply, fill #0

## 2022-11-06 NOTE — Discharge Summary (Signed)
[facility]  Physician Discharge Summary   Patient ID: [name] MRN: [id number] DOB/AGE: 87-Jan-1935 87 y.o.  Admit date: 10/30/2022 Discharge date: 11/06/2022    PCP: [name], MD  DISCHARGE DIAGNOSES:    CAP (community acquired pneumonia) Parainfluenza   Diastolic congestive heart failure (HCC)   Hypertensive urgency   Moderate persistent asthma   Anemia of chronic disease   Hypomagnesemia   CKD (chronic kidney disease), stage IV (HCC)   Hyperlipidemia   RECOMMENDATIONS FOR OUTPATIENT FOLLOW UP: Patient instructed to follow-up with PCP within 1 week Patient will need a repeat chest x-ray in 4 to 6 weeks to ensure resolution of the right-sided consolidation. Nephrology to schedule outpatient appointment May need further blood pressure management and outpatient follow-up.   Home Health: None Equipment/Devices: None  CODE STATUS: Full code  DISCHARGE CONDITION: fair  Diet recommendation: Renal diet  INITIAL HISTORY: 89 y.o. female with medical history significant of essential hypertension, hyperlipidemia, asthma, CKD stage IV, DM type II, DVT, CKD, history of lupus, GERD who presented with complaints of shortness of breath over 2 weeks.  Concern was for multifocal pneumonia.  She was hospitalized for further management.   Consultants: Nephrology   Procedures: None   HOSPITAL COURSE:   Community-acquired pneumonia/parainfluenza infection Chest x-ray showed multifocal pneumonia. Procalcitonin 0.19.  Lactic acid level was normal. COVID-19 RSV and influenza PCR negative. Respiratory viral panel positive for parainfluenza. Patient with allergy to penicillin.  Patient was initially started on vancomycin and aztreonam.  MRSA PCR was negative.  WBC was better.  Changed over to levofloxacin.  She completed a 5-day course. Repeat chest x-ray did not show any new findings.  Will need a repeat chest x-ray in 4 to 6 weeks to make sure that the  right-sided consolidation has resolved. Respiratory status has improved.  Seems to be close to baseline.     Acute diastolic CHF Patient with 2+ pitting edema bilateral lower extremities.  BNP was noted to be elevated.  Echocardiogram shows normal LVEF. Patient was given IV furosemide.  Increase in creatinine noted.  Seen by nephrology.  Wraps were applied to lower extremities.  Edema appears to have improved. Back to oral diuretics. No DVT noted on lower extremity Doppler studies.  Cystic structure identified in the left popliteal fossa.  Likely Baker's cyst. Amlodipine could also be contributing to her edema. Outpatient monitoring.   Acute on CKD stage IV Followed by Dr. [name]. Back in 2022 and 2023 she was noted to have a creatinine of 2.3 or so.  Seems to have worsened in the last 6 months.   Due to upward trending creatinine nephrology was consulted.  They have changed her diuretics to oral.  She was given albumin.   Renal function is stable.  Nephrology has signed off.  Outpatient follow-up with Dr. [name].     Accelerated hypertension Patient with longstanding history of hypertension.  Experienced a higher than normal blood pressures here in the hospital likely due to steroids.  Adjustments were made to her antihypertensive regimen.  Seems to be better this morning.  She has a blood pressure machine at home.  She and her family was instructed to check her blood pressure at least twice a day.  Blood pressure parameters were provided with thresholds to reach out to providers.     Moderate persistent asthma Continue with inhalers.  Thought to have asthma exacerbation on admission.  Completed several days of steroids which was stopped due to significantly high  blood pressures. Patient has inhalers and nebulizer treatments at home. Respiratory status is stable.   Anemia of chronic disease No evidence for overt bleeding.   Anemia panel shows ferritin 248, iron 47, TIBC 139, folate 5.1 B12  1306.   Patient started on folic acid supplements.   Hemoglobin is low but stable. She was given IV iron and Aranesp.   Hypomagnesemia Was repleted.     Hyperlipidemia Continue statin.   Anxiety Continue with home medications.   Obesity Estimated body mass index is 30.66 kg/m as calculated from the following:   Height as of 06/29/22: 5' (1.524 m).   Weight as of this encounter: 71.2 kg.   Patient has improved.  Okay for discharge home today.   PERTINENT LABS:  The results of significant diagnostics from this hospitalization (including imaging, microbiology, ancillary and laboratory) are listed below for reference.    Microbiology: Recent Results (from the past 240 hour(s))  Culture, blood (single)     Status: None   Collection Time: 10/31/22  1:47 AM   Specimen: BLOOD  Result Value Ref Range Status   Specimen Description BLOOD RIGHT ANTECUBITAL  Final   Special Requests   Final    BOTTLES DRAWN AEROBIC AND ANAEROBIC Blood Culture results may not be optimal due to an excessive volume of blood received in culture bottles   Culture   Final    NO GROWTH 5 DAYS Performed at [facility], [address] [place], [place] [address]    Report Status 11/05/2022 FINAL  Final  Resp panel by RT-PCR (RSV, Flu A&B, Covid) Urine, Clean Catch     Status: None   Collection Time: 10/31/22  2:09 AM   Specimen: Urine, Clean Catch; Nasal Swab  Result Value Ref Range Status   SARS Coronavirus 2 by RT PCR NEGATIVE NEGATIVE Final   Influenza A by PCR NEGATIVE NEGATIVE Final   Influenza B by PCR NEGATIVE NEGATIVE Final    Comment: (NOTE) The Xpert Xpress SARS-CoV-2/FLU/RSV plus assay is intended as an aid in the diagnosis of influenza from Nasopharyngeal swab specimens and should not be used as a sole basis for treatment. Nasal washings and aspirates are unacceptable for Xpert Xpress SARS-CoV-2/FLU/RSV testing.  Fact Sheet for  Patients: BloggerCourse.com  Fact Sheet for Healthcare Providers: SeriousBroker.it  This test is not yet approved or cleared by the [place] FDA and has been authorized for detection and/or diagnosis of SARS-CoV-2 by FDA under an Emergency Use Authorization (EUA). This EUA will remain in effect (meaning this test can be used) for the duration of the COVID-19 declaration under Section 564(b)(1) of the Act, 21 U.S.C. section 360bbb-3(b)(1), unless the authorization is terminated or revoked.     Resp Syncytial Virus by PCR NEGATIVE NEGATIVE Final    Comment: (NOTE) Fact Sheet for Patients: BloggerCourse.com  Fact Sheet for Healthcare Providers: SeriousBroker.it  This test is not yet approved or cleared by the [place] FDA and has been authorized for detection and/or diagnosis of SARS-CoV-2 by FDA under an Emergency Use Authorization (EUA). This EUA will remain in effect (meaning this test can be used) for the duration of the COVID-19 declaration under Section 564(b)(1) of the Act, 21 U.S.C. section 360bbb-3(b)(1), unless the authorization is terminated or revoked.  Performed at [facility], [address], [place], [place] [address]   MRSA Next Gen by PCR, Nasal     Status: None   Collection Time: 10/31/22  2:49 AM   Specimen:  Nasal Mucosa; Nasal Swab  Result Value Ref Range Status   MRSA by PCR Next Gen NOT DETECTED NOT DETECTED Final    Comment: (NOTE) The GeneXpert MRSA Assay (FDA approved for NASAL specimens only), is one component of a comprehensive MRSA colonization surveillance program. It is not intended to diagnose MRSA infection nor to guide or monitor treatment for MRSA infections. Test performance is not FDA approved in patients less than 66 years old. Performed at [facility], [address], [place], [place] [address]   Respiratory (~20  pathogens) panel by PCR     Status: Abnormal   Collection Time: 11/01/22  2:27 PM   Specimen: Nasopharyngeal Swab; Respiratory  Result Value Ref Range Status   Adenovirus NOT DETECTED NOT DETECTED Final   Coronavirus 229E NOT DETECTED NOT DETECTED Final    Comment: (NOTE) The Coronavirus on the Respiratory Panel, DOES NOT test for the novel  Coronavirus (2019 nCoV)    Coronavirus HKU1 NOT DETECTED NOT DETECTED Final   Coronavirus NL63 NOT DETECTED NOT DETECTED Final   Coronavirus OC43 NOT DETECTED NOT DETECTED Final   Metapneumovirus NOT DETECTED NOT DETECTED Final   Rhinovirus / Enterovirus NOT DETECTED NOT DETECTED Final   Influenza A NOT DETECTED NOT DETECTED Final   Influenza B NOT DETECTED NOT DETECTED Final   Parainfluenza Virus 1 NOT DETECTED NOT DETECTED Final   Parainfluenza Virus 2 NOT DETECTED NOT DETECTED Final   Parainfluenza Virus 3 DETECTED (A) NOT DETECTED Final   Parainfluenza Virus 4 NOT DETECTED NOT DETECTED Final   Respiratory Syncytial Virus NOT DETECTED NOT DETECTED Final   Bordetella pertussis NOT DETECTED NOT DETECTED Final   Bordetella Parapertussis NOT DETECTED NOT DETECTED Final   Chlamydophila pneumoniae NOT DETECTED NOT DETECTED Final   Mycoplasma pneumoniae NOT DETECTED NOT DETECTED Final    Comment: Performed at [facility], [address] [place], [place] [address]     Labs:   Basic Metabolic Panel: Recent Labs  Lab 10/31/22 0427 11/01/22 0305 11/02/22 0331 11/03/22 0544 11/04/22 0549 11/05/22 0539  NA 139 136 137 137 140 139  K 4.4 4.5 4.4 4.4 4.3 4.2  CL 108 107 107 106 106 108  CO2 19* 19* 19* 21* 19* 21*  GLUCOSE 123* 136* 143* 116* 99 108*  BUN 56* 54* 53* 59* 58* 55*  CREATININE 2.99* 2.95* 3.20* 3.56* 3.53* 3.50*  CALCIUM 7.7* 7.7* 7.9* 8.1* 8.4* 8.2*  MG 1.4* 1.8  --   --   --   --   PHOS 3.8  --   --   --   --   --    Liver Function Tests: Recent Labs  Lab 10/31/22 0149 10/31/22 0427  AST 16 15  ALT 9 8   ALKPHOS 45 39  BILITOT 0.6 0.5  PROT 5.5* 5.0*  ALBUMIN 2.5* 2.3*    CBC: Recent Labs  Lab 10/30/22 1235 10/30/22 1235 10/31/22 0149 10/31/22 0427 11/01/22 0305 11/02/22 0331 11/04/22 0549 11/05/22 0539  WBC 5.7   < > 11.9* 11.4* 11.6* 10.9* 7.6 7.7  NEUTROABS 3.9  --  10.0* 9.7*  --   --   --   --   HGB 8.7*   < > 9.2* 8.3* 8.3* 7.9* 7.7* 7.9*  HCT 28.0*  --  30.5* 26.8* 26.1* 25.3* 25.4* 26.1*  MCV 93.6  --  99.0 94.4 94.2 94.4 94.8 94.2  PLT 181   < > 169 172 177 178 215 225   < > =  values in this interval not displayed.   BNP: BNP (last 3 results) Recent Labs    10/31/22 0149  BNP 400.3*     IMAGING STUDIES VAS [place] LOWER EXTREMITY VENOUS (DVT)  Result Date: 11/02/2022  Lower Venous DVT Study Patient Name:  BRYNLYN DADE [facility]  Date of Exam:   11/02/2022 Medical Rec #: [id number]           Accession #:    [id number] Date of Birth: [birth year]            Patient Gender: F Patient Age:   88 years Exam Location:  [facility] Procedure:      VAS [place] LOWER EXTREMITY VENOUS (DVT) Referring Phys: Osvaldo Shipper --------------------------------------------------------------------------------  Indications: Swelling.  Risk Factors: None identified. Limitations: Poor ultrasound/tissue interface. Comparison Study: No prior studies. Performing Technologist: Chanda Busing RVT  Examination Guidelines: A complete evaluation includes B-mode imaging, spectral Doppler, color Doppler, and power Doppler as needed of all accessible portions of each vessel. Bilateral testing is considered an integral part of a complete examination. Limited examinations for reoccurring indications may be performed as noted. The reflux portion of the exam is performed with the patient in reverse Trendelenburg.  +---------+---------------+---------+-----------+----------+--------------+ RIGHT    CompressibilityPhasicitySpontaneityPropertiesThrombus Aging  +---------+---------------+---------+-----------+----------+--------------+ CFV      Full           Yes      Yes                                 +---------+---------------+---------+-----------+----------+--------------+ SFJ      Full                                                        +---------+---------------+---------+-----------+----------+--------------+ FV Prox  Full                                                        +---------+---------------+---------+-----------+----------+--------------+ FV Mid   Full                                                        +---------+---------------+---------+-----------+----------+--------------+ FV DistalFull                                                        +---------+---------------+---------+-----------+----------+--------------+ PFV      Full                                                        +---------+---------------+---------+-----------+----------+--------------+ POP      Full           Yes  Yes                                 +---------+---------------+---------+-----------+----------+--------------+ PTV      Full                                                        +---------+---------------+---------+-----------+----------+--------------+ PERO     Full                                                        +---------+---------------+---------+-----------+----------+--------------+   +---------+---------------+---------+-----------+----------+-------------------+ LEFT     CompressibilityPhasicitySpontaneityPropertiesThrombus Aging      +---------+---------------+---------+-----------+----------+-------------------+ CFV      Full           Yes      Yes                                      +---------+---------------+---------+-----------+----------+-------------------+ SFJ      Full                                                              +---------+---------------+---------+-----------+----------+-------------------+ FV Prox  Full                                                             +---------+---------------+---------+-----------+----------+-------------------+ FV Mid                  Yes      Yes                                      +---------+---------------+---------+-----------+----------+-------------------+ FV Distal               Yes      Yes                                      +---------+---------------+---------+-----------+----------+-------------------+ PFV      Full                                                             +---------+---------------+---------+-----------+----------+-------------------+ POP      Full           Yes      Yes                                      +---------+---------------+---------+-----------+----------+-------------------+  PTV      Full                                                             +---------+---------------+---------+-----------+----------+-------------------+ PERO                                                  Not well visualized +---------+---------------+---------+-----------+----------+-------------------+     Summary: RIGHT: - There is no evidence of deep vein thrombosis in the lower extremity.  - No cystic structure found in the popliteal fossa.  LEFT: - There is no evidence of deep vein thrombosis in the lower extremity. However, portions of this examination were limited- see technologist comments above.  - A cystic structure is found in the popliteal fossa.  *See table(s) above for measurements and observations. Electronically signed by Sherald Hess MD on 11/02/2022 at 2:56:59 PM.    Final    DG CHEST PORT 1 VIEW  Result Date: 11/02/2022 CLINICAL DATA:  Pneumonia EXAM: PORTABLE CHEST 1 VIEW COMPARISON:  10/31/2022 x-ray FINDINGS: Persistent consolidative opacity in the right upper lobe. Hyperinflation with chronic  interstitial changes. No pneumothorax, effusion or edema. Enlarged cardiopericardial silhouette. IMPRESSION: Persistent right upper lobe opacity.  Recommend continued follow-up. Electronically Signed   By: Karen Kays M.D.   On: 11/02/2022 12:41   ECHOCARDIOGRAM COMPLETE  Result Date: 10/31/2022    ECHOCARDIOGRAM REPORT   Patient Name:   [name] [facility] Date of Exam: 10/31/2022 Medical Rec #:  [id number]          Height:       60.0 in Accession #:    [id number]         Weight:       157.0 lb Date of Birth:  30-Sep-1933           BSA:          1.684 m Patient Age:    88 years           BP:           186/88 mmHg Patient Gender: F                  HR:           81 bpm. Exam Location:  Inpatient Procedure: 2D Echo, Cardiac Doppler and Color Doppler Indications:    CHF  History:        Patient has prior history of Echocardiogram examinations, most                 recent 11/14/2018. Risk Factors:Hypertension.  Sonographer:    Darlys Gales Referring Phys: [id number] RONDELL A SMITH IMPRESSIONS  1. Left ventricular ejection fraction, by estimation, is 60 to 65%. The left ventricle has normal function. The left ventricle has no regional wall motion abnormalities. There is moderate concentric left ventricular hypertrophy. Left ventricular diastolic parameters are consistent with Grade II diastolic dysfunction (pseudonormalization). Elevated left atrial pressure.  2. Right ventricular systolic function is normal. The right ventricular size is mildly enlarged. There is moderately elevated pulmonary artery systolic pressure. The estimated right ventricular systolic pressure is 49.0 mmHg.  3. Left atrial size  was severely dilated.  4. The mitral valve is normal in structure. Mild mitral valve regurgitation.  5. The aortic valve is tricuspid. There is mild calcification of the aortic valve. There is mild thickening of the aortic valve. Aortic valve regurgitation is not visualized. Aortic valve sclerosis is present, with no evidence  of aortic valve stenosis.  6. The inferior vena cava is dilated in size with >50% respiratory variability, suggesting right atrial pressure of 8 mmHg.  7. Cannot exclude a small PFO. Comparison(s): No significant change from prior study. Prior images reviewed side by side. FINDINGS  Left Ventricle: Left ventricular ejection fraction, by estimation, is 60 to 65%. The left ventricle has normal function. The left ventricle has no regional wall motion abnormalities. The left ventricular internal cavity size was normal in size. There is  moderate concentric left ventricular hypertrophy. Left ventricular diastolic parameters are consistent with Grade II diastolic dysfunction (pseudonormalization). Elevated left atrial pressure. Right Ventricle: The right ventricular size is mildly enlarged. No increase in right ventricular wall thickness. Right ventricular systolic function is normal. There is moderately elevated pulmonary artery systolic pressure. The tricuspid regurgitant velocity is 3.20 m/s, and with an assumed right atrial pressure of 8 mmHg, the estimated right ventricular systolic pressure is 49.0 mmHg. Left Atrium: Left atrial size was severely dilated. Right Atrium: Right atrial size was normal in size. Pericardium: There is no evidence of pericardial effusion. Mitral Valve: The mitral valve is normal in structure. Mild mitral annular calcification. Mild mitral valve regurgitation, with centrally-directed jet. Tricuspid Valve: The tricuspid valve is normal in structure. Tricuspid valve regurgitation is mild. Aortic Valve: The aortic valve is tricuspid. There is mild calcification of the aortic valve. There is mild thickening of the aortic valve. Aortic valve regurgitation is not visualized. Aortic valve sclerosis is present, with no evidence of aortic valve stenosis. Aortic valve mean gradient measures 6.0 mmHg. Aortic valve peak gradient measures 9.9 mmHg. Pulmonic Valve: The pulmonic valve was grossly normal.  Pulmonic valve regurgitation is not visualized. No evidence of pulmonic stenosis. Aorta: The aortic root and ascending aorta are structurally normal, with no evidence of dilitation. Venous: The inferior vena cava is dilated in size with greater than 50% respiratory variability, suggesting right atrial pressure of 8 mmHg. IAS/Shunts: The interatrial septum is aneurysmal. There is right bowing of the interatrial septum, suggestive of elevated left atrial pressure. Cannot exclude a small PFO.  LEFT VENTRICLE PLAX 2D LVIDd:         4.80 cm Diastology LVIDs:         3.50 cm LV e' medial:    6.42 cm/s LV PW:         1.60 cm LV E/e' medial:  18.2 LV IVS:        1.30 cm LV e' lateral:   6.85 cm/s                        LV E/e' lateral: 17.1  RIGHT VENTRICLE             IVC RV S prime:     12.30 cm/s  IVC diam: 2.70 cm TAPSE (M-mode): 3.2 cm LEFT ATRIUM             Index        RIGHT ATRIUM           Index LA Vol (A2C):   74.6 ml 44.30 ml/m  RA Area:     13.90 cm LA  Vol (A4C):   60.9 ml 36.16 ml/m  RA Volume:   29.90 ml  17.75 ml/m LA Biplane Vol: 72.4 ml 42.99 ml/m  AORTIC VALVE AV Vmax:           157.00 cm/s AV Vmean:          114.000 cm/s AV VTI:            0.349 m AV Peak Grad:      9.9 mmHg AV Mean Grad:      6.0 mmHg LVOT Vmax:         112.00 cm/s LVOT Vmean:        75.600 cm/s LVOT VTI:          0.262 m LVOT/AV VTI ratio: 0.75  AORTA Ao Root diam: 2.70 cm Ao Asc diam:  2.30 cm MITRAL VALVE                TRICUSPID VALVE MV Area (PHT): 4.12 cm     TR Peak grad:   41.0 mmHg MV Decel Time: 184 msec     TR Vmax:        320.00 cm/s MV E velocity: 117.00 cm/s MV A velocity: 114.00 cm/s  SHUNTS MV E/A ratio:  1.03         Systemic VTI: 0.26 m Rachelle Hora Croitoru MD Electronically signed by Thurmon Fair MD Signature Date/Time: 10/31/2022/2:45:06 PM    Final    DG Chest 2 View  Result Date: 10/31/2022 CLINICAL DATA:  Shortness of breath EXAM: CHEST - 2 VIEW COMPARISON:  08/03/2022 FINDINGS: Stable enlargement of the  cardiomediastinal silhouette. Aortic atherosclerotic calcification. Chronic bronchitic changes and hyperinflation. Patchy airspace opacities in the right mid lung and right lower lung suspicious for multifocal pneumonia. No pleural effusion or pneumothorax. IMPRESSION: Patchy opacities in the right lung suspicious for multifocal pneumonia. Follow-up in 6-8 weeks is recommended to ensure resolution. Electronically Signed   By: Minerva Fester M.D.   On: 10/31/2022 01:26    DISCHARGE EXAMINATION: Vitals:   11/06/22 0600 11/06/22 0648 11/06/22 0816 11/06/22 0824  BP: (!) 182/60 (!) 181/64 (!) 167/59   Pulse: 65 66 65   Resp:   16   Temp:   97.8 F (36.6 C)   TempSrc:   Oral   SpO2:   97% 97%  Weight:       General appearance: Awake alert.  In no distress Resp: Clear to auscultation bilaterally.  Normal effort Cardio: S1-S2 is normal regular.  No S3-S4.  No rubs murmurs or bruit GI: Abdomen is soft.  Nontender nondistended.  Bowel sounds are present normal.  No masses organomegaly Extremities: Improved edema bilateral lower extremities   DISPOSITION: Home  Discharge Instructions     AMB Referral to Community Care Coordinaton ([facility]O Patients)   Complete by: As directed    Hospital Follow up referral/Care Coordination:  Extreme high risk score; new chronic dz, COPD/Multifocal pneumonia, HF; paraoinfluenza; readmission prevention measure  Primary Care Provider:  [name], MD, [place] [facility] plan: [employer]  Please assign on behalf of [facility] to a RN Care Coordinator for post hospital care coordination for readmission prevention follow up calls and assess for further needs.  Questions please call:   Charlesetta Shanks, RN BSN CCM [facility]  [phone] business mobile phone Toll free office [phone]  *Concierge Line  [phone] Fax number:  [phone] [email or web address]   Reason for Referral: Care Coordination ([facility]  patients)   Disease managment services needed: Nurse Case Manager   Diagnoses of:  Heart Failure COPD/ Pneumonia Kidney Failure     Expected date of contact: Urgent - 7 Days   Call MD for:  difficulty breathing, headache or visual disturbances   Complete by: As directed    Call MD for:  extreme fatigue   Complete by: As directed    Call MD for:  persistant dizziness or light-headedness   Complete by: As directed    Call MD for:  persistant nausea and vomiting   Complete by: As directed    Call MD for:  severe uncontrolled pain   Complete by: As directed    Call MD for:  temperature >100.4   Complete by: As directed    Discharge instructions   Complete by: As directed    Please take your medications as prescribed.  Dr. Edwena Bunde will call to schedule appointment in the next few weeks.  If you do not hear anything from their office by next week please call them.  Please check your blood pressure twice a day for now.  Seek attention if your systolic blood pressure stays consistently above 180 or drops below 120.  You were cared for by a hospitalist during your hospital stay. If you have any questions about your discharge medications or the care you received while you were in the hospital after you are discharged, you can call the unit and asked to speak with the hospitalist on call if the hospitalist that took care of you is not available. Once you are discharged, your primary care physician will handle any further medical issues. Please note that NO REFILLS for any discharge medications will be authorized once you are discharged, as it is imperative that you return to your primary care physician (or establish a relationship with a primary care physician if you do not have one) for your aftercare needs so that they can reassess your need for medications and monitor your lab  values. If you do not have a primary care physician, you can call [phone] for a physician referral.   Increase activity slowly   Complete by: As directed           Allergies as of 11/06/2022       Reactions   Penicillins Nausea And Vomiting, Other (See Comments)   Has patient had a PCN reaction causing immediate rash, facial/tongue/throat swelling, SOB or lightheadedness with hypotension: Y Has patient had a PCN reaction causing severe rash involving mucus membranes or skin necrosis: Y Has patient had a PCN reaction that required hospitalization: N Has patient had a PCN reaction occurring within the last 10 years: N If all of the above answers are "NO", then may proceed with Cephalosporin use.   Aspirin Other (See Comments)   Wheezing Acetaminophen is OK        Medication List     STOP taking these medications    azithromycin 250 MG tablet Commonly known as: ZITHROMAX   cloNIDine 0.1 MG tablet Commonly known as: CATAPRES   potassium chloride 10 MEQ tablet Commonly known as: KLOR-CON Replaced by: potassium chloride 10 MEQ tablet       TAKE these medications    acetaminophen 500 MG tablet Commonly known as: TYLENOL Take 500 mg by mouth in the morning and at bedtime.   AeroChamber Plus inhaler Use as instructed   albuterol (2.5 MG/3ML) 0.083% nebulizer solution Commonly known as: PROVENTIL Take 3 mLs (2.5 mg total) by  nebulization every 6 (six) hours as needed for shortness of breath or wheezing. What changed:  when to take this additional instructions   albuterol 108 (90 Base) MCG/ACT inhaler Commonly known as: VENTOLIN HFA USE 2 PUFFS EVERY 6 HOURS AS NEEDED FOR WHEEZING. What changed: See the new instructions.   amLODipine 10 MG tablet Commonly known as: NORVASC Take 1 tablet (10 mg total) by mouth daily. Start taking on: November 07, 2022 What changed:  medication strength how much to take   atorvastatin 40 MG tablet Commonly known as:  LIPITOR Take 1 tablet (40 mg total) by mouth daily.   atropine 1 % ophthalmic solution Place 1 drop into both eyes 2 (two) times daily.   Azelastine-Fluticasone 137-50 MCG/ACT Susp PLACE ONE SPRAY IN EACH NOSTRIL TWICE DAILY (IN THE MORNING AND AT BEDTIME) What changed: See the new instructions.   Breztri Aerosphere 160-9-4.8 MCG/ACT Aero Generic drug: Budeson-Glycopyrrol-Formoterol INHALE 2 PUFFS INTO THE LUNGS TWICE DAILY, IN THE MORNING & AT BEDTIME. What changed:  how much to take how to take this when to take this additional instructions   carvedilol 12.5 MG tablet Commonly known as: COREG Take 1 tablet (12.5 mg total) by mouth 2 (two) times daily with a meal. What changed:  medication strength how much to take when to take this   cetirizine 10 MG tablet Commonly known as: ZYRTEC Take 10 mg by mouth daily as needed for allergies or rhinitis.   citalopram 20 MG tablet Commonly known as: CELEXA Take 1 tablet (20 mg total) by mouth every evening.   clopidogrel 75 MG tablet Commonly known as: PLAVIX TAKE ONE TABLET BY MOUTH ONCE DAILY. What changed: when to take this   cyanocobalamin 1000 MCG tablet Commonly known as: VITAMIN B12 Take 1,000 mcg by mouth daily.   diclofenac Sodium 1 % Gel Commonly known as: Voltaren Apply 4 g topically 2 (two) times daily as needed (painful areas).   doxazosin 4 MG tablet Commonly known as: CARDURA Take 1 tablet (4 mg total) by mouth every 12 (twelve) hours. What changed:  medication strength how much to take when to take this   ferrous sulfate 325 (65 FE) MG EC tablet Take 1 tablet (325 mg total) by mouth daily with breakfast.   fluticasone 50 MCG/ACT nasal spray Commonly known as: FLONASE Place 2 sprays into both nostrils daily. What changed:  when to take this reasons to take this   folic acid 1 MG tablet Commonly known as: FOLVITE Take 1 tablet (1 mg total) by mouth daily. Start taking on: November 07, 2022    furosemide 40 MG tablet Commonly known as: LASIX Take 40-80 mg by mouth See admin instructions. Take 80 mg by mouth in the morning and 40 mg in the early evening   hydrALAZINE 100 MG tablet Commonly known as: APRESOLINE Take 1 tablet (100 mg total) by mouth 3 (three) times daily. What changed:  medication strength See the new instructions.   LORazepam 0.5 MG tablet Commonly known as: ATIVAN Take 1 tablet (0.5 mg total) by mouth 2 (two) times daily as needed. for anxiety   montelukast 10 MG tablet Commonly known as: SINGULAIR TAKE ONE TABLET BY MOUTH ONCE DAILY What changed: when to take this   Mucus Relief 600 MG 12 hr tablet Generic drug: guaiFENesin Take 1 tablet (600 mg total) by mouth 2 (two) times daily for 7 days.   ondansetron 8 MG disintegrating tablet Commonly known as: ZOFRAN-ODT 8mg  ODT q4 hours prn  nausea What changed:  how much to take how to take this when to take this reasons to take this additional instructions   pantoprazole 40 MG tablet Commonly known as: PROTONIX TAKE ONE TABLET BY MOUTH TWICE DAILY What changed: when to take this   potassium chloride 10 MEQ tablet Commonly known as: KLOR-CON M Take 1 tablet (10 mEq total) by mouth daily. Replaces: potassium chloride 10 MEQ tablet   Restasis 0.05 % ophthalmic emulsion Generic drug: cycloSPORINE Place 1 drop into both eyes 2 (two) times daily.   sodium bicarbonate 650 MG tablet Take 1 tablet (650 mg total) by mouth 2 (two) times daily.   Vitamin D (Cholecalciferol) 25 MCG (1000 UT) Tabs Take 1,000 Units by mouth daily.          Follow-up Information     Panosh, Neta Mends, MD. Schedule an appointment as soon as possible for a visit in 1 week(s).   Specialties: Internal Medicine, Pediatrics Why: post hospitalization follow up Contact information: [address] [place] [address] [phone]         Bufford Buttner, MD Follow up.   Specialty: Nephrology Contact  information: [address] [place] [address] [phone]                 TOTAL DISCHARGE TIME: 35 minutes  Leesha Veno Rito Ehrlich  [facility] Pager on [email or web address]  11/06/2022, 12:08 PM

## 2022-11-06 NOTE — Consult Note (Signed)
   [facility] Inpatient Consult   11/06/2022  [name] [facility] [birth year] [id number]  [facility] [[facility]]  Accountable Care Organization [ACO] Patient: [name]  Primary Care Provider:  [name], MD with [place] [facility] which is listed to provide the Transition of Care followup   Spoke with patient and granddaughter at bedside to explain [facility] Care Management services.   Plan:  Patient will receive post hospital discharge call and for assessments fo community needs for care/disease management.  RN Care Coordinator referral request placed for closer community follow up for readmission prevention measures.   Of note, [facility] Care Management services does not replace or interfere with any services that are arranged by inpatient Transition of Care [TOC] team     For additional questions or referrals please contact:    Charlesetta Shanks, RN BSN CCM [facility]  [phone] business mobile phone Toll free office [phone]  *Concierge Line  [phone] Fax number: [phone] [email or web address]

## 2022-11-07 ENCOUNTER — Telehealth: Payer: Self-pay

## 2022-11-07 ENCOUNTER — Ambulatory Visit: Payer: Self-pay

## 2022-11-07 NOTE — Chronic Care Management (AMB) (Signed)
   11/07/2022  [name] [facility] 03-08-1934 [id number]   Reason for Encounter: Patient is not currently enrolled in the CCM program. CCM enrollment status changed to "Previously enrolled"    Katha Cabal [facility] Health/Chronic Care Management [phone]

## 2022-11-07 NOTE — Transitions of Care (Post Inpatient/ED Visit) (Signed)
11/07/2022  Name: [name] MRN: [id number] DOB: August 17, 1933  Today's TOC FU Call Status: Today's TOC FU Call Status:: Successful TOC FU Call Competed TOC FU Call Complete Date: 11/07/22  Transition Care Management Follow-up Telephone Call Date of Discharge: 11/06/22 Discharge Facility: [facility]) Type of Discharge: Inpatient Admission Primary Inpatient Discharge Diagnosis:: Community Acquired Pneumonia How have you been since you were released from the hospital?: Better (Per grand-daughter) Any questions or concerns?: No  Items Reviewed: Did you receive and understand the discharge instructions provided?: Yes Medications obtained,verified, and reconciled?: Yes (Medications Reviewed) Any new allergies since your discharge?: No Dietary orders reviewed?: Yes Type of Diet Ordered:: Renal Do you have support at home?: Yes People in Home: grandchild(ren) Name of Support/Comfort Primary Source: [name]  Medications Reviewed Today: Medications Reviewed Today     Reviewed by Jodelle Gross, RN (Case Manager) on 11/07/22 at 1528  Med List Status: <None>   Medication Order Taking? Sig Documenting Provider Last Dose Status Informant  acetaminophen (TYLENOL) 500 MG tablet [id number]  Take 500 mg by mouth in the morning and at bedtime. [provider]  Active Family Member  albuterol (PROVENTIL) (2.5 MG/3ML) 0.083% nebulizer solution [id number] Yes Take 3 mLs (2.5 mg total) by nebulization every 6 (six) hours as needed for shortness of breath or wheezing.  Patient taking differently: Take 2.5 mg by nebulization See admin instructions. Nebulize 2.5 mg and inhale into the lungs in the morning and at bedtime- may use an additional 2 times a day as needed for shortness of breath or wheezing   Carlisle Beers, FNP Taking Active Family Member  albuterol (VENTOLIN HFA) 108 (90 Base) MCG/ACT inhaler [id number] Yes USE 2 PUFFS EVERY 6 HOURS AS NEEDED FOR WHEEZING.  Patient  taking differently: Inhale 2 puffs into the lungs every 6 (six) hours as needed for wheezing or shortness of breath.   Leslye Peer, MD Taking Active Family Member  amLODipine (NORVASC) 10 MG tablet [id number] Yes Take 1 tablet (10 mg total) by mouth daily. Osvaldo Shipper, MD Taking Active   atorvastatin (LIPITOR) 40 MG tablet [id number] Yes Take 1 tablet (40 mg total) by mouth daily. Nahser, Deloris Ping, MD Taking Active Family Member  atropine 1 % ophthalmic solution [id number] Yes Place 1 drop into both eyes 2 (two) times daily. [provider] Taking Active Family Member  Azelastine-Fluticasone 137-50 MCG/ACT SUSP [id number] Yes PLACE ONE SPRAY IN EACH NOSTRIL TWICE DAILY (IN THE MORNING AND AT BEDTIME)  Patient taking differently: Place 1 spray into both nostrils in the morning and at bedtime.   Leslye Peer, MD Taking Active Family Member  Budeson-Glycopyrrol-Formoterol (BREZTRI AEROSPHERE) 160-9-4.8 MCG/ACT Sandrea Matte [id number] Yes INHALE 2 PUFFS INTO THE LUNGS TWICE DAILY, IN THE MORNING & AT BEDTIME.  Patient taking differently: Inhale 2 puffs into the lungs in the morning and at bedtime.   Leslye Peer, MD Taking Active Family Member  carvedilol (COREG) 12.5 MG tablet [id number] Yes Take 1 tablet (12.5 mg total) by mouth 2 (two) times daily with a meal. Osvaldo Shipper, MD Taking Active   cetirizine (ZYRTEC) 10 MG tablet [id number] Yes Take 10 mg by mouth daily as needed for allergies or rhinitis. [provider] Taking Active Family Member  citalopram (CELEXA) 20 MG tablet [id number]  Take 1 tablet (20 mg total) by mouth every evening. Panosh, Neta Mends, MD  Active Family Member  clopidogrel (PLAVIX) 75 MG tablet [id number] Yes TAKE ONE TABLET BY MOUTH ONCE DAILY.  Patient taking differently: Take 75 mg by mouth in the morning.   Panosh, Neta Mends, MD Taking Active Family Member  diclofenac Sodium (VOLTAREN) 1 % GEL [id number] No Apply 4 g topically 2 (two) times daily as needed  (painful areas). Ellsworth Lennox, PA-C Unknown Active Family Member  doxazosin (CARDURA) 4 MG tablet [id number] Yes Take 1 tablet (4 mg total) by mouth every 12 (twelve) hours. Osvaldo Shipper, MD Taking Active   ferrous sulfate 325 (65 FE) MG EC tablet [id number] Yes Take 1 tablet (325 mg total) by mouth daily with breakfast. Panosh, Neta Mends, MD Taking Active Family Member  fluticasone [facility]) 50 MCG/ACT nasal spray [id number] No Place 2 sprays into both nostrils daily.  Patient taking differently: Place 2 sprays into both nostrils daily as needed for allergies or rhinitis.   Leslye Peer, MD Unknown Active Family Member  folic acid (FOLVITE) 1 MG tablet [id number] Yes Take 1 tablet (1 mg total) by mouth daily. Osvaldo Shipper, MD Taking Active   furosemide (LASIX) 40 MG tablet [id number] Yes Take 40-80 mg by mouth See admin instructions. Take 80 mg by mouth in the morning and 40 mg in the early evening [provider] Taking Active Family Member  guaiFENesin (MUCINEX) 600 MG 12 hr tablet [id number] Yes Take 1 tablet (600 mg total) by mouth 2 (two) times daily for 7 days. Osvaldo Shipper, MD Taking Active   hydrALAZINE (APRESOLINE) 100 MG tablet [id number] Yes Take 1 tablet (100 mg total) by mouth 3 (three) times daily. Osvaldo Shipper, MD Taking Active   LORazepam (ATIVAN) 0.5 MG tablet [id number] Yes Take 1 tablet (0.5 mg total) by mouth 2 (two) times daily as needed. for anxiety Panosh, Neta Mends, MD Taking Active Family Member  montelukast (SINGULAIR) 10 MG tablet [id number] Yes TAKE ONE TABLET BY MOUTH ONCE DAILY  Patient taking differently: Take 10 mg by mouth at bedtime.   Eulis Foster, FNP Taking Active Family Member  ondansetron (ZOFRAN-ODT) 8 MG disintegrating tablet [id number] No 8mg  ODT q4 hours prn nausea  Patient taking differently: Take 8 mg by mouth every 4 (four) hours as needed for nausea (DISSOLVE ORALLY).   Dione Booze, MD Unknown Active Family Member  pantoprazole (PROTONIX)  40 MG tablet [id number] Yes TAKE ONE TABLET BY MOUTH TWICE DAILY  Patient taking differently: Take 40 mg by mouth 2 (two) times daily before a meal.   Panosh, Neta Mends, MD Taking Active Family Member  potassium chloride (KLOR-CON M) 10 MEQ tablet [id number] Yes Take 1 tablet (10 mEq total) by mouth daily. Osvaldo Shipper, MD Taking Active    Patient taking differently:   Discontinued 11/06/22 0938 (Reorder) RESTASIS 0.05 % ophthalmic emulsion [id number] Yes Place 1 drop into both eyes 2 (two) times daily. [provider] Taking Active Family Member  sodium bicarbonate 650 MG tablet [id number] Yes Take 1 tablet (650 mg total) by mouth 2 (two) times daily. Osvaldo Shipper, MD Taking Active   Spacer/Aero-Holding Chambers (AEROCHAMBER PLUS) inhaler [id number] Yes Use as instructed Domenick Gong, MD Taking Active   vitamin B-12 (CYANOCOBALAMIN) 1000 MCG tablet [id number] No Take 1,000 mcg by mouth daily. [provider] Unknown Active Family Member  Vitamin D, Cholecalciferol, 25 MCG (1000 UT) TABS [id number] No Take 1,000 Units by mouth daily. [provider] Unknown Active Family Member            Home Care and Equipment/Supplies: Were Home Health Services Ordered?: No Any new equipment or medical supplies ordered?: No  Functional Questionnaire: Do you need assistance with bathing/showering or dressing?: Yes Do you need assistance with meal preparation?: Yes Do you need assistance with eating?: No Do you have difficulty maintaining continence: No Do you need assistance with getting out of bed/getting out of a chair/moving?: Yes Do you have difficulty managing or taking your medications?: Yes  Follow up appointments reviewed: PCP Follow-up appointment confirmed?: Yes Date of PCP follow-up appointment?: 11/14/22 Follow-up Provider: Dr. Fabian Sharp [facility]ist [facility] Follow-up appointment confirmed?: NA Do you need transportation to your follow-up appointment?: No Do you  understand care options if your condition(s) worsen?: Yes-patient verbalized understanding  SDOH Interventions Today    Flowsheet Row Most Recent Value  SDOH Interventions   Food Insecurity Interventions Intervention Not Indicated  Housing Interventions Intervention Not Indicated  Transportation Interventions Intervention Not Indicated      TOC Interventions Today    Flowsheet Row Most Recent Value  TOC Interventions   TOC Interventions Discussed/Reviewed TOC Interventions Discussed, TOC Interventions Reviewed, Arranged PCP follow up within 7 days/Care Guide scheduled       Jodelle Gross, RN, BSN, CCM Care Management Coordinator [facility] Phone: [phone]/Fax: [phone]

## 2022-11-09 ENCOUNTER — Telehealth: Payer: Self-pay | Admitting: *Deleted

## 2022-11-09 NOTE — Progress Notes (Signed)
  Care Coordination   Note   11/09/2022 Name: [name] MRN: [id number] DOB: March 20, 1934  [name] is a 89 y.o. year old female who sees Panosh, Neta Mends, MD for primary care. I reached out to [facility] by phone today to offer care coordination services.  [name] was given information about Care Coordination services today including:   The Care Coordination services include support from the care team which includes your Nurse Coordinator, Clinical Social Worker, or Pharmacist.  The Care Coordination team is here to help remove barriers to the health concerns and goals most important to you. Care Coordination services are voluntary, and the patient may decline or stop services at any time by request to their care team member.   Care Coordination Consent Status: Patient agreed to services and verbal consent obtained.   Follow up plan:  Telephone appointment with care coordination team member scheduled for:  12/01/2022  Encounter Outcome:  Pt. Scheduled from referral   Burman Nieves, [facility] Care Coordination Care Guide Direct Dial: [phone]

## 2022-11-14 ENCOUNTER — Ambulatory Visit (INDEPENDENT_AMBULATORY_CARE_PROVIDER_SITE_OTHER): Payer: Medicare Other | Admitting: Internal Medicine

## 2022-11-14 ENCOUNTER — Encounter: Payer: Self-pay | Admitting: Internal Medicine

## 2022-11-14 VITALS — BP 160/60 | HR 62 | Temp 98.2°F | Ht 60.0 in | Wt 142.0 lb

## 2022-11-14 DIAGNOSIS — I5030 Unspecified diastolic (congestive) heart failure: Secondary | ICD-10-CM | POA: Diagnosis not present

## 2022-11-14 DIAGNOSIS — Z09 Encounter for follow-up examination after completed treatment for conditions other than malignant neoplasm: Secondary | ICD-10-CM

## 2022-11-14 DIAGNOSIS — J189 Pneumonia, unspecified organism: Secondary | ICD-10-CM

## 2022-11-14 DIAGNOSIS — N184 Chronic kidney disease, stage 4 (severe): Secondary | ICD-10-CM

## 2022-11-14 DIAGNOSIS — D649 Anemia, unspecified: Secondary | ICD-10-CM

## 2022-11-14 DIAGNOSIS — I272 Pulmonary hypertension, unspecified: Secondary | ICD-10-CM

## 2022-11-14 DIAGNOSIS — Z79899 Other long term (current) drug therapy: Secondary | ICD-10-CM

## 2022-11-14 NOTE — Patient Instructions (Signed)
God to see you today  Will await on Dr [name] and team to get fu labs and then plan fu  labs  if needed.   Last week July  or first week of August make appt for chest x ray fu here. ( I will place the order.)  Attention to good nutrition   ask renal team about best dietary  choices  with the kidney issues.   Pill  organizations may also help .

## 2022-11-14 NOTE — Progress Notes (Signed)
Chief Complaint  Patient presents with   Hospitalization Follow-up    HPI: [name] 89 y.o. come in with family member living with her for now for post hospital fu  for multyifocal pneumonia with underlying asthma complications  ckd med eval management POs screen for parainfluenza   Rx with Iv diuretics  for acute diastolic hf  noted to have left poss bakers cyst  Bp was not alwasys controlled  during hosp Hydralazine inc to 100 tid  Is to have fu nephrology  Cough better   phlegm is clearing up  Since hosp breathing stable  no use of aprazolam  anxiety stable. Not eating much but better than in hospital. Now on bicarb and folic acid in addition to other meds  Right knee is an issue  using cane at home  Admit date: 10/30/2022 Discharge date: 11/06/2022     PCP: [name], MD   DISCHARGE DIAGNOSES:    CAP (community acquired pneumonia) Parainfluenza   Diastolic congestive heart failure (HCC)   Hypertensive urgency   Moderate persistent asthma   Anemia of chronic disease   Hypomagnesemia   CKD (chronic kidney disease), stage IV (HCC)   Hyperlipidemia ROS: See pertinent positives and negatives per HPI.  Past Medical History:  Diagnosis Date   Acute superficial venous thrombosis of left lower extremity 03/27/2014   concern about hx and potential extensive on exam tenderness calf and low dose lovenox 40 qd 2-4 weekselevetion and close  fu advised .    Allergy    Anemia    Anxiety    Arthritis    "shoulders" (09/09/2012)   Asthma 09/08/2012   Carotid bruit    [place] 2018  low risk 1 - 39%   Cataract    bil cateracts removed   Chronic kidney disease    Clotting disorder (HCC)    blood clots in legs   Diabetes mellitus without complication (HCC)    "Borderline" per pt; being monitored.  no meds per pt   Diverticulosis    Dyspnea 04/27/2014   GERD (gastroesophageal reflux disease)    Headache(784.0)    "related to my high blood pressure" (09/09/2012)    Hiatal hernia    History of DVT (deep vein thrombosis)    "RLE" (09/09/2012) coumadine cant take asa so on plavix    HTN (hypertension) 09/08/2012   Hyperlipidemia    Lupus (HCC)    "cured years ago" (09/09/2012)   Other dysphagia 02/11/2013   Syncope and collapse 01/21/2018   Varicose veins    Varicose veins of leg with complications 12/05/2010    Family History  Problem Relation Age of Onset   Hypertension Mother    Lung cancer Mother    Hypertension Father    Lung cancer Father    Breast cancer Sister    Colon cancer Sister        ? 88' s dx - died in 63's   Breast cancer Sister    Hypertension Brother    Rectal cancer Brother    Stomach cancer Brother    Breast cancer Brother    Heart attack Daughter    Esophageal cancer Daughter    Hypertension Daughter    Diabetes Daughter    Breast cancer Paternal Aunt    Lung cancer Other        both parents    Pancreatic cancer Neg Hx     Social History   Socioeconomic History   Marital status: Single  Spouse name: Not on file   Number of children: 6   Years of education: Not on file   Highest education level: Not on file  Occupational History   Occupation: retired  Tobacco Use   Smoking status: Never   Smokeless tobacco: Never  Vaping Use   Vaping Use: Never used  Substance and Sexual Activity   Alcohol use: No   Drug use: No   Sexual activity: Not Currently    Birth control/protection: Surgical  Other Topics Concern   Not on file  Social History Narrative   2 people living in the home.  Grand daughter.   Up and down through the night      Had 7 children  2 deceased . 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    works 3 days per week currently .   Glasses dentures  Neg tad    Social Determinants of Health   Financial Resource Strain: Low Risk  (04/14/2022)   Overall Financial Resource Strain (CARDIA)    Difficulty of Paying  Living Expenses: Not very hard  Food Insecurity: No Food Insecurity (11/07/2022)   Hunger Vital Sign    Worried About Running Out of Food in the Last Year: Never true    Ran Out of Food in the Last Year: Never true  Transportation Needs: No Transportation Needs (11/07/2022)   PRAPARE - Administrator, Civil Service (Medical): No    Lack of Transportation (Non-Medical): No  Physical Activity: Not on file  Stress: Not on file  Social Connections: Not on file    Outpatient Medications Prior to Visit  Medication Sig Dispense Refill   acetaminophen (TYLENOL) 500 MG tablet Take 500 mg by mouth in the morning and at bedtime.     albuterol (PROVENTIL) (2.5 MG/3ML) 0.083% nebulizer solution Take 3 mLs (2.5 mg total) by nebulization every 6 (six) hours as needed for shortness of breath or wheezing. (Patient taking differently: Take 2.5 mg by nebulization See admin instructions. Nebulize 2.5 mg and inhale into the lungs in the morning and at bedtime- may use an additional 2 times a day as needed for shortness of breath or wheezing) 360 mL 6   albuterol (VENTOLIN HFA) 108 (90 Base) MCG/ACT inhaler USE 2 PUFFS EVERY 6 HOURS AS NEEDED FOR WHEEZING. (Patient taking differently: Inhale 2 puffs into the lungs every 6 (six) hours as needed for wheezing or shortness of breath.) 8.5 g 3   amLODipine (NORVASC) 10 MG tablet Take 1 tablet (10 mg total) by mouth daily. 30 tablet 1   atorvastatin (LIPITOR) 40 MG tablet Take 1 tablet (40 mg total) by mouth daily. 90 tablet 3   atropine 1 % ophthalmic solution Place 1 drop into both eyes 2 (two) times daily.     Azelastine-Fluticasone 137-50 MCG/ACT SUSP PLACE ONE SPRAY IN EACH NOSTRIL TWICE DAILY (IN THE MORNING AND AT BEDTIME) (Patient taking differently: Place 1 spray into both nostrils in the morning and at bedtime.) 23 g 6   Budeson-Glycopyrrol-Formoterol (BREZTRI AEROSPHERE) 160-9-4.8 MCG/ACT AERO INHALE 2 PUFFS INTO THE LUNGS TWICE DAILY, IN THE MORNING  & AT BEDTIME. (Patient taking differently: Inhale 2 puffs into the lungs in the morning and at bedtime.) 10.7 g 4   carvedilol (COREG) 12.5 MG tablet Take 1 tablet (12.5 mg total) by mouth 2 (two) times daily with a meal. 60 tablet 1  cetirizine (ZYRTEC) 10 MG tablet Take 10 mg by mouth daily as needed for allergies or rhinitis.     citalopram (CELEXA) 20 MG tablet Take 1 tablet (20 mg total) by mouth every evening. 90 tablet 0   clopidogrel (PLAVIX) 75 MG tablet TAKE ONE TABLET BY MOUTH ONCE DAILY. (Patient taking differently: Take 75 mg by mouth in the morning.) 30 tablet 1   diclofenac Sodium (VOLTAREN) 1 % GEL Apply 4 g topically 2 (two) times daily as needed (painful areas). 50 g 1   doxazosin (CARDURA) 4 MG tablet Take 1 tablet (4 mg total) by mouth every 12 (twelve) hours. 60 tablet 1   ferrous sulfate 325 (65 FE) MG EC tablet Take 1 tablet (325 mg total) by mouth daily with breakfast. 30 tablet 3   fluticasone (FLONASE) 50 MCG/ACT nasal spray Place 2 sprays into both nostrils daily. (Patient taking differently: Place 2 sprays into both nostrils daily as needed for allergies or rhinitis.) 16 g 2   folic acid (FOLVITE) 1 MG tablet Take 1 tablet (1 mg total) by mouth daily. 30 tablet 1   furosemide (LASIX) 40 MG tablet Take 40-80 mg by mouth See admin instructions. Take 80 mg by mouth in the morning and 40 mg in the early evening     hydrALAZINE (APRESOLINE) 100 MG tablet Take 1 tablet (100 mg total) by mouth 3 (three) times daily. 90 tablet 1   LORazepam (ATIVAN) 0.5 MG tablet Take 1 tablet (0.5 mg total) by mouth 2 (two) times daily as needed. for anxiety 30 tablet 0   montelukast (SINGULAIR) 10 MG tablet TAKE ONE TABLET BY MOUTH ONCE DAILY (Patient taking differently: Take 10 mg by mouth at bedtime.) 90 tablet 0   ondansetron (ZOFRAN-ODT) 8 MG disintegrating tablet 8mg  ODT q4 hours prn nausea (Patient taking differently: Take 8 mg by mouth every 4 (four) hours as needed for nausea (DISSOLVE  ORALLY).) 20 tablet 0   pantoprazole (PROTONIX) 40 MG tablet TAKE ONE TABLET BY MOUTH TWICE DAILY (Patient taking differently: Take 40 mg by mouth 2 (two) times daily before a meal.) 180 tablet 1   potassium chloride (KLOR-CON M) 10 MEQ tablet Take 1 tablet (10 mEq total) by mouth daily. 30 tablet 1   RESTASIS 0.05 % ophthalmic emulsion Place 1 drop into both eyes 2 (two) times daily.     sodium bicarbonate 650 MG tablet Take 1 tablet (650 mg total) by mouth 2 (two) times daily. 60 tablet 1   Spacer/Aero-Holding Chambers (AEROCHAMBER PLUS) inhaler Use as instructed 1 each 2   vitamin B-12 (CYANOCOBALAMIN) 1000 MCG tablet Take 1,000 mcg by mouth daily.     Vitamin D, Cholecalciferol, 25 MCG (1000 UT) TABS Take 1,000 Units by mouth daily.     No facility-administered medications prior to visit.     EXAM:  BP (!) 160/60   Pulse 62   Temp 98.2 F (36.8 C) (Oral)   Ht 5' (1.524 m)   Wt 142 lb (64.4 kg)   SpO2 95%   BMI 27.73 kg/m   Body mass index is 27.73 kg/m.  GENERAL: vitals reviewed and listed above, alert, oriented, appears well hydrated and in no acute distress HEENT: atraumatic, conjunctiva  clear, no obvious abnormalities on inspection of external nose and earsnl speech and conversation  NECK: no obvious masses on inspection palpation  LUNGS: clear to auscultation bilaterally, no wheezes, rales or rhonchi, good air movementbut bronchial cough  CV: HRRR, no clubbing cyanosis 2+  peripheral edema nl cap refill  MS: moves all extremities arise from chair independent push off  limps favors r leg  PSYCH: pleasant and cooperative,  Lab Results  Component Value Date   WBC 7.7 11/05/2022   HGB 7.9 (L) 11/05/2022   HCT 26.1 (L) 11/05/2022   PLT 225 11/05/2022   GLUCOSE 108 (H) 11/05/2022   CHOL 190 07/21/2022   TRIG 97 07/21/2022   HDL 92 07/21/2022   LDLCALC 81 07/21/2022   ALT 8 10/31/2022   AST 15 10/31/2022   NA 139 11/05/2022   K 4.2 11/05/2022   CL 108 11/05/2022    CREATININE 3.50 (H) 11/05/2022   BUN 55 (H) 11/05/2022   CO2 21 (L) 11/05/2022   TSH 2.85 06/29/2022   INR 1.0 10/31/2022   HGBA1C 5.3 06/29/2022   MICROALBUR 119 03/02/2021   BP Readings from Last 3 Encounters:  11/14/22 (!) 160/60  11/06/22 (!) 167/59  10/30/22 (!) 164/61    ASSESSMENT AND PLAN:  Discussed the following assessment and plan:  Community acquired pneumonia, unspecified laterality - Plan: DG Chest 2 View  Hospital discharge follow-up - Plan: DG Chest 2 View  Anemia, unspecified type  Pulmonary hypertension, unspecified (HCC)  Medication management  Chronic kidney disease (CKD), stage IV (severe) (HCC)  Diastolic congestive heart failure, unspecified HF chronicity (HCC) Plan fu x ray 4-6 weeks   Will order ( July 22 - Aug 5 week ) Make fu Dr [name] team  Plan fu 2 mos Disc nutrition and fall prevention  Glad she is  better  and I agree a lot of pills but organization and racking to help  -Patient advised to return or notify health care team  if  new concerns arise.  Patient Instructions  God to see you today  Will await on Dr [name] and team to get fu labs and then plan fu  labs  if needed.   Last week July  or first week of August make appt for chest x ray fu here. ( I will place the order.)  Attention to good nutrition   ask renal team about best dietary  choices  with the kidney issues.   Pill  organizations may also help .   Neta Mends. Tyrel Lex M.D.

## 2022-11-20 ENCOUNTER — Telehealth: Payer: Self-pay | Admitting: Internal Medicine

## 2022-11-20 NOTE — Telephone Encounter (Signed)
I called and spoke with patient's daughter, Hollie Salk. The FMLA paperwork is for when Myra has to bring patient to appointments. Filled out what I could and placed in your red folder.

## 2022-11-20 NOTE — Telephone Encounter (Signed)
Patient dropped off document FMLA, to be filled out by provider. Patient requested to send it back via Call Patient to pick up within 7-days. Document is located in providers tray at front office.Please advise at [facility] [phone]

## 2022-11-21 DIAGNOSIS — Z0279 Encounter for issue of other medical certificate: Secondary | ICD-10-CM

## 2022-11-21 NOTE — Telephone Encounter (Signed)
Patient's daughter is aware. Copy sent to scan, original up front for pick up.

## 2022-11-21 NOTE — Telephone Encounter (Signed)
Form finished and signed  to your desk

## 2022-12-01 ENCOUNTER — Ambulatory Visit: Payer: Self-pay

## 2022-12-01 NOTE — Patient Instructions (Signed)
Visit Information  Thank you for taking time to visit with me today. Please don't hesitate to contact me if I can be of assistance to you.   Following are the goals we discussed today:   Goals Addressed             This Visit's Progress    Care coordination Activities-No follow up required       Care Coordination Interventions: Evaluation of current treatment plan related to pneumonia and patient's adherence to plan as established by provider Advised patient to call physician for any signs of pneumonia coming back.  Reviewed scheduled/upcoming provider appointments including Pulmonologist and Nephrologist  Spoke with granddaughter [name]. She states patient is doing good. Slowly getting better. Appetite improving.  Patient remains independent and still drives.   Discussed [facility] support.  She declined further follow up at this time.          If you are experiencing a Mental Health or Behavioral Health Crisis or need someone to talk to, please call the Suicide and Crisis Lifeline: 988   Patient verbalizes understanding of instructions and care plan provided today and agrees to view in MyChart. Active MyChart status and patient understanding of how to access instructions and care plan via MyChart confirmed with patient.     The patient has been provided with contact information for the care management team and has been advised to call with any health related questions or concerns.   Bary Leriche, RN, MSN [facility] Care Management Care Management Coordinator Direct Line [phone]

## 2022-12-01 NOTE — Patient Outreach (Signed)
  Care Coordination   Initial Visit Note   12/01/2022 Name: [name] MRN: [id number] DOB: 1933-11-01  [name] is a 89 y.o. year old female who sees Panosh, Neta Mends, MD for primary care. I  granddaughter [name] by phone today.   What matters to the patients health and wellness today?  Getting better    Goals Addressed             This Visit's Progress    Care coordination Activities-No follow up required       Care Coordination Interventions: Evaluation of current treatment plan related to pneumonia and patient's adherence to plan as established by provider Advised patient to call physician for any signs of pneumonia coming back.  Reviewed scheduled/upcoming provider appointments including Pulmonologist and Nephrologist  Spoke with granddaughter [name]. She states patient is doing good. Slowly getting better. Appetite improving.  Patient remains independent and still drives.   Discussed [employer] support.  She declined further follow up at this time.          SDOH assessments and interventions completed:  Yes  SDOH Interventions Today    Flowsheet Row Most Recent Value  SDOH Interventions   Food Insecurity Interventions Intervention Not Indicated  Housing Interventions Intervention Not Indicated  Transportation Interventions Intervention Not Indicated        Care Coordination Interventions:  Yes, provided   Follow up plan: No further intervention required.   Encounter Outcome:  Pt. Visit Completed    Bary Leriche, RN, MSN [facility] Care Management Care Management Coordinator Direct Line [phone]

## 2022-12-03 NOTE — Assessment & Plan Note (Signed)
Follow-up with the nephrologist ?

## 2022-12-03 NOTE — Assessment & Plan Note (Addendum)
-  I previously saw her in 06/2016, at which time her hgb was up to 10.4 and her ferritin and iron levels were normal. Since that time, her hgb has been trending down, now in the 9 range. Repeated iron studies were all normal. No high suspicion for MDS  -she is taking oral iron and vit B12. -Her anemia is likely secondary to CKD  -We discussed the benefit on the risks of Epo injection, especially the risk of thrombosis, including heart attack and stroke. She had a history of DVT in the past. I started her on Aranesp q2weeks injections on 03/24/21. -she was recently hospitalized for pneumonia in 10/2022, anemia got worse, will slightly increase Aranesp dose

## 2022-12-04 ENCOUNTER — Encounter: Payer: Self-pay | Admitting: Hematology

## 2022-12-04 ENCOUNTER — Inpatient Hospital Stay: Payer: Medicare Other | Attending: Hematology

## 2022-12-04 ENCOUNTER — Other Ambulatory Visit: Payer: Self-pay

## 2022-12-04 ENCOUNTER — Inpatient Hospital Stay: Payer: Medicare Other

## 2022-12-04 ENCOUNTER — Inpatient Hospital Stay: Payer: Medicare Other | Attending: Hematology | Admitting: Hematology

## 2022-12-04 ENCOUNTER — Other Ambulatory Visit: Payer: Self-pay | Admitting: Family

## 2022-12-04 VITALS — BP 164/59 | HR 65 | Temp 98.3°F | Resp 16 | Wt 143.8 lb

## 2022-12-04 VITALS — BP 140/60

## 2022-12-04 DIAGNOSIS — F419 Anxiety disorder, unspecified: Secondary | ICD-10-CM | POA: Diagnosis not present

## 2022-12-04 DIAGNOSIS — Z7902 Long term (current) use of antithrombotics/antiplatelets: Secondary | ICD-10-CM | POA: Diagnosis not present

## 2022-12-04 DIAGNOSIS — Z90710 Acquired absence of both cervix and uterus: Secondary | ICD-10-CM | POA: Diagnosis not present

## 2022-12-04 DIAGNOSIS — Z86718 Personal history of other venous thrombosis and embolism: Secondary | ICD-10-CM | POA: Insufficient documentation

## 2022-12-04 DIAGNOSIS — Z886 Allergy status to analgesic agent status: Secondary | ICD-10-CM | POA: Insufficient documentation

## 2022-12-04 DIAGNOSIS — Z8701 Personal history of pneumonia (recurrent): Secondary | ICD-10-CM | POA: Insufficient documentation

## 2022-12-04 DIAGNOSIS — J45909 Unspecified asthma, uncomplicated: Secondary | ICD-10-CM | POA: Diagnosis not present

## 2022-12-04 DIAGNOSIS — Z79899 Other long term (current) drug therapy: Secondary | ICD-10-CM | POA: Diagnosis not present

## 2022-12-04 DIAGNOSIS — D638 Anemia in other chronic diseases classified elsewhere: Secondary | ICD-10-CM

## 2022-12-04 DIAGNOSIS — D631 Anemia in chronic kidney disease: Secondary | ICD-10-CM | POA: Diagnosis not present

## 2022-12-04 DIAGNOSIS — Z880 Allergy status to penicillin: Secondary | ICD-10-CM | POA: Insufficient documentation

## 2022-12-04 DIAGNOSIS — E785 Hyperlipidemia, unspecified: Secondary | ICD-10-CM | POA: Diagnosis not present

## 2022-12-04 DIAGNOSIS — N183 Chronic kidney disease, stage 3 unspecified: Secondary | ICD-10-CM | POA: Diagnosis not present

## 2022-12-04 DIAGNOSIS — I129 Hypertensive chronic kidney disease with stage 1 through stage 4 chronic kidney disease, or unspecified chronic kidney disease: Secondary | ICD-10-CM | POA: Diagnosis not present

## 2022-12-04 LAB — CBC WITH DIFFERENTIAL (CANCER CENTER ONLY)
Abs Immature Granulocytes: 0.07 10*3/uL (ref 0.00–0.07)
Basophils Absolute: 0 10*3/uL (ref 0.0–0.1)
Basophils Relative: 1 %
Eosinophils Absolute: 0.1 10*3/uL (ref 0.0–0.5)
Eosinophils Relative: 2 %
HCT: 32.7 % — ABNORMAL LOW (ref 36.0–46.0)
Hemoglobin: 10 g/dL — ABNORMAL LOW (ref 12.0–15.0)
Immature Granulocytes: 2 %
Lymphocytes Relative: 39 %
Lymphs Abs: 1.6 10*3/uL (ref 0.7–4.0)
MCH: 29.8 pg (ref 26.0–34.0)
MCHC: 30.6 g/dL (ref 30.0–36.0)
MCV: 97.3 fL (ref 80.0–100.0)
Monocytes Absolute: 0.3 10*3/uL (ref 0.1–1.0)
Monocytes Relative: 8 %
Neutro Abs: 2 10*3/uL (ref 1.7–7.7)
Neutrophils Relative %: 48 %
Platelet Count: 198 10*3/uL (ref 150–400)
RBC: 3.36 MIL/uL — ABNORMAL LOW (ref 3.87–5.11)
RDW: 14.6 % (ref 11.5–15.5)
WBC Count: 4.1 10*3/uL (ref 4.0–10.5)
nRBC: 0 % (ref 0.0–0.2)

## 2022-12-04 MED ORDER — DARBEPOETIN ALFA 100 MCG/0.5ML IJ SOSY
100.0000 ug | PREFILLED_SYRINGE | Freq: Once | INTRAMUSCULAR | Status: AC
  Administered 2022-12-04: 100 ug via SUBCUTANEOUS
  Filled 2022-12-04: qty 0.5

## 2022-12-04 NOTE — Progress Notes (Signed)
[facility]   Telephone:([phone]) [phone] [phone]   Clinic Follow up Note   Patient Care Team: Panosh, Neta Mends, MD as PCP - General (Internal Medicine) Nahser, Deloris Ping, MD as PCP - Cardiology (Cardiology) August Saucer, Corrie Mckusick, MD (Orthopedic Surgery) Malachy Mood, MD as Consulting Physician (Hematology) Bufford Buttner, MD as Consulting Physician (Nephrology) Mateo Flow, MD as Consulting Physician (Ophthalmology) Eber Jones, MD as Referring Physician (Ophthalmology) Leslye Peer, MD as Consulting Physician (Pulmonary Disease) Sherrill Raring, [facility] (Inactive) (Pharmacist)  Date of Service:  12/04/2022  CHIEF COMPLAINT: f/u of anemia of chronic disease     CURRENT THERAPY:  Aranesp injections, q2weeks, starting 03/24/21, will change to q6weeks starting 07/18/21, hold if Hg>=11.0    ASSESSMENT:  [name] is a 89 y.o. female with   Anemia of chronic disease -I previously saw her in 06/2016, at which time her hgb was up to 10.4 and her ferritin and iron levels were normal. Since that time, her hgb has been trending down, now in the 9 range. Repeated iron studies were all normal. No high suspicion for MDS  -she is taking oral iron and vit B12. -Her anemia is likely secondary to CKD  -We discussed the benefit on the risks of Epo injection, especially the risk of thrombosis, including heart attack and stroke. She had a history of DVT in the past. I started her on Aranesp q2weeks injections on 03/24/21. -she was recently hospitalized for pneumonia in 10/2022, anemia got worse, but improved now, will continue Aranesp at every 4 weeks   CKD (chronic kidney disease) stage 3, GFR 30-59 ml/min (HCC) -Follow-up with the nephrologist       PLAN: -lab reviewed  -Continue to monitor labs monthly -Continue with dose of the Aranesp injection 150 mcg -recommend her to elevated her legs due to swelling. -lab and f/u in 4  months    INTERVAL HISTORY:  [name] is here for a follow up of anemia of chronic disease . She was last seen by me on 06/08/2022 She presents to the clinic accompanied by granddaughter. Pt state that she had issues with her Asthma. Pt report of having pneumonia and she was hospitalized for 7 days.  All other systems were reviewed with the patient and are negative.  MEDICAL HISTORY:  Past Medical History:  Diagnosis Date   Acute superficial venous thrombosis of left lower extremity 03/27/2014   concern about hx and potential extensive on exam tenderness calf and low dose lovenox 40 qd 2-4 weekselevetion and close  fu advised .    Allergy    Anemia    Anxiety    Arthritis    "shoulders" (09/09/2012)   Asthma 09/08/2012   Carotid bruit    [place] 2018  low risk 1 - 39%   Cataract    bil cateracts removed   Chronic kidney disease    Clotting disorder (HCC)    blood clots in legs   Diabetes mellitus without complication (HCC)    "Borderline" per pt; being monitored.  no meds per pt   Diverticulosis    Dyspnea 04/27/2014   GERD (gastroesophageal reflux disease)    Headache(784.0)    "related to my high blood pressure" (09/09/2012)   Hiatal hernia    History of DVT (deep vein thrombosis)    "RLE" (09/09/2012) coumadine cant take asa so on plavix    HTN (hypertension) 09/08/2012   Hyperlipidemia    Lupus (HCC)    "  cured years ago" (09/09/2012)   Other dysphagia 02/11/2013   Syncope and collapse 01/21/2018   Varicose veins    Varicose veins of leg with complications 12/05/2010    SURGICAL HISTORY: Past Surgical History:  Procedure Laterality Date   ABDOMINAL HYSTERECTOMY     partial   BREAST EXCISIONAL BIOPSY Right    BREAST SURGERY     CARPAL TUNNEL RELEASE Right 2000's   CARPAL TUNNEL RELEASE Bilateral 10/21/2013   Procedure: BILATERAL  CARPAL TUNNEL RELEASE;  Surgeon: Nicki Reaper, MD;  Location: [facility];  Service: Orthopedics;  Laterality:  Bilateral;   COLONOSCOPY     SHOULDER OPEN ROTATOR CUFF REPAIR Bilateral 2000's   TRIGGER FINGER RELEASE Right 10/21/2013   Procedure: RELEASE TRIGGER FINGER/A-1 PULLEY RIGHT MIDDLE AND RIGHT RING;  Surgeon: Nicki Reaper, MD;  Location:  SURGERY CENTER;  Service: Orthopedics;  Laterality: Right;   UPPER GASTROINTESTINAL ENDOSCOPY      I have reviewed the social history and family history with the patient and they are unchanged from previous note.  ALLERGIES:  is allergic to penicillins and aspirin.  MEDICATIONS:  Current Outpatient Medications  Medication Sig Dispense Refill   acetaminophen (TYLENOL) 500 MG tablet Take 500 mg by mouth in the morning and at bedtime.     albuterol (PROVENTIL) (2.5 MG/3ML) 0.083% nebulizer solution Take 3 mLs (2.5 mg total) by nebulization every 6 (six) hours as needed for shortness of breath or wheezing. (Patient taking differently: Take 2.5 mg by nebulization See admin instructions. Nebulize 2.5 mg and inhale into the lungs in the morning and at bedtime- may use an additional 2 times a day as needed for shortness of breath or wheezing) 360 mL 6   albuterol (VENTOLIN HFA) 108 (90 Base) MCG/ACT inhaler USE 2 PUFFS EVERY 6 HOURS AS NEEDED FOR WHEEZING. (Patient taking differently: Inhale 2 puffs into the lungs every 6 (six) hours as needed for wheezing or shortness of breath.) 8.5 g 3   amLODipine (NORVASC) 10 MG tablet Take 1 tablet (10 mg total) by mouth daily. 30 tablet 1   atorvastatin (LIPITOR) 40 MG tablet Take 1 tablet (40 mg total) by mouth daily. 90 tablet 3   atropine 1 % ophthalmic solution Place 1 drop into both eyes 2 (two) times daily.     Azelastine-Fluticasone 137-50 MCG/ACT SUSP PLACE ONE SPRAY IN EACH NOSTRIL TWICE DAILY (IN THE MORNING AND AT BEDTIME) (Patient taking differently: Place 1 spray into both nostrils in the morning and at bedtime.) 23 g 6   Budeson-Glycopyrrol-Formoterol (BREZTRI AEROSPHERE) 160-9-4.8 MCG/ACT AERO INHALE 2  PUFFS INTO THE LUNGS TWICE DAILY, IN THE MORNING & AT BEDTIME. (Patient taking differently: Inhale 2 puffs into the lungs in the morning and at bedtime.) 10.7 g 4   carvedilol (COREG) 12.5 MG tablet Take 1 tablet (12.5 mg total) by mouth 2 (two) times daily with a meal. 60 tablet 1   cetirizine (ZYRTEC) 10 MG tablet Take 10 mg by mouth daily as needed for allergies or rhinitis.     citalopram (CELEXA) 20 MG tablet Take 1 tablet (20 mg total) by mouth every evening. 90 tablet 0   clopidogrel (PLAVIX) 75 MG tablet TAKE ONE TABLET BY MOUTH ONCE DAILY. (Patient taking differently: Take 75 mg by mouth in the morning.) 30 tablet 1   diclofenac Sodium (VOLTAREN) 1 % GEL Apply 4 g topically 2 (two) times daily as needed (painful areas). 50 g 1   doxazosin (CARDURA) 4 MG  tablet Take 1 tablet (4 mg total) by mouth every 12 (twelve) hours. 60 tablet 1   ferrous sulfate 325 (65 FE) MG EC tablet Take 1 tablet (325 mg total) by mouth daily with breakfast. 30 tablet 3   fluticasone (FLONASE) 50 MCG/ACT nasal spray Place 2 sprays into both nostrils daily. (Patient taking differently: Place 2 sprays into both nostrils daily as needed for allergies or rhinitis.) 16 g 2   folic acid (FOLVITE) 1 MG tablet Take 1 tablet (1 mg total) by mouth daily. 30 tablet 1   furosemide (LASIX) 40 MG tablet Take 40-80 mg by mouth See admin instructions. Take 80 mg by mouth in the morning and 40 mg in the early evening     hydrALAZINE (APRESOLINE) 100 MG tablet Take 1 tablet (100 mg total) by mouth 3 (three) times daily. 90 tablet 1   LORazepam (ATIVAN) 0.5 MG tablet Take 1 tablet (0.5 mg total) by mouth 2 (two) times daily as needed. for anxiety 30 tablet 0   montelukast (SINGULAIR) 10 MG tablet TAKE ONE TABLET BY MOUTH ONCE DAILY (Patient taking differently: Take 10 mg by mouth at bedtime.) 90 tablet 0   ondansetron (ZOFRAN-ODT) 8 MG disintegrating tablet 8mg  ODT q4 hours prn nausea (Patient taking differently: Take 8 mg by mouth every  4 (four) hours as needed for nausea (DISSOLVE ORALLY).) 20 tablet 0   pantoprazole (PROTONIX) 40 MG tablet TAKE ONE TABLET BY MOUTH TWICE DAILY (Patient taking differently: Take 40 mg by mouth 2 (two) times daily before a meal.) 180 tablet 1   potassium chloride (KLOR-CON M) 10 MEQ tablet Take 1 tablet (10 mEq total) by mouth daily. 30 tablet 1   RESTASIS 0.05 % ophthalmic emulsion Place 1 drop into both eyes 2 (two) times daily.     sodium bicarbonate 650 MG tablet Take 1 tablet (650 mg total) by mouth 2 (two) times daily. 60 tablet 1   Spacer/Aero-Holding Chambers (AEROCHAMBER PLUS) inhaler Use as instructed 1 each 2   vitamin B-12 (CYANOCOBALAMIN) 1000 MCG tablet Take 1,000 mcg by mouth daily.     Vitamin D, Cholecalciferol, 25 MCG (1000 UT) TABS Take 1,000 Units by mouth daily.     No current facility-administered medications for this visit.    PHYSICAL EXAMINATION: ECOG PERFORMANCE STATUS: 1 - Symptomatic but completely ambulatory  Vitals:   12/04/22 1205  BP: (!) 164/59  Pulse: 65  Resp: 16  Temp: 98.3 F (36.8 C)  SpO2: 99%   Wt Readings from Last 3 Encounters:  12/04/22 143 lb 12.8 oz (65.2 kg)  11/14/22 142 lb (64.4 kg)  11/06/22 148 lb (67.1 kg)    GENERAL:alert, no distress and comfortable SKIN: skin color, texture, turgor are normal, no rashes or significant lesions EYES: normal, Conjunctiva are pink and non-injected, sclera clear LABORATORY DATA:  I have reviewed the data as listed    Latest Ref Rng & Units 12/04/2022   11:37 AM 11/05/2022    5:39 AM 11/04/2022    5:49 AM  CBC  WBC 4.0 - 10.5 K/uL 4.1  7.7  7.6   Hemoglobin 12.0 - 15.0 g/dL 56.2  7.9  7.7   Hematocrit 36.0 - 46.0 % 32.7  26.1  25.4   Platelets 150 - 400 K/uL 198  225  215         Latest Ref Rng & Units 11/05/2022    5:39 AM 11/04/2022    5:49 AM 11/03/2022    5:44 AM  CMP  Glucose 70 - 99 mg/dL 782  99  956   BUN 8 - 23 mg/dL 55  58  59   Creatinine 0.44 - 1.00 mg/dL 2.13  0.86  5.78    Sodium 135 - 145 mmol/L 139  140  137   Potassium 3.5 - 5.1 mmol/L 4.2  4.3  4.4   Chloride 98 - 111 mmol/L 108  106  106   CO2 22 - 32 mmol/L 21  19  21    Calcium 8.9 - 10.3 mg/dL 8.2  8.4  8.1       RADIOGRAPHIC STUDIES: I have personally reviewed the radiological images as listed and agreed with the findings in the report. No results found.    No orders of the defined types were placed in this encounter.  All questions were answered. The patient knows to call the clinic with any problems, questions or concerns. No barriers to learning was detected. The total time spent in the appointment was 20 minutes.     Malachy Mood, MD 12/04/2022   Carolin Coy, CMA, am acting as scribe for Malachy Mood, MD.   I have reviewed the above documentation for accuracy and completeness, and I agree with the above.

## 2022-12-08 ENCOUNTER — Encounter (HOSPITAL_COMMUNITY): Payer: Self-pay | Admitting: Cardiology

## 2022-12-08 ENCOUNTER — Ambulatory Visit (HOSPITAL_COMMUNITY)
Admission: RE | Admit: 2022-12-08 | Discharge: 2022-12-08 | Disposition: A | Discharge status: Home / Self Care | Payer: Medicare Other | Source: Ambulatory Visit | Attending: Cardiology | Admitting: Cardiology

## 2022-12-08 VITALS — BP 148/70 | HR 60 | Wt 143.0 lb

## 2022-12-08 DIAGNOSIS — I5033 Acute on chronic diastolic (congestive) heart failure: Secondary | ICD-10-CM

## 2022-12-08 DIAGNOSIS — N183 Chronic kidney disease, stage 3 unspecified: Secondary | ICD-10-CM

## 2022-12-08 DIAGNOSIS — I130 Hypertensive heart and chronic kidney disease with heart failure and stage 1 through stage 4 chronic kidney disease, or unspecified chronic kidney disease: Secondary | ICD-10-CM | POA: Diagnosis not present

## 2022-12-08 DIAGNOSIS — N189 Chronic kidney disease, unspecified: Secondary | ICD-10-CM | POA: Diagnosis not present

## 2022-12-08 LAB — BASIC METABOLIC PANEL
Anion gap: 10 (ref 5–15)
BUN: 43 mg/dL — ABNORMAL HIGH (ref 8–23)
CO2: 26 mmol/L (ref 22–32)
Calcium: 8.6 mg/dL — ABNORMAL LOW (ref 8.9–10.3)
Chloride: 105 mmol/L (ref 98–111)
Creatinine, Ser: 3.68 mg/dL — ABNORMAL HIGH (ref 0.44–1.00)
GFR, Estimated: 11 mL/min — ABNORMAL LOW (ref >60)
Glucose, Bld: 119 mg/dL — ABNORMAL HIGH (ref 70–99)
Potassium: 3.8 mmol/L (ref 3.5–5.1)
Sodium: 141 mmol/L (ref 135–145)

## 2022-12-08 LAB — BRAIN NATRIURETIC PEPTIDE: B Natriuretic Peptide: 272.5 pg/mL — ABNORMAL HIGH (ref 0.0–100.0)

## 2022-12-08 MED ORDER — METOLAZONE 2.5 MG PO TABS: 2.5000 mg | ORAL_TABLET | ORAL | 3 refills | Status: DC

## 2022-12-08 MED ORDER — FUROSEMIDE 40 MG PO TABS: ORAL_TABLET | ORAL | 4 refills | Status: DC

## 2022-12-08 NOTE — Patient Instructions (Signed)
TAKE Metolazone 2.5 mg ( 1 Tab) for the next 3 days.  CHANGE Lasix to 80 mg in the morning and 60 mg in the evening  You have been ordered a PYP Scan.  This is done in the Radiology Department of [facility].  When you come for this test please plan to be there 2-3 hours.  Your physician recommends that you schedule a follow-up appointment as scheduled  If you have any questions or concerns before your next appointment please send [place] a message through [place] or call our office at [phone].    TO LEAVE A MESSAGE FOR THE NURSE SELECT OPTION 2, PLEASE LEAVE A MESSAGE INCLUDING: YOUR NAME DATE OF BIRTH CALL BACK NUMBER REASON FOR CALL**this is important as we prioritize the call backs  YOU WILL RECEIVE A CALL BACK THE SAME DAY AS LONG AS YOU CALL BEFORE 4:00 PM  At the [facility], you and your health needs are our priority. As part of our continuing mission to provide you with exceptional heart care, we have created designated Provider Care Teams. These Care Teams include your primary Cardiologist (physician) and Advanced Practice Providers (APPs- Physician Assistants and Nurse Practitioners) who all work together to provide you with the care you need, when you need it.   You may see any of the following providers on your designated Care Team at your next follow up: Dr Arvilla Meres Dr Marca Ancona Dr. Marcos Eke, NP Robbie Lis, [place] [facility] [place], [place] Brynda Peon, NP Karle Plumber, PharmD   Please be sure to bring in all your medications bottles to every appointment.    Thank you for choosing [facility]

## 2022-12-08 NOTE — Progress Notes (Signed)
[facility] NOTE  Referring Physician: [name], MD  Primary Care: [name], MD Primary Cardiologist: Sherilyn Banker, MD  HPI: [name] is a 89 y.o. female with heart failure with preserved ejection fraction, moderate persistent asthma, anemia of chronic disease followed by hematology, CKD 4, hyperlipidemia, hypertension presenting today for evaluation of pulmonary hypertension and persistent volume overload secondary to chronic HFpEF and underlying CKD.  At home she goes on walks for a few minutes daily. She continues to drive and perform all ADLs independently including cooking, washing dishes etc. However, she is limited by knee osteoarthritis and dyspnea.   Activity level/exercise tolerance:  NYHA III, limited by dyspnea and frailty.  Orthopnea:  Sleeps on 3 pillows Paroxysmal noctural dyspnea:  Yes, several times per week Chest pain/pressure:  no Orthostatic lightheadedness:  no Palpitations:  no Lower extremity edema:  yes 3+ to the knees.  Presyncope/syncope: no Cough:  no  Past Medical History:  Diagnosis Date   Acute superficial venous thrombosis of left lower extremity 03/27/2014   concern about hx and potential extensive on exam tenderness calf and low dose lovenox 40 qd 2-4 weekselevetion and close  fu advised .    Allergy    Anemia    Anxiety    Arthritis    "shoulders" (09/09/2012)   Asthma 09/08/2012   Carotid bruit    [place] 2018  low risk 1 - 39%   Cataract    bil cateracts removed   Chronic kidney disease    Clotting disorder (HCC)    blood clots in legs   Diabetes mellitus without complication (HCC)    "Borderline" per pt; being monitored.  no meds per pt   Diverticulosis    Dyspnea 04/27/2014   GERD (gastroesophageal reflux disease)    Headache(784.0)    "related to my high blood pressure" (09/09/2012)   Hiatal hernia    History of DVT (deep vein thrombosis)    "RLE" (09/09/2012) coumadine cant take asa so on  plavix    HTN (hypertension) 09/08/2012   Hyperlipidemia    Lupus (HCC)    "cured years ago" (09/09/2012)   Other dysphagia 02/11/2013   Syncope and collapse 01/21/2018   Varicose veins    Varicose veins of leg with complications 12/05/2010    Current Outpatient Medications  Medication Sig Dispense Refill   acetaminophen (TYLENOL) 500 MG tablet Take 500 mg by mouth in the morning and at bedtime.     albuterol (PROVENTIL) (2.5 MG/3ML) 0.083% nebulizer solution Take 3 mLs (2.5 mg total) by nebulization every 6 (six) hours as needed for shortness of breath or wheezing. 360 mL 6   albuterol (VENTOLIN HFA) 108 (90 Base) MCG/ACT inhaler USE 2 PUFFS EVERY 6 HOURS AS NEEDED FOR WHEEZING. 8.5 g 3   amLODipine (NORVASC) 10 MG tablet Take 1 tablet (10 mg total) by mouth daily. 30 tablet 1   atorvastatin (LIPITOR) 40 MG tablet Take 1 tablet (40 mg total) by mouth daily. 90 tablet 3   atropine 1 % ophthalmic solution Place 1 drop into both eyes 2 (two) times daily.     Azelastine-Fluticasone 137-50 MCG/ACT SUSP PLACE ONE SPRAY IN EACH NOSTRIL TWICE DAILY (IN THE MORNING AND AT BEDTIME) 23 g 6   Budeson-Glycopyrrol-Formoterol (BREZTRI AEROSPHERE) 160-9-4.8 MCG/ACT AERO INHALE 2 PUFFS INTO THE LUNGS TWICE DAILY, IN THE MORNING & AT BEDTIME. 10.7 g 4   carvedilol (COREG) 12.5 MG tablet Take 1 tablet (12.5 mg total) by  mouth 2 (two) times daily with a meal. 60 tablet 1   cetirizine (ZYRTEC) 10 MG tablet Take 10 mg by mouth daily as needed for allergies or rhinitis.     citalopram (CELEXA) 20 MG tablet Take 1 tablet (20 mg total) by mouth every evening. 90 tablet 0   clopidogrel (PLAVIX) 75 MG tablet TAKE ONE TABLET BY MOUTH ONCE DAILY. 30 tablet 1   diclofenac Sodium (VOLTAREN) 1 % GEL Apply 4 g topically 2 (two) times daily as needed (painful areas). 50 g 1   doxazosin (CARDURA) 4 MG tablet Take 1 tablet (4 mg total) by mouth every 12 (twelve) hours. 60 tablet 1   ferrous sulfate 325 (65 FE) MG EC tablet  Take 1 tablet (325 mg total) by mouth daily with breakfast. 30 tablet 3   fluticasone (FLONASE) 50 MCG/ACT nasal spray Place 2 sprays into both nostrils as needed for allergies or rhinitis.     folic acid (FOLVITE) 1 MG tablet Take 1 tablet (1 mg total) by mouth daily. 30 tablet 1   hydrALAZINE (APRESOLINE) 100 MG tablet Take 1 tablet (100 mg total) by mouth 3 (three) times daily. 90 tablet 1   LORazepam (ATIVAN) 0.5 MG tablet Take 1 tablet (0.5 mg total) by mouth 2 (two) times daily as needed. for anxiety 30 tablet 0   montelukast (SINGULAIR) 10 MG tablet TAKE ONE TABLET BY MOUTH ONCE DAILY 90 tablet 0   pantoprazole (PROTONIX) 40 MG tablet TAKE ONE TABLET BY MOUTH TWICE DAILY 180 tablet 1   potassium chloride (KLOR-CON M) 10 MEQ tablet Take 1 tablet (10 mEq total) by mouth daily. 30 tablet 1   RESTASIS 0.05 % ophthalmic emulsion Place 1 drop into both eyes 2 (two) times daily.     sodium bicarbonate 650 MG tablet Take 1 tablet (650 mg total) by mouth 2 (two) times daily. 60 tablet 1   Spacer/Aero-Holding Chambers (AEROCHAMBER PLUS) inhaler Use as instructed (Patient taking differently: Use as instructed as needed) 1 each 2   vitamin B-12 (CYANOCOBALAMIN) 1000 MCG tablet Take 1,000 mcg by mouth daily.     Vitamin D, Cholecalciferol, 25 MCG (1000 UT) TABS Take 1,000 Units by mouth daily.     furosemide (LASIX) 40 MG tablet Take 2 tablets (80 mg total) by mouth every morning AND 1.5 tablets (60 mg total) every evening. 180 tablet 4   No current facility-administered medications for this encounter.    Allergies  Allergen Reactions   Penicillins Nausea And Vomiting and Other (See Comments)    Has patient had a PCN reaction causing immediate rash, facial/tongue/throat swelling, SOB or lightheadedness with hypotension: Y Has patient had a PCN reaction causing severe rash involving mucus membranes or skin necrosis: Y Has patient had a PCN reaction that required hospitalization: N Has patient had a  PCN reaction occurring within the last 10 years: N If all of the above answers are "NO", then may proceed with Cephalosporin use.    Aspirin Other (See Comments)    Wheezing Acetaminophen is OK       Social History   Socioeconomic History   Marital status: Single    Spouse name: Not on file   Number of children: 6   Years of education: Not on file   Highest education level: Not on file  Occupational History   Occupation: retired  Tobacco Use   Smoking status: Never   Smokeless tobacco: Never  Vaping Use   Vaping status: Never Used  Substance  and Sexual Activity   Alcohol use: No   Drug use: No   Sexual activity: Not Currently    Birth control/protection: Surgical  Other Topics Concern   Not on file  Social History Narrative   2 people living in the home.  Grand daughter.   Up and down through the night      Had 7 children  2 deceased . 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    works 3 days per week currently .   Glasses dentures  Neg tad    Social Determinants of Health   Financial Resource Strain: Low Risk  (04/14/2022)   Overall Financial Resource Strain (CARDIA)    Difficulty of Paying Living Expenses: Not very hard  Food Insecurity: No Food Insecurity (12/01/2022)   Hunger Vital Sign    Worried About Running Out of Food in the Last Year: Never true    Ran Out of Food in the Last Year: Never true  Transportation Needs: No Transportation Needs (12/01/2022)   PRAPARE - Administrator, Civil Service (Medical): No    Lack of Transportation (Non-Medical): No  Physical Activity: Not on file  Stress: Not on file  Social Connections: Not on file  Intimate Partner Violence: Not At Risk (10/31/2022)   Humiliation, Afraid, Rape, and Kick questionnaire    Fear of Current or Ex-Partner: No    Emotionally Abused: No    Physically Abused: No    Sexually Abused: No      Family  History  Problem Relation Age of Onset   Hypertension Mother    Lung cancer Mother    Hypertension Father    Lung cancer Father    Breast cancer Sister    Colon cancer Sister        ? 53' s dx - died in 44's   Breast cancer Sister    Hypertension Brother    Rectal cancer Brother    Stomach cancer Brother    Breast cancer Brother    Heart attack Daughter    Esophageal cancer Daughter    Hypertension Daughter    Diabetes Daughter    Breast cancer Paternal Aunt    Lung cancer Other        both parents    Pancreatic cancer Neg Hx     PHYSICAL EXAM: Vitals:   12/08/22 1414  BP: (!) 148/70  Pulse: 60  SpO2: 97%   GENERAL: chronically ill appearing AAF in NAD HEENT: Negative for arcus senilis or xanthelasma. There is no scleral icterus.  The mucous membranes are pink and moist.   NECK: Supple, No masses. Normal carotid upstrokes without bruits. No masses or thyromegaly.    CHEST: There are no chest wall deformities. There is no chest wall tenderness. Respirations are unlabored.  Lungs- decreased at bases CARDIAC:  JVP: 10-12 cm H2O         Normal S1, S2  Normal rate with regular rhythm. No murmurs, rubs or gallops.  Pulses are 2+ and symmetrical in upper and lower extremities. 3-4+ edema to the knees.  ABDOMEN: Soft, non-tender, non-distended. There are no masses or hepatomegaly. There are normal bowel sounds.  EXTREMITIES: Warm and well perfused with no cyanosis, clubbing.  LYMPHATIC: No axillary or supraclavicular lymphadenopathy.  NEUROLOGIC: Patient is oriented x3 with no focal or lateralizing neurologic deficits.  PSYCH: Patients affect is appropriate, there is  no evidence of anxiety or depression.  SKIN: Warm and dry; no lesions or wounds.   DATA REVIEW  ECG: 10/31/22: NSR, borderline low voltage limb leads as per my read  ECHO: 10/31/22: LVEF 60%, normal RV function. Moderate LVH, PASP as per my personal interpretation 11/14/18: LVEF 60-65%, normal RV function;  PASP .    ASSESSMENT & PLAN:  Heart failure with preserved EF - TTE w/ moderate concentric LVH; based on chart review she has history of bilateral carpal tunnel release in 2015 - Due to underlying HFpEF, CKD, carpal tunnel surgery, borderline low voltage ECG and LVH will obtain myeloma panel & PYP to evaluate for cardiac amyloidosis.   - Hypervolemic on exam today with 3-4+ pitting edema to the knees. Currently taking lasix 80mg  in the morning followed by 40mg  at night.  - Will add on metolazone 2.5mg  for the next 3 days. Increase lasix to 80 in the morning followed by 60 in the late afternoon.   2. Evaluation for PH - RV has normal function and morphology on TTE from 10/31/22. Elevated PASP likely secondary to chronically elevated left sided pressures. At this time will focus on diuretics and management of HFpEF. No indication for invasive hemodynamics and treatment for PH therapy. PASP by TTE unchanged on TTEs dating from 2020-2024.   3. CKD IV - Baseline sCr 3-3.5 with a GFR persistently < 15.  - Will attempt to introduce SGLT2i in the future.   4. Hypertension  - Currently on amlodipine 10mg , coreg 12.5mg , hydralazine 100mg  TID, cardura 4mg   - Renal artery duplex from 11/23/17 negative for RAS - No changes at this time.      Advanced Heart Failure Mechanical Circulatory Support

## 2022-12-12 NOTE — Progress Notes (Signed)
ADVANCED HEART FAILURE CLINIC NOTE  Primary Care: [name], MD Primary Cardiologist: Sherilyn Banker, MD HF Cardiologist: Dr. Gasper Lloyd  HPI: [name] is a 89 y.o. female with heart failure with preserved ejection fraction, moderate persistent asthma, anemia of chronic disease followed by hematology, CKD 4, hyperlipidemia, hypertension. Initially saw Dr. Wayna Chalet 12/08/22 for evaluation of pulmonary hypertension and persistent volume overload secondary to chronic HFpEF and underlying CKD.  She was able to drive and perform all ADLs, limited by knee OA and dyspnea. She was volume overloaded and instructed to increase Lasix to 80/60, and add metolazone 2.5 mg daily x 3 days.  Today she returns for HF follow up with her grand-daughter. Overall feeling fatigued, also with low back pain. She is not SOB with ADLs, but feels fatigued. She has brisk diuresis with 3 days of metolazone use, she did not take KCL supp. Occasional palpitations and dizziness, no falls.  Chronically sleeps on 4 pillows. Denies CP, edema, or PND. Appetite ok. No fever or chills. She is not weighing at home. Taking all medications. Of note, no issues with UTI/yeast infections.   Activity level/exercise tolerance:  NYHA IIb-III Orthopnea:  Sleeps on 4 pillows Paroxysmal noctural dyspnea:  Yes, but improving Chest pain/pressure:  no Orthostatic lightheadedness:  Yes Palpitations:  Yes Lower extremity edema:  yes Presyncope/syncope: No Cough:  No  Past Medical History:  Diagnosis Date   Acute superficial venous thrombosis of left lower extremity 03/27/2014   concern about hx and potential extensive on exam tenderness calf and low dose lovenox 40 qd 2-4 weekselevetion and close  fu advised .    Allergy    Anemia    Anxiety    Arthritis    "shoulders" (09/09/2012)   Asthma 09/08/2012   Carotid bruit    [place] 2018  low risk 1 - 39%   Cataract    bil cateracts removed   Chronic kidney disease    Clotting  disorder (HCC)    blood clots in legs   Diabetes mellitus without complication (HCC)    "Borderline" per pt; being monitored.  no meds per pt   Diverticulosis    Dyspnea 04/27/2014   GERD (gastroesophageal reflux disease)    Headache(784.0)    "related to my high blood pressure" (09/09/2012)   Hiatal hernia    History of DVT (deep vein thrombosis)    "RLE" (09/09/2012) coumadine cant take asa so on plavix    HTN (hypertension) 09/08/2012   Hyperlipidemia    Lupus (HCC)    "cured years ago" (09/09/2012)   Other dysphagia 02/11/2013   Syncope and collapse 01/21/2018   Varicose veins    Varicose veins of leg with complications 12/05/2010    Current Outpatient Medications  Medication Sig Dispense Refill   acetaminophen (TYLENOL) 500 MG tablet Take 500 mg by mouth in the morning and at bedtime.     albuterol (PROVENTIL) (2.5 MG/3ML) 0.083% nebulizer solution Take 3 mLs (2.5 mg total) by nebulization every 6 (six) hours as needed for shortness of breath or wheezing. 360 mL 6   albuterol (VENTOLIN HFA) 108 (90 Base) MCG/ACT inhaler USE 2 PUFFS EVERY 6 HOURS AS NEEDED FOR WHEEZING. 8.5 g 3   amLODipine (NORVASC) 10 MG tablet Take 1 tablet (10 mg total) by mouth daily. 30 tablet 1   atorvastatin (LIPITOR) 40 MG tablet Take 1 tablet (40 mg total) by mouth daily. 90 tablet 3   atropine 1 % ophthalmic solution Place 1 drop  into both eyes 2 (two) times daily.     Azelastine-Fluticasone 137-50 MCG/ACT SUSP PLACE ONE SPRAY IN EACH NOSTRIL TWICE DAILY (IN THE MORNING AND AT BEDTIME) 23 g 6   Budeson-Glycopyrrol-Formoterol (BREZTRI AEROSPHERE) 160-9-4.8 MCG/ACT AERO INHALE 2 PUFFS INTO THE LUNGS TWICE DAILY, IN THE MORNING & AT BEDTIME. 10.7 g 4   carvedilol (COREG) 12.5 MG tablet Take 1 tablet (12.5 mg total) by mouth 2 (two) times daily with a meal. 60 tablet 1   cetirizine (ZYRTEC) 10 MG tablet Take 10 mg by mouth daily as needed for allergies or rhinitis.     citalopram (CELEXA) 20 MG tablet Take 1  tablet (20 mg total) by mouth every evening. 90 tablet 0   clopidogrel (PLAVIX) 75 MG tablet TAKE ONE TABLET BY MOUTH ONCE DAILY. 30 tablet 1   diclofenac Sodium (VOLTAREN) 1 % GEL Apply 4 g topically 2 (two) times daily as needed (painful areas). 50 g 1   doxazosin (CARDURA) 4 MG tablet Take 1 tablet (4 mg total) by mouth every 12 (twelve) hours. 60 tablet 1   ferrous sulfate 325 (65 FE) MG EC tablet Take 1 tablet (325 mg total) by mouth daily with breakfast. 30 tablet 3   fluticasone (FLONASE) 50 MCG/ACT nasal spray Place 2 sprays into both nostrils as needed for allergies or rhinitis.     folic acid (FOLVITE) 1 MG tablet Take 1 tablet (1 mg total) by mouth daily. 30 tablet 1   furosemide (LASIX) 40 MG tablet Take 2 tablets (80 mg total) by mouth every morning AND 1.5 tablets (60 mg total) every evening. 180 tablet 4   hydrALAZINE (APRESOLINE) 100 MG tablet Take 1 tablet (100 mg total) by mouth 3 (three) times daily. 90 tablet 1   LORazepam (ATIVAN) 0.5 MG tablet Take 1 tablet (0.5 mg total) by mouth 2 (two) times daily as needed. for anxiety 30 tablet 0   montelukast (SINGULAIR) 10 MG tablet TAKE ONE TABLET BY MOUTH ONCE DAILY 90 tablet 0   pantoprazole (PROTONIX) 40 MG tablet TAKE ONE TABLET BY MOUTH TWICE DAILY 180 tablet 1   potassium chloride (KLOR-CON M) 10 MEQ tablet Take 1 tablet (10 mEq total) by mouth daily. 30 tablet 1   RESTASIS 0.05 % ophthalmic emulsion Place 1 drop into both eyes 2 (two) times daily.     sodium bicarbonate 650 MG tablet Take 1 tablet (650 mg total) by mouth 2 (two) times daily. 60 tablet 1   Spacer/Aero-Holding Chambers (AEROCHAMBER PLUS) inhaler Use as instructed (Patient taking differently: Use as instructed as needed) 1 each 2   vitamin B-12 (CYANOCOBALAMIN) 1000 MCG tablet Take 1,000 mcg by mouth daily.     Vitamin D, Cholecalciferol, 25 MCG (1000 UT) TABS Take 1,000 Units by mouth daily.     No current facility-administered medications for this encounter.    Allergies  Allergen Reactions   Penicillins Nausea And Vomiting and Other (See Comments)    Has patient had a PCN reaction causing immediate rash, facial/tongue/throat swelling, SOB or lightheadedness with hypotension: Y Has patient had a PCN reaction causing severe rash involving mucus membranes or skin necrosis: Y Has patient had a PCN reaction that required hospitalization: N Has patient had a PCN reaction occurring within the last 10 years: N If all of the above answers are "NO", then may proceed with Cephalosporin use.    Aspirin Other (See Comments)    Wheezing Acetaminophen is OK    Social History   Socioeconomic  History   Marital status: Single    Spouse name: Not on file   Number of children: 6   Years of education: Not on file   Highest education level: Not on file  Occupational History   Occupation: retired  Tobacco Use   Smoking status: Never   Smokeless tobacco: Never  Vaping Use   Vaping status: Never Used  Substance and Sexual Activity   Alcohol use: No   Drug use: No   Sexual activity: Not Currently    Birth control/protection: Surgical  Other Topics Concern   Not on file  Social History Narrative   2 people living in the home.  Grand daughter.   Up and down through the night      Had 7 children  2 deceased . 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    works 3 days per week currently .   Glasses dentures  Neg tad    Social Determinants of Health   Financial Resource Strain: Low Risk  (04/14/2022)   Overall Financial Resource Strain (CARDIA)    Difficulty of Paying Living Expenses: Not very hard  Food Insecurity: No Food Insecurity (12/01/2022)   Hunger Vital Sign    Worried About Running Out of Food in the Last Year: Never true    Ran Out of Food in the Last Year: Never true  Transportation Needs: No Transportation Needs (12/01/2022)   PRAPARE - Doctor, general practice (Medical): No    Lack of Transportation (Non-Medical): No  Physical Activity: Not on file  Stress: Not on file  Social Connections: Not on file  Intimate Partner Violence: Not At Risk (10/31/2022)   Humiliation, Afraid, Rape, and Kick questionnaire    Fear of Current or Ex-Partner: No    Emotionally Abused: No    Physically Abused: No    Sexually Abused: No   Family History  Problem Relation Age of Onset   Hypertension Mother    Lung cancer Mother    Hypertension Father    Lung cancer Father    Breast cancer Sister    Colon cancer Sister        ? 40' s dx - died in 31's   Breast cancer Sister    Hypertension Brother    Rectal cancer Brother    Stomach cancer Brother    Breast cancer Brother    Heart attack Daughter    Esophageal cancer Daughter    Hypertension Daughter    Diabetes Daughter    Breast cancer Paternal Aunt    Lung cancer Other        both parents    Pancreatic cancer Neg Hx    BP (!) 172/84   Pulse 61   Wt 61.3 kg (135 lb 3.2 oz)   SpO2 98%   BMI 26.40 kg/m   Wt Readings from Last 3 Encounters:  12/15/22 61.3 kg (135 lb 3.2 oz)  12/08/22 64.9 kg (143 lb)  12/04/22 65.2 kg (143 lb 12.8 oz)   PHYSICAL EXAM: General:  NAD. No resp difficulty, arrived in [facility], elderly HEENT: Normal Neck: Supple. No JVD. Carotids 2+ bilat; no bruits. No lymphadenopathy or thryomegaly appreciated. Cor: PMI nondisplaced. Regular rate & rhythm. No rubs, gallops or murmurs. Lungs: Clear Abdomen: Soft, nontender, nondistended. No hepatosplenomegaly. No bruits or masses. Good bowel sounds. Extremities: No cyanosis, clubbing, rash, 1-2+ pedal edema  Neuro: Alert & oriented x 3, cranial nerves grossly intact. Moves all 4 extremities w/o difficulty. Affect pleasant.  DATA REVIEW  ECG: 10/31/22: NSR, borderline low voltage limb leads as per my read 12/15/22: ST elevation in V2 , (no elevation in contiguous leads) + PVCs, w/ inferior TWIs (old),    ECHO: 10/31/22: LVEF 60%, normal RV function. Moderate LVH, PASP as per my personal interpretation 11/14/18: LVEF 60-65%, normal RV function; PASP .    ASSESSMENT & PLAN:  Heart failure with preserved EF - TTE w/ moderate concentric LVH; based on chart review she has history of bilateral carpal tunnel release in 2015 - Due to underlying HFpEF, CKD, carpal tunnel surgery, borderline low voltage ECG and LVH, work up for cardiac amyloidosis initiated. Myeloma panel negative, PYP pending.  - NYHA IIb-III, functional status limited by general deconditioning and frailty She is not volume overloaded on exam, weight down 8 lbs. ReDs 30%. Suspect pedal edema 2/2 calcium channel blocker. - Continue lasix 80/60 + 10 KCL daily. - She had metolazone at home, discussed PRN use. - Add SGLT2i next visit when appetite improved. - Labs today.  2. Evaluation for PH - RV has normal function and morphology on TTE from 10/31/22. Elevated PASP likely secondary to chronically elevated left sided pressures.  No indication for invasive hemodynamics and treatment for PH therapy. PASP by TTE unchanged on TTEs dating from 2020-2024.  - At this time will focus on diuretics and management of HFpEF.  3. CKD IV - Baseline sCr 3-3.5, with a GFR persistently < 15.  - Will attempt to introduce SGLT2i next visit.   4. Hypertension  - BP elevated today, she has not had her AM meds.  - Continue amlodipine 10 mg daily.  - Continue Coreg 12.5 mg bid. - Continue hydralazine 100 mg tid. - Continue Cardura 4 mg bid. - Renal artery duplex from 11/23/17 negative for RAS - No changes at this time. - BMET today.  5. Abnormal ECG - ECG today shows new elevation in V2, no other elevation in contiguous leads with frequent PVCs (? Hyperkalemia).  - She does not endorse CP but with new ECG changes, she should be evaluated in the ED and HsTroponins trended.  - If admitted, favor Gen Cards.  Likely not LHC cath candidate  with CKD IV and absence of clear ACS.  - Discussed with patient and her grand daughter. Discussed with Dr. Shirlee Latch  Follow up in 3-4 weeks with APP.  Prince Rome, FNP-BC 12/15/22

## 2022-12-15 ENCOUNTER — Encounter (HOSPITAL_COMMUNITY): Payer: Self-pay

## 2022-12-15 ENCOUNTER — Other Ambulatory Visit: Payer: Self-pay

## 2022-12-15 ENCOUNTER — Ambulatory Visit (HOSPITAL_BASED_OUTPATIENT_CLINIC_OR_DEPARTMENT_OTHER)
Admission: RE | Admit: 2022-12-15 | Discharge: 2022-12-15 | Disposition: A | Discharge status: Home / Self Care | Payer: Medicare Other | Source: Ambulatory Visit | Attending: Family Medicine | Admitting: Family Medicine

## 2022-12-15 ENCOUNTER — Emergency Department (HOSPITAL_COMMUNITY)
Admission: EM | Admit: 2022-12-15 | Discharge: 2022-12-15 | Disposition: A | Discharge status: Home / Self Care | Payer: Medicare Other | Attending: Emergency Medicine | Admitting: Emergency Medicine

## 2022-12-15 ENCOUNTER — Emergency Department (HOSPITAL_COMMUNITY): Payer: Medicare Other

## 2022-12-15 VITALS — BP 172/84 | HR 61 | Wt 135.2 lb

## 2022-12-15 DIAGNOSIS — R9431 Abnormal electrocardiogram [ECG] [EKG]: Secondary | ICD-10-CM | POA: Diagnosis not present

## 2022-12-15 DIAGNOSIS — Z8249 Family history of ischemic heart disease and other diseases of the circulatory system: Secondary | ICD-10-CM | POA: Insufficient documentation

## 2022-12-15 DIAGNOSIS — N189 Chronic kidney disease, unspecified: Secondary | ICD-10-CM

## 2022-12-15 DIAGNOSIS — E1122 Type 2 diabetes mellitus with diabetic chronic kidney disease: Secondary | ICD-10-CM | POA: Insufficient documentation

## 2022-12-15 DIAGNOSIS — I272 Pulmonary hypertension, unspecified: Secondary | ICD-10-CM | POA: Insufficient documentation

## 2022-12-15 DIAGNOSIS — D638 Anemia in other chronic diseases classified elsewhere: Secondary | ICD-10-CM | POA: Insufficient documentation

## 2022-12-15 DIAGNOSIS — N184 Chronic kidney disease, stage 4 (severe): Secondary | ICD-10-CM | POA: Insufficient documentation

## 2022-12-15 DIAGNOSIS — Z79899 Other long term (current) drug therapy: Secondary | ICD-10-CM | POA: Insufficient documentation

## 2022-12-15 DIAGNOSIS — Z833 Family history of diabetes mellitus: Secondary | ICD-10-CM | POA: Insufficient documentation

## 2022-12-15 DIAGNOSIS — J45909 Unspecified asthma, uncomplicated: Secondary | ICD-10-CM | POA: Diagnosis not present

## 2022-12-15 DIAGNOSIS — R600 Localized edema: Secondary | ICD-10-CM | POA: Insufficient documentation

## 2022-12-15 DIAGNOSIS — I5032 Chronic diastolic (congestive) heart failure: Secondary | ICD-10-CM

## 2022-12-15 DIAGNOSIS — I130 Hypertensive heart and chronic kidney disease with heart failure and stage 1 through stage 4 chronic kidney disease, or unspecified chronic kidney disease: Secondary | ICD-10-CM | POA: Insufficient documentation

## 2022-12-15 DIAGNOSIS — R5383 Other fatigue: Secondary | ICD-10-CM | POA: Insufficient documentation

## 2022-12-15 DIAGNOSIS — I10 Essential (primary) hypertension: Secondary | ICD-10-CM

## 2022-12-15 DIAGNOSIS — Z86718 Personal history of other venous thrombosis and embolism: Secondary | ICD-10-CM | POA: Diagnosis not present

## 2022-12-15 DIAGNOSIS — I493 Ventricular premature depolarization: Secondary | ICD-10-CM | POA: Insufficient documentation

## 2022-12-15 DIAGNOSIS — I517 Cardiomegaly: Secondary | ICD-10-CM | POA: Diagnosis not present

## 2022-12-15 DIAGNOSIS — E785 Hyperlipidemia, unspecified: Secondary | ICD-10-CM | POA: Diagnosis not present

## 2022-12-15 DIAGNOSIS — D631 Anemia in chronic kidney disease: Secondary | ICD-10-CM | POA: Diagnosis not present

## 2022-12-15 DIAGNOSIS — J4540 Moderate persistent asthma, uncomplicated: Secondary | ICD-10-CM | POA: Insufficient documentation

## 2022-12-15 DIAGNOSIS — I509 Heart failure, unspecified: Secondary | ICD-10-CM | POA: Insufficient documentation

## 2022-12-15 DIAGNOSIS — I129 Hypertensive chronic kidney disease with stage 1 through stage 4 chronic kidney disease, or unspecified chronic kidney disease: Secondary | ICD-10-CM | POA: Diagnosis not present

## 2022-12-15 DIAGNOSIS — R918 Other nonspecific abnormal finding of lung field: Secondary | ICD-10-CM | POA: Diagnosis not present

## 2022-12-15 LAB — BASIC METABOLIC PANEL
Anion gap: 12 (ref 5–15)
Anion gap: 15 (ref 5–15)
BUN: 53 mg/dL — ABNORMAL HIGH (ref 8–23)
BUN: 54 mg/dL — ABNORMAL HIGH (ref 8–23)
CO2: 27 mmol/L (ref 22–32)
CO2: 24 mmol/L (ref 22–32)
Calcium: 8.5 mg/dL — ABNORMAL LOW (ref 8.9–10.3)
Calcium: 8.6 mg/dL — ABNORMAL LOW (ref 8.9–10.3)
Chloride: 100 mmol/L (ref 98–111)
Chloride: 101 mmol/L (ref 98–111)
Creatinine, Ser: 4.25 mg/dL — ABNORMAL HIGH (ref 0.44–1.00)
Creatinine, Ser: 4.18 mg/dL — ABNORMAL HIGH (ref 0.44–1.00)
GFR, Estimated: 10 mL/min — ABNORMAL LOW (ref >60)
GFR, Estimated: 10 mL/min — ABNORMAL LOW (ref >60)
Glucose, Bld: 114 mg/dL — ABNORMAL HIGH (ref 70–99)
Glucose, Bld: 109 mg/dL — ABNORMAL HIGH (ref 70–99)
Potassium: 3.2 mmol/L — ABNORMAL LOW (ref 3.5–5.1)
Potassium: 3.9 mmol/L (ref 3.5–5.1)
Sodium: 139 mmol/L (ref 135–145)
Sodium: 140 mmol/L (ref 135–145)

## 2022-12-15 LAB — BRAIN NATRIURETIC PEPTIDE
B Natriuretic Peptide: 170.5 pg/mL — ABNORMAL HIGH (ref 0.0–100.0)
B Natriuretic Peptide: 163.7 pg/mL — ABNORMAL HIGH (ref 0.0–100.0)

## 2022-12-15 LAB — CBC
HCT: 37.5 % (ref 36.0–46.0)
Hemoglobin: 11.2 g/dL — ABNORMAL LOW (ref 12.0–15.0)
MCH: 29.3 pg (ref 26.0–34.0)
MCHC: 29.9 g/dL — ABNORMAL LOW (ref 30.0–36.0)
MCV: 98.2 fL (ref 80.0–100.0)
Platelets: 188 10*3/uL (ref 150–400)
RBC: 3.82 MIL/uL — ABNORMAL LOW (ref 3.87–5.11)
RDW: 16.2 % — ABNORMAL HIGH (ref 11.5–15.5)
WBC: 4.4 10*3/uL (ref 4.0–10.5)
nRBC: 0 % (ref 0.0–0.2)

## 2022-12-15 LAB — TROPONIN I (HIGH SENSITIVITY)
Troponin I (High Sensitivity): 150 ng/L (ref <18)
Troponin I (High Sensitivity): 154 ng/L (ref <18)

## 2022-12-15 LAB — MAGNESIUM: Magnesium: 1.9 mg/dL (ref 1.7–2.4)

## 2022-12-15 NOTE — ED Provider Notes (Signed)
[place] EMERGENCY DEPARTMENT AT [facility] Provider Note   CSN: [id number] Arrival date & time: 12/15/22  1000     History  Chief Complaint  Patient presents with   Abnormal ECG    [name] is a 89 y.o. female.  HPI 89 yo female with CHF presents today from chf clinic with report of abnormal EKG, patient seen today in [place] for HF clinic.  Patient with known CHF, moderate persistent asthma, anemia of chronic disease, CKD 4, hyperlipidemia, hypertension, recent evaluation for pulmonary hypertension with ongoing volume overload.  She was seen in the office today.  She had no new complaints.  Per the heart failure note heart failure with preserved EF moderate concentric LVH with recent addition of metolazone on top of her Lasix that is dosed at 80 in the milligram and 60 at night.  She is being evaluated for pulmonary hypertension.  They noted abnormal EKG and patient sent to ED.  Patient denies any chest pain or worsening dyspnea.    Home Medications Prior to Admission medications   Medication Sig Start Date End Date Taking? Authorizing Provider  acetaminophen (TYLENOL) 500 MG tablet Take 500 mg by mouth in the morning and at bedtime.    [provider]  albuterol (PROVENTIL) (2.5 MG/3ML) 0.083% nebulizer solution Take 3 mLs (2.5 mg total) by nebulization every 6 (six) hours as needed for shortness of breath or wheezing. 08/03/22   Carlisle Beers, FNP  albuterol (VENTOLIN HFA) 108 (90 Base) MCG/ACT inhaler USE 2 PUFFS EVERY 6 HOURS AS NEEDED FOR WHEEZING. 08/07/22   Leslye Peer, MD  amLODipine (NORVASC) 10 MG tablet Take 1 tablet (10 mg total) by mouth daily. 11/07/22   Osvaldo Shipper, MD  atorvastatin (LIPITOR) 40 MG tablet Take 1 tablet (40 mg total) by mouth daily. 04/21/22   Nahser, Deloris Ping, MD  atropine 1 % ophthalmic solution Place 1 drop into both eyes 2 (two) times daily.    [provider]  Azelastine-Fluticasone 137-50 MCG/ACT SUSP  PLACE ONE SPRAY IN EACH NOSTRIL TWICE DAILY (IN THE MORNING AND AT BEDTIME) 01/19/22   Byrum, Les Pou, MD  Budeson-Glycopyrrol-Formoterol (BREZTRI AEROSPHERE) 160-9-4.8 MCG/ACT AERO INHALE 2 PUFFS INTO THE LUNGS TWICE DAILY, IN THE MORNING & AT BEDTIME. 06/22/22   Byrum, Les Pou, MD  carvedilol (COREG) 12.5 MG tablet Take 1 tablet (12.5 mg total) by mouth 2 (two) times daily with a meal. 11/06/22   Osvaldo Shipper, MD  cetirizine (ZYRTEC) 10 MG tablet Take 10 mg by mouth daily as needed for allergies or rhinitis.    [provider]  citalopram (CELEXA) 20 MG tablet Take 1 tablet (20 mg total) by mouth every evening. 09/12/22   Panosh, Neta Mends, MD  clopidogrel (PLAVIX) 75 MG tablet TAKE ONE TABLET BY MOUTH ONCE DAILY. 10/19/22   Panosh, Neta Mends, MD  diclofenac Sodium (VOLTAREN) 1 % GEL Apply 4 g topically 2 (two) times daily as needed (painful areas). 05/02/22   Ellsworth Lennox, PA-C  doxazosin (CARDURA) 4 MG tablet Take 1 tablet (4 mg total) by mouth every 12 (twelve) hours. 11/06/22   Osvaldo Shipper, MD  ferrous sulfate 325 (65 FE) MG EC tablet Take 1 tablet (325 mg total) by mouth daily with breakfast. 12/15/14   Panosh, Neta Mends, MD  fluticasone (FLONASE) 50 MCG/ACT nasal spray Place 2 sprays into both nostrils as needed for allergies or rhinitis.    [provider]  folic acid (FOLVITE) 1 MG  tablet Take 1 tablet (1 mg total) by mouth daily. 11/07/22   Osvaldo Shipper, MD  furosemide (LASIX) 40 MG tablet Take 2 tablets (80 mg total) by mouth every morning AND 1.5 tablets (60 mg total) every evening. 12/08/22   Sabharwal, Aditya, DO  hydrALAZINE (APRESOLINE) 100 MG tablet Take 1 tablet (100 mg total) by mouth 3 (three) times daily. 11/06/22   Osvaldo Shipper, MD  LORazepam (ATIVAN) 0.5 MG tablet Take 1 tablet (0.5 mg total) by mouth 2 (two) times daily as needed. for anxiety 09/17/22   Panosh, Neta Mends, MD  montelukast (SINGULAIR) 10 MG tablet TAKE ONE TABLET BY MOUTH ONCE DAILY 08/06/22   Worthy Rancher B, FNP  pantoprazole (PROTONIX) 40 MG tablet TAKE ONE TABLET BY MOUTH TWICE DAILY 08/22/22   Panosh, Neta Mends, MD  potassium chloride (KLOR-CON M) 10 MEQ tablet Take 1 tablet (10 mEq total) by mouth daily. 11/06/22   Osvaldo Shipper, MD  RESTASIS 0.05 % ophthalmic emulsion Place 1 drop into both eyes 2 (two) times daily. 10/13/21   [provider]  sodium bicarbonate 650 MG tablet Take 1 tablet (650 mg total) by mouth 2 (two) times daily. 11/06/22   Osvaldo Shipper, MD  Spacer/Aero-Holding Chambers (AEROCHAMBER PLUS) inhaler Use as instructed Patient taking differently: Use as instructed as needed 05/26/16   Domenick Gong, MD  vitamin B-12 (CYANOCOBALAMIN) 1000 MCG tablet Take 1,000 mcg by mouth daily.    [provider]  Vitamin D, Cholecalciferol, 25 MCG (1000 UT) TABS Take 1,000 Units by mouth daily.    [provider]  potassium chloride (KLOR-CON) 10 MEQ tablet TAKE FOUR TABLETS BY MOUTH DAILY. Patient taking differently: Take 20 mEq by mouth 2 (two) times daily. 10/19/22 11/06/22  Panosh, Neta Mends, MD      Allergies    Penicillins and Aspirin    Review of Systems   Review of Systems  Physical Exam Updated Vital Signs BP (!) 179/64   Pulse (!) 54   Temp 97.8 F (36.6 C) (Oral)   Resp 13   Ht 1.524 m (5')   Wt 61.2 kg   SpO2 100%   BMI 26.37 kg/m  Physical Exam Vitals and nursing note reviewed.  Constitutional:      Appearance: Normal appearance.  HENT:     Head: Normocephalic.     Right Ear: External ear normal.     Left Ear: External ear normal.     Nose: Nose normal.     Mouth/Throat:     Pharynx: Oropharynx is clear.  Eyes:     Pupils: Pupils are equal, round, and reactive to light.  Cardiovascular:     Rate and Rhythm: Normal rate and regular rhythm.     Pulses: Normal pulses.  Pulmonary:     Effort: Pulmonary effort is normal.     Breath sounds: Normal breath sounds.  Abdominal:     Palpations: Abdomen is soft.  Musculoskeletal:      Cervical back: Normal range of motion.  Skin:    General: Skin is warm.     Capillary Refill: Capillary refill takes less than 2 seconds.  Neurological:     General: No focal deficit present.     Mental Status: She is alert.  Psychiatric:        Mood and Affect: Mood normal.     ED Results / Procedures / Treatments   Labs (all labs ordered are listed, but only abnormal results are displayed) Labs Reviewed  BASIC  METABOLIC PANEL - Abnormal; Notable for the following components:      Result Value   Glucose, Bld 109 (*)    BUN 54 (*)    Creatinine, Ser 4.18 (*)    Calcium 8.6 (*)    GFR, Estimated 10 (*)    All other components within normal limits  CBC - Abnormal; Notable for the following components:   RBC 3.82 (*)    Hemoglobin 11.2 (*)    MCHC 29.9 (*)    RDW 16.2 (*)    All other components within normal limits  BRAIN NATRIURETIC PEPTIDE - Abnormal; Notable for the following components:   B Natriuretic Peptide 163.7 (*)    All other components within normal limits  TROPONIN I (HIGH SENSITIVITY) - Abnormal; Notable for the following components:   Troponin I (High Sensitivity) 150 (*)    All other components within normal limits  TROPONIN I (HIGH SENSITIVITY) - Abnormal; Notable for the following components:   Troponin I (High Sensitivity) 154 (*)    All other components within normal limits    EKG EKG Interpretation Date/Time:  Friday December 15 2022 09:44:51 EDT Ventricular Rate:  64 PR Interval:  170 QRS Duration:  94 QT Interval:  456 QTC Calculation: 470 R Axis:   104  Text Interpretation: Sinus rhythm with occasional Premature ventricular complexes Rightward axis Septal infarct , age undetermined Abnormal ECG When compared with ECG of 31-Oct-2022 03:06, PREVIOUS ECG IS PRESENT st elevation with changed morphology from [facility] Confirmed by Margarita Grizzle [id number]) on 12/15/2022 1:12:07 PM  Radiology DG Chest 2  View  Result Date: 12/15/2022 CLINICAL DATA:  Abnormal EKG EXAM: CHEST - 2 VIEW COMPARISON:  Chest x- dated November 02, 2022 FINDINGS: Unchanged cardiomegaly and dilated main pulmonary arteries. Decreased right upper lobe consolidation. No evidence of pleural effusion or pneumothorax. IMPRESSION: Decreased right upper lobe consolidation. No new airspace opacity. Recommend additional follow-up PA and lateral chest radiograph in 6-8 weeks to ensure complete resolution. Electronically Signed   By: Allegra Lai M.D.   On: 12/15/2022 11:37    Procedures Procedures    Medications Ordered in ED Medications - No data to display  ED Course/ Medical Decision Making/ A&P Clinical Course as of 12/15/22 1354  Fri Dec 15, 2022  1353 X- reviewed interpreted with improving right upper lobe infiltrate BNP slightly elevated at 163 CBC reviewed interpreted significant for stable anemia hemoglobin 11 Metabolic panel reviewed interpreted significant for increased creatinine 4.18 with known CKD Potassium normal Troponin elevated at 150 and 154 [DR]    Clinical Course User Index [DR] Margarita Grizzle, MD                                 Medical Decision Making Amount and/or Complexity of Data Reviewed Labs: ordered. Radiology: ordered.   89 year old female history of congestive heart failure presents today from CHF clinic with reports of abnormal EKG.  EKG is trended and did have significant ST elevation V1 V2 in clinic.  Here changes have improved and are still present.  Patient without any symptoms at this time troponin here in the ED elevated at 150 with repeat at 154. Patient remains hemodynamically stable Consult placed to cardiology for evaluation. 1 abnormal EKG was concerning for STEMI Elevated troponin which has remained flat 2 CKD with increasing creatinine now 4.18 3 chronic stable anemia  Cardiology to see  and evaluate Care signed out to Dr. Fredderick Phenix who will disposition per cardiology  consult       Final Clinical Impression(s) / ED Diagnoses Final diagnoses:  Abnormal EKG  Chronic kidney disease, unspecified CKD stage    Rx / DC Orders ED Discharge Orders     None         Margarita Grizzle, MD 12/15/22 1536

## 2022-12-15 NOTE — Consult Note (Addendum)
Cardiology Consultation   Patient ID: [name] MRN: [id number]; DOB: 1933/09/11  Admit date: 12/15/2022 Date of Consult: 12/15/2022  PCP:  [name], MD   [facility] Providers Cardiologist:  Kristeen Miss, MD   {  Patient Profile:   [name] is a 89 y.o. female with a history of HFpEF, pulmonary hypertension, hypertension, hyperlipidemia, type 2 diabetes melitis, CKD stage IV, prior DVT, asthma, and anemia of chronic disease who is being seen 12/15/2022 for the evaluation of abnormal EKG at the request of Dr. Rosalia Hammers.    History of Present Illness:   [name] is a 89 year old female with the above history who is followed by Dr. Elease Hashimoto.  Patient has primarily been followed for heart failure. Monitor was ordered in 2019 for further evaluation and showed underlying sinus rhythm with one 5 beat run of NSVT but no significant arrhythmias. Myoview in 11/2018 was low risk with no evidence of ischemia or prior infarction. Echo in 10/2022 showed LVEF of 60-65% with no regional wall motion abnormalities, moderate LHV, and grade 2 diastolic dysfunction as well as mildly enlarged RV with normal RV function and moderately elevated PASP of 49.0 mmHg. Patient was recently referred to the CHF Clinic for pulmonary hypertension and was seen by Dr. Gasper Lloyd on 12/08/2022 and was noted to be volume overloaded. Pulmonary hypertension was felt to like be due to CHF.  Lasix was increased and she was started on 3 days of Metolazone. PYP scan was also ordered. She was seen in the CHF Clinic by [facility], NP, today for follow-up at which time she had brisk diuresis with the Metolazone. She reported feeling fatigue and also reported occasional palpitations and dizziness but but otherwise stable from a cardiac standpoint. However, she was noted to have an abnormal EKG with more prominent ST elevation in V2. She was sent to the ED for further evaluation of abnormal EKG.  Per review of  Muse, looks like she had 2 EKG done in the CHF clinic today. The first one from 9:44 shows normal sinus rhythm with elevated J point and upsloping ST segment in lead V2 which looks similar to prior tracings. However, the second one from 9:45 shows more concerning horizontal ST elevation in V2 but no other ST elevation in contiguous leads.  Upon arrival to the ED, patient hypertensive with systolic BP as high as the 180. EKG showed normal sinus rhythm, rate 64bpm, with elevated J point and upsloping ST segment in lead V2 (similar to the one from 9:44 in the office) but no significant change compared to prior tracing in 10/2022. High-sensitivity troponin elevated at 150 >> 154 not consistent with ACS. BNP 170 >> 163 (down from 272 last week). Chest x-ray showed decreased right upper lobe consolidation but no new airspace opacity. WBC 4.4, Hgb 11.2, Plts 188. Na 140, K 3.9, Glucose 109, BUN 54, Cr 4.18. Magnesium 1.9. Cardiology asked to see.  At the time of this evaluation, patient is resting comfortably in no acute distress. She denies any chest pain. She has chronic dyspnea which she states is from her asthma and improves when using her inhaler. However, she states her breathing is stable. She has chronic 4 pillow orthopnea but denies any PND. She has chronic lower extremity edema but her granddaughter states this looks so much better than it did. She notes occasional palpitations but nothing significant. She had a little dizziness last night but nothing significant. No syncope. No recent fevers or  illness. No nausea, vomiting, or diarrhea. She does struggle with constipation but this is not new. No abnormal bleeding in urine or stools.     Past Medical History:  Diagnosis Date   Acute superficial venous thrombosis of left lower extremity 03/27/2014   concern about hx and potential extensive on exam tenderness calf and low dose lovenox 40 qd 2-4 weekselevetion and close  fu advised .    Allergy     Anemia    Anxiety    Arthritis    "shoulders" (09/09/2012)   Asthma 09/08/2012   Carotid bruit    [place] 2018  low risk 1 - 39%   Cataract    bil cateracts removed   Chronic kidney disease    Clotting disorder (HCC)    blood clots in legs   Diabetes mellitus without complication (HCC)    "Borderline" per pt; being monitored.  no meds per pt   Diverticulosis    Dyspnea 04/27/2014   GERD (gastroesophageal reflux disease)    Headache(784.0)    "related to my high blood pressure" (09/09/2012)   Hiatal hernia    History of DVT (deep vein thrombosis)    "RLE" (09/09/2012) coumadine cant take asa so on plavix    HTN (hypertension) 09/08/2012   Hyperlipidemia    Lupus (HCC)    "cured years ago" (09/09/2012)   Other dysphagia 02/11/2013   Syncope and collapse 01/21/2018   Varicose veins    Varicose veins of leg with complications 12/05/2010    Past Surgical History:  Procedure Laterality Date   ABDOMINAL HYSTERECTOMY     partial   BREAST EXCISIONAL BIOPSY Right    BREAST SURGERY     CARPAL TUNNEL RELEASE Right 2000's   CARPAL TUNNEL RELEASE Bilateral 10/21/2013   Procedure: BILATERAL  CARPAL TUNNEL RELEASE;  Surgeon: Nicki Reaper, MD;  Location: [facility];  Service: Orthopedics;  Laterality: Bilateral;   COLONOSCOPY     SHOULDER OPEN ROTATOR CUFF REPAIR Bilateral 2000's   TRIGGER FINGER RELEASE Right 10/21/2013   Procedure: RELEASE TRIGGER FINGER/A-1 PULLEY RIGHT MIDDLE AND RIGHT RING;  Surgeon: Nicki Reaper, MD;  Location: [facility];  Service: Orthopedics;  Laterality: Right;   UPPER GASTROINTESTINAL ENDOSCOPY       Home Medications:  Prior to Admission medications   Medication Sig Start Date End Date Taking? Authorizing Provider  acetaminophen (TYLENOL) 500 MG tablet Take 500 mg by mouth in the morning and at bedtime.    [provider]  albuterol (PROVENTIL) (2.5 MG/3ML) 0.083% nebulizer solution Take 3 mLs (2.5 mg total) by nebulization  every 6 (six) hours as needed for shortness of breath or wheezing. 08/03/22   Carlisle Beers, FNP  albuterol (VENTOLIN HFA) 108 (90 Base) MCG/ACT inhaler USE 2 PUFFS EVERY 6 HOURS AS NEEDED FOR WHEEZING. 08/07/22   Leslye Peer, MD  amLODipine (NORVASC) 10 MG tablet Take 1 tablet (10 mg total) by mouth daily. 11/07/22   Osvaldo Shipper, MD  atorvastatin (LIPITOR) 40 MG tablet Take 1 tablet (40 mg total) by mouth daily. 04/21/22   Nahser, Deloris Ping, MD  atropine 1 % ophthalmic solution Place 1 drop into both eyes 2 (two) times daily.    [provider]  Azelastine-Fluticasone 137-50 MCG/ACT SUSP PLACE ONE SPRAY IN EACH NOSTRIL TWICE DAILY (IN THE MORNING AND AT BEDTIME) 01/19/22   Byrum, Les Pou, MD  Budeson-Glycopyrrol-Formoterol (BREZTRI AEROSPHERE) 160-9-4.8 MCG/ACT AERO INHALE 2 PUFFS INTO THE LUNGS TWICE DAILY,  IN THE MORNING & AT BEDTIME. 06/22/22   Leslye Peer, MD  carvedilol (COREG) 12.5 MG tablet Take 1 tablet (12.5 mg total) by mouth 2 (two) times daily with a meal. 11/06/22   Osvaldo Shipper, MD  cetirizine (ZYRTEC) 10 MG tablet Take 10 mg by mouth daily as needed for allergies or rhinitis.    [provider]  citalopram (CELEXA) 20 MG tablet Take 1 tablet (20 mg total) by mouth every evening. 09/12/22   Panosh, Neta Mends, MD  clopidogrel (PLAVIX) 75 MG tablet TAKE ONE TABLET BY MOUTH ONCE DAILY. 10/19/22   Panosh, Neta Mends, MD  diclofenac Sodium (VOLTAREN) 1 % GEL Apply 4 g topically 2 (two) times daily as needed (painful areas). 05/02/22   Ellsworth Lennox, PA-C  doxazosin (CARDURA) 4 MG tablet Take 1 tablet (4 mg total) by mouth every 12 (twelve) hours. 11/06/22   Osvaldo Shipper, MD  ferrous sulfate 325 (65 FE) MG EC tablet Take 1 tablet (325 mg total) by mouth daily with breakfast. 12/15/14   Panosh, Neta Mends, MD  fluticasone (FLONASE) 50 MCG/ACT nasal spray Place 2 sprays into both nostrils as needed for allergies or rhinitis.    [provider]  folic acid (FOLVITE)  1 MG tablet Take 1 tablet (1 mg total) by mouth daily. 11/07/22   Osvaldo Shipper, MD  furosemide (LASIX) 40 MG tablet Take 2 tablets (80 mg total) by mouth every morning AND 1.5 tablets (60 mg total) every evening. 12/08/22   Sabharwal, Aditya, DO  hydrALAZINE (APRESOLINE) 100 MG tablet Take 1 tablet (100 mg total) by mouth 3 (three) times daily. 11/06/22   Osvaldo Shipper, MD  LORazepam (ATIVAN) 0.5 MG tablet Take 1 tablet (0.5 mg total) by mouth 2 (two) times daily as needed. for anxiety 09/17/22   Panosh, Neta Mends, MD  montelukast (SINGULAIR) 10 MG tablet TAKE ONE TABLET BY MOUTH ONCE DAILY 08/06/22   Worthy Rancher B, FNP  pantoprazole (PROTONIX) 40 MG tablet TAKE ONE TABLET BY MOUTH TWICE DAILY 08/22/22   Panosh, Neta Mends, MD  potassium chloride (KLOR-CON M) 10 MEQ tablet Take 1 tablet (10 mEq total) by mouth daily. 11/06/22   Osvaldo Shipper, MD  RESTASIS 0.05 % ophthalmic emulsion Place 1 drop into both eyes 2 (two) times daily. 10/13/21   [provider]  sodium bicarbonate 650 MG tablet Take 1 tablet (650 mg total) by mouth 2 (two) times daily. 11/06/22   Osvaldo Shipper, MD  Spacer/Aero-Holding Chambers (AEROCHAMBER PLUS) inhaler Use as instructed Patient taking differently: Use as instructed as needed 05/26/16   Domenick Gong, MD  vitamin B-12 (CYANOCOBALAMIN) 1000 MCG tablet Take 1,000 mcg by mouth daily.    [provider]  Vitamin D, Cholecalciferol, 25 MCG (1000 UT) TABS Take 1,000 Units by mouth daily.    [provider]  potassium chloride (KLOR-CON) 10 MEQ tablet TAKE FOUR TABLETS BY MOUTH DAILY. Patient taking differently: Take 20 mEq by mouth 2 (two) times daily. 10/19/22 11/06/22  Panosh, Neta Mends, MD    Inpatient Medications: Scheduled Meds:  Continuous Infusions:  PRN Meds:   Allergies:    Allergies  Allergen Reactions   Penicillins Nausea And Vomiting and Other (See Comments)    Has patient had a PCN reaction causing immediate rash,  facial/tongue/throat swelling, SOB or lightheadedness with hypotension: Y Has patient had a PCN reaction causing severe rash involving mucus membranes or skin necrosis: Y Has patient had a PCN reaction that required hospitalization: N Has patient  had a PCN reaction occurring within the last 10 years: N If all of the above answers are "NO", then may proceed with Cephalosporin use.    Aspirin Other (See Comments)    Wheezing Acetaminophen is OK     Social History:   Social History   Socioeconomic History   Marital status: Single    Spouse name: Not on file   Number of children: 6   Years of education: Not on file   Highest education level: Not on file  Occupational History   Occupation: retired  Tobacco Use   Smoking status: Never   Smokeless tobacco: Never  Vaping Use   Vaping status: Never Used  Substance and Sexual Activity   Alcohol use: No   Drug use: No   Sexual activity: Not Currently    Birth control/protection: Surgical  Other Topics Concern   Not on file  Social History Narrative   2 people living in the home.  Grand daughter.   Up and down through the night      Had 7 children  2 deceased . 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    works 3 days per week currently .   Glasses dentures  Neg tad    Social Determinants of Health   Financial Resource Strain: Low Risk  (04/14/2022)   Overall Financial Resource Strain (CARDIA)    Difficulty of Paying Living Expenses: Not very hard  Food Insecurity: No Food Insecurity (12/01/2022)   Hunger Vital Sign    Worried About Running Out of Food in the Last Year: Never true    Ran Out of Food in the Last Year: Never true  Transportation Needs: No Transportation Needs (12/01/2022)   PRAPARE - Administrator, Civil Service (Medical): No    Lack of Transportation (Non-Medical): No  Physical Activity: Not on file  Stress: Not on file   Social Connections: Not on file  Intimate Partner Violence: Not At Risk (10/31/2022)   Humiliation, Afraid, Rape, and Kick questionnaire    Fear of Current or Ex-Partner: No    Emotionally Abused: No    Physically Abused: No    Sexually Abused: No    Family History:   Family History  Problem Relation Age of Onset   Hypertension Mother    Lung cancer Mother    Hypertension Father    Lung cancer Father    Breast cancer Sister    Colon cancer Sister        ? 73' s dx - died in 62's   Breast cancer Sister    Hypertension Brother    Rectal cancer Brother    Stomach cancer Brother    Breast cancer Brother    Heart attack Daughter    Esophageal cancer Daughter    Hypertension Daughter    Diabetes Daughter    Breast cancer Paternal Aunt    Lung cancer Other        both parents    Pancreatic cancer Neg Hx      ROS:  Please see the history of present illness.  Review of Systems  Constitutional:  Positive for malaise/fatigue. Negative for fever.  HENT:  Negative for congestion.   Respiratory:  Positive for shortness of breath. Negative for cough.   Cardiovascular:  Positive for palpitations, orthopnea (chronic and stable) and leg swelling. Negative for chest pain and PND.  Gastrointestinal:  Negative for blood in stool, melena, nausea and vomiting.  Genitourinary:  Negative for hematuria.  Musculoskeletal:  Negative for myalgias.  Neurological:  Positive for dizziness. Negative for loss of consciousness.  Endo/Heme/Allergies:  Does not bruise/bleed easily.  Psychiatric/Behavioral:  Negative for substance abuse.    Physical Exam/Data:   Vitals:   12/15/22 1133 12/15/22 1145 12/15/22 1432 12/15/22 1440  BP:  (!) 179/64 (!) 174/80   Pulse: (!) 58 (!) 54 (!) 59   Resp: 13 13 13    Temp:    97.6 F (36.4 C)  TempSrc:    Oral  SpO2: 100% 100% 100%   Weight:      Height:       No intake or output data in the 24 hours ending 12/15/22 1627    12/15/2022   10:24 AM 12/15/2022     8:47 AM 12/08/2022    2:14 PM  Last 3 Weights  Weight (lbs) 135 lb 135 lb 3.2 oz 143 lb  Weight (kg) 61.236 kg 61.326 kg 64.864 kg     Body mass index is 26.37 kg/m.  General: 89 y.o. African-American female resting comfortably in no acute distress. HEENT: Normocephalic and atraumatic. Sclera clear.  Neck: Supple. No JVD. Heart: RRR. Distinct S1 and S2. No murmurs, gallops, or rubs. Lungs: No increased work of breathing. Clear to ausculation bilaterally. No wheezes, rhonchi, or rales.  Abdomen: Soft, non-distended, and non-tender to palpation. Extremities: 1+ pitting edema of bilaterally lower extremities. Skin: Warm and dry. Neuro: Alert and oriented x3. No focal deficits. Psych: Normal affect. Responds appropriately.  EKG:  The EKG was personally reviewed and demonstrates:  See HPI. Telemetry:  Telemetry was personally reviewed and demonstrates:  Sinus rhythm with rates in the 50s to 60s.   Relevant CV Studies:  Myoview 11/14/2018: Nuclear stress EF: 73%. There was no ST segment deviation noted during stress. The study is normal. This is a low risk study. The left ventricular ejection fraction is hyperdynamic (>65%).   Normal resting and stress perfusion. No ischemia or infarction EF 72% _______________  Echocardiogram 10/31/2022: Impression: 1. Left ventricular ejection fraction, by estimation, is 60 to 65%. The  left ventricle has normal function. The left ventricle has no regional  wall motion abnormalities. There is moderate concentric left ventricular  hypertrophy. Left ventricular  diastolic parameters are consistent with Grade II diastolic dysfunction  (pseudonormalization). Elevated left atrial pressure.   2. Right ventricular systolic function is normal. The right ventricular  size is mildly enlarged. There is moderately elevated pulmonary artery  systolic pressure. The estimated right ventricular systolic pressure is  49.0 mmHg.   3. Left atrial size was  severely dilated.   4. The mitral valve is normal in structure. Mild mitral valve  regurgitation.   5. The aortic valve is tricuspid. There is mild calcification of the  aortic valve. There is mild thickening of the aortic valve. Aortic valve  regurgitation is not visualized. Aortic valve sclerosis is present, with  no evidence of aortic valve stenosis.   6. The inferior vena cava is dilated in size with >50% respiratory  variability, suggesting right atrial pressure of 8 mmHg.   7. Cannot exclude a small PFO.   Comparison(s): No significant change from prior study. Prior images  reviewed side by side.    Laboratory Data:  High Sensitivity Troponin:   Recent Labs  Lab 12/15/22 1101 12/15/22 1229  TROPONINIHS 150* 154*     Chemistry Recent Labs  Lab  12/15/22 0950 12/15/22 1101  NA 139 140  K 3.2* 3.9  CL 100 101  CO2 27 24  GLUCOSE 114* 109*  BUN 53* 54*  CREATININE 4.25* 4.18*  CALCIUM 8.5* 8.6*  MG 1.9  --   GFRNONAA 10* 10*  ANIONGAP 12 15    No results for input(s): "PROT", "ALBUMIN", "AST", "ALT", "ALKPHOS", "BILITOT" in the last 168 hours. Lipids No results for input(s): "CHOL", "TRIG", "HDL", "LABVLDL", "LDLCALC", "CHOLHDL" in the last 168 hours.  Hematology Recent Labs  Lab 12/15/22 1101  WBC 4.4  RBC 3.82*  HGB 11.2*  HCT 37.5  MCV 98.2  MCH 29.3  MCHC 29.9*  RDW 16.2*  PLT 188   Thyroid No results for input(s): "TSH", "FREET4" in the last 168 hours.  BNP Recent Labs  Lab 12/15/22 0950 12/15/22 1101  BNP 170.5* 163.7*    DDimer No results for input(s): "DDIMER" in the last 168 hours.   Radiology/Studies:  DG Chest 2 View  Result Date: 12/15/2022 CLINICAL DATA:  Abnormal EKG EXAM: CHEST - 2 VIEW COMPARISON:  Chest x-ray dated November 02, 2022 FINDINGS: Unchanged cardiomegaly and dilated main pulmonary arteries. Decreased right upper lobe consolidation. No evidence of pleural effusion or pneumothorax. IMPRESSION: Decreased right upper lobe  consolidation. No new airspace opacity. Recommend additional follow-up PA and lateral chest radiograph in 6-8 weeks to ensure complete resolution. Electronically Signed   By: Allegra Lai M.D.   On: 12/15/2022 11:37     Assessment and Plan:   Abnormal EKG Elevated Troponin Patient was sent to the ED from the CHF Clinic due to concerns for an abnormal EKG with new ST elevation in lead V2. She had one EKG in the office that looked unchanged from prior EKG. However, repeat EKG showed horizontal ST elevation in lead V2 but no other ST elevation in continuous leads.  This resolved on repeat EKG upon arrival to the ED. High-sensitivity troponin minimally elevated and flat at 150 >> 154. She denies any chest pain. Troponin elevation not consistent with ACS. Possibly due to CKD stage IV. Therefore, I think she can like go home. Will review with MD.   Chronic HFpEF Echo in 10/2022 showed LVEF of 60-65% with no regional wall motion abnormalities, moderate LHV, and grade 2 diastolic dysfunction as well as mildly enlarged RV with normal RV function and moderately elevated PASP of 49.0 mmHg. Patient was recently seen by CHF Clinic and noted to be volume overloaded - her Lasix was increased and she was prescribed 3 days of Metolazone. BNP on admission 170 >> 163 (down from 272 on 7/26). Weight down 8lbs since visit on 12/08/2022. Does not look volume overloaded one exam. Continue Lasix 80mg  in the morning and 60mg  in the evening. She already has follow-up with both Dr. Elease Hashimoto and the CHF Clinic.  Pulmonary Hypertension Recent Echo in 10/2022 showed mildly enlarged RV with normal RV function and moderately elevated PASP of 49.0 mmHg. Felt to likely be secondary to chronically elevated left sided pressure. Per note from [facility], NP in the CHF Clinic, today, no indication for invasive hemodynamics and treatment for [facility] therapy. PASP unchanged from TTEs dating from 2020 - 2024. Continue diuresis as  above.  Hypertension BP elevated but she has not taken her morning medications yet. Continue home medications: Amlodipine 10mg  daily, Coreg 12.5mg  twice daily, Hydralazine 100mg  three times daily, and Cardura 4mg  twice daily.  Hyperlipidemia Continue Lipitor 40mg  daily.  CKD Stage IV Creatinine 4.18 >> 4.25 today.  Baseline around 3.0 to 3.5. Slightly up after being treated with 3 days of Metolazone. She is followed by Nephrology and was actually scheduled to see them today. She will call and rescrhedule this.  History of DVT She has a history of DVT. She states this is why she is on Plavix.   Risk Assessment/Risk Scores:    New York Heart Association (NYHA) Functional Class NYHA Class II-III   For questions or updates, please contact [facility] Please consult [email or web address] for contact info under    Signed, Corrin Parker, PA-C  12/15/2022 4:27 PM  ATTENDING ATTESTATION:  After conducting a review of all available clinical information with the care team, interviewing the patient, and performing a physical exam, I agree with the findings and plan described in this note.   GEN: No acute distress.   HEENT:  MMM, no JVD, no scleral icterus Cardiac: RRR, no murmurs, rubs, or gallops.  Respiratory: Clear to auscultation bilaterally. GI: Soft, nontender, non-distended  MS: No edema; No deformity. Neuro:  Nonfocal  Vasc:  +2 radial pulses  Patient is a 89 year old female with a history of diastolic heart failure, hypertension, type 2 diabetes, hyperlipidemia, and stage IV chronic kidney disease was seen in clinic earlier today.  An EKG was done in a routine basis which was concerning for anterior ST elevations.  Repeat EKG shows normalization with left ventricular hypertrophy pattern.  The patient is chest pain-free and denies any other cardiovascular symptoms including dyspnea, nausea, orthopnea, paroxysmal atrial dyspnea.  No further cardiac evaluation is required.   Discussed with patient and family at bedside.  Alverda Skeans, MD Pager [phone]

## 2022-12-15 NOTE — ED Triage Notes (Signed)
Pt sent to ED d/t abnormal EKG while at CHF clinic.  Pt denies complaints.

## 2022-12-15 NOTE — Discharge Instructions (Signed)
Follow-up with Dr. Elease Hashimoto next week as scheduled.  Return to the emergency room if you have any worsening symptoms.

## 2022-12-15 NOTE — Patient Instructions (Signed)
Thank you for coming in today  EKG was done today  If you had labs drawn today, any labs that are abnormal the clinic will call you No news is good news  Medications: No changes  Follow up appointments:  Your physician recommends that you schedule a follow-up appointment in:  3-4 weeks in clinic    Do the following things EVERYDAY: Weigh yourself in the morning before breakfast. Write it down and keep it in a log. Take your medicines as prescribed Eat low salt foods--Limit salt (sodium) to 2000 mg per day.  Stay as active as you can everyday Limit all fluids for the day to less than 2 liters   At the [facility], you and your health needs are our priority. As part of our continuing mission to provide you with exceptional heart care, we have created designated Provider Care Teams. These Care Teams include your primary Cardiologist (physician) and Advanced Practice Providers (APPs- Physician Assistants and Nurse Practitioners) who all work together to provide you with the care you need, when you need it.   You may see any of the following providers on your designated Care Team at your next follow up: Dr Arvilla Meres Dr Marca Ancona Dr. Marcos Eke, NP Robbie Lis, [place] [facility] [place], [place] Brynda Peon, NP Karle Plumber, PharmD   Please be sure to bring in all your medications bottles to every appointment.    Thank you for choosing [facility]  If you have any questions or concerns before your next appointment please send [place] a message through [place] or call our office at [phone].    TO LEAVE A MESSAGE FOR THE NURSE SELECT OPTION 2, PLEASE LEAVE A MESSAGE INCLUDING: YOUR NAME DATE OF BIRTH CALL BACK NUMBER REASON FOR CALL**this is important as we prioritize the call backs  YOU WILL RECEIVE A CALL BACK THE SAME DAY AS LONG AS YOU CALL BEFORE 4:00 PM

## 2022-12-15 NOTE — ED Provider Notes (Signed)
Cardiology has seen the patient and feels that the patient can safely be discharged home.  They suspect her elevated troponins are related to her chronic kidney disease.  They do not appreciate any significant concerning EKG abnormalities.  Patient has an appointment with her cardiologist next week.  She is currently symptom-free.  She was discharged home in good condition.  Return precautions were given.   Rolan Bucco, MD 12/15/22 [phone]

## 2022-12-15 NOTE — Progress Notes (Signed)
ReDS Vest / Clip - 12/15/22 0900       ReDS Vest / Clip   Station Marker A    Ruler Value 29.5    ReDS Value Range Low volume    ReDS Actual Value 30

## 2022-12-21 ENCOUNTER — Encounter: Payer: Self-pay | Admitting: Cardiovascular Disease

## 2022-12-21 NOTE — Progress Notes (Signed)
Cardiology    Patient ID: [name] MRN: [id number]; DOB: [birth year]    Primary Care Provider: [name], MD Primary Cardiologist: Kristeen Miss, MD  Primary Electrophysiologist:  None   Problem list 1.  Hypertension 2.  Hyperlipidemia 3.  Prior DVT 4.  Lupus 5.  Chronic kidney disease 6.  Syncope 7.  Leg edema   Chief Complaint:  Syncope   Patient Profile:   [name] is a 89 y.o. female with history of hypertension and hyperlipidemia.  She was recently seen by Tereso Newcomer, PA for an episode of syncope.  Seen in the past for an episode of leg swelling in 2015.      [name] is seen for further evaluation following her episode of syncope in July.  During that time, her metoprolol had just been changed to carvedilol.   Echocardiogram revealed normal left ventricular systolic function.  She has moderate pulmonary hypertension with an estimated PA pressure of 45. V Q scan was low risk for pulmonary embolus. She had a 30 day monitor that revealed normal sinus rhythm and sinus bradycardia.  She had one 5 beat run of ventricular ectopy which was not significant.  No further episodes of syncope  BP is still elevated.   Still eats some salty foods.   July, 23, 2020 :  [name] is seen for follow up visit BP is still mildy elevated.  Was hospitalized this past weekend.   Has vomitting and diarrhea.   These have resolved.  Troponin was 0.12   Still eating a very salty diet  Legs remains swollen .   Denies any chest pain .  Breathing is better.  Last echo was 5/19 which revealed normal LV function. Moderate pulmonary HTN - PA pressure of 45 .   August 06, 2019:  [name] is seen today for follow-up visit.  She has a history of hypertension.  She has a history of moderate pulmonary hypertension with an estimated PA pressure of 45. In the past she has been eating a very high salt diet.  She is doing well.  She is not had any further episodes of syncope  or presyncope.  She is not had any episodes of chest pain or shortness of breath.  She is been trying to avoid eating excessive salt.  Her blood pressure and heart rate are well controlled in the office visit today.  March 21, 2021: [name] is seen today for follow-up of her hypertension, chronic diastolic congestive heart failure, moderate pulmonary hypertension with an estimated PA pressure of 45 mm of mercury.  She has been eating a very high salt diet in the past. She days she is eating better now. No CP or dyspnea  Her leg swelling has improved.  Has not taken her BP meds this am.   December 06, 2021 Seen with daugher , [name] is seen for HTN, chronic diastolic chf, moderate pulmonary HTN She has had some heart racing symptoms   Overall breathing is ok  Has occasional ches pain ( when she lies back ) Has palpitations ( HR faster than normal )  Possibly associated with 3 recent deaths in her family .    Feb. 9, 2024 [name] is seen for follow up of her HTN, diastolic CHF, moderate pulmonary HTN  Has had some swelling in her legs .  Orthopedic did venous duplex - negative for PE  Limits her salty foods,   gets take out food on occasion .   Gave  instructions about ordering  the [facility] Doctor  Recommended leg elevation   Creatinine was 2.8 in July 2023   She would like to see the doctors in the advanced CHF clinic    Aug. 9, ,2024 [name] is seen for follow up of her HTN, leg edema  Has worsening CKD Her last creatinine was 4.18 She sees Dr. [name] on Aug. 22  She has recently been seen in the CHF clinic  Was seen last week and has a follow up appt on Aug. 26.   No CP or dyspnea   Has chronic diastolic CHF      Past Medical History:  Diagnosis Date   Acute superficial venous thrombosis of left lower extremity 03/27/2014   concern about hx and potential extensive on exam tenderness calf and low dose lovenox 40 qd 2-4 weekselevetion and close  fu advised .     Allergy    Anemia    Anxiety    Arthritis    "shoulders" (09/09/2012)   Asthma 09/08/2012   Carotid bruit    [place] 2018  low risk 1 - 39%   Cataract    bil cateracts removed   Chronic kidney disease    Clotting disorder (HCC)    blood clots in legs   Diabetes mellitus without complication (HCC)    "Borderline" per pt; being monitored.  no meds per pt   Diverticulosis    Dyspnea 04/27/2014   GERD (gastroesophageal reflux disease)    Headache(784.0)    "related to my high blood pressure" (09/09/2012)   Hiatal hernia    History of DVT (deep vein thrombosis)    "RLE" (09/09/2012) coumadine cant take asa so on plavix    HTN (hypertension) 09/08/2012   Hyperlipidemia    Lupus (HCC)    "cured years ago" (09/09/2012)   Other dysphagia 02/11/2013   Syncope and collapse 01/21/2018   Varicose veins    Varicose veins of leg with complications 12/05/2010    Past Surgical History:  Procedure Laterality Date   ABDOMINAL HYSTERECTOMY     partial   BREAST EXCISIONAL BIOPSY Right    BREAST SURGERY     CARPAL TUNNEL RELEASE Right 2000's   CARPAL TUNNEL RELEASE Bilateral 10/21/2013   Procedure: BILATERAL  CARPAL TUNNEL RELEASE;  Surgeon: Nicki Reaper, MD;  Location: [facility];  Service: Orthopedics;  Laterality: Bilateral;   COLONOSCOPY     SHOULDER OPEN ROTATOR CUFF REPAIR Bilateral 2000's   TRIGGER FINGER RELEASE Right 10/21/2013   Procedure: RELEASE TRIGGER FINGER/A-1 PULLEY RIGHT MIDDLE AND RIGHT RING;  Surgeon: Nicki Reaper, MD;  Location: [facility];  Service: Orthopedics;  Laterality: Right;   UPPER GASTROINTESTINAL ENDOSCOPY       Medications Prior to Admission: Prior to Admission medications   Medication Sig Start Date End Date Taking? Authorizing Provider  acetaminophen (TYLENOL) 500 MG tablet Take 1,000 mg by mouth every 6 (six) hours as needed for moderate pain. Reported on 04/30/2015   Yes [provider]  albuterol (PROVENTIL) (2.5  MG/3ML) 0.083% nebulizer solution USE 1 AMPULE EVERY 6 HOURS AS NEEDED FOR WHEEZE 10/23/17  Yes Panosh, Neta Mends, MD  amLODipine-olmesartan (AZOR) 10-40 MG tablet Take 1 tablet by mouth daily.   Yes Bufford Buttner, MD  carvedilol (COREG) 12.5 MG tablet Take 6.25 mg by mouth daily. 1/2 tablet daily   Yes Bufford Buttner, MD  citalopram (CELEXA) 20 MG tablet TAKE 1 TABLET ONCE DAILY. 12/14/17  Yes Panosh, Burna Mortimer  K, MD  clopidogrel (PLAVIX) 75 MG tablet TAKE 1 TABLET ONCE DAILY. 10/19/17  Yes Panosh, Neta Mends, MD  diclofenac sodium (VOLTAREN) 1 % GEL APPLY 4GM TO AFFECTED AREA(S) 4 TIMES A DAY. 07/13/17  Yes Panosh, Neta Mends, MD  doxazosin (CARDURA) 2 MG tablet TAKE 3 TABLETS AT BEDTIME. 12/13/17  Yes Panosh, Neta Mends, MD  fenofibrate 160 MG tablet TAKE 1 TABLET ONCE DAILY. 12/13/17  Yes Panosh, Neta Mends, MD  ferrous sulfate 325 (65 FE) MG EC tablet Take 1 tablet (325 mg total) by mouth daily with breakfast. 12/15/14  Yes Panosh, Neta Mends, MD  fluorometholone (FML) 0.1 % ophthalmic suspension Place 1 drop into both eyes 2 (two) times daily.  09/13/17  Yes [provider]  Fluticasone-Salmeterol (ADVAIR) 250-50 MCG/DOSE AEPB USE 1 PUFF TWICE DAILY. 11/09/17  Yes Panosh, Neta Mends, MD  furosemide (LASIX) 80 MG tablet Take 80 mg by mouth daily.   Yes Bufford Buttner, MD  hydrALAZINE (APRESOLINE) 25 MG tablet Take 1 tablet by mouth daily. 12/03/17  Yes [provider]  lidocaine (LIDODERM) 5 % Place 1 patch onto the skin daily. Remove & Discard patch within 12 hours or as directed by MD Patient taking differently: Place 1 patch onto the skin daily as needed (pain). Remove & Discard patch within 12 hours or as directed by MD 01/04/17  Yes Oxford, Anselm Pancoast, FNP  LORazepam (ATIVAN) 1 MG tablet Take 1 tablet (1 mg total) by mouth every 8 (eight) hours as needed for anxiety. Avoid regular use 08/28/17  Yes Panosh, Neta Mends, MD  montelukast (SINGULAIR) 10 MG tablet TAKE 1 TABLET ONCE DAILY. 10/31/17  Yes Panosh, Neta Mends, MD  pantoprazole (PROTONIX) 40 MG tablet TAKE (1) TABLET TWICE A DAY BEFORE MEALS. 10/23/17  Yes Panosh, Neta Mends, MD  potassium chloride (K-DUR,KLOR-CON) 10 MEQ tablet Take 10 mEq by mouth 4 (four) times daily.   Yes [provider]  PROAIR HFA 108 (90 Base) MCG/ACT inhaler USE 2 PUFFS EVERY 6 HOURS AS NEEDED FOR WHEEZING. 12/13/17  Yes Panosh, Neta Mends, MD  RESTASIS 0.05 % ophthalmic emulsion Place 1 drop into both eyes 2 (two) times daily.  01/01/15  Yes [provider]  Spacer/Aero-Holding Chambers (AEROCHAMBER PLUS) inhaler Use as instructed 05/26/16  Yes Domenick Gong, MD  vitamin B-12 (CYANOCOBALAMIN) 1000 MCG tablet Take 1,000 mcg by mouth daily.   Yes [provider]     Allergies:    Allergies  Allergen Reactions   Penicillins Nausea And Vomiting and Other (See Comments)    Has patient had a PCN reaction causing immediate rash, facial/tongue/throat swelling, SOB or lightheadedness with hypotension: Y Has patient had a PCN reaction causing severe rash involving mucus membranes or skin necrosis: Y Has patient had a PCN reaction that required hospitalization: N Has patient had a PCN reaction occurring within the last 10 years: N If all of the above answers are "NO", then may proceed with Cephalosporin use.    Aspirin Other (See Comments)    Wheezing Acetaminophen is OK     Social History:   Social History   Socioeconomic History   Marital status: Single    Spouse name: Not on file   Number of children: 6   Years of education: Not on file   Highest education level: Not on file  Occupational History   Occupation: retired  Tobacco Use   Smoking status: Never   Smokeless tobacco: Never  Vaping Use   Vaping status:  Never Used  Substance and Sexual Activity   Alcohol use: No   Drug use: No   Sexual activity: Not Currently    Birth control/protection: Surgical  Other Topics Concern   Not on file  Social History Narrative   2 people living in  the home.  Grand daughter.   Up and down through the night      Had 7 children  2 deceased . 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    works 3 days per week currently .   Glasses dentures  Neg tad    Social Determinants of Health   Financial Resource Strain: Low Risk  (04/14/2022)   Overall Financial Resource Strain (CARDIA)    Difficulty of Paying Living Expenses: Not very hard  Food Insecurity: No Food Insecurity (12/01/2022)   Hunger Vital Sign    Worried About Running Out of Food in the Last Year: Never true    Ran Out of Food in the Last Year: Never true  Transportation Needs: No Transportation Needs (12/01/2022)   PRAPARE - Administrator, Civil Service (Medical): No    Lack of Transportation (Non-Medical): No  Physical Activity: Not on file  Stress: Not on file  Social Connections: Not on file  Intimate Partner Violence: Not At Risk (10/31/2022)   Humiliation, Afraid, Rape, and Kick questionnaire    Fear of Current or Ex-Partner: No    Emotionally Abused: No    Physically Abused: No    Sexually Abused: No    Family History:   The patient's family history includes Breast cancer in her brother, paternal aunt, sister, and sister; Colon cancer in her sister; Diabetes in her daughter; Esophageal cancer in her daughter; Heart attack in her daughter; Hypertension in her brother, daughter, father, and mother; Lung cancer in her father, mother, and another family member; Rectal cancer in her brother; Stomach cancer in her brother. There is no history of Pancreatic cancer.    ROS:  Please see the history of present illness.  All other ROS reviewed and negative.     Physical Exam/Data:    Physical Exam: Blood pressure 139/67, pulse 61, height 5' (1.524 m), weight 139 lb 9.6 oz (63.3 kg), SpO2 98%.      GEN:  Well nourished, well developed in no acute distress HEENT: Normal NECK: No JVD;  No carotid bruits LYMPHATICS: No lymphadenopathy CARDIAC: RRR , no murmurs, rubs, gallops RESPIRATORY:  Clear to auscultation without rales, wheezing or rhonchi  ABDOMEN: Soft, non-tender, non-distended MUSCULOSKELETAL:   1+ edema  No deformity  SKIN: Warm and dry NEUROLOGIC:  Alert and oriented x 3      EKG:          Relevant CV Studies:   Laboratory Data:  Chemistry No results for input(s): "NA", "K", "CL", "CO2", "GLUCOSE", "BUN", "CREATININE", "CALCIUM", "GFRNONAA", "GFRAA", "ANIONGAP" in the last 168 hours.    No results for input(s): "PROT", "ALBUMIN", "AST", "ALT", "ALKPHOS", "BILITOT" in the last 168 hours.  Hematology No results for input(s): "WBC", "RBC", "HGB", "HCT", "MCV", "MCH", "MCHC", "RDW", "PLT" in the last 168 hours.   Cardiac Enzymes No results for input(s): "TROPONINI" in the last 168 hours. No results for input(s): "TROPIPOC" in the last 168 hours.  BNP No results for input(s): "BNP", "PROBNP" in the last 168 hours.   DDimer No results for input(s): "DDIMER" in the last  168 hours.  Radiology/Studies:  No results found.  Assessment and Plan:   1.   Chronic diastolic CHF :    she appears to be euvolemic today .  Is on max dose amlodipine, hydralazine, .  Is on Coreg 12. 5 BID with HR of 61 Continue current medications for now  She will follow up in the advanced CHF clinic and will see general cards PRN     2.  Hypertension:    .  she appears to be euvolemic today .  Is on max dose amlodipine, hydralazine, .  Is on Coreg 12.5 BID with HR of 61 Continue current medications for now   3.   Pulmonary HTN:        4.  CKD:  her creatinine has worsened slightly   - follow up with Dr. [name]     Signed, Kristeen Miss, MD  12/22/2022 11:46 AM

## 2022-12-22 ENCOUNTER — Ambulatory Visit: Payer: Medicare Other | Admitting: Cardiovascular Disease

## 2022-12-22 ENCOUNTER — Telehealth: Payer: Self-pay

## 2022-12-22 ENCOUNTER — Encounter: Payer: Self-pay | Admitting: Cardiovascular Disease

## 2022-12-22 VITALS — BP 139/67 | HR 61 | Ht 60.0 in | Wt 139.6 lb

## 2022-12-22 DIAGNOSIS — I10 Essential (primary) hypertension: Secondary | ICD-10-CM | POA: Diagnosis not present

## 2022-12-22 DIAGNOSIS — I5033 Acute on chronic diastolic (congestive) heart failure: Secondary | ICD-10-CM

## 2022-12-22 MED ORDER — HYDRALAZINE HCL 100 MG PO TABS: 100.0000 mg | ORAL_TABLET | Freq: Three times a day (TID) | ORAL | 1 refills | Status: DC

## 2022-12-22 NOTE — Patient Instructions (Signed)
 Medication Instructions:  Your physician recommends that you continue on your current medications as directed. Please refer to the Current Medication list given to you today.  *If you need a refill on your cardiac medications before your next appointment, please call your pharmacy*   Lab Work: NONE If you have labs (blood work) drawn today and your tests are completely normal, you will receive your results only by: MyChart Message (if you have MyChart) OR A paper copy in the mail If you have any lab test that is abnormal or we need to change your treatment, we will call you to review the results.   Testing/Procedures: NONE   Follow-Up: At [facility], you and your health needs are our priority.  As part of our continuing mission to provide you with exceptional heart care, we have created designated Provider Care Teams.  These Care Teams include your primary Cardiologist (physician) and Advanced Practice Providers (APPs -  Physician Assistants and Nurse Practitioners) who all work together to provide you with the care you need, when you need it.  Your next appointment:   As Needed  Provider:   Kristeen Miss, MD

## 2022-12-22 NOTE — Telephone Encounter (Signed)
Transition Care Management Unsuccessful Follow-up Telephone Call  Date of discharge and from where:  [facility]  Attempts:  1st Attempt  Reason for unsuccessful TCM follow-up call:  No answer/busy   [facility] Guide, [facility] [phone] [address] [place], [place], [place] [address] Phone: [phone] Email: [email or web address]

## 2022-12-22 NOTE — Telephone Encounter (Signed)
Transition Care Management Unsuccessful Follow-up Telephone Call  Date of discharge and from where:  [facility]  Attempts:  2nd Attempt  Reason for unsuccessful TCM follow-up call:  No answer/busy   [facility] Guide, [facility] [phone] [address] [place], [place], [place] [address] Phone: [phone] Email: [email or web address]

## 2022-12-27 ENCOUNTER — Telehealth: Payer: Self-pay | Admitting: Internal Medicine

## 2022-12-27 MED ORDER — MONTELUKAST SODIUM 10 MG PO TABS: 10.0000 mg | ORAL_TABLET | Freq: Every day | ORAL | 0 refills | Status: DC

## 2022-12-27 NOTE — Telephone Encounter (Signed)
Rx sent 

## 2022-12-27 NOTE — Telephone Encounter (Signed)
Prescription Request  12/27/2022  LOV: 11/14/2022  What is the name of the medication or equipment?  montelukast montelukast (SINGULAIR) 10 MG tablet  Have you contacted your pharmacy to request a refill? Yes   Which pharmacy would you like this sent to?   [facility] [place], [place] - [address] [place] [address] Phone: [phone] Fax: [phone]    Patient notified that their request is being sent to the clinical staff for review and that they should receive a response within 2 business days.   Please advise at Mobile [phone] (mobile)

## 2022-12-28 ENCOUNTER — Encounter (INDEPENDENT_AMBULATORY_CARE_PROVIDER_SITE_OTHER): Payer: Self-pay

## 2023-01-02 ENCOUNTER — Inpatient Hospital Stay: Payer: Medicare Other | Attending: Hematology

## 2023-01-02 ENCOUNTER — Other Ambulatory Visit: Payer: Self-pay

## 2023-01-02 ENCOUNTER — Inpatient Hospital Stay: Payer: Medicare Other

## 2023-01-02 VITALS — BP 151/50 | HR 67 | Resp 18

## 2023-01-02 DIAGNOSIS — N183 Chronic kidney disease, stage 3 unspecified: Secondary | ICD-10-CM | POA: Insufficient documentation

## 2023-01-02 DIAGNOSIS — D638 Anemia in other chronic diseases classified elsewhere: Secondary | ICD-10-CM

## 2023-01-02 DIAGNOSIS — I129 Hypertensive chronic kidney disease with stage 1 through stage 4 chronic kidney disease, or unspecified chronic kidney disease: Secondary | ICD-10-CM | POA: Diagnosis not present

## 2023-01-02 DIAGNOSIS — D631 Anemia in chronic kidney disease: Secondary | ICD-10-CM | POA: Insufficient documentation

## 2023-01-02 DIAGNOSIS — Z79899 Other long term (current) drug therapy: Secondary | ICD-10-CM | POA: Insufficient documentation

## 2023-01-02 LAB — FERRITIN: Ferritin: 217 ng/mL (ref 11–307)

## 2023-01-02 LAB — CBC WITH DIFFERENTIAL (CANCER CENTER ONLY)
Abs Immature Granulocytes: 0.01 10*3/uL (ref 0.00–0.07)
Basophils Absolute: 0 10*3/uL (ref 0.0–0.1)
Basophils Relative: 1 %
Eosinophils Absolute: 0.1 10*3/uL (ref 0.0–0.5)
Eosinophils Relative: 3 %
HCT: 33.7 % — ABNORMAL LOW (ref 36.0–46.0)
Hemoglobin: 10.4 g/dL — ABNORMAL LOW (ref 12.0–15.0)
Immature Granulocytes: 0 %
Lymphocytes Relative: 36 %
Lymphs Abs: 1.4 10*3/uL (ref 0.7–4.0)
MCH: 29.6 pg (ref 26.0–34.0)
MCHC: 30.9 g/dL (ref 30.0–36.0)
MCV: 96 fL (ref 80.0–100.0)
Monocytes Absolute: 0.3 10*3/uL (ref 0.1–1.0)
Monocytes Relative: 9 %
Neutro Abs: 2 10*3/uL (ref 1.7–7.7)
Neutrophils Relative %: 51 %
Platelet Count: 172 10*3/uL (ref 150–400)
RBC: 3.51 MIL/uL — ABNORMAL LOW (ref 3.87–5.11)
RDW: 14.6 % (ref 11.5–15.5)
WBC Count: 3.9 10*3/uL — ABNORMAL LOW (ref 4.0–10.5)
nRBC: 0 % (ref 0.0–0.2)

## 2023-01-02 MED ORDER — DARBEPOETIN ALFA 100 MCG/0.5ML IJ SOSY
100.0000 ug | PREFILLED_SYRINGE | Freq: Once | INTRAMUSCULAR | Status: AC
  Administered 2023-01-02: 100 ug via SUBCUTANEOUS
  Filled 2023-01-02: qty 0.5

## 2023-01-04 DIAGNOSIS — I471 Supraventricular tachycardia, unspecified: Secondary | ICD-10-CM | POA: Diagnosis not present

## 2023-01-04 DIAGNOSIS — N185 Chronic kidney disease, stage 5: Secondary | ICD-10-CM | POA: Diagnosis not present

## 2023-01-04 DIAGNOSIS — N2581 Secondary hyperparathyroidism of renal origin: Secondary | ICD-10-CM | POA: Diagnosis not present

## 2023-01-04 DIAGNOSIS — I120 Hypertensive chronic kidney disease with stage 5 chronic kidney disease or end stage renal disease: Secondary | ICD-10-CM | POA: Diagnosis not present

## 2023-01-05 ENCOUNTER — Other Ambulatory Visit: Payer: Self-pay | Admitting: Internal Medicine

## 2023-01-08 ENCOUNTER — Encounter (HOSPITAL_COMMUNITY): Payer: Medicare Other

## 2023-01-11 ENCOUNTER — Other Ambulatory Visit: Payer: Self-pay | Admitting: Cardiovascular Disease

## 2023-01-12 ENCOUNTER — Other Ambulatory Visit: Payer: Self-pay | Admitting: Cardiovascular Disease

## 2023-01-16 ENCOUNTER — Telehealth: Payer: Self-pay

## 2023-01-16 ENCOUNTER — Encounter: Payer: Self-pay | Admitting: Internal Medicine

## 2023-01-16 ENCOUNTER — Ambulatory Visit (INDEPENDENT_AMBULATORY_CARE_PROVIDER_SITE_OTHER): Payer: Medicare Other

## 2023-01-16 ENCOUNTER — Ambulatory Visit (INDEPENDENT_AMBULATORY_CARE_PROVIDER_SITE_OTHER): Payer: Medicare Other | Admitting: Internal Medicine

## 2023-01-16 ENCOUNTER — Other Ambulatory Visit: Payer: Self-pay | Admitting: Cardiovascular Disease

## 2023-01-16 VITALS — BP 166/56 | HR 59 | Temp 97.8°F | Ht 60.0 in | Wt 144.6 lb

## 2023-01-16 DIAGNOSIS — Z8701 Personal history of pneumonia (recurrent): Secondary | ICD-10-CM | POA: Diagnosis not present

## 2023-01-16 DIAGNOSIS — Z79899 Other long term (current) drug therapy: Secondary | ICD-10-CM

## 2023-01-16 DIAGNOSIS — R918 Other nonspecific abnormal finding of lung field: Secondary | ICD-10-CM | POA: Diagnosis not present

## 2023-01-16 DIAGNOSIS — M545 Low back pain, unspecified: Secondary | ICD-10-CM

## 2023-01-16 DIAGNOSIS — I272 Pulmonary hypertension, unspecified: Secondary | ICD-10-CM

## 2023-01-16 DIAGNOSIS — M79601 Pain in right arm: Secondary | ICD-10-CM | POA: Diagnosis not present

## 2023-01-16 DIAGNOSIS — D649 Anemia, unspecified: Secondary | ICD-10-CM | POA: Diagnosis not present

## 2023-01-16 DIAGNOSIS — I517 Cardiomegaly: Secondary | ICD-10-CM | POA: Diagnosis not present

## 2023-01-16 DIAGNOSIS — N184 Chronic kidney disease, stage 4 (severe): Secondary | ICD-10-CM

## 2023-01-16 DIAGNOSIS — Z23 Encounter for immunization: Secondary | ICD-10-CM | POA: Diagnosis not present

## 2023-01-16 DIAGNOSIS — M5136 Other intervertebral disc degeneration, lumbar region: Secondary | ICD-10-CM | POA: Diagnosis not present

## 2023-01-16 DIAGNOSIS — M4316 Spondylolisthesis, lumbar region: Secondary | ICD-10-CM | POA: Diagnosis not present

## 2023-01-16 DIAGNOSIS — I5030 Unspecified diastolic (congestive) heart failure: Secondary | ICD-10-CM | POA: Diagnosis not present

## 2023-01-16 DIAGNOSIS — M47816 Spondylosis without myelopathy or radiculopathy, lumbar region: Secondary | ICD-10-CM | POA: Diagnosis not present

## 2023-01-16 LAB — URINALYSIS, ROUTINE W REFLEX MICROSCOPIC
Bilirubin Urine: NEGATIVE
Hgb urine dipstick: NEGATIVE
Ketones, ur: NEGATIVE
Leukocytes,Ua: NEGATIVE
Nitrite: NEGATIVE
Specific Gravity, Urine: 1.02 (ref 1.000–1.030)
Total Protein, Urine: 100 — AB
Urine Glucose: NEGATIVE
Urobilinogen, UA: 0.2 (ref 0.0–1.0)
pH: 6 (ref 5.0–8.0)

## 2023-01-16 LAB — BASIC METABOLIC PANEL
BUN: 43 mg/dL — ABNORMAL HIGH (ref 6–23)
CO2: 23 meq/L (ref 19–32)
Calcium: 8.9 mg/dL (ref 8.4–10.5)
Chloride: 107 meq/L (ref 96–112)
Creatinine, Ser: 3.82 mg/dL — ABNORMAL HIGH (ref 0.40–1.20)
GFR: 10.18 mL/min — CL (ref >60.00)
Glucose, Bld: 110 mg/dL — ABNORMAL HIGH (ref 70–99)
Potassium: 4.4 meq/L (ref 3.5–5.1)
Sodium: 141 meq/L (ref 135–145)

## 2023-01-16 LAB — CBC WITH DIFFERENTIAL/PLATELET
Basophils Absolute: 0 10*3/uL (ref 0.0–0.1)
Basophils Relative: 0.9 % (ref 0.0–3.0)
Eosinophils Absolute: 0.1 10*3/uL (ref 0.0–0.7)
Eosinophils Relative: 2.7 % (ref 0.0–5.0)
HCT: 38 % (ref 36.0–46.0)
Hemoglobin: 11.6 g/dL — ABNORMAL LOW (ref 12.0–15.0)
Lymphocytes Relative: 33.2 % (ref 12.0–46.0)
Lymphs Abs: 1.3 10*3/uL (ref 0.7–4.0)
MCHC: 30.4 g/dL (ref 30.0–36.0)
MCV: 95.7 fl (ref 78.0–100.0)
Monocytes Absolute: 0.3 10*3/uL (ref 0.1–1.0)
Monocytes Relative: 8.6 % (ref 3.0–12.0)
Neutro Abs: 2.2 10*3/uL (ref 1.4–7.7)
Neutrophils Relative %: 54.6 % (ref 43.0–77.0)
Platelets: 196 10*3/uL (ref 150.0–400.0)
RBC: 3.97 Mil/uL (ref 3.87–5.11)
RDW: 17.8 % — ABNORMAL HIGH (ref 11.5–15.5)
WBC: 4 10*3/uL (ref 4.0–10.5)

## 2023-01-16 MED ORDER — OXYCODONE HCL 5 MG PO TABS: 5.0000 mg | ORAL_TABLET | Freq: Four times a day (QID) | ORAL | 0 refills | Status: DC | PRN

## 2023-01-16 NOTE — Progress Notes (Signed)
Arthritis degenerative change in lower back  no fracture or lesions  Advie next step is see dr August Saucer and his team for advice help.

## 2023-01-16 NOTE — Progress Notes (Signed)
Chief Complaint  Patient presents with   Medical Management of Chronic Issues    2 months follow up.    Back Pain    Pt c/o lower back pain. Going on over 2 wks.    Arm Pain    Pt c/o R arm pain. Noticed it over 2 wks. Taking tylenol. No relief    HPI: [name] 89 y.o. come in for Chronic disease management  and new problems  here with daughter  Onset a few weeks ago of lbp feels severe  without injury and no change with  up or sitting . 8-10/10 at times . Using tylenol and topicals with out sig releif Also right upper arem pain  near deltoid has hx of  shoulder surgery  Dr August Saucer.    HT  stays only taken hydralazine bid cause makes her feel badly if taking tid .  No reisdual sx of  pna from last  time due for fu cxray soon   ROS: See pertinent positives and negatives per HPI. No uti sx   fever    Past Medical History:  Diagnosis Date   Acute superficial venous thrombosis of left lower extremity 03/27/2014   concern about hx and potential extensive on exam tenderness calf and low dose lovenox 40 qd 2-4 weekselevetion and close  fu advised .    Allergy    Anemia    Anxiety    Arthritis    "shoulders" (09/09/2012)   Asthma 09/08/2012   Carotid bruit    [place] 2018  low risk 1 - 39%   Cataract    bil cateracts removed   Chronic kidney disease    Clotting disorder (HCC)    blood clots in legs   Diabetes mellitus without complication (HCC)    "Borderline" per pt; being monitored.  no meds per pt   Diverticulosis    Dyspnea 04/27/2014   GERD (gastroesophageal reflux disease)    Headache(784.0)    "related to my high blood pressure" (09/09/2012)   Hiatal hernia    History of DVT (deep vein thrombosis)    "RLE" (09/09/2012) coumadine cant take asa so on plavix    HTN (hypertension) 09/08/2012   Hyperlipidemia    Lupus (HCC)    "cured years ago" (09/09/2012)   Other dysphagia 02/11/2013   Syncope and collapse 01/21/2018   Varicose veins    Varicose veins of leg with  complications 12/05/2010    Family History  Problem Relation Age of Onset   Hypertension Mother    Lung cancer Mother    Hypertension Father    Lung cancer Father    Breast cancer Sister    Colon cancer Sister        ? 37' s dx - died in 38's   Breast cancer Sister    Hypertension Brother    Rectal cancer Brother    Stomach cancer Brother    Breast cancer Brother    Heart attack Daughter    Esophageal cancer Daughter    Hypertension Daughter    Diabetes Daughter    Breast cancer Paternal Aunt    Lung cancer Other        both parents    Pancreatic cancer Neg Hx     Social History   Socioeconomic History   Marital status: Single    Spouse name: Not on file   Number of children: 6   Years of education: Not on file   Highest education level: Not  on file  Occupational History   Occupation: retired  Tobacco Use   Smoking status: Never   Smokeless tobacco: Never  Vaping Use   Vaping status: Never Used  Substance and Sexual Activity   Alcohol use: No   Drug use: No   Sexual activity: Not Currently    Birth control/protection: Surgical  Other Topics Concern   Not on file  Social History Narrative   2 people living in the home.  Grand daughter.   Up and down through the night      Had 7 children  2 deceased . 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    works 3 days per week currently .   Glasses dentures  Neg tad    Social Determinants of Health   Financial Resource Strain: Low Risk  (04/14/2022)   Overall Financial Resource Strain (CARDIA)    Difficulty of Paying Living Expenses: Not very hard  Food Insecurity: No Food Insecurity (12/01/2022)   Hunger Vital Sign    Worried About Running Out of Food in the Last Year: Never true    Ran Out of Food in the Last Year: Never true  Transportation Needs: No Transportation Needs (12/01/2022)   PRAPARE - Administrator, Civil Service  (Medical): No    Lack of Transportation (Non-Medical): No  Physical Activity: Not on file  Stress: Not on file  Social Connections: Not on file    Outpatient Medications Prior to Visit  Medication Sig Dispense Refill   acetaminophen (TYLENOL) 500 MG tablet Take 500 mg by mouth in the morning and at bedtime.     albuterol (PROVENTIL) (2.5 MG/3ML) 0.083% nebulizer solution Take 3 mLs (2.5 mg total) by nebulization every 6 (six) hours as needed for shortness of breath or wheezing. 360 mL 6   albuterol (VENTOLIN HFA) 108 (90 Base) MCG/ACT inhaler USE 2 PUFFS EVERY 6 HOURS AS NEEDED FOR WHEEZING. 8.5 g 3   amLODipine (NORVASC) 10 MG tablet Take 1 tablet (10 mg total) by mouth daily. 30 tablet 1   atorvastatin (LIPITOR) 40 MG tablet Take 1 tablet (40 mg total) by mouth daily. 90 tablet 3   atropine 1 % ophthalmic solution Place 1 drop into both eyes 2 (two) times daily.     Azelastine-Fluticasone 137-50 MCG/ACT SUSP PLACE ONE SPRAY IN EACH NOSTRIL TWICE DAILY (IN THE MORNING AND AT BEDTIME) 23 g 6   Budeson-Glycopyrrol-Formoterol (BREZTRI AEROSPHERE) 160-9-4.8 MCG/ACT AERO INHALE 2 PUFFS INTO THE LUNGS TWICE DAILY, IN THE MORNING & AT BEDTIME. 10.7 g 4   carvedilol (COREG) 12.5 MG tablet Take 1 tablet (12.5 mg total) by mouth 2 (two) times daily with a meal. 60 tablet 1   cetirizine (ZYRTEC) 10 MG tablet Take 10 mg by mouth daily as needed for allergies or rhinitis.     citalopram (CELEXA) 20 MG tablet Take 1 tablet (20 mg total) by mouth every evening. 90 tablet 0   clopidogrel (PLAVIX) 75 MG tablet TAKE ONE TABLET BY MOUTH ONCE DAILY. 30 tablet 1   diclofenac Sodium (VOLTAREN) 1 % GEL Apply 4 g topically 2 (two) times daily as needed (painful areas). 50 g 1   doxazosin (CARDURA) 4 MG tablet Take 1 tablet (4 mg total) by mouth every 12 (twelve) hours. 60 tablet 1   ferrous sulfate 325 (65 FE) MG EC tablet Take 1 tablet (325 mg total)  by mouth daily with breakfast. 30 tablet 3   fluticasone  (FLONASE) 50 MCG/ACT nasal spray Place 2 sprays into both nostrils as needed for allergies or rhinitis.     folic acid (FOLVITE) 1 MG tablet Take 1 tablet (1 mg total) by mouth daily. 30 tablet 1   furosemide (LASIX) 40 MG tablet Take 2 tablets (80 mg total) by mouth every morning AND 1.5 tablets (60 mg total) every evening. 180 tablet 4   hydrALAZINE (APRESOLINE) 100 MG tablet Take 1 tablet (100 mg total) by mouth 3 (three) times daily. 90 tablet 1   LORazepam (ATIVAN) 0.5 MG tablet TAKE ONE TABLET BY MOUTH TWICE DAILY AS NEEDED FOR ANXIETY 30 tablet 0   montelukast (SINGULAIR) 10 MG tablet Take 1 tablet (10 mg total) by mouth daily. 90 tablet 0   pantoprazole (PROTONIX) 40 MG tablet TAKE ONE TABLET BY MOUTH TWICE DAILY 180 tablet 1   potassium chloride (KLOR-CON M) 10 MEQ tablet Take 1 tablet (10 mEq total) by mouth daily. 30 tablet 1   RESTASIS 0.05 % ophthalmic emulsion Place 1 drop into both eyes 2 (two) times daily.     sodium bicarbonate 650 MG tablet Take 1 tablet (650 mg total) by mouth 2 (two) times daily. 60 tablet 1   Spacer/Aero-Holding Chambers (AEROCHAMBER PLUS) inhaler Use as instructed (Patient taking differently: Use as instructed as needed) 1 each 2   vitamin B-12 (CYANOCOBALAMIN) 1000 MCG tablet Take 1,000 mcg by mouth daily.     Vitamin D, Cholecalciferol, 25 MCG (1000 UT) TABS Take 1,000 Units by mouth daily.     No facility-administered medications prior to visit.     EXAM:  BP (!) 166/56 (BP Location: Left Arm, Patient Position: Sitting, Cuff Size: Normal)   Pulse (!) 59   Temp 97.8 F (36.6 C) (Oral)   Ht 5' (1.524 m)   Wt 144 lb 9.6 oz (65.6 kg)   SpO2 99%   BMI 28.24 kg/m   Body mass index is 28.24 kg/m. Walsk with cane  co knee pain and back pain  GENERAL: vitals reviewed and listed above, alert, oriented, appears well hydrated and in no acute distress HEENT: atraumatic, conjunctiva  clear, no obvious abnormalities on inspection of external nose and  ears NECK: no obvious masses on inspection palpation  LUNGS: clear to auscultation bilaterally, no wheezes, rales or rhonchi, good air movement CV: HRRR, no clubbing cyanosis nl cap refill  MS: moves all extremities without noticeable focal  abnormality favors knees  shoulder ok onroation  Lback no point tenderness  locates central and paracentral area s of pan  PSYCH: pleasant and cooperative, no obvious depression or anxiety Lab Results  Component Value Date   WBC 3.9 (L) 01/02/2023   HGB 10.4 (L) 01/02/2023   HCT 33.7 (L) 01/02/2023   PLT 172 01/02/2023   GLUCOSE 109 (H) 12/15/2022   CHOL 190 07/21/2022   TRIG 97 07/21/2022   HDL 92 07/21/2022   LDLCALC 81 07/21/2022   ALT 8 10/31/2022   AST 15 10/31/2022   NA 140 12/15/2022   K 3.9 12/15/2022   CL 101 12/15/2022   CREATININE 4.18 (H) 12/15/2022   BUN 54 (H) 12/15/2022   CO2 24 12/15/2022   TSH 2.85 06/29/2022   INR 1.0 10/31/2022   HGBA1C 5.3 06/29/2022   MICROALBUR 119 03/02/2021   BP Readings from Last 3 Encounters:  01/16/23 (!) 166/56  01/02/23 (!) 151/50  12/22/22 139/67    ASSESSMENT AND  PLAN:  Discussed the following assessment and plan:  Bilateral low back pain, unspecified chronicity, unspecified whether sciatica present - increasing lbp for weeks without injury but 8-10/10 using tylenol and topicals - Plan: DG Lumbar Spine Complete, Basic metabolic panel, CBC with Differential/Platelet, Urinalysis, Routine w reflex microscopic  Pain of right upper extremity  Chronic kidney disease (CKD), stage IV (severe) (HCC) - cr more elevated a month ago post hosp - Plan: DG Lumbar Spine Complete, Basic metabolic panel, CBC with Differential/Platelet, Urinalysis, Routine w reflex microscopic  History of community acquired pneumonia - need fu x ray will do today - Plan: DG Chest 2 View  Anemia, unspecified type - Plan: Basic metabolic panel, CBC with Differential/Platelet  Pulmonary hypertension, unspecified  (HCC)  Medication management - tyr taking 50 mg mid day of hydralazine instead of skipping entire dose - Plan: DG Lumbar Spine Complete  Diastolic congestive heart failure, unspecified HF chronicity (HCC) - Plan: DG Lumbar Spine Complete  -Patient advised to return or notify health care team  if  new concerns arise.  Patient Instructions  Checking x ray today for back pain and updated kidney function.  Will get info Dr. [name]. . Can continue  tylenol  and pathces  and can add if needed pain medication with caution.   This could be flare of arthritis but evaluating for other causes also .   May end of having  you see Dr August Saucer again about the arm and even back if need. Next steps.  In regard to BP medication  may be try adding 50 mg instead of 100 in day dose.      Neta Mends. Louanna Vanliew M.D.

## 2023-01-16 NOTE — Telephone Encounter (Signed)
CRITICAL VALUE STICKER  CRITICAL VALUE: critically low GFR  10.18  RECEIVER (on-site recipient of call): Tay  DATE & TIME NOTIFIED: 3:21 pm 01/16/23  MESSENGER (representative from lab): Crista Luria  MD NOTIFIED: Fabian Sharp  TIME OF NOTIFICATION: 3:22 pm 01/16/23  RESPONSE:

## 2023-01-16 NOTE — Progress Notes (Signed)
Kidney function stable low gfr 10 anemia improved,  x ray shows arthritis and no alarm sx chest  x ray  shows scarring and no active disease. Sharing with Dr [name].please let team know if BP not coming down So I advise you make appt with Dr August Saucer practice to check your back and right arm  for help in managing .

## 2023-01-16 NOTE — Progress Notes (Signed)
Scarring in lung no active issues   . reassuring

## 2023-01-16 NOTE — Patient Instructions (Addendum)
Checking x ray today for back pain and updated kidney function.  Will get info Dr. [name]. . Can continue  tylenol  and pathces  and can add if needed pain medication with caution.   This could be flare of arthritis but evaluating for other causes also .   May end of having  you see Dr August Saucer again about the arm and even back if need. Next steps.  In regard to BP medication  may be try adding 50 mg instead of 100 in day dose.

## 2023-01-18 NOTE — Addendum Note (Signed)
Addended by: Vickii Chafe on: 01/18/2023 08:08 AM   Modules accepted: Orders

## 2023-01-19 NOTE — Telephone Encounter (Signed)
See note   no sig change . Results  to be sent to dr [name]  team

## 2023-01-25 ENCOUNTER — Other Ambulatory Visit (HOSPITAL_COMMUNITY): Payer: Self-pay

## 2023-01-26 NOTE — Progress Notes (Signed)
ADVANCED HEART FAILURE CLINIC NOTE  Primary Care: [name], MD Primary Cardiologist: Sherilyn Banker, MD HF Cardiologist: Dr. Gasper Lloyd  HPI: [name] is a 89 y.o. female with heart failure with preserved ejection fraction, moderate persistent asthma, anemia of chronic disease followed by hematology, CKD 4, hyperlipidemia, hypertension. Initially saw Dr. Wayna Chalet 12/08/22 for evaluation of pulmonary hypertension and persistent volume overload secondary to chronic HFpEF and underlying CKD.  She was able to drive and perform all ADLs, limited by knee OA and dyspnea. She was volume overloaded and instructed to increase Lasix to 80/60, and add metolazone 2.5 mg daily x 3 days.  Today she returns for HF follow up with her grand-daughter. Overall feeling fatigued, also with low back pain. She is not SOB with ADLs, but feels fatigued. She has brisk diuresis with 3 days of metolazone use, she did not take KCL supp. Occasional palpitations and dizziness, no falls.  Chronically sleeps on 4 pillows. Denies CP, edema, or PND. Appetite ok. No fever or chills. She is not weighing at home. Taking all medications. Of note, no issues with UTI/yeast infections.   Activity level/exercise tolerance:  NYHA IIb-III Orthopnea:  Sleeps on 4 pillows Paroxysmal noctural dyspnea:  Yes, but improving Chest pain/pressure:  no Orthostatic lightheadedness:  Yes Palpitations:  Yes Lower extremity edema:  yes Presyncope/syncope: No Cough:  No  Past Medical History:  Diagnosis Date   Acute superficial venous thrombosis of left lower extremity 03/27/2014   concern about hx and potential extensive on exam tenderness calf and low dose lovenox 40 qd 2-4 weekselevetion and close  fu advised .    Allergy    Anemia    Anxiety    Arthritis    "shoulders" (09/09/2012)   Asthma 09/08/2012   Carotid bruit    [place] 2018  low risk 1 - 39%   Cataract    bil cateracts removed   Chronic kidney disease    Clotting  disorder (HCC)    blood clots in legs   Diabetes mellitus without complication (HCC)    "Borderline" per pt; being monitored.  no meds per pt   Diverticulosis    Dyspnea 04/27/2014   GERD (gastroesophageal reflux disease)    Headache(784.0)    "related to my high blood pressure" (09/09/2012)   Hiatal hernia    History of DVT (deep vein thrombosis)    "RLE" (09/09/2012) coumadine cant take asa so on plavix    HTN (hypertension) 09/08/2012   Hyperlipidemia    Lupus (HCC)    "cured years ago" (09/09/2012)   Other dysphagia 02/11/2013   Syncope and collapse 01/21/2018   Varicose veins    Varicose veins of leg with complications 12/05/2010    Current Outpatient Medications  Medication Sig Dispense Refill   acetaminophen (TYLENOL) 500 MG tablet Take 500 mg by mouth in the morning and at bedtime.     albuterol (PROVENTIL) (2.5 MG/3ML) 0.083% nebulizer solution Take 3 mLs (2.5 mg total) by nebulization every 6 (six) hours as needed for shortness of breath or wheezing. 360 mL 6   albuterol (VENTOLIN HFA) 108 (90 Base) MCG/ACT inhaler USE 2 PUFFS EVERY 6 HOURS AS NEEDED FOR WHEEZING. 8.5 g 3   amLODipine (NORVASC) 10 MG tablet Take 1 tablet (10 mg total) by mouth daily. 30 tablet 1   amLODipine (NORVASC) 10 MG tablet Take 1 tablet (10 mg total) by mouth daily. 180 tablet 3   atorvastatin (LIPITOR) 40 MG tablet Take 1 tablet (  40 mg total) by mouth daily. 90 tablet 3   atropine 1 % ophthalmic solution Place 1 drop into both eyes 2 (two) times daily.     Azelastine-Fluticasone 137-50 MCG/ACT SUSP PLACE ONE SPRAY IN EACH NOSTRIL TWICE DAILY (IN THE MORNING AND AT BEDTIME) 23 g 6   Budeson-Glycopyrrol-Formoterol (BREZTRI AEROSPHERE) 160-9-4.8 MCG/ACT AERO INHALE 2 PUFFS INTO THE LUNGS TWICE DAILY, IN THE MORNING & AT BEDTIME. 10.7 g 4   carvedilol (COREG) 12.5 MG tablet Take 1 tablet (12.5 mg total) by mouth 2 (two) times daily with a meal. 60 tablet 1   cetirizine (ZYRTEC) 10 MG tablet Take 10 mg by  mouth daily as needed for allergies or rhinitis.     citalopram (CELEXA) 20 MG tablet Take 1 tablet (20 mg total) by mouth every evening. 90 tablet 0   clopidogrel (PLAVIX) 75 MG tablet TAKE ONE TABLET BY MOUTH ONCE DAILY. 30 tablet 1   diclofenac Sodium (VOLTAREN) 1 % GEL Apply 4 g topically 2 (two) times daily as needed (painful areas). 50 g 1   doxazosin (CARDURA) 4 MG tablet Take 1 tablet (4 mg total) by mouth every 12 (twelve) hours. 60 tablet 1   ferrous sulfate 325 (65 FE) MG EC tablet Take 1 tablet (325 mg total) by mouth daily with breakfast. 30 tablet 3   fluticasone (FLONASE) 50 MCG/ACT nasal spray Place 2 sprays into both nostrils as needed for allergies or rhinitis.     folic acid (FOLVITE) 1 MG tablet Take 1 tablet (1 mg total) by mouth daily. 30 tablet 1   furosemide (LASIX) 40 MG tablet Take 2 tablets (80 mg total) by mouth every morning AND 1.5 tablets (60 mg total) every evening. 180 tablet 4   hydrALAZINE (APRESOLINE) 100 MG tablet Take 1 tablet (100 mg total) by mouth 3 (three) times daily. 90 tablet 1   LORazepam (ATIVAN) 0.5 MG tablet TAKE ONE TABLET BY MOUTH TWICE DAILY AS NEEDED FOR ANXIETY 30 tablet 0   montelukast (SINGULAIR) 10 MG tablet Take 1 tablet (10 mg total) by mouth daily. 90 tablet 0   oxyCODONE (OXY IR/ROXICODONE) 5 MG immediate release tablet Take 1 tablet (5 mg total) by mouth every 6 (six) hours as needed for severe pain. 20 tablet 0   pantoprazole (PROTONIX) 40 MG tablet TAKE ONE TABLET BY MOUTH TWICE DAILY 180 tablet 1   potassium chloride (KLOR-CON M) 10 MEQ tablet Take 1 tablet (10 mEq total) by mouth daily. 30 tablet 1   RESTASIS 0.05 % ophthalmic emulsion Place 1 drop into both eyes 2 (two) times daily.     sodium bicarbonate 650 MG tablet Take 1 tablet (650 mg total) by mouth 2 (two) times daily. 60 tablet 1   Spacer/Aero-Holding Chambers (AEROCHAMBER PLUS) inhaler Use as instructed (Patient taking differently: Use as instructed as needed) 1 each 2    vitamin B-12 (CYANOCOBALAMIN) 1000 MCG tablet Take 1,000 mcg by mouth daily.     Vitamin D, Cholecalciferol, 25 MCG (1000 UT) TABS Take 1,000 Units by mouth daily.     No current facility-administered medications for this visit.   Allergies  Allergen Reactions   Penicillins Nausea And Vomiting and Other (See Comments)    Has patient had a PCN reaction causing immediate rash, facial/tongue/throat swelling, SOB or lightheadedness with hypotension: Y Has patient had a PCN reaction causing severe rash involving mucus membranes or skin necrosis: Y Has patient had a PCN reaction that required hospitalization: N Has patient had  a PCN reaction occurring within the last 10 years: N If all of the above answers are "NO", then may proceed with Cephalosporin use.    Aspirin Other (See Comments)    Wheezing Acetaminophen is OK    Social History   Socioeconomic History   Marital status: Single    Spouse name: Not on file   Number of children: 6   Years of education: Not on file   Highest education level: Not on file  Occupational History   Occupation: retired  Tobacco Use   Smoking status: Never   Smokeless tobacco: Never  Vaping Use   Vaping status: Never Used  Substance and Sexual Activity   Alcohol use: No   Drug use: No   Sexual activity: Not Currently    Birth control/protection: Surgical  Other Topics Concern   Not on file  Social History Narrative   2 people living in the home.  Grand daughter.   Up and down through the night      Had 7 children  2 deceased . 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    works 3 days per week currently .   Glasses dentures  Neg tad    Social Determinants of Health   Financial Resource Strain: Low Risk  (04/14/2022)   Overall Financial Resource Strain (CARDIA)    Difficulty of Paying Living Expenses: Not very hard  Food Insecurity: No Food Insecurity (12/01/2022)    Hunger Vital Sign    Worried About Running Out of Food in the Last Year: Never true    Ran Out of Food in the Last Year: Never true  Transportation Needs: No Transportation Needs (12/01/2022)   PRAPARE - Administrator, Civil Service (Medical): No    Lack of Transportation (Non-Medical): No  Physical Activity: Not on file  Stress: Not on file  Social Connections: Not on file  Intimate Partner Violence: Not At Risk (10/31/2022)   Humiliation, Afraid, Rape, and Kick questionnaire    Fear of Current or Ex-Partner: No    Emotionally Abused: No    Physically Abused: No    Sexually Abused: No   Family History  Problem Relation Age of Onset   Hypertension Mother    Lung cancer Mother    Hypertension Father    Lung cancer Father    Breast cancer Sister    Colon cancer Sister        ? 73' s dx - died in 67's   Breast cancer Sister    Hypertension Brother    Rectal cancer Brother    Stomach cancer Brother    Breast cancer Brother    Heart attack Daughter    Esophageal cancer Daughter    Hypertension Daughter    Diabetes Daughter    Breast cancer Paternal Aunt    Lung cancer Other        both parents    Pancreatic cancer Neg Hx    There were no vitals taken for this visit.  Wt Readings from Last 3 Encounters:  01/16/23 65.6 kg (144 lb 9.6 oz)  12/22/22 63.3 kg (139 lb 9.6 oz)  12/15/22 61.2 kg (135 lb)   PHYSICAL EXAM: General:  NAD. No resp difficulty, arrived in [facility], elderly HEENT: Normal Neck: Supple. No JVD. Carotids 2+ bilat; no bruits. No lymphadenopathy or thryomegaly appreciated. Cor: PMI nondisplaced. Regular rate & rhythm.  No rubs, gallops or murmurs. Lungs: Clear Abdomen: Soft, nontender, nondistended. No hepatosplenomegaly. No bruits or masses. Good bowel sounds. Extremities: No cyanosis, clubbing, rash, 1-2+ pedal edema Neuro: Alert & oriented x 3, cranial nerves grossly intact. Moves all 4 extremities w/o difficulty. Affect pleasant.  DATA  REVIEW  ECG: 10/31/22: NSR, borderline low voltage limb leads as per my read 12/15/22: ST elevation in V2 , (no elevation in contiguous leads) + PVCs, w/ inferior TWIs (old),   ECHO: 10/31/22: LVEF 60%, normal RV function. Moderate LVH, PASP as per my personal interpretation 11/14/18: LVEF 60-65%, normal RV function; PASP .    ASSESSMENT & PLAN:  Heart failure with preserved EF - TTE w/ moderate concentric LVH; based on chart review she has history of bilateral carpal tunnel release in 2015 - Due to underlying HFpEF, CKD, carpal tunnel surgery, borderline low voltage ECG and LVH, work up for cardiac amyloidosis initiated. Myeloma panel negative, PYP pending.  - NYHA IIb-III, functional status limited by general deconditioning and frailty She is not volume overloaded on exam, weight down 8 lbs. ReDs 30%. Suspect pedal edema 2/2 calcium channel blocker. - Continue lasix 80/60 + 10 KCL daily. - She had metolazone at home, discussed PRN use. - Add SGLT2i next visit when appetite improved. - Labs today.  2. Evaluation for PH - RV has normal function and morphology on TTE from 10/31/22. Elevated PASP likely secondary to chronically elevated left sided pressures.  No indication for invasive hemodynamics and treatment for PH therapy. PASP by TTE unchanged on TTEs dating from 2020-2024.  - At this time will focus on diuretics and management of HFpEF.  3. CKD IV - Baseline sCr 3-3.5, with a GFR persistently < 15.  - Will attempt to introduce SGLT2i next visit.   4. Hypertension  - BP elevated today, she has not had her AM meds.  - Continue amlodipine 10 mg daily.  - Continue Coreg 12.5 mg bid. - Continue hydralazine 100 mg tid. - Continue Cardura 4 mg bid. - Renal artery duplex from 11/23/17 negative for RAS - No changes at this time. - BMET today.  5. Abnormal ECG - ECG today shows new elevation in V2, no other elevation in contiguous leads with frequent PVCs (? Hyperkalemia).   - She does not endorse CP but with new ECG changes, she should be evaluated in the ED and HsTroponins trended.  - If admitted, favor Gen Cards.  Likely not LHC cath candidate with CKD IV and absence of clear ACS.  - Discussed with patient and her grand daughter. Discussed with Dr. Shirlee Latch  Follow up in 3-4 weeks with APP.  Prince Rome, FNP-BC 01/26/23

## 2023-01-29 ENCOUNTER — Encounter (HOSPITAL_COMMUNITY): Payer: Self-pay | Admitting: Cardiology

## 2023-01-29 ENCOUNTER — Ambulatory Visit (HOSPITAL_COMMUNITY)
Admission: RE | Admit: 2023-01-29 | Discharge: 2023-01-29 | Disposition: A | Discharge status: Home / Self Care | Payer: Medicare Other | Source: Ambulatory Visit | Attending: Family Medicine | Admitting: Family Medicine

## 2023-01-29 ENCOUNTER — Encounter (HOSPITAL_COMMUNITY): Payer: Self-pay

## 2023-01-29 VITALS — BP 138/74 | HR 59 | Wt 152.2 lb

## 2023-01-29 DIAGNOSIS — I130 Hypertensive heart and chronic kidney disease with heart failure and stage 1 through stage 4 chronic kidney disease, or unspecified chronic kidney disease: Secondary | ICD-10-CM | POA: Diagnosis not present

## 2023-01-29 DIAGNOSIS — I272 Pulmonary hypertension, unspecified: Secondary | ICD-10-CM | POA: Diagnosis not present

## 2023-01-29 DIAGNOSIS — N184 Chronic kidney disease, stage 4 (severe): Secondary | ICD-10-CM | POA: Insufficient documentation

## 2023-01-29 DIAGNOSIS — I10 Essential (primary) hypertension: Secondary | ICD-10-CM

## 2023-01-29 DIAGNOSIS — J4530 Mild persistent asthma, uncomplicated: Secondary | ICD-10-CM | POA: Diagnosis present

## 2023-01-29 DIAGNOSIS — D631 Anemia in chronic kidney disease: Secondary | ICD-10-CM | POA: Diagnosis not present

## 2023-01-29 DIAGNOSIS — E785 Hyperlipidemia, unspecified: Secondary | ICD-10-CM | POA: Insufficient documentation

## 2023-01-29 DIAGNOSIS — I5032 Chronic diastolic (congestive) heart failure: Secondary | ICD-10-CM | POA: Insufficient documentation

## 2023-01-29 MED ORDER — METOLAZONE 2.5 MG PO TABS
2.5000 mg | ORAL_TABLET | ORAL | 3 refills | Status: DC
Start: 2023-01-29 — End: 2023-02-26

## 2023-01-29 MED ORDER — POTASSIUM CHLORIDE CRYS ER 10 MEQ PO TBCR: 10.0000 meq | EXTENDED_RELEASE_TABLET | Freq: Every day | ORAL | 3 refills | Status: DC

## 2023-01-29 NOTE — Patient Instructions (Signed)
Start taking Metolozone 2.5 mg once weekly on Mondays. (Take dose today and tomorrow). Take extra 20 meq of potassium (3 pills total) on Mondays (day you take Metolazone). Take extra 2 potassium pills today and tomorrow. Labs in 7 - 10 days - see below. Return to [facility] in 4 weeks - see below. Return to see Dr. Victory Dakin in [facility] in 3 months - see below. Please call us at [phone] if any questions or concerns prior to your next visit.

## 2023-01-30 ENCOUNTER — Other Ambulatory Visit: Payer: Self-pay

## 2023-01-30 ENCOUNTER — Inpatient Hospital Stay: Payer: Medicare Other

## 2023-01-30 ENCOUNTER — Inpatient Hospital Stay: Payer: Medicare Other | Attending: Hematology

## 2023-01-30 VITALS — BP 152/58 | HR 63 | Temp 98.2°F | Resp 18

## 2023-01-30 DIAGNOSIS — I129 Hypertensive chronic kidney disease with stage 1 through stage 4 chronic kidney disease, or unspecified chronic kidney disease: Secondary | ICD-10-CM | POA: Diagnosis not present

## 2023-01-30 DIAGNOSIS — N184 Chronic kidney disease, stage 4 (severe): Secondary | ICD-10-CM | POA: Diagnosis not present

## 2023-01-30 DIAGNOSIS — Z79899 Other long term (current) drug therapy: Secondary | ICD-10-CM | POA: Insufficient documentation

## 2023-01-30 DIAGNOSIS — D638 Anemia in other chronic diseases classified elsewhere: Secondary | ICD-10-CM

## 2023-01-30 DIAGNOSIS — D631 Anemia in chronic kidney disease: Secondary | ICD-10-CM | POA: Insufficient documentation

## 2023-01-30 LAB — CBC WITH DIFFERENTIAL (CANCER CENTER ONLY)
Abs Immature Granulocytes: 0.02 10*3/uL (ref 0.00–0.07)
Basophils Absolute: 0 10*3/uL (ref 0.0–0.1)
Basophils Relative: 1 %
Eosinophils Absolute: 0.1 10*3/uL (ref 0.0–0.5)
Eosinophils Relative: 4 %
HCT: 34 % — ABNORMAL LOW (ref 36.0–46.0)
Hemoglobin: 10.9 g/dL — ABNORMAL LOW (ref 12.0–15.0)
Immature Granulocytes: 1 %
Lymphocytes Relative: 31 %
Lymphs Abs: 0.9 10*3/uL (ref 0.7–4.0)
MCH: 30.1 pg (ref 26.0–34.0)
MCHC: 32.1 g/dL (ref 30.0–36.0)
MCV: 93.9 fL (ref 80.0–100.0)
Monocytes Absolute: 0.2 10*3/uL (ref 0.1–1.0)
Monocytes Relative: 8 %
Neutro Abs: 1.6 10*3/uL — ABNORMAL LOW (ref 1.7–7.7)
Neutrophils Relative %: 55 %
Platelet Count: 163 10*3/uL (ref 150–400)
RBC: 3.62 MIL/uL — ABNORMAL LOW (ref 3.87–5.11)
RDW: 14.8 % (ref 11.5–15.5)
WBC Count: 2.9 10*3/uL — ABNORMAL LOW (ref 4.0–10.5)
nRBC: 0 % (ref 0.0–0.2)

## 2023-01-30 MED ORDER — DARBEPOETIN ALFA 100 MCG/0.5ML IJ SOSY
100.0000 ug | PREFILLED_SYRINGE | Freq: Once | INTRAMUSCULAR | Status: AC
  Administered 2023-01-30: 100 ug via SUBCUTANEOUS
  Filled 2023-01-30: qty 0.5

## 2023-01-30 NOTE — Patient Instructions (Signed)

## 2023-02-05 ENCOUNTER — Telehealth: Payer: Self-pay

## 2023-02-05 NOTE — Telephone Encounter (Signed)
-----   Message from Burnard Bunting sent at 02/05/2023  9:55 AM EDT ----- Hi Justus Duerr is she coming in?

## 2023-02-05 NOTE — Progress Notes (Signed)
Hi [name] is she coming in?

## 2023-02-05 NOTE — Telephone Encounter (Signed)
Please call and schedule OV to review scan

## 2023-02-06 NOTE — Telephone Encounter (Signed)
Appt on 10/9

## 2023-02-07 DIAGNOSIS — N2581 Secondary hyperparathyroidism of renal origin: Secondary | ICD-10-CM | POA: Diagnosis not present

## 2023-02-07 DIAGNOSIS — D631 Anemia in chronic kidney disease: Secondary | ICD-10-CM | POA: Diagnosis not present

## 2023-02-07 DIAGNOSIS — I120 Hypertensive chronic kidney disease with stage 5 chronic kidney disease or end stage renal disease: Secondary | ICD-10-CM | POA: Diagnosis not present

## 2023-02-07 DIAGNOSIS — N189 Chronic kidney disease, unspecified: Secondary | ICD-10-CM | POA: Diagnosis not present

## 2023-02-07 DIAGNOSIS — N185 Chronic kidney disease, stage 5: Secondary | ICD-10-CM | POA: Diagnosis not present

## 2023-02-08 ENCOUNTER — Ambulatory Visit (HOSPITAL_COMMUNITY)
Admission: RE | Admit: 2023-02-08 | Discharge: 2023-02-08 | Disposition: A | Discharge status: Home / Self Care | Payer: Medicare Other | Source: Ambulatory Visit | Attending: Family Medicine | Admitting: Family Medicine

## 2023-02-08 DIAGNOSIS — I5032 Chronic diastolic (congestive) heart failure: Secondary | ICD-10-CM | POA: Diagnosis not present

## 2023-02-08 LAB — BASIC METABOLIC PANEL
Anion gap: 11 (ref 5–15)
BUN: 64 mg/dL — ABNORMAL HIGH (ref 8–23)
CO2: 19 mmol/L — ABNORMAL LOW (ref 22–32)
Calcium: 8.7 mg/dL — ABNORMAL LOW (ref 8.9–10.3)
Chloride: 110 mmol/L (ref 98–111)
Creatinine, Ser: 4.2 mg/dL — ABNORMAL HIGH (ref 0.44–1.00)
GFR, Estimated: 10 mL/min — ABNORMAL LOW (ref >60)
Glucose, Bld: 115 mg/dL — ABNORMAL HIGH (ref 70–99)
Potassium: 4.1 mmol/L (ref 3.5–5.1)
Sodium: 140 mmol/L (ref 135–145)

## 2023-02-21 ENCOUNTER — Ambulatory Visit: Payer: Medicare Other | Admitting: Orthopedic Surgery

## 2023-02-21 ENCOUNTER — Other Ambulatory Visit: Payer: Self-pay | Admitting: Orthopedic Surgery

## 2023-02-21 ENCOUNTER — Other Ambulatory Visit (INDEPENDENT_AMBULATORY_CARE_PROVIDER_SITE_OTHER): Payer: Medicare Other

## 2023-02-21 ENCOUNTER — Other Ambulatory Visit: Payer: Self-pay

## 2023-02-21 ENCOUNTER — Telehealth: Payer: Self-pay | Admitting: Orthopedic Surgery

## 2023-02-21 DIAGNOSIS — M19011 Primary osteoarthritis, right shoulder: Secondary | ICD-10-CM | POA: Diagnosis not present

## 2023-02-21 DIAGNOSIS — M545 Low back pain, unspecified: Secondary | ICD-10-CM | POA: Diagnosis not present

## 2023-02-21 DIAGNOSIS — M25511 Pain in right shoulder: Secondary | ICD-10-CM

## 2023-02-21 MED ORDER — ACETAMINOPHEN-CODEINE 300-30 MG PO TABS
1.0000 | ORAL_TABLET | Freq: Three times a day (TID) | ORAL | 0 refills | Status: DC | PRN
Start: 2023-02-21 — End: 2023-02-21

## 2023-02-21 MED ORDER — ACETAMINOPHEN-CODEINE 300-30 MG PO TABS: 1.0000 | ORAL_TABLET | Freq: Three times a day (TID) | ORAL | 0 refills | Status: DC | PRN

## 2023-02-21 NOTE — Telephone Encounter (Signed)
T 3 sent

## 2023-02-21 NOTE — Telephone Encounter (Signed)
Patient called wanting something for pain. CB# [phone]. Her pharmacy is [facility].

## 2023-02-22 ENCOUNTER — Encounter: Payer: Self-pay | Admitting: Orthopedic Surgery

## 2023-02-22 NOTE — Progress Notes (Signed)
Office Visit Note   Patient: Lakeena Morawski           Date of Birth: March 21, 1934           MRN: [id number] Visit Date: 02/21/2023 Requested by: [name], MD [address] [place],  [place] [address] PCP: [name], MD  Subjective: Chief Complaint  Patient presents with   Lower Back - Pain    HPI: Petrea Chanda is a 89 y.o. female who presents to the office reporting low back pain.  Patient had prior epidural steroid injection in 2022.  Reports radicular pain right worse than left.  She reports some lower extremity swelling as well which may or may not be related to her back pain.  Denies any numbness and tingling.  She ambulates with a cane.  The pain does wake her from sleep at night.  She is not taking currently any blood thinners but in her record she does have a history of being on Plavix.  She states that she has not taken that in several weeks.  This part of the history is a little unclear.  Tylenol has not been helpful.  She also reports some right shoulder pain with a known history of rotator cuff arthropathy and arthritis in that shoulder..                ROS: All systems reviewed are negative as they relate to the chief complaint within the history of present illness.  Patient denies fevers or chills.  Assessment & Plan: Visit Diagnoses:  1. Low back pain, unspecified back pain laterality, unspecified chronicity, unspecified whether sciatica present   2. Right shoulder pain, unspecified chronicity     Plan: Impression is low back pain with right-sided radiculopathy.  Plan to refer her to Dr. Alvester Morin for lumbar spine ESI.  Plavix history will have to be ironed out prior to that injection.  Ultrasound injection into the right glenohumeral joint also performed today.  She will follow-up with [place] as needed.  One-time prescription for pain medicine given.  Follow-Up Instructions: No follow-ups on file.   Orders:  Orders Placed This Encounter  Procedures    XR Lumbar Spine 2-3 Views   US Guided Needle Placement - No Linked Charges   Ambulatory referral to Physical Medicine Rehab   No orders of the defined types were placed in this encounter.     Procedures: Large Joint Inj: R glenohumeral on 02/21/2023 6:37 AM Indications: diagnostic evaluation and pain Details: 22 G 1.5 in needle, ultrasound-guided posterior approach  Arthrogram: No  Medications: 9 mL bupivacaine 0.5 %; 40 mg methylPREDNISolone acetate 40 MG/ML; 5 mL lidocaine 1 % Outcome: tolerated well, no immediate complications Procedure, treatment alternatives, risks and benefits explained, specific risks discussed. Consent was given by the patient. Immediately prior to procedure a time out was called to verify the correct patient, procedure, equipment, support staff and site/side marked as required. Patient was prepped and draped in the usual sterile fashion.       Clinical Data: No additional findings.  Objective: Vital Signs: There were no vitals taken for this visit.  Physical Exam:  Constitutional: Patient appears well-developed HEENT:  Head: Normocephalic Eyes:EOM are normal Neck: Normal range of motion Cardiovascular: Normal rate Pulmonary/chest: Effort normal Neurologic: Patient is alert Skin: Skin is warm Psychiatric: Patient has normal mood and affect  Ortho Exam: Ortho exam demonstrates 5 out of 5 ankle dorsiflexion plantarflexion quad and hamstring strength.  No groin  pain with internal/external rotation of the leg.  Pedal pulses palpable.  1+ pitting edema present bilateral lower extremities.  She has no nerve root tension signs in the lower extremities.  Right shoulder demonstrates mild crepitus with passive range of motion at 90 degrees of abduction.  Deltoid is functional.  No restriction of passive external rotation at 90 degrees of abduction or 15 degrees of abduction.  Specialty Comments:  No specialty comments available.  Imaging: XR Lumbar  Spine 2-3 Views  Result Date: 02/22/2023 AP lateral radiographs lumbar spine reviewed.  Slight scoliosis present in the AP view.  No significant spondylolisthesis or compression fractures.  There is significant degenerative disc disease between the vertebral bodies as well as in the facet joints throughout the lumbar spine.  Bone slightly osteopenic.  US Guided Needle Placement - No Linked Charges  Result Date: 02/22/2023 Ultrasound imaging demonstrates needle placement into the right glenohumeral joint with extravasation into the joint and no complicating features    PMFS History: Patient Active Problem List   Diagnosis Date Noted   CAP (community acquired pneumonia) 10/31/2022   Diastolic congestive heart failure (HCC) 10/31/2022   Hypertensive urgency 10/31/2022   Moderate persistent asthma 10/31/2022   Hypomagnesemia 10/31/2022   CKD (chronic kidney disease), stage IV (HCC) 10/31/2022   Pulmonary hypertension, unspecified (HCC) 12/06/2021   Chronic cough 10/18/2021   Chronic rhinitis 11/24/2019   Nausea 11/03/2018   Elevated troponin 11/03/2018   Hyponatremia 11/03/2018   Hypertensive heart disease with hypertensive chronic kidney disease 11/30/2017   Fasting hyperglycemia 11/30/2017   Renal cyst 03/27/2016   CKD (chronic kidney disease) stage 3, GFR 30-59 ml/min (HCC) 08/23/2015   Subjective hearing change 06/29/2014   Resistant hypertension 06/29/2014   Lumbago 06/15/2014   Pre-diabetes vs early DM  06/15/2014   Dyspnea 04/27/2014   Asthma, chronic 04/13/2014   Elevated uric acid in blood 04/13/2014   Essential hypertension 03/27/2014   History of DVT (deep vein thrombosis)    Palpitations 01/05/2014   Anemia of chronic disease 01-01-2014   Death of family member 2013-09-05   Leg edema 06/20/2013   Bereavement due to life event 04/21/2013   Other dysphagia 02/11/2013   Colon cancer screening 02/11/2013   Anxiety state 01/20/2013   Leg cramps 01/20/2013   Back  pain 12/05/2012   Family hx of colon cancer 10/21/2012   Family hx of lung cancer 10/21/2012   Family hx-breast malignancy 10/21/2012   Renal insufficiency creatinine 1.3 10/21/2012   Anemia, chronic disease 10/21/2012   GERD (gastroesophageal reflux disease) 09/08/2012   HTN (hypertension) 09/08/2012   Hyperlipidemia 09/08/2012   Varicose veins of leg with complications 12/05/2010   Past Medical History:  Diagnosis Date   Acute superficial venous thrombosis of left lower extremity 03/27/2014   concern about hx and potential extensive on exam tenderness calf and low dose lovenox 40 qd 2-4 weekselevetion and close  fu advised .    Allergy    Anemia    Anxiety    Arthritis    "shoulders" (09/09/2012)   Asthma 09/08/2012   Carotid bruit    [place] 2018  low risk 1 - 39%   Cataract    bil cateracts removed   Chronic kidney disease    Clotting disorder (HCC)    blood clots in legs   Diabetes mellitus without complication (HCC)    "Borderline" per pt; being monitored.  no meds per pt   Diverticulosis    Dyspnea 04/27/2014   GERD (  gastroesophageal reflux disease)    Headache(784.0)    "related to my high blood pressure" (09/09/2012)   Hiatal hernia    History of DVT (deep vein thrombosis)    "RLE" (09/09/2012) coumadine cant take asa so on plavix    HTN (hypertension) 09/08/2012   Hyperlipidemia    Lupus    "cured years ago" (09/09/2012)   Other dysphagia 02/11/2013   Syncope and collapse 01/21/2018   Varicose veins    Varicose veins of leg with complications 12/05/2010    Family History  Problem Relation Age of Onset   Hypertension Mother    Lung cancer Mother    Hypertension Father    Lung cancer Father    Breast cancer Sister    Colon cancer Sister        ? 52' s dx - died in 46's   Breast cancer Sister    Hypertension Brother    Rectal cancer Brother    Stomach cancer Brother    Breast cancer Brother    Heart attack Daughter    Esophageal cancer Daughter     Hypertension Daughter    Diabetes Daughter    Breast cancer Paternal Aunt    Lung cancer Other        both parents    Pancreatic cancer Neg Hx     Past Surgical History:  Procedure Laterality Date   ABDOMINAL HYSTERECTOMY     partial   BREAST EXCISIONAL BIOPSY Right    BREAST SURGERY     CARPAL TUNNEL RELEASE Right 2000's   CARPAL TUNNEL RELEASE Bilateral 10/21/2013   Procedure: BILATERAL  CARPAL TUNNEL RELEASE;  Surgeon: Nicki Reaper, MD;  Location: [facility];  Service: Orthopedics;  Laterality: Bilateral;   COLONOSCOPY     SHOULDER OPEN ROTATOR CUFF REPAIR Bilateral 2000's   TRIGGER FINGER RELEASE Right 10/21/2013   Procedure: RELEASE TRIGGER FINGER/A-1 PULLEY RIGHT MIDDLE AND RIGHT RING;  Surgeon: Nicki Reaper, MD;  Location: [facility];  Service: Orthopedics;  Laterality: Right;   UPPER GASTROINTESTINAL ENDOSCOPY     Social History   Occupational History   Occupation: retired  Tobacco Use   Smoking status: Never   Smokeless tobacco: Never  Vaping Use   Vaping status: Never Used  Substance and Sexual Activity   Alcohol use: No   Drug use: No   Sexual activity: Not Currently    Birth control/protection: Surgical

## 2023-02-22 NOTE — Telephone Encounter (Signed)
Pt was advised.

## 2023-02-23 DIAGNOSIS — H40023 Open angle with borderline findings, high risk, bilateral: Secondary | ICD-10-CM | POA: Diagnosis not present

## 2023-02-23 NOTE — Progress Notes (Signed)
ADVANCED HEART FAILURE CLINIC NOTE  Primary Care: [name], MD Primary Cardiologist: Sherilyn Banker, MD HF Cardiologist: Dr. Gasper Lloyd  HPI: [name] is a 89 y.o. female with heart failure with preserved ejection fraction, moderate persistent asthma, anemia of chronic disease followed by hematology, CKD 4, hyperlipidemia, hypertension. Initially saw Dr. Wayna Chalet 12/08/22 for evaluation of pulmonary hypertension and persistent volume overload secondary to chronic HFpEF and underlying CKD.  She was able to drive and perform all ADLs, limited by knee OA and dyspnea. She was volume overloaded and instructed to increase Lasix to 80/60, and add metolazone 2.5 mg daily x 3 days.  Follow up 8/24, she had brisk diuresis with 3 days of metolazone use, weight down 8 lbs with stable NYHA IIb symptoms. ECG showed new elevation in V2. Advised eval in ED due to abundance of caution. In ED, HsTroponins elevated (no clear ACS), but felt to be 2/2 to renal disease. No significant ECG changes and she was discharged home.  Today she returns for HF follow up with her family member. She was volume overloaded last visit and I asked her to take 2 doses of her metolazone that week.  Now, feeling fine. She has SOB walking further distances on flat ground, she attributes her SOB to her asthma. She has new occasional dizziness, no falls. Denies CP, edema, or PND/Orthopnea. She chronically sleeps reclined.  Appetite decreased, continues to struggle with dulled taste.  No fever or chills. She does not have a scale. Taking all medications. BP at home 140s systolic.  Activity level/exercise tolerance:  NYHA II-IIb Orthopnea:  Chronically sleeps reclined. Paroxysmal noctural dyspnea:  Occasional. Chest pain/pressure:  No Orthostatic lightheadedness:  Occasional Palpitations:  No Lower extremity edema:  Yes Presyncope/syncope: No Cough:  No  Past Medical History:  Diagnosis Date   Acute superficial venous  thrombosis of left lower extremity 03/27/2014   concern about hx and potential extensive on exam tenderness calf and low dose lovenox 40 qd 2-4 weekselevetion and close  fu advised .    Allergy    Anemia    Anxiety    Arthritis    "shoulders" (09/09/2012)   Asthma 09/08/2012   Carotid bruit    [place] 2018  low risk 1 - 39%   Cataract    bil cateracts removed   Chronic kidney disease    Clotting disorder (HCC)    blood clots in legs   Diabetes mellitus without complication (HCC)    "Borderline" per pt; being monitored.  no meds per pt   Diverticulosis    Dyspnea 04/27/2014   GERD (gastroesophageal reflux disease)    Headache(784.0)    "related to my high blood pressure" (09/09/2012)   Hiatal hernia    History of DVT (deep vein thrombosis)    "RLE" (09/09/2012) coumadine cant take asa so on plavix    HTN (hypertension) 09/08/2012   Hyperlipidemia    Lupus    "cured years ago" (09/09/2012)   Other dysphagia 02/11/2013   Syncope and collapse 01/21/2018   Varicose veins    Varicose veins of leg with complications 12/05/2010   Current Outpatient Medications  Medication Sig Dispense Refill   acetaminophen (TYLENOL) 500 MG tablet Take 500 mg by mouth in the morning and at bedtime.     acetaminophen-codeine (TYLENOL #3) 300-30 MG tablet Take 1 tablet by mouth every 8 (eight) hours as needed for moderate pain. 25 tablet 0   albuterol (PROVENTIL) (2.5 MG/3ML) 0.083% nebulizer solution Take  3 mLs (2.5 mg total) by nebulization every 6 (six) hours as needed for shortness of breath or wheezing. 360 mL 6   albuterol (VENTOLIN HFA) 108 (90 Base) MCG/ACT inhaler USE 2 PUFFS EVERY 6 HOURS AS NEEDED FOR WHEEZING. 8.5 g 3   amLODipine (NORVASC) 10 MG tablet Take 1 tablet (10 mg total) by mouth daily. 30 tablet 1   atorvastatin (LIPITOR) 40 MG tablet Take 1 tablet (40 mg total) by mouth daily. 90 tablet 3   atropine 1 % ophthalmic solution Place 1 drop into both eyes 2 (two) times daily.      Azelastine-Fluticasone 137-50 MCG/ACT SUSP PLACE ONE SPRAY IN EACH NOSTRIL TWICE DAILY (IN THE MORNING AND AT BEDTIME) 23 g 6   Budeson-Glycopyrrol-Formoterol (BREZTRI AEROSPHERE) 160-9-4.8 MCG/ACT AERO INHALE 2 PUFFS INTO THE LUNGS TWICE DAILY, IN THE MORNING & AT BEDTIME. 10.7 g 4   carvedilol (COREG) 12.5 MG tablet Take 1 tablet (12.5 mg total) by mouth 2 (two) times daily with a meal. 60 tablet 1   cetirizine (ZYRTEC) 10 MG tablet Take 10 mg by mouth daily as needed for allergies or rhinitis.     citalopram (CELEXA) 20 MG tablet Take 1 tablet (20 mg total) by mouth every evening. 90 tablet 0   clopidogrel (PLAVIX) 75 MG tablet TAKE ONE TABLET BY MOUTH ONCE DAILY. 30 tablet 1   diclofenac Sodium (VOLTAREN) 1 % GEL Apply 4 g topically 2 (two) times daily as needed (painful areas). 50 g 1   doxazosin (CARDURA) 4 MG tablet Take 1 tablet (4 mg total) by mouth every 12 (twelve) hours. 60 tablet 1   ferrous sulfate 325 (65 FE) MG EC tablet Take 1 tablet (325 mg total) by mouth daily with breakfast. 30 tablet 3   fluticasone (FLONASE) 50 MCG/ACT nasal spray Place 2 sprays into both nostrils as needed for allergies or rhinitis.     folic acid (FOLVITE) 1 MG tablet Take 1 tablet (1 mg total) by mouth daily. 30 tablet 1   furosemide (LASIX) 40 MG tablet Take 2 tablets (80 mg total) by mouth every morning AND 1.5 tablets (60 mg total) every evening. 180 tablet 4   hydrALAZINE (APRESOLINE) 100 MG tablet Take 1 tablet (100 mg total) by mouth 3 (three) times daily. 90 tablet 1   LORazepam (ATIVAN) 0.5 MG tablet TAKE ONE TABLET BY MOUTH TWICE DAILY AS NEEDED FOR ANXIETY 30 tablet 0   metolazone (ZAROXOLYN) 2.5 MG tablet Take 1 tablet (2.5 mg total) by mouth once a week. Take every Monday. 15 tablet 3   montelukast (SINGULAIR) 10 MG tablet Take 1 tablet (10 mg total) by mouth daily. 90 tablet 0   pantoprazole (PROTONIX) 40 MG tablet TAKE ONE TABLET BY MOUTH TWICE DAILY 180 tablet 1   potassium chloride (KLOR-CON  M) 10 MEQ tablet Take 1 tablet (10 mEq total) by mouth daily. Take an extra 20 meq (two tablets) on Mondays (this is the day you take Metolazone) 120 tablet 3   RESTASIS 0.05 % ophthalmic emulsion Place 1 drop into both eyes 2 (two) times daily.     sodium bicarbonate 650 MG tablet Take 1 tablet (650 mg total) by mouth 2 (two) times daily. 60 tablet 1   Spacer/Aero-Holding Chambers (AEROCHAMBER PLUS) inhaler Use as instructed (Patient taking differently: Use as instructed as needed) 1 each 2   vitamin B-12 (CYANOCOBALAMIN) 1000 MCG tablet Take 1,000 mcg by mouth daily.     Vitamin D, Cholecalciferol, 25 MCG (1000  UT) TABS Take 1,000 Units by mouth daily.     No current facility-administered medications for this encounter.   Allergies  Allergen Reactions   Penicillins Nausea And Vomiting and Other (See Comments)    Has patient had a PCN reaction causing immediate rash, facial/tongue/throat swelling, SOB or lightheadedness with hypotension: Y Has patient had a PCN reaction causing severe rash involving mucus membranes or skin necrosis: Y Has patient had a PCN reaction that required hospitalization: N Has patient had a PCN reaction occurring within the last 10 years: N If all of the above answers are "NO", then may proceed with Cephalosporin use.    Aspirin Other (See Comments)    Wheezing Acetaminophen is OK    Social History   Socioeconomic History   Marital status: Single    Spouse name: Not on file   Number of children: 6   Years of education: Not on file   Highest education level: Not on file  Occupational History   Occupation: retired  Tobacco Use   Smoking status: Never   Smokeless tobacco: Never  Vaping Use   Vaping status: Never Used  Substance and Sexual Activity   Alcohol use: No   Drug use: No   Sexual activity: Not Currently    Birth control/protection: Surgical  Other Topics Concern   Not on file  Social History Narrative   2 people living in the home.  Grand  daughter.   Up and down through the night      Had 7 children  2 deceased . 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    works 3 days per week currently .   Glasses dentures  Neg tad    Social Determinants of Health   Financial Resource Strain: Low Risk  (04/14/2022)   Overall Financial Resource Strain (CARDIA)    Difficulty of Paying Living Expenses: Not very hard  Food Insecurity: No Food Insecurity (12/01/2022)   Hunger Vital Sign    Worried About Running Out of Food in the Last Year: Never true    Ran Out of Food in the Last Year: Never true  Transportation Needs: No Transportation Needs (12/01/2022)   PRAPARE - Administrator, Civil Service (Medical): No    Lack of Transportation (Non-Medical): No  Physical Activity: Not on file  Stress: Not on file  Social Connections: Not on file  Intimate Partner Violence: Not At Risk (10/31/2022)   Humiliation, Afraid, Rape, and Kick questionnaire    Fear of Current or Ex-Partner: No    Emotionally Abused: No    Physically Abused: No    Sexually Abused: No   Family History  Problem Relation Age of Onset   Hypertension Mother    Lung cancer Mother    Hypertension Father    Lung cancer Father    Breast cancer Sister    Colon cancer Sister        ? 77' s dx - died in 17's   Breast cancer Sister    Hypertension Brother    Rectal cancer Brother    Stomach cancer Brother    Breast cancer Brother    Heart attack Daughter    Esophageal cancer Daughter    Hypertension Daughter    Diabetes Daughter    Breast cancer Paternal Aunt    Lung cancer Other        both parents  Pancreatic cancer Neg Hx    BP (!) 148/60   Pulse 64   Ht 5' (1.524 m)   Wt 67.1 kg (148 lb)   SpO2 97%   BMI 28.90 kg/m   Wt Readings from Last 3 Encounters:  02/26/23 67.1 kg (148 lb)  01/29/23 69 kg (152 lb 3.2 oz)  01/16/23 65.6 kg (144 lb 9.6 oz)   PHYSICAL  EXAM: General:  NAD. No resp difficulty, elderly, walked into clinic with a cane. HEENT: Normal Neck: Supple. No JVD. Carotids 2+ bilat; no bruits. No lymphadenopathy or thryomegaly appreciated. Cor: PMI nondisplaced. Regular rate & rhythm. No rubs, gallops or murmurs. Lungs: Clear Abdomen: Soft, nontender, nondistended. No hepatosplenomegaly. No bruits or masses. Good bowel sounds. Extremities: No cyanosis, clubbing, rash, 1+ pedal edema Neuro: Alert & oriented x 3, cranial nerves grossly intact. Moves all 4 extremities w/o difficulty. Affect pleasant.  ReDs: 35%-->25% today.  DATA REVIEW  ECG: 10/31/22: NSR, borderline low voltage limb leads as per my read 12/15/22: ST elevation in V2 , (no elevation in contiguous leads) + PVCs, w/ inferior TWIs (old),   ECHO: 10/31/22: LVEF 60%, normal RV function. Moderate LVH, PASP  11/14/18: LVEF 60-65%, normal RV function; PASP .   ASSESSMENT & PLAN:  Heart failure with preserved EF - TTE w/ moderate concentric LVH; based on chart review she has history of bilateral carpal tunnel release in 2015 - Due to underlying HFpEF, CKD, carpal tunnel surgery, borderline low voltage ECG and LVH, work up for cardiac amyloidosis initiated. Myeloma panel negative, PYP pending.  - NYHA IIb-III, functional status limited by general deconditioning and what sounds like orthostasis. Volume much-improved on exam today, ReDs down to 25% today (from 35%). GDMT limited by CKD IV. Suspect pedal edema 2/2 calcium channel blocker. - With decreased appetite, I worry about volume depletion, hold off on SGLT2i for now. - Decrease Lasix to 60 mg bid, move weekly metolazone to Fridays. - Add SGLT2i when appetite improved. - No MRA/ARB/ARNi with CKD - BMET and BNP today. - Gifted a scale today.  2. Evaluation for PH - RV has normal function and morphology on TTE from 10/31/22. Elevated PASP likely secondary to chronically elevated left sided pressures.   - No  indication for invasive hemodynamics and treatment for PH therapy. PASP by TTE unchanged on TTEs dating from 2020-2024.  - At this time will focus on diuretics and management of HFpEF.  3. CKD IV - Baseline sCr 3-3.5, with a GFR persistently < 12.  - Will attempt to introduce SGLT2i next visit.   4. Hypertension  - BP control improving  - Continue amlodipine 10 mg daily.  - Continue Coreg 12.5 mg bid. HR prevents increase. - Continue hydralazine 100 mg tid. - Continue Cardura 4 mg bid. - Consider addition of Imdur next. - Renal artery duplex from 11/23/17 negative for RAS  5. Medication management - She appears confused about her medications - Arrange paramedicine for med management  Follow up in 2 months with Dr. Gasper Lloyd, as scheduled.  Prince Rome, FNP-BC 02/26/23

## 2023-02-24 MED ORDER — BUPIVACAINE HCL 0.5 % IJ SOLN
9.0000 mL | INTRAMUSCULAR | Status: AC | PRN
Start: 2023-02-21 — End: 2023-02-21
  Administered 2023-02-21: 9 mL via INTRA_ARTICULAR

## 2023-02-24 MED ORDER — LIDOCAINE HCL 1 % IJ SOLN
5.0000 mL | INTRAMUSCULAR | Status: AC | PRN
Start: 2023-02-21 — End: 2023-02-21
  Administered 2023-02-21: 5 mL

## 2023-02-24 MED ORDER — METHYLPREDNISOLONE ACETATE 40 MG/ML IJ SUSP
40.0000 mg | INTRAMUSCULAR | Status: AC | PRN
Start: 2023-02-21 — End: 2023-02-21
  Administered 2023-02-21: 40 mg via INTRA_ARTICULAR

## 2023-02-26 ENCOUNTER — Telehealth (HOSPITAL_COMMUNITY): Payer: Self-pay | Admitting: Licensed Clinical Social Worker

## 2023-02-26 ENCOUNTER — Ambulatory Visit (HOSPITAL_COMMUNITY)
Admission: RE | Admit: 2023-02-26 | Discharge: 2023-02-26 | Disposition: A | Discharge status: Home / Self Care | Payer: Medicare Other | Source: Ambulatory Visit | Attending: Family Medicine | Admitting: Family Medicine

## 2023-02-26 ENCOUNTER — Encounter (HOSPITAL_COMMUNITY): Payer: Self-pay

## 2023-02-26 VITALS — BP 148/60 | HR 64 | Ht 60.0 in | Wt 148.0 lb

## 2023-02-26 DIAGNOSIS — I5032 Chronic diastolic (congestive) heart failure: Secondary | ICD-10-CM | POA: Insufficient documentation

## 2023-02-26 DIAGNOSIS — R0602 Shortness of breath: Secondary | ICD-10-CM | POA: Diagnosis not present

## 2023-02-26 DIAGNOSIS — N184 Chronic kidney disease, stage 4 (severe): Secondary | ICD-10-CM | POA: Insufficient documentation

## 2023-02-26 DIAGNOSIS — Z79899 Other long term (current) drug therapy: Secondary | ICD-10-CM | POA: Diagnosis not present

## 2023-02-26 DIAGNOSIS — I272 Pulmonary hypertension, unspecified: Secondary | ICD-10-CM | POA: Insufficient documentation

## 2023-02-26 DIAGNOSIS — E1122 Type 2 diabetes mellitus with diabetic chronic kidney disease: Secondary | ICD-10-CM | POA: Insufficient documentation

## 2023-02-26 DIAGNOSIS — R0600 Dyspnea, unspecified: Secondary | ICD-10-CM | POA: Diagnosis not present

## 2023-02-26 DIAGNOSIS — D631 Anemia in chronic kidney disease: Secondary | ICD-10-CM | POA: Diagnosis not present

## 2023-02-26 DIAGNOSIS — I130 Hypertensive heart and chronic kidney disease with heart failure and stage 1 through stage 4 chronic kidney disease, or unspecified chronic kidney disease: Secondary | ICD-10-CM | POA: Insufficient documentation

## 2023-02-26 DIAGNOSIS — J4540 Moderate persistent asthma, uncomplicated: Secondary | ICD-10-CM | POA: Diagnosis not present

## 2023-02-26 DIAGNOSIS — M179 Osteoarthritis of knee, unspecified: Secondary | ICD-10-CM | POA: Insufficient documentation

## 2023-02-26 DIAGNOSIS — E785 Hyperlipidemia, unspecified: Secondary | ICD-10-CM | POA: Diagnosis not present

## 2023-02-26 DIAGNOSIS — I10 Essential (primary) hypertension: Secondary | ICD-10-CM | POA: Diagnosis not present

## 2023-02-26 DIAGNOSIS — I5022 Chronic systolic (congestive) heart failure: Secondary | ICD-10-CM

## 2023-02-26 LAB — BASIC METABOLIC PANEL
Anion gap: 8 (ref 5–15)
BUN: 65 mg/dL — ABNORMAL HIGH (ref 8–23)
CO2: 23 mmol/L (ref 22–32)
Calcium: 8.5 mg/dL — ABNORMAL LOW (ref 8.9–10.3)
Chloride: 108 mmol/L (ref 98–111)
Creatinine, Ser: 3.85 mg/dL — ABNORMAL HIGH (ref 0.44–1.00)
GFR, Estimated: 11 mL/min — ABNORMAL LOW (ref >60)
Glucose, Bld: 109 mg/dL — ABNORMAL HIGH (ref 70–99)
Potassium: 4.2 mmol/L (ref 3.5–5.1)
Sodium: 139 mmol/L (ref 135–145)

## 2023-02-26 LAB — BRAIN NATRIURETIC PEPTIDE: B Natriuretic Peptide: 255.7 pg/mL — ABNORMAL HIGH (ref 0.0–100.0)

## 2023-02-26 MED ORDER — METOLAZONE 2.5 MG PO TABS
2.5000 mg | ORAL_TABLET | ORAL | 3 refills | Status: DC
Start: 2023-02-26 — End: 2023-08-13

## 2023-02-26 MED ORDER — FUROSEMIDE 40 MG PO TABS
60.0000 mg | ORAL_TABLET | Freq: Two times a day (BID) | ORAL | 3 refills | Status: DC
Start: 2023-02-26 — End: 2023-06-19

## 2023-02-26 NOTE — Patient Instructions (Signed)
Decrease Lasix to 60 mg twice daily (1 and 1/2 tablet twice daily). Take metolazone once weekly on FRIDAY. Will ask our Social Worker to reach out to you to set up Paramedicine to help you.  Please keep scheduled follow up with Dr. Gasper Lloyd - see below. Please call us at [phone] if any questions or concerns  prior to your next visit.

## 2023-02-26 NOTE — Progress Notes (Signed)
ReDS Vest / Clip - 02/26/23 1400       ReDS Vest / Clip   Station Marker A    Ruler Value 26    ReDS Value Range Low volume    ReDS Actual Value 25

## 2023-02-26 NOTE — Telephone Encounter (Signed)
H&V Care Navigation CSW Progress Note  Clinical Social Worker consulted to enroll pt in paramedicine program- referral sent to paramedics for review and follow up   SDOH Screenings   Food Insecurity: No Food Insecurity (12/01/2022)  Housing: Low Risk  (12/01/2022)  Transportation Needs: No Transportation Needs (12/01/2022)  Utilities: Not At Risk (10/31/2022)  Depression (PHQ2-9): Low Risk  (01/16/2023)  Financial Resource Strain: Low Risk  (04/14/2022)  Tobacco Use: Low Risk  (02/26/2023)   Burna Sis, LCSW Clinical Social Worker [facility] Desk#: [phone] Cell#: [phone]

## 2023-02-27 ENCOUNTER — Inpatient Hospital Stay: Payer: Medicare Other

## 2023-02-27 ENCOUNTER — Other Ambulatory Visit: Payer: Self-pay

## 2023-02-27 ENCOUNTER — Inpatient Hospital Stay: Payer: Medicare Other | Attending: Hematology

## 2023-02-27 DIAGNOSIS — D638 Anemia in other chronic diseases classified elsewhere: Secondary | ICD-10-CM

## 2023-02-27 DIAGNOSIS — Z79899 Other long term (current) drug therapy: Secondary | ICD-10-CM | POA: Diagnosis not present

## 2023-02-27 DIAGNOSIS — D631 Anemia in chronic kidney disease: Secondary | ICD-10-CM | POA: Insufficient documentation

## 2023-02-27 DIAGNOSIS — N183 Chronic kidney disease, stage 3 unspecified: Secondary | ICD-10-CM | POA: Insufficient documentation

## 2023-02-27 LAB — CBC WITH DIFFERENTIAL (CANCER CENTER ONLY)
Abs Immature Granulocytes: 0.03 10*3/uL (ref 0.00–0.07)
Basophils Absolute: 0 10*3/uL (ref 0.0–0.1)
Basophils Relative: 1 %
Eosinophils Absolute: 0.1 10*3/uL (ref 0.0–0.5)
Eosinophils Relative: 3 %
HCT: 37.1 % (ref 36.0–46.0)
Hemoglobin: 11.5 g/dL — ABNORMAL LOW (ref 12.0–15.0)
Immature Granulocytes: 1 %
Lymphocytes Relative: 39 %
Lymphs Abs: 1.4 10*3/uL (ref 0.7–4.0)
MCH: 29.3 pg (ref 26.0–34.0)
MCHC: 31 g/dL (ref 30.0–36.0)
MCV: 94.4 fL (ref 80.0–100.0)
Monocytes Absolute: 0.3 10*3/uL (ref 0.1–1.0)
Monocytes Relative: 8 %
Neutro Abs: 1.7 10*3/uL (ref 1.7–7.7)
Neutrophils Relative %: 48 %
Platelet Count: 156 10*3/uL (ref 150–400)
RBC: 3.93 MIL/uL (ref 3.87–5.11)
RDW: 15.2 % (ref 11.5–15.5)
WBC Count: 3.5 10*3/uL — ABNORMAL LOW (ref 4.0–10.5)
nRBC: 0 % (ref 0.0–0.2)

## 2023-02-27 NOTE — Progress Notes (Signed)
Patient here for Aranesp, Hgb 11.5 so she does not meet requirements for injection. Labs printed off and given to patient.

## 2023-03-01 ENCOUNTER — Other Ambulatory Visit (HOSPITAL_COMMUNITY): Payer: Self-pay

## 2023-03-01 ENCOUNTER — Other Ambulatory Visit: Payer: Self-pay

## 2023-03-01 ENCOUNTER — Ambulatory Visit: Payer: Medicare Other | Admitting: Physical Medicine and Rehabilitation

## 2023-03-01 DIAGNOSIS — M5416 Radiculopathy, lumbar region: Secondary | ICD-10-CM | POA: Diagnosis not present

## 2023-03-01 MED ORDER — METHYLPREDNISOLONE ACETATE 40 MG/ML IJ SUSP
40.0000 mg | Freq: Once | INTRAMUSCULAR | Status: AC
Start: 2023-03-01 — End: 2023-03-01
  Administered 2023-03-01: 40 mg

## 2023-03-01 NOTE — Patient Instructions (Signed)

## 2023-03-01 NOTE — Progress Notes (Signed)
Paramedicine Encounter    Patient ID: [name], female    DOB: 08-Feb-1934, 89 y.o.   MRN: [id number]   Complaints-back pain   Edema-yes  Compliance with meds-yes but wrong   Pill box filled-yes  If so, by whom-paramedic   Refills needed-see below     SOCIAL/MEDICAL BARRIERS:  PHARMACY USED -[facility]    MED ISSUES:  AFFORDABILITY-for now   PT ASSIST APPS NEEDED-can look into the bretztri inhaler -she said it was expensive   PCP-panosh    INSURANCE-yes   SOURCE OF INCOME-retirement check-900?  TRANSPORTATION-daughter or grand-daughter   FOOD INSECURITIES/NEEDS-none  FOOD STAMPS-none-never tried--is she eligible for medicaid   REVIEWED DIET/FLUID/SALT RESTRICTIONS-CHF the past couple years  RENT/OWN HOME ISSUES-no   SOCIAL SUPPORT-yes lives with granddaughter, daughter close by and visits often  SAFETY/DOMESTIC ISSUES-none   SUBSTANCE ABUSE-none  DAILY WEIGHTS-  EDUCATE ON DISEASE PROCESS/SYMPTOMS/PURPOSES OF MEDS-yes  First visit with the pt in the home.  She lives with her grand-daughter who works.  She reports the edema to her legs are getting better. She went to doc today to get shot in her back.  B/p elevated but she has not been taking meds like her list shows.  Will get this corrected.   Meds reviewed-- She has not been taking meds accurately but not by her fault-the directions are old vs what she is taking now.  She has been following directions on the bottles and there have been changes.   carvedilol needs new rx--(she has rx of 1tab daily of 6.25) Doxazosin--needs new rx  Folic acid Needs sod bicarb  These need to be pulled from toc.I called [facility] to get these pulled over.   She did agree for me to fill pill box for her so she now has all the correct dosages in pill box for her meds.   See back on Monday    BP (!) 180/60   Pulse (!) 58   Resp 16   SpO2 98%  Weight yesterday-144 Last visit weight--148 @ clinic    Patient Care Team: Panosh, Neta Mends, MD as PCP - General (Internal Medicine) Nahser, Deloris Ping, MD as PCP - Cardiology (Cardiology) August Saucer, Corrie Mckusick, MD (Orthopedic Surgery) Malachy Mood, MD as Consulting Physician (Hematology) Bufford Buttner, MD as Consulting Physician (Nephrology) Mateo Flow, MD as Consulting Physician (Ophthalmology) Eber Jones, MD as Referring Physician (Ophthalmology) Delton Coombes Les Pou, MD as Consulting Physician (Pulmonary Disease) Sherrill Raring, [facility] (Pharmacist)  Patient Active Problem List   Diagnosis Date Noted   CAP (community acquired pneumonia) 10/31/2022   Diastolic congestive heart failure (HCC) 10/31/2022   Hypertensive urgency 10/31/2022   Moderate persistent asthma 10/31/2022   Hypomagnesemia 10/31/2022   CKD (chronic kidney disease), stage IV (HCC) 10/31/2022   Pulmonary hypertension, unspecified (HCC) 12/06/2021   Chronic cough 10/18/2021   Chronic rhinitis 11/24/2019   Nausea 11/03/2018   Elevated troponin 11/03/2018   Hyponatremia 11/03/2018   Hypertensive heart disease with hypertensive chronic kidney disease 11/30/2017   Fasting hyperglycemia 11/30/2017   Renal cyst 03/27/2016   CKD (chronic kidney disease) stage 3, GFR 30-59 ml/min (HCC) 08/23/2015   Subjective hearing change 06/29/2014   Resistant hypertension 06/29/2014   Lumbago 06/15/2014   Pre-diabetes vs early DM  06/15/2014   Dyspnea 04/27/2014   Asthma, chronic 04/13/2014   Elevated uric acid in blood 04/13/2014   Essential hypertension 03/27/2014   History of DVT (deep vein thrombosis)    Palpitations 01/05/2014  Anemia of chronic disease 12/19/2013   Death of family member 2013-09-10   Leg edema 06/20/2013   Bereavement due to life event 04/21/2013   Other dysphagia 02/11/2013   Colon cancer screening 02/11/2013   Anxiety state 01/20/2013   Leg cramps 01/20/2013   Back pain 12/05/2012   Family hx of colon cancer 10/21/2012   Family hx of lung cancer  10/21/2012   Family hx-breast malignancy 10/21/2012   Renal insufficiency creatinine 1.3 10/21/2012   Anemia, chronic disease 10/21/2012   GERD (gastroesophageal reflux disease) 09/08/2012   HTN (hypertension) 09/08/2012   Hyperlipidemia 09/08/2012   Varicose veins of leg with complications 12/05/2010    Current Outpatient Medications:    acetaminophen (TYLENOL) 500 MG tablet, Take 500 mg by mouth in the morning and at bedtime., Disp: , Rfl:    acetaminophen-codeine (TYLENOL #3) 300-30 MG tablet, Take 1 tablet by mouth every 8 (eight) hours as needed for moderate pain., Disp: 25 tablet, Rfl: 0   albuterol (PROVENTIL) (2.5 MG/3ML) 0.083% nebulizer solution, Take 3 mLs (2.5 mg total) by nebulization every 6 (six) hours as needed for shortness of breath or wheezing., Disp: 360 mL, Rfl: 6   albuterol (VENTOLIN HFA) 108 (90 Base) MCG/ACT inhaler, USE 2 PUFFS EVERY 6 HOURS AS NEEDED FOR WHEEZING., Disp: 8.5 g, Rfl: 3   amLODipine (NORVASC) 10 MG tablet, Take 1 tablet (10 mg total) by mouth daily. (Patient taking differently: Take 10 mg by mouth at bedtime.), Disp: 30 tablet, Rfl: 1   atorvastatin (LIPITOR) 40 MG tablet, Take 1 tablet (40 mg total) by mouth daily., Disp: 90 tablet, Rfl: 3   atropine 1 % ophthalmic solution, Place 1 drop into both eyes 2 (two) times daily., Disp: , Rfl:    Azelastine-Fluticasone 137-50 MCG/ACT SUSP, PLACE ONE SPRAY IN EACH NOSTRIL TWICE DAILY (IN THE MORNING AND AT BEDTIME), Disp: 23 g, Rfl: 6   Budeson-Glycopyrrol-Formoterol (BREZTRI AEROSPHERE) 160-9-4.8 MCG/ACT AERO, INHALE 2 PUFFS INTO THE LUNGS TWICE DAILY, IN THE MORNING & AT BEDTIME., Disp: 10.7 g, Rfl: 4   carvedilol (COREG) 12.5 MG tablet, Take 1 tablet (12.5 mg total) by mouth 2 (two) times daily with a meal., Disp: 60 tablet, Rfl: 1   cetirizine (ZYRTEC) 10 MG tablet, Take 10 mg by mouth daily as needed for allergies or rhinitis., Disp: , Rfl:    citalopram (CELEXA) 20 MG tablet, Take 1 tablet (20 mg total)  by mouth every evening., Disp: 90 tablet, Rfl: 0   clopidogrel (PLAVIX) 75 MG tablet, TAKE ONE TABLET BY MOUTH ONCE DAILY., Disp: 30 tablet, Rfl: 1   diclofenac Sodium (VOLTAREN) 1 % GEL, Apply 4 g topically 2 (two) times daily as needed (painful areas)., Disp: 50 g, Rfl: 1   doxazosin (CARDURA) 4 MG tablet, Take 1 tablet (4 mg total) by mouth every 12 (twelve) hours., Disp: 60 tablet, Rfl: 1   ferrous sulfate 325 (65 FE) MG EC tablet, Take 1 tablet (325 mg total) by mouth daily with breakfast., Disp: 30 tablet, Rfl: 3   fluticasone (FLONASE) 50 MCG/ACT nasal spray, Place 2 sprays into both nostrils as needed for allergies or rhinitis., Disp: , Rfl:    furosemide (LASIX) 40 MG tablet, Take 1.5 tablets (60 mg total) by mouth 2 (two) times daily., Disp: 270 tablet, Rfl: 3   hydrALAZINE (APRESOLINE) 100 MG tablet, Take 1 tablet (100 mg total) by mouth 3 (three) times daily., Disp: 90 tablet, Rfl: 1   LORazepam (ATIVAN) 0.5 MG tablet, TAKE ONE  TABLET BY MOUTH TWICE DAILY AS NEEDED FOR ANXIETY, Disp: 30 tablet, Rfl: 0   metolazone (ZAROXOLYN) 2.5 MG tablet, Take 1 tablet (2.5 mg total) by mouth once a week. Take every Friday, Disp: 15 tablet, Rfl: 3   montelukast (SINGULAIR) 10 MG tablet, Take 1 tablet (10 mg total) by mouth daily., Disp: 90 tablet, Rfl: 0   pantoprazole (PROTONIX) 40 MG tablet, TAKE ONE TABLET BY MOUTH TWICE DAILY, Disp: 180 tablet, Rfl: 1   potassium chloride (KLOR-CON M) 10 MEQ tablet, Take 1 tablet (10 mEq total) by mouth daily. Take an extra 20 meq (two tablets) on Mondays (this is the day you take Metolazone), Disp: 120 tablet, Rfl: 3   RESTASIS 0.05 % ophthalmic emulsion, Place 1 drop into both eyes 2 (two) times daily., Disp: , Rfl:    vitamin B-12 (CYANOCOBALAMIN) 1000 MCG tablet, Take 1,000 mcg by mouth daily., Disp: , Rfl:    Vitamin D, Cholecalciferol, 25 MCG (1000 UT) TABS, Take 1,000 Units by mouth daily., Disp: , Rfl:    folic acid (FOLVITE) 1 MG tablet, Take 1 tablet (1 mg  total) by mouth daily., Disp: 30 tablet, Rfl: 1   sodium bicarbonate 650 MG tablet, Take 1 tablet (650 mg total) by mouth 2 (two) times daily., Disp: 60 tablet, Rfl: 1   Spacer/Aero-Holding Chambers (AEROCHAMBER PLUS) inhaler, Use as instructed (Patient taking differently: Use as instructed as needed), Disp: 1 each, Rfl: 2  Current Facility-Administered Medications:    methylPREDNISolone acetate (DEPO-MEDROL) injection 40 mg, 40 mg, Other, Once,  Allergies  Allergen Reactions   Penicillins Nausea And Vomiting and Other (See Comments)    Has patient had a PCN reaction causing immediate rash, facial/tongue/throat swelling, SOB or lightheadedness with hypotension: Y Has patient had a PCN reaction causing severe rash involving mucus membranes or skin necrosis: Y Has patient had a PCN reaction that required hospitalization: N Has patient had a PCN reaction occurring within the last 10 years: N If all of the above answers are "NO", then may proceed with Cephalosporin use.    Aspirin Other (See Comments)    Wheezing Acetaminophen is OK       Social History   Socioeconomic History   Marital status: Single    Spouse name: Not on file   Number of children: 6   Years of education: Not on file   Highest education level: Not on file  Occupational History   Occupation: retired  Tobacco Use   Smoking status: Never   Smokeless tobacco: Never  Vaping Use   Vaping status: Never Used  Substance and Sexual Activity   Alcohol use: No   Drug use: No   Sexual activity: Not Currently    Birth control/protection: Surgical  Other Topics Concern   Not on file  Social History Narrative   2 people living in the home.  Grand daughter.   Up and down through the night      Had 7 children  2 deceased . 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    works 3 days per week currently .   Glasses dentures  Neg tad    Social  Determinants of Health   Financial Resource Strain: Low Risk  (04/14/2022)   Overall Financial Resource Strain (CARDIA)    Difficulty of Paying Living Expenses: Not very hard  Food Insecurity: No Food Insecurity (12/01/2022)   Hunger  Vital Sign    Worried About Programme researcher, broadcasting/film/video in the Last Year: Never true    Ran Out of Food in the Last Year: Never true  Transportation Needs: No Transportation Needs (12/01/2022)   PRAPARE - Administrator, Civil Service (Medical): No    Lack of Transportation (Non-Medical): No  Physical Activity: Not on file  Stress: Not on file  Social Connections: Not on file  Intimate Partner Violence: Not At Risk (10/31/2022)   Humiliation, Afraid, Rape, and Kick questionnaire    Fear of Current or Ex-Partner: No    Emotionally Abused: No    Physically Abused: No    Sexually Abused: No    Physical Exam      Future Appointments  Date Time Provider Department Center  03/27/2023 12:30 PM [facility] None  03/27/2023  1:00 PM Carlean Jews, NP [facility] None  03/27/2023  1:45 PM [facility] FLUSH [facility] None  04/17/2023 11:30 AM Panosh, Neta Mends, MD [facility]  04/19/2023  1:45 PM Leslye Peer, MD [facility] None  04/24/2023 11:40 AM Dorthula Nettles, DO [facility] None       Kerry Hough, Paramedic [phone] [facility] Paramedic  03/01/23

## 2023-03-01 NOTE — Progress Notes (Signed)
Functional Pain Scale - descriptive words and definitions  Intense (8)    Cannot complete any ADLs without much assistance/cannot concentrate/conversation is difficult/unable to sleep and unable to use distraction. Severe range order  Average Pain 8 187/70 She is not taking her  blood thinners.  +Driver, -BT, -Dye Allergies.

## 2023-03-03 ENCOUNTER — Other Ambulatory Visit: Payer: Self-pay | Admitting: Emergency Medicine

## 2023-03-05 ENCOUNTER — Other Ambulatory Visit (HOSPITAL_COMMUNITY): Payer: Self-pay

## 2023-03-05 ENCOUNTER — Telehealth (HOSPITAL_COMMUNITY): Payer: Self-pay | Admitting: Licensed Clinical Social Worker

## 2023-03-05 NOTE — Progress Notes (Signed)
Paramedicine Encounter    Patient ID: [name], female    DOB: 09-Sep-1933, 89 y.o.   MRN: [id number]   Complaints-none   Edema-yes  Compliance with meds-yes  Pill box filled-yes  If so, by whom-paramedic   Refills needed-pantoprazole-she called in but needs refills.     Pt reports she is doing ok. She denies increased sob, she does have a little wheeze today. But she said with the weather change that she does get wheezy more.  She reports dizziness at times but not since I seen her last week.  She did good with her pill box.  She did take her meds this morning just a few min ago so it has not had time to work yet. I will see her next week a couple hrs after she takes meds to see what her b/p runs post meds.  Upon filling pill box again, she has random pills in other pill bottles. Her daughter says she has her mind 100% and knows what pills are what even in different bottles. When asked about this she is able to provide the name of which pill is what. But advised her it was important to keep pills in the correct bottles.   She still has pedal edema to both--per last clinic notes its 2/2 to calcium ch blocker. She is wearing stockings but they just look like thicker pair of panty hose-advised her to wear compression stockings.  Daughter reports she does have some and will make sure she will wear them.   Will see her again on Tuesday around 1230.    BP (!) 164/60   Pulse 60   Resp 16   Wt 147 lb (66.7 kg)   SpO2 99%   BMI 28.71 kg/m  Weight yesterday-? Last visit weight-144  Patient Care Team: [name], Angela Mends, MD as PCP - General (Internal Medicine) [name], Angela Ping, MD as PCP - Cardiology (Cardiology) [name], Angela Mckusick, MD (Orthopedic Surgery) Angela Mood, MD as Consulting Physician (Hematology) Angela Buttner, MD as Consulting Physician (Nephrology) Angela Flow, MD as Consulting Physician (Ophthalmology) Angela Jones, MD as Referring Physician  (Ophthalmology) Angela Coombes Les Pou, MD as Consulting Physician (Pulmonary Disease) [name], [name] (Pharmacist)  Patient Active Problem List   Diagnosis Date Noted   CAP (community acquired pneumonia) 10/31/2022   Diastolic congestive heart failure (HCC) 10/31/2022   Hypertensive urgency 10/31/2022   Moderate persistent asthma 10/31/2022   Hypomagnesemia 10/31/2022   CKD (chronic kidney disease), stage IV (HCC) 10/31/2022   Pulmonary hypertension, unspecified (HCC) 12/06/2021   Chronic cough 10/18/2021   Chronic rhinitis 11/24/2019   Nausea 11/03/2018   Elevated troponin 11/03/2018   Hyponatremia 11/03/2018   Hypertensive heart disease with hypertensive chronic kidney disease 11/30/2017   Fasting hyperglycemia 11/30/2017   Renal cyst 03/27/2016   CKD (chronic kidney disease) stage 3, GFR 30-59 ml/min (HCC) 08/23/2015   Subjective hearing change 06/29/2014   Resistant hypertension 06/29/2014   Lumbago 06/15/2014   Pre-diabetes vs early DM  06/15/2014   Dyspnea 04/27/2014   Asthma, chronic 04/13/2014   Elevated uric acid in blood 04/13/2014   Essential hypertension 03/27/2014   History of DVT (deep vein thrombosis)    Palpitations 01/05/2014   Anemia of chronic disease Jan 15, 2014   Death of family member 09/19/13   Leg edema 06/20/2013   Bereavement due to life event 04/21/2013   Other dysphagia 02/11/2013   Colon cancer screening 02/11/2013   Anxiety state 01/20/2013   Leg cramps  01/20/2013   Back pain 12/05/2012   Family hx of colon cancer 10/21/2012   Family hx of lung cancer 10/21/2012   Family hx-breast malignancy 10/21/2012   Renal insufficiency creatinine 1.3 10/21/2012   Anemia, chronic disease 10/21/2012   GERD (gastroesophageal reflux disease) 09/08/2012   HTN (hypertension) 09/08/2012   Hyperlipidemia 09/08/2012   Varicose veins of leg with complications 12/05/2010    Current Outpatient Medications:    acetaminophen (TYLENOL) 500 MG tablet, Take 500  mg by mouth in the morning and at bedtime., Disp: , Rfl:    acetaminophen-codeine (TYLENOL #3) 300-30 MG tablet, Take 1 tablet by mouth every 8 (eight) hours as needed for moderate pain., Disp: 25 tablet, Rfl: 0   albuterol (PROVENTIL) (2.5 MG/3ML) 0.083% nebulizer solution, Take 3 mLs (2.5 mg total) by nebulization every 6 (six) hours as needed for shortness of breath or wheezing., Disp: 360 mL, Rfl: 6   albuterol (VENTOLIN HFA) 108 (90 Base) MCG/ACT inhaler, USE 2 PUFFS EVERY 6 HOURS AS NEEDED FOR WHEEZING., Disp: 8.5 g, Rfl: 3   amLODipine (NORVASC) 10 MG tablet, Take 1 tablet (10 mg total) by mouth daily. (Patient taking differently: Take 10 mg by mouth at bedtime.), Disp: 30 tablet, Rfl: 1   atorvastatin (LIPITOR) 40 MG tablet, Take 1 tablet (40 mg total) by mouth daily., Disp: 90 tablet, Rfl: 3   atropine 1 % ophthalmic solution, Place 1 drop into both eyes 2 (two) times daily., Disp: , Rfl:    Azelastine-Fluticasone 137-50 MCG/ACT SUSP, PLACE ONE SPRAY IN EACH NOSTRIL TWICE DAILY (IN THE MORNING AND AT BEDTIME), Disp: 23 g, Rfl: 6   Budeson-Glycopyrrol-Formoterol (BREZTRI AEROSPHERE) 160-9-4.8 MCG/ACT AERO, INHALE 2 PUFFS INTO THE LUNGS TWICE DAILY, IN THE MORNING & AT BEDTIME., Disp: 10.7 g, Rfl: 2   carvedilol (COREG) 12.5 MG tablet, Take 1 tablet (12.5 mg total) by mouth 2 (two) times daily with a meal., Disp: 60 tablet, Rfl: 1   cetirizine (ZYRTEC) 10 MG tablet, Take 10 mg by mouth daily as needed for allergies or rhinitis., Disp: , Rfl:    citalopram (CELEXA) 20 MG tablet, Take 1 tablet (20 mg total) by mouth every evening., Disp: 90 tablet, Rfl: 0   clopidogrel (PLAVIX) 75 MG tablet, TAKE ONE TABLET BY MOUTH ONCE DAILY., Disp: 30 tablet, Rfl: 1   diclofenac Sodium (VOLTAREN) 1 % GEL, Apply 4 g topically 2 (two) times daily as needed (painful areas)., Disp: 50 g, Rfl: 1   doxazosin (CARDURA) 4 MG tablet, Take 1 tablet (4 mg total) by mouth every 12 (twelve) hours., Disp: 60 tablet, Rfl: 1    ferrous sulfate 325 (65 FE) MG EC tablet, Take 1 tablet (325 mg total) by mouth daily with breakfast., Disp: 30 tablet, Rfl: 3   fluticasone (FLONASE) 50 MCG/ACT nasal spray, Place 2 sprays into both nostrils as needed for allergies or rhinitis., Disp: , Rfl:    folic acid (FOLVITE) 1 MG tablet, Take 1 tablet (1 mg total) by mouth daily., Disp: 30 tablet, Rfl: 1   furosemide (LASIX) 40 MG tablet, Take 1.5 tablets (60 mg total) by mouth 2 (two) times daily., Disp: 270 tablet, Rfl: 3   hydrALAZINE (APRESOLINE) 100 MG tablet, Take 1 tablet (100 mg total) by mouth 3 (three) times daily., Disp: 90 tablet, Rfl: 1   metolazone (ZAROXOLYN) 2.5 MG tablet, Take 1 tablet (2.5 mg total) by mouth once a week. Take every Friday, Disp: 15 tablet, Rfl: 3   montelukast (SINGULAIR) 10 MG  tablet, Take 1 tablet (10 mg total) by mouth daily. (Patient taking differently: Take 10 mg by mouth at bedtime.), Disp: 90 tablet, Rfl: 0   pantoprazole (PROTONIX) 40 MG tablet, TAKE ONE TABLET BY MOUTH TWICE DAILY, Disp: 180 tablet, Rfl: 1   potassium chloride (KLOR-CON M) 10 MEQ tablet, Take 1 tablet (10 mEq total) by mouth daily. Take an extra 20 meq (two tablets) on Mondays (this is the day you take Metolazone), Disp: 120 tablet, Rfl: 3   sodium bicarbonate 650 MG tablet, Take 1 tablet (650 mg total) by mouth 2 (two) times daily., Disp: 60 tablet, Rfl: 1   Spacer/Aero-Holding Chambers (AEROCHAMBER PLUS) inhaler, Use as instructed (Patient taking differently: Use as instructed as needed), Disp: 1 each, Rfl: 2   vitamin B-12 (CYANOCOBALAMIN) 1000 MCG tablet, Take 1,000 mcg by mouth daily., Disp: , Rfl:    Vitamin D, Cholecalciferol, 25 MCG (1000 UT) TABS, Take 1,000 Units by mouth daily., Disp: , Rfl:    LORazepam (ATIVAN) 0.5 MG tablet, TAKE ONE TABLET BY MOUTH TWICE DAILY AS NEEDED FOR ANXIETY, Disp: 30 tablet, Rfl: 0   RESTASIS 0.05 % ophthalmic emulsion, Place 1 drop into both eyes 2 (two) times daily., Disp: , Rfl:   Current  Facility-Administered Medications:    methylPREDNISolone acetate (DEPO-MEDROL) injection 40 mg, 40 mg, Other, Once,  Allergies  Allergen Reactions   Penicillins Nausea And Vomiting and Other (See Comments)    Has patient had a PCN reaction causing immediate rash, facial/tongue/throat swelling, SOB or lightheadedness with hypotension: Y Has patient had a PCN reaction causing severe rash involving mucus membranes or skin necrosis: Y Has patient had a PCN reaction that required hospitalization: N Has patient had a PCN reaction occurring within the last 10 years: N If all of the above answers are "NO", then may proceed with Cephalosporin use.    Aspirin Other (See Comments)    Wheezing Acetaminophen is OK       Social History   Socioeconomic History   Marital status: Single    Spouse name: Not on file   Number of children: 6   Years of education: Not on file   Highest education level: Not on file  Occupational History   Occupation: retired  Tobacco Use   Smoking status: Never   Smokeless tobacco: Never  Vaping Use   Vaping status: Never Used  Substance and Sexual Activity   Alcohol use: No   Drug use: No   Sexual activity: Not Currently    Birth control/protection: Surgical  Other Topics Concern   Not on file  Social History Narrative   2 people living in the home.  Grand daughter.   Up and down through the night      Had 7 children  2 deceased . 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    works 3 days per week currently .   Glasses dentures  Neg tad    Social Determinants of Health   Financial Resource Strain: Low Risk  (04/14/2022)   Overall Financial Resource Strain (CARDIA)    Difficulty of Paying Living Expenses: Not very hard  Food Insecurity: No Food Insecurity (12/01/2022)   Hunger Vital Sign    Worried About Running Out of Food in the Last Year: Never true    Ran Out of Food in the  Last Year: Never true  Transportation Needs: No Transportation  Needs (12/01/2022)   PRAPARE - Administrator, Civil Service (Medical): No    Lack of Transportation (Non-Medical): No  Physical Activity: Not on file  Stress: Not on file  Social Connections: Not on file  Intimate Partner Violence: Not At Risk (10/31/2022)   Humiliation, Afraid, Rape, and Kick questionnaire    Fear of Current or Ex-Partner: No    Emotionally Abused: No    Physically Abused: No    Sexually Abused: No    Physical Exam      Future Appointments  Date Time Provider Department Center  03/27/2023 12:30 PM [facility] None  03/27/2023  1:00 PM Carlean Jews, NP [facility] None  03/27/2023  1:45 PM [facility] FLUSH [facility] None  04/17/2023 11:30 AM [name], Angela Mends, MD [facility]  04/19/2023  1:45 PM Leslye Peer, MD [facility] None  04/24/2023 11:40 AM Dorthula Nettles, DO [facility] None       Kerry Hough, Paramedic [phone] [place] [facility] Paramedic  03/05/23

## 2023-03-05 NOTE — Telephone Encounter (Signed)
CSW informed by [employer] that seems to be eligible for Medicaid given financial status- CSW reached to pt to assess further- unable to reach- left VM requesting return call  Burna Sis, LCSW Clinical Social Worker [facility] Desk#: [phone] Cell#: [phone]

## 2023-03-07 ENCOUNTER — Other Ambulatory Visit: Payer: Self-pay | Admitting: Internal Medicine

## 2023-03-10 ENCOUNTER — Other Ambulatory Visit: Payer: Self-pay | Admitting: Emergency Medicine

## 2023-03-13 ENCOUNTER — Telehealth (HOSPITAL_COMMUNITY): Payer: Self-pay

## 2023-03-13 ENCOUNTER — Emergency Department (HOSPITAL_COMMUNITY)
Admission: EM | Admit: 2023-03-13 | Discharge: 2023-03-13 | Disposition: A | Discharge status: Home / Self Care | Payer: Medicare Other | Attending: Emergency Medicine | Admitting: Emergency Medicine

## 2023-03-13 ENCOUNTER — Other Ambulatory Visit: Payer: Self-pay

## 2023-03-13 ENCOUNTER — Encounter (HOSPITAL_COMMUNITY): Payer: Self-pay

## 2023-03-13 ENCOUNTER — Emergency Department (HOSPITAL_COMMUNITY): Payer: Medicare Other

## 2023-03-13 DIAGNOSIS — R112 Nausea with vomiting, unspecified: Secondary | ICD-10-CM | POA: Diagnosis not present

## 2023-03-13 DIAGNOSIS — R1084 Generalized abdominal pain: Secondary | ICD-10-CM | POA: Diagnosis not present

## 2023-03-13 DIAGNOSIS — R630 Anorexia: Secondary | ICD-10-CM | POA: Diagnosis not present

## 2023-03-13 DIAGNOSIS — I10 Essential (primary) hypertension: Secondary | ICD-10-CM | POA: Insufficient documentation

## 2023-03-13 DIAGNOSIS — Z79899 Other long term (current) drug therapy: Secondary | ICD-10-CM | POA: Diagnosis not present

## 2023-03-13 DIAGNOSIS — K4090 Unilateral inguinal hernia, without obstruction or gangrene, not specified as recurrent: Secondary | ICD-10-CM | POA: Diagnosis not present

## 2023-03-13 DIAGNOSIS — R001 Bradycardia, unspecified: Secondary | ICD-10-CM | POA: Diagnosis not present

## 2023-03-13 DIAGNOSIS — R109 Unspecified abdominal pain: Secondary | ICD-10-CM | POA: Diagnosis not present

## 2023-03-13 DIAGNOSIS — K449 Diaphragmatic hernia without obstruction or gangrene: Secondary | ICD-10-CM | POA: Diagnosis not present

## 2023-03-13 LAB — CBC
HCT: 40.3 % (ref 36.0–46.0)
Hemoglobin: 12.4 g/dL (ref 12.0–15.0)
MCH: 28.4 pg (ref 26.0–34.0)
MCHC: 30.8 g/dL (ref 30.0–36.0)
MCV: 92.4 fL (ref 80.0–100.0)
Platelets: 187 10*3/uL (ref 150–400)
RBC: 4.36 MIL/uL (ref 3.87–5.11)
RDW: 14.6 % (ref 11.5–15.5)
WBC: 3.9 10*3/uL — ABNORMAL LOW (ref 4.0–10.5)
nRBC: 0 % (ref 0.0–0.2)

## 2023-03-13 LAB — COMPREHENSIVE METABOLIC PANEL
ALT: 12 U/L (ref 0–44)
AST: 22 U/L (ref 15–41)
Albumin: 3.3 g/dL — ABNORMAL LOW (ref 3.5–5.0)
Alkaline Phosphatase: 55 U/L (ref 38–126)
Anion gap: 12 (ref 5–15)
BUN: 66 mg/dL — ABNORMAL HIGH (ref 8–23)
CO2: 24 mmol/L (ref 22–32)
Calcium: 8.6 mg/dL — ABNORMAL LOW (ref 8.9–10.3)
Chloride: 103 mmol/L (ref 98–111)
Creatinine, Ser: 3.87 mg/dL — ABNORMAL HIGH (ref 0.44–1.00)
GFR, Estimated: 11 mL/min — ABNORMAL LOW (ref >60)
Glucose, Bld: 125 mg/dL — ABNORMAL HIGH (ref 70–99)
Potassium: 3.6 mmol/L (ref 3.5–5.1)
Sodium: 139 mmol/L (ref 135–145)
Total Bilirubin: 0.6 mg/dL (ref 0.3–1.2)
Total Protein: 6.7 g/dL (ref 6.5–8.1)

## 2023-03-13 LAB — URINALYSIS, ROUTINE W REFLEX MICROSCOPIC
Bilirubin Urine: NEGATIVE
Glucose, UA: 50 mg/dL — AB
Ketones, ur: NEGATIVE mg/dL
Leukocytes,Ua: NEGATIVE
Nitrite: NEGATIVE
Protein, ur: 100 mg/dL — AB
Specific Gravity, Urine: 1.013 (ref 1.005–1.030)
pH: 5 (ref 5.0–8.0)

## 2023-03-13 LAB — LIPASE, BLOOD: Lipase: 62 U/L — ABNORMAL HIGH (ref 11–51)

## 2023-03-13 MED ORDER — ONDANSETRON 4 MG PO TBDP: ORAL_TABLET | ORAL | 0 refills | Status: DC

## 2023-03-13 MED ORDER — ONDANSETRON 4 MG PO TBDP
4.0000 mg | ORAL_TABLET | Freq: Once | ORAL | Status: AC | PRN
  Administered 2023-03-13: 4 mg via ORAL
  Filled 2023-03-13: qty 1

## 2023-03-13 NOTE — ED Provider Notes (Signed)
[place] EMERGENCY DEPARTMENT AT [facility] Provider Note   CSN: [id number] Arrival date & time: 03/13/23  1202     History {Add pertinent medical, surgical, social history, OB history to HPI:1} Chief Complaint  Patient presents with   Abdominal Pain    [name] is a 89 y.o. female.  HPI Patient presents with her daughter who assists with the history.  She presents with concern for abdominal discomfort, though she describes no focal abdominal pain, more generalized discomfort with anorexia, nausea, vomiting.  She acknowledges multiple medical issues including hypertension, renal dysfunction.  Onset was 2 days ago, since that time symptoms have been persistent.    Home Medications Prior to Admission medications   Medication Sig Start Date End Date Taking? Authorizing Provider  acetaminophen (TYLENOL) 500 MG tablet Take 500 mg by mouth in the morning and at bedtime.    [provider]  acetaminophen-codeine (TYLENOL #3) 300-30 MG tablet Take 1 tablet by mouth every 8 (eight) hours as needed for moderate pain. 02/21/23   Cammy Copa, MD  albuterol (PROVENTIL) (2.5 MG/3ML) 0.083% nebulizer solution Take 3 mLs (2.5 mg total) by nebulization every 6 (six) hours as needed for shortness of breath or wheezing. 08/03/22   Carlisle Beers, FNP  albuterol (VENTOLIN HFA) 108 (90 Base) MCG/ACT inhaler USE 2 PUFFS EVERY 6 HOURS AS NEEDED FOR WHEEZING. 08/07/22   Leslye Peer, MD  amLODipine (NORVASC) 10 MG tablet Take 1 tablet (10 mg total) by mouth daily. Patient taking differently: Take 10 mg by mouth at bedtime. 11/07/22   Osvaldo Shipper, MD  atorvastatin (LIPITOR) 40 MG tablet Take 1 tablet (40 mg total) by mouth daily. 04/21/22   Nahser, Deloris Ping, MD  atropine 1 % ophthalmic solution Place 1 drop into both eyes 2 (two) times daily.    [provider]  Azelastine-Fluticasone 137-50 MCG/ACT SUSP PLACE ONE SPRAY IN EACH NOSTRIL TWICE DAILY (IN  THE MORNING AND AT BEDTIME) 03/12/23   Byrum, Les Pou, MD  Budeson-Glycopyrrol-Formoterol (BREZTRI AEROSPHERE) 160-9-4.8 MCG/ACT AERO INHALE 2 PUFFS INTO THE LUNGS TWICE DAILY, IN THE MORNING & AT BEDTIME. 03/03/23   Byrum, Les Pou, MD  carvedilol (COREG) 12.5 MG tablet Take 1 tablet (12.5 mg total) by mouth 2 (two) times daily with a meal. 11/06/22   Osvaldo Shipper, MD  cetirizine (ZYRTEC) 10 MG tablet Take 10 mg by mouth daily as needed for allergies or rhinitis.    [provider]  citalopram (CELEXA) 20 MG tablet Take 1 tablet (20 mg total) by mouth every evening. 09/12/22   Panosh, Neta Mends, MD  clopidogrel (PLAVIX) 75 MG tablet TAKE ONE TABLET BY MOUTH ONCE DAILY. 10/19/22   Panosh, Neta Mends, MD  diclofenac Sodium (VOLTAREN) 1 % GEL Apply 4 g topically 2 (two) times daily as needed (painful areas). 05/02/22   Ellsworth Lennox, PA-C  doxazosin (CARDURA) 4 MG tablet Take 1 tablet (4 mg total) by mouth every 12 (twelve) hours. 11/06/22   Osvaldo Shipper, MD  ferrous sulfate 325 (65 FE) MG EC tablet Take 1 tablet (325 mg total) by mouth daily with breakfast. 12/15/14   Panosh, Neta Mends, MD  fluticasone (FLONASE) 50 MCG/ACT nasal spray Place 2 sprays into both nostrils as needed for allergies or rhinitis.    [provider]  folic acid (FOLVITE) 1 MG tablet Take 1 tablet (1 mg total) by mouth daily. 11/07/22   Osvaldo Shipper, MD  furosemide (LASIX) 40 MG tablet Take  1.5 tablets (60 mg total) by mouth 2 (two) times daily. 02/26/23 05/27/23  Jacklynn Ganong, FNP  hydrALAZINE (APRESOLINE) 100 MG tablet Take 1 tablet (100 mg total) by mouth 3 (three) times daily. 12/22/22   Nahser, Deloris Ping, MD  LORazepam (ATIVAN) 0.5 MG tablet TAKE ONE TABLET BY MOUTH TWICE DAILY AS NEEDED FOR ANXIETY 01/05/23   Worthy Rancher B, FNP  metolazone (ZAROXOLYN) 2.5 MG tablet Take 1 tablet (2.5 mg total) by mouth once a week. Take every Friday 02/26/23   Jacklynn Ganong, FNP  montelukast (SINGULAIR) 10 MG tablet Take  1 tablet (10 mg total) by mouth daily. Patient taking differently: Take 10 mg by mouth at bedtime. 12/27/22   Panosh, Neta Mends, MD  pantoprazole (PROTONIX) 40 MG tablet TAKE ONE TABLET BY MOUTH TWICE DAILY 03/07/23   Panosh, Neta Mends, MD  potassium chloride (KLOR-CON M) 10 MEQ tablet Take 1 tablet (10 mEq total) by mouth daily. Take an extra 20 meq (two tablets) on Mondays (this is the day you take Metolazone) 01/29/23   Milford, Anderson Malta, FNP  RESTASIS 0.05 % ophthalmic emulsion Place 1 drop into both eyes 2 (two) times daily. 10/13/21   [provider]  sodium bicarbonate 650 MG tablet Take 1 tablet (650 mg total) by mouth 2 (two) times daily. 11/06/22   Osvaldo Shipper, MD  Spacer/Aero-Holding Chambers (AEROCHAMBER PLUS) inhaler Use as instructed Patient taking differently: Use as instructed as needed 05/26/16   Domenick Gong, MD  vitamin B-12 (CYANOCOBALAMIN) 1000 MCG tablet Take 1,000 mcg by mouth daily.    [provider]  Vitamin D, Cholecalciferol, 25 MCG (1000 UT) TABS Take 1,000 Units by mouth daily.    [provider]  potassium chloride (KLOR-CON) 10 MEQ tablet TAKE FOUR TABLETS BY MOUTH DAILY. Patient taking differently: Take 20 mEq by mouth 2 (two) times daily. 10/19/22 11/06/22  Panosh, Neta Mends, MD      Allergies    Penicillins and Aspirin    Review of Systems   Review of Systems  Physical Exam Updated Vital Signs BP (!) 162/58 (BP Location: Right Arm)   Pulse (!) 53   Resp 18   Ht 5' (1.524 m)   Wt 63.5 kg   SpO2 100%   BMI 27.34 kg/m  Physical Exam Vitals and nursing note reviewed.  Constitutional:      General: She is not in acute distress.    Appearance: She is well-developed.  HENT:     Head: Normocephalic and atraumatic.  Eyes:     Conjunctiva/sclera: Conjunctivae normal.  Cardiovascular:     Rate and Rhythm: Normal rate and regular rhythm.  Pulmonary:     Effort: Pulmonary effort is normal. No respiratory distress.     Breath  sounds: No stridor.  Abdominal:     General: There is no distension.     Tenderness: There is generalized abdominal tenderness. There is guarding.  Skin:    General: Skin is warm and dry.  Neurological:     Mental Status: She is alert and oriented to person, place, and time.     Cranial Nerves: No cranial nerve deficit.  Psychiatric:        Mood and Affect: Mood normal.     ED Results / Procedures / Treatments   Labs (all labs ordered are listed, but only abnormal results are displayed) Labs Reviewed  LIPASE, BLOOD - Abnormal; Notable for the following components:      Result Value  Lipase 62 (*)    All other components within normal limits  COMPREHENSIVE METABOLIC PANEL - Abnormal; Notable for the following components:   Glucose, Bld 125 (*)    BUN 66 (*)    Creatinine, Ser 3.87 (*)    Calcium 8.6 (*)    Albumin 3.3 (*)    GFR, Estimated 11 (*)    All other components within normal limits  CBC - Abnormal; Notable for the following components:   WBC 3.9 (*)    All other components within normal limits  URINALYSIS, ROUTINE W REFLEX MICROSCOPIC - Abnormal; Notable for the following components:   Glucose, UA 50 (*)    Hgb urine dipstick SMALL (*)    Protein, ur 100 (*)    Bacteria, UA RARE (*)    All other components within normal limits    EKG None  Radiology No results found.  Procedures Procedures  {Document cardiac monitor, telemetry assessment procedure when appropriate:1}  Medications Ordered in ED Medications  ondansetron (ZOFRAN-ODT) disintegrating tablet 4 mg (4 mg Oral Given 03/13/23 1235)    ED Course/ Medical Decision Making/ A&P   {   Click here for ABCD2, HEART and other calculatorsREFRESH Note before signing :1}                              Medical Decision Making Elderly female with multiple medical issues including hypertension, CKD, now presents with diffuse abdominal pain, nausea, vomiting, anorexia.  Broad differential including  obstruction, infection, gastritis, dehydration, worsening renal function. Patient's initial vital signs are reassuring, she has mild hypertension only, she is afebrile, no early evidence for bacteremia, sepsis.  Cardiac 55 sinus normal Pulse ox 100% room air normal   Amount and/or Complexity of Data Reviewed Independent Historian:     Details: Adult child at bedside External Data Reviewed: notes. Labs: ordered. Decision-making details documented in ED Course. Radiology: ordered and independent interpretation performed. Decision-making details documented in ED Course. ECG/medicine tests: ordered and independent interpretation performed. Decision-making details documented in ED Course.  Risk Prescription drug management. Decision regarding hospitalization. Diagnosis or treatment significantly limited by social determinants of health.   ***  {Document critical care time when appropriate:1} {Document review of labs and clinical decision tools ie heart score, Chads2Vasc2 etc:1}  {Document your independent review of radiology images, and any outside records:1} {Document your discussion with family members, caretakers, and with consultants:1} {Document social determinants of health affecting pt's care:1} {Document your decision making why or why not admission, treatments were needed:1} Final Clinical Impression(s) / ED Diagnoses Final diagnoses:  None    Rx / DC Orders ED Discharge Orders     None

## 2023-03-13 NOTE — Discharge Instructions (Signed)
Your CT scan did not show any obvious cause of your abdominal discomfort.  I am going to have you do a trial of MiraLAX to see if that helps.  There was some concern about possible pneumonia but if you are not having significant cough or fever I think this is due to you having trouble taking a big deep breath during your CT scan.  As we discussed in the room please return for worsening of your symptoms inability to eat or drink or if you develop a fever.  please follow-up with your family doctor in the office.  You can try a capful of MiraLAX in 8 ounces of water once a day until you have some big bowel movements which usually takes about 3 to 4 days or alternatively you can  Take 8 scoops of miralax in 32oz of whatever you would like to drink.(Gatorade comes in this size) You can also use a fleets enema which you can buy over the counter at the pharmacy.  Return for worsening abdominal pain, vomiting or fever.

## 2023-03-13 NOTE — Telephone Encounter (Signed)
Pts grand-daughter reached out to cancel appoint today- she reports that pt has been sick vomiting the past few days, unable to keep anything down and feeling pretty horrible.  I advised grand-daughter that it sounds like she needs to be taken to ER for monitoring, labs and further care.   She is going to relay this to her aunt whom is home with her right now.  Grand-daughter is at work.   Kerry Hough, EMT-Paramedic  [phone] 03/13/2023

## 2023-03-13 NOTE — Procedures (Signed)
Lumbar Epidural Steroid Injection - Interlaminar Approach with Fluoroscopic Guidance  Patient: [name]      Date of Birth: 1933-05-31 MRN: [id number] PCP: [name], MD      Visit Date: 03/01/2023   Universal Protocol:     Consent Given By: the patient  Position: PRONE  Additional Comments: Vital signs were monitored before and after the procedure. Patient was prepped and draped in the usual sterile fashion. The correct patient, procedure, and site was verified.   Injection Procedure Details:   Procedure diagnoses: Lumbar radiculopathy [M54.16]   Meds Administered:  Meds ordered this encounter  Medications   methylPREDNISolone acetate (DEPO-MEDROL) injection 40 mg     Laterality: Left  Location/Site:  L5-S1  Needle: 3.5 in., 20 ga. Tuohy  Needle Placement: Paramedian epidural  Findings:   -Comments: Excellent flow of contrast into the epidural space.  Procedure Details: Using a paramedian approach from the side mentioned above, the region overlying the inferior lamina was localized under fluoroscopic visualization and the soft tissues overlying this structure were infiltrated with 4 ml. of 1% Lidocaine without Epinephrine. The Tuohy needle was inserted into the epidural space using a paramedian approach.   The epidural space was localized using loss of resistance along with counter oblique bi-planar fluoroscopic views.  After negative aspirate for air, blood, and CSF, a 2 ml. volume of Isovue-250 was injected into the epidural space and the flow of contrast was observed. Radiographs were obtained for documentation purposes.    The injectate was administered into the level noted above.   Additional Comments:  No complications occurred Dressing: 2 x 2 sterile gauze and Band-Aid    Post-procedure details: Patient was observed during the procedure. Post-procedure instructions were reviewed.  Patient left the clinic in stable condition.

## 2023-03-13 NOTE — ED Provider Notes (Signed)
Received patient in turnover from Dr. Jeraldine Loots.  Please see their note for further details of Hx, PE.  Briefly patient is a 89 y.o. female with a Abdominal Pain .  Diffuse abdominal pain going on for about 4 days.  Plan for CT imaging and repeat assessment.  CT scan without obvious acute pathology.  There was a read about possible multifocal pneumonia.  Patient has had no difficulty breathing no cough no fever.  I suspect it was due to poor inspiratory effort.  Let the patient follow-up with her family doctor in the office.    Melene Plan, DO 03/13/23 [phone]

## 2023-03-13 NOTE — ED Triage Notes (Signed)
Pt c/o abdominal pain and vomiting that started 4 days ago. Last BM yesterday, denies diarrhea.

## 2023-03-13 NOTE — Progress Notes (Addendum)
[name] [facility] - 89 y.o. female MRN [id number]  Date of birth: January 07, 1934  Office Visit Note: Visit Date: 03/01/2023 PCP: [name], MD Referred by: [name], MD  Subjective: Chief Complaint  Patient presents with   Lower Back - Pain   HPI:  [name] is a 89 y.o. female who comes in today for planned repeat Left L5-S1  Lumbar Interlaminar epidural steroid injection with fluoroscopic guidance.  The patient has failed conservative care including home exercise, medications, time and activity modification.  This injection will be diagnostic and hopefully therapeutic.  Please see requesting physician notes for further details and justification. Patient received more than 50% pain relief from prior injection.   Referring: Ellin Goodie, FNP   ROS Otherwise per HPI.  Assessment & Plan: Visit Diagnoses:    ICD-10-CM   1. Lumbar radiculopathy  M54.16 XR C-ARM NO REPORT    Epidural Steroid injection    methylPREDNISolone acetate (DEPO-MEDROL) injection 40 mg      Plan: No additional findings.   Meds & Orders:  Meds ordered this encounter  Medications   methylPREDNISolone acetate (DEPO-MEDROL) injection 40 mg    Orders Placed This Encounter  Procedures   XR C-ARM NO REPORT   Epidural Steroid injection    Follow-up: Return for visit to requesting provider as needed.   Procedures: No procedures performed  Lumbar Epidural Steroid Injection - Interlaminar Approach with Fluoroscopic Guidance  Patient: [name]      Date of Birth: 1933-05-20 MRN: [id number] PCP: [name], MD      Visit Date: 03/01/2023   Universal Protocol:     Consent Given By: the patient  Position: PRONE  Additional Comments: Vital signs were monitored before and after the procedure. Patient was prepped and draped in the usual sterile fashion. The correct patient, procedure, and site was verified.   Injection Procedure Details:   Procedure diagnoses: Lumbar  radiculopathy [M54.16]   Meds Administered:  Meds ordered this encounter  Medications   methylPREDNISolone acetate (DEPO-MEDROL) injection 40 mg     Laterality: Left  Location/Site:  L5-S1  Needle: 3.5 in., 20 ga. Tuohy  Needle Placement: Paramedian epidural  Findings:   -Comments: Excellent flow of contrast into the epidural space.  Procedure Details: Using a paramedian approach from the side mentioned above, the region overlying the inferior lamina was localized under fluoroscopic visualization and the soft tissues overlying this structure were infiltrated with 4 ml. of 1% Lidocaine without Epinephrine. The Tuohy needle was inserted into the epidural space using a paramedian approach.   The epidural space was localized using loss of resistance along with counter oblique bi-planar fluoroscopic views.  After negative aspirate for air, blood, and CSF, a 2 ml. volume of Isovue-250 was injected into the epidural space and the flow of contrast was observed. Radiographs were obtained for documentation purposes.    The injectate was administered into the level noted above.   Additional Comments:  No complications occurred Dressing: 2 x 2 sterile gauze and Band-Aid    Post-procedure details: Patient was observed during the procedure. Post-procedure instructions were reviewed.  Patient left the clinic in stable condition.   Clinical History: MRI LUMBAR SPINE WITHOUT CONTRAST    TECHNIQUE:  Multiplanar, multisequence MR imaging of the lumbar spine was  performed. No intravenous contrast was administered.    COMPARISON:  07/14/2011    FINDINGS:  Segmentation: 5 lumbar type vertebral bodies    Alignment: No scoliosis. Degenerative  anterolisthesis at L4-5 of 6  mm.    Vertebrae: No fracture or primary bone lesion. Chronic discogenic  marrow changes at L5-S1.    Conus medullaris: Extends to the L1 level and appears normal.    Paraspinal and other soft tissues: Simple  cysts of the left kidney  measuring 3 cm. Enlarged from 2 cm on the study of 2013.    Disc levels:    Mild noncompressive disc bulges at L3-4 and above. No disc  herniation, stenosis or neural compression.    L4-5: Advanced bilateral facet arthropathy with anterolisthesis of 6  mm. Bulging of the disc. Mild narrowing of the lateral recesses and  intervertebral foramina but without visible neural compression.    L5-S1: Chronic disc degeneration with loss of disc height, endplate  osteophytes and bulging of the disc. Discogenic changes of the L5  and S1 endplates. Facet degeneration bilaterally. Mild narrowing of  the lateral recesses and foramina but without visible neural  compression.    Since the previous study, the degenerative changes at L5-S1 have  worsened somewhat.    IMPRESSION:  L4-5: Chronic bilateral facet arthropathy allowing 6 mm of  anterolisthesis. Bulging of the disc. Mild narrowing of the lateral  recesses without visible neural compression. The findings could  relate to back pain.    L5-S1: Chronic disc degeneration with loss of disc height, endplate  osteophytes and bulging of the disc. Bilateral facet degeneration.  Mild narrowing of the lateral recesses and foramina. The findings  could contribute to back pain. There would be some potential for L5  nerve root irritation in the foramina. Degenerative changes at this  level have worsened since 2013.      Electronically Signed    By: Paulina Fusi M.D.    On: 12/13/2015 14:20     Objective:  VS:  HT:    WT:   BMI:     BP:   HR: bpm  TEMP: ( )  RESP:  Physical Exam Vitals and nursing note reviewed.  Constitutional:      General: She is not in acute distress.    Appearance: Normal appearance. She is well-developed. She is not ill-appearing.  HENT:     Head: Normocephalic and atraumatic.  Eyes:     Conjunctiva/sclera: Conjunctivae normal.     Pupils: Pupils are equal, round, and reactive to  light.  Cardiovascular:     Rate and Rhythm: Normal rate.     Pulses: Normal pulses.  Pulmonary:     Effort: Pulmonary effort is normal.  Musculoskeletal:     Right lower leg: No edema.     Left lower leg: No edema.  Skin:    General: Skin is warm and dry.     Findings: No erythema or rash.  Neurological:     General: No focal deficit present.     Mental Status: She is alert and oriented to person, place, and time.     Sensory: No sensory deficit.     Motor: No abnormal muscle tone.     Coordination: Coordination normal.     Gait: Gait normal.  Psychiatric:        Mood and Affect: Mood normal.        Behavior: Behavior normal.      Imaging:

## 2023-03-19 ENCOUNTER — Telehealth (HOSPITAL_COMMUNITY): Payer: Self-pay

## 2023-03-21 ENCOUNTER — Telehealth (HOSPITAL_COMMUNITY): Payer: Self-pay

## 2023-03-21 NOTE — Telephone Encounter (Addendum)
Attempted to reach out to pt to f/u for a visit. She had to go to ER last week and we missed our appoint last week.  I called today but no answer, I did LVM for her to return my call.  I will be off Tuesday and Thursday this week.    Kerry Hough, EMT-Paramedic  [phone] 03/19/2023

## 2023-03-21 NOTE — Telephone Encounter (Signed)
Pt had called me back yesterday but I was off, I called her back today and since I am off tomor as well, and today is booked already, she agreed that next Monday will be fine.  She reports feeling much better since last week.  So I have her down for Monday.   Kerry Hough, EMT-Paramedic  [phone] 03/21/2023

## 2023-03-26 ENCOUNTER — Other Ambulatory Visit (HOSPITAL_COMMUNITY): Payer: Self-pay

## 2023-03-26 ENCOUNTER — Other Ambulatory Visit: Payer: Self-pay | Admitting: Internal Medicine

## 2023-03-26 NOTE — Progress Notes (Signed)
Paramedicine Encounter    Patient ID: [name], female    DOB: 1933-09-15, 89 y.o.   MRN: [id number]   Complaints-stomach issues at times--constipation   Edema-yes to her legs   Compliance with meds-yes   Pill box filled-yes  If so, by whom-paramedic   Refills needed- Amlodipine -too soon to fill -try again next week  Carvedilol Clopidogrel-missing in fri-sat am dose   Iron   Pt reports she is doing ok since she was seen in the ER. She reports she ended up being constipated. She is now taking miralax to help relieve that.  Her weight is up but she is not taking having BM daily.  Her appetite is increased too.  Pt denies dizziness, no c/p.  Sometimes she does get wheezy but takes her inhalers and it helps.   When taking bp her HR got up to 110 irregular -just taken meds about an hour prior to this. Kept check on her HR for the next 10-43min before leaving and her HR seemed to stay in the mid 50s.  She did not feel any palpitations at that time when her HR increased.   Meds verified and pill box refilled.  Called in those meds for refills. She will get someone to p/u for her.    BP 138/60   Resp 16   Wt 150 lb (68 kg)   SpO2 99%   BMI 29.29 kg/m  Weight yesterday-? Last visit weight-147   Patient Care Team: Panosh, Neta Mends, MD as PCP - General (Internal Medicine) Nahser, Deloris Ping, MD as PCP - Cardiology (Cardiology) August Saucer, Corrie Mckusick, MD (Orthopedic Surgery) Malachy Mood, MD as Consulting Physician (Hematology) Bufford Buttner, MD as Consulting Physician (Nephrology) Mateo Flow, MD as Consulting Physician (Ophthalmology) Eber Jones, MD as Referring Physician (Ophthalmology) Delton Coombes Les Pou, MD as Consulting Physician (Pulmonary Disease) Sherrill Raring, [facility] (Pharmacist)  Patient Active Problem List   Diagnosis Date Noted   CAP (community acquired pneumonia) 10/31/2022   Diastolic congestive heart failure (HCC) 10/31/2022   Hypertensive urgency  10/31/2022   Moderate persistent asthma 10/31/2022   Hypomagnesemia 10/31/2022   CKD (chronic kidney disease), stage IV (HCC) 10/31/2022   Pulmonary hypertension, unspecified (HCC) 12/06/2021   Chronic cough 10/18/2021   Chronic rhinitis 11/24/2019   Nausea 11/03/2018   Elevated troponin 11/03/2018   Hyponatremia 11/03/2018   Hypertensive heart disease with hypertensive chronic kidney disease 11/30/2017   Fasting hyperglycemia 11/30/2017   Renal cyst 03/27/2016   CKD (chronic kidney disease) stage 3, GFR 30-59 ml/min (HCC) 08/23/2015   Subjective hearing change 06/29/2014   Resistant hypertension 06/29/2014   Lumbago 06/15/2014   Pre-diabetes vs early DM  06/15/2014   Dyspnea 04/27/2014   Asthma, chronic 04/13/2014   Elevated uric acid in blood 04/13/2014   Essential hypertension 03/27/2014   History of DVT (deep vein thrombosis)    Palpitations 01/05/2014   Anemia of chronic disease 2014-01-06   Death of family member 09/10/13   Leg edema 06/20/2013   Bereavement due to life event 04/21/2013   Other dysphagia 02/11/2013   Colon cancer screening 02/11/2013   Anxiety state 01/20/2013   Leg cramps 01/20/2013   Back pain 12/05/2012   Family hx of colon cancer 10/21/2012   Family hx of lung cancer 10/21/2012   Family hx-breast malignancy 10/21/2012   Renal insufficiency creatinine 1.3 10/21/2012   Anemia, chronic disease 10/21/2012   GERD (gastroesophageal reflux disease) 09/08/2012   HTN (hypertension) 09/08/2012   Hyperlipidemia  09/08/2012   Varicose veins of leg with complications 12/05/2010    Current Outpatient Medications:    acetaminophen (TYLENOL) 500 MG tablet, Take 500 mg by mouth in the morning and at bedtime., Disp: , Rfl:    acetaminophen-codeine (TYLENOL #3) 300-30 MG tablet, Take 1 tablet by mouth every 8 (eight) hours as needed for moderate pain., Disp: 25 tablet, Rfl: 0   albuterol (PROVENTIL) (2.5 MG/3ML) 0.083% nebulizer solution, Take 3 mLs (2.5 mg  total) by nebulization every 6 (six) hours as needed for shortness of breath or wheezing., Disp: 360 mL, Rfl: 6   albuterol (VENTOLIN HFA) 108 (90 Base) MCG/ACT inhaler, USE 2 PUFFS EVERY 6 HOURS AS NEEDED FOR WHEEZING., Disp: 8.5 g, Rfl: 3   amLODipine (NORVASC) 10 MG tablet, Take 1 tablet (10 mg total) by mouth daily. (Patient taking differently: Take 10 mg by mouth at bedtime.), Disp: 30 tablet, Rfl: 1   atorvastatin (LIPITOR) 40 MG tablet, Take 1 tablet (40 mg total) by mouth daily., Disp: 90 tablet, Rfl: 3   atropine 1 % ophthalmic solution, Place 1 drop into both eyes 2 (two) times daily., Disp: , Rfl:    Azelastine-Fluticasone 137-50 MCG/ACT SUSP, PLACE ONE SPRAY IN EACH NOSTRIL TWICE DAILY (IN THE MORNING AND AT BEDTIME), Disp: 23 g, Rfl: 6   Budeson-Glycopyrrol-Formoterol (BREZTRI AEROSPHERE) 160-9-4.8 MCG/ACT AERO, INHALE 2 PUFFS INTO THE LUNGS TWICE DAILY, IN THE MORNING & AT BEDTIME., Disp: 10.7 g, Rfl: 2   carvedilol (COREG) 12.5 MG tablet, Take 1 tablet (12.5 mg total) by mouth 2 (two) times daily with a meal., Disp: 60 tablet, Rfl: 1   cetirizine (ZYRTEC) 10 MG tablet, Take 10 mg by mouth daily as needed for allergies or rhinitis., Disp: , Rfl:    citalopram (CELEXA) 20 MG tablet, Take 1 tablet (20 mg total) by mouth every evening., Disp: 90 tablet, Rfl: 0   clopidogrel (PLAVIX) 75 MG tablet, TAKE ONE TABLET BY MOUTH ONCE DAILY., Disp: 30 tablet, Rfl: 1   diclofenac Sodium (VOLTAREN) 1 % GEL, Apply 4 g topically 2 (two) times daily as needed (painful areas)., Disp: 50 g, Rfl: 1   doxazosin (CARDURA) 4 MG tablet, Take 1 tablet (4 mg total) by mouth every 12 (twelve) hours., Disp: 60 tablet, Rfl: 1   ferrous sulfate 325 (65 FE) MG EC tablet, Take 1 tablet (325 mg total) by mouth daily with breakfast., Disp: 30 tablet, Rfl: 3   fluticasone (FLONASE) 50 MCG/ACT nasal spray, Place 2 sprays into both nostrils as needed for allergies or rhinitis., Disp: , Rfl:    folic acid (FOLVITE) 1 MG  tablet, Take 1 tablet (1 mg total) by mouth daily., Disp: 30 tablet, Rfl: 1   furosemide (LASIX) 40 MG tablet, Take 1.5 tablets (60 mg total) by mouth 2 (two) times daily., Disp: 270 tablet, Rfl: 3   hydrALAZINE (APRESOLINE) 100 MG tablet, Take 1 tablet (100 mg total) by mouth 3 (three) times daily., Disp: 90 tablet, Rfl: 1   metolazone (ZAROXOLYN) 2.5 MG tablet, Take 1 tablet (2.5 mg total) by mouth once a week. Take every Friday, Disp: 15 tablet, Rfl: 3   montelukast (SINGULAIR) 10 MG tablet, Take 1 tablet (10 mg total) by mouth daily. (Patient taking differently: Take 10 mg by mouth at bedtime.), Disp: 90 tablet, Rfl: 0   pantoprazole (PROTONIX) 40 MG tablet, TAKE ONE TABLET BY MOUTH TWICE DAILY, Disp: 180 tablet, Rfl: 1   potassium chloride (KLOR-CON M) 10 MEQ tablet, Take 1 tablet (  10 mEq total) by mouth daily. Take an extra 20 meq (two tablets) on Mondays (this is the day you take Metolazone) (Patient taking differently: Take 10 mEq by mouth daily. Take an extra 20 meq (two tablets) on Fridays (this is the day you take Metolazone)), Disp: 120 tablet, Rfl: 3   sodium bicarbonate 650 MG tablet, Take 1 tablet (650 mg total) by mouth 2 (two) times daily., Disp: 60 tablet, Rfl: 1   Spacer/Aero-Holding Chambers (AEROCHAMBER PLUS) inhaler, Use as instructed (Patient taking differently: Use as instructed as needed), Disp: 1 each, Rfl: 2   vitamin B-12 (CYANOCOBALAMIN) 1000 MCG tablet, Take 1,000 mcg by mouth daily., Disp: , Rfl:    Vitamin D, Cholecalciferol, 25 MCG (1000 UT) TABS, Take 1,000 Units by mouth daily., Disp: , Rfl:    LORazepam (ATIVAN) 0.5 MG tablet, TAKE ONE TABLET BY MOUTH TWICE DAILY AS NEEDED FOR ANXIETY, Disp: 30 tablet, Rfl: 0   ondansetron (ZOFRAN-ODT) 4 MG disintegrating tablet, 4mg  ODT q4 hours prn nausea/vomit, Disp: 20 tablet, Rfl: 0   RESTASIS 0.05 % ophthalmic emulsion, Place 1 drop into both eyes 2 (two) times daily., Disp: , Rfl:  Allergies  Allergen Reactions   Penicillins  Nausea And Vomiting and Other (See Comments)    Has patient had a PCN reaction causing immediate rash, facial/tongue/throat swelling, SOB or lightheadedness with hypotension: Y Has patient had a PCN reaction causing severe rash involving mucus membranes or skin necrosis: Y Has patient had a PCN reaction that required hospitalization: N Has patient had a PCN reaction occurring within the last 10 years: N If all of the above answers are "NO", then may proceed with Cephalosporin use.    Aspirin Other (See Comments)    Wheezing Acetaminophen is OK       Social History   Socioeconomic History   Marital status: Single    Spouse name: Not on file   Number of children: 6   Years of education: Not on file   Highest education level: Not on file  Occupational History   Occupation: retired  Tobacco Use   Smoking status: Never   Smokeless tobacco: Never  Vaping Use   Vaping status: Never Used  Substance and Sexual Activity   Alcohol use: No   Drug use: No   Sexual activity: Not Currently    Birth control/protection: Surgical  Other Topics Concern   Not on file  Social History Narrative   2 people living in the home.  Grand daughter.   Up and down through the night      Had 7 children  2 deceased . 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    works 3 days per week currently .   Glasses dentures  Neg tad    Social Determinants of Health   Financial Resource Strain: Low Risk  (04/14/2022)   Overall Financial Resource Strain (CARDIA)    Difficulty of Paying Living Expenses: Not very hard  Food Insecurity: No Food Insecurity (12/01/2022)   Hunger Vital Sign    Worried About Running Out of Food in the Last Year: Never true    Ran Out of Food in the Last Year: Never true  Transportation Needs: No Transportation Needs (12/01/2022)   PRAPARE - Administrator, Civil Service (Medical): No    Lack of  Transportation (Non-Medical): No  Physical Activity: Not on file  Stress: Not on file  Social Connections: Not on file  Intimate Partner Violence: Not At Risk (10/31/2022)   Humiliation, Afraid, Rape, and Kick questionnaire    Fear of Current or Ex-Partner: No    Emotionally Abused: No    Physically Abused: No    Sexually Abused: No    Physical Exam      Future Appointments  Date Time Provider Department Center  03/27/2023 12:30 PM [facility] None  03/27/2023  1:00 PM Carlean Jews, NP [facility] None  03/27/2023  1:45 PM [facility] FLUSH [facility] None  04/17/2023 11:30 AM Panosh, Neta Mends, MD [facility]  04/19/2023  1:45 PM Leslye Peer, MD [facility] None  04/24/2023 11:40 AM Dorthula Nettles, DO [facility] None       Kerry Hough, Paramedic [phone] [facility] Paramedic  03/26/23

## 2023-03-27 ENCOUNTER — Encounter: Payer: Self-pay | Admitting: Nurse Practitioner

## 2023-03-27 ENCOUNTER — Inpatient Hospital Stay: Payer: Medicare Other | Admitting: Nurse Practitioner

## 2023-03-27 ENCOUNTER — Inpatient Hospital Stay: Payer: Medicare Other

## 2023-03-27 ENCOUNTER — Other Ambulatory Visit: Payer: Self-pay | Admitting: Cardiovascular Disease

## 2023-03-27 ENCOUNTER — Other Ambulatory Visit: Payer: Self-pay

## 2023-03-27 ENCOUNTER — Inpatient Hospital Stay: Payer: Medicare Other | Attending: Hematology

## 2023-03-27 VITALS — BP 138/68 | HR 57 | Temp 97.8°F | Resp 17 | Wt 152.7 lb

## 2023-03-27 DIAGNOSIS — R109 Unspecified abdominal pain: Secondary | ICD-10-CM | POA: Insufficient documentation

## 2023-03-27 DIAGNOSIS — D638 Anemia in other chronic diseases classified elsewhere: Secondary | ICD-10-CM

## 2023-03-27 DIAGNOSIS — Z886 Allergy status to analgesic agent status: Secondary | ICD-10-CM | POA: Diagnosis not present

## 2023-03-27 DIAGNOSIS — Z7902 Long term (current) use of antithrombotics/antiplatelets: Secondary | ICD-10-CM | POA: Diagnosis not present

## 2023-03-27 DIAGNOSIS — K449 Diaphragmatic hernia without obstruction or gangrene: Secondary | ICD-10-CM | POA: Diagnosis not present

## 2023-03-27 DIAGNOSIS — K4090 Unilateral inguinal hernia, without obstruction or gangrene, not specified as recurrent: Secondary | ICD-10-CM | POA: Diagnosis not present

## 2023-03-27 DIAGNOSIS — R519 Headache, unspecified: Secondary | ICD-10-CM | POA: Insufficient documentation

## 2023-03-27 DIAGNOSIS — I700 Atherosclerosis of aorta: Secondary | ICD-10-CM | POA: Diagnosis not present

## 2023-03-27 DIAGNOSIS — Z79899 Other long term (current) drug therapy: Secondary | ICD-10-CM | POA: Insufficient documentation

## 2023-03-27 DIAGNOSIS — Z880 Allergy status to penicillin: Secondary | ICD-10-CM | POA: Diagnosis not present

## 2023-03-27 DIAGNOSIS — M4726 Other spondylosis with radiculopathy, lumbar region: Secondary | ICD-10-CM | POA: Diagnosis not present

## 2023-03-27 LAB — CBC WITH DIFFERENTIAL (CANCER CENTER ONLY)
Abs Immature Granulocytes: 0.02 10*3/uL (ref 0.00–0.07)
Basophils Absolute: 0 10*3/uL (ref 0.0–0.1)
Basophils Relative: 1 %
Eosinophils Absolute: 0.1 10*3/uL (ref 0.0–0.5)
Eosinophils Relative: 3 %
HCT: 34.2 % — ABNORMAL LOW (ref 36.0–46.0)
Hemoglobin: 10.7 g/dL — ABNORMAL LOW (ref 12.0–15.0)
Immature Granulocytes: 1 %
Lymphocytes Relative: 38 %
Lymphs Abs: 1.3 10*3/uL (ref 0.7–4.0)
MCH: 29 pg (ref 26.0–34.0)
MCHC: 31.3 g/dL (ref 30.0–36.0)
MCV: 92.7 fL (ref 80.0–100.0)
Monocytes Absolute: 0.3 10*3/uL (ref 0.1–1.0)
Monocytes Relative: 8 %
Neutro Abs: 1.7 10*3/uL (ref 1.7–7.7)
Neutrophils Relative %: 49 %
Platelet Count: 136 10*3/uL — ABNORMAL LOW (ref 150–400)
RBC: 3.69 MIL/uL — ABNORMAL LOW (ref 3.87–5.11)
RDW: 14.7 % (ref 11.5–15.5)
WBC Count: 3.3 10*3/uL — ABNORMAL LOW (ref 4.0–10.5)
nRBC: 0 % (ref 0.0–0.2)

## 2023-03-27 LAB — FERRITIN: Ferritin: 212 ng/mL (ref 11–307)

## 2023-03-27 MED ORDER — DARBEPOETIN ALFA 100 MCG/0.5ML IJ SOSY
100.0000 ug | PREFILLED_SYRINGE | Freq: Once | INTRAMUSCULAR | Status: AC
Start: 2023-03-27 — End: 2023-03-27
  Administered 2023-03-27: 100 ug via SUBCUTANEOUS
  Filled 2023-03-27: qty 0.5

## 2023-03-27 NOTE — Progress Notes (Signed)
Patient Care Team: Panosh, Neta Mends, MD as PCP - General (Internal Medicine) Nahser, Deloris Ping, MD as PCP - Cardiology (Cardiology) August Saucer Corrie Mckusick, MD (Orthopedic Surgery) Malachy Mood, MD as Consulting Physician (Hematology) Bufford Buttner, MD as Consulting Physician (Nephrology) Mateo Flow, MD as Consulting Physician (Ophthalmology) Eber Jones, MD as Referring Physician (Ophthalmology) Leslye Peer, MD as Consulting Physician (Pulmonary Disease) Sherrill Raring [facility] (Pharmacist)  Clinic Day:  03/27/2023  Referring physician: [name], MD  ASSESSMENT & PLAN:   Assessment & Plan: Anemia of chronic disease -Her anemia is likely secondary to CKD  -she was started on Aranesp q2weeks injections on 03/24/21. She understood risks vs benefits of this medication, including increased risk of thrombosis.  -hospitalized for pneumonia in 10/2022, anemia got worse, will slightly increase Aranesp dose    Plan: Labs reviewed  -CBC showing WBC 3.3; Hgb 10.7; Hct 24.2; Plt 136; Anc 1.7 -ferritin - pending Will give Aranesp injection today.  Continue with monthly lab monitoring along with Aranesp injections for hemoglobin level < 11 -labs with follow up in 4 months.   The patient understands the plans discussed today and is in agreement with them.  She knows to contact our office if she develops concerns prior to her next appointment.  I provided 25 minutes of face-to-face time during this encounter and > 50% was spent counseling as documented under my assessment and plan.    Carlean Jews, NP  [facility] - A DEPT OF [facility] [address] [place] [address] Dept: [phone] Dept Fax: [phone]   No orders of the defined types were placed in this encounter.     CHIEF COMPLAINT:  CC: f/u anemia of chronic disease   Current Treatment:  Aranesp injections, q2weeks, starting  03/24/21, will change to q6weeks starting 07/18/21, hold if Hg>=11.0   INTERVAL HISTORY:  [name] is here today for repeat clinical assessment. Last saw Dr. Mosetta Putt 12/03/2022. Labs are monitored monthly. Most recent Hgb done 03/13/2023 was 12.4. Aranesp is held for hemoglobin over 11.0. she was seen in ED due to abdominal pain in 02/2023. She had CT scan of abdomen and pelvis. She was found to have scattered bibasilar ground-glass consolidation, which may reflect multifocal infection/inflammation versus hypoventilatory changes. She does have long history of allergies and asthma. She is feeling a bit better today. Has been having more frequent headaches. She denies fevers or chills. She denies pain. Her appetite is good. Her weight has increased 12 pounds over last month .  I have reviewed the past medical history, past surgical history, social history and family history with the patient and they are unchanged from previous note.  ALLERGIES:  is allergic to penicillins and aspirin.  MEDICATIONS:  Current Outpatient Medications  Medication Sig Dispense Refill   acetaminophen (TYLENOL) 500 MG tablet Take 500 mg by mouth in the morning and at bedtime.     acetaminophen-codeine (TYLENOL #3) 300-30 MG tablet Take 1 tablet by mouth every 8 (eight) hours as needed for moderate pain. 25 tablet 0   albuterol (PROVENTIL) (2.5 MG/3ML) 0.083% nebulizer solution Take 3 mLs (2.5 mg total) by nebulization every 6 (six) hours as needed for shortness of breath or wheezing. 360 mL 6   albuterol (VENTOLIN HFA) 108 (90 Base) MCG/ACT inhaler USE 2 PUFFS EVERY 6 HOURS AS NEEDED FOR WHEEZING. 8.5 g 3   amLODipine (NORVASC) 10 MG tablet Take 1 tablet (  10 mg total) by mouth daily. (Patient taking differently: Take 10 mg by mouth at bedtime.) 30 tablet 1   atorvastatin (LIPITOR) 40 MG tablet Take 1 tablet (40 mg total) by mouth daily. 90 tablet 3   atropine 1 % ophthalmic solution Place 1 drop into both eyes 2 (two) times daily.      Azelastine-Fluticasone 137-50 MCG/ACT SUSP PLACE ONE SPRAY IN EACH NOSTRIL TWICE DAILY (IN THE MORNING AND AT BEDTIME) 23 g 6   Budeson-Glycopyrrol-Formoterol (BREZTRI AEROSPHERE) 160-9-4.8 MCG/ACT AERO INHALE 2 PUFFS INTO THE LUNGS TWICE DAILY, IN THE MORNING & AT BEDTIME. 10.7 g 2   carvedilol (COREG) 12.5 MG tablet Take 1 tablet (12.5 mg total) by mouth 2 (two) times daily with a meal. 60 tablet 1   cetirizine (ZYRTEC) 10 MG tablet Take 10 mg by mouth daily as needed for allergies or rhinitis.     citalopram (CELEXA) 20 MG tablet Take 1 tablet (20 mg total) by mouth every evening. 90 tablet 0   clopidogrel (PLAVIX) 75 MG tablet TAKE ONE TABLET BY MOUTH ONCE DAILY. 30 tablet 1   diclofenac Sodium (VOLTAREN) 1 % GEL Apply 4 g topically 2 (two) times daily as needed (painful areas). 50 g 1   doxazosin (CARDURA) 4 MG tablet Take 1 tablet (4 mg total) by mouth every 12 (twelve) hours. 60 tablet 1   ferrous sulfate 325 (65 FE) MG EC tablet Take 1 tablet (325 mg total) by mouth daily with breakfast. 30 tablet 3   fluticasone (FLONASE) 50 MCG/ACT nasal spray Place 2 sprays into both nostrils as needed for allergies or rhinitis.     folic acid (FOLVITE) 1 MG tablet Take 1 tablet (1 mg total) by mouth daily. 30 tablet 1   furosemide (LASIX) 40 MG tablet Take 1.5 tablets (60 mg total) by mouth 2 (two) times daily. 270 tablet 3   hydrALAZINE (APRESOLINE) 100 MG tablet Take 1 tablet (100 mg total) by mouth 3 (three) times daily. 90 tablet 1   LORazepam (ATIVAN) 0.5 MG tablet TAKE ONE TABLET BY MOUTH TWICE DAILY AS NEEDED FOR ANXIETY 30 tablet 0   metolazone (ZAROXOLYN) 2.5 MG tablet Take 1 tablet (2.5 mg total) by mouth once a week. Take every Friday 15 tablet 3   montelukast (SINGULAIR) 10 MG tablet Take 1 tablet (10 mg total) by mouth daily. (Patient taking differently: Take 10 mg by mouth at bedtime.) 90 tablet 0   ondansetron (ZOFRAN-ODT) 4 MG disintegrating tablet 4mg  ODT q4 hours prn nausea/vomit 20  tablet 0   pantoprazole (PROTONIX) 40 MG tablet TAKE ONE TABLET BY MOUTH TWICE DAILY 180 tablet 1   potassium chloride (KLOR-CON M) 10 MEQ tablet Take 1 tablet (10 mEq total) by mouth daily. Take an extra 20 meq (two tablets) on Mondays (this is the day you take Metolazone) (Patient taking differently: Take 10 mEq by mouth daily. Take an extra 20 meq (two tablets) on Fridays (this is the day you take Metolazone)) 120 tablet 3   RESTASIS 0.05 % ophthalmic emulsion Place 1 drop into both eyes 2 (two) times daily.     sodium bicarbonate 650 MG tablet Take 1 tablet (650 mg total) by mouth 2 (two) times daily. 60 tablet 1   Spacer/Aero-Holding Chambers (AEROCHAMBER PLUS) inhaler Use as instructed (Patient taking differently: Use as instructed as needed) 1 each 2   vitamin B-12 (CYANOCOBALAMIN) 1000 MCG tablet Take 1,000 mcg by mouth daily.     Vitamin D, Cholecalciferol, 25 MCG (  1000 UT) TABS Take 1,000 Units by mouth daily.     No current facility-administered medications for this visit.     REVIEW OF SYSTEMS:   Constitutional: Denies fevers, chills or abnormal weight loss Eyes: Denies blurriness of vision Ears, nose, mouth, throat, and face: Denies mucositis or sore throat Respiratory: Denies cough or dyspnea. Does have some wheezing which is relieved after using inhaler.  Cardiovascular: Denies palpitation, chest discomfort. She does have lower extremity swelling. She has history of CHF and is followed closely by heart failure clinic.  Gastrointestinal:  does have some abdominal pain. Feels like she has been constipated. This is gradually improving.  Skin: Denies abnormal skin rashes Lymphatics: Denies new lymphadenopathy or easy bruising Neurological:Denies numbness, tingling or new weaknesses. Does have new headache.  Behavioral/Psych: Mood is stable, no new changes  All other systems were reviewed with the patient and are negative.   VITALS:   Today's Vitals   03/27/23 1304 03/27/23  1305  BP: 138/68   Pulse: (!) 57   Resp: 17   Temp: 97.8 F (36.6 C)   TempSrc: Temporal   SpO2: 100%   Weight: 152 lb 11.2 oz (69.3 kg)   PainSc:  6    Body mass index is 29.82 kg/m.   Wt Readings from Last 3 Encounters:  03/27/23 152 lb 11.2 oz (69.3 kg)  03/26/23 150 lb (68 kg)  03/13/23 140 lb (63.5 kg)    Body mass index is 29.82 kg/m.  Performance status (ECOG): 1 - Symptomatic but completely ambulatory  PHYSICAL EXAM:   GENERAL:alert, no distress and comfortable SKIN: skin color, texture, turgor are normal, no rashes or significant lesions EYES: normal, Conjunctiva are pink and non-injected, sclera clear OROPHARYNX:no exudate, no erythema and lips, buccal mucosa, and tongue normal  NECK: supple, thyroid normal size, non-tender, without nodularity LYMPH:  no palpable lymphadenopathy in the cervical, axillary or inguinal LUNGS: clear to auscultation and percussion with normal breathing effort. Has some mild wheezing in upper lung lobes.  HEART: regular rate & rhythm and no murmurs. She does have non-pitting edema in bilateral lower extremities. She is at baseline.  ABDOMEN:abdomen soft, non-tender and normal bowel sounds Musculoskeletal:no cyanosis of digits and no clubbing  NEURO: alert & oriented x 3 with fluent speech, no focal motor/sensory deficits  LABORATORY DATA:  I have reviewed the data as listed    Component Value Date/Time   NA 139 03/13/2023 1232   NA 142 09/13/2022 0000   NA 136 06/19/2016 1301   K 3.6 03/13/2023 1232   K 3.8 06/19/2016 1301   CL 103 03/13/2023 1232   CO2 24 03/13/2023 1232   CO2 31 (H) 06/19/2016 1301   GLUCOSE 125 (H) 03/13/2023 1232   GLUCOSE 105 06/19/2016 1301   BUN 66 (H) 03/13/2023 1232   BUN 56 (A) 09/13/2022 0000   BUN 26.6 (H) 06/19/2016 1301   CREATININE 3.87 (H) 03/13/2023 1232   CREATININE 2.31 (H) 03/10/2020 1149   CREATININE 1.4 (H) 06/19/2016 1301   CALCIUM 8.6 (L) 03/13/2023 1232   CALCIUM 9.8 06/19/2016  1301   PROT 6.7 03/13/2023 1232   PROT 6.6 06/19/2016 1301   ALBUMIN 3.3 (L) 03/13/2023 1232   ALBUMIN 3.5 06/19/2016 1301   AST 22 03/13/2023 1232   AST 22 06/19/2016 1301   ALT 12 03/13/2023 1232   ALT 14 06/19/2016 1301   ALKPHOS 55 03/13/2023 1232   ALKPHOS 37 (L) 06/19/2016 1301   BILITOT 0.6 03/13/2023 1232  BILITOT 0.32 06/19/2016 1301   GFRNONAA 11 (L) 03/13/2023 1232   GFRNONAA 19 (L) 03/10/2020 1149   GFRAA 19 11/04/2020 0000   GFRAA 22 (L) 03/10/2020 1149    Lab Results  Component Value Date   WBC 3.3 (L) 03/27/2023   NEUTROABS 1.7 03/27/2023   HGB 10.7 (L) 03/27/2023   HCT 34.2 (L) 03/27/2023   MCV 92.7 03/27/2023   PLT 136 (L) 03/27/2023       RADIOGRAPHIC STUDIES: I have personally reviewed the radiological images as listed and agreed with the findings in the report. CT ABDOMEN PELVIS WO CONTRAST  Result Date: 03/13/2023 CLINICAL DATA:  Abdominal pain EXAM: CT ABDOMEN AND PELVIS WITHOUT CONTRAST TECHNIQUE: Multidetector CT imaging of the abdomen and pelvis was performed following the standard protocol without IV contrast. Unenhanced CT was performed per clinician order. Lack of IV contrast limits sensitivity and specificity, especially for evaluation of abdominal/pelvic solid viscera. RADIATION DOSE REDUCTION: This exam was performed according to the departmental dose-optimization program which includes automated exposure control, adjustment of the mA and/or kV according to patient size and/or use of iterative reconstruction technique. COMPARISON:  09/12/2021 FINDINGS: Lower chest: Scattered ground-glass opacities are seen at the lung bases, left greater than right, which may reflect focal inflammatory/infectious etiology versus hypoventilatory change. No effusion. Hepatobiliary: Unremarkable unenhanced appearance of the liver and gallbladder. Pancreas: Unremarkable unenhanced appearance. Spleen: Unremarkable unenhanced appearance. Adrenals/Urinary Tract: No  urinary tract calculi or obstructive uropathy within either kidney. The adrenals and bladder are unremarkable. Stomach/Bowel: No bowel obstruction or ileus. The appendix, if still present, is not well visualized. No bowel wall thickening or inflammatory change. Small hiatal hernia. Vascular/Lymphatic: Aortic atherosclerosis. No enlarged abdominal or pelvic lymph nodes. Reproductive: Status post hysterectomy. No adnexal masses. Other: No free fluid or free intraperitoneal gas. Small fat containing left inguinal hernia unchanged. Musculoskeletal: No acute or destructive bony abnormalities. Diffuse lumbar spondylosis greatest at the lumbosacral junction. Reconstructed images demonstrate no additional findings. IMPRESSION: 1. Scattered bibasilar ground-glass consolidation, which may reflect multifocal infection/inflammation versus hypoventilatory change. 2. No acute intra-abdominal or intrapelvic process. 3. Small hiatal hernia and fat containing left inguinal hernia. 4.  Aortic Atherosclerosis (ICD10-I70.0). Electronically Signed   By: Sharlet Salina M.D.   On: 03/13/2023 17:54   XR C-ARM NO REPORT  Result Date: 03/01/2023 Please see Notes tab for imaging impression.  Epidural Steroid injection  Result Date: 03/01/2023 Tyrell Antonio, MD     03/13/2023  7:05 PM Lumbar Epidural Steroid Injection - Interlaminar Approach with Fluoroscopic Guidance Patient: Brandlyn Paolo     Date of Birth: 1933/06/30 MRN: [id number] PCP: [name], MD     Visit Date: 03/01/2023  Universal Protocol:   Consent Given By: the patient Position: PRONE Additional Comments: Vital signs were monitored before and after the procedure. Patient was prepped and draped in the usual sterile fashion. The correct patient, procedure, and site was verified. Injection Procedure Details: Procedure diagnoses: Lumbar radiculopathy [M54.16] Meds Administered: Meds ordered this encounter Medications  methylPREDNISolone acetate (DEPO-MEDROL)  injection 40 mg  Laterality: Left Location/Site:  L5-S1 Needle: 3.5 in., 20 ga. Tuohy Needle Placement: Paramedian epidural Findings:  -Comments: Excellent flow of contrast into the epidural space. Procedure Details: Using a paramedian approach from the side mentioned above, the region overlying the inferior lamina was localized under fluoroscopic visualization and the soft tissues overlying this structure were infiltrated with 4 ml. of 1% Lidocaine without Epinephrine. The Tuohy needle was inserted into the epidural space using  a paramedian approach. The epidural space was localized using loss of resistance along with counter oblique bi-planar fluoroscopic views.  After negative aspirate for air, blood, and CSF, a 2 ml. volume of Isovue-250 was injected into the epidural space and the flow of contrast was observed. Radiographs were obtained for documentation purposes.  The injectate was administered into the level noted above. Additional Comments: No complications occurred Dressing: 2 x 2 sterile gauze and Band-Aid  Post-procedure details: Patient was observed during the procedure. Post-procedure instructions were reviewed. Patient left the clinic in stable condition.

## 2023-03-27 NOTE — Patient Instructions (Signed)

## 2023-03-27 NOTE — Assessment & Plan Note (Addendum)
-  Her anemia is likely secondary to CKD  -she was started on Aranesp q2weeks injections on 03/24/21. She understood risks vs benefits of this medication, including increased risk of thrombosis.  -hospitalized for pneumonia in 10/2022, anemia got worse and aranesp dosing was increased.  Most recently, Aranesp held due to normal hemoglobin level.

## 2023-03-29 ENCOUNTER — Telehealth: Payer: Self-pay | Admitting: Nurse Practitioner

## 2023-04-02 ENCOUNTER — Telehealth (HOSPITAL_COMMUNITY): Payer: Self-pay | Admitting: Cardiology

## 2023-04-02 ENCOUNTER — Other Ambulatory Visit: Payer: Self-pay | Admitting: Internal Medicine

## 2023-04-02 ENCOUNTER — Other Ambulatory Visit (HOSPITAL_COMMUNITY): Payer: Self-pay

## 2023-04-02 MED ORDER — ISOSORBIDE MONONITRATE ER 30 MG PO TB24: 30.0000 mg | ORAL_TABLET | Freq: Every day | ORAL | 3 refills | Status: DC

## 2023-04-02 NOTE — Telephone Encounter (Signed)
-----   Message from [facility] Katie L sent at 04/02/2023 12:21 PM EST ----- Regarding: b/p high Hey,   Today during our visit her b/p very high at 190/78. Took meds about 2 hrs prior to our visit.  No recent missed doses. Rechecked it about later and it was same.   Head hurt last night and still hurts some this morning.  No dizziness, no c/p. No vision issues.    Last week, I had sent in message b/c I had caught her HR beating close to 110 irregularly but by time I got EKG done it had returned to normal.   Kerry Hough, EMT-Paramedic  [phone] 04/02/2023

## 2023-04-02 NOTE — Progress Notes (Signed)
Paramedicine Encounter    Patient ID: [name], female    DOB: [birth year], 89 y.o.   MRN: [id number]   Complaints-stomach issues-constipation   Edema-yes-wearing compression stockings, weight down   Compliance with meds-yes   Pill box filled-yes  If so, by whom-paramedic    Refills needed- Carvedilol Sodium bicarb Folic acid  Montelukast  Iron   Pt reports last night she woke up having asthma attack and used neb tx and that helped her. She reports the breztri is expensive, so I told her to get with that doc who got her assistance last time for the breztri and see if she can apply again.  she will see him next month.   Pt denies c/p. Denies dizziness. Sometimes she gets the fluttering in her chest, I caught it last visit, but took EKG and it had returned to normal.   Will get her refills called in on wed/thur this week. Family will p/u.   B/p very elevated today. Took meds around 1030.  I did get her to stand to check weight just a minute prior to this b/p check.  Checked bp again later-  No palpitations during check today. I didn't catch any during v/s taken.   Pt reports she ate toast for breakfast, last night she had potato salad, green beans and fried apples last night for dinner.  She reports her head bothered her last night and her head hurt this morning when she got up, she reports its gotten better but still bothersome a little bit.  According to her memory on her auto b/p cuff its been elevated the past few days.  Sent message to triage and secure chat ref b/p.   --heard back and provider wants to start imdur 30mg  daily.  I called pharmacy to go ahead and place refills on those meds listed above and a few of them they have sent needs approval from provider.    BP (!) 190/70   Pulse (!) 58   Resp 16   Wt 147 lb (66.7 kg)   SpO2 98%   BMI 28.71 kg/m  B/p 10 min ago- 190/70  Weight yesterday-148  Last visit weight-150  Patient Care  Team: [name], MD as PCP - General (Internal Medicine) Nahser, Deloris Ping, MD as PCP - Cardiology (Cardiology) August Saucer, Corrie Mckusick, MD (Orthopedic Surgery) Malachy Mood, MD as Consulting Physician (Hematology) Bufford Buttner, MD as Consulting Physician (Nephrology) Mateo Flow, MD as Consulting Physician (Ophthalmology) Eber Jones, MD as Referring Physician (Ophthalmology) Delton Coombes Les Pou, MD as Consulting Physician (Pulmonary Disease) Sherrill Raring, [facility] (Pharmacist)  Patient Active Problem List   Diagnosis Date Noted   CAP (community acquired pneumonia) 10/31/2022   Diastolic congestive heart failure (HCC) 10/31/2022   Hypertensive urgency 10/31/2022   Moderate persistent asthma 10/31/2022   Hypomagnesemia 10/31/2022   CKD (chronic kidney disease), stage IV (HCC) 10/31/2022   Pulmonary hypertension, unspecified (HCC) 12/06/2021   Chronic cough 10/18/2021   Chronic rhinitis 11/24/2019   Nausea 11/03/2018   Elevated troponin 11/03/2018   Hyponatremia 11/03/2018   Hypertensive heart disease with hypertensive chronic kidney disease 11/30/2017   Fasting hyperglycemia 11/30/2017   Renal cyst 03/27/2016   CKD (chronic kidney disease) stage 3, GFR 30-59 ml/min (HCC) 08/23/2015   Subjective hearing change 06/29/2014   Resistant hypertension 06/29/2014   Lumbago 06/15/2014   Pre-diabetes vs early DM  06/15/2014   Dyspnea 04/27/2014   Asthma, chronic 04/13/2014   Elevated uric acid in  blood 04/13/2014   Essential hypertension 03/27/2014   History of DVT (deep vein thrombosis)    Palpitations 01/05/2014   Anemia of chronic disease January 05, 2014   Death of family member 09/09/13   Leg edema 06/20/2013   Bereavement due to life event 04/21/2013   Other dysphagia 02/11/2013   Colon cancer screening 02/11/2013   Anxiety state 01/20/2013   Leg cramps 01/20/2013   Back pain 12/05/2012   Family hx of colon cancer 10/21/2012   Family hx of lung cancer 10/21/2012   Family  hx-breast malignancy 10/21/2012   Renal insufficiency creatinine 1.3 10/21/2012   Anemia, chronic disease 10/21/2012   GERD (gastroesophageal reflux disease) 09/08/2012   HTN (hypertension) 09/08/2012   Hyperlipidemia 09/08/2012   Varicose veins of leg with complications 12/05/2010    Current Outpatient Medications:    acetaminophen (TYLENOL) 500 MG tablet, Take 500 mg by mouth in the morning and at bedtime., Disp: , Rfl:    acetaminophen-codeine (TYLENOL #3) 300-30 MG tablet, Take 1 tablet by mouth every 8 (eight) hours as needed for moderate pain., Disp: 25 tablet, Rfl: 0   albuterol (PROVENTIL) (2.5 MG/3ML) 0.083% nebulizer solution, Take 3 mLs (2.5 mg total) by nebulization every 6 (six) hours as needed for shortness of breath or wheezing., Disp: 360 mL, Rfl: 6   albuterol (VENTOLIN HFA) 108 (90 Base) MCG/ACT inhaler, USE 2 PUFFS EVERY 6 HOURS AS NEEDED FOR WHEEZING., Disp: 8.5 g, Rfl: 3   amLODipine (NORVASC) 10 MG tablet, Take 1 tablet (10 mg total) by mouth daily. (Patient taking differently: Take 10 mg by mouth at bedtime.), Disp: 30 tablet, Rfl: 1   atorvastatin (LIPITOR) 40 MG tablet, Take 1 tablet (40 mg total) by mouth daily., Disp: 90 tablet, Rfl: 3   atropine 1 % ophthalmic solution, Place 1 drop into both eyes 2 (two) times daily., Disp: , Rfl:    Azelastine-Fluticasone 137-50 MCG/ACT SUSP, PLACE ONE SPRAY IN EACH NOSTRIL TWICE DAILY (IN THE MORNING AND AT BEDTIME), Disp: 23 g, Rfl: 6   Budeson-Glycopyrrol-Formoterol (BREZTRI AEROSPHERE) 160-9-4.8 MCG/ACT AERO, INHALE 2 PUFFS INTO THE LUNGS TWICE DAILY, IN THE MORNING & AT BEDTIME., Disp: 10.7 g, Rfl: 2   carvedilol (COREG) 12.5 MG tablet, Take 1 tablet (12.5 mg total) by mouth 2 (two) times daily with a meal., Disp: 60 tablet, Rfl: 1   cetirizine (ZYRTEC) 10 MG tablet, Take 10 mg by mouth daily as needed for allergies or rhinitis., Disp: , Rfl:    citalopram (CELEXA) 20 MG tablet, Take 1 tablet (20 mg total) by mouth every  evening., Disp: 90 tablet, Rfl: 0   clopidogrel (PLAVIX) 75 MG tablet, TAKE ONE TABLET BY MOUTH ONCE DAILY., Disp: 30 tablet, Rfl: 1   doxazosin (CARDURA) 4 MG tablet, Take 1 tablet (4 mg total) by mouth every 12 (twelve) hours., Disp: 60 tablet, Rfl: 1   ferrous sulfate 325 (65 FE) MG EC tablet, Take 1 tablet (325 mg total) by mouth daily with breakfast., Disp: 30 tablet, Rfl: 3   fluticasone (FLONASE) 50 MCG/ACT nasal spray, Place 2 sprays into both nostrils as needed for allergies or rhinitis., Disp: , Rfl:    folic acid (FOLVITE) 1 MG tablet, Take 1 tablet (1 mg total) by mouth daily., Disp: 30 tablet, Rfl: 1   hydrALAZINE (APRESOLINE) 100 MG tablet, Take 1 tablet (100 mg total) by mouth 3 (three) times daily., Disp: 270 tablet, Rfl: 0   LORazepam (ATIVAN) 0.5 MG tablet, TAKE ONE TABLET BY MOUTH TWICE DAILY  AS NEEDED FOR ANXIETY, Disp: 30 tablet, Rfl: 0   metolazone (ZAROXOLYN) 2.5 MG tablet, Take 1 tablet (2.5 mg total) by mouth once a week. Take every Friday, Disp: 15 tablet, Rfl: 3   montelukast (SINGULAIR) 10 MG tablet, Take 1 tablet (10 mg total) by mouth daily. (Patient taking differently: Take 10 mg by mouth at bedtime.), Disp: 90 tablet, Rfl: 0   pantoprazole (PROTONIX) 40 MG tablet, TAKE ONE TABLET BY MOUTH TWICE DAILY, Disp: 180 tablet, Rfl: 1   potassium chloride (KLOR-CON M) 10 MEQ tablet, Take 1 tablet (10 mEq total) by mouth daily. Take an extra 20 meq (two tablets) on Mondays (this is the day you take Metolazone) (Patient taking differently: Take 10 mEq by mouth daily. Take an extra 20 meq (two tablets) on Fridays (this is the day you take Metolazone)), Disp: 120 tablet, Rfl: 3   RESTASIS 0.05 % ophthalmic emulsion, Place 1 drop into both eyes 2 (two) times daily., Disp: , Rfl:    sodium bicarbonate 650 MG tablet, Take 1 tablet (650 mg total) by mouth 2 (two) times daily., Disp: 60 tablet, Rfl: 1   Spacer/Aero-Holding Chambers (AEROCHAMBER PLUS) inhaler, Use as instructed (Patient  taking differently: Use as instructed as needed), Disp: 1 each, Rfl: 2   vitamin B-12 (CYANOCOBALAMIN) 1000 MCG tablet, Take 1,000 mcg by mouth daily., Disp: , Rfl:    Vitamin D, Cholecalciferol, 25 MCG (1000 UT) TABS, Take 1,000 Units by mouth daily., Disp: , Rfl:    diclofenac Sodium (VOLTAREN) 1 % GEL, Apply 4 g topically 2 (two) times daily as needed (painful areas)., Disp: 50 g, Rfl: 1   furosemide (LASIX) 40 MG tablet, Take 1.5 tablets (60 mg total) by mouth 2 (two) times daily., Disp: 270 tablet, Rfl: 3   ondansetron (ZOFRAN-ODT) 4 MG disintegrating tablet, 4mg  ODT q4 hours prn nausea/vomit (Patient not taking: Reported on 04/02/2023), Disp: 20 tablet, Rfl: 0 Allergies  Allergen Reactions   Penicillins Nausea And Vomiting and Other (See Comments)    Has patient had a PCN reaction causing immediate rash, facial/tongue/throat swelling, SOB or lightheadedness with hypotension: Y Has patient had a PCN reaction causing severe rash involving mucus membranes or skin necrosis: Y Has patient had a PCN reaction that required hospitalization: N Has patient had a PCN reaction occurring within the last 10 years: N If all of the above answers are "NO", then may proceed with Cephalosporin use.    Aspirin Other (See Comments)    Wheezing Acetaminophen is OK       Social History   Socioeconomic History   Marital status: Single    Spouse name: Not on file   Number of children: 6   Years of education: Not on file   Highest education level: Not on file  Occupational History   Occupation: retired  Tobacco Use   Smoking status: Never   Smokeless tobacco: Never  Vaping Use   Vaping status: Never Used  Substance and Sexual Activity   Alcohol use: No   Drug use: No   Sexual activity: Not Currently    Birth control/protection: Surgical  Other Topics Concern   Not on file  Social History Narrative   2 people living in the home.  Grand daughter.   Up and down through the night      Had 7  children  2 deceased . 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    works 3 days per week currently .   Glasses dentures  Neg tad    Social Determinants of Health   Financial Resource Strain: Low Risk  (04/14/2022)   Overall Financial Resource Strain (CARDIA)    Difficulty of Paying Living Expenses: Not very hard  Food Insecurity: No Food Insecurity (12/01/2022)   Hunger Vital Sign    Worried About Running Out of Food in the Last Year: Never true    Ran Out of Food in the Last Year: Never true  Transportation Needs: No Transportation Needs (12/01/2022)   PRAPARE - Administrator, Civil Service (Medical): No    Lack of Transportation (Non-Medical): No  Physical Activity: Not on file  Stress: Not on file  Social Connections: Not on file  Intimate Partner Violence: Not At Risk (10/31/2022)   Humiliation, Afraid, Rape, and Kick questionnaire    Fear of Current or Ex-Partner: No    Emotionally Abused: No    Physically Abused: No    Sexually Abused: No    Physical Exam      Future Appointments  Date Time Provider Department Center  04/17/2023 11:30 AM Panosh, Neta Mends, MD [facility]  04/19/2023  1:45 PM Leslye Peer, MD [facility] None  04/24/2023 11:40 AM Dorthula Nettles, DO [facility] None  04/24/2023  1:00 PM [facility] None  04/24/2023  1:45 PM [facility] FLUSH [facility] None  05/22/2023  1:00 PM [facility] None  05/22/2023  1:45 PM [facility] FLUSH [facility] None  06/19/2023  1:00 PM [facility] None  06/19/2023  1:45 PM [facility] FLUSH [facility] None  07/17/2023  1:00 PM [facility] None  07/17/2023  1:30 PM Carlean Jews, NP [facility] None  07/17/2023  2:15 PM [facility] FLUSH [facility] None       Kerry Hough, Paramedic [phone] [facility] Paramedic  04/02/23

## 2023-04-02 NOTE — Telephone Encounter (Signed)
Message to Florentina Addison para medicine aware  Meds to pharmacy

## 2023-04-03 ENCOUNTER — Other Ambulatory Visit (HOSPITAL_COMMUNITY): Payer: Self-pay

## 2023-04-04 ENCOUNTER — Telehealth (HOSPITAL_COMMUNITY): Payer: Self-pay | Admitting: Licensed Clinical Social Worker

## 2023-04-04 ENCOUNTER — Other Ambulatory Visit: Payer: Self-pay | Admitting: Cardiovascular Disease

## 2023-04-04 NOTE — Progress Notes (Signed)
Pt got her isosorbide p/u from pharmacy.  She took one earlier this afternoon.  She c/o slight h/a, better than it was earlier.  Her home cuff readings elevated earlier also.   She has extreme b/p changes with little exertions.   Initially her b/p was 174/72   She had just sat down from opening the door.  After about recheck and it was b/p-142/62.   Rechecked on other arm and it was very close to same.  She denies dizziness, no c/p, no blurry vision. I placed isosorbide in pill box for rest of week.  Advised her to keep monitoring her b/p at home, we checked the machine and it was close to what I got.  And to let me know should it keep reading high.    Kerry Hough, EMT-Paramedic  [phone] 04-03-2023

## 2023-04-04 NOTE — Telephone Encounter (Signed)
HF Paramedicine Team Based Care Meeting  One Month Post Enrollment Assessment  HF MD- NA  HF NP - Amy Clegg NP-C   [facility] HF Paramedicine  Katie Vicente Males  [facility] admit within the last 30 days for heart failure? no  Medications concerns? Some recent changes to medications.  Paramedic filling pill box- patient had been filling incorrectly prior- working on education.  Can seem to identify pills but doesn't have a good system.  Compliant with taking meds.  Transportation issues ? no  SDOH concerns? Good family support, might qualify for medicaid?  Barriers to discharge? Newer patient still having med changes and needing help with filling med box. Will continue to follow for now.     Burna Sis, LCSW Clinical Social Worker [facility] Desk#: [phone] Cell#: [phone]

## 2023-04-06 ENCOUNTER — Other Ambulatory Visit: Payer: Self-pay | Admitting: Family

## 2023-04-06 MED ORDER — CARVEDILOL 12.5 MG PO TABS: 12.5000 mg | ORAL_TABLET | Freq: Two times a day (BID) | ORAL | 3 refills | Status: AC

## 2023-04-06 NOTE — Telephone Encounter (Signed)
Pt last seen here on 12/22/22 by Nahser:  Is on Coreg 12. 5 BID with HR of 61 Continue current medications for now  Pt to f/u w/HF clinic, but will renew until then.

## 2023-04-09 ENCOUNTER — Other Ambulatory Visit (HOSPITAL_COMMUNITY): Payer: Self-pay

## 2023-04-09 NOTE — Progress Notes (Signed)
Paramedicine Encounter    Patient ID: [name], female    DOB: 05/14/1934, 89 y.o.   MRN: [id number]   Complaints-c/o h/a off and on and fluttering in chest with sob some   Edema-yes  Compliance with meds-yes  Pill box filled-yes  If so, by whom-pill box   Refills needed- Sodium bicarb  folic acid  Carvedilol- called in last week and g-daughter will p/u and she will place in pill box  Pt reports she is doing ok. She has had h/a off and on the past few days.  She reports her breathing is doing ok. She reports she experienced some fluttering in her chest on Saturday and she did have some sob with it while sitting at rest watching tv that lasted approx an hour.  Last week her b/p were elevated and isosorbide was added. Her home b/p have been all over the place from 118 systolic to 160.   Sent message to triage ref her heart fluttering. None during our visit and I didn't hear/feel any upon assessment.    BP (!) 142/60   Pulse (!) 51   Resp 16   Wt 148 lb (67.1 kg)   SpO2 98%   BMI 28.90 kg/m  Weight yesterday-? Last visit weight-147   Patient Care Team: Panosh, Neta Mends, MD as PCP - General (Internal Medicine) Nahser, Deloris Ping, MD as PCP - Cardiology (Cardiology) August Saucer, Corrie Mckusick, MD (Orthopedic Surgery) Malachy Mood, MD as Consulting Physician (Hematology) Bufford Buttner, MD as Consulting Physician (Nephrology) Mateo Flow, MD as Consulting Physician (Ophthalmology) Eber Jones, MD as Referring Physician (Ophthalmology) Delton Coombes Les Pou, MD as Consulting Physician (Pulmonary Disease) Sherrill Raring, [facility] (Pharmacist)  Patient Active Problem List   Diagnosis Date Noted   CAP (community acquired pneumonia) 10/31/2022   Diastolic congestive heart failure (HCC) 10/31/2022   Hypertensive urgency 10/31/2022   Moderate persistent asthma 10/31/2022   Hypomagnesemia 10/31/2022   CKD (chronic kidney disease), stage IV (HCC) 10/31/2022   Pulmonary  hypertension, unspecified (HCC) 12/06/2021   Chronic cough 10/18/2021   Chronic rhinitis 11/24/2019   Nausea 11/03/2018   Elevated troponin 11/03/2018   Hyponatremia 11/03/2018   Hypertensive heart disease with hypertensive chronic kidney disease 11/30/2017   Fasting hyperglycemia 11/30/2017   Renal cyst 03/27/2016   CKD (chronic kidney disease) stage 3, GFR 30-59 ml/min (HCC) 08/23/2015   Subjective hearing change 06/29/2014   Resistant hypertension 06/29/2014   Lumbago 06/15/2014   Pre-diabetes vs early DM  06/15/2014   Dyspnea 04/27/2014   Asthma, chronic 04/13/2014   Elevated uric acid in blood 04/13/2014   Essential hypertension 03/27/2014   History of DVT (deep vein thrombosis)    Palpitations 01/05/2014   Anemia of chronic disease 2014-01-16   Death of family member 2013-09-20   Leg edema 06/20/2013   Bereavement due to life event 04/21/2013   Other dysphagia 02/11/2013   Colon cancer screening 02/11/2013   Anxiety state 01/20/2013   Leg cramps 01/20/2013   Back pain 12/05/2012   Family hx of colon cancer 10/21/2012   Family hx of lung cancer 10/21/2012   Family hx-breast malignancy 10/21/2012   Renal insufficiency creatinine 1.3 10/21/2012   Anemia, chronic disease 10/21/2012   GERD (gastroesophageal reflux disease) 09/08/2012   HTN (hypertension) 09/08/2012   Hyperlipidemia 09/08/2012   Varicose veins of leg with complications 12/05/2010    Current Outpatient Medications:    acetaminophen (TYLENOL) 500 MG tablet, Take 500 mg by mouth in the morning and  at bedtime., Disp: , Rfl:    acetaminophen-codeine (TYLENOL #3) 300-30 MG tablet, Take 1 tablet by mouth every 8 (eight) hours as needed for moderate pain., Disp: 25 tablet, Rfl: 0   albuterol (PROVENTIL) (2.5 MG/3ML) 0.083% nebulizer solution, Take 3 mLs (2.5 mg total) by nebulization every 6 (six) hours as needed for shortness of breath or wheezing., Disp: 360 mL, Rfl: 6   albuterol (VENTOLIN HFA) 108 (90 Base)  MCG/ACT inhaler, USE 2 PUFFS EVERY 6 HOURS AS NEEDED FOR WHEEZING., Disp: 8.5 g, Rfl: 3   amLODipine (NORVASC) 10 MG tablet, Take 1 tablet (10 mg total) by mouth daily. (Patient taking differently: Take 10 mg by mouth at bedtime.), Disp: 30 tablet, Rfl: 1   atorvastatin (LIPITOR) 40 MG tablet, Take 1 tablet (40 mg total) by mouth daily., Disp: 90 tablet, Rfl: 3   Azelastine-Fluticasone 137-50 MCG/ACT SUSP, PLACE ONE SPRAY IN EACH NOSTRIL TWICE DAILY (IN THE MORNING AND AT BEDTIME), Disp: 23 g, Rfl: 6   Budeson-Glycopyrrol-Formoterol (BREZTRI AEROSPHERE) 160-9-4.8 MCG/ACT AERO, INHALE 2 PUFFS INTO THE LUNGS TWICE DAILY, IN THE MORNING & AT BEDTIME., Disp: 10.7 g, Rfl: 2   carvedilol (COREG) 12.5 MG tablet, Take 1 tablet (12.5 mg total) by mouth 2 (two) times daily with a meal., Disp: 60 tablet, Rfl: 1   cetirizine (ZYRTEC) 10 MG tablet, Take 10 mg by mouth daily as needed for allergies or rhinitis., Disp: , Rfl:    citalopram (CELEXA) 20 MG tablet, Take 1 tablet (20 mg total) by mouth every evening., Disp: 90 tablet, Rfl: 0   clopidogrel (PLAVIX) 75 MG tablet, TAKE ONE TABLET BY MOUTH ONCE DAILY., Disp: 30 tablet, Rfl: 1   diclofenac Sodium (VOLTAREN) 1 % GEL, Apply 4 g topically 2 (two) times daily as needed (painful areas)., Disp: 50 g, Rfl: 1   doxazosin (CARDURA) 4 MG tablet, Take 1 tablet (4 mg total) by mouth every 12 (twelve) hours., Disp: 60 tablet, Rfl: 1   ferrous sulfate 325 (65 FE) MG EC tablet, Take 1 tablet (325 mg total) by mouth daily with breakfast., Disp: 30 tablet, Rfl: 3   folic acid (FOLVITE) 1 MG tablet, Take 1 tablet (1 mg total) by mouth daily., Disp: 30 tablet, Rfl: 1   furosemide (LASIX) 40 MG tablet, Take 1.5 tablets (60 mg total) by mouth 2 (two) times daily., Disp: 270 tablet, Rfl: 3   hydrALAZINE (APRESOLINE) 100 MG tablet, Take 1 tablet (100 mg total) by mouth 3 (three) times daily., Disp: 270 tablet, Rfl: 0   isosorbide mononitrate (IMDUR) 30 MG 24 hr tablet, Take 1  tablet (30 mg total) by mouth daily., Disp: 90 tablet, Rfl: 3   metolazone (ZAROXOLYN) 2.5 MG tablet, Take 1 tablet (2.5 mg total) by mouth once a week. Take every Friday, Disp: 15 tablet, Rfl: 3   montelukast (SINGULAIR) 10 MG tablet, Take 1 tablet (10 mg total) by mouth daily. (Patient taking differently: Take 10 mg by mouth at bedtime.), Disp: 90 tablet, Rfl: 0   pantoprazole (PROTONIX) 40 MG tablet, TAKE ONE TABLET BY MOUTH TWICE DAILY, Disp: 180 tablet, Rfl: 1   potassium chloride (KLOR-CON M) 10 MEQ tablet, Take 1 tablet (10 mEq total) by mouth daily. Take an extra 20 meq (two tablets) on Mondays (this is the day you take Metolazone) (Patient taking differently: Take 10 mEq by mouth daily. Take an extra 20 meq (two tablets) on Fridays (this is the day you take Metolazone)), Disp: 120 tablet, Rfl: 3  RESTASIS 0.05 % ophthalmic emulsion, Place 1 drop into both eyes 2 (two) times daily., Disp: , Rfl:    sodium bicarbonate 650 MG tablet, Take 1 tablet (650 mg total) by mouth 2 (two) times daily., Disp: 60 tablet, Rfl: 1   Spacer/Aero-Holding Chambers (AEROCHAMBER PLUS) inhaler, Use as instructed (Patient taking differently: Use as instructed as needed), Disp: 1 each, Rfl: 2   vitamin B-12 (CYANOCOBALAMIN) 1000 MCG tablet, Take 1,000 mcg by mouth daily., Disp: , Rfl:    Vitamin D, Cholecalciferol, 25 MCG (1000 UT) TABS, Take 1,000 Units by mouth daily., Disp: , Rfl:    atropine 1 % ophthalmic solution, Place 1 drop into both eyes 2 (two) times daily., Disp: , Rfl:    carvedilol (COREG) 12.5 MG tablet, Take 1 tablet (12.5 mg total) by mouth 2 (two) times daily. (Patient not taking: Reported on 04/09/2023), Disp: 180 tablet, Rfl: 3   fluticasone (FLONASE) 50 MCG/ACT nasal spray, Place 2 sprays into both nostrils as needed for allergies or rhinitis., Disp: , Rfl:    LORazepam (ATIVAN) 0.5 MG tablet, TAKE ONE TABLET BY MOUTH TWICE DAILY AS NEEDED FOR ANXIETY, Disp: 30 tablet, Rfl: 0   ondansetron  (ZOFRAN-ODT) 4 MG disintegrating tablet, 4mg  ODT q4 hours prn nausea/vomit (Patient not taking: Reported on 04/02/2023), Disp: 20 tablet, Rfl: 0 Allergies  Allergen Reactions   Penicillins Nausea And Vomiting and Other (See Comments)    Has patient had a PCN reaction causing immediate rash, facial/tongue/throat swelling, SOB or lightheadedness with hypotension: Y Has patient had a PCN reaction causing severe rash involving mucus membranes or skin necrosis: Y Has patient had a PCN reaction that required hospitalization: N Has patient had a PCN reaction occurring within the last 10 years: N If all of the above answers are "NO", then may proceed with Cephalosporin use.    Aspirin Other (See Comments)    Wheezing Acetaminophen is OK       Social History   Socioeconomic History   Marital status: Single    Spouse name: Not on file   Number of children: 6   Years of education: Not on file   Highest education level: Not on file  Occupational History   Occupation: retired  Tobacco Use   Smoking status: Never   Smokeless tobacco: Never  Vaping Use   Vaping status: Never Used  Substance and Sexual Activity   Alcohol use: No   Drug use: No   Sexual activity: Not Currently    Birth control/protection: Surgical  Other Topics Concern   Not on file  Social History Narrative   2 people living in the home.  Grand daughter.   Up and down through the night      Had 7 children  2 deceased . 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    works 3 days per week currently .   Glasses dentures  Neg tad    Social Determinants of Health   Financial Resource Strain: Low Risk  (04/14/2022)   Overall Financial Resource Strain (CARDIA)    Difficulty of Paying Living Expenses: Not very hard  Food Insecurity: No Food Insecurity (12/01/2022)   Hunger Vital Sign    Worried About Running Out of Food in the Last Year: Never true     Ran Out of Food in the Last Year: Never true  Transportation Needs: No Transportation Needs (12/01/2022)  PRAPARE - Administrator, Civil Service (Medical): No    Lack of Transportation (Non-Medical): No  Physical Activity: Not on file  Stress: Not on file  Social Connections: Not on file  Intimate Partner Violence: Not At Risk (10/31/2022)   Humiliation, Afraid, Rape, and Kick questionnaire    Fear of Current or Ex-Partner: No    Emotionally Abused: No    Physically Abused: No    Sexually Abused: No    Physical Exam      Future Appointments  Date Time Provider Department Center  04/17/2023 11:30 AM Panosh, Neta Mends, MD [facility]  04/19/2023  1:45 PM Leslye Peer, MD [facility] None  04/24/2023 11:40 AM Dorthula Nettles, DO [facility] None  04/24/2023  1:00 PM [facility] None  04/24/2023  1:45 PM [facility] FLUSH [facility] None  05/22/2023  1:00 PM [facility] None  05/22/2023  1:45 PM [facility] FLUSH [facility] None  06/19/2023  1:00 PM [facility] None  06/19/2023  1:45 PM [facility] FLUSH [facility] None  07/17/2023  1:00 PM [facility] None  07/17/2023  1:30 PM Carlean Jews, NP [facility] None  07/17/2023  2:15 PM [facility] FLUSH [facility] None       Kerry Hough, Paramedic [phone] [facility]  04/09/23

## 2023-04-10 ENCOUNTER — Other Ambulatory Visit (HOSPITAL_COMMUNITY): Payer: Self-pay | Admitting: Family Medicine

## 2023-04-11 ENCOUNTER — Ambulatory Visit: Payer: Medicare Other | Admitting: Internal Medicine

## 2023-04-11 ENCOUNTER — Other Ambulatory Visit: Payer: Self-pay

## 2023-04-11 MED ORDER — CARVEDILOL 12.5 MG PO TABS: 12.5000 mg | ORAL_TABLET | Freq: Two times a day (BID) | ORAL | 2 refills | Status: DC

## 2023-04-16 NOTE — Progress Notes (Unsigned)
No chief complaint on file.   HPI: [name] 89 y.o. come in for Chronic disease management    Cards for CHF Oncology for on going chornic anemia  and infusion of Darbepoetin alfa  11 12   CKD  renal and BP  ckd 4  Seen ed 10 29 for abd pain  Dr Juventino Slovak Deanfor  lumbar radiculopathy   ROS: See pertinent positives and negatives per HPI.  Past Medical History:  Diagnosis Date   Acute superficial venous thrombosis of left lower extremity 03/27/2014   concern about hx and potential extensive on exam tenderness calf and low dose lovenox 40 qd 2-4 weekselevetion and close  fu advised .    Allergy    Anemia    Anxiety    Arthritis    "shoulders" (09/09/2012)   Asthma 09/08/2012   Carotid bruit    [place] 2018  low risk 1 - 39%   Cataract    bil cateracts removed   Chronic kidney disease    Clotting disorder (HCC)    blood clots in legs   Diabetes mellitus without complication (HCC)    "Borderline" per pt; being monitored.  no meds per pt   Diverticulosis    Dyspnea 04/27/2014   GERD (gastroesophageal reflux disease)    Headache(784.0)    "related to my high blood pressure" (09/09/2012)   Hiatal hernia    History of DVT (deep vein thrombosis)    "RLE" (09/09/2012) coumadine cant take asa so on plavix    HTN (hypertension) 09/08/2012   Hyperlipidemia    Lupus    "cured years ago" (09/09/2012)   Other dysphagia 02/11/2013   Syncope and collapse 01/21/2018   Varicose veins    Varicose veins of leg with complications 12/05/2010    Family History  Problem Relation Age of Onset   Hypertension Mother    Lung cancer Mother    Hypertension Father    Lung cancer Father    Breast cancer Sister    Colon cancer Sister        ? 52' s dx - died in 1's   Breast cancer Sister    Hypertension Brother    Rectal cancer Brother    Stomach cancer Brother    Breast cancer Brother    Heart attack Daughter    Esophageal cancer Daughter    Hypertension Daughter    Diabetes  Daughter    Breast cancer Paternal Aunt    Lung cancer Other        both parents    Pancreatic cancer Neg Hx     Social History   Socioeconomic History   Marital status: Single    Spouse name: Not on file   Number of children: 6   Years of education: Not on file   Highest education level: Not on file  Occupational History   Occupation: retired  Tobacco Use   Smoking status: Never   Smokeless tobacco: Never  Vaping Use   Vaping status: Never Used  Substance and Sexual Activity   Alcohol use: No   Drug use: No   Sexual activity: Not Currently    Birth control/protection: Surgical  Other Topics Concern   Not on file  Social History Narrative   2 people living in the home.  Grand daughter.   Up and down through the night      Had 7 children  2 deceased . 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    works 3 days per week currently .   Glasses dentures  Neg tad    Social Determinants of Health   Financial Resource Strain: Low Risk  (04/14/2022)   Overall Financial Resource Strain (CARDIA)    Difficulty of Paying Living Expenses: Not very hard  Food Insecurity: No Food Insecurity (12/01/2022)   Hunger Vital Sign    Worried About Running Out of Food in the Last Year: Never true    Ran Out of Food in the Last Year: Never true  Transportation Needs: No Transportation Needs (12/01/2022)   PRAPARE - Administrator, Civil Service (Medical): No    Lack of Transportation (Non-Medical): No  Physical Activity: Not on file  Stress: Not on file  Social Connections: Not on file    Outpatient Medications Prior to Visit  Medication Sig Dispense Refill   acetaminophen (TYLENOL) 500 MG tablet Take 500 mg by mouth in the morning and at bedtime.     acetaminophen-codeine (TYLENOL #3) 300-30 MG tablet Take 1 tablet by mouth every 8 (eight) hours as needed for moderate pain. 25 tablet 0   albuterol  (PROVENTIL) (2.5 MG/3ML) 0.083% nebulizer solution Take 3 mLs (2.5 mg total) by nebulization every 6 (six) hours as needed for shortness of breath or wheezing. 360 mL 6   albuterol (VENTOLIN HFA) 108 (90 Base) MCG/ACT inhaler USE 2 PUFFS EVERY 6 HOURS AS NEEDED FOR WHEEZING. 8.5 g 3   amLODipine (NORVASC) 10 MG tablet Take 1 tablet (10 mg total) by mouth daily. (Patient taking differently: Take 10 mg by mouth at bedtime.) 30 tablet 1   atorvastatin (LIPITOR) 40 MG tablet Take 1 tablet (40 mg total) by mouth daily. 90 tablet 3   atropine 1 % ophthalmic solution Place 1 drop into both eyes 2 (two) times daily.     Azelastine-Fluticasone 137-50 MCG/ACT SUSP PLACE ONE SPRAY IN EACH NOSTRIL TWICE DAILY (IN THE MORNING AND AT BEDTIME) 23 g 6   Budeson-Glycopyrrol-Formoterol (BREZTRI AEROSPHERE) 160-9-4.8 MCG/ACT AERO INHALE 2 PUFFS INTO THE LUNGS TWICE DAILY, IN THE MORNING & AT BEDTIME. 10.7 g 2   carvedilol (COREG) 12.5 MG tablet Take 1 tablet (12.5 mg total) by mouth 2 (two) times daily. (Patient not taking: Reported on 04/09/2023) 180 tablet 3   carvedilol (COREG) 12.5 MG tablet Take 1 tablet (12.5 mg total) by mouth 2 (two) times daily with a meal. 180 tablet 2   cetirizine (ZYRTEC) 10 MG tablet Take 10 mg by mouth daily as needed for allergies or rhinitis.     citalopram (CELEXA) 20 MG tablet Take 1 tablet (20 mg total) by mouth every evening. 90 tablet 0   clopidogrel (PLAVIX) 75 MG tablet TAKE ONE TABLET BY MOUTH ONCE DAILY. 30 tablet 1   diclofenac Sodium (VOLTAREN) 1 % GEL Apply 4 g topically 2 (two) times daily as needed (painful areas). 50 g 1   doxazosin (CARDURA) 4 MG tablet take ONE tab every 12 hours 60 tablet 0   ferrous sulfate 325 (65 FE) MG EC tablet Take 1 tablet (325 mg total) by mouth daily with breakfast. 30 tablet 3   fluticasone (FLONASE) 50 MCG/ACT nasal spray Place 2 sprays into both nostrils as needed for allergies or rhinitis.     folic acid (FOLVITE) 1 MG tablet Take 1 tablet  (1 mg total) by mouth daily. 30 tablet 1   furosemide (LASIX) 40  MG tablet Take 1.5 tablets (60 mg total) by mouth 2 (two) times daily. 270 tablet 3   hydrALAZINE (APRESOLINE) 100 MG tablet Take 1 tablet (100 mg total) by mouth 3 (three) times daily. 270 tablet 0   isosorbide mononitrate (IMDUR) 30 MG 24 hr tablet Take 1 tablet (30 mg total) by mouth daily. 90 tablet 3   LORazepam (ATIVAN) 0.5 MG tablet TAKE ONE TABLET BY MOUTH TWICE DAILY AS NEEDED FOR ANXIETY 30 tablet 0   metolazone (ZAROXOLYN) 2.5 MG tablet Take 1 tablet (2.5 mg total) by mouth once a week. Take every Friday 15 tablet 3   montelukast (SINGULAIR) 10 MG tablet Take 1 tablet (10 mg total) by mouth daily. (Patient taking differently: Take 10 mg by mouth at bedtime.) 90 tablet 0   ondansetron (ZOFRAN-ODT) 4 MG disintegrating tablet 4mg  ODT q4 hours prn nausea/vomit (Patient not taking: Reported on 04/02/2023) 20 tablet 0   pantoprazole (PROTONIX) 40 MG tablet TAKE ONE TABLET BY MOUTH TWICE DAILY 180 tablet 1   potassium chloride (KLOR-CON M) 10 MEQ tablet Take 1 tablet (10 mEq total) by mouth daily. Take an extra 20 meq (two tablets) on Mondays (this is the day you take Metolazone) (Patient taking differently: Take 10 mEq by mouth daily. Take an extra 20 meq (two tablets) on Fridays (this is the day you take Metolazone)) 120 tablet 3   RESTASIS 0.05 % ophthalmic emulsion Place 1 drop into both eyes 2 (two) times daily.     sodium bicarbonate 650 MG tablet Take 1 tablet (650 mg total) by mouth 2 (two) times daily. 60 tablet 1   Spacer/Aero-Holding Chambers (AEROCHAMBER PLUS) inhaler Use as instructed (Patient taking differently: Use as instructed as needed) 1 each 2   vitamin B-12 (CYANOCOBALAMIN) 1000 MCG tablet Take 1,000 mcg by mouth daily.     Vitamin D, Cholecalciferol, 25 MCG (1000 UT) TABS Take 1,000 Units by mouth daily.     No facility-administered medications prior to visit.     EXAM:  There were no vitals taken for  this visit.  There is no height or weight on file to calculate BMI.  GENERAL: vitals reviewed and listed above, alert, oriented, appears well hydrated and in no acute distress HEENT: atraumatic, conjunctiva  clear, no obvious abnormalities on inspection of external nose and ears OP : no lesion edema or exudate  NECK: no obvious masses on inspection palpation  LUNGS: clear to auscultation bilaterally, no wheezes, rales or rhonchi, good air movement CV: HRRR, no clubbing cyanosis or  peripheral edema nl cap refill  MS: moves all extremities without noticeable focal  abnormality PSYCH: pleasant and cooperative, no obvious depression or anxiety Lab Results  Component Value Date   WBC 3.3 (L) 03/27/2023   HGB 10.7 (L) 03/27/2023   HCT 34.2 (L) 03/27/2023   PLT 136 (L) 03/27/2023   GLUCOSE 125 (H) 03/13/2023   CHOL 190 07/21/2022   TRIG 97 07/21/2022   HDL 92 07/21/2022   LDLCALC 81 07/21/2022   ALT 12 03/13/2023   AST 22 03/13/2023   NA 139 03/13/2023   K 3.6 03/13/2023   CL 103 03/13/2023   CREATININE 3.87 (H) 03/13/2023   BUN 66 (H) 03/13/2023   CO2 24 03/13/2023   TSH 2.85 06/29/2022   INR 1.0 10/31/2022   HGBA1C 5.3 06/29/2022   MICROALBUR 119 03/02/2021   BP Readings from Last 3 Encounters:  04/09/23 (!) 142/60  04/02/23 (!) 190/70  03/27/23 138/68  MPRESSION: 1.  Scattered bibasilar ground-glass consolidation, which may reflect multifocal infection/inflammation versus hypoventilatory change. 2. No acute intra-abdominal or intrapelvic process. 3. Small hiatal hernia and fat containing left inguinal hernia. 4.  Aortic Atherosclerosis (ICD10-I70.0).     Electronically Signed   By: Sharlet Salina M.D.   On: 03/13/2023 17:54    ASSESSMENT AND PLAN:  Discussed the following assessment and plan:  No diagnosis found.  -Patient advised to return or notify health care team  if  new concerns arise.  There are no Patient Instructions on file for this visit.   Neta Mends. Stefanny Pieri M.D.

## 2023-04-17 ENCOUNTER — Ambulatory Visit: Payer: Medicare Other | Admitting: Internal Medicine

## 2023-04-17 ENCOUNTER — Other Ambulatory Visit (HOSPITAL_COMMUNITY): Payer: Self-pay

## 2023-04-17 NOTE — Progress Notes (Unsigned)
Paramedicine Encounter    Patient ID: Teneal Shryock, female    DOB: February 04, 1934, 89 y.o.   MRN: [id number]   Complaints-slight h/a at times  Edema-yes to her legs, but looks ok   Compliance with meds-yes  Pill box filled-yes  If so, by whom-paramedic   Refills needed- Doxazosin-ready for p/u  Lasix -p/u on 11/18 --she has not found bottle yet-- Folic acid--out--none in pill box --waiting on doc to approve  Sodium bicarb--needs in sun and mon PM only -waiting on doc to approve   --called these in while I was here --    Pt reports she is doing ok. She has slight h/a at times.  She reports she gets h/a often and will take tylenol to help it and this feels like her usual h/a.  She did take her b/p pill-hydralazine only his morning around 1015.  She has not taken the rest of her pills yet.  She denies any palpitations since I seen her last time. Message to clinic was not answered. She sees dr Gasper Lloyd next week at clinic.  Pt reports her breathing is doing good. No dizziness, no c/p. Meds verified and pill box refilled.  She did have swelling to her legs but stable, weight is up 2 lbs--getting over thanksgiving holiday- advised her to put her compression stockings on.   BP (!) 178/70   Pulse (!) 58   Resp 14   Wt 150 lb (68 kg)   SpO2 99%   BMI 29.29 kg/m  Weight yesterday-? Last visit weight-148   Patient Care Team: Panosh, Neta Mends, MD as PCP - General (Internal Medicine) Nahser, Deloris Ping, MD as PCP - Cardiology (Cardiology) August Saucer, Corrie Mckusick, MD (Orthopedic Surgery) Malachy Mood, MD as Consulting Physician (Hematology) Bufford Buttner, MD as Consulting Physician (Nephrology) Mateo Flow, MD as Consulting Physician (Ophthalmology) Eber Jones, MD as Referring Physician (Ophthalmology) Delton Coombes Les Pou, MD as Consulting Physician (Pulmonary Disease) Sherrill Raring, [facility] (Pharmacist)  Patient Active Problem List   Diagnosis Date Noted   CAP (community  acquired pneumonia) 10/31/2022   Diastolic congestive heart failure (HCC) 10/31/2022   Hypertensive urgency 10/31/2022   Moderate persistent asthma 10/31/2022   Hypomagnesemia 10/31/2022   CKD (chronic kidney disease), stage IV (HCC) 10/31/2022   Pulmonary hypertension, unspecified (HCC) 12/06/2021   Chronic cough 10/18/2021   Chronic rhinitis 11/24/2019   Nausea 11/03/2018   Elevated troponin 11/03/2018   Hyponatremia 11/03/2018   Hypertensive heart disease with hypertensive chronic kidney disease 11/30/2017   Fasting hyperglycemia 11/30/2017   Renal cyst 03/27/2016   CKD (chronic kidney disease) stage 3, GFR 30-59 ml/min (HCC) 08/23/2015   Subjective hearing change 06/29/2014   Resistant hypertension 06/29/2014   Lumbago 06/15/2014   Pre-diabetes vs early DM  06/15/2014   Dyspnea 04/27/2014   Asthma, chronic 04/13/2014   Elevated uric acid in blood 04/13/2014   Essential hypertension 03/27/2014   History of DVT (deep vein thrombosis)    Palpitations 01/05/2014   Anemia of chronic disease 12/29/2013   Death of family member Sep 02, 2013   Leg edema 06/20/2013   Bereavement due to life event 04/21/2013   Other dysphagia 02/11/2013   Colon cancer screening 02/11/2013   Anxiety state 01/20/2013   Leg cramps 01/20/2013   Back pain 12/05/2012   Family hx of colon cancer 10/21/2012   Family hx of lung cancer 10/21/2012   Family hx-breast malignancy 10/21/2012   Renal insufficiency creatinine 1.3 10/21/2012   Anemia, chronic disease 10/21/2012  GERD (gastroesophageal reflux disease) 09/08/2012   HTN (hypertension) 09/08/2012   Hyperlipidemia 09/08/2012   Varicose veins of leg with complications 12/05/2010    Current Outpatient Medications:    acetaminophen (TYLENOL) 500 MG tablet, Take 500 mg by mouth in the morning and at bedtime., Disp: , Rfl:    acetaminophen-codeine (TYLENOL #3) 300-30 MG tablet, Take 1 tablet by mouth every 8 (eight) hours as needed for moderate pain.,  Disp: 25 tablet, Rfl: 0   albuterol (PROVENTIL) (2.5 MG/3ML) 0.083% nebulizer solution, Take 3 mLs (2.5 mg total) by nebulization every 6 (six) hours as needed for shortness of breath or wheezing., Disp: 360 mL, Rfl: 6   albuterol (VENTOLIN HFA) 108 (90 Base) MCG/ACT inhaler, USE 2 PUFFS EVERY 6 HOURS AS NEEDED FOR WHEEZING., Disp: 8.5 g, Rfl: 3   amLODipine (NORVASC) 10 MG tablet, Take 1 tablet (10 mg total) by mouth daily. (Patient taking differently: Take 10 mg by mouth at bedtime.), Disp: 30 tablet, Rfl: 1   atorvastatin (LIPITOR) 40 MG tablet, Take 1 tablet (40 mg total) by mouth daily., Disp: 90 tablet, Rfl: 3   Azelastine-Fluticasone 137-50 MCG/ACT SUSP, PLACE ONE SPRAY IN EACH NOSTRIL TWICE DAILY (IN THE MORNING AND AT BEDTIME), Disp: 23 g, Rfl: 6   Budeson-Glycopyrrol-Formoterol (BREZTRI AEROSPHERE) 160-9-4.8 MCG/ACT AERO, INHALE 2 PUFFS INTO THE LUNGS TWICE DAILY, IN THE MORNING & AT BEDTIME., Disp: 10.7 g, Rfl: 2   carvedilol (COREG) 12.5 MG tablet, Take 1 tablet (12.5 mg total) by mouth 2 (two) times daily with a meal., Disp: 180 tablet, Rfl: 2   cetirizine (ZYRTEC) 10 MG tablet, Take 10 mg by mouth daily as needed for allergies or rhinitis., Disp: , Rfl:    citalopram (CELEXA) 20 MG tablet, Take 1 tablet (20 mg total) by mouth every evening., Disp: 90 tablet, Rfl: 0   doxazosin (CARDURA) 4 MG tablet, take ONE tab every 12 hours, Disp: 60 tablet, Rfl: 0   ferrous sulfate 325 (65 FE) MG EC tablet, Take 1 tablet (325 mg total) by mouth daily with breakfast., Disp: 30 tablet, Rfl: 3   fluticasone (FLONASE) 50 MCG/ACT nasal spray, Place 2 sprays into both nostrils as needed for allergies or rhinitis., Disp: , Rfl:    folic acid (FOLVITE) 1 MG tablet, Take 1 tablet (1 mg total) by mouth daily., Disp: 30 tablet, Rfl: 1   furosemide (LASIX) 40 MG tablet, Take 1.5 tablets (60 mg total) by mouth 2 (two) times daily., Disp: 270 tablet, Rfl: 3   hydrALAZINE (APRESOLINE) 100 MG tablet, Take 1 tablet  (100 mg total) by mouth 3 (three) times daily., Disp: 270 tablet, Rfl: 0   isosorbide mononitrate (IMDUR) 30 MG 24 hr tablet, Take 1 tablet (30 mg total) by mouth daily., Disp: 90 tablet, Rfl: 3   metolazone (ZAROXOLYN) 2.5 MG tablet, Take 1 tablet (2.5 mg total) by mouth once a week. Take every Friday, Disp: 15 tablet, Rfl: 3   montelukast (SINGULAIR) 10 MG tablet, Take 1 tablet (10 mg total) by mouth daily. (Patient taking differently: Take 10 mg by mouth at bedtime.), Disp: 90 tablet, Rfl: 0   pantoprazole (PROTONIX) 40 MG tablet, TAKE ONE TABLET BY MOUTH TWICE DAILY, Disp: 180 tablet, Rfl: 1   potassium chloride (KLOR-CON M) 10 MEQ tablet, Take 1 tablet (10 mEq total) by mouth daily. Take an extra 20 meq (two tablets) on Mondays (this is the day you take Metolazone) (Patient taking differently: Take 10 mEq by mouth daily. Take an extra  20 meq (two tablets) on Fridays (this is the day you take Metolazone)), Disp: 120 tablet, Rfl: 3   sodium bicarbonate 650 MG tablet, Take 1 tablet (650 mg total) by mouth 2 (two) times daily., Disp: 60 tablet, Rfl: 1   vitamin B-12 (CYANOCOBALAMIN) 1000 MCG tablet, Take 1,000 mcg by mouth daily., Disp: , Rfl:    Vitamin D, Cholecalciferol, 25 MCG (1000 UT) TABS, Take 1,000 Units by mouth daily., Disp: , Rfl:    atropine 1 % ophthalmic solution, Place 1 drop into both eyes 2 (two) times daily., Disp: , Rfl:    carvedilol (COREG) 12.5 MG tablet, Take 1 tablet (12.5 mg total) by mouth 2 (two) times daily. (Patient not taking: Reported on 04/09/2023), Disp: 180 tablet, Rfl: 3   clopidogrel (PLAVIX) 75 MG tablet, TAKE ONE TABLET BY MOUTH ONCE DAILY., Disp: 30 tablet, Rfl: 1   diclofenac Sodium (VOLTAREN) 1 % GEL, Apply 4 g topically 2 (two) times daily as needed (painful areas)., Disp: 50 g, Rfl: 1   LORazepam (ATIVAN) 0.5 MG tablet, TAKE ONE TABLET BY MOUTH TWICE DAILY AS NEEDED FOR ANXIETY, Disp: 30 tablet, Rfl: 0   ondansetron (ZOFRAN-ODT) 4 MG disintegrating tablet,  4mg  ODT q4 hours prn nausea/vomit (Patient not taking: Reported on 04/02/2023), Disp: 20 tablet, Rfl: 0   RESTASIS 0.05 % ophthalmic emulsion, Place 1 drop into both eyes 2 (two) times daily., Disp: , Rfl:    Spacer/Aero-Holding Chambers (AEROCHAMBER PLUS) inhaler, Use as instructed (Patient taking differently: Use as instructed as needed), Disp: 1 each, Rfl: 2 Allergies  Allergen Reactions   Penicillins Nausea And Vomiting and Other (See Comments)    Has patient had a PCN reaction causing immediate rash, facial/tongue/throat swelling, SOB or lightheadedness with hypotension: Y Has patient had a PCN reaction causing severe rash involving mucus membranes or skin necrosis: Y Has patient had a PCN reaction that required hospitalization: N Has patient had a PCN reaction occurring within the last 10 years: N If all of the above answers are "NO", then may proceed with Cephalosporin use.    Aspirin Other (See Comments)    Wheezing Acetaminophen is OK       Social History   Socioeconomic History   Marital status: Single    Spouse name: Not on file   Number of children: 6   Years of education: Not on file   Highest education level: Not on file  Occupational History   Occupation: retired  Tobacco Use   Smoking status: Never   Smokeless tobacco: Never  Vaping Use   Vaping status: Never Used  Substance and Sexual Activity   Alcohol use: No   Drug use: No   Sexual activity: Not Currently    Birth control/protection: Surgical  Other Topics Concern   Not on file  Social History Narrative   2 people living in the home.  Grand daughter.   Up and down through the night      Had 7 children  2 deceased . 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    works 3 days per week currently .   Glasses dentures  Neg tad    Social Determinants of Health   Financial Resource Strain: Low Risk  (04/14/2022)   Overall  Financial Resource Strain (CARDIA)    Difficulty of Paying Living Expenses: Not very hard  Food Insecurity: No Food Insecurity (12/01/2022)  Hunger Vital Sign    Worried About Running Out of Food in the Last Year: Never true    Ran Out of Food in the Last Year: Never true  Transportation Needs: No Transportation Needs (12/01/2022)   PRAPARE - Administrator, Civil Service (Medical): No    Lack of Transportation (Non-Medical): No  Physical Activity: Not on file  Stress: Not on file  Social Connections: Not on file  Intimate Partner Violence: Not At Risk (10/31/2022)   Humiliation, Afraid, Rape, and Kick questionnaire    Fear of Current or Ex-Partner: No    Emotionally Abused: No    Physically Abused: No    Sexually Abused: No    Physical Exam      Future Appointments  Date Time Provider Department Center  04/19/2023  1:45 PM Leslye Peer, MD [facility] None  04/24/2023 11:40 AM Sabharwal, Aditya, DO [facility] None  04/24/2023  1:00 PM [facility] None  04/24/2023  1:45 PM [facility] FLUSH [facility] None  05/22/2023  1:00 PM [facility] None  05/22/2023  1:45 PM [facility] FLUSH [facility] None  06/19/2023  1:00 PM [facility] None  06/19/2023  1:45 PM [facility] FLUSH [facility] None  07/17/2023  1:00 PM [facility] None  07/17/2023  1:30 PM Carlean Jews, NP [facility] None  07/17/2023  2:15 PM [facility] FLUSH [facility] None       Kerry Hough, Paramedic [phone] [facility] Paramedic  04/18/23

## 2023-04-18 DIAGNOSIS — N185 Chronic kidney disease, stage 5: Secondary | ICD-10-CM | POA: Diagnosis not present

## 2023-04-18 DIAGNOSIS — D631 Anemia in chronic kidney disease: Secondary | ICD-10-CM | POA: Diagnosis not present

## 2023-04-18 DIAGNOSIS — I120 Hypertensive chronic kidney disease with stage 5 chronic kidney disease or end stage renal disease: Secondary | ICD-10-CM | POA: Diagnosis not present

## 2023-04-18 DIAGNOSIS — N189 Chronic kidney disease, unspecified: Secondary | ICD-10-CM | POA: Diagnosis not present

## 2023-04-18 DIAGNOSIS — I8393 Asymptomatic varicose veins of bilateral lower extremities: Secondary | ICD-10-CM | POA: Diagnosis not present

## 2023-04-18 DIAGNOSIS — M109 Gout, unspecified: Secondary | ICD-10-CM | POA: Diagnosis not present

## 2023-04-18 DIAGNOSIS — N2581 Secondary hyperparathyroidism of renal origin: Secondary | ICD-10-CM | POA: Diagnosis not present

## 2023-04-19 ENCOUNTER — Encounter: Payer: Self-pay | Admitting: Emergency Medicine

## 2023-04-19 ENCOUNTER — Ambulatory Visit: Payer: Medicare Other | Admitting: Emergency Medicine

## 2023-04-19 ENCOUNTER — Other Ambulatory Visit: Payer: Self-pay | Admitting: Internal Medicine

## 2023-04-19 VITALS — BP 130/60 | HR 67 | Temp 98.1°F | Ht 64.0 in | Wt 153.0 lb

## 2023-04-19 DIAGNOSIS — K219 Gastro-esophageal reflux disease without esophagitis: Secondary | ICD-10-CM

## 2023-04-19 DIAGNOSIS — J310 Chronic rhinitis: Secondary | ICD-10-CM

## 2023-04-19 DIAGNOSIS — J45909 Unspecified asthma, uncomplicated: Secondary | ICD-10-CM

## 2023-04-19 LAB — LAB REPORT - SCANNED: EGFR: 10

## 2023-04-19 MED ORDER — MONTELUKAST SODIUM 10 MG PO TABS: 10.0000 mg | ORAL_TABLET | Freq: Every day | ORAL | 3 refills | Status: AC

## 2023-04-19 MED ORDER — PANTOPRAZOLE SODIUM 40 MG PO TBEC: 40.0000 mg | DELAYED_RELEASE_TABLET | Freq: Every day | ORAL | 3 refills | Status: DC

## 2023-04-19 MED ORDER — ALBUTEROL SULFATE HFA 108 (90 BASE) MCG/ACT IN AERS: INHALATION_SPRAY | RESPIRATORY_TRACT | 3 refills | Status: DC

## 2023-04-19 MED ORDER — BREZTRI AEROSPHERE 160-9-4.8 MCG/ACT IN AERO: INHALATION_SPRAY | RESPIRATORY_TRACT | 11 refills | Status: AC

## 2023-04-19 NOTE — Assessment & Plan Note (Signed)
We will restart your pantoprazole (Protonix) 40 mg once daily.

## 2023-04-19 NOTE — Progress Notes (Signed)
   Subjective:    Patient ID: [name], female    DOB: 12-23-1933, 89 y.o.   MRN: [id number]  HPI  [name] 04/19/2023 --follow-up visit for 89 year old woman for moderate persistent asthma/COPD, chronic cough and upper airway instability, secondary PAH.  She also has a history of diabetes and lower extremity DVT, hypertension, question SLE, GERD and allergic rhinitis. We have been managing her on Breztri, Protonix, Singulair, Dymista or flonase prn, zyrtec prn, although she is now off the PPI and the Singulair (ran out).  She had flaring symptoms including cough and wheezing in March 2024 and required prescription for prednisone. She was hospitalized w parainfluenza and CAP in June.  Today she reports that she is improved. She is not using albuterol every day. Does believe that she is benefiting from the [place]. She has intermittent cough and wheeze.    Review of Systems As per Hpi     Objective:   Physical Exam Vitals:   04/19/23 1409  BP: 130/60  Pulse: 67  Temp: 98.1 F (36.7 C)  TempSrc: Oral  SpO2: 100%  Weight: 153 lb (69.4 kg)  Height: 5\' 4"  (1.626 m)    Gen: Pleasant, overweight, in no distress,  normal affect  ENT: No lesions,  mouth clear,  oropharynx clear, no postnasal drip  Neck: No JVD, strong voice.  Very subtle end expiratory stridor  Lungs: No use of accessory muscles, no crackles or wheezing on normal respiration  Cardiovascular: RRR, heart sounds normal, no murmur or gallops, no peripheral edema  Musculoskeletal: No deformities, no cyanosis or clubbing  Neuro: alert, awake, non focal  Skin: Warm, no lesions or rash       Assessment & Plan:  Asthma, chronic Asthma with COPD.  She has had 2 flares this year, 1 in the setting of parainfluenza and CAP.  She is improved, is back on [place].  Intermittent albuterol use and symptoms.  Her GERD, allergic rhinitis are only marginally controlled and we will work on this.  Please continue your Breztri 2  puffs twice a day.  Rinse and gargle after using. Keep albuterol available to use 2 puffs up to every 4 hours if needed for shortness of breath, chest tightness, wheezing.  Flu shot up-to-date Follow with Dr. Delton Coombes in 12 months or sooner if you have any problems.   Chronic rhinitis We will restart your Singulair (montelukast) 10 mg each evening.  Will send a prescription for this. You should consider getting back on your allergy nasal spray and Zyrtec if you begin to have increased congestion, drainage contributing to cough or wheezing.  GERD (gastroesophageal reflux disease) We will restart your pantoprazole (Protonix) 40 mg once daily.   Levy Pupa, MD, PhD 04/19/2023, 2:30 PM [facility] [phone] or if no answer [phone]

## 2023-04-19 NOTE — Assessment & Plan Note (Signed)
Asthma with COPD.  She has had 2 flares this year, 1 in the setting of parainfluenza and CAP.  She is improved, is back on [place].  Intermittent albuterol use and symptoms.  Her GERD, allergic rhinitis are only marginally controlled and we will work on this.  Please continue your Breztri 2 puffs twice a day.  Rinse and gargle after using. Keep albuterol available to use 2 puffs up to every 4 hours if needed for shortness of breath, chest tightness, wheezing.  Flu shot up-to-date Follow with Dr. Delton Coombes in 12 months or sooner if you have any problems.

## 2023-04-19 NOTE — Assessment & Plan Note (Signed)
We will restart your Singulair (montelukast) 10 mg each evening.  Will send a prescription for this. You should consider getting back on your allergy nasal spray and Zyrtec if you begin to have increased congestion, drainage contributing to cough or wheezing.

## 2023-04-19 NOTE — Addendum Note (Signed)
Addended by: Delrae Rend on: 04/19/2023 02:36 PM   Modules accepted: Orders

## 2023-04-19 NOTE — Patient Instructions (Addendum)
Please continue your Breztri 2 puffs twice a day.  Rinse and gargle after using. Keep albuterol available to use 2 puffs up to every 4 hours if needed for shortness of breath, chest tightness, wheezing.  We will restart your Singulair (montelukast) 10 mg each evening.  Will send a prescription for this. You should consider getting back on your allergy nasal spray and Zyrtec if you begin to have increased congestion, drainage contributing to cough or wheezing. We will restart your pantoprazole (Protonix) 40 mg once daily. Flu shot up-to-date Follow with Dr. Delton Coombes in 12 months or sooner if you have any problems.

## 2023-04-23 ENCOUNTER — Other Ambulatory Visit: Payer: Self-pay | Admitting: Family

## 2023-04-23 NOTE — Progress Notes (Signed)
ADVANCED HEART FAILURE CLINIC NOTE  Referring Physician: [name], MD  Primary Care: [name], MD Primary Cardiologist: Sherilyn Banker, MD  HPI: [name] is a 89 y.o. female with heart failure with preserved ejection fraction, moderate persistent asthma, anemia of chronic disease followed by hematology, CKD 4, hyperlipidemia, hypertension presenting today for evaluation of pulmonary hypertension and persistent volume overload secondary to chronic HFpEF and underlying CKD.  During our last appt, patient was severely volume overloaded requiring brief course of escalated doses of furosemide with metolazone. Since that time she has had significant improvement in LE edema. She is unable to provide me with much insight regarding her life at home; however, appears to be fairly inactive due to frailty and deconditioning. Performs all ADLs independently & reports improvement in dyspnea on exertion.   Activity level/exercise tolerance:  NYHA III, limited by dyspnea and frailty.  Orthopnea:  Sleeps on 3 pillows Paroxysmal noctural dyspnea:  Improved. Chest pain/pressure:  no Orthostatic lightheadedness:  no Palpitations:  no Lower extremity edema:  1+ to knees; 2+ around ankles.  Presyncope/syncope: no Cough:  no  Past Medical History:  Diagnosis Date   Acute superficial venous thrombosis of left lower extremity 03/27/2014   concern about hx and potential extensive on exam tenderness calf and low dose lovenox 40 qd 2-4 weekselevetion and close  fu advised .    Allergy    Anemia    Anxiety    Arthritis    "shoulders" (09/09/2012)   Asthma 09/08/2012   Carotid bruit    [place] 2018  low risk 1 - 39%   Cataract    bil cateracts removed   Chronic kidney disease    Clotting disorder (HCC)    blood clots in legs   Diabetes mellitus without complication (HCC)    "Borderline" per pt; being monitored.  no meds per pt   Diverticulosis    Dyspnea 04/27/2014   GERD  (gastroesophageal reflux disease)    Headache(784.0)    "related to my high blood pressure" (09/09/2012)   Hiatal hernia    History of DVT (deep vein thrombosis)    "RLE" (09/09/2012) coumadine cant take asa so on plavix    HTN (hypertension) 09/08/2012   Hyperlipidemia    Lupus    "cured years ago" (09/09/2012)   Other dysphagia 02/11/2013   Syncope and collapse 01/21/2018   Varicose veins    Varicose veins of leg with complications 12/05/2010    Current Outpatient Medications  Medication Sig Dispense Refill   acetaminophen (TYLENOL) 500 MG tablet Take 500 mg by mouth in the morning and at bedtime.     acetaminophen-codeine (TYLENOL #3) 300-30 MG tablet Take 1 tablet by mouth every 8 (eight) hours as needed for moderate pain. 25 tablet 0   albuterol (PROVENTIL) (2.5 MG/3ML) 0.083% nebulizer solution Take 3 mLs (2.5 mg total) by nebulization every 6 (six) hours as needed for shortness of breath or wheezing. 360 mL 6   albuterol (VENTOLIN HFA) 108 (90 Base) MCG/ACT inhaler USE 2 PUFFS EVERY 6 HOURS AS NEEDED FOR WHEEZING. 8.5 g 3   amLODipine (NORVASC) 10 MG tablet Take 1 tablet (10 mg total) by mouth daily. (Patient taking differently: Take 10 mg by mouth at bedtime.) 30 tablet 1   atorvastatin (LIPITOR) 40 MG tablet Take 1 tablet (40 mg total) by mouth daily. 90 tablet 3   atropine 1 % ophthalmic solution Place 1 drop into both eyes 2 (two) times daily.  Azelastine-Fluticasone 137-50 MCG/ACT SUSP PLACE ONE SPRAY IN EACH NOSTRIL TWICE DAILY (IN THE MORNING AND AT BEDTIME) 23 g 6   Budeson-Glycopyrrol-Formoterol (BREZTRI AEROSPHERE) 160-9-4.8 MCG/ACT AERO INHALE 2 PUFFS INTO THE LUNGS TWICE DAILY, IN THE MORNING & AT BEDTIME. 10.7 g 11   carvedilol (COREG) 12.5 MG tablet Take 1 tablet (12.5 mg total) by mouth 2 (two) times daily. 180 tablet 3   cetirizine (ZYRTEC) 10 MG tablet Take 10 mg by mouth daily as needed for allergies or rhinitis.     citalopram (CELEXA) 20 MG tablet Take 1 tablet  (20 mg total) by mouth every evening. 90 tablet 0   clopidogrel (PLAVIX) 75 MG tablet TAKE ONE TABLET BY MOUTH ONCE DAILY. 30 tablet 1   diclofenac Sodium (VOLTAREN) 1 % GEL Apply 4 g topically 2 (two) times daily as needed (painful areas). 50 g 1   doxazosin (CARDURA) 4 MG tablet take ONE tab every 12 hours 60 tablet 0   ferrous sulfate 325 (65 FE) MG EC tablet Take 1 tablet (325 mg total) by mouth daily with breakfast. 30 tablet 3   fluticasone (FLONASE) 50 MCG/ACT nasal spray Place 2 sprays into both nostrils as needed for allergies or rhinitis.     folic acid (FOLVITE) 1 MG tablet take ONE tab daily 30 tablet 0   furosemide (LASIX) 40 MG tablet Take 1.5 tablets (60 mg total) by mouth 2 (two) times daily. 270 tablet 3   hydrALAZINE (APRESOLINE) 100 MG tablet Take 1 tablet (100 mg total) by mouth 3 (three) times daily. 270 tablet 0   isosorbide mononitrate (IMDUR) 30 MG 24 hr tablet Take 1 tablet (30 mg total) by mouth daily. 90 tablet 3   LORazepam (ATIVAN) 0.5 MG tablet TAKE ONE TABLET BY MOUTH TWICE DAILY AS NEEDED FOR ANXIETY 30 tablet 0   metolazone (ZAROXOLYN) 2.5 MG tablet Take 1 tablet (2.5 mg total) by mouth once a week. Take every Friday 15 tablet 3   montelukast (SINGULAIR) 10 MG tablet Take 1 tablet (10 mg total) by mouth daily. 90 tablet 3   pantoprazole (PROTONIX) 40 MG tablet Take 1 tablet (40 mg total) by mouth daily. 90 tablet 3   potassium chloride (KLOR-CON M) 10 MEQ tablet Take 10 mEq by mouth daily. Take an extra 20 meq (two tablets) on Fridays (this is the day you take Metolazone)     RESTASIS 0.05 % ophthalmic emulsion Place 1 drop into both eyes 2 (two) times daily.     sodium bicarbonate 650 MG tablet take ONE tab twice a DAY 60 tablet 0   Spacer/Aero-Holding Chambers (AEROCHAMBER PLUS) inhaler Use as instructed (Patient taking differently: Use as instructed as needed) 1 each 2   vitamin B-12 (CYANOCOBALAMIN) 1000 MCG tablet Take 1,000 mcg by mouth daily.     Vitamin D,  Cholecalciferol, 25 MCG (1000 UT) TABS Take 1,000 Units by mouth daily.     No current facility-administered medications for this encounter.    Allergies  Allergen Reactions   Penicillins Nausea And Vomiting and Other (See Comments)    Has patient had a PCN reaction causing immediate rash, facial/tongue/throat swelling, SOB or lightheadedness with hypotension: Y Has patient had a PCN reaction causing severe rash involving mucus membranes or skin necrosis: Y Has patient had a PCN reaction that required hospitalization: N Has patient had a PCN reaction occurring within the last 10 years: N If all of the above answers are "NO", then may proceed with Cephalosporin use.  Aspirin Other (See Comments)    Wheezing Acetaminophen is OK       Social History   Socioeconomic History   Marital status: Single    Spouse name: Not on file   Number of children: 6   Years of education: Not on file   Highest education level: Not on file  Occupational History   Occupation: retired  Tobacco Use   Smoking status: Never   Smokeless tobacco: Never  Vaping Use   Vaping status: Never Used  Substance and Sexual Activity   Alcohol use: No   Drug use: No   Sexual activity: Not Currently    Birth control/protection: Surgical  Other Topics Concern   Not on file  Social History Narrative   2 people living in the home.  Grand daughter.   Up and down through the night      Had 7 children  2 deceased . 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    works 3 days per week currently .   Glasses dentures  Neg tad    Social Determinants of Health   Financial Resource Strain: Low Risk  (04/14/2022)   Overall Financial Resource Strain (CARDIA)    Difficulty of Paying Living Expenses: Not very hard  Food Insecurity: No Food Insecurity (12/01/2022)   Hunger Vital Sign    Worried About Running Out of Food in the Last Year: Never  true    Ran Out of Food in the Last Year: Never true  Transportation Needs: No Transportation Needs (12/01/2022)   PRAPARE - Administrator, Civil Service (Medical): No    Lack of Transportation (Non-Medical): No  Physical Activity: Not on file  Stress: Not on file  Social Connections: Not on file  Intimate Partner Violence: Not At Risk (10/31/2022)   Humiliation, Afraid, Rape, and Kick questionnaire    Fear of Current or Ex-Partner: No    Emotionally Abused: No    Physically Abused: No    Sexually Abused: No      Family History  Problem Relation Age of Onset   Hypertension Mother    Lung cancer Mother    Hypertension Father    Lung cancer Father    Breast cancer Sister    Colon cancer Sister        ? 60' s dx - died in 27's   Breast cancer Sister    Hypertension Brother    Rectal cancer Brother    Stomach cancer Brother    Breast cancer Brother    Heart attack Daughter    Esophageal cancer Daughter    Hypertension Daughter    Diabetes Daughter    Breast cancer Paternal Aunt    Lung cancer Other        both parents    Pancreatic cancer Neg Hx     PHYSICAL EXAM: Vitals:   04/24/23 1159  BP: (!) 164/78  Pulse: (!) 54  SpO2: 98%   GENERAL: elderly AAF in NAD HEENT: Negative for arcus senilis or xanthelasma. There is no scleral icterus.  The mucous membranes are pink and moist.   NECK: Supple, No masses. Normal carotid upstrokes without bruits. No masses or thyromegaly.    CHEST: There are no chest wall deformities. There is no chest wall tenderness. Respirations are unlabored.  Lungs- CTA B/L; noc rackles CARDIAC:  JVP: 8 cm  Normal rate with regular rhythm. No murmurs, rubs or gallops.  Pulses are 2+ and symmetrical in upper and lower extremities. 1+  edema to knees; more prominent around ankles.  ABDOMEN: Soft, non-tender, non-distended. There are no masses or hepatomegaly. There are normal bowel sounds.  EXTREMITIES: Warm and well perfused  with no cyanosis, clubbing.  LYMPHATIC: No axillary or supraclavicular lymphadenopathy.  NEUROLOGIC: Patient is oriented x3 with no focal or lateralizing neurologic deficits.  PSYCH: Patients affect is appropriate, there is no evidence of anxiety or depression.  SKIN: Warm and dry; no lesions or wounds.    DATA REVIEW  ECG: 10/31/22: NSR, borderline low voltage limb leads as per my read  ECHO: 10/31/22: LVEF 60%, normal RV function. Moderate LVH, PASP as per my personal interpretation 11/14/18: LVEF 60-65%, normal RV function; PASP .    ASSESSMENT & PLAN:  Heart failure with preserved EF - TTE w/ moderate concentric LVH; based on chart review she has history of bilateral carpal tunnel release in 2015 - Due to underlying HFpEF, CKD, carpal tunnel surgery, borderline low voltage ECG and LVH will obtain myeloma panel & PYP to evaluate for cardiac amyloidosis.   - Previously she was extremely hypervolemic; we increased lasix from 40 BID to 80 in the morning and 60 at night with addition of metolazone for 2-3 days.  - sCr up to 4 on 04/19/23 at [facility].  - Hypervolemic today, will increase lasix to 80mg  in the mornings for x 3 days.  - Repeat BMP in 10 days.   2. Evaluation for PH - RV has normal function and morphology on TTE from 10/31/22. Elevated PASP likely secondary to chronically elevated left sided pressures. At this time will focus on diuretics and management of HFpEF. No indication for invasive hemodynamics and treatment for PH therapy. PASP by TTE unchanged on TTEs dating from 2020-2024.  - see above; dyspnea has improved. Will hold off on RHC. Unlikely to change outcomes at this time.  3. CKD IV - Baseline sCr 3-3.5 with a GFR persistently < 15.  - Will attempt to introduce SGLT2i in the future.  - Repeat BMP in 10 days; reviewed labs from 04/18/23 at [facility]; sCr elevated at 4.   4. Hypertension  - Currently on amlodipine 10mg , coreg 12.5mg ,  hydralazine 100mg  TID, cardura 4mg   - Renal artery duplex from 11/23/17 negative for RAS - No changes today; hypertensive but has not taken her cardura/amlodipine/coreg at this time.   I spent 37 minutes caring for this patient today including face to face time, ordering and reviewing labs, reviewing records from [facility] (12/4) & interpreting labs from them, seeing the patient, discussing treatment plan with patient/family member & paramedicine, documenting in the record, and arranging follow ups.    Shaneece Stockburger Advanced Heart Failure Mechanical Circulatory Support

## 2023-04-24 ENCOUNTER — Encounter (HOSPITAL_COMMUNITY): Payer: Self-pay | Admitting: Cardiology

## 2023-04-24 ENCOUNTER — Ambulatory Visit (HOSPITAL_COMMUNITY)
Admission: RE | Admit: 2023-04-24 | Discharge: 2023-04-24 | Disposition: A | Discharge status: Home / Self Care | Payer: Medicare Other | Source: Ambulatory Visit | Attending: Cardiology | Admitting: Cardiology

## 2023-04-24 ENCOUNTER — Inpatient Hospital Stay: Payer: Medicare Other | Attending: Hematology

## 2023-04-24 ENCOUNTER — Other Ambulatory Visit (HOSPITAL_COMMUNITY): Payer: Self-pay

## 2023-04-24 ENCOUNTER — Inpatient Hospital Stay: Payer: Medicare Other

## 2023-04-24 ENCOUNTER — Telehealth: Payer: Self-pay | Admitting: *Deleted

## 2023-04-24 VITALS — BP 164/78 | HR 54 | Wt 146.2 lb

## 2023-04-24 DIAGNOSIS — I10 Essential (primary) hypertension: Secondary | ICD-10-CM

## 2023-04-24 DIAGNOSIS — I5032 Chronic diastolic (congestive) heart failure: Secondary | ICD-10-CM | POA: Diagnosis not present

## 2023-04-24 DIAGNOSIS — K219 Gastro-esophageal reflux disease without esophagitis: Secondary | ICD-10-CM | POA: Insufficient documentation

## 2023-04-24 DIAGNOSIS — N184 Chronic kidney disease, stage 4 (severe): Secondary | ICD-10-CM | POA: Insufficient documentation

## 2023-04-24 DIAGNOSIS — D638 Anemia in other chronic diseases classified elsewhere: Secondary | ICD-10-CM | POA: Insufficient documentation

## 2023-04-24 DIAGNOSIS — Z79899 Other long term (current) drug therapy: Secondary | ICD-10-CM | POA: Diagnosis not present

## 2023-04-24 DIAGNOSIS — I130 Hypertensive heart and chronic kidney disease with heart failure and stage 1 through stage 4 chronic kidney disease, or unspecified chronic kidney disease: Secondary | ICD-10-CM | POA: Insufficient documentation

## 2023-04-24 DIAGNOSIS — I272 Pulmonary hypertension, unspecified: Secondary | ICD-10-CM | POA: Diagnosis not present

## 2023-04-24 LAB — CBC WITH DIFFERENTIAL (CANCER CENTER ONLY)
Abs Immature Granulocytes: 0.02 10*3/uL (ref 0.00–0.07)
Basophils Absolute: 0 10*3/uL (ref 0.0–0.1)
Basophils Relative: 1 %
Eosinophils Absolute: 0.1 10*3/uL (ref 0.0–0.5)
Eosinophils Relative: 2 %
HCT: 36.2 % (ref 36.0–46.0)
Hemoglobin: 11.3 g/dL — ABNORMAL LOW (ref 12.0–15.0)
Immature Granulocytes: 1 %
Lymphocytes Relative: 31 %
Lymphs Abs: 1.2 10*3/uL (ref 0.7–4.0)
MCH: 29.3 pg (ref 26.0–34.0)
MCHC: 31.2 g/dL (ref 30.0–36.0)
MCV: 93.8 fL (ref 80.0–100.0)
Monocytes Absolute: 0.3 10*3/uL (ref 0.1–1.0)
Monocytes Relative: 8 %
Neutro Abs: 2.3 10*3/uL (ref 1.7–7.7)
Neutrophils Relative %: 57 %
Platelet Count: 174 10*3/uL (ref 150–400)
RBC: 3.86 MIL/uL — ABNORMAL LOW (ref 3.87–5.11)
RDW: 14.6 % (ref 11.5–15.5)
WBC Count: 4 10*3/uL (ref 4.0–10.5)
nRBC: 0 % (ref 0.0–0.2)

## 2023-04-24 NOTE — Progress Notes (Signed)
Paramedicine Encounter    Patient ID: [name], female    DOB: March 02, 1934, 89 y.o.   MRN: [id number]   Came out today for med rec post clinic visit since she didn't bring her meds with her when she came to clinic.   Meds verified and pill box refilled.   She is missing the folic acid and sodium bicarb-waiting on dr to refill    Sent message to the nurse that approved her carvedilol from PCP office but didn't approve the folic acid and the sodium bicarb.    Patient Care Team: Panosh, Neta Mends, MD as PCP - General (Internal Medicine) Nahser, Deloris Ping, MD as PCP - Cardiology (Cardiology) August Saucer, Corrie Mckusick, MD (Orthopedic Surgery) Malachy Mood, MD as Consulting Physician (Hematology) Bufford Buttner, MD as Consulting Physician (Nephrology) Mateo Flow, MD as Consulting Physician (Ophthalmology) Eber Jones, MD as Referring Physician (Ophthalmology) Delton Coombes Les Pou, MD as Consulting Physician (Pulmonary Disease) Sherrill Raring, [facility] (Pharmacist)  Patient Active Problem List   Diagnosis Date Noted   CAP (community acquired pneumonia) 10/31/2022   Diastolic congestive heart failure (HCC) 10/31/2022   Hypertensive urgency 10/31/2022   Moderate persistent asthma 10/31/2022   Hypomagnesemia 10/31/2022   CKD (chronic kidney disease), stage IV (HCC) 10/31/2022   Pulmonary hypertension, unspecified (HCC) 12/06/2021   Chronic cough 10/18/2021   Chronic rhinitis 11/24/2019   Nausea 11/03/2018   Elevated troponin 11/03/2018   Hyponatremia 11/03/2018   Hypertensive heart disease with hypertensive chronic kidney disease 11/30/2017   Fasting hyperglycemia 11/30/2017   Renal cyst 03/27/2016   CKD (chronic kidney disease) stage 3, GFR 30-59 ml/min (HCC) 08/23/2015   Subjective hearing change 06/29/2014   Resistant hypertension 06/29/2014   Lumbago 06/15/2014   Pre-diabetes vs early DM  06/15/2014   Dyspnea 04/27/2014   Asthma, chronic 04/13/2014   Elevated uric acid in  blood 04/13/2014   Essential hypertension 03/27/2014   History of DVT (deep vein thrombosis)    Palpitations 01/05/2014   Anemia of chronic disease Jan 01, 2014   Death of family member 09-05-2013   Leg edema 06/20/2013   Bereavement due to life event 04/21/2013   Other dysphagia 02/11/2013   Colon cancer screening 02/11/2013   Anxiety state 01/20/2013   Leg cramps 01/20/2013   Back pain 12/05/2012   Family hx of colon cancer 10/21/2012   Family hx of lung cancer 10/21/2012   Family hx-breast malignancy 10/21/2012   Renal insufficiency creatinine 1.3 10/21/2012   Anemia, chronic disease 10/21/2012   GERD (gastroesophageal reflux disease) 09/08/2012   HTN (hypertension) 09/08/2012   Hyperlipidemia 09/08/2012   Varicose veins of leg with complications 12/05/2010    Current Outpatient Medications:    acetaminophen (TYLENOL) 500 MG tablet, Take 500 mg by mouth in the morning and at bedtime., Disp: , Rfl:    acetaminophen-codeine (TYLENOL #3) 300-30 MG tablet, Take 1 tablet by mouth every 8 (eight) hours as needed for moderate pain., Disp: 25 tablet, Rfl: 0   albuterol (PROVENTIL) (2.5 MG/3ML) 0.083% nebulizer solution, Take 3 mLs (2.5 mg total) by nebulization every 6 (six) hours as needed for shortness of breath or wheezing., Disp: 360 mL, Rfl: 6   albuterol (VENTOLIN HFA) 108 (90 Base) MCG/ACT inhaler, USE 2 PUFFS EVERY 6 HOURS AS NEEDED FOR WHEEZING., Disp: 8.5 g, Rfl: 3   amLODipine (NORVASC) 10 MG tablet, Take 1 tablet (10 mg total) by mouth daily. (Patient taking differently: Take 10 mg by mouth at bedtime.), Disp: 30 tablet, Rfl: 1  atorvastatin (LIPITOR) 40 MG tablet, Take 1 tablet (40 mg total) by mouth daily., Disp: 90 tablet, Rfl: 3   atropine 1 % ophthalmic solution, Place 1 drop into both eyes 2 (two) times daily., Disp: , Rfl:    Azelastine-Fluticasone 137-50 MCG/ACT SUSP, PLACE ONE SPRAY IN EACH NOSTRIL TWICE DAILY (IN THE MORNING AND AT BEDTIME), Disp: 23 g, Rfl: 6    Budeson-Glycopyrrol-Formoterol (BREZTRI AEROSPHERE) 160-9-4.8 MCG/ACT AERO, INHALE 2 PUFFS INTO THE LUNGS TWICE DAILY, IN THE MORNING & AT BEDTIME., Disp: 10.7 g, Rfl: 11   carvedilol (COREG) 12.5 MG tablet, Take 1 tablet (12.5 mg total) by mouth 2 (two) times daily., Disp: 180 tablet, Rfl: 3   cetirizine (ZYRTEC) 10 MG tablet, Take 10 mg by mouth daily as needed for allergies or rhinitis., Disp: , Rfl:    citalopram (CELEXA) 20 MG tablet, Take 1 tablet (20 mg total) by mouth every evening., Disp: 90 tablet, Rfl: 0   clopidogrel (PLAVIX) 75 MG tablet, TAKE ONE TABLET BY MOUTH ONCE DAILY., Disp: 30 tablet, Rfl: 1   diclofenac Sodium (VOLTAREN) 1 % GEL, Apply 4 g topically 2 (two) times daily as needed (painful areas)., Disp: 50 g, Rfl: 1   doxazosin (CARDURA) 4 MG tablet, take ONE tab every 12 hours, Disp: 60 tablet, Rfl: 0   ferrous sulfate 325 (65 FE) MG EC tablet, Take 1 tablet (325 mg total) by mouth daily with breakfast., Disp: 30 tablet, Rfl: 3   fluticasone (FLONASE) 50 MCG/ACT nasal spray, Place 2 sprays into both nostrils as needed for allergies or rhinitis., Disp: , Rfl:    folic acid (FOLVITE) 1 MG tablet, take ONE tab daily, Disp: 30 tablet, Rfl: 0   furosemide (LASIX) 40 MG tablet, Take 1.5 tablets (60 mg total) by mouth 2 (two) times daily., Disp: 270 tablet, Rfl: 3   hydrALAZINE (APRESOLINE) 100 MG tablet, Take 1 tablet (100 mg total) by mouth 3 (three) times daily., Disp: 270 tablet, Rfl: 0   isosorbide mononitrate (IMDUR) 30 MG 24 hr tablet, Take 1 tablet (30 mg total) by mouth daily., Disp: 90 tablet, Rfl: 3   LORazepam (ATIVAN) 0.5 MG tablet, TAKE ONE TABLET BY MOUTH TWICE DAILY AS NEEDED FOR ANXIETY, Disp: 30 tablet, Rfl: 0   metolazone (ZAROXOLYN) 2.5 MG tablet, Take 1 tablet (2.5 mg total) by mouth once a week. Take every Friday, Disp: 15 tablet, Rfl: 3   montelukast (SINGULAIR) 10 MG tablet, Take 1 tablet (10 mg total) by mouth daily., Disp: 90 tablet, Rfl: 3   pantoprazole  (PROTONIX) 40 MG tablet, Take 1 tablet (40 mg total) by mouth daily., Disp: 90 tablet, Rfl: 3   potassium chloride (KLOR-CON M) 10 MEQ tablet, Take 10 mEq by mouth daily. Take an extra 20 meq (two tablets) on Fridays (this is the day you take Metolazone), Disp: , Rfl:    RESTASIS 0.05 % ophthalmic emulsion, Place 1 drop into both eyes 2 (two) times daily., Disp: , Rfl:    sodium bicarbonate 650 MG tablet, take ONE tab twice a DAY, Disp: 60 tablet, Rfl: 0   Spacer/Aero-Holding Chambers (AEROCHAMBER PLUS) inhaler, Use as instructed (Patient taking differently: Use as instructed as needed), Disp: 1 each, Rfl: 2   vitamin B-12 (CYANOCOBALAMIN) 1000 MCG tablet, Take 1,000 mcg by mouth daily., Disp: , Rfl:    Vitamin D, Cholecalciferol, 25 MCG (1000 UT) TABS, Take 1,000 Units by mouth daily., Disp: , Rfl:  Allergies  Allergen Reactions   Penicillins Nausea And Vomiting  and Other (See Comments)    Has patient had a PCN reaction causing immediate rash, facial/tongue/throat swelling, SOB or lightheadedness with hypotension: Y Has patient had a PCN reaction causing severe rash involving mucus membranes or skin necrosis: Y Has patient had a PCN reaction that required hospitalization: N Has patient had a PCN reaction occurring within the last 10 years: N If all of the above answers are "NO", then may proceed with Cephalosporin use.    Aspirin Other (See Comments)    Wheezing Acetaminophen is OK       Social History   Socioeconomic History   Marital status: Single    Spouse name: Not on file   Number of children: 6   Years of education: Not on file   Highest education level: Not on file  Occupational History   Occupation: retired  Tobacco Use   Smoking status: Never   Smokeless tobacco: Never  Vaping Use   Vaping status: Never Used  Substance and Sexual Activity   Alcohol use: No   Drug use: No   Sexual activity: Not Currently    Birth control/protection: Surgical  Other Topics Concern    Not on file  Social History Narrative   2 people living in the home.  Grand daughter.   Up and down through the night      Had 7 children  2 deceased . 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    works 3 days per week currently .   Glasses dentures  Neg tad    Social Determinants of Health   Financial Resource Strain: Low Risk  (04/14/2022)   Overall Financial Resource Strain (CARDIA)    Difficulty of Paying Living Expenses: Not very hard  Food Insecurity: No Food Insecurity (12/01/2022)   Hunger Vital Sign    Worried About Running Out of Food in the Last Year: Never true    Ran Out of Food in the Last Year: Never true  Transportation Needs: No Transportation Needs (12/01/2022)   PRAPARE - Administrator, Civil Service (Medical): No    Lack of Transportation (Non-Medical): No  Physical Activity: Not on file  Stress: Not on file  Social Connections: Not on file  Intimate Partner Violence: Not At Risk (10/31/2022)   Humiliation, Afraid, Rape, and Kick questionnaire    Fear of Current or Ex-Partner: No    Emotionally Abused: No    Physically Abused: No    Sexually Abused: No    Physical Exam      Future Appointments  Date Time Provider Department Center  05/03/2023  3:00 PM [facility] LAB [facility] None  05/22/2023  1:00 PM [facility] LAB [facility] None  05/22/2023  1:45 PM [facility] FLUSH [facility] None  06/19/2023  1:00 PM [facility] LAB [facility] None  06/19/2023  1:45 PM [facility] FLUSH [facility] None  07/17/2023  1:00 PM [facility] LAB [facility] None  07/17/2023  1:30 PM Carlean Jews, NP [facility] None  07/17/2023  2:15 PM [facility] FLUSH [facility] None       Kerry Hough, Paramedic [phone] [facility]  04/24/23

## 2023-04-24 NOTE — Patient Instructions (Signed)
Medication Changes:  INCREASE Furosemide to 80 mg in AM and 60 mg in PM FOR 3 DAYS ONLY, then take 60 mg Twice daily   Lab Work:  Your physician recommends that you return for lab work in: 1-2 weeks  Testing/Procedures:  You have been ordered a PYP Scan.  This is done at [facility], they will call you to schedule.  When you come for this test please plan to be there 2-3 hours.  They are located at: [address] [place], [place] [address]  Referrals:  none  Special Instructions // Education:  Do the following things EVERYDAY: Weigh yourself in the morning before breakfast. Write it down and keep it in a log. Take your medicines as prescribed Eat low salt foods--Limit salt (sodium) to 2000 mg per day.  Stay as active as you can everyday Limit all fluids for the day to less than 2 liters   Follow-Up in: 4 months (April 2025), **PLEASE CALL OUR OFFICE IN FEBRUARY TO SCHEDULE THIS APPOINTMENT   At the [facility], you and your health needs are our priority. We have a designated team specialized in the treatment of Heart Failure. This Care Team includes your primary Heart Failure Specialized Cardiologist (physician), Advanced Practice Providers (APPs- Physician Assistants and Nurse Practitioners), and Pharmacist who all work together to provide you with the care you need, when you need it.   You may see any of the following providers on your designated Care Team at your next follow up:  Dr. Arvilla Meres Dr. Marca Ancona Dr. Dorthula Nettles Dr. Theresia Bough Tonye Becket, NP Robbie Lis, [place] [facility] [place], [place] Brynda Peon, NP [place] Lee, NP Karle Plumber, PharmD   Please be sure to bring in all your medications bottles to every appointment.   Need to Contact us:  If you have any questions or concerns before your next appointment please send [place] a message through [place] or call our office at [phone].    TO LEAVE A MESSAGE FOR THE  NURSE SELECT OPTION 2, PLEASE LEAVE A MESSAGE INCLUDING: YOUR NAME DATE OF BIRTH CALL BACK NUMBER REASON FOR CALL**this is important as we prioritize the call backs  YOU WILL RECEIVE A CALL BACK THE SAME DAY AS LONG AS YOU CALL BEFORE 4:00 PM

## 2023-04-24 NOTE — Progress Notes (Signed)
Paramedicine Encounter   Patient ID: [name] , female,   DOB: 1933/10/05,89 y.o.,  MRN: [id number]   Met patient in clinic today with provider.  Weight @ clinic-146  B/P-164/78 P-54 [id number]  She only took her hydralazine this morning  She remembers on Sunday that she took it she said it was a bit high but can't recall numbers.  Weights at home between 147-150  She reports feeling fluttering in her chest the other day-she calls it anxiety. This happens while at rest, most recently occurred a few days ago.   Still waiting for PYP can to be done -it was ordered but not been done yet-provider is going to look into why.   Relayed to provider about her palpitations and if this continues after more fluid to take off then to let him know and he can order zio-patch.   Med changes-next 3 days increase to 80mg  in the AM.  She did not bring her meds with her so will have to return back this afternoon for med rec.   Labs to be redone in 10 days.   Kerry Hough, EMT-Paramedic [phone] 04/24/2023

## 2023-04-24 NOTE — Progress Notes (Signed)
Patient here for Aranesp inj, Hgb 11.3, so she does not meet requirements for injection. Labs printed off and given to patient.

## 2023-04-25 ENCOUNTER — Other Ambulatory Visit: Payer: Self-pay

## 2023-04-25 ENCOUNTER — Encounter (HOSPITAL_COMMUNITY): Payer: Self-pay

## 2023-04-25 ENCOUNTER — Emergency Department (HOSPITAL_COMMUNITY): Payer: Medicare Other

## 2023-04-25 ENCOUNTER — Emergency Department (HOSPITAL_COMMUNITY)
Admission: EM | Admit: 2023-04-25 | Discharge: 2023-04-25 | Disposition: A | Discharge status: Home / Self Care | Payer: Medicare Other | Attending: Emergency Medicine | Admitting: Emergency Medicine

## 2023-04-25 DIAGNOSIS — R519 Headache, unspecified: Secondary | ICD-10-CM | POA: Diagnosis not present

## 2023-04-25 DIAGNOSIS — N189 Chronic kidney disease, unspecified: Secondary | ICD-10-CM | POA: Diagnosis not present

## 2023-04-25 DIAGNOSIS — E860 Dehydration: Secondary | ICD-10-CM | POA: Insufficient documentation

## 2023-04-25 DIAGNOSIS — I129 Hypertensive chronic kidney disease with stage 1 through stage 4 chronic kidney disease, or unspecified chronic kidney disease: Secondary | ICD-10-CM | POA: Insufficient documentation

## 2023-04-25 DIAGNOSIS — S0990XA Unspecified injury of head, initial encounter: Secondary | ICD-10-CM | POA: Diagnosis not present

## 2023-04-25 DIAGNOSIS — R42 Dizziness and giddiness: Secondary | ICD-10-CM | POA: Diagnosis not present

## 2023-04-25 DIAGNOSIS — Z79899 Other long term (current) drug therapy: Secondary | ICD-10-CM | POA: Diagnosis not present

## 2023-04-25 DIAGNOSIS — R29818 Other symptoms and signs involving the nervous system: Secondary | ICD-10-CM | POA: Diagnosis not present

## 2023-04-25 LAB — TROPONIN I (HIGH SENSITIVITY)
Troponin I (High Sensitivity): 117 ng/L (ref <18)
Troponin I (High Sensitivity): 123 ng/L (ref <18)

## 2023-04-25 LAB — CBC
HCT: 36.4 % (ref 36.0–46.0)
Hemoglobin: 11.1 g/dL — ABNORMAL LOW (ref 12.0–15.0)
MCH: 29.1 pg (ref 26.0–34.0)
MCHC: 30.5 g/dL (ref 30.0–36.0)
MCV: 95.3 fL (ref 80.0–100.0)
Platelets: 167 10*3/uL (ref 150–400)
RBC: 3.82 MIL/uL — ABNORMAL LOW (ref 3.87–5.11)
RDW: 14.6 % (ref 11.5–15.5)
WBC: 3.3 10*3/uL — ABNORMAL LOW (ref 4.0–10.5)
nRBC: 0 % (ref 0.0–0.2)

## 2023-04-25 LAB — URINALYSIS, ROUTINE W REFLEX MICROSCOPIC
Bilirubin Urine: NEGATIVE
Glucose, UA: NEGATIVE mg/dL
Ketones, ur: NEGATIVE mg/dL
Leukocytes,Ua: NEGATIVE
Nitrite: NEGATIVE
Protein, ur: >=300 mg/dL — AB
Specific Gravity, Urine: 1.01 (ref 1.005–1.030)
pH: 6 (ref 5.0–8.0)

## 2023-04-25 LAB — BASIC METABOLIC PANEL
Anion gap: 8 (ref 5–15)
BUN: 46 mg/dL — ABNORMAL HIGH (ref 8–23)
CO2: 24 mmol/L (ref 22–32)
Calcium: 9.1 mg/dL (ref 8.9–10.3)
Chloride: 103 mmol/L (ref 98–111)
Creatinine, Ser: 3.93 mg/dL — ABNORMAL HIGH (ref 0.44–1.00)
GFR, Estimated: 11 mL/min — ABNORMAL LOW (ref >60)
Glucose, Bld: 116 mg/dL — ABNORMAL HIGH (ref 70–99)
Potassium: 3.8 mmol/L (ref 3.5–5.1)
Sodium: 135 mmol/L (ref 135–145)

## 2023-04-25 LAB — CBG MONITORING, ED: Glucose-Capillary: 116 mg/dL — ABNORMAL HIGH (ref 70–99)

## 2023-04-25 MED ORDER — SODIUM CHLORIDE 0.9 % IV BOLUS
500.0000 mL | Freq: Once | INTRAVENOUS | Status: AC
  Administered 2023-04-25: 500 mL via INTRAVENOUS

## 2023-04-25 MED ORDER — AMLODIPINE BESYLATE 5 MG PO TABS
10.0000 mg | ORAL_TABLET | Freq: Once | ORAL | Status: AC
Start: 2023-04-25 — End: 2023-04-25
  Administered 2023-04-25: 10 mg via ORAL
  Filled 2023-04-25: qty 2

## 2023-04-25 MED ORDER — LORAZEPAM 2 MG/ML IJ SOLN: 0.5000 mg | Freq: Once | INTRAMUSCULAR | Status: DC | PRN

## 2023-04-25 MED ORDER — CARVEDILOL 12.5 MG PO TABS
12.5000 mg | ORAL_TABLET | Freq: Once | ORAL | Status: AC
Start: 2023-04-25 — End: 2023-04-25
  Administered 2023-04-25: 12.5 mg via ORAL
  Filled 2023-04-25: qty 1

## 2023-04-25 MED ORDER — METOCLOPRAMIDE HCL 5 MG/ML IJ SOLN
10.0000 mg | Freq: Once | INTRAMUSCULAR | Status: AC
  Administered 2023-04-25: 10 mg via INTRAVENOUS
  Filled 2023-04-25: qty 2

## 2023-04-25 MED ORDER — DIPHENHYDRAMINE HCL 50 MG/ML IJ SOLN
12.5000 mg | Freq: Once | INTRAMUSCULAR | Status: AC
  Administered 2023-04-25: 12.5 mg via INTRAVENOUS
  Filled 2023-04-25: qty 1

## 2023-04-25 MED ORDER — ACETAMINOPHEN 325 MG PO TABS
650.0000 mg | ORAL_TABLET | Freq: Once | ORAL | Status: AC
  Administered 2023-04-25: 650 mg via ORAL
  Filled 2023-04-25: qty 2

## 2023-04-25 MED ORDER — DOXAZOSIN MESYLATE 4 MG PO TABS
4.0000 mg | ORAL_TABLET | Freq: Once | ORAL | Status: DC
  Filled 2023-04-25: qty 1

## 2023-04-25 MED ORDER — HYDRALAZINE HCL 25 MG PO TABS
100.0000 mg | ORAL_TABLET | Freq: Once | ORAL | Status: AC
Start: 2023-04-25 — End: 2023-04-25
  Administered 2023-04-25: 100 mg via ORAL
  Filled 2023-04-25: qty 4

## 2023-04-25 NOTE — ED Triage Notes (Signed)
Pt came to ED for feeling lightheaded, pt has HA for a couple of days. Hx of HTN. Axox4.

## 2023-04-25 NOTE — ED Provider Notes (Signed)
[place] EMERGENCY DEPARTMENT AT [facility] Provider Note   CSN: [id number] Arrival date & time: 04/25/23  1154     History  Chief Complaint  Patient presents with   Dizziness    [name] is a 89 y.o. female.  HPI 89 year old female history of hypertension, hyperlipidemia, CKD, anemia, hypertensive heart disease, presents today complaining of headache for several days.  Granddaughter thinks that she is on Coumadin but this is not listed medications.  No trauma or fever has been noted.  Patient has been complaining of headache across forehead.  She does not typically get headaches and has not taken anything for this.  She felt somewhat lightheaded and dizzy today.  Is difficult to get an exact description.  She does describe some spinning and lightheadedness both. No complaints of chest pain or dyspnea    Home Medications Prior to Admission medications   Medication Sig Start Date End Date Taking? Authorizing Provider  acetaminophen (TYLENOL) 500 MG tablet Take 500 mg by mouth in the morning and at bedtime.   Yes [provider]  albuterol (PROVENTIL) (2.5 MG/3ML) 0.083% nebulizer solution Take 3 mLs (2.5 mg total) by nebulization every 6 (six) hours as needed for shortness of breath or wheezing. 08/03/22  Yes Carlisle Beers, FNP  albuterol (VENTOLIN HFA) 108 (90 Base) MCG/ACT inhaler USE 2 PUFFS EVERY 6 HOURS AS NEEDED FOR WHEEZING. 04/19/23  Yes Leslye Peer, MD  amLODipine (NORVASC) 10 MG tablet Take 1 tablet (10 mg total) by mouth daily. Patient taking differently: Take 10 mg by mouth at bedtime. 11/07/22  Yes Osvaldo Shipper, MD  atorvastatin (LIPITOR) 40 MG tablet Take 1 tablet (40 mg total) by mouth daily. 04/21/22  Yes Nahser, Deloris Ping, MD  atropine 1 % ophthalmic solution Place 1 drop into both eyes 2 (two) times daily as needed.   Yes [provider]  Azelastine-Fluticasone 137-50 MCG/ACT SUSP PLACE ONE SPRAY IN EACH NOSTRIL  TWICE DAILY (IN THE MORNING AND AT BEDTIME) Patient taking differently: Place 1 spray into both nostrils in the morning and at bedtime. 03/12/23  Yes Leslye Peer, MD  Budeson-Glycopyrrol-Formoterol (BREZTRI AEROSPHERE) 160-9-4.8 MCG/ACT AERO INHALE 2 PUFFS INTO THE LUNGS TWICE DAILY, IN THE MORNING & AT BEDTIME. 04/19/23  Yes Byrum, Les Pou, MD  carvedilol (COREG) 12.5 MG tablet Take 1 tablet (12.5 mg total) by mouth 2 (two) times daily. 04/06/23  Yes Nahser, Deloris Ping, MD  cetirizine (ZYRTEC) 10 MG tablet Take 10 mg by mouth daily as needed for allergies or rhinitis.   Yes [provider]  citalopram (CELEXA) 20 MG tablet Take 1 tablet (20 mg total) by mouth every evening. 09/12/22  Yes Panosh, Neta Mends, MD  clopidogrel (PLAVIX) 75 MG tablet TAKE ONE TABLET BY MOUTH ONCE DAILY. 03/26/23  Yes Worthy Rancher B, FNP  diclofenac Sodium (VOLTAREN) 1 % GEL Apply 4 g topically 2 (two) times daily as needed (painful areas). 05/02/22  Yes Ellsworth Lennox, PA-C  doxazosin (CARDURA) 4 MG tablet take ONE tab every 12 hours Patient taking differently: Take 4 mg by mouth 2 (two) times daily. 04/11/23  Yes Sabharwal, Aditya, DO  ferrous sulfate 325 (65 FE) MG EC tablet Take 1 tablet (325 mg total) by mouth daily with breakfast. 12/15/14  Yes Panosh, Neta Mends, MD  fluticasone (FLONASE) 50 MCG/ACT nasal spray Place 2 sprays into both nostrils as needed for allergies or rhinitis.   Yes [provider]  folic acid (FOLVITE) 1  MG tablet take ONE tab daily 04/22/23  Yes Worthy Rancher B, FNP  furosemide (LASIX) 40 MG tablet Take 1.5 tablets (60 mg total) by mouth 2 (two) times daily. 02/26/23 05/27/23 Yes Milford, Anderson Malta, FNP  hydrALAZINE (APRESOLINE) 100 MG tablet Take 1 tablet (100 mg total) by mouth 3 (three) times daily. 03/28/23  Yes Nahser, Deloris Ping, MD  isosorbide mononitrate (IMDUR) 30 MG 24 hr tablet Take 1 tablet (30 mg total) by mouth daily. 04/02/23 07/01/23 Yes [place]ford, Anderson Malta, FNP  metolazone  (ZAROXOLYN) 2.5 MG tablet Take 1 tablet (2.5 mg total) by mouth once a week. Take every Friday 02/26/23  Yes [place]ford, Anderson Malta, FNP  montelukast (SINGULAIR) 10 MG tablet Take 1 tablet (10 mg total) by mouth daily. Patient taking differently: Take 10 mg by mouth at bedtime. 04/19/23  Yes Leslye Peer, MD  pantoprazole (PROTONIX) 40 MG tablet Take 1 tablet (40 mg total) by mouth daily. Patient taking differently: Take 40 mg by mouth 2 (two) times daily. 04/19/23  Yes Leslye Peer, MD  potassium chloride (KLOR-CON M) 10 MEQ tablet Take 10 mEq by mouth daily. Take an extra 20 meq (two tablets) on Fridays (this is the day you take Metolazone)   Yes [provider]  RESTASIS 0.05 % ophthalmic emulsion Place 1 drop into both eyes 2 (two) times daily. 10/13/21  Yes [provider]  sodium bicarbonate 650 MG tablet take ONE tab twice a DAY Patient taking differently: Take 650 mg by mouth 2 (two) times daily. 04/22/23  Yes Eulis Foster, FNP  Spacer/Aero-Holding Chambers (AEROCHAMBER PLUS) inhaler Use as instructed Patient taking differently: Use as instructed as needed 05/26/16  Yes Domenick Gong, MD  vitamin B-12 (CYANOCOBALAMIN) 1000 MCG tablet Take 1,000 mcg by mouth daily.   Yes [provider]  Vitamin D, Cholecalciferol, 25 MCG (1000 UT) TABS Take 1,000 Units by mouth daily.   Yes [provider]  cloNIDine (CATAPRES) 0.1 MG tablet Take 1 tablet (0.1 mg total) by mouth 2 (two) times daily. If needed for hypertension 05/02/23   Panosh, Neta Mends, MD  LORazepam (ATIVAN) 0.5 MG tablet Take 1 tablet (0.5 mg total) by mouth 2 (two) times daily as needed. for anxiety 04/26/23   Panosh, Neta Mends, MD  oxyCODONE (OXY IR/ROXICODONE) 5 MG immediate release tablet Take 1 tablet (5 mg total) by mouth every 6 (six) hours as needed for severe pain (pain score 7-10). 05/02/23   Panosh, Neta Mends, MD  potassium chloride (KLOR-CON) 10 MEQ tablet TAKE FOUR TABLETS BY MOUTH  DAILY. Patient taking differently: Take 20 mEq by mouth 2 (two) times daily. 10/19/22 11/06/22  Panosh, Neta Mends, MD      Allergies    Penicillins and Aspirin    Review of Systems   Review of Systems  Physical Exam Updated Vital Signs BP (!) 159/60   Pulse (!) 54   Temp 97.6 F (36.4 C) (Oral)   Resp 16   Ht 1.6 m (5\' 3" )   Wt 65.8 kg   SpO2 99%   BMI 25.69 kg/m  Physical Exam Vitals reviewed.  Constitutional:      Appearance: Normal appearance.  HENT:     Head: Normocephalic and atraumatic.     Right Ear: External ear normal.     Left Ear: External ear normal.     Nose: Nose normal.     Mouth/Throat:     Mouth: Mucous membranes are moist.     Pharynx:  Oropharynx is clear.  Eyes:     Extraocular Movements: Extraocular movements intact.     Pupils: Pupils are equal, round, and reactive to light.  Cardiovascular:     Rate and Rhythm: Normal rate and regular rhythm.     Pulses: Normal pulses.  Pulmonary:     Effort: Pulmonary effort is normal.  Abdominal:     General: Abdomen is flat.     Palpations: Abdomen is soft.  Musculoskeletal:        General: Normal range of motion.     Cervical back: Normal range of motion.  Skin:    General: Skin is warm and dry.     Capillary Refill: Capillary refill takes less than 2 seconds.  Neurological:     General: No focal deficit present.     Mental Status: She is alert.  Psychiatric:        Mood and Affect: Mood normal.     ED Results / Procedures / Treatments   Labs (all labs ordered are listed, but only abnormal results are displayed) Labs Reviewed  BASIC METABOLIC PANEL - Abnormal; Notable for the following components:      Result Value   Glucose, Bld 116 (*)    BUN 46 (*)    Creatinine, Ser 3.93 (*)    GFR, Estimated 11 (*)    All other components within normal limits  CBC - Abnormal; Notable for the following components:   WBC 3.3 (*)    RBC 3.82 (*)    Hemoglobin 11.1 (*)    All other components within  normal limits  URINALYSIS, ROUTINE W REFLEX MICROSCOPIC - Abnormal; Notable for the following components:   Hgb urine dipstick SMALL (*)    Protein, ur >=300 (*)    Bacteria, UA RARE (*)    All other components within normal limits  CBG MONITORING, ED - Abnormal; Notable for the following components:   Glucose-Capillary 116 (*)    All other components within normal limits  TROPONIN I (HIGH SENSITIVITY) - Abnormal; Notable for the following components:   Troponin I (High Sensitivity) 117 (*)    All other components within normal limits  TROPONIN I (HIGH SENSITIVITY) - Abnormal; Notable for the following components:   Troponin I (High Sensitivity) 123 (*)    All other components within normal limits    EKG EKG Interpretation Date/Time:  Wednesday April 25 2023 14:13:40 EST Ventricular Rate:  58 PR Interval:  191 QRS Duration:  99 QT Interval:  491 QTC Calculation: 483 R Axis:   78  Text Interpretation: Sinus rhythm st elevation v1 and v2, decreased from first prior earlier today Anteroseptal infarct, old Confirmed by Margarita Grizzle [phone]) on 04/25/2023 2:26:37 PM Reviewed EKGs from earlier today and upward coving noted in V1 through V3 more pronounced than current EKG Radiology No results found.  Procedures Procedures    Medications Ordered in ED Medications  acetaminophen (TYLENOL) tablet 650 mg (650 mg Oral Given 04/25/23 1552)  sodium chloride 0.9 % bolus 500 mL (0 mLs Intravenous Stopped 04/25/23 2233)  metoCLOPramide (REGLAN) injection 10 mg (10 mg Intravenous Given 04/25/23 2029)  diphenhydrAMINE (BENADRYL) injection 12.5 mg (12.5 mg Intravenous Given 04/25/23 2033)  amLODipine (NORVASC) tablet 10 mg (10 mg Oral Given 04/25/23 2005)  carvedilol (COREG) tablet 12.5 mg (12.5 mg Oral Given 04/25/23 2006)  hydrALAZINE (APRESOLINE) tablet 100 mg (100 mg Oral Given 04/25/23 2006)    ED Course/ Medical Decision Making/ A&P Clinical Course as of 05/07/23 1120  Wed Apr 25, 2023  1424 Glucose reviewed normal at 116 Basic metabolic panel reviewed interpreted significant for BUN elevated at 46 and creatinine 3.93 creatinine appears stable with last several within this range [DR]  1425 Troponin is elevated at 117 EKG is reviewed [DR]  1624 CT head reviewed and question of bleeding awaiting radiology interpretation [DR]    Clinical Course User Index [DR] Margarita Grizzle, MD                                 Medical Decision Making Amount and/or Complexity of Data Reviewed Labs: ordered. Radiology: ordered.  Risk OTC drugs. Prescription drug management.  89 year old female presents today complaining of headache, dizziness, Differential diagnosis includes but is not limited to Acute intracranial hemorrhage, mass lesions, tension or migraine headaches Patient also complaining of dizziness includes but not limited to Cardiac etiology, anemia, metabolic derangements including hypoglycemia and abnormalities of electrolytes, renal failure 1- headache- will give acetaminiophen pending head ct 2-dizziness- awaiting head ct.  Patient with abnormal EKG which normalized over serial tests.  She had initial trop elevated at 111. Previous trops elevated.  No chest pain.  Awaiting second trop 3- ckd- appears stable 4-bp elevated at 161/54 with known hypertension.  Will trend and recheck with pain improvement  Awaiting ct results Signed out to Dr. Particia Nearing for dispo pending head ct results        Final Clinical Impression(s) / ED Diagnoses Final diagnoses:  Dizziness  Dehydration    Rx / DC Orders ED Discharge Orders     None         Margarita Grizzle, MD 05/07/23 1120

## 2023-04-25 NOTE — ED Provider Notes (Signed)
Pt was signed out by Dr. Rosalia Hammers pending CT head.  CT head reviewed by me.  I agree with the radiologist.  CT head:  No evidence of acute intracranial abnormality.   Pt still c/o headache and dizziness.  IVFs and reglan/benadryl ordered.  MRI brain ordered.  Pt is feeling much better after meds.  She is able to ambulate.  She has good family support.  She is stable for d/c.  Return if worse.  F/u with pcp.    Jacalyn Lefevre, MD 04/25/23 2242

## 2023-04-25 NOTE — ED Provider Triage Note (Cosign Needed Addendum)
Emergency Medicine Provider Triage Evaluation Note  Kamelah Zeilinger Chaplin , a 89 y.o. female  was evaluated in triage.  Pt complains of headache. Severe throbbing frontal headache ongoing x 3-4 days accompany with dizziness and elevated BP.  No fever, chills, runny nose, sneeze, cough, n/v/d, focal numbness or focal weakness.    Review of Systems  Positive: As above Negative: As above  Physical Exam  BP (!) 161/54   Pulse (!) 54   Temp 97.8 F (36.6 C) (Oral)   Resp 16   Ht 5\' 3"  (1.6 m)   Wt 65.8 kg   SpO2 97%   BMI 25.69 kg/m  Gen:   Awake, no distress   Resp:  Normal effort  MSK:   Moves extremities without difficulty  Other:    Medical Decision Making  Medically screening exam initiated at 12:29 PM.  Appropriate orders placed.  Lilu Kirst Fleurant was informed that the remainder of the evaluation will be completed by another provider, this initial triage assessment does not replace that evaluation, and the importance of remaining in the ED until their evaluation is complete.     Fayrene Helper, PA-C 04/25/23 1230   ADDENDUM: EKG shows ST elevation in anterior and inferior leads suggestive of acute MI.  Pt however without headache, but does report headache and dizzy.  Will check trop.  Attending aware of abnormal EKG, will recheck EKG.     Fayrene Helper, PA-C 04/25/23 1310

## 2023-04-25 NOTE — ED Notes (Signed)
2 EKGS completed and inconclusive. Needs 3rd when laying on stretcher per [place].

## 2023-04-25 NOTE — ED Notes (Addendum)
Pt ambulated with steady gait and assistance. VSS.

## 2023-04-26 ENCOUNTER — Telehealth: Payer: Self-pay

## 2023-04-26 ENCOUNTER — Telehealth (HOSPITAL_COMMUNITY): Payer: Self-pay | Admitting: Cardiology

## 2023-04-26 MED ORDER — LORAZEPAM 0.5 MG PO TABS: 0.5000 mg | ORAL_TABLET | Freq: Two times a day (BID) | ORAL | 0 refills | Status: DC | PRN

## 2023-04-26 NOTE — Transitions of Care (Post Inpatient/ED Visit) (Signed)
04/26/2023  Name: [name] MRN: [id number] DOB: December 19, 1933  Today's TOC FU Call Status: Today's TOC FU Call Status:: Successful TOC FU Call Completed TOC FU Call Complete Date: 04/26/23 Patient's Name and Date of Birth confirmed.  Transition Care Management Follow-up Telephone Call Date of Discharge: 04/25/23 Discharge Facility: [facility]) Type of Discharge: Emergency Department Reason for ED Visit: Other: (dizziness) How have you been since you were released from the hospital?: Better Any questions or concerns?: No  Items Reviewed: Did you receive and understand the discharge instructions provided?: No Medications obtained,verified, and reconciled?: Yes (Medications Reviewed) Any new allergies since your discharge?: No Dietary orders reviewed?: Yes Do you have support at home?: Yes People in Home: grandchild(ren)  Medications Reviewed Today: Medications Reviewed Today     Reviewed by Karena Addison, LPN (Licensed Practical Nurse) on 04/26/23 at 1145  Med List Status: <None>   Medication Order Taking? Sig Documenting Provider Last Dose Status Informant  acetaminophen (TYLENOL) 500 MG tablet [id number] No Take 500 mg by mouth in the morning and at bedtime. [provider] 04/24/2023 Active Family Member, Pharmacy Records  acetaminophen-codeine (TYLENOL #3) 300-30 MG tablet [id number] No Take 1 tablet by mouth every 8 (eight) hours as needed for moderate pain. Cammy Copa, MD Past Month Active Family Member, Pharmacy Records  albuterol (PROVENTIL) (2.5 MG/3ML) 0.083% nebulizer solution [id number] No Take 3 mLs (2.5 mg total) by nebulization every 6 (six) hours as needed for shortness of breath or wheezing. Carlisle Beers, FNP Past Week Active Family Member, Pharmacy Records  albuterol (VENTOLIN HFA) 108 (90 Base) MCG/ACT inhaler [id number] No USE 2 PUFFS EVERY 6 HOURS AS NEEDED FOR WHEEZING. Leslye Peer, MD Past Week Active Family Member,  Pharmacy Records  amLODipine (NORVASC) 10 MG tablet [id number] No Take 1 tablet (10 mg total) by mouth daily.  Patient taking differently: Take 10 mg by mouth at bedtime.   Osvaldo Shipper, MD 04/24/2023 Active Family Member, Pharmacy Records  atorvastatin (LIPITOR) 40 MG tablet [id number] No Take 1 tablet (40 mg total) by mouth daily. Nahser, Deloris Ping, MD 04/24/2023 Active Family Member, Pharmacy Records  atropine 1 % ophthalmic solution [id number] No Place 1 drop into both eyes 2 (two) times daily as needed. [provider] Past Month Active Family Member, Pharmacy Records  Azelastine-Fluticasone 137-50 MCG/ACT SUSP [id number] No PLACE ONE SPRAY IN EACH NOSTRIL TWICE DAILY (IN THE MORNING AND AT BEDTIME)  Patient taking differently: Place 1 spray into both nostrils in the morning and at bedtime.   Leslye Peer, MD 04/24/2023 Active Family Member, Pharmacy Records  Budeson-Glycopyrrol-Formoterol [facility] AEROSPHERE) 160-9-4.8 MCG/ACT AERO [id number] No INHALE 2 PUFFS INTO THE LUNGS TWICE DAILY, IN THE MORNING & AT BEDTIME. Leslye Peer, MD 04/24/2023 Active Family Member, Pharmacy Records  carvedilol (COREG) 12.5 MG tablet [id number] No Take 1 tablet (12.5 mg total) by mouth 2 (two) times daily. Nahser, Deloris Ping, MD 04/24/2023 Active Family Member, Pharmacy Records  cetirizine (ZYRTEC) 10 MG tablet [id number] No Take 10 mg by mouth daily as needed for allergies or rhinitis. [provider] [place] Active Family Member, Pharmacy Records  citalopram (CELEXA) 20 MG tablet [id number] No Take 1 tablet (20 mg total) by mouth every evening. [name], MD 04/24/2023 Active Family Member, Pharmacy Records  clopidogrel (PLAVIX) 75 MG tablet [id number] No TAKE ONE TABLET BY MOUTH ONCE DAILY. Eulis Foster, FNP 04/24/2023 Active Family Member, Pharmacy Records  diclofenac Sodium (VOLTAREN) 1 % GEL [id number]  No Apply 4 g topically 2 (two) times daily as needed (painful areas). Ellsworth Lennox, PA-C  Past Month Active Family Member, Pharmacy Records  doxazosin (CARDURA) 4 MG tablet [id number] No take ONE tab every 12 hours  Patient taking differently: Take 4 mg by mouth 2 (two) times daily.   Dorthula Nettles, DO 04/24/2023 Active Family Member, Pharmacy Records  ferrous sulfate 325 (65 FE) MG EC tablet [id number] No Take 1 tablet (325 mg total) by mouth daily with breakfast. [name], MD 04/24/2023 Active Family Member, Pharmacy Records  fluticasone [facility]) 50 MCG/ACT nasal spray [id number] No Place 2 sprays into both nostrils as needed for allergies or rhinitis. [provider] [place] Active Family Member, Pharmacy Records  folic acid (FOLVITE) 1 MG tablet [id number] No take ONE tab daily Eulis Foster, [place] 04/24/2023 Active Family Member, Pharmacy Records  furosemide (LASIX) 40 MG tablet [id number] No Take 1.5 tablets (60 mg total) by mouth 2 (two) times daily. Prince Rome [place], [place] 04/24/2023 Active Family Member, Pharmacy Records  hydrALAZINE (APRESOLINE) 100 MG tablet [id number] No Take 1 tablet (100 mg total) by mouth 3 (three) times daily. Nahser, Deloris Ping, MD 04/24/2023 Active Family Member, Pharmacy Records  isosorbide mononitrate (IMDUR) 30 MG 24 hr tablet [id number] No Take 1 tablet (30 mg total) by mouth daily. Prince Rome [place], [place] 04/24/2023 Active Family Member, Pharmacy Records  LORazepam (ATIVAN) 0.5 MG tablet [id number]  Take 1 tablet (0.5 mg total) by mouth 2 (two) times daily as needed. for anxiety Panosh, Neta Mends, MD  Active   metolazone (ZAROXOLYN) 2.5 MG tablet [id number] No Take 1 tablet (2.5 mg total) by mouth once a week. Take every Friday [place], Anderson Malta, FNP Past Week Active Family Member, Pharmacy Records  montelukast (SINGULAIR) 10 MG tablet [id number] No Take 1 tablet (10 mg total) by mouth daily. Leslye Peer, MD 04/24/2023 Active Family Member, Pharmacy Records  pantoprazole (PROTONIX) 40 MG tablet [id number] No Take 1 tablet (40 mg total) by mouth  daily. Leslye Peer, MD 04/24/2023 Active Family Member, Pharmacy Records  potassium chloride (KLOR-CON M) 10 MEQ tablet [id number] No Take 10 mEq by mouth daily. Take an extra 20 meq (two tablets) on Fridays (this is the day you take Metolazone) [provider] 04/24/2023 Active Family Member, Pharmacy Records  Patient taking differently:  Discontinued 11/06/22 0938 (Reorder) RESTASIS 0.05 % ophthalmic emulsion [id number] No Place 1 drop into both eyes 2 (two) times daily. [provider] 04/24/2023 Active Family Member, Pharmacy Records  sodium bicarbonate 650 MG tablet [id number] No take ONE tab twice a DAY  Patient taking differently: Take 650 mg by mouth 2 (two) times daily.   Eulis Foster, FNP 04/24/2023 Active Family Member, Pharmacy Records  Spacer/Aero-Holding Chambers (AEROCHAMBER PLUS) inhaler [id number] No Use as instructed  Patient taking differently: Use as instructed as needed   Domenick Gong, MD Taking Active Family Member, Pharmacy Records  vitamin B-12 (CYANOCOBALAMIN) 1000 MCG tablet [id number] No Take 1,000 mcg by mouth daily. [provider] 04/24/2023 Active Family Member, Pharmacy Records  Vitamin D, Cholecalciferol, 25 MCG (1000 UT) TABS [id number] No Take 1,000 Units by mouth daily. [provider] 04/24/2023 Active Family Member, Pharmacy Records            Home Care and Equipment/Supplies: Were Home Health Services Ordered?: NA Any new equipment or medical supplies ordered?: NA  Functional Questionnaire: Do you need assistance with bathing/showering or dressing?: No Do you need assistance with meal preparation?:  No Do you need assistance with eating?: No Do you have difficulty maintaining continence: No Do you need assistance with getting out of bed/getting out of a chair/moving?: No Do you have difficulty managing or taking your medications?: No  Follow up appointments reviewed: PCP Follow-up appointment confirmed?: No  (no avai appt , sent message to staff to schedule) MD Provider Line Number:[phone] Given: No Specialist Hospital Follow-up appointment confirmed?: NA Do you need transportation to your follow-up appointment?: No Do you understand care options if your condition(s) worsen?: Yes-patient verbalized understanding    SIGNATURE Karena Addison, LPN [facility] Nurse Health Advisor Direct Dial [phone]

## 2023-04-26 NOTE — Telephone Encounter (Signed)
[name] with para medicine requests provider to review 12/11 ER visit. Multiple abnormal EKG's and elevated troponin levels.  -will pt require follow up with cardiology

## 2023-05-01 ENCOUNTER — Other Ambulatory Visit (HOSPITAL_COMMUNITY): Payer: Self-pay

## 2023-05-01 NOTE — Progress Notes (Signed)
Paramedicine Encounter    Patient ID: [name], female    DOB: [birth year], 89 y.o.   MRN: [id number]   Complaints-none   Edema-yes always to legs   Compliance with meds-yes  Pill box filled-yes X 2 wks  If so, by whom-paramedic   Refills needed-clopidogrel--needs in 2nd wk --called that refill in today   Pt was seen in the ER last week post clinic for h/a and dizziness. They gave her some medication and per family it was felt that it was related to some meds she is taking. Takes tylenol with relief.  Still getting some h/a on occasion. Pt denies any more fluttering or palpitations recently. She denies increased sob.      Meds verified and 2 wks pill boxes refilled since next week is christmas and ill be off about all wk. She is missing the clopidogrel in 2nd wk. Will come back on Monday next week to place where needed.   BP (!) 160/60   Pulse 64   Resp 16   Wt 142 lb (64.4 kg)   SpO2 97%   BMI 25.15 kg/m  Weight yesterday-? Last visit weight-146  Patient Care Team: Panosh, Neta Mends, MD as PCP - General (Internal Medicine) Nahser, Deloris Ping, MD as PCP - Cardiology (Cardiology) August Saucer, Corrie Mckusick, MD (Orthopedic Surgery) Malachy Mood, MD as Consulting Physician (Hematology) Bufford Buttner, MD as Consulting Physician (Nephrology) Mateo Flow, MD as Consulting Physician (Ophthalmology) Eber Jones, MD as Referring Physician (Ophthalmology) Delton Coombes Les Pou, MD as Consulting Physician (Pulmonary Disease) Sherrill Raring, [facility] (Pharmacist)  Patient Active Problem List   Diagnosis Date Noted   CAP (community acquired pneumonia) 10/31/2022   Diastolic congestive heart failure (HCC) 10/31/2022   Hypertensive urgency 10/31/2022   Moderate persistent asthma 10/31/2022   Hypomagnesemia 10/31/2022   CKD (chronic kidney disease), stage IV (HCC) 10/31/2022   Pulmonary hypertension, unspecified (HCC) 12/06/2021   Chronic cough 10/18/2021   Chronic rhinitis 11/24/2019    Nausea 11/03/2018   Elevated troponin 11/03/2018   Hyponatremia 11/03/2018   Hypertensive heart disease with hypertensive chronic kidney disease 11/30/2017   Fasting hyperglycemia 11/30/2017   Renal cyst 03/27/2016   CKD (chronic kidney disease) stage 3, GFR 30-59 ml/min (HCC) 08/23/2015   Subjective hearing change 06/29/2014   Resistant hypertension 06/29/2014   Lumbago 06/15/2014   Pre-diabetes vs early DM  06/15/2014   Dyspnea 04/27/2014   Asthma, chronic 04/13/2014   Elevated uric acid in blood 04/13/2014   Essential hypertension 03/27/2014   History of DVT (deep vein thrombosis)    Palpitations 01/05/2014   Anemia of chronic disease 01/04/2014   Death of family member 09/08/2013   Leg edema 06/20/2013   Bereavement due to life event 04/21/2013   Other dysphagia 02/11/2013   Colon cancer screening 02/11/2013   Anxiety state 01/20/2013   Leg cramps 01/20/2013   Back pain 12/05/2012   Family hx of colon cancer 10/21/2012   Family hx of lung cancer 10/21/2012   Family hx-breast malignancy 10/21/2012   Renal insufficiency creatinine 1.3 10/21/2012   Anemia, chronic disease 10/21/2012   GERD (gastroesophageal reflux disease) 09/08/2012   HTN (hypertension) 09/08/2012   Hyperlipidemia 09/08/2012   Varicose veins of leg with complications 12/05/2010    Current Outpatient Medications:    acetaminophen (TYLENOL) 500 MG tablet, Take 500 mg by mouth in the morning and at bedtime., Disp: , Rfl:    acetaminophen-codeine (TYLENOL #3) 300-30 MG tablet, Take 1 tablet by mouth every  8 (eight) hours as needed for moderate pain., Disp: 25 tablet, Rfl: 0   albuterol (PROVENTIL) (2.5 MG/3ML) 0.083% nebulizer solution, Take 3 mLs (2.5 mg total) by nebulization every 6 (six) hours as needed for shortness of breath or wheezing., Disp: 360 mL, Rfl: 6   albuterol (VENTOLIN HFA) 108 (90 Base) MCG/ACT inhaler, USE 2 PUFFS EVERY 6 HOURS AS NEEDED FOR WHEEZING., Disp: 8.5 g, Rfl: 3   amLODipine  (NORVASC) 10 MG tablet, Take 1 tablet (10 mg total) by mouth daily. (Patient taking differently: Take 10 mg by mouth at bedtime.), Disp: 30 tablet, Rfl: 1   atorvastatin (LIPITOR) 40 MG tablet, Take 1 tablet (40 mg total) by mouth daily., Disp: 90 tablet, Rfl: 3   atropine 1 % ophthalmic solution, Place 1 drop into both eyes 2 (two) times daily as needed., Disp: , Rfl:    Azelastine-Fluticasone 137-50 MCG/ACT SUSP, PLACE ONE SPRAY IN EACH NOSTRIL TWICE DAILY (IN THE MORNING AND AT BEDTIME) (Patient taking differently: Place 1 spray into both nostrils in the morning and at bedtime.), Disp: 23 g, Rfl: 6   Budeson-Glycopyrrol-Formoterol (BREZTRI AEROSPHERE) 160-9-4.8 MCG/ACT AERO, INHALE 2 PUFFS INTO THE LUNGS TWICE DAILY, IN THE MORNING & AT BEDTIME., Disp: 10.7 g, Rfl: 11   carvedilol (COREG) 12.5 MG tablet, Take 1 tablet (12.5 mg total) by mouth 2 (two) times daily., Disp: 180 tablet, Rfl: 3   cetirizine (ZYRTEC) 10 MG tablet, Take 10 mg by mouth daily as needed for allergies or rhinitis., Disp: , Rfl:    citalopram (CELEXA) 20 MG tablet, Take 1 tablet (20 mg total) by mouth every evening., Disp: 90 tablet, Rfl: 0   clopidogrel (PLAVIX) 75 MG tablet, TAKE ONE TABLET BY MOUTH ONCE DAILY., Disp: 30 tablet, Rfl: 1   doxazosin (CARDURA) 4 MG tablet, take ONE tab every 12 hours (Patient taking differently: Take 4 mg by mouth 2 (two) times daily.), Disp: 60 tablet, Rfl: 0   ferrous sulfate 325 (65 FE) MG EC tablet, Take 1 tablet (325 mg total) by mouth daily with breakfast., Disp: 30 tablet, Rfl: 3   folic acid (FOLVITE) 1 MG tablet, take ONE tab daily, Disp: 30 tablet, Rfl: 0   furosemide (LASIX) 40 MG tablet, Take 1.5 tablets (60 mg total) by mouth 2 (two) times daily., Disp: 270 tablet, Rfl: 3   hydrALAZINE (APRESOLINE) 100 MG tablet, Take 1 tablet (100 mg total) by mouth 3 (three) times daily., Disp: 270 tablet, Rfl: 0   isosorbide mononitrate (IMDUR) 30 MG 24 hr tablet, Take 1 tablet (30 mg total) by  mouth daily., Disp: 90 tablet, Rfl: 3   metolazone (ZAROXOLYN) 2.5 MG tablet, Take 1 tablet (2.5 mg total) by mouth once a week. Take every Friday, Disp: 15 tablet, Rfl: 3   montelukast (SINGULAIR) 10 MG tablet, Take 1 tablet (10 mg total) by mouth daily. (Patient taking differently: Take 10 mg by mouth at bedtime.), Disp: 90 tablet, Rfl: 3   pantoprazole (PROTONIX) 40 MG tablet, Take 1 tablet (40 mg total) by mouth daily. (Patient taking differently: Take 40 mg by mouth 2 (two) times daily.), Disp: 90 tablet, Rfl: 3   potassium chloride (KLOR-CON M) 10 MEQ tablet, Take 10 mEq by mouth daily. Take an extra 20 meq (two tablets) on Fridays (this is the day you take Metolazone), Disp: , Rfl:    RESTASIS 0.05 % ophthalmic emulsion, Place 1 drop into both eyes 2 (two) times daily., Disp: , Rfl:    sodium bicarbonate 650  MG tablet, take ONE tab twice a DAY (Patient taking differently: Take 650 mg by mouth 2 (two) times daily.), Disp: 60 tablet, Rfl: 0   vitamin B-12 (CYANOCOBALAMIN) 1000 MCG tablet, Take 1,000 mcg by mouth daily., Disp: , Rfl:    Vitamin D, Cholecalciferol, 25 MCG (1000 UT) TABS, Take 1,000 Units by mouth daily., Disp: , Rfl:    diclofenac Sodium (VOLTAREN) 1 % GEL, Apply 4 g topically 2 (two) times daily as needed (painful areas)., Disp: 50 g, Rfl: 1   fluticasone (FLONASE) 50 MCG/ACT nasal spray, Place 2 sprays into both nostrils as needed for allergies or rhinitis., Disp: , Rfl:    LORazepam (ATIVAN) 0.5 MG tablet, Take 1 tablet (0.5 mg total) by mouth 2 (two) times daily as needed. for anxiety, Disp: 30 tablet, Rfl: 0   Spacer/Aero-Holding Chambers (AEROCHAMBER PLUS) inhaler, Use as instructed (Patient taking differently: Use as instructed as needed), Disp: 1 each, Rfl: 2 Allergies  Allergen Reactions   Penicillins Nausea And Vomiting and Other (See Comments)    Has patient had a PCN reaction causing immediate rash, facial/tongue/throat swelling, SOB or lightheadedness with  hypotension: Y Has patient had a PCN reaction causing severe rash involving mucus membranes or skin necrosis: Y Has patient had a PCN reaction that required hospitalization: N Has patient had a PCN reaction occurring within the last 10 years: N If all of the above answers are "NO", then may proceed with Cephalosporin use.    Aspirin Other (See Comments)    Wheezing Acetaminophen is OK       Social History   Socioeconomic History   Marital status: Single    Spouse name: Not on file   Number of children: 6   Years of education: Not on file   Highest education level: Not on file  Occupational History   Occupation: retired  Tobacco Use   Smoking status: Never   Smokeless tobacco: Never  Vaping Use   Vaping status: Never Used  Substance and Sexual Activity   Alcohol use: No   Drug use: No   Sexual activity: Not Currently    Birth control/protection: Surgical  Other Topics Concern   Not on file  Social History Narrative   2 people living in the home.  Grand daughter.   Up and down through the night      Had 7 children  2 deceased . 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    works 3 days per week currently .   Glasses dentures  Neg tad    Social Drivers of Health   Financial Resource Strain: Low Risk  (04/14/2022)   Overall Financial Resource Strain (CARDIA)    Difficulty of Paying Living Expenses: Not very hard  Food Insecurity: No Food Insecurity (12/01/2022)   Hunger Vital Sign    Worried About Running Out of Food in the Last Year: Never true    Ran Out of Food in the Last Year: Never true  Transportation Needs: No Transportation Needs (12/01/2022)   PRAPARE - Administrator, Civil Service (Medical): No    Lack of Transportation (Non-Medical): No  Physical Activity: Not on file  Stress: Not on file  Social Connections: Not on file  Intimate Partner Violence: Not At Risk  (10/31/2022)   Humiliation, Afraid, Rape, and Kick questionnaire    Fear of Current or Ex-Partner: No  Emotionally Abused: No    Physically Abused: No    Sexually Abused: No    Physical Exam      Future Appointments  Date Time Provider Department Center  05/02/2023  4:00 PM [name], MD [facility]  05/03/2023  3:00 PM [facility] None  05/22/2023  1:00 PM [facility] None  05/22/2023  1:45 PM [facility] FLUSH [facility] None  06/19/2023  1:00 PM [facility] None  06/19/2023  1:45 PM [facility] FLUSH [facility] None  07/17/2023  1:00 PM [facility] None  07/17/2023  1:30 PM Carlean Jews, NP [facility] None  07/17/2023  2:15 PM [facility] FLUSH [facility] None       Kerry Hough, Paramedic [phone] [facility] Paramedic  05/01/23

## 2023-05-02 ENCOUNTER — Ambulatory Visit: Payer: Medicare Other | Admitting: Internal Medicine

## 2023-05-02 ENCOUNTER — Encounter: Payer: Self-pay | Admitting: Internal Medicine

## 2023-05-02 VITALS — BP 178/78 | HR 68 | Temp 97.8°F | Ht 63.0 in | Wt 142.6 lb

## 2023-05-02 DIAGNOSIS — I130 Hypertensive heart and chronic kidney disease with heart failure and stage 1 through stage 4 chronic kidney disease, or unspecified chronic kidney disease: Secondary | ICD-10-CM | POA: Diagnosis not present

## 2023-05-02 DIAGNOSIS — N184 Chronic kidney disease, stage 4 (severe): Secondary | ICD-10-CM | POA: Diagnosis not present

## 2023-05-02 DIAGNOSIS — R519 Headache, unspecified: Secondary | ICD-10-CM

## 2023-05-02 DIAGNOSIS — I1A0 Resistant hypertension: Secondary | ICD-10-CM

## 2023-05-02 DIAGNOSIS — Z79899 Other long term (current) drug therapy: Secondary | ICD-10-CM | POA: Diagnosis not present

## 2023-05-02 MED ORDER — OXYCODONE HCL 5 MG PO TABS: 5.0000 mg | ORAL_TABLET | Freq: Four times a day (QID) | ORAL | 0 refills | Status: DC | PRN

## 2023-05-02 MED ORDER — CLONIDINE HCL 0.1 MG PO TABS: 0.1000 mg | ORAL_TABLET | Freq: Two times a day (BID) | ORAL | 0 refills | Status: DC

## 2023-05-02 NOTE — Progress Notes (Signed)
Chief Complaint  Patient presents with   Hospitalization Follow-up    Pt report headache and has not taken BP med today.     HPI: [name] 89 y.o. come in forFu hospital  visit 12 11  ED visiti for dizziness  Headache  persistent without assoc sx   Ct jhead negative for acute problem MRi no acute finding  Was given IV regalna dn benadryl? Some better  but on going headache top of head all over witout nv vision change . Reminiscent of ha remote past none for years   Family feels since had memory not as sharp  No rencet lorazepam taking tylenol  Newere med ? November imdur 30 mg     Followed chf clinic outreach  and nephrology  bp  up yesterday   Recnet had been down to 140+ range  Had  ekfs and elevated troponins  at ed  bp noted 160/60 range   Last renal check no change in  meds  cr in sytem in 3 range .    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  ROS: See pertinent positives and negatives per HPI.  Past Medical History:  Diagnosis Date   Acute superficial venous thrombosis of left lower extremity 03/27/2014   concern about hx and potential extensive on exam tenderness calf and low dose lovenox 40 qd 2-4 weekselevetion and close  fu advised .    Allergy    Anemia    Anxiety    Arthritis    "shoulders" (09/09/2012)   Asthma 09/08/2012   Carotid bruit    [place] 2018  low risk 1 - 39%   Cataract    bil cateracts removed   Chronic kidney disease    Clotting disorder (HCC)    blood clots in legs   Diabetes mellitus without complication (HCC)    "Borderline" per pt; being monitored.  no meds per pt   Diverticulosis    Dyspnea 04/27/2014   GERD (gastroesophageal reflux disease)    Headache(784.0)    "related to my high blood pressure" (09/09/2012)   Hiatal hernia    History of DVT (deep vein thrombosis)    "RLE" (09/09/2012) coumadine cant take asa so on plavix    HTN (hypertension) 09/08/2012   Hyperlipidemia    Lupus    "cured years ago" (09/09/2012)   Other dysphagia  02/11/2013   Syncope and collapse 01/21/2018   Varicose veins    Varicose veins of leg with complications 12/05/2010    Family History  Problem Relation Age of Onset   Hypertension Mother    Lung cancer Mother    Hypertension Father    Lung cancer Father    Breast cancer Sister    Colon cancer Sister        ? 40' s dx - died in 66's   Breast cancer Sister    Hypertension Brother    Rectal cancer Brother    Stomach cancer Brother    Breast cancer Brother    Heart attack Daughter    Esophageal cancer Daughter    Hypertension Daughter    Diabetes Daughter    Breast cancer Paternal Aunt    Lung cancer Other        both parents    Pancreatic cancer Neg Hx     Social History   Socioeconomic History   Marital status: Single    Spouse name: Not on file   Number of children: 6   Years of education:  Not on file   Highest education level: Not on file  Occupational History   Occupation: retired  Tobacco Use   Smoking status: Never   Smokeless tobacco: Never  Vaping Use   Vaping status: Never Used  Substance and Sexual Activity   Alcohol use: No   Drug use: No   Sexual activity: Not Currently    Birth control/protection: Surgical  Other Topics Concern   Not on file  Social History Narrative   2 people living in the home.  Grand daughter.   Up and down through the night      Had 7 children  2 deceased . 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    works 3 days per week currently .   Glasses dentures  Neg tad    Social Drivers of Health   Financial Resource Strain: Low Risk  (04/14/2022)   Overall Financial Resource Strain (CARDIA)    Difficulty of Paying Living Expenses: Not very hard  Food Insecurity: No Food Insecurity (12/01/2022)   Hunger Vital Sign    Worried About Running Out of Food in the Last Year: Never true    Ran Out of Food in the Last Year: Never true  Transportation Needs:  No Transportation Needs (12/01/2022)   PRAPARE - Administrator, Civil Service (Medical): No    Lack of Transportation (Non-Medical): No  Physical Activity: Not on file  Stress: Not on file  Social Connections: Not on file    Outpatient Medications Prior to Visit  Medication Sig Dispense Refill   acetaminophen (TYLENOL) 500 MG tablet Take 500 mg by mouth in the morning and at bedtime.     albuterol (PROVENTIL) (2.5 MG/3ML) 0.083% nebulizer solution Take 3 mLs (2.5 mg total) by nebulization every 6 (six) hours as needed for shortness of breath or wheezing. 360 mL 6   albuterol (VENTOLIN HFA) 108 (90 Base) MCG/ACT inhaler USE 2 PUFFS EVERY 6 HOURS AS NEEDED FOR WHEEZING. 8.5 g 3   amLODipine (NORVASC) 10 MG tablet Take 1 tablet (10 mg total) by mouth daily. (Patient taking differently: Take 10 mg by mouth at bedtime.) 30 tablet 1   atorvastatin (LIPITOR) 40 MG tablet Take 1 tablet (40 mg total) by mouth daily. 90 tablet 3   atropine 1 % ophthalmic solution Place 1 drop into both eyes 2 (two) times daily as needed.     Azelastine-Fluticasone 137-50 MCG/ACT SUSP PLACE ONE SPRAY IN EACH NOSTRIL TWICE DAILY (IN THE MORNING AND AT BEDTIME) (Patient taking differently: Place 1 spray into both nostrils in the morning and at bedtime.) 23 g 6   Budeson-Glycopyrrol-Formoterol (BREZTRI AEROSPHERE) 160-9-4.8 MCG/ACT AERO INHALE 2 PUFFS INTO THE LUNGS TWICE DAILY, IN THE MORNING & AT BEDTIME. 10.7 g 11   carvedilol (COREG) 12.5 MG tablet Take 1 tablet (12.5 mg total) by mouth 2 (two) times daily. 180 tablet 3   cetirizine (ZYRTEC) 10 MG tablet Take 10 mg by mouth daily as needed for allergies or rhinitis.     citalopram (CELEXA) 20 MG tablet Take 1 tablet (20 mg total) by mouth every evening. 90 tablet 0   clopidogrel (PLAVIX) 75 MG tablet TAKE ONE TABLET BY MOUTH ONCE DAILY. 30 tablet 1   diclofenac Sodium (VOLTAREN) 1 % GEL Apply 4 g topically 2 (two) times daily as needed (painful areas). 50 g 1    doxazosin (CARDURA)  4 MG tablet take ONE tab every 12 hours (Patient taking differently: Take 4 mg by mouth 2 (two) times daily.) 60 tablet 0   ferrous sulfate 325 (65 FE) MG EC tablet Take 1 tablet (325 mg total) by mouth daily with breakfast. 30 tablet 3   fluticasone (FLONASE) 50 MCG/ACT nasal spray Place 2 sprays into both nostrils as needed for allergies or rhinitis.     folic acid (FOLVITE) 1 MG tablet take ONE tab daily 30 tablet 0   furosemide (LASIX) 40 MG tablet Take 1.5 tablets (60 mg total) by mouth 2 (two) times daily. 270 tablet 3   hydrALAZINE (APRESOLINE) 100 MG tablet Take 1 tablet (100 mg total) by mouth 3 (three) times daily. 270 tablet 0   isosorbide mononitrate (IMDUR) 30 MG 24 hr tablet Take 1 tablet (30 mg total) by mouth daily. 90 tablet 3   LORazepam (ATIVAN) 0.5 MG tablet Take 1 tablet (0.5 mg total) by mouth 2 (two) times daily as needed. for anxiety 30 tablet 0   metolazone (ZAROXOLYN) 2.5 MG tablet Take 1 tablet (2.5 mg total) by mouth once a week. Take every Friday 15 tablet 3   montelukast (SINGULAIR) 10 MG tablet Take 1 tablet (10 mg total) by mouth daily. (Patient taking differently: Take 10 mg by mouth at bedtime.) 90 tablet 3   pantoprazole (PROTONIX) 40 MG tablet Take 1 tablet (40 mg total) by mouth daily. (Patient taking differently: Take 40 mg by mouth 2 (two) times daily.) 90 tablet 3   potassium chloride (KLOR-CON M) 10 MEQ tablet Take 10 mEq by mouth daily. Take an extra 20 meq (two tablets) on Fridays (this is the day you take Metolazone)     RESTASIS 0.05 % ophthalmic emulsion Place 1 drop into both eyes 2 (two) times daily.     sodium bicarbonate 650 MG tablet take ONE tab twice a DAY (Patient taking differently: Take 650 mg by mouth 2 (two) times daily.) 60 tablet 0   Spacer/Aero-Holding Chambers (AEROCHAMBER PLUS) inhaler Use as instructed (Patient taking differently: Use as instructed as needed) 1 each 2   vitamin B-12 (CYANOCOBALAMIN) 1000 MCG tablet  Take 1,000 mcg by mouth daily.     Vitamin D, Cholecalciferol, 25 MCG (1000 UT) TABS Take 1,000 Units by mouth daily.     acetaminophen-codeine (TYLENOL #3) 300-30 MG tablet Take 1 tablet by mouth every 8 (eight) hours as needed for moderate pain. 25 tablet 0   No facility-administered medications prior to visit.     EXAM:  BP (!) 178/78 (BP Location: Right Arm, Patient Position: Sitting, Cuff Size: Normal)   Pulse 68   Temp 97.8 F (36.6 C) (Oral)   Ht 5\' 3"  (1.6 m)   Wt 142 lb 9.6 oz (64.7 kg)   SpO2 98%   BMI 25.26 kg/m   Body mass index is 25.26 kg/m.  GENERAL: vitals reviewed and listed above, alert, oriented, appears well hydrated and in no acute distress uncomfortable  eoms full  direction full  HEENT: atraumatic, conjunctiva  clear, no obvious abnormalities on inspection of external nose and ears  NECK: no obvious masses on inspection palpation  LUNGS: clear to auscultation bilaterally, no wheezes, rales or rhonchi, good air movement CV: HRRR, no clubbing cyanosis  compression socks on edema stable  MS: moves all extremities without noticeable focal  abnormality PSYCH: pleasant and cooperative, no obvious depression or anxiety nl speech  Lab Results  Component Value Date   WBC 3.3 (L)  04/25/2023   HGB 11.1 (L) 04/25/2023   HCT 36.4 04/25/2023   PLT 167 04/25/2023   GLUCOSE 116 (H) 04/25/2023   CHOL 190 07/21/2022   TRIG 97 07/21/2022   HDL 92 07/21/2022   LDLCALC 81 07/21/2022   ALT 12 03/13/2023   AST 22 03/13/2023   NA 135 04/25/2023   K 3.8 04/25/2023   CL 103 04/25/2023   CREATININE 3.93 (H) 04/25/2023   BUN 46 (H) 04/25/2023   CO2 24 04/25/2023   TSH 2.85 06/29/2022   INR 1.0 10/31/2022   HGBA1C 5.3 06/29/2022   MICROALBUR 119 03/02/2021   BP Readings from Last 3 Encounters:  05/02/23 (!) 178/78  05/01/23 (!) 160/60  04/25/23 (!) 159/60   Ed visit review     ASSESSMENT AND PLAN:  Discussed the following assessment and plan:  Headache,  unspecified headache type  Medication management  Chronic kidney disease (CKD), stage IV (severe) (HCC)  Hypertensive heart disease with congestive heart failure and chronic kidney disease, unspecified CKD stage, unspecified heart failure type (HCC)  Resistant hypertension On going ha    new onset  uncertain cause  ? If imdur triggered other  Bp is higher   ? Related cause effect.  Med managed by cards  mostly  I will reach out to them about managing meds  and need for imdurin case causing ha .  Other ideas about her HT  Gave printed rx for clonidine 0.1 in bid  in case  bp staying 180 and above  Try oxycodone  for short term relief ( not to do ref ) short term  and rest and see if bp comes down and pain .  Plan involved prescribing teams  about  meds bp and headaches .[name]  Plan fu visit 1 month or as needed in interm  Time  review counsel  order plan reagding new ha  complicated med list  reach out for advice to team  50  minutes  -Patient advised to return or notify health care team  if  new concerns arise.  Patient Instructions  I am not sure why you are having such headaches  although I supposed medications side effects could trigger your old migraines.    Sending in some oxycodone for short term  pain management with caution. Also  want your team  renal and cards to help with the BP management and  opinion to see if the imdur could be adding to trigger headaches.     Neta Mends. Satchel Heidinger M.D.

## 2023-05-02 NOTE — Patient Instructions (Addendum)
I am not sure why you are having such headaches  although I supposed medications side effects could trigger your old migraines.    Sending in some oxycodone for short term  pain management with caution. Also  want your team  renal and cards to help with the BP management and  opinion to see if the imdur could be adding to trigger headaches.

## 2023-05-03 ENCOUNTER — Ambulatory Visit (HOSPITAL_COMMUNITY)
Admission: RE | Admit: 2023-05-03 | Discharge: 2023-05-03 | Disposition: A | Discharge status: Home / Self Care | Payer: Medicare Other | Source: Ambulatory Visit | Attending: Cardiology | Admitting: Cardiology

## 2023-05-03 DIAGNOSIS — I5032 Chronic diastolic (congestive) heart failure: Secondary | ICD-10-CM | POA: Insufficient documentation

## 2023-05-03 LAB — BASIC METABOLIC PANEL
Anion gap: 12 (ref 5–15)
BUN: 38 mg/dL — ABNORMAL HIGH (ref 8–23)
CO2: 22 mmol/L (ref 22–32)
Calcium: 9.1 mg/dL (ref 8.9–10.3)
Chloride: 109 mmol/L (ref 98–111)
Creatinine, Ser: 3.89 mg/dL — ABNORMAL HIGH (ref 0.44–1.00)
GFR, Estimated: 11 mL/min — ABNORMAL LOW (ref >60)
Glucose, Bld: 108 mg/dL — ABNORMAL HIGH (ref 70–99)
Potassium: 4.5 mmol/L (ref 3.5–5.1)
Sodium: 143 mmol/L (ref 135–145)

## 2023-05-03 LAB — BRAIN NATRIURETIC PEPTIDE: B Natriuretic Peptide: 550.7 pg/mL — ABNORMAL HIGH (ref 0.0–100.0)

## 2023-05-07 ENCOUNTER — Telehealth (HOSPITAL_COMMUNITY): Payer: Self-pay

## 2023-05-07 DIAGNOSIS — I5032 Chronic diastolic (congestive) heart failure: Secondary | ICD-10-CM

## 2023-05-07 NOTE — Telephone Encounter (Signed)
New order placed

## 2023-05-07 NOTE — Telephone Encounter (Signed)
-----   Message from Nurse Herbert Seta S sent at 04/25/2023  4:10 PM EST ----- Eileen Stanford can you change the order to be done at [address] [place]avy Cella does that affect the auth? ----- Message ----- From: Modesta Messing, CMA Sent: 04/24/2023   1:43 PM EST To: Noralee Space, RN; Baird Cancer, RN  Authorization Number: [id number]  Expiration Date: 06/08/2023 ----- Message ----- From: Noralee Space, RN Sent: 04/24/2023  12:30 PM EST To: Modesta Messing, CMA; Baird Cancer, RN  Adi ordered pt a PYP on 7/26 and it has never been done, look like auth still "Pend" never approved, can we please get this approved and scheduled asap  She has [facility] Medicare

## 2023-05-15 ENCOUNTER — Other Ambulatory Visit (HOSPITAL_COMMUNITY): Payer: Self-pay

## 2023-05-15 NOTE — Progress Notes (Signed)
 Paramedicine Encounter    Patient ID: [name], female    DOB: 26-Aug-1933, 89 y.o.   MRN: [id number]   Complaints-h/a occasionally   Edema-yes per usual   Compliance with meds-yes  Pill box filled-yes If so, by whom-paramedic   Refills needed-hydralazine , metolazone , vit d 3 --grand-d asked me to tell her what is needed and she will get it called in   Finally was able to reach pts family to sch home visit for today.  She was seen by PCP last week and her b/p still elevated and they started clonidine . She did not start that yet. So will be starting that today.  Pt reports some h/a occasionally, she denies increased sob, no dizziness, denies any palpitations.  The last few visits pt has been asking/stating repetitive things. I did send message to grand-d to see if this has been continuing for the family also, but I hadnt heard back anything yet.   Meds verified and pill box refilled.  Will see her again next week.    BP (!) 140/60   Pulse 60   Resp 16   Wt 144 lb (65.3 kg)   SpO2 98%   BMI 25.51 kg/m  Weight yesterday-? Last visit weight-142  Patient Care Team: Panosh, Apolinar POUR, MD as PCP - General (Internal Medicine) Nahser, Aleene PARAS, MD as PCP - Cardiology (Cardiology) Addie, Cordella Hamilton, MD (Orthopedic Surgery) Lanny Callander, MD as Consulting Physician (Hematology) Gearline Norris, MD as Consulting Physician (Nephrology) Cleatus Collar, MD as Consulting Physician (Ophthalmology) Maree Paticia BRAVO, MD as Referring Physician (Ophthalmology) Shelah Lamar RAMAN, MD as Consulting Physician (Pulmonary Disease) Lionell Jon DEL, RPH (Pharmacist) Claudette, Md, MD (Cardiology) Gardenia Led, DO as Consulting Physician (Cardiology)  Patient Active Problem List   Diagnosis Date Noted   CAP (community acquired pneumonia) 10/31/2022   Diastolic congestive heart failure (HCC) 10/31/2022   Hypertensive urgency 10/31/2022   Moderate persistent asthma 10/31/2022   Hypomagnesemia  10/31/2022   CKD (chronic kidney disease), stage IV (HCC) 10/31/2022   Pulmonary hypertension, unspecified (HCC) 12/06/2021   Chronic cough 10/18/2021   Chronic rhinitis 11/24/2019   Nausea 11/03/2018   Elevated troponin 11/03/2018   Hyponatremia 11/03/2018   Hypertensive heart disease with hypertensive chronic kidney disease 11/30/2017   Fasting hyperglycemia 11/30/2017   Renal cyst 03/27/2016   CKD (chronic kidney disease) stage 3, GFR 30-59 ml/min (HCC) 08/23/2015   Subjective hearing change 06/29/2014   Resistant hypertension 06/29/2014   Lumbago 06/15/2014   Pre-diabetes vs early DM  06/15/2014   Dyspnea 04/27/2014   Asthma, chronic 04/13/2014   Elevated uric acid in blood 04/13/2014   Essential hypertension 03/27/2014   History of DVT (deep vein thrombosis)    Palpitations 01/05/2014   Anemia of chronic disease 2014-01-14   Death of family member 18-Sep-2013   Leg edema 06/20/2013   Bereavement due to life event 04/21/2013   Other dysphagia 02/11/2013   Colon cancer screening 02/11/2013   Anxiety state 01/20/2013   Leg cramps 01/20/2013   Back pain 12/05/2012   Family hx of colon cancer 10/21/2012   Family hx of lung cancer 10/21/2012   Family hx-breast malignancy 10/21/2012   Renal insufficiency creatinine 1.3 10/21/2012   Anemia, chronic disease 10/21/2012   GERD (gastroesophageal reflux disease) 09/08/2012   HTN (hypertension) 09/08/2012   Hyperlipidemia 09/08/2012   Varicose veins of leg with complications 12/05/2010    Current Outpatient Medications:    acetaminophen  (TYLENOL ) 500 MG tablet, Take 500 mg by  mouth in the morning and at bedtime., Disp: , Rfl:    albuterol  (PROVENTIL ) (2.5 MG/3ML) 0.083% nebulizer solution, Take 3 mLs (2.5 mg total) by nebulization every 6 (six) hours as needed for shortness of breath or wheezing., Disp: 360 mL, Rfl: 6   albuterol  (VENTOLIN  HFA) 108 (90 Base) MCG/ACT inhaler, USE 2 PUFFS EVERY 6 HOURS AS NEEDED FOR WHEEZING., Disp:  8.5 g, Rfl: 3   amLODipine  (NORVASC ) 10 MG tablet, Take 1 tablet (10 mg total) by mouth daily. (Patient taking differently: Take 10 mg by mouth at bedtime.), Disp: 30 tablet, Rfl: 1   atorvastatin  (LIPITOR) 40 MG tablet, Take 1 tablet (40 mg total) by mouth daily., Disp: 90 tablet, Rfl: 3   Budeson-Glycopyrrol-Formoterol  (BREZTRI  AEROSPHERE) 160-9-4.8 MCG/ACT AERO, INHALE 2 PUFFS INTO THE LUNGS TWICE DAILY, IN THE MORNING & AT BEDTIME., Disp: 10.7 g, Rfl: 11   carvedilol  (COREG ) 12.5 MG tablet, Take 1 tablet (12.5 mg total) by mouth 2 (two) times daily., Disp: 180 tablet, Rfl: 3   cetirizine (ZYRTEC) 10 MG tablet, Take 10 mg by mouth daily as needed for allergies or rhinitis., Disp: , Rfl:    citalopram  (CELEXA ) 20 MG tablet, Take 1 tablet (20 mg total) by mouth every evening., Disp: 90 tablet, Rfl: 0   cloNIDine  (CATAPRES ) 0.1 MG tablet, Take 1 tablet (0.1 mg total) by mouth 2 (two) times daily. If needed for hypertension, Disp: 60 tablet, Rfl: 0   clopidogrel  (PLAVIX ) 75 MG tablet, TAKE ONE TABLET BY MOUTH ONCE DAILY., Disp: 30 tablet, Rfl: 1   doxazosin  (CARDURA ) 4 MG tablet, take ONE tab every 12 hours (Patient taking differently: Take 4 mg by mouth 2 (two) times daily.), Disp: 60 tablet, Rfl: 0   ferrous sulfate  325 (65 FE) MG EC tablet, Take 1 tablet (325 mg total) by mouth daily with breakfast., Disp: 30 tablet, Rfl: 3   fluticasone  (FLONASE ) 50 MCG/ACT nasal spray, Place 2 sprays into both nostrils as needed for allergies or rhinitis., Disp: , Rfl:    folic acid  (FOLVITE ) 1 MG tablet, take ONE tab daily, Disp: 30 tablet, Rfl: 0   furosemide  (LASIX ) 40 MG tablet, Take 1.5 tablets (60 mg total) by mouth 2 (two) times daily., Disp: 270 tablet, Rfl: 3   hydrALAZINE  (APRESOLINE ) 100 MG tablet, Take 1 tablet (100 mg total) by mouth 3 (three) times daily., Disp: 270 tablet, Rfl: 0   isosorbide  mononitrate (IMDUR ) 30 MG 24 hr tablet, Take 1 tablet (30 mg total) by mouth daily., Disp: 90 tablet, Rfl:  3   LORazepam  (ATIVAN ) 0.5 MG tablet, Take 1 tablet (0.5 mg total) by mouth 2 (two) times daily as needed. for anxiety, Disp: 30 tablet, Rfl: 0   metolazone  (ZAROXOLYN ) 2.5 MG tablet, Take 1 tablet (2.5 mg total) by mouth once a week. Take every Friday, Disp: 15 tablet, Rfl: 3   montelukast  (SINGULAIR ) 10 MG tablet, Take 1 tablet (10 mg total) by mouth daily. (Patient taking differently: Take 10 mg by mouth at bedtime.), Disp: 90 tablet, Rfl: 3   pantoprazole  (PROTONIX ) 40 MG tablet, Take 1 tablet (40 mg total) by mouth daily. (Patient taking differently: Take 40 mg by mouth 2 (two) times daily.), Disp: 90 tablet, Rfl: 3   potassium chloride  (KLOR-CON  M) 10 MEQ tablet, Take 10 mEq by mouth daily. Take an extra 20 meq (two tablets) on Fridays (this is the day you take Metolazone ), Disp: , Rfl:    sodium bicarbonate  650 MG tablet, take ONE tab twice  a DAY (Patient taking differently: Take 650 mg by mouth 2 (two) times daily.), Disp: 60 tablet, Rfl: 0   vitamin B-12 (CYANOCOBALAMIN ) 1000 MCG tablet, Take 1,000 mcg by mouth daily., Disp: , Rfl:    Vitamin D , Cholecalciferol , 25 MCG (1000 UT) TABS, Take 1,000 Units by mouth daily., Disp: , Rfl:    atropine 1 % ophthalmic solution, Place 1 drop into both eyes 2 (two) times daily as needed., Disp: , Rfl:    Azelastine -Fluticasone  137-50 MCG/ACT SUSP, PLACE ONE SPRAY IN EACH NOSTRIL TWICE DAILY (IN THE MORNING AND AT BEDTIME) (Patient taking differently: Place 1 spray into both nostrils in the morning and at bedtime.), Disp: 23 g, Rfl: 6   diclofenac  Sodium (VOLTAREN ) 1 % GEL, Apply 4 g topically 2 (two) times daily as needed (painful areas)., Disp: 50 g, Rfl: 1   oxyCODONE  (OXY IR/ROXICODONE ) 5 MG immediate release tablet, Take 1 tablet (5 mg total) by mouth every 6 (six) hours as needed for severe pain (pain score 7-10)., Disp: 20 tablet, Rfl: 0   RESTASIS 0.05 % ophthalmic emulsion, Place 1 drop into both eyes 2 (two) times daily., Disp: , Rfl:     Spacer/Aero-Holding Chambers (AEROCHAMBER PLUS) inhaler, Use as instructed (Patient taking differently: Use as instructed as needed), Disp: 1 each, Rfl: 2 Allergies  Allergen Reactions   Penicillins Nausea And Vomiting and Other (See Comments)    Has patient had a PCN reaction causing immediate rash, facial/tongue/throat swelling, SOB or lightheadedness with hypotension: Y Has patient had a PCN reaction causing severe rash involving mucus membranes or skin necrosis: Y Has patient had a PCN reaction that required hospitalization: N Has patient had a PCN reaction occurring within the last 10 years: N If all of the above answers are NO, then may proceed with Cephalosporin use.    Aspirin Other (See Comments)    Wheezing Acetaminophen  is OK       Social History   Socioeconomic History   Marital status: Single    Spouse name: Not on file   Number of children: 6   Years of education: Not on file   Highest education level: Not on file  Occupational History   Occupation: retired  Tobacco Use   Smoking status: Never   Smokeless tobacco: Never  Vaping Use   Vaping status: Never Used  Substance and Sexual Activity   Alcohol  use: No   Drug use: No   Sexual activity: Not Currently    Birth control/protection: Surgical  Other Topics Concern   Not on file  Social History Narrative   2 people living in the home.  Grand daughter.   Up and down through the night      Had 7 children  2 deceased . 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    works 3 days per week currently .   Glasses dentures  Neg tad    Social Drivers of Health   Financial Resource Strain: Low Risk  (04/14/2022)   Overall Financial Resource Strain (CARDIA)    Difficulty of Paying Living Expenses: Not very hard  Food Insecurity: No Food Insecurity (12/01/2022)   Hunger Vital Sign    Worried About Running Out of Food in the Last Year: Never  true    Ran Out of Food in the Last Year: Never true  Transportation Needs: No Transportation Needs (12/01/2022)   PRAPARE -  Administrator, Civil Service (Medical): No    Lack of Transportation (Non-Medical): No  Physical Activity: Not on file  Stress: Not on file  Social Connections: Not on file  Intimate Partner Violence: Not At Risk (10/31/2022)   Humiliation, Afraid, Rape, and Kick questionnaire    Fear of Current or Ex-Partner: No    Emotionally Abused: No    Physically Abused: No    Sexually Abused: No    Physical Exam      Future Appointments  Date Time Provider Department Center  05/22/2023  1:00 PM [facility] None  05/22/2023  1:45 PM [facility] None  05/31/2023 11:30 AM Panosh, Apolinar POUR, MD [facility]  06/19/2023  1:00 PM [facility] None  06/19/2023  1:45 PM [facility] FL[facility] None  06/28/2023  4:40 PM Luke Lacks R, DO [facility]  07/17/2023  1:00 PM [facility] None  07/17/2023  1:30 PM Hanford Powell BRAVO, NP [facility] None  07/17/2023  2:15 PM [facility] FL[facility] None       Izetta Quivers, Paramedic [phone] [facility] Paramedic  05/15/23

## 2023-05-21 ENCOUNTER — Other Ambulatory Visit: Payer: Self-pay | Admitting: Cardiovascular Disease

## 2023-05-21 MED ORDER — HYDRALAZINE HCL 100 MG PO TABS: 100.0000 mg | ORAL_TABLET | Freq: Three times a day (TID) | ORAL | 2 refills | Status: AC

## 2023-05-22 ENCOUNTER — Inpatient Hospital Stay: Payer: Medicare Other | Attending: Hematology

## 2023-05-22 ENCOUNTER — Other Ambulatory Visit (HOSPITAL_COMMUNITY): Payer: Self-pay

## 2023-05-22 ENCOUNTER — Inpatient Hospital Stay: Payer: Medicare Other

## 2023-05-22 ENCOUNTER — Encounter: Payer: Self-pay | Admitting: Internal Medicine

## 2023-05-22 VITALS — BP 175/56 | HR 52 | Temp 97.7°F | Resp 17

## 2023-05-22 DIAGNOSIS — I129 Hypertensive chronic kidney disease with stage 1 through stage 4 chronic kidney disease, or unspecified chronic kidney disease: Secondary | ICD-10-CM | POA: Diagnosis not present

## 2023-05-22 DIAGNOSIS — N184 Chronic kidney disease, stage 4 (severe): Secondary | ICD-10-CM | POA: Diagnosis not present

## 2023-05-22 DIAGNOSIS — Z79899 Other long term (current) drug therapy: Secondary | ICD-10-CM | POA: Insufficient documentation

## 2023-05-22 DIAGNOSIS — D631 Anemia in chronic kidney disease: Secondary | ICD-10-CM | POA: Diagnosis not present

## 2023-05-22 DIAGNOSIS — D638 Anemia in other chronic diseases classified elsewhere: Secondary | ICD-10-CM

## 2023-05-22 LAB — CBC WITH DIFFERENTIAL (CANCER CENTER ONLY)
Abs Immature Granulocytes: 0.02 10*3/uL (ref 0.00–0.07)
Basophils Absolute: 0 10*3/uL (ref 0.0–0.1)
Basophils Relative: 0 %
Eosinophils Absolute: 0.1 10*3/uL (ref 0.0–0.5)
Eosinophils Relative: 3 %
HCT: 30.2 % — ABNORMAL LOW (ref 36.0–46.0)
Hemoglobin: 9.6 g/dL — ABNORMAL LOW (ref 12.0–15.0)
Immature Granulocytes: 1 %
Lymphocytes Relative: 35 %
Lymphs Abs: 1.4 10*3/uL (ref 0.7–4.0)
MCH: 29.4 pg (ref 26.0–34.0)
MCHC: 31.8 g/dL (ref 30.0–36.0)
MCV: 92.4 fL (ref 80.0–100.0)
Monocytes Absolute: 0.3 10*3/uL (ref 0.1–1.0)
Monocytes Relative: 7 %
Neutro Abs: 2.2 10*3/uL (ref 1.7–7.7)
Neutrophils Relative %: 54 %
Platelet Count: 166 10*3/uL (ref 150–400)
RBC: 3.27 MIL/uL — ABNORMAL LOW (ref 3.87–5.11)
RDW: 15 % (ref 11.5–15.5)
WBC Count: 4 10*3/uL (ref 4.0–10.5)
nRBC: 0 % (ref 0.0–0.2)

## 2023-05-22 MED ORDER — DARBEPOETIN ALFA 100 MCG/0.5ML IJ SOSY
100.0000 ug | PREFILLED_SYRINGE | Freq: Once | INTRAMUSCULAR | Status: AC
  Administered 2023-05-22: 100 ug via SUBCUTANEOUS
  Filled 2023-05-22: qty 0.5

## 2023-05-22 NOTE — Progress Notes (Signed)
 Paramedicine Encounter    Patient ID: [name], female    DOB: 1933-09-10, 89 y.o.   MRN: [id number]   Complaints-none   Edema-yes   Compliance with meds-see below   Pill box filled-yes X 2 wks  If so, by whom-paramedic   Refills needed-pantoprazole , sodium bicarb, clopidogrel , ator, clonidine , citalopram  doxazosin , folic acid , furosemide    Pt reports feeling ok. Her g-daughter is there with her. She had infusion today.  I have noticed repetitive questioning and statements, I discussed with g-daughter and the family has noticed it also getting worse.  She mentioned asking her doc about a cognitive test, I advised that was a good idea. I did see that a message was sent to PCP asking about this.  G-daughter also advised that she has not been taking meds right-she was seen getting her pills and that it wasn't right- she has been picking thru random days and not even getting the BID meds in BID.  I believe this is coming from confusion she is having.during our visit this evening she asked me about 5-6 times if there are meds needing refills.  Thankfully she has family that  is going to be around more during the day to help remove some of this confusion and be sure pt takes meds correctly. Hopefully with doing this, her b/p will be better controlled.  They are also going to be monitoring her b/p at home-just a little concerned her b/p may drop very low once she takes these meds as directed so will keep close eye on it.  I filled up 2 wks of pill boxes in case of inclement weather and I cannot make it in to work so to make sure she has her meds.  I gave her g-daughter list of meds needing refilled next week.  She is also going to put up that 2nd pill box out of pts reach so she doesn't get things mixed up.  I will f/u next week.      BP (!) 170/62   Pulse (!) 54   Resp 16   Wt 146 lb (66.2 kg)   SpO2 99%   BMI 25.86 kg/m  Weight yesterday-? Last visit weight-144  Patient  Care Team: Panosh, Apolinar POUR, MD as PCP - General (Internal Medicine) Nahser, Aleene PARAS, MD as PCP - Cardiology (Cardiology) Addie, Cordella Hamilton, MD (Orthopedic Surgery) Lanny Callander, MD as Consulting Physician (Hematology) Gearline Norris, MD as Consulting Physician (Nephrology) Cleatus Collar, MD as Consulting Physician (Ophthalmology) Maree Paticia BRAVO, MD as Referring Physician (Ophthalmology) Shelah Lamar RAMAN, MD as Consulting Physician (Pulmonary Disease) Lionell Jon DEL, [facility] (Pharmacist) Claudette, Md, MD (Cardiology) Gardenia Led, DO as Consulting Physician (Cardiology)  Patient Active Problem List   Diagnosis Date Noted   CAP (community acquired pneumonia) 10/31/2022   Diastolic congestive heart failure (HCC) 10/31/2022   Hypertensive urgency 10/31/2022   Moderate persistent asthma 10/31/2022   Hypomagnesemia 10/31/2022   CKD (chronic kidney disease), stage IV (HCC) 10/31/2022   Pulmonary hypertension, unspecified (HCC) 12/06/2021   Chronic cough 10/18/2021   Chronic rhinitis 11/24/2019   Nausea 11/03/2018   Elevated troponin 11/03/2018   Hyponatremia 11/03/2018   Hypertensive heart disease with hypertensive chronic kidney disease 11/30/2017   Fasting hyperglycemia 11/30/2017   Renal cyst 03/27/2016   CKD (chronic kidney disease) stage 3, GFR 30-59 ml/min (HCC) 08/23/2015   Subjective hearing change 06/29/2014   Resistant hypertension 06/29/2014   Lumbago 06/15/2014   Pre-diabetes vs early DM  06/15/2014  Dyspnea 04/27/2014   Asthma, chronic 04/13/2014   Elevated uric acid in blood 04/13/2014   Essential hypertension 03/27/2014   History of DVT (deep vein thrombosis)    Palpitations 01/05/2014   Anemia of chronic disease Dec 21, 2013   Death of family member 08-25-13   Leg edema 06/20/2013   Bereavement due to life event 04/21/2013   Other dysphagia 02/11/2013   Colon cancer screening 02/11/2013   Anxiety state 01/20/2013   Leg cramps 01/20/2013   Back pain  12/05/2012   Family hx of colon cancer 10/21/2012   Family hx of lung cancer 10/21/2012   Family hx-breast malignancy 10/21/2012   Renal insufficiency creatinine 1.3 10/21/2012   Anemia, chronic disease 10/21/2012   GERD (gastroesophageal reflux disease) 09/08/2012   HTN (hypertension) 09/08/2012   Hyperlipidemia 09/08/2012   Varicose veins of leg with complications 12/05/2010    Current Outpatient Medications:    acetaminophen  (TYLENOL ) 500 MG tablet, Take 500 mg by mouth in the morning and at bedtime., Disp: , Rfl:    albuterol  (PROVENTIL ) (2.5 MG/3ML) 0.083% nebulizer solution, Take 3 mLs (2.5 mg total) by nebulization every 6 (six) hours as needed for shortness of breath or wheezing., Disp: 360 mL, Rfl: 6   albuterol  (VENTOLIN  HFA) 108 (90 Base) MCG/ACT inhaler, USE 2 PUFFS EVERY 6 HOURS AS NEEDED FOR WHEEZING., Disp: 8.5 g, Rfl: 3   amLODipine  (NORVASC ) 10 MG tablet, Take 1 tablet (10 mg total) by mouth daily. (Patient taking differently: Take 10 mg by mouth at bedtime.), Disp: 30 tablet, Rfl: 1   atorvastatin  (LIPITOR) 40 MG tablet, Take 1 tablet (40 mg total) by mouth daily. (Patient taking differently: Take 40 mg by mouth every evening.), Disp: 90 tablet, Rfl: 3   Azelastine -Fluticasone  137-50 MCG/ACT SUSP, PLACE ONE SPRAY IN EACH NOSTRIL TWICE DAILY (IN THE MORNING AND AT BEDTIME) (Patient taking differently: Place 1 spray into both nostrils in the morning and at bedtime.), Disp: 23 g, Rfl: 6   Budeson-Glycopyrrol-Formoterol  (BREZTRI  AEROSPHERE) 160-9-4.8 MCG/ACT AERO, INHALE 2 PUFFS INTO THE LUNGS TWICE DAILY, IN THE MORNING & AT BEDTIME., Disp: 10.7 g, Rfl: 11   carvedilol  (COREG ) 12.5 MG tablet, Take 1 tablet (12.5 mg total) by mouth 2 (two) times daily., Disp: 180 tablet, Rfl: 3   cetirizine (ZYRTEC) 10 MG tablet, Take 10 mg by mouth daily as needed for allergies or rhinitis., Disp: , Rfl:    citalopram  (CELEXA ) 20 MG tablet, Take 1 tablet (20 mg total) by mouth every evening.,  Disp: 90 tablet, Rfl: 0   cloNIDine  (CATAPRES ) 0.1 MG tablet, Take 1 tablet (0.1 mg total) by mouth 2 (two) times daily. If needed for hypertension, Disp: 60 tablet, Rfl: 0   clopidogrel  (PLAVIX ) 75 MG tablet, TAKE ONE TABLET BY MOUTH ONCE DAILY., Disp: 30 tablet, Rfl: 1   diclofenac  Sodium (VOLTAREN ) 1 % GEL, Apply 4 g topically 2 (two) times daily as needed (painful areas)., Disp: 50 g, Rfl: 1   doxazosin  (CARDURA ) 4 MG tablet, take ONE tab every 12 hours (Patient taking differently: Take 4 mg by mouth 2 (two) times daily.), Disp: 60 tablet, Rfl: 0   ferrous sulfate  325 (65 FE) MG EC tablet, Take 1 tablet (325 mg total) by mouth daily with breakfast. (Patient taking differently: Take 325 mg by mouth.), Disp: 30 tablet, Rfl: 3   furosemide  (LASIX ) 40 MG tablet, Take 1.5 tablets (60 mg total) by mouth 2 (two) times daily., Disp: 270 tablet, Rfl: 3   hydrALAZINE  (APRESOLINE ) 100 MG  tablet, Take 1 tablet (100 mg total) by mouth 3 (three) times daily. (Patient taking differently: Take 100 mg by mouth 3 (three) times daily.), Disp: 270 tablet, Rfl: 2   isosorbide  mononitrate (IMDUR ) 30 MG 24 hr tablet, Take 1 tablet (30 mg total) by mouth daily., Disp: 90 tablet, Rfl: 3   LORazepam  (ATIVAN ) 0.5 MG tablet, Take 1 tablet (0.5 mg total) by mouth 2 (two) times daily as needed. for anxiety, Disp: 30 tablet, Rfl: 0   metolazone  (ZAROXOLYN ) 2.5 MG tablet, Take 1 tablet (2.5 mg total) by mouth once a week. Take every Friday, Disp: 15 tablet, Rfl: 3   montelukast  (SINGULAIR ) 10 MG tablet, Take 1 tablet (10 mg total) by mouth daily. (Patient taking differently: Take 10 mg by mouth at bedtime.), Disp: 90 tablet, Rfl: 3   oxyCODONE  (OXY IR/ROXICODONE ) 5 MG immediate release tablet, Take 1 tablet (5 mg total) by mouth every 6 (six) hours as needed for severe pain (pain score 7-10)., Disp: 20 tablet, Rfl: 0   pantoprazole  (PROTONIX ) 40 MG tablet, Take 1 tablet (40 mg total) by mouth daily. (Patient taking differently:  Take 40 mg by mouth 2 (two) times daily.), Disp: 90 tablet, Rfl: 3   potassium chloride  (KLOR-CON  M) 10 MEQ tablet, Take 10 mEq by mouth daily. Take an extra 20 meq (two tablets) on Fridays (this is the day you take Metolazone ), Disp: , Rfl:    RESTASIS 0.05 % ophthalmic emulsion, Place 1 drop into both eyes 2 (two) times daily., Disp: , Rfl:    sodium bicarbonate  650 MG tablet, take ONE tab twice a DAY (Patient taking differently: Take 650 mg by mouth 2 (two) times daily.), Disp: 60 tablet, Rfl: 0   vitamin B-12 (CYANOCOBALAMIN ) 1000 MCG tablet, Take 1,000 mcg by mouth daily., Disp: , Rfl:    Vitamin D , Cholecalciferol , 25 MCG (1000 UT) TABS, Take 1,000 Units by mouth daily., Disp: , Rfl:    atropine 1 % ophthalmic solution, Place 1 drop into both eyes 2 (two) times daily as needed., Disp: , Rfl:    fluticasone  (FLONASE ) 50 MCG/ACT nasal spray, Place 2 sprays into both nostrils as needed for allergies or rhinitis., Disp: , Rfl:    folic acid  (FOLVITE ) 1 MG tablet, take ONE tab daily (Patient taking differently: Take 1 mg by mouth daily in the afternoon.), Disp: 30 tablet, Rfl: 0   Spacer/Aero-Holding Chambers (AEROCHAMBER PLUS) inhaler, Use as instructed (Patient taking differently: Use as instructed as needed), Disp: 1 each, Rfl: 2 Allergies  Allergen Reactions   Penicillins Nausea And Vomiting and Other (See Comments)    Has patient had a PCN reaction causing immediate rash, facial/tongue/throat swelling, SOB or lightheadedness with hypotension: Y Has patient had a PCN reaction causing severe rash involving mucus membranes or skin necrosis: Y Has patient had a PCN reaction that required hospitalization: N Has patient had a PCN reaction occurring within the last 10 years: N If all of the above answers are NO, then may proceed with Cephalosporin use.    Aspirin Other (See Comments)    Wheezing Acetaminophen  is OK       Social History   Socioeconomic History   Marital status: Single     Spouse name: Not on file   Number of children: 6   Years of education: Not on file   Highest education level: Not on file  Occupational History   Occupation: retired  Tobacco Use   Smoking status: Never   Smokeless tobacco: Never  Vaping Use   Vaping status: Never Used  Substance and Sexual Activity   Alcohol  use: No   Drug use: No   Sexual activity: Not Currently    Birth control/protection: Surgical  Other Topics Concern   Not on file  Social History Narrative   2 people living in the home.  Grand daughter.   Up and down through the night      Had 7 children  2 deceased . 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    works 3 days per week currently .   Glasses dentures  Neg tad    Social Drivers of Health   Financial Resource Strain: Low Risk  (04/14/2022)   Overall Financial Resource Strain (CARDIA)    Difficulty of Paying Living Expenses: Not very hard  Food Insecurity: No Food Insecurity (12/01/2022)   Hunger Vital Sign    Worried About Running Out of Food in the Last Year: Never true    Ran Out of Food in the Last Year: Never true  Transportation Needs: No Transportation Needs (12/01/2022)   PRAPARE - Administrator, Civil Service (Medical): No    Lack of Transportation (Non-Medical): No  Physical Activity: Not on file  Stress: Not on file  Social Connections: Not on file  Intimate Partner Violence: Not At Risk (10/31/2022)   Humiliation, Afraid, Rape, and Kick questionnaire    Fear of Current or Ex-Partner: No    Emotionally Abused: No    Physically Abused: No    Sexually Abused: No    Physical Exam      Future Appointments  Date Time Provider Department Center  05/31/2023 11:30 AM Charlett Apolinar POUR, MD [facility]  06/07/2023 12:30 PM [id number]  06/19/2023  1:00 PM [facility] None  06/19/2023  1:45 PM [facility]  [facility] None  06/28/2023  4:40 PM Luke Chiquita SAUNDERS, DO [facility]  07/17/2023  1:00 PM [facility] None  07/17/2023  1:30 PM Hanford Powell BRAVO, NP [facility] None  07/17/2023  2:15 PM [facility] FL[facility] None       Izetta Quivers, Paramedic [phone] [facility] Paramedic  05/22/23

## 2023-05-23 NOTE — Telephone Encounter (Signed)
 please keep appt for next week and bring  her bottles   in also so we can review .Marland KitchenOften  medication can cause  memory problems   Monitor how often taking  pain med( oxycodone)  We can do the screening tests but may need more help with neuro .

## 2023-05-25 ENCOUNTER — Other Ambulatory Visit: Payer: Self-pay | Admitting: Internal Medicine

## 2023-05-28 ENCOUNTER — Other Ambulatory Visit (HOSPITAL_COMMUNITY): Payer: Self-pay | Admitting: *Deleted

## 2023-05-28 MED ORDER — FOLIC ACID 1 MG PO TABS: 1.0000 mg | ORAL_TABLET | Freq: Every day | ORAL | 0 refills | Status: DC

## 2023-05-28 MED ORDER — LORAZEPAM 0.5 MG PO TABS: 0.5000 mg | ORAL_TABLET | Freq: Two times a day (BID) | ORAL | 0 refills | Status: DC | PRN

## 2023-05-28 MED ORDER — DOXAZOSIN MESYLATE 4 MG PO TABS: 4.0000 mg | ORAL_TABLET | Freq: Two times a day (BID) | ORAL | 6 refills | Status: DC

## 2023-05-28 MED ORDER — CLONIDINE HCL 0.1 MG PO TABS: 0.1000 mg | ORAL_TABLET | Freq: Two times a day (BID) | ORAL | 0 refills | Status: DC

## 2023-05-28 MED ORDER — CITALOPRAM HYDROBROMIDE 20 MG PO TABS: 20.0000 mg | ORAL_TABLET | Freq: Every evening | ORAL | 0 refills | Status: DC

## 2023-05-29 ENCOUNTER — Other Ambulatory Visit: Payer: Self-pay | Admitting: Internal Medicine

## 2023-05-29 ENCOUNTER — Other Ambulatory Visit: Payer: Self-pay | Admitting: Family

## 2023-05-30 ENCOUNTER — Other Ambulatory Visit (HOSPITAL_COMMUNITY): Payer: Self-pay

## 2023-05-30 MED ORDER — VITAMIN D (CHOLECALCIFEROL) 25 MCG (1000 UT) PO TABS: 1000.0000 [IU] | ORAL_TABLET | Freq: Every day | ORAL | 2 refills | Status: AC

## 2023-05-30 NOTE — Progress Notes (Signed)
 Paramedicine Encounter    Patient ID: [name], female    DOB: 12/04/1933, 89 y.o.   MRN: [id number]  Complaints-none   Edema-yes   Compliance with meds-yes   Pill box filled-yes  If so, by whom-grand daughter filled   Refills needed-grand daughter handled   No meds today. Not really much of an appetite recently so she doesn't take her meds at that time. She did take them yesterday, none today yet.  Pt reports h/a today. B/p elevated.  Sometimes she has dizziness. Has a lot of edema to her legs, not wearing compression stockings. Advised her to elevated feet tonight and hopefully be able to put on stockings in morning. Too swollen to try now.  She reports her breathing is doing ok.   Home b/p's-  170 155--130s She goes to PCP tomor and they are going to talk about all the multiple meds she takes for her b/p and are going to take the b/p log as well.    BP (!) 182/62   Pulse 60   Resp 16   Wt 152 lb (68.9 kg)   BMI 26.93 kg/m  Weight yesterday-151 lb Last visit weight-146  Patient Care Team: Reginal Capra, MD as PCP - General (Internal Medicine) Nahser, Lela Purple, MD as PCP - Cardiology (Cardiology) Rozelle Corning, Maricela Shoe, MD (Orthopedic Surgery) Sonja [place], MD as Consulting Physician (Hematology) Leandra Pro, MD as Consulting Physician (Nephrology) Amedeo Jupiter, MD as Consulting Physician (Ophthalmology) Edna Gouty, MD as Referring Physician (Ophthalmology) Baldwin Levee [place]ora [place], MD as Consulting Physician (Pulmonary Disease) Zilphia Hilt Daria Eddy, RPH (Pharmacist) Eloy Half, Md, MD (Cardiology) Alwin Baars, DO as Consulting Physician (Cardiology)  Patient Active Problem List   Diagnosis Date Noted   CAP (community acquired pneumonia) 10/31/2022   Diastolic congestive heart failure (HCC) 10/31/2022   Hypertensive urgency 10/31/2022   Moderate persistent asthma 10/31/2022   Hypomagnesemia 10/31/2022   CKD (chronic kidney disease), stage IV (HCC) 10/31/2022    Pulmonary hypertension, unspecified (HCC) 12/06/2021   Chronic cough 10/18/2021   Chronic rhinitis 11/24/2019   Nausea 11/03/2018   Elevated troponin 11/03/2018   Hyponatremia 11/03/2018   Hypertensive heart disease with hypertensive chronic kidney disease 11/30/2017   Fasting hyperglycemia 11/30/2017   Renal cyst 03/27/2016   CKD (chronic kidney disease) stage 3, GFR 30-59 ml/min (HCC) 08/23/2015   Subjective hearing change 06/29/2014   Resistant hypertension 06/29/2014   Lumbago 06/15/2014   Pre-diabetes vs early DM  06/15/2014   Dyspnea 04/27/2014   Asthma, chronic 04/13/2014   Elevated uric acid in blood 04/13/2014   Essential hypertension 03/27/2014   History of DVT (deep vein thrombosis)    Palpitations 01/05/2014   Anemia of chronic disease 12/23/13   Death of family member 08/27/2013   Leg edema 06/20/2013   Bereavement due to life event 04/21/2013   Other dysphagia 02/11/2013   Colon cancer screening 02/11/2013   Anxiety state 01/20/2013   Leg cramps 01/20/2013   Back pain 12/05/2012   Family hx of colon cancer 10/21/2012   Family hx of lung cancer 10/21/2012   Family hx-breast malignancy 10/21/2012   Renal insufficiency creatinine 1.3 10/21/2012   Anemia, chronic disease 10/21/2012   GERD (gastroesophageal reflux disease) 09/08/2012   HTN (hypertension) 09/08/2012   Hyperlipidemia 09/08/2012   Varicose veins of leg with complications 12/05/2010    Current Outpatient Medications:    acetaminophen  (TYLENOL ) 500 MG tablet, Take 500 mg by mouth in the morning and at bedtime., Disp: , Rfl:  albuterol  (PROVENTIL ) (2.5 MG/3ML) 0.083% nebulizer solution, Take 3 mLs (2.5 mg total) by nebulization every 6 (six) hours as needed for shortness of breath or wheezing., Disp: 360 mL, Rfl: 6   albuterol  (VENTOLIN  HFA) 108 (90 Base) MCG/ACT inhaler, USE 2 PUFFS EVERY 6 HOURS AS NEEDED FOR WHEEZING., Disp: 8.5 g, Rfl: 3   amLODipine  (NORVASC ) 10 MG tablet, Take 1 tablet (10 mg  total) by mouth daily. (Patient taking differently: Take 10 mg by mouth at bedtime.), Disp: 30 tablet, Rfl: 1   atorvastatin  (LIPITOR) 40 MG tablet, Take 1 tablet (40 mg total) by mouth daily. (Patient taking differently: Take 40 mg by mouth every evening.), Disp: 90 tablet, Rfl: 3   atropine 1 % ophthalmic solution, Place 1 drop into both eyes 2 (two) times daily as needed., Disp: , Rfl:    Azelastine -Fluticasone  137-50 MCG/ACT SUSP, PLACE ONE SPRAY IN EACH NOSTRIL TWICE DAILY (IN THE MORNING AND AT BEDTIME) (Patient taking differently: Place 1 spray into both nostrils in the morning and at bedtime.), Disp: 23 g, Rfl: 6   Budeson-Glycopyrrol-Formoterol  (BREZTRI  AEROSPHERE) 160-9-4.8 MCG/ACT AERO, INHALE 2 PUFFS INTO THE LUNGS TWICE DAILY, IN THE MORNING & AT BEDTIME., Disp: 10.7 g, Rfl: 11   carvedilol  (COREG ) 12.5 MG tablet, Take 1 tablet (12.5 mg total) by mouth 2 (two) times daily., Disp: 180 tablet, Rfl: 3   cetirizine (ZYRTEC) 10 MG tablet, Take 10 mg by mouth daily as needed for allergies or rhinitis., Disp: , Rfl:    citalopram  (CELEXA ) 20 MG tablet, Take 1 tablet (20 mg total) by mouth every evening., Disp: 90 tablet, Rfl: 0   cloNIDine  (CATAPRES ) 0.1 MG tablet, Take 1 tablet (0.1 mg total) by mouth 2 (two) times daily. If needed for hypertension, Disp: 60 tablet, Rfl: 0   clopidogrel  (PLAVIX ) 75 MG tablet, TAKE ONE TABLET BY MOUTH ONCE DAILY., Disp: 30 tablet, Rfl: 1   diclofenac  Sodium (VOLTAREN ) 1 % GEL, Apply 4 g topically 2 (two) times daily as needed (painful areas)., Disp: 50 g, Rfl: 1   doxazosin  (CARDURA ) 4 MG tablet, Take 1 tablet (4 mg total) by mouth 2 (two) times daily., Disp: 60 tablet, Rfl: 6   ferrous sulfate  325 (65 FE) MG EC tablet, Take 1 tablet (325 mg total) by mouth daily with breakfast. (Patient taking differently: Take 325 mg by mouth.), Disp: 30 tablet, Rfl: 3   fluticasone  (FLONASE ) 50 MCG/ACT nasal spray, Place 2 sprays into both nostrils as needed for allergies or  rhinitis., Disp: , Rfl:    folic acid  (FOLVITE ) 1 MG tablet, Take 1 tablet (1 mg total) by mouth daily., Disp: 30 tablet, Rfl: 0   hydrALAZINE  (APRESOLINE ) 100 MG tablet, Take 1 tablet (100 mg total) by mouth 3 (three) times daily. (Patient taking differently: Take 100 mg by mouth 3 (three) times daily.), Disp: 270 tablet, Rfl: 2   isosorbide  mononitrate (IMDUR ) 30 MG 24 hr tablet, Take 1 tablet (30 mg total) by mouth daily., Disp: 90 tablet, Rfl: 3   LORazepam  (ATIVAN ) 0.5 MG tablet, Take 1 tablet (0.5 mg total) by mouth 2 (two) times daily as needed. for anxiety, Disp: 30 tablet, Rfl: 0   metolazone  (ZAROXOLYN ) 2.5 MG tablet, Take 1 tablet (2.5 mg total) by mouth once a week. Take every Friday, Disp: 15 tablet, Rfl: 3   montelukast  (SINGULAIR ) 10 MG tablet, Take 1 tablet (10 mg total) by mouth daily. (Patient taking differently: Take 10 mg by mouth at bedtime.), Disp: 90 tablet, Rfl:  3   oxyCODONE  (OXY IR/ROXICODONE ) 5 MG immediate release tablet, Take 1 tablet (5 mg total) by mouth every 6 (six) hours as needed for severe pain (pain score 7-10)., Disp: 20 tablet, Rfl: 0   pantoprazole  (PROTONIX ) 40 MG tablet, Take 1 tablet (40 mg total) by mouth daily. (Patient taking differently: Take 40 mg by mouth 2 (two) times daily.), Disp: 90 tablet, Rfl: 3   potassium chloride  (KLOR-CON  M) 10 MEQ tablet, Take 10 mEq by mouth daily. Take an extra 20 meq (two tablets) on Fridays (this is the day you take Metolazone ), Disp: , Rfl:    RESTASIS 0.05 % ophthalmic emulsion, Place 1 drop into both eyes 2 (two) times daily., Disp: , Rfl:    sodium bicarbonate  650 MG tablet, take ONE tab twice a DAY (Patient taking differently: Take 650 mg by mouth 2 (two) times daily.), Disp: 60 tablet, Rfl: 0   Spacer/Aero-Holding Chambers (AEROCHAMBER PLUS) inhaler, Use as instructed (Patient taking differently: Use as instructed as needed), Disp: 1 each, Rfl: 2   vitamin B-12 (CYANOCOBALAMIN ) 1000 MCG tablet, Take 1,000 mcg by mouth  daily., Disp: , Rfl:    furosemide  (LASIX ) 40 MG tablet, Take 1.5 tablets (60 mg total) by mouth 2 (two) times daily., Disp: 270 tablet, Rfl: 3   Vitamin D , Cholecalciferol , 25 MCG (1000 UT) TABS, Take 1,000 Units by mouth daily., Disp: 60 tablet, Rfl: 2 Allergies  Allergen Reactions   Penicillins Nausea And Vomiting and Other (See Comments)    Has patient had a PCN reaction causing immediate rash, facial/tongue/throat swelling, SOB or lightheadedness with hypotension: Y Has patient had a PCN reaction causing severe rash involving mucus membranes or skin necrosis: Y Has patient had a PCN reaction that required hospitalization: N Has patient had a PCN reaction occurring within the last 10 years: N If all of the above answers are "NO", then may proceed with Cephalosporin use.    Aspirin Other (See Comments)    Wheezing Acetaminophen  is OK       Social History   Socioeconomic History   Marital status: Single    Spouse name: Not on file   Number of children: 6   Years of education: Not on file   Highest education level: Not on file  Occupational History   Occupation: retired  Tobacco Use   Smoking status: Never   Smokeless tobacco: Never  Vaping Use   Vaping status: Never Used  Substance and Sexual Activity   Alcohol use: No   Drug use: No   Sexual activity: Not Currently    Birth control/protection: Surgical  Other Topics Concern   Not on file  Social History Narrative   2 people living in the home.  Grand daughter.   Up and down through the night      Had 7 children  2 deceased . 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    works 3 days per week currently .   Glasses dentures  Neg tad    Social Drivers of Health   Financial Resource Strain: Low Risk  (04/14/2022)   Overall Financial Resource Strain (CARDIA)    Difficulty of Paying Living Expenses: Not very hard  Food Insecurity: No Food  Insecurity (12/01/2022)   Hunger Vital Sign    Worried About Running Out of Food in the Last Year: Never true    Ran Out of Food  in the Last Year: Never true  Transportation Needs: No Transportation Needs (12/01/2022)   PRAPARE - Administrator, Civil Service (Medical): No    Lack of Transportation (Non-Medical): No  Physical Activity: Not on file  Stress: Not on file  Social Connections: Not on file  Intimate Partner Violence: Not At Risk (10/31/2022)   Humiliation, Afraid, Rape, and Kick questionnaire    Fear of Current or Ex-Partner: No    Emotionally Abused: No    Physically Abused: No    Sexually Abused: No    Physical Exam      Future Appointments  Date Time Provider Department Center  05/31/2023 11:30 AM Reginal Capra, MD [facility]  06/07/2023 12:30 PM [facility]2/TREAD [id number]  06/19/2023  1:00 PM [facility] None  06/19/2023  1:45 PM [facility] FLUSH [facility] None  06/28/2023  4:40 PM Maurie Southern, DO [facility]  07/17/2023  1:00 PM [facility] None  07/17/2023  1:30 PM Sharyon Deis, NP [facility] None  07/17/2023  2:15 PM [facility] FLUSH [facility] None       Alfonza Angry, Paramedic [phone] [facility] Paramedic  05/30/23

## 2023-05-30 NOTE — Progress Notes (Signed)
Chief Complaint  Patient presents with   Medical Management of Chronic Issues    HPI:  Comes in  for  Chronic disease management and concern from family about memory and decline since last hodp contact  . Here with GRDs live with her and help care .  Bp cv meds per cards and specialty care   Bp is up and down  a t one  point 130 140 range  seemed to be better  when daughter was home for weather . She calls to remind but trying to take with food and n often not hungry HAs since fall  only take tylenol  not sure what time of day or triggers  ? throbbing? Uncertain timing to meds or missed meds     Mri brain  12 24 no acute findings  when went to ed for dizziness and  HA.   Only ocass use of ativan  when asks  family in control of these medication  and not taking narcotic ( only took one pill)  No future cards visit in system    Getting iron infusion for  anemia no bleeding  Concern about st memory  hard to remember repeating ?s of the day more orten  remote memory very good .  This is since ? Near the onset of headaches   Past Medical History:  Diagnosis Date   Acute superficial venous thrombosis of left lower extremity 03/27/2014   concern about hx and potential extensive on exam tenderness calf and low dose lovenox 40 qd 2-4 weekselevetion and close  fu advised .    Allergy    Anemia    Anxiety    Arthritis    "shoulders" (09/09/2012)   Asthma 09/08/2012   Carotid bruit    [place] 2018  low risk 1 - 39%   Cataract    bil cateracts removed   Chronic kidney disease    Clotting disorder (HCC)    blood clots in legs   Diabetes mellitus without complication (HCC)    "Borderline" per pt; being monitored.  no meds per pt   Diverticulosis    Dyspnea 04/27/2014   GERD (gastroesophageal reflux disease)    Headache(784.0)    "related to my high blood pressure" (09/09/2012)   Hiatal hernia    History of DVT (deep vein thrombosis)    "RLE" (09/09/2012) coumadine cant take asa so on plavix     HTN (hypertension) 09/08/2012   Hyperlipidemia    Lupus    "cured years ago" (09/09/2012)   Other dysphagia 02/11/2013   Syncope and collapse 01/21/2018   Varicose veins    Varicose veins of leg with complications 12/05/2010    Family History  Problem Relation Age of Onset   Hypertension Mother    Lung cancer Mother    Hypertension Father    Lung cancer Father    Breast cancer Sister    Colon cancer Sister        ? 52' s dx - died in 84's   Breast cancer Sister    Hypertension Brother    Rectal cancer Brother    Stomach cancer Brother    Breast cancer Brother    Heart attack Daughter    Esophageal cancer Daughter    Hypertension Daughter    Diabetes Daughter    Breast cancer Paternal Aunt    Lung cancer Other        both parents    Pancreatic cancer Neg Hx  Social History   Socioeconomic History   Marital status: Single    Spouse name: Not on file   Number of children: 6   Years of education: Not on file   Highest education level: Not on file  Occupational History   Occupation: retired  Tobacco Use   Smoking status: Never   Smokeless tobacco: Never  Vaping Use   Vaping status: Never Used  Substance and Sexual Activity   Alcohol use: No   Drug use: No   Sexual activity: Not Currently    Birth control/protection: Surgical  Other Topics Concern   Not on file  Social History Narrative   2 people living in the home.  Grand daughter.   Up and down through the night      Had 7 children  2 deceased . 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    works 3 days per week currently .   Glasses dentures  Neg tad    Social Drivers of Health   Financial Resource Strain: Low Risk  (04/14/2022)   Overall Financial Resource Strain (CARDIA)    Difficulty of Paying Living Expenses: Not very hard  Food Insecurity: No Food Insecurity (12/01/2022)   Hunger Vital Sign    Worried About Running  Out of Food in the Last Year: Never true    Ran Out of Food in the Last Year: Never true  Transportation Needs: No Transportation Needs (12/01/2022)   PRAPARE - Administrator, Civil Service (Medical): No    Lack of Transportation (Non-Medical): No  Physical Activity: Not on file  Stress: Not on file  Social Connections: Not on file    Outpatient Medications Prior to Visit  Medication Sig Dispense Refill   acetaminophen (TYLENOL) 500 MG tablet Take 500 mg by mouth in the morning and at bedtime.     albuterol (PROVENTIL) (2.5 MG/3ML) 0.083% nebulizer solution Take 3 mLs (2.5 mg total) by nebulization every 6 (six) hours as needed for shortness of breath or wheezing. 360 mL 6   albuterol (VENTOLIN HFA) 108 (90 Base) MCG/ACT inhaler USE 2 PUFFS EVERY 6 HOURS AS NEEDED FOR WHEEZING. 8.5 g 3   amLODipine (NORVASC) 10 MG tablet Take 1 tablet (10 mg total) by mouth daily. (Patient taking differently: Take 10 mg by mouth at bedtime.) 30 tablet 1   atorvastatin (LIPITOR) 40 MG tablet Take 1 tablet (40 mg total) by mouth daily. (Patient taking differently: Take 40 mg by mouth every evening.) 90 tablet 3   atropine 1 % ophthalmic solution Place 1 drop into both eyes 2 (two) times daily as needed.     Azelastine-Fluticasone 137-50 MCG/ACT SUSP PLACE ONE SPRAY IN EACH NOSTRIL TWICE DAILY (IN THE MORNING AND AT BEDTIME) (Patient taking differently: Place 1 spray into both nostrils in the morning and at bedtime.) 23 g 6   Budeson-Glycopyrrol-Formoterol (BREZTRI AEROSPHERE) 160-9-4.8 MCG/ACT AERO INHALE 2 PUFFS INTO THE LUNGS TWICE DAILY, IN THE MORNING & AT BEDTIME. 10.7 g 11   carvedilol (COREG) 12.5 MG tablet Take 1 tablet (12.5 mg total) by mouth 2 (two) times daily. 180 tablet 3   cetirizine (ZYRTEC) 10 MG tablet Take 10 mg by mouth daily as needed for allergies or rhinitis.     cloNIDine (CATAPRES) 0.1 MG tablet Take 1 tablet (0.1 mg total) by mouth 2 (two) times daily. If needed for  hypertension 60  tablet 0   clopidogrel (PLAVIX) 75 MG tablet TAKE ONE TABLET BY MOUTH ONCE DAILY. 30 tablet 1   diclofenac Sodium (VOLTAREN) 1 % GEL Apply 4 g topically 2 (two) times daily as needed (painful areas). 50 g 1   doxazosin (CARDURA) 4 MG tablet Take 1 tablet (4 mg total) by mouth 2 (two) times daily. 60 tablet 6   ferrous sulfate 325 (65 FE) MG EC tablet Take 1 tablet (325 mg total) by mouth daily with breakfast. (Patient taking differently: Take 325 mg by mouth.) 30 tablet 3   fluticasone (FLONASE) 50 MCG/ACT nasal spray Place 2 sprays into both nostrils as needed for allergies or rhinitis.     folic acid (FOLVITE) 1 MG tablet Take 1 tablet (1 mg total) by mouth daily. 30 tablet 0   hydrALAZINE (APRESOLINE) 100 MG tablet Take 1 tablet (100 mg total) by mouth 3 (three) times daily. (Patient taking differently: Take 100 mg by mouth 3 (three) times daily.) 270 tablet 2   isosorbide mononitrate (IMDUR) 30 MG 24 hr tablet Take 1 tablet (30 mg total) by mouth daily. 90 tablet 3   LORazepam (ATIVAN) 0.5 MG tablet Take 1 tablet (0.5 mg total) by mouth 2 (two) times daily as needed. for anxiety 30 tablet 0   metolazone (ZAROXOLYN) 2.5 MG tablet Take 1 tablet (2.5 mg total) by mouth once a week. Take every Friday 15 tablet 3   montelukast (SINGULAIR) 10 MG tablet Take 1 tablet (10 mg total) by mouth daily. (Patient taking differently: Take 10 mg by mouth at bedtime.) 90 tablet 3   oxyCODONE (OXY IR/ROXICODONE) 5 MG immediate release tablet Take 1 tablet (5 mg total) by mouth every 6 (six) hours as needed for severe pain (pain score 7-10). 20 tablet 0   pantoprazole (PROTONIX) 40 MG tablet Take 1 tablet (40 mg total) by mouth daily. (Patient taking differently: Take 40 mg by mouth 2 (two) times daily.) 90 tablet 3   potassium chloride (KLOR-CON M) 10 MEQ tablet Take 10 mEq by mouth daily. Take an extra 20 meq (two tablets) on Fridays (this is the day you take Metolazone)     RESTASIS 0.05 %  ophthalmic emulsion Place 1 drop into both eyes 2 (two) times daily.     sodium bicarbonate 650 MG tablet take ONE tab twice a DAY (Patient taking differently: Take 650 mg by mouth 2 (two) times daily.) 60 tablet 0   Spacer/Aero-Holding Chambers (AEROCHAMBER PLUS) inhaler Use as instructed (Patient taking differently: Use as instructed as needed) 1 each 2   vitamin B-12 (CYANOCOBALAMIN) 1000 MCG tablet Take 1,000 mcg by mouth daily.     Vitamin D, Cholecalciferol, 25 MCG (1000 UT) TABS Take 1,000 Units by mouth daily. 60 tablet 2   citalopram (CELEXA) 20 MG tablet Take 1 tablet (20 mg total) by mouth every evening. 90 tablet 0   furosemide (LASIX) 40 MG tablet Take 1.5 tablets (60 mg total) by mouth 2 (two) times daily. 270 tablet 3   No facility-administered medications prior to visit.     EXAM:  BP (!) 170/70 (BP Location: Right Arm, Patient Position: Sitting, Cuff Size: Normal)   Pulse 77   Temp 98.4 F (36.9 C) (Oral)   Ht 5\' 3"  (1.6 m)   Wt 150 lb 3.2 oz (68.1 kg)   SpO2 95%   BMI 26.61 kg/m   Body mass index is 26.61 kg/m.  GENERAL: vitals reviewed and listed above, alert, oriented, appears well hydrated  and in no acute distress HEENT: atraumatic, conjunctiva  clear, no obvious abnormalities on inspection of external nose and ears  no temporal atenderness  NECK: no obvious masses on inspection palpation  LUNGS: clear to auscultation bilaterally, no wheezes, rales or rhonchi, good air movement CV: HRRR, no clubbing cyanosis 2+ w edema with compression stocking MS: moves all extremities without noticeable focal  abnormality cane walking  PSYCH: pleasant and cooperative,  nl speech did not do formal cognitive testing  Lab Results  Component Value Date   WBC 4.0 05/22/2023   HGB 9.6 (L) 05/22/2023   HCT 30.2 (L) 05/22/2023   PLT 166 05/22/2023   GLUCOSE 108 (H) 05/03/2023   CHOL 190 07/21/2022   TRIG 97 07/21/2022   HDL 92 07/21/2022   LDLCALC 81 07/21/2022   ALT 12  03/13/2023   AST 22 03/13/2023   NA 143 05/03/2023   K 4.5 05/03/2023   CL 109 05/03/2023   CREATININE 3.89 (H) 05/03/2023   BUN 38 (H) 05/03/2023   CO2 22 05/03/2023   TSH 2.85 06/29/2022   INR 1.0 10/31/2022   HGBA1C 5.3 06/29/2022   MICROALBUR 119 03/02/2021   BP Readings from Last 3 Encounters:  05/31/23 (!) 170/70  05/30/23 (!) 182/62  05/22/23 (!) 170/62   MRI brain 12 24 no acute findings stroke  etc  ASSESSMENT AND PLAN:  Discussed the following assessment and plan:  Headache, unspecified headache type - onset since novemeber  23 - Plan: Ambulatory referral to Neurology  Complaints of memory disturbance - from family mostly  recnet onset  ? predated the headaches?  optimize med s number and adherance . neuro consult - Plan: Ambulatory referral to Neurology  Hypertensive heart disease with congestive heart failure and chronic kidney disease, unspecified CKD stage, unspecified heart failure type (HCC)  Medication management  Chronic kidney disease (CKD), stage IV (severe) (HCC)  Resistant hypertension Ckd 4 Bp not controlled  but  has been lower   uncertain about what entity is managing   last visit not taking prn clonidine  suspect timing of med can be optimized and make less burdensome to help adherence   Memory ?  More recent  problem  woul dconsider decreasing meds   so dec citalopram to 10 mg per day  Neuro consult about recent decline in st memory and headaches  Calendar   ? If ha related to imdur or missing hydralazine ?  Involve pharmacy team   Do not necessarily need to eat with a given med .    Time review visit and disc plan with pat and granddaughters  -Patient advised to return or notify health care team  if  new concerns arise.  Patient Instructions  Decrease the citalopram to 10 mg per day .   Please check timing of meds and  and headache calendars   and effect on BP . Since she has had some better readings.  ? If getting rebound  if missing dose  and timing   I am going to have angela  reach out to . I can do a neuro referral about headache and memory change. Recently.   Neta Mends. Liara Holm M.D.

## 2023-05-31 ENCOUNTER — Ambulatory Visit: Payer: Medicare Other | Admitting: Internal Medicine

## 2023-05-31 ENCOUNTER — Telehealth: Payer: Self-pay

## 2023-05-31 ENCOUNTER — Encounter: Payer: Self-pay | Admitting: Internal Medicine

## 2023-05-31 VITALS — BP 170/70 | HR 77 | Temp 98.4°F | Ht 63.0 in | Wt 150.2 lb

## 2023-05-31 DIAGNOSIS — R519 Headache, unspecified: Secondary | ICD-10-CM

## 2023-05-31 DIAGNOSIS — Z79899 Other long term (current) drug therapy: Secondary | ICD-10-CM | POA: Diagnosis not present

## 2023-05-31 DIAGNOSIS — I1A0 Resistant hypertension: Secondary | ICD-10-CM | POA: Diagnosis not present

## 2023-05-31 DIAGNOSIS — R413 Other amnesia: Secondary | ICD-10-CM | POA: Diagnosis not present

## 2023-05-31 DIAGNOSIS — N184 Chronic kidney disease, stage 4 (severe): Secondary | ICD-10-CM | POA: Diagnosis not present

## 2023-05-31 DIAGNOSIS — I130 Hypertensive heart and chronic kidney disease with heart failure and stage 1 through stage 4 chronic kidney disease, or unspecified chronic kidney disease: Secondary | ICD-10-CM | POA: Diagnosis not present

## 2023-05-31 MED ORDER — CITALOPRAM HYDROBROMIDE 20 MG PO TABS: 10.0000 mg | ORAL_TABLET | Freq: Every day | ORAL | 1 refills | Status: DC

## 2023-05-31 NOTE — Patient Instructions (Addendum)
Decrease the citalopram to 10 mg per day .   Please check timing of meds and  and headache calendars   and effect on BP . Since she has had some better readings.  ? If getting rebound  if missing dose and timing   I am going to have angela  reach out to . I can do a neuro referral about headache and memory change. Recently.

## 2023-05-31 NOTE — Progress Notes (Signed)
   05/31/2023  Patient ID: [name], female   DOB: July 05, 1933, 89 y.o.   MRN: [id number]  Received message from PCP asking to outreach patient to aid with medication management. Referral order placed. Scheduling team will reach out to setup appt.  Sherrill Raring, PharmD Clinical Pharmacist [phone]

## 2023-06-01 ENCOUNTER — Other Ambulatory Visit: Payer: Self-pay | Admitting: Internal Medicine

## 2023-06-01 ENCOUNTER — Telehealth (HOSPITAL_COMMUNITY): Payer: Self-pay | Admitting: *Deleted

## 2023-06-01 NOTE — Telephone Encounter (Signed)
Reached granddaughter [name] who requested that I call back and leave amyloid instructions on her vm. Message left.

## 2023-06-02 NOTE — Telephone Encounter (Signed)
Please clarify decline at this time until worked out  I have not prescribed  nor managed  this medication  ( although Worthy Rancher covering  provider appeared to  refill a request in December?)  If appears it was a hospital ordered med  in past but not sure iT  was a continuing med  .  \\Contact  pharmacy to get  a hx  of orders and prescribers  .  ALso I asked Marylene Land to help go over her meds etc and maybe she can help out.

## 2023-06-04 ENCOUNTER — Other Ambulatory Visit: Payer: Self-pay

## 2023-06-04 ENCOUNTER — Encounter: Payer: Self-pay | Admitting: Physician Assistant

## 2023-06-04 ENCOUNTER — Telehealth: Payer: Self-pay

## 2023-06-04 MED ORDER — CLOPIDOGREL BISULFATE 75 MG PO TABS: 75.0000 mg | ORAL_TABLET | Freq: Every day | ORAL | 1 refills | Status: DC

## 2023-06-04 NOTE — Progress Notes (Signed)
Care Guide Pharmacy Note  06/04/2023 Name: [name] MRN: [id number] DOB: 02-16-1934  Referred By: [name], MD Reason for referral: Care Coordination (Outreach to schedule with pharm d )   [name] is a 89 y.o. year old female who is a primary care patient of Panosh, Neta Mends, MD.  [name] was referred to the pharmacist for assistance related to: HTN and HLD  Successful contact was made with the patient to discuss pharmacy services including being ready for the pharmacist to call at least 5 minutes before the scheduled appointment time and to have medication bottles and any blood pressure readings ready for review. The patient agreed to meet with the pharmacist via telephone visit on (date/time).06/06/2023  Penne Lash , RMA     [place]  [facility], [facility] Guide  Direct Dial: [phone]  Website: [email or web address]

## 2023-06-04 NOTE — Telephone Encounter (Signed)
Contacted pharmacy and spoke to [place], Teacher, early years/pre.   He states the Rx was originally prescribed by Dr. Osvaldo Shipper  on 11/06/2022 from [facility] on [address] Contact number [phone].   Rx was refilled in December by Worthy Rancher, NP and pt had pick it up on 12/13.   Review pt chart, pt was admitted to hospital on 10/30/2022 and was in care by Dr. Osvaldo Shipper.  Please advise.

## 2023-06-05 ENCOUNTER — Encounter (HOSPITAL_COMMUNITY): Payer: Self-pay

## 2023-06-05 ENCOUNTER — Other Ambulatory Visit: Payer: Medicare Other

## 2023-06-05 ENCOUNTER — Encounter (HOSPITAL_COMMUNITY): Payer: Medicare Other | Admitting: Cardiology

## 2023-06-06 ENCOUNTER — Other Ambulatory Visit (HOSPITAL_COMMUNITY): Payer: Self-pay

## 2023-06-06 ENCOUNTER — Other Ambulatory Visit: Payer: Medicare Other

## 2023-06-06 DIAGNOSIS — I10 Essential (primary) hypertension: Secondary | ICD-10-CM

## 2023-06-06 NOTE — Progress Notes (Signed)
06/06/2023 Name: [name] MRN: [id number] DOB: 1934-04-07  Chief Complaint  Patient presents with   Hypertension   Medication Management    [name] is a 89 y.o. year old female who presented for a telephone visit with granddaughter [name]. Patient lives with her and [name] helps with managing medications.   They were referred to the pharmacist by their PCP for assistance in managing hypertension and complex medication management.    Subjective:  Care Team: Primary Care Provider: [name], MD ; Next Scheduled Visit: 08/07/23 Angela Buttner, MD at [facility] - Next Appt in March [address]  Medication Access/Adherence  Current Pharmacy:  [facility] [place], [place] - [address] [place] [address] Phone: [phone] Fax: [phone]  [facility] [address] [place] [address] Phone: [phone] Fax: [phone]   Patient reports affordability concerns with their medications: No  Patient reports access/transportation concerns to their pharmacy: No  Patient reports adherence concerns with their medications:  Yes  - difficult to get patient to take all three tabs of hydralazine at times, forgets   Hypertension:  Current medications: Amlodipine 10mg , Carvedilol 12.5mg  BID, Clonidine 0.1mg  BID prn (using only if SBP >180), Doxazosin 4mg  BID, Hydralazine 100mg  TID (missing doses occassionally), Metolazone 2.5mg  on Fridays, Lasix 40mg   Medications previously tried: Metoprolol, Olmesartan, Verapamil  Patient has a validated, automated, upper arm home BP cuff Current blood pressure readings readings: elevated at home, has been seeing SBPs in the 170s  Patient denies hypotensive s/sx including dizziness, lightheadedness.  Patient reports hypertensive symptoms including headache  Current meal patterns: reports little appetite, mindful of  salt  Current physical activity: mobile around house, still drives herself if needed, walks with assistance of cane when out of her house   Medication Management:  Current adherence strategy: Utilizing a tracking sheet with granddaughter  Patient reports Fair adherence to medications  Patient reports the following barriers to adherence: number of medications, dosing (TID hydralazine)  Requested refill on sodium bicarb that was previously started in hospital stay in June 2024 (was found to be acidotic on admission for pneumonia), has about 5 more days on hand.   Objective:  Lab Results  Component Value Date   HGBA1C 5.3 06/29/2022    Lab Results  Component Value Date   CREATININE 3.89 (H) 05/03/2023   BUN 38 (H) 05/03/2023   NA 143 05/03/2023   K 4.5 05/03/2023   CL 109 05/03/2023   CO2 22 05/03/2023    Lab Results  Component Value Date   CHOL 190 07/21/2022   HDL 92 07/21/2022   LDLCALC 81 07/21/2022   TRIG 97 07/21/2022   CHOLHDL 2.1 07/21/2022    Medications Reviewed Today   Medications were not reviewed in this encounter       Assessment/Plan:   Hypertension: - Currently uncontrolled - Reviewed long term cardiovascular and renal outcomes of uncontrolled blood pressure - Reviewed appropriate blood pressure monitoring technique and reviewed goal blood pressure. Recommended to check home blood pressure and heart rate 1-2x/day. - Recommend to take Clonidine BID prn SBP greater than or equal to 160 instead of waiting for BP to get above 180.  -Recommended scheduling follow up with cardio as they are wondering if isosorbide is still needed and patient worried it may be source of headaches. -Future consideration: Could consider dropping hydralazine down to BID dosing if clonidine prn results  in better control      Medication Management: - Currently strategy sufficient to maintain appropriate adherence to prescribed medication regimen -Patient managed by  nephrology for ESRD, recommended reaching out to them to take over prescribing of the sodium bicarb as this is typically managed by them. Instructed to reach back out if they decline to take over.    Follow Up Plan: 1 month  Sherrill Raring, PharmD Clinical Pharmacist [phone]

## 2023-06-06 NOTE — Progress Notes (Signed)
Paramedicine Encounter    Patient ID: [name], female    DOB: [birth year], 89 y.o.   MRN: [id number]   Complaints-sob at times  Edema-yes   Compliance with meds-yes for most part   Pill box filled-yes  If so, by whom-grand daughter   Refills needed-none at this time   She was seen by PCP recently and her citalopram was decreased . She reports feeling ok. Sometimes at night she feels sob,  She was also consulted today for her HTN.  Clonidine is PRN if BP is greater than 160.  She has referral to neuro and has appoint in feb.  She does have edema to her legs. G-daughter reports they were bigger than that 2 days ago. Advised her again to be sure to place compression stockings on first thing in the morning.  She had to use neb tx this morning. She does have a slight wheeze to rt lower lobe.  Denies sob at this time.  No fevers, no coughs.  I did relay her elevated b/p to clinic staff to just keep them in the loop since PCP has been involved with med changes.   Will f/u next week.    BP (!) 198/78   Pulse 60   Resp 16   Wt 152 lb (68.9 kg)   SpO2 95%   BMI 26.93 kg/m  Weight yesterday-152 Last visit weight-152  Patient Care Team: [name], MD as PCP - General (Internal Medicine) Nahser, Deloris Ping, MD as PCP - Cardiology (Cardiology) August Saucer, Corrie Mckusick, MD (Orthopedic Surgery) Malachy Mood, MD as Consulting Physician (Hematology) Bufford Buttner, MD as Consulting Physician (Nephrology) Mateo Flow, MD as Consulting Physician (Ophthalmology) Eber Jones, MD as Referring Physician (Ophthalmology) Delton Coombes Les Pou, MD as Consulting Physician (Pulmonary Disease) Leitha Bleak Milas Kocher, RPH (Pharmacist) Dante Gang, Md, MD (Cardiology) Dorthula Nettles, DO as Consulting Physician (Cardiology)  Patient Active Problem List   Diagnosis Date Noted   CAP (community acquired pneumonia) 10/31/2022   Diastolic congestive heart failure (HCC) 10/31/2022   Hypertensive urgency  10/31/2022   Moderate persistent asthma 10/31/2022   Hypomagnesemia 10/31/2022   CKD (chronic kidney disease), stage IV (HCC) 10/31/2022   Pulmonary hypertension, unspecified (HCC) 12/06/2021   Chronic cough 10/18/2021   Chronic rhinitis 11/24/2019   Nausea 11/03/2018   Elevated troponin 11/03/2018   Hyponatremia 11/03/2018   Hypertensive heart disease with hypertensive chronic kidney disease 11/30/2017   Fasting hyperglycemia 11/30/2017   Renal cyst 03/27/2016   CKD (chronic kidney disease) stage 3, GFR 30-59 ml/min (HCC) 08/23/2015   Subjective hearing change 06/29/2014   Resistant hypertension 06/29/2014   Lumbago 06/15/2014   Pre-diabetes vs early DM  06/15/2014   Dyspnea 04/27/2014   Asthma, chronic 04/13/2014   Elevated uric acid in blood 04/13/2014   Essential hypertension 03/27/2014   History of DVT (deep vein thrombosis)    Palpitations 01/05/2014   Anemia of chronic disease 13-Jan-2014   Death of family member 09/17/13   Leg edema 06/20/2013   Bereavement due to life event 04/21/2013   Other dysphagia 02/11/2013   Colon cancer screening 02/11/2013   Anxiety state 01/20/2013   Leg cramps 01/20/2013   Back pain 12/05/2012   Family hx of colon cancer 10/21/2012   Family hx of lung cancer 10/21/2012   Family hx-breast malignancy 10/21/2012   Renal insufficiency creatinine 1.3 10/21/2012   Anemia, chronic disease 10/21/2012   GERD (gastroesophageal reflux disease) 09/08/2012   HTN (hypertension) 09/08/2012   Hyperlipidemia 09/08/2012  Varicose veins of leg with complications 12/05/2010    Current Outpatient Medications:    acetaminophen (TYLENOL) 500 MG tablet, Take 500 mg by mouth in the morning and at bedtime., Disp: , Rfl:    albuterol (PROVENTIL) (2.5 MG/3ML) 0.083% nebulizer solution, Take 3 mLs (2.5 mg total) by nebulization every 6 (six) hours as needed for shortness of breath or wheezing., Disp: 360 mL, Rfl: 6   albuterol (VENTOLIN HFA) 108 (90 Base)  MCG/ACT inhaler, USE 2 PUFFS EVERY 6 HOURS AS NEEDED FOR WHEEZING., Disp: 8.5 g, Rfl: 3   amLODipine (NORVASC) 10 MG tablet, Take 1 tablet (10 mg total) by mouth daily. (Patient taking differently: Take 10 mg by mouth at bedtime.), Disp: 30 tablet, Rfl: 1   atorvastatin (LIPITOR) 40 MG tablet, Take 1 tablet (40 mg total) by mouth daily. (Patient taking differently: Take 40 mg by mouth every evening.), Disp: 90 tablet, Rfl: 3   atropine 1 % ophthalmic solution, Place 1 drop into both eyes 2 (two) times daily as needed., Disp: , Rfl:    Azelastine-Fluticasone 137-50 MCG/ACT SUSP, PLACE ONE SPRAY IN EACH NOSTRIL TWICE DAILY (IN THE MORNING AND AT BEDTIME) (Patient taking differently: Place 1 spray into both nostrils in the morning and at bedtime.), Disp: 23 g, Rfl: 6   Budeson-Glycopyrrol-Formoterol (BREZTRI AEROSPHERE) 160-9-4.8 MCG/ACT AERO, INHALE 2 PUFFS INTO THE LUNGS TWICE DAILY, IN THE MORNING & AT BEDTIME., Disp: 10.7 g, Rfl: 11   carvedilol (COREG) 12.5 MG tablet, Take 1 tablet (12.5 mg total) by mouth 2 (two) times daily., Disp: 180 tablet, Rfl: 3   cetirizine (ZYRTEC) 10 MG tablet, Take 10 mg by mouth daily as needed for allergies or rhinitis., Disp: , Rfl:    citalopram (CELEXA) 20 MG tablet, Take 0.5 tablets (10 mg total) by mouth daily., Disp: 45 tablet, Rfl: 1   cloNIDine (CATAPRES) 0.1 MG tablet, TAKE ONE TABLET BY MOUTH TWICE DAILY AS NEEDED for hypertension, Disp: 180 tablet, Rfl: 1   clopidogrel (PLAVIX) 75 MG tablet, Take 1 tablet (75 mg total) by mouth daily., Disp: 30 tablet, Rfl: 1   diclofenac Sodium (VOLTAREN) 1 % GEL, Apply 4 g topically 2 (two) times daily as needed (painful areas)., Disp: 50 g, Rfl: 1   doxazosin (CARDURA) 4 MG tablet, Take 1 tablet (4 mg total) by mouth 2 (two) times daily., Disp: 60 tablet, Rfl: 6   ferrous sulfate 325 (65 FE) MG EC tablet, Take 1 tablet (325 mg total) by mouth daily with breakfast. (Patient taking differently: Take 325 mg by mouth.), Disp: 30  tablet, Rfl: 3   fluticasone (FLONASE) 50 MCG/ACT nasal spray, Place 2 sprays into both nostrils as needed for allergies or rhinitis., Disp: , Rfl:    folic acid (FOLVITE) 1 MG tablet, Take 1 tablet (1 mg total) by mouth daily., Disp: 30 tablet, Rfl: 0   hydrALAZINE (APRESOLINE) 100 MG tablet, Take 1 tablet (100 mg total) by mouth 3 (three) times daily. (Patient taking differently: Take 100 mg by mouth 3 (three) times daily.), Disp: 270 tablet, Rfl: 2   isosorbide mononitrate (IMDUR) 30 MG 24 hr tablet, Take 1 tablet (30 mg total) by mouth daily., Disp: 90 tablet, Rfl: 3   LORazepam (ATIVAN) 0.5 MG tablet, Take 1 tablet (0.5 mg total) by mouth 2 (two) times daily as needed. for anxiety, Disp: 30 tablet, Rfl: 0   metolazone (ZAROXOLYN) 2.5 MG tablet, Take 1 tablet (2.5 mg total) by mouth once a week. Take every Friday, Disp:  15 tablet, Rfl: 3   montelukast (SINGULAIR) 10 MG tablet, Take 1 tablet (10 mg total) by mouth daily. (Patient taking differently: Take 10 mg by mouth at bedtime.), Disp: 90 tablet, Rfl: 3   oxyCODONE (OXY IR/ROXICODONE) 5 MG immediate release tablet, Take 1 tablet (5 mg total) by mouth every 6 (six) hours as needed for severe pain (pain score 7-10)., Disp: 20 tablet, Rfl: 0   pantoprazole (PROTONIX) 40 MG tablet, Take 1 tablet (40 mg total) by mouth daily. (Patient taking differently: Take 40 mg by mouth 2 (two) times daily.), Disp: 90 tablet, Rfl: 3   potassium chloride (KLOR-CON M) 10 MEQ tablet, Take 10 mEq by mouth daily. Take an extra 20 meq (two tablets) on Fridays (this is the day you take Metolazone), Disp: , Rfl:    RESTASIS 0.05 % ophthalmic emulsion, Place 1 drop into both eyes 2 (two) times daily., Disp: , Rfl:    sodium bicarbonate 650 MG tablet, take ONE tab twice a DAY (Patient taking differently: Take 650 mg by mouth 2 (two) times daily.), Disp: 60 tablet, Rfl: 0   Spacer/Aero-Holding Chambers (AEROCHAMBER PLUS) inhaler, Use as instructed (Patient taking differently:  Use as instructed as needed), Disp: 1 each, Rfl: 2   vitamin B-12 (CYANOCOBALAMIN) 1000 MCG tablet, Take 1,000 mcg by mouth daily., Disp: , Rfl:    Vitamin D, Cholecalciferol, 25 MCG (1000 UT) TABS, Take 1,000 Units by mouth daily., Disp: 60 tablet, Rfl: 2   furosemide (LASIX) 40 MG tablet, Take 1.5 tablets (60 mg total) by mouth 2 (two) times daily., Disp: 270 tablet, Rfl: 3 Allergies  Allergen Reactions   Penicillins Nausea And Vomiting and Other (See Comments)    Has patient had a PCN reaction causing immediate rash, facial/tongue/throat swelling, SOB or lightheadedness with hypotension: Y Has patient had a PCN reaction causing severe rash involving mucus membranes or skin necrosis: Y Has patient had a PCN reaction that required hospitalization: N Has patient had a PCN reaction occurring within the last 10 years: N If all of the above answers are "NO", then may proceed with Cephalosporin use.    Aspirin Other (See Comments)    Wheezing Acetaminophen is OK       Social History   Socioeconomic History   Marital status: Single    Spouse name: Not on file   Number of children: 6   Years of education: Not on file   Highest education level: Not on file  Occupational History   Occupation: retired  Tobacco Use   Smoking status: Never   Smokeless tobacco: Never  Vaping Use   Vaping status: Never Used  Substance and Sexual Activity   Alcohol use: No   Drug use: No   Sexual activity: Not Currently    Birth control/protection: Surgical  Other Topics Concern   Not on file  Social History Narrative   2 people living in the home.  Grand daughter.   Up and down through the night      Had 7 children  2 deceased . 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    works 3 days per week currently .   Glasses dentures  Neg tad    Social Drivers of Health   Financial Resource Strain: Low Risk  (04/14/2022)    Overall Financial Resource Strain (CARDIA)    Difficulty of Paying Living Expenses: Not very hard  Food Insecurity: No Food Insecurity (12/01/2022)   Hunger Vital Sign    Worried About Running Out of Food in the Last Year: Never true    Ran Out of Food in the Last Year: Never true  Transportation Needs: No Transportation Needs (12/01/2022)   PRAPARE - Administrator, Civil Service (Medical): No    Lack of Transportation (Non-Medical): No  Physical Activity: Not on file  Stress: Not on file  Social Connections: Not on file  Intimate Partner Violence: Not At Risk (10/31/2022)   Humiliation, Afraid, Rape, and Kick questionnaire    Fear of Current or Ex-Partner: No    Emotionally Abused: No    Physically Abused: No    Sexually Abused: No    Physical Exam      Future Appointments  Date Time Provider Department Center  06/07/2023 12:30 PM [facility] NM2/TREAD [id number]  06/19/2023  1:00 PM [facility] LAB [facility] None  06/19/2023  1:45 PM [facility] MED[facility] FLUSH [facility] None  06/27/2023  1:15 PM [facility] NURSE [facility] None  06/27/2023  1:30 PM Marcos Eke, PA-C [facility] None  06/28/2023  4:40 PM Terressa Koyanagi, DO [facility]  07/11/2023  1:30 PM [facility] CCM PHARMACIST [facility]  07/17/2023  1:00 PM [facility] LAB [facility] None  07/17/2023  1:30 PM Carlean Jews, NP [facility] None  07/17/2023  2:15 PM [facility] MED[facility] FLUSH [facility] None  08/07/2023  2:30 PM Panosh, Neta Mends, MD [facility]       Kerry Hough, Paramedic [phone] [facility] Paramedic  06/06/23

## 2023-06-07 ENCOUNTER — Ambulatory Visit (HOSPITAL_COMMUNITY): Payer: Medicare Other | Attending: Cardiovascular Disease

## 2023-06-07 ENCOUNTER — Telehealth (HOSPITAL_COMMUNITY): Payer: Self-pay | Admitting: Cardiology

## 2023-06-07 DIAGNOSIS — I5032 Chronic diastolic (congestive) heart failure: Secondary | ICD-10-CM | POA: Diagnosis not present

## 2023-06-07 LAB — MYOCARDIAL AMYLOID PLANAR & SPECT: H/CL Ratio: 1.16

## 2023-06-07 MED ORDER — TECHNETIUM TC 99M PYROPHOSPHATE
21.9000 | Freq: Once | INTRAVENOUS | Status: AC
  Administered 2023-06-07: 21.9 via INTRAVENOUS

## 2023-06-07 NOTE — Telephone Encounter (Signed)
-----   Message from [facility] Katie L sent at 06/06/2023  4:56 PM EST ----- Regarding: b/p HF Paramedicine Message to [facility]  Pharmacy (if applicable):   Issue/reason for call: continued high b/p   Medication refill?   Blood pressure? 198/78  Weight gain? (How much over a week?) Weight loss? (How much over a week?)  Edema ? (Baseline or worse?)yes   Dyspnea?  (Baseline or worse?)    Medications are taken as prescribed?  Most of time     Just wanted to pass along that she is still struggling with high b/p, her PCP has been changing some meds. Most recently added clonidine PRN if over 160 systolic, decreased citalopram and there was a note about possibly stopping isosorbide b/c she is having a lot of headaches.  Jut wanted to keep yall in the loop.   She was seen in dec, due again in April.  She does have the PYP scan tomor also.     Thanks, Kerry Hough, EMT-Paramedic  [phone] 06/06/2023

## 2023-06-08 NOTE — Telephone Encounter (Signed)
Pt aware   Message to [facility] as Lorain Childes

## 2023-06-19 ENCOUNTER — Other Ambulatory Visit (HOSPITAL_COMMUNITY): Payer: Self-pay | Admitting: *Deleted

## 2023-06-19 ENCOUNTER — Inpatient Hospital Stay: Payer: Medicare Other

## 2023-06-19 ENCOUNTER — Telehealth (HOSPITAL_COMMUNITY): Payer: Self-pay

## 2023-06-19 ENCOUNTER — Inpatient Hospital Stay: Payer: Medicare Other | Attending: Hematology

## 2023-06-19 ENCOUNTER — Other Ambulatory Visit: Payer: Self-pay

## 2023-06-19 VITALS — BP 132/54 | HR 66 | Temp 98.0°F | Resp 16

## 2023-06-19 DIAGNOSIS — I5032 Chronic diastolic (congestive) heart failure: Secondary | ICD-10-CM

## 2023-06-19 DIAGNOSIS — Z79899 Other long term (current) drug therapy: Secondary | ICD-10-CM | POA: Diagnosis not present

## 2023-06-19 DIAGNOSIS — N183 Chronic kidney disease, stage 3 unspecified: Secondary | ICD-10-CM | POA: Insufficient documentation

## 2023-06-19 DIAGNOSIS — D638 Anemia in other chronic diseases classified elsewhere: Secondary | ICD-10-CM

## 2023-06-19 DIAGNOSIS — I129 Hypertensive chronic kidney disease with stage 1 through stage 4 chronic kidney disease, or unspecified chronic kidney disease: Secondary | ICD-10-CM | POA: Diagnosis not present

## 2023-06-19 DIAGNOSIS — D631 Anemia in chronic kidney disease: Secondary | ICD-10-CM | POA: Diagnosis not present

## 2023-06-19 LAB — CBC WITH DIFFERENTIAL (CANCER CENTER ONLY)
Abs Immature Granulocytes: 0.04 10*3/uL (ref 0.00–0.07)
Basophils Absolute: 0 10*3/uL (ref 0.0–0.1)
Basophils Relative: 1 %
Eosinophils Absolute: 0.1 10*3/uL (ref 0.0–0.5)
Eosinophils Relative: 3 %
HCT: 27.7 % — ABNORMAL LOW (ref 36.0–46.0)
Hemoglobin: 8.5 g/dL — ABNORMAL LOW (ref 12.0–15.0)
Immature Granulocytes: 1 %
Lymphocytes Relative: 28 %
Lymphs Abs: 1 10*3/uL (ref 0.7–4.0)
MCH: 29.2 pg (ref 26.0–34.0)
MCHC: 30.7 g/dL (ref 30.0–36.0)
MCV: 95.2 fL (ref 80.0–100.0)
Monocytes Absolute: 0.3 10*3/uL (ref 0.1–1.0)
Monocytes Relative: 7 %
Neutro Abs: 2 10*3/uL (ref 1.7–7.7)
Neutrophils Relative %: 60 %
Platelet Count: 265 10*3/uL (ref 150–400)
RBC: 2.91 MIL/uL — ABNORMAL LOW (ref 3.87–5.11)
RDW: 15.2 % (ref 11.5–15.5)
WBC Count: 3.4 10*3/uL — ABNORMAL LOW (ref 4.0–10.5)
nRBC: 0 % (ref 0.0–0.2)

## 2023-06-19 LAB — FERRITIN: Ferritin: 306 ng/mL (ref 11–307)

## 2023-06-19 LAB — SAMPLE TO BLOOD BANK

## 2023-06-19 MED ORDER — FUROSEMIDE 40 MG PO TABS: 60.0000 mg | ORAL_TABLET | Freq: Two times a day (BID) | ORAL | 3 refills | Status: DC

## 2023-06-19 MED ORDER — DARBEPOETIN ALFA 100 MCG/0.5ML IJ SOSY
100.0000 ug | PREFILLED_SYRINGE | Freq: Once | INTRAMUSCULAR | Status: AC
Start: 2023-06-19 — End: 2023-06-19
  Administered 2023-06-19: 100 ug via SUBCUTANEOUS
  Filled 2023-06-19: qty 0.5

## 2023-06-19 NOTE — Telephone Encounter (Signed)
Called and left a message with [name] to attempt a follow up for a paramedicine visit due to [name] being out. She did not answer- I will continue to follow up.  [name], EMT-Paramedic [phone] 06/19/2023

## 2023-06-21 ENCOUNTER — Encounter: Payer: Medicare Other | Admitting: Family Medicine

## 2023-06-21 NOTE — Progress Notes (Deleted)
 PATIENT CHECK-IN and HEALTH RISK ASSESSMENT QUESTIONNAIRE:  -completed by phone/video for upcoming Medicare Preventive Visit  -PLEASE SELECT NOT IN PERSON for the method of visit.   FIRST check to see if the patient completed the online questionnaire - if so, this can be found under the rooming tab, then go to the questionnaires tab. Some of the questions are the same and you can use the answers to complete all of the bold questions below before calling the patient. Question #s below: 6 (fall risk screening), under habits # 1, 2, 3, 8 and 9,  under everyday activities #2, 3 and 8.   Pre-Visit Check-in: 1)Vitals (height, wt, BP, etc) - record in vitals section for visit on day of visit Request home vitals (wt, BP, etc.) and enter into vitals, THEN update Vital Signs SmartPhrase below at the top of the HPI. See below.  2)Review and Update Medications, Allergies PMH, Surgeries, Social history in Epic 3)Hospitalizations in the last year with date/reason? ***  4)Review and Update Care Team (patient's specialists) in Epic 5) Complete PHQ9 in Epic  6) Complete Fall Screening in Epic 7)Review all Health Maintenance Due and order under PCP if not done.  Medicare Wellness Patient Questionnaire:  Answer theses question about your habits: How often do you have a drink containing alcohol ?*** How many drinks containing alcohol  do you have on a typical day when you are drinking?*** How often do you have six or more drinks on one occasion?*** Have you ever smoked?*** Quit date if applicable? ***  How many packs a day do/did you smoke? *** Do you use smokeless tobacco?*** Do you use an illicit drugs?*** On average, how many days per week do you engage in moderate to strenuous exercise (like a brisk walk)?*** On average, how many minutes do you engage in exercise at this level?*** Are you sexually active? ***Number of partners?*** Typical breakfast**** Typical lunch*** Typical dinner*** Typical  snacks:****  Beverages: ***  Answer theses question about your everyday activities: Can you perform most household chores?*** Are you deaf or have significant trouble hearing?*** Do you feel that you have a problem with memory?*** Do you feel safe at home?*** Last dentist visit?*** 8. Do you have any difficulty performing your everyday activities?*** Are you having any difficulty walking, taking medications on your own, and or difficulty managing daily home needs?*** Do you have difficulty walking or climbing stairs?*** Do you have difficulty dressing or bathing?*** Do you have difficulty doing errands alone such as visiting a doctor's office or shopping?*** Do you currently have any difficulty preparing food and eating?*** Do you currently have any difficulty using the toilet?*** Do you have any difficulty managing your finances?*** Do you have any difficulties with housekeeping of managing your housekeeping?***   Do you have Advanced Directives in place (Living Will, Healthcare Power or Attorney)? ***   Last eye Exam and location?***   Do you currently use prescribed or non-prescribed narcotic or opioid pain medications?***  Do you have a history or close family history of breast, ovarian, tubal or peritoneal cancer or a family member with BRCA (breast cancer susceptibility 1 and 2) gene mutations?  ***Request home vitals (wt, BP, etc.) and enter into vitals, THEN update Vital Signs SmartPhrase below at the top of the HPI. See below.   Nurse/Assistant Credentials/time stamp:    ----------------------------------------------------------------------------------------------------------------------------------------------------------------------------------------------------------------------  Because this visit was a virtual/telehealth visit, some criteria may be missing or patient reported. Any vitals not documented were not able to be obtained and  vitals that have been  documented are patient reported.    MEDICARE ANNUAL PREVENTIVE VISIT WITH PROVIDER: (Welcome to Medicare, initial annual wellness or annual wellness exam)  Virtual Visit via Video***Phone Note  I connected with [name] on 06/21/23 by phone *** a video enabled telemedicine application and verified that I am speaking with the correct person using two identifiers.  Location patient: home Location provider:work or home office Persons participating in the virtual visit: patient, provider  Concerns and/or follow up today:   See HM section in Epic for other details of completed HM.    ROS: negative for report of fevers, unintentional weight loss, vision changes, vision loss, hearing loss or change, chest pain, sob, hemoptysis, melena, hematochezia, hematuria, falls, bleeding or bruising, thoughts of suicide or self harm, memory loss  Patient-completed extensive health risk assessment - reviewed and discussed with the patient: See Health Risk Assessment completed with patient prior to the visit either above or in recent phone note. This was reviewed in detailed with the patient today and appropriate recommendations, orders and referrals were placed as needed per Summary below and patient instructions.   Review of Medical History: -PMH, PSH, Family History and current specialty and care providers reviewed and updated and listed below   Patient Care Team: Panosh, Apolinar POUR, MD as PCP - General (Internal Medicine) Nahser, Aleene PARAS, MD as PCP - Cardiology (Cardiology) Addie, Cordella Hamilton, MD (Orthopedic Surgery) Lanny Callander, MD as Consulting Physician (Hematology) Gearline Norris, MD as Consulting Physician (Nephrology) Cleatus Collar, MD as Consulting Physician (Ophthalmology) Maree Paticia BRAVO, MD as Referring Physician (Ophthalmology) Shelah Lamar RAMAN, MD as Consulting Physician (Pulmonary Disease) Lionell Jon DEL, RPH (Pharmacist) Claudette, Md, MD (Cardiology) Gardenia Led, DO as  Consulting Physician (Cardiology)   Past Medical History:  Diagnosis Date   Acute superficial venous thrombosis of left lower extremity 03/27/2014   concern about hx and potential extensive on exam tenderness calf and low dose lovenox  40 qd 2-4 weekselevetion and close  fu advised .    Allergy    Anemia    Anxiety    Arthritis    shoulders (09/09/2012)   Asthma 09/08/2012   Carotid bruit    us  2018  low risk 1 - 39%   Cataract    bil cateracts removed   Chronic kidney disease    Clotting disorder (HCC)    blood clots in legs   Diabetes mellitus without complication (HCC)    Borderline per pt; being monitored.  no meds per pt   Diverticulosis    Dyspnea 04/27/2014   GERD (gastroesophageal reflux disease)    Headache(784.0)    related to my high blood pressure (09/09/2012)   Hiatal hernia    History of DVT (deep vein thrombosis)    RLE (09/09/2012) coumadine cant take asa so on plavix     HTN (hypertension) 09/08/2012   Hyperlipidemia    Lupus    cured years ago (09/09/2012)   Other dysphagia 02/11/2013   Syncope and collapse 01/21/2018   Varicose veins    Varicose veins of leg with complications 12/05/2010    Past Surgical History:  Procedure Laterality Date   ABDOMINAL HYSTERECTOMY     partial   BREAST EXCISIONAL BIOPSY Right    BREAST SURGERY     CARPAL TUNNEL RELEASE Right 2000's   CARPAL TUNNEL RELEASE Bilateral 10/21/2013   Procedure: BILATERAL  CARPAL TUNNEL RELEASE;  Surgeon: Arley JONELLE Curia, MD;  Location: [facility];  Service:  Orthopedics;  Laterality: Bilateral;   COLONOSCOPY     SHOULDER OPEN ROTATOR CUFF REPAIR Bilateral 2000's   TRIGGER FINGER RELEASE Right 10/21/2013   Procedure: RELEASE TRIGGER FINGER/A-1 PULLEY RIGHT MIDDLE AND RIGHT RING;  Surgeon: Arley JONELLE Curia, MD;  Location: [facility];  Service: Orthopedics;  Laterality: Right;   UPPER GASTROINTESTINAL ENDOSCOPY      Social History   Socioeconomic History    Marital status: Single    Spouse name: Not on file   Number of children: 6   Years of education: Not on file   Highest education level: Not on file  Occupational History   Occupation: retired  Tobacco Use   Smoking status: Never   Smokeless tobacco: Never  Vaping Use   Vaping status: Never Used  Substance and Sexual Activity   Alcohol  use: No   Drug use: No   Sexual activity: Not Currently    Birth control/protection: Surgical  Other Topics Concern   Not on file  Social History Narrative   2 people living in the home.  Grand daughter.   Up and down through the night      Had 7 children  2 deceased . 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    works 3 days per week currently .   Glasses dentures  Neg tad    Social Drivers of Health   Financial Resource Strain: Low Risk  (04/14/2022)   Overall Financial Resource Strain (CARDIA)    Difficulty of Paying Living Expenses: Not very hard  Food Insecurity: No Food Insecurity (12/01/2022)   Hunger Vital Sign    Worried About Running Out of Food in the Last Year: Never true    Ran Out of Food in the Last Year: Never true  Transportation Needs: No Transportation Needs (12/01/2022)   PRAPARE - Administrator, Civil Service (Medical): No    Lack of Transportation (Non-Medical): No  Physical Activity: Not on file  Stress: Not on file  Social Connections: Not on file  Intimate Partner Violence: Not At Risk (10/31/2022)   Humiliation, Afraid, Rape, and Kick questionnaire    Fear of Current or Ex-Partner: No    Emotionally Abused: No    Physically Abused: No    Sexually Abused: No    Family History  Problem Relation Age of Onset   Hypertension Mother    Lung cancer Mother    Hypertension Father    Lung cancer Father    Breast cancer Sister    Colon cancer Sister        ? 24' s dx - died in 53's   Breast cancer Sister    Hypertension Brother     Rectal cancer Brother    Stomach cancer Brother    Breast cancer Brother    Heart attack Daughter    Esophageal cancer Daughter    Hypertension Daughter    Diabetes Daughter    Breast cancer Paternal Aunt    Lung cancer Other        both parents    Pancreatic cancer Neg Hx     Current Outpatient Medications on File Prior to Visit  Medication Sig Dispense Refill   acetaminophen  (TYLENOL ) 500 MG tablet Take 500 mg by mouth in the morning and at bedtime.     albuterol  (PROVENTIL ) (2.5 MG/3ML) 0.083% nebulizer solution Take 3 mLs (2.5 mg  total) by nebulization every 6 (six) hours as needed for shortness of breath or wheezing. 360 mL 6   albuterol  (VENTOLIN  HFA) 108 (90 Base) MCG/ACT inhaler USE 2 PUFFS EVERY 6 HOURS AS NEEDED FOR WHEEZING. 8.5 g 3   amLODipine  (NORVASC ) 10 MG tablet Take 1 tablet (10 mg total) by mouth daily. (Patient taking differently: Take 10 mg by mouth at bedtime.) 30 tablet 1   atorvastatin  (LIPITOR) 40 MG tablet Take 1 tablet (40 mg total) by mouth daily. (Patient taking differently: Take 40 mg by mouth every evening.) 90 tablet 3   atropine 1 % ophthalmic solution Place 1 drop into both eyes 2 (two) times daily as needed.     Azelastine -Fluticasone  137-50 MCG/ACT SUSP PLACE ONE SPRAY IN EACH NOSTRIL TWICE DAILY (IN THE MORNING AND AT BEDTIME) (Patient taking differently: Place 1 spray into both nostrils in the morning and at bedtime.) 23 g 6   Budeson-Glycopyrrol-Formoterol  (BREZTRI  AEROSPHERE) 160-9-4.8 MCG/ACT AERO INHALE 2 PUFFS INTO THE LUNGS TWICE DAILY, IN THE MORNING & AT BEDTIME. 10.7 g 11   carvedilol  (COREG ) 12.5 MG tablet Take 1 tablet (12.5 mg total) by mouth 2 (two) times daily. 180 tablet 3   cetirizine (ZYRTEC) 10 MG tablet Take 10 mg by mouth daily as needed for allergies or rhinitis.     citalopram  (CELEXA ) 20 MG tablet Take 0.5 tablets (10 mg total) by mouth daily. 45 tablet 1   cloNIDine  (CATAPRES ) 0.1 MG tablet TAKE ONE TABLET BY MOUTH TWICE DAILY  AS NEEDED for hypertension 180 tablet 1   clopidogrel  (PLAVIX ) 75 MG tablet Take 1 tablet (75 mg total) by mouth daily. 30 tablet 1   diclofenac  Sodium (VOLTAREN ) 1 % GEL Apply 4 g topically 2 (two) times daily as needed (painful areas). 50 g 1   doxazosin  (CARDURA ) 4 MG tablet Take 1 tablet (4 mg total) by mouth 2 (two) times daily. 60 tablet 6   ferrous sulfate  325 (65 FE) MG EC tablet Take 1 tablet (325 mg total) by mouth daily with breakfast. (Patient taking differently: Take 325 mg by mouth.) 30 tablet 3   fluticasone  (FLONASE ) 50 MCG/ACT nasal spray Place 2 sprays into both nostrils as needed for allergies or rhinitis.     folic acid  (FOLVITE ) 1 MG tablet Take 1 tablet (1 mg total) by mouth daily. 30 tablet 0   furosemide  (LASIX ) 40 MG tablet Take 1.5 tablets (60 mg total) by mouth 2 (two) times daily. 270 tablet 3   hydrALAZINE  (APRESOLINE ) 100 MG tablet Take 1 tablet (100 mg total) by mouth 3 (three) times daily. (Patient taking differently: Take 100 mg by mouth 3 (three) times daily.) 270 tablet 2   isosorbide  mononitrate (IMDUR ) 30 MG 24 hr tablet Take 1 tablet (30 mg total) by mouth daily. 90 tablet 3   LORazepam  (ATIVAN ) 0.5 MG tablet Take 1 tablet (0.5 mg total) by mouth 2 (two) times daily as needed. for anxiety 30 tablet 0   metolazone  (ZAROXOLYN ) 2.5 MG tablet Take 1 tablet (2.5 mg total) by mouth once a week. Take every Friday 15 tablet 3   montelukast  (SINGULAIR ) 10 MG tablet Take 1 tablet (10 mg total) by mouth daily. (Patient taking differently: Take 10 mg by mouth at bedtime.) 90 tablet 3   oxyCODONE  (OXY IR/ROXICODONE ) 5 MG immediate release tablet Take 1 tablet (5 mg total) by mouth every 6 (six) hours as needed for severe pain (pain score 7-10). 20 tablet 0   pantoprazole  (PROTONIX ) 40  MG tablet Take 1 tablet (40 mg total) by mouth daily. (Patient taking differently: Take 40 mg by mouth 2 (two) times daily.) 90 tablet 3   potassium chloride  (KLOR-CON  M) 10 MEQ tablet Take 10  mEq by mouth daily. Take an extra 20 meq (two tablets) on Fridays (this is the day you take Metolazone )     RESTASIS 0.05 % ophthalmic emulsion Place 1 drop into both eyes 2 (two) times daily.     sodium bicarbonate  650 MG tablet take ONE tab twice a DAY (Patient taking differently: Take 650 mg by mouth 2 (two) times daily.) 60 tablet 0   Spacer/Aero-Holding Chambers (AEROCHAMBER PLUS) inhaler Use as instructed (Patient taking differently: Use as instructed as needed) 1 each 2   vitamin B-12 (CYANOCOBALAMIN ) 1000 MCG tablet Take 1,000 mcg by mouth daily.     Vitamin D , Cholecalciferol , 25 MCG (1000 UT) TABS Take 1,000 Units by mouth daily. 60 tablet 2   [DISCONTINUED] potassium chloride  (KLOR-CON ) 10 MEQ tablet TAKE FOUR TABLETS BY MOUTH DAILY. (Patient taking differently: Take 20 mEq by mouth 2 (two) times daily.) 120 tablet 1   No current facility-administered medications on file prior to visit.    Allergies  Allergen Reactions   Penicillins Nausea And Vomiting and Other (See Comments)    Has patient had a PCN reaction causing immediate rash, facial/tongue/throat swelling, SOB or lightheadedness with hypotension: Y Has patient had a PCN reaction causing severe rash involving mucus membranes or skin necrosis: Y Has patient had a PCN reaction that required hospitalization: N Has patient had a PCN reaction occurring within the last 10 years: N If all of the above answers are NO, then may proceed with Cephalosporin use.    Aspirin Other (See Comments)    Wheezing Acetaminophen  is OK        Physical Exam Vitals requested from patient and listed below if patient had equipment and was able to obtain at home for this virtual visit: There were no vitals filed for this visit. Estimated body mass index is 26.57 kg/m as calculated from the following:   Height as of 06/07/23: 5' 3 (1.6 m).   Weight as of 06/07/23: 150 lb (68 kg).  EKG (optional): deferred due to virtual visit  GENERAL:  alert, oriented, no acute distress detected, full vision exam deferred due to pandemic and/or virtual encounter  *** HEENT: atraumatic, conjunttiva clear, no obvious abnormalities on inspection of external nose and ears  NECK: normal movements of the head and neck  LUNGS: on inspection no signs of respiratory distress, breathing rate appears normal, no obvious gross SOB, gasping or wheezing  CV: no obvious cyanosis  MS: moves all visible extremities without noticeable abnormality  PSYCH/NEURO: pleasant and cooperative, no obvious depression or anxiety, speech and thought processing grossly intact, Cognitive function grossly intact  Flowsheet Row Office Visit from [facility]/16/2023 in [facility]  PHQ-9 Total Score 0           01/16/2023   12:07 PM 04/27/2022    4:02 PM 09/26/2021   10:42 AM 07/21/2020   11:13 AM 05/21/2019   10:45 AM  Depression screen PHQ 2/9  Decreased Interest 0 0 1 0 0  Down, Depressed, Hopeless 0 0 0 0 0  PHQ - 2 Score 0 0 1 0 0  Altered sleeping 0 0 0 0   Tired, decreased energy 0 1 0 1   Change in appetite 0 0 0 0   Feeling  bad or failure about yourself  0 0 0 0   Trouble concentrating 0 0 0 0   Moving slowly or fidgety/restless 0 0 0 0   Suicidal thoughts 0 0 0 0   PHQ-9 Score 0 1 1 1    Difficult doing work/chores Not difficult at all Not difficult at all Not difficult at all Not difficult at all        04/27/2022    4:04 PM 06/29/2022   12:02 PM 12/01/2022   12:51 PM 01/16/2023   12:06 PM 04/19/2023    2:02 PM  Fall Risk  Falls in the past year? 0 0 0 0 0  Was there an injury with Fall? 0 0  0   Fall Risk Category Calculator 0 0  0   Fall Risk Category (Retired) Low      (RETIRED) Patient Fall Risk Level Low fall risk      Patient at Risk for Falls Due to No Fall Risks No Fall Risks  No Fall Risks   Fall risk Follow up Falls evaluation completed Falls evaluation completed  Falls evaluation completed      SUMMARY AND  PLAN:  No diagnosis found.  Visit coding *** [id number] (annual wellness visit -initial); G0439 (annual wellness subsequent); G0402 Welcome to Medicare(initial preventive physical exam)   Discussed applicable health maintenance/preventive health measures and advised and referred or ordered per patient preferences:  Health Maintenance  Topic Date Due   FOOT EXAM  Never done   DEXA SCAN  Never done   OPHTHALMOLOGY EXAM  06/10/2021   HEMOGLOBIN A1C  12/28/2022   COVID-19 Vaccine (7 - 2024-25 season) 01/14/2023   Medicare Annual Wellness (AWV)  04/28/2023   DTaP/Tdap/Td (2 - Tdap) 04/03/2024   Pneumonia Vaccine 35+ Years old  Completed   INFLUENZA VACCINE  Completed   Zoster Vaccines- Shingrix  Completed   HPV VACCINES  Aged Out      Education and counseling on the following was provided based on the above review of health and a plan/checklist for the patient, along with additional information discussed, was provided for the patient in the patient instructions :  -Advised on importance of completing advanced directives, discussed options for completing and provided information in patient instructions as well -Provided counseling and plan for difficulty hearing  -Provided counseling and plan for increased risk of falling if applicable per above screening. Reviewed and demonstrated safe balance exercises that can be done at home to improve balance and discussed exercise guidelines for adults with include balance exercises at least 3 days per week.  -Advised and counseled on a healthy lifestyle - including the importance of a healthy diet, regular physical activity, social connections and stress management. -Reviewed patient's current diet. Advised and counseled on a whole foods based healthy diet. A summary of a healthy diet was provided in the Patient Instructions.  -reviewed patient's current physical activity level and discussed exercise guidelines for adults. Discussed community resources  and ideas for safe exercise at home to assist in meeting exercise guideline recommendations in a safe and healthy way.  -Advise yearly dental visits at minimum and regular eye exams -Advised and counseled on alcohol  safe limits, risks/ tobacco use, risks of smoking and offered counseling/help, drug, opoid use/misuse   Follow up: see patient instructions     There are no Patient Instructions on file for this visit.  Chiquita JONELLE Cramp, DO

## 2023-06-24 NOTE — Progress Notes (Signed)
Assessment/Plan:     [name] is a very pleasant 89 y.o. year old RH female with a history of hypertension, hyperlipidemia, anemia seen today for evaluation of memory loss. MoCA today is .  Workup is in progress.    Memory Impairment  MRI brain without contrast to assess for underlying structural abnormality and assess vascular load  Neurocognitive testing to further evaluate cognitive concerns and determine other underlying cause of memory changes, including potential contribution from sleep, anxiety, attention, or depression among others  Check B12, TSH Recommend good control of cardiovascular risk factors.   Continue to control mood as per PCP Folllow up in   Headaches, unclear etiology May be exacerbated by anemia (H/H 8.5/27.7), dehydration among other causes.  Check MRI brain for structural abnormalities and vascular load   For preventative management, ***  For abortive therapy, ***  Limit use of pain relievers to no more than 2 days out of week to prevent risk of rebound or medication-overuse headache.  Keep headache diary  Exercise, hydration, caffeine cessation, sleep hygiene, monitor for and avoid triggers  Consider:  magnesium citrate 400mg  daily, riboflavin 400mg  daily, and coenzyme Q10 100mg  three times daily Always keep in mind that currently taking a hormone or birth control may be a possible trigger or aggravating factor for migraine. Follow up ***   Subjective:    The patient is accompanied byher daughter and grandaughter ***  who supplements the history.    How long did patient have memory difficulties?  For about.  Patient reports some difficulty remembering new information, recent conversations, names. repeats oneself?  Endorsed Disoriented when walking into a room?  Patient denies ***  Leaving objects in unusual places?  denies   Wandering behavior? denies   Any personality changes, or depression, anxiety? denies*** Hallucinations or  paranoia? denies   Seizures? denies    Any sleep changes?  Sleeps well ***/does not sleep well. ***vivid dreams, REM behavior or sleepwalking   Sleep apnea? Denies.   Any hygiene concerns?  Denies.   Independent of bathing and dressing? Endorsed  Does the patient need help with medications?  is in charge *** Who is in charge of the finances?  is in charge   *** Any changes in appetite?   Denies. ***   Patient have trouble swallowing?  Denies.   Does the patient cook? No*** Any headaches? Endorsed  Onset: 3-4 months ago Quality:  Intensity: Location: Duration: Frequency:  Associated symptoms:  Aura:  Activity: Aggravating factors:  Relieving factors:   Current abortive medications:  Current prophylactic medications:   Past abortive medications:   Past prophylactic medications:   Frequency of medications:  Family history:  Smoker: Alcohol:  Caffeine:  Sleep:   Chronic pain? Denies.   Ambulates with difficulty? Denies ***  Needs a cane*** Needs a walker *** to ambulate for stability.   Recent falls or head injuries? Denies.     Vision changes?  Denies any new issues.  Has a history of*** Any strokelike symptoms? Denies.   Any tremors? Denies. *** Any anosmia? Denies.   Any incontinence of urine? Denies.   Any bowel dysfunction? Denies.      Patient lives with ***   Family history of dementia?   *** with dementia  Does patient drive? No ***  MRI brain brain 04/2023 personally reviewed mild age related volume loss, mild SVD(less than often seen at this age)   Allergies  Allergen Reactions   Penicillins Nausea And  Vomiting and Other (See Comments)    Has patient had a PCN reaction causing immediate rash, facial/tongue/throat swelling, SOB or lightheadedness with hypotension: Y Has patient had a PCN reaction causing severe rash involving mucus membranes or skin necrosis: Y Has patient had a PCN reaction that required hospitalization: N Has patient had a PCN reaction  occurring within the last 10 years: N If all of the above answers are "NO", then may proceed with Cephalosporin use.    Aspirin Other (See Comments)    Wheezing Acetaminophen is OK     Current Outpatient Medications  Medication Instructions   acetaminophen (TYLENOL) 500 mg, 2 times daily   albuterol (PROVENTIL) 2.5 mg, Nebulization, Every 6 hours PRN   albuterol (VENTOLIN HFA) 108 (90 Base) MCG/ACT inhaler USE 2 PUFFS EVERY 6 HOURS AS NEEDED FOR WHEEZING.   amLODipine (NORVASC) 10 mg, Oral, Daily   atorvastatin (LIPITOR) 40 mg, Oral, Daily   atropine 1 % ophthalmic solution 1 drop, 2 times daily PRN   Azelastine-Fluticasone 137-50 MCG/ACT SUSP PLACE ONE SPRAY IN EACH NOSTRIL TWICE DAILY (IN THE MORNING AND AT BEDTIME)   Budeson-Glycopyrrol-Formoterol (BREZTRI AEROSPHERE) 160-9-4.8 MCG/ACT AERO INHALE 2 PUFFS INTO THE LUNGS TWICE DAILY, IN THE MORNING & AT BEDTIME.   carvedilol (COREG) 12.5 mg, Oral, 2 times daily   cetirizine (ZYRTEC) 10 mg, Daily PRN   citalopram (CELEXA) 10 mg, Oral, Daily   cloNIDine (CATAPRES) 0.1 MG tablet TAKE ONE TABLET BY MOUTH TWICE DAILY AS NEEDED for hypertension   clopidogrel (PLAVIX) 75 mg, Oral, Daily   cyanocobalamin (VITAMIN B12) 1,000 mcg, Daily   diclofenac Sodium (VOLTAREN) 4 g, Topical, 2 times daily PRN   doxazosin (CARDURA) 4 mg, Oral, 2 times daily   ferrous sulfate 325 mg, Oral, Daily with breakfast   fluticasone (FLONASE) 50 MCG/ACT nasal spray 2 sprays, As needed   folic acid (FOLVITE) 1 mg, Oral, Daily   furosemide (LASIX) 60 mg, Oral, 2 times daily   hydrALAZINE (APRESOLINE) 100 mg, Oral, 3 times daily   isosorbide mononitrate (IMDUR) 30 mg, Oral, Daily   LORazepam (ATIVAN) 0.5 mg, Oral, 2 times daily PRN, for anxiety   metolazone (ZAROXOLYN) 2.5 mg, Oral, Weekly, Take every Friday   montelukast (SINGULAIR) 10 mg, Oral, Daily   oxyCODONE (OXY IR/ROXICODONE) 5 mg, Oral, Every 6 hours PRN   pantoprazole (PROTONIX) 40 mg, Oral, Daily    potassium chloride (KLOR-CON M) 10 MEQ tablet 10 mEq, Daily   RESTASIS 0.05 % ophthalmic emulsion 1 drop, 2 times daily   sodium bicarbonate 650 MG tablet take ONE tab twice a DAY   Spacer/Aero-Holding Chambers (AEROCHAMBER PLUS) inhaler Use as instructed   Vitamin D (Cholecalciferol) 1,000 Units, Oral, Daily     VITALS:  There were no vitals filed for this visit.    PHYSICAL EXAM   HEENT:  Normocephalic, atraumatic.  The superficial temporal arteries are without ropiness or tenderness. Cardiovascular: Regular rate and rhythm. Lungs: Clear to auscultation bilaterally. Neck: There are no carotid bruits noted bilaterally.  NEUROLOGICAL:     No data to display              No data to display           Orientation:  Alert and oriented to person, place and not to time***. No aphasia or dysarthria. Fund of knowledge is appropriate. Recent and remote memory impaired.  Attention and concentration are reduced***.  Able to name objects and repeat phrases /5 ***. Delayed recall  / ***  Cranial nerves: There is good facial symmetry. Extraocular muscles are intact and visual fields are full to confrontational testing. Speech is fluent and clear. No tongue deviation. Hearing is intact to conversational tone.*** Tone: Tone is good throughout. Sensation: Sensation is intact to light touch.  Vibration is intact at the bilateral big toe.  Coordination: The patient has no difficulty with RAM's or FNF bilaterally. Normal finger to nose  Motor: Strength is 5/5 in the bilateral upper and lower extremities. There is no pronator drift. There are no fasciculations noted. DTR's: Deep tendon reflexes are 2/4 bilaterally. Gait and Station: The patient is able to ambulate without difficulty. Gait is cautious and narrow. Stride length is normal ***      Thank you for allowing [place] the opportunity to participate in the care of this nice patient. Please do not hesitate to contact us for any questions or  concerns.   Total time spent on today's visit was *** minutes dedicated to this patient today, preparing to see patient, examining the patient, ordering tests and/or medications and counseling the patient, documenting clinical information in the EHR or other health record, independently interpreting results and communicating results to the patient/family, discussing treatment and goals, answering patient's questions and coordinating care.  Cc:  Panosh, Neta Mends, MD  Marlowe Kays 06/24/2023 5:56 PM

## 2023-06-27 ENCOUNTER — Ambulatory Visit (INDEPENDENT_AMBULATORY_CARE_PROVIDER_SITE_OTHER): Payer: Medicare Other | Admitting: Physician Assistant

## 2023-06-27 ENCOUNTER — Ambulatory Visit: Payer: Medicare Other

## 2023-06-27 ENCOUNTER — Encounter: Payer: Self-pay | Admitting: Physician Assistant

## 2023-06-27 ENCOUNTER — Other Ambulatory Visit: Payer: Medicare Other

## 2023-06-27 VITALS — BP 204/73 | HR 74 | Resp 20 | Ht 63.0 in | Wt 137.0 lb

## 2023-06-27 DIAGNOSIS — G44209 Tension-type headache, unspecified, not intractable: Secondary | ICD-10-CM

## 2023-06-27 DIAGNOSIS — F0390 Unspecified dementia without behavioral disturbance: Secondary | ICD-10-CM

## 2023-06-27 DIAGNOSIS — R413 Other amnesia: Secondary | ICD-10-CM | POA: Diagnosis not present

## 2023-06-27 NOTE — Patient Instructions (Addendum)
Labs today [address] 6 month   Limit use of pain relievers to no more than 2 days out of the week.  These medications include acetaminophen, NSAIDs (ibuprofen/Advil/Motrin, naproxen/Aleve, triptans (Imitrex/sumatriptan), Excedrin, and narcotics.  This will help reduce risk of rebound headaches. Be aware of common food triggers:  - Caffeine:  coffee, black tea, cola, Mt. Dew  - Chocolate  - Dairy:  aged cheeses (brie, blue, cheddar, gouda, [place], provolone, [place], Swiss, etc), chocolate milk, buttermilk, sour cream, limit eggs and yogurt  - Nuts, peanut butter  - Alcohol  - Cereals/grains:  FRESH breads (fresh bagels, sourdough, doughnuts), yeast productions  - Processed/canned/aged/cured meats (pre-packaged deli meats, hotdogs)  - MSG/glutamate:  soy sauce, flavor enhancer, pickled/preserved/marinated foods  - Sweeteners:  aspartame (Equal, Nutrasweet).  Sugar and Splenda are okay  - Vegetables:  legumes (lima beans, lentils, snow peas, fava beans, pinto peans, peas, garbanzo beans), sauerkraut, onions, olives, pickles  - Fruit:  avocados, bananas, citrus fruit (orange, lemon, grapefruit), mango  - Other:  Frozen meals, macaroni and cheese Routine exercise Stay adequately hydrated (aim for 64 oz water daily) Keep headache diary Maintain proper stress management Maintain proper sleep hygiene Do not skip meals Consider supplements:  magnesium citrate 400mg  daily, riboflavin 400mg  daily, coenzyme Q10 100mg  three times daily.

## 2023-06-28 ENCOUNTER — Telehealth (HOSPITAL_COMMUNITY): Payer: Self-pay

## 2023-06-28 ENCOUNTER — Encounter: Payer: Medicare Other | Admitting: Family Medicine

## 2023-06-28 LAB — TSH: TSH: 2.18 m[IU]/L (ref 0.40–4.50)

## 2023-06-28 LAB — VITAMIN B12: Vitamin B-12: >2000 pg/mL — ABNORMAL HIGH (ref 200–1100)

## 2023-06-28 NOTE — Telephone Encounter (Signed)
Pts grand-daughter reached back out ref a change in time due to meeting I had that conflicted with our appointment time, however she is working late and wont be home until late tonight so no visit is able to be made.  She prefers to be there for her grandmas home visit (memory issues)  Will sch for next week.  She was seen yesterday by neurologist.   Kerry Hough, EMT-Paramedic  [phone] 06/28/2023

## 2023-06-28 NOTE — Progress Notes (Signed)
B12 is more than enough, can take every other day, thyroid levels are normal, thanks

## 2023-06-29 NOTE — Progress Notes (Signed)
Also see what blood pressure is when they call in

## 2023-07-04 ENCOUNTER — Telehealth (HOSPITAL_COMMUNITY): Payer: Self-pay

## 2023-07-04 NOTE — Telephone Encounter (Signed)
Reached out to pts grand-daughter about appoint today but she is doing virtual classes with her students and unable to sit in on our visit. And she reports that her grandma had went back to take a nap.   I did reach back out later in the afternoon but no response.  Will continue to try to make home visit .  Kerry Hough, EMT-Paramedic  [phone] 07/04/2023

## 2023-07-09 ENCOUNTER — Encounter: Payer: Self-pay | Admitting: Nurse Practitioner

## 2023-07-09 ENCOUNTER — Telehealth (HOSPITAL_COMMUNITY): Payer: Self-pay

## 2023-07-11 ENCOUNTER — Other Ambulatory Visit: Payer: Medicare Other

## 2023-07-13 ENCOUNTER — Other Ambulatory Visit: Payer: Medicare Other

## 2023-07-15 ENCOUNTER — Other Ambulatory Visit: Payer: Self-pay | Admitting: Nurse Practitioner

## 2023-07-15 DIAGNOSIS — D638 Anemia in other chronic diseases classified elsewhere: Secondary | ICD-10-CM

## 2023-07-15 NOTE — Progress Notes (Unsigned)
 Patient Care Team: Panosh, Neta Mends, MD as PCP - General (Internal Medicine) Nahser, Deloris Ping, MD as PCP - Cardiology (Cardiology) August Saucer, Corrie Mckusick, MD (Orthopedic Surgery) Malachy Mood, MD as Consulting Physician (Hematology) Bufford Buttner, MD as Consulting Physician (Nephrology) Mateo Flow, MD as Consulting Physician (Ophthalmology) Eber Jones, MD as Referring Physician (Ophthalmology) Delton Coombes Les Pou, MD as Consulting Physician (Pulmonary Disease) Sherrill Raring, [facility] (Pharmacist) Dante Gang, Md, MD (Cardiology) Dorthula Nettles, DO as Consulting Physician (Cardiology)  Clinic Day:  07/15/2023  Referring physician: [name], MD  ASSESSMENT & PLAN:   Assessment & Plan: Anemia of chronic disease -Her anemia is likely secondary to CKD  -she was started on Aranesp q2weeks injections on 03/24/21. She understood risks vs benefits of this medication, including increased risk of thrombosis.  -hospitalized for pneumonia in 10/2022, anemia got worse and aranesp dosing was increased.  Most recently, Aranesp held due to normal hemoglobin level.  -06/19/2023, hemoglobin and hematocrit showed necessary for Aranesp injection.    The patient understands the plans discussed today and is in agreement with them.  She knows to contact our office if she develops concerns prior to her next appointment.  I provided *** minutes of face-to-face time during this encounter and > 50% was spent counseling as documented under my assessment and plan.    Carlean Jews, NP  [facility] - A DEPT OF [facility] [address] [place] [address] Dept: [phone] Dept Fax: [phone]   No orders of the defined types were placed in this encounter.     CHIEF COMPLAINT:  CC: Anemia of chronic disease  Current Treatment: Aranesp injections for hemoglobin level <11  INTERVAL HISTORY:  Taralee is here today for repeat  clinical assessment.  She was last seen by myself on 03/27/2023.  Most recent labs done on 06/19/2023, with hemoglobin 8.5, hematocrit 27.7.  She denies fevers or chills. She denies pain. Her appetite is good. Her weight {Weight change:10426}.  I have reviewed the past medical history, past surgical history, social history and family history with the patient and they are unchanged from previous note.  ALLERGIES:  is allergic to penicillins, aspirin, and other.  MEDICATIONS:  Current Outpatient Medications  Medication Sig Dispense Refill   acetaminophen (TYLENOL) 500 MG tablet Take 500 mg by mouth in the morning and at bedtime.     albuterol (PROVENTIL) (2.5 MG/3ML) 0.083% nebulizer solution Take 3 mLs (2.5 mg total) by nebulization every 6 (six) hours as needed for shortness of breath or wheezing. 360 mL 6   albuterol (VENTOLIN HFA) 108 (90 Base) MCG/ACT inhaler USE 2 PUFFS EVERY 6 HOURS AS NEEDED FOR WHEEZING. 8.5 g 3   amLODipine (NORVASC) 10 MG tablet Take 1 tablet (10 mg total) by mouth daily. (Patient taking differently: Take 10 mg by mouth at bedtime.) 30 tablet 1   atorvastatin (LIPITOR) 40 MG tablet Take 1 tablet (40 mg total) by mouth daily. (Patient taking differently: Take 40 mg by mouth every evening.) 90 tablet 3   atropine 1 % ophthalmic solution Place 1 drop into both eyes 2 (two) times daily as needed.     Azelastine-Fluticasone 137-50 MCG/ACT SUSP PLACE ONE SPRAY IN EACH NOSTRIL TWICE DAILY (IN THE MORNING AND AT BEDTIME) (Patient taking differently: Place 1 spray into both nostrils in the morning and at bedtime.) 23 g 6   Budeson-Glycopyrrol-Formoterol (BREZTRI AEROSPHERE) 160-9-4.8 MCG/ACT AERO INHALE 2  PUFFS INTO THE LUNGS TWICE DAILY, IN THE MORNING & AT BEDTIME. 10.7 g 11   carvedilol (COREG) 12.5 MG tablet Take 1 tablet (12.5 mg total) by mouth 2 (two) times daily. 180 tablet 3   cetirizine (ZYRTEC) 10 MG tablet Take 10 mg by mouth daily as needed for allergies or rhinitis.      citalopram (CELEXA) 20 MG tablet Take 0.5 tablets (10 mg total) by mouth daily. 45 tablet 1   cloNIDine (CATAPRES) 0.1 MG tablet TAKE ONE TABLET BY MOUTH TWICE DAILY AS NEEDED for hypertension 180 tablet 1   clopidogrel (PLAVIX) 75 MG tablet Take 1 tablet (75 mg total) by mouth daily. 30 tablet 1   diclofenac Sodium (VOLTAREN) 1 % GEL Apply 4 g topically 2 (two) times daily as needed (painful areas). 50 g 1   doxazosin (CARDURA) 4 MG tablet Take 1 tablet (4 mg total) by mouth 2 (two) times daily. 60 tablet 6   ferrous sulfate 325 (65 FE) MG EC tablet Take 1 tablet (325 mg total) by mouth daily with breakfast. (Patient taking differently: Take 325 mg by mouth.) 30 tablet 3   fluticasone (FLONASE) 50 MCG/ACT nasal spray Place 2 sprays into both nostrils as needed for allergies or rhinitis.     folic acid (FOLVITE) 1 MG tablet Take 1 tablet (1 mg total) by mouth daily. 30 tablet 0   furosemide (LASIX) 40 MG tablet Take 1.5 tablets (60 mg total) by mouth 2 (two) times daily. 270 tablet 3   hydrALAZINE (APRESOLINE) 100 MG tablet Take 1 tablet (100 mg total) by mouth 3 (three) times daily. (Patient taking differently: Take 100 mg by mouth 3 (three) times daily.) 270 tablet 2   isosorbide mononitrate (IMDUR) 30 MG 24 hr tablet Take 1 tablet (30 mg total) by mouth daily. 90 tablet 3   LORazepam (ATIVAN) 0.5 MG tablet Take 1 tablet (0.5 mg total) by mouth 2 (two) times daily as needed. for anxiety 30 tablet 0   metolazone (ZAROXOLYN) 2.5 MG tablet Take 1 tablet (2.5 mg total) by mouth once a week. Take every Friday 15 tablet 3   montelukast (SINGULAIR) 10 MG tablet Take 1 tablet (10 mg total) by mouth daily. (Patient taking differently: Take 10 mg by mouth at bedtime.) 90 tablet 3   oxyCODONE (OXY IR/ROXICODONE) 5 MG immediate release tablet Take 1 tablet (5 mg total) by mouth every 6 (six) hours as needed for severe pain (pain score 7-10). 20 tablet 0   pantoprazole (PROTONIX) 40 MG tablet Take 1 tablet  (40 mg total) by mouth daily. (Patient taking differently: Take 40 mg by mouth 2 (two) times daily.) 90 tablet 3   potassium chloride (KLOR-CON M) 10 MEQ tablet Take 10 mEq by mouth daily. Take an extra 20 meq (two tablets) on Fridays (this is the day you take Metolazone)     RESTASIS 0.05 % ophthalmic emulsion Place 1 drop into both eyes 2 (two) times daily.     sodium bicarbonate 650 MG tablet take ONE tab twice a DAY (Patient taking differently: Take 650 mg by mouth 2 (two) times daily.) 60 tablet 0   Spacer/Aero-Holding Chambers (AEROCHAMBER PLUS) inhaler Use as instructed (Patient taking differently: Use as instructed as needed) 1 each 2   vitamin B-12 (CYANOCOBALAMIN) 1000 MCG tablet Take 1,000 mcg by mouth daily.     Vitamin D, Cholecalciferol, 25 MCG (1000 UT) TABS Take 1,000 Units by mouth daily. 60 tablet 2   No current facility-administered  medications for this visit.     REVIEW OF SYSTEMS:   Constitutional: Denies fevers, chills or abnormal weight loss Eyes: Denies blurriness of vision Ears, nose, mouth, throat, and face: Denies mucositis or sore throat Respiratory: Denies cough, dyspnea or wheezes Cardiovascular: Denies palpitation, chest discomfort or lower extremity swelling Gastrointestinal:  Denies nausea, heartburn or change in bowel habits Skin: Denies abnormal skin rashes Lymphatics: Denies new lymphadenopathy or easy bruising Neurological:Denies numbness, tingling or new weaknesses Behavioral/Psych: Mood is stable, no new changes  All other systems were reviewed with the patient and are negative.   VITALS:  There were no vitals taken for this visit.  Wt Readings from Last 3 Encounters:  06/27/23 137 lb (62.1 kg)  06/07/23 150 lb (68 kg)  06/06/23 152 lb (68.9 kg)    There is no height or weight on file to calculate BMI.  Performance status (ECOG): {CHL ONC [id number]  PHYSICAL EXAM:   GENERAL:alert, no distress and comfortable SKIN: skin color,  texture, turgor are normal, no rashes or significant lesions EYES: normal, Conjunctiva are pink and non-injected, sclera clear OROPHARYNX:no exudate, no erythema and lips, buccal mucosa, and tongue normal  NECK: supple, thyroid normal size, non-tender, without nodularity LYMPH:  no palpable lymphadenopathy in the cervical, axillary or inguinal LUNGS: clear to auscultation and percussion with normal breathing effort HEART: regular rate & rhythm and no murmurs and no lower extremity edema ABDOMEN:abdomen soft, non-tender and normal bowel sounds Musculoskeletal:no cyanosis of digits and no clubbing  NEURO: alert & oriented x 3 with fluent speech, no focal motor/sensory deficits  LABORATORY DATA:  I have reviewed the data as listed    Component Value Date/Time   NA 143 05/03/2023 1459   NA 142 09/13/2022 0000   NA 136 06/19/2016 1301   K 4.5 05/03/2023 1459   K 3.8 06/19/2016 1301   CL 109 05/03/2023 1459   CO2 22 05/03/2023 1459   CO2 31 (H) 06/19/2016 1301   GLUCOSE 108 (H) 05/03/2023 1459   GLUCOSE 105 06/19/2016 1301   BUN 38 (H) 05/03/2023 1459   BUN 56 (A) 09/13/2022 0000   BUN 26.6 (H) 06/19/2016 1301   CREATININE 3.89 (H) 05/03/2023 1459   CREATININE 2.31 (H) 03/10/2020 1149   CREATININE 1.4 (H) 06/19/2016 1301   CALCIUM 9.1 05/03/2023 1459   CALCIUM 9.8 06/19/2016 1301   PROT 6.7 03/13/2023 1232   PROT 6.6 06/19/2016 1301   ALBUMIN 3.3 (L) 03/13/2023 1232   ALBUMIN 3.5 06/19/2016 1301   AST 22 03/13/2023 1232   AST 22 06/19/2016 1301   ALT 12 03/13/2023 1232   ALT 14 06/19/2016 1301   ALKPHOS 55 03/13/2023 1232   ALKPHOS 37 (L) 06/19/2016 1301   BILITOT 0.6 03/13/2023 1232   BILITOT 0.32 06/19/2016 1301   GFRNONAA 11 (L) 05/03/2023 1459   GFRNONAA 19 (L) 03/10/2020 1149   GFRAA 19 11/04/2020 0000   GFRAA 22 (L) 03/10/2020 1149     Lab Results  Component Value Date   WBC 3.4 (L) 06/19/2023   NEUTROABS 2.0 06/19/2023   HGB 8.5 (L) 06/19/2023   HCT 27.7 (L)  06/19/2023   MCV 95.2 06/19/2023   PLT 265 06/19/2023

## 2023-07-15 NOTE — Assessment & Plan Note (Signed)
-  Her anemia is likely secondary to CKD  -she was started on Aranesp q2weeks injections on 03/24/21. She understood risks vs benefits of this medication, including increased risk of thrombosis.  -hospitalized for pneumonia in 10/2022, anemia got worse and aranesp dosing was increased.  Most recently, Aranesp held due to normal hemoglobin level.  -Her anemia overall well managed, will continue Aranesp as needed

## 2023-07-16 ENCOUNTER — Telehealth (HOSPITAL_COMMUNITY): Payer: Self-pay

## 2023-07-16 NOTE — Telephone Encounter (Signed)
 Reached out to grand-daughter ref home visit with patient but no response.  (She prefers text communication)  Kerry Hough, EMT-Paramedic  [phone] 07/09/2023

## 2023-07-16 NOTE — Progress Notes (Unsigned)
   07/16/2023  Patient ID: [name], female   DOB: 02-26-1934, 89 y.o.   MRN: [id number]  Attempted to contact patient for scheduled appointment for medication management. Left HIPAA compliant message for patient to return my call at their convenience.   Sherrill Raring, PharmD Clinical Pharmacist [phone]

## 2023-07-17 ENCOUNTER — Inpatient Hospital Stay: Payer: Medicare Other

## 2023-07-17 ENCOUNTER — Inpatient Hospital Stay: Payer: Medicare Other | Attending: Hematology | Admitting: Hematology

## 2023-07-17 VITALS — BP 140/62 | HR 86 | Temp 97.8°F | Resp 19 | Ht 63.0 in | Wt 132.2 lb

## 2023-07-17 DIAGNOSIS — F419 Anxiety disorder, unspecified: Secondary | ICD-10-CM | POA: Insufficient documentation

## 2023-07-17 DIAGNOSIS — N183 Chronic kidney disease, stage 3 unspecified: Secondary | ICD-10-CM

## 2023-07-17 DIAGNOSIS — Z86718 Personal history of other venous thrombosis and embolism: Secondary | ICD-10-CM | POA: Insufficient documentation

## 2023-07-17 DIAGNOSIS — N184 Chronic kidney disease, stage 4 (severe): Secondary | ICD-10-CM | POA: Diagnosis not present

## 2023-07-17 DIAGNOSIS — E785 Hyperlipidemia, unspecified: Secondary | ICD-10-CM | POA: Diagnosis not present

## 2023-07-17 DIAGNOSIS — D638 Anemia in other chronic diseases classified elsewhere: Secondary | ICD-10-CM

## 2023-07-17 DIAGNOSIS — F32A Depression, unspecified: Secondary | ICD-10-CM | POA: Insufficient documentation

## 2023-07-17 DIAGNOSIS — J4489 Other specified chronic obstructive pulmonary disease: Secondary | ICD-10-CM | POA: Diagnosis not present

## 2023-07-17 DIAGNOSIS — R519 Headache, unspecified: Secondary | ICD-10-CM | POA: Diagnosis not present

## 2023-07-17 DIAGNOSIS — Z79899 Other long term (current) drug therapy: Secondary | ICD-10-CM | POA: Diagnosis not present

## 2023-07-17 DIAGNOSIS — Z7902 Long term (current) use of antithrombotics/antiplatelets: Secondary | ICD-10-CM | POA: Insufficient documentation

## 2023-07-17 DIAGNOSIS — D631 Anemia in chronic kidney disease: Secondary | ICD-10-CM | POA: Diagnosis not present

## 2023-07-17 DIAGNOSIS — Z90710 Acquired absence of both cervix and uterus: Secondary | ICD-10-CM | POA: Insufficient documentation

## 2023-07-17 DIAGNOSIS — K219 Gastro-esophageal reflux disease without esophagitis: Secondary | ICD-10-CM | POA: Insufficient documentation

## 2023-07-17 DIAGNOSIS — Z880 Allergy status to penicillin: Secondary | ICD-10-CM | POA: Insufficient documentation

## 2023-07-17 DIAGNOSIS — Z886 Allergy status to analgesic agent status: Secondary | ICD-10-CM | POA: Diagnosis not present

## 2023-07-17 DIAGNOSIS — I129 Hypertensive chronic kidney disease with stage 1 through stage 4 chronic kidney disease, or unspecified chronic kidney disease: Secondary | ICD-10-CM | POA: Diagnosis not present

## 2023-07-17 LAB — IRON AND IRON BINDING CAPACITY (CC-WL,HP ONLY)
Iron: 47 ug/dL (ref 28–170)
Saturation Ratios: 29 % (ref 10.4–31.8)
TIBC: 165 ug/dL — ABNORMAL LOW (ref 250–450)
UIBC: 118 ug/dL — ABNORMAL LOW (ref 148–442)

## 2023-07-17 LAB — CBC WITH DIFFERENTIAL (CANCER CENTER ONLY)
Abs Immature Granulocytes: 0.01 10*3/uL (ref 0.00–0.07)
Basophils Absolute: 0 10*3/uL (ref 0.0–0.1)
Basophils Relative: 1 %
Eosinophils Absolute: 0.1 10*3/uL (ref 0.0–0.5)
Eosinophils Relative: 2 %
HCT: 35.2 % — ABNORMAL LOW (ref 36.0–46.0)
Hemoglobin: 11.1 g/dL — ABNORMAL LOW (ref 12.0–15.0)
Immature Granulocytes: 0 %
Lymphocytes Relative: 28 %
Lymphs Abs: 0.9 10*3/uL (ref 0.7–4.0)
MCH: 29.8 pg (ref 26.0–34.0)
MCHC: 31.5 g/dL (ref 30.0–36.0)
MCV: 94.4 fL (ref 80.0–100.0)
Monocytes Absolute: 0.2 10*3/uL (ref 0.1–1.0)
Monocytes Relative: 7 %
Neutro Abs: 2 10*3/uL (ref 1.7–7.7)
Neutrophils Relative %: 62 %
Platelet Count: 213 10*3/uL (ref 150–400)
RBC: 3.73 MIL/uL — ABNORMAL LOW (ref 3.87–5.11)
RDW: 15.7 % — ABNORMAL HIGH (ref 11.5–15.5)
WBC Count: 3.2 10*3/uL — ABNORMAL LOW (ref 4.0–10.5)
nRBC: 0 % (ref 0.0–0.2)

## 2023-07-17 LAB — SAMPLE TO BLOOD BANK

## 2023-07-17 LAB — FERRITIN
Ferritin: 337 ng/mL — ABNORMAL HIGH (ref 11–307)
Ferritin: 351 ng/mL — ABNORMAL HIGH (ref 11–307)

## 2023-07-19 ENCOUNTER — Encounter: Payer: Self-pay | Admitting: Hematology

## 2023-07-23 ENCOUNTER — Telehealth (HOSPITAL_COMMUNITY): Payer: Self-pay

## 2023-07-23 NOTE — Telephone Encounter (Signed)
 Reached out to pts grand-daughter again via text as she works in the school system and will respond back when she can.  No immediate response. Will sch appoint once she responds.   Kerry Hough, EMT-Paramedic  [phone] 07/16/2023

## 2023-07-23 NOTE — Telephone Encounter (Signed)
 Reached out to grand-daughter again about making appointment for this week via text per her request.  No response yet.   Kerry Hough, EMT-Paramedic  [phone] 07/23/2023

## 2023-07-26 ENCOUNTER — Encounter (HOSPITAL_COMMUNITY): Payer: Self-pay | Admitting: Adult Health

## 2023-07-26 ENCOUNTER — Telehealth (HOSPITAL_COMMUNITY): Payer: Self-pay

## 2023-07-26 NOTE — Progress Notes (Signed)
  HF [employer] [facility] Discharge  Discussed with Kerry Hough HF Paramedic  Discussed with HF Paramedic and HF Team.   Goals achieved.Can refer again if new needs identified.   Stable for discharge. Iva Posten NP-C

## 2023-07-26 NOTE — Telephone Encounter (Signed)
 Patient is now discharged from [employer] [facility] [facility].  Patient has/has not met the following goals:  Yes :Patient expresses basic understanding of medications and what they are for Yes :Patient able to verbalize heart failure specific dietary/fluid restrictions Yes :Patient is aware of who to call if they have medical concerns or if they need to schedule or change appts Yes :Patient has a scale for daily weights and weighs regularly Yes :Patient able to verbalize concerning symptoms when they should call the HF clinic (weight gain ranges, etc) Yes :Patient has a PCP and has seen within the past year or has upcoming appt Yes :Patient has reliable access to getting their medications Yes :Patient has shown they are able to reorder medications reliably No :Patient has had admission in past 30 days- if yes how many? No :Patient has had admission in past 90 days- if yes how many?  Discharge Comments: [name] is POA and is the main contact for scheduling visits. Several attempts have went out the past few wks to sch home visit but no response. At last visit the grand-daughter has done well managing her meds, ordering refills, getting her to appointments and managing all the things.  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   Kerry Hough, EMT-Paramedic  [phone] 07/26/2023

## 2023-08-07 ENCOUNTER — Inpatient Hospital Stay (HOSPITAL_COMMUNITY)
Admission: EM | Admit: 2023-08-07 | Discharge: 2023-08-13 | DRG: 682 | Disposition: A | Discharge status: Home / Self Care | Attending: Internal Medicine | Admitting: Internal Medicine

## 2023-08-07 ENCOUNTER — Encounter: Payer: Self-pay | Admitting: Internal Medicine

## 2023-08-07 ENCOUNTER — Ambulatory Visit (INDEPENDENT_AMBULATORY_CARE_PROVIDER_SITE_OTHER): Payer: Medicare Other | Admitting: Internal Medicine

## 2023-08-07 VITALS — BP 170/78 | HR 79 | Temp 98.0°F | Ht 63.0 in | Wt 136.0 lb

## 2023-08-07 DIAGNOSIS — R54 Age-related physical debility: Secondary | ICD-10-CM | POA: Diagnosis present

## 2023-08-07 DIAGNOSIS — I160 Hypertensive urgency: Secondary | ICD-10-CM | POA: Diagnosis present

## 2023-08-07 DIAGNOSIS — N185 Chronic kidney disease, stage 5: Secondary | ICD-10-CM | POA: Diagnosis not present

## 2023-08-07 DIAGNOSIS — I132 Hypertensive heart and chronic kidney disease with heart failure and with stage 5 chronic kidney disease, or end stage renal disease: Secondary | ICD-10-CM | POA: Diagnosis present

## 2023-08-07 DIAGNOSIS — I5043 Acute on chronic combined systolic (congestive) and diastolic (congestive) heart failure: Secondary | ICD-10-CM | POA: Diagnosis not present

## 2023-08-07 DIAGNOSIS — N184 Chronic kidney disease, stage 4 (severe): Secondary | ICD-10-CM | POA: Diagnosis not present

## 2023-08-07 DIAGNOSIS — Z7902 Long term (current) use of antithrombotics/antiplatelets: Secondary | ICD-10-CM | POA: Diagnosis not present

## 2023-08-07 DIAGNOSIS — I1A0 Resistant hypertension: Secondary | ICD-10-CM

## 2023-08-07 DIAGNOSIS — J4541 Moderate persistent asthma with (acute) exacerbation: Secondary | ICD-10-CM | POA: Diagnosis not present

## 2023-08-07 DIAGNOSIS — R519 Headache, unspecified: Secondary | ICD-10-CM | POA: Diagnosis not present

## 2023-08-07 DIAGNOSIS — N179 Acute kidney failure, unspecified: Secondary | ICD-10-CM | POA: Diagnosis not present

## 2023-08-07 DIAGNOSIS — I130 Hypertensive heart and chronic kidney disease with heart failure and stage 1 through stage 4 chronic kidney disease, or unspecified chronic kidney disease: Secondary | ICD-10-CM | POA: Diagnosis not present

## 2023-08-07 DIAGNOSIS — R918 Other nonspecific abnormal finding of lung field: Secondary | ICD-10-CM | POA: Diagnosis not present

## 2023-08-07 DIAGNOSIS — J45901 Unspecified asthma with (acute) exacerbation: Secondary | ICD-10-CM

## 2023-08-07 DIAGNOSIS — E785 Hyperlipidemia, unspecified: Secondary | ICD-10-CM | POA: Diagnosis not present

## 2023-08-07 DIAGNOSIS — Z833 Family history of diabetes mellitus: Secondary | ICD-10-CM | POA: Diagnosis not present

## 2023-08-07 DIAGNOSIS — I129 Hypertensive chronic kidney disease with stage 1 through stage 4 chronic kidney disease, or unspecified chronic kidney disease: Secondary | ICD-10-CM

## 2023-08-07 DIAGNOSIS — E872 Acidosis, unspecified: Secondary | ICD-10-CM

## 2023-08-07 DIAGNOSIS — I10 Essential (primary) hypertension: Secondary | ICD-10-CM

## 2023-08-07 DIAGNOSIS — Z515 Encounter for palliative care: Secondary | ICD-10-CM

## 2023-08-07 DIAGNOSIS — I959 Hypotension, unspecified: Secondary | ICD-10-CM | POA: Diagnosis not present

## 2023-08-07 DIAGNOSIS — I5033 Acute on chronic diastolic (congestive) heart failure: Secondary | ICD-10-CM

## 2023-08-07 DIAGNOSIS — J90 Pleural effusion, not elsewhere classified: Secondary | ICD-10-CM | POA: Diagnosis not present

## 2023-08-07 DIAGNOSIS — M79621 Pain in right upper arm: Secondary | ICD-10-CM

## 2023-08-07 DIAGNOSIS — D631 Anemia in chronic kidney disease: Secondary | ICD-10-CM | POA: Diagnosis not present

## 2023-08-07 DIAGNOSIS — Z91148 Patient's other noncompliance with medication regimen for other reason: Secondary | ICD-10-CM | POA: Diagnosis not present

## 2023-08-07 DIAGNOSIS — Z79899 Other long term (current) drug therapy: Secondary | ICD-10-CM | POA: Diagnosis not present

## 2023-08-07 DIAGNOSIS — I161 Hypertensive emergency: Secondary | ICD-10-CM | POA: Diagnosis not present

## 2023-08-07 DIAGNOSIS — Z8249 Family history of ischemic heart disease and other diseases of the circulatory system: Secondary | ICD-10-CM | POA: Diagnosis not present

## 2023-08-07 DIAGNOSIS — R059 Cough, unspecified: Secondary | ICD-10-CM | POA: Diagnosis not present

## 2023-08-07 DIAGNOSIS — T502X6A Underdosing of carbonic-anhydrase inhibitors, benzothiadiazides and other diuretics, initial encounter: Secondary | ICD-10-CM | POA: Diagnosis not present

## 2023-08-07 DIAGNOSIS — J811 Chronic pulmonary edema: Secondary | ICD-10-CM | POA: Diagnosis not present

## 2023-08-07 DIAGNOSIS — Z91013 Allergy to seafood: Secondary | ICD-10-CM

## 2023-08-07 DIAGNOSIS — Z880 Allergy status to penicillin: Secondary | ICD-10-CM

## 2023-08-07 DIAGNOSIS — R0989 Other specified symptoms and signs involving the circulatory and respiratory systems: Secondary | ICD-10-CM | POA: Diagnosis not present

## 2023-08-07 DIAGNOSIS — I5032 Chronic diastolic (congestive) heart failure: Secondary | ICD-10-CM

## 2023-08-07 DIAGNOSIS — K219 Gastro-esophageal reflux disease without esophagitis: Secondary | ICD-10-CM | POA: Diagnosis present

## 2023-08-07 DIAGNOSIS — M19011 Primary osteoarthritis, right shoulder: Secondary | ICD-10-CM | POA: Diagnosis not present

## 2023-08-07 DIAGNOSIS — E1122 Type 2 diabetes mellitus with diabetic chronic kidney disease: Secondary | ICD-10-CM | POA: Diagnosis not present

## 2023-08-07 DIAGNOSIS — J40 Bronchitis, not specified as acute or chronic: Secondary | ICD-10-CM

## 2023-08-07 DIAGNOSIS — N170 Acute kidney failure with tubular necrosis: Secondary | ICD-10-CM | POA: Diagnosis not present

## 2023-08-07 DIAGNOSIS — D649 Anemia, unspecified: Secondary | ICD-10-CM | POA: Diagnosis not present

## 2023-08-07 DIAGNOSIS — N189 Chronic kidney disease, unspecified: Secondary | ICD-10-CM | POA: Diagnosis not present

## 2023-08-07 DIAGNOSIS — F419 Anxiety disorder, unspecified: Secondary | ICD-10-CM | POA: Diagnosis present

## 2023-08-07 DIAGNOSIS — J45909 Unspecified asthma, uncomplicated: Secondary | ICD-10-CM | POA: Diagnosis not present

## 2023-08-07 DIAGNOSIS — Z7189 Other specified counseling: Secondary | ICD-10-CM | POA: Diagnosis not present

## 2023-08-07 DIAGNOSIS — I700 Atherosclerosis of aorta: Secondary | ICD-10-CM | POA: Diagnosis not present

## 2023-08-07 DIAGNOSIS — M19021 Primary osteoarthritis, right elbow: Secondary | ICD-10-CM | POA: Diagnosis not present

## 2023-08-07 LAB — POC COVID19 BINAXNOW: SARS Coronavirus 2 Ag: NEGATIVE

## 2023-08-07 MED ORDER — FOLIC ACID 1 MG PO TABS: 1.0000 mg | ORAL_TABLET | Freq: Every day | ORAL | 6 refills | Status: DC

## 2023-08-07 MED ORDER — CLOPIDOGREL BISULFATE 75 MG PO TABS: 75.0000 mg | ORAL_TABLET | Freq: Every day | ORAL | 1 refills | Status: DC

## 2023-08-07 MED ORDER — AZITHROMYCIN 250 MG PO TABS: ORAL_TABLET | ORAL | 0 refills | Status: DC

## 2023-08-07 MED ORDER — CLONIDINE HCL 0.1 MG PO TABS: ORAL_TABLET | ORAL | 1 refills | Status: DC

## 2023-08-07 MED ORDER — CITALOPRAM HYDROBROMIDE 20 MG PO TABS: 10.0000 mg | ORAL_TABLET | Freq: Every day | ORAL | 1 refills | Status: AC

## 2023-08-07 NOTE — Patient Instructions (Signed)
 Add antibitoic  for   bronchitis   today.  Keep appt with Dr [name] next week to check kidney function.   Stay on inhalers  breztri   and albuterol and fu with Pulmonary team if not getting better or other  issues with lungs.   Plan fu in 1-2 months   \bp is up today  take the mid day dose of bp medication thoday .

## 2023-08-07 NOTE — Progress Notes (Unsigned)
 Chief Complaint  Patient presents with   Medical Management of Chronic Issues    Pt is here with daughter, Hollie Salk, for 2 months f/u. Pt reports she is still having headache, stomach issues from last visit in January. Pt denied any changes.    Cough    Pt reports coughing started has not been that long. Unable to recall when it started. Pt denied fever, sore throat, body ache.    Medication Refill    HPI: [name] 89 y.o. come in for Chronic disease management .here with family member    But for past 2 weeks  has had cough without fever and now coughing up  no blood   socb some past baseline ., Memory she says is better than family but no acute issues  Nees multiple refills today   Hasn't  taken mid day meds yet .  Has fu appt renal next week  Has seen hema for chronic disease anemai  and fu   ROS: See pertinent positives and negatives per HPI. No bleeding  new pains    Past Medical History:  Diagnosis Date   Acute superficial venous thrombosis of left lower extremity 03/27/2014   concern about hx and potential extensive on exam tenderness calf and low dose lovenox 40 qd 2-4 weekselevetion and close  fu advised .    Allergy    Anemia    Anxiety    Arthritis    "shoulders" (09/09/2012)   Asthma 09/08/2012   Carotid bruit    [place] 2018  low risk 1 - 39%   Cataract    bil cateracts removed   Chronic kidney disease    Clotting disorder (HCC)    blood clots in legs   Diabetes mellitus without complication (HCC)    "Borderline" per pt; being monitored.  no meds per pt   Diverticulosis    Dyspnea 04/27/2014   GERD (gastroesophageal reflux disease)    Headache(784.0)    "related to my high blood pressure" (09/09/2012)   Hiatal hernia    History of DVT (deep vein thrombosis)    "RLE" (09/09/2012) coumadine cant take asa so on plavix    HTN (hypertension) 09/08/2012   Hyperlipidemia    Lupus    "cured years ago" (09/09/2012)   Other dysphagia 02/11/2013   Syncope and  collapse 01/21/2018   Varicose veins    Varicose veins of leg with complications 12/05/2010    Family History  Problem Relation Age of Onset   Hypertension Mother    Lung cancer Mother    Hypertension Father    Lung cancer Father    Breast cancer Sister    Colon cancer Sister        ? 38' s dx - died in 22's   Breast cancer Sister    Hypertension Brother    Rectal cancer Brother    Stomach cancer Brother    Breast cancer Brother    Heart attack Daughter    Esophageal cancer Daughter    Hypertension Daughter    Diabetes Daughter    Breast cancer Paternal Aunt    Lung cancer Other        both parents    Pancreatic cancer Neg Hx     Social History   Socioeconomic History   Marital status: Single    Spouse name: Not on file   Number of children: 6   Years of education: 9   Highest education level: Not on file  Occupational  History   Occupation: retired  Tobacco Use   Smoking status: Never   Smokeless tobacco: Never  Vaping Use   Vaping status: Never Used  Substance and Sexual Activity   Alcohol use: No   Drug use: No   Sexual activity: Not Currently    Birth control/protection: Surgical  Other Topics Concern   Not on file  Social History Narrative   2 people living in the home.  Grand daughter.Up and down through the nightHad 7 children  2 deceased . 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  Neg tad    Left handed   Drinks caffeine prn    Granddaughter lives with her   One floor home   Social Drivers of Health   Financial Resource Strain: Low Risk  (04/14/2022)   Overall Financial Resource Strain (CARDIA)    Difficulty of Paying Living Expenses: Not very hard  Food Insecurity: No Food Insecurity (12/01/2022)   Hunger Vital Sign    Worried About Running Out of Food in the Last Year: Never true    Ran Out of Food in the Last Year: Never  true  Transportation Needs: No Transportation Needs (12/01/2022)   PRAPARE - Administrator, Civil Service (Medical): No    Lack of Transportation (Non-Medical): No  Physical Activity: Not on file  Stress: Not on file  Social Connections: Not on file    No facility-administered medications prior to visit.   Outpatient Medications Prior to Visit  Medication Sig Dispense Refill   acetaminophen (TYLENOL) 500 MG tablet Take 500 mg by mouth 2 (two) times daily as needed for mild pain (pain score 1-3) or headache.     albuterol (PROVENTIL) (2.5 MG/3ML) 0.083% nebulizer solution Take 3 mLs (2.5 mg total) by nebulization every 6 (six) hours as needed for shortness of breath or wheezing. 360 mL 6   albuterol (VENTOLIN HFA) 108 (90 Base) MCG/ACT inhaler USE 2 PUFFS EVERY 6 HOURS AS NEEDED FOR WHEEZING. 8.5 g 3   amLODipine (NORVASC) 10 MG tablet Take 1 tablet (10 mg total) by mouth daily. (Patient taking differently: Take 10 mg by mouth at bedtime.) 30 tablet 1   atorvastatin (LIPITOR) 40 MG tablet Take 1 tablet (40 mg total) by mouth daily. (Patient taking differently: Take 40 mg by mouth every evening.) 90 tablet 3   atropine 1 % ophthalmic solution Place 1 drop into both eyes 2 (two) times daily as needed.     Azelastine-Fluticasone 137-50 MCG/ACT SUSP PLACE ONE SPRAY IN EACH NOSTRIL TWICE DAILY (IN THE MORNING AND AT BEDTIME) (Patient taking differently: Place 1 spray into both nostrils in the morning and at bedtime.) 23 g 6   Budeson-Glycopyrrol-Formoterol (BREZTRI AEROSPHERE) 160-9-4.8 MCG/ACT AERO INHALE 2 PUFFS INTO THE LUNGS TWICE DAILY, IN THE MORNING & AT BEDTIME. 10.7 g 11   carvedilol (COREG) 12.5 MG tablet Take 1 tablet (12.5 mg total) by mouth 2 (two) times daily. 180 tablet 3   cetirizine (ZYRTEC) 10 MG tablet Take 10 mg by mouth daily as needed for allergies or rhinitis.     diclofenac Sodium (VOLTAREN) 1 % GEL Apply 4 g topically 2 (two) times daily as needed (painful  areas). 50 g 1   doxazosin (CARDURA) 4 MG tablet Take 1 tablet (4 mg total) by mouth 2 (two) times daily. 60 tablet 6   ferrous  sulfate 325 (65 FE) MG EC tablet Take 1 tablet (325 mg total) by mouth daily with breakfast. 30 tablet 3   fluticasone (FLONASE) 50 MCG/ACT nasal spray Place 2 sprays into both nostrils as needed for allergies or rhinitis.     furosemide (LASIX) 40 MG tablet Take 1.5 tablets (60 mg total) by mouth 2 (two) times daily. (Patient taking differently: Take 60 mg by mouth 2 (two) times daily. Take 60mg  by mouth in the morning and 60mg  in the afternoon.) 270 tablet 3   hydrALAZINE (APRESOLINE) 100 MG tablet Take 1 tablet (100 mg total) by mouth 3 (three) times daily. 270 tablet 2   LORazepam (ATIVAN) 0.5 MG tablet Take 1 tablet (0.5 mg total) by mouth 2 (two) times daily as needed. for anxiety 30 tablet 0   metolazone (ZAROXOLYN) 2.5 MG tablet Take 1 tablet (2.5 mg total) by mouth once a week. Take every Friday 15 tablet 3   montelukast (SINGULAIR) 10 MG tablet Take 1 tablet (10 mg total) by mouth daily. (Patient taking differently: Take 10 mg by mouth at bedtime.) 90 tablet 3   oxyCODONE (OXY IR/ROXICODONE) 5 MG immediate release tablet Take 1 tablet (5 mg total) by mouth every 6 (six) hours as needed for severe pain (pain score 7-10). 20 tablet 0   pantoprazole (PROTONIX) 40 MG tablet Take 1 tablet (40 mg total) by mouth daily. (Patient taking differently: Take 40 mg by mouth 2 (two) times daily.) 90 tablet 3   potassium chloride (KLOR-CON M) 10 MEQ tablet Take 10 mEq by mouth daily. Take an extra 20 meq (two tablets) on Fridays (this is the day you take Metolazone)     RESTASIS 0.05 % ophthalmic emulsion Place 1 drop into both eyes 2 (two) times daily.     sodium bicarbonate 650 MG tablet take ONE tab twice a DAY (Patient taking differently: Take 650 mg by mouth 2 (two) times daily.) 60 tablet 0   Spacer/Aero-Holding Chambers (AEROCHAMBER PLUS) inhaler Use as instructed (Patient  not taking: Reported on 08/08/2023) 1 each 2   vitamin B-12 (CYANOCOBALAMIN) 1000 MCG tablet Take 1,000 mcg by mouth daily.     Vitamin D, Cholecalciferol, 25 MCG (1000 UT) TABS Take 1,000 Units by mouth daily. 60 tablet 2   citalopram (CELEXA) 20 MG tablet Take 0.5 tablets (10 mg total) by mouth daily. 45 tablet 1   cloNIDine (CATAPRES) 0.1 MG tablet TAKE ONE TABLET BY MOUTH TWICE DAILY AS NEEDED for hypertension 180 tablet 1   clopidogrel (PLAVIX) 75 MG tablet Take 1 tablet (75 mg total) by mouth daily. 30 tablet 1   folic acid (FOLVITE) 1 MG tablet Take 1 tablet (1 mg total) by mouth daily. 30 tablet 0   isosorbide mononitrate (IMDUR) 30 MG 24 hr tablet Take 1 tablet (30 mg total) by mouth daily. 90 tablet 3     EXAM:  BP (!) 170/78 (BP Location: Right Arm, Cuff Size: Normal)   Pulse 79   Temp 98 F (36.7 C) (Oral)   Ht 5\' 3"  (1.6 m)   Wt 136 lb (61.7 kg)   SpO2 94%   BMI 24.09 kg/m   Body mass index is 24.09 kg/m.  GENERAL: vitals reviewed and listed above, alert, oriented,  non toxic  and in no acute distress with ocass wet sounding cough bronchial no dyspnea at rest  HEENT: atraumatic, conjunctiva  clear, no obvious abnormalities on inspection of external nose and ears O NECK: no obvious masses on inspection  palpation  LUNGS: bs = scattered wjheezing no obv rales   decent air movement and no retractions  CV: HRRR, no clubbing cyanosis or  baseline edema MS: moves all extremities without noticeable focal  abnormality PSYCH: pleasant and cooperative,nl speech   Lab Results  Component Value Date   WBC 7.5 08/08/2023   HGB 10.2 (L) 08/08/2023   HCT 32.3 (L) 08/08/2023   PLT 167 08/08/2023   GLUCOSE 100 (H) 08/08/2023   CHOL 190 07/21/2022   TRIG 97 07/21/2022   HDL 92 07/21/2022   LDLCALC 81 07/21/2022   ALT 12 03/13/2023   AST 22 03/13/2023   NA 139 08/08/2023   K 4.1 08/08/2023   CL 110 08/08/2023   CREATININE 5.71 (H) 08/08/2023   BUN 75 (H) 08/08/2023   CO2 16  (L) 08/08/2023   TSH 2.18 06/27/2023   INR 1.0 10/31/2022   HGBA1C 5.3 06/29/2022   MICROALBUR 119 03/02/2021   BP Readings from Last 3 Encounters:  08/08/23 (!) 195/82  08/07/23 (!) 170/78  07/17/23 (!) 140/62    ASSESSMENT AND PLAN:  Discussed the following assessment and plan:  Wheezy bronchitis - underlying asthma hx under pulm care   poss infection flare vs other  Cough, unspecified type - Plan: POC COVID-19  Hypertensive heart disease with congestive heart failure and chronic kidney disease, unspecified CKD stage, unspecified heart failure type (HCC)  Resistant hypertension  Chronic kidney disease (CKD), stage IV (severe) (HCC)  Essential hypertension  Medication management  Headache, unspecified headache type To take mid day hydralazine  to help with bp hopefully   Refilled requeseted meds  although   not convinced that all managed by me  Close fu lung status but pulse ox ok today  concern about secondary infection etc  Samples  x 3 of breztri  Resistant hard to control ht    Fu memory concerns     pharmacy has  reached out to her as planned since last visit  Multi system decline  but today resp is main issue  -Patient advised to return or notify health care team  if  new concerns arise.  Patient Instructions  Add antibitoic  for   bronchitis   today.  Keep appt with Dr [name] next week to check kidney function.   Stay on inhalers  breztri   and albuterol and fu with Pulmonary team if not getting better or other  issues with lungs.   Plan fu in 1-2 months   \bp is up today  take the mid day dose of bp medication thoday .      Neta Mends. Matea Stanard M.D.

## 2023-08-07 NOTE — ED Triage Notes (Signed)
 Pt in with sob that began today, hx of asthma. Pt also reporting R shoulder and R humerus pain, denies any injury.

## 2023-08-08 ENCOUNTER — Emergency Department (HOSPITAL_COMMUNITY)

## 2023-08-08 ENCOUNTER — Other Ambulatory Visit: Payer: Self-pay

## 2023-08-08 ENCOUNTER — Encounter: Payer: Self-pay | Admitting: Internal Medicine

## 2023-08-08 ENCOUNTER — Encounter (HOSPITAL_COMMUNITY): Payer: Self-pay | Admitting: Emergency Medicine

## 2023-08-08 ENCOUNTER — Observation Stay (HOSPITAL_COMMUNITY)

## 2023-08-08 DIAGNOSIS — Z8249 Family history of ischemic heart disease and other diseases of the circulatory system: Secondary | ICD-10-CM | POA: Diagnosis not present

## 2023-08-08 DIAGNOSIS — I132 Hypertensive heart and chronic kidney disease with heart failure and with stage 5 chronic kidney disease, or end stage renal disease: Secondary | ICD-10-CM | POA: Diagnosis not present

## 2023-08-08 DIAGNOSIS — E872 Acidosis, unspecified: Secondary | ICD-10-CM

## 2023-08-08 DIAGNOSIS — E785 Hyperlipidemia, unspecified: Secondary | ICD-10-CM | POA: Diagnosis present

## 2023-08-08 DIAGNOSIS — M79621 Pain in right upper arm: Secondary | ICD-10-CM | POA: Diagnosis not present

## 2023-08-08 DIAGNOSIS — R54 Age-related physical debility: Secondary | ICD-10-CM | POA: Diagnosis present

## 2023-08-08 DIAGNOSIS — Z91013 Allergy to seafood: Secondary | ICD-10-CM | POA: Diagnosis not present

## 2023-08-08 DIAGNOSIS — N179 Acute kidney failure, unspecified: Secondary | ICD-10-CM | POA: Diagnosis not present

## 2023-08-08 DIAGNOSIS — R918 Other nonspecific abnormal finding of lung field: Secondary | ICD-10-CM | POA: Diagnosis not present

## 2023-08-08 DIAGNOSIS — I517 Cardiomegaly: Secondary | ICD-10-CM | POA: Diagnosis not present

## 2023-08-08 DIAGNOSIS — J811 Chronic pulmonary edema: Secondary | ICD-10-CM | POA: Diagnosis not present

## 2023-08-08 DIAGNOSIS — Z91148 Patient's other noncompliance with medication regimen for other reason: Secondary | ICD-10-CM | POA: Diagnosis not present

## 2023-08-08 DIAGNOSIS — I161 Hypertensive emergency: Secondary | ICD-10-CM | POA: Diagnosis present

## 2023-08-08 DIAGNOSIS — Z833 Family history of diabetes mellitus: Secondary | ICD-10-CM | POA: Diagnosis not present

## 2023-08-08 DIAGNOSIS — I5033 Acute on chronic diastolic (congestive) heart failure: Secondary | ICD-10-CM

## 2023-08-08 DIAGNOSIS — I700 Atherosclerosis of aorta: Secondary | ICD-10-CM | POA: Diagnosis not present

## 2023-08-08 DIAGNOSIS — R0602 Shortness of breath: Secondary | ICD-10-CM | POA: Diagnosis not present

## 2023-08-08 DIAGNOSIS — D631 Anemia in chronic kidney disease: Secondary | ICD-10-CM | POA: Diagnosis not present

## 2023-08-08 DIAGNOSIS — J4541 Moderate persistent asthma with (acute) exacerbation: Secondary | ICD-10-CM

## 2023-08-08 DIAGNOSIS — I5032 Chronic diastolic (congestive) heart failure: Secondary | ICD-10-CM

## 2023-08-08 DIAGNOSIS — N185 Chronic kidney disease, stage 5: Secondary | ICD-10-CM | POA: Diagnosis not present

## 2023-08-08 DIAGNOSIS — I959 Hypotension, unspecified: Secondary | ICD-10-CM | POA: Diagnosis not present

## 2023-08-08 DIAGNOSIS — J45909 Unspecified asthma, uncomplicated: Secondary | ICD-10-CM | POA: Diagnosis not present

## 2023-08-08 DIAGNOSIS — Z79899 Other long term (current) drug therapy: Secondary | ICD-10-CM | POA: Diagnosis not present

## 2023-08-08 DIAGNOSIS — F419 Anxiety disorder, unspecified: Secondary | ICD-10-CM | POA: Diagnosis present

## 2023-08-08 DIAGNOSIS — K219 Gastro-esophageal reflux disease without esophagitis: Secondary | ICD-10-CM | POA: Diagnosis present

## 2023-08-08 DIAGNOSIS — J90 Pleural effusion, not elsewhere classified: Secondary | ICD-10-CM | POA: Diagnosis not present

## 2023-08-08 DIAGNOSIS — Z515 Encounter for palliative care: Secondary | ICD-10-CM | POA: Diagnosis not present

## 2023-08-08 DIAGNOSIS — M19021 Primary osteoarthritis, right elbow: Secondary | ICD-10-CM | POA: Diagnosis not present

## 2023-08-08 DIAGNOSIS — M19011 Primary osteoarthritis, right shoulder: Secondary | ICD-10-CM | POA: Diagnosis not present

## 2023-08-08 DIAGNOSIS — I160 Hypertensive urgency: Secondary | ICD-10-CM | POA: Diagnosis not present

## 2023-08-08 DIAGNOSIS — E1122 Type 2 diabetes mellitus with diabetic chronic kidney disease: Secondary | ICD-10-CM | POA: Diagnosis present

## 2023-08-08 DIAGNOSIS — Z880 Allergy status to penicillin: Secondary | ICD-10-CM | POA: Diagnosis not present

## 2023-08-08 DIAGNOSIS — Z7902 Long term (current) use of antithrombotics/antiplatelets: Secondary | ICD-10-CM | POA: Diagnosis not present

## 2023-08-08 DIAGNOSIS — N170 Acute kidney failure with tubular necrosis: Secondary | ICD-10-CM | POA: Diagnosis present

## 2023-08-08 DIAGNOSIS — R0989 Other specified symptoms and signs involving the circulatory and respiratory systems: Secondary | ICD-10-CM | POA: Diagnosis not present

## 2023-08-08 DIAGNOSIS — T502X6A Underdosing of carbonic-anhydrase inhibitors, benzothiadiazides and other diuretics, initial encounter: Secondary | ICD-10-CM | POA: Diagnosis present

## 2023-08-08 DIAGNOSIS — Z7189 Other specified counseling: Secondary | ICD-10-CM | POA: Diagnosis not present

## 2023-08-08 DIAGNOSIS — I5043 Acute on chronic combined systolic (congestive) and diastolic (congestive) heart failure: Secondary | ICD-10-CM | POA: Diagnosis present

## 2023-08-08 DIAGNOSIS — I129 Hypertensive chronic kidney disease with stage 1 through stage 4 chronic kidney disease, or unspecified chronic kidney disease: Secondary | ICD-10-CM

## 2023-08-08 HISTORY — DX: Moderate persistent asthma with (acute) exacerbation: J45.41

## 2023-08-08 HISTORY — DX: Chronic kidney disease, stage 5: N18.5

## 2023-08-08 LAB — BASIC METABOLIC PANEL
Anion gap: 13 (ref 5–15)
BUN: 75 mg/dL — ABNORMAL HIGH (ref 8–23)
CO2: 16 mmol/L — ABNORMAL LOW (ref 22–32)
Calcium: 8 mg/dL — ABNORMAL LOW (ref 8.9–10.3)
Chloride: 110 mmol/L (ref 98–111)
Creatinine, Ser: 5.71 mg/dL — ABNORMAL HIGH (ref 0.44–1.00)
GFR, Estimated: 7 mL/min — ABNORMAL LOW (ref >60)
Glucose, Bld: 100 mg/dL — ABNORMAL HIGH (ref 70–99)
Potassium: 4.1 mmol/L (ref 3.5–5.1)
Sodium: 139 mmol/L (ref 135–145)

## 2023-08-08 LAB — PROCALCITONIN: Procalcitonin: 0.14 ng/mL

## 2023-08-08 LAB — CBC WITH DIFFERENTIAL/PLATELET
Abs Immature Granulocytes: 0.04 10*3/uL (ref 0.00–0.07)
Basophils Absolute: 0 10*3/uL (ref 0.0–0.1)
Basophils Relative: 0 %
Eosinophils Absolute: 0.1 10*3/uL (ref 0.0–0.5)
Eosinophils Relative: 2 %
HCT: 32.3 % — ABNORMAL LOW (ref 36.0–46.0)
Hemoglobin: 10.2 g/dL — ABNORMAL LOW (ref 12.0–15.0)
Immature Granulocytes: 1 %
Lymphocytes Relative: 15 %
Lymphs Abs: 1.1 10*3/uL (ref 0.7–4.0)
MCH: 29.9 pg (ref 26.0–34.0)
MCHC: 31.6 g/dL (ref 30.0–36.0)
MCV: 94.7 fL (ref 80.0–100.0)
Monocytes Absolute: 0.5 10*3/uL (ref 0.1–1.0)
Monocytes Relative: 6 %
Neutro Abs: 5.7 10*3/uL (ref 1.7–7.7)
Neutrophils Relative %: 76 %
Platelets: 167 10*3/uL (ref 150–400)
RBC: 3.41 MIL/uL — ABNORMAL LOW (ref 3.87–5.11)
RDW: 15.4 % (ref 11.5–15.5)
WBC: 7.5 10*3/uL (ref 4.0–10.5)
nRBC: 0 % (ref 0.0–0.2)

## 2023-08-08 LAB — ECHOCARDIOGRAM COMPLETE
Area-P 1/2: 3.08 cm2
Calc EF: 36.9 %
Height: 63 in
S' Lateral: 3.5 cm
Single Plane A2C EF: 36.5 %
Single Plane A4C EF: 36.2 %
Weight: 2175.99 [oz_av]

## 2023-08-08 MED ORDER — ISOSORBIDE MONONITRATE ER 30 MG PO TB24
30.0000 mg | ORAL_TABLET | Freq: Every day | ORAL | Status: DC
  Administered 2023-08-08: 30 mg via ORAL
  Filled 2023-08-08: qty 1

## 2023-08-08 MED ORDER — FUROSEMIDE 10 MG/ML IJ SOLN
40.0000 mg | Freq: Two times a day (BID) | INTRAMUSCULAR | Status: DC
  Administered 2023-08-08 – 2023-08-10 (×4): 40 mg via INTRAVENOUS
  Filled 2023-08-08 (×4): qty 4

## 2023-08-08 MED ORDER — HEPARIN SODIUM (PORCINE) 5000 UNIT/ML IJ SOLN
5000.0000 [IU] | Freq: Three times a day (TID) | INTRAMUSCULAR | Status: DC
  Administered 2023-08-08 – 2023-08-13 (×15): 5000 [IU] via SUBCUTANEOUS
  Filled 2023-08-08 (×16): qty 1

## 2023-08-08 MED ORDER — CARVEDILOL 6.25 MG PO TABS
6.2500 mg | ORAL_TABLET | Freq: Two times a day (BID) | ORAL | Status: DC
  Administered 2023-08-09 – 2023-08-12 (×8): 6.25 mg via ORAL
  Filled 2023-08-08 (×9): qty 1

## 2023-08-08 MED ORDER — IPRATROPIUM-ALBUTEROL 0.5-2.5 (3) MG/3ML IN SOLN: 3.0000 mL | Freq: Once | RESPIRATORY_TRACT | Status: DC

## 2023-08-08 MED ORDER — FLUTICASONE FUROATE-VILANTEROL 100-25 MCG/ACT IN AEPB
1.0000 | INHALATION_SPRAY | Freq: Every day | RESPIRATORY_TRACT | Status: DC
  Administered 2023-08-08 – 2023-08-13 (×6): 1 via RESPIRATORY_TRACT
  Filled 2023-08-08 (×2): qty 28

## 2023-08-08 MED ORDER — FUROSEMIDE 20 MG PO TABS: 40.0000 mg | ORAL_TABLET | Freq: Two times a day (BID) | ORAL | Status: DC

## 2023-08-08 MED ORDER — AMLODIPINE BESYLATE 5 MG PO TABS
10.0000 mg | ORAL_TABLET | Freq: Every day | ORAL | Status: DC
  Administered 2023-08-08: 10 mg via ORAL
  Filled 2023-08-08: qty 2

## 2023-08-08 MED ORDER — PREDNISONE 20 MG PO TABS
40.0000 mg | ORAL_TABLET | Freq: Every day | ORAL | Status: AC
  Administered 2023-08-09 – 2023-08-12 (×4): 40 mg via ORAL
  Filled 2023-08-08 (×4): qty 2

## 2023-08-08 MED ORDER — IPRATROPIUM-ALBUTEROL 0.5-2.5 (3) MG/3ML IN SOLN
3.0000 mL | Freq: Once | RESPIRATORY_TRACT | Status: AC
  Administered 2023-08-08: 3 mL via RESPIRATORY_TRACT
  Filled 2023-08-08: qty 3

## 2023-08-08 MED ORDER — GUAIFENESIN ER 600 MG PO TB12
600.0000 mg | ORAL_TABLET | Freq: Two times a day (BID) | ORAL | Status: DC
  Administered 2023-08-08 – 2023-08-13 (×11): 600 mg via ORAL
  Filled 2023-08-08 (×11): qty 1

## 2023-08-08 MED ORDER — SODIUM BICARBONATE 650 MG PO TABS
1300.0000 mg | ORAL_TABLET | Freq: Two times a day (BID) | ORAL | Status: DC
  Administered 2023-08-08 – 2023-08-13 (×11): 1300 mg via ORAL
  Filled 2023-08-08 (×11): qty 2

## 2023-08-08 MED ORDER — MONTELUKAST SODIUM 10 MG PO TABS
10.0000 mg | ORAL_TABLET | Freq: Every day | ORAL | Status: DC
  Administered 2023-08-08 – 2023-08-13 (×6): 10 mg via ORAL
  Filled 2023-08-08 (×6): qty 1

## 2023-08-08 MED ORDER — LABETALOL HCL 5 MG/ML IV SOLN: 10.0000 mg | INTRAVENOUS | Status: DC | PRN

## 2023-08-08 MED ORDER — PANTOPRAZOLE SODIUM 40 MG PO TBEC
40.0000 mg | DELAYED_RELEASE_TABLET | Freq: Every day | ORAL | Status: DC
  Administered 2023-08-08 – 2023-08-13 (×6): 40 mg via ORAL
  Filled 2023-08-08 (×6): qty 1

## 2023-08-08 MED ORDER — ACETAMINOPHEN 325 MG PO TABS
650.0000 mg | ORAL_TABLET | Freq: Four times a day (QID) | ORAL | Status: DC | PRN
  Administered 2023-08-11: 650 mg via ORAL
  Filled 2023-08-08: qty 2

## 2023-08-08 MED ORDER — LORAZEPAM 0.5 MG PO TABS
0.5000 mg | ORAL_TABLET | Freq: Two times a day (BID) | ORAL | Status: DC | PRN
  Administered 2023-08-12 (×2): 0.5 mg via ORAL
  Filled 2023-08-08 (×2): qty 1

## 2023-08-08 MED ORDER — ONDANSETRON HCL 4 MG/2ML IJ SOLN: 4.0000 mg | Freq: Four times a day (QID) | INTRAMUSCULAR | Status: DC | PRN

## 2023-08-08 MED ORDER — CITALOPRAM HYDROBROMIDE 20 MG PO TABS
10.0000 mg | ORAL_TABLET | Freq: Every day | ORAL | Status: DC
Start: 2023-08-08 — End: 2023-08-13
  Administered 2023-08-08 – 2023-08-13 (×6): 10 mg via ORAL
  Filled 2023-08-08 (×6): qty 1

## 2023-08-08 MED ORDER — SODIUM CHLORIDE 0.9 % IV SOLN
500.0000 mg | INTRAVENOUS | Status: DC
  Administered 2023-08-08 – 2023-08-09 (×2): 500 mg via INTRAVENOUS
  Filled 2023-08-08 (×2): qty 5

## 2023-08-08 MED ORDER — SODIUM BICARBONATE 650 MG PO TABS: 1300.0000 mg | ORAL_TABLET | Freq: Two times a day (BID) | ORAL | Status: DC

## 2023-08-08 MED ORDER — CARVEDILOL 12.5 MG PO TABS
12.5000 mg | ORAL_TABLET | Freq: Two times a day (BID) | ORAL | Status: DC
  Administered 2023-08-08: 12.5 mg via ORAL
  Filled 2023-08-08: qty 1

## 2023-08-08 MED ORDER — ONDANSETRON HCL 4 MG PO TABS: 4.0000 mg | ORAL_TABLET | Freq: Four times a day (QID) | ORAL | Status: DC | PRN

## 2023-08-08 MED ORDER — OXYCODONE HCL 5 MG PO TABS: 5.0000 mg | ORAL_TABLET | Freq: Four times a day (QID) | ORAL | Status: DC | PRN

## 2023-08-08 MED ORDER — ACETAMINOPHEN 650 MG RE SUPP: 650.0000 mg | Freq: Four times a day (QID) | RECTAL | Status: DC | PRN

## 2023-08-08 MED ORDER — HYDRALAZINE HCL 50 MG PO TABS
100.0000 mg | ORAL_TABLET | Freq: Three times a day (TID) | ORAL | Status: DC
  Administered 2023-08-08: 100 mg via ORAL
  Filled 2023-08-08: qty 2

## 2023-08-08 MED ORDER — ALBUTEROL SULFATE (2.5 MG/3ML) 0.083% IN NEBU
2.5000 mg | INHALATION_SOLUTION | RESPIRATORY_TRACT | Status: DC | PRN
  Administered 2023-08-09: 2.5 mg via RESPIRATORY_TRACT
  Filled 2023-08-08: qty 3

## 2023-08-08 MED ORDER — PREDNISONE 20 MG PO TABS
40.0000 mg | ORAL_TABLET | Freq: Once | ORAL | Status: AC
  Administered 2023-08-08: 40 mg via ORAL
  Filled 2023-08-08: qty 2

## 2023-08-08 MED ORDER — FOLIC ACID 1 MG PO TABS
1.0000 mg | ORAL_TABLET | Freq: Every day | ORAL | Status: DC
Start: 2023-08-08 — End: 2023-08-13
  Administered 2023-08-08 – 2023-08-13 (×6): 1 mg via ORAL
  Filled 2023-08-08 (×6): qty 1

## 2023-08-08 MED ORDER — DOXAZOSIN MESYLATE 4 MG PO TABS
4.0000 mg | ORAL_TABLET | Freq: Two times a day (BID) | ORAL | Status: DC
  Administered 2023-08-08: 4 mg via ORAL
  Filled 2023-08-08: qty 1

## 2023-08-08 MED ORDER — HYDROCODONE-ACETAMINOPHEN 5-325 MG PO TABS: 1.0000 | ORAL_TABLET | ORAL | Status: DC | PRN

## 2023-08-08 MED ORDER — FUROSEMIDE 10 MG/ML IJ SOLN
20.0000 mg | Freq: Two times a day (BID) | INTRAMUSCULAR | Status: DC
  Administered 2023-08-08: 20 mg via INTRAVENOUS
  Filled 2023-08-08: qty 2

## 2023-08-08 MED ORDER — SODIUM CHLORIDE 0.9 % IV SOLN
1.0000 g | INTRAVENOUS | Status: DC
  Administered 2023-08-08 – 2023-08-09 (×2): 1 g via INTRAVENOUS
  Filled 2023-08-08 (×2): qty 10

## 2023-08-08 MED ORDER — BUDESON-GLYCOPYRROL-FORMOTEROL 160-9-4.8 MCG/ACT IN AERO: 2.0000 | INHALATION_SPRAY | Freq: Two times a day (BID) | RESPIRATORY_TRACT | Status: DC

## 2023-08-08 MED ORDER — HYDRALAZINE HCL 100 MG PO TABS: 100.0000 mg | ORAL_TABLET | Freq: Three times a day (TID) | ORAL | Status: DC

## 2023-08-08 MED ORDER — PREDNISONE 20 MG PO TABS: 40.0000 mg | ORAL_TABLET | Freq: Every day | ORAL | Status: DC

## 2023-08-08 MED ORDER — UMECLIDINIUM BROMIDE 62.5 MCG/ACT IN AEPB
1.0000 | INHALATION_SPRAY | Freq: Every day | RESPIRATORY_TRACT | Status: DC
Start: 2023-08-08 — End: 2023-08-13
  Administered 2023-08-08 – 2023-08-13 (×6): 1 via RESPIRATORY_TRACT
  Filled 2023-08-08: qty 7

## 2023-08-08 MED ORDER — ATORVASTATIN CALCIUM 40 MG PO TABS
40.0000 mg | ORAL_TABLET | Freq: Every day | ORAL | Status: DC
Start: 2023-08-08 — End: 2023-08-13
  Administered 2023-08-08 – 2023-08-13 (×6): 40 mg via ORAL
  Filled 2023-08-08 (×6): qty 1

## 2023-08-08 NOTE — TOC Initial Note (Signed)
 Transition of Care [facility]) - Initial/Assessment Note    Patient Details  Name: [name] MRN: [id number] Date of Birth: 14-Mar-1934  [facility]) CM/SW Contact:    Alesia Richards, RN Phone Number: 08/08/2023, 3:32 PM  Clinical Narrative:                  CM to patient's room regarding TOC screening assessment. CM introduced case management role and discharge care planning process. Patient verbalized understanding and agreement with TOC screening interview.  Patient's daughter, Hollie Salk and sister at patient's bedside. Patient states lives with granddaughter, [name]. Patient states uses quad cane, tub transfer bench, and rollator. Patient has received homecare visits from a nurse regarding medication set up and does not recall name of agency. Patient no previous SNF experience. Patient states daughter or granddaughter will transport patient home.  Expected Discharge Plan: Home/Self Care Barriers to Discharge: Continued Medical Work up   Patient Goals and CMS Choice Patient states their goals for this hospitalization and ongoing recovery are:: to work on breathing and her kidneys  Expected Discharge Plan and Services   Discharge Planning Services: CM Consult     Prior Living Arrangements/Services   Lives with:: Other (Comment) (granddaughter, Visual merchandiser) Patient language and need for interpreter reviewed:: No Do you feel safe going back to the place where you live?: Yes      Need for Family Participation in Patient Care: Yes (Comment) Care giver support system in place?: Yes (comment) Current home services: DME (quad cane, rollator, nebulizer, tub transfer bench) Criminal Activity/Legal Involvement Pertinent to Current Situation/Hospitalization: No - Comment as needed  Activities of Daily Living   ADL Screening (condition at time of admission) Independently performs ADLs?: Yes (appropriate for developmental age) Is the patient deaf or have difficulty hearing?:  Yes Does the patient have difficulty seeing, even when wearing glasses/contacts?: No Does the patient have difficulty concentrating, remembering, or making decisions?: Yes  Permission Sought/Granted Permission sought to share information with : Case Manager, Family Supports Permission granted to share information with : Yes, Verbal Permission Granted    Emotional Assessment Appearance:: Appears stated age Attitude/Demeanor/Rapport: Engaged Affect (typically observed): Calm Orientation: : Oriented to Self, Oriented to Place, Oriented to  Time, Oriented to Situation Alcohol / Substance Use: Not Applicable Psych Involvement: No (comment)  Admission diagnosis:  Metabolic acidosis [E87.20] Anemia associated with chronic renal failure [N18.9, D63.1] AKI (acute kidney injury) (HCC) [N17.9] Pain in right upper arm [M79.621] Moderate asthma with exacerbation, unspecified whether persistent [J45.901] Acute renal failure superimposed on chronic kidney disease, unspecified acute renal failure type, unspecified CKD stage (HCC) [N17.9, N18.9] Acute kidney injury superimposed on stage 5 chronic kidney disease, not on chronic dialysis (HCC) [N17.9, N18.5] Patient Active Problem List   Diagnosis Date Noted   Acute kidney injury superimposed on stage 5 chronic kidney disease, not on chronic dialysis (HCC) 08/08/2023   Hypertensive nephrosclerosis 08/08/2023   Anemia of chronic kidney failure, stage 5 (HCC) 08/08/2023   Metabolic acidosis 08/08/2023   Acute exacerbation of moderate persistent extrinsic asthma 08/08/2023   Acute on chronic heart failure with preserved ejection fraction (HFpEF) (HCC) 08/08/2023   AKI (acute kidney injury) (HCC) 08/08/2023   CAP (community acquired pneumonia) 10/31/2022   Hypertensive urgency 10/31/2022   Moderate persistent asthma 10/31/2022   Hypomagnesemia 10/31/2022   CKD (chronic kidney disease), stage IV (HCC) 10/31/2022   Pulmonary hypertension, unspecified  (HCC) 12/06/2021   Chronic cough 10/18/2021   Chronic rhinitis 11/24/2019  Nausea 11/03/2018   Elevated troponin 11/03/2018   Hyponatremia 11/03/2018   Hypertensive heart disease with hypertensive chronic kidney disease 11/30/2017   Fasting hyperglycemia 11/30/2017   Renal cyst 03/27/2016   CKD (chronic kidney disease) stage 3, GFR 30-59 ml/min (HCC) 08/23/2015   Subjective hearing change 06/29/2014   Resistant hypertension 06/29/2014   Lumbago 06/15/2014   Pre-diabetes vs early DM  06/15/2014   Dyspnea 04/27/2014   Asthma, chronic 04/13/2014   Elevated uric acid in blood 04/13/2014   Essential hypertension 03/27/2014   History of DVT (deep vein thrombosis)    Palpitations 01/05/2014   Anemia of chronic disease 2014/01/06   Death of family member 2013/09/10   Leg edema 06/20/2013   Bereavement due to life event 04/21/2013   Other dysphagia 02/11/2013   Colon cancer screening 02/11/2013   Anxiety state 01/20/2013   Leg cramps 01/20/2013   Back pain 12/05/2012   Family hx of colon cancer 10/21/2012   Family hx of lung cancer 10/21/2012   Family hx-breast malignancy 10/21/2012   Renal insufficiency creatinine 1.3 10/21/2012   Anemia, chronic disease 10/21/2012   GERD (gastroesophageal reflux disease) 09/08/2012   HTN (hypertension) 09/08/2012   Hyperlipidemia 09/08/2012   Varicose veins of leg with complications 12/05/2010   PCP:  [name], MD Pharmacy:   [facility] [place], [place] - [address] [facility] [address] [place] [address] Phone: [phone] Fax: [phone]  [facility] [address] [place] [address] Phone: [phone] Fax: [phone]     Social Drivers of Health (SDOH) Social History: SDOH Screenings   Food Insecurity: Food Insecurity Present (08/08/2023)  Housing: Low Risk  (08/08/2023)  Transportation Needs: No Transportation Needs (08/08/2023)  Utilities:  Not At Risk (08/08/2023)  Depression (PHQ2-9): Low Risk  (01/16/2023)  Financial Resource Strain: Low Risk  (04/14/2022)  Social Connections: Moderately Isolated (08/08/2023)  Tobacco Use: Low Risk  (08/08/2023)   SDOH Interventions:     Readmission Risk Interventions    08/08/2023    3:21 PM 11/02/2022   12:32 PM  Readmission Risk Prevention Plan  Transportation Screening  Complete  Medication Review (RN Care Manager) Complete Complete  PCP or Specialist appointment within 3-5 days of discharge Complete Complete  HRI or Home Care Consult Complete Complete  SW Recovery Care/Counseling Consult  Complete  Palliative Care Screening  Not Applicable  Skilled Nursing Facility Complete Not Applicable

## 2023-08-08 NOTE — ED Provider Notes (Signed)
 [place] EMERGENCY DEPARTMENT AT [facility] Provider Note   CSN: [id number] Arrival date & time: 08/07/23  2328     History  Chief Complaint  Patient presents with   Shortness of Breath    [name] is a 89 y.o. female.  The history is provided by the patient.  Shortness of Breath She has history of hypertension, diabetes, hyperlipidemia, GERD, lupus, asthma, chronic kidney disease and comes in because of an exacerbation of asthma.  She states that her asthma flared up yesterday but settled down and then got worse again tonight.  There has been a cough productive of dark sputum.  She denies fever, chills, sweats.  She states that she had run out of her albuterol inhaler.  She denies fever, chills, sweats.  She denies arthralgias but is complaining of pain in her right upper arm which started yesterday.  She denies any trauma.   Home Medications Prior to Admission medications   Medication Sig Start Date End Date Taking? Authorizing Provider  acetaminophen (TYLENOL) 500 MG tablet Take 500 mg by mouth in the morning and at bedtime.    [provider]  albuterol (PROVENTIL) (2.5 MG/3ML) 0.083% nebulizer solution Take 3 mLs (2.5 mg total) by nebulization every 6 (six) hours as needed for shortness of breath or wheezing. 08/03/22   Carlisle Beers, FNP  albuterol (VENTOLIN HFA) 108 (90 Base) MCG/ACT inhaler USE 2 PUFFS EVERY 6 HOURS AS NEEDED FOR WHEEZING. 04/19/23   Leslye Peer, MD  amLODipine (NORVASC) 10 MG tablet Take 1 tablet (10 mg total) by mouth daily. Patient taking differently: Take 10 mg by mouth at bedtime. 11/07/22   Osvaldo Shipper, MD  atorvastatin (LIPITOR) 40 MG tablet Take 1 tablet (40 mg total) by mouth daily. Patient taking differently: Take 40 mg by mouth every evening. 04/21/22   Nahser, Deloris Ping, MD  atropine 1 % ophthalmic solution Place 1 drop into both eyes 2 (two) times daily as needed.    [provider]   Azelastine-Fluticasone 137-50 MCG/ACT SUSP PLACE ONE SPRAY IN EACH NOSTRIL TWICE DAILY (IN THE MORNING AND AT BEDTIME) Patient taking differently: Place 1 spray into both nostrils in the morning and at bedtime. 03/12/23   Leslye Peer, MD  azithromycin (ZITHROMAX) 250 MG tablet Take 2 tablets on day 1, then 1 tablet daily on days 2 through 5 08/07/23 08/12/23  Panosh, Neta Mends, MD  Budeson-Glycopyrrol-Formoterol (BREZTRI AEROSPHERE) 160-9-4.8 MCG/ACT AERO INHALE 2 PUFFS INTO THE LUNGS TWICE DAILY, IN THE MORNING & AT BEDTIME. 04/19/23   Byrum, Les Pou, MD  carvedilol (COREG) 12.5 MG tablet Take 1 tablet (12.5 mg total) by mouth 2 (two) times daily. 04/06/23   Nahser, Deloris Ping, MD  cetirizine (ZYRTEC) 10 MG tablet Take 10 mg by mouth daily as needed for allergies or rhinitis.    [provider]  citalopram (CELEXA) 20 MG tablet Take 0.5 tablets (10 mg total) by mouth daily. 08/07/23   Panosh, Neta Mends, MD  cloNIDine (CATAPRES) 0.1 MG tablet TAKE ONE TABLET BY MOUTH TWICE DAILY AS NEEDED for hypertension 08/07/23   Panosh, Neta Mends, MD  clopidogrel (PLAVIX) 75 MG tablet Take 1 tablet (75 mg total) by mouth daily. 08/07/23   Panosh, Neta Mends, MD  diclofenac Sodium (VOLTAREN) 1 % GEL Apply 4 g topically 2 (two) times daily as needed (painful areas). 05/02/22   Ellsworth Lennox, PA-C  doxazosin (CARDURA) 4 MG tablet Take 1 tablet (4 mg total) by  mouth 2 (two) times daily. 05/28/23   Sabharwal, Aditya, DO  ferrous sulfate 325 (65 FE) MG EC tablet Take 1 tablet (325 mg total) by mouth daily with breakfast. Patient taking differently: Take 325 mg by mouth. 12/15/14   Panosh, Neta Mends, MD  fluticasone (FLONASE) 50 MCG/ACT nasal spray Place 2 sprays into both nostrils as needed for allergies or rhinitis.    [provider]  folic acid (FOLVITE) 1 MG tablet Take 1 tablet (1 mg total) by mouth daily. 08/07/23   Panosh, Neta Mends, MD  furosemide (LASIX) 40 MG tablet Take 1.5 tablets (60 mg total) by mouth 2  (two) times daily. 06/19/23 09/17/23  Jacklynn Ganong, FNP  hydrALAZINE (APRESOLINE) 100 MG tablet Take 1 tablet (100 mg total) by mouth 3 (three) times daily. Patient taking differently: Take 100 mg by mouth 3 (three) times daily. 05/21/23   Nahser, Deloris Ping, MD  isosorbide mononitrate (IMDUR) 30 MG 24 hr tablet Take 1 tablet (30 mg total) by mouth daily. 04/02/23 07/01/23  Jacklynn Ganong, FNP  LORazepam (ATIVAN) 0.5 MG tablet Take 1 tablet (0.5 mg total) by mouth 2 (two) times daily as needed. for anxiety 05/28/23   Eulis Foster, FNP  metolazone (ZAROXOLYN) 2.5 MG tablet Take 1 tablet (2.5 mg total) by mouth once a week. Take every Friday 02/26/23   Jacklynn Ganong, FNP  montelukast (SINGULAIR) 10 MG tablet Take 1 tablet (10 mg total) by mouth daily. Patient taking differently: Take 10 mg by mouth at bedtime. 04/19/23   Leslye Peer, MD  oxyCODONE (OXY IR/ROXICODONE) 5 MG immediate release tablet Take 1 tablet (5 mg total) by mouth every 6 (six) hours as needed for severe pain (pain score 7-10). 05/02/23   Panosh, Neta Mends, MD  pantoprazole (PROTONIX) 40 MG tablet Take 1 tablet (40 mg total) by mouth daily. Patient taking differently: Take 40 mg by mouth 2 (two) times daily. 04/19/23   Leslye Peer, MD  potassium chloride (KLOR-CON M) 10 MEQ tablet Take 10 mEq by mouth daily. Take an extra 20 meq (two tablets) on Fridays (this is the day you take Metolazone)    [provider]  RESTASIS 0.05 % ophthalmic emulsion Place 1 drop into both eyes 2 (two) times daily. 10/13/21   [provider]  sodium bicarbonate 650 MG tablet take ONE tab twice a DAY Patient taking differently: Take 650 mg by mouth 2 (two) times daily. 04/22/23   Eulis Foster, FNP  Spacer/Aero-Holding Chambers (AEROCHAMBER PLUS) inhaler Use as instructed Patient taking differently: Use as instructed as needed 05/26/16   Domenick Gong, MD  vitamin B-12 (CYANOCOBALAMIN) 1000 MCG tablet Take 1,000 mcg by mouth  daily.    [provider]  Vitamin D, Cholecalciferol, 25 MCG (1000 UT) TABS Take 1,000 Units by mouth daily. 05/30/23   Worthy Rancher B, FNP  potassium chloride (KLOR-CON) 10 MEQ tablet TAKE FOUR TABLETS BY MOUTH DAILY. Patient taking differently: Take 20 mEq by mouth 2 (two) times daily. 10/19/22 11/06/22  Panosh, Neta Mends, MD      Allergies    Penicillins, Aspirin, and Other    Review of Systems   Review of Systems  Respiratory:  Positive for shortness of breath.   All other systems reviewed and are negative.   Physical Exam Updated Vital Signs BP (!) 189/80 (BP Location: Right Arm)   Pulse 79   Temp 98.5 F (36.9 C)   Resp 14   Wt 61.7  kg   SpO2 100%   BMI 24.09 kg/m  Physical Exam Vitals and nursing note reviewed.   89 year old female, resting comfortably and in no acute distress. Vital signs are significant for elevated blood pressure. Oxygen saturation is 100%, which is normal. Head is normocephalic and atraumatic. PERRLA, EOMI. Oropharynx is clear. Neck is nontender and supple without adenopathy or JVD. Lungs have diffuse expiratory wheezes without rales or rhonchi. Chest is nontender. Heart has regular rate and rhythm without murmur. Abdomen is soft, flat, nontender. Extremities have trace edema, full range of motion is present.  There is tenderness palpation of the soft tissues of the right upper arm, and there is pain with passive range of motion of the right shoulder and elbow. Skin is warm and dry without rash. Neurologic: Mental status is normal, cranial nerves are intact, moves all extremities equally.  ED Results / Procedures / Treatments   Labs (all labs ordered are listed, but only abnormal results are displayed) Labs Reviewed  CBC WITH DIFFERENTIAL/PLATELET - Abnormal; Notable for the following components:      Result Value   RBC 3.41 (*)    Hemoglobin 10.2 (*)    HCT 32.3 (*)    All other components within normal limits  BASIC METABOLIC PANEL  - Abnormal; Notable for the following components:   CO2 16 (*)    Glucose, Bld 100 (*)    BUN 75 (*)    Creatinine, Ser 5.71 (*)    Calcium 8.0 (*)    GFR, Estimated 7 (*)    All other components within normal limits   Radiology DG Chest 2 View Result Date: 08/08/2023 CLINICAL DATA:  Shortness of breath beginning today. History of asthma. Right shoulder and right humerus pain. EXAM: CHEST - 2 VIEW COMPARISON:  01/16/2023 FINDINGS: Cardiac enlargement. Mild central vascular congestion. Interstitial changes in the lung bases likely representing edema. Prominent right paratracheal/right hilar shadows increasing since previous study. This could represent central vascularity or lymphadenopathy. CT suggested for further evaluation. No pleural effusion or pneumothorax. Degenerative changes in the spine and shoulders. Calcification of the aorta. IMPRESSION: 1. Cardiac enlargement with pulmonary vascular congestion and mild basilar edema. 2. Increasing right hilar/paratracheal shadow. CT recommended for further evaluation. Electronically Signed   By: Burman Nieves M.D.   On: 08/08/2023 00:33    Procedures Procedures    Medications Ordered in ED Medications  ipratropium-albuterol (DUONEB) 0.5-2.5 (3) MG/3ML nebulizer solution 3 mL (has no administration in time range)  predniSONE (DELTASONE) tablet 40 mg (has no administration in time range)    ED Course/ Medical Decision Making/ A&P                                 Medical Decision Making Amount and/or Complexity of Data Reviewed Labs: ordered. Radiology: ordered.  Risk Prescription drug management. Decision regarding hospitalization.   Exacerbation of asthma.  Right upper extremity pain which seems to be musculoskeletal.  Chest x-ray shows cardiac enlargement with pulmonary vascular congestion with also noted increasing right hilar and paratracheal shadow with recommendation for CT scan.  I have independently viewed the images, and  agree with radiologist interpretation.  I have ordered x-rays of the right humerus.  I have ordered a nebulizer treatment albuterol and ipratropium and initial dose of prednisone.  I reviewed her laboratory tests, and my interpretation is stable anemia, worsening renal failure with new onset of metabolic acidosis likely  secondary to renal failure.  Creatinine is 5.71 compared with 3.89 on 05/03/2023.  Highest creatinine I could find on record prior to today was 4.25 on 12/15/2022.  I am concerned about the sudden jump in creatinine with associated metabolic acidosis and I feel she will need to be admitted for attempts to maximize her renal function.  Otherwise, she will likely need dialysis very soon.  I have reviewed her medications and, other than diuretics, I do not see any thing that she is taking that is inherently nephrotoxic.  I reevaluated her following nebulizer treatment and noted some persistent wheezing and I ordered a second nebulizer treatment.  I have discussed case with Dr. Para March of [facility], who agrees to admit the patient.  Final Clinical Impression(s) / ED Diagnoses Final diagnoses:  Acute renal failure superimposed on chronic kidney disease, unspecified acute renal failure type, unspecified CKD stage (HCC)  Moderate asthma with exacerbation, unspecified whether persistent  Anemia associated with chronic renal failure  Pain in right upper arm  Metabolic acidosis    Rx / DC Orders ED Discharge Orders     None         Dione Booze, MD 08/08/23 [phone]

## 2023-08-08 NOTE — Assessment & Plan Note (Deleted)
 Clinically volume overloaded with 2-3+ pitting edema, lungs clear Currently on Lasix 60 mg twice a day and metolazone on Mondays Continue carvedilol, isosorbide Daily weights Last echo was June 2024 with G2 DD and EF 60 to 65%

## 2023-08-08 NOTE — ED Notes (Signed)
 Patient transported to X-ray

## 2023-08-08 NOTE — Assessment & Plan Note (Signed)
 Will get anemia panel

## 2023-08-08 NOTE — Progress Notes (Signed)
  Echocardiogram 2D Echocardiogram has been performed.  [name] 08/08/2023, 12:06 PM

## 2023-08-08 NOTE — Assessment & Plan Note (Addendum)
 Hypertensive nephrosclerosis Metabolic acidosis Possibly related to intensification of outpatient diuretic regimen Nephrology consult in the a.m. Sodium bicarb tablets-will increase home dose from 650 to 1300 mg pending nephrology consult Monitor renal function and avoid nephrotoxins

## 2023-08-08 NOTE — Assessment & Plan Note (Addendum)
 Possible pneumonia Patient presents with wheezing and a congested cough, normal vitals and WBC but chest CT showing bilateral lower lobe airspace disease Wheezing improved with DuoNeb and prednisone in the ED Continue DuoNeb every 6 as needed Empiric Rocephin and azithromycin pending procalcitonin Antitussives Incentive spirometer

## 2023-08-08 NOTE — Consult Note (Addendum)
 [facility] Renal Consultation Note  Requesting MD: Burnadette Pop, MD Indication for Consultation:  Acute on chronic kidney injury, volume overload  Chief complaint: SOB  HPI:  [name] is a 89 y.o. female with history of HTN, T2DM, HLD, GERD, Lupus, Asthma, CKD5 who presented to the ED due to shortness of breath.  Nephrology is consulted for possible acute on chronic worsening kidney function to 5.71 from a base line of around ~3.8. Most recent creatinine I can see in EPIC is 3.89 from 05/03/23. BUN was elevated to 75 and CO2 was decreased to 16. Potassium was stable at 4.1. CXR and Chest CT consistent with pulmonary edema.  Repeat echo pending. Received 20mg  IV lasix in ED.  Hospitalist increasing sodium bicarb tablets to 1300mg  pending our recommendations.  Unclear dry weight based on chart review.  Weight seems to be around 69 kg.  Patient reports increasing shortness of breath over the past week.  Reports that she has been out of her albuterol for a while but has been taking her Breztri.  States that she has not been taking her diuretics as prescribed.  She is supposed to be on Lasix daily and metolazone once weekly.  Reports that she has been urinating normally.  But notes that her p.o. intake over the past couple months has been decreased.  She is drinking normally.  No blood in her urine, urine is not frothy.  She has not definitively noticed worsening swelling in her lower extremities over the past week.  Has not had vomiting or nausea, no diarrhea.  Notes she follows with Dr. [name] for her kidneys.  Does not measure her weight at home regularly.  She does note that she had lupus almost 20 years ago but does not remember what caused it.  Denies knowledge that she has diabetes.  Patient follows with cardiology outpatient for chronic diastolic heart failure and elevated right-sided heart pressures.  On reassessment with Dr. Malen Gauze. Called patient's granddaughter,  who confirms patient has not been taking medicines as prescribed.  While visiting patient's blood pressure is noted to be much lower than my previous assessment.  Maps now around 65.  Per chart review patient received all of her blood pressure medicines.  States that patient would not want dialysis and this has been previously discussed.  Creatinine  Date/Time Value Ref Range Status  09/13/2022 12:00 AM 3.1 (A) 0.5 - 1.1 Final  11/16/2021 12:00 AM 2.8 (A) 0.5 - 1.1 Final  11/04/2020 12:00 AM 2.4 (A) 0.5 - 1.1 Final  07/01/2020 12:00 AM 2.3 (A) 0.5 - 1.1 Final  11/13/2019 12:00 AM 2.5 (A) 0.5 - 1.1 Final  10/02/2018 12:00 AM 1.9 (A) 0.5 - 1.1 Final  05/29/2018 12:00 AM 1.9 (A) 0.5 - 1.1 Final  12/03/2017 12:00 AM 2.1 (A) 0.5 - 1.1 Final  06/19/2016 01:01 PM 1.4 (H) 0.6 - 1.1 mg/dL Final  16/02/9603 54:09 AM 1.6 (H) 0.6 - 1.1 mg/dL Final  81/19/1478 29:56 AM 1.5 (H) 0.6 - 1.1 mg/dL Final  21/30/8657 84:69 AM 1.4 (H) 0.6 - 1.1 mg/dL Final  62/95/2841 32:44 PM 1.7 (H) 0.6 - 1.1 mg/dL Final  05/17/7251 66:44 PM 1.7 (H) 0.6 - 1.1 mg/dL Final  03/47/4259 56:38 AM 1.8 (H) 0.6 - 1.1 mg/dL Final  75/64/3329 51:88 AM 1.3 (H) 0.6 - 1.1 mg/dL Final  41/66/0630 16:01 PM 1.3 (H) 0.6 - 1.1 mg/dL Final   Creat  Date/Time Value Ref Range Status  03/10/2020 11:49 AM 2.31 (H)  0.60 - 0.88 mg/dL Final    Comment:    For patients >86 years of age, the reference limit for Creatinine is approximately 13% higher for people identified as African-American. .    Creatinine, Ser  Date/Time Value Ref Range Status  08/08/2023 12:05 AM 5.71 (H) 0.44 - 1.00 mg/dL Final  65/78/4696 29:52 PM 3.89 (H) 0.44 - 1.00 mg/dL Final  84/13/2440 10:27 PM 3.93 (H) 0.44 - 1.00 mg/dL Final  25/36/6440 34:74 PM 3.87 (H) 0.44 - 1.00 mg/dL Final  25/95/6387 56:43 PM 3.85 (H) 0.44 - 1.00 mg/dL Final  32/95/1884 16:60 PM 4.20 (H) 0.44 - 1.00 mg/dL Final  63/05/6008 93:23 PM 3.82 (H) 0.40 - 1.20 mg/dL Final  55/73/2202 54:27 AM  4.18 (H) 0.44 - 1.00 mg/dL Final  11/05/7626 31:51 AM 4.25 (H) 0.44 - 1.00 mg/dL Final  76/16/0737 10:62 PM 3.68 (H) 0.44 - 1.00 mg/dL Final  69/48/5462 70:35 AM 3.50 (H) 0.44 - 1.00 mg/dL Final  00/93/8182 99:37 AM 3.53 (H) 0.44 - 1.00 mg/dL Final  16/96/7893 81:01 AM 3.56 (H) 0.44 - 1.00 mg/dL Final  75/02/2584 27:78 AM 3.20 (H) 0.44 - 1.00 mg/dL Final  24/23/5361 44:31 AM 2.95 (H) 0.44 - 1.00 mg/dL Final  54/00/8676 19:50 AM 2.99 (H) 0.44 - 1.00 mg/dL Final  93/26/7124 58:09 AM 3.02 (H) 0.44 - 1.00 mg/dL Final  98/33/8250 53:97 PM 2.94 (H) 0.40 - 1.20 mg/dL Final  67/34/1937 90:24 AM 2.34 (H) 0.44 - 1.00 mg/dL Final  09/73/5329 92:42 PM 2.38 (H) 0.44 - 1.00 mg/dL Final  68/34/1962 22:97 PM 2.27 (H) 0.40 - 1.20 mg/dL Final  98/92/1194 17:40 PM 2.28 (H) 0.44 - 1.00 mg/dL Final  81/44/8185 63:14 PM 2.30 (H) 0.44 - 1.00 mg/dL Final  97/06/6376 58:85 PM 2.37 (H) 0.44 - 1.00 mg/dL Final  02/77/4128 78:67 PM 2.29 (H) 0.44 - 1.00 mg/dL Final  67/20/9470 96:28 PM 2.35 (H) 0.44 - 1.00 mg/dL Final  36/62/9476 54:65 AM 2.26 (H) 0.44 - 1.00 mg/dL Final  03/54/6568 12:75 AM 2.64 (H) 0.40 - 1.20 mg/dL Final  17/00/1749 44:96 PM 2.45 (H) 0.40 - 1.20 mg/dL Final  75/91/6384 66:59 AM 2.34 (H) 0.40 - 1.20 mg/dL Final  93/57/0177 93:90 AM 2.57 (H) 0.40 - 1.20 mg/dL Final  30/01/2329 07:62 PM 2.97 (H) 0.40 - 1.20 mg/dL Final  26/33/3545 62:56 PM 3.06 (H) 0.40 - 1.20 mg/dL Final  38/93/7342 87:68 AM 1.56 (H) 0.40 - 1.20 mg/dL Final  11/57/2620 35:59 AM 1.67 (H) 0.44 - 1.00 mg/dL Final  74/16/3845 36:46 AM 1.56 (H) 0.44 - 1.00 mg/dL Final  80/32/1224 82:50 PM 1.73 (H) 0.44 - 1.00 mg/dL Final  03/70/4888 91:69 AM 2.16 (H) 0.40 - 1.20 mg/dL Final  45/07/8880 80:03 PM 1.88 (H) 0.44 - 1.00 mg/dL Final  49/17/9150 56:97 PM 2.23 (H) 0.40 - 1.20 mg/dL Final  94/80/1655 37:48 AM 2.20 (H) 0.40 - 1.20 mg/dL Final  27/11/8673 44:92 PM 1.86 (H) 0.40 - 1.20 mg/dL Final  01/00/7121 97:58 PM 1.85 (H) 0.40 - 1.20  mg/dL Final  83/25/4982 64:15 AM 1.51 (H) 0.40 - 1.20 mg/dL Final  83/01/4075 80:88 AM 1.63 (H) 0.40 - 1.20 mg/dL Final  03/17/1593 58:59 AM 1.66 (H) 0.40 - 1.20 mg/dL Final  29/24/4628 63:81 AM 1.34 (H) 0.40 - 1.20 mg/dL Final  77/03/6578 03:83 PM 1.56 (H) 0.40 - 1.20 mg/dL Final  33/83/2919 16:60 AM 1.21 (H) 0.40 - 1.20 mg/dL Final  60/08/5995 74:14 AM 1.42 (H) 0.40 - 1.20 mg/dL Final  23/95/3202 33:43 AM  1.45 (H) 0.40 - 1.20 mg/dL Final  04/54/0981 19:14 AM 1.60 (H) 0.40 - 1.20 mg/dL Final     PMHx:   Past Medical History:  Diagnosis Date   Acute superficial venous thrombosis of left lower extremity 03/27/2014   concern about hx and potential extensive on exam tenderness calf and low dose lovenox 40 qd 2-4 weekselevetion and close  fu advised .    Allergy    Anemia    Anxiety    Arthritis    "shoulders" (09/09/2012)   Asthma 09/08/2012   Carotid bruit    [place] 2018  low risk 1 - 39%   Cataract    bil cateracts removed   Chronic kidney disease    Clotting disorder (HCC)    blood clots in legs   Diabetes mellitus without complication (HCC)    "Borderline" per pt; being monitored.  no meds per pt   Diverticulosis    Dyspnea 04/27/2014   GERD (gastroesophageal reflux disease)    Headache(784.0)    "related to my high blood pressure" (09/09/2012)   Hiatal hernia    History of DVT (deep vein thrombosis)    "RLE" (09/09/2012) coumadine cant take asa so on plavix    HTN (hypertension) 09/08/2012   Hyperlipidemia    Lupus    "cured years ago" (09/09/2012)   Other dysphagia 02/11/2013   Syncope and collapse 01/21/2018   Varicose veins    Varicose veins of leg with complications 12/05/2010    Past Surgical History:  Procedure Laterality Date   ABDOMINAL HYSTERECTOMY     partial   BREAST EXCISIONAL BIOPSY Right    BREAST SURGERY     CARPAL TUNNEL RELEASE Right 2000's   CARPAL TUNNEL RELEASE Bilateral 10/21/2013   Procedure: BILATERAL  CARPAL TUNNEL RELEASE;  Surgeon: Nicki Reaper, MD;  Location: [facility];  Service: Orthopedics;  Laterality: Bilateral;   COLONOSCOPY     SHOULDER OPEN ROTATOR CUFF REPAIR Bilateral 2000's   TRIGGER FINGER RELEASE Right 10/21/2013   Procedure: RELEASE TRIGGER FINGER/A-1 PULLEY RIGHT MIDDLE AND RIGHT RING;  Surgeon: Nicki Reaper, MD;  Location: [facility];  Service: Orthopedics;  Laterality: Right;   UPPER GASTROINTESTINAL ENDOSCOPY      Family Hx:  Family History  Problem Relation Age of Onset   Hypertension Mother    Lung cancer Mother    Hypertension Father    Lung cancer Father    Breast cancer Sister    Colon cancer Sister        ? 35' s dx - died in 17's   Breast cancer Sister    Hypertension Brother    Rectal cancer Brother    Stomach cancer Brother    Breast cancer Brother    Heart attack Daughter    Esophageal cancer Daughter    Hypertension Daughter    Diabetes Daughter    Breast cancer Paternal Aunt    Lung cancer Other        both parents    Pancreatic cancer Neg Hx     Social History:  reports that she has never smoked. She has never used smokeless tobacco. She reports that she does not drink alcohol and does not use drugs.  Allergies:  Allergies  Allergen Reactions   Penicillins Nausea And Vomiting and Other (See Comments)    Has patient had a PCN reaction causing immediate rash, facial/tongue/throat swelling, SOB or lightheadedness with hypotension: Y Has patient had a PCN reaction  causing severe rash involving mucus membranes or skin necrosis: Y Has patient had a PCN reaction that required hospitalization: N Has patient had a PCN reaction occurring within the last 10 years: N If all of the above answers are "NO", then may proceed with Cephalosporin use.    Aspirin Other (See Comments)    Wheezing Acetaminophen is OK    Fish Allergy Itching, Nausea And Vomiting and Swelling    Swelling of the face    Medications: Prior to Admission medications    Medication Sig Start Date End Date Taking? Authorizing Provider  acetaminophen (TYLENOL) 500 MG tablet Take 500 mg by mouth 2 (two) times daily as needed for mild pain (pain score 1-3) or headache.   Yes [provider]  albuterol (PROVENTIL) (2.5 MG/3ML) 0.083% nebulizer solution Take 3 mLs (2.5 mg total) by nebulization every 6 (six) hours as needed for shortness of breath or wheezing. 08/03/22  Yes Carlisle Beers, FNP  albuterol (VENTOLIN HFA) 108 (90 Base) MCG/ACT inhaler USE 2 PUFFS EVERY 6 HOURS AS NEEDED FOR WHEEZING. 04/19/23  Yes Leslye Peer, MD  amLODipine (NORVASC) 10 MG tablet Take 1 tablet (10 mg total) by mouth daily. Patient taking differently: Take 10 mg by mouth at bedtime. 11/07/22  Yes Osvaldo Shipper, MD  atorvastatin (LIPITOR) 40 MG tablet Take 1 tablet (40 mg total) by mouth daily. Patient taking differently: Take 40 mg by mouth every evening. 04/21/22  Yes Nahser, Deloris Ping, MD  atropine 1 % ophthalmic solution Place 1 drop into both eyes 2 (two) times daily as needed.   Yes [provider]  Azelastine-Fluticasone 137-50 MCG/ACT SUSP PLACE ONE SPRAY IN EACH NOSTRIL TWICE DAILY (IN THE MORNING AND AT BEDTIME) Patient taking differently: Place 1 spray into both nostrils in the morning and at bedtime. 03/12/23  Yes Leslye Peer, MD  azithromycin (ZITHROMAX) 250 MG tablet Take 2 tablets on day 1, then 1 tablet daily on days 2 through 5 08/07/23 08/12/23 Yes Panosh, Neta Mends, MD  Budeson-Glycopyrrol-Formoterol (BREZTRI AEROSPHERE) 160-9-4.8 MCG/ACT AERO INHALE 2 PUFFS INTO THE LUNGS TWICE DAILY, IN THE MORNING & AT BEDTIME. 04/19/23  Yes Byrum, Les Pou, MD  carvedilol (COREG) 12.5 MG tablet Take 1 tablet (12.5 mg total) by mouth 2 (two) times daily. 04/06/23  Yes Nahser, Deloris Ping, MD  cetirizine (ZYRTEC) 10 MG tablet Take 10 mg by mouth daily as needed for allergies or rhinitis.   Yes [provider]  citalopram (CELEXA) 20 MG tablet Take 0.5  tablets (10 mg total) by mouth daily. 08/07/23  Yes Panosh, Neta Mends, MD  cloNIDine (CATAPRES) 0.1 MG tablet TAKE ONE TABLET BY MOUTH TWICE DAILY AS NEEDED for hypertension 08/07/23  Yes Panosh, Neta Mends, MD  clopidogrel (PLAVIX) 75 MG tablet Take 1 tablet (75 mg total) by mouth daily. 08/07/23  Yes Panosh, Neta Mends, MD  diclofenac Sodium (VOLTAREN) 1 % GEL Apply 4 g topically 2 (two) times daily as needed (painful areas). 05/02/22  Yes Ellsworth Lennox, PA-C  doxazosin (CARDURA) 4 MG tablet Take 1 tablet (4 mg total) by mouth 2 (two) times daily. 05/28/23  Yes Sabharwal, Aditya, DO  ferrous sulfate 325 (65 FE) MG EC tablet Take 1 tablet (325 mg total) by mouth daily with breakfast. 12/15/14  Yes Panosh, Neta Mends, MD  fluticasone (FLONASE) 50 MCG/ACT nasal spray Place 2 sprays into both nostrils as needed for allergies or rhinitis.   Yes [provider]  folic acid (FOLVITE) 1 MG  tablet Take 1 tablet (1 mg total) by mouth daily. 08/07/23  Yes Panosh, Neta Mends, MD  furosemide (LASIX) 40 MG tablet Take 1.5 tablets (60 mg total) by mouth 2 (two) times daily. Patient taking differently: Take 60 mg by mouth 2 (two) times daily. Take 60mg  by mouth in the morning and 60mg  in the afternoon. 06/19/23 09/17/23 Yes Milford, Anderson Malta, FNP  hydrALAZINE (APRESOLINE) 100 MG tablet Take 1 tablet (100 mg total) by mouth 3 (three) times daily. 05/21/23  Yes Nahser, Deloris Ping, MD  isosorbide mononitrate (IMDUR) 30 MG 24 hr tablet Take 1 tablet (30 mg total) by mouth daily. 04/02/23 02/12/24 Yes Milford, Anderson Malta, FNP  LORazepam (ATIVAN) 0.5 MG tablet Take 1 tablet (0.5 mg total) by mouth 2 (two) times daily as needed. for anxiety 05/28/23  Yes Worthy Rancher B, FNP  metolazone (ZAROXOLYN) 2.5 MG tablet Take 1 tablet (2.5 mg total) by mouth once a week. Take every Friday 02/26/23  Yes Milford, Anderson Malta, FNP  montelukast (SINGULAIR) 10 MG tablet Take 1 tablet (10 mg total) by mouth daily. Patient taking differently: Take 10 mg by mouth  at bedtime. 04/19/23  Yes Leslye Peer, MD  oxyCODONE (OXY IR/ROXICODONE) 5 MG immediate release tablet Take 1 tablet (5 mg total) by mouth every 6 (six) hours as needed for severe pain (pain score 7-10). 05/02/23  Yes Panosh, Neta Mends, MD  pantoprazole (PROTONIX) 40 MG tablet Take 1 tablet (40 mg total) by mouth daily. Patient taking differently: Take 40 mg by mouth 2 (two) times daily. 04/19/23  Yes Leslye Peer, MD  potassium chloride (KLOR-CON M) 10 MEQ tablet Take 10 mEq by mouth daily. Take an extra 20 meq (two tablets) on Fridays (this is the day you take Metolazone)   Yes [provider]  RESTASIS 0.05 % ophthalmic emulsion Place 1 drop into both eyes 2 (two) times daily. 10/13/21  Yes [provider]  sodium bicarbonate 650 MG tablet take ONE tab twice a DAY Patient taking differently: Take 650 mg by mouth 2 (two) times daily. 04/22/23  Yes Worthy Rancher B, FNP  vitamin B-12 (CYANOCOBALAMIN) 1000 MCG tablet Take 1,000 mcg by mouth daily.   Yes [provider]  Vitamin D, Cholecalciferol, 25 MCG (1000 UT) TABS Take 1,000 Units by mouth daily. 05/30/23  Yes Eulis Avilene Marrin, FNP  Spacer/Aero-Holding Chambers (AEROCHAMBER PLUS) inhaler Use as instructed Patient not taking: Reported on 08/08/2023 05/26/16   Domenick Gong, MD    I have reviewed the patient's current medications.  Labs:     Latest Ref Rng & Units 08/08/2023   12:05 AM 05/03/2023    2:59 PM 04/25/2023   12:23 PM  BMP  Glucose 70 - 99 mg/dL 130  865  784   BUN 8 - 23 mg/dL 75  38  46   Creatinine 0.44 - 1.00 mg/dL 6.96  2.95  2.84   Sodium 135 - 145 mmol/L 139  143  135   Potassium 3.5 - 5.1 mmol/L 4.1  4.5  3.8   Chloride 98 - 111 mmol/L 110  109  103   CO2 22 - 32 mmol/L 16  22  24    Calcium 8.9 - 10.3 mg/dL 8.0  9.1  9.1     Urinalysis    Component Value Date/Time   COLORURINE YELLOW 04/25/2023 1221   APPEARANCEUR CLEAR 04/25/2023 1221   LABSPEC 1.010 04/25/2023 1221   PHURINE 6.0  04/25/2023 1221   GLUCOSEU NEGATIVE  04/25/2023 1221   GLUCOSEU NEGATIVE 01/16/2023 1238   HGBUR SMALL (A) 04/25/2023 1221   BILIRUBINUR NEGATIVE 04/25/2023 1221   KETONESUR NEGATIVE 04/25/2023 1221   PROTEINUR >=300 (A) 04/25/2023 1221   UROBILINOGEN 0.2 01/16/2023 1238   NITRITE NEGATIVE 04/25/2023 1221   LEUKOCYTESUR NEGATIVE 04/25/2023 1221     ROS:  Pertinent items are noted in HPI.  Physical Exam: Vitals:   08/08/23 1125 08/08/23 1130  BP: 103/61 (!) 97/54  Pulse:  60  Resp:  (!) 22  Temp:    SpO2:  99%     General: A&O, NAD, lying comfortably in hospital bed HEENT: No sign of trauma, EOM grossly intact, moist mucous membranes Cardiac: RRR, no m/r/g, jugarlar venous pulsations Respiratory: bilateral basal crackles with intermittent wheezes, upper lung fields clear, normal WOB GI: Soft, NTTP, non-distended, no rebound or guarding Extremities: NTTP, 2+ pitting edema to proximal tibia bilaterally Neuro: Moves all four extremities appropriately. Psych: Appropriate mood and affect   Assessment/Plan: Acute on chronic kidney injury - In the acute setting this is likely secondary to cardiorenal syndrome and volume overload, given no clear signs of decreased volume status, or new obstructive cause.  Unfortunately patient may now have secondary contributor due to ATN given acute drop in blood pressure after administration of antihypertensive patient's.  Will follow creatinine closely, but may take significant amount of time to improve.  Of note patient's granddaughter should be get clarified that patient would not want dialysis.  Plan: Strict I/Os Daily weights Avoid nephrotoxic medications Trend RFP 40mg  IV lasix BID Sodium bicarb 1300mg  BID Stop hydral, imdur, doxasozin, imdur, decrease Coreg to 6.25mg  BID Anemia of chronic kidney disease - Last Aranesp dose 06/19/2023. Hgb stable on admission at 10.2. Monitor CBC Acute on chronic heart failure exacerbation - Management  per primary team. Diuresis as above. Acute asthma exacerbation - Management per primary team.    Celine Mans, MD, PGY-2 [facility] 11:43 AM 08/08/2023     Seen and examined independently.  Agree with note and exam as documented above by resident physician and as noted here.  [name] is a pleasant female with a history of CKD stage 5, HTN, and chronic diastolic CHF with a baseline Cr of 4 who presents with wheezing and shortness of breath.  Her family is concerned about her not taking her medicine.  We called her granddaughter who supplements for history, helps manage the patient's medicines, and her daughter and sister are also at bedside.  Her granddaughter reports that the patient sees Dr. [name] at [facility] and they have previously discussed whether she would want dialysis and the patient did not want to pursue dialysis at any time.  Family is letting her make that decision and they are supportive of her.  She does have some memory impairment.  She received her reported home medications in the ER and became hypotensive thus medications were titrated.   General elderly female in bed in no acute distress HEENT normocephalic atraumatic extraocular movements intact sclera anicteric Neck supple trachea midline Lungs crackles bilateral bases normal work of breathing at rest on room air  Heart S1S2 no rub; BP 94/52 Abdomen soft nontender nondistended Extremities 1-2+ bilateral lower extremity edema  Psych normal mood and affect  AKI - pre-renal and ischemic insults   - she has expressed never wanting dialysis and her family is supportive   CKD stage V - Note baseline Cr 4; 04/2023 Cr 3.89 and eGFR 11  - follows  with Dr. [name] - [facility]   Acute on chronic diastolic CHF - lasix 40 mg IV BID is ordered  HTN  - upon resuming reported home regimen she became hypotensive - discontinued amlodipine, hydralazine, imdur, and doxazosin given her blood pressure on  consult of 94/52 - Left coreg at a reduced dose and placed hold parameter to hold for BP under 120 systolic  Metabolic acidosis - on bicarbonate    Anemia CKD  - mild and at goal for CKD   Thank you for the consult.  Please do not hesitate to contact me with any questions.    Estanislado Emms, MD 08/08/2023  7:16 PM

## 2023-08-08 NOTE — Assessment & Plan Note (Signed)
 SBP 195 Continue amlodipine, carvedilol, hydralazine, isosorbide and Cardura

## 2023-08-08 NOTE — H&P (Signed)
 History and Physical    Patient: [name] ZOX:[id number] DOB: 1933-08-28 DOA: 08/07/2023 DOS: the patient was seen and examined on 08/08/2023 PCP: Angela Headings, [name]  Patient coming from: Home  Chief Complaint:  Chief Complaint  Patient presents with   Shortness of Breath    HPI: [name] is a 89 y.o. female with medical history significant for HTN, moderate persistent asthma, CKD stage V, DM type II, HFpEF (G2 DD, EF 60 to 65% 10/2022) and anemia of chronic disease is being admitted with AKI on CKD V, found incidentally during routine workup while being treated for an acute asthma exacerbation.  Patient came in with wheezing typical of asthma exacerbation which improved with treatment in the ED. She has a congested cough but denied chest pain, fever or chills.  .  She has ongoing lower extremity swelling for which she is on diuretics and uses compressive stockings.  She denies leg pain.  Unclear when her last visit to nephrology was, however she did see her cardiologist back in December 2024 when she was fluid overloaded and was on 120 -160 mg Lasix daily.  Additionally she has been taking metolazone on Mondays. ED course and data review: BP 189/80 with otherwise normal vitals Labs notable for creatinine of 5.71 with bicarb of 16(Baseline creatinine 3.89  3 months prior).  Hemoglobin near baseline at 10.2. EKG with sinus rhythm at 82 and nonspecific ST-T wave changes CT chest showing mild changes of pulmonary edema and effusion and patchy airspace opacities within the lower lobes bilaterally    Past Medical History:  Diagnosis Date   Acute superficial venous thrombosis of left lower extremity 03/27/2014   concern about hx and potential extensive on exam tenderness calf and low dose lovenox 40 qd 2-4 weekselevetion and close  fu advised .    Allergy    Anemia    Anxiety    Arthritis    "shoulders" (09/09/2012)   Asthma 09/08/2012   Carotid bruit    [place] 2018  low risk  1 - 39%   Cataract    bil cateracts removed   Chronic kidney disease    Clotting disorder (HCC)    blood clots in legs   Diabetes mellitus without complication (HCC)    "Borderline" per pt; being monitored.  no meds per pt   Diverticulosis    Dyspnea 04/27/2014   GERD (gastroesophageal reflux disease)    Headache(784.0)    "related to my high blood pressure" (09/09/2012)   Hiatal hernia    History of DVT (deep vein thrombosis)    "RLE" (09/09/2012) coumadine cant take asa so on plavix    HTN (hypertension) 09/08/2012   Hyperlipidemia    Lupus    "cured years ago" (09/09/2012)   Other dysphagia 02/11/2013   Syncope and collapse 01/21/2018   Varicose veins    Varicose veins of leg with complications 12/05/2010   Past Surgical History:  Procedure Laterality Date   ABDOMINAL HYSTERECTOMY     partial   BREAST EXCISIONAL BIOPSY Right    BREAST SURGERY     CARPAL TUNNEL RELEASE Right 2000's   CARPAL TUNNEL RELEASE Bilateral 10/21/2013   Procedure: BILATERAL  CARPAL TUNNEL RELEASE;  Surgeon: Angela Reaper, [name];  Location: [facility];  Service: Orthopedics;  Laterality: Bilateral;   COLONOSCOPY     SHOULDER OPEN ROTATOR CUFF REPAIR Bilateral 2000's   TRIGGER FINGER RELEASE Right 10/21/2013   Procedure: RELEASE TRIGGER FINGER/A-1 PULLEY RIGHT MIDDLE  AND RIGHT RING;  Surgeon: Angela Reaper, [name];  Location: [facility];  Service: Orthopedics;  Laterality: Right;   UPPER GASTROINTESTINAL ENDOSCOPY     Social History:  reports that she has never smoked. She has never used smokeless tobacco. She reports that she does not drink alcohol and does not use drugs.  Allergies  Allergen Reactions   Penicillins Nausea And Vomiting and Other (See Comments)    Has patient had a PCN reaction causing immediate rash, facial/tongue/throat swelling, SOB or lightheadedness with hypotension: Y Has patient had a PCN reaction causing severe rash involving mucus membranes or skin  necrosis: Y Has patient had a PCN reaction that required hospitalization: N Has patient had a PCN reaction occurring within the last 10 years: N If all of the above answers are "NO", then may proceed with Cephalosporin use.    Aspirin Other (See Comments)    Wheezing Acetaminophen is OK    Fish Allergy Itching, Nausea And Vomiting and Swelling    Swelling of the face    Family History  Problem Relation Age of Onset   Hypertension Mother    Lung cancer Mother    Hypertension Father    Lung cancer Father    Breast cancer Sister    Colon cancer Sister        ? 42' s dx - died in 25's   Breast cancer Sister    Hypertension Brother    Rectal cancer Brother    Stomach cancer Brother    Breast cancer Brother    Heart attack Daughter    Esophageal cancer Daughter    Hypertension Daughter    Diabetes Daughter    Breast cancer Paternal Aunt    Lung cancer Other        both parents    Pancreatic cancer Neg Hx     Prior to Admission medications   Medication Sig Start Date End Date Taking? Authorizing Provider  acetaminophen (TYLENOL) 500 MG tablet Take 500 mg by mouth 2 (two) times daily as needed for mild pain (pain score 1-3) or headache.   Yes [name]  albuterol (PROVENTIL) (2.5 MG/3ML) 0.083% nebulizer solution Take 3 mLs (2.5 mg total) by nebulization every 6 (six) hours as needed for shortness of breath or wheezing. 08/03/22  Yes Angela Beers, [name]  albuterol (VENTOLIN HFA) 108 (90 Base) MCG/ACT inhaler USE 2 PUFFS EVERY 6 HOURS AS NEEDED FOR WHEEZING. 04/19/23  Yes [name]  Budeson-Glycopyrrol-Formoterol (BREZTRI AEROSPHERE) 160-9-4.8 MCG/ACT AERO INHALE 2 PUFFS INTO THE LUNGS TWICE DAILY, IN THE MORNING & AT BEDTIME. 04/19/23  Yes [name]  hydrALAZINE (APRESOLINE) 100 MG tablet Take 1 tablet (100 mg total) by mouth 3 (three) times daily. 05/21/23  Yes [name]  Spacer/Aero-Holding Chambers (AEROCHAMBER PLUS) inhaler Use  as instructed 05/26/16  Yes Angela Gong, [name]  amLODipine (NORVASC) 10 MG tablet Take 1 tablet (10 mg total) by mouth daily. Patient taking differently: Take 10 mg by mouth at bedtime. 11/07/22   Angela Shipper, [name]  atorvastatin (LIPITOR) 40 MG tablet Take 1 tablet (40 mg total) by mouth daily. Patient taking differently: Take 40 mg by mouth every evening. 04/21/22   [name]  atropine 1 % ophthalmic solution Place 1 drop into both eyes 2 (two) times daily as needed.    [name]  Azelastine-Fluticasone 137-50 MCG/ACT SUSP PLACE ONE SPRAY IN EACH NOSTRIL TWICE DAILY (IN THE MORNING  AND AT BEDTIME) Patient taking differently: Place 1 spray into both nostrils in the morning and at bedtime. 03/12/23   Leslye Peer, [name]  azithromycin (ZITHROMAX) 250 MG tablet Take 2 tablets on day 1, then 1 tablet daily on days 2 through 5 08/07/23 08/12/23  Panosh, Neta Mends, [name]  carvedilol (COREG) 12.5 MG tablet Take 1 tablet (12.5 mg total) by mouth 2 (two) times daily. 04/06/23   [name]  cetirizine (ZYRTEC) 10 MG tablet Take 10 mg by mouth daily as needed for allergies or rhinitis.    [name]  citalopram (CELEXA) 20 MG tablet Take 0.5 tablets (10 mg total) by mouth daily. 08/07/23   Panosh, Neta Mends, [name]  cloNIDine (CATAPRES) 0.1 MG tablet TAKE ONE TABLET BY MOUTH TWICE DAILY AS NEEDED for hypertension 08/07/23   Panosh, Neta Mends, [name]  clopidogrel (PLAVIX) 75 MG tablet Take 1 tablet (75 mg total) by mouth daily. 08/07/23   Panosh, Neta Mends, [name]  diclofenac Sodium (VOLTAREN) 1 % GEL Apply 4 g topically 2 (two) times daily as needed (painful areas). 05/02/22   Ellsworth Lennox, PA-C  doxazosin (CARDURA) 4 MG tablet Take 1 tablet (4 mg total) by mouth 2 (two) times daily. 05/28/23   Sabharwal, Aditya, DO  ferrous sulfate 325 (65 FE) MG EC tablet Take 1 tablet (325 mg total) by mouth daily with breakfast. Patient taking differently: Take 325 mg by mouth. 12/15/14   Panosh, Neta Mends, [name]  fluticasone (FLONASE) 50 MCG/ACT nasal spray Place 2 sprays into both nostrils as needed for allergies or rhinitis.    [name]  folic acid (FOLVITE) 1 MG tablet Take 1 tablet (1 mg total) by mouth daily. 08/07/23   Panosh, Neta Mends, [name]  furosemide (LASIX) 40 MG tablet Take 1.5 tablets (60 mg total) by mouth 2 (two) times daily. 06/19/23 09/17/23  Jacklynn Ganong, [name]  isosorbide mononitrate (IMDUR) 30 MG 24 hr tablet Take 1 tablet (30 mg total) by mouth daily. 04/02/23 07/01/23  Jacklynn Ganong, [name]  LORazepam (ATIVAN) 0.5 MG tablet Take 1 tablet (0.5 mg total) by mouth 2 (two) times daily as needed. for anxiety 05/28/23   Eulis Foster, [name]  metolazone (ZAROXOLYN) 2.5 MG tablet Take 1 tablet (2.5 mg total) by mouth once a week. Take every Friday 02/26/23   Jacklynn Ganong, [name]  montelukast (SINGULAIR) 10 MG tablet Take 1 tablet (10 mg total) by mouth daily. Patient taking differently: Take 10 mg by mouth at bedtime. 04/19/23   Leslye Peer, [name]  oxyCODONE (OXY IR/ROXICODONE) 5 MG immediate release tablet Take 1 tablet (5 mg total) by mouth every 6 (six) hours as needed for severe pain (pain score 7-10). 05/02/23   Panosh, Neta Mends, [name]  pantoprazole (PROTONIX) 40 MG tablet Take 1 tablet (40 mg total) by mouth daily. Patient taking differently: Take 40 mg by mouth 2 (two) times daily. 04/19/23   Leslye Peer, [name]  potassium chloride (KLOR-CON M) 10 MEQ tablet Take 10 mEq by mouth daily. Take an extra 20 meq (two tablets) on Fridays (this is the day you take Metolazone)    [name]  RESTASIS 0.05 % ophthalmic emulsion Place 1 drop into both eyes 2 (two) times daily. 10/13/21   [name]  sodium bicarbonate 650 MG tablet take ONE tab twice a DAY Patient taking differently: Take 650 mg by mouth 2 (two) times daily. 04/22/23   Eulis Foster, [name]  vitamin B-12 (CYANOCOBALAMIN) 1000 MCG tablet Take 1,000 mcg by mouth daily.    Provider, Historical,  [name]  Vitamin D, Cholecalciferol, 25 MCG (1000 UT) TABS Take 1,000 Units by mouth daily. 05/30/23   Worthy Rancher B, [name]  potassium chloride (KLOR-CON) 10 MEQ tablet TAKE FOUR TABLETS BY MOUTH DAILY. Patient taking differently: Take 20 mEq by mouth 2 (two) times daily. 10/19/22 11/06/22  Angela Headings, [name]    Physical Exam: Vitals:   08/08/23 0000 08/08/23 0306 08/08/23 0314 08/08/23 0330  BP:  (!) 197/88  (!) 195/82  Pulse:  83  79  Resp:  20  16  Temp:  98.8 F (37.1 C)    TempSrc:  Oral    SpO2:  100% 99% 100%  Weight: 61.7 kg      Physical Exam Vitals and nursing note reviewed.  Constitutional:      General: She is not in acute distress. HENT:     Head: Normocephalic and atraumatic.  Cardiovascular:     Rate and Rhythm: Normal rate and regular rhythm.     Heart sounds: Normal heart sounds.  Pulmonary:     Effort: Pulmonary effort is normal.     Breath sounds: Normal breath sounds.  Abdominal:     Palpations: Abdomen is soft.     Tenderness: There is no abdominal tenderness.  Musculoskeletal:     Right lower leg: 3+ Edema present.     Left lower leg: 3+ Edema present.  Neurological:     Mental Status: Mental status is at baseline.     Labs on Admission: I have personally reviewed following labs and imaging studies  CBC: Recent Labs  Lab 08/08/23 0005  WBC 7.5  NEUTROABS 5.7  HGB 10.2*  HCT 32.3*  MCV 94.7  PLT 167   Basic Metabolic Panel: Recent Labs  Lab 08/08/23 0005  NA 139  K 4.1  CL 110  CO2 16*  GLUCOSE 100*  BUN 75*  CREATININE 5.71*  CALCIUM 8.0*   GFR: Estimated Creatinine Clearance: 5.7 mL/min (A) (by C-G formula based on SCr of 5.71 mg/dL (H)). Liver Function Tests: No results for input(s): "AST", "ALT", "ALKPHOS", "BILITOT", "PROT", "ALBUMIN" in the last 168 hours. No results for input(s): "LIPASE", "AMYLASE" in the last 168 hours. No results for input(s): "AMMONIA" in the last 168 hours. Coagulation Profile: No results for input(s):  "INR", "PROTIME" in the last 168 hours. Cardiac Enzymes: No results for input(s): "CKTOTAL", "CKMB", "CKMBINDEX", "TROPONINI" in the last 168 hours. BNP (last 3 results) No results for input(s): "PROBNP" in the last 8760 hours. HbA1C: No results for input(s): "HGBA1C" in the last 72 hours. CBG: No results for input(s): "GLUCAP" in the last 168 hours. Lipid Profile: No results for input(s): "CHOL", "HDL", "LDLCALC", "TRIG", "CHOLHDL", "LDLDIRECT" in the last 72 hours. Thyroid Function Tests: No results for input(s): "TSH", "T4TOTAL", "FREET4", "T3FREE", "THYROIDAB" in the last 72 hours. Anemia Panel: No results for input(s): "VITAMINB12", "FOLATE", "FERRITIN", "TIBC", "IRON", "RETICCTPCT" in the last 72 hours. Urine analysis:    Component Value Date/Time   COLORURINE YELLOW 04/25/2023 1221   APPEARANCEUR CLEAR 04/25/2023 1221   LABSPEC 1.010 04/25/2023 1221   PHURINE 6.0 04/25/2023 1221   GLUCOSEU NEGATIVE 04/25/2023 1221   GLUCOSEU NEGATIVE 01/16/2023 1238   HGBUR SMALL (A) 04/25/2023 1221   BILIRUBINUR NEGATIVE 04/25/2023 1221   KETONESUR NEGATIVE 04/25/2023 1221   PROTEINUR >=300 (A) 04/25/2023 1221   UROBILINOGEN 0.2 01/16/2023 1238   NITRITE NEGATIVE 04/25/2023 1221  LEUKOCYTESUR NEGATIVE 04/25/2023 1221    Radiological Exams on Admission: CT Chest Wo Contrast Result Date: 08/08/2023 CLINICAL DATA:  Increasing right paratracheal density on recent chest x-ray EXAM: CT CHEST WITHOUT CONTRAST TECHNIQUE: Multidetector CT imaging of the chest was performed following the standard protocol without IV contrast. RADIATION DOSE REDUCTION: This exam was performed according to the departmental dose-optimization program which includes automated exposure control, adjustment of the mA and/or kV according to patient size and/or use of iterative reconstruction technique. COMPARISON:  Plain film from earlier in the same day FINDINGS: Cardiovascular: Somewhat limited due to lack of IV contrast.  Atherosclerotic calcifications of the thoracic aorta are noted. No aneurysmal dilatation is seen. The heart is mildly enlarged in size. Coronary calcifications are noted. Fullness on recent chest x-ray is related to combination of pulmonary arterial and venous structures on the right and likely some rotation of the patient. Mediastinum/Nodes: Thoracic inlet is within normal limits. No sizable hilar or mediastinal adenopathy is noted. The esophagus as visualized is within normal limits. Lungs/Pleura: Small bilateral pleural effusions are noted. Mild septal thickening is noted consistent with mild edema. Some patchy airspace opacities primarily in the lower lobes bilateral consistent with early infiltrate. This is worst on the right. Upper Abdomen: Visualized upper abdomen is within normal limits. Musculoskeletal: Degenerative changes of the thoracic spine are seen. No acute rib abnormality is noted. IMPRESSION: Right perihilar fullness on recent chest x-ray is related to the pulmonary artery and pulmonary venous structures and patient rotation. No focal mass is noted. Mild changes of pulmonary edema and effusion. Patchy airspace opacities are noted within the lower lobes bilaterally. Aortic Atherosclerosis (ICD10-I70.0). Electronically Signed   By: Alcide Clever M.D.   On: 08/08/2023 03:57   DG Humerus Right Result Date: 08/08/2023 CLINICAL DATA:  Right shoulder and right humerus pain.  No injury. EXAM: RIGHT HUMERUS - 2+ VIEW COMPARISON:  None Available. FINDINGS: Degenerative changes in the glenohumeral and acromioclavicular joints as well as in the elbow joint. No evidence of acute fracture or dislocation. No focal bone lesion or bone destruction. Soft tissues are unremarkable. IMPRESSION: Degenerative changes in the right shoulder and right elbow. No acute bony abnormalities. Electronically Signed   By: Burman Nieves M.D.   On: 08/08/2023 03:15   DG Chest 2 View Result Date: 08/08/2023 CLINICAL DATA:   Shortness of breath beginning today. History of asthma. Right shoulder and right humerus pain. EXAM: CHEST - 2 VIEW COMPARISON:  01/16/2023 FINDINGS: Cardiac enlargement. Mild central vascular congestion. Interstitial changes in the lung bases likely representing edema. Prominent right paratracheal/right hilar shadows increasing since previous study. This could represent central vascularity or lymphadenopathy. CT suggested for further evaluation. No pleural effusion or pneumothorax. Degenerative changes in the spine and shoulders. Calcification of the aorta. IMPRESSION: 1. Cardiac enlargement with pulmonary vascular congestion and mild basilar edema. 2. Increasing right hilar/paratracheal shadow. CT recommended for further evaluation. Electronically Signed   By: Burman Nieves M.D.   On: 08/08/2023 00:33     Data Reviewed: Relevant notes from primary care and specialist visits, past discharge summaries as available in EHR, including Care Everywhere. Prior diagnostic testing as pertinent to current admission diagnoses Updated medications and problem lists for reconciliation ED course, including vitals, labs, imaging, treatment and response to treatment Triage notes, nursing and pharmacy notes and ED provider's notes Notable results as noted in HPI   Assessment and Plan: * Acute kidney injury superimposed on stage 5 chronic kidney disease, not on chronic dialysis (  HCC) Hypertensive nephrosclerosis Metabolic acidosis Possibly related to intensification of outpatient diuretic regimen Nephrology consult in the a.m. Sodium bicarb tablets-will increase home dose from 650 to 1300 mg pending nephrology consult Monitor renal function and avoid nephrotoxins  Acute on chronic heart failure with preserved ejection fraction (HFpEF) (HCC) Hypertensive urgency Suspect cardiorenal syndrome Clinically volume overloaded with 2-3+ pitting edema, lungs clear Currently on Lasix 60 mg twice a day and metolazone  on Mondays Low-dose IV Lasix Continue carvedilol, isosorbide Daily weights Last echo was June 2024 with G2 DD and EF 60 to 65% Consider cardiology consult  Hypertensive urgency SBP 195 Continue amlodipine, carvedilol, hydralazine, isosorbide and Cardura  Acute exacerbation of moderate persistent extrinsic asthma Possible pneumonia Patient presents with wheezing and a congested cough, normal vitals and WBC but chest CT showing bilateral lower lobe airspace disease Wheezing improved with DuoNeb and prednisone in the ED Continue DuoNeb every 6 as needed Empiric Rocephin and azithromycin pending procalcitonin Antitussives Incentive spirometer  Anemia of chronic kidney failure, stage 5 (HCC) Will get anemia panel     DVT prophylaxis: Lovenox  Consults: none  Advance Care Planning:   Code Status: Prior   Family Communication: none  Disposition Plan: Back to previous home environment  Severity of Illness: The appropriate patient status for this patient is OBSERVATION. Observation status is judged to be reasonable and necessary in order to provide the required intensity of service to ensure the patient's safety. The patient's presenting symptoms, physical exam findings, and initial radiographic and laboratory data in the context of their medical condition is felt to place them at decreased risk for further clinical deterioration. Furthermore, it is anticipated that the patient will be medically stable for discharge from the hospital within 2 midnights of admission.   Author: Andris Baumann, [name] 08/08/2023 4:17 AM  For on call review [email or web address].

## 2023-08-08 NOTE — Progress Notes (Signed)
 Heart Failure Navigator Progress Note  Assessed for Heart & Vascular TOC clinic readiness.  Patient does not meet criteria due to Advanced Heart Failure Team patient of Dr. Shirlee Latch.   Navigator will sign off at this time.    Rhae Hammock, BSN, Scientist, clinical (histocompatibility and immunogenetics) Only

## 2023-08-08 NOTE — Progress Notes (Signed)
 Brief same day note:  Patient is a 89 year old female with history of hypertension, moderate persistent asthma, CKD stage V with baseline creatinine around 4, diabetes type 2, HFpEF, anemia of chronic disease who presented to the emergency department with complaint of shortness of breath, wheezing, cough.  Also reported bilateral lower extremity swelling, uses compression stockings.  Takes Lasix at home.On presentation,she was hypertensive.  Lab work showed creatinine of 5.7, creatinine was 3.89 three months ago.  Chest x-ray showed mild changes of pulmonary edema, effusion, patchy airspace opacities within the bilateral lower lobes.  Patient admitted for the management of acute asthma exacerbation, AKI on CKD.  Nephrology consulted  Patient seen and examined at bedside this morning.  Daughter at bedside today.  Appears comfortable.  Alert and oriented.  On room air.  Denies any worsening shortness of breath or cough.  Has severe bilateral lower extremity edema   Assessment and plan:  AKI on CKD stage V: Follows with nephrology, takes Lasix at home.  Baseline creatinine around 4, presented with creatinine in the range of 5.7.  Will consult nephrology  Acute exacerbation of moderate persistent asthma: Presented with wheezing, shortness of breath, cough.  No leukocytosis or fever Continue steroids, bronchodilators, incentive spirometer, cough medications  Possible community-acquired pneumonia:.  Chest CT showed bilateral lower lobe airspace disease.  Started on ceftriaxone, azithromycin for possible concurrent community-acquired pneumonia.   Severe hypertension: On amlodipine, carvedilol, hydralazine, isosorbide, Cardura.  Continue to monitor.  Continue these medications.  Continue prn medications for severe hypertension  Acute on chronic HFpEF: Presented with volume overload.  Had bilateral lower EXTR edema.  Started on IV Lasix.  Takes Lasix 60 mg twice daily, metolazone at home.  Last echo on June  24 showed EF of 60 to 65%, grade 2 diastolic dysfunction.  Anemia of chronic disease: Currently hemoglobin stable  Plan of care discussed with daugter at bedside

## 2023-08-08 NOTE — Assessment & Plan Note (Addendum)
 Hypertensive urgency Suspect cardiorenal syndrome Clinically volume overloaded with 2-3+ pitting edema, lungs clear Currently on Lasix 60 mg twice a day and metolazone on Mondays Low-dose IV Lasix Continue carvedilol, isosorbide Daily weights Last echo was June 2024 with G2 DD and EF 60 to 65% Consider cardiology consult

## 2023-08-09 ENCOUNTER — Inpatient Hospital Stay (HOSPITAL_COMMUNITY)

## 2023-08-09 DIAGNOSIS — I5033 Acute on chronic diastolic (congestive) heart failure: Secondary | ICD-10-CM

## 2023-08-09 DIAGNOSIS — E872 Acidosis, unspecified: Secondary | ICD-10-CM | POA: Diagnosis not present

## 2023-08-09 DIAGNOSIS — J4541 Moderate persistent asthma with (acute) exacerbation: Secondary | ICD-10-CM

## 2023-08-09 DIAGNOSIS — N179 Acute kidney failure, unspecified: Secondary | ICD-10-CM | POA: Diagnosis not present

## 2023-08-09 LAB — CBC WITH DIFFERENTIAL/PLATELET
Abs Immature Granulocytes: 0.11 10*3/uL — ABNORMAL HIGH (ref 0.00–0.07)
Basophils Absolute: 0 10*3/uL (ref 0.0–0.1)
Basophils Relative: 0 %
Eosinophils Absolute: 0 10*3/uL (ref 0.0–0.5)
Eosinophils Relative: 0 %
HCT: 26.3 % — ABNORMAL LOW (ref 36.0–46.0)
Hemoglobin: 8.6 g/dL — ABNORMAL LOW (ref 12.0–15.0)
Immature Granulocytes: 2 %
Lymphocytes Relative: 15 %
Lymphs Abs: 1 10*3/uL (ref 0.7–4.0)
MCH: 30.2 pg (ref 26.0–34.0)
MCHC: 32.7 g/dL (ref 30.0–36.0)
MCV: 92.3 fL (ref 80.0–100.0)
Monocytes Absolute: 0.4 10*3/uL (ref 0.1–1.0)
Monocytes Relative: 6 %
Neutro Abs: 5.2 10*3/uL (ref 1.7–7.7)
Neutrophils Relative %: 77 %
Platelets: 155 10*3/uL (ref 150–400)
RBC: 2.85 MIL/uL — ABNORMAL LOW (ref 3.87–5.11)
RDW: 15.2 % (ref 11.5–15.5)
WBC: 6.7 10*3/uL (ref 4.0–10.5)
nRBC: 0 % (ref 0.0–0.2)

## 2023-08-09 LAB — BASIC METABOLIC PANEL WITH GFR
Anion gap: 11 (ref 5–15)
BUN: 76 mg/dL — ABNORMAL HIGH (ref 8–23)
CO2: 18 mmol/L — ABNORMAL LOW (ref 22–32)
Calcium: 7.9 mg/dL — ABNORMAL LOW (ref 8.9–10.3)
Chloride: 111 mmol/L (ref 98–111)
Creatinine, Ser: 5.77 mg/dL — ABNORMAL HIGH (ref 0.44–1.00)
GFR, Estimated: 7 mL/min — ABNORMAL LOW (ref >60)
Glucose, Bld: 105 mg/dL — ABNORMAL HIGH (ref 70–99)
Potassium: 4.3 mmol/L (ref 3.5–5.1)
Sodium: 140 mmol/L (ref 135–145)

## 2023-08-09 LAB — MRSA NEXT GEN BY PCR, NASAL: MRSA by PCR Next Gen: NOT DETECTED

## 2023-08-09 LAB — MAGNESIUM: Magnesium: 1.7 mg/dL (ref 1.7–2.4)

## 2023-08-09 LAB — PROCALCITONIN: Procalcitonin: 0.17 ng/mL

## 2023-08-09 MED ORDER — MAGNESIUM SULFATE 2 GM/50ML IV SOLN
2.0000 g | Freq: Once | INTRAVENOUS | Status: AC
  Administered 2023-08-09: 2 g via INTRAVENOUS
  Filled 2023-08-09: qty 50

## 2023-08-09 NOTE — Progress Notes (Signed)
 PROGRESS NOTE        PATIENT DETAILS Name: [name] Age: 89 y.o. Sex: female Date of Birth: [birth year] Admit Date: 08/07/2023 Admitting Physician Burnadette Pop, MD ZOX:[id number], Neta Mends, MD  Brief Summary: Patient is a 89 y.o.  female with history of HTN, DM-2, HFpEF, CKD 5, normocytic anemia-who presented with shortness of breath-she was found to have AKI on CKD 5 along with HFpEF exacerbation.  She was subsequently admitted to the hospitalist service.  Significant events: 3/25>> admit to [facility]  Significant studies: 3/26>> CT chest: Right perihilar fullness related to pulmonary artery/pulmonary venous structures-no mass-patchy airspace disease bilaterally. 3/26>> Echo: EF 35-40%, grade 3 diastolic dysfunction.  Significant microbiology data: None  Procedures: None  Consults: Nephrology  Subjective: Lying comfortably in bed-denies any chest pain or shortness of breath.  Feels that her shortness of breath is much better.  Objective: Vitals: Blood pressure (!) 155/68, pulse 72, temperature 97.6 F (36.4 C), temperature source Oral, resp. rate 19, weight 59.9 kg, SpO2 96%.   Exam: Gen Exam:Alert awake-not in any distress HEENT:atraumatic, normocephalic Chest: Few bibasilar rales. CVS:S1S2 regular Abdomen:soft non tender, non distended Extremities:+ edema Neurology: Non focal Skin: no rash  Pertinent Labs/Radiology:    Latest Ref Rng & Units 08/09/2023    4:52 AM 08/08/2023   12:05 AM 07/17/2023    1:36 PM  CBC  WBC 4.0 - 10.5 K/uL 6.7  7.5  3.2   Hemoglobin 12.0 - 15.0 g/dL 8.6  40.9  81.1   Hematocrit 36.0 - 46.0 % 26.3  32.3  35.2   Platelets 150 - 400 K/uL 155  167  213     Lab Results  Component Value Date   NA 140 08/09/2023   K 4.3 08/09/2023   CL 111 08/09/2023   CO2 18 (L) 08/09/2023      Assessment/Plan: AKI on CKD 5 Metabolic acidosis secondary to worsening renal function. AKI likely hemodynamically  mediated Remains slightly volume overloaded Remains on IV Lasix Remains on oral bicarb. Strict I/O's/daily weights/follow electrolytes Avoid nephrotoxic agents Patient does not desire HD.  Acute on chronic combined systolic/diastolic heart failure Slightly volume overloaded IV Lasix Follow weights/electrolytes/intake/output Echo-with new-reduced EF-do not think she will be a candidate for invasive care including LHC given advanced CKD-plan is to treat medically.  Continue beta-blocker-depending on how her blood pressure is over the next few days-we can add hydralazine/nitrates.  Asthma exacerbation versus cardiac asthma Continue bronchodilators-short course of steroids IV diuretics-see above.  ?  PNA No clinical symptomatology suggestive of pneumonia-suspect changes in CT-more consistent with CHF. Stop antibiotics and monitor.  Normocytic anemia Secondary to EKG/AKI Follow CBC  HTN BP currently stable with just Coreg/diuretics Although home medication list Imdur/hydralazine/amlodipine/clonidine/Cardura-Per prior notes-she has been noncompliant  HLD Statin  Palliative care Unfortunately-now with advanced renal failure-does not desire HD-also with new diagnosis of HFrEF.  Not a candidate for any aggressive care-plan is to manage medically as much as we can.  Discussed with daughter Ms. Woodside over the phone-she is agreeable with medical management-and is agreeable for goals of care conversation with the palliative care team.  Probably will benefit from initiation of hospice/palliative care on discharge.  BMI: Estimated body mass index is 23.39 kg/m as calculated from the following:   Height as of an earlier encounter on 08/07/23: 5\' 3"  (1.6 m).   Weight  as of this encounter: 59.9 kg.   Code status:   Code Status: Full Code   DVT Prophylaxis: heparin injection 5,000 Units Start: 08/08/23 1400   Family Communication: Adeola Dennen [phone] updated over  the phone.   Disposition Plan: Status is: Inpatient Remains inpatient appropriate because: Severity of illness   Planned Discharge Destination:Home health   Diet: Diet Order             Diet Heart Room service appropriate? Yes; Fluid consistency: Thin  Diet effective now                     Antimicrobial agents: Anti-infectives (From admission, onward)    Start     Dose/Rate Route Frequency Ordered Stop   08/08/23 0545  cefTRIAXone (ROCEPHIN) 1 g in sodium chloride 0.9 % 100 mL IVPB        1 g 200 mL/hr over 30 Minutes Intravenous Every 24 hours 08/08/23 0533     08/08/23 0545  azithromycin (ZITHROMAX) 500 mg in sodium chloride 0.9 % 250 mL IVPB        500 mg 250 mL/hr over 60 Minutes Intravenous Every 24 hours 08/08/23 0533          MEDICATIONS: Scheduled Meds:  atorvastatin  40 mg Oral Daily   carvedilol  6.25 mg Oral BID WC   citalopram  10 mg Oral Daily   fluticasone furoate-vilanterol  1 puff Inhalation Daily   And   umeclidinium bromide  1 puff Inhalation Daily   folic acid  1 mg Oral Daily   furosemide  40 mg Intravenous Q12H   guaiFENesin  600 mg Oral BID   heparin  5,000 Units Subcutaneous Q8H   ipratropium-albuterol  3 mL Nebulization Once   montelukast  10 mg Oral Daily   pantoprazole  40 mg Oral Daily   predniSONE  40 mg Oral Q breakfast   sodium bicarbonate  1,300 mg Oral BID   Continuous Infusions:  azithromycin 500 mg (08/09/23 0904)   cefTRIAXone (ROCEPHIN)  IV 1 g (08/09/23 0627)   PRN Meds:.acetaminophen **OR** acetaminophen, albuterol, HYDROcodone-acetaminophen, labetalol, LORazepam, ondansetron **OR** ondansetron (ZOFRAN) IV, oxyCODONE   I have personally reviewed following labs and imaging studies  LABORATORY DATA: CBC: Recent Labs  Lab 08/08/23 0005 08/09/23 0452  WBC 7.5 6.7  NEUTROABS 5.7 5.2  HGB 10.2* 8.6*  HCT 32.3* 26.3*  MCV 94.7 92.3  PLT 167 155    Basic Metabolic Panel: Recent Labs  Lab 08/08/23 0005  08/09/23 0452  NA 139 140  K 4.1 4.3  CL 110 111  CO2 16* 18*  GLUCOSE 100* 105*  BUN 75* 76*  CREATININE 5.71* 5.77*  CALCIUM 8.0* 7.9*  MG  --  1.7    GFR: Estimated Creatinine Clearance: 5.7 mL/min (A) (by C-G formula based on SCr of 5.77 mg/dL (H)).  Liver Function Tests: No results for input(s): "AST", "ALT", "ALKPHOS", "BILITOT", "PROT", "ALBUMIN" in the last 168 hours. No results for input(s): "LIPASE", "AMYLASE" in the last 168 hours. No results for input(s): "AMMONIA" in the last 168 hours.  Coagulation Profile: No results for input(s): "INR", "PROTIME" in the last 168 hours.  Cardiac Enzymes: No results for input(s): "CKTOTAL", "CKMB", "CKMBINDEX", "TROPONINI" in the last 168 hours.  BNP (last 3 results) No results for input(s): "PROBNP" in the last 8760 hours.  Lipid Profile: No results for input(s): "CHOL", "HDL", "LDLCALC", "TRIG", "CHOLHDL", "LDLDIRECT" in the last 72 hours.  Thyroid Function Tests: No  results for input(s): "TSH", "T4TOTAL", "FREET4", "T3FREE", "THYROIDAB" in the last 72 hours.  Anemia Panel: No results for input(s): "VITAMINB12", "FOLATE", "FERRITIN", "TIBC", "IRON", "RETICCTPCT" in the last 72 hours.  Urine analysis:    Component Value Date/Time   COLORURINE YELLOW 04/25/2023 1221   APPEARANCEUR CLEAR 04/25/2023 1221   LABSPEC 1.010 04/25/2023 1221   PHURINE 6.0 04/25/2023 1221   GLUCOSEU NEGATIVE 04/25/2023 1221   GLUCOSEU NEGATIVE 01/16/2023 1238   HGBUR SMALL (A) 04/25/2023 1221   BILIRUBINUR NEGATIVE 04/25/2023 1221   KETONESUR NEGATIVE 04/25/2023 1221   PROTEINUR >=300 (A) 04/25/2023 1221   UROBILINOGEN 0.2 01/16/2023 1238   NITRITE NEGATIVE 04/25/2023 1221   LEUKOCYTESUR NEGATIVE 04/25/2023 1221    Sepsis Labs: Lactic Acid, Venous    Component Value Date/Time   LATICACIDVEN 0.8 10/31/2022 0715    MICROBIOLOGY: No results found for this or any previous visit (from the past 240 hours).  RADIOLOGY  STUDIES/RESULTS: ECHOCARDIOGRAM COMPLETE Result Date: 08/08/2023    ECHOCARDIOGRAM REPORT   Patient Name:   MARLITA KEIL [facility] Date of Exam: 08/08/2023 Medical Rec #:  [id number]          Height:       63.0 in Accession #:    [id number]         Weight:       136.0 lb Date of Birth:  07/07/1933           BSA:          1.641 m Patient Age:    89 years           BP:           95/65 mmHg Patient Gender: F                  HR:           59 bpm. Exam Location:  Inpatient Procedure: 2D Echo, Cardiac Doppler and Color Doppler (Both Spectral and Color            Flow Doppler were utilized during procedure). Indications:    I50.40* Unspecified combined systolic (congestive) and diastolic                 (congestive) heart failure  History:        Patient has prior history of Echocardiogram examinations, most                 recent 10/31/2022. Abnormal ECG, Pulmonary HTN,                 Signs/Symptoms:Edema, Shortness of Breath and Dyspnea; Risk                 Factors:Hypertension and Dyslipidemia. CKD.  Sonographer:    Sheralyn Boatman RDCS Referring Phys: [id number] Andris Baumann  Sonographer Comments: Study delayed for renal consult. IMPRESSIONS  1. Left ventricular ejection fraction, by estimation, is 35 to 40%. The left ventricle has moderately decreased function. The left ventricle demonstrates global hypokinesis. There is mild left ventricular hypertrophy. Left ventricular diastolic parameters are consistent with Grade III diastolic dysfunction (restrictive).  2. Right ventricular systolic function is normal. The right ventricular size is mildly enlarged. There is moderately elevated pulmonary artery systolic pressure. The estimated right ventricular systolic pressure is 47.3 mmHg.  3. The mitral valve is normal in structure. No evidence of mitral valve regurgitation. No evidence of mitral stenosis.  4. The aortic valve is normal in structure. Aortic valve regurgitation is not visualized. No  aortic stenosis is present.  5. The  inferior vena cava is normal in size with greater than 50% respiratory variability, suggesting right atrial pressure of 3 mmHg. Comparison(s): Prior images reviewed side by side. The left ventricular function is worsened. FINDINGS  Left Ventricle: Left ventricular ejection fraction, by estimation, is 35 to 40%. The left ventricle has moderately decreased function. The left ventricle demonstrates global hypokinesis. The left ventricular internal cavity size was normal in size. There is mild left ventricular hypertrophy. Left ventricular diastolic parameters are consistent with Grade III diastolic dysfunction (restrictive). Right Ventricle: The right ventricular size is mildly enlarged. No increase in right ventricular wall thickness. Right ventricular systolic function is normal. There is moderately elevated pulmonary artery systolic pressure. The tricuspid regurgitant velocity is 2.84 m/s, and with an assumed right atrial pressure of 15 mmHg, the estimated right ventricular systolic pressure is 47.3 mmHg. Left Atrium: Left atrial size was normal in size. Right Atrium: Right atrial size was normal in size. Pericardium: There is no evidence of pericardial effusion. Mitral Valve: The mitral valve is normal in structure. No evidence of mitral valve regurgitation. No evidence of mitral valve stenosis. Tricuspid Valve: The tricuspid valve is normal in structure. Tricuspid valve regurgitation is mild . No evidence of tricuspid stenosis. Aortic Valve: The aortic valve is normal in structure. Aortic valve regurgitation is not visualized. No aortic stenosis is present. Pulmonic Valve: The pulmonic valve was normal in structure. Pulmonic valve regurgitation is not visualized. No evidence of pulmonic stenosis. Aorta: The aortic root is normal in size and structure. Venous: The inferior vena cava is normal in size with greater than 50% respiratory variability, suggesting right atrial pressure of 3 mmHg. IAS/Shunts: No atrial  level shunt detected by color flow Doppler.  LEFT VENTRICLE PLAX 2D LVIDd:         4.50 cm      Diastology LVIDs:         3.50 cm      LV e' medial:    3.37 cm/s LV PW:         1.30 cm      LV E/e' medial:  25.0 LV IVS:        1.10 cm      LV e' lateral:   5.27 cm/s LVOT diam:     2.00 cm      LV E/e' lateral: 16.0 LV SV:         59 LV SV Index:   36 LVOT Area:     3.14 cm  LV Volumes (MOD) LV vol d, MOD A2C: 101.0 ml LV vol d, MOD A4C: 100.0 ml LV vol s, MOD A2C: 64.1 ml LV vol s, MOD A4C: 63.8 ml LV SV MOD A2C:     36.9 ml LV SV MOD A4C:     100.0 ml LV SV MOD BP:      38.1 ml RIGHT VENTRICLE            IVC RV S prime:     9.00 cm/s  IVC diam: 2.30 cm TAPSE (M-mode): 1.4 cm LEFT ATRIUM             Index        RIGHT ATRIUM           Index LA diam:        4.20 cm 2.56 cm/m   RA Area:     16.20 cm LA Vol (A2C):   69.6 ml 42.41 ml/m  RA Volume:  39.90 ml  24.31 ml/m LA Vol (A4C):   51.8 ml 31.56 ml/m LA Biplane Vol: 59.7 ml 36.37 ml/m  AORTIC VALVE LVOT Vmax:   79.70 cm/s LVOT Vmean:  53.500 cm/s LVOT VTI:    0.189 m  AORTA Ao Root diam: 2.90 cm Ao Asc diam:  2.60 cm MITRAL VALVE               TRICUSPID VALVE MV Area (PHT): 3.08 cm    TR Peak grad:   32.3 mmHg MV Decel Time: 246 msec    TR Vmax:        284.00 cm/s MV E velocity: 84.40 cm/s MV A velocity: 70.20 cm/s  SHUNTS MV E/A ratio:  1.20        Systemic VTI:  0.19 m                            Systemic Diam: 2.00 cm Donato Schultz MD Electronically signed by Donato Schultz MD Signature Date/Time: 08/08/2023/3:10:02 PM    Final    CT Chest Wo Contrast Result Date: 08/08/2023 CLINICAL DATA:  Increasing right paratracheal density on recent chest x-ray EXAM: CT CHEST WITHOUT CONTRAST TECHNIQUE: Multidetector CT imaging of the chest was performed following the standard protocol without IV contrast. RADIATION DOSE REDUCTION: This exam was performed according to the departmental dose-optimization program which includes automated exposure control, adjustment of the  mA and/or kV according to patient size and/or use of iterative reconstruction technique. COMPARISON:  Plain film from earlier in the same day FINDINGS: Cardiovascular: Somewhat limited due to lack of IV contrast. Atherosclerotic calcifications of the thoracic aorta are noted. No aneurysmal dilatation is seen. The heart is mildly enlarged in size. Coronary calcifications are noted. Fullness on recent chest x-ray is related to combination of pulmonary arterial and venous structures on the right and likely some rotation of the patient. Mediastinum/Nodes: Thoracic inlet is within normal limits. No sizable hilar or mediastinal adenopathy is noted. The esophagus as visualized is within normal limits. Lungs/Pleura: Small bilateral pleural effusions are noted. Mild septal thickening is noted consistent with mild edema. Some patchy airspace opacities primarily in the lower lobes bilateral consistent with early infiltrate. This is worst on the right. Upper Abdomen: Visualized upper abdomen is within normal limits. Musculoskeletal: Degenerative changes of the thoracic spine are seen. No acute rib abnormality is noted. IMPRESSION: Right perihilar fullness on recent chest x-ray is related to the pulmonary artery and pulmonary venous structures and patient rotation. No focal mass is noted. Mild changes of pulmonary edema and effusion. Patchy airspace opacities are noted within the lower lobes bilaterally. Aortic Atherosclerosis (ICD10-I70.0). Electronically Signed   By: Alcide Clever M.D.   On: 08/08/2023 03:57   DG Humerus Right Result Date: 08/08/2023 CLINICAL DATA:  Right shoulder and right humerus pain.  No injury. EXAM: RIGHT HUMERUS - 2+ VIEW COMPARISON:  None Available. FINDINGS: Degenerative changes in the glenohumeral and acromioclavicular joints as well as in the elbow joint. No evidence of acute fracture or dislocation. No focal bone lesion or bone destruction. Soft tissues are unremarkable. IMPRESSION: Degenerative  changes in the right shoulder and right elbow. No acute bony abnormalities. Electronically Signed   By: Burman Nieves M.D.   On: 08/08/2023 03:15   DG Chest 2 View Result Date: 08/08/2023 CLINICAL DATA:  Shortness of breath beginning today. History of asthma. Right shoulder and right humerus pain. EXAM: CHEST - 2 VIEW COMPARISON:  01/16/2023  FINDINGS: Cardiac enlargement. Mild central vascular congestion. Interstitial changes in the lung bases likely representing edema. Prominent right paratracheal/right hilar shadows increasing since previous study. This could represent central vascularity or lymphadenopathy. CT suggested for further evaluation. No pleural effusion or pneumothorax. Degenerative changes in the spine and shoulders. Calcification of the aorta. IMPRESSION: 1. Cardiac enlargement with pulmonary vascular congestion and mild basilar edema. 2. Increasing right hilar/paratracheal shadow. CT recommended for further evaluation. Electronically Signed   By: Burman Nieves M.D.   On: 08/08/2023 00:33     LOS: 1 day   Jeoffrey Massed, MD  [facility]    To contact the attending provider between 7A-7P or the covering provider during after hours 7P-7A, please log into the web site [email or web address] and access using universal [place] password for that web site. If you do not have the password, please call the hospital operator.  08/09/2023, 10:05 AM

## 2023-08-09 NOTE — Evaluation (Signed)
 Clinical/Bedside Swallow Evaluation Patient Details  Name: [name] MRN: [id number] Date of Birth: [birth year]  Today's Date: 08/09/2023 Time: SLP Start Time (ACUTE ONLY): 1014 SLP Stop Time (ACUTE ONLY): 1034 SLP Time Calculation (min) (ACUTE ONLY): 20 min  Past Medical History:  Past Medical History:  Diagnosis Date   Acute superficial venous thrombosis of left lower extremity 03/27/2014   concern about hx and potential extensive on exam tenderness calf and low dose lovenox 40 qd 2-4 weekselevetion and close  fu advised .    Allergy    Anemia    Anxiety    Arthritis    "shoulders" (09/09/2012)   Asthma 09/08/2012   Carotid bruit    [place] 2018  low risk 1 - 39%   Cataract    bil cateracts removed   Chronic kidney disease    Clotting disorder (HCC)    blood clots in legs   Diabetes mellitus without complication (HCC)    "Borderline" per pt; being monitored.  no meds per pt   Diverticulosis    Dyspnea 04/27/2014   GERD (gastroesophageal reflux disease)    Headache(784.0)    "related to my high blood pressure" (09/09/2012)   Hiatal hernia    History of DVT (deep vein thrombosis)    "RLE" (09/09/2012) coumadine cant take asa so on plavix    HTN (hypertension) 09/08/2012   Hyperlipidemia    Lupus    "cured years ago" (09/09/2012)   Other dysphagia 02/11/2013   Syncope and collapse 01/21/2018   Varicose veins    Varicose veins of leg with complications 12/05/2010   Past Surgical History:  Past Surgical History:  Procedure Laterality Date   ABDOMINAL HYSTERECTOMY     partial   BREAST EXCISIONAL BIOPSY Right    BREAST SURGERY     CARPAL TUNNEL RELEASE Right 2000's   CARPAL TUNNEL RELEASE Bilateral 10/21/2013   Procedure: BILATERAL  CARPAL TUNNEL RELEASE;  Surgeon: Nicki Reaper, MD;  Location: [facility];  Service: Orthopedics;  Laterality: Bilateral;   COLONOSCOPY     SHOULDER OPEN ROTATOR CUFF REPAIR Bilateral 2000's   TRIGGER FINGER RELEASE Right  10/21/2013   Procedure: RELEASE TRIGGER FINGER/A-1 PULLEY RIGHT MIDDLE AND RIGHT RING;  Surgeon: Nicki Reaper, MD;  Location: [facility];  Service: Orthopedics;  Laterality: Right;   UPPER GASTROINTESTINAL ENDOSCOPY     HPI:  [name] is an 89 yo female presenting to ED 3/25 with shortness of breath, congested cough, and progressive LE edema. Admitted with AKI on CKD V. CT Chest shows mild changes of pulmonary edema and effusion and bilateral lower lobe patchy airspace opacities (R>L). Per MD note, pt displays no clinical symptomology suggestive of PNA and changes noted on CT may be more consistent with CHF. Esophagram 2011 shows functional esophageal motility but with a small hiatal hernia and associated Schatzki ring. PMH includes HTN, moderate persistent asthma, CKD V, T2DM, HFpEF, anemia of chronic disease    Assessment / Plan / Recommendation  Clinical Impression  Pt reports a history of reflux that she states is managed well with medication. She has upper and lower dentures, which are always in place when eating. Observed pt take multiple pills with thin liquids in addition to subsequent sequential sips via straw without overt s/s of aspiration. Mastication was mildly prolonged with solids, but did not result in any subjective reports of shortness of breath or change in vital signs. Provided education re: esophageal precautions and discussed  option for diet modification with pt stating she would prefer to continue regular solids. Continue current diet without further SLP f/u. Will sign off at this time. SLP Visit Diagnosis: Dysphagia, unspecified (R13.10)    Aspiration Risk  Mild aspiration risk    Diet Recommendation Regular;Thin liquid    Liquid Administration via: Cup;Straw Medication Administration: Whole meds with liquid Supervision: Staff to assist with self feeding Compensations: Slow rate;Small sips/bites Postural Changes: Seated upright at 90 degrees;Remain  upright for at least 30 minutes after po intake    Other  Recommendations Oral Care Recommendations: Oral care BID    Recommendations for follow up therapy are one component of a multi-disciplinary discharge planning process, led by the attending physician.  Recommendations may be updated based on patient status, additional functional criteria and insurance authorization.  Follow up Recommendations No SLP follow up      Assistance Recommended at Discharge    Functional Status Assessment Patient has not had a recent decline in their functional status  Frequency and Duration            Prognosis Prognosis for improved oropharyngeal function: Good Barriers to Reach Goals: Time post onset      Swallow Study   General HPI: [name] is an 89 yo female presenting to ED 3/25 with shortness of breath, congested cough, and progressive LE edema. Admitted with AKI on CKD V. CT Chest shows mild changes of pulmonary edema and effusion and bilateral lower lobe patchy airspace opacities (R>L). Esophagram 2011 shows functional esophageal motility but with a small hiatal hernia and associated Schatzki ring. PMH includes HTN, moderate persistent asthma, CKD V, T2DM, HFpEF, anemia of chronic disease Type of Study: Bedside Swallow Evaluation Previous Swallow Assessment: none in chart Diet Prior to this Study: Regular;Thin liquids (Level 0) Temperature Spikes Noted: No Respiratory Status: Room air History of Recent Intubation: No Behavior/Cognition: Alert;Cooperative;Pleasant mood Oral Cavity Assessment: Within Functional Limits Oral Care Completed by SLP: No Oral Cavity - Dentition: Dentures, top;Dentures, bottom Vision: Functional for self-feeding Self-Feeding Abilities: Able to feed self Patient Positioning: Upright in bed Baseline Vocal Quality: Normal Volitional Cough: Congested Volitional Swallow: Able to elicit    Oral/Motor/Sensory Function Overall Oral Motor/Sensory Function:  Within functional limits   Ice Chips Ice chips: Not tested   Thin Liquid Thin Liquid: Within functional limits Presentation: Straw;Self Fed    Nectar Thick Nectar Thick Liquid: Not tested   Honey Thick Honey Thick Liquid: Not tested   Puree Puree: Not tested   Solid     Solid: Impaired Presentation: Self Fed Oral Phase Functional Implications: Prolonged oral transit      [name], M.A., CF-SLP Speech Language Pathology, Acute Rehabilitation Services  Secure Chat preferred [phone]  08/09/2023,11:11 AM

## 2023-08-09 NOTE — Progress Notes (Addendum)
 [facility] Progress Note  Name: [name] MRN: [id number] DOB: May 04, 1934  Chief Complaint:  SOB  Subjective:  NAEO. Bps improved with holding meds. Patient reports she does not feel like she has urinated that much. However, does feel that her swelling has improved somewhat, as well as her breathing.    No intake or output data in the 24 hours ending 08/09/23 1012  Vitals:  Vitals:   08/08/23 2327 08/09/23 0513 08/09/23 0752 08/09/23 0821  BP: (!) 145/70 (!) 146/69 (!) 155/68   Pulse: 66 72    Resp: 17 12 19    Temp: (!) 97.5 F (36.4 C) 98.6 F (37 C) 97.6 F (36.4 C)   TempSrc: Oral Oral Oral   SpO2: 95% 96% 95% 96%  Weight:         Physical Exam:  General: NAD  Neuro: A&O Cardiovascular: RRR, no murmurs, 1+ peripheral edema bilaterally Respiratory: normal WOB on RA, CTAB, no wheezes, improved basilar crackles Abdomen: soft, NTTP, no rebound or guarding Extremities: Moving all 4 extremities equally   Medications reviewed   Labs:     Latest Ref Rng & Units 08/09/2023    4:52 AM 08/08/2023   12:05 AM 05/03/2023    2:59 PM  BMP  Glucose 70 - 99 mg/dL 952  841  324   BUN 8 - 23 mg/dL 76  75  38   Creatinine 0.44 - 1.00 mg/dL 4.01  0.27  2.53   Sodium 135 - 145 mmol/L 140  139  143   Potassium 3.5 - 5.1 mmol/L 4.3  4.1  4.5   Chloride 98 - 111 mmol/L 111  110  109   CO2 22 - 32 mmol/L 18  16  22    Calcium 8.9 - 10.3 mg/dL 7.9  8.0  9.1      Assessment/Plan:  Acute on chronic kidney injury - Prerenal and now ischemic secondary to hypotension yesterday. Creatinine and electrolytes remain stable. Exam today improved from volume perspective, unfortunately no documented I/O. Continue lasix 40mg  BID Strict I/O Trend RFP Avoid nephrotoxic medications Continue Sodium bicarb Acute on chronic HF exacerbation Diuresis as above Per primary Anemia of chronic disease - Hemoglobin decreased today to 8.6. AM CBC HTN Hold Hydralazine, Imdur,  Doxasozin, continue reduced dose Coreg  Celine Mans, MD, PGY-2 [facility] 10:12 AM 08/09/2023    Seen and examined independently.  Agree with note and exam as documented above by resident physician Dr. Velna Ochs and as noted here.  Breathing is much better today.  I spoke with her granddaughter via phone to update her.  All questions answered.    General elderly female in bed in no acute distress HEENT normocephalic atraumatic extraocular movements intact sclera anicteric Neck supple trachea midline Lungs crackles bilateral bases normal work of breathing at rest on room air  Heart S1S2 no rub Abdomen soft nontender nondistended Extremities 1-2+ bilateral lower extremity edema  Psych normal mood and affect   AKI - pre-renal and ischemic insults   - she has expressed never wanting dialysis and her family is supportive  - primary team is reaching out to palliative care which is reasonable    CKD stage V - Note baseline Cr 4; for reference, 04/2023 Cr 3.89 and eGFR 11  - follows with Dr. [name] - [facility]    Acute on chronic diastolic CHF - continue lasix 40 mg IV BID today and may be able to transition to orals tomorrow  HTN  - upon resuming reported home regimen she became hypotensive.  Suggests noncompliance  - discontinued amlodipine, hydralazine, imdur, and doxazosin given her blood pressure on consult of 94/52 - Left coreg at a reduced dose and placed hold parameter to hold for BP under 120 systolic   Metabolic acidosis - on bicarbonate     Anemia CKD  - iron is replete  - may need ESA if persistent   Estanislado Emms, MD 08/09/2023  12:00 PM

## 2023-08-09 NOTE — Progress Notes (Addendum)
 Physical Therapy Evaluation Patient Details Name: [name] MRN: [id number] DOB: [birth year] Today's Date: 08/09/2023  History of Present Illness  89 y.o. female presents to [facility] 08/07/23 with acute asthma exacerbation and LE edema. CT chest showed mild pulmonary edema, effusion, and patchy airspace opacities within lower lobes bilaterally. Possible PNA, AKI superimposed on CKD V, acute on chronic CHF. PMH: HTN, HLD, asthma, CKD stage V, DM2, CKD, lupus, GERD.   Clinical Impression  Pt in bed upon arrival and agreeable to PT eval. PTA, pt was ModI with a SP cane for short distances and would use a rollator in the community. In today's session, pt was able to perform bed mobility with supervision and stand with RW and CGA for safety. Pt was able to ambulate ~20 ft with RW and cues for obstacle negotiation. Pt reports being at functional mobility baseline. If needed, pt can have 24/7 physical assist upon d/c from the hospital. Pt currently with functional limitations due to the deficits listed below (see PT Problem List). Pt would benefit from acute skilled PT to address functional impairments. Anticipating pt will have no post-acute PT needs as pt is at mobility baseline. Acute PT to follow.         If plan is discharge home, recommend the following: A little help with walking and/or transfers;A little help with bathing/dressing/bathroom;Assist for transportation;Help with stairs or ramp for entrance;Assistance with cooking/housework   Can travel by private vehicle    Yes    Equipment Recommendations None recommended by PT     Functional Status Assessment Patient has had a recent decline in their functional status and demonstrates the ability to make significant improvements in function in a reasonable and predictable amount of time.     Precautions / Restrictions Precautions Precautions: Fall Precaution/Restrictions Comments: compression stocking Restrictions Weight Bearing  Restrictions Per Provider Order: No      Mobility  Bed Mobility Overal bed mobility: Needs Assistance Bed Mobility: Supine to Sit    Supine to sit: Supervision    General bed mobility comments: supervision for safety, cues for shift hips forwards to get feet flat on the ground    Transfers Overall transfer level: Needs assistance Equipment used: Rolling walker (2 wheels) Transfers: Sit to/from Stand Sit to Stand: Supervision      General transfer comment: cues for hand placement, supervision for safety with RW    Ambulation/Gait Ambulation/Gait assistance: Contact guard assist Gait Distance (Feet): 20 Feet Assistive device: Rolling walker (2 wheels) Gait Pattern/deviations: Step-through pattern, Trunk flexed, Narrow base of support Gait velocity: decr     General Gait Details: slow and steady gait with decreased step length. Has difficulty navigating obstacles in the room with cues for safety     Balance Overall balance assessment: Needs assistance, Mild deficits observed, not formally tested Sitting-balance support: No upper extremity supported, Feet supported Sitting balance-Leahy Scale: Good     Standing balance support: Bilateral upper extremity supported, During functional activity, Reliant on assistive device for balance Standing balance-Leahy Scale: Poor Standing balance comment: reliant on RW       Pertinent Vitals/Pain Pain Assessment Pain Assessment: Faces Faces Pain Scale: Hurts a little bit Pain Location: generalized Pain Descriptors / Indicators: Aching, Discomfort Pain Intervention(s): Monitored during session, Limited activity within patient's tolerance, Repositioned    Home Living Family/patient expects to be discharged to:: Private residence Living Arrangements: Other relatives;Children Available Help at Discharge: Family;Available 24 hours/day Type of Home: House Home Access: Level entry  Home Layout: One level Home Equipment:  Cane - single point;Educational psychologist (4 wheels)      Prior Function Prior Level of Function : Independent/Modified Independent;Driving    Mobility Comments: SP cane in the home, rollator in the community ADLs Comments: drives, grocery shops, goes to church. 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 and sometimes able to manage     Extremity/Trunk Assessment   Upper Extremity Assessment Upper Extremity Assessment: Defer to OT evaluation    Lower Extremity Assessment Lower Extremity Assessment: Overall WFL for tasks assessed    Cervical / Trunk Assessment Cervical / Trunk Assessment: Normal  Communication   Communication Communication: No apparent difficulties    Cognition Arousal: Alert Behavior During Therapy: WFL for tasks assessed/performed   PT - Cognitive impairments: No family/caregiver present to determine baseline, Memory, Problem solving, Safety/Judgement    PT - Cognition Comments: A&Ox4, would repeat earlier conversations during the session. Required cues for safety and problem solving with safe mobility Following commands: Impaired Following commands impaired: Follows one step commands with increased time, Only follows one step commands consistently     Cueing Cueing Techniques: Verbal cues      PT Assessment Patient needs continued PT services  PT Problem List Decreased activity tolerance;Decreased balance;Decreased mobility;Decreased strength;Decreased safety awareness       PT Treatment Interventions DME instruction;Gait training;Functional mobility training;Therapeutic activities;Therapeutic exercise;Balance training;Neuromuscular re-education;Patient/family education    PT Goals (Current goals can be found in the Care Plan section)  Acute Rehab PT Goals Patient Stated Goal: to go home PT Goal Formulation: With patient Time For Goal Achievement: 08/23/23 Potential to Achieve Goals: Good    Frequency Min 1X/week        AM-PAC  PT "6 Clicks" Mobility  Outcome Measure Help needed turning from your back to your side while in a flat bed without using bedrails?: A Little Help needed moving from lying on your back to sitting on the side of a flat bed without using bedrails?: A Little Help needed moving to and from a bed to a chair (including a wheelchair)?: A Little Help needed standing up from a chair using your arms (e.g., wheelchair or bedside chair)?: A Little Help needed to walk in hospital room?: A Little Help needed climbing 3-5 steps with a railing? : A Little 6 Click Score: 18    End of Session Equipment Utilized During Treatment: Gait belt Activity Tolerance: Patient tolerated treatment well Patient left: in chair;with call bell/phone within reach;with chair alarm set Nurse Communication: Mobility status PT Visit Diagnosis: Other abnormalities of gait and mobility (R26.89)    Time: 0981-1914 PT Time Calculation (min) (ACUTE ONLY): 23 min   Charges:   PT Evaluation $PT Eval Low Complexity: 1 Low   PT General Charges $$ ACUTE PT VISIT: 1 Visit    Hilton Cork, PT, DPT Secure Chat Preferred  Rehab Office [phone]   Arturo Morton Brion Aliment 08/09/2023, 1:58 PM

## 2023-08-10 DIAGNOSIS — E872 Acidosis, unspecified: Secondary | ICD-10-CM | POA: Diagnosis not present

## 2023-08-10 DIAGNOSIS — I5033 Acute on chronic diastolic (congestive) heart failure: Secondary | ICD-10-CM | POA: Diagnosis not present

## 2023-08-10 DIAGNOSIS — N179 Acute kidney failure, unspecified: Secondary | ICD-10-CM | POA: Diagnosis not present

## 2023-08-10 DIAGNOSIS — J4541 Moderate persistent asthma with (acute) exacerbation: Secondary | ICD-10-CM | POA: Diagnosis not present

## 2023-08-10 LAB — RENAL FUNCTION PANEL
Albumin: 2.3 g/dL — ABNORMAL LOW (ref 3.5–5.0)
Anion gap: 13 (ref 5–15)
BUN: 77 mg/dL — ABNORMAL HIGH (ref 8–23)
CO2: 20 mmol/L — ABNORMAL LOW (ref 22–32)
Calcium: 8.2 mg/dL — ABNORMAL LOW (ref 8.9–10.3)
Chloride: 107 mmol/L (ref 98–111)
Creatinine, Ser: 5.66 mg/dL — ABNORMAL HIGH (ref 0.44–1.00)
GFR, Estimated: 7 mL/min — ABNORMAL LOW (ref >60)
Glucose, Bld: 109 mg/dL — ABNORMAL HIGH (ref 70–99)
Phosphorus: 6.6 mg/dL — ABNORMAL HIGH (ref 2.5–4.6)
Potassium: 4.1 mmol/L (ref 3.5–5.1)
Sodium: 140 mmol/L (ref 135–145)

## 2023-08-10 LAB — CBC
HCT: 28.3 % — ABNORMAL LOW (ref 36.0–46.0)
Hemoglobin: 9.2 g/dL — ABNORMAL LOW (ref 12.0–15.0)
MCH: 29.6 pg (ref 26.0–34.0)
MCHC: 32.5 g/dL (ref 30.0–36.0)
MCV: 91 fL (ref 80.0–100.0)
Platelets: 189 10*3/uL (ref 150–400)
RBC: 3.11 MIL/uL — ABNORMAL LOW (ref 3.87–5.11)
RDW: 15.1 % (ref 11.5–15.5)
WBC: 7.1 10*3/uL (ref 4.0–10.5)
nRBC: 0 % (ref 0.0–0.2)

## 2023-08-10 LAB — MAGNESIUM: Magnesium: 2.3 mg/dL (ref 1.7–2.4)

## 2023-08-10 MED ORDER — HYDRALAZINE HCL 20 MG/ML IJ SOLN
20.0000 mg | Freq: Four times a day (QID) | INTRAMUSCULAR | Status: DC | PRN
  Administered 2023-08-10 – 2023-08-12 (×4): 20 mg via INTRAVENOUS
  Filled 2023-08-10 (×4): qty 1

## 2023-08-10 MED ORDER — FUROSEMIDE 40 MG PO TABS: 40.0000 mg | ORAL_TABLET | Freq: Two times a day (BID) | ORAL | Status: DC

## 2023-08-10 MED ORDER — HYDRALAZINE HCL 25 MG PO TABS
25.0000 mg | ORAL_TABLET | Freq: Two times a day (BID) | ORAL | Status: DC
  Administered 2023-08-10 (×2): 25 mg via ORAL
  Filled 2023-08-10 (×2): qty 1

## 2023-08-10 MED ORDER — FUROSEMIDE 40 MG PO TABS
60.0000 mg | ORAL_TABLET | Freq: Two times a day (BID) | ORAL | Status: DC
  Administered 2023-08-10 – 2023-08-11 (×3): 60 mg via ORAL
  Filled 2023-08-10 (×3): qty 1

## 2023-08-10 MED ORDER — ISOSORBIDE MONONITRATE ER 30 MG PO TB24
15.0000 mg | ORAL_TABLET | Freq: Every day | ORAL | Status: DC
  Administered 2023-08-10 – 2023-08-11 (×2): 15 mg via ORAL
  Filled 2023-08-10 (×2): qty 1

## 2023-08-10 MED ORDER — GUAIFENESIN-DM 100-10 MG/5ML PO SYRP
5.0000 mL | ORAL_SOLUTION | ORAL | Status: DC | PRN
  Administered 2023-08-10 – 2023-08-11 (×2): 5 mL via ORAL
  Filled 2023-08-10 (×3): qty 5

## 2023-08-10 NOTE — Plan of Care (Signed)
     Referral received for [name]: goals of care discussion. Chart reviewed. Patient would like her daughter and granddaughter to be present when discussing goals of care.  Attempted to contact [name]. Unable to reach. Voicemail left with contact information given. I was able to speak with [name]. She will call with time for GOC meeting.  Detailed note and recommendations to follow once GOC has been completed.   Thank you for your referral and allowing PMT to assist in [place] Glaus's care.   Sarina Ser, NP Palliative Medicine Team  Team Phone # [phone]   NO CHARGE

## 2023-08-10 NOTE — Progress Notes (Signed)
 [facility] NEPHROLOGY PROGRESS NOTE  Assessment/ Plan: Pt is a 89 y.o. yo female with history of HTN, T2DM, HLD, GERD, Lupus, Asthma, CKD5 who presented to the ED due to shortness of breath.    # AKI on CKD V: b/l cr around 4, follows with Dr. [name] at [facility] -  AKI due to pre-renal and ischemic insults   - she has expressed never wanting dialysis and her family is supportive.  Palliative care team is following. -Creatinine level is around 5.6, seems to be new baseline.  No urgent indication for dialysis.  Switching from IV to oral diuretics.  Follow-up outpatient.   #Acute on chronic diastolic CHF -Treating with IV diuretics with clinical improvement.  She is on room air.  Switching to oral furosemide.     #HTN  -Continue current medications and volume management with diuretics.   #Metabolic acidosis - on bicarbonate     #Anemia CKD  - iron is replete   Discussed with the patient sister and niece at the bedside. Plan as outlined above.  She will need outpatient follow-up, sign off.  Call us back with question.  Subjective: Seen and examined at the bedside.  She is on room air.  Sitting on chair.  Denies nausea, vomiting, chest pain, shortness of breath.  Family member were presented with her. Objective Vital signs in last 24 hours: Vitals:   08/10/23 0334 08/10/23 0336 08/10/23 0520 08/10/23 0800  BP: (!) 182/81 (!) 190/76 (!) 155/63   Pulse: 83 81 88   Resp: 18 15 16 16   Temp: 97.7 F (36.5 C)   98 F (36.7 C)  TempSrc: Oral   Oral  SpO2: 96% 95% 97%   Weight:      Height:       Weight change:  No intake or output data in the 24 hours ending 08/10/23 1420     Labs: RENAL PANEL Recent Labs  Lab 08/08/23 0005 08/09/23 0452 08/10/23 0541  NA 139 140 140  K 4.1 4.3 4.1  CL 110 111 107  CO2 16* 18* 20*  GLUCOSE 100* 105* 109*  BUN 75* 76* 77*  CREATININE 5.71* 5.77* 5.66*  CALCIUM 8.0* 7.9* 8.2*  MG  --  1.7 2.3  PHOS  --   --  6.6*  ALBUMIN   --   --  2.3*    Liver Function Tests: Recent Labs  Lab 08/10/23 0541  ALBUMIN 2.3*   No results for input(s): "LIPASE", "AMYLASE" in the last 168 hours. No results for input(s): "AMMONIA" in the last 168 hours. CBC: Recent Labs    11/03/22 0544 11/04/22 0549 01/02/23 1239 01/16/23 1238 03/27/23 1243 03/27/23 1244 06/19/23 1327 06/27/23 1458 07/17/23 1336 08/08/23 0005 08/09/23 0452 08/10/23 0541  HGB  --    < > 10.4*   < >  --    < > 8.5*  --  11.1* 10.2* 8.6* 9.2*  MCV  --    < > 96.0   < >  --    < > 95.2  --  94.4 94.7 92.3 91.0  VITAMINB12 1,306*  --   --   --   --   --   --  >2,000*  --   --   --   --   FOLATE 5.1*  --   --   --   --   --   --   --   --   --   --   --  FERRITIN 248  --  217  --  212  --  306  --  351*  337*  --   --   --   TIBC 139*  --   --   --   --   --   --   --  165*  --   --   --   IRON 47  --   --   --   --   --   --   --  47  --   --   --   RETICCTPCT 1.4  --   --   --   --   --   --   --   --   --   --   --    < > = values in this interval not displayed.    Cardiac Enzymes: No results for input(s): "CKTOTAL", "CKMB", "CKMBINDEX", "TROPONINI" in the last 168 hours. CBG: No results for input(s): "GLUCAP" in the last 168 hours.  Iron Studies: No results for input(s): "IRON", "TIBC", "TRANSFERRIN", "FERRITIN" in the last 72 hours. Studies/Results: DG Chest Port 1 View Result Date: 08/09/2023 CLINICAL DATA:  Shortness of breath. EXAM: PORTABLE CHEST 1 VIEW COMPARISON:  Chest radiograph and CT dated 08/08/2023. FINDINGS: Cardiomegaly with vascular congestion and edema similar or slightly progressed since the prior radiograph. Small bilateral pleural effusions. No pneumothorax. No acute osseous pathology. IMPRESSION: Cardiomegaly with vascular congestion and edema. Electronically Signed   By: Elgie Collard M.D.   On: 08/09/2023 10:11    Medications: Infusions:   Scheduled Medications:  atorvastatin  40 mg Oral Daily   carvedilol  6.25  mg Oral BID WC   citalopram  10 mg Oral Daily   fluticasone furoate-vilanterol  1 puff Inhalation Daily   And   umeclidinium bromide  1 puff Inhalation Daily   folic acid  1 mg Oral Daily   furosemide  40 mg Intravenous Q12H   guaiFENesin  600 mg Oral BID   heparin  5,000 Units Subcutaneous Q8H   hydrALAZINE  25 mg Oral Q12H   ipratropium-albuterol  3 mL Nebulization Once   isosorbide mononitrate  15 mg Oral Daily   montelukast  10 mg Oral Daily   pantoprazole  40 mg Oral Daily   predniSONE  40 mg Oral Q breakfast   sodium bicarbonate  1,300 mg Oral BID    have reviewed scheduled and prn medications.  Physical Exam: General:NAD, comfortable Heart:RRR, s1s2 nl Lungs:clear b/l, no crackle Abdomen:soft, Non-tender, non-distended Extremities: Bilateral LE edema+ Neurology: Alert, awake and following command  Edith Groleau Prasad Khyleigh Furney 08/10/2023,2:20 PM  LOS: 2 days

## 2023-08-10 NOTE — TOC Transition Note (Signed)
 Transition of Care [facility]) - Discharge Note   Patient Details  Name: [name] MRN: [id number] Date of Birth: [birth year]  Transition of Care [facility] CM/SW Contact:  Alesia Richards, RN 08/10/2023, 11:17 AM   Clinical Narrative:     CM discussed case with Dr. Jerral Ralph regarding discharge care planning.  Hospital day 3 with diagnosis of Acute Kidney Injury superimposed on stage 5 chronic kidney disease, not on chronic dialysis. Noted, PT recommends no PT follow up. Noted, SLP recommends, no Speech Therapy follow up needed. Estimated date of discharge is 08/12/2023. Discharge plan, home with family/caregiver support.  Final next level of care: Home w Home Health Services Barriers to Discharge: Continued Medical Work up   Patient Goals and CMS Choice Patient states their goals for this hospitalization and ongoing recovery are:: to work on breathing and her kidneys       Discharge Placement       Home with family/caregiver support          Discharge Plan and Services Additional resources added to the After Visit Summary for     Discharge Planning Services: CM Consult               Social Drivers of Health (SDOH) Interventions SDOH Screenings   Food Insecurity: Food Insecurity Present (08/08/2023)  Housing: Low Risk  (08/08/2023)  Transportation Needs: No Transportation Needs (08/08/2023)  Utilities: Not At Risk (08/08/2023)  Depression (PHQ2-9): Low Risk  (01/16/2023)  Financial Resource Strain: Low Risk  (04/14/2022)  Social Connections: Moderately Isolated (08/08/2023)  Tobacco Use: Low Risk  (08/08/2023)     Readmission Risk Interventions    08/08/2023    3:21 PM 11/02/2022   12:32 PM  Readmission Risk Prevention Plan  Transportation Screening  Complete  Medication Review (RN Care Manager) Complete Complete  PCP or Specialist appointment within 3-5 days of discharge Complete Complete  HRI or Home Care Consult Complete Complete  SW Recovery Care/Counseling Consult   Complete  Palliative Care Screening  Not Applicable  Skilled Nursing Facility Complete Not Applicable

## 2023-08-10 NOTE — Progress Notes (Signed)
 PROGRESS NOTE        PATIENT DETAILS Name: [name] Age: 89 y.o. Sex: female Date of Birth: [birth year] Admit Date: 08/07/2023 Admitting Physician Burnadette Pop, MD JWJ:XBJYNW, Neta Mends, MD  Brief Summary: Patient is a 89 y.o.  female with history of HTN, DM-2, HFpEF, CKD 5, normocytic anemia-who presented with shortness of breath-she was found to have AKI on CKD 5 along with HFpEF exacerbation.  She was subsequently admitted to the hospitalist service.  Significant events: 3/25>> admit to [facility]  Significant studies: 3/26>> CT chest: Right perihilar fullness related to pulmonary artery/pulmonary venous structures-no mass-patchy airspace disease bilaterally. 3/26>> Echo: EF 35-40%, grade 3 diastolic dysfunction.  Significant microbiology data: None  Procedures: None  Consults: Nephrology  Subjective: Some cough-still claims she is "wheezing".  No other complaints.  Objective: Vitals: Blood pressure (!) 155/63, pulse 88, temperature 98 F (36.7 C), temperature source Oral, resp. rate 16, height 5\' 3"  (1.6 m), weight 59.9 kg, SpO2 97%.   Exam: Gen Exam:Alert awake-not in any distress HEENT:atraumatic, normocephalic Chest: Some scattered rhonchi-some bibasilar rales. CVS:S1S2 regular Abdomen:soft non tender, non distended Extremities: Trace edema Neurology: Non focal Skin: no rash  Pertinent Labs/Radiology:    Latest Ref Rng & Units 08/10/2023    5:41 AM 08/09/2023    4:52 AM 08/08/2023   12:05 AM  CBC  WBC 4.0 - 10.5 K/uL 7.1  6.7  7.5   Hemoglobin 12.0 - 15.0 g/dL 9.2  8.6  29.5   Hematocrit 36.0 - 46.0 % 28.3  26.3  32.3   Platelets 150 - 400 K/uL 189  155  167     Lab Results  Component Value Date   NA 140 08/10/2023   K 4.1 08/10/2023   CL 107 08/10/2023   CO2 20 (L) 08/10/2023      Assessment/Plan: AKI on CKD 5 Metabolic acidosis secondary to worsening renal function. AKI likely hemodynamically mediated Volume  status stable but still with some peripheral edema Renal function slightly better Continue IV Lasix Continue oral bicarb Patient not desire HD-Per prior wishes-family is respecting patient's wishes. Nephrology following  Acute on chronic combined systolic/diastolic heart failure Slightly volume overloaded IV Lasix Follow weights/electrolytes/intake/output Echo-with new-reduced EF-do not think she will be a candidate for invasive care including LHC given advanced CKD-plan is to treat medically.  Continue beta-blocker Blood pressure significantly elevated yesterday/overnight-will add low-dose nitrate/hydralazine and slowly optimize over the next several days.  Asthma exacerbation versus cardiac asthma Continue bronchodilators-short course of steroids IV diuretics-see above.  ?  PNA No clinical symptomatology suggestive of pneumonia-suspect changes in CT-more consistent with CHF. No longer antibiotics since 3/27-continue to monitor closely.  Normocytic anemia Secondary to CKD Follow CBC  HTN BP significantly elevated yesterday Continue Coreg/diuretic Will add low-dose Imdur/hydralazine today. Follow/adjust.  HLD Statin  Palliative care Unfortunately-now with advanced renal failure-does not desire HD-also with new diagnosis of HFrEF.  Not a candidate for any aggressive care-plan is to manage medically as much as we can.   Discussed with daughter over the phone on 3/27-she is agreeable with medical management-and is agreeable for goals of care conversation with the palliative care team.  Probably will benefit from initiation of hospice/palliative care on discharge.  BMI: Estimated body mass index is 23.39 kg/m as calculated from the following:   Height as of this encounter: 5\' 3"  (1.6  m).   Weight as of this encounter: 59.9 kg.   Code status:   Code Status: Full Code   DVT Prophylaxis: heparin injection 5,000 Units Start: 08/08/23 1400   Family Communication:  Omer Monter [phone] updated over the phone on 3/27.   Disposition Plan: Status is: Inpatient Remains inpatient appropriate because: Severity of illness   Planned Discharge Destination:Home health   Diet: Diet Order             Diet Heart Room service appropriate? Yes; Fluid consistency: Thin  Diet effective now                     Antimicrobial agents: Anti-infectives (From admission, onward)    Start     Dose/Rate Route Frequency Ordered Stop   08/08/23 0545  cefTRIAXone (ROCEPHIN) 1 g in sodium chloride 0.9 % 100 mL IVPB  Status:  Discontinued        1 g 200 mL/hr over 30 Minutes Intravenous Every 24 hours 08/08/23 0533 08/09/23 1024   08/08/23 0545  azithromycin (ZITHROMAX) 500 mg in sodium chloride 0.9 % 250 mL IVPB  Status:  Discontinued        500 mg 250 mL/hr over 60 Minutes Intravenous Every 24 hours 08/08/23 0533 08/09/23 1024        MEDICATIONS: Scheduled Meds:  atorvastatin  40 mg Oral Daily   carvedilol  6.25 mg Oral BID WC   citalopram  10 mg Oral Daily   fluticasone furoate-vilanterol  1 puff Inhalation Daily   And   umeclidinium bromide  1 puff Inhalation Daily   folic acid  1 mg Oral Daily   furosemide  40 mg Intravenous Q12H   guaiFENesin  600 mg Oral BID   heparin  5,000 Units Subcutaneous Q8H   hydrALAZINE  25 mg Oral Q12H   ipratropium-albuterol  3 mL Nebulization Once   isosorbide mononitrate  15 mg Oral Daily   montelukast  10 mg Oral Daily   pantoprazole  40 mg Oral Daily   predniSONE  40 mg Oral Q breakfast   sodium bicarbonate  1,300 mg Oral BID   Continuous Infusions:   PRN Meds:.acetaminophen **OR** acetaminophen, albuterol, guaiFENesin-dextromethorphan, hydrALAZINE, HYDROcodone-acetaminophen, LORazepam, ondansetron **OR** ondansetron (ZOFRAN) IV, oxyCODONE   I have personally reviewed following labs and imaging studies  LABORATORY DATA: CBC: Recent Labs  Lab 08/08/23 0005 08/09/23 0452  08/10/23 0541  WBC 7.5 6.7 7.1  NEUTROABS 5.7 5.2  --   HGB 10.2* 8.6* 9.2*  HCT 32.3* 26.3* 28.3*  MCV 94.7 92.3 91.0  PLT 167 155 189    Basic Metabolic Panel: Recent Labs  Lab 08/08/23 0005 08/09/23 0452 08/10/23 0541  NA 139 140 140  K 4.1 4.3 4.1  CL 110 111 107  CO2 16* 18* 20*  GLUCOSE 100* 105* 109*  BUN 75* 76* 77*  CREATININE 5.71* 5.77* 5.66*  CALCIUM 8.0* 7.9* 8.2*  MG  --  1.7 2.3  PHOS  --   --  6.6*    GFR: Estimated Creatinine Clearance: 5.8 mL/min (A) (by C-G formula based on SCr of 5.66 mg/dL (H)).  Liver Function Tests: Recent Labs  Lab 08/10/23 0541  ALBUMIN 2.3*   No results for input(s): "LIPASE", "AMYLASE" in the last 168 hours. No results for input(s): "AMMONIA" in the last 168 hours.  Coagulation Profile: No results for input(s): "INR", "PROTIME" in the last 168 hours.  Cardiac Enzymes: No results for input(s): "CKTOTAL", "CKMB", "CKMBINDEX", "TROPONINI" in  the last 168 hours.  BNP (last 3 results) No results for input(s): "PROBNP" in the last 8760 hours.  Lipid Profile: No results for input(s): "CHOL", "HDL", "LDLCALC", "TRIG", "CHOLHDL", "LDLDIRECT" in the last 72 hours.  Thyroid Function Tests: No results for input(s): "TSH", "T4TOTAL", "FREET4", "T3FREE", "THYROIDAB" in the last 72 hours.  Anemia Panel: No results for input(s): "VITAMINB12", "FOLATE", "FERRITIN", "TIBC", "IRON", "RETICCTPCT" in the last 72 hours.  Urine analysis:    Component Value Date/Time   COLORURINE YELLOW 04/25/2023 1221   APPEARANCEUR CLEAR 04/25/2023 1221   LABSPEC 1.010 04/25/2023 1221   PHURINE 6.0 04/25/2023 1221   GLUCOSEU NEGATIVE 04/25/2023 1221   GLUCOSEU NEGATIVE 01/16/2023 1238   HGBUR SMALL (A) 04/25/2023 1221   BILIRUBINUR NEGATIVE 04/25/2023 1221   KETONESUR NEGATIVE 04/25/2023 1221   PROTEINUR >=300 (A) 04/25/2023 1221   UROBILINOGEN 0.2 01/16/2023 1238   NITRITE NEGATIVE 04/25/2023 1221   LEUKOCYTESUR NEGATIVE 04/25/2023 1221     Sepsis Labs: Lactic Acid, Venous    Component Value Date/Time   LATICACIDVEN 0.8 10/31/2022 0715    MICROBIOLOGY: Recent Results (from the past 240 hours)  MRSA Next Gen by PCR, Nasal     Status: None   Collection Time: 08/09/23  5:29 AM   Specimen: Nasal Mucosa; Nasal Swab  Result Value Ref Range Status   MRSA by PCR Next Gen NOT DETECTED NOT DETECTED Final    Comment: (NOTE) The GeneXpert MRSA Assay (FDA approved for NASAL specimens only), is one component of a comprehensive MRSA colonization surveillance program. It is not intended to diagnose MRSA infection nor to guide or monitor treatment for MRSA infections. Test performance is not FDA approved in patients less than 64 years old. Performed at [facility], [address] [place], [place] [address]     RADIOLOGY STUDIES/RESULTS: DG Chest Port 1 View Result Date: 08/09/2023 CLINICAL DATA:  Shortness of breath. EXAM: PORTABLE CHEST 1 VIEW COMPARISON:  Chest radiograph and CT dated 08/08/2023. FINDINGS: Cardiomegaly with vascular congestion and edema similar or slightly progressed since the prior radiograph. Small bilateral pleural effusions. No pneumothorax. No acute osseous pathology. IMPRESSION: Cardiomegaly with vascular congestion and edema. Electronically Signed   By: Elgie Collard M.D.   On: 08/09/2023 10:11   ECHOCARDIOGRAM COMPLETE Result Date: 08/08/2023    ECHOCARDIOGRAM REPORT   Patient Name:   WANNA GULLY [facility] Date of Exam: 08/08/2023 Medical Rec #:  [id number]          Height:       63.0 in Accession #:    [id number]         Weight:       136.0 lb Date of Birth:  Sep 24, 1933           BSA:          1.641 m Patient Age:    89 years           BP:           95/65 mmHg Patient Gender: F                  HR:           59 bpm. Exam Location:  Inpatient Procedure: 2D Echo, Cardiac Doppler and Color Doppler (Both Spectral and Color            Flow Doppler were utilized during procedure). Indications:    I50.40*  Unspecified combined systolic (congestive) and diastolic                 (  congestive) heart failure  History:        Patient has prior history of Echocardiogram examinations, most                 recent 10/31/2022. Abnormal ECG, Pulmonary HTN,                 Signs/Symptoms:Edema, Shortness of Breath and Dyspnea; Risk                 Factors:Hypertension and Dyslipidemia. CKD.  Sonographer:    Sheralyn Boatman RDCS Referring Phys: [id number] Andris Baumann  Sonographer Comments: Study delayed for renal consult. IMPRESSIONS  1. Left ventricular ejection fraction, by estimation, is 35 to 40%. The left ventricle has moderately decreased function. The left ventricle demonstrates global hypokinesis. There is mild left ventricular hypertrophy. Left ventricular diastolic parameters are consistent with Grade III diastolic dysfunction (restrictive).  2. Right ventricular systolic function is normal. The right ventricular size is mildly enlarged. There is moderately elevated pulmonary artery systolic pressure. The estimated right ventricular systolic pressure is 47.3 mmHg.  3. The mitral valve is normal in structure. No evidence of mitral valve regurgitation. No evidence of mitral stenosis.  4. The aortic valve is normal in structure. Aortic valve regurgitation is not visualized. No aortic stenosis is present.  5. The inferior vena cava is normal in size with greater than 50% respiratory variability, suggesting right atrial pressure of 3 mmHg. Comparison(s): Prior images reviewed side by side. The left ventricular function is worsened. FINDINGS  Left Ventricle: Left ventricular ejection fraction, by estimation, is 35 to 40%. The left ventricle has moderately decreased function. The left ventricle demonstrates global hypokinesis. The left ventricular internal cavity size was normal in size. There is mild left ventricular hypertrophy. Left ventricular diastolic parameters are consistent with Grade III diastolic dysfunction (restrictive).  Right Ventricle: The right ventricular size is mildly enlarged. No increase in right ventricular wall thickness. Right ventricular systolic function is normal. There is moderately elevated pulmonary artery systolic pressure. The tricuspid regurgitant velocity is 2.84 m/s, and with an assumed right atrial pressure of 15 mmHg, the estimated right ventricular systolic pressure is 47.3 mmHg. Left Atrium: Left atrial size was normal in size. Right Atrium: Right atrial size was normal in size. Pericardium: There is no evidence of pericardial effusion. Mitral Valve: The mitral valve is normal in structure. No evidence of mitral valve regurgitation. No evidence of mitral valve stenosis. Tricuspid Valve: The tricuspid valve is normal in structure. Tricuspid valve regurgitation is mild . No evidence of tricuspid stenosis. Aortic Valve: The aortic valve is normal in structure. Aortic valve regurgitation is not visualized. No aortic stenosis is present. Pulmonic Valve: The pulmonic valve was normal in structure. Pulmonic valve regurgitation is not visualized. No evidence of pulmonic stenosis. Aorta: The aortic root is normal in size and structure. Venous: The inferior vena cava is normal in size with greater than 50% respiratory variability, suggesting right atrial pressure of 3 mmHg. IAS/Shunts: No atrial level shunt detected by color flow Doppler.  LEFT VENTRICLE PLAX 2D LVIDd:         4.50 cm      Diastology LVIDs:         3.50 cm      LV e' medial:    3.37 cm/s LV PW:         1.30 cm      LV E/e' medial:  25.0 LV IVS:        1.10 cm  LV e' lateral:   5.27 cm/s LVOT diam:     2.00 cm      LV E/e' lateral: 16.0 LV SV:         59 LV SV Index:   36 LVOT Area:     3.14 cm  LV Volumes (MOD) LV vol d, MOD A2C: 101.0 ml LV vol d, MOD A4C: 100.0 ml LV vol s, MOD A2C: 64.1 ml LV vol s, MOD A4C: 63.8 ml LV SV MOD A2C:     36.9 ml LV SV MOD A4C:     100.0 ml LV SV MOD BP:      38.1 ml RIGHT VENTRICLE            IVC RV S prime:      9.00 cm/s  IVC diam: 2.30 cm TAPSE (M-mode): 1.4 cm LEFT ATRIUM             Index        RIGHT ATRIUM           Index LA diam:        4.20 cm 2.56 cm/m   RA Area:     16.20 cm LA Vol (A2C):   69.6 ml 42.41 ml/m  RA Volume:   39.90 ml  24.31 ml/m LA Vol (A4C):   51.8 ml 31.56 ml/m LA Biplane Vol: 59.7 ml 36.37 ml/m  AORTIC VALVE LVOT Vmax:   79.70 cm/s LVOT Vmean:  53.500 cm/s LVOT VTI:    0.189 m  AORTA Ao Root diam: 2.90 cm Ao Asc diam:  2.60 cm MITRAL VALVE               TRICUSPID VALVE MV Area (PHT): 3.08 cm    TR Peak grad:   32.3 mmHg MV Decel Time: 246 msec    TR Vmax:        284.00 cm/s MV E velocity: 84.40 cm/s MV A velocity: 70.20 cm/s  SHUNTS MV E/A ratio:  1.20        Systemic VTI:  0.19 m                            Systemic Diam: 2.00 cm Donato Schultz MD Electronically signed by Donato Schultz MD Signature Date/Time: 08/08/2023/3:10:02 PM    Final      LOS: 2 days   Jeoffrey Massed, MD  [facility]    To contact the attending provider between 7A-7P or the covering provider during after hours 7P-7A, please log into the web site [email or web address] and access using universal [place] password for that web site. If you do not have the password, please call the hospital operator.  08/10/2023, 10:12 AM

## 2023-08-10 NOTE — Evaluation (Signed)
 Occupational Therapy Evaluation Patient Details Name: [name] MRN: [id number] DOB: [birth year] Today's Date: 08/10/2023   History of Present Illness   89 y.o. female presents to [facility] 08/07/23 with acute asthma exacerbation and LE edema. CT chest showed mild pulmonary edema, effusion, and patchy airspace opacities within lower lobes bilaterally. Possible PNA, AKI superimposed on CKD V, acute on chronic CHF. PMH: HTN, HLD, asthma, CKD stage V, DM2, CKD, lupus, GERD.     Clinical Impressions Prior to this admission, patient living with her granddaughter, and still active in the community, driving, and occaisionally walking with a cane. Currently, patient CGA for bed mobility, transfers, and ambulation. Patient with mild cognitive impairment, however patient admits that her granddaughter manages her medications, and appears to be close to her baseline from a cognitive perspective. Patient able to walk to the bathroom, and back, and then nephew present for end of session. Patient has 24/7 support from family. Given current level, OT is not anticipating need for OT follow up at discharge, OT will follow acutely.      If plan is discharge home, recommend the following:   A little help with walking and/or transfers;A little help with bathing/dressing/bathroom;Assistance with cooking/housework;Direct supervision/assist for medications management;Direct supervision/assist for financial management;Assist for transportation;Supervision due to cognitive status;Help with stairs or ramp for entrance (initially)     Functional Status Assessment   Patient has had a recent decline in their functional status and demonstrates the ability to make significant improvements in function in a reasonable and predictable amount of time.     Equipment Recommendations   Tub/shower bench     Recommendations for Other Services         Precautions/Restrictions   Precautions Precautions:  Fall Precaution/Restrictions Comments: compression stocking Restrictions Weight Bearing Restrictions Per Provider Order: No     Mobility Bed Mobility Overal bed mobility: Needs Assistance Bed Mobility: Supine to Sit     Supine to sit: Contact guard     General bed mobility comments: CGA for safety    Transfers Overall transfer level: Needs assistance Equipment used: Rolling walker (2 wheels) Transfers: Sit to/from Stand Sit to Stand: Contact guard assist           General transfer comment: cues for hand placement, supervision for safety with RW, able to transfer to bathroom (BSC over toilet) and use hand rails to come up into standing      Balance Overall balance assessment: Needs assistance, Mild deficits observed, not formally tested Sitting-balance support: No upper extremity supported, Feet supported Sitting balance-Leahy Scale: Good     Standing balance support: Bilateral upper extremity supported, During functional activity, Reliant on assistive device for balance Standing balance-Leahy Scale: Poor Standing balance comment: reliant on RW                           ADL either performed or assessed with clinical judgement   ADL Overall ADL's : Needs assistance/impaired Eating/Feeding: Set up;Sitting   Grooming: Wash/dry hands;Set up;Standing   Upper Body Bathing: Contact guard assist;Sitting   Lower Body Bathing: Minimal assistance;Sit to/from stand;Sitting/lateral leans   Upper Body Dressing : Contact guard assist;Sitting   Lower Body Dressing: Minimal assistance;Sit to/from stand;Sitting/lateral leans   Toilet Transfer: Contact guard assist;Ambulation;Rolling walker (2 wheels) Toilet Transfer Details (indicate cue type and reason): BSC over top Toileting- Clothing Manipulation and Hygiene: Contact guard assist;Sitting/lateral lean;Sit to/from stand       Functional mobility during ADLs:  Contact guard assist;Cueing for safety;Cueing for  sequencing;Rolling walker (2 wheels) General ADL Comments: Prior to this admission, patient living with her granddaughter, and still active in the community, driving, and occaisionally walking with a cane. Currently, patient CGA for bed mobility, transfers, and ambulation. Patient with mild cognitive impairment, however patient admits that her granddaughter manages her medications, and appears to be close to her baseline from a cognitive perspective. Patient able to walk to the bathroom, and back, and then nephew present for end of session. Patient has 24/7 support from family. Given current level, OT is not anticipating need for OT follow up at discharge, OT will follow acutely.     Vision Baseline Vision/History: 0 No visual deficits Ability to See in Adequate Light: 0 Adequate Patient Visual Report: No change from baseline Vision Assessment?: No apparent visual deficits     Perception Perception: Not tested       Praxis Praxis: Not tested       Pertinent Vitals/Pain Pain Assessment Pain Assessment: No/denies pain Faces Pain Scale: No hurt Pain Intervention(s): Monitored during session, Limited activity within patient's tolerance, Repositioned     Extremity/Trunk Assessment Upper Extremity Assessment Upper Extremity Assessment: Generalized weakness;Overall [facility] for tasks assessed   Lower Extremity Assessment Lower Extremity Assessment: Defer to PT evaluation   Cervical / Trunk Assessment Cervical / Trunk Assessment: Normal   Communication Communication Communication: No apparent difficulties   Cognition Arousal: Alert Behavior During Therapy: WFL for tasks assessed/performed Cognition: Cognition impaired   Orientation impairments: Time Awareness: Intellectual awareness intact Memory impairment (select all impairments): Short-term memory Attention impairment (select first level of impairment): Selective attention Executive functioning impairment (select all impairments):  Problem solving, Sequencing OT - Cognition Comments: Patient unaware of month or year, needed minor cues to step all the way up to the sink and wash both hands, otherwise approrpriate, likely at her baseline                 Following commands: Impaired Following commands impaired: Follows one step commands with increased time, Only follows one step commands consistently     Cueing  General Comments   Cueing Techniques: Verbal cues  VSS on RA   Exercises     Shoulder Instructions      Home Living Family/patient expects to be discharged to:: Private residence Living Arrangements: Other relatives;Children Available Help at Discharge: Family;Available 24 hours/day Type of Home: House Home Access: Level entry     Home Layout: One level     Bathroom Shower/Tub: Chief Strategy Officer: Standard     Home Equipment: Cane - single point;Shower seat;Rollator (4 wheels)          Prior Functioning/Environment Prior Level of Function : Independent/Modified Independent;Driving             Mobility Comments: SP cane in the home, rollator in the community ADLs Comments: drives, grocery shops, goes to church. 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 and sometimes able to manage, granddaughter double checks medications    OT Problem List: Decreased strength;Decreased activity tolerance;Impaired balance (sitting and/or standing);Decreased cognition;Increased edema   OT Treatment/Interventions: Self-care/ADL training;Therapeutic exercise;Energy conservation;DME and/or AE instruction;Manual therapy;Therapeutic activities;Patient/family education;Balance training      OT Goals(Current goals can be found in the care plan section)   Acute Rehab OT Goals Patient Stated Goal: to get better OT Goal Formulation: With patient Time For Goal Achievement: 08/24/23 Potential to Achieve Goals: Good ADL Goals Pt Will Perform Lower Body Bathing:  with  set-up;sitting/lateral leans;sit to/from stand Pt Will Perform Lower Body Dressing: with set-up;sit to/from stand;sitting/lateral leans Pt Will Transfer to Toilet: with set-up;regular height toilet;ambulating Pt Will Perform Toileting - Clothing Manipulation and hygiene: with set-up;sitting/lateral leans;sit to/from stand Additional ADL Goal #1: Patient will be able to complete functional task in standing for 3-5 minutes prior to needing seated rest break in order to increase overall activity tolerance.   OT Frequency:  Min 2X/week    Co-evaluation              AM-PAC OT "6 Clicks" Daily Activity     Outcome Measure Help from another person eating meals?: A Little Help from another person taking care of personal grooming?: A Little Help from another person toileting, which includes using toliet, bedpan, or urinal?: A Little Help from another person bathing (including washing, rinsing, drying)?: A Little Help from another person to put on and taking off regular upper body clothing?: A Little Help from another person to put on and taking off regular lower body clothing?: A Little 6 Click Score: 18   End of Session Equipment Utilized During Treatment: Gait belt;Rolling walker (2 wheels) Nurse Communication: Mobility status  Activity Tolerance: Patient tolerated treatment well Patient left: in bed;with call bell/phone within reach;with bed alarm set;with family/visitor present  OT Visit Diagnosis: Unsteadiness on feet (R26.81);Other abnormalities of gait and mobility (R26.89);Muscle weakness (generalized) (M62.81);Other symptoms and signs involving cognitive function                Time: 1610-9604 OT Time Calculation (min): 22 min Charges:  OT General Charges $OT Visit: 1 Visit OT Evaluation $OT Eval Moderate Complexity: 1 Mod  Pollyann Glen E.  Brim, OTR/L Acute Rehabilitation Services [phone]   Cherlyn Cushing 08/10/2023, 10:57 AM

## 2023-08-10 NOTE — Progress Notes (Signed)
 Hypertensive urgency AKI on CKD stage IV RN reported that patient's blood pressure is elevated in 190/76. Per chart review patient being admitted for AKI on CKD stage IV and acute on chronic combined CHF exacerbation.  Currently on Coreg 6.25 mg twice daily and Lasix 40 mg IV twice daily. In the setting of hypertensive emergency starting IV hydralazine 20 mg every 6 as needed for SBP above 1 systolic BP above 120.  Holding IV as needed labetalol as patient is on Coreg and heart rate is around 68-83 strange.   Tereasa Coop, MD [facility] 08/10/2023, 3:52 AM

## 2023-08-10 NOTE — Progress Notes (Signed)
   08/10/23 1357  Mobility  Activity Ambulated with assistance to bathroom  Level of Assistance Contact guard assist, steadying assist  Assistive Device Front wheel walker  Distance Ambulated (ft) 20 ft  Activity Response Tolerated fair  Mobility Referral Yes  Mobility visit 1 Mobility  Mobility Specialist Start Time (ACUTE ONLY) 1410  Mobility Specialist Stop Time (ACUTE ONLY) 1427  Mobility Specialist Time Calculation (min) (ACUTE ONLY) 17 min   Mobility Specialist: Progress Note  Pre-Mobility:      HR 79 Post-Mobility:    HR 76  Pt agreeable to mobility session - received in chair. C/o BLE pain in feet. Returned to chair with all needs met - call bell within reach. Chair alarm on. Sister and niece present.   [name], BS Mobility Specialist Please contact via SecureChat or  Rehab office at [phone].

## 2023-08-11 DIAGNOSIS — I5033 Acute on chronic diastolic (congestive) heart failure: Secondary | ICD-10-CM | POA: Diagnosis not present

## 2023-08-11 DIAGNOSIS — E872 Acidosis, unspecified: Secondary | ICD-10-CM | POA: Diagnosis not present

## 2023-08-11 DIAGNOSIS — N179 Acute kidney failure, unspecified: Secondary | ICD-10-CM | POA: Diagnosis not present

## 2023-08-11 DIAGNOSIS — J4541 Moderate persistent asthma with (acute) exacerbation: Secondary | ICD-10-CM | POA: Diagnosis not present

## 2023-08-11 LAB — BASIC METABOLIC PANEL WITH GFR
Anion gap: 13 (ref 5–15)
BUN: 76 mg/dL — ABNORMAL HIGH (ref 8–23)
CO2: 18 mmol/L — ABNORMAL LOW (ref 22–32)
Calcium: 8 mg/dL — ABNORMAL LOW (ref 8.9–10.3)
Chloride: 107 mmol/L (ref 98–111)
Creatinine, Ser: 5.53 mg/dL — ABNORMAL HIGH (ref 0.44–1.00)
GFR, Estimated: 7 mL/min — ABNORMAL LOW (ref >60)
Glucose, Bld: 99 mg/dL (ref 70–99)
Potassium: 4 mmol/L (ref 3.5–5.1)
Sodium: 138 mmol/L (ref 135–145)

## 2023-08-11 MED ORDER — HYDRALAZINE HCL 25 MG PO TABS
25.0000 mg | ORAL_TABLET | Freq: Three times a day (TID) | ORAL | Status: DC
  Administered 2023-08-11 – 2023-08-12 (×3): 25 mg via ORAL
  Filled 2023-08-11 (×3): qty 1

## 2023-08-11 MED ORDER — ENSURE ENLIVE PO LIQD
237.0000 mL | Freq: Two times a day (BID) | ORAL | Status: DC
  Administered 2023-08-11 – 2023-08-13 (×6): 237 mL via ORAL

## 2023-08-11 NOTE — Plan of Care (Signed)

## 2023-08-11 NOTE — Plan of Care (Signed)
     Referral received for [name]: goals of care discussion. Chart reviewed. Patient assessed. Family at bedside. Patient's niece called patient's granddaughter [name] to follow up on time for goals of care discussion.   GOC meeting planned for Monday 3/31. Provider to call and schedule tomorrow. Family is aware we will meet at patient's bedside.   Detailed note and recommendations to follow once GOC has been completed.   Thank you for your referral and allowing PMT to assist in [name]'s care.   Sarina Ser, NP Palliative Medicine Team  Team Phone # [phone]   NO CHARGE

## 2023-08-11 NOTE — Plan of Care (Signed)
  Problem: Health Behavior/Discharge Planning: Goal: Ability to manage health-related needs will improve Outcome: Progressing   Problem: Clinical Measurements: Goal: Ability to maintain clinical measurements within normal limits will improve Outcome: Progressing   Problem: Activity: Goal: Risk for activity intolerance will decrease Outcome: Progressing   Problem: Safety: Goal: Ability to remain free from injury will improve Outcome: Progressing   Problem: Activity: Goal: Ability to tolerate increased activity will improve Outcome: Progressing

## 2023-08-11 NOTE — Progress Notes (Signed)
 Mobility Specialist Progress Note:   08/11/23 1220  Mobility  Activity Ambulated with assistance in hallway  Level of Assistance Standby assist, set-up cues, supervision of patient - no hands on  Assistive Device Front wheel walker  Distance Ambulated (ft) 80 ft  Activity Response Tolerated well  Mobility Referral Yes  Mobility visit 1 Mobility  Mobility Specialist Start Time (ACUTE ONLY) 0955  Mobility Specialist Stop Time (ACUTE ONLY) 1005  Mobility Specialist Time Calculation (min) (ACUTE ONLY) 10 min   Received pt in chair having no complaints and agreeable to mobility. Pt was asymptomatic throughout ambulation and returned to room w/o fault. Left in chair w/ call bell in reach and all needs met. Chair alarm on.   Thompson Grayer Mobility Specialist  Please contact vis Secure Chat or  Rehab Office [phone]

## 2023-08-11 NOTE — Progress Notes (Signed)
 PROGRESS NOTE        PATIENT DETAILS Name: [name] Age: 89 y.o. Sex: female Date of Birth: 1933/05/18 Admit Date: 08/07/2023 Admitting Physician Angela Pop, MD ZOX:WRUEAV, Angela Mends, MD  Brief Summary: Patient is a 89 y.o.  female with history of HTN, DM-2, HFpEF, CKD 5, normocytic anemia-who presented with shortness of breath-she was found to have AKI on CKD 5 along with HFpEF exacerbation.  She was subsequently admitted to the hospitalist service.  Significant events: 3/25>> admit to [facility]  Significant studies: 3/26>> CT chest: Right perihilar fullness related to pulmonary artery/pulmonary venous structures-no mass-patchy airspace disease bilaterally. 3/26>> Echo: EF 35-40%, grade 3 diastolic dysfunction.  Significant microbiology data: None  Procedures: None  Consults: Nephrology  Subjective: Appears comfortable-breathing is slowly improving.  Objective: Vitals: Blood pressure (!) 187/75, pulse 77, temperature 98.2 F (36.8 C), temperature source Oral, resp. rate 20, height 5\' 3"  (1.6 m), weight 63.5 kg, SpO2 97%.   Exam: Gen Exam:Alert awake-not in any distress HEENT:atraumatic, normocephalic Chest: Few rhonchi/bibasilar rales but mostly clear. CVS:S1S2 regular Abdomen:soft non tender, non distended Extremities:no edema Neurology: Non focal Skin: no rash  Pertinent Labs/Radiology:    Latest Ref Rng & Units 08/10/2023    5:41 AM 08/09/2023    4:52 AM 08/08/2023   12:05 AM  CBC  WBC 4.0 - 10.5 K/uL 7.1  6.7  7.5   Hemoglobin 12.0 - 15.0 g/dL 9.2  8.6  40.9   Hematocrit 36.0 - 46.0 % 28.3  26.3  32.3   Platelets 150 - 400 K/uL 189  155  167     Lab Results  Component Value Date   NA 138 08/11/2023   K 4.0 08/11/2023   CL 107 08/11/2023   CO2 18 (L) 08/11/2023      Assessment/Plan: AKI on CKD 5 Metabolic acidosis secondary to worsening renal function. AKI likely hemodynamically mediated Volume status  improved-still with some peripheral edema Has been transitioned to oral diuretics Continue oral bicarb Per patient's prior advanced directives-she does not wish to pursue hemodialysis-family is respecting of patient's wishes Palliative care following-family meeting planned Nephrology input appreciated. Continue to monitor renal function.  Acute on chronic combined systolic/diastolic heart failure Although volume status improved-still volume overloaded Continue diuretics Echo with reduced EF-do not think she is a candidate for invasive care-given advanced renal disease/frailty/age-plan is to treat medically Continue beta-blocker Continue nitrates/hydralazine.  Asthma exacerbation versus cardiac asthma Continue bronchodilators-short course of steroids Slowly improving-still with some scattered rhonchi.  ?  PNA No clinical symptomatology suggestive of pneumonia-suspect changes in CT-more consistent with CHF. No longer antibiotics since 3/27-continue to monitor closely.  Normocytic anemia Secondary to CKD Follow CBC  HTN BP still on the higher side Continue Coreg/hydralazine/Imdur/diuretics Follow/optimize  HLD Statin  Palliative care Unfortunately-now with advanced renal failure-does not desire HD-also with new diagnosis of HFrEF.  Not a candidate for any aggressive care-plan is to manage medically as much as we can.   Discussed with daughter over the phone on 3/27-she is agreeable with medical management-and is agreeable for goals of care conversation with the palliative care team.  Probably will benefit from initiation of hospice/palliative care on discharge.  BMI: Estimated body mass index is 24.8 kg/m as calculated from the following:   Height as of this encounter: 5\' 3"  (1.6 m).   Weight as of this  encounter: 63.5 kg.   Code status:   Code Status: Full Code   DVT Prophylaxis: heparin injection 5,000 Units Start: 08/08/23 1400   Family Communication:  [name] [phone] updated over the phone on 3/27.   Disposition Plan: Status is: Inpatient Remains inpatient appropriate because: Severity of illness   Planned Discharge Destination:Home health   Diet: Diet Order             Diet Heart Room service appropriate? Yes; Fluid consistency: Thin  Diet effective now                     Antimicrobial agents: Anti-infectives (From admission, onward)    Start     Dose/Rate Route Frequency Ordered Stop   08/08/23 0545  cefTRIAXone (ROCEPHIN) 1 g in sodium chloride 0.9 % 100 mL IVPB  Status:  Discontinued        1 g 200 mL/hr over 30 Minutes Intravenous Every 24 hours 08/08/23 0533 08/09/23 1024   08/08/23 0545  azithromycin (ZITHROMAX) 500 mg in sodium chloride 0.9 % 250 mL IVPB  Status:  Discontinued        500 mg 250 mL/hr over 60 Minutes Intravenous Every 24 hours 08/08/23 0533 08/09/23 1024        MEDICATIONS: Scheduled Meds:  atorvastatin  40 mg Oral Daily   carvedilol  6.25 mg Oral BID WC   citalopram  10 mg Oral Daily   feeding supplement  237 mL Oral BID BM   fluticasone furoate-vilanterol  1 puff Inhalation Daily   And   umeclidinium bromide  1 puff Inhalation Daily   folic acid  1 mg Oral Daily   furosemide  60 mg Oral BID   guaiFENesin  600 mg Oral BID   heparin  5,000 Units Subcutaneous Q8H   hydrALAZINE  25 mg Oral Q8H   ipratropium-albuterol  3 mL Nebulization Once   isosorbide mononitrate  15 mg Oral Daily   montelukast  10 mg Oral Daily   pantoprazole  40 mg Oral Daily   predniSONE  40 mg Oral Q breakfast   sodium bicarbonate  1,300 mg Oral BID   Continuous Infusions:   PRN Meds:.acetaminophen **OR** acetaminophen, albuterol, guaiFENesin-dextromethorphan, hydrALAZINE, HYDROcodone-acetaminophen, LORazepam, ondansetron **OR** ondansetron (ZOFRAN) IV, oxyCODONE   I have personally reviewed following labs and imaging studies  LABORATORY DATA: CBC: Recent Labs  Lab  08/08/23 0005 08/09/23 0452 08/10/23 0541  WBC 7.5 6.7 7.1  NEUTROABS 5.7 5.2  --   HGB 10.2* 8.6* 9.2*  HCT 32.3* 26.3* 28.3*  MCV 94.7 92.3 91.0  PLT 167 155 189    Basic Metabolic Panel: Recent Labs  Lab 08/08/23 0005 08/09/23 0452 08/10/23 0541 08/11/23 0737  NA 139 140 140 138  K 4.1 4.3 4.1 4.0  CL 110 111 107 107  CO2 16* 18* 20* 18*  GLUCOSE 100* 105* 109* 99  BUN 75* 76* 77* 76*  CREATININE 5.71* 5.77* 5.66* 5.53*  CALCIUM 8.0* 7.9* 8.2* 8.0*  MG  --  1.7 2.3  --   PHOS  --   --  6.6*  --     GFR: Estimated Creatinine Clearance: 6.4 mL/min (A) (by C-G formula based on SCr of 5.53 mg/dL (H)).  Liver Function Tests: Recent Labs  Lab 08/10/23 0541  ALBUMIN 2.3*   No results for input(s): "LIPASE", "AMYLASE" in the last 168 hours. No results for input(s): "AMMONIA" in the last 168 hours.  Coagulation Profile: No results for input(s): "  INR", "PROTIME" in the last 168 hours.  Cardiac Enzymes: No results for input(s): "CKTOTAL", "CKMB", "CKMBINDEX", "TROPONINI" in the last 168 hours.  BNP (last 3 results) No results for input(s): "PROBNP" in the last 8760 hours.  Lipid Profile: No results for input(s): "CHOL", "HDL", "LDLCALC", "TRIG", "CHOLHDL", "LDLDIRECT" in the last 72 hours.  Thyroid Function Tests: No results for input(s): "TSH", "T4TOTAL", "FREET4", "T3FREE", "THYROIDAB" in the last 72 hours.  Anemia Panel: No results for input(s): "VITAMINB12", "FOLATE", "FERRITIN", "TIBC", "IRON", "RETICCTPCT" in the last 72 hours.  Urine analysis:    Component Value Date/Time   COLORURINE YELLOW 04/25/2023 1221   APPEARANCEUR CLEAR 04/25/2023 1221   LABSPEC 1.010 04/25/2023 1221   PHURINE 6.0 04/25/2023 1221   GLUCOSEU NEGATIVE 04/25/2023 1221   GLUCOSEU NEGATIVE 01/16/2023 1238   HGBUR SMALL (A) 04/25/2023 1221   BILIRUBINUR NEGATIVE 04/25/2023 1221   KETONESUR NEGATIVE 04/25/2023 1221   PROTEINUR >=300 (A) 04/25/2023 1221   UROBILINOGEN 0.2  01/16/2023 1238   NITRITE NEGATIVE 04/25/2023 1221   LEUKOCYTESUR NEGATIVE 04/25/2023 1221    Sepsis Labs: Lactic Acid, Venous    Component Value Date/Time   LATICACIDVEN 0.8 10/31/2022 0715    MICROBIOLOGY: Recent Results (from the past 240 hours)  MRSA Next Gen by PCR, Nasal     Status: None   Collection Time: 08/09/23  5:29 AM   Specimen: Nasal Mucosa; Nasal Swab  Result Value Ref Range Status   MRSA by PCR Next Gen NOT DETECTED NOT DETECTED Final    Comment: (NOTE) The GeneXpert MRSA Assay (FDA approved for NASAL specimens only), is one component of a comprehensive MRSA colonization surveillance program. It is not intended to diagnose MRSA infection nor to guide or monitor treatment for MRSA infections. Test performance is not FDA approved in patients less than 21 years old. Performed at [facility], [address] [place], [place] [address]     RADIOLOGY STUDIES/RESULTS: No results found.    LOS: 3 days   Jeoffrey Massed, MD  [facility]    To contact the attending provider between 7A-7P or the covering provider during after hours 7P-7A, please log into the web site [email or web address] and access using universal [place] password for that web site. If you do not have the password, please call the hospital operator.  08/11/2023, 11:11 AM

## 2023-08-12 DIAGNOSIS — I160 Hypertensive urgency: Secondary | ICD-10-CM

## 2023-08-12 DIAGNOSIS — N179 Acute kidney failure, unspecified: Secondary | ICD-10-CM | POA: Diagnosis not present

## 2023-08-12 DIAGNOSIS — E872 Acidosis, unspecified: Secondary | ICD-10-CM | POA: Diagnosis not present

## 2023-08-12 DIAGNOSIS — I5033 Acute on chronic diastolic (congestive) heart failure: Secondary | ICD-10-CM | POA: Diagnosis not present

## 2023-08-12 LAB — BASIC METABOLIC PANEL WITH GFR
Anion gap: 10 (ref 5–15)
BUN: 86 mg/dL — ABNORMAL HIGH (ref 8–23)
CO2: 19 mmol/L — ABNORMAL LOW (ref 22–32)
Calcium: 7.6 mg/dL — ABNORMAL LOW (ref 8.9–10.3)
Chloride: 107 mmol/L (ref 98–111)
Creatinine, Ser: 5.3 mg/dL — ABNORMAL HIGH (ref 0.44–1.00)
GFR, Estimated: 7 mL/min — ABNORMAL LOW (ref >60)
Glucose, Bld: 108 mg/dL — ABNORMAL HIGH (ref 70–99)
Potassium: 4.1 mmol/L (ref 3.5–5.1)
Sodium: 136 mmol/L (ref 135–145)

## 2023-08-12 MED ORDER — ISOSORBIDE MONONITRATE ER 30 MG PO TB24
30.0000 mg | ORAL_TABLET | Freq: Every day | ORAL | Status: DC
  Administered 2023-08-12 – 2023-08-13 (×2): 30 mg via ORAL
  Filled 2023-08-12 (×2): qty 1

## 2023-08-12 MED ORDER — TORSEMIDE 20 MG PO TABS
40.0000 mg | ORAL_TABLET | Freq: Every day | ORAL | Status: DC
  Administered 2023-08-12 – 2023-08-13 (×2): 40 mg via ORAL
  Filled 2023-08-12 (×2): qty 2

## 2023-08-12 MED ORDER — HYDRALAZINE HCL 50 MG PO TABS
50.0000 mg | ORAL_TABLET | Freq: Three times a day (TID) | ORAL | Status: DC
  Administered 2023-08-12 – 2023-08-13 (×3): 50 mg via ORAL
  Filled 2023-08-12 (×3): qty 1

## 2023-08-12 NOTE — Progress Notes (Signed)
 PROGRESS NOTE        PATIENT DETAILS Name: [name] Age: 89 y.o. Sex: female Date of Birth: [birth year] Admit Date: 08/07/2023 Admitting Physician Burnadette Pop, MD ZOX:WRUEAV, Neta Mends, MD  Brief Summary: Patient is a 89 y.o.  female with history of HTN, DM-2, HFpEF, CKD 5, normocytic anemia-who presented with shortness of breath-she was found to have AKI on CKD 5 along with HFpEF exacerbation.  She was subsequently admitted to the hospitalist service.  Significant events: 3/25>> admit to [facility]  Significant studies: 3/26>> CT chest: Right perihilar fullness related to pulmonary artery/pulmonary venous structures-no mass-patchy airspace disease bilaterally. 3/26>> Echo: EF 35-40%, grade 3 diastolic dysfunction.  Significant microbiology data: None  Procedures: None  Consults: Nephrology  Subjective: Feels better-hardly any wheezing today.  Objective: Vitals: Blood pressure (!) 164/139, pulse 70, temperature 97.8 F (36.6 C), temperature source Oral, resp. rate 20, height 5\' 3"  (1.6 m), weight 65.2 kg, SpO2 100%.   Exam: Gen Exam:Alert awake-not in any distress HEENT:atraumatic, normocephalic Chest: B/L clear to auscultation anteriorly CVS:S1S2 regular Abdomen:soft non tender, non distended Extremities:+ edema Neurology: Non focal Skin: no rash  Pertinent Labs/Radiology:    Latest Ref Rng & Units 08/10/2023    5:41 AM 08/09/2023    4:52 AM 08/08/2023   12:05 AM  CBC  WBC 4.0 - 10.5 K/uL 7.1  6.7  7.5   Hemoglobin 12.0 - 15.0 g/dL 9.2  8.6  40.9   Hematocrit 36.0 - 46.0 % 28.3  26.3  32.3   Platelets 150 - 400 K/uL 189  155  167     Lab Results  Component Value Date   NA 136 08/12/2023   K 4.1 08/12/2023   CL 107 08/12/2023   CO2 19 (L) 08/12/2023      Assessment/Plan: AKI on CKD 5 Metabolic acidosis secondary to worsening renal function. AKI likely hemodynamically mediated Volume status is improved-still with some  mild peripheral edema Will switch Lasix to Demadex for ease of use-as daily dosing Continue oral bicarb Per patient's prior advanced directives-she does not wish to pursue hemodialysis-family is respecting of patient's wishes Palliative care following-family meeting planned tomorrow with palliative care team. Nephrology input appreciated. Continue to monitor renal function.  Acute on chronic combined systolic/diastolic heart failure Volume status improving rapidly Continue diuretics  Echo with reduced EF-do not think she is a candidate for invasive care-given advanced renal disease/frailty/age-plan is to treat medically Continue beta-blocker Continue nitrates/hydralazine.  Asthma exacerbation versus cardiac asthma Continue bronchodilators-short course of steroids Slowly improving-still with some scattered rhonchi.  ?  PNA No clinical symptomatology suggestive of pneumonia-suspect changes in CT-more consistent with CHF. No longer antibiotics since 3/27-continue to monitor closely.  Normocytic anemia Secondary to CKD Follow CBC  HTN BP still remains on the higher side Increase hydralazine/Imdur today Continue current dose of Coreg Continue diuretics Follow/optimize.   HLD Statin  Palliative care Unfortunately-now with advanced renal failure-does not desire HD-also with new diagnosis of HFrEF.  Not a candidate for any aggressive care-plan is to manage medically as much as we can.   Discussed with daughter over the phone on 3/27-she is agreeable with medical management-and is agreeable for goals of care conversation with the palliative care team.  Probably will benefit from initiation of hospice/palliative care on discharge.  BMI: Estimated body mass index is 25.46 kg/m as calculated  from the following:   Height as of this encounter: 5\' 3"  (1.6 m).   Weight as of this encounter: 65.2 kg.   Code status:   Code Status: Full Code   DVT Prophylaxis: heparin injection 5,000  Units Start: 08/08/23 1400   Family Communication: Alvera Tourigny [phone] updated over the phone on 3/27.   Disposition Plan: Status is: Inpatient Remains inpatient appropriate because: Severity of illness   Planned Discharge Destination:Home health   Diet: Diet Order             Diet Heart Room service appropriate? Yes; Fluid consistency: Thin  Diet effective now                     Antimicrobial agents: Anti-infectives (From admission, onward)    Start     Dose/Rate Route Frequency Ordered Stop   08/08/23 0545  cefTRIAXone (ROCEPHIN) 1 g in sodium chloride 0.9 % 100 mL IVPB  Status:  Discontinued        1 g 200 mL/hr over 30 Minutes Intravenous Every 24 hours 08/08/23 0533 08/09/23 1024   08/08/23 0545  azithromycin (ZITHROMAX) 500 mg in sodium chloride 0.9 % 250 mL IVPB  Status:  Discontinued        500 mg 250 mL/hr over 60 Minutes Intravenous Every 24 hours 08/08/23 0533 08/09/23 1024        MEDICATIONS: Scheduled Meds:  atorvastatin  40 mg Oral Daily   carvedilol  6.25 mg Oral BID WC   citalopram  10 mg Oral Daily   feeding supplement  237 mL Oral BID BM   fluticasone furoate-vilanterol  1 puff Inhalation Daily   And   umeclidinium bromide  1 puff Inhalation Daily   folic acid  1 mg Oral Daily   guaiFENesin  600 mg Oral BID   heparin  5,000 Units Subcutaneous Q8H   hydrALAZINE  50 mg Oral Q8H   ipratropium-albuterol  3 mL Nebulization Once   isosorbide mononitrate  30 mg Oral Daily   montelukast  10 mg Oral Daily   pantoprazole  40 mg Oral Daily   sodium bicarbonate  1,300 mg Oral BID   torsemide  40 mg Oral Daily   Continuous Infusions:   PRN Meds:.acetaminophen **OR** acetaminophen, albuterol, guaiFENesin-dextromethorphan, hydrALAZINE, HYDROcodone-acetaminophen, LORazepam, ondansetron **OR** ondansetron (ZOFRAN) IV, oxyCODONE   I have personally reviewed following labs and imaging studies  LABORATORY DATA: CBC: Recent Labs   Lab 08/08/23 0005 08/09/23 0452 08/10/23 0541  WBC 7.5 6.7 7.1  NEUTROABS 5.7 5.2  --   HGB 10.2* 8.6* 9.2*  HCT 32.3* 26.3* 28.3*  MCV 94.7 92.3 91.0  PLT 167 155 189    Basic Metabolic Panel: Recent Labs  Lab 08/08/23 0005 08/09/23 0452 08/10/23 0541 08/11/23 0737 08/12/23 0530  NA 139 140 140 138 136  K 4.1 4.3 4.1 4.0 4.1  CL 110 111 107 107 107  CO2 16* 18* 20* 18* 19*  GLUCOSE 100* 105* 109* 99 108*  BUN 75* 76* 77* 76* 86*  CREATININE 5.71* 5.77* 5.66* 5.53* 5.30*  CALCIUM 8.0* 7.9* 8.2* 8.0* 7.6*  MG  --  1.7 2.3  --   --   PHOS  --   --  6.6*  --   --     GFR: Estimated Creatinine Clearance: 6.8 mL/min (A) (by C-G formula based on SCr of 5.3 mg/dL (H)).  Liver Function Tests: Recent Labs  Lab 08/10/23 0541  ALBUMIN 2.3*   No  results for input(s): "LIPASE", "AMYLASE" in the last 168 hours. No results for input(s): "AMMONIA" in the last 168 hours.  Coagulation Profile: No results for input(s): "INR", "PROTIME" in the last 168 hours.  Cardiac Enzymes: No results for input(s): "CKTOTAL", "CKMB", "CKMBINDEX", "TROPONINI" in the last 168 hours.  BNP (last 3 results) No results for input(s): "PROBNP" in the last 8760 hours.  Lipid Profile: No results for input(s): "CHOL", "HDL", "LDLCALC", "TRIG", "CHOLHDL", "LDLDIRECT" in the last 72 hours.  Thyroid Function Tests: No results for input(s): "TSH", "T4TOTAL", "FREET4", "T3FREE", "THYROIDAB" in the last 72 hours.  Anemia Panel: No results for input(s): "VITAMINB12", "FOLATE", "FERRITIN", "TIBC", "IRON", "RETICCTPCT" in the last 72 hours.  Urine analysis:    Component Value Date/Time   COLORURINE YELLOW 04/25/2023 1221   APPEARANCEUR CLEAR 04/25/2023 1221   LABSPEC 1.010 04/25/2023 1221   PHURINE 6.0 04/25/2023 1221   GLUCOSEU NEGATIVE 04/25/2023 1221   GLUCOSEU NEGATIVE 01/16/2023 1238   HGBUR SMALL (A) 04/25/2023 1221   BILIRUBINUR NEGATIVE 04/25/2023 1221   KETONESUR NEGATIVE 04/25/2023  1221   PROTEINUR >=300 (A) 04/25/2023 1221   UROBILINOGEN 0.2 01/16/2023 1238   NITRITE NEGATIVE 04/25/2023 1221   LEUKOCYTESUR NEGATIVE 04/25/2023 1221    Sepsis Labs: Lactic Acid, Venous    Component Value Date/Time   LATICACIDVEN 0.8 10/31/2022 0715    MICROBIOLOGY: Recent Results (from the past 240 hours)  MRSA Next Gen by PCR, Nasal     Status: None   Collection Time: 08/09/23  5:29 AM   Specimen: Nasal Mucosa; Nasal Swab  Result Value Ref Range Status   MRSA by PCR Next Gen NOT DETECTED NOT DETECTED Final    Comment: (NOTE) The GeneXpert MRSA Assay (FDA approved for NASAL specimens only), is one component of a comprehensive MRSA colonization surveillance program. It is not intended to diagnose MRSA infection nor to guide or monitor treatment for MRSA infections. Test performance is not FDA approved in patients less than 73 years old. Performed at [facility], [address], [place], [place] [address]     RADIOLOGY STUDIES/RESULTS: No results found.    LOS: 4 days   Jeoffrey Massed, MD  [facility]    To contact the attending provider between 7A-7P or the covering provider during after hours 7P-7A, please log into the web site [email or web address] and access using universal [place] password for that web site. If you do not have the password, please call the hospital operator.  08/12/2023, 10:36 AM

## 2023-08-12 NOTE — Plan of Care (Signed)
  Problem: Clinical Measurements: Goal: Ability to maintain clinical measurements within normal limits will improve Outcome: Progressing Goal: Will remain free from infection Outcome: Progressing   Problem: Activity: Goal: Risk for activity intolerance will decrease Outcome: Progressing   Problem: Safety: Goal: Ability to remain free from injury will improve Outcome: Progressing   

## 2023-08-12 NOTE — Plan of Care (Signed)

## 2023-08-13 ENCOUNTER — Telehealth: Payer: Self-pay | Admitting: Internal Medicine

## 2023-08-13 DIAGNOSIS — I5033 Acute on chronic diastolic (congestive) heart failure: Secondary | ICD-10-CM | POA: Diagnosis not present

## 2023-08-13 DIAGNOSIS — Z515 Encounter for palliative care: Secondary | ICD-10-CM | POA: Diagnosis not present

## 2023-08-13 DIAGNOSIS — E872 Acidosis, unspecified: Secondary | ICD-10-CM | POA: Diagnosis not present

## 2023-08-13 DIAGNOSIS — N179 Acute kidney failure, unspecified: Secondary | ICD-10-CM | POA: Diagnosis not present

## 2023-08-13 DIAGNOSIS — N185 Chronic kidney disease, stage 5: Secondary | ICD-10-CM | POA: Diagnosis not present

## 2023-08-13 DIAGNOSIS — Z7189 Other specified counseling: Secondary | ICD-10-CM

## 2023-08-13 DIAGNOSIS — D631 Anemia in chronic kidney disease: Secondary | ICD-10-CM

## 2023-08-13 LAB — BASIC METABOLIC PANEL WITH GFR
Anion gap: 12 (ref 5–15)
BUN: 94 mg/dL — ABNORMAL HIGH (ref 8–23)
CO2: 18 mmol/L — ABNORMAL LOW (ref 22–32)
Calcium: 7.7 mg/dL — ABNORMAL LOW (ref 8.9–10.3)
Chloride: 106 mmol/L (ref 98–111)
Creatinine, Ser: 5.24 mg/dL — ABNORMAL HIGH (ref 0.44–1.00)
GFR, Estimated: 7 mL/min — ABNORMAL LOW (ref >60)
Glucose, Bld: 101 mg/dL — ABNORMAL HIGH (ref 70–99)
Potassium: 3.8 mmol/L (ref 3.5–5.1)
Sodium: 136 mmol/L (ref 135–145)

## 2023-08-13 MED ORDER — CARVEDILOL 12.5 MG PO TABS
12.5000 mg | ORAL_TABLET | Freq: Two times a day (BID) | ORAL | Status: DC
  Administered 2023-08-13: 12.5 mg via ORAL
  Filled 2023-08-13: qty 1

## 2023-08-13 MED ORDER — TORSEMIDE 40 MG PO TABS: 40.0000 mg | ORAL_TABLET | Freq: Every day | ORAL | 1 refills | Status: DC

## 2023-08-13 MED ORDER — SODIUM BICARBONATE 650 MG PO TABS: 1300.0000 mg | ORAL_TABLET | Freq: Two times a day (BID) | ORAL | 0 refills | Status: DC

## 2023-08-13 MED ORDER — HYDRALAZINE HCL 50 MG PO TABS
100.0000 mg | ORAL_TABLET | Freq: Three times a day (TID) | ORAL | Status: DC
  Administered 2023-08-13: 100 mg via ORAL
  Filled 2023-08-13: qty 2

## 2023-08-13 NOTE — Plan of Care (Signed)
   Problem: Education: Goal: Knowledge of General Education information will improve Description: Including pain rating scale, medication(s)/side effects and non-pharmacologic comfort measures Outcome: Completed/Met   Problem: Health Behavior/Discharge Planning: Goal: Ability to manage health-related needs will improve Outcome: Completed/Met   Problem: Clinical Measurements: Goal: Ability to maintain clinical measurements within normal limits will improve Outcome: Completed/Met Goal: Will remain free from infection Outcome: Completed/Met Goal: Diagnostic test results will improve Outcome: Completed/Met Goal: Respiratory complications will improve Outcome: Completed/Met Goal: Cardiovascular complication will be avoided Outcome: Completed/Met   Problem: Activity: Goal: Risk for activity intolerance will decrease Outcome: Completed/Met   Problem: Nutrition: Goal: Adequate nutrition will be maintained Outcome: Completed/Met   Problem: Coping: Goal: Level of anxiety will decrease Outcome: Completed/Met   Problem: Elimination: Goal: Will not experience complications related to bowel motility Outcome: Completed/Met Goal: Will not experience complications related to urinary retention Outcome: Completed/Met   Problem: Pain Managment: Goal: General experience of comfort will improve and/or be controlled Outcome: Completed/Met   Problem: Safety: Goal: Ability to remain free from injury will improve Outcome: Completed/Met   Problem: Skin Integrity: Goal: Risk for impaired skin integrity will decrease Outcome: Completed/Met   Problem: Education: Goal: Knowledge of disease or condition will improve Outcome: Completed/Met Goal: Knowledge of the prescribed therapeutic regimen will improve Outcome: Completed/Met Goal: Individualized Educational Video(s) Outcome: Completed/Met   Problem: Activity: Goal: Ability to tolerate increased activity will improve Outcome:  Completed/Met Goal: Will verbalize the importance of balancing activity with adequate rest periods Outcome: Completed/Met   Problem: Respiratory: Goal: Ability to maintain a clear airway will improve Outcome: Completed/Met Goal: Levels of oxygenation will improve Outcome: Completed/Met Goal: Ability to maintain adequate ventilation will improve Outcome: Completed/Met

## 2023-08-13 NOTE — Discharge Summary (Addendum)
 PATIENT DETAILS Name: [name] Age: 89 y.o. Sex: female Date of Birth: 06-Jul-1933 MRN: [id number]. Admitting Physician: Burnadette Pop, MD NWG:[id number], Neta Mends, MD  Admit Date: 08/07/2023 Discharge date: 08/13/2023  Recommendations for Outpatient Follow-up:  Follow up with PCP in 1-2 weeks Please obtain CMP/CBC in one week  Admitted From:  Home  Disposition: Home   Discharge Condition: good  CODE STATUS:   Code Status: Full Code   Diet recommendation:  Diet Order             Diet - low sodium heart healthy           Diet Heart Room service appropriate? Yes; Fluid consistency: Thin  Diet effective now                    Brief Summary: Patient is a 89 y.o.  female with history of HTN, DM-2, HFpEF, CKD 5, normocytic anemia-who presented with shortness of breath-she was found to have AKI on CKD 5 along with HFpEF exacerbation.  She was subsequently admitted to the hospitalist service.   Significant events: 3/25>> admit to [facility]   Significant studies: 3/26>> CT chest: Right perihilar fullness related to pulmonary artery/pulmonary venous structures-no mass-patchy airspace disease bilaterally. 3/26>> Echo: EF 35-40%, grade 3 diastolic dysfunction.   Significant microbiology data: None   Procedures: None   Consults: Nephrology  Brief Hospital Course: AKI on CKD 5 Metabolic acidosis secondary to worsening renal function. AKI likely hemodynamically mediated Volume status stable on diuretics.   Continue oral bicarb Per patient's prior wishes-she does not want to pursue HD. Palliative care and nephrology followed closely during this hospitalization Continue to monitor renal function closely in the outpatient setting.     Acute on chronic combined systolic/diastolic heart failure Volume status improving rapidly Continue diuretics  Echo with reduced EF-do not think she is a candidate for invasive care-given advanced renal disease/frailty/age-plan is  to treat medically. Continue beta-blocker Continue nitrates/hydralazine.   Asthma exacerbation versus cardiac asthma Continue bronchodilators-short course of steroids Slowly improving-still with some scattered rhonchi.   ?  PNA No clinical symptomatology suggestive of pneumonia-suspect changes in CT-more consistent with CHF. No longer antibiotics since 3/27-continue to monitor closely.   Normocytic anemia Secondary to CKD Follow CBC   HTN Developed hypotension when all of her home medications were started on admission-some concern that she may not be very compliant with her medications She was restarted on Coreg-however blood pressure continued to be on the higher side-hydralazine/Imdur was restarted.She remains on Amlodipine Blood pressure continues to fluctuate-still on the higher side-but plan is to follow closely in the outpatient setting with the PCP/cardiology for further optimization.   HLD Statin   Palliative care Unfortunately-now with advanced renal failure-does not desire HD-also with new diagnosis of HFrEF.  Not a candidate for any aggressive care-plan is to manage medically as much as we can.  Discussed with daughter over the phone on 3/27-she is agreeable with medical management-and is agreeable for goals of care conversation with the palliative care team which is scheduled for 3/31 afternoon-following which she can be discharged home.  I spoke with the daughter on 3/30 and confirmed above. Per daughter's request-medications were minimized as much as possible-as d/c to with palliative care is planned. No longer on Plavix/statin/cardura/folic acid. Please continue to slowly d/c meds at subsequent visits with PCP or cardiology.   BMI: Estimated body mass index is 25.46 kg/m as calculated from the following:   Height as of  this encounter: 5\' 3"  (1.6 m).   Weight as of this encounter: 65.2 kg.    Discharge Diagnoses:  Principal Problem:   Acute kidney injury  superimposed on stage 5 chronic kidney disease, not on chronic dialysis (HCC) Active Problems:   Metabolic acidosis   Acute on chronic heart failure with preserved ejection fraction (HFpEF) (HCC)   Hypertensive nephrosclerosis   Hypertensive urgency   Acute exacerbation of moderate persistent extrinsic asthma   Anemia of chronic kidney failure, stage 5 (HCC)   AKI (acute kidney injury) [facility])   Discharge Instructions:  Activity:  As tolerated with Full fall precautions use walker/cane & assistance as needed  Discharge Instructions     (HEART FAILURE PATIENTS) Call MD:  Anytime you have any of the following symptoms: 1) 3 pound weight gain in 24 hours or 5 pounds in 1 week 2) shortness of breath, with or without a dry hacking cough 3) swelling in the hands, feet or stomach 4) if you have to sleep on extra pillows at night in order to breathe.   Complete by: As directed    Call MD for:  difficulty breathing, headache or visual disturbances   Complete by: As directed    Diet - low sodium heart healthy   Complete by: As directed    Discharge instructions   Complete by: As directed    Follow with Primary MD  Panosh, Neta Mends, MD in 1-2 weeks  Please get a complete blood count and chemistry panel checked by your Primary MD at your next visit, and again as instructed by your Primary MD.  Get Medicines reviewed and adjusted: Please take all your medications with you for your next visit with your Primary MD  Laboratory/radiological data: Please request your Primary MD to go over all hospital tests and procedure/radiological results at the follow up, please ask your Primary MD to get all Hospital records sent to his/her office.  In some cases, they will be blood work, cultures and biopsy results pending at the time of your discharge. Please request that your primary care M.D. follows up on these results.  Also Note the following: If you experience worsening of your admission symptoms,  develop shortness of breath, life threatening emergency, suicidal or homicidal thoughts you must seek medical attention immediately by calling 911 or calling your MD immediately  if symptoms less severe.  You must read complete instructions/literature along with all the possible adverse reactions/side effects for all the Medicines you take and that have been prescribed to you. Take any new Medicines after you have completely understood and accpet all the possible adverse reactions/side effects.   Do not drive when taking Pain medications or sleeping medications (Benzodaizepines)  Do not take more than prescribed Pain, Sleep and Anxiety Medications. It is not advisable to combine anxiety,sleep and pain medications without talking with your primary care practitioner  Special Instructions: If you have smoked or chewed Tobacco  in the last 2 yrs please stop smoking, stop any regular Alcohol  and or any Recreational drug use.  Wear Seat belts while driving.  Please note: You were cared for by a hospitalist during your hospital stay. Once you are discharged, your primary care physician will handle any further medical issues. Please note that NO REFILLS for any discharge medications will be authorized once you are discharged, as it is imperative that you return to your primary care physician (or establish a relationship with a primary care physician if you do not have one)  for your post hospital discharge needs so that they can reassess your need for medications and monitor your lab values.   Increase activity slowly   Complete by: As directed       Allergies as of 08/13/2023       Reactions   Penicillins Nausea And Vomiting, Other (See Comments)   Has patient had a PCN reaction causing immediate rash, facial/tongue/throat swelling, SOB or lightheadedness with hypotension: Y Has patient had a PCN reaction causing severe rash involving mucus membranes or skin necrosis: Y Has patient had a PCN  reaction that required hospitalization: N Has patient had a PCN reaction occurring within the last 10 years: N If all of the above answers are "NO", then may proceed with Cephalosporin use.   Aspirin Other (See Comments)   Wheezing Acetaminophen is OK   Fish Allergy Itching, Nausea And Vomiting, Swelling   Swelling of the face        Medication List     STOP taking these medications    atorvastatin 40 MG tablet Commonly known as: LIPITOR   azithromycin 250 MG tablet Commonly known as: ZITHROMAX   clopidogrel 75 MG tablet Commonly known as: PLAVIX   doxazosin 4 MG tablet Commonly known as: CARDURA   folic acid 1 MG tablet Commonly known as: FOLVITE   furosemide 40 MG tablet Commonly known as: LASIX   metolazone 2.5 MG tablet Commonly known as: ZAROXOLYN   potassium chloride 10 MEQ tablet Commonly known as: KLOR-CON M       TAKE these medications    acetaminophen 500 MG tablet Commonly known as: TYLENOL Take 500 mg by mouth 2 (two) times daily as needed for mild pain (pain score 1-3) or headache.   AeroChamber Plus inhaler Use as instructed   albuterol (2.5 MG/3ML) 0.083% nebulizer solution Commonly known as: PROVENTIL Take 3 mLs (2.5 mg total) by nebulization every 6 (six) hours as needed for shortness of breath or wheezing.   albuterol 108 (90 Base) MCG/ACT inhaler Commonly known as: VENTOLIN HFA USE 2 PUFFS EVERY 6 HOURS AS NEEDED FOR WHEEZING.   amLODipine 10 MG tablet Commonly known as: NORVASC Take 1 tablet (10 mg total) by mouth daily. What changed: when to take this   atropine 1 % ophthalmic solution Place 1 drop into both eyes 2 (two) times daily as needed.   Azelastine-Fluticasone 137-50 MCG/ACT Susp PLACE ONE SPRAY IN EACH NOSTRIL TWICE DAILY (IN THE MORNING AND AT BEDTIME) What changed: See the new instructions.   Breztri Aerosphere 160-9-4.8 MCG/ACT Aero Generic drug: budeson-glycopyrrolate-formoterol INHALE 2 PUFFS INTO THE LUNGS  TWICE DAILY, IN THE MORNING & AT BEDTIME.   carvedilol 12.5 MG tablet Commonly known as: COREG Take 1 tablet (12.5 mg total) by mouth 2 (two) times daily.   cetirizine 10 MG tablet Commonly known as: ZYRTEC Take 10 mg by mouth daily as needed for allergies or rhinitis.   citalopram 20 MG tablet Commonly known as: CELEXA Take 0.5 tablets (10 mg total) by mouth daily.   cloNIDine 0.1 MG tablet Commonly known as: CATAPRES TAKE ONE TABLET BY MOUTH TWICE DAILY AS NEEDED for hypertension   cyanocobalamin 1000 MCG tablet Commonly known as: VITAMIN B12 Take 1,000 mcg by mouth daily.   diclofenac Sodium 1 % Gel Commonly known as: Voltaren Apply 4 g topically 2 (two) times daily as needed (painful areas).   ferrous sulfate 325 (65 FE) MG EC tablet Take 1 tablet (325 mg total) by mouth daily with breakfast.  fluticasone 50 MCG/ACT nasal spray Commonly known as: FLONASE Place 2 sprays into both nostrils as needed for allergies or rhinitis.   hydrALAZINE 100 MG tablet Commonly known as: APRESOLINE Take 1 tablet (100 mg total) by mouth 3 (three) times daily.   isosorbide mononitrate 30 MG 24 hr tablet Commonly known as: IMDUR Take 1 tablet (30 mg total) by mouth daily.   LORazepam 0.5 MG tablet Commonly known as: ATIVAN Take 1 tablet (0.5 mg total) by mouth 2 (two) times daily as needed. for anxiety   montelukast 10 MG tablet Commonly known as: SINGULAIR Take 1 tablet (10 mg total) by mouth daily. What changed: when to take this   oxyCODONE 5 MG immediate release tablet Commonly known as: Oxy IR/ROXICODONE Take 1 tablet (5 mg total) by mouth every 6 (six) hours as needed for severe pain (pain score 7-10).   pantoprazole 40 MG tablet Commonly known as: PROTONIX Take 1 tablet (40 mg total) by mouth daily. What changed: when to take this   Restasis 0.05 % ophthalmic emulsion Generic drug: cycloSPORINE Place 1 drop into both eyes 2 (two) times daily.   sodium bicarbonate  650 MG tablet Take 2 tablets (1,300 mg total) by mouth 2 (two) times daily. What changed: See the new instructions.   Torsemide 40 MG Tabs Take 40 mg by mouth daily. Start taking on: August 14, 2023   Vitamin D (Cholecalciferol) 25 MCG (1000 UT) Tabs Take 1,000 Units by mouth daily.        Follow-up Information     Panosh, Neta Mends, MD. Schedule an appointment as soon as possible for a visit in 1 week(s).   Specialties: Internal Medicine, Pediatrics Contact information: [address] [place] [address] [phone]                Allergies  Allergen Reactions   Penicillins Nausea And Vomiting and Other (See Comments)    Has patient had a PCN reaction causing immediate rash, facial/tongue/throat swelling, SOB or lightheadedness with hypotension: Y Has patient had a PCN reaction causing severe rash involving mucus membranes or skin necrosis: Y Has patient had a PCN reaction that required hospitalization: N Has patient had a PCN reaction occurring within the last 10 years: N If all of the above answers are "NO", then may proceed with Cephalosporin use.    Aspirin Other (See Comments)    Wheezing Acetaminophen is OK    Fish Allergy Itching, Nausea And Vomiting and Swelling    Swelling of the face     Other Procedures/Studies: DG Chest Port 1 View Result Date: 08/09/2023 CLINICAL DATA:  Shortness of breath. EXAM: PORTABLE CHEST 1 VIEW COMPARISON:  Chest radiograph and CT dated 08/08/2023. FINDINGS: Cardiomegaly with vascular congestion and edema similar or slightly progressed since the prior radiograph. Small bilateral pleural effusions. No pneumothorax. No acute osseous pathology. IMPRESSION: Cardiomegaly with vascular congestion and edema. Electronically Signed   By: Elgie Collard M.D.   On: 08/09/2023 10:11   ECHOCARDIOGRAM COMPLETE Result Date: 08/08/2023    ECHOCARDIOGRAM REPORT   Patient Name:   [name] [facility] Date of Exam: 08/08/2023 Medical Rec  #:  [id number]          Height:       63.0 in Accession #:    [id number]         Weight:       136.0 lb Date of Birth:  December 30, 1933  BSA:          1.641 m Patient Age:    89 years           BP:           95/65 mmHg Patient Gender: F                  HR:           59 bpm. Exam Location:  Inpatient Procedure: 2D Echo, Cardiac Doppler and Color Doppler (Both Spectral and Color            Flow Doppler were utilized during procedure). Indications:    I50.40* Unspecified combined systolic (congestive) and diastolic                 (congestive) heart failure  History:        Patient has prior history of Echocardiogram examinations, most                 recent 10/31/2022. Abnormal ECG, Pulmonary HTN,                 Signs/Symptoms:Edema, Shortness of Breath and Dyspnea; Risk                 Factors:Hypertension and Dyslipidemia. CKD.  Sonographer:    Sheralyn Boatman RDCS Referring Phys: [id number] Andris Baumann  Sonographer Comments: Study delayed for renal consult. IMPRESSIONS  1. Left ventricular ejection fraction, by estimation, is 35 to 40%. The left ventricle has moderately decreased function. The left ventricle demonstrates global hypokinesis. There is mild left ventricular hypertrophy. Left ventricular diastolic parameters are consistent with Grade III diastolic dysfunction (restrictive).  2. Right ventricular systolic function is normal. The right ventricular size is mildly enlarged. There is moderately elevated pulmonary artery systolic pressure. The estimated right ventricular systolic pressure is 47.3 mmHg.  3. The mitral valve is normal in structure. No evidence of mitral valve regurgitation. No evidence of mitral stenosis.  4. The aortic valve is normal in structure. Aortic valve regurgitation is not visualized. No aortic stenosis is present.  5. The inferior vena cava is normal in size with greater than 50% respiratory variability, suggesting right atrial pressure of 3 mmHg. Comparison(s): Prior images reviewed  side by side. The left ventricular function is worsened. FINDINGS  Left Ventricle: Left ventricular ejection fraction, by estimation, is 35 to 40%. The left ventricle has moderately decreased function. The left ventricle demonstrates global hypokinesis. The left ventricular internal cavity size was normal in size. There is mild left ventricular hypertrophy. Left ventricular diastolic parameters are consistent with Grade III diastolic dysfunction (restrictive). Right Ventricle: The right ventricular size is mildly enlarged. No increase in right ventricular wall thickness. Right ventricular systolic function is normal. There is moderately elevated pulmonary artery systolic pressure. The tricuspid regurgitant velocity is 2.84 m/s, and with an assumed right atrial pressure of 15 mmHg, the estimated right ventricular systolic pressure is 47.3 mmHg. Left Atrium: Left atrial size was normal in size. Right Atrium: Right atrial size was normal in size. Pericardium: There is no evidence of pericardial effusion. Mitral Valve: The mitral valve is normal in structure. No evidence of mitral valve regurgitation. No evidence of mitral valve stenosis. Tricuspid Valve: The tricuspid valve is normal in structure. Tricuspid valve regurgitation is mild . No evidence of tricuspid stenosis. Aortic Valve: The aortic valve is normal in structure. Aortic valve regurgitation is not visualized. No aortic stenosis is present. Pulmonic Valve: The pulmonic valve was normal  in structure. Pulmonic valve regurgitation is not visualized. No evidence of pulmonic stenosis. Aorta: The aortic root is normal in size and structure. Venous: The inferior vena cava is normal in size with greater than 50% respiratory variability, suggesting right atrial pressure of 3 mmHg. IAS/Shunts: No atrial level shunt detected by color flow Doppler.  LEFT VENTRICLE PLAX 2D LVIDd:         4.50 cm      Diastology LVIDs:         3.50 cm      LV e' medial:    3.37 cm/s LV PW:          1.30 cm      LV E/e' medial:  25.0 LV IVS:        1.10 cm      LV e' lateral:   5.27 cm/s LVOT diam:     2.00 cm      LV E/e' lateral: 16.0 LV SV:         59 LV SV Index:   36 LVOT Area:     3.14 cm  LV Volumes (MOD) LV vol d, MOD A2C: 101.0 ml LV vol d, MOD A4C: 100.0 ml LV vol s, MOD A2C: 64.1 ml LV vol s, MOD A4C: 63.8 ml LV SV MOD A2C:     36.9 ml LV SV MOD A4C:     100.0 ml LV SV MOD BP:      38.1 ml RIGHT VENTRICLE            IVC RV S prime:     9.00 cm/s  IVC diam: 2.30 cm TAPSE (M-mode): 1.4 cm LEFT ATRIUM             Index        RIGHT ATRIUM           Index LA diam:        4.20 cm 2.56 cm/m   RA Area:     16.20 cm LA Vol (A2C):   69.6 ml 42.41 ml/m  RA Volume:   39.90 ml  24.31 ml/m LA Vol (A4C):   51.8 ml 31.56 ml/m LA Biplane Vol: 59.7 ml 36.37 ml/m  AORTIC VALVE LVOT Vmax:   79.70 cm/s LVOT Vmean:  53.500 cm/s LVOT VTI:    0.189 m  AORTA Ao Root diam: 2.90 cm Ao Asc diam:  2.60 cm MITRAL VALVE               TRICUSPID VALVE MV Area (PHT): 3.08 cm    TR Peak grad:   32.3 mmHg MV Decel Time: 246 msec    TR Vmax:        284.00 cm/s MV E velocity: 84.40 cm/s MV A velocity: 70.20 cm/s  SHUNTS MV E/A ratio:  1.20        Systemic VTI:  0.19 m                            Systemic Diam: 2.00 cm Donato Schultz MD Electronically signed by Donato Schultz MD Signature Date/Time: 08/08/2023/3:10:02 PM    Final    CT Chest Wo Contrast Result Date: 08/08/2023 CLINICAL DATA:  Increasing right paratracheal density on recent chest x-ray EXAM: CT CHEST WITHOUT CONTRAST TECHNIQUE: Multidetector CT imaging of the chest was performed following the standard protocol without IV contrast. RADIATION DOSE REDUCTION: This exam was performed according to the departmental dose-optimization program which includes automated exposure control,  adjustment of the mA and/or kV according to patient size and/or use of iterative reconstruction technique. COMPARISON:  Plain film from earlier in the same day FINDINGS: Cardiovascular:  Somewhat limited due to lack of IV contrast. Atherosclerotic calcifications of the thoracic aorta are noted. No aneurysmal dilatation is seen. The heart is mildly enlarged in size. Coronary calcifications are noted. Fullness on recent chest x-ray is related to combination of pulmonary arterial and venous structures on the right and likely some rotation of the patient. Mediastinum/Nodes: Thoracic inlet is within normal limits. No sizable hilar or mediastinal adenopathy is noted. The esophagus as visualized is within normal limits. Lungs/Pleura: Small bilateral pleural effusions are noted. Mild septal thickening is noted consistent with mild edema. Some patchy airspace opacities primarily in the lower lobes bilateral consistent with early infiltrate. This is worst on the right. Upper Abdomen: Visualized upper abdomen is within normal limits. Musculoskeletal: Degenerative changes of the thoracic spine are seen. No acute rib abnormality is noted. IMPRESSION: Right perihilar fullness on recent chest x-ray is related to the pulmonary artery and pulmonary venous structures and patient rotation. No focal mass is noted. Mild changes of pulmonary edema and effusion. Patchy airspace opacities are noted within the lower lobes bilaterally. Aortic Atherosclerosis (ICD10-I70.0). Electronically Signed   By: Alcide Clever M.D.   On: 08/08/2023 03:57   DG Humerus Right Result Date: 08/08/2023 CLINICAL DATA:  Right shoulder and right humerus pain.  No injury. EXAM: RIGHT HUMERUS - 2+ VIEW COMPARISON:  None Available. FINDINGS: Degenerative changes in the glenohumeral and acromioclavicular joints as well as in the elbow joint. No evidence of acute fracture or dislocation. No focal bone lesion or bone destruction. Soft tissues are unremarkable. IMPRESSION: Degenerative changes in the right shoulder and right elbow. No acute bony abnormalities. Electronically Signed   By: Burman Nieves M.D.   On: 08/08/2023 03:15   DG Chest 2  View Result Date: 08/08/2023 CLINICAL DATA:  Shortness of breath beginning today. History of asthma. Right shoulder and right humerus pain. EXAM: CHEST - 2 VIEW COMPARISON:  01/16/2023 FINDINGS: Cardiac enlargement. Mild central vascular congestion. Interstitial changes in the lung bases likely representing edema. Prominent right paratracheal/right hilar shadows increasing since previous study. This could represent central vascularity or lymphadenopathy. CT suggested for further evaluation. No pleural effusion or pneumothorax. Degenerative changes in the spine and shoulders. Calcification of the aorta. IMPRESSION: 1. Cardiac enlargement with pulmonary vascular congestion and mild basilar edema. 2. Increasing right hilar/paratracheal shadow. CT recommended for further evaluation. Electronically Signed   By: Burman Nieves M.D.   On: 08/08/2023 00:33     TODAY-DAY OF DISCHARGE:  Subjective:   [name] today has no headache,no chest abdominal pain,no new weakness tingling or numbness, feels much better wants to go home today.   Objective:   Blood pressure (!) 181/75, pulse 64, temperature 98.2 F (36.8 C), temperature source Oral, resp. rate 11, height 5\' 3"  (1.6 m), weight 65.9 kg, SpO2 98%. No intake or output data in the 24 hours ending 08/13/23 1603 Filed Weights   08/11/23 0415 08/12/23 0333 08/13/23 0457  Weight: 63.5 kg 65.2 kg 65.9 kg    Exam: Awake Alert, Oriented *3, No new F.N deficits, Normal affect [place].AT,PERRAL Supple Neck,No JVD, No cervical lymphadenopathy appriciated.  Symmetrical Chest wall movement, Good air movement bilaterally, CTAB RRR,No Gallops,Rubs or new Murmurs, No Parasternal Heave +ve B.Sounds, Abd Soft, Non tender, No organomegaly appriciated, No rebound -guarding or rigidity. No Cyanosis, Clubbing or edema, No  new Rash or bruise   PERTINENT RADIOLOGIC STUDIES: No results found.   PERTINENT LAB RESULTS: CBC: No results for input(s): "WBC", "HGB",  "HCT", "PLT" in the last 72 hours. CMET CMP     Component Value Date/Time   NA 136 08/13/2023 0824   NA 142 09/13/2022 0000   NA 136 06/19/2016 1301   K 3.8 08/13/2023 0824   K 3.8 06/19/2016 1301   CL 106 08/13/2023 0824   CO2 18 (L) 08/13/2023 0824   CO2 31 (H) 06/19/2016 1301   GLUCOSE 101 (H) 08/13/2023 0824   GLUCOSE 105 06/19/2016 1301   BUN 94 (H) 08/13/2023 0824   BUN 56 (A) 09/13/2022 0000   BUN 26.6 (H) 06/19/2016 1301   CREATININE 5.24 (H) 08/13/2023 0824   CREATININE 2.31 (H) 03/10/2020 1149   CREATININE 1.4 (H) 06/19/2016 1301   CALCIUM 7.7 (L) 08/13/2023 0824   CALCIUM 9.8 06/19/2016 1301   PROT 6.7 03/13/2023 1232   PROT 6.6 06/19/2016 1301   ALBUMIN 2.3 (L) 08/10/2023 0541   ALBUMIN 3.5 06/19/2016 1301   AST 22 03/13/2023 1232   AST 22 06/19/2016 1301   ALT 12 03/13/2023 1232   ALT 14 06/19/2016 1301   ALKPHOS 55 03/13/2023 1232   ALKPHOS 37 (L) 06/19/2016 1301   BILITOT 0.6 03/13/2023 1232   BILITOT 0.32 06/19/2016 1301   GFR 10.18 (LL) 01/16/2023 1238   EGFR 10.0 04/19/2023 1206   EGFR 16 11/16/2021 0000   GFRNONAA 7 (L) 08/13/2023 0824   GFRNONAA 19 (L) 03/10/2020 1149    GFR Estimated Creatinine Clearance: 6.9 mL/min (A) (by C-G formula based on SCr of 5.24 mg/dL (H)). No results for input(s): "LIPASE", "AMYLASE" in the last 72 hours. No results for input(s): "CKTOTAL", "CKMB", "CKMBINDEX", "TROPONINI" in the last 72 hours. Invalid input(s): "POCBNP" No results for input(s): "DDIMER" in the last 72 hours. No results for input(s): "HGBA1C" in the last 72 hours. No results for input(s): "CHOL", "HDL", "LDLCALC", "TRIG", "CHOLHDL", "LDLDIRECT" in the last 72 hours. No results for input(s): "TSH", "T4TOTAL", "T3FREE", "THYROIDAB" in the last 72 hours.  Invalid input(s): "FREET3" No results for input(s): "VITAMINB12", "FOLATE", "FERRITIN", "TIBC", "IRON", "RETICCTPCT" in the last 72 hours. Coags: No results for input(s): "INR" in the last 72  hours.  Invalid input(s): "PT" Microbiology: Recent Results (from the past 240 hours)  MRSA Next Gen by PCR, Nasal     Status: None   Collection Time: 08/09/23  5:29 AM   Specimen: Nasal Mucosa; Nasal Swab  Result Value Ref Range Status   MRSA by PCR Next Gen NOT DETECTED NOT DETECTED Final    Comment: (NOTE) The GeneXpert MRSA Assay (FDA approved for NASAL specimens only), is one component of a comprehensive MRSA colonization surveillance program. It is not intended to diagnose MRSA infection nor to guide or monitor treatment for MRSA infections. Test performance is not FDA approved in patients less than 20 years old. Performed at [facility], [address], [place], [place] [address]     FURTHER DISCHARGE INSTRUCTIONS:  Get Medicines reviewed and adjusted: Please take all your medications with you for your next visit with your Primary MD  Laboratory/radiological data: Please request your Primary MD to go over all hospital tests and procedure/radiological results at the follow up, please ask your Primary MD to get all Hospital records sent to his/her office.  In some cases, they will be blood work, cultures and biopsy results pending at the time of your  discharge. Please request that your primary care M.D. goes through all the records of your hospital data and follows up on these results.  Also Note the following: If you experience worsening of your admission symptoms, develop shortness of breath, life threatening emergency, suicidal or homicidal thoughts you must seek medical attention immediately by calling 911 or calling your MD immediately  if symptoms less severe.  You must read complete instructions/literature along with all the possible adverse reactions/side effects for all the Medicines you take and that have been prescribed to you. Take any new Medicines after you have completely understood and accpet all the possible adverse reactions/side effects.   Do not  drive when taking Pain medications or sleeping medications (Benzodaizepines)  Do not take more than prescribed Pain, Sleep and Anxiety Medications. It is not advisable to combine anxiety,sleep and pain medications without talking with your primary care practitioner  Special Instructions: If you have smoked or chewed Tobacco  in the last 2 yrs please stop smoking, stop any regular Alcohol  and or any Recreational drug use.  Wear Seat belts while driving.  Please note: You were cared for by a hospitalist during your hospital stay. Once you are discharged, your primary care physician will handle any further medical issues. Please note that NO REFILLS for any discharge medications will be authorized once you are discharged, as it is imperative that you return to your primary care physician (or establish a relationship with a primary care physician if you do not have one) for your post hospital discharge needs so that they can reassess your need for medications and monitor your lab values.  Total Time spent coordinating discharge including counseling, education and face to face time equals greater than 30 minutes.  SignedJeoffrey Massed 08/13/2023 4:03 PM

## 2023-08-13 NOTE — Care Management Important Message (Signed)
 Important Message  Patient Details  Name: [name] MRN: [id number] Date of Birth: April 10, 1934   Important Message Given:  Yes - Medicare IM     Dorena Bodo 08/13/2023, 4:24 PM

## 2023-08-13 NOTE — Consult Note (Signed)
 Palliative Medicine Inpatient Consult Note  Consulting Provider: Maretta Bees, MD   Reason for consult:   Palliative Care Consult Services Palliative Medicine Consult  Reason for Consult? goc   08/13/2023  HPI:  Per intake H&P --> Patient is a 89 y.o.  female with history of HTN, DM-2, HFpEF, CKD 5, normocytic anemia-who presented with shortness of breath-she was found to have AKI on CKD 5 along with HFpEF exacerbation.    Palliative care has been asked to support additional goals of care conversations.   Clinical Assessment/Goals of Care:  *Please note that this is a verbal dictation therefore any spelling or grammatical errors are due to the "Dragon Medical One" system interpretation.  I have reviewed medical records including EPIC notes, labs and imaging, received report from bedside RN, assessed the patient who is lying in bed in NAD.    I met with patients daughters, Mearl Latin, and granddaughter, [name] to further discuss diagnosis prognosis, GOC, EOL wishes, disposition and options.   I introduced Palliative Medicine as specialized medical care for people living with serious illness. It focuses on providing relief from the symptoms and stress of a serious illness. The goal is to improve quality of life for both the patient and the family.  Medical History Review and Understanding:  A review of patient's past medical history complete significant for type 2 diabetes, heart failure, chronic kidney disease stage V, hypertension, and normocytic anemia.  Social History:  Auriel is from [facility].  She shares that she was born in [facility] [place].  Conner is not married.  She has 5 living children, 13 grandchildren, and numerous great-grandchildren.  Rosemaria is the "mother" of her whole [employer] as well as the Psychologist, occupational.  She shares he gets great enjoyment out of bending time with her family.  Functional and Nutritional State:  Preceding  hospitalization Adamary was living with her granddaughter, Therese Sarah in an apartment.  She shares that she was able to do most B ADLs for herself.  She does use both a cane and a walker.  She eats well some days will not so well other days and her appetite is identified as variable.  Advance Directives:  A detailed discussion was had today regarding advanced directives.  Jenniefer does not have advanced directives though we were able to discuss these and a copy provided for review and completion among she and her family.  Code Status:  Concepts specific to code status, artifical feeding and hydration, continued IV antibiotics and rehospitalization was had.  The difference between a aggressive medical intervention path  and a palliative comfort care path for this patient at this time was had.   Encouraged patient/family to consider DNR/DNI status understanding evidenced based poor outcomes in similar hospitalized patient, as the cause of arrest is likely associated with advanced chronic/terminal illness rather than an easily reversible acute cardio-pulmonary event. I explained that DNR/DNI does not change the medical plan and it only comes into effect after a person has arrested (died).  It is a protective measure to keep [place] from harming the patient in their last moments of life.   At this time discussions regarding resuscitation were a lot to digest and Karlin would like to take time to speak more to her family about this before making a decision.  Discussion:  Jezabella shares awareness that she has chronic kidney disease and she as well as her granddaughter have met and spoken to Dr. [name].  Omie herself  shares that she would not want hemodialysis even if it were an option.  We did review the potential impact that this could have on her quality of life.  We discussed Kristelle's heart failure and the chronic progressive nature of the disease.  I shared the different stages and what end-stage heart failure looks  like.  We reviewed the relation ship between the heart and kidneys and how they affect one another.   Discussed the importance of talking to family regarding what quality of life is to [facility] and what would and would not be an acceptable quality of life.  I was able to review the differences between palliative care and hospice care and I provided a handout to patient's family for further clarification.  I also provided the hard choices for loving people book as a reference.  Jeniah understands the importance of considering what she would want in her future as well as what is important to her as an individual.  She understands the importance of reflecting those wishes to the rest of her family.  Naveya has a rather large family and the plan is for all of them to get together to determine what her wishes are moving into the future.  Riah and her family endorse that her polypharmacy has had a profound effect on her.  I shared with her that often some medications can be removed by her primary care provider though when she is at this stage of disease processes often the medications are for maintenance.  I was able to make it clear that it is important that she adheres to her medication regimen otherwise her days may be more limited and/or she may be rehospitalized.  At this point in time patient's family are agreeable and accepting of outpatient palliative support for further goals of care conversations.  Discussed the importance of continued conversation with family and their  medical providers regarding overall plan of care and treatment options, ensuring decisions are within the context of the patients values and GOCs.  Decision Maker: Shontelle, Muska (Granddaughter): [phone] (Mobile)   SUMMARY OF RECOMMENDATIONS   Full Code / Full Scope of Care --> patient would like time and Delorise Shiner to consider this further  Ongoing support  TOC referral for outpatient Palliative support on  discharge  Plan for discharge home this late afternoon  Code Status/Advance Care Planning: FULL CODE  Palliative Prophylaxis:  Aspiration, Bowel Regimen, Delirium Protocol, Frequent Pain Assessment, Oral Care, Palliative Wound Care, and Turn Reposition  Additional Recommendations (Limitations, Scope, Preferences): Continue present measures  Psycho-social/Spiritual:  Desire for further Chaplaincy support: Patient has excellent support through her [employer] Additional Recommendations: Education on chronic disease burden   Prognosis: Multiple chronic comorbidities place patient at a higher 55-month mortality risk.  Discharge Planning: Discharge patient once medically optimized -this evening.  Vitals:   08/13/23 0454 08/13/23 0940  BP: (!) 180/81 (!) 181/75  Pulse:  64  Resp:    Temp:    SpO2:     No intake or output data in the 24 hours ending 08/13/23 1617 Last Weight  Most recent update: 08/13/2023  4:58 AM    Weight  65.9 kg (145 lb 4.5 oz)            Gen: Elderly African-American female in no acute distress HEENT: moist mucous membranes CV: Regular rate and rhythm PULM: On room air breathing is even and nonlabored ABD: soft/nontender EXT: No edema Neuro: Alert and oriented x3  PPS: 50-60%   This  conversation/these recommendations were discussed with patient primary care team, Dr. Jerral Ralph  Billing based on MDM: High ______________________________________________________ Lamarr Lulas [facility] Palliative Medicine Team Team Cell Phone: [phone] Please utilize secure chat with additional questions, if there is no response within 30 minutes please call the above phone number  Palliative Medicine Team providers are available by phone from 7am to 7pm daily and can be reached through the team cell phone.  Should this patient require assistance outside of these hours, please call the patient's attending physician.

## 2023-08-13 NOTE — Plan of Care (Signed)
  Problem: Clinical Measurements: Goal: Ability to maintain clinical measurements within normal limits will improve Outcome: Progressing Goal: Will remain free from infection Outcome: Progressing   Problem: Coping: Goal: Level of anxiety will decrease Outcome: Progressing   Problem: Safety: Goal: Ability to remain free from injury will improve Outcome: Progressing   

## 2023-08-13 NOTE — TOC Transition Note (Signed)
 Transition of Care [facility]) - Discharge Note   Patient Details  Name: [name] MRN: [id number] Date of Birth: 1933-09-25  Transition [facility] CM/SW Contact:  Mearl Latin, LCSW Phone Number: 08/13/2023, 4:14 PM   Clinical Narrative:    CSW received consult for OP Palliative care. CSW spoke with [facility] and provided choice. She chose [facility]. CSW sent referral and added to AVS.    Final next level of care: Home/Self Care Barriers to Discharge: Barriers Resolved   Patient Goals and CMS Choice Patient states their goals for this hospitalization and ongoing recovery are:: to work on breathing and her kidneys          Discharge Placement                  Name of family member notified: [name] Patient and family notified of of transfer: 08/13/23  Discharge Plan and Services Additional resources added to the After Visit Summary for     Discharge Planning Services: CM Consult                                 Social Drivers of Health (SDOH) Interventions SDOH Screenings   Food Insecurity: Food Insecurity Present (08/08/2023)  Housing: Low Risk  (08/08/2023)  Transportation Needs: No Transportation Needs (08/08/2023)  Utilities: Not At Risk (08/08/2023)  Depression (PHQ2-9): Low Risk  (01/16/2023)  Financial Resource Strain: Low Risk  (04/14/2022)  Social Connections: Moderately Isolated (08/08/2023)  Tobacco Use: Low Risk  (08/08/2023)     Readmission Risk Interventions    08/08/2023    3:21 PM 11/02/2022   12:32 PM  Readmission Risk Prevention Plan  Transportation Screening  Complete  Medication Review (RN Care Manager) Complete Complete  PCP or Specialist appointment within 3-5 days of discharge Complete Complete  HRI or Home Care Consult Complete Complete  SW Recovery Care/Counseling Consult  Complete  Palliative Care Screening  Not Applicable  Skilled Nursing Facility Complete Not Applicable

## 2023-08-13 NOTE — Telephone Encounter (Unsigned)
 Copied from CRM [id number]. Topic: General - Other >> Aug 13, 2023  5:09 PM Shereese L wrote: Reason for CRM: Star from [employer] called stating that the patient will be starting Palliative care 08/14/23

## 2023-08-13 NOTE — Progress Notes (Signed)
 Occupational Therapy Treatment Patient Details Name: [name] MRN: [id number] DOB: 1934-04-15 Today's Date: 08/13/2023   History of present illness 89 y.o. female presents to [facility] 08/07/23 with acute asthma exacerbation and LE edema. CT chest showed mild pulmonary edema, effusion, and patchy airspace opacities within lower lobes bilaterally. Possible PNA, AKI superimposed on CKD V, acute on chronic CHF. PMH: HTN, HLD, asthma, CKD stage V, DM2, CKD, lupus, GERD.   OT comments  Patient seen for continuation of advancement of activity tolerance and independence with ADL management. Patient CGA for toilet transfer and ADLs, with VSS throughout. Patient reporting fatigue after toilet transfer and brief ambulation, and did not want to continue further. OT recommendation remains appropriate, will continue to follow.       If plan is discharge home, recommend the following:  A little help with walking and/or transfers;A little help with bathing/dressing/bathroom;Assistance with cooking/housework;Direct supervision/assist for medications management;Direct supervision/assist for financial management;Assist for transportation;Supervision due to cognitive status;Help with stairs or ramp for entrance (initially)   Equipment Recommendations  Tub/shower bench    Recommendations for Other Services      Precautions / Restrictions Precautions Precautions: Fall Precaution/Restrictions Comments: compression stocking Restrictions Weight Bearing Restrictions Per Provider Order: No       Mobility Bed Mobility Overal bed mobility: Needs Assistance Bed Mobility: Supine to Sit     Supine to sit: Contact guard     General bed mobility comments: CGA for safety    Transfers Overall transfer level: Needs assistance Equipment used: Rolling walker (2 wheels) Transfers: Sit to/from Stand Sit to Stand: Contact guard assist           General transfer comment: cues for hand placement,  supervision for safety with RW, able to transfer to bathroom (BSC over toilet) and use hand rails to come up into standing     Balance Overall balance assessment: Needs assistance, Mild deficits observed, not formally tested Sitting-balance support: No upper extremity supported, Feet supported Sitting balance-Leahy Scale: Good     Standing balance support: Bilateral upper extremity supported, During functional activity, Reliant on assistive device for balance Standing balance-Leahy Scale: Poor Standing balance comment: reliant on RW                           ADL either performed or assessed with clinical judgement   ADL Overall ADL's : Needs assistance/impaired Eating/Feeding: Set up;Sitting   Grooming: Wash/dry hands;Set up;Sitting           Upper Body Dressing : Contact guard assist;Sitting       Toilet Transfer: Contact guard assist;Ambulation;Rolling walker (2 wheels) Toilet Transfer Details (indicate cue type and reason): BSC over top Toileting- Clothing Manipulation and Hygiene: Contact guard assist;Sitting/lateral lean;Sit to/from stand       Functional mobility during ADLs: Contact guard assist;Cueing for safety;Cueing for sequencing;Rolling walker (2 wheels) General ADL Comments: Patient seen for continuation of advancement of activity tolerance and independence with ADL management. Patient CGA for toilet transfer and ADLs, with VSS throughout. Patient reporting fatigue after toilet transfer and brief ambulation, and did not want to continue further. OT recommendation remains appropriate, will continue to follow.    Extremity/Trunk Assessment         Cervical / Trunk Assessment Cervical / Trunk Assessment: Normal    Vision Baseline Vision/History: 0 No visual deficits Ability to See in Adequate Light: 0 Adequate Patient Visual Report: No change from baseline Vision Assessment?: No apparent  visual deficits   Perception Perception Perception: Not  tested   Praxis Praxis Praxis: Not tested   Communication Communication Communication: No apparent difficulties   Cognition Arousal: Alert Behavior During Therapy: WFL for tasks assessed/performed Cognition: Cognition impaired       Memory impairment (select all impairments): Short-term memory Attention impairment (select first level of impairment): Selective attention Executive functioning impairment (select all impairments): Problem solving, Sequencing OT - Cognition Comments: Appears to be at her baseline with minor STM deficits noted                 Following commands: Impaired Following commands impaired: Follows one step commands with increased time, Only follows one step commands consistently      Cueing   Cueing Techniques: Verbal cues  Exercises      Shoulder Instructions       General Comments VSS on RA    Pertinent Vitals/ Pain       Pain Assessment Pain Assessment: Faces Pain Score: 0-No pain Pain Intervention(s): Limited activity within patient's tolerance, Monitored during session, Repositioned  Home Living Family/patient expects to be discharged to:: Private residence Living Arrangements: Other relatives;Children Available Help at Discharge: Family;Available 24 hours/day Type of Home: House Home Access: Level entry     Home Layout: One level     Bathroom Shower/Tub: Chief Strategy Officer: Standard     Home Equipment: Cane - single point;Educational psychologist (4 wheels)          Prior Functioning/Environment              Frequency  Min 2X/week        Progress Toward Goals  OT Goals(current goals can now be found in the care plan section)  Progress towards OT goals: Progressing toward goals  Acute Rehab OT Goals Patient Stated Goal: to get better OT Goal Formulation: With patient Time For Goal Achievement: 08/24/23 Potential to Achieve Goals: Good  Plan      Co-evaluation                  AM-PAC OT "6 Clicks" Daily Activity     Outcome Measure   Help from another person eating meals?: A Little Help from another person taking care of personal grooming?: A Little Help from another person toileting, which includes using toliet, bedpan, or urinal?: A Little Help from another person bathing (including washing, rinsing, drying)?: A Little Help from another person to put on and taking off regular upper body clothing?: A Little Help from another person to put on and taking off regular lower body clothing?: A Little 6 Click Score: 18    End of Session Equipment Utilized During Treatment: Rolling walker (2 wheels)  OT Visit Diagnosis: Unsteadiness on feet (R26.81);Other abnormalities of gait and mobility (R26.89);Muscle weakness (generalized) (M62.81);Other symptoms and signs involving cognitive function   Activity Tolerance Patient tolerated treatment well   Patient Left with call bell/phone within reach;in chair;with chair alarm set   Nurse Communication Mobility status        Time: 1610-9604 OT Time Calculation (min): 25 min  Charges: OT General Charges $OT Visit: 1 Visit OT Treatments $Self Care/Home Management : 23-37 mins  Pollyann Glen E. Muskaan Smet, OTR/L Acute Rehabilitation Services [phone]   Cherlyn Cushing 08/13/2023, 10:07 AM

## 2023-08-13 NOTE — Progress Notes (Signed)
 Physical Therapy Treatment Patient Details Name: [name] MRN: [id number] DOB: 18-Dec-1933 Today's Date: 08/13/2023   History of Present Illness 89 y.o. female presents to [facility] 08/07/23 with acute asthma exacerbation and LE edema. CT chest showed mild pulmonary edema, effusion, and patchy airspace opacities within lower lobes bilaterally. Possible PNA, AKI superimposed on CKD V, acute on chronic CHF. PMH: HTN, HLD, asthma, CKD stage V, DM2, CKD, lupus, GERD.    PT Comments  Patient is agreeable to PT session. She was already sitting up in chair on arrival to room. CGA provided for transfers with cues for hand placement for safety. Short distance ambulation performed with CGA using rolling walker with reinforcement of using rolling walker for safety and fall prevention. Activity tolerance limited by fatigue. Recommend to continue PT to maximize independence and decrease caregiver burden.    If plan is discharge home, recommend the following: A little help with walking and/or transfers;A little help with bathing/dressing/bathroom;Assist for transportation;Help with stairs or ramp for entrance;Assistance with cooking/housework   Can travel by private vehicle        Equipment Recommendations  None recommended by PT    Recommendations for Other Services       Precautions / Restrictions Precautions Precautions: Fall Restrictions Weight Bearing Restrictions Per Provider Order: No     Mobility  Bed Mobility               General bed mobility comments: not assessed as patient sitting up on arrival and post session    Transfers Overall transfer level: Needs assistance Equipment used: Rolling walker (2 wheels) Transfers: Sit to/from Stand Sit to Stand: Contact guard assist           General transfer comment: cues for hand placement for safety    Ambulation/Gait Ambulation/Gait assistance: Contact guard assist Gait Distance (Feet): 25 Feet Assistive device: Rolling  walker (2 wheels) Gait Pattern/deviations: Step-through pattern, Trunk flexed, Narrow base of support Gait velocity: decreased     General Gait Details: slow but steady with rolling walker for safety. encouraged patient to continue using rolling walker for safety. activity tolerance limited by fatigue.   Stairs             Wheelchair Mobility     Tilt Bed    Modified Rankin (Stroke Patients Only)       Balance Overall balance assessment: Needs assistance, Mild deficits observed, not formally tested Sitting-balance support: No upper extremity supported, Feet supported Sitting balance-Leahy Scale: Good     Standing balance support: Bilateral upper extremity supported, During functional activity, Reliant on assistive device for balance Standing balance-Leahy Scale: Poor Standing balance comment: reliant on RW                            Communication Communication Communication: No apparent difficulties  Cognition Arousal: Alert Behavior During Therapy: WFL for tasks assessed/performed   PT - Cognitive impairments: No family/caregiver present to determine baseline, Memory, Problem solving, Safety/Judgement                         Following commands: Impaired Following commands impaired: Follows one step commands with increased time, Only follows one step commands consistently    Cueing Cueing Techniques: Verbal cues  Exercises General Exercises - Lower Extremity Ankle Circles/Pumps: AROM, Strengthening, Both, 10 reps, Seated Long Arc Quad: AAROM, Strengthening, Both, 5 reps, Seated Hip ABduction/ADduction: AAROM, Strengthening, Both, 10 reps,  Seated Other Exercises Other Exercises: verbal cues for technique    General Comments General comments (skin integrity, edema, etc.): vitals stable on room air      Pertinent Vitals/Pain Pain Assessment Pain Assessment: No/denies pain    Home Living Family/patient expects to be discharged to::  Private residence Living Arrangements: Other relatives;Children Available Help at Discharge: Family;Available 24 hours/day Type of Home: House Home Access: Level entry       Home Layout: One level Home Equipment: Cane - single point;Educational psychologist (4 wheels)      Prior Function            PT Goals (current goals can now be found in the care plan section) Acute Rehab PT Goals Patient Stated Goal: to go home PT Goal Formulation: With patient Time For Goal Achievement: 08/23/23 Potential to Achieve Goals: Good Progress towards PT goals: Progressing toward goals    Frequency    Min 1X/week      PT Plan      Co-evaluation              AM-PAC PT "6 Clicks" Mobility   Outcome Measure  Help needed turning from your back to your side while in a flat bed without using bedrails?: A Little Help needed moving from lying on your back to sitting on the side of a flat bed without using bedrails?: A Little Help needed moving to and from a bed to a chair (including a wheelchair)?: A Little Help needed standing up from a chair using your arms (e.g., wheelchair or bedside chair)?: A Little Help needed to walk in hospital room?: A Little Help needed climbing 3-5 steps with a railing? : A Little 6 Click Score: 18    End of Session   Activity Tolerance: Patient tolerated treatment well Patient left: in chair;with call bell/phone within reach;with chair alarm set Nurse Communication: Mobility status PT Visit Diagnosis: Other abnormalities of gait and mobility (R26.89)     Time: 4098-1191 PT Time Calculation (min) (ACUTE ONLY): 16 min  Charges:    $Therapeutic Activity: 8-22 mins PT General Charges $$ ACUTE PT VISIT: 1 Visit                     Donna Bernard, PT, MPT    [name] 08/13/2023, 1:11 PM

## 2023-08-13 NOTE — Progress Notes (Signed)
 This chaplain responded to the unit page for Pt. spiritual care in the setting of major life transition. The Pt. is sitting in the bedside recliner at the time of the visit.   The chaplain listened reflectively as the Pt. shared how this admission differs from her independence at home. The Pt. is aware of the complexity of kidney and heart disease and trusts the [name] in his decisions.  The Pt. is supported by children and siblings. All of which have visited the Pt. The Pt accepted the chaplain's invitation for prayer and F/U spiritual care as needed.  Chaplain Stephanie Acre [phone]

## 2023-08-14 ENCOUNTER — Telehealth: Payer: Self-pay

## 2023-08-14 ENCOUNTER — Other Ambulatory Visit: Payer: Self-pay | Admitting: Family

## 2023-08-14 ENCOUNTER — Inpatient Hospital Stay

## 2023-08-14 NOTE — Transitions of Care (Post Inpatient/ED Visit) (Addendum)
 08/14/2023  Name: [name] MRN: [id number] DOB: February 15, 1934  Today's TOC FU Call Status: Today's TOC FU Call Status:: Successful TOC FU Call Completed TOC FU Call Complete Date: 08/14/23 Patient's Name and Date of Birth confirmed.  Transition Care Management Follow-up Telephone Call Date of Discharge: 08/13/23 Discharge Facility: [facility]) Type of Discharge: Inpatient Admission Primary Inpatient Discharge Diagnosis:: ESRD, CHF How have you been since you were released from the hospital?: Same (Granddaughter reports light headed last night, sleeping this am.) Any questions or concerns?: No  Items Reviewed: Did you receive and understand the discharge instructions provided?: Yes Medications obtained,verified, and reconciled?: Yes (Medications Reviewed) Any new allergies since your discharge?: No Dietary orders reviewed?: Yes Type of Diet Ordered:: low salt heart healthy Do you have support at home?: Yes People in Home: grandchild(ren) Name of Support/Comfort Primary Source: [employer]  Medications Reviewed Today: Medications Reviewed Today     Reviewed by Earlie Server, RN (Registered Nurse) on 08/14/23 at [phone]  Med List Status: <None>   Medication Order Taking? Sig Documenting Provider Last Dose Status Informant  acetaminophen (TYLENOL) 500 MG tablet [id number] Yes Take 500 mg by mouth 2 (two) times daily as needed for mild pain (pain score 1-3) or headache. [provider] Taking Active Family Member, Pharmacy Records  albuterol (PROVENTIL) (2.5 MG/3ML) 0.083% nebulizer solution [id number] Yes Take 3 mLs (2.5 mg total) by nebulization every 6 (six) hours as needed for shortness of breath or wheezing. Carlisle Beers, FNP Taking Active Family Member, Pharmacy Records  albuterol (VENTOLIN HFA) 108 (90 Base) MCG/ACT inhaler [id number] Yes USE 2 PUFFS EVERY 6 HOURS AS NEEDED FOR WHEEZING. Leslye Peer, MD Taking Active Family Member, Pharmacy Records            Med Note (LEE, NICOLE   Wed Aug 08, 2023  3:31 AM) Rescue inhaler  amLODipine (NORVASC) 10 MG tablet [id number] Yes Take 1 tablet (10 mg total) by mouth daily.  Patient taking differently: Take 10 mg by mouth at bedtime.   Osvaldo Shipper, MD Taking Active Family Member, Pharmacy Records  atropine 1 % ophthalmic solution [id number] Yes Place 1 drop into both eyes 2 (two) times daily as needed. [provider] Taking Active Family Member, Pharmacy Records  Azelastine-Fluticasone 137-50 MCG/ACT SUSP [id number] Yes PLACE ONE SPRAY IN EACH NOSTRIL TWICE DAILY (IN THE MORNING AND AT BEDTIME)  Patient taking differently: Place 1 spray into both nostrils in the morning and at bedtime.   Leslye Peer, MD Taking Active Family Member, Pharmacy Records  Budeson-Glycopyrrol-Formoterol [facility] AEROSPHERE) 160-9-4.8 MCG/ACT Sandrea Matte [id number] Yes INHALE 2 PUFFS INTO THE LUNGS TWICE DAILY, IN THE MORNING & AT BEDTIME. Leslye Peer, MD Taking Active Family Member, Pharmacy Records  carvedilol (COREG) 12.5 MG tablet [id number] Yes Take 1 tablet (12.5 mg total) by mouth 2 (two) times daily. Nahser, Deloris Ping, MD Taking Active Family Member, Pharmacy Records  cetirizine (ZYRTEC) 10 MG tablet [id number] Yes Take 10 mg by mouth daily as needed for allergies or rhinitis. [provider] Taking Active Family Member, Pharmacy Records  citalopram (CELEXA) 20 MG tablet [id number] Yes Take 0.5 tablets (10 mg total) by mouth daily. Panosh, Neta Mends, MD Taking Active Family Member, Pharmacy Records  cloNIDine (CATAPRES) 0.1 MG tablet [id number] No TAKE ONE TABLET BY MOUTH TWICE DAILY AS NEEDED for hypertension  Patient not taking: Reported on 08/14/2023   Panosh, Neta Mends, MD Not Taking Active Family Member, Pharmacy Records  diclofenac  Sodium (VOLTAREN) 1 % GEL [id number] Yes Apply 4 g topically 2 (two) times daily as needed (painful areas). Ellsworth Lennox, PA-C Taking Active Family Member, Pharmacy Records   ferrous sulfate 325 (65 FE) MG EC tablet [id number] Yes Take 1 tablet (325 mg total) by mouth daily with breakfast. Panosh, Neta Mends, MD Taking Active Family Member, Pharmacy Records  fluticasone [facility]) 50 MCG/ACT nasal spray [id number] Yes Place 2 sprays into both nostrils as needed for allergies or rhinitis. [provider] Taking Active Family Member, Pharmacy Records  hydrALAZINE (APRESOLINE) 100 MG tablet [id number] Yes Take 1 tablet (100 mg total) by mouth 3 (three) times daily. Nahser, Deloris Ping, MD Taking Active Family Member, Pharmacy Records  isosorbide mononitrate (IMDUR) 30 MG 24 hr tablet [id number] Yes Take 1 tablet (30 mg total) by mouth daily. Milford, Anderson Malta, FNP Taking Active Family Member, Pharmacy Records  LORazepam (ATIVAN) 0.5 MG tablet [id number] Yes Take 1 tablet (0.5 mg total) by mouth 2 (two) times daily as needed. for anxiety Eulis Foster, FNP Taking Active Family Member, Pharmacy Records  montelukast (SINGULAIR) 10 MG tablet [id number] Yes Take 1 tablet (10 mg total) by mouth daily.  Patient taking differently: Take 10 mg by mouth at bedtime.   Leslye Peer, MD Taking Active Family Member, Pharmacy Records  oxyCODONE (OXY IR/ROXICODONE) 5 MG immediate release tablet [id number] No Take 1 tablet (5 mg total) by mouth every 6 (six) hours as needed for severe pain (pain score 7-10).  Patient not taking: Reported on 08/14/2023   Panosh, Neta Mends, MD Not Taking Active Family Member, Pharmacy Records  pantoprazole (PROTONIX) 40 MG tablet [id number] Yes Take 1 tablet (40 mg total) by mouth daily.  Patient taking differently: Take 40 mg by mouth 2 (two) times daily.   Leslye Peer, MD Taking Active Family Member, Pharmacy Records  RESTASIS 0.05 % ophthalmic emulsion [id number] Yes Place 1 drop into both eyes 2 (two) times daily. [provider] Taking Active Family Member, Pharmacy Records  sodium bicarbonate 650 MG tablet [id number] Yes Take 2 tablets (1,300 mg  total) by mouth 2 (two) times daily. Maretta Bees, MD Taking Active   Spacer/Aero-Holding Chambers (AEROCHAMBER PLUS) inhaler [id number] No Use as instructed  Patient not taking: Reported on 08/08/2023   Domenick Gong, MD Not Taking Active Family Member, Pharmacy Records  torsemide 40 MG TABS [id number] No Take 40 mg by mouth daily.  Patient not taking: Reported on 08/14/2023   Maretta Bees, MD Not Taking Active            Med Note (ROSE, Haliyah Fryman U   Tue Aug 14, 2023  9:41 AM) Will pick up today  vitamin B-12 (CYANOCOBALAMIN) 1000 MCG tablet [id number] Yes Take 1,000 mcg by mouth daily. [provider] Taking Active Family Member, Pharmacy Records  Vitamin D, Cholecalciferol, 25 MCG (1000 UT) TABS [id number] Yes Take 1,000 Units by mouth daily. Eulis Foster, FNP Taking Active Family Member, Pharmacy Records            Home Care and Equipment/Supplies: Were Home Health Services Ordered?: Yes (Palliative) Name of Home Health Agency:: [facility] Has Agency set up a time to come to your home?: No Any new equipment or medical supplies ordered?: No  Functional Questionnaire: Do you need assistance with bathing/showering or dressing?: No Do you need assistance with meal preparation?: Yes (family) Do you need assistance with eating?: No Do you have difficulty maintaining continence: No Do you need assistance with  getting out of bed/getting out of a chair/moving?: Yes (use walker and cane) Do you have difficulty managing or taking your medications?: Yes (grand daughter)  Follow up appointments reviewed: PCP Follow-up appointment confirmed?: No (Grand daughter to call and make appointment) MD Provider Line [phone] Given: No Specialist Hospital Follow-up appointment confirmed?: NA Do you need transportation to your follow-up appointment?: No Do you understand care options if your condition(s) worsen?: Yes-patient verbalized understanding  SDOH Interventions  Today    Flowsheet Row Most Recent Value  SDOH Interventions   Food Insecurity Interventions Intervention Not Indicated  Housing Interventions Intervention Not Indicated  Transportation Interventions Intervention Not Indicated  Utilities Interventions Intervention Not Indicated       Goals       Care coordination Activities-No follow up required      Care Coordination Interventions: Evaluation of current treatment plan related to pneumonia and patient's adherence to plan as established by provider Advised patient to call physician for any signs of pneumonia coming back.  Reviewed scheduled/upcoming provider appointments including Pulmonologist and Nephrologist  Spoke with granddaughter [name]. She states patient is doing good. Slowly getting better. Appetite improving.  Patient remains independent and still drives.   Discussed THN support.  She declined further follow up at this time.        TOC- Patient and or grandaughter will report no readmission in the next 30 days. (pt-stated)      Current Barriers:  Provider appointments no PCP follow up scheduled yet Home Health services pending start of case with [facility] CHF and renal failure- refused dialysis  RNCM Clinical Goal(s):  Patient will work with the Care Management team over the next 30 days to address Transition of Care Barriers: Provider appointments Home Health services take all medications exactly as prescribed and will call provider for medication related questions as evidenced by grand daughter report attend all scheduled medical appointments: PCP as evidenced by review of EMR and patient/ granddaughter report.  work with Geologist, engineering to develop plan of care  as evidenced by grand daughter report  through collaboration with Medical illustrator, provider, and care team.   Interventions: Evaluation of current treatment plan related to  self management and patient's adherence to plan as established by  provider  Transitions of Care:  New goal. Doctor Visits  - discussed the importance of doctor visits  Heart Failure Interventions:  (Status:  New goal.) Short Term Goal Provided education on low sodium diet Assessed social determinant of health barriers    Chronic Kidney Disease Interventions:  (Status:  New goal.) Short Term Goal Assessed social determinant of health barriers    Reviewed pending palliative consult Last practice recorded BP readings:  BP Readings from Last 3 Encounters:  08/13/23 (!) 181/75  08/07/23 (!) 170/78  07/17/23 (!) 140/62   Most recent eGFR/CrCl:  Lab Results  Component Value Date   EGFR 10.0 04/19/2023    No components found for: "CRCL"  Patient Goals/Self-Care Activities: Participate in Transition of Care Program/Attend [facility] scheduled calls Notify RN Care Manager of TOC call rescheduling needs Take all medications as prescribed Attend all scheduled provider appointments Call provider office for new concerns or questions  know when to call the doctor:increased shortness of breath  Follow Up Plan:  Telephone follow up appointment with care management team member scheduled for:  08/21/2023 The patient has been provided with contact information for the care management team and has been advised to call with any health related questions or concerns.  Long discussion with Tenicia Gural ( granddaughter on the HIPAA form) .  Patient lives with granddaughter in a first level apartment.  Granddaughter provides care.  Granddaughter works until [employer].  During the day other family members provide care.  Patient has refused dialysis.  Referred to [facility] care palliative care.   Granddaughter is managing medications.  Reports that patient is able to dress herself and feed herself and  prepare a simple meal and coffee.  Patient uses a walker and or cane if needed.  Reviewed all discharge instructions and medications in depth with Granddaughter.   Reviewed  and offered 30 day TOC program and grand daughter has accepted.  Provided my contact information for grand daughter to call me if needed.   Lonia Chimera, RN, BSN, CEN [employer]- Transition of Care Team.  [facility] [phone]

## 2023-08-15 ENCOUNTER — Other Ambulatory Visit: Payer: Self-pay | Admitting: Internal Medicine

## 2023-08-16 ENCOUNTER — Encounter: Payer: Self-pay | Admitting: Internal Medicine

## 2023-08-16 NOTE — Telephone Encounter (Signed)
 also make sure she can get a nephrology fu  asap as OP since she had acute renal decompensation  and worsening .

## 2023-08-16 NOTE — Telephone Encounter (Signed)
 Reach out to pt's grandduaghter, [name]. She reports pt has appt with Dr. [name], nephrologist but unable to recall the appt date. Granddaughter states she is at work and will call back after done with work.

## 2023-08-16 NOTE — Telephone Encounter (Signed)
 Reach out to [facility], pt's grandaughter, to schedule an appointment.   Inform her the earliest we have is 08/28/23 at 3:30pm.   Granddaughter is fine with it. Appt is made for 08/28/2023 at 330pm.   Forwarding to provider for fyi.

## 2023-08-21 ENCOUNTER — Other Ambulatory Visit: Payer: Self-pay

## 2023-08-21 NOTE — Patient Outreach (Signed)
 Care Management  Transitions of Care Program Transitions of Care Post-discharge week 2  08/21/2023 Name: [name] MRN: [id number] DOB: 31-May-1933  Subjective: [name] is a 89 y.o. year old female who is a primary care patient of Panosh, Neta Mends, MD. The Care Management team was unable to reach the patient by phone to assess and address transitions of care needs.   Plan: Additional outreach attempts will be made to reach the patient enrolled in the [facility] Program (Post Inpatient/ED Visit).  Lonia Chimera, RN, BSN, CEN [employer]- Transition of Care Team.  [facility] [phone]

## 2023-08-22 ENCOUNTER — Telehealth: Payer: Self-pay

## 2023-08-22 NOTE — Patient Outreach (Signed)
 Transition of Care week 2  Visit Note  08/22/2023  Name: [name] MRN: [id number]          DOB: 1934/03/12  Situation: Patient enrolled in [facility] 30-day program. Visit completed with granddaughter by telephone.   Background: Phone call with granddaughter-  Granddaughter reports that patient seems better. Reports 24/7 care provided by family.  Granddaughter reports breathing is improved. Slight swelling. Reports BP is improved. ( Unable to provide readings at this time as granddaughter is at work) Granddaughter reports increase in appetite.   Initial Transition Care Management Follow-up Telephone Call    Past Medical History:  Diagnosis Date   Acute superficial venous thrombosis of left lower extremity 03/27/2014   concern about hx and potential extensive on exam tenderness calf and low dose lovenox 40 qd 2-4 weekselevetion and close  fu advised .    Allergy    Anemia    Anxiety    Arthritis    "shoulders" (09/09/2012)   Asthma 09/08/2012   Carotid bruit    [place] 2018  low risk 1 - 39%   Cataract    bil cateracts removed   Chronic kidney disease    Clotting disorder (HCC)    blood clots in legs   Diabetes mellitus without complication (HCC)    "Borderline" per pt; being monitored.  no meds per pt   Diverticulosis    Dyspnea 04/27/2014   GERD (gastroesophageal reflux disease)    Headache(784.0)    "related to my high blood pressure" (09/09/2012)   Hiatal hernia    History of DVT (deep vein thrombosis)    "RLE" (09/09/2012) coumadine cant take asa so on plavix    HTN (hypertension) 09/08/2012   Hyperlipidemia    Lupus    "cured years ago" (09/09/2012)   Other dysphagia 02/11/2013   Syncope and collapse 01/21/2018   Varicose veins    Varicose veins of leg with complications 12/05/2010    Assessment: Patient Reported Symptoms:  Cognitive Granddaughter reports pleasantly confused  Neurological Other: grand daughter reports worsening memory  HEENT No symptoms  reported    Cardiovascular Swelling in legs or feet (grand daughter reports slight sweling in legs)    Respiratory   Grand daughter reports patients breathing is better. Reports appetite is better as well  Endocrine No symptoms reported    Gastrointestinal No symptoms reported    Genitourinary      Integumentary      Musculoskeletal No symptoms reported    Psychosocial       There were no vitals filed for this visit.  Medications Reviewed Today     Reviewed by Earlie Server, RN (Registered Nurse) on 08/22/23 at 1108  Med List Status: <None>   Medication Order Taking? Sig Documenting Provider Last Dose Status Informant  acetaminophen (TYLENOL) 500 MG tablet [id number] No Take 500 mg by mouth 2 (two) times daily as needed for mild pain (pain score 1-3) or headache. [provider] Taking Active Family Member, Pharmacy Records  albuterol (PROVENTIL) (2.5 MG/3ML) 0.083% nebulizer solution [id number] No Take 3 mLs (2.5 mg total) by nebulization every 6 (six) hours as needed for shortness of breath or wheezing. Carlisle Beers, FNP Taking Active Family Member, Pharmacy Records  albuterol (VENTOLIN HFA) 108 (90 Base) MCG/ACT inhaler [id number] No USE 2 PUFFS EVERY 6 HOURS AS NEEDED FOR WHEEZING. Leslye Peer, MD Taking Active Family Member, Pharmacy Records           Med Note (LEE,  NICOLE   Wed Aug 08, 2023  3:31 AM) Rescue inhaler  amLODipine (NORVASC) 10 MG tablet [id number] No Take 1 tablet (10 mg total) by mouth daily.  Patient taking differently: Take 10 mg by mouth at bedtime.   Osvaldo Shipper, MD Taking Active Family Member, Pharmacy Records  atropine 1 % ophthalmic solution [id number] No Place 1 drop into both eyes 2 (two) times daily as needed. [provider] Taking Active Family Member, Pharmacy Records  Azelastine-Fluticasone 137-50 MCG/ACT SUSP [id number] No PLACE ONE SPRAY IN EACH NOSTRIL TWICE DAILY (IN THE MORNING AND AT BEDTIME)  Patient taking  differently: Place 1 spray into both nostrils in the morning and at bedtime.   Leslye Peer, MD Taking Active Family Member, Pharmacy Records  Budeson-Glycopyrrol-Formoterol [facility] AEROSPHERE) 160-9-4.8 MCG/ACT AERO [id number] No INHALE 2 PUFFS INTO THE LUNGS TWICE DAILY, IN THE MORNING & AT BEDTIME. Leslye Peer, MD Taking Active Family Member, Pharmacy Records  carvedilol (COREG) 12.5 MG tablet [id number] No Take 1 tablet (12.5 mg total) by mouth 2 (two) times daily. Nahser, Deloris Ping, MD Taking Active Family Member, Pharmacy Records  cetirizine (ZYRTEC) 10 MG tablet [id number] No Take 10 mg by mouth daily as needed for allergies or rhinitis. [provider] Taking Active Family Member, Pharmacy Records  citalopram (CELEXA) 20 MG tablet [id number] No Take 0.5 tablets (10 mg total) by mouth daily. Panosh, Neta Mends, MD Taking Active Family Member, Pharmacy Records  cloNIDine (CATAPRES) 0.1 MG tablet [id number] No TAKE ONE TABLET BY MOUTH TWICE DAILY AS NEEDED for hypertension  Patient not taking: Reported on 08/14/2023   [name], MD Not Taking Active Family Member, Pharmacy Records  diclofenac Sodium (VOLTAREN) 1 % GEL [id number] No Apply 4 g topically 2 (two) times daily as needed (painful areas). Ellsworth Lennox, PA-C Taking Active Family Member, Pharmacy Records  ferrous sulfate 325 (65 FE) MG EC tablet [id number] No Take 1 tablet (325 mg total) by mouth daily with breakfast. Panosh, Neta Mends, MD Taking Active Family Member, Pharmacy Records  fluticasone [facility]) 50 MCG/ACT nasal spray [id number] No Place 2 sprays into both nostrils as needed for allergies or rhinitis. [provider] Taking Active Family Member, Pharmacy Records  hydrALAZINE (APRESOLINE) 100 MG tablet [id number] No Take 1 tablet (100 mg total) by mouth 3 (three) times daily. Nahser, Deloris Ping, MD Taking Active Family Member, Pharmacy Records  isosorbide mononitrate (IMDUR) 30 MG 24 hr tablet [id number] No Take 1  tablet (30 mg total) by mouth daily. [place], Anderson Malta, FNP Taking Active Family Member, Pharmacy Records  LORazepam (ATIVAN) 0.5 MG tablet [id number]  Take 1 tablet (0.5 mg total) by mouth 2 (two) times daily as needed. for anxiety Panosh, Neta Mends, MD  Active   montelukast (SINGULAIR) 10 MG tablet [id number] No Take 1 tablet (10 mg total) by mouth daily.  Patient taking differently: Take 10 mg by mouth at bedtime.   Leslye Peer, MD Taking Active Family Member, Pharmacy Records  oxyCODONE (OXY IR/ROXICODONE) 5 MG immediate release tablet [id number] No Take 1 tablet (5 mg total) by mouth every 6 (six) hours as needed for severe pain (pain score 7-10).  Patient not taking: Reported on 08/14/2023   Panosh, Neta Mends, MD Not Taking Active Family Member, Pharmacy Records  pantoprazole (PROTONIX) 40 MG tablet [id number] No Take 1 tablet (40 mg total) by mouth daily.  Patient taking differently: Take 40 mg by mouth 2 (two) times daily.   Leslye Peer, MD Taking  Active Family Member, Pharmacy Records  RESTASIS 0.05 % ophthalmic emulsion [id number] No Place 1 drop into both eyes 2 (two) times daily. [provider] Taking Active Family Member, Pharmacy Records  sodium bicarbonate 650 MG tablet [id number] No Take 2 tablets (1,300 mg total) by mouth 2 (two) times daily. Maretta Bees, MD Taking Active   Spacer/Aero-Holding Chambers (AEROCHAMBER PLUS) inhaler [id number] No Use as instructed  Patient not taking: Reported on 08/08/2023   Domenick Gong, MD Not Taking Active Family Member, Pharmacy Records  torsemide 40 MG TABS [id number] No Take 40 mg by mouth daily.  Patient not taking: Reported on 08/14/2023   Maretta Bees, MD Not Taking Active            Med Note (ROSE, Aitanna Haubner U   Tue Aug 14, 2023  9:41 AM) Will pick up today  vitamin B-12 (CYANOCOBALAMIN) 1000 MCG tablet [id number] No Take 1,000 mcg by mouth daily. [provider] Taking Active Family Member, Pharmacy Records   Vitamin D, Cholecalciferol, 25 MCG (1000 UT) TABS [id number] No Take 1,000 Units by mouth daily. Eulis Foster, FNP Taking Active Family Member, Pharmacy Records            Recommendation:   PCP Follow-up  08/28/2023 Pulmonary follow up on 08/23/2023  Follow Up Plan:   Telephone follow-up in 1 week 08/29/2023  Lonia Chimera, RN, BSN, CEN Population Health- Transition of Care Team.  [facility] [phone]

## 2023-08-22 NOTE — Patient Instructions (Signed)
 Visit Information  Thank you for taking time to visit with me today. Please don't hesitate to contact me if I can be of assistance to you before our next scheduled telephone appointment.  Following are the goals we discussed today:   Goals       Care coordination Activities-No follow up required      Care Coordination Interventions: Evaluation of current treatment plan related to pneumonia and patient's adherence to plan as established by provider Advised patient to call physician for any signs of pneumonia coming back.  Reviewed scheduled/upcoming provider appointments including Pulmonologist and Nephrologist  Spoke with granddaughter [name]. She states patient is doing good. Slowly getting better. Appetite improving.  Patient remains independent and still drives.   Discussed [facility] support.  She declined further follow up at this time.        TOC- Patient and or grandaughter will report no readmission in the next 30 days. (pt-stated)      Current Barriers:  Provider appointments no PCP follow up scheduled yet.  08/22/2023   Granddaughter reports PCP follow up planned for 08/28/2023,  Pulmonary follow up for 08/23/2023 Home Health services pending start of case with [facility].  08/22/2023   Granddaughter reports that she will be out of school next week and will have palliative care come next week. She has phone number and will call them herself.  CHF and renal failure- refused dialysis.  08/22/2023   Grand daughter reports that patient has increase in appetite.    RNCM Clinical Goal(s):  Patient will work with the Care Management team over the next 30 days to address Transition of Care Barriers: Provider appointments Home Health services take all medications exactly as prescribed and will call provider for medication related questions as evidenced by grand daughter report attend all scheduled medical appointments: PCP as evidenced by review of EMR and patient/ granddaughter  report.  work with Geologist, engineering to develop plan of care  as evidenced by grand daughter report  through collaboration with Medical illustrator, provider, and care team.   Interventions: Evaluation of current treatment plan related to  self management and patient's adherence to plan as established by provider  Transitions of Care:  Goal on track:  Yes. Doctor Visits  - discussed the importance of doctor visits Reviewed importance of follow up with MD's Reviewed with grand daughter to ask questions that she has to MD.  Reviewed D/C diet of low salt, heart healthy.   Heart Failure Interventions:  (Status:  New goal.) Short Term Goal Provided education on low sodium diet. Encouraged grand daughter to discuss diet with MD Reviewed swelling  Encouraged granddaughter to continue to manage patients medications and self monitor BP readings.  Reviewed importance of calling palliative care to come to do assessment.   Chronic Kidney Disease Interventions:  (Status:  Goal on track:  Yes.) Short Term Goal Reviewed pending palliative consult Last practice recorded BP readings:  BP Readings from Last 3 Encounters:  08/13/23 (!) 181/75  08/07/23 (!) 170/78  07/17/23 (!) 140/62   Most recent eGFR/CrCl:  Lab Results  Component Value Date   EGFR 10.0 04/19/2023    No components found for: "CRCL"  Patient Goals/Self-Care Activities: Participate in Transition of Care Program/Attend [facility] scheduled calls Notify RN Care Manager of TOC call rescheduling needs Take all medications as prescribed Attend all scheduled provider appointments Call provider office for new concerns or questions  know when to call the doctor:increased shortness of breath  Follow Up  Plan:  Telephone follow up appointment with care management team member scheduled for:  08/29/2023 The patient has been provided with contact information for the care management team and has been advised to call with any health related questions or  concerns.           Our next appointment is by telephone on 08/29/2023   Please call the care guide team at [phone] if you need to cancel or reschedule your appointment.   If you are experiencing a Mental Health or Behavioral Health Crisis or need someone to talk to, please call the Suicide and Crisis Lifeline: 988 call the [place] National Suicide Prevention Lifeline: 716-535-7936 or TTY: 361-738-8484 TTY 518-458-6751) to talk to a trained counselor call 1-800-273-TALK (toll free, 24 hour hotline) call 911   Patient verbalizes understanding of instructions and care plan provided today and agrees to view in MyChart. Active MyChart status and patient understanding of how to access instructions and care plan via MyChart confirmed with patient.     Lonia Chimera, RN, BSN, CEN [employer]- Transition of Care Team.  [facility] [phone]

## 2023-08-23 ENCOUNTER — Other Ambulatory Visit: Payer: Self-pay | Admitting: Emergency Medicine

## 2023-08-23 ENCOUNTER — Ambulatory Visit: Admitting: Emergency Medicine

## 2023-08-23 ENCOUNTER — Encounter: Payer: Self-pay | Admitting: Emergency Medicine

## 2023-08-23 VITALS — BP 137/79 | HR 59 | Ht 62.0 in | Wt 143.6 lb

## 2023-08-23 DIAGNOSIS — J310 Chronic rhinitis: Secondary | ICD-10-CM | POA: Diagnosis not present

## 2023-08-23 DIAGNOSIS — J45909 Unspecified asthma, uncomplicated: Secondary | ICD-10-CM | POA: Diagnosis not present

## 2023-08-23 NOTE — Assessment & Plan Note (Signed)
 Add back Zyrtec and fluticasone nasal spray during the spring months

## 2023-08-23 NOTE — Assessment & Plan Note (Signed)
 She has been debilitated, had an acute illness with hospitalization that was principally due to volume overload in the setting of cardiorenal syndrome.  She is improved but still quite debilitated.  Suspect she will be headed for palliative care if she does want to do dialysis.  I support her in this if this is her decision.  With regard to her asthma it seems to be fairly well-controlled unclear whether she was truly exacerbated.  She is not wheezing now and has minimal symptoms.  Plan to continue her Breztri, albuterol as needed.  Will also work on treating her allergic rhinitis during the spring months to help keep her asthma under better control.

## 2023-08-23 NOTE — Patient Instructions (Signed)
 Please continue your Breztri 2 puffs twice a day.  Rinse and gargle after using. Keep albuterol available to use 2 puffs if needed for shortness of breath, chest tightness, wheezing. Restart your Zyrtec once daily during the allergy season Restart your fluticasone nasal spray (Flonase) 2 sprays each nostril once daily during the allergy season Follow with nephrology and cardiology as planned Follow Dr. Delton Coombes in 6 months, sooner if you have any problems.

## 2023-08-23 NOTE — Progress Notes (Signed)
 Subjective:    Patient ID: [name], female    DOB: [birth year], 89 y.o.   MRN: [id number]  HPI  [name] 04/19/2023 --follow-up visit for 89 year old woman for moderate persistent asthma/COPD, chronic cough and upper airway instability, secondary PAH.  She also has a history of diabetes and lower extremity DVT, hypertension, question SLE, GERD and allergic rhinitis. We have been managing her on Breztri, Protonix, Singulair, Dymista or flonase prn, zyrtec prn, although she is now off the PPI and the Singulair (ran out).  She had flaring symptoms including cough and wheezing in March 2024 and required prescription for prednisone. She was hospitalized w parainfluenza and CAP in June.  Today she reports that she is improved. She is not using albuterol every day. Does believe that she is benefiting from the [place]. She has intermittent cough and wheeze.    Hospital follow-up visit 08/23/2023 --  89 year old woman with moderate persistent asthma/COPD, upper airway irritability and chronic cough, secondary PAH.  PMH also significant for diabetes, lower extremity DVT, hypertension with HFpEF, possible SLE, GERD, chronic rhinitis.  She was hospitalized in late March with dyspnea and found to have acute on chronic stage V renal failure with exacerbation of HFpEF and volume overload.  She indicated that she did not want to pursue hemodialysis.  She was treated empirically for pneumonia although no clear evidence for infection noted.  Plan was to continue medical management.  She was seen by palliative care while in the hospital. She is on breztri. Has not needed any albuterol. Not on any nasal sprays or allergy meds right now. She does have some drainage and congestion. She can sometimes hear wheeze at night, better when she repositions.   Review of Systems As per Hpi     Objective:   Physical Exam Vitals:   08/23/23 1340 08/23/23 1341  BP: (!) 141/84 137/79  Pulse: (!) 59   SpO2: 98%   Weight:  143 lb 9.6 oz (65.1 kg)   Height: 5\' 2"  (1.575 m)      Gen: Pleasant, overweight, in no distress,  normal affect  ENT: No lesions,  mouth clear,  oropharynx clear, no postnasal drip  Neck: No JVD, strong voice.  Very subtle end expiratory stridor  Lungs: No use of accessory muscles, no crackles or wheezing on normal respiration  Cardiovascular: RRR, heart sounds normal, no murmur or gallops, trace ankle peripheral edema  Musculoskeletal: No deformities, no cyanosis or clubbing  Neuro: alert, awake, non focal  Skin: Warm, no lesions or rash       Assessment & Plan:  Asthma, chronic She has been debilitated, had an acute illness with hospitalization that was principally due to volume overload in the setting of cardiorenal syndrome.  She is improved but still quite debilitated.  Suspect she will be headed for palliative care if she does want to do dialysis.  I support her in this if this is her decision.  With regard to her asthma it seems to be fairly well-controlled unclear whether she was truly exacerbated.  She is not wheezing now and has minimal symptoms.  Plan to continue her Breztri, albuterol as needed.  Will also work on treating her allergic rhinitis during the spring months to help keep her asthma under better control.  Chronic rhinitis Add back Zyrtec and fluticasone nasal spray during the spring months    Levy Pupa, MD, PhD 08/23/2023, 1:53 PM [facility] [phone] or if no answer [phone]

## 2023-08-28 ENCOUNTER — Ambulatory Visit: Admitting: Internal Medicine

## 2023-08-29 ENCOUNTER — Other Ambulatory Visit: Payer: Self-pay

## 2023-08-29 NOTE — Patient Outreach (Signed)
 Care Management  Transitions of Care Program Transitions of Care Post-discharge week 3  08/29/2023 Name: [name] MRN: [id number] DOB: 1933-08-17  Subjective: [name] is a 89 y.o. year old female who is a primary care patient of Panosh, Wanda K, MD. The Care Management team was unable to reach the patient by phone to assess and address transitions of care needs.   Plan: Additional outreach attempts will be made to reach the patient enrolled in the [facility] Program (Post Inpatient/ED Visit).  Orpha Blade, RN, BSN, CEN [employer]- Transition of Care Team.  [facility] [phone]

## 2023-08-30 ENCOUNTER — Telehealth: Payer: Self-pay

## 2023-08-30 NOTE — Patient Outreach (Signed)
 Care Management  Transitions of Care Program Transitions of Care Post-discharge week 3  08/30/2023 Name: [name] MRN: [id number] DOB: 12-18-1933  Subjective: [name] is a 89 y.o. year old female who is a primary care patient of Panosh, Wanda K, MD. The Care Management team was unable to reach the patient by phone to assess and address transitions of care needs.   Plan: Additional outreach attempts will be made to reach the patient enrolled in the [facility] Program (Post Inpatient/ED Visit).  Orpha Blade, RN, BSN, CEN [employer]- Transition of Care Team.  [facility] [phone]

## 2023-09-03 ENCOUNTER — Telehealth: Payer: Self-pay

## 2023-09-03 NOTE — Patient Outreach (Signed)
 Care Management  Transitions of Care Program Transitions of Care Post-discharge week 3  09/03/2023 Name: [name] MRN: [id number] DOB: 07-Feb-1934  Subjective: [name] is a 89 y.o. year old female who is a primary care patient of Panosh, Wanda K, MD. The Care Management team was unable to reach the patient by phone to assess and address transitions of care needs.   Plan: No further outreach attempts will be made at this time.  We have been unable to reach the patient.  Orpha Blade, RN, BSN, CEN [employer]- Transition of Care Team.  [facility] [phone]

## 2023-09-06 DIAGNOSIS — M7989 Other specified soft tissue disorders: Secondary | ICD-10-CM | POA: Diagnosis not present

## 2023-09-06 DIAGNOSIS — J45909 Unspecified asthma, uncomplicated: Secondary | ICD-10-CM | POA: Diagnosis not present

## 2023-09-06 DIAGNOSIS — N185 Chronic kidney disease, stage 5: Secondary | ICD-10-CM | POA: Diagnosis not present

## 2023-09-06 DIAGNOSIS — I120 Hypertensive chronic kidney disease with stage 5 chronic kidney disease or end stage renal disease: Secondary | ICD-10-CM | POA: Diagnosis not present

## 2023-09-06 DIAGNOSIS — N189 Chronic kidney disease, unspecified: Secondary | ICD-10-CM | POA: Diagnosis not present

## 2023-09-06 DIAGNOSIS — D631 Anemia in chronic kidney disease: Secondary | ICD-10-CM | POA: Diagnosis not present

## 2023-09-06 DIAGNOSIS — N2581 Secondary hyperparathyroidism of renal origin: Secondary | ICD-10-CM | POA: Diagnosis not present

## 2023-09-06 LAB — CBC: RBC: 2.87 — AB (ref 3.87–5.11)

## 2023-09-06 LAB — BASIC METABOLIC PANEL WITH GFR
BUN: 78 — AB (ref 4–21)
CO2: 23 — AB (ref 13–22)
Chloride: 107 (ref 99–108)
Creatinine: 5.5 — AB (ref 0.5–1.1)
Glucose: 128
Potassium: 4.7 meq/L (ref 3.5–5.1)
Sodium: 144 (ref 137–147)

## 2023-09-06 LAB — CBC AND DIFFERENTIAL
Hemoglobin: 8.4 — AB (ref 12.0–16.0)
WBC: 3.2

## 2023-09-06 LAB — COMPREHENSIVE METABOLIC PANEL WITH GFR: Albumin: 3.3 — AB (ref 3.5–5.0)

## 2023-09-07 LAB — LAB REPORT - SCANNED: EGFR: 7

## 2023-09-11 ENCOUNTER — Ambulatory Visit: Admitting: Internal Medicine

## 2023-09-11 ENCOUNTER — Ambulatory Visit (INDEPENDENT_AMBULATORY_CARE_PROVIDER_SITE_OTHER): Admitting: Internal Medicine

## 2023-09-11 ENCOUNTER — Inpatient Hospital Stay

## 2023-09-11 ENCOUNTER — Ambulatory Visit (HOSPITAL_COMMUNITY)
Admission: RE | Admit: 2023-09-11 | Discharge: 2023-09-11 | Disposition: A | Discharge status: Home / Self Care | Source: Ambulatory Visit | Attending: Internal Medicine | Admitting: Internal Medicine

## 2023-09-11 ENCOUNTER — Encounter: Payer: Self-pay | Admitting: Internal Medicine

## 2023-09-11 ENCOUNTER — Inpatient Hospital Stay: Attending: Nurse Practitioner

## 2023-09-11 ENCOUNTER — Telehealth: Payer: Self-pay

## 2023-09-11 VITALS — BP 174/70 | HR 62 | Temp 97.9°F | Ht 62.0 in | Wt 153.0 lb

## 2023-09-11 DIAGNOSIS — M79601 Pain in right arm: Secondary | ICD-10-CM | POA: Diagnosis not present

## 2023-09-11 DIAGNOSIS — M7989 Other specified soft tissue disorders: Secondary | ICD-10-CM

## 2023-09-11 DIAGNOSIS — I130 Hypertensive heart and chronic kidney disease with heart failure and stage 1 through stage 4 chronic kidney disease, or unspecified chronic kidney disease: Secondary | ICD-10-CM | POA: Diagnosis not present

## 2023-09-11 DIAGNOSIS — M25511 Pain in right shoulder: Secondary | ICD-10-CM

## 2023-09-11 DIAGNOSIS — S63001A Unspecified subluxation of right wrist and hand, initial encounter: Secondary | ICD-10-CM | POA: Diagnosis not present

## 2023-09-11 DIAGNOSIS — N184 Chronic kidney disease, stage 4 (severe): Secondary | ICD-10-CM

## 2023-09-11 DIAGNOSIS — M19041 Primary osteoarthritis, right hand: Secondary | ICD-10-CM | POA: Insufficient documentation

## 2023-09-11 DIAGNOSIS — M19011 Primary osteoarthritis, right shoulder: Secondary | ICD-10-CM | POA: Insufficient documentation

## 2023-09-11 DIAGNOSIS — I120 Hypertensive chronic kidney disease with stage 5 chronic kidney disease or end stage renal disease: Secondary | ICD-10-CM | POA: Insufficient documentation

## 2023-09-11 DIAGNOSIS — D631 Anemia in chronic kidney disease: Secondary | ICD-10-CM | POA: Diagnosis not present

## 2023-09-11 DIAGNOSIS — N185 Chronic kidney disease, stage 5: Secondary | ICD-10-CM | POA: Diagnosis not present

## 2023-09-11 DIAGNOSIS — M778 Other enthesopathies, not elsewhere classified: Secondary | ICD-10-CM | POA: Diagnosis not present

## 2023-09-11 DIAGNOSIS — D638 Anemia in other chronic diseases classified elsewhere: Secondary | ICD-10-CM

## 2023-09-11 DIAGNOSIS — M1811 Unilateral primary osteoarthritis of first carpometacarpal joint, right hand: Secondary | ICD-10-CM | POA: Diagnosis not present

## 2023-09-11 DIAGNOSIS — Z79899 Other long term (current) drug therapy: Secondary | ICD-10-CM | POA: Diagnosis not present

## 2023-09-11 DIAGNOSIS — N183 Chronic kidney disease, stage 3 unspecified: Secondary | ICD-10-CM

## 2023-09-11 LAB — CBC WITH DIFFERENTIAL (CANCER CENTER ONLY)
Abs Immature Granulocytes: 0.04 10*3/uL (ref 0.00–0.07)
Basophils Absolute: 0 10*3/uL (ref 0.0–0.1)
Basophils Relative: 0 %
Eosinophils Absolute: 0.1 10*3/uL (ref 0.0–0.5)
Eosinophils Relative: 1 %
HCT: 25.2 % — ABNORMAL LOW (ref 36.0–46.0)
Hemoglobin: 7.9 g/dL — ABNORMAL LOW (ref 12.0–15.0)
Immature Granulocytes: 1 %
Lymphocytes Relative: 31 %
Lymphs Abs: 1.1 10*3/uL (ref 0.7–4.0)
MCH: 28.9 pg (ref 26.0–34.0)
MCHC: 31.3 g/dL (ref 30.0–36.0)
MCV: 92.3 fL (ref 80.0–100.0)
Monocytes Absolute: 0.3 10*3/uL (ref 0.1–1.0)
Monocytes Relative: 7 %
Neutro Abs: 2.2 10*3/uL (ref 1.7–7.7)
Neutrophils Relative %: 60 %
Platelet Count: 202 10*3/uL (ref 150–400)
RBC: 2.73 MIL/uL — ABNORMAL LOW (ref 3.87–5.11)
RDW: 14 % (ref 11.5–15.5)
WBC Count: 3.7 10*3/uL — ABNORMAL LOW (ref 4.0–10.5)
nRBC: 0 % (ref 0.0–0.2)

## 2023-09-11 LAB — SAMPLE TO BLOOD BANK

## 2023-09-11 LAB — FERRITIN: Ferritin: 446 ng/mL — ABNORMAL HIGH (ref 11–307)

## 2023-09-11 MED ORDER — DARBEPOETIN ALFA 100 MCG/0.5ML IJ SOSY
100.0000 ug | PREFILLED_SYRINGE | Freq: Once | INTRAMUSCULAR | Status: AC
  Administered 2023-09-11: 100 ug via SUBCUTANEOUS
  Filled 2023-09-11: qty 0.5

## 2023-09-11 NOTE — Telephone Encounter (Signed)
 FYI  Contacted Dr. Myrtle Atta office and spoke to [place]. She states the labs havent review by Dr. Christianne Cowper yet. Once they are review, the result will be send.

## 2023-09-11 NOTE — Progress Notes (Signed)
 Chief Complaint  Patient presents with   Medical Management of Chronic Issues    Pt is with daughter, Benancio Bracket. Daughter concerns about swelling on pt's R hand and R arm. Went through [employer] with granddaughter on phone. Request a refill on Amlodipine . Advise to request med with Dr. Lyndon Santiago as provider didn't prescribe it.    Edema    Daughter reports sx started 2-3 wks ago. Comes and goes. Worsen about 2 wks ago.     HPI: Nitara Schabel 89 y.o. come in w daughter  for new problem after  hospitalization for  resp distress felt from fluid overload  and asthma flare in setting of renal compromise. Had pulm check recent and no other   intervention  to continue on  inhalers  consider  palliative care since not entertaining   dialysis. Hasn't taken bp meds this am   Arms swells   comes andgoes  arm swelling.r  cp of r shoulder pain  but not sleeping on arm  no IV reported  and no fall injury reported .   No neck swelling and resp stable  Feet swelling not a lot different   Reports  had blood work done at  renal office rec[facility]  Dr Christianne Cowper   ROS: See pertinent positives and negatives per HPI.  Past Medical History:  Diagnosis Date   Acute superficial venous thrombosis of left lower extremity 03/27/2014   concern about hx and potential extensive on exam tenderness calf and low dose lovenox  40 qd 2-4 weekselevetion and close  fu advised .    Allergy    Anemia    Anxiety    Arthritis    "shoulders" (09/09/2012)   Asthma 09/08/2012   Carotid bruit    us  2018  low risk 1 - 39%   Cataract    bil cateracts removed   Chronic kidney disease    Clotting disorder (HCC)    blood clots in legs   Diabetes mellitus without complication (HCC)    "Borderline" per pt; being monitored.  no meds per pt   Diverticulosis    Dyspnea 04/27/2014   GERD (gastroesophageal reflux disease)    Headache(784.0)    "related to my high blood pressure" (09/09/2012)   Hiatal hernia    History of DVT (deep vein  thrombosis)    "RLE" (09/09/2012) coumadine cant take asa so on plavix     HTN (hypertension) 09/08/2012   Hyperlipidemia    Lupus    "cured years ago" (09/09/2012)   Other dysphagia 02/11/2013   Syncope and collapse 01/21/2018   Varicose veins    Varicose veins of leg with complications 12/05/2010    Family History  Problem Relation Age of Onset   Hypertension Mother    Lung cancer Mother    Hypertension Father    Lung cancer Father    Breast cancer Sister    Colon cancer Sister        ? 32' s dx - died in 52's   Breast cancer Sister    Hypertension Brother    Rectal cancer Brother    Stomach cancer Brother    Breast cancer Brother    Heart attack Daughter    Esophageal cancer Daughter    Hypertension Daughter    Diabetes Daughter    Breast cancer Paternal Aunt    Lung cancer Other        both parents    Pancreatic cancer Neg Hx     Social History  Socioeconomic History   Marital status: Single    Spouse name: Not on file   Number of children: 6   Years of education: 9   Highest education level: Not on file  Occupational History   Occupation: retired  Tobacco Use   Smoking status: Never   Smokeless tobacco: Never  Vaping Use   Vaping status: Never Used  Substance and Sexual Activity   Alcohol use: No   Drug use: No   Sexual activity: Not Currently    Birth control/protection: Surgical  Other Topics Concern   Not on file  Social History Narrative   2 people living in the home.  Grand daughter.Up and down through the nightHad 7 children  2 deceased . 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  Neg tad    Left handed   Drinks caffeine prn    Granddaughter lives with her   One floor home   Social Drivers of Health   Financial Resource Strain: Low Risk  (04/14/2022)   Overall Financial Resource Strain (CARDIA)    Difficulty of Paying  Living Expenses: Not very hard  Food Insecurity: No Food Insecurity (08/14/2023)   Hunger Vital Sign    Worried About Running Out of Food in the Last Year: Never true    Ran Out of Food in the Last Year: Never true  Recent Concern: Food Insecurity - Food Insecurity Present (08/08/2023)   Hunger Vital Sign    Worried About Running Out of Food in the Last Year: Never true    Ran Out of Food in the Last Year: Sometimes true  Transportation Needs: No Transportation Needs (08/14/2023)   PRAPARE - Administrator, Civil Service (Medical): No    Lack of Transportation (Non-Medical): No  Physical Activity: Not on file  Stress: Not on file  Social Connections: Moderately Isolated (08/08/2023)   Social Connection and Isolation Panel [NHANES]    Frequency of Communication with Friends and Family: Three times a week    Frequency of Social Gatherings with Friends and Family: More than three times a week    Attends Religious Services: More than 4 times per year    Active Member of [employer] or Organizations: No    Attends Banker Meetings: Never    Marital Status: Never married    Outpatient Medications Prior to Visit  Medication Sig Dispense Refill   acetaminophen  (TYLENOL ) 500 MG tablet Take 500 mg by mouth 2 (two) times daily as needed for mild pain (pain score 1-3) or headache.     albuterol  (PROVENTIL ) (2.5 MG/3ML) 0.083% nebulizer solution Take 3 mLs (2.5 mg total) by nebulization every 6 (six) hours as needed for shortness of breath or wheezing. 360 mL 6   albuterol  (VENTOLIN  HFA) 108 (90 Base) MCG/ACT inhaler USE 2 PUFFS EVERY 6 HOURS AS NEEDED FOR WHEEZING. 8.5 g 3   amLODipine  (NORVASC ) 10 MG tablet Take 1 tablet (10 mg total) by mouth daily. (Patient taking differently: Take 10 mg by mouth at bedtime.) 30 tablet 1   Budeson-Glycopyrrol-Formoterol  (BREZTRI  AEROSPHERE) 160-9-4.8 MCG/ACT AERO INHALE 2 PUFFS INTO THE LUNGS TWICE DAILY, IN THE MORNING & AT BEDTIME. 10.7 g 11    carvedilol  (COREG ) 12.5 MG tablet Take 1 tablet (12.5 mg total) by mouth 2 (two) times daily. 180 tablet 3   citalopram  (CELEXA ) 20 MG tablet Take  0.5 tablets (10 mg total) by mouth daily. 45 tablet 1   cloNIDine  (CATAPRES ) 0.1 MG tablet TAKE ONE TABLET BY MOUTH TWICE DAILY AS NEEDED for hypertension 180 tablet 1   ferrous sulfate  325 (65 FE) MG EC tablet Take 1 tablet (325 mg total) by mouth daily with breakfast. 30 tablet 3   fluticasone  (FLONASE ) 50 MCG/ACT nasal spray Place 2 sprays into both nostrils as needed for allergies or rhinitis.     hydrALAZINE  (APRESOLINE ) 100 MG tablet Take 1 tablet (100 mg total) by mouth 3 (three) times daily. 270 tablet 2   isosorbide  mononitrate (IMDUR ) 30 MG 24 hr tablet Take 1 tablet (30 mg total) by mouth daily. 90 tablet 3   LORazepam  (ATIVAN ) 0.5 MG tablet Take 1 tablet (0.5 mg total) by mouth 2 (two) times daily as needed. for anxiety 30 tablet 0   montelukast  (SINGULAIR ) 10 MG tablet Take 1 tablet (10 mg total) by mouth daily. 90 tablet 3   sodium bicarbonate  650 MG tablet Take 2 tablets (1,300 mg total) by mouth 2 (two) times daily. 120 tablet 0   torsemide  40 MG TABS Take 40 mg by mouth daily. (Patient taking differently: Take 40 mg by mouth daily. 2 pills two times a day.) 30 tablet 1   vitamin B-12 (CYANOCOBALAMIN ) 1000 MCG tablet Take 1,000 mcg by mouth daily.     Vitamin D , Cholecalciferol , 25 MCG (1000 UT) TABS Take 1,000 Units by mouth daily. 60 tablet 2   atropine 1 % ophthalmic solution Place 1 drop into both eyes 2 (two) times daily as needed. (Patient not taking: Reported on 09/11/2023)     Azelastine -Fluticasone  137-50 MCG/ACT SUSP PLACE ONE SPRAY IN EACH NOSTRIL TWICE DAILY (IN THE MORNING AND AT BEDTIME) (Patient not taking: No sig reported) 23 g 6   cetirizine (ZYRTEC) 10 MG tablet Take 10 mg by mouth daily as needed for allergies or rhinitis. (Patient not taking: Reported on 08/23/2023)     diclofenac  Sodium (VOLTAREN ) 1 % GEL Apply 4 g  topically 2 (two) times daily as needed (painful areas). (Patient not taking: Reported on 09/11/2023) 50 g 1   oxyCODONE  (OXY IR/ROXICODONE ) 5 MG immediate release tablet Take 1 tablet (5 mg total) by mouth every 6 (six) hours as needed for severe pain (pain score 7-10). (Patient not taking: Reported on 08/14/2023) 20 tablet 0   pantoprazole  (PROTONIX ) 40 MG tablet Take 1 tablet (40 mg total) by mouth daily. (Patient not taking: Reported on 08/23/2023) 90 tablet 3   RESTASIS 0.05 % ophthalmic emulsion Place 1 drop into both eyes 2 (two) times daily. (Patient not taking: Reported on 09/11/2023)     Spacer/Aero-Holding Chambers (AEROCHAMBER PLUS) inhaler Use as instructed (Patient not taking: Reported on 09/11/2023) 1 each 2   No facility-administered medications prior to visit.     EXAM:  BP (!) 174/70 (BP Location: Left Arm, Patient Position: Sitting, Cuff Size: Normal)   Pulse 62   Temp 97.9 F (36.6 C) (Oral)   Ht 5\' 2"  (1.575 m)   Wt 153 lb (69.4 kg)   SpO2 97%   BMI 27.98 kg/m   Body mass index is 27.98 kg/m.  GENERAL: vitals reviewed and listed above, alert, conversational , appears well hydrated and in no acute distress HEENT: atraumatic, conjunctiva  clear, no obvious abnormalities on inspection of external nose and ears  NECK: no obvious masses on inspection palpation  no edema or mass felt  LUNGS: clear to auscultation bilaterally, no wheezes,  rales or rhonchi, CV: HRRR, no clubbing cyanosis 2+  peripheral edema nl cap refill  MS: moves all extremities hard to lift r arm   shoulder  not swollen or hot  forearm distal with swelling edema  mild erythema no warmth   can grip fingers  no bruising or obv bony tenderness and not at elbow.  PSYCH: pleasant and cooperative,  Lab Results  Component Value Date   WBC 7.1 08/10/2023   HGB 9.2 (L) 08/10/2023   HCT 28.3 (L) 08/10/2023   PLT 189 08/10/2023   GLUCOSE 101 (H) 08/13/2023   CHOL 190 07/21/2022   TRIG 97 07/21/2022   HDL 92  07/21/2022   LDLCALC 81 07/21/2022   ALT 12 03/13/2023   AST 22 03/13/2023   NA 136 08/13/2023   K 3.8 08/13/2023   CL 106 08/13/2023   CREATININE 5.24 (H) 08/13/2023   BUN 94 (H) 08/13/2023   CO2 18 (L) 08/13/2023   TSH 2.18 06/27/2023   INR 1.0 10/31/2022   HGBA1C 5.3 06/29/2022   MICROALBUR 119 03/02/2021   BP Readings from Last 3 Encounters:  09/11/23 (!) 174/70  08/23/23 137/79  08/13/23 (!) 181/75    ASSESSMENT AND PLAN:  Discussed the following assessment and plan:  Arm swelling - Plan: DG Shoulder Right, DG Humerus Right, DG Forearm Right, DG Wrist Complete Right, DG Hand Complete Right, VAS US  UPPER EXTREMITY VENOUS DUPLEX  Right shoulder pain, unspecified chronicity - Plan: DG Shoulder Right, DG Humerus Right, DG Forearm Right, DG Wrist Complete Right, DG Hand Complete Right  Hypertensive heart disease with congestive heart failure and chronic kidney disease, unspecified CKD stage, unspecified heart failure type (HCC)  Chronic kidney disease (CKD), stage IV (severe) (HCC) Look for updated  labs from renal so no labs today until more info X ray whole r arm and shoulder  uncertain cause  Swelling could be dependent since pain is in shoulder but no swelling at all in upper arm and no masses  Vascular study venous   Bp reported up today  as hasn't  taken meds yet....  -Patient advised to return or notify health care team  if  new concerns arise. Record review  plan counsel 45 minutes  Patient Instructions  Not sure why swelling  but get x ray  arm and shoulder   And I will order also a  vascular   doppler also to make sure not blood clots .    Arlayne Liggins K. Shrita Thien M.D.

## 2023-09-11 NOTE — Patient Instructions (Signed)
 Not sure why swelling  but get x ray  arm and shoulder   And I will order also a  vascular   doppler also to make sure not blood clots .

## 2023-09-14 ENCOUNTER — Other Ambulatory Visit: Payer: Self-pay | Admitting: Internal Medicine

## 2023-09-14 ENCOUNTER — Ambulatory Visit (HOSPITAL_COMMUNITY)

## 2023-09-19 ENCOUNTER — Ambulatory Visit (HOSPITAL_COMMUNITY)
Admission: RE | Admit: 2023-09-19 | Discharge: 2023-09-19 | Disposition: A | Discharge status: Home / Self Care | Source: Ambulatory Visit | Attending: Internal Medicine | Admitting: Internal Medicine

## 2023-09-19 DIAGNOSIS — M7989 Other specified soft tissue disorders: Secondary | ICD-10-CM | POA: Diagnosis not present

## 2023-09-19 DIAGNOSIS — Z886 Allergy status to analgesic agent status: Secondary | ICD-10-CM | POA: Diagnosis not present

## 2023-09-19 DIAGNOSIS — I110 Hypertensive heart disease with heart failure: Secondary | ICD-10-CM | POA: Diagnosis not present

## 2023-09-19 DIAGNOSIS — R531 Weakness: Secondary | ICD-10-CM | POA: Diagnosis not present

## 2023-09-19 DIAGNOSIS — I160 Hypertensive urgency: Secondary | ICD-10-CM | POA: Diagnosis not present

## 2023-09-19 DIAGNOSIS — I2489 Other forms of acute ischemic heart disease: Secondary | ICD-10-CM | POA: Diagnosis not present

## 2023-09-19 DIAGNOSIS — J4541 Moderate persistent asthma with (acute) exacerbation: Secondary | ICD-10-CM | POA: Diagnosis not present

## 2023-09-19 DIAGNOSIS — Z91013 Allergy to seafood: Secondary | ICD-10-CM | POA: Diagnosis not present

## 2023-09-19 DIAGNOSIS — Z0000 Encounter for general adult medical examination without abnormal findings: Secondary | ICD-10-CM | POA: Diagnosis not present

## 2023-09-19 DIAGNOSIS — I214 Non-ST elevation (NSTEMI) myocardial infarction: Secondary | ICD-10-CM | POA: Diagnosis not present

## 2023-09-19 DIAGNOSIS — I5043 Acute on chronic combined systolic (congestive) and diastolic (congestive) heart failure: Secondary | ICD-10-CM | POA: Diagnosis not present

## 2023-09-19 DIAGNOSIS — Z9842 Cataract extraction status, left eye: Secondary | ICD-10-CM | POA: Diagnosis not present

## 2023-09-19 DIAGNOSIS — Z9841 Cataract extraction status, right eye: Secondary | ICD-10-CM | POA: Diagnosis not present

## 2023-09-19 DIAGNOSIS — J9601 Acute respiratory failure with hypoxia: Secondary | ICD-10-CM | POA: Diagnosis not present

## 2023-09-19 DIAGNOSIS — D631 Anemia in chronic kidney disease: Secondary | ICD-10-CM | POA: Diagnosis not present

## 2023-09-19 DIAGNOSIS — I132 Hypertensive heart and chronic kidney disease with heart failure and with stage 5 chronic kidney disease, or end stage renal disease: Secondary | ICD-10-CM | POA: Diagnosis not present

## 2023-09-19 DIAGNOSIS — I509 Heart failure, unspecified: Secondary | ICD-10-CM | POA: Diagnosis not present

## 2023-09-19 DIAGNOSIS — E872 Acidosis, unspecified: Secondary | ICD-10-CM | POA: Diagnosis not present

## 2023-09-19 DIAGNOSIS — N185 Chronic kidney disease, stage 5: Secondary | ICD-10-CM | POA: Diagnosis not present

## 2023-09-19 DIAGNOSIS — E1122 Type 2 diabetes mellitus with diabetic chronic kidney disease: Secondary | ICD-10-CM | POA: Diagnosis not present

## 2023-09-19 DIAGNOSIS — Z79899 Other long term (current) drug therapy: Secondary | ICD-10-CM | POA: Diagnosis not present

## 2023-09-19 DIAGNOSIS — I272 Pulmonary hypertension, unspecified: Secondary | ICD-10-CM | POA: Diagnosis not present

## 2023-09-19 DIAGNOSIS — E785 Hyperlipidemia, unspecified: Secondary | ICD-10-CM | POA: Diagnosis not present

## 2023-09-19 DIAGNOSIS — N179 Acute kidney failure, unspecified: Secondary | ICD-10-CM | POA: Diagnosis not present

## 2023-09-19 DIAGNOSIS — Z880 Allergy status to penicillin: Secondary | ICD-10-CM | POA: Diagnosis not present

## 2023-09-19 DIAGNOSIS — I5021 Acute systolic (congestive) heart failure: Secondary | ICD-10-CM | POA: Diagnosis not present

## 2023-09-19 DIAGNOSIS — Z8249 Family history of ischemic heart disease and other diseases of the circulatory system: Secondary | ICD-10-CM | POA: Diagnosis not present

## 2023-09-19 DIAGNOSIS — Z833 Family history of diabetes mellitus: Secondary | ICD-10-CM | POA: Diagnosis not present

## 2023-09-19 DIAGNOSIS — K219 Gastro-esophageal reflux disease without esophagitis: Secondary | ICD-10-CM | POA: Diagnosis not present

## 2023-09-19 DIAGNOSIS — Z86718 Personal history of other venous thrombosis and embolism: Secondary | ICD-10-CM | POA: Diagnosis not present

## 2023-09-21 ENCOUNTER — Encounter: Payer: Self-pay | Admitting: Internal Medicine

## 2023-09-21 ENCOUNTER — Inpatient Hospital Stay (HOSPITAL_BASED_OUTPATIENT_CLINIC_OR_DEPARTMENT_OTHER)
Admission: EM | Admit: 2023-09-21 | Discharge: 2023-09-26 | DRG: 291 | Disposition: A | Discharge status: Home Health | Attending: Internal Medicine | Admitting: Internal Medicine

## 2023-09-21 ENCOUNTER — Other Ambulatory Visit: Payer: Self-pay

## 2023-09-21 ENCOUNTER — Ambulatory Visit: Payer: Medicare Other

## 2023-09-21 ENCOUNTER — Other Ambulatory Visit: Payer: Self-pay | Admitting: Nephrology

## 2023-09-21 VITALS — BP 122/62 | HR 90 | Temp 98.8°F | Ht 62.0 in | Wt 142.6 lb

## 2023-09-21 DIAGNOSIS — Z880 Allergy status to penicillin: Secondary | ICD-10-CM

## 2023-09-21 DIAGNOSIS — I2489 Other forms of acute ischemic heart disease: Secondary | ICD-10-CM | POA: Diagnosis present

## 2023-09-21 DIAGNOSIS — Z8249 Family history of ischemic heart disease and other diseases of the circulatory system: Secondary | ICD-10-CM

## 2023-09-21 DIAGNOSIS — K219 Gastro-esophageal reflux disease without esophagitis: Secondary | ICD-10-CM | POA: Diagnosis present

## 2023-09-21 DIAGNOSIS — J4541 Moderate persistent asthma with (acute) exacerbation: Secondary | ICD-10-CM | POA: Diagnosis present

## 2023-09-21 DIAGNOSIS — Z0000 Encounter for general adult medical examination without abnormal findings: Secondary | ICD-10-CM

## 2023-09-21 DIAGNOSIS — I272 Pulmonary hypertension, unspecified: Secondary | ICD-10-CM | POA: Diagnosis present

## 2023-09-21 DIAGNOSIS — Z5941 Food insecurity: Secondary | ICD-10-CM

## 2023-09-21 DIAGNOSIS — Z79899 Other long term (current) drug therapy: Secondary | ICD-10-CM

## 2023-09-21 DIAGNOSIS — Z91013 Allergy to seafood: Secondary | ICD-10-CM

## 2023-09-21 DIAGNOSIS — E785 Hyperlipidemia, unspecified: Secondary | ICD-10-CM | POA: Diagnosis present

## 2023-09-21 DIAGNOSIS — J9601 Acute respiratory failure with hypoxia: Secondary | ICD-10-CM | POA: Diagnosis present

## 2023-09-21 DIAGNOSIS — F419 Anxiety disorder, unspecified: Secondary | ICD-10-CM | POA: Diagnosis present

## 2023-09-21 DIAGNOSIS — I5043 Acute on chronic combined systolic (congestive) and diastolic (congestive) heart failure: Secondary | ICD-10-CM | POA: Diagnosis present

## 2023-09-21 DIAGNOSIS — Z86718 Personal history of other venous thrombosis and embolism: Secondary | ICD-10-CM

## 2023-09-21 DIAGNOSIS — M7989 Other specified soft tissue disorders: Secondary | ICD-10-CM

## 2023-09-21 DIAGNOSIS — Z90711 Acquired absence of uterus with remaining cervical stump: Secondary | ICD-10-CM

## 2023-09-21 DIAGNOSIS — R451 Restlessness and agitation: Secondary | ICD-10-CM | POA: Diagnosis not present

## 2023-09-21 DIAGNOSIS — Z800 Family history of malignant neoplasm of digestive organs: Secondary | ICD-10-CM

## 2023-09-21 DIAGNOSIS — N185 Chronic kidney disease, stage 5: Secondary | ICD-10-CM | POA: Diagnosis present

## 2023-09-21 DIAGNOSIS — I160 Hypertensive urgency: Secondary | ICD-10-CM | POA: Diagnosis present

## 2023-09-21 DIAGNOSIS — Z886 Allergy status to analgesic agent status: Secondary | ICD-10-CM

## 2023-09-21 DIAGNOSIS — Z833 Family history of diabetes mellitus: Secondary | ICD-10-CM

## 2023-09-21 DIAGNOSIS — E872 Acidosis, unspecified: Secondary | ICD-10-CM | POA: Diagnosis present

## 2023-09-21 DIAGNOSIS — I132 Hypertensive heart and chronic kidney disease with heart failure and with stage 5 chronic kidney disease, or end stage renal disease: Principal | ICD-10-CM | POA: Diagnosis present

## 2023-09-21 DIAGNOSIS — Z803 Family history of malignant neoplasm of breast: Secondary | ICD-10-CM

## 2023-09-21 DIAGNOSIS — I509 Heart failure, unspecified: Principal | ICD-10-CM

## 2023-09-21 DIAGNOSIS — Z9842 Cataract extraction status, left eye: Secondary | ICD-10-CM

## 2023-09-21 DIAGNOSIS — N179 Acute kidney failure, unspecified: Secondary | ICD-10-CM | POA: Diagnosis present

## 2023-09-21 DIAGNOSIS — Z9841 Cataract extraction status, right eye: Secondary | ICD-10-CM

## 2023-09-21 DIAGNOSIS — Z801 Family history of malignant neoplasm of trachea, bronchus and lung: Secondary | ICD-10-CM

## 2023-09-21 DIAGNOSIS — D631 Anemia in chronic kidney disease: Secondary | ICD-10-CM | POA: Diagnosis present

## 2023-09-21 DIAGNOSIS — D509 Iron deficiency anemia, unspecified: Secondary | ICD-10-CM | POA: Diagnosis present

## 2023-09-21 DIAGNOSIS — R531 Weakness: Secondary | ICD-10-CM | POA: Diagnosis present

## 2023-09-21 DIAGNOSIS — E1122 Type 2 diabetes mellitus with diabetic chronic kidney disease: Secondary | ICD-10-CM | POA: Diagnosis present

## 2023-09-21 HISTORY — DX: Chronic kidney disease, stage 5: N18.5

## 2023-09-21 NOTE — Patient Instructions (Addendum)
 Ms. Kemerer , Thank you for taking time out of your busy schedule to complete your Annual Wellness Visit with me. I enjoyed our conversation and look forward to speaking with you again next year. I, as well as your care team,  appreciate your ongoing commitment to your health goals. Please review the following plan we discussed and let me know if I can assist you in the future. Your Game plan/ To Do List    Referrals: If you haven't heard from the office you've been referred to, please reach out to them at the phone provided.   Follow up Visits: Next Medicare AWV with our clinical staff: 09/29/24 @ 11:20 am   Have you seen your provider in the last 6 months (3 months if uncontrolled diabetes)? Yes Next Office Visit with your provider: Deferred  Clinician Recommendations:  Aim for 30 minutes of exercise or brisk walking, 6-8 glasses of water, and 5 servings of fruits and vegetables each day.       This is a list of the screening recommended for you and due dates:  Health Maintenance  Topic Date Due   Complete foot exam   Never done   DEXA scan (bone density measurement)  Never done   Eye exam for diabetics  06/10/2021   Hemoglobin A1C  12/28/2022   COVID-19 Vaccine (7 - 2024-25 season) 01/14/2023   Flu Shot  12/14/2023   DTaP/Tdap/Td vaccine (2 - Tdap) 04/03/2024   Medicare Annual Wellness Visit  09/20/2024   Pneumonia Vaccine  Completed   Zoster (Shingles) Vaccine  Completed   HPV Vaccine  Aged Out   Meningitis B Vaccine  Aged Out    Advanced directives: (Declined) Advance directive discussed with you today. Even though you declined this today, please call our office should you change your mind, and we can give you the proper paperwork for you to fill out. Advance Care Planning is important because it:  [x]  Makes sure you receive the medical care that is consistent with your values, goals, and preferences  [x]  It provides guidance to your family and loved ones and reduces their  decisional burden about whether or not they are making the right decisions based on your wishes.  Follow the link provided in your after visit summary or read over the paperwork we have mailed to you to help you started getting your Advance Directives in place. If you need assistance in completing these, please reach out to us  so that we can help you!  See attachments for Preventive Care and Fall Prevention Tips.

## 2023-09-21 NOTE — Progress Notes (Signed)
 Subjective:   [name] is a 89 y.o. who presents for a Medicare Wellness preventive visit.  As a reminder, Annual Wellness Visits don't include a physical exam, and some assessments may be limited, especially if this visit is performed virtually. We may recommend an in-person visit if needed.  Visit Complete: In person  VideoDeclined- This patient declined Interactive audio and Acupuncturist. Therefore the visit was completed with audio only.  Persons Participating in Visit: Patient assisted by Daughter.  AWV Questionnaire: No: Patient Medicare AWV questionnaire was not completed prior to this visit.  Cardiac Risk Factors include: advanced age (>89men, >33 women);hypertension     Objective:     Today's Vitals   09/21/23 1531  BP: 122/62  Pulse: 90  Temp: 98.8 F (37.1 C)  TempSrc: Oral  SpO2: 99%  Weight: 142 lb 9.6 oz (64.7 kg)  Height: 5\' 2"  (1.575 m)   Body mass index is 26.08 kg/m.     09/21/2023    3:57 PM 08/08/2023    9:00 AM 08/08/2023   12:03 AM 06/27/2023    1:30 PM 03/13/2023   12:30 PM 12/15/2022   10:25 AM 10/31/2022    6:41 AM  Advanced Directives  Does Patient Have a Medical Advance Directive? No No No No No No   Would patient like information on creating a medical advance directive? No - Patient declined Yes (Inpatient - patient requests chaplain consult to create a medical advance directive) No - Patient declined No - Patient declined  No - Patient declined No - Patient declined    Current Medications (verified) Outpatient Encounter Medications as of 09/21/2023  Medication Sig   acetaminophen  (TYLENOL ) 500 MG tablet Take 500 mg by mouth 2 (two) times daily as needed for mild pain (pain score 1-3) or headache.   albuterol  (PROVENTIL ) (2.5 MG/3ML) 0.083% nebulizer solution Take 3 mLs (2.5 mg total) by nebulization every 6 (six) hours as needed for shortness of breath or wheezing.   albuterol  (VENTOLIN  HFA) 108 (90 Base) MCG/ACT inhaler  USE 2 PUFFS EVERY 6 HOURS AS NEEDED FOR WHEEZING.   amLODipine  (NORVASC ) 10 MG tablet Take 1 tablet (10 mg total) by mouth daily. (Patient taking differently: Take 10 mg by mouth at bedtime.)   atropine 1 % ophthalmic solution Place 1 drop into both eyes 2 (two) times daily as needed. (Patient not taking: Reported on 09/11/2023)   Azelastine -Fluticasone  137-50 MCG/ACT SUSP PLACE ONE SPRAY IN EACH NOSTRIL TWICE DAILY (IN THE MORNING AND AT BEDTIME) (Patient not taking: No sig reported)   Budeson-Glycopyrrol-Formoterol  (BREZTRI  AEROSPHERE) 160-9-4.8 MCG/ACT AERO INHALE 2 PUFFS INTO THE LUNGS TWICE DAILY, IN THE MORNING & AT BEDTIME.   carvedilol  (COREG ) 12.5 MG tablet Take 1 tablet (12.5 mg total) by mouth 2 (two) times daily.   cetirizine (ZYRTEC) 10 MG tablet Take 10 mg by mouth daily as needed for allergies or rhinitis. (Patient not taking: Reported on 08/23/2023)   citalopram  (CELEXA ) 20 MG tablet Take 0.5 tablets (10 mg total) by mouth daily.   cloNIDine  (CATAPRES ) 0.1 MG tablet TAKE ONE TABLET BY MOUTH TWICE DAILY AS NEEDED for hypertension   diclofenac  Sodium (VOLTAREN ) 1 % GEL Apply 4 g topically 2 (two) times daily as needed (painful areas). (Patient not taking: Reported on 09/11/2023)   ferrous sulfate  325 (65 FE) MG EC tablet Take 1 tablet (325 mg total) by mouth daily with breakfast.   fluticasone  (FLONASE ) 50 MCG/ACT nasal spray Place 2 sprays into both nostrils as  needed for allergies or rhinitis.   hydrALAZINE  (APRESOLINE ) 100 MG tablet Take 1 tablet (100 mg total) by mouth 3 (three) times daily.   isosorbide  mononitrate (IMDUR ) 30 MG 24 hr tablet Take 1 tablet (30 mg total) by mouth daily.   LORazepam  (ATIVAN ) 0.5 MG tablet Take 1 tablet (0.5 mg total) by mouth 2 (two) times daily as needed for anxiety   montelukast  (SINGULAIR ) 10 MG tablet Take 1 tablet (10 mg total) by mouth daily.   oxyCODONE  (OXY IR/ROXICODONE ) 5 MG immediate release tablet Take 1 tablet (5 mg total) by mouth every 6  (six) hours as needed for severe pain (pain score 7-10). (Patient not taking: Reported on 08/14/2023)   pantoprazole  (PROTONIX ) 40 MG tablet Take 1 tablet (40 mg total) by mouth daily. (Patient not taking: Reported on 08/23/2023)   RESTASIS 0.05 % ophthalmic emulsion Place 1 drop into both eyes 2 (two) times daily. (Patient not taking: Reported on 09/11/2023)   sodium bicarbonate  650 MG tablet Take 2 tablets (1,300 mg total) by mouth 2 (two) times daily.   Spacer/Aero-Holding Chambers (AEROCHAMBER PLUS) inhaler Use as instructed (Patient not taking: Reported on 09/11/2023)   torsemide  40 MG TABS Take 40 mg by mouth daily. (Patient taking differently: Take 40 mg by mouth daily. 2 pills two times a day.)   vitamin B-12 (CYANOCOBALAMIN ) 1000 MCG tablet Take 1,000 mcg by mouth daily.   Vitamin D , Cholecalciferol , 25 MCG (1000 UT) TABS Take 1,000 Units by mouth daily.   No facility-administered encounter medications on file as of 09/21/2023.    Allergies (verified) Penicillins, Aspirin, and Fish allergy   History: Past Medical History:  Diagnosis Date   Acute superficial venous thrombosis of left lower extremity 03/27/2014   concern about hx and potential extensive on exam tenderness calf and low dose lovenox  40 qd 2-4 weekselevetion and close  fu advised .    Allergy    Anemia    Anxiety    Arthritis    "shoulders" (09/09/2012)   Asthma 09/08/2012   Carotid bruit    us  2018  low risk 1 - 39%   Cataract    bil cateracts removed   Chronic kidney disease    Clotting disorder (HCC)    blood clots in legs   Diabetes mellitus without complication (HCC)    "Borderline" per pt; being monitored.  no meds per pt   Diverticulosis    Dyspnea 04/27/2014   GERD (gastroesophageal reflux disease)    Headache(784.0)    "related to my high blood pressure" (09/09/2012)   Hiatal hernia    History of DVT (deep vein thrombosis)    "RLE" (09/09/2012) coumadine cant take asa so on plavix     HTN (hypertension)  09/08/2012   Hyperlipidemia    Lupus    "cured years ago" (09/09/2012)   Other dysphagia 02/11/2013   Syncope and collapse 01/21/2018   Varicose veins    Varicose veins of leg with complications 12/05/2010   Past Surgical History:  Procedure Laterality Date   ABDOMINAL HYSTERECTOMY     partial   BREAST EXCISIONAL BIOPSY Right    BREAST SURGERY     CARPAL TUNNEL RELEASE Right 2000's   CARPAL TUNNEL RELEASE Bilateral 10/21/2013   Procedure: BILATERAL  CARPAL TUNNEL RELEASE;  Surgeon: Kemp Patter, MD;  Location: [facility];  Service: Orthopedics;  Laterality: Bilateral;   COLONOSCOPY     SHOULDER OPEN ROTATOR CUFF REPAIR Bilateral 2000's   TRIGGER FINGER RELEASE Right  10/21/2013   Procedure: RELEASE TRIGGER FINGER/A-1 PULLEY RIGHT MIDDLE AND RIGHT RING;  Surgeon: Kemp Patter, MD;  Location: [facility];  Service: Orthopedics;  Laterality: Right;   UPPER GASTROINTESTINAL ENDOSCOPY     Family History  Problem Relation Age of Onset   Hypertension Mother    Lung cancer Mother    Hypertension Father    Lung cancer Father    Breast cancer Sister    Colon cancer Sister        ? 69' s dx - died in 32's   Breast cancer Sister    Hypertension Brother    Rectal cancer Brother    Stomach cancer Brother    Breast cancer Brother    Heart attack Daughter    Esophageal cancer Daughter    Hypertension Daughter    Diabetes Daughter    Breast cancer Paternal Aunt    Lung cancer Other        both parents    Pancreatic cancer Neg Hx    Social History   Socioeconomic History   Marital status: Single    Spouse name: Not on file   Number of children: 6   Years of education: 9   Highest education level: Not on file  Occupational History   Occupation: retired  Tobacco Use   Smoking status: Never   Smokeless tobacco: Never  Vaping Use   Vaping status: Never Used  Substance and Sexual Activity   Alcohol use: No   Drug use: No   Sexual activity: Not  Currently    Birth control/protection: Surgical  Other Topics Concern   Not on file  Social History Narrative   2 people living in the home.  Grand daughter.Up and down through the nightHad 7 children  2 deceased . 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  Neg tad    Left handed   Drinks caffeine prn    Granddaughter lives with her   One floor home   Social Drivers of Health   Financial Resource Strain: Low Risk  (09/21/2023)   Overall Financial Resource Strain (CARDIA)    Difficulty of Paying Living Expenses: Not hard at all  Food Insecurity: No Food Insecurity (09/21/2023)   Hunger Vital Sign    Worried About Running Out of Food in the Last Year: Never true    Ran Out of Food in the Last Year: Never true  Recent Concern: Food Insecurity - Food Insecurity Present (08/08/2023)   Hunger Vital Sign    Worried About Running Out of Food in the Last Year: Never true    Ran Out of Food in the Last Year: Sometimes true  Transportation Needs: No Transportation Needs (09/21/2023)   PRAPARE - Administrator, Civil Service (Medical): No    Lack of Transportation (Non-Medical): No  Physical Activity: Sufficiently Active (09/21/2023)   Exercise Vital Sign    Days of Exercise per Week: 5 days    Minutes of Exercise per Session: 30 min  Stress: No Stress Concern Present (09/21/2023)   [employer] of Occupational Health - Occupational Stress Questionnaire    Feeling of Stress : Not at all  Social Connections: Moderately Integrated (09/21/2023)   Social Connection and Isolation Panel [NHANES]    Frequency of Communication with Friends and Family: More than three times a week  Frequency of Social Gatherings with Friends and Family: More than three times a week    Attends Religious Services: More than 4 times per year    Active Member of [employer] or Organizations: Yes     Attends Banker Meetings: More than 4 times per year    Marital Status: Never married  Recent Concern: Social Connections - Moderately Isolated (08/08/2023)   Social Connection and Isolation Panel [NHANES]    Frequency of Communication with Friends and Family: Three times a week    Frequency of Social Gatherings with Friends and Family: More than three times a week    Attends Religious Services: More than 4 times per year    Active Member of [employer] or Organizations: No    Attends Engineer, structural: Never    Marital Status: Never married    Tobacco Counseling Counseling given: Not Answered    Clinical Intake:  Pre-visit preparation completed: Yes  Pain : No/denies pain     BMI - recorded: 26.08 Nutritional Status: BMI 25 -29 Overweight Nutritional Risks: None Diabetes: No  Lab Results  Component Value Date   HGBA1C 5.3 06/29/2022   HGBA1C 5.4 01/25/2022   HGBA1C 5.7 06/29/2021     How often do you need to have someone help you when you read instructions, pamphlets, or other written materials from your doctor or pharmacy?: 1 - Never  Interpreter Needed?: No  Information entered by :: Farris Hong LPN   Activities of Daily Living     09/21/2023    3:56 PM 08/08/2023    9:00 AM  In your present state of health, do you have any difficulty performing the following activities:  Hearing? 0 1  Vision? 0 0  Difficulty concentrating or making decisions? 0 1  Walking or climbing stairs? 1   Comment Uses a Cane   Dressing or bathing? 0   Doing errands, shopping? 0 1  Preparing Food and eating ? N   Using the Toilet? N   In the past six months, have you accidently leaked urine? N   Do you have problems with loss of bowel control? N   Managing your Medications? N   Managing your Finances? N   Housekeeping or managing your Housekeeping? N     Patient Care Team: Panosh, Joaquim Muir, MD as PCP - General (Internal Medicine) Nahser, Lela Purple, MD as  PCP - Cardiology (Cardiology) Rozelle Corning, Maricela Shoe, MD (Orthopedic Surgery) Sonja Playas, MD as Consulting Physician (Hematology) Leandra Pro, MD as Consulting Physician (Nephrology) Amedeo Jupiter, MD as Consulting Physician (Ophthalmology) Edna Gouty, MD as Referring Physician (Ophthalmology) Denson Flake, MD as Consulting Physician (Pulmonary Disease) Carnell Christian, [facility] (Pharmacist) Eloy Half, Md, MD (Cardiology) Alwin Baars, DO as Consulting Physician (Cardiology)  Indicate any recent Medical Services you may have received from other than [facility] providers in the past year (date may be approximate).     Assessment:    This is a routine wellness examination for Edynn.  Hearing/Vision screen Hearing Screening - Comments:: Denies hearing difficulties   Vision Screening - Comments:: Wears rx glasses - up to date with routine eye exams with  Dr Bernetta Brilliant   Goals Addressed               This Visit's Progress     Increase physical activity (pt-stated)        Remain Active       Depression Screen     09/21/2023  3:55 PM 01/16/2023   12:07 PM 12/01/2022   12:52 PM 04/27/2022    4:02 PM 09/26/2021   10:42 AM 07/21/2020   11:13 AM 05/21/2019   10:45 AM  PHQ 2/9 Scores  PHQ - 2 Score 0 0  0 1 0 0  PHQ- 9 Score  0  1 1 1    Exception Documentation   Other- indicate reason in comment box      Not completed   Granddaughter does assessment        Fall Risk     09/21/2023    3:57 PM 06/27/2023    1:36 PM 06/27/2023    1:30 PM 04/19/2023    2:02 PM 01/16/2023   12:06 PM  Fall Risk   Falls in the past year? 0 0 0 0 0  Number falls in past yr: 0 0 0  0  Injury with Fall? 0 0 0  0  Risk for fall due to : No Fall Risks    No Fall Risks  Follow up Falls prevention discussed;Falls evaluation completed Falls evaluation completed Falls evaluation completed  Falls evaluation completed    MEDICARE RISK AT HOME:  Medicare Risk at Home Any stairs in or around the home?: No If so, are  there any without handrails?: No Home free of loose throw rugs in walkways, pet beds, electrical cords, etc?: Yes Adequate lighting in your home to reduce risk of falls?: Yes Life alert?: No Use of a cane, walker or w/c?: Yes Grab bars in the bathroom?: No Shower chair or bench in shower?: Yes Elevated toilet seat or a handicapped toilet?: No  TIMED UP AND GO:  Was the test performed?  Yes  Length of time to ambulate 10 feet: 10 sec Gait slow and steady with assistive device  Cognitive Function: 6CIT completed    06/27/2023    3:00 PM  MMSE - Mini Mental State Exam  Orientation to time 0  Orientation to Place 2  Registration 3  Attention/ Calculation 0  Recall 0  Language- name 2 objects 2  Language- repeat 1  Language- follow 3 step command 3  Language- read & follow direction 1  Write a sentence 1  Copy design 0  Total score 13        09/21/2023    3:58 PM  6CIT Screen  What Year? 0 points  What month? 0 points  What time? 0 points  Count back from 20 0 points  Months in reverse 0 points  Repeat phrase 6 points  Total Score 6 points    Immunizations Immunization History  Administered Date(s) Administered   Fluad Quad(high Dose 65+) 01/17/2019, 02/16/2020, 01/27/2021, 01/25/2022   Fluad Trivalent(High Dose 65+) 01/16/2023   Influenza, High Dose Seasonal PF 02/17/2014, 03/01/2015, 02/21/2016, 03/12/2017, 01/31/2018   Influenza,inj,Quad PF,6+ Mos 01/20/2013   Moderna Sars-Covid-2 Vaccination 04/29/2020   PFIZER(Purple Top)SARS-COV-2 Vaccination 06/07/2019, 06/28/2019, 09/10/2020   Pfizer(Comirnaty)Fall Seasonal Vaccine 12 years and older 06/13/2022   Pneumococcal Conjugate-13 02/21/2016   Pneumococcal Polysaccharide-23 03/10/2020   Pneumococcal-Unspecified 01/13/2009   RSV,unspecified 06/13/2022   Td 04/03/2014   Unspecified SARS-COV-2 Vaccination 04/01/2021   Zoster Recombinant(Shingrix) 02/12/2019, 04/18/2019    Screening Tests Health Maintenance   Topic Date Due   FOOT EXAM  Never done   DEXA SCAN  Never done   OPHTHALMOLOGY EXAM  06/10/2021   HEMOGLOBIN A1C  12/28/2022   COVID-19 Vaccine (7 - 2024-25 season) 01/14/2023   INFLUENZA VACCINE  12/14/2023   DTaP/Tdap/Td (  2 - Tdap) 04/03/2024   Medicare Annual Wellness (AWV)  09/20/2024   Pneumonia Vaccine 53+ Years old  Completed   Zoster Vaccines- Shingrix  Completed   HPV VACCINES  Aged Out   Meningococcal B Vaccine  Aged Out    Health Maintenance  Health Maintenance Due  Topic Date Due   FOOT EXAM  Never done   DEXA SCAN  Never done   OPHTHALMOLOGY EXAM  06/10/2021   HEMOGLOBIN A1C  12/28/2022   COVID-19 Vaccine (7 - 2024-25 season) 01/14/2023   Health Maintenance Items Addressed: Hemoglobin A1C deferred  Additional Screening:  Vision Screening: Recommended annual ophthalmology exams for early detection of glaucoma and other disorders of the eye.  Dental Screening: Recommended annual dental exams for proper oral hygiene  Community Resource Referral / Chronic Care Management: CRR required this visit?  No   CCM required this visit?  No   Plan:    I have personally reviewed and noted the following in the patient's chart:   Medical and social history Use of alcohol, tobacco or illicit drugs  Current medications and supplements including opioid prescriptions. Patient is not currently taking opioid prescriptions. Functional ability and status Nutritional status Physical activity Advanced directives List of other physicians Hospitalizations, surgeries, and ER visits in previous 12 months Vitals Screenings to include cognitive, depression, and falls Referrals and appointments  In addition, I have reviewed and discussed with patient certain preventive protocols, quality metrics, and best practice recommendations. A written personalized care plan for preventive services as well as general preventive health recommendations were provided to patient.   Dewayne Ford, LPN   05/20/1094   After Visit Summary: (In Person-Printed) AVS printed and given to the patient  Notes: Nothing significant to report at this time.

## 2023-09-22 ENCOUNTER — Other Ambulatory Visit: Payer: Self-pay

## 2023-09-22 ENCOUNTER — Inpatient Hospital Stay (HOSPITAL_COMMUNITY)

## 2023-09-22 ENCOUNTER — Emergency Department (HOSPITAL_BASED_OUTPATIENT_CLINIC_OR_DEPARTMENT_OTHER)

## 2023-09-22 ENCOUNTER — Encounter (HOSPITAL_COMMUNITY): Payer: Self-pay | Admitting: Internal Medicine

## 2023-09-22 DIAGNOSIS — I214 Non-ST elevation (NSTEMI) myocardial infarction: Secondary | ICD-10-CM

## 2023-09-22 DIAGNOSIS — J984 Other disorders of lung: Secondary | ICD-10-CM | POA: Diagnosis not present

## 2023-09-22 DIAGNOSIS — Z79899 Other long term (current) drug therapy: Secondary | ICD-10-CM | POA: Diagnosis not present

## 2023-09-22 DIAGNOSIS — N281 Cyst of kidney, acquired: Secondary | ICD-10-CM | POA: Diagnosis not present

## 2023-09-22 DIAGNOSIS — I5043 Acute on chronic combined systolic (congestive) and diastolic (congestive) heart failure: Secondary | ICD-10-CM

## 2023-09-22 DIAGNOSIS — I160 Hypertensive urgency: Secondary | ICD-10-CM | POA: Diagnosis not present

## 2023-09-22 DIAGNOSIS — R079 Chest pain, unspecified: Secondary | ICD-10-CM | POA: Diagnosis not present

## 2023-09-22 DIAGNOSIS — E785 Hyperlipidemia, unspecified: Secondary | ICD-10-CM | POA: Diagnosis present

## 2023-09-22 DIAGNOSIS — I5020 Unspecified systolic (congestive) heart failure: Secondary | ICD-10-CM | POA: Diagnosis not present

## 2023-09-22 DIAGNOSIS — D631 Anemia in chronic kidney disease: Secondary | ICD-10-CM

## 2023-09-22 DIAGNOSIS — I509 Heart failure, unspecified: Secondary | ICD-10-CM | POA: Diagnosis not present

## 2023-09-22 DIAGNOSIS — R531 Weakness: Secondary | ICD-10-CM | POA: Diagnosis present

## 2023-09-22 DIAGNOSIS — N185 Chronic kidney disease, stage 5: Secondary | ICD-10-CM

## 2023-09-22 DIAGNOSIS — N189 Chronic kidney disease, unspecified: Secondary | ICD-10-CM | POA: Diagnosis not present

## 2023-09-22 DIAGNOSIS — N179 Acute kidney failure, unspecified: Secondary | ICD-10-CM | POA: Diagnosis not present

## 2023-09-22 DIAGNOSIS — K219 Gastro-esophageal reflux disease without esophagitis: Secondary | ICD-10-CM | POA: Diagnosis present

## 2023-09-22 DIAGNOSIS — Z833 Family history of diabetes mellitus: Secondary | ICD-10-CM | POA: Diagnosis not present

## 2023-09-22 DIAGNOSIS — Z86718 Personal history of other venous thrombosis and embolism: Secondary | ICD-10-CM | POA: Diagnosis not present

## 2023-09-22 DIAGNOSIS — Z886 Allergy status to analgesic agent status: Secondary | ICD-10-CM | POA: Diagnosis not present

## 2023-09-22 DIAGNOSIS — J4541 Moderate persistent asthma with (acute) exacerbation: Secondary | ICD-10-CM | POA: Diagnosis present

## 2023-09-22 DIAGNOSIS — E1122 Type 2 diabetes mellitus with diabetic chronic kidney disease: Secondary | ICD-10-CM | POA: Diagnosis present

## 2023-09-22 DIAGNOSIS — D649 Anemia, unspecified: Secondary | ICD-10-CM | POA: Diagnosis not present

## 2023-09-22 DIAGNOSIS — E8779 Other fluid overload: Secondary | ICD-10-CM | POA: Diagnosis not present

## 2023-09-22 DIAGNOSIS — E872 Acidosis, unspecified: Secondary | ICD-10-CM | POA: Diagnosis not present

## 2023-09-22 DIAGNOSIS — I132 Hypertensive heart and chronic kidney disease with heart failure and with stage 5 chronic kidney disease, or end stage renal disease: Secondary | ICD-10-CM | POA: Diagnosis not present

## 2023-09-22 DIAGNOSIS — I272 Pulmonary hypertension, unspecified: Secondary | ICD-10-CM | POA: Diagnosis present

## 2023-09-22 DIAGNOSIS — J90 Pleural effusion, not elsewhere classified: Secondary | ICD-10-CM | POA: Diagnosis not present

## 2023-09-22 DIAGNOSIS — Z9842 Cataract extraction status, left eye: Secondary | ICD-10-CM | POA: Diagnosis not present

## 2023-09-22 DIAGNOSIS — R918 Other nonspecific abnormal finding of lung field: Secondary | ICD-10-CM | POA: Diagnosis not present

## 2023-09-22 DIAGNOSIS — J810 Acute pulmonary edema: Secondary | ICD-10-CM | POA: Diagnosis not present

## 2023-09-22 DIAGNOSIS — Z8249 Family history of ischemic heart disease and other diseases of the circulatory system: Secondary | ICD-10-CM | POA: Diagnosis not present

## 2023-09-22 DIAGNOSIS — Z9841 Cataract extraction status, right eye: Secondary | ICD-10-CM | POA: Diagnosis not present

## 2023-09-22 DIAGNOSIS — I5021 Acute systolic (congestive) heart failure: Secondary | ICD-10-CM | POA: Diagnosis not present

## 2023-09-22 DIAGNOSIS — I2489 Other forms of acute ischemic heart disease: Secondary | ICD-10-CM | POA: Diagnosis not present

## 2023-09-22 DIAGNOSIS — J9601 Acute respiratory failure with hypoxia: Secondary | ICD-10-CM | POA: Diagnosis not present

## 2023-09-22 DIAGNOSIS — Z91013 Allergy to seafood: Secondary | ICD-10-CM | POA: Diagnosis not present

## 2023-09-22 DIAGNOSIS — Z880 Allergy status to penicillin: Secondary | ICD-10-CM | POA: Diagnosis not present

## 2023-09-22 LAB — CBC
HCT: 32 % — ABNORMAL LOW (ref 36.0–46.0)
Hemoglobin: 9.8 g/dL — ABNORMAL LOW (ref 12.0–15.0)
MCH: 29.8 pg (ref 26.0–34.0)
MCHC: 30.6 g/dL (ref 30.0–36.0)
MCV: 97.3 fL (ref 80.0–100.0)
Platelets: 198 10*3/uL (ref 150–400)
RBC: 3.29 MIL/uL — ABNORMAL LOW (ref 3.87–5.11)
RDW: 16.5 % — ABNORMAL HIGH (ref 11.5–15.5)
WBC: 7.2 10*3/uL (ref 4.0–10.5)
nRBC: 0 % (ref 0.0–0.2)

## 2023-09-22 LAB — URINALYSIS, ROUTINE W REFLEX MICROSCOPIC
Bacteria, UA: NONE SEEN
Bilirubin Urine: NEGATIVE
Glucose, UA: 50 mg/dL — AB
Ketones, ur: NEGATIVE mg/dL
Leukocytes,Ua: NEGATIVE
Nitrite: NEGATIVE
Protein, ur: >=300 mg/dL — AB
Specific Gravity, Urine: 1.01 (ref 1.005–1.030)
pH: 6 (ref 5.0–8.0)

## 2023-09-22 LAB — BASIC METABOLIC PANEL WITH GFR
Anion gap: 17 — ABNORMAL HIGH (ref 5–15)
BUN: 65 mg/dL — ABNORMAL HIGH (ref 8–23)
CO2: 20 mmol/L — ABNORMAL LOW (ref 22–32)
Calcium: 9.3 mg/dL (ref 8.9–10.3)
Chloride: 106 mmol/L (ref 98–111)
Creatinine, Ser: 5.9 mg/dL — ABNORMAL HIGH (ref 0.44–1.00)
GFR, Estimated: 6 mL/min — ABNORMAL LOW (ref >60)
Glucose, Bld: 121 mg/dL — ABNORMAL HIGH (ref 70–99)
Potassium: 4.5 mmol/L (ref 3.5–5.1)
Sodium: 142 mmol/L (ref 135–145)

## 2023-09-22 LAB — FERRITIN: Ferritin: 259 ng/mL (ref 11–307)

## 2023-09-22 LAB — HEMOGLOBIN A1C
Hgb A1c MFr Bld: 5.1 % (ref 4.8–5.6)
Mean Plasma Glucose: 99.67 mg/dL

## 2023-09-22 LAB — TROPONIN I (HIGH SENSITIVITY)
Troponin I (High Sensitivity): 186 ng/L (ref <18)
Troponin I (High Sensitivity): 203 ng/L (ref <18)
Troponin I (High Sensitivity): 200 ng/L (ref <18)

## 2023-09-22 LAB — IRON AND TIBC
Iron: 19 ug/dL — ABNORMAL LOW (ref 28–170)
Saturation Ratios: 10 % — ABNORMAL LOW (ref 10.4–31.8)
TIBC: 192 ug/dL — ABNORMAL LOW (ref 250–450)
UIBC: 173 ug/dL

## 2023-09-22 LAB — TROPONIN T, HIGH SENSITIVITY: Troponin T High Sensitivity: 165 ng/L (ref <19)

## 2023-09-22 LAB — CREATININE, URINE, RANDOM: Creatinine, Urine: 36 mg/dL

## 2023-09-22 LAB — PRO BRAIN NATRIURETIC PEPTIDE: Pro Brain Natriuretic Peptide: >35000 pg/mL — ABNORMAL HIGH (ref <300.0)

## 2023-09-22 LAB — SODIUM, URINE, RANDOM: Sodium, Ur: 116 mmol/L

## 2023-09-22 LAB — GLUCOSE, CAPILLARY: Glucose-Capillary: 110 mg/dL — ABNORMAL HIGH (ref 70–99)

## 2023-09-22 MED ORDER — LABETALOL HCL 5 MG/ML IV SOLN
10.0000 mg | Freq: Once | INTRAVENOUS | Status: AC
  Administered 2023-09-22: 10 mg via INTRAVENOUS
  Filled 2023-09-22: qty 4

## 2023-09-22 MED ORDER — ALBUTEROL SULFATE (2.5 MG/3ML) 0.083% IN NEBU
5.0000 mg | INHALATION_SOLUTION | Freq: Once | RESPIRATORY_TRACT | Status: AC
  Administered 2023-09-22: 5 mg via RESPIRATORY_TRACT

## 2023-09-22 MED ORDER — VITAMIN B-12 1000 MCG PO TABS
1000.0000 ug | ORAL_TABLET | Freq: Every day | ORAL | Status: DC
  Administered 2023-09-22 – 2023-09-26 (×5): 1000 ug via ORAL
  Filled 2023-09-22 (×5): qty 1

## 2023-09-22 MED ORDER — CARVEDILOL 12.5 MG PO TABS
12.5000 mg | ORAL_TABLET | Freq: Two times a day (BID) | ORAL | Status: DC
  Administered 2023-09-22 – 2023-09-26 (×9): 12.5 mg via ORAL
  Filled 2023-09-22 (×9): qty 1

## 2023-09-22 MED ORDER — LIDOCAINE 5 % EX PTCH
1.0000 | MEDICATED_PATCH | CUTANEOUS | Status: DC
  Administered 2023-09-22 – 2023-09-24 (×2): 1 via TRANSDERMAL
  Filled 2023-09-22 (×2): qty 1

## 2023-09-22 MED ORDER — SODIUM BICARBONATE 650 MG PO TABS
1300.0000 mg | ORAL_TABLET | Freq: Two times a day (BID) | ORAL | Status: DC
Start: 2023-09-22 — End: 2023-09-24
  Administered 2023-09-22 – 2023-09-24 (×5): 1300 mg via ORAL
  Filled 2023-09-22 (×5): qty 2

## 2023-09-22 MED ORDER — FUROSEMIDE 10 MG/ML IJ SOLN: 60.0000 mg | Freq: Two times a day (BID) | INTRAMUSCULAR | Status: DC

## 2023-09-22 MED ORDER — IPRATROPIUM-ALBUTEROL 0.5-2.5 (3) MG/3ML IN SOLN
3.0000 mL | Freq: Once | RESPIRATORY_TRACT | Status: AC
Start: 2023-09-22 — End: 2023-09-22
  Administered 2023-09-22: 3 mL via RESPIRATORY_TRACT
  Filled 2023-09-22: qty 3

## 2023-09-22 MED ORDER — PANTOPRAZOLE SODIUM 40 MG PO TBEC: 40.0000 mg | DELAYED_RELEASE_TABLET | Freq: Every day | ORAL | Status: DC

## 2023-09-22 MED ORDER — HYDRALAZINE HCL 20 MG/ML IJ SOLN
10.0000 mg | Freq: Once | INTRAMUSCULAR | Status: AC
  Administered 2023-09-22: 10 mg via INTRAVENOUS
  Filled 2023-09-22: qty 1

## 2023-09-22 MED ORDER — ISOSORBIDE MONONITRATE ER 30 MG PO TB24: 30.0000 mg | ORAL_TABLET | Freq: Every day | ORAL | Status: DC

## 2023-09-22 MED ORDER — CITALOPRAM HYDROBROMIDE 10 MG PO TABS
10.0000 mg | ORAL_TABLET | Freq: Every day | ORAL | Status: DC
  Administered 2023-09-22 – 2023-09-26 (×5): 10 mg via ORAL
  Filled 2023-09-22 (×5): qty 1

## 2023-09-22 MED ORDER — ALBUTEROL SULFATE (2.5 MG/3ML) 0.083% IN NEBU
INHALATION_SOLUTION | RESPIRATORY_TRACT | Status: AC
Start: 2023-09-22 — End: 2023-09-22
  Filled 2023-09-22: qty 6

## 2023-09-22 MED ORDER — HEPARIN SODIUM (PORCINE) 5000 UNIT/ML IJ SOLN
5000.0000 [IU] | Freq: Three times a day (TID) | INTRAMUSCULAR | Status: DC
  Administered 2023-09-22 – 2023-09-26 (×14): 5000 [IU] via SUBCUTANEOUS
  Filled 2023-09-22 (×14): qty 1

## 2023-09-22 MED ORDER — CHLORHEXIDINE GLUCONATE CLOTH 2 % EX PADS
6.0000 | MEDICATED_PAD | Freq: Every day | CUTANEOUS | Status: DC
  Administered 2023-09-23 – 2023-09-26 (×4): 6 via TOPICAL

## 2023-09-22 MED ORDER — ACETAMINOPHEN 325 MG PO TABS
650.0000 mg | ORAL_TABLET | Freq: Four times a day (QID) | ORAL | Status: DC | PRN
  Administered 2023-09-22 – 2023-09-26 (×7): 650 mg via ORAL
  Filled 2023-09-22 (×7): qty 2

## 2023-09-22 MED ORDER — ALBUTEROL SULFATE (2.5 MG/3ML) 0.083% IN NEBU: 2.5000 mg | INHALATION_SOLUTION | Freq: Four times a day (QID) | RESPIRATORY_TRACT | Status: DC | PRN

## 2023-09-22 MED ORDER — FUROSEMIDE 10 MG/ML IJ SOLN
100.0000 mg | Freq: Two times a day (BID) | INTRAVENOUS | Status: DC
  Administered 2023-09-22 – 2023-09-23 (×3): 100 mg via INTRAVENOUS
  Filled 2023-09-22 (×4): qty 10

## 2023-09-22 MED ORDER — FUROSEMIDE 10 MG/ML IJ SOLN: 40.0000 mg | Freq: Two times a day (BID) | INTRAMUSCULAR | Status: DC

## 2023-09-22 MED ORDER — HYDRALAZINE HCL 50 MG PO TABS
100.0000 mg | ORAL_TABLET | Freq: Three times a day (TID) | ORAL | Status: DC
  Administered 2023-09-22 – 2023-09-26 (×14): 100 mg via ORAL
  Filled 2023-09-22: qty 2
  Filled 2023-09-22: qty 4
  Filled 2023-09-22 (×13): qty 2

## 2023-09-22 MED ORDER — AMLODIPINE BESYLATE 10 MG PO TABS
10.0000 mg | ORAL_TABLET | Freq: Every day | ORAL | Status: DC
  Administered 2023-09-22 – 2023-09-25 (×4): 10 mg via ORAL
  Filled 2023-09-22 (×5): qty 1

## 2023-09-22 MED ORDER — BUDESON-GLYCOPYRROL-FORMOTEROL 160-9-4.8 MCG/ACT IN AERO
2.0000 | INHALATION_SPRAY | Freq: Two times a day (BID) | RESPIRATORY_TRACT | Status: DC
  Administered 2023-09-22 – 2023-09-26 (×7): 2 via RESPIRATORY_TRACT
  Filled 2023-09-22: qty 5.9

## 2023-09-22 MED ORDER — FUROSEMIDE 10 MG/ML IJ SOLN
40.0000 mg | Freq: Once | INTRAMUSCULAR | Status: AC
  Administered 2023-09-22: 40 mg via INTRAVENOUS
  Filled 2023-09-22: qty 4

## 2023-09-22 MED ORDER — ACETAMINOPHEN 650 MG RE SUPP: 650.0000 mg | Freq: Four times a day (QID) | RECTAL | Status: DC | PRN

## 2023-09-22 NOTE — ED Triage Notes (Signed)
 Asthma exacerbation. Has used inhaler and breathing treatments today with no improvement. Denies feeling ill otherwise- fatigue, congestion, sore throat.

## 2023-09-22 NOTE — Consult Note (Signed)
 Renal Service Consult Note [facility]  Mikhala Manry [place] 09/22/2023 Lynae Sandifer, MD Requesting Physician: Dr. Consuela Denier  Reason for Consult: Renal failure HPI: The patient is a 89 y.o. year-old w/ PMH as below who presented to ED early this morning reporting shortness of breath, asthma exacerbation. Has used inhaler and breathing treatments with no improvement.  Otherwise not acutely ill, no fevers.  History of CHF and CKD.  No recent leg swelling.  In the ED blood pressure 203/86, HR 95, RR 28.  Patient placed on 2 L nasal cannula for O2 sat 89% on room air.  Labs showed sodium 142, K+ 4.5, BUN 65 and creatinine 5.9.  BNP greater than 35,000.  HGB 9.8, WBC 7K.  Chest x-ray showed increased interstitial lung markings with multifocal areas of airspace disease bilaterally.  Patient was given albuterol , IV Lasix  40 mg, DuoNeb, IV hydralazine  and IV labetalol  in the ED.  Patient was admitted to [facility].  We are asked to see for renal failure.   Pt seen in room. Pt is vague historian. C/o SOB and leg swelling mainly. Family at bedside.    ROS - denies CP, no joint pain, no HA, no blurry vision, no rash, no diarrhea, no nausea/ vomiting  PMH: Anemia Anxiety Asthma Chronic kidney disease Diabetes type 2 History of DVT VTE HTN HL History of lupus   Past Surgical History  Past Surgical History:  Procedure Laterality Date   ABDOMINAL HYSTERECTOMY     partial   BREAST EXCISIONAL BIOPSY Right    BREAST SURGERY     CARPAL TUNNEL RELEASE Right 2000's   CARPAL TUNNEL RELEASE Bilateral 10/21/2013   Procedure: BILATERAL  CARPAL TUNNEL RELEASE;  Surgeon: Kemp Patter, MD;  Location: [facility];  Service: Orthopedics;  Laterality: Bilateral;   COLONOSCOPY     SHOULDER OPEN ROTATOR CUFF REPAIR Bilateral 2000's   TRIGGER FINGER RELEASE Right 10/21/2013   Procedure: RELEASE TRIGGER FINGER/A-1 PULLEY RIGHT MIDDLE AND RIGHT RING;  Surgeon: Kemp Patter, MD;   Location: [facility];  Service: Orthopedics;  Laterality: Right;   UPPER GASTROINTESTINAL ENDOSCOPY     Family History  Family History  Problem Relation Age of Onset   Hypertension Mother    Lung cancer Mother    Hypertension Father    Lung cancer Father    Breast cancer Sister    Colon cancer Sister        ? 30' s dx - died in 59's   Breast cancer Sister    Hypertension Brother    Rectal cancer Brother    Stomach cancer Brother    Breast cancer Brother    Heart attack Daughter    Esophageal cancer Daughter    Hypertension Daughter    Diabetes Daughter    Breast cancer Paternal Aunt    Lung cancer Other        both parents    Pancreatic cancer Neg Hx    Social History  reports that she has never smoked. She has never used smokeless tobacco. She reports that she does not drink alcohol and does not use drugs. Allergies  Allergies  Allergen Reactions   Penicillins Nausea And Vomiting   Asa [Aspirin] Other (See Comments)    Wheezing Acetaminophen  is OK    Fish Allergy Itching, Nausea And Vomiting and Swelling    Swelling of the face   Home medications Prior to Admission medications   Medication Sig Start Date End  Date Taking? Authorizing Provider  acetaminophen  (TYLENOL ) 500 MG tablet Take 500 mg by mouth 2 (two) times daily as needed for mild pain (pain score 1-3) or headache.   Yes [provider]  albuterol  (PROVENTIL ) (2.5 MG/3ML) 0.083% nebulizer solution Take 3 mLs (2.5 mg total) by nebulization every 6 (six) hours as needed for shortness of breath or wheezing. 08/03/22  Yes Starlene Eaton, FNP  albuterol  (VENTOLIN  HFA) 108 (90 Base) MCG/ACT inhaler USE 2 PUFFS EVERY 6 HOURS AS NEEDED FOR WHEEZING. 04/19/23  Yes Denson Flake, MD  amLODipine  (NORVASC ) 10 MG tablet Take 1 tablet (10 mg total) by mouth daily. Patient taking differently: Take 10 mg by mouth at bedtime. 11/07/22  Yes Maylene Spear, MD  Budeson-Glycopyrrol-Formoterol   (BREZTRI  AEROSPHERE) 160-9-4.8 MCG/ACT AERO INHALE 2 PUFFS INTO THE LUNGS TWICE DAILY, IN THE MORNING & AT BEDTIME. Patient taking differently: Inhale 2 puffs into the lungs 2 (two) times daily as needed (wheezing, shortness of breath). 04/19/23  Yes Denson Flake, MD  carvedilol  (COREG ) 12.5 MG tablet Take 1 tablet (12.5 mg total) by mouth 2 (two) times daily. 04/06/23  Yes Nahser, Lela Purple, MD  citalopram  (CELEXA ) 20 MG tablet Take 0.5 tablets (10 mg total) by mouth daily. Patient taking differently: Take 10 mg by mouth at bedtime. 08/07/23  Yes Panosh, Joaquim Muir, MD  cloNIDine  (CATAPRES ) 0.1 MG tablet TAKE ONE TABLET BY MOUTH TWICE DAILY AS NEEDED for hypertension Patient taking differently: Take 0.1 mg by mouth 2 (two) times daily as needed (SBP > 160). 08/07/23  Yes Panosh, Joaquim Muir, MD  ferrous sulfate  325 (65 FE) MG EC tablet Take 1 tablet (325 mg total) by mouth daily with breakfast. 12/15/14  Yes Panosh, Wanda K, MD  fluticasone  (FLONASE ) 50 MCG/ACT nasal spray Place 2 sprays into both nostrils as needed for allergies or rhinitis.   Yes [provider]  hydrALAZINE  (APRESOLINE ) 100 MG tablet Take 1 tablet (100 mg total) by mouth 3 (three) times daily. 05/21/23  Yes Nahser, Lela Purple, MD  isosorbide  mononitrate (IMDUR ) 30 MG 24 hr tablet Take 1 tablet (30 mg total) by mouth daily. 04/02/23 02/12/24 Yes Milford, Arlice Bene, FNP  LORazepam  (ATIVAN ) 0.5 MG tablet Take 1 tablet (0.5 mg total) by mouth 2 (two) times daily as needed for anxiety 09/16/23  Yes Webb, Padonda B, FNP  montelukast  (SINGULAIR ) 10 MG tablet Take 1 tablet (10 mg total) by mouth daily. Patient taking differently: Take 10 mg by mouth at bedtime. 04/19/23  Yes Denson Flake, MD  sodium bicarbonate  650 MG tablet Take 2 tablets (1,300 mg total) by mouth 2 (two) times daily. 08/13/23   Ghimire, Estil Heman, MD  Spacer/Aero-Holding Chambers (AEROCHAMBER PLUS) inhaler Use as instructed Patient not taking: Reported on 09/11/2023 05/26/16    Ethlyn Herd, MD  torsemide  40 MG TABS Take 40 mg by mouth daily. Patient taking differently: Take 40 mg by mouth daily. 2 pills two times a day. 08/14/23   Ghimire, Estil Heman, MD  vitamin B-12 (CYANOCOBALAMIN ) 1000 MCG tablet Take 1,000 mcg by mouth daily.    [provider]  Vitamin D , Cholecalciferol , 25 MCG (1000 UT) TABS Take 1,000 Units by mouth daily. 05/30/23   Jarold Merlin B, FNP     Vitals:   09/22/23 0630 09/22/23 0700 09/22/23 0730 09/22/23 0830  BP: (!) 173/72 (!) 175/61 (!) 181/69 (!) 192/65  Pulse: 80 78 85   Resp: 19 18 (!) 24 16  Temp:  TempSrc:    Oral  SpO2: 94% 94% 95% 97%   Exam Gen alert, no distress, elderly female No rash, cyanosis or gangrene Sclera anicteric, throat clear  +JVD  Chest scattered crackles bilat bases RRR no MRG Abd soft ntnd no mass or ascites +bs GU foley in place MS no joint effusions or deformity Ext 1-2+ bilat pretib edema, 1+ hip edema Neuro is alert, Ox 3 , nf     Renal-related home meds: Coreg  12.5 twice daily Clonidine  0.1 twice daily as needed Hydralazine  100 mg 3 times daily Torsemide  40 mg twice daily Amlodipine  10 mg daily Sodium bicarbonate  1300 mg twice daily  Date   Creat  eGFR (ml/min) 2010- 2018  1.1- 1.8 2019   1.85- 2.23 2020   1.56- 3.06  2021   2.31- 2.57 2022   2.26- 2.64 2023    2.28- 2.80 Feb- jun 2024  2.94- 3.56 12- 15 ml/min  Jul- dec 2024  3.68- 4.20 10- 11 ml/min March 2025  5.24- 5.77 7  09/06/23  5.50   09/22/23  5.90  6       UA pending  CXR 5/10 Chest: showed increased interstitial lung markings with multifocal areas of airspace disease bilaterally   BP 178/ 70, HR 80s, RR 16-20 better, on RA   IV lasix  40mg  given early am, later at 10:54am pt got IV lasix  100mg    BP 178/ 65, HR 78, RR 16- 20 ( 28 in ED)    Temp 98.8, wbc 7K     Assessment/ Plan: AKI on CKD 5: b/l creat 4.2- 5.2 from late 2024 and march 2025. Creat here is 5.90 in the setting of SOB, suspected fluid  overload. CXR shows bilat vasc congestion/ pulm edema most likely. Pt is on torsemide  40 bid at home. She has been started on IV lasix  here at 100mg  q 12 hrs. Not grossly uremic. Will need her office records. Will follow.  HTN: poorly controlled, cont home meds, get vol down w/ diuretics.  HFrEF: recent EF 35-40% in march NSTEMI: trops 100-200 range.       Larry Poag  MD [facility] 09/22/2023, 12:01 PM  Recent Labs  Lab 09/22/23 0036  HGB 9.8*  CALCIUM  9.3  CREATININE 5.90*  K 4.5   Inpatient medications:  albuterol        amLODipine   10 mg Oral QHS   budeson-glycopyrrolate-formoterol   2 puff Inhalation BID   carvedilol   12.5 mg Oral BID WC   citalopram   10 mg Oral Daily   cyanocobalamin   1,000 mcg Oral Daily   heparin   5,000 Units Subcutaneous Q8H   hydrALAZINE   100 mg Oral Q8H   sodium bicarbonate   1,300 mg Oral BID    furosemide  100 mg (09/22/23 1054)   acetaminophen  **OR** acetaminophen , albuterol , albuterol 

## 2023-09-22 NOTE — H&P (Signed)
 History and Physical    Patient: [name] RUE:[id number] DOB: 1933-11-15 DOA: 09/21/2023 DOS: the patient was seen and examined on 09/22/2023 PCP: Reginal Capra, MD  Patient coming from: Home > [facility] > [facility]  Chief Complaint: [facility]  HPI: [name] is a 89 y.o. female with medical history significant of moderate persistent asthma, chronic combined systolic and diastolic CHF, CKD stage V, anemia of CKD, hypertension, hyperlipidemia, GERD who presented to the ED with shortness of breath.  Patient states that she has been having worsening shortness of breath over the past 2 to 3 days.  She feels that she has been wheezing as well.  She thought that her symptoms were coming from her underlying asthma as she has required hospitalization for it in the past.  She used her home inhalers without much improvement in her symptoms.  She has also noted increasing leg swelling.  Given that her shortness of breath was not improving, this prompted her to come to the ED for further evaluation.  She denies any fevers, chills, nausea, vomiting, chest pain, palpitations, worsening cough, abdominal pain, urinary changes.  [facility]: Vital signs notable for tachypnea, hypertension to the 200s/80s, new oxygen requirement of 2 L [place].  CBC showing hemoglobin 9.8 (around her baseline).  BMP showing bicarbonate 20, glucose 121, creatinine 5.9 (baseline around 5.5-5.7), BUN 65, anion gap 17.  Troponin 165.  proBNP greater than 35,000.  Chest x-ray showing chronic appearing increased interstitial lung markings with multifocal areas of airspace disease bilaterally along with small bilateral pleural effusions (CT chest recommended for further evaluation). Patient given albuterol  nebs, IV Lasix  40 mg, DuoNebs, IV hydralazine  and IV labetalol  in the ED.  Patient transferred to [facility] for further evaluation and care.  Review of Systems: As mentioned in the history of present illness. All other systems  reviewed and are negative. Past Medical History:  Diagnosis Date   Acute superficial venous thrombosis of left lower extremity 03/27/2014   concern about hx and potential extensive on exam tenderness calf and low dose lovenox  40 qd 2-4 weekselevetion and close  fu advised .    Allergy    Anemia    Anxiety    Arthritis    "shoulders" (09/09/2012)   Asthma 09/08/2012   Carotid bruit    us  2018  low risk 1 - 39%   Cataract    bil cateracts removed   Chronic kidney disease    Clotting disorder (HCC)    blood clots in legs   Diabetes mellitus without complication (HCC)    "Borderline" per pt; being monitored.  no meds per pt   Diverticulosis    Dyspnea 04/27/2014   GERD (gastroesophageal reflux disease)    Headache(784.0)    "related to my high blood pressure" (09/09/2012)   Hiatal hernia    History of DVT (deep vein thrombosis)    "RLE" (09/09/2012) coumadine cant take asa so on plavix     HTN (hypertension) 09/08/2012   Hyperlipidemia    Lupus    "cured years ago" (09/09/2012)   Other dysphagia 02/11/2013   Syncope and collapse 01/21/2018   Varicose veins    Varicose veins of leg with complications 12/05/2010   Past Surgical History:  Procedure Laterality Date   ABDOMINAL HYSTERECTOMY     partial   BREAST EXCISIONAL BIOPSY Right    BREAST SURGERY     CARPAL TUNNEL RELEASE Right 2000's   CARPAL TUNNEL RELEASE Bilateral 10/21/2013   Procedure: BILATERAL  CARPAL TUNNEL RELEASE;  Surgeon: Kemp Patter, MD;  Location: [facility];  Service: Orthopedics;  Laterality: Bilateral;   COLONOSCOPY     SHOULDER OPEN ROTATOR CUFF REPAIR Bilateral 2000's   TRIGGER FINGER RELEASE Right 10/21/2013   Procedure: RELEASE TRIGGER FINGER/A-1 PULLEY RIGHT MIDDLE AND RIGHT RING;  Surgeon: Kemp Patter, MD;  Location: [facility];  Service: Orthopedics;  Laterality: Right;   UPPER GASTROINTESTINAL ENDOSCOPY     Social History:  reports that she has never smoked. She has  never used smokeless tobacco. She reports that she does not drink alcohol and does not use drugs.  Allergies  Allergen Reactions   Penicillins Nausea And Vomiting and Other (See Comments)    Has patient had a PCN reaction causing immediate rash, facial/tongue/throat swelling, SOB or lightheadedness with hypotension: Y Has patient had a PCN reaction causing severe rash involving mucus membranes or skin necrosis: Y Has patient had a PCN reaction that required hospitalization: N Has patient had a PCN reaction occurring within the last 10 years: N If all of the above answers are "NO", then may proceed with Cephalosporin use.    Aspirin Other (See Comments)    Wheezing Acetaminophen  is OK    Fish Allergy Itching, Nausea And Vomiting and Swelling    Swelling of the face    Family History  Problem Relation Age of Onset   Hypertension Mother    Lung cancer Mother    Hypertension Father    Lung cancer Father    Breast cancer Sister    Colon cancer Sister        ? 66' s dx - died in 7's   Breast cancer Sister    Hypertension Brother    Rectal cancer Brother    Stomach cancer Brother    Breast cancer Brother    Heart attack Daughter    Esophageal cancer Daughter    Hypertension Daughter    Diabetes Daughter    Breast cancer Paternal Aunt    Lung cancer Other        both parents    Pancreatic cancer Neg Hx     Prior to Admission medications   Medication Sig Start Date End Date Taking? Authorizing Provider  acetaminophen  (TYLENOL ) 500 MG tablet Take 500 mg by mouth 2 (two) times daily as needed for mild pain (pain score 1-3) or headache.    [provider]  albuterol  (PROVENTIL ) (2.5 MG/3ML) 0.083% nebulizer solution Take 3 mLs (2.5 mg total) by nebulization every 6 (six) hours as needed for shortness of breath or wheezing. 08/03/22   Starlene Eaton, FNP  albuterol  (VENTOLIN  HFA) 108 (90 Base) MCG/ACT inhaler USE 2 PUFFS EVERY 6 HOURS AS NEEDED FOR WHEEZING. 04/19/23    Denson Flake, MD  amLODipine  (NORVASC ) 10 MG tablet Take 1 tablet (10 mg total) by mouth daily. Patient taking differently: Take 10 mg by mouth at bedtime. 11/07/22   Krishnan, Gokul, MD  atropine 1 % ophthalmic solution Place 1 drop into both eyes 2 (two) times daily as needed. Patient not taking: Reported on 09/11/2023    [provider]  Azelastine -Fluticasone  137-50 MCG/ACT SUSP PLACE ONE SPRAY IN EACH NOSTRIL TWICE DAILY (IN THE MORNING AND AT BEDTIME) Patient not taking: No sig reported 03/12/23   Denson Flake, MD  Budeson-Glycopyrrol-Formoterol  (BREZTRI  AEROSPHERE) 160-9-4.8 MCG/ACT AERO INHALE 2 PUFFS INTO THE LUNGS TWICE DAILY, IN THE MORNING & AT BEDTIME. 04/19/23   Byrum, Delora Ferry,  MD  carvedilol  (COREG ) 12.5 MG tablet Take 1 tablet (12.5 mg total) by mouth 2 (two) times daily. 04/06/23   Nahser, Lela Purple, MD  cetirizine (ZYRTEC) 10 MG tablet Take 10 mg by mouth daily as needed for allergies or rhinitis. Patient not taking: Reported on 08/23/2023    [provider]  citalopram  (CELEXA ) 20 MG tablet Take 0.5 tablets (10 mg total) by mouth daily. 08/07/23   Panosh, Wanda K, MD  cloNIDine  (CATAPRES ) 0.1 MG tablet TAKE ONE TABLET BY MOUTH TWICE DAILY AS NEEDED for hypertension 08/07/23   Panosh, Wanda K, MD  diclofenac  Sodium (VOLTAREN ) 1 % GEL Apply 4 g topically 2 (two) times daily as needed (painful areas). Patient not taking: Reported on 09/11/2023 05/02/22   Gretel Leaven, PA-C  ferrous sulfate  325 (65 FE) MG EC tablet Take 1 tablet (325 mg total) by mouth daily with breakfast. 12/15/14   Panosh, Wanda K, MD  fluticasone  (FLONASE ) 50 MCG/ACT nasal spray Place 2 sprays into both nostrils as needed for allergies or rhinitis.    [provider]  hydrALAZINE  (APRESOLINE ) 100 MG tablet Take 1 tablet (100 mg total) by mouth 3 (three) times daily. 05/21/23   Nahser, Lela Purple, MD  isosorbide  mononitrate (IMDUR ) 30 MG 24 hr tablet Take 1 tablet (30 mg total) by mouth daily.  04/02/23 02/12/24  Elmarie Hacking, FNP  LORazepam  (ATIVAN ) 0.5 MG tablet Take 1 tablet (0.5 mg total) by mouth 2 (two) times daily as needed for anxiety 09/16/23   Webb, Padonda B, FNP  montelukast  (SINGULAIR ) 10 MG tablet Take 1 tablet (10 mg total) by mouth daily. 04/19/23   Denson Flake, MD  oxyCODONE  (OXY IR/ROXICODONE ) 5 MG immediate release tablet Take 1 tablet (5 mg total) by mouth every 6 (six) hours as needed for severe pain (pain score 7-10). Patient not taking: Reported on 08/14/2023 05/02/23   Panosh, Wanda K, MD  pantoprazole  (PROTONIX ) 40 MG tablet Take 1 tablet (40 mg total) by mouth daily. Patient not taking: Reported on 08/23/2023 04/19/23   Byrum, Robert S, MD  RESTASIS 0.05 % ophthalmic emulsion Place 1 drop into both eyes 2 (two) times daily. Patient not taking: Reported on 09/11/2023 10/13/21   [provider]  sodium bicarbonate  650 MG tablet Take 2 tablets (1,300 mg total) by mouth 2 (two) times daily. 08/13/23   Ghimire, Estil Heman, MD  Spacer/Aero-Holding Chambers (AEROCHAMBER PLUS) inhaler Use as instructed Patient not taking: Reported on 09/11/2023 05/26/16   Ethlyn Herd, MD  torsemide  40 MG TABS Take 40 mg by mouth daily. Patient taking differently: Take 40 mg by mouth daily. 2 pills two times a day. 08/14/23   Ghimire, Estil Heman, MD  vitamin B-12 (CYANOCOBALAMIN ) 1000 MCG tablet Take 1,000 mcg by mouth daily.    [provider]  Vitamin D , Cholecalciferol , 25 MCG (1000 UT) TABS Take 1,000 Units by mouth daily. 05/30/23   Rondall Codding, FNP    Physical Exam: Vitals:   09/22/23 0630 09/22/23 0700 09/22/23 0730 09/22/23 0830  BP: (!) 173/72 (!) 175/61 (!) 181/69 (!) 192/65  Pulse: 80 78 85   Resp: 19 18 (!) 24 16  Temp:      TempSrc:    Oral  SpO2: 94% 94% 95% 97%   Physical Exam Constitutional:      General: She is not in acute distress.    Appearance: She is not ill-appearing.     Comments: Chronically ill-appearing elderly female  HENT:  Head: Normocephalic and atraumatic.     Mouth/Throat:     Mouth: Mucous membranes are moist.     Pharynx: Oropharynx is clear. No oropharyngeal exudate.  Eyes:     General: No scleral icterus.    Extraocular Movements: Extraocular movements intact.     Conjunctiva/sclera: Conjunctivae normal.     Pupils: Pupils are equal, round, and reactive to light.  Cardiovascular:     Rate and Rhythm: Normal rate and regular rhythm.     Heart sounds: Normal heart sounds. No murmur heard.    No friction rub. No gallop.  Pulmonary:     Effort: Pulmonary effort is normal. No respiratory distress.     Breath sounds: Normal breath sounds. No wheezing or rhonchi.     Comments: Mild bibasilar crackles on exam. Saturating >95% on RA. Abdominal:     General: Bowel sounds are normal. There is no distension.     Palpations: Abdomen is soft.     Tenderness: There is no abdominal tenderness. There is no guarding or rebound.  Musculoskeletal:        General: Normal range of motion.     Cervical back: Normal range of motion.     Comments: 2-3+ pitting edema in bilateral LE up to knees.  Skin:    General: Skin is warm and dry.  Neurological:     General: No focal deficit present.     Mental Status: She is alert and oriented to person, place, and time.  Psychiatric:        Mood and Affect: Mood normal.        Behavior: Behavior normal.     Data Reviewed:  There are no new results to review at this time.    Latest Ref Rng & Units 09/22/2023   12:36 AM 09/11/2023    1:31 PM 09/06/2023   12:00 AM  CBC  WBC 4.0 - 10.5 K/uL 7.2  3.7  3.2      Hemoglobin 12.0 - 15.0 g/dL 9.8  7.9  8.4      Hematocrit 36.0 - 46.0 % 32.0  25.2    Platelets 150 - 400 K/uL 198  202       This result is from an external source.      Latest Ref Rng & Units 09/22/2023   12:36 AM 09/06/2023   12:00 AM 08/13/2023    8:24 AM  BMP  Glucose 70 - 99 mg/dL 161   096   BUN 8 - 23 mg/dL 65  78     94   Creatinine 0.44 - 1.00  mg/dL 0.45  5.5     4.09   Sodium 135 - 145 mmol/L 142  144     136   Potassium 3.5 - 5.1 mmol/L 4.5  4.7     3.8   Chloride 98 - 111 mmol/L 106  107     106   CO2 22 - 32 mmol/L 20  23     18    Calcium  8.9 - 10.3 mg/dL 9.3   7.7      This result is from an external source.   ProBNP    Component Value Date/Time   PROBNP >35,000.0 (H) 09/22/2023 0036   PROBNP 197.0 (H) 08/08/2017 1451   Troponin: 165  DG Chest Port 1 View Result Date: 09/22/2023 CLINICAL DATA:  Chest pain and shortness of breath. EXAM: PORTABLE CHEST 1 VIEW COMPARISON:  August 09, 2023 FINDINGS: The cardiac silhouette is enlarged and  unchanged in size. There is marked severity calcification of the aortic arch. Chronic appearing increased interstitial lung markings are seen with multifocal areas of airspace disease seen bilaterally. There are small bilateral pleural effusions. No pneumothorax is identified. Multilevel degenerative changes are seen throughout the thoracic spine. IMPRESSION: 1. Chronic appearing increased interstitial lung markings with multifocal areas of airspace disease bilaterally. Correlation with chest CT is recommended, as multifocal infiltrates cannot be excluded. 2. Small bilateral pleural effusions. Electronically Signed   By: Virgle Grime M.D.   On: 09/22/2023 03:13    Assessment and Plan: No notes have been filed under this hospital service. Service: Hospitalist  Acute on chronic combined systolic and diastolic HF Pulmonary hypertension Patient presenting with progressive shortness of breath for the past 2 to 3 days, found to be in CHF exacerbation.  She does have mild bibasilar crackles on exam and is with 2-3+ pitting edema up to her knees bilaterally.  proBNP greater than 35,000.  Most recent echocardiogram in March 2025 showing LVEF 35 to 40%, moderately decreased LV function, grade 3 diastolic dysfunction, moderately elevated pulmonary artery systolic pressure.  She is on torsemide  40 mg  daily, Coreg  12.5 mg twice daily, hydralazine  100 mg 3 times daily at home.  Imdur  listed in chart but has not been filled since November per pharmacy.  Suspect volume overload state likely also related to underlying kidney dysfunction given CKD stage V.  Nephrology has been consulted and will assist with diuresis, recommended transitioning to IV Lasix  100 mg twice daily.  Her Coreg  and hydralazine  were resumed overnight by ED provider and she is tolerating well. Nephrology recommended foley placement to trend urine output to ensure appropriate diuresis achieved.  She is currently hemodynamically stable. - Nephrology consulted, appreciate assistance - Status post IV Lasix  40 mg once, transitioning to IV Lasix  100 mg twice daily - Strict I/O's, daily weights - Follow-up echocardiogram - Continue home carvedilol  and hydralazine  - Telemetry - Insert Foley catheter to trend urine output with diuresis - Heart healthy diet - PT/OT eval - Supplemental oxygen as needed to maintain O2 sats greater than 92% - Heparin  subcutaneous for DVT prophylaxis - Admit to inpatient/progressive  CKD Stage V Anion gap metabolic acidosis secondary to renal dysfunction Patient with baseline creatinine around 5.5-5.7.  On admission, creatinine 5.9 with GFR 6.  She does also have an anion gap metabolic acidosis stemming from her kidney dysfunction.  Nephrology has been consulted given severe kidney dysfunction and need for diuresis. -Nephrology consulted, appreciate assistance -Diuresis as noted above -Resume home sodium bicarb 1300 mg twice daily - Closely monitor urine output - Avoid nephrotoxic medications as able - Trend kidney function  Hypertensive urgency Patient with blood pressures ranging in the 170s-200s/80s since arrival.  She has received IV labetalol  and IV hydralazine  in the ED prior to arrival to [facility]  Her home carvedilol  and hydralazine  doses have also been resumed by ED provider.  She is on  carvedilol  12.5 mg twice daily, hydralazine  100 mg 3 times daily, amlodipine  10 mg daily, clonidine  0.1 mg twice daily as needed. - Continue home carvedilol  and hydralazine  - Resume home amlodipine  - Trend BP curve - Consider addition of home clonidine  or as needed IV antihypertensives if BP consistently above 195/100  NSTEMI Patient with troponin T elevated to 165.  EKG showing sinus rhythm.  She is without any chest pain.  Suspect elevated troponin is secondary to demand ischemia stemming from her volume overload state.  Will continue  to trend troponins until peak. - Trend troponins until peak - Telemetry  Moderate persistent asthma Patient without any wheezing on exam.  I suspect that her shortness of breath is stemming from CHF exacerbation as opposed to asthma exacerbation.  She is currently saturating well on room air.  Have resumed her home albuterol  nebulizers and Breztri .  Chest x-ray did note chronic increased interstitial lung markings with multifocal areas of airspace disease bilaterally, obtaining CT chest for further evaluation.  Low suspicion for pneumonia given she is afebrile and without leukocytosis. - Resume home albuterol  nebs every 6 hours as needed, breztri  twice daily - f/u CT chest - Supplemental oxygen as needed to maintain O2 sats greater than 92%  Anemia of CKD Baseline hemoglobin around 9-10.  On admission, hemoglobin 9.8.  No reported bleeding events per patient.  She is not on an ESA in the outpatient setting. - Trend hemoglobin curve, transfuse if hemoglobin less than 7 - Follow-up iron studies - consider addition of ESA in the outpatient setting (defer to nephrology)  GERD - reported not taking protonix  since April  Hyperlipidemia - Not on any medications for this in the outpatient setting    Advance Care Planning:   Code Status: Full Code   Consults: nephrology  Family Communication: no family at bedside  Severity of Illness: The appropriate  patient status for this patient is INPATIENT. Inpatient status is judged to be reasonable and necessary in order to provide the required intensity of service to ensure the patient's safety. The patient's presenting symptoms, physical exam findings, and initial radiographic and laboratory data in the context of their chronic comorbidities is felt to place them at high risk for further clinical deterioration. Furthermore, it is not anticipated that the patient will be medically stable for discharge from the hospital within 2 midnights of admission.   * I certify that at the point of admission it is my clinical judgment that the patient will require inpatient hospital care spanning beyond 2 midnights from the point of admission due to high intensity of service, high risk for further deterioration and high frequency of surveillance required.*  Portions of this note were generated with Dragon dictation software. Dictation errors may occur despite best attempts at proofreading.  Author: Mandy Second, MD 09/22/2023 10:09 AM  For on call review [email or web address].

## 2023-09-22 NOTE — ED Provider Notes (Signed)
 [facility] Emergency Department Provider Note MRN:  [id number]  Arrival date & time: 09/22/23     Chief Complaint   Shortness of breath History of Present Illness   [name] is a 89 y.o. year-old female with a history of CKD, CHF presenting to the ED with chief complaint of shortness of breath.  Persistent shortness of breath over the past 2 or 3 days.  She attributes the symptoms to her asthma.  Endorsing some occasional chest discomfort as well.  Some increased cough.  Denies fever, no abdominal pain, no recent leg pain or swelling.  Review of Systems  A thorough review of systems was obtained and all systems are negative except as noted in the HPI and PMH.   Patient's Health History    Past Medical History:  Diagnosis Date   Acute superficial venous thrombosis of left lower extremity 03/27/2014   concern about hx and potential extensive on exam tenderness calf and low dose lovenox  40 qd 2-4 weekselevetion and close  fu advised .    Allergy    Anemia    Anxiety    Arthritis    "shoulders" (09/09/2012)   Asthma 09/08/2012   Carotid bruit    us  2018  low risk 1 - 39%   Cataract    bil cateracts removed   Chronic kidney disease    Clotting disorder (HCC)    blood clots in legs   Diabetes mellitus without complication (HCC)    "Borderline" per pt; being monitored.  no meds per pt   Diverticulosis    Dyspnea 04/27/2014   GERD (gastroesophageal reflux disease)    Headache(784.0)    "related to my high blood pressure" (09/09/2012)   Hiatal hernia    History of DVT (deep vein thrombosis)    "RLE" (09/09/2012) coumadine cant take asa so on plavix     HTN (hypertension) 09/08/2012   Hyperlipidemia    Lupus    "cured years ago" (09/09/2012)   Other dysphagia 02/11/2013   Syncope and collapse 01/21/2018   Varicose veins    Varicose veins of leg with complications 12/05/2010    Past Surgical History:  Procedure Laterality Date   ABDOMINAL  HYSTERECTOMY     partial   BREAST EXCISIONAL BIOPSY Right    BREAST SURGERY     CARPAL TUNNEL RELEASE Right 2000's   CARPAL TUNNEL RELEASE Bilateral 10/21/2013   Procedure: BILATERAL  CARPAL TUNNEL RELEASE;  Surgeon: Kemp Patter, MD;  Location: [facility];  Service: Orthopedics;  Laterality: Bilateral;   COLONOSCOPY     SHOULDER OPEN ROTATOR CUFF REPAIR Bilateral 2000's   TRIGGER FINGER RELEASE Right 10/21/2013   Procedure: RELEASE TRIGGER FINGER/A-1 PULLEY RIGHT MIDDLE AND RIGHT RING;  Surgeon: Kemp Patter, MD;  Location: [facility];  Service: Orthopedics;  Laterality: Right;   UPPER GASTROINTESTINAL ENDOSCOPY      Family History  Problem Relation Age of Onset   Hypertension Mother    Lung cancer Mother    Hypertension Father    Lung cancer Father    Breast cancer Sister    Colon cancer Sister        ? 29' s dx - died in 35's   Breast cancer Sister    Hypertension Brother    Rectal cancer Brother    Stomach cancer Brother    Breast cancer Brother    Heart attack Daughter    Esophageal cancer Daughter    Hypertension Daughter  Diabetes Daughter    Breast cancer Paternal Aunt    Lung cancer Other        both parents    Pancreatic cancer Neg Hx     Social History   Socioeconomic History   Marital status: Single    Spouse name: Not on file   Number of children: 6   Years of education: 9   Highest education level: Not on file  Occupational History   Occupation: retired  Tobacco Use   Smoking status: Never   Smokeless tobacco: Never  Vaping Use   Vaping status: Never Used  Substance and Sexual Activity   Alcohol use: No   Drug use: No   Sexual activity: Not Currently    Birth control/protection: Surgical  Other Topics Concern   Not on file  Social History Narrative   2 people living in the home.  Grand daughter.Up and down through the nightHad 7 children  2 deceased . 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  Neg tad    Left handed   Drinks caffeine prn    Granddaughter lives with her   One floor home   Social Drivers of Health   Financial Resource Strain: Low Risk  (09/21/2023)   Overall Financial Resource Strain (CARDIA)    Difficulty of Paying Living Expenses: Not hard at all  Food Insecurity: No Food Insecurity (09/21/2023)   Hunger Vital Sign    Worried About Running Out of Food in the Last Year: Never true    Ran Out of Food in the Last Year: Never true  Recent Concern: Food Insecurity - Food Insecurity Present (08/08/2023)   Hunger Vital Sign    Worried About Running Out of Food in the Last Year: Never true    Ran Out of Food in the Last Year: Sometimes true  Transportation Needs: No Transportation Needs (09/21/2023)   PRAPARE - Administrator, Civil Service (Medical): No    Lack of Transportation (Non-Medical): No  Physical Activity: Sufficiently Active (09/21/2023)   Exercise Vital Sign    Days of Exercise per Week: 5 days    Minutes of Exercise per Session: 30 min  Stress: No Stress Concern Present (09/21/2023)   [employer] of Occupational Health - Occupational Stress Questionnaire    Feeling of Stress : Not at all  Social Connections: Moderately Integrated (09/21/2023)   Social Connection and Isolation Panel [NHANES]    Frequency of Communication with Friends and Family: More than three times a week    Frequency of Social Gatherings with Friends and Family: More than three times a week    Attends Religious Services: More than 4 times per year    Active Member of [employer] or Organizations: Yes    Attends Banker Meetings: More than 4 times per year    Marital Status: Never married  Recent Concern: Social Connections - Moderately Isolated (08/08/2023)   Social Connection and Isolation Panel [NHANES]    Frequency of Communication with Friends and Family:  Three times a week    Frequency of Social Gatherings with Friends and Family: More than three times a week    Attends Religious Services: More than 4 times per year    Active Member of Clubs or Organizations: No    Attends Banker Meetings: Never    Marital Status: Never  married  Intimate Partner Violence: Not At Risk (09/21/2023)   Humiliation, Afraid, Rape, and Kick questionnaire    Fear of Current or Ex-Partner: No    Emotionally Abused: No    Physically Abused: No    Sexually Abused: No     Physical Exam   Vitals:   09/22/23 0415 09/22/23 0430  BP: (!) 178/87 (!) 187/82  Pulse: 82 82  Resp: (!) 27 (!) 25  Temp:    SpO2: 94% 94%    CONSTITUTIONAL: Well-appearing, NAD NEURO/PSYCH:  Alert and oriented x 3, no focal deficits EYES:  eyes equal and reactive ENT/NECK:  no LAD, no JVD CARDIO: Regular rate, well-perfused, normal S1 and S2 PULM:  CTAB no wheezing or rhonchi GI/GU:  non-distended, non-tender MSK/SPINE:  No gross deformities, no edema SKIN:  no rash, atraumatic   *Additional and/or pertinent findings included in MDM below  Diagnostic and Interventional Summary    EKG Interpretation Date/Time:  Maintenance 2025 at 00:08:15 Ventricular Rate:   94 PR Interval:   55 QRS Duration:   92 QT Interval:   393 QTC Calculation:  492 R Axis:      Text Interpretation: Sinus rhythm, nonspecific findings       Labs Reviewed  CBC - Abnormal; Notable for the following components:      Result Value   RBC 3.29 (*)    Hemoglobin 9.8 (*)    HCT 32.0 (*)    RDW 16.5 (*)    All other components within normal limits  BASIC METABOLIC PANEL WITH GFR - Abnormal; Notable for the following components:   CO2 20 (*)    Glucose, Bld 121 (*)    BUN 65 (*)    Creatinine, Ser 5.90 (*)    GFR, Estimated 6 (*)    Anion gap 17 (*)    All other components within normal limits  PRO BRAIN NATRIURETIC PEPTIDE - Abnormal; Notable for the following components:   Pro Brain  Natriuretic Peptide >35,000.0 (*)    All other components within normal limits  TROPONIN T, HIGH SENSITIVITY - Abnormal; Notable for the following components:   Troponin T High Sensitivity 165 (*)    All other components within normal limits    DG Chest Port 1 View  Final Result      Medications  albuterol  (PROVENTIL ) (2.5 MG/3ML) 0.083% nebulizer solution (  Not Given 09/22/23 0019)  hydrALAZINE  (APRESOLINE ) injection 10 mg (has no administration in time range)  albuterol  (PROVENTIL ) (2.5 MG/3ML) 0.083% nebulizer solution 5 mg (5 mg Nebulization Given 09/22/23 0012)  labetalol  (NORMODYNE ) injection 10 mg (10 mg Intravenous Given 09/22/23 0211)  furosemide  (LASIX ) injection 40 mg (40 mg Intravenous Given 09/22/23 0232)  labetalol  (NORMODYNE ) injection 10 mg (10 mg Intravenous Given 09/22/23 0320)  ipratropium-albuterol  (DUONEB) 0.5-2.5 (3) MG/3ML nebulizer solution 3 mL (3 mLs Nebulization Given 09/22/23 0440)     Procedures  /  Critical Care .Critical Care  Performed by: Edson Graces, MD Authorized by: Edson Graces, MD   Critical care provider statement:    Critical care time (minutes):  35   Critical care was necessary to treat or prevent imminent or life-threatening deterioration of the following conditions: NSTEMI, type II.   Critical care was time spent personally by me on the following activities:  Development of treatment plan with patient or surrogate, discussions with consultants, evaluation of patient's response to treatment, examination of patient, ordering and review of laboratory studies, ordering and review of radiographic studies,  ordering and performing treatments and interventions, pulse oximetry, re-evaluation of patient's condition and review of old charts   ED Course and Medical Decision Making  Initial Impression and Ddx Patient is sitting comfortably in no acute distress.  Hypertensive on arrival otherwise reassuring vital signs.  Has some scattered rhonchi and  wheezing on exam.  Per chart review she had an admission back in March for CHF complicated by pretty advanced CKD.  From what I am reading, it sounds like patient is not interested in dialysis and so they began discussing goals of care and palliative options.  Differential diagnosis at this time includes asthma, CHF, worsening CKD.  Overall low concern for PE.  Past medical/surgical history that increases complexity of ED encounter: CHF, sick ED  Interpretation of Diagnostics I personally reviewed the EKG and my interpretation is as follows: Sinus rhythm  No significant blood count or electrolyte disturbance.  Elevated troponin and markedly elevated BNP  Patient Reassessment and Ultimate Disposition/Management     Patient requiring 2 L oxygen to maintain her saturations above 90%.  Requesting hospitalist admission.  Patient management required discussion with the following services or consulting groups:  Hospitalist Service  Complexity of Problems Addressed Acute illness or injury that poses threat of life of bodily function  Additional Data Reviewed and Analyzed Further history obtained from: Further history from spouse/family member  Additional Factors Impacting ED Encounter Risk Consideration of hospitalization  Merrick Abe. Harless Lien, MD [facility] Emergency Medicine [facility] [email or web address]  Final Clinical Impressions(s) / ED Diagnoses     ICD-10-CM   1. Acute on chronic congestive heart failure, unspecified heart failure type (HCC)  I50.9       ED Discharge Orders     None        Discharge Instructions Discussed with and Provided to Patient:   Discharge Instructions   None      Edson Graces, MD 09/22/23 [phone]

## 2023-09-22 NOTE — ED Notes (Signed)
 Called to [facility] and given to [employer]

## 2023-09-22 NOTE — Plan of Care (Signed)
 Plan of Care Note for accepted transfer   Patient name: [name] HYQ:[id number] DOB: 12-25-1933  Facility requesting transfer: [facility] Requesting Provider: Dr. Harless Lien Facility course: 89 year old female with history of hypertension, type 2 diabetes, chronic combined CHF (EF 35 to 40% and grade 3 diastolic dysfunction on echo 08/08/2023), CKD stage V, normocytic anemia, asthma, hyperlipidemia presented to the ED with shortness of breath and wheezing.  Hypoxic to 83% on room air and placed on 2 L .  Hypertensive with SBP initially in the 200s.  Labs notable for hemoglobin 9.8 (stable), BUN 65, creatinine 5.9, GFR 6, potassium 4.5, bicarb 20, troponin 165, proBNP >35,000.  Chest x-ray concerning for pulmonary edema (radiologist read pending).  EKG showing sinus rhythm and baseline wander/artifact in multiple leads.  Patient is not having chest pain. She was given albuterol  neb, IV Lasix  40 mg, and IV labetalol  10 mg.  Most recent SBP in the 180s.  Plan of care: The patient is accepted for admission to Progressive unit at [facility]  [facility] will assume care on arrival to accepting facility. Until arrival, care as per EDP. However, [facility] available 24/7 for questions and assistance.  Check [email or web address] for on-call coverage.  Nursing staff, please call [facility] Admits & Consults System-Wide number under Amion on patient's arrival so appropriate admitting provider can evaluate the pt.

## 2023-09-22 NOTE — ED Notes (Signed)
 Pt placed on 2L NC

## 2023-09-23 ENCOUNTER — Inpatient Hospital Stay (HOSPITAL_COMMUNITY)

## 2023-09-23 ENCOUNTER — Encounter (HOSPITAL_COMMUNITY): Payer: Self-pay | Admitting: Internal Medicine

## 2023-09-23 DIAGNOSIS — I5043 Acute on chronic combined systolic (congestive) and diastolic (congestive) heart failure: Secondary | ICD-10-CM | POA: Diagnosis not present

## 2023-09-23 DIAGNOSIS — I5021 Acute systolic (congestive) heart failure: Secondary | ICD-10-CM

## 2023-09-23 LAB — RENAL FUNCTION PANEL
Albumin: 2.4 g/dL — ABNORMAL LOW (ref 3.5–5.0)
Anion gap: 14 (ref 5–15)
BUN: 68 mg/dL — ABNORMAL HIGH (ref 8–23)
CO2: 20 mmol/L — ABNORMAL LOW (ref 22–32)
Calcium: 8.5 mg/dL — ABNORMAL LOW (ref 8.9–10.3)
Chloride: 106 mmol/L (ref 98–111)
Creatinine, Ser: 5.8 mg/dL — ABNORMAL HIGH (ref 0.44–1.00)
GFR, Estimated: 7 mL/min — ABNORMAL LOW (ref >60)
Glucose, Bld: 113 mg/dL — ABNORMAL HIGH (ref 70–99)
Phosphorus: 6.3 mg/dL — ABNORMAL HIGH (ref 2.5–4.6)
Potassium: 4.3 mmol/L (ref 3.5–5.1)
Sodium: 140 mmol/L (ref 135–145)

## 2023-09-23 LAB — CBC
HCT: 27.3 % — ABNORMAL LOW (ref 36.0–46.0)
Hemoglobin: 8.2 g/dL — ABNORMAL LOW (ref 12.0–15.0)
MCH: 29.3 pg (ref 26.0–34.0)
MCHC: 30 g/dL (ref 30.0–36.0)
MCV: 97.5 fL (ref 80.0–100.0)
Platelets: 162 10*3/uL (ref 150–400)
RBC: 2.8 MIL/uL — ABNORMAL LOW (ref 3.87–5.11)
RDW: 16.3 % — ABNORMAL HIGH (ref 11.5–15.5)
WBC: 4.8 10*3/uL (ref 4.0–10.5)
nRBC: 0.6 % — ABNORMAL HIGH (ref 0.0–0.2)

## 2023-09-23 LAB — ECHOCARDIOGRAM COMPLETE
Area-P 1/2: 7.66 cm2
Height: 62 in
MV M vel: 5.8 m/s
MV Peak grad: 134.6 mmHg
Radius: 0.7 cm
S' Lateral: 3.5 cm
Weight: 2215.18 [oz_av]

## 2023-09-23 LAB — GLUCOSE, CAPILLARY
Glucose-Capillary: 129 mg/dL — ABNORMAL HIGH (ref 70–99)
Glucose-Capillary: 147 mg/dL — ABNORMAL HIGH (ref 70–99)
Glucose-Capillary: 128 mg/dL — ABNORMAL HIGH (ref 70–99)

## 2023-09-23 MED ORDER — FUROSEMIDE 10 MG/ML IJ SOLN
120.0000 mg | Freq: Three times a day (TID) | INTRAVENOUS | Status: DC
  Administered 2023-09-23 – 2023-09-24 (×3): 120 mg via INTRAVENOUS
  Filled 2023-09-23: qty 10
  Filled 2023-09-23: qty 2
  Filled 2023-09-23: qty 10
  Filled 2023-09-23: qty 12

## 2023-09-23 MED ORDER — MUSCLE RUB 10-15 % EX CREA
TOPICAL_CREAM | CUTANEOUS | Status: DC | PRN
  Administered 2023-09-23: 1 via TOPICAL
  Filled 2023-09-23: qty 85

## 2023-09-23 NOTE — Plan of Care (Signed)

## 2023-09-23 NOTE — Progress Notes (Signed)
 OT Cancellation Note  Patient Details Name: [name] MRN: [id number] DOB: 25-Feb-1934   Cancelled Treatment:    Reason Eval/Treat Not Completed: Other (comment) (Pt with several family members visiting to celebrate Mother's Day and wanting to visit with family. OT to reattempt to see pt at a later time as appropriate/available.)  [name] "[name]" M., OTR/L, MA Acute Rehab [phone]  Walt Gunner 09/23/2023, 4:27 PM

## 2023-09-23 NOTE — Progress Notes (Signed)
 [facility] Progress Note  Subjective:  400 cc UOP yest 450 cc UOP today Creat 5.8 today , down from 5.9  Vitals:   09/23/23 0442 09/23/23 0620 09/23/23 0734 09/23/23 1215  BP: (!) 167/59  (!) 145/60 (!) 164/62  Pulse: 69  68   Resp: 16  18 17   Temp: 98 F (36.7 C)  97.6 F (36.4 C) 98.3 F (36.8 C)  TempSrc: Oral  Oral Oral  SpO2: 100%  94% 96%  Weight:  62.8 kg      Exam: Gen no distress, elderly female No rash, cyanosis or gangrene Sclera anicteric, throat clear  +JVD  Chest clear bilat  RRR no MRG Abd soft ntnd no mass or ascites +bs GU foley in place Ext 1-2+ bilat pretib edema, 1+ hip edema Neuro alert, Ox 3 , nf        Renal-related home meds: Coreg  12.5 twice daily Clonidine  0.1 twice daily as needed Hydralazine  100 mg 3 times daily Torsemide  40 mg twice daily Amlodipine  10 mg daily Sodium bicarbonate  1300 mg twice daily   Date                             Creat               eGFR (ml/min) 2010- 2018                  1.1- 1.8 2019                            1.85- 2.23 2020                            1.56- 3.06         2021                            2.31- 2.57 2022                            2.26- 2.64 2023                            2.28- 2.80 Feb- jun 2024              2.94- 3.56        12- 15 ml/min   Jul- dec 2024              3.68- 4.20        10- 11 ml/min March 2025                 5.24- 5.77        7           09/06/23                        5.50                  09/22/23                        5.90                 6  UA 5/10 - prot > 300, 0-5 wbc, 11-20 rbcs, no bact, 0-5 epis  UNa 116, UCr 36  CXR 5/10 Chest: showed increased interstitial lung markings with multifocal areas of airspace disease bilaterally   IV lasix  40mg  given early am, later at 10:54am pt got IV lasix  100mg      Assessment/ Plan: AKI on CKD 5: b/l creat 4.2- 5.2 from late 2024 and march 2025. Pt is f/b Dr  Christianne Cowper in clinic and pt does not want dialysis per the records at the office. Creat here is 5.90 in the setting of SOB and fluid overload. CXR showed bilat vasc congestion and bilat infiltrates c/w pulm edema. Pt was on torsemide  40 bid at home. She was started on IV lasix  here at 100mg  q 12 hrs yesterday, pt was not grossly uremic. UOP was 850 cc overnight, and creat down to 5.80 today. Will need stronger diuretics, plan increase IV lasix  to 120 tid. Will follow.  HTN: bp on admit was 194/87, stable 160/80 now. Getting home bp meds x 3. Get vol down w/ diuretics.  HFrEF: recent EF 35-40% in march NSTEMI: trops 100-200 range, per pmd      Larry Poag MD  [facility] 09/23/2023, 12:27 PM  Recent Labs  Lab 09/22/23 0036 09/23/23 0437  HGB 9.8* 8.2*  ALBUMIN   --  2.4*  CALCIUM  9.3 8.5*  PHOS  --  6.3*  CREATININE 5.90* 5.80*  K 4.5 4.3   Recent Labs  Lab 09/22/23 1144  IRON 19*  TIBC 192*  FERRITIN 259   Inpatient medications:  amLODipine   10 mg Oral QHS   budeson-glycopyrrolate-formoterol   2 puff Inhalation BID   carvedilol   12.5 mg Oral BID WC   Chlorhexidine  Gluconate Cloth  6 each Topical Q0600   citalopram   10 mg Oral Daily   cyanocobalamin   1,000 mcg Oral Daily   heparin   5,000 Units Subcutaneous Q8H   hydrALAZINE   100 mg Oral Q8H   lidocaine   1 patch Transdermal Q24H   sodium bicarbonate   1,300 mg Oral BID    furosemide  100 mg (09/23/23 0853)   acetaminophen  **OR** acetaminophen , albuterol , Muscle Rub

## 2023-09-23 NOTE — Progress Notes (Signed)
*  PRELIMINARY RESULTS* Echocardiogram 2D Echocardiogram has been performed.  Bernis Brisker 09/23/2023, 5:57 PM

## 2023-09-23 NOTE — Plan of Care (Signed)

## 2023-09-23 NOTE — Evaluation (Signed)
 Physical Therapy Evaluation Patient Details Name: [name] MRN: [id number] DOB: December 27, 1933 Today's Date: 09/23/2023  History of Present Illness  [name] is a 89 y.o. female admitted 09/21/23 for CHF exacerbation. Pt presented with worsening SOB and increased LE swelling. Chest x-ray showing chronic appearing increased interstitial lung markings with multifocal areas of airspace disease bilaterally along with small bilateral pleural effusions. PMH significant for moderate persistent asthma, chronic combined systolic and diastolic CHF, CKD stage V, anemia of CKD, hypertension, hyperlipidemia, and GERD.   Clinical Impression  Pt admitted with above diagnosis. PTA, pt was modI for functional mobility using a SPC and independent with ADLs. She lives with her granddaughter in a ground floor apartment with a level entry. Pt currently with functional limitations due to the deficits listed below (see PT Problem List). She required min-modA for supine>sit and modA for transfers using RW. Unable to ambulate this date d/t fatigue, weakness, and R knee pain. Pt will benefit from acute skilled PT to increase her independence and safety with mobility to allow discharge. Recommend post-acute rehab <3 hours/day of therapy to increase strength, improve cardiopulmonary endurance, decrease fall risk, reduce risk of rehospitalization, and maximize rehab potential prior to return home.     If plan is discharge home, recommend the following: A lot of help with walking and/or transfers;A lot of help with bathing/dressing/bathroom;Assistance with cooking/housework;Assist for transportation   Can travel by private vehicle   No    Equipment Recommendations Wheelchair (measurements PT);Wheelchair cushion (measurements PT);BSC/3in1;Rolling walker (2 wheels)  Recommendations for Other Services       Functional Status Assessment Patient has had a recent decline in their functional status and demonstrates  the ability to make significant improvements in function in a reasonable and predictable amount of time.     Precautions / Restrictions Precautions Precautions: Fall Recall of Precautions/Restrictions: Intact Restrictions Weight Bearing Restrictions Per Provider Order: No      Mobility  Bed Mobility Overal bed mobility: Needs Assistance Bed Mobility: Supine to Sit     Supine to sit: Min assist, Mod assist, HOB elevated, Used rails     General bed mobility comments: Pt sat up on L side of bed with increased time. Assist to bring BLE off EOB and to scoot fwd with use of bed pad. Pt elevated her trunk with use of bedrail.    Transfers Overall transfer level: Needs assistance Equipment used: Rolling walker (2 wheels) Transfers: Sit to/from Stand, Bed to chair/wheelchair/BSC Sit to Stand: Mod assist, From elevated surface Stand pivot transfers: Mod assist         General transfer comment: Pt stood from lowest bed height with modA to power up and VC for proper hand placement on RW. She c/o worsening R knee pain and was unable to step forward or laterally. Moved recliner chair from opposite side of bed to diagonal next to pt on her R. Raised bed height to give pt an advantage with standing. VC for use of momentum and to have B knee flex underneath her. ModA to power and pivot to recliner chair. Poor eccentric control with sitting.    Ambulation/Gait               General Gait Details: Deferred d/t fatigue, weakness, and R knee pain.  Stairs            Wheelchair Mobility     Tilt Bed    Modified Rankin (Stroke Patients Only)       Balance  Overall balance assessment: Needs assistance Sitting-balance support: Bilateral upper extremity supported, Feet supported Sitting balance-Leahy Scale: Fair Sitting balance - Comments: Pt required min-modA to aid in scooting EOB and in recliner chair.   Standing balance support: Bilateral upper extremity supported, During  functional activity, Reliant on assistive device for balance Standing balance-Leahy Scale: Poor Standing balance comment: Pt dependent on RW and required modA to transfer.                             Pertinent Vitals/Pain Pain Assessment Pain Assessment: Faces Faces Pain Scale: Hurts even more Pain Location: Head and R knee Pain Descriptors / Indicators: Headache, Grimacing, Guarding, Tender, Discomfort Pain Intervention(s): Monitored during session, Repositioned    Home Living Family/patient expects to be discharged to:: Private residence Living Arrangements: Other relatives (granddaughter) Available Help at Discharge: Family;Available PRN/intermittently Type of Home: Apartment Home Access: Level entry       Home Layout: One level Home Equipment: Cane - single point;Shower seat      Prior Function Prior Level of Function : Independent/Modified Independent;Driving             Mobility Comments: ModI for gait using the SPC. Denies falls in the last 43mo. Reports she doesn't interact with stairs. ADLs Comments: Indep with ADLs. Pt can drive, but doesn't as much anymore and will not drive at night. Typically relies on her family for transportation. Pt reports she can make small meals for herself. Granddaughter manages medications.     Extremity/Trunk Assessment   Upper Extremity Assessment Upper Extremity Assessment: Defer to OT evaluation    Lower Extremity Assessment Lower Extremity Assessment: Generalized weakness;RLE deficits/detail RLE: Unable to fully assess due to pain RLE Sensation: decreased light touch;decreased proprioception RLE Coordination: decreased gross motor    Cervical / Trunk Assessment Cervical / Trunk Assessment: Kyphotic  Communication   Communication Communication: Impaired Factors Affecting Communication: Reduced clarity of speech;Difficulty expressing self (very soft spoken)    Cognition Arousal: Alert Behavior During  Therapy: Flat affect   PT - Cognitive impairments: No family/caregiver present to determine baseline, Sequencing, Initiation                       PT - Cognition Comments: Pt A,Ox4. She appeared to have delayed processing. Pt was unable to rate her pain on a scale of 0-10 and took increase time to follow sensation testing. Following commands: Intact       Cueing Cueing Techniques: Verbal cues     General Comments General comments (skin integrity, edema, etc.): Pt greeted on 1L O2, titrated to RA with SpO2 >90% throughout session.    Exercises     Assessment/Plan    PT Assessment Patient needs continued PT services  PT Problem List Decreased strength;Decreased activity tolerance;Decreased balance;Decreased mobility;Pain;Decreased coordination;Decreased knowledge of use of DME       PT Treatment Interventions DME instruction;Gait training;Functional mobility training;Therapeutic activities;Therapeutic exercise;Balance training;Patient/family education    PT Goals (Current goals can be found in the Care Plan section)  Acute Rehab PT Goals Patient Stated Goal: Have less knee pain and get stronger to move easier PT Goal Formulation: With patient Time For Goal Achievement: 10/07/23 Potential to Achieve Goals: Good    Frequency Min 2X/week     Co-evaluation               AM-PAC PT "6 Clicks" Mobility  Outcome Measure Help needed turning from  your back to your side while in a flat bed without using bedrails?: A Lot Help needed moving from lying on your back to sitting on the side of a flat bed without using bedrails?: A Lot Help needed moving to and from a bed to a chair (including a wheelchair)?: A Lot Help needed standing up from a chair using your arms (e.g., wheelchair or bedside chair)?: A Lot Help needed to walk in hospital room?: Total Help needed climbing 3-5 steps with a railing? : Total 6 Click Score: 10    End of Session Equipment Utilized During  Treatment: Gait belt Activity Tolerance: Patient limited by fatigue;Patient limited by pain Patient left: in chair;with call bell/phone within reach;with chair alarm set Nurse Communication: Mobility status PT Visit Diagnosis: Muscle weakness (generalized) (M62.81);Difficulty in walking, not elsewhere classified (R26.2);Unsteadiness on feet (R26.81);Other abnormalities of gait and mobility (R26.89);Pain Pain - Right/Left: Right Pain - part of body: Knee    Time: 1610-9604 PT Time Calculation (min) (ACUTE ONLY): 38 min   Charges:   PT Evaluation $PT Eval Moderate Complexity: 1 Mod PT Treatments $Therapeutic Activity: 8-22 mins PT General Charges $$ ACUTE PT VISIT: 1 Visit         Glenford Lanes, PT, DPT Acute Rehabilitation Services Office: [phone] Secure Chat Preferred  Riva Chester 09/23/2023, 11:29 AM

## 2023-09-23 NOTE — Progress Notes (Signed)
 PROGRESS NOTE    [name]  ZOX:[id number] DOB: Mar 18, 1934 DOA: 09/21/2023 PCP: Reginal Capra, MD   Brief Narrative:  HPI: [name] is a 89 y.o. female with medical history significant of moderate persistent asthma, chronic combined systolic and diastolic CHF, CKD stage V, anemia of CKD, hypertension, hyperlipidemia, GERD who presented to the ED with shortness of breath.   Patient states that she has been having worsening shortness of breath over the past 2 to 3 days.  She feels that she has been wheezing as well.  She thought that her symptoms were coming from her underlying asthma as she has required hospitalization for it in the past.  She used her home inhalers without much improvement in her symptoms.  She has also noted increasing leg swelling.  Given that her shortness of breath was not improving, this prompted her to come to the ED for further evaluation.  She denies any fevers, chills, nausea, vomiting, chest pain, palpitations, worsening cough, abdominal pain, urinary changes.   [facility]: Vital signs notable for tachypnea, hypertension to the 200s/80s, new oxygen requirement of 2 L [place].  CBC showing hemoglobin 9.8 (around her baseline).  BMP showing bicarbonate 20, glucose 121, creatinine 5.9 (baseline around 5.5-5.7), BUN 65, anion gap 17.  Troponin 165.  proBNP greater than 35,000.  Chest x-ray showing chronic appearing increased interstitial lung markings with multifocal areas of airspace disease bilaterally along with small bilateral pleural effusions (CT chest recommended for further evaluation). Patient given albuterol  nebs, IV Lasix  40 mg, DuoNebs, IV hydralazine  and IV labetalol  in the ED.  Patient transferred to [facility] for further evaluation and care.  Assessment & Plan:   Principal Problem:   Acute exacerbation of CHF (congestive heart failure) (HCC)  Acute hypoxic respiratory failure secondary to acute on chronic combined systolic and diastolic HF  /Pulmonary hypertension, POA: Presented hypoxic, 87% on room air, required 2 L of oxygen, proBNP > 35,000 and chest x-ray with vascular congestion and crackles on the clinical examination.  Received a dose of IV Lasix  40 in the ED, nephrology consulted and per their recommendations, Foley catheter was placed and patient was started on Lasix  100 mg IV twice daily.  Patient improving.  Appreciate nephrology help.   CKD Stage V/ Anion gap metabolic acidosis, POA: Patient with baseline creatinine around 5.5-5.7.  On admission, creatinine 5.9 with GFR 6.  She does also have an anion gap metabolic acidosis stemming from her kidney dysfunction.  Nephrology has been consulted given severe kidney dysfunction and need for diuresis.  She is on bicarb tablets.  Avoid nephrotoxic agents.  Monitor labs closely.  Appreciate nephrology managing this.  Hypertensive urgency Blood pressure as high as systolic 203 and diastolic 91 so far.  Her home medications resumed.  She is on carvedilol  12.5 mg twice daily, hydralazine  100 mg 3 times daily, amlodipine  10 mg daily.  Blood pressure improving.   NSTEMI ruled out/demand ischemia  EKG showing sinus rhythm.  Troponin elevated 186> 203>200.  Her troponins event previously was elevated until 154 ,9 months ago.  In the setting of no chest pain and hypertensive urgency, suspect elevated troponin is secondary to demand ischemia.  Monitor on telemetry.   Moderate persistent asthma No wheezing on examination.  Not in exacerbation.  Continue albuterol  nebs every 6 hours as needed, breztri  twice daily   Anemia of CKD Baseline hemoglobin around 9-10.  On admission, hemoglobin 9.8 which is down to 8.2 today.  No signs  of bleeding.  Will defer to nephrology for ESA.  Will transfuse if it drops to less than 7.   GERD - reported not taking protonix  since April   Hyperlipidemia - Not on any medications for this in the outpatient setting  DVT prophylaxis: heparin  injection 5,000  Units Start: 09/22/23 0930   Code Status: Full Code  Family Communication:  None present at bedside.  Plan of care discussed with patient in length and he/she verbalized understanding and agreed with it.  Status is: Inpatient Remains inpatient appropriate because: Needs IV diuresis   Estimated body mass index is 25.32 kg/m as calculated from the following:   Height as of an earlier encounter on 09/21/23: 5\' 2"  (1.575 m).   Weight as of this encounter: 62.8 kg.    Nutritional Assessment: Body mass index is 25.32 kg/m.Aaron Aas Seen by dietician.  I agree with the assessment and plan as outlined below: Nutrition Status:        . Skin Assessment: I have examined the patient's skin and I agree with the wound assessment as performed by the wound care RN as outlined below:    Consultants:  Nephrology  Procedures:  None  Antimicrobials:  Anti-infectives (From admission, onward)    None         Subjective: Patient seen and examined, she is sitting in the recliner, appears very comfortable but still complains of intermittent shortness of breath and says that she is not feeling any better than yesterday.  Objective: Vitals:   09/23/23 0003 09/23/23 0442 09/23/23 0620 09/23/23 0734  BP: (!) 147/55 (!) 167/59  (!) 145/60  Pulse: 68 69  68  Resp: 16 16  18   Temp: 99.3 F (37.4 C) 98 F (36.7 C)  97.6 F (36.4 C)  TempSrc: Oral Oral  Oral  SpO2: 98% 100%  94%  Weight:   62.8 kg     Intake/Output Summary (Last 24 hours) at 09/23/2023 0909 Last data filed at 09/23/2023 0500 Gross per 24 hour  Intake 240 ml  Output 850 ml  Net -610 ml   Filed Weights   09/22/23 1210 09/23/23 0620  Weight: 60.6 kg 62.8 kg    Examination:  General exam: Appears calm and comfortable  Respiratory system: Faint crackles bilaterally. Respiratory effort normal. Cardiovascular system: S1 & S2 heard, RRR. No JVD, murmurs, rubs, gallops or clicks. No pedal edema. Gastrointestinal system:  Abdomen is nondistended, soft and nontender. No organomegaly or masses felt. Normal bowel sounds heard. Central nervous system: Alert and oriented. No focal neurological deficits. Extremities: Symmetric 5 x 5 power. Skin: No rashes, lesions or ulcers  Data Reviewed: I have personally reviewed following labs and imaging studies  CBC: Recent Labs  Lab 09/22/23 0036 09/23/23 0437  WBC 7.2 4.8  HGB 9.8* 8.2*  HCT 32.0* 27.3*  MCV 97.3 97.5  PLT 198 162   Basic Metabolic Panel: Recent Labs  Lab 09/22/23 0036 09/23/23 0437  NA 142 140  K 4.5 4.3  CL 106 106  CO2 20* 20*  GLUCOSE 121* 113*  BUN 65* 68*  CREATININE 5.90* 5.80*  CALCIUM  9.3 8.5*  PHOS  --  6.3*   GFR: Estimated Creatinine Clearance: 6 mL/min (A) (by C-G formula based on SCr of 5.8 mg/dL (H)). Liver Function Tests: Recent Labs  Lab 09/23/23 0437  ALBUMIN  2.4*   No results for input(s): "LIPASE", "AMYLASE" in the last 168 hours. No results for input(s): "AMMONIA" in the last 168 hours. Coagulation Profile: No results  for input(s): "INR", "PROTIME" in the last 168 hours. Cardiac Enzymes: No results for input(s): "CKTOTAL", "CKMB", "CKMBINDEX", "TROPONINI" in the last 168 hours. BNP (last 3 results) Recent Labs    09/22/23 0036  PROBNP >35,000.0*   HbA1C: Recent Labs    09/22/23 0954  HGBA1C 5.1   CBG: Recent Labs  Lab 09/22/23 2125 09/23/23 0741  GLUCAP 110* 129*   Lipid Profile: No results for input(s): "CHOL", "HDL", "LDLCALC", "TRIG", "CHOLHDL", "LDLDIRECT" in the last 72 hours. Thyroid  Function Tests: No results for input(s): "TSH", "T4TOTAL", "FREET4", "T3FREE", "THYROIDAB" in the last 72 hours. Anemia Panel: Recent Labs    09/22/23 1144  FERRITIN 259  TIBC 192*  IRON 19*   Sepsis Labs: No results for input(s): "PROCALCITON", "LATICACIDVEN" in the last 168 hours.  No results found for this or any previous visit (from the past 240 hours).   Radiology Studies: CT CHEST WO  CONTRAST Result Date: 09/22/2023 CLINICAL DATA:  Followup abnormal chest x-ray. EXAM: CT CHEST WITHOUT CONTRAST TECHNIQUE: Multidetector CT imaging of the chest was performed following the standard protocol without IV contrast. RADIATION DOSE REDUCTION: This exam was performed according to the departmental dose-optimization program which includes automated exposure control, adjustment of the mA and/or kV according to patient size and/or use of iterative reconstruction technique. COMPARISON:  Chest x-ray 09/22/2023 and prior chest CT 08/08/2023 FINDINGS: Cardiovascular: The heart is mildly enlarged but stable. No pericardial effusion. Stable tortuosity and heavy calcification of the thoracic aorta. Stable three-vessel coronary artery calcifications. Mediastinum/Nodes: Scattered borderline mediastinal and hilar lymph nodes, unchanged. No new or progressive findings. The esophagus is grossly normal. Lungs/Pleura: Interval enlargement of bilateral pleural effusions which are moderate sized. Associated fluid in the right major fissure and some overlying atelectasis. Multifocal ground-glass opacities in both lungs suspicious for asymmetric pulmonary edema or atypical/viral pneumonia. More focal areas of dense airspace consolidation are noted in the right lower lobe and left upper lobe which could be secondary bacterial infiltrates. Rounded nodular densities are likely infiltrates. The tracheobronchial tree is grossly normal. No findings suspicious for aspiration. Underlying emphysematous changes and pulmonary scarring. Upper Abdomen: No significant upper abdominal findings. Stable left renal cyst not requiring any further imaging evaluation or follow-up. Stable vascular disease. Musculoskeletal: No breast masses, supraclavicular or axillary adenopathy. The bony thorax is intact. IMPRESSION: 1. Interval enlargement of bilateral pleural effusions which are moderate sized. 2. Multifocal ground-glass opacities in both lungs  suspicious for asymmetric pulmonary edema or atypical/viral pneumonia. 3. More focal areas of dense airspace consolidation in the right lower lobe and left upper lobe could be secondary bacterial infiltrates. 4. Underlying emphysematous changes and pulmonary scarring. 5. Stable borderline mediastinal and hilar lymph nodes, likely reactive. 6. Stable vascular disease. Aortic Atherosclerosis (ICD10-I70.0) and Emphysema (ICD10-J43.9). Electronically Signed   By: Marrian Siva M.D.   On: 09/22/2023 16:34   DG Chest Port 1 View Result Date: 09/22/2023 CLINICAL DATA:  Chest pain and shortness of breath. EXAM: PORTABLE CHEST 1 VIEW COMPARISON:  August 09, 2023 FINDINGS: The cardiac silhouette is enlarged and unchanged in size. There is marked severity calcification of the aortic arch. Chronic appearing increased interstitial lung markings are seen with multifocal areas of airspace disease seen bilaterally. There are small bilateral pleural effusions. No pneumothorax is identified. Multilevel degenerative changes are seen throughout the thoracic spine. IMPRESSION: 1. Chronic appearing increased interstitial lung markings with multifocal areas of airspace disease bilaterally. Correlation with chest CT is recommended, as multifocal infiltrates cannot be excluded. 2. Small  bilateral pleural effusions. Electronically Signed   By: Virgle Grime M.D.   On: 09/22/2023 03:13    Scheduled Meds:  amLODipine   10 mg Oral QHS   budeson-glycopyrrolate-formoterol   2 puff Inhalation BID   carvedilol   12.5 mg Oral BID WC   Chlorhexidine  Gluconate Cloth  6 each Topical Q0600   citalopram   10 mg Oral Daily   cyanocobalamin   1,000 mcg Oral Daily   heparin   5,000 Units Subcutaneous Q8H   hydrALAZINE   100 mg Oral Q8H   lidocaine   1 patch Transdermal Q24H   sodium bicarbonate   1,300 mg Oral BID   Continuous Infusions:  furosemide  100 mg (09/23/23 0853)     LOS: 1 day   Modena Andes, MD [facility]  09/23/2023,  9:09 AM   *Please note that this is a verbal dictation therefore any spelling or grammatical errors are due to the "[employer] One" system interpretation.  Please page via Amion and do not message via secure chat for urgent patient care matters. Secure chat can be used for non urgent patient care matters.  How to contact the [facility] Attending or Consulting provider 7A - 7P or covering provider during after hours 7P -7A, for this patient?  Check the care team in [facility] and look for a) attending/consulting [facility] provider listed and b) the [facility] team listed. Page or secure chat 7A-7P. Log into [email or web address] and use [place]'s universal password to access. If you do not have the password, please contact the hospital operator. Locate the [facility] provider you are looking for under [facility] and page to a number that you can be directly reached. If you still have difficulty reaching the provider, please page the [facility] (Director on Call) for the Hospitalists listed on amion for assistance.

## 2023-09-24 DIAGNOSIS — I5043 Acute on chronic combined systolic (congestive) and diastolic (congestive) heart failure: Secondary | ICD-10-CM | POA: Diagnosis not present

## 2023-09-24 LAB — GLUCOSE, CAPILLARY
Glucose-Capillary: 118 mg/dL — ABNORMAL HIGH (ref 70–99)
Glucose-Capillary: 123 mg/dL — ABNORMAL HIGH (ref 70–99)
Glucose-Capillary: 165 mg/dL — ABNORMAL HIGH (ref 70–99)

## 2023-09-24 LAB — CBC WITH DIFFERENTIAL/PLATELET
Abs Immature Granulocytes: 0.02 10*3/uL (ref 0.00–0.07)
Basophils Absolute: 0 10*3/uL (ref 0.0–0.1)
Basophils Relative: 0 %
Eosinophils Absolute: 0 10*3/uL (ref 0.0–0.5)
Eosinophils Relative: 0 %
HCT: 26 % — ABNORMAL LOW (ref 36.0–46.0)
Hemoglobin: 8.2 g/dL — ABNORMAL LOW (ref 12.0–15.0)
Immature Granulocytes: 0 %
Lymphocytes Relative: 19 %
Lymphs Abs: 0.9 10*3/uL (ref 0.7–4.0)
MCH: 30 pg (ref 26.0–34.0)
MCHC: 31.5 g/dL (ref 30.0–36.0)
MCV: 95.2 fL (ref 80.0–100.0)
Monocytes Absolute: 0.5 10*3/uL (ref 0.1–1.0)
Monocytes Relative: 10 %
Neutro Abs: 3.4 10*3/uL (ref 1.7–7.7)
Neutrophils Relative %: 71 %
Platelets: 165 10*3/uL (ref 150–400)
RBC: 2.73 MIL/uL — ABNORMAL LOW (ref 3.87–5.11)
RDW: 16.2 % — ABNORMAL HIGH (ref 11.5–15.5)
WBC: 4.9 10*3/uL (ref 4.0–10.5)
nRBC: 0 % (ref 0.0–0.2)

## 2023-09-24 LAB — BASIC METABOLIC PANEL WITH GFR
Anion gap: 14 (ref 5–15)
BUN: 68 mg/dL — ABNORMAL HIGH (ref 8–23)
CO2: 21 mmol/L — ABNORMAL LOW (ref 22–32)
Calcium: 8.1 mg/dL — ABNORMAL LOW (ref 8.9–10.3)
Chloride: 104 mmol/L (ref 98–111)
Creatinine, Ser: 6.03 mg/dL — ABNORMAL HIGH (ref 0.44–1.00)
GFR, Estimated: 6 mL/min — ABNORMAL LOW (ref >60)
Glucose, Bld: 106 mg/dL — ABNORMAL HIGH (ref 70–99)
Potassium: 4.2 mmol/L (ref 3.5–5.1)
Sodium: 139 mmol/L (ref 135–145)

## 2023-09-24 MED ORDER — FERROUS SULFATE 325 (65 FE) MG PO TABS
325.0000 mg | ORAL_TABLET | Freq: Two times a day (BID) | ORAL | Status: DC
  Administered 2023-09-24 – 2023-09-26 (×4): 325 mg via ORAL
  Filled 2023-09-24 (×4): qty 1

## 2023-09-24 MED ORDER — FERROUS SULFATE 325 (65 FE) MG PO TABS: 325.0000 mg | ORAL_TABLET | Freq: Three times a day (TID) | ORAL | Status: DC

## 2023-09-24 MED ORDER — HALOPERIDOL LACTATE 5 MG/ML IJ SOLN: 5.0000 mg | Freq: Four times a day (QID) | INTRAMUSCULAR | Status: DC | PRN

## 2023-09-24 MED ORDER — DARBEPOETIN ALFA 60 MCG/0.3ML IJ SOSY
60.0000 ug | PREFILLED_SYRINGE | INTRAMUSCULAR | Status: DC
  Administered 2023-09-24: 60 ug via SUBCUTANEOUS
  Filled 2023-09-24: qty 0.3

## 2023-09-24 MED ORDER — TORSEMIDE 20 MG PO TABS
40.0000 mg | ORAL_TABLET | Freq: Two times a day (BID) | ORAL | Status: DC
  Administered 2023-09-25 – 2023-09-26 (×3): 40 mg via ORAL
  Filled 2023-09-24 (×3): qty 2

## 2023-09-24 MED ORDER — SODIUM BICARBONATE 650 MG PO TABS
650.0000 mg | ORAL_TABLET | Freq: Two times a day (BID) | ORAL | Status: DC
  Administered 2023-09-24 – 2023-09-26 (×4): 650 mg via ORAL
  Filled 2023-09-24 (×4): qty 1

## 2023-09-24 NOTE — TOC Initial Note (Addendum)
 Transition of Care [facility]) - Initial/Assessment Note    Patient Details  Name: [name] MRN: [id number] Date of Birth: 05/26/1933  Transition of [facility]) CM/SW Contact:    Carmon Christen, LCSWA Phone Number: 09/24/2023, 10:47 AM  Clinical Narrative:                  CSW received consult for possible SNF placement at time of discharge. CSW spoke with patient and Granddaughter [name] and patients daughter at bedside regarding PT recommendation of SNF placement at time of discharge. PTA patient comes from home with Granddaughter. Due to patients current orientation CSW spoke with patients Granddaughter who expressed understanding of PT recommendation and politely declined SNF placement for patient at time of discharge. Patients plan is to return home when medically stable for dc. Patient would like to discuss home needs with CM. No further questions reported at this time. CSW to continue to follow and assist with discharge planning needs.    Expected Discharge Plan: Home/Self Care Barriers to Discharge: Continued Medical Work up   Patient Goals and CMS Choice Patient states their goals for this hospitalization and ongoing recovery are:: to return home plans to discuss home needs with CM   Choice offered to / list presented to : Patient      Expected Discharge Plan and Services In-house Referral: Clinical Social Work     Living arrangements for the past 2 months: Apartment                                      Prior Living Arrangements/Services Living arrangements for the past 2 months: Apartment Lives with:: Other (Comment) (granddaughter) Patient language and need for interpreter reviewed:: Yes Do you feel safe going back to the place where you live?: Yes      Need for Family Participation in Patient Care: Yes (Comment) Care giver support system in place?: Yes (comment)   Criminal Activity/Legal Involvement Pertinent to Current Situation/Hospitalization: No -  Comment as needed  Activities of Daily Living   ADL Screening (condition at time of admission) Independently performs ADLs?: No Does the patient have a NEW difficulty with bathing/dressing/toileting/self-feeding that is expected to last >3 days?: No Does the patient have a NEW difficulty with getting in/out of bed, walking, or climbing stairs that is expected to last >3 days?: Yes (Initiates electronic notice to provider for possible PT consult) Does the patient have a NEW difficulty with communication that is expected to last >3 days?: No Is the patient deaf or have difficulty hearing?: No Does the patient have difficulty seeing, even when wearing glasses/contacts?: No Does the patient have difficulty concentrating, remembering, or making decisions?: Yes  Permission Sought/Granted Permission sought to share information with : Case Manager, Family Supports, Oceanographer granted to share information with : Yes, Verbal Permission Granted  Share Information with NAME: [name]     Permission granted to share info w Relationship: Granddaughter  Permission granted to share info w Contact Information: [name] [phone]  Emotional Assessment Appearance:: Appears stated age Attitude/Demeanor/Rapport: Gracious Affect (typically observed): Calm Orientation: : Oriented to Self, Oriented to Place, Oriented to  Time, Oriented to Situation Alcohol / Substance Use: Not Applicable Psych Involvement: No (comment)  Admission diagnosis:  Acute exacerbation of CHF (congestive heart failure) (HCC) [I50.9] Acute on chronic congestive heart failure, unspecified heart failure type [facility]) [I50.9] Patient Active Problem List  Diagnosis Date Noted   Acute exacerbation of CHF (congestive heart failure) (HCC) 09/22/2023   Acute kidney injury superimposed on stage 5 chronic kidney disease, not on chronic dialysis (HCC) 08/08/2023   Hypertensive nephrosclerosis 08/08/2023    Anemia of chronic kidney failure, stage 5 (HCC) 08/08/2023   Metabolic acidosis 08/08/2023   Acute exacerbation of moderate persistent extrinsic asthma 08/08/2023   Acute on chronic heart failure with preserved ejection fraction (HFpEF) (HCC) 08/08/2023   AKI (acute kidney injury) (HCC) 08/08/2023   CAP (community acquired pneumonia) 10/31/2022   Hypertensive urgency 10/31/2022   Moderate persistent asthma 10/31/2022   Hypomagnesemia 10/31/2022   CKD (chronic kidney disease), stage IV (HCC) 10/31/2022   Pulmonary hypertension, unspecified (HCC) 12/06/2021   Chronic cough 10/18/2021   Chronic rhinitis 11/24/2019   Nausea 11/03/2018   Elevated troponin 11/03/2018   Hyponatremia 11/03/2018   Hypertensive heart disease with hypertensive chronic kidney disease 11/30/2017   Fasting hyperglycemia 11/30/2017   Renal cyst 03/27/2016   CKD (chronic kidney disease) stage 3, GFR 30-59 ml/min (HCC) 08/23/2015   Subjective hearing change 06/29/2014   Resistant hypertension 06/29/2014   Lumbago 06/15/2014   Pre-diabetes vs early DM  06/15/2014   Dyspnea 04/27/2014   Asthma, chronic 04/13/2014   Elevated uric acid in blood 04/13/2014   Essential hypertension 03/27/2014   History of DVT (deep vein thrombosis)    Palpitations 01/05/2014   Anemia of chronic disease 01/07/14   Death of family member 09-11-13   Leg edema 06/20/2013   Bereavement due to life event 04/21/2013   Other dysphagia 02/11/2013   Colon cancer screening 02/11/2013   Anxiety state 01/20/2013   Leg cramps 01/20/2013   Back pain 12/05/2012   Family hx of colon cancer 10/21/2012   Family hx of lung cancer 10/21/2012   Family hx-breast malignancy 10/21/2012   Renal insufficiency creatinine 1.3 10/21/2012   Anemia, chronic disease 10/21/2012   GERD (gastroesophageal reflux disease) 09/08/2012   HTN (hypertension) 09/08/2012   Hyperlipidemia 09/08/2012   Varicose veins of leg with complications 12/05/2010   PCP:   Reginal Capra, MD Pharmacy:   [facility] [place], [place] - [address] [facility] [address] [place] [address] Phone: [phone] Fax: [phone]  [facility] [address] [place] [address] Phone: [phone] Fax: [phone]     Social Drivers of Health (SDOH) Social History: SDOH Screenings   Food Insecurity: No Food Insecurity (09/23/2023)  Recent Concern: Food Insecurity - Food Insecurity Present (08/08/2023)  Housing: Low Risk  (09/23/2023)  Transportation Needs: No Transportation Needs (09/23/2023)  Utilities: Not At Risk (09/23/2023)  Alcohol Screen: Low Risk  (09/21/2023)  Depression (PHQ2-9): Low Risk  (09/21/2023)  Financial Resource Strain: Low Risk  (09/21/2023)  Physical Activity: Sufficiently Active (09/21/2023)  Social Connections: Moderately Integrated (09/23/2023)  Recent Concern: Social Connections - Moderately Isolated (08/08/2023)  Stress: No Stress Concern Present (09/21/2023)  Tobacco Use: Low Risk  (09/23/2023)  Health Literacy: Adequate Health Literacy (09/21/2023)   SDOH Interventions:     Readmission Risk Interventions    08/08/2023    3:21 PM 11/02/2022   12:32 PM  Readmission Risk Prevention Plan  Transportation Screening  Complete  Medication Review (RN Care Manager) Complete Complete  PCP or Specialist appointment within 3-5 days of discharge Complete Complete  HRI or Home Care Consult Complete Complete  SW Recovery Care/Counseling Consult  Complete  Palliative Care Screening  Not Applicable  Skilled Nursing Facility Complete Not Applicable

## 2023-09-24 NOTE — Progress Notes (Addendum)
 [facility] NEPHROLOGY PROGRESS NOTE  Assessment/ Plan: Pt is a 89 y.o. yo female with past medical history significant for CHF, HTN, HLD, acid reflux, CKD 5 presented with shortness of breath.  # AKI on CKD 5: b/l creat 4.2- 5.2 from late 2024 and march 2025. Pt is f/b Dr Christianne Cowper in clinic and pt does not want dialysis per the records at the office.  She is admitted with fluid overload and she was treated with IV diuretics with good response.  She is on room air and clinically feels much better. I spoke with her daughter and granddaughter today, they reported that patient was not really taking her diuretics as prescribed.  She is supposed to be on torsemide  40 twice a day but missing doses often. She is clear about not wanting dialysis with CHF confirmed again today.  Fortunately, she has no urgent indication to start dialysis at this time.  She will eventually need to follow-up with palliative care team.  I will switch IV diuretics to torsemide  40 mg twice a day.  Continue to follow outpatient.  # HTN/vol; resumed home antihypertensives and diuretics.  Monitor BP.  #HFrEF: recent EF 35-40%, volume management as above.  # Anemia due to iron deficiency and CKD: start iron and order dose of erythropoietin.  # Metabolic acidosis: Continue sodium bicarbonate .  Plan as outlined above.  Nothing further to add.  Sign off, please call us  back with question. Discussed with patient's multiple family members were present at the bedside today.  Subjective: Seen and examined.  Patient reported her breathing is much better.  Denies nausea, vomiting, dysgeusia, chest pain.  Urine output 1.7 L.  No major event. Objective Vital signs in last 24 hours: Vitals:   09/24/23 0627 09/24/23 0744 09/24/23 0808 09/24/23 1209  BP:  (!) 156/51  (!) 161/54  Pulse: 72 68    Resp:  17  20  Temp:  98.3 F (36.8 C)  97.9 F (36.6 C)  TempSrc:  Oral  Oral  SpO2: 95% 97% 95%   Weight:      Height:        Weight change: 2.2 kg  Intake/Output Summary (Last 24 hours) at 09/24/2023 1249 Last data filed at 09/24/2023 0831 Gross per 24 hour  Intake 696.32 ml  Output 1750 ml  Net -1053.68 ml       Labs: RENAL PANEL Recent Labs  Lab 09/22/23 0036 09/23/23 0437 09/24/23 0326  NA 142 140 139  K 4.5 4.3 4.2  CL 106 106 104  CO2 20* 20* 21*  GLUCOSE 121* 113* 106*  BUN 65* 68* 68*  CREATININE 5.90* 5.80* 6.03*  CALCIUM  9.3 8.5* 8.1*  PHOS  --  6.3*  --   ALBUMIN   --  2.4*  --     Liver Function Tests: Recent Labs  Lab 09/23/23 0437  ALBUMIN  2.4*   No results for input(s): "LIPASE", "AMYLASE" in the last 168 hours. No results for input(s): "AMMONIA" in the last 168 hours. CBC: Recent Labs    11/03/22 0544 11/04/22 0549 03/27/23 1243 03/27/23 1244 06/19/23 1327 06/27/23 1458 07/17/23 1336 08/08/23 0005 09/06/23 0000 09/11/23 1331 09/22/23 0036 09/22/23 1144 09/23/23 0437 09/24/23 0326  HGB  --    < >  --    < > 8.5*  --  11.1*   < > 8.4* 7.9* 9.8*  --  8.2* 8.2*  MCV  --    < >  --    < >  95.2  --  94.4   < >  --  92.3 97.3  --  97.5 95.2  VITAMINB12 1,306*  --   --   --   --  >2,000*  --   --   --   --   --   --   --   --   FOLATE 5.1*  --   --   --   --   --   --   --   --   --   --   --   --   --   FERRITIN 248   < > 212  --  306  --  351*  337*  --   --  446*  --  259  --   --   TIBC 139*  --   --   --   --   --  165*  --   --   --   --  192*  --   --   IRON 47  --   --   --   --   --  47  --   --   --   --  19*  --   --   RETICCTPCT 1.4  --   --   --   --   --   --   --   --   --   --   --   --   --    < > = values in this interval not displayed.    Cardiac Enzymes: No results for input(s): "CKTOTAL", "CKMB", "CKMBINDEX", "TROPONINI" in the last 168 hours. CBG: Recent Labs  Lab 09/23/23 0741 09/23/23 1216 09/23/23 2113 09/24/23 0800 09/24/23 1211  GLUCAP 129* 147* 128* 118* 123*    Iron Studies:  Recent Labs    09/22/23 1144  IRON 19*   TIBC 192*  FERRITIN 259   Studies/Results: ECHOCARDIOGRAM COMPLETE Result Date: 09/23/2023    ECHOCARDIOGRAM REPORT   Patient Name:   [name] Date of Exam: 09/23/2023 Medical Rec #:  [id number]          Height:       62.0 in Accession #:    [id number]         Weight:       138.4 lb Date of Birth:  1933-12-20           BSA:          1.635 m Patient Age:    89 years           BP:           175/63 mmHg Patient Gender: F                  HR:           68 bpm. Exam Location:  Inpatient Procedure: 2D Echo, Cardiac Doppler and Color Doppler (Both Spectral and Color            Flow Doppler were utilized during procedure). Indications:    CHF-Acute Systolic I50.21  History:        Patient has prior history of Echocardiogram examinations, most                 recent 08/08/2023. CHF; Risk Factors:Hypertension, Diabetes and                 Dyslipidemia. Hx of CKD.  Sonographer:    Denese Finn RCS Referring Phys:  [id number] SAGAR H JINWALA IMPRESSIONS  1. Left ventricular ejection fraction, by estimation, is 45 to 50%. The left ventricle has mildly decreased function. The left ventricle demonstrates global hypokinesis. There is severe left ventricular hypertrophy. Left ventricular diastolic parameters  are consistent with Grade III diastolic dysfunction (restrictive). Elevated left atrial pressure.  2. Right ventricular systolic function is normal. The right ventricular size is normal. There is severely elevated pulmonary artery systolic pressure. The estimated right ventricular systolic pressure is 86.6 mmHg.  3. Left atrial size was severely dilated.  4. Right atrial size was severely dilated.  5. The mitral valve is abnormal. Thickened leaflets. Moderate mitral valve regurgitation.  6. Tricuspid valve regurgitation is mild to moderate.  7. The aortic valve is tricuspid. Aortic valve regurgitation is trivial. Aortic valve sclerosis is present, with no evidence of aortic valve stenosis.  8. The inferior vena cava  is dilated in size with <50% respiratory variability, suggesting right atrial pressure of 15 mmHg. FINDINGS  Left Ventricle: Left ventricular ejection fraction, by estimation, is 45 to 50%. The left ventricle has mildly decreased function. The left ventricle demonstrates global hypokinesis. The left ventricular internal cavity size was normal in size. There is  severe left ventricular hypertrophy. Left ventricular diastolic parameters are consistent with Grade III diastolic dysfunction (restrictive). Elevated left atrial pressure. Right Ventricle: The right ventricular size is normal. No increase in right ventricular wall thickness. Right ventricular systolic function is normal. There is severely elevated pulmonary artery systolic pressure. The tricuspid regurgitant velocity is 4.23 m/s, and with an assumed right atrial pressure of 15 mmHg, the estimated right ventricular systolic pressure is 86.6 mmHg. Left Atrium: Left atrial size was severely dilated. Right Atrium: Right atrial size was severely dilated. Pericardium: Trivial pericardial effusion is present. Mitral Valve: The mitral valve is abnormal. Moderate mitral valve regurgitation. Tricuspid Valve: The tricuspid valve is normal in structure. Tricuspid valve regurgitation is mild to moderate. Aortic Valve: The aortic valve is tricuspid. Aortic valve regurgitation is trivial. Aortic valve sclerosis is present, with no evidence of aortic valve stenosis. Pulmonic Valve: The pulmonic valve was grossly normal. Pulmonic valve regurgitation is trivial. Aorta: The aortic root is normal in size and structure. Venous: The inferior vena cava is dilated in size with less than 50% respiratory variability, suggesting right atrial pressure of 15 mmHg. IAS/Shunts: The interatrial septum was not well visualized.  LEFT VENTRICLE PLAX 2D LVIDd:         4.90 cm   Diastology LVIDs:         3.50 cm   LV e' medial:    4.16 cm/s LV PW:         1.60 cm   LV E/e' medial:  34.3 LV IVS:         1.50 cm   LV e' lateral:   4.99 cm/s LVOT diam:     2.00 cm   LV E/e' lateral: 28.7 LV SV:         74 LV SV Index:   46 LVOT Area:     3.14 cm  RIGHT VENTRICLE RV S prime:     11.50 cm/s TAPSE (M-mode): 2.4 cm LEFT ATRIUM             Index        RIGHT ATRIUM           Index LA diam:        4.50 cm 2.75 cm/m   RA Area:  24.70 cm LA Vol (A2C):   89.5 ml 54.74 ml/m  RA Volume:   87.80 ml  53.70 ml/m LA Vol (A4C):   90.6 ml 55.41 ml/m LA Biplane Vol: 90.6 ml 55.41 ml/m  AORTIC VALVE LVOT Vmax:   102.00 cm/s LVOT Vmean:  68.200 cm/s LVOT VTI:    0.237 m  AORTA Ao Root diam: 3.10 cm MITRAL VALVE                  TRICUSPID VALVE MV Area (PHT): 7.66 cm       TR Peak grad:   71.6 mmHg MV Decel Time: 99 msec        TR Vmax:        423.00 cm/s MR Peak grad:    134.6 mmHg MR Mean grad:    92.0 mmHg    SHUNTS MR Vmax:         580.00 cm/s  Systemic VTI:  0.24 m MR Vmean:        454.0 cm/s   Systemic Diam: 2.00 cm MR PISA:         3.08 cm MR PISA Eff ROA: 12 mm MR PISA Radius:  0.70 cm MV E velocity: 143.00 cm/s MV A velocity: 69.80 cm/s MV E/A ratio:  2.05 Lopez Clara MD Electronically signed by Lopez Clara MD Signature Date/Time: 09/23/2023/7:01:41 PM    Final    US  RENAL Result Date: 09/23/2023 CLINICAL DATA:  Chronic kidney disease. EXAM: RENAL / URINARY TRACT ULTRASOUND COMPLETE COMPARISON:  None Available. FINDINGS: Right Kidney: Renal measurements: 8.3 x 3.7 x 4.6 cm = volume: 74.4 mL. Diffuse increased parenchymal echogenicity. Left kidney cyst measures 1.7 cm. This appears anechoic with increased through transmission. No hydronephrosis. Left Kidney: Renal measurements: 9.4 x 4.1 x 4.5 cm. = volume: 90.3 ml. Normal parenchymal echogenicity. Upper pole kidney cyst measures 3.3 cm. No hydronephrosis. Bladder: Decompressed. Other: None. IMPRESSION: 1. No acute findings. No hydronephrosis. 2. Increased right renal parenchymal echogenicity compatible with chronic medical renal disease.  3. Bilateral renal cysts. Electronically Signed   By: Kimberley Penman M.D.   On: 09/23/2023 09:36    Medications: Infusions:  furosemide  120 mg (09/24/23 0836)    Scheduled Medications:  amLODipine   10 mg Oral QHS   budeson-glycopyrrolate-formoterol   2 puff Inhalation BID   carvedilol   12.5 mg Oral BID WC   Chlorhexidine  Gluconate Cloth  6 each Topical Q0600   citalopram   10 mg Oral Daily   cyanocobalamin   1,000 mcg Oral Daily   heparin   5,000 Units Subcutaneous Q8H   hydrALAZINE   100 mg Oral Q8H   lidocaine   1 patch Transdermal Q24H   sodium bicarbonate   1,300 mg Oral BID    have reviewed scheduled and prn medications.  Physical Exam: General:NAD, comfortable Heart:RRR, s1s2 nl Lungs:clear b/l, no crackle Abdomen:soft, Non-tender, non-distended Extremities: Trace peripheral edema present Neurology: Alert, awake and following commands  [name] 09/24/2023,12:49 PM  LOS: 2 days

## 2023-09-24 NOTE — Progress Notes (Signed)
 This morning around 0655 am pt removed telemetry box and leads. She is confused, and this is new. Pt was awake most of the night. Day shift RN notified MD. No signs of distress, VS stable.

## 2023-09-24 NOTE — Evaluation (Signed)
 Occupational Therapy Evaluation Patient Details Name: [name] MRN: [id number] DOB: [birth year] Today's Date: 09/24/2023   History of Present Illness   [name] is a 89 y.o. female admitted 09/21/23 for CHF exacerbation. Pt presented with worsening SOB and increased LE swelling. Chest x-ray showing chronic appearing increased interstitial lung markings with multifocal areas of airspace disease bilaterally along with small bilateral pleural effusions. PMH significant for moderate persistent asthma, chronic combined systolic and diastolic CHF, CKD stage V, anemia of CKD, hypertension, hyperlipidemia, and GERD.     Clinical Impressions Pt presents with decline in function and safety with ADLs and ADL mobility with impaired strength, balance and endurance. PTA pt lives with grand daughter and was Ind with ADLs and light meal prep, can drive, but doesn't as much anymore and will not drive at night, relies on family for transportation, grand daughter manages medications, uses cane for mobility. Pt currently requires min A to sit EOB, mod A with LB ADLs, Sup with UB ADLs, mod A with toileting tasks and mod A with mobility/transfers using RW. Pt would benefit from acute OT services to address impairments to maximize level of function and safety     If plan is discharge home, recommend the following:   A lot of help with bathing/dressing/bathroom;A lot of help with walking and/or transfers;Assistance with cooking/housework;Direct supervision/assist for medications management;Assist for transportation;Help with stairs or ramp for entrance     Functional Status Assessment   Patient has had a recent decline in their functional status and demonstrates the ability to make significant improvements in function in a reasonable and predictable amount of time.     Equipment Recommendations   Tub/shower bench     Recommendations for Other Services          Precautions/Restrictions   Precautions Precautions: Fall Restrictions Weight Bearing Restrictions Per Provider Order: No     Mobility Bed Mobility Overal bed mobility: Needs Assistance Bed Mobility: Supine to Sit     Supine to sit: Min assist, HOB elevated, Used rails     General bed mobility comments: min A to elevate trunk, increased time, CGA ro scoot hips forward at EOB    Transfers Overall transfer level: Needs assistance Equipment used: Rolling walker (2 wheels) Transfers: Sit to/from Stand, Bed to chair/wheelchair/BSC Sit to Stand: Mod assist, From elevated surface Stand pivot transfers: Mod assist         General transfer comment: verbal and visual cues for correct hand placement      Balance Overall balance assessment: Needs assistance Sitting-balance support: Bilateral upper extremity supported, Feet supported Sitting balance-Leahy Scale: Fair     Standing balance support: Bilateral upper extremity supported, During functional activity, Reliant on assistive device for balance Standing balance-Leahy Scale: Poor Standing balance comment: verbal and visual cues for correct hand placement                           ADL either performed or assessed with clinical judgement   ADL Overall ADL's : Needs assistance/impaired Eating/Feeding: Independent   Grooming: Wash/dry hands;Wash/dry face;Contact guard assist;Standing   Upper Body Bathing: Supervision/ safety;Sitting   Lower Body Bathing: Moderate assistance   Upper Body Dressing : Supervision/safety;Sitting   Lower Body Dressing: Moderate assistance   Toilet Transfer: Moderate assistance;Ambulation;Rolling walker (2 wheels);Cueing for safety   Toileting- Clothing Manipulation and Hygiene: Moderate assistance;Sit to/from stand       Functional mobility during ADLs: Moderate assistance;Rolling walker (  2 wheels);Cueing for safety       Vision Ability to See in Adequate Light: 0  Adequate Patient Visual Report: No change from baseline       Perception         Praxis         Pertinent Vitals/Pain Pain Assessment Pain Assessment: Faces Pain Score: 8  Pain Location: Head and R knee Pain Descriptors / Indicators: Headache, Discomfort, Aching Pain Intervention(s): Monitored during session, Repositioned     Extremity/Trunk Assessment Upper Extremity Assessment Upper Extremity Assessment: Generalized weakness   Lower Extremity Assessment Lower Extremity Assessment: Defer to PT evaluation       Communication Communication Communication: Impaired Factors Affecting Communication: Reduced clarity of speech;Difficulty expressing self   Cognition Arousal: Alert Behavior During Therapy: WFL for tasks assessed/performed                                 Following commands: Intact       Cueing  General Comments   Cueing Techniques: Verbal cues      Exercises     Shoulder Instructions      Home Living Family/patient expects to be discharged to:: Private residence Living Arrangements: Other relatives Available Help at Discharge: Family;Available PRN/intermittently Type of Home: Apartment Home Access: Level entry     Home Layout: One level     Bathroom Shower/Tub: Chief Strategy Officer: Standard     Home Equipment: Cane - single point;Shower seat          Prior Functioning/Environment Prior Level of Function : Independent/Modified Independent;Driving             Mobility Comments: ModI  for mobility using the SPC. Denies falls in the last 52mo. Reports she doesn't interact with stairs. ADLs Comments: Ind with ADLs and light meal prep. Pt can drive, but doesn't as much anymore and will not drive at night. Typically relies on her family for transportation. Granddaughter manages medications    OT Problem List: Decreased strength;Impaired balance (sitting and/or standing);Pain;Decreased activity  tolerance;Decreased knowledge of use of DME or AE   OT Treatment/Interventions: Self-care/ADL training;Therapeutic exercise;Patient/family education;Therapeutic activities;DME and/or AE instruction      OT Goals(Current goals can be found in the care plan section)   Acute Rehab OT Goals Patient Stated Goal: go home OT Goal Formulation: With patient/family Time For Goal Achievement: 10/08/23 Potential to Achieve Goals: Good ADL Goals Pt Will Perform Grooming: with supervision;with set-up;standing Pt Will Perform Upper Body Bathing: with set-up;sitting Pt Will Perform Lower Body Bathing: with min assist Pt Will Perform Upper Body Dressing: with set-up;sitting Pt Will Perform Lower Body Dressing: with min assist Pt Will Transfer to Toilet: with min assist;with contact guard assist;ambulating Pt Will Perform Toileting - Clothing Manipulation and hygiene: with min assist;with contact guard assist;sitting/lateral leans;sit to/from stand   OT Frequency:  Min 2X/week    Co-evaluation              AM-PAC OT "6 Clicks" Daily Activity     Outcome Measure Help from another person eating meals?: None Help from another person taking care of personal grooming?: A Little Help from another person toileting, which includes using toliet, bedpan, or urinal?: A Lot Help from another person bathing (including washing, rinsing, drying)?: A Lot Help from another person to put on and taking off regular upper body clothing?: A Little Help from another person to put on  and taking off regular lower body clothing?: A Lot 6 Click Score: 16   End of Session Equipment Utilized During Treatment: Gait belt;Rolling walker (2 wheels) Nurse Communication: Mobility status  Activity Tolerance: Patient tolerated treatment well Patient left: in chair;with call bell/phone within reach;with family/visitor present;Other (comment) (MD in to see pt)  OT Visit Diagnosis: Unsteadiness on feet (R26.81);Other  abnormalities of gait and mobility (R26.89);Muscle weakness (generalized) (M62.81)                Time: 5284-1324 OT Time Calculation (min): 19 min Charges:  OT Evaluation $OT Eval Moderate Complexity: 1 Mod    Alfred Ann 09/24/2023, 1:27 PM

## 2023-09-24 NOTE — Progress Notes (Signed)
 PROGRESS NOTE    [name]  ONG:[id number] DOB: 1934-01-30 DOA: 09/21/2023 PCP: Reginal Capra, MD   Brief Narrative:  HPI: [name] is a 89 y.o. female with medical history significant of moderate persistent asthma, chronic combined systolic and diastolic CHF, CKD stage V, anemia of CKD, hypertension, hyperlipidemia, GERD who presented to the ED with shortness of breath.   Patient states that she has been having worsening shortness of breath over the past 2 to 3 days.  She feels that she has been wheezing as well.  She thought that her symptoms were coming from her underlying asthma as she has required hospitalization for it in the past.  She used her home inhalers without much improvement in her symptoms.  She has also noted increasing leg swelling.  Given that her shortness of breath was not improving, this prompted her to come to the ED for further evaluation.  She denies any fevers, chills, nausea, vomiting, chest pain, palpitations, worsening cough, abdominal pain, urinary changes.   [facility]: Vital signs notable for tachypnea, hypertension to the 200s/80s, new oxygen requirement of 2 L [place].  CBC showing hemoglobin 9.8 (around her baseline).  BMP showing bicarbonate 20, glucose 121, creatinine 5.9 (baseline around 5.5-5.7), BUN 65, anion gap 17.  Troponin 165.  proBNP greater than 35,000.  Chest x-ray showing chronic appearing increased interstitial lung markings with multifocal areas of airspace disease bilaterally along with small bilateral pleural effusions (CT chest recommended for further evaluation). Patient given albuterol  nebs, IV Lasix  40 mg, DuoNebs, IV hydralazine  and IV labetalol  in the ED.  Patient transferred to [facility] for further evaluation and care.  Assessment & Plan:   Principal Problem:   Acute exacerbation of CHF (congestive heart failure) (HCC)  Acute hypoxic respiratory failure secondary to acute on chronic combined systolic and diastolic HF  /Pulmonary hypertension, POA: Presented hypoxic, 87% on room air, required 2 L of oxygen, proBNP > 35,000 and chest x-ray with vascular congestion and crackles on the clinical examination.  Received a dose of IV Lasix  40 in the ED, nephrology consulted and per their recommendations, Foley catheter was placed and patient was started on Lasix  100 mg IV twice daily.  She only had 850 cc output in 24 hours so Lasix  was increased by nephrology to 120 mg twice daily and she has had 1750 output in the last 24 hours.  Patient's breathing is improving.  Appreciate nephrology help.   CKD Stage V/ Anion gap metabolic acidosis, POA: Patient with baseline creatinine around 5.5-5.7.  On admission, creatinine 5.9 with GFR 6.  She does also have an anion gap metabolic acidosis stemming from her kidney dysfunction.  Nephrology has been consulted given severe kidney dysfunction and need for diuresis.  She is on bicarb tablets.  Avoid nephrotoxic agents.  Monitor labs closely.  Appreciate nephrology managing this.  Hypertensive urgency Blood pressure as high as systolic 203 and diastolic 91 so far.  Her home medications resumed.  She is on carvedilol  12.5 mg twice daily, hydralazine  100 mg 3 times daily, amlodipine  10 mg daily.  Blood pressure improving.   NSTEMI ruled out/demand ischemia  EKG showing sinus rhythm.  Troponin elevated 186> 203>200.  Her troponins event previously was elevated until 154 ,9 months ago.  In the setting of no chest pain and hypertensive urgency, suspect elevated troponin is secondary to demand ischemia.  Monitor on telemetry.   Moderate persistent asthma No wheezing on examination.  Not in exacerbation.  Continue albuterol  nebs every 6 hours as needed, breztri  twice daily   Anemia of CKD Baseline hemoglobin around 9-10.  On admission, hemoglobin 9.8 which is down to 8.2 today.  No signs of bleeding.  Will defer to nephrology for ESA.  Will transfuse if it drops to less than 7.   GERD -  reported not taking protonix  since April   Hyperlipidemia - Not on any medications for this in the outpatient setting  Delirium?:  Per nurse, patient was confused earlier.  Her daughter and granddaughter showed up.  Then patient became slightly agitated and tried to pull lines and wanted to leave.  I saw this patient almost 15 to 20 minutes after receiving the page.  When I saw the patient, patient was fully alert and oriented.  She was calm and able to answer all questions.  No signs of confusion at all.  Per daughter, " patient was intentionally acting up because she wanted to go home" I had a lengthy discussion with the patient and her daughter at the bedside that the SNF is recommended by PT OT.  They are all in agreement.  TOC is consulted.  I do not think the delirium was real.  DVT prophylaxis: heparin  injection 5,000 Units Start: 09/22/23 0930   Code Status: Full Code  Family Communication:  None present at bedside.  Plan of care discussed with patient in length and he/she verbalized understanding and agreed with it.  Status is: Inpatient Remains inpatient appropriate because: Needs IV diuresis   Estimated body mass index is 25.32 kg/m as calculated from the following:   Height as of this encounter: 5\' 2"  (1.575 m).   Weight as of this encounter: 62.8 kg.    Nutritional Assessment: Body mass index is 25.32 kg/m.Aaron Aas Seen by dietician.  I agree with the assessment and plan as outlined below: Nutrition Status:        . Skin Assessment: I have examined the patient's skin and I agree with the wound assessment as performed by the wound care RN as outlined below:    Consultants:  Nephrology  Procedures:  None  Antimicrobials:  Anti-infectives (From admission, onward)    None         Subjective: Patient seen and examined.  She has no complaints.  Family members at bedside as mentioned above.  Patient says that her breathing is getting better  slowly. Objective: Vitals:   09/24/23 0100 09/24/23 0305 09/24/23 0627 09/24/23 0744  BP:  (!) 156/64  (!) 156/51  Pulse: 70 70 72 68  Resp:  16  17  Temp:  99.6 F (37.6 C)  98.3 F (36.8 C)  TempSrc:  Oral  Oral  SpO2: 95% 96% 95% 97%  Weight:      Height:        Intake/Output Summary (Last 24 hours) at 09/24/2023 0826 Last data filed at 09/24/2023 0655 Gross per 24 hour  Intake 471.32 ml  Output 1750 ml  Net -1278.68 ml   Filed Weights   09/22/23 1210 09/23/23 0620 09/23/23 1619  Weight: 60.6 kg 62.8 kg 62.8 kg    Examination:  General exam: Appears calm and comfortable  Respiratory system: Crackles at bases, more on the right lower lobe but improved compared to yesterday. Respiratory effort normal. Cardiovascular system: S1 & S2 heard, RRR. No JVD, murmurs, rubs, gallops or clicks. No pedal edema. Gastrointestinal system: Abdomen is nondistended, soft and nontender. No organomegaly or masses felt. Normal bowel sounds heard. Central nervous  system: Alert and oriented. No focal neurological deficits. Extremities: Symmetric 5 x 5 power. Skin: No rashes, lesions or ulcers.  Psychiatry: Judgement and insight appear normal. Mood & affect appropriate.   Data Reviewed: I have personally reviewed following labs and imaging studies  CBC: Recent Labs  Lab 09/22/23 0036 09/23/23 0437 09/24/23 0326  WBC 7.2 4.8 4.9  NEUTROABS  --   --  3.4  HGB 9.8* 8.2* 8.2*  HCT 32.0* 27.3* 26.0*  MCV 97.3 97.5 95.2  PLT 198 162 165   Basic Metabolic Panel: Recent Labs  Lab 09/22/23 0036 09/23/23 0437 09/24/23 0326  NA 142 140 139  K 4.5 4.3 4.2  CL 106 106 104  CO2 20* 20* 21*  GLUCOSE 121* 113* 106*  BUN 65* 68* 68*  CREATININE 5.90* 5.80* 6.03*  CALCIUM  9.3 8.5* 8.1*  PHOS  --  6.3*  --    GFR: Estimated Creatinine Clearance: 5.7 mL/min (A) (by C-G formula based on SCr of 6.03 mg/dL (H)). Liver Function Tests: Recent Labs  Lab 09/23/23 0437  ALBUMIN  2.4*   No  results for input(s): "LIPASE", "AMYLASE" in the last 168 hours. No results for input(s): "AMMONIA" in the last 168 hours. Coagulation Profile: No results for input(s): "INR", "PROTIME" in the last 168 hours. Cardiac Enzymes: No results for input(s): "CKTOTAL", "CKMB", "CKMBINDEX", "TROPONINI" in the last 168 hours. BNP (last 3 results) Recent Labs    09/22/23 0036  PROBNP >35,000.0*   HbA1C: Recent Labs    09/22/23 0954  HGBA1C 5.1   CBG: Recent Labs  Lab 09/22/23 2125 09/23/23 0741 09/23/23 1216 09/23/23 2113 09/24/23 0800  GLUCAP 110* 129* 147* 128* 118*   Lipid Profile: No results for input(s): "CHOL", "HDL", "LDLCALC", "TRIG", "CHOLHDL", "LDLDIRECT" in the last 72 hours. Thyroid  Function Tests: No results for input(s): "TSH", "T4TOTAL", "FREET4", "T3FREE", "THYROIDAB" in the last 72 hours. Anemia Panel: Recent Labs    09/22/23 1144  FERRITIN 259  TIBC 192*  IRON 19*   Sepsis Labs: No results for input(s): "PROCALCITON", "LATICACIDVEN" in the last 168 hours.  No results found for this or any previous visit (from the past 240 hours).   Radiology Studies: ECHOCARDIOGRAM COMPLETE Result Date: 09/23/2023    ECHOCARDIOGRAM REPORT   Patient Name:   XYA GENUNG [facility] Date of Exam: 09/23/2023 Medical Rec #:  [id number]          Height:       62.0 in Accession #:    [id number]         Weight:       138.4 lb Date of Birth:  [birth year]           BSA:          1.635 m Patient Age:    89 years           BP:           175/63 mmHg Patient Gender: F                  HR:           68 bpm. Exam Location:  Inpatient Procedure: 2D Echo, Cardiac Doppler and Color Doppler (Both Spectral and Color            Flow Doppler were utilized during procedure). Indications:    CHF-Acute Systolic I50.21  History:        Patient has prior history of Echocardiogram examinations, most  recent 08/08/2023. CHF; Risk Factors:Hypertension, Diabetes and                 Dyslipidemia. Hx of CKD.   Sonographer:    Denese Finn RCS Referring Phys: [id number] SAGAR H JINWALA IMPRESSIONS  1. Left ventricular ejection fraction, by estimation, is 45 to 50%. The left ventricle has mildly decreased function. The left ventricle demonstrates global hypokinesis. There is severe left ventricular hypertrophy. Left ventricular diastolic parameters  are consistent with Grade III diastolic dysfunction (restrictive). Elevated left atrial pressure.  2. Right ventricular systolic function is normal. The right ventricular size is normal. There is severely elevated pulmonary artery systolic pressure. The estimated right ventricular systolic pressure is 86.6 mmHg.  3. Left atrial size was severely dilated.  4. Right atrial size was severely dilated.  5. The mitral valve is abnormal. Thickened leaflets. Moderate mitral valve regurgitation.  6. Tricuspid valve regurgitation is mild to moderate.  7. The aortic valve is tricuspid. Aortic valve regurgitation is trivial. Aortic valve sclerosis is present, with no evidence of aortic valve stenosis.  8. The inferior vena cava is dilated in size with <50% respiratory variability, suggesting right atrial pressure of 15 mmHg. FINDINGS  Left Ventricle: Left ventricular ejection fraction, by estimation, is 45 to 50%. The left ventricle has mildly decreased function. The left ventricle demonstrates global hypokinesis. The left ventricular internal cavity size was normal in size. There is  severe left ventricular hypertrophy. Left ventricular diastolic parameters are consistent with Grade III diastolic dysfunction (restrictive). Elevated left atrial pressure. Right Ventricle: The right ventricular size is normal. No increase in right ventricular wall thickness. Right ventricular systolic function is normal. There is severely elevated pulmonary artery systolic pressure. The tricuspid regurgitant velocity is 4.23 m/s, and with an assumed right atrial pressure of 15 mmHg, the estimated right  ventricular systolic pressure is 86.6 mmHg. Left Atrium: Left atrial size was severely dilated. Right Atrium: Right atrial size was severely dilated. Pericardium: Trivial pericardial effusion is present. Mitral Valve: The mitral valve is abnormal. Moderate mitral valve regurgitation. Tricuspid Valve: The tricuspid valve is normal in structure. Tricuspid valve regurgitation is mild to moderate. Aortic Valve: The aortic valve is tricuspid. Aortic valve regurgitation is trivial. Aortic valve sclerosis is present, with no evidence of aortic valve stenosis. Pulmonic Valve: The pulmonic valve was grossly normal. Pulmonic valve regurgitation is trivial. Aorta: The aortic root is normal in size and structure. Venous: The inferior vena cava is dilated in size with less than 50% respiratory variability, suggesting right atrial pressure of 15 mmHg. IAS/Shunts: The interatrial septum was not well visualized.  LEFT VENTRICLE PLAX 2D LVIDd:         4.90 cm   Diastology LVIDs:         3.50 cm   LV e' medial:    4.16 cm/s LV PW:         1.60 cm   LV E/e' medial:  34.3 LV IVS:        1.50 cm   LV e' lateral:   4.99 cm/s LVOT diam:     2.00 cm   LV E/e' lateral: 28.7 LV SV:         74 LV SV Index:   46 LVOT Area:     3.14 cm  RIGHT VENTRICLE RV S prime:     11.50 cm/s TAPSE (M-mode): 2.4 cm LEFT ATRIUM             Index  RIGHT ATRIUM           Index LA diam:        4.50 cm 2.75 cm/m   RA Area:     24.70 cm LA Vol (A2C):   89.5 ml 54.74 ml/m  RA Volume:   87.80 ml  53.70 ml/m LA Vol (A4C):   90.6 ml 55.41 ml/m LA Biplane Vol: 90.6 ml 55.41 ml/m  AORTIC VALVE LVOT Vmax:   102.00 cm/s LVOT Vmean:  68.200 cm/s LVOT VTI:    0.237 m  AORTA Ao Root diam: 3.10 cm MITRAL VALVE                  TRICUSPID VALVE MV Area (PHT): 7.66 cm       TR Peak grad:   71.6 mmHg MV Decel Time: 99 msec        TR Vmax:        423.00 cm/s MR Peak grad:    134.6 mmHg MR Mean grad:    92.0 mmHg    SHUNTS MR Vmax:         580.00 cm/s  Systemic VTI:   0.24 m MR Vmean:        454.0 cm/s   Systemic Diam: 2.00 cm MR PISA:         3.08 cm MR PISA Eff ROA: 12 mm MR PISA Radius:  0.70 cm MV E velocity: 143.00 cm/s MV A velocity: 69.80 cm/s MV E/A ratio:  2.05 Carson Clara MD Electronically signed by Carson Clara MD Signature Date/Time: 09/23/2023/7:01:41 PM    Final    US  RENAL Result Date: 09/23/2023 CLINICAL DATA:  Chronic kidney disease. EXAM: RENAL / URINARY TRACT ULTRASOUND COMPLETE COMPARISON:  None Available. FINDINGS: Right Kidney: Renal measurements: 8.3 x 3.7 x 4.6 cm = volume: 74.4 mL. Diffuse increased parenchymal echogenicity. Left kidney cyst measures 1.7 cm. This appears anechoic with increased through transmission. No hydronephrosis. Left Kidney: Renal measurements: 9.4 x 4.1 x 4.5 cm. = volume: 90.3 ml. Normal parenchymal echogenicity. Upper pole kidney cyst measures 3.3 cm. No hydronephrosis. Bladder: Decompressed. Other: None. IMPRESSION: 1. No acute findings. No hydronephrosis. 2. Increased right renal parenchymal echogenicity compatible with chronic medical renal disease. 3. Bilateral renal cysts. Electronically Signed   By: Kimberley Penman M.D.   On: 09/23/2023 09:36   CT CHEST WO CONTRAST Result Date: 09/22/2023 CLINICAL DATA:  Followup abnormal chest x-ray. EXAM: CT CHEST WITHOUT CONTRAST TECHNIQUE: Multidetector CT imaging of the chest was performed following the standard protocol without IV contrast. RADIATION DOSE REDUCTION: This exam was performed according to the departmental dose-optimization program which includes automated exposure control, adjustment of the mA and/or kV according to patient size and/or use of iterative reconstruction technique. COMPARISON:  Chest x-ray 09/22/2023 and prior chest CT 08/08/2023 FINDINGS: Cardiovascular: The heart is mildly enlarged but stable. No pericardial effusion. Stable tortuosity and heavy calcification of the thoracic aorta. Stable three-vessel coronary artery calcifications.  Mediastinum/Nodes: Scattered borderline mediastinal and hilar lymph nodes, unchanged. No new or progressive findings. The esophagus is grossly normal. Lungs/Pleura: Interval enlargement of bilateral pleural effusions which are moderate sized. Associated fluid in the right major fissure and some overlying atelectasis. Multifocal ground-glass opacities in both lungs suspicious for asymmetric pulmonary edema or atypical/viral pneumonia. More focal areas of dense airspace consolidation are noted in the right lower lobe and left upper lobe which could be secondary bacterial infiltrates. Rounded nodular densities are likely infiltrates. The tracheobronchial tree is grossly normal. No findings  suspicious for aspiration. Underlying emphysematous changes and pulmonary scarring. Upper Abdomen: No significant upper abdominal findings. Stable left renal cyst not requiring any further imaging evaluation or follow-up. Stable vascular disease. Musculoskeletal: No breast masses, supraclavicular or axillary adenopathy. The bony thorax is intact. IMPRESSION: 1. Interval enlargement of bilateral pleural effusions which are moderate sized. 2. Multifocal ground-glass opacities in both lungs suspicious for asymmetric pulmonary edema or atypical/viral pneumonia. 3. More focal areas of dense airspace consolidation in the right lower lobe and left upper lobe could be secondary bacterial infiltrates. 4. Underlying emphysematous changes and pulmonary scarring. 5. Stable borderline mediastinal and hilar lymph nodes, likely reactive. 6. Stable vascular disease. Aortic Atherosclerosis (ICD10-I70.0) and Emphysema (ICD10-J43.9). Electronically Signed   By: Marrian Siva M.D.   On: 09/22/2023 16:34    Scheduled Meds:  amLODipine   10 mg Oral QHS   budeson-glycopyrrolate-formoterol   2 puff Inhalation BID   carvedilol   12.5 mg Oral BID WC   Chlorhexidine  Gluconate Cloth  6 each Topical Q0600   citalopram   10 mg Oral Daily   cyanocobalamin    1,000 mcg Oral Daily   heparin   5,000 Units Subcutaneous Q8H   hydrALAZINE   100 mg Oral Q8H   lidocaine   1 patch Transdermal Q24H   sodium bicarbonate   1,300 mg Oral BID   Continuous Infusions:  furosemide  120 mg (09/24/23 0035)     LOS: 2 days   Modena Andes, MD [facility]  09/24/2023, 8:26 AM   *Please note that this is a verbal dictation therefore any spelling or grammatical errors are due to the "Dragon [employer] One" system interpretation.  Please page via Amion and do not message via secure chat for urgent patient care matters. Secure chat can be used for non urgent patient care matters.  How to contact the [facility] Attending or Consulting provider 7A - 7P or covering provider during after hours 7P -7A, for this patient?  Check the care team in [facility] and look for a) attending/consulting [facility] provider listed and b) the [facility] team listed. Page or secure chat 7A-7P. Log into [email or web address] and use [facility] universal password to access. If you do not have the password, please contact the hospital operator. Locate the [facility] provider you are looking for under [facility] and page to a number that you can be directly reached. If you still have difficulty reaching the provider, please page the [facility] (Director on Call) for the Hospitalists listed on amion for assistance.

## 2023-09-24 NOTE — Progress Notes (Signed)
 Heart Failure Navigator Progress Note  Assessed for Heart & Vascular TOC clinic readiness.  Patient does not meet criteria due to established with advanced heart failure clinic, patient of Dr. Bruce Caper.   Navigator will sign off.  Jerilyn Monte, PharmD, BCPS Heart Failure Stewardship Pharmacist Phone [phone]

## 2023-09-25 ENCOUNTER — Other Ambulatory Visit: Payer: Self-pay

## 2023-09-25 DIAGNOSIS — I5043 Acute on chronic combined systolic (congestive) and diastolic (congestive) heart failure: Secondary | ICD-10-CM | POA: Diagnosis not present

## 2023-09-25 LAB — BASIC METABOLIC PANEL WITH GFR
Anion gap: 13 (ref 5–15)
BUN: 69 mg/dL — ABNORMAL HIGH (ref 8–23)
CO2: 23 mmol/L (ref 22–32)
Calcium: 8 mg/dL — ABNORMAL LOW (ref 8.9–10.3)
Chloride: 102 mmol/L (ref 98–111)
Creatinine, Ser: 6.03 mg/dL — ABNORMAL HIGH (ref 0.44–1.00)
GFR, Estimated: 6 mL/min — ABNORMAL LOW (ref >60)
Glucose, Bld: 104 mg/dL — ABNORMAL HIGH (ref 70–99)
Potassium: 4 mmol/L (ref 3.5–5.1)
Sodium: 138 mmol/L (ref 135–145)

## 2023-09-25 LAB — GLUCOSE, CAPILLARY
Glucose-Capillary: 111 mg/dL — ABNORMAL HIGH (ref 70–99)
Glucose-Capillary: 128 mg/dL — ABNORMAL HIGH (ref 70–99)
Glucose-Capillary: 121 mg/dL — ABNORMAL HIGH (ref 70–99)
Glucose-Capillary: 141 mg/dL — ABNORMAL HIGH (ref 70–99)

## 2023-09-25 NOTE — Progress Notes (Signed)
 PROGRESS NOTE    [name]  ZOX:[id number] DOB: [birth year] DOA: 09/21/2023 PCP: Reginal Capra, MD   Brief Narrative:  HPI: [name] is a 89 y.o. female with medical history significant of moderate persistent asthma, chronic combined systolic and diastolic CHF, CKD stage V, anemia of CKD, hypertension, hyperlipidemia, GERD who presented to the ED with shortness of breath.   Patient states that she has been having worsening shortness of breath over the past 2 to 3 days.  She feels that she has been wheezing as well.  She thought that her symptoms were coming from her underlying asthma as she has required hospitalization for it in the past.  She used her home inhalers without much improvement in her symptoms.  She has also noted increasing leg swelling.  Given that her shortness of breath was not improving, this prompted her to come to the ED for further evaluation.  She denies any fevers, chills, nausea, vomiting, chest pain, palpitations, worsening cough, abdominal pain, urinary changes.   [facility]: Vital signs notable for tachypnea, hypertension to the 200s/80s, new oxygen requirement of 2 L Start.  CBC showing hemoglobin 9.8 (around her baseline).  BMP showing bicarbonate 20, glucose 121, creatinine 5.9 (baseline around 5.5-5.7), BUN 65, anion gap 17.  Troponin 165.  proBNP greater than 35,000.  Chest x-ray showing chronic appearing increased interstitial lung markings with multifocal areas of airspace disease bilaterally along with small bilateral pleural effusions (CT chest recommended for further evaluation). Patient given albuterol  nebs, IV Lasix  40 mg, DuoNebs, IV hydralazine  and IV labetalol  in the ED.  Patient transferred to [facility] for further evaluation and care.  Assessment & Plan:   Principal Problem:   Acute exacerbation of CHF (congestive heart failure) (HCC)  Acute hypoxic respiratory failure secondary to acute on chronic combined systolic and diastolic HF  /Pulmonary hypertension, POA: Presented hypoxic, 87% on room air, required 2 L of oxygen, proBNP > 35,000 and chest x-ray with vascular congestion and crackles on the clinical examination.  Received a dose of IV Lasix  40 in the ED, nephrology consulted and per their recommendations, Foley catheter was placed and patient was started on Lasix  100 mg IV twice daily.  She only had 850 cc output in 24 hours so Lasix  was increased by nephrology to 120 mg twice daily and she has had 1750 output in the last 24 hours a day ago.  Nephrology felt that she was improving so they transitioned her to torsemide  yesterday.  However she still felt short of breath.  Fortunately, she said that she is feeling better today.  She still has baseline crackles but improving.  She is not hypoxic.   CKD Stage V/ Anion gap metabolic acidosis, POA: Patient with baseline creatinine around 5.5-5.7.  On admission, creatinine 5.9 with GFR 6.  She had metabolic acidosis.  Nephrology consulted and then sign off 09/24/2023.  She is on bicarb tablets which she may not need at the time of discharge.  Renal function at baseline.  Patient has declined hemodialysis in the past and again this time.  Avoid nephrotoxic agents.  Monitor labs closely.  Appreciate nephrology managing this.  Hypertensive urgency Blood pressure as high as systolic 203 and diastolic 91 so far.  Her home medications resumed.  She is on carvedilol  12.5 mg twice daily, hydralazine  100 mg 3 times daily, amlodipine  10 mg daily.  Blood pressure improving.   NSTEMI ruled out/demand ischemia  EKG showing sinus rhythm.  Troponin elevated  186> 203>200.  Her troponins event previously was elevated until 154 ,9 months ago.  In the setting of no chest pain and hypertensive urgency, suspect elevated troponin is secondary to demand ischemia.  Monitor on telemetry.   Moderate persistent asthma No wheezing on examination.  Not in exacerbation.  Continue albuterol  nebs every 6 hours as  needed, breztri  twice daily   Anemia of CKD Baseline hemoglobin around 9-10.  On admission, hemoglobin 9.8 which is down to 8.2 yesterday.  No signs of bleeding.  Will defer to nephrology for ESA.  Will transfuse if it drops to less than 7.   GERD - reported not taking protonix  since April   Hyperlipidemia - Not on any medications for this in the outpatient setting.  Generalized weakness: Seen by PT OT, recommendation for SNF, TOC working on it.  DVT prophylaxis: heparin  injection 5,000 Units Start: 09/22/23 0930   Code Status: Full Code  Family Communication:  None present at bedside.  Plan of care discussed with patient in length and he/she verbalized understanding and agreed with it.  Status is: Inpatient Remains inpatient appropriate because: Patient is medically stable, needs placement to SNF, TOC working on it.   Estimated body mass index is 27.34 kg/m as calculated from the following:   Height as of this encounter: 5\' 2"  (1.575 m).   Weight as of this encounter: 67.8 kg.    Nutritional Assessment: Body mass index is 27.34 kg/m.Aaron Aas Seen by dietician.  I agree with the assessment and plan as outlined below: Nutrition Status:        . Skin Assessment: I have examined the patient's skin and I agree with the wound assessment as performed by the wound care RN as outlined below:    Consultants:  Nephrology  Procedures:  None  Antimicrobials:  Anti-infectives (From admission, onward)    None         Subjective: Patient seen and examined.  No family members at the bedside.  Patient for the first time saying that she is feeling better with no shortness of breath.  She is fully alert and oriented and cooperative and wants to go to SNF.  Objective: Vitals:   09/25/23 0433 09/25/23 0737 09/25/23 0759 09/25/23 0943  BP: (!) 155/61  (!) 152/61 (!) 152/61  Pulse: 62 68  66  Resp: 16 18 17    Temp: 98.5 F (36.9 C)  99.2 F (37.3 C)   TempSrc: Oral  Oral    SpO2: 97%     Weight: 67.8 kg     Height:        Intake/Output Summary (Last 24 hours) at 09/25/2023 1007 Last data filed at 09/25/2023 0438 Gross per 24 hour  Intake --  Output 1075 ml  Net -1075 ml   Filed Weights   09/23/23 0620 09/23/23 1619 09/25/23 0433  Weight: 62.8 kg 62.8 kg 67.8 kg    Examination:  General exam: Appears calm and comfortable  Respiratory system: Faint bibasilar crackles. Respiratory effort normal. Cardiovascular system: S1 & S2 heard, RRR. No JVD, murmurs, rubs, gallops or clicks.  Trace pitting edema bilateral lower extremity. Gastrointestinal system: Abdomen is nondistended, soft and nontender. No organomegaly or masses felt. Normal bowel sounds heard. Central nervous system: Alert and oriented. No focal neurological deficits. Extremities: Symmetric 5 x 5 power. Skin: No rashes, lesions or ulcers.  Psychiatry: Judgement and insight appear normal. Mood & affect appropriate.   Data Reviewed: I have personally reviewed following labs and imaging studies  CBC: Recent Labs  Lab 09/22/23 0036 09/23/23 0437 09/24/23 0326  WBC 7.2 4.8 4.9  NEUTROABS  --   --  3.4  HGB 9.8* 8.2* 8.2*  HCT 32.0* 27.3* 26.0*  MCV 97.3 97.5 95.2  PLT 198 162 165   Basic Metabolic Panel: Recent Labs  Lab 09/22/23 0036 09/23/23 0437 09/24/23 0326 09/25/23 0354  NA 142 140 139 138  K 4.5 4.3 4.2 4.0  CL 106 106 104 102  CO2 20* 20* 21* 23  GLUCOSE 121* 113* 106* 104*  BUN 65* 68* 68* 69*  CREATININE 5.90* 5.80* 6.03* 6.03*  CALCIUM  9.3 8.5* 8.1* 8.0*  PHOS  --  6.3*  --   --    GFR: Estimated Creatinine Clearance: 5.9 mL/min (A) (by C-G formula based on SCr of 6.03 mg/dL (H)). Liver Function Tests: Recent Labs  Lab 09/23/23 0437  ALBUMIN  2.4*   No results for input(s): "LIPASE", "AMYLASE" in the last 168 hours. No results for input(s): "AMMONIA" in the last 168 hours. Coagulation Profile: No results for input(s): "INR", "PROTIME" in the last 168  hours. Cardiac Enzymes: No results for input(s): "CKTOTAL", "CKMB", "CKMBINDEX", "TROPONINI" in the last 168 hours. BNP (last 3 results) Recent Labs    09/22/23 0036  PROBNP >35,000.0*   HbA1C: No results for input(s): "HGBA1C" in the last 72 hours.  CBG: Recent Labs  Lab 09/23/23 2113 09/24/23 0800 09/24/23 1211 09/24/23 2058 09/25/23 0755  GLUCAP 128* 118* 123* 165* 111*   Lipid Profile: No results for input(s): "CHOL", "HDL", "LDLCALC", "TRIG", "CHOLHDL", "LDLDIRECT" in the last 72 hours. Thyroid  Function Tests: No results for input(s): "TSH", "T4TOTAL", "FREET4", "T3FREE", "THYROIDAB" in the last 72 hours. Anemia Panel: Recent Labs    09/22/23 1144  FERRITIN 259  TIBC 192*  IRON 19*   Sepsis Labs: No results for input(s): "PROCALCITON", "LATICACIDVEN" in the last 168 hours.  No results found for this or any previous visit (from the past 240 hours).   Radiology Studies: ECHOCARDIOGRAM COMPLETE Result Date: 09/23/2023    ECHOCARDIOGRAM REPORT   Patient Name:   [name] [facility] Date of Exam: 09/23/2023 Medical Rec #:  [id number]          Height:       62.0 in Accession #:    [id number]         Weight:       138.4 lb Date of Birth:  07/27/1933           BSA:          1.635 m Patient Age:    89 years           BP:           175/63 mmHg Patient Gender: F                  HR:           68 bpm. Exam Location:  Inpatient Procedure: 2D Echo, Cardiac Doppler and Color Doppler (Both Spectral and Color            Flow Doppler were utilized during procedure). Indications:    CHF-Acute Systolic I50.21  History:        Patient has prior history of Echocardiogram examinations, most                 recent 08/08/2023. CHF; Risk Factors:Hypertension, Diabetes and                 Dyslipidemia. Hx  of CKD.  Sonographer:    Denese Finn RCS Referring Phys: [id number] SAGAR H JINWALA IMPRESSIONS  1. Left ventricular ejection fraction, by estimation, is 45 to 50%. The left ventricle has mildly  decreased function. The left ventricle demonstrates global hypokinesis. There is severe left ventricular hypertrophy. Left ventricular diastolic parameters  are consistent with Grade III diastolic dysfunction (restrictive). Elevated left atrial pressure.  2. Right ventricular systolic function is normal. The right ventricular size is normal. There is severely elevated pulmonary artery systolic pressure. The estimated right ventricular systolic pressure is 86.6 mmHg.  3. Left atrial size was severely dilated.  4. Right atrial size was severely dilated.  5. The mitral valve is abnormal. Thickened leaflets. Moderate mitral valve regurgitation.  6. Tricuspid valve regurgitation is mild to moderate.  7. The aortic valve is tricuspid. Aortic valve regurgitation is trivial. Aortic valve sclerosis is present, with no evidence of aortic valve stenosis.  8. The inferior vena cava is dilated in size with <50% respiratory variability, suggesting right atrial pressure of 15 mmHg. FINDINGS  Left Ventricle: Left ventricular ejection fraction, by estimation, is 45 to 50%. The left ventricle has mildly decreased function. The left ventricle demonstrates global hypokinesis. The left ventricular internal cavity size was normal in size. There is  severe left ventricular hypertrophy. Left ventricular diastolic parameters are consistent with Grade III diastolic dysfunction (restrictive). Elevated left atrial pressure. Right Ventricle: The right ventricular size is normal. No increase in right ventricular wall thickness. Right ventricular systolic function is normal. There is severely elevated pulmonary artery systolic pressure. The tricuspid regurgitant velocity is 4.23 m/s, and with an assumed right atrial pressure of 15 mmHg, the estimated right ventricular systolic pressure is 86.6 mmHg. Left Atrium: Left atrial size was severely dilated. Right Atrium: Right atrial size was severely dilated. Pericardium: Trivial pericardial effusion  is present. Mitral Valve: The mitral valve is abnormal. Moderate mitral valve regurgitation. Tricuspid Valve: The tricuspid valve is normal in structure. Tricuspid valve regurgitation is mild to moderate. Aortic Valve: The aortic valve is tricuspid. Aortic valve regurgitation is trivial. Aortic valve sclerosis is present, with no evidence of aortic valve stenosis. Pulmonic Valve: The pulmonic valve was grossly normal. Pulmonic valve regurgitation is trivial. Aorta: The aortic root is normal in size and structure. Venous: The inferior vena cava is dilated in size with less than 50% respiratory variability, suggesting right atrial pressure of 15 mmHg. IAS/Shunts: The interatrial septum was not well visualized.  LEFT VENTRICLE PLAX 2D LVIDd:         4.90 cm   Diastology LVIDs:         3.50 cm   LV e' medial:    4.16 cm/s LV PW:         1.60 cm   LV E/e' medial:  34.3 LV IVS:        1.50 cm   LV e' lateral:   4.99 cm/s LVOT diam:     2.00 cm   LV E/e' lateral: 28.7 LV SV:         74 LV SV Index:   46 LVOT Area:     3.14 cm  RIGHT VENTRICLE RV S prime:     11.50 cm/s TAPSE (M-mode): 2.4 cm LEFT ATRIUM             Index        RIGHT ATRIUM           Index LA diam:  4.50 cm 2.75 cm/m   RA Area:     24.70 cm LA Vol (A2C):   89.5 ml 54.74 ml/m  RA Volume:   87.80 ml  53.70 ml/m LA Vol (A4C):   90.6 ml 55.41 ml/m LA Biplane Vol: 90.6 ml 55.41 ml/m  AORTIC VALVE LVOT Vmax:   102.00 cm/s LVOT Vmean:  68.200 cm/s LVOT VTI:    0.237 m  AORTA Ao Root diam: 3.10 cm MITRAL VALVE                  TRICUSPID VALVE MV Area (PHT): 7.66 cm       TR Peak grad:   71.6 mmHg MV Decel Time: 99 msec        TR Vmax:        423.00 cm/s MR Peak grad:    134.6 mmHg MR Mean grad:    92.0 mmHg    SHUNTS MR Vmax:         580.00 cm/s  Systemic VTI:  0.24 m MR Vmean:        454.0 cm/s   Systemic Diam: 2.00 cm MR PISA:         3.08 cm MR PISA Eff ROA: 12 mm MR PISA Radius:  0.70 cm MV E velocity: 143.00 cm/s MV A velocity: 69.80 cm/s MV  E/A ratio:  2.05 Carson Clara MD Electronically signed by Carson Clara MD Signature Date/Time: 09/23/2023/7:01:41 PM    Final     Scheduled Meds:  amLODipine   10 mg Oral QHS   budeson-glycopyrrolate-formoterol   2 puff Inhalation BID   carvedilol   12.5 mg Oral BID WC   Chlorhexidine  Gluconate Cloth  6 each Topical Q0600   citalopram   10 mg Oral Daily   cyanocobalamin   1,000 mcg Oral Daily   darbepoetin (ARANESP ) injection - NON-DIALYSIS  60 mcg Subcutaneous Q Mon-1800   ferrous sulfate   325 mg Oral BID WC   heparin   5,000 Units Subcutaneous Q8H   hydrALAZINE   100 mg Oral Q8H   lidocaine   1 patch Transdermal Q24H   sodium bicarbonate   650 mg Oral BID   torsemide   40 mg Oral BID   Continuous Infusions:     LOS: 3 days   Modena Andes, MD [facility]  09/25/2023, 10:07 AM   *Please note that this is a verbal dictation therefore any spelling or grammatical errors are due to the "Dragon [employer] One" system interpretation.  Please page via Amion and do not message via secure chat for urgent patient care matters. Secure chat can be used for non urgent patient care matters.  How to contact the [facility] Attending or Consulting provider 7A - 7P or covering provider during after hours 7P -7A, for this patient?  Check the care team in [facility] and look for a) attending/consulting [facility] provider listed and b) the [facility] team listed. Page or secure chat 7A-7P. Log into [email or web address] and use 's universal password to access. If you do not have the password, please contact the hospital operator. Locate the [facility] provider you are looking for under [facility] and page to a number that you can be directly reached. If you still have difficulty reaching the provider, please page the [facility] (Director on Call) for the Hospitalists listed on amion for assistance.

## 2023-09-25 NOTE — TOC Progression Note (Addendum)
 Transition of Care [facility]) - Progression Note    Patient Details  Name: [name] MRN: [id number] Date of Birth: February 18, 1934  [facility] CM/SW Contact  Carmon Christen, LCSWA Phone Number: 09/25/2023, 10:43 AM  Clinical Narrative:     CSW informed by CM that patients Granddaughter now interested in SNF for patient.Due to patients current orientation CSW spoke with patients Granddaughter [name] regarding dc plans for patient. [name] informed CSW that she is now agreeable for CSW to fax out patients initial referral for possible SNF placement for patient. CSW faxed out initial referral for possible SNF placement for patient. CSW awaiting bed offers. Patients passr pending. CSW submitted requested clinicals to [facility] for review. CSW will continue to follow.  Update- CSW provided SNF bed offers to patients Granddaughter [name]. [name] is going to review and give CSW a call back with SNF choice.  Update- CSW received call back from patients Granddaughter [name] who accepted SNF bed offer with [facility] for patient. CSW confirmed SNF bed with Dor[facility] at [facility]. CSW will start insurance authorization for patient.  Update- Patients insurance authorization has been approved. Auth ID# [id number]. Insurance authorization approved from 5/14-5/16. Patients passr pending.  Expected Discharge Plan: Home/Self Care Barriers to Discharge: Continued Medical Work up  Expected Discharge Plan and Services In-house Referral: Clinical Social Work     Living arrangements for the past 2 months: Apartment                                       Social Determinants of Health (SDOH) Interventions SDOH Screenings   Food Insecurity: No Food Insecurity (09/23/2023)  Recent Concern: Food Insecurity - Food Insecurity Present (08/08/2023)  Housing: Low Risk  (09/23/2023)  Transportation Needs: No Transportation Needs (09/23/2023)  Utilities: Not At Risk (09/23/2023)  Alcohol  Screen: Low Risk  (09/21/2023)  Depression (PHQ2-9): Low Risk  (09/21/2023)  Financial Resource Strain: Low Risk  (09/21/2023)  Physical Activity: Sufficiently Active (09/21/2023)  Social Connections: Moderately Integrated (09/23/2023)  Recent Concern: Social Connections - Moderately Isolated (08/08/2023)  Stress: No Stress Concern Present (09/21/2023)  Tobacco Use: Low Risk  (09/23/2023)  Health Literacy: Adequate Health Literacy (09/21/2023)    Readmission Risk Interventions    08/08/2023    3:21 PM 11/02/2022   12:32 PM  Readmission Risk Prevention Plan  Transportation Screening  Complete  Medication Review (RN Care Manager) Complete Complete  PCP or Specialist appointment within 3-5 days of discharge Complete Complete  HRI or Home Care Consult Complete Complete  SW Recovery Care/Counseling Consult  Complete  Palliative Care Screening  Not Applicable  Skilled Nursing Facility Complete Not Applicable

## 2023-09-25 NOTE — Progress Notes (Addendum)
 RE: [name]  Date of Birth: Jun 24, 1933  Date: 09/25/2023    To Whom It May Concern:   Please be advised that the above-named patient will require a short-term nursing home stay - anticipated 30 days or less for rehabilitation and strengthening. The plan is for return home.

## 2023-09-25 NOTE — Progress Notes (Signed)
 Physical Therapy Treatment Patient Details Name: [name] MRN: [id number] DOB: 12/27/1933 Today's Date: 09/25/2023   History of Present Illness [name] is a 89 y.o. female admitted 09/21/23 for CHF exacerbation. Pt presented with worsening SOB and increased LE swelling. Chest x-ray showing chronic appearing increased interstitial lung markings with multifocal areas of airspace disease bilaterally along with small bilateral pleural effusions. PMH significant for moderate persistent asthma, chronic combined systolic and diastolic CHF, CKD stage V, anemia of CKD, hypertension, hyperlipidemia, and GERD.    PT Comments  Pt reports having a pain in her R neck which she reports is likely due to falling asleep in recliner. Pt is limited in safe mobility by decreased strength and endurance. Pt requires modA for coming to standing and min A for ambulation in room. Pt returns to sitting in recliner at end of ambulation with CGA. D/c plans remain appropriate. PT will continue to follow acutely.     If plan is discharge home, recommend the following: A lot of help with walking and/or transfers;A lot of help with bathing/dressing/bathroom;Assistance with cooking/housework;Assist for transportation   Can travel by private vehicle     No  Equipment Recommendations  Wheelchair (measurements PT);Wheelchair cushion (measurements PT);BSC/3in1;Rolling walker (2 wheels)       Precautions / Restrictions Precautions Precautions: Fall Recall of Precautions/Restrictions: Intact Restrictions Weight Bearing Restrictions Per Provider Order: No     Mobility  Bed Mobility               General bed mobility comments: sitting up in recliner    Transfers Overall transfer level: Needs assistance Equipment used: Rolling walker (2 wheels) Transfers: Sit to/from Stand, Bed to chair/wheelchair/BSC Sit to Stand: Mod assist, From elevated surface, Contact guard assist           General  transfer comment: pt attempts to come to standing pushing off of armrests but barely able to clear hips from recliner, pt holds onto PT elbows for face to face sit to stand transfer, able to transition over to RW after standing, supervision for sitting back down after ambulation    Ambulation/Gait Ambulation/Gait assistance: Min assist Gait Distance (Feet): 20 Feet Assistive device: Rolling walker (2 wheels) Gait Pattern/deviations: Step-through pattern, Decreased stride length, Shuffle, Trunk flexed Gait velocity: slowed Gait velocity interpretation: <1.31 ft/sec, indicative of household ambulator   General Gait Details: min A for steadying, pt able to walk to door and back with continually slower gait, vc for proximity to RW and upright posture       Balance Overall balance assessment: Needs assistance Sitting-balance support: Bilateral upper extremity supported, Feet supported Sitting balance-Leahy Scale: Fair Sitting balance - Comments: Pt required min-modA to aid in scooting EOB and in recliner chair.   Standing balance support: Bilateral upper extremity supported, During functional activity, Reliant on assistive device for balance Standing balance-Leahy Scale: Poor Standing balance comment: Pt dependent on RW and required modA to transfer.                            Communication Communication Communication: Impaired Factors Affecting Communication: Reduced clarity of speech;Difficulty expressing self (very soft spoken)  Cognition Arousal: Alert Behavior During Therapy: Flat affect   PT - Cognitive impairments: No family/caregiver present to determine baseline, Sequencing, Initiation                       PT - Cognition Comments: Pt A,Ox4.  She appeared to have delayed processing. Pt was unable to rate her pain on a scale of 0-10 and took increase time to follow sensation testing. Following commands: Intact      Cueing Cueing Techniques: Verbal cues      General Comments General comments (skin integrity, edema, etc.): VSS on RA      Pertinent Vitals/Pain Pain Assessment Pain Assessment: Faces Faces Pain Scale: Hurts even more Pain Location: R neck Pain Descriptors / Indicators: Grimacing, Guarding, Tender, Discomfort Pain Intervention(s): Monitored during session, Repositioned     PT Goals (current goals can now be found in the care plan section) Acute Rehab PT Goals PT Goal Formulation: With patient Time For Goal Achievement: 10/07/23 Potential to Achieve Goals: Good Progress towards PT goals: Progressing toward goals    Frequency    Min 2X/week       AM-PAC PT "6 Clicks" Mobility   Outcome Measure  Help needed turning from your back to your side while in a flat bed without using bedrails?: A Lot Help needed moving from lying on your back to sitting on the side of a flat bed without using bedrails?: A Lot Help needed moving to and from a bed to a chair (including a wheelchair)?: A Lot Help needed standing up from a chair using your arms (e.g., wheelchair or bedside chair)?: A Lot Help needed to walk in hospital room?: Total Help needed climbing 3-5 steps with a railing? : Total 6 Click Score: 10    End of Session Equipment Utilized During Treatment: Gait belt Activity Tolerance: Patient limited by fatigue Patient left: in chair;with call bell/phone within reach;with chair alarm set Nurse Communication: Mobility status PT Visit Diagnosis: Muscle weakness (generalized) (M62.81);Difficulty in walking, not elsewhere classified (R26.2);Unsteadiness on feet (R26.81);Other abnormalities of gait and mobility (R26.89);Pain Pain - Right/Left: Right Pain - part of body: Knee     Time: 1610-9604 PT Time Calculation (min) (ACUTE ONLY): 17 min  Charges:    $Gait Training: 8-22 mins PT General Charges $$ ACUTE PT VISIT: 1 Visit                     Ubah Radke B. Jewel Mortimer PT, DPT Acute Rehabilitation  Services Please use secure chat or  Call Office [phone]    Verlie Glisson [facility] 09/25/2023, 4:01 PM

## 2023-09-25 NOTE — Progress Notes (Signed)
 Mobility Specialist Progress Note;   09/25/23 0857  Mobility  Activity Transferred from bed to chair  Level of Assistance Minimal assist, patient does 75% or more  Assistive Device Front wheel walker  Distance Ambulated (ft) 3 ft  Activity Response Tolerated well  Mobility Referral Yes  Mobility visit 1 Mobility  Mobility Specialist Start Time (ACUTE ONLY) 0857  Mobility Specialist Stop Time (ACUTE ONLY) 0908  Mobility Specialist Time Calculation (min) (ACUTE ONLY) 11 min   Pt agreeable to mobility. Required ModA for bed mobility w/ use of bed pads, and ModA to stand and pivot to chair. VC required for foot progression and safety. VSS throughout and no c/o when asked. Pt left in chair with all needs met, BLE elevated. Alarm on.   Janit Meline Mobility Specialist Please contact via SecureChat or [employer] [phone]

## 2023-09-25 NOTE — NC FL2 (Signed)
 [place]  MEDICAID FL2 LEVEL OF CARE FORM     IDENTIFICATION  Patient Name: [name] Birthdate: 1933-12-11 Sex: female Admission Date (Current Location): 09/21/2023  [facility] and [place] Number:  Producer, television/film/video and Address:  [facility] [place], [address] [place], [place] [address]      Provider Number: [id number]  Attending Physician Name and Address:  Modena Andes, MD  Relative Name and Phone Number:  [name] (Granddaughter) [phone]    Current Level of Care: Hospital Recommended Level of Care: Skilled Nursing Facility Prior Approval Number:    Date Approved/Denied:   PASRR Number: PASRR under review  Discharge Plan: SNF    Current Diagnoses: Patient Active Problem List   Diagnosis Date Noted   Acute exacerbation of CHF (congestive heart failure) (HCC) 09/22/2023   Acute kidney injury superimposed on stage 5 chronic kidney disease, not on chronic dialysis (HCC) 08/08/2023   Hypertensive nephrosclerosis 08/08/2023   Anemia of chronic kidney failure, stage 5 (HCC) 08/08/2023   Metabolic acidosis 08/08/2023   Acute exacerbation of moderate persistent extrinsic asthma 08/08/2023   Acute on chronic heart failure with preserved ejection fraction (HFpEF) (HCC) 08/08/2023   AKI (acute kidney injury) (HCC) 08/08/2023   CAP (community acquired pneumonia) 10/31/2022   Hypertensive urgency 10/31/2022   Moderate persistent asthma 10/31/2022   Hypomagnesemia 10/31/2022   CKD (chronic kidney disease), stage IV (HCC) 10/31/2022   Pulmonary hypertension, unspecified (HCC) 12/06/2021   Chronic cough 10/18/2021   Chronic rhinitis 11/24/2019   Nausea 11/03/2018   Elevated troponin 11/03/2018   Hyponatremia 11/03/2018   Hypertensive heart disease with hypertensive chronic kidney disease 11/30/2017   Fasting hyperglycemia 11/30/2017   Renal cyst 03/27/2016   CKD (chronic kidney disease) stage 3, GFR 30-59 ml/min (HCC) 08/23/2015   Subjective  hearing change 06/29/2014   Resistant hypertension 06/29/2014   Lumbago 06/15/2014   Pre-diabetes vs early DM  06/15/2014   Dyspnea 04/27/2014   Asthma, chronic 04/13/2014   Elevated uric acid in blood 04/13/2014   Essential hypertension 03/27/2014   History of DVT (deep vein thrombosis)    Palpitations 01/05/2014   Anemia of chronic disease 28-Dec-2013   Death of family member 09/01/13   Leg edema 06/20/2013   Bereavement due to life event 04/21/2013   Other dysphagia 02/11/2013   Colon cancer screening 02/11/2013   Anxiety state 01/20/2013   Leg cramps 01/20/2013   Back pain 12/05/2012   Family hx of colon cancer 10/21/2012   Family hx of lung cancer 10/21/2012   Family hx-breast malignancy 10/21/2012   Renal insufficiency creatinine 1.3 10/21/2012   Anemia, chronic disease 10/21/2012   GERD (gastroesophageal reflux disease) 09/08/2012   HTN (hypertension) 09/08/2012   Hyperlipidemia 09/08/2012   Varicose veins of leg with complications 12/05/2010    Orientation RESPIRATION BLADDER Height & Weight     Place, Self  Normal Continent Weight: 149 lb 7.6 oz (67.8 kg) Height:  5\' 2"  (157.5 cm)  BEHAVIORAL SYMPTOMS/MOOD NEUROLOGICAL BOWEL NUTRITION STATUS      Continent Diet (Please see discharge summary)  AMBULATORY STATUS COMMUNICATION OF NEEDS Skin   Extensive Assist Verbally Other (Comment) (WDL,Wound/Incision LDAs)                       Personal Care Assistance Level of Assistance  Bathing, Feeding, Dressing Bathing Assistance: Maximum assistance Feeding assistance: Independent Dressing Assistance: Maximum assistance     Functional Limitations Info  Sight, Hearing, Speech Sight  Info: Impaired Hearing Info: Adequate Speech Info: Adequate    SPECIAL CARE FACTORS FREQUENCY  PT (By licensed PT), OT (By licensed OT)     PT Frequency: 5x min weekly OT Frequency: 5x min weekly            Contractures Contractures Info: Not present    Additional  Factors Info  Code Status, Allergies, Psychotropic Code Status Info: FULL Allergies Info: Penicillins,Asa (aspirin),Fish Allergy Psychotropic Info: citalopram  (CELEXA ) tablet 10 mg daily         Current Medications (09/25/2023):  This is the current hospital active medication list Current Facility-Administered Medications  Medication Dose Route Frequency Provider Last Rate Last Admin   acetaminophen  (TYLENOL ) tablet 650 mg  650 mg Oral Q6H PRN Jinwala, Sagar H, MD   650 mg at 09/24/23 0327   Or   acetaminophen  (TYLENOL ) suppository 650 mg  650 mg Rectal Q6H PRN Jinwala, Sagar H, MD       albuterol  (PROVENTIL ) (2.5 MG/3ML) 0.083% nebulizer solution 2.5 mg  2.5 mg Nebulization Q6H PRN Jinwala, Sagar H, MD       amLODipine  (NORVASC ) tablet 10 mg  10 mg Oral QHS Jinwala, Sagar H, MD   10 mg at 09/24/23 2153   budeson-glycopyrrolate-formoterol  (BREZTRI ) 160-9-4.8 MCG/ACT inhaler 2 puff  2 puff Inhalation BID Jinwala, Sagar H, MD   2 puff at 09/25/23 8119   carvedilol  (COREG ) tablet 12.5 mg  12.5 mg Oral BID WC Kingsley, Victoria K, DO   12.5 mg at 09/25/23 1478   Chlorhexidine  Gluconate Cloth 2 % PADS 6 each  6 each Topical Q0600 Jinwala, Sagar H, MD   6 each at 09/25/23 2956   citalopram  (CELEXA ) tablet 10 mg  10 mg Oral Daily Jinwala, Sagar H, MD   10 mg at 09/25/23 2130   cyanocobalamin  (VITAMIN B12) tablet 1,000 mcg  1,000 mcg Oral Daily Jinwala, Sagar H, MD   1,000 mcg at 09/24/23 1457   Darbepoetin Alfa  (ARANESP ) injection 60 mcg  60 mcg Subcutaneous Q Mon-1800 Clementine Cutting, MD   60 mcg at 09/24/23 1736   ferrous sulfate  tablet 325 mg  325 mg Oral BID WC Clementine Cutting, MD   325 mg at 09/25/23 0943   haloperidol lactate (HALDOL) injection 5 mg  5 mg Intravenous Q6H PRN Pahwani, Ravi, MD       heparin  injection 5,000 Units  5,000 Units Subcutaneous Q8H Jinwala, Sagar H, MD   5,000 Units at 09/25/23 0615   hydrALAZINE  (APRESOLINE ) tablet 100 mg  100 mg Oral Q8H Kingsley,  Victoria K, DO   100 mg at 09/25/23 8657   lidocaine  (LIDODERM ) 5 % 1 patch  1 patch Transdermal Q24H Rathore, Vasundhra, MD   1 patch at 09/24/23 0035   Muscle Rub CREA   Topical PRN Rathore, Vasundhra, MD   1 Application at 09/23/23 0618   sodium bicarbonate  tablet 650 mg  650 mg Oral BID Clementine Cutting, MD   650 mg at 09/25/23 8469   torsemide  (DEMADEX ) tablet 40 mg  40 mg Oral BID Clementine Cutting, MD   40 mg at 09/25/23 6295     Discharge Medications: Please see discharge summary for a list of discharge medications.  Relevant Imaging Results:  Relevant Lab Results:   Additional Information [id number]  Carmon Christen, LCSWA

## 2023-09-25 NOTE — Plan of Care (Signed)

## 2023-09-26 DIAGNOSIS — I509 Heart failure, unspecified: Secondary | ICD-10-CM | POA: Diagnosis not present

## 2023-09-26 LAB — CBC
HCT: 27.3 % — ABNORMAL LOW (ref 36.0–46.0)
Hemoglobin: 8.4 g/dL — ABNORMAL LOW (ref 12.0–15.0)
MCH: 29.2 pg (ref 26.0–34.0)
MCHC: 30.8 g/dL (ref 30.0–36.0)
MCV: 94.8 fL (ref 80.0–100.0)
Platelets: 203 10*3/uL (ref 150–400)
RBC: 2.88 MIL/uL — ABNORMAL LOW (ref 3.87–5.11)
RDW: 15.4 % (ref 11.5–15.5)
WBC: 5 10*3/uL (ref 4.0–10.5)
nRBC: 0 % (ref 0.0–0.2)

## 2023-09-26 LAB — GLUCOSE, CAPILLARY
Glucose-Capillary: 108 mg/dL — ABNORMAL HIGH (ref 70–99)
Glucose-Capillary: 123 mg/dL — ABNORMAL HIGH (ref 70–99)

## 2023-09-26 LAB — BASIC METABOLIC PANEL WITH GFR
Anion gap: 11 (ref 5–15)
BUN: 66 mg/dL — ABNORMAL HIGH (ref 8–23)
CO2: 23 mmol/L (ref 22–32)
Calcium: 7.9 mg/dL — ABNORMAL LOW (ref 8.9–10.3)
Chloride: 103 mmol/L (ref 98–111)
Creatinine, Ser: 5.96 mg/dL — ABNORMAL HIGH (ref 0.44–1.00)
GFR, Estimated: 6 mL/min — ABNORMAL LOW (ref >60)
Glucose, Bld: 106 mg/dL — ABNORMAL HIGH (ref 70–99)
Potassium: 3.8 mmol/L (ref 3.5–5.1)
Sodium: 137 mmol/L (ref 135–145)

## 2023-09-26 MED ORDER — LORAZEPAM 0.5 MG PO TABS: 0.5000 mg | ORAL_TABLET | Freq: Two times a day (BID) | ORAL | 0 refills | Status: DC | PRN

## 2023-09-26 MED ORDER — SODIUM BICARBONATE 650 MG PO TABS: 650.0000 mg | ORAL_TABLET | Freq: Two times a day (BID) | ORAL | 0 refills | Status: DC

## 2023-09-26 MED ORDER — LIDOCAINE 5 % EX PTCH: 1.0000 | MEDICATED_PATCH | CUTANEOUS | 0 refills | Status: AC

## 2023-09-26 MED ORDER — TORSEMIDE 40 MG PO TABS: 40.0000 mg | ORAL_TABLET | Freq: Two times a day (BID) | ORAL | 1 refills | Status: DC

## 2023-09-26 MED ORDER — ACETAMINOPHEN 325 MG PO TABS: 650.0000 mg | ORAL_TABLET | Freq: Four times a day (QID) | ORAL | 0 refills | Status: AC | PRN

## 2023-09-26 NOTE — Progress Notes (Signed)
 Mobility Specialist Progress Note;   09/26/23 0955  Mobility  Activity Transferred to/from [facility]  Level of Assistance Moderate assist, patient does 50-74%  Assistive Device Other (Comment) (HHA)  Distance Ambulated (ft) 2 ft  Activity Response Tolerated well  Mobility Referral Yes  Mobility visit 1 Mobility  Mobility Specialist Start Time (ACUTE ONLY) 0955  Mobility Specialist Stop Time (ACUTE ONLY) 1006  Mobility Specialist Time Calculation (min) (ACUTE ONLY) 11 min   Pt agreeable to mobility, requested assistance to [facility]. Required ModA for bed mobility w/ use of bed pads and ModA to stand and pivot to [facility]. Void successful. Deferred sitting in chair at this time d/t having a headache. Pt returned back to bed with all needs met, alarm on. Daughter in room.   Janit Meline Mobility Specialist Please contact via SecureChat or [employer] [phone]

## 2023-09-26 NOTE — TOC Progression Note (Signed)
 Transition of Care [facility]) - Progression Note    Patient Details  Name: [name] MRN: [id number] Date of Birth: 12-23-1933  Transition of Care [facility]) CM/SW Contact  Carmon Christen, LCSWA Phone Number: 09/26/2023, 10:10 AM  Clinical Narrative:     Patients passr is pending. Patient has SNF bed at [facility]. Insurance authorization approved. CSW will continue to follow and assist with patients dc planning needs.   Expected Discharge Plan: Home/Self Care Barriers to Discharge: Continued Medical Work up  Expected Discharge Plan and Services In-house Referral: Clinical Social Work     Living arrangements for the past 2 months: Apartment                                       Social Determinants of Health (SDOH) Interventions SDOH Screenings   Food Insecurity: No Food Insecurity (09/23/2023)  Recent Concern: Food Insecurity - Food Insecurity Present (08/08/2023)  Housing: Low Risk  (09/23/2023)  Transportation Needs: No Transportation Needs (09/23/2023)  Utilities: Not At Risk (09/23/2023)  Alcohol Screen: Low Risk  (09/21/2023)  Depression (PHQ2-9): Low Risk  (09/21/2023)  Financial Resource Strain: Low Risk  (09/21/2023)  Physical Activity: Sufficiently Active (09/21/2023)  Social Connections: Moderately Integrated (09/23/2023)  Recent Concern: Social Connections - Moderately Isolated (08/08/2023)  Stress: No Stress Concern Present (09/21/2023)  Tobacco Use: Low Risk  (09/23/2023)  Health Literacy: Adequate Health Literacy (09/21/2023)    Readmission Risk Interventions    08/08/2023    3:21 PM 11/02/2022   12:32 PM  Readmission Risk Prevention Plan  Transportation Screening  Complete  Medication Review (RN Care Manager) Complete Complete  PCP or Specialist appointment within 3-5 days of discharge Complete Complete  HRI or Home Care Consult Complete Complete  SW Recovery Care/Counseling Consult  Complete  Palliative Care Screening  Not Applicable  Skilled Nursing  Facility Complete Not Applicable

## 2023-09-26 NOTE — Plan of Care (Signed)

## 2023-09-26 NOTE — Care Management Important Message (Signed)
 Important Message  Patient Details  Name: [name] MRN: [id number] Date of Birth: 04/05/1934   Important Message Given:  Yes - Medicare IM     Janith Melnick 09/26/2023, 7:50 AM

## 2023-09-26 NOTE — Discharge Summary (Signed)
 Physician Discharge Summary   Patient: [name] MRN: [id number] DOB: October 28, 1933  Admit date:     09/21/2023  Discharge date: 09/26/23  Discharge Physician: Danette Duos   PCP: Reginal Capra, MD   Recommendations at discharge:    Needs Bmet to monitor renal function.  Needs to follow up with nephrology   Discharge Diagnoses: Principal Problem:   Acute exacerbation of CHF (congestive heart failure) (HCC)  Resolved Problems:   * No resolved hospital problems. *  Hospital Course: [name] is a 89 y.o. female with medical history significant of moderate persistent asthma, chronic combined systolic and diastolic CHF, CKD stage V, anemia of CKD, hypertension, hyperlipidemia, GERD who presented to the ED with shortness of breath.   Patient states that she has been having worsening shortness of breath over the past 2 to 3 days.  She feels that she has been wheezing as well.  She thought that her symptoms were coming from her underlying asthma as she has required hospitalization for it in the past.  She used her home inhalers without much improvement in her symptoms.  She has also noted increasing leg swelling.  Given that her shortness of breath was not improving, this prompted her to come to the ED for further evaluation.  She denies any fevers, chills, nausea, vomiting, chest pain, palpitations, worsening cough, abdominal pain, urinary changes.   [facility]: Vital signs notable for tachypnea, hypertension to the 200s/80s, new oxygen requirement of 2 L [place].  CBC showing hemoglobin 9.8 (around her baseline).  BMP showing bicarbonate 20, glucose 121, creatinine 5.9 (baseline around 5.5-5.7), BUN 65, anion gap 17.  Troponin 165.  proBNP greater than 35,000.  Chest x-ray showing chronic appearing increased interstitial lung markings with multifocal areas of airspace disease bilaterally along with small bilateral pleural effusions (CT chest recommended for further evaluation).  Patient given albuterol  nebs, IV Lasix  40 mg, DuoNebs, IV hydralazine  and IV labetalol  in the ED.  Patient transferred to [facility] for further evaluation and care.    Assessment and Plan: Acute hypoxic respiratory failure secondary to acute on chronic combined systolic and diastolic HF /Pulmonary hypertension, POA: Presented hypoxic, 87% on room air, required 2 L of oxygen, proBNP > 35,000 and chest x-ray with vascular congestion and crackles on the clinical examination.  Received a dose of IV Lasix  40 in the ED, nephrology consulted. Patient was diuresed. She has now been transition to Torsemide  BID> Satbel for discharge to rehab.    CKD Stage V/ Anion gap metabolic acidosis, POA: Patient with baseline creatinine around 5.5-5.7.  On admission, creatinine 5.9 with GFR 6.  She had metabolic acidosis.  Nephrology consulted and then sign off 09/24/2023.  She is on bicarb tablets which she may not need at the time of discharge.  Renal function at baseline.  Patient has declined hemodialysis in the past and again this time.  Avoid nephrotoxic agents.  Monitor labs closely.  Appreciate nephrology managing this. Stable. Monitor renal function on torsemide .   Hypertensive urgency Blood pressure as high as systolic 203 and diastolic 91 so far.  Her home medications resumed.  She is on carvedilol  12.5 mg twice daily, hydralazine  100 mg 3 times daily, amlodipine  10 mg daily.  Blood pressure improving.   NSTEMI ruled out/demand ischemia  EKG showing sinus rhythm.  Troponin elevated 186> 203>200.  Her troponins event previously was elevated until 154 ,9 months ago.  In the setting of no chest pain and hypertensive  urgency, suspect elevated troponin is secondary to demand ischemia.  Monitor on telemetry.   Moderate persistent asthma No wheezing on examination.  Not in exacerbation.  Continue albuterol  nebs every 6 hours as needed, breztri  twice daily   Anemia of CKD Baseline hemoglobin around 9-10.  On  admission, hemoglobin 9.8 which is down to 8.2 yesterday.  No signs of bleeding.  Will defer to nephrology for ESA.  Will transfuse if it drops to less than 7.   GERD - reported not taking protonix  since April   Hyperlipidemia - Not on any medications for this in the outpatient setting.   Generalized weakness: Seen by PT OT, recommendation for SNF, TOC working on it.   DVT prophylaxis: heparin  injection 5,000 Units Start: 09/22/23 0930   Code Status: Full Code  Family Communication:  None present at bedside.  Plan of care discussed with patient in length and he/she verbalized understanding and agreed with it.   Status is: Inpatient Remains inpatient appropriate because: Patient is medically stable, needs placement to SNF, TOC working on it.     Estimated body mass index is 27.34 kg/m as calculated from the following:   Height as of this encounter: 5\' 2"  (1.575 m).   Weight as of this encounter: 67.8 kg.   Nutritional Assessment: Body mass index is 27.34 kg/m.Aaron Aas Seen by dietician.  I agree with the assessment and plan as outlined below: Nutrition Status:   . Skin Assessment: I have examined the patient's skin and I agree with the wound assessment as performed by the wound care RN as outlined below:           Consultants: Nephrology  Procedures performed: none Disposition: Skilled nursing facility Diet recommendation:  Discharge Diet Orders (From admission, onward)     Start     Ordered   09/26/23 0000  Diet - low sodium heart healthy        09/26/23 1140           Cardiac diet DISCHARGE MEDICATION: Allergies as of 09/26/2023       Reactions   Penicillins Nausea And Vomiting   Asa [aspirin] Other (See Comments)   Wheezing Acetaminophen  is OK   Fish Allergy Itching, Nausea And Vomiting, Swelling   Swelling of the face        Medication List     STOP taking these medications    isosorbide  mononitrate 30 MG 24 hr tablet Commonly known as: IMDUR         TAKE these medications    acetaminophen  500 MG tablet Commonly known as: TYLENOL  Take 500 mg by mouth 2 (two) times daily as needed for mild pain (pain score 1-3) or headache. What changed: Another medication with the same name was added. Make sure you understand how and when to take each.   acetaminophen  325 MG tablet Commonly known as: TYLENOL  Take 2 tablets (650 mg total) by mouth every 6 (six) hours as needed for mild pain (pain score 1-3) (or Fever >/= 101). What changed: You were already taking a medication with the same name, and this prescription was added. Make sure you understand how and when to take each.   AeroChamber Plus inhaler Use as instructed   albuterol  (2.5 MG/3ML) 0.083% nebulizer solution Commonly known as: PROVENTIL  Take 3 mLs (2.5 mg total) by nebulization every 6 (six) hours as needed for shortness of breath or wheezing.   albuterol  108 (90 Base) MCG/ACT inhaler Commonly known as: VENTOLIN  HFA USE 2 PUFFS EVERY  6 HOURS AS NEEDED FOR WHEEZING.   amLODipine  10 MG tablet Commonly known as: NORVASC  Take 1 tablet (10 mg total) by mouth daily. What changed: when to take this   Breztri  Aerosphere 160-9-4.8 MCG/ACT Aero inhaler Generic drug: budeson-glycopyrrolate-formoterol  INHALE 2 PUFFS INTO THE LUNGS TWICE DAILY, IN THE MORNING & AT BEDTIME. What changed:  how much to take how to take this when to take this reasons to take this additional instructions   carvedilol  12.5 MG tablet Commonly known as: COREG  Take 1 tablet (12.5 mg total) by mouth 2 (two) times daily.   citalopram  20 MG tablet Commonly known as: CELEXA  Take 0.5 tablets (10 mg total) by mouth daily. What changed: when to take this   cloNIDine  0.1 MG tablet Commonly known as: CATAPRES  TAKE ONE TABLET BY MOUTH TWICE DAILY AS NEEDED for hypertension What changed:  how much to take how to take this when to take this reasons to take this additional instructions    cyanocobalamin  1000 MCG tablet Commonly known as: VITAMIN B12 Take 1,000 mcg by mouth daily.   ferrous sulfate  325 (65 FE) MG EC tablet Take 1 tablet (325 mg total) by mouth daily with breakfast.   fluticasone  50 MCG/ACT nasal spray Commonly known as: FLONASE  Place 2 sprays into both nostrils as needed for allergies or rhinitis.   hydrALAZINE  100 MG tablet Commonly known as: APRESOLINE  Take 1 tablet (100 mg total) by mouth 3 (three) times daily.   lidocaine  5 % Commonly known as: LIDODERM  Place 1 patch onto the skin daily. Remove & Discard patch within 12 hours or as directed by MD Start taking on: Sep 27, 2023   LORazepam  0.5 MG tablet Commonly known as: ATIVAN  Take 1 tablet (0.5 mg total) by mouth 2 (two) times daily as needed. for anxiety   montelukast  10 MG tablet Commonly known as: SINGULAIR  Take 1 tablet (10 mg total) by mouth daily. What changed: when to take this   sodium bicarbonate  650 MG tablet Take 1 tablet (650 mg total) by mouth 2 (two) times daily. What changed: how much to take   Torsemide  40 MG Tabs Take 40 mg by mouth 2 (two) times daily. What changed: when to take this   Vitamin D  (Cholecalciferol ) 25 MCG (1000 UT) Tabs Take 1,000 Units by mouth daily.        Contact information for after-discharge care     Destination     [facility] Preferred SNF .   Service: Skilled Nursing Contact information: [address] [place]  [address] [phone]                    Discharge Exam: Cleavon Curls Weights   09/23/23 1619 09/25/23 0433 09/26/23 0415  Weight: 62.8 kg 67.8 kg 67.2 kg   General; NAD  Condition at discharge: stable  The results of significant diagnostics from this hospitalization (including imaging, microbiology, ancillary and laboratory) are listed below for reference.   Imaging Studies: ECHOCARDIOGRAM COMPLETE Result Date: 09/23/2023    ECHOCARDIOGRAM REPORT   Patient  Name:   LAWANA STOESSEL [facility] Date of Exam: 09/23/2023 Medical Rec #:  [id number]          Height:       62.0 in Accession #:    [id number]         Weight:       138.4 lb Date of Birth:  29-Oct-1933           BSA:  1.635 m Patient Age:    89 years           BP:           175/63 mmHg Patient Gender: F                  HR:           68 bpm. Exam Location:  Inpatient Procedure: 2D Echo, Cardiac Doppler and Color Doppler (Both Spectral and Color            Flow Doppler were utilized during procedure). Indications:    CHF-Acute Systolic I50.21  History:        Patient has prior history of Echocardiogram examinations, most                 recent 08/08/2023. CHF; Risk Factors:Hypertension, Diabetes and                 Dyslipidemia. Hx of CKD.  Sonographer:    Denese Finn RCS Referring Phys: [id number] SAGAR H JINWALA IMPRESSIONS  1. Left ventricular ejection fraction, by estimation, is 45 to 50%. The left ventricle has mildly decreased function. The left ventricle demonstrates global hypokinesis. There is severe left ventricular hypertrophy. Left ventricular diastolic parameters  are consistent with Grade III diastolic dysfunction (restrictive). Elevated left atrial pressure.  2. Right ventricular systolic function is normal. The right ventricular size is normal. There is severely elevated pulmonary artery systolic pressure. The estimated right ventricular systolic pressure is 86.6 mmHg.  3. Left atrial size was severely dilated.  4. Right atrial size was severely dilated.  5. The mitral valve is abnormal. Thickened leaflets. Moderate mitral valve regurgitation.  6. Tricuspid valve regurgitation is mild to moderate.  7. The aortic valve is tricuspid. Aortic valve regurgitation is trivial. Aortic valve sclerosis is present, with no evidence of aortic valve stenosis.  8. The inferior vena cava is dilated in size with <50% respiratory variability, suggesting right atrial pressure of 15 mmHg. FINDINGS  Left Ventricle: Left  ventricular ejection fraction, by estimation, is 45 to 50%. The left ventricle has mildly decreased function. The left ventricle demonstrates global hypokinesis. The left ventricular internal cavity size was normal in size. There is  severe left ventricular hypertrophy. Left ventricular diastolic parameters are consistent with Grade III diastolic dysfunction (restrictive). Elevated left atrial pressure. Right Ventricle: The right ventricular size is normal. No increase in right ventricular wall thickness. Right ventricular systolic function is normal. There is severely elevated pulmonary artery systolic pressure. The tricuspid regurgitant velocity is 4.23 m/s, and with an assumed right atrial pressure of 15 mmHg, the estimated right ventricular systolic pressure is 86.6 mmHg. Left Atrium: Left atrial size was severely dilated. Right Atrium: Right atrial size was severely dilated. Pericardium: Trivial pericardial effusion is present. Mitral Valve: The mitral valve is abnormal. Moderate mitral valve regurgitation. Tricuspid Valve: The tricuspid valve is normal in structure. Tricuspid valve regurgitation is mild to moderate. Aortic Valve: The aortic valve is tricuspid. Aortic valve regurgitation is trivial. Aortic valve sclerosis is present, with no evidence of aortic valve stenosis. Pulmonic Valve: The pulmonic valve was grossly normal. Pulmonic valve regurgitation is trivial. Aorta: The aortic root is normal in size and structure. Venous: The inferior vena cava is dilated in size with less than 50% respiratory variability, suggesting right atrial pressure of 15 mmHg. IAS/Shunts: The interatrial septum was not well visualized.  LEFT VENTRICLE PLAX 2D LVIDd:         4.90 cm  Diastology LVIDs:         3.50 cm   LV e' medial:    4.16 cm/s LV PW:         1.60 cm   LV E/e' medial:  34.3 LV IVS:        1.50 cm   LV e' lateral:   4.99 cm/s LVOT diam:     2.00 cm   LV E/e' lateral: 28.7 LV SV:         74 LV SV Index:   46  LVOT Area:     3.14 cm  RIGHT VENTRICLE RV S prime:     11.50 cm/s TAPSE (M-mode): 2.4 cm LEFT ATRIUM             Index        RIGHT ATRIUM           Index LA diam:        4.50 cm 2.75 cm/m   RA Area:     24.70 cm LA Vol (A2C):   89.5 ml 54.74 ml/m  RA Volume:   87.80 ml  53.70 ml/m LA Vol (A4C):   90.6 ml 55.41 ml/m LA Biplane Vol: 90.6 ml 55.41 ml/m  AORTIC VALVE LVOT Vmax:   102.00 cm/s LVOT Vmean:  68.200 cm/s LVOT VTI:    0.237 m  AORTA Ao Root diam: 3.10 cm MITRAL VALVE                  TRICUSPID VALVE MV Area (PHT): 7.66 cm       TR Peak grad:   71.6 mmHg MV Decel Time: 99 msec        TR Vmax:        423.00 cm/s MR Peak grad:    134.6 mmHg MR Mean grad:    92.0 mmHg    SHUNTS MR Vmax:         580.00 cm/s  Systemic VTI:  0.24 m MR Vmean:        454.0 cm/s   Systemic Diam: 2.00 cm MR PISA:         3.08 cm MR PISA Eff ROA: 12 mm MR PISA Radius:  0.70 cm MV E velocity: 143.00 cm/s MV A velocity: 69.80 cm/s MV E/A ratio:  2.05 Carson Clara MD Electronically signed by Carson Clara MD Signature Date/Time: 09/23/2023/7:01:41 PM    Final    US  RENAL Result Date: 09/23/2023 CLINICAL DATA:  Chronic kidney disease. EXAM: RENAL / URINARY TRACT ULTRASOUND COMPLETE COMPARISON:  None Available. FINDINGS: Right Kidney: Renal measurements: 8.3 x 3.7 x 4.6 cm = volume: 74.4 mL. Diffuse increased parenchymal echogenicity. Left kidney cyst measures 1.7 cm. This appears anechoic with increased through transmission. No hydronephrosis. Left Kidney: Renal measurements: 9.4 x 4.1 x 4.5 cm. = volume: 90.3 ml. Normal parenchymal echogenicity. Upper pole kidney cyst measures 3.3 cm. No hydronephrosis. Bladder: Decompressed. Other: None. IMPRESSION: 1. No acute findings. No hydronephrosis. 2. Increased right renal parenchymal echogenicity compatible with chronic medical renal disease. 3. Bilateral renal cysts. Electronically Signed   By: Kimberley Penman M.D.   On: 09/23/2023 09:36   CT CHEST WO  CONTRAST Result Date: 09/22/2023 CLINICAL DATA:  Followup abnormal chest x-ray. EXAM: CT CHEST WITHOUT CONTRAST TECHNIQUE: Multidetector CT imaging of the chest was performed following the standard protocol without IV contrast. RADIATION DOSE REDUCTION: This exam was performed according to the departmental dose-optimization program which includes automated exposure control, adjustment of the mA and/or kV according  to patient size and/or use of iterative reconstruction technique. COMPARISON:  Chest x-ray 09/22/2023 and prior chest CT 08/08/2023 FINDINGS: Cardiovascular: The heart is mildly enlarged but stable. No pericardial effusion. Stable tortuosity and heavy calcification of the thoracic aorta. Stable three-vessel coronary artery calcifications. Mediastinum/Nodes: Scattered borderline mediastinal and hilar lymph nodes, unchanged. No new or progressive findings. The esophagus is grossly normal. Lungs/Pleura: Interval enlargement of bilateral pleural effusions which are moderate sized. Associated fluid in the right major fissure and some overlying atelectasis. Multifocal ground-glass opacities in both lungs suspicious for asymmetric pulmonary edema or atypical/viral pneumonia. More focal areas of dense airspace consolidation are noted in the right lower lobe and left upper lobe which could be secondary bacterial infiltrates. Rounded nodular densities are likely infiltrates. The tracheobronchial tree is grossly normal. No findings suspicious for aspiration. Underlying emphysematous changes and pulmonary scarring. Upper Abdomen: No significant upper abdominal findings. Stable left renal cyst not requiring any further imaging evaluation or follow-up. Stable vascular disease. Musculoskeletal: No breast masses, supraclavicular or axillary adenopathy. The bony thorax is intact. IMPRESSION: 1. Interval enlargement of bilateral pleural effusions which are moderate sized. 2. Multifocal ground-glass opacities in both lungs  suspicious for asymmetric pulmonary edema or atypical/viral pneumonia. 3. More focal areas of dense airspace consolidation in the right lower lobe and left upper lobe could be secondary bacterial infiltrates. 4. Underlying emphysematous changes and pulmonary scarring. 5. Stable borderline mediastinal and hilar lymph nodes, likely reactive. 6. Stable vascular disease. Aortic Atherosclerosis (ICD10-I70.0) and Emphysema (ICD10-J43.9). Electronically Signed   By: Marrian Siva M.D.   On: 09/22/2023 16:34   DG Chest Port 1 View Result Date: 09/22/2023 CLINICAL DATA:  Chest pain and shortness of breath. EXAM: PORTABLE CHEST 1 VIEW COMPARISON:  August 09, 2023 FINDINGS: The cardiac silhouette is enlarged and unchanged in size. There is marked severity calcification of the aortic arch. Chronic appearing increased interstitial lung markings are seen with multifocal areas of airspace disease seen bilaterally. There are small bilateral pleural effusions. No pneumothorax is identified. Multilevel degenerative changes are seen throughout the thoracic spine. IMPRESSION: 1. Chronic appearing increased interstitial lung markings with multifocal areas of airspace disease bilaterally. Correlation with chest CT is recommended, as multifocal infiltrates cannot be excluded. 2. Small bilateral pleural effusions. Electronically Signed   By: Virgle Grime M.D.   On: 09/22/2023 03:13   VAS US  UPPER EXTREMITY VENOUS DUPLEX Result Date: 09/19/2023 UPPER VENOUS STUDY  Patient Name:  Myrtle Boie  Date of Exam:   09/19/2023 Medical Rec #: [id number]           Accession #:    [id number] Date of Birth: 12/31/1933            Patient Gender: F Patient Age:   5 years Exam Location:  [address] Procedure:      VAS US  UPPER EXTREMITY VENOUS DUPLEX Referring Phys: Daphine Eagle --------------------------------------------------------------------------------  Indications: Swelling, and Edema of the right hand and right arm which started  about 3 weeks ago. It comes and goes. Performing Technologist: Harless Lien RVT  Examination Guidelines: A complete evaluation includes B-mode imaging, spectral Doppler, color Doppler, and power Doppler as needed of all accessible portions of each vessel. Bilateral testing is considered an integral part of a complete examination. Limited examinations for reoccurring indications may be performed as noted.  Right Findings: +----------+------------+---------+-----------+----------+-------------+ RIGHT     CompressiblePhasicitySpontaneousProperties   Summary    +----------+------------+---------+-----------+----------+-------------+ IJV           Full  Yes       Yes                            +----------+------------+---------+-----------+----------+-------------+ Subclavian               Yes       Yes                            +----------+------------+---------+-----------+----------+-------------+ Axillary      Full       Yes       Yes                            +----------+------------+---------+-----------+----------+-------------+ Brachial      Full       Yes       Yes                            +----------+------------+---------+-----------+----------+-------------+ Radial        Full                                                +----------+------------+---------+-----------+----------+-------------+ Ulnar         Full                                                +----------+------------+---------+-----------+----------+-------------+ Cephalic      Full                                  small caliber +----------+------------+---------+-----------+----------+-------------+ Basilic       Full       Yes       Yes                            +----------+------------+---------+-----------+----------+-------------+ Innominate               Yes       Yes                             +----------+------------+---------+-----------+----------+-------------+  Left Findings: +----------+------------+---------+-----------+----------+-------+ LEFT      CompressiblePhasicitySpontaneousPropertiesSummary +----------+------------+---------+-----------+----------+-------+ Subclavian               Yes       Yes                      +----------+------------+---------+-----------+----------+-------+  Summary:  Right: No evidence of deep vein thrombosis in the upper extremity. No evidence of superficial vein thrombosis in the upper extremity.  Left: No evidence of thrombosis in the subclavian.  *See table(s) above for measurements and observations.  Diagnosing physician: Angela Kell MD Electronically signed by Angela Kell MD on 09/19/2023 at 4:51:20 PM.    Final    DG Shoulder Right Result Date: 09/13/2023 CLINICAL DATA:  Right arm swelling and pain. EXAM: RIGHT SHOULDER - 2+ VIEW; RIGHT HUMERUS - 2+ VIEW COMPARISON:  Right humerus radiographs 08/08/2023 FINDINGS: Right  shoulder: Moderate severe superior glenohumeral joint space narrowing. Moderate inferior and posterior glenoid and humeral head-neck junction degenerative spurring. Moderate acromioclavicular joint space narrowing and peripheral osteophytosis. Small chronic ossicle at the superior aspect of the joint. Moderate lateral downsloping of the acromion. Right humerus: No acute fracture. Mild medial elbow peripheral degenerative spurring. Mild chronic enthesopathic change at the common flexor tendon origin at the medial epicondyle greater than the common extensor tendon origin at the lateral epicondyle of the distal humerus. IMPRESSION: 1. Moderate glenohumeral and acromioclavicular osteoarthritis. 2. Moderate lateral downsloping of the acromion. 3. Mild chronic enthesopathic change at the common flexor tendon origin at the medial epicondyle greater than the common extensor tendon origin at the lateral epicondyle of the distal humerus.  Electronically Signed   By: Bertina Broccoli M.D.   On: 09/13/2023 11:39   DG Humerus Right Result Date: 09/13/2023 CLINICAL DATA:  Right arm swelling and pain. EXAM: RIGHT SHOULDER - 2+ VIEW; RIGHT HUMERUS - 2+ VIEW COMPARISON:  Right humerus radiographs 08/08/2023 FINDINGS: Right shoulder: Moderate severe superior glenohumeral joint space narrowing. Moderate inferior and posterior glenoid and humeral head-neck junction degenerative spurring. Moderate acromioclavicular joint space narrowing and peripheral osteophytosis. Small chronic ossicle at the superior aspect of the joint. Moderate lateral downsloping of the acromion. Right humerus: No acute fracture. Mild medial elbow peripheral degenerative spurring. Mild chronic enthesopathic change at the common flexor tendon origin at the medial epicondyle greater than the common extensor tendon origin at the lateral epicondyle of the distal humerus. IMPRESSION: 1. Moderate glenohumeral and acromioclavicular osteoarthritis. 2. Moderate lateral downsloping of the acromion. 3. Mild chronic enthesopathic change at the common flexor tendon origin at the medial epicondyle greater than the common extensor tendon origin at the lateral epicondyle of the distal humerus. Electronically Signed   By: Bertina Broccoli M.D.   On: 09/13/2023 11:39   DG Forearm Right Result Date: 09/13/2023 CLINICAL DATA:  Right arm swelling. EXAM: RIGHT FOREARM - 2 VIEW COMPARISON:  Right humerus radiographs 08/08/2023 FINDINGS: Mild radial head-neck junction degenerative osteophytosis. Moderate enthesopathic change/heterotopic bone formation at the biceps tendon insertion on the greater tuberosity, similar to prior. Mild chronic enthesopathic change at the common flexor tendon origin at the medial epicondyle greater than the common extensor tendon origin at the lateral epicondyle. No acute fracture or dislocation. Mild diffuse soft tissue swelling. IMPRESSION: 1. Mild diffuse soft tissue swelling. No  acute fracture. 2. Moderate enthesopathic change/heterotopic bone formation at the biceps tendon insertion on the greater tuberosity, similar to prior. Electronically Signed   By: Bertina Broccoli M.D.   On: 09/13/2023 11:36   DG Wrist Complete Right Result Date: 09/13/2023 CLINICAL DATA:  Pain and swelling.  No known injury. EXAM: RIGHT HAND - COMPLETE 3+ VIEW; RIGHT WRIST - COMPLETE 3+ VIEW COMPARISON:  None Available. FINDINGS: Right wrist: Moderate severe thumb chronic carpal joint space narrowing, subchondral sclerosis and cystic change, and peripheral osteophytosis. Moderate triscaphe joint space narrowing and peripheral osteophytosis married mild calcification overlying the triangular fibrocartilage complex. Right hand: Moderate to severe third and moderate second metacarpophalangeal joint space narrowing. There appears to be mild to moderate third and mild second metacarpophalangeal joint volar subluxation of the proximal phalanges with respect to the metacarpal heads. Moderate second and mild third through fifth DIP joint space narrowing and peripheral osteophytosis. Mild lateral thumb interphalangeal joint space narrowing and subchondral sclerosis. Possible small erosions at the medial/ulnar aspect of the third metacarpal head. No acute fracture or dislocation. Moderate dorsal hand soft  tissue swelling, greatest at the distal metacarpals. IMPRESSION: 1. Moderate to severe thumb carpometacarpal and moderate triscaphe osteoarthritis. 2. Moderate to severe third and moderate second metacarpophalangeal joint space narrowing with mild to moderate third and mild second metacarpophalangeal joint volar subluxation of the proximal phalanges with respect to the metacarpal heads. Possible small erosions at the medial/ulnar aspect of the third metacarpal head. This is nonspecific but can be seen with rheumatoid arthritis. 3. Moderate dorsal hand soft tissue swelling, greatest at the distal metacarpals. Electronically  Signed   By: Bertina Broccoli M.D.   On: 09/13/2023 11:32   DG Hand Complete Right Result Date: 09/13/2023 CLINICAL DATA:  Pain and swelling.  No known injury. EXAM: RIGHT HAND - COMPLETE 3+ VIEW; RIGHT WRIST - COMPLETE 3+ VIEW COMPARISON:  None Available. FINDINGS: Right wrist: Moderate severe thumb chronic carpal joint space narrowing, subchondral sclerosis and cystic change, and peripheral osteophytosis. Moderate triscaphe joint space narrowing and peripheral osteophytosis married mild calcification overlying the triangular fibrocartilage complex. Right hand: Moderate to severe third and moderate second metacarpophalangeal joint space narrowing. There appears to be mild to moderate third and mild second metacarpophalangeal joint volar subluxation of the proximal phalanges with respect to the metacarpal heads. Moderate second and mild third through fifth DIP joint space narrowing and peripheral osteophytosis. Mild lateral thumb interphalangeal joint space narrowing and subchondral sclerosis. Possible small erosions at the medial/ulnar aspect of the third metacarpal head. No acute fracture or dislocation. Moderate dorsal hand soft tissue swelling, greatest at the distal metacarpals. IMPRESSION: 1. Moderate to severe thumb carpometacarpal and moderate triscaphe osteoarthritis. 2. Moderate to severe third and moderate second metacarpophalangeal joint space narrowing with mild to moderate third and mild second metacarpophalangeal joint volar subluxation of the proximal phalanges with respect to the metacarpal heads. Possible small erosions at the medial/ulnar aspect of the third metacarpal head. This is nonspecific but can be seen with rheumatoid arthritis. 3. Moderate dorsal hand soft tissue swelling, greatest at the distal metacarpals. Electronically Signed   By: Bertina Broccoli M.D.   On: 09/13/2023 11:32    Microbiology: Results for orders placed or performed during the hospital encounter of 08/07/23  MRSA Next  Gen by PCR, Nasal     Status: None   Collection Time: 08/09/23  5:29 AM   Specimen: Nasal Mucosa; Nasal Swab  Result Value Ref Range Status   MRSA by PCR Next Gen NOT DETECTED NOT DETECTED Final    Comment: (NOTE) The GeneXpert MRSA Assay (FDA approved for NASAL specimens only), is one component of a comprehensive MRSA colonization surveillance program. It is not intended to diagnose MRSA infection nor to guide or monitor treatment for MRSA infections. Test performance is not FDA approved in patients less than 39 years old. Performed at [facility], [address], [place], [place] [address]    *Note: Due to a large number of results and/or encounters for the requested time period, some results have not been displayed. A complete set of results can be found in Results Review.    Labs: CBC: Recent Labs  Lab 09/22/23 0036 09/23/23 0437 09/24/23 0326 09/26/23 1053  WBC 7.2 4.8 4.9 5.0  NEUTROABS  --   --  3.4  --   HGB 9.8* 8.2* 8.2* 8.4*  HCT 32.0* 27.3* 26.0* 27.3*  MCV 97.3 97.5 95.2 94.8  PLT 198 162 165 203   Basic Metabolic Panel: Recent Labs  Lab 09/22/23 0036 09/23/23 0437 09/24/23 0326 09/25/23 0354 09/26/23 0451  NA 142 140 139 138 137  K 4.5 4.3 4.2 4.0 3.8  CL 106 106 104 102 103  CO2 20* 20* 21* 23 23  GLUCOSE 121* 113* 106* 104* 106*  BUN 65* 68* 68* 69* 66*  CREATININE 5.90* 5.80* 6.03* 6.03* 5.96*  CALCIUM  9.3 8.5* 8.1* 8.0* 7.9*  PHOS  --  6.3*  --   --   --    Liver Function Tests: Recent Labs  Lab 09/23/23 0437  ALBUMIN  2.4*   CBG: Recent Labs  Lab 09/25/23 0755 09/25/23 1157 09/25/23 1658 09/25/23 2113 09/26/23 0754  GLUCAP 111* 128* 121* 141* 108*    Discharge time spent: greater than 30 minutes.  Signed: Danette Duos, MD [facility] 09/26/2023

## 2023-09-26 NOTE — TOC Transition Note (Signed)
 Transition of Care [facility]) - Discharge Note   Patient Details  Name: [name] MRN: [id number] Date of Birth: 1933/11/16  Transition of [facility]) CM/SW Contact:  Cosimo Diones, RN Phone Number: 09/26/2023, 2:20 PM   Clinical Narrative: Case Manager received notification that the patient has declined SNF and wants to return home. Case Manager has spoken to granddaughter [name] that is HCPOA and she is agreeable with the plan. Family is taking full responsibility of the patient since she has fluctuating orientation. Family states they will alternate care when the granddaughter goes to work. Granddaughter did not have an agency preference. Several agencies called and [facility] will accept the patient for PT/OT/SW/Aide services. SOC to begin within 24-48 hours post transition home. DME bedside commode ordered via [employer] and they will deliver to the room. [name] will transport the patient home via private vehicle. No further needs identified at this time.   Final next level of care: Home w Home Health Services Barriers to Discharge: No Barriers Identified  Patient Goals and CMS Choice Patient states their goals for this hospitalization and ongoing recovery are:: plans to return home today- declines SNF   Choice offered to / list presented to :  (Granddaughter did not have an agency preference.)  Discharge Plan and Services Additional resources added to the After Visit Summary for   In-house Referral: Clinical Social Work Discharge Planning Services: CM Consult Post Acute Care Choice: Home Health          DME Arranged: Bedside commode DME Agency: [employer] Date DME Agency Contacted: 09/26/23 Time DME Agency Contacted: 1415 Representative spoke with at DME Agency: [employer] HH Arranged: OT, PT, Nurse's Aide, Social Work [employer] Agency: [facility] Date HH Agency Contacted: 09/26/23 Time HH Agency Contacted: 1400 Representative spoke with at [facility]  Agency: Loetta Ringer  Social Drivers of Health (SDOH) Interventions SDOH Screenings   Food Insecurity: No Food Insecurity (09/23/2023)  Recent Concern: Food Insecurity - Food Insecurity Present (08/08/2023)  Housing: Low Risk  (09/23/2023)  Transportation Needs: No Transportation Needs (09/23/2023)  Utilities: Not At Risk (09/23/2023)  Alcohol Screen: Low Risk  (09/21/2023)  Depression (PHQ2-9): Low Risk  (09/21/2023)  Financial Resource Strain: Low Risk  (09/21/2023)  Physical Activity: Sufficiently Active (09/21/2023)  Social Connections: Moderately Integrated (09/23/2023)  Recent Concern: Social Connections - Moderately Isolated (08/08/2023)  Stress: No Stress Concern Present (09/21/2023)  Tobacco Use: Low Risk  (09/23/2023)  Health Literacy: Adequate Health Literacy (09/21/2023)   Readmission Risk Interventions    08/08/2023    3:21 PM 11/02/2022   12:32 PM  Readmission Risk Prevention Plan  Transportation Screening  Complete  Medication Review (RN Care Manager) Complete Complete  PCP or Specialist appointment within 3-5 days of discharge Complete Complete  HRI or Home Care Consult Complete Complete  SW Recovery Care/Counseling Consult  Complete  Palliative Care Screening  Not Applicable  Skilled Nursing Facility Complete Not Applicable

## 2023-09-27 ENCOUNTER — Telehealth: Payer: Self-pay

## 2023-09-27 NOTE — Transitions of Care (Post Inpatient/ED Visit) (Signed)
 09/27/2023  Name: [name] MRN: [id number] DOB: 1934/05/10  Today's TOC FU Call Status: Today's TOC FU Call Status:: Successful TOC FU Call Completed TOC FU Call Complete Date: 09/27/23 Patient's Name and Date of Birth confirmed.  Transition Care Management Follow-up Telephone Call Date of Discharge: 09/26/23 Discharge Facility: [facility]) Type of Discharge: Inpatient Admission Primary Inpatient Discharge Diagnosis:: Heart failure How have you been since you were released from the hospital?: Better Any questions or concerns?: No  Items Reviewed: Did you receive and understand the discharge instructions provided?: Yes Medications obtained,verified, and reconciled?: Yes (Medications Reviewed) Any new allergies since your discharge?: No Dietary orders reviewed?: Yes Type of Diet Ordered:: Low salt Do you have support at home?: Yes People in Home [RPT]: child(ren), adult, grandchild(ren) Name of Support/Comfort Primary Source: Preston Brood granddaughter lives with patient.  Other family assist during daytime hours.  Medications Reviewed Today: Medications Reviewed Today     Reviewed by Vanetta Generous, RN (Registered Nurse) on 09/27/23 at 1049  Med List Status: <None>   Medication Order Taking? Sig Documenting Provider Last Dose Status Informant  acetaminophen  (TYLENOL ) 325 MG tablet [id number] Yes Take 2 tablets (650 mg total) by mouth every 6 (six) hours as needed for mild pain (pain score 1-3) (or Fever >/= 101). Regalado, Brigido Canales, MD Taking Active   acetaminophen  (TYLENOL ) 500 MG tablet [id number] No Take 500 mg by mouth 2 (two) times daily as needed for mild pain (pain score 1-3) or headache. [provider] Unknown Active Family Member, Pharmacy Records  albuterol  (PROVENTIL ) (2.5 MG/3ML) 0.083% nebulizer solution [id number] Yes Take 3 mLs (2.5 mg total) by nebulization every 6 (six) hours as needed for shortness of breath or wheezing. Starlene Eaton, FNP  Taking Active Family Member, Pharmacy Records  albuterol  (VENTOLIN  HFA) 108 [phone] Base) MCG/ACT inhaler [id number] Yes USE 2 PUFFS EVERY 6 HOURS AS NEEDED FOR WHEEZING. Denson Flake, MD Taking Active Family Member, Pharmacy Records           Med Note Darlis Eisenmenger   Sat Sep 22, 2023 11:52 AM)    amLODipine  (NORVASC ) 10 MG tablet [id number] Yes Take 1 tablet (10 mg total) by mouth daily.  Patient taking differently: Take 10 mg by mouth at bedtime.   Maylene Spear, MD Taking Active Family Member, Pharmacy Records           Med Note Darlis Eisenmenger   Sat Sep 22, 2023 11:53 AM) Pt's granddaughter states the pt takes this medication every night. Per dispense report, LF 04/01/2023 #90, 90 DS.  Budeson-Glycopyrrol-Formoterol  (BREZTRI  AEROSPHERE) 160-9-4.8 MCG/ACT AERO [id number] Yes INHALE 2 PUFFS INTO THE LUNGS TWICE DAILY, IN THE MORNING & AT BEDTIME.  Patient taking differently: Inhale 2 puffs into the lungs 2 (two) times daily as needed (wheezing, shortness of breath).   Denson Flake, MD Taking Active Family Member, Pharmacy Records  carvedilol  (COREG ) 12.5 MG tablet [id number] Yes Take 1 tablet (12.5 mg total) by mouth 2 (two) times daily. Nahser, Lela Purple, MD Taking Active Family Member, Pharmacy Records           Med Note Darlis Eisenmenger   Sat Sep 22, 2023 11:55 AM) Pt's granddaughter insists pt is taking this medication twice daily, every day. Per dispense report, LF 04/06/23 #180, 90 DS.  citalopram  (CELEXA ) 20 MG tablet [id number] Yes Take 0.5 tablets (10 mg total) by mouth daily.  Patient taking differently: Take 10 mg by mouth at bedtime.  Panosh, Joaquim Muir, MD Taking Active Family Member, Pharmacy Records  cloNIDine  (CATAPRES ) 0.1 MG tablet [id number] Yes TAKE ONE TABLET BY MOUTH TWICE DAILY AS NEEDED for hypertension  Patient taking differently: Take 0.1 mg by mouth 2 (two) times daily as needed (SBP > 160).   Panosh, Joaquim Muir, MD Taking Active Family Member, Pharmacy Records   ferrous sulfate  325 [phone] FE) MG EC tablet [id number] Yes Take 1 tablet (325 mg total) by mouth daily with breakfast. Panosh, Joaquim Muir, MD Taking Active Family Member, Pharmacy Records  fluticasone  (FLONASE ) 50 MCG/ACT nasal spray [id number] Yes Place 2 sprays into both nostrils as needed for allergies or rhinitis. [provider] Taking Active Family Member, Pharmacy Records  hydrALAZINE  (APRESOLINE ) 100 MG tablet [id number] Yes Take 1 tablet (100 mg total) by mouth 3 (three) times daily. Nahser, Lela Purple, MD Taking Active Family Member, Pharmacy Records           Med Note Darlis Eisenmenger   Sat Sep 22, 2023 11:58 AM) Pt's granddaughter insists pt is taking this medication TID, every day. Per dispense report, LF 05/29/2023 #90, 30 DS.  lidocaine  (LIDODERM ) 5 % [id number] Yes Place 1 patch onto the skin daily. Remove & Discard patch within 12 hours or as directed by MD Danette Duos, MD Taking Active   LORazepam  (ATIVAN ) 0.5 MG tablet [id number] Yes Take 1 tablet (0.5 mg total) by mouth 2 (two) times daily as needed. for anxiety Regalado, Belkys A, MD Taking Active   montelukast  (SINGULAIR ) 10 MG tablet [id number] Yes Take 1 tablet (10 mg total) by mouth daily.  Patient taking differently: Take 10 mg by mouth at bedtime.   Denson Flake, MD Taking Active Family Member, Pharmacy Records  sodium bicarbonate  650 MG tablet [id number] Yes Take 1 tablet (650 mg total) by mouth 2 (two) times daily. Regalado, Belkys A, MD Taking Active   Spacer/Aero-Holding Chambers (AEROCHAMBER PLUS) inhaler [id number] Yes Use as instructed Ethlyn Herd, MD Taking Active Family Member, Pharmacy Records  torsemide  40 MG TABS [id number] Yes Take 40 mg by mouth 2 (two) times daily. Regalado, Belkys A, MD Taking Active   vitamin B-12 (CYANOCOBALAMIN ) 1000 MCG tablet [id number] Yes Take 1,000 mcg by mouth daily. [provider] Taking Active Family Member, Pharmacy Records  Vitamin D , Cholecalciferol , 25 MCG  (1000 UT) TABS [id number] Yes Take 1,000 Units by mouth daily. Webb, Padonda B, FNP Taking Active Family Member, Pharmacy Records            Home Care and Equipment/Supplies: Were Home Health Services Ordered?: Yes Name of Home Health Agency:: [facility] Has Agency set up a time to come to your home?: No (Patient has been home less than 24 hours. Granddaugther has phone number and will call homehealth.) Any new equipment or medical supplies ordered?: Yes Name of Medical supply agency?: [facility] Were you able to get the equipment/medical supplies?: Yes ([facility]) Do you have any questions related to the use of the equipment/supplies?: No  Functional Questionnaire: Do you need assistance with bathing/showering or dressing?: No Do you need assistance with meal preparation?: Yes (family) Do you need assistance with eating?: No Do you have difficulty maintaining continence: No Do you need assistance with getting out of bed/getting out of a chair/moving?: No Do you have difficulty managing or taking your medications?: Yes (granddaughter manages medications.)  Follow up appointments reviewed: PCP Follow-up appointment confirmed?: No (granddaughter to call) MD Provider Line Number:[phone] Given: No Specialist Hospital Follow-up appointment confirmed?: No  Reason Specialist Follow-Up Not Confirmed: Patient has Specialist Provider Number and will Call for Appointment Do you need transportation to your follow-up appointment?: No  SDOH Interventions Today    Flowsheet Row Most Recent Value  SDOH Interventions   Food Insecurity Interventions Intervention Not Indicated  Housing Interventions Intervention Not Indicated  Transportation Interventions Intervention Not Indicated  Utilities Interventions Intervention Not Indicated      Reviewed with granddaughter 30 day TOC program and granddaughter has accepted.     Goals Addressed             This Visit's Progress    [facility] Transitions of  Care (TOC) Care Plan       Problems:  Recent Hospitalization for treatment of CHF and ESRD Medication management barrier Patient has been removing some of her medications from her pill box without granddaughters knowledge and No Hospital Follow Up Provider appointment ( granddaughter will call today and schedule)  Goal:  Over the next 30 days, the patient will not experience hospital readmission  Interventions:  Transitions of Care: Encouraged daily weights   Heart Failure Interventions: Basic overview and discussion of pathophysiology of Heart Failure reviewed Provided education on low sodium diet Assessed need for readable accurate scales in home Provided education about placing scale on hard, flat surface Advised patient to weigh each morning after emptying bladder Discussed importance of daily weight and advised patient to weigh and record daily Reviewed role of diuretics in prevention of fluid overload and management of heart failure; Discussed the importance of keeping all appointments with provider Assessed social determinant of health barriers  Encouraged granddaughter to ensure that patient to taking all of her medications as directed  Patient Self Care Activities:  Attend all scheduled provider appointments Call pharmacy for medication refills 3-7 days in advance of running out of medications Call provider office for new concerns or questions  Notify RN Care Manager of TOC call rescheduling needs Participate in Transition of Care Program/Attend TOC scheduled calls Take medications as prescribed   call office if I gain more than 2 pounds in one day or 5 pounds in one week track weight in diary use salt in moderation watch for swelling in feet, ankles and legs every day weigh myself daily know when to call the doctor:weight gain or changes in condition  Plan:  Telephone follow up appointment with care management team member scheduled for:  10/04/2023        Orpha Blade, RN, BSN, CEN Population Health- Transition of Care Team.  [facility] [phone]

## 2023-10-02 DIAGNOSIS — E872 Acidosis, unspecified: Secondary | ICD-10-CM | POA: Diagnosis not present

## 2023-10-02 DIAGNOSIS — Z7951 Long term (current) use of inhaled steroids: Secondary | ICD-10-CM | POA: Diagnosis not present

## 2023-10-02 DIAGNOSIS — Z556 Problems related to health literacy: Secondary | ICD-10-CM | POA: Diagnosis not present

## 2023-10-02 DIAGNOSIS — J4540 Moderate persistent asthma, uncomplicated: Secondary | ICD-10-CM | POA: Diagnosis not present

## 2023-10-02 DIAGNOSIS — J9601 Acute respiratory failure with hypoxia: Secondary | ICD-10-CM | POA: Diagnosis not present

## 2023-10-02 DIAGNOSIS — N185 Chronic kidney disease, stage 5: Secondary | ICD-10-CM | POA: Diagnosis not present

## 2023-10-02 DIAGNOSIS — M19012 Primary osteoarthritis, left shoulder: Secondary | ICD-10-CM | POA: Diagnosis not present

## 2023-10-02 DIAGNOSIS — K449 Diaphragmatic hernia without obstruction or gangrene: Secondary | ICD-10-CM | POA: Diagnosis not present

## 2023-10-02 DIAGNOSIS — I5043 Acute on chronic combined systolic (congestive) and diastolic (congestive) heart failure: Secondary | ICD-10-CM | POA: Diagnosis not present

## 2023-10-02 DIAGNOSIS — I8390 Asymptomatic varicose veins of unspecified lower extremity: Secondary | ICD-10-CM | POA: Diagnosis not present

## 2023-10-02 DIAGNOSIS — M19011 Primary osteoarthritis, right shoulder: Secondary | ICD-10-CM | POA: Diagnosis not present

## 2023-10-02 DIAGNOSIS — E785 Hyperlipidemia, unspecified: Secondary | ICD-10-CM | POA: Diagnosis not present

## 2023-10-02 DIAGNOSIS — E1122 Type 2 diabetes mellitus with diabetic chronic kidney disease: Secondary | ICD-10-CM | POA: Diagnosis not present

## 2023-10-02 DIAGNOSIS — I272 Pulmonary hypertension, unspecified: Secondary | ICD-10-CM | POA: Diagnosis not present

## 2023-10-02 DIAGNOSIS — K219 Gastro-esophageal reflux disease without esophagitis: Secondary | ICD-10-CM | POA: Diagnosis not present

## 2023-10-02 DIAGNOSIS — D631 Anemia in chronic kidney disease: Secondary | ICD-10-CM | POA: Diagnosis not present

## 2023-10-02 DIAGNOSIS — I2489 Other forms of acute ischemic heart disease: Secondary | ICD-10-CM | POA: Diagnosis not present

## 2023-10-02 DIAGNOSIS — I132 Hypertensive heart and chronic kidney disease with heart failure and with stage 5 chronic kidney disease, or end stage renal disease: Secondary | ICD-10-CM | POA: Diagnosis not present

## 2023-10-02 DIAGNOSIS — K5790 Diverticulosis of intestine, part unspecified, without perforation or abscess without bleeding: Secondary | ICD-10-CM | POA: Diagnosis not present

## 2023-10-02 DIAGNOSIS — I160 Hypertensive urgency: Secondary | ICD-10-CM | POA: Diagnosis not present

## 2023-10-02 DIAGNOSIS — Z86718 Personal history of other venous thrombosis and embolism: Secondary | ICD-10-CM | POA: Diagnosis not present

## 2023-10-03 ENCOUNTER — Other Ambulatory Visit: Payer: Self-pay

## 2023-10-03 DIAGNOSIS — E1122 Type 2 diabetes mellitus with diabetic chronic kidney disease: Secondary | ICD-10-CM | POA: Diagnosis not present

## 2023-10-03 DIAGNOSIS — I272 Pulmonary hypertension, unspecified: Secondary | ICD-10-CM | POA: Diagnosis not present

## 2023-10-03 DIAGNOSIS — M19012 Primary osteoarthritis, left shoulder: Secondary | ICD-10-CM | POA: Diagnosis not present

## 2023-10-03 DIAGNOSIS — K5790 Diverticulosis of intestine, part unspecified, without perforation or abscess without bleeding: Secondary | ICD-10-CM | POA: Diagnosis not present

## 2023-10-03 DIAGNOSIS — I8390 Asymptomatic varicose veins of unspecified lower extremity: Secondary | ICD-10-CM | POA: Diagnosis not present

## 2023-10-03 DIAGNOSIS — Z7951 Long term (current) use of inhaled steroids: Secondary | ICD-10-CM | POA: Diagnosis not present

## 2023-10-03 DIAGNOSIS — M19011 Primary osteoarthritis, right shoulder: Secondary | ICD-10-CM | POA: Diagnosis not present

## 2023-10-03 DIAGNOSIS — J9601 Acute respiratory failure with hypoxia: Secondary | ICD-10-CM | POA: Diagnosis not present

## 2023-10-03 DIAGNOSIS — I160 Hypertensive urgency: Secondary | ICD-10-CM | POA: Diagnosis not present

## 2023-10-03 DIAGNOSIS — J4540 Moderate persistent asthma, uncomplicated: Secondary | ICD-10-CM | POA: Diagnosis not present

## 2023-10-03 DIAGNOSIS — D631 Anemia in chronic kidney disease: Secondary | ICD-10-CM | POA: Diagnosis not present

## 2023-10-03 DIAGNOSIS — E872 Acidosis, unspecified: Secondary | ICD-10-CM | POA: Diagnosis not present

## 2023-10-03 DIAGNOSIS — N185 Chronic kidney disease, stage 5: Secondary | ICD-10-CM | POA: Diagnosis not present

## 2023-10-03 DIAGNOSIS — I5043 Acute on chronic combined systolic (congestive) and diastolic (congestive) heart failure: Secondary | ICD-10-CM | POA: Diagnosis not present

## 2023-10-03 DIAGNOSIS — Z86718 Personal history of other venous thrombosis and embolism: Secondary | ICD-10-CM | POA: Diagnosis not present

## 2023-10-03 DIAGNOSIS — Z556 Problems related to health literacy: Secondary | ICD-10-CM | POA: Diagnosis not present

## 2023-10-03 DIAGNOSIS — I132 Hypertensive heart and chronic kidney disease with heart failure and with stage 5 chronic kidney disease, or end stage renal disease: Secondary | ICD-10-CM | POA: Diagnosis not present

## 2023-10-03 DIAGNOSIS — K219 Gastro-esophageal reflux disease without esophagitis: Secondary | ICD-10-CM | POA: Diagnosis not present

## 2023-10-03 DIAGNOSIS — E785 Hyperlipidemia, unspecified: Secondary | ICD-10-CM | POA: Diagnosis not present

## 2023-10-03 DIAGNOSIS — I2489 Other forms of acute ischemic heart disease: Secondary | ICD-10-CM | POA: Diagnosis not present

## 2023-10-03 DIAGNOSIS — K449 Diaphragmatic hernia without obstruction or gangrene: Secondary | ICD-10-CM | POA: Diagnosis not present

## 2023-10-03 NOTE — Transitions of Care (Post Inpatient/ED Visit) (Signed)
 Transition of Care week 2  Visit Note  10/03/2023  Name: [name] MRN: [id number]          DOB: 1933-06-10  Situation: Patient enrolled in [facility] 30-day program. Visit completed with granddaughter by telephone.   Background:   Initial Transition Care Management Follow-up Telephone Call    Past Medical History:  Diagnosis Date   Acute superficial venous thrombosis of left lower extremity 03/27/2014   concern about hx and potential extensive on exam tenderness calf and low dose lovenox  40 qd 2-4 weekselevetion and close  fu advised .    Allergy    Anemia    Anxiety    Arthritis    "shoulders" (09/09/2012)   Asthma 09/08/2012   Carotid bruit    us  2018  low risk 1 - 39%   Cataract    bil cateracts removed   CKD (chronic kidney disease) stage 5, GFR less than 15 ml/min (HCC)    as of may 2025, creat 4.5- 5.5 range   Clotting disorder (HCC)    blood clots in legs   Diabetes mellitus without complication (HCC)    "Borderline" per pt; being monitored.  no meds per pt   Diverticulosis    Dyspnea 04/27/2014   GERD (gastroesophageal reflux disease)    Headache(784.0)    "related to my high blood pressure" (09/09/2012)   Hiatal hernia    History of DVT (deep vein thrombosis)    "RLE" (09/09/2012) coumadine cant take asa so on plavix     HTN (hypertension) 09/08/2012   Hyperlipidemia    Lupus    "cured years ago" (09/09/2012)   Other dysphagia 02/11/2013   Syncope and collapse 01/21/2018   Varicose veins    Varicose veins of leg with complications 12/05/2010    Assessment:Granddaughter reports that patient is doing well. Reports at times she will not take her "mood medications"   Granddaughter would like to reduce the number of pills as patient wants to know what each pill is for daily.  Granddaughter admits that she has not been weighing patient. Reviewed the importance of daily weights and the reason.  Patient Reported Symptoms: Cognitive Cognitive Status: Unable to  Assess (spoke with Grandaughter)      Neurological Neurological Review of Symptoms: No symptoms reported    HEENT HEENT Symptoms Reported: No symptoms reported      Cardiovascular Cardiovascular Symptoms Reported: Swelling in legs or feet (grand daughter reports slight swelling) Does patient have uncontrolled Hypertension?: No    Respiratory Respiratory Symptoms Reported: No symptoms reported    Endocrine Patient reports the following symptoms related to hypoglycemia or hyperglycemia : No symptoms reported    Gastrointestinal Gastrointestinal Symptoms Reported: No symptoms reported      Genitourinary Genitourinary Symptoms Reported: No symptoms reported    Integumentary Integumentary Symptoms Reported: No symptoms reported    Musculoskeletal Musculoskelatal Symptoms Reviewed: No symptoms reported        Psychosocial Psychosocial Symptoms Reported: No symptoms reported         There were no vitals filed for this visit.  Medications Reviewed Today     Reviewed by Vanetta Generous, RN (Registered Nurse) on 10/03/23 at 1234  Med List Status: <None>   Medication Order Taking? Sig Documenting Provider Last Dose Status Informant  acetaminophen  (TYLENOL ) 325 MG tablet [id number] Yes Take 2 tablets (650 mg total) by mouth every 6 (six) hours as needed for mild pain (pain score 1-3) (or Fever >/= 101). Regalado, Brigido Canales, MD  Taking Active   acetaminophen  (TYLENOL ) 500 MG tablet [id number] No Take 500 mg by mouth 2 (two) times daily as needed for mild pain (pain score 1-3) or headache.  Patient not taking: Reported on 10/03/2023   [provider] Not Taking Active Family Member, Pharmacy Records  albuterol  (PROVENTIL ) (2.5 MG/3ML) 0.083% nebulizer solution [id number] Yes Take 3 mLs (2.5 mg total) by nebulization every 6 (six) hours as needed for shortness of breath or wheezing. Starlene Eaton, FNP Taking Active Family Member, Pharmacy Records  albuterol  (VENTOLIN  HFA) 108  [phone] Base) MCG/ACT inhaler [id number] Yes USE 2 PUFFS EVERY 6 HOURS AS NEEDED FOR WHEEZING. Denson Flake, MD Taking Active Family Member, Pharmacy Records           Med Note Darlis Eisenmenger   Sat Sep 22, 2023 11:52 AM)    amLODipine  (NORVASC ) 10 MG tablet [id number] Yes Take 1 tablet (10 mg total) by mouth daily.  Patient taking differently: Take 10 mg by mouth at bedtime.   Maylene Spear, MD Taking Active Family Member, Pharmacy Records           Med Note Darlis Eisenmenger   Sat Sep 22, 2023 11:53 AM) Pt's granddaughter states the pt takes this medication every night. Per dispense report, LF 04/01/2023 #90, 90 DS.  Budeson-Glycopyrrol-Formoterol  (BREZTRI  AEROSPHERE) 160-9-4.8 MCG/ACT AERO [id number] Yes INHALE 2 PUFFS INTO THE LUNGS TWICE DAILY, IN THE MORNING & AT BEDTIME.  Patient taking differently: Inhale 2 puffs into the lungs 2 (two) times daily as needed (wheezing, shortness of breath).   Denson Flake, MD Taking Active Family Member, Pharmacy Records  carvedilol  (COREG ) 12.5 MG tablet [id number] Yes Take 1 tablet (12.5 mg total) by mouth 2 (two) times daily. Nahser, Lela Purple, MD Taking Active Family Member, Pharmacy Records           Med Note Darlis Eisenmenger   Sat Sep 22, 2023 11:55 AM) Pt's granddaughter insists pt is taking this medication twice daily, every day. Per dispense report, LF 04/06/23 #180, 90 DS.  citalopram  (CELEXA ) 20 MG tablet [id number] Yes Take 0.5 tablets (10 mg total) by mouth daily.  Patient taking differently: Take 10 mg by mouth at bedtime.   Panosh, Joaquim Muir, MD Taking Active Family Member, Pharmacy Records  cloNIDine  (CATAPRES ) 0.1 MG tablet [id number] Yes TAKE ONE TABLET BY MOUTH TWICE DAILY AS NEEDED for hypertension  Patient taking differently: Take 0.1 mg by mouth 2 (two) times daily as needed (SBP > 160).   Panosh, Joaquim Muir, MD Taking Active Family Member, Pharmacy Records  ferrous sulfate  325 [phone] FE) MG EC tablet [id number] Yes Take 1 tablet (325 mg  total) by mouth daily with breakfast. Panosh, Joaquim Muir, MD Taking Active Family Member, Pharmacy Records  fluticasone  (FLONASE ) 50 MCG/ACT nasal spray [id number] Yes Place 2 sprays into both nostrils as needed for allergies or rhinitis. [provider] Taking Active Family Member, Pharmacy Records  hydrALAZINE  (APRESOLINE ) 100 MG tablet [id number] Yes Take 1 tablet (100 mg total) by mouth 3 (three) times daily. Nahser, Lela Purple, MD Taking Active Family Member, Pharmacy Records           Med Note Darlis Eisenmenger   Sat Sep 22, 2023 11:58 AM) Pt's granddaughter insists pt is taking this medication TID, every day. Per dispense report, LF 05/29/2023 #90, 30 DS.  lidocaine  (LIDODERM ) 5 % [id number] Yes Place 1 patch onto the skin daily. Remove & Discard patch within 12 hours or  as directed by MD Danette Duos, MD Taking Active   LORazepam  (ATIVAN ) 0.5 MG tablet [id number] Yes Take 1 tablet (0.5 mg total) by mouth 2 (two) times daily as needed. for anxiety Regalado, Belkys A, MD Taking Active   montelukast  (SINGULAIR ) 10 MG tablet [id number] Yes Take 1 tablet (10 mg total) by mouth daily.  Patient taking differently: Take 10 mg by mouth at bedtime.   Denson Flake, MD Taking Active Family Member, Pharmacy Records  sodium bicarbonate  650 MG tablet [id number] Yes Take 1 tablet (650 mg total) by mouth 2 (two) times daily. Regalado, Belkys A, MD Taking Active   Spacer/Aero-Holding Chambers (AEROCHAMBER PLUS) inhaler [id number] Yes Use as instructed Ethlyn Herd, MD Taking Active Family Member, Pharmacy Records  torsemide  40 MG TABS [id number] Yes Take 40 mg by mouth 2 (two) times daily. Regalado, Belkys A, MD Taking Active            Med Note (ROSE, Junnie Olives   Wed Oct 03, 2023 12:34 PM) Takes 20mg  tablets.   2 tablets twice a day  vitamin B-12 (CYANOCOBALAMIN ) 1000 MCG tablet [id number] Yes Take 1,000 mcg by mouth daily. [provider] Taking Active Family Member, Pharmacy Records  Vitamin  D, Cholecalciferol , 25 MCG (1000 UT) TABS [id number] Yes Take 1,000 Units by mouth daily. Webb, Padonda B, FNP Taking Active Family Member, Pharmacy Records            Recommendation:   PCP Follow-up as directed  Follow Up Plan:   Telephone follow up appointment date/time:  10/10/2023  Orpha Blade, RN, BSN, CEN Population Health- Transition of Care Team.  [facility] [phone]

## 2023-10-04 ENCOUNTER — Telehealth: Payer: Self-pay | Admitting: *Deleted

## 2023-10-04 ENCOUNTER — Telehealth: Payer: Self-pay

## 2023-10-04 NOTE — Telephone Encounter (Signed)
 Copied from CRM [id number]. Topic: Clinical - Home Health Verbal Orders >> Oct 04, 2023  8:59 AM Clydene Darner H wrote: Caller/Agency: [facility] [place] Number: [phone] Service Requested: Physical Therapy Frequency: 1 week 9  Any new concerns about the patient? Yes Patient was hospitalized for Acute exacerbation of CHF with a lot of medications changes.

## 2023-10-09 ENCOUNTER — Other Ambulatory Visit: Payer: Self-pay | Admitting: Nurse Practitioner

## 2023-10-09 ENCOUNTER — Telehealth: Payer: Self-pay

## 2023-10-09 ENCOUNTER — Inpatient Hospital Stay: Attending: Nurse Practitioner

## 2023-10-09 ENCOUNTER — Telehealth: Payer: Self-pay | Admitting: Nurse Practitioner

## 2023-10-09 ENCOUNTER — Inpatient Hospital Stay

## 2023-10-09 VITALS — BP 217/71 | HR 68 | Temp 97.8°F | Resp 16

## 2023-10-09 DIAGNOSIS — N185 Chronic kidney disease, stage 5: Secondary | ICD-10-CM | POA: Diagnosis not present

## 2023-10-09 DIAGNOSIS — I120 Hypertensive chronic kidney disease with stage 5 chronic kidney disease or end stage renal disease: Secondary | ICD-10-CM | POA: Diagnosis not present

## 2023-10-09 DIAGNOSIS — I10 Essential (primary) hypertension: Secondary | ICD-10-CM

## 2023-10-09 DIAGNOSIS — D638 Anemia in other chronic diseases classified elsewhere: Secondary | ICD-10-CM

## 2023-10-09 DIAGNOSIS — D631 Anemia in chronic kidney disease: Secondary | ICD-10-CM | POA: Insufficient documentation

## 2023-10-09 DIAGNOSIS — Z79899 Other long term (current) drug therapy: Secondary | ICD-10-CM | POA: Diagnosis not present

## 2023-10-09 DIAGNOSIS — N183 Chronic kidney disease, stage 3 unspecified: Secondary | ICD-10-CM

## 2023-10-09 LAB — CBC WITH DIFFERENTIAL (CANCER CENTER ONLY)
Abs Immature Granulocytes: 0.04 10*3/uL (ref 0.00–0.07)
Basophils Absolute: 0 10*3/uL (ref 0.0–0.1)
Basophils Relative: 1 %
Eosinophils Absolute: 0.1 10*3/uL (ref 0.0–0.5)
Eosinophils Relative: 1 %
HCT: 33.1 % — ABNORMAL LOW (ref 36.0–46.0)
Hemoglobin: 10.3 g/dL — ABNORMAL LOW (ref 12.0–15.0)
Immature Granulocytes: 1 %
Lymphocytes Relative: 25 %
Lymphs Abs: 1.1 10*3/uL (ref 0.7–4.0)
MCH: 29.3 pg (ref 26.0–34.0)
MCHC: 31.1 g/dL (ref 30.0–36.0)
MCV: 94.3 fL (ref 80.0–100.0)
Monocytes Absolute: 0.3 10*3/uL (ref 0.1–1.0)
Monocytes Relative: 6 %
Neutro Abs: 2.8 10*3/uL (ref 1.7–7.7)
Neutrophils Relative %: 66 %
Platelet Count: 291 10*3/uL (ref 150–400)
RBC: 3.51 MIL/uL — ABNORMAL LOW (ref 3.87–5.11)
RDW: 16.1 % — ABNORMAL HIGH (ref 11.5–15.5)
WBC Count: 4.3 10*3/uL (ref 4.0–10.5)
nRBC: 0 % (ref 0.0–0.2)

## 2023-10-09 LAB — SAMPLE TO BLOOD BANK

## 2023-10-09 LAB — FERRITIN: Ferritin: 403 ng/mL — ABNORMAL HIGH (ref 11–307)

## 2023-10-09 MED ORDER — CLONIDINE HCL 0.1 MG PO TABS
0.1000 mg | ORAL_TABLET | Freq: Once | ORAL | Status: AC
  Administered 2023-10-09: 0.1 mg via ORAL
  Filled 2023-10-09: qty 1

## 2023-10-09 MED ORDER — DARBEPOETIN ALFA 100 MCG/0.5ML IJ SOSY: 100.0000 ug | PREFILLED_SYRINGE | Freq: Once | INTRAMUSCULAR | Status: DC

## 2023-10-09 NOTE — Progress Notes (Signed)
 Updated treatment parameters stating ok to administer aranesp  if SBP< 180 and DBP < 100.  Rande Bushy, NP

## 2023-10-09 NOTE — Telephone Encounter (Signed)
 Spoke to [place]. Please see other phone encounter.

## 2023-10-09 NOTE — Progress Notes (Signed)
 Per Dr. Maryalice Smaller "Ok to hold Aranesp  for today, please reschedule to next week. I will add treatment para in her order" as pt is hypertensive to the 190s-200s.  Camar Guyton, PharmD, MBA

## 2023-10-09 NOTE — Telephone Encounter (Unsigned)
 Copied from CRM [id number]. Topic: General - Other >> Oct 09, 2023  9:31 AM Adonis Hoot wrote: Reason for CRM: Erin from [facility] called in regarding orders from 5/22/205.I relayed message from provider of orders being approved.

## 2023-10-09 NOTE — Telephone Encounter (Signed)
 Spoke to [place] and inform her of provider's message. Erin with Home Health received the message.   No further action is needed at this time.

## 2023-10-09 NOTE — Telephone Encounter (Signed)
 Patient presented to infusion suite flush room for administration of aranesp  injection. Blood pressure moderately elevated on multiple checks. When asked, patient reported taking two blood pressure medications this morning, but unsure which ones. Upon assessment, patient completely asymptomatic. Will give clonidine  0.1 mg po once and recheck blood pressure in approximately 30 minutes. Ok to administer the injection if blood pressure is SBP < 180 and DBP < 100.  Rande Bushy, NP

## 2023-10-10 ENCOUNTER — Telehealth: Payer: Self-pay

## 2023-10-12 DIAGNOSIS — E872 Acidosis, unspecified: Secondary | ICD-10-CM | POA: Diagnosis not present

## 2023-10-12 DIAGNOSIS — M19012 Primary osteoarthritis, left shoulder: Secondary | ICD-10-CM | POA: Diagnosis not present

## 2023-10-12 DIAGNOSIS — I8390 Asymptomatic varicose veins of unspecified lower extremity: Secondary | ICD-10-CM | POA: Diagnosis not present

## 2023-10-12 DIAGNOSIS — Z7951 Long term (current) use of inhaled steroids: Secondary | ICD-10-CM | POA: Diagnosis not present

## 2023-10-12 DIAGNOSIS — K449 Diaphragmatic hernia without obstruction or gangrene: Secondary | ICD-10-CM | POA: Diagnosis not present

## 2023-10-12 DIAGNOSIS — Z86718 Personal history of other venous thrombosis and embolism: Secondary | ICD-10-CM | POA: Diagnosis not present

## 2023-10-12 DIAGNOSIS — K219 Gastro-esophageal reflux disease without esophagitis: Secondary | ICD-10-CM | POA: Diagnosis not present

## 2023-10-12 DIAGNOSIS — I160 Hypertensive urgency: Secondary | ICD-10-CM | POA: Diagnosis not present

## 2023-10-12 DIAGNOSIS — D631 Anemia in chronic kidney disease: Secondary | ICD-10-CM | POA: Diagnosis not present

## 2023-10-12 DIAGNOSIS — N185 Chronic kidney disease, stage 5: Secondary | ICD-10-CM | POA: Diagnosis not present

## 2023-10-12 DIAGNOSIS — E785 Hyperlipidemia, unspecified: Secondary | ICD-10-CM | POA: Diagnosis not present

## 2023-10-12 DIAGNOSIS — K5790 Diverticulosis of intestine, part unspecified, without perforation or abscess without bleeding: Secondary | ICD-10-CM | POA: Diagnosis not present

## 2023-10-12 DIAGNOSIS — I5043 Acute on chronic combined systolic (congestive) and diastolic (congestive) heart failure: Secondary | ICD-10-CM | POA: Diagnosis not present

## 2023-10-12 DIAGNOSIS — Z556 Problems related to health literacy: Secondary | ICD-10-CM | POA: Diagnosis not present

## 2023-10-12 DIAGNOSIS — J9601 Acute respiratory failure with hypoxia: Secondary | ICD-10-CM | POA: Diagnosis not present

## 2023-10-12 DIAGNOSIS — E1122 Type 2 diabetes mellitus with diabetic chronic kidney disease: Secondary | ICD-10-CM | POA: Diagnosis not present

## 2023-10-12 DIAGNOSIS — I2489 Other forms of acute ischemic heart disease: Secondary | ICD-10-CM | POA: Diagnosis not present

## 2023-10-12 DIAGNOSIS — M19011 Primary osteoarthritis, right shoulder: Secondary | ICD-10-CM | POA: Diagnosis not present

## 2023-10-12 DIAGNOSIS — J4540 Moderate persistent asthma, uncomplicated: Secondary | ICD-10-CM | POA: Diagnosis not present

## 2023-10-12 DIAGNOSIS — I272 Pulmonary hypertension, unspecified: Secondary | ICD-10-CM | POA: Diagnosis not present

## 2023-10-12 DIAGNOSIS — I132 Hypertensive heart and chronic kidney disease with heart failure and with stage 5 chronic kidney disease, or end stage renal disease: Secondary | ICD-10-CM | POA: Diagnosis not present

## 2023-10-13 ENCOUNTER — Ambulatory Visit (HOSPITAL_COMMUNITY)
Admission: EM | Admit: 2023-10-13 | Discharge: 2023-10-13 | Disposition: A | Discharge status: Home / Self Care | Attending: Physician Assistant | Admitting: Physician Assistant

## 2023-10-13 ENCOUNTER — Encounter (HOSPITAL_COMMUNITY): Payer: Self-pay | Admitting: *Deleted

## 2023-10-13 DIAGNOSIS — B029 Zoster without complications: Secondary | ICD-10-CM

## 2023-10-13 MED ORDER — VALACYCLOVIR HCL 1 G PO TABS
1000.0000 mg | ORAL_TABLET | Freq: Three times a day (TID) | ORAL | 0 refills | Status: DC
Start: 2023-10-13 — End: 2023-10-24

## 2023-10-13 NOTE — ED Triage Notes (Signed)
 Pt states she has had a rash on her neck and chest since last night. She used hydrocortisone and benadryl 

## 2023-10-13 NOTE — Discharge Instructions (Signed)
 Take valacyclovir as prescribed Follow up with primary care physician

## 2023-10-13 NOTE — ED Provider Notes (Signed)
 [facility]    CSN: [id number] Arrival date & time: 10/13/23  1526      History   Chief Complaint Chief Complaint  Patient presents with   Rash    HPI [name] is a 89 y.o. female.   Patient presents with a painful burning rash that started last night to her right neck and chest.  She has tried hydrocortisone cream and Benadryl  with minimal relief.  Rash is no other place in her body.    Past Medical History:  Diagnosis Date   Acute superficial venous thrombosis of left lower extremity 03/27/2014   concern about hx and potential extensive on exam tenderness calf and low dose lovenox  40 qd 2-4 weekselevetion and close  fu advised .    Allergy    Anemia    Anxiety    Arthritis    "shoulders" (09/09/2012)   Asthma 09/08/2012   Carotid bruit    us  2018  low risk 1 - 39%   Cataract    bil cateracts removed   CKD (chronic kidney disease) stage 5, GFR less than 15 ml/min (HCC)    as of may 2025, creat 4.5- 5.5 range   Clotting disorder (HCC)    blood clots in legs   Diabetes mellitus without complication (HCC)    "Borderline" per pt; being monitored.  no meds per pt   Diverticulosis    Dyspnea 04/27/2014   GERD (gastroesophageal reflux disease)    Headache(784.0)    "related to my high blood pressure" (09/09/2012)   Hiatal hernia    History of DVT (deep vein thrombosis)    "RLE" (09/09/2012) coumadine cant take asa so on plavix     HTN (hypertension) 09/08/2012   Hyperlipidemia    Lupus    "cured years ago" (09/09/2012)   Other dysphagia 02/11/2013   Syncope and collapse 01/21/2018   Varicose veins    Varicose veins of leg with complications 12/05/2010    Patient Active Problem List   Diagnosis Date Noted   Acute exacerbation of CHF (congestive heart failure) (HCC) 09/22/2023   Acute kidney injury superimposed on stage 5 chronic kidney disease, not on chronic dialysis (HCC) 08/08/2023   Hypertensive nephrosclerosis 08/08/2023   Anemia of  chronic kidney failure, stage 5 (HCC) 08/08/2023   Metabolic acidosis 08/08/2023   Acute exacerbation of moderate persistent extrinsic asthma 08/08/2023   Acute on chronic heart failure with preserved ejection fraction (HFpEF) (HCC) 08/08/2023   AKI (acute kidney injury) (HCC) 08/08/2023   CAP (community acquired pneumonia) 10/31/2022   Hypertensive urgency 10/31/2022   Moderate persistent asthma 10/31/2022   Hypomagnesemia 10/31/2022   CKD (chronic kidney disease), stage IV (HCC) 10/31/2022   Pulmonary hypertension, unspecified (HCC) 12/06/2021   Chronic cough 10/18/2021   Chronic rhinitis 11/24/2019   Nausea 11/03/2018   Elevated troponin 11/03/2018   Hyponatremia 11/03/2018   Hypertensive heart disease with hypertensive chronic kidney disease 11/30/2017   Fasting hyperglycemia 11/30/2017   Renal cyst 03/27/2016   CKD (chronic kidney disease) stage 3, GFR 30-59 ml/min (HCC) 08/23/2015   Subjective hearing change 06/29/2014   Resistant hypertension 06/29/2014   Lumbago 06/15/2014   Pre-diabetes vs early DM  06/15/2014   Dyspnea 04/27/2014   Asthma, chronic 04/13/2014   Elevated uric acid in blood 04/13/2014   Essential hypertension 03/27/2014   History of DVT (deep vein thrombosis)    Palpitations 01/05/2014   Anemia of chronic disease 2014-01-14   Death of family member 09-18-13  Leg edema 06/20/2013   Bereavement due to life event 04/21/2013   Other dysphagia 02/11/2013   Colon cancer screening 02/11/2013   Anxiety state 01/20/2013   Leg cramps 01/20/2013   Back pain 12/05/2012   Family hx of colon cancer 10/21/2012   Family hx of lung cancer 10/21/2012   Family hx-breast malignancy 10/21/2012   Renal insufficiency creatinine 1.3 10/21/2012   Anemia, chronic disease 10/21/2012   GERD (gastroesophageal reflux disease) 09/08/2012   HTN (hypertension) 09/08/2012   Hyperlipidemia 09/08/2012   Varicose veins of leg with complications 12/05/2010    Past Surgical  History:  Procedure Laterality Date   ABDOMINAL HYSTERECTOMY     partial   BREAST EXCISIONAL BIOPSY Right    BREAST SURGERY     CARPAL TUNNEL RELEASE Right 2000's   CARPAL TUNNEL RELEASE Bilateral 10/21/2013   Procedure: BILATERAL  CARPAL TUNNEL RELEASE;  Surgeon: Kemp Patter, MD;  Location: [facility];  Service: Orthopedics;  Laterality: Bilateral;   COLONOSCOPY     SHOULDER OPEN ROTATOR CUFF REPAIR Bilateral 2000's   TRIGGER FINGER RELEASE Right 10/21/2013   Procedure: RELEASE TRIGGER FINGER/A-1 PULLEY RIGHT MIDDLE AND RIGHT RING;  Surgeon: Kemp Patter, MD;  Location: [facility];  Service: Orthopedics;  Laterality: Right;   UPPER GASTROINTESTINAL ENDOSCOPY      OB History     Gravida  7   Para  7   Term      Preterm      AB      Living         SAB      IAB      Ectopic      Multiple      Live Births           Obstetric Comments  Lost 2 children soon after birth          Home Medications    Prior to Admission medications   Medication Sig Start Date End Date Taking? Authorizing Provider  amLODipine  (NORVASC ) 10 MG tablet Take 1 tablet (10 mg total) by mouth daily. Patient taking differently: Take 10 mg by mouth at bedtime. 11/07/22  Yes Maylene Spear, MD  Budeson-Glycopyrrol-Formoterol  (BREZTRI  AEROSPHERE) 160-9-4.8 MCG/ACT AERO INHALE 2 PUFFS INTO THE LUNGS TWICE DAILY, IN THE MORNING & AT BEDTIME. Patient taking differently: Inhale 2 puffs into the lungs 2 (two) times daily as needed (wheezing, shortness of breath). 04/19/23  Yes Denson Flake, MD  carvedilol  (COREG ) 12.5 MG tablet Take 1 tablet (12.5 mg total) by mouth 2 (two) times daily. 04/06/23  Yes Nahser, Lela Purple, MD  citalopram  (CELEXA ) 20 MG tablet Take 0.5 tablets (10 mg total) by mouth daily. Patient taking differently: Take 10 mg by mouth at bedtime. 08/07/23  Yes Panosh, Joaquim Muir, MD  ferrous sulfate  325 (65 FE) MG EC tablet Take 1 tablet (325 mg total) by  mouth daily with breakfast. 12/15/14  Yes Panosh, Wanda K, MD  hydrALAZINE  (APRESOLINE ) 100 MG tablet Take 1 tablet (100 mg total) by mouth 3 (three) times daily. 05/21/23  Yes Nahser, Lela Purple, MD  montelukast  (SINGULAIR ) 10 MG tablet Take 1 tablet (10 mg total) by mouth daily. Patient taking differently: Take 10 mg by mouth at bedtime. 04/19/23  Yes Denson Flake, MD  sodium bicarbonate  650 MG tablet Take 1 tablet (650 mg total) by mouth 2 (two) times daily. 09/26/23  Yes Regalado, Belkys A, MD  torsemide  40 MG TABS Take 40  mg by mouth 2 (two) times daily. 09/26/23  Yes Regalado, Belkys A, MD  valACYclovir (VALTREX) 1000 MG tablet Take 1 tablet (1,000 mg total) by mouth 3 (three) times daily. 10/13/23  Yes Ward, Char Common, PA-C  vitamin B-12 (CYANOCOBALAMIN ) 1000 MCG tablet Take 1,000 mcg by mouth daily.   Yes [provider]  Vitamin D , Cholecalciferol , 25 MCG (1000 UT) TABS Take 1,000 Units by mouth daily. 05/30/23  Yes Webb, Padonda B, FNP  acetaminophen  (TYLENOL ) 325 MG tablet Take 2 tablets (650 mg total) by mouth every 6 (six) hours as needed for mild pain (pain score 1-3) (or Fever >/= 101). 09/26/23   Regalado, Belkys A, MD  acetaminophen  (TYLENOL ) 500 MG tablet Take 500 mg by mouth 2 (two) times daily as needed for mild pain (pain score 1-3) or headache. Patient not taking: Reported on 10/03/2023    [provider]  albuterol  (PROVENTIL ) (2.5 MG/3ML) 0.083% nebulizer solution Take 3 mLs (2.5 mg total) by nebulization every 6 (six) hours as needed for shortness of breath or wheezing. 08/03/22   Starlene Eaton, FNP  albuterol  (VENTOLIN  HFA) 108 (90 Base) MCG/ACT inhaler USE 2 PUFFS EVERY 6 HOURS AS NEEDED FOR WHEEZING. 04/19/23   Byrum, Delora Ferry, MD  cloNIDine  (CATAPRES ) 0.1 MG tablet TAKE ONE TABLET BY MOUTH TWICE DAILY AS NEEDED for hypertension Patient taking differently: Take 0.1 mg by mouth 2 (two) times daily as needed (SBP > 160). 08/07/23   Panosh, Wanda K, MD   fluticasone  (FLONASE ) 50 MCG/ACT nasal spray Place 2 sprays into both nostrils as needed for allergies or rhinitis.    [provider]  lidocaine  (LIDODERM ) 5 % Place 1 patch onto the skin daily. Remove & Discard patch within 12 hours or as directed by MD 09/27/23   Regalado, Clifford Dam A, MD  LORazepam  (ATIVAN ) 0.5 MG tablet Take 1 tablet (0.5 mg total) by mouth 2 (two) times daily as needed. for anxiety 09/26/23   Regalado, Brigido Canales, MD  Spacer/Aero-Holding Chambers (AEROCHAMBER PLUS) inhaler Use as instructed 05/26/16   Ethlyn Herd, MD    Family History Family History  Problem Relation Age of Onset   Hypertension Mother    Lung cancer Mother    Hypertension Father    Lung cancer Father    Breast cancer Sister    Colon cancer Sister        ? 49' s dx - died in 60's   Breast cancer Sister    Hypertension Brother    Rectal cancer Brother    Stomach cancer Brother    Breast cancer Brother    Heart attack Daughter    Esophageal cancer Daughter    Hypertension Daughter    Diabetes Daughter    Breast cancer Paternal Aunt    Lung cancer Other        both parents    Pancreatic cancer Neg Hx     Social History Social History   Tobacco Use   Smoking status: Never   Smokeless tobacco: Never  Vaping Use   Vaping status: Never Used  Substance Use Topics   Alcohol use: No   Drug use: No     Allergies   Penicillins, Asa [aspirin], and Fish allergy   Review of Systems Review of Systems  Constitutional:  Negative for chills and fever.  HENT:  Negative for ear pain and sore throat.   Eyes:  Negative for pain and visual disturbance.  Respiratory:  Negative for cough and shortness of  breath.   Cardiovascular:  Negative for chest pain and palpitations.  Gastrointestinal:  Negative for abdominal pain and vomiting.  Genitourinary:  Negative for dysuria and hematuria.  Musculoskeletal:  Negative for arthralgias and back pain.  Skin:  Positive for rash. Negative for  color change.  Neurological:  Negative for seizures and syncope.  All other systems reviewed and are negative.    Physical Exam Triage Vital Signs ED Triage Vitals  Encounter Vitals Group     BP 10/13/23 1549 (!) 183/72     Systolic BP Percentile --      Diastolic BP Percentile --      Pulse Rate 10/13/23 1549 60     Resp 10/13/23 1549 18     Temp 10/13/23 1549 97.9 F (36.6 C)     Temp Source 10/13/23 1549 Oral     SpO2 10/13/23 1549 97 %     Weight --      Height --      Head Circumference --      Peak Flow --      Pain Score 10/13/23 1546 10     Pain Loc --      Pain Education --      Exclude from Growth Chart --    No data found.  Updated Vital Signs BP (!) 183/72 (BP Location: Right Arm)   Pulse 60   Temp 97.9 F (36.6 C) (Oral)   Resp 18   SpO2 97%   Visual Acuity Right Eye Distance:   Left Eye Distance:   Bilateral Distance:    Right Eye Near:   Left Eye Near:    Bilateral Near:     Physical Exam Vitals and nursing note reviewed.  Constitutional:      General: She is not in acute distress.    Appearance: She is well-developed.  HENT:     Head: Normocephalic and atraumatic.  Eyes:     Conjunctiva/sclera: Conjunctivae normal.  Neck:   Cardiovascular:     Rate and Rhythm: Normal rate and regular rhythm.     Heart sounds: No murmur heard. Pulmonary:     Effort: Pulmonary effort is normal. No respiratory distress.     Breath sounds: Normal breath sounds.  Abdominal:     Palpations: Abdomen is soft.     Tenderness: There is no abdominal tenderness.  Musculoskeletal:        General: No swelling.     Cervical back: Neck supple.  Skin:    General: Skin is warm and dry.     Capillary Refill: Capillary refill takes less than 2 seconds.  Neurological:     Mental Status: She is alert.  Psychiatric:        Mood and Affect: Mood normal.      UC Treatments / Results  Labs (all labs ordered are listed, but only abnormal results are  displayed) Labs Reviewed - No data to display  EKG   Radiology No results found.  Procedures Procedures (including critical care time)  Medications Ordered in UC Medications - No data to display  Initial Impression / Assessment and Plan / UC Course  I have reviewed the triage vital signs and the nursing notes.  Pertinent labs & imaging results that were available during my care of the patient were reviewed by me and considered in my medical decision making (see chart for details).     Rash consistent with shingles, will send in Valtrex.  Advise follow-up with primary care physician.  Final Clinical Impressions(s) / UC Diagnoses   Final diagnoses:  Herpes zoster without complication     Discharge Instructions      Take valacyclovir as prescribed Follow up with primary care physician   ED Prescriptions     Medication Sig Dispense Auth. Provider   valACYclovir (VALTREX) 1000 MG tablet Take 1 tablet (1,000 mg total) by mouth 3 (three) times daily. 21 tablet Ward, Kashis Penley Z, PA-C      PDMP not reviewed this encounter.   Ward, Char Common, PA-C 10/13/23 1620

## 2023-10-14 ENCOUNTER — Encounter (HOSPITAL_COMMUNITY): Payer: Self-pay | Admitting: Emergency Medicine

## 2023-10-14 ENCOUNTER — Inpatient Hospital Stay (HOSPITAL_COMMUNITY)
Admission: EM | Admit: 2023-10-14 | Discharge: 2023-10-24 | DRG: 871 | Disposition: A | Discharge status: Skilled Nursing Facility | Attending: Internal Medicine | Admitting: Internal Medicine

## 2023-10-14 ENCOUNTER — Emergency Department (HOSPITAL_COMMUNITY)

## 2023-10-14 ENCOUNTER — Other Ambulatory Visit: Payer: Self-pay

## 2023-10-14 DIAGNOSIS — B028 Zoster with other complications: Secondary | ICD-10-CM

## 2023-10-14 DIAGNOSIS — I5032 Chronic diastolic (congestive) heart failure: Secondary | ICD-10-CM | POA: Diagnosis not present

## 2023-10-14 DIAGNOSIS — T375X1A Poisoning by antiviral drugs, accidental (unintentional), initial encounter: Secondary | ICD-10-CM | POA: Diagnosis not present

## 2023-10-14 DIAGNOSIS — E8809 Other disorders of plasma-protein metabolism, not elsewhere classified: Secondary | ICD-10-CM | POA: Diagnosis not present

## 2023-10-14 DIAGNOSIS — B954 Other streptococcus as the cause of diseases classified elsewhere: Secondary | ICD-10-CM | POA: Diagnosis not present

## 2023-10-14 DIAGNOSIS — Z880 Allergy status to penicillin: Secondary | ICD-10-CM | POA: Diagnosis not present

## 2023-10-14 DIAGNOSIS — T361X5A Adverse effect of cephalosporins and other beta-lactam antibiotics, initial encounter: Secondary | ICD-10-CM | POA: Diagnosis present

## 2023-10-14 DIAGNOSIS — G253 Myoclonus: Secondary | ICD-10-CM | POA: Diagnosis present

## 2023-10-14 DIAGNOSIS — E1122 Type 2 diabetes mellitus with diabetic chronic kidney disease: Secondary | ICD-10-CM | POA: Diagnosis present

## 2023-10-14 DIAGNOSIS — A419 Sepsis, unspecified organism: Secondary | ICD-10-CM | POA: Diagnosis not present

## 2023-10-14 DIAGNOSIS — Y92009 Unspecified place in unspecified non-institutional (private) residence as the place of occurrence of the external cause: Secondary | ICD-10-CM

## 2023-10-14 DIAGNOSIS — B029 Zoster without complications: Secondary | ICD-10-CM | POA: Diagnosis present

## 2023-10-14 DIAGNOSIS — Z803 Family history of malignant neoplasm of breast: Secondary | ICD-10-CM

## 2023-10-14 DIAGNOSIS — N179 Acute kidney failure, unspecified: Secondary | ICD-10-CM | POA: Diagnosis present

## 2023-10-14 DIAGNOSIS — Z8249 Family history of ischemic heart disease and other diseases of the circulatory system: Secondary | ICD-10-CM | POA: Diagnosis not present

## 2023-10-14 DIAGNOSIS — R6520 Severe sepsis without septic shock: Secondary | ICD-10-CM | POA: Diagnosis not present

## 2023-10-14 DIAGNOSIS — R29810 Facial weakness: Secondary | ICD-10-CM | POA: Diagnosis not present

## 2023-10-14 DIAGNOSIS — Z79899 Other long term (current) drug therapy: Secondary | ICD-10-CM

## 2023-10-14 DIAGNOSIS — F32A Depression, unspecified: Secondary | ICD-10-CM | POA: Diagnosis present

## 2023-10-14 DIAGNOSIS — Z833 Family history of diabetes mellitus: Secondary | ICD-10-CM

## 2023-10-14 DIAGNOSIS — N189 Chronic kidney disease, unspecified: Secondary | ICD-10-CM | POA: Diagnosis not present

## 2023-10-14 DIAGNOSIS — Z90711 Acquired absence of uterus with remaining cervical stump: Secondary | ICD-10-CM

## 2023-10-14 DIAGNOSIS — R531 Weakness: Secondary | ICD-10-CM | POA: Diagnosis not present

## 2023-10-14 DIAGNOSIS — B021 Zoster meningitis: Secondary | ICD-10-CM | POA: Diagnosis present

## 2023-10-14 DIAGNOSIS — Z800 Family history of malignant neoplasm of digestive organs: Secondary | ICD-10-CM

## 2023-10-14 DIAGNOSIS — F411 Generalized anxiety disorder: Secondary | ICD-10-CM | POA: Diagnosis not present

## 2023-10-14 DIAGNOSIS — Z86718 Personal history of other venous thrombosis and embolism: Secondary | ICD-10-CM | POA: Diagnosis not present

## 2023-10-14 DIAGNOSIS — G9340 Encephalopathy, unspecified: Secondary | ICD-10-CM | POA: Diagnosis not present

## 2023-10-14 DIAGNOSIS — I10 Essential (primary) hypertension: Secondary | ICD-10-CM | POA: Diagnosis not present

## 2023-10-14 DIAGNOSIS — Q828 Other specified congenital malformations of skin: Secondary | ICD-10-CM | POA: Diagnosis not present

## 2023-10-14 DIAGNOSIS — T68XXXA Hypothermia, initial encounter: Secondary | ICD-10-CM | POA: Diagnosis not present

## 2023-10-14 DIAGNOSIS — R569 Unspecified convulsions: Secondary | ICD-10-CM | POA: Diagnosis not present

## 2023-10-14 DIAGNOSIS — E872 Acidosis, unspecified: Secondary | ICD-10-CM | POA: Diagnosis not present

## 2023-10-14 DIAGNOSIS — Z888 Allergy status to other drugs, medicaments and biological substances status: Secondary | ICD-10-CM | POA: Diagnosis not present

## 2023-10-14 DIAGNOSIS — D638 Anemia in other chronic diseases classified elsewhere: Secondary | ICD-10-CM | POA: Diagnosis not present

## 2023-10-14 DIAGNOSIS — J45909 Unspecified asthma, uncomplicated: Secondary | ICD-10-CM | POA: Diagnosis present

## 2023-10-14 DIAGNOSIS — E782 Mixed hyperlipidemia: Secondary | ICD-10-CM | POA: Diagnosis not present

## 2023-10-14 DIAGNOSIS — A408 Other streptococcal sepsis: Principal | ICD-10-CM | POA: Diagnosis present

## 2023-10-14 DIAGNOSIS — Z801 Family history of malignant neoplasm of trachea, bronchus and lung: Secondary | ICD-10-CM

## 2023-10-14 DIAGNOSIS — I129 Hypertensive chronic kidney disease with stage 1 through stage 4 chronic kidney disease, or unspecified chronic kidney disease: Secondary | ICD-10-CM | POA: Diagnosis not present

## 2023-10-14 DIAGNOSIS — B020 Zoster encephalitis: Secondary | ICD-10-CM | POA: Diagnosis not present

## 2023-10-14 DIAGNOSIS — T375X5A Adverse effect of antiviral drugs, initial encounter: Secondary | ICD-10-CM | POA: Diagnosis not present

## 2023-10-14 DIAGNOSIS — N185 Chronic kidney disease, stage 5: Secondary | ICD-10-CM | POA: Diagnosis not present

## 2023-10-14 DIAGNOSIS — I672 Cerebral atherosclerosis: Secondary | ICD-10-CM | POA: Diagnosis not present

## 2023-10-14 DIAGNOSIS — G928 Other toxic encephalopathy: Secondary | ICD-10-CM | POA: Diagnosis not present

## 2023-10-14 DIAGNOSIS — E785 Hyperlipidemia, unspecified: Secondary | ICD-10-CM | POA: Diagnosis present

## 2023-10-14 DIAGNOSIS — I132 Hypertensive heart and chronic kidney disease with heart failure and with stage 5 chronic kidney disease, or end stage renal disease: Secondary | ICD-10-CM | POA: Diagnosis present

## 2023-10-14 DIAGNOSIS — R443 Hallucinations, unspecified: Secondary | ICD-10-CM | POA: Diagnosis present

## 2023-10-14 DIAGNOSIS — D631 Anemia in chronic kidney disease: Secondary | ICD-10-CM | POA: Diagnosis not present

## 2023-10-14 DIAGNOSIS — N184 Chronic kidney disease, stage 4 (severe): Secondary | ICD-10-CM | POA: Diagnosis not present

## 2023-10-14 DIAGNOSIS — R110 Nausea: Principal | ICD-10-CM

## 2023-10-14 DIAGNOSIS — F419 Anxiety disorder, unspecified: Secondary | ICD-10-CM | POA: Diagnosis present

## 2023-10-14 DIAGNOSIS — J9600 Acute respiratory failure, unspecified whether with hypoxia or hypercapnia: Secondary | ICD-10-CM | POA: Diagnosis not present

## 2023-10-14 DIAGNOSIS — Z886 Allergy status to analgesic agent status: Secondary | ICD-10-CM

## 2023-10-14 DIAGNOSIS — I38 Endocarditis, valve unspecified: Secondary | ICD-10-CM | POA: Diagnosis not present

## 2023-10-14 DIAGNOSIS — Z91013 Allergy to seafood: Secondary | ICD-10-CM

## 2023-10-14 LAB — URINALYSIS, ROUTINE W REFLEX MICROSCOPIC
Bacteria, UA: NONE SEEN
Bilirubin Urine: NEGATIVE
Glucose, UA: 50 mg/dL — AB
Hgb urine dipstick: NEGATIVE
Ketones, ur: NEGATIVE mg/dL
Leukocytes,Ua: NEGATIVE
Nitrite: NEGATIVE
Protein, ur: >=300 mg/dL — AB
Specific Gravity, Urine: 1.013 (ref 1.005–1.030)
pH: 6 (ref 5.0–8.0)

## 2023-10-14 LAB — COMPREHENSIVE METABOLIC PANEL WITH GFR
ALT: 8 U/L (ref 0–44)
AST: 21 U/L (ref 15–41)
Albumin: 2.9 g/dL — ABNORMAL LOW (ref 3.5–5.0)
Alkaline Phosphatase: 40 U/L (ref 38–126)
Anion gap: 14 (ref 5–15)
BUN: 80 mg/dL — ABNORMAL HIGH (ref 8–23)
CO2: 20 mmol/L — ABNORMAL LOW (ref 22–32)
Calcium: 8.4 mg/dL — ABNORMAL LOW (ref 8.9–10.3)
Chloride: 100 mmol/L (ref 98–111)
Creatinine, Ser: 5.97 mg/dL — ABNORMAL HIGH (ref 0.44–1.00)
GFR, Estimated: 6 mL/min — ABNORMAL LOW (ref >60)
Glucose, Bld: 130 mg/dL — ABNORMAL HIGH (ref 70–99)
Potassium: 4.3 mmol/L (ref 3.5–5.1)
Sodium: 134 mmol/L — ABNORMAL LOW (ref 135–145)
Total Bilirubin: 0.7 mg/dL (ref 0.0–1.2)
Total Protein: 6.2 g/dL — ABNORMAL LOW (ref 6.5–8.1)

## 2023-10-14 LAB — CBC
HCT: 32.8 % — ABNORMAL LOW (ref 36.0–46.0)
Hemoglobin: 9.9 g/dL — ABNORMAL LOW (ref 12.0–15.0)
MCH: 29.6 pg (ref 26.0–34.0)
MCHC: 30.2 g/dL (ref 30.0–36.0)
MCV: 98.2 fL (ref 80.0–100.0)
Platelets: 202 10*3/uL (ref 150–400)
RBC: 3.34 MIL/uL — ABNORMAL LOW (ref 3.87–5.11)
RDW: 15.1 % (ref 11.5–15.5)
WBC: 2.7 10*3/uL — ABNORMAL LOW (ref 4.0–10.5)
nRBC: 0 % (ref 0.0–0.2)

## 2023-10-14 MED ORDER — MORPHINE SULFATE (PF) 2 MG/ML IV SOLN
2.0000 mg | Freq: Once | INTRAVENOUS | Status: DC
  Filled 2023-10-14: qty 1

## 2023-10-14 MED ORDER — HYDRALAZINE HCL 25 MG PO TABS
100.0000 mg | ORAL_TABLET | Freq: Three times a day (TID) | ORAL | Status: DC
  Administered 2023-10-15: 100 mg via ORAL
  Filled 2023-10-14: qty 4

## 2023-10-14 MED ORDER — FERROUS SULFATE 325 (65 FE) MG PO TABS
325.0000 mg | ORAL_TABLET | Freq: Every day | ORAL | Status: DC
  Administered 2023-10-16 – 2023-10-24 (×8): 325 mg via ORAL
  Filled 2023-10-14 (×10): qty 1

## 2023-10-14 MED ORDER — LACTATED RINGERS IV BOLUS
1000.0000 mL | Freq: Once | INTRAVENOUS | Status: AC
  Administered 2023-10-14: 1000 mL via INTRAVENOUS

## 2023-10-14 MED ORDER — HEPARIN SODIUM (PORCINE) 5000 UNIT/ML IJ SOLN
5000.0000 [IU] | Freq: Three times a day (TID) | INTRAMUSCULAR | Status: DC
  Administered 2023-10-15 – 2023-10-24 (×28): 5000 [IU] via SUBCUTANEOUS
  Filled 2023-10-14 (×28): qty 1

## 2023-10-14 MED ORDER — SODIUM BICARBONATE 650 MG PO TABS
650.0000 mg | ORAL_TABLET | Freq: Two times a day (BID) | ORAL | Status: DC
  Administered 2023-10-15 – 2023-10-24 (×17): 650 mg via ORAL
  Filled 2023-10-14 (×20): qty 1

## 2023-10-14 MED ORDER — ACETAMINOPHEN 650 MG RE SUPP: 650.0000 mg | Freq: Four times a day (QID) | RECTAL | Status: DC | PRN

## 2023-10-14 MED ORDER — VITAMIN B-12 1000 MCG PO TABS
1000.0000 ug | ORAL_TABLET | Freq: Every day | ORAL | Status: DC
  Administered 2023-10-16 – 2023-10-24 (×8): 1000 ug via ORAL
  Filled 2023-10-14 (×10): qty 1

## 2023-10-14 MED ORDER — ACETAMINOPHEN 325 MG PO TABS
650.0000 mg | ORAL_TABLET | Freq: Four times a day (QID) | ORAL | Status: DC | PRN
  Administered 2023-10-15 – 2023-10-24 (×8): 650 mg via ORAL
  Filled 2023-10-14 (×9): qty 2

## 2023-10-14 MED ORDER — AMLODIPINE BESYLATE 5 MG PO TABS
10.0000 mg | ORAL_TABLET | Freq: Every day | ORAL | Status: DC
  Administered 2023-10-15: 10 mg via ORAL
  Filled 2023-10-14: qty 2

## 2023-10-14 MED ORDER — LIDOCAINE 5 % EX PTCH
1.0000 | MEDICATED_PATCH | CUTANEOUS | Status: DC
  Administered 2023-10-15 – 2023-10-23 (×9): 1 via TRANSDERMAL
  Filled 2023-10-14 (×10): qty 1

## 2023-10-14 MED ORDER — BUDESON-GLYCOPYRROL-FORMOTEROL 160-9-4.8 MCG/ACT IN AERO
2.0000 | INHALATION_SPRAY | Freq: Two times a day (BID) | RESPIRATORY_TRACT | Status: DC | PRN
  Administered 2023-10-21 – 2023-10-23 (×2): 2 via RESPIRATORY_TRACT
  Filled 2023-10-14: qty 5.9

## 2023-10-14 MED ORDER — CARVEDILOL 12.5 MG PO TABS
12.5000 mg | ORAL_TABLET | Freq: Two times a day (BID) | ORAL | Status: DC
  Administered 2023-10-15: 12.5 mg via ORAL
  Filled 2023-10-14: qty 1

## 2023-10-14 MED ORDER — ALBUTEROL SULFATE (2.5 MG/3ML) 0.083% IN NEBU
2.5000 mg | INHALATION_SOLUTION | Freq: Four times a day (QID) | RESPIRATORY_TRACT | Status: DC | PRN
  Administered 2023-10-20 – 2023-10-22 (×3): 2.5 mg via RESPIRATORY_TRACT
  Filled 2023-10-14 (×3): qty 3

## 2023-10-14 MED ORDER — MONTELUKAST SODIUM 10 MG PO TABS
10.0000 mg | ORAL_TABLET | Freq: Every day | ORAL | Status: DC
  Administered 2023-10-15 – 2023-10-23 (×9): 10 mg via ORAL
  Filled 2023-10-14 (×10): qty 1

## 2023-10-14 MED ORDER — CITALOPRAM HYDROBROMIDE 20 MG PO TABS
10.0000 mg | ORAL_TABLET | Freq: Every day | ORAL | Status: DC
  Administered 2023-10-15 – 2023-10-23 (×9): 10 mg via ORAL
  Filled 2023-10-14 (×9): qty 1

## 2023-10-14 MED ORDER — ONDANSETRON HCL 4 MG/2ML IJ SOLN
4.0000 mg | Freq: Once | INTRAMUSCULAR | Status: AC
  Administered 2023-10-14: 4 mg via INTRAVENOUS
  Filled 2023-10-14: qty 2

## 2023-10-14 NOTE — ED Provider Notes (Addendum)
 [facility] AT [facility] Provider Note   CSN: [id number] Arrival date & time: 10/14/23  1527     History  Chief Complaint  Patient presents with   Nausea    Shingles    [name] is a 89 y.o. female.  Pt with hx ckd, was seen at urgent care yesterday with new onset/new diagnosis shingles right chest - was prescribed valtrex 1000 mg tid (reportedly took dose yesterday and today). Since then patient go nausea, decreased appetite.  No vomiting. No diarrhea. No abd pain. Pt limited historian - level 5 caveat. Pt denies fevers. Denies any pain other than that associated with shingles right chest. No other reported change in meds, although pt has not taken her normal home meds this AM, including bp med.  Family arrives, they indicate also since starting valtrex, new confusion, and hallucinations.   The history is provided by the patient, medical records and the EMS personnel. The history is limited by the condition of the patient.       Home Medications Prior to Admission medications   Medication Sig Start Date End Date Taking? Authorizing Provider  acetaminophen  (TYLENOL ) 325 MG tablet Take 2 tablets (650 mg total) by mouth every 6 (six) hours as needed for mild pain (pain score 1-3) (or Fever >/= 101). 09/26/23   Regalado, Belkys A, MD  acetaminophen  (TYLENOL ) 500 MG tablet Take 500 mg by mouth 2 (two) times daily as needed for mild pain (pain score 1-3) or headache. Patient not taking: Reported on 10/03/2023    [provider]  albuterol  (PROVENTIL ) (2.5 MG/3ML) 0.083% nebulizer solution Take 3 mLs (2.5 mg total) by nebulization every 6 (six) hours as needed for shortness of breath or wheezing. 08/03/22   Starlene Eaton, FNP  albuterol  (VENTOLIN  HFA) 108 (90 Base) MCG/ACT inhaler USE 2 PUFFS EVERY 6 HOURS AS NEEDED FOR WHEEZING. 04/19/23   Denson Flake, MD  amLODipine  (NORVASC ) 10 MG tablet Take 1 tablet (10 mg total) by mouth  daily. Patient taking differently: Take 10 mg by mouth at bedtime. 11/07/22   Maylene Spear, MD  Budeson-Glycopyrrol-Formoterol  (BREZTRI  AEROSPHERE) 160-9-4.8 MCG/ACT AERO INHALE 2 PUFFS INTO THE LUNGS TWICE DAILY, IN THE MORNING & AT BEDTIME. Patient taking differently: Inhale 2 puffs into the lungs 2 (two) times daily as needed (wheezing, shortness of breath). 04/19/23   Denson Flake, MD  carvedilol  (COREG ) 12.5 MG tablet Take 1 tablet (12.5 mg total) by mouth 2 (two) times daily. 04/06/23   Nahser, Lela Purple, MD  citalopram  (CELEXA ) 20 MG tablet Take 0.5 tablets (10 mg total) by mouth daily. Patient taking differently: Take 10 mg by mouth at bedtime. 08/07/23   Panosh, Wanda K, MD  cloNIDine  (CATAPRES ) 0.1 MG tablet TAKE ONE TABLET BY MOUTH TWICE DAILY AS NEEDED for hypertension Patient taking differently: Take 0.1 mg by mouth 2 (two) times daily as needed (SBP > 160). 08/07/23   Panosh, Joaquim Muir, MD  ferrous sulfate  325 (65 FE) MG EC tablet Take 1 tablet (325 mg total) by mouth daily with breakfast. 12/15/14   Panosh, Wanda K, MD  fluticasone  (FLONASE ) 50 MCG/ACT nasal spray Place 2 sprays into both nostrils as needed for allergies or rhinitis.    [provider]  hydrALAZINE  (APRESOLINE ) 100 MG tablet Take 1 tablet (100 mg total) by mouth 3 (three) times daily. 05/21/23   Nahser, Lela Purple, MD  lidocaine  (LIDODERM ) 5 % Place 1 patch onto the skin daily.  Remove & Discard patch within 12 hours or as directed by MD 09/27/23   Regalado, Clifford Dam A, MD  LORazepam  (ATIVAN ) 0.5 MG tablet Take 1 tablet (0.5 mg total) by mouth 2 (two) times daily as needed. for anxiety 09/26/23   Regalado, Belkys A, MD  montelukast  (SINGULAIR ) 10 MG tablet Take 1 tablet (10 mg total) by mouth daily. Patient taking differently: Take 10 mg by mouth at bedtime. 04/19/23   Denson Flake, MD  sodium bicarbonate  650 MG tablet Take 1 tablet (650 mg total) by mouth 2 (two) times daily. 09/26/23   Regalado, Brigido Canales, MD   Spacer/Aero-Holding Chambers (AEROCHAMBER PLUS) inhaler Use as instructed 05/26/16   Ethlyn Herd, MD  torsemide  40 MG TABS Take 40 mg by mouth 2 (two) times daily. 09/26/23   Regalado, Belkys A, MD  valACYclovir (VALTREX) 1000 MG tablet Take 1 tablet (1,000 mg total) by mouth 3 (three) times daily. 10/13/23   Ward, Char Common, PA-C  vitamin B-12 (CYANOCOBALAMIN ) 1000 MCG tablet Take 1,000 mcg by mouth daily.    [provider]  Vitamin D , Cholecalciferol , 25 MCG (1000 UT) TABS Take 1,000 Units by mouth daily. 05/30/23   Webb, Padonda B, FNP      Allergies    Penicillins, Asa [aspirin], and Fish allergy    Review of Systems   Review of Systems  Constitutional:  Negative for fever.  HENT:  Negative for sore throat.   Respiratory:  Negative for cough and shortness of breath.   Cardiovascular:  Negative for chest pain.  Gastrointestinal:  Positive for nausea. Negative for abdominal pain.  Genitourinary:  Negative for dysuria and flank pain.  Musculoskeletal:  Negative for neck pain and neck stiffness.  Skin:  Positive for rash.  Neurological:  Negative for weakness, numbness and headaches.  Psychiatric/Behavioral:  Positive for confusion.     Physical Exam Updated Vital Signs BP (!) 162/67   Pulse 67   Temp 97.6 F (36.4 C) (Oral)   Ht 1.575 m (5\' 2" )   Wt 61.2 kg   SpO2 96%   BMI 24.69 kg/m  Physical Exam Vitals and nursing note reviewed.  Constitutional:      Appearance: Normal appearance. She is well-developed.  HENT:     Head: Atraumatic.     Nose: Nose normal.     Mouth/Throat:     Mouth: Mucous membranes are moist.     Pharynx: Oropharynx is clear. No oropharyngeal exudate or posterior oropharyngeal erythema.  Eyes:     General: No scleral icterus.    Conjunctiva/sclera: Conjunctivae normal.     Pupils: Pupils are equal, round, and reactive to light.  Neck:     Trachea: No tracheal deviation.     Comments: Trachea midline. Thyroid  not grossly enlarged  or tender. No neck stiffness or rigidity.  Cardiovascular:     Rate and Rhythm: Normal rate and regular rhythm.     Pulses: Normal pulses.     Heart sounds: Normal heart sounds. No murmur heard.    No friction rub. No gallop.  Pulmonary:     Effort: Pulmonary effort is normal. No respiratory distress.     Breath sounds: Normal breath sounds.  Abdominal:     General: Bowel sounds are normal. There is no distension.     Palpations: Abdomen is soft.     Tenderness: There is no abdominal tenderness. There is no guarding.  Genitourinary:    Comments: No cva tenderness.  Musculoskeletal:  General: No swelling or tenderness.     Cervical back: Normal range of motion and neck supple. No rigidity. No muscular tenderness.     Right lower leg: No edema.     Left lower leg: No edema.  Skin:    General: Skin is warm and dry.     Findings: Rash present.     Comments: Rash c/w shingles right upper chest. No cellulitis.   Neurological:     Mental Status: She is alert.     Comments: Alert, speech normal. Motor/sens grossly intact bil.   Psychiatric:        Mood and Affect: Mood normal.     ED Results / Procedures / Treatments   Labs (all labs ordered are listed, but only abnormal results are displayed) Results for orders placed or performed during the hospital encounter of 10/14/23  CBC   Collection Time: 10/14/23  5:20 PM  Result Value Ref Range   WBC 2.7 (L) 4.0 - 10.5 K/uL   RBC 3.34 (L) 3.87 - 5.11 MIL/uL   Hemoglobin 9.9 (L) 12.0 - 15.0 g/dL   HCT 13.2 (L) 44.0 - 10.2 %   MCV 98.2 80.0 - 100.0 fL   MCH 29.6 26.0 - 34.0 pg   MCHC 30.2 30.0 - 36.0 g/dL   RDW 72.5 36.6 - 44.0 %   Platelets 202 150 - 400 K/uL   nRBC 0.0 0.0 - 0.2 %  Comprehensive metabolic panel with GFR   Collection Time: 10/14/23  5:20 PM  Result Value Ref Range   Sodium 134 (L) 135 - 145 mmol/L   Potassium 4.3 3.5 - 5.1 mmol/L   Chloride 100 98 - 111 mmol/L   CO2 20 (L) 22 - 32 mmol/L   Glucose, Bld  130 (H) 70 - 99 mg/dL   BUN 80 (H) 8 - 23 mg/dL   Creatinine, Ser 3.47 (H) 0.44 - 1.00 mg/dL   Calcium  8.4 (L) 8.9 - 10.3 mg/dL   Total Protein 6.2 (L) 6.5 - 8.1 g/dL   Albumin  2.9 (L) 3.5 - 5.0 g/dL   AST 21 15 - 41 U/L   ALT 8 0 - 44 U/L   Alkaline Phosphatase 40 38 - 126 U/L   Total Bilirubin 0.7 0.0 - 1.2 mg/dL   GFR, Estimated 6 (L) >60 mL/min   Anion gap 14 5 - 15  Urinalysis, Routine w reflex microscopic -Urine, Catheterized   Collection Time: 10/14/23  5:20 PM  Result Value Ref Range   Color, Urine YELLOW YELLOW   APPearance CLEAR CLEAR   Specific Gravity, Urine 1.013 1.005 - 1.030   pH 6.0 5.0 - 8.0   Glucose, UA 50 (A) NEGATIVE mg/dL   Hgb urine dipstick NEGATIVE NEGATIVE   Bilirubin Urine NEGATIVE NEGATIVE   Ketones, ur NEGATIVE NEGATIVE mg/dL   Protein, ur >=425 (A) NEGATIVE mg/dL   Nitrite NEGATIVE NEGATIVE   Leukocytes,Ua NEGATIVE NEGATIVE   RBC / HPF 0-5 0 - 5 RBC/hpf   WBC, UA 0-5 0 - 5 WBC/hpf   Bacteria, UA NONE SEEN NONE SEEN   Squamous Epithelial / HPF 0-5 0 - 5 /HPF   Mucus PRESENT    *Note: Due to a large number of results and/or encounters for the requested time period, some results have not been displayed. A complete set of results can be found in Results Review.   ECHOCARDIOGRAM COMPLETE Result Date: 09/23/2023    ECHOCARDIOGRAM REPORT   Patient Name:   AMARIYAH BAZAR [facility] Date  of Exam: 09/23/2023 Medical Rec #:  [id number]          Height:       62.0 in Accession #:    [id number]         Weight:       138.4 lb Date of Birth:  [birth year]           BSA:          1.635 m Patient Age:    89 years           BP:           175/63 mmHg Patient Gender: F                  HR:           68 bpm. Exam Location:  Inpatient Procedure: 2D Echo, Cardiac Doppler and Color Doppler (Both Spectral and Color            Flow Doppler were utilized during procedure). Indications:    CHF-Acute Systolic I50.21  History:        Patient has prior history of Echocardiogram examinations,  most                 recent 08/08/2023. CHF; Risk Factors:Hypertension, Diabetes and                 Dyslipidemia. Hx of CKD.  Sonographer:    Denese Finn RCS Referring Phys: [id number] SAGAR H JINWALA IMPRESSIONS  1. Left ventricular ejection fraction, by estimation, is 45 to 50%. The left ventricle has mildly decreased function. The left ventricle demonstrates global hypokinesis. There is severe left ventricular hypertrophy. Left ventricular diastolic parameters  are consistent with Grade III diastolic dysfunction (restrictive). Elevated left atrial pressure.  2. Right ventricular systolic function is normal. The right ventricular size is normal. There is severely elevated pulmonary artery systolic pressure. The estimated right ventricular systolic pressure is 86.6 mmHg.  3. Left atrial size was severely dilated.  4. Right atrial size was severely dilated.  5. The mitral valve is abnormal. Thickened leaflets. Moderate mitral valve regurgitation.  6. Tricuspid valve regurgitation is mild to moderate.  7. The aortic valve is tricuspid. Aortic valve regurgitation is trivial. Aortic valve sclerosis is present, with no evidence of aortic valve stenosis.  8. The inferior vena cava is dilated in size with <50% respiratory variability, suggesting right atrial pressure of 15 mmHg. FINDINGS  Left Ventricle: Left ventricular ejection fraction, by estimation, is 45 to 50%. The left ventricle has mildly decreased function. The left ventricle demonstrates global hypokinesis. The left ventricular internal cavity size was normal in size. There is  severe left ventricular hypertrophy. Left ventricular diastolic parameters are consistent with Grade III diastolic dysfunction (restrictive). Elevated left atrial pressure. Right Ventricle: The right ventricular size is normal. No increase in right ventricular wall thickness. Right ventricular systolic function is normal. There is severely elevated pulmonary artery systolic pressure. The  tricuspid regurgitant velocity is 4.23 m/s, and with an assumed right atrial pressure of 15 mmHg, the estimated right ventricular systolic pressure is 86.6 mmHg. Left Atrium: Left atrial size was severely dilated. Right Atrium: Right atrial size was severely dilated. Pericardium: Trivial pericardial effusion is present. Mitral Valve: The mitral valve is abnormal. Moderate mitral valve regurgitation. Tricuspid Valve: The tricuspid valve is normal in structure. Tricuspid valve regurgitation is mild to moderate. Aortic Valve: The aortic valve is tricuspid. Aortic valve regurgitation is trivial. Aortic valve sclerosis is present, with  no evidence of aortic valve stenosis. Pulmonic Valve: The pulmonic valve was grossly normal. Pulmonic valve regurgitation is trivial. Aorta: The aortic root is normal in size and structure. Venous: The inferior vena cava is dilated in size with less than 50% respiratory variability, suggesting right atrial pressure of 15 mmHg. IAS/Shunts: The interatrial septum was not well visualized.  LEFT VENTRICLE PLAX 2D LVIDd:         4.90 cm   Diastology LVIDs:         3.50 cm   LV e' medial:    4.16 cm/s LV PW:         1.60 cm   LV E/e' medial:  34.3 LV IVS:        1.50 cm   LV e' lateral:   4.99 cm/s LVOT diam:     2.00 cm   LV E/e' lateral: 28.7 LV SV:         74 LV SV Index:   46 LVOT Area:     3.14 cm  RIGHT VENTRICLE RV S prime:     11.50 cm/s TAPSE (M-mode): 2.4 cm LEFT ATRIUM             Index        RIGHT ATRIUM           Index LA diam:        4.50 cm 2.75 cm/m   RA Area:     24.70 cm LA Vol (A2C):   89.5 ml 54.74 ml/m  RA Volume:   87.80 ml  53.70 ml/m LA Vol (A4C):   90.6 ml 55.41 ml/m LA Biplane Vol: 90.6 ml 55.41 ml/m  AORTIC VALVE LVOT Vmax:   102.00 cm/s LVOT Vmean:  68.200 cm/s LVOT VTI:    0.237 m  AORTA Ao Root diam: 3.10 cm MITRAL VALVE                  TRICUSPID VALVE MV Area (PHT): 7.66 cm       TR Peak grad:   71.6 mmHg MV Decel Time: 99 msec        TR Vmax:         423.00 cm/s MR Peak grad:    134.6 mmHg MR Mean grad:    92.0 mmHg    SHUNTS MR Vmax:         580.00 cm/s  Systemic VTI:  0.24 m MR Vmean:        454.0 cm/s   Systemic Diam: 2.00 cm MR PISA:         3.08 cm MR PISA Eff ROA: 12 mm MR PISA Radius:  0.70 cm MV E velocity: 143.00 cm/s MV A velocity: 69.80 cm/s MV E/A ratio:  2.05 Carson Clara MD Electronically signed by Carson Clara MD Signature Date/Time: 09/23/2023/7:01:41 PM    Final    US  RENAL Result Date: 09/23/2023 CLINICAL DATA:  Chronic kidney disease. EXAM: RENAL / URINARY TRACT ULTRASOUND COMPLETE COMPARISON:  None Available. FINDINGS: Right Kidney: Renal measurements: 8.3 x 3.7 x 4.6 cm = volume: 74.4 mL. Diffuse increased parenchymal echogenicity. Left kidney cyst measures 1.7 cm. This appears anechoic with increased through transmission. No hydronephrosis. Left Kidney: Renal measurements: 9.4 x 4.1 x 4.5 cm. = volume: 90.3 ml. Normal parenchymal echogenicity. Upper pole kidney cyst measures 3.3 cm. No hydronephrosis. Bladder: Decompressed. Other: None. IMPRESSION: 1. No acute findings. No hydronephrosis. 2. Increased right renal parenchymal echogenicity compatible with chronic medical renal disease. 3. Bilateral renal cysts.  Electronically Signed   By: Kimberley Penman M.D.   On: 09/23/2023 09:36   CT CHEST WO CONTRAST Result Date: 09/22/2023 CLINICAL DATA:  Followup abnormal chest x-ray. EXAM: CT CHEST WITHOUT CONTRAST TECHNIQUE: Multidetector CT imaging of the chest was performed following the standard protocol without IV contrast. RADIATION DOSE REDUCTION: This exam was performed according to the departmental dose-optimization program which includes automated exposure control, adjustment of the mA and/or kV according to patient size and/or use of iterative reconstruction technique. COMPARISON:  Chest x-ray 09/22/2023 and prior chest CT 08/08/2023 FINDINGS: Cardiovascular: The heart is mildly enlarged but stable. No pericardial  effusion. Stable tortuosity and heavy calcification of the thoracic aorta. Stable three-vessel coronary artery calcifications. Mediastinum/Nodes: Scattered borderline mediastinal and hilar lymph nodes, unchanged. No new or progressive findings. The esophagus is grossly normal. Lungs/Pleura: Interval enlargement of bilateral pleural effusions which are moderate sized. Associated fluid in the right major fissure and some overlying atelectasis. Multifocal ground-glass opacities in both lungs suspicious for asymmetric pulmonary edema or atypical/viral pneumonia. More focal areas of dense airspace consolidation are noted in the right lower lobe and left upper lobe which could be secondary bacterial infiltrates. Rounded nodular densities are likely infiltrates. The tracheobronchial tree is grossly normal. No findings suspicious for aspiration. Underlying emphysematous changes and pulmonary scarring. Upper Abdomen: No significant upper abdominal findings. Stable left renal cyst not requiring any further imaging evaluation or follow-up. Stable vascular disease. Musculoskeletal: No breast masses, supraclavicular or axillary adenopathy. The bony thorax is intact. IMPRESSION: 1. Interval enlargement of bilateral pleural effusions which are moderate sized. 2. Multifocal ground-glass opacities in both lungs suspicious for asymmetric pulmonary edema or atypical/viral pneumonia. 3. More focal areas of dense airspace consolidation in the right lower lobe and left upper lobe could be secondary bacterial infiltrates. 4. Underlying emphysematous changes and pulmonary scarring. 5. Stable borderline mediastinal and hilar lymph nodes, likely reactive. 6. Stable vascular disease. Aortic Atherosclerosis (ICD10-I70.0) and Emphysema (ICD10-J43.9). Electronically Signed   By: Marrian Siva M.D.   On: 09/22/2023 16:34   DG Chest Port 1 View Result Date: 09/22/2023 CLINICAL DATA:  Chest pain and shortness of breath. EXAM: PORTABLE CHEST 1  VIEW COMPARISON:  August 09, 2023 FINDINGS: The cardiac silhouette is enlarged and unchanged in size. There is marked severity calcification of the aortic arch. Chronic appearing increased interstitial lung markings are seen with multifocal areas of airspace disease seen bilaterally. There are small bilateral pleural effusions. No pneumothorax is identified. Multilevel degenerative changes are seen throughout the thoracic spine. IMPRESSION: 1. Chronic appearing increased interstitial lung markings with multifocal areas of airspace disease bilaterally. Correlation with chest CT is recommended, as multifocal infiltrates cannot be excluded. 2. Small bilateral pleural effusions. Electronically Signed   By: Virgle Grime M.D.   On: 09/22/2023 03:13   VAS US  UPPER EXTREMITY VENOUS DUPLEX Result Date: 09/19/2023 UPPER VENOUS STUDY  Patient Name:  Aricela Bertagnolli  Date of Exam:   09/19/2023 Medical Rec #: [id number]           Accession #:    [id number] Date of Birth: October 26, 1933            Patient Gender: F Patient Age:   70 years Exam Location:  [address] Procedure:      VAS US  UPPER EXTREMITY VENOUS DUPLEX Referring Phys: Daphine Eagle --------------------------------------------------------------------------------  Indications: Swelling, and Edema of the right hand and right arm which started about 3 weeks ago. It comes and goes. Performing Technologist: Harless Lien RVT  Examination Guidelines: A complete evaluation includes B-mode imaging, spectral Doppler, color Doppler, and power Doppler as needed of all accessible portions of each vessel. Bilateral testing is considered an integral part of a complete examination. Limited examinations for reoccurring indications may be performed as noted.  Right Findings: +----------+------------+---------+-----------+----------+-------------+ RIGHT     CompressiblePhasicitySpontaneousProperties   Summary     +----------+------------+---------+-----------+----------+-------------+ IJV           Full       Yes       Yes                            +----------+------------+---------+-----------+----------+-------------+ Subclavian               Yes       Yes                            +----------+------------+---------+-----------+----------+-------------+ Axillary      Full       Yes       Yes                            +----------+------------+---------+-----------+----------+-------------+ Brachial      Full       Yes       Yes                            +----------+------------+---------+-----------+----------+-------------+ Radial        Full                                                +----------+------------+---------+-----------+----------+-------------+ Ulnar         Full                                                +----------+------------+---------+-----------+----------+-------------+ Cephalic      Full                                  small caliber +----------+------------+---------+-----------+----------+-------------+ Basilic       Full       Yes       Yes                            +----------+------------+---------+-----------+----------+-------------+ Innominate               Yes       Yes                            +----------+------------+---------+-----------+----------+-------------+  Left Findings: +----------+------------+---------+-----------+----------+-------+ LEFT      CompressiblePhasicitySpontaneousPropertiesSummary +----------+------------+---------+-----------+----------+-------+ Subclavian               Yes       Yes                      +----------+------------+---------+-----------+----------+-------+  Summary:  Right: No evidence of deep vein thrombosis in the upper extremity. No evidence of superficial  vein thrombosis in the upper extremity.  Left: No evidence of thrombosis in the subclavian.  *See table(s)  above for measurements and observations.  Diagnosing physician: Angela Kell MD Electronically signed by Angela Kell MD on 09/19/2023 at 4:51:20 PM.    Final      EKG None  Radiology No results found.  Procedures Procedures    Medications Ordered in ED Medications  morphine  (PF) 2 MG/ML injection 2 mg (has no administration in time range)  lactated ringers  bolus 1,000 mL (0 mLs Intravenous Stopped 10/14/23 1830)  ondansetron  (ZOFRAN ) injection 4 mg (4 mg Intravenous Given 10/14/23 1832)    ED Course/ Medical Decision Making/ A&P                                 Medical Decision Making Problems Addressed: Adverse effect of valaciclovir: acute illness or injury with systemic symptoms that poses a threat to life or bodily functions Generalized weakness: acute illness or injury with systemic symptoms that poses a threat to life or bodily functions Herpes zoster with other complication: acute illness or injury with systemic symptoms Nausea: acute illness or injury with systemic symptoms Stage 5 chronic kidney disease not on chronic dialysis [facility]): chronic illness or injury with exacerbation, progression, or side effects of treatment that poses a threat to life or bodily functions  Amount and/or Complexity of Data Reviewed Independent Historian: EMS    Details: Ems/family, hx External Data Reviewed: notes. Labs: ordered. Decision-making details documented in ED Course. Discussion of management or test interpretation with external provider(s): hospitalists  Risk Prescription drug management. Parenteral controlled substances. Decision regarding hospitalization.   Iv ns. Continuous pulse ox and cardiac monitoring. Labs ordered/sent.   Differential diagnosis includes valtrex side effect/toxicity (given prescribed dose and history significant ckd), dehydration, uti, etc. Dispo decision including potential need for admission considered - will get labs and reassess.   Reviewed nursing  notes and prior charts for additional history. External reports reviewed. Additional history from: EMS.  Pt requests pain med for right chest/area of shingles. Denies other pain. No headaches. Patient with no neck stiffness or rigidity.   LR bolus. Morphine  iv. Zofran  iv.   Cardiac monitor: sinus rhythm, rate 70.  Labs reviewed/interpreted by me - wbc and hgb c/w baseline. Chem w significant ckd, k is normal. Bun higher than prior ?dehydration.   Po fluids/food.   Given gen weakness, nausea/decreased po intake, valtrex associated neurologic symptoms, will consult hospitalists for admission.            Final Clinical Impression(s) / ED Diagnoses Final diagnoses:  None    Rx / DC Orders ED Discharge Orders     None         Guadalupe Lee, MD 10/14/23 1918

## 2023-10-14 NOTE — ED Notes (Signed)
 Attempted to get a temp but pt refused to let me get it. I attempted to get a temp axillary but it would not read.

## 2023-10-14 NOTE — H&P (Signed)
 History and Physical    [name] BJY:[id number] DOB: 24-Nov-1933 DOA: 10/14/2023  Patient coming from: Home.  Chief Complaint: Increasing confusion.  HPI: [name] is a 89 y.o. female with history of chronic kidney disease stage V, hypertension, asthma, anemia, CHF recently admitted for respiratory failure secondary to acute CHF discharge about 2 weeks ago was brought to the ER after patient's family noticed patient was getting increasingly confused.  About 2 days ago patient started developing right neck and right upper chest vesicular lesions which was getting painful and yesterday had gone to her primary care physician was prescribed Valtrex for herpes zoster.  Patient has so far taken 5 doses of Valtrex 1 g.  Patient became confused was hallucinating and was talking about things which were not there.  Patient was brought to the ER.  ED Course: In the ER initially patient was afebrile.  CT head was unremarkable.  Labs were at baseline.  Patient was admitted for possible confusion with concern for Valtrex toxicity.  While in the ER patient had 2 episodes of nausea vomiting and patient became hypothermic with temperature at 94 F.  At this point blood cultures were obtained and x-rays were ordered along with TSH cortisol levels.  Since x-ray was showing possible infiltrates was started on empiric antibiotics for possible sepsis.  Review of Systems: As per HPI, rest all negative.   Past Medical History:  Diagnosis Date   Acute superficial venous thrombosis of left lower extremity 03/27/2014   concern about hx and potential extensive on exam tenderness calf and low dose lovenox  40 qd 2-4 weekselevetion and close  fu advised .    Allergy    Anemia    Anxiety    Arthritis    "shoulders" (09/09/2012)   Asthma 09/08/2012   Carotid bruit    us  2018  low risk 1 - 39%   Cataract    bil cateracts removed   CKD (chronic kidney disease) stage 5, GFR less than 15 ml/min (HCC)    as  of may 2025, creat 4.5- 5.5 range   Clotting disorder (HCC)    blood clots in legs   Diabetes mellitus without complication (HCC)    "Borderline" per pt; being monitored.  no meds per pt   Diverticulosis    Dyspnea 04/27/2014   GERD (gastroesophageal reflux disease)    Headache(784.0)    "related to my high blood pressure" (09/09/2012)   Hiatal hernia    History of DVT (deep vein thrombosis)    "RLE" (09/09/2012) coumadine cant take asa so on plavix     HTN (hypertension) 09/08/2012   Hyperlipidemia    Lupus    "cured years ago" (09/09/2012)   Other dysphagia 02/11/2013   Syncope and collapse 01/21/2018   Varicose veins    Varicose veins of leg with complications 12/05/2010    Past Surgical History:  Procedure Laterality Date   ABDOMINAL HYSTERECTOMY     partial   BREAST EXCISIONAL BIOPSY Right    BREAST SURGERY     CARPAL TUNNEL RELEASE Right 2000's   CARPAL TUNNEL RELEASE Bilateral 10/21/2013   Procedure: BILATERAL  CARPAL TUNNEL RELEASE;  Surgeon: Kemp Patter, MD;  Location: [facility];  Service: Orthopedics;  Laterality: Bilateral;   COLONOSCOPY     SHOULDER OPEN ROTATOR CUFF REPAIR Bilateral 2000's   TRIGGER FINGER RELEASE Right 10/21/2013   Procedure: RELEASE TRIGGER FINGER/A-1 PULLEY RIGHT MIDDLE AND RIGHT RING;  Surgeon: Kemp Patter, MD;  Location: [facility];  Service: Orthopedics;  Laterality: Right;   UPPER GASTROINTESTINAL ENDOSCOPY       reports that she has never smoked. She has never used smokeless tobacco. She reports that she does not drink alcohol and does not use drugs.  Allergies  Allergen Reactions   Penicillins Nausea And Vomiting   Asa [Aspirin] Other (See Comments)    Wheezing Acetaminophen  is OK    Fish Allergy Itching, Nausea And Vomiting and Swelling    Swelling of the face    Family History  Problem Relation Age of Onset   Hypertension Mother    Lung cancer Mother    Hypertension Father    Lung cancer  Father    Breast cancer Sister    Colon cancer Sister        ? 22' s dx - died in 14's   Breast cancer Sister    Hypertension Brother    Rectal cancer Brother    Stomach cancer Brother    Breast cancer Brother    Heart attack Daughter    Esophageal cancer Daughter    Hypertension Daughter    Diabetes Daughter    Breast cancer Paternal Aunt    Lung cancer Other        both parents    Pancreatic cancer Neg Hx     Prior to Admission medications   Medication Sig Start Date End Date Taking? Authorizing Provider  acetaminophen  (TYLENOL ) 325 MG tablet Take 2 tablets (650 mg total) by mouth every 6 (six) hours as needed for mild pain (pain score 1-3) (or Fever >/= 101). 09/26/23  Yes Regalado, Belkys A, MD  albuterol  (PROVENTIL ) (2.5 MG/3ML) 0.083% nebulizer solution Take 3 mLs (2.5 mg total) by nebulization every 6 (six) hours as needed for shortness of breath or wheezing. 08/03/22  Yes Starlene Eaton, FNP  albuterol  (VENTOLIN  HFA) 108 (90 Base) MCG/ACT inhaler USE 2 PUFFS EVERY 6 HOURS AS NEEDED FOR WHEEZING. 04/19/23  Yes Byrum, Delora Ferry, MD  amLODipine  (NORVASC ) 10 MG tablet Take 1 tablet (10 mg total) by mouth daily. Patient taking differently: Take 10 mg by mouth at bedtime. 11/07/22  Yes Maylene Spear, MD  Budeson-Glycopyrrol-Formoterol  (BREZTRI  AEROSPHERE) 160-9-4.8 MCG/ACT AERO INHALE 2 PUFFS INTO THE LUNGS TWICE DAILY, IN THE MORNING & AT BEDTIME. Patient taking differently: Inhale 2 puffs into the lungs 2 (two) times daily as needed (wheezing, shortness of breath). 04/19/23  Yes Denson Flake, MD  carvedilol  (COREG ) 12.5 MG tablet Take 1 tablet (12.5 mg total) by mouth 2 (two) times daily. 04/06/23  Yes Nahser, Lela Purple, MD  citalopram  (CELEXA ) 20 MG tablet Take 0.5 tablets (10 mg total) by mouth daily. Patient taking differently: Take 10 mg by mouth at bedtime. 08/07/23  Yes Panosh, Wanda K, MD  cloNIDine  (CATAPRES ) 0.1 MG tablet TAKE ONE TABLET BY MOUTH TWICE DAILY AS NEEDED  for hypertension Patient taking differently: Take 0.1 mg by mouth 2 (two) times daily as needed (SBP > 160). 08/07/23  Yes Panosh, Joaquim Muir, MD  ferrous sulfate  325 (65 FE) MG EC tablet Take 1 tablet (325 mg total) by mouth daily with breakfast. 12/15/14  Yes Panosh, Wanda K, MD  fluticasone  (FLONASE ) 50 MCG/ACT nasal spray Place 2 sprays into both nostrils as needed for allergies or rhinitis.   Yes [provider]  hydrALAZINE  (APRESOLINE ) 100 MG tablet Take 1 tablet (100 mg total) by mouth 3 (three) times daily. 05/21/23  Yes Nahser, Lela Purple, MD  lidocaine  (LIDODERM ) 5 % Place 1 patch onto the skin daily. Remove & Discard patch within 12 hours or as directed by MD 09/27/23  Yes Regalado, Belkys A, MD  LORazepam  (ATIVAN ) 0.5 MG tablet Take 1 tablet (0.5 mg total) by mouth 2 (two) times daily as needed. for anxiety 09/26/23  Yes Regalado, Belkys A, MD  montelukast  (SINGULAIR ) 10 MG tablet Take 1 tablet (10 mg total) by mouth daily. Patient taking differently: Take 10 mg by mouth at bedtime. 04/19/23  Yes Denson Flake, MD  sodium bicarbonate  650 MG tablet Take 1 tablet (650 mg total) by mouth 2 (two) times daily. 09/26/23  Yes Regalado, Belkys A, MD  torsemide  40 MG TABS Take 40 mg by mouth 2 (two) times daily. 09/26/23  Yes Regalado, Belkys A, MD  valACYclovir (VALTREX) 1000 MG tablet Take 1 tablet (1,000 mg total) by mouth 3 (three) times daily. 10/13/23  Yes Ward, Char Common, PA-C  vitamin B-12 (CYANOCOBALAMIN ) 1000 MCG tablet Take 1,000 mcg by mouth daily.   Yes [provider]  Vitamin D , Cholecalciferol , 25 MCG (1000 UT) TABS Take 1,000 Units by mouth daily. 05/30/23  Yes Rondall Codding, FNP  Spacer/Aero-Holding Chambers (AEROCHAMBER PLUS) inhaler Use as instructed 05/26/16   Ethlyn Herd, MD    Physical Exam: Constitutional: Moderately built and nourished. Vitals:   10/14/23 1548 10/14/23 1800 10/14/23 1930 10/14/23 2100  BP: (!) 184/68 (!) 162/67 (!) 179/67 (!) 174/65   Pulse: 70 67 68 66  Resp:   17 15  Temp: 97.6 F (36.4 C)     TempSrc: Oral     SpO2: 99% 96% 98% 98%  Weight: 61.2 kg     Height: 5\' 2"  (1.575 m)      Eyes: Anicteric no pallor. ENMT: No discharge from the ears eyes nose or mouth. Neck: No mass felt.  No neck rigidity. Respiratory: No rhonchi or crepitations. Cardiovascular: S1-S2 heard. Abdomen: Soft nontender bowel sound present. Musculoskeletal: No edema. Skin: Acicular rash in the right side of the neck and right upper chest. Neurologic: Alert awake oriented to name and place moving all extremities. Psychiatric: Oriented to place and person.   Labs on Admission: I have personally reviewed following labs and imaging studies  CBC: Recent Labs  Lab 10/09/23 1351 10/14/23 1720  WBC 4.3 2.7*  NEUTROABS 2.8  --   HGB 10.3* 9.9*  HCT 33.1* 32.8*  MCV 94.3 98.2  PLT 291 202   Basic Metabolic Panel: Recent Labs  Lab 10/14/23 1720  NA 134*  K 4.3  CL 100  CO2 20*  GLUCOSE 130*  BUN 80*  CREATININE 5.97*  CALCIUM  8.4*   GFR: Estimated Creatinine Clearance: 5.7 mL/min (A) (by C-G formula based on SCr of 5.97 mg/dL (H)). Liver Function Tests: Recent Labs  Lab 10/14/23 1720  AST 21  ALT 8  ALKPHOS 40  BILITOT 0.7  PROT 6.2*  ALBUMIN  2.9*   No results for input(s): "LIPASE", "AMYLASE" in the last 168 hours. No results for input(s): "AMMONIA" in the last 168 hours. Coagulation Profile: No results for input(s): "INR", "PROTIME" in the last 168 hours. Cardiac Enzymes: No results for input(s): "CKTOTAL", "CKMB", "CKMBINDEX", "TROPONINI" in the last 168 hours. BNP (last 3 results) Recent Labs    09/22/23 0036  PROBNP >35,000.0*   HbA1C: No results for input(s): "HGBA1C" in the last 72 hours. CBG: No results for input(s): "GLUCAP" in the last 168 hours. Lipid Profile: No results for input(s): "CHOL", "HDL", "LDLCALC", "  TRIG", "CHOLHDL", "LDLDIRECT" in the last 72 hours. Thyroid  Function Tests: No  results for input(s): "TSH", "T4TOTAL", "FREET4", "T3FREE", "THYROIDAB" in the last 72 hours. Anemia Panel: No results for input(s): "VITAMINB12", "FOLATE", "FERRITIN", "TIBC", "IRON", "RETICCTPCT" in the last 72 hours. Urine analysis:    Component Value Date/Time   COLORURINE YELLOW 10/14/2023 1720   APPEARANCEUR CLEAR 10/14/2023 1720   LABSPEC 1.013 10/14/2023 1720   PHURINE 6.0 10/14/2023 1720   GLUCOSEU 50 (A) 10/14/2023 1720   GLUCOSEU NEGATIVE 01/16/2023 1238   HGBUR NEGATIVE 10/14/2023 1720   BILIRUBINUR NEGATIVE 10/14/2023 1720   KETONESUR NEGATIVE 10/14/2023 1720   PROTEINUR >=300 (A) 10/14/2023 1720   UROBILINOGEN 0.2 01/16/2023 1238   NITRITE NEGATIVE 10/14/2023 1720   LEUKOCYTESUR NEGATIVE 10/14/2023 1720   Sepsis Labs: @LABRCNTIP (procalcitonin:4,lacticidven:4) )No results found for this or any previous visit (from the past 240 hours).   Radiological Exams on Admission: CT Head Wo Contrast Result Date: 10/14/2023 CLINICAL DATA:  Mental status changes, unknown cause, with nausea. Recently began medication for shingles outbreak. EXAM: CT HEAD WITHOUT CONTRAST TECHNIQUE: Contiguous axial images were obtained from the base of the skull through the vertex without intravenous contrast. RADIATION DOSE REDUCTION: This exam was performed according to the departmental dose-optimization program which includes automated exposure control, adjustment of the mA and/or kV according to patient size and/or use of iterative reconstruction technique. COMPARISON:  Head CT 04/25/2023, MRI brain 04/25/2023. FINDINGS: Brain: There is mild cerebrocerebellar atrophy, mild small-vessel disease and atrophic ventriculomegaly. No cortical based acute infarct, hemorrhage, mass, mass effect or midline shift are seen. The basal cisterns are clear. Vascular: Patchy calcific plaque both siphons, both distal vertebral arteries. No hyperdense central vessel is seen. Skull: Negative for fractures or focal lesions.  Sinuses/Orbits: Negative orbits with old lens extractions. Mild chronic disease right ethmoid sinus. Other paranasal sinuses, mastoid air cells and bilateral middle ears are clear. The nasal septum is midline. Other: None. IMPRESSION: 1. No acute intracranial CT findings. 2. Atrophy and small-vessel disease.  Stable exam. 3. Intracranial atherosclerosis. Electronically Signed   By: Denman Fischer M.D.   On: 10/14/2023 20:06    EKG: Independently reviewed.  Normal sinus rhythm.  Assessment/Plan Principal Problem:   Acute encephalopathy Active Problems:   Acute kidney injury superimposed on stage 5 chronic kidney disease, not on chronic dialysis (HCC)   Anemia of chronic disease   Hyperlipidemia   HTN (hypertension)   Anemia, chronic disease   Asthma, chronic    Possible sepsis -   since admission patient has become hypothermic and blood pressure is also in the low normal.  Concerning for developing sepsis.  X-ray shows infiltrates.  Started on broad-spectrum antibiotics cultures ordered.  Hold antihypertensives due to low normal blood pressure.  Check lactic acid levels. Acute encephalopathy/delirium could be from Valtrex toxicity or developing sepsis.  CT head unremarkable. Herpes zoster was given Valtrex presently holding because patient may have received increased dose in the setting of chronic kidney disease.  Patient is alert awake following commands denies any headache no neck rigidity no definite signs of any meningitis or encephalitis.  Will continue to monitor. Chronic kidney disease stage V with metabolic acidosis takes torsemide  and bicarbonate.  Hold torsemide  due to low normal blood pressure. Hypertension takes hydralazine  amlodipine  and Coreg  which I am holding due to low normal blood pressure.  Follow-up blood pressure trends. History of CHF takes torsemide  holding torsemide  due to low normal blood pressure. Anemia due to renal disease follow CBC.  On iron and B12  supplements. History of asthma presently not wheezing.  Continue home inhalers. History of anxiety and depression on Celexa  and as needed Ativan .  Since patient has possible developing sepsis with hypothermia hypotension encephalopathy will need close monitoring further workup and more than 2 midnight stay.   DVT prophylaxis: Heparin . Code Status: Full code. Family Communication: Patient's family at the bedside. Disposition Plan: Stepdown. Consults called: None. Admission status: Inpatient.

## 2023-10-14 NOTE — ED Notes (Signed)
 Food and drink offered but pt. Declined stated, "I'm not hungry."

## 2023-10-14 NOTE — ED Triage Notes (Signed)
 Pt. BIBEMS from home/ w/ c/o nausea. Per EMS pt. Dx w/ shingles yesterday and took Valtrex. Pt. A&O x 4  and slow to respond at baseline. Hx HTN. No medications taken today.

## 2023-10-15 ENCOUNTER — Inpatient Hospital Stay (HOSPITAL_COMMUNITY)

## 2023-10-15 ENCOUNTER — Telehealth: Payer: Self-pay | Admitting: *Deleted

## 2023-10-15 DIAGNOSIS — Z86718 Personal history of other venous thrombosis and embolism: Secondary | ICD-10-CM | POA: Diagnosis not present

## 2023-10-15 DIAGNOSIS — B021 Zoster meningitis: Secondary | ICD-10-CM | POA: Diagnosis not present

## 2023-10-15 DIAGNOSIS — Z7409 Other reduced mobility: Secondary | ICD-10-CM | POA: Diagnosis not present

## 2023-10-15 DIAGNOSIS — E1122 Type 2 diabetes mellitus with diabetic chronic kidney disease: Secondary | ICD-10-CM | POA: Diagnosis not present

## 2023-10-15 DIAGNOSIS — I38 Endocarditis, valve unspecified: Secondary | ICD-10-CM | POA: Diagnosis not present

## 2023-10-15 DIAGNOSIS — R569 Unspecified convulsions: Secondary | ICD-10-CM | POA: Diagnosis not present

## 2023-10-15 DIAGNOSIS — T68XXXA Hypothermia, initial encounter: Secondary | ICD-10-CM

## 2023-10-15 DIAGNOSIS — R6520 Severe sepsis without septic shock: Secondary | ICD-10-CM | POA: Diagnosis not present

## 2023-10-15 DIAGNOSIS — E872 Acidosis, unspecified: Secondary | ICD-10-CM | POA: Diagnosis not present

## 2023-10-15 DIAGNOSIS — N179 Acute kidney failure, unspecified: Secondary | ICD-10-CM | POA: Diagnosis not present

## 2023-10-15 DIAGNOSIS — E782 Mixed hyperlipidemia: Secondary | ICD-10-CM | POA: Diagnosis not present

## 2023-10-15 DIAGNOSIS — I132 Hypertensive heart and chronic kidney disease with heart failure and with stage 5 chronic kidney disease, or end stage renal disease: Secondary | ICD-10-CM | POA: Diagnosis not present

## 2023-10-15 DIAGNOSIS — B028 Zoster with other complications: Secondary | ICD-10-CM

## 2023-10-15 DIAGNOSIS — Z7401 Bed confinement status: Secondary | ICD-10-CM | POA: Diagnosis not present

## 2023-10-15 DIAGNOSIS — T375X5A Adverse effect of antiviral drugs, initial encounter: Secondary | ICD-10-CM

## 2023-10-15 DIAGNOSIS — G9340 Encephalopathy, unspecified: Secondary | ICD-10-CM | POA: Diagnosis not present

## 2023-10-15 DIAGNOSIS — Y92009 Unspecified place in unspecified non-institutional (private) residence as the place of occurrence of the external cause: Secondary | ICD-10-CM | POA: Diagnosis not present

## 2023-10-15 DIAGNOSIS — R2689 Other abnormalities of gait and mobility: Secondary | ICD-10-CM | POA: Diagnosis not present

## 2023-10-15 DIAGNOSIS — E785 Hyperlipidemia, unspecified: Secondary | ICD-10-CM | POA: Diagnosis present

## 2023-10-15 DIAGNOSIS — R531 Weakness: Secondary | ICD-10-CM

## 2023-10-15 DIAGNOSIS — Z8249 Family history of ischemic heart disease and other diseases of the circulatory system: Secondary | ICD-10-CM | POA: Diagnosis not present

## 2023-10-15 DIAGNOSIS — G928 Other toxic encephalopathy: Secondary | ICD-10-CM | POA: Diagnosis not present

## 2023-10-15 DIAGNOSIS — F32A Depression, unspecified: Secondary | ICD-10-CM | POA: Diagnosis present

## 2023-10-15 DIAGNOSIS — Z743 Need for continuous supervision: Secondary | ICD-10-CM | POA: Diagnosis not present

## 2023-10-15 DIAGNOSIS — B954 Other streptococcus as the cause of diseases classified elsewhere: Secondary | ICD-10-CM | POA: Diagnosis not present

## 2023-10-15 DIAGNOSIS — A419 Sepsis, unspecified organism: Secondary | ICD-10-CM

## 2023-10-15 DIAGNOSIS — E8809 Other disorders of plasma-protein metabolism, not elsewhere classified: Secondary | ICD-10-CM | POA: Diagnosis not present

## 2023-10-15 DIAGNOSIS — Z741 Need for assistance with personal care: Secondary | ICD-10-CM | POA: Diagnosis not present

## 2023-10-15 DIAGNOSIS — N184 Chronic kidney disease, stage 4 (severe): Secondary | ICD-10-CM | POA: Diagnosis not present

## 2023-10-15 DIAGNOSIS — G319 Degenerative disease of nervous system, unspecified: Secondary | ICD-10-CM | POA: Diagnosis not present

## 2023-10-15 DIAGNOSIS — D631 Anemia in chronic kidney disease: Secondary | ICD-10-CM | POA: Diagnosis not present

## 2023-10-15 DIAGNOSIS — T375X1A Poisoning by antiviral drugs, accidental (unintentional), initial encounter: Secondary | ICD-10-CM | POA: Diagnosis present

## 2023-10-15 DIAGNOSIS — D638 Anemia in other chronic diseases classified elsewhere: Secondary | ICD-10-CM | POA: Diagnosis not present

## 2023-10-15 DIAGNOSIS — R29810 Facial weakness: Secondary | ICD-10-CM | POA: Diagnosis not present

## 2023-10-15 DIAGNOSIS — I509 Heart failure, unspecified: Secondary | ICD-10-CM | POA: Diagnosis not present

## 2023-10-15 DIAGNOSIS — I517 Cardiomegaly: Secondary | ICD-10-CM | POA: Diagnosis not present

## 2023-10-15 DIAGNOSIS — G253 Myoclonus: Secondary | ICD-10-CM | POA: Diagnosis not present

## 2023-10-15 DIAGNOSIS — R110 Nausea: Secondary | ICD-10-CM | POA: Diagnosis not present

## 2023-10-15 DIAGNOSIS — R443 Hallucinations, unspecified: Secondary | ICD-10-CM | POA: Diagnosis present

## 2023-10-15 DIAGNOSIS — N185 Chronic kidney disease, stage 5: Secondary | ICD-10-CM

## 2023-10-15 DIAGNOSIS — I6782 Cerebral ischemia: Secondary | ICD-10-CM | POA: Diagnosis not present

## 2023-10-15 DIAGNOSIS — Z79899 Other long term (current) drug therapy: Secondary | ICD-10-CM | POA: Diagnosis not present

## 2023-10-15 DIAGNOSIS — A408 Other streptococcal sepsis: Secondary | ICD-10-CM | POA: Diagnosis not present

## 2023-10-15 DIAGNOSIS — B029 Zoster without complications: Secondary | ICD-10-CM | POA: Diagnosis not present

## 2023-10-15 DIAGNOSIS — B020 Zoster encephalitis: Secondary | ICD-10-CM | POA: Diagnosis not present

## 2023-10-15 DIAGNOSIS — F419 Anxiety disorder, unspecified: Secondary | ICD-10-CM | POA: Diagnosis present

## 2023-10-15 DIAGNOSIS — R918 Other nonspecific abnormal finding of lung field: Secondary | ICD-10-CM | POA: Diagnosis not present

## 2023-10-15 DIAGNOSIS — Q828 Other specified congenital malformations of skin: Secondary | ICD-10-CM | POA: Diagnosis not present

## 2023-10-15 DIAGNOSIS — J9600 Acute respiratory failure, unspecified whether with hypoxia or hypercapnia: Secondary | ICD-10-CM | POA: Diagnosis not present

## 2023-10-15 DIAGNOSIS — Z880 Allergy status to penicillin: Secondary | ICD-10-CM | POA: Diagnosis not present

## 2023-10-15 DIAGNOSIS — R29818 Other symptoms and signs involving the nervous system: Secondary | ICD-10-CM | POA: Diagnosis not present

## 2023-10-15 DIAGNOSIS — M6281 Muscle weakness (generalized): Secondary | ICD-10-CM | POA: Diagnosis not present

## 2023-10-15 DIAGNOSIS — I10 Essential (primary) hypertension: Secondary | ICD-10-CM | POA: Diagnosis not present

## 2023-10-15 DIAGNOSIS — J45909 Unspecified asthma, uncomplicated: Secondary | ICD-10-CM | POA: Diagnosis not present

## 2023-10-15 DIAGNOSIS — I5032 Chronic diastolic (congestive) heart failure: Secondary | ICD-10-CM | POA: Diagnosis not present

## 2023-10-15 DIAGNOSIS — Z886 Allergy status to analgesic agent status: Secondary | ICD-10-CM | POA: Diagnosis not present

## 2023-10-15 DIAGNOSIS — Z888 Allergy status to other drugs, medicaments and biological substances status: Secondary | ICD-10-CM | POA: Diagnosis not present

## 2023-10-15 DIAGNOSIS — M6259 Muscle wasting and atrophy, not elsewhere classified, multiple sites: Secondary | ICD-10-CM | POA: Diagnosis not present

## 2023-10-15 DIAGNOSIS — I6783 Posterior reversible encephalopathy syndrome: Secondary | ICD-10-CM | POA: Diagnosis not present

## 2023-10-15 HISTORY — DX: Sepsis, unspecified organism: A41.9

## 2023-10-15 HISTORY — DX: Hypothermia, initial encounter: T68.XXXA

## 2023-10-15 LAB — COMPREHENSIVE METABOLIC PANEL WITH GFR
ALT: 8 U/L (ref 0–44)
AST: 16 U/L (ref 15–41)
Albumin: 2.7 g/dL — ABNORMAL LOW (ref 3.5–5.0)
Alkaline Phosphatase: 37 U/L — ABNORMAL LOW (ref 38–126)
Anion gap: 12 (ref 5–15)
BUN: 80 mg/dL — ABNORMAL HIGH (ref 8–23)
CO2: 19 mmol/L — ABNORMAL LOW (ref 22–32)
Calcium: 8.2 mg/dL — ABNORMAL LOW (ref 8.9–10.3)
Chloride: 100 mmol/L (ref 98–111)
Creatinine, Ser: 5.64 mg/dL — ABNORMAL HIGH (ref 0.44–1.00)
GFR, Estimated: 7 mL/min — ABNORMAL LOW (ref >60)
Glucose, Bld: 133 mg/dL — ABNORMAL HIGH (ref 70–99)
Potassium: 4 mmol/L (ref 3.5–5.1)
Sodium: 131 mmol/L — ABNORMAL LOW (ref 135–145)
Total Bilirubin: 0.8 mg/dL (ref 0.0–1.2)
Total Protein: 5.7 g/dL — ABNORMAL LOW (ref 6.5–8.1)

## 2023-10-15 LAB — CBC
HCT: 31.3 % — ABNORMAL LOW (ref 36.0–46.0)
HCT: 31.1 % — ABNORMAL LOW (ref 36.0–46.0)
Hemoglobin: 9.5 g/dL — ABNORMAL LOW (ref 12.0–15.0)
Hemoglobin: 9.4 g/dL — ABNORMAL LOW (ref 12.0–15.0)
MCH: 29.9 pg (ref 26.0–34.0)
MCH: 29.7 pg (ref 26.0–34.0)
MCHC: 30.4 g/dL (ref 30.0–36.0)
MCHC: 30.2 g/dL (ref 30.0–36.0)
MCV: 98.4 fL (ref 80.0–100.0)
MCV: 98.1 fL (ref 80.0–100.0)
Platelets: 195 10*3/uL (ref 150–400)
Platelets: 194 10*3/uL (ref 150–400)
RBC: 3.18 MIL/uL — ABNORMAL LOW (ref 3.87–5.11)
RBC: 3.17 MIL/uL — ABNORMAL LOW (ref 3.87–5.11)
RDW: 14.8 % (ref 11.5–15.5)
RDW: 14.7 % (ref 11.5–15.5)
WBC: 1.8 10*3/uL — ABNORMAL LOW (ref 4.0–10.5)
WBC: 1.9 10*3/uL — ABNORMAL LOW (ref 4.0–10.5)
nRBC: 0 % (ref 0.0–0.2)
nRBC: 0 % (ref 0.0–0.2)

## 2023-10-15 LAB — PROCALCITONIN: Procalcitonin: 0.22 ng/mL

## 2023-10-15 LAB — CORTISOL: Cortisol, Plasma: 28.4 ug/dL

## 2023-10-15 LAB — CREATININE, SERUM
Creatinine, Ser: 5.5 mg/dL — ABNORMAL HIGH (ref 0.44–1.00)
GFR, Estimated: 7 mL/min — ABNORMAL LOW (ref >60)

## 2023-10-15 LAB — LACTIC ACID, PLASMA: Lactic Acid, Venous: 0.6 mmol/L (ref 0.5–1.9)

## 2023-10-15 LAB — TROPONIN I (HIGH SENSITIVITY): Troponin I (High Sensitivity): 201 ng/L (ref <18)

## 2023-10-15 LAB — MRSA NEXT GEN BY PCR, NASAL: MRSA by PCR Next Gen: NOT DETECTED

## 2023-10-15 LAB — TSH: TSH: 2.412 u[IU]/mL (ref 0.350–4.500)

## 2023-10-15 MED ORDER — ORAL CARE MOUTH RINSE: 15.0000 mL | OROMUCOSAL | Status: DC | PRN

## 2023-10-15 MED ORDER — CHLORHEXIDINE GLUCONATE CLOTH 2 % EX PADS
6.0000 | MEDICATED_PAD | Freq: Every day | CUTANEOUS | Status: DC
  Administered 2023-10-15 – 2023-10-21 (×7): 6 via TOPICAL

## 2023-10-15 MED ORDER — VANCOMYCIN HCL 1250 MG/250ML IV SOLN
1250.0000 mg | Freq: Once | INTRAVENOUS | Status: AC
  Administered 2023-10-15: 1250 mg via INTRAVENOUS
  Filled 2023-10-15: qty 250

## 2023-10-15 MED ORDER — LORAZEPAM 0.5 MG PO TABS
0.5000 mg | ORAL_TABLET | Freq: Two times a day (BID) | ORAL | Status: DC | PRN
  Administered 2023-10-15 (×2): 0.5 mg via ORAL
  Filled 2023-10-15 (×2): qty 1

## 2023-10-15 MED ORDER — SODIUM CHLORIDE 0.9 % IV SOLN
100.0000 mg | Freq: Two times a day (BID) | INTRAVENOUS | Status: DC
  Administered 2023-10-15 – 2023-10-18 (×7): 100 mg via INTRAVENOUS
  Filled 2023-10-15 (×8): qty 100

## 2023-10-15 MED ORDER — VANCOMYCIN VARIABLE DOSE PER UNSTABLE RENAL FUNCTION (PHARMACIST DOSING): Status: DC

## 2023-10-15 MED ORDER — SODIUM CHLORIDE 0.9 % IV SOLN
2.0000 g | INTRAVENOUS | Status: DC
  Administered 2023-10-15: 2 g via INTRAVENOUS
  Filled 2023-10-15: qty 20

## 2023-10-15 MED ORDER — SODIUM CHLORIDE 0.9 % IV SOLN
1.0000 g | INTRAVENOUS | Status: DC
  Administered 2023-10-15 – 2023-10-16 (×2): 1 g via INTRAVENOUS
  Filled 2023-10-15 (×2): qty 10

## 2023-10-15 MED ORDER — SODIUM CHLORIDE 0.9 % IV BOLUS
250.0000 mL | Freq: Once | INTRAVENOUS | Status: AC
  Administered 2023-10-15: 250 mL via INTRAVENOUS

## 2023-10-15 MED ORDER — ONDANSETRON HCL 4 MG/2ML IJ SOLN
4.0000 mg | Freq: Once | INTRAMUSCULAR | Status: AC
  Administered 2023-10-15: 4 mg via INTRAVENOUS
  Filled 2023-10-15: qty 2

## 2023-10-15 NOTE — Progress Notes (Signed)
 PROGRESS NOTE    [name]  GEX:[id number] DOB: Nov 05, 1933 DOA: 10/14/2023 PCP: Reginal Capra, MD    Brief Narrative:  [name] is a 89 y.o. female with history of chronic kidney disease stage V, hypertension, asthma, anemia, CHF recently admitted for respiratory failure secondary to acute CHF discharge about 2 weeks ago was brought to the ER after patient's family noticed patient was getting increasingly confused.  About 2 days ago patient started developing right neck and right upper chest vesicular lesions which was getting painful and yesterday had gone to her primary care physician was prescribed Valtrex for herpes zoster.  Patient has so far taken 5 doses of Valtrex 1 g.  Patient became confused was hallucinating and was talking about things which were not there.  Patient was brought to the ER.   Assessment and Plan:  sepsis - - X-ray shows infiltrates.  - broad-spectrum antibiotics cultures ordered.    Acute encephalopathy/delirium - Delirium precautions - Suspect due to Valtrex toxicity  Herpes zoster  -was given Valtrex - Hold due to toxicity  Chronic kidney disease stage V  -with metabolic acidosis  - bicarbonate  Hypertension  - holding home meds due to low normal blood pressure  History of CHF  - holding torsemide  due to low normal blood pressure.  Anemia  -due to renal disease - follow CBC  History of asthma  -presently not wheezing.   -Continue home inhalers.  History of anxiety and depression  -on Celexa  and as needed Ativan .   DVT prophylaxis: heparin  injection 5,000 Units Start: 10/15/23 0600    Code Status: Full Code Family Communication: At bedside  Disposition Plan:  Level of care: Stepdown Status is: Inpatient       Subjective: Sleeping, family said that patient was very confused and combative prior to falling asleep  Objective: Vitals:   10/15/23 0543 10/15/23 0700 10/15/23 0715 10/15/23 0815  BP: (!) 111/38 (!)  136/46 (!) 121/44 (!) 134/56  Pulse: 67 73 67 74  Resp: 17 19 (!) 26 20  Temp:  (!) 97.5 F (36.4 C)    TempSrc:  Rectal    SpO2: 97% 96% 97% 97%  Weight:      Height:        Intake/Output Summary (Last 24 hours) at 10/15/2023 1050 Last data filed at 10/15/2023 1020 Gross per 24 hour  Intake 1404.41 ml  Output --  Net 1404.41 ml   Filed Weights   10/14/23 1548  Weight: 61.2 kg    Examination:   General: Appearance:  Chronically ill female in no acute distress     Lungs:     respirations unlabored  Heart:    Normal heart rate.    MS:   All extremities are intact.    Neurologic: Currently sleeping       Data Reviewed: I have personally reviewed following labs and imaging studies  CBC: Recent Labs  Lab 10/09/23 1351 10/14/23 0233 10/14/23 1720 10/15/23 0232  WBC 4.3 1.8* 2.7* 1.9*  NEUTROABS 2.8  --   --   --   HGB 10.3* 9.5* 9.9* 9.4*  HCT 33.1* 31.3* 32.8* 31.1*  MCV 94.3 98.4 98.2 98.1  PLT 291 195 202 194   Basic Metabolic Panel: Recent Labs  Lab 10/14/23 1720 10/14/23 2309 10/15/23 0232  NA 134*  --  131*  K 4.3  --  4.0  CL 100  --  100  CO2 20*  --  19*  GLUCOSE  130*  --  133*  BUN 80*  --  80*  CREATININE 5.97* 5.50* 5.64*  CALCIUM  8.4*  --  8.2*   GFR: Estimated Creatinine Clearance: 6 mL/min (A) (by C-G formula based on SCr of 5.64 mg/dL (H)). Liver Function Tests: Recent Labs  Lab 10/14/23 1720 10/15/23 0232  AST 21 16  ALT 8 8  ALKPHOS 40 37*  BILITOT 0.7 0.8  PROT 6.2* 5.7*  ALBUMIN  2.9* 2.7*   No results for input(s): "LIPASE", "AMYLASE" in the last 168 hours. No results for input(s): "AMMONIA" in the last 168 hours. Coagulation Profile: No results for input(s): "INR", "PROTIME" in the last 168 hours. Cardiac Enzymes: No results for input(s): "CKTOTAL", "CKMB", "CKMBINDEX", "TROPONINI" in the last 168 hours. BNP (last 3 results) Recent Labs    09/22/23 0036  PROBNP >35,000.0*   HbA1C: No results for input(s):  "HGBA1C" in the last 72 hours. CBG: No results for input(s): "GLUCAP" in the last 168 hours. Lipid Profile: No results for input(s): "CHOL", "HDL", "LDLCALC", "TRIG", "CHOLHDL", "LDLDIRECT" in the last 72 hours. Thyroid  Function Tests: Recent Labs    10/15/23 0200  TSH 2.412   Anemia Panel: No results for input(s): "VITAMINB12", "FOLATE", "FERRITIN", "TIBC", "IRON", "RETICCTPCT" in the last 72 hours. Sepsis Labs: Recent Labs  Lab 10/15/23 0232  PROCALCITON 0.22    No results found for this or any previous visit (from the past 240 hours).       Radiology Studies: DG ABD ACUTE 2+V W 1V CHEST Result Date: 10/15/2023 EXAM: SUPINE XRAY 1 VIEW(S) OF THE ABDOMEN AND 1 VIEW(S) OF THE CHEST 10/15/2023 03:18:15 AM COMPARISON: CT chest dated 09/22/2023. CLINICAL HISTORY: 315176 Nausea \\T \ vomiting 177057. N\T\V FINDINGS: LUNGS AND PLEURA: Residual mild patchy right upper lobe opacity, suspicious for pneumonia, although improved. No definite pleural effusions. HEART AND MEDIASTINUM: Cardiomegaly. BOWEL: No bowel obstruction. PERITONEUM AND SOFT TISSUES: No abnormal calcifications. No free air. BONES: No acute osseous abnormality. IMPRESSION: 1. Residual mild patchy right upper lobe opacity, suspicious for pneumonia, although improved. 2. Cardiomegaly. Electronically signed by: Zadie Herter MD 10/15/2023 03:22 AM EDT RP Workstation: [id number]   CT Head Wo Contrast Result Date: 10/14/2023 CLINICAL DATA:  Mental status changes, unknown cause, with nausea. Recently began medication for shingles outbreak. EXAM: CT HEAD WITHOUT CONTRAST TECHNIQUE: Contiguous axial images were obtained from the base of the skull through the vertex without intravenous contrast. RADIATION DOSE REDUCTION: This exam was performed according to the departmental dose-optimization program which includes automated exposure control, adjustment of the mA and/or kV according to patient size and/or use of iterative reconstruction  technique. COMPARISON:  Head CT 04/25/2023, MRI brain 04/25/2023. FINDINGS: Brain: There is mild cerebrocerebellar atrophy, mild small-vessel disease and atrophic ventriculomegaly. No cortical based acute infarct, hemorrhage, mass, mass effect or midline shift are seen. The basal cisterns are clear. Vascular: Patchy calcific plaque both siphons, both distal vertebral arteries. No hyperdense central vessel is seen. Skull: Negative for fractures or focal lesions. Sinuses/Orbits: Negative orbits with old lens extractions. Mild chronic disease right ethmoid sinus. Other paranasal sinuses, mastoid air cells and bilateral middle ears are clear. The nasal septum is midline. Other: None. IMPRESSION: 1. No acute intracranial CT findings. 2. Atrophy and small-vessel disease.  Stable exam. 3. Intracranial atherosclerosis. Electronically Signed   By: Denman Fischer M.D.   On: 10/14/2023 20:06        Scheduled Meds:  citalopram   10 mg Oral QHS   cyanocobalamin   1,000 mcg Oral Daily  ferrous sulfate   325 mg Oral Q breakfast   heparin   5,000 Units Subcutaneous Q8H   lidocaine   1 patch Transdermal Q24H   montelukast   10 mg Oral QHS   sodium bicarbonate   650 mg Oral BID   vancomycin  variable dose per unstable renal function (pharmacist dosing)   Does not apply See admin instructions   Continuous Infusions:  ceFEPime (MAXIPIME) IV Stopped (10/15/23 1020)   doxycycline  (VIBRAMYCIN ) IV Stopped (10/15/23 0700)     LOS: 0 days    Time spent: 45 minutes spent on chart review, discussion with nursing staff, consultants, updating family and interview/physical exam; more than 50% of that time was spent in counseling and/or coordination of care.    Enrigue Harvard, DO [facility] Available via Epic secure chat 7am-7pm After these hours, please refer to coverage provider listed on [email or web address] 10/15/2023, 10:50 AM

## 2023-10-15 NOTE — Telephone Encounter (Signed)
 Copied from CRM [id number]. Topic: Clinical - Home Health Verbal Orders >> Oct 15, 2023 11:16 AM Melissa C wrote: Caller/Agency: Hinton Luis with [facility] Number: [phone] Service Requested: Physical Therapy Frequency: n/a Any new concerns about the patient? Yes, therapist calling as patient will be having a missed visit this week due to an outbreak of shingles.

## 2023-10-15 NOTE — ED Notes (Signed)
 ED TO INPATIENT HANDOFF REPORT  Name/Age/Gender [name] 89 y.o. female  Code Status    Code Status Orders  (From admission, onward)           Start     Ordered   10/14/23 2309  Full code  Continuous       Question:  By:  Answer:  Consent: discussion documented in EHR   10/14/23 2309           Code Status History     Date Active Date Inactive Code Status Order ID Comments User Context   09/22/2023 0843 09/26/2023 2054 Full Code [id number]  Mandy Second, MD Inpatient   08/08/2023 0424 08/13/2023 2241 Full Code [id number]  Lanetta Pion, MD ED   10/31/2022 0812 11/06/2022 1629 Full Code [id number]  Lena Qualia, MD Inpatient   10/31/2022 0350 10/31/2022 0812 Full Code [id number]  Roxana Copier, DO ED   11/03/2018 0156 11/04/2018 1555 Full Code [id number]  Juliette Oh, MD ED   09/08/2012 2245 09/09/2012 1830 Full Code [id number]  Tyra Galley, MD Inpatient       Home/SNF/Other Home  Chief Complaint Acute encephalopathy [G93.40] Hypothermia [T68.XXXA]  Level of Care/Admitting Diagnosis ED Disposition     ED Disposition  Admit   Condition  --   Comment  Hospital Area: [facility] [[id number]]  Level of Care: Stepdown [14]  Admit to SDU based on following criteria: Severe physiological/psychological symptoms:  Any diagnosis requiring assessment & intervention at least every 4 hours on an ongoing basis to obtain desired patient outcomes including stability and rehabilitation  May admit patient to [facility] or [facility] if equivalent level of care is available:: No  Covid Evaluation: Asymptomatic - no recent exposure (last 10 days) testing not required  Diagnosis: Hypothermia [742595]  Admitting Physician: Angelene Kelly [phone]  Attending Physician: Angelene Kelly [phone]  Certification:: I certify this patient will need inpatient services for at least 2 midnights  Expected Medical Readiness: 10/17/2023           Medical History Past Medical History:  Diagnosis Date   Acute superficial venous thrombosis of left lower extremity 03/27/2014   concern about hx and potential extensive on exam tenderness calf and low dose lovenox  40 qd 2-4 weekselevetion and close  fu advised .    Allergy    Anemia    Anxiety    Arthritis    "shoulders" (09/09/2012)   Asthma 09/08/2012   Carotid bruit    us  2018  low risk 1 - 39%   Cataract    bil cateracts removed   CKD (chronic kidney disease) stage 5, GFR less than 15 ml/min (HCC)    as of may 2025, creat 4.5- 5.5 range   Clotting disorder (HCC)    blood clots in legs   Diabetes mellitus without complication (HCC)    "Borderline" per pt; being monitored.  no meds per pt   Diverticulosis    Dyspnea 04/27/2014   GERD (gastroesophageal reflux disease)    Headache(784.0)    "related to my high blood pressure" (09/09/2012)   Hiatal hernia    History of DVT (deep vein thrombosis)    "RLE" (09/09/2012) coumadine cant take asa so on plavix     HTN (hypertension) 09/08/2012   Hyperlipidemia    Lupus    "cured years ago" (09/09/2012)   Other dysphagia 02/11/2013   Syncope and collapse 01/21/2018   Varicose veins    Varicose  veins of leg with complications 12/05/2010    Allergies Allergies  Allergen Reactions   Penicillins Nausea And Vomiting   Asa [Aspirin] Other (See Comments)    Wheezing Acetaminophen  is OK    Fish Allergy Itching, Nausea And Vomiting and Swelling    Swelling of the face    IV Location/Drains/Wounds Patient Lines/Drains/Airways Status     Active Line/Drains/Airways     Name Placement date Placement time Site Days   Peripheral IV 10/14/23 20 G Anterior;Distal;Left;Upper Arm 10/14/23  1716  Arm  1            Labs/Imaging Results for orders placed or performed during the hospital encounter of 10/14/23 (from the past 48 hours)  CBC     Status: Abnormal   Collection Time: 10/14/23  2:33 AM  Result Value Ref Range   WBC  1.8 (L) 4.0 - 10.5 K/uL   RBC 3.18 (L) 3.87 - 5.11 MIL/uL   Hemoglobin 9.5 (L) 12.0 - 15.0 g/dL   HCT 16.1 (L) 09.6 - 04.5 %   MCV 98.4 80.0 - 100.0 fL   MCH 29.9 26.0 - 34.0 pg   MCHC 30.4 30.0 - 36.0 g/dL   RDW 40.9 81.1 - 91.4 %   Platelets 195 150 - 400 K/uL   nRBC 0.0 0.0 - 0.2 %    Comment: Performed at [facility], [address] [place], [place] [address]  CBC     Status: Abnormal   Collection Time: 10/14/23  5:20 PM  Result Value Ref Range   WBC 2.7 (L) 4.0 - 10.5 K/uL   RBC 3.34 (L) 3.87 - 5.11 MIL/uL   Hemoglobin 9.9 (L) 12.0 - 15.0 g/dL   HCT 62.1 (L) 30.8 - 65.7 %   MCV 98.2 80.0 - 100.0 fL   MCH 29.6 26.0 - 34.0 pg   MCHC 30.2 30.0 - 36.0 g/dL   RDW 84.6 96.2 - 95.2 %   Platelets 202 150 - 400 K/uL   nRBC 0.0 0.0 - 0.2 %    Comment: Performed at [facility], [address], [place], [place] [address]  Comprehensive metabolic panel with GFR     Status: Abnormal   Collection Time: 10/14/23  5:20 PM  Result Value Ref Range   Sodium 134 (L) 135 - 145 mmol/L   Potassium 4.3 3.5 - 5.1 mmol/L   Chloride 100 98 - 111 mmol/L   CO2 20 (L) 22 - 32 mmol/L   Glucose, Bld 130 (H) 70 - 99 mg/dL    Comment: Glucose reference range applies only to samples taken after fasting for at least 8 hours.   BUN 80 (H) 8 - 23 mg/dL   Creatinine, Ser 4.40 (H) 0.44 - 1.00 mg/dL   Calcium  8.4 (L) 8.9 - 10.3 mg/dL   Total Protein 6.2 (L) 6.5 - 8.1 g/dL   Albumin  2.9 (L) 3.5 - 5.0 g/dL   AST 21 15 - 41 U/L   ALT 8 0 - 44 U/L   Alkaline Phosphatase 40 38 - 126 U/L   Total Bilirubin 0.7 0.0 - 1.2 mg/dL   GFR, Estimated 6 (L) >60 mL/min    Comment: (NOTE) Calculated using the CKD-EPI Creatinine Equation (2021)    Anion gap 14 5 - 15    Comment: Performed at [facility], [address] [place], [place] [address]  Urinalysis, Routine w reflex microscopic -Urine, Catheterized     Status: Abnormal   Collection Time:  10/14/23  5:20  PM  Result Value Ref Range   Color, Urine YELLOW YELLOW   APPearance CLEAR CLEAR   Specific Gravity, Urine 1.013 1.005 - 1.030   pH 6.0 5.0 - 8.0   Glucose, UA 50 (A) NEGATIVE mg/dL   Hgb urine dipstick NEGATIVE NEGATIVE   Bilirubin Urine NEGATIVE NEGATIVE   Ketones, ur NEGATIVE NEGATIVE mg/dL   Protein, ur >=562 (A) NEGATIVE mg/dL   Nitrite NEGATIVE NEGATIVE   Leukocytes,Ua NEGATIVE NEGATIVE   RBC / HPF 0-5 0 - 5 RBC/hpf   WBC, UA 0-5 0 - 5 WBC/hpf   Bacteria, UA NONE SEEN NONE SEEN   Squamous Epithelial / HPF 0-5 0 - 5 /HPF   Mucus PRESENT     Comment: Performed at [facility], [address], [place], [place] [address]  Creatinine, serum     Status: Abnormal   Collection Time: 10/14/23 11:09 PM  Result Value Ref Range   Creatinine, Ser 5.50 (H) 0.44 - 1.00 mg/dL   GFR, Estimated 7 (L) >60 mL/min    Comment: (NOTE) Calculated using the CKD-EPI Creatinine Equation (2021) Performed at [facility], [address] [place], [place] [address]   TSH     Status: None   Collection Time: 10/15/23  2:00 AM  Result Value Ref Range   TSH 2.412 0.350 - 4.500 uIU/mL    Comment: Performed by a 3rd Generation assay with a functional sensitivity of <=0.01 uIU/mL. Performed at [facility], [address] [place], [place] [address]   CBC     Status: Abnormal   Collection Time: 10/15/23  2:32 AM  Result Value Ref Range   WBC 1.9 (L) 4.0 - 10.5 K/uL   RBC 3.17 (L) 3.87 - 5.11 MIL/uL   Hemoglobin 9.4 (L) 12.0 - 15.0 g/dL   HCT 28.4 (L) 13.2 - 44.0 %   MCV 98.1 80.0 - 100.0 fL   MCH 29.7 26.0 - 34.0 pg   MCHC 30.2 30.0 - 36.0 g/dL   RDW 10.2 72.5 - 36.6 %   Platelets 194 150 - 400 K/uL   nRBC 0.0 0.0 - 0.2 %    Comment: Performed at [facility], [address], [place], [place] [address]  Comprehensive metabolic panel     Status: Abnormal   Collection Time: 10/15/23  2:32 AM  Result Value Ref Range    Sodium 131 (L) 135 - 145 mmol/L   Potassium 4.0 3.5 - 5.1 mmol/L   Chloride 100 98 - 111 mmol/L   CO2 19 (L) 22 - 32 mmol/L   Glucose, Bld 133 (H) 70 - 99 mg/dL    Comment: Glucose reference range applies only to samples taken after fasting for at least 8 hours.   BUN 80 (H) 8 - 23 mg/dL   Creatinine, Ser 7.42 (H) 0.44 - 1.00 mg/dL   Calcium  8.2 (L) 8.9 - 10.3 mg/dL   Total Protein 5.7 (L) 6.5 - 8.1 g/dL   Albumin  2.7 (L) 3.5 - 5.0 g/dL   AST 16 15 - 41 U/L   ALT 8 0 - 44 U/L   Alkaline Phosphatase 37 (L) 38 - 126 U/L   Total Bilirubin 0.8 0.0 - 1.2 mg/dL   GFR, Estimated 7 (L) >60 mL/min    Comment: (NOTE) Calculated using the CKD-EPI Creatinine Equation (2021)    Anion gap 12 5 - 15    Comment: Performed at [facility], [address] [place], [place] [address]  Cortisol     Status: None   Collection Time: 10/15/23  2:32 AM  Result Value Ref Range   Cortisol, Plasma 28.4 ug/dL    Comment: (NOTE) AM    6.7 - 22.6 ug/dL PM   <60.4       ug/dL Performed at [facility], [address], [place], [place] [address]   Procalcitonin     Status: None   Collection Time: 10/15/23  2:32 AM  Result Value Ref Range   Procalcitonin 0.22 ng/mL    Comment:        Interpretation: PCT (Procalcitonin) <= 0.5 ng/mL: Systemic infection (sepsis) is not likely. Local bacterial infection is possible. (NOTE)       Sepsis PCT Algorithm           Lower Respiratory Tract                                      Infection PCT Algorithm    ----------------------------     ----------------------------         PCT < 0.25 ng/mL                PCT < 0.10 ng/mL          Strongly encourage             Strongly discourage   discontinuation of antibiotics    initiation of antibiotics    ----------------------------     -----------------------------       PCT 0.25 - 0.50 ng/mL            PCT 0.10 - 0.25 ng/mL               OR       >80% decrease in PCT            Discourage  initiation of                                            antibiotics      Encourage discontinuation           of antibiotics    ----------------------------     -----------------------------         PCT >= 0.50 ng/mL              PCT 0.26 - 0.50 ng/mL               AND        <80% decrease in PCT             Encourage initiation of                                             antibiotics       Encourage continuation           of antibiotics    ----------------------------     -----------------------------        PCT >= 0.50 ng/mL                  PCT > 0.50 ng/mL  AND         increase in PCT                  Strongly encourage                                      initiation of antibiotics    Strongly encourage escalation           of antibiotics                                     -----------------------------                                           PCT <= 0.25 ng/mL                                                 OR                                        > 80% decrease in PCT                                      Discontinue / Do not initiate                                             antibiotics  Performed at [facility], [address], [place], [place] [address]    *Note: Due to a large number of results and/or encounters for the requested time period, some results have not been displayed. A complete set of results can be found in Results Review.   DG ABD ACUTE 2+V W 1V CHEST Result Date: 10/15/2023 EXAM: SUPINE XRAY 1 VIEW(S) OF THE ABDOMEN AND 1 VIEW(S) OF THE CHEST 10/15/2023 03:18:15 AM COMPARISON: CT chest dated 09/22/2023. CLINICAL HISTORY: [id number] Nausea \\T \ vomiting [id number]. N\T\V FINDINGS: LUNGS AND PLEURA: Residual mild patchy right upper lobe opacity, suspicious for pneumonia, although improved. No definite pleural effusions. HEART AND MEDIASTINUM: Cardiomegaly. BOWEL: No bowel obstruction. PERITONEUM AND SOFT TISSUES: No abnormal  calcifications. No free air. BONES: No acute osseous abnormality. IMPRESSION: 1. Residual mild patchy right upper lobe opacity, suspicious for pneumonia, although improved. 2. Cardiomegaly. Electronically signed by: Zadie Herter MD 10/15/2023 03:22 AM EDT RP Workstation: [id number]   CT Head Wo Contrast Result Date: 10/14/2023 CLINICAL DATA:  Mental status changes, unknown cause, with nausea. Recently began medication for shingles outbreak. EXAM: CT HEAD WITHOUT CONTRAST TECHNIQUE: Contiguous axial images were obtained from the base of the skull through the vertex without intravenous contrast. RADIATION DOSE REDUCTION: This exam was performed according to the departmental dose-optimization program which includes automated exposure control, adjustment of the mA and/or kV according to patient size and/or use of iterative reconstruction technique. COMPARISON:  Head CT 04/25/2023, MRI brain 04/25/2023. FINDINGS: Brain: There is mild cerebrocerebellar atrophy, mild small-vessel disease and atrophic ventriculomegaly. No cortical based acute infarct, hemorrhage, mass, mass effect or midline shift are seen. The basal cisterns are clear. Vascular: Patchy calcific plaque both siphons, both distal vertebral arteries. No hyperdense central vessel is seen. Skull: Negative for fractures or focal lesions. Sinuses/Orbits: Negative orbits with old lens extractions. Mild chronic disease right ethmoid sinus. Other paranasal sinuses, mastoid air cells and bilateral middle ears are clear. The nasal septum is midline. Other: None. IMPRESSION: 1. No acute intracranial CT findings. 2. Atrophy and small-vessel disease.  Stable exam. 3. Intracranial atherosclerosis. Electronically Signed   By: Denman Fischer M.D.   On: 10/14/2023 20:06    Pending Labs Unresulted Labs (From admission, onward)     Start     Ordered   10/15/23 0543  Lactic acid, plasma  (Lactic Acid)  STAT Now then every 3 hours,   R      10/15/23 0542   10/15/23  0130  Culture, blood (Routine X 2) w Reflex to ID Panel  BLOOD CULTURE X 2,   R      10/15/23 0129            Vitals/Pain Today's Vitals   10/15/23 0700 10/15/23 0715 10/15/23 0720 10/15/23 0815  BP: (!) 136/46 (!) 121/44  (!) 134/56  Pulse: 73 67  74  Resp: 19 (!) 26  20  Temp: (!) 97.5 F (36.4 C)     TempSrc: Rectal     SpO2: 96% 97%  97%  Weight:      Height:      PainSc:   0-No pain     Isolation Precautions Airborne and Contact precautions  Medications Medications  citalopram  (CELEXA ) tablet 10 mg (10 mg Oral Given 10/15/23 0006)  sodium bicarbonate  tablet 650 mg (650 mg Oral Given 10/15/23 0023)  ferrous sulfate  tablet 325 mg (has no administration in time range)  cyanocobalamin  (VITAMIN B12) tablet 1,000 mcg (has no administration in time range)  albuterol  (PROVENTIL ) (2.5 MG/3ML) 0.083% nebulizer solution 2.5 mg (has no administration in time range)  budesonide -glycopyrrolate -formoterol  (BREZTRI ) 160-9-4.8 MCG/ACT inhaler 2 puff (has no administration in time range)  montelukast  (SINGULAIR ) tablet 10 mg (10 mg Oral Given 10/15/23 0023)  lidocaine  (LIDODERM ) 5 % 1 patch (1 patch Transdermal Patient Refused/Not Given 10/15/23 0026)  heparin  injection 5,000 Units (5,000 Units Subcutaneous Given 10/15/23 0513)  acetaminophen  (TYLENOL ) tablet 650 mg (has no administration in time range)    Or  acetaminophen  (TYLENOL ) suppository 650 mg (has no administration in time range)  doxycycline  (VIBRAMYCIN ) 100 mg in sodium chloride  0.9 % 250 mL IVPB (100 mg Intravenous New Bag/Given 10/15/23 0457)  LORazepam  (ATIVAN ) tablet 0.5 mg (0.5 mg Oral Given 10/15/23 0703)  vancomycin  variable dose per unstable renal function (pharmacist dosing) (has no administration in time range)  ceFEPIme (MAXIPIME) 1 g in sodium chloride  0.9 % 100 mL IVPB (1 g Intravenous New Bag/Given 10/15/23 0851)  lactated ringers  bolus 1,000 mL (0 mLs Intravenous Stopped 10/14/23 1830)  ondansetron  (ZOFRAN ) injection 4 mg (4  mg Intravenous Given 10/14/23 1832)  ondansetron  (ZOFRAN ) injection 4 mg (4 mg Intravenous Given 10/15/23 0021)  sodium chloride  0.9 % bolus 250 mL (0 mLs Intravenous Stopped 10/15/23 0721)  vancomycin  (VANCOREADY) IVPB 1250 mg/250 mL (1,250 mg Intravenous New Bag/Given 10/15/23 0703)    Mobility walks with device

## 2023-10-15 NOTE — ED Notes (Signed)
 Nurse messaged pharmacy for missing doxycycline 

## 2023-10-15 NOTE — ED Notes (Signed)
Care handoff to [place], [place]

## 2023-10-15 NOTE — Progress Notes (Signed)
 Pharmacy Antibiotic Note  [name] is a 89 y.o. female admitted on 10/14/2023 with sepsis.  Pharmacy has been consulted for Vancomycin  and cefepime dosing.  Plan: Vancomycin  1250mg  IV x 1  Cefepime 1 gr IV q24h  Patient with variable renal function so will monitor Vancomycin  levels and redose when appropriate  Doxycycline  per MD F/u culture results & sensitivities   Height: 5\' 2"  (157.5 cm) Weight: 61.2 kg (135 lb) IBW/kg (Calculated) : 50.1  Temp (24hrs), Avg:95.5 F (35.3 C), Min:94.1 F (34.5 C), Max:97.6 F (36.4 C)  Recent Labs  Lab 10/09/23 1351 10/14/23 0233 10/14/23 1720 10/14/23 2309 10/15/23 0232  WBC 4.3 1.8* 2.7*  --  1.9*  CREATININE  --   --  5.97* 5.50* 5.64*    Estimated Creatinine Clearance: 6 mL/min (A) (by C-G formula based on SCr of 5.64 mg/dL (H)).    Allergies  Allergen Reactions   Penicillins Nausea And Vomiting   Asa [Aspirin] Other (See Comments)    Wheezing Acetaminophen  is OK    Fish Allergy Itching, Nausea And Vomiting and Swelling    Swelling of the face    Antimicrobials this admission: 6/2 cefepime >>   6/2 Doxycycline  >>   6/2 Vancomycin  >>  Dose adjustments this admission:    Microbiology results: 6/2 BCx:      Van Gelinas, PharmD, BCPS 10/15/2023 6:55 AM

## 2023-10-15 NOTE — ED Notes (Signed)
 ED TO INPATIENT HANDOFF REPORT  Name/Age/Gender [name] 89 y.o. female  Code Status    Code Status Orders  (From admission, onward)           Start     Ordered   10/14/23 2309  Full code  Continuous       Question:  By:  Answer:  Consent: discussion documented in EHR   10/14/23 2309           Code Status History     Date Active Date Inactive Code Status Order ID Comments User Context   09/22/2023 0843 09/26/2023 2054 Full Code [id number]  Mandy Second, MD Inpatient   08/08/2023 0424 08/13/2023 2241 Full Code [id number]  Lanetta Pion, MD ED   10/31/2022 0812 11/06/2022 1629 Full Code [id number]  Lena Qualia, MD Inpatient   10/31/2022 0350 10/31/2022 0812 Full Code [id number]  Roxana Copier, DO ED   11/03/2018 0156 11/04/2018 1555 Full Code [id number]  Juliette Oh, MD ED   09/08/2012 2245 09/09/2012 1830 Full Code [id number]  Tyra Galley, MD Inpatient       Home/SNF/Other Home  Chief Complaint Acute encephalopathy [G93.40] Hypothermia [T68.XXXA]  Level of Care/Admitting Diagnosis ED Disposition     ED Disposition  Admit   Condition  --   Comment  Hospital Area: [facility] [[id number]]  Level of Care: Stepdown [14]  Admit to SDU based on following criteria: Severe physiological/psychological symptoms:  Any diagnosis requiring assessment & intervention at least every 4 hours on an ongoing basis to obtain desired patient outcomes including stability and rehabilitation  May admit patient to [facility] or [facility] if equivalent level of care is available:: No  Covid Evaluation: Asymptomatic - no recent exposure (last 10 days) testing not required  Diagnosis: Hypothermia [875643]  Admitting Physician: Angelene Kelly [phone]  Attending Physician: Angelene Kelly [phone]  Certification:: I certify this patient will need inpatient services for at least 2 midnights  Expected Medical Readiness: 10/17/2023           Medical History Past Medical History:  Diagnosis Date   Acute superficial venous thrombosis of left lower extremity 03/27/2014   concern about hx and potential extensive on exam tenderness calf and low dose lovenox  40 qd 2-4 weekselevetion and close  fu advised .    Allergy    Anemia    Anxiety    Arthritis    "shoulders" (09/09/2012)   Asthma 09/08/2012   Carotid bruit    us  2018  low risk 1 - 39%   Cataract    bil cateracts removed   CKD (chronic kidney disease) stage 5, GFR less than 15 ml/min (HCC)    as of may 2025, creat 4.5- 5.5 range   Clotting disorder (HCC)    blood clots in legs   Diabetes mellitus without complication (HCC)    "Borderline" per pt; being monitored.  no meds per pt   Diverticulosis    Dyspnea 04/27/2014   GERD (gastroesophageal reflux disease)    Headache(784.0)    "related to my high blood pressure" (09/09/2012)   Hiatal hernia    History of DVT (deep vein thrombosis)    "RLE" (09/09/2012) coumadine cant take asa so on plavix     HTN (hypertension) 09/08/2012   Hyperlipidemia    Lupus    "cured years ago" (09/09/2012)   Other dysphagia 02/11/2013   Syncope and collapse 01/21/2018   Varicose veins    Varicose  veins of leg with complications 12/05/2010    Allergies Allergies  Allergen Reactions   Penicillins Nausea And Vomiting   Asa [Aspirin] Other (See Comments)    Wheezing Acetaminophen  is OK    Fish Allergy Itching, Nausea And Vomiting and Swelling    Swelling of the face    IV Location/Drains/Wounds Patient Lines/Drains/Airways Status     Active Line/Drains/Airways     Name Placement date Placement time Site Days   Peripheral IV 10/14/23 20 G Anterior;Distal;Left;Upper Arm 10/14/23  1716  Arm  1            Labs/Imaging Results for orders placed or performed during the hospital encounter of 10/14/23 (from the past 48 hours)  CBC     Status: Abnormal   Collection Time: 10/14/23  2:33 AM  Result Value Ref Range   WBC  1.8 (L) 4.0 - 10.5 K/uL   RBC 3.18 (L) 3.87 - 5.11 MIL/uL   Hemoglobin 9.5 (L) 12.0 - 15.0 g/dL   HCT 16.1 (L) 09.6 - 04.5 %   MCV 98.4 80.0 - 100.0 fL   MCH 29.9 26.0 - 34.0 pg   MCHC 30.4 30.0 - 36.0 g/dL   RDW 40.9 81.1 - 91.4 %   Platelets 195 150 - 400 K/uL   nRBC 0.0 0.0 - 0.2 %    Comment: Performed at [facility], [address] [place], [place] [address]  CBC     Status: Abnormal   Collection Time: 10/14/23  5:20 PM  Result Value Ref Range   WBC 2.7 (L) 4.0 - 10.5 K/uL   RBC 3.34 (L) 3.87 - 5.11 MIL/uL   Hemoglobin 9.9 (L) 12.0 - 15.0 g/dL   HCT 62.1 (L) 30.8 - 65.7 %   MCV 98.2 80.0 - 100.0 fL   MCH 29.6 26.0 - 34.0 pg   MCHC 30.2 30.0 - 36.0 g/dL   RDW 84.6 96.2 - 95.2 %   Platelets 202 150 - 400 K/uL   nRBC 0.0 0.0 - 0.2 %    Comment: Performed at [facility], [address], [place], [place] [address]  Comprehensive metabolic panel with GFR     Status: Abnormal   Collection Time: 10/14/23  5:20 PM  Result Value Ref Range   Sodium 134 (L) 135 - 145 mmol/L   Potassium 4.3 3.5 - 5.1 mmol/L   Chloride 100 98 - 111 mmol/L   CO2 20 (L) 22 - 32 mmol/L   Glucose, Bld 130 (H) 70 - 99 mg/dL    Comment: Glucose reference range applies only to samples taken after fasting for at least 8 hours.   BUN 80 (H) 8 - 23 mg/dL   Creatinine, Ser 4.40 (H) 0.44 - 1.00 mg/dL   Calcium  8.4 (L) 8.9 - 10.3 mg/dL   Total Protein 6.2 (L) 6.5 - 8.1 g/dL   Albumin  2.9 (L) 3.5 - 5.0 g/dL   AST 21 15 - 41 U/L   ALT 8 0 - 44 U/L   Alkaline Phosphatase 40 38 - 126 U/L   Total Bilirubin 0.7 0.0 - 1.2 mg/dL   GFR, Estimated 6 (L) >60 mL/min    Comment: (NOTE) Calculated using the CKD-EPI Creatinine Equation (2021)    Anion gap 14 5 - 15    Comment: Performed at [facility], [address] [place], [place] [address]  Urinalysis, Routine w reflex microscopic -Urine, Catheterized     Status: Abnormal   Collection Time:  10/14/23  5:20  PM  Result Value Ref Range   Color, Urine YELLOW YELLOW   APPearance CLEAR CLEAR   Specific Gravity, Urine 1.013 1.005 - 1.030   pH 6.0 5.0 - 8.0   Glucose, UA 50 (A) NEGATIVE mg/dL   Hgb urine dipstick NEGATIVE NEGATIVE   Bilirubin Urine NEGATIVE NEGATIVE   Ketones, ur NEGATIVE NEGATIVE mg/dL   Protein, ur >=161 (A) NEGATIVE mg/dL   Nitrite NEGATIVE NEGATIVE   Leukocytes,Ua NEGATIVE NEGATIVE   RBC / HPF 0-5 0 - 5 RBC/hpf   WBC, UA 0-5 0 - 5 WBC/hpf   Bacteria, UA NONE SEEN NONE SEEN   Squamous Epithelial / HPF 0-5 0 - 5 /HPF   Mucus PRESENT     Comment: Performed at [facility], [address] [place], [place] [address]  Creatinine, serum     Status: Abnormal   Collection Time: 10/14/23 11:09 PM  Result Value Ref Range   Creatinine, Ser 5.50 (H) 0.44 - 1.00 mg/dL   GFR, Estimated 7 (L) >60 mL/min    Comment: (NOTE) Calculated using the CKD-EPI Creatinine Equation (2021) Performed at [facility], [address] [place], [place] [address]   TSH     Status: None   Collection Time: 10/15/23  2:00 AM  Result Value Ref Range   TSH 2.412 0.350 - 4.500 uIU/mL    Comment: Performed by a 3rd Generation assay with a functional sensitivity of <=0.01 uIU/mL. Performed at [facility], [address] [place], [place] [address]   CBC     Status: Abnormal   Collection Time: 10/15/23  2:32 AM  Result Value Ref Range   WBC 1.9 (L) 4.0 - 10.5 K/uL   RBC 3.17 (L) 3.87 - 5.11 MIL/uL   Hemoglobin 9.4 (L) 12.0 - 15.0 g/dL   HCT 78.2 (L) 95.6 - 21.3 %   MCV 98.1 80.0 - 100.0 fL   MCH 29.7 26.0 - 34.0 pg   MCHC 30.2 30.0 - 36.0 g/dL   RDW 08.6 57.8 - 46.9 %   Platelets 194 150 - 400 K/uL   nRBC 0.0 0.0 - 0.2 %    Comment: Performed at [facility], [address] [place], [place] [address]  Comprehensive metabolic panel     Status: Abnormal   Collection Time: 10/15/23  2:32 AM  Result Value Ref Range    Sodium 131 (L) 135 - 145 mmol/L   Potassium 4.0 3.5 - 5.1 mmol/L   Chloride 100 98 - 111 mmol/L   CO2 19 (L) 22 - 32 mmol/L   Glucose, Bld 133 (H) 70 - 99 mg/dL    Comment: Glucose reference range applies only to samples taken after fasting for at least 8 hours.   BUN 80 (H) 8 - 23 mg/dL   Creatinine, Ser 8.41 (H) 0.44 - 1.00 mg/dL   Calcium  8.2 (L) 8.9 - 10.3 mg/dL   Total Protein 5.7 (L) 6.5 - 8.1 g/dL   Albumin  2.7 (L) 3.5 - 5.0 g/dL   AST 16 15 - 41 U/L   ALT 8 0 - 44 U/L   Alkaline Phosphatase 37 (L) 38 - 126 U/L   Total Bilirubin 0.8 0.0 - 1.2 mg/dL   GFR, Estimated 7 (L) >60 mL/min    Comment: (NOTE) Calculated using the CKD-EPI Creatinine Equation (2021)    Anion gap 12 5 - 15    Comment: Performed at [facility], [address] [place], [place] [address]  Cortisol     Status: None   Collection Time: 10/15/23  2:32 AM  Result Value Ref Range   Cortisol, Plasma 28.4 ug/dL    Comment: (NOTE) AM    6.7 - 22.6 ug/dL PM   <60.4       ug/dL Performed at [facility], [address], [place], [place] [address]   Procalcitonin     Status: None   Collection Time: 10/15/23  2:32 AM  Result Value Ref Range   Procalcitonin 0.22 ng/mL    Comment:        Interpretation: PCT (Procalcitonin) <= 0.5 ng/mL: Systemic infection (sepsis) is not likely. Local bacterial infection is possible. (NOTE)       Sepsis PCT Algorithm           Lower Respiratory Tract                                      Infection PCT Algorithm    ----------------------------     ----------------------------         PCT < 0.25 ng/mL                PCT < 0.10 ng/mL          Strongly encourage             Strongly discourage   discontinuation of antibiotics    initiation of antibiotics    ----------------------------     -----------------------------       PCT 0.25 - 0.50 ng/mL            PCT 0.10 - 0.25 ng/mL               OR       >80% decrease in PCT            Discourage  initiation of                                            antibiotics      Encourage discontinuation           of antibiotics    ----------------------------     -----------------------------         PCT >= 0.50 ng/mL              PCT 0.26 - 0.50 ng/mL               AND        <80% decrease in PCT             Encourage initiation of                                             antibiotics       Encourage continuation           of antibiotics    ----------------------------     -----------------------------        PCT >= 0.50 ng/mL                  PCT > 0.50 ng/mL  AND         increase in PCT                  Strongly encourage                                      initiation of antibiotics    Strongly encourage escalation           of antibiotics                                     -----------------------------                                           PCT <= 0.25 ng/mL                                                 OR                                        > 80% decrease in PCT                                      Discontinue / Do not initiate                                             antibiotics  Performed at [facility], [address] [place], [place] [address]   Lactic acid, plasma     Status: None   Collection Time: 10/15/23  3:46 PM  Result Value Ref Range   Lactic Acid, Venous 0.6 0.5 - 1.9 mmol/L    Comment: Performed at [facility], [address], [place], [place] [address]  Troponin I (High Sensitivity)     Status: Abnormal   Collection Time: 10/15/23  3:46 PM  Result Value Ref Range   Troponin I (High Sensitivity) 201 (HH) <18 ng/L    Comment: CRITICAL RESULT CALLED TO, READ BACK BY AND VERIFIED WITH MONORE, D RN AT 1657 ON 10/15/2023 BY Launa Police, K (NOTE) Elevated high sensitivity troponin I (hsTnI) values and significant  changes across serial measurements may suggest ACS but many other  chronic and acute  conditions are known to elevate hsTnI results.  Refer to the "Links" section for chest pain algorithms and additional  guidance. Performed at [facility], [address], [place], [place] [address]    *Note: Due to a large number of results and/or encounters for the requested time period, some results have not been displayed. A complete set of results can be found in Results Review.   DG ABD ACUTE 2+V W 1V CHEST Result Date: 10/15/2023 EXAM: SUPINE XRAY 1 VIEW(S) OF THE ABDOMEN AND 1 VIEW(S) OF THE CHEST 10/15/2023 03:18:15 AM COMPARISON:  CT chest dated 09/22/2023. CLINICAL HISTORY: [id number] Nausea \\T \ vomiting [id number]. N\T\V FINDINGS: LUNGS AND PLEURA: Residual mild patchy right upper lobe opacity, suspicious for pneumonia, although improved. No definite pleural effusions. HEART AND MEDIASTINUM: Cardiomegaly. BOWEL: No bowel obstruction. PERITONEUM AND SOFT TISSUES: No abnormal calcifications. No free air. BONES: No acute osseous abnormality. IMPRESSION: 1. Residual mild patchy right upper lobe opacity, suspicious for pneumonia, although improved. 2. Cardiomegaly. Electronically signed by: Zadie Herter MD 10/15/2023 03:22 AM EDT RP Workstation: [id number]   CT Head Wo Contrast Result Date: 10/14/2023 CLINICAL DATA:  Mental status changes, unknown cause, with nausea. Recently began medication for shingles outbreak. EXAM: CT HEAD WITHOUT CONTRAST TECHNIQUE: Contiguous axial images were obtained from the base of the skull through the vertex without intravenous contrast. RADIATION DOSE REDUCTION: This exam was performed according to the departmental dose-optimization program which includes automated exposure control, adjustment of the mA and/or kV according to patient size and/or use of iterative reconstruction technique. COMPARISON:  Head CT 04/25/2023, MRI brain 04/25/2023. FINDINGS: Brain: There is mild cerebrocerebellar atrophy, mild small-vessel disease and atrophic  ventriculomegaly. No cortical based acute infarct, hemorrhage, mass, mass effect or midline shift are seen. The basal cisterns are clear. Vascular: Patchy calcific plaque both siphons, both distal vertebral arteries. No hyperdense central vessel is seen. Skull: Negative for fractures or focal lesions. Sinuses/Orbits: Negative orbits with old lens extractions. Mild chronic disease right ethmoid sinus. Other paranasal sinuses, mastoid air cells and bilateral middle ears are clear. The nasal septum is midline. Other: None. IMPRESSION: 1. No acute intracranial CT findings. 2. Atrophy and small-vessel disease.  Stable exam. 3. Intracranial atherosclerosis. Electronically Signed   By: Denman Fischer M.D.   On: 10/14/2023 20:06    Pending Labs Unresulted Labs (From admission, onward)     Start     Ordered   10/16/23 0500  CBC  Tomorrow morning,   R        10/15/23 1346   10/16/23 0500  Basic metabolic panel  Tomorrow morning,   R        10/15/23 1346   10/15/23 0130  Culture, blood (Routine X 2) w Reflex to ID Panel  BLOOD CULTURE X 2,   R (with TIMED occurrences)      10/15/23 0129            Vitals/Pain Today's Vitals   10/15/23 1229 10/15/23 1515 10/15/23 1630 10/15/23 1656  BP: (!) 147/67  (!) 111/45   Pulse: 73 65 62   Resp: 20 14 14    Temp: (!) 96.4 F (35.8 C)   97.6 F (36.4 C)  TempSrc: Axillary   Oral  SpO2: 99%  96%   Weight:      Height:      PainSc:        Isolation Precautions No active isolations  Medications Medications  citalopram  (CELEXA ) tablet 10 mg (10 mg Oral Given 10/15/23 0006)  sodium bicarbonate  tablet 650 mg (650 mg Oral Patient Refused/Not Given 10/15/23 1000)  ferrous sulfate  tablet 325 mg (325 mg Oral Patient Refused/Not Given 10/15/23 1000)  cyanocobalamin  (VITAMIN B12) tablet 1,000 mcg (1,000 mcg Oral Patient Refused/Not Given 10/15/23 1000)  albuterol  (PROVENTIL ) (2.5 MG/3ML) 0.083% nebulizer solution 2.5 mg (has no administration in time range)   budesonide -glycopyrrolate -formoterol  (BREZTRI ) 160-9-4.8 MCG/ACT inhaler 2 puff (has no administration in time range)  montelukast  (SINGULAIR ) tablet 10 mg (10 mg Oral Given 10/15/23 0023)  lidocaine  (LIDODERM ) 5 % 1 patch (1 patch Transdermal Patient Refused/Not Given  10/15/23 0026)  heparin  injection 5,000 Units (5,000 Units Subcutaneous Given 10/15/23 1455)  acetaminophen  (TYLENOL ) tablet 650 mg (has no administration in time range)    Or  acetaminophen  (TYLENOL ) suppository 650 mg (has no administration in time range)  doxycycline  (VIBRAMYCIN ) 100 mg in sodium chloride  0.9 % 250 mL IVPB (100 mg Intravenous New Bag/Given 10/15/23 1806)  LORazepam  (ATIVAN ) tablet 0.5 mg (0.5 mg Oral Given 10/15/23 0703)  vancomycin  variable dose per unstable renal function (pharmacist dosing) (has no administration in time range)  ceFEPIme (MAXIPIME) 1 g in sodium chloride  0.9 % 100 mL IVPB (0 g Intravenous Stopped 10/15/23 1020)  lactated ringers  bolus 1,000 mL (0 mLs Intravenous Stopped 10/14/23 1830)  ondansetron  (ZOFRAN ) injection 4 mg (4 mg Intravenous Given 10/14/23 1832)  ondansetron  (ZOFRAN ) injection 4 mg (4 mg Intravenous Given 10/15/23 0021)  sodium chloride  0.9 % bolus 250 mL (0 mLs Intravenous Stopped 10/15/23 0721)  vancomycin  (VANCOREADY) IVPB 1250 mg/250 mL (0 mg Intravenous Stopped 10/15/23 1019)    Mobility non-ambulatory

## 2023-10-15 NOTE — Progress Notes (Signed)
 Pharmacy Antibiotic Note  [name] is a 89 y.o. female admitted on 10/14/2023 with sepsis.  Pharmacy has been consulted for Vancomycin  dosing.  Plan: Vancomycin  1250mg  IV x 1  Patient with variable renal function so will monitor Vancomycin  levels and redose when appropriate Ceftriaxone  and Doxycycline  per MD F/u culture results & sensitivities   Height: 5\' 2"  (157.5 cm) Weight: 61.2 kg (135 lb) IBW/kg (Calculated) : 50.1  Temp (24hrs), Avg:95.5 F (35.3 C), Min:94.1 F (34.5 C), Max:97.6 F (36.4 C)  Recent Labs  Lab 10/09/23 1351 10/14/23 0233 10/14/23 1720 10/14/23 2309 10/15/23 0232  WBC 4.3 1.8* 2.7*  --  1.9*  CREATININE  --   --  5.97* 5.50* 5.64*    Estimated Creatinine Clearance: 6 mL/min (A) (by C-G formula based on SCr of 5.64 mg/dL (H)).    Allergies  Allergen Reactions   Penicillins Nausea And Vomiting   Asa [Aspirin] Other (See Comments)    Wheezing Acetaminophen  is OK    Fish Allergy Itching, Nausea And Vomiting and Swelling    Swelling of the face    Antimicrobials this admission: 6/2 Ceftriaxone  >>   6/2 Doxycycline  >>   6/2 Vancomycin  >>  Dose adjustments this admission:    Microbiology results: 6/2 BCx:      Thank you for allowing pharmacy to be a part of this patient's care.  Rulon Councilman, PharmD 10/15/2023 5:59 AM

## 2023-10-16 DIAGNOSIS — B028 Zoster with other complications: Secondary | ICD-10-CM | POA: Diagnosis not present

## 2023-10-16 DIAGNOSIS — T375X5A Adverse effect of antiviral drugs, initial encounter: Secondary | ICD-10-CM | POA: Diagnosis not present

## 2023-10-16 DIAGNOSIS — R531 Weakness: Secondary | ICD-10-CM | POA: Diagnosis not present

## 2023-10-16 LAB — CBC
HCT: 33.6 % — ABNORMAL LOW (ref 36.0–46.0)
Hemoglobin: 10.1 g/dL — ABNORMAL LOW (ref 12.0–15.0)
MCH: 29.7 pg (ref 26.0–34.0)
MCHC: 30.1 g/dL (ref 30.0–36.0)
MCV: 98.8 fL (ref 80.0–100.0)
Platelets: 179 10*3/uL (ref 150–400)
RBC: 3.4 MIL/uL — ABNORMAL LOW (ref 3.87–5.11)
RDW: 14.8 % (ref 11.5–15.5)
WBC: 3 10*3/uL — ABNORMAL LOW (ref 4.0–10.5)
nRBC: 0 % (ref 0.0–0.2)

## 2023-10-16 LAB — BLOOD CULTURE ID PANEL (REFLEXED) - BCID2

## 2023-10-16 LAB — BASIC METABOLIC PANEL WITH GFR
Anion gap: 10 (ref 5–15)
BUN: 77 mg/dL — ABNORMAL HIGH (ref 8–23)
CO2: 18 mmol/L — ABNORMAL LOW (ref 22–32)
Calcium: 8.2 mg/dL — ABNORMAL LOW (ref 8.9–10.3)
Chloride: 107 mmol/L (ref 98–111)
Creatinine, Ser: 5.43 mg/dL — ABNORMAL HIGH (ref 0.44–1.00)
GFR, Estimated: 7 mL/min — ABNORMAL LOW (ref >60)
Glucose, Bld: 100 mg/dL — ABNORMAL HIGH (ref 70–99)
Potassium: 4.3 mmol/L (ref 3.5–5.1)
Sodium: 135 mmol/L (ref 135–145)

## 2023-10-16 MED ORDER — SODIUM CHLORIDE 0.9 % IV SOLN
2.0000 g | INTRAVENOUS | Status: DC
  Administered 2023-10-17 – 2023-10-22 (×6): 2 g via INTRAVENOUS
  Filled 2023-10-16 (×6): qty 20

## 2023-10-16 MED ORDER — CLONIDINE HCL 0.1 MG/24HR TD PTWK
0.1000 mg | MEDICATED_PATCH | TRANSDERMAL | Status: AC
  Administered 2023-10-16: 0.1 mg via TRANSDERMAL
  Filled 2023-10-16: qty 1

## 2023-10-16 MED ORDER — AMLODIPINE BESYLATE 10 MG PO TABS
10.0000 mg | ORAL_TABLET | Freq: Every day | ORAL | Status: DC
  Administered 2023-10-16 – 2023-10-23 (×8): 10 mg via ORAL
  Filled 2023-10-16 (×8): qty 1

## 2023-10-16 MED ORDER — NEPRO/CARBSTEADY PO LIQD
237.0000 mL | Freq: Two times a day (BID) | ORAL | Status: DC
  Administered 2023-10-19 – 2023-10-23 (×10): 237 mL via ORAL
  Filled 2023-10-16 (×16): qty 237

## 2023-10-16 MED ORDER — CLONIDINE HCL 0.1 MG PO TABS: 0.1000 mg | ORAL_TABLET | Freq: Two times a day (BID) | ORAL | Status: DC | PRN

## 2023-10-16 MED ORDER — HYDRALAZINE HCL 20 MG/ML IJ SOLN
10.0000 mg | Freq: Once | INTRAMUSCULAR | Status: AC
  Administered 2023-10-16: 10 mg via INTRAVENOUS
  Filled 2023-10-16: qty 1

## 2023-10-16 MED ORDER — HYDRALAZINE HCL 20 MG/ML IJ SOLN
10.0000 mg | INTRAMUSCULAR | Status: DC | PRN
  Administered 2023-10-16 (×4): 10 mg via INTRAVENOUS
  Filled 2023-10-16 (×4): qty 1

## 2023-10-16 MED ORDER — POLYVINYL ALCOHOL 1.4 % OP SOLN: 2.0000 [drp] | OPHTHALMIC | Status: DC | PRN

## 2023-10-16 MED ORDER — CARVEDILOL 12.5 MG PO TABS
12.5000 mg | ORAL_TABLET | Freq: Two times a day (BID) | ORAL | Status: DC
  Administered 2023-10-16 – 2023-10-24 (×17): 12.5 mg via ORAL
  Filled 2023-10-16 (×17): qty 1

## 2023-10-16 NOTE — Progress Notes (Signed)
 PHARMACY - PHYSICIAN COMMUNICATION CRITICAL VALUE ALERT - BLOOD CULTURE IDENTIFICATION (BCID)  [name] is an 89 y.o. female who presented to [facility] on 10/14/2023 with sepsis.  Assessment:  BCID Streptococcus species (no resistance) in 1/4 bottles  Name of physician (or Provider) Contacted: Denece Finger, FNP  Current antibiotics: Vancomycin , Cefepime, Doxycycline   Changes to prescribed antibiotics recommended:  No changes  Results for orders placed or performed during the hospital encounter of 10/14/23  Blood Culture ID Panel (Reflexed) (Collected: 10/15/2023  2:33 AM)  Result Value Ref Range   Enterococcus faecalis NOT DETECTED NOT DETECTED   Enterococcus Faecium NOT DETECTED NOT DETECTED   Listeria monocytogenes NOT DETECTED NOT DETECTED   Staphylococcus species NOT DETECTED NOT DETECTED   Staphylococcus aureus (BCID) NOT DETECTED NOT DETECTED   Staphylococcus epidermidis NOT DETECTED NOT DETECTED   Staphylococcus lugdunensis NOT DETECTED NOT DETECTED   Streptococcus species DETECTED (A) NOT DETECTED   Streptococcus agalactiae NOT DETECTED NOT DETECTED   Streptococcus pneumoniae NOT DETECTED NOT DETECTED   Streptococcus pyogenes NOT DETECTED NOT DETECTED   A.calcoaceticus-baumannii NOT DETECTED NOT DETECTED   Bacteroides fragilis NOT DETECTED NOT DETECTED   Enterobacterales NOT DETECTED NOT DETECTED   Enterobacter cloacae complex NOT DETECTED NOT DETECTED   Escherichia coli NOT DETECTED NOT DETECTED   Klebsiella aerogenes NOT DETECTED NOT DETECTED   Klebsiella oxytoca NOT DETECTED NOT DETECTED   Klebsiella pneumoniae NOT DETECTED NOT DETECTED   Proteus species NOT DETECTED NOT DETECTED   Salmonella species NOT DETECTED NOT DETECTED   Serratia marcescens NOT DETECTED NOT DETECTED   Haemophilus influenzae NOT DETECTED NOT DETECTED   Neisseria meningitidis NOT DETECTED NOT DETECTED   Pseudomonas aeruginosa NOT DETECTED NOT DETECTED   Stenotrophomonas maltophilia  NOT DETECTED NOT DETECTED   Candida albicans NOT DETECTED NOT DETECTED   Candida auris NOT DETECTED NOT DETECTED   Candida glabrata NOT DETECTED NOT DETECTED   Candida krusei NOT DETECTED NOT DETECTED   Candida parapsilosis NOT DETECTED NOT DETECTED   Candida tropicalis NOT DETECTED NOT DETECTED   Cryptococcus neoformans/gattii NOT DETECTED NOT DETECTED    Rulon Councilman, PharmD 10/16/2023  4:23 AM

## 2023-10-16 NOTE — Plan of Care (Signed)
  Problem: Clinical Measurements: Goal: Ability to maintain a body temperature in the normal range will improve Outcome: Not Progressing   Problem: Respiratory: Goal: Ability to maintain adequate ventilation will improve Outcome: Progressing Goal: Ability to maintain a clear airway will improve Outcome: Progressing   Problem: Clinical Measurements: Goal: Will remain free from infection Outcome: Progressing Goal: Diagnostic test results will improve Outcome: Progressing Goal: Respiratory complications will improve Outcome: Progressing   Problem: Nutrition: Goal: Adequate nutrition will be maintained Outcome: Not Progressing

## 2023-10-16 NOTE — Progress Notes (Signed)
 PT Cancellation Note  Patient Details Name: [name] MRN: [id number] DOB: [birth year]   Cancelled Treatment:    Reason Eval/Treat Not Completed: Medical issues which prohibited therapy (Per RN pt is not ready for PT 2* delirium. Will follow.)   Daymon Evans PT 10/16/2023  Acute Rehabilitation Services  Office [phone]

## 2023-10-16 NOTE — Progress Notes (Signed)
 PROGRESS NOTE    [name]  WUJ:[id number] DOB: 06-Aug-1933 DOA: 10/14/2023 PCP: Reginal Capra, MD    Brief Narrative:  [name] is a 89 y.o. female with history of chronic kidney disease stage V, hypertension, asthma, anemia, CHF recently admitted for respiratory failure secondary to acute CHF discharge about 2 weeks ago was brought to the ER after patient's family noticed patient was getting increasingly confused.  About 2 days ago patient started developing right neck and right upper chest vesicular lesions and yesterday had gone to urgent care and was prescribed Valtrex for herpes zoster.  Patient has so far taken 5 doses of Valtrex 1 g.  Patient became confused was hallucinating and was talking about things which were not there.  Patient was brought to the ER.   Assessment and Plan:  sepsis - - X-ray shows infiltrates.  - broad-spectrum antibiotics cultures ordered.  --- follow blood cultures  Acute encephalopathy/delirium - Delirium precautions - Suspect due to Valtrex toxicity  +blood cultures -?contaminant-- follow  ?Herpes zoster  -was given Valtrex at UC and dose not adjusted for renal failure - Hold due to toxicity-based on dosing and kidney function suspect she could remain confused for up to a week  Chronic kidney disease stage V  -with metabolic acidosis  - bicarbonate  Hypertension  - holding home meds due to low normal blood pressure  History of CHF  - holding torsemide --consider resumption once her oral intake improves  Anemia  -due to renal disease - follow CBC  History of asthma  -presently not wheezing.   -Continue home inhalers.  History of anxiety and depression  -on Celexa  and as needed Ativan .   DVT prophylaxis: heparin  injection 5,000 Units Start: 10/15/23 0600    Code Status: Full Code Family Communication: Called granddaughter, she is asking to be updated daily if able  Disposition Plan:  Level of care:  Stepdown Status is: Inpatient       Subjective: More awake today but unable to provide any further history -family reports that she had 2 lesions on Friday and that quickly progressed into the multiple lesions she developed on Saturday of note family says the lesions currently are smaller in size than the lesions on Saturday  Objective: Vitals:   10/16/23 1030 10/16/23 1100 10/16/23 1130 10/16/23 1200  BP: (!) 174/48 (!) 172/45 (!) 182/51 (!) 184/53  Pulse: 71 72 69 71  Resp: 20 17 20  (!) 22  Temp: 98.1 F (36.7 C) 97.9 F (36.6 C) 97.7 F (36.5 C) 97.7 F (36.5 C)  TempSrc:    Core  SpO2: 96% 98% 98% 98%  Weight:      Height:        Intake/Output Summary (Last 24 hours) at 10/16/2023 1350 Last data filed at 10/16/2023 0931 Gross per 24 hour  Intake 646.61 ml  Output 200 ml  Net 446.61 ml   Filed Weights   10/14/23 1548 10/15/23 2109  Weight: 61.2 kg 62 kg    Examination:   General: Appearance:  Chronically ill female in no acute distress        Lungs:     respirations unlabored  Heart:    Normal heart rate.    MS:   All extremities are intact.    Neurologic: More awake and interactive today       Data Reviewed: I have personally reviewed following labs and imaging studies  CBC: Recent Labs  Lab 10/09/23 1351 10/14/23 0233 10/14/23 1720 10/15/23  0865 10/16/23 0307  WBC 4.3 1.8* 2.7* 1.9* 3.0*  NEUTROABS 2.8  --   --   --   --   HGB 10.3* 9.5* 9.9* 9.4* 10.1*  HCT 33.1* 31.3* 32.8* 31.1* 33.6*  MCV 94.3 98.4 98.2 98.1 98.8  PLT 291 195 202 194 179   Basic Metabolic Panel: Recent Labs  Lab 10/14/23 1720 10/14/23 2309 10/15/23 0232 10/16/23 0307  NA 134*  --  131* 135  K 4.3  --  4.0 4.3  CL 100  --  100 107  CO2 20*  --  19* 18*  GLUCOSE 130*  --  133* 100*  BUN 80*  --  80* 77*  CREATININE 5.97* 5.50* 5.64* 5.43*  CALCIUM  8.4*  --  8.2* 8.2*   GFR: Estimated Creatinine Clearance: 6.3 mL/min (A) (by C-G formula based on SCr of 5.43  mg/dL (H)). Liver Function Tests: Recent Labs  Lab 10/14/23 1720 10/15/23 0232  AST 21 16  ALT 8 8  ALKPHOS 40 37*  BILITOT 0.7 0.8  PROT 6.2* 5.7*  ALBUMIN  2.9* 2.7*   No results for input(s): "LIPASE", "AMYLASE" in the last 168 hours. No results for input(s): "AMMONIA" in the last 168 hours. Coagulation Profile: No results for input(s): "INR", "PROTIME" in the last 168 hours. Cardiac Enzymes: No results for input(s): "CKTOTAL", "CKMB", "CKMBINDEX", "TROPONINI" in the last 168 hours. BNP (last 3 results) Recent Labs    09/22/23 0036  PROBNP >35,000.0*   HbA1C: No results for input(s): "HGBA1C" in the last 72 hours. CBG: No results for input(s): "GLUCAP" in the last 168 hours. Lipid Profile: No results for input(s): "CHOL", "HDL", "LDLCALC", "TRIG", "CHOLHDL", "LDLDIRECT" in the last 72 hours. Thyroid  Function Tests: Recent Labs    10/15/23 0200  TSH 2.412   Anemia Panel: No results for input(s): "VITAMINB12", "FOLATE", "FERRITIN", "TIBC", "IRON", "RETICCTPCT" in the last 72 hours. Sepsis Labs: Recent Labs  Lab 10/15/23 0232 10/15/23 1546  PROCALCITON 0.22  --   LATICACIDVEN  --  0.6    Recent Results (from the past 240 hours)  Culture, blood (Routine X 2) w Reflex to ID Panel     Status: None (Preliminary result)   Collection Time: 10/15/23  2:33 AM   Specimen: BLOOD  Result Value Ref Range Status   Specimen Description   Final    BLOOD BLOOD RIGHT ARM Performed at [facility], [address] [place], [place] [address]    Special Requests   Final    BOTTLES DRAWN AEROBIC AND ANAEROBIC Blood Culture adequate volume Performed at [facility], [address] [place], [place] [address]    Culture  Setup Time   Final    GRAM POSITIVE COCCI IN PAIRS IN CHAINS AEROBIC BOTTLE ONLY CRITICAL RESULT CALLED TO, READ BACK BY AND VERIFIED WITH: PHARMD L POINDEXTER 10/16/2023 @ 0409 BY AB Performed at [facility],  [address] [place], [place] [address]    Culture GRAM POSITIVE COCCI  Final   Report Status PENDING  Incomplete  Blood Culture ID Panel (Reflexed)     Status: Abnormal   Collection Time: 10/15/23  2:33 AM  Result Value Ref Range Status   Enterococcus faecalis NOT DETECTED NOT DETECTED Final   Enterococcus Faecium NOT DETECTED NOT DETECTED Final   Listeria monocytogenes NOT DETECTED NOT DETECTED Final   Staphylococcus species NOT DETECTED NOT DETECTED Final   Staphylococcus aureus (BCID) NOT DETECTED NOT DETECTED Final   Staphylococcus  epidermidis NOT DETECTED NOT DETECTED Final   Staphylococcus lugdunensis NOT DETECTED NOT DETECTED Final   Streptococcus species DETECTED (A) NOT DETECTED Final    Comment: Not Enterococcus species, Streptococcus agalactiae, Streptococcus pyogenes, or Streptococcus pneumoniae. CRITICAL RESULT CALLED TO, READ BACK BY AND VERIFIED WITH: PHARMD L POINDEXTER 10/16/2023 @ 0409 BY AB    Streptococcus agalactiae NOT DETECTED NOT DETECTED Final   Streptococcus pneumoniae NOT DETECTED NOT DETECTED Final   Streptococcus pyogenes NOT DETECTED NOT DETECTED Final   A.calcoaceticus-baumannii NOT DETECTED NOT DETECTED Final   Bacteroides fragilis NOT DETECTED NOT DETECTED Final   Enterobacterales NOT DETECTED NOT DETECTED Final   Enterobacter cloacae complex NOT DETECTED NOT DETECTED Final   Escherichia coli NOT DETECTED NOT DETECTED Final   Klebsiella aerogenes NOT DETECTED NOT DETECTED Final   Klebsiella oxytoca NOT DETECTED NOT DETECTED Final   Klebsiella pneumoniae NOT DETECTED NOT DETECTED Final   Proteus species NOT DETECTED NOT DETECTED Final   Salmonella species NOT DETECTED NOT DETECTED Final   Serratia marcescens NOT DETECTED NOT DETECTED Final   Haemophilus influenzae NOT DETECTED NOT DETECTED Final   Neisseria meningitidis NOT DETECTED NOT DETECTED Final   Pseudomonas aeruginosa NOT DETECTED NOT DETECTED Final   Stenotrophomonas maltophilia NOT DETECTED  NOT DETECTED Final   Candida albicans NOT DETECTED NOT DETECTED Final   Candida auris NOT DETECTED NOT DETECTED Final   Candida glabrata NOT DETECTED NOT DETECTED Final   Candida krusei NOT DETECTED NOT DETECTED Final   Candida parapsilosis NOT DETECTED NOT DETECTED Final   Candida tropicalis NOT DETECTED NOT DETECTED Final   Cryptococcus neoformans/gattii NOT DETECTED NOT DETECTED Final    Comment: Performed at [facility], [address], [place], [place] [address]  Culture, blood (Routine X 2) w Reflex to ID Panel     Status: None (Preliminary result)   Collection Time: 10/15/23  9:39 PM   Specimen: BLOOD  Result Value Ref Range Status   Specimen Description   Final    BLOOD BLOOD RIGHT HAND Performed at [facility], [address] [place], [place] [address]    Special Requests   Final    BOTTLES DRAWN AEROBIC AND ANAEROBIC Blood Culture results may not be optimal due to an inadequate volume of blood received in culture bottles Performed at [facility] [address], [place], [place] [address]    Culture   Final    NO GROWTH < 12 HOURS Performed at [facility], [address] [place], [place] [address]    Report Status PENDING  Incomplete  MRSA Next Gen by PCR, Nasal     Status: None   Collection Time: 10/15/23  9:52 PM   Specimen: Nasal Mucosa; Nasal Swab  Result Value Ref Range Status   MRSA by PCR Next Gen NOT DETECTED NOT DETECTED Final    Comment: (NOTE) The GeneXpert MRSA Assay (FDA approved for NASAL specimens only), is one component of a comprehensive MRSA colonization surveillance program. It is not intended to diagnose MRSA infection nor to guide or monitor treatment for MRSA infections. Test performance is not FDA approved in patients less than 29 years old. Performed at [facility], [address] [place], [place] [address]          Radiology Studies: DG ABD ACUTE 2+V W 1V  CHEST Result Date: 10/15/2023 EXAM: SUPINE XRAY 1 VIEW(S) OF THE ABDOMEN AND 1 VIEW(S) OF THE CHEST 10/15/2023 03:18:15 AM COMPARISON:  CT chest dated 09/22/2023. CLINICAL HISTORY: [id number] Nausea \\T \ vomiting [id number]. N\T\V FINDINGS: LUNGS AND PLEURA: Residual mild patchy right upper lobe opacity, suspicious for pneumonia, although improved. No definite pleural effusions. HEART AND MEDIASTINUM: Cardiomegaly. BOWEL: No bowel obstruction. PERITONEUM AND SOFT TISSUES: No abnormal calcifications. No free air. BONES: No acute osseous abnormality. IMPRESSION: 1. Residual mild patchy right upper lobe opacity, suspicious for pneumonia, although improved. 2. Cardiomegaly. Electronically signed by: Zadie Herter MD 10/15/2023 03:22 AM EDT RP Workstation: [id number]   CT Head Wo Contrast Result Date: 10/14/2023 CLINICAL DATA:  Mental status changes, unknown cause, with nausea. Recently began medication for shingles outbreak. EXAM: CT HEAD WITHOUT CONTRAST TECHNIQUE: Contiguous axial images were obtained from the base of the skull through the vertex without intravenous contrast. RADIATION DOSE REDUCTION: This exam was performed according to the departmental dose-optimization program which includes automated exposure control, adjustment of the mA and/or kV according to patient size and/or use of iterative reconstruction technique. COMPARISON:  Head CT 04/25/2023, MRI brain 04/25/2023. FINDINGS: Brain: There is mild cerebrocerebellar atrophy, mild small-vessel disease and atrophic ventriculomegaly. No cortical based acute infarct, hemorrhage, mass, mass effect or midline shift are seen. The basal cisterns are clear. Vascular: Patchy calcific plaque both siphons, both distal vertebral arteries. No hyperdense central vessel is seen. Skull: Negative for fractures or focal lesions. Sinuses/Orbits: Negative orbits with old lens extractions. Mild chronic disease right ethmoid sinus. Other paranasal sinuses, mastoid air cells and  bilateral middle ears are clear. The nasal septum is midline. Other: None. IMPRESSION: 1. No acute intracranial CT findings. 2. Atrophy and small-vessel disease.  Stable exam. 3. Intracranial atherosclerosis. Electronically Signed   By: Denman Fischer M.D.   On: 10/14/2023 20:06        Scheduled Meds:  amLODipine   10 mg Oral QHS   carvedilol   12.5 mg Oral BID   Chlorhexidine  Gluconate Cloth  6 each Topical Daily   citalopram   10 mg Oral QHS   cyanocobalamin   1,000 mcg Oral Daily   ferrous sulfate   325 mg Oral Q breakfast   heparin   5,000 Units Subcutaneous Q8H   lidocaine   1 patch Transdermal Q24H   montelukast   10 mg Oral QHS   sodium bicarbonate   650 mg Oral BID   Continuous Infusions:  [START ON 10/17/2023] cefTRIAXone  (ROCEPHIN )  IV     doxycycline  (VIBRAMYCIN ) IV Stopped (10/16/23 0736)     LOS: 1 day    Time spent: 45 minutes spent on chart review, discussion with nursing staff, consultants, updating family and interview/physical exam; more than 50% of that time was spent in counseling and/or coordination of care.    Enrigue Harvard, DO [facility] Available via Epic secure chat 7am-7pm After these hours, please refer to coverage provider listed on [email or web address] 10/16/2023, 1:50 PM

## 2023-10-16 NOTE — Plan of Care (Signed)
 PRNs given for anxiety with + effect Problem: Coping: Goal: Level of anxiety will decrease Outcome: Progressing   Problem: Elimination: Goal: Will not experience complications related to urinary retention Outcome: Progressing   Problem: Safety: Goal: Ability to remain free from injury will improve Outcome: Progressing

## 2023-10-16 NOTE — TOC Initial Note (Signed)
 Transition of Care [facility]) - Initial/Assessment Note    Patient Details  Name: [name] MRN: [id number] Date of Birth: 1934-02-20  Transition of Care [facility]) CM/SW Contact:    Tessie Fila, RN Phone Number: 10/16/2023, 3:56 PM  Clinical Narrative:                 Pt is from home with granddaughter Lynel Forester. Pt is currently receiving HH services with [facility]. She receives PT/OT/Social work per [employer] with [facility]. Will need ROC orders at DC.  Pt has equipment in the home, with no DME needs at this time. PT has been consulted, awaiting recommendations. TOC will follow.  Expected Discharge Plan: Home w Home Health Services Barriers to Discharge: Continued Medical Work up   Patient Goals and CMS Choice Patient states their goals for this hospitalization and ongoing recovery are:: To return home CMS Medicare.gov Compare Post Acute Care list provided to:: Other (Comment Required) (NA) Choice offered to / list presented to : NA [place] ownership interest in [facility].provided to:: Parent NA    Expected Discharge Plan and Services In-house Referral: NA Discharge Planning Services: CM Consult Post Acute Care Choice: Home Health Living arrangements for the past 2 months: Apartment                 DME Arranged: N/A DME Agency: NA       HH Arranged: PT, OT, Social Work [employer] Agency: [facility] Date HH Agency Contacted: 10/16/23 Time HH Agency Contacted: 1554 Representative spoke with at [facility] Agency: Brandi with [facility]  Prior Living Arrangements/Services Living arrangements for the past 2 months: Apartment Lives with:: Relatives Patient language and need for interpreter reviewed:: Yes Do you feel safe going back to the place where you live?: Yes      Need for Family Participation in Patient Care: Yes (Comment) Care giver support system in place?: Yes (comment) Current home services: DME Criminal Activity/Legal Involvement  Pertinent to Current Situation/Hospitalization: No - Comment as needed  Activities of Daily Living   ADL Screening (condition at time of admission) Independently performs ADLs?: No Does the patient have a NEW difficulty with bathing/dressing/toileting/self-feeding that is expected to last >3 days?: No Does the patient have a NEW difficulty with getting in/out of bed, walking, or climbing stairs that is expected to last >3 days?: No Does the patient have a NEW difficulty with communication that is expected to last >3 days?: No Is the patient deaf or have difficulty hearing?: No Does the patient have difficulty seeing, even when wearing glasses/contacts?: No Does the patient have difficulty concentrating, remembering, or making decisions?: Yes  Permission Sought/Granted Permission sought to share information with : Family Supports, Oceanographer granted to share information with : Yes, Verbal Permission Granted  Share Information with NAME: Odis, Wickey (Granddaughter)  [phone]  Permission granted to share info w AGENCY: [facility]        Emotional Assessment Appearance:: Other (Comment Required (UTA) Attitude/Demeanor/Rapport: Unable to Assess Affect (typically observed): Unable to Assess Orientation: : Oriented to Self, Oriented to Place Alcohol / Substance Use: Not Applicable Psych Involvement: No (comment)  Admission diagnosis:  Hypothermia [T68.XXXA] Nausea [R11.0] Acute encephalopathy [G93.40] Generalized weakness [R53.1] Herpes zoster with other complication [B02.8] Adverse effect of valaciclovir [T37.5X5A] Stage 5 chronic kidney disease not on chronic dialysis [facility]) [N18.5] Patient Active Problem List   Diagnosis Date Noted   Hypothermia 10/15/2023   Sepsis (HCC) 10/15/2023   Adverse  effect of valaciclovir 10/14/2023   Acute encephalopathy 10/14/2023   Acute exacerbation of CHF (congestive heart failure) (HCC) 09/22/2023    Acute kidney injury superimposed on stage 5 chronic kidney disease, not on chronic dialysis (HCC) 08/08/2023   Hypertensive nephrosclerosis 08/08/2023   Anemia of chronic kidney failure, stage 5 (HCC) 08/08/2023   Metabolic acidosis 08/08/2023   Acute exacerbation of moderate persistent extrinsic asthma 08/08/2023   Acute on chronic heart failure with preserved ejection fraction (HFpEF) (HCC) 08/08/2023   AKI (acute kidney injury) (HCC) 08/08/2023   CAP (community acquired pneumonia) 10/31/2022   Hypertensive urgency 10/31/2022   Moderate persistent asthma 10/31/2022   Hypomagnesemia 10/31/2022   CKD (chronic kidney disease), stage IV (HCC) 10/31/2022   Pulmonary hypertension, unspecified (HCC) 12/06/2021   Chronic cough 10/18/2021   Chronic rhinitis 11/24/2019   Nausea 11/03/2018   Elevated troponin 11/03/2018   Hyponatremia 11/03/2018   Hypertensive heart disease with hypertensive chronic kidney disease 11/30/2017   Fasting hyperglycemia 11/30/2017   Renal cyst 03/27/2016   CKD (chronic kidney disease) stage 3, GFR 30-59 ml/min (HCC) 08/23/2015   Subjective hearing change 06/29/2014   Resistant hypertension 06/29/2014   Lumbago 06/15/2014   Pre-diabetes vs early DM  06/15/2014   Dyspnea 04/27/2014   Asthma, chronic 04/13/2014   Elevated uric acid in blood 04/13/2014   Essential hypertension 03/27/2014   History of DVT (deep vein thrombosis)    Palpitations 01/05/2014   Anemia of chronic disease 18-Jan-2014   Death of family member 09/22/13   Leg edema 06/20/2013   Bereavement due to life event 04/21/2013   Other dysphagia 02/11/2013   Colon cancer screening 02/11/2013   Anxiety state 01/20/2013   Leg cramps 01/20/2013   Back pain 12/05/2012   Family hx of colon cancer 10/21/2012   Family hx of lung cancer 10/21/2012   Family hx-breast malignancy 10/21/2012   Renal insufficiency creatinine 1.3 10/21/2012   Anemia, chronic disease 10/21/2012   GERD (gastroesophageal  reflux disease) 09/08/2012   HTN (hypertension) 09/08/2012   Hyperlipidemia 09/08/2012   Varicose veins of leg with complications 12/05/2010   PCP:  Reginal Capra, MD Pharmacy:   [facility] [place], [place] - [address] [place] [address] Phone: [phone] Fax: [phone]  [facility] [address] [place] [address] Phone: [phone] Fax: [phone]     Social Drivers of Health (SDOH) Social History: SDOH Screenings   Food Insecurity: Patient Unable To Answer (10/16/2023)  Recent Concern: Food Insecurity - Food Insecurity Present (08/08/2023)  Housing: Patient Unable To Answer (10/16/2023)  Transportation Needs: Patient Unable To Answer (10/16/2023)  Utilities: Patient Unable To Answer (10/16/2023)  Alcohol Screen: Low Risk  (09/21/2023)  Depression (PHQ2-9): Low Risk  (09/21/2023)  Financial Resource Strain: Low Risk  (09/21/2023)  Physical Activity: Sufficiently Active (09/21/2023)  Social Connections: Patient Unable To Answer (10/16/2023)  Recent Concern: Social Connections - Moderately Isolated (08/08/2023)  Stress: No Stress Concern Present (09/21/2023)  Tobacco Use: Low Risk  (10/14/2023)  Health Literacy: Adequate Health Literacy (09/21/2023)   SDOH Interventions:     Readmission Risk Interventions    10/16/2023    3:51 PM 08/08/2023    3:21 PM 11/02/2022   12:32 PM  Readmission Risk Prevention Plan  Transportation Screening Complete  Complete  Medication Review (RN Care Manager) Complete Complete Complete  PCP or Specialist appointment within 3-5 days of discharge Complete Complete Complete  HRI or Home Care Consult Complete Complete Complete  SW Recovery Care/Counseling Consult Complete  Complete  Palliative Care Screening Not Applicable  Not Applicable  Skilled Nursing Facility Complete Complete Not Applicable

## 2023-10-17 ENCOUNTER — Inpatient Hospital Stay (HOSPITAL_COMMUNITY)

## 2023-10-17 ENCOUNTER — Inpatient Hospital Stay (HOSPITAL_COMMUNITY)
Admit: 2023-10-17 | Discharge: 2023-10-17 | Disposition: A | Discharge status: Home / Self Care | Attending: Family Medicine

## 2023-10-17 DIAGNOSIS — T375X5A Adverse effect of antiviral drugs, initial encounter: Secondary | ICD-10-CM | POA: Diagnosis not present

## 2023-10-17 DIAGNOSIS — I6783 Posterior reversible encephalopathy syndrome: Secondary | ICD-10-CM | POA: Diagnosis not present

## 2023-10-17 DIAGNOSIS — N179 Acute kidney failure, unspecified: Secondary | ICD-10-CM | POA: Diagnosis not present

## 2023-10-17 DIAGNOSIS — A419 Sepsis, unspecified organism: Secondary | ICD-10-CM

## 2023-10-17 DIAGNOSIS — B029 Zoster without complications: Secondary | ICD-10-CM

## 2023-10-17 DIAGNOSIS — Z79899 Other long term (current) drug therapy: Secondary | ICD-10-CM

## 2023-10-17 DIAGNOSIS — G253 Myoclonus: Secondary | ICD-10-CM | POA: Diagnosis not present

## 2023-10-17 DIAGNOSIS — I132 Hypertensive heart and chronic kidney disease with heart failure and with stage 5 chronic kidney disease, or end stage renal disease: Secondary | ICD-10-CM | POA: Diagnosis not present

## 2023-10-17 DIAGNOSIS — R6520 Severe sepsis without septic shock: Secondary | ICD-10-CM

## 2023-10-17 DIAGNOSIS — R569 Unspecified convulsions: Secondary | ICD-10-CM

## 2023-10-17 DIAGNOSIS — J9600 Acute respiratory failure, unspecified whether with hypoxia or hypercapnia: Secondary | ICD-10-CM

## 2023-10-17 DIAGNOSIS — B028 Zoster with other complications: Secondary | ICD-10-CM | POA: Diagnosis not present

## 2023-10-17 DIAGNOSIS — I509 Heart failure, unspecified: Secondary | ICD-10-CM

## 2023-10-17 LAB — CBC
HCT: 35.5 % — ABNORMAL LOW (ref 36.0–46.0)
Hemoglobin: 10.8 g/dL — ABNORMAL LOW (ref 12.0–15.0)
MCH: 29.7 pg (ref 26.0–34.0)
MCHC: 30.4 g/dL (ref 30.0–36.0)
MCV: 97.5 fL (ref 80.0–100.0)
Platelets: 204 10*3/uL (ref 150–400)
RBC: 3.64 MIL/uL — ABNORMAL LOW (ref 3.87–5.11)
RDW: 14.7 % (ref 11.5–15.5)
WBC: 3.6 10*3/uL — ABNORMAL LOW (ref 4.0–10.5)
nRBC: 0 % (ref 0.0–0.2)

## 2023-10-17 LAB — BASIC METABOLIC PANEL WITH GFR
Anion gap: 14 (ref 5–15)
BUN: 79 mg/dL — ABNORMAL HIGH (ref 8–23)
CO2: 17 mmol/L — ABNORMAL LOW (ref 22–32)
Calcium: 8.1 mg/dL — ABNORMAL LOW (ref 8.9–10.3)
Chloride: 106 mmol/L (ref 98–111)
Creatinine, Ser: 5.2 mg/dL — ABNORMAL HIGH (ref 0.44–1.00)
GFR, Estimated: 8 mL/min — ABNORMAL LOW (ref >60)
Glucose, Bld: 110 mg/dL — ABNORMAL HIGH (ref 70–99)
Potassium: 4.3 mmol/L (ref 3.5–5.1)
Sodium: 137 mmol/L (ref 135–145)

## 2023-10-17 LAB — GLUCOSE, CAPILLARY: Glucose-Capillary: 100 mg/dL — ABNORMAL HIGH (ref 70–99)

## 2023-10-17 MED ORDER — SODIUM CHLORIDE 0.9% FLUSH
3.0000 mL | Freq: Two times a day (BID) | INTRAVENOUS | Status: DC
  Administered 2023-10-17 – 2023-10-19 (×6): 10 mL via INTRAVENOUS
  Administered 2023-10-20: 3 mL via INTRAVENOUS
  Administered 2023-10-21: 10 mL via INTRAVENOUS
  Administered 2023-10-22: 3 mL via INTRAVENOUS

## 2023-10-17 MED ORDER — HYDRALAZINE HCL 20 MG/ML IJ SOLN
10.0000 mg | INTRAMUSCULAR | Status: DC | PRN
  Administered 2023-10-17 – 2023-10-19 (×6): 10 mg via INTRAVENOUS
  Filled 2023-10-17 (×4): qty 1

## 2023-10-17 MED ORDER — SODIUM CHLORIDE 0.9% FLUSH: 3.0000 mL | INTRAVENOUS | Status: DC | PRN

## 2023-10-17 MED ORDER — TORSEMIDE 20 MG PO TABS
40.0000 mg | ORAL_TABLET | Freq: Two times a day (BID) | ORAL | Status: DC
  Administered 2023-10-17 – 2023-10-24 (×14): 40 mg via ORAL
  Filled 2023-10-17 (×15): qty 2

## 2023-10-17 MED ORDER — HYDRALAZINE HCL 20 MG/ML IJ SOLN
10.0000 mg | INTRAMUSCULAR | Status: AC | PRN
  Administered 2023-10-17 (×2): 10 mg via INTRAVENOUS
  Filled 2023-10-17 (×2): qty 1

## 2023-10-17 NOTE — Progress Notes (Signed)
   10/17/23 1050  Spiritual Encounters  Type of Visit Initial  Care provided to: Pt and family  Referral source Clinical staff  Reason for visit Urgent spiritual support   Per referral from Sable Cram at ICU desk who informed me of code stroke this morning, I sought to offer support to [name] and her daughter, [name] at bedside. She is the youngest daughter of several siblings.  [name] debriefed with me about recent changes in her mother's health and her concerns over events of this morning. She shared their faith outlook and welcomed prayer and spiritual support.  I provided compassionate presence and active listening. I offered words of encouragement and urged self-care. I provided prayer at family request.  Xitlaly Ault L. Minetta Aly, M.Div [phone]

## 2023-10-17 NOTE — Evaluation (Addendum)
 Clinical/Bedside Swallow Evaluation Patient Details  Name: [name] MRN: [id number] Date of Birth: 1934/04/21  Today's Date: 10/17/2023 Time: SLP Start Time (ACUTE ONLY): 1617 SLP Stop Time (ACUTE ONLY): 1701 SLP Time Calculation (min) (ACUTE ONLY): 44 min  Past Medical History:  Past Medical History:  Diagnosis Date   Acute superficial venous thrombosis of left lower extremity 03/27/2014   concern about hx and potential extensive on exam tenderness calf and low dose lovenox  40 qd 2-4 weekselevetion and close  fu advised .    Allergy    Anemia    Anxiety    Arthritis    "shoulders" (09/09/2012)   Asthma 09/08/2012   Carotid bruit    us  2018  low risk 1 - 39%   Cataract    bil cateracts removed   CKD (chronic kidney disease) stage 5, GFR less than 15 ml/min (HCC)    as of may 2025, creat 4.5- 5.5 range   Clotting disorder (HCC)    blood clots in legs   Diabetes mellitus without complication (HCC)    "Borderline" per pt; being monitored.  no meds per pt   Diverticulosis    Dyspnea 04/27/2014   GERD (gastroesophageal reflux disease)    Headache(784.0)    "related to my high blood pressure" (09/09/2012)   Hiatal hernia    History of DVT (deep vein thrombosis)    "RLE" (09/09/2012) coumadine cant take asa so on plavix     HTN (hypertension) 09/08/2012   Hyperlipidemia    Lupus    "cured years ago" (09/09/2012)   Other dysphagia 02/11/2013   Syncope and collapse 01/21/2018   Varicose veins    Varicose veins of leg with complications 12/05/2010   Past Surgical History:  Past Surgical History:  Procedure Laterality Date   ABDOMINAL HYSTERECTOMY     partial   BREAST EXCISIONAL BIOPSY Right    BREAST SURGERY     CARPAL TUNNEL RELEASE Right 2000's   CARPAL TUNNEL RELEASE Bilateral 10/21/2013   Procedure: BILATERAL  CARPAL TUNNEL RELEASE;  Surgeon: Kemp Patter, MD;  Location: [facility];  Service: Orthopedics;  Laterality: Bilateral;   COLONOSCOPY      SHOULDER OPEN ROTATOR CUFF REPAIR Bilateral 2000's   TRIGGER FINGER RELEASE Right 10/21/2013   Procedure: RELEASE TRIGGER FINGER/A-1 PULLEY RIGHT MIDDLE AND RIGHT RING;  Surgeon: Kemp Patter, MD;  Location: [facility];  Service: Orthopedics;  Laterality: Right;   UPPER GASTROINTESTINAL ENDOSCOPY     HPI:  Per MD note "89 y.o. female with history of chronic kidney disease stage V, hypertension, asthma, anemia, CHF recently admitted for respiratory failure secondary to acute CHF discharge about 2 weeks ago was brought to the ER after patient's family noticed patient was getting increasingly confused.  About 2 days ago patient started developing right neck and right upper chest vesicular lesions and yesterday had gone to urgent care and was prescribed Valtrex for herpes zoster.  Patient has so far taken 5 doses of Valtrex 1 g.  Patient became confused was hallucinating and was talking about things which were not there."  Concern present for potential ongoing confusion due to toxicity from Valtrex - pt has renal disease.  After admit to hospital, she was noted to have facial asymmetry and CT brain, MRI conducted that were both negative.  She is on airborne precautions for shingles -  DG abdomen concerning for right lung infiltrate - although improved per notes. Pt did not pass [facility] due  to coughing - thus SLP swallow eval ordered.  Pt present and both she and family deny her having baseline dysphagia.  Neuro notes indicate pt was in hospital with CHF and is not readmitted with shingles.     Assessment / Plan / Recommendation  Clinical Impression  Patient's largest barrier to p.o. intake is her current mentation.  She is sleepy and requires total stimulation to remain alert.  No focal cranial nerve deficits noted - pt able to seal lips on spoon and straw - and patient has no prior history of dysphagia per her report and family confirms.  Generalized weakness -patient with edematous bilateral arms  but is able to raise her left arm.  Lingual bluish-greenish coating midline noted which did not clear with oral care per RN.  Recommend monitor closely.    Using hand overhand to promote neurological input and proprioception, patient accepted intake of water, ginger ale and applesauce.  Clinically judged delayed swallow with occasional subswallows observed that may indicate oral and/or pharyngeal retention.  No indication of airway compromise.  At this time due to patient's ongoing lethargy, recommend small sips of liquids and medication with applesauce or ice cream.  Will follow-up next date for readiness for full p.o. diet.  Family educated and agreeable to care plan.  Patient informed of plan but uncertain that she is able to fully comprehend current situation.  EEG staff arrived at the end of SLP session.    Anticipate patient swallow to improve consistent with her mental status.  Family reports patient received medication a few nights ago but anticipate this should not be continuing to cause her lethargy.  Family also endorses premorbid history of leg edema, reportedly due to renal disease.  SLP Visit Diagnosis: Dysphagia, unspecified (R13.10)    Aspiration Risk  Moderate aspiration risk    Diet Recommendation  (sips and medications advised)    Liquid Administration via: Cup;Straw Medication Administration:  (try with ice cream or applesauce - start and follow with liquids) Supervision: Staff to assist with self feeding Compensations: Slow rate;Small sips/bites;Other (Comment) (Sure patient swallows before give more) Postural Changes: Remain upright for at least 30 minutes after po intake;Seated upright at 90 degrees    Other  Recommendations Oral Care Recommendations: Oral care BID Caregiver Recommendations: Have oral suction available    Recommendations for follow up therapy are one component of a multi-disciplinary discharge planning process, led by the attending physician.   Recommendations may be updated based on patient status, additional functional criteria and insurance authorization.  Follow up Recommendations  (TBD)      Assistance Recommended at Discharge    Functional Status Assessment Patient has had a recent decline in their functional status and demonstrates the ability to make significant improvements in function in a reasonable and predictable amount of time.  Frequency and Duration min 1 x/week  1 week       Prognosis Prognosis for improved oropharyngeal function: Good Barriers to Reach Goals: Other (Comment);Medication (Toxicity from medication for shingles)      Swallow Study   General Date of Onset: 10/17/23 HPI: Per MD note "89 y.o. female with history of chronic kidney disease stage V, hypertension, asthma, anemia, CHF recently admitted for respiratory failure secondary to acute CHF discharge about 2 weeks ago was brought to the ER after patient's family noticed patient was getting increasingly confused.  About 2 days ago patient started developing right neck and right upper chest vesicular lesions and yesterday had gone to urgent  care and was prescribed Valtrex for herpes zoster.  Patient has so far taken 5 doses of Valtrex 1 g.  Patient became confused was hallucinating and was talking about things which were not there."  Concern present for potential ongoing confusion due to toxicity from Valtrex - pt has renal disease.  After admit to hospital, she was noted to have facial asymmetry and CT brain, MRI conducted that were both negative.  She is on airborne precautions for shingles -  DG abdomen concerning for right lung infiltrate - although improved per notes. Pt did not pass [facility] due to coughing - thus SLP swallow eval ordered.  Pt present and both she and family deny her having baseline dysphagia. Type of Study: Bedside Swallow Evaluation Diet Prior to this Study: Other (Comment);NPO Temperature Spikes Noted: No Respiratory Status: Room  air History of Recent Intubation: No Behavior/Cognition: Lethargic/Drowsy;Distractible;Requires cueing Oral Cavity Assessment: Other (comment) (green coating on tongue -) Oral Cavity - Dentition: Dentures, top;Other (Comment) (lower dentures at home and not consistently used) Self-Feeding Abilities: Total assist (pt with severe edematous arms - but able to lift her LEFT arm - she is LEFT handed) Patient Positioning: Upright in bed Baseline Vocal Quality: Low vocal intensity Volitional Cough: Weak Volitional Swallow: Able to elicit    Oral/Motor/Sensory Function Overall Oral Motor/Sensory Function: Generalized oral weakness   Ice Chips Ice chips: Impaired Presentation: Spoon Pharyngeal Phase Impairments: Suspected delayed Swallow   Thin Liquid Thin Liquid: Impaired Presentation: Straw;Cup;Spoon Pharyngeal  Phase Impairments: Suspected delayed Swallow;Multiple swallows    Nectar Thick Nectar Thick Liquid: Not tested   Honey Thick Honey Thick Liquid: Not tested   Puree Puree: Impaired Presentation: Spoon Pharyngeal Phase Impairments: Suspected delayed Swallow   Solid     Solid: Not tested Other Comments: due to pt's mentation      Chantal Comment 10/17/2023,5:16 PM  Maudie Sorrow, MS [facility] SLP Acute Rehab Services Office [phone]

## 2023-10-17 NOTE — Progress Notes (Signed)
 [facility]  PROGRESS NOTE  [name] EPP:[id number] DOB: 1934-04-19 DOA: 10/14/2023 PCP: Reginal Capra, MD   Brief HPI:   89 y.o. female with history of chronic kidney disease stage V, hypertension, asthma, anemia, CHF recently admitted for respiratory failure secondary to acute CHF discharge about 2 weeks ago was brought to the ER after patient's family noticed patient was getting increasingly confused.  About 2 days ago patient started developing right neck and right upper chest vesicular lesions and yesterday had gone to urgent care and was prescribed Valtrex for herpes zoster.  Patient has so far taken 5 doses of Valtrex 1 g.  Patient became confused was hallucinating and was talking about things which were not there.     Assessment/Plan:   Left-sided facial droop - Code stroke was called, CT head negative for stroke. - Neurology consulted, recommended MRI brain stat - MRI brain was negative  Sepsis - - X-ray showed infiltrates.  - broad-spectrum antibiotics cultures ordered.   - follow blood cultures -Continue ceftriaxone    Acute encephalopathy/delirium - Delirium precautions - Suspect due to Valtrex toxicity   +Blood cultures - 1/4 bottles growing Streptococcus para sanguinous  - Likely contamination   ?Herpes zoster  -was given Valtrex at UC and dose not adjusted for renal failure - Hold due to toxicity-based on dosing and kidney function suspect she could remain confused for up to a week   Chronic kidney disease stage V  -with metabolic acidosis  - bicarbonate   Hypertension  - Blood pressure is now elevated -Restart home medications including Coreg , amlodipine  -Start IV hydralazine  10 mg every 4 hours as needed   History of CHF  - holding torsemide --consider resumption once her oral intake improves   Anemia  -due to renal disease - follow CBC   History of asthma  -presently not wheezing.   -Continue home inhalers.   History of anxiety and  depression  -on Celexa  and as needed Ativan .    Medications     amLODipine   10 mg Oral QHS   carvedilol   12.5 mg Oral BID   Chlorhexidine  Gluconate Cloth  6 each Topical Daily   citalopram   10 mg Oral QHS   cloNIDine   0.1 mg Transdermal Weekly   cyanocobalamin   1,000 mcg Oral Daily   feeding supplement (NEPRO CARB STEADY)  237 mL Oral BID BM   ferrous sulfate   325 mg Oral Q breakfast   heparin   5,000 Units Subcutaneous Q8H   lidocaine   1 patch Transdermal Q24H   montelukast   10 mg Oral QHS   sodium bicarbonate   650 mg Oral BID     Data Reviewed:   CBG:  No results for input(s): "GLUCAP" in the last 168 hours.  SpO2: 97 %    Vitals:   10/17/23 0535 10/17/23 0545 10/17/23 0600 10/17/23 0615  BP:  (!) 180/55 (!) 174/54 (!) 169/59  Pulse: 74 72 73 72  Resp: (!) 22 19 18 20   Temp: 99 F (37.2 C) 98.8 F (37.1 C) 98.6 F (37 C) 98.6 F (37 C)  TempSrc:      SpO2: 98% 98% 97% 97%  Weight:      Height:          Data Reviewed:  Basic Metabolic Panel: Recent Labs  Lab 10/14/23 1720 10/14/23 2309 10/15/23 0232 10/16/23 0307 10/17/23 0313  NA 134*  --  131* 135 137  K 4.3  --  4.0 4.3 4.3  CL 100  --  100 107 106  CO2 20*  --  19* 18* 17*  GLUCOSE 130*  --  133* 100* 110*  BUN 80*  --  80* 77* 79*  CREATININE 5.97* 5.50* 5.64* 5.43* 5.20*  CALCIUM  8.4*  --  8.2* 8.2* 8.1*    CBC: Recent Labs  Lab 10/14/23 0233 10/14/23 1720 10/15/23 0232 10/16/23 0307 10/17/23 0313  WBC 1.8* 2.7* 1.9* 3.0* 3.6*  HGB 9.5* 9.9* 9.4* 10.1* 10.8*  HCT 31.3* 32.8* 31.1* 33.6* 35.5*  MCV 98.4 98.2 98.1 98.8 97.5  PLT 195 202 194 179 204    LFT Recent Labs  Lab 10/14/23 1720 10/15/23 0232  AST 21 16  ALT 8 8  ALKPHOS 40 37*  BILITOT 0.7 0.8  PROT 6.2* 5.7*  ALBUMIN  2.9* 2.7*     Antibiotics: Anti-infectives (From admission, onward)    Start     Dose/Rate Route Frequency Ordered Stop   10/17/23 0800  cefTRIAXone  (ROCEPHIN ) 2 g in sodium chloride  0.9 %  100 mL IVPB        2 g 200 mL/hr over 30 Minutes Intravenous Every 24 hours 10/16/23 0915     10/15/23 0800  ceFEPIme (MAXIPIME) 1 g in sodium chloride  0.9 % 100 mL IVPB  Status:  Discontinued        1 g 200 mL/hr over 30 Minutes Intravenous Every 24 hours 10/15/23 0655 10/16/23 0915   10/15/23 0606  vancomycin  variable dose per unstable renal function (pharmacist dosing)  Status:  Discontinued         Does not apply See admin instructions 10/15/23 0606 10/16/23 0915   10/15/23 0600  vancomycin  (VANCOREADY) IVPB 1250 mg/250 mL        1,250 mg 166.7 mL/hr over 90 Minutes Intravenous  Once 10/15/23 0557 10/15/23 1019   10/15/23 0400  cefTRIAXone  (ROCEPHIN ) 2 g in sodium chloride  0.9 % 100 mL IVPB  Status:  Discontinued        2 g 200 mL/hr over 30 Minutes Intravenous Every 24 hours 10/15/23 0347 10/15/23 0638   10/15/23 0400  doxycycline  (VIBRAMYCIN ) 100 mg in sodium chloride  0.9 % 250 mL IVPB        100 mg 125 mL/hr over 120 Minutes Intravenous Every 12 hours 10/15/23 0348          DVT prophylaxis: Heparin   Code Status: Full code  Family Communication: No family at bedside   CONSULTS    Subjective   This morning patient developed left-sided facial droop.  Code stroke was called.  Stat CT head was negative for stroke.  Neurology was consulted.   Objective    Physical Examination:   General: Appears lethargic, shaking movements on left side of the body Cardiovascular: S1-S2, regular Respiratory: Lungs clear to auscultation bilaterally Abdomen: Soft, nontender, no organomegaly Extremities: No edema in the lower extremities Neurologic: Alert, left-sided facial droop, very minimal, moving all extremities   Status is: Inpatient:             [name]   [facility] If 7PM-7AM, please contact night-coverage at [email or web address], Office  [phone]   10/17/2023, 7:49 AM  LOS: 2 days

## 2023-10-17 NOTE — Progress Notes (Signed)
 [facility] Liaison Note  10/17/2023  [name] [facility] [birth year] [id number]  Location: RN Hospital Liaison screened the patient remotely at [facility].  Insurance: [employer] [name] is a 89 y.o. female who is a Primary Care Patient of Panosh, Joaquim Muir, MD [facility]. The patient was screened for  readmission hospitalization with noted extreme risk score for unplanned readmission risk with 3 IP/1 ED in 6 months.  The patient was assessed for potential Care Management service needs for post hospital transition for care coordination. Review of patient's electronic medical record reveals patient was admitted for Sepsis. VBCI listed to follow for post hospital transition of care follow up calls.   Plan: [facility] Liaison will continue to follow progress and disposition to asess for post hospital community care coordination/management needs.  Referral request for community care coordination: anticipate Transitions of Care Team follow up.   VBCI Care Management/Population Health does not replace or interfere with any arrangements made by the Inpatient Transition of Care team.   For questions contact:   Lilla Reichert, RN, BSN Hospital Liaison [place]   [facility], Population Health Office Hours MTWF  8:00 am-6:00 pm Direct Dial: [phone] mobile [email or web address]

## 2023-10-17 NOTE — Consult Note (Signed)
 [facility] Telemedicine Consult   Requesting Provider: Daivd Dub Consult Participants: Myself, Herman Longs bedside nurse, Alexis Anger bedside nurse, Anisa atrium nurse Location of the provider: [facility] [place]  Location of the patient: [name] [address]  This consult was provided via telemedicine with 2-way video and audio communication. Patient could not consent due to AMS but emergent evaluation requested by team   Chief Complaint: AMS  HPI: This is a 89 y.o. woman with PMHx significant for HTN, HLD, CKD stage V, CHF, Anxiety/Depression, asthma  In brief she was recently admitted with heart failure exacerbation.  After discharge developed a painful vesicular rash on her right neck and face that was felt to be consistent with VZV by appearance and history.  Rashes documented as having started in approximately 5/30.  She was started on valacyclovir and became increasingly confused.  There was concern for valacyclovir related encephalopathy and she was readmitted.  She was started on vancomycin  and cefepime as well for concern for sepsis and has had a positive blood culture (1 out of 2 -- Streptococcus species, not Enterococcus, pyogenes, or pneumoniae, possibly contaminant).  Was narrowed to ceftriaxone  today  This morning she was felt to have a new left facial droop for which code stroke was activated, reportedly oriented only to self at baseline last seen at this level of orientation at 5:40 AM  Hospital course has been complicated by persistent hypertension with systolic blood pressures mostly in the 180s-200s  Regarding her ESRD, per chart notes she has refused ever being on dialysis, and palliative care consultation has been recommended due to this although she remains FULL CODE STATUS  LKW: ~5/31 Thrombolytic given?: No, out of the window Checklist of contradindications was reviewed.  Risks, benefits and alternatives were discussed IR Thrombectomy? No, exam not consistent with new LVO and out of  the window Modified Rankin scale, unable to determine due to no family present and inconsistent documentation (per PT note from 08/10/2023 was still driving and occasionally ambulating with a cane but required granddaughters help with medications and had 24/7 support from family at home) Time of teleneurologist evaluation: 8:38 AM  Exam: Vitals:   10/17/23 0600 10/17/23 0615  BP: (!) 174/54 (!) 169/59  Pulse: 73 72  Resp: 18 20  Temp: 98.6 F (37 C) 98.6 F (37 C)  SpO2: 97% 97%    General: Severe myoclonic jerking with any attempts at movement as well as with attempts at speech Pulmonary: breathing comfortably Cardiac: regular rate and rhythm on monitor   NIH Stroke scale 1A: Level of Consciousness - 1 1B: Ask Month and Age - 1 reports it is May 1C: 'Blink Eyes' & 'Squeeze Hands' - 0 2: Test Horizontal Extraocular Movements - 0 3: Test Visual Fields - 0 4: Test Facial Palsy - 0 5A: Test Left Arm Motor Drift - 2 appears a little weaker on the left than the right but can be briefly antigravity with both 5B: Test Right Arm Motor Drift - 2 6A: Test Left Leg Motor Drift - 3 wiggles toes but not antigravity with both legs 6B: Test Right Leg Motor Drift - 3 7: Test Limb Ataxia - X severely myoclonic throughout, unable to perform 8: Test Sensation - 0 grossly equally reactive to touch throughout 9: Test Language/Aphasia- 1 mild difficulty naming which appears to be more likely perseveration /poor attention/concentration 10: Test Dysarthria - 0 possibly mild dysarthria given unclear baseline speech but certainly not disabling 11: Test Extinction/Inattention - 0 NIHSS score: 14 Additionally patient  was oriented to hospital  Imaging Reviewed:  Head CT stable  Labs reviewed in epic and pertinent values follow:  Basic Metabolic Panel: Recent Labs  Lab 10/14/23 1720 10/14/23 2309 10/15/23 0232 10/16/23 0307 10/17/23 0313  NA 134*  --  131* 135 137  K 4.3  --  4.0 4.3 4.3  CL  100  --  100 107 106  CO2 20*  --  19* 18* 17*  GLUCOSE 130*  --  133* 100* 110*  BUN 80*  --  80* 77* 79*  CREATININE 5.97* 5.50* 5.64* 5.43* 5.20*  CALCIUM  8.4*  --  8.2* 8.2* 8.1*    CBC: Recent Labs  Lab 10/14/23 0233 10/14/23 1720 10/15/23 0232 10/16/23 0307 10/17/23 0313  WBC 1.8* 2.7* 1.9* 3.0* 3.6*  HGB 9.5* 9.9* 9.4* 10.1* 10.8*  HCT 31.3* 32.8* 31.1* 33.6* 35.5*  MCV 98.4 98.2 98.1 98.8 97.5  PLT 195 202 194 179 204    Assessment:   In brief this is an 89 year old woman admitted with concern for shingles and valacyclovir toxicity, with past medical history significant for CKD stage V not on dialysis, CHF, hypertension, hyperlipidemia, asthma  Stroke code activated for left-sided facial droop, but overall patient per bedside team is actually improved from both their initial evaluation as well as from recent baseline of being oriented only x 1, and I do not clearly appreciate a facial droop on my evaluation; her picture on the chart does show a subtle left facial droop which Dr. Alfonse Angle agrees is similar to the facial droop he appreciated and with this additional information, likely baseline.    Her exam is overall nonfocal, most notable for poor attention/concentration, perseveration, mild disorientation, and florid myoclonic movements.  Likely she has toxicity from valacyclovir but potentially also cefepime especially given her baseline renal function  I do think an MRI brain is reasonable to exclude an acute intracranial process.  If on further history she is actually been worsening over the last few days despite treatment would obtain a lumbar puncture as well to exclude VZV encephalitis  Especially due to cefepime exposure and potential for PRES would obtain EEG to assess for nonconvulsive status epilepticus  Impression: - Urgent need to rule out PRES - Likely multifactorial delirium in the setting of shingles rash/pain, hospitalization, medication exposure  including cefepime and vancomycin  - Myoclonus in the setting of medications including cefepime and vancomycin  with poor baseline renal function - Uremia which appears stable since late March, CKD stage V  Recommendations:  - Stat head CT to rule out acute intracranial process - Stat MRI brain to more sensitively rule out acute intracranial process - Okay to treat blood pressure as out of the permissive hypertension window, treat as hypertensive urgency/emergency at this time per standard gradual lowering protocols - May more aggressively treat blood pressure pending results of MRI brain for example if consistent with PRES will need systolic blood pressure 140-160 - Routine EEG - Discussed with Dr. Doretta Gant and Dr. Alfonse Angle via secure chat   This patient is receiving care for possible acute neurological changes. There was 50 minutes of care by this provider at the time of service, including time for direct evaluation via telemedicine, review of medical records, imaging studies and discussion of findings with providers, the patient and/or family.   Baldwin Levee MD-PhD [facility] [phone]   If 8pm-8am, please page neurology on call as listed in AMION.  CRITICAL CARE Performed by: Ronnette Coke   Total critical care  time: 50 minutes  Critical care time was exclusive of separately billable procedures and treating other patients.  Critical care was necessary to treat or prevent imminent or life-threatening deterioration, emergent evaluation for consideration of thrombolytic, thrombectomy, and concern for possible PRES.  Critical care was time spent personally by me on the following activities: development of treatment plan with patient and/or surrogate as well as nursing, discussions with consultants, evaluation of patient's response to treatment, examination of patient, obtaining history from patient or surrogate, ordering and performing treatments and interventions,  ordering and review of laboratory studies, ordering and review of radiographic studies, pulse oximetry and re-evaluation of patient's condition.

## 2023-10-17 NOTE — Consult Note (Addendum)
 Code stroke activated at 0834. Dr Alfonse Angle at bedside. Report received from nurse of LKW [phone] and recognition of symptoms 0805 for new L facial droop. Pt has been encephalopathic and confused for this admission.   Dr Cleone Dad with neuro paged at [phone] and was on camera at [phone]. NIH exam began at 0840.  Left for CT at 0841.  Dr Cleone Dad determined no TNK or CTA needed at 0856.   mRS 4.   TSRN off camera at 0858 per Dr Cleone Dad.   Ruven Coy, telestroke RN

## 2023-10-17 NOTE — Progress Notes (Signed)
 Patient has one PIV access, and patient's RN didn't know need a PIV access or not at this time. Informed patient's RN if need a PIV access, put in the consult. HS [employer]

## 2023-10-17 NOTE — Progress Notes (Signed)
 PT Cancellation Note  Patient Details Name: [name] MRN: [id number] DOB: 29-May-1933   Cancelled Treatment:    Reason Eval/Treat Not Completed: Medical issues which prohibited therapy. PT to continue to follow acutely.   Cary Clarks, PT Acute Rehab   Annalee Kiang 10/17/2023, 12:20 PM

## 2023-10-17 NOTE — Progress Notes (Signed)
Routine EEG complete. Results pending.

## 2023-10-17 NOTE — Progress Notes (Signed)
 MRI brain personally reviewed, agree with radiology report no acute intracranial process   EEG pending ***  Baldwin Levee MD-PhD [facility] [phone] Available 7 AM to 7 PM, outside these hours please contact Neurologist on call listed on AMION

## 2023-10-17 NOTE — Procedures (Signed)
 Patient Name: [name]  MRN: [id number]  Epilepsy Attending: Arleene Lack  Referring Physician/Provider: Ozell Blunt, MD  Date: 10/17/2023 Duration: 22.41 mins  Patient history: 89yo F with ams. EEG to evaluate for seizure  Level of alertness: comatose/lethargic  AEDs during EEG study: None  Technical aspects: This EEG study was done with scalp electrodes positioned according to the 10-20 International system of electrode placement. Electrical activity was reviewed with band pass filter of 1-70Hz , sensitivity of 7 uV/mm, display speed of 70mm/sec with a 60Hz  notched filter applied as appropriate. EEG data were recorded continuously and digitally stored.  Video monitoring was available and reviewed as appropriate.  Description: EEG showed continuous generalized 3 to 6 Hz theta-delta slowing. Intermittently, generalized periodic discharges with triphasic morphology at 1 Hz were also noted, more prominent when awake/stimulated. Hyperventilation and photic stimulation were not performed.     ABNORMALITY - Periodic discharges with triphasic morphology, generalized ( GPDs) - Continuous slow, generalized  IMPRESSION: This study is suggestive of moderate to severe diffuse encephalopathy likely related to toxic-metabolic etiology. No seizures or definite epileptiform discharges were seen throughout the recording.  Davonte Siebenaler O Michaele Amundson

## 2023-10-18 DIAGNOSIS — A419 Sepsis, unspecified organism: Secondary | ICD-10-CM | POA: Diagnosis not present

## 2023-10-18 DIAGNOSIS — B028 Zoster with other complications: Secondary | ICD-10-CM | POA: Diagnosis not present

## 2023-10-18 DIAGNOSIS — E782 Mixed hyperlipidemia: Secondary | ICD-10-CM

## 2023-10-18 DIAGNOSIS — T375X5A Adverse effect of antiviral drugs, initial encounter: Secondary | ICD-10-CM | POA: Diagnosis not present

## 2023-10-18 DIAGNOSIS — N184 Chronic kidney disease, stage 4 (severe): Secondary | ICD-10-CM

## 2023-10-18 DIAGNOSIS — B020 Zoster encephalitis: Secondary | ICD-10-CM | POA: Diagnosis not present

## 2023-10-18 DIAGNOSIS — R531 Weakness: Secondary | ICD-10-CM | POA: Diagnosis not present

## 2023-10-18 DIAGNOSIS — R6520 Severe sepsis without septic shock: Secondary | ICD-10-CM | POA: Diagnosis not present

## 2023-10-18 LAB — CULTURE, BLOOD (ROUTINE X 2): Special Requests: ADEQUATE

## 2023-10-18 MED ORDER — DEXTROSE 5 % IV SOLN
300.0000 mg | INTRAVENOUS | Status: DC
  Administered 2023-10-18 – 2023-10-21 (×4): 300 mg via INTRAVENOUS
  Filled 2023-10-18 (×5): qty 6

## 2023-10-18 MED ORDER — CLONIDINE HCL 0.1 MG PO TABS
0.1000 mg | ORAL_TABLET | Freq: Once | ORAL | Status: AC
  Administered 2023-10-18: 0.1 mg via ORAL
  Filled 2023-10-18: qty 1

## 2023-10-18 MED ORDER — LACTATED RINGERS IV SOLN: INTRAVENOUS | Status: DC

## 2023-10-18 NOTE — Consult Note (Signed)
 [facility]    Date of Admission:  10/14/2023   Total days of inpatient antibiotics 4        Reason for Consult: Zoster     Principal Problem:   Sepsis (HCC) Active Problems:   HTN (hypertension)   Hyperlipidemia   Anemia, chronic disease   Anxiety state   Anemia of chronic disease   Asthma, chronic   Acute kidney injury superimposed on stage 5 chronic kidney disease, not on chronic dialysis (HCC)   Acute encephalopathy   Hypothermia   Assessment:  89 YF with CKD V, HTN, astham, CHF admitted with acute encepholopathy.  #SHIngles with concern for zoster encephalitis -  Recommendations:   Microbiology:   Antibiotics: Cefepime 6/2-3 Ctx 6/4- Doxy 6/1-4 Vanc 6/2  Cultures: Blood 1/4 bottles strep parasanguinis Urine  Other   HPI: [name] is a 89 y.o. female with past medical history of CKD stage V, hypertension, asthma, CHF, recent admission for CHF exacerbation 2 weeks ago, brought in as family noticed patient became increasing confused.  About 2 days prior to admission she started having vesicles on her neck and upper chest.  She was taken to see UC and Rx Valtrex 1 g 3 times daily.  She taken about 5 doses of valtrex when she developed confusion and hallucinations. Admitted for sepsis, ams. Code stroke activated due to left sided facial droop. Neuro engaged. Ct head and MRI showed no acute abnormalities. EEG showed encephalopathy. Related to toxic/metabolic. Neuro felt 2/2 cefepime , valacyclovir toxicity. ID engaged.      Review of Systems: Review of Systems  All other systems reviewed and are negative.   Past Medical History:  Diagnosis Date   Acute superficial venous thrombosis of left lower extremity 03/27/2014   concern about hx and potential extensive on exam tenderness calf and low dose lovenox  40 qd 2-4 weekselevetion and close  fu advised .    Allergy    Anemia    Anxiety    Arthritis    "shoulders"  (09/09/2012)   Asthma 09/08/2012   Carotid bruit    us  2018  low risk 1 - 39%   Cataract    bil cateracts removed   CKD (chronic kidney disease) stage 5, GFR less than 15 ml/min (HCC)    as of may 2025, creat 4.5- 5.5 range   Clotting disorder (HCC)    blood clots in legs   Diabetes mellitus without complication (HCC)    "Borderline" per pt; being monitored.  no meds per pt   Diverticulosis    Dyspnea 04/27/2014   GERD (gastroesophageal reflux disease)    Headache(784.0)    "related to my high blood pressure" (09/09/2012)   Hiatal hernia    History of DVT (deep vein thrombosis)    "RLE" (09/09/2012) coumadine cant take asa so on plavix     HTN (hypertension) 09/08/2012   Hyperlipidemia    Lupus    "cured years ago" (09/09/2012)   Other dysphagia 02/11/2013   Syncope and collapse 01/21/2018   Varicose veins    Varicose veins of leg with complications 12/05/2010    Social History   Tobacco Use   Smoking status: Never   Smokeless tobacco: Never  Vaping Use   Vaping status: Never Used  Substance Use Topics   Alcohol use: No   Drug use: No    Family History  Problem Relation Age of Onset   Hypertension Mother  Lung cancer Mother    Hypertension Father    Lung cancer Father    Breast cancer Sister    Colon cancer Sister        ? 33' s dx - died in 52's   Breast cancer Sister    Hypertension Brother    Rectal cancer Brother    Stomach cancer Brother    Breast cancer Brother    Heart attack Daughter    Esophageal cancer Daughter    Hypertension Daughter    Diabetes Daughter    Breast cancer Paternal Aunt    Lung cancer Other        both parents    Pancreatic cancer Neg Hx    Scheduled Meds:  amLODipine   10 mg Oral QHS   carvedilol   12.5 mg Oral BID   Chlorhexidine  Gluconate Cloth  6 each Topical Daily   citalopram   10 mg Oral QHS   cloNIDine   0.1 mg Transdermal Weekly   cyanocobalamin   1,000 mcg Oral Daily   feeding supplement (NEPRO CARB STEADY)  237 mL  Oral BID BM   ferrous sulfate   325 mg Oral Q breakfast   heparin   5,000 Units Subcutaneous Q8H   lidocaine   1 patch Transdermal Q24H   montelukast   10 mg Oral QHS   sodium bicarbonate   650 mg Oral BID   sodium chloride  flush  3-10 mL Intravenous Q12H   torsemide   40 mg Oral BID   Continuous Infusions:  acyclovir 106 mL/hr at 10/18/23 1522   cefTRIAXone  (ROCEPHIN )  IV Stopped (10/18/23 0840)   lactated ringers  50 mL/hr at 10/18/23 1522   PRN Meds:.acetaminophen  **OR** acetaminophen , albuterol , artificial tears, budesonide -glycopyrrolate -formoterol , hydrALAZINE , mouth rinse, sodium chloride  flush Allergies  Allergen Reactions   Penicillins Nausea And Vomiting   Asa [Aspirin] Other (See Comments)    Wheezing Acetaminophen  is OK    Fish Allergy Itching, Nausea And Vomiting and Swelling    Swelling of the face    OBJECTIVE: Blood pressure (!) 149/58, pulse (!) 58, temperature (!) 97.2 F (36.2 C), resp. rate 12, height 5\' 2"  (1.575 m), weight 61.6 kg, SpO2 100%.  Physical Exam Constitutional:      Appearance: Normal appearance.  HENT:     Head: Normocephalic and atraumatic.     Right Ear: Tympanic membrane normal.     Left Ear: Tympanic membrane normal.     Nose: Nose normal.     Mouth/Throat:     Mouth: Mucous membranes are moist.  Eyes:     Extraocular Movements: Extraocular movements intact.     Conjunctiva/sclera: Conjunctivae normal.     Pupils: Pupils are equal, round, and reactive to light.  Cardiovascular:     Rate and Rhythm: Normal rate and regular rhythm.     Heart sounds: No murmur heard.    No friction rub. No gallop.  Pulmonary:     Effort: Pulmonary effort is normal.     Breath sounds: Normal breath sounds.  Abdominal:     General: Abdomen is flat.     Palpations: Abdomen is soft.  Musculoskeletal:        General: Normal range of motion.  Skin:    General: Skin is warm and dry.  Neurological:     General: No focal deficit present.     Mental  Status: She is alert and oriented to person, place, and time.  Psychiatric:        Mood and Affect: Mood normal.     Lab Results  Lab Results  Component Value Date   WBC 3.6 (L) 10/17/2023   HGB 10.8 (L) 10/17/2023   HCT 35.5 (L) 10/17/2023   MCV 97.5 10/17/2023   PLT 204 10/17/2023    Lab Results  Component Value Date   CREATININE 5.20 (H) 10/17/2023   BUN 79 (H) 10/17/2023   NA 137 10/17/2023   K 4.3 10/17/2023   CL 106 10/17/2023   CO2 17 (L) 10/17/2023    Lab Results  Component Value Date   ALT 8 10/15/2023   AST 16 10/15/2023   ALKPHOS 37 (L) 10/15/2023   BILITOT 0.8 10/15/2023       Orlie Bjornstad, MD [facility] 10/18/2023, 3:23 PM

## 2023-10-18 NOTE — Evaluation (Signed)
 Physical Therapy Evaluation Patient Details Name: [name] MRN: [id number] DOB: Nov 07, 1933 Today's Date: 10/18/2023  History of Present Illness  89 y.o. female was brought to the ER 10/14/23 after patient's family noticed patient was getting increasingly confused. About 2 days PTA patient started developing right neck and right upper chest vesicular lesions which was getting painful and had gone to her primary care physician was prescribed Valtrex for herpes zoster. Patient has so far taken 5 doses of Valtrex 1 g. Patient became confused was hallucinating and was talking about things which were not there. Pt with history of chronic kidney disease stage V, hypertension, asthma, anemia, CHF. Pt recently admitted 5/9-5/14/25 for respiratory failure secondary to acute CHF.  Clinical Impression  Pt admitted with above diagnosis.  Pt currently with functional limitations due to the deficits listed below (see PT Problem List). Pt will benefit from acute skilled PT to increase their independence and safety with mobility to allow discharge.  Pt agreeable to mobilize and assisted with sitting EOB.  Pt requiring increased assist physically and with providing multimodal cues.  Pt sat EOB and performed LAQs x5 each LE and then initiated a reaching activity however very fatigued and trunk returning to bed.  So, pt safely assisted back to supine and repositioned. BP 190/54 mmHg upon returning to bed and with recheck: 184/82 mmHg so RN notified.  Pt is currently significant limited in mobility and strength and would benefit from continued inpatient follow up therapy, <3 hours/day.          If plan is discharge home, recommend the following: Two people to help with walking and/or transfers;Two people to help with bathing/dressing/bathroom;Assistance with cooking/housework;Assist for transportation;Help with stairs or ramp for entrance   Can travel by private vehicle   No    Equipment Recommendations  Wheelchair (measurements PT);Wheelchair cushion (measurements PT);BSC/3in1;Rolling walker (2 wheels) (recommended at last admission)  Recommendations for Other Services       Functional Status Assessment Patient has had a recent decline in their functional status and demonstrates the ability to make significant improvements in function in a reasonable and predictable amount of time.     Precautions / Restrictions Precautions Precautions: Fall Recall of Precautions/Restrictions: Impaired      Mobility  Bed Mobility Overal bed mobility: Needs Assistance Bed Mobility: Supine to Sit, Sit to Supine     Supine to sit: Total assist, +2 for physical assistance Sit to supine: Total assist, +2 for physical assistance   General bed mobility comments: multimodal cues and time provided, pt with difficulty both cognitively and physically performing movement requiring total assist, utilized bed pads to scoot and reposition; repositioned with multiple pillows upon return to supine    Transfers                        Ambulation/Gait                  Stairs            Wheelchair Mobility     Tilt Bed    Modified Rankin (Stroke Patients Only)       Balance Overall balance assessment: Needs assistance Sitting-balance support: Bilateral upper extremity supported, Feet supported Sitting balance-Leahy Scale: Poor   Postural control: Posterior lean  Pertinent Vitals/Pain Pain Assessment Pain Assessment: No/denies pain Pain Intervention(s): Repositioned, Monitored during session    Home Living Family/patient expects to be discharged to:: Private residence Living Arrangements: Other relatives (granddaughter) Available Help at Discharge: Family;Available PRN/intermittently Type of Home: Apartment Home Access: Level entry       Home Layout: One level Home Equipment: Cane - single point;Shower seat       Prior Function Prior Level of Function : Independent/Modified Independent;Driving             Mobility Comments: ModI  for mobility using the SPC. Denies falls in the last 4mo. ADLs Comments: Ind with ADLs and light meal prep. Pt can drive, but doesn't as much anymore and will not drive at night. Typically relies on her family for transportation. Granddaughter manages medications     Extremity/Trunk Assessment   Upper Extremity Assessment Upper Extremity Assessment: Generalized weakness (edematous UEs, limited active shoulder movement observed, required assist for positioning)    Lower Extremity Assessment Lower Extremity Assessment: Generalized weakness (edematous LEs)       Communication   Communication Communication: Impaired Factors Affecting Communication: Reduced clarity of speech;Difficulty expressing self    Cognition Arousal: Alert Behavior During Therapy: Flat affect   PT - Cognitive impairments: Problem solving, Sequencing, Initiation                       PT - Cognition Comments: flat, delayed processing; following simple commands, decreased initation         Cueing Cueing Techniques: Verbal cues, Tactile cues, Gestural cues     General Comments      Exercises     Assessment/Plan    PT Assessment Patient needs continued PT services  PT Problem List Decreased strength;Decreased activity tolerance;Decreased balance;Decreased mobility;Decreased coordination;Decreased knowledge of use of DME;Decreased safety awareness       PT Treatment Interventions DME instruction;Gait training;Functional mobility training;Therapeutic activities;Therapeutic exercise;Balance training;Patient/family education    PT Goals (Current goals can be found in the Care Plan section)  Acute Rehab PT Goals PT Goal Formulation: With patient Time For Goal Achievement: 11/01/23 Potential to Achieve Goals: Fair    Frequency Min 2X/week     Co-evaluation                AM-PAC PT "6 Clicks" Mobility  Outcome Measure Help needed turning from your back to your side while in a flat bed without using bedrails?: Total Help needed moving from lying on your back to sitting on the side of a flat bed without using bedrails?: Total Help needed moving to and from a bed to a chair (including a wheelchair)?: Total Help needed standing up from a chair using your arms (e.g., wheelchair or bedside chair)?: Total Help needed to walk in hospital room?: Total Help needed climbing 3-5 steps with a railing? : Total 6 Click Score: 6    End of Session Equipment Utilized During Treatment: Gait belt Activity Tolerance: Patient limited by fatigue Patient left: with call bell/phone within reach;in bed;with bed alarm set;with family/visitor present   PT Visit Diagnosis: Muscle weakness (generalized) (M62.81);Other abnormalities of gait and mobility (R26.89)    Time: 1610-9604 PT Time Calculation (min) (ACUTE ONLY): 20 min   Charges:   PT Evaluation $PT Eval Low Complexity: 1 Low   PT General Charges $$ ACUTE PT VISIT: 1 Visit        Henretta Lodge PT, DPT Physical Therapist Acute Rehabilitation Services Office: [phone]   Kati L Payson  10/18/2023, 12:52 PM

## 2023-10-18 NOTE — Progress Notes (Signed)
 Speech Language Pathology Treatment: Dysphagia  Patient Details Name: [name] MRN: [id number] DOB: 11/15/1933 Today's Date: 10/18/2023 Time: 4098-1191 SLP Time Calculation (min) (ACUTE ONLY): 23 min  Assessment / Plan / Recommendation Clinical Impression   Per RN, today pt is "vibrant", upon entrance to room she was sleeping but did awaken easily.   She reports she is "thirsty".  Arms remain edematous and thus she has difficulty self feeding - however was able to bring cup to mouth to drink from straw.  Continues with minimally clinical indication of delayed subswallows with liquids and oral retention with solids that clear with liquid wash.  Using teach back, pt did not verbalize precautions.  No s/s of aspiration and mentation improved today.   Pt remains weak with cued cough and voice - but she mentation is much improved.  In discussion with pt, she reports she eats softer foods at home.  Recommend dys3/thin diet with strict precautions.  Anticipate ongoing improvement, no family present during session.  Will follow up once more to assure family/pt educated and pt managing po well.   HPI HPI: Per MD note "89 y.o. female with history of chronic kidney disease stage V, hypertension, asthma, anemia, CHF recently admitted for respiratory failure secondary to acute CHF discharge about 2 weeks ago was brought to the ER after patient's family noticed patient was getting increasingly confused.  About 2 days ago patient started developing right neck and right upper chest vesicular lesions and yesterday had gone to urgent care and was prescribed Valtrex for herpes zoster.  Patient has so far taken 5 doses of Valtrex 1 g.  Patient became confused was hallucinating and was talking about things which were not there."  Concern present for potential ongoing confusion due to toxicity from Valtrex - pt has renal disease.  After admit to hospital, she was noted to have facial asymmetry and CT brain, MRI conducted  that were both negative.  She is on airborne precautions for shingles -  DG abdomen concerning for right lung infiltrate - although improved per notes. Pt did not pass [facility] due to coughing - thus SLP swallow eval ordered.  Pt present and both she and family deny her having baseline dysphagia.      SLP Plan  Continue with current plan of care          Recommendations  Diet recommendations: Dysphagia 3 (mechanical soft);Thin liquid Liquids provided via: Cup;Straw Medication Administration: Whole meds with puree Supervision: Staff to assist with self feeding Compensations: Slow rate;Small sips/bites;Other (Comment) (allow time for delayed subswallows most notably with liquids) Postural Changes and/or Swallow Maneuvers: Seated upright 90 degrees;Out of bed for meals                  Oral care BID   None Dysphagia, unspecified (R13.10)     Continue with current plan of care    Angela Sorrow, MS [facility] SLP Acute Rehab Services Office [phone]  Chantal Comment  10/18/2023, 12:24 PM

## 2023-10-18 NOTE — Progress Notes (Signed)
 [facility]  PROGRESS NOTE  [name] WGN:[id number] DOB: 05-17-1933 DOA: 10/14/2023 PCP: Reginal Capra, MD   Brief HPI:   89 y.o. female with history of chronic kidney disease stage V, hypertension, asthma, anemia, CHF recently admitted for respiratory failure secondary to acute CHF discharge about 2 weeks ago was brought to the ER after patient's family noticed patient was getting increasingly confused.  About 2 days ago patient started developing right neck and right upper chest vesicular lesions and yesterday had gone to urgent care and was prescribed Valtrex for herpes zoster.  Patient has so far taken 5 doses of Valtrex 1 g.  Patient became confused was hallucinating and was talking about things which were not there.     Assessment/Plan:   Left-sided facial droop - Code stroke was called, CT head negative for stroke. - Neurology consulted, recommended MRI brain stat - MRI brain was negative  Sepsis - - X-ray showed infiltrates.  - broad-spectrum antibiotics cultures ordered.   - follow blood cultures -Continue ceftriaxone    Acute encephalopathy/delirium - Delirium precautions - Suspect due to Valtrex toxicity - Significantly improved   +Blood cultures - 1/4 bottles growing Streptococcus para sanguinous  - Likely contamination -Will consult ID   ?Herpes zoster  -was given Valtrex at UC and dose not adjusted for renal failure - Hold due to toxicity-based on dosing and kidney function suspect she could remain confused for up to a week -Will consult ID   Chronic kidney disease stage V  -with metabolic acidosis  - bicarbonate   Hypertension  - Blood pressure is now elevated -Restart home medications including Coreg , amlodipine  -Start IV hydralazine  10 mg every 4 hours as needed -Catapres  0.1 mg patch q. 7 days -Will give 1 dose of Catapres  0.1 mg   History of CHF  - Torsemide  has been restarted   Anemia  -due to renal disease - follow CBC    History of asthma  -presently not wheezing.   -Continue home inhalers.   History of anxiety and depression  -on Celexa  and as needed Ativan .    Medications     amLODipine   10 mg Oral QHS   carvedilol   12.5 mg Oral BID   Chlorhexidine  Gluconate Cloth  6 each Topical Daily   citalopram   10 mg Oral QHS   cloNIDine   0.1 mg Transdermal Weekly   cyanocobalamin   1,000 mcg Oral Daily   feeding supplement (NEPRO CARB STEADY)  237 mL Oral BID BM   ferrous sulfate   325 mg Oral Q breakfast   heparin   5,000 Units Subcutaneous Q8H   lidocaine   1 patch Transdermal Q24H   montelukast   10 mg Oral QHS   sodium bicarbonate   650 mg Oral BID   sodium chloride  flush  3-10 mL Intravenous Q12H   torsemide   40 mg Oral BID     Data Reviewed:   CBG:  Recent Labs  Lab 10/17/23 0838  GLUCAP 100*    SpO2: 93 %    Vitals:   10/18/23 0430 10/18/23 0500 10/18/23 0530 10/18/23 0600  BP: (!) 179/57 (!) 185/57 (!) 195/57 (!) 191/54  Pulse: 72 76 76 73  Resp: 17 19 17 16   Temp: 98.2 F (36.8 C) 98.4 F (36.9 C) 98.4 F (36.9 C) 98.6 F (37 C)  TempSrc:      SpO2: 93% 96% 94% 93%  Weight:  61.6 kg    Height:          Data Reviewed:  Basic Metabolic Panel: Recent Labs  Lab 10/14/23 1720 10/14/23 2309 10/15/23 0232 10/16/23 0307 10/17/23 0313  NA 134*  --  131* 135 137  K 4.3  --  4.0 4.3 4.3  CL 100  --  100 107 106  CO2 20*  --  19* 18* 17*  GLUCOSE 130*  --  133* 100* 110*  BUN 80*  --  80* 77* 79*  CREATININE 5.97* 5.50* 5.64* 5.43* 5.20*  CALCIUM  8.4*  --  8.2* 8.2* 8.1*    CBC: Recent Labs  Lab 10/14/23 0233 10/14/23 1720 10/15/23 0232 10/16/23 0307 10/17/23 0313  WBC 1.8* 2.7* 1.9* 3.0* 3.6*  HGB 9.5* 9.9* 9.4* 10.1* 10.8*  HCT 31.3* 32.8* 31.1* 33.6* 35.5*  MCV 98.4 98.2 98.1 98.8 97.5  PLT 195 202 194 179 204    LFT Recent Labs  Lab 10/14/23 1720 10/15/23 0232  AST 21 16  ALT 8 8  ALKPHOS 40 37*  BILITOT 0.7 0.8  PROT 6.2* 5.7*  ALBUMIN  2.9*  2.7*     Antibiotics: Anti-infectives (From admission, onward)    Start     Dose/Rate Route Frequency Ordered Stop   10/17/23 0800  cefTRIAXone  (ROCEPHIN ) 2 g in sodium chloride  0.9 % 100 mL IVPB        2 g 200 mL/hr over 30 Minutes Intravenous Every 24 hours 10/16/23 0915     10/15/23 0800  ceFEPIme (MAXIPIME) 1 g in sodium chloride  0.9 % 100 mL IVPB  Status:  Discontinued        1 g 200 mL/hr over 30 Minutes Intravenous Every 24 hours 10/15/23 0655 10/16/23 0915   10/15/23 0606  vancomycin  variable dose per unstable renal function (pharmacist dosing)  Status:  Discontinued         Does not apply See admin instructions 10/15/23 0606 10/16/23 0915   10/15/23 0600  vancomycin  (VANCOREADY) IVPB 1250 mg/250 mL        1,250 mg 166.7 mL/hr over 90 Minutes Intravenous  Once 10/15/23 0557 10/15/23 1019   10/15/23 0400  cefTRIAXone  (ROCEPHIN ) 2 g in sodium chloride  0.9 % 100 mL IVPB  Status:  Discontinued        2 g 200 mL/hr over 30 Minutes Intravenous Every 24 hours 10/15/23 0347 10/15/23 0638   10/15/23 0400  doxycycline  (VIBRAMYCIN ) 100 mg in sodium chloride  0.9 % 250 mL IVPB        100 mg 125 mL/hr over 120 Minutes Intravenous Every 12 hours 10/15/23 0348          DVT prophylaxis: Heparin   Code Status: Full code  Family Communication: No family at bedside   CONSULTS    Subjective    Mental status significantly improved  Objective    Physical Examination:   Appears in no acute distress S1-S2, regular Lungs clear to auscultation bilaterally Abdomen is soft, nontender, no organomegaly Alert, answering questions appropriately   Status is: Inpatient:             [name]   [facility] If 7PM-7AM, please contact night-coverage at [email or web address], Office  [phone]   10/18/2023, 7:50 AM  LOS: 3 days

## 2023-10-18 NOTE — Progress Notes (Signed)
 Pharmacy Antibiotic Note  [name] is a 89 y.o. 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  Pharmacy has been consulted for acyclovir dosing. Noted patient in AKI on CKD V - Scr improved to 5.20.   Plan: Acyclovir 300 mg (~ 5 mg/kg) every 24 hours  Will start maintenance fluids LR @ 50 ml/hr reduced rate for renal protection Monitor renal function, mental status closely    Height: 5\' 2"  (157.5 cm) Weight: 61.6 kg (135 lb 12.9 oz) IBW/kg (Calculated) : 50.1  Temp (24hrs), Avg:98.1 F (36.7 C), Min:96.4 F (35.8 C), Max:99 F (37.2 C)  Recent Labs  Lab 10/14/23 0233 10/14/23 1720 10/14/23 2309 10/15/23 0232 10/15/23 1546 10/16/23 0307 10/17/23 0313  WBC 1.8* 2.7*  --  1.9*  --  3.0* 3.6*  CREATININE  --  5.97* 5.50* 5.64*  --  5.43* 5.20*  LATICACIDVEN  --   --   --   --  0.6  --   --     Estimated Creatinine Clearance: 6.6 mL/min (A) (by C-G formula based on SCr of 5.2 mg/dL (H)).    Allergies  Allergen Reactions   Penicillins Nausea And Vomiting   Asa [Aspirin] Other (See Comments)    Wheezing Acetaminophen  is OK    Fish Allergy Itching, Nausea And Vomiting and Swelling    Swelling of the face      Thank you for allowing pharmacy to be a part of this patient's care.  Denson Flake, PharmD, BCPS, BCIDP Infectious Diseases Clinical Pharmacist Phone: [phone] 10/18/2023 2:09 PM

## 2023-10-19 ENCOUNTER — Encounter (HOSPITAL_COMMUNITY): Payer: Self-pay | Admitting: Internal Medicine

## 2023-10-19 ENCOUNTER — Inpatient Hospital Stay (HOSPITAL_COMMUNITY)

## 2023-10-19 DIAGNOSIS — A419 Sepsis, unspecified organism: Secondary | ICD-10-CM | POA: Diagnosis not present

## 2023-10-19 DIAGNOSIS — R531 Weakness: Secondary | ICD-10-CM | POA: Diagnosis not present

## 2023-10-19 DIAGNOSIS — R6520 Severe sepsis without septic shock: Secondary | ICD-10-CM | POA: Diagnosis not present

## 2023-10-19 DIAGNOSIS — N184 Chronic kidney disease, stage 4 (severe): Secondary | ICD-10-CM | POA: Diagnosis not present

## 2023-10-19 DIAGNOSIS — I38 Endocarditis, valve unspecified: Secondary | ICD-10-CM

## 2023-10-19 DIAGNOSIS — B020 Zoster encephalitis: Secondary | ICD-10-CM | POA: Diagnosis not present

## 2023-10-19 DIAGNOSIS — B028 Zoster with other complications: Secondary | ICD-10-CM | POA: Diagnosis not present

## 2023-10-19 DIAGNOSIS — T375X5A Adverse effect of antiviral drugs, initial encounter: Secondary | ICD-10-CM | POA: Diagnosis not present

## 2023-10-19 LAB — HSV 1/2 PCR (SURFACE)
HSV-1 DNA: NOT DETECTED
HSV-2 DNA: NOT DETECTED

## 2023-10-19 LAB — ECHOCARDIOGRAM COMPLETE: S' Lateral: 3.7 cm

## 2023-10-19 MED ORDER — CLONIDINE HCL 0.1 MG PO TABS
0.1000 mg | ORAL_TABLET | Freq: Three times a day (TID) | ORAL | Status: DC | PRN
  Administered 2023-10-19 – 2023-10-20 (×2): 0.1 mg via ORAL
  Filled 2023-10-19 (×3): qty 1

## 2023-10-19 NOTE — Progress Notes (Signed)
 [facility]  PROGRESS NOTE  [name] ZOX:[id number] DOB: 10-23-1933 DOA: 10/14/2023 PCP: Reginal Capra, MD   Brief HPI:   89 y.o. female with history of chronic kidney disease stage V, hypertension, asthma, anemia, CHF recently admitted for respiratory failure secondary to acute CHF discharge about 2 weeks ago was brought to the ER after patient's family noticed patient was getting increasingly confused.  About 2 days ago patient started developing right neck and right upper chest vesicular lesions and yesterday had gone to urgent care and was prescribed Valtrex for herpes zoster.  Patient has so far taken 5 doses of Valtrex 1 g.  Patient became confused was hallucinating and was talking about things which were not there.     Assessment/Plan:   Left-sided facial droop -Resolved - Code stroke was called, CT head negative for stroke. - Neurology consulted, recommended MRI brain stat - MRI brain was negative  Sepsis /bacteremia - X-ray showed infiltrates.  - 1/4 bottles growing Streptococcus para sanguinous - broad-spectrum antibiotics cultures ordered.   -Continue ceftriaxone  -ID consulted, TTE ordered   Acute encephalopathy/delirium -Improved - Delirium precautions - Suspect due to Valtrex toxicity - Significantly improved     ?Herpes zoster  -was given Valtrex at UC and dose not adjusted for renal failure - Hold due to toxicity-based on dosing and kidney function suspect she could remain confused for up to a week - ID consulted; started on acyclovir -Follow results of VZV and HSV swab   Chronic kidney disease stage V  -with metabolic acidosis  - bicarbonate   Hypertension  -Blood pressure has been elevated -Continue Coreg , amlodipine  -Start IV hydralazine  10 mg every 4 hours as needed -Catapres  0.1 mg patch q. 7 days   History of CHF  - Torsemide  has been restarted   Anemia  -due to renal disease - follow CBC   History of asthma  -presently not  wheezing.   -Continue home inhalers.   History of anxiety and depression  -on Celexa  and as needed Ativan .    Medications     amLODipine   10 mg Oral QHS   carvedilol   12.5 mg Oral BID   Chlorhexidine  Gluconate Cloth  6 each Topical Daily   citalopram   10 mg Oral QHS   cloNIDine   0.1 mg Transdermal Weekly   cyanocobalamin   1,000 mcg Oral Daily   feeding supplement (NEPRO CARB STEADY)  237 mL Oral BID BM   ferrous sulfate   325 mg Oral Q breakfast   heparin   5,000 Units Subcutaneous Q8H   lidocaine   1 patch Transdermal Q24H   montelukast   10 mg Oral QHS   sodium bicarbonate   650 mg Oral BID   sodium chloride  flush  3-10 mL Intravenous Q12H   torsemide   40 mg Oral BID     Data Reviewed:   CBG:  Recent Labs  Lab 10/17/23 0838  GLUCAP 100*    SpO2: 97 %    Vitals:   10/19/23 0400 10/19/23 0500 10/19/23 0600 10/19/23 0700  BP: 138/64 (!) 165/51    Pulse: (!) 103 70 66 66  Resp: 17 17 16 16   Temp: 98.1 F (36.7 C)  (!) 97.5 F (36.4 C) 97.8 F (36.6 C)  TempSrc: Rectal     SpO2: 96% 99% 96% 97%  Weight:      Height:          Data Reviewed:  Basic Metabolic Panel: Recent Labs  Lab 10/14/23 1720 10/14/23 2309 10/15/23 0232 10/16/23  5621 10/17/23 0313  NA 134*  --  131* 135 137  K 4.3  --  4.0 4.3 4.3  CL 100  --  100 107 106  CO2 20*  --  19* 18* 17*  GLUCOSE 130*  --  133* 100* 110*  BUN 80*  --  80* 77* 79*  CREATININE 5.97* 5.50* 5.64* 5.43* 5.20*  CALCIUM  8.4*  --  8.2* 8.2* 8.1*    CBC: Recent Labs  Lab 10/14/23 0233 10/14/23 1720 10/15/23 0232 10/16/23 0307 10/17/23 0313  WBC 1.8* 2.7* 1.9* 3.0* 3.6*  HGB 9.5* 9.9* 9.4* 10.1* 10.8*  HCT 31.3* 32.8* 31.1* 33.6* 35.5*  MCV 98.4 98.2 98.1 98.8 97.5  PLT 195 202 194 179 204    LFT Recent Labs  Lab 10/14/23 1720 10/15/23 0232  AST 21 16  ALT 8 8  ALKPHOS 40 37*  BILITOT 0.7 0.8  PROT 6.2* 5.7*  ALBUMIN  2.9* 2.7*     Antibiotics: Anti-infectives (From admission, onward)     Start     Dose/Rate Route Frequency Ordered Stop   10/18/23 1500  acyclovir (ZOVIRAX) 300 mg in dextrose  5 % 100 mL IVPB        300 mg 106 mL/hr over 60 Minutes Intravenous Every 24 hours 10/18/23 1412     10/17/23 0800  cefTRIAXone  (ROCEPHIN ) 2 g in sodium chloride  0.9 % 100 mL IVPB        2 g 200 mL/hr over 30 Minutes Intravenous Every 24 hours 10/16/23 0915     10/15/23 0800  ceFEPIme (MAXIPIME) 1 g in sodium chloride  0.9 % 100 mL IVPB  Status:  Discontinued        1 g 200 mL/hr over 30 Minutes Intravenous Every 24 hours 10/15/23 0655 10/16/23 0915   10/15/23 0606  vancomycin  variable dose per unstable renal function (pharmacist dosing)  Status:  Discontinued         Does not apply See admin instructions 10/15/23 0606 10/16/23 0915   10/15/23 0600  vancomycin  (VANCOREADY) IVPB 1250 mg/250 mL        1,250 mg 166.7 mL/hr over 90 Minutes Intravenous  Once 10/15/23 0557 10/15/23 1019   10/15/23 0400  cefTRIAXone  (ROCEPHIN ) 2 g in sodium chloride  0.9 % 100 mL IVPB  Status:  Discontinued        2 g 200 mL/hr over 30 Minutes Intravenous Every 24 hours 10/15/23 0347 10/15/23 0638   10/15/23 0400  doxycycline  (VIBRAMYCIN ) 100 mg in sodium chloride  0.9 % 250 mL IVPB  Status:  Discontinued        100 mg 125 mL/hr over 120 Minutes Intravenous Every 12 hours 10/15/23 0348 10/18/23 1345        DVT prophylaxis: Heparin   Code Status: Full code  Family Communication: No family at bedside   CONSULTS    Subjective   Denies any complaints   Objective    Physical Examination:   Appears in no acute distress S1-S2, regular Lungs clear to auscultation bilaterally Abdomen is soft, nontender Skin-large papules noted on the neck and right upper chest wall   Status is: Inpatient:             [name]   [facility] If 7PM-7AM, please contact night-coverage at [email or web address], Office  [phone]   10/19/2023, 7:33 AM  LOS: 4 days

## 2023-10-19 NOTE — Progress Notes (Addendum)
 [facility]  Date of Admission:  10/14/2023   Total days of inpatient antibiotics 4  Principal Problem:   Sepsis (HCC) Active Problems:   HTN (hypertension)   Hyperlipidemia   Anemia, chronic disease   Anxiety state   Anemia of chronic disease   Asthma, chronic   Acute kidney injury superimposed on stage 5 chronic kidney disease, not on chronic dialysis (HCC)   Acute encephalopathy   Hypothermia          Assessment: 89 YF with CKD V, HTN, astham, CHF admitted with acute encepholopathy.  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 Went to UC on 5/31 and Rx valtrex 1gm tid->became more encephalopathic with halluciations after 5 days. And admitted for futrher management -Valtrex held during hospitalization. Rash is not crusted over. Pt mental status improved but not at baseline. -Code stroke was called ct and mri bran negative for acute encephalopathy. EEG showed encephalopathy. Related to toxic/metabolic. Neuro felt 2/2 cefepime , valacyclovir toxicity.   #Strep parasnaguinis  bacteremia 2/2 skin breakdown #PCN allergy(intolerance) -1/4 bottles from admission + strep -Suspect due to skin breakdown. Pt adentulous.  -TTE no veg.  Will forego TEE if repeat blood cultures remain negative at this point as only 1/4 bottles grew strep, TTE did not show vegetation, we have a source.  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 For go LP at this point as pt has remained afebrile and we have a presumptive Dx. -VZV and HSV swab of lesion pending -Continue ceftriaxone   -Follow repeat blood Cx  We can ask pt about PCN allergy when more alert(N/V listed) Plan on 2 weeks of abx from negative Cx(if repeat cultures from 6/5 remain negative) given only 1/4 botlle + and TTE negative  for veg.  -I called and spoke with Grand daughter in regards to plan as well.   Dr. Levern Reader is covering this weekend. I will be back on service on Monday  Evaluation of this patient requires complex antimicrobial therapy evaluation and counseling + isolation needs for disease transmission risk assessment and mitigation    Microbiology:   Antibiotics: Cefepime 6/2-3 Ctx 6/4- Doxy 6/1-4 Vanc 6/2   Cultures: Blood 1/4 bottles strep parasanguinis   SUBJECTIVE: Resting bed. Gdaughter at bedside.   Interval:   Review of Systems: Review of Systems  All other systems reviewed and are negative.    Scheduled Meds:  amLODipine   10 mg Oral QHS   carvedilol   12.5 mg Oral BID   Chlorhexidine  Gluconate Cloth  6 each Topical Daily   citalopram   10 mg Oral QHS   cloNIDine   0.1 mg Transdermal Weekly   cyanocobalamin   1,000 mcg Oral Daily   feeding supplement (NEPRO CARB STEADY)  237 mL Oral BID BM   ferrous sulfate   325 mg Oral Q breakfast   heparin   5,000 Units Subcutaneous Q8H   lidocaine   1 patch Transdermal Q24H   montelukast   10 mg Oral QHS   sodium bicarbonate   650 mg Oral BID   sodium chloride  flush  3-10 mL Intravenous Q12H   torsemide   40 mg Oral BID   Continuous Infusions:  acyclovir Stopped (10/18/23 1616)   cefTRIAXone  (ROCEPHIN )  IV Stopped (10/19/23 0923)   lactated ringers  50 mL/hr at 10/19/23 0957   PRN Meds:.acetaminophen  **OR** acetaminophen , albuterol ,  artificial tears, budesonide -glycopyrrolate -formoterol , hydrALAZINE , mouth rinse, sodium chloride  flush Allergies  Allergen Reactions   Penicillins Nausea And Vomiting   Asa [Aspirin] Other (See Comments)    Wheezing Acetaminophen  is OK    Fish Allergy Itching, Nausea And Vomiting and Swelling    Swelling of the face    OBJECTIVE: Vitals:   10/19/23 0600 10/19/23 0700 10/19/23 0800 10/19/23 0900  BP:   (!) 178/49 (!) 162/79  Pulse: 66 66 64 65  Resp: 16 16 15 14   Temp: (!) 97.5 F (36.4 C) 97.8 F  (36.6 C)    TempSrc:      SpO2: 96% 97% 97% 98%  Weight:      Height:       Body mass index is 24.84 kg/m.  Physical Exam Constitutional:      Appearance: Normal appearance.  HENT:     Head: Normocephalic and atraumatic.     Right Ear: Tympanic membrane normal.     Left Ear: Tympanic membrane normal.     Nose: Nose normal.     Mouth/Throat:     Mouth: Mucous membranes are moist.  Eyes:     Extraocular Movements: Extraocular movements intact.     Conjunctiva/sclera: Conjunctivae normal.     Pupils: Pupils are equal, round, and reactive to light.  Cardiovascular:     Rate and Rhythm: Normal rate and regular rhythm.     Heart sounds: No murmur heard.    No friction rub. No gallop.  Pulmonary:     Effort: Pulmonary effort is normal.     Breath sounds: Normal breath sounds.  Abdominal:     General: Abdomen is flat.     Palpations: Abdomen is soft.  Musculoskeletal:        General: Normal range of motion.  Skin:    General: Skin is warm and dry.     Comments: Rah rt neck and chest  Neurological:     General: No focal deficit present.  Psychiatric:        Mood and Affect: Mood normal.       Lab Results Lab Results  Component Value Date   WBC 3.6 (L) 10/17/2023   HGB 10.8 (L) 10/17/2023   HCT 35.5 (L) 10/17/2023   MCV 97.5 10/17/2023   PLT 204 10/17/2023    Lab Results  Component Value Date   CREATININE 5.20 (H) 10/17/2023   BUN 79 (H) 10/17/2023   NA 137 10/17/2023   K 4.3 10/17/2023   CL 106 10/17/2023   CO2 17 (L) 10/17/2023    Lab Results  Component Value Date   ALT 8 10/15/2023   AST 16 10/15/2023   ALKPHOS 37 (L) 10/15/2023   BILITOT 0.8 10/15/2023        Orlie Bjornstad, MD [facility] 10/19/2023, 12:10 PM

## 2023-10-19 NOTE — Progress Notes (Signed)
  Echocardiogram 2D Echocardiogram has been performed.  Fain Home RDCS 10/19/2023, 9:17 AM

## 2023-10-19 NOTE — NC FL2 (Signed)
 [place]  MEDICAID FL2 LEVEL OF CARE FORM     IDENTIFICATION  Patient Name: [name] Birthdate: Dec 04, 1933 Sex: female Admission Date (Current Location): 10/14/2023  [facility] and [place] Number:  Producer, television/film/video and Address:  [facility],  [address] [place], [place] [address]      Provider Number: [id number]  Attending Physician Name and Address:  Ozell Blunt, MD  Relative Name and Phone Number:  Eloyce, Bultman (Granddaughter)  [phone]    Current Level of Care: Hospital Recommended Level of Care: Skilled Nursing Facility Prior Approval Number:    Date Approved/Denied:   PASRR Number: [id number]  Discharge Plan: SNF    Current Diagnoses: Patient Active Problem List   Diagnosis Date Noted   Hypothermia 10/15/2023   Sepsis (HCC) 10/15/2023   Adverse effect of valaciclovir 10/14/2023   Acute encephalopathy 10/14/2023   Acute exacerbation of CHF (congestive heart failure) (HCC) 09/22/2023   Acute kidney injury superimposed on stage 5 chronic kidney disease, not on chronic dialysis (HCC) 08/08/2023   Hypertensive nephrosclerosis 08/08/2023   Anemia of chronic kidney failure, stage 5 (HCC) 08/08/2023   Metabolic acidosis 08/08/2023   Acute exacerbation of moderate persistent extrinsic asthma 08/08/2023   Acute on chronic heart failure with preserved ejection fraction (HFpEF) (HCC) 08/08/2023   AKI (acute kidney injury) (HCC) 08/08/2023   CAP (community acquired pneumonia) 10/31/2022   Hypertensive urgency 10/31/2022   Moderate persistent asthma 10/31/2022   Hypomagnesemia 10/31/2022   CKD (chronic kidney disease), stage IV (HCC) 10/31/2022   Pulmonary hypertension, unspecified (HCC) 12/06/2021   Chronic cough 10/18/2021   Chronic rhinitis 11/24/2019   Nausea 11/03/2018   Elevated troponin 11/03/2018   Hyponatremia 11/03/2018   Hypertensive heart disease with hypertensive chronic kidney disease 11/30/2017   Fasting hyperglycemia  11/30/2017   Renal cyst 03/27/2016   CKD (chronic kidney disease) stage 3, GFR 30-59 ml/min (HCC) 08/23/2015   Subjective hearing change 06/29/2014   Resistant hypertension 06/29/2014   Lumbago 06/15/2014   Pre-diabetes vs early DM  06/15/2014   Dyspnea 04/27/2014   Asthma, chronic 04/13/2014   Elevated uric acid in blood 04/13/2014   Essential hypertension 03/27/2014   History of DVT (deep vein thrombosis)    Palpitations 01/05/2014   Anemia of chronic disease 12-25-13   Death of family member 08-29-13   Leg edema 06/20/2013   Bereavement due to life event 04/21/2013   Other dysphagia 02/11/2013   Colon cancer screening 02/11/2013   Anxiety state 01/20/2013   Leg cramps 01/20/2013   Back pain 12/05/2012   Family hx of colon cancer 10/21/2012   Family hx of lung cancer 10/21/2012   Family hx-breast malignancy 10/21/2012   Renal insufficiency creatinine 1.3 10/21/2012   Anemia, chronic disease 10/21/2012   GERD (gastroesophageal reflux disease) 09/08/2012   HTN (hypertension) 09/08/2012   Hyperlipidemia 09/08/2012   Varicose veins of leg with complications 12/05/2010    Orientation RESPIRATION BLADDER Height & Weight     Self, Situation, Place  Normal Incontinent Weight: 61.6 kg Height:  5\' 2"  (157.5 cm)  BEHAVIORAL SYMPTOMS/MOOD NEUROLOGICAL BOWEL NUTRITION STATUS      Incontinent Diet (Dysphagia 3)  AMBULATORY STATUS COMMUNICATION OF NEEDS Skin   Extensive Assist Verbally Other (Comment) (Blister on chest, shoulder, arm, back)                       Personal Care Assistance Level of Assistance  Bathing, Feeding, Dressing Bathing Assistance: Maximum assistance Feeding assistance:  Independent Dressing Assistance: Maximum assistance     Functional Limitations Info  Sight, Hearing, Speech Sight Info: Impaired Hearing Info: Adequate Speech Info: Adequate    SPECIAL CARE FACTORS FREQUENCY  OT (By licensed OT), PT (By licensed PT)     PT Frequency:  5xwk OT Frequency: 5xwk            Contractures Contractures Info: Not present    Additional Factors Info  Psychotropic Code Status Info: FULL Allergies Info: Penicillins, Asa (aspirin), Fish Allergy Psychotropic Info: Citalopram  (CELEXA ) tablet 10 mg         Current Medications (10/19/2023):  This is the current hospital active medication list Current Facility-Administered Medications  Medication Dose Route Frequency Provider Last Rate Last Admin   acetaminophen  (TYLENOL ) tablet 650 mg  650 mg Oral Q6H PRN Kakrakandy, Arshad N, MD   650 mg at 10/15/23 2324   Or   acetaminophen  (TYLENOL ) suppository 650 mg  650 mg Rectal Q6H PRN Kakrakandy, Arshad N, MD       acyclovir (ZOVIRAX) 300 mg in dextrose  5 % 100 mL IVPB  300 mg Intravenous Q24H Racheal Buddle, [facility]   Stopped at 10/18/23 1616   albuterol  (PROVENTIL ) (2.5 MG/3ML) 0.083% nebulizer solution 2.5 mg  2.5 mg Nebulization Q6H PRN Angelene Kelly, MD       amLODipine  (NORVASC ) tablet 10 mg  10 mg Oral QHS Vann, Jessica U, DO   10 mg at 10/18/23 2154   artificial tears ophthalmic solution 2 drop  2 drop Both Eyes PRN Vann, Jessica U, DO       budesonide -glycopyrrolate -formoterol  (BREZTRI ) 160-9-4.8 MCG/ACT inhaler 2 puff  2 puff Inhalation BID PRN Angelene Kelly, MD       carvedilol  (COREG ) tablet 12.5 mg  12.5 mg Oral BID Vann, Jessica U, DO   12.5 mg at 10/19/23 0854   cefTRIAXone  (ROCEPHIN ) 2 g in sodium chloride  0.9 % 100 mL IVPB  2 g Intravenous Q24H Vann, Jessica U, DO   Stopped at 10/19/23 1610   Chlorhexidine  Gluconate Cloth 2 % PADS 6 each  6 each Topical Daily Vann, Jessica U, DO   6 each at 10/19/23 9604   citalopram  (CELEXA ) tablet 10 mg  10 mg Oral QHS Kakrakandy, Arshad N, MD   10 mg at 10/18/23 2154   cloNIDine  (CATAPRES  - Dosed in mg/24 hr) patch 0.1 mg  0.1 mg Transdermal Weekly Daniels, James K, NP   0.1 mg at 10/16/23 2139   cyanocobalamin  (VITAMIN B12) tablet 1,000 mcg  1,000 mcg Oral Daily  Kakrakandy, Arshad N, MD   1,000 mcg at 10/19/23 0853   feeding supplement (NEPRO CARB STEADY) liquid 237 mL  237 mL Oral BID BM Vann, Jessica U, DO   237 mL at 10/19/23 0858   ferrous sulfate  tablet 325 mg  325 mg Oral Q breakfast Kakrakandy, Arshad N, MD   325 mg at 10/19/23 0853   heparin  injection 5,000 Units  5,000 Units Subcutaneous Q8H Angelene Kelly, MD   5,000 Units at 10/19/23 5409   hydrALAZINE  (APRESOLINE ) injection 10 mg  10 mg Intravenous Q4H PRN Lama, Gagan S, MD   10 mg at 10/18/23 1737   lactated ringers  infusion   Intravenous Continuous Racheal Buddle, RPH 50 mL/hr at 10/19/23 0957 Infusion Verify at 10/19/23 0957   lidocaine  (LIDODERM ) 5 % 1 patch  1 patch Transdermal Q24H Angelene Kelly, MD   1 patch at 10/18/23 2212   montelukast  (SINGULAIR ) tablet 10  mg  10 mg Oral QHS Kakrakandy, Arshad N, MD   10 mg at 10/18/23 2155   Oral care mouth rinse  15 mL Mouth Rinse PRN Vann, Jessica U, DO       sodium bicarbonate  tablet 650 mg  650 mg Oral BID Angelene Kelly, MD   650 mg at 10/19/23 0981   sodium chloride  flush (NS) 0.9 % injection 3-10 mL  3-10 mL Intravenous Q12H Ozell Blunt, MD   10 mL at 10/19/23 1914   sodium chloride  flush (NS) 0.9 % injection 3-10 mL  3-10 mL Intravenous PRN Ozell Blunt, MD       torsemide  (DEMADEX ) tablet 40 mg  40 mg Oral BID Lama, Gagan S, MD   40 mg at 10/19/23 7829     Discharge Medications: Please see discharge summary for a list of discharge medications.  Relevant Imaging Results:  Relevant Lab Results:   Additional Information [id number]  Tessie Fila, RN

## 2023-10-19 NOTE — TOC Progression Note (Signed)
 Transition of Care [facility]) - Progression Note    Patient Details  Name: [name] MRN: [id number] Date of Birth: 1934/02/08  [facility] CM/SW Contact  Angela Fila, RN Phone Number: 10/19/2023, 10:57 AM  Clinical Narrative:    Pt has PT recommendation for SNF. NCM called pt granddaughter [name] at [phone] to discuss pt recommendation for SNF. Pt granddaughter is in agreement with this recommendation. She states when pt was previously admitted they accepted a bed at [facility] and then at discharge decided to go home instead. She is certain this admission that pt would greatly benefit from STR and then return home.  Referrals sent for SNF placement, awaiting bed offers. TOC following.   Expected Discharge Plan: Home w Home Health Services Barriers to Discharge: Continued Medical Work up  Expected Discharge Plan and Services In-house Referral: NA Discharge Planning Services: CM Consult Post Acute Care Choice: Home Health Living arrangements for the past 2 months: Apartment                 DME Arranged: N/A DME Agency: NA       HH Arranged: PT, OT, Social Work HH Agency: [facility] Date HH Agency Contacted: 10/16/23 Time HH Agency Contacted: 1554 Representative spoke with at [facility] Agency: Brandi with [employer]   Social Determinants of Health (SDOH) Interventions SDOH Screenings   Food Insecurity: Patient Unable To Answer (10/16/2023)  Recent Concern: Food Insecurity - Food Insecurity Present (08/08/2023)  Housing: Patient Unable To Answer (10/16/2023)  Transportation Needs: Patient Unable To Answer (10/16/2023)  Utilities: Patient Unable To Answer (10/16/2023)  Alcohol Screen: Low Risk  (09/21/2023)  Depression (PHQ2-9): Low Risk  (09/21/2023)  Financial Resource Strain: Low Risk  (09/21/2023)  Physical Activity: Sufficiently Active (09/21/2023)  Social Connections: Patient Unable To Answer (10/16/2023)  Recent Concern: Social  Connections - Moderately Isolated (08/08/2023)  Stress: No Stress Concern Present (09/21/2023)  Tobacco Use: Low Risk  (10/14/2023)  Health Literacy: Adequate Health Literacy (09/21/2023)    Readmission Risk Interventions    10/16/2023    3:51 PM 08/08/2023    3:21 PM 11/02/2022   12:32 PM  Readmission Risk Prevention Plan  Transportation Screening Complete  Complete  Medication Review Oceanographer) Complete Complete Complete  PCP or Specialist appointment within 3-5 days of discharge Complete Complete Complete  HRI or Home Care Consult Complete Complete Complete  SW Recovery Care/Counseling Consult Complete  Complete  Palliative Care Screening Not Applicable  Not Applicable  Skilled Nursing Facility Complete Complete Not Applicable

## 2023-10-20 DIAGNOSIS — A419 Sepsis, unspecified organism: Secondary | ICD-10-CM | POA: Diagnosis not present

## 2023-10-20 DIAGNOSIS — T375X5A Adverse effect of antiviral drugs, initial encounter: Secondary | ICD-10-CM | POA: Diagnosis not present

## 2023-10-20 DIAGNOSIS — B028 Zoster with other complications: Secondary | ICD-10-CM | POA: Diagnosis not present

## 2023-10-20 DIAGNOSIS — R110 Nausea: Secondary | ICD-10-CM | POA: Diagnosis not present

## 2023-10-20 LAB — CULTURE, BLOOD (ROUTINE X 2): Culture: NO GROWTH

## 2023-10-20 LAB — COMPREHENSIVE METABOLIC PANEL WITH GFR
ALT: 6 U/L (ref 0–44)
AST: 14 U/L — ABNORMAL LOW (ref 15–41)
Albumin: 2.2 g/dL — ABNORMAL LOW (ref 3.5–5.0)
Alkaline Phosphatase: 32 U/L — ABNORMAL LOW (ref 38–126)
Anion gap: 10 (ref 5–15)
BUN: 79 mg/dL — ABNORMAL HIGH (ref 8–23)
CO2: 20 mmol/L — ABNORMAL LOW (ref 22–32)
Calcium: 7.8 mg/dL — ABNORMAL LOW (ref 8.9–10.3)
Chloride: 106 mmol/L (ref 98–111)
Creatinine, Ser: 5.09 mg/dL — ABNORMAL HIGH (ref 0.44–1.00)
GFR, Estimated: 8 mL/min — ABNORMAL LOW (ref >60)
Glucose, Bld: 109 mg/dL — ABNORMAL HIGH (ref 70–99)
Potassium: 4 mmol/L (ref 3.5–5.1)
Sodium: 136 mmol/L (ref 135–145)
Total Bilirubin: 0.5 mg/dL (ref 0.0–1.2)
Total Protein: 4.9 g/dL — ABNORMAL LOW (ref 6.5–8.1)

## 2023-10-20 LAB — CBC
HCT: 29.8 % — ABNORMAL LOW (ref 36.0–46.0)
Hemoglobin: 9.2 g/dL — ABNORMAL LOW (ref 12.0–15.0)
MCH: 30.4 pg (ref 26.0–34.0)
MCHC: 30.9 g/dL (ref 30.0–36.0)
MCV: 98.3 fL (ref 80.0–100.0)
Platelets: 146 10*3/uL — ABNORMAL LOW (ref 150–400)
RBC: 3.03 MIL/uL — ABNORMAL LOW (ref 3.87–5.11)
RDW: 14.6 % (ref 11.5–15.5)
WBC: 3.8 10*3/uL — ABNORMAL LOW (ref 4.0–10.5)
nRBC: 1.1 % — ABNORMAL HIGH (ref 0.0–0.2)

## 2023-10-20 NOTE — Plan of Care (Signed)
  Problem: Respiratory: Goal: Ability to maintain adequate ventilation will improve Outcome: Progressing   Problem: Education: Goal: Knowledge of General Education information will improve Description: Including pain rating scale, medication(s)/side effects and non-pharmacologic comfort measures Outcome: Progressing   Problem: Health Behavior/Discharge Planning: Goal: Ability to manage health-related needs will improve Outcome: Progressing   

## 2023-10-20 NOTE — Progress Notes (Signed)
 [facility]  PROGRESS NOTE  [name] ZOX:[id number] DOB: July 19, 1933 DOA: 10/14/2023 PCP: Reginal Capra, MD   Brief HPI:   89 y.o. female with history of chronic kidney disease stage V, hypertension, asthma, anemia, CHF recently admitted for respiratory failure secondary to acute CHF discharge about 2 weeks ago was brought to the ER after patient's family noticed patient was getting increasingly confused.  About 2 days ago patient started developing right neck and right upper chest vesicular lesions and yesterday had gone to urgent care and was prescribed Valtrex  for herpes zoster.  Patient has so far taken 5 doses of Valtrex  1 g.  Patient became confused was hallucinating and was talking about things which were not there.     Assessment/Plan:   Left-sided facial droop -Resolved - Code stroke was called, CT head negative for stroke. - Neurology consulted, recommended MRI brain stat - MRI brain was negative  Sepsis /bacteremia - X-ray showed infiltrates.  - 1/4 bottles growing Streptococcus para sanguinous - broad-spectrum antibiotics cultures ordered.   -Continue ceftriaxone  -ID consulted, TTE negative for vegetation -ID recommends 2 weeks of IV ceftriaxone  from date of negative culture, last culture from 6/5 is negative to date   Acute encephalopathy/delirium -Improved - Delirium precautions - Suspect due to Valtrex  toxicity - Significantly improved     ?Herpes zoster  -was given Valtrex  at UC and dose not adjusted for renal failure - Hold due to toxicity-based on dosing and kidney function suspect she could remain confused for up to a week - ID consulted; started on acyclovir  -Follow results of VZV and HSV swab   Chronic kidney disease stage V  -with metabolic acidosis  - bicarbonate   Hypertension  -Blood pressure has been elevated -Continue Coreg , amlodipine  -Start IV hydralazine  10 mg every 4 hours as needed -Catapres  0.1 mg patch q. 7 days   History  of CHF  - Torsemide  has been restarted   Anemia  -due to renal disease - follow CBC   History of asthma  -presently not wheezing.   -Continue home inhalers.   History of anxiety and depression  -on Celexa  and as needed Ativan .    Medications     amLODipine   10 mg Oral QHS   carvedilol   12.5 mg Oral BID   Chlorhexidine  Gluconate Cloth  6 each Topical Daily   citalopram   10 mg Oral QHS   cloNIDine   0.1 mg Transdermal Weekly   cyanocobalamin   1,000 mcg Oral Daily   feeding supplement (NEPRO CARB STEADY)  237 mL Oral BID BM   ferrous sulfate   325 mg Oral Q breakfast   heparin   5,000 Units Subcutaneous Q8H   lidocaine   1 patch Transdermal Q24H   montelukast   10 mg Oral QHS   sodium bicarbonate   650 mg Oral BID   sodium chloride  flush  3-10 mL Intravenous Q12H   torsemide   40 mg Oral BID     Data Reviewed:   CBG:  Recent Labs  Lab 10/17/23 0838  GLUCAP 100*    SpO2: 97 %    Vitals:   10/20/23 0434 10/20/23 0500 10/20/23 0600 10/20/23 0641  BP:   (!) 168/45   Pulse: 69 63 62 64  Resp: 19 20 17 16   Temp: 99.3 F (37.4 C)     TempSrc:      SpO2: 99% 99% 97%   Weight: 60.2 kg     Height:          Data Reviewed:  Basic Metabolic Panel: Recent Labs  Lab 10/14/23 1720 10/14/23 2309 10/15/23 0232 10/16/23 0307 10/17/23 0313 10/20/23 0311  NA 134*  --  131* 135 137 136  K 4.3  --  4.0 4.3 4.3 4.0  CL 100  --  100 107 106 106  CO2 20*  --  19* 18* 17* 20*  GLUCOSE 130*  --  133* 100* 110* 109*  BUN 80*  --  80* 77* 79* 79*  CREATININE 5.97* 5.50* 5.64* 5.43* 5.20* 5.09*  CALCIUM  8.4*  --  8.2* 8.2* 8.1* 7.8*    CBC: Recent Labs  Lab 10/14/23 1720 10/15/23 0232 10/16/23 0307 10/17/23 0313 10/20/23 0311  WBC 2.7* 1.9* 3.0* 3.6* 3.8*  HGB 9.9* 9.4* 10.1* 10.8* 9.2*  HCT 32.8* 31.1* 33.6* 35.5* 29.8*  MCV 98.2 98.1 98.8 97.5 98.3  PLT 202 194 179 204 146*    LFT Recent Labs  Lab 10/14/23 1720 10/15/23 0232 10/20/23 0311  AST 21 16  14*  ALT 8 8 6   ALKPHOS 40 37* 32*  BILITOT 0.7 0.8 0.5  PROT 6.2* 5.7* 4.9*  ALBUMIN  2.9* 2.7* 2.2*     Antibiotics: Anti-infectives (From admission, onward)    Start     Dose/Rate Route Frequency Ordered Stop   10/18/23 1500  acyclovir  (ZOVIRAX ) 300 mg in dextrose  5 % 100 mL IVPB        300 mg 106 mL/hr over 60 Minutes Intravenous Every 24 hours 10/18/23 1412     10/17/23 0800  cefTRIAXone  (ROCEPHIN ) 2 g in sodium chloride  0.9 % 100 mL IVPB        2 g 200 mL/hr over 30 Minutes Intravenous Every 24 hours 10/16/23 0915     10/15/23 0800  ceFEPIme  (MAXIPIME ) 1 g in sodium chloride  0.9 % 100 mL IVPB  Status:  Discontinued        1 g 200 mL/hr over 30 Minutes Intravenous Every 24 hours 10/15/23 0655 10/16/23 0915   10/15/23 0606  vancomycin  variable dose per unstable renal function (pharmacist dosing)  Status:  Discontinued         Does not apply See admin instructions 10/15/23 0606 10/16/23 0915   10/15/23 0600  vancomycin  (VANCOREADY) IVPB 1250 mg/250 mL        1,250 mg 166.7 mL/hr over 90 Minutes Intravenous  Once 10/15/23 0557 10/15/23 1019   10/15/23 0400  cefTRIAXone  (ROCEPHIN ) 2 g in sodium chloride  0.9 % 100 mL IVPB  Status:  Discontinued        2 g 200 mL/hr over 30 Minutes Intravenous Every 24 hours 10/15/23 0347 10/15/23 0638   10/15/23 0400  doxycycline  (VIBRAMYCIN ) 100 mg in sodium chloride  0.9 % 250 mL IVPB  Status:  Discontinued        100 mg 125 mL/hr over 120 Minutes Intravenous Every 12 hours 10/15/23 0348 10/18/23 1345        DVT prophylaxis: Heparin   Code Status: Full code  Family Communication: No family at bedside   CONSULTS    Subjective   Denies any complaints.   Objective    Physical Examination:  Appears in no acute distress S1-S2, regular, no murmur auscultated Abdomen is soft, nontender Skin-papules noted on neck and right upper chest   Status is: Inpatient:             [name]   [facility] If 7PM-7AM,  please contact night-coverage at [email or web address], Office  [phone]   10/20/2023, 8:14 AM  LOS: 5 days

## 2023-10-21 DIAGNOSIS — B028 Zoster with other complications: Secondary | ICD-10-CM | POA: Diagnosis not present

## 2023-10-21 DIAGNOSIS — N179 Acute kidney failure, unspecified: Secondary | ICD-10-CM | POA: Diagnosis not present

## 2023-10-21 DIAGNOSIS — A419 Sepsis, unspecified organism: Secondary | ICD-10-CM | POA: Diagnosis not present

## 2023-10-21 DIAGNOSIS — R531 Weakness: Secondary | ICD-10-CM | POA: Diagnosis not present

## 2023-10-21 LAB — COMPREHENSIVE METABOLIC PANEL WITH GFR
ALT: 6 U/L (ref 0–44)
AST: 13 U/L — ABNORMAL LOW (ref 15–41)
Albumin: 2.2 g/dL — ABNORMAL LOW (ref 3.5–5.0)
Alkaline Phosphatase: 32 U/L — ABNORMAL LOW (ref 38–126)
Anion gap: 12 (ref 5–15)
BUN: 82 mg/dL — ABNORMAL HIGH (ref 8–23)
CO2: 21 mmol/L — ABNORMAL LOW (ref 22–32)
Calcium: 7.9 mg/dL — ABNORMAL LOW (ref 8.9–10.3)
Chloride: 106 mmol/L (ref 98–111)
Creatinine, Ser: 5.31 mg/dL — ABNORMAL HIGH (ref 0.44–1.00)
GFR, Estimated: 7 mL/min — ABNORMAL LOW (ref >60)
Glucose, Bld: 106 mg/dL — ABNORMAL HIGH (ref 70–99)
Potassium: 4.1 mmol/L (ref 3.5–5.1)
Sodium: 139 mmol/L (ref 135–145)
Total Bilirubin: 0.5 mg/dL (ref 0.0–1.2)
Total Protein: 5.1 g/dL — ABNORMAL LOW (ref 6.5–8.1)

## 2023-10-21 LAB — CBC
HCT: 28.6 % — ABNORMAL LOW (ref 36.0–46.0)
Hemoglobin: 8.9 g/dL — ABNORMAL LOW (ref 12.0–15.0)
MCH: 30.5 pg (ref 26.0–34.0)
MCHC: 31.1 g/dL (ref 30.0–36.0)
MCV: 97.9 fL (ref 80.0–100.0)
Platelets: 144 10*3/uL — ABNORMAL LOW (ref 150–400)
RBC: 2.92 MIL/uL — ABNORMAL LOW (ref 3.87–5.11)
RDW: 14.4 % (ref 11.5–15.5)
WBC: 4.3 10*3/uL (ref 4.0–10.5)
nRBC: 0.7 % — ABNORMAL HIGH (ref 0.0–0.2)

## 2023-10-21 NOTE — Progress Notes (Signed)
 [facility]  PROGRESS NOTE  Aleene Swanner WUJ:[id number] DOB: November 12, 1933 DOA: 10/14/2023 PCP: Reginal Capra, MD   Brief HPI:   89 y.o. female with history of chronic kidney disease stage V, hypertension, asthma, anemia, CHF recently admitted for respiratory failure secondary to acute CHF discharge about 2 weeks ago was brought to the ER after patient's family noticed patient was getting increasingly confused.  About 2 days ago patient started developing right neck and right upper chest vesicular lesions and yesterday had gone to urgent care and was prescribed Valtrex  for herpes zoster.  Patient has so far taken 5 doses of Valtrex  1 g.  Patient became confused was hallucinating and was talking about things which were not there.     Assessment/Plan:   Left-sided facial droop -Resolved - Code stroke was called, CT head negative for stroke. - Neurology consulted, recommended MRI brain stat - MRI brain was negative  Sepsis /bacteremia - X-ray showed infiltrates.  - 1/4 bottles growing Streptococcus para sanguinous - broad-spectrum antibiotics cultures ordered.   -Continue ceftriaxone  -ID consulted, TTE negative for vegetation -ID recommends 2 weeks of IV ceftriaxone  from date of negative culture, last culture from 6/5 is negative to date   Acute encephalopathy/delirium -Improved - Delirium precautions - Suspect due to Valtrex  toxicity - Significantly improved     Herpes zoster  -was given Valtrex  at UC and dose not adjusted for renal failure - Hold due to toxicity-based on dosing and kidney function suspect she could remain confused for up to a week - ID consulted; started on acyclovir  -Follow results of VZV and HSV-1 DNA, HSV-2 DNA were not detected   Chronic kidney disease stage V  -with metabolic acidosis  - bicarbonate   Hypertension  -Blood pressure has been elevated -Continue Coreg , amlodipine  -Start IV hydralazine  10 mg every 4 hours as needed -Catapres  0.1  mg patch q. 7 days   History of CHF  - Torsemide  has been restarted   Anemia  -due to renal disease - follow CBC   History of asthma  -presently not wheezing.   -Continue home inhalers.   History of anxiety and depression  -on Celexa  and as needed Ativan .    Medications     amLODipine   10 mg Oral QHS   carvedilol   12.5 mg Oral BID   Chlorhexidine  Gluconate Cloth  6 each Topical Daily   citalopram   10 mg Oral QHS   cloNIDine   0.1 mg Transdermal Weekly   cyanocobalamin   1,000 mcg Oral Daily   feeding supplement (NEPRO CARB STEADY)  237 mL Oral BID BM   ferrous sulfate   325 mg Oral Q breakfast   heparin   5,000 Units Subcutaneous Q8H   lidocaine   1 patch Transdermal Q24H   montelukast   10 mg Oral QHS   sodium bicarbonate   650 mg Oral BID   sodium chloride  flush  3-10 mL Intravenous Q12H   torsemide   40 mg Oral BID     Data Reviewed:   CBG:  Recent Labs  Lab 10/17/23 0838  GLUCAP 100*    SpO2: 96 %    Vitals:   10/20/23 1536 10/20/23 2005 10/20/23 2253 10/21/23 0008  BP: (!) 151/66 (!) 151/72  (!) 145/70  Pulse: (!) 57 64  70  Resp: 16 18    Temp: 97.9 F (36.6 C) 98.4 F (36.9 C)  98.5 F (36.9 C)  TempSrc: Oral Oral  Oral  SpO2: 99% 99% 98% 96%  Weight:  Height:          Data Reviewed:  Basic Metabolic Panel: Recent Labs  Lab 10/15/23 0232 10/16/23 0307 10/17/23 0313 10/20/23 0311 10/21/23 0335  NA 131* 135 137 136 139  K 4.0 4.3 4.3 4.0 4.1  CL 100 107 106 106 106  CO2 19* 18* 17* 20* 21*  GLUCOSE 133* 100* 110* 109* 106*  BUN 80* 77* 79* 79* 82*  CREATININE 5.64* 5.43* 5.20* 5.09* 5.31*  CALCIUM  8.2* 8.2* 8.1* 7.8* 7.9*    CBC: Recent Labs  Lab 10/15/23 0232 10/16/23 0307 10/17/23 0313 10/20/23 0311 10/21/23 0335  WBC 1.9* 3.0* 3.6* 3.8* 4.3  HGB 9.4* 10.1* 10.8* 9.2* 8.9*  HCT 31.1* 33.6* 35.5* 29.8* 28.6*  MCV 98.1 98.8 97.5 98.3 97.9  PLT 194 179 204 146* 144*    LFT Recent Labs  Lab 10/14/23 1720  10/15/23 0232 10/20/23 0311 10/21/23 0335  AST 21 16 14* 13*  ALT 8 8 6 6   ALKPHOS 40 37* 32* 32*  BILITOT 0.7 0.8 0.5 0.5  PROT 6.2* 5.7* 4.9* 5.1*  ALBUMIN  2.9* 2.7* 2.2* 2.2*     Antibiotics: Anti-infectives (From admission, onward)    Start     Dose/Rate Route Frequency Ordered Stop   10/18/23 1500  acyclovir  (ZOVIRAX ) 300 mg in dextrose  5 % 100 mL IVPB        300 mg 106 mL/hr over 60 Minutes Intravenous Every 24 hours 10/18/23 1412     10/17/23 0800  cefTRIAXone  (ROCEPHIN ) 2 g in sodium chloride  0.9 % 100 mL IVPB        2 g 200 mL/hr over 30 Minutes Intravenous Every 24 hours 10/16/23 0915     10/15/23 0800  ceFEPIme  (MAXIPIME ) 1 g in sodium chloride  0.9 % 100 mL IVPB  Status:  Discontinued        1 g 200 mL/hr over 30 Minutes Intravenous Every 24 hours 10/15/23 0655 10/16/23 0915   10/15/23 0606  vancomycin  variable dose per unstable renal function (pharmacist dosing)  Status:  Discontinued         Does not apply See admin instructions 10/15/23 0606 10/16/23 0915   10/15/23 0600  vancomycin  (VANCOREADY) IVPB 1250 mg/250 mL        1,250 mg 166.7 mL/hr over 90 Minutes Intravenous  Once 10/15/23 0557 10/15/23 1019   10/15/23 0400  cefTRIAXone  (ROCEPHIN ) 2 g in sodium chloride  0.9 % 100 mL IVPB  Status:  Discontinued        2 g 200 mL/hr over 30 Minutes Intravenous Every 24 hours 10/15/23 0347 10/15/23 0638   10/15/23 0400  doxycycline  (VIBRAMYCIN ) 100 mg in sodium chloride  0.9 % 250 mL IVPB  Status:  Discontinued        100 mg 125 mL/hr over 120 Minutes Intravenous Every 12 hours 10/15/23 0348 10/18/23 1345        DVT prophylaxis: Heparin   Code Status: Full code  Family Communication: No family at bedside   CONSULTS    Subjective   Denies any complaints.   Objective    Physical Examination:  General-appears in no acute distress Heart-S1-S2, regular, no murmur auscultated Lungs-clear to auscultation bilaterally, no wheezing or crackles  auscultated Abdomen-soft, nontender, no organomegaly Skin-dried lesions noted on the neck and right upper chest wall Extremities-no edema in the lower extremities Neuro-alert, oriented x3, no focal deficit noted   Status is: Inpatient:             [place]  [facility] If 7PM-7AM, please contact night-coverage at [email or web address], Office  [phone]   10/21/2023, 7:38 AM  LOS: 6 days

## 2023-10-22 ENCOUNTER — Other Ambulatory Visit (HOSPITAL_COMMUNITY): Payer: Self-pay

## 2023-10-22 DIAGNOSIS — G9340 Encephalopathy, unspecified: Secondary | ICD-10-CM | POA: Diagnosis not present

## 2023-10-22 DIAGNOSIS — T375X5A Adverse effect of antiviral drugs, initial encounter: Secondary | ICD-10-CM | POA: Diagnosis not present

## 2023-10-22 DIAGNOSIS — Q828 Other specified congenital malformations of skin: Secondary | ICD-10-CM | POA: Diagnosis not present

## 2023-10-22 DIAGNOSIS — N185 Chronic kidney disease, stage 5: Secondary | ICD-10-CM | POA: Diagnosis not present

## 2023-10-22 DIAGNOSIS — B028 Zoster with other complications: Secondary | ICD-10-CM | POA: Diagnosis not present

## 2023-10-22 DIAGNOSIS — B954 Other streptococcus as the cause of diseases classified elsewhere: Secondary | ICD-10-CM

## 2023-10-22 DIAGNOSIS — Z880 Allergy status to penicillin: Secondary | ICD-10-CM | POA: Diagnosis not present

## 2023-10-22 LAB — CREATININE, SERUM
Creatinine, Ser: 5.17 mg/dL — ABNORMAL HIGH (ref 0.44–1.00)
GFR, Estimated: 8 mL/min — ABNORMAL LOW (ref >60)

## 2023-10-22 LAB — VARICELLA-ZOSTER BY PCR: Varicella-Zoster, PCR: POSITIVE — AB

## 2023-10-22 MED ORDER — VALACYCLOVIR HCL 500 MG PO TABS
500.0000 mg | ORAL_TABLET | Freq: Every day | ORAL | 0 refills | Status: AC
  Filled 2023-10-22: qty 5, 5d supply, fill #0

## 2023-10-22 MED ORDER — VALACYCLOVIR HCL 500 MG PO TABS
500.0000 mg | ORAL_TABLET | Freq: Every day | ORAL | Status: DC
  Administered 2023-10-22 – 2023-10-24 (×3): 500 mg via ORAL
  Filled 2023-10-22 (×3): qty 1

## 2023-10-22 MED ORDER — CEPHALEXIN 500 MG PO CAPS
1000.0000 mg | ORAL_CAPSULE | ORAL | Status: DC
  Administered 2023-10-22 – 2023-10-23 (×2): 1000 mg via ORAL
  Filled 2023-10-22 (×4): qty 2

## 2023-10-22 MED ORDER — CEPHALEXIN 500 MG PO CAPS
1000.0000 mg | ORAL_CAPSULE | ORAL | 0 refills | Status: AC
  Filled 2023-10-22: qty 18, 9d supply, fill #0

## 2023-10-22 NOTE — Progress Notes (Addendum)
 [facility]  Date of Admission:  10/14/2023   Total days of inpatient antibiotics 7  Principal Problem:   Sepsis (HCC) Active Problems:   HTN (hypertension)   Hyperlipidemia   Anemia, chronic disease   Anxiety state   Anemia of chronic disease   Asthma, chronic   Acute kidney injury superimposed on stage 5 chronic kidney disease, not on chronic dialysis (HCC)   Acute encephalopathy   Hypothermia          Assessment: 89 YF with CKD V, HTN, astham, CHF admitted with acute encepholopathy.  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 Went to UC on 5/31 and Rx valtrex  1gm tid->became more encephalopathic with halluciations after 5 days. And admitted for futrher management -Valtrex  held during hospitalization. Rash is not crusted over. Pt mental status improved but not at baseline. -Code stroke was called ct and mri bran negative for acute encephalopathy. EEG showed encephalopathy. Related to toxic/metabolic. Neuro felt 2/2 cefepime  , valacyclovir  toxicity.   #Strep parasnaguinis  bacteremia 2/2 skin breakdown #PCN allergy, hives -1/4 bottles from admission + strep -Suspect due to skin breakdown. Pt adentulous.  -TTE no veg.  Will forego TEE if repeat blood cultures remain negative at this point as only 1/4 bottles grew strep, TTE did not show vegetation, we have a source. Recommendations: - Discontinue acyclovir .  Start Valtrex  renally dosed at 500 mg every 24 hours to complete total 10 days of antiviral therapy for empiric VZV meningitis/encephalitis, EOT 6/14.  I  spoke with patient today, family at bedside patient's mental status close to baseline.  Noted rash healing, mostly scabbed over - DC ceftriaxone .  Patient confirmed penicillin allergy.  She does report she gets rashes all over with penicillin.  Start Keflex to  complete 2 weeks antibiotics, renally dosed, from negative blood cultures EOT 6/18.  -Follow-up in ID clinic with myself on 6/26 for surveillance blood cultures -Plan relayed to primary, I spoke with granddaughter as well -ID will sign off  Evaluation of this patient requires complex antimicrobial therapy evaluation and counseling + isolation needs for disease transmission risk assessment and mitigation    Microbiology:   Antibiotics: Cefepime  6/2-3 Ctx 6/4- Doxy 6/1-4 Vanc 6/2 Acyclovir  6/5-present Cultures: Blood 6/2 1/4 bottles strep parasanguinis 6/5 no growth   SUBJECTIVE: Resting bed.  Bedside Interval: Afebrile overnight  Review of Systems: Review of Systems  All other systems reviewed and are negative.    Scheduled Meds:  amLODipine   10 mg Oral QHS   carvedilol   12.5 mg Oral BID   cephALEXin  1,000 mg Oral Q24H   citalopram   10 mg Oral QHS   cloNIDine   0.1 mg Transdermal Weekly   cyanocobalamin   1,000 mcg Oral Daily   feeding supplement (NEPRO CARB STEADY)  237 mL Oral BID BM   ferrous sulfate   325 mg Oral Q breakfast   heparin   5,000 Units Subcutaneous Q8H   lidocaine   1 patch Transdermal Q24H   montelukast   10 mg Oral QHS   sodium bicarbonate   650 mg Oral BID   sodium chloride  flush  3-10 mL Intravenous Q12H   torsemide   40 mg Oral BID   valACYclovir   500 mg Oral Daily   Continuous Infusions:   PRN Meds:.acetaminophen  **OR** acetaminophen , albuterol , artificial tears, budesonide -glycopyrrolate -formoterol , cloNIDine , mouth rinse, sodium chloride  flush Allergies  Allergen Reactions   Penicillins Hives   Asa [Aspirin] Other (See Comments)    Wheezing Acetaminophen  is OK    Fish Allergy Itching, Nausea And Vomiting and Swelling    Swelling of the face    OBJECTIVE: Vitals:   10/21/23 1940 10/21/23 1943 10/21/23 2207 10/22/23 0529  BP: (!) 167/61 (!) 172/74 (!) 153/54 (!) 145/69  Pulse: 65 65 63 63  Resp: 18   18  Temp: 98.6 F (37 C)   98.9  F (37.2 C)  TempSrc: Oral   Oral  SpO2: 100%   96%  Weight:      Height:       Body mass index is 24.27 kg/m.  Physical Exam Constitutional:      Appearance: Normal appearance.  HENT:     Head: Normocephalic and atraumatic.     Right Ear: Tympanic membrane normal.     Left Ear: Tympanic membrane normal.     Nose: Nose normal.     Mouth/Throat:     Mouth: Mucous membranes are moist.  Eyes:     Extraocular Movements: Extraocular movements intact.     Conjunctiva/sclera: Conjunctivae normal.     Pupils: Pupils are equal, round, and reactive to light.  Cardiovascular:     Rate and Rhythm: Normal rate and regular rhythm.     Heart sounds: No murmur heard.    No friction rub. No gallop.  Pulmonary:     Effort: Pulmonary effort is normal.     Breath sounds: Normal breath sounds.  Abdominal:     General: Abdomen is flat.     Palpations: Abdomen is soft.  Musculoskeletal:        General: Normal range of motion.  Skin:    General: Skin is warm and dry.     Comments: Rah rt neck and chest, scabbed over for the most part  Neurological:     General: No focal deficit present.  Psychiatric:        Mood and Affect: Mood normal.       Lab Results Lab Results  Component Value Date   WBC 4.3 10/21/2023   HGB 8.9 (L) 10/21/2023   HCT 28.6 (L) 10/21/2023   MCV 97.9 10/21/2023   PLT 144 (L) 10/21/2023    Lab Results  Component Value Date   CREATININE 5.17 (H) 10/22/2023   BUN 82 (H) 10/21/2023   NA 139 10/21/2023   K 4.1 10/21/2023   CL 106 10/21/2023   CO2 21 (L) 10/21/2023    Lab Results  Component Value Date   ALT 6 10/21/2023   AST 13 (L) 10/21/2023   ALKPHOS 32 (L) 10/21/2023   BILITOT 0.5 10/21/2023        Orlie Bjornstad, MD [facility] 10/22/2023, 11:18 AM

## 2023-10-22 NOTE — Progress Notes (Signed)
 Physical Therapy Treatment Patient Details Name: [name] MRN: [id number] DOB: 12-05-1933 Today's Date: 10/22/2023   History of Present Illness 89 y.o. female was brought to the ER 10/14/23 after patient's family noticed patient was getting increasingly confused. About 2 days PTA patient started developing right neck and right upper chest vesicular lesions which was getting painful and had gone to her primary care physician was prescribed Valtrex  for herpes zoster. Patient has so far taken 5 doses of Valtrex  1 g. Patient became confused was hallucinating and was talking about things which were not there. Pt with history of chronic kidney disease stage V, hypertension, asthma, anemia, CHF. Pt recently admitted 5/9-5/14/25 for respiratory failure secondary to acute CHF.    PT Comments  Pt agreeable to working with therapy. Mod-Max A for bed mobility. Pt sat EOB for ~ 5 minutes with CGA-Min A for sitting posture/balance. Pt fatigues easily. She did report pain in neck, R shoulder/UE, R upper back, bil feet with mobility. Assisted pt back to bed at end of session. Patient will benefit from continued inpatient follow up therapy, <3 hours/day     If plan is discharge home, recommend the following: Two people to help with walking and/or transfers;Two people to help with bathing/dressing/bathroom;Assistance with cooking/housework;Assist for transportation;Help with stairs or ramp for entrance   Can travel by private vehicle     No  Equipment Recommendations  Wheelchair (measurements PT);Wheelchair cushion (measurements PT);BSC/3in1;Rolling walker (2 wheels)    Recommendations for Other Services       Precautions / Restrictions Precautions Precautions: Fall Restrictions Weight Bearing Restrictions Per Provider Order: No     Mobility  Bed Mobility Overal bed mobility: Needs Assistance Bed Mobility: Rolling, Supine to Sit, Sit to Supine Rolling: Mod assist   Supine to sit: Mod assist,  HOB elevated, Used rails Sit to supine: Max assist, HOB elevated, Used rails   General bed mobility comments: Cues for safety, technique, and encouragement for pt to do as much as she could for herself. Increased time. Assist for trunk and bil LEs. Utilized bedpad to aid with scooting, repositioning. Worked on static sitting  balance    Transfers                   General transfer comment: NT-pt unable. Still too weak.    Ambulation/Gait                   Stairs             Wheelchair Mobility     Tilt Bed    Modified Rankin (Stroke Patients Only)       Balance Overall balance assessment: Needs assistance   Sitting balance-Leahy Scale: Poor Sitting balance - Comments: Intermittent Min A to correct sitting balance/posture. Pt tending to lean/prop on R elbow a bit                                    Communication Communication Communication: Impaired Factors Affecting Communication: Reduced clarity of speech;Difficulty expressing self  Cognition Arousal: Alert Behavior During Therapy: Flat affect   PT - Cognitive impairments: Problem solving, Sequencing, Initiation                       PT - Cognition Comments: flat, delayed processing; following simple commands, decreased initation Following commands: Impaired Following commands impaired: Follows one step commands with increased time  Cueing Cueing Techniques: Verbal cues, Tactile cues, Gestural cues  Exercises General Exercises - Lower Extremity Long Arc Quad: AROM, Both, 5 reps, Seated    General Comments        Pertinent Vitals/Pain Pain Assessment Pain Assessment: Faces Faces Pain Scale: Hurts even more Pain Location: R side of neck, shoulder, when moving R UE. bil feet. Pain Descriptors / Indicators: Burning, Grimacing Pain Intervention(s): Limited activity within patient's tolerance, Monitored during session, Repositioned    Home Living                           Prior Function            PT Goals (current goals can now be found in the care plan section) Progress towards PT goals: Progressing toward goals    Frequency    Min 2X/week      PT Plan      Co-evaluation              AM-PAC PT "6 Clicks" Mobility   Outcome Measure  Help needed turning from your back to your side while in a flat bed without using bedrails?: A Lot Help needed moving from lying on your back to sitting on the side of a flat bed without using bedrails?: Total Help needed moving to and from a bed to a chair (including a wheelchair)?: Total Help needed standing up from a chair using your arms (e.g., wheelchair or bedside chair)?: Total Help needed to walk in hospital room?: Total Help needed climbing 3-5 steps with a railing? : Total 6 Click Score: 7    End of Session   Activity Tolerance: Patient limited by pain;Patient limited by fatigue Patient left: in bed;with call bell/phone within reach;with bed alarm set;with family/visitor present   PT Visit Diagnosis: Muscle weakness (generalized) (M62.81);Other abnormalities of gait and mobility (R26.89)     Time: 1610-9604 PT Time Calculation (min) (ACUTE ONLY): 30 min  Charges:    $Therapeutic Activity: 23-37 mins PT General Charges $$ ACUTE PT VISIT: 1 Visit                         Tanda Falter, PT Acute Rehabilitation  Office: [phone]

## 2023-10-22 NOTE — TOC Progression Note (Addendum)
 Transition of Care [facility]) - Progression Note    Patient Details  Name: [name] MRN: [id number] Date of Birth: [birth year]  Transition of Care [facility] CM/SW Contact  Gertha Ku, LCSW Phone Number: 10/22/2023, 11:15 AM  Clinical Narrative:    CSW spoke with pt's granddaughter to present bed offers, she is requesting time to review. TOC to follow.  ADDEN  12:40pm Pt's granddaughter has chosen [facility], spoke with facility, insurance auth was started now pending. TOC to follow.     Expected Discharge Plan: Home w Home Health Services Barriers to Discharge: Continued Medical Work up  Expected Discharge Plan and Services In-house Referral: NA Discharge Planning Services: CM Consult Post Acute Care Choice: Home Health Living arrangements for the past 2 months: Apartment                 DME Arranged: N/A DME Agency: NA       HH Arranged: PT, OT, Social Work HH Agency: [facility] Date HH Agency Contacted: 10/16/23 Time HH Agency Contacted: 1554 Representative spoke with at [facility] Agency: Brandi with [employer]   Social Determinants of Health (SDOH) Interventions SDOH Screenings   Food Insecurity: Patient Unable To Answer (10/16/2023)  Recent Concern: Food Insecurity - Food Insecurity Present (08/08/2023)  Housing: Patient Unable To Answer (10/16/2023)  Transportation Needs: Patient Unable To Answer (10/16/2023)  Utilities: Patient Unable To Answer (10/16/2023)  Alcohol  Screen: Low Risk  (09/21/2023)  Depression (PHQ2-9): Low Risk  (09/21/2023)  Financial Resource Strain: Low Risk  (09/21/2023)  Physical Activity: Sufficiently Active (09/21/2023)  Social Connections: Patient Unable To Answer (10/16/2023)  Recent Concern: Social Connections - Moderately Isolated (08/08/2023)  Stress: No Stress Concern Present (09/21/2023)  Tobacco Use: Low Risk  (10/14/2023)  Health Literacy: Adequate Health Literacy (09/21/2023)    Readmission Risk Interventions    10/16/2023     3:51 PM 08/08/2023    3:21 PM 11/02/2022   12:32 PM  Readmission Risk Prevention Plan  Transportation Screening Complete  Complete  Medication Review Oceanographer) Complete Complete Complete  PCP or Specialist appointment within 3-5 days of discharge Complete Complete Complete  HRI or Home Care Consult Complete Complete Complete  SW Recovery Care/Counseling Consult Complete  Complete  Palliative Care Screening Not Applicable  Not Applicable  Skilled Nursing Facility Complete Complete Not Applicable

## 2023-10-22 NOTE — Progress Notes (Signed)
 Patient requests she gets chicken with automatic trays, please. She will not eat the meat loaf, thank you so much!

## 2023-10-22 NOTE — Progress Notes (Signed)
 Speech Language Pathology Treatment: Dysphagia  Patient Details Name: [name] MRN: [id number] DOB: 1933/10/29 Today's Date: 10/22/2023 Time: 1435-1450 SLP Time Calculation (min) (ACUTE ONLY): 15 min  Assessment / Plan / Recommendation Clinical Impression  Patient seen by SLP for skilled treatment focused on dysphagia goals. Daughter was in room and she denies any observed difficulty with swallowing but reports that patient has not been eating or drinking much. Patient told SLP that she doesn't eat much at baseline and daughter did confirm this. Patient was agreeable to sips of water and a few bites of ice cream before telling SLP "that's enough." No overt s/s aspiration and swallow appeared timely but patient did grimace each time when swallowing. She denied pain, discomfort or globus sensation when swallowing. She did endorse pain on right side of neck. SLP recommending to continue on Dys 3 (mechanical soft) solids, thin liquids. No further skilled intervention warranted at this time.    HPI HPI: Per MD note "89 y.o. female with history of chronic kidney disease stage V, hypertension, asthma, anemia, CHF recently admitted for respiratory failure secondary to acute CHF discharge about 2 weeks ago was brought to the ER after patient's family noticed patient was getting increasingly confused.  About 2 days ago patient started developing right neck and right upper chest vesicular lesions and yesterday had gone to urgent care and was prescribed Valtrex  for herpes zoster.  Patient has so far taken 5 doses of Valtrex  1 g.  Patient became confused was hallucinating and was talking about things which were not there."  Concern present for potential ongoing confusion due to toxicity from Valtrex  - pt has renal disease.  After admit to hospital, she was noted to have facial asymmetry and CT brain, MRI conducted that were both negative.  She is on airborne precautions for shingles -  DG abdomen concerning  for right lung infiltrate - although improved per notes. Pt did not pass [facility] due to coughing - thus SLP swallow eval ordered.  Pt present and both she and family deny her having baseline dysphagia.      SLP Plan  All goals met;Discharge SLP treatment due to (comment)          Recommendations  Diet recommendations: Dysphagia 3 (mechanical soft);Thin liquid Liquids provided via: Cup;Straw Medication Administration: Whole meds with puree Supervision: Staff to assist with self feeding Compensations: Slow rate;Small sips/bites;Other (Comment) Postural Changes and/or Swallow Maneuvers: Seated upright 90 degrees;Upright 30-60 min after meal                  Oral care BID   None Dysphagia, unspecified (R13.10)     All goals met;Discharge SLP treatment due to (comment)     Jacqualine Mater, MA, CCC-SLP Speech Therapy

## 2023-10-22 NOTE — Plan of Care (Signed)

## 2023-10-22 NOTE — Progress Notes (Signed)
 [facility]  PROGRESS NOTE  Lachelle Rissler HYQ:[id number] DOB: Aug 20, 1933 DOA: 10/14/2023 PCP: Reginal Capra, MD   Brief HPI:   89 y.o. female with history of chronic kidney disease stage V, hypertension, asthma, anemia, CHF recently admitted for respiratory failure secondary to acute CHF discharge about 2 weeks ago was brought to the ER after patient's family noticed patient was getting increasingly confused.  About 2 days ago patient started developing right neck and right upper chest vesicular lesions and yesterday had gone to urgent care and was prescribed Valtrex  for herpes zoster.  Patient has so far taken 5 doses of Valtrex  1 g.  Patient became confused was hallucinating and was talking about things which were not there.     Assessment/Plan:   Left-sided facial droop -Resolved - Code stroke was called, CT head negative for stroke. - Neurology consulted, recommended MRI brain stat - MRI brain was negative  Sepsis /bacteremia - X-ray showed infiltrates.  - 1/4 bottles growing Streptococcus para sanguinous - broad-spectrum antibiotics cultures ordered.   -Continue ceftriaxone  -ID consulted, TTE negative for vegetation - Patient was started on IV Rocephin . -ID recommends to discontinue Rocephin  and start Keflex to complete 2 weeks of antibiotics, end of treatment 6/18. -Patient to follow-up in ID clinic with Dr. Zelda Hickman on 6/26 for surveillance blood cultures.   Acute encephalopathy/delirium -Improved - Delirium precautions - Suspect due to Valtrex  toxicity - Significantly improved     Herpes zoster  -was given Valtrex  at UC and dose not adjusted for renal failure - Hold due to toxicity-based on dosing and kidney function suspect she could remain confused for up to a week - ID consulted; started on acyclovir  -Follow results of VZV and HSV-1 DNA, HSV-2 DNA were not detected -ID has recommended to discontinue acyclovir .  Start Valtrex , renally dosed 500 mg every 24  hours to complete 10 days of total antiviral therapy for empiric VZV meningitis/encephalitis.  End of treatment 6/14. -   Chronic kidney disease stage V  -with metabolic acidosis  - bicarbonate   Hypertension  -Blood pressure has been elevated -Continue Coreg , amlodipine  -Start IV hydralazine  10 mg every 4 hours as needed -Catapres  0.1 mg patch q. 7 days   History of CHF  - Torsemide  has been restarted   Anemia  -due to renal disease - follow CBC   History of asthma  -presently not wheezing.   -Continue home inhalers.   History of anxiety and depression  -on Celexa  and as needed Ativan .    Medications     amLODipine   10 mg Oral QHS   carvedilol   12.5 mg Oral BID   Chlorhexidine  Gluconate Cloth  6 each Topical Daily   citalopram   10 mg Oral QHS   cloNIDine   0.1 mg Transdermal Weekly   cyanocobalamin   1,000 mcg Oral Daily   feeding supplement (NEPRO CARB STEADY)  237 mL Oral BID BM   ferrous sulfate   325 mg Oral Q breakfast   heparin   5,000 Units Subcutaneous Q8H   lidocaine   1 patch Transdermal Q24H   montelukast   10 mg Oral QHS   sodium bicarbonate   650 mg Oral BID   sodium chloride  flush  3-10 mL Intravenous Q12H   torsemide   40 mg Oral BID     Data Reviewed:   CBG:  Recent Labs  Lab 10/17/23 0838  GLUCAP 100*    SpO2: 96 %    Vitals:   10/21/23 1940 10/21/23 1943 10/21/23 2207 10/22/23 9528  BP: (!) 167/61 (!) 172/74 (!) 153/54 (!) 145/69  Pulse: 65 65 63 63  Resp: 18   18  Temp: 98.6 F (37 C)   98.9 F (37.2 C)  TempSrc: Oral   Oral  SpO2: 100%   96%  Weight:      Height:          Data Reviewed:  Basic Metabolic Panel: Recent Labs  Lab 10/16/23 0307 10/17/23 0313 10/20/23 0311 10/21/23 0335 10/22/23 0401  NA 135 137 136 139  --   K 4.3 4.3 4.0 4.1  --   CL 107 106 106 106  --   CO2 18* 17* 20* 21*  --   GLUCOSE 100* 110* 109* 106*  --   BUN 77* 79* 79* 82*  --   CREATININE 5.43* 5.20* 5.09* 5.31* 5.17*  CALCIUM  8.2*  8.1* 7.8* 7.9*  --     CBC: Recent Labs  Lab 10/16/23 0307 10/17/23 0313 10/20/23 0311 10/21/23 0335  WBC 3.0* 3.6* 3.8* 4.3  HGB 10.1* 10.8* 9.2* 8.9*  HCT 33.6* 35.5* 29.8* 28.6*  MCV 98.8 97.5 98.3 97.9  PLT 179 204 146* 144*    LFT Recent Labs  Lab 10/20/23 0311 10/21/23 0335  AST 14* 13*  ALT 6 6  ALKPHOS 32* 32*  BILITOT 0.5 0.5  PROT 4.9* 5.1*  ALBUMIN  2.2* 2.2*     Antibiotics: Anti-infectives (From admission, onward)    Start     Dose/Rate Route Frequency Ordered Stop   10/18/23 1500  acyclovir  (ZOVIRAX ) 300 mg in dextrose  5 % 100 mL IVPB        300 mg 106 mL/hr over 60 Minutes Intravenous Every 24 hours 10/18/23 1412     10/17/23 0800  cefTRIAXone  (ROCEPHIN ) 2 g in sodium chloride  0.9 % 100 mL IVPB        2 g 200 mL/hr over 30 Minutes Intravenous Every 24 hours 10/16/23 0915     10/15/23 0800  ceFEPIme  (MAXIPIME ) 1 g in sodium chloride  0.9 % 100 mL IVPB  Status:  Discontinued        1 g 200 mL/hr over 30 Minutes Intravenous Every 24 hours 10/15/23 0655 10/16/23 0915   10/15/23 0606  vancomycin  variable dose per unstable renal function (pharmacist dosing)  Status:  Discontinued         Does not apply See admin instructions 10/15/23 0606 10/16/23 0915   10/15/23 0600  vancomycin  (VANCOREADY) IVPB 1250 mg/250 mL        1,250 mg 166.7 mL/hr over 90 Minutes Intravenous  Once 10/15/23 0557 10/15/23 1019   10/15/23 0400  cefTRIAXone  (ROCEPHIN ) 2 g in sodium chloride  0.9 % 100 mL IVPB  Status:  Discontinued        2 g 200 mL/hr over 30 Minutes Intravenous Every 24 hours 10/15/23 0347 10/15/23 0638   10/15/23 0400  doxycycline  (VIBRAMYCIN ) 100 mg in sodium chloride  0.9 % 250 mL IVPB  Status:  Discontinued        100 mg 125 mL/hr over 120 Minutes Intravenous Every 12 hours 10/15/23 0348 10/18/23 1345        DVT prophylaxis: Heparin   Code Status: Full code  Family Communication: Discussed with patient's daughter at bedside.   CONSULTS    Subjective    Denies any complaints.   Objective    Physical Examination:  Appears in no acute distress S1-S2, regular, no murmur auscultated Abdomen is soft, nontender Skin, right lesions noted at the neck and right  upper chest.   Status is: Inpatient:             Ozell Blunt   [facility] If 7PM-7AM, please contact night-coverage at [email or web address], Office  [phone]   10/22/2023, 8:00 AM  LOS: 7 days

## 2023-10-23 DIAGNOSIS — N179 Acute kidney failure, unspecified: Secondary | ICD-10-CM | POA: Diagnosis not present

## 2023-10-23 DIAGNOSIS — B028 Zoster with other complications: Secondary | ICD-10-CM | POA: Diagnosis not present

## 2023-10-23 DIAGNOSIS — A419 Sepsis, unspecified organism: Secondary | ICD-10-CM | POA: Diagnosis not present

## 2023-10-23 DIAGNOSIS — R531 Weakness: Secondary | ICD-10-CM | POA: Diagnosis not present

## 2023-10-23 LAB — CULTURE, BLOOD (ROUTINE X 2)
Culture: NO GROWTH
Culture: NO GROWTH
Special Requests: ADEQUATE
Special Requests: ADEQUATE

## 2023-10-23 LAB — GLUCOSE, CAPILLARY: Glucose-Capillary: 108 mg/dL — ABNORMAL HIGH (ref 70–99)

## 2023-10-23 NOTE — TOC Progression Note (Addendum)
 Transition of Care [facility]) - Progression Note    Patient Details  Name: [name] MRN: [id number] Date of Birth: 11-01-1933  [facility] CM/SW Contact  Gertha Ku, LCSW Phone Number: 10/23/2023, 9:06 AM  Clinical Narrative:     Pt's auth pending. TOC to follow.  Adden  10:14 CSW received a message from the pt's insurance company requesting updated PT notes by 3 PM today. CSW sent a message to the PT group to see if someone can see the pt today. TOC to follow.  Barriers to Discharge: Continued Medical Work up  Expected Discharge Plan and Services In-house Referral: NA Discharge Planning Services: CM Consult Post Acute Care Choice: Home Health Living arrangements for the past 2 months: Apartment                 DME Arranged: N/A DME Agency: NA       HH Arranged: PT, OT, Social Work HH Agency: [facility] Date HH Agency Contacted: 10/16/23 Time HH Agency Contacted: 1554 Representative spoke with at [facility] Agency: Brandi with [employer]   Social Determinants of Health (SDOH) Interventions SDOH Screenings   Food Insecurity: Patient Unable To Answer (10/16/2023)  Recent Concern: Food Insecurity - Food Insecurity Present (08/08/2023)  Housing: Patient Unable To Answer (10/16/2023)  Transportation Needs: Patient Unable To Answer (10/16/2023)  Utilities: Patient Unable To Answer (10/16/2023)  Alcohol  Screen: Low Risk  (09/21/2023)  Depression (PHQ2-9): Low Risk  (09/21/2023)  Financial Resource Strain: Low Risk  (09/21/2023)  Physical Activity: Sufficiently Active (09/21/2023)  Social Connections: Patient Unable To Answer (10/16/2023)  Recent Concern: Social Connections - Moderately Isolated (08/08/2023)  Stress: No Stress Concern Present (09/21/2023)  Tobacco Use: Low Risk  (10/14/2023)  Health Literacy: Adequate Health Literacy (09/21/2023)    Readmission Risk Interventions    10/16/2023    3:51 PM 08/08/2023    3:21 PM 11/02/2022   12:32 PM   Readmission Risk Prevention Plan  Transportation Screening Complete  Complete  Medication Review Oceanographer) Complete Complete Complete  PCP or Specialist appointment within 3-5 days of discharge Complete Complete Complete  HRI or Home Care Consult Complete Complete Complete  SW Recovery Care/Counseling Consult Complete  Complete  Palliative Care Screening Not Applicable  Not Applicable  Skilled Nursing Facility Complete Complete Not Applicable

## 2023-10-23 NOTE — Progress Notes (Signed)
 [facility]  PROGRESS NOTE  Annalisse Minkoff ZOX:[id number] DOB: 02-Mar-1934 DOA: 10/14/2023 PCP: Reginal Capra, MD   Brief HPI:   89 y.o. female with history of chronic kidney disease stage V, hypertension, asthma, anemia, CHF recently admitted for respiratory failure secondary to acute CHF discharge about 2 weeks ago was brought to the ER after patient's family noticed patient was getting increasingly confused.  About 2 days ago patient started developing right neck and right upper chest vesicular lesions and yesterday had gone to urgent care and was prescribed Valtrex  for herpes zoster.  Patient has so far taken 5 doses of Valtrex  1 g.  Patient became confused was hallucinating and was talking about things which were not there.     Assessment/Plan:    Sepsis [place] - X-ray showed infiltrates.  - 1/4 bottles growing Streptococcus para sanguinous - broad-spectrum antibiotics cultures ordered.   -Continue ceftriaxone  -ID consulted, TTE negative for vegetation - Patient was started on IV Rocephin . -ID recommends to discontinue Rocephin  and start Keflex to complete 2 weeks of antibiotics, end of treatment 6/18. -Patient to follow-up in ID clinic with Dr. Zelda Hickman on 6/26 for surveillance blood cultures.   Acute encephalopathy/delirium -Improved - Delirium precautions - Suspect due to Valtrex  toxicity - Significantly improved   Left-sided facial droop -Resolved - Code stroke was called, CT head negative for stroke. - Neurology consulted, recommended MRI brain stat - MRI brain was negative   Herpes zoster  -was given Valtrex  at UC and dose not adjusted for renal failure - Hold due to toxicity-based on dosing and kidney function suspect she could remain confused for up to a week - ID consulted; started on acyclovir  -Follow results of VZV and HSV-1 DNA, HSV-2 DNA were not detected -ID has recommended to discontinue acyclovir .  Start Valtrex , renally dosed 500 mg every 24  hours to complete 10 days of total antiviral therapy for empiric VZV meningitis/encephalitis.  End of treatment 6/14. -   Chronic kidney disease stage V  -with metabolic acidosis  - bicarbonate -At baseline   Hypertension  -Blood pressure has been elevated -Continue Coreg , amlodipine  -Start IV hydralazine  10 mg every 4 hours as needed -Catapres  0.1 mg patch q. 7 days   History of CHF  - Torsemide  has been restarted   Anemia  -due to renal disease - Hemoglobin stable   History of asthma  -presently not wheezing.   -Continue home inhalers.   History of anxiety and depression  -on Celexa  and as needed Ativan .    Medications     amLODipine   10 mg Oral QHS   carvedilol   12.5 mg Oral BID   cephALEXin  1,000 mg Oral Q24H   citalopram   10 mg Oral QHS   cloNIDine   0.1 mg Transdermal Weekly   cyanocobalamin   1,000 mcg Oral Daily   feeding supplement (NEPRO CARB STEADY)  237 mL Oral BID BM   ferrous sulfate   325 mg Oral Q breakfast   heparin   5,000 Units Subcutaneous Q8H   lidocaine   1 patch Transdermal Q24H   montelukast   10 mg Oral QHS   sodium bicarbonate   650 mg Oral BID   sodium chloride  flush  3-10 mL Intravenous Q12H   torsemide   40 mg Oral BID   valACYclovir   500 mg Oral Daily     Data Reviewed:   CBG:  Recent Labs  Lab 10/17/23 0838 10/23/23 0757  GLUCAP 100* 108*    SpO2: 97 %    Vitals:  10/22/23 2042 10/22/23 2117 10/23/23 0612 10/23/23 0800  BP: (!) 183/59 (!) 169/57 (!) 149/85 (!) 158/63  Pulse: 68 69 64 66  Resp: 19  17   Temp: 98.6 F (37 C)  98.7 F (37.1 C) 98.6 F (37 C)  TempSrc: Oral  Oral Oral  SpO2: 100%  100% 97%  Weight:      Height:          Data Reviewed:  Basic Metabolic Panel: Recent Labs  Lab 10/17/23 0313 10/20/23 0311 10/21/23 0335 10/22/23 0401  NA 137 136 139  --   K 4.3 4.0 4.1  --   CL 106 106 106  --   CO2 17* 20* 21*  --   GLUCOSE 110* 109* 106*  --   BUN 79* 79* 82*  --   CREATININE 5.20*  5.09* 5.31* 5.17*  CALCIUM  8.1* 7.8* 7.9*  --     CBC: Recent Labs  Lab 10/17/23 0313 10/20/23 0311 10/21/23 0335  WBC 3.6* 3.8* 4.3  HGB 10.8* 9.2* 8.9*  HCT 35.5* 29.8* 28.6*  MCV 97.5 98.3 97.9  PLT 204 146* 144*    LFT Recent Labs  Lab 10/20/23 0311 10/21/23 0335  AST 14* 13*  ALT 6 6  ALKPHOS 32* 32*  BILITOT 0.5 0.5  PROT 4.9* 5.1*  ALBUMIN  2.2* 2.2*     Antibiotics: Anti-infectives (From admission, onward)    Start     Dose/Rate Route Frequency Ordered Stop   10/23/23 0000  valACYclovir  (VALTREX ) 500 MG tablet        500 mg Oral Daily 10/22/23 1136 10/28/23 2359   10/22/23 1300  cephALEXin (KEFLEX) capsule 1,000 mg        1,000 mg Oral Every 24 hours 10/22/23 1002 10/31/23 2359   10/22/23 1100  valACYclovir  (VALTREX ) tablet 500 mg        500 mg Oral Daily 10/22/23 1002 10/27/23 2359   10/22/23 0000  cephALEXin (KEFLEX) 500 MG capsule        1,000 mg Oral Every 24 hours 10/22/23 1136 10/31/23 2359   10/18/23 1500  acyclovir  (ZOVIRAX ) 300 mg in dextrose  5 % 100 mL IVPB  Status:  Discontinued        300 mg 106 mL/hr over 60 Minutes Intravenous Every 24 hours 10/18/23 1412 10/22/23 1002   10/17/23 0800  cefTRIAXone  (ROCEPHIN ) 2 g in sodium chloride  0.9 % 100 mL IVPB  Status:  Discontinued        2 g 200 mL/hr over 30 Minutes Intravenous Every 24 hours 10/16/23 0915 10/22/23 1002   10/15/23 0800  ceFEPIme  (MAXIPIME ) 1 g in sodium chloride  0.9 % 100 mL IVPB  Status:  Discontinued        1 g 200 mL/hr over 30 Minutes Intravenous Every 24 hours 10/15/23 0655 10/16/23 0915   10/15/23 0606  vancomycin  variable dose per unstable renal function (pharmacist dosing)  Status:  Discontinued         Does not apply See admin instructions 10/15/23 0606 10/16/23 0915   10/15/23 0600  vancomycin  (VANCOREADY) IVPB 1250 mg/250 mL        1,250 mg 166.7 mL/hr over 90 Minutes Intravenous  Once 10/15/23 0557 10/15/23 1019   10/15/23 0400  cefTRIAXone  (ROCEPHIN ) 2 g in sodium  chloride 0.9 % 100 mL IVPB  Status:  Discontinued        2 g 200 mL/hr over 30 Minutes Intravenous Every 24 hours 10/15/23 0347 10/15/23 0638   10/15/23 0400  doxycycline  (VIBRAMYCIN ) 100 mg in sodium chloride  0.9 % 250 mL IVPB  Status:  Discontinued        100 mg 125 mL/hr over 120 Minutes Intravenous Every 12 hours 10/15/23 0348 10/18/23 1345        DVT prophylaxis: Heparin   Code Status: Full code  Family Communication: Discussed with patient's daughter at bedside.   CONSULTS    Subjective   Denies any complaints   Objective    Physical Examination:  General-appears in no acute distress Heart-S1-S2, regular, no murmur auscultated Lungs-clear to auscultation bilaterally, no wheezing or crackles auscultated Abdomen-soft, nontender, no organomegaly Extremities-no edema in the lower extremities Neuro-alert, oriented x3, no focal deficit noted Skin-dry lesions noted in right upper chest wall, neck   Status is: Inpatient:             Ozell Blunt   [facility] If 7PM-7AM, please contact night-coverage at [email or web address], Office  [phone]   10/23/2023, 8:44 AM  LOS: 8 days

## 2023-10-23 NOTE — Progress Notes (Signed)
 Physical Therapy Treatment Patient Details Name: [name] MRN: [id number] DOB: 1934/03/14 Today's Date: 10/23/2023   History of Present Illness 89 y.o. female was brought to the ER 10/14/23 after patient's family noticed patient was getting increasingly confused. About 2 days PTA patient started developing right neck and right upper chest vesicular lesions which was getting painful and had gone to her primary care physician was prescribed Valtrex  for herpes zoster. Patient has so far taken 5 doses of Valtrex  1 g. Patient became confused was hallucinating and was talking about things which were not there. Pt with history of chronic kidney disease stage V, hypertension, asthma, anemia, CHF. Pt recently admitted 5/9-5/14/25 for respiratory failure secondary to acute CHF.    PT Comments  Per [facility] team, insurance company has requested a treatment note on today. Family present during session. Pt agreeable to working with therapy (pt has been agreeable and participatory for every PT session). Pt exhibits cognitive deficits--requires repeated multimodal cueing and increased time for processing. Pt exhibits significant global weakness. Pt was barely able to keep bil hands on RW handles-- with intermittent jerking of L UE.  Mobility is currently limited by significant weakness and pain. She is at high risk for falls when mobilizing. At baseline, pt is Mod Ind with mobility/ADLs with use of cane PRN. Pt's current mobility status is a significant change from her baseline. Pt exhibits significant weakness, fatigue/decreased activity tolerance, and impaired gait and balance. Patient will benefit from continued inpatient follow up therapy, <3 hours/day to regain her PLOF and independence. Will also place order for OT consult for eval/tx.    If plan is discharge home, recommend the following: Two people to help with walking and/or transfers;Two people to help with bathing/dressing/bathroom;Assistance with  cooking/housework;Assist for transportation;Help with stairs or ramp for entrance   Can travel by private vehicle     No  Equipment Recommendations  Wheelchair (measurements PT);Wheelchair cushion (measurements PT);BSC/3in1;Rolling walker (2 wheels)    Recommendations for Other Services       Precautions / Restrictions Precautions Precautions: Fall Recall of Precautions/Restrictions: Impaired Restrictions Weight Bearing Restrictions Per Provider Order: No     Mobility  Bed Mobility Overal bed mobility: Needs Assistance Bed Mobility: Supine to Sit, Sit to Supine     Supine to sit: Max assist, HOB elevated, Used rails Sit to supine: Max assist, HOB elevated, Used rails   General bed mobility comments: Cues for safety, technique.  Increased time. Assist for trunk and bil LEs. Utilized bedpad to aid with scooting, repositioning. Uncontrolled descent to bed surface. Weak trunk and bil UEs in addition to painful R UE.    Transfers Overall transfer level: Needs assistance Equipment used: Rolling walker (2 wheels) Transfers: Sit to/from Stand Sit to Stand: Total assist           General transfer comment: 3 attempts to stand at bedside with RW and +1 assist-pt unable due to significant weakness. High fall risk. Pt barely able to keep bil hands on RW handles with intermittent jerking of L UE.  Max-Total A for lateral scooting towards HOB-bedpad utilized to aid with unweighting pt's bottom and for lateral movement. Cues for safety, technique, hand/feet placement.    Ambulation/Gait                   Stairs             Wheelchair Mobility     Tilt Bed    Modified Rankin (Stroke Patients Only)  Balance Overall balance assessment: Needs assistance Sitting-balance support: Feet supported   Sitting balance - Comments: CGA while sitting at EOB ( for at least 10 minutes). Posterior lean with intermittent leaning/propping on R elbow initially. Pt able to  perform 10 reps of LAQs without LOB Postural control: Posterior lean                                  Communication Communication Factors Affecting Communication: Difficulty expressing self  Cognition Arousal: Alert Behavior During Therapy: Flat affect   PT - Cognitive impairments: Problem solving, Sequencing, Initiation                       PT - Cognition Comments: flat, delayed processing; following simple commands, decreased initiation-requires cues Following commands: Impaired Following commands impaired: Follows one step commands with increased time    Cueing Cueing Techniques: Verbal cues, Tactile cues, Gestural cues  Exercises General Exercises - Lower Extremity Long Arc Quad: AROM, Both, 10 reps, Seated    General Comments        Pertinent Vitals/Pain Pain Assessment Pain Assessment: Faces Faces Pain Scale: Hurts even more Pain Location: R side of neck, shoulder, when moving R UE. bil feet, R LE Pain Descriptors / Indicators: Grimacing, Guarding Pain Intervention(s): Limited activity within patient's tolerance, Monitored during session, Repositioned    Home Living                          Prior Function            PT Goals (current goals can now be found in the care plan section) Progress towards PT goals: Progressing toward goals    Frequency    Min 2X/week      PT Plan      Co-evaluation              AM-PAC PT "6 Clicks" Mobility   Outcome Measure  Help needed turning from your back to your side while in a flat bed without using bedrails?: A Lot Help needed moving from lying on your back to sitting on the side of a flat bed without using bedrails?: Total Help needed moving to and from a bed to a chair (including a wheelchair)?: Total Help needed standing up from a chair using your arms (e.g., wheelchair or bedside chair)?: Total Help needed to walk in hospital room?: Total Help needed climbing 3-5 steps  with a railing? : Total 6 Click Score: 7    End of Session Equipment Utilized During Treatment: Gait belt Activity Tolerance: Patient limited by fatigue;Patient limited by pain Patient left: in bed;with call bell/phone within reach;with bed alarm set;with family/visitor present   PT Visit Diagnosis: Muscle weakness (generalized) (M62.81);Other abnormalities of gait and mobility (R26.89);Pain Pain - part of body: Shoulder;Leg;Ankle and joints of foot;Arm     Time: 4098-1191 PT Time Calculation (min) (ACUTE ONLY): 30 min  Charges:    $Therapeutic Activity: 23-37 mins PT General Charges $$ ACUTE PT VISIT: 1 Visit                         Tanda Falter, PT Acute Rehabilitation  Office: [phone]

## 2023-10-24 ENCOUNTER — Encounter: Payer: Self-pay | Admitting: Hematology

## 2023-10-24 ENCOUNTER — Other Ambulatory Visit (HOSPITAL_COMMUNITY): Payer: Self-pay

## 2023-10-24 DIAGNOSIS — G9340 Encephalopathy, unspecified: Secondary | ICD-10-CM | POA: Diagnosis not present

## 2023-10-24 DIAGNOSIS — F411 Generalized anxiety disorder: Secondary | ICD-10-CM

## 2023-10-24 DIAGNOSIS — Z7409 Other reduced mobility: Secondary | ICD-10-CM | POA: Diagnosis not present

## 2023-10-24 DIAGNOSIS — J45909 Unspecified asthma, uncomplicated: Secondary | ICD-10-CM | POA: Diagnosis not present

## 2023-10-24 DIAGNOSIS — R2689 Other abnormalities of gait and mobility: Secondary | ICD-10-CM | POA: Diagnosis not present

## 2023-10-24 DIAGNOSIS — R6520 Severe sepsis without septic shock: Secondary | ICD-10-CM | POA: Diagnosis not present

## 2023-10-24 DIAGNOSIS — E782 Mixed hyperlipidemia: Secondary | ICD-10-CM | POA: Diagnosis not present

## 2023-10-24 DIAGNOSIS — I2489 Other forms of acute ischemic heart disease: Secondary | ICD-10-CM | POA: Diagnosis not present

## 2023-10-24 DIAGNOSIS — Z741 Need for assistance with personal care: Secondary | ICD-10-CM | POA: Diagnosis not present

## 2023-10-24 DIAGNOSIS — N185 Chronic kidney disease, stage 5: Secondary | ICD-10-CM | POA: Diagnosis not present

## 2023-10-24 DIAGNOSIS — N189 Chronic kidney disease, unspecified: Secondary | ICD-10-CM | POA: Diagnosis not present

## 2023-10-24 DIAGNOSIS — I160 Hypertensive urgency: Secondary | ICD-10-CM | POA: Diagnosis not present

## 2023-10-24 DIAGNOSIS — G9349 Other encephalopathy: Secondary | ICD-10-CM | POA: Diagnosis not present

## 2023-10-24 DIAGNOSIS — M6281 Muscle weakness (generalized): Secondary | ICD-10-CM | POA: Diagnosis not present

## 2023-10-24 DIAGNOSIS — A879 Viral meningitis, unspecified: Secondary | ICD-10-CM | POA: Diagnosis not present

## 2023-10-24 DIAGNOSIS — D638 Anemia in other chronic diseases classified elsewhere: Secondary | ICD-10-CM

## 2023-10-24 DIAGNOSIS — I110 Hypertensive heart disease with heart failure: Secondary | ICD-10-CM | POA: Diagnosis not present

## 2023-10-24 DIAGNOSIS — J4540 Moderate persistent asthma, uncomplicated: Secondary | ICD-10-CM | POA: Diagnosis not present

## 2023-10-24 DIAGNOSIS — E872 Acidosis, unspecified: Secondary | ICD-10-CM | POA: Diagnosis not present

## 2023-10-24 DIAGNOSIS — B028 Zoster with other complications: Secondary | ICD-10-CM | POA: Diagnosis not present

## 2023-10-24 DIAGNOSIS — Z743 Need for continuous supervision: Secondary | ICD-10-CM | POA: Diagnosis not present

## 2023-10-24 DIAGNOSIS — R7881 Bacteremia: Secondary | ICD-10-CM | POA: Diagnosis not present

## 2023-10-24 DIAGNOSIS — R531 Weakness: Secondary | ICD-10-CM | POA: Diagnosis not present

## 2023-10-24 DIAGNOSIS — J9600 Acute respiratory failure, unspecified whether with hypoxia or hypercapnia: Secondary | ICD-10-CM | POA: Diagnosis not present

## 2023-10-24 DIAGNOSIS — E441 Mild protein-calorie malnutrition: Secondary | ICD-10-CM | POA: Diagnosis not present

## 2023-10-24 DIAGNOSIS — I272 Pulmonary hypertension, unspecified: Secondary | ICD-10-CM | POA: Diagnosis not present

## 2023-10-24 DIAGNOSIS — D631 Anemia in chronic kidney disease: Secondary | ICD-10-CM | POA: Diagnosis not present

## 2023-10-24 DIAGNOSIS — A409 Streptococcal sepsis, unspecified: Secondary | ICD-10-CM | POA: Diagnosis not present

## 2023-10-24 DIAGNOSIS — B029 Zoster without complications: Secondary | ICD-10-CM | POA: Diagnosis not present

## 2023-10-24 DIAGNOSIS — N179 Acute kidney failure, unspecified: Secondary | ICD-10-CM | POA: Diagnosis not present

## 2023-10-24 DIAGNOSIS — M6259 Muscle wasting and atrophy, not elsewhere classified, multiple sites: Secondary | ICD-10-CM | POA: Diagnosis not present

## 2023-10-24 DIAGNOSIS — Z7401 Bed confinement status: Secondary | ICD-10-CM | POA: Diagnosis not present

## 2023-10-24 DIAGNOSIS — A419 Sepsis, unspecified organism: Secondary | ICD-10-CM | POA: Diagnosis not present

## 2023-10-24 DIAGNOSIS — E1122 Type 2 diabetes mellitus with diabetic chronic kidney disease: Secondary | ICD-10-CM | POA: Diagnosis not present

## 2023-10-24 DIAGNOSIS — I1311 Hypertensive heart and chronic kidney disease without heart failure, with stage 5 chronic kidney disease, or end stage renal disease: Secondary | ICD-10-CM | POA: Diagnosis not present

## 2023-10-24 DIAGNOSIS — K219 Gastro-esophageal reflux disease without esophagitis: Secondary | ICD-10-CM | POA: Diagnosis not present

## 2023-10-24 DIAGNOSIS — I5043 Acute on chronic combined systolic (congestive) and diastolic (congestive) heart failure: Secondary | ICD-10-CM | POA: Diagnosis not present

## 2023-10-24 DIAGNOSIS — I509 Heart failure, unspecified: Secondary | ICD-10-CM | POA: Diagnosis not present

## 2023-10-24 DIAGNOSIS — J9601 Acute respiratory failure with hypoxia: Secondary | ICD-10-CM | POA: Diagnosis not present

## 2023-10-24 DIAGNOSIS — I132 Hypertensive heart and chronic kidney disease with heart failure and with stage 5 chronic kidney disease, or end stage renal disease: Secondary | ICD-10-CM | POA: Diagnosis not present

## 2023-10-24 LAB — MAGNESIUM: Magnesium: 1.8 mg/dL (ref 1.7–2.4)

## 2023-10-24 LAB — CBC WITH DIFFERENTIAL/PLATELET
Abs Immature Granulocytes: 0.07 10*3/uL (ref 0.00–0.07)
Basophils Absolute: 0 10*3/uL (ref 0.0–0.1)
Basophils Relative: 0 %
Eosinophils Absolute: 0.1 10*3/uL (ref 0.0–0.5)
Eosinophils Relative: 2 %
HCT: 27 % — ABNORMAL LOW (ref 36.0–46.0)
Hemoglobin: 8.5 g/dL — ABNORMAL LOW (ref 12.0–15.0)
Immature Granulocytes: 2 %
Lymphocytes Relative: 22 %
Lymphs Abs: 1 10*3/uL (ref 0.7–4.0)
MCH: 30 pg (ref 26.0–34.0)
MCHC: 31.5 g/dL (ref 30.0–36.0)
MCV: 95.4 fL (ref 80.0–100.0)
Monocytes Absolute: 0.4 10*3/uL (ref 0.1–1.0)
Monocytes Relative: 9 %
Neutro Abs: 3.1 10*3/uL (ref 1.7–7.7)
Neutrophils Relative %: 65 %
Platelets: 198 10*3/uL (ref 150–400)
RBC: 2.83 MIL/uL — ABNORMAL LOW (ref 3.87–5.11)
RDW: 14.3 % (ref 11.5–15.5)
WBC: 4.7 10*3/uL (ref 4.0–10.5)
nRBC: 0 % (ref 0.0–0.2)

## 2023-10-24 LAB — COMPREHENSIVE METABOLIC PANEL WITH GFR
ALT: 7 U/L (ref 0–44)
AST: 14 U/L — ABNORMAL LOW (ref 15–41)
Albumin: 1.9 g/dL — ABNORMAL LOW (ref 3.5–5.0)
Alkaline Phosphatase: 38 U/L (ref 38–126)
Anion gap: 12 (ref 5–15)
BUN: 75 mg/dL — ABNORMAL HIGH (ref 8–23)
CO2: 24 mmol/L (ref 22–32)
Calcium: 7.8 mg/dL — ABNORMAL LOW (ref 8.9–10.3)
Chloride: 99 mmol/L (ref 98–111)
Creatinine, Ser: 5.46 mg/dL — ABNORMAL HIGH (ref 0.44–1.00)
GFR, Estimated: 7 mL/min — ABNORMAL LOW (ref >60)
Glucose, Bld: 107 mg/dL — ABNORMAL HIGH (ref 70–99)
Potassium: 3.8 mmol/L (ref 3.5–5.1)
Sodium: 135 mmol/L (ref 135–145)
Total Bilirubin: 0.7 mg/dL (ref 0.0–1.2)
Total Protein: 5.4 g/dL — ABNORMAL LOW (ref 6.5–8.1)

## 2023-10-24 LAB — PHOSPHORUS: Phosphorus: 6 mg/dL — ABNORMAL HIGH (ref 2.5–4.6)

## 2023-10-24 MED ORDER — LORAZEPAM 0.5 MG PO TABS: 0.5000 mg | ORAL_TABLET | Freq: Two times a day (BID) | ORAL | 0 refills | Status: DC | PRN

## 2023-10-24 MED ORDER — GENTEAL TEARS 0.1-0.2-0.3 % OP SOLN
2.0000 [drp] | OPHTHALMIC | 0 refills | Status: AC | PRN
Start: 2023-10-24 — End: ?
  Filled 2023-10-24: qty 15, 30d supply, fill #0

## 2023-10-24 MED ORDER — NEPRO/CARBSTEADY PO LIQD
237.0000 mL | Freq: Two times a day (BID) | ORAL | 0 refills | Status: AC
Start: 2023-10-24 — End: ?
  Filled 2023-10-24: qty 2370, 5d supply, fill #0

## 2023-10-24 NOTE — TOC Transition Note (Signed)
 Transition of Care [facility]) - Discharge Note   Patient Details  Name: [name] MRN: [id number] Date of Birth: 1933/11/19  Transition of Care [facility]) CM/SW Contact:  Gertha Ku, LCSW Phone Number: 10/24/2023, 2:16 PM   Clinical Narrative:     Pt to d/c to [facility] for short term rehab. CSW spoke with pt's Granddaughter who agrees with d/c plan. Pt's room is 706, RN to call report to [phone]. PTAR called no further TOC needs , TOC sign off.        Patient Goals and CMS Choice Patient states their goals for this hospitalization and ongoing recovery are:: To return home CMS Medicare.gov Compare Post Acute Care list provided to:: Other (Comment Required) (NA) Choice offered to / list presented to : NA [place] ownership interest in [facility].provided to:: Parent NA    Discharge Placement                       Discharge Plan and Services Additional resources added to the After Visit Summary for   In-house Referral: NA Discharge Planning Services: CM Consult Post Acute Care Choice: Home Health          DME Arranged: N/A DME Agency: NA       HH Arranged: PT, OT, Social Work [employer] Agency: [facility] Date HH Agency Contacted: 10/16/23 Time HH Agency Contacted: 1554 Representative spoke with at [facility] Agency: Brandi with [employer]  Social Drivers of Health (SDOH) Interventions SDOH Screenings   Food Insecurity: Patient Unable To Answer (10/16/2023)  Recent Concern: Food Insecurity - Food Insecurity Present (08/08/2023)  Housing: Patient Unable To Answer (10/16/2023)  Transportation Needs: Patient Unable To Answer (10/16/2023)  Utilities: Patient Unable To Answer (10/16/2023)  Alcohol  Screen: Low Risk  (09/21/2023)  Depression (PHQ2-9): Low Risk  (09/21/2023)  Financial Resource Strain: Low Risk  (09/21/2023)  Physical Activity: Sufficiently Active (09/21/2023)  Social Connections: Patient Unable To Answer (10/16/2023)  Recent Concern:  Social Connections - Moderately Isolated (08/08/2023)  Stress: No Stress Concern Present (09/21/2023)  Tobacco Use: Low Risk  (10/14/2023)  Health Literacy: Adequate Health Literacy (09/21/2023)     Readmission Risk Interventions    10/16/2023    3:51 PM 08/08/2023    3:21 PM 11/02/2022   12:32 PM  Readmission Risk Prevention Plan  Transportation Screening Complete  Complete  Medication Review Oceanographer) Complete Complete Complete  PCP or Specialist appointment within 3-5 days of discharge Complete Complete Complete  HRI or Home Care Consult Complete Complete Complete  SW Recovery Care/Counseling Consult Complete  Complete  Palliative Care Screening Not Applicable  Not Applicable  Skilled Nursing Facility Complete Complete Not Applicable

## 2023-10-24 NOTE — Evaluation (Signed)
 Occupational Therapy Evaluation Patient Details Name: [name] MRN: [id number] DOB: Sep 03, 1933 Today's Date: 10/24/2023   History of Present Illness   89 yr old female who was brought to the ER 10/14/23 after patient's family noticed patient was getting increasingly confused. About 2 days prior the patient started developing right neck and right upper chest vesicular lesions which was getting painful and had gone to her primary care physician was prescribed Valtrex  for herpes zoster. Patient became confused, was hallucinating, and was talking about things which were not there. Pt with history of chronic kidney disease stage V, hypertension, asthma, anemia, CHF. Pt recently admitted 5/9-5/14/25 for respiratory failure secondary to acute CHF.     Clinical Impressions The pt is currently presenting significantly below her baseline level of functioning for self-care management, as she is limited by the below listed deficits (see OT problem list). During the session, she was also noted to be with pronounced generalized weakness, deconditioning, flat affect, delayed initiation of tasks, and slight lethargy. She required max assist x2 for supine to sit and to stand twice from the EOB using a RW. During each stand attempt, she was unable to achieve full upright standing, due to weakness. As such, she was unable to progress to safe out of bed ADL participation. Without further OT services, she is at high risk for further weakness and deconditioning, as well as progressive functional decline. Patient will benefit from continued inpatient follow up therapy, <3 hours/day.      If plan is discharge home, recommend the following:   A lot of help with bathing/dressing/bathroom;Assistance with cooking/housework;Direct supervision/assist for medications management;Assist for transportation;Help with stairs or ramp for entrance;Two people to help with walking and/or transfers;Assistance with feeding;Direct  supervision/assist for financial management     Functional Status Assessment   Patient has had a recent decline in their functional status and demonstrates the ability to make significant improvements in function in a reasonable and predictable amount of time.     Equipment Recommendations   Other (comment) (defer to next level of care)     Recommendations for Other Services         Precautions/Restrictions   Precautions Precautions: Fall Restrictions Weight Bearing Restrictions Per Provider Order: No Other Position/Activity Restrictions: airborne precautions     Mobility Bed Mobility Overal bed mobility: Needs Assistance Bed Mobility: Supine to Sit, Sit to Supine, Rolling Rolling: Max assist   Supine to sit: Max assist, HOB elevated, Used rails Sit to supine: Max assist, HOB elevated, Used rails   General bed mobility comments: Cues for safety, technique.  Increased time. Assist for trunk and bil LEs. Utilized bedpad to aid with scooting, repositioning. Uncontrolled descent to bed surface. Weak trunk and bil UEs in addition to painful R UE.    Transfers Overall transfer level: Needs assistance Equipment used: Rolling walker (2 wheels) Transfers: Sit to/from Stand Sit to Stand: +2 physical assistance, From elevated surface, Max assist           General transfer comment: Pt required max cues for hand placement, B LE placement on the floor, pusing with B LE, trunk extension in standing, and bilateral knee extension. Pt performed 2 stands, however was unable to achieve full upright standing each time, due to weakness      Balance     Sitting balance-Leahy Scale: Poor Sitting balance - Comments:  (Intermittent lateral leaning and arm propping on the bed)     Standing balance-Leahy Scale: Poor  ADL either performed or assessed with clinical judgement   ADL Overall ADL's : Needs assistance/impaired Eating/Feeding: Moderate assistance;Bed  level Eating/Feeding Details (indicate cue type and reason): Pt anticipated to be limited generalized weakness, deconditioning, and decreased alertness Grooming: Maximal assistance;Bed level           Upper Body Dressing : Maximal assistance;Bed level   Lower Body Dressing: Total assistance;Bed level       Toileting- Clothing Manipulation and Hygiene: Total assistance;Bed level                Pertinent Vitals/Pain Pain Assessment Pain Assessment: Faces Pain Score: 5  Pain Location: L leg to touch and with movement Pain Descriptors / Indicators: Grimacing, Guarding Pain Intervention(s): Limited activity within patient's tolerance, Monitored during session, Repositioned     Extremity/Trunk Assessment Upper Extremity Assessment Upper Extremity Assessment: Generalized weakness   Lower Extremity Assessment Lower Extremity Assessment: Generalized weakness       Communication Communication Factors Affecting Communication: Difficulty expressing self   Cognition Arousal: Lethargic Behavior During Therapy: Flat affect          Following commands: Impaired Following commands impaired: Follows one step commands with increased time (and repetition of prompts)     Cueing  General Comments   Cueing Techniques: Verbal cues;Tactile cues;Gestural cues              Home Living Family/patient expects to be discharged to:: Private residence Living Arrangements: Other relatives (granddaughter) Available Help at Discharge: Family;Available PRN/intermittently Type of Home: Apartment Home Access: Level entry     Home Layout: One level     Bathroom Shower/Tub: Tub/shower unit         Home Equipment: Cane - single point;Shower seat          Prior Functioning/Environment Prior Level of Function : Independent/Modified Independent;Driving             Mobility Comments: ModI  for mobility using the SPC. Denies falls in the last 67mo. ADLs Comments: Ind  with ADLs and light meal prep. Pt can drive, but doesn't as much anymore and will not drive at night. Typically relies on her family for transportation. Granddaughter manages medications    OT Problem List: Decreased strength;Impaired balance (sitting and/or standing);Pain;Decreased activity tolerance;Decreased knowledge of use of DME or AE   OT Treatment/Interventions: Self-care/ADL training;Therapeutic exercise;Patient/family education;Therapeutic activities;DME and/or AE instruction;Energy conservation;Balance training;Cognitive remediation/compensation      OT Goals(Current goals can be found in the care plan section)   Acute Rehab OT Goals Patient Stated Goal: to get better OT Goal Formulation: With patient/family Time For Goal Achievement: 11/07/23 Potential to Achieve Goals: Good ADL Goals Pt Will Perform Eating: with set-up;with supervision;sitting Pt Will Perform Grooming: with set-up;with supervision;sitting Pt Will Perform Upper Body Dressing: with supervision;with set-up;sitting Additional ADL Goal #1: Pt will perform bed mobility and sit to stand with CGA, in prep for progressive ADL participation.   OT Frequency:  Min 2X/week       AM-PAC OT 6 Clicks Daily Activity     Outcome Measure Help from another person eating meals?: A Lot Help from another person taking care of personal grooming?: A Lot Help from another person toileting, which includes using toliet, bedpan, or urinal?: Total Help from another person bathing (including washing, rinsing, drying)?: A Lot Help from another person to put on and taking off regular upper body clothing?: A Lot Help from another person to put on and taking off regular lower body clothing?: Total  6 Click Score: 10   End of Session Equipment Utilized During Treatment: Rolling walker (2 wheels) Nurse Communication: Mobility status  Activity Tolerance: Patient limited by fatigue;Patient limited by lethargy;Patient limited by  pain Patient left: in bed;with bed alarm set;with family/visitor present;with call bell/phone within reach  OT Visit Diagnosis: Unsteadiness on feet (R26.81);Other abnormalities of gait and mobility (R26.89);Muscle weakness (generalized) (M62.81)                Time: 4098-1191 OT Time Calculation (min): 28 min Charges:  OT General Charges $OT Visit: 1 Visit OT Evaluation $OT Eval Moderate Complexity: 1 Mod OT Treatments $Therapeutic Activity: 8-22 mins    Sheralyn Dies, OTR/L 10/24/2023, 3:13 PM

## 2023-10-24 NOTE — Hospital Course (Signed)
 The patient is an 89 y.o. female with history of chronic kidney disease stage V, hypertension, asthma, anemia, CHF recently admitted for respiratory failure secondary to acute CHF discharge about 2 weeks ago was brought to the ER after patient's family noticed patient was getting increasingly confused.  About 2 days ago patient started developing right neck and right upper chest vesicular lesions and yesterday had gone to urgent care and was prescribed Valtrex  for herpes zoster.  Patient has so far taken 5 doses of Valtrex  1 g.  Patient became confused was hallucinating and was talking about things which were not there.  She was admitted for sepsis and bacteremia and found to have a Streptococcus para sanguinous bacteremia.  ID was consulted and she underwent TTE which was negative for vegetation and they recommended discontinuing IV ceftriaxone  and starting Keflex.  Her encephalopathy and delirium improved and was suspected due to Valtrex  toxicity.  During hospitalization she had a left-sided facial droop which was concerning for a code stroke but neurology was consulted and resolved and MRI brain was negative.  For her herpes zoster ID recommends discontinuing acyclovir  starting Valtrex  renally dosed 500 mg every 24 hours to complete 10 days.  At this time she is medically stable for discharge and she will need to follow-up with PCP and infectious disease in outpatient setting.  Assessment and Plan:  Sepsis in the setting of Streptococcus Parasanguinis Bacteremia:  X-ray showed infiltrates. 1/4 bottles growing Streptococcus para sanguinous. Broad-spectrum antibiotics cultures ordered and was placed on IV Ceftriaxone . ID consulted, TTE negative for vegetation. -ID recommends to discontinue Rocephin  and start Keflex to complete 2 weeks of antibiotics, end of treatment 6/18. Patient to follow-up in ID clinic with Dr. Zelda Hickman on 6/26 for surveillance blood cultures.   Acute Encephalopathy due to Valtrex   Toxicity/Delirium, Improved: Delirium precautions. Suspect due to Valtrex  toxicity, Significantly improved and close to her baseline    Left-sided facial droop; Resolved:  Code stroke was called, CT head negative for stroke. Neurology consulted, recommended MRI brain stat and MRI brain was negative   Herpes Zoster: Was given Valtrex  at [facility] and dose not adjusted for renal failure.  Hold due to toxicity-based on dosing and kidney function suspect she could remain confused for up to a week.  ID consulted; started on acyclovir  and Follow results of VZV and HSV-1 DNA, HSV-2 DNA were not detected. -ID has recommended to discontinue acyclovir .  Start Valtrex , renally dosed 500 mg every 24 hours to complete 10 days of total antiviral therapy for empiric VZV meningitis/encephalitis.  End of treatment 6/14.   Chronic Kidney Disease Stage V w/ Metabolic Acidosis: BUN/Cr Trend: Recent Labs  Lab 10/14/23 1720 10/14/23 2309 10/15/23 0232 10/16/23 0307 10/17/23 0313 10/20/23 0311 10/21/23 0335 10/22/23 0401 10/24/23 0920  BUN 80*  --  80* 77* 79* 79* 82*  --  75*  CREATININE 5.97*   < > 5.64* 5.43* 5.20* 5.09* 5.31* 5.17* 5.46*   < > = values in this interval not displayed.  -Avoid Nephrotoxic Medications, Contrast Dyes, Hypotension and Dehydration to Ensure Adequate Renal Perfusion and will need to Renally Adjust Meds. CTM and Trend Renal Function carefully and repeat CMP w/in 1 week   Essential Hypertension: Blood pressure has been elevated. Continue Coreg , amlodipine  -Start IV hydralazine  10 mg every 4 hours as needed; C/w Catapres  0.1 mg patch q. 7 days. CTM BP per Protocol. Last BP reading was 134/54   History of CHF:  Torsemide  has been restarted; CTM monitor for  S/Sx of Volume overload.   Normocytic Anemia / Anemia of Chronic Renal Disease: Due to renal disease. Hgb/Hct Trend relatively stable:  Recent Labs  Lab 10/14/23 1720 10/15/23 0232 10/16/23 0307 10/17/23 0313 10/20/23 0311  10/21/23 0335 10/24/23 0920  HGB 9.9* 9.4* 10.1* 10.8* 9.2* 8.9* 8.5*  HCT 32.8* 31.1* 33.6* 35.5* 29.8* 28.6* 27.0*  MCV 98.2 98.1 98.8 97.5 98.3 97.9 95.4  -Check Anemia Panel in the outpatient. CTM for S/Sx of Bleeding; No overt bleeding noted. Repeat CBC in the AM   History of asthma: Presently not wheezing.  Continue home inhalers.   History of Anxiety and Depression: C/w Celexa  and as needed Ativan .  Hypoalbuminemia: Patient's Albumin  Trending down and went from 2.9 -> 1.9. CTM and Trend and repeat CMP in the AM

## 2023-10-24 NOTE — Discharge Summary (Signed)
 Physician Discharge Summary   Patient: [name] MRN: [id number] DOB: 01-Feb-1934  Admit date:     10/14/2023  Discharge date: 10/24/23  Discharge Physician: Aura Leeds, DO   PCP: Reginal Capra, MD   Recommendations at discharge:   Follow-up with PCP within 1 to 2 weeks and repeat CBC, CMP, mag, Phos within 1 week With Infectious Disease clinic on 11/08/2023 with Dr. Orlie Bjornstad  Discharge Diagnoses: Principal Problem:   Sepsis [facility]) Active Problems:   Acute kidney injury superimposed on stage 5 chronic kidney disease, not on chronic dialysis (HCC)   Anemia of chronic disease   Hyperlipidemia   Anxiety state   HTN (hypertension)   Anemia, chronic disease   Asthma, chronic   Acute encephalopathy   Hypothermia  Resolved Problems:   * No resolved hospital problems. [facility] Course: The patient is an 89 y.o. female with history of chronic kidney disease stage V, hypertension, asthma, anemia, CHF recently admitted for respiratory failure secondary to acute CHF discharge about 2 weeks ago was brought to the ER after patient's family noticed patient was getting increasingly confused.  About 2 days ago patient started developing right neck and right upper chest vesicular lesions and yesterday had gone to urgent care and was prescribed Valtrex  for herpes zoster.  Patient has so far taken 5 doses of Valtrex  1 g.  Patient became confused was hallucinating and was talking about things which were not there.  She was admitted for sepsis and bacteremia and found to have a Streptococcus para sanguinous bacteremia.  ID was consulted and she underwent TTE which was negative for vegetation and they recommended discontinuing IV ceftriaxone  and starting Keflex.  Her encephalopathy and delirium improved and was suspected due to Valtrex  toxicity.  During hospitalization she had a left-sided facial droop which was concerning for a code stroke but neurology was consulted and resolved and MRI  brain was negative.  For her herpes zoster ID recommends discontinuing acyclovir  starting Valtrex  renally dosed 500 mg every 24 hours to complete 10 days.  At this time she is medically stable for discharge and she will need to follow-up with PCP and infectious disease in outpatient setting.  Assessment and Plan:  Sepsis in the setting of Streptococcus Parasanguinis Bacteremia:  X-ray showed infiltrates. 1/4 bottles growing Streptococcus para sanguinous. Broad-spectrum antibiotics cultures ordered and was placed on IV Ceftriaxone . ID consulted, TTE negative for vegetation. -ID recommends to discontinue Rocephin  and start Keflex to complete 2 weeks of antibiotics, end of treatment 6/18. Patient to follow-up in ID clinic with Dr. Zelda Hickman on 6/26 for surveillance blood cultures.   Acute Encephalopathy due to Valtrex  Toxicity/Delirium, Improved: Delirium precautions. Suspect due to Valtrex  toxicity, Significantly improved and close to her baseline    Left-sided facial droop; Resolved:  Code stroke was called, CT head negative for stroke. Neurology consulted, recommended MRI brain stat and MRI brain was negative   Herpes Zoster: Was given Valtrex  at UC and dose not adjusted for renal failure.  Hold due to toxicity-based on dosing and kidney function suspect she could remain confused for up to a week.  ID consulted; started on acyclovir  and Follow results of VZV and HSV-1 DNA, HSV-2 DNA were not detected. -ID has recommended to discontinue acyclovir .  Start Valtrex , renally dosed 500 mg every 24 hours to complete 10 days of total antiviral therapy for empiric VZV meningitis/encephalitis.  End of treatment 6/14.   Chronic Kidney Disease Stage V w/ Metabolic Acidosis: BUN/Cr Trend:  Recent Labs  Lab 10/14/23 1720 10/14/23 2309 10/15/23 0232 10/16/23 0307 10/17/23 0313 10/20/23 0311 10/21/23 0335 10/22/23 0401 10/24/23 0920  BUN 80*  --  80* 77* 79* 79* 82*  --  75*  CREATININE 5.97*   < > 5.64* 5.43*  5.20* 5.09* 5.31* 5.17* 5.46*   < > = values in this interval not displayed.  -Avoid Nephrotoxic Medications, Contrast Dyes, Hypotension and Dehydration to Ensure Adequate Renal Perfusion and will need to Renally Adjust Meds. CTM and Trend Renal Function carefully and repeat CMP w/in 1 week   Essential Hypertension: Blood pressure has been elevated. Continue Coreg , amlodipine  -Start IV hydralazine  10 mg every 4 hours as needed; C/w Catapres  0.1 mg patch q. 7 days. CTM BP per Protocol. Last BP reading was 134/54   History of CHF:  Torsemide  has been restarted; CTM monitor for S/Sx of Volume overload.   Normocytic Anemia / Anemia of Chronic Renal Disease: Due to renal disease. Hgb/Hct Trend relatively stable:  Recent Labs  Lab 10/14/23 1720 10/15/23 0232 10/16/23 0307 10/17/23 0313 10/20/23 0311 10/21/23 0335 10/24/23 0920  HGB 9.9* 9.4* 10.1* 10.8* 9.2* 8.9* 8.5*  HCT 32.8* 31.1* 33.6* 35.5* 29.8* 28.6* 27.0*  MCV 98.2 98.1 98.8 97.5 98.3 97.9 95.4  -Check Anemia Panel in the outpatient. CTM for S/Sx of Bleeding; No overt bleeding noted. Repeat CBC in the AM   History of asthma: Presently not wheezing.  Continue home inhalers.   History of Anxiety and Depression: C/w Celexa  and as needed Ativan .  Hypoalbuminemia: Patient's Albumin  Trending down and went from 2.9 -> 1.9. CTM and Trend and repeat CMP in the AM   Consultants: Neurology, ID Procedures performed: As delineated as above   Disposition: Skilled nursing facility  Diet recommendation:  Renal diet - Dysphagia 3 Diet  DISCHARGE MEDICATION: Allergies as of 10/24/2023       Reactions   Penicillins Hives   Asa [aspirin] Other (See Comments)   Wheezing Acetaminophen  is OK   Fish Allergy Itching, Nausea And Vomiting, Swelling   Swelling of the face        Medication List     TAKE these medications    acetaminophen  325 MG tablet Commonly known as: TYLENOL  Take 2 tablets (650 mg total) by mouth every 6 (six)  hours as needed for mild pain (pain score 1-3) (or Fever >/= 101).   AeroChamber Plus inhaler Use as instructed   albuterol  (2.5 MG/3ML) 0.083% nebulizer solution Commonly known as: PROVENTIL  Take 3 mLs (2.5 mg total) by nebulization every 6 (six) hours as needed for shortness of breath or wheezing.   albuterol  108 (90 Base) MCG/ACT inhaler Commonly known as: VENTOLIN  HFA USE 2 PUFFS EVERY 6 HOURS AS NEEDED FOR WHEEZING.   amLODipine  10 MG tablet Commonly known as: NORVASC  Take 1 tablet (10 mg total) by mouth daily. What changed: when to take this   artificial tears ophthalmic solution Place 2 drops into both eyes as needed for dry eyes.   Breztri  Aerosphere 160-9-4.8 MCG/ACT Aero inhaler Generic drug: budesonide -glycopyrrolate -formoterol  INHALE 2 PUFFS INTO THE LUNGS TWICE DAILY, IN THE MORNING & AT BEDTIME. What changed:  how much to take how to take this when to take this reasons to take this additional instructions   carvedilol  12.5 MG tablet Commonly known as: COREG  Take 1 tablet (12.5 mg total) by mouth 2 (two) times daily.   cephALEXin 500 MG capsule Commonly known as: KEFLEX Take 2 capsules (1,000 mg total) by mouth daily  for 9 days.   citalopram  20 MG tablet Commonly known as: CELEXA  Take 0.5 tablets (10 mg total) by mouth daily. What changed: when to take this   cloNIDine  0.1 MG tablet Commonly known as: CATAPRES  TAKE ONE TABLET BY MOUTH TWICE DAILY AS NEEDED for hypertension What changed:  how much to take how to take this when to take this reasons to take this additional instructions   cyanocobalamin  1000 MCG tablet Commonly known as: VITAMIN B12 Take 1,000 mcg by mouth daily.   feeding supplement (NEPRO CARB STEADY) Liqd Take 237 mLs by mouth 2 (two) times daily between meals.   ferrous sulfate  325 (65 FE) MG EC tablet Take 1 tablet (325 mg total) by mouth daily with breakfast.   fluticasone  50 MCG/ACT nasal spray Commonly known as:  FLONASE  Place 2 sprays into both nostrils as needed for allergies or rhinitis.   hydrALAZINE  100 MG tablet Commonly known as: APRESOLINE  Take 1 tablet (100 mg total) by mouth 3 (three) times daily.   lidocaine  5 % Commonly known as: LIDODERM  Place 1 patch onto the skin daily. Remove & Discard patch within 12 hours or as directed by MD   LORazepam  0.5 MG tablet Commonly known as: ATIVAN  Take 1 tablet (0.5 mg total) by mouth 2 (two) times daily as needed. for anxiety   montelukast  10 MG tablet Commonly known as: SINGULAIR  Take 1 tablet (10 mg total) by mouth daily. What changed: when to take this   sodium bicarbonate  650 MG tablet Take 1 tablet (650 mg total) by mouth 2 (two) times daily.   Torsemide  40 MG Tabs Take 40 mg by mouth 2 (two) times daily.   valACYclovir  500 MG tablet Commonly known as: VALTREX  Take 1 tablet (500 mg total) by mouth daily for 5 days. What changed:  medication strength how much to take when to take this   Vitamin D  (Cholecalciferol ) 25 MCG (1000 UT) Tabs Take 1,000 Units by mouth daily.        Contact information for after-discharge care     Destination     [facility] Preferred SNF .   Service: Skilled Nursing Contact information: [address] [place]   [address] [phone]                    Discharge Exam: Filed Weights   10/16/23 2342 10/18/23 0500 10/20/23 0434  Weight: 61 kg 61.6 kg 60.2 kg   Vitals:   10/24/23 0621 10/24/23 1147  BP: (!) 153/58 (!) 134/54  Pulse: 68 64  Resp: 16 20  Temp: 98.3 F (36.8 C) 98.6 F (37 C)  SpO2: 98% 100%   Examination: Physical Exam:  Constitutional: WN/WD elderly African-American female in no acute distress Respiratory: Diminished to auscultation bilaterally, no wheezing, rales, rhonchi or crackles. Normal respiratory effort and patient is not tachypenic. No accessory muscle use.  Unlabored breathing Cardiovascular:  RRR, no murmurs / rubs / gallops. S1 and S2 auscultated. No extremity edema Abdomen: Soft, non-tender. Bowel sounds positive.  GU: Deferred. Musculoskeletal: No clubbing / cyanosis of digits/nails. No joint deformity upper and lower extremities.  Skin: She has some dry lesions on her neck and upper chest wall on the right Neurologic: CN 2-12 grossly intact with no focal deficits. Romberg sign and cerebellar reflexes not assessed.  Psychiatric: Normal judgment and insight. Alert and oriented x 3.   Condition at discharge: stable  The results of significant diagnostics from this hospitalization (including imaging, microbiology,  ancillary and laboratory) are listed below for reference.   Imaging Studies: ECHOCARDIOGRAM COMPLETE Result Date: 10/19/2023    ECHOCARDIOGRAM REPORT   Patient Name:   TORIE TOWLE [facility] Date of Exam: 10/19/2023 Medical Rec #:  [id number]          Height:       62.0 in Accession #:    [id number]         Weight:       135.8 lb Date of Birth:  01/08/1934           BSA:          1.622 m Patient Age:    89 years           BP:           172/52 mmHg Patient Gender: F                  HR:           68 bpm. Exam Location:  Inpatient Procedure: 2D Echo, Cardiac Doppler and Color Doppler (Both Spectral and Color            Flow Doppler were utilized during procedure). Indications:    Endocarditis  History:        Patient has prior history of Echocardiogram examinations, most                 recent 09/23/2023.  Sonographer:    Hersey Lorenzo RDCS Referring Phys: [id number] [facility] IMPRESSIONS  1. Left ventricular ejection fraction, by estimation, is 45 to 50%. The left ventricle has mildly decreased function. The left ventricle demonstrates global hypokinesis. The left ventricular internal cavity size was mildly dilated. There is severe left ventricular hypertrophy. Left ventricular diastolic parameters are indeterminate.  2. Right ventricular systolic function is normal. The right ventricular  size is normal.  3. Left atrial size was moderately dilated.  4. Right atrial size was moderately dilated.  5. The mitral valve is abnormal. Mild mitral valve regurgitation. No evidence of mitral stenosis.  6. The aortic valve is tricuspid. There is moderate calcification of the aortic valve. There is moderate thickening of the aortic valve. Aortic valve regurgitation is not visualized. Aortic valve sclerosis/calcification is present, without any evidence of aortic stenosis.  7. The inferior vena cava is normal in size with greater than 50% respiratory variability, suggesting right atrial pressure of 3 mmHg. FINDINGS  Left Ventricle: Left ventricular ejection fraction, by estimation, is 45 to 50%. The left ventricle has mildly decreased function. The left ventricle demonstrates global hypokinesis. Strain was performed and the global longitudinal strain is indeterminate. The left ventricular internal cavity size was mildly dilated. There is severe left ventricular hypertrophy. Left ventricular diastolic parameters are indeterminate. Right Ventricle: The right ventricular size is normal. No increase in right ventricular wall thickness. Right ventricular systolic function is normal. Left Atrium: Left atrial size was moderately dilated. Right Atrium: Right atrial size was moderately dilated. Pericardium: Trivial pericardial effusion is present. The pericardial effusion is posterior to the left ventricle. Mitral Valve: The mitral valve is abnormal. There is moderate thickening of the mitral valve leaflet(s). There is moderate calcification of the mitral valve leaflet(s). Mild mitral valve regurgitation. No evidence of mitral valve stenosis. Tricuspid Valve: The tricuspid valve is normal in structure. Tricuspid valve regurgitation is mild . No evidence of tricuspid stenosis. Aortic Valve: The aortic valve is tricuspid. There is moderate calcification of the aortic valve. There is moderate thickening of the  aortic valve.  Aortic valve regurgitation is not visualized. Aortic valve sclerosis/calcification is present, without any  evidence of aortic stenosis. Pulmonic Valve: The pulmonic valve was normal in structure. Pulmonic valve regurgitation is not visualized. No evidence of pulmonic stenosis. Aorta: The aortic root is normal in size and structure. Venous: The inferior vena cava is normal in size with greater than 50% respiratory variability, suggesting right atrial pressure of 3 mmHg. IAS/Shunts: No atrial level shunt detected by color flow Doppler. Additional Comments: 3D was performed not requiring image post processing on an independent workstation and was indeterminate.  LEFT VENTRICLE PLAX 2D LVIDd:         4.70 cm   Diastology LVIDs:         3.70 cm   LV e' medial:  3.26 cm/s LV PW:         1.60 cm   LV e' lateral: 3.26 cm/s LV IVS:        1.60 cm LVOT diam:     2.20 cm LV SV:         82 LV SV Index:   50 LVOT Area:     3.80 cm  RIGHT VENTRICLE             IVC RV S prime:     13.60 cm/s  IVC diam: 1.70 cm TAPSE (M-mode): 2.4 cm LEFT ATRIUM              Index        RIGHT ATRIUM           Index LA diam:        4.70 cm  2.90 cm/m   RA Area:     16.80 cm LA Vol (A2C):   135.0 ml 83.25 ml/m  RA Volume:   46.70 ml  28.80 ml/m LA Vol (A4C):   110.0 ml 67.83 ml/m LA Biplane Vol: 124.0 ml 76.46 ml/m  AORTIC VALVE LVOT Vmax:   94.60 cm/s LVOT Vmean:  63.700 cm/s LVOT VTI:    0.215 m  AORTA Ao Root diam: 2.90 cm Ao Asc diam:  3.10 cm TRICUSPID VALVE TR Peak grad:   35.3 mmHg TR Vmax:        297.00 cm/s  SHUNTS Systemic VTI:  0.22 m Systemic Diam: 2.20 cm Janelle Mediate MD Electronically signed by Janelle Mediate MD Signature Date/Time: 10/19/2023/9:37:01 AM    Final    MR BRAIN WO CONTRAST Result Date: 10/17/2023 CLINICAL DATA:  Provided history: Mental status change, unknown cause. EXAM: MRI HEAD WITHOUT CONTRAST TECHNIQUE: Multiplanar, multiecho pulse sequences of the brain and surrounding structures were obtained without  intravenous contrast. COMPARISON:  Non-contrast head CT performed earlier today 10/17/2023. Brain MRI 04/25/2023. FINDINGS: Brain: Mild generalized cerebral atrophy. Patchy and ill-defined hypoattenuation within the cerebral white matter, nonspecific but compatible with minimal chronic small vessel ischemic disease. No cortical encephalomalacia is identified. There is no acute infarct. No evidence of an intracranial mass. No chronic intracranial blood products. No extra-axial fluid collection. No midline shift. Vascular: Maintained flow voids within the proximal large arterial vessels. Skull and upper cervical spine: No focal worrisome marrow lesion. Sinuses/Orbits: Bilateral proptosis. Prior bilateral ocular lens replacement. Opacification of a single right ethmoid air cell. IMPRESSION: 1.  No evidence of an acute intracranial abnormality. 2. Minor chronic small vessel ischemic changes within the cerebral white matter, similar to the prior brain MRI of 04/25/2023. 3. Mild generalized cerebral atrophy. 4. Bilateral proptosis. Electronically Signed   By: [name]  Jenean Minus D.O.   On: 10/17/2023 12:46   EEG adult Result Date: 10/17/2023 Arleene Lack, MD     10/17/2023  9:45 PM Patient Name: Lenora Gomes MRN: [id number] Epilepsy Attending: Arleene Lack Referring Physician/Provider: Ozell Blunt, MD Date: 10/17/2023 Duration: 22.41 mins Patient history: 89yo F with ams. EEG to evaluate for seizure Level of alertness: comatose/lethargic AEDs during EEG study: None Technical aspects: This EEG study was done with scalp electrodes positioned according to the 10-20 International system of electrode placement. Electrical activity was reviewed with band pass filter of 1-70Hz , sensitivity of 7 uV/mm, display speed of 80mm/sec with a 60Hz  notched filter applied as appropriate. EEG data were recorded continuously and digitally stored.  Video monitoring was available and reviewed as appropriate. Description: EEG showed  continuous generalized 3 to 6 Hz theta-delta slowing. Intermittently, generalized periodic discharges with triphasic morphology at 1 Hz were also noted, more prominent when awake/stimulated. Hyperventilation and photic stimulation were not performed.   ABNORMALITY - Periodic discharges with triphasic morphology, generalized ( GPDs) - Continuous slow, generalized IMPRESSION: This study is suggestive of moderate to severe diffuse encephalopathy likely related to toxic-metabolic etiology. No seizures or definite epileptiform discharges were seen throughout the recording. Arleene Lack   CT HEAD CODE STROKE WO CONTRAST Result Date: 10/17/2023 CLINICAL DATA:  Code stroke. Neuro deficit, acute, stroke suspected. EXAM: CT HEAD WITHOUT CONTRAST TECHNIQUE: Contiguous axial images were obtained from the base of the skull through the vertex without intravenous contrast. RADIATION DOSE REDUCTION: This exam was performed according to the departmental dose-optimization program which includes automated exposure control, adjustment of the mA and/or kV according to patient size and/or use of iterative reconstruction technique. COMPARISON:  Head CT 10/14/2023. FINDINGS: Brain: Generalized cerebral atrophy. Mild chronic small vessel ischemic changes within the cerebral white matter, better appreciated on the prior brain MRI of 04/25/2023. There is no acute intracranial hemorrhage. No demarcated cortical infarct. No extra-axial fluid collection. No evidence of an intracranial mass. No midline shift. Vascular: No hyperdense vessel. Atherosclerotic calcifications. Skull: No calvarial fracture or aggressive osseous lesion. Sinuses/Orbits: No mass or acute finding within the imaged orbits. Opacification of a single anterior right ethmoid air cell. ASPECTS [facility] Stroke Program Early CT Score) - Ganglionic level infarction (caudate, lentiform nuclei, internal capsule, insula, M1-M3 cortex): 7 - Supraganglionic infarction (M4-M6  cortex): 3 Total score (0-10 with 10 being normal): 10 No evidence of an acute intracranial abnormality. These results were communicated to Dr. Cleone Dad at 9:04 amon 6/4/2025by text page via the [facility] messaging system. IMPRESSION: 1.  No evidence of an acute intracranial abnormality. 2. Parenchymal atrophy and chronic small vessel ischemic disease. 3. Minor right ethmoid sinus disease. Electronically Signed   By: Bascom Lily D.O.   On: 10/17/2023 09:04   DG ABD ACUTE 2+V W 1V CHEST Result Date: 10/15/2023 EXAM: SUPINE XRAY 1 VIEW(S) OF THE ABDOMEN AND 1 VIEW(S) OF THE CHEST 10/15/2023 03:18:15 AM COMPARISON: CT chest dated 09/22/2023. CLINICAL HISTORY: 696295 Nausea \\T \ vomiting [id number]. N\T\V FINDINGS: LUNGS AND PLEURA: Residual mild patchy right upper lobe opacity, suspicious for pneumonia, although improved. No definite pleural effusions. HEART AND MEDIASTINUM: Cardiomegaly. BOWEL: No bowel obstruction. PERITONEUM AND SOFT TISSUES: No abnormal calcifications. No free air. BONES: No acute osseous abnormality. IMPRESSION: 1. Residual mild patchy right upper lobe opacity, suspicious for pneumonia, although improved. 2. Cardiomegaly. Electronically signed by: Zadie Herter MD 10/15/2023 03:22 AM EDT RP Workstation: [id number]   CT Head Wo Contrast Result Date: 10/14/2023  CLINICAL DATA:  Mental status changes, unknown cause, with nausea. Recently began medication for shingles outbreak. EXAM: CT HEAD WITHOUT CONTRAST TECHNIQUE: Contiguous axial images were obtained from the base of the skull through the vertex without intravenous contrast. RADIATION DOSE REDUCTION: This exam was performed according to the departmental dose-optimization program which includes automated exposure control, adjustment of the mA and/or kV according to patient size and/or use of iterative reconstruction technique. COMPARISON:  Head CT 04/25/2023, MRI brain 04/25/2023. FINDINGS: Brain: There is mild cerebrocerebellar atrophy, mild  small-vessel disease and atrophic ventriculomegaly. No cortical based acute infarct, hemorrhage, mass, mass effect or midline shift are seen. The basal cisterns are clear. Vascular: Patchy calcific plaque both siphons, both distal vertebral arteries. No hyperdense central vessel is seen. Skull: Negative for fractures or focal lesions. Sinuses/Orbits: Negative orbits with old lens extractions. Mild chronic disease right ethmoid sinus. Other paranasal sinuses, mastoid air cells and bilateral middle ears are clear. The nasal septum is midline. Other: None. IMPRESSION: 1. No acute intracranial CT findings. 2. Atrophy and small-vessel disease.  Stable exam. 3. Intracranial atherosclerosis. Electronically Signed   By: Denman Fischer M.D.   On: 10/14/2023 20:06   Microbiology: Results for orders placed or performed during the hospital encounter of 10/14/23  Culture, blood (Routine X 2) w Reflex to ID Panel     Status: Abnormal   Collection Time: 10/15/23  2:33 AM   Specimen: BLOOD  Result Value Ref Range Status   Specimen Description   Final    BLOOD BLOOD RIGHT ARM Performed at [facility], [address], [place], [place] [address]    Special Requests   Final    BOTTLES DRAWN AEROBIC AND ANAEROBIC Blood Culture adequate volume Performed at [facility], [address], [place], [place] [address]    Culture  Setup Time   Final    GRAM POSITIVE COCCI IN PAIRS IN CHAINS AEROBIC BOTTLE ONLY CRITICAL RESULT CALLED TO, READ BACK BY AND VERIFIED WITH: PHARMD L POINDEXTER 10/16/2023 @ 0409 BY AB Performed at [facility], [address] [place], [place] [address]    Culture STREPTOCOCCUS PARASANGUINIS (A)  Final   Report Status 10/18/2023 FINAL  Final   Organism ID, Bacteria STREPTOCOCCUS PARASANGUINIS  Final      Susceptibility   Streptococcus parasanguinis - MIC*    PENICILLIN <=0.06 SENSITIVE Sensitive     CEFTRIAXONE  <=0.12 SENSITIVE Sensitive      ERYTHROMYCIN >=8 RESISTANT Resistant     LEVOFLOXACIN  2 SENSITIVE Sensitive     VANCOMYCIN  0.5 SENSITIVE Sensitive     * STREPTOCOCCUS PARASANGUINIS  Blood Culture ID Panel (Reflexed)     Status: Abnormal   Collection Time: 10/15/23  2:33 AM  Result Value Ref Range Status   Enterococcus faecalis NOT DETECTED NOT DETECTED Final   Enterococcus Faecium NOT DETECTED NOT DETECTED Final   Listeria monocytogenes NOT DETECTED NOT DETECTED Final   Staphylococcus species NOT DETECTED NOT DETECTED Final   Staphylococcus aureus (BCID) NOT DETECTED NOT DETECTED Final   Staphylococcus epidermidis NOT DETECTED NOT DETECTED Final   Staphylococcus lugdunensis NOT DETECTED NOT DETECTED Final   Streptococcus species DETECTED (A) NOT DETECTED Final    Comment: Not Enterococcus species, Streptococcus agalactiae, Streptococcus pyogenes, or Streptococcus pneumoniae. CRITICAL RESULT CALLED TO, READ BACK BY AND VERIFIED WITH: PHARMD L POINDEXTER 10/16/2023 @ 0409 BY AB    Streptococcus agalactiae NOT DETECTED NOT DETECTED Final   Streptococcus pneumoniae NOT DETECTED NOT DETECTED Final  Streptococcus pyogenes NOT DETECTED NOT DETECTED Final   A.calcoaceticus-baumannii NOT DETECTED NOT DETECTED Final   Bacteroides fragilis NOT DETECTED NOT DETECTED Final   Enterobacterales NOT DETECTED NOT DETECTED Final   Enterobacter cloacae complex NOT DETECTED NOT DETECTED Final   Escherichia coli NOT DETECTED NOT DETECTED Final   Klebsiella aerogenes NOT DETECTED NOT DETECTED Final   Klebsiella oxytoca NOT DETECTED NOT DETECTED Final   Klebsiella pneumoniae NOT DETECTED NOT DETECTED Final   Proteus species NOT DETECTED NOT DETECTED Final   Salmonella species NOT DETECTED NOT DETECTED Final   Serratia marcescens NOT DETECTED NOT DETECTED Final   Haemophilus influenzae NOT DETECTED NOT DETECTED Final   Neisseria meningitidis NOT DETECTED NOT DETECTED Final   Pseudomonas aeruginosa NOT DETECTED NOT DETECTED Final    Stenotrophomonas maltophilia NOT DETECTED NOT DETECTED Final   Candida albicans NOT DETECTED NOT DETECTED Final   Candida auris NOT DETECTED NOT DETECTED Final   Candida glabrata NOT DETECTED NOT DETECTED Final   Candida krusei NOT DETECTED NOT DETECTED Final   Candida parapsilosis NOT DETECTED NOT DETECTED Final   Candida tropicalis NOT DETECTED NOT DETECTED Final   Cryptococcus neoformans/gattii NOT DETECTED NOT DETECTED Final    Comment: Performed at [facility], [address] [place], [place] [address]  Culture, blood (Routine X 2) w Reflex to ID Panel     Status: None   Collection Time: 10/15/23  9:39 PM   Specimen: BLOOD  Result Value Ref Range Status   Specimen Description   Final    BLOOD BLOOD RIGHT HAND Performed at [facility], [address], [place], [place] [address]    Special Requests   Final    BOTTLES DRAWN AEROBIC AND ANAEROBIC Blood Culture results may not be optimal due to an inadequate volume of blood received in culture bottles Performed at [facility], [address] [place], [place] [address]    Culture   Final    NO GROWTH 5 DAYS Performed at [facility], [address] [place], [place] [address]    Report Status 10/20/2023 FINAL  Final  MRSA Next Gen by PCR, Nasal     Status: None   Collection Time: 10/15/23  9:52 PM   Specimen: Nasal Mucosa; Nasal Swab  Result Value Ref Range Status   MRSA by PCR Next Gen NOT DETECTED NOT DETECTED Final    Comment: (NOTE) The GeneXpert MRSA Assay (FDA approved for NASAL specimens only), is one component of a comprehensive MRSA colonization surveillance program. It is not intended to diagnose MRSA infection nor to guide or monitor treatment for MRSA infections. Test performance is not FDA approved in patients less than 88 years old. Performed at [facility], [address] [place], [place] [address]   Culture, blood (Routine X 2) w Reflex  to ID Panel     Status: None   Collection Time: 10/18/23  1:40 PM   Specimen: BLOOD RIGHT HAND  Result Value Ref Range Status   Specimen Description   Final    BLOOD RIGHT HAND Performed at [facility], [address] [place], [place] [address]    Special Requests   Final    BOTTLES DRAWN AEROBIC AND ANAEROBIC Blood Culture adequate volume Performed at [facility], [address], [place], [place] [address]    Culture   Final    NO GROWTH 5 DAYS Performed at [facility], [address] [place], [place] [address]    Report Status 10/23/2023 FINAL  Final  Culture, blood (Routine X 2) w Reflex to ID Panel     Status: None   Collection Time: 10/18/23  1:40 PM   Specimen: BLOOD LEFT ARM  Result Value Ref Range Status   Specimen Description   Final    BLOOD LEFT ARM Performed at [facility], [address] [place], [place] [address]    Special Requests   Final    BOTTLES DRAWN AEROBIC AND ANAEROBIC Blood Culture adequate volume Performed at [facility], [address] [place], [place] [address]    Culture   Final    NO GROWTH 5 DAYS Performed at [facility], [address], [place], [place] [address]    Report Status 10/23/2023 FINAL  Final  Varicella-zoster by PCR     Status: Abnormal   Collection Time: 10/19/23  7:44 AM   Specimen: Other Source; Sterile Swab  Result Value Ref Range Status   Varicella-Zoster, PCR Positive (A) Negative Final    Comment: (NOTE) Varicella Zoster Virus DNA detected. This test was developed and its performance characteristics determined by [facility].  It has not been cleared or approved by the Food and Drug Administration.  The FDA has determined that such clearance or approval is not necessary. Performed At: [facility] [address] [place], [place] [id number] Pearlean Botts MD MV:[id number]    *Note: Due to a large number of results and/or encounters for the  requested time period, some results have not been displayed. A complete set of results can be found in Results Review.   Labs: CBC: Recent Labs  Lab 10/20/23 0311 10/21/23 0335 10/24/23 0920  WBC 3.8* 4.3 4.7  NEUTROABS  --   --  3.1  HGB 9.2* 8.9* 8.5*  HCT 29.8* 28.6* 27.0*  MCV 98.3 97.9 95.4  PLT 146* 144* 198   Basic Metabolic Panel: Recent Labs  Lab 10/20/23 0311 10/21/23 0335 10/22/23 0401 10/24/23 0920  NA 136 139  --  135  K 4.0 4.1  --  3.8  CL 106 106  --  99  CO2 20* 21*  --  24  GLUCOSE 109* 106*  --  107*  BUN 79* 82*  --  75*  CREATININE 5.09* 5.31* 5.17* 5.46*  CALCIUM  7.8* 7.9*  --  7.8*  MG  --   --   --  1.8  PHOS  --   --   --  6.0*   Liver Function Tests: Recent Labs  Lab 10/20/23 0311 10/21/23 0335 10/24/23 0920  AST 14* 13* 14*  ALT 6 6 7   ALKPHOS 32* 32* 38  BILITOT 0.5 0.5 0.7  PROT 4.9* 5.1* 5.4*  ALBUMIN  2.2* 2.2* 1.9*   CBG: Recent Labs  Lab 10/23/23 0757  GLUCAP 108*   Discharge time spent: greater than 30 minutes.  Signed: Aura Leeds, DO [facility] 10/24/2023

## 2023-10-25 DIAGNOSIS — B029 Zoster without complications: Secondary | ICD-10-CM | POA: Diagnosis not present

## 2023-10-25 DIAGNOSIS — N189 Chronic kidney disease, unspecified: Secondary | ICD-10-CM | POA: Diagnosis not present

## 2023-10-25 DIAGNOSIS — I132 Hypertensive heart and chronic kidney disease with heart failure and with stage 5 chronic kidney disease, or end stage renal disease: Secondary | ICD-10-CM | POA: Diagnosis not present

## 2023-10-25 DIAGNOSIS — A409 Streptococcal sepsis, unspecified: Secondary | ICD-10-CM | POA: Diagnosis not present

## 2023-10-25 DIAGNOSIS — A879 Viral meningitis, unspecified: Secondary | ICD-10-CM | POA: Diagnosis not present

## 2023-10-26 DIAGNOSIS — Z741 Need for assistance with personal care: Secondary | ICD-10-CM | POA: Diagnosis not present

## 2023-10-26 DIAGNOSIS — M6281 Muscle weakness (generalized): Secondary | ICD-10-CM | POA: Diagnosis not present

## 2023-10-26 DIAGNOSIS — J4540 Moderate persistent asthma, uncomplicated: Secondary | ICD-10-CM | POA: Diagnosis not present

## 2023-10-26 DIAGNOSIS — A409 Streptococcal sepsis, unspecified: Secondary | ICD-10-CM | POA: Diagnosis not present

## 2023-10-26 DIAGNOSIS — N189 Chronic kidney disease, unspecified: Secondary | ICD-10-CM | POA: Diagnosis not present

## 2023-10-26 DIAGNOSIS — A419 Sepsis, unspecified organism: Secondary | ICD-10-CM | POA: Diagnosis not present

## 2023-10-26 DIAGNOSIS — I132 Hypertensive heart and chronic kidney disease with heart failure and with stage 5 chronic kidney disease, or end stage renal disease: Secondary | ICD-10-CM | POA: Diagnosis not present

## 2023-10-26 DIAGNOSIS — E441 Mild protein-calorie malnutrition: Secondary | ICD-10-CM | POA: Diagnosis not present

## 2023-10-26 DIAGNOSIS — B029 Zoster without complications: Secondary | ICD-10-CM | POA: Diagnosis not present

## 2023-10-26 DIAGNOSIS — A879 Viral meningitis, unspecified: Secondary | ICD-10-CM | POA: Diagnosis not present

## 2023-10-26 DIAGNOSIS — R2689 Other abnormalities of gait and mobility: Secondary | ICD-10-CM | POA: Diagnosis not present

## 2023-10-29 DIAGNOSIS — N189 Chronic kidney disease, unspecified: Secondary | ICD-10-CM | POA: Diagnosis not present

## 2023-10-29 DIAGNOSIS — A409 Streptococcal sepsis, unspecified: Secondary | ICD-10-CM | POA: Diagnosis not present

## 2023-10-29 DIAGNOSIS — I509 Heart failure, unspecified: Secondary | ICD-10-CM | POA: Diagnosis not present

## 2023-10-29 DIAGNOSIS — I1311 Hypertensive heart and chronic kidney disease without heart failure, with stage 5 chronic kidney disease, or end stage renal disease: Secondary | ICD-10-CM | POA: Diagnosis not present

## 2023-10-30 DIAGNOSIS — M6281 Muscle weakness (generalized): Secondary | ICD-10-CM | POA: Diagnosis not present

## 2023-10-30 DIAGNOSIS — E441 Mild protein-calorie malnutrition: Secondary | ICD-10-CM | POA: Diagnosis not present

## 2023-10-30 DIAGNOSIS — J4540 Moderate persistent asthma, uncomplicated: Secondary | ICD-10-CM | POA: Diagnosis not present

## 2023-10-30 DIAGNOSIS — A419 Sepsis, unspecified organism: Secondary | ICD-10-CM | POA: Diagnosis not present

## 2023-10-30 DIAGNOSIS — R2689 Other abnormalities of gait and mobility: Secondary | ICD-10-CM | POA: Diagnosis not present

## 2023-10-30 DIAGNOSIS — Z741 Need for assistance with personal care: Secondary | ICD-10-CM | POA: Diagnosis not present

## 2023-10-31 DIAGNOSIS — B029 Zoster without complications: Secondary | ICD-10-CM | POA: Diagnosis not present

## 2023-10-31 DIAGNOSIS — N189 Chronic kidney disease, unspecified: Secondary | ICD-10-CM | POA: Diagnosis not present

## 2023-10-31 DIAGNOSIS — I1311 Hypertensive heart and chronic kidney disease without heart failure, with stage 5 chronic kidney disease, or end stage renal disease: Secondary | ICD-10-CM | POA: Diagnosis not present

## 2023-10-31 DIAGNOSIS — A409 Streptococcal sepsis, unspecified: Secondary | ICD-10-CM | POA: Diagnosis not present

## 2023-11-01 DIAGNOSIS — N185 Chronic kidney disease, stage 5: Secondary | ICD-10-CM | POA: Diagnosis not present

## 2023-11-01 DIAGNOSIS — I132 Hypertensive heart and chronic kidney disease with heart failure and with stage 5 chronic kidney disease, or end stage renal disease: Secondary | ICD-10-CM | POA: Diagnosis not present

## 2023-11-01 DIAGNOSIS — E872 Acidosis, unspecified: Secondary | ICD-10-CM | POA: Diagnosis not present

## 2023-11-01 DIAGNOSIS — I272 Pulmonary hypertension, unspecified: Secondary | ICD-10-CM | POA: Diagnosis not present

## 2023-11-01 DIAGNOSIS — E1122 Type 2 diabetes mellitus with diabetic chronic kidney disease: Secondary | ICD-10-CM | POA: Diagnosis not present

## 2023-11-01 DIAGNOSIS — K219 Gastro-esophageal reflux disease without esophagitis: Secondary | ICD-10-CM | POA: Diagnosis not present

## 2023-11-01 DIAGNOSIS — I2489 Other forms of acute ischemic heart disease: Secondary | ICD-10-CM | POA: Diagnosis not present

## 2023-11-01 DIAGNOSIS — J9601 Acute respiratory failure with hypoxia: Secondary | ICD-10-CM | POA: Diagnosis not present

## 2023-11-01 DIAGNOSIS — I5043 Acute on chronic combined systolic (congestive) and diastolic (congestive) heart failure: Secondary | ICD-10-CM | POA: Diagnosis not present

## 2023-11-01 DIAGNOSIS — I160 Hypertensive urgency: Secondary | ICD-10-CM | POA: Diagnosis not present

## 2023-11-01 DIAGNOSIS — D631 Anemia in chronic kidney disease: Secondary | ICD-10-CM | POA: Diagnosis not present

## 2023-11-01 DIAGNOSIS — J4540 Moderate persistent asthma, uncomplicated: Secondary | ICD-10-CM | POA: Diagnosis not present

## 2023-11-02 DIAGNOSIS — A409 Streptococcal sepsis, unspecified: Secondary | ICD-10-CM | POA: Diagnosis not present

## 2023-11-02 DIAGNOSIS — Z741 Need for assistance with personal care: Secondary | ICD-10-CM | POA: Diagnosis not present

## 2023-11-02 DIAGNOSIS — G9349 Other encephalopathy: Secondary | ICD-10-CM | POA: Diagnosis not present

## 2023-11-02 DIAGNOSIS — N185 Chronic kidney disease, stage 5: Secondary | ICD-10-CM | POA: Diagnosis not present

## 2023-11-02 DIAGNOSIS — R7881 Bacteremia: Secondary | ICD-10-CM | POA: Diagnosis not present

## 2023-11-02 DIAGNOSIS — I110 Hypertensive heart disease with heart failure: Secondary | ICD-10-CM | POA: Diagnosis not present

## 2023-11-02 DIAGNOSIS — M6281 Muscle weakness (generalized): Secondary | ICD-10-CM | POA: Diagnosis not present

## 2023-11-02 DIAGNOSIS — J4540 Moderate persistent asthma, uncomplicated: Secondary | ICD-10-CM | POA: Diagnosis not present

## 2023-11-02 DIAGNOSIS — R2689 Other abnormalities of gait and mobility: Secondary | ICD-10-CM | POA: Diagnosis not present

## 2023-11-02 DIAGNOSIS — A419 Sepsis, unspecified organism: Secondary | ICD-10-CM | POA: Diagnosis not present

## 2023-11-02 DIAGNOSIS — E441 Mild protein-calorie malnutrition: Secondary | ICD-10-CM | POA: Diagnosis not present

## 2023-11-02 DIAGNOSIS — N189 Chronic kidney disease, unspecified: Secondary | ICD-10-CM | POA: Diagnosis not present

## 2023-11-05 DIAGNOSIS — A409 Streptococcal sepsis, unspecified: Secondary | ICD-10-CM | POA: Diagnosis not present

## 2023-11-05 DIAGNOSIS — I132 Hypertensive heart and chronic kidney disease with heart failure and with stage 5 chronic kidney disease, or end stage renal disease: Secondary | ICD-10-CM | POA: Diagnosis not present

## 2023-11-05 DIAGNOSIS — I509 Heart failure, unspecified: Secondary | ICD-10-CM | POA: Diagnosis not present

## 2023-11-05 DIAGNOSIS — N189 Chronic kidney disease, unspecified: Secondary | ICD-10-CM | POA: Diagnosis not present

## 2023-11-06 ENCOUNTER — Inpatient Hospital Stay: Attending: Nurse Practitioner

## 2023-11-06 ENCOUNTER — Inpatient Hospital Stay

## 2023-11-06 DIAGNOSIS — R2689 Other abnormalities of gait and mobility: Secondary | ICD-10-CM | POA: Diagnosis not present

## 2023-11-06 DIAGNOSIS — E441 Mild protein-calorie malnutrition: Secondary | ICD-10-CM | POA: Diagnosis not present

## 2023-11-06 DIAGNOSIS — A419 Sepsis, unspecified organism: Secondary | ICD-10-CM | POA: Diagnosis not present

## 2023-11-06 DIAGNOSIS — Z741 Need for assistance with personal care: Secondary | ICD-10-CM | POA: Diagnosis not present

## 2023-11-06 DIAGNOSIS — M6281 Muscle weakness (generalized): Secondary | ICD-10-CM | POA: Diagnosis not present

## 2023-11-06 DIAGNOSIS — J4540 Moderate persistent asthma, uncomplicated: Secondary | ICD-10-CM | POA: Diagnosis not present

## 2023-11-07 DIAGNOSIS — A409 Streptococcal sepsis, unspecified: Secondary | ICD-10-CM | POA: Diagnosis not present

## 2023-11-07 DIAGNOSIS — I132 Hypertensive heart and chronic kidney disease with heart failure and with stage 5 chronic kidney disease, or end stage renal disease: Secondary | ICD-10-CM | POA: Diagnosis not present

## 2023-11-07 DIAGNOSIS — B028 Zoster with other complications: Secondary | ICD-10-CM | POA: Diagnosis not present

## 2023-11-07 DIAGNOSIS — I509 Heart failure, unspecified: Secondary | ICD-10-CM | POA: Diagnosis not present

## 2023-11-07 DIAGNOSIS — N189 Chronic kidney disease, unspecified: Secondary | ICD-10-CM | POA: Diagnosis not present

## 2023-11-08 ENCOUNTER — Ambulatory Visit (INDEPENDENT_AMBULATORY_CARE_PROVIDER_SITE_OTHER): Admitting: Internal Medicine

## 2023-11-08 ENCOUNTER — Other Ambulatory Visit: Payer: Self-pay

## 2023-11-08 VITALS — BP 181/65 | HR 61 | Temp 97.8°F

## 2023-11-08 DIAGNOSIS — R7881 Bacteremia: Secondary | ICD-10-CM

## 2023-11-08 MED ORDER — CALAMINE PLUS 1-8 % EX LOTN: TOPICAL_LOTION | CUTANEOUS | 0 refills | Status: AC

## 2023-11-08 NOTE — Progress Notes (Signed)
 Patient: [name]  DOB: Oct 28, 1933 MRN: [id number] PCP: Charlett Apolinar POUR, MD    Chief Complaint  Patient presents with   Follow-up    [facility]/      Patient Active Problem List   Diagnosis Date Noted   Hypothermia 10/15/2023   Sepsis (HCC) 10/15/2023   Adverse effect of valaciclovir 10/14/2023   Acute encephalopathy 10/14/2023   Acute exacerbation of CHF (congestive heart failure) (HCC) 09/22/2023   Acute kidney injury superimposed on stage 5 chronic kidney disease, not on chronic dialysis (HCC) 08/08/2023   Hypertensive nephrosclerosis 08/08/2023   Anemia of chronic kidney failure, stage 5 (HCC) 08/08/2023   Metabolic acidosis 08/08/2023   Acute exacerbation of moderate persistent extrinsic asthma 08/08/2023   Acute on chronic heart failure with preserved ejection fraction (HFpEF) (HCC) 08/08/2023   AKI (acute kidney injury) (HCC) 08/08/2023   CAP (community acquired pneumonia) 10/31/2022   Hypertensive urgency 10/31/2022   Moderate persistent asthma 10/31/2022   Hypomagnesemia 10/31/2022   CKD (chronic kidney disease), stage IV (HCC) 10/31/2022   Pulmonary hypertension, unspecified (HCC) 12/06/2021   Chronic cough 10/18/2021   Chronic rhinitis 11/24/2019   Nausea 11/03/2018   Elevated troponin 11/03/2018   Hyponatremia 11/03/2018   Hypertensive heart disease with hypertensive chronic kidney disease 11/30/2017   Fasting hyperglycemia 11/30/2017   Renal cyst 03/27/2016   CKD (chronic kidney disease) stage 3, GFR 30-59 ml/min (HCC) 08/23/2015   Subjective hearing change 06/29/2014   Resistant hypertension 06/29/2014   Lumbago 06/15/2014   Pre-diabetes vs early DM  06/15/2014   Dyspnea 04/27/2014   Asthma, chronic 04/13/2014   Elevated uric acid in blood 04/13/2014   Essential hypertension 03/27/2014   History of DVT (deep vein thrombosis)    Palpitations 01/05/2014   Anemia of chronic disease 2013-12-22   Death of family member 26-Aug-2013    Leg edema 06/20/2013   Bereavement due to life event 04/21/2013   Other dysphagia 02/11/2013   Colon cancer screening 02/11/2013   Anxiety state 01/20/2013   Leg cramps 01/20/2013   Back pain 12/05/2012   Family hx of colon cancer 10/21/2012   Family hx of lung cancer 10/21/2012   Family hx-breast malignancy 10/21/2012   Renal insufficiency creatinine 1.3 10/21/2012   Anemia, chronic disease 10/21/2012   GERD (gastroesophageal reflux disease) 09/08/2012   HTN (hypertension) 09/08/2012   Hyperlipidemia 09/08/2012   Varicose veins of leg with complications 12/05/2010     Subjective:  [name] is a 89 y.o. female with past medical history as below presents for hospital follow-up of stroke.  Symptoms bacteremia and zoster encephalitis.  She was treated initially for shingles rash with Valtrex  1 g 3 times daily, was not recently Drozd.  Presented with altered mental status and rash.  ID was engaged found to have strep para sanguinous bacteremia thought to be secondary to skin breakdown as well on admission.  Blood cultures grew 1/4 strep.  TTE negative for vegetation.  Forewent TES repeat blood cultures.  And we had a source.  She was treated with acyclovir  and her mentation improved discharged on Valtrex , renally dosed 500 mg every 24 hours x 10 days EOT 6/14.  For the bacteremia she was treated with ceftriaxone  inpatient and discharged on Keflex  to complete 2 weeks antibiotics EOT 6/18.  Today: Presents with daughter.  She states rash is much better, she has calamine lotion on it as it itches at some point.  Rash is healed.  Denies fevers and chills.  Review of Systems  All other systems reviewed and are negative.   Past Medical History:  Diagnosis Date   Acute superficial venous thrombosis of left lower extremity 03/27/2014   concern about hx and potential extensive on exam tenderness calf and low dose lovenox  40 qd 2-4 weekselevetion and close  fu advised .    Allergy     Anemia    Anxiety    Arthritis    shoulders (09/09/2012)   Asthma 09/08/2012   Carotid bruit    us  2018  low risk 1 - 39%   Cataract    bil cateracts removed   CKD (chronic kidney disease) stage 5, GFR less than 15 ml/min (HCC)    as of may 2025, creat 4.5- 5.5 range   Clotting disorder (HCC)    blood clots in legs   Diabetes mellitus without complication (HCC)    Borderline per pt; being monitored.  no meds per pt   Diverticulosis    Dyspnea 04/27/2014   GERD (gastroesophageal reflux disease)    Headache(784.0)    related to my high blood pressure (09/09/2012)   Hiatal hernia    History of DVT (deep vein thrombosis)    RLE (09/09/2012) coumadine cant take asa so on plavix     HTN (hypertension) 09/08/2012   Hyperlipidemia    Lupus    cured years ago (09/09/2012)   Other dysphagia 02/11/2013   Syncope and collapse 01/21/2018   Varicose veins    Varicose veins of leg with complications 12/05/2010    Outpatient Medications Prior to Visit  Medication Sig Dispense Refill   albuterol  (PROVENTIL ) (2.5 MG/3ML) 0.083% nebulizer solution Take 3 mLs (2.5 mg total) by nebulization every 6 (six) hours as needed for shortness of breath or wheezing. 360 mL 6   albuterol  (VENTOLIN  HFA) 108 (90 Base) MCG/ACT inhaler USE 2 PUFFS EVERY 6 HOURS AS NEEDED FOR WHEEZING. 8.5 g 3   carvedilol  (COREG ) 12.5 MG tablet Take 1 tablet (12.5 mg total) by mouth 2 (two) times daily. 180 tablet 3   citalopram  (CELEXA ) 20 MG tablet Take 0.5 tablets (10 mg total) by mouth daily. 45 tablet 1   cloNIDine  (CATAPRES ) 0.1 MG tablet TAKE ONE TABLET BY MOUTH TWICE DAILY AS NEEDED for hypertension 180 tablet 1   ferrous sulfate  325 (65 FE) MG EC tablet Take 1 tablet (325 mg total) by mouth daily with breakfast. 30 tablet 3   fluticasone  (FLONASE ) 50 MCG/ACT nasal spray Place 2 sprays into both nostrils as needed for allergies or rhinitis.     hydrALAZINE  (APRESOLINE ) 100 MG tablet Take 1 tablet (100 mg total) by  mouth 3 (three) times daily. 270 tablet 2   montelukast  (SINGULAIR ) 10 MG tablet Take 1 tablet (10 mg total) by mouth daily. 90 tablet 3   acetaminophen  (TYLENOL ) 325 MG tablet Take 2 tablets (650 mg total) by mouth every 6 (six) hours as needed for mild pain (pain score 1-3) (or Fever >/= 101). 20 tablet 0   amLODipine  (NORVASC ) 10 MG tablet Take 1 tablet (10 mg total) by mouth daily. (Patient taking differently: Take 10 mg by mouth at bedtime.) 30 tablet 1   Artificial Tear Solution (GENTEAL TEARS) 0.1-0.2-0.3 % SOLN Place 2 drops into both eyes as needed. 15 mL 0   Budeson-Glycopyrrol-Formoterol  (BREZTRI  AEROSPHERE) 160-9-4.8 MCG/ACT AERO INHALE 2 PUFFS INTO THE LUNGS TWICE DAILY, IN THE MORNING & AT BEDTIME. (Patient taking differently: Inhale 2 puffs into the lungs 2 (  two) times daily as needed (wheezing, shortness of breath).) 10.7 g 11   lidocaine  (LIDODERM ) 5 % Place 1 patch onto the skin daily. Remove & Discard patch within 12 hours or as directed by MD 30 patch 0   LORazepam  (ATIVAN ) 0.5 MG tablet Take 1 tablet (0.5 mg total) by mouth 2 (two) times daily as needed. for anxiety 10 tablet 0   Nutritional Supplements (FEEDING SUPPLEMENT, NEPRO CARB STEADY,) LIQD Take 237 mLs by mouth 2 (two) times daily between meals. 2370 mL 0   sodium bicarbonate  650 MG tablet Take 1 tablet (650 mg total) by mouth 2 (two) times daily. 60 tablet 0   Spacer/Aero-Holding Chambers (AEROCHAMBER PLUS) inhaler Use as instructed 1 each 2   torsemide  40 MG TABS Take 40 mg by mouth 2 (two) times daily. 60 tablet 1   vitamin B-12 (CYANOCOBALAMIN ) 1000 MCG tablet Take 1,000 mcg by mouth daily.     Vitamin D , Cholecalciferol , 25 MCG (1000 UT) TABS Take 1,000 Units by mouth daily. 60 tablet 2   No facility-administered medications prior to visit.     Allergies  Allergen Reactions   Penicillins Hives   Asa [Aspirin] Other (See Comments)    Wheezing Acetaminophen  is OK    Fish Allergy Itching, Nausea And Vomiting  and Swelling    Swelling of the face    Social History   Tobacco Use   Smoking status: Never   Smokeless tobacco: Never  Vaping Use   Vaping status: Never Used  Substance Use Topics   Alcohol  use: No   Drug use: No    Family History  Problem Relation Age of Onset   Hypertension Mother    Lung cancer Mother    Hypertension Father    Lung cancer Father    Breast cancer Sister    Colon cancer Sister        ? 41' s dx - died in 93's   Breast cancer Sister    Hypertension Brother    Rectal cancer Brother    Stomach cancer Brother    Breast cancer Brother    Heart attack Daughter    Esophageal cancer Daughter    Hypertension Daughter    Diabetes Daughter    Breast cancer Paternal Aunt    Lung cancer Other        both parents    Pancreatic cancer Neg Hx     Objective:   Vitals:   11/08/23 1000  BP: (!) 181/65  Pulse: 61  Temp: 97.8 F (36.6 C)  TempSrc: Temporal  SpO2: 98%   There is no height or weight on file to calculate BMI.  Physical Exam Constitutional:      Appearance: Normal appearance.  HENT:     Head: Normocephalic and atraumatic.     Right Ear: Tympanic membrane normal.     Left Ear: Tympanic membrane normal.     Nose: Nose normal.     Mouth/Throat:     Mouth: Mucous membranes are moist.  Eyes:     Extraocular Movements: Extraocular movements intact.     Conjunctiva/sclera: Conjunctivae normal.     Pupils: Pupils are equal, round, and reactive to light.  Cardiovascular:     Rate and Rhythm: Normal rate and regular rhythm.     Heart sounds: No murmur heard.    No friction rub. No gallop.  Pulmonary:     Effort: Pulmonary effort is normal.     Breath sounds: Normal breath sounds.  Abdominal:  General: Abdomen is flat.     Palpations: Abdomen is soft.  Musculoskeletal:        General: Normal range of motion.  Skin:    General: Skin is warm and dry.  Neurological:     General: No focal deficit present.     Mental Status: She is  alert and oriented to person, place, and time.  Psychiatric:        Mood and Affect: Mood normal.     Lab Results: Lab Results  Component Value Date   WBC 4.7 10/24/2023   HGB 8.5 (L) 10/24/2023   HCT 27.0 (L) 10/24/2023   MCV 95.4 10/24/2023   PLT 198 10/24/2023    Lab Results  Component Value Date   CREATININE 5.46 (H) 10/24/2023   BUN 75 (H) 10/24/2023   NA 135 10/24/2023   K 3.8 10/24/2023   CL 99 10/24/2023   CO2 24 10/24/2023    Lab Results  Component Value Date   ALT 7 10/24/2023   AST 14 (L) 10/24/2023   ALKPHOS 38 10/24/2023   BILITOT 0.7 10/24/2023     Assessment & Plan:  #Strep parasnaguinis  bacteremia 2/2 skin breakdown #CKD V #PCN allergy, hives #Zoster encephalitis - Patient was admitted with altered mental status.  Few days prior she had send started on Valtrex  1 g 3 times daily, not renally dosed then due to altered mental status.  Valtrex  initially held.  ID was engaged.  She was transition to acyclovir  during hospitalization.  Mentation improved.  Completed 10 days of antiviral therapy about Valtrex . - Blood culture on admission grew 1/4 bottles strep para segments.  TTE no vegetation.  Forewent TEE as blood cultures were negative on repetition.  Bacteria thought secondary to skin breakdown due to rash. -6/11 labs stable Plan: - Patient completed Valtrex  on 6/14.  Keflex  for bacteremia on 6/18.  Her rash has healed although itchy at times.  Prescribed calamine lotion with Benadryl .  Blood cultures today off of antibiotics.  She denies fevers or chills. - Follow with ID as needed. Rash is healed but ithcy, putting calmine lotion.  Blood cx F/U PRN  Loney Stank, MD [facility]   11/08/23  10:06 AM   I have personally spent 45 minutes involved in face-to-face and non-face-to-face activities for this patient on the day of the visit. Professional time spent includes the following activities:  Preparing to see the patient (review of tests), Obtaining and/or reviewing separately obtained history (admission/discharge record), Performing a medically appropriate examination and/or evaluation , Ordering medications/tests/procedures, referring and communicating with other health care professionals, Documenting clinical information in the EMR, Independently interpreting results (not separately reported), Communicating results to the patient/family/caregiver, Counseling and educating the patient/family/caregiver and Care coordination (not separately reported).  e

## 2023-11-09 ENCOUNTER — Telehealth: Payer: Self-pay | Admitting: Internal Medicine

## 2023-11-09 DIAGNOSIS — N189 Chronic kidney disease, unspecified: Secondary | ICD-10-CM | POA: Diagnosis not present

## 2023-11-09 DIAGNOSIS — I509 Heart failure, unspecified: Secondary | ICD-10-CM | POA: Diagnosis not present

## 2023-11-09 DIAGNOSIS — B029 Zoster without complications: Secondary | ICD-10-CM | POA: Diagnosis not present

## 2023-11-09 DIAGNOSIS — A409 Streptococcal sepsis, unspecified: Secondary | ICD-10-CM | POA: Diagnosis not present

## 2023-11-09 DIAGNOSIS — I132 Hypertensive heart and chronic kidney disease with heart failure and with stage 5 chronic kidney disease, or end stage renal disease: Secondary | ICD-10-CM | POA: Diagnosis not present

## 2023-11-09 NOTE — Telephone Encounter (Signed)
 Copied from CRM [id number]. Topic: Appointments - Appointment Scheduling >> Nov 08, 2023  5:24 PM Shereese L wrote: Patient/patient representative is calling to schedule an appointment. Patient discharged 06/27 needs a hospital follow up... Patient needs an earlier appt within the 14 day time frame /call pt back and left message to call back about a early appt

## 2023-11-13 ENCOUNTER — Telehealth: Payer: Self-pay | Admitting: *Deleted

## 2023-11-13 NOTE — Telephone Encounter (Signed)
 Copied from CRM [id number]. Topic: Clinical - Home Health Verbal Orders >> Nov 13, 2023  1:39 PM Aleatha BROCKS wrote: Caller/Agency: Monique from [facility] Callback Number: [phone] Service Requested: Physical Therapy Frequency: 1 week  2 week 3 1 week  3 Any new concerns about the patient? No

## 2023-11-14 LAB — CULTURE, BLOOD (SINGLE)
MICRO NUMBER:: 16630048
Result:: NO GROWTH
SPECIMEN QUALITY:: ADEQUATE

## 2023-11-15 NOTE — Telephone Encounter (Signed)
 Reach out to [facility] and inform her of approval from provider. No further action is needed.

## 2023-11-25 ENCOUNTER — Other Ambulatory Visit: Payer: Self-pay | Admitting: Emergency Medicine

## 2023-11-29 ENCOUNTER — Emergency Department (HOSPITAL_COMMUNITY)

## 2023-11-29 ENCOUNTER — Emergency Department (HOSPITAL_COMMUNITY)
Admission: EM | Admit: 2023-11-29 | Discharge: 2023-11-30 | Discharge status: Against Medical Advice | Source: Home / Self Care

## 2023-11-29 ENCOUNTER — Other Ambulatory Visit: Payer: Self-pay

## 2023-11-29 DIAGNOSIS — I5043 Acute on chronic combined systolic (congestive) and diastolic (congestive) heart failure: Secondary | ICD-10-CM | POA: Diagnosis not present

## 2023-11-29 DIAGNOSIS — R079 Chest pain, unspecified: Secondary | ICD-10-CM | POA: Insufficient documentation

## 2023-11-29 DIAGNOSIS — I160 Hypertensive urgency: Secondary | ICD-10-CM | POA: Diagnosis not present

## 2023-11-29 DIAGNOSIS — K219 Gastro-esophageal reflux disease without esophagitis: Secondary | ICD-10-CM | POA: Diagnosis not present

## 2023-11-29 DIAGNOSIS — R0989 Other specified symptoms and signs involving the circulatory and respiratory systems: Secondary | ICD-10-CM | POA: Diagnosis not present

## 2023-11-29 DIAGNOSIS — I358 Other nonrheumatic aortic valve disorders: Secondary | ICD-10-CM | POA: Diagnosis not present

## 2023-11-29 DIAGNOSIS — E1122 Type 2 diabetes mellitus with diabetic chronic kidney disease: Secondary | ICD-10-CM | POA: Diagnosis not present

## 2023-11-29 DIAGNOSIS — G4700 Insomnia, unspecified: Secondary | ICD-10-CM | POA: Diagnosis not present

## 2023-11-29 DIAGNOSIS — D631 Anemia in chronic kidney disease: Secondary | ICD-10-CM | POA: Diagnosis not present

## 2023-11-29 DIAGNOSIS — R0602 Shortness of breath: Secondary | ICD-10-CM | POA: Diagnosis not present

## 2023-11-29 DIAGNOSIS — J4540 Moderate persistent asthma, uncomplicated: Secondary | ICD-10-CM | POA: Diagnosis not present

## 2023-11-29 DIAGNOSIS — I517 Cardiomegaly: Secondary | ICD-10-CM | POA: Diagnosis not present

## 2023-11-29 DIAGNOSIS — I132 Hypertensive heart and chronic kidney disease with heart failure and with stage 5 chronic kidney disease, or end stage renal disease: Secondary | ICD-10-CM | POA: Diagnosis not present

## 2023-11-29 DIAGNOSIS — Z880 Allergy status to penicillin: Secondary | ICD-10-CM | POA: Diagnosis not present

## 2023-11-29 DIAGNOSIS — I509 Heart failure, unspecified: Secondary | ICD-10-CM | POA: Diagnosis not present

## 2023-11-29 DIAGNOSIS — N179 Acute kidney failure, unspecified: Secondary | ICD-10-CM | POA: Diagnosis not present

## 2023-11-29 DIAGNOSIS — N185 Chronic kidney disease, stage 5: Secondary | ICD-10-CM | POA: Diagnosis not present

## 2023-11-29 DIAGNOSIS — Z5321 Procedure and treatment not carried out due to patient leaving prior to being seen by health care provider: Secondary | ICD-10-CM | POA: Insufficient documentation

## 2023-11-29 DIAGNOSIS — I110 Hypertensive heart disease with heart failure: Secondary | ICD-10-CM | POA: Diagnosis not present

## 2023-11-29 DIAGNOSIS — Z66 Do not resuscitate: Secondary | ICD-10-CM | POA: Diagnosis not present

## 2023-11-29 DIAGNOSIS — Z8249 Family history of ischemic heart disease and other diseases of the circulatory system: Secondary | ICD-10-CM | POA: Diagnosis not present

## 2023-11-29 DIAGNOSIS — Z79899 Other long term (current) drug therapy: Secondary | ICD-10-CM | POA: Diagnosis not present

## 2023-11-29 DIAGNOSIS — Z515 Encounter for palliative care: Secondary | ICD-10-CM | POA: Diagnosis not present

## 2023-11-29 DIAGNOSIS — Z833 Family history of diabetes mellitus: Secondary | ICD-10-CM | POA: Diagnosis not present

## 2023-11-29 DIAGNOSIS — E785 Hyperlipidemia, unspecified: Secondary | ICD-10-CM | POA: Diagnosis not present

## 2023-11-29 DIAGNOSIS — Z886 Allergy status to analgesic agent status: Secondary | ICD-10-CM | POA: Diagnosis not present

## 2023-11-29 DIAGNOSIS — I2489 Other forms of acute ischemic heart disease: Secondary | ICD-10-CM | POA: Diagnosis not present

## 2023-11-29 NOTE — ED Triage Notes (Signed)
 Patient reports intermittent mid chest pain with mild SOB this evening , no emesis or diaphoresis .

## 2023-11-30 DIAGNOSIS — R0989 Other specified symptoms and signs involving the circulatory and respiratory systems: Secondary | ICD-10-CM | POA: Diagnosis not present

## 2023-11-30 DIAGNOSIS — R079 Chest pain, unspecified: Secondary | ICD-10-CM | POA: Diagnosis not present

## 2023-11-30 DIAGNOSIS — I517 Cardiomegaly: Secondary | ICD-10-CM | POA: Diagnosis not present

## 2023-11-30 LAB — PROTIME-INR
INR: 1 (ref 0.8–1.2)
Prothrombin Time: 13.4 s (ref 11.4–15.2)

## 2023-11-30 LAB — BASIC METABOLIC PANEL WITH GFR
Anion gap: 13 (ref 5–15)
BUN: 75 mg/dL — ABNORMAL HIGH (ref 8–23)
CO2: 18 mmol/L — ABNORMAL LOW (ref 22–32)
Calcium: 8.4 mg/dL — ABNORMAL LOW (ref 8.9–10.3)
Chloride: 109 mmol/L (ref 98–111)
Creatinine, Ser: 5.24 mg/dL — ABNORMAL HIGH (ref 0.44–1.00)
GFR, Estimated: 7 mL/min — ABNORMAL LOW (ref >60)
Glucose, Bld: 123 mg/dL — ABNORMAL HIGH (ref 70–99)
Potassium: 5 mmol/L (ref 3.5–5.1)
Sodium: 140 mmol/L (ref 135–145)

## 2023-11-30 LAB — CBC
HCT: 24.9 % — ABNORMAL LOW (ref 36.0–46.0)
Hemoglobin: 7.5 g/dL — ABNORMAL LOW (ref 12.0–15.0)
MCH: 30.1 pg (ref 26.0–34.0)
MCHC: 30.1 g/dL (ref 30.0–36.0)
MCV: 100 fL (ref 80.0–100.0)
Platelets: 211 K/uL (ref 150–400)
RBC: 2.49 MIL/uL — ABNORMAL LOW (ref 3.87–5.11)
RDW: 14.5 % (ref 11.5–15.5)
WBC: 5.8 K/uL (ref 4.0–10.5)
nRBC: 0 % (ref 0.0–0.2)

## 2023-11-30 LAB — BRAIN NATRIURETIC PEPTIDE: B Natriuretic Peptide: 1067.4 pg/mL — ABNORMAL HIGH (ref 0.0–100.0)

## 2023-11-30 LAB — TROPONIN I (HIGH SENSITIVITY): Troponin I (High Sensitivity): 158 ng/L (ref <18)

## 2023-11-30 NOTE — ED Notes (Signed)
 Dr. Raford notified on patient 's abnormal blood tests results .

## 2023-11-30 NOTE — ED Notes (Signed)
 Patient was leaving stated she did not want to stay any longer.

## 2023-12-02 ENCOUNTER — Emergency Department (HOSPITAL_COMMUNITY)

## 2023-12-02 ENCOUNTER — Encounter (HOSPITAL_COMMUNITY): Payer: Self-pay | Admitting: *Deleted

## 2023-12-02 ENCOUNTER — Other Ambulatory Visit: Payer: Self-pay

## 2023-12-02 ENCOUNTER — Inpatient Hospital Stay (HOSPITAL_COMMUNITY)
Admission: EM | Admit: 2023-12-02 | Discharge: 2023-12-10 | DRG: 291 | Disposition: A | Discharge status: Home Health | Attending: Family Medicine | Admitting: Family Medicine

## 2023-12-02 DIAGNOSIS — Z8249 Family history of ischemic heart disease and other diseases of the circulatory system: Secondary | ICD-10-CM | POA: Diagnosis not present

## 2023-12-02 DIAGNOSIS — Z886 Allergy status to analgesic agent status: Secondary | ICD-10-CM

## 2023-12-02 DIAGNOSIS — Z66 Do not resuscitate: Secondary | ICD-10-CM | POA: Diagnosis present

## 2023-12-02 DIAGNOSIS — R079 Chest pain, unspecified: Secondary | ICD-10-CM | POA: Diagnosis not present

## 2023-12-02 DIAGNOSIS — Z79899 Other long term (current) drug therapy: Secondary | ICD-10-CM

## 2023-12-02 DIAGNOSIS — G4700 Insomnia, unspecified: Secondary | ICD-10-CM | POA: Diagnosis present

## 2023-12-02 DIAGNOSIS — Z515 Encounter for palliative care: Secondary | ICD-10-CM | POA: Diagnosis not present

## 2023-12-02 DIAGNOSIS — N179 Acute kidney failure, unspecified: Secondary | ICD-10-CM | POA: Diagnosis not present

## 2023-12-02 DIAGNOSIS — Z7189 Other specified counseling: Secondary | ICD-10-CM | POA: Diagnosis not present

## 2023-12-02 DIAGNOSIS — Z7951 Long term (current) use of inhaled steroids: Secondary | ICD-10-CM

## 2023-12-02 DIAGNOSIS — E8779 Other fluid overload: Secondary | ICD-10-CM

## 2023-12-02 DIAGNOSIS — Z91018 Allergy to other foods: Secondary | ICD-10-CM

## 2023-12-02 DIAGNOSIS — E785 Hyperlipidemia, unspecified: Secondary | ICD-10-CM | POA: Diagnosis present

## 2023-12-02 DIAGNOSIS — F32A Depression, unspecified: Secondary | ICD-10-CM | POA: Diagnosis present

## 2023-12-02 DIAGNOSIS — F03A3 Unspecified dementia, mild, with mood disturbance: Secondary | ICD-10-CM | POA: Diagnosis present

## 2023-12-02 DIAGNOSIS — F03A18 Unspecified dementia, mild, with other behavioral disturbance: Secondary | ICD-10-CM | POA: Diagnosis present

## 2023-12-02 DIAGNOSIS — E1122 Type 2 diabetes mellitus with diabetic chronic kidney disease: Secondary | ICD-10-CM | POA: Diagnosis present

## 2023-12-02 DIAGNOSIS — I2489 Other forms of acute ischemic heart disease: Secondary | ICD-10-CM | POA: Diagnosis present

## 2023-12-02 DIAGNOSIS — Z800 Family history of malignant neoplasm of digestive organs: Secondary | ICD-10-CM

## 2023-12-02 DIAGNOSIS — I132 Hypertensive heart and chronic kidney disease with heart failure and with stage 5 chronic kidney disease, or end stage renal disease: Principal | ICD-10-CM | POA: Diagnosis present

## 2023-12-02 DIAGNOSIS — R0602 Shortness of breath: Principal | ICD-10-CM

## 2023-12-02 DIAGNOSIS — I160 Hypertensive urgency: Secondary | ICD-10-CM | POA: Diagnosis present

## 2023-12-02 DIAGNOSIS — I110 Hypertensive heart disease with heart failure: Secondary | ICD-10-CM | POA: Diagnosis not present

## 2023-12-02 DIAGNOSIS — D631 Anemia in chronic kidney disease: Secondary | ICD-10-CM | POA: Diagnosis present

## 2023-12-02 DIAGNOSIS — J811 Chronic pulmonary edema: Secondary | ICD-10-CM | POA: Diagnosis not present

## 2023-12-02 DIAGNOSIS — I5043 Acute on chronic combined systolic (congestive) and diastolic (congestive) heart failure: Secondary | ICD-10-CM | POA: Diagnosis present

## 2023-12-02 DIAGNOSIS — K219 Gastro-esophageal reflux disease without esophagitis: Secondary | ICD-10-CM | POA: Diagnosis present

## 2023-12-02 DIAGNOSIS — Z801 Family history of malignant neoplasm of trachea, bronchus and lung: Secondary | ICD-10-CM

## 2023-12-02 DIAGNOSIS — F03A4 Unspecified dementia, mild, with anxiety: Secondary | ICD-10-CM | POA: Diagnosis present

## 2023-12-02 DIAGNOSIS — N185 Chronic kidney disease, stage 5: Secondary | ICD-10-CM | POA: Diagnosis not present

## 2023-12-02 DIAGNOSIS — Z833 Family history of diabetes mellitus: Secondary | ICD-10-CM | POA: Diagnosis not present

## 2023-12-02 DIAGNOSIS — J4540 Moderate persistent asthma, uncomplicated: Secondary | ICD-10-CM | POA: Diagnosis present

## 2023-12-02 DIAGNOSIS — E8770 Fluid overload, unspecified: Secondary | ICD-10-CM | POA: Diagnosis present

## 2023-12-02 DIAGNOSIS — I509 Heart failure, unspecified: Secondary | ICD-10-CM | POA: Diagnosis not present

## 2023-12-02 DIAGNOSIS — Z86718 Personal history of other venous thrombosis and embolism: Secondary | ICD-10-CM

## 2023-12-02 DIAGNOSIS — Z880 Allergy status to penicillin: Secondary | ICD-10-CM | POA: Diagnosis not present

## 2023-12-02 DIAGNOSIS — Z803 Family history of malignant neoplasm of breast: Secondary | ICD-10-CM

## 2023-12-02 DIAGNOSIS — I120 Hypertensive chronic kidney disease with stage 5 chronic kidney disease or end stage renal disease: Secondary | ICD-10-CM | POA: Diagnosis not present

## 2023-12-02 DIAGNOSIS — N186 End stage renal disease: Secondary | ICD-10-CM | POA: Diagnosis not present

## 2023-12-02 DIAGNOSIS — I358 Other nonrheumatic aortic valve disorders: Secondary | ICD-10-CM | POA: Diagnosis present

## 2023-12-02 DIAGNOSIS — R918 Other nonspecific abnormal finding of lung field: Secondary | ICD-10-CM | POA: Diagnosis not present

## 2023-12-02 DIAGNOSIS — Z91148 Patient's other noncompliance with medication regimen for other reason: Secondary | ICD-10-CM

## 2023-12-02 HISTORY — DX: Fluid overload, unspecified: E87.70

## 2023-12-02 LAB — CBC
HCT: 23.7 % — ABNORMAL LOW (ref 36.0–46.0)
HCT: 24.6 % — ABNORMAL LOW (ref 36.0–46.0)
Hemoglobin: 7.4 g/dL — ABNORMAL LOW (ref 12.0–15.0)
Hemoglobin: 7.7 g/dL — ABNORMAL LOW (ref 12.0–15.0)
MCH: 30.3 pg (ref 26.0–34.0)
MCH: 30.2 pg (ref 26.0–34.0)
MCHC: 31.2 g/dL (ref 30.0–36.0)
MCHC: 31.3 g/dL (ref 30.0–36.0)
MCV: 97.1 fL (ref 80.0–100.0)
MCV: 96.5 fL (ref 80.0–100.0)
Platelets: 215 K/uL (ref 150–400)
Platelets: 240 K/uL (ref 150–400)
RBC: 2.44 MIL/uL — ABNORMAL LOW (ref 3.87–5.11)
RBC: 2.55 MIL/uL — ABNORMAL LOW (ref 3.87–5.11)
RDW: 14.7 % (ref 11.5–15.5)
RDW: 14.6 % (ref 11.5–15.5)
WBC: 6 K/uL (ref 4.0–10.5)
WBC: 5.3 K/uL (ref 4.0–10.5)
nRBC: 0 % (ref 0.0–0.2)
nRBC: 0 % (ref 0.0–0.2)

## 2023-12-02 LAB — TSH: TSH: 3.573 u[IU]/mL (ref 0.350–4.500)

## 2023-12-02 LAB — BASIC METABOLIC PANEL WITH GFR
Anion gap: 13 (ref 5–15)
BUN: 75 mg/dL — ABNORMAL HIGH (ref 8–23)
CO2: 17 mmol/L — ABNORMAL LOW (ref 22–32)
Calcium: 8.7 mg/dL — ABNORMAL LOW (ref 8.9–10.3)
Chloride: 108 mmol/L (ref 98–111)
Creatinine, Ser: 5.01 mg/dL — ABNORMAL HIGH (ref 0.44–1.00)
GFR, Estimated: 8 mL/min — ABNORMAL LOW (ref >60)
Glucose, Bld: 117 mg/dL — ABNORMAL HIGH (ref 70–99)
Potassium: 4.8 mmol/L (ref 3.5–5.1)
Sodium: 138 mmol/L (ref 135–145)

## 2023-12-02 LAB — TROPONIN I (HIGH SENSITIVITY)
Troponin I (High Sensitivity): 136 ng/L (ref <18)
Troponin I (High Sensitivity): 102 ng/L (ref <18)

## 2023-12-02 LAB — PHOSPHORUS: Phosphorus: 5.8 mg/dL — ABNORMAL HIGH (ref 2.5–4.6)

## 2023-12-02 LAB — BRAIN NATRIURETIC PEPTIDE: B Natriuretic Peptide: 905.3 pg/mL — ABNORMAL HIGH (ref 0.0–100.0)

## 2023-12-02 LAB — CREATININE, SERUM
Creatinine, Ser: 5.25 mg/dL — ABNORMAL HIGH (ref 0.44–1.00)
GFR, Estimated: 7 mL/min — ABNORMAL LOW (ref >60)

## 2023-12-02 LAB — MAGNESIUM: Magnesium: 2.1 mg/dL (ref 1.7–2.4)

## 2023-12-02 MED ORDER — CITALOPRAM HYDROBROMIDE 10 MG PO TABS
10.0000 mg | ORAL_TABLET | Freq: Every day | ORAL | Status: DC
  Administered 2023-12-02 – 2023-12-10 (×9): 10 mg via ORAL
  Filled 2023-12-02 (×9): qty 1

## 2023-12-02 MED ORDER — CARVEDILOL 12.5 MG PO TABS
12.5000 mg | ORAL_TABLET | Freq: Two times a day (BID) | ORAL | Status: DC
  Administered 2023-12-02 – 2023-12-10 (×16): 12.5 mg via ORAL
  Filled 2023-12-02 (×17): qty 1

## 2023-12-02 MED ORDER — ACETAMINOPHEN 325 MG PO TABS
650.0000 mg | ORAL_TABLET | Freq: Four times a day (QID) | ORAL | Status: DC | PRN
  Administered 2023-12-02 – 2023-12-10 (×10): 650 mg via ORAL
  Filled 2023-12-02 (×11): qty 2

## 2023-12-02 MED ORDER — FUROSEMIDE 10 MG/ML IJ SOLN
80.0000 mg | Freq: Two times a day (BID) | INTRAMUSCULAR | Status: DC
  Administered 2023-12-02 – 2023-12-03 (×4): 80 mg via INTRAVENOUS
  Filled 2023-12-02 (×4): qty 8

## 2023-12-02 MED ORDER — BUDESON-GLYCOPYRROL-FORMOTEROL 160-9-4.8 MCG/ACT IN AERO
2.0000 | INHALATION_SPRAY | Freq: Every day | RESPIRATORY_TRACT | Status: DC
  Filled 2023-12-02: qty 5.9

## 2023-12-02 MED ORDER — BUDESON-GLYCOPYRROL-FORMOTEROL 160-9-4.8 MCG/ACT IN AERO
2.0000 | INHALATION_SPRAY | Freq: Two times a day (BID) | RESPIRATORY_TRACT | Status: DC
  Administered 2023-12-02 – 2023-12-10 (×15): 2 via RESPIRATORY_TRACT
  Filled 2023-12-02: qty 5.9

## 2023-12-02 MED ORDER — ONDANSETRON HCL 4 MG PO TABS: 4.0000 mg | ORAL_TABLET | Freq: Four times a day (QID) | ORAL | Status: DC | PRN

## 2023-12-02 MED ORDER — HEPARIN SODIUM (PORCINE) 5000 UNIT/ML IJ SOLN
5000.0000 [IU] | Freq: Three times a day (TID) | INTRAMUSCULAR | Status: DC
  Administered 2023-12-02 (×3): 5000 [IU] via SUBCUTANEOUS
  Filled 2023-12-02 (×3): qty 1

## 2023-12-02 MED ORDER — CLONIDINE HCL 0.1 MG PO TABS
0.1000 mg | ORAL_TABLET | Freq: Two times a day (BID) | ORAL | Status: DC | PRN
  Administered 2023-12-03 – 2023-12-10 (×5): 0.1 mg via ORAL
  Filled 2023-12-02 (×5): qty 1

## 2023-12-02 MED ORDER — FERROUS SULFATE 325 (65 FE) MG PO TBEC
325.0000 mg | DELAYED_RELEASE_TABLET | Freq: Every day | ORAL | Status: DC
  Filled 2023-12-02 (×2): qty 1

## 2023-12-02 MED ORDER — HYDRALAZINE HCL 50 MG PO TABS
100.0000 mg | ORAL_TABLET | Freq: Three times a day (TID) | ORAL | Status: DC
  Administered 2023-12-02 – 2023-12-10 (×25): 100 mg via ORAL
  Filled 2023-12-02 (×25): qty 2

## 2023-12-02 MED ORDER — ACETAMINOPHEN 650 MG RE SUPP
650.0000 mg | Freq: Four times a day (QID) | RECTAL | Status: DC | PRN
  Filled 2023-12-02: qty 1

## 2023-12-02 MED ORDER — SODIUM BICARBONATE 650 MG PO TABS
650.0000 mg | ORAL_TABLET | Freq: Two times a day (BID) | ORAL | Status: DC
  Administered 2023-12-02 – 2023-12-10 (×17): 650 mg via ORAL
  Filled 2023-12-02 (×17): qty 1

## 2023-12-02 MED ORDER — VITAMIN B-12 1000 MCG PO TABS
1000.0000 ug | ORAL_TABLET | Freq: Every day | ORAL | Status: DC
  Administered 2023-12-02 – 2023-12-10 (×9): 1000 ug via ORAL
  Filled 2023-12-02 (×9): qty 1

## 2023-12-02 MED ORDER — FUROSEMIDE 10 MG/ML IJ SOLN
80.0000 mg | Freq: Once | INTRAMUSCULAR | Status: AC
  Administered 2023-12-02: 80 mg via INTRAVENOUS
  Filled 2023-12-02: qty 8

## 2023-12-02 MED ORDER — MONTELUKAST SODIUM 10 MG PO TABS
10.0000 mg | ORAL_TABLET | Freq: Every day | ORAL | Status: DC
  Administered 2023-12-02 – 2023-12-10 (×9): 10 mg via ORAL
  Filled 2023-12-02 (×9): qty 1

## 2023-12-02 MED ORDER — AMLODIPINE BESYLATE 10 MG PO TABS
10.0000 mg | ORAL_TABLET | Freq: Every day | ORAL | Status: DC
Start: 2023-12-02 — End: 2023-12-10
  Administered 2023-12-02 – 2023-12-10 (×9): 10 mg via ORAL
  Filled 2023-12-02 (×5): qty 1
  Filled 2023-12-02: qty 2
  Filled 2023-12-02 (×3): qty 1

## 2023-12-02 MED ORDER — ALBUTEROL SULFATE (2.5 MG/3ML) 0.083% IN NEBU
2.5000 mg | INHALATION_SOLUTION | RESPIRATORY_TRACT | Status: DC | PRN
  Administered 2023-12-05: 2.5 mg via RESPIRATORY_TRACT
  Filled 2023-12-02: qty 3

## 2023-12-02 MED ORDER — HYDRALAZINE HCL 20 MG/ML IJ SOLN: 10.0000 mg | Freq: Four times a day (QID) | INTRAMUSCULAR | Status: DC | PRN

## 2023-12-02 MED ORDER — FERROUS SULFATE 325 (65 FE) MG PO TABS
325.0000 mg | ORAL_TABLET | Freq: Every day | ORAL | Status: DC
  Administered 2023-12-03 – 2023-12-10 (×8): 325 mg via ORAL
  Filled 2023-12-02 (×8): qty 1

## 2023-12-02 MED ORDER — LORAZEPAM 0.5 MG PO TABS
0.5000 mg | ORAL_TABLET | Freq: Two times a day (BID) | ORAL | Status: DC | PRN
  Administered 2023-12-03 – 2023-12-04 (×2): 0.5 mg via ORAL
  Filled 2023-12-02 (×2): qty 1

## 2023-12-02 MED ORDER — ONDANSETRON HCL 4 MG/2ML IJ SOLN: 4.0000 mg | Freq: Four times a day (QID) | INTRAMUSCULAR | Status: DC | PRN

## 2023-12-02 NOTE — Plan of Care (Signed)
   Problem: Education: Goal: Knowledge of General Education information will improve Description Including pain rating scale, medication(s)/side effects and non-pharmacologic comfort measures Outcome: Progressing

## 2023-12-02 NOTE — ED Provider Notes (Signed)
 [place] [facility] Provider Note   CSN: [id number] Arrival date & time: 12/02/23  0203     History Chief Complaint  Patient presents with   Shortness of Breath    HPI [name] is a 89 y.o. female presenting for chief complaint of shortness of breath. She states that she has been feeling progressively shortness of breath over the last 3 days.  Worsening p.o. intake into the day today.  Decreased urine output despite compliance on her torsemide  40.  States that she has needed metolazone  in the past.  Patient's recorded medical, surgical, social, medication list and allergies were reviewed in the Snapshot window as part of the initial history.   Review of Systems   Review of Systems  Constitutional:  Positive for fatigue. Negative for chills and fever.  HENT:  Negative for ear pain and sore throat.   Eyes:  Negative for pain and visual disturbance.  Respiratory:  Positive for shortness of breath. Negative for cough.   Cardiovascular:  Positive for leg swelling. Negative for chest pain and palpitations.  Gastrointestinal:  Negative for abdominal pain and vomiting.  Genitourinary:  Negative for dysuria and hematuria.  Musculoskeletal:  Negative for arthralgias and back pain.  Skin:  Negative for color change and rash.  Neurological:  Negative for seizures and syncope.  All other systems reviewed and are negative.   Physical Exam Updated Vital Signs BP (!) 180/77 (BP Location: Right Arm)   Pulse 75   Temp 97.6 F (36.4 C) (Oral)   Resp 19   Ht 5' 2 (1.575 m)   Wt 60.2 kg   SpO2 98%   BMI 24.27 kg/m  Physical Exam Vitals and nursing note reviewed.  Constitutional:      General: She is not in acute distress.    Appearance: She is well-developed.  HENT:     Head: Normocephalic and atraumatic.  Eyes:     Conjunctiva/sclera: Conjunctivae normal.  Cardiovascular:     Rate and Rhythm: Normal rate and regular rhythm.      Heart sounds: No murmur heard. Pulmonary:     Effort: Pulmonary effort is normal. No respiratory distress.     Breath sounds: Rales present.  Abdominal:     Palpations: Abdomen is soft.     Tenderness: There is no abdominal tenderness.  Musculoskeletal:        General: No swelling.     Cervical back: Neck supple.     Right lower leg: Edema present.     Left lower leg: Edema present.  Skin:    General: Skin is warm and dry.     Capillary Refill: Capillary refill takes less than 2 seconds.  Neurological:     Mental Status: She is alert.  Psychiatric:        Mood and Affect: Mood normal.      ED Course/ Medical Decision Making/ A&P    Procedures Procedures   Medications Ordered in ED Medications  furosemide  (LASIX ) injection 80 mg (has no administration in time range)   Medical Decision Making:   [name] is a 89 y.o. female with a history of CHF, who presented to the ED today with acute on chronic SOB. They are endorsing worsening of their baseline dyspnea over the past 12 hours. Their baseline is a 0L O2 requirement. At their baseline they are able to get around the house and they are not able to at this time.   On  my initial exam, the pt was SOB and tachypneic. LE edema present.  They are not endorsing cough/fever/sputum production.    Reviewed and confirmed nursing documentation for past medical history, family history, social history.    Initial Assessment:   With the patient's presentation of SOB in the above setting, most likely diagnosis is CHF Exacerbation. Other diagnoses were considered including (but not limited to) CAP, PE, ACS, viral infection, PTX. These are considered less likely due to history of present illness and physical exam findings.   This is most consistent with an acute life/limb threatening illness complicated by underlying chronic conditions.  Initial Plan:  Empiric treatment of patient's symptoms with oxygen  administration. EKG/Troponin testing/BNP testing to evaluate for cardiac pathology. Evaluation for infectious versus intrathoracic abnormality with chest x-ray  Evaluation for volume overload with BNP  Screening labs including CBC and Metabolic panel to evaluate for infectious or metabolic etiology of disease.  Patient's Wells score is low and patient does not warrant further objective evaluation for PE based on consistency of presentation of alternative diagnosis.  Objective evaluation as below reviewed   Initial Study Results:   Laboratory  All laboratory results reviewed without evidence of clinically relevant pathology.   Exceptions include: Mild troponin elevation  EKG EKG was reviewed independently. Rate, rhythm, axis, intervals all examined and without medically relevant abnormality. ST segments without concerns for elevations.    Radiology:  All images reviewed independently. Agree with radiology report at this time.   DG Chest Portable 1 View Result Date: 12/02/2023 EXAM: 1 VIEW XRAY OF THE CHEST 12/02/2023 03:03:00 AM COMPARISON: 11/30/2023 CLINICAL HISTORY: Chest pain, shortness of breath with feet and ankles swollen for 2 days. FINDINGS: LUNGS AND PLEURA: Mild interstitial edema. Possible small bilateral pleural effusions. No pneumothorax. HEART AND MEDIASTINUM: Cardiomegaly. BONES AND SOFT TISSUES: No acute osseous abnormality. IMPRESSION: 1. Cardiomegaly with mild interstitial edema. 2. Possible small bilateral pleural effusions. Electronically signed by: Pinkie Pebbles MD 12/02/2023 03:05 AM EDT RP Workstation: [id number]   DG Chest 2 View Result Date: 11/30/2023 CLINICAL DATA:  Chest pain EXAM: CHEST - 2 VIEW COMPARISON:  10/15/2023 FINDINGS: Cardiac shadow is enlarged. Increased central vascular congestion is noted. Patchy airspace opacities are noted bilaterally likely related to edema. No focal confluent infiltrate is seen. No bony abnormality is noted. IMPRESSION: Increased  patchy opacities likely related to edema. Electronically Signed   By: Oneil Devonshire M.D.   On: 11/30/2023 00:13     .   Final Assessment and Plan:   She was initiated on diureses above typical dose. Reassessed after initiation of medical therapies, patient is grossly improved and no longer in acute distress.    Given the advanced nature of the patient's presentation, patient will require advanced care and admission was arranged.      Clinical Impression:  1. SOB (shortness of breath)   2. Acute on chronic congestive heart failure, unspecified heart failure type (HCC)      Admit   Clinical Impression:  1. SOB (shortness of breath)   2. Acute on chronic congestive heart failure, unspecified heart failure type (HCC)      Admit   Final Clinical Impression(s) / ED Diagnoses Final diagnoses:  SOB (shortness of breath)  Acute on chronic congestive heart failure, unspecified heart failure type [facility])    Rx / DC Orders ED Discharge Orders     None         Jerral Meth, MD 12/02/23 [phone]

## 2023-12-02 NOTE — H&P (Signed)
 History and Physical    [name] FMW:[id number] DOB: 02-Feb-1934 DOA: 12/02/2023  PCP: Charlett Apolinar POUR, MD  Patient coming from: Home  I have personally briefly reviewed patient's old medical records in [facility]  Chief Complaint: Shortness of breath and leg edema  HPI: Cherolyn Behrle is a 89 y.o. female with medical history significant of CKD stage V, hypertension, asthma, anemia of chronic disease, chronic combined systolic and diastolic CHF, anxiety, GERD, hypertension, hyperlipidemia presented to emergency department with shortness of breath and leg edema since 2-3 days.  Patient reports that she has worsening of shortness of breath along with bilateral leg edema, orthopnea, some cough associated with wheezing which she thinks is related to her underlying asthma.  No fever, chills, nausea, vomiting, urgency, frequency, dysuria however noticed decreased urinary output despite compliance with her torsemide .  She does have a history of CKD stage V, followed by nephrology Dr. Gearline.  She is compliant with her medications at home.  Her niece lives with her.  She uses cane for ambulation.  No recent fall or injury.  No prior history of tobacco abuse, alcohol  abuse, licit drug use.  ED Course: Upon arrival to ED: Patient afebrile, pulse 75, RR 19, BP 180/77, maintaining oxygen saturation on room air.  NA 138, K: 4.8, CO2 17, BUN 75, creatinine 5.01, GFR 8.  No leukocytosis, H&H 7.4/23.7, PLT 215.  Troponin 136 trended down to 102.  BNP 905.  Chest x-ray shows cardiomegaly with mild interstitial edema.  Possible small bilateral pleural effusion.  Patient was given Lasix  80 mg IV once in ER.  [facility] hospital[facility] consulted for admission for fluid overload in the setting of CKD and underlying CHF  Review of Systems: As per HPI otherwise negative.    Past Medical History:  Diagnosis Date   Acute superficial venous thrombosis of left lower extremity 03/27/2014   concern about hx  and potential extensive on exam tenderness calf and low dose lovenox  40 qd 2-4 weekselevetion and close  fu advised .    Allergy    Anemia    Anxiety    Arthritis    shoulders (09/09/2012)   Asthma 09/08/2012   Carotid bruit    us  2018  low risk 1 - 39%   Cataract    bil cateracts removed   CKD (chronic kidney disease) stage 5, GFR less than 15 ml/min (HCC)    as of may 2025, creat 4.5- 5.5 range   Clotting disorder (HCC)    blood clots in legs   Diabetes mellitus without complication (HCC)    Borderline per pt; being monitored.  no meds per pt   Diverticulosis    Dyspnea 04/27/2014   GERD (gastroesophageal reflux disease)    Headache(784.0)    related to my high blood pressure (09/09/2012)   Hiatal hernia    History of DVT (deep vein thrombosis)    RLE (09/09/2012) coumadine cant take asa so on plavix     HTN (hypertension) 09/08/2012   Hyperlipidemia    Lupus    cured years ago (09/09/2012)   Other dysphagia 02/11/2013   Syncope and collapse 01/21/2018   Varicose veins    Varicose veins of leg with complications 12/05/2010    Past Surgical History:  Procedure Laterality Date   ABDOMINAL HYSTERECTOMY     partial   BREAST EXCISIONAL BIOPSY Right    BREAST SURGERY     CARPAL TUNNEL RELEASE Right 2000's   CARPAL TUNNEL RELEASE Bilateral 10/21/2013  Procedure: BILATERAL  CARPAL TUNNEL RELEASE;  Surgeon: Arley JONELLE Curia, MD;  Location: [facility];  Service: Orthopedics;  Laterality: Bilateral;   COLONOSCOPY     SHOULDER OPEN ROTATOR CUFF REPAIR Bilateral 2000's   TRIGGER FINGER RELEASE Right 10/21/2013   Procedure: RELEASE TRIGGER FINGER/A-1 PULLEY RIGHT MIDDLE AND RIGHT RING;  Surgeon: Arley JONELLE Curia, MD;  Location: [facility];  Service: Orthopedics;  Laterality: Right;   UPPER GASTROINTESTINAL ENDOSCOPY       reports that she has never smoked. She has never used smokeless tobacco. She reports that she does not drink alcohol  and does not  use drugs.  Allergies  Allergen Reactions   Penicillins Hives   Asa [Aspirin] Other (See Comments)    Wheezing Acetaminophen  is OK    Fish Allergy Itching, Nausea And Vomiting and Swelling    Swelling of the face    Family History  Problem Relation Age of Onset   Hypertension Mother    Lung cancer Mother    Hypertension Father    Lung cancer Father    Breast cancer Sister    Colon cancer Sister        ? 73' s dx - died in 64's   Breast cancer Sister    Hypertension Brother    Rectal cancer Brother    Stomach cancer Brother    Breast cancer Brother    Heart attack Daughter    Esophageal cancer Daughter    Hypertension Daughter    Diabetes Daughter    Breast cancer Paternal Aunt    Lung cancer Other        both parents    Pancreatic cancer Neg Hx     Prior to Admission medications   Medication Sig Start Date End Date Taking? Authorizing Provider  acetaminophen  (TYLENOL ) 325 MG tablet Take 2 tablets (650 mg total) by mouth every 6 (six) hours as needed for mild pain (pain score 1-3) (or Fever >/= 101). 09/26/23  Yes Regalado, Belkys A, MD  albuterol  (PROVENTIL ) (2.5 MG/3ML) 0.083% nebulizer solution Take 3 mLs (2.5 mg total) by nebulization every 6 (six) hours as needed for shortness of breath or wheezing. 08/03/22  Yes Enedelia Dorna HERO, FNP  albuterol  (VENTOLIN  HFA) 108 (90 Base) MCG/ACT inhaler USE 2 PUFFS EVERY 6 HOURS AS NEEDED FOR WHEEZING. 11/26/23  Yes Byrum, Lamar RAMAN, MD  amLODipine  (NORVASC ) 10 MG tablet Take 1 tablet (10 mg total) by mouth daily. 11/07/22  Yes Verdene Purchase, MD  Artificial Tear Solution (GENTEAL TEARS) 0.1-0.2-0.3 % SOLN Place 2 drops into both eyes as needed. 10/24/23  Yes Sheikh, Omair Latif, DO  Budeson-Glycopyrrol-Formoterol  (BREZTRI  AEROSPHERE) 160-9-4.8 MCG/ACT AERO INHALE 2 PUFFS INTO THE LUNGS TWICE DAILY, IN THE MORNING & AT BEDTIME. 04/19/23  Yes Byrum, Lamar RAMAN, MD  carvedilol  (COREG ) 12.5 MG tablet Take 1 tablet (12.5 mg total) by  mouth 2 (two) times daily. 04/06/23  Yes Nahser, Aleene PARAS, MD  citalopram  (CELEXA ) 20 MG tablet Take 0.5 tablets (10 mg total) by mouth daily. 08/07/23  Yes Panosh, Wanda K, MD  cloNIDine  (CATAPRES ) 0.1 MG tablet TAKE ONE TABLET BY MOUTH TWICE DAILY AS NEEDED for hypertension 08/07/23  Yes Panosh, Wanda K, MD  diphenhydramine -calamine (CALAMINE PLUS) 1-8 % LOTN On lesion  bid as needed Patient taking differently: Apply 1 application  topically daily as needed (for itching). 11/08/23  Yes Dennise Kingsley, MD  ferrous sulfate  325 (65 FE) MG EC tablet Take 1 tablet (325 mg total)  by mouth daily with breakfast. 12/15/14  Yes Panosh, Wanda K, MD  fluticasone  (FLONASE ) 50 MCG/ACT nasal spray Place 2 sprays into both nostrils as needed for allergies or rhinitis.   Yes [provider]  hydrALAZINE  (APRESOLINE ) 100 MG tablet Take 1 tablet (100 mg total) by mouth 3 (three) times daily. 05/21/23  Yes Nahser, Aleene PARAS, MD  lidocaine  (LIDODERM ) 5 % Place 1 patch onto the skin daily. Remove & Discard patch within 12 hours or as directed by MD 09/27/23  Yes Regalado, Belkys A, MD  LORazepam  (ATIVAN ) 0.5 MG tablet Take 1 tablet (0.5 mg total) by mouth 2 (two) times daily as needed. for anxiety 10/24/23  Yes Sheikh, Omair Latif, DO  montelukast  (SINGULAIR ) 10 MG tablet Take 1 tablet (10 mg total) by mouth daily. 04/19/23  Yes Shelah Lamar RAMAN, MD  Nutritional Supplements (FEEDING SUPPLEMENT, NEPRO CARB STEADY,) LIQD Take 237 mLs by mouth 2 (two) times daily between meals. 10/24/23  Yes Sheikh, Omair Latif, DO  sodium bicarbonate  650 MG tablet Take 1 tablet (650 mg total) by mouth 2 (two) times daily. 09/26/23  Yes Regalado, Belkys A, MD  torsemide  40 MG TABS Take 40 mg by mouth 2 (two) times daily. Patient taking differently: Take 20 mg by mouth daily. 09/26/23  Yes Regalado, Belkys A, MD  vitamin B-12 (CYANOCOBALAMIN ) 1000 MCG tablet Take 1,000 mcg by mouth daily.   Yes [provider]  Vitamin D ,  Cholecalciferol , 25 MCG (1000 UT) TABS Take 1,000 Units by mouth daily. 05/30/23  Yes Webb, Padonda B, FNP  Spacer/Aero-Holding Chambers (AEROCHAMBER PLUS) inhaler Use as instructed 05/26/16   Van Knee, MD    Physical Exam: Vitals:   12/02/23 0600 12/02/23 0615 12/02/23 0656 12/02/23 0715  BP: (!) 166/72 (!) 180/69  (!) 168/69  Pulse: 74 73  75  Resp: (!) 23 (!) 25  (!) 21  Temp:   99.1 F (37.3 C)   TempSrc:   Oral   SpO2: 100% 100%  100%  Weight:      Height:        Constitutional: NAD, calm, comfortable, on room air, family at the bedside Eyes: PERRL, lids and conjunctivae normal ENMT: Mucous membranes are moist. Posterior pharynx clear of any exudate or lesions.Normal dentition.  Neck: normal, supple, no masses, no thyromegaly Respiratory: Rales present on the bases.  No wheezing or rhonchi. Cardiovascular: Regular rate and rhythm, no murmurs / rubs / gallops.  Bilateral 3+ pitting edema positive  abdomen: no tenderness, no masses palpated. No hepatosplenomegaly. Bowel sounds positive.  Musculoskeletal: no clubbing / cyanosis. No joint deformity upper and lower extremities. Good ROM, no contractures. Normal muscle tone.  Skin: no rashes, lesions, ulcers. No induration Neurologic: CN 2-12 grossly intact. Sensation intact, DTR normal. Strength 5/5 in all 4.  Psychiatric: Normal judgment and insight. Alert and oriented x 3. Normal mood.    Labs on Admission: I have personally reviewed following labs and imaging studies  CBC: Recent Labs  Lab 11/29/23 2354 12/02/23 0230  WBC 5.8 6.0  HGB 7.5* 7.4*  HCT 24.9* 23.7*  MCV 100.0 97.1  PLT 211 215   Basic Metabolic Panel: Recent Labs  Lab 11/29/23 2354 12/02/23 0230  NA 140 138  K 5.0 4.8  CL 109 108  CO2 18* 17*  GLUCOSE 123* 117*  BUN 75* 75*  CREATININE 5.24* 5.01*  CALCIUM  8.4* 8.7*   GFR: Estimated Creatinine Clearance: 6.8 mL/min (A) (by C-G formula based on SCr of 5.01  mg/dL (H)). Liver Function  Tests: No results for input(s): AST, ALT, ALKPHOS, BILITOT, PROT, ALBUMIN  in the last 168 hours. No results for input(s): LIPASE, AMYLASE in the last 168 hours. No results for input(s): AMMONIA in the last 168 hours. Coagulation Profile: Recent Labs  Lab 11/29/23 2354  INR 1.0   Cardiac Enzymes: No results for input(s): CKTOTAL, CKMB, CKMBINDEX, TROPONINI in the last 168 hours. BNP (last 3 results) Recent Labs    09/22/23 0036  PROBNP >35,000.0*   HbA1C: No results for input(s): HGBA1C in the last 72 hours. CBG: No results for input(s): GLUCAP in the last 168 hours. Lipid Profile: No results for input(s): CHOL, HDL, LDLCALC, TRIG, CHOLHDL, LDLDIRECT in the last 72 hours. Thyroid  Function Tests: No results for input(s): TSH, T4TOTAL, FREET4, T3FREE, THYROIDAB in the last 72 hours. Anemia Panel: No results for input(s): VITAMINB12, FOLATE, FERRITIN, TIBC, IRON, RETICCTPCT in the last 72 hours. Urine analysis:    Component Value Date/Time   COLORURINE YELLOW 10/14/2023 1720   APPEARANCEUR CLEAR 10/14/2023 1720   LABSPEC 1.013 10/14/2023 1720   PHURINE 6.0 10/14/2023 1720   GLUCOSEU 50 (A) 10/14/2023 1720   GLUCOSEU NEGATIVE 01/16/2023 1238   HGBUR NEGATIVE 10/14/2023 1720   BILIRUBINUR NEGATIVE 10/14/2023 1720   KETONESUR NEGATIVE 10/14/2023 1720   PROTEINUR >=300 (A) 10/14/2023 1720   UROBILINOGEN 0.2 01/16/2023 1238   NITRITE NEGATIVE 10/14/2023 1720   LEUKOCYTESUR NEGATIVE 10/14/2023 1720    Radiological Exams on Admission: DG Chest Portable 1 View Result Date: 12/02/2023 EXAM: 1 VIEW XRAY OF THE CHEST 12/02/2023 03:03:00 AM COMPARISON: 11/30/2023 CLINICAL HISTORY: Chest pain, shortness of breath with feet and ankles swollen for 2 days. FINDINGS: LUNGS AND PLEURA: Mild interstitial edema. Possible small bilateral pleural effusions. No pneumothorax. HEART AND MEDIASTINUM: Cardiomegaly. BONES AND SOFT  TISSUES: No acute osseous abnormality. IMPRESSION: 1. Cardiomegaly with mild interstitial edema. 2. Possible small bilateral pleural effusions. Electronically signed by: Pinkie Pebbles MD 12/02/2023 03:05 AM EDT RP Workstation: [id number]    EKG: Independently reviewed.  Sinus rhythm with occasional premature ventricular complexes..  Assessment/Plan  Fluid overload in the setting of acute on chronic combined systolic and diastolic CHF and underlying CKD stage V: - Presented with worsening shortness of breath and leg edema.  Chest x-ray shows pulmonary edema, BNP elevated -Lasix  80 IV given in ED. -Patient is currently maintaining oxygen saturation on room air.   -Reviewed echo from 10/19/2023 showed EF of 45 to 50% with moderate calcification of aortic valve - Patient refused dialysis which I agree.  Discussed with nephrology Dr. Dock start Lasix  80 twice daily, monitor renal function closely-if kidney function continues to get worse-call nephrology. - Will consult palliative care as well to discuss goals of care - Monitor electrolytes.  - Strict INO's and daily weight.  Hypertensive urgency: -Likely in the setting of fluid overload-.  Will continue home medications Coreg , hydralazine , amlodipine , clonidine  -Monitor blood pressure closely  Elevated troponin:  -Likely in the setting of fluid overload.  Patient denies any chest pain.  Continue to monitor on telemetry  Anemia of CKD: - Baseline hemoglobin between 8-10.  Hemoglobin upon arrival 7.4.  Continue to monitor closely.  Transfuse if hemoglobin less than 7.  Continue iron supplements.  Moderate persistent asthma: - Continue home inhalers.  Currently not requiring oxygen.  Depression/anxiety/insomnia: Continue home medications Celexa  and Ativan  as needed   DVT prophylaxis: Heparin  Code Status: Full code-confirmed with the patient and the family Family Communication: Patient's daughter and son-in-law  present at bedside.   Plan of care discussed with patient in length and she verbalized understanding and agreed with it. Disposition Plan: Home Consults called: Palliative care Admission status: Inpatient   Velna JONELLE Skeeter MD [facility]  If 7PM-7AM, please contact night-coverage [email or web address]  12/02/2023, 7:53 AM

## 2023-12-02 NOTE — Consult Note (Signed)
 Consultation Note Date: 12/02/2023   Patient Name: [name]  DOB: 03/02/1934  MRN: [id number]  Age / Sex: 89 y.o., female  PCP: Charlett Apolinar POUR, MD Referring Physician: Vernon Velna SAUNDERS, MD  Reason for Consultation: Establishing goals of care  HPI/Patient Profile: 89 y.o. female  with past medical history of CKD stage V, hypertension, asthma, anemia of chronic disease, chronic combined systolic and diastolic CHF, anxiety, GERD, hypertension, hyperlipidemia  admitted on 12/02/2023 with dyspnea and bilateral edema in lower extremities for 2 to 3 days.   In the ED, patient was found to have mild pulmonary edema on chest x-ray, BNP of 905, troponin of 136.  She was admitted for management of fluid overload secondary to acute on chronic CHF and underlying CKD 5.  Patient has had 4 hospitalizations in the past 6 months as well as 1 ED visit.  PMT has been consulted to assist with goals of care conversation.  Clinical Assessment and Goals of Care:  I have reviewed medical records including EPIC notes, labs and imaging, assessed the patient and then at the bedside with Harland Channel, and [place] to discuss diagnosis prognosis, GOC, EOL wishes, disposition and options.  I introduced Palliative Medicine as specialized medical care for people living with serious illness. It focuses on providing relief from the symptoms and stress of a serious illness. The goal is to improve quality of life for both the patient and the family.  We discussed a brief life review of the patient and then focused on their current illness.  The natural disease trajectory and expectations at EOL were discussed.  I attempted to elicit values and goals of care important to the patient.    Medical History Review and Understanding:  We discussed patient's acute illness in the context of their chronic comorbidities.  I provided education on the relationship between heart failure and kidney  disease, as well as risks associated with recurrent hospitalizations.  Patient's family understand the severity of patient's illness.  Social History: Per conversation with PMT in March of 2025, Kiya is from [place] . She shares that she was born in [facility] [place] . Kairee is not married. She has 5 living children, 13 grandchildren, and numerous great-grandchildren. Nayeli is the mother of her whole [employer] as well as the Psychologist, occupational.   Functional and Nutritional State: Patient and family shared that she has been up-and-down since last conversation with PMT.  Explored further and patient reports she has been doing more down than up.  Palliative Symptoms: Headaches and itching on her neck likely due to recent shingles  Advance Directives: A detailed discussion regarding advanced directives was had.  Offered to assist with completion during this admission if patient is interested.   Code Status: Concepts specific to code status, artifical feeding and hydration, and rehospitalization were considered and discussed.   Discussion: Patient is not very talkative today.  She cannot remember what medication was tried for her headaches.  We discussed that this may relate to her hypertension as well.  She is trying calamine lotion for her neck itching.  Family has some concerns about whether patient is compliant with her medications.  We discussed the current care plan including Lasix  80 mg IV twice daily and conversations that have occurred with the medical team and other specialties including nephrology.  Patient and family appreciative of the conversation.  They are open to another visit for ongoing goals of care discussions and will pass along PMT contact  information to patient's granddaughter.   Discussed the importance of continued conversation with family and the medical providers regarding overall plan of care and treatment options, ensuring decisions are  within the context of the patient's values and GOCs.   Questions and concerns were addressed.  The family was encouraged to call with questions or concerns.  PMT will continue to support holistically.   SUMMARY OF RECOMMENDATIONS   - Continue full code/full scope treatment - Provided education on patient's comorbidities and high risk for further decline with recurrent hospitalization.  She is not very talkative today - Spiritual care consult placed today for support - Recommended completion of advance directives and provided education on the importance of anticipatory care planning - Ongoing goals of care discussions as patient is willing and able -  PMT to continue to follow and support   Prognosis:  Poor long-term prognosis given acute on chronic illness and several recurrent hospitalizations  Discharge Planning: To Be Determined      Primary Diagnoses: Present on Admission:  Fluid overload   Physical Exam Vitals and nursing note reviewed.  Constitutional:      General: She is not in acute distress.    Appearance: She is ill-appearing.  Pulmonary:     Effort: Pulmonary effort is normal.  Neurological:     Mental Status: She is alert.  Psychiatric:        Mood and Affect: Affect is flat.        Behavior: Behavior normal.     Vital Signs: BP (!) 168/69   Pulse 75   Temp 99.1 F (37.3 C) (Oral)   Resp (!) 21   Ht 5' 2 (1.575 m)   Wt 60.2 kg   SpO2 100%   BMI 24.27 kg/m  Pain Scale: 0-10   Pain Score: 6    SpO2: SpO2: 100 % O2 Device:SpO2: 100 % O2 Flow Rate: .     MDM: High   Jaelen Soth SHAUNNA Fell, PA-C  Palliative Medicine Team Team phone # [phone]  Thank you for allowing the Palliative Medicine Team to assist in the care of this patient. Please utilize secure chat with additional questions, if there is no response within 30 minutes please call the above phone number.  Palliative Medicine Team providers are available by phone from 7am to 7pm  daily and can be reached through the team cell phone.  Should this patient require assistance outside of these hours, please call the patient's attending physician.

## 2023-12-02 NOTE — ED Notes (Signed)
Pt used bedside commode. Tolerated well.

## 2023-12-02 NOTE — ED Triage Notes (Signed)
 The pt is c/o shortness of breath for 2 days with feet and legs swollen

## 2023-12-03 DIAGNOSIS — Z515 Encounter for palliative care: Secondary | ICD-10-CM | POA: Diagnosis not present

## 2023-12-03 DIAGNOSIS — I509 Heart failure, unspecified: Secondary | ICD-10-CM | POA: Diagnosis not present

## 2023-12-03 DIAGNOSIS — Z7189 Other specified counseling: Secondary | ICD-10-CM | POA: Diagnosis not present

## 2023-12-03 DIAGNOSIS — E8779 Other fluid overload: Secondary | ICD-10-CM | POA: Diagnosis not present

## 2023-12-03 LAB — CBC
HCT: 21.3 % — ABNORMAL LOW (ref 36.0–46.0)
Hemoglobin: 6.8 g/dL — CL (ref 12.0–15.0)
MCH: 30.4 pg (ref 26.0–34.0)
MCHC: 31.9 g/dL (ref 30.0–36.0)
MCV: 95.1 fL (ref 80.0–100.0)
Platelets: 220 K/uL (ref 150–400)
RBC: 2.24 MIL/uL — ABNORMAL LOW (ref 3.87–5.11)
RDW: 14.6 % (ref 11.5–15.5)
WBC: 4 K/uL (ref 4.0–10.5)
nRBC: 0 % (ref 0.0–0.2)

## 2023-12-03 LAB — IRON AND TIBC
Iron: 67 ug/dL (ref 28–170)
Saturation Ratios: 39 % — ABNORMAL HIGH (ref 10.4–31.8)
TIBC: 174 ug/dL — ABNORMAL LOW (ref 250–450)
UIBC: 107 ug/dL

## 2023-12-03 LAB — BASIC METABOLIC PANEL WITH GFR
Anion gap: 11 (ref 5–15)
BUN: 77 mg/dL — ABNORMAL HIGH (ref 8–23)
CO2: 19 mmol/L — ABNORMAL LOW (ref 22–32)
Calcium: 8.6 mg/dL — ABNORMAL LOW (ref 8.9–10.3)
Chloride: 107 mmol/L (ref 98–111)
Creatinine, Ser: 5.24 mg/dL — ABNORMAL HIGH (ref 0.44–1.00)
GFR, Estimated: 7 mL/min — ABNORMAL LOW (ref >60)
Glucose, Bld: 107 mg/dL — ABNORMAL HIGH (ref 70–99)
Potassium: 5 mmol/L (ref 3.5–5.1)
Sodium: 137 mmol/L (ref 135–145)

## 2023-12-03 LAB — VITAMIN B12: Vitamin B-12: 2283 pg/mL — ABNORMAL HIGH (ref 180–914)

## 2023-12-03 LAB — RETICULOCYTES
Immature Retic Fract: 7.3 % (ref 2.3–15.9)
RBC.: 2.53 MIL/uL — ABNORMAL LOW (ref 3.87–5.11)
Retic Count, Absolute: 38.5 K/uL (ref 19.0–186.0)
Retic Ct Pct: 1.5 % (ref 0.4–3.1)

## 2023-12-03 LAB — MAGNESIUM: Magnesium: 2 mg/dL (ref 1.7–2.4)

## 2023-12-03 LAB — FOLATE: Folate: 10.8 ng/mL (ref >5.9)

## 2023-12-03 LAB — PREPARE RBC (CROSSMATCH)

## 2023-12-03 LAB — FERRITIN: Ferritin: 371 ng/mL — ABNORMAL HIGH (ref 11–307)

## 2023-12-03 MED ORDER — SODIUM CHLORIDE 0.9% IV SOLUTION: Freq: Once | INTRAVENOUS | Status: AC

## 2023-12-03 MED ORDER — CAMPHOR-MENTHOL 0.5-0.5 % EX LOTN
TOPICAL_LOTION | CUTANEOUS | Status: DC | PRN
  Administered 2023-12-03: 1 via TOPICAL
  Filled 2023-12-03: qty 222

## 2023-12-03 NOTE — Progress Notes (Signed)
 Daily Progress Note   Patient Name: [name]       Date: 12/03/2023 DOB: 1934-02-22  Age: 89 y.o. MRN#: [id number] Attending Physician: [name], MD Primary Care Physician: Charlett Apolinar POUR, MD Admit Date: 12/02/2023  Reason for Consultation/Follow-up: Establishing goals of care  Subjective: Medical records reviewed including progress notes, labs and imaging. Called patient's granddaughter [name] to provide update on my conversation with patient yesterday. She shares that they have had more difficulty caring for [name] and are considering a LTC facility. I explained that medicare does not cover this and they they will likely need to start Medicaid application process. Explored her interest in HCPOA completion and provided education that patient would need to understand the documents for a notary to finalize. She understands. She would like to meet this afternoon around 1-2pm for further GOC discussion.  Met at the bedside for scheduled family meeting and patient assessed. She is in good spirits, reports feeling better. Granddaughter was not yet present on first two attempts but then we were able to meet. Created space and opportunity for patient and family's thoughts and feelings on patient's current illness. Emotional support and therapeutic listening was provided.   [name] is very stressed about how to continue caring for patient safely.  She has had some instances such as waking up to a burning smell on the stove that made her consider long-term placement despite not wanting to consider in the past.  I shared that I would reach out to [facility] regarding options.  She would not be able to afford LTC or private caregivers out-of-pocket.  We discussed hospice philosophy and what home support would look like.  She tells me that patient is not  ready for this, though very much wants to minimize her medications.  She seems to want things both ways.  [name] feels that patient would be able to understand and finalize her wishes on HCPOA documentation.  She has more issues with memory rather than understanding.  I shared that I would consult spiritual care for assistance and she is appreciative.  She also would like me to note that patient has several close friends visit and claim to be family members, but they are not to be given medical information without speaking to either her or patient's daughters.  Requested a medical update to the primary attending.  An extensive conversation was had, covering concepts specific to code status, artifical feeding and hydration, continued IV antibiotics and rehospitalization.  MOST form was introduced and recommended.  Patient's granddaughter is agreeable to DNR, though patient does not like to talk about these things  and other family members are more aligned with full code.  Questions and concerns addressed. PMT will continue to support holistically.   Length of Stay: 1   Physical Exam Vitals and nursing note reviewed.  Constitutional:      General: She is not in acute distress. HENT:     Head: Normocephalic and atraumatic.  Pulmonary:     Effort: Pulmonary effort is normal.  Neurological:     Mental Status: She is alert.  Psychiatric:        Mood and Affect: Mood normal.        Behavior: Behavior normal.             Vital Signs: BP (!) 166/60 (BP Location: Left Arm)   Pulse 71   Temp 97.8 F (36.6 C) (Oral)   Resp 17   Ht 5' 3 (1.6 m)   Wt 61.5 kg   SpO2 100%   BMI 24.02 kg/m  SpO2: SpO2: 100 % O2 Device: O2 Device: Room Air O2 Flow Rate:        Palliative Assessment/Data:   Palliative Care Assessment & Plan   Patient Profile: 89 y.o. female  with past medical history of CKD stage V, hypertension, asthma, anemia of chronic disease, chronic combined systolic and diastolic  CHF, anxiety, GERD, hypertension, hyperlipidemia  admitted on 12/02/2023 with dyspnea and bilateral edema in lower extremities for 2 to 3 days.    In the ED, patient was found to have mild pulmonary edema on chest x-ray, BNP of 905, troponin of 136.  She was admitted for management of fluid overload secondary to acute on chronic CHF and underlying CKD 5.   Patient has had 4 hospitalizations in the past 6 months as well as 1 ED visit.  PMT has been consulted to assist with goals of care conversation.  Assessment: Goals of care conversation Acute on chronic CHF CKD stage V Cognitive impairment  Recommendations/Plan: Continue full code/full scope treatment for now Patient is hesitant to discuss potential for poor outcome; granddaughter is reasonable and will attempt to encourage and discuss with her Spiritual care consult for assistance with advance directives TOC consulted for discussion of long-term care options and insurance Psychosocial and emotional support provided Ongoing goals of care discussions PMT will continue to follow and support    Prognosis:  Poor long-term prognosis given acute on chronic illness and several recurrent hospitalizations  Discharge Planning: To Be Determined  Care plan was discussed with patient, patient's granddaughter, TOC, MD   Total time: I spent 50 minutes in the care of the patient today in the above activities and documenting the encounter.          Angela Pote P Ahri Olson, PA-C  Palliative Medicine Team Team phone # [phone]  Thank you for allowing the Palliative Medicine Team to assist in the care of this patient. Please utilize secure chat with additional questions, if there is no response within 30 minutes please call the above phone number.  Palliative Medicine Team providers are available by phone from 7am to 7pm daily and can be reached through the team cell phone.  Should this patient require assistance outside of these hours,  please call the patient's attending physician.

## 2023-12-03 NOTE — TOC Initial Note (Signed)
 Transition of Care [facility]) - Initial/Assessment Note    Patient Details  Name: [name] MRN: [id number] Date of Birth: [birth year]  [facility] CM/SW Contact:    Waddell Barnie Rama, RN Phone Number: 12/03/2023, 11:31 AM  Clinical Narrative:                 From home with grand daughter, has PCP and insurance on file, states has [facility] services in place at this time with [facility], NCM called Jon, she states patient is active with [facility], NCM left vm for [place] for return call to confirm , she has bsc and a cane at home.  States Harland will transport them home at [employer] and family is support system, states gets medications from [facility].  Pta self ambulatory with cane.       Expected Discharge Plan: Home w Home Health Services Barriers to Discharge: Continued Medical Work up   Patient Goals and CMS Choice Patient states their goals for this hospitalization and ongoing recovery are:: return home with grand daughter CMS Medicare.gov Compare Post Acute Care list provided to:: Patient Represenative (must comment) Choice offered to / list presented to : Adult Children      Expected Discharge Plan and Services In-house Referral: NA Discharge Planning Services: CM Consult Post Acute Care Choice: Home Health, Resumption of Svcs/PTA Provider Living arrangements for the past 2 months: Single Family Home                 DME Arranged: N/A DME Agency: NA       HH Arranged: RN, PT, Disease Management HH Agency: [facility] Date HH Agency Contacted: 12/03/23   Representative spoke with at [facility] Agency: [place]  Prior Living Arrangements/Services Living arrangements for the past 2 months: Single Family Home Lives with:: Adult Children Patient language and need for interpreter reviewed:: Yes Do you feel safe going back to the place where you live?: Yes      Need for Family Participation in Patient Care: Yes (Comment) Care giver support system in  place?: Yes (comment) Current home services: DME (bsc, cane) Criminal Activity/Legal Involvement Pertinent to Current Situation/Hospitalization: No - Comment as needed  Activities of Daily Living   ADL Screening (condition at time of admission) Independently performs ADLs?: Yes (appropriate for developmental age) Is the patient deaf or have difficulty hearing?: No Does the patient have difficulty seeing, even when wearing glasses/contacts?: No Does the patient have difficulty concentrating, remembering, or making decisions?: No  Permission Sought/Granted Permission sought to share information with : Case Manager Permission granted to share information with : Yes, Verbal Permission Granted  Share Information with NAME: [name] ( grand daughter)  Permission granted to share info w AGENCY: HH        Emotional Assessment Appearance:: Appears stated age Attitude/Demeanor/Rapport: Engaged Affect (typically observed): Appropriate Orientation: : Oriented to Self, Oriented to Place, Oriented to  Time, Oriented to Situation Alcohol  / Substance Use: Not Applicable Psych Involvement: No (comment)  Admission diagnosis:  SOB (shortness of breath) [R06.02] Fluid overload [E87.70] Acute on chronic congestive heart failure, unspecified heart failure type (HCC) [I50.9] Patient Active Problem List   Diagnosis Date Noted   Fluid overload 12/02/2023   Hypothermia 10/15/2023   Sepsis (HCC) 10/15/2023   Adverse effect of valaciclovir 10/14/2023   Acute encephalopathy 10/14/2023   Acute exacerbation of CHF (congestive heart failure) (HCC) 09/22/2023   Acute kidney injury superimposed on stage 5 chronic kidney disease, not on  chronic dialysis (HCC) 08/08/2023   Hypertensive nephrosclerosis 08/08/2023   Anemia of chronic kidney failure, stage 5 (HCC) 08/08/2023   Metabolic acidosis 08/08/2023   Acute exacerbation of moderate persistent extrinsic asthma 08/08/2023   Acute on chronic heart failure  with preserved ejection fraction (HFpEF) (HCC) 08/08/2023   AKI (acute kidney injury) (HCC) 08/08/2023   CAP (community acquired pneumonia) 10/31/2022   Hypertensive urgency 10/31/2022   Moderate persistent asthma 10/31/2022   Hypomagnesemia 10/31/2022   CKD (chronic kidney disease), stage IV (HCC) 10/31/2022   Pulmonary hypertension, unspecified (HCC) 12/06/2021   Chronic cough 10/18/2021   Chronic rhinitis 11/24/2019   Nausea 11/03/2018   Elevated troponin 11/03/2018   Hyponatremia 11/03/2018   Hypertensive heart disease with hypertensive chronic kidney disease 11/30/2017   Fasting hyperglycemia 11/30/2017   Renal cyst 03/27/2016   CKD (chronic kidney disease) stage 3, GFR 30-59 ml/min (HCC) 08/23/2015   Subjective hearing change 06/29/2014   Resistant hypertension 06/29/2014   Lumbago 06/15/2014   Pre-diabetes vs early DM  06/15/2014   Dyspnea 04/27/2014   Asthma, chronic 04/13/2014   Elevated uric acid in blood 04/13/2014   Essential hypertension 03/27/2014   History of DVT (deep vein thrombosis)    Palpitations 01/05/2014   Anemia of chronic disease January 12, 2014   Death of family member 09/16/13   Leg edema 06/20/2013   Bereavement due to life event 04/21/2013   Other dysphagia 02/11/2013   Colon cancer screening 02/11/2013   Anxiety state 01/20/2013   Leg cramps 01/20/2013   Back pain 12/05/2012   Family hx of colon cancer 10/21/2012   Family hx of lung cancer 10/21/2012   Family hx-breast malignancy 10/21/2012   Renal insufficiency creatinine 1.3 10/21/2012   Anemia, chronic disease 10/21/2012   GERD (gastroesophageal reflux disease) 09/08/2012   HTN (hypertension) 09/08/2012   Hyperlipidemia 09/08/2012   Varicose veins of leg with complications 12/05/2010   PCP:  Charlett Apolinar POUR, MD Pharmacy:   [facility], [place] - [address] [place] [address] Phone: [phone] Fax:  [phone]  [facility] [address] [place] [address] Phone: [phone] Fax: [phone]     Social Drivers of Health (SDOH) Social History: SDOH Screenings   Food Insecurity: No Food Insecurity (12/02/2023)  Housing: Low Risk  (12/02/2023)  Transportation Needs: No Transportation Needs (12/02/2023)  Utilities: Not At Risk (12/02/2023)  Alcohol  Screen: Low Risk  (09/21/2023)  Depression (PHQ2-9): Low Risk  (09/21/2023)  Financial Resource Strain: Low Risk  (09/21/2023)  Physical Activity: Sufficiently Active (09/21/2023)  Social Connections: Moderately Isolated (12/02/2023)  Stress: No Stress Concern Present (09/21/2023)  Tobacco Use: Low Risk  (12/02/2023)  Health Literacy: Adequate Health Literacy (09/21/2023)   SDOH Interventions:     Readmission Risk Interventions    12/03/2023   11:27 AM 10/24/2023    2:20 PM 10/16/2023    3:51 PM  Readmission Risk Prevention Plan  Transportation Screening Complete Complete Complete  Medication Review Oceanographer) Complete Complete Complete  PCP or Specialist appointment within 3-5 days of discharge Complete Complete Complete  HRI or Home Care Consult Complete Complete Complete  SW Recovery Care/Counseling Consult  Complete Complete  Palliative Care Screening Not Applicable  Not Applicable  Skilled Nursing Facility Not Applicable Complete Complete

## 2023-12-03 NOTE — Progress Notes (Signed)
 Mobility Specialist Progress Note:    12/03/23 1030  Mobility  Activity Ambulated with assistance in hallway  Level of Assistance Contact guard assist, steadying assist  Assistive Device Cane  Distance Ambulated (ft) 75 ft  Activity Response Tolerated fair  Mobility Referral Yes  Mobility visit 1 Mobility  Mobility Specialist Start Time (ACUTE ONLY) 1030  Mobility Specialist Stop Time (ACUTE ONLY) 1100  Mobility Specialist Time Calculation (min) (ACUTE ONLY) 30 min   Pt pleasant and agreeable to session. C/o of slight fatigue and lightheadness; hopefully will improved with unit of blood being given this afternoon. Pt otherwise moves well w/ shorter steps using SPC. Returned pt in bed as requested w/ RN and phlebotomy.   Venetia Keel Mobility Specialist Please Neurosurgeon or Rehab Office at [phone]

## 2023-12-03 NOTE — Progress Notes (Signed)
 PROGRESS NOTE    [name]  FMW:[id number] DOB: 05/17/1933 DOA: 12/02/2023 PCP: Charlett Apolinar POUR, MD  89/F w/ chronic combined CHF, CKD 5, anxiety, GERD, hypertension, dyslipidemia presented to the ED with worsening dyspnea and edema for 2 to 3 days. -Reports compliance with diuretics - In the ER hypertensive, creatinine 5.01, BUN 75, bicarb 17, hemoglobin 7.4, BNP 95, chest x-ray with cardiomegaly and interstitial edema, small effusions  Subjective: -feels better, breathing is improving  Assessment and Plan:  Volume overload Acute on chronic combined systolic and diastolic CHF CKD stage V: - Continue Lasix  80 Mg twice daily, 870 mL negative so far -Monitor urine output -GDMT limited by CKD 5 -Palliative consulted, patient wishes for full code and full scope -Unclear if she would be considered a dialysis candidate, will request nephrology input, no acute indications for dialysis   Hypertensive urgency: -continue Coreg , hydralazine , amlodipine , clonidine  -Monitor blood pressure closely   Elevated troponin:  -Likely in the setting of fluid overload.  Patient denies any chest pain.  Continue to monitor on telemetry   Anemia of CKD: - Baseline hemoglobin between 8-10.   - Hemoglobin is trended down to 6.8, transfusing PRBC today, check anemia panel   Moderate persistent asthma: - Continue home inhalers.    Depression/anxiety/insomnia:  -Continue Celexa  and Ativan  as needed     DVT prophylaxis: Heparin  Code Status: Full code Family Communication: Discussed with patient in detail, no family at bedside Disposition Plan: Home  Consultants:    Procedures:   Antimicrobials:    Objective: Vitals:   12/02/23 2032 12/03/23 0008 12/03/23 0404 12/03/23 0800  BP:  (!) 142/61 (!) 150/64 (!) 166/60  Pulse:  66 68 71  Resp: 18 18 17 17   Temp:  97.6 F (36.4 C) 97.6 F (36.4 C) 97.8 F (36.6 C)  TempSrc:  Oral Oral Oral  SpO2:  100% 99% 100%  Weight:   61.5 kg    Height:        Intake/Output Summary (Last 24 hours) at 12/03/2023 1021 Last data filed at 12/03/2023 1006 Gross per 24 hour  Intake 480 ml  Output 1150 ml  Net -670 ml   Filed Weights   12/02/23 0212 12/02/23 1237 12/03/23 0404  Weight: 60.2 kg 62.3 kg 61.5 kg    Examination:  General exam: Appears calm and comfortable  HEENT: Positive JVD Respiratory system: Few basilar rales Cardiovascular system: S1 & S2 heard, RRR.  Abd: nondistended, soft and nontender.Normal bowel sounds heard. Central nervous system: Alert and oriented. No focal neurological deficits. Extremities: 2+ edema Skin: No rashes Psychiatry:  Mood & affect appropriate.     Data Reviewed:   CBC: Recent Labs  Lab 11/29/23 2354 12/02/23 0230 12/02/23 0752 12/03/23 0320  WBC 5.8 6.0 5.3 4.0  HGB 7.5* 7.4* 7.7* 6.8*  HCT 24.9* 23.7* 24.6* 21.3*  MCV 100.0 97.1 96.5 95.1  PLT 211 215 240 220   Basic Metabolic Panel: Recent Labs  Lab 11/29/23 2354 12/02/23 0230 12/02/23 0752 12/03/23 0320  NA 140 138  --  137  K 5.0 4.8  --  5.0  CL 109 108  --  107  CO2 18* 17*  --  19*  GLUCOSE 123* 117*  --  107*  BUN 75* 75*  --  77*  CREATININE 5.24* 5.01* 5.25* 5.24*  CALCIUM  8.4* 8.7*  --  8.6*  MG  --   --  2.1 2.0  PHOS  --   --  5.8*  --  GFR: Estimated Creatinine Clearance: 6.3 mL/min (A) (by C-G formula based on SCr of 5.24 mg/dL (H)). Liver Function Tests: No results for input(s): AST, ALT, ALKPHOS, BILITOT, PROT, ALBUMIN  in the last 168 hours. No results for input(s): LIPASE, AMYLASE in the last 168 hours. No results for input(s): AMMONIA in the last 168 hours. Coagulation Profile: Recent Labs  Lab 11/29/23 2354  INR 1.0   Cardiac Enzymes: No results for input(s): CKTOTAL, CKMB, CKMBINDEX, TROPONINI in the last 168 hours. BNP (last 3 results) Recent Labs    09/22/23 0036  PROBNP >35,000.0*   HbA1C: No results for input(s): HGBA1C in the last 72  hours. CBG: No results for input(s): GLUCAP in the last 168 hours. Lipid Profile: No results for input(s): CHOL, HDL, LDLCALC, TRIG, CHOLHDL, LDLDIRECT in the last 72 hours. Thyroid  Function Tests: Recent Labs    12/02/23 0753  TSH 3.573   Anemia Panel: No results for input(s): VITAMINB12, FOLATE, FERRITIN, TIBC, IRON, RETICCTPCT in the last 72 hours. Urine analysis:    Component Value Date/Time   COLORURINE YELLOW 10/14/2023 1720   APPEARANCEUR CLEAR 10/14/2023 1720   LABSPEC 1.013 10/14/2023 1720   PHURINE 6.0 10/14/2023 1720   GLUCOSEU 50 (A) 10/14/2023 1720   GLUCOSEU NEGATIVE 01/16/2023 1238   HGBUR NEGATIVE 10/14/2023 1720   BILIRUBINUR NEGATIVE 10/14/2023 1720   KETONESUR NEGATIVE 10/14/2023 1720   PROTEINUR >=300 (A) 10/14/2023 1720   UROBILINOGEN 0.2 01/16/2023 1238   NITRITE NEGATIVE 10/14/2023 1720   LEUKOCYTESUR NEGATIVE 10/14/2023 1720   Sepsis Labs: @LABRCNTIP (procalcitonin:4,lacticidven:4)  )No results found for this or any previous visit (from the past 240 hours).   Radiology Studies: DG Chest Portable 1 View Result Date: 12/02/2023 EXAM: 1 VIEW XRAY OF THE CHEST 12/02/2023 03:03:00 AM COMPARISON: 11/30/2023 CLINICAL HISTORY: Chest pain, shortness of breath with feet and ankles swollen for 2 days. FINDINGS: LUNGS AND PLEURA: Mild interstitial edema. Possible small bilateral pleural effusions. No pneumothorax. HEART AND MEDIASTINUM: Cardiomegaly. BONES AND SOFT TISSUES: No acute osseous abnormality. IMPRESSION: 1. Cardiomegaly with mild interstitial edema. 2. Possible small bilateral pleural effusions. Electronically signed by: Pinkie Pebbles MD 12/02/2023 03:05 AM EDT RP Workstation: [id number]     Scheduled Meds:  sodium chloride    Intravenous Once   amLODipine   10 mg Oral Daily   budesonide -glycopyrrolate -formoterol   2 puff Inhalation BID   carvedilol   12.5 mg Oral BID   citalopram   10 mg Oral Daily   cyanocobalamin   1,000  mcg Oral Daily   ferrous sulfate   325 mg Oral Q breakfast   furosemide   80 mg Intravenous BID   hydrALAZINE   100 mg Oral TID   montelukast   10 mg Oral Daily   sodium bicarbonate   650 mg Oral BID   Continuous Infusions:   LOS: 1 day    Time spent:    Sigurd Pac, MD [facility]   12/03/2023, 10:21 AM

## 2023-12-03 NOTE — Plan of Care (Signed)
  Problem: Clinical Measurements: Goal: Will remain free from infection Outcome: Progressing Hypothermic today, family stated this happened during last hospitalization in June as well and had to receive bair hugger heating blanket, then as well.  Problem: Clinical Measurements: Goal: Ability to maintain clinical measurements within normal limits will improve Outcome: Progressing  Received 1 unit of RBC's today

## 2023-12-03 NOTE — TOC Progression Note (Signed)
 Transition of Care [facility]) - Progression Note    Patient Details  Name: [name] MRN: [id number] Date of Birth: 1933/11/13  [facility] CM/SW Contact  Luise JAYSON Pan, [place] Phone Number: 12/03/2023, 5:36 PM  Clinical Narrative:   CSW spoke with [facility] about LTC SNF information. CSW explained that [place] medicaid is a payor source for the medical portion of LTC. CSW further explained that patient would have to forfeit her SSI check the facility to pay for room and board. [name] expressed her understanding verbally and stated patient does not have medicaid. CSW to request medicaid screening from financial counseling.   CSW informed Harland that we will wait for PT/OT to evaluate her grandmother and see what recommendations they have before proceeding with discharge planning. Harland is agreeable with PT/OT evaluating her grandmother. MD notified to place order. Per chart review, patient was previously at [facility] confirmed and stated patient may be in copay days. CSW will follow up with facility. [name] provided CSW with her email to send information to [email or web address].  [name] requested CSW to inform staff that she does not want too many outside people involved in patient care. She requested that she be called with any updates and that any visitors exit the room when updates are to be shared. CSW informed staff.   TOC will continue to follow.    Expected Discharge Plan: Home w Home Health Services Barriers to Discharge: Continued Medical Work up  Expected Discharge Plan and Services In-house Referral: NA Discharge Planning Services: CM Consult Post Acute Care Choice: Home Health, Resumption of Svcs/PTA Provider Living arrangements for the past 2 months: Single Family Home                 DME Arranged: N/A DME Agency: NA       HH Arranged: RN, PT, Disease Management HH Agency: [facility] Date HH Agency Contacted: 12/03/23    Representative spoke with at [facility] Agency: [place]   Social Determinants of Health (SDOH) Interventions SDOH Screenings   Food Insecurity: No Food Insecurity (12/02/2023)  Housing: Low Risk  (12/02/2023)  Transportation Needs: No Transportation Needs (12/02/2023)  Utilities: Not At Risk (12/02/2023)  Alcohol  Screen: Low Risk  (09/21/2023)  Depression (PHQ2-9): Low Risk  (09/21/2023)  Financial Resource Strain: Low Risk  (09/21/2023)  Physical Activity: Sufficiently Active (09/21/2023)  Social Connections: Moderately Isolated (12/02/2023)  Stress: No Stress Concern Present (09/21/2023)  Tobacco Use: Low Risk  (12/02/2023)  Health Literacy: Adequate Health Literacy (09/21/2023)    Readmission Risk Interventions    12/03/2023   11:27 AM 10/24/2023    2:20 PM 10/16/2023    3:51 PM  Readmission Risk Prevention Plan  Transportation Screening Complete Complete Complete  Medication Review Oceanographer) Complete Complete Complete  PCP or Specialist appointment within 3-5 days of discharge Complete Complete Complete  HRI or Home Care Consult Complete Complete Complete  SW Recovery Care/Counseling Consult  Complete Complete  Palliative Care Screening Not Applicable  Not Applicable  Skilled Nursing Facility Not Applicable Complete Complete

## 2023-12-03 NOTE — Plan of Care (Signed)
  Problem: Clinical Measurements: Goal: Cardiovascular complication will be avoided Outcome: Progressing   Problem: Safety: Goal: Ability to remain free from injury will improve Outcome: Progressing   Problem: Skin Integrity: Goal: Risk for impaired skin integrity will decrease Outcome: Progressing   

## 2023-12-03 NOTE — Consult Note (Addendum)
 Reason for Consult: Acute kidney injury on chronic kidney disease stage V, volume overload Referring Physician: Sigurd Pac, MD [facility])  HPI:  89 year old woman with past medical history significant for hypertension, type 2 diabetes mellitus, dyslipidemia, bronchial asthma, gastroesophageal reflux disease, prior history of DVT and chronic kidney disease stage V (baseline creatinine appears to be in the 5.1-5.4 range, followed up by Dr. Gearline @[facility] ).  Presented to the emergency room yesterday with progressive worsening of lower extremity swelling and shortness of breath where she was found to have evidence of volume overload with mild pulmonary interstitial edema and small bilateral pleural effusions.  She denies any preceding nausea, vomiting or diarrhea and reports some intermittent cough with clear/white sputum production.  She denies hemoptysis.  She denies any dysuria, urgency, frequency, flank pain, fever or chills.  Shortness of breath improved transiently with bronchodilators.  Her daughter who is at bedside informs me that she has not reliably been taking her medications since getting home from a skilled nursing facility where she was placed following her last admission to the hospital.  There is concern that she is not able to safely care for herself.  Based on previous and my current discussion with the patient and family, the decision was made not to undertake dialysis in the future.  They would adopt a conservative management approach.  Past Medical History:  Diagnosis Date   Acute superficial venous thrombosis of left lower extremity 03/27/2014   concern about hx and potential extensive on exam tenderness calf and low dose lovenox  40 qd 2-4 weekselevetion and close  fu advised .    Allergy    Anemia    Anxiety    Arthritis    shoulders (09/09/2012)   Asthma 09/08/2012   Carotid bruit    us  2018  low risk 1 - 39%   Cataract    bil cateracts removed   CKD (chronic kidney disease)  stage 5, GFR less than 15 ml/min (HCC)    as of may 2025, creat 4.5- 5.5 range   Clotting disorder (HCC)    blood clots in legs   Diabetes mellitus without complication (HCC)    Borderline per pt; being monitored.  no meds per pt   Diverticulosis    Dyspnea 04/27/2014   GERD (gastroesophageal reflux disease)    Headache(784.0)    related to my high blood pressure (09/09/2012)   Hiatal hernia    History of DVT (deep vein thrombosis)    RLE (09/09/2012) coumadine cant take asa so on plavix     HTN (hypertension) 09/08/2012   Hyperlipidemia    Lupus    cured years ago (09/09/2012)   Other dysphagia 02/11/2013   Syncope and collapse 01/21/2018   Varicose veins    Varicose veins of leg with complications 12/05/2010    Past Surgical History:  Procedure Laterality Date   ABDOMINAL HYSTERECTOMY     partial   BREAST EXCISIONAL BIOPSY Right    BREAST SURGERY     CARPAL TUNNEL RELEASE Right 2000's   CARPAL TUNNEL RELEASE Bilateral 10/21/2013   Procedure: BILATERAL  CARPAL TUNNEL RELEASE;  Surgeon: Arley JONELLE Curia, MD;  Location: [facility];  Service: Orthopedics;  Laterality: Bilateral;   COLONOSCOPY     SHOULDER OPEN ROTATOR CUFF REPAIR Bilateral 2000's   TRIGGER FINGER RELEASE Right 10/21/2013   Procedure: RELEASE TRIGGER FINGER/A-1 PULLEY RIGHT MIDDLE AND RIGHT RING;  Surgeon: Arley JONELLE Curia, MD;  Location: [facility];  Service: Orthopedics;  Laterality: Right;   UPPER GASTROINTESTINAL ENDOSCOPY      Family History  Problem Relation Age of Onset   Hypertension Mother    Lung cancer Mother    Hypertension Father    Lung cancer Father    Breast cancer Sister    Colon cancer Sister        ? 59' s dx - died in 22's   Breast cancer Sister    Hypertension Brother    Rectal cancer Brother    Stomach cancer Brother    Breast cancer Brother    Heart attack Daughter    Esophageal cancer Daughter    Hypertension Daughter    Diabetes Daughter     Breast cancer Paternal Aunt    Lung cancer Other        both parents    Pancreatic cancer Neg Hx     Social History:  reports that she has never smoked. She has never used smokeless tobacco. She reports that she does not drink alcohol  and does not use drugs.  Allergies:  Allergies  Allergen Reactions   Penicillins Hives   Asa [Aspirin] Other (See Comments)    Wheezing Acetaminophen  is OK    Fish Allergy Itching, Nausea And Vomiting and Swelling    Swelling of the face    Medications: I have reviewed the patient's current medications. Scheduled:  sodium chloride    Intravenous Once   amLODipine   10 mg Oral Daily   budesonide -glycopyrrolate -formoterol   2 puff Inhalation BID   carvedilol   12.5 mg Oral BID   citalopram   10 mg Oral Daily   cyanocobalamin   1,000 mcg Oral Daily   ferrous sulfate   325 mg Oral Q breakfast   furosemide   80 mg Intravenous BID   hydrALAZINE   100 mg Oral TID   montelukast   10 mg Oral Daily   sodium bicarbonate   650 mg Oral BID       Latest Ref Rng & Units 12/03/2023    3:20 AM 12/02/2023    7:52 AM 12/02/2023    2:30 AM  BMP  Glucose 70 - 99 mg/dL 892   882   BUN 8 - 23 mg/dL 77   75   Creatinine 9.55 - 1.00 mg/dL 4.75  4.74  4.98   Sodium 135 - 145 mmol/L 137   138   Potassium 3.5 - 5.1 mmol/L 5.0   4.8   Chloride 98 - 111 mmol/L 107   108   CO2 22 - 32 mmol/L 19   17   Calcium  8.9 - 10.3 mg/dL 8.6   8.7       Latest Ref Rng & Units 12/03/2023    3:20 AM 12/02/2023    7:52 AM 12/02/2023    2:30 AM  CBC  WBC 4.0 - 10.5 K/uL 4.0  5.3  6.0   Hemoglobin 12.0 - 15.0 g/dL 6.8  7.7  7.4   Hematocrit 36.0 - 46.0 % 21.3  24.6  23.7   Platelets 150 - 400 K/uL 220  240  215    Urinalysis    Component Value Date/Time   COLORURINE YELLOW 10/14/2023 1720   APPEARANCEUR CLEAR 10/14/2023 1720   LABSPEC 1.013 10/14/2023 1720   PHURINE 6.0 10/14/2023 1720   GLUCOSEU 50 (A) 10/14/2023 1720   GLUCOSEU NEGATIVE 01/16/2023 1238   HGBUR NEGATIVE  10/14/2023 1720   BILIRUBINUR NEGATIVE 10/14/2023 1720   KETONESUR NEGATIVE 10/14/2023 1720   PROTEINUR >=300 (A) 10/14/2023 1720   UROBILINOGEN 0.2 01/16/2023  1238   NITRITE NEGATIVE 10/14/2023 1720   LEUKOCYTESUR NEGATIVE 10/14/2023 1720      DG Chest Portable 1 View Result Date: 12/02/2023 EXAM: 1 VIEW XRAY OF THE CHEST 12/02/2023 03:03:00 AM COMPARISON: 11/30/2023 CLINICAL HISTORY: Chest pain, shortness of breath with feet and ankles swollen for 2 days. FINDINGS: LUNGS AND PLEURA: Mild interstitial edema. Possible small bilateral pleural effusions. No pneumothorax. HEART AND MEDIASTINUM: Cardiomegaly. BONES AND SOFT TISSUES: No acute osseous abnormality. IMPRESSION: 1. Cardiomegaly with mild interstitial edema. 2. Possible small bilateral pleural effusions. Electronically signed by: Pinkie Pebbles MD 12/02/2023 03:05 AM EDT RP Workstation: [id number]    Review of Systems  Constitutional:  Positive for appetite change, chills and fatigue. Negative for fever.  HENT:  Negative for nosebleeds, sore throat and trouble swallowing.   Eyes:  Negative for photophobia and visual disturbance.  Respiratory:  Positive for cough, shortness of breath and wheezing. Negative for chest tightness.   Cardiovascular:  Positive for leg swelling. Negative for chest pain and palpitations.  Gastrointestinal:  Negative for abdominal pain, constipation, diarrhea, nausea and vomiting.  Genitourinary:  Negative for difficulty urinating, dysuria, frequency and hematuria.  Musculoskeletal:  Positive for gait problem. Negative for arthralgias and myalgias.  Skin:  Negative for rash and wound.  Neurological:  Positive for weakness and headaches.   Blood pressure (!) 163/64, pulse (!) 58, temperature 97.8 F (36.6 C), temperature source Oral, resp. rate 18, height 5' 3 (1.6 m), weight 61.5 kg, SpO2 99%. Physical Exam Vitals reviewed.  Constitutional:      Appearance: She is well-developed and normal weight. She  is ill-appearing.  HENT:     Head: Normocephalic and atraumatic.  Neck:     Vascular: No JVD.  Cardiovascular:     Rate and Rhythm: Normal rate and regular rhythm.     Pulses: Normal pulses.  Pulmonary:     Effort: Pulmonary effort is normal.     Breath sounds: Examination of the right-lower field reveals rhonchi. Examination of the left-lower field reveals rhonchi and rales. Rhonchi and rales present.  Chest:     Chest wall: No mass or tenderness.  Abdominal:     General: Bowel sounds are normal.     Palpations: Abdomen is soft. There is no mass.  Musculoskeletal:     Cervical back: Normal range of motion and neck supple.     Right lower leg: Edema present.     Left lower leg: Edema present.     Comments: 2+ bilateral lower extremity edema  Skin:    General: Skin is warm and dry.     Findings: No rash.  Neurological:     Mental Status: She is alert and oriented to person, place, and time.    Assessment/Plan: 1.  Acute kidney injury on chronic kidney disease stage V: This appears to be cardiorenal in mechanism with volume overload on presentation secondary to diuretic nonadherence.  Agree with plans to start IV diuresis for volume unloading with cautious monitoring of input/output along with labs.  She/family have made the decision not to pursue renal replacement therapy in case of worsening renal function/decompensation.  Continue current dose of furosemide  80 mg IV twice daily with sodium restriction. Avoid nephrotoxic medications including NSAIDs and iodinated intravenous contrast exposure unless the latter is absolutely indicated.  Preferred narcotic agents for pain control are hydromorphone, fentanyl , and methadone. Morphine  should not be used. Avoid Baclofen and avoid oral sodium phosphate  and magnesium  citrate based laxatives / bowel preps. Continue strict  Input and Output monitoring. Will monitor the patient closely with you and intervene or adjust therapy as indicated by  changes in clinical status/labs. 2.  Acute exacerbation of congestive heart failure: With previous 2D echo evidence of combined systolic and diastolic congestive heart failure and decompensation secondary to diuretic nonadherence.  Continue increased diuretic dose at this time.  Family concerned about her ability to adhere with medical recommendations and inquire about placement to skilled nursing facility versus assisted living. 3.  Hypertension: Elevated blood pressures likely secondary to volume status/CHF decompensation.  Monitor with resumption of antihypertensive therapy and ongoing diuresis. 4.  Anemia: Without overt blood loss and appears to be secondary to chronic illness including chronic kidney disease.  Acute hemoglobin drop noted this morning prompting PRBC transfusion.  Iron studies pending from this morning. 5.  Bronchial asthma: Resume bronchodilator therapy/home inhalers per primary service.   Gordy MARLA Blanch 12/03/2023, 11:53 AM

## 2023-12-03 NOTE — Plan of Care (Signed)
 Hemoglobin 6.8 this morning.  Patient does not have any evidence of GI bleed. Transfusing 1 unit of blood.  Need to follow-up with repeat H&H after blood transfusion. Discontinue heparin  DVT prophylaxis you are currently on SCD and TED hose.

## 2023-12-04 ENCOUNTER — Telehealth: Payer: Self-pay

## 2023-12-04 ENCOUNTER — Inpatient Hospital Stay

## 2023-12-04 DIAGNOSIS — E8779 Other fluid overload: Secondary | ICD-10-CM | POA: Diagnosis not present

## 2023-12-04 DIAGNOSIS — I509 Heart failure, unspecified: Secondary | ICD-10-CM

## 2023-12-04 DIAGNOSIS — Z515 Encounter for palliative care: Secondary | ICD-10-CM | POA: Diagnosis not present

## 2023-12-04 DIAGNOSIS — Z7189 Other specified counseling: Secondary | ICD-10-CM

## 2023-12-04 LAB — CBC
HCT: 23.8 % — ABNORMAL LOW (ref 36.0–46.0)
Hemoglobin: 7.8 g/dL — ABNORMAL LOW (ref 12.0–15.0)
MCH: 30.8 pg (ref 26.0–34.0)
MCHC: 32.8 g/dL (ref 30.0–36.0)
MCV: 94.1 fL (ref 80.0–100.0)
Platelets: 222 K/uL (ref 150–400)
RBC: 2.53 MIL/uL — ABNORMAL LOW (ref 3.87–5.11)
RDW: 14.4 % (ref 11.5–15.5)
WBC: 4.9 K/uL (ref 4.0–10.5)
nRBC: 0 % (ref 0.0–0.2)

## 2023-12-04 LAB — BASIC METABOLIC PANEL WITH GFR
Anion gap: 12 (ref 5–15)
BUN: 75 mg/dL — ABNORMAL HIGH (ref 8–23)
CO2: 17 mmol/L — ABNORMAL LOW (ref 22–32)
Calcium: 8.5 mg/dL — ABNORMAL LOW (ref 8.9–10.3)
Chloride: 107 mmol/L (ref 98–111)
Creatinine, Ser: 5.5 mg/dL — ABNORMAL HIGH (ref 0.44–1.00)
GFR, Estimated: 7 mL/min — ABNORMAL LOW (ref >60)
Glucose, Bld: 94 mg/dL (ref 70–99)
Potassium: 5.1 mmol/L (ref 3.5–5.1)
Sodium: 136 mmol/L (ref 135–145)

## 2023-12-04 MED ORDER — METOLAZONE 2.5 MG PO TABS
2.5000 mg | ORAL_TABLET | Freq: Once | ORAL | Status: AC
  Administered 2023-12-04: 2.5 mg via ORAL
  Filled 2023-12-04: qty 1

## 2023-12-04 MED ORDER — DARBEPOETIN ALFA 60 MCG/0.3ML IJ SOSY
60.0000 ug | PREFILLED_SYRINGE | INTRAMUSCULAR | Status: DC
  Administered 2023-12-04: 60 ug via SUBCUTANEOUS
  Filled 2023-12-04: qty 0.3

## 2023-12-04 MED ORDER — SEVELAMER CARBONATE 800 MG PO TABS
800.0000 mg | ORAL_TABLET | Freq: Three times a day (TID) | ORAL | Status: DC
  Administered 2023-12-04 – 2023-12-10 (×15): 800 mg via ORAL
  Filled 2023-12-04 (×16): qty 1

## 2023-12-04 MED ORDER — FUROSEMIDE 10 MG/ML IJ SOLN
INTRAMUSCULAR | Status: AC
  Filled 2023-12-04: qty 4

## 2023-12-04 MED ORDER — FUROSEMIDE 10 MG/ML IJ SOLN
80.0000 mg | Freq: Three times a day (TID) | INTRAMUSCULAR | Status: DC
  Administered 2023-12-04 – 2023-12-05 (×6): 80 mg via INTRAVENOUS
  Filled 2023-12-04 (×5): qty 8

## 2023-12-04 NOTE — Progress Notes (Signed)
 PROGRESS NOTE    [name]  FMW:[id number] DOB: 1933-06-15 DOA: 12/02/2023 PCP: Charlett Apolinar POUR, MD  89/F w/ chronic combined CHF, CKD 5, anxiety, GERD, hypertension, dyslipidemia presented to the ED with worsening dyspnea and edema for 2 to 3 days. -Reports compliance with diuretics - In the ER hypertensive, creatinine 5.01, BUN 75, bicarb 17, hemoglobin 7.4, BNP 95, chest x-ray with cardiomegaly and interstitial edema, small effusions - Admitted, started on diuretics, nephrology following, seen by palliative care as well  Subjective: - Feels a little better overall  Assessment and Plan:  Volume overload Acute on chronic combined systolic and diastolic CHF CKD stage V: - Improving with diuresis, 1.6 L negative, urine output likely inaccurate - Nephrology following, plan to increase IV Lasix  today, she is not a candidate for dialysis, appreciate nephrology input, patient is also reluctant to consider this -GDMT limited by CKD 5 -Palliative consulted, patient wishes for full code and full scope> she has limited understanding however daughter understands decision for no HD, will need hospice when she is ESRD - PT OT, TOC consult, may need SNF   Hypertensive urgency: -continue Coreg , hydralazine , amlodipine , clonidine  -Monitor blood pressure closely   Elevated troponin:  -Likely in the setting of fluid overload.  Patient denies any chest pain.  Continue to monitor on telemetry   Anemia of CKD: - Baseline hemoglobin between 8-10.   - Hemoglobin dropped to 6.8 yesterday, transfused 1 unit PRBC, anemia panel suggestive of chronic disease   Moderate persistent asthma: - Continue home inhalers.    Depression/anxiety/insomnia:  -Continue Celexa  and Ativan  as needed   Mild dementia, cognitive deficits -Noted   DVT prophylaxis: Heparin  Code Status: Full code Family Communication: Discussed with patient in detail, no family at bedside Disposition Plan: May need SNF, PT OT  recommendations pending  Consultants:    Procedures:   Antimicrobials:    Objective: Vitals:   12/04/23 0012 12/04/23 0343 12/04/23 0728 12/04/23 0815  BP: (!) 143/56 (!) 147/57 (!) 152/49   Pulse: 65 73 76 73  Resp: 18 18 18 18   Temp: 97.7 F (36.5 C) 98.6 F (37 C) 98.6 F (37 C)   TempSrc: Oral Oral Oral   SpO2:  97% 99% 98%  Weight:  62 kg    Height:        Intake/Output Summary (Last 24 hours) at 12/04/2023 1123 Last data filed at 12/04/2023 0826 Gross per 24 hour  Intake 435 ml  Output 900 ml  Net -465 ml   Filed Weights   12/02/23 1237 12/03/23 0404 12/04/23 0343  Weight: 62.3 kg 61.5 kg 62 kg    Examination:  General exam: Appears calm and comfortable  HEENT: +JVD Respiratory system: Few basilar rales Cardiovascular system: S1 & S2 heard, RRR.  Abd: nondistended, soft and nontender.Normal bowel sounds heard. Central nervous system: Alert and oriented. No focal neurological deficits. Extremities: 1+ edema Skin: No rashes Psychiatry:  Mood & affect appropriate.     Data Reviewed:   CBC: Recent Labs  Lab 11/29/23 2354 12/02/23 0230 12/02/23 0752 12/03/23 0320 12/04/23 0238  WBC 5.8 6.0 5.3 4.0 4.9  HGB 7.5* 7.4* 7.7* 6.8* 7.8*  HCT 24.9* 23.7* 24.6* 21.3* 23.8*  MCV 100.0 97.1 96.5 95.1 94.1  PLT 211 215 240 220 222   Basic Metabolic Panel: Recent Labs  Lab 11/29/23 2354 12/02/23 0230 12/02/23 0752 12/03/23 0320 12/04/23 0238  NA 140 138  --  137 136  K 5.0 4.8  --  5.0 5.1  CL 109 108  --  107 107  CO2 18* 17*  --  19* 17*  GLUCOSE 123* 117*  --  107* 94  BUN 75* 75*  --  77* 75*  CREATININE 5.24* 5.01* 5.25* 5.24* 5.50*  CALCIUM  8.4* 8.7*  --  8.6* 8.5*  MG  --   --  2.1 2.0  --   PHOS  --   --  5.8*  --   --    GFR: Estimated Creatinine Clearance: 6 mL/min (A) (by C-G formula based on SCr of 5.5 mg/dL (H)). Liver Function Tests: No results for input(s): AST, ALT, ALKPHOS, BILITOT, PROT, ALBUMIN  in the last 168  hours. No results for input(s): LIPASE, AMYLASE in the last 168 hours. No results for input(s): AMMONIA in the last 168 hours. Coagulation Profile: Recent Labs  Lab 11/29/23 2354  INR 1.0   Cardiac Enzymes: No results for input(s): CKTOTAL, CKMB, CKMBINDEX, TROPONINI in the last 168 hours. BNP (last 3 results) Recent Labs    09/22/23 0036  PROBNP >35,000.0*   HbA1C: No results for input(s): HGBA1C in the last 72 hours. CBG: No results for input(s): GLUCAP in the last 168 hours. Lipid Profile: No results for input(s): CHOL, HDL, LDLCALC, TRIG, CHOLHDL, LDLDIRECT in the last 72 hours. Thyroid  Function Tests: Recent Labs    12/02/23 0753  TSH 3.573   Anemia Panel: Recent Labs    12/03/23 1104  VITAMINB12 2,283*  FOLATE 10.8  FERRITIN 371*  TIBC 174*  IRON 67  RETICCTPCT 1.5   Urine analysis:    Component Value Date/Time   COLORURINE YELLOW 10/14/2023 1720   APPEARANCEUR CLEAR 10/14/2023 1720   LABSPEC 1.013 10/14/2023 1720   PHURINE 6.0 10/14/2023 1720   GLUCOSEU 50 (A) 10/14/2023 1720   GLUCOSEU NEGATIVE 01/16/2023 1238   HGBUR NEGATIVE 10/14/2023 1720   BILIRUBINUR NEGATIVE 10/14/2023 1720   KETONESUR NEGATIVE 10/14/2023 1720   PROTEINUR >=300 (A) 10/14/2023 1720   UROBILINOGEN 0.2 01/16/2023 1238   NITRITE NEGATIVE 10/14/2023 1720   LEUKOCYTESUR NEGATIVE 10/14/2023 1720   Sepsis Labs: @LABRCNTIP (procalcitonin:4,lacticidven:4)  )No results found for this or any previous visit (from the past 240 hours).   Radiology Studies: No results found.    Scheduled Meds:  amLODipine   10 mg Oral Daily   budesonide -glycopyrrolate -formoterol   2 puff Inhalation BID   carvedilol   12.5 mg Oral BID   citalopram   10 mg Oral Daily   cyanocobalamin   1,000 mcg Oral Daily   darbepoetin (ARANESP ) injection - DIALYSIS  60 mcg Subcutaneous Q Tue-1800   ferrous sulfate   325 mg Oral Q breakfast   furosemide   80 mg Intravenous TID    hydrALAZINE   100 mg Oral TID   montelukast   10 mg Oral Daily   sevelamer  carbonate  800 mg Oral TID WC   sodium bicarbonate   650 mg Oral BID   Continuous Infusions:   LOS: 2 days    Time spent:    Sigurd Pac, MD [facility]   12/04/2023, 11:23 AM

## 2023-12-04 NOTE — Progress Notes (Addendum)
 Patient ID: [name], female   DOB: 1934/04/22, 89 y.o.   MRN: [id number] [facility] Progress Note   Assessment/ Plan:   1.  Acute kidney injury on chronic kidney disease stage V: This appears to be cardiorenal in mechanism with volume overload on presentation secondary to diuretic nonadherence.  Unimpressive response to diuretics overnight based on urine output charted at home with a slight drop of renal function.  Will uptitrate furosemide  to optimize volume unloading.  She again confirms today that she would not undertake dialysis (with the understanding that this may result in her death). 2.  Acute exacerbation of congestive heart failure: With previous 2D echo evidence of combined systolic and diastolic congestive heart failure and decompensation secondary to diuretic nonadherence.  Continue efforts at optimization of diuretic therapy. 3.  Hypertension: Elevated blood pressures likely secondary to volume status/CHF decompensation.  Monitor with resumption of antihypertensive therapy and ongoing diuresis. 4.  Anemia: Without overt blood loss and appears to be secondary to chronic illness including chronic kidney disease.  Acute hemoglobin drop noted this morning prompting PRBC transfusion.  Iron stores appear adequate from labs yesterday morning and I will start darbepoetin for anemia management. 5.  Bronchial asthma: Resume bronchodilator therapy/home inhalers per primary service.  Subjective:   Reports to be feeling a little better this morning but had orthopnea overnight.  Denies cough/wheezing.   Objective:   BP (!) 152/49 (BP Location: Left Arm)   Pulse 76   Temp 98.6 F (37 C) (Oral)   Resp 18   Ht 5' 3 (1.6 m)   Wt 62 kg   SpO2 99%   BMI 24.20 kg/m   Intake/Output Summary (Last 24 hours) at 12/04/2023 0805 Last data filed at 12/04/2023 0730 Gross per 24 hour  Intake 435 ml  Output 1200 ml  Net -765 ml   Weight change: -0.339 kg  Physical Exam: Gen:  Appears comfortable sitting up in recliner, eating breakfast CVS: Pulse regular rhythm, normal rate, S1 and S2 normal Resp: Fine rales over bases with some expiratory rhonchi audible Abd: Soft, flat, nontender, bowel sounds normal Ext: 2+ edema of lower extremities  Imaging: No results found.  Labs: BMET Recent Labs  Lab 11/29/23 2354 12/02/23 0230 12/02/23 0752 12/03/23 0320 12/04/23 0238  NA 140 138  --  137 136  K 5.0 4.8  --  5.0 5.1  CL 109 108  --  107 107  CO2 18* 17*  --  19* 17*  GLUCOSE 123* 117*  --  107* 94  BUN 75* 75*  --  77* 75*  CREATININE 5.24* 5.01* 5.25* 5.24* 5.50*  CALCIUM  8.4* 8.7*  --  8.6* 8.5*  PHOS  --   --  5.8*  --   --    CBC Recent Labs  Lab 12/02/23 0230 12/02/23 0752 12/03/23 0320 12/04/23 0238  WBC 6.0 5.3 4.0 4.9  HGB 7.4* 7.7* 6.8* 7.8*  HCT 23.7* 24.6* 21.3* 23.8*  MCV 97.1 96.5 95.1 94.1  PLT 215 240 220 222    Medications:     amLODipine   10 mg Oral Daily   budesonide -glycopyrrolate -formoterol   2 puff Inhalation BID   carvedilol   12.5 mg Oral BID   citalopram   10 mg Oral Daily   cyanocobalamin   1,000 mcg Oral Daily   ferrous sulfate   325 mg Oral Q breakfast   furosemide   80 mg Intravenous BID   hydrALAZINE   100 mg Oral TID   montelukast   10 mg  Oral Daily   sodium bicarbonate   650 mg Oral BID    Gordy Blanch, MD 12/04/2023, 8:05 AM

## 2023-12-04 NOTE — Progress Notes (Addendum)
 This chaplain responded to PMT PA-Josseline's consult for spiritual care. The Pt. is awake and sitting in the bedside recliner. The chaplain provided assistance with calling the Pt. daughter-Myra. The Pt. began the conversation and the chaplain exited the room.  **The Pt. motioned the chaplain back in the room to talk to the Pt. daughter-Myra on the phone. The chaplain understands Myra will visit today.   The chaplain listened reflectively as the Pt. shared the names and relationships of the people on the chalkboard. The Pt. shared she lives with her niece-Woman on [address], but she wants all of her children to be involved in medical decision making. The chaplain asked what is important to her and the Pt. shared she would not want dialysis.  The Pt. story telling returns several times to the Pt. calling Myra to find out where she is. The Pt. can tell the chaplain she is in the hospital now, but previously thought she was in a nursing home.    The NT entered for patient care and the Pt. accepted the chaplain's invitation for a revisit.   This chaplain is available for F/U spiritual care as needed.  Chaplain Leeroy Hummer [phone]

## 2023-12-04 NOTE — Telephone Encounter (Signed)
 Pt's daughter called stating that the pt's appts today will need to be rescheduled d/t pt is currently admitted in the hospital.  Pt's daughter stated she's not sure if Dr. Lanny is aware but she also wanted to make Dr. Lanny aware.  Pt's daughter is unsure as to when the pt will be d/c from the hospital.  Instructed pt's daughter to contact Dr. Demetra office when the pt is d/c home.  Pt's daughter verbalized understanding and had no further questions or concerns at this time.

## 2023-12-04 NOTE — Evaluation (Signed)
 Occupational Therapy Evaluation Patient Details Name: [name] MRN: [id number] DOB: 08/29/1933 Today's Date: 12/04/2023   History of Present Illness   89 y.o. female adm 12/02/23 with SOB, LB edema, acute on chronic CHF. PMHx: HTN, HLD, asthma, CKD, T2DM, lupus, GERD, anxiety, CHF, anemia     Clinical Impressions PT admitted with SOB and acute chronic CHF. Pt currently with functional limitiations due to the deficits listed below (see OT problem list). Pt is from home with family/ friends assisting per chart review. Pt demonstrates cognitive and balance deficits at this time. Pt does express being home at times alone. Pt high fall risk at this time and recommendation for RW instead of cane. If family is unable to provide care needed will need community based care.  Pt will benefit from skilled OT to increase their independence and safety with adls and balance to allow discharge skilled inpatient follow up therapy, <3 hours/day. .      If plan is discharge home, recommend the following:   A little help with walking and/or transfers;A little help with bathing/dressing/bathroom     Functional Status Assessment   Patient has had a recent decline in their functional status and demonstrates the ability to make significant improvements in function in a reasonable and predictable amount of time.     Equipment Recommendations   BSC/3in1;Other (comment) (RW)     Recommendations for Other Services         Precautions/Restrictions   Precautions Precautions: Fall Restrictions Weight Bearing Restrictions Per Provider Order: No     Mobility Bed Mobility               General bed mobility comments: up with PT in hall    Transfers Overall transfer level: Needs assistance   Transfers: Sit to/from Stand Sit to Stand: Contact guard assist           General transfer comment: relies on bil Ue on chair arm rest to complete transfer      Balance      Sitting balance-Leahy Scale: Fair       Standing balance-Leahy Scale: Poor                             ADL either performed or assessed with clinical judgement   ADL Overall ADL's : Needs assistance/impaired Eating/Feeding: Set up Eating/Feeding Details (indicate cue type and reason): poor po intake. Ot helping patient open all containers, apply dentures and setting all options visually in front of patient in the chair. Grooming: Oral care;Minimal assistance;Sitting Grooming Details (indicate cue type and reason): needs cues to apply both dentures pt only applying top dentures                 Toilet Transfer: Minimal assistance;Ambulation;BSC/3in1;Rolling walker (2 wheels) Toilet Transfer Details (indicate cue type and reason): small output volume CNA informed Toileting- Clothing Manipulation and Hygiene: Contact guard assist;Sit to/from stand       Functional mobility during ADLs: Minimal assistance;Rolling walker (2 wheels)       Vision   Vision Assessment?: No apparent visual deficits     Perception         Praxis         Pertinent Vitals/Pain Pain Assessment Pain Assessment: No/denies pain     Extremity/Trunk Assessment Upper Extremity Assessment Upper Extremity Assessment: Overall WFL for tasks assessed   Lower Extremity Assessment Lower Extremity Assessment: Defer to PT evaluation  Cervical / Trunk Assessment Cervical / Trunk Assessment: Kyphotic   Communication Communication Communication: No apparent difficulties   Cognition Arousal: Alert Behavior During Therapy: Flat affect Cognition: Cognition impaired, No family/caregiver present to determine baseline   Orientation impairments: Time, Situation Awareness: Online awareness impaired, Intellectual awareness intact       OT - Cognition Comments: pt reports I know the year but just dont want to talk about that right now. Pt describes location by a street near her house instead  of standing [place]                 Following commands: Intact       Cueing  General Comments      VSS on RA   Exercises     Shoulder Instructions      Home Living Family/patient expects to be discharged to:: Private residence Living Arrangements: Other relatives Available Help at Discharge: Family;Available PRN/intermittently Type of Home: Apartment Home Access: Stairs to enter Entrance Stairs-Number of Steps: 1   Home Layout: One level     Bathroom Shower/Tub: Chief Strategy Officer: Standard     Home Equipment: Cane - single point;Shower seat;Wheelchair - manual;BSC/3in1          Prior Functioning/Environment Prior Level of Function : Independent/Modified Independent             Mobility Comments: mod I with cane ADLs Comments: granddaughter does IADLs    OT Problem List: Decreased activity tolerance;Impaired balance (sitting and/or standing);Decreased cognition;Decreased safety awareness;Decreased knowledge of precautions;Decreased knowledge of use of DME or AE;Cardiopulmonary status limiting activity   OT Treatment/Interventions: Self-care/ADL training;Energy conservation;DME and/or AE instruction;Therapeutic activities;Cognitive remediation/compensation;Patient/family education;Balance training      OT Goals(Current goals can be found in the care plan section)   Acute Rehab OT Goals Patient Stated Goal: none stated OT Goal Formulation: Patient unable to participate in goal setting Time For Goal Achievement: 12/18/23 Potential to Achieve Goals: Good   OT Frequency:  Min 2X/week    Co-evaluation              AM-PAC OT 6 Clicks Daily Activity     Outcome Measure Help from another person eating meals?: A Little Help from another person taking care of personal grooming?: A Little Help from another person toileting, which includes using toliet, bedpan, or urinal?: A Little Help from another person bathing  (including washing, rinsing, drying)?: A Little Help from another person to put on and taking off regular upper body clothing?: A Little Help from another person to put on and taking off regular lower body clothing?: A Lot 6 Click Score: 17   End of Session Equipment Utilized During Treatment: Gait belt;Rolling walker (2 wheels) Nurse Communication: Mobility status;Precautions  Activity Tolerance: Patient tolerated treatment well Patient left: in chair;with call bell/phone within reach;with chair alarm set  OT Visit Diagnosis: Unsteadiness on feet (R26.81);Muscle weakness (generalized) (M62.81)                Time: 9258-9185 OT Time Calculation (min): 33 min Charges:  OT General Charges $OT Visit: 1 Visit OT Evaluation $OT Eval Moderate Complexity: 1 Mod OT Treatments $Self Care/Home Management : 8-22 mins   Brynn, OTR/L  Acute Rehabilitation Services Office: [phone] .   Ely Molt 12/04/2023, 11:40 AM

## 2023-12-04 NOTE — TOC Progression Note (Addendum)
 Transition of Care [facility]) - Progression Note    Patient Details  Name: [name] MRN: [id number] Date of Birth: October 26, 1933  [facility] CM/SW Contact  Luise JAYSON Pan, [place] Phone Number: 12/04/2023, 10:37 AM  Clinical Narrative:   PT rec'd HH.   CSW left VM for [facility] to inquire if patient is in copay days.   11:03 AM Darrien called CSW back and stated pt admitted to [facility] on 10/24/2023 - 11/09/2023; was at facility for 16 days. Per Emmalene, she has a couple more days of STR days that insurance will cover.  2:27 PM CSW emailed [name] and ALF information for future use.   TOC will continue to follow.    Expected Discharge Plan: Home w Home Health Services Barriers to Discharge: Continued Medical Work up  Expected Discharge Plan and Services In-house Referral: NA Discharge Planning Services: CM Consult Post Acute Care Choice: Home Health, Resumption of Svcs/PTA Provider Living arrangements for the past 2 months: Single Family Home                 DME Arranged: N/A DME Agency: NA       HH Arranged: RN, PT, Disease Management HH Agency: [facility] Date HH Agency Contacted: 12/03/23   Representative spoke with at [facility] Agency: [place]   Social Determinants of Health (SDOH) Interventions SDOH Screenings   Food Insecurity: No Food Insecurity (12/02/2023)  Housing: Low Risk  (12/02/2023)  Transportation Needs: No Transportation Needs (12/02/2023)  Utilities: Not At Risk (12/02/2023)  Alcohol  Screen: Low Risk  (09/21/2023)  Depression (PHQ2-9): Low Risk  (09/21/2023)  Financial Resource Strain: Low Risk  (09/21/2023)  Physical Activity: Sufficiently Active (09/21/2023)  Social Connections: Moderately Isolated (12/02/2023)  Stress: No Stress Concern Present (09/21/2023)  Tobacco Use: Low Risk  (12/02/2023)  Health Literacy: Adequate Health Literacy (09/21/2023)    Readmission Risk Interventions    12/03/2023   11:27 AM 10/24/2023    2:20 PM  10/16/2023    3:51 PM  Readmission Risk Prevention Plan  Transportation Screening Complete Complete Complete  Medication Review Oceanographer) Complete Complete Complete  PCP or Specialist appointment within 3-5 days of discharge Complete Complete Complete  HRI or Home Care Consult Complete Complete Complete  SW Recovery Care/Counseling Consult  Complete Complete  Palliative Care Screening Not Applicable  Not Applicable  Skilled Nursing Facility Not Applicable Complete Complete

## 2023-12-04 NOTE — Evaluation (Signed)
 Physical Therapy Evaluation Patient Details Name: [name] MRN: [id number] DOB: [birth year] Today's Date: 12/04/2023  History of Present Illness  89 y.o. female adm 12/02/23 with SOB, LB edema, acute on chronic CHF. PMHx: HTN, HLD, asthma, CKD, T2DM, lupus, GERD, anxiety, CHF, anemia  Clinical Impression  Pt pleasant with decreased orientation, memory and safety awareness. Pt reports she lives with granddaughter who works and has assist from other family. Pt currently requires UB support for all standing with decreased awareness of assist needed and decreased activity tolerance. Pt will benefit from acute therapy to maximize mobility, safety and independence to decrease burden of care. Recommend HHPT if family can provide 24hr care and long term ALF if family struggling to provide supervision. Encouraged OOB with assist acutely.        If plan is discharge home, recommend the following: A little help with walking and/or transfers;A little help with bathing/dressing/bathroom;Assistance with cooking/housework;Assist for transportation;Supervision due to cognitive status;Direct supervision/assist for medications management;Direct supervision/assist for financial management;Help with stairs or ramp for entrance   Can travel by private vehicle        Equipment Recommendations Rolling walker (2 wheels)  Recommendations for Other Services       Functional Status Assessment Patient has had a recent decline in their functional status and/or demonstrates limited ability to make significant improvements in function in a reasonable and predictable amount of time     Precautions / Restrictions Precautions Precautions: Fall      Mobility  Bed Mobility               General bed mobility comments: sitting EOB on arrival    Transfers Overall transfer level: Needs assistance   Transfers: Sit to/from Stand Sit to Stand: Supervision           General transfer comment: pt with  decreased control of descent to chair and toilet with cues for hand placement and safety    Ambulation/Gait Ambulation/Gait assistance: Min assist Gait Distance (Feet): 100 Feet Assistive device: 1 person hand held assist, Straight cane Gait Pattern/deviations: Step-through pattern, Decreased stride length, Narrow base of support   Gait velocity interpretation: <1.8 ft/sec, indicate of risk for recurrent falls   General Gait Details: pt walking in room with cane and environmental support, in hall required cane and HHA with min assist for balance and support with education for need for RW not cane to provide appropriate assist  Stairs            Wheelchair Mobility     Tilt Bed    Modified Rankin (Stroke Patients Only)       Balance Overall balance assessment: Needs assistance Sitting-balance support: No upper extremity supported Sitting balance-Leahy Scale: Fair     Standing balance support: Single extremity supported Standing balance-Leahy Scale: Poor Standing balance comment: single to bil UE support with gait                             Pertinent Vitals/Pain Pain Assessment Pain Assessment: No/denies pain    Home Living Family/patient expects to be discharged to:: Private residence Living Arrangements: Other relatives Available Help at Discharge: Family;Available PRN/intermittently Type of Home: Apartment Home Access: Stairs to enter   Entrance Stairs-Number of Steps: 1   Home Layout: One level Home Equipment: Cane - single point;Shower seat;Wheelchair - manual;BSC/3in1      Prior Function Prior Level of Function : Independent/Modified Independent  Mobility Comments: mod I with cane ADLs Comments: granddaughter does IADLs     Extremity/Trunk Assessment   Upper Extremity Assessment Upper Extremity Assessment: Defer to OT evaluation    Lower Extremity Assessment Lower Extremity Assessment: Generalized weakness     Cervical / Trunk Assessment Cervical / Trunk Assessment: Kyphotic  Communication   Communication Communication: No apparent difficulties    Cognition Arousal: Alert Behavior During Therapy: Flat affect   PT - Cognitive impairments: No family/caregiver present to determine baseline, Memory, Orientation   Orientation impairments: Place, Situation                   PT - Cognition Comments: pt stating she is [place] and repeatedly asking therapist if she is keeping me from work, unable to recall room number Following commands: Intact       Cueing Cueing Techniques: Verbal cues, Gestural cues     General Comments      Exercises     Assessment/Plan    PT Assessment Patient needs continued PT services  PT Problem List Decreased balance;Decreased cognition;Decreased strength;Decreased activity tolerance;Decreased knowledge of use of DME;Decreased safety awareness       PT Treatment Interventions DME instruction;Gait training;Stair training;Functional mobility training;Therapeutic exercise;Therapeutic activities;Cognitive remediation;Patient/family education    PT Goals (Current goals can be found in the Care Plan section)  Acute Rehab PT Goals Patient Stated Goal: return home PT Goal Formulation: With patient Time For Goal Achievement: 12/18/23 Potential to Achieve Goals: Good    Frequency Min 1X/week     Co-evaluation               AM-PAC PT 6 Clicks Mobility  Outcome Measure Help needed turning from your back to your side while in a flat bed without using bedrails?: A Little Help needed moving from lying on your back to sitting on the side of a flat bed without using bedrails?: A Little Help needed moving to and from a bed to a chair (including a wheelchair)?: A Little Help needed standing up from a chair using your arms (e.g., wheelchair or bedside chair)?: A Little Help needed to walk in hospital room?: A Little Help needed  climbing 3-5 steps with a railing? : A Lot 6 Click Score: 17    End of Session   Activity Tolerance: Patient tolerated treatment well Patient left: in chair;Other (comment) (sitting at sink with OT) Nurse Communication: Mobility status PT Visit Diagnosis: Other abnormalities of gait and mobility (R26.89);Muscle weakness (generalized) (M62.81)    Time: 9258-9198 PT Time Calculation (min) (ACUTE ONLY): 20 min   Charges:   PT Evaluation $PT Eval Low Complexity: 1 Low   PT General Charges $$ ACUTE PT VISIT: 1 Visit         [name], PT Acute Rehabilitation Services Office: [phone]   Lenoard KATHEE Docker 12/04/2023, 8:54 AM

## 2023-12-05 DIAGNOSIS — I132 Hypertensive heart and chronic kidney disease with heart failure and with stage 5 chronic kidney disease, or end stage renal disease: Secondary | ICD-10-CM | POA: Diagnosis not present

## 2023-12-05 DIAGNOSIS — E8779 Other fluid overload: Secondary | ICD-10-CM | POA: Diagnosis not present

## 2023-12-05 DIAGNOSIS — Z515 Encounter for palliative care: Secondary | ICD-10-CM | POA: Diagnosis not present

## 2023-12-05 DIAGNOSIS — Z7189 Other specified counseling: Secondary | ICD-10-CM | POA: Diagnosis not present

## 2023-12-05 DIAGNOSIS — I509 Heart failure, unspecified: Secondary | ICD-10-CM | POA: Diagnosis not present

## 2023-12-05 LAB — BASIC METABOLIC PANEL WITH GFR
Anion gap: 13 (ref 5–15)
BUN: 79 mg/dL — ABNORMAL HIGH (ref 8–23)
CO2: 19 mmol/L — ABNORMAL LOW (ref 22–32)
Calcium: 8.4 mg/dL — ABNORMAL LOW (ref 8.9–10.3)
Chloride: 106 mmol/L (ref 98–111)
Creatinine, Ser: 5.74 mg/dL — ABNORMAL HIGH (ref 0.44–1.00)
GFR, Estimated: 7 mL/min — ABNORMAL LOW (ref >60)
Glucose, Bld: 88 mg/dL (ref 70–99)
Potassium: 4.8 mmol/L (ref 3.5–5.1)
Sodium: 138 mmol/L (ref 135–145)

## 2023-12-05 LAB — CBC
HCT: 22.5 % — ABNORMAL LOW (ref 36.0–46.0)
HCT: 27.2 % — ABNORMAL LOW (ref 36.0–46.0)
Hemoglobin: 7.3 g/dL — ABNORMAL LOW (ref 12.0–15.0)
Hemoglobin: 9 g/dL — ABNORMAL LOW (ref 12.0–15.0)
MCH: 30.9 pg (ref 26.0–34.0)
MCH: 31.1 pg (ref 26.0–34.0)
MCHC: 32.4 g/dL (ref 30.0–36.0)
MCHC: 33.1 g/dL (ref 30.0–36.0)
MCV: 95.3 fL (ref 80.0–100.0)
MCV: 94.1 fL (ref 80.0–100.0)
Platelets: 227 K/uL (ref 150–400)
Platelets: 219 K/uL (ref 150–400)
RBC: 2.36 MIL/uL — ABNORMAL LOW (ref 3.87–5.11)
RBC: 2.89 MIL/uL — ABNORMAL LOW (ref 3.87–5.11)
RDW: 14.6 % (ref 11.5–15.5)
RDW: 16.3 % — ABNORMAL HIGH (ref 11.5–15.5)
WBC: 4.7 K/uL (ref 4.0–10.5)
WBC: 5.4 K/uL (ref 4.0–10.5)
nRBC: 0 % (ref 0.0–0.2)
nRBC: 0 % (ref 0.0–0.2)

## 2023-12-05 LAB — PREPARE RBC (CROSSMATCH)

## 2023-12-05 MED ORDER — SODIUM CHLORIDE 0.9% IV SOLUTION: Freq: Once | INTRAVENOUS | Status: AC

## 2023-12-05 MED ORDER — METOLAZONE 2.5 MG PO TABS
2.5000 mg | ORAL_TABLET | Freq: Once | ORAL | Status: AC
  Administered 2023-12-05: 2.5 mg via ORAL
  Filled 2023-12-05: qty 1

## 2023-12-05 MED ORDER — MIRTAZAPINE 15 MG PO TABS
7.5000 mg | ORAL_TABLET | Freq: Every day | ORAL | Status: DC
  Administered 2023-12-05 – 2023-12-09 (×5): 7.5 mg via ORAL
  Filled 2023-12-05 (×5): qty 1

## 2023-12-05 NOTE — Progress Notes (Signed)
 Palliative:  HPI: 89 y.o. female  with past medical history of CKD stage V, hypertension, asthma, anemia of chronic disease, chronic combined systolic and diastolic CHF, anxiety, GERD, hypertension, hyperlipidemia  admitted on 12/02/2023 with dyspnea and bilateral edema in lower extremities for 2 to 3 days. In the ED, patient was found to have mild pulmonary edema on chest x-ray, BNP of 905, troponin of 136.  She was admitted for management of fluid overload secondary to acute on chronic CHF and underlying CKD 5. Patient has had 4 hospitalizations in the past 6 months as well as 1 ED visit.  PMT has been consulted to assist with goals of care conversation.  I met today with [name] as well as her eldest daughter, [name], at bedside. [name] shares with me that she is feeling much better than she was when she came in. She is unable to tell me specifics and seems to have poor recall. I reviewed issues with fluid overload due to weak heart and very sick kidneys. I discussed also potential for documenting HCPOA and [name] shares that she would need to think about who she would want to make decisions. [name] shares that Angela (Woman) and [name] make decisions. [name] asks about d/c and I share that this is a day by day decision and it is difficult to know when this may occur at this time.   I called and spoke with [facility]. [name] lives with [name]. [name] and I discuss expectations and goals of care. [name] has good understanding of situation. She confirms that her grandmother has been consistent about not wanting dialysis and family support her on this decision. I reaffirmed that I feel this is very appropriate as I do not feel she would tolerate dialysis well. I further discussed with [facility] resuscitation and that this would be much worse for [name] and would not lead to good outcome. [name] agrees and would be supportive of DNR status. [name] has attempted to discuss this  with [name] but her wishes have not been clear. [name] shares that they are planning to have family meeting (likely Saturday) with plans to discuss this specifically as well. We did discuss that resuscitation would likely only cause more suffering in the last moments to days of end of life and will not do anything to improve the heart or kidneys. [name] understands and will continue to discuss with the rest of her family.   [name] shares that her grandmother has not been taking her medication consistently. She says she gets tired taking so many pills. She requests liquid form of medications as able. She also expresses issues with insomnia worsened over the past month. They are hopeful to avoid hospitalization a little longer if they can get her to take her medications consistently. They do understand that rehospitalization is expected and inevitable and that one of these times we will not be successful to maintain even in the hospital. [facility] understands.   All questions/concerns. Emotional support provided.   Exam: Alert, poor recall and memory. No distress. Good spirits. Breathing regular, unlabored at rest. Poor intake. Abd soft. BLE edema improved.   Plan: - Family having ongoing discussions regarding wishes for code status and goals of care.   50 min  Bernarda Kitty, NP Palliative Medicine Team Pager [phone] (Please see [email or web address] for schedule) Team Phone [phone]

## 2023-12-05 NOTE — Progress Notes (Signed)
 This chaplain is present for F/U spiritual care with the Pt. The Pt. oldest sister-Vanessa is at the bedside. The Pt. smiles as the chaplain enters and reconnects with the importance of family in her medical decision making.  The Pt. shared with the chaplain today and on Tuesday, the importance of all her children being involved in medical decision making. The chaplain revisited the Pt. telephone list and learned the Pt. granddaughter-Shamika is Woman. The Pt. added Shamika and Myra work together.  The chaplain will coordinate F/U spiritual care with the PMT.  Chaplain Leeroy Hummer [phone]

## 2023-12-05 NOTE — Progress Notes (Signed)
 Patient ID: [name], female   DOB: 1933-11-10, 89 y.o.   MRN: [id number] [facility] Progress Note   Assessment/ Plan:   1.  Acute kidney injury on chronic kidney disease stage V: Appears cardiorenal in mechanism with volume overload/CHF decompensation secondary to diuretic nonadherence.  Clinically improving with net -0.3 L fluid balance however 1 kg weight loss.  Will continue current furosemide  dose with an additional dose of metolazone  today and plan to transition to oral diuretics tomorrow.  Renal function slightly lower.  She has made the decision not to pursue RRT. 2.  Acute exacerbation of congestive heart failure: With previous 2D echo evidence of combined systolic and diastolic congestive heart failure and decompensation secondary to diuretic nonadherence.  Continue efforts at optimization of diuretic therapy. 3.  Hypertension: Elevated blood pressures likely secondary to volume status/CHF decompensation.  Monitor with resumption of antihypertensive therapy and ongoing diuresis. 4.  Anemia: Without overt blood loss and appears to be secondary to chronic illness including chronic kidney disease.  Hemoglobin again drifting down with plans noted for PRBC transfusion today.  On ESA. 5.  Bronchial asthma: Resume bronchodilator therapy/home inhalers per primary service.  Subjective:   Reports improvement in orthopnea and overall feeling better.   Objective:   BP (!) 152/57   Pulse 70   Temp 98.1 F (36.7 C) (Oral)   Resp 18   Ht 5' 3 (1.6 m)   Wt 60.9 kg   SpO2 100%   BMI 23.79 kg/m   Intake/Output Summary (Last 24 hours) at 12/05/2023 0810 Last data filed at 12/05/2023 0800 Gross per 24 hour  Intake 600 ml  Output 950 ml  Net -350 ml   Weight change: -1.043 kg  Physical Exam: Gen: Appears comfortable resting propped up in bed CVS: Pulse regular rhythm, normal rate, S1 and S2 normal Resp: Decreased breath sounds over bases with expiratory rhonchi Abd:  Soft, flat, nontender, bowel sounds normal Ext: 1-2+ edema of lower extremities  Imaging: No results found.  Labs: BMET Recent Labs  Lab 11/29/23 2354 12/02/23 0230 12/02/23 0752 12/03/23 0320 12/04/23 0238 12/05/23 0224  NA 140 138  --  137 136 138  K 5.0 4.8  --  5.0 5.1 4.8  CL 109 108  --  107 107 106  CO2 18* 17*  --  19* 17* 19*  GLUCOSE 123* 117*  --  107* 94 88  BUN 75* 75*  --  77* 75* 79*  CREATININE 5.24* 5.01* 5.25* 5.24* 5.50* 5.74*  CALCIUM  8.4* 8.7*  --  8.6* 8.5* 8.4*  PHOS  --   --  5.8*  --   --   --    CBC Recent Labs  Lab 12/02/23 0752 12/03/23 0320 12/04/23 0238 12/05/23 0224  WBC 5.3 4.0 4.9 4.7  HGB 7.7* 6.8* 7.8* 7.3*  HCT 24.6* 21.3* 23.8* 22.5*  MCV 96.5 95.1 94.1 95.3  PLT 240 220 222 227    Medications:     sodium chloride    Intravenous Once   amLODipine   10 mg Oral Daily   budesonide -glycopyrrolate -formoterol   2 puff Inhalation BID   carvedilol   12.5 mg Oral BID   citalopram   10 mg Oral Daily   cyanocobalamin   1,000 mcg Oral Daily   darbepoetin (ARANESP ) injection - DIALYSIS  60 mcg Subcutaneous Q Tue-1800   ferrous sulfate   325 mg Oral Q breakfast   furosemide   80 mg Intravenous TID   hydrALAZINE   100 mg Oral TID  montelukast   10 mg Oral Daily   sevelamer  carbonate  800 mg Oral TID WC   sodium bicarbonate   650 mg Oral BID   Gordy Blanch, MD 12/05/2023, 8:10 AM

## 2023-12-05 NOTE — Progress Notes (Signed)
 Mobility Specialist Progress Note:    12/05/23 1455  Mobility  Activity Ambulated with assistance in room;Transferred from bed to chair  Level of Assistance Other (Comment) (HHA)  Assistive Device None  Distance Ambulated (ft) 25 ft  Activity Response Tolerated fair  Mobility Referral Yes  Mobility visit 1 Mobility  Mobility Specialist Start Time (ACUTE ONLY) 1455  Mobility Specialist Stop Time (ACUTE ONLY) 1510  Mobility Specialist Time Calculation (min) (ACUTE ONLY) 15 min   Pt agreeable to session. Pt ambulating from bed to door then door to chair using HHA. No c/o any symptoms but seemed a little weaker than previous session. Left pt in chair w/ all needs met w/ daughter.  Venetia Keel Mobility Specialist Please Neurosurgeon or Rehab Office at [phone]

## 2023-12-05 NOTE — Progress Notes (Addendum)
 PROGRESS NOTE    [name]  FMW:[id number] DOB: May 03, 1934 DOA: 12/02/2023 PCP: Charlett Apolinar POUR, MD  89/F w/ chronic combined CHF, CKD 5, anxiety, GERD, hypertension, dyslipidemia presented to the ED with worsening dyspnea and edema for 2 to 3 days. -Reports compliance with diuretics - In the ER hypertensive, creatinine 5.01, BUN 75, bicarb 17, hemoglobin 7.4, BNP 95, chest x-ray with cardiomegaly and interstitial edema, small effusions - Admitted, started on diuretics, nephrology following, seen by palliative care as well - Improving on diuretics, conservative management only  Subjective: - Feels better, breathing is slowly improving, walked with help yesterday  Assessment and Plan:  Volume overload Acute on chronic combined systolic and diastolic CHF CKD stage V: - Improving with diuresis, urine output is inaccurate - Nephrology following, plan to continue IV Lasix  1 more day, metolazone  today, she is not a candidate for dialysis, appreciate nephrology input, patient is also reluctant to consider this -GDMT limited by CKD 5 -Palliative consulted, patient wishes for full code and full scope> she has limited understanding however daughter understands decision for no HD, will need hospice when she is ESRD - PT OT, TOC following   Hypertensive urgency: -continue Coreg , hydralazine , amlodipine , clonidine    Elevated troponin:  -Likely in the setting of fluid overload.  Patient denies any chest pain.  Continue to monitor on telemetry   Anemia of CKD: - Baseline hemoglobin between 8-10.   - Hemoglobin lower this admission, no active bleeding, transfuse 1 unit PRBC few days ago, hemoglobin 7.3 today, will transfuse 1 more unit  - Anemia panel suggestive of chronic disease, given Epo   Moderate persistent asthma: - Continue home inhalers.    Depression/anxiety/insomnia:  -Continue Celexa  and Ativan  as needed   Mild dementia, cognitive deficits -Noted   DVT prophylaxis:  Heparin  Code Status: Full code Family Communication: no fam at bedside, left msg for granddaughter [name] Disposition Plan: , PT OT recommending HH  Consultants:    Procedures:   Antimicrobials:    Objective: Vitals:   12/05/23 0729 12/05/23 0807 12/05/23 0850 12/05/23 1054  BP: (!) 152/60 (!) 152/57 (!) 156/67 (!) 139/53  Pulse: 68 70 67 66  Resp: 17 18 18 18   Temp: 98.2 F (36.8 C) 98.1 F (36.7 C) 97.9 F (36.6 C) 98.1 F (36.7 C)  TempSrc: Oral Oral  Oral  SpO2: 99% 100%  100%  Weight:      Height:        Intake/Output Summary (Last 24 hours) at 12/05/2023 1119 Last data filed at 12/05/2023 1052 Gross per 24 hour  Intake 914 ml  Output 1050 ml  Net -136 ml   Filed Weights   12/03/23 0404 12/04/23 0343 12/05/23 0031  Weight: 61.5 kg 62 kg 60.9 kg    Examination:  General exam: Appears calm and comfortable, pleasant HEENT: Positive JVD Respiratory system: Few basilar rales Cardiovascular system: S1 & S2 heard, RRR.  Abd: nondistended, soft and nontender.Normal bowel sounds heard. Central nervous system: Alert and oriented. No focal neurological deficits. Extremities: 1 plus edema Skin: No rashes Psychiatry:  Mood & affect appropriate.     Data Reviewed:   CBC: Recent Labs  Lab 12/02/23 0230 12/02/23 0752 12/03/23 0320 12/04/23 0238 12/05/23 0224  WBC 6.0 5.3 4.0 4.9 4.7  HGB 7.4* 7.7* 6.8* 7.8* 7.3*  HCT 23.7* 24.6* 21.3* 23.8* 22.5*  MCV 97.1 96.5 95.1 94.1 95.3  PLT 215 240 220 222 227   Basic Metabolic Panel: Recent Labs  Lab  11/29/23 2354 12/02/23 0230 12/02/23 0752 12/03/23 0320 12/04/23 0238 12/05/23 0224  NA 140 138  --  137 136 138  K 5.0 4.8  --  5.0 5.1 4.8  CL 109 108  --  107 107 106  CO2 18* 17*  --  19* 17* 19*  GLUCOSE 123* 117*  --  107* 94 88  BUN 75* 75*  --  77* 75* 79*  CREATININE 5.24* 5.01* 5.25* 5.24* 5.50* 5.74*  CALCIUM  8.4* 8.7*  --  8.6* 8.5* 8.4*  MG  --   --  2.1 2.0  --   --   PHOS  --   --  5.8*   --   --   --    GFR: Estimated Creatinine Clearance: 5.7 mL/min (A) (by C-G formula based on SCr of 5.74 mg/dL (H)). Liver Function Tests: No results for input(s): AST, ALT, ALKPHOS, BILITOT, PROT, ALBUMIN  in the last 168 hours. No results for input(s): LIPASE, AMYLASE in the last 168 hours. No results for input(s): AMMONIA in the last 168 hours. Coagulation Profile: Recent Labs  Lab 11/29/23 2354  INR 1.0   Cardiac Enzymes: No results for input(s): CKTOTAL, CKMB, CKMBINDEX, TROPONINI in the last 168 hours. BNP (last 3 results) Recent Labs    09/22/23 0036  PROBNP >35,000.0*   HbA1C: No results for input(s): HGBA1C in the last 72 hours. CBG: No results for input(s): GLUCAP in the last 168 hours. Lipid Profile: No results for input(s): CHOL, HDL, LDLCALC, TRIG, CHOLHDL, LDLDIRECT in the last 72 hours. Thyroid  Function Tests: No results for input(s): TSH, T4TOTAL, FREET4, T3FREE, THYROIDAB in the last 72 hours.  Anemia Panel: Recent Labs    12/03/23 1104  VITAMINB12 2,283*  FOLATE 10.8  FERRITIN 371*  TIBC 174*  IRON 67  RETICCTPCT 1.5   Urine analysis:    Component Value Date/Time   COLORURINE YELLOW 10/14/2023 1720   APPEARANCEUR CLEAR 10/14/2023 1720   LABSPEC 1.013 10/14/2023 1720   PHURINE 6.0 10/14/2023 1720   GLUCOSEU 50 (A) 10/14/2023 1720   GLUCOSEU NEGATIVE 01/16/2023 1238   HGBUR NEGATIVE 10/14/2023 1720   BILIRUBINUR NEGATIVE 10/14/2023 1720   KETONESUR NEGATIVE 10/14/2023 1720   PROTEINUR >=300 (A) 10/14/2023 1720   UROBILINOGEN 0.2 01/16/2023 1238   NITRITE NEGATIVE 10/14/2023 1720   LEUKOCYTESUR NEGATIVE 10/14/2023 1720   Sepsis Labs: @LABRCNTIP (procalcitonin:4,lacticidven:4)  )No results found for this or any previous visit (from the past 240 hours).   Radiology Studies: No results found.    Scheduled Meds:  amLODipine   10 mg Oral Daily   budesonide -glycopyrrolate -formoterol   2  puff Inhalation BID   carvedilol   12.5 mg Oral BID   citalopram   10 mg Oral Daily   cyanocobalamin   1,000 mcg Oral Daily   darbepoetin (ARANESP ) injection - DIALYSIS  60 mcg Subcutaneous Q Tue-1800   ferrous sulfate   325 mg Oral Q breakfast   furosemide   80 mg Intravenous TID   hydrALAZINE   100 mg Oral TID   montelukast   10 mg Oral Daily   sevelamer  carbonate  800 mg Oral TID WC   sodium bicarbonate   650 mg Oral BID   Continuous Infusions:   LOS: 3 days    Time spent:    Sigurd Pac, MD [facility]   12/05/2023, 11:19 AM

## 2023-12-06 ENCOUNTER — Ambulatory Visit: Admitting: Physician Assistant

## 2023-12-06 DIAGNOSIS — E8779 Other fluid overload: Secondary | ICD-10-CM | POA: Diagnosis not present

## 2023-12-06 DIAGNOSIS — R0602 Shortness of breath: Secondary | ICD-10-CM

## 2023-12-06 DIAGNOSIS — I509 Heart failure, unspecified: Secondary | ICD-10-CM | POA: Diagnosis not present

## 2023-12-06 LAB — TYPE AND SCREEN
ABO/RH(D): B POS
Antibody Screen: NEGATIVE
Unit division: 0
Unit division: 0

## 2023-12-06 LAB — BASIC METABOLIC PANEL WITH GFR
Anion gap: 14 (ref 5–15)
BUN: 78 mg/dL — ABNORMAL HIGH (ref 8–23)
CO2: 20 mmol/L — ABNORMAL LOW (ref 22–32)
Calcium: 8.3 mg/dL — ABNORMAL LOW (ref 8.9–10.3)
Chloride: 104 mmol/L (ref 98–111)
Creatinine, Ser: 5.74 mg/dL — ABNORMAL HIGH (ref 0.44–1.00)
GFR, Estimated: 7 mL/min — ABNORMAL LOW (ref >60)
Glucose, Bld: 101 mg/dL — ABNORMAL HIGH (ref 70–99)
Potassium: 4.7 mmol/L (ref 3.5–5.1)
Sodium: 138 mmol/L (ref 135–145)

## 2023-12-06 LAB — CBC
HCT: 30.2 % — ABNORMAL LOW (ref 36.0–46.0)
Hemoglobin: 9.8 g/dL — ABNORMAL LOW (ref 12.0–15.0)
MCH: 30.2 pg (ref 26.0–34.0)
MCHC: 32.5 g/dL (ref 30.0–36.0)
MCV: 93.2 fL (ref 80.0–100.0)
Platelets: 232 K/uL (ref 150–400)
RBC: 3.24 MIL/uL — ABNORMAL LOW (ref 3.87–5.11)
RDW: 16 % — ABNORMAL HIGH (ref 11.5–15.5)
WBC: 5.7 K/uL (ref 4.0–10.5)
nRBC: 0 % (ref 0.0–0.2)

## 2023-12-06 LAB — BPAM RBC
Blood Product Expiration Date: 202508162359
Blood Product Expiration Date: 202508172359
ISSUE DATE / TIME: 202507211143
ISSUE DATE / TIME: 202507230821
Unit Type and Rh: 7300
Unit Type and Rh: 7300

## 2023-12-06 MED ORDER — DOXAZOSIN MESYLATE 1 MG PO TABS
1.0000 mg | ORAL_TABLET | Freq: Every day | ORAL | Status: DC
  Administered 2023-12-06 – 2023-12-09 (×4): 1 mg via ORAL
  Filled 2023-12-06 (×5): qty 1

## 2023-12-06 MED ORDER — TORSEMIDE 20 MG PO TABS
40.0000 mg | ORAL_TABLET | Freq: Two times a day (BID) | ORAL | Status: DC
  Administered 2023-12-06 (×2): 40 mg via ORAL
  Filled 2023-12-06 (×3): qty 2

## 2023-12-06 NOTE — Progress Notes (Signed)
 [facility]  PROGRESS NOTE  [name] FMW:[id number] DOB: [birth year] DOA: 12/02/2023 PCP: Angela Apolinar POUR, MD   Brief HPI:   89/F w/ chronic combined CHF, CKD 5, anxiety, GERD, hypertension, dyslipidemia presented to the ED with worsening dyspnea and edema for 2 to 3 days. -Reports compliance with diuretics - In the ER hypertensive, creatinine 5.01, BUN 75, bicarb 17, hemoglobin 7.4, BNP 95, chest x-ray with cardiomegaly and interstitial edema, small effusions - Admitted, started on diuretics, nephrology following, seen by palliative care as well - Improving on diuretics, conservative management only      Assessment/Plan:    Acute on chronic combined systolic and diastolic CHF CKD stage V: - Significantly improved. -Nephrology managing diuretics, IV Lasix  has been changed to p.o. torsemide  40 mg p.o. twice daily -Not a candidate for dialysis. -GDMT limited by CKD 5 -Palliative consulted, patient wishes for full code and full scope> she has limited understanding however daughter understands decision for no HD, will need hospice when she is ESRD - PT OT, TOC following   Hypertensive urgency: -Resolved -continue Coreg , hydralazine , amlodipine , clonidine    Elevated troponin:  -Likely from demand ischemia -Likely in the setting of fluid overload.  Patient denies any chest pain.  Continue to monitor on telemetry   Anemia of CKD: - Baseline hemoglobin between 8-10.   - Hemoglobin lower this admission, no active bleeding, transfuse 1 unit PRBC few days ago, hemoglobin 7.3 today, will transfuse 1 more unit  - Anemia panel suggestive of chronic disease, given Epo   Moderate persistent asthma: - Continue home inhalers.    Depression/anxiety/insomnia:  -Continue Celexa  and Ativan  as needed   Mild dementia, cognitive deficits -Noted    Medications     amLODipine   10 mg Oral Daily   budesonide -glycopyrrolate -formoterol   2 puff Inhalation BID   carvedilol   12.5  mg Oral BID   citalopram   10 mg Oral Daily   cyanocobalamin   1,000 mcg Oral Daily   darbepoetin (ARANESP ) injection - DIALYSIS  60 mcg Subcutaneous Q Tue-1800   doxazosin   1 mg Oral QHS   ferrous sulfate   325 mg Oral Q breakfast   hydrALAZINE   100 mg Oral TID   mirtazapine   7.5 mg Oral QHS   montelukast   10 mg Oral Daily   sevelamer  carbonate  800 mg Oral TID WC   sodium bicarbonate   650 mg Oral BID   torsemide   40 mg Oral BID     Data Reviewed:   CBG:  No results for input(s): GLUCAP in the last 168 hours.  SpO2: 98 %    Vitals:   12/06/23 0513 12/06/23 0727 12/06/23 0800 12/06/23 1110  BP: (!) 172/71  (!) 155/58 (!) 145/74  Pulse: 69  63 60  Resp:   18 18  Temp: 98.5 F (36.9 C)  98.1 F (36.7 C) 97.8 F (36.6 C)  TempSrc: Oral  Oral Oral  SpO2: 97% 97% 97% 98%  Weight:      Height:          Data Reviewed:  Basic Metabolic Panel: Recent Labs  Lab 12/02/23 0230 12/02/23 0752 12/03/23 0320 12/04/23 0238 12/05/23 0224 12/06/23 0242  NA 138  --  137 136 138 138  K 4.8  --  5.0 5.1 4.8 4.7  CL 108  --  107 107 106 104  CO2 17*  --  19* 17* 19* 20*  GLUCOSE 117*  --  107* 94 88 101*  BUN 75*  --  77* 75* 79* 78*  CREATININE 5.01* 5.25* 5.24* 5.50* 5.74* 5.74*  CALCIUM  8.7*  --  8.6* 8.5* 8.4* 8.3*  MG  --  2.1 2.0  --   --   --   PHOS  --  5.8*  --   --   --   --     CBC: Recent Labs  Lab 12/03/23 0320 12/04/23 0238 12/05/23 0224 12/05/23 1449 12/06/23 0242  WBC 4.0 4.9 4.7 5.4 5.7  HGB 6.8* 7.8* 7.3* 9.0* 9.8*  HCT 21.3* 23.8* 22.5* 27.2* 30.2*  MCV 95.1 94.1 95.3 94.1 93.2  PLT 220 222 227 219 232    LFT No results for input(s): AST, ALT, ALKPHOS, BILITOT, PROT, ALBUMIN  in the last 168 hours.   Antibiotics: Anti-infectives (From admission, onward)    None        DVT prophylaxis: SCDs  Code Status: Full code  Family Communication:    CONSULTS nephrology   Subjective   Wants to go home   Objective     Physical Examination:   General-appears in no acute distress Heart-S1-S2, regular, no murmur auscultated Lungs-clear to auscultation bilaterally, no wheezing or crackles auscultated Abdomen-soft, nontender, no organomegaly Extremities-trace  edema in the lower extremities Neuro-alert, oriented x3, no focal deficit noted   Status is: Inpatient:             [name]   [facility] If 7PM-7AM, please contact night-coverage at [email or web address], Office  [phone]   12/06/2023, 2:25 PM  LOS: 4 days

## 2023-12-06 NOTE — Plan of Care (Signed)
  Problem: Clinical Measurements: Goal: Will remain free from infection Outcome: Progressing   Problem: Clinical Measurements: Goal: Respiratory complications will improve Outcome: Progressing   Problem: Activity: Goal: Risk for activity intolerance will decrease Outcome: Progressing   

## 2023-12-06 NOTE — Progress Notes (Signed)
 Patient ID: [name], female   DOB: 1934/01/24, 89 y.o.   MRN: [id number] [facility] Progress Note   Assessment/ Plan:   1.  Acute kidney injury on chronic kidney disease stage V: Appears cardiorenal in mechanism with volume overload/CHF decompensation secondary to diuretic nonadherence.  Continues to improve clinically with resolution of orthopnea over the last 24 hours; weight down by 0.4 kg.  Will transition to oral diuretics today to position her for possible discharge tomorrow if labs remain stable. 2.  Acute exacerbation of congestive heart failure: With previous 2D echo evidence of combined systolic and diastolic congestive heart failure and decompensation secondary to diuretic nonadherence.  Transition to oral diuretics today. 3.  Hypertension: Elevated blood pressures likely secondary to volume status/CHF decompensation.  Restarted torsemide  40 mg twice daily and begin doxazosin  1 mg nightly.  Attempt to wean off of clonidine . 4.  Anemia: Without overt blood loss and appears to be secondary to chronic illness including chronic kidney disease.  Hemoglobin improved status post PRBC transfusion, on ESA. 5.  Bronchial asthma: Resume bronchodilator therapy/home inhalers per primary service.  Subjective:   Reports to be feeling much better today with resolution of orthopnea.   Objective:   BP (!) 172/71 (BP Location: Left Arm)   Pulse 69   Temp 98.5 F (36.9 C) (Oral)   Resp 20   Ht 5' 3 (1.6 m)   Wt 60.5 kg   SpO2 97%   BMI 23.63 kg/m   Intake/Output Summary (Last 24 hours) at 12/06/2023 0806 Last data filed at 12/06/2023 0511 Gross per 24 hour  Intake 851 ml  Output 1150 ml  Net -299 ml   Weight change: -0.408 kg  Physical Exam: Gen: She appears comfortable sitting up in bed CVS: Pulse regular rhythm, normal rate, S1 and S2 normal Resp: Poor inspiratory effort with decreased breath sounds over bases.  No distinct rales/wheeze Abd: Soft, flat,  nontender, bowel sounds normal Ext: Trace edema of the lower extremities, wearing compression stockings  Imaging: No results found.  Labs: BMET Recent Labs  Lab 11/29/23 2354 12/02/23 0230 12/02/23 0752 12/03/23 0320 12/04/23 0238 12/05/23 0224 12/06/23 0242  NA 140 138  --  137 136 138 138  K 5.0 4.8  --  5.0 5.1 4.8 4.7  CL 109 108  --  107 107 106 104  CO2 18* 17*  --  19* 17* 19* 20*  GLUCOSE 123* 117*  --  107* 94 88 101*  BUN 75* 75*  --  77* 75* 79* 78*  CREATININE 5.24* 5.01* 5.25* 5.24* 5.50* 5.74* 5.74*  CALCIUM  8.4* 8.7*  --  8.6* 8.5* 8.4* 8.3*  PHOS  --   --  5.8*  --   --   --   --    CBC Recent Labs  Lab 12/04/23 0238 12/05/23 0224 12/05/23 1449 12/06/23 0242  WBC 4.9 4.7 5.4 5.7  HGB 7.8* 7.3* 9.0* 9.8*  HCT 23.8* 22.5* 27.2* 30.2*  MCV 94.1 95.3 94.1 93.2  PLT 222 227 219 232    Medications:     amLODipine   10 mg Oral Daily   budesonide -glycopyrrolate -formoterol   2 puff Inhalation BID   carvedilol   12.5 mg Oral BID   citalopram   10 mg Oral Daily   cyanocobalamin   1,000 mcg Oral Daily   darbepoetin (ARANESP ) injection - DIALYSIS  60 mcg Subcutaneous Q Tue-1800   ferrous sulfate   325 mg Oral Q breakfast   furosemide   80 mg Intravenous  TID   hydrALAZINE   100 mg Oral TID   mirtazapine   7.5 mg Oral QHS   montelukast   10 mg Oral Daily   sevelamer  carbonate  800 mg Oral TID WC   sodium bicarbonate   650 mg Oral BID   Gordy Blanch, MD 12/06/2023, 8:06 AM

## 2023-12-06 NOTE — Plan of Care (Signed)
   Problem: Clinical Measurements: Goal: Ability to maintain clinical measurements within normal limits will improve Outcome: Progressing

## 2023-12-06 NOTE — Progress Notes (Signed)
 Mobility Specialist Progress Note:    12/06/23 1105  Mobility  Activity Ambulated with assistance in room;Transferred from bed to chair  Level of Assistance Other (Comment) (HHA)  Assistive Device None  Distance Ambulated (ft) 10 ft  Activity Response Tolerated well  Mobility Referral Yes  Mobility visit 1 Mobility  Mobility Specialist Start Time (ACUTE ONLY) 1105  Mobility Specialist Stop Time (ACUTE ONLY) 1125  Mobility Specialist Time Calculation (min) (ACUTE ONLY) 20 min   Pt tired but agreeable to session. Pt manage to walk around be to recliner w/ HHA and solid steps. Pt feels like she is more tired recently d/t lack of sleep. C/o headache (notified RN). Left pt in recliner w/ all needs met.   Venetia Keel Mobility Specialist Please Neurosurgeon or Rehab Office at [phone]

## 2023-12-06 NOTE — Progress Notes (Signed)
 This chaplain left a HIPAA compliant VM for the Pt. granddaughter-Shamika. The chaplain attempted to F/U on Shamika's request for Pt. HCPOA assistance.  Chaplain Leeroy Hummer [phone]

## 2023-12-07 ENCOUNTER — Encounter: Payer: Self-pay | Admitting: Hematology

## 2023-12-07 DIAGNOSIS — N186 End stage renal disease: Secondary | ICD-10-CM

## 2023-12-07 LAB — BASIC METABOLIC PANEL WITH GFR
Anion gap: 15 (ref 5–15)
BUN: 83 mg/dL — ABNORMAL HIGH (ref 8–23)
CO2: 19 mmol/L — ABNORMAL LOW (ref 22–32)
Calcium: 8.4 mg/dL — ABNORMAL LOW (ref 8.9–10.3)
Chloride: 103 mmol/L (ref 98–111)
Creatinine, Ser: 6.25 mg/dL — ABNORMAL HIGH (ref 0.44–1.00)
GFR, Estimated: 6 mL/min — ABNORMAL LOW
Glucose, Bld: 99 mg/dL (ref 70–99)
Potassium: 4.6 mmol/L (ref 3.5–5.1)
Sodium: 137 mmol/L (ref 135–145)

## 2023-12-07 MED ORDER — TORSEMIDE 20 MG PO TABS
40.0000 mg | ORAL_TABLET | Freq: Every day | ORAL | Status: DC
  Administered 2023-12-08 – 2023-12-10 (×3): 40 mg via ORAL
  Filled 2023-12-07 (×3): qty 2

## 2023-12-07 MED ORDER — ALBUMIN HUMAN 25 % IV SOLN
12.5000 g | Freq: Once | INTRAVENOUS | Status: AC
  Administered 2023-12-07: 12.5 g via INTRAVENOUS
  Filled 2023-12-07: qty 50

## 2023-12-07 MED ORDER — FUROSEMIDE 10 MG/ML IJ SOLN
80.0000 mg | Freq: Once | INTRAMUSCULAR | Status: AC
  Administered 2023-12-07: 80 mg via INTRAVENOUS
  Filled 2023-12-07: qty 8

## 2023-12-07 NOTE — Progress Notes (Signed)
 Occupational Therapy Treatment Patient Details Name: [name] MRN: [id number] DOB: 1933/06/13 Today's Date: 12/07/2023   History of present illness 89 y.o. female adm 12/02/23 with SOB, LB edema, acute on chronic CHF. PMHx: HTN, HLD, asthma, CKD, T2DM, lupus, GERD, anxiety, CHF, anemia   OT comments   Pt progressing well towards goals. Progressed to complete standing ADLs at supervision level. Pt requiring cues for safety with RW, tendency to forget and attempt to mobilize without RW. Pt presenting with poor situational awareness but improved cog from initial eval. Pt continues to be limited by activity tolerance and poor safety awareness. Updated d/c recs to [facility] to improve functional independence in the home setting and promote safety upon d/c. Will continue to follow acutely.       If plan is discharge home, recommend the following:  A little help with walking and/or transfers;A little help with bathing/dressing/bathroom   Equipment Recommendations  BSC/3in1    Recommendations for Other Services      Precautions / Restrictions Precautions Precautions: Fall Recall of Precautions/Restrictions: Impaired Restrictions Weight Bearing Restrictions Per Provider Order: No       Mobility Bed Mobility   General bed mobility comments: Received in recliner    Transfers Overall transfer level: Needs assistance Equipment used: Rolling walker (2 wheels) Transfers: Sit to/from Stand, Bed to chair/wheelchair/BSC Sit to Stand: Contact guard assist     Step pivot transfers: Supervision     General transfer comment: CGA for balance, slow to come to stand. Once in standing s for safety with RW, pt with tendency to leave behind     Balance Overall balance assessment: Needs assistance Sitting-balance support: No upper extremity supported, Feet supported Sitting balance-Leahy Scale: Fair     Standing balance support: No upper extremity supported, Bilateral upper extremity  supported, Reliant on assistive device for balance Standing balance-Leahy Scale: Fair Standing balance comment: Benefits from use of RW         ADL either performed or assessed with clinical judgement   ADL Overall ADL's : Needs assistance/impaired     Grooming: Wash/dry hands;Wash/dry face;Oral care;Supervision/safety Grooming Details (indicate cue type and reason): s for safety with RW, cues for positioning       Toilet Transfer: Supervision/safety;Cueing for safety;Cueing for sequencing;Ambulation;Rolling walker (2 wheels);BSC/3in1 Toilet Transfer Details (indicate cue type and reason): S for safety, cueing for sequencing Toileting- Clothing Manipulation and Hygiene: Contact guard assist;Sit to/from stand Toileting - Clothing Manipulation Details (indicate cue type and reason): CGA for balance     Functional mobility during ADLs: Supervision/safety;Contact guard assist;Rolling walker (2 wheels) General ADL Comments: Cues for safety    Extremity/Trunk Assessment Upper Extremity Assessment Upper Extremity Assessment: Overall WFL for tasks assessed   Lower Extremity Assessment Lower Extremity Assessment: Defer to PT evaluation        Vision   Vision Assessment?: No apparent visual deficits         Communication Communication Communication: No apparent difficulties   Cognition Arousal: Alert Behavior During Therapy: Flat affect Cognition: Cognition impaired, No family/caregiver present to determine baseline   Orientation impairments: Situation Awareness: Online awareness impaired, Intellectual awareness intact   Attention impairment (select first level of impairment): Sustained attention Executive functioning impairment (select all impairments): Problem solving OT - Cognition Comments: Decreased situational awareness     Following commands: Intact        Cueing   Cueing Techniques: Verbal cues        General Comments VSS on RA  Pertinent Vitals/  Pain       Pain Assessment Pain Assessment: No/denies pain   Frequency  Min 2X/week        Progress Toward Goals  OT Goals(current goals can now be found in the care plan section)  Progress towards OT goals: Progressing toward goals  Acute Rehab OT Goals Patient Stated Goal: None stated OT Goal Formulation: Patient unable to participate in goal setting Time For Goal Achievement: 12/18/23 Potential to Achieve Goals: Good ADL Goals Pt Will Perform Lower Body Dressing: with modified independence;sit to/from stand Pt Will Transfer to Toilet: with modified independence;ambulating;regular height toilet Additional ADL Goal #1: pt will complete oral care with dentures setup without cues for sequence  Plan         AM-PAC OT 6 Clicks Daily Activity     Outcome Measure   Help from another person eating meals?: None Help from another person taking care of personal grooming?: A Little Help from another person toileting, which includes using toliet, bedpan, or urinal?: A Little Help from another person bathing (including washing, rinsing, drying)?: A Little Help from another person to put on and taking off regular upper body clothing?: A Little Help from another person to put on and taking off regular lower body clothing?: A Little 6 Click Score: 19    End of Session Equipment Utilized During Treatment: Rolling walker (2 wheels)  OT Visit Diagnosis: Unsteadiness on feet (R26.81);Muscle weakness (generalized) (M62.81)   Activity Tolerance Patient tolerated treatment well   Patient Left in chair;with call bell/phone within reach;with chair alarm set;with family/visitor present   Nurse Communication Mobility status;Precautions        Time: 1530-1556 OT Time Calculation (min): 26 min  Charges: OT General Charges $OT Visit: 1 Visit OT Treatments $Self Care/Home Management : 23-37 mins  Adrianne BROCKS, OT  Acute Rehabilitation Services Office [phone] Secure chat  preferred   Adrianne GORMAN Savers 12/07/2023, 4:28 PM

## 2023-12-07 NOTE — TOC Progression Note (Addendum)
 Transition of Care (TOC) - Progression Note   From previous TOC note. Patient from home with [facility] [phone].   Was active with [facility] for HHPT/OT prior to admission. Confirmed with Burnard with [facility]. Secure chatted MD for orders and face to face.   Per chart patient has bedside commode and cane at home. Needs rolling walker.   Called  [facility] [phone] , await call back to discuss above and assistance at home.   HAve not heard back from [facility] , however per palliative note they are going to met again tomorrow and discuss hospice  Patient Details  Name: [name] MRN: [id number] Date of Birth: 12/20/1933  Transition of [facility]) CM/SW Contact  Laramie Meissner, Powell Jansky, RN Phone Number: 12/07/2023, 11:45 AM  Clinical Narrative:       Expected Discharge Plan: Home w Home Health Services Barriers to Discharge: Continued Medical Work up               Expected Discharge Plan and Services In-house Referral: NA Discharge Planning Services: CM Consult Post Acute Care Choice: Home Health, Resumption of Svcs/PTA Provider Living arrangements for the past 2 months: Single Family Home                 DME Arranged: N/A DME Agency: NA       HH Arranged: RN, PT, Disease Management HH Agency: [facility] Date HH Agency Contacted: 12/03/23   Representative spoke with at [facility] Agency: Burnard   Social Drivers of Health (SDOH) Interventions SDOH Screenings   Food Insecurity: No Food Insecurity (12/02/2023)  Housing: Low Risk  (12/02/2023)  Transportation Needs: No Transportation Needs (12/02/2023)  Utilities: Not At Risk (12/02/2023)  Alcohol  Screen: Low Risk  (09/21/2023)  Depression (PHQ2-9): Low Risk  (09/21/2023)  Financial Resource Strain: Low Risk  (09/21/2023)  Physical Activity: Sufficiently Active (09/21/2023)  Social Connections: Moderately Isolated (12/02/2023)  Stress: No Stress Concern Present (09/21/2023)  Tobacco Use: Low Risk   (12/02/2023)  Health Literacy: Adequate Health Literacy (09/21/2023)    Readmission Risk Interventions    12/03/2023   11:27 AM 10/24/2023    2:20 PM 10/16/2023    3:51 PM  Readmission Risk Prevention Plan  Transportation Screening Complete Complete Complete  Medication Review Oceanographer) Complete Complete Complete  PCP or Specialist appointment within 3-5 days of discharge Complete Complete Complete  HRI or Home Care Consult Complete Complete Complete  SW Recovery Care/Counseling Consult  Complete Complete  Palliative Care Screening Not Applicable  Not Applicable  Skilled Nursing Facility Not Applicable Complete Complete

## 2023-12-07 NOTE — Progress Notes (Signed)
 Physical Therapy Treatment Patient Details Name: [name] MRN: [id number] DOB: 1933/08/04 Today's Date: 12/07/2023   History of Present Illness 89 y.o. female adm 12/02/23 with SOB, LB edema, acute on chronic CHF. PMHx: HTN, HLD, asthma, CKD, T2DM, lupus, GERD, anxiety, CHF, anemia    PT Comments  Pt remains pleasantly confused with improved activity tolerance with use of RW this session. Pt with noted RUE edema this session with RN made aware and ID bracelet changed to prevent constriction. Pt educated for HEP, progressive mobility and benefit of RW. Pt limited by cognition and requires 24hr supervision, family not present to confirm assist and recommend HHPT with ALF long term.     If plan is discharge home, recommend the following: A little help with walking and/or transfers;A little help with bathing/dressing/bathroom;Assistance with cooking/housework;Assist for transportation;Supervision due to cognitive status;Direct supervision/assist for medications management;Direct supervision/assist for financial management;Help with stairs or ramp for entrance   Can travel by private vehicle        Equipment Recommendations  Rolling walker (2 wheels)    Recommendations for Other Services       Precautions / Restrictions Precautions Precautions: Fall Recall of Precautions/Restrictions: Impaired     Mobility  Bed Mobility Overal bed mobility: Modified Independent             General bed mobility comments: pt able to pivot to EOB with use of rail    Transfers Overall transfer level: Needs assistance   Transfers: Sit to/from Stand Sit to Stand: Contact guard assist           General transfer comment: CGA for safety with cues for hand placement to rise from bed and toilet with rail, and recliner with armrests. Pt performed 5 repeated sit to stands from chair with cues in grossly 1 min    Ambulation/Gait Ambulation/Gait assistance: Contact guard assist Gait Distance  (Feet): 200 Feet Assistive device: Rolling walker (2 wheels) Gait Pattern/deviations: Step-through pattern, Decreased stride length   Gait velocity interpretation: 1.31 - 2.62 ft/sec, indicative of limited community ambulator   General Gait Details: cues for proximity to RW and direction with pt tolerating well without SOB   Stairs             Wheelchair Mobility     Tilt Bed    Modified Rankin (Stroke Patients Only)       Balance Overall balance assessment: Needs assistance Sitting-balance support: No upper extremity supported, Feet supported Sitting balance-Leahy Scale: Fair     Standing balance support: No upper extremity supported, Bilateral upper extremity supported, Reliant on assistive device for balance Standing balance-Leahy Scale: Fair Standing balance comment: static standing at sink to wash hands, RW for gait                            Communication Communication Communication: No apparent difficulties  Cognition Arousal: Alert Behavior During Therapy: Flat affect   PT - Cognitive impairments: No family/caregiver present to determine baseline, Memory, Orientation   Orientation impairments: Place, Situation, Time                   PT - Cognition Comments: pt oriented to hospital unable to recall room number or find it with cues, unaware of breakfast arrival. Following commands: Intact      Cueing Cueing Techniques: Verbal cues  Exercises General Exercises - Lower Extremity Long Arc Quad: AROM, Both, 20 reps, Seated Hip ABduction/ADduction: AROM, Both,  20 reps, Seated Hip Flexion/Marching: AROM, Both, 20 reps, Seated    General Comments        Pertinent Vitals/Pain Pain Assessment Pain Assessment: No/denies pain    Home Living                          Prior Function            PT Goals (current goals can now be found in the care plan section) Progress towards PT goals: Progressing toward goals     Frequency    Min 1X/week      PT Plan      Co-evaluation              AM-PAC PT 6 Clicks Mobility   Outcome Measure  Help needed turning from your back to your side while in a flat bed without using bedrails?: None Help needed moving from lying on your back to sitting on the side of a flat bed without using bedrails?: A Little Help needed moving to and from a bed to a chair (including a wheelchair)?: A Little Help needed standing up from a chair using your arms (e.g., wheelchair or bedside chair)?: A Little Help needed to walk in hospital room?: A Little Help needed climbing 3-5 steps with a railing? : A Lot 6 Click Score: 18    End of Session   Activity Tolerance: Patient tolerated treatment well Patient left: in chair;with call bell/phone within reach;with chair alarm set Nurse Communication: Mobility status PT Visit Diagnosis: Other abnormalities of gait and mobility (R26.89);Muscle weakness (generalized) (M62.81)     Time: 9199-9173 PT Time Calculation (min) (ACUTE ONLY): 26 min  Charges:    $Gait Training: 8-22 mins $Therapeutic Activity: 8-22 mins PT General Charges $$ ACUTE PT VISIT: 1 Visit                     [name], PT Acute Rehabilitation Services Office: [phone]    [name] 12/07/2023, 10:05 AM

## 2023-12-07 NOTE — Plan of Care (Signed)
   Problem: Health Behavior/Discharge Planning: Goal: Ability to manage health-related needs will improve Outcome: Progressing   Problem: Clinical Measurements: Goal: Ability to maintain clinical measurements within normal limits will improve Outcome: Progressing   Problem: Clinical Measurements: Goal: Will remain free from infection Outcome: Progressing

## 2023-12-07 NOTE — Progress Notes (Signed)
 Patient ID: [name], female   DOB: [birth year], 89 y.o.   MRN: [id number] [facility] Progress Note   Assessment/ Plan:   1.  Acute kidney injury on chronic kidney disease stage V: Appears cardiorenal in mechanism with volume overload/CHF decompensation secondary to diuretic nonadherence.  Transitioned to oral diuretics yesterday with some worsening of renal function seen on labs this morning.  Urine output appears to be fixed and her weight is unchanged.  Paradoxically with some worsening right arm swelling ipsilateral to antecubital IV access.  I will switch torsemide  to 40 mg daily and give her a dose of intravenous furosemide  today. 2.  Acute exacerbation of congestive heart failure: With previous 2D echo evidence of combined systolic and diastolic congestive heart failure and decompensation secondary to diuretic nonadherence.  Difficult situation with diuresis in the setting of advanced chronic kidney disease and continued fluctuation of renal function. 3.  Hypertension: Elevated blood pressures likely secondary to volume status/CHF decompensation.  Improving with diuresis/adjustment of antihypertensive therapy. 4.  Anemia: Without overt blood loss and appears to be secondary to chronic illness including chronic kidney disease.  Hemoglobin improved status post PRBC transfusion, on ESA. 5.  Bronchial asthma: Resume bronchodilator therapy/home inhalers per primary service.  Subjective:   Earlier seen ambulating in hallways with assistance from PT.  Nurse concerned about isolated right arm swelling.  The patient informs me that she usually sleeps resting on her left side (opposite from swollen arm).  Had some minimal pain in the wrist but denies numbness.    Objective:   BP (!) 145/54 (BP Location: Right Arm)   Pulse (!) 59   Temp 97.7 F (36.5 C) (Oral)   Resp 20   Ht 5' 3 (1.6 m)   Wt 60.6 kg   SpO2 99%   BMI 23.67 kg/m   Intake/Output Summary (Last 24 hours) at  12/07/2023 0819 Last data filed at 12/07/2023 0400 Gross per 24 hour  Intake 954 ml  Output 1000 ml  Net -46 ml   Weight change: 0.09 kg  Physical Exam: Gen: She appears comfortable sitting up in bed CVS: Pulse regular rhythm, normal rate, S1 and S2 normal Resp: Poor inspiratory effort with decreased breath sounds over bases.  No distinct rales/wheeze Abd: Soft, flat, nontender, bowel sounds normal Ext: No pretibial edema, trace-1+ edema around ankles/feet.  2-3+ edema right arm below her elbow level with trace edema left arm.  Imaging: No results found.  Labs: BMET Recent Labs  Lab 12/02/23 0230 12/02/23 0752 12/03/23 0320 12/04/23 0238 12/05/23 0224 12/06/23 0242 12/07/23 0222  NA 138  --  137 136 138 138 137  K 4.8  --  5.0 5.1 4.8 4.7 4.6  CL 108  --  107 107 106 104 103  CO2 17*  --  19* 17* 19* 20* 19*  GLUCOSE 117*  --  107* 94 88 101* 99  BUN 75*  --  77* 75* 79* 78* 83*  CREATININE 5.01* 5.25* 5.24* 5.50* 5.74* 5.74* 6.25*  CALCIUM  8.7*  --  8.6* 8.5* 8.4* 8.3* 8.4*  PHOS  --  5.8*  --   --   --   --   --    CBC Recent Labs  Lab 12/04/23 0238 12/05/23 0224 12/05/23 1449 12/06/23 0242  WBC 4.9 4.7 5.4 5.7  HGB 7.8* 7.3* 9.0* 9.8*  HCT 23.8* 22.5* 27.2* 30.2*  MCV 94.1 95.3 94.1 93.2  PLT 222 227 219 232    Medications:  amLODipine   10 mg Oral Daily   budesonide -glycopyrrolate -formoterol   2 puff Inhalation BID   carvedilol   12.5 mg Oral BID   citalopram   10 mg Oral Daily   cyanocobalamin   1,000 mcg Oral Daily   darbepoetin (ARANESP ) injection - DIALYSIS  60 mcg Subcutaneous Q Tue-1800   doxazosin   1 mg Oral QHS   ferrous sulfate   325 mg Oral Q breakfast   hydrALAZINE   100 mg Oral TID   mirtazapine   7.5 mg Oral QHS   montelukast   10 mg Oral Daily   sevelamer  carbonate  800 mg Oral TID WC   sodium bicarbonate   650 mg Oral BID   torsemide   40 mg Oral BID   Gordy Blanch, MD 12/07/2023, 8:19 AM

## 2023-12-07 NOTE — Progress Notes (Signed)
 PROGRESS NOTE  [name]  FMW:[id number] DOB: 1934/04/08 DOA: 12/02/2023 PCP: Charlett Apolinar POUR, MD  Consultants  Brief Narrative: 89 y.o. female with a PMH significant for CHF, CKD 5, anxiety, GERD, hypertension, dyslipidemia presented to the ED with worsening dyspnea and edema for 2 to 3 days. -Reports compliance with diuretics - In the ER hypertensive, creatinine 5.01, BUN 75, bicarb 17, hemoglobin 7.4, BNP 95, chest x-ray with cardiomegaly and interstitial edema, small effusions - Admitted, started on diuretics, nephrology following, seen by palliative care as well - Improving on diuretics, conservative management only   Assessment & Plan: Acute on chronic combined systolic and diastolic CHF CKD stage V: - Significantly improved. -Nephrology managing diuretics, IV Lasix  has been changed to p.o. torsemide  40 mg p.o. twice daily -Not a candidate for dialysis. -GDMT limited by CKD 5 -Creatinine in the 5-6 range -Palliative consulted, waiting to get family on board but daughter leaning towards home with hospice/DNR   Hypertensive urgency: -Resolved -continue Coreg , hydralazine , amlodipine , clonidine    Elevated troponin:  -Likely from demand ischemia -Likely in the setting of fluid overload.  Patient denies any chest pain.  Continue to monitor on telemetry   Anemia of CKD: - Baseline hemoglobin between 8-10.   - Back to her baseline hemoglobin.  Will monitor.   Moderate persistent asthma: - Continue home inhalers.    Depression/anxiety/insomnia:  -Continue Celexa  and Ativan  as needed   Mild dementia, cognitive deficits -Noted    DVT prophylaxis:  Place TED hose Start: 12/03/23 0456 Place and maintain sequential compression device Start: 12/03/23 0455 SCDs Start: 12/02/23 0752  Code Status:   Code Status: Full Code Level of care: Telemetry Medical Status is: Inpatient   Consults called: Nephrology, palliative care  Subjective: Patient sitting up eating  breakfast on my examination this morning.  She reports feeling much better than when she came into the hospital and had no complaints this morning.  Objective: Vitals:   12/06/23 2320 12/07/23 0636 12/07/23 0844 12/07/23 1144  BP: (!) 136/45 (!) 145/54 (!) 179/59 (!) 134/56  Pulse: 60 (!) 59 (!) 56 (!) 51  Resp: 20 20 18 17   Temp: 97.8 F (36.6 C) 97.7 F (36.5 C) (!) 96.5 F (35.8 C)   TempSrc: Oral Oral Oral   SpO2: 98% 99% 98% 99%  Weight:  60.6 kg    Height:  5' 3 (1.6 m)      Intake/Output Summary (Last 24 hours) at 12/07/2023 1528 Last data filed at 12/07/2023 0400 Gross per 24 hour  Intake 477 ml  Output 450 ml  Net 27 ml   Filed Weights   12/05/23 0031 12/06/23 0511 12/07/23 0636  Weight: 60.9 kg 60.5 kg 60.6 kg   Body mass index is 23.67 kg/m.  Gen: 89 y.o. female in no apparent distress.  Nontoxic Pulm: Non-labored breathing.  Clear to auscultation bilaterally.  CV: Regular rate and rhythm. GI: Abdomen soft, non-tender, non-distended Ext: Warm, no deformities, trace bilateral pedal edema Skin: No rashes, lesions  Neuro: Alert and oriented. No focal neurological deficits. Psych: Calm  Judgement and insight appear normal. Mood & affect appropriate.     I have personally reviewed the following labs and images: CBC: Recent Labs  Lab 12/03/23 0320 12/04/23 0238 12/05/23 0224 12/05/23 1449 12/06/23 0242  WBC 4.0 4.9 4.7 5.4 5.7  HGB 6.8* 7.8* 7.3* 9.0* 9.8*  HCT 21.3* 23.8* 22.5* 27.2* 30.2*  MCV 95.1 94.1 95.3 94.1 93.2  PLT 220 222 227 219 232  BMP &GFR Recent Labs  Lab 12/02/23 0752 12/03/23 0320 12/04/23 0238 12/05/23 0224 12/06/23 0242 12/07/23 0222  NA  --  137 136 138 138 137  K  --  5.0 5.1 4.8 4.7 4.6  CL  --  107 107 106 104 103  CO2  --  19* 17* 19* 20* 19*  GLUCOSE  --  107* 94 88 101* 99  BUN  --  77* 75* 79* 78* 83*  CREATININE 5.25* 5.24* 5.50* 5.74* 5.74* 6.25*  CALCIUM   --  8.6* 8.5* 8.4* 8.3* 8.4*  MG 2.1 2.0  --   --    --   --   PHOS 5.8*  --   --   --   --   --    Estimated Creatinine Clearance: 5.2 mL/min (A) (by C-G formula based on SCr of 6.25 mg/dL (H)). Liver & Pancreas: No results for input(s): AST, ALT, ALKPHOS, BILITOT, PROT, ALBUMIN  in the last 168 hours. No results for input(s): LIPASE, AMYLASE in the last 168 hours. No results for input(s): AMMONIA in the last 168 hours. Diabetic: No results for input(s): HGBA1C in the last 72 hours. No results for input(s): GLUCAP in the last 168 hours. Cardiac Enzymes: No results for input(s): CKTOTAL, CKMB, CKMBINDEX, TROPONINI in the last 168 hours. Recent Labs    09/22/23 0036  PROBNP >35,000.0*   Coagulation Profile: No results for input(s): INR, PROTIME in the last 168 hours. Thyroid  Function Tests: No results for input(s): TSH, T4TOTAL, FREET4, T3FREE, THYROIDAB in the last 72 hours. Lipid Profile: No results for input(s): CHOL, HDL, LDLCALC, TRIG, CHOLHDL, LDLDIRECT in the last 72 hours. Anemia Panel: No results for input(s): VITAMINB12, FOLATE, FERRITIN, TIBC, IRON, RETICCTPCT in the last 72 hours. Urine analysis:    Component Value Date/Time   COLORURINE YELLOW 10/14/2023 1720   APPEARANCEUR CLEAR 10/14/2023 1720   LABSPEC 1.013 10/14/2023 1720   PHURINE 6.0 10/14/2023 1720   GLUCOSEU 50 (A) 10/14/2023 1720   GLUCOSEU NEGATIVE 01/16/2023 1238   HGBUR NEGATIVE 10/14/2023 1720   BILIRUBINUR NEGATIVE 10/14/2023 1720   KETONESUR NEGATIVE 10/14/2023 1720   PROTEINUR >=300 (A) 10/14/2023 1720   UROBILINOGEN 0.2 01/16/2023 1238   NITRITE NEGATIVE 10/14/2023 1720   LEUKOCYTESUR NEGATIVE 10/14/2023 1720   Sepsis Labs: Invalid input(s): PROCALCITONIN, LACTICIDVEN  Microbiology: No results found for this or any previous visit (from the past 240 hours).  Radiology Studies: No results found.  Scheduled Meds:  amLODipine   10 mg Oral Daily    budesonide -glycopyrrolate -formoterol   2 puff Inhalation BID   carvedilol   12.5 mg Oral BID   citalopram   10 mg Oral Daily   cyanocobalamin   1,000 mcg Oral Daily   darbepoetin (ARANESP ) injection - DIALYSIS  60 mcg Subcutaneous Q Tue-1800   doxazosin   1 mg Oral QHS   ferrous sulfate   325 mg Oral Q breakfast   hydrALAZINE   100 mg Oral TID   mirtazapine   7.5 mg Oral QHS   montelukast   10 mg Oral Daily   sevelamer  carbonate  800 mg Oral TID WC   sodium bicarbonate   650 mg Oral BID   [START ON 12/08/2023] torsemide   40 mg Oral Daily   Continuous Infusions:   LOS: 5 days   35 minutes with more than 50% spent in reviewing records, counseling patient/family and coordinating care.  Reyes VEAR Gaw, MD [facility] [email or web address] 12/07/2023, 3:28 PM

## 2023-12-07 NOTE — Progress Notes (Signed)
 Palliative:  HPI: HPI: 89 y.o. female  with past medical history of CKD stage V, hypertension, asthma, anemia of chronic disease, chronic combined systolic and diastolic CHF, anxiety, GERD, hypertension, hyperlipidemia  admitted on 12/02/2023 with dyspnea and bilateral edema in lower extremities for 2 to 3 days. In the ED, patient was found to have mild pulmonary edema on chest x-ray, BNP of 905, troponin of 136.  She was admitted for management of fluid overload secondary to acute on chronic CHF and underlying CKD 5. Patient has had 4 hospitalizations in the past 6 months as well as 1 ED visit.  PMT has been consulted to assist with goals of care conversation.   I met today with [name]. She is awake and sitting up in recliner. No complaints. She asks for a cup of coffee which I provided. She is pleasantly confused. Denies any discomfort or questions. Gives me permission to speak with [name]).   I called and discussed with [name]. We reviewed her grandmother's status and trending kidney function. I expressed concern for difficult balance for diuresis and renal function even in the hospital with daily labs and close follow up. I expressed that I do not believe that this balance can be maintained long outside the hospital. We discussed the importance of anticipating symptoms and how to care for [name] when we are unable to maintain. I reviewed with her my recommendation to consider hospice support at home. [name] understands the recommendation and agrees. There are plans for family meeting tomorrow and [facility] plans to see if they will be able to meet today in preparation. She will discuss with the the recommendations for DNR status and hospice support and why these recommendations are being made. We discussed the suffering and limited benefits of resuscitation [name] has full understanding). [name] will keep us  updated and we will speak again tomorrow. I instructed [name] to call  unit RN if family decides to put in place DNR so that provider can be notified to put in place. [name] understands the importance of having this in place just in case - even though no high suspicion of resuscitation needs the chance is never 0%.   All questions/concerns addressed. Emotional support provided. Updated Dr. Elpidio.   Exam: Alert, poor recall and memory. No distress. Good spirits. Breathing regular, unlabored at rest. Poor intake. Abd soft. BLE edema improved.    Plan: - Family having meeting to make decisions regarding wishes for code status and desire for hospice support at home tonight or tomorrow.    45 min  Angela Kitty, NP Palliative Medicine Team Pager [phone] (Please see [email or web address] for schedule) Team Phone [phone]

## 2023-12-07 NOTE — Plan of Care (Signed)
  Problem: Clinical Measurements: Goal: Will remain free from infection Outcome: Progressing Goal: Respiratory complications will improve Outcome: Progressing   Problem: Nutrition: Goal: Adequate nutrition will be maintained Outcome: Progressing   

## 2023-12-08 DIAGNOSIS — I120 Hypertensive chronic kidney disease with stage 5 chronic kidney disease or end stage renal disease: Secondary | ICD-10-CM

## 2023-12-08 LAB — BASIC METABOLIC PANEL WITH GFR
Anion gap: 11 (ref 5–15)
BUN: 80 mg/dL — ABNORMAL HIGH (ref 8–23)
CO2: 20 mmol/L — ABNORMAL LOW (ref 22–32)
Calcium: 8.4 mg/dL — ABNORMAL LOW (ref 8.9–10.3)
Chloride: 104 mmol/L (ref 98–111)
Creatinine, Ser: 5.8 mg/dL — ABNORMAL HIGH (ref 0.44–1.00)
GFR, Estimated: 7 mL/min — ABNORMAL LOW (ref >60)
Glucose, Bld: 106 mg/dL — ABNORMAL HIGH (ref 70–99)
Potassium: 4.4 mmol/L (ref 3.5–5.1)
Sodium: 135 mmol/L (ref 135–145)

## 2023-12-08 MED ORDER — FUROSEMIDE 10 MG/ML IJ SOLN
80.0000 mg | Freq: Once | INTRAMUSCULAR | Status: AC
  Administered 2023-12-08: 80 mg via INTRAVENOUS
  Filled 2023-12-08: qty 8

## 2023-12-08 NOTE — Progress Notes (Signed)
 Patient ID: [name], female   DOB: 09/13/1933, 89 y.o.   MRN: [id number] [facility] Progress Note   Assessment/ Plan:   1.  Acute kidney injury on chronic kidney disease stage V: Appears cardiorenal in mechanism with volume overload/CHF decompensation secondary to diuretic nonadherence.  Yesterday given intravenous diuretic bolus and along with her oral diuretic with concerns of increased right arm swelling.  Resume oral diuretics today and continue to monitor urine output/renal function.  Renal function slightly improved overnight.  At this point, favor discharge home with hospice care.   2.  Acute exacerbation of congestive heart failure: With previous 2D echo evidence of combined systolic and diastolic congestive heart failure and decompensation secondary to diuretic nonadherence.  Difficult situation with diuresis in the setting of advanced chronic kidney disease and continued fluctuation of renal function. 3.  Hypertension: Elevated blood pressures likely secondary to volume status/CHF decompensation.  Improving with diuresis/adjustment of antihypertensive therapy. 4.  Anemia: Without overt blood loss and appears to be secondary to chronic illness including chronic kidney disease.  Hemoglobin improved status post PRBC transfusion, on ESA. 5.  Bronchial asthma: Resume bronchodilator therapy/home inhalers per primary service.  Subjective:   Reports to be feeling a little better this morning with some decrease in right hand swelling.  Appreciate input from palliative care service and their efforts communicating with family.   Objective:   BP (!) 161/55 (BP Location: Left Arm)   Pulse 65   Temp 97.9 F (36.6 C) (Oral)   Resp 18   Ht 5' 3 (1.6 m)   Wt 60.5 kg   SpO2 96%   BMI 23.63 kg/m   Intake/Output Summary (Last 24 hours) at 12/08/2023 0813 Last data filed at 12/08/2023 0701 Gross per 24 hour  Intake --  Output 1000 ml  Net -1000 ml   Weight change: -0.1  kg  Physical Exam: Gen: She appears comfortable sitting up in bed CVS: Pulse regular rhythm, normal rate, S1 and S2 normal Resp: Poor inspiratory effort with decreased breath sounds over bases.  No distinct rales/wheeze Abd: Soft, flat, nontender, bowel sounds normal Ext: No lower extremity edema with trace-1+ right arm edema below elbow level  Imaging: No results found.  Labs: BMET Recent Labs  Lab 12/02/23 0230 12/02/23 9247 12/03/23 0320 12/04/23 0238 12/05/23 0224 12/06/23 0242 12/07/23 0222 12/08/23 0344  NA 138  --  137 136 138 138 137 135  K 4.8  --  5.0 5.1 4.8 4.7 4.6 4.4  CL 108  --  107 107 106 104 103 104  CO2 17*  --  19* 17* 19* 20* 19* 20*  GLUCOSE 117*  --  107* 94 88 101* 99 106*  BUN 75*  --  77* 75* 79* 78* 83* 80*  CREATININE 5.01* 5.25* 5.24* 5.50* 5.74* 5.74* 6.25* 5.80*  CALCIUM  8.7*  --  8.6* 8.5* 8.4* 8.3* 8.4* 8.4*  PHOS  --  5.8*  --   --   --   --   --   --    CBC Recent Labs  Lab 12/04/23 0238 12/05/23 0224 12/05/23 1449 12/06/23 0242  WBC 4.9 4.7 5.4 5.7  HGB 7.8* 7.3* 9.0* 9.8*  HCT 23.8* 22.5* 27.2* 30.2*  MCV 94.1 95.3 94.1 93.2  PLT 222 227 219 232    Medications:     amLODipine   10 mg Oral Daily   budesonide -glycopyrrolate -formoterol   2 puff Inhalation BID   carvedilol   12.5 mg Oral BID  citalopram   10 mg Oral Daily   cyanocobalamin   1,000 mcg Oral Daily   darbepoetin (ARANESP ) injection - DIALYSIS  60 mcg Subcutaneous Q Tue-1800   doxazosin   1 mg Oral QHS   ferrous sulfate   325 mg Oral Q breakfast   hydrALAZINE   100 mg Oral TID   mirtazapine   7.5 mg Oral QHS   montelukast   10 mg Oral Daily   sevelamer  carbonate  800 mg Oral TID WC   sodium bicarbonate   650 mg Oral BID   torsemide   40 mg Oral Daily   Gordy Blanch, MD 12/08/2023, 8:13 AM

## 2023-12-08 NOTE — Progress Notes (Signed)
 PROGRESS NOTE  [name]  FMW:[id number] DOB: 08-Apr-1934 DOA: 12/02/2023 PCP: Charlett Apolinar POUR, MD  Consultants  Brief Narrative: 89 y.o. female with a PMH significant for CHF, CKD 5, anxiety, GERD, hypertension, dyslipidemia presented to the ED with worsening dyspnea and edema for 2 to 3 days prior to admission. - In the ER hypertensive, creatinine 5.01, BUN 75, bicarb 17, hemoglobin 7.4, BNP 95, chest x-ray with cardiomegaly and interstitial edema, small effusions - Admitted with initial diagnosis of acute on chronic combined systolic/diastolic CHF. Started on diuretics, nephrology also following secondary to CKD stage V.  Palliative consulted while in house and working with family as they now favored discharge home with hospice care but this is still being worked out. - Improving on diuretics, conservative management only   Assessment & Plan: Acute on chronic combined systolic and diastolic CHF CKD stage V: - Significantly improved.  She did have some issues with right hand swelling yesterday and she did receive a dose of IV diuresis.  She is now back on her oral diuretics per nephrology. -Not a candidate for dialysis. -GDMT limited by CKD 5 -Creatinine in the 5-6 range -Palliative consulted, waiting to get family on board but daughter leaning towards home with hospice/DNR.  Family meeting this afternoon pending.   Hypertensive urgency: -Resolved, noted on admission. -continue Coreg , hydralazine , amlodipine , clonidine    Elevated troponin:  -Likely from demand ischemia -Likely in the setting of fluid overload.  Patient denies any chest pain.  Continue to monitor on telemetry -Downtrending after admission.   Anemia of CKD: - Baseline hemoglobin between 8-10.   - Back to her baseline hemoglobin.  Will monitor.   Moderate persistent asthma: - Continue home inhalers.    Depression/anxiety/insomnia:  -Continue Celexa  and Ativan  as needed   Mild dementia, cognitive  deficits -Noted    DVT prophylaxis:  Place TED hose Start: 12/03/23 0456 Place and maintain sequential compression device Start: 12/03/23 0455 SCDs Start: 12/02/23 0752  Code Status:   Code Status: Full Code Level of care: Telemetry Medical Status is: Inpatient Dispo: Family meeting today.  Favor home with home hospice, await family input.  Consults called: Nephrology, palliative care  Subjective: Patient sitting up eating breakfast on my examination this morning.  She has no complaints.  She reports that she continues to feel well/better than when she came into the hospital.    Objective: Vitals:   12/08/23 0723 12/08/23 0935 12/08/23 1229 12/08/23 1300  BP:  (!) 166/53 120/60 (!) 136/54  Pulse:  61  (!) 54  Resp:  18 16 18   Temp:  98 F (36.7 C) (!) 96.5 F (35.8 C) (!) 97 F (36.1 C)  TempSrc:  Oral Oral Oral  SpO2: 96% 100% 100% 100%  Weight:      Height:        Intake/Output Summary (Last 24 hours) at 12/08/2023 1516 Last data filed at 12/08/2023 1300 Gross per 24 hour  Intake --  Output 1400 ml  Net -1400 ml   Filed Weights   12/06/23 0511 12/07/23 0636 12/08/23 0656  Weight: 60.5 kg 60.6 kg 60.5 kg   Body mass index is 23.63 kg/m.  Gen: 89 y.o. female in no apparent distress.  Nontoxic Pulm: Non-labored breathing.  Clear to auscultation bilaterally.  CV: Regular rate and rhythm. GI: Abdomen soft, non-tender, non-distended Ext: Warm, no deformities, trace bilateral pedal edema, with +1 right hand edema noted Skin: No rashes, lesions  Neuro: Alert and oriented. No focal neurological  deficits. Psych: Calm  Judgement and insight appear normal. Mood & affect appropriate.     I have personally reviewed the following labs and images: CBC: Recent Labs  Lab 12/03/23 0320 12/04/23 0238 12/05/23 0224 12/05/23 1449 12/06/23 0242  WBC 4.0 4.9 4.7 5.4 5.7  HGB 6.8* 7.8* 7.3* 9.0* 9.8*  HCT 21.3* 23.8* 22.5* 27.2* 30.2*  MCV 95.1 94.1 95.3 94.1 93.2  PLT  220 222 227 219 232   BMP &GFR Recent Labs  Lab 12/02/23 0752 12/03/23 0320 12/04/23 0238 12/05/23 0224 12/06/23 0242 12/07/23 0222 12/08/23 0344  NA  --  137 136 138 138 137 135  K  --  5.0 5.1 4.8 4.7 4.6 4.4  CL  --  107 107 106 104 103 104  CO2  --  19* 17* 19* 20* 19* 20*  GLUCOSE  --  107* 94 88 101* 99 106*  BUN  --  77* 75* 79* 78* 83* 80*  CREATININE 5.25* 5.24* 5.50* 5.74* 5.74* 6.25* 5.80*  CALCIUM   --  8.6* 8.5* 8.4* 8.3* 8.4* 8.4*  MG 2.1 2.0  --   --   --   --   --   PHOS 5.8*  --   --   --   --   --   --    Estimated Creatinine Clearance: 5.7 mL/min (A) (by C-G formula based on SCr of 5.8 mg/dL (H)). Liver & Pancreas: No results for input(s): AST, ALT, ALKPHOS, BILITOT, PROT, ALBUMIN  in the last 168 hours. No results for input(s): LIPASE, AMYLASE in the last 168 hours. No results for input(s): AMMONIA in the last 168 hours. Diabetic: No results for input(s): HGBA1C in the last 72 hours. No results for input(s): GLUCAP in the last 168 hours. Cardiac Enzymes: No results for input(s): CKTOTAL, CKMB, CKMBINDEX, TROPONINI in the last 168 hours. Recent Labs    09/22/23 0036  PROBNP >35,000.0*   Coagulation Profile: No results for input(s): INR, PROTIME in the last 168 hours. Thyroid  Function Tests: No results for input(s): TSH, T4TOTAL, FREET4, T3FREE, THYROIDAB in the last 72 hours. Lipid Profile: No results for input(s): CHOL, HDL, LDLCALC, TRIG, CHOLHDL, LDLDIRECT in the last 72 hours. Anemia Panel: No results for input(s): VITAMINB12, FOLATE, FERRITIN, TIBC, IRON, RETICCTPCT in the last 72 hours. Urine analysis:    Component Value Date/Time   COLORURINE YELLOW 10/14/2023 1720   APPEARANCEUR CLEAR 10/14/2023 1720   LABSPEC 1.013 10/14/2023 1720   PHURINE 6.0 10/14/2023 1720   GLUCOSEU 50 (A) 10/14/2023 1720   GLUCOSEU NEGATIVE 01/16/2023 1238   HGBUR NEGATIVE 10/14/2023 1720    BILIRUBINUR NEGATIVE 10/14/2023 1720   KETONESUR NEGATIVE 10/14/2023 1720   PROTEINUR >=300 (A) 10/14/2023 1720   UROBILINOGEN 0.2 01/16/2023 1238   NITRITE NEGATIVE 10/14/2023 1720   LEUKOCYTESUR NEGATIVE 10/14/2023 1720   Sepsis Labs: Invalid input(s): PROCALCITONIN, LACTICIDVEN  Microbiology: No results found for this or any previous visit (from the past 240 hours).  Radiology Studies: No results found.  Scheduled Meds:  amLODipine   10 mg Oral Daily   budesonide -glycopyrrolate -formoterol   2 puff Inhalation BID   carvedilol   12.5 mg Oral BID   citalopram   10 mg Oral Daily   cyanocobalamin   1,000 mcg Oral Daily   darbepoetin (ARANESP ) injection - DIALYSIS  60 mcg Subcutaneous Q Tue-1800   doxazosin   1 mg Oral QHS   ferrous sulfate   325 mg Oral Q breakfast   hydrALAZINE   100 mg Oral TID   mirtazapine   7.5 mg  Oral QHS   montelukast   10 mg Oral Daily   sevelamer  carbonate  800 mg Oral TID WC   sodium bicarbonate   650 mg Oral BID   torsemide   40 mg Oral Daily   Continuous Infusions:   LOS: 6 days   35 minutes with more than 50% spent in reviewing records, counseling patient/family and coordinating care.  Reyes VEAR Gaw, MD [facility] [email or web address] 12/08/2023, 3:16 PM

## 2023-12-08 NOTE — Progress Notes (Signed)
 Mobility Specialist Progress Note:    12/08/23 0957  Mobility  Activity Ambulated with assistance in hallway;Transferred from bed to chair  Level of Assistance Standby assist, set-up cues, supervision of patient - no hands on  Assistive Device Four wheel walker  Distance Ambulated (ft) 300 ft  Activity Response Tolerated well (1 seated rest break)  Mobility Referral Yes  Mobility visit 1 Mobility  Mobility Specialist Start Time (ACUTE ONLY) 0957  Mobility Specialist Stop Time (ACUTE ONLY) 1025  Mobility Specialist Time Calculation (min) (ACUTE ONLY) 28 min   Pt agreeable to session and ambulating instead of just transferring. Pt utilized rollator and did very well w/ it. One seated break towards end of the hallway before ambulating back. No c/o any symptoms other than feeling tired. Left pt in recliner w/ all needs met. RN notified.   Venetia Keel Mobility Specialist Please Neurosurgeon or Rehab Office at [phone]

## 2023-12-08 NOTE — Progress Notes (Signed)
 Daily Progress Note   Patient Name: [name]       Date: 12/08/2023 DOB: 1933-05-31  Age: 89 y.o. MRN#: [id number] Attending Physician: Elpidio Reyes DEL, MD Primary Care Physician: Charlett Apolinar POUR, MD Admit Date: 12/02/2023  Reason for Consultation/Follow-up: Establishing goals of care  Subjective: Medical records reviewed including progress notes, labs and imaging.  I called patient's granddaughter Harland to follow-up on family discussions and decisions regarding CODE STATUS and hospice at home.  She tells me that the current plan is for them to get together at 1 PM this afternoon to have this conversation.  I shared that I would reach back out afterwards and make sure we are all on the same page.  She is appreciative. Patient assessed at the bedside.  She is sitting in bedside chair, pleasantly confused.  Wishes to go to the commode.  RN arrived to assist.  No family was present during my visit.  I called granddaughter again to follow-up, however my call went directly to voicemail and I was unable to reach.  Left voicemail with PMT contact information.  Questions and concerns addressed. PMT will continue to support holistically.   Length of Stay: 6   Physical Exam Vitals and nursing note reviewed.  Constitutional:      General: She is not in acute distress. HENT:     Head: Normocephalic and atraumatic.  Pulmonary:     Effort: Pulmonary effort is normal.  Neurological:     Mental Status: She is alert.  Psychiatric:        Mood and Affect: Mood normal.        Behavior: Behavior normal.             Vital Signs: BP (!) 161/55 (BP Location: Left Arm)   Pulse 65   Temp 97.9 F (36.6 C) (Oral)   Resp 18   Ht 5' 3 (1.6 m)   Wt 60.5 kg   SpO2 96%   BMI 23.63 kg/m  SpO2: SpO2: 96 % O2 Device: O2 Device: Room Air O2  Flow Rate:    Palliative Care Assessment & Plan   Patient Profile: 89 y.o. female  with past medical history of CKD stage V, hypertension, asthma, anemia of chronic disease, chronic combined systolic and diastolic CHF, anxiety, GERD, hypertension, hyperlipidemia  admitted on 12/02/2023 with dyspnea and bilateral edema in lower extremities for 2 to 3 days.    In the ED, patient was found to have mild pulmonary edema on chest x-ray, BNP of 905, troponin of 136.  She was admitted for management of fluid overload secondary to  acute on chronic CHF and underlying CKD 5.   Patient has had 4 hospitalizations in the past 6 months as well as 1 ED visit.  PMT has been consulted to assist with goals of care conversation.  Assessment: Goals of care conversation Acute on chronic CHF CKD stage V Cognitive impairment  Recommendations/Plan: Continue to await family decisions on CODE STATUS and possible hospice at home; granddaughter reported this morning that they planned to have their meeting at 1 PM today Ongoing goals of care discussions PMT will continue to follow and support    Prognosis: Poor long-term prognosis given acute on chronic illness and several recurrent hospitalizations; hospice appropriate if aligned with goals of care  Discharge Planning: To Be Determined  Care plan was discussed with patient, patient's granddaughter, TOC, MD   Total time: I spent 25 minutes in the care of the patient today in the above activities and documenting the encounter.          Demonte Dobratz P Tinamarie Przybylski, PA-C  Palliative Medicine Team Team phone # [phone]  Thank you for allowing the Palliative Medicine Team to assist in the care of this patient. Please utilize secure chat with additional questions, if there is no response within 30 minutes please call the above phone number.  Palliative Medicine Team providers are available by phone from 7am to 7pm daily and can be reached through the team cell  phone.  Should this patient require assistance outside of these hours, please call the patient's attending physician.

## 2023-12-09 DIAGNOSIS — Z66 Do not resuscitate: Secondary | ICD-10-CM

## 2023-12-09 LAB — BASIC METABOLIC PANEL WITH GFR
Anion gap: 12 (ref 5–15)
BUN: 80 mg/dL — ABNORMAL HIGH (ref 8–23)
CO2: 22 mmol/L (ref 22–32)
Calcium: 8.3 mg/dL — ABNORMAL LOW (ref 8.9–10.3)
Chloride: 104 mmol/L (ref 98–111)
Creatinine, Ser: 5.82 mg/dL — ABNORMAL HIGH (ref 0.44–1.00)
GFR, Estimated: 7 mL/min — ABNORMAL LOW (ref >60)
Glucose, Bld: 97 mg/dL (ref 70–99)
Potassium: 4.4 mmol/L (ref 3.5–5.1)
Sodium: 138 mmol/L (ref 135–145)

## 2023-12-09 NOTE — Progress Notes (Signed)
 Mobility Specialist Progress Note:    12/09/23 1215  Mobility  Activity Ambulated independently in room;Dangled on edge of bed  Level of Assistance Other (Comment) (HHA)  Assistive Device None  Distance Ambulated (ft) 25 ft  Activity Response Tolerated well  Mobility Referral Yes  Mobility visit 1 Mobility  Mobility Specialist Start Time (ACUTE ONLY) 1215  Mobility Specialist Stop Time (ACUTE ONLY) 1226  Mobility Specialist Time Calculation (min) (ACUTE ONLY) 11 min   Pt feeling more tired today but agreeable to session. Pt willing to ambulate in room and wanted to sit EOB for lunch. HHA moving well in room. C/o some neck pain and fatigue. Left pt on EOB eating lunch w/ all needs met. Notified NT.   Venetia Keel Mobility Specialist Please Contact via SecureChat or Rehab Office at [phone]

## 2023-12-09 NOTE — Progress Notes (Signed)
 Patient ID: [name], female   DOB: [birth year], 89 y.o.   MRN: [id number] [facility] Progress Note   Assessment/ Plan:   1.  Acute kidney injury on chronic kidney disease stage V: She has made the decision to not undertake renal replacement therapy.  Etiology appears cardiorenal in mechanism with volume overload/CHF decompensation secondary to diuretic nonadherence.  Was transition from intravenous to oral diuretics and will continue this with ongoing monitoring of volume status.  Renal function essentially stable/unchanged overnight and may be able to allow discharge to SNF within the next 24-48 hours if remains stable.   2.  Acute exacerbation of congestive heart failure: With previous 2D echo evidence of combined systolic and diastolic congestive heart failure and decompensation secondary to diuretic nonadherence.  Difficult situation with diuresis in the setting of advanced chronic kidney disease and continued fluctuation of renal function. 3.  Hypertension: Elevated blood pressures likely secondary to volume status/CHF decompensation.  Improving with diuresis/adjustment of antihypertensive therapy. 4.  Anemia: Without overt blood loss and appears to be secondary to chronic illness including chronic kidney disease.  Hemoglobin improved status post PRBC transfusion, on ESA. 5.  Bronchial asthma: Resume bronchodilator therapy/home inhalers per primary service.  Subjective:   Denies any acute events overnight and specifically denies any chest pain or shortness of breath.  Right hand swelling resolved.   Objective:   BP (!) 165/92 (BP Location: Left Arm)   Pulse 66   Temp 98.3 F (36.8 C) (Oral)   Resp 17   Ht 5' 3 (1.6 m)   Wt 60.5 kg   SpO2 100%   BMI 23.63 kg/m   Intake/Output Summary (Last 24 hours) at 12/09/2023 0932 Last data filed at 12/09/2023 0900 Gross per 24 hour  Intake --  Output 1150 ml  Net -1150 ml   Weight change:   Physical Exam: Gen:  Comfortably resting in bed, awake/alert CVS: Pulse regular rhythm, normal rate, S1 and S2 normal Resp: Poor inspiratory effort with decreased breath sounds over bases.  No distinct rales/wheeze Abd: Soft, flat, nontender, bowel sounds normal Ext: No lower extremity edema with resolution of right upper extremity edema.  Imaging: No results found.  Labs: BMET Recent Labs  Lab 12/03/23 0320 12/04/23 0238 12/05/23 0224 12/06/23 0242 12/07/23 0222 12/08/23 0344 12/09/23 0332  NA 137 136 138 138 137 135 138  K 5.0 5.1 4.8 4.7 4.6 4.4 4.4  CL 107 107 106 104 103 104 104  CO2 19* 17* 19* 20* 19* 20* 22  GLUCOSE 107* 94 88 101* 99 106* 97  BUN 77* 75* 79* 78* 83* 80* 80*  CREATININE 5.24* 5.50* 5.74* 5.74* 6.25* 5.80* 5.82*  CALCIUM  8.6* 8.5* 8.4* 8.3* 8.4* 8.4* 8.3*   CBC Recent Labs  Lab 12/04/23 0238 12/05/23 0224 12/05/23 1449 12/06/23 0242  WBC 4.9 4.7 5.4 5.7  HGB 7.8* 7.3* 9.0* 9.8*  HCT 23.8* 22.5* 27.2* 30.2*  MCV 94.1 95.3 94.1 93.2  PLT 222 227 219 232    Medications:     amLODipine   10 mg Oral Daily   budesonide -glycopyrrolate -formoterol   2 puff Inhalation BID   carvedilol   12.5 mg Oral BID   citalopram   10 mg Oral Daily   cyanocobalamin   1,000 mcg Oral Daily   darbepoetin (ARANESP ) injection - DIALYSIS  60 mcg Subcutaneous Q Tue-1800   doxazosin   1 mg Oral QHS   ferrous sulfate   325 mg Oral Q breakfast   hydrALAZINE   100 mg Oral TID  mirtazapine   7.5 mg Oral QHS   montelukast   10 mg Oral Daily   sevelamer  carbonate  800 mg Oral TID WC   sodium bicarbonate   650 mg Oral BID   torsemide   40 mg Oral Daily   Gordy Blanch, MD 12/09/2023, 9:32 AM

## 2023-12-09 NOTE — Progress Notes (Signed)
 PROGRESS NOTE  [name]  FMW:[id number] DOB: 1933/12/11 DOA: 12/02/2023 PCP: Charlett Apolinar POUR, MD  Consultants  Brief Narrative: 89 y.o. female with a PMH significant for CHF, CKD 5, anxiety, GERD, hypertension, dyslipidemia presented to the ED with worsening dyspnea and edema for 2 to 3 days prior to admission. - In the ER hypertensive, creatinine 5.01, BUN 75, bicarb 17, hemoglobin 7.4, BNP 95, chest x-ray with cardiomegaly and interstitial edema, small effusions - Admitted with initial diagnosis of acute on chronic combined systolic/diastolic CHF. Started on diuretics, nephrology also following secondary to CKD stage V.  Palliative consulted while in house and working with family as they now favored discharge home with hospice care but this is still being worked out. - Improving on diuretics, conservative management only   Assessment & Plan: Acute on chronic combined systolic and diastolic CHF CKD stage V: - Significantly improved.  She did have some issues with right hand swelling yesterday and she did receive a dose of IV diuresis.  She is now back on her oral diuretics per nephrology. -Not a candidate for dialysis. -GDMT limited by CKD 5 -Creatinine in the 5-6 range -Palliative consulted, waiting to get family on board but daughter leaning towards home with hospice/DNR.  Family meeting 7/26 went well according to palliative and patient is now DNR/DNI. - Renal function is stable and if continues remain stable on p.o. diuretics plan will be to discharge what sounds to be home tomorrow, need to clarify with family.   Hypertensive urgency: -Resolved, noted on admission. -continue Coreg , hydralazine , amlodipine , clonidine    Elevated troponin:  -Likely from demand ischemia -Likely in the setting of fluid overload.  Patient denies any chest pain.  Continue to monitor on telemetry -Downtrending after admission.   Anemia of CKD: - Baseline hemoglobin between 8-10.   - Back to  her baseline hemoglobin.  Will monitor.   Moderate persistent asthma: - Continue home inhalers.    Depression/anxiety/insomnia:  -Continue Celexa  and Ativan  as needed   Mild dementia, cognitive deficits -Noted, at baseline    DVT prophylaxis:  Place TED hose Start: 12/03/23 0456 Place and maintain sequential compression device Start: 12/03/23 0455 SCDs Start: 12/02/23 0752  Code Status:   Code Status: Limited: Do not attempt resuscitation (DNR) -DNR-LIMITED -Do Not Intubate/DNI  Level of care: Telemetry Medical Status is: Inpatient Dispo: Family meeting today.  Favor home with home hospice, await family input.  Consults called: Nephrology, palliative care  Subjective: Patient sitting up without complaints.  She reports that she continues to feel well/better than when she came into the hospital.    Objective: Vitals:   12/09/23 0008 12/09/23 0321 12/09/23 0804 12/09/23 1127  BP: (!) 133/52 (!) 142/57 (!) 165/92 (!) 133/53  Pulse: 66 65 66 (!) 58  Resp: 17 17  18   Temp: 97.8 F (36.6 C) 98.6 F (37 C) 98.3 F (36.8 C) 97.9 F (36.6 C)  TempSrc: Oral Oral Oral Oral  SpO2: 98% 94% 100% 100%  Weight:      Height:        Intake/Output Summary (Last 24 hours) at 12/09/2023 1520 Last data filed at 12/09/2023 0900 Gross per 24 hour  Intake --  Output 750 ml  Net -750 ml   Filed Weights   12/06/23 0511 12/07/23 0636 12/08/23 0656  Weight: 60.5 kg 60.6 kg 60.5 kg   Body mass index is 23.63 kg/m.  Gen: 89 y.o. female in no apparent distress.  Nontoxic Pulm: Non-labored breathing.  Clear to auscultation bilaterally.  CV: Regular rate and rhythm. GI: Abdomen soft, non-tender, non-distended Ext: Warm, no deformities, trace bilateral pedal edema, with +1 right hand edema noted Skin: No rashes, lesions  Neuro: Alert and oriented. No focal neurological deficits. Psych: Calm  Judgement and insight appear normal. Mood & affect appropriate.     I have personally reviewed  the following labs and images: CBC: Recent Labs  Lab 12/03/23 0320 12/04/23 0238 12/05/23 0224 12/05/23 1449 12/06/23 0242  WBC 4.0 4.9 4.7 5.4 5.7  HGB 6.8* 7.8* 7.3* 9.0* 9.8*  HCT 21.3* 23.8* 22.5* 27.2* 30.2*  MCV 95.1 94.1 95.3 94.1 93.2  PLT 220 222 227 219 232   BMP &GFR Recent Labs  Lab 12/03/23 0320 12/04/23 0238 12/05/23 0224 12/06/23 0242 12/07/23 0222 12/08/23 0344 12/09/23 0332  NA 137   < > 138 138 137 135 138  K 5.0   < > 4.8 4.7 4.6 4.4 4.4  CL 107   < > 106 104 103 104 104  CO2 19*   < > 19* 20* 19* 20* 22  GLUCOSE 107*   < > 88 101* 99 106* 97  BUN 77*   < > 79* 78* 83* 80* 80*  CREATININE 5.24*   < > 5.74* 5.74* 6.25* 5.80* 5.82*  CALCIUM  8.6*   < > 8.4* 8.3* 8.4* 8.4* 8.3*  MG 2.0  --   --   --   --   --   --    < > = values in this interval not displayed.   Estimated Creatinine Clearance: 5.6 mL/min (A) (by C-G formula based on SCr of 5.82 mg/dL (H)). Liver & Pancreas: No results for input(s): AST, ALT, ALKPHOS, BILITOT, PROT, ALBUMIN  in the last 168 hours. No results for input(s): LIPASE, AMYLASE in the last 168 hours. No results for input(s): AMMONIA in the last 168 hours. Diabetic: No results for input(s): HGBA1C in the last 72 hours. No results for input(s): GLUCAP in the last 168 hours. Cardiac Enzymes: No results for input(s): CKTOTAL, CKMB, CKMBINDEX, TROPONINI in the last 168 hours. Recent Labs    09/22/23 0036  PROBNP >35,000.0*   Coagulation Profile: No results for input(s): INR, PROTIME in the last 168 hours. Thyroid  Function Tests: No results for input(s): TSH, T4TOTAL, FREET4, T3FREE, THYROIDAB in the last 72 hours. Lipid Profile: No results for input(s): CHOL, HDL, LDLCALC, TRIG, CHOLHDL, LDLDIRECT in the last 72 hours. Anemia Panel: No results for input(s): VITAMINB12, FOLATE, FERRITIN, TIBC, IRON, RETICCTPCT in the last 72 hours. Urine analysis:     Component Value Date/Time   COLORURINE YELLOW 10/14/2023 1720   APPEARANCEUR CLEAR 10/14/2023 1720   LABSPEC 1.013 10/14/2023 1720   PHURINE 6.0 10/14/2023 1720   GLUCOSEU 50 (A) 10/14/2023 1720   GLUCOSEU NEGATIVE 01/16/2023 1238   HGBUR NEGATIVE 10/14/2023 1720   BILIRUBINUR NEGATIVE 10/14/2023 1720   KETONESUR NEGATIVE 10/14/2023 1720   PROTEINUR >=300 (A) 10/14/2023 1720   UROBILINOGEN 0.2 01/16/2023 1238   NITRITE NEGATIVE 10/14/2023 1720   LEUKOCYTESUR NEGATIVE 10/14/2023 1720   Sepsis Labs: Invalid input(s): PROCALCITONIN, LACTICIDVEN  Microbiology: No results found for this or any previous visit (from the past 240 hours).  Radiology Studies: No results found.  Scheduled Meds:  amLODipine   10 mg Oral Daily   budesonide -glycopyrrolate -formoterol   2 puff Inhalation BID   carvedilol   12.5 mg Oral BID   citalopram   10 mg Oral Daily   cyanocobalamin   1,000 mcg Oral Daily  darbepoetin (ARANESP ) injection - DIALYSIS  60 mcg Subcutaneous Q Tue-1800   doxazosin   1 mg Oral QHS   ferrous sulfate   325 mg Oral Q breakfast   hydrALAZINE   100 mg Oral TID   mirtazapine   7.5 mg Oral QHS   montelukast   10 mg Oral Daily   sevelamer  carbonate  800 mg Oral TID WC   sodium bicarbonate   650 mg Oral BID   torsemide   40 mg Oral Daily   Continuous Infusions:   LOS: 7 days   35 minutes with more than 50% spent in reviewing records, counseling patient/family and coordinating care.  Reyes VEAR Gaw, MD [facility] [email or web address] 12/09/2023, 3:20 PM

## 2023-12-09 NOTE — Progress Notes (Signed)
 Palliative:  HPI: 89 y.o. female with past medical history of CKD stage V, hypertension, asthma, anemia of chronic disease, chronic combined systolic and diastolic CHF, anxiety, GERD, hypertension, hyperlipidemia admitted on 12/02/2023 with dyspnea and bilateral edema in lower extremities for 2 to 3 days. In the ED, patient was found to have mild pulmonary edema on chest x-ray, BNP of 905, troponin of 136. She was admitted for management of fluid overload secondary to acute on chronic CHF and underlying CKD 5. Patient has had 4 hospitalizations in the past 6 months as well as 1 ED visit. PMT has been consulted to assist with goals of care conversation.   I met today with Ms. [name]. She is sitting on side of bed and feeding herself breakfast. She has no complaints. She noted an episode of shortness of breath during the night and we discussed avoiding lying flat and this may help. She reports she is ready to go home and hopeful she can continue to feel better at home. She gives me permission to call and speak with [facility].   I called and discussed with [name]. Harland shares that the family meeting went well yesterday and everyone now has good understanding of what to expect and are on the same page. She reports that all agree for DNR at this time and I will put this in place. Educated on [place] DNR form and to place this on fridge to use in the event of EMS called to the home to prevent resuscitation intervention. We reviewed hospice and it seems they are open to hospice but wish to hold on initiating hospice at this time. I educated about outpatient palliative care and how to access hospice support from home. [name] expresses understanding. I explained that I anticipate that her grandmother should be ready to return home likely today or tomorrow.   All questions/concerns addressed. Emotional support provided. Updated Dr. Elpidio.   Exam: Alert, poor recall and memory. No distress. Good spirits.  Breathing regular, unlabored at rest. Poor intake at times but overall with some improvement. Abd soft. BLE edema improved.    Plan: - DNR/DNI - Home with home health and outpatient palliative - Open to transition to home hospice services in the future  35 min  Bernarda Kitty, NP Palliative Medicine Team Pager [phone] (Please see [email or web address] for schedule) Team Phone [phone]

## 2023-12-09 NOTE — Plan of Care (Signed)
  Problem: Health Behavior/Discharge Planning: Goal: Ability to manage health-related needs will improve Outcome: Progressing   Problem: Clinical Measurements: Goal: Ability to maintain clinical measurements within normal limits will improve Outcome: Progressing Goal: Will remain free from infection Outcome: Progressing Goal: Diagnostic test results will improve Outcome: Progressing Goal: Respiratory complications will improve Outcome: Progressing Goal: Cardiovascular complication will be avoided Outcome: Progressing   Problem: Activity: Goal: Risk for activity intolerance will decrease Outcome: Progressing   Problem: Nutrition: Goal: Adequate nutrition will be maintained Outcome: Progressing   Problem: Coping: Goal: Level of anxiety will decrease Outcome: Progressing   Problem: Elimination: Goal: Will not experience complications related to bowel motility Outcome: Progressing Goal: Will not experience complications related to urinary retention Outcome: Progressing   Problem: Pain Managment: Goal: General experience of comfort will improve and/or be controlled Outcome: Progressing   Problem: Safety: Goal: Ability to remain free from injury will improve Outcome: Progressing   Problem: Skin Integrity: Goal: Risk for impaired skin integrity will decrease Outcome: Progressing   Problem: Education: Goal: Ability to demonstrate management of disease process will improve Outcome: Progressing Goal: Ability to verbalize understanding of medication therapies will improve Outcome: Progressing Goal: Individualized Educational Video(s) Outcome: Progressing   Problem: Activity: Goal: Capacity to carry out activities will improve Outcome: Progressing   Problem: Cardiac: Goal: Ability to achieve and maintain adequate cardiopulmonary perfusion will improve Outcome: Progressing

## 2023-12-10 ENCOUNTER — Other Ambulatory Visit (HOSPITAL_COMMUNITY): Payer: Self-pay

## 2023-12-10 LAB — BASIC METABOLIC PANEL WITH GFR
Anion gap: 11 (ref 5–15)
BUN: 85 mg/dL — ABNORMAL HIGH (ref 8–23)
CO2: 23 mmol/L (ref 22–32)
Calcium: 8.1 mg/dL — ABNORMAL LOW (ref 8.9–10.3)
Chloride: 101 mmol/L (ref 98–111)
Creatinine, Ser: 6.17 mg/dL — ABNORMAL HIGH (ref 0.44–1.00)
GFR, Estimated: 6 mL/min — ABNORMAL LOW (ref >60)
Glucose, Bld: 108 mg/dL — ABNORMAL HIGH (ref 70–99)
Potassium: 4.6 mmol/L (ref 3.5–5.1)
Sodium: 135 mmol/L (ref 135–145)

## 2023-12-10 MED ORDER — TORSEMIDE 20 MG PO TABS
40.0000 mg | ORAL_TABLET | Freq: Every day | ORAL | 0 refills | Status: DC
  Filled 2023-12-10: qty 60, 30d supply, fill #0

## 2023-12-10 MED ORDER — MIRTAZAPINE 7.5 MG PO TABS
7.5000 mg | ORAL_TABLET | Freq: Every day | ORAL | 0 refills | Status: DC
  Filled 2023-12-10: qty 30, 30d supply, fill #0

## 2023-12-10 MED ORDER — DOXAZOSIN MESYLATE 2 MG PO TABS
1.0000 mg | ORAL_TABLET | Freq: Every day | ORAL | 0 refills | Status: DC
  Filled 2023-12-10: qty 30, 60d supply, fill #0

## 2023-12-10 MED ORDER — DARBEPOETIN ALFA 60 MCG/0.3ML IJ SOSY
60.0000 ug | PREFILLED_SYRINGE | INTRAMUSCULAR | 0 refills | Status: DC
  Filled 2023-12-10: qty 4.2, 98d supply, fill #0

## 2023-12-10 MED ORDER — SEVELAMER CARBONATE 800 MG PO TABS
800.0000 mg | ORAL_TABLET | Freq: Three times a day (TID) | ORAL | 0 refills | Status: DC
  Filled 2023-12-10: qty 90, 30d supply, fill #0

## 2023-12-10 NOTE — Progress Notes (Signed)
 [name] [id number] Liaison Note:  Notified by [facility] manager of patient/family request for [facility] Palliative services at home after discharge.  Please call with any hospice or outpatient palliative care related questions.  Thank you for the opportunity to participate in this patient's care.  Eleanor Nail, LPN [facility] Liaison [phone]

## 2023-12-10 NOTE — Progress Notes (Signed)
 Patient ID: [name], female   DOB: February 09, 1934, 89 y.o.   MRN: [id number] [facility] Progress Note   Assessment/ Plan:   1.  Acute kidney injury on chronic kidney disease stage V: She has made the decision to not undertake renal replacement therapy.  Etiology appears cardiorenal in mechanism with volume overload/CHF decompensation secondary to diuretic nonadherence.    Transitioned from intravenous to oral diuretics; renal function essentially stable/unchanged and being d/c today.   Counseled about adherence to diuretic regimen, low sodium diet and fluid restriction.  2.  Acute exacerbation of congestive heart failure: With previous 2D echo evidence of combined systolic and diastolic congestive heart failure and decompensation secondary to diuretic nonadherence.  Difficult situation with diuresis in the setting of advanced chronic kidney disease and continued fluctuation of renal function.  3.  Hypertension: Elevated blood pressures likely secondary to volume status/CHF decompensation.  Improving with diuresis/adjustment of antihypertensive therapy. 4.  Anemia: Without overt blood loss and appears to be secondary to chronic illness including chronic kidney disease.  Hemoglobin improved status post PRBC transfusion, on ESA. 5.  Bronchial asthma: Resume bronchodilator therapy/home inhalers per primary service.  Subjective:   Denies any acute events overnight and specifically denies any chest pain or shortness of breath.  Right hand swelling resolved.   Objective:   BP (!) 129/52 (BP Location: Left Arm)   Pulse 60   Temp 97.8 F (36.6 C) (Oral)   Resp 18   Ht 5' 3 (1.6 m)   Wt 60.5 kg   SpO2 100%   BMI 23.63 kg/m   Intake/Output Summary (Last 24 hours) at 12/10/2023 1330 Last data filed at 12/10/2023 1304 Gross per 24 hour  Intake 360 ml  Output 1350 ml  Net -990 ml   Weight change:   Physical Exam: Gen: Comfortably resting in bed, awake/alert.  pleasant CVS: Pulse regular rhythm, normal rate, S1 and S2 normal Resp: Poor inspiratory effort with decreased breath sounds over bases.  No distinct rales/wheeze Abd: Soft, flat, nontender, bowel sounds normal Ext: No lower extremity edema with resolution of right upper extremity edema.  Imaging: No results found.  Labs: BMET Recent Labs  Lab 12/04/23 0238 12/05/23 0224 12/06/23 0242 12/07/23 0222 12/08/23 0344 12/09/23 0332 12/10/23 0241  NA 136 138 138 137 135 138 135  K 5.1 4.8 4.7 4.6 4.4 4.4 4.6  CL 107 106 104 103 104 104 101  CO2 17* 19* 20* 19* 20* 22 23  GLUCOSE 94 88 101* 99 106* 97 108*  BUN 75* 79* 78* 83* 80* 80* 85*  CREATININE 5.50* 5.74* 5.74* 6.25* 5.80* 5.82* 6.17*  CALCIUM  8.5* 8.4* 8.3* 8.4* 8.4* 8.3* 8.1*   CBC Recent Labs  Lab 12/04/23 0238 12/05/23 0224 12/05/23 1449 12/06/23 0242  WBC 4.9 4.7 5.4 5.7  HGB 7.8* 7.3* 9.0* 9.8*  HCT 23.8* 22.5* 27.2* 30.2*  MCV 94.1 95.3 94.1 93.2  PLT 222 227 219 232    Medications:     amLODipine   10 mg Oral Daily   budesonide -glycopyrrolate -formoterol   2 puff Inhalation BID   carvedilol   12.5 mg Oral BID   citalopram   10 mg Oral Daily   cyanocobalamin   1,000 mcg Oral Daily   darbepoetin (ARANESP ) injection - DIALYSIS  60 mcg Subcutaneous Q Tue-1800   doxazosin   1 mg Oral QHS   ferrous sulfate   325 mg Oral Q breakfast   hydrALAZINE   100 mg Oral TID   mirtazapine   7.5 mg Oral QHS  montelukast   10 mg Oral Daily   sevelamer  carbonate  800 mg Oral TID WC   sodium bicarbonate   650 mg Oral BID   torsemide   40 mg Oral Daily

## 2023-12-10 NOTE — TOC Transition Note (Signed)
 Transition of Care [facility]) - Discharge Note   Patient Details  Name: [name] MRN: [id number] Date of Birth: 10/06/1933  Transition [facility]) CM/SW Contact:  Andrez JULIANNA George, RN Phone Number: 12/10/2023, 11:35 AM   Clinical Narrative:     Pt is discharging home with resumption of home health services through [facility]. Kelly with [facility] aware of resumption orders.   Walker for home ordered through [facility] and will be delivered to the room.  Outpatient palliative referral sent to [facility] per granddaughter request. [facility] will contact her for the first home visit.  Pt has transportation home.  Final next level of care: Home w Home Health Services Barriers to Discharge: No Barriers Identified   Patient Goals and CMS Choice Patient states their goals for this hospitalization and ongoing recovery are:: return home with grand daughter CMS Medicare.gov Compare Post Acute Care list provided to:: Patient Represenative (must comment) Choice offered to / list presented to :  (granddaughter)      Discharge Placement                       Discharge Plan and Services Additional resources added to the After Visit Summary for   In-house Referral: NA Discharge Planning Services: CM Consult Post Acute Care Choice: Home Health, Resumption of Svcs/PTA Provider          DME Arranged: Vannie rolling DME Agency: [facility] Date DME Agency Contacted: 12/10/23   Representative spoke with at DME Agency: Darlyn HH Arranged: RN, PT, OT [facility] Agency: [facility] Date [facility] Agency Contacted: 12/10/23   Representative spoke with at [facility] Agency: [place]  Social Drivers of Health (SDOH) Interventions SDOH Screenings   Food Insecurity: No Food Insecurity (12/02/2023)  Housing: Low Risk  (12/02/2023)  Transportation Needs: No Transportation Needs (12/02/2023)  Utilities: Not At Risk (12/02/2023)  Alcohol  Screen: Low Risk  (09/21/2023)  Depression (PHQ2-9): Low Risk   (09/21/2023)  Financial Resource Strain: Low Risk  (09/21/2023)  Physical Activity: Sufficiently Active (09/21/2023)  Social Connections: Moderately Isolated (12/02/2023)  Stress: No Stress Concern Present (09/21/2023)  Tobacco Use: Low Risk  (12/02/2023)  Health Literacy: Adequate Health Literacy (09/21/2023)     Readmission Risk Interventions    12/03/2023   11:27 AM 10/24/2023    2:20 PM 10/16/2023    3:51 PM  Readmission Risk Prevention Plan  Transportation Screening Complete Complete Complete  Medication Review Oceanographer) Complete Complete Complete  PCP or Specialist appointment within 3-5 days of discharge Complete Complete Complete  HRI or Home Care Consult Complete Complete Complete  SW Recovery Care/Counseling Consult  Complete Complete  Palliative Care Screening Not Applicable  Not Applicable  Skilled Nursing Facility Not Applicable Complete Complete

## 2023-12-10 NOTE — Progress Notes (Signed)
 AVS completed for discharge packet; placed with chart.  RN completing discharge; patient diagnosed with dementia.

## 2023-12-10 NOTE — Plan of Care (Signed)
  Problem: Health Behavior/Discharge Planning: Goal: Ability to manage health-related needs will improve Outcome: Progressing   Problem: Clinical Measurements: Goal: Ability to maintain clinical measurements within normal limits will improve Outcome: Progressing Goal: Will remain free from infection Outcome: Progressing Goal: Diagnostic test results will improve Outcome: Progressing Goal: Respiratory complications will improve Outcome: Progressing Goal: Cardiovascular complication will be avoided Outcome: Progressing   Problem: Activity: Goal: Risk for activity intolerance will decrease Outcome: Progressing   Problem: Nutrition: Goal: Adequate nutrition will be maintained Outcome: Progressing   Problem: Coping: Goal: Level of anxiety will decrease Outcome: Progressing   Problem: Elimination: Goal: Will not experience complications related to bowel motility Outcome: Progressing Goal: Will not experience complications related to urinary retention Outcome: Progressing   Problem: Pain Managment: Goal: General experience of comfort will improve and/or be controlled Outcome: Progressing   Problem: Safety: Goal: Ability to remain free from injury will improve Outcome: Progressing   Problem: Skin Integrity: Goal: Risk for impaired skin integrity will decrease Outcome: Progressing   Problem: Education: Goal: Ability to demonstrate management of disease process will improve Outcome: Progressing Goal: Ability to verbalize understanding of medication therapies will improve Outcome: Progressing Goal: Individualized Educational Video(s) Outcome: Progressing   Problem: Activity: Goal: Capacity to carry out activities will improve Outcome: Progressing   Problem: Cardiac: Goal: Ability to achieve and maintain adequate cardiopulmonary perfusion will improve Outcome: Progressing

## 2023-12-10 NOTE — Discharge Summary (Signed)
 Physician Discharge Summary   Patient: [name] MRN: [id number] DOB: 04/25/1934  Admit date:     12/02/2023  Discharge date: 12/10/23  Discharge Physician: Reyes VEAR Gaw   PCP: Charlett Apolinar POUR, MD   Recommendations at discharge:   Patient was discharged home on several medication changes. Torsemide  switched from twice daily to daily. She was started on Remeron  while in house to help with appetite Started on doxazosin  and clonidine  stopped due to labile blood pressures.  She should take the doxazosin  at night.  This was started by nephrology team. She was started on sevelamer  3 times daily with meals to help with hyperphosphatemia. She was discharged home with home hospice.  Family told that hospice would reach out to her. She should have follow-up with PCP within the week to ensure hospice is following with her.   Discharge Diagnoses: Principal Problem:   Fluid overload  Resolved Problems:   * No resolved hospital problems. *  Hospital Course: 89 y.o. female with a PMH significant for CHF, CKD 5, anxiety, GERD, hypertension, dyslipidemia presented to the ED with worsening dyspnea and edema for 2 to 3 days prior to admission. - In the ER hypertensive, creatinine 5.01, BUN 75, bicarb 17, hemoglobin 7.4, BNP 95, chest x-ray with cardiomegaly and interstitial edema, small effusions - Admitted with initial diagnosis of acute on chronic combined systolic/diastolic CHF. Started on diuretics, nephrology also following secondary to CKD stage V.  Palliative consulted while in house and working with family as they now favored discharge home with hospice care - Her kidney function remained stable for the weekend prior to discharge.  She had been medically ready for discharge for several days but we were awaiting family decision on home with hospice.  At time of discharge 7/28 I spoke with the family and they are going to move forward with hospice.  Per palliative care hospice will reach  out to them after discharge.  Of note we did make the medication changes listed above under discharge recommendations.  Wait for hospice to set up to make any medication changes.     Assessment & Plan: Acute on chronic combined systolic and diastolic CHF CKD stage V: - Significantly improved.  She did have some issues with right hand swelling yesterday and she did receive a dose of IV diuresis.  She is now back on her oral diuretics per nephrology. -Not a candidate for dialysis. -GDMT limited by CKD 5 -Creatinine in the 5-6 range and has been throughout hospitalization.   Hypertensive urgency: -Resolved, noted on admission. -continue Coreg , hydralazine , amlodipine , clonidine    Elevated troponin:  -Likely from demand ischemia -Likely in the setting of fluid overload.  Patient denies any chest pain.  Continue to monitor on telemetry -Downtrending after admission.   Anemia of CKD: - Baseline hemoglobin between 8-10.   - Back to her baseline hemoglobin.  Will monitor.   Moderate persistent asthma: - Continue home inhalers.    Depression/anxiety/insomnia:  -Continue Celexa  and Ativan  as needed   Mild dementia, cognitive deficits -Noted, at baseline       Consultants: Palliative care, nephrology Procedures performed: None Disposition: Home Diet recommendation:  Discharge Diet Orders (From admission, onward)     Start     Ordered   12/10/23 0000  Diet - low sodium heart healthy        12/10/23 1115           Regular diet DISCHARGE MEDICATION: Allergies as of 12/10/2023  Reactions   Penicillins Hives   Asa [aspirin] Other (See Comments)   Wheezing Acetaminophen  is OK   Fish Allergy Itching, Nausea And Vomiting, Swelling   Swelling of the face        Medication List     STOP taking these medications    cloNIDine  0.1 MG tablet Commonly known as: CATAPRES        TAKE these medications    acetaminophen  325 MG tablet Commonly known as:  TYLENOL  Take 2 tablets (650 mg total) by mouth every 6 (six) hours as needed for mild pain (pain score 1-3) (or Fever >/= 101).   AeroChamber Plus inhaler Use as instructed   albuterol  (2.5 MG/3ML) 0.083% nebulizer solution Commonly known as: PROVENTIL  Take 3 mLs (2.5 mg total) by nebulization every 6 (six) hours as needed for shortness of breath or wheezing.   albuterol  108 (90 Base) MCG/ACT inhaler Commonly known as: VENTOLIN  HFA USE 2 PUFFS EVERY 6 HOURS AS NEEDED FOR WHEEZING.   amLODipine  10 MG tablet Commonly known as: NORVASC  Take 1 tablet (10 mg total) by mouth daily.   Breztri  Aerosphere 160-9-4.8 MCG/ACT Aero inhaler Generic drug: budesonide -glycopyrrolate -formoterol  INHALE 2 PUFFS INTO THE LUNGS TWICE DAILY, IN THE MORNING & AT BEDTIME.   Calamine Plus 1-8 % Lotn Generic drug: diphenhydramine -calamine On lesion  bid as needed What changed:  how much to take how to take this when to take this reasons to take this additional instructions   carvedilol  12.5 MG tablet Commonly known as: COREG  Take 1 tablet (12.5 mg total) by mouth 2 (two) times daily.   citalopram  20 MG tablet Commonly known as: CELEXA  Take 0.5 tablets (10 mg total) by mouth daily.   cyanocobalamin  1000 MCG tablet Commonly known as: VITAMIN B12 Take 1,000 mcg by mouth daily.   Darbepoetin Alfa  60 MCG/0.3ML Sosy injection Commonly known as: ARANESP  Inject 0.3 mLs (60 mcg total) into the skin every Tuesday at 6 PM. Start taking on: December 11, 2023   doxazosin  2 MG tablet Commonly known as: CARDURA  Take 0.5 tablets (1 mg total) by mouth at bedtime.   feeding supplement (OSMOLITE 1.2 CAL) Liqd Take 237 mLs by mouth 2 (two) times daily between meals.   ferrous sulfate  325 (65 FE) MG EC tablet Take 1 tablet (325 mg total) by mouth daily with breakfast.   fluticasone  50 MCG/ACT nasal spray Commonly known as: FLONASE  Place 2 sprays into both nostrils as needed for allergies or rhinitis.    GenTeal Tears 0.1-0.2-0.3 % Soln Place 2 drops into both eyes as needed.   hydrALAZINE  100 MG tablet Commonly known as: APRESOLINE  Take 1 tablet (100 mg total) by mouth 3 (three) times daily.   lidocaine  5 % Commonly known as: LIDODERM  Place 1 patch onto the skin daily. Remove & Discard patch within 12 hours or as directed by MD   LORazepam  0.5 MG tablet Commonly known as: ATIVAN  Take 1 tablet (0.5 mg total) by mouth 2 (two) times daily as needed. for anxiety   mirtazapine  7.5 MG tablet Commonly known as: REMERON  Take 1 tablet (7.5 mg total) by mouth at bedtime.   montelukast  10 MG tablet Commonly known as: SINGULAIR  Take 1 tablet (10 mg total) by mouth daily.   sevelamer  carbonate 800 MG tablet Commonly known as: RENVELA  Take 1 tablet (800 mg total) by mouth 3 (three) times daily with meals.   sodium bicarbonate  650 MG tablet Take 1 tablet (650 mg total) by mouth 2 (two) times daily.  torsemide  20 MG tablet Commonly known as: DEMADEX  Take 2 tablets (40 mg total) by mouth daily. Start taking on: December 11, 2023 What changed:  medication strength when to take this   Vitamin D  (Cholecalciferol ) 25 MCG (1000 UT) Tabs Take 1,000 Units by mouth daily.        Discharge Exam: Filed Weights   12/06/23 0511 12/07/23 0636 12/08/23 0656  Weight: 60.5 kg 60.6 kg 60.5 kg   Gen: 89 y.o. female in no apparent distress.  Nontoxic, awake, alert, pleasant Pulm: Non-labored breathing.  Clear to auscultation bilaterally.  CV: Regular rate and rhythm. GI: Abdomen soft, non-tender, non-distended Ext: Warm, no deformities, trace bilateral pedal edema, with +1 right hand edema noted Skin: No rashes, lesions  Neuro: Alert and oriented to person and place and why she is in the hospital. No focal neurological deficits. Psych: Calm  Judgement and insight appear normal. Mood & affect appropriate.    Condition at discharge: good  The results of significant diagnostics from this  hospitalization (including imaging, microbiology, ancillary and laboratory) are listed below for reference.   Imaging Studies: DG Chest Portable 1 View Result Date: 12/02/2023 EXAM: 1 VIEW XRAY OF THE CHEST 12/02/2023 03:03:00 AM COMPARISON: 11/30/2023 CLINICAL HISTORY: Chest pain, shortness of breath with feet and ankles swollen for 2 days. FINDINGS: LUNGS AND PLEURA: Mild interstitial edema. Possible small bilateral pleural effusions. No pneumothorax. HEART AND MEDIASTINUM: Cardiomegaly. BONES AND SOFT TISSUES: No acute osseous abnormality. IMPRESSION: 1. Cardiomegaly with mild interstitial edema. 2. Possible small bilateral pleural effusions. Electronically signed by: Pinkie Pebbles MD 12/02/2023 03:05 AM EDT RP Workstation: [id number]   DG Chest 2 View Result Date: 11/30/2023 CLINICAL DATA:  Chest pain EXAM: CHEST - 2 VIEW COMPARISON:  10/15/2023 FINDINGS: Cardiac shadow is enlarged. Increased central vascular congestion is noted. Patchy airspace opacities are noted bilaterally likely related to edema. No focal confluent infiltrate is seen. No bony abnormality is noted. IMPRESSION: Increased patchy opacities likely related to edema. Electronically Signed   By: Oneil Devonshire M.D.   On: 11/30/2023 00:13    Microbiology: Results for orders placed or performed in visit on 11/08/23  Culture, blood (single) w Reflex to ID Panel     Status: None   Collection Time: 11/08/23 10:35 AM   Specimen: Vein; Blood  Result Value Ref Range Status   MICRO NUMBER: [id number]  Final   SPECIMEN QUALITY: Adequate  Final   Source BLOOD 1  Final   STATUS: FINAL  Final   Result: No growth after 5 days  Final   COMMENT: Aerobic and anaerobic bottle received.  Final   *Note: Due to a large number of results and/or encounters for the requested time period, some results have not been displayed. A complete set of results can be found in Results Review.    Labs: CBC: Recent Labs  Lab 12/04/23 0238 12/05/23 0224  12/05/23 1449 12/06/23 0242  WBC 4.9 4.7 5.4 5.7  HGB 7.8* 7.3* 9.0* 9.8*  HCT 23.8* 22.5* 27.2* 30.2*  MCV 94.1 95.3 94.1 93.2  PLT 222 227 219 232   Basic Metabolic Panel: Recent Labs  Lab 12/06/23 0242 12/07/23 0222 12/08/23 0344 12/09/23 0332 12/10/23 0241  NA 138 137 135 138 135  K 4.7 4.6 4.4 4.4 4.6  CL 104 103 104 104 101  CO2 20* 19* 20* 22 23  GLUCOSE 101* 99 106* 97 108*  BUN 78* 83* 80* 80* 85*  CREATININE 5.74* 6.25* 5.80* 5.82* 6.17*  CALCIUM   8.3* 8.4* 8.4* 8.3* 8.1*   Liver Function Tests: No results for input(s): AST, ALT, ALKPHOS, BILITOT, PROT, ALBUMIN  in the last 168 hours. CBG: No results for input(s): GLUCAP in the last 168 hours.  Discharge time spent: less than 30 minutes.  Signed: Reyes VEAR Gaw, MD [facility] 12/10/2023

## 2023-12-11 ENCOUNTER — Telehealth: Payer: Self-pay

## 2023-12-11 NOTE — Telephone Encounter (Unsigned)
 Copied from CRM #[id number]. Topic: General - Other >> Dec 11, 2023 10:07 AM Pinkey ORN wrote: Reason for CRM: [facility] >> Dec 11, 2023 10:09 AM Pinkey ORN wrote: Tonya [facility]' [phone]  Called on behalf of patient, states they're doing palliative services and will following the patient.

## 2023-12-11 NOTE — Transitions of Care (Post Inpatient/ED Visit) (Signed)
   12/11/2023  Name: [name] MRN: [id number] DOB: [birth year]  Today's TOC FU Call Status: Today's TOC FU Call Status:: Unsuccessful Call (1st Attempt) Unsuccessful Call (1st Attempt) Date: 12/11/23  Attempted to reach the patient regarding the most recent Inpatient/ED visit.  Follow Up Plan: Additional outreach attempts will be made to reach the patient to complete the Transitions of Care (Post Inpatient/ED visit) call.   Shona Prow RN, CCM [place]  VBCI-Population Health RN Care Manager [phone]

## 2023-12-11 NOTE — Progress Notes (Deleted)
 No chief complaint on file.   HPI: [name] 89 y.o. come in for Chronic disease management  H[facility] 20 - 28   Patient: [name] MRN: [id number] DOB: 22-Jan-1934  Admit date:     12/02/2023  Discharge date: 12/10/23  Discharge Physician: Reyes VEAR Gaw    PCP: Charlett Apolinar POUR, MD    Recommendations at discharge:    Patient was discharged home on several medication changes. Torsemide  switched from twice daily to daily. She was started on Remeron  while in house to help with appetite Started on doxazosin  and clonidine  stopped due to labile blood pressures.  She should take the doxazosin  at night.  This was started by nephrology team. She was started on sevelamer  3 times daily with meals to help with hyperphosphatemia. She was discharged home with home hospice.  Family told that hospice would reach out to her. She should have follow-up with PCP within the week to ensure hospice is following with her.   ROS: See pertinent positives and negatives per HPI.  Past Medical History:  Diagnosis Date   Acute superficial venous thrombosis of left lower extremity 03/27/2014   concern about hx and potential extensive on exam tenderness calf and low dose lovenox  40 qd 2-4 weekselevetion and close  fu advised .    Allergy    Anemia    Anxiety    Arthritis    shoulders (09/09/2012)   Asthma 09/08/2012   Carotid bruit    us  2018  low risk 1 - 39%   Cataract    bil cateracts removed   CKD (chronic kidney disease) stage 5, GFR less than 15 ml/min (HCC)    as of may 2025, creat 4.5- 5.5 range   Clotting disorder (HCC)    blood clots in legs   Diabetes mellitus without complication (HCC)    Borderline per pt; being monitored.  no meds per pt   Diverticulosis    Dyspnea 04/27/2014   GERD (gastroesophageal reflux disease)    Headache(784.0)    related to my high blood pressure (09/09/2012)   Hiatal hernia    History of DVT (deep vein thrombosis)    RLE (09/09/2012)  coumadine cant take asa so on plavix     HTN (hypertension) 09/08/2012   Hyperlipidemia    Lupus    cured years ago (09/09/2012)   Other dysphagia 02/11/2013   Syncope and collapse 01/21/2018   Varicose veins    Varicose veins of leg with complications 12/05/2010    Family History  Problem Relation Age of Onset   Hypertension Mother    Lung cancer Mother    Hypertension Father    Lung cancer Father    Breast cancer Sister    Colon cancer Sister        ? 46' s dx - died in 74's   Breast cancer Sister    Hypertension Brother    Rectal cancer Brother    Stomach cancer Brother    Breast cancer Brother    Heart attack Daughter    Esophageal cancer Daughter    Hypertension Daughter    Diabetes Daughter    Breast cancer Paternal Aunt    Lung cancer Other        both parents    Pancreatic cancer Neg Hx     Social History   Socioeconomic History   Marital status: Single    Spouse name: Not on file   Number of children: 6   Years  of education: 9   Highest education level: Not on file  Occupational History   Occupation: retired  Tobacco Use   Smoking status: Never   Smokeless tobacco: Never  Vaping Use   Vaping status: Never Used  Substance and Sexual Activity   Alcohol  use: No   Drug use: No   Sexual activity: Not Currently    Birth control/protection: Surgical  Other Topics Concern   Not on file  Social History Narrative   2 people living in the home.  Grand daughter.Up and down through the nightHad 7 children  2 deceased . 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  Neg tad    Left handed   Drinks caffeine prn    Granddaughter lives with her   One floor home   Social Drivers of Health   Financial Resource Strain: Low Risk  (09/21/2023)   Overall Financial Resource Strain (CARDIA)    Difficulty of Paying Living Expenses: Not hard at all   Food Insecurity: No Food Insecurity (12/02/2023)   Hunger Vital Sign    Worried About Running Out of Food in the Last Year: Never true    Ran Out of Food in the Last Year: Never true  Transportation Needs: No Transportation Needs (12/02/2023)   PRAPARE - Administrator, Civil Service (Medical): No    Lack of Transportation (Non-Medical): No  Physical Activity: Sufficiently Active (09/21/2023)   Exercise Vital Sign    Days of Exercise per Week: 5 days    Minutes of Exercise per Session: 30 min  Stress: No Stress Concern Present (09/21/2023)   [employer] of Occupational Health - Occupational Stress Questionnaire    Feeling of Stress : Not at all  Social Connections: Moderately Isolated (12/02/2023)   Social Connection and Isolation Panel    Frequency of Communication with Friends and Family: More than three times a week    Frequency of Social Gatherings with Friends and Family: More than three times a week    Attends Religious Services: More than 4 times per year    Active Member of [employer] or Organizations: No    Attends Banker Meetings: Never    Marital Status: Widowed    Outpatient Medications Prior to Visit  Medication Sig Dispense Refill   acetaminophen  (TYLENOL ) 325 MG tablet Take 2 tablets (650 mg total) by mouth every 6 (six) hours as needed for mild pain (pain score 1-3) (or Fever >/= 101). 20 tablet 0   albuterol  (PROVENTIL ) (2.5 MG/3ML) 0.083% nebulizer solution Take 3 mLs (2.5 mg total) by nebulization every 6 (six) hours as needed for shortness of breath or wheezing. 360 mL 6   albuterol  (VENTOLIN  HFA) 108 (90 Base) MCG/ACT inhaler USE 2 PUFFS EVERY 6 HOURS AS NEEDED FOR WHEEZING. 8.5 g 3   amLODipine  (NORVASC ) 10 MG tablet Take 1 tablet (10 mg total) by mouth daily. 30 tablet 1   Artificial Tear Solution (GENTEAL TEARS) 0.1-0.2-0.3 % SOLN Place 2 drops into both eyes as needed. 15 mL 0   Budeson-Glycopyrrol-Formoterol  (BREZTRI  AEROSPHERE) 160-9-4.8  MCG/ACT AERO INHALE 2 PUFFS INTO THE LUNGS TWICE DAILY, IN THE MORNING & AT BEDTIME. 10.7 g 11   carvedilol  (COREG ) 12.5 MG tablet Take 1 tablet (12.5 mg total) by mouth 2 (two) times daily. 180 tablet 3   citalopram  (CELEXA ) 20 MG tablet Take 0.5  tablets (10 mg total) by mouth daily. 45 tablet 1   Darbepoetin Alfa  (ARANESP ) 60 MCG/0.3ML SOSY injection Inject 0.3 mLs (60 mcg total) into the skin every Tuesday at 6 PM. 4.2 mL 0   diphenhydramine -calamine (CALAMINE PLUS) 1-8 % LOTN On lesion  bid as needed (Patient taking differently: Apply 1 application  topically daily as needed (for itching).) 177 mL 0   doxazosin  (CARDURA ) 2 MG tablet Take 0.5 tablets (1 mg total) by mouth at bedtime. 30 tablet 0   ferrous sulfate  325 (65 FE) MG EC tablet Take 1 tablet (325 mg total) by mouth daily with breakfast. 30 tablet 3   fluticasone  (FLONASE ) 50 MCG/ACT nasal spray Place 2 sprays into both nostrils as needed for allergies or rhinitis.     hydrALAZINE  (APRESOLINE ) 100 MG tablet Take 1 tablet (100 mg total) by mouth 3 (three) times daily. 270 tablet 2   lidocaine  (LIDODERM ) 5 % Place 1 patch onto the skin daily. Remove & Discard patch within 12 hours or as directed by MD 30 patch 0   LORazepam  (ATIVAN ) 0.5 MG tablet Take 1 tablet (0.5 mg total) by mouth 2 (two) times daily as needed. for anxiety 10 tablet 0   mirtazapine  (REMERON ) 7.5 MG tablet Take 1 tablet (7.5 mg total) by mouth at bedtime. 30 tablet 0   montelukast  (SINGULAIR ) 10 MG tablet Take 1 tablet (10 mg total) by mouth daily. 90 tablet 3   Nutritional Supplements (FEEDING SUPPLEMENT, NEPRO CARB STEADY,) LIQD Take 237 mLs by mouth 2 (two) times daily between meals. 2370 mL 0   sevelamer  carbonate (RENVELA ) 800 MG tablet Take 1 tablet (800 mg total) by mouth 3 (three) times daily with meals. 90 tablet 0   sodium bicarbonate  650 MG tablet Take 1 tablet (650 mg total) by mouth 2 (two) times daily. 60 tablet 0   Spacer/Aero-Holding Chambers (AEROCHAMBER  PLUS) inhaler Use as instructed 1 each 2   torsemide  (DEMADEX ) 20 MG tablet Take 2 tablets (40 mg total) by mouth daily. 60 tablet 0   vitamin B-12 (CYANOCOBALAMIN ) 1000 MCG tablet Take 1,000 mcg by mouth daily.     Vitamin D , Cholecalciferol , 25 MCG (1000 UT) TABS Take 1,000 Units by mouth daily. 60 tablet 2   No facility-administered medications prior to visit.     EXAM:  There were no vitals taken for this visit.  There is no height or weight on file to calculate BMI.  GENERAL: vitals reviewed and listed above, alert, oriented, appears well hydrated and in no acute distress HEENT: atraumatic, conjunctiva  clear, no obvious abnormalities on inspection of external nose and ears OP : no lesion edema or exudate  NECK: no obvious masses on inspection palpation  LUNGS: clear to auscultation bilaterally, no wheezes, rales or rhonchi, good air movement CV: HRRR, no clubbing cyanosis or  peripheral edema nl cap refill  MS: moves all extremities without noticeable focal  abnormality PSYCH: pleasant and cooperative, no obvious depression or anxiety Lab Results  Component Value Date   WBC 5.7 12/06/2023   HGB 9.8 (L) 12/06/2023   HCT 30.2 (L) 12/06/2023   PLT 232 12/06/2023   GLUCOSE 108 (H) 12/10/2023   CHOL 190 07/21/2022   TRIG 97 07/21/2022   HDL 92 07/21/2022   LDLCALC 81 07/21/2022   ALT 7 10/24/2023   AST 14 (L) 10/24/2023   NA 135 12/10/2023   K 4.6 12/10/2023   CL 101 12/10/2023   CREATININE 6.17 (H) 12/10/2023  BUN 85 (H) 12/10/2023   CO2 23 12/10/2023   TSH 3.573 12/02/2023   INR 1.0 11/29/2023   HGBA1C 5.1 09/22/2023   MICROALBUR 119 03/02/2021   BP Readings from Last 3 Encounters:  12/10/23 (!) 129/52  11/29/23 (!) 172/67  11/08/23 (!) 181/65    ASSESSMENT AND PLAN:  Discussed the following assessment and plan:  No diagnosis found.  -Patient advised to return or notify health care team  if  new concerns arise.  There are no Patient Instructions on file  for this visit.   Karlos Scadden K. Kaidynce Pfister M.D.

## 2023-12-12 ENCOUNTER — Inpatient Hospital Stay: Admitting: Internal Medicine

## 2023-12-12 ENCOUNTER — Telehealth: Payer: Self-pay

## 2023-12-12 NOTE — Transitions of Care (Post Inpatient/ED Visit) (Signed)
 12/12/2023  Name: [name] MRN: [id number] DOB: Aug 31, 1933  Today's TOC FU Call Status: Today's TOC FU Call Status:: Successful TOC FU Call Completed TOC FU Call Complete Date: 12/12/23 Patient's Name and Date of Birth confirmed.  Transition Care Management Follow-up Telephone Call Date of Discharge: 12/10/23 Discharge Facility: [facility]) Type of Discharge: Inpatient Admission Primary Inpatient Discharge Diagnosis:: Fluid overload. How have you been since you were released from the hospital?: Same (does not want to take pills.) Any questions or concerns?: No  Items Reviewed: Did you receive and understand the discharge instructions provided?: Yes Medications obtained,verified, and reconciled?: Yes (Medications Reviewed) Any new allergies since your discharge?: No Dietary orders reviewed?: Yes Type of Diet Ordered:: low salt diet Do you have support at home?: Yes People in Home [RPT]: grandchild(ren) Name of Support/Comfort Primary Source: Granddaughter  Medications Reviewed Today: Medications Reviewed Today     Reviewed by Rumalda Alan PENNER, RN (Registered Nurse) on 12/12/23 at 1239  Med List Status: <None>   Medication Order Taking? Sig Documenting Provider Last Dose Status Informant  acetaminophen  (TYLENOL ) 325 MG tablet [id number]  Take 2 tablets (650 mg total) by mouth every 6 (six) hours as needed for mild pain (pain score 1-3) (or Fever >/= 101).  Patient not taking: Reported on 12/12/2023   Regalado, Belkys A, MD  Active Pharmacy Records, Self  albuterol  (PROVENTIL ) (2.5 MG/3ML) 0.083% nebulizer solution [id number]  Take 3 mLs (2.5 mg total) by nebulization every 6 (six) hours as needed for shortness of breath or wheezing.  Patient not taking: Reported on 12/12/2023   Enedelia Dorna HERO, FNP  Active Pharmacy Records, Self  albuterol  (VENTOLIN  HFA) [phone] Base) MCG/ACT inhaler [id number]  USE 2 PUFFS EVERY 6 HOURS AS NEEDED FOR WHEEZING.  Patient not taking:  Reported on 12/12/2023   Shelah Lamar RAMAN, MD  Active Self, Pharmacy Records  amLODipine  (NORVASC ) 10 MG tablet [id number]  Take 1 tablet (10 mg total) by mouth daily.  Patient not taking: Reported on 12/12/2023   Verdene Purchase, MD  Active Pharmacy Records, Self           Med Note [facility], DONETA RAMAN Repress Dec 02, 2023  4:27 AM)    Artificial Tear Solution (GENTEAL TEARS) 0.1-0.2-0.3 % SOLN [id number]  Place 2 drops into both eyes as needed.  Patient not taking: Reported on 12/12/2023   Sherrill Alejandro Donovan, DO  Active Self, Pharmacy Records  Budeson-Glycopyrrol-Formoterol  (BREZTRI  AEROSPHERE) 160-9-4.8 MCG/ACT AERO [id number]  INHALE 2 PUFFS INTO THE LUNGS TWICE DAILY, IN THE MORNING & AT BEDTIME.  Patient not taking: Reported on 12/12/2023   Shelah Lamar RAMAN, MD  Active Pharmacy Records, Self  carvedilol  (COREG ) 12.5 MG tablet [id number]  Take 1 tablet (12.5 mg total) by mouth 2 (two) times daily.  Patient not taking: Reported on 12/12/2023   Nahser, Aleene PARAS, MD  Active Pharmacy Records, Self           Med Note [facility], DONETA RAMAN Repress Dec 02, 2023  4:30 AM)    citalopram  (CELEXA ) 20 MG tablet [id number]  Take 0.5 tablets (10 mg total) by mouth daily.  Patient not taking: Reported on 12/12/2023   Panosh, Wanda K, MD  Active Pharmacy Records, Self  Darbepoetin Alfa  (ARANESP ) 60 MCG/0.3ML SOSY injection [id number]  Inject 0.3 mLs (60 mcg total) into the skin every Tuesday at 6 PM.  Patient not taking: Reported on 12/12/2023   Elpidio Reyes DEL, MD  Active  diphenhydramine -calamine (CALAMINE PLUS) 1-8 % LOTN [id number]  On lesion  bid as needed  Patient not taking: Reported on 12/12/2023   Dennise Kingsley, MD  Active Self, Pharmacy Records  doxazosin  (CARDURA ) 2 MG tablet [id number]  Take 0.5 tablets (1 mg total) by mouth at bedtime.  Patient not taking: Reported on 12/12/2023   Elpidio Reyes DEL, MD  Active   ferrous sulfate  325 (65 FE) MG EC tablet [id number]  Take 1 tablet (325 mg total) by mouth daily with  breakfast.  Patient not taking: Reported on 12/12/2023   Panosh, Wanda K, MD  Active Pharmacy Records, Self  fluticasone  (FLONASE ) 50 MCG/ACT nasal spray [id number]  Place 2 sprays into both nostrils as needed for allergies or rhinitis.  Patient not taking: Reported on 12/12/2023   [provider]  Active Pharmacy Records, Self  hydrALAZINE  (APRESOLINE ) 100 MG tablet [id number]  Take 1 tablet (100 mg total) by mouth 3 (three) times daily.  Patient not taking: Reported on 12/12/2023   Nahser, Aleene PARAS, MD  Active Pharmacy Records, Self           Med Note [facility], DONETA GORMAN Repress Dec 02, 2023  4:32 AM)    lidocaine  (LIDODERM ) 5 % [id number]  Place 1 patch onto the skin daily. Remove & Discard patch within 12 hours or as directed by MD  Patient not taking: Reported on 12/12/2023   Regalado, Belkys A, MD  Active Pharmacy Records, Self  LORazepam  (ATIVAN ) 0.5 MG tablet [id number] Yes Take 1 tablet (0.5 mg total) by mouth 2 (two) times daily as needed. for anxiety Sherrill Cable Latif, DO  Active Self, Pharmacy Records  mirtazapine  (REMERON ) 7.5 MG tablet [id number]  Take 1 tablet (7.5 mg total) by mouth at bedtime.  Patient not taking: Reported on 12/12/2023   Elpidio Reyes DEL, MD  Active   montelukast  (SINGULAIR ) 10 MG tablet [id number]  Take 1 tablet (10 mg total) by mouth daily.  Patient not taking: Reported on 12/12/2023   Shelah Lamar GORMAN, MD  Active Pharmacy Records, Self  Nutritional Supplements (FEEDING SUPPLEMENT, NEPRO CARB STEADY,) LIQD [id number]  Take 237 mLs by mouth 2 (two) times daily between meals.  Patient not taking: Reported on 12/12/2023   Sherrill Cable Donovan, DO  Active Self, Pharmacy Records  sevelamer  carbonate (RENVELA ) 800 MG tablet [id number]  Take 1 tablet (800 mg total) by mouth 3 (three) times daily with meals.  Patient not taking: Reported on 12/12/2023   Elpidio Reyes DEL, MD  Active   sodium bicarbonate  650 MG tablet [id number]  Take 1 tablet (650 mg total) by mouth 2 (two)  times daily.  Patient not taking: Reported on 12/12/2023   Regalado, Belkys A, MD  Active Pharmacy Records, Self  Spacer/Aero-Holding Chambers (AEROCHAMBER PLUS) inhaler [id number]  Use as instructed  Patient not taking: Reported on 12/12/2023   Van Knee, MD  Active Pharmacy Records, Self  torsemide  (DEMADEX ) 20 MG tablet [id number]  Take 2 tablets (40 mg total) by mouth daily.  Patient not taking: Reported on 12/12/2023   Elpidio Reyes DEL, MD  Active   vitamin B-12 (CYANOCOBALAMIN ) 1000 MCG tablet [id number]  Take 1,000 mcg by mouth daily.  Patient not taking: Reported on 12/12/2023   [provider]  Active Pharmacy Records, Self  Vitamin D , Cholecalciferol , 25 MCG (1000 UT) TABS [id number]  Take 1,000 Units by mouth daily.  Patient not taking: Reported on 12/12/2023   Douglass Kenney NOVAK, FNP  Active Pharmacy Records, Self  Home Care and Equipment/Supplies: Were Home Health Services Ordered?: Yes Name of Home Health Agency:: [facility] Has Agency set up a time to come to your home?: Yes (agency called and grandaughter will call back. Provided granddaughter with phone number.) Any new equipment or medical supplies ordered?: No  Functional Questionnaire: Do you need assistance with bathing/showering or dressing?: No Do you need assistance with meal preparation?: Yes Do you need assistance with eating?: No Do you have difficulty maintaining continence: No Do you need assistance with getting out of bed/getting out of a chair/moving?: No Do you have difficulty managing or taking your medications?: Yes (patient refused to take her medication)  Follow up appointments reviewed: PCP Follow-up appointment confirmed?: Yes Date of PCP follow-up appointment?: 12/12/23 Follow-up Provider: Did not attend follow up that is scheduled for today Specialist Hospital Follow-up appointment confirmed?: Yes Date of Specialist follow-up appointment?: 01/01/24 Follow-Up Specialty  Provider:: Powell Lessen Do you need transportation to your follow-up appointment?: No Do you understand care options if your condition(s) worsen?: Yes-patient verbalized understanding  SDOH Interventions Today    Flowsheet Row Most Recent Value  SDOH Interventions   Food Insecurity Interventions Intervention Not Indicated  Housing Interventions Intervention Not Indicated  Transportation Interventions Intervention Not Indicated  Utilities Interventions Intervention Not Indicated   Call with granddaughter,  Lonie Rummell.   Patient lives with granddaughter.  Granddaughter reports that patient will not take her medications at home. Will only take Ativan  if needed.  Granddaughter and family do not know what to do.  Currently granddaughter denies any swelling or shortness of breath. Reports worsening memory with patient.  Patient had a hospital follow up planned for today and patient refused to go and stated that she was tired.  Granddaughter reports home  health called yesterday and she will call them back today.  Reviewed with granddaughter that patient had accepted palliative care. I provided contact information for home health and palliative care.  Granddaughter states that family can not force patient to take medication or weigh.  They don't know what to do.   Reviewed 30 day TOC program with granddaughter and she has agreed to program.    Goals Addressed             This Visit's Progress    [facility] Transitions of Care (TOC) Care Plan       Problems:  Recent Hospitalization for treatment of fluid overload.  12/12/2023  Granddaughter denies any swelling or shortness of breath at this time.  Patient refused to take her medications at home. 12/12/2023  Granddaughter reports that patient refused to take her medications at home.  Reports that patient will take her medications in the hospital but not at home.  Missed hospital follow up today.  12/12/2023  missed appointment today. Patient told  granddaughter that she was too tired. Pending start of  home health and palliative care.   Goal:  Over the next 30 days, the patient will not experience hospital readmission  Interventions:  Transitions of Care: Doctor Visits  - discussed the importance of doctor visits Reviewed Signs and symptoms of infection Encouraged granddaughter to continue to try to get patient to take her medications, Reviewed applesauce of liquid form of medications. Granddaughter to discuss with MD Provided grandaughter with contact number of home health and palliative care. Encouraged granddaughter to call these agencies back.  Encouraged patient to see PCP for follow - patient refused to go today.  Heart Failure Interventions: Basic overview and discussion of pathophysiology  of Heart Failure reviewed Provided education on low sodium diet Assessed need for readable accurate scales in home Provided education about placing scale on hard, flat surface Advised patient to weigh each morning after emptying bladder Discussed importance of daily weight and advised patient to weigh and record daily Reviewed role of diuretics in prevention of fluid overload and management of heart failure; Discussed the importance of keeping all appointments with provider Assessed social determinant of health barriers  Message sent to MD  Patient Self Care Activities:  Attend all scheduled provider appointments Call pharmacy for medication refills 3-7 days in advance of running out of medications Call provider office for new concerns or questions  Notify RN Care Manager of TOC call rescheduling needs Participate in Transition of Care Program/Attend TOC scheduled calls Take medications as prescribed   call office if I gain more than 2 pounds in one day or 5 pounds in one week keep legs up while sitting watch for swelling in feet, ankles and legs every day weigh myself daily follow rescue plan if symptoms flare-up know when to  call the doctor:Weight gain, shortness of breath  Plan:  Telephone follow up appointment with care management team member scheduled for:  12/19/2023 The patient has been provided with contact information for the care management team and has been advised to call with any health related questions or concerns.         Alan Ee, RN, BSN, CEN [employer]- Transition of Care Team.  [facility] [phone]

## 2023-12-19 ENCOUNTER — Telehealth: Payer: Self-pay

## 2023-12-19 NOTE — Telephone Encounter (Signed)
 Copied from CRM #[id number]. Topic: General - Other >> Dec 18, 2023  9:04 AM Henretta I wrote: Reason for CRM: Angelia Rn with homehealth was calling to advise there was a Delay in start of care she tried to go on Saturday but granddaughter would not allow and they now have to start care on this Friday.

## 2023-12-20 ENCOUNTER — Telehealth: Payer: Self-pay

## 2023-12-20 NOTE — Telephone Encounter (Signed)
-----   Message from [name] sent at 12/17/2023 10:14 PM EDT ----- UP to patient to come , she has been under multi specialty care . And many care visits  However would be ok to do a virtual visit with daughter and her .  WP ----- Message ----- From: Rumalda Alan PENNER, RN Sent: 12/12/2023   1:15 PM EDT To: Angela MARLA Eastern, MD  Patient was supposed to see you today and refused to go. Granddaughter does not know what to do. Patient will not take her medications at home.    Can granddaughter and patient do a virtual visit.  Goals of care meeting occurred during this hospitalization.  S - Patient was recently discharged on   7/28.We are notifying you that the patient has been enrolled in our Transitions of Care (TOC) program to support their post-discharge recovery and reduce the risk of readmission. B - The patient was admitted for   fluid overload   .The [facility] program includes follow-up calls, medication reconciliation, and coordination of follow-up appointments. A - The patient is currently stable. Our team will monitor their progress and provide support as needed. R - Please be aware of the patient's enrollment in the [facility] program. If you have any questions or need additional information, feel free to contact me.  Alan Rumalda, RN, BSN, [employer]- Transition of Care Team.  [phone]

## 2023-12-20 NOTE — Telephone Encounter (Signed)
 Attempted to reach pt. Left a voicemail to call us back.

## 2023-12-25 ENCOUNTER — Ambulatory Visit: Payer: Medicare Other | Admitting: Physician Assistant

## 2023-12-25 NOTE — Telephone Encounter (Signed)
 Pt has in person visit on 01/09/2024. 2nd attempt to call if would like keep OV or do virtual. Left a voicemail to call us  back.

## 2023-12-28 ENCOUNTER — Emergency Department (HOSPITAL_COMMUNITY)

## 2023-12-28 ENCOUNTER — Encounter (HOSPITAL_COMMUNITY): Payer: Self-pay

## 2023-12-28 ENCOUNTER — Inpatient Hospital Stay (HOSPITAL_COMMUNITY)
Admission: EM | Admit: 2023-12-28 | Discharge: 2024-01-05 | DRG: 291 | Disposition: A | Discharge status: Hospice | Attending: Internal Medicine | Admitting: Internal Medicine

## 2023-12-28 ENCOUNTER — Other Ambulatory Visit: Payer: Self-pay

## 2023-12-28 DIAGNOSIS — N185 Chronic kidney disease, stage 5: Secondary | ICD-10-CM | POA: Diagnosis present

## 2023-12-28 DIAGNOSIS — E8770 Fluid overload, unspecified: Secondary | ICD-10-CM | POA: Diagnosis not present

## 2023-12-28 DIAGNOSIS — Z8249 Family history of ischemic heart disease and other diseases of the circulatory system: Secondary | ICD-10-CM

## 2023-12-28 DIAGNOSIS — F03A4 Unspecified dementia, mild, with anxiety: Secondary | ICD-10-CM | POA: Diagnosis present

## 2023-12-28 DIAGNOSIS — K219 Gastro-esophageal reflux disease without esophagitis: Secondary | ICD-10-CM | POA: Diagnosis present

## 2023-12-28 DIAGNOSIS — I10 Essential (primary) hypertension: Secondary | ICD-10-CM | POA: Diagnosis not present

## 2023-12-28 DIAGNOSIS — R609 Edema, unspecified: Secondary | ICD-10-CM | POA: Diagnosis not present

## 2023-12-28 DIAGNOSIS — R0902 Hypoxemia: Secondary | ICD-10-CM | POA: Diagnosis present

## 2023-12-28 DIAGNOSIS — Z800 Family history of malignant neoplasm of digestive organs: Secondary | ICD-10-CM

## 2023-12-28 DIAGNOSIS — H1131 Conjunctival hemorrhage, right eye: Secondary | ICD-10-CM | POA: Diagnosis present

## 2023-12-28 DIAGNOSIS — W19XXXA Unspecified fall, initial encounter: Secondary | ICD-10-CM | POA: Diagnosis present

## 2023-12-28 DIAGNOSIS — D631 Anemia in chronic kidney disease: Secondary | ICD-10-CM | POA: Diagnosis not present

## 2023-12-28 DIAGNOSIS — Z91148 Patient's other noncompliance with medication regimen for other reason: Secondary | ICD-10-CM | POA: Diagnosis not present

## 2023-12-28 DIAGNOSIS — R918 Other nonspecific abnormal finding of lung field: Secondary | ICD-10-CM | POA: Diagnosis not present

## 2023-12-28 DIAGNOSIS — Z66 Do not resuscitate: Secondary | ICD-10-CM | POA: Diagnosis not present

## 2023-12-28 DIAGNOSIS — S0990XA Unspecified injury of head, initial encounter: Secondary | ICD-10-CM | POA: Diagnosis not present

## 2023-12-28 DIAGNOSIS — S0003XA Contusion of scalp, initial encounter: Secondary | ICD-10-CM | POA: Diagnosis present

## 2023-12-28 DIAGNOSIS — Z1152 Encounter for screening for COVID-19: Secondary | ICD-10-CM

## 2023-12-28 DIAGNOSIS — Z515 Encounter for palliative care: Secondary | ICD-10-CM

## 2023-12-28 DIAGNOSIS — R9082 White matter disease, unspecified: Secondary | ICD-10-CM | POA: Diagnosis not present

## 2023-12-28 DIAGNOSIS — I5043 Acute on chronic combined systolic (congestive) and diastolic (congestive) heart failure: Secondary | ICD-10-CM | POA: Diagnosis not present

## 2023-12-28 DIAGNOSIS — J45909 Unspecified asthma, uncomplicated: Secondary | ICD-10-CM | POA: Diagnosis not present

## 2023-12-28 DIAGNOSIS — J4489 Other specified chronic obstructive pulmonary disease: Secondary | ICD-10-CM | POA: Diagnosis present

## 2023-12-28 DIAGNOSIS — F03A18 Unspecified dementia, mild, with other behavioral disturbance: Secondary | ICD-10-CM | POA: Diagnosis present

## 2023-12-28 DIAGNOSIS — J449 Chronic obstructive pulmonary disease, unspecified: Secondary | ICD-10-CM | POA: Diagnosis not present

## 2023-12-28 DIAGNOSIS — Z79899 Other long term (current) drug therapy: Secondary | ICD-10-CM

## 2023-12-28 DIAGNOSIS — Z833 Family history of diabetes mellitus: Secondary | ICD-10-CM | POA: Diagnosis not present

## 2023-12-28 DIAGNOSIS — E785 Hyperlipidemia, unspecified: Secondary | ICD-10-CM | POA: Diagnosis present

## 2023-12-28 DIAGNOSIS — E875 Hyperkalemia: Secondary | ICD-10-CM | POA: Diagnosis not present

## 2023-12-28 DIAGNOSIS — Z803 Family history of malignant neoplasm of breast: Secondary | ICD-10-CM

## 2023-12-28 DIAGNOSIS — F411 Generalized anxiety disorder: Secondary | ICD-10-CM | POA: Diagnosis present

## 2023-12-28 DIAGNOSIS — R0600 Dyspnea, unspecified: Secondary | ICD-10-CM

## 2023-12-28 DIAGNOSIS — Z880 Allergy status to penicillin: Secondary | ICD-10-CM

## 2023-12-28 DIAGNOSIS — K5900 Constipation, unspecified: Secondary | ICD-10-CM | POA: Diagnosis not present

## 2023-12-28 DIAGNOSIS — Z90711 Acquired absence of uterus with remaining cervical stump: Secondary | ICD-10-CM

## 2023-12-28 DIAGNOSIS — I517 Cardiomegaly: Secondary | ICD-10-CM | POA: Diagnosis not present

## 2023-12-28 DIAGNOSIS — Z86718 Personal history of other venous thrombosis and embolism: Secondary | ICD-10-CM

## 2023-12-28 DIAGNOSIS — E1122 Type 2 diabetes mellitus with diabetic chronic kidney disease: Secondary | ICD-10-CM | POA: Diagnosis not present

## 2023-12-28 DIAGNOSIS — R4189 Other symptoms and signs involving cognitive functions and awareness: Secondary | ICD-10-CM | POA: Diagnosis present

## 2023-12-28 DIAGNOSIS — N186 End stage renal disease: Secondary | ICD-10-CM

## 2023-12-28 DIAGNOSIS — R062 Wheezing: Secondary | ICD-10-CM | POA: Diagnosis not present

## 2023-12-28 DIAGNOSIS — Z886 Allergy status to analgesic agent status: Secondary | ICD-10-CM | POA: Diagnosis not present

## 2023-12-28 DIAGNOSIS — D638 Anemia in other chronic diseases classified elsewhere: Secondary | ICD-10-CM | POA: Diagnosis not present

## 2023-12-28 DIAGNOSIS — S80919A Unspecified superficial injury of unspecified knee, initial encounter: Secondary | ICD-10-CM | POA: Diagnosis not present

## 2023-12-28 DIAGNOSIS — Z471 Aftercare following joint replacement surgery: Secondary | ICD-10-CM | POA: Diagnosis not present

## 2023-12-28 DIAGNOSIS — I132 Hypertensive heart and chronic kidney disease with heart failure and with stage 5 chronic kidney disease, or end stage renal disease: Secondary | ICD-10-CM | POA: Diagnosis not present

## 2023-12-28 DIAGNOSIS — Z801 Family history of malignant neoplasm of trachea, bronchus and lung: Secondary | ICD-10-CM

## 2023-12-28 DIAGNOSIS — G3184 Mild cognitive impairment, so stated: Secondary | ICD-10-CM | POA: Diagnosis present

## 2023-12-28 DIAGNOSIS — I120 Hypertensive chronic kidney disease with stage 5 chronic kidney disease or end stage renal disease: Secondary | ICD-10-CM | POA: Diagnosis not present

## 2023-12-28 DIAGNOSIS — S0993XA Unspecified injury of face, initial encounter: Secondary | ICD-10-CM | POA: Diagnosis not present

## 2023-12-28 DIAGNOSIS — I5033 Acute on chronic diastolic (congestive) heart failure: Secondary | ICD-10-CM | POA: Diagnosis present

## 2023-12-28 DIAGNOSIS — R0602 Shortness of breath: Secondary | ICD-10-CM | POA: Diagnosis not present

## 2023-12-28 DIAGNOSIS — Z7189 Other specified counseling: Secondary | ICD-10-CM | POA: Diagnosis not present

## 2023-12-28 DIAGNOSIS — E8779 Other fluid overload: Principal | ICD-10-CM

## 2023-12-28 DIAGNOSIS — Z91013 Allergy to seafood: Secondary | ICD-10-CM

## 2023-12-28 LAB — I-STAT VENOUS BLOOD GAS, ED
Acid-base deficit: 4 mmol/L — ABNORMAL HIGH (ref 0.0–2.0)
Bicarbonate: 21.5 mmol/L (ref 20.0–28.0)
Calcium, Ion: 1.14 mmol/L — ABNORMAL LOW (ref 1.15–1.40)
HCT: 26 % — ABNORMAL LOW (ref 36.0–46.0)
Hemoglobin: 8.8 g/dL — ABNORMAL LOW (ref 12.0–15.0)
O2 Saturation: 100 %
Potassium: 6.3 mmol/L (ref 3.5–5.1)
Sodium: 140 mmol/L (ref 135–145)
TCO2: 23 mmol/L (ref 22–32)
pCO2, Ven: 40.4 mmHg — ABNORMAL LOW (ref 44–60)
pH, Ven: 7.334 (ref 7.25–7.43)
pO2, Ven: 187 mmHg — ABNORMAL HIGH (ref 32–45)

## 2023-12-28 LAB — CBC WITH DIFFERENTIAL/PLATELET
Abs Immature Granulocytes: 0.06 K/uL (ref 0.00–0.07)
Basophils Absolute: 0 K/uL (ref 0.0–0.1)
Basophils Relative: 0 %
Eosinophils Absolute: 0.2 K/uL (ref 0.0–0.5)
Eosinophils Relative: 2 %
HCT: 28.3 % — ABNORMAL LOW (ref 36.0–46.0)
Hemoglobin: 8.7 g/dL — ABNORMAL LOW (ref 12.0–15.0)
Immature Granulocytes: 1 %
Lymphocytes Relative: 10 %
Lymphs Abs: 0.8 K/uL (ref 0.7–4.0)
MCH: 30.5 pg (ref 26.0–34.0)
MCHC: 30.7 g/dL (ref 30.0–36.0)
MCV: 99.3 fL (ref 80.0–100.0)
Monocytes Absolute: 0.7 K/uL (ref 0.1–1.0)
Monocytes Relative: 9 %
Neutro Abs: 5.7 K/uL (ref 1.7–7.7)
Neutrophils Relative %: 78 %
Platelets: 246 K/uL (ref 150–400)
RBC: 2.85 MIL/uL — ABNORMAL LOW (ref 3.87–5.11)
RDW: 14.8 % (ref 11.5–15.5)
WBC: 7.4 K/uL (ref 4.0–10.5)
nRBC: 0 % (ref 0.0–0.2)

## 2023-12-28 LAB — COMPREHENSIVE METABOLIC PANEL WITH GFR
ALT: <5 U/L (ref 0–44)
AST: 18 U/L (ref 15–41)
Albumin: 2.1 g/dL — ABNORMAL LOW (ref 3.5–5.0)
Alkaline Phosphatase: 35 U/L — ABNORMAL LOW (ref 38–126)
Anion gap: 8 (ref 5–15)
BUN: 81 mg/dL — ABNORMAL HIGH (ref 8–23)
CO2: 18 mmol/L — ABNORMAL LOW (ref 22–32)
Calcium: 7.9 mg/dL — ABNORMAL LOW (ref 8.9–10.3)
Chloride: 112 mmol/L — ABNORMAL HIGH (ref 98–111)
Creatinine, Ser: 6.07 mg/dL — ABNORMAL HIGH (ref 0.44–1.00)
GFR, Estimated: 6 mL/min — ABNORMAL LOW (ref >60)
Glucose, Bld: 119 mg/dL — ABNORMAL HIGH (ref 70–99)
Potassium: 5.6 mmol/L — ABNORMAL HIGH (ref 3.5–5.1)
Sodium: 138 mmol/L (ref 135–145)
Total Bilirubin: 0.5 mg/dL (ref 0.0–1.2)
Total Protein: 5.3 g/dL — ABNORMAL LOW (ref 6.5–8.1)

## 2023-12-28 LAB — BRAIN NATRIURETIC PEPTIDE: B Natriuretic Peptide: 1010.7 pg/mL — ABNORMAL HIGH (ref 0.0–100.0)

## 2023-12-28 LAB — TROPONIN I (HIGH SENSITIVITY)
Troponin I (High Sensitivity): 109 ng/L (ref <18)
Troponin I (High Sensitivity): 97 ng/L — ABNORMAL HIGH (ref <18)

## 2023-12-28 LAB — MAGNESIUM: Magnesium: 2.2 mg/dL (ref 1.7–2.4)

## 2023-12-28 MED ORDER — SEVELAMER CARBONATE 800 MG PO TABS
800.0000 mg | ORAL_TABLET | Freq: Three times a day (TID) | ORAL | Status: DC
  Administered 2023-12-28 – 2024-01-05 (×20): 800 mg via ORAL
  Filled 2023-12-28 (×20): qty 1

## 2023-12-28 MED ORDER — MONTELUKAST SODIUM 10 MG PO TABS
10.0000 mg | ORAL_TABLET | Freq: Every day | ORAL | Status: DC
  Administered 2023-12-29 – 2024-01-04 (×7): 10 mg via ORAL
  Filled 2023-12-28 (×7): qty 1

## 2023-12-28 MED ORDER — SODIUM CHLORIDE 0.9% FLUSH
3.0000 mL | Freq: Two times a day (BID) | INTRAVENOUS | Status: DC
  Administered 2023-12-28 – 2024-01-05 (×17): 3 mL via INTRAVENOUS

## 2023-12-28 MED ORDER — HEPARIN SODIUM (PORCINE) 5000 UNIT/ML IJ SOLN
5000.0000 [IU] | Freq: Three times a day (TID) | INTRAMUSCULAR | Status: DC
  Administered 2023-12-28 – 2024-01-05 (×24): 5000 [IU] via SUBCUTANEOUS
  Filled 2023-12-28 (×24): qty 1

## 2023-12-28 MED ORDER — POLYVINYL ALCOHOL 1.4 % OP SOLN: 2.0000 [drp] | OPHTHALMIC | Status: DC | PRN

## 2023-12-28 MED ORDER — ALBUTEROL SULFATE (2.5 MG/3ML) 0.083% IN NEBU
2.5000 mg | INHALATION_SOLUTION | Freq: Four times a day (QID) | RESPIRATORY_TRACT | Status: DC | PRN
  Administered 2023-12-29: 2.5 mg via RESPIRATORY_TRACT
  Filled 2023-12-28: qty 3

## 2023-12-28 MED ORDER — SODIUM BICARBONATE 650 MG PO TABS
650.0000 mg | ORAL_TABLET | Freq: Two times a day (BID) | ORAL | Status: DC
Start: 2023-12-28 — End: 2024-01-04
  Administered 2023-12-28 – 2024-01-04 (×14): 650 mg via ORAL
  Filled 2023-12-28 (×14): qty 1

## 2023-12-28 MED ORDER — NEPRO/CARBSTEADY PO LIQD
237.0000 mL | Freq: Two times a day (BID) | ORAL | Status: DC
  Administered 2023-12-28 – 2024-01-04 (×12): 237 mL via ORAL

## 2023-12-28 MED ORDER — FUROSEMIDE 10 MG/ML IJ SOLN
80.0000 mg | Freq: Two times a day (BID) | INTRAMUSCULAR | Status: DC
  Administered 2023-12-29 (×2): 80 mg via INTRAVENOUS
  Filled 2023-12-28 (×2): qty 8

## 2023-12-28 MED ORDER — CITALOPRAM HYDROBROMIDE 10 MG PO TABS
10.0000 mg | ORAL_TABLET | Freq: Every day | ORAL | Status: DC
  Administered 2023-12-29 – 2024-01-05 (×8): 10 mg via ORAL
  Filled 2023-12-28 (×8): qty 1

## 2023-12-28 MED ORDER — CLONIDINE HCL 0.2 MG PO TABS
0.3000 mg | ORAL_TABLET | Freq: Once | ORAL | Status: AC
  Administered 2023-12-28: 0.3 mg via ORAL
  Filled 2023-12-28: qty 1

## 2023-12-28 MED ORDER — LORAZEPAM 0.5 MG PO TABS
0.5000 mg | ORAL_TABLET | Freq: Two times a day (BID) | ORAL | Status: DC | PRN
Start: 2023-12-28 — End: 2024-01-04
  Administered 2023-12-29 – 2023-12-30 (×2): 0.5 mg via ORAL
  Filled 2023-12-28 (×2): qty 1

## 2023-12-28 MED ORDER — ACETAMINOPHEN 325 MG PO TABS
650.0000 mg | ORAL_TABLET | Freq: Four times a day (QID) | ORAL | Status: DC | PRN
  Administered 2023-12-28 – 2024-01-03 (×6): 650 mg via ORAL
  Filled 2023-12-28 (×6): qty 2

## 2023-12-28 MED ORDER — FUROSEMIDE 10 MG/ML IJ SOLN
80.0000 mg | Freq: Once | INTRAMUSCULAR | Status: AC
  Administered 2023-12-28: 80 mg via INTRAVENOUS
  Filled 2023-12-28: qty 8

## 2023-12-28 MED ORDER — HYDRALAZINE HCL 50 MG PO TABS
100.0000 mg | ORAL_TABLET | Freq: Three times a day (TID) | ORAL | Status: DC
  Administered 2023-12-28 – 2024-01-05 (×23): 100 mg via ORAL
  Filled 2023-12-28 (×23): qty 2

## 2023-12-28 MED ORDER — BUDESON-GLYCOPYRROL-FORMOTEROL 160-9-4.8 MCG/ACT IN AERO
2.0000 | INHALATION_SPRAY | Freq: Two times a day (BID) | RESPIRATORY_TRACT | Status: DC
  Administered 2023-12-28 – 2024-01-05 (×16): 2 via RESPIRATORY_TRACT
  Filled 2023-12-28: qty 5.9

## 2023-12-28 MED ORDER — AMLODIPINE BESYLATE 10 MG PO TABS
10.0000 mg | ORAL_TABLET | Freq: Every day | ORAL | Status: DC
Start: 2023-12-29 — End: 2024-01-05
  Administered 2023-12-29 – 2024-01-05 (×8): 10 mg via ORAL
  Filled 2023-12-28 (×8): qty 1

## 2023-12-28 MED ORDER — ACETAMINOPHEN 650 MG RE SUPP: 650.0000 mg | Freq: Four times a day (QID) | RECTAL | Status: DC | PRN

## 2023-12-28 MED ORDER — POLYETHYLENE GLYCOL 3350 17 G PO PACK: 17.0000 g | PACK | Freq: Every day | ORAL | Status: DC | PRN

## 2023-12-28 MED ORDER — CARVEDILOL 12.5 MG PO TABS
12.5000 mg | ORAL_TABLET | Freq: Two times a day (BID) | ORAL | Status: DC
  Administered 2023-12-28 – 2024-01-05 (×16): 12.5 mg via ORAL
  Filled 2023-12-28 (×16): qty 1

## 2023-12-28 NOTE — Consult Note (Signed)
 Palliative Care Consult Note                                  Date: 12/28/2023   Patient Name: [name]  DOB: 18-Feb-1934  MRN: [id number]  Age / Sex: 89 y.o., female  PCP: Charlett Apolinar POUR, MD Referring Physician: [name], MD  Reason for Consultation: Establishing goals of care  HPI/Patient Profile: 89 y.o. female  with past medical history of CKD stage V, hypertension, asthma, anemia of chronic disease, chronic combined systolic and diastolic CHF, anxiety, GERD, hypertension, and hyperlipidemia admitted on 12/28/2023 with shortness of breath and a fall.   Patient oxygen saturation was 87% for EMS and improved to 90s after a DuoNeb.  Patient has had 5 hospitalizations and 1 ED visit in the last 6 months. Patient and family are known to PMT service. PMT has been consulted to assist with goals of care conversation.   Past Medical History:  Diagnosis Date   Acute exacerbation of moderate persistent extrinsic asthma 08/08/2023   Acute kidney injury superimposed on stage 5 chronic kidney disease, not on chronic dialysis (HCC) 08/08/2023   Acute superficial venous thrombosis of left lower extremity 03/27/2014   concern about hx and potential extensive on exam tenderness calf and low dose lovenox  40 qd 2-4 weekselevetion and close  fu advised .    Allergy    Anemia    Anxiety    Arthritis    shoulders (09/09/2012)   Asthma 09/08/2012   Bereavement due to life event 04/21/2013   Anniversary daughter's death, brother new diagnosis of cancer     CAP (community acquired pneumonia) 10/31/2022   Carotid bruit    us  2018  low risk 1 - 39%   Cataract    bil cateracts removed   CKD (chronic kidney disease) stage 5, GFR less than 15 ml/min (HCC)    as of may 2025, creat 4.5- 5.5 range   Clotting disorder (HCC)    blood clots in legs   Colon cancer screening 2013/02/19   Death of family member 31-Aug-2013   Diabetes mellitus  without complication (HCC)    Borderline per pt; being monitored.  no meds per pt   Diverticulosis    Dyspnea 04/27/2014   Elevated uric acid in blood 04/13/2014   Fluid overload 12/02/2023   GERD (gastroesophageal reflux disease)    Headache(784.0)    related to my high blood pressure (09/09/2012)   Hiatal hernia    History of DVT (deep vein thrombosis)    RLE (09/09/2012) coumadine cant take asa so on plavix     HTN (hypertension) 09/08/2012   Hyperlipidemia    Hypothermia 10/15/2023   Lupus    cured years ago (09/09/2012)   Other dysphagia 02/19/13   Palpitations 01/05/2014   Sepsis (HCC) 10/15/2023   Syncope and collapse 01/21/2018   Varicose veins    Varicose veins of leg with complications 12/05/2010    Subjective:   I have reviewed medical records including EPIC notes, labs and imaging, received update from attending provider, assessed the patient and then met with the patient's granddaughter and her aunt to discuss diagnosis prognosis, GOC, EOL wishes, disposition and options.  I introduced Palliative Medicine as specialized medical care for people living with serious illness. It focuses on providing relief from symptoms and stress of a serious illness. The goal is to improve quality of life for both the  patient and the family.  Today's Discussion: Patient is sleeping.  Met with patient's decision maker/granddaughter Harland and her aunt at the bedside.  They patient and her family are familiar with the palliative medicine team.  During her previous hospitalizations PMT has spoken extensively with patient and family.  Patient was last discharged at the end of July with the plan that outpatient palliative care would follow her.  That follow-up had not yet occurred.  During her time since her last hospitalization the patient has been compliant with her medications. She has been able to do the things she enjoys like going to church and going out with family. She was able  to complete most ADLs independently. She was using a walker. Her appetite is not great but when she finds foods she likes she can eat a complete meal. She had some bad days but a lot of good days as well. As long as the patient continues having good days and wants to continue aggressive measures the family wants to support her in her decisions. The family believes that when the patient is tired of rehospitalization and aggressive measures she will tell them so-- at that time they will transition to comfort measures. They are well informed on home hospice services.  A discussion was had today regarding advanced directives. During the last admission there was an attempt to complete HCPOA documents. Family would like to complete these during this admission-- spiritual care consulted for assistance. Concepts specific to code status, artifical feeding and hydration, continued IV antibiotics and rehospitalization was had.  We discussed the difference between an aggressive medical intervention path and a comfort focused path. MOST form completed today. The family outlined their wishes for the following treatment decisions:  Cardiopulmonary Resuscitation: Do Not Attempt Resuscitation (DNR/No CPR)  Medical Interventions: Limited Additional Interventions: Use medical treatment, IV fluids and cardiac monitoring as indicated, DO NOT USE intubation or mechanical ventilation. May consider use of less invasive airway support such as BiPAP or CPAP. Also provide comfort measures. Transfer to the hospital if indicated. Avoid intensive care.   Antibiotics: Determine use of limitation of antibiotics when infection occurs  IV Fluids: IV fluids if indicated  Feeding Tube: No feeding tube   Discussed the importance of continued conversation with family and the medical providers regarding overall plan of care and treatment options, ensuring decisions are within the context of the patient's values and GOCs.  Questions and  concerns were addressed. Hard Choices booklet left for review. The family was encouraged to call with questions or concerns. PMT will continue to support holistically.  Review of Systems  Unable to perform ROS   Objective:   Primary Diagnoses: Present on Admission:  HTN (hypertension)  Hyperlipidemia  GERD (gastroesophageal reflux disease)  CKD (chronic kidney disease) stage 5, GFR less than 15 ml/min (HCC)  Asthma, chronic  Anxiety state  (Resolved) Anemia, chronic disease  Anemia of chronic disease  Acute on chronic combined systolic (congestive) and diastolic (congestive) heart failure (HCC)   Physical Exam Vitals reviewed.  Constitutional:      General: She is sleeping. She is not in acute distress. Cardiovascular:     Rate and Rhythm: Normal rate.  Pulmonary:     Effort: Pulmonary effort is normal.  Skin:    General: Skin is warm and dry.     Vital Signs:  BP (!) 132/52   Pulse 61   Temp 97.7 F (36.5 C) (Tympanic)   Resp 16   SpO2 100%  Advanced Care Planning:   Existing Vynca/ACP Documentation: DNR  Primary Decision Maker: PATIENT. When patient is unable to make decisions her granddaughter Lorenzo Pereyra is proxy Management consultant.  Code Status/Advance Care Planning: DNR   Assessment & Plan:   SUMMARY OF RECOMMENDATIONS   Continue DNR/DNI Continue treating the treatable Family has good understanding of options moving forward  MOST form completed Spiritual care consult for HCPOA document creation Outpatient palliative ordered at last visit but patient has not been seen yet- will let [facility] know she is hospitalized Continued PMT support   Discussed with: Dr. Seena  Time Total: 75 minutes    Thank you for allowing us  to participate in the care of [facility] PMT will continue to support holistically.    Signed by: Stephane Palin, NP Palliative Medicine Team  Team Phone # [phone] (Nights/Weekends)  12/28/2023, 5:48 PM

## 2023-12-28 NOTE — ED Triage Notes (Addendum)
 Pt arrives via EMS from Home. Pt reports ongoing sob since yesterday.  Hx of COPD and CHF. Pt also fell this morning while going to the bathroom, states she was dizzy at the time. Pt states she did hit her head, swelling noted around right eye, sclera is red.  denies Loc, no blood thinners. She reports pain to bilateral knees. She is AxOx4.   Pt was initially 87% on RA with EMS, Ems administered 1 duoneb.   Pt is 96% on RA during triage. Labored work of breathing.

## 2023-12-28 NOTE — ED Notes (Signed)
 Pt to floor with transport. Floor notified.

## 2023-12-28 NOTE — ED Notes (Signed)
 Patient given warm blankets.

## 2023-12-28 NOTE — ED Notes (Signed)
 Light green top redraw sent down to lab.

## 2023-12-28 NOTE — H&P (Signed)
 History and Physical   [name] FMW:[id number] DOB: 1933/07/25 DOA: 12/28/2023  PCP: Charlett Apolinar POUR, [name]   Patient coming from: Home  Chief Complaint: Shortness of breath, fall  HPI: [name] is a 89 y.o. female with medical history significant of hypertension, hyperlipidemia, GERD, CKD 5, chronic combined systolic and diastolic CHF, mild dementia, anxiety, anemia, asthma, DVT presenting with shortness of breath and fall.  History obtained with assistance of family and chart review.  Patient has had increased shortness of breath for the past 1 to 2 days.  Did have a fall this morning reports feeling dizzy and then fell while using the bathroom.  She did hit her head.  EMS was called and patient noted to be initially saturating around 87% and this improved to the 90s after a DuoNeb.  Recently admitted with significant hypertension, volume overload due to medication nonadherence in the setting of CHF and hypertension.  She was noted to be CKD 5 at that time with creatinine stable in the 5-6 range.  Nephrology was involved and patient was deemed not a good candidate for dialysis and opted that she would not like to pursue dialysis either.    Patient was seen by palliative medicine when she was here.  Patient and family agreed that being DNR was most appropriate for the patient.  They were opening to continuing with outpatient palliative care as well as further conversations about hospice but were not yet ready to pursue hospice at that time.  Discharged with home health and outpatient palliative medicine plans.  See note on 7/27 by [name].  Patient has been taking her medications since discharge and has been able to go out a few times last week.  Family reports patient has also had some orthopnea and difficulty sleeping last day or 2.  Patient did not participate significantly in review of systems due to her tiredness and cognitive impairment/early dementia.  ED Course:  Vital signs in the ED notable for blood pressure in the 160s-180s systolic.  Respiratory in the 20s.  Saturating well on room air though was initially hypoxic for EMS as above.  Lab workup included CBC with hemoglobin stable at 8.7.  Troponin 109 but this is near baseline.  BNP elevated to 1010 though this is near where her level has been recently.  Blood cultures pending.  CMP is pending as sample had hemolyzed.  Chest x-ray showed patchy airspace opacities concerning for multifocal pneumonia.  CT head showed no acute intracranial abnormality but did show a left parietal scalp hematoma.  CT max facial study was negative. Patient received dose of clonidine  in the ED.  Review of Systems: Patient did not participate significantly in review of systems due to her tiredness and cognitive impairment/early dementia.  Past Medical History:  Diagnosis Date   Acute exacerbation of moderate persistent extrinsic asthma 08/08/2023   Acute kidney injury superimposed on stage 5 chronic kidney disease, not on chronic dialysis (HCC) 08/08/2023   Acute superficial venous thrombosis of left lower extremity 03/27/2014   concern about hx and potential extensive on exam tenderness calf and low dose lovenox  40 qd 2-4 weekselevetion and close  fu advised .    Allergy    Anemia    Anxiety    Arthritis    shoulders (09/09/2012)   Asthma 09/08/2012   Bereavement due to life event 04/21/2013   Anniversary daughter's death, brother new diagnosis of cancer     CAP (community acquired pneumonia) 10/31/2022  Carotid bruit    us  2018  low risk 1 - 39%   Cataract    bil cateracts removed   CKD (chronic kidney disease) stage 5, GFR less than 15 ml/min (HCC)    as of may 2025, creat 4.5- 5.5 range   Clotting disorder (HCC)    blood clots in legs   Colon cancer screening 2013-03-06   Death of family member 09-15-2013   Diabetes mellitus without complication (HCC)    Borderline per pt; being monitored.  no meds  per pt   Diverticulosis    Dyspnea 04/27/2014   Elevated uric acid in blood 04/13/2014   Fluid overload 12/02/2023   GERD (gastroesophageal reflux disease)    Headache(784.0)    related to my high blood pressure (09/09/2012)   Hiatal hernia    History of DVT (deep vein thrombosis)    RLE (09/09/2012) coumadine cant take asa so on plavix     HTN (hypertension) 09/08/2012   Hyperlipidemia    Hypothermia 10/15/2023   Lupus    cured years ago (09/09/2012)   Other dysphagia Mar 06, 2013   Palpitations 01/05/2014   Sepsis (HCC) 10/15/2023   Syncope and collapse 01/21/2018   Varicose veins    Varicose veins of leg with complications 12/05/2010    Past Surgical History:  Procedure Laterality Date   ABDOMINAL HYSTERECTOMY     partial   BREAST EXCISIONAL BIOPSY Right    BREAST SURGERY     CARPAL TUNNEL RELEASE Right 2000's   CARPAL TUNNEL RELEASE Bilateral 10/21/2013   Procedure: BILATERAL  CARPAL TUNNEL RELEASE;  Surgeon: Angela JONELLE Curia, [name];  Location: [facility];  Service: Orthopedics;  Laterality: Bilateral;   COLONOSCOPY     SHOULDER OPEN ROTATOR CUFF REPAIR Bilateral 2000's   TRIGGER FINGER RELEASE Right 10/21/2013   Procedure: RELEASE TRIGGER FINGER/A-1 PULLEY RIGHT MIDDLE AND RIGHT RING;  Surgeon: Angela JONELLE Curia, [name];  Location: [facility];  Service: Orthopedics;  Laterality: Right;   UPPER GASTROINTESTINAL ENDOSCOPY      Social History  reports that she has never smoked. She has never used smokeless tobacco. She reports that she does not drink alcohol  and does not use drugs.  Allergies  Allergen Reactions   Penicillins Hives   Asa [Aspirin] Other (See Comments)    Wheezing Acetaminophen  is OK    Fish Allergy Itching, Nausea And Vomiting and Swelling    Swelling of the face    Family History  Problem Relation Age of Onset   Hypertension Mother    Lung cancer Mother    Hypertension Father    Lung cancer Father    Breast cancer Sister     Colon cancer Sister        ? 24' s dx - died in 71's   Breast cancer Sister    Hypertension Brother    Rectal cancer Brother    Stomach cancer Brother    Breast cancer Brother    Heart attack Daughter    Esophageal cancer Daughter    Hypertension Daughter    Diabetes Daughter    Breast cancer Paternal Aunt    Lung cancer Other        both parents    Pancreatic cancer Neg Hx   Reviewed on admission  Prior to Admission medications   Medication Sig Start Date End Date Taking? Authorizing Provider  acetaminophen  (TYLENOL ) 325 MG tablet Take 2 tablets (650 mg total) by mouth every 6 (six) hours as needed for  mild pain (pain score 1-3) (or Fever >/= 101). Patient not taking: Reported on 12/12/2023 09/26/23   Regalado, Owen A, [name]  albuterol  (PROVENTIL ) (2.5 MG/3ML) 0.083% nebulizer solution Take 3 mLs (2.5 mg total) by nebulization every 6 (six) hours as needed for shortness of breath or wheezing. Patient not taking: Reported on 12/12/2023 08/03/22   Enedelia Dorna HERO, FNP  albuterol  (VENTOLIN  HFA) 108 (90 Base) MCG/ACT inhaler USE 2 PUFFS EVERY 6 HOURS AS NEEDED FOR WHEEZING. Patient not taking: Reported on 12/12/2023 11/26/23   Byrum, Robert S, [name]  amLODipine  (NORVASC ) 10 MG tablet Take 1 tablet (10 mg total) by mouth daily. Patient not taking: Reported on 12/12/2023 11/07/22   Krishnan, Gokul, [name]  Artificial Tear Solution (GENTEAL TEARS) 0.1-0.2-0.3 % SOLN Place 2 drops into both eyes as needed. Patient not taking: Reported on 12/12/2023 10/24/23   Sherrill Cable Latif, DO  Budeson-Glycopyrrol-Formoterol  (BREZTRI  AEROSPHERE) 160-9-4.8 MCG/ACT AERO INHALE 2 PUFFS INTO THE LUNGS TWICE DAILY, IN THE MORNING & AT BEDTIME. Patient not taking: Reported on 12/12/2023 04/19/23   Shelah Lamar RAMAN, [name]  carvedilol  (COREG ) 12.5 MG tablet Take 1 tablet (12.5 mg total) by mouth 2 (two) times daily. Patient not taking: Reported on 12/12/2023 04/06/23   Nahser, Aleene PARAS, [name]  citalopram  (CELEXA ) 20 MG tablet  Take 0.5 tablets (10 mg total) by mouth daily. Patient not taking: Reported on 12/12/2023 08/07/23   Panosh, Wanda K, [name]  Darbepoetin Alfa  (ARANESP ) 60 MCG/0.3ML SOSY injection Inject 0.3 mLs (60 mcg total) into the skin every Tuesday at 6 PM. Patient not taking: Reported on 12/12/2023 12/11/23   Elpidio Reyes DEL, [name]  diphenhydramine -calamine (CALAMINE PLUS) 1-8 % LOTN On lesion  bid as needed Patient not taking: Reported on 12/12/2023 11/08/23   Dennise Kingsley, [name]  doxazosin  (CARDURA ) 2 MG tablet Take 0.5 tablets (1 mg total) by mouth at bedtime. Patient not taking: Reported on 12/12/2023 12/10/23   Elpidio Reyes DEL, [name]  ferrous sulfate  325 (65 FE) MG EC tablet Take 1 tablet (325 mg total) by mouth daily with breakfast. Patient not taking: Reported on 12/12/2023 12/15/14   Panosh, Wanda K, [name]  fluticasone  (FLONASE ) 50 MCG/ACT nasal spray Place 2 sprays into both nostrils as needed for allergies or rhinitis. Patient not taking: Reported on 12/12/2023    Provider, Historical, [name]  hydrALAZINE  (APRESOLINE ) 100 MG tablet Take 1 tablet (100 mg total) by mouth 3 (three) times daily. Patient not taking: Reported on 12/12/2023 05/21/23   Nahser, Aleene PARAS, [name]  lidocaine  (LIDODERM ) 5 % Place 1 patch onto the skin daily. Remove & Discard patch within 12 hours or as directed by [name] Patient not taking: Reported on 12/12/2023 09/27/23   Regalado, Owen A, [name]  LORazepam  (ATIVAN ) 0.5 MG tablet Take 1 tablet (0.5 mg total) by mouth 2 (two) times daily as needed. for anxiety 10/24/23   Sherrill Cable Latif, DO  mirtazapine  (REMERON ) 7.5 MG tablet Take 1 tablet (7.5 mg total) by mouth at bedtime. Patient not taking: Reported on 12/12/2023 12/10/23   Elpidio Reyes DEL, [name]  montelukast  (SINGULAIR ) 10 MG tablet Take 1 tablet (10 mg total) by mouth daily. Patient not taking: Reported on 12/12/2023 04/19/23   Shelah Lamar RAMAN, [name]  Nutritional Supplements (FEEDING SUPPLEMENT, NEPRO CARB STEADY,) LIQD Take 237 mLs by mouth 2 (two) times daily  between meals. Patient not taking: Reported on 12/12/2023 10/24/23   Sherrill Cable Latif, DO  sevelamer  carbonate (RENVELA ) 800 MG tablet Take 1 tablet (800 mg total)  by mouth 3 (three) times daily with meals. Patient not taking: Reported on 12/12/2023 12/10/23   Elpidio Reyes DEL, [name]  sodium bicarbonate  650 MG tablet Take 1 tablet (650 mg total) by mouth 2 (two) times daily. Patient not taking: Reported on 12/12/2023 09/26/23   Regalado, Belkys A, [name]  Spacer/Aero-Holding Chambers (AEROCHAMBER PLUS) inhaler Use as instructed Patient not taking: Reported on 12/12/2023 05/26/16   Van Knee, [name]  torsemide  (DEMADEX ) 20 MG tablet Take 2 tablets (40 mg total) by mouth daily. Patient not taking: Reported on 12/12/2023 12/11/23   Elpidio Reyes DEL, [name]  vitamin B-12 (CYANOCOBALAMIN ) 1000 MCG tablet Take 1,000 mcg by mouth daily. Patient not taking: Reported on 12/12/2023    Provider, Historical, [name]  Vitamin D , Cholecalciferol , 25 MCG (1000 UT) TABS Take 1,000 Units by mouth daily. Patient not taking: Reported on 12/12/2023 05/30/23   Douglass Kenney NOVAK, FNP    Physical Exam: Vitals:   12/28/23 0915 12/28/23 0930 12/28/23 0945 12/28/23 0947  BP: (!) 186/72 (!) 172/68 (!) 165/68 (!) 165/68  Pulse: 96 91 91   Resp: (!) 31 (!) 28 (!) 23   Temp: 98.5 F (36.9 C)     SpO2: 96% 95% 94%     Physical Exam Constitutional:      General: She is not in acute distress.    Appearance: Normal appearance.  HENT:     Head: Normocephalic and atraumatic.     Mouth/Throat:     Mouth: Mucous membranes are moist.     Pharynx: Oropharynx is clear.  Eyes:     Extraocular Movements: Extraocular movements intact.     Pupils: Pupils are equal, round, and reactive to light.  Cardiovascular:     Rate and Rhythm: Normal rate and regular rhythm.     Pulses: Normal pulses.     Heart sounds: Normal heart sounds.  Pulmonary:     Effort: Pulmonary effort is normal. No respiratory distress.     Breath sounds: Rales present.   Abdominal:     General: Bowel sounds are normal. There is no distension.     Palpations: Abdomen is soft.     Tenderness: There is no abdominal tenderness.  Musculoskeletal:        General: No swelling or deformity.     Right lower leg: Edema present.     Left lower leg: Edema present.  Skin:    General: Skin is warm and dry.  Neurological:     General: No focal deficit present.     Mental Status: Mental status is at baseline.    Labs on Admission: I have personally reviewed following labs and imaging studies  CBC: Recent Labs  Lab 12/28/23 0939 12/28/23 1028  WBC 7.4  --   NEUTROABS 5.7  --   HGB 8.7* 8.8*  HCT 28.3* 26.0*  MCV 99.3  --   PLT 246  --     Basic Metabolic Panel: Recent Labs  Lab 12/28/23 1028  NA 140  K 6.3*    GFR: CrCl cannot be calculated (Unknown ideal weight.).  Liver Function Tests: No results for input(s): AST, ALT, ALKPHOS, BILITOT, PROT, ALBUMIN  in the last 168 hours.  Urine analysis:    Component Value Date/Time   COLORURINE YELLOW 10/14/2023 1720   APPEARANCEUR CLEAR 10/14/2023 1720   LABSPEC 1.013 10/14/2023 1720   PHURINE 6.0 10/14/2023 1720   GLUCOSEU 50 (A) 10/14/2023 1720   GLUCOSEU NEGATIVE 01/16/2023 1238   HGBUR NEGATIVE 10/14/2023 1720  BILIRUBINUR NEGATIVE 10/14/2023 1720   KETONESUR NEGATIVE 10/14/2023 1720   PROTEINUR >=300 (A) 10/14/2023 1720   UROBILINOGEN 0.2 01/16/2023 1238   NITRITE NEGATIVE 10/14/2023 1720   LEUKOCYTESUR NEGATIVE 10/14/2023 1720    Radiological Exams on Admission: CT Maxillofacial Wo Contrast Result Date: 12/28/2023 EXAM: CT Face without contrast 12/28/2023 10:09:20 AM TECHNIQUE: CT of the face was performed without the administration of intravenous contrast. Multiplanar reformatted images are provided for review. Automated exposure control, iterative reconstruction, and/or weight based adjustment of the mA/kV was utilized to reduce the radiation dose to as low as reasonably  achievable. COMPARISON: None available CLINICAL HISTORY: Facial trauma, blunt. FINDINGS: AERODIGESTIVE TRACT: No mass. No edema. SALIVARY GLANDS: No acute abnormality. LYMPH NODES: No suspicious cervical lymphadenopathy. SOFT TISSUES: No mass or fluid collection. BRAIN, ORBITS AND SINUSES: No acute abnormality. Patient is status post bilateral lens replacement. BONES: No acute abnormality. No suspicious bone lesion. The patient is edentulous. LIMITATIONS/ARTIFACTS: There is mild motion artifact. IMPRESSION: 1. No acute abnormality of the face related to blunt facial trauma. Electronically signed by: [name] 12/28/2023 10:48 AM EDT RP Workstation: [id number]   CT Head Wo Contrast Result Date: 12/28/2023 EXAM: CT HEAD WITHOUT CONTRAST 12/28/2023 10:09:20 AM TECHNIQUE: CT of the head was performed without the administration of intravenous contrast. Automated exposure control, iterative reconstruction, and/or weight based adjustment of the mA/kV was utilized to reduce the radiation dose to as low as reasonably achievable. COMPARISON: CT of the head dated 10/17/2023. CLINICAL HISTORY: Head trauma, minor (Age >= 65y). FINDINGS: BRAIN AND VENTRICLES: No acute hemorrhage. Gray-white differentiation is preserved. No hydrocephalus. No extra-axial collection. No mass effect or midline shift. Age-related atrophy and mild periventricular white matter disease. ORBITS: The patient is status post bilateral lens replacement and right scleral banding. SINUSES: No acute abnormality. SOFT TISSUES AND SKULL: There is a left parietal scalp hematoma. No skull fracture. IMPRESSION: 1. No acute intracranial abnormality. 2. Left parietal scalp hematoma. Electronically signed by: [name] 12/28/2023 10:45 AM EDT RP Workstation: [id number]   DG Chest Port 1 View Result Date: 12/28/2023 CLINICAL DATA:  dyspnea ho COPD CHF EXAM: PORTABLE CHEST - 1 VIEW COMPARISON:  December 02, 2023 FINDINGS: Interval development of patchy  airspace opacities in both upper lung zones. Probable trace right pleural effusion. No pneumothorax. Moderate cardiomegaly. Tortuous aorta with aortic atherosclerosis. No acute fracture or destructive lesions. Multilevel thoracic osteophytosis. IMPRESSION: Interval development of patchy airspace opacities in both upper lung zones, worrisome for a multifocal pneumonia. Probable trace right pleural effusion. Electronically Signed   By: Angela Myers M.D.   On: 12/28/2023 10:03   EKG: Independently reviewed.  Sinus rhythm at 96 bpm.  Nonspecific T wave changes.  Evidence of LVH with repolarization.  Similar to previous.  Assessment/Plan Active Problems:   Anemia of chronic disease   CKD (chronic kidney disease) stage 5, GFR less than 15 ml/min (HCC)   Hyperlipidemia   Anxiety state   GERD (gastroesophageal reflux disease)   HTN (hypertension)   History of DVT (deep vein thrombosis)   Asthma, chronic   Mild cognitive impairment   Acute on chronic combined systolic and diastolic CHF > Last echo was in June and showed EF 45-50%, global hypokinesis, indeterminate diastolic function, normal RV function. > Presented with shortness of breath and was transiently hypoxic but this improved after receiving a DuoNeb by EMS. > X-ray read as possible multifocal pneumonia though fluid could appear similar.  On exam patient has rales and edema, more consistent  with volume overload.  Normal white count, will check viral panel. > As patient has been doing better about taking medications, likely etiology is difficulty reaching threshold volume for diuresis with progressive CKD 5.   > Lasix  dose has been ordered in the ED. - Cardiac monitoring - No reason for repeat echo as this was recently done - Lasix  80 mg IV twice daily - Strict I's and O's, daily weights - Check magnesium  - Trend renal function and electrolytes - Fluid restricted diet  Hypertension - Restart home amlodipine , Coreg , hydralazine  -  Diuresis as above  Hyperlipidemia - Not a medication for this  GERD - Not on medication for this  CKD 5 Hyperkalemia > Creatinine is mildly elevated at 6.17, this was 6.072 weeks ago and mild elevation may be in the setting of volume overload/CHF exacerbation. > Had discussions with nephrology and palliative medicine during last admission, not a candidate for dialysis and would not want dialysis. > Mildly elevated potassium at 5.6, should respond to diuresis. - Will reconsult palliative medicine while admitted. - Trend renal function and electrolytes  Anxiety - Continue home Celexa  and as needed Ativan   Anemia - Hemoglobin stable at 8.7  Asthma - Resume home Breztri  and as needed albuterol   History of DVT - Noted   DVT prophylaxis: Heparin  Code Status:   DNR/DNI Family Communication:  Updated at bedside, please reach out with any updates. Disposition Plan:   Patient is from:  Home  Anticipated DC to:  Pending clinical course  Anticipated DC date:  1 to 3 days  Anticipated DC barriers: None  Consults called:  Order for palliative medicine consult placed Admission status:  Observation, telemetry  Severity of Illness: The appropriate patient status for this patient is OBSERVATION. Observation status is judged to be reasonable and necessary in order to provide the required intensity of service to ensure the patient's safety. The patient's presenting symptoms, physical exam findings, and initial radiographic and laboratory data in the context of their medical condition is felt to place them at decreased risk for further clinical deterioration. Furthermore, it is anticipated that the patient will be medically stable for discharge from the hospital within 2 midnights of admission.    Angela KATHEE Scurry [name] [facility]  How to contact the [facility] Attending or Consulting provider 7A - 7P or covering provider during after hours 7P -7A, for this patient?   Check the care team  in [facility] and look for a) attending/consulting [facility] provider listed and b) the [facility] team listed Log into [email or web address] and use [facility]'s universal password to access. If you do not have the password, please contact the hospital operator. Locate the [facility] provider you are looking for under [facility] and page to a number that you can be directly reached. If you still have difficulty reaching the provider, please page the [facility] (Director on Call) for the Hospitalists listed on amion for assistance.  12/28/2023, 12:57 PM

## 2023-12-28 NOTE — ED Provider Notes (Signed)
 [place] [facility]MENT AT [facility] Provider Note   CSN: [id number] Arrival date & time: 12/28/23  9093     Patient presents with: Respiratory Distress   [name] is a 89 y.o. female.   HPI 89 year old female history of CHF, COPD, stage V kidney disease, reportedly not to have dialysis, presents today with increasing dyspnea.  History is limited as patient appears to have some confusion and shortness of breath.  Initial history is obtained via EMS to nurse.  They report that patient has been having increased shortness of breath over the past 1 to 2 days.  She had fallen this morning.  She reports that she passed out while going to the bathroom.  She is complaining of some pain in the right periorbital area and dyspnea.  EMS reported initial sats 87%. Review of her chart notes that she was recently hospitalized with discharge date of 12/02/2023.  That admission was for fluid overload.  They noted stage V kidney disease with improvement but creatinine at 5-6 throughout hospital is physician and not a candidate for dialysis returned to her oral diuretics per nephrology, hypertensive urgency resolved on admission they were continuing Coreg  hydralazine  amlodipine  and clonidine .  Elevated troponin thought to be from demand ischemia, anemia of CKD, moderate persistent asthma, mild dementia, home with palliative care     Prior to Admission medications   Medication Sig Start Date End Date Taking? Authorizing Provider  acetaminophen  (TYLENOL ) 325 MG tablet Take 2 tablets (650 mg total) by mouth every 6 (six) hours as needed for mild pain (pain score 1-3) (or Fever >/= 101). Patient not taking: Reported on 12/12/2023 09/26/23   Regalado, Owen A, MD  albuterol  (PROVENTIL ) (2.5 MG/3ML) 0.083% nebulizer solution Take 3 mLs (2.5 mg total) by nebulization every 6 (six) hours as needed for shortness of breath or wheezing. Patient not taking: Reported on 12/12/2023 08/03/22    Enedelia Dorna HERO, FNP  albuterol  (VENTOLIN  HFA) 108 (90 Base) MCG/ACT inhaler USE 2 PUFFS EVERY 6 HOURS AS NEEDED FOR WHEEZING. Patient not taking: Reported on 12/12/2023 11/26/23   Byrum, Robert S, MD  amLODipine  (NORVASC ) 10 MG tablet Take 1 tablet (10 mg total) by mouth daily. Patient not taking: Reported on 12/12/2023 11/07/22   Krishnan, Gokul, MD  Artificial Tear Solution (GENTEAL TEARS) 0.1-0.2-0.3 % SOLN Place 2 drops into both eyes as needed. Patient not taking: Reported on 12/12/2023 10/24/23   Sherrill Cable Latif, DO  Budeson-Glycopyrrol-Formoterol  (BREZTRI  AEROSPHERE) 160-9-4.8 MCG/ACT AERO INHALE 2 PUFFS INTO THE LUNGS TWICE DAILY, IN THE MORNING & AT BEDTIME. Patient not taking: Reported on 12/12/2023 04/19/23   Shelah Lamar RAMAN, MD  carvedilol  (COREG ) 12.5 MG tablet Take 1 tablet (12.5 mg total) by mouth 2 (two) times daily. Patient not taking: Reported on 12/12/2023 04/06/23   Nahser, Aleene PARAS, MD  citalopram  (CELEXA ) 20 MG tablet Take 0.5 tablets (10 mg total) by mouth daily. Patient not taking: Reported on 12/12/2023 08/07/23   Charlett Apolinar POUR, MD  Darbepoetin Alfa  (ARANESP ) 60 MCG/0.3ML SOSY injection Inject 0.3 mLs (60 mcg total) into the skin every Tuesday at 6 PM. Patient not taking: Reported on 12/12/2023 12/11/23   Elpidio Reyes DEL, MD  diphenhydramine -calamine (CALAMINE PLUS) 1-8 % LOTN On lesion  bid as needed Patient not taking: Reported on 12/12/2023 11/08/23   Dennise Kingsley, MD  doxazosin  (CARDURA ) 2 MG tablet Take 0.5 tablets (1 mg total) by mouth at bedtime. Patient not taking: Reported on 12/12/2023 12/10/23  Elpidio Reyes DEL, MD  ferrous sulfate  325 (65 FE) MG EC tablet Take 1 tablet (325 mg total) by mouth daily with breakfast. Patient not taking: Reported on 12/12/2023 12/15/14   Panosh, Wanda K, MD  fluticasone  (FLONASE ) 50 MCG/ACT nasal spray Place 2 sprays into both nostrils as needed for allergies or rhinitis. Patient not taking: Reported on 12/12/2023    [provider]  hydrALAZINE  (APRESOLINE ) 100 MG tablet Take 1 tablet (100 mg total) by mouth 3 (three) times daily. Patient not taking: Reported on 12/12/2023 05/21/23   Nahser, Aleene PARAS, MD  lidocaine  (LIDODERM ) 5 % Place 1 patch onto the skin daily. Remove & Discard patch within 12 hours or as directed by MD Patient not taking: Reported on 12/12/2023 09/27/23   Regalado, Belkys A, MD  LORazepam  (ATIVAN ) 0.5 MG tablet Take 1 tablet (0.5 mg total) by mouth 2 (two) times daily as needed. for anxiety 10/24/23   Sherrill Cable Latif, DO  mirtazapine  (REMERON ) 7.5 MG tablet Take 1 tablet (7.5 mg total) by mouth at bedtime. Patient not taking: Reported on 12/12/2023 12/10/23   Elpidio Reyes DEL, MD  montelukast  (SINGULAIR ) 10 MG tablet Take 1 tablet (10 mg total) by mouth daily. Patient not taking: Reported on 12/12/2023 04/19/23   Shelah Lamar RAMAN, MD  Nutritional Supplements (FEEDING SUPPLEMENT, NEPRO CARB STEADY,) LIQD Take 237 mLs by mouth 2 (two) times daily between meals. Patient not taking: Reported on 12/12/2023 10/24/23   Sherrill Cable Latif, DO  sevelamer  carbonate (RENVELA ) 800 MG tablet Take 1 tablet (800 mg total) by mouth 3 (three) times daily with meals. Patient not taking: Reported on 12/12/2023 12/10/23   Elpidio Reyes DEL, MD  sodium bicarbonate  650 MG tablet Take 1 tablet (650 mg total) by mouth 2 (two) times daily. Patient not taking: Reported on 12/12/2023 09/26/23   Regalado, Belkys A, MD  Spacer/Aero-Holding Chambers (AEROCHAMBER PLUS) inhaler Use as instructed Patient not taking: Reported on 12/12/2023 05/26/16   Van Knee, MD  torsemide  (DEMADEX ) 20 MG tablet Take 2 tablets (40 mg total) by mouth daily. Patient not taking: Reported on 12/12/2023 12/11/23   Elpidio Reyes DEL, MD  vitamin B-12 (CYANOCOBALAMIN ) 1000 MCG tablet Take 1,000 mcg by mouth daily. Patient not taking: Reported on 12/12/2023    [provider]  Vitamin D , Cholecalciferol , 25 MCG (1000 UT) TABS Take 1,000  Units by mouth daily. Patient not taking: Reported on 12/12/2023 05/30/23   Douglass Caul B, FNP    Allergies: Penicillins, Asa [aspirin], and Fish allergy    Review of Systems  Updated Vital Signs BP (!) 151/63   Pulse 69   Temp 98.5 F (36.9 C)   Resp (!) 23   SpO2 100%   Physical Exam Vitals reviewed.  Constitutional:      General: She is in acute distress.     Appearance: She is ill-appearing.  HENT:     Head: Normocephalic.     Right Ear: External ear normal.     Left Ear: External ear normal.     Nose: Nose normal.     Mouth/Throat:     Mouth: Mucous membranes are dry.     Pharynx: Oropharynx is clear.  Eyes:     Pupils: Pupils are equal, round, and reactive to light.  Cardiovascular:     Rate and Rhythm: Normal rate.     Pulses: Normal pulses.  Pulmonary:     Effort: Pulmonary effort is normal.  Musculoskeletal:     Cervical  back: Normal range of motion.     Right lower leg: Edema present.     Left lower leg: Edema present.  Skin:    Capillary Refill: Capillary refill takes less than 2 seconds.  Neurological:     General: No focal deficit present.     Mental Status: She is alert.     (all labs ordered are listed, but only abnormal results are displayed) Labs Reviewed  CBC WITH DIFFERENTIAL/PLATELET - Abnormal; Notable for the following components:      Result Value   RBC 2.85 (*)    Hemoglobin 8.7 (*)    HCT 28.3 (*)    All other components within normal limits  BRAIN NATRIURETIC PEPTIDE - Abnormal; Notable for the following components:   B Natriuretic Peptide 1,010.7 (*)    All other components within normal limits  COMPREHENSIVE METABOLIC PANEL WITH GFR - Abnormal; Notable for the following components:   Potassium 5.6 (*)    Chloride 112 (*)    CO2 18 (*)    Glucose, Bld 119 (*)    BUN 81 (*)    Creatinine, Ser 6.07 (*)    Calcium  7.9 (*)    Total Protein 5.3 (*)    Albumin  2.1 (*)    Alkaline Phosphatase 35 (*)    GFR, Estimated 6 (*)     All other components within normal limits  I-STAT VENOUS BLOOD GAS, ED - Abnormal; Notable for the following components:   pCO2, Ven 40.4 (*)    pO2, Ven 187 (*)    Acid-base deficit 4.0 (*)    Potassium 6.3 (*)    Calcium , Ion 1.14 (*)    HCT 26.0 (*)    Hemoglobin 8.8 (*)    All other components within normal limits  TROPONIN I (HIGH SENSITIVITY) - Abnormal; Notable for the following components:   Troponin I (High Sensitivity) 109 (*)    All other components within normal limits  TROPONIN I (HIGH SENSITIVITY) - Abnormal; Notable for the following components:   Troponin I (High Sensitivity) 97 (*)    All other components within normal limits  CULTURE, BLOOD (SINGLE)  RESP PANEL BY RT-PCR (RSV, FLU A&B, COVID)  RVPGX2  MAGNESIUM     EKG: EKG Interpretation Date/Time:  Friday December 28 2023 09:14:55 EDT Ventricular Rate:  96 PR Interval:  170 QRS Duration:  98 QT Interval:  365 QTC Calculation: 462 R Axis:   66  Text Interpretation: Sinus rhythm Probable anteroseptal infarct, recent Confirmed by Levander Houston [id number]) on 12/28/2023 12:25:11 PM  Radiology: CT Maxillofacial Wo Contrast Result Date: 12/28/2023 EXAM: CT Face without contrast 12/28/2023 10:09:20 AM TECHNIQUE: CT of the face was performed without the administration of intravenous contrast. Multiplanar reformatted images are provided for review. Automated exposure control, iterative reconstruction, and/or weight based adjustment of the mA/kV was utilized to reduce the radiation dose to as low as reasonably achievable. COMPARISON: None available CLINICAL HISTORY: Facial trauma, blunt. FINDINGS: AERODIGESTIVE TRACT: No mass. No edema. SALIVARY GLANDS: No acute abnormality. LYMPH NODES: No suspicious cervical lymphadenopathy. SOFT TISSUES: No mass or fluid collection. BRAIN, ORBITS AND SINUSES: No acute abnormality. Patient is status post bilateral lens replacement. BONES: No acute abnormality. No suspicious bone lesion. The  patient is edentulous. LIMITATIONS/ARTIFACTS: There is mild motion artifact. IMPRESSION: 1. No acute abnormality of the face related to blunt facial trauma. Electronically signed by: evalene coho 12/28/2023 10:48 AM EDT RP Workstation: [id number]   CT Head Wo Contrast Result Date: 12/28/2023 EXAM: CT  HEAD WITHOUT CONTRAST 12/28/2023 10:09:20 AM TECHNIQUE: CT of the head was performed without the administration of intravenous contrast. Automated exposure control, iterative reconstruction, and/or weight based adjustment of the mA/kV was utilized to reduce the radiation dose to as low as reasonably achievable. COMPARISON: CT of the head dated 10/17/2023. CLINICAL HISTORY: Head trauma, minor (Age >= 65y). FINDINGS: BRAIN AND VENTRICLES: No acute hemorrhage. Gray-white differentiation is preserved. No hydrocephalus. No extra-axial collection. No mass effect or midline shift. Age-related atrophy and mild periventricular white matter disease. ORBITS: The patient is status post bilateral lens replacement and right scleral banding. SINUSES: No acute abnormality. SOFT TISSUES AND SKULL: There is a left parietal scalp hematoma. No skull fracture. IMPRESSION: 1. No acute intracranial abnormality. 2. Left parietal scalp hematoma. Electronically signed by: evalene coho 12/28/2023 10:45 AM EDT RP Workstation: [id number]   DG Chest Port 1 View Result Date: 12/28/2023 CLINICAL DATA:  dyspnea ho COPD CHF EXAM: PORTABLE CHEST - 1 VIEW COMPARISON:  December 02, 2023 FINDINGS: Interval development of patchy airspace opacities in both upper lung zones. Probable trace right pleural effusion. No pneumothorax. Moderate cardiomegaly. Tortuous aorta with aortic atherosclerosis. No acute fracture or destructive lesions. Multilevel thoracic osteophytosis. IMPRESSION: Interval development of patchy airspace opacities in both upper lung zones, worrisome for a multifocal pneumonia. Probable trace right pleural effusion. Electronically  Signed   By: Rogelia Myers M.D.   On: 12/28/2023 10:03     .Critical Care  Performed by: Levander Houston, MD Authorized by: Levander Houston, MD   Critical care provider statement:    Critical care time (minutes):  60   Critical care time was exclusive of:  Separately billable procedures and treating other patients and teaching time   Critical care was necessary to treat or prevent imminent or life-threatening deterioration of the following conditions:  Respiratory failure   Critical care was time spent personally by me on the following activities:  Development of treatment plan with patient or surrogate, discussions with consultants, evaluation of patient's response to treatment, examination of patient, ordering and review of laboratory studies, ordering and review of radiographic studies, ordering and performing treatments and interventions, pulse oximetry, re-evaluation of patient's condition and review of old charts    Medications Ordered in the ED  furosemide  (LASIX ) injection 80 mg (has no administration in time range)  amLODipine  (NORVASC ) tablet 10 mg (has no administration in time range)  carvedilol  (COREG ) tablet 12.5 mg (has no administration in time range)  hydrALAZINE  (APRESOLINE ) tablet 100 mg (has no administration in time range)  citalopram  (CELEXA ) tablet 10 mg (has no administration in time range)  LORazepam  (ATIVAN ) tablet 0.5 mg (has no administration in time range)  sevelamer  carbonate (RENVELA ) tablet 800 mg (has no administration in time range)  sodium bicarbonate  tablet 650 mg (has no administration in time range)  feeding supplement (NEPRO CARB STEADY) liquid 237 mL (has no administration in time range)  budesonide -glycopyrrolate -formoterol  (BREZTRI ) 160-9-4.8 MCG/ACT inhaler 2 puff (has no administration in time range)  albuterol  (PROVENTIL ) (2.5 MG/3ML) 0.083% nebulizer solution 2.5 mg (has no administration in time range)  montelukast  (SINGULAIR ) tablet 10 mg (has  no administration in time range)  GenTeal Tears 0.1-0.2-0.3 % 2 drop (has no administration in time range)  heparin  injection 5,000 Units (has no administration in time range)  furosemide  (LASIX ) injection 80 mg (has no administration in time range)  sodium chloride  flush (NS) 0.9 % injection 3 mL (has no administration in time range)  acetaminophen  (TYLENOL ) tablet 650 mg (  has no administration in time range)    Or  acetaminophen  (TYLENOL ) suppository 650 mg (has no administration in time range)  polyethylene glycol (MIRALAX  / GLYCOLAX ) packet 17 g (has no administration in time range)  cloNIDine  (CATAPRES ) tablet 0.3 mg (0.3 mg Oral Given 12/28/23 0947)    Clinical Course as of 12/28/23 1345  Fri Dec 28, 2023  1150 Chest x-Niah Heinle reviewed and interpreted with interval development of patchy airspace opacities in upper lungs worrisome for multifocal pneumonia with trace right effusion.  Per my interpretation, based on clinical history, suspicion for CHF. [DR]  1225 Hemoglobin 8.7 slightly decreased from first prior of 9 [DR]  1225 Troponin elevated at 109 [DR]  1226 VBG potassium 6.3 [DR]  1226 Blood pressure decreased to 165/68 [DR]  1227 BNP elevated at 1010 [DR]    Clinical Course User Index [DR] Levander Houston, MD                                 Medical Decision Making Amount and/or Complexity of Data Reviewed Labs: ordered. Radiology: ordered.  Risk Decision regarding hospitalization.   Patient presents with dyspnea.  Respiratory rate is elevated at 31.  Here sats are 96%.  Reported 87% prehospital. On my exam she has some crackles in the right base and diffuse coarse breath sounds Plan evaluation with chest x-Caedan Sumler, EKG, labs including CBC be met and troponin Patient with elevated blood pressure here at 186/72.  She has a known history of CHF we will attempt to control her blood pressure to improve her respiratory status. Daughter and granddaughter who are POA are en route to  hospital Patient presents today with respiratory distress and hypoxia prehospital. Dyspnea likely secondary to volume overload versus infection.  BNP is elevated diffuse increased markings upper lobe likely secondary to volume overload.  Patient receiving IV Lasix  here. Patient with known stage V kidney disease.  There have previously been questions of compliance with medication.  Creatinine today is 6 which is increased from baseline of 5. Patient with elevated potassium at 5.6.  No acute EKG changes.  Lasix  given. CBC with hemoglobin 8.7 appears stable from prior. Elevated BNP at 1000, troponin elevated at 97. Patient has reportedly previously had palliative care evaluation and hospice.  However, family appears somewhat unaware of this.  I have discussed end-of-life care and DNR. Discussed above with Dr. Seena who has assumed care of patient and is at bedside.    Final diagnoses:  Other hypervolemia  Dyspnea, unspecified type  ESRD (end stage renal disease) [facility])    ED Discharge Orders     None          Levander Houston, MD 12/28/23 1345

## 2023-12-29 DIAGNOSIS — R4189 Other symptoms and signs involving cognitive functions and awareness: Secondary | ICD-10-CM | POA: Diagnosis present

## 2023-12-29 DIAGNOSIS — J45909 Unspecified asthma, uncomplicated: Secondary | ICD-10-CM | POA: Diagnosis not present

## 2023-12-29 DIAGNOSIS — Z8249 Family history of ischemic heart disease and other diseases of the circulatory system: Secondary | ICD-10-CM | POA: Diagnosis not present

## 2023-12-29 DIAGNOSIS — G3184 Mild cognitive impairment, so stated: Secondary | ICD-10-CM | POA: Diagnosis not present

## 2023-12-29 DIAGNOSIS — Z79899 Other long term (current) drug therapy: Secondary | ICD-10-CM | POA: Diagnosis not present

## 2023-12-29 DIAGNOSIS — I5033 Acute on chronic diastolic (congestive) heart failure: Secondary | ICD-10-CM | POA: Diagnosis not present

## 2023-12-29 DIAGNOSIS — F411 Generalized anxiety disorder: Secondary | ICD-10-CM | POA: Diagnosis present

## 2023-12-29 DIAGNOSIS — Z91148 Patient's other noncompliance with medication regimen for other reason: Secondary | ICD-10-CM | POA: Diagnosis not present

## 2023-12-29 DIAGNOSIS — Z886 Allergy status to analgesic agent status: Secondary | ICD-10-CM | POA: Diagnosis not present

## 2023-12-29 DIAGNOSIS — S0003XA Contusion of scalp, initial encounter: Secondary | ICD-10-CM | POA: Diagnosis present

## 2023-12-29 DIAGNOSIS — Z86718 Personal history of other venous thrombosis and embolism: Secondary | ICD-10-CM | POA: Diagnosis not present

## 2023-12-29 DIAGNOSIS — I5043 Acute on chronic combined systolic (congestive) and diastolic (congestive) heart failure: Secondary | ICD-10-CM | POA: Diagnosis not present

## 2023-12-29 DIAGNOSIS — D631 Anemia in chronic kidney disease: Secondary | ICD-10-CM | POA: Diagnosis present

## 2023-12-29 DIAGNOSIS — E875 Hyperkalemia: Secondary | ICD-10-CM | POA: Diagnosis present

## 2023-12-29 DIAGNOSIS — Z66 Do not resuscitate: Secondary | ICD-10-CM | POA: Diagnosis present

## 2023-12-29 DIAGNOSIS — Z515 Encounter for palliative care: Secondary | ICD-10-CM | POA: Diagnosis not present

## 2023-12-29 DIAGNOSIS — W19XXXA Unspecified fall, initial encounter: Secondary | ICD-10-CM | POA: Diagnosis present

## 2023-12-29 DIAGNOSIS — J4489 Other specified chronic obstructive pulmonary disease: Secondary | ICD-10-CM | POA: Diagnosis present

## 2023-12-29 DIAGNOSIS — F03A18 Unspecified dementia, mild, with other behavioral disturbance: Secondary | ICD-10-CM | POA: Diagnosis present

## 2023-12-29 DIAGNOSIS — E785 Hyperlipidemia, unspecified: Secondary | ICD-10-CM | POA: Diagnosis present

## 2023-12-29 DIAGNOSIS — K219 Gastro-esophageal reflux disease without esophagitis: Secondary | ICD-10-CM | POA: Diagnosis present

## 2023-12-29 DIAGNOSIS — E1122 Type 2 diabetes mellitus with diabetic chronic kidney disease: Secondary | ICD-10-CM | POA: Diagnosis present

## 2023-12-29 DIAGNOSIS — Z7189 Other specified counseling: Secondary | ICD-10-CM | POA: Diagnosis not present

## 2023-12-29 DIAGNOSIS — Z833 Family history of diabetes mellitus: Secondary | ICD-10-CM | POA: Diagnosis not present

## 2023-12-29 DIAGNOSIS — I10 Essential (primary) hypertension: Secondary | ICD-10-CM | POA: Diagnosis not present

## 2023-12-29 DIAGNOSIS — Z1152 Encounter for screening for COVID-19: Secondary | ICD-10-CM | POA: Diagnosis not present

## 2023-12-29 DIAGNOSIS — N185 Chronic kidney disease, stage 5: Secondary | ICD-10-CM | POA: Diagnosis not present

## 2023-12-29 DIAGNOSIS — F03A4 Unspecified dementia, mild, with anxiety: Secondary | ICD-10-CM | POA: Diagnosis present

## 2023-12-29 DIAGNOSIS — Z90711 Acquired absence of uterus with remaining cervical stump: Secondary | ICD-10-CM | POA: Diagnosis not present

## 2023-12-29 DIAGNOSIS — I132 Hypertensive heart and chronic kidney disease with heart failure and with stage 5 chronic kidney disease, or end stage renal disease: Secondary | ICD-10-CM | POA: Diagnosis present

## 2023-12-29 LAB — CBC
HCT: 23.9 % — ABNORMAL LOW (ref 36.0–46.0)
Hemoglobin: 7.2 g/dL — ABNORMAL LOW (ref 12.0–15.0)
MCH: 30.1 pg (ref 26.0–34.0)
MCHC: 30.1 g/dL (ref 30.0–36.0)
MCV: 100 fL (ref 80.0–100.0)
Platelets: 216 K/uL (ref 150–400)
RBC: 2.39 MIL/uL — ABNORMAL LOW (ref 3.87–5.11)
RDW: 14.6 % (ref 11.5–15.5)
WBC: 4.1 K/uL (ref 4.0–10.5)
nRBC: 0 % (ref 0.0–0.2)

## 2023-12-29 LAB — COMPREHENSIVE METABOLIC PANEL WITH GFR
ALT: 6 U/L (ref 0–44)
AST: 13 U/L — ABNORMAL LOW (ref 15–41)
Albumin: 2.1 g/dL — ABNORMAL LOW (ref 3.5–5.0)
Alkaline Phosphatase: 35 U/L — ABNORMAL LOW (ref 38–126)
Anion gap: 9 (ref 5–15)
BUN: 81 mg/dL — ABNORMAL HIGH (ref 8–23)
CO2: 19 mmol/L — ABNORMAL LOW (ref 22–32)
Calcium: 8.2 mg/dL — ABNORMAL LOW (ref 8.9–10.3)
Chloride: 111 mmol/L (ref 98–111)
Creatinine, Ser: 6.25 mg/dL — ABNORMAL HIGH (ref 0.44–1.00)
GFR, Estimated: 6 mL/min — ABNORMAL LOW (ref >60)
Glucose, Bld: 136 mg/dL — ABNORMAL HIGH (ref 70–99)
Potassium: 5.3 mmol/L — ABNORMAL HIGH (ref 3.5–5.1)
Sodium: 139 mmol/L (ref 135–145)
Total Bilirubin: 0.5 mg/dL (ref 0.0–1.2)
Total Protein: 5.3 g/dL — ABNORMAL LOW (ref 6.5–8.1)

## 2023-12-29 LAB — RESP PANEL BY RT-PCR (RSV, FLU A&B, COVID)  RVPGX2
Influenza A by PCR: NEGATIVE
Influenza B by PCR: NEGATIVE
Resp Syncytial Virus by PCR: NEGATIVE
SARS Coronavirus 2 by RT PCR: NEGATIVE

## 2023-12-29 MED ORDER — SODIUM ZIRCONIUM CYCLOSILICATE 10 G PO PACK
10.0000 g | PACK | Freq: Once | ORAL | Status: AC
  Administered 2023-12-29: 10 g via ORAL
  Filled 2023-12-29: qty 1

## 2023-12-29 MED ORDER — DARBEPOETIN ALFA 150 MCG/0.3ML IJ SOSY
150.0000 ug | PREFILLED_SYRINGE | Freq: Once | INTRAMUSCULAR | Status: AC
  Administered 2023-12-29: 150 ug via SUBCUTANEOUS
  Filled 2023-12-29 (×2): qty 0.3

## 2023-12-29 NOTE — Progress Notes (Signed)
 8/16 LILLETTE Jon Cruel verbally reviewed observation notice with Harland Chuck, granddaughter telephonically @ [phone].

## 2023-12-29 NOTE — Progress Notes (Signed)
  [phone] Civil engineer, contracting?Hospital Liaison Note:??   ???  Notified by Marval SINNING manager of patient/family request for [facility] Palliative services at home after discharge.????????   ????  Hospital liaison will follow patient for discharge disposition.?   ????  Please call with any hospice or outpatient palliative care related questions.???   ????  Thank you for the opportunity to participate in this patient's care.   Nat Babe, BSN, RN [employer] [phone]

## 2023-12-29 NOTE — Progress Notes (Addendum)
 PROGRESS NOTE    [name]  FMW:[id number] DOB: 09-Oct-1933 DOA: 12/28/2023 PCP: Charlett Apolinar POUR, MD   89 y.o. female  with past medical history of CKD stage V, hypertension, asthma, anemia of chronic disease, chronic combined systolic and diastolic CHF, anxiety, GERD, hypertension, and hyperlipidemia admitted on 12/28/2023 with shortness of breath and a fall.  Increased dyspnea last 1 to 2 days, recently discharged after hospitalization for same, CKD 5 followed by nephrology and palliative care, not a dialysis candidate .hypoxic in the ED, placed on O2 patient has had 5 hospitalizations and 1 ED visit in the last 6 months. Patient and family are known to PMT service. PMT has been consulted to assist with goals of care conversation.    Subjective: -Feels fair, no events overnight, reports a fall yesterday, mild dyspnea  Assessment and Plan:  Acute on chronic combined systolic and diastolic CHF - Recent echo 6/25 EF 45-50%, global hypokinesis, indeterminate diastolic function, normal RV function. -Continue Lasix  80 mg twice daily today, urine output has been inaccurate -Seen by nephrology few weeks ago in the hospital, not felt to be a dialysis candidate, frequent hospitalizations in the setting of volume overload from CKD 5 and CHF -Hospice has been recommended on numerous occasions, palliative care following, -DMT limited by CKD 5   Hypertension -Stable continue amlodipine , Coreg , hydralazine  - Diuresis as above   GERD - Not on medication for this   CKD 5 Hyperkalemia -Creatinine relatively stable at baseline, she is not a dialysis candidate, potassium was 6.3 yesterday, now improving will repeat Lokelma  today -See discussion above, not a dialysis candidate, this has been clarified by nephrology few weeks ago -Continue palliative care discussions   Anxiety - Continue home Celexa  and as needed Ativan    Anemia of chronic disease - Hemoglobin trend down to 7.2 today, recent  anemia panel in July suggestive of chronic disease, added dose of EPO X1   Asthma - Resume home Breztri  and as needed albuterol    History of DVT - Noted   DVT prophylaxis: Heparin  subcutaneous Code Status: DNR Family Communication: No family at bedside will update daughter Disposition Plan:   Consultants:    Procedures:   Antimicrobials:    Objective: Vitals:   12/29/23 0053 12/29/23 0427 12/29/23 0740 12/29/23 0923  BP: (!) 140/60 136/60 (!) 150/63   Pulse: 69 68 71   Resp: (!) 22 17 (!) 23   Temp: 97.7 F (36.5 C) 97.7 F (36.5 C) 98 F (36.7 C)   TempSrc: Oral Oral Oral   SpO2: 100% 98% 100% 100%  Weight:  63.3 kg      Intake/Output Summary (Last 24 hours) at 12/29/2023 1033 Last data filed at 12/29/2023 0900 Gross per 24 hour  Intake 243 ml  Output --  Net 243 ml   Filed Weights   12/29/23 0427  Weight: 63.3 kg    Examination:  General exam: Appears calm and comfortable, cognitive deficits HEENT, right eye subconjunctival hemorrhage Respiratory system: Decreased breath sounds at the bases Cardiovascular system: S1 & S2 heard, RRR.  Abd: nondistended, soft and nontender.Normal bowel sounds heard. Central nervous system: Alert and oriented. No focal neurological deficits. Extremities: 1+ edema Skin: No rashes Psychiatry:  Mood & affect appropriate.     Data Reviewed:   CBC: Recent Labs  Lab 12/28/23 0939 12/28/23 1028 12/29/23 0229  WBC 7.4  --  4.1  NEUTROABS 5.7  --   --   HGB 8.7* 8.8* 7.2*  HCT 28.3*  26.0* 23.9*  MCV 99.3  --  100.0  PLT 246  --  216   Basic Metabolic Panel: Recent Labs  Lab 12/28/23 1028 12/28/23 1135 12/29/23 0229  NA 140 138 139  K 6.3* 5.6* 5.3*  CL  --  112* 111  CO2  --  18* 19*  GLUCOSE  --  119* 136*  BUN  --  81* 81*  CREATININE  --  6.07* 6.25*  CALCIUM   --  7.9* 8.2*  MG  --  2.2  --    GFR: Estimated Creatinine Clearance: 5.7 mL/min (A) (by C-G formula based on SCr of 6.25 mg/dL (H)). Liver  Function Tests: Recent Labs  Lab 12/28/23 1135 12/29/23 0229  AST 18 13*  ALT <5 6  ALKPHOS 35* 35*  BILITOT 0.5 0.5  PROT 5.3* 5.3*  ALBUMIN  2.1* 2.1*   No results for input(s): LIPASE, AMYLASE in the last 168 hours. No results for input(s): AMMONIA in the last 168 hours. Coagulation Profile: No results for input(s): INR, PROTIME in the last 168 hours. Cardiac Enzymes: No results for input(s): CKTOTAL, CKMB, CKMBINDEX, TROPONINI in the last 168 hours. BNP (last 3 results) Recent Labs    09/22/23 0036  PROBNP >35,000.0*   HbA1C: No results for input(s): HGBA1C in the last 72 hours. CBG: No results for input(s): GLUCAP in the last 168 hours. Lipid Profile: No results for input(s): CHOL, HDL, LDLCALC, TRIG, CHOLHDL, LDLDIRECT in the last 72 hours. Thyroid  Function Tests: No results for input(s): TSH, T4TOTAL, FREET4, T3FREE, THYROIDAB in the last 72 hours. Anemia Panel: No results for input(s): VITAMINB12, FOLATE, FERRITIN, TIBC, IRON, RETICCTPCT in the last 72 hours. Urine analysis:    Component Value Date/Time   COLORURINE YELLOW 10/14/2023 1720   APPEARANCEUR CLEAR 10/14/2023 1720   LABSPEC 1.013 10/14/2023 1720   PHURINE 6.0 10/14/2023 1720   GLUCOSEU 50 (A) 10/14/2023 1720   GLUCOSEU NEGATIVE 01/16/2023 1238   HGBUR NEGATIVE 10/14/2023 1720   BILIRUBINUR NEGATIVE 10/14/2023 1720   KETONESUR NEGATIVE 10/14/2023 1720   PROTEINUR >=300 (A) 10/14/2023 1720   UROBILINOGEN 0.2 01/16/2023 1238   NITRITE NEGATIVE 10/14/2023 1720   LEUKOCYTESUR NEGATIVE 10/14/2023 1720   Sepsis Labs: @LABRCNTIP (procalcitonin:4,lacticidven:4)  ) Recent Results (from the past 240 hours)  Culture, blood (single)     Status: None (Preliminary result)   Collection Time: 12/28/23  9:29 AM   Specimen: BLOOD  Result Value Ref Range Status   Specimen Description BLOOD BLOOD RIGHT HAND  Final   Special Requests   Final    BOTTLES  DRAWN AEROBIC AND ANAEROBIC Blood Culture results may not be optimal due to an inadequate volume of blood received in culture bottles   Culture   Final    NO GROWTH < 24 HOURS Performed at [facility], [address] [place], [place] [address]    Report Status PENDING  Incomplete  Resp panel by RT-PCR (RSV, Flu A&B, Covid) Anterior Nasal Swab     Status: None   Collection Time: 12/28/23  1:12 PM   Specimen: Anterior Nasal Swab  Result Value Ref Range Status   SARS Coronavirus 2 by RT PCR NEGATIVE NEGATIVE Final   Influenza A by PCR NEGATIVE NEGATIVE Final   Influenza B by PCR NEGATIVE NEGATIVE Final    Comment: (NOTE) The Xpert Xpress SARS-CoV-2/FLU/RSV plus assay is intended as an aid in the diagnosis of influenza from Nasopharyngeal swab specimens and should not be used as a sole basis for  treatment. Nasal washings and aspirates are unacceptable for Xpert Xpress SARS-CoV-2/FLU/RSV testing.  Fact Sheet for Patients: BloggerCourse.com  Fact Sheet for Healthcare Providers: SeriousBroker.it  This test is not yet approved or cleared by the [place]  FDA and has been authorized for detection and/or diagnosis of SARS-CoV-2 by FDA under an Emergency Use Authorization (EUA). This EUA will remain in effect (meaning this test can be used) for the duration of the COVID-19 declaration under Section 564(b)(1) of the Act, 21 U.S.C. section 360bbb-3(b)(1), unless the authorization is terminated or revoked.     Resp Syncytial Virus by PCR NEGATIVE NEGATIVE Final    Comment: (NOTE) Fact Sheet for Patients: BloggerCourse.com  Fact Sheet for Healthcare Providers: SeriousBroker.it  This test is not yet approved or cleared by the [place]  FDA and has been authorized for detection and/or diagnosis of SARS-CoV-2 by FDA under an Emergency Use Authorization (EUA). This EUA will  remain in effect (meaning this test can be used) for the duration of the COVID-19 declaration under Section 564(b)(1) of the Act, 21 U.S.C. section 360bbb-3(b)(1), unless the authorization is terminated or revoked.  Performed at [facility], [address], [place], [place] [address]      Radiology Studies: CT Maxillofacial Wo Contrast Result Date: 12/28/2023 EXAM: CT Face without contrast 12/28/2023 10:09:20 AM TECHNIQUE: CT of the face was performed without the administration of intravenous contrast. Multiplanar reformatted images are provided for review. Automated exposure control, iterative reconstruction, and/or weight based adjustment of the mA/kV was utilized to reduce the radiation dose to as low as reasonably achievable. COMPARISON: None available CLINICAL HISTORY: Facial trauma, blunt. FINDINGS: AERODIGESTIVE TRACT: No mass. No edema. SALIVARY GLANDS: No acute abnormality. LYMPH NODES: No suspicious cervical lymphadenopathy. SOFT TISSUES: No mass or fluid collection. BRAIN, ORBITS AND SINUSES: No acute abnormality. Patient is status post bilateral lens replacement. BONES: No acute abnormality. No suspicious bone lesion. The patient is edentulous. LIMITATIONS/ARTIFACTS: There is mild motion artifact. IMPRESSION: 1. No acute abnormality of the face related to blunt facial trauma. Electronically signed by: evalene coho 12/28/2023 10:48 AM EDT RP Workstation: [id number]   CT Head Wo Contrast Result Date: 12/28/2023 EXAM: CT HEAD WITHOUT CONTRAST 12/28/2023 10:09:20 AM TECHNIQUE: CT of the head was performed without the administration of intravenous contrast. Automated exposure control, iterative reconstruction, and/or weight based adjustment of the mA/kV was utilized to reduce the radiation dose to as low as reasonably achievable. COMPARISON: CT of the head dated 10/17/2023. CLINICAL HISTORY: Head trauma, minor (Age >= 65y). FINDINGS: BRAIN AND VENTRICLES: No acute hemorrhage.  Gray-white differentiation is preserved. No hydrocephalus. No extra-axial collection. No mass effect or midline shift. Age-related atrophy and mild periventricular white matter disease. ORBITS: The patient is status post bilateral lens replacement and right scleral banding. SINUSES: No acute abnormality. SOFT TISSUES AND SKULL: There is a left parietal scalp hematoma. No skull fracture. IMPRESSION: 1. No acute intracranial abnormality. 2. Left parietal scalp hematoma. Electronically signed by: evalene coho 12/28/2023 10:45 AM EDT RP Workstation: [id number]   DG Chest Port 1 View Result Date: 12/28/2023 CLINICAL DATA:  dyspnea ho COPD CHF EXAM: PORTABLE CHEST - 1 VIEW COMPARISON:  December 02, 2023 FINDINGS: Interval development of patchy airspace opacities in both upper lung zones. Probable trace right pleural effusion. No pneumothorax. Moderate cardiomegaly. Tortuous aorta with aortic atherosclerosis. No acute fracture or destructive lesions. Multilevel thoracic osteophytosis. IMPRESSION: Interval development of patchy airspace opacities in both upper lung zones, worrisome for a multifocal pneumonia. Probable trace right  pleural effusion. Electronically Signed   By: Rogelia Myers M.D.   On: 12/28/2023 10:03     Scheduled Meds:  amLODipine   10 mg Oral Daily   budesonide -glycopyrrolate -formoterol   2 puff Inhalation BID   carvedilol   12.5 mg Oral BID   citalopram   10 mg Oral Daily   feeding supplement (NEPRO CARB STEADY)  237 mL Oral BID BM   furosemide   80 mg Intravenous BID   heparin   5,000 Units Subcutaneous Q8H   hydrALAZINE   100 mg Oral TID   montelukast   10 mg Oral QHS   sevelamer  carbonate  800 mg Oral TID WC   sodium bicarbonate   650 mg Oral BID   sodium chloride  flush  3 mL Intravenous Q12H   Continuous Infusions:   LOS: 0 days    Time spent:    Sigurd Pac, MD [facility]   12/29/2023, 10:33 AM

## 2023-12-29 NOTE — Care Management Obs Status (Signed)
 MEDICARE OBSERVATION STATUS NOTIFICATION   Patient Details  Name: [name] MRN: [id number] Date of Birth: [birth year]   Medicare Observation Status Notification Given:  Yes    Jon Cruel 12/29/2023, 1:44 PM

## 2023-12-29 NOTE — Progress Notes (Signed)
 Daily Progress Note   Patient Name: [name]       Date: 12/29/2023 DOB: [birth year]  Age: 89 y.o. MRN#: [id number] Attending Physician: Angela Frames, MD Primary Care Physician: Angela Apolinar POUR, MD Admit Date: 12/28/2023  Reason for Consultation/Follow-up: Establishing goals of care  Length of Stay: 0  Current Medications: Scheduled Meds:   amLODipine   10 mg Oral Daily   budesonide -glycopyrrolate -formoterol   2 puff Inhalation BID   carvedilol   12.5 mg Oral BID   citalopram   10 mg Oral Daily   feeding supplement (NEPRO CARB STEADY)  237 mL Oral BID BM   furosemide   80 mg Intravenous BID   heparin   5,000 Units Subcutaneous Q8H   hydrALAZINE   100 mg Oral TID   montelukast   10 mg Oral QHS   sevelamer  carbonate  800 mg Oral TID WC   sodium bicarbonate   650 mg Oral BID   sodium chloride  flush  3 mL Intravenous Q12H    Continuous Infusions:   PRN Meds: acetaminophen  **OR** acetaminophen , albuterol , artificial tears, LORazepam , polyethylene glycol  Physical Exam Vitals reviewed.  Constitutional:      General: She is not in acute distress. Eyes:     Conjunctiva/sclera:     Right eye: Hemorrhage present.  Cardiovascular:     Rate and Rhythm: Normal rate.  Pulmonary:     Effort: Pulmonary effort is normal.  Skin:    General: Skin is warm and dry.  Neurological:     Mental Status: She is alert. Mental status is at baseline.  Psychiatric:        Mood and Affect: Mood normal.        Behavior: Behavior normal.             Vital Signs: BP (!) 150/63 (BP Location: Right Arm)   Pulse 71   Temp 98 F (36.7 C) (Oral)   Resp (!) 23   Wt 63.3 kg   SpO2 100%   BMI 24.72 kg/m  SpO2: SpO2: 100 % O2 Device: O2 Device: Room Air O2 Flow Rate: O2 Flow Rate (L/min): 2  L/min    Patient Active Problem List   Diagnosis Date Noted   Mild cognitive impairment 12/28/2023   Acute on chronic combined systolic (congestive) and diastolic (congestive) heart failure (HCC) 12/28/2023   Adverse effect  of valaciclovir 10/14/2023   Hypertensive nephrosclerosis 08/08/2023   Chronic diastolic CHF (congestive heart failure) (HCC) 08/08/2023   Hypomagnesemia 10/31/2022   CKD (chronic kidney disease) stage 5, GFR less than 15 ml/min (HCC) 10/31/2022   Pulmonary hypertension, unspecified (HCC) 12/06/2021   Chronic cough 10/18/2021   Chronic rhinitis 11/24/2019   Hyponatremia 11/03/2018   Renal cyst 03/27/2016   CKD (chronic kidney disease) stage 3, GFR 30-59 ml/min (HCC) 08/23/2015   Resistant hypertension 06/29/2014   Lumbago 06/15/2014   Pre-diabetes vs early DM  06/15/2014   Asthma, chronic 04/13/2014   History of DVT (deep vein thrombosis)    Anemia of chronic disease 12/19/2013   Leg edema 06/20/2013   Other dysphagia 02/11/2013   Anxiety state 01/20/2013   Leg cramps 01/20/2013   Back pain 12/05/2012   Family hx of colon cancer 10/21/2012   Family hx of lung cancer 10/21/2012   Family hx-breast malignancy 10/21/2012   GERD (gastroesophageal reflux disease) 09/08/2012   HTN (hypertension) 09/08/2012   Hyperlipidemia 09/08/2012   Varicose veins of leg with complications 12/05/2010    Palliative Care Assessment & Plan   Patient Profile: 89 y.o. female  with past medical history of CKD stage V, hypertension, asthma, anemia of chronic disease, chronic combined systolic and diastolic CHF, anxiety, GERD, hypertension, and hyperlipidemia admitted on 12/28/2023 with shortness of breath and a fall.    Patient oxygen saturation was 87% for EMS and improved to 90s after a DuoNeb.   Patient has had 5 hospitalizations and 1 ED visit in the last 6 months. Patient and family are known to PMT service. PMT has been consulted to assist with goals of care conversation.    Today's Discussion: Patient sitting up in bed in no apparent distress. She is alert and oriented. She is forgetful and repeats herself but this is her baseline. No family at bedside.  Patient shared that since her last hospitalization she had been doing really well until her fall yesterday. She was able to participate in the things she enjoys and spent time with family. Discussed the support her family provides her. We discussed that she will likely continue to have these exacerbations and rehospitalizations. She is okay with this. She shared Buford is her proxy decision-maker and would like to have HCPOA document created. Emotional support and therapeutic listening provided.  Spoke to patient's granddaughter [name] by phone. Shared my discussion with her grandmother.  Discussed plan to create HCPOA documentation. Discussed plan to follow-up with outpatient palliative.   Encouraged [name] to call PMT with questions or concerns. PMT will follow.  Recommendations/Plan: Continue DNR/DNI Continue treating the treatable Family has good understanding of options moving forward  MOST form completed Spiritual care consult for HCPOA document creation Outpatient palliative ordered at last visit but patient has not been seen yet- [facility] is aware Continued PMT support   Code Status:    Code Status Orders  (From admission, onward)           Start     Ordered   12/28/23 1313  Do not attempt resuscitation (DNR)- Limited -Do Not Intubate (DNI)  Continuous       Question Answer Comment  If pulseless and not breathing No CPR or chest compressions.   In Pre-Arrest Conditions (Patient Is Breathing and Has A Pulse) Do not intubate. Provide all appropriate non-invasive medical interventions. Avoid ICU transfer unless indicated or required.   Consent: Discussion documented in EHR or advanced directives reviewed  12/28/23 1312         Extensive chart review has been completed prior to seeing  the patient including labs, vital signs, imaging, progress/consult notes, orders, medications, and available advance directive documents.  Care plan was discussed with bedside RN and Dr. Fairy  Time spent: 35 minutes  Thank you for allowing the Palliative Medicine Team to assist in the care of this patient.     Angela CHRISTELLA Palin, NP  Please contact Palliative Medicine Team phone at [phone] for questions and concerns.

## 2023-12-30 DIAGNOSIS — Z515 Encounter for palliative care: Secondary | ICD-10-CM | POA: Diagnosis not present

## 2023-12-30 DIAGNOSIS — Z7189 Other specified counseling: Secondary | ICD-10-CM | POA: Diagnosis not present

## 2023-12-30 DIAGNOSIS — I5043 Acute on chronic combined systolic (congestive) and diastolic (congestive) heart failure: Secondary | ICD-10-CM | POA: Diagnosis not present

## 2023-12-30 LAB — BASIC METABOLIC PANEL WITH GFR
Anion gap: 10 (ref 5–15)
BUN: 77 mg/dL — ABNORMAL HIGH (ref 8–23)
CO2: 18 mmol/L — ABNORMAL LOW (ref 22–32)
Calcium: 8.2 mg/dL — ABNORMAL LOW (ref 8.9–10.3)
Chloride: 111 mmol/L (ref 98–111)
Creatinine, Ser: 6.33 mg/dL — ABNORMAL HIGH (ref 0.44–1.00)
GFR, Estimated: 6 mL/min — ABNORMAL LOW (ref >60)
Glucose, Bld: 112 mg/dL — ABNORMAL HIGH (ref 70–99)
Potassium: 5.1 mmol/L (ref 3.5–5.1)
Sodium: 139 mmol/L (ref 135–145)

## 2023-12-30 LAB — CBC
HCT: 26.3 % — ABNORMAL LOW (ref 36.0–46.0)
Hemoglobin: 8 g/dL — ABNORMAL LOW (ref 12.0–15.0)
MCH: 30.3 pg (ref 26.0–34.0)
MCHC: 30.4 g/dL (ref 30.0–36.0)
MCV: 99.6 fL (ref 80.0–100.0)
Platelets: 264 K/uL (ref 150–400)
RBC: 2.64 MIL/uL — ABNORMAL LOW (ref 3.87–5.11)
RDW: 14.5 % (ref 11.5–15.5)
WBC: 5.4 K/uL (ref 4.0–10.5)
nRBC: 0 % (ref 0.0–0.2)

## 2023-12-30 LAB — GLUCOSE, CAPILLARY: Glucose-Capillary: 137 mg/dL — ABNORMAL HIGH (ref 70–99)

## 2023-12-30 MED ORDER — FUROSEMIDE 10 MG/ML IJ SOLN
120.0000 mg | Freq: Two times a day (BID) | INTRAVENOUS | Status: DC
  Administered 2023-12-30 – 2024-01-03 (×9): 120 mg via INTRAVENOUS
  Filled 2023-12-30 (×2): qty 10
  Filled 2023-12-30: qty 12
  Filled 2023-12-30 (×2): qty 120
  Filled 2023-12-30: qty 2
  Filled 2023-12-30: qty 12
  Filled 2023-12-30: qty 10
  Filled 2023-12-30: qty 120
  Filled 2023-12-30: qty 10
  Filled 2023-12-30: qty 2

## 2023-12-30 NOTE — Plan of Care (Signed)
   Problem: Safety: Goal: Ability to remain free from injury will improve Outcome: Progressing

## 2023-12-30 NOTE — Progress Notes (Signed)
 Daily Progress Note   Patient Name: [name]       Date: 12/30/2023 DOB: 09-03-1933  Age: 89 y.o. MRN#: [id number] Attending Physician: [name], MD Primary Care Physician: Charlett Apolinar POUR, MD Admit Date: 12/28/2023  Reason for Consultation/Follow-up: Establishing goals of care  Length of Stay: 1  Current Medications: Scheduled Meds:   amLODipine   10 mg Oral Daily   budesonide -glycopyrrolate -formoterol   2 puff Inhalation BID   carvedilol   12.5 mg Oral BID   citalopram   10 mg Oral Daily   feeding supplement (NEPRO CARB STEADY)  237 mL Oral BID BM   heparin   5,000 Units Subcutaneous Q8H   hydrALAZINE   100 mg Oral TID   montelukast   10 mg Oral QHS   sevelamer  carbonate  800 mg Oral TID WC   sodium bicarbonate   650 mg Oral BID   sodium chloride  flush  3 mL Intravenous Q12H    Continuous Infusions:  furosemide       PRN Meds: acetaminophen  **OR** acetaminophen , albuterol , artificial tears, LORazepam , polyethylene glycol  Physical Exam Vitals reviewed.  Constitutional:      General: She is sleeping. She is not in acute distress. Eyes:     Conjunctiva/sclera:     Right eye: Hemorrhage present.  Cardiovascular:     Rate and Rhythm: Normal rate.  Pulmonary:     Effort: Pulmonary effort is normal.  Skin:    General: Skin is warm and dry.  Neurological:     Mental Status: She is easily aroused. Mental status is at baseline.  Psychiatric:        Mood and Affect: Mood normal.        Behavior: Behavior normal.             Vital Signs: BP (!) 159/70 (BP Location: Right Arm)   Pulse 70   Temp 98.4 F (36.9 C) (Oral)   Resp 17   Wt 65.1 kg   SpO2 98%   BMI 25.42 kg/m  SpO2: SpO2: 98 % O2 Device: O2 Device: Nasal Cannula O2 Flow Rate: O2 Flow Rate (L/min):  2 L/min    Patient Active Problem List   Diagnosis Date Noted   Mild cognitive impairment 12/28/2023   Acute on chronic combined systolic (congestive) and diastolic (congestive) heart failure (HCC) 12/28/2023   Adverse effect  of valaciclovir 10/14/2023   Hypertensive nephrosclerosis 08/08/2023   Chronic diastolic CHF (congestive heart failure) (HCC) 08/08/2023   Hypomagnesemia 10/31/2022   CKD (chronic kidney disease) stage 5, GFR less than 15 ml/min (HCC) 10/31/2022   Pulmonary hypertension, unspecified (HCC) 12/06/2021   Chronic cough 10/18/2021   Chronic rhinitis 11/24/2019   Hyponatremia 11/03/2018   Renal cyst 03/27/2016   CKD (chronic kidney disease) stage 3, GFR 30-59 ml/min (HCC) 08/23/2015   Resistant hypertension 06/29/2014   Lumbago 06/15/2014   Pre-diabetes vs early DM  06/15/2014   Asthma, chronic 04/13/2014   History of DVT (deep vein thrombosis)    Anemia of chronic disease 12/19/2013   Leg edema 06/20/2013   Other dysphagia 02/11/2013   Anxiety state 01/20/2013   Leg cramps 01/20/2013   Back pain 12/05/2012   Family hx of colon cancer 10/21/2012   Family hx of lung cancer 10/21/2012   Family hx-breast malignancy 10/21/2012   GERD (gastroesophageal reflux disease) 09/08/2012   HTN (hypertension) 09/08/2012   Hyperlipidemia 09/08/2012   Varicose veins of leg with complications 12/05/2010    Palliative Care Assessment & Plan   Patient Profile: 89 y.o. female  with past medical history of CKD stage V, hypertension, asthma, anemia of chronic disease, chronic combined systolic and diastolic CHF, anxiety, GERD, hypertension, and hyperlipidemia admitted on 12/28/2023 with shortness of breath and a fall.    Patient oxygen saturation was 87% for EMS and improved to 90s after a DuoNeb.   Patient has had 5 hospitalizations and 1 ED visit in the last 6 months. Patient and family are known to PMT service. PMT has been consulted to assist with goals of care conversation.    Today's Discussion: Reviewed chart. Patient sleeping in no apparent distress. She easily awakens. She states she is doing well and has no concerns. Breakfast is at bedside- but she states it is too early to eat. No family at bedside.  Reminded patient that spiritual care was consulted to assist with [facility] documentation. She is able to tell me that she wants her granddaughter [name] to be her proxy decision maker.   Patient's goals are clear to continue treating the treatable at this time. Family has information on options moving forward. PMT will follow peripherally.   Recommendations/Plan: Continue DNR/DNI Continue treating the treatable Family has good understanding of options moving forward  MOST form completed Spiritual care consult for HCPOA document creation Outpatient palliative ordered at last visit but patient has not been seen yet- [facility] is aware Peripheral PMT support- Please call PMT if needs arise [phone]   Code Status:    Code Status Orders  (From admission, onward)           Start     Ordered   12/28/23 1313  Do not attempt resuscitation (DNR)- Limited -Do Not Intubate (DNI)  Continuous       Question Answer Comment  If pulseless and not breathing No CPR or chest compressions.   In Pre-Arrest Conditions (Patient Is Breathing and Has A Pulse) Do not intubate. Provide all appropriate non-invasive medical interventions. Avoid ICU transfer unless indicated or required.   Consent: Discussion documented in EHR or advanced directives reviewed      12/28/23 1312         Extensive chart review has been completed prior to seeing the patient including labs, vital signs, imaging, progress/consult notes, orders, medications, and available advance directive documents.  Care plan was discussed with Dr. Fairy  Time spent: 25  minutes  Thank you for allowing the Palliative Medicine Team to assist in the care of this patient.     Stephane CHRISTELLA Palin, NP  Please  contact Palliative Medicine Team phone at [phone] for questions and concerns.

## 2023-12-30 NOTE — Progress Notes (Addendum)
 PROGRESS NOTE    [name]  FMW:[id number] DOB: 08-02-1933 DOA: 12/28/2023 PCP: Charlett Apolinar POUR, MD   89 y.o. female  with past medical history of CKD stage V, hypertension, asthma, anemia of chronic disease, chronic combined systolic and diastolic CHF, anxiety, GERD, hypertension, and hyperlipidemia admitted on 12/28/2023 with shortness of breath and a fall.  Increased dyspnea last 1 to 2 days, recently discharged after hospitalization for same, CKD 5 followed by nephrology and palliative care, not a dialysis candidate .hypoxic in the ED, placed on O2 patient has had 5 hospitalizations and 1 ED visit in the last 6 months. Patient and family are known to PMT service. PMT has been consulted to assist with goals of care conversation.    Subjective: -Feels fair, no events overnight, reports a fall yesterday, mild dyspnea  Assessment and Plan:  Acute on chronic combined systolic and diastolic CHF - Recent echo 6/25 EF 45-50%, global hypokinesis, indeterminate diastolic function, normal RV function. - Limited diuresis but urine output has been inaccurate, increase Lasix  to 120 mg twice daily -Seen by nephrology few weeks ago in the hospital, not felt to be a dialysis candidate, frequent hospitalizations in the setting of volume overload from CKD 5 and CHF - Seen by palliative care again, family refuses hospice at this time -GDMT limited by CKD 5   Hypertension -Stable continue amlodipine , Coreg , hydralazine  - Diuresis as above   GERD - Not on medication for this   CKD 5 Hyperkalemia -Creatinine relatively stable at baseline, she is not a dialysis candidate, potassium was 6.3 on admission, now improved -See discussion above, not a dialysis candidate, this has been clarified by nephrology few weeks ago -Continue palliative care discussions   Anxiety - Continue home Celexa  and as needed Ativan    Anemia of chronic disease - Hemoglobin trend down to 7.2 today, recent anemia panel in  July suggestive of chronic disease, given dose of EPO X1   Asthma - Resume home Breztri  and as needed albuterol    History of DVT - Noted   DVT prophylaxis: Heparin  subcutaneous Code Status: DNR Family Communication: No family at bedside today Disposition Plan:   Consultants:    Procedures:   Antimicrobials:    Objective: Vitals:   12/30/23 0300 12/30/23 0302 12/30/23 0716 12/30/23 0812  BP: (!) 153/61  (!) 159/70   Pulse: 72  71 70  Resp: 20  19 17   Temp: 98.3 F (36.8 C)  98.4 F (36.9 C)   TempSrc: Oral  Oral   SpO2: 98%  99% 98%  Weight:  65.1 kg      Intake/Output Summary (Last 24 hours) at 12/30/2023 1042 Last data filed at 12/30/2023 0300 Gross per 24 hour  Intake 870 ml  Output 200 ml  Net 670 ml   Filed Weights   12/29/23 0427 12/30/23 0302  Weight: 63.3 kg 65.1 kg    Examination:  General exam: Appears calm and comfortable, cognitive deficits HEENT, right eye subconjunctival hemorrhage Respiratory system: Decreased breath sounds at the bases Cardiovascular system: S1 & S2 heard, RRR.  Abd: nondistended, soft and nontender.Normal bowel sounds heard. Central nervous system: Alert and oriented. No focal neurological deficits. Extremities: 1+ edema Skin: No rashes Psychiatry:  Mood & affect appropriate.     Data Reviewed:   CBC: Recent Labs  Lab 12/28/23 0939 12/28/23 1028 12/29/23 0229 12/30/23 0232  WBC 7.4  --  4.1 5.4  NEUTROABS 5.7  --   --   --  HGB 8.7* 8.8* 7.2* 8.0*  HCT 28.3* 26.0* 23.9* 26.3*  MCV 99.3  --  100.0 99.6  PLT 246  --  216 264   Basic Metabolic Panel: Recent Labs  Lab 12/28/23 1028 12/28/23 1135 12/29/23 0229 12/30/23 0232  NA 140 138 139 139  K 6.3* 5.6* 5.3* 5.1  CL  --  112* 111 111  CO2  --  18* 19* 18*  GLUCOSE  --  119* 136* 112*  BUN  --  81* 81* 77*  CREATININE  --  6.07* 6.25* 6.33*  CALCIUM   --  7.9* 8.2* 8.2*  MG  --  2.2  --   --    GFR: Estimated Creatinine Clearance: 5.7 mL/min (A)  (by C-G formula based on SCr of 6.33 mg/dL (H)). Liver Function Tests: Recent Labs  Lab 12/28/23 1135 12/29/23 0229  AST 18 13*  ALT <5 6  ALKPHOS 35* 35*  BILITOT 0.5 0.5  PROT 5.3* 5.3*  ALBUMIN  2.1* 2.1*   No results for input(s): LIPASE, AMYLASE in the last 168 hours. No results for input(s): AMMONIA in the last 168 hours. Coagulation Profile: No results for input(s): INR, PROTIME in the last 168 hours. Cardiac Enzymes: No results for input(s): CKTOTAL, CKMB, CKMBINDEX, TROPONINI in the last 168 hours. BNP (last 3 results) Recent Labs    09/22/23 0036  PROBNP >35,000.0*   HbA1C: No results for input(s): HGBA1C in the last 72 hours. CBG: No results for input(s): GLUCAP in the last 168 hours. Lipid Profile: No results for input(s): CHOL, HDL, LDLCALC, TRIG, CHOLHDL, LDLDIRECT in the last 72 hours. Thyroid  Function Tests: No results for input(s): TSH, T4TOTAL, FREET4, T3FREE, THYROIDAB in the last 72 hours. Anemia Panel: No results for input(s): VITAMINB12, FOLATE, FERRITIN, TIBC, IRON, RETICCTPCT in the last 72 hours. Urine analysis:    Component Value Date/Time   COLORURINE YELLOW 10/14/2023 1720   APPEARANCEUR CLEAR 10/14/2023 1720   LABSPEC 1.013 10/14/2023 1720   PHURINE 6.0 10/14/2023 1720   GLUCOSEU 50 (A) 10/14/2023 1720   GLUCOSEU NEGATIVE 01/16/2023 1238   HGBUR NEGATIVE 10/14/2023 1720   BILIRUBINUR NEGATIVE 10/14/2023 1720   KETONESUR NEGATIVE 10/14/2023 1720   PROTEINUR >=300 (A) 10/14/2023 1720   UROBILINOGEN 0.2 01/16/2023 1238   NITRITE NEGATIVE 10/14/2023 1720   LEUKOCYTESUR NEGATIVE 10/14/2023 1720   Sepsis Labs: @LABRCNTIP (procalcitonin:4,lacticidven:4)  ) Recent Results (from the past 240 hours)  Culture, blood (single)     Status: None (Preliminary result)   Collection Time: 12/28/23  9:29 AM   Specimen: BLOOD RIGHT HAND  Result Value Ref Range Status   Specimen Description BLOOD  RIGHT HAND  Final   Special Requests   Final    BOTTLES DRAWN AEROBIC AND ANAEROBIC Blood Culture results may not be optimal due to an inadequate volume of blood received in culture bottles   Culture   Final    NO GROWTH 2 DAYS Performed at [facility], [address], [place], [place] [address]    Report Status PENDING  Incomplete  Resp panel by RT-PCR (RSV, Flu A&B, Covid) Anterior Nasal Swab     Status: None   Collection Time: 12/28/23  1:12 PM   Specimen: Anterior Nasal Swab  Result Value Ref Range Status   SARS Coronavirus 2 by RT PCR NEGATIVE NEGATIVE Final   Influenza A by PCR NEGATIVE NEGATIVE Final   Influenza B by PCR NEGATIVE NEGATIVE Final    Comment: (NOTE) The Xpert Xpress SARS-CoV-2/FLU/RSV plus assay  is intended as an aid in the diagnosis of influenza from Nasopharyngeal swab specimens and should not be used as a sole basis for treatment. Nasal washings and aspirates are unacceptable for Xpert Xpress SARS-CoV-2/FLU/RSV testing.  Fact Sheet for Patients: BloggerCourse.com  Fact Sheet for Healthcare Providers: SeriousBroker.it  This test is not yet approved or cleared by the [place]  FDA and has been authorized for detection and/or diagnosis of SARS-CoV-2 by FDA under an Emergency Use Authorization (EUA). This EUA will remain in effect (meaning this test can be used) for the duration of the COVID-19 declaration under Section 564(b)(1) of the Act, 21 U.S.C. section 360bbb-3(b)(1), unless the authorization is terminated or revoked.     Resp Syncytial Virus by PCR NEGATIVE NEGATIVE Final    Comment: (NOTE) Fact Sheet for Patients: BloggerCourse.com  Fact Sheet for Healthcare Providers: SeriousBroker.it  This test is not yet approved or cleared by the [place]  FDA and has been authorized for detection and/or diagnosis of SARS-CoV-2 by FDA under an  Emergency Use Authorization (EUA). This EUA will remain in effect (meaning this test can be used) for the duration of the COVID-19 declaration under Section 564(b)(1) of the Act, 21 U.S.C. section 360bbb-3(b)(1), unless the authorization is terminated or revoked.  Performed at [facility], [address], [place], [place] [address]      Radiology Studies: No results found.    Scheduled Meds:  amLODipine   10 mg Oral Daily   budesonide -glycopyrrolate -formoterol   2 puff Inhalation BID   carvedilol   12.5 mg Oral BID   citalopram   10 mg Oral Daily   feeding supplement (NEPRO CARB STEADY)  237 mL Oral BID BM   heparin   5,000 Units Subcutaneous Q8H   hydrALAZINE   100 mg Oral TID   montelukast   10 mg Oral QHS   sevelamer  carbonate  800 mg Oral TID WC   sodium bicarbonate   650 mg Oral BID   sodium chloride  flush  3 mL Intravenous Q12H   Continuous Infusions:  furosemide        LOS: 1 day    Time spent:    Sigurd Pac, MD [facility]   12/30/2023, 10:42 AM

## 2023-12-31 ENCOUNTER — Other Ambulatory Visit: Payer: Self-pay | Admitting: Nurse Practitioner

## 2023-12-31 DIAGNOSIS — I5043 Acute on chronic combined systolic (congestive) and diastolic (congestive) heart failure: Secondary | ICD-10-CM | POA: Diagnosis not present

## 2023-12-31 DIAGNOSIS — Z515 Encounter for palliative care: Secondary | ICD-10-CM | POA: Diagnosis not present

## 2023-12-31 DIAGNOSIS — D638 Anemia in other chronic diseases classified elsewhere: Secondary | ICD-10-CM

## 2023-12-31 DIAGNOSIS — Z7189 Other specified counseling: Secondary | ICD-10-CM | POA: Diagnosis not present

## 2023-12-31 LAB — BASIC METABOLIC PANEL WITH GFR
Anion gap: 11 (ref 5–15)
BUN: 79 mg/dL — ABNORMAL HIGH (ref 8–23)
CO2: 20 mmol/L — ABNORMAL LOW (ref 22–32)
Calcium: 8.1 mg/dL — ABNORMAL LOW (ref 8.9–10.3)
Chloride: 108 mmol/L (ref 98–111)
Creatinine, Ser: 6.12 mg/dL — ABNORMAL HIGH (ref 0.44–1.00)
GFR, Estimated: 6 mL/min — ABNORMAL LOW (ref >60)
Glucose, Bld: 105 mg/dL — ABNORMAL HIGH (ref 70–99)
Potassium: 5.3 mmol/L — ABNORMAL HIGH (ref 3.5–5.1)
Sodium: 139 mmol/L (ref 135–145)

## 2023-12-31 MED ORDER — SODIUM ZIRCONIUM CYCLOSILICATE 10 G PO PACK
10.0000 g | PACK | Freq: Once | ORAL | Status: AC
  Administered 2023-12-31: 10 g via ORAL
  Filled 2023-12-31: qty 1

## 2023-12-31 MED ORDER — POLYETHYLENE GLYCOL 3350 17 G PO PACK
17.0000 g | PACK | Freq: Every day | ORAL | Status: DC
  Administered 2023-12-31 – 2024-01-04 (×5): 17 g via ORAL
  Filled 2023-12-31 (×6): qty 1

## 2023-12-31 NOTE — Progress Notes (Deleted)
 Patient Care Team: Panosh, Angela POUR, MD as PCP - General (Internal Medicine) Nahser, Aleene PARAS, MD (Inactive) as PCP - Cardiology (Cardiology) Addie, Cordella Hamilton, MD (Orthopedic Surgery) Lanny Callander, MD as Consulting Physician (Hematology) Gearline Norris, MD as Consulting Physician (Nephrology) Cleatus Collar, MD as Consulting Physician (Ophthalmology) Maree Paticia BRAVO, MD as Referring Physician (Ophthalmology) Shelah Lamar RAMAN, MD as Consulting Physician (Pulmonary Disease) Lionell Jon DEL, [facility] (Pharmacist) Claudette, Md, MD (Cardiology) Gardenia Led, DO as Consulting Physician (Cardiology)  Clinic Day:  12/31/2023  Referring physician: Charlett Angela POUR, MD  ASSESSMENT & PLAN:   Assessment & Plan: Anemia of chronic disease -Her anemia is likely secondary to CKD  -she was started on Aranesp  q2weeks injections on 03/24/21. She understood risks vs benefits of this medication, including increased risk of thrombosis.  -hospitalized for pneumonia in 10/2022, anemia got worse and aranesp  dosing was increased.  Most recently, Aranesp  held due to normal hemoglobin level.  -Her anemia overall well managed, will continue Aranesp  as needed     The patient understands the plans discussed today and is in agreement with them.  She knows to contact our office if she develops concerns prior to her next appointment.  I provided *** minutes of face-to-face time during this encounter and > 50% was spent counseling as documented under my assessment and plan.    [name], NP  [facility] - A DEPT OF [name] [address] [place] [address] Dept: [phone] Dept Fax: [phone]   No orders of the defined types were placed in this encounter.     CHIEF COMPLAINT:  CC: Anemia of chronic disease  Current Treatment: Aranesp  100 mcg monthly hold for Hgb > 11.0)  INTERVAL HISTORY:  [name] is here today for repeat  clinical assessment. She was last seen by Dr. Lanny on 07/17/2023.  Routine visit in July 2025 was rescheduled due to hospitalization.  When reviewing for today's visit, appears that patient is hospitalized for acute on chronic heart failure.  She denies fevers or chills. She denies pain. Her appetite is good. Her weight {Weight change:10426}.  I have reviewed the past medical history, past surgical history, social history and family history with the patient and they are unchanged from previous note.  ALLERGIES:  is allergic to penicillins, asa [aspirin], and fish allergy.  MEDICATIONS:  No current facility-administered medications for this visit.   No current outpatient medications on file.   Facility-Administered Medications Ordered in Other Visits  Medication Dose Route Frequency Provider Last Rate Last Admin   acetaminophen  (TYLENOL ) tablet 650 mg  650 mg Oral Q6H PRN [name], MD   650 mg at 12/28/23 2019   Or   acetaminophen  (TYLENOL ) suppository 650 mg  650 mg Rectal Q6H PRN [name], MD       albuterol  (PROVENTIL ) (2.5 MG/3ML) 0.083% nebulizer solution 2.5 mg  2.5 mg Nebulization Q6H PRN [name], MD   2.5 mg at 12/29/23 1316   amLODipine  (NORVASC ) tablet 10 mg  10 mg Oral Daily Melvin, Alexander B, MD   10 mg at 12/31/23 0846   artificial tears ophthalmic solution 2 drop  2 drop Both Eyes PRN Melvin, Alexander B, MD       budesonide -glycopyrrolate -formoterol  (BREZTRI ) 160-9-4.8 MCG/ACT inhaler 2 puff  2 puff Inhalation BID Melvin, Alexander B, MD   2 puff at 12/31/23 0837   carvedilol  (COREG ) tablet 12.5 mg  12.5 mg Oral BID Melvin, Alexander B, MD   12.5 mg at 12/31/23 9152   citalopram  (CELEXA ) tablet 10 mg  10 mg Oral Daily Melvin, Alexander B, MD   10 mg at 12/31/23 0847   feeding supplement (NEPRO CARB STEADY) liquid 237 mL  237 mL Oral BID BM Melvin, Alexander B, MD   237 mL at 12/31/23 1321   furosemide  (LASIX ) 120 mg in dextrose  5 % 50 mL IVPB   120 mg Intravenous BID [name], MD 62 mL/hr at 12/31/23 0845 120 mg at 12/31/23 0845   heparin  injection 5,000 Units  5,000 Units Subcutaneous Q8H [name], MD   5,000 Units at 12/31/23 1319   hydrALAZINE  (APRESOLINE ) tablet 100 mg  100 mg Oral TID Melvin, Alexander B, MD   100 mg at 12/31/23 1735   LORazepam  (ATIVAN ) tablet 0.5 mg  0.5 mg Oral BID PRN [name], MD   0.5 mg at 12/30/23 1639   montelukast  (SINGULAIR ) tablet 10 mg  10 mg Oral QHS Melvin, Alexander B, MD   10 mg at 12/30/23 2125   polyethylene glycol (MIRALAX  / GLYCOLAX ) packet 17 g  17 g Oral Daily [name], MD   17 g at 12/31/23 0847   sevelamer  carbonate (RENVELA ) tablet 800 mg  800 mg Oral TID WC Melvin, Alexander B, MD   800 mg at 12/31/23 1735   sodium bicarbonate  tablet 650 mg  650 mg Oral BID Melvin, Alexander B, MD   650 mg at 12/31/23 0847   sodium chloride  flush (NS) 0.9 % injection 3 mL  3 mL Intravenous Q12H [name], MD   3 mL at 12/31/23 0848    HISTORY OF PRESENT ILLNESS:   Oncology History   No history exists.      REVIEW OF SYSTEMS:   Constitutional: Denies fevers, chills or abnormal weight loss Eyes: Denies blurriness of vision Ears, nose, mouth, throat, and face: Denies mucositis or sore throat Respiratory: Denies cough, dyspnea or wheezes Cardiovascular: Denies palpitation, chest discomfort or lower extremity swelling Gastrointestinal:  Denies nausea, heartburn or change in bowel habits Skin: Denies abnormal skin rashes Lymphatics: Denies new lymphadenopathy or easy bruising Neurological:Denies numbness, tingling or new weaknesses Behavioral/Psych: Mood is stable, no new changes  All other systems were reviewed with the patient and are negative.   VITALS:  There were no vitals taken for this visit.  Wt Readings from Last 3 Encounters:  12/31/23 138 lb 14.2 oz (63 kg)  12/08/23 133 lb 6.1 oz (60.5 kg)  10/20/23 132 lb 11.5 oz (60.2 kg)    There  is no height or weight on file to calculate BMI.  Performance status (ECOG): {CHL ONC [id number]  PHYSICAL EXAM:   GENERAL:alert, no distress and comfortable SKIN: skin color, texture, turgor are normal, no rashes or significant lesions EYES: normal, Conjunctiva are pink and non-injected, sclera clear OROPHARYNX:no exudate, no erythema and lips, buccal mucosa, and tongue normal  NECK: supple, thyroid  normal size, non-tender, without nodularity LYMPH:  no palpable lymphadenopathy in the cervical, axillary or inguinal LUNGS: clear to auscultation and percussion with normal breathing effort HEART: regular rate & rhythm and no murmurs and no lower extremity edema ABDOMEN:abdomen soft, non-tender and normal bowel sounds Musculoskeletal:no cyanosis of digits and no clubbing  NEURO: alert & oriented x 3 with fluent speech, no focal motor/sensory deficits  LABORATORY DATA:  I have reviewed the data as listed    Component Value Date/Time   NA 139  12/31/2023 0326   NA 144 09/06/2023 0000   NA 136 06/19/2016 1301   K 5.3 (H) 12/31/2023 0326   K 3.8 06/19/2016 1301   CL 108 12/31/2023 0326   CO2 20 (L) 12/31/2023 0326   CO2 31 (H) 06/19/2016 1301   GLUCOSE 105 (H) 12/31/2023 0326   GLUCOSE 105 06/19/2016 1301   BUN 79 (H) 12/31/2023 0326   BUN 78 (A) 09/06/2023 0000   BUN 26.6 (H) 06/19/2016 1301   CREATININE 6.12 (H) 12/31/2023 0326   CREATININE 2.31 (H) 03/10/2020 1149   CREATININE 1.4 (H) 06/19/2016 1301   CALCIUM  8.1 (L) 12/31/2023 0326   CALCIUM  9.8 06/19/2016 1301   PROT 5.3 (L) 12/29/2023 0229   PROT 6.6 06/19/2016 1301   ALBUMIN  2.1 (L) 12/29/2023 0229   ALBUMIN  3.5 06/19/2016 1301   AST 13 (L) 12/29/2023 0229   AST 22 06/19/2016 1301   ALT 6 12/29/2023 0229   ALT 14 06/19/2016 1301   ALKPHOS 35 (L) 12/29/2023 0229   ALKPHOS 37 (L) 06/19/2016 1301   BILITOT 0.5 12/29/2023 0229   BILITOT 0.32 06/19/2016 1301   GFRNONAA 6 (L) 12/31/2023 0326   GFRNONAA 19 (L)  03/10/2020 1149   GFRAA 19 11/04/2020 0000   GFRAA 22 (L) 03/10/2020 1149    No results found for: SPEP, UPEP  Lab Results  Component Value Date   WBC 5.4 12/30/2023   NEUTROABS 5.7 12/28/2023   HGB 8.0 (L) 12/30/2023   HCT 26.3 (L) 12/30/2023   MCV 99.6 12/30/2023   PLT 264 12/30/2023      Chemistry      Component Value Date/Time   NA 139 12/31/2023 0326   NA 144 09/06/2023 0000   NA 136 06/19/2016 1301   K 5.3 (H) 12/31/2023 0326   K 3.8 06/19/2016 1301   CL 108 12/31/2023 0326   CO2 20 (L) 12/31/2023 0326   CO2 31 (H) 06/19/2016 1301   BUN 79 (H) 12/31/2023 0326   BUN 78 (A) 09/06/2023 0000   BUN 26.6 (H) 06/19/2016 1301   CREATININE 6.12 (H) 12/31/2023 0326   CREATININE 2.31 (H) 03/10/2020 1149   CREATININE 1.4 (H) 06/19/2016 1301   GLU 128 09/06/2023 0000      Component Value Date/Time   CALCIUM  8.1 (L) 12/31/2023 0326   CALCIUM  9.8 06/19/2016 1301   ALKPHOS 35 (L) 12/29/2023 0229   ALKPHOS 37 (L) 06/19/2016 1301   AST 13 (L) 12/29/2023 0229   AST 22 06/19/2016 1301   ALT 6 12/29/2023 0229   ALT 14 06/19/2016 1301   BILITOT 0.5 12/29/2023 0229   BILITOT 0.32 06/19/2016 1301       RADIOGRAPHIC STUDIES: I have personally reviewed the radiological images as listed and agreed with the findings in the report. CT Maxillofacial Wo Contrast Result Date: 12/28/2023 EXAM: CT Face without contrast 12/28/2023 10:09:20 AM TECHNIQUE: CT of the face was performed without the administration of intravenous contrast. Multiplanar reformatted images are provided for review. Automated exposure control, iterative reconstruction, and/or weight based adjustment of the mA/kV was utilized to reduce the radiation dose to as low as reasonably achievable. COMPARISON: None available CLINICAL HISTORY: Facial trauma, blunt. FINDINGS: AERODIGESTIVE TRACT: No mass. No edema. SALIVARY GLANDS: No acute abnormality. LYMPH NODES: No suspicious cervical lymphadenopathy. SOFT TISSUES: No mass  or fluid collection. BRAIN, ORBITS AND SINUSES: No acute abnormality. Patient is status post bilateral lens replacement. BONES: No acute abnormality. No suspicious bone lesion. The patient is edentulous. LIMITATIONS/ARTIFACTS: There  is mild motion artifact. IMPRESSION: 1. No acute abnormality of the face related to blunt facial trauma. Electronically signed by: evalene coho 12/28/2023 10:48 AM EDT RP Workstation: [id number]   CT Head Wo Contrast Result Date: 12/28/2023 EXAM: CT HEAD WITHOUT CONTRAST 12/28/2023 10:09:20 AM TECHNIQUE: CT of the head was performed without the administration of intravenous contrast. Automated exposure control, iterative reconstruction, and/or weight based adjustment of the mA/kV was utilized to reduce the radiation dose to as low as reasonably achievable. COMPARISON: CT of the head dated 10/17/2023. CLINICAL HISTORY: Head trauma, minor (Age >= 65y). FINDINGS: BRAIN AND VENTRICLES: No acute hemorrhage. Gray-white differentiation is preserved. No hydrocephalus. No extra-axial collection. No mass effect or midline shift. Age-related atrophy and mild periventricular white matter disease. ORBITS: The patient is status post bilateral lens replacement and right scleral banding. SINUSES: No acute abnormality. SOFT TISSUES AND SKULL: There is a left parietal scalp hematoma. No skull fracture. IMPRESSION: 1. No acute intracranial abnormality. 2. Left parietal scalp hematoma. Electronically signed by: evalene coho 12/28/2023 10:45 AM EDT RP Workstation: [id number]   DG Chest Port 1 View Result Date: 12/28/2023 CLINICAL DATA:  dyspnea ho COPD CHF EXAM: PORTABLE CHEST - 1 VIEW COMPARISON:  December 02, 2023 FINDINGS: Interval development of patchy airspace opacities in both upper lung zones. Probable trace right pleural effusion. No pneumothorax. Moderate cardiomegaly. Tortuous aorta with aortic atherosclerosis. No acute fracture or destructive lesions. Multilevel thoracic osteophytosis.  IMPRESSION: Interval development of patchy airspace opacities in both upper lung zones, worrisome for a multifocal pneumonia. Probable trace right pleural effusion. Electronically Signed   By: Rogelia Myers M.D.   On: 12/28/2023 10:03   DG Chest Portable 1 View Result Date: 12/02/2023 EXAM: 1 VIEW XRAY OF THE CHEST 12/02/2023 03:03:00 AM COMPARISON: 11/30/2023 CLINICAL HISTORY: Chest pain, shortness of breath with feet and ankles swollen for 2 days. FINDINGS: LUNGS AND PLEURA: Mild interstitial edema. Possible small bilateral pleural effusions. No pneumothorax. HEART AND MEDIASTINUM: Cardiomegaly. BONES AND SOFT TISSUES: No acute osseous abnormality. IMPRESSION: 1. Cardiomegaly with mild interstitial edema. 2. Possible small bilateral pleural effusions. Electronically signed by: Pinkie Pebbles MD 12/02/2023 03:05 AM EDT RP Workstation: [id number]

## 2023-12-31 NOTE — Progress Notes (Signed)
   12/30/23 1112  Spiritual Encounters  Type of Visit Initial  Care provided to: Pt and family  Reason for visit Advance directives  OnCall Visit Yes  Spiritual Framework  Presenting Themes Impactful experiences and emotions  Community/Connection Family  Patient Stress Factors Health changes  Family Stress Factors Health changes  Interventions  Spiritual Care Interventions Made Compassionate presence;Established relationship of care and support  Intervention Outcomes  Outcomes Awareness of support;Awareness of health   Chaplain visited patient and family at bedside. Chaplain inquired how I could be supportive during this time. Pt and family expressed no current needs. Chaplain remains available for emotional and spiritual support as needed.

## 2023-12-31 NOTE — Plan of Care (Signed)

## 2023-12-31 NOTE — Assessment & Plan Note (Deleted)
-  Her anemia is likely secondary to CKD  -she was started on Aranesp  q2weeks injections on 03/24/21. She understood risks vs benefits of this medication, including increased risk of thrombosis.  -hospitalized for pneumonia in 10/2022, anemia got worse and aranesp  dosing was increased.  Most recently, Aranesp  held due to normal hemoglobin level.  -Her anemia overall well managed, will continue Aranesp  as needed

## 2023-12-31 NOTE — Plan of Care (Signed)
   Problem: Safety: Goal: Ability to remain free from injury will improve Outcome: Progressing   Problem: Skin Integrity: Goal: Risk for impaired skin integrity will decrease Outcome: Progressing

## 2023-12-31 NOTE — Progress Notes (Signed)
   12/31/23 1121  Spiritual Encounters  Type of Visit Attempt (pt unavailable)   Chaplain stop by to discuss Advance Directive Pt was sleeping. Chaplain will follow up at a later time.

## 2023-12-31 NOTE — Progress Notes (Signed)
 Dr Charlton updated through the night due to patients core temp being 94.1 rectally. Bair applied for about 4 hours  94.1, 94.7, 95.1,  97.8  Today's Vitals   12/31/23 0300 12/31/23 0325 12/31/23 0400 12/31/23 0500  BP:  132/61    Pulse:  73 71   Resp: (!) 29  (!) 33   Temp:  97.8 F (36.6 C)    TempSrc:  Oral    SpO2:  97% 94%   Weight:    63 kg  PainSc:

## 2023-12-31 NOTE — Progress Notes (Signed)
 PROGRESS NOTE    [name]  FMW:[id number] DOB: 03/06/1934 DOA: 12/28/2023 PCP: Charlett Apolinar POUR, MD   89 y.o. female  with past medical history of CKD stage V, hypertension, asthma, anemia of chronic disease, chronic combined systolic and diastolic CHF, anxiety, GERD, hypertension, and hyperlipidemia admitted on 12/28/2023 with shortness of breath and a fall.  Increased dyspnea last 1 to 2 days, recently discharged after hospitalization for same, CKD 5 followed by nephrology and palliative care, not a dialysis candidate .hypoxic in the ED, placed on O2 patient has had 5 hospitalizations and 1 ED visit in the last 6 months. Patient and family are known to PMT service. PMT has been consulted to assist with goals of care conversation.    Subjective: - Still with some dyspnea, complains of constipation  Assessment and Plan:  Acute on chronic combined systolic and diastolic CHF - Recent echo 6/25 EF 45-50%, global hypokinesis, indeterminate diastolic function, normal RV function. - Limited diuresis, weight down 5 LB, continue IV Lasix  120 mg twice daily today -Seen by nephrology few weeks ago in the hospital, not felt to be a dialysis candidate, frequent hospitalizations in the setting of volume overload from CKD 5 and CHF - Seen by palliative care again, family refuses hospice at this time, will call again tomorrow -GDMT limited by CKD 5 - Transition to oral torsemide  soon, DC home with outpatient palliative care, high risk of quick rehospitalization   Hypertension -Stable continue amlodipine , Coreg , hydralazine  - Diuresis as above   GERD - Not on medication for this   CKD 5 Hyperkalemia -Creatinine relatively stable at baseline, she is not a dialysis candidate, -Potassium elevated again, repeat Lokelma  -See discussion above, not a dialysis candidate, this has been clarified by nephrology few weeks ago -Continue palliative care discussions   Anxiety - Continue home Celexa  and  as needed Ativan    Anemia of chronic disease - Hemoglobin trend down to 7.2 today, recent anemia panel in July suggestive of chronic disease, given dose of EPO X1   Asthma - Resume home Breztri  and as needed albuterol    History of DVT - Noted   DVT prophylaxis: Heparin  subcutaneous Code Status: DNR Family Communication: No family at bedside today, will call granddaughter Disposition Plan: Home likely 48 hours  Consultants:    Procedures:   Antimicrobials:    Objective: Vitals:   12/31/23 0500 12/31/23 0600 12/31/23 0725 12/31/23 0837  BP:   134/61   Pulse: 70 72 72 71  Resp: 20 17 20 18   Temp:      TempSrc:      SpO2: 98% 99% 91% 91%  Weight: 63 kg       Intake/Output Summary (Last 24 hours) at 12/31/2023 1004 Last data filed at 12/31/2023 0400 Gross per 24 hour  Intake 584 ml  Output 600 ml  Net -16 ml   Filed Weights   12/29/23 0427 12/30/23 0302 12/31/23 0500  Weight: 63.3 kg 65.1 kg 63 kg    Examination:  General exam: Appears calm and comfortable, cognitive deficits HEENT, right eye subconjunctival hemorrhage, positive JVD Respiratory system: Few basilar rales, poor air movement Cardiovascular system: S1 & S2 heard, RRR.  Abd: nondistended, soft and nontender.Normal bowel sounds heard. Central nervous system: Alert and oriented. No focal neurological deficits. Extremities: Edema Skin: No rashes Psychiatry:  Mood & affect appropriate.     Data Reviewed:   CBC: Recent Labs  Lab 12/28/23 0939 12/28/23 1028 12/29/23 0229 12/30/23 0232  WBC 7.4  --  4.1 5.4  NEUTROABS 5.7  --   --   --   HGB 8.7* 8.8* 7.2* 8.0*  HCT 28.3* 26.0* 23.9* 26.3*  MCV 99.3  --  100.0 99.6  PLT 246  --  216 264   Basic Metabolic Panel: Recent Labs  Lab 12/28/23 1028 12/28/23 1135 12/29/23 0229 12/30/23 0232 12/31/23 0326  NA 140 138 139 139 139  K 6.3* 5.6* 5.3* 5.1 5.3*  CL  --  112* 111 111 108  CO2  --  18* 19* 18* 20*  GLUCOSE  --  119* 136* 112*  105*  BUN  --  81* 81* 77* 79*  CREATININE  --  6.07* 6.25* 6.33* 6.12*  CALCIUM   --  7.9* 8.2* 8.2* 8.1*  MG  --  2.2  --   --   --    GFR: Estimated Creatinine Clearance: 5.8 mL/min (A) (by C-G formula based on SCr of 6.12 mg/dL (H)). Liver Function Tests: Recent Labs  Lab 12/28/23 1135 12/29/23 0229  AST 18 13*  ALT <5 6  ALKPHOS 35* 35*  BILITOT 0.5 0.5  PROT 5.3* 5.3*  ALBUMIN  2.1* 2.1*   No results for input(s): LIPASE, AMYLASE in the last 168 hours. No results for input(s): AMMONIA in the last 168 hours. Coagulation Profile: No results for input(s): INR, PROTIME in the last 168 hours. Cardiac Enzymes: No results for input(s): CKTOTAL, CKMB, CKMBINDEX, TROPONINI in the last 168 hours. BNP (last 3 results) Recent Labs    09/22/23 0036  PROBNP >35,000.0*   HbA1C: No results for input(s): HGBA1C in the last 72 hours. CBG: Recent Labs  Lab 12/30/23 2324  GLUCAP 137*   Lipid Profile: No results for input(s): CHOL, HDL, LDLCALC, TRIG, CHOLHDL, LDLDIRECT in the last 72 hours. Thyroid  Function Tests: No results for input(s): TSH, T4TOTAL, FREET4, T3FREE, THYROIDAB in the last 72 hours. Anemia Panel: No results for input(s): VITAMINB12, FOLATE, FERRITIN, TIBC, IRON, RETICCTPCT in the last 72 hours. Urine analysis:    Component Value Date/Time   COLORURINE YELLOW 10/14/2023 1720   APPEARANCEUR CLEAR 10/14/2023 1720   LABSPEC 1.013 10/14/2023 1720   PHURINE 6.0 10/14/2023 1720   GLUCOSEU 50 (A) 10/14/2023 1720   GLUCOSEU NEGATIVE 01/16/2023 1238   HGBUR NEGATIVE 10/14/2023 1720   BILIRUBINUR NEGATIVE 10/14/2023 1720   KETONESUR NEGATIVE 10/14/2023 1720   PROTEINUR >=300 (A) 10/14/2023 1720   UROBILINOGEN 0.2 01/16/2023 1238   NITRITE NEGATIVE 10/14/2023 1720   LEUKOCYTESUR NEGATIVE 10/14/2023 1720   Sepsis Labs: @LABRCNTIP (procalcitonin:4,lacticidven:4)  ) Recent Results (from the past 240 hours)   Culture, blood (single)     Status: None (Preliminary result)   Collection Time: 12/28/23  9:29 AM   Specimen: BLOOD RIGHT HAND  Result Value Ref Range Status   Specimen Description BLOOD RIGHT HAND  Final   Special Requests   Final    BOTTLES DRAWN AEROBIC AND ANAEROBIC Blood Culture results may not be optimal due to an inadequate volume of blood received in culture bottles   Culture   Final    NO GROWTH 3 DAYS Performed at [facility], [address], [place], [place] [address]    Report Status PENDING  Incomplete  Resp panel by RT-PCR (RSV, Flu A&B, Covid) Anterior Nasal Swab     Status: None   Collection Time: 12/28/23  1:12 PM   Specimen: Anterior Nasal Swab  Result Value Ref Range Status   SARS Coronavirus 2 by RT PCR NEGATIVE NEGATIVE Final  Influenza A by PCR NEGATIVE NEGATIVE Final   Influenza B by PCR NEGATIVE NEGATIVE Final    Comment: (NOTE) The Xpert Xpress SARS-CoV-2/FLU/RSV plus assay is intended as an aid in the diagnosis of influenza from Nasopharyngeal swab specimens and should not be used as a sole basis for treatment. Nasal washings and aspirates are unacceptable for Xpert Xpress SARS-CoV-2/FLU/RSV testing.  Fact Sheet for Patients: BloggerCourse.com  Fact Sheet for Healthcare Providers: SeriousBroker.it  This test is not yet approved or cleared by the [place]  FDA and has been authorized for detection and/or diagnosis of SARS-CoV-2 by FDA under an Emergency Use Authorization (EUA). This EUA will remain in effect (meaning this test can be used) for the duration of the COVID-19 declaration under Section 564(b)(1) of the Act, 21 U.S.C. section 360bbb-3(b)(1), unless the authorization is terminated or revoked.     Resp Syncytial Virus by PCR NEGATIVE NEGATIVE Final    Comment: (NOTE) Fact Sheet for Patients: BloggerCourse.com  Fact Sheet for Healthcare  Providers: SeriousBroker.it  This test is not yet approved or cleared by the [place]  FDA and has been authorized for detection and/or diagnosis of SARS-CoV-2 by FDA under an Emergency Use Authorization (EUA). This EUA will remain in effect (meaning this test can be used) for the duration of the COVID-19 declaration under Section 564(b)(1) of the Act, 21 U.S.C. section 360bbb-3(b)(1), unless the authorization is terminated or revoked.  Performed at [facility], [address] [place], [place] [address]      Radiology Studies: No results found.    Scheduled Meds:  amLODipine   10 mg Oral Daily   budesonide -glycopyrrolate -formoterol   2 puff Inhalation BID   carvedilol   12.5 mg Oral BID   citalopram   10 mg Oral Daily   feeding supplement (NEPRO CARB STEADY)  237 mL Oral BID BM   heparin   5,000 Units Subcutaneous Q8H   hydrALAZINE   100 mg Oral TID   montelukast   10 mg Oral QHS   polyethylene glycol  17 g Oral Daily   sevelamer  carbonate  800 mg Oral TID WC   sodium bicarbonate   650 mg Oral BID   sodium chloride  flush  3 mL Intravenous Q12H   Continuous Infusions:  furosemide  120 mg (12/31/23 0845)     LOS: 2 days    Time spent:    Sigurd Pac, MD [facility]   12/31/2023, 10:04 AM

## 2023-12-31 NOTE — TOC Initial Note (Addendum)
 Transition of Care [facility]) - Initial/Assessment Note    Patient Details  Name: [name] MRN: [id number] Date of Birth: 09-22-1933  [facility] CM/SW Contact:    Waddell Barnie Rama, RN Phone Number: 12/31/2023, 4:31 PM  Clinical Narrative:                 From home with daughter, has PCP and insurance on file, states has [facility] services in place  with [facility], this NCM confirmed with [facility].  Has BSC and walker at  home.  States family member will transport them home at [employer] and family is support system, states gets medications from [facility].   Pta self ambulatory walker.   Patient gives this NCM permission to speak with daughter. Will need orders for HHRN, HHPT, HHOT.  She is still active with [facility] for outpatient palliative services also.   Expected Discharge Plan: Home w Home Health Services Barriers to Discharge: Continued Medical Work up   Patient Goals and CMS Choice Patient states their goals for this hospitalization and ongoing recovery are:: return home with daughter CMS Medicare.gov Compare Post Acute Care list provided to:: Patient Represenative (must comment) Choice offered to / list presented to : Adult Children      Expected Discharge Plan and Services   Discharge Planning Services: CM Consult Post Acute Care Choice: Home Health Living arrangements for the past 2 months: Single Family Home                   DME Agency: NA       HH Arranged: RN, Disease Management, PT, OT HH Agency: [facility] Date HH Agency Contacted: 12/31/23 Time HH Agency Contacted: 1630 Representative spoke with at [facility] Agency: [facility]  Prior Living Arrangements/Services Living arrangements for the past 2 months: Single Family Home Lives with:: Adult Children Patient language and need for interpreter reviewed:: Yes Do you feel safe going back to the place where you live?: Yes      Need for Family Participation in Patient Care: Yes (Comment) Care giver  support system in place?: Yes (comment) Current home services: DME (walker, bsc) Criminal Activity/Legal Involvement Pertinent to Current Situation/Hospitalization: No - Comment as needed  Activities of Daily Living   ADL Screening (condition at time of admission) Independently performs ADLs?: Yes (appropriate for developmental age) Is the patient deaf or have difficulty hearing?: No Does the patient have difficulty seeing, even when wearing glasses/contacts?: No Does the patient have difficulty concentrating, remembering, or making decisions?: No  Permission Sought/Granted Permission sought to share information with : Case Manager Permission granted to share information with : Yes, Verbal Permission Granted  Share Information with NAME: [name]  Permission granted to share info w AGENCY: [facility]  Permission granted to share info w Relationship: daughter     Emotional Assessment Appearance:: Appears stated age Attitude/Demeanor/Rapport: Engaged Affect (typically observed): Appropriate Orientation: : Oriented to Self, Oriented to Place, Oriented to  Time, Oriented to Situation Alcohol  / Substance Use: Not Applicable Psych Involvement: No (comment)  Admission diagnosis:  ESRD (end stage renal disease) (HCC) [N18.6] Acute on chronic combined systolic (congestive) and diastolic (congestive) heart failure (HCC) [I50.43] Other hypervolemia [E87.79] Dyspnea, unspecified type [R06.00] Patient Active Problem List   Diagnosis Date Noted   Mild cognitive impairment 12/28/2023   Acute on chronic combined systolic (congestive) and diastolic (congestive) heart failure (HCC) 12/28/2023   Adverse effect of valaciclovir 10/14/2023   Hypertensive nephrosclerosis 08/08/2023   Chronic diastolic  CHF (congestive heart failure) (HCC) 08/08/2023   Hypomagnesemia 10/31/2022   CKD (chronic kidney disease) stage 5, GFR less than 15 ml/min (HCC) 10/31/2022   Pulmonary hypertension, unspecified  (HCC) 12/06/2021   Chronic cough 10/18/2021   Chronic rhinitis 11/24/2019   Hyponatremia 11/03/2018   Renal cyst 03/27/2016   CKD (chronic kidney disease) stage 3, GFR 30-59 ml/min (HCC) 08/23/2015   Resistant hypertension 06/29/2014   Lumbago 06/15/2014   Pre-diabetes vs early DM  06/15/2014   Asthma, chronic 04/13/2014   History of DVT (deep vein thrombosis)    Anemia of chronic disease 12/19/2013   Leg edema 06/20/2013   Other dysphagia 02/11/2013   Anxiety state 01/20/2013   Leg cramps 01/20/2013   Back pain 12/05/2012   Family hx of colon cancer 10/21/2012   Family hx of lung cancer 10/21/2012   Family hx-breast malignancy 10/21/2012   GERD (gastroesophageal reflux disease) 09/08/2012   HTN (hypertension) 09/08/2012   Hyperlipidemia 09/08/2012   Varicose veins of leg with complications 12/05/2010   PCP:  Charlett Apolinar POUR, MD Pharmacy:   [facility] [place], [place] - [address] [place] [address] Phone: [phone] Fax: [phone]  [facility] [address] [place] [address] Phone: [phone] Fax: [phone]     Social Drivers of Health (SDOH) Social History: SDOH Screenings   Food Insecurity: No Food Insecurity (12/28/2023)  Housing: Low Risk  (12/28/2023)  Transportation Needs: No Transportation Needs (12/28/2023)  Utilities: Not At Risk (12/28/2023)  Alcohol  Screen: Low Risk  (09/21/2023)  Depression (PHQ2-9): Low Risk  (09/21/2023)  Financial Resource Strain: Low Risk  (09/21/2023)  Physical Activity: Sufficiently Active (09/21/2023)  Social Connections: Moderately Isolated (12/28/2023)  Stress: No Stress Concern Present (09/21/2023)  Tobacco Use: Low Risk  (12/28/2023)  Health Literacy: Adequate Health Literacy (09/21/2023)   SDOH Interventions:     Readmission Risk Interventions    12/31/2023    4:26 PM 12/03/2023   11:27 AM 10/24/2023    2:20 PM  Readmission  Risk Prevention Plan  Transportation Screening Complete Complete Complete  Medication Review Oceanographer) Complete Complete Complete  PCP or Specialist appointment within 3-5 days of discharge  Complete Complete  HRI or Home Care Consult Complete Complete Complete  SW Recovery Care/Counseling Consult   Complete  Palliative Care Screening Complete Not Applicable   Skilled Nursing Facility Not Applicable Not Applicable Complete

## 2024-01-01 ENCOUNTER — Inpatient Hospital Stay: Attending: Nurse Practitioner

## 2024-01-01 ENCOUNTER — Inpatient Hospital Stay

## 2024-01-01 ENCOUNTER — Inpatient Hospital Stay: Admitting: Nurse Practitioner

## 2024-01-01 DIAGNOSIS — Z7189 Other specified counseling: Secondary | ICD-10-CM | POA: Diagnosis not present

## 2024-01-01 DIAGNOSIS — D638 Anemia in other chronic diseases classified elsewhere: Secondary | ICD-10-CM

## 2024-01-01 DIAGNOSIS — Z515 Encounter for palliative care: Secondary | ICD-10-CM | POA: Diagnosis not present

## 2024-01-01 DIAGNOSIS — I5043 Acute on chronic combined systolic (congestive) and diastolic (congestive) heart failure: Secondary | ICD-10-CM | POA: Diagnosis not present

## 2024-01-01 LAB — BASIC METABOLIC PANEL WITH GFR
Anion gap: 12 (ref 5–15)
BUN: 78 mg/dL — ABNORMAL HIGH (ref 8–23)
CO2: 22 mmol/L (ref 22–32)
Calcium: 8.2 mg/dL — ABNORMAL LOW (ref 8.9–10.3)
Chloride: 105 mmol/L (ref 98–111)
Creatinine, Ser: 6.31 mg/dL — ABNORMAL HIGH (ref 0.44–1.00)
GFR, Estimated: 6 mL/min — ABNORMAL LOW (ref >60)
Glucose, Bld: 110 mg/dL — ABNORMAL HIGH (ref 70–99)
Potassium: 5.3 mmol/L — ABNORMAL HIGH (ref 3.5–5.1)
Sodium: 139 mmol/L (ref 135–145)

## 2024-01-01 MED ORDER — SODIUM ZIRCONIUM CYCLOSILICATE 10 G PO PACK
10.0000 g | PACK | Freq: Once | ORAL | Status: AC
  Administered 2024-01-01: 10 g via ORAL
  Filled 2024-01-01: qty 1

## 2024-01-01 NOTE — Progress Notes (Addendum)
 This chaplain responded to PMT NP-Dawn spiritual care consult for creating the Pt. HCPOA. The Pt. is awake and accepting of Advance Directive education. Family is not at the bedside.  The Pt. communicates her choice of HCPOA as [employer] and [employer]. With gentle guidance the Pt. can connect her choice of decision makers with her medical care. The Pt. is unable to repeat back the purpose of a HCPOA.  The Pt. HCPOA document was filled out as a Environmental consultant with the chaplain. Notary and witnesses were not called to complete the document.  The chaplain attempted to call [facility] for an visit update. A HIPAA compliant VM was left for for [name] at the number in Epic.  This chaplain is planning a revisit with the Pt.  **1610 The chaplain is present with the Pt. and family for F/U spiritual care. The Pt. is awake and introduces me to her family. The chaplain updated the Pt. daughter-Myra on the Pt. HCPOA. The chaplain will F/U on HCPOA with the Pt. and PMT on Wednesday.  Chaplain Leeroy Hummer [phone]

## 2024-01-01 NOTE — Progress Notes (Addendum)
 PROGRESS NOTE    [name]  FMW:[id number] DOB: Mar 04, 1934 DOA: 12/28/2023 PCP: Charlett Apolinar POUR, MD   89 y.o. female  with past medical history of CKD stage V, hypertension, asthma, anemia of chronic disease, chronic combined systolic and diastolic CHF, anxiety, GERD, hypertension, and hyperlipidemia admitted on 12/28/2023 with shortness of breath and a fall.  Increased dyspnea last 1 to 2 days, recently discharged after hospitalization for same, CKD 5 followed by nephrology and palliative care, not a dialysis candidate .hypoxic in the ED, placed on O2 patient has had 5 hospitalizations and 1 ED visit in the last 6 months. Patient and family are known to PMT service. PMT has been consulted to assist with goals of care conversation.    Subjective: - Feels fair, weak, some dyspnea overnight, little better this morning  Assessment and Plan:  Acute on chronic combined systolic and diastolic CHF - Recent echo 6/25 EF 45-50%, global hypokinesis, indeterminate diastolic function, normal RV function. - Limited response to diuretics despite high-dose Lasix  of 120 mg twice daily for a few days -Seen by nephrology few weeks ago in the hospital, not felt to be a dialysis candidate, frequent hospitalizations in the setting of volume overload from CKD 5 and CHF - Seen by palliative care again, initially refused hospice, I called and spoke to granddaughter again, discussed lack of response to diuretics and recommended home with hospice in 1 to 2 days -GDMT limited by CKD 5 - Will request palliative care to reach out to family again regarding setting up home hospice prior to DC   Hypertension -Stable continue amlodipine , Coreg , hydralazine  - Diuresis as above   GERD - Not on medication for this   CKD 5 Hyperkalemia -Creatinine relatively stable at baseline, she is not a dialysis candidate, -Potassium elevated again, repeat Lokelma  -See discussion above, not a dialysis candidate, this has been  clarified by nephrology few weeks ago - See discussion above   Anxiety - Continue home Celexa  and as needed Ativan    Anemia of chronic disease - Hemoglobin trend down to 7.2 today, recent anemia panel in July suggestive of chronic disease, given dose of EPO X1   Asthma - Resume home Breztri  and as needed albuterol    History of DVT - Noted   DVT prophylaxis: Heparin  subcutaneous Code Status: DNR Family Communication: No family at bedside, called and updated granddaughter Disposition Plan: Home likely 1-2days  Consultants:    Procedures:   Antimicrobials:    Objective: Vitals:   12/31/23 2334 01/01/24 0412 01/01/24 0715 01/01/24 0725  BP: (!) 135/52  (!) 142/62   Pulse:   73   Resp:   17   Temp: 97.6 F (36.4 C) 98.4 F (36.9 C) 98.2 F (36.8 C)   TempSrc: Oral Oral Oral   SpO2:   99% 99%  Weight:  67.9 kg      Intake/Output Summary (Last 24 hours) at 01/01/2024 1103 Last data filed at 01/01/2024 0715 Gross per 24 hour  Intake 243 ml  Output 500 ml  Net -257 ml   Filed Weights   12/30/23 0302 12/31/23 0500 01/01/24 0412  Weight: 65.1 kg 63 kg 67.9 kg    Examination:  General exam: Appears calm and comfortable, cognitive deficits HEENT, right eye subconjunctival hemorrhage, positive JVD Respiratory system: Few basilar rales, poor air movement Cardiovascular system: S1 & S2 heard, RRR.  Abd: nondistended, soft and nontender.Normal bowel sounds heard. Central nervous system: Alert and oriented. No focal neurological deficits. Extremities: Edema  Skin: No rashes Psychiatry:  Mood & affect appropriate.     Data Reviewed:   CBC: Recent Labs  Lab 12/28/23 0939 12/28/23 1028 12/29/23 0229 12/30/23 0232  WBC 7.4  --  4.1 5.4  NEUTROABS 5.7  --   --   --   HGB 8.7* 8.8* 7.2* 8.0*  HCT 28.3* 26.0* 23.9* 26.3*  MCV 99.3  --  100.0 99.6  PLT 246  --  216 264   Basic Metabolic Panel: Recent Labs  Lab 12/28/23 1135 12/29/23 0229 12/30/23 0232  12/31/23 0326 01/01/24 0300  NA 138 139 139 139 139  K 5.6* 5.3* 5.1 5.3* 5.3*  CL 112* 111 111 108 105  CO2 18* 19* 18* 20* 22  GLUCOSE 119* 136* 112* 105* 110*  BUN 81* 81* 77* 79* 78*  CREATININE 6.07* 6.25* 6.33* 6.12* 6.31*  CALCIUM  7.9* 8.2* 8.2* 8.1* 8.2*  MG 2.2  --   --   --   --    GFR: Estimated Creatinine Clearance: 5.8 mL/min (A) (by C-G formula based on SCr of 6.31 mg/dL (H)). Liver Function Tests: Recent Labs  Lab 12/28/23 1135 12/29/23 0229  AST 18 13*  ALT <5 6  ALKPHOS 35* 35*  BILITOT 0.5 0.5  PROT 5.3* 5.3*  ALBUMIN  2.1* 2.1*   No results for input(s): LIPASE, AMYLASE in the last 168 hours. No results for input(s): AMMONIA in the last 168 hours. Coagulation Profile: No results for input(s): INR, PROTIME in the last 168 hours. Cardiac Enzymes: No results for input(s): CKTOTAL, CKMB, CKMBINDEX, TROPONINI in the last 168 hours. BNP (last 3 results) Recent Labs    09/22/23 0036  PROBNP >35,000.0*   HbA1C: No results for input(s): HGBA1C in the last 72 hours. CBG: Recent Labs  Lab 12/30/23 2324  GLUCAP 137*   Lipid Profile: No results for input(s): CHOL, HDL, LDLCALC, TRIG, CHOLHDL, LDLDIRECT in the last 72 hours. Thyroid  Function Tests: No results for input(s): TSH, T4TOTAL, FREET4, T3FREE, THYROIDAB in the last 72 hours. Anemia Panel: No results for input(s): VITAMINB12, FOLATE, FERRITIN, TIBC, IRON, RETICCTPCT in the last 72 hours. Urine analysis:    Component Value Date/Time   COLORURINE YELLOW 10/14/2023 1720   APPEARANCEUR CLEAR 10/14/2023 1720   LABSPEC 1.013 10/14/2023 1720   PHURINE 6.0 10/14/2023 1720   GLUCOSEU 50 (A) 10/14/2023 1720   GLUCOSEU NEGATIVE 01/16/2023 1238   HGBUR NEGATIVE 10/14/2023 1720   BILIRUBINUR NEGATIVE 10/14/2023 1720   KETONESUR NEGATIVE 10/14/2023 1720   PROTEINUR >=300 (A) 10/14/2023 1720   UROBILINOGEN 0.2 01/16/2023 1238   NITRITE NEGATIVE  10/14/2023 1720   LEUKOCYTESUR NEGATIVE 10/14/2023 1720   Sepsis Labs: @LABRCNTIP (procalcitonin:4,lacticidven:4)  ) Recent Results (from the past 240 hours)  Culture, blood (single)     Status: None (Preliminary result)   Collection Time: 12/28/23  9:29 AM   Specimen: BLOOD RIGHT HAND  Result Value Ref Range Status   Specimen Description BLOOD RIGHT HAND  Final   Special Requests   Final    BOTTLES DRAWN AEROBIC AND ANAEROBIC Blood Culture results may not be optimal due to an inadequate volume of blood received in culture bottles   Culture   Final    NO GROWTH 4 DAYS Performed at [facility], [address], [place], [place] [address]    Report Status PENDING  Incomplete  Resp panel by RT-PCR (RSV, Flu A&B, Covid) Anterior Nasal Swab     Status: None   Collection Time: 12/28/23  1:12  PM   Specimen: Anterior Nasal Swab  Result Value Ref Range Status   SARS Coronavirus 2 by RT PCR NEGATIVE NEGATIVE Final   Influenza A by PCR NEGATIVE NEGATIVE Final   Influenza B by PCR NEGATIVE NEGATIVE Final    Comment: (NOTE) The Xpert Xpress SARS-CoV-2/FLU/RSV plus assay is intended as an aid in the diagnosis of influenza from Nasopharyngeal swab specimens and should not be used as a sole basis for treatment. Nasal washings and aspirates are unacceptable for Xpert Xpress SARS-CoV-2/FLU/RSV testing.  Fact Sheet for Patients: BloggerCourse.com  Fact Sheet for Healthcare Providers: SeriousBroker.it  This test is not yet approved or cleared by the [place]  FDA and has been authorized for detection and/or diagnosis of SARS-CoV-2 by FDA under an Emergency Use Authorization (EUA). This EUA will remain in effect (meaning this test can be used) for the duration of the COVID-19 declaration under Section 564(b)(1) of the Act, 21 U.S.C. section 360bbb-3(b)(1), unless the authorization is terminated or revoked.     Resp Syncytial Virus  by PCR NEGATIVE NEGATIVE Final    Comment: (NOTE) Fact Sheet for Patients: BloggerCourse.com  Fact Sheet for Healthcare Providers: SeriousBroker.it  This test is not yet approved or cleared by the [place]  FDA and has been authorized for detection and/or diagnosis of SARS-CoV-2 by FDA under an Emergency Use Authorization (EUA). This EUA will remain in effect (meaning this test can be used) for the duration of the COVID-19 declaration under Section 564(b)(1) of the Act, 21 U.S.C. section 360bbb-3(b)(1), unless the authorization is terminated or revoked.  Performed at [facility], [address] [place], [place] [address]      Radiology Studies: No results found.    Scheduled Meds:  amLODipine   10 mg Oral Daily   budesonide -glycopyrrolate -formoterol   2 puff Inhalation BID   carvedilol   12.5 mg Oral BID   citalopram   10 mg Oral Daily   feeding supplement (NEPRO CARB STEADY)  237 mL Oral BID BM   heparin   5,000 Units Subcutaneous Q8H   hydrALAZINE   100 mg Oral TID   montelukast   10 mg Oral QHS   polyethylene glycol  17 g Oral Daily   sevelamer  carbonate  800 mg Oral TID WC   sodium bicarbonate   650 mg Oral BID   sodium chloride  flush  3 mL Intravenous Q12H   Continuous Infusions:  furosemide  120 mg (01/01/24 0901)     LOS: 3 days    Time spent:    Sigurd Pac, MD [facility]   01/01/2024, 11:03 AM

## 2024-01-01 NOTE — Evaluation (Signed)
 Physical Therapy Evaluation Patient Details Name: [name] MRN: [id number] DOB: 1933-12-14 Today's Date: 01/01/2024  History of Present Illness  The pt is an 89 yo female presenting 8/15 with SOB, also fell while walking to bathroom and hit her head. Work up revealed acute on chronic CHF exacerbation. PMHx: HTN, HLD, asthma, CKD, T2DM, lupus, GERD, anxiety, CHF, anemia.   Clinical Impression  Pt in bed upon arrival of PT, agreeable to evaluation at this time. Prior to admission the pt reports she was independent with mobility without DME and without falls, reports living with granddaughter who works but having other family around who could assist as needed. The pt required min-modA to complete bed mobility, but min-CGA to complete sit-stand transfers and short-distance mobility in the room. Pt self-limiting to ~18 ft with use of RW due to fatigue, completed with SpO2 94% on 1L O2. The pt reports more fatigue and SOB with mobility than is baseline at home. Will continue to benefit from skilled PT acutely to maintain strength, mobility, and endurance as it aligns with the pt's goals of care. Would recommend RW at d/c for improved stability and endurance with mobility after d/c, per pt and last admission pt already has BSC and WC available.     If plan is discharge home, recommend the following: A little help with walking and/or transfers;A little help with bathing/dressing/bathroom;Assistance with cooking/housework;Assist for transportation;Supervision due to cognitive status;Direct supervision/assist for medications management;Direct supervision/assist for financial management;Help with stairs or ramp for entrance   Can travel by private vehicle        Equipment Recommendations Rolling walker (2 wheels)  Recommendations for Other Services       Functional Status Assessment Patient has had a recent decline in their functional status and/or demonstrates limited ability to make significant  improvements in function in a reasonable and predictable amount of time     Precautions / Restrictions Precautions Precautions: Fall Recall of Precautions/Restrictions: Intact Restrictions Weight Bearing Restrictions Per Provider Order: No      Mobility  Bed Mobility Overal bed mobility: Needs Assistance Bed Mobility: Supine to Sit     Supine to sit: Mod assist     General bed mobility comments: modA to elevate trunk in bed, minA to scoot to EOB    Transfers Overall transfer level: Needs assistance Equipment used: 1 person hand held assist, Rolling walker (2 wheels) Transfers: Sit to/from Stand, Bed to chair/wheelchair/BSC Sit to Stand: Min assist, Contact guard assist   Step pivot transfers: Contact guard assist       General transfer comment: minA without DME, CGA with use of RW. cues for hand placement    Ambulation/Gait Ambulation/Gait assistance: Contact guard assist Gait Distance (Feet): 18 Feet Assistive device: Rolling walker (2 wheels) Gait Pattern/deviations: Step-through pattern, Decreased stride length Gait velocity: decreased Gait velocity interpretation: <1.31 ft/sec, indicative of household ambulator   General Gait Details: cues for proximity to RW and breathing, 94% on 1L O2     Balance Overall balance assessment: Needs assistance Sitting-balance support: No upper extremity supported, Feet supported Sitting balance-Leahy Scale: Fair     Standing balance support: No upper extremity supported, Bilateral upper extremity supported, Reliant on assistive device for balance Standing balance-Leahy Scale: Fair Standing balance comment: Benefits from use of RW                             Pertinent Vitals/Pain Pain Assessment Pain Assessment: No/denies  pain    Home Living Family/patient expects to be discharged to:: Private residence Living Arrangements: Other relatives Engineer, building services) Available Help at Discharge: Family;Available  PRN/intermittently (grandaughter works, has Therapist, art and son available) Type of Home: Apartment Home Access: Level entry       Home Layout: One level Home Equipment: Cane - single point;Shower seat;Wheelchair - manual;BSC/3in1      Prior Function Prior Level of Function : Independent/Modified Independent             Mobility Comments: mod I with cane ADLs Comments: granddaughter does IADLs     Extremity/Trunk Assessment   Upper Extremity Assessment Upper Extremity Assessment: Generalized weakness    Lower Extremity Assessment Lower Extremity Assessment: Generalized weakness    Cervical / Trunk Assessment Cervical / Trunk Assessment: Kyphotic  Communication   Communication Communication: No apparent difficulties    Cognition Arousal: Alert Behavior During Therapy: Flat affect   PT - Cognitive impairments: No family/caregiver present to determine baseline, Memory, Problem solving, Safety/Judgement                       PT - Cognition Comments: pt able to answer questions appropriately in session but seems to have poor understanding of hospice conversations with palliative provider. as when asked if she understood the pt responded only do what you have to do. Following commands: Intact       Cueing Cueing Techniques: Verbal cues     General Comments General comments (skin integrity, edema, etc.): SpO2 100% on 2L at rest upon arrival, to 91% at rest on RA, placed back on 1L which pt tolerated at 94% with gait, but reports SOB so returned to 2L    Exercises     Assessment/Plan    PT Assessment Patient needs continued PT services  PT Problem List Decreased balance;Decreased cognition;Decreased strength;Decreased activity tolerance;Decreased knowledge of use of DME;Decreased safety awareness       PT Treatment Interventions DME instruction;Gait training;Stair training;Functional mobility training;Therapeutic exercise;Therapeutic activities;Cognitive  remediation;Patient/family education    PT Goals (Current goals can be found in the Care Plan section)  Acute Rehab PT Goals Patient Stated Goal: return home PT Goal Formulation: With patient Time For Goal Achievement: 01/15/24 Potential to Achieve Goals: Good    Frequency Min 1X/week        AM-PAC PT 6 Clicks Mobility  Outcome Measure Help needed turning from your back to your side while in a flat bed without using bedrails?: A Little Help needed moving from lying on your back to sitting on the side of a flat bed without using bedrails?: A Little Help needed moving to and from a bed to a chair (including a wheelchair)?: A Little Help needed standing up from a chair using your arms (e.g., wheelchair or bedside chair)?: A Little Help needed to walk in hospital room?: A Little Help needed climbing 3-5 steps with a railing? : A Lot 6 Click Score: 17    End of Session Equipment Utilized During Treatment: Gait belt;Oxygen Activity Tolerance: Patient tolerated treatment well Patient left: in chair;with call bell/phone within reach;with chair alarm set Nurse Communication: Mobility status PT Visit Diagnosis: Other abnormalities of gait and mobility (R26.89);Muscle weakness (generalized) (M62.81)    Time: 8863-8794 PT Time Calculation (min) (ACUTE ONLY): 29 min   Charges:   PT Evaluation $PT Eval Low Complexity: 1 Low PT Treatments $Therapeutic Exercise: 8-22 mins PT General Charges $$ ACUTE PT VISIT: 1 Visit  Izetta Call, PT, DPT   Acute Rehabilitation Department Office [phone] Secure Chat Communication Preferred  Izetta JULIANNA Call 01/01/2024, 1:10 PM

## 2024-01-01 NOTE — Progress Notes (Signed)
   Palliative Medicine Inpatient Follow Up Note HPI: 89 y.o. female with past medical history of CKD stage V, hypertension, asthma, anemia of chronic disease, chronic combined systolic and diastolic CHF, anxiety, GERD, hypertension, and hyperlipidemia. Admitted in the setting of dyspnea. PMT has been consulted to assist with goals of care conversation.   Today's Discussion 01/01/2024  *Please note that this is a verbal dictation therefore any spelling or grammatical errors are due to the Dragon Medical One system interpretation.  Chart reviewed inclusive of vital signs, progress notes, laboratory results, and diagnostic images.   I met with [name] at bedside this afternoon. She is resting comfortably in NAD. She is in good spirits and has just worked with the physical therapy team. She from a mobility perspective did well per PT, Katie.   I reviewed with [name] the concern(s) presently in the setting of hr heart failure. We reviewed that she has severe disease in the setting of her heart failure and CKD 5. I shared that ideally the body would respond to diuresis but in her care with notable dysfunction she does not appear to be responding to treatments.   Created space and opportunity for patient to explore thoughts feelings and fears regarding current medical situation. [name] is understanding of what needs to be done.   I spoke with patients granddaughter, [name] this late afternoon. She and I reviewed the conversation that she had with Dr. Fairy. Discussed [name]' advanced HF and lack of response to diuresis. Reviewed worsening kidney function.  [name] shares that she is agreeable to hospice but would like to learn more about what home services would look like. She shares an interested in [facility] and asked if they can reach out to her after 4:30 this afternoon. We discussed that I would follow up with the [facility] team regarding this request.   Questions and concerns addressed/Palliative  Support Provided.   Objective Assessment: Vital Signs Vitals:   01/01/24 0725 01/01/24 1100  BP:  (!) 132/53  Pulse:  62  Resp:  17  Temp:  98 F (36.7 C)  SpO2: 99% 98%    Intake/Output Summary (Last 24 hours) at 01/01/2024 1150 Last data filed at 01/01/2024 0715 Gross per 24 hour  Intake 243 ml  Output 500 ml  Net -257 ml   Last Weight  Most recent update: 01/01/2024  4:12 AM    Weight  67.9 kg (149 lb 11.1 oz)            Gen:  Elderly chronically ill appearing AA F HEENT: moist mucous membranes CV: Regular rate and rhythm  PULM:  On 1LPM [place], breathing is even and nonlabored ABD: soft/nontender  EXT: (+) BLE edema  Neuro: Alert and oriented x2  SUMMARY OF RECOMMENDATIONS   DNAR/DNI  Patients granddaughter, [name] is interested in learning more about home hospice services --> Have alerted TOC and referral has been made to [facility]  The PMT will continue to follow along and offer ongoing support _____________________________________________________________________________________ [name] [place] Palliative Medicine Team Team Cell Phone: [phone] Please utilize secure chat with additional questions, if there is no response within 30 minutes please call the above phone number  Billing based on MDM: High  Palliative Medicine Team providers are available by phone from 7am to 7pm daily and can be reached through the team cell phone.  Should this patient require assistance outside of these hours, please call the patient's attending physician.

## 2024-01-01 NOTE — Plan of Care (Signed)
  Problem: Education: Goal: Knowledge of General Education information will improve Description: Including pain rating scale, medication(s)/side effects and non-pharmacologic comfort measures Outcome: Progressing   Problem: Clinical Measurements: Goal: Ability to maintain clinical measurements within normal limits will improve Outcome: Progressing   Problem: Clinical Measurements: Goal: Respiratory complications will improve Outcome: Progressing   Problem: Clinical Measurements: Goal: Cardiovascular complication will be avoided Outcome: Progressing   Problem: Coping: Goal: Level of anxiety will decrease Outcome: Progressing   Problem: Elimination: Goal: Will not experience complications related to bowel motility Outcome: Progressing

## 2024-01-02 DIAGNOSIS — N185 Chronic kidney disease, stage 5: Secondary | ICD-10-CM | POA: Diagnosis not present

## 2024-01-02 DIAGNOSIS — I5033 Acute on chronic diastolic (congestive) heart failure: Secondary | ICD-10-CM

## 2024-01-02 DIAGNOSIS — F411 Generalized anxiety disorder: Secondary | ICD-10-CM | POA: Diagnosis not present

## 2024-01-02 DIAGNOSIS — Z86718 Personal history of other venous thrombosis and embolism: Secondary | ICD-10-CM

## 2024-01-02 DIAGNOSIS — Z515 Encounter for palliative care: Secondary | ICD-10-CM | POA: Diagnosis not present

## 2024-01-02 DIAGNOSIS — I10 Essential (primary) hypertension: Secondary | ICD-10-CM | POA: Diagnosis not present

## 2024-01-02 DIAGNOSIS — Z7189 Other specified counseling: Secondary | ICD-10-CM | POA: Diagnosis not present

## 2024-01-02 LAB — CULTURE, BLOOD (SINGLE): Culture: NO GROWTH

## 2024-01-02 LAB — BASIC METABOLIC PANEL WITH GFR
Anion gap: 12 (ref 5–15)
BUN: 78 mg/dL — ABNORMAL HIGH (ref 8–23)
CO2: 21 mmol/L — ABNORMAL LOW (ref 22–32)
Calcium: 8 mg/dL — ABNORMAL LOW (ref 8.9–10.3)
Chloride: 104 mmol/L (ref 98–111)
Creatinine, Ser: 6.37 mg/dL — ABNORMAL HIGH (ref 0.44–1.00)
GFR, Estimated: 6 mL/min — ABNORMAL LOW (ref >60)
Glucose, Bld: 104 mg/dL — ABNORMAL HIGH (ref 70–99)
Potassium: 5 mmol/L (ref 3.5–5.1)
Sodium: 137 mmol/L (ref 135–145)

## 2024-01-02 NOTE — Assessment & Plan Note (Addendum)
 Hyperkalemia, azotemia/ not uremia   Worsening renal function with serum cr at 6,43 with K at 4.9 and serum bicarbonate at 22 BUN 78 Na 137 P 6.7   Continue palliative diuresis with oral torsemide   Follow up with palliative care services.  Patient is not candidate for renal replacement therapy due to heart failure, deconditioning and frailty   Continue with oral sodium bicarbonate    Anemia of chronic renal disease.   Metabolic bone disease, continue with sevelamer .

## 2024-01-02 NOTE — Progress Notes (Signed)
  Progress Note   Patient: [name] FMW:[id number] DOB: 05-Mar-1934 DOA: 12/28/2023     4 DOS: the patient was seen and examined on 01/02/2024   Brief hospital course: 89 y.o. female with past medical history of CKD stage V, hypertension, asthma, anemia of chronic disease, chronic combined systolic and diastolic CHF, anxiety, GERD, hypertension, and hyperlipidemia admitted on 12/28/2023 with shortness of breath and a fall. Increased dyspnea last 1 to 2 days, recently discharged after hospitalization for same, CKD 5 followed by nephrology and palliative care, not a dialysis candidate .hypoxic in the ED, placed on O2 patient has had 5 hospitalizations and 1 ED visit in the last 6 months. Patient and family are known to PMT service. PMT has been consulted to assist with goals of care conversation.   Assessment and Plan: * Acute on chronic diastolic CHF (congestive heart failure) (HCC) Echocardiogram with mildly reduced LV systolic function 45 to 50%, global hypokinesis, mild dilatated LV cavity, severe LVH, RV systolic function preserved, LA and RA with moderate dilatation, mild mitral valve regurgitation,   Urine output 1200 ml Systolic blood pressure 138 mmHg range  Plan to continue palliative diuresis.  Limited medical therapy due to reduced GFR.  Follow up with palliative care recommendations.  Continue with oral sodium bicarbonate    Anemia of chronic renal disease.   Metabolic bone disease, continue with sevelamer .   HTN (hypertension) Continue blood pressure control with amlodipine , carvedilol  and hydralazine    CKD (chronic kidney disease) stage 5, GFR less than 15 ml/min (HCC) Hyperkalemia, azotemia/ uremia   Renal function with serum cr at 6,37 with BUN 78  Na 137   Plan to continue palliative diuresis with IV furosemide .  Follow up with palliative care services.  Patient is not candidate for renal replacement therapy due to heart failure, deconditioning and frailty    Anxiety state As needed lorazepam .  And continue citalopram .   History of DVT (deep vein thrombosis) Not on full anticoagulation   Mild cognitive impairment Supportive medical care.         Subjective: Patient with no chest pain or dyspnea, very weak and deconditioned,  Physical Exam: Vitals:   01/02/24 0500 01/02/24 0715 01/02/24 0749 01/02/24 1155  BP:  (!) 138/51  (!) 128/48  Pulse:  74  (!) 57  Resp:  17  17  Temp:  98 F (36.7 C)  97.7 F (36.5 C)  TempSrc:  Oral  Oral  SpO2:  99% 98% 100%  Weight: 68.1 kg      Neurology awake and alert, deconditioned and ill looking appearing ENT with mild pallor with no icterus Cardiovascular with S1 and S2 present and regular with no gallops, or rubs  Respiratory with no wheezing, positive bilateral rales, with no rhonchi  Abdomen with no distention  Positive lower extremity edema +++ pitting   Data Reviewed:    Family Communication: I spoke with patient's daughters at the bedside, we talked in detail about patient's condition, plan of care and prognosis and all questions were addressed.   Disposition: Status is: Inpatient Remains inpatient appropriate because: pending placement/ possible hospice.    Planned Discharge Destination: Home     Author: Elidia Toribio Furnace, MD 01/02/2024 3:06 PM  For on call review [email or web address].

## 2024-01-02 NOTE — Assessment & Plan Note (Addendum)
 Continue mirtazapine , citalopram  and as needed lorazepam .

## 2024-01-02 NOTE — Progress Notes (Signed)
   Palliative Medicine Inpatient Follow Up Note HPI: 89 y.o. female with past medical history of CKD stage V, hypertension, asthma, anemia of chronic disease, chronic combined systolic and diastolic CHF, anxiety, GERD, hypertension, and hyperlipidemia. Admitted in the setting of dyspnea. PMT has been consulted to assist with goals of care conversation.   Today's Discussion 01/02/2024  *Please note that this is a verbal dictation therefore any spelling or grammatical errors are due to the Dragon Medical One system interpretation.  Chart reviewed inclusive of vital signs, progress notes, laboratory results, and diagnostic images.   I met with [name] at bedside this afternoon, she is sitting up. She shares she has some shortness of breath but overall is feeling better. She is in agreement with proceeding with the care her granddaughter, [name] feels is best for her at this juncture. We did again discuss the idea of hospice which would enable [name] to stay in her home and for her to have ongoing symptom support. We reviewed overall the hope that this would enhance her quality of life. She shares she would be fine with that.  I spoke with [name] this afternoon. We discussed that she is interested in hospice and is planning to speak to [facility] this afternoon versus first thing in the morning. We discussed patient present clinical state and lack of improvement. [name] shares that she knows [name] wants to get home and she plans to make that happen. We discussed once hospice is set up then transition home often takes place shortly thereafter.   Questions and concerns addressed/Palliative Support Provided.   Objective Assessment: Vital Signs Vitals:   01/02/24 0749 01/02/24 1155  BP:  (!) 128/48  Pulse:  (!) 57  Resp:  17  Temp:  97.7 F (36.5 C)  SpO2: 98% 100%    Intake/Output Summary (Last 24 hours) at 01/02/2024 1411 Last data filed at 01/02/2024 1318 Gross per 24 hour  Intake 243 ml   Output 1100 ml  Net -857 ml   Last Weight  Most recent update: 01/02/2024  5:16 AM    Weight  68.1 kg (150 lb 2.1 oz)            Gen:  Elderly chronically ill appearing AA F HEENT: moist mucous membranes CV: Regular rate and rhythm  PULM:  On 2LPM [place], breathing is even and nonlabored ABD: soft/nontender  EXT: (+) BLE edema  Neuro: Alert and oriented x2  SUMMARY OF RECOMMENDATIONS   DNAR/DNI  Patients granddaughter, [name] is interested in learning more about home hospice services --> Have alerted TOC and referral has been made to [facility]  The PMT will continue to follow along and offer ongoing support _____________________________________________________________________________________ [name] [facility] Palliative Medicine Team Team Cell Phone: [phone] Please utilize secure chat with additional questions, if there is no response within 30 minutes please call the above phone number  Billing based on FIF:Fnizmjuz  Palliative Medicine Team providers are available by phone from 7am to 7pm daily and can be reached through the team cell phone.  Should this patient require assistance outside of these hours, please call the patient's attending physician.

## 2024-01-02 NOTE — Progress Notes (Signed)
 Mobility Specialist Progress Note:    01/02/24 0936  Mobility  Activity Ambulated with assistance;Stood at bedside  Level of Assistance Minimal assist, patient does 75% or more  Assistive Device Front wheel walker  Distance Ambulated (ft) 10 ft  Activity Response Tolerated fair  Mobility Referral Yes  Mobility visit 1 Mobility  Mobility Specialist Start Time (ACUTE ONLY) [id number]  Mobility Specialist Stop Time (ACUTE ONLY) 1001  Mobility Specialist Time Calculation (min) (ACUTE ONLY) 25 min   Received pt sitting in chair. Pt pleasant and agreeable to session this morning. Pt performed 2 sts for me before taking a small stroll through her room. C/o some slight fatigue but otherwise was doing well. Left pt in recliner w/ all needs met. Notified RN  Venetia Keel Mobility Specialist Please Contact via SecureChat or Rehab Office at [phone]

## 2024-01-02 NOTE — Assessment & Plan Note (Addendum)
 Echocardiogram with mildly reduced LV systolic function 45 to 50%, global hypokinesis, mild dilatated LV cavity, severe LVH, RV systolic function preserved, LA and RA with moderate dilatation, mild mitral valve regurgitation,   Patient not responded to IV diuresis with furosemide , decision was made to continue care under hospice services.  Palliative po diuresis with torsemide  80 mg bid   Limited medical therapy due to reduced GFR.  Follow up with hospice services.

## 2024-01-02 NOTE — Assessment & Plan Note (Signed)
 Not on full anticoagulation

## 2024-01-02 NOTE — Hospital Course (Addendum)
 [name] was admitted to the hospital with the working diagnosis of volume overload due to worsening renal failure  89 y.o. female with past medical history of CKD stage V, hypertension, asthma, anemia of chronic disease, heart failure, anxiety, GERD, hypertension, and hyperlipidemia who presented with dyspnea and a mechanical fall. Recent hospitalization 07/20 to 12/10/23 for volume overload in the setting of advance renal disease and heart failure. She was discharged on torsemide  40 mg daily. Decision was made not to undertake renal replacement therapy.  This time reported worsening dyspnea for the last 2 days prior to presentation. On day of hospitalization she sustained a mechanical fall while using the bathroom. EMS was called and she was found with 02 saturation of 87% on room air.   In the ED her blood pressure was 186/72, HR 86, RR 31 and 02 saturation 94% on supplemental 02 per [place]  Lungs with bilateral rales, heart with S1 and S2 present and regular with no gallops or rubs, abdomen with no distention and positive lower extremity edema.   Na 138, K 5,6 Cl 112 bicarbonate 18 glucose 119 bun 81 cr 6.0  AST 18 ALT < 5 BNP 1,010  High sensitive troponin 109 and 97 Wbc 7,4 hgb 8,7 plt 246   Sars covid 19 negative Influenza negative RSV negative    CT head maxillofacial with no acute intracranial abnormalities.  Left parietal scalp hematoma   Chest radiograph with cardiomegaly with bilateral upper lobes interstitial infiltrates.  No effusions.   EKG 96 bpm, normal axis, normal intervals, qtc 462, sinus rhythm with q wave V1 and V2, with no significant ST segment or T wave changes. Positive LVH.   Patient was placed on palliative diuresis   08/21 pending family meeting with hospice. Possible home  hospice services.  08/22 patient with no significant improvement in volume status despite aggressive IV diuresis. Her family has decided to continue care  under home hospice services.   08/23 patient stable for transfer to home with home hospice services.

## 2024-01-02 NOTE — Assessment & Plan Note (Signed)
 Supportive medical care.

## 2024-01-02 NOTE — Assessment & Plan Note (Signed)
 Continue blood pressure control with amlodipine, carvedilol and hydralazine

## 2024-01-03 DIAGNOSIS — I10 Essential (primary) hypertension: Secondary | ICD-10-CM | POA: Diagnosis not present

## 2024-01-03 DIAGNOSIS — I5033 Acute on chronic diastolic (congestive) heart failure: Secondary | ICD-10-CM | POA: Diagnosis not present

## 2024-01-03 DIAGNOSIS — Z86718 Personal history of other venous thrombosis and embolism: Secondary | ICD-10-CM | POA: Diagnosis not present

## 2024-01-03 DIAGNOSIS — N185 Chronic kidney disease, stage 5: Secondary | ICD-10-CM | POA: Diagnosis not present

## 2024-01-03 LAB — RENAL FUNCTION PANEL
Albumin: 2 g/dL — ABNORMAL LOW (ref 3.5–5.0)
Anion gap: 12 (ref 5–15)
BUN: 78 mg/dL — ABNORMAL HIGH (ref 8–23)
CO2: 22 mmol/L (ref 22–32)
Calcium: 8.2 mg/dL — ABNORMAL LOW (ref 8.9–10.3)
Chloride: 103 mmol/L (ref 98–111)
Creatinine, Ser: 6.43 mg/dL — ABNORMAL HIGH (ref 0.44–1.00)
GFR, Estimated: 6 mL/min — ABNORMAL LOW (ref >60)
Glucose, Bld: 98 mg/dL (ref 70–99)
Phosphorus: 6.7 mg/dL — ABNORMAL HIGH (ref 2.5–4.6)
Potassium: 4.9 mmol/L (ref 3.5–5.1)
Sodium: 137 mmol/L (ref 135–145)

## 2024-01-03 MED ORDER — TORSEMIDE 20 MG PO TABS
80.0000 mg | ORAL_TABLET | Freq: Two times a day (BID) | ORAL | Status: DC
  Administered 2024-01-03 – 2024-01-05 (×4): 80 mg via ORAL
  Filled 2024-01-03 (×4): qty 4

## 2024-01-03 NOTE — Progress Notes (Signed)
 Mobility Specialist Progress Note:    01/03/24 1410  Mobility  Activity Pivoted/transferred from bed to chair  Level of Assistance Minimal assist, patient does 75% or more  Assistive Device Other (Comment) (HHA)  Distance Ambulated (ft) 3 ft  Activity Response Tolerated well  Mobility Referral Yes  Mobility visit 1 Mobility  Mobility Specialist Start Time (ACUTE ONLY) 1410  Mobility Specialist Stop Time (ACUTE ONLY) 1425  Mobility Specialist Time Calculation (min) (ACUTE ONLY) 15 min   Pt agreeable to session. Pt needing minA to scoot to EOB and CGA to stand and transfer. No c/o any symptoms. Left pt in recliner w/ all needs met and daughter in room.   Venetia Keel Mobility Specialist Please Neurosurgeon or Rehab Office at [phone]

## 2024-01-03 NOTE — Progress Notes (Signed)
 Progress Note   Patient: [name] FMW:[id number] DOB: [birth year] DOA: 12/28/2023     5 DOS: the patient was seen and examined on 01/03/2024   Brief hospital course: Mrs. Skarda was admitted to the hospital with the working diagnosis of worsening renal failure.   89 y.o. female with past medical history of CKD stage V, hypertension, asthma, anemia of chronic disease, chronic combined systolic and diastolic CHF, anxiety, GERD, hypertension, and hyperlipidemia admitted on 12/28/2023 with shortness of breath and a fall. Increased dyspnea last 1 to 2 days, recently discharged after hospitalization for same, CKD 5 followed by nephrology and palliative care, not a dialysis candidate .hypoxic in the ED, placed on O2 patient has had 5 hospitalizations and 1 ED visit in the last 6 months. Patient and family are known to PMT service. PMT has been consulted to assist with goals of care conversation.   08/21 pending family meeting with hospice. Possible home  hospice services.   Assessment and Plan: * Acute on chronic diastolic CHF (congestive heart failure) (HCC) Echocardiogram with mildly reduced LV systolic function 45 to 50%, global hypokinesis, mild dilatated LV cavity, severe LVH, RV systolic function preserved, LA and RA with moderate dilatation, mild mitral valve regurgitation,   Continue volume overloaded.  Will change from IV to po diuretic therapy.   Plan to continue palliative diuresis.  Limited medical therapy due to reduced GFR.  Follow up with palliative care recommendations.   HTN (hypertension) Continue blood pressure control with amlodipine , carvedilol  and hydralazine    CKD (chronic kidney disease) stage 5, GFR less than 15 ml/min (HCC) Hyperkalemia, azotemia/ uremia   Worsening renal function with serum cr at 6,43 with K at 4.9 and serum bicarbonate at 22  Na 137 P 6.7   Continue palliative diuresis with oral torsemide   Follow up with palliative care services.   Patient is not candidate for renal replacement therapy due to heart failure, deconditioning and frailty   Continue with oral sodium bicarbonate    Anemia of chronic renal disease.   Metabolic bone disease, continue with sevelamer .   Anxiety state As needed lorazepam .  And continue citalopram .   History of DVT (deep vein thrombosis) Not on full anticoagulation   Mild cognitive impairment Supportive medical care.      Subjective: Patient out of the bed in the chair, positive dyspnea and edema, no chest pain, no nausea or vomiting.   Physical Exam: Vitals:   01/02/24 2348 01/03/24 0433 01/03/24 0728 01/03/24 1036  BP: (!) 138/50 (!) 149/50  (!) 148/50  Pulse: 62 66  67  Resp: 16 18  17   Temp: 98.2 F (36.8 C) 98.1 F (36.7 C)  98 F (36.7 C)  TempSrc: Oral Oral  Oral  SpO2: 98% 100% 100% 100%  Weight:  64.6 kg     Neurology awake and alert, deconditioned and ill looking appearing  ENT with mild pallor with no icterus Cardiovascular with S1 and S2 present and regular, with positive systolic murmur at the right lower sternal border Positive JVD Respiratory with rales bilaterally with no wheezing or rhonchi  Abdomen soft and non tender, not distended Positive lower extremity edema +++  Data Reviewed:    Family Communication: her daughter at the bedside, pending her granddaughter to meet with hospice   Disposition: Status is: Inpatient Remains inpatient appropriate because: pending disposition   Planned Discharge Destination: Home     Author: Elidia Toribio Furnace, MD 01/03/2024 3:25 PM  For on call review [email or web address].

## 2024-01-04 DIAGNOSIS — I10 Essential (primary) hypertension: Secondary | ICD-10-CM | POA: Diagnosis not present

## 2024-01-04 DIAGNOSIS — N185 Chronic kidney disease, stage 5: Secondary | ICD-10-CM | POA: Diagnosis not present

## 2024-01-04 DIAGNOSIS — I5033 Acute on chronic diastolic (congestive) heart failure: Secondary | ICD-10-CM | POA: Diagnosis not present

## 2024-01-04 DIAGNOSIS — F411 Generalized anxiety disorder: Secondary | ICD-10-CM | POA: Diagnosis not present

## 2024-01-04 NOTE — Progress Notes (Signed)
 Physical Therapy Treatment Patient Details Name: [name] MRN: [id number] DOB: 1934/01/28 Today's Date: 01/04/2024   History of Present Illness The pt is an 89 yo female presenting 8/15 with SOB, also fell while walking to bathroom and hit her head. Work up revealed acute on chronic CHF exacerbation. PMHx: HTN, HLD, asthma, CKD, T2DM, lupus, GERD, anxiety, CHF, anemia    PT Comments  Limited session, more encouragement and coaxing than mobility.  Pt appeared throughout to be capable of doing more, but wouldn't initiate or rise to the occasion.  Emphasis on warm up due to stiff joints that suggest minimal movement, assist transitioning to EOB, sitting tolerance EOB, STS with initiation and stability assist with side-stepping to [facility].  Pt unwilling to stay OOB today.  Returned to supine, but left pt around HOB 40-45 degrees.    If plan is discharge home, recommend the following: A little help with walking and/or transfers;A little help with bathing/dressing/bathroom;Assistance with cooking/housework;Assist for transportation;Supervision due to cognitive status;Direct supervision/assist for medications management;Direct supervision/assist for financial management;Help with stairs or ramp for entrance   Can travel by private vehicle        Equipment Recommendations  Rolling walker (2 wheels)    Recommendations for Other Services       Precautions / Restrictions Precautions Precautions: Fall Recall of Precautions/Restrictions: Intact     Mobility  Bed Mobility Overal bed mobility: Needs Assistance Bed Mobility: Supine to Sit, Sit to Supine     Supine to sit: Max assist Sit to supine: Max assist   General bed mobility comments: cued for direction and pt wasn't initiating, so more of max assist until pt started participating.    Transfers Overall transfer level: Needs assistance Equipment used: Rolling walker (2 wheels) Transfers: Sit to/from Stand Sit to Stand: Min  assist, Mod assist           General transfer comment: pt was not actively initiating and was assisted with tactile cues coaxing participation.  side stepping toward HOW 2-3 feet.    Ambulation/Gait Ambulation/Gait assistance: Mod assist Gait Distance (Feet): -1 Feet Assistive device: Rolling walker (2 wheels) Gait Pattern/deviations: Step-to pattern   Gait velocity interpretation: <1.31 ft/sec, indicative of household ambulator   General Gait Details: mod assist stability and moving the RW.  Some w/shift assist, but pt stepped without assist.  Unable to coax her further or to get her to agree to the chair.   Stairs             Wheelchair Mobility     Tilt Bed    Modified Rankin (Stroke Patients Only)       Balance Overall balance assessment: Needs assistance Sitting-balance support: No upper extremity supported, Feet supported Sitting balance-Leahy Scale: Fair       Standing balance-Leahy Scale: Poor Standing balance comment: Benefits from use of RW                            Communication Communication Communication: No apparent difficulties Factors Affecting Communication:  (soft spoken)  Cognition Arousal: Lethargic Behavior During Therapy: Flat affect   PT - Cognitive impairments: No family/caregiver present to determine baseline, Memory, Problem solving, Safety/Judgement                         Following commands: Intact (once more alert)      Cueing Cueing Techniques: Verbal cues  Exercises Other Exercises Other Exercises:  AAROM hips/knees Bil LE's, AAROM UE's during mobility to EOB    General Comments General comments (skin integrity, edema, etc.): VSS likely no need for O2, 98-100% on 2L [place].  Pt never aroused fully, did not initiate any movement and wasn't very participative in general.      Pertinent Vitals/Pain Pain Assessment Pain Assessment: Faces Faces Pain Scale: Hurts little more Pain Location: joints with  ROM Pain Descriptors / Indicators: Grimacing, Guarding, Discomfort Pain Intervention(s): Monitored during session    Home Living                          Prior Function            PT Goals (current goals can now be found in the care plan section) Acute Rehab PT Goals Patient Stated Goal: return home PT Goal Formulation: With patient Time For Goal Achievement: 01/15/24 Potential to Achieve Goals: Fair    Frequency    Min 1X/week      PT Plan      Co-evaluation              AM-PAC PT 6 Clicks Mobility   Outcome Measure  Help needed turning from your back to your side while in a flat bed without using bedrails?: A Lot Help needed moving from lying on your back to sitting on the side of a flat bed without using bedrails?: A Lot Help needed moving to and from a bed to a chair (including a wheelchair)?: A Lot Help needed standing up from a chair using your arms (e.g., wheelchair or bedside chair)?: A Lot Help needed to walk in hospital room?: A Lot Help needed climbing 3-5 steps with a railing? : A Lot 6 Click Score: 12    End of Session Equipment Utilized During Treatment: Oxygen Activity Tolerance: Patient tolerated treatment well (was not interested in participating due to pain) Patient left: in bed;with bed alarm set;with call bell/phone within reach Nurse Communication: Mobility status PT Visit Diagnosis: Other abnormalities of gait and mobility (R26.89);Pain Pain - part of body: Knee;Hip (bil)     Time: 1017-1040 PT Time Calculation (min) (ACUTE ONLY): 23 min  Charges:    $Therapeutic Exercise: 8-22 mins $Therapeutic Activity: 8-22 mins PT General Charges $$ ACUTE PT VISIT: 1 Visit                     01/04/2024  India HERO., PT Acute Rehabilitation Services [phone]  (office)   Vinie GAILS Cherise Fedder 01/04/2024, 11:00 AM

## 2024-01-04 NOTE — TOC Progression Note (Signed)
 Transition of Care [facility] - Progression Note    Patient Details  Name: [name] MRN: [id number] Date of Birth: [birth year]  [facility] CM/SW Contact  [place] Barnie Rama, RN Phone Number: 01/04/2024, 2:58 PM  Clinical Narrative:    NCM notified Burnard with [facility] that patient will be going home with hospice by private vehicle. Plan for dc tomorrow, the oxygen has to be delivered.     Expected Discharge Plan: Home w Home Health Services Barriers to Discharge: Continued Medical Work up               Expected Discharge Plan and Services   Discharge Planning Services: CM Consult Post Acute Care Choice: Home Health Living arrangements for the past 2 months: Single Family Home                   DME Agency: NA       HH Arranged: RN, Disease Management, PT, OT HH Agency: [facility] Date HH Agency Contacted: 12/31/23 Time HH Agency Contacted: 1630 Representative spoke with at [facility] Agency: Burnard   Social Drivers of Health (SDOH) Interventions SDOH Screenings   Food Insecurity: No Food Insecurity (12/28/2023)  Housing: Low Risk  (12/28/2023)  Transportation Needs: No Transportation Needs (12/28/2023)  Utilities: Not At Risk (12/28/2023)  Alcohol  Screen: Low Risk  (09/21/2023)  Depression (PHQ2-9): Low Risk  (09/21/2023)  Financial Resource Strain: Low Risk  (09/21/2023)  Physical Activity: Sufficiently Active (09/21/2023)  Social Connections: Moderately Isolated (12/28/2023)  Stress: No Stress Concern Present (09/21/2023)  Tobacco Use: Low Risk  (12/28/2023)  Health Literacy: Adequate Health Literacy (09/21/2023)    Readmission Risk Interventions    12/31/2023    4:26 PM 12/03/2023   11:27 AM 10/24/2023    2:20 PM  Readmission Risk Prevention Plan  Transportation Screening Complete Complete Complete  Medication Review Oceanographer) Complete Complete Complete  PCP or Specialist appointment within 3-5 days of discharge  Complete Complete   HRI or Home Care Consult Complete Complete Complete  SW Recovery Care/Counseling Consult   Complete  Palliative Care Screening Complete Not Applicable   Skilled Nursing Facility Not Applicable Not Applicable Complete

## 2024-01-04 NOTE — Progress Notes (Signed)
 [name] 3E01- [facility] Liaison Note  Received request from Barnie Birmingham, RN, Transitions of Care Manager, for hospice services at home after discharge. Spoke with patients granddaughter, [name], to initiate education related to hospice philosophy, services, and team approach to care. Patient/family verbalized understanding of information given. Per discussion, the plan is for discharge home by private vehicle.  DME needs discussed. Patient has the following equipment in the home:  bedside commode, rollator walker, standard walker, shower chair.  Patient/family requests the following equipment for delivery:   5L Oxygen concentrator  The address has been verified and is correct in the chart. Keasha Malkiewicz [phone] is the family contact to arrange time of equipment delivery.    Please send signed and completed DNR home with patient/family. Please provide prescriptions at discharge as needed to ensure ongoing symptom management.  [facility] information and contact numbers given to [facility].  Above information shared with Barnie Birmingham, RN, Transitions of Care Manager.  Please call with any questions or concerns.  Thank you for the opportunity to participate in this patient's care.    Daphne Shed, LPN [facility] Liaison [phone]

## 2024-01-04 NOTE — TOC Progression Note (Signed)
 Transition of Care [facility]) - Progression Note    Patient Details  Name: [name] MRN: [id number] Date of Birth: [birth year]  Transition of Care [facility]) CM/SW Contact  Waddell Barnie Rama, RN Phone Number: 01/04/2024, 2:49 PM  Clinical Narrative:    Daphne with [facility] has spoke with [facility] and per Daphne she has decide on Hospice at Home with [facility].     Expected Discharge Plan: Home w Home Health Services Barriers to Discharge: Continued Medical Work up               Expected Discharge Plan and Services   Discharge Planning Services: CM Consult Post Acute Care Choice: Home Health Living arrangements for the past 2 months: Single Family Home                   DME Agency: NA       HH Arranged: RN, Disease Management, PT, OT HH Agency: [facility] Date HH Agency Contacted: 12/31/23 Time HH Agency Contacted: 1630 Representative spoke with at [facility] Agency: [place]   Social Drivers of Health (SDOH) Interventions SDOH Screenings   Food Insecurity: No Food Insecurity (12/28/2023)  Housing: Low Risk  (12/28/2023)  Transportation Needs: No Transportation Needs (12/28/2023)  Utilities: Not At Risk (12/28/2023)  Alcohol  Screen: Low Risk  (09/21/2023)  Depression (PHQ2-9): Low Risk  (09/21/2023)  Financial Resource Strain: Low Risk  (09/21/2023)  Physical Activity: Sufficiently Active (09/21/2023)  Social Connections: Moderately Isolated (12/28/2023)  Stress: No Stress Concern Present (09/21/2023)  Tobacco Use: Low Risk  (12/28/2023)  Health Literacy: Adequate Health Literacy (09/21/2023)    Readmission Risk Interventions    12/31/2023    4:26 PM 12/03/2023   11:27 AM 10/24/2023    2:20 PM  Readmission Risk Prevention Plan  Transportation Screening Complete Complete Complete  Medication Review Oceanographer) Complete Complete Complete  PCP or Specialist appointment within 3-5 days of discharge  Complete Complete  HRI or Home Care Consult Complete  Complete Complete  SW Recovery Care/Counseling Consult   Complete  Palliative Care Screening Complete Not Applicable   Skilled Nursing Facility Not Applicable Not Applicable Complete

## 2024-01-04 NOTE — Progress Notes (Signed)
 Progress Note   Patient: [name] FMW:[id number] DOB: [birth year] DOA: 12/28/2023     6 DOS: the patient was seen and examined on 01/04/2024   Brief hospital course: Mrs. Waxman was admitted to the hospital with the working diagnosis of worsening renal failure.   89 y.o. female with past medical history of CKD stage V, hypertension, asthma, anemia of chronic disease, heart failure, anxiety, GERD, hypertension, and hyperlipidemia who presented with dyspnea and mechanical fall. Recent hospitalization 07/20 to 12/10/23 for volume overload in the setting of advance renal disease and heart failure. She was discharge on torsemide  40 mg daily. Decision was made not to undertake renal replacement therapy.  Reported worsening dyspnea for the last 2 days prior to presentation. On day of hospitalization she sustained a mechanical fall while using the bathroom. EMS was called and she was found with 02 saturation of 87% on room air.   In the ED her blood pressure was 186/72, HR 86, RR 31 and 02 saturation 94% on supplemental 02 per [place]  Lungs with bilateral rales, heart with S1 and S2 present and regular with no gallops or rubs, abdomen with no distention and positive lower extremity edema.   Na 138, K 5,6 Cl 112 bicarbonate 18 glucose 119 bun 81 cr 6.0  AST 18 ALT < 5 BNP 1,010  High sensitive troponin 109 and 97 Wbc 7,4 hgb 8,7 plt 246   Sars covid 19 negative Influenza negative RSV negative    CT head maxillofacial with no acute intracranial abnormalities.  Left parietal scalp hematoma   Chest radiograph with cardiomegaly with bilateral upper lobes interstitial infiltrates.  No effusions.   EKG 96 bpm, normal axis, normal intervals, qtc 462, sinus rhythm with q wave V1 and V2, with no significant ST segment or T wave changes. Positive LVH.   Patient was placed on palliative diuresis   08/21 pending family meeting with hospice. Possible home  hospice services.  08/22 patient with no  significant improvement in volume status despite aggressive IV diuresis. Her family has decided to continue care  under home hospice services.   Assessment and Plan: * Acute on chronic diastolic CHF (congestive heart failure) (HCC) Echocardiogram with mildly reduced LV systolic function 45 to 50%, global hypokinesis, mild dilatated LV cavity, severe LVH, RV systolic function preserved, LA and RA with moderate dilatation, mild mitral valve regurgitation,   Continue volume overloaded.  Palliative po diuresis with torsemide  80 mg bid   Plan to continue palliative diuresis.  Limited medical therapy due to reduced GFR.  Follow up with hospice services.   HTN (hypertension) Continue blood pressure control with amlodipine , carvedilol  and hydralazine    CKD (chronic kidney disease) stage 5, GFR less than 15 ml/min (HCC) Hyperkalemia, azotemia/ uremia   Worsening renal function with serum cr at 6,43 with K at 4.9 and serum bicarbonate at 22  Na 137 P 6.7   Continue palliative diuresis with oral torsemide   Follow up with palliative care services.  Patient is not candidate for renal replacement therapy due to heart failure, deconditioning and frailty   Continue with oral sodium bicarbonate    Anemia of chronic renal disease.   Metabolic bone disease, continue with sevelamer .   Anxiety state As needed lorazepam .  And continue citalopram .   History of DVT (deep vein thrombosis) Not on full anticoagulation   Mild cognitive impairment Supportive medical care.       Subjective: Patient with no chest pain, continue to have dyspnea and edema, no  palpitations, no nausea or vomiting   Physical Exam: Vitals:   01/04/24 0720 01/04/24 0725 01/04/24 0811 01/04/24 1210  BP: (!) 145/51   (!) 118/42  Pulse: 68  68 (!) 53  Resp: 17 18  17   Temp: 98.2 F (36.8 C)     TempSrc: Oral     SpO2: 99%  99% 99%  Weight:       Neurology awake and alert ENT with mild pallor Cardiovascular with S1  and S2 present and regular, positive systolic murmur at the right lower sternal border, no rubs or gallops. Respiratory with bilateral rales with no wheezing or rhonchi  Abdomen with no distention  Positive lower extremity edema ++  Data Reviewed:    Family Communication: no family at the bedside   Disposition: Status is: Inpatient Remains inpatient appropriate because: pending transfer to home hospice   Planned Discharge Destination: Home     Author: Elidia Toribio Furnace, MD 01/04/2024 3:42 PM  For on call review [email or web address].

## 2024-01-04 NOTE — Progress Notes (Signed)
 Mobility Specialist Progress Note:    01/04/24 1139  Mobility  Activity Ambulated with assistance;Pivoted/transferred from bed to chair  Level of Assistance Minimal assist, patient does 75% or more  Assistive Device Other (Comment) (HHA)  Distance Ambulated (ft) 3 ft  Activity Response Tolerated well  Mobility Referral Yes  Mobility visit 1 Mobility  Mobility Specialist Start Time (ACUTE ONLY) 0850  Mobility Specialist Stop Time (ACUTE ONLY) 0902  Mobility Specialist Time Calculation (min) (ACUTE ONLY) 12 min   Pt received in bed agreeable to mobiliyt. MinA required for bed mobility and STS. Pt also needed mod VC for redirection. Was able to take a couple steps towards the chair w/o fault. Left in chair w/ call bell and personal belongings in reach. All needs met. Chair alarm on.  Thersia Minder Mobility Specialist  Please contact vis Secure Chat or  Rehab Office [phone]

## 2024-01-05 ENCOUNTER — Other Ambulatory Visit (HOSPITAL_COMMUNITY): Payer: Self-pay

## 2024-01-05 DIAGNOSIS — I10 Essential (primary) hypertension: Secondary | ICD-10-CM | POA: Diagnosis not present

## 2024-01-05 DIAGNOSIS — F411 Generalized anxiety disorder: Secondary | ICD-10-CM | POA: Diagnosis not present

## 2024-01-05 DIAGNOSIS — N185 Chronic kidney disease, stage 5: Secondary | ICD-10-CM | POA: Diagnosis not present

## 2024-01-05 DIAGNOSIS — I5033 Acute on chronic diastolic (congestive) heart failure: Secondary | ICD-10-CM | POA: Diagnosis not present

## 2024-01-05 MED ORDER — TORSEMIDE 20 MG PO TABS
80.0000 mg | ORAL_TABLET | Freq: Two times a day (BID) | ORAL | 0 refills | Status: AC
  Filled 2024-01-05: qty 240, 30d supply, fill #0

## 2024-01-05 MED ORDER — SEVELAMER CARBONATE 800 MG PO TABS
800.0000 mg | ORAL_TABLET | Freq: Three times a day (TID) | ORAL | 0 refills | Status: AC
  Filled 2024-01-05: qty 90, 30d supply, fill #0

## 2024-01-05 MED ORDER — LORAZEPAM 0.5 MG PO TABS
0.5000 mg | ORAL_TABLET | Freq: Two times a day (BID) | ORAL | 0 refills | Status: DC | PRN
Start: 2024-01-05 — End: 2024-02-06
  Filled 2024-01-05: qty 10, 5d supply, fill #0

## 2024-01-05 MED ORDER — MIRTAZAPINE 7.5 MG PO TABS
7.5000 mg | ORAL_TABLET | Freq: Every evening | ORAL | 0 refills | Status: AC | PRN
  Filled 2024-01-05: qty 30, 30d supply, fill #0

## 2024-01-05 MED ORDER — SODIUM BICARBONATE 650 MG PO TABS: 650.0000 mg | ORAL_TABLET | Freq: Two times a day (BID) | ORAL | 0 refills | Status: AC

## 2024-01-05 NOTE — Discharge Summary (Addendum)
 Physician Discharge Summary   Patient: [name] MRN: [id number] DOB: 1933-07-01  Admit date:     12/28/2023  Discharge date: 01/05/24  Discharge Physician: Elidia Sieving Glennda Weatherholtz   PCP: Charlett Apolinar POUR, MD   Recommendations at discharge:    Patient is being discharge home with home hospice services, palliative diuresis with torsemide , dose increased to 80 mg po bid. Patient with very poor prognosis not candidate for renal replacement therapy.  Follow up with Dr Charlett in 7 to 10 days as outpatient.   I spoke with patient's granddaughter over the phone, we talked in detail about patient's condition, plan of care and prognosis and all questions were addressed.  Discharge Diagnoses: Principal Problem:   Acute on chronic diastolic CHF (congestive heart failure) (HCC) Active Problems:   HTN (hypertension)   CKD (chronic kidney disease) stage 5, GFR less than 15 ml/min (HCC)   Anxiety state   History of DVT (deep vein thrombosis)   Mild cognitive impairment  Resolved Problems:   * No resolved hospital problems. [facility] Course: [name] was admitted to the hospital with the working diagnosis of volume overload due to worsening renal failure  89 y.o. female with past medical history of CKD stage V, hypertension, asthma, anemia of chronic disease, heart failure, anxiety, GERD, hypertension, and hyperlipidemia who presented with dyspnea and a mechanical fall. Recent hospitalization 07/20 to 12/10/23 for volume overload in the setting of advance renal disease and heart failure. She was discharged on torsemide  40 mg daily. Decision was made not to undertake renal replacement therapy.  This time reported worsening dyspnea for the last 2 days prior to presentation. On day of hospitalization she sustained a mechanical fall while using the bathroom. EMS was called and she was found with 02 saturation of 87% on room air.   In the ED her blood pressure was 186/72, HR 86, RR 31 and  02 saturation 94% on supplemental 02 per [place]  Lungs with bilateral rales, heart with S1 and S2 present and regular with no gallops or rubs, abdomen with no distention and positive lower extremity edema.   Na 138, K 5,6 Cl 112 bicarbonate 18 glucose 119 bun 81 cr 6.0  AST 18 ALT < 5 BNP 1,010  High sensitive troponin 109 and 97 Wbc 7,4 hgb 8,7 plt 246   Sars covid 19 negative Influenza negative RSV negative    CT head maxillofacial with no acute intracranial abnormalities.  Left parietal scalp hematoma   Chest radiograph with cardiomegaly with bilateral upper lobes interstitial infiltrates.  No effusions.   EKG 96 bpm, normal axis, normal intervals, qtc 462, sinus rhythm with q wave V1 and V2, with no significant ST segment or T wave changes. Positive LVH.   Patient was placed on palliative diuresis   08/21 pending family meeting with hospice. Possible home  hospice services.  08/22 patient with no significant improvement in volume status despite aggressive IV diuresis. Her family has decided to continue care  under home hospice services.  08/23 patient stable for transfer to home with home hospice services.   Assessment and Plan: * Acute on chronic diastolic CHF (congestive heart failure) (HCC) Echocardiogram with mildly reduced LV systolic function 45 to 50%, global hypokinesis, mild dilatated LV cavity, severe LVH, RV systolic function preserved, LA and RA with moderate dilatation, mild mitral valve regurgitation,   Patient not responded to IV diuresis with furosemide , decision was made to continue care under hospice services.  Palliative po  diuresis with torsemide  80 mg bid   Limited medical therapy due to reduced GFR.  Follow up with hospice services.   HTN (hypertension) Continue blood pressure control with amlodipine , carvedilol  and hydralazine    CKD (chronic kidney disease) stage 5, GFR less than 15 ml/min (HCC) Hyperkalemia, azotemia/ not uremia   Worsening renal  function with serum cr at 6,43 with K at 4.9 and serum bicarbonate at 22 BUN 78 Na 137 P 6.7   Continue palliative diuresis with oral torsemide   Follow up with palliative care services.  Patient is not candidate for renal replacement therapy due to heart failure, deconditioning and frailty   Continue with oral sodium bicarbonate    Anemia of chronic renal disease.   Metabolic bone disease, continue with sevelamer .   Anxiety state Continue mirtazapine , citalopram  and as needed lorazepam .   History of DVT (deep vein thrombosis) Not on full anticoagulation   Mild cognitive impairment Supportive medical care.   Asthma, chronic No signs of acute exacerbation, plan to continue bronchodilator therapy LAMA, LABA and ICS.   Consultants: palliative care  Procedures performed: none   Disposition: Home Diet recommendation:  Regular diet DISCHARGE MEDICATION: Allergies as of 01/05/2024       Reactions   Penicillins Hives   Asa [aspirin] Other (See Comments)   Wheezing Acetaminophen  is OK   Fish Allergy Itching, Nausea And Vomiting, Swelling   Swelling of the face        Medication List     STOP taking these medications    AeroChamber Plus inhaler   doxazosin  2 MG tablet Commonly known as: CARDURA    ferrous sulfate  325 (65 FE) MG EC tablet       TAKE these medications    acetaminophen  325 MG tablet Commonly known as: TYLENOL  Take 2 tablets (650 mg total) by mouth every 6 (six) hours as needed for mild pain (pain score 1-3) (or Fever >/= 101).   albuterol  (2.5 MG/3ML) 0.083% nebulizer solution Commonly known as: PROVENTIL  Take 3 mLs (2.5 mg total) by nebulization every 6 (six) hours as needed for shortness of breath or wheezing. What changed: Another medication with the same name was changed. Make sure you understand how and when to take each.   albuterol  108 (90 Base) MCG/ACT inhaler Commonly known as: VENTOLIN  HFA USE 2 PUFFS EVERY 6 HOURS AS NEEDED FOR  WHEEZING. What changed: See the new instructions.   amLODipine  10 MG tablet Commonly known as: NORVASC  Take 1 tablet (10 mg total) by mouth daily. What changed: when to take this   Breztri  Aerosphere 160-9-4.8 MCG/ACT Aero inhaler Generic drug: budesonide -glycopyrrolate -formoterol  INHALE 2 PUFFS INTO THE LUNGS TWICE DAILY, IN THE MORNING & AT BEDTIME.   Calamine Plus 1-8 % Lotn Generic drug: diphenhydramine -calamine On lesion  bid as needed   carvedilol  12.5 MG tablet Commonly known as: COREG  Take 1 tablet (12.5 mg total) by mouth 2 (two) times daily.   citalopram  20 MG tablet Commonly known as: CELEXA  Take 0.5 tablets (10 mg total) by mouth daily.   cyanocobalamin  1000 MCG tablet Commonly known as: VITAMIN B12 Take 1,000 mcg by mouth daily.   feeding supplement (OSMOLITE 1.2 CAL) Liqd Take 237 mLs by mouth 2 (two) times daily between meals.   fluticasone  50 MCG/ACT nasal spray Commonly known as: FLONASE  Place 2 sprays into both nostrils as needed for allergies or rhinitis.   GenTeal Tears 0.1-0.2-0.3 % Soln Place 2 drops into both eyes as needed.   hydrALAZINE  100 MG tablet Commonly  known as: APRESOLINE  Take 1 tablet (100 mg total) by mouth 3 (three) times daily.   lidocaine  5 % Commonly known as: LIDODERM  Place 1 patch onto the skin daily. Remove & Discard patch within 12 hours or as directed by MD   LORazepam  0.5 MG tablet Commonly known as: ATIVAN  Take 1 tablet (0.5 mg total) by mouth 2 (two) times daily as needed. for anxiety   mirtazapine  7.5 MG tablet Commonly known as: REMERON  Take 1 tablet (7.5 mg total) by mouth at bedtime as needed.   montelukast  10 MG tablet Commonly known as: SINGULAIR  Take 1 tablet (10 mg total) by mouth daily.   sevelamer  carbonate 800 MG tablet Commonly known as: RENVELA  Take 1 tablet (800 mg total) by mouth 3 (three) times daily with meals.   sodium bicarbonate  650 MG tablet Take 1 tablet (650 mg total) by mouth 2 (two)  times daily.   torsemide  20 MG tablet Commonly known as: DEMADEX  Take 4 tablets (80 mg total) by mouth 2 (two) times daily. What changed:  how much to take when to take this   Vitamin D  (Cholecalciferol ) 25 MCG (1000 UT) Tabs Take 1,000 Units by mouth daily.        Follow-up Information     [facility] Follow up.   Why: outpatient palliative services Contact information: [address] [place]  [address] [phone]        [facility] Follow up.   Specialty: Hospice and Palliative Medicine Why: home hospice Contact information: [address] [facility] [place]  [address] [phone]               Discharge Exam: Filed Weights   01/03/24 0433 01/04/24 0311 01/05/24 0344  Weight: 64.6 kg 63.4 kg 61.3 kg   BP (!) 154/53 (BP Location: Right Arm)   Pulse 64   Temp 98 F (36.7 C) (Oral)   Resp 17   Wt 61.3 kg   SpO2 95%   BMI 23.94 kg/m   Patient is feeling stable with no worsening dyspnea, no chest pain, no nausea or vomiting.   Neurology awake and alert ENT with mild pallor Cardiovascular with S1 and S2 present and regular, positive systolic murmur at the right lower sternal border Respiratory with mild rales at bases with no wheezing or rhonchi, poor inspiratory effort Abdomen with no distention, soft and non tender Positive lower extremity edema ++   Condition at discharge: stable  The results of significant diagnostics from this hospitalization (including imaging, microbiology, ancillary and laboratory) are listed below for reference.   Imaging Studies: CT Maxillofacial Wo Contrast Result Date: 12/28/2023 EXAM: CT Face without contrast 12/28/2023 10:09:20 AM TECHNIQUE: CT of the face was performed without the administration of intravenous contrast. Multiplanar reformatted images are provided for review. Automated exposure control, iterative reconstruction, and/or weight based adjustment of the mA/kV was  utilized to reduce the radiation dose to as low as reasonably achievable. COMPARISON: None available CLINICAL HISTORY: Facial trauma, blunt. FINDINGS: AERODIGESTIVE TRACT: No mass. No edema. SALIVARY GLANDS: No acute abnormality. LYMPH NODES: No suspicious cervical lymphadenopathy. SOFT TISSUES: No mass or fluid collection. BRAIN, ORBITS AND SINUSES: No acute abnormality. Patient is status post bilateral lens replacement. BONES: No acute abnormality. No suspicious bone lesion. The patient is edentulous. LIMITATIONS/ARTIFACTS: There is mild motion artifact. IMPRESSION: 1. No acute abnormality of the face related to blunt facial trauma. Electronically signed by: evalene coho 12/28/2023 10:48 AM EDT RP Workstation: [id number]   CT Head Wo Contrast Result  Date: 12/28/2023 EXAM: CT HEAD WITHOUT CONTRAST 12/28/2023 10:09:20 AM TECHNIQUE: CT of the head was performed without the administration of intravenous contrast. Automated exposure control, iterative reconstruction, and/or weight based adjustment of the mA/kV was utilized to reduce the radiation dose to as low as reasonably achievable. COMPARISON: CT of the head dated 10/17/2023. CLINICAL HISTORY: Head trauma, minor (Age >= 65y). FINDINGS: BRAIN AND VENTRICLES: No acute hemorrhage. Gray-white differentiation is preserved. No hydrocephalus. No extra-axial collection. No mass effect or midline shift. Age-related atrophy and mild periventricular white matter disease. ORBITS: The patient is status post bilateral lens replacement and right scleral banding. SINUSES: No acute abnormality. SOFT TISSUES AND SKULL: There is a left parietal scalp hematoma. No skull fracture. IMPRESSION: 1. No acute intracranial abnormality. 2. Left parietal scalp hematoma. Electronically signed by: evalene coho 12/28/2023 10:45 AM EDT RP Workstation: [id number]   DG Chest Port 1 View Result Date: 12/28/2023 CLINICAL DATA:  dyspnea ho COPD CHF EXAM: PORTABLE CHEST - 1 VIEW  COMPARISON:  December 02, 2023 FINDINGS: Interval development of patchy airspace opacities in both upper lung zones. Probable trace right pleural effusion. No pneumothorax. Moderate cardiomegaly. Tortuous aorta with aortic atherosclerosis. No acute fracture or destructive lesions. Multilevel thoracic osteophytosis. IMPRESSION: Interval development of patchy airspace opacities in both upper lung zones, worrisome for a multifocal pneumonia. Probable trace right pleural effusion. Electronically Signed   By: Rogelia Myers M.D.   On: 12/28/2023 10:03    Microbiology: Results for orders placed or performed during the hospital encounter of 12/28/23  Culture, blood (single)     Status: None   Collection Time: 12/28/23  9:29 AM   Specimen: BLOOD RIGHT HAND  Result Value Ref Range Status   Specimen Description BLOOD RIGHT HAND  Final   Special Requests   Final    BOTTLES DRAWN AEROBIC AND ANAEROBIC Blood Culture results may not be optimal due to an inadequate volume of blood received in culture bottles   Culture   Final    NO GROWTH 5 DAYS Performed at [facility], [address] [place], [place] [address]    Report Status 01/02/2024 FINAL  Final  Resp panel by RT-PCR (RSV, Flu A&B, Covid) Anterior Nasal Swab     Status: None   Collection Time: 12/28/23  1:12 PM   Specimen: Anterior Nasal Swab  Result Value Ref Range Status   SARS Coronavirus 2 by RT PCR NEGATIVE NEGATIVE Final   Influenza A by PCR NEGATIVE NEGATIVE Final   Influenza B by PCR NEGATIVE NEGATIVE Final    Comment: (NOTE) The Xpert Xpress SARS-CoV-2/FLU/RSV plus assay is intended as an aid in the diagnosis of influenza from Nasopharyngeal swab specimens and should not be used as a sole basis for treatment. Nasal washings and aspirates are unacceptable for Xpert Xpress SARS-CoV-2/FLU/RSV testing.  Fact Sheet for Patients: BloggerCourse.com  Fact Sheet for Healthcare  Providers: SeriousBroker.it  This test is not yet approved or cleared by the [place]  FDA and has been authorized for detection and/or diagnosis of SARS-CoV-2 by FDA under an Emergency Use Authorization (EUA). This EUA will remain in effect (meaning this test can be used) for the duration of the COVID-19 declaration under Section 564(b)(1) of the Act, 21 U.S.C. section 360bbb-3(b)(1), unless the authorization is terminated or revoked.     Resp Syncytial Virus by PCR NEGATIVE NEGATIVE Final    Comment: (NOTE) Fact Sheet for Patients: BloggerCourse.com  Fact Sheet for Healthcare Providers: SeriousBroker.it  This test is not  yet approved or cleared by the [place]  FDA and has been authorized for detection and/or diagnosis of SARS-CoV-2 by FDA under an Emergency Use Authorization (EUA). This EUA will remain in effect (meaning this test can be used) for the duration of the COVID-19 declaration under Section 564(b)(1) of the Act, 21 U.S.C. section 360bbb-3(b)(1), unless the authorization is terminated or revoked.  Performed at [facility], [address] [place], [place] [address]    *Note: Due to a large number of results and/or encounters for the requested time period, some results have not been displayed. A complete set of results can be found in Results Review.    Labs: CBC: Recent Labs  Lab 12/30/23 0232  WBC 5.4  HGB 8.0*  HCT 26.3*  MCV 99.6  PLT 264   Basic Metabolic Panel: Recent Labs  Lab 12/30/23 0232 12/31/23 0326 01/01/24 0300 01/02/24 0241 01/03/24 0242  NA 139 139 139 137 137  K 5.1 5.3* 5.3* 5.0 4.9  CL 111 108 105 104 103  CO2 18* 20* 22 21* 22  GLUCOSE 112* 105* 110* 104* 98  BUN 77* 79* 78* 78* 78*  CREATININE 6.33* 6.12* 6.31* 6.37* 6.43*  CALCIUM  8.2* 8.1* 8.2* 8.0* 8.2*  PHOS  --   --   --   --  6.7*   Liver Function Tests: Recent Labs  Lab  01/03/24 0242  ALBUMIN  2.0*   CBG: Recent Labs  Lab 12/30/23 2324  GLUCAP 137*    Discharge time spent: greater than 30 minutes.  Signed: Elidia Toribio Furnace, MD [facility] 01/05/2024

## 2024-01-05 NOTE — TOC Transition Note (Signed)
 Transition of Care [facility]) - Discharge Note   Patient Details  Name: [name] MRN: [id number] Date of Birth: 10-May-1934  Transition of Care [facility]) CM/SW Contact:  Marval Gell, RN Phone Number: 01/05/2024, 11:26 AM   Clinical Narrative:     Patient to DC home by private car to Home hospice w [facility], nurse is set up to see patient at 4pm per today's liaison Nat.  Once [facility] has delivered O2 to room for transport, patient may go.  Nurse aware.   Final next level of care: Home w Hospice Care Barriers to Discharge: No Barriers Identified   Patient Goals and CMS Choice Patient states their goals for this hospitalization and ongoing recovery are:: return home with daughter CMS Medicare.gov Compare Post Acute Care list provided to:: Patient Represenative (must comment) Choice offered to / list presented to : Adult Children      Discharge Placement                       Discharge Plan and Services Additional resources added to the After Visit Summary for     Discharge Planning Services: CM Consult Post Acute Care Choice: Home Health          DME Arranged: Oxygen DME Agency: [facility] Date DME Agency Contacted: 01/05/24 Time DME Agency Contacted: [id number] Representative spoke with at DME Agency: Eleanor HH Arranged: RN, Disease Management, PT, OT HH Agency: [facility] Date [facility] Agency Contacted: 12/31/23 Time HH Agency Contacted: 1630 Representative spoke with at [facility] Agency: [place]  Social Drivers of Health (SDOH) Interventions SDOH Screenings   Food Insecurity: No Food Insecurity (12/28/2023)  Housing: Low Risk  (12/28/2023)  Transportation Needs: No Transportation Needs (12/28/2023)  Utilities: Not At Risk (12/28/2023)  Alcohol  Screen: Low Risk  (09/21/2023)  Depression (PHQ2-9): Low Risk  (09/21/2023)  Financial Resource Strain: Low Risk  (09/21/2023)  Physical Activity: Sufficiently Active (09/21/2023)  Social Connections:  Moderately Isolated (12/28/2023)  Stress: No Stress Concern Present (09/21/2023)  Tobacco Use: Low Risk  (12/28/2023)  Health Literacy: Adequate Health Literacy (09/21/2023)     Readmission Risk Interventions    12/31/2023    4:26 PM 12/03/2023   11:27 AM 10/24/2023    2:20 PM  Readmission Risk Prevention Plan  Transportation Screening Complete Complete Complete  Medication Review Oceanographer) Complete Complete Complete  PCP or Specialist appointment within 3-5 days of discharge  Complete Complete  HRI or Home Care Consult Complete Complete Complete  SW Recovery Care/Counseling Consult   Complete  Palliative Care Screening Complete Not Applicable   Skilled Nursing Facility Not Applicable Not Applicable Complete

## 2024-01-05 NOTE — Plan of Care (Signed)
  Problem: Clinical Measurements: Goal: Will remain free from infection Outcome: Progressing Goal: Respiratory complications will improve Outcome: Progressing Goal: Cardiovascular complication will be avoided Outcome: Progressing   Problem: Elimination: Goal: Will not experience complications related to urinary retention Outcome: Progressing   Problem: Safety: Goal: Ability to remain free from injury will improve Outcome: Progressing   

## 2024-01-05 NOTE — Assessment & Plan Note (Signed)
 No signs of acute exacerbation, plan to continue bronchodilator therapy LAMA, LABA and ICS.

## 2024-01-09 ENCOUNTER — Telehealth: Payer: Self-pay

## 2024-01-09 ENCOUNTER — Inpatient Hospital Stay: Admitting: Internal Medicine

## 2024-01-09 NOTE — Telephone Encounter (Signed)
 Attempted to pt regarding to office visit. Left voicemail to call us  back.   Per dr Charlett okay to change to visit to virtual.

## 2024-01-09 NOTE — Progress Notes (Deleted)
 No chief complaint on file.   HPI: [name] 89 y.o. come in for Chronic disease management  Hosp 7 20 - 28   Patient: [name] MRN: [id number] DOB: Sep 14, 1933  Admit date:     12/02/2023  Discharge date: 12/10/23  Discharge Physician: [name]    PCP: Angela Apolinar POUR, MD    Recommendations at discharge:    Patient was discharged home on several medication changes. Torsemide  switched from twice daily to daily. She was started on Remeron  while in house to help with appetite Started on doxazosin  and clonidine  stopped due to labile blood pressures.  She should take the doxazosin  at night.  This was started by nephrology team. She was started on sevelamer  3 times daily with meals to help with hyperphosphatemia. She was discharged home with home hospice.  Family told that hospice would reach out to her. She should have follow-up with PCP within the week to ensure hospice is following with her.   ROS: See pertinent positives and negatives per HPI.  Past Medical History:  Diagnosis Date   Acute exacerbation of moderate persistent extrinsic asthma 08/08/2023   Acute kidney injury superimposed on stage 5 chronic kidney disease, not on chronic dialysis (HCC) 08/08/2023   Acute superficial venous thrombosis of left lower extremity 03/27/2014   concern about hx and potential extensive on exam tenderness calf and low dose lovenox  40 qd 2-4 weekselevetion and close  fu advised .    Allergy    Anemia    Anxiety    Arthritis    shoulders (09/09/2012)   Asthma 09/08/2012   Bereavement due to life event 04/21/2013   Anniversary daughter's death, brother new diagnosis of cancer     CAP (community acquired pneumonia) 10/31/2022   Carotid bruit    us  2018  low risk 1 - 39%   Cataract    bil cateracts removed   CKD (chronic kidney disease) stage 5, GFR less than 15 ml/min (HCC)    as of may 2025, creat 4.5- 5.5 range   Clotting disorder (HCC)    blood clots in  legs   Colon cancer screening March 02, 2013   Death of family member Sep 11, 2013   Diabetes mellitus without complication (HCC)    Borderline per pt; being monitored.  no meds per pt   Diverticulosis    Dyspnea 04/27/2014   Elevated uric acid in blood 04/13/2014   Fluid overload 12/02/2023   GERD (gastroesophageal reflux disease)    Headache(784.0)    related to my high blood pressure (09/09/2012)   Hiatal hernia    History of DVT (deep vein thrombosis)    RLE (09/09/2012) coumadine cant take asa so on plavix     HTN (hypertension) 09/08/2012   Hyperlipidemia    Hypothermia 10/15/2023   Lupus    cured years ago (09/09/2012)   Other dysphagia 02-Mar-2013   Palpitations 01/05/2014   Sepsis (HCC) 10/15/2023   Syncope and collapse 01/21/2018   Varicose veins    Varicose veins of leg with complications 12/05/2010    Family History  Problem Relation Age of Onset   Hypertension Mother    Lung cancer Mother    Hypertension Father    Lung cancer Father    Breast cancer Sister    Colon cancer Sister        ? 35' s dx - died in 68's   Breast cancer Sister    Hypertension Brother    Rectal cancer Brother  Stomach cancer Brother    Breast cancer Brother    Heart attack Daughter    Esophageal cancer Daughter    Hypertension Daughter    Diabetes Daughter    Breast cancer Paternal Aunt    Lung cancer Other        both parents    Pancreatic cancer Neg Hx     Social History   Socioeconomic History   Marital status: Single    Spouse name: Not on file   Number of children: 6   Years of education: 9   Highest education level: Not on file  Occupational History   Occupation: retired  Tobacco Use   Smoking status: Never   Smokeless tobacco: Never  Vaping Use   Vaping status: Never Used  Substance and Sexual Activity   Alcohol  use: No   Drug use: No   Sexual activity: Not Currently    Birth control/protection: Surgical  Other Topics Concern   Not on file  Social  History Narrative   2 people living in the home.  Grand daughter.Up and down through the nightHad 7 children  2 deceased . 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  Neg tad    Left handed   Drinks caffeine prn    Granddaughter lives with her   One floor home   Social Drivers of Health   Financial Resource Strain: Low Risk  (09/21/2023)   Overall Financial Resource Strain (CARDIA)    Difficulty of Paying Living Expenses: Not hard at all  Food Insecurity: No Food Insecurity (12/28/2023)   Hunger Vital Sign    Worried About Running Out of Food in the Last Year: Never true    Ran Out of Food in the Last Year: Never true  Transportation Needs: No Transportation Needs (12/28/2023)   PRAPARE - Administrator, Civil Service (Medical): No    Lack of Transportation (Non-Medical): No  Physical Activity: Sufficiently Active (09/21/2023)   Exercise Vital Sign    Days of Exercise per Week: 5 days    Minutes of Exercise per Session: 30 min  Stress: No Stress Concern Present (09/21/2023)   [employer] of Occupational Health - Occupational Stress Questionnaire    Feeling of Stress : Not at all  Social Connections: Moderately Isolated (12/28/2023)   Social Connection and Isolation Panel    Frequency of Communication with Friends and Family: Three times a week    Frequency of Social Gatherings with Friends and Family: More than three times a week    Attends Religious Services: More than 4 times per year    Active Member of Clubs or Organizations: No    Attends Banker Meetings: Never    Marital Status: Widowed    Outpatient Medications Prior to Visit  Medication Sig Dispense Refill   acetaminophen  (TYLENOL ) 325 MG tablet Take 2 tablets (650 mg total) by mouth every 6 (six) hours as needed for mild pain (pain score 1-3) (or Fever >/= 101). 20 tablet 0    albuterol  (PROVENTIL ) (2.5 MG/3ML) 0.083% nebulizer solution Take 3 mLs (2.5 mg total) by nebulization every 6 (six) hours as needed for shortness of breath or wheezing. 360 mL 6   albuterol  (VENTOLIN  HFA) 108 (90 Base) MCG/ACT inhaler USE 2 PUFFS EVERY 6 HOURS AS NEEDED FOR WHEEZING. (Patient taking differently: Inhale 2 puffs into the lungs  every 6 (six) hours as needed for wheezing or shortness of breath. USE 2 PUFFS EVERY 6 HOURS AS NEEDED FOR WHEEZING.) 8.5 g 3   amLODipine  (NORVASC ) 10 MG tablet Take 1 tablet (10 mg total) by mouth daily. (Patient taking differently: Take 10 mg by mouth in the morning.) 30 tablet 1   Artificial Tear Solution (GENTEAL TEARS) 0.1-0.2-0.3 % SOLN Place 2 drops into both eyes as needed. 15 mL 0   Budeson-Glycopyrrol-Formoterol  (BREZTRI  AEROSPHERE) 160-9-4.8 MCG/ACT AERO INHALE 2 PUFFS INTO THE LUNGS TWICE DAILY, IN THE MORNING & AT BEDTIME. 10.7 g 11   carvedilol  (COREG ) 12.5 MG tablet Take 1 tablet (12.5 mg total) by mouth 2 (two) times daily. 180 tablet 3   citalopram  (CELEXA ) 20 MG tablet Take 0.5 tablets (10 mg total) by mouth daily. 45 tablet 1   diphenhydramine -calamine (CALAMINE PLUS) 1-8 % LOTN On lesion  bid as needed 177 mL 0   fluticasone  (FLONASE ) 50 MCG/ACT nasal spray Place 2 sprays into both nostrils as needed for allergies or rhinitis.     hydrALAZINE  (APRESOLINE ) 100 MG tablet Take 1 tablet (100 mg total) by mouth 3 (three) times daily. 270 tablet 2   lidocaine  (LIDODERM ) 5 % Place 1 patch onto the skin daily. Remove & Discard patch within 12 hours or as directed by MD 30 patch 0   LORazepam  (ATIVAN ) 0.5 MG tablet Take 1 tablet (0.5 mg total) by mouth 2 (two) times daily as needed. for anxiety 10 tablet 0   mirtazapine  (REMERON ) 7.5 MG tablet Take 1 tablet (7.5 mg total) by mouth at bedtime as needed. 30 tablet 0   montelukast  (SINGULAIR ) 10 MG tablet Take 1 tablet (10 mg total) by mouth daily. 90 tablet 3   Nutritional Supplements (FEEDING  SUPPLEMENT, NEPRO CARB STEADY,) LIQD Take 237 mLs by mouth 2 (two) times daily between meals. 2370 mL 0   sevelamer  carbonate (RENVELA ) 800 MG tablet Take 1 tablet (800 mg total) by mouth 3 (three) times daily with meals. 90 tablet 0   sodium bicarbonate  650 MG tablet Take 1 tablet (650 mg total) by mouth 2 (two) times daily. 60 tablet 0   torsemide  (DEMADEX ) 20 MG tablet Take 4 tablets (80 mg total) by mouth 2 (two) times daily. 240 tablet 0   vitamin B-12 (CYANOCOBALAMIN ) 1000 MCG tablet Take 1,000 mcg by mouth daily.     Vitamin D , Cholecalciferol , 25 MCG (1000 UT) TABS Take 1,000 Units by mouth daily. 60 tablet 2   No facility-administered medications prior to visit.     EXAM:  There were no vitals taken for this visit.  There is no height or weight on file to calculate BMI.  GENERAL: vitals reviewed and listed above, alert, oriented, appears well hydrated and in no acute distress HEENT: atraumatic, conjunctiva  clear, no obvious abnormalities on inspection of external nose and ears OP : no lesion edema or exudate  NECK: no obvious masses on inspection palpation  LUNGS: clear to auscultation bilaterally, no wheezes, rales or rhonchi, good air movement CV: HRRR, no clubbing cyanosis or  peripheral edema nl cap refill  MS: moves all extremities without noticeable focal  abnormality PSYCH: pleasant and cooperative, no obvious depression or anxiety Lab Results  Component Value Date   WBC 5.4 12/30/2023   HGB 8.0 (L) 12/30/2023   HCT 26.3 (L) 12/30/2023   PLT 264 12/30/2023   GLUCOSE 98 01/03/2024   CHOL 190 07/21/2022   TRIG 97 07/21/2022   HDL 92  07/21/2022   LDLCALC 81 07/21/2022   ALT 6 12/29/2023   AST 13 (L) 12/29/2023   NA 137 01/03/2024   K 4.9 01/03/2024   CL 103 01/03/2024   CREATININE 6.43 (H) 01/03/2024   BUN 78 (H) 01/03/2024   CO2 22 01/03/2024   TSH 3.573 12/02/2023   INR 1.0 11/29/2023   HGBA1C 5.1 09/22/2023   MICROALBUR 119 03/02/2021   BP Readings  from Last 3 Encounters:  01/05/24 (!) 126/58  12/10/23 (!) 129/52  11/29/23 (!) 172/67    ASSESSMENT AND PLAN:  Discussed the following assessment and plan:  No diagnosis found.  -Patient advised to return or notify health care team  if  new concerns arise.  There are no Patient Instructions on file for this visit.   Nefertiti Mohamad K. Sidney Silberman M.D.

## 2024-02-04 ENCOUNTER — Other Ambulatory Visit: Payer: Self-pay | Admitting: Internal Medicine

## 2024-02-08 ENCOUNTER — Other Ambulatory Visit: Payer: Self-pay | Admitting: Emergency Medicine

## 2024-02-15 ENCOUNTER — Encounter: Payer: Self-pay | Admitting: Hematology

## 2024-02-26 ENCOUNTER — Other Ambulatory Visit: Payer: Self-pay | Admitting: Internal Medicine

## 2024-02-28 NOTE — Telephone Encounter (Signed)
 I will refill medication  Please contact patient and team about how she is doing and how often she is taking this medication Consider virtual visit if possible in  near future .

## 2024-06-06 ENCOUNTER — Telehealth: Payer: Self-pay

## 2024-06-06 NOTE — Telephone Encounter (Signed)
 FYI   Received a notification of hospice death from [facility].   Date of Admission: 01/08/2024  Date of Death: 2024-06-08  Time of Death: 10:13am  Primary Hospice Dx: Acute on Chronic Diastolic( congestive) Heart Failure.   Form place in provider's green folder.

## 2024-06-15 DEATH — deceased

## 2024-09-29 ENCOUNTER — Ambulatory Visit
